# Patient Record
Sex: Female | Born: 1960 | Race: White | Hispanic: No | Marital: Married
Health system: Southern US, Community
[De-identification: ages and names within clinical notes are randomized; demographics above are authoritative.]

## PROBLEM LIST (undated history)

## (undated) DIAGNOSIS — Z5189 Encounter for other specified aftercare: Secondary | ICD-10-CM

## (undated) DIAGNOSIS — I10 Essential (primary) hypertension: Secondary | ICD-10-CM

## (undated) DIAGNOSIS — I350 Nonrheumatic aortic (valve) stenosis: Secondary | ICD-10-CM

## (undated) DIAGNOSIS — F419 Anxiety disorder, unspecified: Secondary | ICD-10-CM

## (undated) DIAGNOSIS — C50919 Malignant neoplasm of unspecified site of unspecified female breast: Secondary | ICD-10-CM

## (undated) DIAGNOSIS — Z923 Personal history of irradiation: Secondary | ICD-10-CM

## (undated) DIAGNOSIS — M1990 Unspecified osteoarthritis, unspecified site: Secondary | ICD-10-CM

## (undated) DIAGNOSIS — G709 Myoneural disorder, unspecified: Secondary | ICD-10-CM

## (undated) DIAGNOSIS — IMO0001 Reserved for inherently not codable concepts without codable children: Secondary | ICD-10-CM

## (undated) DIAGNOSIS — I219 Acute myocardial infarction, unspecified: Secondary | ICD-10-CM

## (undated) DIAGNOSIS — D649 Anemia, unspecified: Secondary | ICD-10-CM

## (undated) DIAGNOSIS — D259 Leiomyoma of uterus, unspecified: Secondary | ICD-10-CM

## (undated) DIAGNOSIS — F329 Major depressive disorder, single episode, unspecified: Secondary | ICD-10-CM

## (undated) DIAGNOSIS — E785 Hyperlipidemia, unspecified: Secondary | ICD-10-CM

## (undated) DIAGNOSIS — I5032 Chronic diastolic (congestive) heart failure: Secondary | ICD-10-CM

## (undated) DIAGNOSIS — G2581 Restless legs syndrome: Secondary | ICD-10-CM

## (undated) DIAGNOSIS — R011 Cardiac murmur, unspecified: Secondary | ICD-10-CM

## (undated) DIAGNOSIS — F32A Depression, unspecified: Secondary | ICD-10-CM

## (undated) DIAGNOSIS — Z87442 Personal history of urinary calculi: Secondary | ICD-10-CM

## (undated) DIAGNOSIS — K219 Gastro-esophageal reflux disease without esophagitis: Secondary | ICD-10-CM

## (undated) DIAGNOSIS — Z8719 Personal history of other diseases of the digestive system: Secondary | ICD-10-CM

## (undated) DIAGNOSIS — R519 Headache, unspecified: Secondary | ICD-10-CM

## (undated) DIAGNOSIS — Z6841 Body Mass Index (BMI) 40.0 and over, adult: Secondary | ICD-10-CM

## (undated) DIAGNOSIS — I2510 Atherosclerotic heart disease of native coronary artery without angina pectoris: Secondary | ICD-10-CM

## (undated) DIAGNOSIS — I2699 Other pulmonary embolism without acute cor pulmonale: Secondary | ICD-10-CM

## (undated) DIAGNOSIS — R0600 Dyspnea, unspecified: Secondary | ICD-10-CM

## (undated) DIAGNOSIS — Z9981 Dependence on supplemental oxygen: Secondary | ICD-10-CM

## (undated) DIAGNOSIS — L309 Dermatitis, unspecified: Secondary | ICD-10-CM

## (undated) DIAGNOSIS — R531 Weakness: Secondary | ICD-10-CM

## (undated) DIAGNOSIS — J449 Chronic obstructive pulmonary disease, unspecified: Secondary | ICD-10-CM

## (undated) DIAGNOSIS — G4730 Sleep apnea, unspecified: Secondary | ICD-10-CM

## (undated) DIAGNOSIS — N200 Calculus of kidney: Secondary | ICD-10-CM

## (undated) HISTORY — PX: JOINT REPLACEMENT: SHX530

## (undated) HISTORY — DX: Depression, unspecified: F32.A

## (undated) HISTORY — DX: Chronic diastolic (congestive) heart failure: I50.32

## (undated) HISTORY — DX: Major depressive disorder, single episode, unspecified: F32.9

## (undated) HISTORY — DX: Body Mass Index (BMI) 40.0 and over, adult: Z684

## (undated) HISTORY — PX: CARDIAC VALVE REPLACEMENT: SHX585

## (undated) HISTORY — DX: Gastro-esophageal reflux disease without esophagitis: K21.9

## (undated) HISTORY — PX: CHOLECYSTECTOMY: SHX55

## (undated) HISTORY — PX: CARPAL TUNNEL RELEASE: SHX101

## (undated) HISTORY — PX: CARDIAC CATHETERIZATION: SHX172

## (undated) HISTORY — PX: TONSILLECTOMY: SUR1361

## (undated) HISTORY — DX: Atherosclerotic heart disease of native coronary artery without angina pectoris: I25.10

## (undated) HISTORY — DX: Other pulmonary embolism without acute cor pulmonale: I26.99

## (undated) HISTORY — DX: Nonrheumatic aortic (valve) stenosis: I35.0

## (undated) HISTORY — PX: CORONARY ANGIOPLASTY: SHX604

## (undated) HISTORY — DX: Hyperlipidemia, unspecified: E78.5

## (undated) HISTORY — DX: Morbid (severe) obesity due to excess calories: E66.01

## (undated) HISTORY — PX: TUBAL LIGATION: SHX77

---

## 1993-12-29 HISTORY — PX: HERNIA REPAIR: SHX51

## 2000-01-11 ENCOUNTER — Emergency Department (HOSPITAL_COMMUNITY): Admission: EM | Admit: 2000-01-11 | Discharge: 2000-01-11 | Payer: Self-pay | Admitting: Emergency Medicine

## 2000-01-11 ENCOUNTER — Encounter: Payer: Self-pay | Admitting: Emergency Medicine

## 2000-01-14 ENCOUNTER — Other Ambulatory Visit: Admission: RE | Admit: 2000-01-14 | Discharge: 2000-01-14 | Payer: Self-pay | Admitting: Obstetrics & Gynecology

## 2001-02-07 ENCOUNTER — Emergency Department (HOSPITAL_COMMUNITY): Admission: EM | Admit: 2001-02-07 | Discharge: 2001-02-07 | Payer: Self-pay

## 2001-02-07 ENCOUNTER — Encounter: Payer: Self-pay | Admitting: Emergency Medicine

## 2001-12-28 ENCOUNTER — Observation Stay (HOSPITAL_COMMUNITY): Admission: RE | Admit: 2001-12-28 | Discharge: 2001-12-29 | Payer: Self-pay | Admitting: General Surgery

## 2002-05-05 ENCOUNTER — Other Ambulatory Visit: Admission: RE | Admit: 2002-05-05 | Discharge: 2002-05-05 | Payer: Self-pay | Admitting: Obstetrics & Gynecology

## 2002-06-07 ENCOUNTER — Emergency Department (HOSPITAL_COMMUNITY): Admission: EM | Admit: 2002-06-07 | Discharge: 2002-06-07 | Payer: Self-pay | Admitting: Podiatry

## 2003-01-02 ENCOUNTER — Other Ambulatory Visit: Admission: RE | Admit: 2003-01-02 | Discharge: 2003-01-02 | Payer: Self-pay | Admitting: Obstetrics & Gynecology

## 2003-07-04 ENCOUNTER — Emergency Department (HOSPITAL_COMMUNITY): Admission: EM | Admit: 2003-07-04 | Discharge: 2003-07-04 | Payer: Self-pay | Admitting: Emergency Medicine

## 2003-08-12 ENCOUNTER — Emergency Department (HOSPITAL_COMMUNITY): Admission: EM | Admit: 2003-08-12 | Discharge: 2003-08-12 | Payer: Self-pay | Admitting: Emergency Medicine

## 2003-08-12 ENCOUNTER — Encounter: Payer: Self-pay | Admitting: *Deleted

## 2004-01-09 ENCOUNTER — Encounter: Admission: RE | Admit: 2004-01-09 | Discharge: 2004-04-08 | Payer: Self-pay | Admitting: Family Medicine

## 2004-08-23 ENCOUNTER — Emergency Department (HOSPITAL_COMMUNITY): Admission: EM | Admit: 2004-08-23 | Discharge: 2004-08-23 | Payer: Self-pay | Admitting: Emergency Medicine

## 2004-08-23 IMAGING — CT CT ANGIO CHEST
3 of 7 series · 14 of 30 positions shown · IV contrast ([ID] omni 300)
Comparison: none

CLINICAL DATA: Chest tightness and shortness of breath.  History of hypertension and asthma.
 CHEST CT ANGIO WITH CONTRAST
TECHNIQUE: Multidetector CT imaging of the chest was performed according to the protocol for detection of pulmonary embolism during IV bolus injection of 140 ml Omnipaque 300.  Coronal and sagittal plane reformatted images were also generated.

[Series 3: pe w/ lower ext · axial · 0.70mm/px · z∈[-294,-66]mm · 10 of 228 slices shown]
[im 23/228  lung]
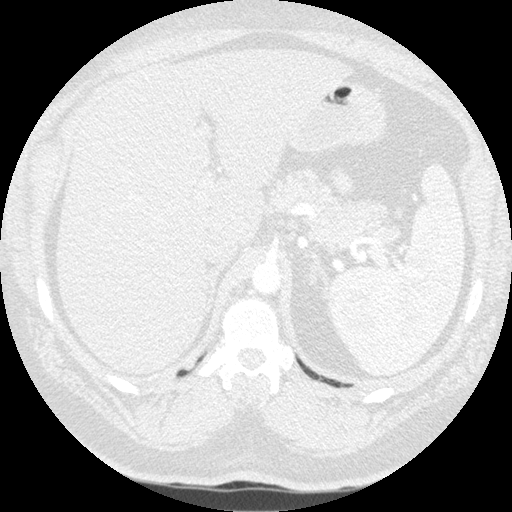
[im 46/228  mediastinal]
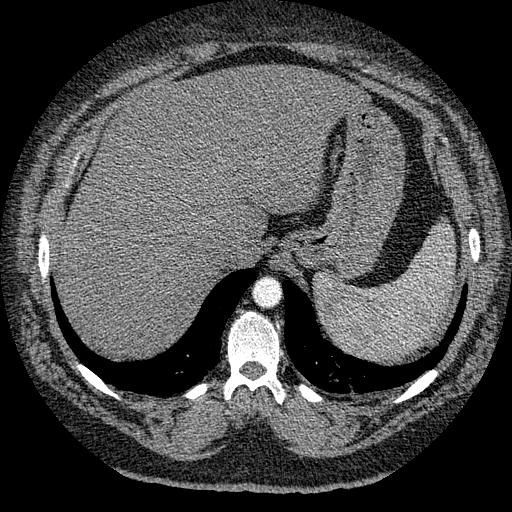
[im 69/228  lung]
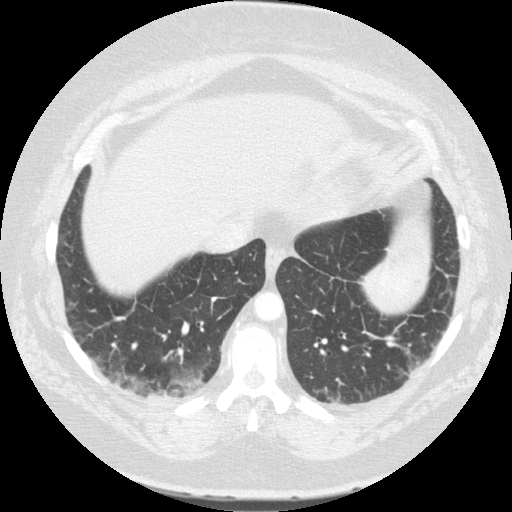
[im 91/228  mediastinal]
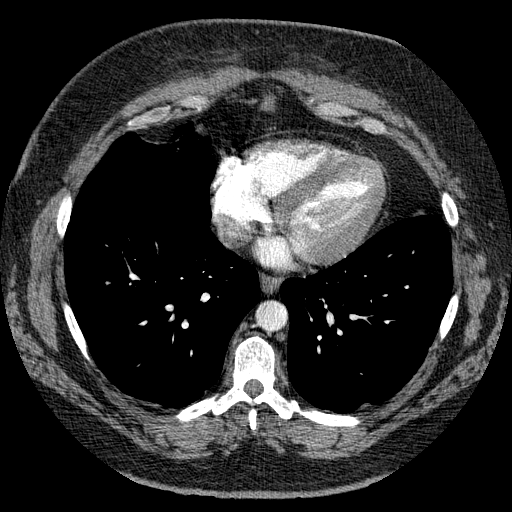
[im 114/228  lung]
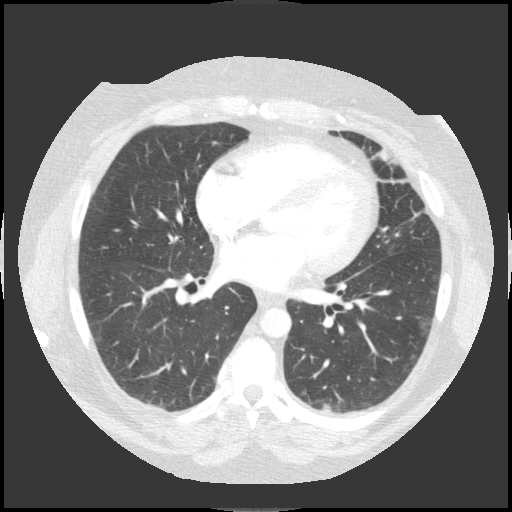
[im 122/228  mediastinal]
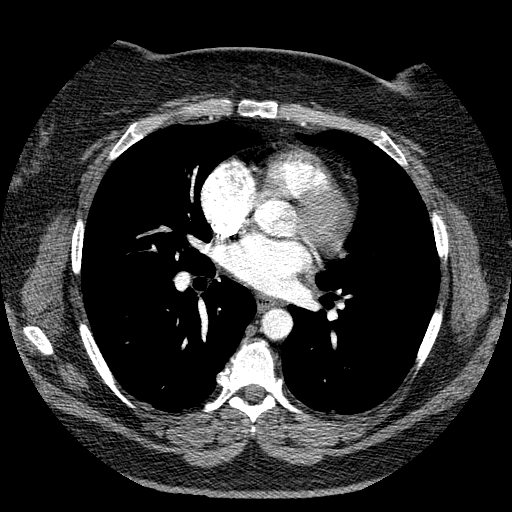
[im 137/228  lung]
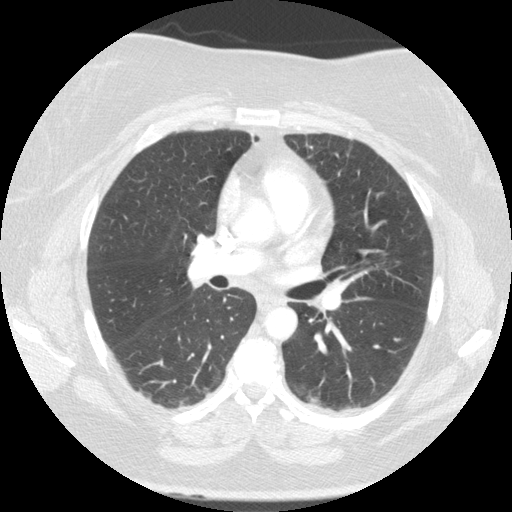
[im 159/228  mediastinal]
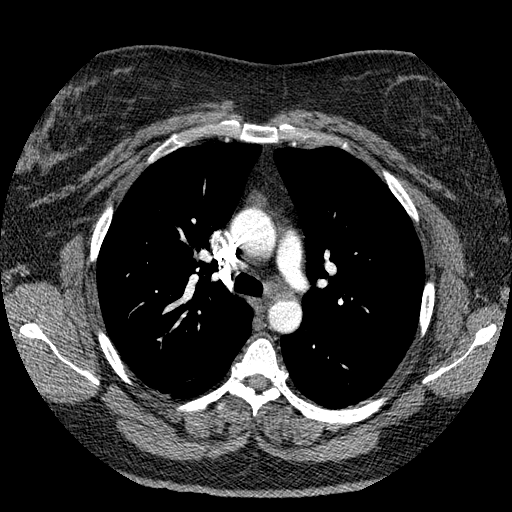
[im 182/228  lung]
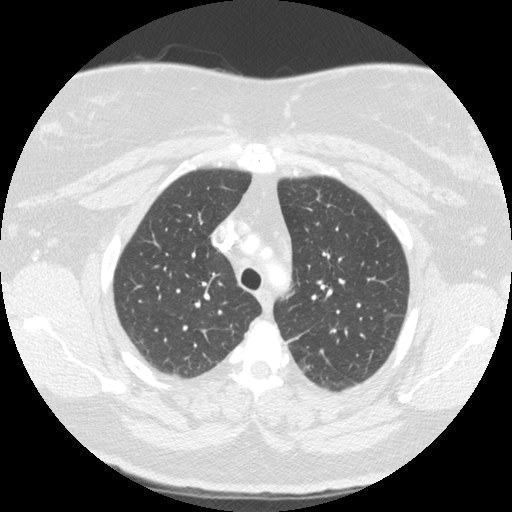
[im 205/228  mediastinal]
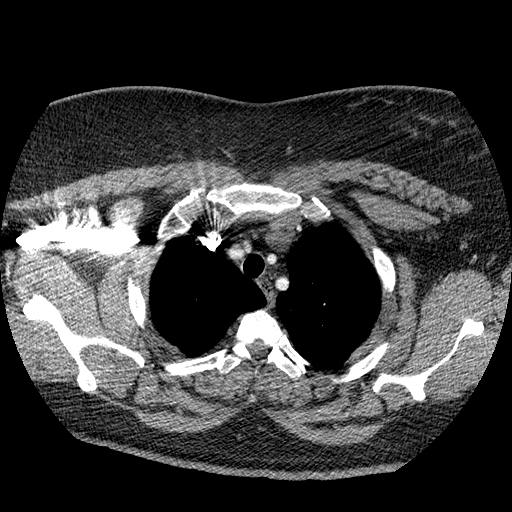

[Series 301: reformatted · sagittal · 0.70mm/px · 2 of 75 slices shown (1 of 2)]
[im 25/75  lung]
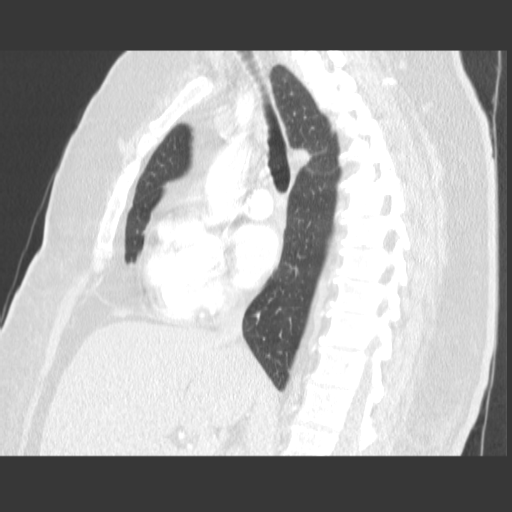
[im 50/75  lung]
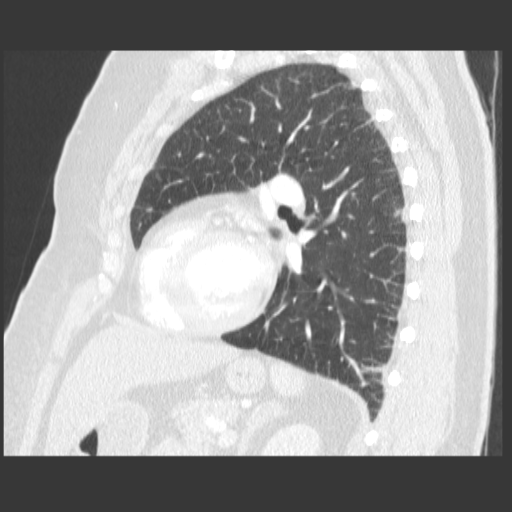

[Series 302: reformatted · coronal · 0.70mm/px · 2 of 75 slices shown (2 of 2)]
[im 25/75  lung]
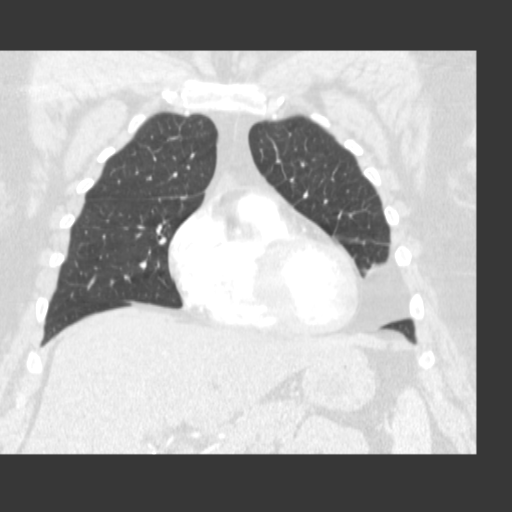
[im 50/75  lung]
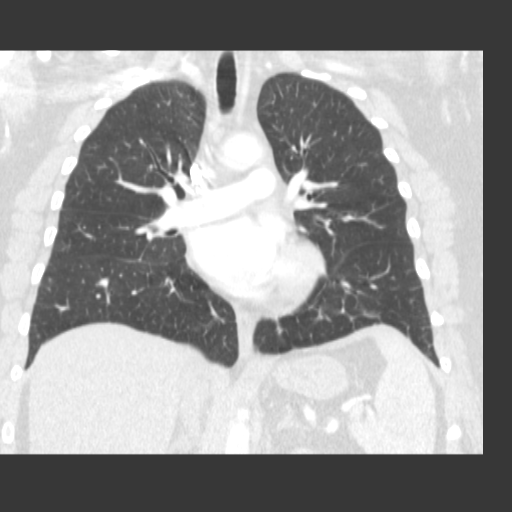

[14 of 30 positions shown; findings below may reference images not displayed]

FINDINGS: There is good contrast opacification of the pulmonary artery and its branches.  No filling defects to suggest acute PE.  There is no pleural or pericardial effusion.  Normal size prevascular and subcarinal lymph nodes.  There is minimal patchy ground-glass opacity posteriorly in both lower lobes which may represent some dependent atelectasis.  There is a focal area of atelectasis in the lingula.  The lungs are otherwise clear.  Coronal and sagittal reconstructions confirm the above findings.
 IMPRESSION
 1.  Negative for acute PE.
 2.  Dependent and lingular atelectasis.

## 2004-08-23 IMAGING — CR DG CHEST 1V PORT
1 series · 1 of 1 positions shown · non-contrast
Comparison: none

CLINICAL DATA: Shortness of breath.  
 PORTABLE CHEST [DATE] AT [G9] HOURS
 There is no previous for comparison.  Heart size upper limits normal.  Patchy subsegmental atelectasis or infiltrates in both lung bases right greater than left.  No overt interstitial edema.  No effusion is apparent. 
 IMPRESSION
 Patchy bibasilar infiltrates or atelectasis.  
 Borderline cardiomegaly.

[view not recorded]
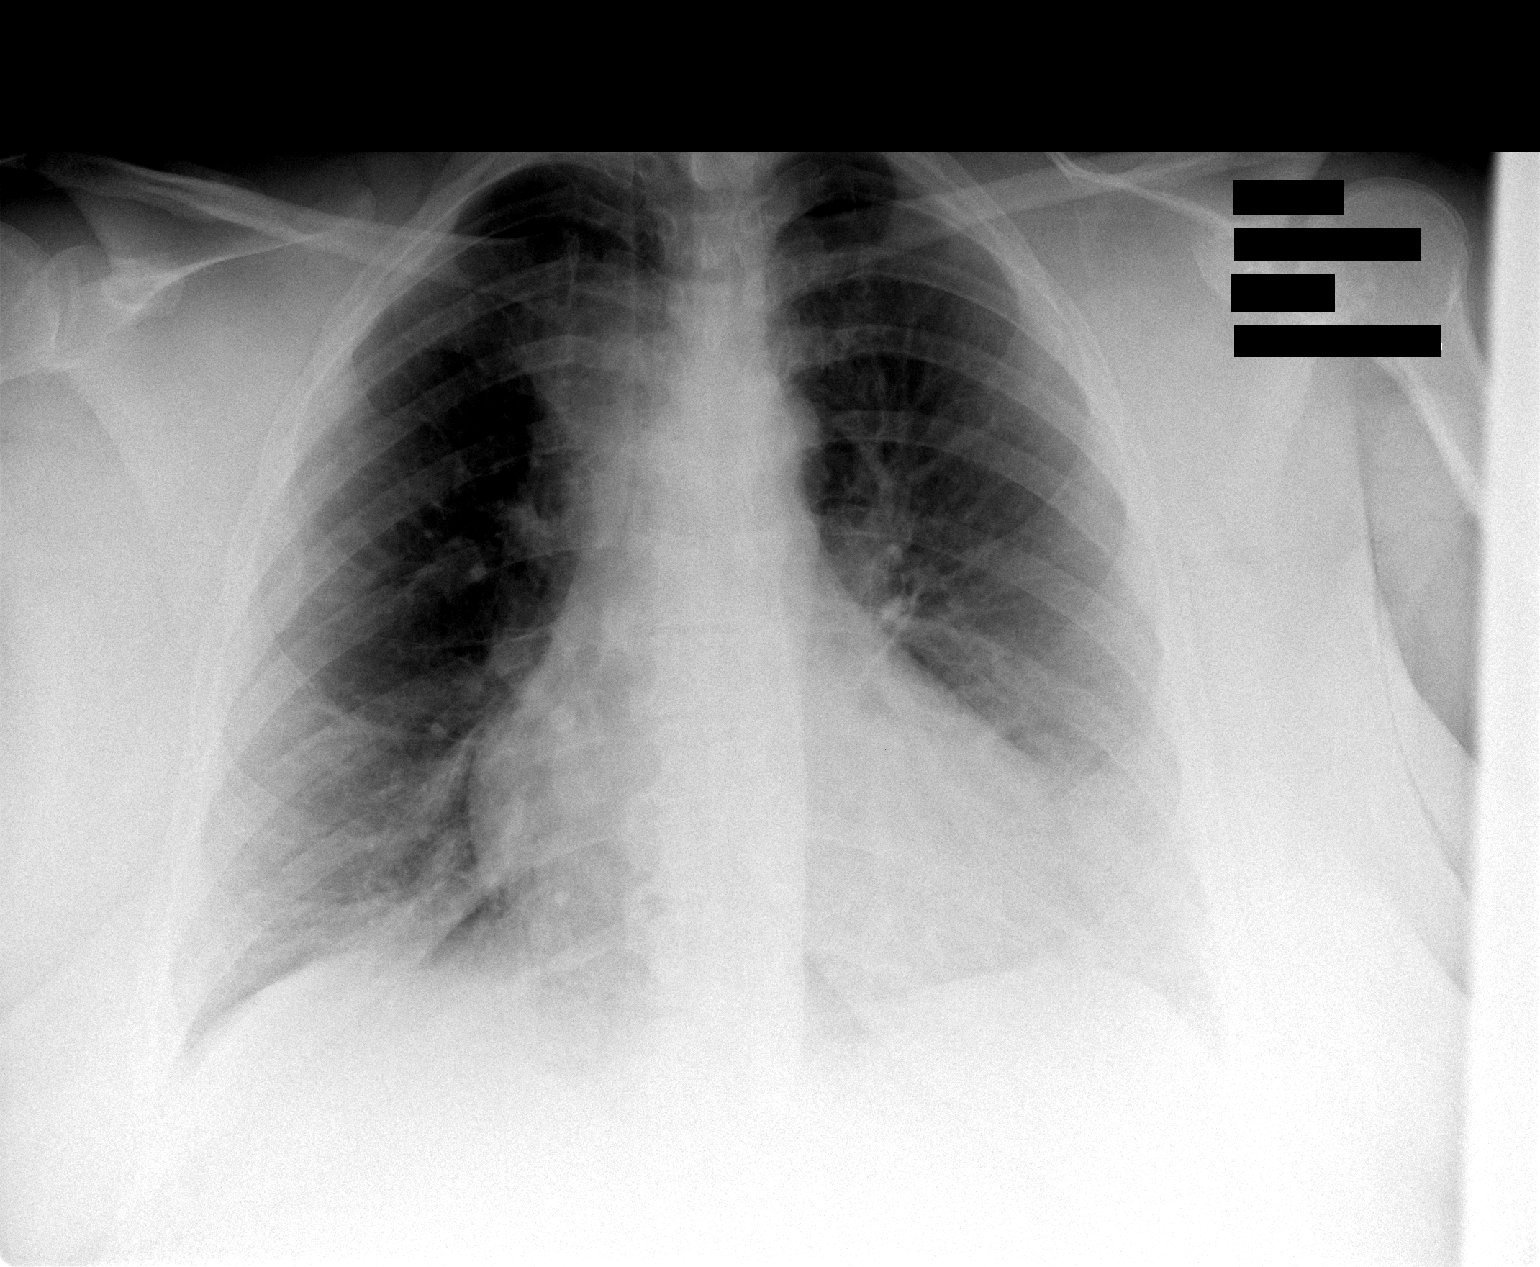

[1 of 1 positions shown; findings below may reference images not displayed]

## 2005-02-05 ENCOUNTER — Other Ambulatory Visit: Admission: RE | Admit: 2005-02-05 | Discharge: 2005-02-05 | Payer: Self-pay | Admitting: Obstetrics & Gynecology

## 2006-05-09 ENCOUNTER — Emergency Department (HOSPITAL_COMMUNITY): Admission: EM | Admit: 2006-05-09 | Discharge: 2006-05-09 | Payer: Self-pay | Admitting: Family Medicine

## 2006-05-09 IMAGING — CR DG ELBOW COMPLETE 3+V*L*
4 series · 4 of 4 positions shown · non-contrast
Comparison: none

CLINICAL DATA: Left elbow injury.  Heard a pop in arm.  Pain mid biceps down to the fingers.  Difficulty extending elbow.
LEFT ELBOW ? 4 VIEW:

[view not recorded (1 of 4)]
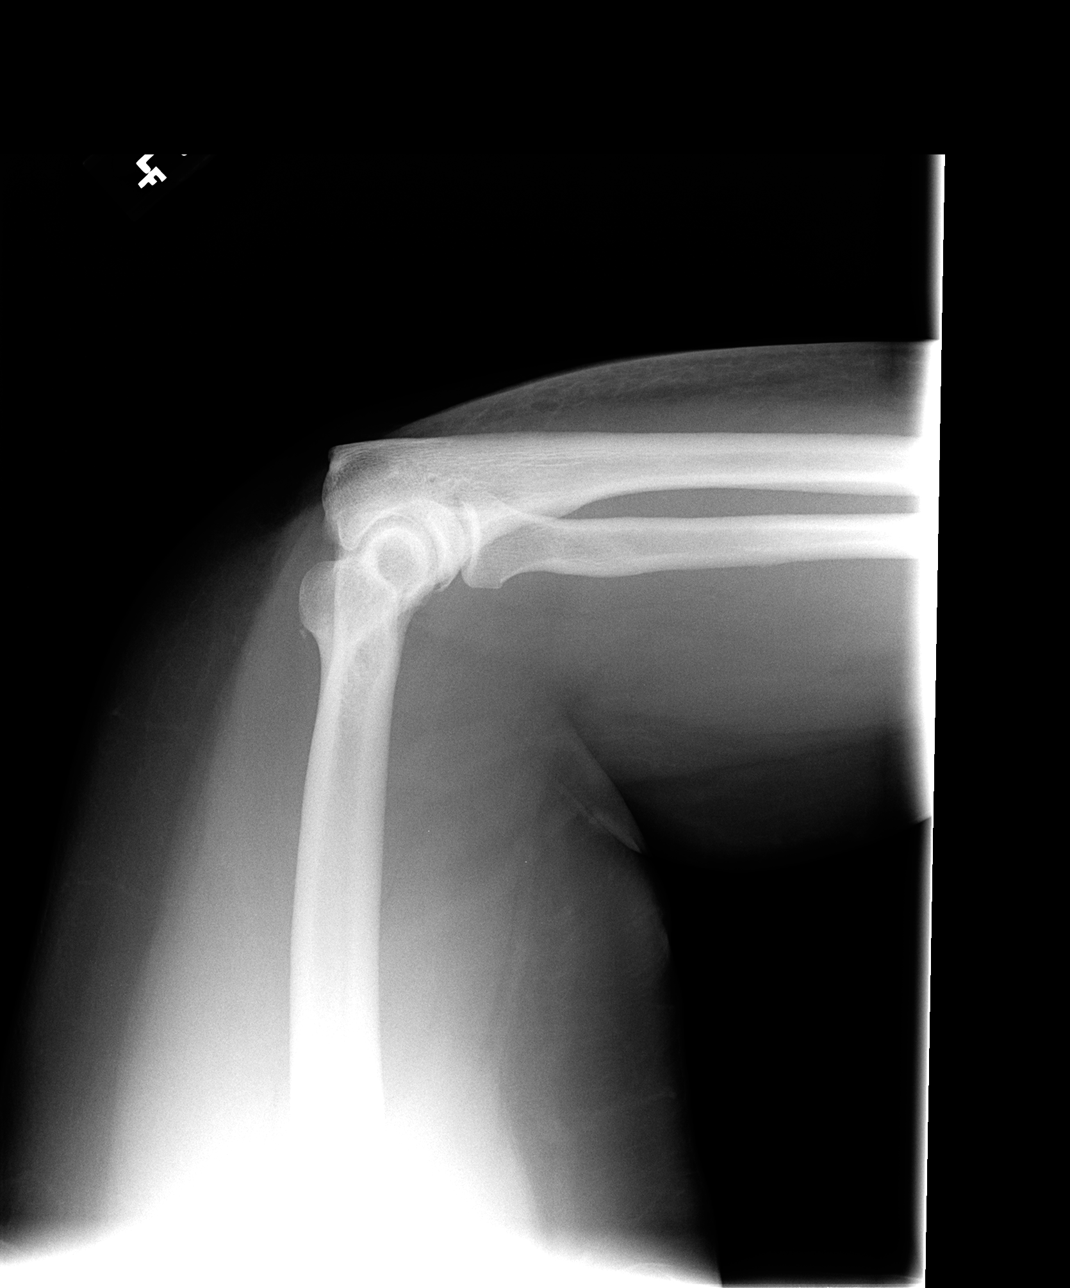

[view not recorded (2 of 4)]
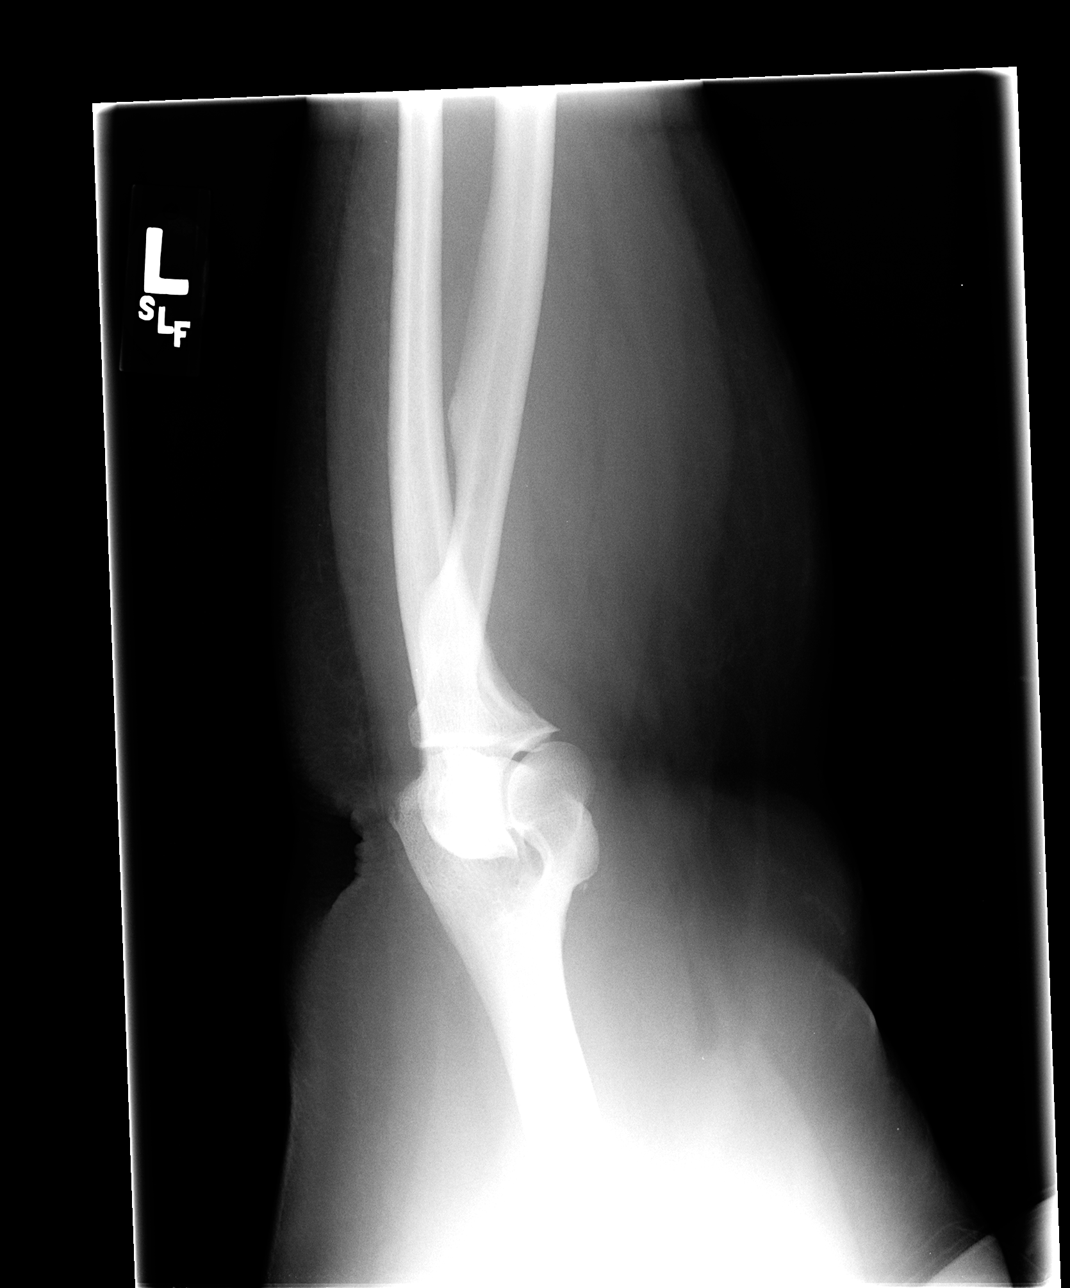

[view not recorded (3 of 4)]
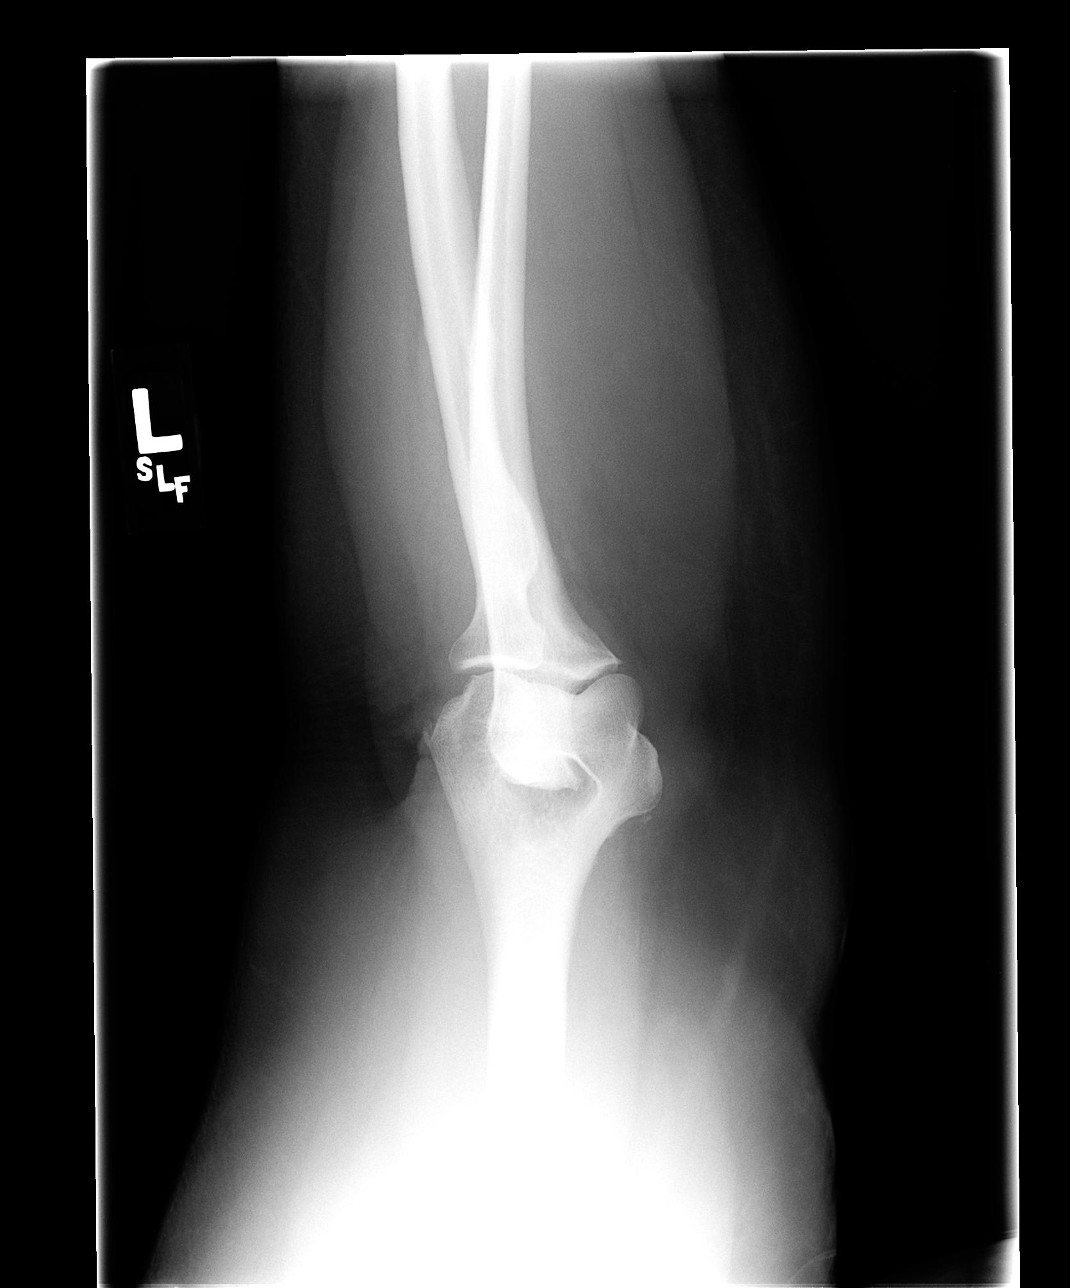

[view not recorded (4 of 4)]
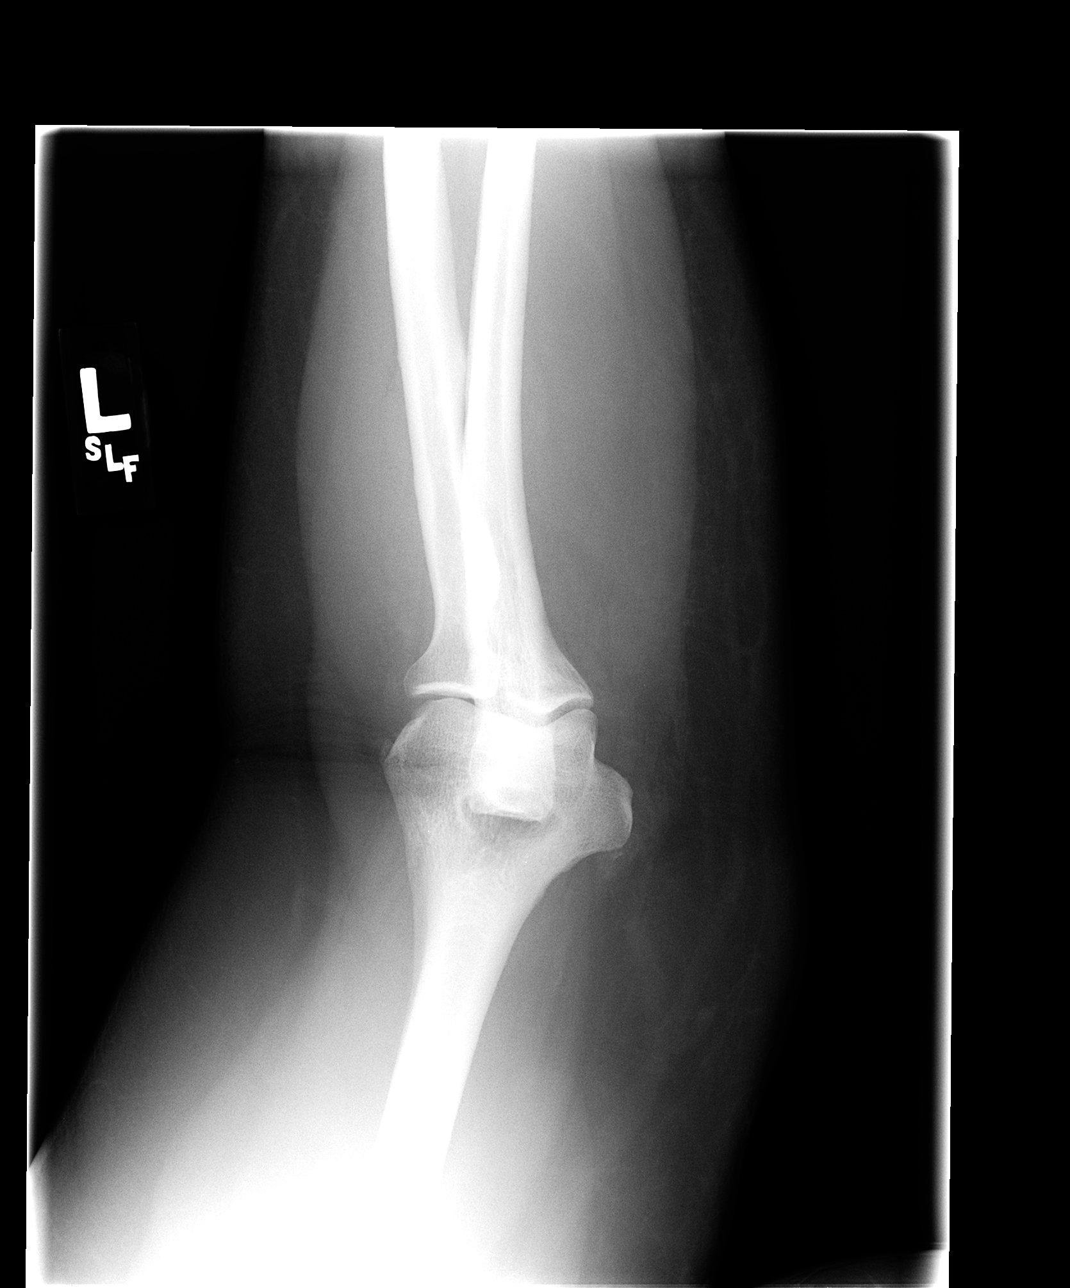

[4 of 4 positions shown; findings below may reference images not displayed]

FINDINGS: Minute bony density adjacent to the proximal aspect of the medial humeral epicondyle may potentially represent a small cortical avulsion.  No definite joint effusion.  No fracture line.
IMPRESSION: Query subtle cortical avulsion at the medial humeral epicondylar site and also possibly posterior aspect of the distal humerus adjacent to the tuberosity.

## 2006-05-22 ENCOUNTER — Emergency Department (HOSPITAL_COMMUNITY): Admission: EM | Admit: 2006-05-22 | Discharge: 2006-05-23 | Payer: Self-pay | Admitting: Emergency Medicine

## 2006-05-22 IMAGING — CR DG CHEST 2V
2 series · 2 of 2 positions shown · non-contrast
Comparison: [DATE].

CLINICAL DATA: 44 year old with pneumonia, shortness of breath, hypertension, and asthma.
 CHEST - 2 VIEW ? [DATE]:

[w chest pa]
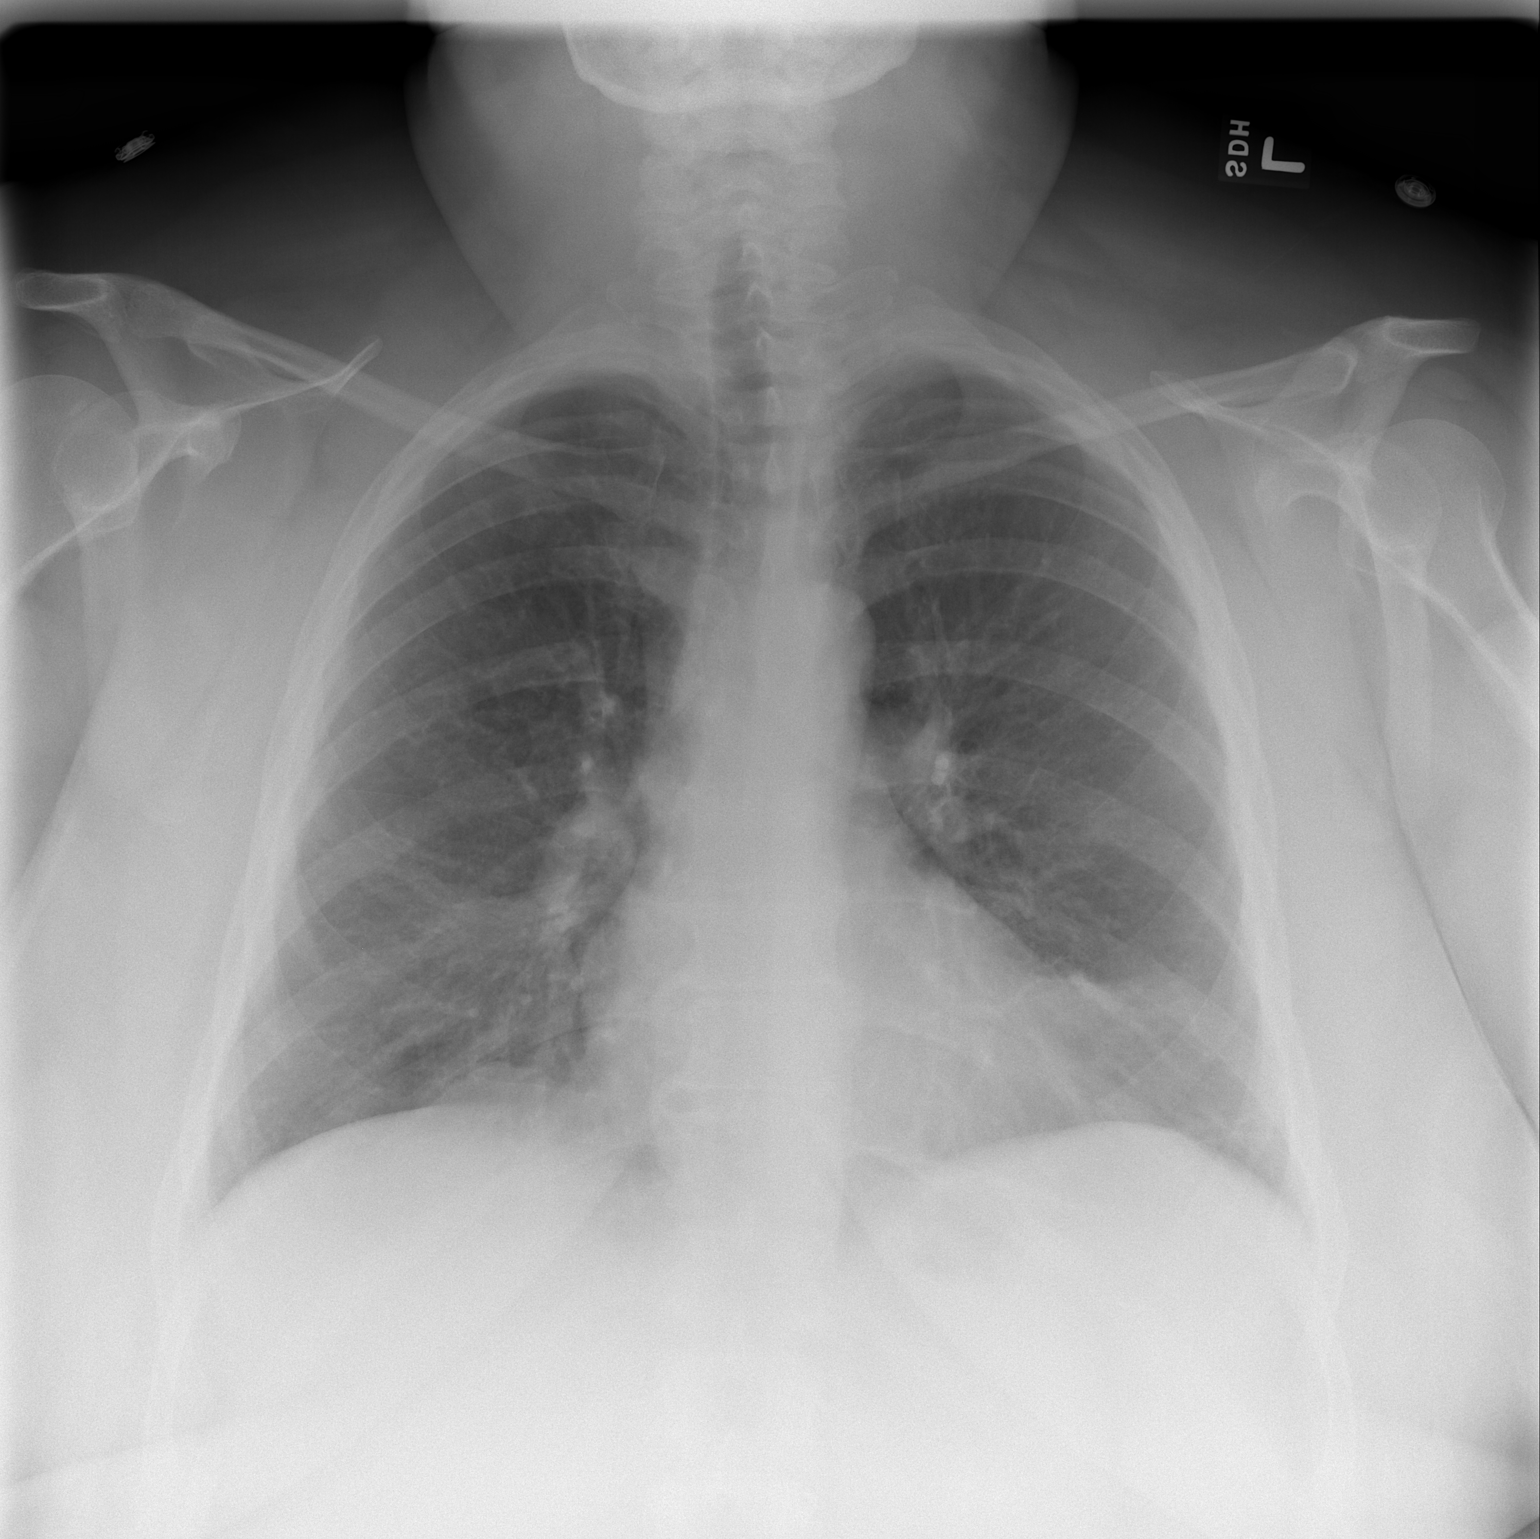

[w chest lat]
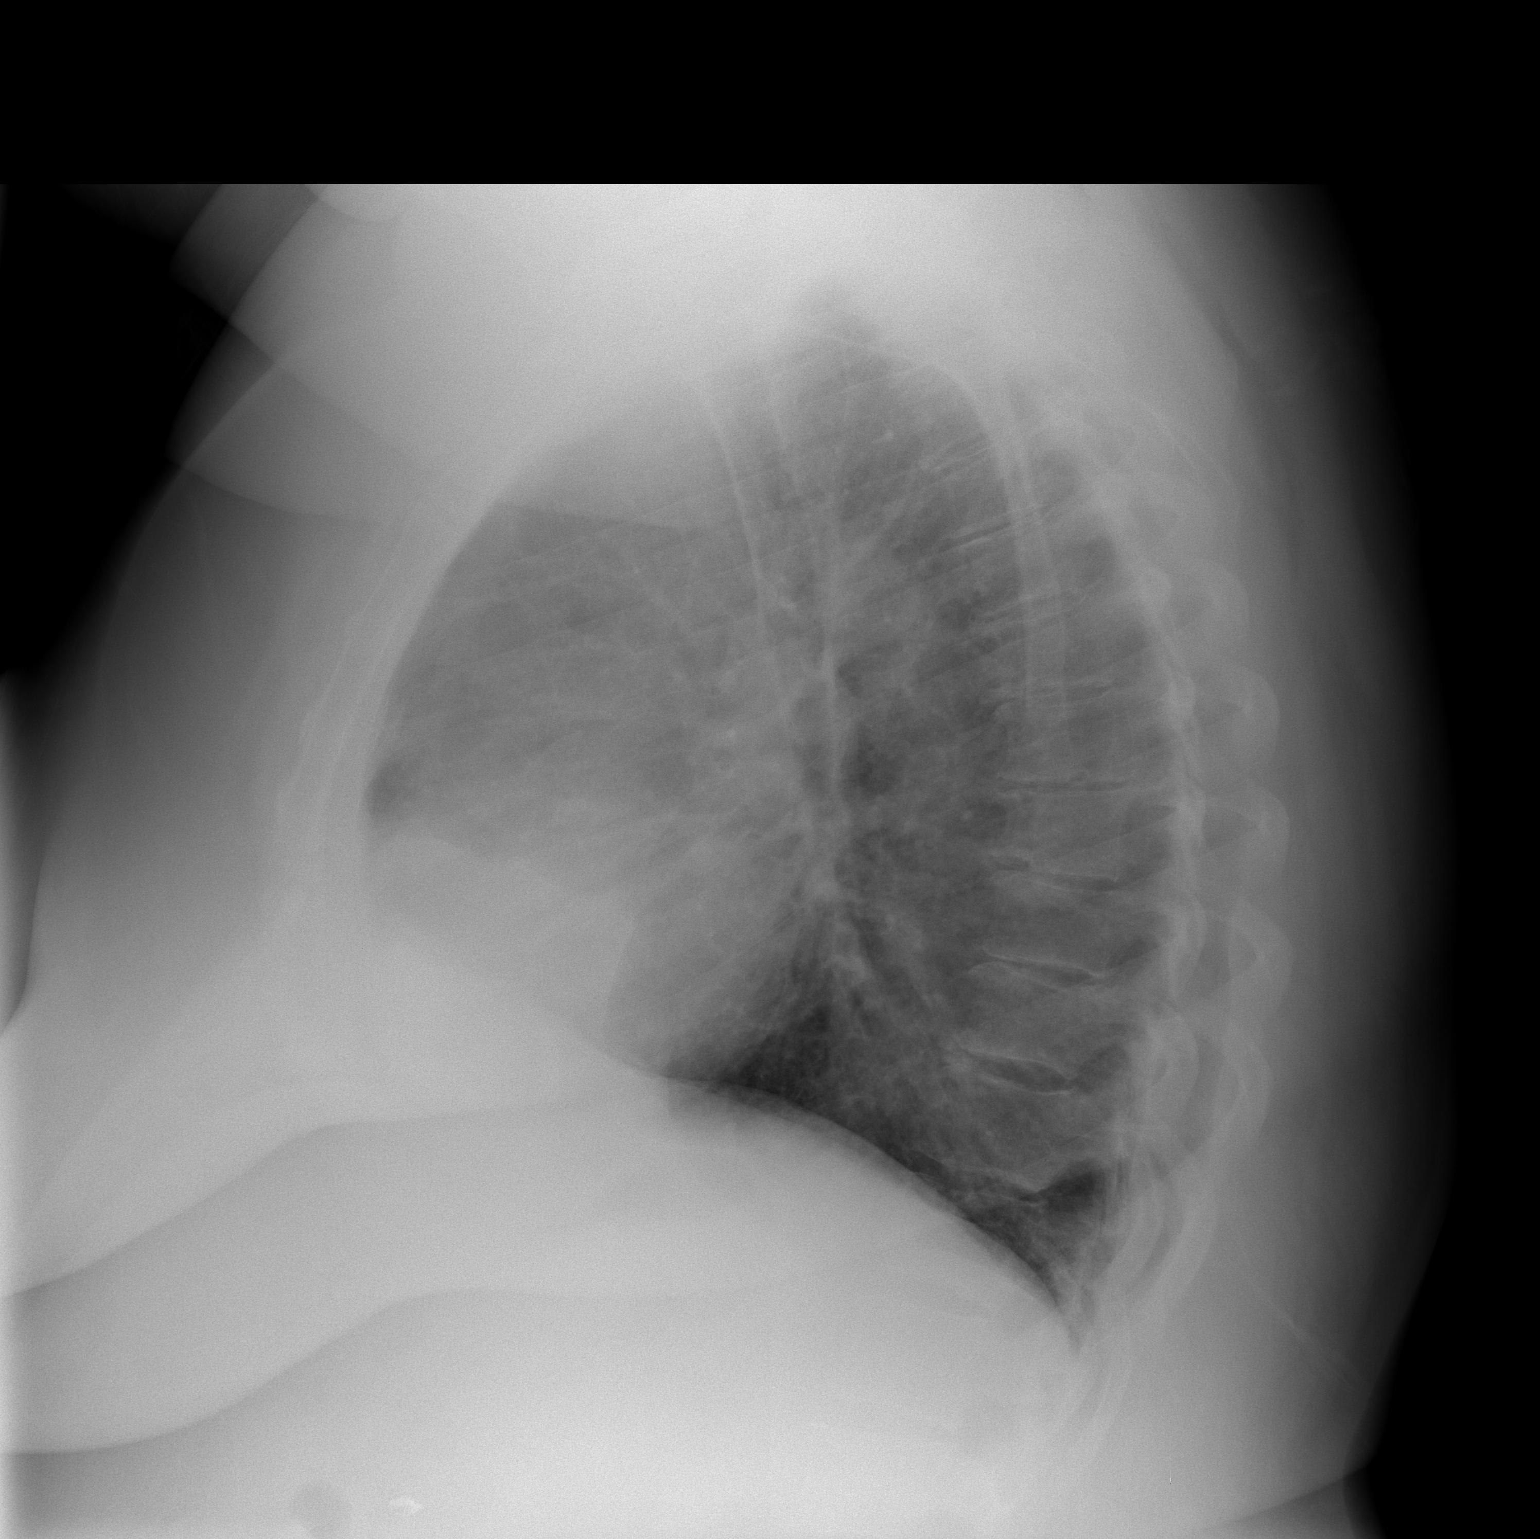

[2 of 2 positions shown; findings below may reference images not displayed]

FINDINGS: The lungs are well inflated but not hyperinflated.  The heart is normal in size.  There is minimal left lower lobe atelectasis.  No focal consolidations or pleural effusions are identified.  Mild perihilar bronchitic changes are noted.
IMPRESSION: 1.  Bronchitic changes.
 2.  Left lower lobe atelectasis.

## 2006-12-22 ENCOUNTER — Emergency Department (HOSPITAL_COMMUNITY): Admission: EM | Admit: 2006-12-22 | Discharge: 2006-12-22 | Payer: Self-pay | Admitting: Emergency Medicine

## 2006-12-22 IMAGING — CT CT PELVIS W/O CM
3 series · 12 of 36 positions shown, 19 images · IV contrast (agent unspecified)
Comparison: none

CLINICAL DATA: Left-sided flank pain and renal colic.  History of renal calculi.  
 ABDOMEN CT WITHOUT CONTRAST:
TECHNIQUE: Multidetector CT imaging of the abdomen was performed following the standard protocol without IV contrast.
TECHNIQUE: Multidetector CT imaging of the pelvis was performed following the standard protocol without IV contrast.

[Series 2: renal stone · axial · 0.96mm/px · z∈[-449,-89]mm · 9 of 92 slices shown, 15 images]
[im 10/92  soft-tissue]
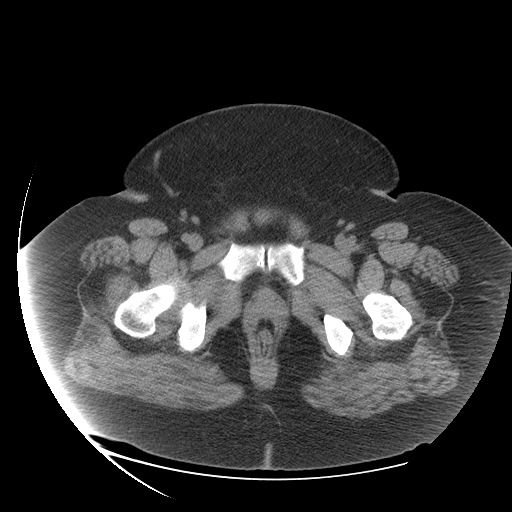
[im 10/92  bone]
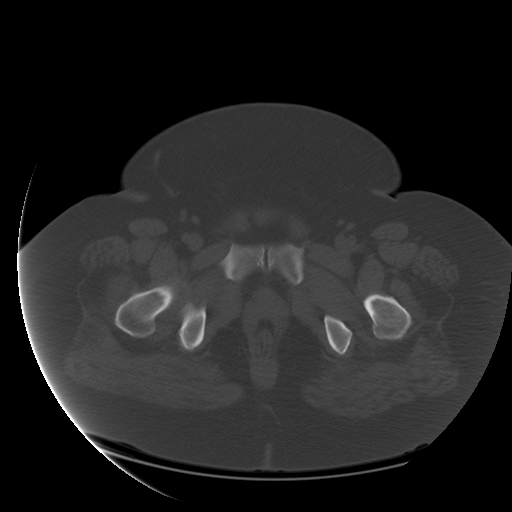
[im 19/92  soft-tissue]
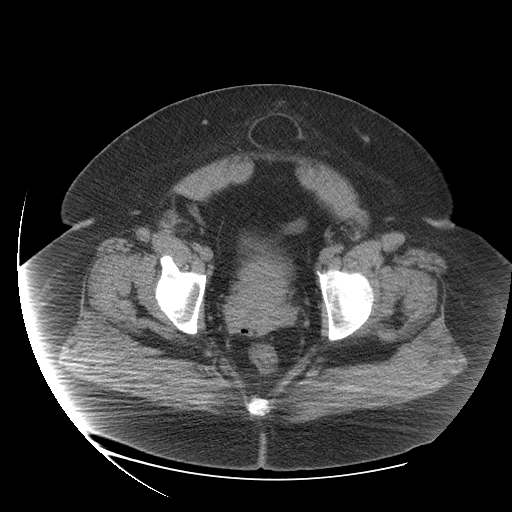
[im 28/92  soft-tissue]
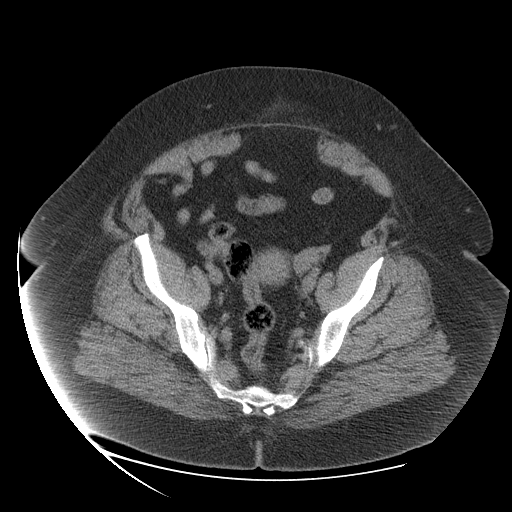
[im 37/92  soft-tissue]
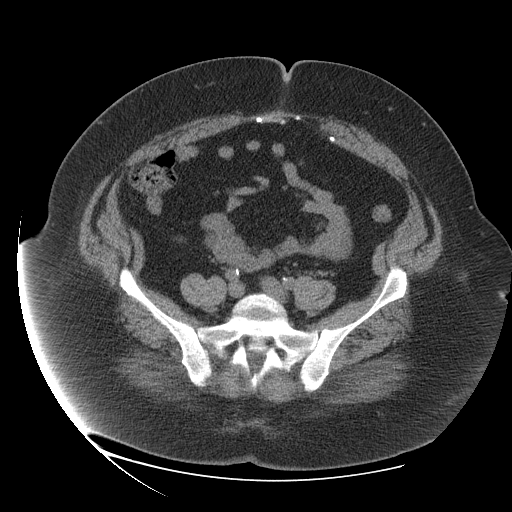
[im 46/92  soft-tissue]
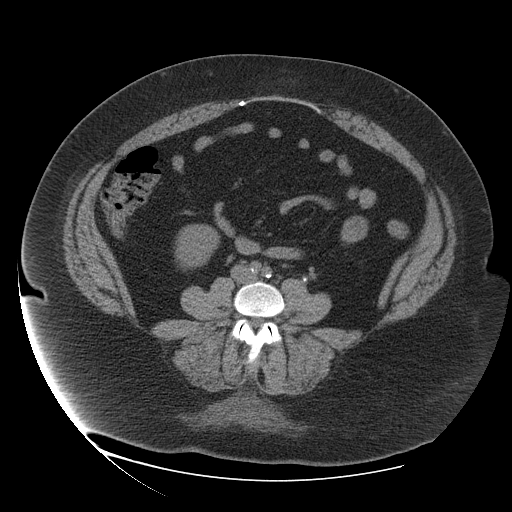
[im 55/92  soft-tissue]
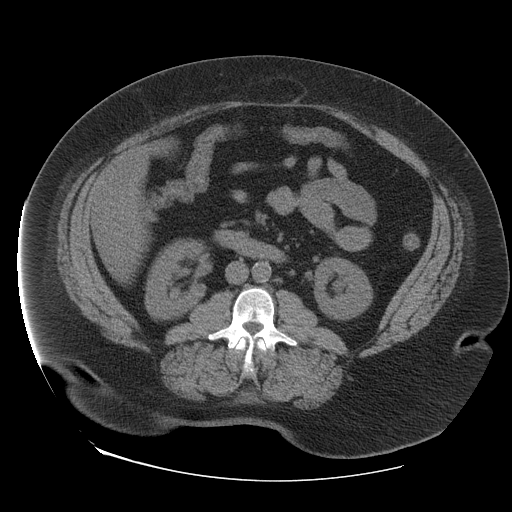
[im 55/92  lung]
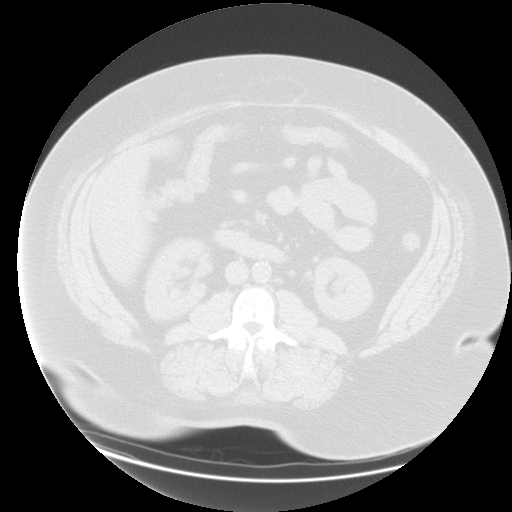
[im 64/92  soft-tissue]
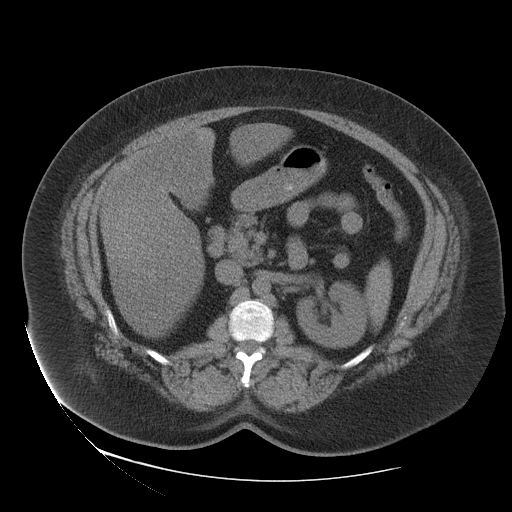
[im 64/92  lung]
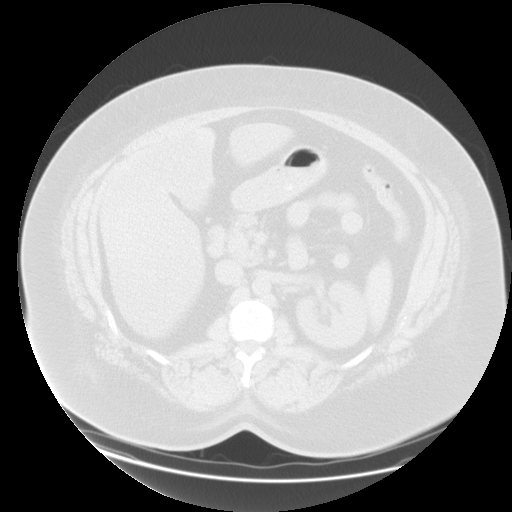
[im 73/92  soft-tissue]
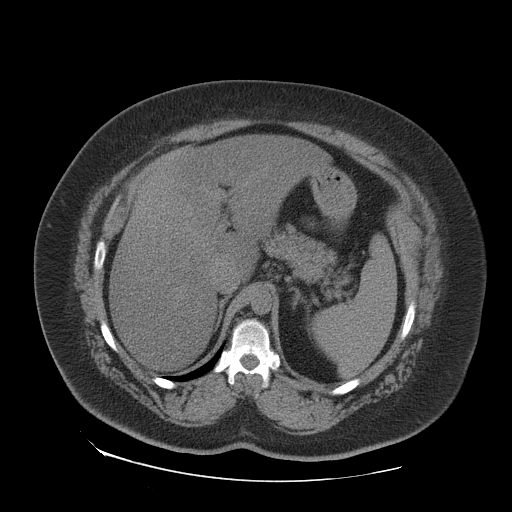
[im 73/92  lung]
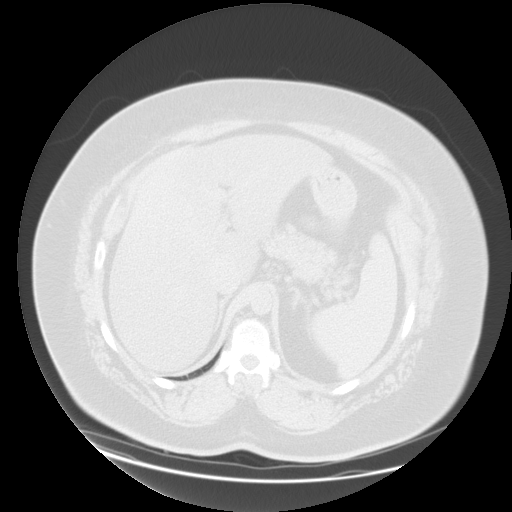
[im 82/92  soft-tissue]
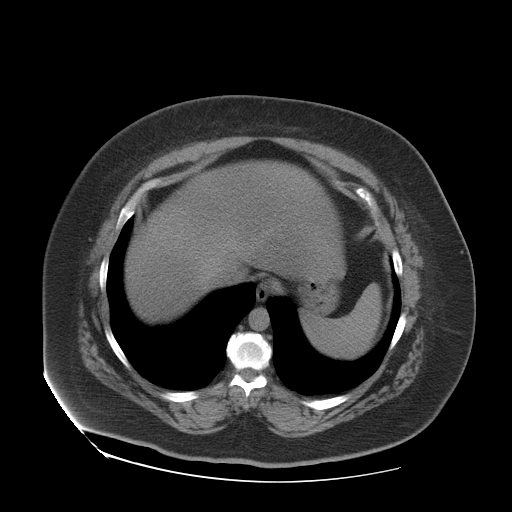
[im 82/92  lung]
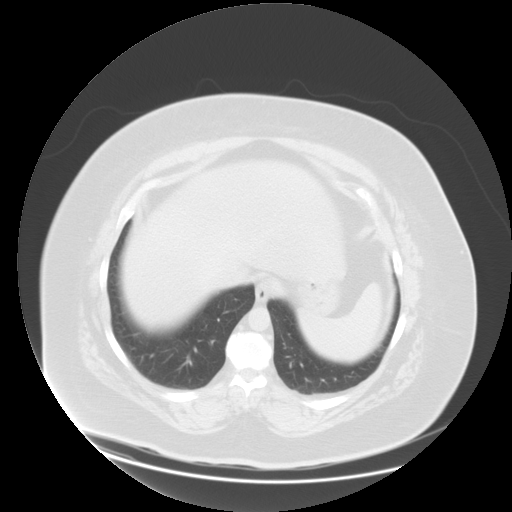
[im 82/92  bone]
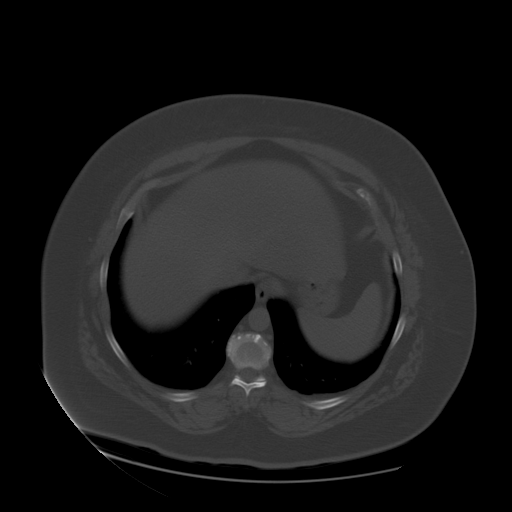

[Series 300: sag · sagittal · 0.96mm/px · 1 of 222 slices shown, 2 images]
[im 74/222  soft-tissue]
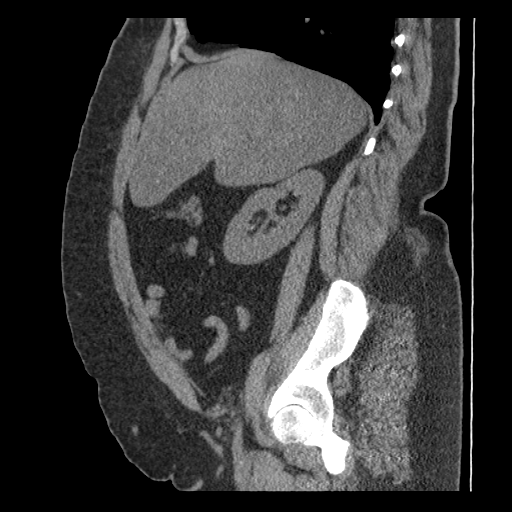
[im 74/222  bone]
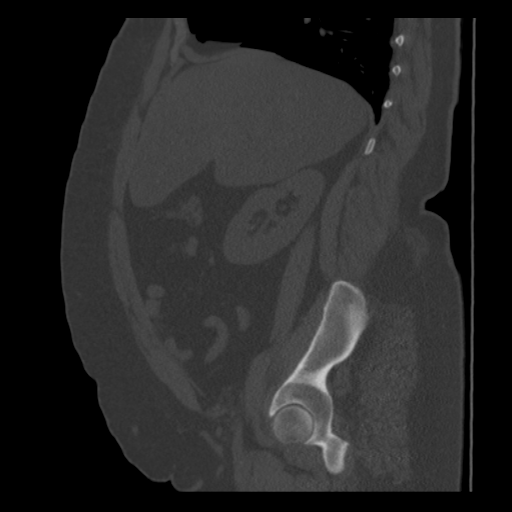

[Series 301: cor · coronal · 0.96mm/px · 2 of 185 slices shown]
[im 19/185  soft-tissue]
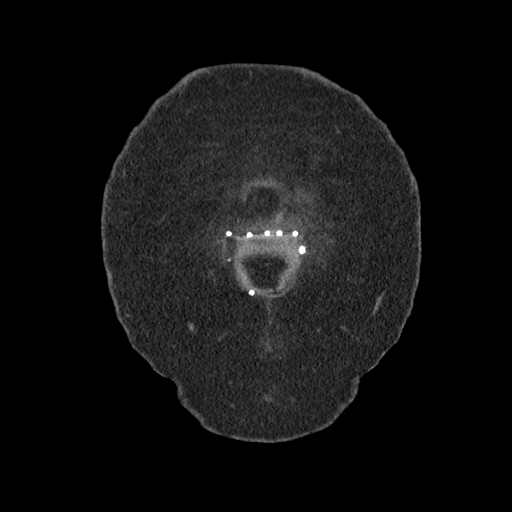
[im 37/185  soft-tissue]
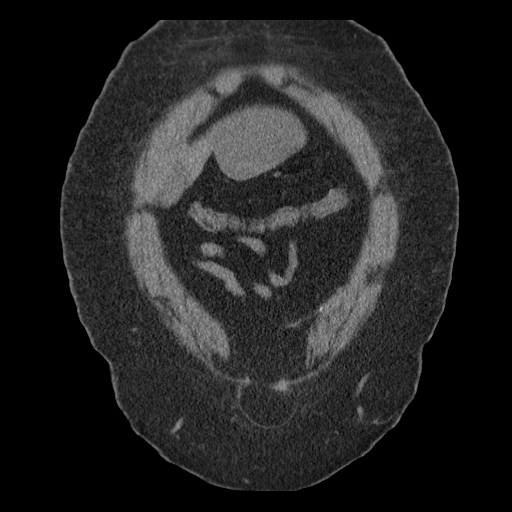

[12 of 36 positions shown; findings below may reference images not displayed]

FINDINGS: Mild left hydronephrosis is seen.  A calculus is seen in the proximal left ureter just above the level of the iliac wing which measures 3 mm.  A tiny calculus is noted in the lower pole of the right kidney.  However, there is no evidence of right-sided hydronephrosis.  Both kidneys are normal in contour.
 Diffuse fatty infiltration of the liver is noted.  Surgical clips are seen from prior cholecystectomy.  The abdominal parenchymal organs are otherwise unremarkable in appearance on this noncontrast study.  Surgical mesh is seen in the anterior abdominal wall.  There is a small supraumbilical anterior abdominal wall hernia containing fat just above the level of the surgical mesh.  There is no evidence of herniated bowel.
IMPRESSION: 1.  3 mm calculus in the proximal left ureter just above the level of the iliac crest.  This results in mild left hydronephrosis.  
 2.  Small midline anterior abdominal hernia containing only fat, located just above level of surgical mesh.
 3.  Diffuse fatty infiltration of the liver.  
 PELVIS CT WITHOUT CONTRAST:
FINDINGS: There is no evidence of distal ureteral calculi or dilatation.  No calculi are seen in the bladder.  There is no evidence of pelvic mass.  There is no evidence of inflammatory process or abnormal fluid collections.  A midline infraumbilical anterior abdominal wall hernia is seen containing fat, but no evidence of herniated bowel loops.
IMPRESSION: Small infraumbilical anterior abdominal wall hernia containing only fat.  No acute findings.

## 2007-03-20 ENCOUNTER — Emergency Department (HOSPITAL_COMMUNITY): Admission: EM | Admit: 2007-03-20 | Discharge: 2007-03-21 | Payer: Self-pay | Admitting: Emergency Medicine

## 2007-03-20 IMAGING — CR DG CHEST 2V
2 series · 2 of 2 positions shown · non-contrast
Comparison: [DATE]

CLINICAL DATA: Short of breath. 
 CHEST - 2 VIEW:

[w chest pa *]
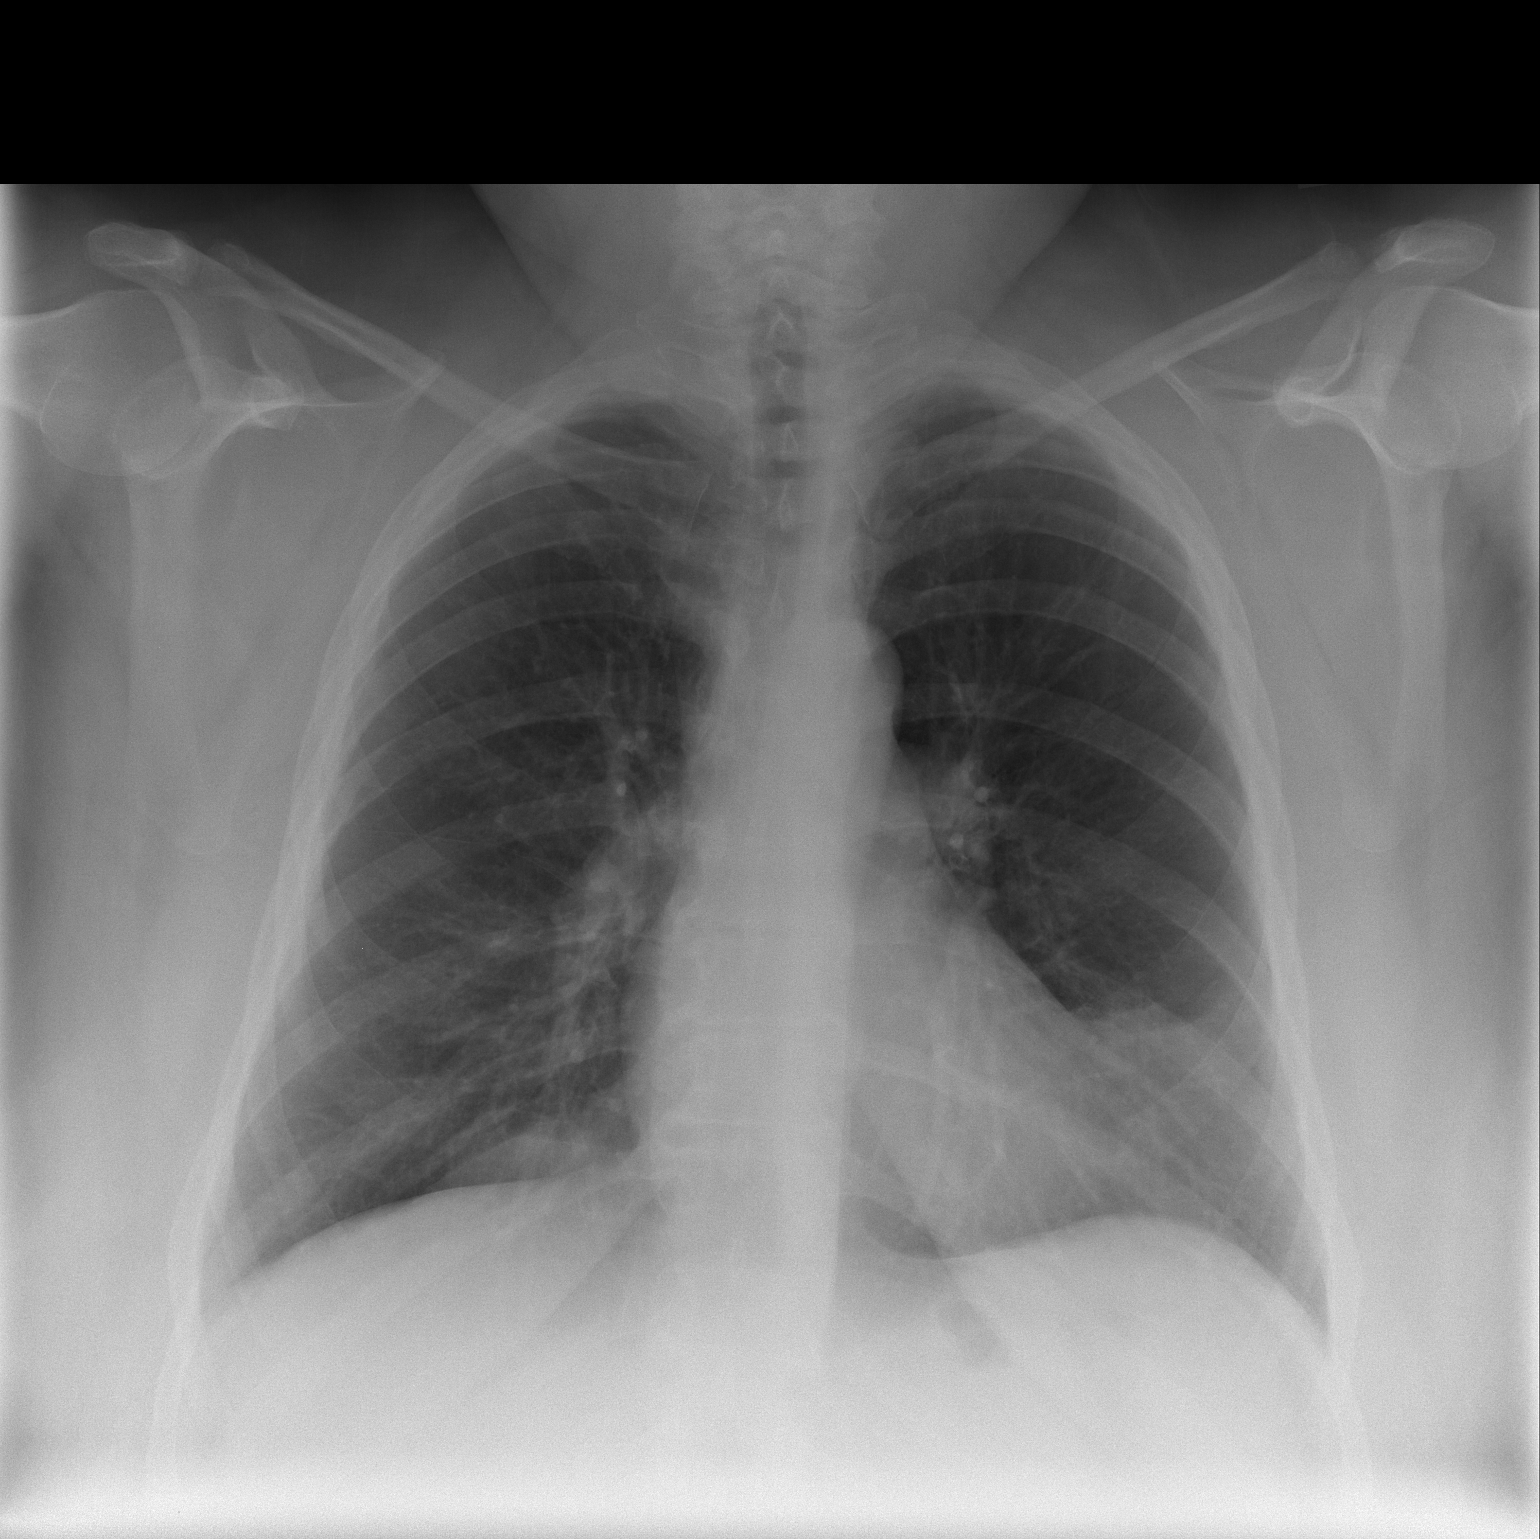

[w chest lat *]
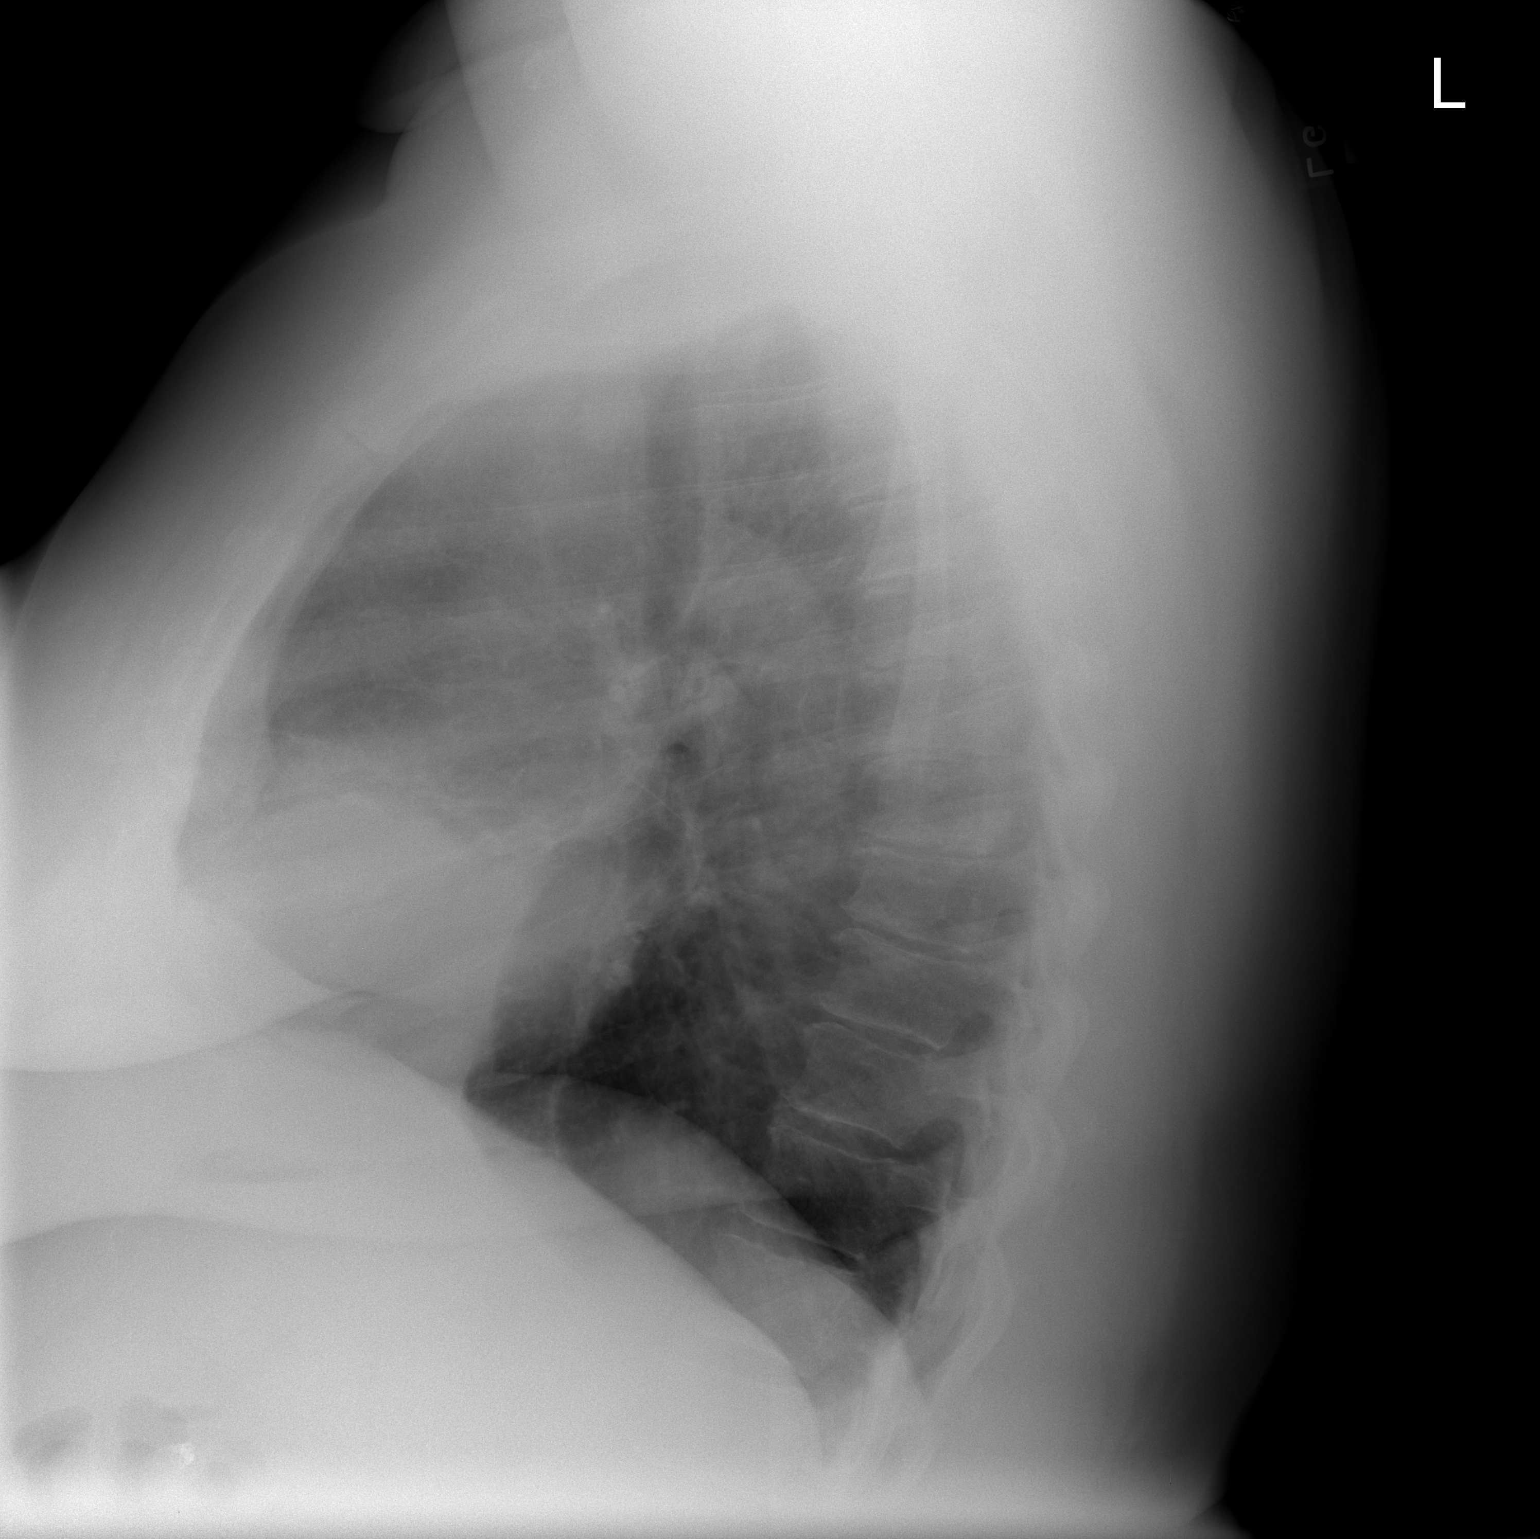

[2 of 2 positions shown; findings below may reference images not displayed]

FINDINGS: Two views of the chest show no definite pneumonia. A vague opacity at the left lung base on the frontal view appears unchanged compared to prior chest x-ray and may represent scarring in the region of the lingula. No effusion is seen. The heart is within normal limits in size.
IMPRESSION: No pneumonia. Mild peribronchial thickening.

## 2008-01-08 ENCOUNTER — Emergency Department (HOSPITAL_COMMUNITY): Admission: EM | Admit: 2008-01-08 | Discharge: 2008-01-08 | Payer: Self-pay | Admitting: Emergency Medicine

## 2008-01-08 IMAGING — CR DG KNEE COMPLETE 4+V*R*
4 series · 4 of 4 positions shown · non-contrast
Comparison: none

CLINICAL DATA: Fall.  
 RIGHT KNEE - 4 VIEW:
 Normal alignment.  No fracture.  There is moderate to advanced degenerative change most prominent in the medial compartment with cartilage loss and spurring.

[t knee ap right]
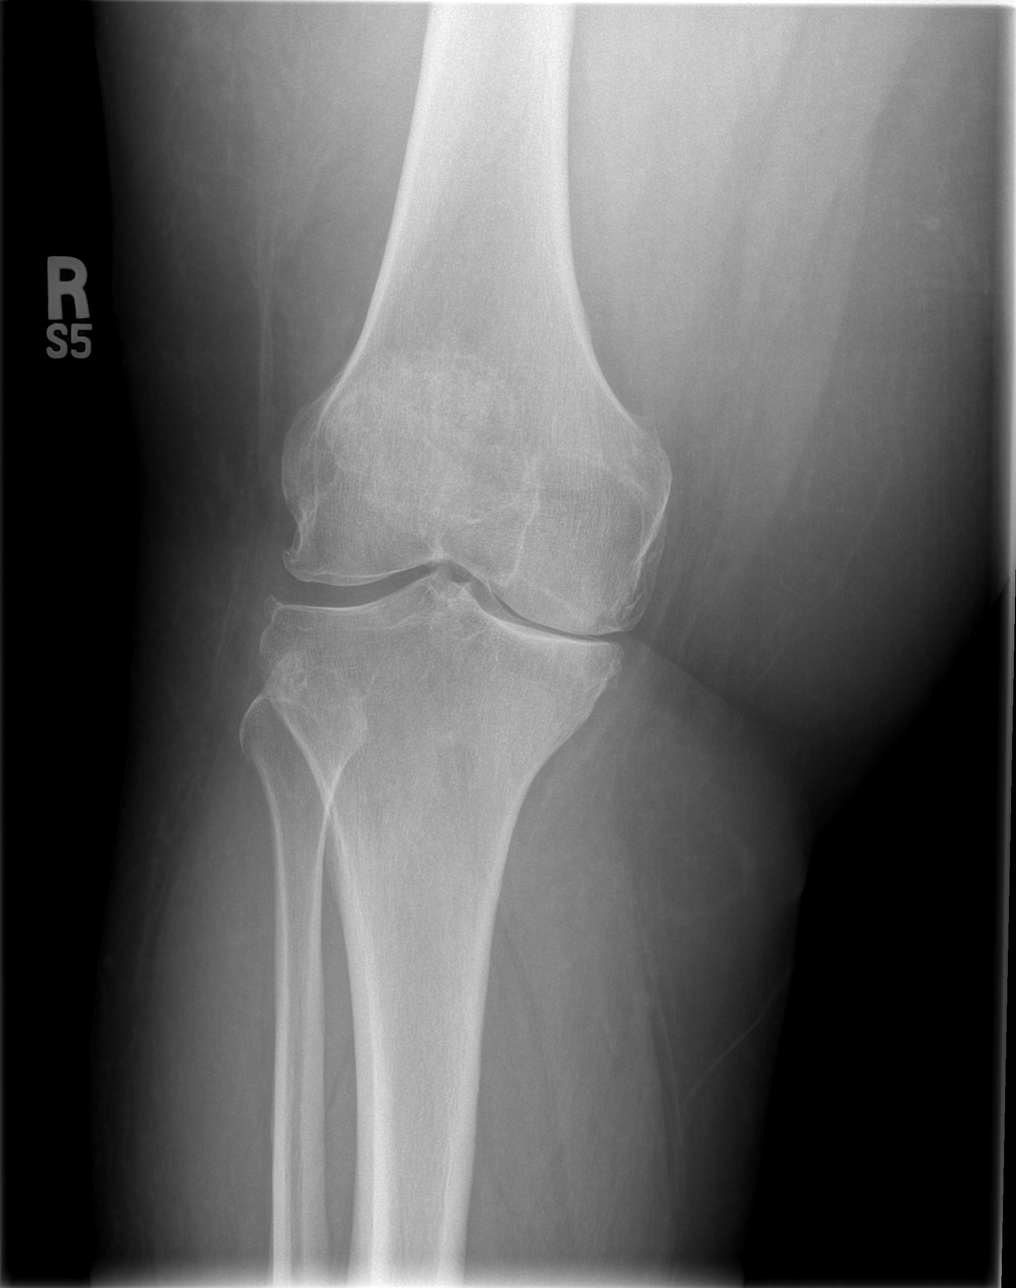

[t knee oblique right (1 of 2)]
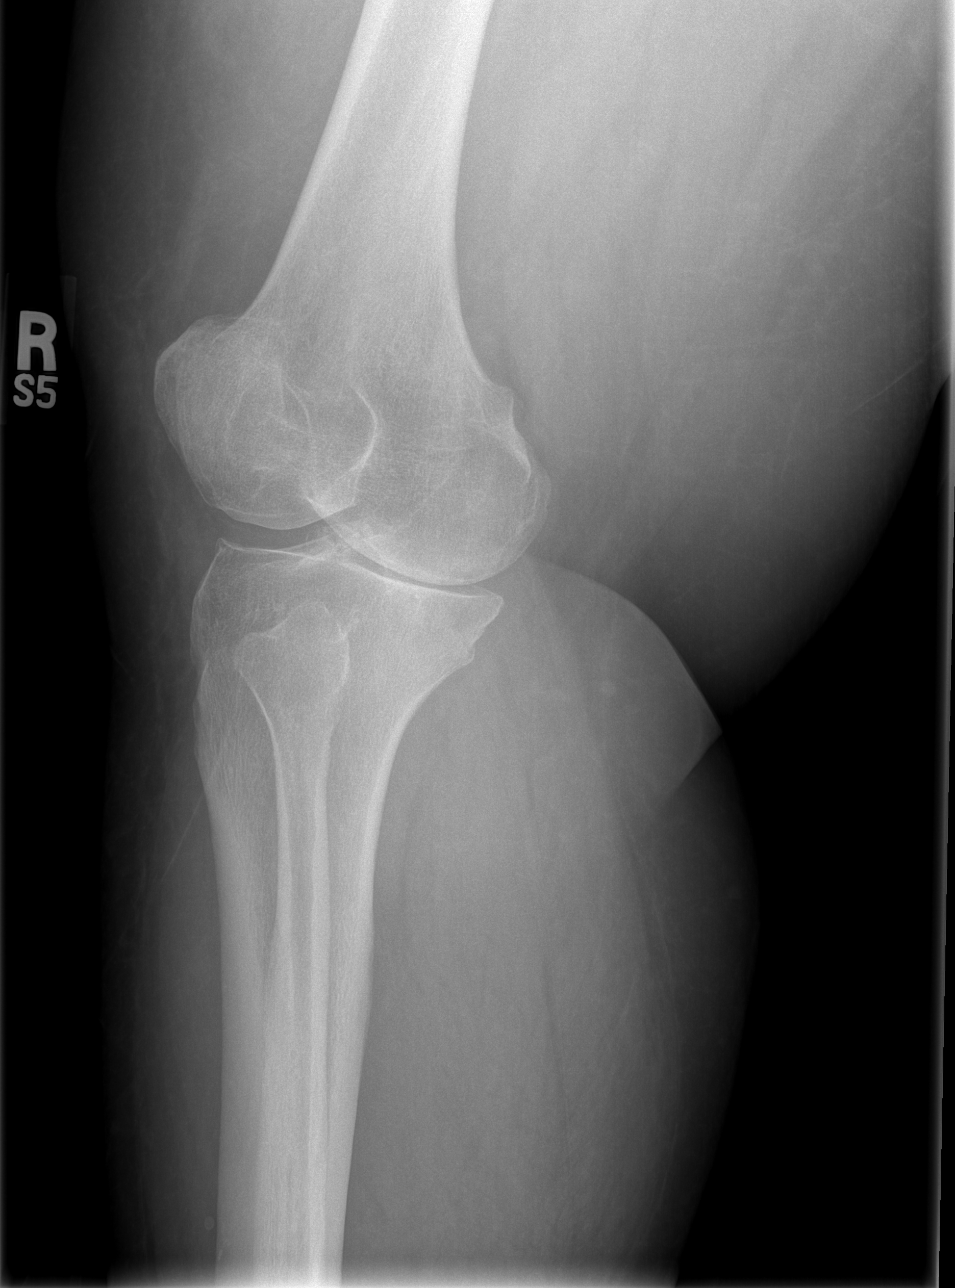

[t knee oblique right (2 of 2)]
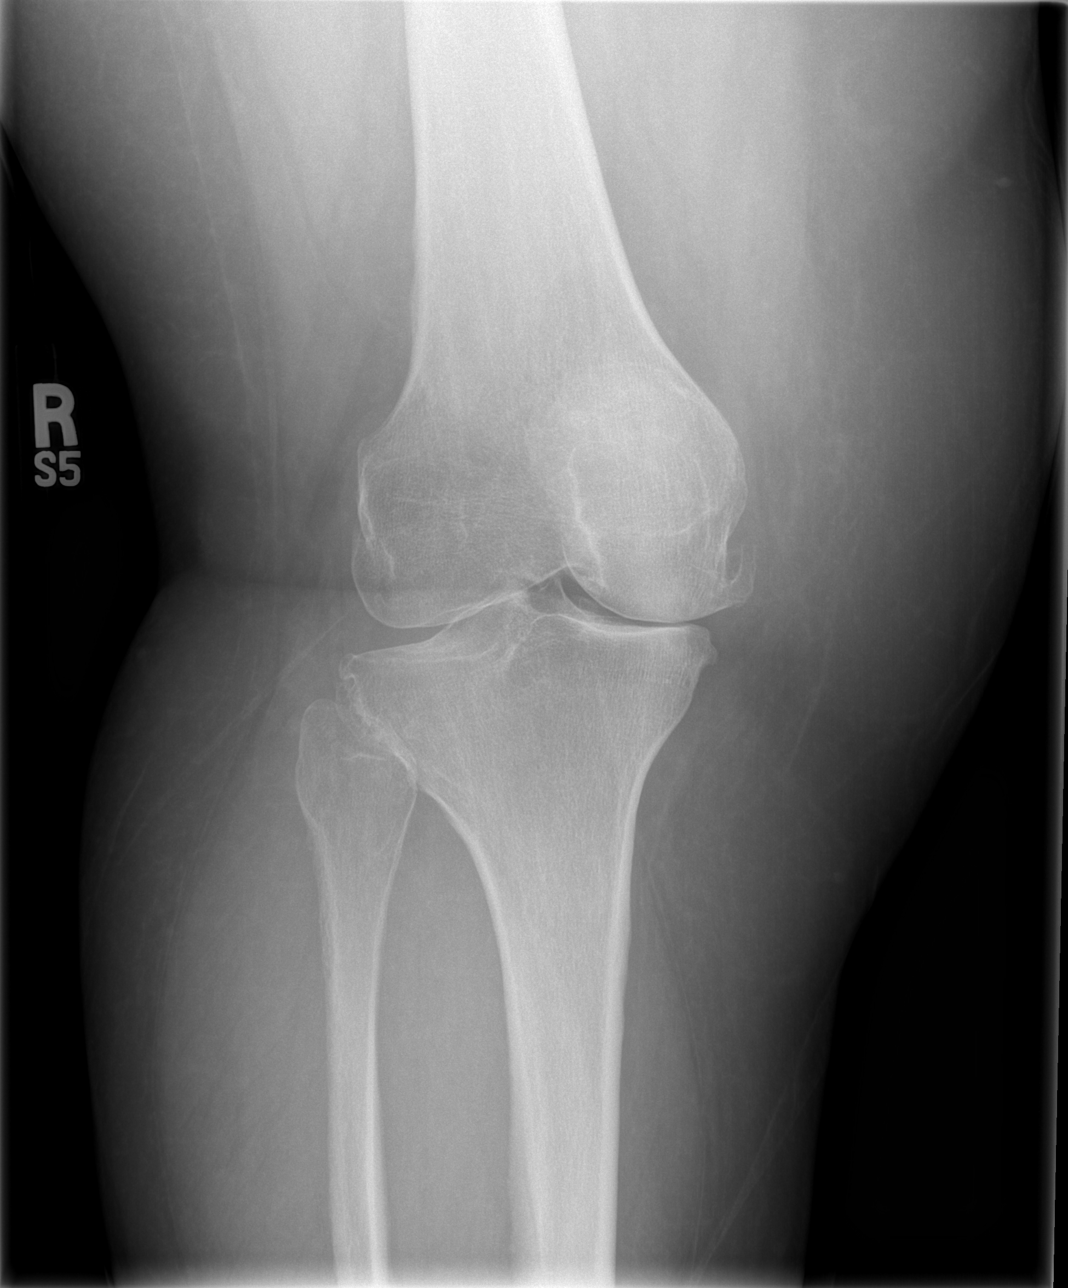

[t knee lat right]
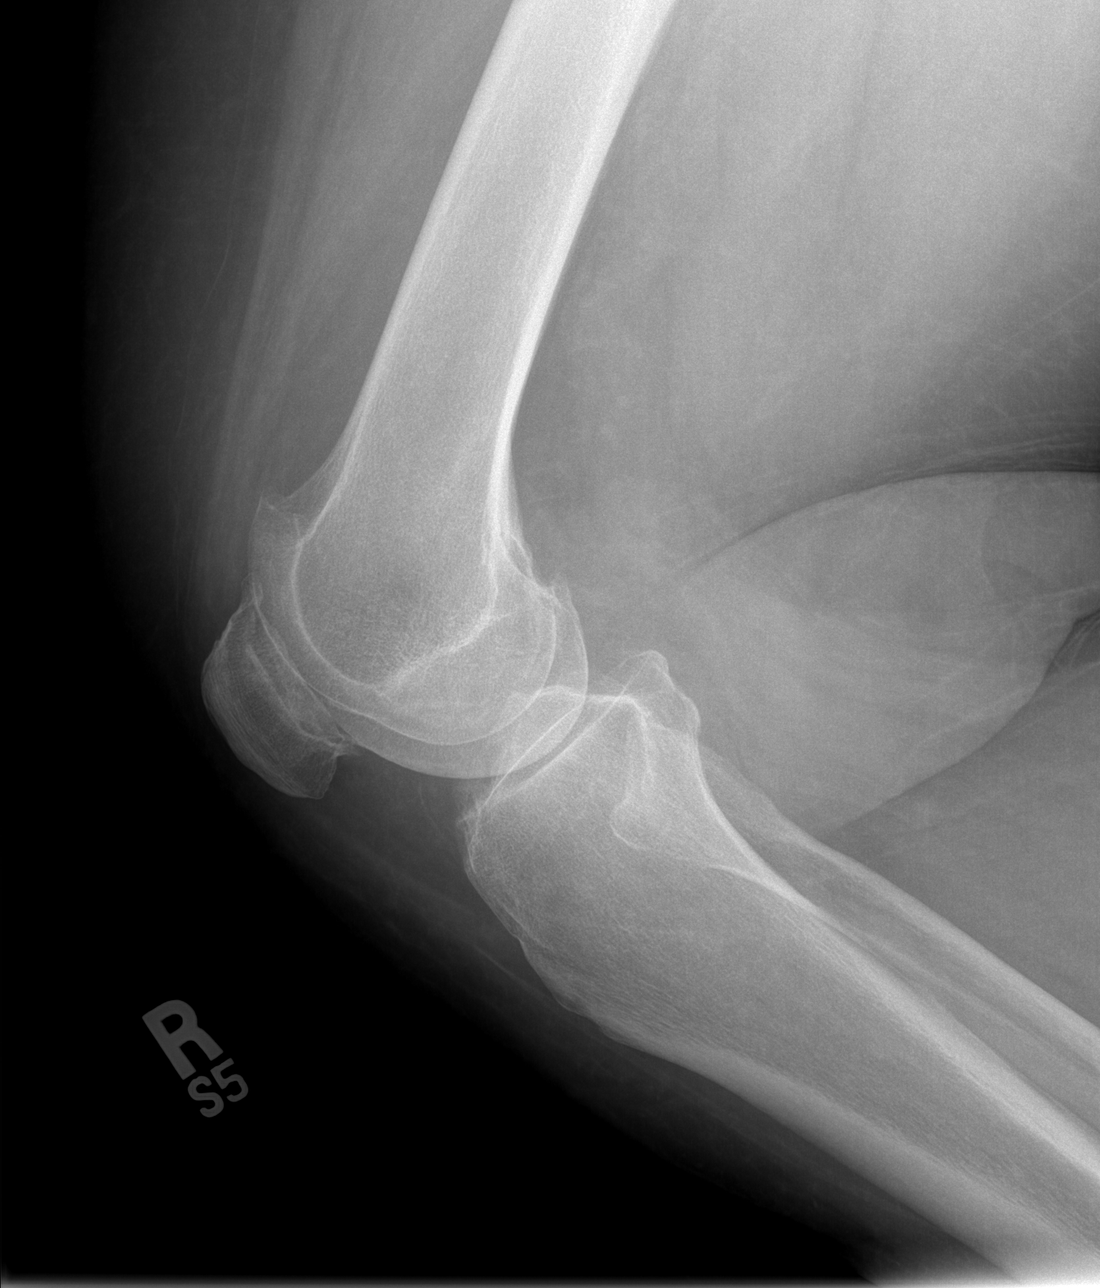

[4 of 4 positions shown; findings below may reference images not displayed]

IMPRESSION: Moderate degenerative changes.  Negative for fracture.

## 2008-12-29 HISTORY — PX: COLONOSCOPY: SHX174

## 2009-02-08 ENCOUNTER — Inpatient Hospital Stay (HOSPITAL_COMMUNITY): Admission: EM | Admit: 2009-02-08 | Discharge: 2009-02-10 | Payer: Self-pay | Admitting: Emergency Medicine

## 2009-02-08 IMAGING — CR DG CHEST 2V
2 series · 2 of 2 positions shown · non-contrast
Comparison: [DATE]

CLINICAL DATA: Chest pain and hypertension.

CHEST - 2 VIEW

[w chest pa *]
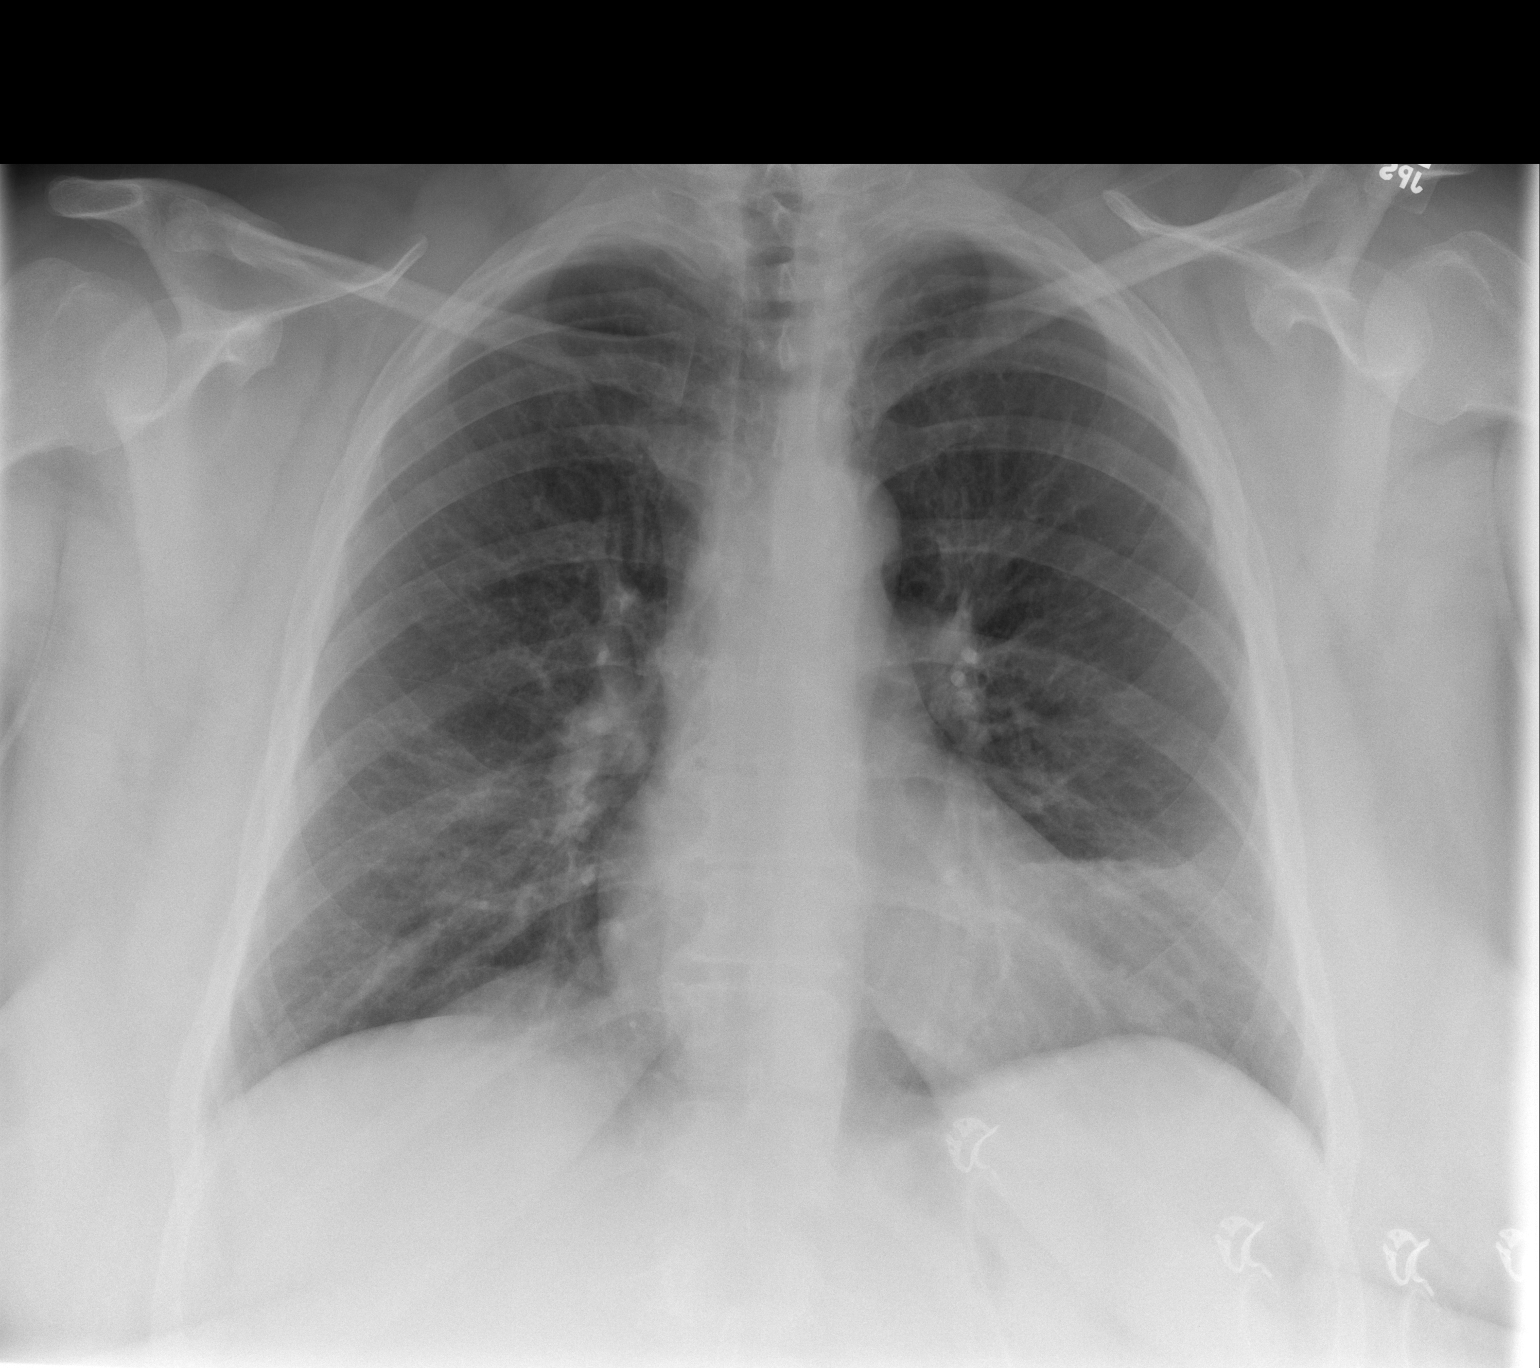

[w chest lat *]
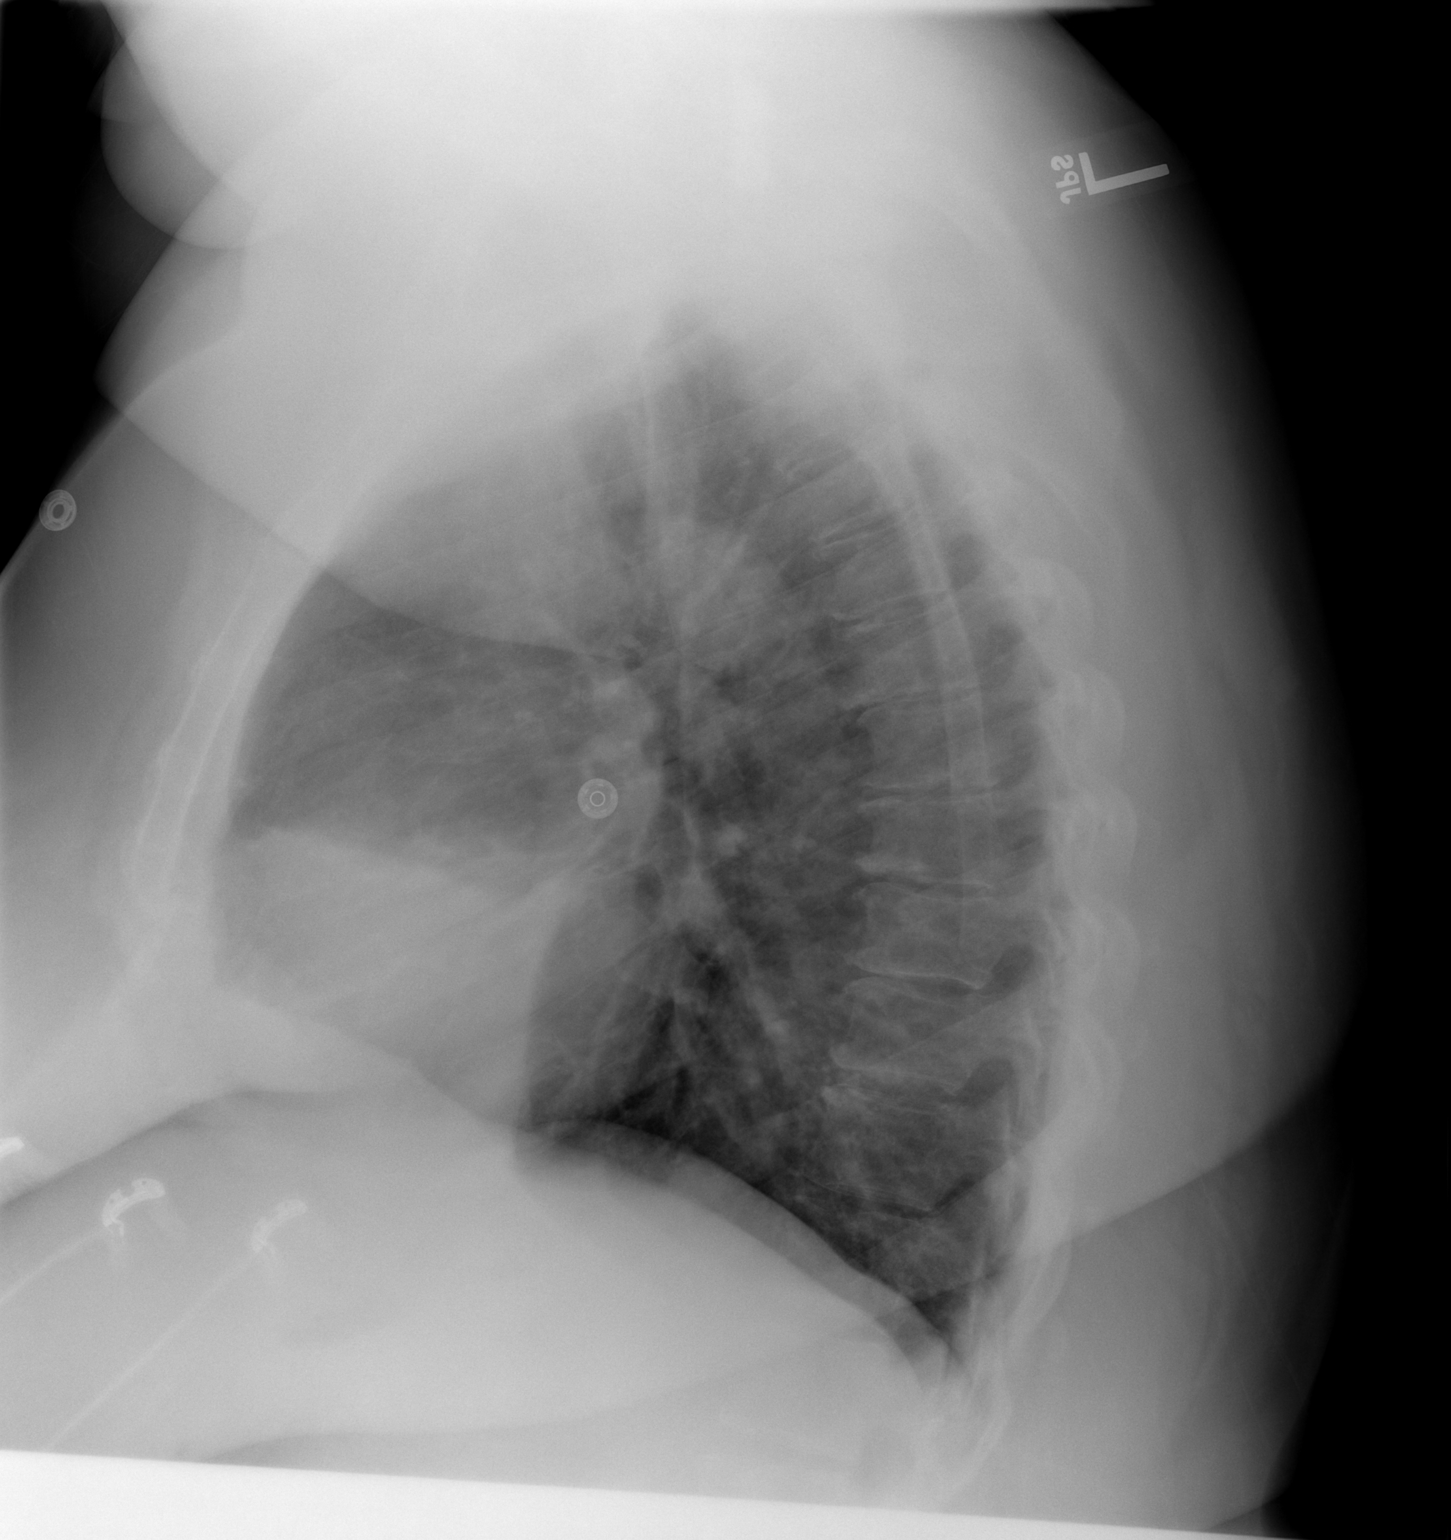

[2 of 2 positions shown; findings below may reference images not displayed]

FINDINGS: The cardiac silhouette, mediastinal and hilar contours
are within normal limits.  There are mild chronic bronchitic type
changes and prominent epicardial fat on the left side.  This is
stable.  No acute pulmonary findings.  The bony thorax is intact.
Stable degenerative changes in the thoracic spine.
IMPRESSION: Mild chronic bronchitic type changes but no acute pulmonary
findings.

## 2009-02-09 IMAGING — CT CT ANGIO CHEST
1 of 10 series · 9 of 37 positions shown · IV contrast (APPLIED)
Comparison: [DATE] CT and [DATE] chest x-ray

CLINICAL DATA: Chest pain.

CT ANGIOGRAPHY CHEST
TECHNIQUE: Multidetector CT imaging of the chest using the
standard protocol during bolus administration of intravenous
contrast. Multiplanar reconstructed images including MIPs were
obtained and reviewed to evaluate the vascular anatomy.
Contrast: 140 ml intravenous Omnipaque 350

[Series 14: pulm embolism 1.0 b25f thins · axial · 0.67mm/px · z∈[-284,-76]mm · 9 of 261 slices shown]
[im 27/261  lung]
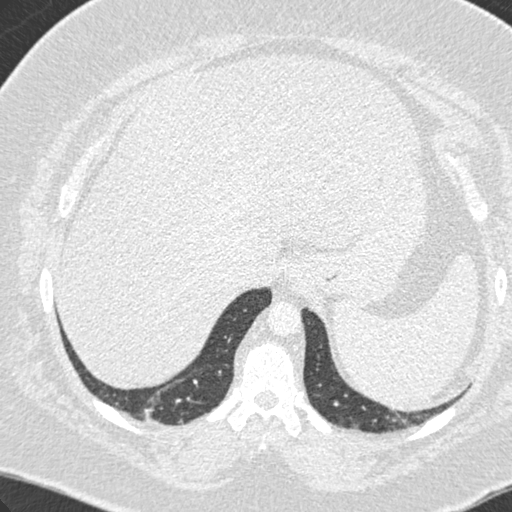
[im 53/261  mediastinal]
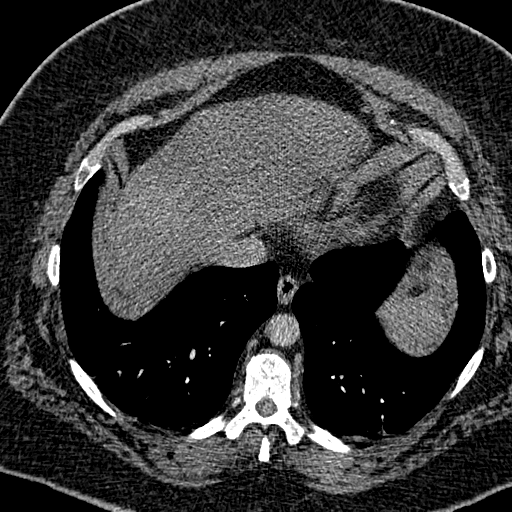
[im 79/261  lung]
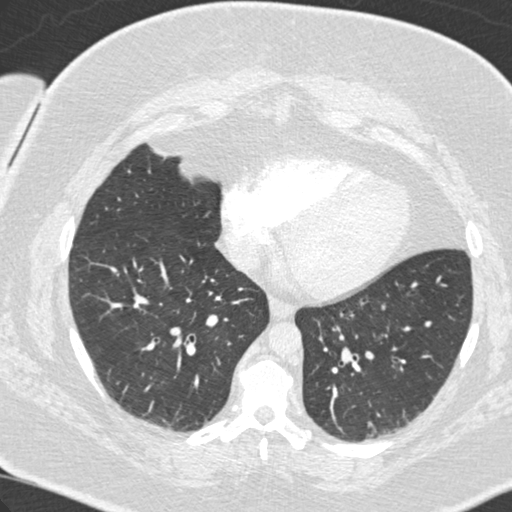
[im 105/261  mediastinal]
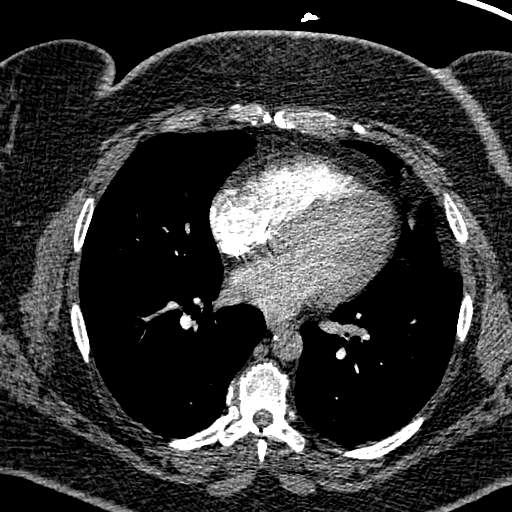
[im 131/261  lung]
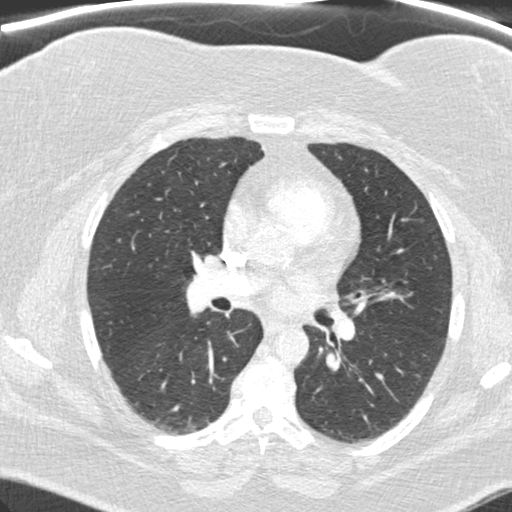
[im 157/261  mediastinal]
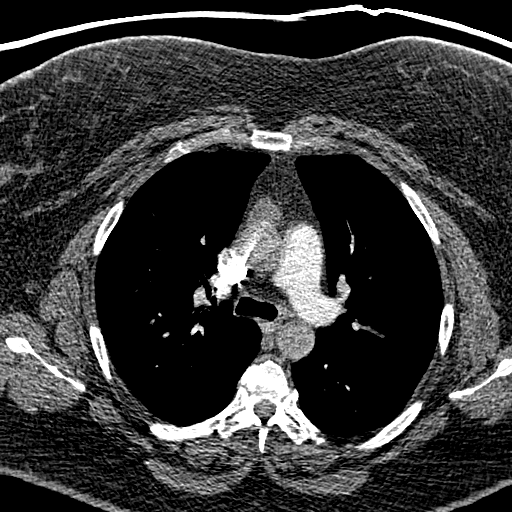
[im 183/261  lung]
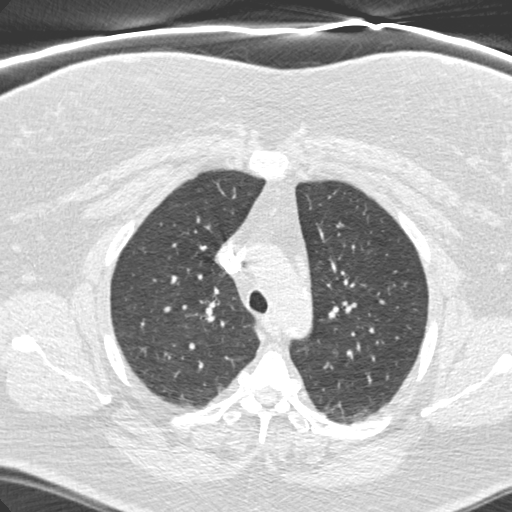
[im 209/261  mediastinal]
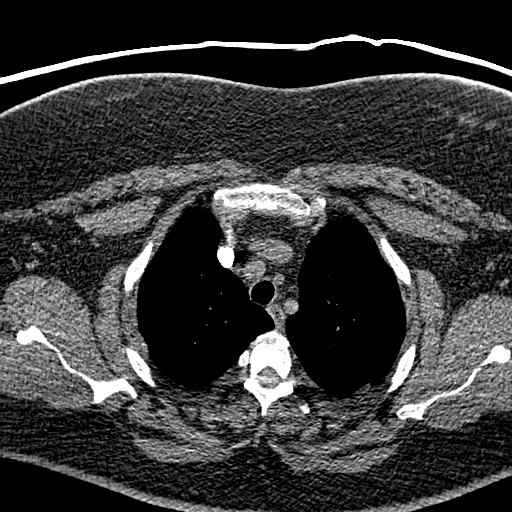
[im 235/261  lung]
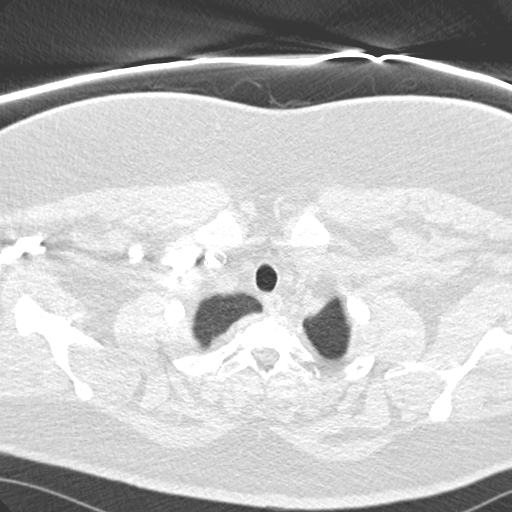

[9 of 37 positions shown; findings below may reference images not displayed]

FINDINGS: This is a technically adequate study.

There are no filling defects in the pulmonary arterial system to
suggest pulmonary emboli.
No evidence of thoracic aortic aneurysm noted.
The heart and great vessels are stable with evidence of aortic
valvular calcifications.

No pleural or pericardial effusions are identified.
No enlarged lymph nodes are present in the axillary, mediastinal,
or hilar regions.

Mild bibasilar scarring is identified.
The lungs are otherwise clear.
No new pulmonary opacities are noted.

No acute or suspicious bony abnormalities are present.
IMPRESSION: No evidence of acute abnormality.

No evidence of pulmonary emboli.

Stable mild bibasilar scarring.

## 2009-03-30 ENCOUNTER — Emergency Department (HOSPITAL_COMMUNITY): Admission: EM | Admit: 2009-03-30 | Discharge: 2009-03-30 | Payer: Self-pay | Admitting: Emergency Medicine

## 2009-07-04 ENCOUNTER — Encounter (HOSPITAL_COMMUNITY): Admission: RE | Admit: 2009-07-04 | Discharge: 2009-09-27 | Payer: Self-pay | Admitting: Cardiology

## 2011-01-03 ENCOUNTER — Emergency Department (HOSPITAL_COMMUNITY)
Admission: EM | Admit: 2011-01-03 | Discharge: 2011-01-03 | Payer: Self-pay | Source: Home / Self Care | Admitting: Emergency Medicine

## 2011-01-03 LAB — CBC
HCT: 33.4 % — ABNORMAL LOW (ref 36.0–46.0)
Hemoglobin: 11.3 g/dL — ABNORMAL LOW (ref 12.0–15.0)
MCH: 29.6 pg (ref 26.0–34.0)
MCHC: 33.8 g/dL (ref 30.0–36.0)
MCV: 87.4 fL (ref 78.0–100.0)
Platelets: 236 10*3/uL (ref 150–400)
RBC: 3.82 MIL/uL — ABNORMAL LOW (ref 3.87–5.11)
RDW: 13.4 % (ref 11.5–15.5)
WBC: 11 10*3/uL — ABNORMAL HIGH (ref 4.0–10.5)

## 2011-01-03 LAB — POCT CARDIAC MARKERS
CKMB, poc: <1 ng/mL — ABNORMAL LOW (ref 1.0–8.0)
Myoglobin, poc: 103 ng/mL (ref 12–200)
Troponin i, poc: <0.05 ng/mL (ref 0.00–0.09)

## 2011-01-03 LAB — DIFFERENTIAL
Basophils Absolute: 0 10*3/uL (ref 0.0–0.1)
Basophils Relative: 0 % (ref 0–1)
Eosinophils Absolute: 0.3 10*3/uL (ref 0.0–0.7)
Eosinophils Relative: 3 % (ref 0–5)
Lymphocytes Relative: 22 % (ref 12–46)
Lymphs Abs: 2.4 10*3/uL (ref 0.7–4.0)
Monocytes Absolute: 0.5 10*3/uL (ref 0.1–1.0)
Monocytes Relative: 5 % (ref 3–12)
Neutro Abs: 7.7 10*3/uL (ref 1.7–7.7)
Neutrophils Relative %: 70 % (ref 43–77)

## 2011-01-03 IMAGING — CR DG CHEST 2V
2 series · 2 of 2 positions shown · non-contrast
Comparison: [DATE]

CLINICAL DATA: Shortness of breath.  Chest pain.  History of
myocardial infarction.  COPD.

CHEST - 2 VIEW

[w chest pa]
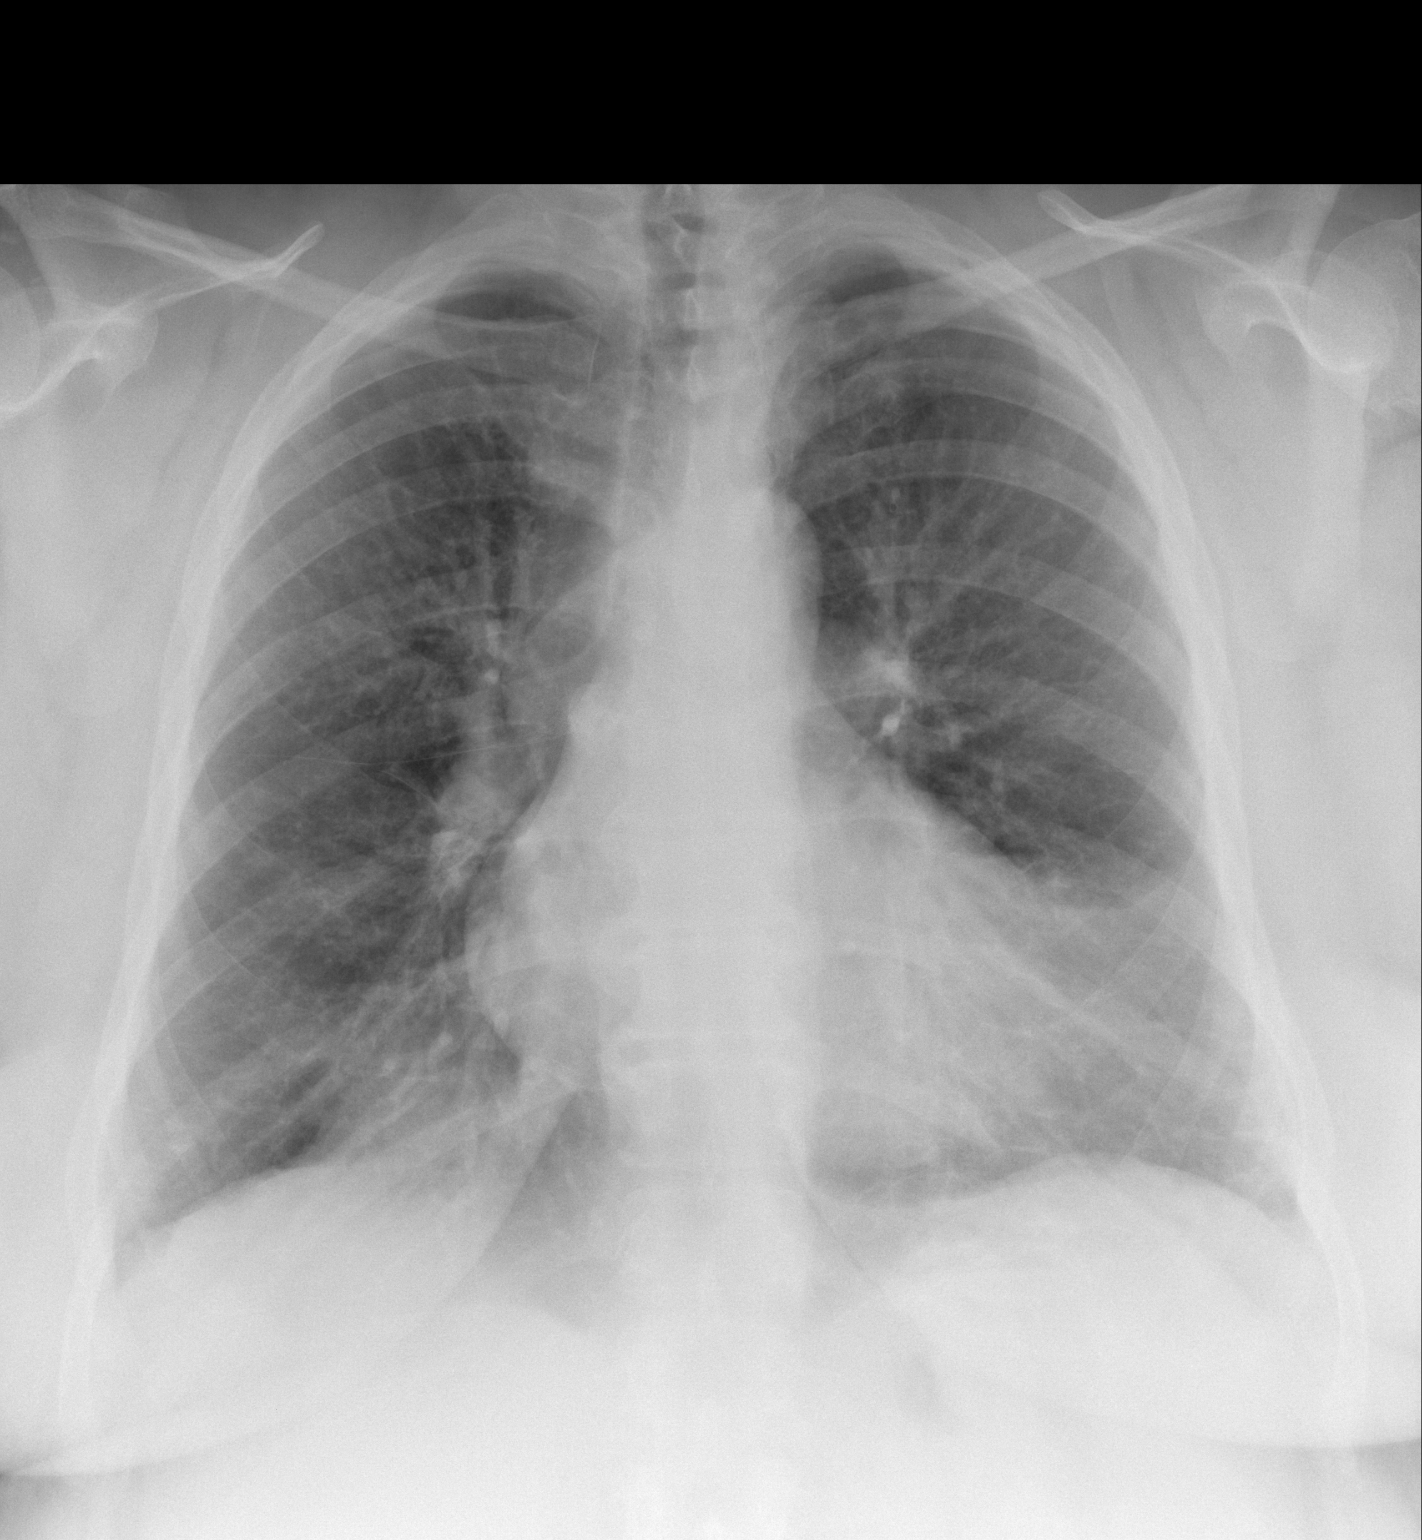

[w chest lat *]
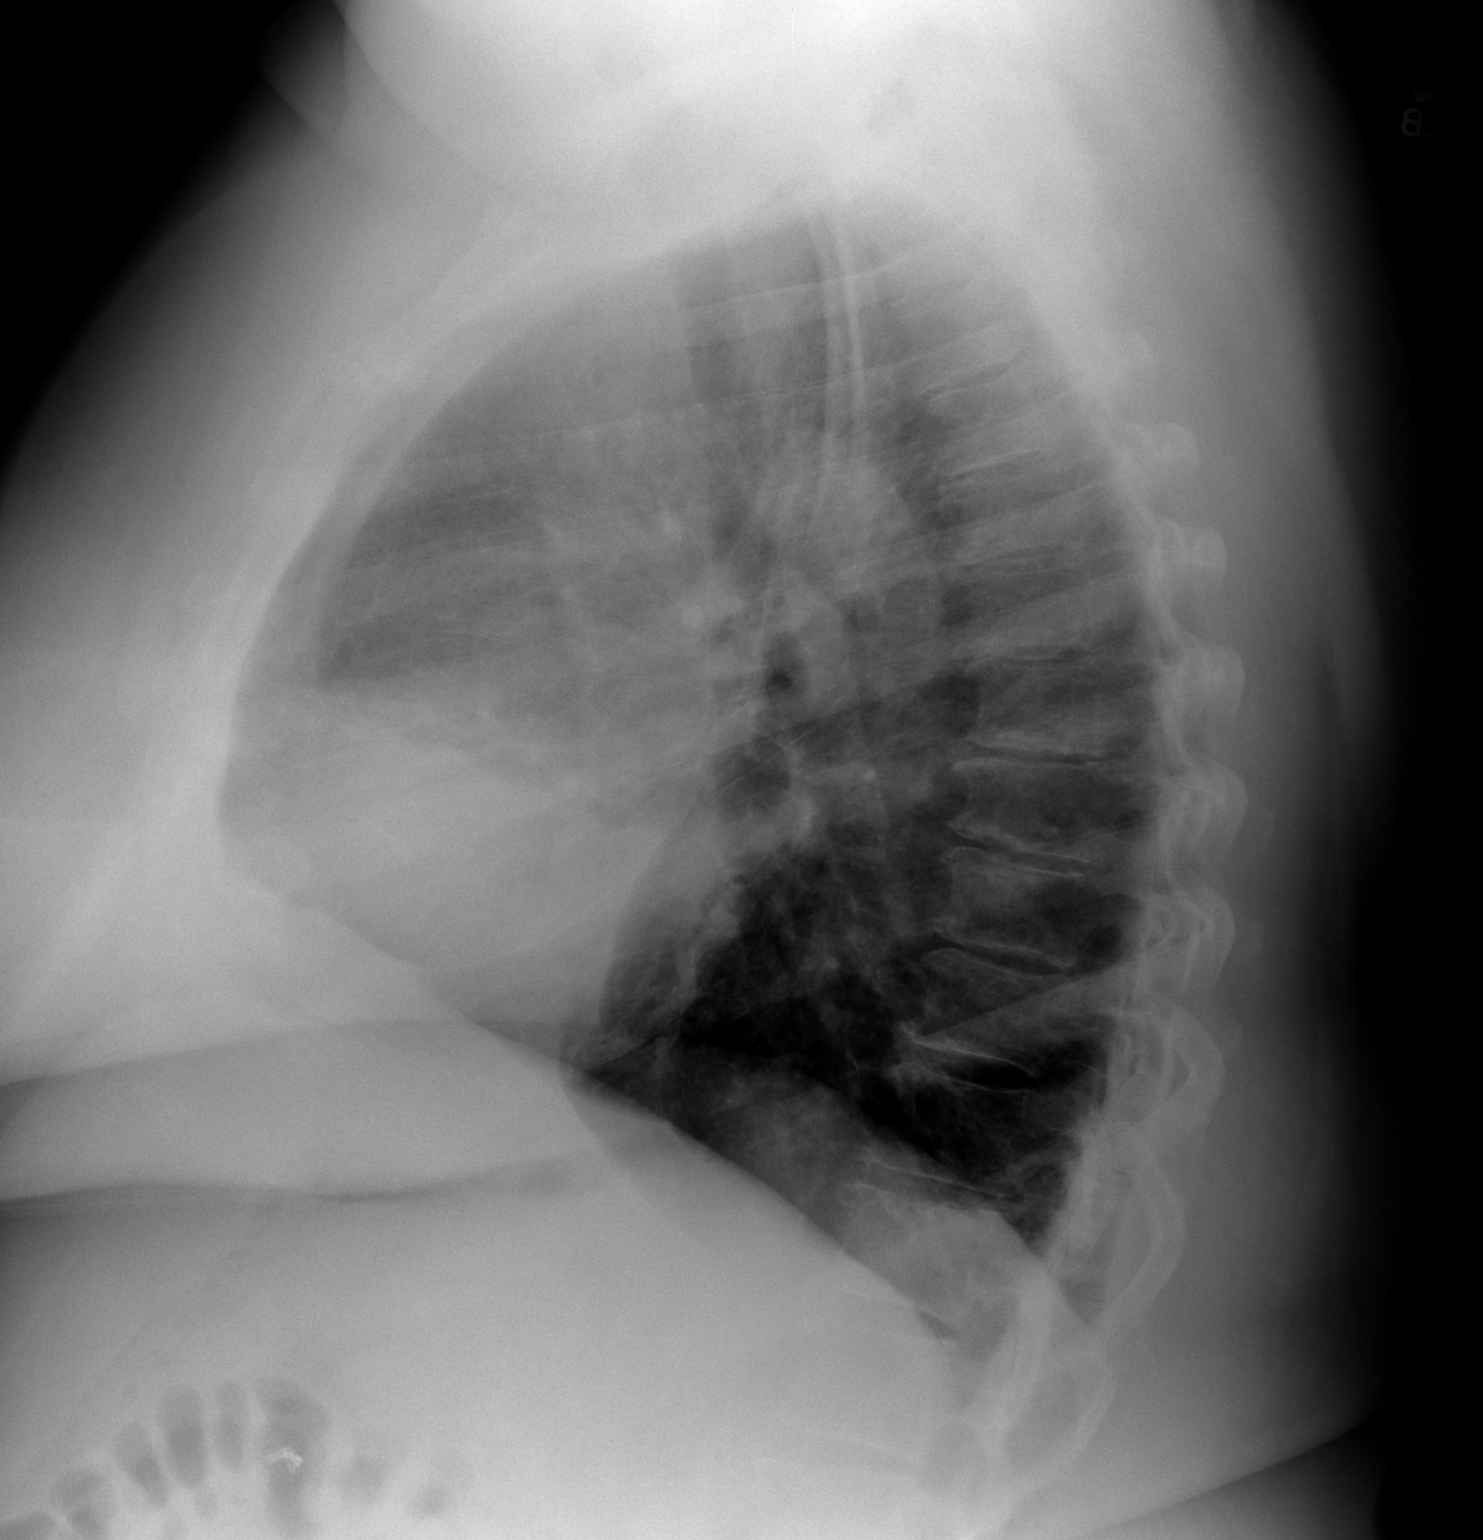

[2 of 2 positions shown; findings below may reference images not displayed]

FINDINGS: There is new pulmonary vascular congestion.  Overall
heart size is normal.  There are no effusions or infiltrates.  No
osseous abnormalities.  There is suggestion of slight Kerley B
lines at the bases.
IMPRESSION: Pulmonary vascular congestion with mild interstitial edema at the
bases.

## 2011-01-04 ENCOUNTER — Inpatient Hospital Stay (HOSPITAL_COMMUNITY)
Admission: EM | Admit: 2011-01-04 | Discharge: 2011-01-06 | Payer: No Typology Code available for payment source | Source: Home / Self Care

## 2011-01-04 IMAGING — CR DG CHEST 2V
2 series · 2 of 2 positions shown · non-contrast
Comparison: [DATE] and [DATE] and a chest CT scan dated
[DATE]

CLINICAL DATA: Shortness of breath.

CHEST - 2 VIEW

[w chest pa]
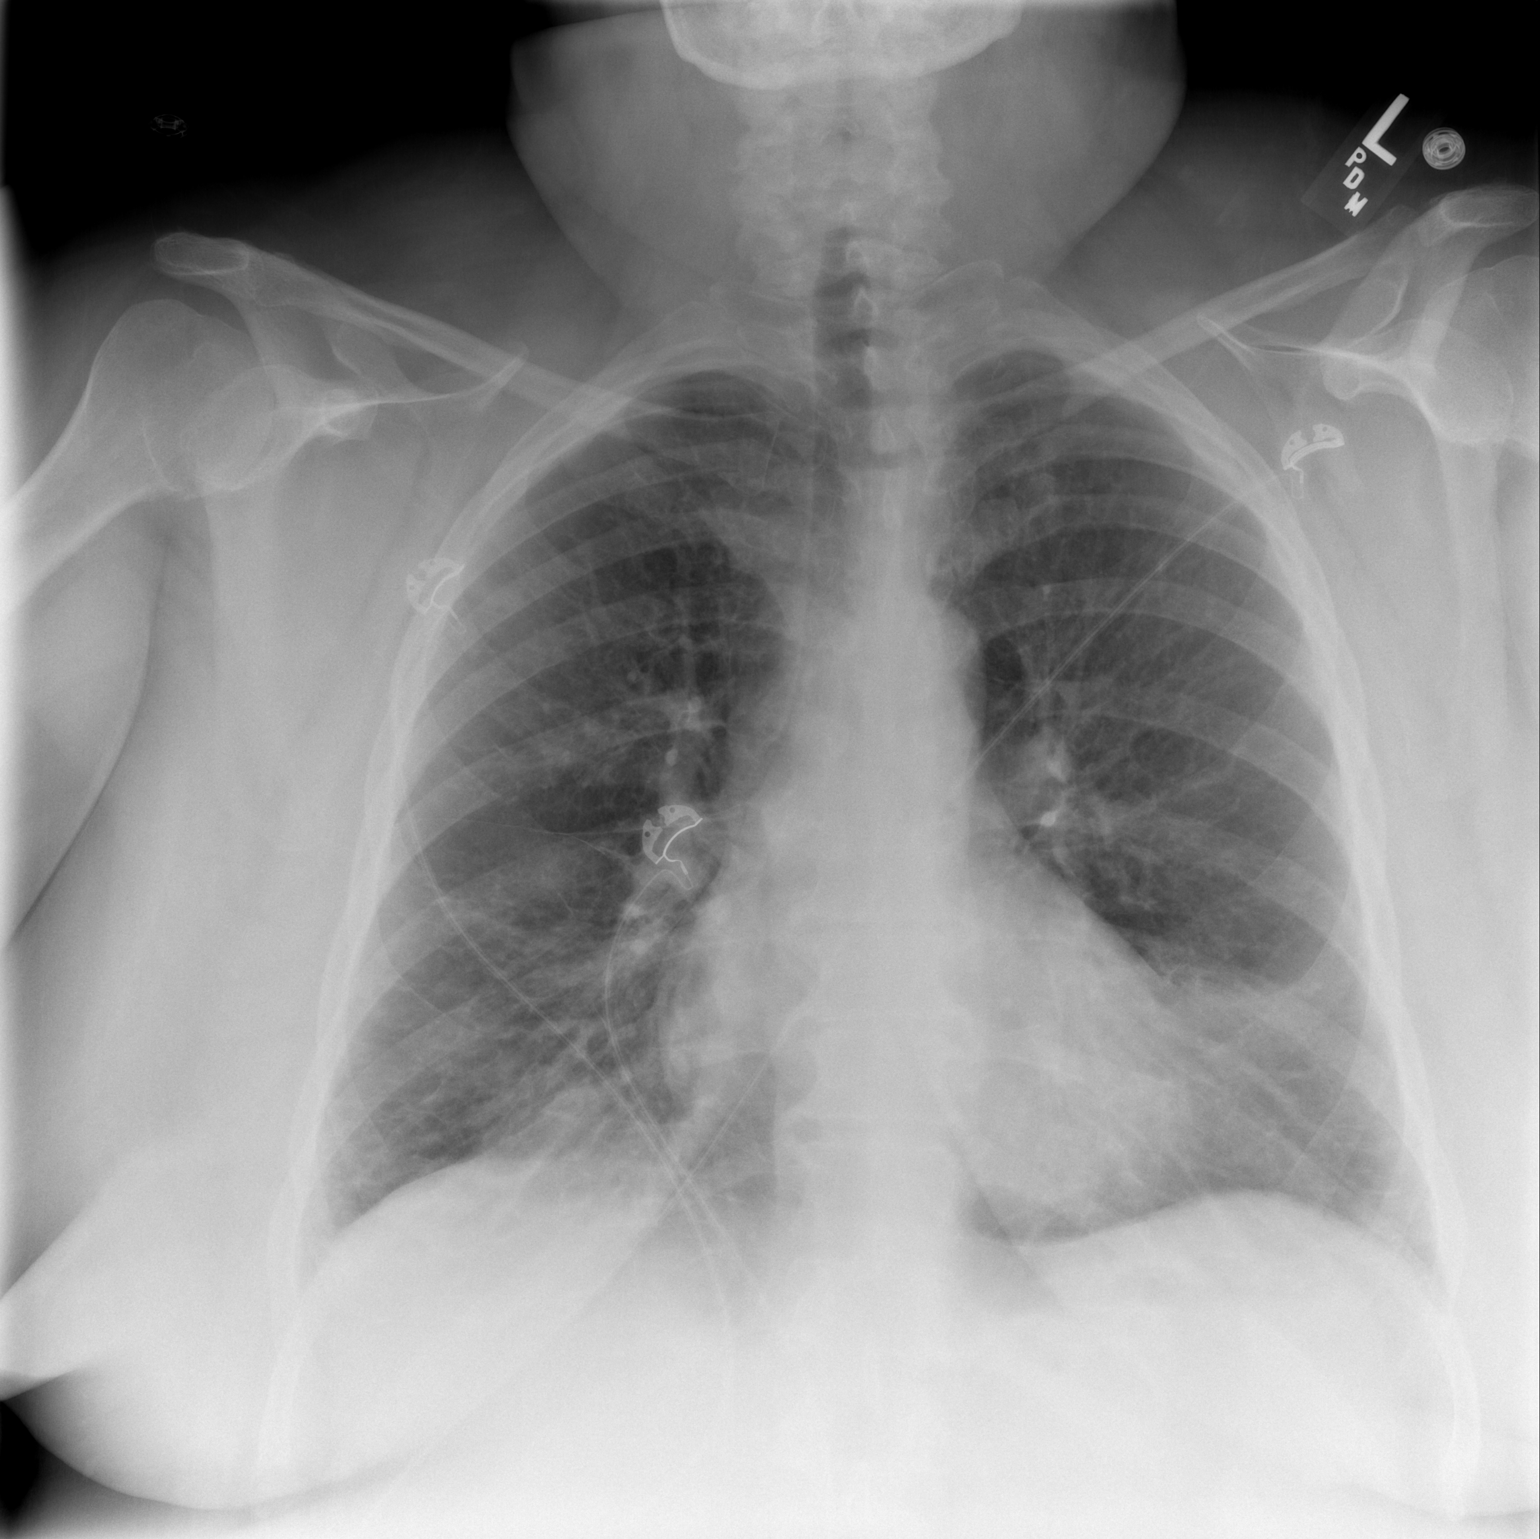

[w chest lat *]
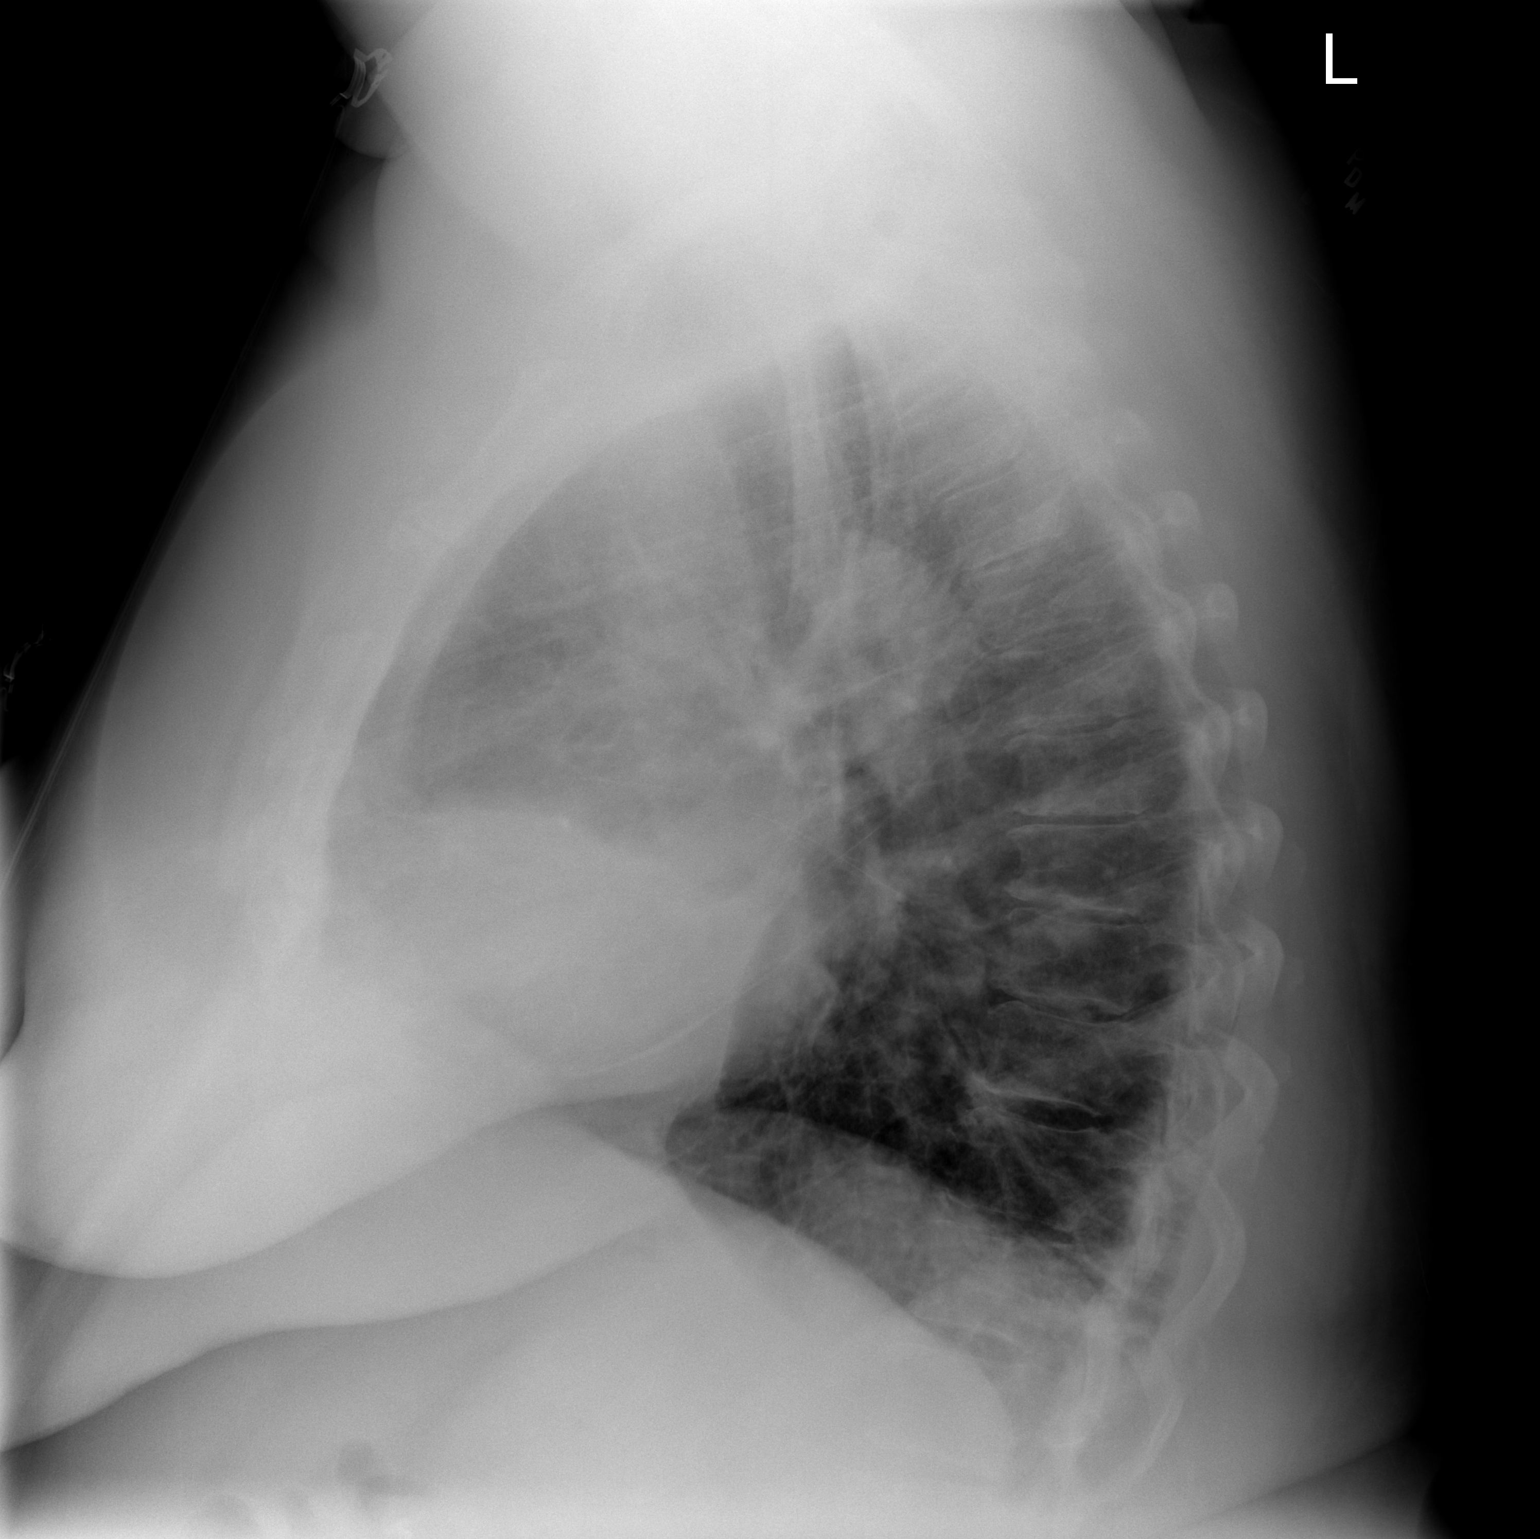

[2 of 2 positions shown; findings below may reference images not displayed]

FINDINGS: The heart size and vascularity are normal.  The patient
has prominent fat pads on the right and left as well as slight
chronic accentuation of the markings at the bases.

There is no acute infiltrate or effusion.  No acute osseous
abnormality.
IMPRESSION: No acute abnormalities.

## 2011-01-13 LAB — CBC
HCT: 30 % — ABNORMAL LOW (ref 36.0–46.0)
HCT: 31.8 % — ABNORMAL LOW (ref 36.0–46.0)
Hemoglobin: 10 g/dL — ABNORMAL LOW (ref 12.0–15.0)
Hemoglobin: 10.7 g/dL — ABNORMAL LOW (ref 12.0–15.0)
MCH: 29.2 pg (ref 26.0–34.0)
MCH: 29.4 pg (ref 26.0–34.0)
MCHC: 33.3 g/dL (ref 30.0–36.0)
MCHC: 33.6 g/dL (ref 30.0–36.0)
MCV: 87.5 fL (ref 78.0–100.0)
MCV: 87.4 fL (ref 78.0–100.0)
Platelets: 214 10*3/uL (ref 150–400)
Platelets: 247 10*3/uL (ref 150–400)
RBC: 3.43 MIL/uL — ABNORMAL LOW (ref 3.87–5.11)
RBC: 3.64 MIL/uL — ABNORMAL LOW (ref 3.87–5.11)
RDW: 13.6 % (ref 11.5–15.5)
RDW: 13.4 % (ref 11.5–15.5)
WBC: 9 10*3/uL (ref 4.0–10.5)
WBC: 8.8 10*3/uL (ref 4.0–10.5)

## 2011-01-13 LAB — DIFFERENTIAL
Basophils Absolute: 0 10*3/uL (ref 0.0–0.1)
Basophils Relative: 0 % (ref 0–1)
Eosinophils Absolute: 0.2 10*3/uL (ref 0.0–0.7)
Eosinophils Relative: 2 % (ref 0–5)
Lymphocytes Relative: 13 % (ref 12–46)
Lymphs Abs: 1.2 10*3/uL (ref 0.7–4.0)
Monocytes Absolute: 0.6 10*3/uL (ref 0.1–1.0)
Monocytes Relative: 6 % (ref 3–12)
Neutro Abs: 7 10*3/uL (ref 1.7–7.7)
Neutrophils Relative %: 78 % — ABNORMAL HIGH (ref 43–77)

## 2011-01-13 LAB — COMPREHENSIVE METABOLIC PANEL
ALT: 25 U/L (ref 0–35)
AST: 16 U/L (ref 0–37)
Albumin: 3.4 g/dL — ABNORMAL LOW (ref 3.5–5.2)
Alkaline Phosphatase: 61 U/L (ref 39–117)
BUN: 12 mg/dL (ref 6–23)
CO2: 27 mEq/L (ref 19–32)
Calcium: 9 mg/dL (ref 8.4–10.5)
Chloride: 104 mEq/L (ref 96–112)
Creatinine, Ser: 0.88 mg/dL (ref 0.4–1.2)
GFR calc Af Amer: >60 mL/min (ref >60)
GFR calc non Af Amer: >60 mL/min (ref >60)
Glucose, Bld: 213 mg/dL — ABNORMAL HIGH (ref 70–99)
Potassium: 3.7 mEq/L (ref 3.5–5.1)
Sodium: 139 mEq/L (ref 135–145)
Total Bilirubin: 0.7 mg/dL (ref 0.3–1.2)
Total Protein: 6.8 g/dL (ref 6.0–8.3)

## 2011-01-13 LAB — GLUCOSE, CAPILLARY
Glucose-Capillary: 110 mg/dL — ABNORMAL HIGH (ref 70–99)
Glucose-Capillary: 122 mg/dL — ABNORMAL HIGH (ref 70–99)
Glucose-Capillary: 169 mg/dL — ABNORMAL HIGH (ref 70–99)
Glucose-Capillary: 186 mg/dL — ABNORMAL HIGH (ref 70–99)
Glucose-Capillary: 187 mg/dL — ABNORMAL HIGH (ref 70–99)
Glucose-Capillary: 145 mg/dL — ABNORMAL HIGH (ref 70–99)
Glucose-Capillary: 134 mg/dL — ABNORMAL HIGH (ref 70–99)
Glucose-Capillary: 116 mg/dL — ABNORMAL HIGH (ref 70–99)
Glucose-Capillary: 96 mg/dL (ref 70–99)

## 2011-01-13 LAB — BASIC METABOLIC PANEL
BUN: 14 mg/dL (ref 6–23)
BUN: 12 mg/dL (ref 6–23)
CO2: 25 mEq/L (ref 19–32)
CO2: 25 mEq/L (ref 19–32)
Calcium: 9.4 mg/dL (ref 8.4–10.5)
Calcium: 8.8 mg/dL (ref 8.4–10.5)
Chloride: 109 mEq/L (ref 96–112)
Chloride: 103 mEq/L (ref 96–112)
Creatinine, Ser: 1.08 mg/dL (ref 0.4–1.2)
Creatinine, Ser: 1.14 mg/dL (ref 0.4–1.2)
GFR calc Af Amer: >60 mL/min (ref >60)
GFR calc Af Amer: >60 mL/min (ref >60)
GFR calc non Af Amer: 53 mL/min — ABNORMAL LOW (ref >60)
GFR calc non Af Amer: 50 mL/min — ABNORMAL LOW (ref >60)
Glucose, Bld: 103 mg/dL — ABNORMAL HIGH (ref 70–99)
Glucose, Bld: 155 mg/dL — ABNORMAL HIGH (ref 70–99)
Potassium: 3.9 mEq/L (ref 3.5–5.1)
Potassium: 3.7 mEq/L (ref 3.5–5.1)
Sodium: 143 mEq/L (ref 135–145)
Sodium: 140 mEq/L (ref 135–145)

## 2011-01-13 LAB — URINALYSIS, ROUTINE W REFLEX MICROSCOPIC
Bilirubin Urine: NEGATIVE
Hgb urine dipstick: NEGATIVE
Ketones, ur: NEGATIVE mg/dL
Nitrite: NEGATIVE
Protein, ur: NEGATIVE mg/dL
Specific Gravity, Urine: 1.01 (ref 1.005–1.030)
Urine Glucose, Fasting: NEGATIVE mg/dL
Urobilinogen, UA: 0.2 mg/dL (ref 0.0–1.0)
pH: 5.5 (ref 5.0–8.0)

## 2011-01-13 LAB — POCT CARDIAC MARKERS
CKMB, poc: <1 ng/mL — ABNORMAL LOW (ref 1.0–8.0)
Myoglobin, poc: 66.7 ng/mL (ref 12–200)
Troponin i, poc: <0.05 ng/mL (ref 0.00–0.09)

## 2011-01-13 LAB — CARDIAC PANEL(CRET KIN+CKTOT+MB+TROPI)
CK, MB: 1.6 ng/mL (ref 0.3–4.0)
CK, MB: 1.7 ng/mL (ref 0.3–4.0)
Relative Index: 1.6 (ref 0.0–2.5)
Relative Index: INVALID (ref 0.0–2.5)
Total CK: 101 U/L (ref 7–177)
Total CK: 93 U/L (ref 7–177)
Troponin I: 0.01 ng/mL (ref 0.00–0.06)
Troponin I: 0.02 ng/mL (ref 0.00–0.06)

## 2011-01-13 LAB — CK TOTAL AND CKMB (NOT AT ARMC)
CK, MB: 1.6 ng/mL (ref 0.3–4.0)
Relative Index: INVALID (ref 0.0–2.5)
Total CK: 96 U/L (ref 7–177)

## 2011-01-13 LAB — URINE MICROSCOPIC-ADD ON

## 2011-01-13 LAB — TSH: TSH: 5.931 u[IU]/mL — ABNORMAL HIGH (ref 0.350–4.500)

## 2011-01-13 LAB — MAGNESIUM: Magnesium: 1.9 mg/dL (ref 1.5–2.5)

## 2011-01-13 LAB — D-DIMER, QUANTITATIVE: D-Dimer, Quant: 0.26 ug/mL-FEU (ref 0.00–0.48)

## 2011-01-13 LAB — BRAIN NATRIURETIC PEPTIDE
Pro B Natriuretic peptide (BNP): <30 pg/mL (ref 0.0–100.0)
Pro B Natriuretic peptide (BNP): 37 pg/mL (ref 0.0–100.0)

## 2011-01-13 LAB — HEMOGLOBIN A1C
Hgb A1c MFr Bld: 6.2 % — ABNORMAL HIGH (ref <5.7)
Mean Plasma Glucose: 131 mg/dL — ABNORMAL HIGH (ref <117)

## 2011-04-06 LAB — GLUCOSE, CAPILLARY: Glucose-Capillary: 144 mg/dL — ABNORMAL HIGH (ref 70–99)

## 2011-04-15 LAB — COMPREHENSIVE METABOLIC PANEL
ALT: 26 U/L (ref 0–35)
AST: 20 U/L (ref 0–37)
Albumin: 3.4 g/dL — ABNORMAL LOW (ref 3.5–5.2)
Alkaline Phosphatase: 71 U/L (ref 39–117)
BUN: 11 mg/dL (ref 6–23)
CO2: 28 mEq/L (ref 19–32)
Calcium: 8.7 mg/dL (ref 8.4–10.5)
Chloride: 98 mEq/L (ref 96–112)
Creatinine, Ser: 0.85 mg/dL (ref 0.4–1.2)
GFR calc Af Amer: >60 mL/min (ref >60)
GFR calc non Af Amer: >60 mL/min (ref >60)
Glucose, Bld: 167 mg/dL — ABNORMAL HIGH (ref 70–99)
Potassium: 3.8 mEq/L (ref 3.5–5.1)
Sodium: 136 mEq/L (ref 135–145)
Total Bilirubin: 0.7 mg/dL (ref 0.3–1.2)
Total Protein: 7.1 g/dL (ref 6.0–8.3)

## 2011-04-15 LAB — HEMOGLOBIN A1C: Hgb A1c MFr Bld: 7 % — ABNORMAL HIGH (ref 4.6–6.1)

## 2011-04-15 LAB — CBC
HCT: 34.5 % — ABNORMAL LOW (ref 36.0–46.0)
HCT: 38.9 % (ref 36.0–46.0)
Hemoglobin: 13.5 g/dL (ref 12.0–15.0)
MCHC: 34.7 g/dL (ref 30.0–36.0)
MCV: 85.7 fL (ref 78.0–100.0)
MCV: 86.6 fL (ref 78.0–100.0)
Platelets: 213 10*3/uL (ref 150–400)
Platelets: 248 10*3/uL (ref 150–400)
RBC: 4.02 MIL/uL (ref 3.87–5.11)
RBC: 4.49 MIL/uL (ref 3.87–5.11)
RDW: 13.8 % (ref 11.5–15.5)
WBC: 11.3 10*3/uL — ABNORMAL HIGH (ref 4.0–10.5)
WBC: 8.1 10*3/uL (ref 4.0–10.5)

## 2011-04-15 LAB — DIFFERENTIAL
Basophils Absolute: 0 10*3/uL (ref 0.0–0.1)
Basophils Relative: 0 % (ref 0–1)
Eosinophils Absolute: 0.3 10*3/uL (ref 0.0–0.7)
Eosinophils Relative: 3 % (ref 0–5)
Lymphocytes Relative: 28 % (ref 12–46)
Lymphs Abs: 3.2 10*3/uL (ref 0.7–4.0)
Monocytes Absolute: 0.7 10*3/uL (ref 0.1–1.0)
Monocytes Relative: 6 % (ref 3–12)
Neutro Abs: 7.1 10*3/uL (ref 1.7–7.7)
Neutrophils Relative %: 63 % (ref 43–77)

## 2011-04-15 LAB — BASIC METABOLIC PANEL
BUN: 11 mg/dL (ref 6–23)
CO2: 29 mEq/L (ref 19–32)
Calcium: 8.7 mg/dL (ref 8.4–10.5)
Chloride: 102 mEq/L (ref 96–112)
Creatinine, Ser: 0.85 mg/dL (ref 0.4–1.2)
GFR calc Af Amer: >60 mL/min (ref >60)
GFR calc non Af Amer: >60 mL/min (ref >60)
Glucose, Bld: 137 mg/dL — ABNORMAL HIGH (ref 70–99)
Potassium: 3.9 mEq/L (ref 3.5–5.1)
Potassium: 4.2 mEq/L (ref 3.5–5.1)
Sodium: 136 mEq/L (ref 135–145)

## 2011-04-15 LAB — POCT CARDIAC MARKERS
CKMB, poc: 1.2 ng/mL (ref 1.0–8.0)
Myoglobin, poc: 85.2 ng/mL (ref 12–200)
Troponin i, poc: <0.05 ng/mL (ref 0.00–0.09)

## 2011-04-15 LAB — CARDIAC PANEL(CRET KIN+CKTOT+MB+TROPI)
Relative Index: 6.4 — ABNORMAL HIGH (ref 0.0–2.5)
Total CK: 133 U/L (ref 7–177)
Troponin I: 1.04 ng/mL (ref 0.00–0.06)

## 2011-04-15 LAB — GLUCOSE, CAPILLARY

## 2011-05-13 NOTE — Discharge Summary (Signed)
[name], [name] NO.:  [id number]   MEDICAL RECORD NO.:  [id number]          PATIENT TYPE:  INP   LOCATION:  2508                         FACILITY:  [facility]   PHYSICIAN:  [name], [name] PrattDATE OF BIRTH:  09-12-1961   DATE OF ADMISSION:  02/08/2009  DATE OF DISCHARGE:  02/10/2009                               DISCHARGE SUMMARY   PRIMARY CARE PHYSICIAN:  Dr. Della Goo.   CARDIOLOGIST:  Dr. Lavonne Chick.   DISCHARGE DIAGNOSES:  1. Single-vessel coronary artery disease.      a.     95% stenosis right distal coronary artery per cardiac       catheterization.      b.     Status post percutaneous transluminal coronary angioplasty       with stent placement - 23 mm Voyager.      c.     Discharged on aspirin and Plavix therapy.  2. Hyperlipidemia - on medical treatment.  3. Newly diagnosed diabetes mellitus - education and treatment      initiated.  4. Morbid obesity.  5. Probable sleep apnea - outpatient sleep study and CPAP initiation      recommended.  6. Hypertension - ACE inhibitor and diuretic to be continued.  7. Gastroesophageal reflux disease - recommending over-the-counter      Prilosec.  8. Prior history of tobacco abuse - discontinued approximately 10      years ago.   DISCHARGE MEDICATIONS:  1. Plavix 75 mg p.o. daily.  2. Glucotrol 5 mg p.o. daily.  3. Benicar/hydrochlorothiazide 40/25 p.o. daily.  4. Celexa 40 mg daily.  5. Lipitor 40 mg p.o. every night.  6. Advair 250/50 one puff b.i.d.  7. Combivent inhaler 2 puffs q.i.d. p.r.n.  8. Lasix 20 mg p.o. daily.  9. Potassium chloride 20 mEq p.o. daily.  10.Aspirin 325 mg p.o. daily.  11.Prilosec OTC 20 mg p.o. b.i.d.   FOLLOW UP:  1. The patient is to follow up with Dr. Lavonne Chick on February 26, 2009,      at 2:00 p.m.  2. The patient is to follow Dr. Della Goo in 5-7 days for      reevaluation.   PROCEDURES:  1. Cardiac catheterization February 08, 2009 - Dr. Lavonne Chick -      status post PTCA with stent placement - Plavix therapy initiated.  2. CT angio of chest February 09, 2009 - no evidence of pulmonary      embolism.   CONSULTATIONS:  [facility].   HOSPITAL COURSE:  [name] is a 50 year old obese female  with multiple cardiac risk factors who was admitted to hospital on  February 08, 2009, with complaints of substernal chest pressure.  Due to  her high risk factors, cardiac catheterization was felt to be the most  appropriate risk stratification.  The patient did rule out for acute MI  with serial enzymes and EKG.  The patient was taken to the  catheterization lab on February 08, 2009, by Dr. Lavonne Chick.  She was  found to have a 95% eccentric distal RCA stenosis.  PTCA was carried out  with a stent being deployed and the stenosis being reduced to 0%.  In  the postoperative period, the patient was treated with Plavix and  aspirin therapy.  Further evaluation during the hospital stay revealed a  hemoglobin A1c of 7.0 with a fasting serum CBG of 144.  As a result, a  formal diagnosis of diabetes mellitus was made.  Nutrition consultation  was carried out prior to the patient's discharge to educate the patient  on diabetic diet.  Glucotrol was initiated.  The patient was already on  ACE inhibitor, and aspirin therapy is being initiated at discharge as  well.  The patient is advised that she will need very strict follow-up  for her diabetes.  She is also advised that significant weight loss  could likely contribute to better control of her diabetes and may even  lead to resolution.  The patient is referred for outpatient diabetic  education, and it is recommended that she be set up with a CBG meter in  the outpatient setting as per Dr. Della Goo.  Due to an elevated  D-dimer and the patient's significant obesity, there was also concern  for the possibility of pulmonary embolism.  As a result, a CT scan  of  the chest was carried out.  This revealed no evidence of pulmonary  embolism.   On February 10, 2009, the patient was deemed to be stable for discharge  home.  Vital signs were stable and the patient was afebrile.  Her [name]  pressure was well-controlled.  The patient was therefore cleared for  discharge on the above-listed medical regimen with absolute compliance  with the medications encouraged.      [name], M.D.  Electronically Signed     JTM/MEDQ  D:  02/10/2009  T:  02/10/2009  Job:  [id number]   cc:   Della Goo, M.D.  Madaline Savage, M.D.

## 2011-05-13 NOTE — Cardiovascular Report (Signed)
[name], [name] NO.:  [id number]   MEDICAL RECORD NO.:  [id number]          PATIENT TYPE:  INP   LOCATION:  2508                         FACILITY:  [facility]   PHYSICIAN:  Madaline Savage, M.D.DATE OF BIRTH:  March 07, 1961   DATE OF PROCEDURE:  02/08/2009  DATE OF DISCHARGE:  02/10/2009                            CARDIAC CATHETERIZATION   PROCEDURES:  Cardiac catheterization with following procedures performed  1. Selective coronary angiography by Judkins technique.  2. Retrograde left heart catheterization.  3. Left ventricular angiography.  4. Intracoronary artery stenting with an Xience 2.75 x 23 mm drug-      eluting stent.  5. Right percutaneous femoral artery Angio-Seal closure.   PATIENT PROFILE:  [name] is a very pleasant 50 year old female who  presented to [facility] Emergency Room this morning with complaints of  chest pain.  Her EKG in the emergency room at 0458 hours showed a normal  EKG with no evidence of ischemia.  Her chest pain was described as an  upper sternal discomfort that sounded very anginal to me and we made  arrangements for her to come to the cardiac cath lab.  Upon arrival, the  patient was placed on the cath lab table and cardiac catheterization was  performed.   PAST MEDICAL HISTORY:  The patient has:  1. Obstructive sleep apnea.  2. Hypertension.  3. Hyperlipidemia.  4. Esophageal reflux disorder.  5. Chronic obstructive lung disease.  6. Asthma.  7. Depression.  She is roughly 5 feet 4 inches tall and weighs 245 pounds.  Her troponin  in the emergency room was less than 0.05.  Her chest x-ray showed mild  chronic bronchitis.   SOCIAL HISTORY:  Is that of a married woman who is a former smoker.  She  smoked 1 pack a day for 7 years, but quit 8-9 years ago.   RESULTS:  The left ventricular pressure recorded showed no aortic valve  gradient by pullback technique.   ANGIOGRAPHIC RESULTS:  The patient's left main  coronary artery is short  and normal.  The left anterior descending coronary artery coursed to the  cardiac apex and gave rise to 2 diagonal branches.  There is a lot of  tortuosity in the left anterior descending coronary artery, but it is a  normal vessel.  There are approximately 5-6 septal perforator branches  that are also normal.  No disease is seen in the left anterior  descending coronary artery.  The left circumflex coronary artery is  nondominant.  It gives rise to a posterolateral branch distally and 2  small obtuse marginal branches in the midportion of the vessel.  No  lesions were seen.   The right coronary artery is a dominant vessel of the circulation which  gives rise to a huge pulmonary conus branch, a normal size posterior  descending branch, and a posterolateral branch and there is a 95%  eccentric stenosis in the distal RCA about 15 mm from the bifurcation of  the PLA to the PDA.  It is eccentric.  It is hypodense.   Left ventricular angiogram showed vigorous contractility of  all wall  segments.  I estimate her ejection fraction at 65-70%.  No mitral  regurgitation is seen.  No LV thrombus is noted.   Percutaneous coronary intervention was accomplished by the right  percutaneous femoral artery approach using a 6-French left Judkins guide  catheter and a Prowater wire and the vessel was directly stented at the  site of a 95% stenosis in the distal RCA with a 2.75 x 23-mm Xience  stent with inflation pressures of up to 14 atmospheres of pressure.  We  then used a noncompliant Voyager balloon to post dilate the stented  vessel.  The inflation pressure of the Voyager noncompliant balloon was  16 atmospheres.   The patient tolerated the procedure well.  The stenotic vessel with 95%  was reduced to 0% residual, and there was preservation of TIMI III flow  in the distal right coronary artery.   FINAL IMPRESSIONS:  1. Chest pain onset this morning causing the patient  to present to our      [facility] Emergency Room.  2. Multiple cardiac risk factors.  3. Single-vessel coronary arterial disease of the right coronary      artery of 95%, successful percutaneous direct coronary artery      stenting of the distal right coronary artery with reduction of the      95% stenosis to 0% residual.  4. Successful Angio-Seal closure was applied to the right femoral      artery successfully.   PLAN:  The patient will be transferred to unit 2500 and it should be  noted that the patient received Angiomax as well as Plavix and aspirin  and the patient had received nitroglycerin in the emergency room, but it  was stopped and we restarted it in the cath lab.           ______________________________  Madaline Savage, M.D.     WHG/MEDQ  D:  02/12/2009  T:  02/12/2009  Job:  [id number]   cc:   Madaline Savage, M.D.  Della Goo, M.D.

## 2011-05-13 NOTE — Cardiovascular Report (Signed)
[name], [name] NO.:  [id number]   MEDICAL RECORD NO.:  [id number]          PATIENT TYPE:  INP   LOCATION:  2508                         FACILITY:  [facility]   PHYSICIAN:  Madaline Savage, M.D.DATE OF BIRTH:  08/05/1961   DATE OF PROCEDURE:  02/08/2009  DATE OF DISCHARGE:                            CARDIAC CATHETERIZATION   PROCEDURES PERFORMED:  1. Selective coronary angiography by Judkins technique.  2. Retrograde left heart catheterization.  3. Left ventricular angiography.  4. Intracoronary artery stenting directly with a 2.75 x 23 mm [place]      Voyager stent.  5. Right percutaneous femoral artery Angio-Seal closure.   PATIENT PROFILE:  [name] is a very pleasant 50 year old white  female, who presented to the [facility] Emergency Room this morning with  complaints of chest pain.  Her EKG in the emergency room at 0458 hours  showed a normal EKG and no evidence of ischemia.  Her chest pain was  described as an upper sternal discomfort that sounded very anginal to  me, and we made arrangements for her to come to the cardiac cath lab.  Upon arrival, the patient was placed on the cath lab table and cardiac  catheterization was ultimately performed.   PAST HISTORY:  She has obstructive sleep apnea, hypertension,  hyperlipidemia, esophageal reflux disorder, COPD, asthma, and  depression.  She is roughly 5 feet 4 inches tall and weighs 245 pounds.  Her troponin in the emergency room was less than 0.05.  Her chest x-ray  showed mild chronic bronchitis.   She is a married woman, who is a former smoker.  She smoked 1 pack a day  for 7 years, but quit 8-9 years ago.   RESULTS PRESSURES:  The left ventricular pressure showed no aortic valve  gradient by pullback technique.  The reader is referred to the chart for  the actual values.   ANGIOGRAPHIC RESULTS:  The patient's left main coronary artery is short  and normal.  LAD coursed to the cardiac apex and  gives rise to 2  diagonal branches.  There is a lot of tortuosity in the LAD, but it is a  normal vessel.  There are approximately 5-6 septal perforator branches  that are also normal.  No disease is seen in the LAD.   The circumflex coronary artery is nondominant.  It gives rise to a  posterolateral branch distally and 2 small obtuse marginal branches in  the midportion of the vessel.  No lesions seen.   The right coronary artery is a dominant vessel with circulation which  gives rise to a huge pulmonary conus branch, a posterior descending, and  posterolateral branch, and there is a 95% eccentric stenosis in the  distal RCA about 15-mm from the bifurcation of the PLA and PDA.  It is  eccentric.  It is hypodense.   Left ventricular angiogram showed vigorous contractility of all wall  segments.  I estimated ejection fraction at 65-70%.  No mitral  regurgitation seen.  No LV thrombus.   Percutaneous intervention was accomplished by the right percutaneous  femoral arterial approach using a 6-French  left Judkins guide catheter,  a Prowater wire and we direct stented her stenotic 95% stenosis in the  distal LAD with a 2.75 x 23 mm Voyager [place] stent with inflation pressures  up to about 14 atmospheres of pressure.  We then used a Voyager  noncompliant balloon to postdilate her stent and that noncompliant  balloon inflation pressure was approximately 16 atmospheres.   The patient tolerated this procedure well.  The stenotic vessel of 95%  was reduced to 0% residual and there was preservation of TIMI 3 flow in  the distal RCA.   FINAL IMPRESSION:  1. Chest pain, onset this morning.  2. Multiple cardiac risk factors.  3. Single-vessel coronary arterial disease of the distal right      coronary artery 95%.  4. Successful percutaneous direct coronary artery stenting of the      distal right coronary artery with reduction of the 95% stenosis to      0% residual.  5. Successful Angio-Seal  closure device to the right femoral artery.   PLAN:  The patient will be transferred to unit 2500, and it should be  noted that the patient received Angiomax during the procedure, Plavix,  aspirin, and the patient had received nitroglycerin in the emergency  room, but it was stopped, and we restarted it in the cath lab.            ______________________________  Madaline Savage, M.D.     WHG/MEDQ  D:  02/08/2009  T:  02/09/2009  Job:  [id number]   cc:   Della Goo, M.D.

## 2011-05-13 NOTE — H&P (Signed)
NAMEKAYLN, GARCEAU NO.:  [id number]   MEDICAL RECORD NO.:  [id number]          PATIENT TYPE:  INP   LOCATION:  1824                         FACILITY:  [facility]   PHYSICIAN:  Massie Maroon, MD        DATE OF BIRTH:  September 22, 1961   DATE OF ADMISSION:  02/08/2009  DATE OF DISCHARGE:                              HISTORY & PHYSICAL   CHIEF COMPLAINT:  Chest pain.   HISTORY OF PRESENT ILLNESS:  A 50 year old female with a history of  hypertension, hyperlipidemia, COPD complains of waking up with her head  pounding and then having chest pain like gas in my chest.  She took  Tylenol but chest pain continued and it radiated to her neck as well as  to her back.  She described it as a sharp pain.  She had an episode of  nausea and emesis, no blood.  She had a slight bowel movement in the  bathroom and then called EMS.  She was given sublingual nitro en route  and after 3-4 minutes, felt slightly better but it did not completely  relieve the chest pain.  She had some associated shortness of breath  with this chest pain as well as slight diaphoresis.  EKG showed normal  sinus rhythm at 85, borderline first-degree AV block, prolonged QTc at  454, normal axis, slight PR depression in the inferior leads.  The  patient's chest x-ray showed mild chronic bronchitis, the patient will  be admitted for workup of chest pain.   PAST MEDICAL HISTORY:  1. Hypertension.  2. Hyperlipidemia.  3. GERD.  4. COPD/asthma.  5. Depression.  6. Probable sleep apnea.   PAST SURGICAL HISTORY:  1. Tubal ligation.  2. Carpal tunnel  3. Cholecystectomy.  4. History of hernia, incarcerated ventral hernia repair.   SOCIAL HISTORY:  The patient is married.  She is a former smoker.  She  smoked one pack per day x27 years.  She quit about 8-9 years ago.  She  does not drink.  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   FAMILY HISTORY:  Positive for hypertension, cancer, diabetes.   ALLERGIES:  HYDROCODONE causes GI upset.   MEDICATIONS:  1. Lasix 20 mg p.o. daily.  2. Potassium chloride 20 mEq p.o. daily.  3. Benicar/hydrochlorothiazide 40/25 mg p.o. daily.  4. Citalopram 40 mg p.o. daily.  5. Lipitor 40 mg p.o. nightly.  6. Advair 250/50 one puff b.i.d.  7. Combivent.   REVIEW OF SYSTEMS:  Negative for all 10-organ systems except for  pertinent positives as stated above.   PHYSICAL EXAMINATION:  GENERAL:  Temperature 97.0, pulse 83, blood  pressure 159/80, respiratory rate 20, pulse oximetry 95% on room air.  HEENT:  Anicteric, EOMI, no nystagmus.  Pupils 1.5 mm, symmetric,  direct, consensual, near reflexes intact.  Mucous membranes moist.  Oropharynx, tongue midline.  NECK:  No JVD.  No bruit.  No thyromegaly.  HEART:  Regular rate and rhythm.  S1 and S2.  No murmurs, gallops, or  rubs.  LUNGS:  Clear to auscultation bilaterally.  ABDOMEN:  Soft, obese, nontender, nondistended.  Positive bowel sounds.  EXTREMITIES:  No cyanosis, clubbing, or edema.  NEUROLOGIC:  Cranial nerves II through XII intact, reflexes 2+,  symmetric, diffuse with downgoing toes bilaterally.  Motor strength 5/5  in all 4 extremities.  Pinprick intact.  SKIN:  No rashes.  LYMPH NODES:  No adenopathy.   LABORATORY DATA:  WBC 11.3, hemoglobin 13.5, platelet count 248.  Troponin I less than 0.05.  Chest x-ray, mild chronic bronchitis.  EKGs,  see above.   ASSESSMENT:  1. Chest pain.  2. Hypertension.  3. Hyperlipidemia.  4. Gastroesophageal reflux disease.  5. Chronic obstructive pulmonary disease/asthma.  6. Probable sleep apnea.   PLAN:  Chest pain.  Because the patient complains of chest pain  radiating to back, we will obtain a CTA chest to rule out PE as well as  any aortic dissection.  For now, the patient will be placed on aspirin,  Lipitor, and we are not going to use any beta-blocker at this time  because of hypotension that she had in the ED, while on Tridil.  The   patient has had Tridil turned off and is being given a normal saline  bolus of 250 normal saline IV x1.  Blood pressure was 82/49.  The  patient will be placed on telemetry.  We will check troponin I q.8 h. x3  sets.  If troponins are negative, then consideration will be given to a  stress test.  We will consult Cardiology in regards to chest pain.      Massie Maroon, MD  Electronically Signed     JYK/MEDQ  D:  02/08/2009  T:  02/08/2009  Job:  [id number]   cc:   Della Goo, M.D.

## 2011-05-16 NOTE — Op Note (Signed)
[facility]  Patient:    [name], [name] Visit Number: [id number] MRN: [id number]          Service Type: SUR Location: 3W 0345 01 Attending Physician:  Arlis Porta Dictated by:   Adolph Pollack, M.D. Proc. Date: 12/28/01 Admit Date:  12/28/2001   CC:         Dellis Anes. Idell Pickles, M.D.   Operative Report  PREOPERATIVE DIAGNOSIS:  Incarcerated ventral hernia.  POSTOPERATIVE DIAGNOSIS:  Incarcerated ventral hernia.  OPERATION PERFORMED:  Laparoscopic repair of incarcerated ventral hernia with mesh.  SURGEON:  Adolph Pollack, M.D.  ASSISTANT:  Earna Coder, M.D.  ANESTHESIA:  General.  INDICATIONS FOR PROCEDURE:  Ms. Woodbeck is a 50 year old female who has noticed a progressively enlarging bulge in the superumbilical area.  I saw her January 2000 in the emergency department.  She had a CT scan done which showed a small hernia.  I recommended that she have repair at that time, but she has held off until recently when now she has been having more pain.  She presents for elective hernia repair.  DESCRIPTION OF PROCEDURE:  She was placed supine on the operating table and a general anesthetic was administered.  Her abdomen was sterilely prepped and draped.  A small lower midline incision was made incising the skin and subcutaneous tissue sharply.  The midline fascia was then identified and a small incision made in it.  A pursestring suture of 0 Vicryl was placed around the fascial edges.  The peritoneal cavity was entered bluntly and a Hasson trocar was introduced into the peritoneal cavity.  A pneumoperitoneum was created by insufflation of CO2 gas.  Next a 30 degree laparoscope was introduced.  I noticed incarcerated omentum up in an epigastric ventral hernia.  Under direct vision, a 5 mm trocar was placed in the right lower quadrant and one in the left lower quadrant.  The omentum was grasped and reduced from the  hernia.  There was some residual omentum up in the hernia that I also reduced.  There were a number of fatty attachments to the posterior abdominal fascia.  This dissected free bluntly. I then identified the rims of the edges of the hernia and used spinal needles to mark the edges of the hernia in four quadrants.  I measured at 3 to 4 cm radially from the spinal needles and drew an oval.  The hernia measured approximately 4 cm x 4 cm.  Next, I drew an oval and brought in an appropriate sized piece of Gore-Tex Dual mesh into the field.  It was cut to match the oval.  Numbers were placed in four quadrants on it and then four stay sutures of 0 Novofil were placed in four quadrants.  Stab incisions were placed in four quadrants around the oval drawn on the abdominal wall.  The mesh was then put into the abdominal cavity.  The rough side of the mesh was then placed so it was facing the anterior abdominal wall fascia.  I then grasped each of the stay sutures across the fascial bridge in each of the four quadrants and brought the mesh up to the anterior abdominal wall.  I anchored it by tying down the sutures.  I further anchored the mesh using the spiral tacker placing both an outer layer of tacks around the edge of the mesh and an inner layer. This more than adequately covered the defect.  In order to do this adequately, I added  an extra 5 mm trocar in the left upper quadrant.  I inspected the area and no bleeding was noted and again the mesh covered the defect appropriately.  I then removed the trocars and released the pneumoperitoneum.  I closed the lower midline fascial defect by tightening up and tying down the pursestring suture.  I then closed all skin incisions with 4-0 Monocryl subcuticular stitches.  Steri-Strips and sterile dressings were applied.  The patient tolerated the procedure well without any apparent complications and was taken to recovery in satisfactory  condition. Dictated by:   Adolph Pollack, M.D. Attending Physician:  Arlis Porta DD:  12/28/01 TD:  12/28/01 Job: [id number]

## 2011-10-03 ENCOUNTER — Emergency Department (HOSPITAL_COMMUNITY): Payer: Medicaid Other

## 2011-10-03 ENCOUNTER — Emergency Department (HOSPITAL_COMMUNITY)
Admission: EM | Admit: 2011-10-03 | Discharge: 2011-10-03 | Disposition: A | Discharge status: Home / Self Care | Payer: Medicaid Other | Attending: Emergency Medicine | Admitting: Emergency Medicine

## 2011-10-03 DIAGNOSIS — R112 Nausea with vomiting, unspecified: Secondary | ICD-10-CM | POA: Insufficient documentation

## 2011-10-03 DIAGNOSIS — I10 Essential (primary) hypertension: Secondary | ICD-10-CM | POA: Insufficient documentation

## 2011-10-03 DIAGNOSIS — E119 Type 2 diabetes mellitus without complications: Secondary | ICD-10-CM | POA: Insufficient documentation

## 2011-10-03 DIAGNOSIS — E785 Hyperlipidemia, unspecified: Secondary | ICD-10-CM | POA: Insufficient documentation

## 2011-10-03 DIAGNOSIS — F411 Generalized anxiety disorder: Secondary | ICD-10-CM | POA: Insufficient documentation

## 2011-10-03 DIAGNOSIS — R197 Diarrhea, unspecified: Secondary | ICD-10-CM | POA: Insufficient documentation

## 2011-10-03 DIAGNOSIS — Z79899 Other long term (current) drug therapy: Secondary | ICD-10-CM | POA: Insufficient documentation

## 2011-10-03 DIAGNOSIS — R109 Unspecified abdominal pain: Secondary | ICD-10-CM | POA: Insufficient documentation

## 2011-10-03 DIAGNOSIS — J45909 Unspecified asthma, uncomplicated: Secondary | ICD-10-CM | POA: Insufficient documentation

## 2011-10-03 LAB — CBC
Platelets: 211 10*3/uL (ref 150–400)
RBC: 4.19 MIL/uL (ref 3.87–5.11)
RDW: 13.2 % (ref 11.5–15.5)
WBC: 9.7 10*3/uL (ref 4.0–10.5)

## 2011-10-03 LAB — POCT I-STAT, CHEM 8
Calcium, Ion: 1.11 mmol/L — ABNORMAL LOW (ref 1.12–1.32)
HCT: 38 % (ref 36.0–46.0)
Hemoglobin: 12.9 g/dL (ref 12.0–15.0)
Sodium: 140 mEq/L (ref 135–145)
TCO2: 26 mmol/L (ref 0–100)

## 2011-10-03 IMAGING — CR DG ABDOMEN ACUTE W/ 1V CHEST
3 series · 3 of 3 positions shown · non-contrast
Comparison: Chest [DATE]

CLINICAL DATA: Shortness of breath and abdominal pain.

ACUTE ABDOMEN SERIES (ABDOMEN 2 VIEW & CHEST 1 VIEW)

[w chest pa]
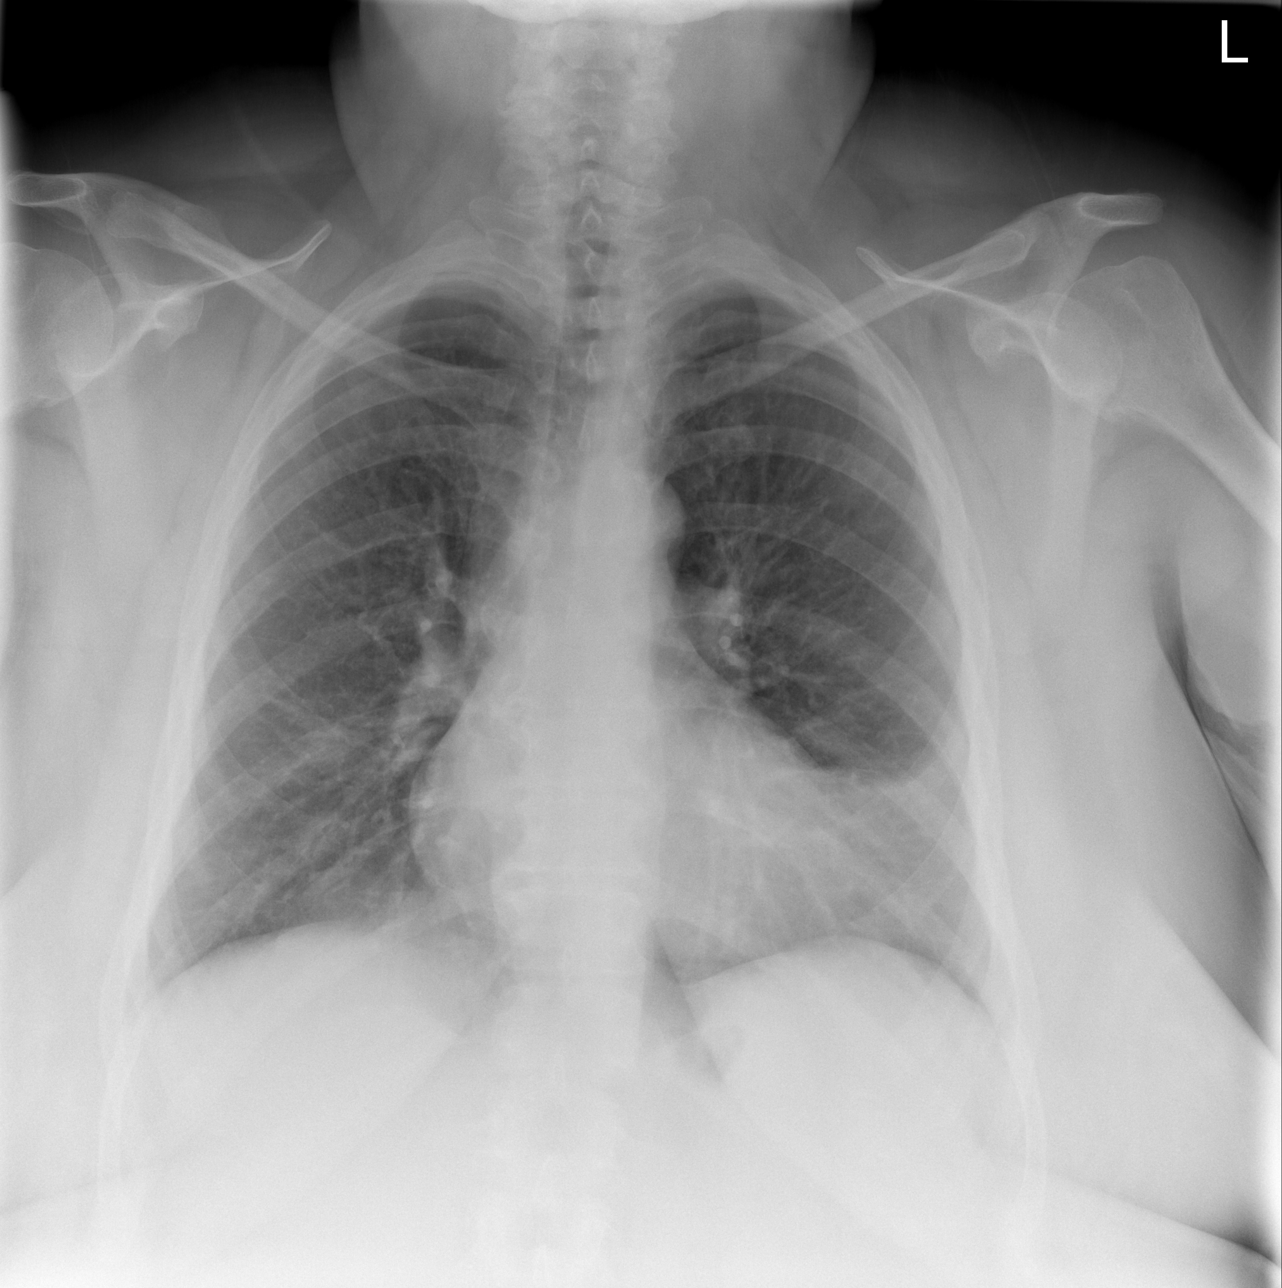

[w abdomen upright]
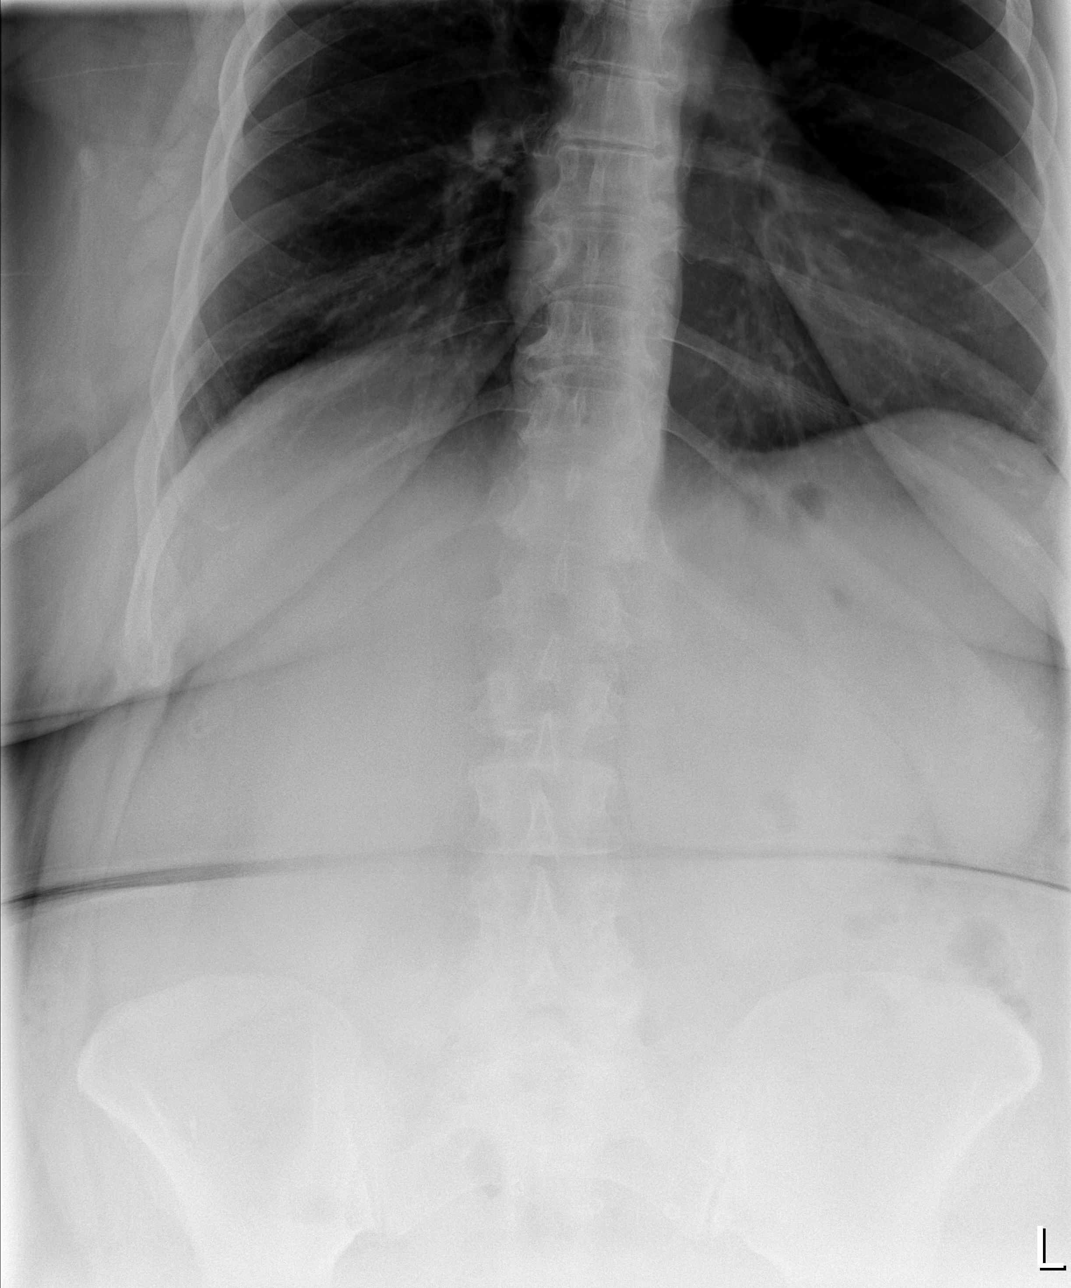

[t abdomen supine *]
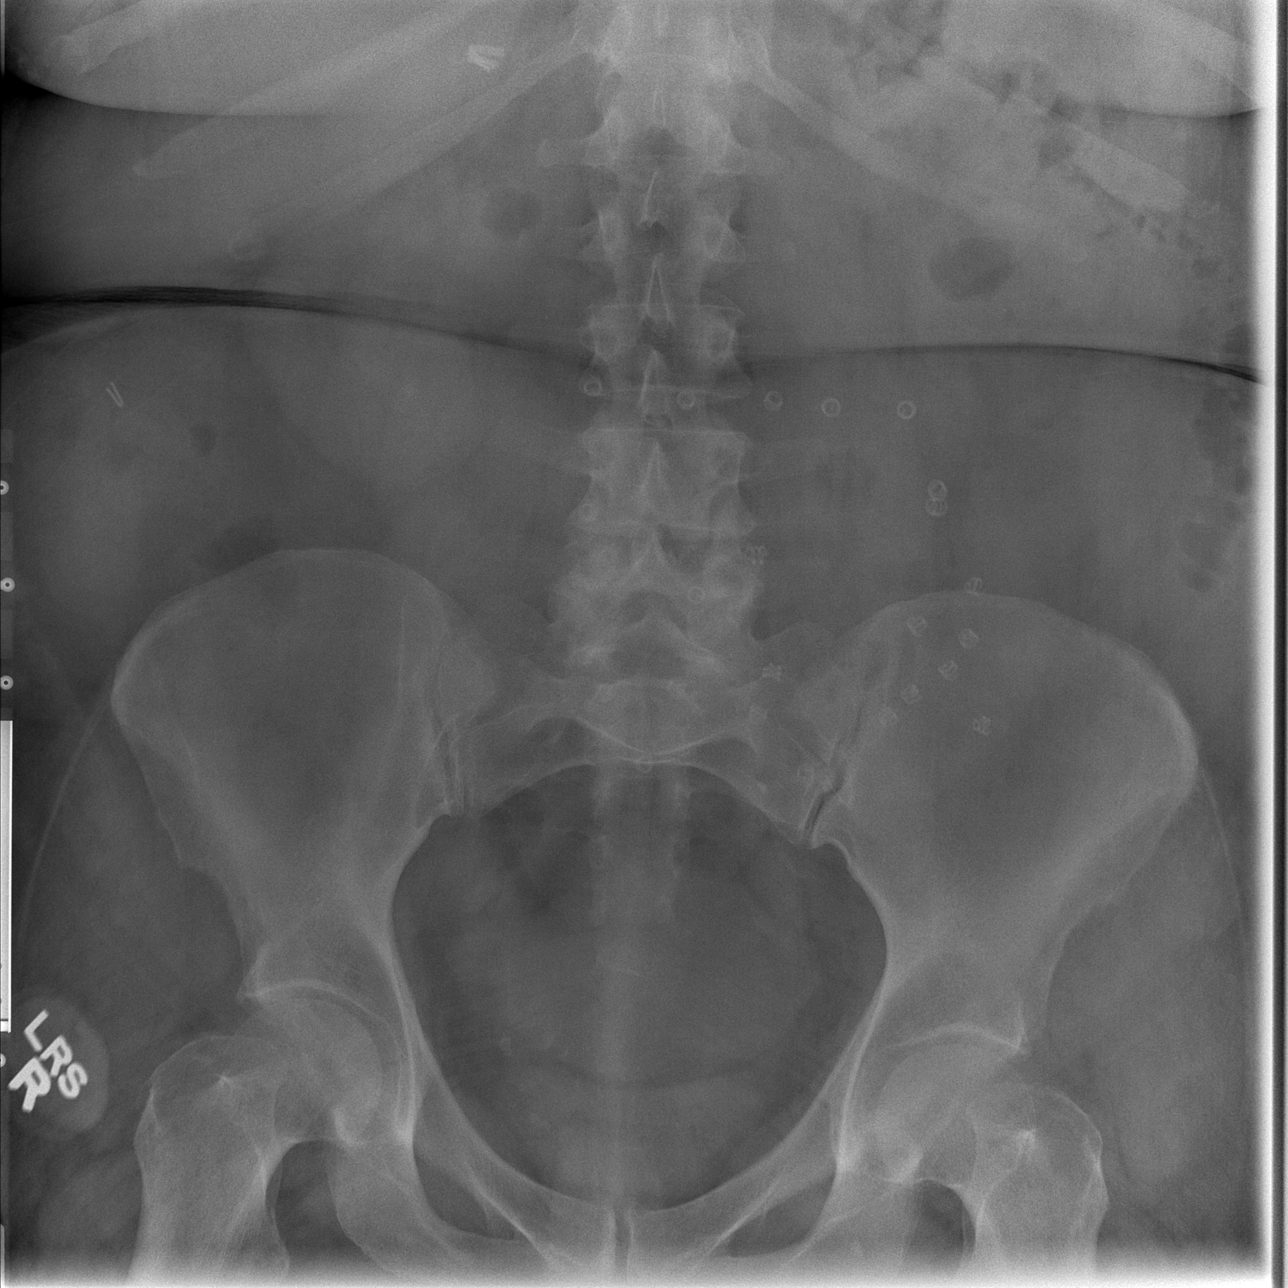

[3 of 3 positions shown; findings below may reference images not displayed]

FINDINGS: Stable appearance of increased density in the lung bases
which has been attributed to prominent fat pads.  Heart size and
pulmonary vascularity are normal.  No focal airspace consolidation
in the lungs.  No blunting of costophrenic angles.  No
pneumothorax.  No significant change since previous film.

Scattered gas within nondistended colon.  No small bowel
dilatation.  No free intra-abdominal air.  No abnormal air fluid
levels.  Postsurgical changes in the mid abdomen consistent with
hernia repair.  Suggestion of a vague calcification over the lower
pole right kidney which might represent a renal stone.
IMPRESSION: No evidence of active pulmonary disease.  No evidence of active
pulmonary disease.  Possible calcification over the lower pole
right kidney.

## 2011-11-19 ENCOUNTER — Emergency Department (HOSPITAL_COMMUNITY)
Admission: EM | Admit: 2011-11-19 | Discharge: 2011-11-20 | Disposition: A | Discharge status: Home / Self Care | Payer: Medicaid Other | Attending: Emergency Medicine | Admitting: Emergency Medicine

## 2011-11-19 ENCOUNTER — Emergency Department (HOSPITAL_COMMUNITY): Payer: Medicaid Other

## 2011-11-19 ENCOUNTER — Encounter (HOSPITAL_COMMUNITY): Payer: Self-pay | Admitting: Emergency Medicine

## 2011-11-19 DIAGNOSIS — I10 Essential (primary) hypertension: Secondary | ICD-10-CM | POA: Insufficient documentation

## 2011-11-19 DIAGNOSIS — J45909 Unspecified asthma, uncomplicated: Secondary | ICD-10-CM | POA: Insufficient documentation

## 2011-11-19 DIAGNOSIS — R0602 Shortness of breath: Secondary | ICD-10-CM | POA: Insufficient documentation

## 2011-11-19 DIAGNOSIS — Z79899 Other long term (current) drug therapy: Secondary | ICD-10-CM | POA: Insufficient documentation

## 2011-11-19 DIAGNOSIS — R059 Cough, unspecified: Secondary | ICD-10-CM | POA: Insufficient documentation

## 2011-11-19 DIAGNOSIS — E119 Type 2 diabetes mellitus without complications: Secondary | ICD-10-CM | POA: Insufficient documentation

## 2011-11-19 DIAGNOSIS — R05 Cough: Secondary | ICD-10-CM

## 2011-11-19 DIAGNOSIS — M7989 Other specified soft tissue disorders: Secondary | ICD-10-CM | POA: Insufficient documentation

## 2011-11-19 HISTORY — DX: Essential (primary) hypertension: I10

## 2011-11-19 LAB — BASIC METABOLIC PANEL
BUN: 16 mg/dL (ref 6–23)
CO2: 26 mEq/L (ref 19–32)
Chloride: 101 mEq/L (ref 96–112)
GFR calc Af Amer: 76 mL/min — ABNORMAL LOW (ref >90)
Potassium: 3.3 mEq/L — ABNORMAL LOW (ref 3.5–5.1)

## 2011-11-19 LAB — DIFFERENTIAL
Basophils Relative: 0 % (ref 0–1)
Lymphocytes Relative: 30 % (ref 12–46)
Lymphs Abs: 2.8 10*3/uL (ref 0.7–4.0)
Monocytes Relative: 7 % (ref 3–12)
Neutro Abs: 5.5 10*3/uL (ref 1.7–7.7)
Neutrophils Relative %: 59 % (ref 43–77)

## 2011-11-19 LAB — CBC
HCT: 32.2 % — ABNORMAL LOW (ref 36.0–46.0)
Hemoglobin: 11.1 g/dL — ABNORMAL LOW (ref 12.0–15.0)
MCHC: 34.5 g/dL (ref 30.0–36.0)
RBC: 3.74 MIL/uL — ABNORMAL LOW (ref 3.87–5.11)
WBC: 9.2 10*3/uL (ref 4.0–10.5)

## 2011-11-19 IMAGING — CR DG CHEST 2V
2 series · 2 of 2 positions shown · non-contrast
Comparison: CT chest [DATE].  Plain films of the chest
[DATE] and [DATE].

CLINICAL DATA: Shortness of breath and cough.

CHEST - 2 VIEW

[w chest pa]
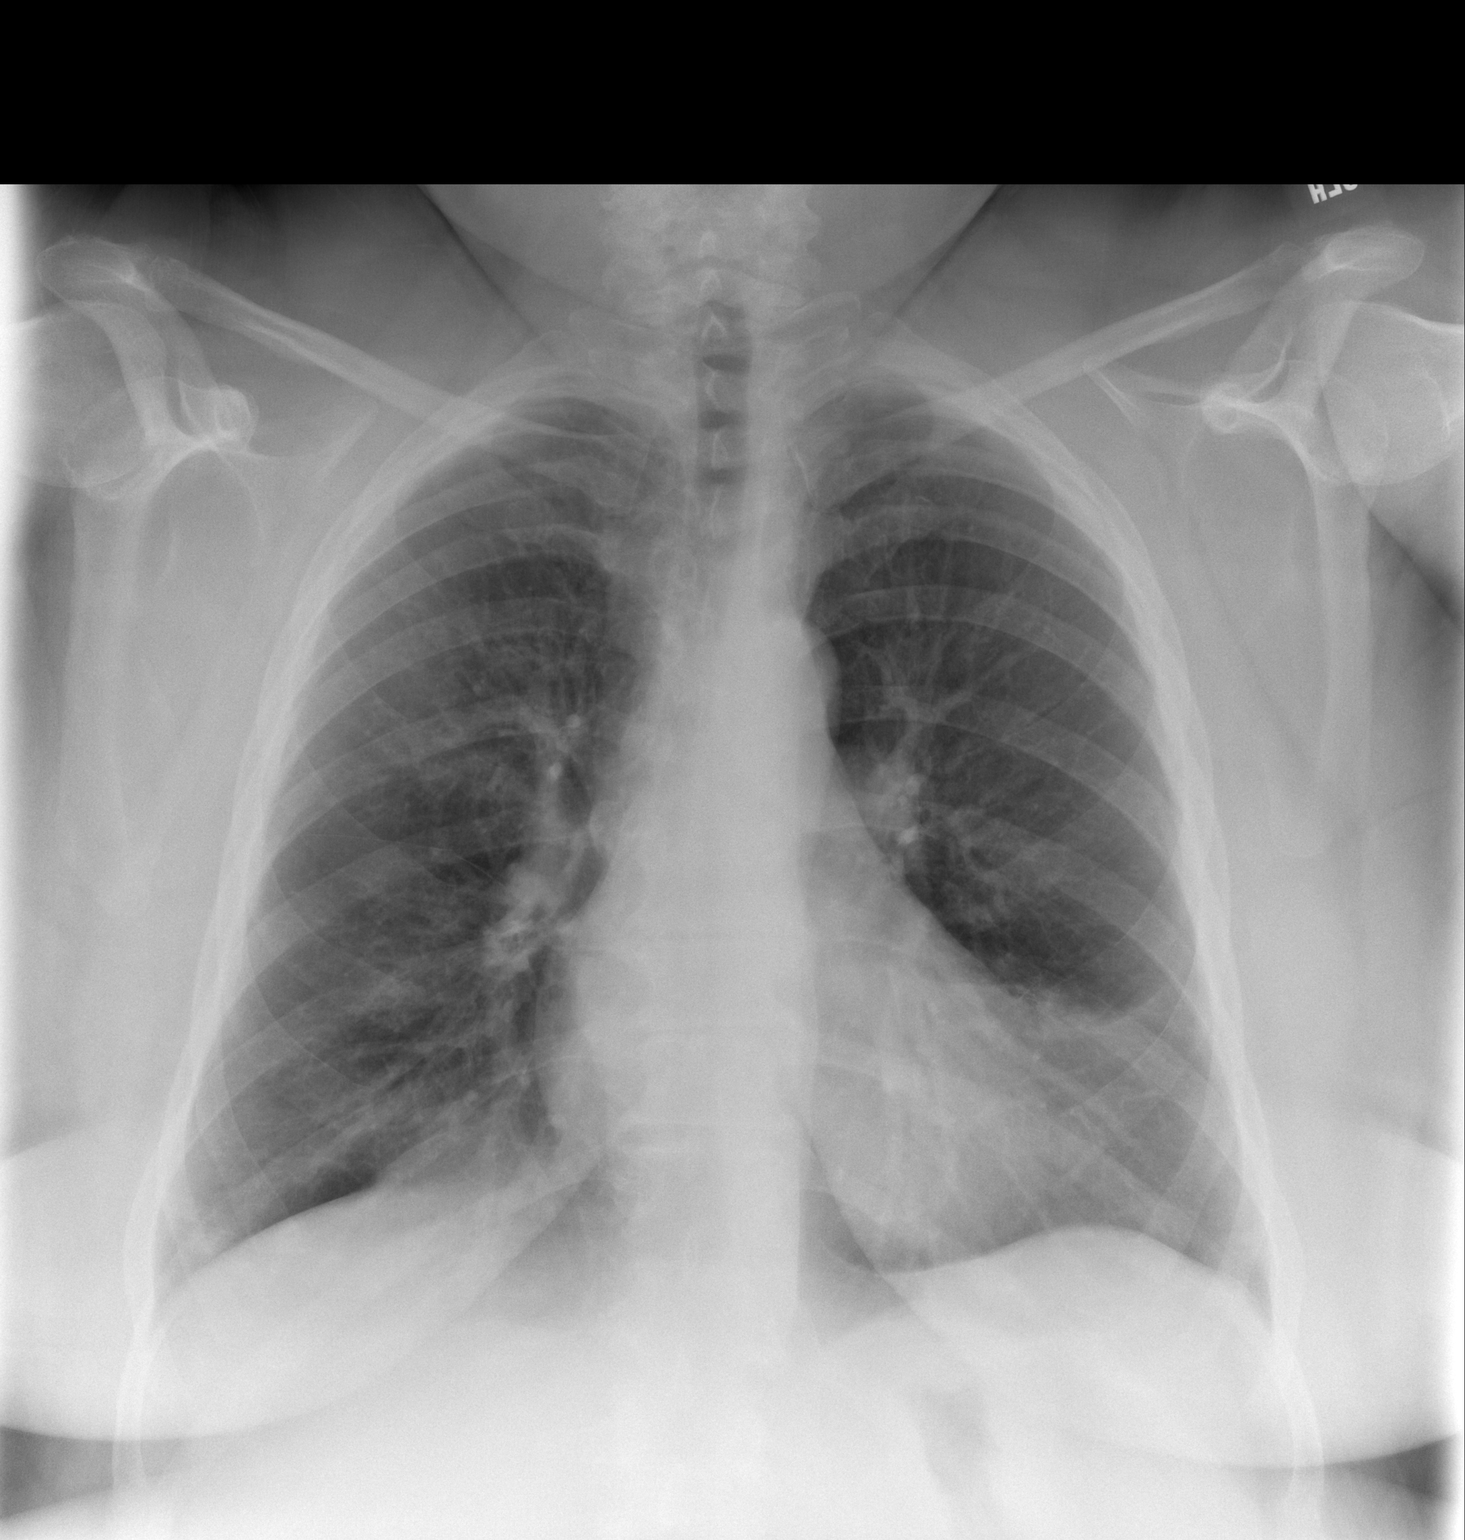

[w chest lat *]
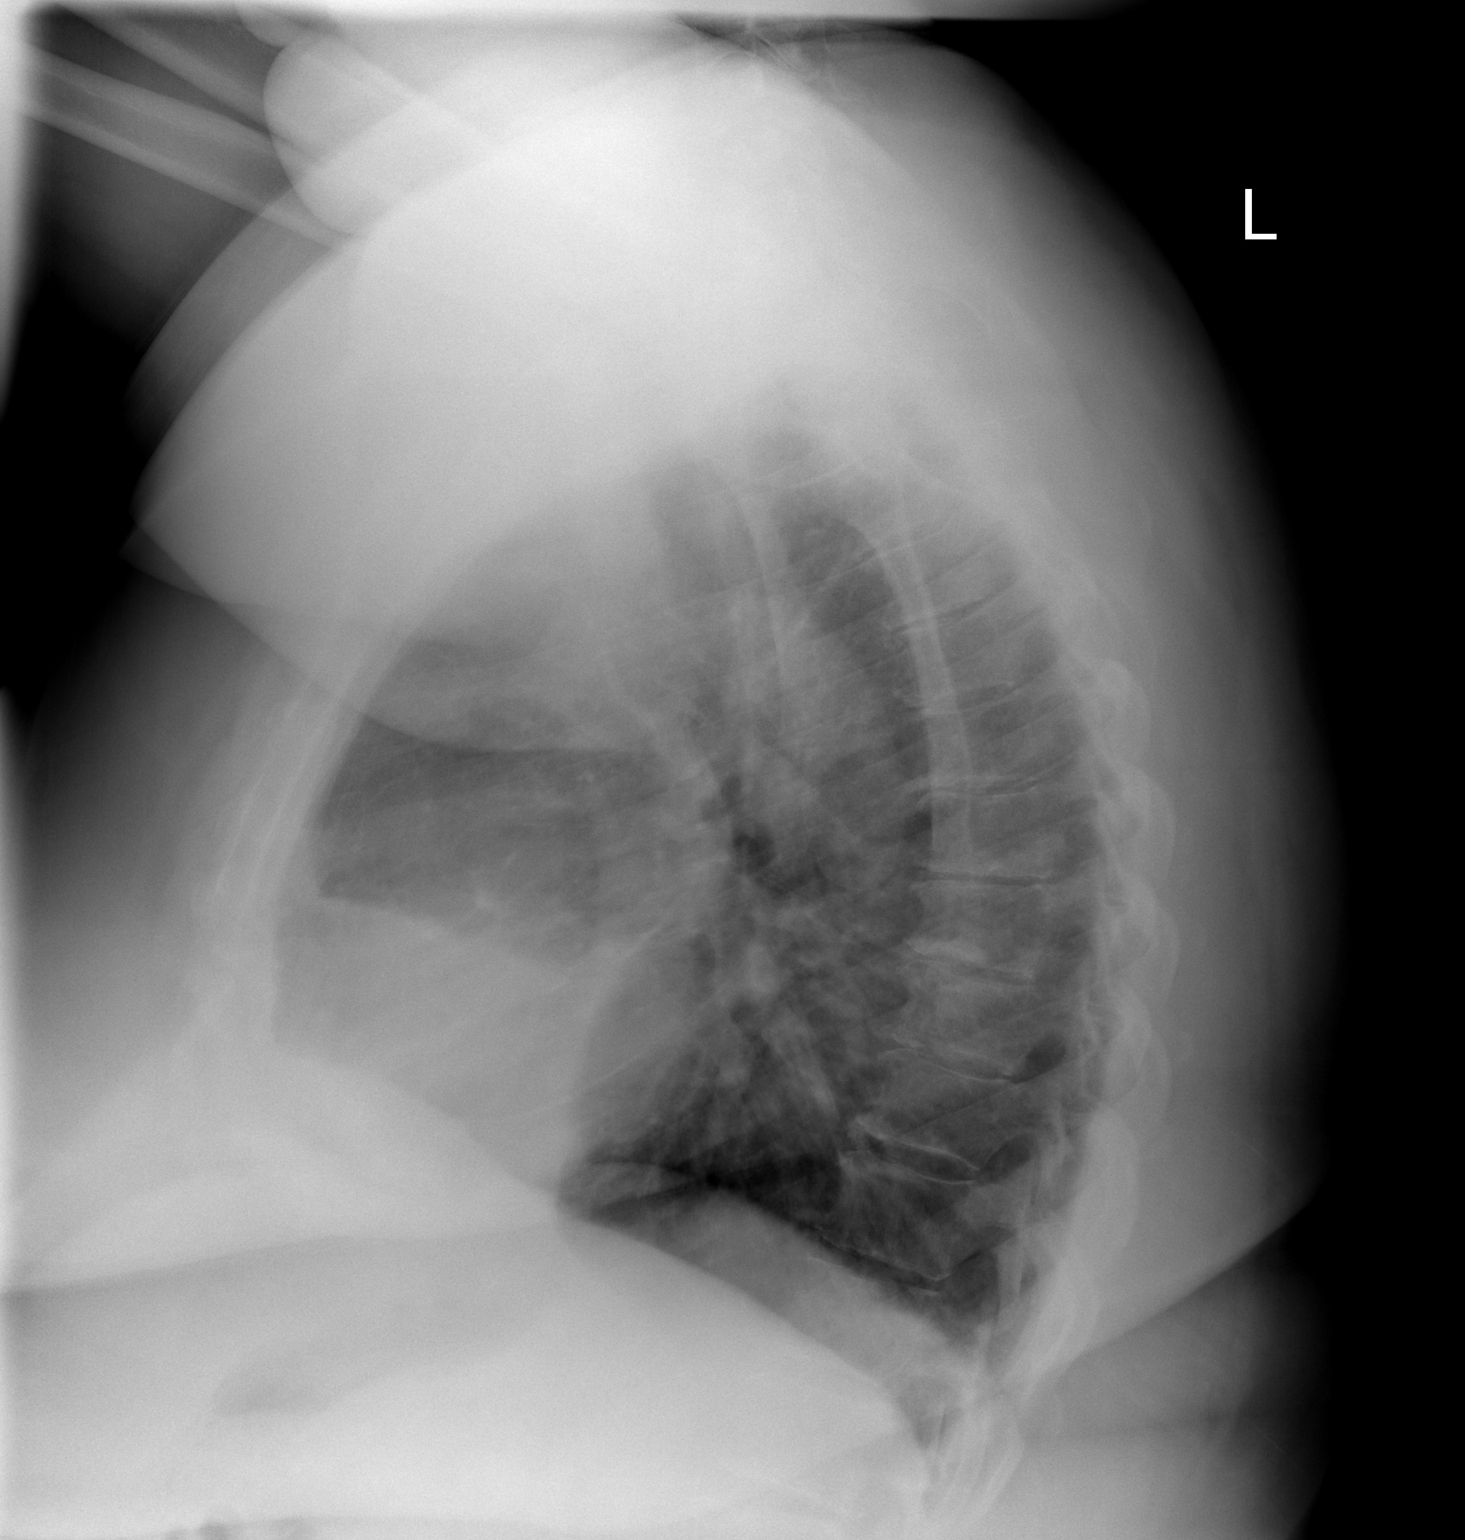

[2 of 2 positions shown; findings below may reference images not displayed]

FINDINGS: Lungs are clear.  Prominent epicardial fat pad on the
left noted.  No pneumothorax or effusion.
IMPRESSION: No acute disease.

## 2011-11-19 MED ORDER — POTASSIUM CHLORIDE CRYS ER 20 MEQ PO TBCR
40.0000 meq | EXTENDED_RELEASE_TABLET | Freq: Once | ORAL | Status: AC
  Administered 2011-11-19: 40 meq via ORAL
  Filled 2011-11-19 (×2): qty 1

## 2011-11-19 MED ORDER — ALBUTEROL SULFATE (5 MG/ML) 0.5% IN NEBU
5.0000 mg | INHALATION_SOLUTION | Freq: Once | RESPIRATORY_TRACT | Status: AC
  Administered 2011-11-19: 5 mg via RESPIRATORY_TRACT
  Filled 2011-11-19: qty 1

## 2011-11-19 MED ORDER — IPRATROPIUM BROMIDE 0.02 % IN SOLN
0.5000 mg | Freq: Once | RESPIRATORY_TRACT | Status: AC
  Administered 2011-11-19: 0.5 mg via RESPIRATORY_TRACT
  Filled 2011-11-19: qty 2.5

## 2011-11-19 NOTE — ED Provider Notes (Signed)
History     CSN: [id number] Arrival date & time: 11/19/2011  6:43 PM   First MD Initiated Contact with Patient 11/19/11 2227      Chief Complaint  Patient presents with  . Shortness of Breath   HPI  History provided by the patient and spouse. Patient with history of asthma presents with nonproductive cough for the past several days and increase shortness of breath. Patient reports increased use of albuterol inhaler at home without improvement. Patient denies chest pain, palpitations, hemoptysis. She denies fever chills sweats. No nausea or vomiting. No diaphoresis. Patient reports history of flulike symptoms 2 weeks ago but reports recovering well. Patient denies any recent travel or surgery. No history of DVT or PE in the past.   Past Medical History  Diagnosis Date  . Asthma   . Hypertension   . Diabetes mellitus     No past surgical history on file.  No family history on file.  History  Substance Use Topics  . Smoking status: Never Smoker   . Smokeless tobacco: Not on file  . Alcohol Use: No    OB History    Grav Para Term Preterm Abortions TAB SAB Ect Mult Living                  Review of Systems  Constitutional: Negative for fever, chills and fatigue.  Respiratory: Positive for cough and shortness of breath. Negative for chest tightness.   Cardiovascular: Negative for chest pain and palpitations.  Gastrointestinal: Negative for nausea, vomiting and abdominal pain.  Genitourinary: Negative for dysuria, frequency and hematuria.  Neurological: Negative for weakness.  All other systems reviewed and are negative.    Allergies  Hydrocodone  Home Medications   Current Outpatient Rx  Name Route Sig Dispense Refill  . ALBUTEROL SULFATE HFA 108 (90 BASE) MCG/ACT IN AERS Inhalation Inhale 1 puff into the lungs every 6 (six) hours as needed. Shortness of breath.     . ATORVASTATIN CALCIUM 40 MG PO TABS Oral Take 40 mg by mouth daily.      Marland Kitchen CLOPIDOGREL BISULFATE  75 MG PO TABS Oral Take 75 mg by mouth daily.      Marland Kitchen DICLOFENAC SODIUM 25 MG PO TBEC Oral Take 25 mg by mouth 2 (two) times daily.      Marland Kitchen FLUTICASONE-SALMETEROL 250-50 MCG/DOSE IN AEPB Inhalation Inhale 1 puff into the lungs 2 (two) times daily.      Marland Kitchen GABAPENTIN 300 MG PO CAPS Oral Take 300 mg by mouth daily. Patient states she takes it once daily. Per patient.     Marland Kitchen GLIPIZIDE 5 MG PO TABS Oral Take 5 mg by mouth 2 (two) times daily before a meal.      . OLMESARTAN MEDOXOMIL 20 MG PO TABS Oral Take 20 mg by mouth daily.        BP 168/87  Pulse 82  Temp(Src) 97.9 F (36.6 C) (Oral)  Resp 20  SpO2 95%  Physical Exam  Nursing note and vitals reviewed. Constitutional: She is oriented to person, place, and time. She appears well-developed and well-nourished. No distress.  HENT:  Head: Normocephalic and atraumatic.  Neck: No JVD present.  Cardiovascular: Normal rate and regular rhythm.   No murmur heard. Pulmonary/Chest: Effort normal. She has no rales. She exhibits no tenderness.       Slight wheezes on inspiration  Abdominal: Soft. There is no tenderness.  Musculoskeletal:       Mild swelling of lower legs  without pitting.  Neurological: She is alert and oriented to person, place, and time. No cranial nerve deficit.  Skin: Skin is warm.  Psychiatric: She has a normal mood and affect.    ED Course  Procedures (including critical care time)  Labs Reviewed  CBC - Abnormal; Notable for the following:    RBC 3.74 (*)    Hemoglobin 11.1 (*)    HCT 32.2 (*)    All other components within normal limits  BASIC METABOLIC PANEL - Abnormal; Notable for the following:    Potassium 3.3 (*)    GFR calc non Af Amer 66 (*)    GFR calc Af Amer 76 (*)    All other components within normal limits  DIFFERENTIAL   Results for orders placed during the hospital encounter of 11/19/11  CBC      Component Value Range   WBC 9.2  4.0 - 10.5 (K/uL)   RBC 3.74 (*) 3.87 - 5.11 (MIL/uL)    Hemoglobin 11.1 (*) 12.0 - 15.0 (g/dL)   HCT 16.1 (*) 09.6 - 46.0 (%)   MCV 86.1  78.0 - 100.0 (fL)   MCH 29.7  26.0 - 34.0 (pg)   MCHC 34.5  30.0 - 36.0 (g/dL)   RDW 04.5  40.9 - 81.1 (%)   Platelets 221  150 - 400 (K/uL)  DIFFERENTIAL      Component Value Range   Neutrophils Relative 59  43 - 77 (%)   Neutro Abs 5.5  1.7 - 7.7 (K/uL)   Lymphocytes Relative 30  12 - 46 (%)   Lymphs Abs 2.8  0.7 - 4.0 (K/uL)   Monocytes Relative 7  3 - 12 (%)   Monocytes Absolute 0.6  0.1 - 1.0 (K/uL)   Eosinophils Relative 3  0 - 5 (%)   Eosinophils Absolute 0.3  0.0 - 0.7 (K/uL)   Basophils Relative 0  0 - 1 (%)   Basophils Absolute 0.0  0.0 - 0.1 (K/uL)  BASIC METABOLIC PANEL      Component Value Range   Sodium 138  135 - 145 (mEq/L)   Potassium 3.3 (*) 3.5 - 5.1 (mEq/L)   Chloride 101  96 - 112 (mEq/L)   CO2 26  19 - 32 (mEq/L)   Glucose, Bld 93  70 - 99 (mg/dL)   BUN 16  6 - 23 (mg/dL)   Creatinine, Ser 9.14  0.50 - 1.10 (mg/dL)   Calcium 9.1  8.4 - 78.2 (mg/dL)   GFR calc non Af Amer 66 (*) >90 (mL/min)   GFR calc Af Amer 76 (*) >90 (mL/min)     Dg Chest 2 View  11/19/2011  *RADIOLOGY REPORT*  Clinical Data: Shortness of breath and cough.  CHEST - 2 VIEW  Comparison: CT chest 10/09/2009.  Plain films of the chest 01/04/2011 and 02/08/2009.  Findings: Lungs are clear.  Prominent epicardial fat pad on the left noted.  No pneumothorax or effusion.  IMPRESSION: No acute disease.  Original Report Authenticated By: Bernadene Bell. D'ALESSIO, M.D.     1. Cough   2. Asthma      MDM  Patient seen and evaluated. Patient in no acute distress. Patient with normal respirations and oxygen saturations.  Patient reports feeling much better after breathing treatment. Patient continues to have normal respirations and good O2 sats on the monitor. At this time patient ready to return home. Plan to have patient followup with Dr. Lovell Sheehan next week.  Angus Seller, [place] 11/19/11 [phone]

## 2011-11-19 NOTE — ED Notes (Signed)
PT. REPORTS SOB WITH OCCASIONAL DRY COUGH ONSET LAST NIGHT , NO FEVER OR CHILLS,  GENERALIZED WEAKNESS , NO NAUSEA OR VOMITTING.

## 2011-11-20 NOTE — ED Provider Notes (Signed)
Medical screening examination/treatment/procedure(s) were performed by non-physician practitioner and as supervising physician I was immediately available for consultation/collaboration.  Libia Fazzini K Soren Pigman-Rasch, MD 11/20/11 0042 

## 2011-12-08 ENCOUNTER — Encounter: Payer: Self-pay | Admitting: Cardiovascular Disease

## 2011-12-08 ENCOUNTER — Ambulatory Visit (INDEPENDENT_AMBULATORY_CARE_PROVIDER_SITE_OTHER): Payer: Medicaid Other | Admitting: Cardiovascular Disease

## 2011-12-08 VITALS — BP 143/82 | HR 92 | Resp 12 | Ht 63.0 in | Wt 342.0 lb

## 2011-12-08 DIAGNOSIS — R0609 Other forms of dyspnea: Secondary | ICD-10-CM

## 2011-12-08 DIAGNOSIS — I2510 Atherosclerotic heart disease of native coronary artery without angina pectoris: Secondary | ICD-10-CM

## 2011-12-08 DIAGNOSIS — R0600 Dyspnea, unspecified: Secondary | ICD-10-CM | POA: Insufficient documentation

## 2011-12-08 DIAGNOSIS — R0989 Other specified symptoms and signs involving the circulatory and respiratory systems: Secondary | ICD-10-CM

## 2011-12-08 NOTE — Assessment & Plan Note (Signed)
Multi-factorial given her obesity, OSA, asthma. Will get echo to evaluate LVEF and exclude valvular heart disease.

## 2011-12-08 NOTE — Progress Notes (Signed)
History of Present Illness: 50 yo WF with history of CAD, OSA, HTN, HLD, obesity, GERD, asthma, COPD, former tobacco abuse, depression here today to establish cardiology care. She has been seen in the past in [facility] and Vascular. She had a cardiac cath in February 2010 and had a drug eluting stent (2.60mm Xience) placed in the distal RCA per Dr. Elsie Lincoln. Her LAD and Circumflex were free of disease.   She is here to establish with me today. She has had no chest pains. She has baseline SOB and she thinks this has gotten worse. She is 5'3", 342 lbs. No dizziness, near syncope or syncope. She has been out of her BP meds for 3 weeks.   Her primary care is Dr. Della Goo.   Past Medical History  Diagnosis Date  . Asthma   . Hypertension   . Diabetes mellitus   . CAD (coronary artery disease)     Cath 2010 with DES x 1 RCA  . Hyperlipidemia   . Obstructive sleep apnea   . GERD (gastroesophageal reflux disease)   . Depression     Past Surgical History  Procedure Date  . Tubal ligation   . Carpal tunnel release     Bilateral  . Cholecystectomy   . Hernia repair     Current Outpatient Prescriptions  Medication Sig Dispense Refill  . albuterol (PROVENTIL HFA;VENTOLIN HFA) 108 (90 BASE) MCG/ACT inhaler Inhale 1 puff into the lungs every 6 (six) hours as needed. Shortness of breath.       Marland Kitchen atorvastatin (LIPITOR) 40 MG tablet Take 40 mg by mouth daily.        . clopidogrel (PLAVIX) 75 MG tablet Take 75 mg by mouth daily.        . diclofenac (VOLTAREN) 25 MG EC tablet Take 25 mg by mouth 2 (two) times daily.        Marland Kitchen escitalopram (LEXAPRO) 20 MG tablet Take 20 mg by mouth daily.        . Fluticasone-Salmeterol (ADVAIR) 250-50 MCG/DOSE AEPB Inhale 1 puff into the lungs 2 (two) times daily.        . furosemide (LASIX) 40 MG tablet Take 40 mg by mouth daily.        Marland Kitchen gabapentin (NEURONTIN) 300 MG capsule Take 300 mg by mouth daily. Patient states she takes it once daily. Per  patient.       Marland Kitchen glipiZIDE (GLUCOTROL) 5 MG tablet Take 5 mg by mouth 2 (two) times daily before a meal.        . olmesartan (BENICAR) 20 MG tablet PT HAVING HARD TIME GETTING BENICAR        Allergies  Allergen Reactions  . Hydrocodone     itching    History   Social History  . Marital Status: Married    Spouse Name: N/A    Number of Children: 2  . Years of Education: N/A   Occupational History  . Disabled    Social History Main Topics  . Smoking status: Former Smoker -- 1.5 packs/day for 30 years    Types: Cigarettes  . Smokeless tobacco: Not on file   Comment:  quit 12 -13 years ago  . Alcohol Use: No  . Drug Use: No  . Sexually Active: Not on file   Other Topics Concern  . Not on file   Social History Narrative  . No narrative on file    Family History  Problem Relation Age of Onset  .  Cancer Mother     Breast  . COPD Father   . Cancer Brother     Sinus    Review of Systems:  As stated in the HPI and otherwise negative.   BP 143/82  Pulse 92  Resp 12  Ht 5\' 3"  (1.6 m)  Wt 342 lb (155.13 kg)  BMI 60.58 kg/m2  Physical Examination: General: Obese, NAD HEENT: OP clear, mucus membranes moist SKIN: warm, dry. No rashes. Neuro: No focal deficits Musculoskeletal: Muscle strength 5/5 all ext Psychiatric: Mood and affect normal Neck: No JVD, no carotid bruits, no thyromegaly, no lymphadenopathy. Lungs:Clear bilaterally, no wheezes, rhonci, crackles Cardiovascular: Regular rate and rhythm. Soft systolic murmur, gallops or rubs. Abdomen:Soft. Bowel sounds present. Non-tender.  Extremities: No lower extremity edema. Pulses are 2 + in the bilateral DP/PT.  Cardiac Cath 02/08/09:  The patient's left main coronary artery is short   and normal.  The left anterior descending coronary artery coursed to the   cardiac apex and gave rise to 2 diagonal branches.  There is a lot of   tortuosity in the left anterior descending coronary artery, but it is a   normal  vessel.  There are approximately 5-6 septal perforator branches   that are also normal.  No disease is seen in the left anterior   descending coronary artery.  The left circumflex coronary artery is   nondominant.  It gives rise to a posterolateral branch distally and 2   small obtuse marginal branches in the midportion of the vessel.  No   lesions were seen.      The right coronary artery is a dominant vessel of the circulation which   gives rise to a huge pulmonary conus branch, a normal size posterior   descending branch, and a posterolateral branch and there is a 95%   eccentric stenosis in the distal RCA about 15 mm from the bifurcation of   the PLA to the PDA.  It is eccentric.  It is hypodense.   EKG: NSR, rate 92 bpm. Low voltage.

## 2011-12-08 NOTE — Patient Instructions (Signed)
Your physician wants you to follow-up in:  6 months. You will receive a reminder letter in the mail two months in advance. If you don't receive a letter, please call our office to schedule the follow-up appointment.  Your physician has requested that you have an echocardiogram. Echocardiography is a painless test that uses sound waves to create images of your heart. It provides your doctor with information about the size and shape of your heart and how well your heart's chambers and valves are working. This procedure takes approximately one hour. There are no restrictions for this procedure.    

## 2011-12-08 NOTE — Assessment & Plan Note (Addendum)
Stable. She has been on Plavix and a statin. She has not been taking an ASA. No chest pain. No changes.

## 2011-12-11 ENCOUNTER — Ambulatory Visit (HOSPITAL_COMMUNITY): Payer: Medicaid Other | Attending: Cardiovascular Disease

## 2011-12-11 DIAGNOSIS — I10 Essential (primary) hypertension: Secondary | ICD-10-CM | POA: Insufficient documentation

## 2011-12-11 DIAGNOSIS — I379 Nonrheumatic pulmonary valve disorder, unspecified: Secondary | ICD-10-CM | POA: Insufficient documentation

## 2011-12-11 DIAGNOSIS — R0600 Dyspnea, unspecified: Secondary | ICD-10-CM

## 2011-12-11 DIAGNOSIS — R0989 Other specified symptoms and signs involving the circulatory and respiratory systems: Secondary | ICD-10-CM

## 2011-12-11 DIAGNOSIS — I079 Rheumatic tricuspid valve disease, unspecified: Secondary | ICD-10-CM | POA: Insufficient documentation

## 2011-12-11 DIAGNOSIS — E785 Hyperlipidemia, unspecified: Secondary | ICD-10-CM | POA: Insufficient documentation

## 2011-12-11 DIAGNOSIS — I2510 Atherosclerotic heart disease of native coronary artery without angina pectoris: Secondary | ICD-10-CM

## 2011-12-11 DIAGNOSIS — R0609 Other forms of dyspnea: Secondary | ICD-10-CM | POA: Insufficient documentation

## 2011-12-12 ENCOUNTER — Telehealth: Payer: Self-pay | Admitting: Cardiovascular Disease

## 2011-12-12 NOTE — Telephone Encounter (Signed)
FU Call: Pt returning call from our office regarding results of pt ECHO. Please return pt call to discuss further.  

## 2011-12-12 NOTE — Telephone Encounter (Signed)
Spoke with pt and reviewed echo results.  

## 2012-01-22 ENCOUNTER — Other Ambulatory Visit (HOSPITAL_COMMUNITY): Payer: Self-pay | Admitting: Orthopaedic Surgery

## 2012-01-23 ENCOUNTER — Encounter (HOSPITAL_COMMUNITY): Payer: Self-pay | Admitting: Pharmacy Technician

## 2012-01-29 ENCOUNTER — Ambulatory Visit (HOSPITAL_COMMUNITY)
Admission: RE | Admit: 2012-01-29 | Discharge: 2012-01-29 | Disposition: A | Discharge status: Home / Self Care | Payer: Medicaid Other | Source: Ambulatory Visit | Attending: Anesthesiology | Admitting: Anesthesiology

## 2012-01-29 ENCOUNTER — Encounter (HOSPITAL_COMMUNITY): Payer: Self-pay

## 2012-01-29 ENCOUNTER — Encounter (HOSPITAL_COMMUNITY)
Admission: RE | Admit: 2012-01-29 | Discharge: 2012-01-29 | Disposition: A | Discharge status: Home / Self Care | Payer: Medicaid Other | Source: Ambulatory Visit | Attending: Orthopaedic Surgery | Admitting: Orthopaedic Surgery

## 2012-01-29 DIAGNOSIS — I10 Essential (primary) hypertension: Secondary | ICD-10-CM | POA: Insufficient documentation

## 2012-01-29 DIAGNOSIS — I2510 Atherosclerotic heart disease of native coronary artery without angina pectoris: Secondary | ICD-10-CM | POA: Insufficient documentation

## 2012-01-29 DIAGNOSIS — I700 Atherosclerosis of aorta: Secondary | ICD-10-CM | POA: Insufficient documentation

## 2012-01-29 DIAGNOSIS — Z01812 Encounter for preprocedural laboratory examination: Secondary | ICD-10-CM | POA: Insufficient documentation

## 2012-01-29 DIAGNOSIS — Z01818 Encounter for other preprocedural examination: Secondary | ICD-10-CM | POA: Insufficient documentation

## 2012-01-29 HISTORY — DX: Unspecified osteoarthritis, unspecified site: M19.90

## 2012-01-29 HISTORY — DX: Sleep apnea, unspecified: G47.30

## 2012-01-29 LAB — URINALYSIS, ROUTINE W REFLEX MICROSCOPIC
Bilirubin Urine: NEGATIVE
Hgb urine dipstick: NEGATIVE
Ketones, ur: NEGATIVE mg/dL
Nitrite: NEGATIVE
Urobilinogen, UA: 1 mg/dL (ref 0.0–1.0)

## 2012-01-29 LAB — URINE MICROSCOPIC-ADD ON

## 2012-01-29 LAB — TYPE AND SCREEN
ABO/RH(D): A POS
Antibody Screen: NEGATIVE

## 2012-01-29 LAB — CBC
HCT: 37 % (ref 36.0–46.0)
MCH: 27.5 pg (ref 26.0–34.0)
MCV: 83.3 fL (ref 78.0–100.0)
Platelets: 232 10*3/uL (ref 150–400)
RDW: 13.6 % (ref 11.5–15.5)

## 2012-01-29 LAB — ABO/RH: ABO/RH(D): A POS

## 2012-01-29 LAB — BASIC METABOLIC PANEL
BUN: 19 mg/dL (ref 6–23)
CO2: 27 mEq/L (ref 19–32)
Calcium: 9.5 mg/dL (ref 8.4–10.5)
Creatinine, Ser: 1.07 mg/dL (ref 0.50–1.10)
Glucose, Bld: 107 mg/dL — ABNORMAL HIGH (ref 70–99)

## 2012-01-29 LAB — HCG, SERUM, QUALITATIVE: Preg, Serum: NEGATIVE

## 2012-01-29 IMAGING — CR DG CHEST 2V
2 series · 2 of 2 positions shown · non-contrast
Comparison: Chest x-ray [DATE].

CLINICAL DATA: Preop for right total knee replacement.  History of
hypertension and coronary artery disease.

CHEST - 2 VIEW

[view not recorded (1 of 2)]
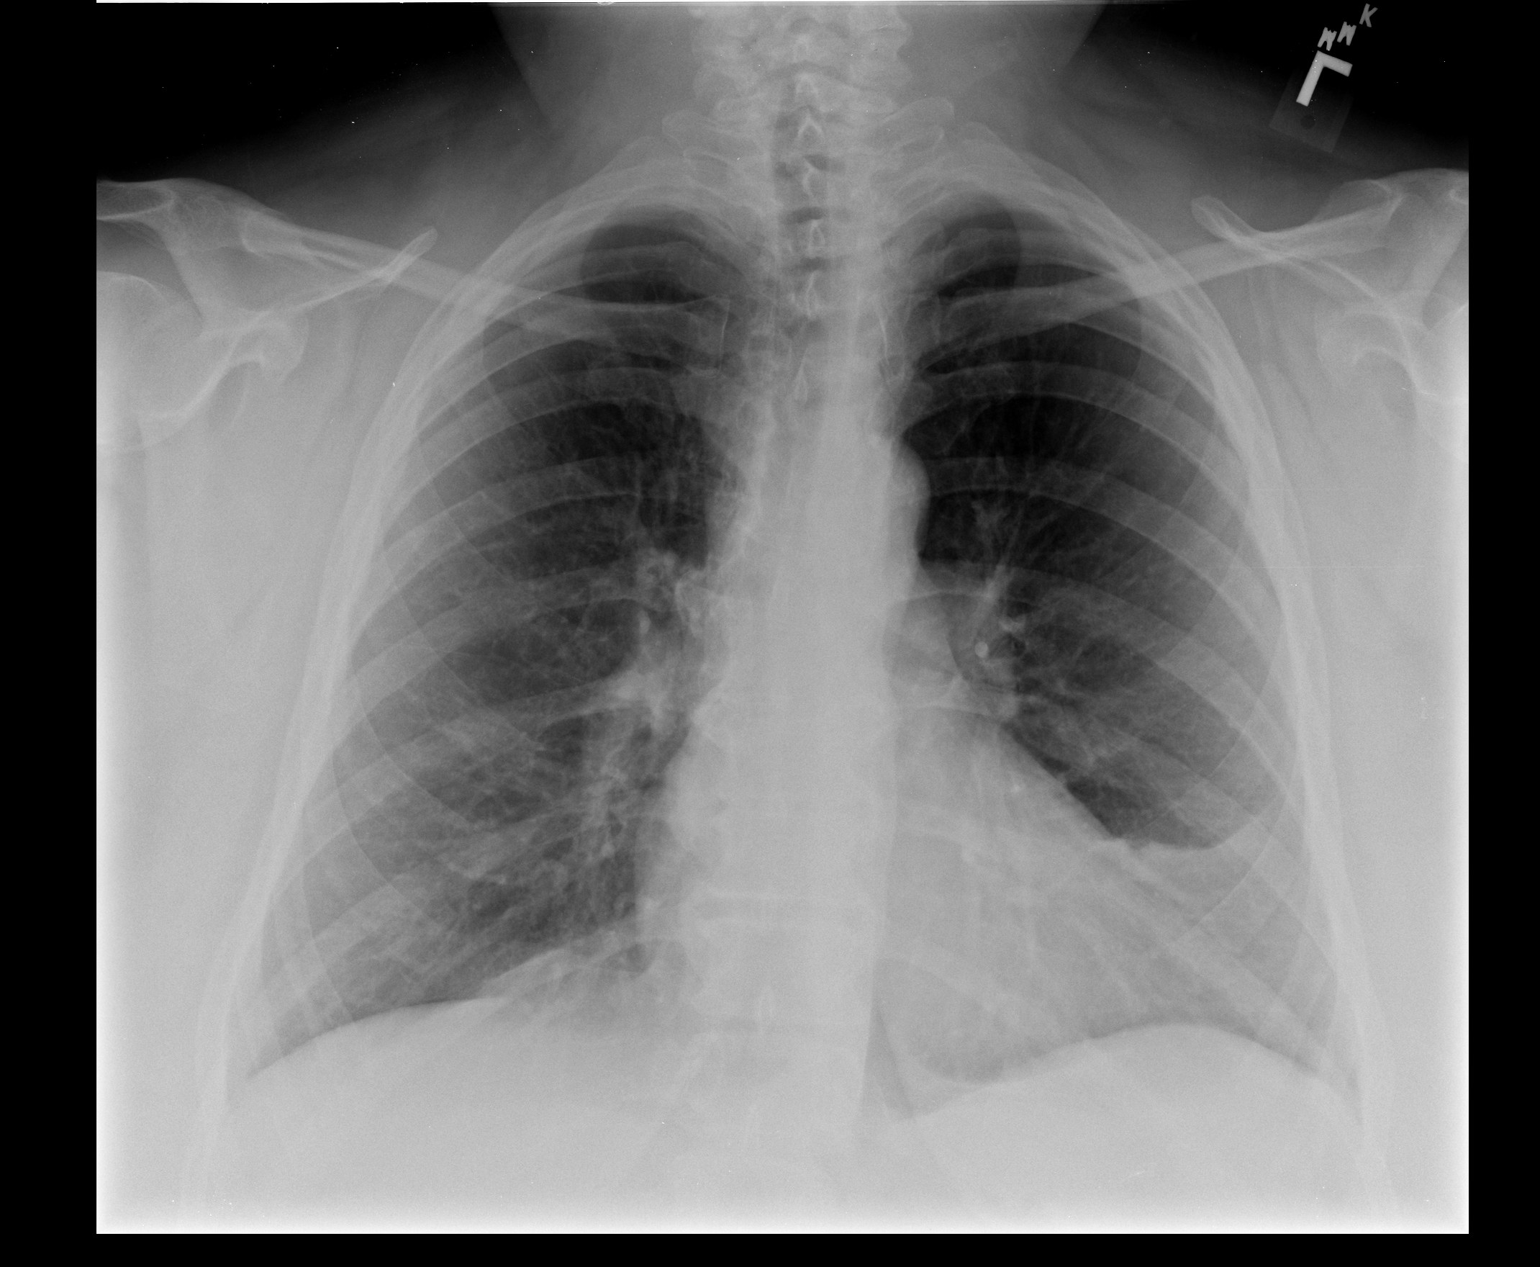

[view not recorded (2 of 2)]
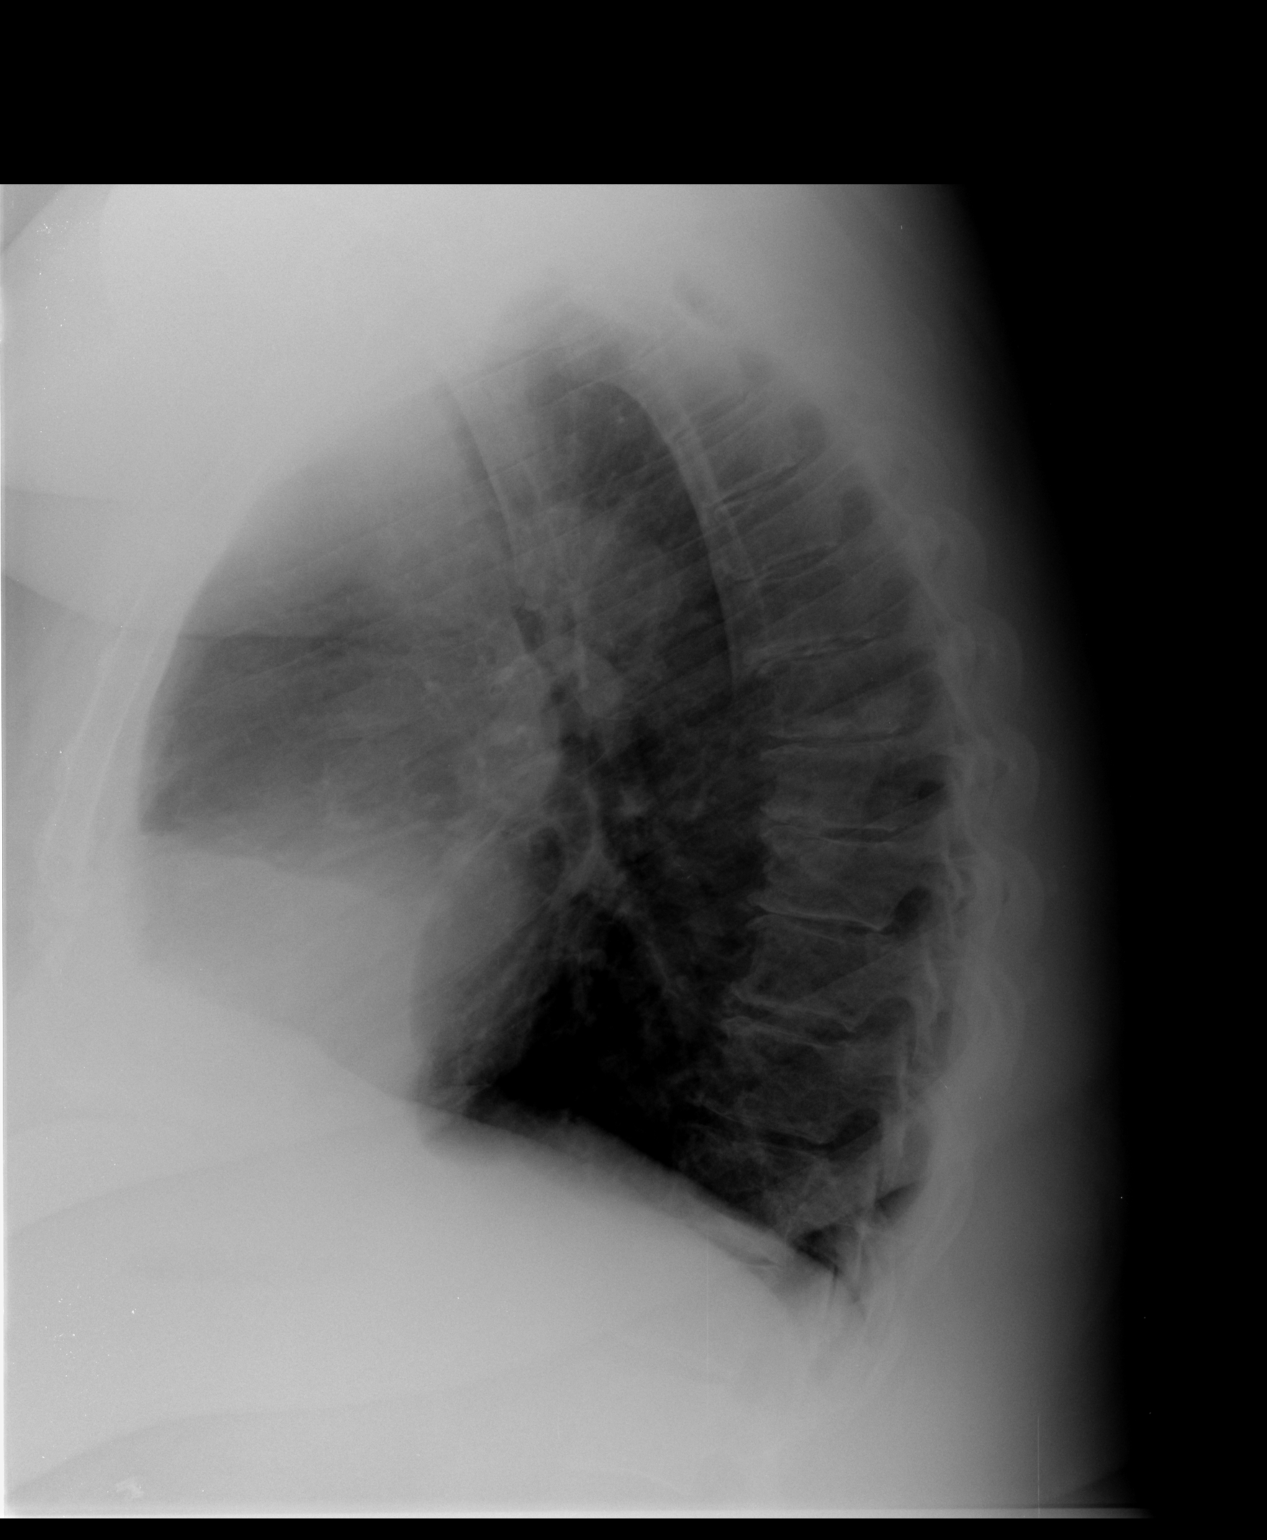

[2 of 2 positions shown; findings below may reference images not displayed]

FINDINGS: Lung volumes are normal.  No consolidative airspace
disease.  No pleural effusions.  No pneumothorax.  No pulmonary
nodule or mass noted.  Pulmonary vasculature and the
cardiomediastinal silhouette are within normal limits.
Atherosclerotic calcifications in the arch of the aorta.  Prominent
pericardial fat pad again noted.
IMPRESSION: 1. No radiographic evidence of acute cardiopulmonary disease.
2.  Atherosclerosis.

## 2012-01-29 NOTE — Pre-Procedure Instructions (Signed)
18 [name]  01/29/2012   Your procedure is scheduled on:  FEB 5 Report to [facility] at 1030 AM.  Call this number if you have problems the morning of surgery: [phone]   Remember:   Do not eat food:After Midnight.  May have clear liquids: up to 4 Hours before arrival.  Clear liquids include soda, tea, black coffee, apple or grape juice, broth.  Take these medicines the morning of surgery with A SIP OF WATER:LEXAPRO,NEURONTIN,ADVAIR    Do not wear jewelry, make-up or nail polish.  Do not wear lotions, powders, or perfumes. You may wear deodorant.  Do not shave 48 hours prior to surgery.  Do not bring valuables to the hospital.  Contacts, dentures or bridgework may not be worn into surgery.  Leave suitcase in the car. After surgery it may be brought to your room.  For patients admitted to the hospital, checkout time is 11:00 AM the day of discharge.   Patients discharged the day of surgery will not be allowed to drive home.  Name and phone number of your driver: FAMILY  Special Instructions: CHG Shower Use Special Wash: 1/2 bottle night before surgery and 1/2 bottle morning of surgery.   Please read over the following fact sheets that you were given: Coughing and Deep Breathing, Blood Transfusion Information, Total Joint Packet, MRSA Information and Surgical Site Infection Prevention

## 2012-01-30 ENCOUNTER — Encounter (HOSPITAL_COMMUNITY): Payer: Self-pay | Admitting: Vascular Surgery

## 2012-01-30 NOTE — Consult Note (Signed)
Anesthesia:  Patient is a 51 year old female scheduled for right TKA on 02/03/12.  History includes asthma, former smoker, CAD, HLD, GERD, depression, DM2, OSA, abnormal uterine bleeding.  She had a DES placed to the distal RCA by Dr. Elsie Lincoln in 01/2009 for single vessel CAD.  In December 2012, she established care with [facility] Cardiologist, Dr. Clifton James.  PCP is Dr. Della Goo.  Dr. Clifton James just saw her on 12/08/11.  EKG then showed NSR, low voltage.  She denied CP at that time, but felt she was slightly more SOB. He ordered an echo which was done on 12/11/11 and showed: - Left ventricle: The cavity size was normal. Wall thickness was increased in a pattern of mild LVH. Systolic function was normal. The estimated ejection fraction was in the range of 55% to 60%. - Aortic valve: Moderately calcified annulus. There was moderate stenosis. (VTI ratio of LVOT to aortic valve: 0.35. Indexed valve area: 0.5cm^2/m^2 (VTI).Peak velocity ratio of LVOT to aortic valve: 0.33. Indexed valve area: 0.47cm^2/m^2 (Vmax). Mean gradient: 33mm Hg (S). Peak gradient: 51mm Hg (S).) - Left atrium: The atrium was mildly dilated. - Atrial septum: No defect or patent foramen ovale was identified. Repeat echo in one year was recommended.  CXR from 01/29/12 showed 1. No radiographic evidence of acute cardiopulmonary disease. 2. Atherosclerosis.  Labs noted.  PT/PTT were not done at PAT, so will need to get on the day of surgery.  She does have moderate AS and history of a RCA stent, but with recent Cardiology evaluation.  I reviewed this history with Anesthesiologist Dr. Chaney Malling, who agrees that unless the patient has new or worsening cardiopulmonary symptoms then she should be okay to proceed.

## 2012-01-30 NOTE — Anesthesia Preprocedure Evaluation (Addendum)
Anesthesia Evaluation  Patient identified by MRN, date of birth, ID band Patient awake    Reviewed: Allergy & Precautions, H&P , NPO status , Patient's Chart, lab work & pertinent test results  History of Anesthesia Complications Negative for: history of anesthetic complications  Airway Mallampati: II  Neck ROM: Full    Dental  (+) Teeth Intact   Pulmonary shortness of breath, asthma , sleep apnea ,  clear to auscultation        Cardiovascular hypertension, + CAD Regular Normal+ Systolic murmurs Moderate AS (echo 11/2011)   Neuro/Psych    GI/Hepatic GERD-  ,  Endo/Other  Diabetes mellitus-Morbid obesity  Renal/GU      Musculoskeletal   Abdominal (+) obese, Morbidly obese c large pannicluls  Peds  Hematology   Anesthesia Other Findings   Reproductive/Obstetrics                         Anesthesia Physical Anesthesia Plan  ASA: IV  Anesthesia Plan: General   Post-op Pain Management:    Induction: Intravenous  Airway Management Planned: Oral ETT  Additional Equipment:   Intra-op Plan:   Post-operative Plan: Extubation in OR  Informed Consent: I have reviewed the patients History and Physical, chart, labs and discussed the procedure including the risks, benefits and alternatives for the proposed anesthesia with the patient or authorized representative who has indicated his/her understanding and acceptance.     Plan Discussed with: CRNA and Surgeon  Anesthesia Plan Comments:         Anesthesia Quick Evaluation

## 2012-02-02 MED ORDER — CEFAZOLIN SODIUM-DEXTROSE 2-3 GM-% IV SOLR
2.0000 g | INTRAVENOUS | Status: AC
  Administered 2012-02-03: 2 g via INTRAVENOUS
  Filled 2012-02-02: qty 50

## 2012-02-03 ENCOUNTER — Encounter (HOSPITAL_COMMUNITY): Payer: Self-pay | Admitting: *Deleted

## 2012-02-03 ENCOUNTER — Ambulatory Visit (HOSPITAL_COMMUNITY): Payer: Medicaid Other | Admitting: Vascular Surgery

## 2012-02-03 ENCOUNTER — Encounter (HOSPITAL_COMMUNITY): Payer: Self-pay | Admitting: Vascular Surgery

## 2012-02-03 ENCOUNTER — Inpatient Hospital Stay (HOSPITAL_COMMUNITY)
Admission: RE | Admit: 2012-02-03 | Discharge: 2012-02-06 | DRG: 470 | Disposition: A | Discharge status: Home / Self Care | Payer: Medicaid Other | Source: Ambulatory Visit | Attending: Orthopaedic Surgery | Admitting: Orthopaedic Surgery

## 2012-02-03 ENCOUNTER — Ambulatory Visit (HOSPITAL_COMMUNITY): Payer: Medicaid Other

## 2012-02-03 ENCOUNTER — Encounter (HOSPITAL_COMMUNITY)
Admission: RE | Disposition: A | Discharge status: Home / Self Care | Payer: Self-pay | Source: Ambulatory Visit | Attending: Orthopaedic Surgery

## 2012-02-03 DIAGNOSIS — Z886 Allergy status to analgesic agent status: Secondary | ICD-10-CM

## 2012-02-03 DIAGNOSIS — E119 Type 2 diabetes mellitus without complications: Secondary | ICD-10-CM | POA: Diagnosis present

## 2012-02-03 DIAGNOSIS — Z23 Encounter for immunization: Secondary | ICD-10-CM

## 2012-02-03 DIAGNOSIS — J45909 Unspecified asthma, uncomplicated: Secondary | ICD-10-CM | POA: Diagnosis present

## 2012-02-03 DIAGNOSIS — Z7902 Long term (current) use of antithrombotics/antiplatelets: Secondary | ICD-10-CM

## 2012-02-03 DIAGNOSIS — M1710 Unilateral primary osteoarthritis, unspecified knee: Principal | ICD-10-CM | POA: Diagnosis present

## 2012-02-03 DIAGNOSIS — E785 Hyperlipidemia, unspecified: Secondary | ICD-10-CM | POA: Diagnosis present

## 2012-02-03 DIAGNOSIS — K219 Gastro-esophageal reflux disease without esophagitis: Secondary | ICD-10-CM | POA: Diagnosis present

## 2012-02-03 DIAGNOSIS — I2510 Atherosclerotic heart disease of native coronary artery without angina pectoris: Secondary | ICD-10-CM | POA: Diagnosis present

## 2012-02-03 DIAGNOSIS — Z79899 Other long term (current) drug therapy: Secondary | ICD-10-CM

## 2012-02-03 DIAGNOSIS — I359 Nonrheumatic aortic valve disorder, unspecified: Secondary | ICD-10-CM | POA: Diagnosis present

## 2012-02-03 DIAGNOSIS — M1711 Unilateral primary osteoarthritis, right knee: Secondary | ICD-10-CM

## 2012-02-03 DIAGNOSIS — Z9861 Coronary angioplasty status: Secondary | ICD-10-CM

## 2012-02-03 DIAGNOSIS — F329 Major depressive disorder, single episode, unspecified: Secondary | ICD-10-CM | POA: Diagnosis present

## 2012-02-03 DIAGNOSIS — F3289 Other specified depressive episodes: Secondary | ICD-10-CM | POA: Diagnosis present

## 2012-02-03 DIAGNOSIS — I10 Essential (primary) hypertension: Secondary | ICD-10-CM | POA: Diagnosis present

## 2012-02-03 HISTORY — PX: KNEE ARTHROPLASTY: SHX992

## 2012-02-03 LAB — GLUCOSE, CAPILLARY: Glucose-Capillary: 134 mg/dL — ABNORMAL HIGH (ref 70–99)

## 2012-02-03 IMAGING — CR DG KNEE 1-2V PORT*R*
2 series · 2 of 2 positions shown · non-contrast
Comparison: Portable exam [R7] hours compared to [DATE]

CLINICAL DATA: Post right knee arthroplasty

PORTABLE RIGHT KNEE - 1-2 VIEW

[ap/obl knee]
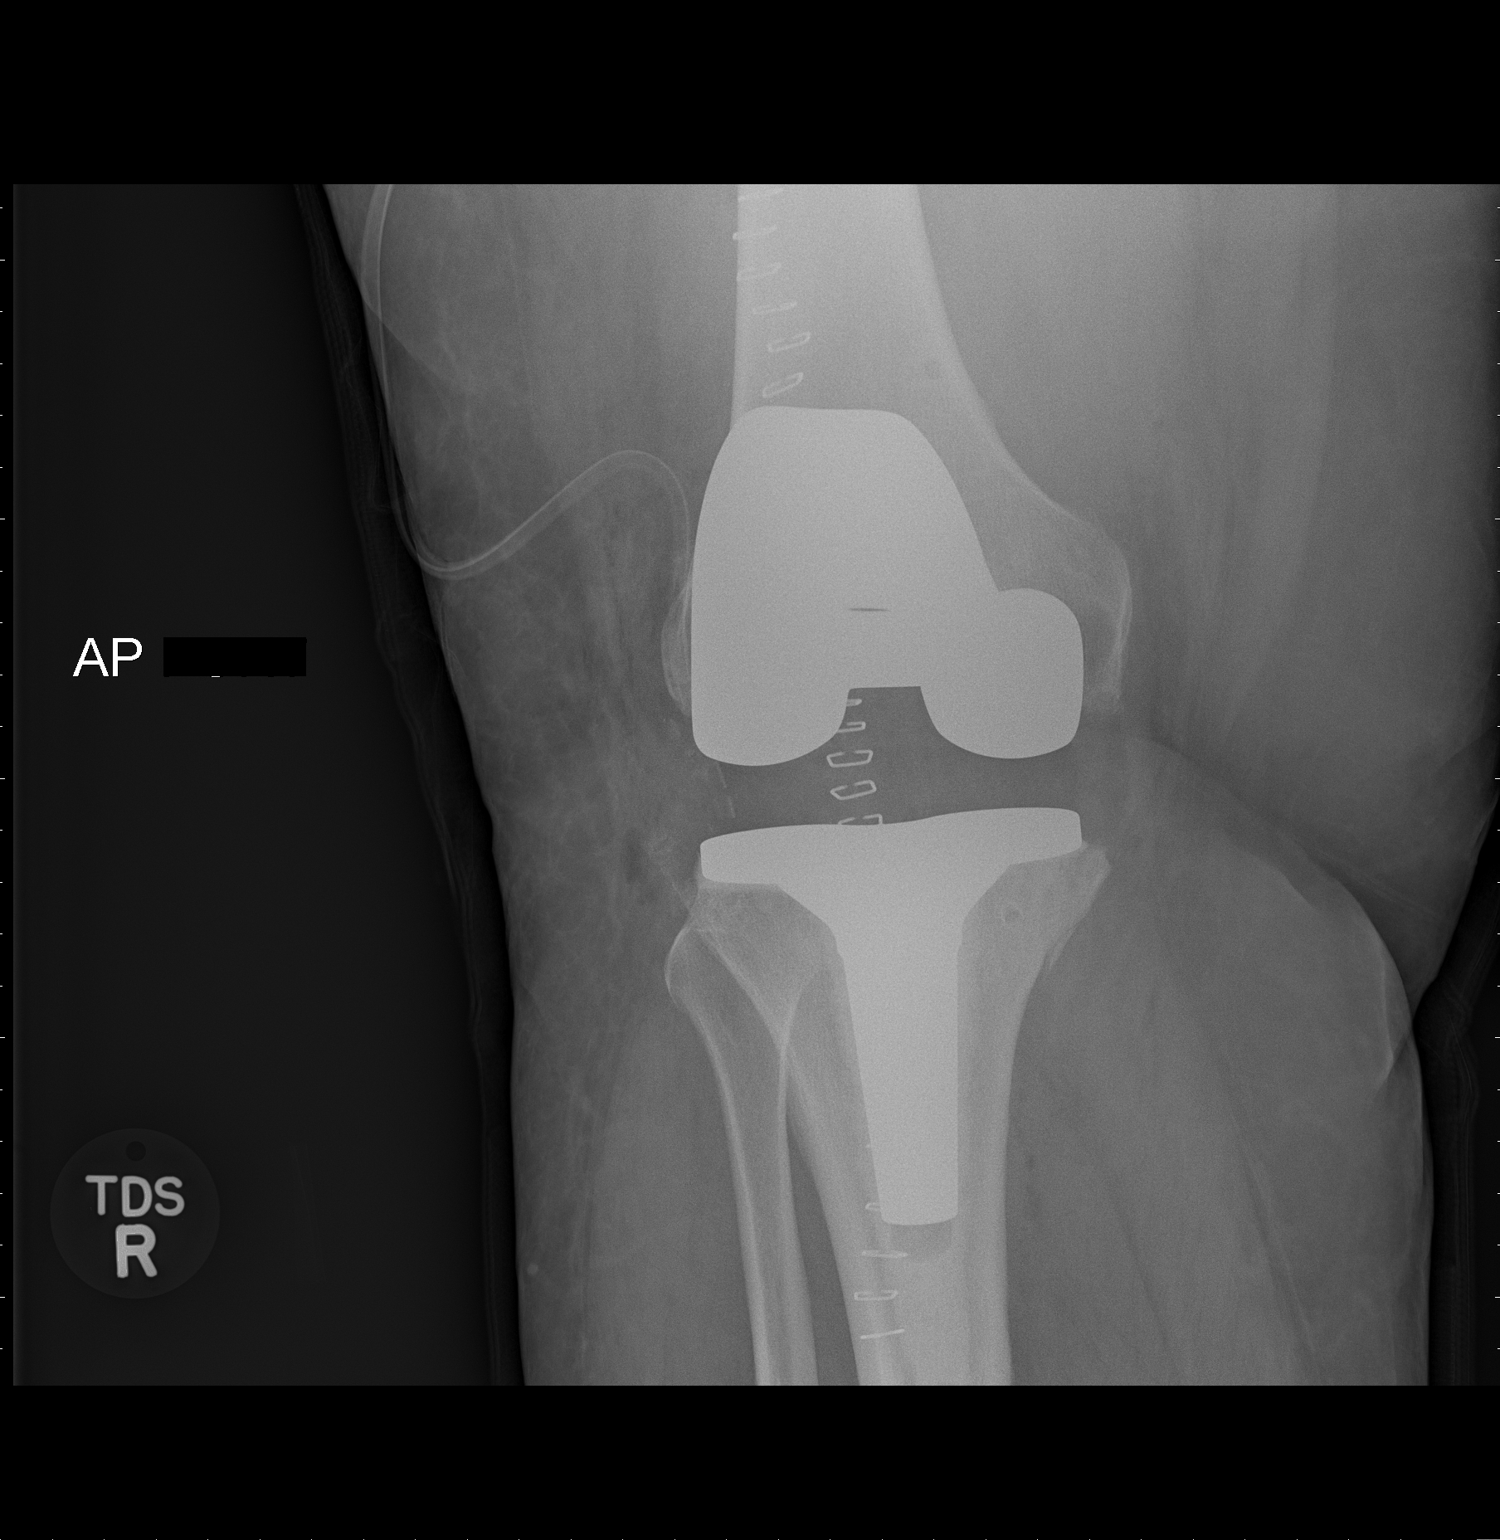

[knee lat]
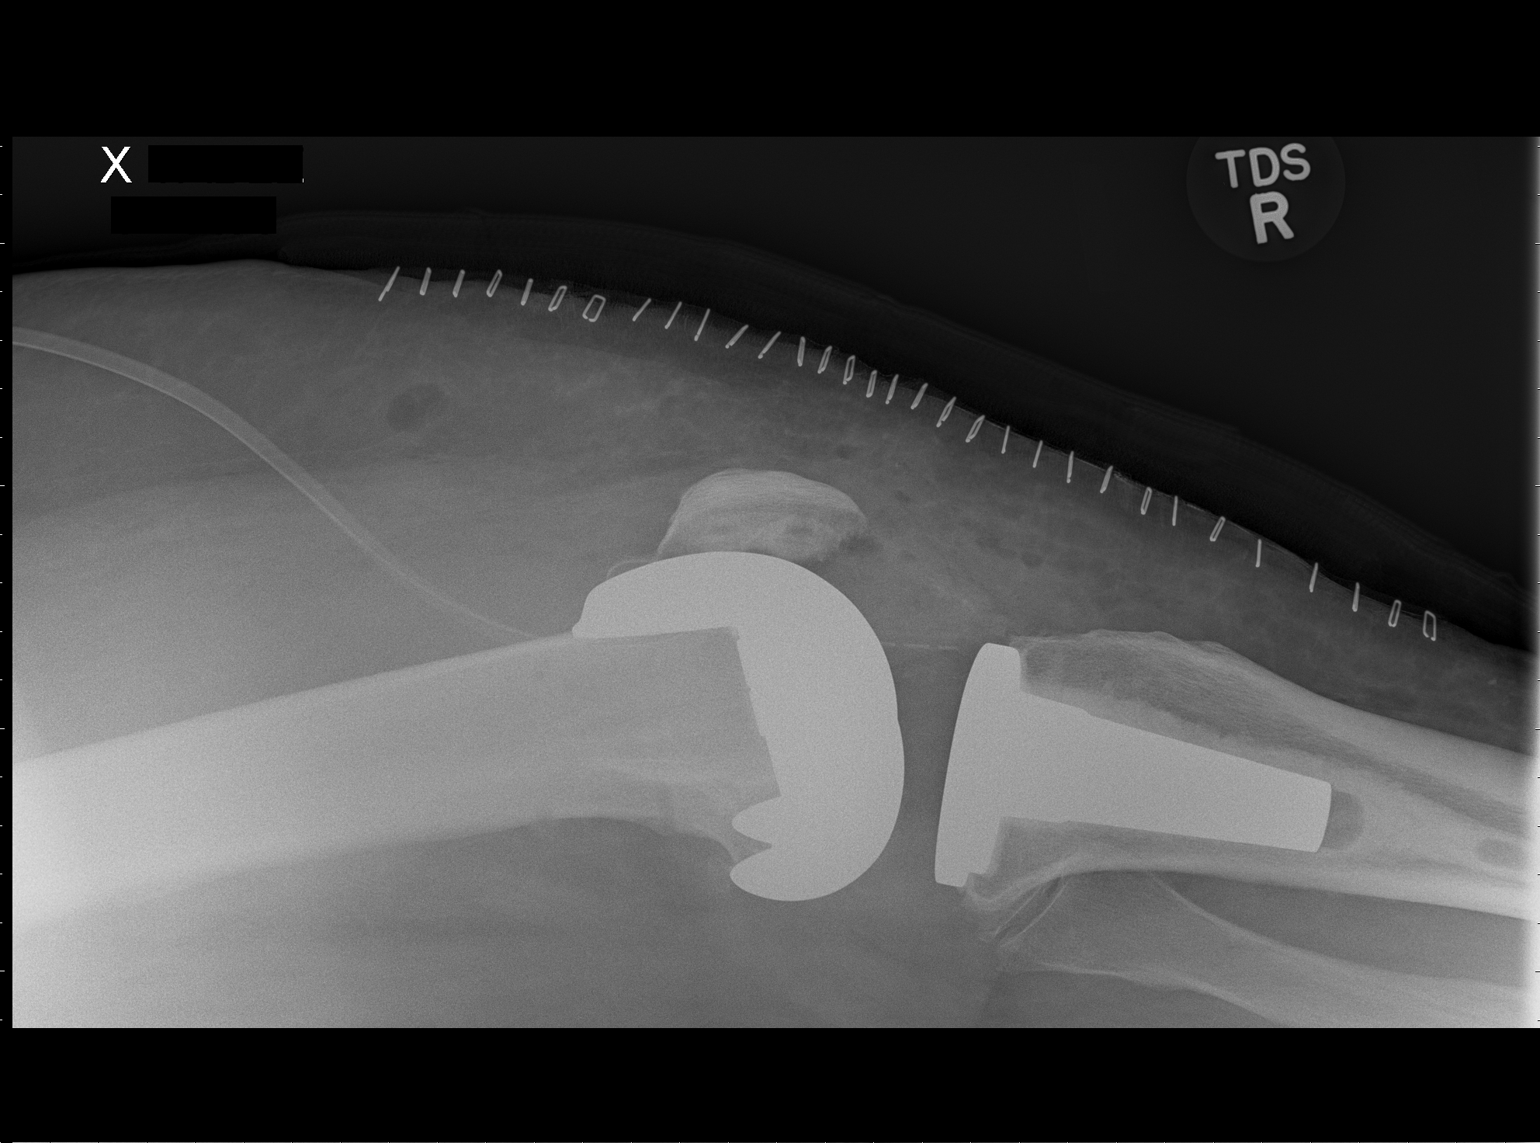

[2 of 2 positions shown; findings below may reference images not displayed]

FINDINGS: Components of right knee prosthesis in expected positions.
Scattered soft tissue swelling.
Surgical drain and skin clips.
No dislocation seen.
Tiny bone fragment identified at the medial margin of the proximal
tibia compatible with tiny fracture fragment.
No definite fracture plane is seen traversing the tibial
metaphysis.
Uncertain whether this traverses to the medial margin of the tibia,
medial to the tibial plateau component.
IMPRESSION: Components of right knee prosthesis in expected position.
Small bone fragment identified at medial margin of proximal medial
metaphysis question tiny avulsion fracture though cannot exclude
extension to the medial margin of the proximal tibia.

## 2012-02-03 SURGERY — ARTHROPLASTY, KNEE, TOTAL, USING IMAGELESS COMPUTER-ASSISTED NAVIGATION
Anesthesia: General | Site: Knee | Laterality: Right | Wound class: Clean

## 2012-02-03 MED ORDER — PHENOL 1.4 % MT LIQD
1.0000 | OROMUCOSAL | Status: DC | PRN
  Filled 2012-02-03: qty 177

## 2012-02-03 MED ORDER — ONDANSETRON HCL 4 MG/2ML IJ SOLN
INTRAMUSCULAR | Status: AC
  Filled 2012-02-03: qty 2

## 2012-02-03 MED ORDER — MENTHOL 3 MG MT LOZG: 1.0000 | LOZENGE | OROMUCOSAL | Status: DC | PRN

## 2012-02-03 MED ORDER — PROMETHAZINE HCL 25 MG/ML IJ SOLN: 6.2500 mg | INTRAMUSCULAR | Status: DC | PRN

## 2012-02-03 MED ORDER — SODIUM CHLORIDE 0.9 % IR SOLN
Status: DC | PRN
  Administered 2012-02-03: 3000 mL
  Administered 2012-02-03: 1000 mL

## 2012-02-03 MED ORDER — LOSARTAN POTASSIUM 50 MG PO TABS
100.0000 mg | ORAL_TABLET | Freq: Every day | ORAL | Status: DC
  Administered 2012-02-03 – 2012-02-06 (×4): 100 mg via ORAL
  Filled 2012-02-03 (×4): qty 2

## 2012-02-03 MED ORDER — ACETAMINOPHEN 650 MG RE SUPP: 650.0000 mg | Freq: Four times a day (QID) | RECTAL | Status: DC | PRN

## 2012-02-03 MED ORDER — LACTATED RINGERS IV SOLN
INTRAVENOUS | Status: DC
  Administered 2012-02-03: 12:00:00 via INTRAVENOUS

## 2012-02-03 MED ORDER — ROCURONIUM BROMIDE 100 MG/10ML IV SOLN
INTRAVENOUS | Status: DC | PRN
  Administered 2012-02-03: 40 mg via INTRAVENOUS
  Administered 2012-02-03: 10 mg via INTRAVENOUS

## 2012-02-03 MED ORDER — GLYCOPYRROLATE 0.2 MG/ML IJ SOLN
INTRAMUSCULAR | Status: DC | PRN
  Administered 2012-02-03: .6 mg via INTRAVENOUS
  Administered 2012-02-03: 0.2 mg via INTRAVENOUS

## 2012-02-03 MED ORDER — DOCUSATE SODIUM 100 MG PO CAPS
100.0000 mg | ORAL_CAPSULE | Freq: Two times a day (BID) | ORAL | Status: DC
  Administered 2012-02-03 – 2012-02-06 (×6): 100 mg via ORAL
  Filled 2012-02-03 (×8): qty 1

## 2012-02-03 MED ORDER — SUCCINYLCHOLINE CHLORIDE 20 MG/ML IJ SOLN
INTRAMUSCULAR | Status: DC | PRN
  Administered 2012-02-03: 120 mg via INTRAVENOUS

## 2012-02-03 MED ORDER — HYDROCHLOROTHIAZIDE 25 MG PO TABS
25.0000 mg | ORAL_TABLET | Freq: Every day | ORAL | Status: DC
  Administered 2012-02-03 – 2012-02-06 (×4): 25 mg via ORAL
  Filled 2012-02-03 (×4): qty 1

## 2012-02-03 MED ORDER — CLOPIDOGREL BISULFATE 75 MG PO TABS
75.0000 mg | ORAL_TABLET | Freq: Every day | ORAL | Status: DC
  Administered 2012-02-03 – 2012-02-06 (×4): 75 mg via ORAL
  Filled 2012-02-03 (×4): qty 1

## 2012-02-03 MED ORDER — MEPERIDINE HCL 25 MG/ML IJ SOLN: 6.2500 mg | INTRAMUSCULAR | Status: DC | PRN

## 2012-02-03 MED ORDER — MORPHINE SULFATE 2 MG/ML IJ SOLN
1.0000 mg | INTRAMUSCULAR | Status: DC | PRN
  Administered 2012-02-03 (×2): 2 mg via INTRAVENOUS

## 2012-02-03 MED ORDER — SODIUM CHLORIDE 0.9 % IJ SOLN: 9.0000 mL | INTRAMUSCULAR | Status: DC | PRN

## 2012-02-03 MED ORDER — ONDANSETRON HCL 4 MG/2ML IJ SOLN
INTRAMUSCULAR | Status: DC | PRN
  Administered 2012-02-03: 4 mg via INTRAVENOUS

## 2012-02-03 MED ORDER — LACTATED RINGERS IV SOLN
INTRAVENOUS | Status: DC | PRN
  Administered 2012-02-03 (×3): via INTRAVENOUS

## 2012-02-03 MED ORDER — ONDANSETRON HCL 4 MG/2ML IJ SOLN: 4.0000 mg | Freq: Four times a day (QID) | INTRAMUSCULAR | Status: DC | PRN

## 2012-02-03 MED ORDER — DIPHENHYDRAMINE HCL 50 MG/ML IJ SOLN: 12.5000 mg | Freq: Four times a day (QID) | INTRAMUSCULAR | Status: DC | PRN

## 2012-02-03 MED ORDER — DEXTROSE 5 % IV SOLN: 500.0000 mg | INTRAVENOUS | Status: DC

## 2012-02-03 MED ORDER — SIMVASTATIN 20 MG PO TABS
20.0000 mg | ORAL_TABLET | Freq: Every day | ORAL | Status: DC
  Administered 2012-02-03 – 2012-02-06 (×4): 20 mg via ORAL
  Filled 2012-02-03 (×4): qty 1

## 2012-02-03 MED ORDER — MIDAZOLAM HCL 5 MG/5ML IJ SOLN
INTRAMUSCULAR | Status: DC | PRN
  Administered 2012-02-03: 2 mg via INTRAVENOUS

## 2012-02-03 MED ORDER — ZOLPIDEM TARTRATE 5 MG PO TABS: 5.0000 mg | ORAL_TABLET | Freq: Every evening | ORAL | Status: DC | PRN

## 2012-02-03 MED ORDER — PROPOFOL 10 MG/ML IV EMUL
INTRAVENOUS | Status: DC | PRN
  Administered 2012-02-03: 200 mg via INTRAVENOUS
  Administered 2012-02-03: 30 mg via INTRAVENOUS

## 2012-02-03 MED ORDER — MORPHINE SULFATE (PF) 1 MG/ML IV SOLN
INTRAVENOUS | Status: DC
  Administered 2012-02-03 (×2): via INTRAVENOUS
  Administered 2012-02-03: 15 mg via INTRAVENOUS
  Administered 2012-02-04: 8 mg via INTRAVENOUS
  Administered 2012-02-04: 0.3 mg via INTRAVENOUS
  Administered 2012-02-04: 9 mg via INTRAVENOUS
  Administered 2012-02-04: 16:00:00 via INTRAVENOUS
  Administered 2012-02-04: 12 mg via INTRAVENOUS
  Filled 2012-02-03 (×3): qty 25

## 2012-02-03 MED ORDER — NEOSTIGMINE METHYLSULFATE 1 MG/ML IJ SOLN
INTRAMUSCULAR | Status: DC | PRN
  Administered 2012-02-03: 4 mg via INTRAVENOUS

## 2012-02-03 MED ORDER — ACETAMINOPHEN 325 MG PO TABS
650.0000 mg | ORAL_TABLET | Freq: Four times a day (QID) | ORAL | Status: DC | PRN
  Administered 2012-02-04 – 2012-02-06 (×2): 650 mg via ORAL
  Filled 2012-02-03 (×2): qty 2

## 2012-02-03 MED ORDER — ESCITALOPRAM OXALATE 20 MG PO TABS
20.0000 mg | ORAL_TABLET | Freq: Every day | ORAL | Status: DC
  Administered 2012-02-03 – 2012-02-06 (×4): 20 mg via ORAL
  Filled 2012-02-03 (×4): qty 1

## 2012-02-03 MED ORDER — FLUTICASONE-SALMETEROL 250-50 MCG/DOSE IN AEPB
1.0000 | INHALATION_SPRAY | Freq: Two times a day (BID) | RESPIRATORY_TRACT | Status: DC
  Administered 2012-02-03 – 2012-02-06 (×6): 1 via RESPIRATORY_TRACT
  Filled 2012-02-03 (×2): qty 14

## 2012-02-03 MED ORDER — METHOCARBAMOL 100 MG/ML IJ SOLN
500.0000 mg | Freq: Four times a day (QID) | INTRAMUSCULAR | Status: DC | PRN
  Administered 2012-02-03: 500 mg via INTRAVENOUS
  Filled 2012-02-03 (×3): qty 5

## 2012-02-03 MED ORDER — HYDROCODONE-ACETAMINOPHEN 5-325 MG PO TABS
1.0000 | ORAL_TABLET | ORAL | Status: DC | PRN
  Filled 2012-02-03 (×2): qty 2

## 2012-02-03 MED ORDER — GLIPIZIDE 5 MG PO TABS
5.0000 mg | ORAL_TABLET | Freq: Two times a day (BID) | ORAL | Status: DC
  Administered 2012-02-04 – 2012-02-06 (×5): 5 mg via ORAL
  Filled 2012-02-03 (×7): qty 1

## 2012-02-03 MED ORDER — METHOCARBAMOL 500 MG PO TABS
500.0000 mg | ORAL_TABLET | Freq: Four times a day (QID) | ORAL | Status: DC | PRN
  Administered 2012-02-03 – 2012-02-04 (×3): 500 mg via ORAL
  Filled 2012-02-03 (×4): qty 1

## 2012-02-03 MED ORDER — ALUM & MAG HYDROXIDE-SIMETH 200-200-20 MG/5ML PO SUSP: 30.0000 mL | ORAL | Status: DC | PRN

## 2012-02-03 MED ORDER — METOCLOPRAMIDE HCL 10 MG PO TABS
5.0000 mg | ORAL_TABLET | Freq: Three times a day (TID) | ORAL | Status: DC | PRN
Start: 2012-02-03 — End: 2012-02-06

## 2012-02-03 MED ORDER — DIPHENHYDRAMINE HCL 12.5 MG/5ML PO ELIX
12.5000 mg | ORAL_SOLUTION | Freq: Four times a day (QID) | ORAL | Status: DC | PRN
  Filled 2012-02-03: qty 5

## 2012-02-03 MED ORDER — NALOXONE HCL 0.4 MG/ML IJ SOLN: 0.4000 mg | INTRAMUSCULAR | Status: DC | PRN

## 2012-02-03 MED ORDER — ONDANSETRON HCL 4 MG/2ML IJ SOLN
4.0000 mg | Freq: Four times a day (QID) | INTRAMUSCULAR | Status: DC | PRN
  Administered 2012-02-03: 4 mg via INTRAVENOUS

## 2012-02-03 MED ORDER — MORPHINE SULFATE 2 MG/ML IJ SOLN
INTRAMUSCULAR | Status: AC
  Filled 2012-02-03: qty 1

## 2012-02-03 MED ORDER — SODIUM CHLORIDE 0.9 % IV SOLN
INTRAVENOUS | Status: DC
  Administered 2012-02-03 – 2012-02-05 (×3): via INTRAVENOUS

## 2012-02-03 MED ORDER — ALBUTEROL SULFATE HFA 108 (90 BASE) MCG/ACT IN AERS
1.0000 | INHALATION_SPRAY | Freq: Four times a day (QID) | RESPIRATORY_TRACT | Status: DC | PRN
  Administered 2012-02-04: 1 via RESPIRATORY_TRACT
  Filled 2012-02-03: qty 6.7

## 2012-02-03 MED ORDER — CEFAZOLIN SODIUM 1-5 GM-% IV SOLN
1.0000 g | Freq: Four times a day (QID) | INTRAVENOUS | Status: AC
  Administered 2012-02-03 – 2012-02-04 (×3): 1 g via INTRAVENOUS
  Filled 2012-02-03 (×3): qty 50

## 2012-02-03 MED ORDER — METOCLOPRAMIDE HCL 5 MG/ML IJ SOLN
5.0000 mg | Freq: Three times a day (TID) | INTRAMUSCULAR | Status: DC | PRN
Start: 2012-02-03 — End: 2012-02-06
  Filled 2012-02-03: qty 2

## 2012-02-03 MED ORDER — ONDANSETRON HCL 4 MG PO TABS: 4.0000 mg | ORAL_TABLET | Freq: Four times a day (QID) | ORAL | Status: DC | PRN

## 2012-02-03 MED ORDER — MORPHINE SULFATE 2 MG/ML IJ SOLN
1.0000 mg | INTRAMUSCULAR | Status: DC | PRN
  Administered 2012-02-04: 1 mg via INTRAVENOUS
  Filled 2012-02-03 (×3): qty 1

## 2012-02-03 MED ORDER — OXYCODONE HCL 5 MG PO TABS
5.0000 mg | ORAL_TABLET | ORAL | Status: DC | PRN
  Administered 2012-02-04 (×2): 10 mg via ORAL
  Administered 2012-02-05 – 2012-02-06 (×7): 5 mg via ORAL
  Filled 2012-02-03 (×4): qty 1
  Filled 2012-02-03: qty 2
  Filled 2012-02-03 (×4): qty 1
  Filled 2012-02-03: qty 2

## 2012-02-03 MED ORDER — DIPHENHYDRAMINE HCL 12.5 MG/5ML PO ELIX
12.5000 mg | ORAL_SOLUTION | ORAL | Status: DC | PRN
  Filled 2012-02-03: qty 10

## 2012-02-03 MED ORDER — GABAPENTIN 300 MG PO CAPS
300.0000 mg | ORAL_CAPSULE | Freq: Every day | ORAL | Status: DC
  Administered 2012-02-03 – 2012-02-06 (×4): 300 mg via ORAL
  Filled 2012-02-03 (×4): qty 1

## 2012-02-03 MED ORDER — LABETALOL HCL 5 MG/ML IV SOLN
INTRAVENOUS | Status: DC | PRN
  Administered 2012-02-03: 10 mg via INTRAVENOUS
  Administered 2012-02-03 (×2): 5 mg via INTRAVENOUS

## 2012-02-03 MED ORDER — FENTANYL CITRATE 0.05 MG/ML IJ SOLN
INTRAMUSCULAR | Status: DC | PRN
  Administered 2012-02-03: 100 ug via INTRAVENOUS
  Administered 2012-02-03 (×2): 50 ug via INTRAVENOUS
  Administered 2012-02-03: 150 ug via INTRAVENOUS
  Administered 2012-02-03: 100 ug via INTRAVENOUS

## 2012-02-03 SURGICAL SUPPLY — 70 items
BANDAGE ELASTIC 6 VELCRO ST LF (GAUZE/BANDAGES/DRESSINGS) ×4 IMPLANT
BANDAGE ESMARK 6X9 LF (GAUZE/BANDAGES/DRESSINGS) ×1 IMPLANT
BLADE SAG 18X100X1.27 (BLADE) ×2 IMPLANT
BLADE SAGITTAL 25.0X1.19X90 (BLADE) ×2 IMPLANT
BLADE SAW SGTL 13.0X1.19X90.0M (BLADE) ×2 IMPLANT
BNDG ESMARK 6X9 LF (GAUZE/BANDAGES/DRESSINGS) ×2
BOWL SMART MIX CTS (DISPOSABLE) ×2 IMPLANT
CEMENT HV SMART SET (Cement) ×4 IMPLANT
CLOTH BEACON ORANGE TIMEOUT ST (SAFETY) ×2 IMPLANT
COVER BACK TABLE 24X17X13 BIG (DRAPES) IMPLANT
COVER SURGICAL LIGHT HANDLE (MISCELLANEOUS) ×2 IMPLANT
CUFF TOURNIQUET SINGLE 34IN LL (TOURNIQUET CUFF) IMPLANT
CUFF TOURNIQUET SINGLE 44IN (TOURNIQUET CUFF) ×2 IMPLANT
DRAPE INCISE IOBAN 66X45 STRL (DRAPES) ×2 IMPLANT
DRAPE ORTHO SPLIT 77X108 STRL (DRAPES) ×2
DRAPE PROXIMA HALF (DRAPES) ×2 IMPLANT
DRAPE SURG ORHT 6 SPLT 77X108 (DRAPES) ×2 IMPLANT
DRAPE U-SHAPE 47X51 STRL (DRAPES) ×2 IMPLANT
DRSG PAD ABDOMINAL 8X10 ST (GAUZE/BANDAGES/DRESSINGS) ×4 IMPLANT
DURAPREP 26ML APPLICATOR (WOUND CARE) ×2 IMPLANT
ELECT REM PT RETURN 9FT ADLT (ELECTROSURGICAL) ×2
ELECTRODE REM PT RTRN 9FT ADLT (ELECTROSURGICAL) ×1 IMPLANT
EVACUATOR 1/8 PVC DRAIN (DRAIN) ×2 IMPLANT
FACESHIELD LNG OPTICON STERILE (SAFETY) ×4 IMPLANT
GAUZE XEROFORM 5X9 LF (GAUZE/BANDAGES/DRESSINGS) ×2 IMPLANT
GLOVE BIO SURGEON STRL SZ7.5 (GLOVE) ×2 IMPLANT
GLOVE BIOGEL PI IND STRL 7.0 (GLOVE) ×1 IMPLANT
GLOVE BIOGEL PI IND STRL 8 (GLOVE) ×1 IMPLANT
GLOVE BIOGEL PI INDICATOR 7.0 (GLOVE) ×1
GLOVE BIOGEL PI INDICATOR 8 (GLOVE) ×1
GLOVE BIOGEL PI ORTHO PRO SZ7 (GLOVE) ×1
GLOVE ECLIPSE 7.0 STRL STRAW (GLOVE) ×2 IMPLANT
GLOVE ORTHO TXT STRL SZ7.5 (GLOVE) ×4 IMPLANT
GLOVE PI ORTHO PRO STRL SZ7 (GLOVE) ×1 IMPLANT
GLOVE SURG SS PI 6.5 STRL IVOR (GLOVE) ×4 IMPLANT
GLOVE SURG SS PI 7.0 STRL IVOR (GLOVE) ×2 IMPLANT
GOWN PREVENTION PLUS LG XLONG (DISPOSABLE) IMPLANT
GOWN PREVENTION PLUS XLARGE (GOWN DISPOSABLE) ×2 IMPLANT
GOWN STRL NON-REIN LRG LVL3 (GOWN DISPOSABLE) IMPLANT
GOWN STRL REIN XL XLG (GOWN DISPOSABLE) ×6 IMPLANT
HANDPIECE INTERPULSE COAX TIP (DISPOSABLE) ×1
IMMOBILIZER KNEE 22 UNIV (SOFTGOODS) ×2 IMPLANT
KIT BASIN OR (CUSTOM PROCEDURE TRAY) ×2 IMPLANT
KIT ROOM TURNOVER OR (KITS) ×2 IMPLANT
MANIFOLD NEPTUNE II (INSTRUMENTS) ×2 IMPLANT
MARKER SPHERE PSV REFLC THRD 5 (MARKER) ×6 IMPLANT
NEEDLE SPNL 18GX3.5 QUINCKE PK (NEEDLE) IMPLANT
NS IRRIG 1000ML POUR BTL (IV SOLUTION) ×2 IMPLANT
PACK TOTAL JOINT (CUSTOM PROCEDURE TRAY) ×2 IMPLANT
PAD ARMBOARD 7.5X6 YLW CONV (MISCELLANEOUS) ×2 IMPLANT
PADDING CAST COTTON 6X4 STRL (CAST SUPPLIES) IMPLANT
PIN SCHANZ 4MM 130MM (PIN) IMPLANT
SET HNDPC FAN SPRY TIP SCT (DISPOSABLE) ×1 IMPLANT
SPONGE GAUZE 4X4 12PLY (GAUZE/BANDAGES/DRESSINGS) ×2 IMPLANT
STAPLER VISISTAT 35W (STAPLE) ×2 IMPLANT
STRIP CLOSURE SKIN 1/2X4 (GAUZE/BANDAGES/DRESSINGS) IMPLANT
SUCTION FRAZIER TIP 10 FR DISP (SUCTIONS) ×2 IMPLANT
SUT ETHILON 3 0 PS 1 (SUTURE) ×2 IMPLANT
SUT VIC AB 0 CT1 27 (SUTURE) ×2
SUT VIC AB 0 CT1 27XBRD ANBCTR (SUTURE) ×2 IMPLANT
SUT VIC AB 1 CT1 27 (SUTURE) ×3
SUT VIC AB 1 CT1 27XBRD ANBCTR (SUTURE) ×3 IMPLANT
SUT VIC AB 2-0 CT1 27 (SUTURE) ×2
SUT VIC AB 2-0 CT1 TAPERPNT 27 (SUTURE) ×2 IMPLANT
SUT VIC AB 4-0 PS2 27 (SUTURE) IMPLANT
SWABSTICK BENZOIN STERILE (MISCELLANEOUS) IMPLANT
TOWEL OR 17X24 6PK STRL BLUE (TOWEL DISPOSABLE) ×2 IMPLANT
TOWEL OR 17X26 10 PK STRL BLUE (TOWEL DISPOSABLE) ×2 IMPLANT
TRAY FOLEY CATH 14FR (SET/KITS/TRAYS/PACK) ×2 IMPLANT
WATER STERILE IRR 1000ML POUR (IV SOLUTION) ×4 IMPLANT

## 2012-02-03 NOTE — H&P (Signed)
[name] is an 51 y.o. female.   Chief Complaint:   Severe right knee pain with known end-stage arthritis HPI:   51 yo female with severe right knee pain and x-rays showing bone-on-bone wear.  Her pain is debilitating and greatly effects her quality of life.  She wishes to proceed with a total knee replacement.  She understands fully the risks of infections, blood loss, nerve injury, PE and DVT.  She has multiple medical problems and realizes that these could lead to a poor outcome.  Our goal is decreased pain and improved function/mobility.  Past Medical History  Diagnosis Date  . Asthma   . Hypertension   . CAD (coronary artery disease)     Cath 2010 with DES x 1 RCA  . Hyperlipidemia   . GERD (gastroesophageal reflux disease)   . Depression   . Diabetes mellitus 2010  . Arthritis     knees  . Abnormal uterine bleeding     last menstrual period 3 yrs ago.to be evalauated for uterine polyp  . Sleep apnea     never had sleep study  . Aortic stenosis, moderate     per 11/2011 echo    Past Surgical History  Procedure Date  . Tubal ligation   . Carpal tunnel release     Bilateral  . Cholecystectomy   . Hernia repair   . Cardiac catheterization   . Coronary angioplasty     2010    Family History  Problem Relation Age of Onset  . Cancer Mother     Breast  . COPD Father   . Cancer Brother     Sinus   Social History:  reports that she has quit smoking. Her smoking use included Cigarettes. She has a 45 pack-year smoking history. She does not have any smokeless tobacco history on file. She reports that she does not drink alcohol or use illicit drugs.  Allergies:  Allergies  Allergen Reactions  . Hydrocodone     itching    Medications Prior to Admission  Medication Dose Route Frequency Provider Last Rate Last Dose  . ceFAZolin (ANCEF) IVPB 2 g/50 mL premix  2 g Intravenous 60 min Pre-Op [name], MD       Medications Prior to Admission    Medication Sig Dispense Refill  . albuterol (PROVENTIL HFA;VENTOLIN HFA) 108 (90 BASE) MCG/ACT inhaler Inhale 1 puff into the lungs every 6 (six) hours as needed. Shortness of breath.       Marland Kitchen atorvastatin (LIPITOR) 40 MG tablet Take 40 mg by mouth at bedtime.       . diclofenac (VOLTAREN) 25 MG EC tablet Take 25 mg by mouth 2 (two) times daily.        Marland Kitchen escitalopram (LEXAPRO) 20 MG tablet Take 20 mg by mouth daily.        . Fluticasone-Salmeterol (ADVAIR) 250-50 MCG/DOSE AEPB Inhale 1 puff into the lungs 2 (two) times daily.       Marland Kitchen gabapentin (NEURONTIN) 300 MG capsule Take 300 mg by mouth daily. Patient states she takes it once daily. Per patient.       Marland Kitchen glipiZIDE (GLUCOTROL) 5 MG tablet Take 5 mg by mouth 2 (two) times daily before a meal.        . clopidogrel (PLAVIX) 75 MG tablet Take 75 mg by mouth daily.          Results for orders placed during the hospital encounter of 02/03/12 (from the past  48 hour(s))  APTT     Status: Normal   Collection Time   02/03/12 10:36 AM      Component Value Range Comment   aPTT 28  24 - 37 (seconds)   PROTIME-INR     Status: Normal   Collection Time   02/03/12 10:36 AM      Component Value Range Comment   Prothrombin Time 12.8  11.6 - 15.2 (seconds)    INR 0.94  0.00 - 1.49     No results found.  Review of Systems  All other systems reviewed and are negative.    Blood pressure 145/83, pulse 93, temperature 98.4 F (36.9 C), temperature source Oral, resp. rate 20, last menstrual period 02/03/2012, SpO2 95.00%. Physical Exam  Constitutional: She is oriented to person, place, and time. She appears well-developed and well-nourished.  HENT:  Head: Normocephalic and atraumatic.  Eyes: Pupils are equal, round, and reactive to light.  Neck: Normal range of motion. Neck supple.  Cardiovascular: Normal rate and regular rhythm.   Respiratory: Effort normal and breath sounds normal.  GI: Soft. Bowel sounds are normal.  Musculoskeletal:       Right  knee: She exhibits decreased range of motion, effusion and bony tenderness. tenderness found. Medial joint line and lateral joint line tenderness noted.  Neurological: She is alert and oriented to person, place, and time.  Skin: Skin is warm and dry.  Psychiatric: She has a normal mood and affect.     Assessment/Plan To the OR today for a right total knee replacement then admission as an inpatient.  Tonianne Fine Y 02/03/2012, 11:57 AM

## 2012-02-03 NOTE — Preoperative (Signed)
Beta Blockers   Reason not to administer Beta Blockers:Not Applicable 

## 2012-02-03 NOTE — Progress Notes (Signed)
Orthopedic Tech Progress Note Patient Details:  [name] 23-Nov-1961 [id number]  CPM Right Knee CPM Right Knee: On Right Knee Flexion (Degrees): 0  Right Knee Extension (Degrees): 60  Additional Comments: (R) LE Applied trapeze bar patient helper  Jennye Moccasin 02/03/2012, 7:38 PM

## 2012-02-03 NOTE — Brief Op Note (Signed)
02/03/2012  4:23 PM  PATIENT:  [name]  51 y.o. female  PRE-OPERATIVE DIAGNOSIS:  severe right knee osteoarthritis  POST-OPERATIVE DIAGNOSIS:  severe right knee osteoarthritis  PROCEDURE:  Procedure(s): COMPUTER ASSISTED TOTAL KNEE ARTHROPLASTY  SURGEON:  Surgeon(s): [name], MD  PHYSICIAN ASSISTANT:   ASSISTANTS: April Greene, RNFA   ANESTHESIA:   general  EBL:  Total I/O In: 2000 [I.V.:2000] Out: 125 [Urine:125]  BLOOD ADMINISTERED:none  DRAINS: (medium) Hemovact drain(s) in the knee with  Suction Open   LOCAL MEDICATIONS USED:  NONE  SPECIMEN:  No Specimen  DISPOSITION OF SPECIMEN:  N/A  COUNTS:  YES  TOURNIQUET:  * Missing tourniquet times found for documented tourniquets in log:  20748 *  DICTATION: .Other Dictation: Dictation Number [id number]  PLAN OF CARE: Admit to inpatient   PATIENT DISPOSITION:  PACU - hemodynamically stable.   Delay start of Pharmacological VTE agent (>24hrs) due to surgical blood loss or risk of bleeding:  {YES/NO/NOT APPLICABLE:20182

## 2012-02-03 NOTE — Transfer of Care (Signed)
Immediate Anesthesia Transfer of Care Note  Patient: [name]  Procedure(s) Performed:  COMPUTER ASSISTED TOTAL KNEE ARTHROPLASTY - Right total knee arthroplasty  Patient Location: PACU  Anesthesia Type: General  Level of Consciousness: awake, sedated, patient cooperative and responds to stimulation  Airway & Oxygen Therapy: Patient Spontanous Breathing and Patient connected to nasal cannula oxygen  Post-op Assessment: Report given to PACU RN, Post -op Vital signs reviewed and stable and Patient moving all extremities  Post vital signs: Reviewed and stable  Complications: No apparent anesthesia complications

## 2012-02-03 NOTE — Anesthesia Procedure Notes (Signed)
Procedure Name: Intubation Date/Time: 02/03/2012 1:23 PM Performed by: Rosita Fire Pre-anesthesia Checklist: Patient identified, Timeout performed, Emergency Drugs available, Suction available and Patient being monitored Patient Re-evaluated:Patient Re-evaluated prior to inductionOxygen Delivery Method: Circle System Utilized Preoxygenation: Pre-oxygenation with 100% oxygen Intubation Type: IV induction Ventilation: Oral airway inserted - appropriate to patient size and Two handed mask ventilation required Tube type: Oral Tube size: 7.5 mm Number of attempts: 1 Airway Equipment and Method: video-laryngoscopy Placement Confirmation: positive ETCO2 and breath sounds checked- equal and bilateral Tube secured with: Tape Dental Injury: Teeth and Oropharynx as per pre-operative assessment  Difficulty Due To: Difficulty was anticipated Future Recommendations: Recommend- induction with short-acting agent, and alternative techniques readily available

## 2012-02-03 NOTE — Anesthesia Postprocedure Evaluation (Signed)
Anesthesia Post Note  Patient: [name]  Procedure(s) Performed:  COMPUTER ASSISTED TOTAL KNEE ARTHROPLASTY - Right total knee arthroplasty  Anesthesia type: General  Patient location: PACU  Post pain: Pain level controlled and Adequate analgesia  Post assessment: Post-op Vital signs reviewed, Patient's Cardiovascular Status Stable, Respiratory Function Stable, Patent Airway and Pain level controlled  Last Vitals:  Filed Vitals:   02/03/12 1630  BP:   Pulse: 82  Temp:   Resp: 11    Post vital signs: Reviewed and stable  Level of consciousness: awake, alert  and oriented  Complications: No apparent anesthesia complications

## 2012-02-03 NOTE — Progress Notes (Signed)
Dr. Jean Rosenthal notified that pt. Has 1 cup black coffee @ 0900 this AM.

## 2012-02-04 ENCOUNTER — Encounter (HOSPITAL_COMMUNITY): Payer: Self-pay | Admitting: Orthopaedic Surgery

## 2012-02-04 LAB — CBC
MCV: 83.6 fL (ref 78.0–100.0)
Platelets: 240 10*3/uL (ref 150–400)
RBC: 3.85 MIL/uL — ABNORMAL LOW (ref 3.87–5.11)
RDW: 13.6 % (ref 11.5–15.5)
WBC: 9.9 10*3/uL (ref 4.0–10.5)

## 2012-02-04 LAB — BASIC METABOLIC PANEL
CO2: 30 mEq/L (ref 19–32)
Chloride: 99 mEq/L (ref 96–112)
Creatinine, Ser: 0.85 mg/dL (ref 0.50–1.10)
GFR calc Af Amer: 90 mL/min — ABNORMAL LOW (ref >90)
Potassium: 4 mEq/L (ref 3.5–5.1)
Sodium: 136 mEq/L (ref 135–145)

## 2012-02-04 LAB — GLUCOSE, CAPILLARY
Glucose-Capillary: 128 mg/dL — ABNORMAL HIGH (ref 70–99)
Glucose-Capillary: 132 mg/dL — ABNORMAL HIGH (ref 70–99)
Glucose-Capillary: 117 mg/dL — ABNORMAL HIGH (ref 70–99)

## 2012-02-04 NOTE — Progress Notes (Signed)
Physical Therapy Evaluation Patient Details Name: [name] MRN: [id number] DOB: 1961-12-08 Today's Date: 02/04/2012  Problem List:  Patient Active Problem List  Diagnoses  . CAD (coronary artery disease)  . Dyspnea  . Degenerative arthritis of right knee    Past Medical History:  Past Medical History  Diagnosis Date  . Asthma   . Hypertension   . CAD (coronary artery disease)     Cath 2010 with DES x 1 RCA  . Hyperlipidemia   . GERD (gastroesophageal reflux disease)   . Depression   . Diabetes mellitus 2010  . Arthritis     knees  . Abnormal uterine bleeding     last menstrual period 3 yrs ago.to be evalauated for uterine polyp  . Sleep apnea     never had sleep study  . Aortic stenosis, moderate     per 11/2011 echo   Past Surgical History:  Past Surgical History  Procedure Date  . Tubal ligation   . Carpal tunnel release     Bilateral  . Cholecystectomy   . Hernia repair   . Cardiac catheterization   . Coronary angioplasty     2010    PT Assessment/Plan/Recommendation PT Assessment Clinical Impression Statement: 51 yo female s/p RTKA presents with decr functional mobility and impairments listed below; will benefit from PT to maximize independence and safety with mobility/amb/steps/therex and enable safe dc home PT Recommendation/Assessment: Patient will need skilled PT in the acute care venue PT Problem List: Decreased strength;Decreased range of motion;Decreased activity tolerance;Decreased balance;Decreased mobility;Decreased coordination;Decreased knowledge of use of DME;Decreased knowledge of precautions;Obesity;Pain Barriers to Discharge Comments: Pin limiting mobility PT Therapy Diagnosis : Difficulty walking;Abnormality of gait;Acute pain PT Plan PT Frequency: 7X/week PT Treatment/Interventions: DME instruction;Gait training;Stair training;Functional mobility training;Therapeutic activities;Therapeutic exercise;Patient/family education PT  Recommendation Recommendations for Other Services: OT consult Follow Up Recommendations: Home health PT;Supervision/Assistance - 24 hour Equipment Recommended: Rolling walker with 5" wheels (pt may have 3in1; will need to confirm) PT Goals  Acute Rehab PT Goals PT Goal Formulation: With patient Time For Goal Achievement: 7 days Pt will go Supine/Side to Sit: with modified independence PT Goal: Supine/Side to Sit - Progress: Goal set today Pt will go Sit to Supine/Side: with modified independence PT Goal: Sit to Supine/Side - Progress: Goal set today Pt will go Sit to Stand: with supervision PT Goal: Sit to Stand - Progress: Goal set today Pt will go Stand to Sit: with supervision PT Goal: Stand to Sit - Progress: Goal set today Pt will Ambulate: 51 - 150 feet;with supervision;with rolling walker PT Goal: Ambulate - Progress: Goal set today Pt will Go Up / Down Stairs: 3-5 stairs;with min assist;with rail(s) PT Goal: Up/Down Stairs - Progress: Goal set today Pt will Perform Home Exercise Program: Independently PT Goal: Perform Home Exercise Program - Progress: Goal set today  PT Evaluation Precautions/Restrictions  Precautions Precautions: Knee Knee Immobilizer: On when out of bed or walking Restrictions RLE Weight Bearing: Weight bearing as tolerated Prior Functioning  Home Living Lives With: Spouse Receives Help From: Family Type of Home: House Home Layout: One level Home Access: Stairs to enter Entrance Stairs-Rails: Right Entrance Stairs-Number of Steps: 3 Home Adaptive Equipment: Bedside commode/3-in-1;Walker - rolling (Need to confirm home equipment) Prior Function Level of Independence: Independent with homemaking with ambulation Cognition Cognition Arousal/Alertness: Awake/alert Overall Cognitive Status: Appears within functional limits for tasks assessed Orientation Level: Oriented X4 Sensation/Coordination Sensation Light Touch: Appears  Intact Coordination Gross Motor Movements are Fluid  and Coordinated: No Fine Motor Movements are Fluid and Coordinated: Not tested Coordination and Movement Description: limited by body habitus Extremity Assessment RUE Assessment RUE Assessment: Within Functional Limits LUE Assessment LUE Assessment: Within Functional Limits (reports needs cortisone shot in shoulder) RLE Assessment RLE Assessment: Exceptions to [facility] RLE Strength RLE Overall Strength Comments: Decreased AROM and strength, limited by pain postop LLE Assessment LLE Assessment: Exceptions to [facility] LLE Strength LLE Overall Strength Comments: While ROM is grossly WFL, it is not pain-free; audible crepitus hip and knee Mobility (including Balance) Bed Mobility Bed Mobility: Yes Supine to Sit: 4: Min assist;With rails Supine to Sit Details (indicate cue type and reason): Pt was able to move to EOB with KI on LLE and use of rails, while the movement was inefficient, it was with only min assist Transfers Transfers: Yes Sit to Stand: 4: Min assist;From elevated surface;From bed;With upper extremity assist Sit to Stand Details (indicate cue type and reason): physical assist to steady RW during transfer; audible crepitus Right hip and knee Stand to Sit: 4: Min assist;With upper extremity assist;With armrests;To chair/3-in-1 Stand to Sit Details: cues for safety and control, and hand placement; decreased control with stand to sit Ambulation/Gait Ambulation/Gait: Yes Ambulation/Gait Assistance: 1: +2 Total assist;Patient percentage (comment) (pt=85%) Ambulation/Gait Assistance Details (indicate cue type and reason): cues for sequence, and to unweigh painful LLE by pushing down into RW Ambulation Distance (Feet): 4 Feet Assistive device: Rolling walker Gait Pattern: Step-to pattern    Exercise  Total Joint Exercises Ankle Circles/Pumps: AROM;Both;10 reps;Supine Quad Sets: AROM;5 reps;Right;Supine (limited by pain) Heel Slides:  AAROM;Right;5 reps;Supine (limited by pain) End of Session PT - End of Session Equipment Utilized During Treatment: Gait belt;Right knee immobilizer Activity Tolerance: Patient limited by pain Patient left: in chair;with call bell in reach;with family/visitor present (lunch delivered) General Behavior During Session: [facility] for tasks performed Cognition: [facility] for tasks performed  Van Clines [facility] [place], [place] [phone]  02/04/2012, 1:21 PM

## 2012-02-04 NOTE — Progress Notes (Signed)
CARE MANAGEMENT NOTE 02/04/2012 Discharge planning. Spoke with patient. Offered choice. She has her own walker. Will need 3in1 and CPM.

## 2012-02-04 NOTE — Op Note (Signed)
[name], [name] NO.:  [id number]  MEDICAL RECORD NO.:  [id number]  LOCATION:  5022                         FACILITY:  [facility]  PHYSICIAN:  Vanita Panda. Magnus Ivan, M.D.DATE OF BIRTH:  1961-11-24  DATE OF PROCEDURE:  02/03/2012 DATE OF DISCHARGE:                              OPERATIVE REPORT   PREOPERATIVE DIAGNOSIS:  Severe osteoarthritis and degenerative joint disease with varus deformity of right knee.  POSTOPERATIVE DIAGNOSIS:  Severe osteoarthritis and degenerative joint disease with varus deformity of right knee.  PROCEDURE:  Right primary total knee arthroplasty.  IMPLANTS:  De[employer]uy rotating platform knee with size 2.5 femur, size 2 tibia, MBT revision tray, 15-mm polyethylene insert, 32-mm patella button.  SURGEON:  Vanita Panda. Magnus Ivan, MD  ANESTHESIA:  General.  TOURNIQUET TIME:  Less than 2 hours.  ASSISTANT:  April Greene, RNFA.  BLOOD LOSS:  Less than 200 mL.  COMPLICATIONS:  None.  INDICATIONS:  [name] is a 51 year old morbidly obese female with bilateral knee severe osteoarthritis and varus deformities with end- stage arthritis and bone-on-bone wear, well documented on plain films. She understands the total knee arthroplasty with given her size and diabetes, it can be certainly be dangerous from a standpoint of blood clots and infection and she does wish to proceed with surgery given her poor quality of life and the severe pain she is having.  The risks and benefits of this were well understood and explained to her in detail and she did wish to proceed with surgery.  PROCEDURE DESCRIPTION:  After informed consent was obtained, appropriate right knee was marked.  She was brought to the operating room and placed supine on the operating table.  General anesthesia was then obtained.  A nonsterile tourniquet was placed on her upper right thigh and the right leg was prepped and draped from the thigh down the ankle with  DuraPrep and sterile drapes including sterile stockinette.  A time-out was called and she was identified as correct patient and correct right knee.  I then used an Esmarch to wrap out the leg and the tourniquet was inflated to 300 mm of pressure.  A midline incision was then made directly over the patella and carried proximally and distally.  I dissected down to the joint and then performed a medial parapatellar arthrotomy.  There was a large effusion encountered and once I got the knee open, you could see there was complete loss of the cartilage on the medial femoral condyle and the medial tibial plateau.  There was severe cartilage loss underneath the patella and trochlear groove and mild cartilage loss on the lateral compartment.  She has significant varus deformity as well. I cleaned the knee of debris and osteophytes including remnants of ACL, PCL, and meniscus.  I released tissue medially given the fact that she was in such a varus deformity.  Next, I proceeded with computer navigation portion of the case.  Two small stab incisions were made in the distal tibia and carried from anteromedial to posterolateral direction in the main incision in the metaphyseal section of the femur. Two Steinmann pins were also inserted in anteromedial to posterolateral. Of these Steinmann pins, small globes were placed so that we can  generate computer model of the knee, taking the hip's rotation followed by points throughout the knee and down the ankle.  Using computer plan and direct visualization, we decided to take 10 mm off the high side of our tibial cut when we made our tibia cut.  Once the tibia cut was made, I used soft tissue tensioners for balancing the knee in flexion and extension.  Next, we sized our femur for a size 2.5 femur and adjusted our computer plan accordingly.  We made our distal femoral cut and then used a flexion block to balance our knee, at first with a 12.5-mm insert on the  flexion block.  Next, we set our rotation guide for the femur and made our anterior and posterior cuts and our chamfer cuts.  We turned our attention back to the tibia and I decided to use an MBT revision tray from [employer]uy because of her size of being greater than 350 pounds.  I drilled the post and punched in a keel for a size 2 tibial revision tray.  We then placed the revision tray followed by the trial 2.5 femur. We trialed a 12.5 and then a 15 insert.  The 15 insert was very stable and using our navigation plan, we took her out of varus and we balanced in flexion and extension as well as medially and laterally.  We then drilled and cut for a 32 patellar button.  We then removed all trial components as well as all Steinmann pins.  I copiously irrigated the knee with normal saline solution using pulsatile lavage.  We then cemented the real MBT revision tray size 2, followed by cementing the real 2.5 femur.  We placed the 15-mm polyethylene insert and cemented the patellar button.  Once the cement had dried, we let the tourniquet down and hemostasis was obtained with electrocautery.  We copiously irrigated the joint with 3 L of normal saline solution using pulsatile lavage solution.  I then placed a medium Hemovac deep into the arthrotomy and closed the arthrotomy with interrupted #1 Vicryl suture, followed by 0 Vicryl in the deep tissue, 2-0 Vicryl in the subcutaneous tissue, and staples on the skin.  Xeroform followed by well-padded sterile dressing was applied.  Her knee was placed in a knee immobilizer.  She was awakened, extubated, and taken to the recovery room in stable condition.  All final counts were correct.  There were no complications noted.     Vanita Panda. Magnus Ivan, M.D.     CYB/MEDQ  D:  02/03/2012  T:  02/04/2012  Job:  [id number]

## 2012-02-04 NOTE — Evaluation (Signed)
Occupational Therapy Evaluation Patient Details Name: [name] MRN: [id number] DOB: 24-Aug-1961 Today's Date: 02/04/2012  Problem List:  Patient Active Problem List  Diagnoses  . CAD (coronary artery disease)  . Dyspnea  . Degenerative arthritis of right knee    Past Medical History:  Past Medical History  Diagnosis Date  . Asthma   . Hypertension   . CAD (coronary artery disease)     Cath 2010 with DES x 1 RCA  . Hyperlipidemia   . GERD (gastroesophageal reflux disease)   . Depression   . Diabetes mellitus 2010  . Arthritis     knees  . Abnormal uterine bleeding     last menstrual period 3 yrs ago.to be evalauated for uterine polyp  . Sleep apnea     never had sleep study  . Aortic stenosis, moderate     per 11/2011 echo   Past Surgical History:  Past Surgical History  Procedure Date  . Tubal ligation   . Carpal tunnel release     Bilateral  . Cholecystectomy   . Hernia repair   . Cardiac catheterization   . Coronary angioplasty     2010  . Knee arthroplasty 02/03/2012    Procedure: COMPUTER ASSISTED TOTAL KNEE ARTHROPLASTY;  Surgeon: [name], MD;  Location[facility] OR;  Service: Orthopedics;  Laterality: Right;  Right total knee arthroplasty    OT Assessment/Plan/Recommendation OT Assessment Clinical Impression Statement: This 51 y.o. female presents to OT s/p TKA.  Pt demonstrates the below listed problems and will benefit from OT to maximize safety and independence with BADLs to allow her to return home with family and min A OT Recommendation/Assessment: Patient will need skilled OT in the acute care venue OT Problem List: Decreased strength;Decreased activity tolerance;Impaired balance (sitting and/or standing);Decreased knowledge of use of DME or AE;Obesity;Pain Barriers to Discharge: None OT Therapy Diagnosis : Generalized weakness;Acute pain OT Plan OT Frequency: Min 2X/week OT Treatment/Interventions: Self-care/ADL training;DME and/or  AE instruction;Therapeutic activities;Patient/family education OT Recommendation Follow Up Recommendations: Home health OT Equipment Recommended: Tub/shower bench Individuals Consulted Consulted and Agree with Results and Recommendations: Patient OT Goals Acute Rehab OT Goals OT Goal Formulation: With patient  OT Evaluation Precautions/Restrictions  Precautions Precautions: Knee Required Braces or Orthoses: Yes Knee Immobilizer: On when out of bed or walking Restrictions Weight Bearing Restrictions: Yes RLE Weight Bearing: Weight bearing as tolerated Prior Functioning Home Living Lives With: Spouse Receives Help From: Family Type of Home: House Home Layout: One level Home Access: Stairs to enter Entrance Stairs-Rails: Right Entrance Stairs-Number of Steps: 3 Bathroom Shower/Tub: Forensic scientist: Standard Bathroom Accessibility: Yes How Accessible: Accessible via walker Home Adaptive Equipment: Walker - rolling Additional Comments: Pt will look into borrowing tub bench Prior Function Level of Independence: Independent with homemaking with ambulation Able to Take Stairs?: Yes Comments: Pt. reports husband very supportive and able to assist as necessary ADL ADL Eating/Feeding: Simulated;Independent Where Assessed - Eating/Feeding: Bed level Grooming: Simulated;Wash/dry hands;Wash/dry face;Minimal assistance Where Assessed - Grooming: Standing at sink Upper Body Bathing: Simulated;Set up Where Assessed - Upper Body Bathing: Unsupported;Sitting, chair;Sitting, bed Lower Body Bathing: Simulated;Maximal assistance Where Assessed - Lower Body Bathing: Sit to stand from chair;Sit to stand from bed Upper Body Dressing: Simulated;Set up Where Assessed - Upper Body Dressing: Unsupported;Sitting, bed;Sitting, chair Lower Body Dressing: Simulated;+1 Total assistance Where Assessed - Lower Body Dressing: Sit to stand from chair Toilet Transfer:  Simulated;Moderate assistance Toilet Transfer Method: Proofreader: Raised toilet  seat with arms (or 3-in-1 over toilet) Toileting - Clothing Manipulation: Simulated;Moderate assistance Where Assessed - Toileting Clothing Manipulation: Standing Toileting - Hygiene: Simulated;Moderate assistance Where Assessed - Toileting Hygiene: Standing Tub/Shower Transfer: Not assessed Equipment Used: Rolling walker Ambulation Related to ADLs: Pt. ambulates with min A ADL Comments: Pt. abducts hip and props knee/LE on bed/chair for LB ADLs PTA.  Will need AE at D/C Vision/Perception    Cognition Cognition Arousal/Alertness: Awake/alert Overall Cognitive Status: Appears within functional limits for tasks assessed Orientation Level: Oriented X4 Sensation/Coordination Coordination Gross Motor Movements are Fluid and Coordinated: Yes Fine Motor Movements are Fluid and Coordinated: Yes Extremity Assessment RUE Assessment RUE Assessment: Within Functional Limits LUE Assessment LUE Assessment: Within Functional Limits Mobility  Bed Mobility Bed Mobility: Yes Supine to Sit: 4: Min assist;With rails Transfers Transfers: Yes Sit to Stand: 4: Min assist;From bed;3: Mod assist;From chair/3-in-1 Stand to Sit: 4: Min assist;With upper extremity assist;With armrests;To chair/3-in-1 Exercises   End of Session OT - End of Session Equipment Utilized During Treatment: Right knee immobilizer Activity Tolerance: Patient limited by fatigue Patient left: in bed;with call bell in reach General Behavior During Session: [facility] for tasks performed Cognition: [facility] for tasks performed   Tyge Somers M 02/04/2012, 7:44 PM

## 2012-02-04 NOTE — Progress Notes (Addendum)
Physical Therapy Treatment Patient Details Name: ENEZ MONAHAN MRN: [id number] DOB: [birth year] Today's Date: 02/04/2012  PT Assessment/Plan  PT - Assessment/Plan Comments on Treatment Session: Fatigues quickly with activity; Pt pushing through pain to participate PT Plan: Discharge plan remains appropriate PT Frequency: 7X/week Recommendations for Other Services: OT consult Follow Up Recommendations: Home health PT;Supervision/Assistance - 24 hour Equipment Recommended:  (wide RW); wide 3in1  PT Goals  Acute Rehab PT Goals PT Goal Formulation: With patient Time For Goal Achievement: 7 days Pt will go Supine/Side to Sit: with modified independence PT Goal: Supine/Side to Sit - Progress: Progressing toward goal Pt will go Sit to Supine/Side: with modified independence PT Goal: Sit to Supine/Side - Progress: Progressing toward goal Pt will go Sit to Stand: with supervision PT Goal: Sit to Stand - Progress: Progressing toward goal Pt will go Stand to Sit: with supervision PT Goal: Stand to Sit - Progress: Progressing toward goal Pt will Ambulate: 51 - 150 feet;with supervision;with rolling walker PT Goal: Ambulate - Progress: Progressing toward goal Pt will Go Up / Down Stairs: 3-5 stairs;with min assist;with rail(s) PT Goal: Up/Down Stairs - Progress: Progressing toward goal Pt will Perform Home Exercise Program: Independently PT Goal: Perform Home Exercise Program - Progress: Other (comment)  PT Treatment Precautions/Restrictions  Precautions Precautions: Knee Required Braces or Orthoses: Yes Knee Immobilizer: On when out of bed or walking Restrictions Weight Bearing Restrictions: Yes RLE Weight Bearing: Weight bearing as tolerated Mobility (including Balance) Bed Mobility Bed Mobility: Yes Supine to Sit: 4: Min assist;With rails Supine to Sit Details (indicate cue type and reason): inefficient movement, but  Transfers Transfers: Yes Sit to Stand: 4: Min assist;From  bed;3: Mod assist;From chair/3-in-1 Sit to Stand Details (indicate cue type and reason): Dependent on momentum; First rep from bed requiring less assist; second rep from chair requiring mod assist secondary to fatigue (had walked to door prior to sitting Stand to Sit: 4: Min assist;With upper extremity assist;With armrests;To chair/3-in-1 Stand to Sit Details: cues for safety and control Ambulation/Gait Ambulation/Gait: Yes Ambulation/Gait Assistance: 1: +2 Total assist;Patient percentage (comment) (pt=85%) Ambulation/Gait Assistance Details (indicate cue type and reason): cues for sequence, and to activate Right quad in stance (inside KI); pt fatigues quickly, and with audible wheezing during last few feet of walk Ambulation Distance (Feet): 20 Feet (20 ft total; 15 then seated rest break, then 52ft) Assistive device: Rolling walker Gait Pattern: Step-to pattern   End of Session PT - End of Session Equipment Utilized During Treatment: Gait belt;Right knee immobilizer Activity Tolerance: Patient limited by pain Patient left: in bed;in CPM;with call bell in reach Nurse Communication: Mobility status for transfers;Mobility status for ambulation General Behavior During Session: [facility] for tasks performed Cognition: [facility] for tasks performed  Van Clines [facility] [place], [place] [phone]  02/04/2012, 3:26 PM

## 2012-02-04 NOTE — Progress Notes (Signed)
Subjective: 1 Day Post-Op Procedure(s) (LRB): COMPUTER ASSISTED TOTAL KNEE ARTHROPLASTY (Right) Patient reports pain as moderate.    Objective: Vital signs in last 24 hours: Temp:  [97.2 F (36.2 C)-99.6 F (37.6 C)] 97.8 F (36.6 C) (02/06 0648) Pulse Rate:  [80-105] 105  (02/06 0648) Resp:  [11-20] 20  (02/06 0648) BP: (139-159)/(67-90) 139/83 mmHg (02/06 0648) SpO2:  [94 %-99 %] 97 % (02/06 0648) Weight:  [152.862 kg (337 lb)] 152.862 kg (337 lb) (02/05 1820)  Intake/Output from previous day: 02/05 0701 - 02/06 0700 In: 4195 [P.O.:640; I.V.:3500; IV Piggyback:55] Out: 1325 [Urine:1225; Drains:100] Intake/Output this shift:     Basename 02/04/12 0500  HGB 10.5*    Basename 02/04/12 0500  WBC 9.9  RBC 3.85*  HCT 32.2*  PLT 240    Basename 02/04/12 0500  NA 136  K 4.0  CL 99  CO2 30  BUN 13  CREATININE 0.85  GLUCOSE 140*  CALCIUM 8.4    Basename 02/03/12 1036  LABPT --  INR 0.94    Sensation intact distally Intact pulses distally Dorsiflexion/Plantar flexion intact Incision: scant drainage Compartment soft  Assessment/Plan: 1 Day Post-Op Procedure(s) (LRB): COMPUTER ASSISTED TOTAL KNEE ARTHROPLASTY (Right) Up with therapy  [name] 02/04/2012, 7:01 AM

## 2012-02-05 LAB — CBC
MCH: 27.9 pg (ref 26.0–34.0)
MCHC: 33.2 g/dL (ref 30.0–36.0)
Platelets: 188 10*3/uL (ref 150–400)
RDW: 13.9 % (ref 11.5–15.5)

## 2012-02-05 LAB — GLUCOSE, CAPILLARY: Glucose-Capillary: 101 mg/dL — ABNORMAL HIGH (ref 70–99)

## 2012-02-05 MED ORDER — PNEUMOCOCCAL VAC POLYVALENT 25 MCG/0.5ML IJ INJ
0.5000 mL | INJECTION | INTRAMUSCULAR | Status: AC
  Administered 2012-02-06: 0.5 mL via INTRAMUSCULAR
  Filled 2012-02-05: qty 0.5

## 2012-02-05 NOTE — Progress Notes (Signed)
Utilization review completed. Shandra Szymborski, RN, BSN.  02/05/12 

## 2012-02-05 NOTE — Progress Notes (Signed)
Subjective: 2 Days Post-Op Procedure(s) (LRB): COMPUTER ASSISTED TOTAL KNEE ARTHROPLASTY (Right) Patient reports pain as moderate.    Objective: Vital signs in last 24 hours: Temp:  [98.2 F (36.8 C)-99.8 F (37.7 C)] 98.9 F (37.2 C) (02/07 0550) Pulse Rate:  [74-116] 100  (02/07 0550) Resp:  [12-20] 20  (02/07 0550) BP: (104-133)/(44-63) 116/44 mmHg (02/07 0550) SpO2:  [90 %-99 %] 97 % (02/07 0550)  Intake/Output from previous day: 02/06 0701 - 02/07 0700 In: 2281.3 [P.O.:960; I.V.:1321.3] Out: 200 [Urine:200] Intake/Output this shift:     Basename 02/04/12 0500  HGB 10.5*    Basename 02/04/12 0500  WBC 9.9  RBC 3.85*  HCT 32.2*  PLT 240    Basename 02/04/12 0500  NA 136  K 4.0  CL 99  CO2 30  BUN 13  CREATININE 0.85  GLUCOSE 140*  CALCIUM 8.4    Basename 02/03/12 1036  LABPT --  INR 0.94    Neurovascular intact Sensation intact distally Intact pulses distally Incision: scant drainage Compartment soft  Assessment/Plan: 2 Days Post-Op Procedure(s) (LRB): COMPUTER ASSISTED TOTAL KNEE ARTHROPLASTY (Right) Up with therapy Plan for discharge tomorrow  [name] 02/05/2012, 7:04 AM

## 2012-02-05 NOTE — Progress Notes (Signed)
Physical Therapy Treatment Patient Details Name: [name] MRN: [id number] DOB: Nov 27, 1961 Today's Date: 02/05/2012  PT Assessment/Plan  PT - Assessment/Plan Comments on Treatment Session: Continues to fatique quickly. PT Plan: Discharge plan remains appropriate Recommendations for Other Services: OT consult Follow Up Recommendations: Home health PT;Supervision/Assistance - 24 hour Equipment Recommended: Tub/shower bench PT Goals  Acute Rehab PT Goals PT Goal: Supine/Side to Sit - Progress: Progressing toward goal PT Goal: Sit to Stand - Progress: Progressing toward goal PT Goal: Stand to Sit - Progress: Progressing toward goal PT Goal: Ambulate - Progress: Progressing toward goal PT Goal: Perform Home Exercise Program - Progress: Progressing toward goal  PT Treatment Precautions/Restrictions  Precautions Precautions: Knee Required Braces or Orthoses: Yes Knee Immobilizer: On when out of bed or walking Restrictions Weight Bearing Restrictions: Yes RLE Weight Bearing: Weight bearing as tolerated Mobility (including Balance) Bed Mobility Bed Mobility: Yes Supine to Sit: 4: Min assist;With rails;HOB elevated (Comment degrees) (40 degrees) Supine to Sit Details (indicate cue type and reason): Assist needed for LLE.  Struggles with mobility. Transfers Transfers: Yes Sit to Stand: 4: Min assist;With upper extremity assist;From bed Sit to Stand Details (indicate cue type and reason): Physical assist to steady RW.  Pt required mod assist from recliner.  Cues for technique and hand placement. Stand to Sit: 4: Min assist;With upper extremity assist;With armrests;To chair/3-in-1 Stand to Sit Details: Cues for hand placement and technique Ambulation/Gait Ambulation/Gait: Yes Ambulation/Gait Assistance: 1: +2 Total assist;Patient percentage (comment) (pt=85%) Ambulation/Gait Assistance Details (indicate cue type and reason): Cues to sequence.  Fatiques quickly.  Amb on RA with  sats decreasing to 88%.  +1 to follow with chair secondary to fatiques. Ambulation Distance (Feet): 30 Feet (times 2) Assistive device: Rolling walker Gait Pattern: Step-to pattern    Exercise  Total Joint Exercises Ankle Circles/Pumps: AROM;Both;10 reps;Supine Quad Sets: AROM;Right;Supine;5 reps End of Session PT - End of Session Equipment Utilized During Treatment: Right knee immobilizer Activity Tolerance: Patient limited by fatigue Patient left: in chair;with call bell in reach General Behavior During Session: [facility] for tasks performed Cognition: [facility] for tasks performed  Newell Coral 02/05/2012, 12:31 PM Newell Coral, PTA [phone]

## 2012-02-06 LAB — GLUCOSE, CAPILLARY
Glucose-Capillary: 111 mg/dL — ABNORMAL HIGH (ref 70–99)
Glucose-Capillary: 80 mg/dL (ref 70–99)

## 2012-02-06 LAB — CBC
HCT: 24.6 % — ABNORMAL LOW (ref 36.0–46.0)
MCHC: 32.5 g/dL (ref 30.0–36.0)
MCV: 84 fL (ref 78.0–100.0)
Platelets: 182 10*3/uL (ref 150–400)
RDW: 13.9 % (ref 11.5–15.5)
WBC: 9.3 10*3/uL (ref 4.0–10.5)

## 2012-02-06 MED ORDER — METHOCARBAMOL 500 MG PO TABS: 500.0000 mg | ORAL_TABLET | Freq: Four times a day (QID) | ORAL | Status: AC | PRN

## 2012-02-06 MED ORDER — OXYCODONE-ACETAMINOPHEN 5-325 MG PO TABS: 1.0000 | ORAL_TABLET | ORAL | Status: AC | PRN

## 2012-02-06 NOTE — Discharge Summary (Signed)
Patient ID: [name] MRN: [id number] DOB/AGE: March 04, 1961 51 y.o.  Admit date: 02/03/2012 Discharge date: 02/06/2012  Admission Diagnoses:  Principal Problem:  *Degenerative arthritis of right knee   Discharge Diagnoses:  Same  Past Medical History  Diagnosis Date  . Asthma   . Hypertension   . CAD (coronary artery disease)     Cath 2010 with DES x 1 RCA  . Hyperlipidemia   . GERD (gastroesophageal reflux disease)   . Depression   . Diabetes mellitus 2010  . Arthritis     knees  . Abnormal uterine bleeding     last menstrual period 3 yrs ago.to be evalauated for uterine polyp  . Sleep apnea     never had sleep study  . Aortic stenosis, moderate     per 11/2011 echo    Surgeries: Procedure(s): COMPUTER ASSISTED TOTAL KNEE ARTHROPLASTY on 02/03/2012   Consultants:    Discharged Condition: Improved  Hospital Course: [name] is an 51 y.o. female who was admitted 02/03/2012 for operative treatment ofDegenerative arthritis of right knee. Patient has severe unremitting pain that affects sleep, daily activities, and work/hobbies. After pre-op clearance the patient was taken to the operating room on 02/03/2012 and underwent  Procedure(s): COMPUTER ASSISTED TOTAL KNEE ARTHROPLASTY.    Patient was given perioperative antibiotics: Anti-infectives     Start     Dose/Rate Route Frequency Ordered Stop   02/03/12 1845   ceFAZolin (ANCEF) IVPB 1 g/50 mL premix        1 g 100 mL/hr over 30 Minutes Intravenous Every 6 hours 02/03/12 1833 02/04/12 1029   02/02/12 1500   ceFAZolin (ANCEF) IVPB 2 g/50 mL premix        2 g 100 mL/hr over 30 Minutes Intravenous 60 min pre-op 02/02/12 1448 02/03/12 1335           Patient was given sequential compression devices, early ambulation, and chemoprophylaxis to prevent DVT.  Patient benefited maximally from hospital stay and there were no complications.    Recent vital signs: Patient Vitals for the past 24 hrs:  BP Temp  Temp src Pulse Resp SpO2  02/06/12 0600 116/49 mmHg 98.6 F (37 C) Oral 97  20  95 %  02/06/12 0229 129/50 mmHg 100.1 F (37.8 C) Oral 111  20  90 %  03/02/2012 2300 101/39 mmHg 100.5 F (38.1 C) - 111  - 88 %  02-Mar-2012 2126 - - - 87  18  93 %  02-Mar-2012 1530 131/50 mmHg 98.9 F (37.2 C) - 92  20  96 %  Mar 02, 2012 0758 - - - - - 95 %     Recent laboratory studies:  Basename 02/06/12 0530 03/02/2012 0655 02/04/12 0500 02/03/12 1036  WBC 9.3 10.8* -- --  HGB 8.0* 8.6* -- --  HCT 24.6* 25.9* -- --  PLT 182 188 -- --  NA -- -- 136 --  K -- -- 4.0 --  CL -- -- 99 --  CO2 -- -- 30 --  BUN -- -- 13 --  CREATININE -- -- 0.85 --  GLUCOSE -- -- 140* --  INR -- -- -- 0.94  CALCIUM -- -- 8.4 --     Discharge Medications:   Medication List  As of 02/06/2012  6:58 AM   TAKE these medications         albuterol 108 (90 BASE) MCG/ACT inhaler   Commonly known as: PROVENTIL HFA;VENTOLIN HFA   Inhale 1 puff into the lungs  every 6 (six) hours as needed. Shortness of breath.      atorvastatin 40 MG tablet   Commonly known as: LIPITOR   Take 40 mg by mouth at bedtime.      clopidogrel 75 MG tablet   Commonly known as: PLAVIX   Take 75 mg by mouth daily.      diclofenac 25 MG EC tablet   Commonly known as: VOLTAREN   Take 25 mg by mouth 2 (two) times daily.      escitalopram 20 MG tablet   Commonly known as: LEXAPRO   Take 20 mg by mouth daily.      Fluticasone-Salmeterol 250-50 MCG/DOSE Aepb   Commonly known as: ADVAIR   Inhale 1 puff into the lungs 2 (two) times daily.      gabapentin 300 MG capsule   Commonly known as: NEURONTIN   Take 300 mg by mouth daily. Patient states she takes it once daily. Per patient.      glipiZIDE 5 MG tablet   Commonly known as: GLUCOTROL   Take 5 mg by mouth 2 (two) times daily before a meal.      hydrochlorothiazide 25 MG tablet   Commonly known as: HYDRODIURIL   Take 25 mg by mouth daily.      ibuprofen 200 MG tablet   Commonly known as:  ADVIL,MOTRIN   Take 200 mg by mouth every 6 (six) hours as needed. For pain      losartan 100 MG tablet   Commonly known as: COZAAR   Take 100 mg by mouth daily.      methocarbamol 500 MG tablet   Commonly known as: ROBAXIN   Take 1 tablet (500 mg total) by mouth every 6 (six) hours as needed.      oxyCODONE-acetaminophen 5-325 MG per tablet   Commonly known as: PERCOCET   Take 1-2 tablets by mouth every 4 (four) hours as needed for pain.            Diagnostic Studies: Dg Chest 2 View  01/29/2012  *RADIOLOGY REPORT*  Clinical Data: Preop for right total knee replacement.  History of hypertension and coronary artery disease.  CHEST - 2 VIEW  Comparison: Chest x-ray 11/19/2011.  Findings: Lung volumes are normal.  No consolidative airspace disease.  No pleural effusions.  No pneumothorax.  No pulmonary nodule or mass noted.  Pulmonary vasculature and the cardiomediastinal silhouette are within normal limits. Atherosclerotic calcifications in the arch of the aorta.  Prominent pericardial fat pad again noted.  IMPRESSION: 1. No radiographic evidence of acute cardiopulmonary disease. 2.  Atherosclerosis.  Original Report Authenticated By: Florencia Reasons, M.D.   X-ray Knee Right Port  02/03/2012  *RADIOLOGY REPORT*  Clinical Data: Post right knee arthroplasty  PORTABLE RIGHT KNEE - 1-2 VIEW  Comparison: Portable exam 1647 hours compared to 01/08/2008  Findings: Components of right knee prosthesis in expected positions. Scattered soft tissue swelling. Surgical drain and skin clips. No dislocation seen. Tiny bone fragment identified at the medial margin of the proximal tibia compatible with tiny fracture fragment. No definite fracture plane is seen traversing the tibial metaphysis. Uncertain whether this traverses to the medial margin of the tibia, medial to the tibial plateau component.  IMPRESSION: Components of right knee prosthesis in expected position. Small bone fragment identified at medial  margin of proximal medial metaphysis question tiny avulsion fracture though cannot exclude extension to the medial margin of the proximal tibia.  Original Report Authenticated By: Redge Gainer.  Tyron Russell, M.D.    Disposition: Home or Self Care       Signed: [name] 02/06/2012, 6:58 AM

## 2012-02-06 NOTE — Progress Notes (Signed)
Physical Therapy Treatment Patient Details Name: [name] MRN: [id number] DOB: May 19, 1961 Today's Date: 02/06/2012  PT Assessment/Plan  PT - Assessment/Plan Comments on Treatment Session: Pt's spouse educated on assisting pt. on steps.  She is ready for DC from Pt perspective. PT Plan: Discharge plan remains appropriate PT Frequency: 7X/week Follow Up Recommendations: Home health PT PT Goals  Acute Rehab PT Goals PT Goal: Supine/Side to Sit - Progress: Progressing toward goal PT Goal: Sit to Stand - Progress: Progressing toward goal PT Goal: Stand to Sit - Progress: Progressing toward goal PT Goal: Ambulate - Progress: Progressing toward goal PT Goal: Up/Down Stairs - Progress: Progressing toward goal PT Goal: Perform Home Exercise Program - Progress: Progressing toward goal  PT Treatment Precautions/Restrictions  Precautions Precautions: Knee Required Braces or Orthoses: No (KI already taken to car per pt.) Knee Immobilizer: On when out of bed or walking Restrictions Weight Bearing Restrictions: Yes RLE Weight Bearing: Weight bearing as tolerated Mobility (including Balance) Bed Mobility Bed Mobility: Yes Supine to Sit: 5: Supervision;With rails;HOB flat Supine to Sit Details (indicate cue type and reason): min assist for right LE management Transfers Transfers: Yes Sit to Stand: 4: Min assist;With upper extremity assist;From bed Sit to Stand Details (indicate cue type and reason): assist to steady RW and reminder of safe  hand placement Stand to Sit: 4: Min assist;With armrests;To chair/3-in-1 Stand to Sit Details: cues for hand placement and technique Ambulation/Gait Ambulation/Gait: No Ambulation/Gait Assistance: 4: Min assist (second person to push recliner behind pt.) Ambulation/Gait Assistance Details (indicate cue type and reason): cues for sequence.  Pt. fatigues quickly, needs frequent standing rest breaks with forearms on RW.  O2 sats down to 89% at the  lowest during mobility. Ambulation Distance (Feet): 30 Feet Assistive device: Rolling walker Gait Pattern: Step-to pattern Stairs: Yes Stairs Assistance: 1: +2 Total assist;Patient percentage (comment) (pt. 85% , second person for safety) Stairs Assistance Details (indicate cue type and reason): cues for sequence, technique and safety Stair Management Technique: One rail Right;Step to pattern;Sideways Number of Stairs: 2  Height of Stairs: 6     Exercise  Total Joint Exercises Ankle Circles/Pumps: AROM;Both;10 reps;Supine Quad Sets: AROM;Right;10 reps;Supine Short Arc Quad: AAROM;Right;Supine;5 reps Knee Flexion: AAROM;Right;5 reps;Seated;Other (comment) (AA ROM 0-60 degrees) End of Session PT - End of Session Equipment Utilized During Treatment: Gait belt (KI already in car) Activity Tolerance: Patient limited by fatigue;Patient limited by pain Patient left: in chair;with call bell in reach;with family/visitor present Nurse Communication: Mobility status for transfers;Mobility status for ambulation;Weight bearing status General Behavior During Session: [facility] for tasks performed Cognition: [facility] for tasks performed  Ferman Hamming 02/06/2012, 4:15 PM Acute Rehabilitation Services [phone] (pager)

## 2012-02-06 NOTE — Progress Notes (Signed)
Physical Therapy Treatment Patient Details Name: [name] MRN: [id number] DOB: 07/05/1961 Today's Date: 02/06/2012  PT Assessment/Plan  PT - Assessment/Plan Comments on Treatment Session: Pt. limited by dyspnea and fatigue, but able to negotiate 2 steps with assist.  She feels she and husband will be able to manage getting her into home.  Will see this pm to review steps again before DC. PT Plan: Discharge plan remains appropriate PT Frequency: 7X/week Follow Up Recommendations: Home health PT PT Goals  Acute Rehab PT Goals PT Goal: Supine/Side to Sit - Progress: Progressing toward goal PT Goal: Sit to Stand - Progress: Progressing toward goal PT Goal: Stand to Sit - Progress: Progressing toward goal PT Goal: Ambulate - Progress: Progressing toward goal PT Goal: Up/Down Stairs - Progress: Progressing toward goal PT Goal: Perform Home Exercise Program - Progress: Progressing toward goal  PT Treatment Precautions/Restrictions  Precautions Precautions: Knee Required Braces or Orthoses: Yes Knee Immobilizer: On when out of bed or walking Restrictions Weight Bearing Restrictions: Yes RLE Weight Bearing: Weight bearing as tolerated Mobility (including Balance) Bed Mobility Bed Mobility: Yes Supine to Sit: 4: Min assist;HOB flat Supine to Sit Details (indicate cue type and reason): min assist for right LEmanagement.  Taught pt. use of sheet to move own right LE. Transfers Transfers: Yes Sit to Stand: 4: Min assist;With upper extremity assist;From bed Sit to Stand Details (indicate cue type and reason): assist to steady RW and reminder of safe  hand placement Stand to Sit: 4: Min assist;With armrests;To chair/3-in-1 Stand to Sit Details: cues for hand placement and technique Ambulation/Gait Ambulation/Gait: Yes Ambulation/Gait Assistance: 4: Min assist (second person to push recliner behind pt.) Ambulation/Gait Assistance Details (indicate cue type and reason): cues for  sequence.  Pt. fatigues quickly, needs frequent standing rest breaks with forearms on RW.  O2 sats down to 89% at the lowest during mobility. Ambulation Distance (Feet): 30 Feet Assistive device: Rolling walker Gait Pattern: Step-to pattern Stairs: Yes Stairs Assistance: 1: +2 Total assist;Patient percentage (comment) (pt. 85%, second person mostly for safety) Stairs Assistance Details (indicate cue type and reason): cues for sequence, technique and safety Stair Management Technique: One rail Right;Step to pattern;Sideways Number of Stairs: 2  Height of Stairs: 6     Exercise  Total Joint Exercises Ankle Circles/Pumps: AROM;Both;10 reps;Supine Quad Sets: AROM;Right;10 reps;Supine Short Arc Quad: AAROM;Right;Supine;5 reps Knee Flexion: AAROM;Right;5 reps;Seated;Other (comment) (AA ROM 0-60 degrees) End of Session PT - End of Session Equipment Utilized During Treatment: Gait belt;Right knee immobilizer Activity Tolerance: Patient limited by fatigue Patient left: in chair;with call bell in reach Nurse Communication: Mobility status for transfers;Mobility status for ambulation;Weight bearing status General Behavior During Session: [facility] for tasks performed Cognition: [facility] for tasks performed  Ferman Hamming 02/06/2012, 1:03 PM Acute Rehabilitation Services [phone] (pager)

## 2012-02-06 NOTE — Progress Notes (Signed)
Pt is S/P R TKR. Pt has a tegaderm dressing to R knee that has only a small amount of shadow serosanquinous drainage noted. Dressing was changed per MD this AM per pt's admission. Pt is WBAT with RW. Knee precautions. CPM per order. Pt ambulates with one assist and RW. Pt is alert and oriented x 3. Pt neuro check is negative. Pt lungs CTA but noted to be diminished in all lobes. Pt sats 94% on RA. Pt repts a prod cough with a small amount of thin clear phlegm. IS per order. Pt repts that she has a baseline of being "SOB" with min exertion and pt denies any change in this SOB. Pt, per rept, oxygen levels dropped during the night and had to have oxygen applied. Heart rate regular rate and rhythm. No s/sx resp or cardiac distress and no c/o such. Vital signs are stable now. Pt noted to have an oxygen level of 92% RA even with exertion. Pt has generalized edema but edema is worse of RLE. RLE 1+ pitting edema. Abdomen is soft nontender and nondistended. BS faint and hypoactive x 4. Pt repts passing gas and tolerates diet fair. Pt denies nausea or vomiting. Pt has no pressure skin issues. Pt can turn self and floats heels. Pt noted to have episodic bruising of BUE related to VP's here in the hospital. Rept to Dr. Magnus Ivan regarding pt's oxygen levels dropping during the night and DVT prophylaxis for home being Plavix only. Plan is to d/c to home today. No new orders. Plan is to proceed with discharge to home.

## 2012-02-06 NOTE — Progress Notes (Signed)
Occupational Therapy Treatment Patient Details Name: [name] MRN: [id number] DOB: 06-30-1961 Today's Date: 02/06/2012  OT Assessment/Plan OT Assessment/Plan Comments on Treatment Session: Pt. demonstrates significantly improved ability to perform BADLs.  Pt. provided with AE (she has reacher at home).  Pt. fatigues quickly requiring 3 rest breaks.  Encouraged use of incentive spirometer due to dyspnea 3/4 OT Plan: Discharge plan remains appropriate OT Frequency: Min 2X/week Follow Up Recommendations: Home health OT Equipment Recommended:  OT Goals ADL Goals ADL Goal: Lower Body Bathing - Progress: Met ADL Goal: Lower Body Dressing - Progress: Met ADL Goal: Toilet Transfer - Progress: Met ADL Goal: Toileting - Clothing Manipulation - Progress: Met ADL Goal: Toileting - Hygiene - Progress: Met ADL Goal: Tub/Shower Transfer - Progress: Discontinued (comment)  OT Treatment Precautions/Restrictions  Precautions Precautions: Knee Required Braces or Orthoses: No Knee Immobilizer: On when out of bed or walking Restrictions Weight Bearing Restrictions: Yes RLE Weight Bearing: Weight bearing as tolerated   ADL ADL Lower Body Bathing: Supervision/safety;Simulated Where Assessed - Lower Body Bathing: Sit to stand from chair (using AE) Upper Body Dressing: Simulated;Set up Where Assessed - Upper Body Dressing: Unsupported;Sitting, chair Lower Body Dressing: Performed;Minimal assistance Where Assessed - Lower Body Dressing: Sit to stand from chair (using AE) Toilet Transfer: Performed;Supervision/safety Toilet Transfer Method: Proofreader: Raised toilet seat with arms (or 3-in-1 over toilet) Toileting - Clothing Manipulation: Performed;Supervision/safety Where Assessed - Toileting Clothing Manipulation: Standing Toileting - Hygiene: Simulated;Modified independent Where Assessed - Toileting Hygiene: Sit to stand from 3-in-1 or toilet Tub/Shower  Transfer:  (pt will sponge bathe until able to step over tub) Equipment Used: Rolling walker Ambulation Related to ADLs: Pt ambulated to BR and in room with Supervision ADL Comments: Pt. was instructed in use of AE with LB ADLs, and was provided with LH sponge, sock aid, and LH shoe horn from indigent supply Mobility  Bed Mobility Bed Mobility: Yes Supine to Sit: 5: Supervision;With rails;HOB flat Supine to Sit Details (indicate cue type and reason): min assist for right LE management Transfers Transfers: Yes Sit to Stand: 5: Supervision Stand to Sit: 5: Supervision Exercises    End of Session OT - End of Session Activity Tolerance: Patient limited by fatigue Patient left: in bed;with call bell in reach;with family/visitor present General Behavior During Session: [facility] for tasks performed Cognition: [facility] for tasks performed  Mccoy Testa M  02/06/2012, 6:51 PM

## 2012-02-08 ENCOUNTER — Emergency Department (HOSPITAL_COMMUNITY): Payer: Medicaid Other

## 2012-02-08 ENCOUNTER — Inpatient Hospital Stay (HOSPITAL_COMMUNITY)
Admission: EM | Admit: 2012-02-08 | Discharge: 2012-02-19 | DRG: 176 | Disposition: A | Discharge status: Home / Self Care | Payer: Medicaid Other | Attending: Internal Medicine | Admitting: Internal Medicine

## 2012-02-08 ENCOUNTER — Encounter (HOSPITAL_COMMUNITY): Payer: Self-pay | Admitting: *Deleted

## 2012-02-08 ENCOUNTER — Other Ambulatory Visit: Payer: Self-pay

## 2012-02-08 DIAGNOSIS — E785 Hyperlipidemia, unspecified: Secondary | ICD-10-CM | POA: Diagnosis present

## 2012-02-08 DIAGNOSIS — D62 Acute posthemorrhagic anemia: Secondary | ICD-10-CM | POA: Diagnosis present

## 2012-02-08 DIAGNOSIS — J189 Pneumonia, unspecified organism: Secondary | ICD-10-CM

## 2012-02-08 DIAGNOSIS — J4489 Other specified chronic obstructive pulmonary disease: Secondary | ICD-10-CM | POA: Diagnosis present

## 2012-02-08 DIAGNOSIS — E876 Hypokalemia: Secondary | ICD-10-CM | POA: Diagnosis present

## 2012-02-08 DIAGNOSIS — Z96659 Presence of unspecified artificial knee joint: Secondary | ICD-10-CM

## 2012-02-08 DIAGNOSIS — E119 Type 2 diabetes mellitus without complications: Secondary | ICD-10-CM

## 2012-02-08 DIAGNOSIS — R310 Gross hematuria: Secondary | ICD-10-CM | POA: Diagnosis not present

## 2012-02-08 DIAGNOSIS — D649 Anemia, unspecified: Secondary | ICD-10-CM

## 2012-02-08 DIAGNOSIS — Z87891 Personal history of nicotine dependence: Secondary | ICD-10-CM

## 2012-02-08 DIAGNOSIS — Y95 Nosocomial condition: Secondary | ICD-10-CM | POA: Diagnosis present

## 2012-02-08 DIAGNOSIS — I5030 Unspecified diastolic (congestive) heart failure: Secondary | ICD-10-CM | POA: Diagnosis present

## 2012-02-08 DIAGNOSIS — J449 Chronic obstructive pulmonary disease, unspecified: Secondary | ICD-10-CM | POA: Diagnosis present

## 2012-02-08 DIAGNOSIS — J45909 Unspecified asthma, uncomplicated: Secondary | ICD-10-CM | POA: Diagnosis present

## 2012-02-08 DIAGNOSIS — R0602 Shortness of breath: Secondary | ICD-10-CM | POA: Diagnosis present

## 2012-02-08 DIAGNOSIS — K219 Gastro-esophageal reflux disease without esophagitis: Secondary | ICD-10-CM | POA: Diagnosis present

## 2012-02-08 DIAGNOSIS — I2699 Other pulmonary embolism without acute cor pulmonale: Secondary | ICD-10-CM

## 2012-02-08 DIAGNOSIS — I509 Heart failure, unspecified: Secondary | ICD-10-CM | POA: Diagnosis present

## 2012-02-08 DIAGNOSIS — I10 Essential (primary) hypertension: Secondary | ICD-10-CM | POA: Diagnosis present

## 2012-02-08 DIAGNOSIS — N200 Calculus of kidney: Secondary | ICD-10-CM | POA: Diagnosis present

## 2012-02-08 DIAGNOSIS — R040 Epistaxis: Secondary | ICD-10-CM | POA: Diagnosis not present

## 2012-02-08 DIAGNOSIS — I2510 Atherosclerotic heart disease of native coronary artery without angina pectoris: Secondary | ICD-10-CM | POA: Diagnosis present

## 2012-02-08 DIAGNOSIS — R319 Hematuria, unspecified: Secondary | ICD-10-CM | POA: Diagnosis not present

## 2012-02-08 HISTORY — DX: Other pulmonary embolism without acute cor pulmonale: I26.99

## 2012-02-08 HISTORY — DX: Type 2 diabetes mellitus without complications: E11.9

## 2012-02-08 HISTORY — DX: Anemia, unspecified: D64.9

## 2012-02-08 HISTORY — DX: Pneumonia, unspecified organism: J18.9

## 2012-02-08 LAB — CARDIAC PANEL(CRET KIN+CKTOT+MB+TROPI)
CK, MB: 4 ng/mL (ref 0.3–4.0)
Troponin I: <0.3 ng/mL (ref <0.30)
Troponin I: 0.35 ng/mL (ref <0.30)

## 2012-02-08 LAB — CBC
MCH: 27.1 pg (ref 26.0–34.0)
MCV: 82.5 fL (ref 78.0–100.0)
Platelets: 272 10*3/uL (ref 150–400)
RDW: 14 % (ref 11.5–15.5)

## 2012-02-08 LAB — POCT I-STAT, CHEM 8
Creatinine, Ser: 0.9 mg/dL (ref 0.50–1.10)
Glucose, Bld: 155 mg/dL — ABNORMAL HIGH (ref 70–99)
Hemoglobin: 8.5 g/dL — ABNORMAL LOW (ref 12.0–15.0)
Potassium: 3.2 mEq/L — ABNORMAL LOW (ref 3.5–5.1)

## 2012-02-08 LAB — GLUCOSE, CAPILLARY: Glucose-Capillary: 172 mg/dL — ABNORMAL HIGH (ref 70–99)

## 2012-02-08 IMAGING — CT CT ANGIO CHEST
1 of 2 series · 17 of 32 positions shown · IV contrast (APPLIED)
Comparison: Chest radiograph dated [DATE], CT chest dated
[DATE]
COMPARISON: Chest radiograph dated [DATE], CT chest dated
[DATE]

<!--  IDXRADR:ADDEND:BEGIN -->Addendum Begins
<!--  IDXRADR:ADDEND:INNER_BEGIN -->***ADDENDUM*** CREATED: [DATE] [DATE]

As noted in the body of the report, the primary differential
consideration for the bilateral pulmonary opacities is interstitial
edema.  Infection remains favored due to lack of pleural effusions
or cardiomegaly.
***END ADDENDUM*** SIGNED BY: TRANG, M.D.
CLINICAL DATA: Shortness of breath, status post total knee
replacement, evaluate for PE
CT ANGIOGRAPHY CHEST
TECHNIQUE: Multidetector CT imaging of the chest using the
standard protocol during bolus administration of intravenous
contrast. Multiplanar reconstructed images including MIPs were
obtained and reviewed to evaluate the vascular anatomy.
Contrast: 100mL OMNIPAQUE IOHEXOL 300 MG/ML IV SOLN

[Series 5: thins for pacs · axial · 0.80mm/px · z∈[-319,-73]mm · 17 of 270 slices shown]
[im 12/270  lung]
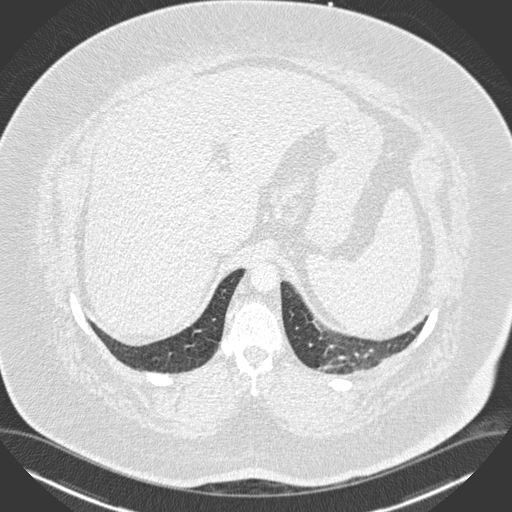
[im 24/270  soft-tissue]
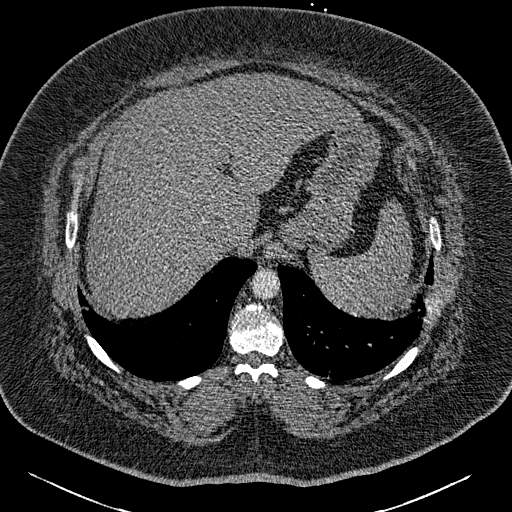
[im 47/270  lung]
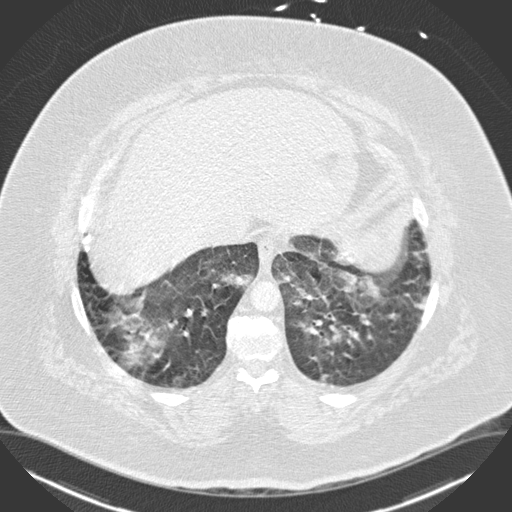
[im 59/270  soft-tissue]
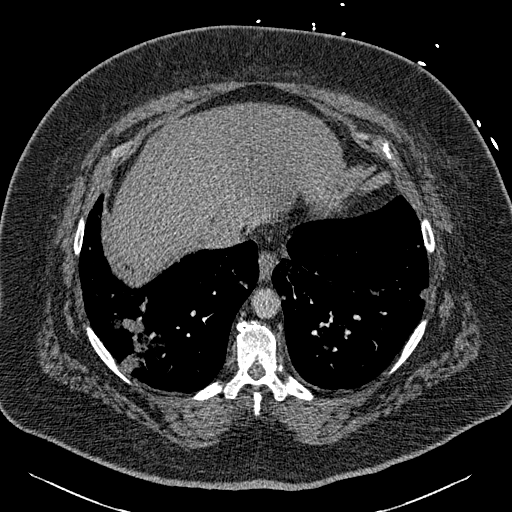
[im 71/270  lung]
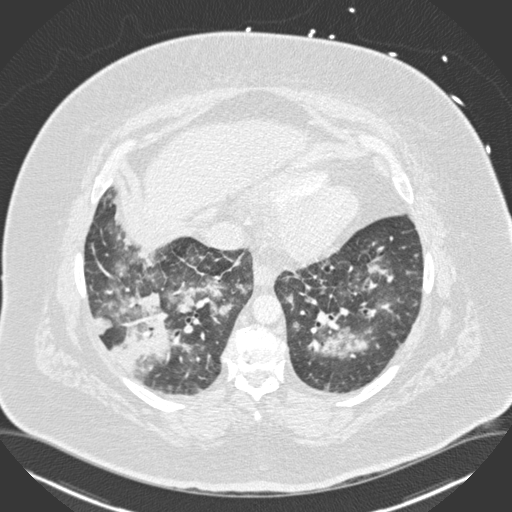
[im 94/270  soft-tissue]
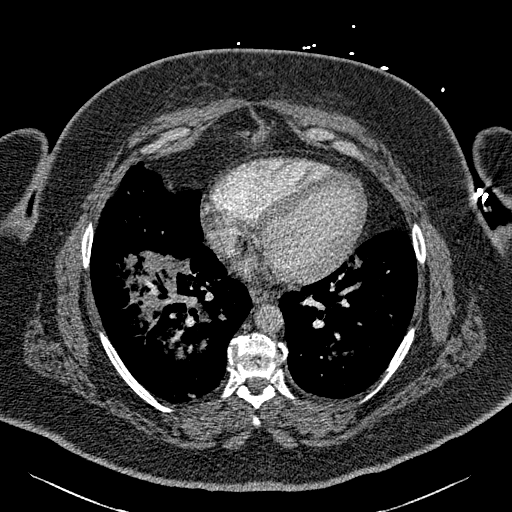
[im 106/270  lung]
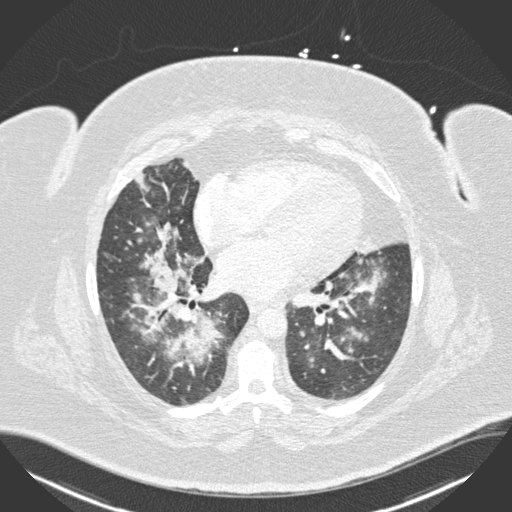
[im 117/270  soft-tissue]
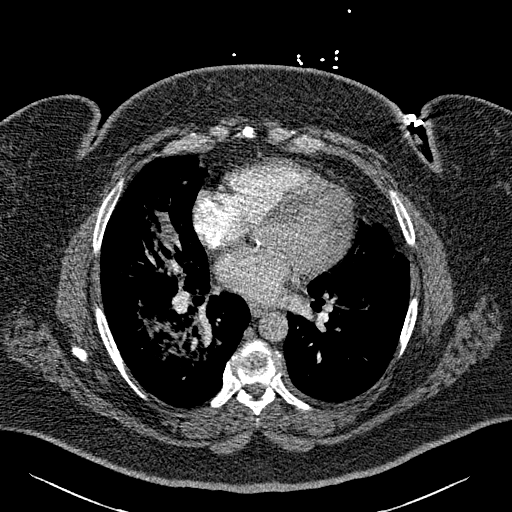
[im 141/270  lung]
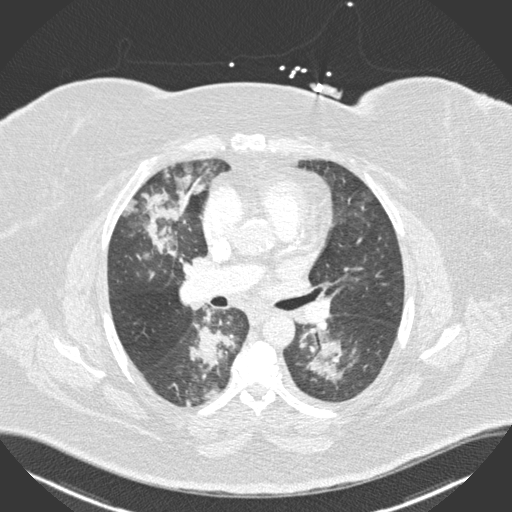
[im 153/270  soft-tissue]
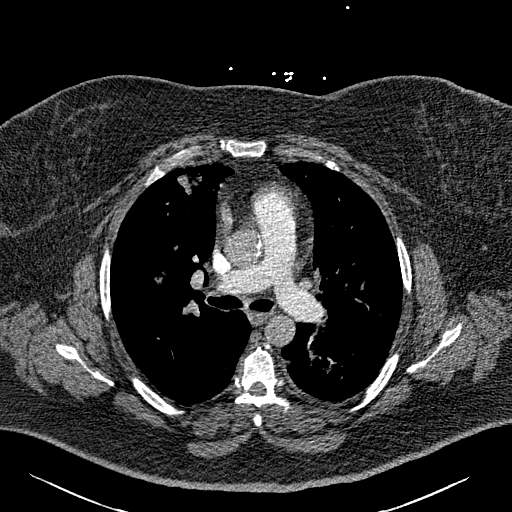
[im 164/270  lung]
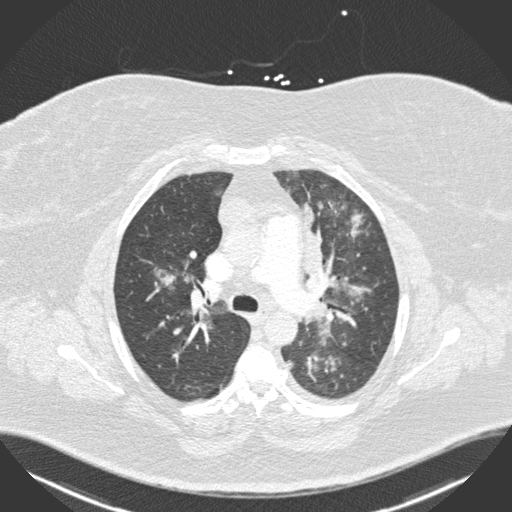
[im 176/270  soft-tissue]
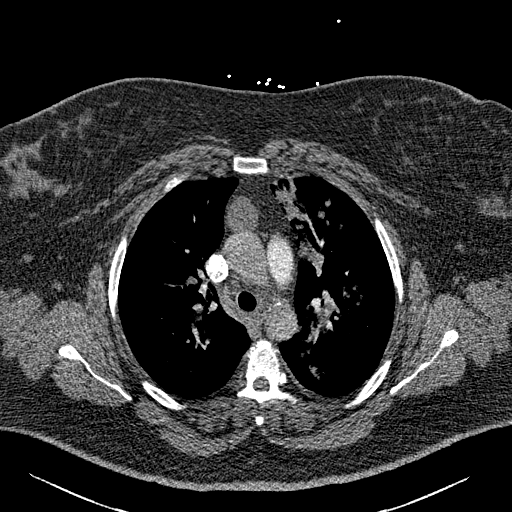
[im 199/270  lung]
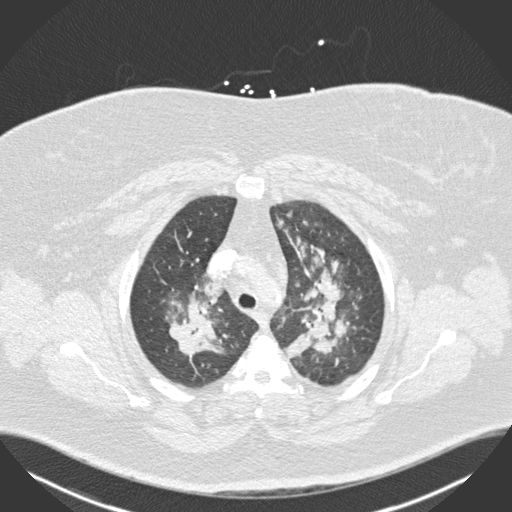
[im 211/270  soft-tissue]
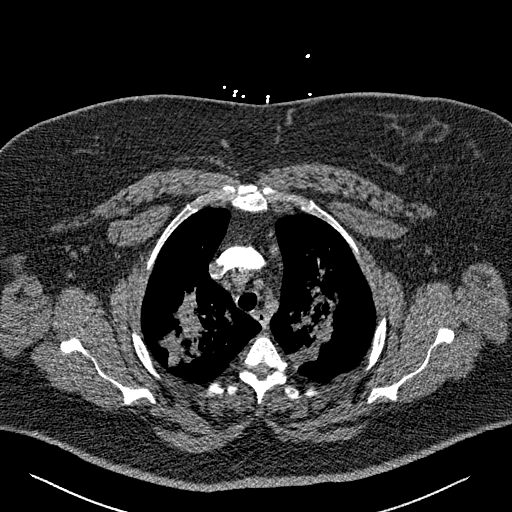
[im 223/270  lung]
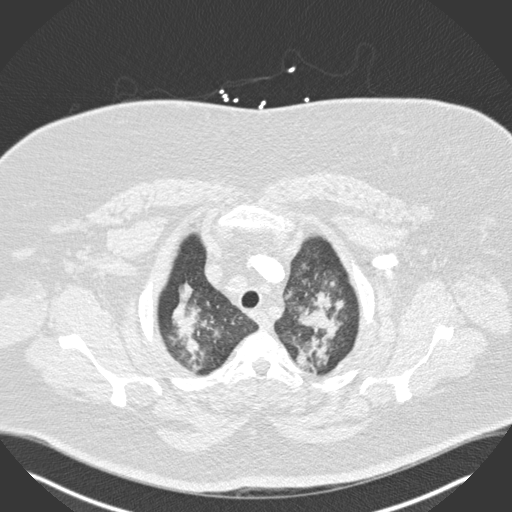
[im 246/270  soft-tissue]
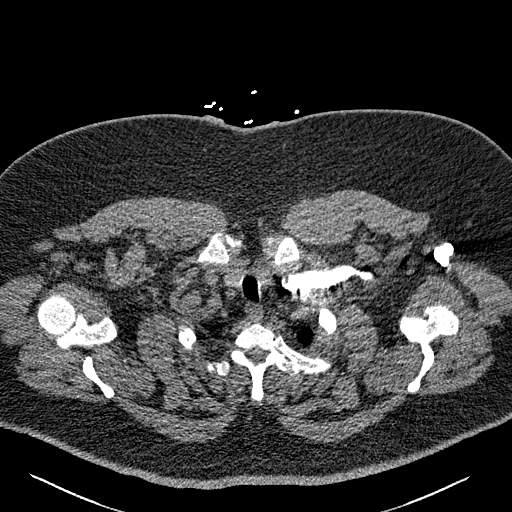
[im 258/270  lung]
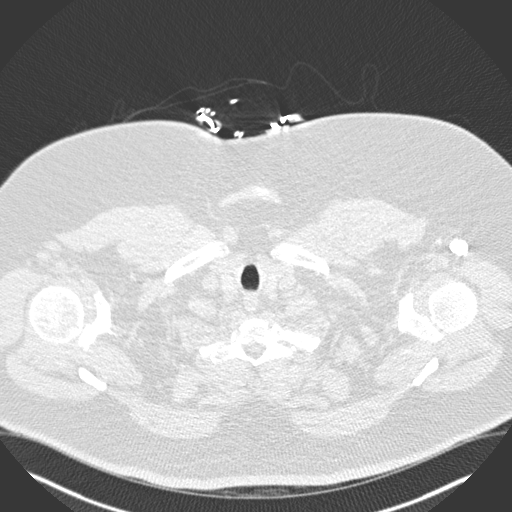

[17 of 32 positions shown; findings below may reference images not displayed]

FINDINGS: Suspected small subsegmental filling defects within
branches of the right upper lobe pulmonary artery (series 5/images
97 and 105).  If this is a true finding, the overall clot burden is
very small.

Patchy/nodular multifocal opacities throughout all lobes,
suspicious for multifocal pneumonia.  Interstitial edema is
considered unlikely given the nodular appearance.  No pleural
effusion or pneumothorax.

Visualized thyroid is unremarkable.

2.5 x 2.6 cm fluid density lesion in the anterior mediastinum,
likely reflecting a pericardial cyst, similar to [SM].

Small mediastinal lymph nodes measuring up to 11 mm short axis
(series 4/image 33), likely reactive.  No suspicious hilar or
axillary lymphadenopathy.

Visualized upper abdomen is unremarkable.

Nonobstructive bowel gas pattern.
IMPRESSION: Suspected small subsegmental right upper lobe pulmonary emboli.
Overall clot burden is very small.

Patchy multifocal opacities throughout all lobes, suspicious for
multifocal pneumonia.

Critical Value/emergent results were called by telephone at the
time of interpretation on [DATE]  at [SM] hours  to  Dr. TRANG
TRANG, who verbally acknowledged these results.
<!--  IDXRADR:ADDEND:INNER_END -->Addendum Ends
<!--  IDXRADR:ADDEND:END -->*RADIOLOGY REPORT*
FINDINGS: Suspected small subsegmental filling defects within
branches of the right upper lobe pulmonary artery (series 5/images
97 and 105).  If this is a true finding, the overall clot burden is
very small.

Patchy/nodular multifocal opacities throughout all lobes,
suspicious for multifocal pneumonia.  Interstitial edema is
considered unlikely given the nodular appearance.  No pleural
effusion or pneumothorax.

Visualized thyroid is unremarkable.

2.5 x 2.6 cm fluid density lesion in the anterior mediastinum,
likely reflecting a pericardial cyst, similar to [SM].

Small mediastinal lymph nodes measuring up to 11 mm short axis
(series 4/image 33), likely reactive.  No suspicious hilar or
axillary lymphadenopathy.

Visualized upper abdomen is unremarkable.

Nonobstructive bowel gas pattern.
IMPRESSION: Suspected small subsegmental right upper lobe pulmonary emboli.
Overall clot burden is very small.

Patchy multifocal opacities throughout all lobes, suspicious for
multifocal pneumonia.

Critical Value/emergent results were called by telephone at the
time of interpretation on [DATE]  at [SM] hours  to  Dr. TRANG
TRANG, who verbally acknowledged these results.

## 2012-02-08 IMAGING — CR DG CHEST 1V PORT
1 series · 1 of 1 positions shown · non-contrast
Comparison: [DATE]

CLINICAL DATA: Chest pain, shortness of breath

PORTABLE CHEST - 1 VIEW

[AP]
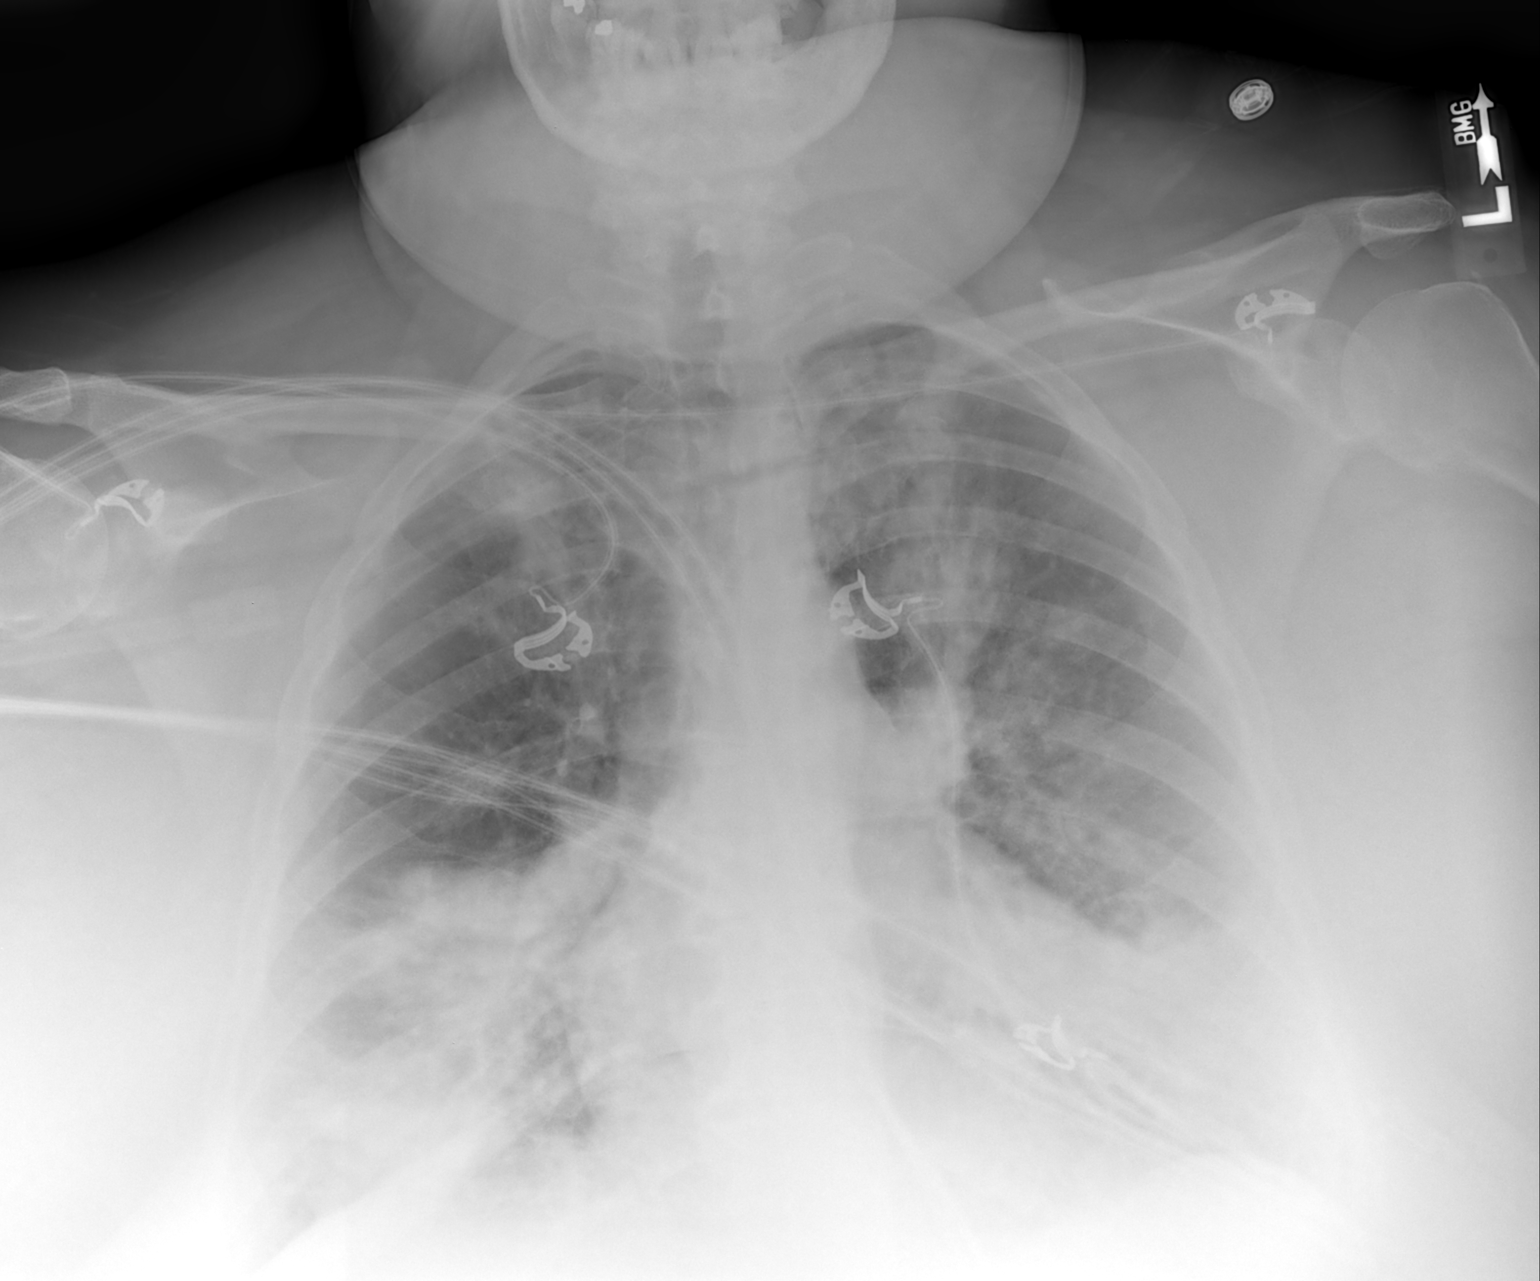

[1 of 1 positions shown; findings below may reference images not displayed]

FINDINGS: Study is limited by patient's large body habitus.
Cardiomegaly is noted.  There is interstitial prominence and patchy
airspace disease bilateral upper lobe medially.  There is airspace
disease in lower lobe right greater than left.  The findings may be
due to asymmetric pneumonia or asymmetric pulmonary edema.
Clinical correlation is necessary.
IMPRESSION: There is interstitial prominence and patchy airspace disease
bilateral upper lobe medially.  There is airspace disease in lower
lobe right greater than left.  The findings may be due to
asymmetric pneumonia or asymmetric pulmonary edema.
Clinical correlation is necessary.

## 2012-02-08 MED ORDER — GLIPIZIDE 5 MG PO TABS
5.0000 mg | ORAL_TABLET | Freq: Two times a day (BID) | ORAL | Status: DC
  Administered 2012-02-10 – 2012-02-19 (×19): 5 mg via ORAL
  Filled 2012-02-08 (×23): qty 1

## 2012-02-08 MED ORDER — ENOXAPARIN SODIUM 150 MG/ML ~~LOC~~ SOLN
1.0000 mg/kg | Freq: Two times a day (BID) | SUBCUTANEOUS | Status: DC
  Administered 2012-02-08: 160 mg via SUBCUTANEOUS
  Filled 2012-02-08 (×4): qty 2

## 2012-02-08 MED ORDER — WARFARIN SODIUM 7.5 MG PO TABS
7.5000 mg | ORAL_TABLET | Freq: Once | ORAL | Status: AC
  Administered 2012-02-08: 7.5 mg via ORAL
  Filled 2012-02-08: qty 1

## 2012-02-08 MED ORDER — ACETAMINOPHEN 325 MG PO TABS
650.0000 mg | ORAL_TABLET | Freq: Four times a day (QID) | ORAL | Status: DC | PRN
  Administered 2012-02-11 – 2012-02-13 (×4): 650 mg via ORAL
  Filled 2012-02-08 (×4): qty 2

## 2012-02-08 MED ORDER — PANTOPRAZOLE SODIUM 40 MG PO TBEC
40.0000 mg | DELAYED_RELEASE_TABLET | Freq: Every morning | ORAL | Status: DC
  Administered 2012-02-08 – 2012-02-19 (×12): 40 mg via ORAL
  Filled 2012-02-08 (×12): qty 1

## 2012-02-08 MED ORDER — IOHEXOL 300 MG/ML  SOLN
100.0000 mL | Freq: Once | INTRAMUSCULAR | Status: AC | PRN
  Administered 2012-02-08: 100 mL via INTRAVENOUS

## 2012-02-08 MED ORDER — WARFARIN VIDEO
Freq: Once | Status: AC
  Administered 2012-02-09: 12:00:00

## 2012-02-08 MED ORDER — GABAPENTIN 300 MG PO CAPS
300.0000 mg | ORAL_CAPSULE | Freq: Every day | ORAL | Status: DC
  Administered 2012-02-08 – 2012-02-19 (×12): 300 mg via ORAL
  Filled 2012-02-08 (×12): qty 1

## 2012-02-08 MED ORDER — FLUTICASONE-SALMETEROL 250-50 MCG/DOSE IN AEPB
1.0000 | INHALATION_SPRAY | Freq: Two times a day (BID) | RESPIRATORY_TRACT | Status: DC
  Administered 2012-02-08 – 2012-02-19 (×23): 1 via RESPIRATORY_TRACT
  Filled 2012-02-08 (×3): qty 14

## 2012-02-08 MED ORDER — DIPHENHYDRAMINE HCL 25 MG PO CAPS
25.0000 mg | ORAL_CAPSULE | Freq: Every evening | ORAL | Status: DC | PRN
  Administered 2012-02-09: 25 mg via ORAL
  Filled 2012-02-08: qty 1

## 2012-02-08 MED ORDER — CLOPIDOGREL BISULFATE 75 MG PO TABS
75.0000 mg | ORAL_TABLET | Freq: Every day | ORAL | Status: DC
  Administered 2012-02-08 – 2012-02-19 (×12): 75 mg via ORAL
  Filled 2012-02-08 (×12): qty 1

## 2012-02-08 MED ORDER — VANCOMYCIN HCL IN DEXTROSE 1-5 GM/200ML-% IV SOLN
1000.0000 mg | INTRAVENOUS | Status: AC
  Filled 2012-02-08: qty 200

## 2012-02-08 MED ORDER — VANCOMYCIN HCL IN DEXTROSE 1-5 GM/200ML-% IV SOLN
1000.0000 mg | INTRAVENOUS | Status: AC
  Administered 2012-02-08: 1000 mg via INTRAVENOUS
  Filled 2012-02-08: qty 200

## 2012-02-08 MED ORDER — PATIENT'S GUIDE TO USING COUMADIN BOOK
Freq: Once | Status: AC
  Administered 2012-02-09: 10:00:00
  Filled 2012-02-08: qty 1

## 2012-02-08 MED ORDER — ONDANSETRON HCL 4 MG PO TABS: 4.0000 mg | ORAL_TABLET | Freq: Four times a day (QID) | ORAL | Status: DC | PRN

## 2012-02-08 MED ORDER — LOSARTAN POTASSIUM 50 MG PO TABS
100.0000 mg | ORAL_TABLET | Freq: Every day | ORAL | Status: DC
  Administered 2012-02-08 – 2012-02-19 (×12): 100 mg via ORAL
  Filled 2012-02-08 (×12): qty 2

## 2012-02-08 MED ORDER — IBUPROFEN 200 MG PO TABS
200.0000 mg | ORAL_TABLET | Freq: Four times a day (QID) | ORAL | Status: DC | PRN
  Filled 2012-02-08: qty 1

## 2012-02-08 MED ORDER — SENNA 8.6 MG PO TABS
1.0000 | ORAL_TABLET | Freq: Two times a day (BID) | ORAL | Status: DC
  Administered 2012-02-08 – 2012-02-19 (×18): 8.6 mg via ORAL
  Filled 2012-02-08 (×20): qty 1

## 2012-02-08 MED ORDER — ACETAMINOPHEN 650 MG RE SUPP: 650.0000 mg | Freq: Four times a day (QID) | RECTAL | Status: DC | PRN

## 2012-02-08 MED ORDER — LEVALBUTEROL HCL 0.63 MG/3ML IN NEBU
0.6300 mg | INHALATION_SOLUTION | RESPIRATORY_TRACT | Status: DC | PRN
  Administered 2012-02-08: 0.63 mg via RESPIRATORY_TRACT
  Filled 2012-02-08: qty 3

## 2012-02-08 MED ORDER — PIPERACILLIN-TAZOBACTAM 3.375 G IVPB
3.3750 g | INTRAVENOUS | Status: AC
  Administered 2012-02-08: 3.375 g via INTRAVENOUS
  Filled 2012-02-08 (×2): qty 50

## 2012-02-08 MED ORDER — ALBUTEROL SULFATE HFA 108 (90 BASE) MCG/ACT IN AERS
1.0000 | INHALATION_SPRAY | Freq: Four times a day (QID) | RESPIRATORY_TRACT | Status: DC | PRN
  Filled 2012-02-08: qty 6.7

## 2012-02-08 MED ORDER — ESCITALOPRAM OXALATE 20 MG PO TABS
20.0000 mg | ORAL_TABLET | Freq: Every day | ORAL | Status: DC
  Administered 2012-02-08 – 2012-02-19 (×12): 20 mg via ORAL
  Filled 2012-02-08: qty 2
  Filled 2012-02-08 (×11): qty 1

## 2012-02-08 MED ORDER — FUROSEMIDE 10 MG/ML IJ SOLN
40.0000 mg | Freq: Once | INTRAMUSCULAR | Status: AC
  Administered 2012-02-08: 40 mg via INTRAVENOUS
  Filled 2012-02-08: qty 4

## 2012-02-08 MED ORDER — DEXTROSE 5 % IV SOLN
1.0000 g | Freq: Three times a day (TID) | INTRAVENOUS | Status: AC
  Administered 2012-02-08 – 2012-02-16 (×26): 1 g via INTRAVENOUS
  Filled 2012-02-08 (×27): qty 1

## 2012-02-08 MED ORDER — INSULIN ASPART 100 UNIT/ML ~~LOC~~ SOLN
0.0000 [IU] | Freq: Three times a day (TID) | SUBCUTANEOUS | Status: DC
  Administered 2012-02-09 – 2012-02-16 (×9): 1 [IU] via SUBCUTANEOUS
  Administered 2012-02-16: 2 [IU] via SUBCUTANEOUS
  Administered 2012-02-17: 3 [IU] via SUBCUTANEOUS
  Administered 2012-02-17 – 2012-02-18 (×3): 1 [IU] via SUBCUTANEOUS

## 2012-02-08 MED ORDER — DM-GUAIFENESIN ER 30-600 MG PO TB12
1.0000 | ORAL_TABLET | Freq: Two times a day (BID) | ORAL | Status: DC
  Administered 2012-02-08 – 2012-02-19 (×23): 1 via ORAL
  Filled 2012-02-08 (×25): qty 1

## 2012-02-08 MED ORDER — INSULIN ASPART 100 UNIT/ML ~~LOC~~ SOLN
0.0000 [IU] | Freq: Every day | SUBCUTANEOUS | Status: DC
  Administered 2012-02-15: 2 [IU] via SUBCUTANEOUS
  Filled 2012-02-08: qty 3

## 2012-02-08 MED ORDER — POTASSIUM CHLORIDE CRYS ER 20 MEQ PO TBCR
40.0000 meq | EXTENDED_RELEASE_TABLET | Freq: Once | ORAL | Status: AC
  Administered 2012-02-08: 40 meq via ORAL
  Filled 2012-02-08: qty 2

## 2012-02-08 MED ORDER — ONDANSETRON HCL 4 MG/2ML IJ SOLN: 4.0000 mg | Freq: Four times a day (QID) | INTRAMUSCULAR | Status: DC | PRN

## 2012-02-08 MED ORDER — OXYCODONE-ACETAMINOPHEN 5-325 MG PO TABS
1.0000 | ORAL_TABLET | ORAL | Status: DC | PRN
  Administered 2012-02-09 – 2012-02-13 (×12): 2 via ORAL
  Administered 2012-02-14: 1 via ORAL
  Administered 2012-02-14 – 2012-02-19 (×11): 2 via ORAL
  Filled 2012-02-08 (×24): qty 2

## 2012-02-08 MED ORDER — ROSUVASTATIN CALCIUM 20 MG PO TABS
20.0000 mg | ORAL_TABLET | Freq: Every day | ORAL | Status: DC
  Administered 2012-02-08 – 2012-02-19 (×12): 20 mg via ORAL
  Filled 2012-02-08 (×12): qty 1

## 2012-02-08 MED ORDER — LOSARTAN POTASSIUM 50 MG PO TABS: 100.0000 mg | ORAL_TABLET | Freq: Every day | ORAL | Status: DC

## 2012-02-08 MED ORDER — SODIUM CHLORIDE 0.9 % IV SOLN
INTRAVENOUS | Status: AC
  Administered 2012-02-08: 12:00:00 via INTRAVENOUS

## 2012-02-08 MED ORDER — LEVOFLOXACIN IN D5W 750 MG/150ML IV SOLN
750.0000 mg | INTRAVENOUS | Status: DC
  Administered 2012-02-08 – 2012-02-10 (×3): 750 mg via INTRAVENOUS
  Filled 2012-02-08 (×4): qty 150

## 2012-02-08 MED ORDER — METHOCARBAMOL 500 MG PO TABS
500.0000 mg | ORAL_TABLET | Freq: Four times a day (QID) | ORAL | Status: DC | PRN
  Administered 2012-02-09 – 2012-02-16 (×9): 500 mg via ORAL
  Filled 2012-02-08 (×10): qty 1

## 2012-02-08 MED ORDER — HYDROCHLOROTHIAZIDE 25 MG PO TABS
25.0000 mg | ORAL_TABLET | Freq: Every day | ORAL | Status: DC
  Administered 2012-02-08 – 2012-02-11 (×4): 25 mg via ORAL
  Filled 2012-02-08 (×6): qty 1

## 2012-02-08 MED ORDER — VANCOMYCIN HCL 1000 MG IV SOLR
1500.0000 mg | Freq: Two times a day (BID) | INTRAVENOUS | Status: DC
  Administered 2012-02-09 – 2012-02-11 (×5): 1500 mg via INTRAVENOUS
  Filled 2012-02-08 (×7): qty 1500

## 2012-02-08 MED ORDER — POTASSIUM CHLORIDE CRYS ER 20 MEQ PO TBCR
40.0000 meq | EXTENDED_RELEASE_TABLET | ORAL | Status: AC
  Administered 2012-02-08 (×2): 40 meq via ORAL
  Filled 2012-02-08 (×2): qty 2

## 2012-02-08 NOTE — Progress Notes (Addendum)
ANTICOAGULATION CONSULT NOTE - Initial Consult  Pharmacy Consult for: Lovenox/Vancomycin (Cefepime/levaquin per MD) Indication: New PE/HCAP  Allergies  Allergen Reactions  . Hydrocodone     itching    Patient Measurements: Weight: 358 lb (162.388 kg)   Vital Signs: Temp: 98.7 F (37.1 C) (02/10 0940) Temp src: Oral (02/10 0940) BP: 160/71 mmHg (02/10 0941) Pulse Rate: 107  (02/10 0941)  Labs:  Basename 02/08/12 1017 02/08/12 1000 02/06/12 0530  HGB 8.5* 8.5* --  HCT 25.0* 25.9* 24.6*  PLT -- 272 182  APTT -- -- --  LABPROT -- -- --  INR -- -- --  HEPARINUNFRC -- -- --  CREATININE 0.90 -- --  CKTOTAL -- -- --  CKMB -- -- --  TROPONINI -- <0.30 --   The CrCl is unknown because both a height and weight (above a minimum accepted value) are required for this calculation.  Medical History: Past Medical History  Diagnosis Date  . Asthma   . Hypertension   . CAD (coronary artery disease)     Cath 2010 with DES x 1 RCA  . Hyperlipidemia   . GERD (gastroesophageal reflux disease)   . Depression   . Diabetes mellitus 2010  . Arthritis     knees  . Abnormal uterine bleeding     last menstrual period 3 yrs ago.to be evalauated for uterine polyp  . Sleep apnea     never had sleep study  . Aortic stenosis, moderate     per 11/2011 echo    Medications:  Scheduled:    . enoxaparin (LOVENOX) injection  1 mg/kg Subcutaneous Q12H  . piperacillin-tazobactam (ZOSYN)  IV  3.375 g Intravenous To ER  . vancomycin  1,000 mg Intravenous To ER   Infusions:   PRN: iohexol  Assessment:  PE  51 yo obese F with HCAP and + PE to start lovenox 1mg /kg q12h and warfarin.  Pt was recently discharged(2/8) after undergoing a TKA on 2/5 and now presents with HCAP and a new PE on CT.   Weight = 162.4 kg  CBC okay, hgb 8.5 noted (stable compared to post op hgb)  Renal function WNL  INR on 2/5 was 0.94  DI: Levaquin/coumadin (Can cause INR to increase)  HCAP  CXR/CT  consistant with PNA, treat as HCAP given recent discharge from hospital.  WBC slightly elevated at 11.0, Afebrile.  Scr 0.9 and stable with CrCl (Norm) 93 ml/min.    No current cultures  Goal of Therapy:  Lovenox 1mg /kg based on renal function Vancomycin trough 15-20   Plan:  1.) Lovenox 160 mg SQ q12h, first dose stat 2.) CBC q72h, Daily INR 3.) Monitor low Hgb and renal function 4.) Coumadin 7.5 mg po x 1 now 5.) Coumadin book/video 6.) Vancomycin 1 gm now to = 2 gm load today (first dose given in ER) 7.) Vancomycin 1500 mg IV q12h 8.) Monitor scr and check vancomycin trough at Css  Mckyla Deckman, Loma Messing PharmD 3:23 PM 02/08/2012

## 2012-02-08 NOTE — ED Notes (Signed)
U/S at bedside, report called to 4W.

## 2012-02-08 NOTE — ED Notes (Signed)
NWG:[id number]<ZH> Expected date:02/08/12<BR> Expected time: 9:10 AM<BR> Means of arrival:Ambulance<BR> Comments:<BR> EMS Weakness

## 2012-02-08 NOTE — ED Notes (Signed)
Pt becomes extremely short of breath with movement. Audible wheezing noted RT called for PRN neb

## 2012-02-08 NOTE — ED Notes (Signed)
Per EMS: patient with recent visit to hospital (tuesday- Friday of last week for knee replacement) here with c/o shob. 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 VSS. Dyspnea at rest, hx of diabetes, htn, MI.

## 2012-02-08 NOTE — Progress Notes (Signed)
VASCULAR LAB PRELIMINARY  PRELIMINARY  PRELIMINARY  PRELIMINARY  Bilateral lower extremity venous Dopplers completed.    Preliminary report:  There is no obvious evidence of DVT or SVT noted in the bilateral lower extremities.  Sherren Kerns [place], 02/08/2012, 5:48 PM

## 2012-02-08 NOTE — Progress Notes (Signed)
[facility] hospitalist progress note. Chief complaint. Mildly elevated troponin. History of present illness. This 51 year old female admitted with a pulmonary emboli her CT angiogram. This in light of recent right total knee arthroplasty. Also hospital-acquired pneumonia. Initiated on broad-spectrum antibiotic therapy. She is also noted that with an elevated BNP greater than 1000. This suggestive of possible degree of congestive heart failure. Nonetheless she has been chest pain-free since her admission. She had an initial set of cardiac enzymes today at 14:49 showing CK 196, MB 4.0, troponin less than 0.3. A repeat set of cardiac enzymes obtained today at 20:30 to show CK 202, MB 3.4, and troponin mildly elevated at 0.35. Of note patient specifically denies any sort of chest pain. Impression/plan. Problem #1 mildly elevated troponin. This in light of patient's denial of any chest pain. Again she is positive for pulmonary emboli and I suspect this likely the reason for her mild elevation in troponin. I do not see any further followup cardiac enzymes and I've ordered 2 more sets every 6 hours. If her troponin is showing a climbing pattern then I would certainly consider contacting cardiology. She is currently on full dose Lovenox. I will repeat a 12-lead EKG in the a.m. today. Also we'll follow for her further cardiac enzymes. I will request an echocardiogram to evaluate for any right heart strain.

## 2012-02-08 NOTE — ED Notes (Signed)
Phlebotomy and pharmacy at bedside. 

## 2012-02-08 NOTE — Progress Notes (Signed)
CRITICAL VALUE ALERT  Critical value received:  Troponin 0.35  Date of notification:  02/08/2012  Time of notification:  2145  Critical value read back:yes  Nurse who received alert:  L. Maryjo Rochester  MD notified (1st page):  Lenny Pastel NP  Time of first page:  2200  MD notified (2nd page):  Time of second page:  Responding MD:  Lenny Pastel NP  Time MD responded:  [phone]

## 2012-02-08 NOTE — ED Notes (Signed)
Admitting MD at bedside.

## 2012-02-08 NOTE — H&P (Signed)
PCP:   Ron Parker, MD, MD   Chief Complaint:  Worsening shortness of breath and cough/hemoptysis  HPI: The patient is a 51 year old morbidly obese white female recently discharged from the hospital on 02/05/2011 status post of right total knee arthroplasty per Dr. Magnus Ivan for degenerative arthritis of the knee, a local presents with above complaints. She states that the day after she was discharged she developed shortness of breath that was worse with exertion as well as a cough. She reports that the shortness of breath was worse with exertion and worsened over the next day and initially her cough was mostly nonproductive  but subsequently when she was able to cough up anything it had blood streaks. She admits to subjective fevers. Because of her worsening shortness of breath and hemoptysis she came to the ED today. She denies chest pain, orthopnea, melena, hematochezia and no dysuria. She was seen in the ED and a CT angiogram of her chest are was positive for small subsegmental right upper lobe pulmonary emboli with overall is small clot burden, and the patchy multifocal opacities throughout all lobes suspicious for multifocal pneumonia. A brain natruretic peptide was done and was elevated at 1112. Her hemoglobin was noted to be 8.5 but this was noted to be stable compared to her postoperative hemoglobins. She was started on empiric antibiotics for hospital-acquired pneumonia and full dose Lovenox for the PE and is admitted for further evaluation and management.  Review of Systems:  The patient denies anorexia, fever, weight loss,, vision loss, decreased hearing, hoarseness, chest pain, syncope, exertion, , balance deficits, abdominal pain, melena, hematochezia, severe indigestion/heartburn, hematuria, incontinence, genital sores, muscle weakness, transient blindness, difficulty walking, depression, unusual weight change.  Past Medical History: Past Medical History  Diagnosis Date  . Asthma    . Hypertension   . CAD (coronary artery disease)     Cath 2010 with DES x 1 RCA  . Hyperlipidemia   . GERD (gastroesophageal reflux disease)   . Depression   . Diabetes mellitus 2010  . Arthritis     knees  . Abnormal uterine bleeding     last menstrual period 3 yrs ago.to be evalauated for uterine polyp  . Sleep apnea     never had sleep study  . Aortic stenosis, moderate     per 11/2011 echo   Past Surgical History  Procedure Date  . Tubal ligation   . Carpal tunnel release     Bilateral  . Cholecystectomy   . Hernia repair   . Cardiac catheterization   . Coronary angioplasty     2010  . Knee arthroplasty 02/03/2012    Procedure: COMPUTER ASSISTED TOTAL KNEE ARTHROPLASTY;  Surgeon: [name], MD;  Location: [facility] OR;  Service: Orthopedics;  Laterality: Right;  Right total knee arthroplasty    Medications: Prior to Admission medications   Medication Sig Start Date End Date Taking? Authorizing Provider  albuterol (PROVENTIL HFA;VENTOLIN HFA) 108 (90 BASE) MCG/ACT inhaler Inhale 1 puff into the lungs every 6 (six) hours as needed. Shortness of breath.    Yes Historical Provider, MD  atorvastatin (LIPITOR) 40 MG tablet Take 40 mg by mouth at bedtime.    Yes Historical Provider, MD  clopidogrel (PLAVIX) 75 MG tablet Take 75 mg by mouth daily.     Yes Historical Provider, MD  diclofenac (VOLTAREN) 25 MG EC tablet Take 25 mg by mouth 2 (two) times daily.     Yes Historical Provider, MD  escitalopram (LEXAPRO)  20 MG tablet Take 20 mg by mouth daily.     Yes Historical Provider, MD  Fluticasone-Salmeterol (ADVAIR) 250-50 MCG/DOSE AEPB Inhale 1 puff into the lungs 2 (two) times daily.    Yes Historical Provider, MD  gabapentin (NEURONTIN) 300 MG capsule Take 300 mg by mouth daily. Patient states she takes it once daily. Per patient.    Yes Historical Provider, MD  glipiZIDE (GLUCOTROL) 5 MG tablet Take 5 mg by mouth 2 (two) times daily before a meal.     Yes Historical  Provider, MD  hydrochlorothiazide (HYDRODIURIL) 25 MG tablet Take 25 mg by mouth daily.   Yes Historical Provider, MD  ibuprofen (ADVIL,MOTRIN) 200 MG tablet Take 200 mg by mouth every 6 (six) hours as needed. For pain   Yes Historical Provider, MD  losartan (COZAAR) 100 MG tablet Take 100 mg by mouth daily.   Yes Historical Provider, MD  methocarbamol (ROBAXIN) 500 MG tablet Take 1 tablet (500 mg total) by mouth every 6 (six) hours as needed. 02/06/12 02/16/12 Yes [name], MD  oxyCODONE-acetaminophen (ROXICET) 5-325 MG per tablet Take 1-2 tablets by mouth every 4 (four) hours as needed for pain. 02/06/12 02/16/12 Yes [name], MD    Allergies:   Allergies  Allergen Reactions  . Hydrocodone     itching    Social History:  reports that she has quit smoking. Her smoking use included Cigarettes. She has a 45 pack-year smoking history. She does not have any smokeless tobacco history on file. She reports that she does not drink alcohol or use illicit drugs.  Family History: Family History  Problem Relation Age of Onset  . Cancer Mother     Breast  . COPD Father   . Cancer Brother     Sinus    Physical Exam: Filed Vitals:   02/08/12 0941 02/08/12 0948 02/08/12 0949 02/08/12 1356  BP: 160/71   134/92  Pulse: 107   102  Temp:      TempSrc:      Resp:    32  Weight:  162.388 kg (358 lb)    SpO2: 100%  92% 98%   Constitutional: Vital signs reviewed. Morbidly obese , tachypneic, in no respiratory distress with nasal cannula oxygen on.  and cooperative with exam. Alert and oriented x3.  Head: Normocephalic and atraumatic Mouth: no erythema or exudates, MMM Eyes: PERRL, EOMI, conjunctivae normal, No scleral icterus.  Neck: Supple, Trachea midline normal ROM, No JVD, mass, thyromegaly, or carotid bruit present.  Cardiovascular: RRR, S1 normal, S2 normal, no MRG, pulses symmetric and intact bilaterally Pulmonary/Chest: CTAB, no wheezes, rales, or  rhonchi Abdominal: Soft. Obese, Non-tender, non-distended, bowel sounds are normal, no masses, organomegaly, or guarding present.   Extremity: +1 right lower extremity edema, no cyanosis Neurological: A&O x3, Strenght is normal and symmetric bilaterally, cranial nerve II-XII are grossly intact, no focal motor deficit, sensory intact to light touch bilaterally.  Skin: Warm, dry and intact. No rash, cyanosis, or clubbing.  Psychiatric: Normal mood and affect. speech and behavior is normal. Judgment and thought content normal. Cognition and memory are normal.      Labs on Admission:   Basename 02/08/12 1017  NA 137  K 3.2*  CL 96  CO2 --  GLUCOSE 155*  BUN 13  CREATININE 0.90  CALCIUM --  MG --  PHOS --   No results found for this basename: AST:2,ALT:2,ALKPHOS:2,BILITOT:2,PROT:2,ALBUMIN:2 in the last 72 hours No results found for this basename: LIPASE:2,AMYLASE:2  in the last 72 hours  Basename 02/08/12 1017 02/08/12 1000 02/06/12 0530  WBC -- 11.0* 9.3  NEUTROABS -- -- --  HGB 8.5* 8.5* --  HCT 25.0* 25.9* --  MCV -- 82.5 84.0  PLT -- 272 182    Basename 02/08/12 1000  CKTOTAL --  CKMB --  CKMBINDEX --  TROPONINI <0.30   No results found for this basename: TSH,T4TOTAL,FREET3,T3FREE,THYROIDAB in the last 72 hours No results found for this basename: VITAMINB12:2,FOLATE:2,FERRITIN:2,TIBC:2,IRON:2,RETICCTPCT:2 in the last 72 hours  Radiological Exams on Admission: Dg Chest 2 View  01/29/2012  *RADIOLOGY REPORT*  Clinical Data: Preop for right total knee replacement.  History of hypertension and coronary artery disease.  CHEST - 2 VIEW  Comparison: Chest x-ray 11/19/2011.  Findings: Lung volumes are normal.  No consolidative airspace disease.  No pleural effusions.  No pneumothorax.  No pulmonary nodule or mass noted.  Pulmonary vasculature and the cardiomediastinal silhouette are within normal limits. Atherosclerotic calcifications in the arch of the aorta.  Prominent  pericardial fat pad again noted.  IMPRESSION: 1. No radiographic evidence of acute cardiopulmonary disease. 2.  Atherosclerosis.  Original Report Authenticated By: Florencia Reasons, M.D.   Ct Angio Chest W/cm &/or Wo Cm  02/08/2012  **ADDENDUM** CREATED: 02/08/2012 12:52:14  As noted in the body of the report, the primary differential consideration for the bilateral pulmonary opacities is interstitial edema.  Infection remains favored due to lack of pleural effusions or cardiomegaly.  **END ADDENDUM** SIGNED BY: Charline Bills, M.D.    02/08/2012  *RADIOLOGY REPORT*  Clinical Data: Shortness of breath, status post total knee replacement, evaluate for PE  CT ANGIOGRAPHY CHEST  Technique:  Multidetector CT imaging of the chest using the standard protocol during bolus administration of intravenous contrast. Multiplanar reconstructed images including MIPs were obtained and reviewed to evaluate the vascular anatomy.  Contrast: OMNIPAQUE IOHEXOL 300 MG/ML IV SOLN  Comparison: Chest radiograph dated 02/08/2012, CT chest dated 02/09/2009  Findings: Suspected small subsegmental filling defects within branches of the right upper lobe pulmonary artery (series 5/images 97 and 105).  If this is a true finding, the overall clot burden is very small.  Patchy/nodular multifocal opacities throughout all lobes, suspicious for multifocal pneumonia.  Interstitial edema is considered unlikely given the nodular appearance.  No pleural effusion or pneumothorax.  Visualized thyroid is unremarkable.  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  Small mediastinal lymph nodes measuring up to 11 mm short axis (series 4/image 33), likely reactive.  No suspicious hilar or axillary lymphadenopathy.  Visualized upper abdomen is unremarkable.  Nonobstructive bowel gas pattern.  IMPRESSION: Suspected small subsegmental right upper lobe pulmonary emboli. Overall  clot burden is very small.  Patchy multifocal opacities throughout all lobes, suspicious for multifocal pneumonia.  Critical Value/emergent results were called by telephone at the time of interpretation on 02/08/2012  at 1100 hours  to  Dr. Doug Sou, who verbally acknowledged these results. Original Report Authenticated By: Charline Bills, M.D.   Dg Chest Port 1 View  02/08/2012  *RADIOLOGY REPORT*  Clinical Data: Chest pain, shortness of breath  PORTABLE CHEST - 1 VIEW  Comparison: 01/28/1929  Findings: Study is limited by patient's large body habitus. Cardiomegaly is noted.  There is interstitial prominence and patchy airspace disease bilateral upper lobe medially.  There is airspace disease in lower lobe right greater than left.  The findings may be due to asymmetric pneumonia  or asymmetric pulmonary edema. Clinical correlation is necessary.  IMPRESSION:  There is interstitial prominence and patchy airspace disease bilateral upper lobe medially.  There is airspace disease in lower lobe right greater than left.  The findings may be due to asymmetric pneumonia or asymmetric pulmonary edema. Clinical correlation is necessary.  Original Report Authenticated By: Natasha Mead, M.D.   X-ray Knee Right Port  02/03/2012  *RADIOLOGY REPORT*  Clinical Data: Post right knee arthroplasty  PORTABLE RIGHT KNEE - 1-2 VIEW  Comparison: Portable exam 1647 hours compared to 01/08/2008  Findings: Components of right knee prosthesis in expected positions. Scattered soft tissue swelling. Surgical drain and skin clips. No dislocation seen. Tiny bone fragment identified at the medial margin of the proximal tibia compatible with tiny fracture fragment. No definite fracture plane is seen traversing the tibial metaphysis. Uncertain whether this traverses to the medial margin of the tibia, medial to the tibial plateau component.  IMPRESSION: Components of right knee prosthesis in expected position. Small bone fragment identified  at medial margin of proximal medial metaphysis question tiny avulsion fracture though cannot exclude extension to the medial margin of the proximal tibia.  Original Report Authenticated By: Lollie Marrow, M.D.    Assessment/Plan Present on Admission:  .PE (pulmonary embolism) As discussed above, morbidly obese patient status post recent R. total knee arthroplasty presenting with the worsening shortness of breath and CT angiogram with a small PE. Will obtain Dopplers of lower extremities to evaluate for DVT. Arm patient started on full dose Lovenox, follow and start Coumadin.  Marland KitchenHAP (hospital-acquired pneumonia) -Empiric antibiotics with cefepime Levaquin and vancomycin.she is afebrile and hemodynamically stable, with mild leukocytosis follow and obtain blood cultures if febrile/hemodynamic instability.  .Anemia -Hemoglobin stable compared to her postop number prior to discharge. Follow on anticoagulation above.  .Hypertension -Continue outpatient medications  .GERD (gastroesophageal reflux disease) -Will place on PPI  .DM (diabetes mellitus) -Continue oral hypoglycemics, monitor Accu-Cheks and cover with sliding scale insulin.  .Asthma -Stable, continue bronchodilators  .Hypokalemia  replace potassium. Marland KitchenHistory of coronary artery disease -She was chest pain-free, continue outpatient medications. . Status post recent right total knee arthroplasty -Notified Dr. Eliberto Ivory service of patient's admission (Dr. Lajoyce Corners on call). Maddilynn Esperanza C 02/08/2012, 2:00 PM  [phone]

## 2012-02-08 NOTE — ED Provider Notes (Addendum)
History     CSN: [id number]  Arrival date & time 02/08/12  1191   First MD Initiated Contact with Patient 02/08/12 [phone]      Chief Complaint  Patient presents with  . Shortness of Breath    (Consider location/radiation/quality/duration/timing/severity/associated sxs/prior treatment) HPI Planes of shortness of breath gradual in onset yesterday no pain no fever admits to minimal cough treated with supplemental oxygen by EMS with relief denies other complaint. Patient is status post right-sided total knee replacement 2 days ago. Nothing makes symptoms better or worse Past Medical History  Diagnosis Date  . Asthma   . Hypertension   . CAD (coronary artery disease)     Cath 2010 with DES x 1 RCA  . Hyperlipidemia   . GERD (gastroesophageal reflux disease)   . Depression   . Diabetes mellitus 2010  . Arthritis     knees  . Abnormal uterine bleeding     last menstrual period 3 yrs ago.to be evalauated for uterine polyp  . Sleep apnea     never had sleep study  . Aortic stenosis, moderate     per 11/2011 echo    Past Surgical History  Procedure Date  . Tubal ligation   . Carpal tunnel release     Bilateral  . Cholecystectomy   . Hernia repair   . Cardiac catheterization   . Coronary angioplasty     2010  . Knee arthroplasty 02/03/2012    Procedure: COMPUTER ASSISTED TOTAL KNEE ARTHROPLASTY;  Surgeon: [name], MD;  Location: [facility] OR;  Service: Orthopedics;  Laterality: Right;  Right total knee arthroplasty    Family History  Problem Relation Age of Onset  . Cancer Mother     Breast  . COPD Father   . Cancer Brother     Sinus    History  Substance Use Topics  . Smoking status: Former Smoker -- 1.5 packs/day for 30 years    Types: Cigarettes  . Smokeless tobacco: Not on file   Comment:  quit 12 -13 years ago  . Alcohol Use: No    OB History    Grav Para Term Preterm Abortions TAB SAB Ect Mult Living                  Review of Systems    Constitutional: Negative.   HENT: Negative.   Respiratory: Positive for shortness of breath.   Cardiovascular: Negative.   Gastrointestinal: Negative.   Musculoskeletal: Negative.   Skin: Negative.   Neurological: Negative.   Hematological: Negative.   Psychiatric/Behavioral: Negative.   All other systems reviewed and are negative.    Allergies  Hydrocodone  Home Medications   Current Outpatient Rx  Name Route Sig Dispense Refill  . ALBUTEROL SULFATE HFA 108 (90 BASE) MCG/ACT IN AERS Inhalation Inhale 1 puff into the lungs every 6 (six) hours as needed. Shortness of breath.     . ATORVASTATIN CALCIUM 40 MG PO TABS Oral Take 40 mg by mouth at bedtime.     . CLOPIDOGREL BISULFATE 75 MG PO TABS Oral Take 75 mg by mouth daily.      Marland Kitchen DICLOFENAC SODIUM 25 MG PO TBEC Oral Take 25 mg by mouth 2 (two) times daily.      Marland Kitchen ESCITALOPRAM OXALATE 20 MG PO TABS Oral Take 20 mg by mouth daily.      Marland Kitchen FLUTICASONE-SALMETEROL 250-50 MCG/DOSE IN AEPB Inhalation Inhale 1 puff into the lungs 2 (two) times daily.     Marland Kitchen  GABAPENTIN 300 MG PO CAPS Oral Take 300 mg by mouth daily. Patient states she takes it once daily. Per patient.     Marland Kitchen GLIPIZIDE 5 MG PO TABS Oral Take 5 mg by mouth 2 (two) times daily before a meal.      . HYDROCHLOROTHIAZIDE 25 MG PO TABS Oral Take 25 mg by mouth daily.    . IBUPROFEN 200 MG PO TABS Oral Take 200 mg by mouth every 6 (six) hours as needed. For pain    . LOSARTAN POTASSIUM 100 MG PO TABS Oral Take 100 mg by mouth daily.    Marland Kitchen METHOCARBAMOL 500 MG PO TABS Oral Take 1 tablet (500 mg total) by mouth every 6 (six) hours as needed. 60 tablet 0  . OXYCODONE-ACETAMINOPHEN 5-325 MG PO TABS Oral Take 1-2 tablets by mouth every 4 (four) hours as needed for pain. 90 tablet 0    SpO2 100%  LMP 07/26/2009  Physical Exam  Nursing note and vitals reviewed. Constitutional: She appears well-developed and well-nourished.  HENT:  Head: Normocephalic and atraumatic.  Eyes:  Conjunctivae are normal. Pupils are equal, round, and reactive to light.  Neck: Neck supple. No tracheal deviation present. No thyromegaly present.  Cardiovascular: Normal rate and regular rhythm.   No murmur heard. Pulmonary/Chest: Breath sounds normal.       Appears in mild respiratory distress  Abdominal: Soft. Bowel sounds are normal. She exhibits no distension. There is no tenderness.       Morbidly obese  Musculoskeletal: Normal range of motion. She exhibits no edema and no tenderness.       Right lower extremity with bandaged wound over knee and shin with slight redness nontender neurovascular intact  Neurological: She is alert. Coordination normal.  Skin: Skin is warm and dry. No rash noted.  Psychiatric: She has a normal mood and affect.    Date: 02/08/2012  Rate: 99  Rhythm: normal sinus rhythm  QRS Axis: normal  Intervals: normal  ST/T Wave abnormalities: nonspecific ST/T changes  Conduction Disutrbances:none  Narrative Interpretation:   Old EKG Reviewed: unchanged No change from 01/03/2011  ED Course  Procedures (including critical care time) 1125 a.m. exam essentially unchanged patient appears mildly dyspneic speaks in paragraphs. Spoke with Dr.Viyouh, hospital admission arranged Labs Reviewed - No data to display No results found.   No diagnosis found.  Results for orders placed during the hospital encounter of 02/08/12  GLUCOSE, CAPILLARY      Component Value Range   Glucose-Capillary 172 (*) 70 - 99 (mg/dL)   Comment 1 Notify RN     Comment 2 Documented in Chart    CBC      Component Value Range   WBC 11.0 (*) 4.0 - 10.5 (K/uL)   RBC 3.14 (*) 3.87 - 5.11 (MIL/uL)   Hemoglobin 8.5 (*) 12.0 - 15.0 (g/dL)   HCT 40.9 (*) 81.1 - 46.0 (%)   MCV 82.5  78.0 - 100.0 (fL)   MCH 27.1  26.0 - 34.0 (pg)   MCHC 32.8  30.0 - 36.0 (g/dL)   RDW 91.4  78.2 - 95.6 (%)   Platelets 272  150 - 400 (K/uL)  TROPONIN I      Component Value Range   Troponin I <0.30  <0.30  (ng/mL)  PRO B NATRIURETIC PEPTIDE      Component Value Range   Pro B Natriuretic peptide (BNP) 1112.0 (*) 0 - 125 (pg/mL)  POCT I-STAT, CHEM 8      Component  Value Range   Sodium 137  135 - 145 (mEq/L)   Potassium 3.2 (*) 3.5 - 5.1 (mEq/L)   Chloride 96  96 - 112 (mEq/L)   BUN 13  6 - 23 (mg/dL)   Creatinine, Ser 1.61  0.50 - 1.10 (mg/dL)   Glucose, Bld 096 (*) 70 - 99 (mg/dL)   Calcium, Ion 0.45 (*) 1.12 - 1.32 (mmol/L)   TCO2 32  0 - 100 (mmol/L)   Hemoglobin 8.5 (*) 12.0 - 15.0 (g/dL)   HCT 40.9 (*) 81.1 - 46.0 (%)   Dg Chest 2 View  01/29/2012  *RADIOLOGY REPORT*  Clinical Data: Preop for right total knee replacement.  History of hypertension and coronary artery disease.  CHEST - 2 VIEW  Comparison: Chest x-ray 11/19/2011.  Findings: Lung volumes are normal.  No consolidative airspace disease.  No pleural effusions.  No pneumothorax.  No pulmonary nodule or mass noted.  Pulmonary vasculature and the cardiomediastinal silhouette are within normal limits. Atherosclerotic calcifications in the arch of the aorta.  Prominent pericardial fat pad again noted.  IMPRESSION: 1. No radiographic evidence of acute cardiopulmonary disease. 2.  Atherosclerosis.  Original Report Authenticated By: Florencia Reasons, M.D.   Ct Angio Chest W/cm &/or Wo Cm  02/08/2012  *RADIOLOGY REPORT*  Clinical Data: Shortness of breath, status post total knee replacement, evaluate for PE  CT ANGIOGRAPHY CHEST  Technique:  Multidetector CT imaging of the chest using the standard protocol during bolus administration of intravenous contrast. Multiplanar reconstructed images including MIPs were obtained and reviewed to evaluate the vascular anatomy.  Contrast: OMNIPAQUE IOHEXOL 300 MG/ML IV SOLN  Comparison: Chest radiograph dated 02/08/2012, CT chest dated 02/09/2009  Findings: Suspected small subsegmental filling defects within branches of the right upper lobe pulmonary artery (series 5/images 97 and 105).  If this is  a true finding, the overall clot burden is very small.  Patchy/nodular multifocal opacities throughout all lobes, suspicious for multifocal pneumonia.  Interstitial edema is considered unlikely given the nodular appearance.  No pleural effusion or pneumothorax.  Visualized thyroid is unremarkable.  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  Small mediastinal lymph nodes measuring up to 11 mm short axis (series 4/image 33), likely reactive.  No suspicious hilar or axillary lymphadenopathy.  Visualized upper abdomen is unremarkable.  Nonobstructive bowel gas pattern.  IMPRESSION: Suspected small subsegmental right upper lobe pulmonary emboli. Overall clot burden is very small.  Patchy multifocal opacities throughout all lobes, suspicious for multifocal pneumonia.  Critical Value/emergent results were called by telephone at the time of interpretation on 02/08/2012  at 1100 hours  to  Dr. Doug Sou, who verbally acknowledged these results.  Original Report Authenticated By: Charline Bills, M.D.   Dg Chest Port 1 View  02/08/2012  *RADIOLOGY REPORT*  Clinical Data: Chest pain, shortness of breath  PORTABLE CHEST - 1 VIEW  Comparison: 01/28/1929  Findings: Study is limited by patient's large body habitus. Cardiomegaly is noted.  There is interstitial prominence and patchy airspace disease bilateral upper lobe medially.  There is airspace disease in lower lobe right greater than left.  The findings may be due to asymmetric pneumonia or asymmetric pulmonary edema. Clinical correlation is necessary.  IMPRESSION:  There is interstitial prominence and patchy airspace disease bilateral upper lobe medially.  There is airspace disease in lower lobe right greater than left.  The findings may be due to asymmetric pneumonia or asymmetric  pulmonary edema. Clinical correlation is necessary.  Original Report Authenticated By: Natasha Mead, M.D.   X-ray  Knee Right Port  02/03/2012  *RADIOLOGY REPORT*  Clinical Data: Post right knee arthroplasty  PORTABLE RIGHT KNEE - 1-2 VIEW  Comparison: Portable exam 1647 hours compared to 01/08/2008  Findings: Components of right knee prosthesis in expected positions. Scattered soft tissue swelling. Surgical drain and skin clips. No dislocation seen. Tiny bone fragment identified at the medial margin of the proximal tibia compatible with tiny fracture fragment. No definite fracture plane is seen traversing the tibial metaphysis. Uncertain whether this traverses to the medial margin of the tibia, medial to the tibial plateau component.  IMPRESSION: Components of right knee prosthesis in expected position. Small bone fragment identified at medial margin of proximal medial metaphysis question tiny avulsion fracture though cannot exclude extension to the medial margin of the proximal tibia.  Original Report Authenticated By: Lollie Marrow, M.D.     MDM  Plan admit intravenous antibiotics Lovenox Diagnosis #1 healthcare associated pneumonia #2 pulmonary embolism #3 hypokalemia #4 hyperglycemia #5anemia    CRITICAL CARE Performed by: Doug Sou   Total critical care time: 30 minute  Critical care time was exclusive of separately billable procedures and treating other patients.  Critical care was necessary to treat or prevent imminent or life-threatening deterioration.  Critical care was time spent personally by me on the following activities: development of treatment plan with patient and/or surrogate as well as nursing, discussions with consultants, evaluation of patient's response to treatment, examination of patient, obtaining history from patient or surrogate, ordering and performing treatments and interventions, ordering and review of laboratory studies, ordering and review of radiographic studies, pulse oximetry and re-evaluation of patient's condition.    Doug Sou, MD 02/08/12 1149  Doug Sou, MD 02/08/12 1152

## 2012-02-09 DIAGNOSIS — I359 Nonrheumatic aortic valve disorder, unspecified: Secondary | ICD-10-CM

## 2012-02-09 LAB — CBC
HCT: 26.4 % — ABNORMAL LOW (ref 36.0–46.0)
MCH: 27.1 pg (ref 26.0–34.0)
MCH: 27.4 pg (ref 26.0–34.0)
MCHC: 32.6 g/dL (ref 30.0–36.0)
MCV: 83.5 fL (ref 78.0–100.0)
MCV: 84.1 fL (ref 78.0–100.0)
Platelets: 284 10*3/uL (ref 150–400)
Platelets: 308 10*3/uL (ref 150–400)
RBC: 2.91 MIL/uL — ABNORMAL LOW (ref 3.87–5.11)
RDW: 13.8 % (ref 11.5–15.5)
RDW: 14 % (ref 11.5–15.5)
WBC: 10.6 10*3/uL — ABNORMAL HIGH (ref 4.0–10.5)

## 2012-02-09 LAB — COMPREHENSIVE METABOLIC PANEL
AST: 25 U/L (ref 0–37)
Albumin: 2.4 g/dL — ABNORMAL LOW (ref 3.5–5.2)
Alkaline Phosphatase: 83 U/L (ref 39–117)
BUN: 14 mg/dL (ref 6–23)
CO2: 33 mEq/L — ABNORMAL HIGH (ref 19–32)
Chloride: 94 mEq/L — ABNORMAL LOW (ref 96–112)
Creatinine, Ser: 1.02 mg/dL (ref 0.50–1.10)
GFR calc non Af Amer: 62 mL/min — ABNORMAL LOW (ref >90)
Potassium: 3.2 mEq/L — ABNORMAL LOW (ref 3.5–5.1)
Total Bilirubin: 1.1 mg/dL (ref 0.3–1.2)

## 2012-02-09 LAB — PROTIME-INR: Prothrombin Time: 14.5 seconds (ref 11.6–15.2)

## 2012-02-09 LAB — CARDIAC PANEL(CRET KIN+CKTOT+MB+TROPI)
CK, MB: 2.9 ng/mL (ref 0.3–4.0)
Relative Index: 1.6 (ref 0.0–2.5)
Relative Index: 1.8 (ref 0.0–2.5)
Troponin I: <0.3 ng/mL (ref <0.30)
Troponin I: <0.3 ng/mL (ref <0.30)

## 2012-02-09 LAB — GLUCOSE, CAPILLARY
Glucose-Capillary: 133 mg/dL — ABNORMAL HIGH (ref 70–99)
Glucose-Capillary: 140 mg/dL — ABNORMAL HIGH (ref 70–99)

## 2012-02-09 MED ORDER — COUMADIN BOOK
Freq: Once | Status: AC
  Administered 2012-02-09: 18:00:00
  Filled 2012-02-09: qty 1

## 2012-02-09 MED ORDER — ENOXAPARIN SODIUM 150 MG/ML ~~LOC~~ SOLN
1.0000 mg/kg | Freq: Two times a day (BID) | SUBCUTANEOUS | Status: DC
  Administered 2012-02-09 – 2012-02-12 (×7): 160 mg via SUBCUTANEOUS
  Filled 2012-02-09 (×11): qty 2

## 2012-02-09 MED ORDER — WARFARIN SODIUM 7.5 MG PO TABS
7.5000 mg | ORAL_TABLET | Freq: Once | ORAL | Status: AC
  Administered 2012-02-09: 7.5 mg via ORAL
  Filled 2012-02-09: qty 1

## 2012-02-09 NOTE — Progress Notes (Addendum)
ANTICOAGULATION CONSULT NOTE - Follow Up Consult  Pharmacy Consult for Lovenox/Warfarin  Indication: New PE  Allergies  Allergen Reactions  . Hydrocodone     itching    Patient Measurements: Height: 5\' 3"  (160 cm) Weight: 358 lb 0.4 oz (162.4 kg) IBW/kg (Calculated) : 52.4   Vital Signs:    Labs:  Basename 02/09/12 1045 02/09/12 0815 02/09/12 0150 02/08/12 2032 02/08/12 1017 02/08/12 1000  HGB 8.6* -- 7.9* -- -- --  HCT 26.4* -- 24.3* -- 25.0* --  PLT 308 -- 284 -- -- 272  APTT -- -- -- -- -- --  LABPROT -- -- 14.5 -- -- --  INR -- -- 1.11 -- -- --  HEPARINUNFRC -- -- -- -- -- --  CREATININE -- -- 1.02 -- 0.90 --  CKTOTAL -- 162 191* 202* -- --  CKMB -- 2.9 3.0 3.4 -- --  TROPONINI -- <0.30 <0.30 0.35* -- --   Estimated Creatinine Clearance: 98.2 ml/min (by C-G formula based on Cr of 1.02).   Medications:  Scheduled:    . sodium chloride   Intravenous STAT  . ceFEPime (MAXIPIME) IV  1 g Intravenous Q8H  . clopidogrel  75 mg Oral Daily  . dextromethorphan-guaiFENesin  1 tablet Oral BID  . escitalopram  20 mg Oral Daily  . Fluticasone-Salmeterol  1 puff Inhalation BID  . furosemide  40 mg Intravenous Once  . gabapentin  300 mg Oral Daily  . glipiZIDE  5 mg Oral BID AC  . hydrochlorothiazide  25 mg Oral Daily  . insulin aspart  0-5 Units Subcutaneous QHS  . insulin aspart  0-9 Units Subcutaneous TID WC  . levofloxacin (LEVAQUIN) IV  750 mg Intravenous Q24H  . losartan  100 mg Oral Daily  . pantoprazole  40 mg Oral q morning - 10a  . patient's guide to using coumadin book   Does not apply Once  . potassium chloride  40 mEq Oral Once  . potassium chloride  40 mEq Oral Q4H  . rosuvastatin  20 mg Oral Daily  . senna  1 tablet Oral BID  . vancomycin  1,500 mg Intravenous Q12H  . vancomycin  1,000 mg Intravenous To ER  . vancomycin  1,000 mg Intravenous STAT  . warfarin  7.5 mg Oral Once  . warfarin   Does not apply Once  . DISCONTD: enoxaparin (LOVENOX)  injection  1 mg/kg Subcutaneous Q12H  . DISCONTD: losartan  100 mg Oral Daily   Infusions:   PRN: acetaminophen, acetaminophen, albuterol, diphenhydrAMINE, ibuprofen, levalbuterol, methocarbamol, ondansetron (ZOFRAN) IV, ondansetron, oxyCODONE-acetaminophen  Assessment: 51 yo obese F on overlap with Warfarin/Lovenox for new PE. Pt only received 1 dose of Lovenox yesterday, so today will still count as finishing Day #1 of 5 day minimum overlap. No bleeding reported in chart notes. Drug interaction with Levaquin and warfarin noted.  Goal of Therapy:  INR 2-3   Plan:  1) Continue Lovenox 160mg  SQ q12h x 4 more days AND until INR > 2 x 2 days 2) Repeat warfarin 7.5mg  PO x1 at 18:00 3) Warfarin book 4) F/U daily INR trend   [name] [phone] 02/09/2012,1:09 PM

## 2012-02-09 NOTE — Evaluation (Signed)
Physical Therapy Evaluation Patient Details Name: [name] MRN: [id number] DOB: 07-01-1961 Today's Date: 02/09/2012  Problem List:  Patient Active Problem List  Diagnoses  . CAD (coronary artery disease)  . Dyspnea  . Degenerative arthritis of right knee  . PE (pulmonary embolism)  . HAP (hospital-acquired pneumonia)  . Anemia  . Hypertension  . GERD (gastroesophageal reflux disease)  . DM (diabetes mellitus)  . Asthma  . Hypokalemia    Past Medical History:  Past Medical History  Diagnosis Date  . Asthma   . Hypertension   . CAD (coronary artery disease)     Cath 2010 with DES x 1 RCA  . Hyperlipidemia   . GERD (gastroesophageal reflux disease)   . Depression   . Diabetes mellitus 2010  . Arthritis     knees  . Abnormal uterine bleeding     last menstrual period 3 yrs ago.to be evalauated for uterine polyp  . Sleep apnea     never had sleep study  . Aortic stenosis, moderate     per 11/2011 echo   Past Surgical History:  Past Surgical History  Procedure Date  . Tubal ligation   . Carpal tunnel release     Bilateral  . Cholecystectomy   . Hernia repair   . Cardiac catheterization   . Coronary angioplasty     2010  . Knee arthroplasty 02/03/2012    Procedure: COMPUTER ASSISTED TOTAL KNEE ARTHROPLASTY;  Surgeon: [name], MD;  Location: [facility] OR;  Service: Orthopedics;  Laterality: Right;  Right total knee arthroplasty    PT Assessment/Plan/Recommendation PT Assessment Clinical Impression Statement: Pt presents with diagnosis of acute PE. Pt also recently underwent R TKA on 02/06/12. Pt will benefit from skilled PT in the acute care setting to improve strength, activity tolerance, gait in preperation for D/C home.  PT Recommendation/Assessment: Patient will need skilled PT in the acute care venue PT Problem List: Decreased strength;Decreased activity tolerance;Decreased mobility;Pain;Cardiopulmonary status limiting activity;Decreased range of  motion PT Therapy Diagnosis : Difficulty walking;Acute pain;Generalized weakness PT Plan PT Frequency: Min 5X/week PT Treatment/Interventions: Gait training;Therapeutic exercise;Therapeutic activities;Patient/family education;Functional mobility training;Stair training PT Recommendation Recommendations for Other Services: OT consult Follow Up Recommendations: Home health PT PT Goals  Acute Rehab PT Goals PT Goal Formulation: With patient Time For Goal Achievement: 7 days Pt will go Supine/Side to Sit: with modified independence PT Goal: Supine/Side to Sit - Progress: Goal set today Pt will go Sit to Supine/Side: with modified independence PT Goal: Sit to Supine/Side - Progress: Goal set today Pt will go Sit to Stand: with modified independence PT Goal: Sit to Stand - Progress: Goal set today Pt will go Stand to Sit: with modified independence PT Goal: Stand to Sit - Progress: Goal set today Pt will Ambulate: 51 - 150 feet;with modified independence;with least restrictive assistive device PT Goal: Ambulate - Progress: Goal set today Pt will Go Up / Down Stairs: 3-5 stairs;with min assist;with rail(s) PT Goal: Up/Down Stairs - Progress: Goal set today Pt will Perform Home Exercise Program: with supervision, verbal cues required/provided PT Goal: Perform Home Exercise Program - Progress: Goal set today  PT Evaluation Precautions/Restrictions  Precautions Precautions: Knee Required Braces or Orthoses: No Restrictions RLE Weight Bearing: Weight bearing as tolerated Prior Functioning  Home Living Lives With: Spouse Receives Help From: Family Type of Home: House Home Layout: One level Home Access: Stairs to enter Entrance Stairs-Rails: Right Entrance Stairs-Number of Steps: 3 Bathroom Shower/Tub: Librarian, academic  Toilet: Standard Bathroom Accessibility: Yes How Accessible: Accessible via walker Home Adaptive Equipment: Walker - standard Prior Function Level  of Independence: Requires assistive device for independence Cognition Cognition Arousal/Alertness: Awake/alert Overall Cognitive Status: Appears within functional limits for tasks assessed Orientation Level: Oriented X4 Sensation/Coordination Sensation Light Touch: Appears Intact Coordination Gross Motor Movements are Fluid and Coordinated: Yes Fine Motor Movements are Fluid and Coordinated: Yes Extremity Assessment RLE Strength RLE Overall Strength Comments: ROM: 0-73 degrees (sitting). Knee ext >/= 3/5 LLE Assessment LLE Assessment: Not tested Mobility (including Balance) Bed Mobility Bed Mobility: Yes Supine to Sit: 5: Supervision;HOB elevated (Comment degrees);With rails Transfers Transfers: Yes Sit to Stand: 5: Supervision;With upper extremity assist;From bed Stand to Sit: 5: Supervision;With upper extremity assist;To chair/3-in-1 Ambulation/Gait Ambulation/Gait: Yes Ambulation/Gait Assistance Details (indicate cue type and reason): Min-guard assist. VCs safety, pacing. Fatigues easily. Standing rest break with forearms resting on RW. O2 sats 93% on 4L O2.  Ambulation Distance (Feet): 30 Feet (x 2. standing rest break needed) Assistive device: Rolling walker Gait Pattern: Step-to pattern;Decreased step length - left;Decreased step length - right;Decreased stride length;Antalgic  Posture/Postural Control Posture/Postural Control: No significant limitations Exercise  Total Joint Exercises Long Arc Quad: AROM;10 reps;Right;Seated End of Session PT - End of Session Equipment Utilized During Treatment: Gait belt Activity Tolerance: Patient limited by fatigue;Patient limited by pain Patient left: in chair;with call bell in reach General Behavior During Session: [facility] for tasks performed Cognition: [facility] for tasks performed  Rebeca Alert [facility] 02/09/2012, 1:40 PM [phone]

## 2012-02-09 NOTE — Progress Notes (Signed)
Patient ID: [name], female   DOB: 01/30/1961, 51 y.o.   MRN: [id number]  Mrs. Cupples is well-known to me. Re-admitted due to DVT/PE and pneumonia. Started on coumadin and antibiotics.  Feels better today overall. Hgb < 8.  Will likely need a transfusion, bu will have the Hospitalist Service weigh in on this as well. Can be up with PT from my standpoint.

## 2012-02-09 NOTE — Progress Notes (Signed)
UR CHART REVIEWED; B Kue Fox RN, BSN, MHA 

## 2012-02-09 NOTE — Progress Notes (Signed)
Subjective: sittin up in chair, states still with some SOB, but breathing much improved.denies CP, no gross bleeding Objective: Vital signs in last 24 hours: Temp:  [98.6 F (37 C)-99.6 F (37.6 C)] 99.6 F (37.6 C) (02/10 2211) Pulse Rate:  [91-102] 91  (02/10 2211) Resp:  [24-34] 24  (02/10 2211) BP: (122-162)/(56-92) 122/65 mmHg (02/10 2211) SpO2:  [91 %-99 %] 92 % (02/11 0937) Weight:  [162.4 kg (358 lb 0.4 oz)] 162.4 kg (358 lb 0.4 oz) (02/10 1824) Last BM Date: 02/07/12 Intake/Output from previous day: 02/10 0701 - 02/11 0700 In: 960 [P.O.:360; IV Piggyback:600] Out: 3900 [Urine:3900] Intake/Output this shift:      General Appearance:    Alert, cooperative, obese, in no distress  Lungs:     Clear to auscultation bilaterally, respirations unlabored   Heart:    Regular rate and rhythm, S1 and S2 normal, no murmur, rub   or gallop  Abdomen:     Soft, non-tender, bowel sounds active all four quadrants,    no masses, no organomegaly  Extremities:   RLE +1-2 edema, LLE. No edema and no cyanosis  Neurologic:   CNII-XII intact, nonfocal       Weight change:   Intake/Output Summary (Last 24 hours) at 02/09/12 0956 Last data filed at 02/09/12 0546  Gross per 24 hour  Intake    960 ml  Output   3900 ml  Net  -2940 ml    Lab Results:   Basename 02/09/12 0150 02/08/12 1017  NA 134* 137  K 3.2* 3.2*  CL 94* 96  CO2 33* --  GLUCOSE 119* 155*  BUN 14 13  CREATININE 1.02 0.90  CALCIUM 8.5 --    Basename 02/09/12 0150 02/08/12 1017 02/08/12 1000  WBC 10.6* -- 11.0*  HGB 7.9* 8.5* --  HCT 24.3* 25.0* --  PLT 284 -- 272  MCV 83.5 -- 82.5   PT/INR  Basename 02/09/12 0150  LABPROT 14.5  INR 1.11   ABG No results found for this basename: PHART:2,PCO2:2,PO2:2,HCO3:2 in the last 72 hours  Micro Results: No results found for this or any previous visit (from the past 240 hour(s)). Studies/Results: Ct Angio Chest W/cm &/or Wo Cm  02/08/2012  **ADDENDUM**  CREATED: 02/08/2012 12:52:14  As noted in the body of the report, the primary differential consideration for the bilateral pulmonary opacities is interstitial edema.  Infection remains favored due to lack of pleural effusions or cardiomegaly.  **END ADDENDUM** SIGNED BY: Charline Bills, M.D.    02/08/2012  *RADIOLOGY REPORT*  Clinical Data: Shortness of breath, status post total knee replacement, evaluate for PE  CT ANGIOGRAPHY CHEST  Technique:  Multidetector CT imaging of the chest using the standard protocol during bolus administration of intravenous contrast. Multiplanar reconstructed images including MIPs were obtained and reviewed to evaluate the vascular anatomy.  Contrast: OMNIPAQUE IOHEXOL 300 MG/ML IV SOLN  Comparison: Chest radiograph dated 02/08/2012, CT chest dated 02/09/2009  Findings: Suspected small subsegmental filling defects within branches of the right upper lobe pulmonary artery (series 5/images 97 and 105).  If this is a true finding, the overall clot burden is very small.  Patchy/nodular multifocal opacities throughout all lobes, suspicious for multifocal pneumonia.  Interstitial edema is considered unlikely given the nodular appearance.  No pleural effusion or pneumothorax.  Visualized thyroid is unremarkable.  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  Small mediastinal lymph nodes measuring up  to 11 mm short axis (series 4/image 33), likely reactive.  No suspicious hilar or axillary lymphadenopathy.  Visualized upper abdomen is unremarkable.  Nonobstructive bowel gas pattern.  IMPRESSION: Suspected small subsegmental right upper lobe pulmonary emboli. Overall clot burden is very small.  Patchy multifocal opacities throughout all lobes, suspicious for multifocal pneumonia.  Critical Value/emergent results were called by telephone at the time of interpretation on 02/08/2012  at 1100 hours  to  Dr. Doug Sou, who verbally acknowledged these results. Original Report Authenticated By: Charline Bills, M.D.   Dg Chest Port 1 View  02/08/2012  *RADIOLOGY REPORT*  Clinical Data: Chest pain, shortness of breath  PORTABLE CHEST - 1 VIEW  Comparison: 01/28/1929  Findings: Study is limited by patient's large body habitus. Cardiomegaly is noted.  There is interstitial prominence and patchy airspace disease bilateral upper lobe medially.  There is airspace disease in lower lobe right greater than left.  The findings may be due to asymmetric pneumonia or asymmetric pulmonary edema. Clinical correlation is necessary.  IMPRESSION:  There is interstitial prominence and patchy airspace disease bilateral upper lobe medially.  There is airspace disease in lower lobe right greater than left.  The findings may be due to asymmetric pneumonia or asymmetric pulmonary edema. Clinical correlation is necessary.  Original Report Authenticated By: Natasha Mead, M.D.   Medications Scheduled Meds:   . sodium chloride   Intravenous STAT  . ceFEPime (MAXIPIME) IV  1 g Intravenous Q8H  . clopidogrel  75 mg Oral Daily  . dextromethorphan-guaiFENesin  1 tablet Oral BID  . escitalopram  20 mg Oral Daily  . Fluticasone-Salmeterol  1 puff Inhalation BID  . furosemide  40 mg Intravenous Once  . gabapentin  300 mg Oral Daily  . glipiZIDE  5 mg Oral BID AC  . hydrochlorothiazide  25 mg Oral Daily  . insulin aspart  0-5 Units Subcutaneous QHS  . insulin aspart  0-9 Units Subcutaneous TID WC  . levofloxacin (LEVAQUIN) IV  750 mg Intravenous Q24H  . losartan  100 mg Oral Daily  . pantoprazole  40 mg Oral q morning - 10a  . patient's guide to using coumadin book   Does not apply Once  . piperacillin-tazobactam (ZOSYN)  IV  3.375 g Intravenous To ER  . potassium chloride  40 mEq Oral Once  . potassium chloride  40 mEq Oral Q4H  . rosuvastatin  20 mg Oral Daily  . senna  1 tablet Oral BID  . vancomycin  1,500 mg Intravenous Q12H   . vancomycin  1,000 mg Intravenous To ER  . vancomycin  1,000 mg Intravenous STAT  . warfarin  7.5 mg Oral Once  . warfarin   Does not apply Once  . DISCONTD: enoxaparin (LOVENOX) injection  1 mg/kg Subcutaneous Q12H  . DISCONTD: losartan  100 mg Oral Daily   Continuous Infusions:  PRN Meds:.acetaminophen, acetaminophen, albuterol, diphenhydrAMINE, ibuprofen, iohexol, levalbuterol, methocarbamol, ondansetron (ZOFRAN) IV, ondansetron, oxyCODONE-acetaminophen Assessment/Plan:   LOS: 1 day  PE (pulmonary embolism)  As discussed above, morbidly obese patient status post recent R. total knee arthroplasty presenting with the worsening shortness of breath and CT angiogram with a small PE. Will obtain Dopplers of lower extremities to evaluate for DVT. continue Lovenox- (noted lovenox erroneously d/ced as above earlier, but discussed with pharmacy and pt is on it and did not miss any dose),  and Coumadin. Follow stool guaics, recheck hgb given decrease and further manage as appropriate  .HAP (hospital-acquired pneumonia)  -  continue antibiotics with cefepime Levaquin and vancomycin. -follow and descalate abx if continues to do well.  -diuresed well with dose of lasix, follow echo. .Anemia  -slight drop in Hemoglobin stable noted, recheck as above, stool guaics and if + GI consult given that she needs anticoagulation as above  .Hypertension  -Continue outpatient medications  .GERD (gastroesophageal reflux disease)  -Will place on PPI  .DM (diabetes mellitus)  -Continue oral hypoglycemics, monitor Accu-Cheks and cover with sliding scale insulin.  .Asthma  -Stable, continue bronchodilators  .Hypokalemia  replace potassium.  Marland KitchenHistory of coronary artery disease  -She was chest pain-free, continue outpatient medications.  . Status post recent right total knee arthroplasty  .S/P TKA - per ortho, appreciate f/u   Taiven Greenley C 02/09/2012, 9:56 AM

## 2012-02-09 NOTE — Progress Notes (Signed)
*  PRELIMINARY RESULTS* Echocardiogram 2D Echocardiogram has been performed.  [name] 02/09/2012, 2:51 PM

## 2012-02-10 LAB — BASIC METABOLIC PANEL
BUN: 16 mg/dL (ref 6–23)
CO2: 29 mEq/L (ref 19–32)
CO2: 32 mEq/L (ref 19–32)
Calcium: 8.7 mg/dL (ref 8.4–10.5)
Calcium: 8.7 mg/dL (ref 8.4–10.5)
Creatinine, Ser: 0.94 mg/dL (ref 0.50–1.10)
Glucose, Bld: 111 mg/dL — ABNORMAL HIGH (ref 70–99)
Glucose, Bld: 127 mg/dL — ABNORMAL HIGH (ref 70–99)
Sodium: 138 mEq/L (ref 135–145)

## 2012-02-10 LAB — CBC
Hemoglobin: 7.7 g/dL — ABNORMAL LOW (ref 12.0–15.0)
MCH: 26.7 pg (ref 26.0–34.0)
MCV: 84.4 fL (ref 78.0–100.0)
RBC: 2.88 MIL/uL — ABNORMAL LOW (ref 3.87–5.11)

## 2012-02-10 LAB — GLUCOSE, CAPILLARY
Glucose-Capillary: 105 mg/dL — ABNORMAL HIGH (ref 70–99)
Glucose-Capillary: 79 mg/dL (ref 70–99)

## 2012-02-10 LAB — RETICULOCYTES
RBC.: 2.8 MIL/uL — ABNORMAL LOW (ref 3.87–5.11)
Retic Count, Absolute: 78.4 10*3/uL (ref 19.0–186.0)

## 2012-02-10 MED ORDER — WARFARIN SODIUM 7.5 MG PO TABS
7.5000 mg | ORAL_TABLET | Freq: Once | ORAL | Status: AC
  Administered 2012-02-10: 7.5 mg via ORAL
  Filled 2012-02-10: qty 1

## 2012-02-10 MED ORDER — POTASSIUM CHLORIDE CRYS ER 20 MEQ PO TBCR
40.0000 meq | EXTENDED_RELEASE_TABLET | ORAL | Status: AC
  Administered 2012-02-10 (×2): 40 meq via ORAL
  Filled 2012-02-10 (×2): qty 2

## 2012-02-10 MED ORDER — POTASSIUM CHLORIDE CRYS ER 20 MEQ PO TBCR
40.0000 meq | EXTENDED_RELEASE_TABLET | Freq: Once | ORAL | Status: AC
  Administered 2012-02-10: 40 meq via ORAL
  Filled 2012-02-10: qty 2

## 2012-02-10 MED ORDER — POTASSIUM CHLORIDE CRYS ER 20 MEQ PO TBCR
40.0000 meq | EXTENDED_RELEASE_TABLET | Freq: Every day | ORAL | Status: DC
  Administered 2012-02-11 – 2012-02-19 (×9): 40 meq via ORAL
  Filled 2012-02-10 (×9): qty 2

## 2012-02-10 MED ORDER — FUROSEMIDE 40 MG PO TABS
40.0000 mg | ORAL_TABLET | Freq: Two times a day (BID) | ORAL | Status: DC
  Administered 2012-02-11 – 2012-02-19 (×17): 40 mg via ORAL
  Filled 2012-02-10 (×18): qty 1

## 2012-02-10 NOTE — Progress Notes (Signed)
Physical Therapy Treatment Patient Details Name: [name] MRN: [id number] DOB: [birth year] Today's Date: 02/10/2012  PT Assessment/Plan  PT - Assessment/Plan Comments on Treatment Session: Patient tolerating a little further distance today with ambulation.  Unable to do SLR independently and fatigues easily trying to do on her own.  Educated to perform quad sets. PT Plan: Discharge plan remains appropriate PT Frequency: Min 5X/week Follow Up Recommendations: Home health PT Equipment Recommended: None recommended by PT PT Goals  Acute Rehab PT Goals Pt will go Supine/Side to Sit: with modified independence PT Goal: Supine/Side to Sit - Progress: Met Pt will go Sit to Supine/Side: with modified independence PT Goal: Sit to Supine/Side - Progress: Met Pt will go Sit to Stand: with modified independence PT Goal: Sit to Stand - Progress: Met Pt will go Stand to Sit: with modified independence PT Goal: Stand to Sit - Progress: Met Pt will Ambulate: 51 - 150 feet;with least restrictive assistive device;with modified independence PT Goal: Ambulate - Progress: Progressing toward goal Pt will Perform Home Exercise Program: with supervision, verbal cues required/provided PT Goal: Perform Home Exercise Program - Progress: Progressing toward goal  PT Treatment Precautions/Restrictions  Precautions Precautions: Knee Required Braces or Orthoses: No Restrictions RLE Weight Bearing: Weight bearing as tolerated Mobility (including Balance) Bed Mobility Supine to Sit: 6: Modified independent (Device/Increase time) Transfers Sit to Stand: 6: Modified independent (Device/Increase time) Stand to Sit: 6: Modified independent (Device/Increase time) Ambulation/Gait Ambulation/Gait Assistance: 5: Supervision Ambulation/Gait Assistance Details (indicate cue type and reason): standing rest 1/2 way due to dyspnea Ambulation Distance (Feet): 80 Feet Assistive device: Rolling walker Gait  Pattern: Step-to pattern;Decreased stride length;Antalgic    Exercise  Total Joint Exercises Ankle Circles/Pumps: AROM;Both;10 reps;Supine Quad Sets: AROM;Right;5 reps;Supine Straight Leg Raises: AAROM;Right;10 reps;Supine (educated to use sheet to assist herself) Long Arc Quad: AROM;Right;10 reps;Seated Knee Flexion: AROM;Right;10 reps;Seated End of Session PT - End of Session Activity Tolerance: Patient limited by fatigue Patient left: in bed;with call bell in reach;with family/visitor present General Behavior During Session: [facility] for tasks performed Cognition: [facility] for tasks performed  [facility] 02/10/2012, 4:15 PM

## 2012-02-10 NOTE — Progress Notes (Signed)
Patient ID: [name], female   DOB: 09-02-1961, 51 y.o.   MRN: [id number] It is interesting that her venous duplex study did not show a DVT in her right leg.

## 2012-02-10 NOTE — Progress Notes (Signed)
CBG: 61  Treatment: 15 GM carbohydrate snack  Symptoms: Vision changes  Follow-up CBG: Time:1400 CBG Result:105  Possible Reasons for Event: Medication regimen:   Comments/MD notified: protocol    Reilyn Nelson, Yancey Flemings

## 2012-02-10 NOTE — Progress Notes (Addendum)
Subjective:   States she feels better, but still SOB with min activity, denies melena, no hematochezia Objective: Vital signs in last 24 hours: Temp:  [97.9 F (36.6 C)-99.5 F (37.5 C)] 99.5 F (37.5 C) (02/12 1435) Pulse Rate:  [77-109] 109  (02/12 1604) Resp:  [20-24] 20  (02/12 1435) BP: (112-123)/(62-72) 123/62 mmHg (02/12 1435) SpO2:  [93 %-97 %] 93 % (02/12 1604) Weight:  [162.8 kg (358 lb 14.5 oz)] 162.8 kg (358 lb 14.5 oz) (02/12 0532) Last BM Date: 02/09/12 Intake/Output from previous day: 02/11 0701 - 02/12 0700 In: 2730 [P.O.:1180; IV Piggyback:1450] Out: 1250 [Urine:1250] Intake/Output this shift: Total I/O In: 1060 [P.O.:360; IV Piggyback:700] Out: 450 [Urine:450]    General Appearance:    Alert, cooperative, obese, in no distress  Lungs:     Decreased BS at bases, respirations unlabored   Heart:    Regular rate and rhythm, S1 and S2 normal, no murmur, rub   or gallop  Abdomen:     Soft, non-tender, bowel sounds active all four quadrants,    no masses, no organomegaly  Extremities:   RLE +1-2 edema, LLE. No edema and no cyanosis  Neurologic:   CNII-XII intact, nonfocal       Weight change: 0.412 kg (14.5 oz)  Intake/Output Summary (Last 24 hours) at 02/10/12 1819 Last data filed at 02/10/12 1744  Gross per 24 hour  Intake   2160 ml  Output   1050 ml  Net   1110 ml    Lab Results:   Basename 02/10/12 0900 02/10/12 0348  NA 138 137  K 3.7 3.4*  CL 99 97  CO2 32 29  GLUCOSE 127* 111*  BUN 16 17  CREATININE 0.90 0.94  CALCIUM 8.7 8.7    Basename 02/10/12 0348 02/09/12 1045  WBC 7.9 10.4  HGB 7.7* 8.6*  HCT 24.3* 26.4*  PLT 256 308  MCV 84.4 84.1   PT/INR  Basename 02/10/12 0348 02/09/12 0150  LABPROT 14.9 14.5  INR 1.15 1.11   ABG No results found for this basename: PHART:2,PCO2:2,PO2:2,HCO3:2 in the last 72 hours  Micro Results: No results found for this or any previous visit (from the past 240  hour(s)). Studies/Results:  ECHO Study Conclusions  - Left ventricle: The cavity size was normal. Wall thickness was normal. Systolic function was normal. The estimated ejection fraction was in the range of 60% to 65%. Although no diagnostic regional wall motion abnormality was identified, this possibility cannot be completely excluded on the basis of this study. Features are consistent with a pseudonormal left ventricular filling pattern, with concomitant abnormal relaxation and increased filling pressure (grade 2 diastolic dysfunction).  LE DOPPLERS Summary:  - No obvious evidence of deep vein or superficial thrombosis involving the right lower extremity and left lower extremity. - No evidence of Baker's cyst on the right or left. Other specific details can be found in the table(s) above  No results found. Medications Scheduled Meds:    . ceFEPime (MAXIPIME) IV  1 g Intravenous Q8H  . clopidogrel  75 mg Oral Daily  . dextromethorphan-guaiFENesin  1 tablet Oral BID  . enoxaparin (LOVENOX) injection  1 mg/kg Subcutaneous Q12H  . escitalopram  20 mg Oral Daily  . Fluticasone-Salmeterol  1 puff Inhalation BID  . gabapentin  300 mg Oral Daily  . glipiZIDE  5 mg Oral BID AC  . hydrochlorothiazide  25 mg Oral Daily  . insulin aspart  0-5 Units Subcutaneous QHS  . insulin aspart  0-9 Units Subcutaneous TID WC  . levofloxacin (LEVAQUIN) IV  750 mg Intravenous Q24H  . losartan  100 mg Oral Daily  . pantoprazole  40 mg Oral q morning - 10a  . potassium chloride  40 mEq Oral Q4H  . potassium chloride  40 mEq Oral Once  . rosuvastatin  20 mg Oral Daily  . senna  1 tablet Oral BID  . vancomycin  1,500 mg Intravenous Q12H  . warfarin  7.5 mg Oral ONCE-1800   Continuous Infusions:  PRN Meds:.acetaminophen, acetaminophen, albuterol, diphenhydrAMINE, levalbuterol, methocarbamol, ondansetron (ZOFRAN) IV, ondansetron, oxyCODONE-acetaminophen, DISCONTD:  ibuprofen Assessment/Plan:   LOS: 2 days  PE (pulmonary embolism)  As discussed above, morbidly obese patient status post recent R. total knee arthroplasty presenting with the worsening shortness of breath and CT angiogram with a small PE.  lower extremities to -continue Lovenox and Coumadin till INR therapeutic with close monitoring for bleeding inhouse given her anemia.  -stool guaiac neg, recheck hgb  .HAP (hospital-acquired pneumonia)  -Pt remaining afebrile, no leukocytosis and hemodynamically stable -will d/c levaquin, continue vanc, and cefepime follow  .Diastolic CHF Pt still dyspneic, and echo as above- will place on lasix BID, D/C HCTZ, follow  .Anemia  guaiac neg x1, possibly post op anemia -obtain anemia panel, recheck hgb and transfuse as appropriate -follow consult GI pending cinical course and f/u guaiac esp.given that she needs anticoagulation as above   .Hypertension  -Continue outpatient medications   .GERD (gastroesophageal reflux disease)  -Will place on PPI  .DM (diabetes mellitus)  -Continue oral hypoglycemics, monitor Accu-Cheks and cover with sliding scale insulin.  .Asthma  -Stable, continue bronchodilators  .Hypokalemia  . resolved .History of coronary artery disease  -She was chest pain-free, continue outpatient medications.  . Status post recent right total knee arthroplasty  .S/P TKA - per ortho, appreciate f/u   Zyquan Crotty C 02/10/2012, 6:19 PM

## 2012-02-10 NOTE — Progress Notes (Signed)
Patient ID: [name], female   DOB: 08-13-1961, 51 y.o.   MRN: [id number] [name] reports that her right knee is doing well post knee replacement.  She is now 1 week out from surgery.  PT has been working with her here.  Exam: Her right knee incision looks great.  Her calf is soft and her foot is well perfused  Rec: Continue PT for mobility. Awaiting discharge I assume once therapeutic on coumadin

## 2012-02-10 NOTE — Progress Notes (Signed)
ANTICOAGULATION CONSULT NOTE - Follow Up Consult  Pharmacy Consult for Lovenox/Warfarin  Indication: New PE  Allergies  Allergen Reactions  . Hydrocodone     itching    Patient Measurements: Height: 5\' 3"  (160 cm) Weight: 358 lb 14.5 oz (162.8 kg) IBW/kg (Calculated) : 52.4   Vital Signs: Temp: 98.2 F (36.8 C) (02/12 0532) Temp src: Oral (02/12 0532) BP: 112/69 mmHg (02/12 0532) Pulse Rate: 78  (02/12 0532)  Labs:  Basename 02/10/12 0348 02/09/12 1045 02/09/12 0815 02/09/12 0150 02/08/12 2032 02/08/12 1017  HGB 7.7* 8.6* -- -- -- --  HCT 24.3* 26.4* -- 24.3* -- --  PLT 256 308 -- 284 -- --  APTT -- -- -- -- -- --  LABPROT 14.9 -- -- 14.5 -- --  INR 1.15 -- -- 1.11 -- --  HEPARINUNFRC -- -- -- -- -- --  CREATININE 0.94 -- -- 1.02 -- 0.90  CKTOTAL -- -- 162 191* 202* --  CKMB -- -- 2.9 3.0 3.4 --  TROPONINI -- -- <0.30 <0.30 0.35* --   Estimated Creatinine Clearance: 106.8 ml/min (by C-G formula based on Cr of 0.94).   Medications:  Scheduled:     . ceFEPime (MAXIPIME) IV  1 g Intravenous Q8H  . clopidogrel  75 mg Oral Daily  . coumadin book   Does not apply Once  . dextromethorphan-guaiFENesin  1 tablet Oral BID  . enoxaparin (LOVENOX) injection  1 mg/kg Subcutaneous Q12H  . escitalopram  20 mg Oral Daily  . Fluticasone-Salmeterol  1 puff Inhalation BID  . gabapentin  300 mg Oral Daily  . glipiZIDE  5 mg Oral BID AC  . hydrochlorothiazide  25 mg Oral Daily  . insulin aspart  0-5 Units Subcutaneous QHS  . insulin aspart  0-9 Units Subcutaneous TID WC  . levofloxacin (LEVAQUIN) IV  750 mg Intravenous Q24H  . losartan  100 mg Oral Daily  . pantoprazole  40 mg Oral q morning - 10a  . patient's guide to using coumadin book   Does not apply Once  . potassium chloride  40 mEq Oral Q4H  . rosuvastatin  20 mg Oral Daily  . senna  1 tablet Oral BID  . vancomycin  1,500 mg Intravenous Q12H  . vancomycin  1,000 mg Intravenous STAT  . warfarin  7.5 mg Oral  ONCE-1800  . warfarin   Does not apply Once   Infusions:   PRN: acetaminophen, acetaminophen, albuterol, diphenhydrAMINE, levalbuterol, methocarbamol, ondansetron (ZOFRAN) IV, ondansetron, oxyCODONE-acetaminophen, DISCONTD: ibuprofen  Assessment: 51 yo obese F on overlap with Warfarin/Lovenox for new PE. Pt only received 1 dose of Lovenox on 2/10, so today finishes Day #2 of 5 day minimum overlap. No bleeding reported in chart notes. Drug interaction with Levaquin and warfarin noted.  Goal of Therapy:  INR 2-3   Plan:  1) Continue Lovenox 160mg  SQ q12h x 3 more days AND until INR > 2 x 2 days 2) Repeat warfarin 7.5mg  PO x1 at 18:00 3) F/U daily INR trend   Annia Belt [phone] 02/10/2012,7:37 AM

## 2012-02-11 LAB — CBC
HCT: 28.8 % — ABNORMAL LOW (ref 36.0–46.0)
MCH: 26.7 pg (ref 26.0–34.0)
MCV: 85.1 fL (ref 78.0–100.0)
Platelets: 271 10*3/uL (ref 150–400)
Platelets: 290 10*3/uL (ref 150–400)
RBC: 2.88 MIL/uL — ABNORMAL LOW (ref 3.87–5.11)
RDW: 14 % (ref 11.5–15.5)
RDW: 14 % (ref 11.5–15.5)
WBC: 7.4 10*3/uL (ref 4.0–10.5)

## 2012-02-11 LAB — GLUCOSE, CAPILLARY: Glucose-Capillary: 112 mg/dL — ABNORMAL HIGH (ref 70–99)

## 2012-02-11 LAB — IRON AND TIBC: UIBC: 272 ug/dL (ref 125–400)

## 2012-02-11 LAB — BASIC METABOLIC PANEL
CO2: 33 mEq/L — ABNORMAL HIGH (ref 19–32)
Calcium: 8.8 mg/dL (ref 8.4–10.5)
Creatinine, Ser: 0.96 mg/dL (ref 0.50–1.10)

## 2012-02-11 LAB — PROTIME-INR
INR: 1.14 (ref 0.00–1.49)
Prothrombin Time: 14.8 seconds (ref 11.6–15.2)

## 2012-02-11 LAB — FERRITIN: Ferritin: 127 ng/mL (ref 10–291)

## 2012-02-11 MED ORDER — DIPHENHYDRAMINE HCL 25 MG PO CAPS
25.0000 mg | ORAL_CAPSULE | Freq: Once | ORAL | Status: AC
  Administered 2012-02-11: 25 mg via ORAL
  Filled 2012-02-11: qty 1

## 2012-02-11 MED ORDER — FUROSEMIDE 10 MG/ML IJ SOLN
20.0000 mg | Freq: Once | INTRAMUSCULAR | Status: AC
  Administered 2012-02-11: 20 mg via INTRAVENOUS
  Filled 2012-02-11: qty 2

## 2012-02-11 MED ORDER — ACETAMINOPHEN 325 MG PO TABS
650.0000 mg | ORAL_TABLET | Freq: Once | ORAL | Status: DC
  Filled 2012-02-11: qty 2

## 2012-02-11 MED ORDER — VANCOMYCIN HCL 1000 MG IV SOLR
1500.0000 mg | Freq: Two times a day (BID) | INTRAVENOUS | Status: DC
  Administered 2012-02-11 – 2012-02-13 (×4): 1500 mg via INTRAVENOUS
  Filled 2012-02-11 (×8): qty 1500

## 2012-02-11 MED ORDER — WARFARIN SODIUM 10 MG PO TABS
10.0000 mg | ORAL_TABLET | Freq: Once | ORAL | Status: AC
  Administered 2012-02-11: 10 mg via ORAL
  Filled 2012-02-11: qty 1

## 2012-02-11 NOTE — Progress Notes (Signed)
ANTIBIOTIC CONSULT NOTE - FOLLOW UP  Pharmacy Consult for Vanco  Indication: HAP  Allergies  Allergen Reactions  . Hydrocodone     itching    Patient Measurements: Height: 5\' 3"  (160 cm) Weight: 342 lb 2.5 oz (155.2 kg) (standing scale) IBW/kg (Calculated) : 52.4   Vital Signs: Temp: 98.1 F (36.7 C) (02/13 0648) Temp src: Oral (02/13 0648) BP: 143/77 mmHg (02/13 0648) Pulse Rate: 90  (02/13 0648) Intake/Output from previous day: 02/12 0701 - 02/13 0700 In: 1660 [P.O.:360; IV Piggyback:1300] Out: 650 [Urine:650] Intake/Output from this shift:    Labs:  Basename 02/11/12 0500 02/10/12 1900 02/10/12 0900 02/10/12 0348 02/09/12 1045  WBC 7.2 -- -- 7.9 10.4  HGB 7.7* 7.5* -- 7.7* --  PLT 271 -- -- 256 308  LABCREA -- -- -- -- --  CREATININE 0.96 -- 0.90 0.94 --   Estimated Creatinine Clearance: 101.2 ml/min (by C-G formula based on Cr of 0.96). No results found for this basename: VANCOTROUGH:2,VANCOPEAK:2,VANCORANDOM:2,GENTTROUGH:2,GENTPEAK:2,GENTRANDOM:2,TOBRATROUGH:2,TOBRAPEAK:2,TOBRARND:2,AMIKACINPEAK:2,AMIKACINTROU:2,AMIKACIN:2, in the last 72 hours   Microbiology: Recent Results (from the past 720 hour(s))  SURGICAL PCR SCREEN     Status: Normal   Collection Time   01/29/12  1:44 PM      Component Value Range Status Comment   MRSA, PCR NEGATIVE  NEGATIVE  Final    Staphylococcus aureus NEGATIVE  NEGATIVE  Final     Anti-infectives     Start     Dose/Rate Route Frequency Ordered Stop   02/09/12 0400   vancomycin (VANCOCIN) 1,500 mg in sodium chloride 0.9 % 500 mL IVPB        1,500 mg 250 mL/hr over 120 Minutes Intravenous Every 12 hours 02/08/12 1526     02/08/12 1600   ceFEPIme (MAXIPIME) 1 g in dextrose 5 % 50 mL IVPB        1 g 100 mL/hr over 30 Minutes Intravenous 3 times per day 02/08/12 1448     02/08/12 1530   vancomycin (VANCOCIN) IVPB 1000 mg/200 mL premix        1,000 mg 200 mL/hr over 60 Minutes Intravenous STAT 02/08/12 1524 02/09/12 1530   02/08/12 1500   Levofloxacin (LEVAQUIN) IVPB 750 mg  Status:  Discontinued        750 mg 100 mL/hr over 90 Minutes Intravenous Every 24 hours 02/08/12 1448 02/10/12 2308   02/08/12 1200   vancomycin (VANCOCIN) IVPB 1000 mg/200 mL premix        1,000 mg 200 mL/hr over 60 Minutes Intravenous To Emergency Dept 02/08/12 1112 02/08/12 1314   02/08/12 1200  piperacillin-tazobactam (ZOSYN) IVPB 3.375 g       3.375 g 100 mL/hr over 30 Minutes Intravenous To Emergency Dept 02/08/12 1112 02/08/12 1159          Assessment: Day # 4 Vanco (plus Cefepime) for HAP in obese female - both started 2/10. SCr stable, WBC trending down and wnl. No micro data. Will continue current Vanco regimen for now and check a vanco trough tonight.  Goal of Therapy:  Vancomycin trough level 15-20 mcg/ml  Plan:  1) Vanco trough at 15:30 today  Annia Belt 02/11/2012,7:25 AM

## 2012-02-11 NOTE — Progress Notes (Signed)
Physical Therapy Treatment Patient Details Name: [name] MRN: [id number] DOB: [birth year] Today's Date: 02/11/2012  PT Assessment/Plan  PT - Assessment/Plan Comments on Treatment Session: Pt declined OOB since she is receiving blood. Fatigues easily with exercises. Encouraged pt to perform exercises as able and to use sheet to assist. Husband mentioned pt has CPM at home. Recommended pt discuss with Ortho MD if he wants her to use CPM during acute stay.  PT Plan: Discharge plan remains appropriate Follow Up Recommendations: Home health PT Equipment Recommended: None recommended by PT PT Goals  Acute Rehab PT Goals PT Goal: Perform Home Exercise Program - Progress: Progressing toward goal  PT Treatment Precautions/Restrictions  Precautions Precautions: Knee Required Braces or Orthoses: No Restrictions RLE Weight Bearing: Weight bearing as tolerated Mobility (including Balance) Bed Mobility Bed Mobility: No Transfers Transfers: No Ambulation/Gait Ambulation/Gait: No    Exercise  Total Joint Exercises Ankle Circles/Pumps: AROM;Right;15 reps;Supine Quad Sets: AROM;Right;15 reps;Supine Short Arc Quad: AROM;Right;Other reps (comment);Supine (15 reps) Heel Slides: AAROM;Right;15 reps;Supine Hip ABduction/ADduction: AAROM;Right;15 reps;Supine Straight Leg Raises: AAROM;Right;10 reps;Supine End of Session PT - End of Session Activity Tolerance: Patient limited by pain;Patient limited by fatigue Patient left: in bed;with call bell in reach;with family/visitor present General Behavior During Session: [facility] for tasks performed Cognition: [facility] for tasks performed  [name] [facility] 02/11/2012, 4:15 PM [phone]

## 2012-02-11 NOTE — Progress Notes (Signed)
ANTICOAGULATION CONSULT NOTE - Follow Up Consult  Pharmacy Consult for Lovenox/Warfarin  Indication: New PE  Allergies  Allergen Reactions  . Hydrocodone     itching    Patient Measurements: Height: 5\' 3"  (160 cm) Weight: 342 lb 2.5 oz (155.2 kg) (standing scale) IBW/kg (Calculated) : 52.4   Vital Signs: Temp: 98.1 F (36.7 C) (02/13 0648) Temp src: Oral (02/13 0648) BP: 143/77 mmHg (02/13 0648) Pulse Rate: 90  (02/13 0648)  Labs:  Basename 02/11/12 0500 02/10/12 1900 02/10/12 0900 02/10/12 0348 02/09/12 1045 02/09/12 0815 02/09/12 0150 02/08/12 2032  HGB 7.7* 7.5* -- -- -- -- -- --  HCT 24.5* 23.7* -- 24.3* -- -- -- --  PLT 271 -- -- 256 308 -- -- --  APTT -- -- -- -- -- -- -- --  LABPROT 14.8 -- -- 14.9 -- -- 14.5 --  INR 1.14 -- -- 1.15 -- -- 1.11 --  HEPARINUNFRC -- -- -- -- -- -- -- --  CREATININE 0.96 -- 0.90 0.94 -- -- -- --  CKTOTAL -- -- -- -- -- 162 191* 202*  CKMB -- -- -- -- -- 2.9 3.0 3.4  TROPONINI -- -- -- -- -- <0.30 <0.30 0.35*   Estimated Creatinine Clearance: 101.2 ml/min (by C-G formula based on Cr of 0.96).   Medications:  Scheduled:     . ceFEPime (MAXIPIME) IV  1 g Intravenous Q8H  . clopidogrel  75 mg Oral Daily  . dextromethorphan-guaiFENesin  1 tablet Oral BID  . enoxaparin (LOVENOX) injection  1 mg/kg Subcutaneous Q12H  . escitalopram  20 mg Oral Daily  . Fluticasone-Salmeterol  1 puff Inhalation BID  . furosemide  40 mg Oral BID  . gabapentin  300 mg Oral Daily  . glipiZIDE  5 mg Oral BID AC  . hydrochlorothiazide  25 mg Oral Daily  . insulin aspart  0-5 Units Subcutaneous QHS  . insulin aspart  0-9 Units Subcutaneous TID WC  . losartan  100 mg Oral Daily  . pantoprazole  40 mg Oral q morning - 10a  . potassium chloride  40 mEq Oral Once  . potassium chloride  40 mEq Oral QPC breakfast  . rosuvastatin  20 mg Oral Daily  . senna  1 tablet Oral BID  . vancomycin  1,500 mg Intravenous Q12H  . warfarin  7.5 mg Oral ONCE-1800  .  DISCONTD: levofloxacin (LEVAQUIN) IV  750 mg Intravenous Q24H   Infusions:   PRN: acetaminophen, acetaminophen, albuterol, diphenhydrAMINE, levalbuterol, methocarbamol, ondansetron (ZOFRAN) IV, ondansetron, oxyCODONE-acetaminophen  Assessment: 51 yo obese F on overlap with Warfarin/Lovenox for new PE. Finishing Day #3 of 5 day minimum overlap. No bleeding reported in chart notes. Drug interaction with Levaquin and warfarin noted. INR remains relatively unchanged after 3 doses of warfarin. Will give slightly higher warfarin dose tonight. Watch H/H - slow trend down (on 3 anticoagulants - warfarin, Lovenox, Plavix).  Goal of Therapy:  INR 2-3   Plan:  1) Continue Lovenox 160mg  SQ q12h x days AND until INR > 2 x 2 days 2) Warfarin 10mg  PO x1 at 18:00 3) F/U daily INR trend   Annia Belt [phone] 02/11/2012,7:17 AM

## 2012-02-11 NOTE — Progress Notes (Signed)
Subjective: Patient states SOB improving, however SOB with ambulation to bathroom. Objective: Vital signs in last 24 hours: Temp:  [98 F (36.7 C)-99.1 F (37.3 C)] 98.2 F (36.8 C) (02/13 1520) Pulse Rate:  [77-90] 77  (02/13 1520) Resp:  [16-22] 16  (02/13 1520) BP: (108-143)/(65-79) 108/65 mmHg (02/13 1520) SpO2:  [94 %-100 %] 97 % (02/13 1308) Weight:  [155.2 kg (342 lb 2.5 oz)] 155.2 kg (342 lb 2.5 oz) (02/13 0648) Last BM Date: 02/10/12 Intake/Output from previous day: 02/12 0701 - 02/13 0700 In: 1660 [P.O.:360; IV Piggyback:1300] Out: 650 [Urine:650] Intake/Output this shift: Total I/O In: 637.5 [I.V.:500; Blood:137.5] Out: -     General Appearance:    Alert, cooperative, obese, in no distress  Lungs:     Decreased BS at bases, respirations unlabored   Heart:    Regular rate and rhythm, S1 and S2 normal, no murmur, rub   or gallop  Abdomen:     Soft, non-tender, bowel sounds active all four quadrants,    no masses, no organomegaly  Extremities:   RLE +1-2 edema, LLE. No edema and no cyanosis  Neurologic:   CNII-XII intact, nonfocal       Weight change: -7.6 kg (-16 lb 12.1 oz)  Intake/Output Summary (Last 24 hours) at 02/11/12 1640 Last data filed at 02/11/12 1400  Gross per 24 hour  Intake 1857.5 ml  Output    200 ml  Net 1657.5 ml    Lab Results:   Basename 02/11/12 0500 02/10/12 0900  NA 139 138  K 4.0 3.7  CL 101 99  CO2 33* 32  GLUCOSE 96 127*  BUN 14 16  CREATININE 0.96 0.90  CALCIUM 8.8 8.7    Basename 02/11/12 0500 02/10/12 1900 02/10/12 0348  WBC 7.2 -- 7.9  HGB 7.7* 7.5* --  HCT 24.5* 23.7* --  PLT 271 -- 256  MCV 85.1 -- 84.4   PT/INR  Basename 02/11/12 0500 02/10/12 0348  LABPROT 14.8 14.9  INR 1.14 1.15   ABG No results found for this basename: PHART:2,PCO2:2,PO2:2,HCO3:2 in the last 72 hours  Micro Results: No results found for this or any previous visit (from the past 240 hour(s)). Studies/Results:  ECHO Study  Conclusions  - Left ventricle: The cavity size was normal. Wall thickness was normal. Systolic function was normal. The estimated ejection fraction was in the range of 60% to 65%. Although no diagnostic regional wall motion abnormality was identified, this possibility cannot be completely excluded on the basis of this study. Features are consistent with a pseudonormal left ventricular filling pattern, with concomitant abnormal relaxation and increased filling pressure (grade 2 diastolic dysfunction).  LE DOPPLERS Summary:  - No obvious evidence of deep vein or superficial thrombosis involving the right lower extremity and left lower extremity. - No evidence of Baker's cyst on the right or left. Other specific details can be found in the table(s) above  No results found. Medications Scheduled Meds:    . acetaminophen  650 mg Oral Once  . ceFEPime (MAXIPIME) IV  1 g Intravenous Q8H  . clopidogrel  75 mg Oral Daily  . dextromethorphan-guaiFENesin  1 tablet Oral BID  . diphenhydrAMINE  25 mg Oral Once  . enoxaparin (LOVENOX) injection  1 mg/kg Subcutaneous Q12H  . escitalopram  20 mg Oral Daily  . Fluticasone-Salmeterol  1 puff Inhalation BID  . furosemide  20 mg Intravenous Once  . furosemide  40 mg Oral BID  . gabapentin  300 mg Oral  Daily  . glipiZIDE  5 mg Oral BID AC  . hydrochlorothiazide  25 mg Oral Daily  . insulin aspart  0-5 Units Subcutaneous QHS  . insulin aspart  0-9 Units Subcutaneous TID WC  . losartan  100 mg Oral Daily  . pantoprazole  40 mg Oral q morning - 10a  . potassium chloride  40 mEq Oral QPC breakfast  . rosuvastatin  20 mg Oral Daily  . senna  1 tablet Oral BID  . vancomycin  1,500 mg Intravenous Q12H  . warfarin  10 mg Oral ONCE-1800  . warfarin  7.5 mg Oral ONCE-1800  . DISCONTD: levofloxacin (LEVAQUIN) IV  750 mg Intravenous Q24H   Continuous Infusions:  PRN Meds:.acetaminophen, acetaminophen, albuterol, diphenhydrAMINE, levalbuterol,  methocarbamol, ondansetron (ZOFRAN) IV, ondansetron, oxyCODONE-acetaminophen Assessment/Plan:   LOS: 3 days  PE (pulmonary embolism)  As discussed above, morbidly obese patient status post recent R. total knee arthroplasty presenting with the worsening shortness of breath and CT angiogram with a small PE.  lower extremities to -continue Lovenox and Coumadin till INR therapeutic with close monitoring for bleeding inhouse given her anemia.  -stool guaiac neg, recheck hgb  .HAP (hospital-acquired pneumonia)  - Clinically improving. Pt remaining afebrile, no leukocytosis and hemodynamically stable -continue vanc, and cefepime follow  .Diastolic CHF Pt still dyspneic, and echo as above- Continue lasix at current dose.  .Anemia  guaiac neg x1, possibly post op anemia -Hgb 7.7. Will transfuse 2 units of PRBC. Follow h/h.  Marland KitchenHypertension  -Continue outpatient medications   .GERD (gastroesophageal reflux disease)  - PPI  .DM (diabetes mellitus)  -Continue oral hypoglycemics, monitor Accu-Cheks and cover with sliding scale insulin.  .Asthma  -Stable, continue bronchodilators  .Hypokalemia  . resolved .History of coronary artery disease  -She was chest pain-free, continue outpatient medications.  . Status post recent right total knee arthroplasty  .S/P TKA - per ortho, appreciate f/u   [facility] 02/11/2012, 4:40 PM

## 2012-02-12 LAB — TYPE AND SCREEN
ABO/RH(D): A POS
Unit division: 0

## 2012-02-12 LAB — GLUCOSE, CAPILLARY
Glucose-Capillary: 111 mg/dL — ABNORMAL HIGH (ref 70–99)
Glucose-Capillary: 88 mg/dL (ref 70–99)
Glucose-Capillary: 75 mg/dL (ref 70–99)

## 2012-02-12 LAB — BASIC METABOLIC PANEL
Calcium: 8.9 mg/dL (ref 8.4–10.5)
Creatinine, Ser: 1.01 mg/dL (ref 0.50–1.10)
GFR calc non Af Amer: 63 mL/min — ABNORMAL LOW (ref >90)
Glucose, Bld: 120 mg/dL — ABNORMAL HIGH (ref 70–99)
Sodium: 138 mEq/L (ref 135–145)

## 2012-02-12 LAB — CBC
MCH: 27.2 pg (ref 26.0–34.0)
Platelets: 279 10*3/uL (ref 150–400)
RBC: 3.56 MIL/uL — ABNORMAL LOW (ref 3.87–5.11)
RDW: 14.1 % (ref 11.5–15.5)

## 2012-02-12 LAB — PROTIME-INR: INR: 1.11 (ref 0.00–1.49)

## 2012-02-12 LAB — OCCULT BLOOD X 1 CARD TO LAB, STOOL: Fecal Occult Bld: NEGATIVE

## 2012-02-12 MED ORDER — ENOXAPARIN SODIUM 150 MG/ML ~~LOC~~ SOLN
1.0000 mg/kg | Freq: Two times a day (BID) | SUBCUTANEOUS | Status: DC
  Administered 2012-02-13 – 2012-02-19 (×12): 160 mg via SUBCUTANEOUS
  Filled 2012-02-12 (×20): qty 2

## 2012-02-12 MED ORDER — ENOXAPARIN SODIUM 150 MG/ML ~~LOC~~ SOLN
160.0000 mg | Freq: Once | SUBCUTANEOUS | Status: AC
  Administered 2012-02-13: 160 mg via SUBCUTANEOUS
  Filled 2012-02-12: qty 2

## 2012-02-12 MED ORDER — ENOXAPARIN (LOVENOX) PATIENT EDUCATION KIT
PACK | Freq: Once | Status: AC
  Administered 2012-02-12: 16:00:00
  Filled 2012-02-12: qty 1

## 2012-02-12 MED ORDER — WARFARIN SODIUM 10 MG PO TABS
10.0000 mg | ORAL_TABLET | Freq: Once | ORAL | Status: AC
  Administered 2012-02-12: 10 mg via ORAL
  Filled 2012-02-12: qty 1

## 2012-02-12 NOTE — Progress Notes (Signed)
ANTIBIOTIC CONSULT NOTE - FOLLOW UP  Pharmacy Consult for Vancomycin  Indication: HAP  Allergies  Allergen Reactions  . Hydrocodone     itching   Patient Measurements: Height: 5\' 3"  (160 cm) Weight: 342 lb 2.5 oz (155.2 kg) (standing scale) IBW/kg (Calculated) : 52.4   Vital Signs: Temp: 97.6 F (36.4 C) (02/14 1420) Temp src: Oral (02/14 1420) BP: 161/63 mmHg (02/14 1420) Pulse Rate: 94  (02/14 1420) Intake/Output from previous day: 02/13 0701 - 02/14 0700 In: 2607.5 [P.O.:1320; I.V.:500; Blood:137.5; IV Piggyback:650] Out: 650 [Urine:650] Intake/Output from this shift:    Labs:  Basename 02/12/12 0442 02/11/12 1930 02/11/12 0500 02/10/12 0900  WBC 7.4 7.4 7.2 --  HGB 9.7* 9.2* 7.7* --  PLT 279 290 271 --  LABCREA -- -- -- --  CREATININE 1.01 -- 0.96 0.90   Estimated Creatinine Clearance: 96.2 ml/min (by C-G formula based on Cr of 1.01).  Basename 02/12/12 1950  VANCOTROUGH 15.4  VANCOPEAK --  Drue Dun --  GENTTROUGH --  GENTPEAK --  GENTRANDOM --  TOBRATROUGH --  TOBRAPEAK --  TOBRARND --  AMIKACINPEAK --  AMIKACINTROU --  AMIKACIN --     Microbiology: Recent Results (from the past 720 hour(s))  SURGICAL PCR SCREEN     Status: Normal   Collection Time   01/29/12  1:44 PM      Component Value Range Status Comment   MRSA, PCR NEGATIVE  NEGATIVE  Final    Staphylococcus aureus NEGATIVE  NEGATIVE  Final     Anti-infectives     Start     Dose/Rate Route Frequency Ordered Stop   02/11/12 2000   vancomycin (VANCOCIN) 1,500 mg in sodium chloride 0.9 % 500 mL IVPB        1,500 mg 250 mL/hr over 120 Minutes Intravenous Every 12 hours 02/11/12 1729     02/09/12 0400   vancomycin (VANCOCIN) 1,500 mg in sodium chloride 0.9 % 500 mL IVPB  Status:  Discontinued        1,500 mg 250 mL/hr over 120 Minutes Intravenous Every 12 hours 02/08/12 1526 02/11/12 1729   02/08/12 1600   ceFEPIme (MAXIPIME) 1 g in dextrose 5 % 50 mL IVPB        1 g 100 mL/hr over  30 Minutes Intravenous 3 times per day 02/08/12 1448     02/08/12 1530   vancomycin (VANCOCIN) IVPB 1000 mg/200 mL premix        1,000 mg 200 mL/hr over 60 Minutes Intravenous STAT 02/08/12 1524 02/09/12 1530   02/08/12 1500   Levofloxacin (LEVAQUIN) IVPB 750 mg  Status:  Discontinued        750 mg 100 mL/hr over 90 Minutes Intravenous Every 24 hours 02/08/12 1448 02/10/12 2308   02/08/12 1200   vancomycin (VANCOCIN) IVPB 1000 mg/200 mL premix        1,000 mg 200 mL/hr over 60 Minutes Intravenous To Emergency Dept 02/08/12 1112 02/08/12 1314   02/08/12 1200   piperacillin-tazobactam (ZOSYN) IVPB 3.375 g        3.375 g 100 mL/hr over 30 Minutes Intravenous To Emergency Dept 02/08/12 1112 02/08/12 1159         Assessment:  Day #5 Vanc(plus Cefepime) for HAP in obese female. SCr stable, WBC trending down and wnl. No micro data.  Vanc trough within target range.  Goal of Therapy:  Vancomycin trough level 15-20 mcg/ml  Plan:   Continue Vanc 1g IV q12h.  Cont to f/u daily.  Reece Packer 02/12/2012,8:59 PM

## 2012-02-12 NOTE — Progress Notes (Signed)
  Pharmacy Note: Warfarin Education Indication: PE  Warfarin education completed. Pt had already reviewed the book and video. Questions were answered. Pt expressed understanding.   [name], PharmD Candidate [address], PharmD [phone] 02/12/2012 3:17 PM

## 2012-02-12 NOTE — Progress Notes (Signed)
ANTICOAGULATION CONSULT NOTE - Follow Up Consult  Pharmacy Consult for Lovenox/Warfarin  Indication: New PE  Allergies  Allergen Reactions  . Hydrocodone     itching    Patient Measurements: Height: 5\' 3"  (160 cm) Weight: 342 lb 2.5 oz (155.2 kg) (standing scale) IBW/kg (Calculated) : 52.4   Vital Signs: Temp: 98.4 F (36.9 C) (02/14 0923) Temp src: Oral (02/14 0923) BP: 136/68 mmHg (02/14 0923) Pulse Rate: 81  (02/14 0923)  Labs:  Basename 02/12/12 0442 02/11/12 1930 02/11/12 0500 02/10/12 0900 02/10/12 0348  HGB 9.7* 9.2* -- -- --  HCT 30.4* 28.8* 24.5* -- --  PLT 279 290 271 -- --  APTT -- -- -- -- --  LABPROT 14.5 -- 14.8 -- 14.9  INR 1.11 -- 1.14 -- 1.15  HEPARINUNFRC -- -- -- -- --  CREATININE 1.01 -- 0.96 0.90 --  CKTOTAL -- -- -- -- --  CKMB -- -- -- -- --  TROPONINI -- -- -- -- --   Estimated Creatinine Clearance: 96.2 ml/min (by C-G formula based on Cr of 1.01).   Medications:  Scheduled:     . acetaminophen  650 mg Oral Once  . ceFEPime (MAXIPIME) IV  1 g Intravenous Q8H  . clopidogrel  75 mg Oral Daily  . dextromethorphan-guaiFENesin  1 tablet Oral BID  . enoxaparin (LOVENOX) injection  1 mg/kg Subcutaneous Q12H  . escitalopram  20 mg Oral Daily  . Fluticasone-Salmeterol  1 puff Inhalation BID  . furosemide  20 mg Intravenous Once  . furosemide  40 mg Oral BID  . gabapentin  300 mg Oral Daily  . glipiZIDE  5 mg Oral BID AC  . insulin aspart  0-5 Units Subcutaneous QHS  . insulin aspart  0-9 Units Subcutaneous TID WC  . losartan  100 mg Oral Daily  . pantoprazole  40 mg Oral q morning - 10a  . potassium chloride  40 mEq Oral QPC breakfast  . rosuvastatin  20 mg Oral Daily  . senna  1 tablet Oral BID  . vancomycin  1,500 mg Intravenous Q12H  . warfarin  10 mg Oral ONCE-1800  . DISCONTD: hydrochlorothiazide  25 mg Oral Daily  . DISCONTD: vancomycin  1,500 mg Intravenous Q12H   Infusions:   PRN: acetaminophen, acetaminophen, albuterol,  diphenhydrAMINE, levalbuterol, methocarbamol, ondansetron (ZOFRAN) IV, ondansetron, oxyCODONE-acetaminophen  Assessment: 51 yo obese F on overlap with Warfarin/Lovenox for new PE. Finishing Day #4 of 5 day minimum overlap. No bleeding reported in chart notes. Drug interaction with Levaquin and warfarin noted. INR remains relatively unchanged after 3 doses of warfarin. Will give slightly higher warfarin dose tonight. Watch H/H - trended down (on 3 anticoagulants - warfarin, Lovenox, Plavix) but has started trend back up.  Goal of Therapy:  INR 2-3   Plan:  1) Continue Lovenox 160mg  SQ q12h until INR > 2 x 2 days 2) Continue Warfarin 10mg  PO x1 at 18:00 3) F/U daily INR trend   [name], Pharmacy Student  02/12/2012,12:01 PM   Darrol Angel, PharmD [phone] 02/12/2012 2:02 PM

## 2012-02-12 NOTE — Progress Notes (Signed)
Physical Therapy Treatment Patient Details Name: KATHYRN WARMUTH MRN: [id number] DOB: [birth year] Today's Date: 02/12/2012  PT Assessment/Plan  PT - Assessment/Plan Comments on Treatment Session: Pt stilll significantly limited by SOB with activity. Progressing slowly with LE exercises/strengthening. Continue to enourage pt to perform exercises as able.  PT Plan: Discharge plan remains appropriate Follow Up Recommendations: Home health PT Equipment Recommended: None recommended by PT PT Goals  Acute Rehab PT Goals PT Goal: Supine/Side to Sit - Progress: Met PT Goal: Sit to Supine/Side - Progress: Met PT Goal: Sit to Stand - Progress: Met PT Goal: Stand to Sit - Progress: Met PT Goal: Ambulate - Progress: Progressing toward goal PT Goal: Perform Home Exercise Program - Progress: Progressing toward goal  PT Treatment Precautions/Restrictions  Precautions Precautions: Knee Required Braces or Orthoses: No Restrictions RLE Weight Bearing: Weight bearing as tolerated Mobility (including Balance) Bed Mobility Bed Mobility: Yes Supine to Sit: 6: Modified independent (Device/Increase time) Sit to Supine: 6: Modified independent (Device/Increase time) Transfers Transfers: Yes Sit to Stand: 6: Modified independent (Device/Increase time) Stand to Sit: 6: Modified independent (Device/Increase time) Ambulation/Gait Ambulation/Gait: Yes Ambulation/Gait Assistance: 5: Supervision Ambulation/Gait Assistance Details (indicate cue type and reason): VCs for pursed lip breathing and rest breaks. Pt takes standing rest brakes with bil UEs resting on RW. 3 standing rest breaks. Ambulation Distance (Feet): 75 Feet Assistive device: Rolling walker Gait Pattern: Step-to pattern  Posture/Postural Control Posture/Postural Control: No significant limitations Exercise  Total Joint Exercises Ankle Circles/Pumps: AROM;Seated;15 reps;Both Heel Slides: AAROM;20 reps;Seated (2 sets of 10 with  pillowcase under foot) Straight Leg Raises: AAROM;10 reps;Supine (with pt using sheet) Long Arc Quad: AROM;15 reps;Seated End of Session PT - End of Session Equipment Utilized During Treatment: Gait belt (O 2 tank) Activity Tolerance: Patient limited by fatigue (Limited by SOB) Patient left: in bed;with call bell in reach General Behavior During Session: [facility] for tasks performed Cognition: [facility] for tasks performed  Rebeca Alert [facility] 02/12/2012, 11:50 AM [phone]

## 2012-02-12 NOTE — Progress Notes (Signed)
Subjective: Patient states SOB improving. No complaints. Objective: Vital signs in last 24 hours: Temp:  [97.5 F (36.4 C)-98.8 F (37.1 C)] 97.6 F (36.4 C) (02/14 1420) Pulse Rate:  [77-94] 94  (02/14 1420) Resp:  [16-20] 20  (02/14 1420) BP: (108-161)/(58-84) 161/63 mmHg (02/14 1420) SpO2:  [93 %-98 %] 94 % (02/14 1420) Last BM Date: 02/11/12 Intake/Output from previous day: 02/13 0701 - 02/14 0700 In: 2607.5 [P.O.:1320; I.V.:500; Blood:137.5; IV Piggyback:650] Out: 650 [Urine:650] Intake/Output this shift: Total I/O In: 600 [P.O.:600] Out: -     General Appearance:    Alert, cooperative, obese, in no distress  Lungs:     Decreased BS at bases, respirations unlabored   Heart:    Regular rate and rhythm, S1 and S2 normal, no murmur, rub   or gallop  Abdomen:     Soft, non-tender, bowel sounds active all four quadrants,    no masses, no organomegaly  Extremities:   RLE +1-2 edema, LLE. No edema and no cyanosis  Neurologic:   CNII-XII intact, nonfocal       Weight change:   Intake/Output Summary (Last 24 hours) at 02/12/12 1459 Last data filed at 02/12/12 1430  Gross per 24 hour  Intake   2090 ml  Output    650 ml  Net   1440 ml    Lab Results:   Basename 02/12/12 0442 02/11/12 0500  NA 138 139  K 3.8 4.0  CL 96 101  CO2 33* 33*  GLUCOSE 120* 96  BUN 15 14  CREATININE 1.01 0.96  CALCIUM 8.9 8.8    Basename 02/12/12 0442 02/11/12 1930  WBC 7.4 7.4  HGB 9.7* 9.2*  HCT 30.4* 28.8*  PLT 279 290  MCV 85.4 86.0   PT/INR  Basename 02/12/12 0442 02/11/12 0500  LABPROT 14.5 14.8  INR 1.11 1.14   ABG No results found for this basename: PHART:2,PCO2:2,PO2:2,HCO3:2 in the last 72 hours  Micro Results: No results found for this or any previous visit (from the past 240 hour(s)). Studies/Results:  ECHO Study Conclusions  - Left ventricle: The cavity size was normal. Wall thickness was normal. Systolic function was normal. The estimated ejection  fraction was in the range of 60% to 65%. Although no diagnostic regional wall motion abnormality was identified, this possibility cannot be completely excluded on the basis of this study. Features are consistent with a pseudonormal left ventricular filling pattern, with concomitant abnormal relaxation and increased filling pressure (grade 2 diastolic dysfunction).  LE DOPPLERS Summary:  - No obvious evidence of deep vein or superficial thrombosis involving the right lower extremity and left lower extremity. - No evidence of Baker's cyst on the right or left. Other specific details can be found in the table(s) above  No results found. Medications Scheduled Meds:    . acetaminophen  650 mg Oral Once  . ceFEPime (MAXIPIME) IV  1 g Intravenous Q8H  . clopidogrel  75 mg Oral Daily  . dextromethorphan-guaiFENesin  1 tablet Oral BID  . enoxaparin (LOVENOX) injection  1 mg/kg Subcutaneous Q12H  . enoxaparin   Does not apply Once  . escitalopram  20 mg Oral Daily  . Fluticasone-Salmeterol  1 puff Inhalation BID  . furosemide  40 mg Oral BID  . gabapentin  300 mg Oral Daily  . glipiZIDE  5 mg Oral BID AC  . insulin aspart  0-5 Units Subcutaneous QHS  . insulin aspart  0-9 Units Subcutaneous TID WC  . losartan  100  mg Oral Daily  . pantoprazole  40 mg Oral q morning - 10a  . potassium chloride  40 mEq Oral QPC breakfast  . rosuvastatin  20 mg Oral Daily  . senna  1 tablet Oral BID  . vancomycin  1,500 mg Intravenous Q12H  . warfarin  10 mg Oral ONCE-1800  . warfarin  10 mg Oral ONCE-1800  . DISCONTD: hydrochlorothiazide  25 mg Oral Daily  . DISCONTD: vancomycin  1,500 mg Intravenous Q12H   Continuous Infusions:  PRN Meds:.acetaminophen, acetaminophen, albuterol, diphenhydrAMINE, levalbuterol, methocarbamol, ondansetron (ZOFRAN) IV, ondansetron, oxyCODONE-acetaminophen Assessment/Plan:   LOS: 4 days  PE (pulmonary embolism)  As discussed above, morbidly obese patient status  post recent R. total knee arthroplasty presenting with the worsening shortness of breath and CT angiogram with a small PE.  -continue Lovenox and Coumadin till INR therapeutic with close monitoring for bleeding inhouse given her anemia.  -stool guaiac neg, recheck hgb  .HAP (hospital-acquired pneumonia)  - Clinically improving. Pt remaining afebrile, no leukocytosis and hemodynamically stable -continue vanc, and cefepime follow.  .Diastolic CHF Pt still dyspneic, and echo as above- Continue lasix at current dose.  .Anemia  guaiac neg x1, possibly post op anemia -Hgb 9.7. S/p  2 units of PRBC. Follow h/h.  Marland KitchenHypertension  -Continue outpatient medications   .GERD (gastroesophageal reflux disease)  - PPI  .DM (diabetes mellitus)  -Continue oral hypoglycemics, monitor Accu-Cheks and cover with sliding scale insulin.  .Asthma  -Stable, continue bronchodilators  .Hypokalemia  . resolved .History of coronary artery disease  -She was chest pain-free, continue outpatient medications.  . Status post recent right total knee arthroplasty  .S/P TKA - per ortho, appreciate f/u   [facility] 02/12/2012, 2:59 PM

## 2012-02-13 LAB — CBC
HCT: 31.3 % — ABNORMAL LOW (ref 36.0–46.0)
Hemoglobin: 9.9 g/dL — ABNORMAL LOW (ref 12.0–15.0)
RDW: 14.1 % (ref 11.5–15.5)
WBC: 7.6 10*3/uL (ref 4.0–10.5)

## 2012-02-13 LAB — PROTIME-INR
INR: 1.11 (ref 0.00–1.49)
Prothrombin Time: 14.5 seconds (ref 11.6–15.2)

## 2012-02-13 LAB — GLUCOSE, CAPILLARY: Glucose-Capillary: 111 mg/dL — ABNORMAL HIGH (ref 70–99)

## 2012-02-13 LAB — BASIC METABOLIC PANEL
Chloride: 98 mEq/L (ref 96–112)
Creatinine, Ser: 0.9 mg/dL (ref 0.50–1.10)
GFR calc Af Amer: 84 mL/min — ABNORMAL LOW (ref >90)
GFR calc non Af Amer: 72 mL/min — ABNORMAL LOW (ref >90)
Potassium: 4 mEq/L (ref 3.5–5.1)

## 2012-02-13 MED ORDER — WARFARIN SODIUM 7.5 MG PO TABS
15.0000 mg | ORAL_TABLET | Freq: Once | ORAL | Status: AC
  Administered 2012-02-13: 15 mg via ORAL
  Filled 2012-02-13 (×2): qty 2

## 2012-02-13 NOTE — Progress Notes (Signed)
Physical Therapy Treatment Patient Details Name: [name] MRN: [id number] DOB: [birth year] Today's Date: 02/13/2012  PT Assessment/Plan  PT - Assessment/Plan Comments on Treatment Session:  (SOB limits activity tolerance, but pt is making steady gains) PT Plan: Discharge plan remains appropriate PT Frequency: Min 5X/week Recommendations for Other Services: OT consult Follow Up Recommendations: Home health PT Equipment Recommended: None recommended by PT PT Goals  Acute Rehab PT Goals PT Goal Formulation: With patient Time For Goal Achievement: 7 days Pt will go Supine/Side to Sit: with modified independence Pt will go Sit to Stand: with modified independence PT Goal: Sit to Stand - Progress: Met Pt will go Stand to Sit: with modified independence PT Goal: Stand to Sit - Progress: Progressing toward goal Pt will Ambulate: 51 - 150 feet;with least restrictive assistive device;with modified independence PT Goal: Ambulate - Progress: Progressing toward goal Pt will Go Up / Down Stairs: 3-5 stairs;with min assist;with rail(s) PT Goal: Up/Down Stairs - Progress: Not met Pt will Perform Home Exercise Program: with supervision, verbal cues required/provided PT Goal: Perform Home Exercise Program - Progress: Progressing toward goal  PT Treatment Precautions/Restrictions  Precautions Precautions: Knee Required Braces or Orthoses: No Restrictions Weight Bearing Restrictions: Yes RLE Weight Bearing: Weight bearing as tolerated Mobility (including Balance) Bed Mobility Bed Mobility: No Transfers Transfers: Yes Sit to Stand: 6: Modified independent (Device/Increase time);From bed;With upper extremity assist Stand to Sit: 5: Supervision;To chair/3-in-1 Stand to Sit Details: VCs to reach for armrests Ambulation/Gait Ambulation/Gait: Yes Ambulation/Gait Assistance: 5: Supervision Ambulation/Gait Assistance Details (indicate cue type and reason): VCs for breathing  technique Ambulation Distance (Feet): 90 Feet (60' plus 30' with one seated rest for 3 minutes) Assistive device: Rolling walker Gait Pattern: Step-to pattern    Exercise  Total Joint Exercises Ankle Circles/Pumps: AROM;Seated;15 reps;Both Quad Sets: AROM;Right;15 reps;Supine Short Arc Quad: AROM;Right;Other reps (comment);Supine (17) Heel Slides: AAROM;20 reps;Seated;Right Straight Leg Raises: AAROM;10 reps;Supine Long Arc Quad: AROM;15 reps;Seated Knee Flexion: AROM;Right;10 reps;Seated End of Session PT - End of Session Equipment Utilized During Treatment: Gait belt Activity Tolerance: Patient limited by fatigue;Other (comment) (Limited by SOB, but no drop in SaO2 with walking) Patient left: in chair;with call bell in reach;with family/visitor present Nurse Communication: Mobility status for transfers;Mobility status for ambulation;Weight bearing status General Behavior During Session: [facility] for tasks performed Cognition: [facility] for tasks performed  Tamala Ser 02/13/2012, 11:16 AM [phone]

## 2012-02-13 NOTE — Progress Notes (Signed)
Subjective: Patient states SOB improving. No complaints.Patient states had a bout of nasal bleed this morning when she blew her nose, however no further episodes after oxygen has been humidified. Objective: Vital signs in last 24 hours: Temp:  [97.9 F (36.6 C)-98 F (36.7 C)] 97.9 F (36.6 C) (02/15 1426) Pulse Rate:  [77-90] 90  (02/15 1426) Resp:  [18-19] 18  (02/15 1426) BP: (110-144)/(63-66) 141/63 mmHg (02/15 1426) SpO2:  [93 %-97 %] 93 % (02/15 1426) Weight:  [154.4 kg (340 lb 6.2 oz)] 154.4 kg (340 lb 6.2 oz) (02/15 0500) Last BM Date: 02/12/12 Intake/Output from previous day: 02/14 0701 - 02/15 0700 In: 1950 [P.O.:800; IV Piggyback:1150] Out: 651 [Urine:650; Stool:1] Intake/Output this shift: Total I/O In: 600 [P.O.:600] Out: -     General Appearance:    Alert, cooperative, obese, in no distress  Lungs:     Decreased BS at bases, respirations unlabored   Heart:    Regular rate and rhythm, S1 and S2 normal, no murmur, rub   or gallop  Abdomen:     Soft, non-tender, bowel sounds active all four quadrants,    no masses, no organomegaly  Extremities:   RLE +1-2 edema, LLE. No edema and no cyanosis  Neurologic:   CNII-XII intact, nonfocal       Weight change:   Intake/Output Summary (Last 24 hours) at 02/13/12 1745 Last data filed at 02/13/12 1300  Gross per 24 hour  Intake   1400 ml  Output    651 ml  Net    749 ml    Lab Results:   [facility] 02/13/12 0455 02/12/12 0442  NA 139 138  K 4.0 3.8  CL 98 96  CO2 35* 33*  GLUCOSE 108* 120*  BUN 14 15  CREATININE 0.90 1.01  CALCIUM 9.3 8.9    Basename 02/13/12 0455 02/12/12 0442  WBC 7.6 7.4  HGB 9.9* 9.7*  HCT 31.3* 30.4*  PLT 290 279  MCV 86.0 85.4   PT/INR  Basename 02/13/12 0455 02/12/12 0442  LABPROT 14.5 14.5  INR 1.11 1.11   ABG No results found for this basename: PHART:2,PCO2:2,PO2:2,HCO3:2 in the last 72 hours  Micro Results: No results found for this or any previous visit (from the  past 240 hour(s)). Studies/Results:  ECHO Study Conclusions  - Left ventricle: The cavity size was normal. Wall thickness was normal. Systolic function was normal. The estimated ejection fraction was in the range of 60% to 65%. Although no diagnostic regional wall motion abnormality was identified, this possibility cannot be completely excluded on the basis of this study. Features are consistent with a pseudonormal left ventricular filling pattern, with concomitant abnormal relaxation and increased filling pressure (grade 2 diastolic dysfunction).  LE DOPPLERS Summary:  - No obvious evidence of deep vein or superficial thrombosis involving the right lower extremity and left lower extremity. - No evidence of Baker's cyst on the right or left. Other specific details can be found in the table(s) above  No results found. Medications Scheduled Meds:    . acetaminophen  650 mg Oral Once  . ceFEPime (MAXIPIME) IV  1 g Intravenous Q8H  . clopidogrel  75 mg Oral Daily  . dextromethorphan-guaiFENesin  1 tablet Oral BID  . enoxaparin (LOVENOX) injection  1 mg/kg Subcutaneous Q12H  . enoxaparin (LOVENOX) injection  160 mg Subcutaneous Once  . escitalopram  20 mg Oral Daily  . Fluticasone-Salmeterol  1 puff Inhalation BID  . furosemide  40 mg Oral BID  .  gabapentin  300 mg Oral Daily  . glipiZIDE  5 mg Oral BID AC  . insulin aspart  0-5 Units Subcutaneous QHS  . insulin aspart  0-9 Units Subcutaneous TID WC  . losartan  100 mg Oral Daily  . pantoprazole  40 mg Oral q morning - 10a  . potassium chloride  40 mEq Oral QPC breakfast  . rosuvastatin  20 mg Oral Daily  . senna  1 tablet Oral BID  . vancomycin  1,500 mg Intravenous Q12H  . warfarin  10 mg Oral ONCE-1800  . warfarin  15 mg Oral ONCE-1800   Continuous Infusions:  PRN Meds:.acetaminophen, acetaminophen, albuterol, diphenhydrAMINE, levalbuterol, methocarbamol, ondansetron (ZOFRAN) IV, ondansetron,  oxyCODONE-acetaminophen Assessment/Plan:   LOS: 5 days  PE (pulmonary embolism)  As discussed above, morbidly obese patient status post recent R. total knee arthroplasty presenting with the worsening shortness of breath and CT angiogram with a small PE.  -continue Lovenox and Coumadin till INR therapeutic with close monitoring for bleeding inhouse given her anemia. INR not rising. Coumadin dose increased. Follow.  Marland KitchenHAP (hospital-acquired pneumonia)  - Clinically improving. Pt remaining afebrile, no leukocytosis and hemodynamically stable - d/c vanc, and continue cefepime follow.  .Diastolic CHF Pt with improved SOB.  Continue lasix at current dose.  .Anemia  guaiac neg x1, possibly post op anemia -Hgb 9.9. S/p  2 units of PRBC. Follow h/h.  Marland KitchenHypertension  -Continue outpatient medications   .GERD (gastroesophageal reflux disease)  - PPI  .DM (diabetes mellitus)  -Continue oral hypoglycemics, monitor Accu-Cheks and cover with sliding scale insulin.  .Asthma  -Stable, continue bronchodilators  .Hypokalemia  . resolved .History of coronary artery disease  -She was chest pain-free, continue outpatient medications.  . Status post recent right total knee arthroplasty  .S/P TKA - per ortho, appreciate f/u   [facility] 02/13/2012, 5:45 PM

## 2012-02-13 NOTE — Progress Notes (Signed)
Talked to patient about follow up medical care. Recently discharged from the hospital and is active with [facility] for HHPT; patient will also need HHRN - new PE. Patient's Cardiologist is [facility] and is to follow up at their coumadin clinic at discharge. All DME has been delivered to her home from previous admission. Norberta Keens RN with [facility] called and updated about DCP; Abelino Derrick RN, BSN, MHA

## 2012-02-13 NOTE — Progress Notes (Signed)
ANTICOAGULATION CONSULT NOTE - Follow Up Consult  Pharmacy Consult for Lovenox/Warfarin  Indication: New PE  Allergies  Allergen Reactions  . Hydrocodone     itching    Patient Measurements: Height: 5\' 3"  (160 cm) Weight: 340 lb 6.2 oz (154.4 kg) (bed scale) IBW/kg (Calculated) : 52.4   Vital Signs: Temp: 98 F (36.7 C) (02/15 0540) Temp src: Oral (02/15 0540) BP: 144/66 mmHg (02/15 0540) Pulse Rate: 80  (02/15 0540)  Labs:  Basename 02/13/12 0455 02/12/12 0442 02/11/12 1930 02/11/12 0500  HGB 9.9* 9.7* -- --  HCT 31.3* 30.4* 28.8* --  PLT 290 279 290 --  APTT -- -- -- --  LABPROT 14.5 14.5 -- 14.8  INR 1.11 1.11 -- 1.14  HEPARINUNFRC -- -- -- --  CREATININE 0.90 1.01 -- 0.96  CKTOTAL -- -- -- --  CKMB -- -- -- --  TROPONINI -- -- -- --   Estimated Creatinine Clearance: 107.6 ml/min (by C-G formula based on Cr of 0.9).   Medications:  Scheduled:     . acetaminophen  650 mg Oral Once  . ceFEPime (MAXIPIME) IV  1 g Intravenous Q8H  . clopidogrel  75 mg Oral Daily  . dextromethorphan-guaiFENesin  1 tablet Oral BID  . enoxaparin (LOVENOX) injection  1 mg/kg Subcutaneous Q12H  . enoxaparin (LOVENOX) injection  160 mg Subcutaneous Once  . enoxaparin   Does not apply Once  . escitalopram  20 mg Oral Daily  . Fluticasone-Salmeterol  1 puff Inhalation BID  . furosemide  40 mg Oral BID  . gabapentin  300 mg Oral Daily  . glipiZIDE  5 mg Oral BID AC  . insulin aspart  0-5 Units Subcutaneous QHS  . insulin aspart  0-9 Units Subcutaneous TID WC  . losartan  100 mg Oral Daily  . pantoprazole  40 mg Oral q morning - 10a  . potassium chloride  40 mEq Oral QPC breakfast  . rosuvastatin  20 mg Oral Daily  . senna  1 tablet Oral BID  . vancomycin  1,500 mg Intravenous Q12H  . warfarin  10 mg Oral ONCE-1800  . warfarin  15 mg Oral ONCE-1800  . DISCONTD: enoxaparin (LOVENOX) injection  1 mg/kg Subcutaneous Q12H   Infusions:   PRN: acetaminophen, acetaminophen,  albuterol, diphenhydrAMINE, levalbuterol, methocarbamol, ondansetron (ZOFRAN) IV, ondansetron, oxyCODONE-acetaminophen  Inpatient warfarin doses this admission: 7.5, 7.5, 7.5, 10, 10mg      Assessment:  51 yo F on Lovenox / Warfarin for acute PE, on day #5 overlap  Minimal INR response to date.  Concomitant Plavix, Levaquin noted. .  Patient was counseled on Coumadin on 2/14 and today discussed with patient limiting dark, leafy green vegetables while in hospital in hopes of seeing movement in INR.    Goal of Therapy:  INR 2-3   Plan:  1) Continue Lovenox 160mg  SQ q12h until INR > 2 x 2 consecutive days 2) Warfarin 15mg  PO x1 at 18:00 3) F/U daily INR trend   [name], Pharmacy Student  02/13/2012,1:39 PM  Elie Goody, PharmD, BCPS Pager: [phone] 02/13/2012  1:57 PM

## 2012-02-13 NOTE — Progress Notes (Addendum)
Pt voided in bedside commode.  Urine is clear yellow, but there are a few tiny red clots/fibers in urine.  Pt states prior to her knee surgery, she was having bloody vaginal discharge and she needs to have surgery for this.  Will monitor.  At 0445, pt up to [facility] to urinate.  Urine is clear yellow, no clots presents.  Will continue to monitor.

## 2012-02-14 LAB — GLUCOSE, CAPILLARY
Glucose-Capillary: 119 mg/dL — ABNORMAL HIGH (ref 70–99)
Glucose-Capillary: 82 mg/dL (ref 70–99)
Glucose-Capillary: 142 mg/dL — ABNORMAL HIGH (ref 70–99)

## 2012-02-14 LAB — PROTIME-INR: INR: 1.2 (ref 0.00–1.49)

## 2012-02-14 MED ORDER — WARFARIN SODIUM 7.5 MG PO TABS
15.0000 mg | ORAL_TABLET | Freq: Once | ORAL | Status: AC
  Administered 2012-02-14: 15 mg via ORAL
  Filled 2012-02-14: qty 2

## 2012-02-14 NOTE — Progress Notes (Signed)
Patient ID: [name], female   DOB: January 01, 1961, 51 y.o.   MRN: [id number] Mrs. Kachmar is now about 11 days post a right total knee replacement.  In spite of her PE, no DVT was found.  Her breathing is much better and she has been working with PT (mobility and knee motion).  Her right knee incision looks great.  She is still sub-therapeutic on coumadin.  I'll probably remove her staples early next week if she is still here.

## 2012-02-14 NOTE — Progress Notes (Signed)
Subjective: Patient states SOB improving. No complaints. Objective: Vital signs in last 24 hours: Temp:  [97.9 F (36.6 C)-98.3 F (36.8 C)] 98.3 F (36.8 C) (02/16 1218) Pulse Rate:  [75-90] 85  (02/16 1218) Resp:  [18] 18  (02/16 1218) BP: (133-143)/(63-83) 133/71 mmHg (02/16 1218) SpO2:  [92 %-95 %] 94 % (02/16 1218) Weight:  [152.4 kg (335 lb 15.7 oz)] 152.4 kg (335 lb 15.7 oz) (02/16 0554) Last BM Date: 02/13/12 (per patient ) Intake/Output from previous day: 02/15 0701 - 02/16 0700 In: 1110 [P.O.:960; IV Piggyback:150] Out: 3 [Urine:3] Intake/Output this shift: Total I/O In: 360 [P.O.:360] Out: -     General Appearance:    Alert, cooperative, obese, in no distress  Lungs:     Decreased BS at bases, respirations unlabored   Heart:    Regular rate and rhythm, S1 and S2 normal, no murmur, rub   or gallop  Abdomen:     Soft, non-tender, bowel sounds active all four quadrants,    no masses, no organomegaly  Extremities:   RLE +1-2 edema, LLE. No edema and no cyanosis. Wound site c/d/i staples in place.  Neurologic:   CNII-XII intact, nonfocal       Weight change: -2 kg (-4 lb 6.5 oz)  Intake/Output Summary (Last 24 hours) at 02/14/12 1341 Last data filed at 02/14/12 0932  Gross per 24 hour  Intake    820 ml  Output      2 ml  Net    818 ml    Lab Results:   [facility] 02/13/12 0455 02/12/12 0442  NA 139 138  K 4.0 3.8  CL 98 96  CO2 35* 33*  GLUCOSE 108* 120*  BUN 14 15  CREATININE 0.90 1.01  CALCIUM 9.3 8.9    Basename 02/13/12 0455 02/12/12 0442  WBC 7.6 7.4  HGB 9.9* 9.7*  HCT 31.3* 30.4*  PLT 290 279  MCV 86.0 85.4   PT/INR  Basename 02/14/12 0529 02/13/12 0455  LABPROT 15.5* 14.5  INR 1.20 1.11   ABG No results found for this basename: PHART:2,PCO2:2,PO2:2,HCO3:2 in the last 72 hours  Micro Results: No results found for this or any previous visit (from the past 240 hour(s)). Studies/Results:  ECHO Study Conclusions  - Left  ventricle: The cavity size was normal. Wall thickness was normal. Systolic function was normal. The estimated ejection fraction was in the range of 60% to 65%. Although no diagnostic regional wall motion abnormality was identified, this possibility cannot be completely excluded on the basis of this study. Features are consistent with a pseudonormal left ventricular filling pattern, with concomitant abnormal relaxation and increased filling pressure (grade 2 diastolic dysfunction).  LE DOPPLERS Summary:  - No obvious evidence of deep vein or superficial thrombosis involving the right lower extremity and left lower extremity. - No evidence of Baker's cyst on the right or left. Other specific details can be found in the table(s) above  No results found. Medications Scheduled Meds:    . acetaminophen  650 mg Oral Once  . ceFEPime (MAXIPIME) IV  1 g Intravenous Q8H  . clopidogrel  75 mg Oral Daily  . dextromethorphan-guaiFENesin  1 tablet Oral BID  . enoxaparin (LOVENOX) injection  1 mg/kg Subcutaneous Q12H  . escitalopram  20 mg Oral Daily  . Fluticasone-Salmeterol  1 puff Inhalation BID  . furosemide  40 mg Oral BID  . gabapentin  300 mg Oral Daily  . glipiZIDE  5 mg Oral BID AC  .  insulin aspart  0-5 Units Subcutaneous QHS  . insulin aspart  0-9 Units Subcutaneous TID WC  . losartan  100 mg Oral Daily  . pantoprazole  40 mg Oral q morning - 10a  . potassium chloride  40 mEq Oral QPC breakfast  . rosuvastatin  20 mg Oral Daily  . senna  1 tablet Oral BID  . warfarin  15 mg Oral ONCE-1800  . warfarin  15 mg Oral ONCE-1800  . DISCONTD: vancomycin  1,500 mg Intravenous Q12H   Continuous Infusions:  PRN Meds:.acetaminophen, acetaminophen, albuterol, diphenhydrAMINE, levalbuterol, methocarbamol, ondansetron (ZOFRAN) IV, ondansetron, oxyCODONE-acetaminophen Assessment/Plan:   LOS: 6 days  PE (pulmonary embolism)  As discussed above, morbidly obese patient status post recent  R. total knee arthroplasty presenting with the worsening shortness of breath and CT angiogram with a small PE.  -continue Lovenox and Coumadin till INR therapeutic with close monitoring for bleeding inhouse given her anemia. INR now slowly  rising. Coumadin dose increased. Follow.  Marland KitchenHAP (hospital-acquired pneumonia)  - Clinically improving. Pt remaining afebrile, no leukocytosis and hemodynamically stable - Continue cefepime follow.  .Diastolic CHF Pt with improved SOB.  Continue lasix at current dose.  .Anemia  guaiac neg x1, possibly post op anemia -Hgb stable. S/p  2 units of PRBC. Follow h/h.  Marland KitchenHypertension  -Continue outpatient medications   .GERD (gastroesophageal reflux disease)  - PPI  .DM (diabetes mellitus)  -Continue oral hypoglycemics, monitor Accu-Cheks and cover with sliding scale insulin.  .Asthma  -Stable, continue bronchodilators  .Hypokalemia  . resolved .History of coronary artery disease  -She was chest pain-free, continue outpatient medications.  . Status post recent right total knee arthroplasty  .S/P TKA - per ortho, appreciate f/u   [facility] 02/14/2012, 1:41 PM

## 2012-02-14 NOTE — Progress Notes (Signed)
Physical Therapy Treatment Patient Details Name: CLOIE WOODEN MRN: [id number] DOB: November 17, 1961 Today's Date: 02/14/2012  PE recent R TKR Magnus Ivan) 13:45 - 14;05 1 te  PT Assessment/Plan  PT - Assessment/Plan Comments on Treatment Session: Pt stated she walk the hallway earlier with no O2 with her RW and did well, just needed a few standing rest breaks.  Pt plans to D/C to home in the next couple of days.  Pt given handout on TKR HEP and performed 10 reps of all.  Pt states she feels a whole lot better. PT Plan: Discharge plan remains appropriate Follow Up Recommendations: Home health PT Equipment Recommended: None recommended by PT PT Goals  Acute Rehab PT Goals PT Goal Formulation: With patient Pt will go Supine/Side to Sit: with modified independence PT Goal: Supine/Side to Sit - Progress: Met Pt will go Sit to Supine/Side: with modified independence PT Goal: Sit to Supine/Side - Progress: Met Pt will go Sit to Stand: with modified independence PT Goal: Sit to Stand - Progress: Progressing toward goal Pt will go Stand to Sit: with modified independence PT Goal: Stand to Sit - Progress: Progressing toward goal Pt will Ambulate: 51 - 150 feet;with modified independence;with least restrictive assistive device PT Goal: Ambulate - Progress: Progressing toward goal Pt will Go Up / Down Stairs: 3-5 stairs;with min assist;with rail(s) PT Goal: Up/Down Stairs - Progress: Progressing toward goal Pt will Perform Home Exercise Program: with supervision, verbal cues required/provided PT Goal: Perform Home Exercise Program - Progress: Met  PT Treatment Precautions/Restrictions  Precautions Precautions: Knee Required Braces or Orthoses: No Restrictions Weight Bearing Restrictions: No RLE Weight Bearing: Weight bearing as tolerated Mobility (including Balance)   Pt stated she walked the hall earlier with her RW on RA and staff reports she did well   Exercise  Pt given handout on  TKR HEP and performed all with instruction Total Joint Exercises Ankle Circles/Pumps: AROM;Both;10 reps;Supine Quad Sets: AROM;Both;10 reps;Supine Gluteal Sets: AROM;Both;10 reps;Supine Towel Squeeze: AROM;Both;10 reps;Supine Short Arc Quad: AROM;Right;10 reps;Supine Heel Slides: AROM;Right;10 reps;Supine Hip ABduction/ADduction: AROM;Right;10 reps;Supine Straight Leg Raises: AROM;Right;10 reps;Supine Long Arc Quad: AROM;Right;10 reps;Seated Knee Flexion: AROM;Right;10 reps;Seated End of Session Applied ICE to R Knee/reports 3/10 pain after TE's PT - End of Session Activity Tolerance: Patient tolerated treatment well Patient left: in bed;with call bell in reach General Behavior During Session: [facility] for tasks performed Cognition: [facility] for tasks performed  Felecia Shelling  PTA [facility]  Acute  Rehab Pager     [phone]

## 2012-02-14 NOTE — Progress Notes (Signed)
ANTICOAGULATION CONSULT NOTE - Follow Up Consult  Pharmacy Consult for Lovenox/Warfarin  Indication: New PE  Allergies  Allergen Reactions  . Hydrocodone     itching    Patient Measurements: Height: 5\' 3"  (160 cm) Weight: 335 lb 15.7 oz (152.4 kg) (Standing scale) IBW/kg (Calculated) : 52.4   Vital Signs: Temp: 98.3 F (36.8 C) (02/16 0554) Temp src: Oral (02/16 0554) BP: 134/83 mmHg (02/16 0554) Pulse Rate: 80  (02/16 0554)  Labs:  Basename 02/14/12 0529 02/13/12 0455 02/12/12 0442 02/11/12 1930  HGB -- 9.9* 9.7* --  HCT -- 31.3* 30.4* 28.8*  PLT -- 290 279 290  APTT -- -- -- --  LABPROT 15.5* 14.5 14.5 --  INR 1.20 1.11 1.11 --  HEPARINUNFRC -- -- -- --  CREATININE -- 0.90 1.01 --  CKTOTAL -- -- -- --  CKMB -- -- -- --  TROPONINI -- -- -- --   Estimated Creatinine Clearance: 106.7 ml/min (by C-G formula based on Cr of 0.9).   Medications:  Scheduled:     . acetaminophen  650 mg Oral Once  . ceFEPime (MAXIPIME) IV  1 g Intravenous Q8H  . clopidogrel  75 mg Oral Daily  . dextromethorphan-guaiFENesin  1 tablet Oral BID  . enoxaparin (LOVENOX) injection  1 mg/kg Subcutaneous Q12H  . escitalopram  20 mg Oral Daily  . Fluticasone-Salmeterol  1 puff Inhalation BID  . furosemide  40 mg Oral BID  . gabapentin  300 mg Oral Daily  . glipiZIDE  5 mg Oral BID AC  . insulin aspart  0-5 Units Subcutaneous QHS  . insulin aspart  0-9 Units Subcutaneous TID WC  . losartan  100 mg Oral Daily  . pantoprazole  40 mg Oral q morning - 10a  . potassium chloride  40 mEq Oral QPC breakfast  . rosuvastatin  20 mg Oral Daily  . senna  1 tablet Oral BID  . warfarin  15 mg Oral ONCE-1800  . DISCONTD: vancomycin  1,500 mg Intravenous Q12H   Infusions:   PRN: acetaminophen, acetaminophen, albuterol, diphenhydrAMINE, levalbuterol, methocarbamol, ondansetron (ZOFRAN) IV, ondansetron, oxyCODONE-acetaminophen  Assessment:  51 yo F on Lovenox / Warfarin for acute PE on day #6  overlap.  Minimal INR response to date despite large coumadin doses.  Concomitant Plavix noted.    CBC ok. No bleeding/complications reported.  Goal of Therapy:  INR 2-3   Plan:  1) Continue Lovenox 160mg  SQ q12h until INR > 2 x 2 consecutive days 2) Repeat Warfarin 15mg  PO at 1800. 3) F/U daily INR trend  Clance Boll, PharmD Pager: [phone] 02/14/2012 11:07 AM

## 2012-02-15 ENCOUNTER — Inpatient Hospital Stay (HOSPITAL_COMMUNITY): Payer: Medicaid Other

## 2012-02-15 DIAGNOSIS — R319 Hematuria, unspecified: Secondary | ICD-10-CM | POA: Diagnosis not present

## 2012-02-15 LAB — BASIC METABOLIC PANEL
BUN: 16 mg/dL (ref 6–23)
Calcium: 9 mg/dL (ref 8.4–10.5)
Chloride: 95 mEq/L — ABNORMAL LOW (ref 96–112)
Creatinine, Ser: 0.89 mg/dL (ref 0.50–1.10)
GFR calc Af Amer: 85 mL/min — ABNORMAL LOW (ref >90)
GFR calc non Af Amer: 73 mL/min — ABNORMAL LOW (ref >90)

## 2012-02-15 LAB — CBC
HCT: 32.1 % — ABNORMAL LOW (ref 36.0–46.0)
MCH: 27.2 pg (ref 26.0–34.0)
MCHC: 32.1 g/dL (ref 30.0–36.0)
MCV: 84.7 fL (ref 78.0–100.0)
Platelets: 259 10*3/uL (ref 150–400)
RDW: 14.3 % (ref 11.5–15.5)
WBC: 8.1 10*3/uL (ref 4.0–10.5)

## 2012-02-15 LAB — GLUCOSE, CAPILLARY
Glucose-Capillary: 118 mg/dL — ABNORMAL HIGH (ref 70–99)
Glucose-Capillary: 129 mg/dL — ABNORMAL HIGH (ref 70–99)
Glucose-Capillary: 213 mg/dL — ABNORMAL HIGH (ref 70–99)

## 2012-02-15 LAB — URINALYSIS, ROUTINE W REFLEX MICROSCOPIC
Bilirubin Urine: NEGATIVE
Ketones, ur: NEGATIVE mg/dL
Nitrite: NEGATIVE
Specific Gravity, Urine: 1.027 (ref 1.005–1.030)
Urobilinogen, UA: 0.2 mg/dL (ref 0.0–1.0)

## 2012-02-15 LAB — URINE MICROSCOPIC-ADD ON

## 2012-02-15 IMAGING — CT CT ABD-PELV W/O CM
1 series · 15 of 28 positions shown, 19 images · non-contrast
Comparison: Plain films of [DATE].  Most recent CT of
[DATE].

CLINICAL DATA: History of urinary tract stones and painless
hematuria.  On blood thinners.  History of abnormal uterine
bleeding.  Pulmonary embolism.  Hypertension.  Diabetes.

CT ABDOMEN AND PELVIS WITHOUT CONTRAST (CT UROGRAM)
TECHNIQUE: Contiguous axial images of the abdomen and pelvis
without oral or intravenous contrast were obtained.

[Series 4: lung windows · axial · 0.86mm/px · z∈[-215,-95]mm · 15 of 28 slices shown, 19 images]
[im 3/28  soft-tissue]
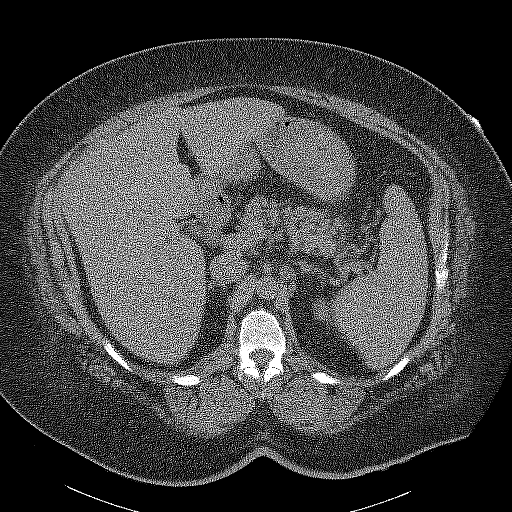
[im 3/28  bone]
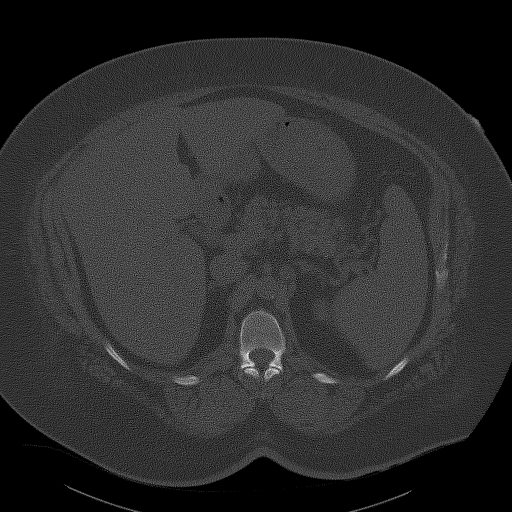
[im 5/28  soft-tissue]
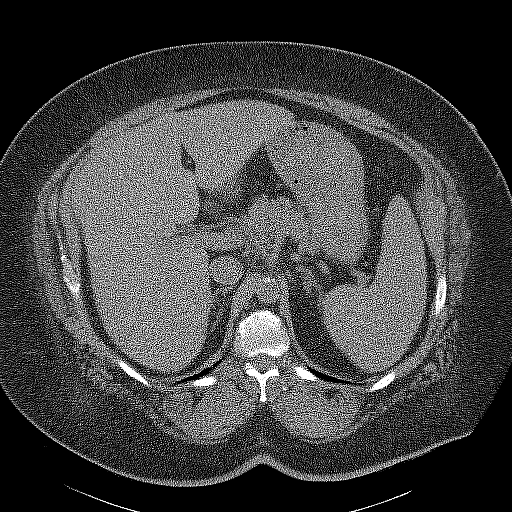
[im 7/28  soft-tissue]
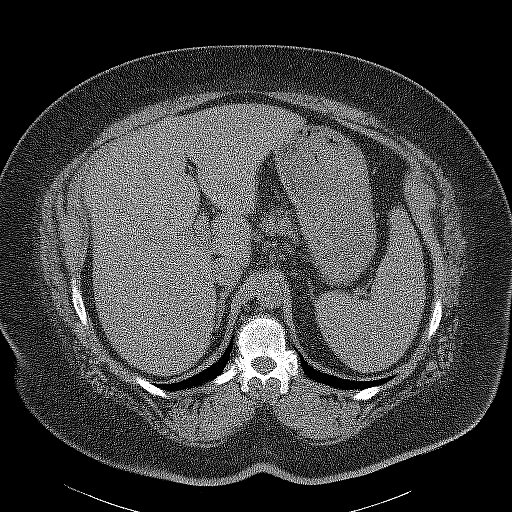
[im 9/28  soft-tissue]
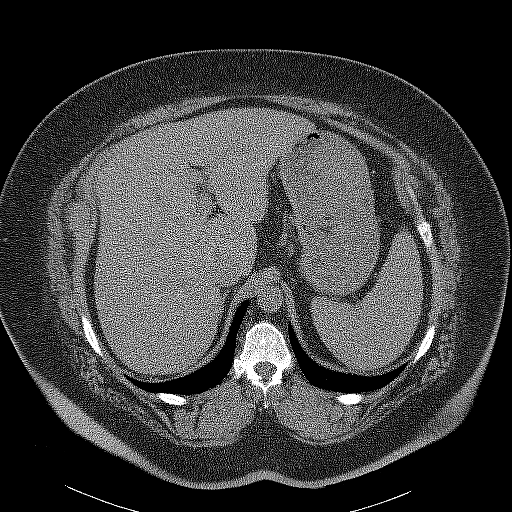
[im 11/28  soft-tissue]
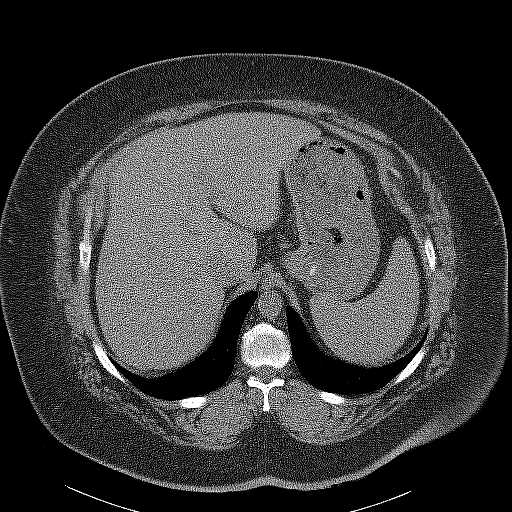
[im 13/28  soft-tissue]
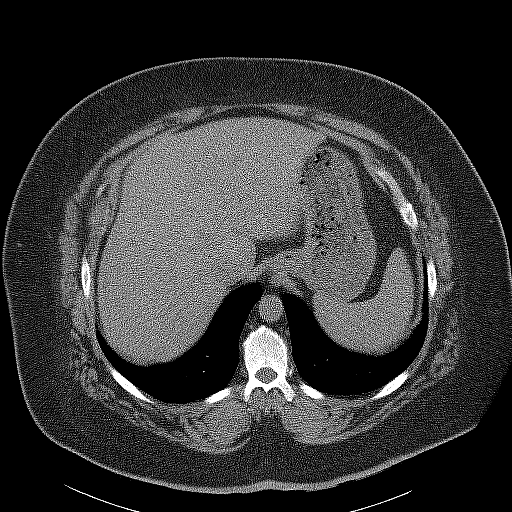
[im 15/28  soft-tissue]
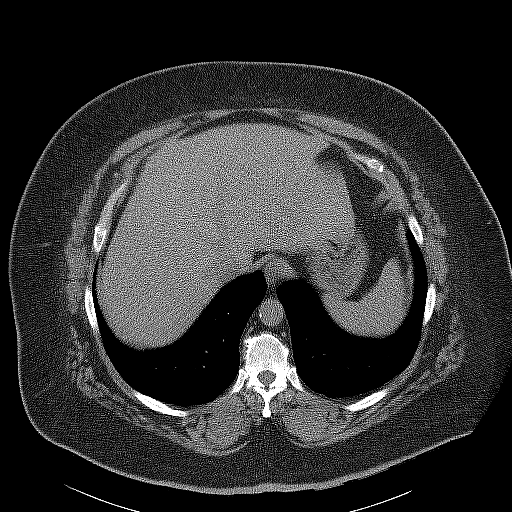
[im 17/28  soft-tissue]
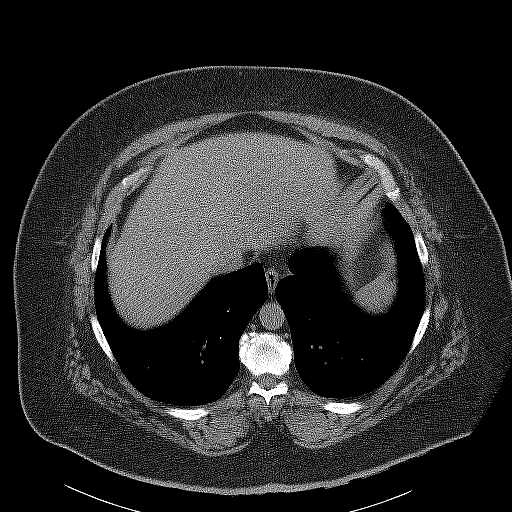
[im 19/28  soft-tissue]
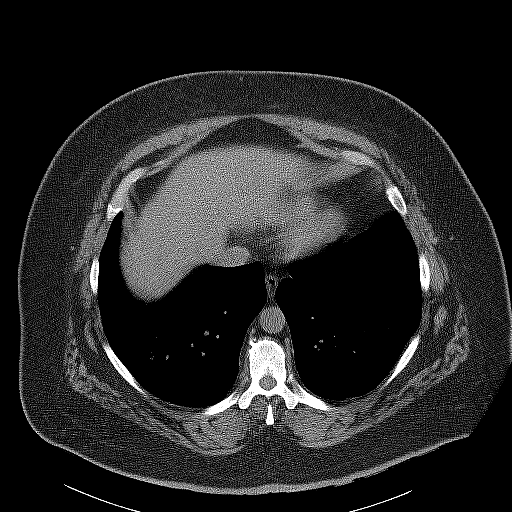
[im 19/28  bone]
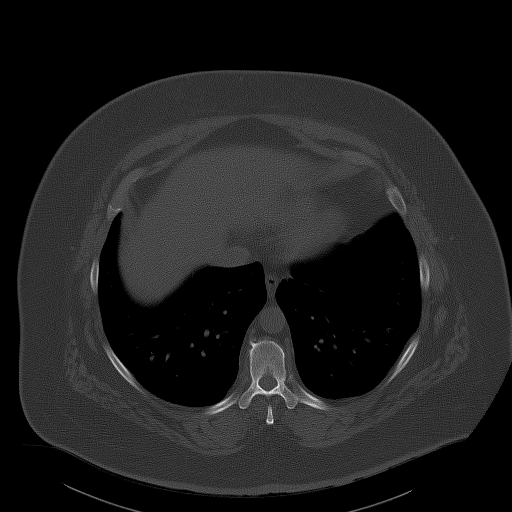
[im 21/28  soft-tissue]
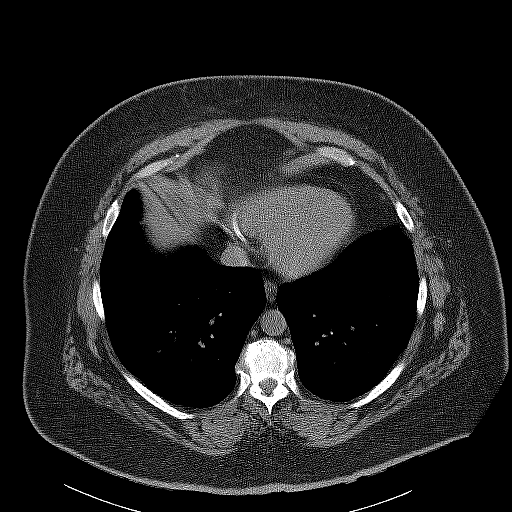
[im 23/28  soft-tissue]
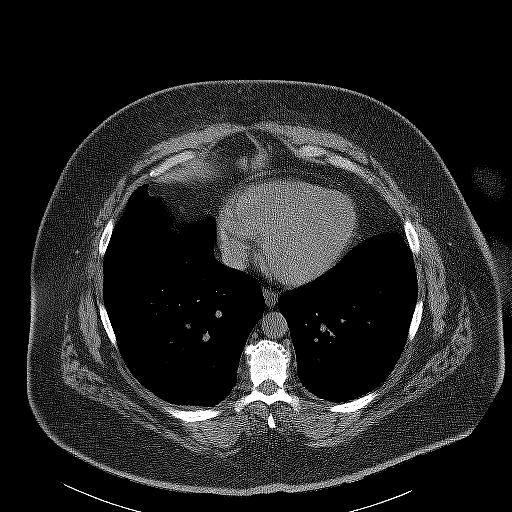
[im 24/28  lung]
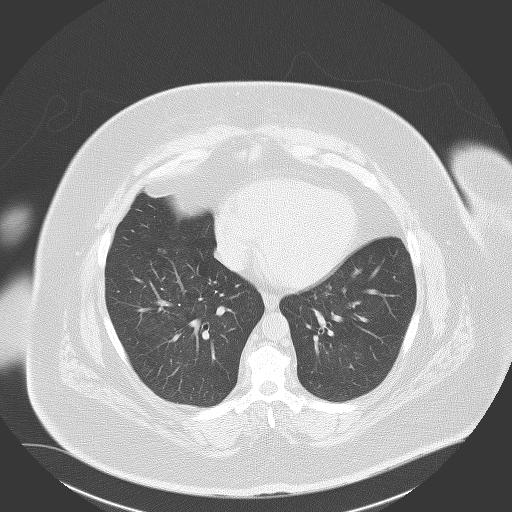
[im 25/28  soft-tissue]
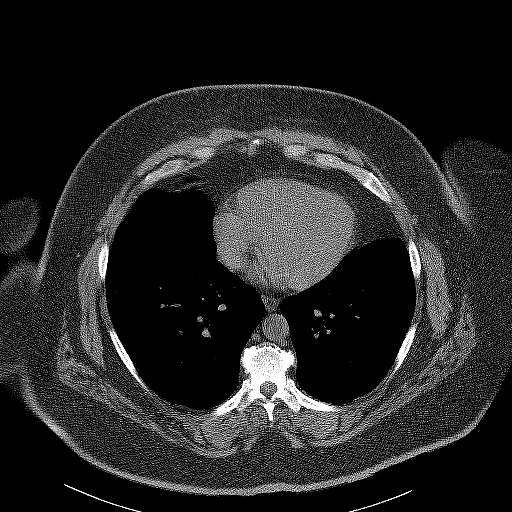
[im 25/28  lung]
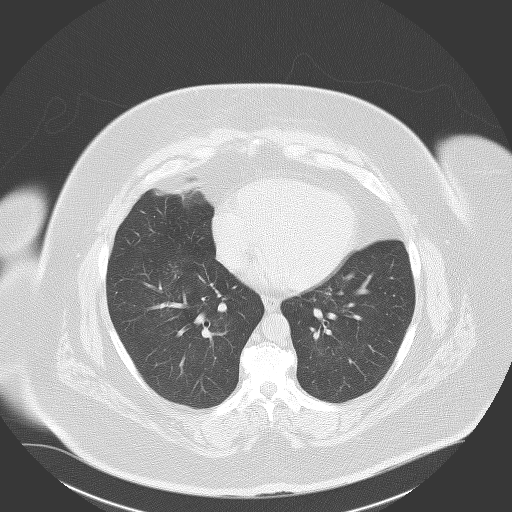
[im 26/28  lung]
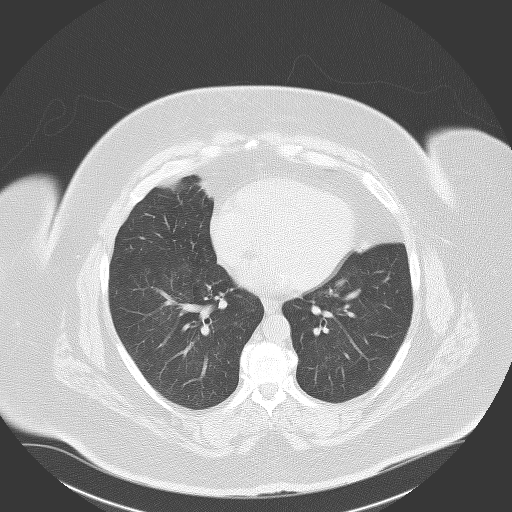
[im 27/28  soft-tissue]
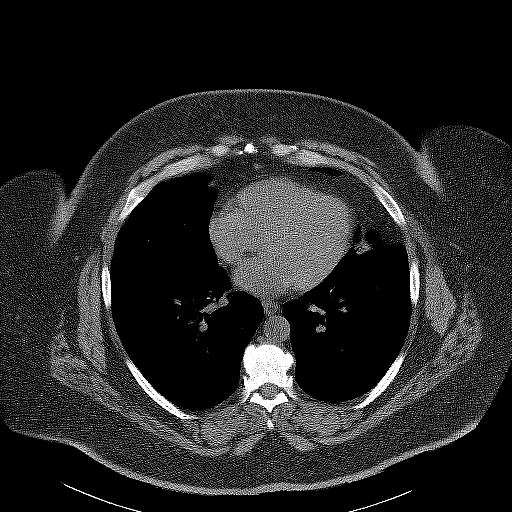
[im 27/28  lung]
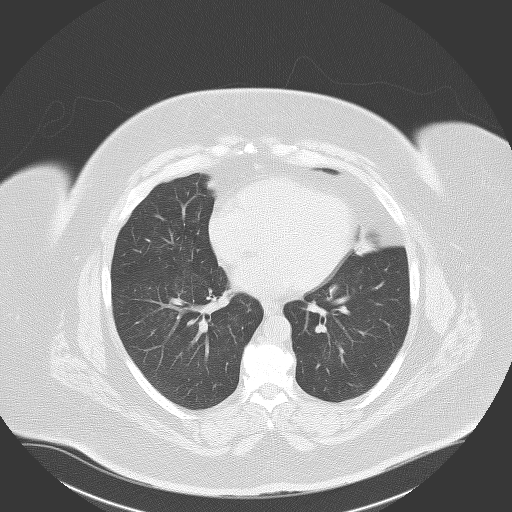

[15 of 28 positions shown; findings below may reference images not displayed]

FINDINGS: Exam is limited for evaluation of entities other than
urinary tract calculi due to lack of oral or intravenous contrast.

 Minimal airspace disease at the anterior left lung base and right
lower lobe.  Improved since [DATE].

Normal heart size without pericardial or pleural effusion.  Distal
right coronary artery atherosclerosis which is age advanced.

Mild hepatomegaly, greater than 18 cm cranial caudal.  Normal
spleen, stomach, pancreas. Cholecystectomy without biliary ductal
dilatation.  Normal adrenal glands.  Lower pole right renal
calculus.  Interpolar left renal lesion measures fluid density and
1.2 cm on image 28, likely a cyst.  Phleboliths in the pelvis. No
hydroureter or ureteric calculi.  Age advanced aortic
atherosclerosis.  Node adjacent the left adrenal gland is prominent
but similar back to [0K].

Colonic stool burden suggests constipation.

Normal terminal ileum.  Appendix not visualized. No pelvic
adenopathy.  Normal urinary bladder and uterus, without adnexal
mass or significant free pelvic fluid.

Fat containing pelvic wall hernia anteriorly.  There has been prior
anterior abdominal wall hernia repair.  There is a fat-containing
hernia just cephalad to this.  Injection granulomas within the
anterior abdominal wall. No acute osseous abnormality.

A punctate left renal collecting systems stone is only apparent on
coronal reformatted images.
IMPRESSION: 1.  Bilateral renal collecting system calculi, without ureteric
stone or hydroureter.
2.  No other explanation for hematuria.
3.  Improved bibasilar airspace disease since [DATE].
[DATE].  Ventral abdominal wall hernia repair with both superior and
inferior fat containing hernias.
5. Possible constipation.

6. Age advanced coronary artery atherosclerosis.  Recommend
assessment of coronary risk factors and consideration of medical
therapy.   .

## 2012-02-15 MED ORDER — WARFARIN SODIUM 10 MG PO TABS
20.0000 mg | ORAL_TABLET | Freq: Once | ORAL | Status: AC
  Administered 2012-02-15: 20 mg via ORAL
  Filled 2012-02-15: qty 2

## 2012-02-15 NOTE — Progress Notes (Signed)
Bloody urine clearing up lighter pink than the morning urine, more amber color. Post void residual 0. F/U with plan of care.

## 2012-02-15 NOTE — Progress Notes (Addendum)
Patient having pinkish/bloody urine with small amount of clot, patient denies any pain/or distress. Dr Janee Morn and pharmacist notified, and stated to continue coumadin and lovenox. UA and culture ordered and sent, I/O cath done for urine sample. Will continue to assess patient.

## 2012-02-15 NOTE — Consult Note (Signed)
Consult: gross hematuria Referring MD: Dr. Ramiro Harvest  History of Present Illness:  51 yo admitted for hospital acquired pneumonia and PE. She is on Lovenox and coumadin. This AM she voided and noted red urine and small clots. She feel like her urine is getting clearer throughout the day. She is voiding with a good stream. No bothersome freq or urgency. No dysuria. No flank pain.  She is a past smoker with a 45 yr pack history. She has not been exposed to chemo, XRT or dye/solvents.  She has a remote h/o passing a kidney stone.  Past Medical History  Diagnosis Date  . Asthma   . Hypertension   . CAD (coronary artery disease)     Cath 2010 with DES x 1 RCA  . Hyperlipidemia   . GERD (gastroesophageal reflux disease)   . Depression   . Diabetes mellitus 2010  . Arthritis     knees  . Abnormal uterine bleeding     last menstrual period 3 yrs ago.to be evalauated for uterine polyp  . Sleep apnea     never had sleep study  . Aortic stenosis, moderate     per 11/2011 echo   Past Surgical History  Procedure Date  . Tubal ligation   . Carpal tunnel release     Bilateral  . Cholecystectomy   . Hernia repair   . Cardiac catheterization   . Coronary angioplasty     2010  . Knee arthroplasty 02/03/2012    Procedure: COMPUTER ASSISTED TOTAL KNEE ARTHROPLASTY;  Surgeon: [name], MD;  Location: [facility] OR;  Service: Orthopedics;  Laterality: Right;  Right total knee arthroplasty    Home Medications:  Prescriptions prior to admission  Medication Sig Dispense Refill  . albuterol (PROVENTIL HFA;VENTOLIN HFA) 108 (90 BASE) MCG/ACT inhaler Inhale 1 puff into the lungs every 6 (six) hours as needed. Shortness of breath.       Marland Kitchen atorvastatin (LIPITOR) 40 MG tablet Take 40 mg by mouth at bedtime.       . clopidogrel (PLAVIX) 75 MG tablet Take 75 mg by mouth daily.        . diclofenac (VOLTAREN) 25 MG EC tablet Take 25 mg by mouth 2 (two) times daily.        Marland Kitchen escitalopram  (LEXAPRO) 20 MG tablet Take 20 mg by mouth daily.        . Fluticasone-Salmeterol (ADVAIR) 250-50 MCG/DOSE AEPB Inhale 1 puff into the lungs 2 (two) times daily.       Marland Kitchen gabapentin (NEURONTIN) 300 MG capsule Take 300 mg by mouth daily. Patient states she takes it once daily. Per patient.       Marland Kitchen glipiZIDE (GLUCOTROL) 5 MG tablet Take 5 mg by mouth 2 (two) times daily before a meal.        . hydrochlorothiazide (HYDRODIURIL) 25 MG tablet Take 25 mg by mouth daily.      Marland Kitchen ibuprofen (ADVIL,MOTRIN) 200 MG tablet Take 200 mg by mouth every 6 (six) hours as needed. For pain      . losartan (COZAAR) 100 MG tablet Take 100 mg by mouth daily.      . methocarbamol (ROBAXIN) 500 MG tablet Take 1 tablet (500 mg total) by mouth every 6 (six) hours as needed.  60 tablet  0  . oxyCODONE-acetaminophen (ROXICET) 5-325 MG per tablet Take 1-2 tablets by mouth every 4 (four) hours as needed for pain.  90 tablet  0   Allergies:  Allergies  Allergen Reactions  . Hydrocodone     itching    Family History  Problem Relation Age of Onset  . Cancer Mother     Breast  . COPD Father   . Cancer Brother     Sinus   Social History:  reports that she has quit smoking. Her smoking use included Cigarettes. She has a 45 pack-year smoking history. She does not have any smokeless tobacco history on file. She reports that she does not drink alcohol or use illicit drugs.  ROS: A complete review of systems was performed.  All systems are negative except for pertinent findings as noted. @ROS @  Physical Exam:  Vital signs in last 24 hours: Temp:  [97.7 F (36.5 C)-99 F (37.2 C)] 97.7 F (36.5 C) (02/17 1249) Pulse Rate:  [80-88] 80  (02/17 1249) Resp:  [18] 18  (02/17 1249) BP: (115-138)/(68-78) 138/78 mmHg (02/17 1249) SpO2:  [90 %-95 %] 92 % (02/17 1249) Weight:  [152.091 kg (335 lb 4.8 oz)] 152.091 kg (335 lb 4.8 oz) (02/17 0703) General:  Alert and oriented, No acute distress HEENT: Normocephalic,  atraumatic Neck: No JVD or lymphadenopathy Cardiovascular: Regular rate and rhythm Lungs: Normal rate, effort Abdomen: Soft, nontender, nondistended, no abdominal masses Back: No CVA tenderness Extremities: No edema, incision over right knee C/D/I Neurologic: Grossly intact  Laboratory Data:  Results for orders placed during the hospital encounter of 02/08/12 (from the past 24 hour(s))  GLUCOSE, CAPILLARY     Status: Abnormal   Collection Time   02/14/12  5:12 PM      Component Value Range   Glucose-Capillary 126 (*) 70 - 99 (mg/dL)   Comment 1 Notify RN     Comment 2 Documented in Chart    GLUCOSE, CAPILLARY     Status: Abnormal   Collection Time   02/14/12  9:11 PM      Component Value Range   Glucose-Capillary 142 (*) 70 - 99 (mg/dL)  PROTIME-INR     Status: Abnormal   Collection Time   02/15/12  5:42 AM      Component Value Range   Prothrombin Time 15.6 (*) 11.6 - 15.2 (seconds)   INR 1.21  0.00 - 1.49   CBC     Status: Abnormal   Collection Time   02/15/12  5:42 AM      Component Value Range   WBC 8.1  4.0 - 10.5 (K/uL)   RBC 3.79 (*) 3.87 - 5.11 (MIL/uL)   Hemoglobin 10.3 (*) 12.0 - 15.0 (g/dL)   HCT 40.9 (*) 81.1 - 46.0 (%)   MCV 84.7  78.0 - 100.0 (fL)   MCH 27.2  26.0 - 34.0 (pg)   MCHC 32.1  30.0 - 36.0 (g/dL)   RDW 91.4  78.2 - 95.6 (%)   Platelets 259  150 - 400 (K/uL)  BASIC METABOLIC PANEL     Status: Abnormal   Collection Time   02/15/12  5:42 AM      Component Value Range   Sodium 133 (*) 135 - 145 (mEq/L)   Potassium 4.1  3.5 - 5.1 (mEq/L)   Chloride 95 (*) 96 - 112 (mEq/L)   CO2 31  19 - 32 (mEq/L)   Glucose, Bld 123 (*) 70 - 99 (mg/dL)   BUN 16  6 - 23 (mg/dL)   Creatinine, Ser 2.13  0.50 - 1.10 (mg/dL)   Calcium 9.0  8.4 - 08.6 (mg/dL)   GFR calc non Af Denyse Dago  73 (*) >90 (mL/min)   GFR calc Af Amer 85 (*) >90 (mL/min)  URINALYSIS, ROUTINE W REFLEX MICROSCOPIC     Status: Abnormal   Collection Time   02/15/12  9:10 AM      Component Value Range    Color, Urine RED (*) YELLOW    APPearance CLOUDY (*) CLEAR    Specific Gravity, Urine 1.027  1.005 - 1.030    pH 7.0  5.0 - 8.0    Glucose, UA NEGATIVE  NEGATIVE (mg/dL)   Hgb urine dipstick LARGE (*) NEGATIVE    Bilirubin Urine NEGATIVE  NEGATIVE    Ketones, ur NEGATIVE  NEGATIVE (mg/dL)   Protein, ur 30 (*) NEGATIVE (mg/dL)   Urobilinogen, UA 0.2  0.0 - 1.0 (mg/dL)   Nitrite NEGATIVE  NEGATIVE    Leukocytes, UA SMALL (*) NEGATIVE   URINE MICROSCOPIC-ADD ON     Status: Abnormal   Collection Time   02/15/12  9:10 AM      Component Value Range   Squamous Epithelial / LPF RARE  RARE    WBC, UA 0-2  <3 (WBC/hpf)   RBC / HPF TOO NUMEROUS TO COUNT  <3 (RBC/hpf)   Bacteria, UA FEW (*) RARE    No results found for this or any previous visit (from the past 240 hour(s)). Creatinine:  Basename 02/15/12 0542 02/13/12 0455 02/12/12 0442 02/11/12 0500 02/10/12 0900 02/10/12 0348 02/09/12 0150  CREATININE 0.89 0.90 1.01 0.96 0.90 0.94 1.02   CT A/P - I reviewed all images. No clots in bladder. Kidneys normal apart from RLP stone. No hydro or obvious mass.   Impression/Assessment:  Gross hematuria - UA doesn't look infected. Pt without dysuria, fever or WBC. She is on Maxipime. Cr normal Nephrolithiasis - no intervention needed  Plan:  No urologic intervention needed at the present. Continue to monitor. Hopefully urine will clear. Hematuria could be from stone but may be from cancer given smoking history. She will need additional outpatient workup (cystoscopy and cytology). I gave her my card and asked her to f/u with me. If she continues to void normally and remains stable she can go home from a urologic pt of view.  Antony Haste 02/15/2012, 4:15 PM

## 2012-02-15 NOTE — Progress Notes (Signed)
Patient last void still bloody unalike the 4pm urine that is clearer; no C/O any distress. Will continue to assess patient/urine.

## 2012-02-15 NOTE — Progress Notes (Signed)
Subjective: Patient states SOB improving. Patient states had blood clots last night and gross hematuria this AM. Objective: Vital signs in last 24 hours: Temp:  [97.7 F (36.5 C)-99 F (37.2 C)] 97.7 F (36.5 C) (02/17 1249) Pulse Rate:  [80-88] 80  (02/17 1249) Resp:  [18] 18  (02/17 1249) BP: (115-138)/(68-78) 138/78 mmHg (02/17 1249) SpO2:  [90 %-95 %] 92 % (02/17 1249) Weight:  [152.091 kg (335 lb 4.8 oz)] 152.091 kg (335 lb 4.8 oz) (02/17 0703) Last BM Date: 02/15/12 Intake/Output from previous day: 02/16 0701 - 02/17 0700 In: 1850 [P.O.:1800; IV Piggyback:50] Out: 1000 [Urine:1000] Intake/Output this shift: Total I/O In: 840 [P.O.:840] Out: 300 [Urine:300]    General Appearance:    Alert, cooperative, obese, in no distress  Lungs:     Decreased BS at bases, respirations unlabored   Heart:    Regular rate and rhythm, S1 and S2 normal, no murmur, rub   or gallop  Abdomen:     Soft, non-tender, bowel sounds active all four quadrants,    no masses, no organomegaly. No CVA TTP.  Extremities:   RLE +1-2 edema, LLE. No edema and no cyanosis. Wound site c/d/i staples in place.  Neurologic:   CNII-XII intact, nonfocal       Weight change:   Intake/Output Summary (Last 24 hours) at 02/15/12 1344 Last data filed at 02/15/12 1250  Gross per 24 hour  Intake   2090 ml  Output   1300 ml  Net    790 ml    Lab Results:   [facility] 02/15/12 0542 02/13/12 0455  NA 133* 139  K 4.1 4.0  CL 95* 98  CO2 31 35*  GLUCOSE 123* 108*  BUN 16 14  CREATININE 0.89 0.90  CALCIUM 9.0 9.3    Basename 02/15/12 0542 02/13/12 0455  WBC 8.1 7.6  HGB 10.3* 9.9*  HCT 32.1* 31.3*  PLT 259 290  MCV 84.7 86.0   PT/INR  Basename 02/15/12 0542 02/14/12 0529  LABPROT 15.6* 15.5*  INR 1.21 1.20   ABG No results found for this basename: PHART:2,PCO2:2,PO2:2,HCO3:2 in the last 72 hours  Micro Results: No results found for this or any previous visit (from the past 240  hour(s)). Studies/Results:  ECHO Study Conclusions  - Left ventricle: The cavity size was normal. Wall thickness was normal. Systolic function was normal. The estimated ejection fraction was in the range of 60% to 65%. Although no diagnostic regional wall motion abnormality was identified, this possibility cannot be completely excluded on the basis of this study. Features are consistent with a pseudonormal left ventricular filling pattern, with concomitant abnormal relaxation and increased filling pressure (grade 2 diastolic dysfunction).  LE DOPPLERS Summary:  - No obvious evidence of deep vein or superficial thrombosis involving the right lower extremity and left lower extremity. - No evidence of Baker's cyst on the right or left. Other specific details can be found in the table(s) above  No results found. Medications Scheduled Meds:    . acetaminophen  650 mg Oral Once  . ceFEPime (MAXIPIME) IV  1 g Intravenous Q8H  . clopidogrel  75 mg Oral Daily  . dextromethorphan-guaiFENesin  1 tablet Oral BID  . enoxaparin (LOVENOX) injection  1 mg/kg Subcutaneous Q12H  . escitalopram  20 mg Oral Daily  . Fluticasone-Salmeterol  1 puff Inhalation BID  . furosemide  40 mg Oral BID  . gabapentin  300 mg Oral Daily  . glipiZIDE  5 mg Oral BID AC  .  insulin aspart  0-5 Units Subcutaneous QHS  . insulin aspart  0-9 Units Subcutaneous TID WC  . losartan  100 mg Oral Daily  . pantoprazole  40 mg Oral q morning - 10a  . potassium chloride  40 mEq Oral QPC breakfast  . rosuvastatin  20 mg Oral Daily  . senna  1 tablet Oral BID  . warfarin  15 mg Oral ONCE-1800   Continuous Infusions:  PRN Meds:.acetaminophen, acetaminophen, albuterol, diphenhydrAMINE, levalbuterol, methocarbamol, ondansetron (ZOFRAN) IV, ondansetron, oxyCODONE-acetaminophen Assessment/Plan:   LOS: 7 days  PE (pulmonary embolism)  As discussed above, morbidly obese patient status post recent R. total knee  arthroplasty presenting with the worsening shortness of breath and CT angiogram with a small PE.  -continue Lovenox and Coumadin till INR therapeutic with close monitoring for bleeding inhouse given her anemia. INR now slowly  rising. Coumadin dose increased. Follow. Monitor closely with new onset hematuria.  Marland KitchenHAP (hospital-acquired pneumonia)  - Clinically improving. Pt remaining afebrile, no leukocytosis and hemodynamically stable - Continue cefepime D7 follow.  Hematuria UA with RBC tntc. Urine cultures pending. CT abd and pelvis pending stone protocol. Check a postvoid bladder scan. Monitor closely with anticoagulation. Urology consult for further eval and rxcs. H/H stable  .Diastolic CHF Pt with improved SOB.  Continue lasix at current dose.  .Anemia  guaiac neg x1, possibly post op anemia -Hgb stable. S/p  2 units of PRBC. Follow h/h.  Marland KitchenHypertension  -Continue outpatient medications   .GERD (gastroesophageal reflux disease)  - PPI  .DM (diabetes mellitus)  -Continue oral hypoglycemics, monitor Accu-Cheks and cover with sliding scale insulin.  .Asthma  -Stable, continue bronchodilators  .Hypokalemia  . resolved .History of coronary artery disease  -She was chest pain-free, continue outpatient medications.  . Status post recent right total knee arthroplasty  .S/P TKA - per ortho, appreciate f/u   [facility] 02/15/2012, 1:44 PM

## 2012-02-15 NOTE — Progress Notes (Signed)
ANTICOAGULATION CONSULT NOTE - Follow Up Consult  Pharmacy Consult for Lovenox/Warfarin  Indication: New PE  Allergies  Allergen Reactions  . Hydrocodone     itching    Patient Measurements: Height: 5\' 3"  (160 cm) Weight: 335 lb 4.8 oz (152.091 kg) IBW/kg (Calculated) : 52.4   Vital Signs: Temp: 97.7 F (36.5 C) (02/17 1249) Temp src: Oral (02/17 1249) BP: 138/78 mmHg (02/17 1249) Pulse Rate: 80  (02/17 1249)  Labs:  Basename 02/15/12 0542 02/14/12 0529 02/13/12 0455  HGB 10.3* -- 9.9*  HCT 32.1* -- 31.3*  PLT 259 -- 290  APTT -- -- --  LABPROT 15.6* 15.5* 14.5  INR 1.21 1.20 1.11  HEPARINUNFRC -- -- --  CREATININE 0.89 -- 0.90  CKTOTAL -- -- --  CKMB -- -- --  TROPONINI -- -- --   Estimated Creatinine Clearance: 107.7 ml/min (by C-G formula based on Cr of 0.89).   Medications:  Scheduled:     . acetaminophen  650 mg Oral Once  . ceFEPime (MAXIPIME) IV  1 g Intravenous Q8H  . clopidogrel  75 mg Oral Daily  . dextromethorphan-guaiFENesin  1 tablet Oral BID  . enoxaparin (LOVENOX) injection  1 mg/kg Subcutaneous Q12H  . escitalopram  20 mg Oral Daily  . Fluticasone-Salmeterol  1 puff Inhalation BID  . furosemide  40 mg Oral BID  . gabapentin  300 mg Oral Daily  . glipiZIDE  5 mg Oral BID AC  . insulin aspart  0-5 Units Subcutaneous QHS  . insulin aspart  0-9 Units Subcutaneous TID WC  . losartan  100 mg Oral Daily  . pantoprazole  40 mg Oral q morning - 10a  . potassium chloride  40 mEq Oral QPC breakfast  . rosuvastatin  20 mg Oral Daily  . senna  1 tablet Oral BID  . warfarin  15 mg Oral ONCE-1800   Infusions:   PRN: acetaminophen, acetaminophen, albuterol, diphenhydrAMINE, levalbuterol, methocarbamol, ondansetron (ZOFRAN) IV, ondansetron, oxyCODONE-acetaminophen  Assessment:  51 yo F on Lovenox / Warfarin for acute PE on day #7 overlap.  RN reports gross hematuria.  MD notified and plan to continue treatment of PE for another day while waiting  for studies and urology consult.  Patient's INR is very low but has been receiving large doses of Lovenox (due to weight) and also on Plavix which may be contributing to bleeding.  CBC ok.  Minimal INR response to date despite large coumadin doses.  Goal of Therapy:  INR 2-3   Plan:  1) Continue Lovenox 160mg  SQ q12h. F/u plan for continued anticoagulation tomorrow. 2) Warfarin 20mg  po once today since plan today is still to treat.  Clance Boll, PharmD Pager: [phone] 02/15/2012 1:53 PM

## 2012-02-16 LAB — BASIC METABOLIC PANEL
Calcium: 9.3 mg/dL (ref 8.4–10.5)
Creatinine, Ser: 0.96 mg/dL (ref 0.50–1.10)
GFR calc Af Amer: 77 mL/min — ABNORMAL LOW (ref >90)
GFR calc non Af Amer: 67 mL/min — ABNORMAL LOW (ref >90)

## 2012-02-16 LAB — URINE CULTURE

## 2012-02-16 LAB — CBC
MCH: 27.2 pg (ref 26.0–34.0)
MCV: 86 fL (ref 78.0–100.0)
Platelets: 242 10*3/uL (ref 150–400)
RDW: 14.6 % (ref 11.5–15.5)

## 2012-02-16 LAB — GLUCOSE, CAPILLARY: Glucose-Capillary: 123 mg/dL — ABNORMAL HIGH (ref 70–99)

## 2012-02-16 MED ORDER — WARFARIN SODIUM 10 MG PO TABS
20.0000 mg | ORAL_TABLET | Freq: Once | ORAL | Status: AC
  Administered 2012-02-16: 20 mg via ORAL
  Filled 2012-02-16: qty 2

## 2012-02-16 NOTE — Progress Notes (Signed)
ANTICOAGULATION CONSULT NOTE - Follow Up Consult  Pharmacy Consult for Lovenox/Warfarin  Indication: New PE  Allergies  Allergen Reactions  . Hydrocodone     itching    Patient Measurements: Height: 5\' 3"  (160 cm) Weight: 335 lb 1.6 oz (152 kg) IBW/kg (Calculated) : 52.4   Vital Signs: Temp: 98.7 F (37.1 C) (02/18 1427) Temp src: Oral (02/18 1427) BP: 122/74 mmHg (02/18 1427) Pulse Rate: 91  (02/18 1427)  Labs:  Basename 02/16/12 0405 02/15/12 0542 02/14/12 0529  HGB 10.5* 10.3* --  HCT 33.2* 32.1* --  PLT 242 259 --  APTT -- -- --  LABPROT 17.8* 15.6* 15.5*  INR 1.44 1.21 1.20  HEPARINUNFRC -- -- --  CREATININE 0.96 0.89 --  CKTOTAL -- -- --  CKMB -- -- --  TROPONINI -- -- --   Estimated Creatinine Clearance: 99.8 ml/min (by C-G formula based on Cr of 0.96).   Medications:  Scheduled:     . acetaminophen  650 mg Oral Once  . ceFEPime (MAXIPIME) IV  1 g Intravenous Q8H  . clopidogrel  75 mg Oral Daily  . dextromethorphan-guaiFENesin  1 tablet Oral BID  . enoxaparin (LOVENOX) injection  1 mg/kg Subcutaneous Q12H  . escitalopram  20 mg Oral Daily  . Fluticasone-Salmeterol  1 puff Inhalation BID  . furosemide  40 mg Oral BID  . gabapentin  300 mg Oral Daily  . glipiZIDE  5 mg Oral BID AC  . insulin aspart  0-5 Units Subcutaneous QHS  . insulin aspart  0-9 Units Subcutaneous TID WC  . losartan  100 mg Oral Daily  . pantoprazole  40 mg Oral q morning - 10a  . potassium chloride  40 mEq Oral QPC breakfast  . rosuvastatin  20 mg Oral Daily  . senna  1 tablet Oral BID  . warfarin  20 mg Oral ONCE-1800   Infusions:   PRN: acetaminophen, acetaminophen, albuterol, diphenhydrAMINE, levalbuterol, methocarbamol, ondansetron (ZOFRAN) IV, ondansetron, oxyCODONE-acetaminophen  Assessment:  51 yo F on Lovenox / Warfarin for acute PE on day #7 overlap.  Patient with slightly pink urine throughout the past 12 hrs per RN  Notes this AM.  MD notified and plan to  continue treatment of PE for another day while waiting for studies and urology consult.  Patient's INR is very low but has been receiving large doses of Lovenox (due to weight) and also on Plavix which may be contributing to bleeding.  CBC ok.  INR still subtherapeutic but did finally increase some  Doses so far of warfarin given: 7.5, 7.5, 7.5, 10, 10, 15, 15, 20mg   Goal of Therapy:  INR 2-3   Plan:  1) Continue Lovenox 160mg  SQ q12h.   2) repeat Warfarin 20mg  today   Hessie Knows, PharmD, BCPS pager [phone] 02/16/2012 2:57 PM

## 2012-02-16 NOTE — Progress Notes (Signed)
Subjective: Patient states SOB improving. Patient states still with some clots in urine. Objective: Vital signs in last 24 hours: Temp:  [97.7 F (36.5 C)-98.7 F (37.1 C)] 98.7 F (37.1 C) (02/18 1427) Pulse Rate:  [86-91] 91  (02/18 1427) Resp:  [18] 18  (02/18 1427) BP: (121-136)/(62-74) 122/74 mmHg (02/18 1427) SpO2:  [93 %-95 %] 93 % (02/18 1427) Weight:  [152 kg (335 lb 1.6 oz)] 152 kg (335 lb 1.6 oz) (02/18 0426) Last BM Date: 02/15/12 Intake/Output from previous day: 02/17 0701 - 02/18 0700 In: 1970 [P.O.:1920; IV Piggyback:50] Out: 1400 [Urine:1400] Intake/Output this shift: Total I/O In: 480 [P.O.:480] Out: 1200 [Urine:1200]    General Appearance:    Alert, cooperative, obese, in no distress  Lungs:     Decreased BS at bases, respirations unlabored   Heart:    Regular rate and rhythm, S1 and S2 normal, no murmur, rub   or gallop  Abdomen:     Soft, non-tender, bowel sounds active all four quadrants,    no masses, no organomegaly. No CVA TTP.  Extremities:   RLE +1-2 edema, LLE. No edema and no cyanosis. Wound site c/d/i staples in place.  Neurologic:   CNII-XII intact, nonfocal       Weight change:   Intake/Output Summary (Last 24 hours) at 02/16/12 1737 Last data filed at 02/16/12 1300  Gross per 24 hour  Intake   1610 ml  Output   1850 ml  Net   -240 ml    Lab Results:   [facility] 02/16/12 0405 02/15/12 0542  NA 136 133*  K 4.3 4.1  CL 97 95*  CO2 31 31  GLUCOSE 110* 123*  BUN 18 16  CREATININE 0.96 0.89  CALCIUM 9.3 9.0    Basename 02/16/12 0405 02/15/12 0542  WBC 8.9 8.1  HGB 10.5* 10.3*  HCT 33.2* 32.1*  PLT 242 259  MCV 86.0 84.7   PT/INR  Basename 02/16/12 0405 02/15/12 0542  LABPROT 17.8* 15.6*  INR 1.44 1.21   ABG No results found for this basename: PHART:2,PCO2:2,PO2:2,HCO3:2 in the last 72 hours  Micro Results: Recent Results (from the past 240 hour(s))  URINE CULTURE     Status: Normal   Collection Time   02/15/12   9:10 AM      Component Value Range Status Comment   Specimen Description URINE, CATHETERIZED   Final    Special Requests NONE   Final    Culture  Setup Time [id number]   Final    Colony Count NO GROWTH   Final    Culture NO GROWTH   Final    Report Status 02/16/2012 FINAL   Final    Studies/Results:  ECHO Study Conclusions  - Left ventricle: The cavity size was normal. Wall thickness was normal. Systolic function was normal. The estimated ejection fraction was in the range of 60% to 65%. Although no diagnostic regional wall motion abnormality was identified, this possibility cannot be completely excluded on the basis of this study. Features are consistent with a pseudonormal left ventricular filling pattern, with concomitant abnormal relaxation and increased filling pressure (grade 2 diastolic dysfunction).  LE DOPPLERS Summary:  - No obvious evidence of deep vein or superficial thrombosis involving the right lower extremity and left lower extremity. - No evidence of Baker's cyst on the right or left. Other specific details can be found in the table(s) above  Ct Abdomen Pelvis Wo Contrast  02/15/2012  *RADIOLOGY REPORT*  Clinical Data:  History  of urinary tract stones and painless hematuria.  On blood thinners.  History of abnormal uterine bleeding.  Pulmonary embolism.  Hypertension.  Diabetes.  CT ABDOMEN AND PELVIS WITHOUT CONTRAST (CT UROGRAM)  Technique: Contiguous axial images of the abdomen and pelvis without oral or intravenous contrast were obtained.  Comparison: Plain films of 10/03/2011.  Most recent CT of 12/22/2006.  Findings:  Exam is limited for evaluation of entities other than urinary tract calculi due to lack of oral or intravenous contrast.   Minimal airspace disease at the anterior left lung base and right lower lobe.  Improved since 02/08/2012.  Normal heart size without pericardial or pleural effusion.  Distal right coronary artery atherosclerosis which is  age advanced.  Mild hepatomegaly, greater than 18 cm cranial caudal.  Normal spleen, stomach, pancreas. Cholecystectomy without biliary ductal dilatation.  Normal adrenal glands.  Lower pole right renal calculus.  Interpolar left renal lesion measures fluid density and 1.2 cm on image 28, likely a cyst.  Phleboliths in the pelvis. No hydroureter or ureteric calculi.  Age advanced aortic atherosclerosis.  Node adjacent the left adrenal gland is prominent but similar back to 2007.  Colonic stool burden suggests constipation.  Normal terminal ileum.  Appendix not visualized. No pelvic adenopathy.  Normal urinary bladder and uterus, without adnexal mass or significant free pelvic fluid.  Fat containing pelvic wall hernia anteriorly.  There has been prior anterior abdominal wall hernia repair.  There is a fat-containing hernia just cephalad to this.  Injection granulomas within the anterior abdominal wall. No acute osseous abnormality.  A punctate left renal collecting systems stone is only apparent on coronal reformatted images.  IMPRESSION:  1.  Bilateral renal collecting system calculi, without ureteric stone or hydroureter. 2.  No other explanation for hematuria. 3.  Improved bibasilar airspace disease since 02/08/2012. 4.  Ventral abdominal wall hernia repair with both superior and inferior fat containing hernias. 5. Possible constipation.  6. Age advanced coronary artery atherosclerosis.  Recommend assessment of coronary risk factors and consideration of medical therapy.   .  Original Report Authenticated By: Consuello Bossier, M.D.   Medications Scheduled Meds:    . acetaminophen  650 mg Oral Once  . ceFEPime (MAXIPIME) IV  1 g Intravenous Q8H  . clopidogrel  75 mg Oral Daily  . dextromethorphan-guaiFENesin  1 tablet Oral BID  . enoxaparin (LOVENOX) injection  1 mg/kg Subcutaneous Q12H  . escitalopram  20 mg Oral Daily  . Fluticasone-Salmeterol  1 puff Inhalation BID  . furosemide  40 mg Oral BID  .  gabapentin  300 mg Oral Daily  . glipiZIDE  5 mg Oral BID AC  . insulin aspart  0-5 Units Subcutaneous QHS  . insulin aspart  0-9 Units Subcutaneous TID WC  . losartan  100 mg Oral Daily  . pantoprazole  40 mg Oral q morning - 10a  . potassium chloride  40 mEq Oral QPC breakfast  . rosuvastatin  20 mg Oral Daily  . senna  1 tablet Oral BID  . warfarin  20 mg Oral ONCE-1800   Continuous Infusions:  PRN Meds:.acetaminophen, acetaminophen, albuterol, diphenhydrAMINE, levalbuterol, methocarbamol, ondansetron (ZOFRAN) IV, ondansetron, oxyCODONE-acetaminophen Assessment/Plan:   LOS: 8 days  PE (pulmonary embolism)  As discussed above, morbidly obese patient status post recent R. total knee arthroplasty presenting with the worsening shortness of breath and CT angiogram with a small PE.  -continue Lovenox and Coumadin till INR therapeutic with close monitoring for bleeding inhouse given her anemia.  INR now slowly  rising. Coumadin dose increased. Follow. Monitor closely with new onset hematuria. Follow  .HAP (hospital-acquired pneumonia)  - Clinically improving. Pt remaining afebrile, no leukocytosis and hemodynamically stable - Continue cefepime D8 follow. D/C cefipime after today's dose.  Hematuria UA with RBC tntc. Urine cultures pending. CT abd and pelvis with renal calculi but no obstructing stones. Patient seen by urology recommendation is to monitor for now with outpatient follow up.  .Diastolic CHF Pt with improved SOB.  Continue lasix at current dose.  .Anemia  guaiac neg x1, possibly post op anemia -Hgb stable. S/p  2 units of PRBC. Follow h/h.  Marland KitchenHypertension  -Continue outpatient medications   .GERD (gastroesophageal reflux disease)  - PPI  .DM (diabetes mellitus)  -Continue oral hypoglycemics, monitor Accu-Cheks and cover with sliding scale insulin.  .Asthma  -Stable, continue bronchodilators  .Hypokalemia  . resolved .History of coronary artery disease  -She was  chest pain-free, continue outpatient medications.  . Status post recent right total knee arthroplasty  .S/P TKA - per ortho, appreciate f/u. Per ortho staples to be removed soon.  Aditi Rovira 02/16/2012, 5:37 PM

## 2012-02-16 NOTE — Progress Notes (Signed)
MD aware that patient still has dark pink urine with occasional clots,ordered to still give the Coumadin 20mg  tab.

## 2012-02-16 NOTE — Progress Notes (Signed)
02-16-12 0700 urine has been slightly pink throughout the past 12 hours c an occassional tiny mucous clot.  No flank pain, vss, u/o qs

## 2012-02-16 NOTE — Progress Notes (Signed)
Physical Therapy Treatment Patient Details Name: [name] MRN: [id number] DOB: 1961/08/17 Today's Date: 02/16/2012  PT Assessment/Plan  PT - Assessment/Plan Comments on Treatment Session: Pt doing well with mobility and ambulated in hallway and performed stairs.  Pt hopes to d/c home very soon.   PT Plan: Discharge plan remains appropriate Follow Up Recommendations: Home health PT Equipment Recommended: None recommended by PT PT Goals  Acute Rehab PT Goals PT Goal: Sit to Stand - Progress: Met PT Goal: Stand to Sit - Progress: Met PT Goal: Ambulate - Progress: Progressing toward goal Pt will Go Up / Down Stairs:  (pt reports only 2 stairs at home) PT Goal: Up/Down Stairs - Progress: Met  PT Treatment Precautions/Restrictions  Precautions Precautions: Knee Required Braces or Orthoses: No Restrictions Weight Bearing Restrictions: No RLE Weight Bearing: Weight bearing as tolerated Mobility (including Balance) Bed Mobility Bed Mobility: Yes Supine to Sit: 6: Modified independent (Device/Increase time) Transfers Transfers: Yes Sit to Stand: 6: Modified independent (Device/Increase time);With upper extremity assist;From bed Stand to Sit: 6: Modified independent (Device/Increase time);With upper extremity assist;To chair/3-in-1 Ambulation/Gait Ambulation/Gait: Yes Ambulation/Gait Assistance: 5: Supervision Ambulation/Gait Assistance Details (indicate cue type and reason): pt required standing rest break for breathing upon returning to room from performing stairs, 60 feet to stairs and then 60 feet back to room, pt left RW beside recliner than took a couple steps with one armrest for support in front of chair and did well so may try cane with next visit depending on endurance level as pt fatigues quickly Ambulation Distance (Feet): 60 Feet (x2) Assistive device: Rolling walker Gait Pattern: Step-to pattern Stairs Assistance: 4: Min assist Stairs Assistance Details  (indicate cue type and reason): min/guard for safety, pt performed sequence correctly Stair Management Technique: One rail Right;Step to pattern;Sideways Number of Stairs: 2     Exercise    End of Session PT - End of Session Activity Tolerance: Patient tolerated treatment well Patient left: in chair;with call bell in reach General Behavior During Session: [facility] for tasks performed Cognition: [facility] for tasks performed  Azir Muzyka,KATHrine E 02/16/2012, 12:04 PM Pager: [phone]

## 2012-02-17 LAB — PROTIME-INR
INR: 1.65 — ABNORMAL HIGH (ref 0.00–1.49)
Prothrombin Time: 19.8 seconds — ABNORMAL HIGH (ref 11.6–15.2)

## 2012-02-17 LAB — BASIC METABOLIC PANEL
Calcium: 9 mg/dL (ref 8.4–10.5)
GFR calc non Af Amer: 67 mL/min — ABNORMAL LOW (ref >90)
Glucose, Bld: 92 mg/dL (ref 70–99)
Sodium: 138 mEq/L (ref 135–145)

## 2012-02-17 LAB — CBC
Hemoglobin: 10.3 g/dL — ABNORMAL LOW (ref 12.0–15.0)
MCH: 27.5 pg (ref 26.0–34.0)
MCHC: 31.9 g/dL (ref 30.0–36.0)

## 2012-02-17 LAB — GLUCOSE, CAPILLARY
Glucose-Capillary: 202 mg/dL — ABNORMAL HIGH (ref 70–99)
Glucose-Capillary: 90 mg/dL (ref 70–99)

## 2012-02-17 MED ORDER — WARFARIN SODIUM 10 MG PO TABS
20.0000 mg | ORAL_TABLET | Freq: Once | ORAL | Status: AC
  Administered 2012-02-17: 20 mg via ORAL
  Filled 2012-02-17: qty 2

## 2012-02-17 NOTE — Progress Notes (Signed)
ANTICOAGULATION CONSULT NOTE - Follow Up Consult  Pharmacy Consult for Lovenox/Warfarin  Indication: New PE  Allergies  Allergen Reactions  . Hydrocodone     itching    Patient Measurements: Height: 5\' 3"  (160 cm) Weight: 337 lb 11.9 oz (153.2 kg) IBW/kg (Calculated) : 52.4   Vital Signs: Temp: 97.3 F (36.3 C) (02/19 0612) Temp src: Oral (02/19 0612) BP: 127/79 mmHg (02/19 0612) Pulse Rate: 86  (02/19 0612)  Labs:  Basename 02/17/12 0400 02/16/12 0405 02/15/12 0542  HGB 10.3* 10.5* --  HCT 32.3* 33.2* 32.1*  PLT 223 242 259  APTT -- -- --  LABPROT 19.8* 17.8* 15.6*  INR 1.65* 1.44 1.21  HEPARINUNFRC -- -- --  CREATININE 0.96 0.96 0.89  CKTOTAL -- -- --  CKMB -- -- --  TROPONINI -- -- --   Estimated Creatinine Clearance: 100.3 ml/min (by C-G formula based on Cr of 0.96).   Medications:  Scheduled:     . acetaminophen  650 mg Oral Once  . ceFEPime (MAXIPIME) IV  1 g Intravenous Q8H  . clopidogrel  75 mg Oral Daily  . dextromethorphan-guaiFENesin  1 tablet Oral BID  . enoxaparin (LOVENOX) injection  1 mg/kg Subcutaneous Q12H  . escitalopram  20 mg Oral Daily  . Fluticasone-Salmeterol  1 puff Inhalation BID  . furosemide  40 mg Oral BID  . gabapentin  300 mg Oral Daily  . glipiZIDE  5 mg Oral BID AC  . insulin aspart  0-5 Units Subcutaneous QHS  . insulin aspart  0-9 Units Subcutaneous TID WC  . losartan  100 mg Oral Daily  . pantoprazole  40 mg Oral q morning - 10a  . potassium chloride  40 mEq Oral QPC breakfast  . rosuvastatin  20 mg Oral Daily  . senna  1 tablet Oral BID  . warfarin  20 mg Oral ONCE-1800   Infusions:   PRN: acetaminophen, acetaminophen, albuterol, diphenhydrAMINE, levalbuterol, methocarbamol, ondansetron (ZOFRAN) IV, ondansetron, oxyCODONE-acetaminophen  Assessment:  51 yo F on Lovenox / Warfarin for acute PE on day #7 overlap.  Patient still with pink urine as of last PM but recommendations per urology to monitor for now and  follow up as outpatient with them  INR still subtherapeutic now increasing  Doses so far of warfarin given: 7.5, 7.5, 7.5, 10, 10, 15, 15, 20, 20mg   Goal of Therapy:  INR 2-3   Plan:  1) Continue Lovenox 160mg  SQ q12h.   2) Repeat 20mg  again today  Hessie Knows, PharmD, BCPS pager [phone] 02/17/2012 1:28 PM

## 2012-02-17 NOTE — Progress Notes (Signed)
Patient ID: [name], female   DOB: 07/28/1961, 51 y.o.   MRN: [id number] Two week post-op today.  Her right knee incision looks good. I removed the staples and placed steristrips.  She has good mobility and her calf is soft.  She will need to follow-up in my office in 2 weeks.

## 2012-02-17 NOTE — Progress Notes (Signed)
Physical Therapy Treatment Patient Details Name: [name] MRN: [id number] DOB: [birth year] Today's Date: 02/17/2012  PT Assessment/Plan  PT - Assessment/Plan Comments on Treatment Session: Pt performed gait training with straight cane however due to energy conservation and R knee pain switched back to RW.  Pt also performed exercises seated and standing. PT Plan: Discharge plan remains appropriate Follow Up Recommendations: Home health PT Equipment Recommended: None recommended by PT PT Goals  Acute Rehab PT Goals PT Goal: Ambulate - Progress: Progressing toward goal Pt will Perform Home Exercise Program: Independently PT Goal: Perform Home Exercise Program - Progress: Updated due to goal met  PT Treatment Precautions/Restrictions  Precautions Precautions: Knee Required Braces or Orthoses: No Restrictions Weight Bearing Restrictions: No RLE Weight Bearing: Weight bearing as tolerated Mobility (including Balance) Bed Mobility Bed Mobility: No Transfers Transfers: Yes Sit to Stand: 6: Modified independent (Device/Increase time);With upper extremity assist;From bed Stand to Sit: 6: Modified independent (Device/Increase time);With upper extremity assist;To chair/3-in-1 Ambulation/Gait Ambulation/Gait: Yes Ambulation/Gait Assistance: 4: Min assist;5: Supervision Ambulation/Gait Assistance Details (indicate cue type and reason): pt ambulated 20 feet after PT demonstrated proper gait with straight cane min/guard but switched back to using RW for energy conservation and decreasing R knee pain for another 200 feet with 3 standing rest breaks at supervision level Ambulation Distance (Feet): 220 Feet (total) Assistive device: Rolling walker;Straight cane Gait Pattern: Step-through pattern    Exercise  Total Joint Exercises Ankle Circles/Pumps: AROM;Both;10 reps;Seated Heel Slides: AROM;Right;20 reps;Seated Straight Leg Raises: AROM;Strengthening;Right;10 reps;Seated Long  Arc Quad: AROM;Strengthening;Right;15 reps;Seated Marching in Standing: AROM;Strengthening;Right;10 reps;Standing;Other (comment) (with RW) End of Session PT - End of Session Equipment Utilized During Treatment: Gait belt Activity Tolerance: Patient tolerated treatment well Patient left: in chair;with call bell in reach General Behavior During Session: [facility] for tasks performed Cognition: [facility] for tasks performed  Afreen Siebels,KATHrine E 02/17/2012, 1:50 PM Pager: [phone]

## 2012-02-17 NOTE — Progress Notes (Signed)
Subjective: Patient states SOB improving. Patient states still with some clots in urine, however improving. Objective: Vital signs in last 24 hours: Temp:  [97.3 F (36.3 C)-98.4 F (36.9 C)] 97.9 F (36.6 C) (02/19 1523) Pulse Rate:  [84-89] 84  (02/19 1523) Resp:  [18] 18  (02/19 1523) BP: (127-144)/(70-79) 144/74 mmHg (02/19 1523) SpO2:  [92 %-94 %] 93 % (02/19 1523) Weight:  [153.2 kg (337 lb 11.9 oz)] 153.2 kg (337 lb 11.9 oz) (02/19 0500) Last BM Date: 02/15/12 Intake/Output from previous day: 02/18 0701 - 02/19 0700 In: 720 [P.O.:720] Out: 1402 [Urine:1400; Stool:2] Intake/Output this shift:      General Appearance:    Alert, cooperative, obese, in no distress  Lungs:     Decreased BS at bases, respirations unlabored   Heart:    Regular rate and rhythm, S1 and S2 normal, no murmur, rub   or gallop  Abdomen:     Soft, non-tender, bowel sounds active all four quadrants,    no masses, no organomegaly. No CVA TTP.  Extremities:   RLE trace to 1 + edema, LLE. No edema and no cyanosis. Wound site c/d/i with steri strips.  Neurologic:   CNII-XII intact, nonfocal       Weight change: 1.109 kg (2 lb 7.1 oz)  Intake/Output Summary (Last 24 hours) at 02/17/12 1711 Last data filed at 02/17/12 4098  Gross per 24 hour  Intake      0 ml  Output      2 ml  Net     -2 ml    Lab Results:   Basename 02/17/12 0400 02/16/12 0405  NA 138 136  K 4.4 4.3  CL 100 97  CO2 30 31  GLUCOSE 92 110*  BUN 16 18  CREATININE 0.96 0.96  CALCIUM 9.0 9.3    Basename 02/17/12 0400 02/16/12 0405  WBC 8.3 8.9  HGB 10.3* 10.5*  HCT 32.3* 33.2*  PLT 223 242  MCV 86.1 86.0   PT/INR  Basename 02/17/12 0400 02/16/12 0405  LABPROT 19.8* 17.8*  INR 1.65* 1.44   ABG No results found for this basename: PHART:2,PCO2:2,PO2:2,HCO3:2 in the last 72 hours  Micro Results: Recent Results (from the past 240 hour(s))  URINE CULTURE     Status: Normal   Collection Time   02/15/12  9:10 AM      Component Value Range Status Comment   Specimen Description URINE, CATHETERIZED   Final    Special Requests NONE   Final    Culture  Setup Time [id number]   Final    Colony Count NO GROWTH   Final    Culture NO GROWTH   Final    Report Status 02/16/2012 FINAL   Final    Studies/Results:  ECHO Study Conclusions  - Left ventricle: The cavity size was normal. Wall thickness was normal. Systolic function was normal. The estimated ejection fraction was in the range of 60% to 65%. Although no diagnostic regional wall motion abnormality was identified, this possibility cannot be completely excluded on the basis of this study. Features are consistent with a pseudonormal left ventricular filling pattern, with concomitant abnormal relaxation and increased filling pressure (grade 2 diastolic dysfunction).  LE DOPPLERS Summary:  - No obvious evidence of deep vein or superficial thrombosis involving the right lower extremity and left lower extremity. - No evidence of Baker's cyst on the right or left. Other specific details can be found in the table(s) above  No results found. Medications Scheduled  Meds:    . acetaminophen  650 mg Oral Once  . ceFEPime (MAXIPIME) IV  1 g Intravenous Q8H  . clopidogrel  75 mg Oral Daily  . dextromethorphan-guaiFENesin  1 tablet Oral BID  . enoxaparin (LOVENOX) injection  1 mg/kg Subcutaneous Q12H  . escitalopram  20 mg Oral Daily  . Fluticasone-Salmeterol  1 puff Inhalation BID  . furosemide  40 mg Oral BID  . gabapentin  300 mg Oral Daily  . glipiZIDE  5 mg Oral BID AC  . insulin aspart  0-5 Units Subcutaneous QHS  . insulin aspart  0-9 Units Subcutaneous TID WC  . losartan  100 mg Oral Daily  . pantoprazole  40 mg Oral q morning - 10a  . potassium chloride  40 mEq Oral QPC breakfast  . rosuvastatin  20 mg Oral Daily  . senna  1 tablet Oral BID  . warfarin  20 mg Oral ONCE-1800  . warfarin  20 mg Oral ONCE-1800   Continuous Infusions:   PRN Meds:.acetaminophen, acetaminophen, albuterol, diphenhydrAMINE, levalbuterol, methocarbamol, ondansetron (ZOFRAN) IV, ondansetron, oxyCODONE-acetaminophen Assessment/Plan:   LOS: 9 days  PE (pulmonary embolism)  As discussed above, morbidly obese patient status post recent R. total knee arthroplasty presenting with the worsening shortness of breath and CT angiogram with a small PE.  -continue Lovenox and Coumadin till INR therapeutic with close monitoring for bleeding inhouse given her anemia and hematuria. INR now slowly  rising. Coumadin dose increased. Follow. Monitor closely with new onset hematuria. Follow  .HAP (hospital-acquired pneumonia)  - Clinically improving. Pt remaining afebrile, no leukocytosis and hemodynamically stable - S/P 8 days of cefepime.  Hematuria UA with RBC tntc. Urine cultures pending. CT abd and pelvis with renal calculi but no obstructing stones. Slowly improving. Patient seen by urology recommendation is to monitor for now with outpatient follow up.  .Diastolic CHF Pt with improved SOB.  Continue lasix at current dose.  .Anemia  guaiac neg x1, possibly post op anemia -Hgb stable. S/p  2 units of PRBC. Follow h/h.  Marland KitchenHypertension  -Continue outpatient medications   .GERD (gastroesophageal reflux disease)  - PPI  .DM (diabetes mellitus)  -Continue oral hypoglycemics, monitor Accu-Cheks and cover with sliding scale insulin.  .Asthma  -Stable, continue bronchodilators  .Hypokalemia  . resolved .History of coronary artery disease  -She was chest pain-free, continue outpatient medications.  . Status post recent right total knee arthroplasty  .S/P TKA - per ortho, appreciate f/u. S/p removal of staples.  Belkis Norbeck 02/17/2012, 5:11 PM

## 2012-02-18 LAB — CBC
Hemoglobin: 10.2 g/dL — ABNORMAL LOW (ref 12.0–15.0)
MCHC: 32.5 g/dL (ref 30.0–36.0)
Platelets: 266 10*3/uL (ref 150–400)
RBC: 3.71 MIL/uL — ABNORMAL LOW (ref 3.87–5.11)

## 2012-02-18 LAB — PROTIME-INR
INR: 1.85 — ABNORMAL HIGH (ref 0.00–1.49)
Prothrombin Time: 21.7 seconds — ABNORMAL HIGH (ref 11.6–15.2)

## 2012-02-18 LAB — GLUCOSE, CAPILLARY
Glucose-Capillary: 121 mg/dL — ABNORMAL HIGH (ref 70–99)
Glucose-Capillary: 116 mg/dL — ABNORMAL HIGH (ref 70–99)

## 2012-02-18 MED ORDER — WARFARIN SODIUM 10 MG PO TABS
20.0000 mg | ORAL_TABLET | Freq: Once | ORAL | Status: AC
  Administered 2012-02-18: 20 mg via ORAL
  Filled 2012-02-18: qty 2

## 2012-02-18 NOTE — Progress Notes (Signed)
Physical Therapy Discharge Summary  Patient Name: [name] MVHQI'O Date: 02/18/2012 Pt agreeable to D/C from acute PT.  Pt reports she is ambulating in hallway with rest breaks as needed with spouse.  Discussed continuing use of RW upon return home for energy conservation.  Pt reports she has been performing her exercises and has handout if needed.  Pt reports she feels she is able to manage her exercises and mobility on her own for the rest of this hospitalization and will f/u with HHPT.  Please re-order if new needs arise.  Thanks!  Zenovia Jarred, PT, DPT [phone]

## 2012-02-18 NOTE — Progress Notes (Signed)
Subjective:    Denies any new complaints.  Objective: Weight change: 0.4 kg (14.1 oz)  Intake/Output Summary (Last 24 hours) at 02/18/12 1622 Last data filed at 02/18/12 1444  Gross per 24 hour  Intake   1200 ml  Output    650 ml  Net    550 ml    Filed Vitals:   02/18/12 1354  BP: 115/71  Pulse: 87  Temp: 98.4 F (36.9 C)  Resp: 18   On exam She is alert afebrile and comfortable. Lungs clear Heart regular  Rate and rhythm.  Abdomen: soft non tender nondistended bowel sounds heard.  Extremities: trace edema.   Lab Results: Results for orders placed during the hospital encounter of 02/08/12 (from the past 24 hour(s))  GLUCOSE, CAPILLARY     Status: Abnormal   Collection Time   02/17/12  5:35 PM      Component Value Range   Glucose-Capillary 202 (*) 70 - 99 (mg/dL)  GLUCOSE, CAPILLARY     Status: Normal   Collection Time   02/17/12  8:37 PM      Component Value Range   Glucose-Capillary 90  70 - 99 (mg/dL)  PROTIME-INR     Status: Abnormal   Collection Time   02/18/12  4:30 AM      Component Value Range   Prothrombin Time 21.7 (*) 11.6 - 15.2 (seconds)   INR 1.85 (*) 0.00 - 1.49   CBC     Status: Abnormal   Collection Time   02/18/12  4:30 AM      Component Value Range   WBC 7.4  4.0 - 10.5 (K/uL)   RBC 3.71 (*) 3.87 - 5.11 (MIL/uL)   Hemoglobin 10.2 (*) 12.0 - 15.0 (g/dL)   HCT 16.1 (*) 09.6 - 46.0 (%)   MCV 84.6  78.0 - 100.0 (fL)   MCH 27.5  26.0 - 34.0 (pg)   MCHC 32.5  30.0 - 36.0 (g/dL)   RDW 04.5  40.9 - 81.1 (%)   Platelets 266  150 - 400 (K/uL)  GLUCOSE, CAPILLARY     Status: Abnormal   Collection Time   02/18/12  7:58 AM      Component Value Range   Glucose-Capillary 107 (*) 70 - 99 (mg/dL)  GLUCOSE, CAPILLARY     Status: Abnormal   Collection Time   02/18/12 12:01 PM      Component Value Range   Glucose-Capillary 121 (*) 70 - 99 (mg/dL)     Micro Results: Recent Results (from the past 240 hour(s))  URINE CULTURE     Status: Normal   Collection Time   02/15/12  9:10 AM      Component Value Range Status Comment   Specimen Description URINE, CATHETERIZED   Final    Special Requests NONE   Final    Culture  Setup Time [id number]   Final    Colony Count NO GROWTH   Final    Culture NO GROWTH   Final    Report Status 02/16/2012 FINAL   Final     Studies/Results: Ct Abdomen Pelvis Wo Contrast  02/15/2012  *RADIOLOGY REPORT*  Clinical Data:  History of urinary tract stones and painless hematuria.  On blood thinners.  History of abnormal uterine bleeding.  Pulmonary embolism.  Hypertension.  Diabetes.  CT ABDOMEN AND PELVIS WITHOUT CONTRAST (CT UROGRAM)  Technique: Contiguous axial images of the abdomen and pelvis without oral or intravenous contrast were obtained.  Comparison: Plain films of  10/03/2011.  Most recent CT of 12/22/2006.  Findings:  Exam is limited for evaluation of entities other than urinary tract calculi due to lack of oral or intravenous contrast.   Minimal airspace disease at the anterior left lung base and right lower lobe.  Improved since 02/08/2012.  Normal heart size without pericardial or pleural effusion.  Distal right coronary artery atherosclerosis which is age advanced.  Mild hepatomegaly, greater than 18 cm cranial caudal.  Normal spleen, stomach, pancreas. Cholecystectomy without biliary ductal dilatation.  Normal adrenal glands.  Lower pole right renal calculus.  Interpolar left renal lesion measures fluid density and 1.2 cm on image 28, likely a cyst.  Phleboliths in the pelvis. No hydroureter or ureteric calculi.  Age advanced aortic atherosclerosis.  Node adjacent the left adrenal gland is prominent but similar back to 2007.  Colonic stool burden suggests constipation.  Normal terminal ileum.  Appendix not visualized. No pelvic adenopathy.  Normal urinary bladder and uterus, without adnexal mass or significant free pelvic fluid.  Fat containing pelvic wall hernia anteriorly.  There has been prior anterior  abdominal wall hernia repair.  There is a fat-containing hernia just cephalad to this.  Injection granulomas within the anterior abdominal wall. No acute osseous abnormality.  A punctate left renal collecting systems stone is only apparent on coronal reformatted images.  IMPRESSION:  1.  Bilateral renal collecting system calculi, without ureteric stone or hydroureter. 2.  No other explanation for hematuria. 3.  Improved bibasilar airspace disease since 02/08/2012. 4.  Ventral abdominal wall hernia repair with both superior and inferior fat containing hernias. 5. Possible constipation.  6. Age advanced coronary artery atherosclerosis.  Recommend assessment of coronary risk factors and consideration of medical therapy.   .  Original Report Authenticated By: Consuello Bossier, M.D.   Dg Chest 2 View  01/29/2012  *RADIOLOGY REPORT*  Clinical Data: Preop for right total knee replacement.  History of hypertension and coronary artery disease.  CHEST - 2 VIEW  Comparison: Chest x-ray 11/19/2011.  Findings: Lung volumes are normal.  No consolidative airspace disease.  No pleural effusions.  No pneumothorax.  No pulmonary nodule or mass noted.  Pulmonary vasculature and the cardiomediastinal silhouette are within normal limits. Atherosclerotic calcifications in the arch of the aorta.  Prominent pericardial fat pad again noted.  IMPRESSION: 1. No radiographic evidence of acute cardiopulmonary disease. 2.  Atherosclerosis.  Original Report Authenticated By: Florencia Reasons, M.D.   Ct Angio Chest W/cm &/or Wo Cm  02/08/2012  **ADDENDUM** CREATED: 02/08/2012 12:52:14  As noted in the body of the report, the primary differential consideration for the bilateral pulmonary opacities is interstitial edema.  Infection remains favored due to lack of pleural effusions or cardiomegaly.  **END ADDENDUM** SIGNED BY: Charline Bills, M.D.    02/08/2012  *RADIOLOGY REPORT*  Clinical Data: Shortness of breath, status post total knee  replacement, evaluate for PE  CT ANGIOGRAPHY CHEST  Technique:  Multidetector CT imaging of the chest using the standard protocol during bolus administration of intravenous contrast. Multiplanar reconstructed images including MIPs were obtained and reviewed to evaluate the vascular anatomy.  Contrast: OMNIPAQUE IOHEXOL 300 MG/ML IV SOLN  Comparison: Chest radiograph dated 02/08/2012, CT chest dated 02/09/2009  Findings: Suspected small subsegmental filling defects within branches of the right upper lobe pulmonary artery (series 5/images 97 and 105).  If this is a true finding, the overall clot burden is very small.  Patchy/nodular multifocal opacities throughout all lobes, suspicious for multifocal  pneumonia.  Interstitial edema is considered unlikely given the nodular appearance.  No pleural effusion or pneumothorax.  Visualized thyroid is unremarkable.  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  Small mediastinal lymph nodes measuring up to 11 mm short axis (series 4/image 33), likely reactive.  No suspicious hilar or axillary lymphadenopathy.  Visualized upper abdomen is unremarkable.  Nonobstructive bowel gas pattern.  IMPRESSION: Suspected small subsegmental right upper lobe pulmonary emboli. Overall clot burden is very small.  Patchy multifocal opacities throughout all lobes, suspicious for multifocal pneumonia.  Critical Value/emergent results were called by telephone at the time of interpretation on 02/08/2012  at 1100 hours  to  Dr. Doug Sou, who verbally acknowledged these results. Original Report Authenticated By: Charline Bills, M.D.   Dg Chest Port 1 View  02/08/2012  *RADIOLOGY REPORT*  Clinical Data: Chest pain, shortness of breath  PORTABLE CHEST - 1 VIEW  Comparison: 01/28/1929  Findings: Study is limited by patient's large body habitus. Cardiomegaly is noted.  There is interstitial prominence and patchy  airspace disease bilateral upper lobe medially.  There is airspace disease in lower lobe right greater than left.  The findings may be due to asymmetric pneumonia or asymmetric pulmonary edema. Clinical correlation is necessary.  IMPRESSION:  There is interstitial prominence and patchy airspace disease bilateral upper lobe medially.  There is airspace disease in lower lobe right greater than left.  The findings may be due to asymmetric pneumonia or asymmetric pulmonary edema. Clinical correlation is necessary.  Original Report Authenticated By: Natasha Mead, M.D.   X-ray Knee Right Port  02/03/2012  *RADIOLOGY REPORT*  Clinical Data: Post right knee arthroplasty  PORTABLE RIGHT KNEE - 1-2 VIEW  Comparison: Portable exam 1647 hours compared to 01/08/2008  Findings: Components of right knee prosthesis in expected positions. Scattered soft tissue swelling. Surgical drain and skin clips. No dislocation seen. Tiny bone fragment identified at the medial margin of the proximal tibia compatible with tiny fracture fragment. No definite fracture plane is seen traversing the tibial metaphysis. Uncertain whether this traverses to the medial margin of the tibia, medial to the tibial plateau component.  IMPRESSION: Components of right knee prosthesis in expected position. Small bone fragment identified at medial margin of proximal medial metaphysis question tiny avulsion fracture though cannot exclude extension to the medial margin of the proximal tibia.  Original Report Authenticated By: Lollie Marrow, M.D.   Medications: Scheduled Meds:   . acetaminophen  650 mg Oral Once  . clopidogrel  75 mg Oral Daily  . dextromethorphan-guaiFENesin  1 tablet Oral BID  . enoxaparin (LOVENOX) injection  1 mg/kg Subcutaneous Q12H  . escitalopram  20 mg Oral Daily  . Fluticasone-Salmeterol  1 puff Inhalation BID  . furosemide  40 mg Oral BID  . gabapentin  300 mg Oral Daily  . glipiZIDE  5 mg Oral BID AC  . insulin aspart  0-5  Units Subcutaneous QHS  . insulin aspart  0-9 Units Subcutaneous TID WC  . losartan  100 mg Oral Daily  . pantoprazole  40 mg Oral q morning - 10a  . potassium chloride  40 mEq Oral QPC breakfast  . rosuvastatin  20 mg Oral Daily  . senna  1 tablet Oral BID  . warfarin  20 mg Oral ONCE-1800  . warfarin  20 mg Oral ONCE-1800   Continuous Infusions:  PRN Meds:.acetaminophen, acetaminophen, albuterol, diphenhydrAMINE, levalbuterol, methocarbamol, ondansetron (ZOFRAN)  IV, ondansetron, oxyCODONE-acetaminophen  Assessment/Plan: Patient Active Hospital Problem List:  PE (pulmonary embolism)  As discussed above, morbidly obese patient status post recent R. total knee arthroplasty presenting with the worsening shortness of breath and CT angiogram with a small PE. -continue Lovenox and Coumadin till INR therapeutic with close monitoring for bleeding inhouse given her anemia and hematuria. INR now slowly rising. Coumadin dose increased. Follow.  Monitor closely with new onset hematuria. Follow  .HAP (hospital-acquired pneumonia)  - Clinically improving. Pt remaining afebrile, no leukocytosis and hemodynamically stable  - S/P 8 days of cefepime.  Hematuria  UA with RBC tntc. Urine cultures . CT abd and pelvis with renal calculi but no obstructing stones. Slowly improving. Patient seen by urology recommendation is to monitor for now with outpatient follow up.  .Diastolic CHF  Pt with improved SOB. Continue lasix at current dose.  .Anemia  guaiac neg x1, possibly post op anemia  -Hgb stable. S/p 2 units of PRBC. Follow h/h.  Marland KitchenHypertension  -Continue outpatient medications   .GERD (gastroesophageal reflux disease)  - PPI  .DM (diabetes mellitus)  -Continue oral hypoglycemics, monitor Accu-Cheks and cover with sliding scale insulin.  .Asthma  -Stable, continue bronchodilators  .Hypokalemia  . resolved  .History of coronary artery disease  -She was chest pain-free, continue outpatient  medications.  . Status post recent right total knee arthroplasty  .S/P TKA - per ortho, appreciate f/u. S/p removal of staples.   LOS: 10 days   [name] 02/18/2012, 4:22 PM

## 2012-02-18 NOTE — Progress Notes (Signed)
ANTICOAGULATION CONSULT NOTE - Follow Up Consult  Pharmacy Consult for Lovenox/Warfarin  Indication: New PE  Allergies  Allergen Reactions  . Hydrocodone     itching    Patient Measurements: Height: 5\' 3"  (160 cm) Weight: 338 lb 10 oz (153.6 kg) IBW/kg (Calculated) : 52.4   Vital Signs: Temp: 98.1 F (36.7 C) (02/20 0535) Temp src: Oral (02/20 0535) BP: 121/78 mmHg (02/20 0535) Pulse Rate: 89  (02/20 0535)  Labs:  Basename 02/18/12 0430 02/17/12 0400 02/16/12 0405  HGB 10.2* 10.3* --  HCT 31.4* 32.3* 33.2*  PLT 266 223 242  APTT -- -- --  LABPROT 21.7* 19.8* 17.8*  INR 1.85* 1.65* 1.44  HEPARINUNFRC -- -- --  CREATININE -- 0.96 0.96  CKTOTAL -- -- --  CKMB -- -- --  TROPONINI -- -- --   Estimated Creatinine Clearance: 100.5 ml/min (by C-G formula based on Cr of 0.96).   Medications:  Scheduled:     . acetaminophen  650 mg Oral Once  . clopidogrel  75 mg Oral Daily  . dextromethorphan-guaiFENesin  1 tablet Oral BID  . enoxaparin (LOVENOX) injection  1 mg/kg Subcutaneous Q12H  . escitalopram  20 mg Oral Daily  . Fluticasone-Salmeterol  1 puff Inhalation BID  . furosemide  40 mg Oral BID  . gabapentin  300 mg Oral Daily  . glipiZIDE  5 mg Oral BID AC  . insulin aspart  0-5 Units Subcutaneous QHS  . insulin aspart  0-9 Units Subcutaneous TID WC  . losartan  100 mg Oral Daily  . pantoprazole  40 mg Oral q morning - 10a  . potassium chloride  40 mEq Oral QPC breakfast  . rosuvastatin  20 mg Oral Daily  . senna  1 tablet Oral BID  . warfarin  20 mg Oral ONCE-1800   Infusions:   PRN: acetaminophen, acetaminophen, albuterol, diphenhydrAMINE, levalbuterol, methocarbamol, ondansetron (ZOFRAN) IV, ondansetron, oxyCODONE-acetaminophen  Assessment:  51 yo F on Lovenox / Warfarin for acute PE on day #7 overlap.  Clots in urine improving per Md note  INR still subtherapeutic but continues to rise steadily  Doses so far of warfarin given: 7.5, 7.5, 7.5, 10,  10, 15, 15, 20, 20, 20mg   Goal of Therapy:  INR 2-3   Plan:  1) Continue Lovenox 160mg  SQ q12h until INR > 2 x 24hr 2) Repeat 20mg  again today  Hessie Knows, PharmD, BCPS pager [phone] 02/18/2012 12:05 PM

## 2012-02-19 LAB — CBC
HCT: 32.7 % — ABNORMAL LOW (ref 36.0–46.0)
Hemoglobin: 10.5 g/dL — ABNORMAL LOW (ref 12.0–15.0)
MCV: 84.9 fL (ref 78.0–100.0)
RDW: 14.4 % (ref 11.5–15.5)
WBC: 5.9 10*3/uL (ref 4.0–10.5)

## 2012-02-19 LAB — GLUCOSE, CAPILLARY: Glucose-Capillary: 94 mg/dL (ref 70–99)

## 2012-02-19 LAB — BASIC METABOLIC PANEL
BUN: 16 mg/dL (ref 6–23)
Chloride: 99 mEq/L (ref 96–112)
Creatinine, Ser: 0.89 mg/dL (ref 0.50–1.10)
GFR calc Af Amer: 85 mL/min — ABNORMAL LOW (ref >90)
Glucose, Bld: 107 mg/dL — ABNORMAL HIGH (ref 70–99)
Potassium: 3.7 mEq/L (ref 3.5–5.1)

## 2012-02-19 LAB — PROTIME-INR: INR: 2.12 — ABNORMAL HIGH (ref 0.00–1.49)

## 2012-02-19 MED ORDER — WARFARIN SODIUM 10 MG PO TABS: 20.0000 mg | ORAL_TABLET | Freq: Once | ORAL | Status: DC

## 2012-02-19 MED ORDER — WARFARIN SODIUM 10 MG PO TABS
20.0000 mg | ORAL_TABLET | Freq: Once | ORAL | Status: DC
  Filled 2012-02-19: qty 2

## 2012-02-19 MED ORDER — FUROSEMIDE 40 MG PO TABS: 40.0000 mg | ORAL_TABLET | Freq: Two times a day (BID) | ORAL | Status: DC

## 2012-02-19 MED ORDER — POTASSIUM CHLORIDE CRYS ER 20 MEQ PO TBCR: 40.0000 meq | EXTENDED_RELEASE_TABLET | Freq: Every day | ORAL | Status: DC

## 2012-02-19 NOTE — Progress Notes (Signed)
ANTICOAGULATION CONSULT NOTE - Follow Up Consult  Pharmacy Consult for Lovenox/Warfarin  Indication: New PE  Allergies  Allergen Reactions  . Hydrocodone     itching    Patient Measurements: Height: 5\' 3"  (160 cm) Weight: 339 lb 8.1 oz (154 kg) IBW/kg (Calculated) : 52.4   Vital Signs: Temp: 98.3 F (36.8 C) (02/21 0520) Temp src: Oral (02/21 0520) BP: 136/67 mmHg (02/21 0520) Pulse Rate: 82  (02/21 0520)  Labs:  Basename 02/19/12 0405 02/18/12 0430 02/17/12 0400  HGB 10.5* 10.2* --  HCT 32.7* 31.4* 32.3*  PLT 242 266 223  APTT -- -- --  LABPROT 24.1* 21.7* 19.8*  INR 2.12* 1.85* 1.65*  HEPARINUNFRC -- -- --  CREATININE 0.89 -- 0.96  CKTOTAL -- -- --  CKMB -- -- --  TROPONINI -- -- --   Estimated Creatinine Clearance: 108.6 ml/min (by C-G formula based on Cr of 0.89).   Medications:  Scheduled:     . acetaminophen  650 mg Oral Once  . clopidogrel  75 mg Oral Daily  . dextromethorphan-guaiFENesin  1 tablet Oral BID  . enoxaparin (LOVENOX) injection  1 mg/kg Subcutaneous Q12H  . escitalopram  20 mg Oral Daily  . Fluticasone-Salmeterol  1 puff Inhalation BID  . furosemide  40 mg Oral BID  . gabapentin  300 mg Oral Daily  . glipiZIDE  5 mg Oral BID AC  . insulin aspart  0-5 Units Subcutaneous QHS  . insulin aspart  0-9 Units Subcutaneous TID WC  . losartan  100 mg Oral Daily  . pantoprazole  40 mg Oral q morning - 10a  . potassium chloride  40 mEq Oral QPC breakfast  . rosuvastatin  20 mg Oral Daily  . senna  1 tablet Oral BID  . warfarin  20 mg Oral ONCE-1800   Infusions:   PRN: acetaminophen, acetaminophen, albuterol, diphenhydrAMINE, levalbuterol, methocarbamol, ondansetron (ZOFRAN) IV, ondansetron, oxyCODONE-acetaminophen  Assessment:  51 yo F on Lovenox / Warfarin for acute PE on day #7 overlap.  Clots in urine improving per Md note yesterday  INR finally therapeutic  Doses so far of warfarin given: 7.5, 7.5, 7.5, 10, 10, 15, 15, 20, 20, 20,  20mg   Goal of Therapy:  INR 2-3   Plan:  1) Continue Lovenox 160mg  SQ q12h until INR > 2 x 24hr 2) Repeat 20mg  again today 3) Recommend 20mg  warfarin daily at this time based on INR results with close INR followup  Hessie Knows, PharmD, BCPS pager [phone] 02/19/2012 1:16 PM

## 2012-02-19 NOTE — Progress Notes (Signed)
Discharge to home, alert and oriented , husband at bedside, no distress. R knee surgical site pink intact with steri strips no s/s of infection, PIV removed no redness no swelling noted. D/c home instructions follow up  Appointments and follow up  labs to be taken given to patient.

## 2012-02-19 NOTE — Discharge Summary (Signed)
DISCHARGE SUMMARY  [name]  MR#: [id number]  DOB:August 06, 1961  Date of Admission: 02/08/2012 Date of Discharge: 02/19/2012  Attending Physician:Dan Scearce  Patient's WUJ:WJXBJYN,WGNFAOZH C, MD, MD  Consults:Treatment Team:  Antony Haste, MD  Discharge Diagnoses: Present on Admission:  .PE (pulmonary embolism) .HAP (hospital-acquired pneumonia) .Anemia .Hypertension .GERD (gastroesophageal reflux disease) .DM (diabetes mellitus) .Asthma .Hypokalemia    Medication List  As of 02/19/2012  1:23 PM   STOP taking these medications         methocarbamol 500 MG tablet      oxyCODONE-acetaminophen 5-325 MG per tablet         TAKE these medications         albuterol 108 (90 BASE) MCG/ACT inhaler   Commonly known as: PROVENTIL HFA;VENTOLIN HFA   Inhale 1 puff into the lungs every 6 (six) hours as needed. Shortness of breath.      atorvastatin 40 MG tablet   Commonly known as: LIPITOR   Take 40 mg by mouth at bedtime.      clopidogrel 75 MG tablet   Commonly known as: PLAVIX   Take 75 mg by mouth daily.      diclofenac 25 MG EC tablet   Commonly known as: VOLTAREN   Take 25 mg by mouth 2 (two) times daily.      escitalopram 20 MG tablet   Commonly known as: LEXAPRO   Take 20 mg by mouth daily.      Fluticasone-Salmeterol 250-50 MCG/DOSE Aepb   Commonly known as: ADVAIR   Inhale 1 puff into the lungs 2 (two) times daily.      furosemide 40 MG tablet   Commonly known as: LASIX   Take 1 tablet (40 mg total) by mouth 2 (two) times daily at 10 am and 4 pm.      gabapentin 300 MG capsule   Commonly known as: NEURONTIN   Take 300 mg by mouth daily. Patient states she takes it once daily. Per patient.      glipiZIDE 5 MG tablet   Commonly known as: GLUCOTROL   Take 5 mg by mouth 2 (two) times daily before a meal.      hydrochlorothiazide 25 MG tablet   Commonly known as: HYDRODIURIL   Take 25 mg by mouth daily.      ibuprofen 200 MG  tablet   Commonly known as: ADVIL,MOTRIN   Take 200 mg by mouth every 6 (six) hours as needed. For pain      losartan 100 MG tablet   Commonly known as: COZAAR   Take 100 mg by mouth daily.      potassium chloride SA 20 MEQ tablet   Commonly known as: K-DUR,KLOR-CON   Take 2 tablets (40 mEq total) by mouth daily after breakfast.      warfarin 10 MG tablet   Commonly known as: COUMADIN   Take 2 tablets (20 mg total) by mouth one time only at 6 PM.           Brief Admit Note: The patient is a 51 year old morbidly obese white female recently discharged from the hospital on 02/05/2011 status post of right total knee arthroplasty per Dr. Magnus Ivan for degenerative arthritis of the knee, a local presents with  Worsening shortness of breath and cough/hemoptysis, she was found to have pneumonia ad small PE.    Hospital Course: PE (pulmonary embolism)  Morbidly obese patient status post recent R. total knee arthroplasty presenting with the worsening shortness  of breath and CT angiogram with a small PE. -continue Lovenox  Till the INR was therapeutic  And she being discharged on Coumadin with close monitoring for bleeding in house given her anemia and hematuria.    Marland KitchenHAP (hospital-acquired pneumonia)  Improved. Pt remaining afebrile, no leukocytosis and hemodynamically stable  - S/P 8 days of cefepime.  Hematuria  UA with RBC tntc. Urine cultures . CT abd and pelvis with renal calculi but no obstructing stones. Resolved. Patient seen by urology recommendation is to monitor for now with outpatient follow up.  .Diastolic CHF  Pt with improved SOB. Continue lasix at current dose.  .Anemia  guaiac neg x1, possibly post op anemia  -Hgb stable. S/p 2 units of PRBC. Follow h/h as outpatient.  .Hypertension  -Continue home medications   .GERD (gastroesophageal reflux disease)  - PPI  .DM (diabetes mellitus)  -Continue oral hypoglycemics, monitor Accu-Cheks and cover with sliding scale  insulin.  .Asthma  -Stable, continue bronchodilators  .Hypokalemia  . resolved  .History of coronary artery disease  -She was chest pain-free, continue outpatient medications.      Day of Discharge BP 136/67  Pulse 82  Temp(Src) 98.3 F (36.8 C) (Oral)  Resp 18  Ht 5\' 3"  (1.6 m)  Wt 154 kg (339 lb 8.1 oz)  BMI 60.14 kg/m2  SpO2 92%  LMP 07/26/2009  Physical Exam: On exam  She is alert afebrile and comfortable.  Lungs clear  Heart regular Rate and rhythm.  Abdomen: soft non tender nondistended bowel sounds heard.  Extremities: trace edema.    Results for orders placed during the hospital encounter of 02/08/12 (from the past 24 hour(s))  GLUCOSE, CAPILLARY     Status: Abnormal   Collection Time   02/18/12  4:39 PM      Component Value Range   Glucose-Capillary 116 (*) 70 - 99 (mg/dL)   Comment 1 Notify RN    GLUCOSE, CAPILLARY     Status: Normal   Collection Time   02/18/12  9:26 PM      Component Value Range   Glucose-Capillary 87  70 - 99 (mg/dL)  PROTIME-INR     Status: Abnormal   Collection Time   02/19/12  4:05 AM      Component Value Range   Prothrombin Time 24.1 (*) 11.6 - 15.2 (seconds)   INR 2.12 (*) 0.00 - 1.49   CBC     Status: Abnormal   Collection Time   02/19/12  4:05 AM      Component Value Range   WBC 5.9  4.0 - 10.5 (K/uL)   RBC 3.85 (*) 3.87 - 5.11 (MIL/uL)   Hemoglobin 10.5 (*) 12.0 - 15.0 (g/dL)   HCT 16.1 (*) 09.6 - 46.0 (%)   MCV 84.9  78.0 - 100.0 (fL)   MCH 27.3  26.0 - 34.0 (pg)   MCHC 32.1  30.0 - 36.0 (g/dL)   RDW 04.5  40.9 - 81.1 (%)   Platelets 242  150 - 400 (K/uL)  BASIC METABOLIC PANEL     Status: Abnormal   Collection Time   02/19/12  4:05 AM      Component Value Range   Sodium 137  135 - 145 (mEq/L)   Potassium 3.7  3.5 - 5.1 (mEq/L)   Chloride 99  96 - 112 (mEq/L)   CO2 29  19 - 32 (mEq/L)   Glucose, Bld 107 (*) 70 - 99 (mg/dL)   BUN 16  6 - 23 (  mg/dL)   Creatinine, Ser 4.09  0.50 - 1.10 (mg/dL)   Calcium 9.1  8.4  - 10.5 (mg/dL)   GFR calc non Af Amer 73 (*) >90 (mL/min)   GFR calc Af Amer 85 (*) >90 (mL/min)  GLUCOSE, CAPILLARY     Status: Abnormal   Collection Time   02/19/12  7:33 AM      Component Value Range   Glucose-Capillary 103 (*) 70 - 99 (mg/dL)  GLUCOSE, CAPILLARY     Status: Normal   Collection Time   02/19/12 12:04 PM      Component Value Range   Glucose-Capillary 94  70 - 99 (mg/dL)    Disposition: Home with Home PT.    Follow-up Appts: Discharge Orders    Future Orders Please Complete By Expires   Diet - low sodium heart healthy      Discharge instructions      Comments:   INR check tomorrow and on Monday as you are on high dose of coumadin. Please go to PCP if you have blood in urine.   Activity as tolerated - No restrictions         Follow-up Information    Follow up with Ron Parker, MD. Schedule an appointment as soon as possible for a visit in 1 week.      Follow up with [name], MD. Schedule an appointment as soon as possible for a visit in 2 weeks.   Contact information:   [facility] [address] [place] [address] [phone]       Follow up with Antony Haste, MD. Schedule an appointment as soon as possible for a visit in 1 week.   Contact information:   [address] [facility] [place] [address] [phone]          Tests Needing Follow-up: INR tomorrow and Monday. H&H in one week.   Time spent in discharge (includes decision making & examination of pt): 45 minutes  Signed: Esra Frankowski 02/19/2012, 1:23 PM

## 2012-02-19 NOTE — Progress Notes (Signed)
Patient to be seen at the [facility] Coumadin clinic [phone] tomorrow and Monday for INR checks; Cardiologist is Dr Clifton James; patient is active with [facility] as prior to admission. Norberta Keens RN with [facility] called for resumption of services. Abelino Derrick RN, BSN, MHA

## 2012-02-20 ENCOUNTER — Ambulatory Visit: Payer: Self-pay | Admitting: Cardiovascular Disease

## 2012-02-20 DIAGNOSIS — I2699 Other pulmonary embolism without acute cor pulmonale: Secondary | ICD-10-CM

## 2012-02-20 DIAGNOSIS — Z7901 Long term (current) use of anticoagulants: Secondary | ICD-10-CM | POA: Insufficient documentation

## 2012-02-20 MED ORDER — WARFARIN SODIUM 10 MG PO TABS: ORAL_TABLET | ORAL | Status: DC

## 2012-02-23 ENCOUNTER — Ambulatory Visit: Payer: Self-pay | Admitting: Cardiology

## 2012-02-23 DIAGNOSIS — Z7901 Long term (current) use of anticoagulants: Secondary | ICD-10-CM

## 2012-02-23 DIAGNOSIS — I2699 Other pulmonary embolism without acute cor pulmonale: Secondary | ICD-10-CM

## 2012-02-27 ENCOUNTER — Ambulatory Visit: Payer: Self-pay | Admitting: Internal Medicine

## 2012-02-27 DIAGNOSIS — I2699 Other pulmonary embolism without acute cor pulmonale: Secondary | ICD-10-CM

## 2012-02-27 DIAGNOSIS — Z7901 Long term (current) use of anticoagulants: Secondary | ICD-10-CM

## 2012-03-01 ENCOUNTER — Other Ambulatory Visit: Payer: Self-pay | Admitting: Obstetrics and Gynecology

## 2012-03-05 ENCOUNTER — Ambulatory Visit: Payer: Self-pay | Admitting: *Deleted

## 2012-03-05 DIAGNOSIS — I2699 Other pulmonary embolism without acute cor pulmonale: Secondary | ICD-10-CM

## 2012-03-05 DIAGNOSIS — Z7901 Long term (current) use of anticoagulants: Secondary | ICD-10-CM

## 2012-03-05 LAB — POCT INR: INR: 2.7

## 2012-03-10 ENCOUNTER — Ambulatory Visit (INDEPENDENT_AMBULATORY_CARE_PROVIDER_SITE_OTHER): Payer: Medicaid Other | Admitting: *Deleted

## 2012-03-10 DIAGNOSIS — I2699 Other pulmonary embolism without acute cor pulmonale: Secondary | ICD-10-CM

## 2012-03-10 DIAGNOSIS — Z7901 Long term (current) use of anticoagulants: Secondary | ICD-10-CM

## 2012-03-24 ENCOUNTER — Ambulatory Visit (INDEPENDENT_AMBULATORY_CARE_PROVIDER_SITE_OTHER): Payer: Medicaid Other | Admitting: *Deleted

## 2012-03-24 DIAGNOSIS — Z7901 Long term (current) use of anticoagulants: Secondary | ICD-10-CM

## 2012-03-24 DIAGNOSIS — I2699 Other pulmonary embolism without acute cor pulmonale: Secondary | ICD-10-CM

## 2012-03-24 LAB — POCT INR: INR: 1.5

## 2012-03-24 MED ORDER — WARFARIN SODIUM 10 MG PO TABS: ORAL_TABLET | ORAL | Status: DC

## 2012-03-31 ENCOUNTER — Ambulatory Visit (INDEPENDENT_AMBULATORY_CARE_PROVIDER_SITE_OTHER): Payer: Medicaid Other | Admitting: *Deleted

## 2012-03-31 DIAGNOSIS — I2699 Other pulmonary embolism without acute cor pulmonale: Secondary | ICD-10-CM

## 2012-03-31 DIAGNOSIS — Z7901 Long term (current) use of anticoagulants: Secondary | ICD-10-CM

## 2012-03-31 LAB — POCT INR: INR: 2.3

## 2012-04-01 ENCOUNTER — Telehealth: Payer: Self-pay | Admitting: Cardiovascular Disease

## 2012-04-01 NOTE — Telephone Encounter (Signed)
(  note continued) medications that need to be held prior to procedure. Pt is on Plavix and Coumadin. I told pt that we have not received any information in our office from Dr. Normand Sloop [facility]) but that I would contact their office to get more information regarding planned procedure.  I called Dr. Redmond Baseman office and left message on Dr. Redmond Baseman assistants voice mail.

## 2012-04-01 NOTE — Telephone Encounter (Signed)
Walk in pt Form " Pt Needs to Know about Meds Before Procedure" Sent to Pat/Mcalhnay  04/01/12/KM

## 2012-04-01 NOTE — Telephone Encounter (Signed)
I called and spoke with pt. She is scheduled for D &C Hysteroscopy on April 15, 2012. She is asking about

## 2012-04-05 ENCOUNTER — Telehealth: Payer: Self-pay | Admitting: Cardiovascular Disease

## 2012-04-05 NOTE — Telephone Encounter (Signed)
Left message

## 2012-04-05 NOTE — Telephone Encounter (Signed)
New problem:  [name], returning call from [place] on Friday. [name] is aware that [name] is off today.

## 2012-04-05 NOTE — Telephone Encounter (Signed)
Spoke with Joni Reining and she will fax over information late today or tomorrow

## 2012-04-06 ENCOUNTER — Encounter (HOSPITAL_COMMUNITY): Payer: Self-pay | Admitting: Pharmacist

## 2012-04-08 ENCOUNTER — Ambulatory Visit (INDEPENDENT_AMBULATORY_CARE_PROVIDER_SITE_OTHER): Payer: Medicaid Other | Admitting: Obstetrics and Gynecology

## 2012-04-08 ENCOUNTER — Ambulatory Visit (INDEPENDENT_AMBULATORY_CARE_PROVIDER_SITE_OTHER): Payer: Medicaid Other

## 2012-04-08 ENCOUNTER — Encounter (HOSPITAL_COMMUNITY): Payer: Self-pay

## 2012-04-08 ENCOUNTER — Encounter (HOSPITAL_COMMUNITY)
Admission: RE | Admit: 2012-04-08 | Discharge: 2012-04-08 | Disposition: A | Discharge status: Home / Self Care | Payer: Medicaid Other | Source: Ambulatory Visit | Attending: Obstetrics and Gynecology | Admitting: Obstetrics and Gynecology

## 2012-04-08 ENCOUNTER — Encounter: Payer: Self-pay | Admitting: Obstetrics and Gynecology

## 2012-04-08 ENCOUNTER — Encounter (HOSPITAL_COMMUNITY): Payer: Self-pay | Admitting: Anesthesiology

## 2012-04-08 ENCOUNTER — Telehealth: Payer: Self-pay | Admitting: Cardiovascular Disease

## 2012-04-08 VITALS — BP 122/80 | HR 70 | Temp 99.0°F | Resp 18 | Ht 64.0 in | Wt 328.0 lb

## 2012-04-08 VITALS — BP 154/80 | HR 81 | Ht 63.0 in | Wt 333.0 lb

## 2012-04-08 DIAGNOSIS — I2699 Other pulmonary embolism without acute cor pulmonale: Secondary | ICD-10-CM

## 2012-04-08 DIAGNOSIS — Z7901 Long term (current) use of anticoagulants: Secondary | ICD-10-CM

## 2012-04-08 DIAGNOSIS — I2510 Atherosclerotic heart disease of native coronary artery without angina pectoris: Secondary | ICD-10-CM

## 2012-04-08 DIAGNOSIS — N840 Polyp of corpus uteri: Secondary | ICD-10-CM

## 2012-04-08 DIAGNOSIS — Z01812 Encounter for preprocedural laboratory examination: Secondary | ICD-10-CM | POA: Insufficient documentation

## 2012-04-08 DIAGNOSIS — Z01818 Encounter for other preprocedural examination: Secondary | ICD-10-CM | POA: Insufficient documentation

## 2012-04-08 HISTORY — DX: Reserved for inherently not codable concepts without codable children: IMO0001

## 2012-04-08 HISTORY — DX: Encounter for other specified aftercare: Z51.89

## 2012-04-08 LAB — BASIC METABOLIC PANEL
CO2: 29 mEq/L (ref 19–32)
Calcium: 9.4 mg/dL (ref 8.4–10.5)
Creatinine, Ser: 0.86 mg/dL (ref 0.50–1.10)
GFR calc Af Amer: 88 mL/min — ABNORMAL LOW (ref >90)
Sodium: 140 mEq/L (ref 135–145)

## 2012-04-08 LAB — CBC
MCV: 83.6 fL (ref 78.0–100.0)
Platelets: 220 10*3/uL (ref 150–400)
RBC: 4.27 MIL/uL (ref 3.87–5.11)
RDW: 15.1 % (ref 11.5–15.5)
WBC: 6 10*3/uL (ref 4.0–10.5)

## 2012-04-08 LAB — POCT INR: INR: 2.8

## 2012-04-08 NOTE — H&P (Signed)
[name] 51 y.o. female. Who presents with heavy and irreg vaginal bleeding for one month.  .  She has uses 1-2 pads/ tampons every day.  She denies any CP or SOB.  Nothing makes it better.  Nothing makes it worse.  No dysmenorrhea.  Pt has tried provera without success. Pertinent Gynecological History: Contraception: Education given regarding options for contraception, including none. Blood transfusions: none Sexually transmitted diseases: none Previous GYN Procedures: BTL,  Last mammogram: 1/12 WNL Last pap: normal Date: 12/2011 WNL OB History: G3P2   Menstrual History: Menarche age: 51 No LMP recorded. Patient is not currently having periods (Reason: Perimenopausal).    Past Medical History  Diagnosis Date  . Asthma   . Hypertension   . CAD (coronary artery disease)     Cath 2010 with DES x 1 RCA  . Hyperlipidemia   . GERD (gastroesophageal reflux disease)   . Depression   . Diabetes mellitus 2010  . Arthritis     knees  . Abnormal uterine bleeding     last menstrual period 3 yrs ago.to be evalauated for uterine polyp  . Aortic stenosis, moderate     per 11/2011 echo  . Sleep apnea     never had sleep study  . Blood transfusion     2013[facility]  . Hepatitis   . Shortness of breath   . Chronic kidney disease     stones  . Headache     h/o migraines   Past Surgical History  Procedure Date  . Tubal ligation   . Carpal tunnel release     Bilateral  . Cholecystectomy   . Hernia repair   . Cardiac catheterization   . Knee arthroplasty 02/03/2012    Procedure: COMPUTER ASSISTED TOTAL KNEE ARTHROPLASTY;  Surgeon: [name], MD;  Location: [facility] OR;  Service: Orthopedics;  Laterality: Right;  Right total knee arthroplasty  . Coronary angioplasty     2010 has stent in place   Current outpatient prescriptions:albuterol (PROVENTIL HFA;VENTOLIN HFA) 108 (90 BASE) MCG/ACT inhaler, Inhale 1 puff into the lungs every 6 (six) hours as needed. Shortness of breath.  , Disp: , Rfl: ;  atorvastatin (LIPITOR) 40 MG tablet, Take 40 mg by mouth at bedtime. , Disp: , Rfl: ;  clopidogrel (PLAVIX) 75 MG tablet, Take 75 mg by mouth daily.  , Disp: , Rfl:  diclofenac (VOLTAREN) 25 MG EC tablet, Take 25 mg by mouth 2 (two) times daily. , Disp: , Rfl: ;  escitalopram (LEXAPRO) 20 MG tablet, Take 20 mg by mouth daily.  , Disp: , Rfl: ;  Fluticasone-Salmeterol (ADVAIR) 250-50 MCG/DOSE AEPB, Inhale 1 puff into the lungs 2 (two) times daily. , Disp: , Rfl: ;  furosemide (LASIX) 40 MG tablet, Take 1 tablet (40 mg total) by mouth 2 (two) times daily at 10 am and 4 pm., Disp: 60 tablet, Rfl: 0 gabapentin (NEURONTIN) 300 MG capsule, Take 300 mg by mouth 2 (two) times daily. Patient states she takes it once daily. Per patient., Disp: , Rfl: ;  glipiZIDE (GLUCOTROL) 5 MG tablet, Take 5 mg by mouth 2 (two) times daily before a meal.  , Disp: , Rfl: ;  hydrochlorothiazide (HYDRODIURIL) 25 MG tablet, Take 25 mg by mouth daily., Disp: , Rfl:  HYDROcodone-acetaminophen (NORCO) 7.5-325 MG per tablet, Take 1 tablet by mouth every 8 (eight) hours as needed. For pain, Disp: , Rfl: ;  losartan (COZAAR) 100 MG tablet, Take 100 mg by mouth daily., Disp: ,  Rfl: ;  methocarbamol (ROBAXIN) 500 MG tablet, Take 500 mg by mouth every 6 (six) hours as needed. For muscle spasms, Disp: , Rfl:  potassium chloride SA (K-DUR,KLOR-CON) 20 MEQ tablet, Take 2 tablets (40 mEq total) by mouth daily after breakfast., Disp: 30 tablet, Rfl: 0;  warfarin (COUMADIN) 10 MG tablet, Take 10 mg by mouth daily. Pt is currently taking 10mg -15mg  daily, rotates 10mg  & 15mg  but is unsure of the exact schedule., Disp: , Rfl: ;  DISCONTD: warfarin (COUMADIN) 10 MG tablet, Take as directed by anticoagulation clinic, Disp: 50 tablet, Rfl: 1 No Known Allergies Review of Systems - History obtained from the patient Endocrine ROS: negative Respiratory ROS: positive for - history of PE after knee surgery.  Pt now stable on plavix and  coumadin Cardiovascular ROS: no chest pain or dyspnea on exertion Gastrointestinal ROS: no abdominal pain, change in bowel habits, or black or bloody stools Genito-Urinary ROS: positive for - irregular/heavy menses   Physical Exam  BP 122/80  Pulse 70  Temp(Src) 99 F (37.2 C) (Oral)  Resp 18  Ht 5\' 4"  (1.626 m)  Wt 328 lb (148.78 kg)  BMI 56.30 kg/m2 Constitutional: She appears well-developed and well-nourished.  HENT:  Head: Normocephalic.  Eyes: Pupils are equal, round, and reactive to light.  Neck: Normal range of motion. Neck supple.  Cardiovascular: Regular rhythm.   Respiratory: Effort normal and breath sounds normal.  GI: Soft. obese, no organomegaly Genitourinary:normal vulva and vagina normal size uterus, no adnexal masses Musculoskeletal: Normal range of motion.  Neurological: She is alert.  Skin: Skin is warm.  Psychiatric: She has a normal mood and affect.  Results for orders placed during the hospital encounter of 04/08/12 (from the past 72 hour(s))  BASIC METABOLIC PANEL     Status: Abnormal   Collection Time   04/08/12  9:30 AM      Component Value Range Comment   Sodium 140  135 - 145 (mEq/L)    Potassium 4.2  3.5 - 5.1 (mEq/L)    Chloride 102  96 - 112 (mEq/L)    CO2 29  19 - 32 (mEq/L)    Glucose, Bld 101 (*) 70 - 99 (mg/dL)    BUN 16  6 - 23 (mg/dL)    Creatinine, Ser 3.08  0.50 - 1.10 (mg/dL)    Calcium 9.4  8.4 - 10.5 (mg/dL)    GFR calc non Af Amer 76 (*) >90 (mL/min)    GFR calc Af Amer 88 (*) >90 (mL/min)   CBC     Status: Abnormal   Collection Time   04/08/12  9:30 AM      Component Value Range Comment   WBC 6.0  4.0 - 10.5 (K/uL)    RBC 4.27  3.87 - 5.11 (MIL/uL)    Hemoglobin 11.2 (*) 12.0 - 15.0 (g/dL)    HCT 65.7 (*) 84.6 - 46.0 (%)    MCV 83.6  78.0 - 100.0 (fL)    MCH 26.2  26.0 - 34.0 (pg)    MCHC 31.4  30.0 - 36.0 (g/dL)    RDW 96.2  95.2 - 84.1 (%)    Platelets 220  150 - 400 (K/uL)    [place] width6.74  Length9.65cm multiple  endometrial masses seen on SHG consistent with polyps.  Ranging from 1.26 to 1.44 cm OvariesWNL  Assessment/Plan: Menopausal bleeding Endometrial polyps History of PE on coumadin and plavix Pt offered  obs vs surgery.  Pt chose surgery.  Plan D&C  hysteroscopy polypectomy.  Risks are but not limited to bleeding, infection, scarring of the uterus and perforation.  Pt will have medical clearance done.  She will stop plavix today and coumasin on Monday.  Her hematologist will detrmine if she needs SQ heparin or lovenox    Marlet Korte A 11/17/2011, 11:41 AM

## 2012-04-08 NOTE — Progress Notes (Signed)
Patient came to office for EKG,was shown to Dr.Mcalhany.Advised Dr.McAlhany's nurse will be contacting surgeons office to find out plan.

## 2012-04-08 NOTE — Patient Instructions (Signed)
Patient Education Materials to be provided at check out (*indicates is located in accordion folder):  Dilation and Curettage, Hysteroscopy

## 2012-04-08 NOTE — Telephone Encounter (Signed)
Per Marylu Lund pt is having surgery on the 18th with Dr. Dana Allan and patients last EKG was done in Feb and it shows a pro-longed OT and they want a more recent one to compare it to with her pcp because they are trying to get her cleared with them. Pt is coming today for a coumadin appt and they want [place] to do another EKG today and fax it to them when she comes in for the coumadin appt

## 2012-04-08 NOTE — Telephone Encounter (Signed)
Left message on [name]'s voicemail that we had not received any paperwork in office yet and asked her to refax. I left our phone number and fax number.

## 2012-04-08 NOTE — Telephone Encounter (Signed)
Patient came to office for EKG.EKG was shown to Dr.McAlhany.Dr.McAlhany's nurse will be contacting surgeons office to find out plan,

## 2012-04-08 NOTE — Progress Notes (Signed)
[name] y.o. female. Who presents with heavy and irreg vaginal bleeding for one month.  .  She has uses 1-2 pads/ tampons every day.  She denies any CP or SOB.  Nothing makes it better.  Nothing makes it worse.  No dysmenorrhea.  Pt has tried provera without success. Pertinent Gynecological History: Contraception: Education given regarding options for contraception, including none. Blood transfusions: none Sexually transmitted diseases: none Previous GYN Procedures: BTL,  Last mammogram: 1/12 WNL Last pap: normal Date: 12/2011 WNL OB History: G3P2   Menstrual History: Menarche age: 63 No LMP recorded. Patient is not currently having periods (Reason: Perimenopausal).    Past Medical History  Diagnosis Date  . Asthma   . Hypertension   . CAD (coronary artery disease)     Cath 2010 with DES x 1 RCA  . Hyperlipidemia   . GERD (gastroesophageal reflux disease)   . Depression   . Diabetes mellitus 2010  . Arthritis     knees  . Abnormal uterine bleeding     last menstrual period 3 yrs ago.to be evalauated for uterine polyp  . Aortic stenosis, moderate     per 11/2011 echo  . Sleep apnea     never had sleep study  . Blood transfusion     2013[facility]  . Hepatitis   . Shortness of breath   . Chronic kidney disease     stones  . Headache     h/o migraines   Past Surgical History  Procedure Date  . Tubal ligation   . Carpal tunnel release     Bilateral  . Cholecystectomy   . Hernia repair   . Cardiac catheterization   . Knee arthroplasty 02/03/2012    Procedure: COMPUTER ASSISTED TOTAL KNEE ARTHROPLASTY;  Surgeon: [name], MD;  Location: [facility] OR;  Service: Orthopedics;  Laterality: Right;  Right total knee arthroplasty  . Coronary angioplasty     2010 has stent in place   Current outpatient prescriptions:albuterol (PROVENTIL HFA;VENTOLIN HFA) 108 (90 BASE) MCG/ACT inhaler, Inhale 1 puff into the lungs every 6 (six) hours as needed. Shortness of  breath. , Disp: , Rfl: ;  atorvastatin (LIPITOR) 40 MG tablet, Take 40 mg by mouth at bedtime. , Disp: , Rfl: ;  clopidogrel (PLAVIX) 75 MG tablet, Take 75 mg by mouth daily.  , Disp: , Rfl:  diclofenac (VOLTAREN) 25 MG EC tablet, Take 25 mg by mouth 2 (two) times daily. , Disp: , Rfl: ;  escitalopram (LEXAPRO) 20 MG tablet, Take 20 mg by mouth daily.  , Disp: , Rfl: ;  Fluticasone-Salmeterol (ADVAIR) 250-50 MCG/DOSE AEPB, Inhale 1 puff into the lungs 2 (two) times daily. , Disp: , Rfl: ;  furosemide (LASIX) 40 MG tablet, Take 1 tablet (40 mg total) by mouth 2 (two) times daily at 10 am and 4 pm., Disp: 60 tablet, Rfl: 0 gabapentin (NEURONTIN) 300 MG capsule, Take 300 mg by mouth 2 (two) times daily. Patient states she takes it once daily. Per patient., Disp: , Rfl: ;  glipiZIDE (GLUCOTROL) 5 MG tablet, Take 5 mg by mouth 2 (two) times daily before a meal.  , Disp: , Rfl: ;  hydrochlorothiazide (HYDRODIURIL) 25 MG tablet, Take 25 mg by mouth daily., Disp: , Rfl:  HYDROcodone-acetaminophen (NORCO) 7.5-325 MG per tablet, Take 1 tablet by mouth every 8 (eight) hours as needed. For pain, Disp: , Rfl: ;  losartan (COZAAR) 100 MG tablet, Take 100 mg by mouth daily., Disp: ,  Rfl: ;  methocarbamol (ROBAXIN) 500 MG tablet, Take 500 mg by mouth every 6 (six) hours as needed. For muscle spasms, Disp: , Rfl:  potassium chloride SA (K-DUR,KLOR-CON) 20 MEQ tablet, Take 2 tablets (40 mEq total) by mouth daily after breakfast., Disp: 30 tablet, Rfl: 0;  warfarin (COUMADIN) 10 MG tablet, Take 10 mg by mouth daily. Pt is currently taking 10mg -15mg  daily, rotates 10mg  & 15mg  but is unsure of the exact schedule., Disp: , Rfl: ;  DISCONTD: warfarin (COUMADIN) 10 MG tablet, Take as directed by anticoagulation clinic, Disp: 50 tablet, Rfl: 1 No Known Allergies Review of Systems - History obtained from the patient Endocrine ROS: negative Respiratory ROS: positive for - history of PE after knee surgery.  Pt now stable on plavix and  coumadin Cardiovascular ROS: no chest pain or dyspnea on exertion Gastrointestinal ROS: no abdominal pain, change in bowel habits, or black or bloody stools Genito-Urinary ROS: positive for - irregular/heavy menses   Physical Exam  BP 122/80  Pulse 70  Temp(Src) 99 F (37.2 C) (Oral)  Resp 18  Ht 5\' 4"  (1.626 m)  Wt 328 lb (148.78 kg)  BMI 56.30 kg/m2 Constitutional: She appears well-developed and well-nourished.  HENT:  Head: Normocephalic.  Eyes: Pupils are equal, round, and reactive to light.  Neck: Normal range of motion. Neck supple.  Cardiovascular: Regular rhythm.   Respiratory: Effort normal and breath sounds normal.  GI: Soft. obese, no organomegaly Genitourinary:normal vulva and vagina normal size uterus, no adnexal masses Musculoskeletal: Normal range of motion.  Neurological: She is alert.  Skin: Skin is warm.  Psychiatric: She has a normal mood and affect.  Results for orders placed during the hospital encounter of 04/08/12 (from the past 72 hour(s))  BASIC METABOLIC PANEL     Status: Abnormal   Collection Time   04/08/12  9:30 AM      Component Value Range Comment   Sodium 140  135 - 145 (mEq/L)    Potassium 4.2  3.5 - 5.1 (mEq/L)    Chloride 102  96 - 112 (mEq/L)    CO2 29  19 - 32 (mEq/L)    Glucose, Bld 101 (*) 70 - 99 (mg/dL)    BUN 16  6 - 23 (mg/dL)    Creatinine, Ser 4.09  0.50 - 1.10 (mg/dL)    Calcium 9.4  8.4 - 10.5 (mg/dL)    GFR calc non Af Amer 76 (*) >90 (mL/min)    GFR calc Af Amer 88 (*) >90 (mL/min)   CBC     Status: Abnormal   Collection Time   04/08/12  9:30 AM      Component Value Range Comment   WBC 6.0  4.0 - 10.5 (K/uL)    RBC 4.27  3.87 - 5.11 (MIL/uL)    Hemoglobin 11.2 (*) 12.0 - 15.0 (g/dL)    HCT 81.1 (*) 91.4 - 46.0 (%)    MCV 83.6  78.0 - 100.0 (fL)    MCH 26.2  26.0 - 34.0 (pg)    MCHC 31.4  30.0 - 36.0 (g/dL)    RDW 78.2  95.6 - 21.3 (%)    Platelets 220  150 - 400 (K/uL)    [place] width6.74  Length9.65cm multiple  endometrial masses seen on SHG consistent with polyps.  Ranging from 1.26 to 1.44 cm OvariesWNL  Assessment/Plan: Menopausal bleeding Endometrial polyps History of PE on coumadin and plavix Pt offered  obs vs surgery.  Pt chose surgery.  Plan D&C  hysteroscopy polypectomy.  Risks are but not limited to bleeding, infection, scarring of the uterus and perforation.  Pt will have medical clearance done.  She will stop plavix today and coumasin on Monday.  Her hematologist will detrmine if she needs SQ heparin or lovenox    Miki Blank A 11/17/2011, 11:41 AM

## 2012-04-08 NOTE — Anesthesia Preprocedure Evaluation (Deleted)
Anesthesia Evaluation  Patient identified by MRN, date of birth, ID band Patient awake    Reviewed: Allergy & Precautions, H&P , NPO status , Patient's Chart, lab work & pertinent test results, reviewed documented beta blocker date and time   History of Anesthesia Complications Negative for: history of anesthetic complications  Airway Mallampati: I TM Distance: >3 FB Neck ROM: full    Dental  (+) Loose and Missing,    Pulmonary shortness of breath (unchanged over many years, relates to weight) and with exertion, asthma , sleep apnea (has never had a sleep study) , pneumonia  (01/2012), former smoker Pulmonary emboli after knee surgery in 01/2012.  Has been on coumadin.  Will stop 5 days prior to surgery. breath sounds clear to auscultation  Pulmonary exam normal       Cardiovascular hypertension, + CAD, + Past MI (2010), + Cardiac Stents (DES to RCA 2010.  On plavix - will hold starting today), +CHF (h/o, now managed on lasix, EF 60-65%) and + DOE + Valvular Problems/Murmurs (moderate AS (valve area 1.1 cm^2)) AS Rhythm:regular Rate:Normal + Systolic murmurs Echo 02/09/12 - EF 60-65%, grade 2 diastolic dysfunction, moderate aortic stenosis. EKG 02/08/12 - NSR   Neuro/Psych  Headaches (migraines), PSYCHIATRIC DISORDERS (depression)    GI/Hepatic GERD-  ,Possible episode of hepatitis related to bile duct obstruction by stone (now s/o lap chole)   Endo/Other  Diabetes mellitus-, Type 2, Oral Hypoglycemic AgentsMorbid obesity  Renal/GU H/o kidney stones  Female GU complaint     Musculoskeletal   Abdominal   Peds  Hematology Blood transfusion 01/2012   Anesthesia Other Findings REQUESTING THAT PATIENT SEE PCP PRIOR TO SURGERY FOR CLEARANCE.  Reproductive/Obstetrics negative OB ROS                           Anesthesia Physical Anesthesia Plan  ASA: III  Anesthesia Plan: General LMA   Post-op Pain  Management:    Induction:   Airway Management Planned:   Additional Equipment:   Intra-op Plan:   Post-operative Plan:   Informed Consent: I have reviewed the patients History and Physical, chart, labs and discussed the procedure including the risks, benefits and alternatives for the proposed anesthesia with the patient or authorized representative who has indicated his/her understanding and acceptance.   Dental Advisory Given  Plan Discussed with: CRNA and Surgeon  Anesthesia Plan Comments:         Anesthesia Quick Evaluation

## 2012-04-08 NOTE — Progress Notes (Deleted)
Patient ID: [name], female   DOB: Sep 25, 1961, 51 y.o.   MRN: [id number]  No chief complaint on file.   HPI         [name] is a 51 y.o. female.         HPI  Past Medical History  Diagnosis Date  . Asthma    . Hypertension   . CAD (coronary artery disease)     Cath 2010 with DES x 1 RCA  . Hyperlipidemia   . GERD (gastroesophageal reflux disease)   . Depression   . Diabetes mellitus 2010  . Arthritis     knees  . Abnormal uterine bleeding     last menstrual period 3 yrs ago.to be evalauated for uterine polyp  . Aortic stenosis, moderate     per 11/2011 echo  . Sleep apnea     never had sleep study  . Blood transfusion     2013[facility]  . Hepatitis   . Shortness of breath   . Chronic kidney disease     stones  . Headache     h/o migraines    Past Surgical History  Procedure Date  . Tubal ligation   . Carpal tunnel release     Bilateral  . Cholecystectomy   . Hernia repair   . Cardiac catheterization   . Knee arthroplasty 02/03/2012    Procedure: COMPUTER ASSISTED TOTAL KNEE ARTHROPLASTY;  Surgeon: [name], MD;  [facility]: [facility];  Service: Orthopedics;  Laterality: Right;  Right total knee arthroplasty  . Coronary angioplasty     2010 has stent in place    Family History  Problem Relation Age of Onset  . Cancer Mother     Breast  . COPD Father   . Cancer Brother     Sinus    Social History History  Substance Use Topics  . Smoking status: Former Smoker -- 1.5 packs/day for 30 years    Types: Cigarettes  . Smokeless tobacco: Not on file   Comment:  quit 12 -13 years ago  . Alcohol Use: No    No Known Allergies  Current Outpatient Prescriptions  Medication Sig Dispense Refill  . albuterol (PROVENTIL HFA;VENTOLIN HFA) 108 (90 BASE) MCG/ACT inhaler Inhale 1 puff into the lungs every 6 (six) hours as needed. Shortness of breath.       Marland Kitchen atorvastatin (LIPITOR) 40 MG tablet Take 40 mg by mouth at bedtime.       .  clopidogrel (PLAVIX) 75 MG tablet Take 75 mg by mouth daily.        . diclofenac (VOLTAREN) 25 MG EC tablet Take 25 mg by mouth 2 (two) times daily.        Marland Kitchen escitalopram (LEXAPRO) 20 MG tablet Take 20 mg by mouth daily.        . Fluticasone-Salmeterol (ADVAIR) 250-50 MCG/DOSE AEPB Inhale 1 puff into the lungs 2 (two) times daily.       . furosemide (LASIX) 40 MG tablet Take 1 tablet (40 mg total) by mouth 2 (two) times daily at 10 am and 4 pm.  60 tablet  0  . gabapentin (NEURONTIN) 300 MG capsule Take 300 mg by mouth 2 (two) times daily. Patient states she takes it once daily. Per patient.      Marland Kitchen glipiZIDE (GLUCOTROL) 5 MG tablet Take 5 mg by mouth 2 (two) times daily before a meal.        . hydrochlorothiazide (HYDRODIURIL) 25  MG tablet Take 25 mg by mouth daily.      Marland Kitchen HYDROcodone-acetaminophen (NORCO) 7.5-325 MG per tablet Take 1 tablet by mouth every 8 (eight) hours as needed. For pain      . losartan (COZAAR) 100 MG tablet Take 100 mg by mouth daily.      . methocarbamol (ROBAXIN) 500 MG tablet Take 500 mg by mouth every 6 (six) hours as needed. For muscle spasms      . potassium chloride SA (K-DUR,KLOR-CON) 20 MEQ tablet Take 2 tablets (40 mEq total) by mouth daily after breakfast.  30 tablet  0  . warfarin (COUMADIN) 10 MG tablet Take 10 mg by mouth daily. Pt is currently taking 10mg -15mg  daily, rotates 10mg  & 15mg  but is unsure of the exact schedule.      Marland Kitchen DISCONTD: warfarin (COUMADIN) 10 MG tablet Take as directed by anticoagulation clinic  50 tablet  1    Review of Systems Review of Systems  There were no vitals taken for this visit.  Physical Exam Physical Exam  Data Reviewed ***  Assessment    ***    Plan    ***       [name] L. 04/08/2012, 10:22 AM

## 2012-04-08 NOTE — Patient Instructions (Signed)
YOUR PROCEDURE IS SCHEDULED ON:04/15/12  ENTER THROUGH THE MAIN ENTRANCE OF [facility] AT:8am  USE DESK PHONE AND DIAL [id number] TO INFORM us OF YOUR ARRIVAL  CALL [phone] IF YOU HAVE ANY QUESTIONS OR PROBLEMS PRIOR TO YOUR ARRIVAL.  REMEMBER: DO NOT EAT OR DRINK AFTER MIDNIGHT :Wed  SPECIAL INSTRUCTIONS:bring inhaler to hospital   YOU MAY BRUSH YOUR TEETH THE MORNING OF SURGERY   TAKE THESE MEDICINES THE DAY OF SURGERY WITH SIP OF WATER:Lorsartan, Hydrochlorothiazide   DO NOT WEAR JEWELRY, EYE MAKEUP, LIPSTICK OR DARK FINGERNAIL POLISH DO NOT WEAR LOTIONS  DO NOT SHAVE FOR 48 HOURS PRIOR TO SURGERY  YOU WILL NOT BE ALLOWED TO DRIVE YOURSELF HOME.  NAME OF DRIVER:Philip

## 2012-04-09 ENCOUNTER — Telehealth: Payer: Self-pay

## 2012-04-09 NOTE — Telephone Encounter (Signed)
Tc to pt rgd cancelling surgery lm on vm tcb

## 2012-04-09 NOTE — Patient Instructions (Signed)
Called spoke with pt advised per Dr Clifton James it is not safe to hold her Coumadin for upcoming hystoscopy and D&C with Dr Normand Sloop on 04/15/12 given she has only had 2 mos Coumadin therapy post PE in 02/13.  Advised pt I have also called Dr Redmond Baseman office and spoke with her nurse Joni Reining and advised pt should not stop her Coumadin for any procedure that can be postponed as pt is at a very high risk to clot given that she is only 2 months out from last PE.  Advised pt to contact Dr Redmond Baseman office to find out if surgery can be done on Coumadin or if they need to cancel procedure scheduled for 04/15/12. Pt verbalized understanding.  Repeated to pt she is not to hold her Coumadin.

## 2012-04-09 NOTE — Telephone Encounter (Signed)
Spoke with Joni Reining, Dr Dillard's nurse advised per Dr Clifton James pt cannot stop her Coumadin. She is 2 months post PE, she needs a minimum of 6 mos Coumadin therapy after PE before it is safe to hold Coumadin unless surgery is absolutely medically necessary and benefits exceed risk of clotting.  Pt is at a very high risk of recurrent blood clot 2 mos post PE, and should not hold her coumadin.  Surgery should be postponed until September unless medically necessary, and if medically necessary pt needs OV with McAlhany to discuss risks of holding Coumadin. Joni Reining will notify Dr Normand Sloop pt cannot hold Coumadin. Will call back to discuss further if needed.

## 2012-04-15 ENCOUNTER — Encounter (HOSPITAL_COMMUNITY): Admission: RE | Payer: Self-pay | Source: Ambulatory Visit

## 2012-04-15 ENCOUNTER — Ambulatory Visit (HOSPITAL_COMMUNITY)
Admission: RE | Admit: 2012-04-15 | Payer: Medicaid Other | Source: Ambulatory Visit | Admitting: Obstetrics and Gynecology

## 2012-04-15 SURGERY — DILATATION & CURETTAGE/HYSTEROSCOPY WITH RESECTOCOPE
Anesthesia: Choice

## 2012-04-20 ENCOUNTER — Ambulatory Visit (INDEPENDENT_AMBULATORY_CARE_PROVIDER_SITE_OTHER): Payer: Medicaid Other | Admitting: *Deleted

## 2012-04-20 DIAGNOSIS — Z7901 Long term (current) use of anticoagulants: Secondary | ICD-10-CM

## 2012-04-20 DIAGNOSIS — I2699 Other pulmonary embolism without acute cor pulmonale: Secondary | ICD-10-CM

## 2012-04-20 LAB — POCT INR: INR: 1.6

## 2012-04-30 ENCOUNTER — Encounter: Payer: Self-pay | Admitting: Obstetrics and Gynecology

## 2012-05-06 ENCOUNTER — Ambulatory Visit (INDEPENDENT_AMBULATORY_CARE_PROVIDER_SITE_OTHER): Payer: Medicaid Other

## 2012-05-06 DIAGNOSIS — Z7901 Long term (current) use of anticoagulants: Secondary | ICD-10-CM

## 2012-05-06 DIAGNOSIS — I2699 Other pulmonary embolism without acute cor pulmonale: Secondary | ICD-10-CM

## 2012-05-06 LAB — POCT INR: INR: 3.5

## 2012-05-31 ENCOUNTER — Ambulatory Visit (INDEPENDENT_AMBULATORY_CARE_PROVIDER_SITE_OTHER): Payer: Medicaid Other | Admitting: Pharmacist

## 2012-05-31 DIAGNOSIS — Z7901 Long term (current) use of anticoagulants: Secondary | ICD-10-CM

## 2012-05-31 DIAGNOSIS — I2699 Other pulmonary embolism without acute cor pulmonale: Secondary | ICD-10-CM

## 2012-05-31 LAB — POCT INR: INR: 1.5

## 2012-06-15 ENCOUNTER — Telehealth: Payer: Self-pay | Admitting: Cardiovascular Disease

## 2012-06-15 NOTE — Telephone Encounter (Signed)
New msg Pt wants to get surgical clearance for another knee replacement for left knee. Please call

## 2012-06-15 NOTE — Telephone Encounter (Signed)
Spoke with pt who is due for 6 month follow up appt with Dr. Clifton James. She is also having a lot of knee pain and would like to discuss surgical clearance for knee replacement surgery. Appt made for July 09, 2012 at 3:00

## 2012-06-25 ENCOUNTER — Ambulatory Visit (INDEPENDENT_AMBULATORY_CARE_PROVIDER_SITE_OTHER): Payer: Medicaid Other

## 2012-06-25 DIAGNOSIS — I2699 Other pulmonary embolism without acute cor pulmonale: Secondary | ICD-10-CM

## 2012-06-25 DIAGNOSIS — Z7901 Long term (current) use of anticoagulants: Secondary | ICD-10-CM

## 2012-07-09 ENCOUNTER — Telehealth: Payer: Self-pay | Admitting: Physician Assistant

## 2012-07-09 ENCOUNTER — Encounter: Payer: Self-pay | Admitting: Cardiovascular Disease

## 2012-07-09 ENCOUNTER — Ambulatory Visit (INDEPENDENT_AMBULATORY_CARE_PROVIDER_SITE_OTHER): Payer: Medicaid Other | Admitting: Pharmacist

## 2012-07-09 ENCOUNTER — Ambulatory Visit (INDEPENDENT_AMBULATORY_CARE_PROVIDER_SITE_OTHER): Payer: Medicaid Other | Admitting: Cardiovascular Disease

## 2012-07-09 VITALS — BP 139/76 | HR 80 | Ht 63.0 in | Wt 334.0 lb

## 2012-07-09 DIAGNOSIS — Z86711 Personal history of pulmonary embolism: Secondary | ICD-10-CM | POA: Insufficient documentation

## 2012-07-09 DIAGNOSIS — I2699 Other pulmonary embolism without acute cor pulmonale: Secondary | ICD-10-CM

## 2012-07-09 DIAGNOSIS — I2510 Atherosclerotic heart disease of native coronary artery without angina pectoris: Secondary | ICD-10-CM

## 2012-07-09 DIAGNOSIS — Z7901 Long term (current) use of anticoagulants: Secondary | ICD-10-CM

## 2012-07-09 LAB — BASIC METABOLIC PANEL
BUN: 19 mg/dL (ref 6–23)
CO2: 33 mEq/L — ABNORMAL HIGH (ref 19–32)
Calcium: 9.2 mg/dL (ref 8.4–10.5)
Creat: 0.87 mg/dL (ref 0.50–1.10)
Glucose, Bld: 66 mg/dL — ABNORMAL LOW (ref 70–99)
Sodium: 141 mEq/L (ref 135–145)

## 2012-07-09 NOTE — Progress Notes (Signed)
History of Present Illness: 51 yo WF with history of CAD, OSA, HTN, HLD, obesity, GERD, asthma, COPD, former tobacco abuse, depression here today for cardiac follow up. I saw her for the first time in December 2012.  She has been seen in the past in [facility] and Vascular. She had a cardiac cath in February 2010 and had a drug eluting stent (2.61mm Xience) placed in the distal RCA per Dr. Elsie Lincoln. Her LAD and Circumflex were free of disease. She had her right knee replaced in February 8,2013. She developed SOB on 02/08/12 and was found to have a PE and pneumonia. She was started on coumadin. She has been followed in our coumadin clinic.   She is here today for cardiac follow up.  She has had no chest pains. She has baseline SOB. This seems to be affected by the weather. Her left knee aches. She has plans for left knee surgery and wants to know if she can stop coumadin (PE in February 2013 following Right knee replacement).   Primary Care Physician: Della Goo  Last Lipid Profile: Needs updating  Past Medical History  Diagnosis Date  . Asthma   . Hypertension   . CAD (coronary artery disease)     Cath 2010 with DES x 1 RCA  . Hyperlipidemia   . GERD (gastroesophageal reflux disease)   . Depression   . Diabetes mellitus 2010  . Arthritis     knees  . Abnormal uterine bleeding     last menstrual period 3 yrs ago.to be evalauated for uterine polyp  . Aortic stenosis, moderate     per 11/2011 echo  . Sleep apnea     never had sleep study  . Blood transfusion     2013[facility]  . Hepatitis   . Shortness of breath   . Chronic kidney disease     stones  . Headache     h/o migraines    Past Surgical History  Procedure Date  . Tubal ligation   . Carpal tunnel release     Bilateral  . Cholecystectomy   . Hernia repair   . Cardiac catheterization   . Knee arthroplasty 02/03/2012    Procedure: COMPUTER ASSISTED TOTAL KNEE ARTHROPLASTY;  Surgeon: [name], MD;   Location: [facility] OR;  Service: Orthopedics;  Laterality: Right;  Right total knee arthroplasty  . Coronary angioplasty     2010 has stent in place    Current Outpatient Prescriptions  Medication Sig Dispense Refill  . albuterol (PROVENTIL HFA;VENTOLIN HFA) 108 (90 BASE) MCG/ACT inhaler Inhale 1 puff into the lungs every 6 (six) hours as needed. Shortness of breath.       Marland Kitchen atorvastatin (LIPITOR) 40 MG tablet Take 40 mg by mouth at bedtime.       . clopidogrel (PLAVIX) 75 MG tablet Take 75 mg by mouth daily.        . diclofenac (VOLTAREN) 25 MG EC tablet Take 25 mg by mouth 2 (two) times daily.       Marland Kitchen escitalopram (LEXAPRO) 20 MG tablet Take 20 mg by mouth daily.        . Fluticasone-Salmeterol (ADVAIR) 250-50 MCG/DOSE AEPB Inhale 1 puff into the lungs 2 (two) times daily.       . furosemide (LASIX) 40 MG tablet Take 1 tablet (40 mg total) by mouth 2 (two) times daily at 10 am and 4 pm.  60 tablet  0  . gabapentin (NEURONTIN) 300 MG capsule  Take 300 mg by mouth 2 (two) times daily. Patient states she takes it once daily. Per patient.      Marland Kitchen glipiZIDE (GLUCOTROL) 5 MG tablet Take 5 mg by mouth 2 (two) times daily before a meal.        . hydrochlorothiazide (HYDRODIURIL) 25 MG tablet Take 25 mg by mouth daily.      Marland Kitchen HYDROcodone-acetaminophen (NORCO) 7.5-325 MG per tablet Take 1 tablet by mouth every 8 (eight) hours as needed. For pain      . losartan (COZAAR) 100 MG tablet Take 100 mg by mouth daily.      . methocarbamol (ROBAXIN) 500 MG tablet Take 500 mg by mouth every 6 (six) hours as needed. For muscle spasms      . potassium chloride SA (K-DUR,KLOR-CON) 20 MEQ tablet Take 2 tablets (40 mEq total) by mouth daily after breakfast.  30 tablet  0  . warfarin (COUMADIN) 10 MG tablet Take 10 mg by mouth daily. Pt is currently taking 10mg -15mg  daily, rotates 10mg  & 15mg  but is unsure of the exact schedule.        No Known Allergies  History   Social History  . Marital Status: Married    Spouse  Name: N/A    Number of Children: 2  . Years of Education: N/A   Occupational History  . Disabled    Social History Main Topics  . Smoking status: Former Smoker -- 1.5 packs/day for 30 years    Types: Cigarettes  . Smokeless tobacco: Not on file   Comment:  quit 12 -13 years ago  . Alcohol Use: No  . Drug Use: No  . Sexually Active: Yes    Birth Control/ Protection: Surgical   Other Topics Concern  . Not on file   Social History Narrative  . No narrative on file    Family History  Problem Relation Age of Onset  . Cancer Mother     Breast  . COPD Father   . Cancer Brother     Sinus    Review of Systems:  As stated in the HPI and otherwise negative.   BP 139/76  Pulse 80  Ht 5\' 3"  (1.6 m)  Wt 334 lb (151.501 kg)  BMI 59.17 kg/m2  LMP 07/26/2009  Physical Examination: General: Well developed, well nourished, NAD HEENT: OP clear, mucus membranes moist SKIN: warm, dry. No rashes. Neuro: No focal deficits Musculoskeletal: Muscle strength 5/5 all ext Psychiatric: Mood and affect normal Neck: No JVD, no carotid bruits, no thyromegaly, no lymphadenopathy. Lungs:Clear bilaterally, no wheezes, rhonci, crackles Cardiovascular: Regular rate and rhythm. No murmurs, gallops or rubs. Abdomen:Soft. Bowel sounds present. Non-tender.  Extremities: Trace bilateral  lower extremity edema. Pulses are hard to palpate secondary to her size.

## 2012-07-09 NOTE — Assessment & Plan Note (Signed)
Stable. BP well controlled. Will check lipids and LFTS on return in 5-6 weeks. No change in meds. Will discuss functional status at f/u and make recs for risk stratification before surgery.

## 2012-07-09 NOTE — Assessment & Plan Note (Signed)
She will need a full 6 months of coumadin for provoked PE, likely from DVT post knee replacement. Will continue until 08/07/12, then repeat CTA chest after 08/07/12 to look for residual PE. If no PE present, will clear to stop coumadin for left knee replacement.

## 2012-07-09 NOTE — Patient Instructions (Addendum)
Your physician recommends that you schedule a follow-up appointment at the end of August.  Fasting Lipid and Liver profiles will be drawn at this appt.  Non-Cardiac CT Angiography (CTA), is a special type of CT scan that uses a computer to produce multi-dimensional views of major blood vessels throughout the body. In CT angiography, a contrast material is injected through an IV to help visualize the blood vessels. Do not eat or drink for 2 hours prior to CT.  CTA to be done after August 07, 2012

## 2012-07-09 NOTE — Telephone Encounter (Signed)
Received call from [facility] that patient's glucose was 66. I called pt, she feels well, no complaints. She notes this happens occasionally and she has to eat to bring it up. I advised her to make sure to eat a snack now. If her CBG remains low this evening she should skip her glipizide dose tonight and touch base with primary doctor to determine if her oral meds should be adjusted. She verbalized understand and gratitude.  Dayna Dunn PA-C

## 2012-08-09 ENCOUNTER — Ambulatory Visit (INDEPENDENT_AMBULATORY_CARE_PROVIDER_SITE_OTHER): Payer: Medicaid Other | Admitting: *Deleted

## 2012-08-09 ENCOUNTER — Ambulatory Visit (INDEPENDENT_AMBULATORY_CARE_PROVIDER_SITE_OTHER)
Admission: RE | Admit: 2012-08-09 | Discharge: 2012-08-09 | Disposition: A | Discharge status: Home / Self Care | Payer: Medicaid Other | Source: Ambulatory Visit | Attending: Cardiovascular Disease | Admitting: Cardiovascular Disease

## 2012-08-09 DIAGNOSIS — I2699 Other pulmonary embolism without acute cor pulmonale: Secondary | ICD-10-CM

## 2012-08-09 DIAGNOSIS — Z7901 Long term (current) use of anticoagulants: Secondary | ICD-10-CM

## 2012-08-09 LAB — POCT INR: INR: 3.8

## 2012-08-09 IMAGING — CT CT ANGIO CHEST
2 of 6 series · 18 of 36 positions shown · IV contrast (Omnipaque 300)
Comparison: Chest CT - [DATE];

CLINICAL DATA: Evaluate for resolution of known pulmonary embolism,
chronic shortness of breath

CT ANGIOGRAPHY CHEST
TECHNIQUE: Multidetector CT imaging of the chest using the
standard protocol during bolus administration of intravenous
contrast. Multiplanar reconstructed images including MIPs were
obtained and reviewed to evaluate the vascular anatomy.
Contrast: 80mL OMNIPAQUE IOHEXOL 300 MG/ML  SOLN

[Series 6: thins (id) / (id) · axial · 0.77mm/px · z∈[+893,+1141]mm · 17 of 276 slices shown]
[im 14/276  lung]
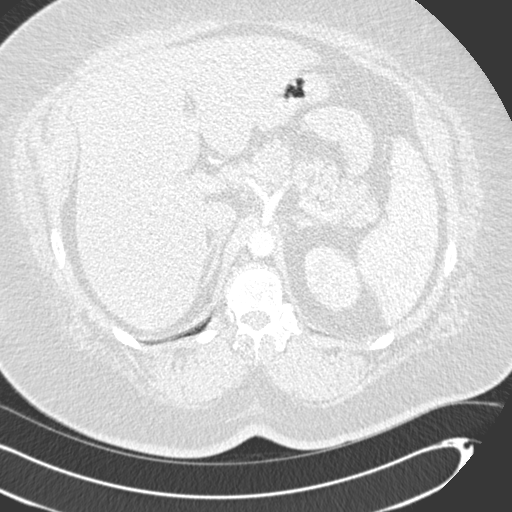
[im 28/276  mediastinal]
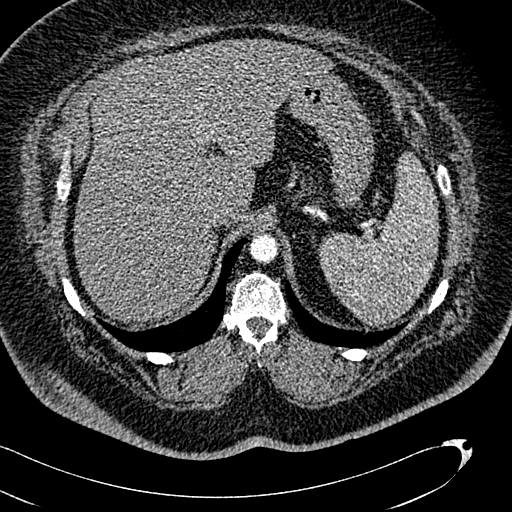
[im 42/276  lung]
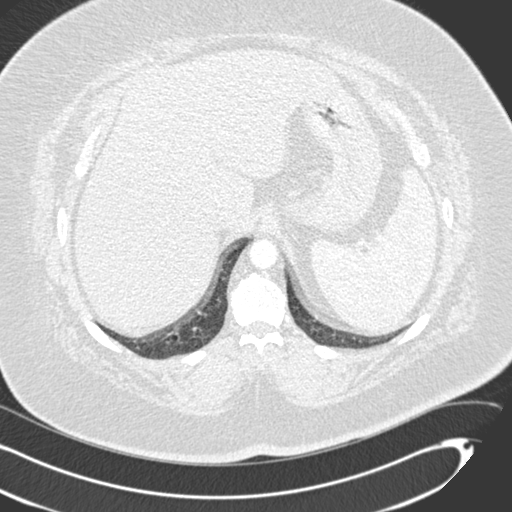
[im 56/276  mediastinal]
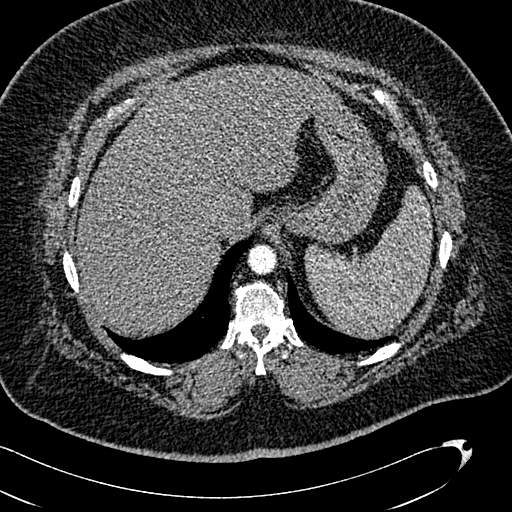
[im 83/276  lung]
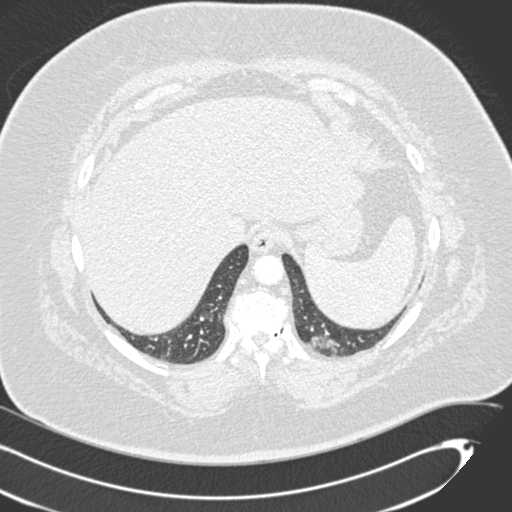
[im 97/276  mediastinal]
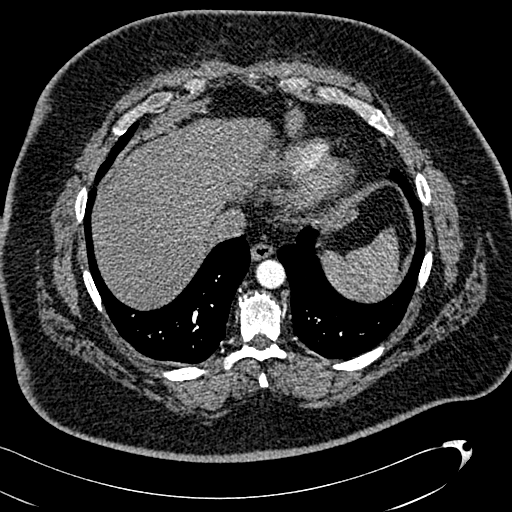
[im 111/276  lung]
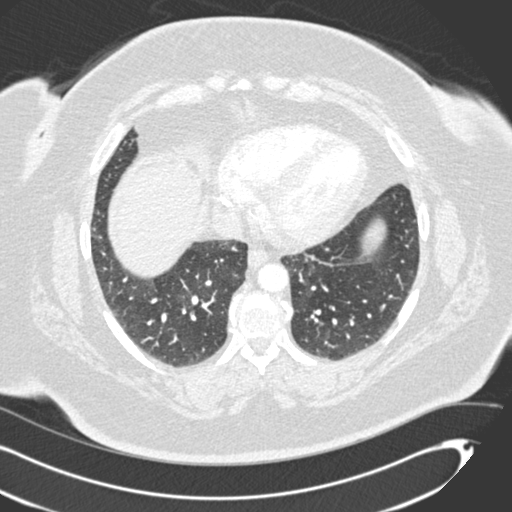
[im 124/276  mediastinal]
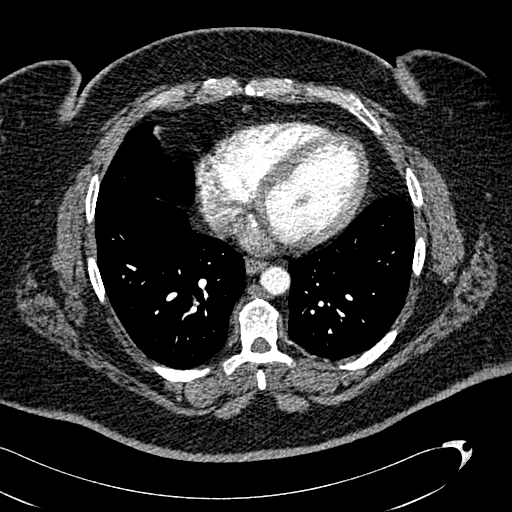
[im 138/276  lung]
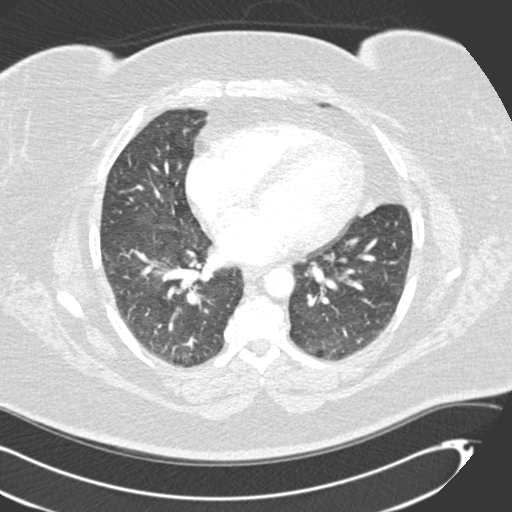
[im 152/276  mediastinal]
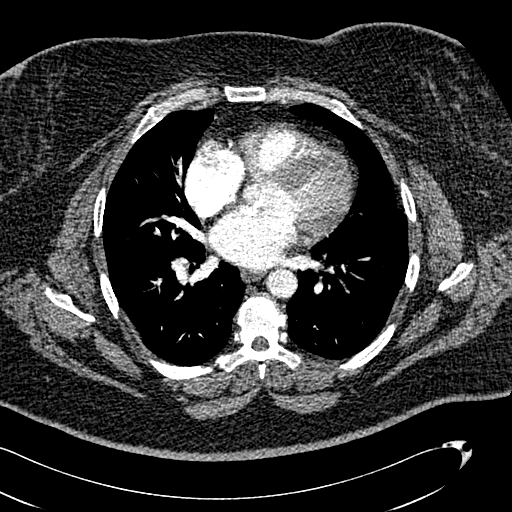
[im 166/276  lung]
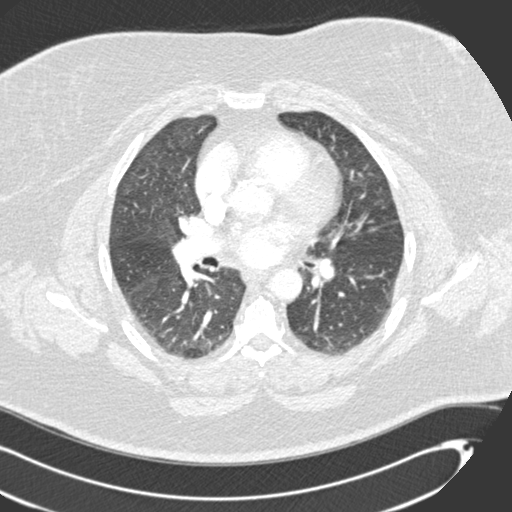
[im 179/276  mediastinal]
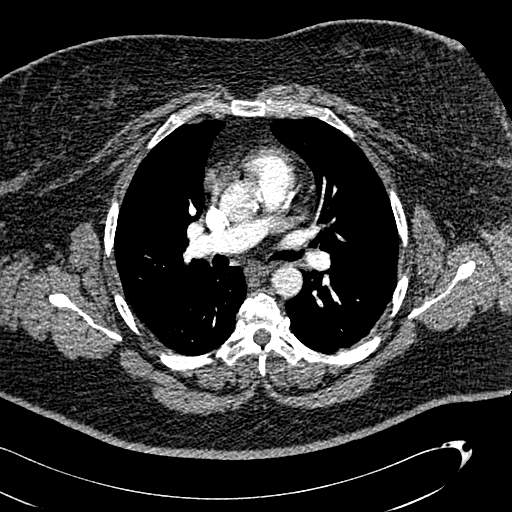
[im 193/276  lung]
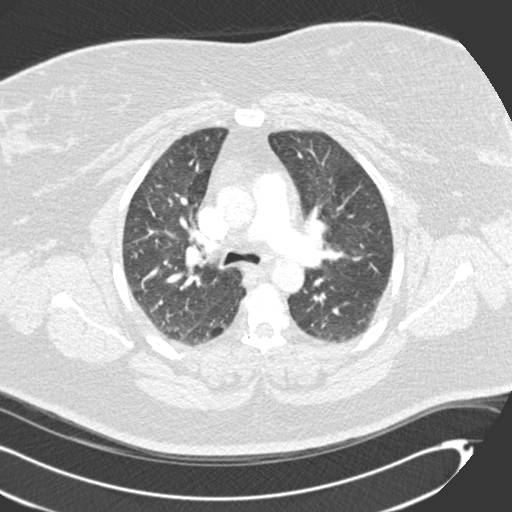
[im 221/276  mediastinal]
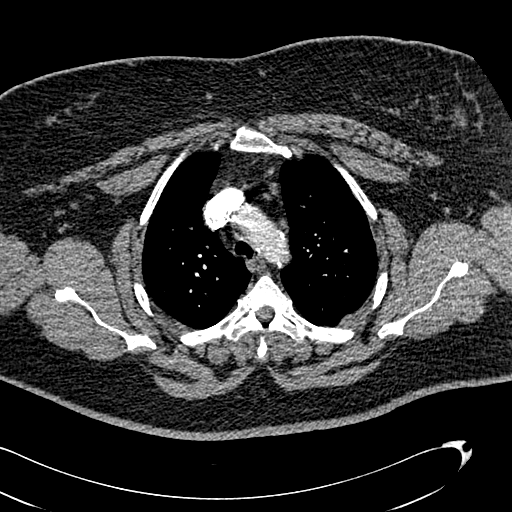
[im 234/276  lung]
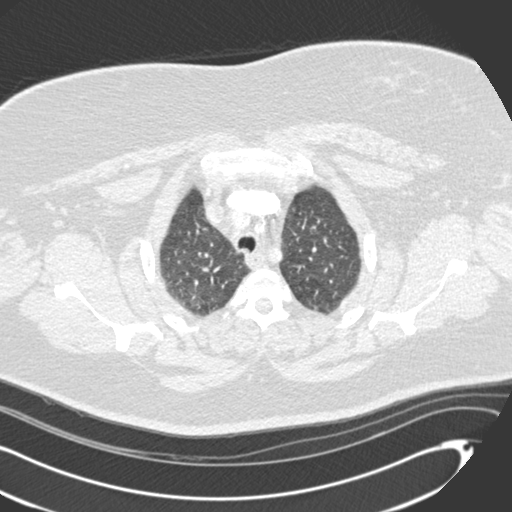
[im 248/276  mediastinal]
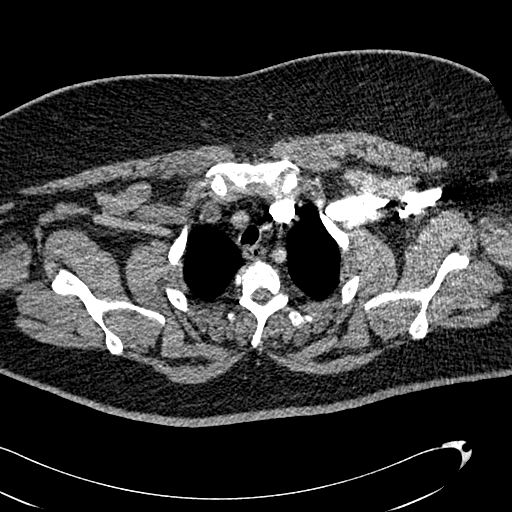
[im 262/276  lung]
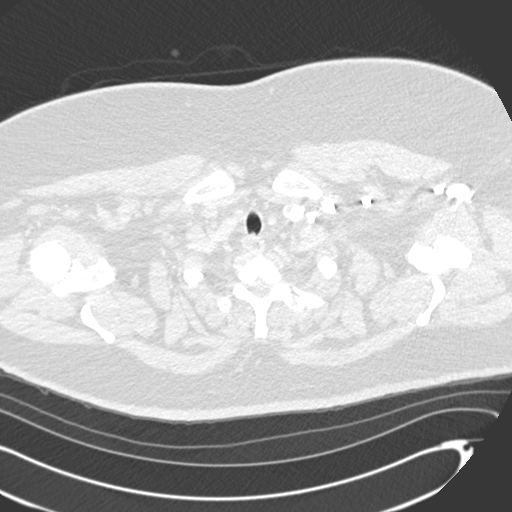

[Series 602: cor mpr · coronal · 0.77mm/px · 1 of 112 slices shown]
[im 56/112  mediastinal]
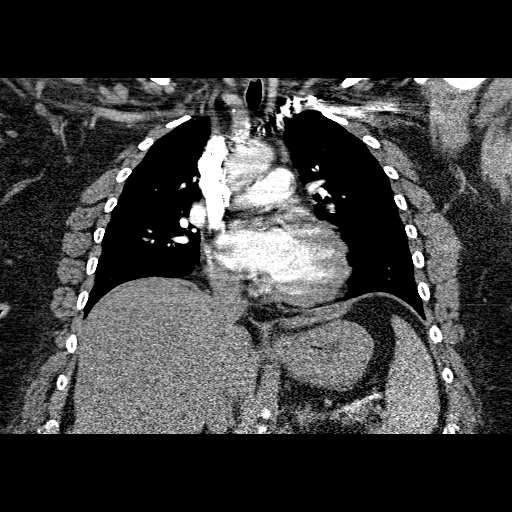

[18 of 36 positions shown; findings below may reference images not displayed]

[DATE]

Vascular findings:

There is suboptimal opacification of the pulmonary vasculature with
the main pulmonary artery measuring only 226 HU.  Given this
limitation, there is no discrete filling defect within the central
pulmonary arterial tree to suggest either acute or chronic
pulmonary embolism.  Evaluation of the subsegmental pulmonary
arteries is degraded secondary to suboptimal vessel opacification,
patient body habitus and motion artifact.  Normal caliber of the
main pulmonary artery.

Borderline cardiomegaly.  Coronary artery calcifications.  No
pericardial effusion.  Aortic valvular calcifications.

Normal caliber of the thoracic aorta.  There is scattered
atherosclerotic calcifications within the aortic arch and
descending thoracic aorta.  Conventional configuration of the
aortic arch.  Visualized portions of the cervical vasculature are
patent.  No periaortic stranding.

Nonvascular findings:

Interval resolution of previously noted extensive bilateral
perihilar predominant heterogeneous consolidative opacities.  There
are minimal linear heterogeneous opacities in the inferior segment
of the lingula, favored to represent subsegmental atelectasis.
There is minimal dependent ground-glass opacities favored represent
atelectasis.  No focal airspace opacities.  No pleural effusion or
pneumothorax.  The central airways are patent.

Grossly unchanged appearance of approximately 1.5 x 2.6 cm fluid
density structure within the anterior mediastinum again favored to
represent a thymic or pericardial cyst (image 28, series five).
Shoddy prevascular lymph nodes are not enlarged by CT criteria.  No
mediastinal, hilar or axillary lymphadenopathy.

Early arterial phase evaluation of the upper abdomen is normal.

No acute or aggressive osseous abnormalities.  Mild multilevel
thoracic spine degenerative change.
IMPRESSION: 1.  No evidence of acute or chronic pulmonary embolism to the level
of the bilateral subsegmental pulmonary arteries.

2.  Interval resolution of extensive bilateral perihilar,
heterogeneous and solid opacities suggest resolved infection.
3.  Borderline coronary cardiomegaly with coronary artery and
aortic valvular calcifications.  Further evaluation cardiac echo
may be normal as clinically indicated.

4.  Unchanged fluid density lesion within the anterior mediastinum,
stable since [W9] examination and again favored to represent either
a pericardial or thymic cyst.

## 2012-08-09 MED ORDER — IOHEXOL 300 MG/ML  SOLN
80.0000 mL | Freq: Once | INTRAMUSCULAR | Status: AC | PRN
  Administered 2012-08-09: 80 mL via INTRAVENOUS

## 2012-08-10 ENCOUNTER — Other Ambulatory Visit: Payer: Self-pay | Admitting: Cardiovascular Disease

## 2012-08-11 ENCOUNTER — Other Ambulatory Visit: Payer: Self-pay | Admitting: *Deleted

## 2012-08-11 ENCOUNTER — Ambulatory Visit: Payer: Self-pay | Admitting: Pharmacist

## 2012-08-11 ENCOUNTER — Telehealth: Payer: Self-pay | Admitting: Cardiovascular Disease

## 2012-08-11 DIAGNOSIS — I2699 Other pulmonary embolism without acute cor pulmonale: Secondary | ICD-10-CM

## 2012-08-11 DIAGNOSIS — Z7901 Long term (current) use of anticoagulants: Secondary | ICD-10-CM

## 2012-08-11 NOTE — Telephone Encounter (Signed)
Spoke with pt and reviewed CT results and gave her instructions from Dr. Clifton James that she could stop Coumadin.  Her surgeon is Dr. Rayburn Ma at [facility] ortho.  I will send CT report and note from last office visit with Dr. Clifton James to Dr. Rayburn Ma at pt's request.  Pt will keep follow up appt with Dr. Clifton James on August 25, 2012 to review results.

## 2012-08-11 NOTE — Telephone Encounter (Signed)
Pt calling re results of CT done 08-09-12, pls call [phone]

## 2012-08-19 ENCOUNTER — Other Ambulatory Visit (HOSPITAL_COMMUNITY): Payer: Self-pay | Admitting: Orthopaedic Surgery

## 2012-08-23 ENCOUNTER — Other Ambulatory Visit: Payer: Medicaid Other

## 2012-08-25 ENCOUNTER — Other Ambulatory Visit: Payer: Self-pay | Admitting: *Deleted

## 2012-08-25 ENCOUNTER — Ambulatory Visit (INDEPENDENT_AMBULATORY_CARE_PROVIDER_SITE_OTHER): Payer: Medicaid Other | Admitting: Cardiovascular Disease

## 2012-08-25 ENCOUNTER — Encounter: Payer: Self-pay | Admitting: Cardiovascular Disease

## 2012-08-25 VITALS — BP 134/76 | HR 82 | Ht 63.0 in | Wt 334.0 lb

## 2012-08-25 DIAGNOSIS — I359 Nonrheumatic aortic valve disorder, unspecified: Secondary | ICD-10-CM

## 2012-08-25 DIAGNOSIS — I2510 Atherosclerotic heart disease of native coronary artery without angina pectoris: Secondary | ICD-10-CM

## 2012-08-25 DIAGNOSIS — E785 Hyperlipidemia, unspecified: Secondary | ICD-10-CM

## 2012-08-25 DIAGNOSIS — Z01810 Encounter for preprocedural cardiovascular examination: Secondary | ICD-10-CM

## 2012-08-25 DIAGNOSIS — I350 Nonrheumatic aortic (valve) stenosis: Secondary | ICD-10-CM

## 2012-08-25 DIAGNOSIS — R9431 Abnormal electrocardiogram [ECG] [EKG]: Secondary | ICD-10-CM

## 2012-08-25 LAB — HEPATIC FUNCTION PANEL
ALT: 15 U/L (ref 0–35)
AST: 15 U/L (ref 0–37)
Albumin: 3.6 g/dL (ref 3.5–5.2)
Alkaline Phosphatase: 68 U/L (ref 39–117)
Bilirubin, Direct: 0.1 mg/dL (ref 0.0–0.3)
Total Protein: 7.2 g/dL (ref 6.0–8.3)

## 2012-08-25 MED ORDER — ATORVASTATIN CALCIUM 80 MG PO TABS: 80.0000 mg | ORAL_TABLET | Freq: Every day | ORAL | Status: DC

## 2012-08-25 MED ORDER — ASPIRIN EC 81 MG PO TBEC: 81.0000 mg | DELAYED_RELEASE_TABLET | Freq: Every day | ORAL | Status: DC

## 2012-08-25 NOTE — Progress Notes (Signed)
History of Present Illness: 51 yo WF with history of CAD, OSA, HTN, HLD, obesity, GERD, asthma, COPD, former tobacco abuse, depression here today for cardiac follow up. I saw her for the first time in December 2012. She has been seen in the past in [facility] and Vascular. She had a cardiac cath in February 2010 and had a drug eluting stent (2.70mm Xience) placed in the distal RCA per Dr. Elsie Lincoln. Her LAD and Circumflex were free of disease. She had her right knee replaced in February 8,2013. She developed SOB on 02/08/12 and was found to have a PE and pneumonia. She was started on coumadin. She has been followed in our coumadin clinic. I saw her last in July 2013 and she was doing well with no chest pains and just baseline SOB. Plans for repeat CTA August to assess for residual PE. Her CTA on 08/09/12 showed no evidence of PE.   She is here today for cardiac follow up. She has had no chest pains. She has baseline SOB.  Her left knee aches. She has plans for left knee surgery. She stopped her coumadin last week per our recommendations .  Primary Care Physician: Della Goo   Last Lipid Profile: Will update today.   Past Medical History  Diagnosis Date  . Asthma   . Hypertension   . CAD (coronary artery disease)     Cath 2010 with DES x 1 RCA  . Hyperlipidemia   . GERD (gastroesophageal reflux disease)   . Depression   . Diabetes mellitus 2010  . Arthritis     knees  . Abnormal uterine bleeding     last menstrual period 3 yrs ago.to be evalauated for uterine polyp  . Aortic stenosis, moderate     per 11/2011 echo  . Sleep apnea     never had sleep study  . Blood transfusion     2013[facility]  . Hepatitis   . Shortness of breath   . Chronic kidney disease     stones  . Headache     h/o migraines  . Pulmonary embolism     Past Surgical History  Procedure Date  . Tubal ligation   . Carpal tunnel release     Bilateral  . Cholecystectomy   . Hernia repair   . Cardiac  catheterization   . Knee arthroplasty 02/03/2012    Procedure: COMPUTER ASSISTED TOTAL KNEE ARTHROPLASTY;  Surgeon: [name], MD;  Location: [facility] OR;  Service: Orthopedics;  Laterality: Right;  Right total knee arthroplasty  . Coronary angioplasty     2010 has stent in place    Current Outpatient Prescriptions  Medication Sig Dispense Refill  . albuterol (PROVENTIL HFA;VENTOLIN HFA) 108 (90 BASE) MCG/ACT inhaler Inhale 1 puff into the lungs every 6 (six) hours as needed. Shortness of breath.       Marland Kitchen atorvastatin (LIPITOR) 40 MG tablet Take 40 mg by mouth at bedtime.       . clopidogrel (PLAVIX) 75 MG tablet Take 75 mg by mouth daily.        . diclofenac (VOLTAREN) 25 MG EC tablet Take 25 mg by mouth 2 (two) times daily.       Marland Kitchen escitalopram (LEXAPRO) 20 MG tablet Take 20 mg by mouth daily.        . Fluticasone-Salmeterol (ADVAIR) 250-50 MCG/DOSE AEPB Inhale 1 puff into the lungs 2 (two) times daily.       . furosemide (LASIX) 40 MG tablet  Take 1 tablet (40 mg total) by mouth 2 (two) times daily at 10 am and 4 pm.  60 tablet  0  . gabapentin (NEURONTIN) 300 MG capsule Take 300 mg by mouth 2 (two) times daily. Patient states she takes it once daily. Per patient.      Marland Kitchen glipiZIDE (GLUCOTROL) 5 MG tablet Take 5 mg by mouth 2 (two) times daily before a meal.        . hydrochlorothiazide (HYDRODIURIL) 25 MG tablet Take 25 mg by mouth daily.      Marland Kitchen HYDROcodone-acetaminophen (NORCO) 7.5-325 MG per tablet Take 1 tablet by mouth every 8 (eight) hours as needed. For pain      . losartan (COZAAR) 100 MG tablet Take 100 mg by mouth daily.      . methocarbamol (ROBAXIN) 500 MG tablet Take 500 mg by mouth every 6 (six) hours as needed. For muscle spasms      . potassium chloride SA (K-DUR,KLOR-CON) 20 MEQ tablet Take 2 tablets (40 mEq total) by mouth daily after breakfast.  30 tablet  0    No Known Allergies  History   Social History  . Marital Status: Married    Spouse Name: N/A    Number  of Children: 2  . Years of Education: N/A   Occupational History  . Disabled    Social History Main Topics  . Smoking status: Former Smoker -- 1.5 packs/day for 30 years    Types: Cigarettes  . Smokeless tobacco: Not on file   Comment:  quit 12 -13 years ago  . Alcohol Use: No  . Drug Use: No  . Sexually Active: Yes    Birth Control/ Protection: Surgical   Other Topics Concern  . Not on file   Social History Narrative  . No narrative on file    Family History  Problem Relation Age of Onset  . Cancer Mother     Breast  . COPD Father   . Cancer Brother     Sinus    Review of Systems:  As stated in the HPI and otherwise negative.   BP 134/76  Pulse 82  Ht 5\' 3"  (1.6 m)  Wt 334 lb (151.501 kg)  BMI 59.17 kg/m2  LMP 07/26/2009  Physical Examination: General: Well developed, well nourished, NAD HEENT: OP clear, mucus membranes moist SKIN: warm, dry. No rashes. Neuro: No focal deficits Musculoskeletal: Muscle strength 5/5 all ext Psychiatric: Mood and affect normal Neck: No JVD, no carotid bruits, no thyromegaly, no lymphadenopathy. Lungs:Clear bilaterally, no wheezes, rhonci, crackles Cardiovascular: Regular rate and rhythm. No murmurs, gallops or rubs. Abdomen:Soft. Bowel sounds present. Non-tender.  Extremities: No lower extremity edema. Pulses are 2 + in the bilateral DP/PT.  CT chest 08/09/12: 1. No evidence of acute or chronic pulmonary embolism to the level of the bilateral subsegmental pulmonary arteries.  2. Interval resolution of extensive bilateral perihilar, heterogeneous and solid opacities suggest resolved infection. 3. Borderline coronary cardiomegaly with coronary artery and aortic valvular calcifications. Further evaluation cardiac echo may be normal as clinically indicated.  4. Unchanged fluid density lesion within the anterior mediastinum, stable since 2010 examination and again favored to represent either a pericardial or thymic  cyst.   Assessment and Plan:   1. CAD: Stable. No chest pains. Will add ASA 81 mg po DQdaily. She will hold Plavix for 7 days before her knee surgery. Continue statin and ARB. No beta blocker secondary to asthma/COPD. Will check lipids and LFTs today. BP  well controlled.   2. Aortic valve stenosis: Moderate by echo in February 2013. Will need repeat echo in February 2014. ? If this is due to a bicuspid valve.   3. Pulmonary embolism: No residual PE on CTA 08/09/12. Coumadin stopped. I do not think she needs further coumadin therapy. If she has recurrent DVT, will need to have consideration for lifelong coumadin therapy.   4. Pre-operative cardiovascular examination: She has stable CAD. Would proceed with surgery as planned. Would continue ASA 81 mg po Qdaily during per-operative period. OK to hold Plavix one week before surgery. No ischemic testing warranted at this time.

## 2012-08-25 NOTE — Patient Instructions (Addendum)
Your physician wants you to follow-up in:  6 months.  You will receive a reminder letter in the mail two months in advance. If you don't receive a letter, please call our office to schedule the follow-up appointment.  Your physician has requested that you have an echocardiogram. Echocardiography is a painless test that uses sound waves to create images of your heart. It provides your doctor with information about the size and shape of your heart and how well your heart's chambers and valves are working. This procedure takes approximately one hour. There are no restrictions for this procedure. To be done in 6 months--week or so prior to appt with Dr. Clifton James   Your physician has recommended you make the following change in your medication: Start aspirin 81 mg by mouth daily.

## 2012-09-03 ENCOUNTER — Encounter (HOSPITAL_COMMUNITY): Payer: Self-pay | Admitting: Pharmacy Technician

## 2012-09-03 ENCOUNTER — Encounter (HOSPITAL_COMMUNITY)
Admission: RE | Admit: 2012-09-03 | Discharge: 2012-09-03 | Disposition: A | Discharge status: Home / Self Care | Payer: Medicaid Other | Source: Ambulatory Visit | Attending: Orthopaedic Surgery | Admitting: Orthopaedic Surgery

## 2012-09-03 ENCOUNTER — Encounter (HOSPITAL_COMMUNITY): Payer: Self-pay

## 2012-09-03 HISTORY — DX: Cardiac murmur, unspecified: R01.1

## 2012-09-03 HISTORY — DX: Weakness: R53.1

## 2012-09-03 HISTORY — DX: Restless legs syndrome: G25.81

## 2012-09-03 HISTORY — DX: Leiomyoma of uterus, unspecified: D25.9

## 2012-09-03 HISTORY — DX: Acute myocardial infarction, unspecified: I21.9

## 2012-09-03 LAB — BASIC METABOLIC PANEL
BUN: 16 mg/dL (ref 6–23)
Chloride: 98 mEq/L (ref 96–112)
GFR calc Af Amer: 88 mL/min — ABNORMAL LOW (ref >90)
GFR calc non Af Amer: 76 mL/min — ABNORMAL LOW (ref >90)
Glucose, Bld: 169 mg/dL — ABNORMAL HIGH (ref 70–99)
Potassium: 3.8 mEq/L (ref 3.5–5.1)
Sodium: 140 mEq/L (ref 135–145)

## 2012-09-03 LAB — URINALYSIS, ROUTINE W REFLEX MICROSCOPIC
Glucose, UA: NEGATIVE mg/dL
Hgb urine dipstick: NEGATIVE
Leukocytes, UA: NEGATIVE
pH: 7 (ref 5.0–8.0)

## 2012-09-03 LAB — PROTIME-INR
INR: 0.95 (ref 0.00–1.49)
Prothrombin Time: 12.9 seconds (ref 11.6–15.2)

## 2012-09-03 LAB — CBC
HCT: 35.6 % — ABNORMAL LOW (ref 36.0–46.0)
Hemoglobin: 11.8 g/dL — ABNORMAL LOW (ref 12.0–15.0)
RBC: 4.27 MIL/uL (ref 3.87–5.11)

## 2012-09-03 LAB — APTT: aPTT: 28 seconds (ref 24–37)

## 2012-09-03 LAB — SURGICAL PCR SCREEN: MRSA, PCR: NEGATIVE

## 2012-09-03 NOTE — Progress Notes (Signed)
09/03/12 1130  OBSTRUCTIVE SLEEP APNEA  Have you ever been diagnosed with sleep apnea through a sleep study? No  Do you snore loudly (loud enough to be heard through closed doors)?  1  Do you often feel tired, fatigued, or sleepy during the daytime? 1  Has anyone observed you stop breathing during your sleep? 1  Do you have, or are you being treated for high blood pressure? 1  BMI more than 35 kg/m2? 1  Age over 53 years old? 1  Neck circumference greater than 40 cm/18 inches? 1  Gender: 0  Obstructive Sleep Apnea Score 7   Score 4 or greater  Updated health history;Results sent to PCP

## 2012-09-03 NOTE — Patient Instructions (Addendum)
YOUR SURGERY IS SCHEDULED ON:  Friday  9/13  AT 10:05 AM  REPORT TO [place] [facility] AT:  8:00 AM      PHONE # FOR SHORT STAY IS [phone]  DO NOT EAT OR DRINK ANYTHING AFTER MIDNIGHT THE NIGHT BEFORE YOUR SURGERY.  YOU MAY BRUSH YOUR TEETH, RINSE OUT YOUR MOUTH--BUT NO WATER, NO FOOD, NO CHEWING GUM, NO MINTS, NO CANDIES, NO CHEWING TOBACCO.  PLEASE TAKE THE FOLLOWING MEDICATIONS THE AM OF YOUR SURGERY WITH A FEW SIPS OF WATER:   LEXAPRO, GABAPENTIN.  USE YOUR ALBUTEROL AND ADVAIR INHALERS AND BRING YOUR ALBUTEROL INHALER TO THE HOSPITAL TO TAKE TO SURGERY.    IF YOU USE INHALERS--USE YOUR INHALERS THE AM OF YOUR SURGERY AND BRING INHALERS TO THE HOSPITAL -TAKE TO SURGERY.    IF YOU ARE DIABETIC:  DO NOT TAKE ANY DIABETIC MEDICATIONS THE AM OF YOUR SURGERY.  IF YOU TAKE INSULIN IN THE EVENINGS--PLEASE ONLY TAKE 1/2 NORMAL EVENING DOSE THE NIGHT BEFORE YOUR SURGERY.  NO INSULIN THE AM OF YOUR SURGERY.  IF YOU HAVE SLEEP APNEA AND USE CPAP OR BIPAP--PLEASE BRING THE MASK --NOT THE MACHINE-NOT THE TUBING   -JUST THE MASK. DO NOT BRING VALUABLES, MONEY, CREDIT CARDS.  CONTACT LENS, DENTURES / PARTIALS, GLASSES SHOULD NOT BE WORN TO SURGERY AND IN MOST CASES-HEARING AIDS WILL NEED TO BE REMOVED.  BRING YOUR GLASSES CASE, ANY EQUIPMENT NEEDED FOR YOUR CONTACT LENS. FOR PATIENTS ADMITTED TO THE HOSPITAL--CHECK OUT TIME THE DAY OF DISCHARGE IS 11:00 AM.  ALL INPATIENT ROOMS ARE PRIVATE - WITH BATHROOM, TELEPHONE, TELEVISION AND WIFI INTERNET. IF YOU ARE BEING DISCHARGED THE SAME DAY OF YOUR SURGERY--YOU CAN NOT DRIVE YOURSELF HOME--AND SHOULD NOT GO HOME ALONE BY TAXI OR BUS.  NO DRIVING OR OPERATING MACHINERY FOR 24 HOURS FOLLOWING ANESTHESIA / PAIN MEDICATIONS.                            SPECIAL INSTRUCTIONS:  CHLORHEXIDINE SOAP SHOWER (other brand names are Betasept and Hibiclens ) PLEASE SHOWER WITH CHLORHEXIDINE THE NIGHT BEFORE YOUR SURGERY AND THE AM OF YOUR SURGERY. DO NOT USE  CHLORHEXIDINE ON YOUR FACE OR PRIVATE AREAS--YOU MAY USE YOUR NORMAL SOAP THOSE AREAS AND YOUR NORMAL SHAMPOO.  WOMEN SHOULD AVOID SHAVING UNDER ARMS AND SHAVING LEGS 48 HOURS BEFORE USING CHLORHEXIDINE TO AVOID SKIN IRRITATION.  DO NOT USE IF ALLERGIC TO CHLORHEXIDINE.  PLEASE READ OVER ANY  FACT SHEETS THAT YOU WERE GIVEN: MRSA INFORMATION, BLOOD TRANSFUSION INFORMATION.

## 2012-09-03 NOTE — Pre-Procedure Instructions (Signed)
PREOP CBC, BMET, PT, PTT, UA, T/S WERE DONE TODAY AT [facility].  PT HAS A CHEST CT REPORT 08/10/11 AND HAS CARDIOLOGY OFFICE NOTE 08/25/12 WITH CLEARANCE FROM DR. MCALHANY FOR HER LEFT KNEE SURGERY.  THERE IS AN EKG REPORT FROM  04-08-12, A 2-D ECHO REPORT 02/09/12.  ALL REPORTS ARE IN EPIC. PREOP INSTRUCTIONS DISCUSSED WITH PT USING TEACH BACK METHOD.

## 2012-09-10 ENCOUNTER — Encounter (HOSPITAL_COMMUNITY): Payer: Self-pay | Admitting: Anesthesiology

## 2012-09-10 ENCOUNTER — Encounter (HOSPITAL_COMMUNITY): Payer: Self-pay | Admitting: *Deleted

## 2012-09-10 ENCOUNTER — Ambulatory Visit (HOSPITAL_COMMUNITY): Payer: Medicaid Other

## 2012-09-10 ENCOUNTER — Encounter (HOSPITAL_COMMUNITY)
Admission: RE | Disposition: A | Discharge status: Home Health | Payer: Self-pay | Source: Ambulatory Visit | Attending: Orthopaedic Surgery

## 2012-09-10 ENCOUNTER — Ambulatory Visit (HOSPITAL_COMMUNITY): Payer: Medicaid Other | Admitting: Anesthesiology

## 2012-09-10 ENCOUNTER — Inpatient Hospital Stay (HOSPITAL_COMMUNITY)
Admission: RE | Admit: 2012-09-10 | Discharge: 2012-09-14 | DRG: 470 | Disposition: A | Discharge status: Home Health | Payer: Medicaid Other | Source: Ambulatory Visit | Attending: Orthopaedic Surgery | Admitting: Orthopaedic Surgery

## 2012-09-10 DIAGNOSIS — E785 Hyperlipidemia, unspecified: Secondary | ICD-10-CM

## 2012-09-10 DIAGNOSIS — M1710 Unilateral primary osteoarthritis, unspecified knee: Principal | ICD-10-CM | POA: Diagnosis present

## 2012-09-10 DIAGNOSIS — E119 Type 2 diabetes mellitus without complications: Secondary | ICD-10-CM | POA: Diagnosis present

## 2012-09-10 DIAGNOSIS — F329 Major depressive disorder, single episode, unspecified: Secondary | ICD-10-CM | POA: Diagnosis present

## 2012-09-10 DIAGNOSIS — Z96659 Presence of unspecified artificial knee joint: Secondary | ICD-10-CM

## 2012-09-10 DIAGNOSIS — M6530 Trigger finger, unspecified finger: Secondary | ICD-10-CM | POA: Diagnosis present

## 2012-09-10 DIAGNOSIS — F3289 Other specified depressive episodes: Secondary | ICD-10-CM | POA: Diagnosis present

## 2012-09-10 DIAGNOSIS — Z6841 Body Mass Index (BMI) 40.0 and over, adult: Secondary | ICD-10-CM

## 2012-09-10 DIAGNOSIS — M1712 Unilateral primary osteoarthritis, left knee: Secondary | ICD-10-CM

## 2012-09-10 DIAGNOSIS — Z01812 Encounter for preprocedural laboratory examination: Secondary | ICD-10-CM

## 2012-09-10 DIAGNOSIS — D62 Acute posthemorrhagic anemia: Secondary | ICD-10-CM | POA: Diagnosis not present

## 2012-09-10 DIAGNOSIS — I2510 Atherosclerotic heart disease of native coronary artery without angina pectoris: Secondary | ICD-10-CM | POA: Diagnosis present

## 2012-09-10 DIAGNOSIS — K219 Gastro-esophageal reflux disease without esophagitis: Secondary | ICD-10-CM | POA: Diagnosis present

## 2012-09-10 DIAGNOSIS — I252 Old myocardial infarction: Secondary | ICD-10-CM

## 2012-09-10 HISTORY — PX: TRIGGER FINGER RELEASE: SHX641

## 2012-09-10 HISTORY — PX: TOTAL KNEE ARTHROPLASTY: SHX125

## 2012-09-10 LAB — GLUCOSE, CAPILLARY: Glucose-Capillary: 210 mg/dL — ABNORMAL HIGH (ref 70–99)

## 2012-09-10 LAB — TYPE AND SCREEN
ABO/RH(D): A POS
Antibody Screen: NEGATIVE

## 2012-09-10 IMAGING — CR DG KNEE 1-2V PORT*L*
1 series · 2 of 2 positions shown · non-contrast
Comparison: None.

CLINICAL DATA: Left knee arthroplasty.

PORTABLE LEFT KNEE - 1-2 VIEW

[Series 1: AP · left · 2 of 2 slices shown]
[im 1/2]
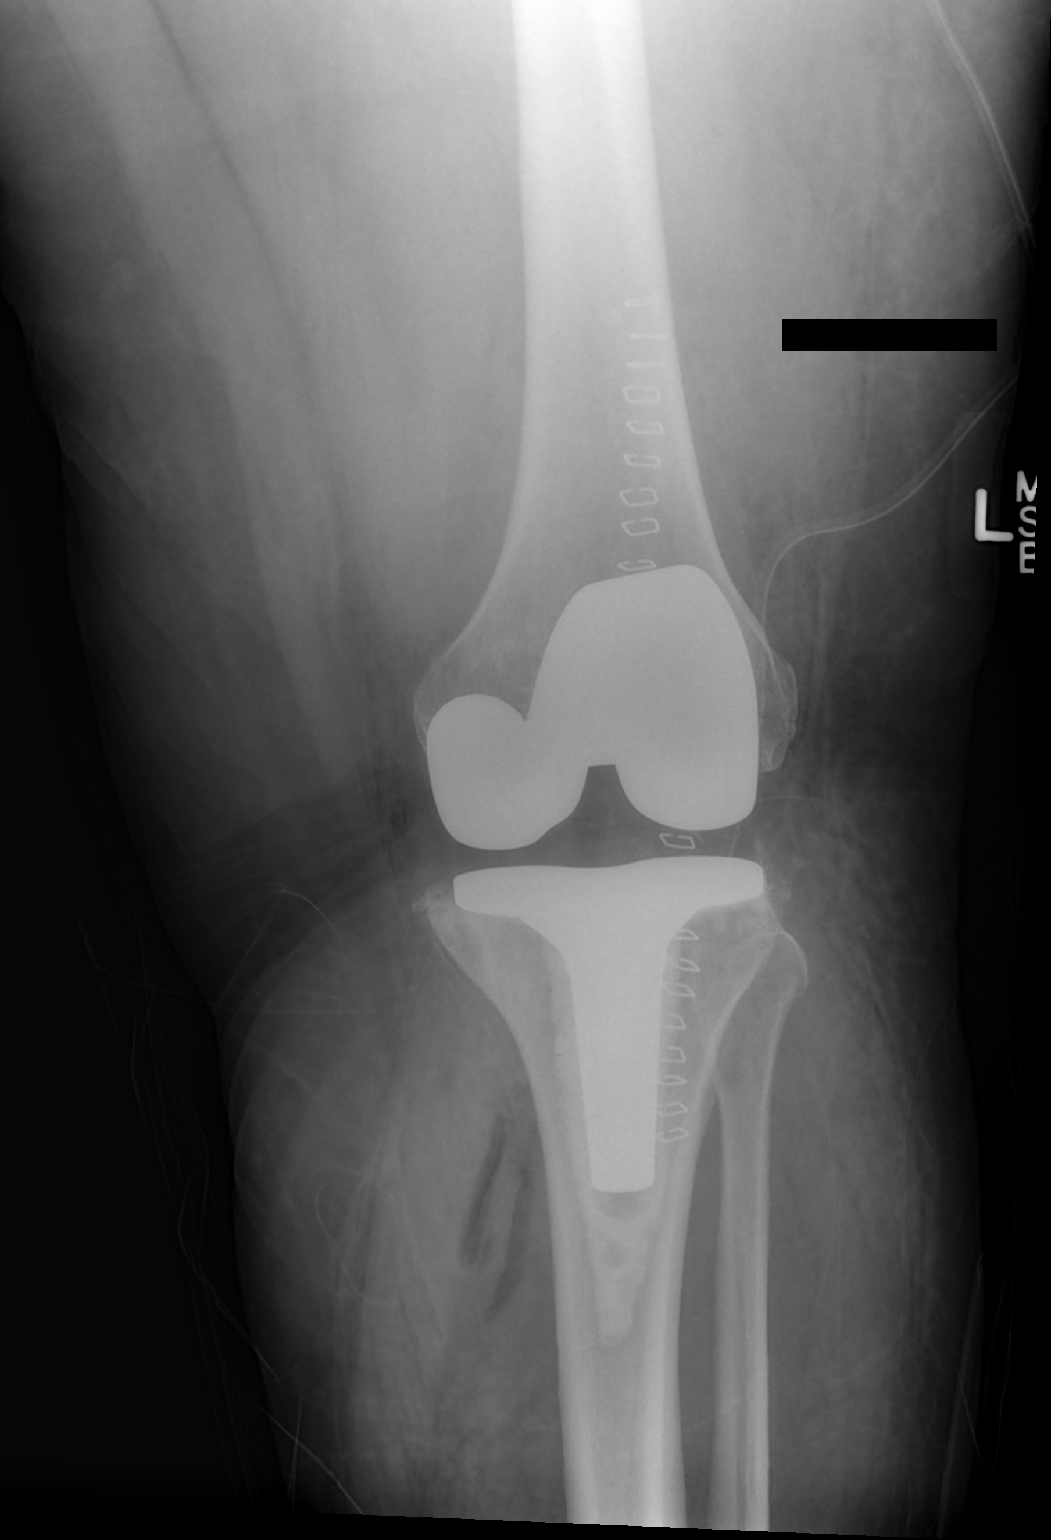
[im 2/2]
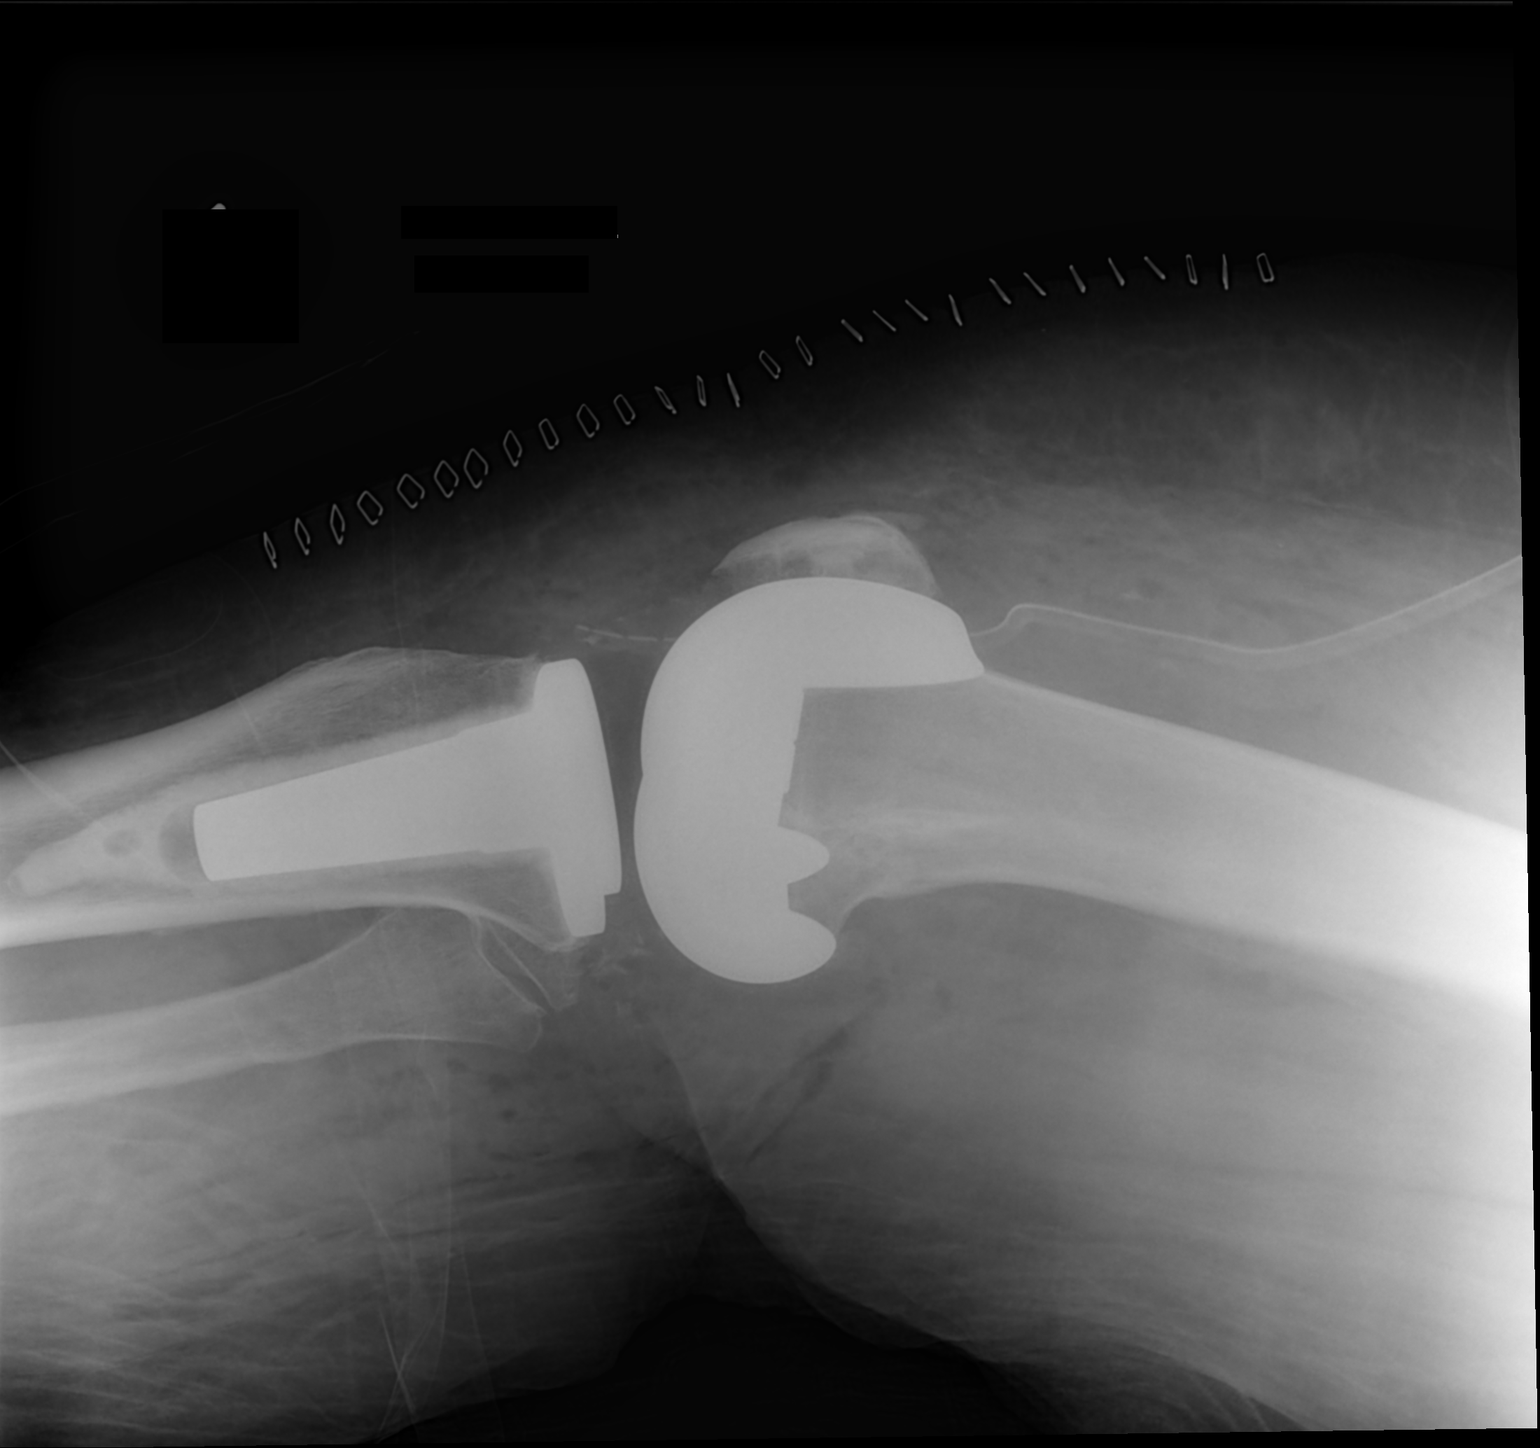

[2 of 2 positions shown; findings below may reference images not displayed]

FINDINGS: Two views of the knee demonstrate a total knee
arthroplasty.  Surgical drain is in place.  Surgical skin staples
present. Expected postoperative soft tissue changes.  No evidence
for a periprosthetic fracture. The knee is located.
IMPRESSION: Left knee arthroplasty without complicating features.

## 2012-09-10 SURGERY — ARTHROPLASTY, KNEE, TOTAL
Anesthesia: General | Site: Knee | Laterality: Right | Wound class: Clean

## 2012-09-10 MED ORDER — DIPHENHYDRAMINE HCL 12.5 MG/5ML PO ELIX: 12.5000 mg | ORAL_SOLUTION | ORAL | Status: DC | PRN

## 2012-09-10 MED ORDER — ALBUTEROL SULFATE HFA 108 (90 BASE) MCG/ACT IN AERS: 1.0000 | INHALATION_SPRAY | Freq: Four times a day (QID) | RESPIRATORY_TRACT | Status: DC | PRN

## 2012-09-10 MED ORDER — LACTATED RINGERS IV SOLN
INTRAVENOUS | Status: DC | PRN
  Administered 2012-09-10 (×3): via INTRAVENOUS

## 2012-09-10 MED ORDER — GLYCOPYRROLATE 0.2 MG/ML IJ SOLN
INTRAMUSCULAR | Status: DC | PRN
  Administered 2012-09-10: 0.4 mg via INTRAVENOUS

## 2012-09-10 MED ORDER — METHOCARBAMOL 100 MG/ML IJ SOLN
500.0000 mg | Freq: Four times a day (QID) | INTRAVENOUS | Status: DC | PRN
  Administered 2012-09-10: 500 mg via INTRAVENOUS
  Filled 2012-09-10: qty 5

## 2012-09-10 MED ORDER — BUPIVACAINE HCL (PF) 0.25 % IJ SOLN
INTRAMUSCULAR | Status: DC | PRN
  Administered 2012-09-10: 6 mL

## 2012-09-10 MED ORDER — LABETALOL HCL 5 MG/ML IV SOLN
INTRAVENOUS | Status: DC | PRN
  Administered 2012-09-10: 5 mg via INTRAVENOUS

## 2012-09-10 MED ORDER — ALUM & MAG HYDROXIDE-SIMETH 200-200-20 MG/5ML PO SUSP: 30.0000 mL | ORAL | Status: DC | PRN

## 2012-09-10 MED ORDER — HYDROMORPHONE HCL PF 1 MG/ML IJ SOLN
0.2500 mg | INTRAMUSCULAR | Status: DC | PRN
  Administered 2012-09-10 (×2): 0.5 mg via INTRAVENOUS

## 2012-09-10 MED ORDER — METHOCARBAMOL 500 MG PO TABS
500.0000 mg | ORAL_TABLET | Freq: Four times a day (QID) | ORAL | Status: DC | PRN
  Administered 2012-09-10 – 2012-09-13 (×7): 500 mg via ORAL
  Filled 2012-09-10 (×8): qty 1

## 2012-09-10 MED ORDER — 0.9 % SODIUM CHLORIDE (POUR BTL) OPTIME
TOPICAL | Status: DC | PRN
  Administered 2012-09-10: 1000 mL

## 2012-09-10 MED ORDER — ATORVASTATIN CALCIUM 80 MG PO TABS
80.0000 mg | ORAL_TABLET | Freq: Every day | ORAL | Status: DC
  Administered 2012-09-10 – 2012-09-13 (×4): 80 mg via ORAL
  Filled 2012-09-10 (×5): qty 1

## 2012-09-10 MED ORDER — DEXTROSE 5 % IV SOLN
3.0000 g | INTRAVENOUS | Status: AC
  Administered 2012-09-10: 3 g via INTRAVENOUS
  Filled 2012-09-10: qty 3000

## 2012-09-10 MED ORDER — ACETAMINOPHEN 10 MG/ML IV SOLN: 1000.0000 mg | Freq: Once | INTRAVENOUS | Status: DC | PRN

## 2012-09-10 MED ORDER — ZOLPIDEM TARTRATE 5 MG PO TABS: 5.0000 mg | ORAL_TABLET | Freq: Every evening | ORAL | Status: DC | PRN

## 2012-09-10 MED ORDER — PROMETHAZINE HCL 25 MG/ML IJ SOLN: 6.2500 mg | INTRAMUSCULAR | Status: DC | PRN

## 2012-09-10 MED ORDER — FUROSEMIDE 40 MG PO TABS
40.0000 mg | ORAL_TABLET | Freq: Two times a day (BID) | ORAL | Status: DC
  Administered 2012-09-10 – 2012-09-14 (×8): 40 mg via ORAL
  Filled 2012-09-10 (×9): qty 1

## 2012-09-10 MED ORDER — CLOPIDOGREL BISULFATE 75 MG PO TABS
75.0000 mg | ORAL_TABLET | Freq: Every day | ORAL | Status: DC
  Administered 2012-09-11 – 2012-09-14 (×4): 75 mg via ORAL
  Filled 2012-09-10 (×4): qty 1

## 2012-09-10 MED ORDER — DIPHENHYDRAMINE HCL 50 MG/ML IJ SOLN: 12.5000 mg | Freq: Four times a day (QID) | INTRAMUSCULAR | Status: DC | PRN

## 2012-09-10 MED ORDER — ROPIVACAINE HCL 5 MG/ML IJ SOLN
INTRAMUSCULAR | Status: AC
  Filled 2012-09-10: qty 30

## 2012-09-10 MED ORDER — SODIUM CHLORIDE 0.9 % IR SOLN
Status: DC | PRN
  Administered 2012-09-10: 3000 mL

## 2012-09-10 MED ORDER — SUCCINYLCHOLINE CHLORIDE 20 MG/ML IJ SOLN
INTRAMUSCULAR | Status: DC | PRN
  Administered 2012-09-10: 160 mg via INTRAVENOUS

## 2012-09-10 MED ORDER — MORPHINE SULFATE (PF) 1 MG/ML IV SOLN
INTRAVENOUS | Status: AC
  Administered 2012-09-10: 3 mg via INTRAVENOUS
  Administered 2012-09-10: 14:00:00 via INTRAVENOUS
  Administered 2012-09-11 (×2): 3 mg via INTRAVENOUS
  Administered 2012-09-11: 7.5 mg via INTRAVENOUS
  Administered 2012-09-11: 16:00:00 via INTRAVENOUS
  Administered 2012-09-11: 3 mg via INTRAVENOUS
  Administered 2012-09-11: 1.5 mg via INTRAVENOUS
  Administered 2012-09-11: 3.78 mg via INTRAVENOUS
  Administered 2012-09-12: 4.5 mg via INTRAVENOUS
  Filled 2012-09-10: qty 25

## 2012-09-10 MED ORDER — ESMOLOL HCL 10 MG/ML IV SOLN
INTRAVENOUS | Status: DC | PRN
  Administered 2012-09-10: 20 mg via INTRAVENOUS

## 2012-09-10 MED ORDER — INSULIN ASPART 100 UNIT/ML ~~LOC~~ SOLN: 0.0000 [IU] | Freq: Every day | SUBCUTANEOUS | Status: DC

## 2012-09-10 MED ORDER — CEFAZOLIN SODIUM-DEXTROSE 2-3 GM-% IV SOLR
2.0000 g | Freq: Four times a day (QID) | INTRAVENOUS | Status: AC
  Administered 2012-09-10 (×2): 2 g via INTRAVENOUS
  Filled 2012-09-10 (×2): qty 50

## 2012-09-10 MED ORDER — PHENOL 1.4 % MT LIQD: 1.0000 | OROMUCOSAL | Status: DC | PRN

## 2012-09-10 MED ORDER — MEPERIDINE HCL 50 MG/ML IJ SOLN: 6.2500 mg | INTRAMUSCULAR | Status: DC | PRN

## 2012-09-10 MED ORDER — SODIUM CHLORIDE 0.9 % IV SOLN
INTRAVENOUS | Status: DC
  Administered 2012-09-10: 1000 mL via INTRAVENOUS
  Administered 2012-09-11: 04:00:00 via INTRAVENOUS

## 2012-09-10 MED ORDER — GABAPENTIN 300 MG PO CAPS
300.0000 mg | ORAL_CAPSULE | Freq: Two times a day (BID) | ORAL | Status: DC
  Administered 2012-09-10 – 2012-09-14 (×8): 300 mg via ORAL
  Filled 2012-09-10 (×9): qty 1

## 2012-09-10 MED ORDER — CEFAZOLIN SODIUM-DEXTROSE 2-3 GM-% IV SOLR
INTRAVENOUS | Status: AC
  Filled 2012-09-10: qty 50

## 2012-09-10 MED ORDER — NEOSTIGMINE METHYLSULFATE 1 MG/ML IJ SOLN
INTRAMUSCULAR | Status: DC | PRN
  Administered 2012-09-10: 3 mg via INTRAVENOUS

## 2012-09-10 MED ORDER — KETOROLAC TROMETHAMINE 15 MG/ML IJ SOLN
7.5000 mg | Freq: Four times a day (QID) | INTRAMUSCULAR | Status: AC
  Administered 2012-09-10 – 2012-09-11 (×3): 7.5 mg via INTRAVENOUS
  Filled 2012-09-10 (×2): qty 1

## 2012-09-10 MED ORDER — DIPHENHYDRAMINE HCL 12.5 MG/5ML PO ELIX: 12.5000 mg | ORAL_SOLUTION | Freq: Four times a day (QID) | ORAL | Status: DC | PRN

## 2012-09-10 MED ORDER — MIDAZOLAM HCL 2 MG/2ML IJ SOLN
1.0000 mg | INTRAMUSCULAR | Status: DC | PRN
  Administered 2012-09-10: 1 mg via INTRAVENOUS

## 2012-09-10 MED ORDER — MENTHOL 3 MG MT LOZG: 1.0000 | LOZENGE | OROMUCOSAL | Status: DC | PRN

## 2012-09-10 MED ORDER — ONDANSETRON HCL 4 MG/2ML IJ SOLN: 4.0000 mg | Freq: Four times a day (QID) | INTRAMUSCULAR | Status: DC | PRN

## 2012-09-10 MED ORDER — MIDAZOLAM HCL 5 MG/5ML IJ SOLN
INTRAMUSCULAR | Status: DC | PRN
  Administered 2012-09-10: 2 mg via INTRAVENOUS

## 2012-09-10 MED ORDER — ACETAMINOPHEN 325 MG PO TABS
650.0000 mg | ORAL_TABLET | Freq: Four times a day (QID) | ORAL | Status: DC | PRN
  Administered 2012-09-12 – 2012-09-13 (×3): 650 mg via ORAL
  Filled 2012-09-10 (×3): qty 2

## 2012-09-10 MED ORDER — POTASSIUM CHLORIDE CRYS ER 20 MEQ PO TBCR
40.0000 meq | EXTENDED_RELEASE_TABLET | Freq: Every day | ORAL | Status: DC
  Administered 2012-09-11 – 2012-09-14 (×4): 40 meq via ORAL
  Filled 2012-09-10 (×5): qty 2

## 2012-09-10 MED ORDER — SODIUM CHLORIDE 0.9 % IJ SOLN: 9.0000 mL | INTRAMUSCULAR | Status: DC | PRN

## 2012-09-10 MED ORDER — KETOROLAC TROMETHAMINE 15 MG/ML IJ SOLN
INTRAMUSCULAR | Status: AC
  Filled 2012-09-10: qty 1

## 2012-09-10 MED ORDER — HYDROMORPHONE HCL PF 1 MG/ML IJ SOLN
INTRAMUSCULAR | Status: DC | PRN
  Administered 2012-09-10: 1 mg via INTRAVENOUS
  Administered 2012-09-10 (×2): 0.5 mg via INTRAVENOUS

## 2012-09-10 MED ORDER — HYDROMORPHONE HCL PF 1 MG/ML IJ SOLN
INTRAMUSCULAR | Status: AC
  Filled 2012-09-10: qty 1

## 2012-09-10 MED ORDER — ACETAMINOPHEN 650 MG RE SUPP: 650.0000 mg | Freq: Four times a day (QID) | RECTAL | Status: DC | PRN

## 2012-09-10 MED ORDER — DOCUSATE SODIUM 100 MG PO CAPS
100.0000 mg | ORAL_CAPSULE | Freq: Two times a day (BID) | ORAL | Status: DC
  Administered 2012-09-10 – 2012-09-14 (×8): 100 mg via ORAL
  Filled 2012-09-10 (×7): qty 1

## 2012-09-10 MED ORDER — MORPHINE SULFATE 2 MG/ML IJ SOLN: 2.0000 mg | INTRAMUSCULAR | Status: DC | PRN

## 2012-09-10 MED ORDER — OXYCODONE HCL 5 MG/5ML PO SOLN
5.0000 mg | Freq: Once | ORAL | Status: DC | PRN
  Filled 2012-09-10: qty 5

## 2012-09-10 MED ORDER — CEFAZOLIN SODIUM 1-5 GM-% IV SOLN
INTRAVENOUS | Status: AC
  Filled 2012-09-10: qty 50

## 2012-09-10 MED ORDER — OXYCODONE HCL 5 MG PO TABS: 5.0000 mg | ORAL_TABLET | Freq: Once | ORAL | Status: DC | PRN

## 2012-09-10 MED ORDER — PROPOFOL 10 MG/ML IV BOLUS
INTRAVENOUS | Status: DC | PRN
  Administered 2012-09-10: 260 mg via INTRAVENOUS

## 2012-09-10 MED ORDER — ESCITALOPRAM OXALATE 20 MG PO TABS
20.0000 mg | ORAL_TABLET | Freq: Every day | ORAL | Status: DC
  Administered 2012-09-11 – 2012-09-14 (×4): 20 mg via ORAL
  Filled 2012-09-10 (×4): qty 1

## 2012-09-10 MED ORDER — METOCLOPRAMIDE HCL 5 MG/ML IJ SOLN: 5.0000 mg | Freq: Three times a day (TID) | INTRAMUSCULAR | Status: DC | PRN

## 2012-09-10 MED ORDER — ROPIVACAINE HCL 5 MG/ML IJ SOLN
INTRAMUSCULAR | Status: DC | PRN
  Administered 2012-09-10: 30 mL

## 2012-09-10 MED ORDER — ACETAMINOPHEN 10 MG/ML IV SOLN
INTRAVENOUS | Status: AC
  Filled 2012-09-10: qty 100

## 2012-09-10 MED ORDER — GLIPIZIDE 5 MG PO TABS
5.0000 mg | ORAL_TABLET | Freq: Two times a day (BID) | ORAL | Status: DC
  Administered 2012-09-10 – 2012-09-14 (×8): 5 mg via ORAL
  Filled 2012-09-10 (×10): qty 1

## 2012-09-10 MED ORDER — LIDOCAINE HCL (CARDIAC) 20 MG/ML IV SOLN
INTRAVENOUS | Status: DC | PRN
  Administered 2012-09-10: 50 mg via INTRAVENOUS

## 2012-09-10 MED ORDER — BUPIVACAINE HCL (PF) 0.25 % IJ SOLN
INTRAMUSCULAR | Status: AC
  Filled 2012-09-10: qty 30

## 2012-09-10 MED ORDER — ACETAMINOPHEN 10 MG/ML IV SOLN
INTRAVENOUS | Status: DC | PRN
  Administered 2012-09-10: 1000 mg via INTRAVENOUS

## 2012-09-10 MED ORDER — LOSARTAN POTASSIUM 50 MG PO TABS
100.0000 mg | ORAL_TABLET | Freq: Every day | ORAL | Status: DC
  Administered 2012-09-10 – 2012-09-14 (×5): 100 mg via ORAL
  Filled 2012-09-10 (×5): qty 2

## 2012-09-10 MED ORDER — FENTANYL CITRATE 0.05 MG/ML IJ SOLN
INTRAMUSCULAR | Status: DC | PRN
  Administered 2012-09-10 (×2): 50 ug via INTRAVENOUS
  Administered 2012-09-10: 100 ug via INTRAVENOUS
  Administered 2012-09-10 (×2): 50 ug via INTRAVENOUS
  Administered 2012-09-10: 100 ug via INTRAVENOUS
  Administered 2012-09-10: 50 ug via INTRAVENOUS

## 2012-09-10 MED ORDER — INSULIN ASPART 100 UNIT/ML ~~LOC~~ SOLN
0.0000 [IU] | Freq: Three times a day (TID) | SUBCUTANEOUS | Status: DC
  Administered 2012-09-10 – 2012-09-11 (×2): 4 [IU] via SUBCUTANEOUS
  Administered 2012-09-11 – 2012-09-12 (×2): 3 [IU] via SUBCUTANEOUS
  Administered 2012-09-12: 4 [IU] via SUBCUTANEOUS
  Administered 2012-09-12 – 2012-09-13 (×2): 3 [IU] via SUBCUTANEOUS
  Administered 2012-09-13: 100 [IU] via SUBCUTANEOUS
  Administered 2012-09-14: 3 [IU] via SUBCUTANEOUS

## 2012-09-10 MED ORDER — ONDANSETRON HCL 4 MG PO TABS: 4.0000 mg | ORAL_TABLET | Freq: Four times a day (QID) | ORAL | Status: DC | PRN

## 2012-09-10 MED ORDER — MORPHINE SULFATE (PF) 1 MG/ML IV SOLN
INTRAVENOUS | Status: AC
  Filled 2012-09-10: qty 25

## 2012-09-10 MED ORDER — ROCURONIUM BROMIDE 100 MG/10ML IV SOLN
INTRAVENOUS | Status: DC | PRN
  Administered 2012-09-10: 5 mg via INTRAVENOUS
  Administered 2012-09-10: 45 mg via INTRAVENOUS

## 2012-09-10 MED ORDER — FLUTICASONE-SALMETEROL 250-50 MCG/DOSE IN AEPB
1.0000 | INHALATION_SPRAY | Freq: Two times a day (BID) | RESPIRATORY_TRACT | Status: DC
  Administered 2012-09-10 – 2012-09-14 (×7): 1 via RESPIRATORY_TRACT
  Filled 2012-09-10: qty 14

## 2012-09-10 MED ORDER — ASPIRIN EC 325 MG PO TBEC
325.0000 mg | DELAYED_RELEASE_TABLET | Freq: Two times a day (BID) | ORAL | Status: DC
  Administered 2012-09-11 – 2012-09-14 (×6): 325 mg via ORAL
  Filled 2012-09-10 (×9): qty 1

## 2012-09-10 MED ORDER — MIDAZOLAM HCL 5 MG/ML IJ SOLN
INTRAMUSCULAR | Status: AC
  Filled 2012-09-10: qty 1

## 2012-09-10 MED ORDER — METOCLOPRAMIDE HCL 10 MG PO TABS: 5.0000 mg | ORAL_TABLET | Freq: Three times a day (TID) | ORAL | Status: DC | PRN

## 2012-09-10 MED ORDER — HYDROCHLOROTHIAZIDE 25 MG PO TABS
25.0000 mg | ORAL_TABLET | Freq: Every day | ORAL | Status: DC
  Administered 2012-09-10 – 2012-09-14 (×5): 25 mg via ORAL
  Filled 2012-09-10 (×5): qty 1

## 2012-09-10 MED ORDER — NALOXONE HCL 0.4 MG/ML IJ SOLN: 0.4000 mg | INTRAMUSCULAR | Status: DC | PRN

## 2012-09-10 MED ORDER — OXYCODONE HCL 5 MG PO TABS
5.0000 mg | ORAL_TABLET | ORAL | Status: DC | PRN
  Administered 2012-09-11 – 2012-09-14 (×11): 10 mg via ORAL
  Filled 2012-09-10 (×11): qty 2

## 2012-09-10 MED ORDER — ONDANSETRON HCL 4 MG/2ML IJ SOLN
INTRAMUSCULAR | Status: DC | PRN
  Administered 2012-09-10: 4 mg via INTRAVENOUS

## 2012-09-10 SURGICAL SUPPLY — 72 items
BAG ZIPLOCK 12X15 (MISCELLANEOUS) ×3 IMPLANT
BANDAGE CONFORM 2  STR LF (GAUZE/BANDAGES/DRESSINGS) ×3 IMPLANT
BANDAGE ELASTIC 6 VELCRO ST LF (GAUZE/BANDAGES/DRESSINGS) ×3 IMPLANT
BANDAGE ESMARK 6X9 LF (GAUZE/BANDAGES/DRESSINGS) ×2 IMPLANT
BLADE SAG 18X100X1.27 (BLADE) ×3 IMPLANT
BLADE SAW SGTL 13.0X1.19X90.0M (BLADE) ×3 IMPLANT
BLADE SURG SZ11 CARB STEEL (BLADE) ×3 IMPLANT
BNDG COHESIVE 1X5 TAN STRL LF (GAUZE/BANDAGES/DRESSINGS) ×3 IMPLANT
BNDG ESMARK 6X9 LF (GAUZE/BANDAGES/DRESSINGS) ×3
BOWL SMART MIX CTS (DISPOSABLE) ×3 IMPLANT
CEMENT HV SMART SET (Cement) ×6 IMPLANT
CLOTH BEACON ORANGE TIMEOUT ST (SAFETY) ×3 IMPLANT
CORDS BIPOLAR (ELECTRODE) ×3 IMPLANT
CUFF TOURN SGL QUICK 24 (TOURNIQUET CUFF) ×1
CUFF TOURN SGL QUICK 34 (TOURNIQUET CUFF) ×1
CUFF TRNQT CYL 24X4X40X1 (TOURNIQUET CUFF) ×2 IMPLANT
CUFF TRNQT CYL 34X4X40X1 (TOURNIQUET CUFF) ×2 IMPLANT
DRAPE EXTREMITY T 121X128X90 (DRAPE) ×3 IMPLANT
DRAPE LG THREE QUARTER DISP (DRAPES) ×3 IMPLANT
DRAPE POUCH INSTRU U-SHP 10X18 (DRAPES) ×3 IMPLANT
DRAPE SURG 17X11 SM STRL (DRAPES) ×3 IMPLANT
DRAPE U-SHAPE 47X51 STRL (DRAPES) ×3 IMPLANT
DRSG PAD ABDOMINAL 8X10 ST (GAUZE/BANDAGES/DRESSINGS) ×3 IMPLANT
DURAPREP 26ML APPLICATOR (WOUND CARE) ×6 IMPLANT
ELECT REM PT RETURN 9FT ADLT (ELECTROSURGICAL) ×3
ELECTRODE REM PT RTRN 9FT ADLT (ELECTROSURGICAL) ×2 IMPLANT
EVACUATOR 1/8 PVC DRAIN (DRAIN) ×3 IMPLANT
FACESHIELD LNG OPTICON STERILE (SAFETY) ×15 IMPLANT
GAUZE SPONGE 2X2 8PLY STRL LF (GAUZE/BANDAGES/DRESSINGS) ×2 IMPLANT
GAUZE XEROFORM 1X8 LF (GAUZE/BANDAGES/DRESSINGS) ×3 IMPLANT
GAUZE XEROFORM 5X9 LF (GAUZE/BANDAGES/DRESSINGS) ×3 IMPLANT
GLOVE BIO SURGEON STRL SZ7.5 (GLOVE) ×3 IMPLANT
GLOVE BIOGEL PI IND STRL 7.5 (GLOVE) ×2 IMPLANT
GLOVE BIOGEL PI IND STRL 8 (GLOVE) ×2 IMPLANT
GLOVE BIOGEL PI INDICATOR 7.5 (GLOVE) ×1
GLOVE BIOGEL PI INDICATOR 8 (GLOVE) ×1
GLOVE ECLIPSE 7.0 STRL STRAW (GLOVE) ×3 IMPLANT
GOWN STRL REIN XL XLG (GOWN DISPOSABLE) ×9 IMPLANT
HANDPIECE INTERPULSE COAX TIP (DISPOSABLE) ×1
IMMOBILIZER KNEE 20 (SOFTGOODS) ×3
IMMOBILIZER KNEE 20 THIGH 36 (SOFTGOODS) ×2 IMPLANT
KIT BASIN OR (CUSTOM PROCEDURE TRAY) ×3 IMPLANT
NEEDLE HYPO 22GX1.5 SAFETY (NEEDLE) ×3 IMPLANT
NS IRRIG 1000ML POUR BTL (IV SOLUTION) ×3 IMPLANT
PACK LOWER EXTREMITY WL (CUSTOM PROCEDURE TRAY) ×3 IMPLANT
PACK TOTAL JOINT (CUSTOM PROCEDURE TRAY) ×3 IMPLANT
PADDING CAST COTTON 6X4 STRL (CAST SUPPLIES) ×3 IMPLANT
POSITIONER SURGICAL ARM (MISCELLANEOUS) ×3 IMPLANT
SET HNDPC FAN SPRY TIP SCT (DISPOSABLE) ×2 IMPLANT
SET PAD KNEE POSITIONER (MISCELLANEOUS) ×3 IMPLANT
SLEEVE SURGEON STRL (DRAPES) ×3 IMPLANT
SPONGE GAUZE 2X2 STER 10/PKG (GAUZE/BANDAGES/DRESSINGS) ×1
SPONGE GAUZE 4X4 12PLY (GAUZE/BANDAGES/DRESSINGS) ×3 IMPLANT
SPONGE LAP 18X18 X RAY DECT (DISPOSABLE) ×3 IMPLANT
STAPLER VISISTAT 35W (STAPLE) ×3 IMPLANT
STRIP CLOSURE SKIN 1/2X4 (GAUZE/BANDAGES/DRESSINGS) IMPLANT
SUCTION FRAZIER 12FR DISP (SUCTIONS) ×3 IMPLANT
SUT ETHILON 3 0 PS 1 (SUTURE) ×3 IMPLANT
SUT ETHILON 4 0 PS 2 18 (SUTURE) ×3 IMPLANT
SUT VIC AB 0 CT1 27 (SUTURE) ×2
SUT VIC AB 0 CT1 27XBRD ANTBC (SUTURE) ×4 IMPLANT
SUT VIC AB 1 CT1 27 (SUTURE) ×3
SUT VIC AB 1 CT1 27XBRD ANTBC (SUTURE) ×6 IMPLANT
SUT VIC AB 2-0 CT1 27 (SUTURE) ×2
SUT VIC AB 2-0 CT1 TAPERPNT 27 (SUTURE) ×4 IMPLANT
SUT VLOC 180 0 24IN GS25 (SUTURE) ×3 IMPLANT
SYR CONTROL 10ML LL (SYRINGE) ×3 IMPLANT
TOWEL NATURAL 10PK STERILE (DISPOSABLE) ×6 IMPLANT
TOWEL OR 17X26 10 PK STRL BLUE (TOWEL DISPOSABLE) ×6 IMPLANT
TOWEL OR NON WOVEN STRL DISP B (DISPOSABLE) ×3 IMPLANT
TRAY FOLEY CATH 14FRSI W/METER (CATHETERS) ×3 IMPLANT
WATER STERILE IRR 1500ML POUR (IV SOLUTION) ×3 IMPLANT

## 2012-09-10 NOTE — Anesthesia Postprocedure Evaluation (Signed)
Anesthesia Post Note  Patient: [name]  Procedure(s) Performed: Procedure(s) (LRB): TOTAL KNEE ARTHROPLASTY (Left) RELEASE TRIGGER FINGER/A-1 PULLEY (Right)  Anesthesia type: General  Patient location: PACU  Post pain: Pain level controlled. Prior to femoral nerve block pain 10/10, post 2/10 primarily located in posterior knee.  Post assessment: Post-op Vital signs reviewed  Last Vitals: BP 113/79  Pulse 91  Temp 36.6 C (Oral)  Resp 14  SpO2 92%  LMP 07/26/2009  Post vital signs: Reviewed  Level of consciousness: sedated  Complications: No apparent anesthesia complications

## 2012-09-10 NOTE — Progress Notes (Signed)
Dr. Renold Don, anesthes. In to talk with pt to discuss doing femoral nerve block to left knee for pain control; pt agreeable

## 2012-09-10 NOTE — Anesthesia Preprocedure Evaluation (Addendum)
Anesthesia Evaluation  Patient identified by MRN, date of birth, ID band Patient awake    Reviewed: Allergy & Precautions, H&P , NPO status , Patient's Chart, lab work & pertinent test results  Airway Mallampati: II TM Distance: >3 FB Neck ROM: Full    Dental  (+) Teeth Intact and Dental Advisory Given   Pulmonary shortness of breath and with exertion, asthma , sleep apnea , pneumonia -, resolved, PE Hx of PE breath sounds clear to auscultation  Pulmonary exam normal       Cardiovascular Exercise Tolerance: Poor hypertension, Pt. on medications + CAD, + Past MI and + Cardiac Stents - CHF and - DVT + Valvular Problems/Murmurs AS Rhythm:Regular Rate:Normal  Moderate AS, preserved LVSF   Neuro/Psych  Headaches, PSYCHIATRIC DISORDERS Depression    GI/Hepatic Neg liver ROS, GERD-  Medicated,  Endo/Other  diabetes, Poorly Controlled, Type 2, Oral Hypoglycemic AgentsMorbid obesitySuper morbid obesity  Renal/GU Renal Insufficiency and CRFRenal disease     Musculoskeletal  (+) Arthritis -, Osteoarthritis,    Abdominal (+) + obese,   Peds  Hematology  (+) Blood dyscrasia, anemia ,   Anesthesia Other Findings   Reproductive/Obstetrics                        Anesthesia Physical Anesthesia Plan  ASA: IV  Anesthesia Plan: General   Post-op Pain Management:    Induction: Intravenous  Airway Management Planned: Oral ETT  Additional Equipment:   Intra-op Plan:   Post-operative Plan: Extubation in OR  Informed Consent: I have reviewed the patients History and Physical, chart, labs and discussed the procedure including the risks, benefits and alternatives for the proposed anesthesia with the patient or authorized representative who has indicated his/her understanding and acceptance.   Dental advisory given  Plan Discussed with: CRNA and Surgeon  Anesthesia Plan Comments:         Anesthesia  Quick Evaluation

## 2012-09-10 NOTE — Progress Notes (Signed)
Pre med given and femoral block to left knee done by Dr. Renold Don; pt tol well

## 2012-09-10 NOTE — Anesthesia Procedure Notes (Signed)
Anesthesia Regional Block:  Femoral nerve block  Pre-Anesthetic Checklist: ,, timeout performed, Correct Patient, Correct Site, Correct Laterality, Correct Procedure, Correct Position, site marked, Risks and benefits discussed,  Surgical consent,  Pre-op evaluation,  At surgeon's request and post-op pain management  Laterality: Left  Prep: chloraprep       Needles:   Needle Type: Stimiplex     Needle Length:cm 9 cm Needle Gauge: 20 and 20 G    Additional Needles:  Procedures: ultrasound guided and nerve stimulator  Motor weakness within 10 minutes. Femoral nerve block  Nerve Stimulator or Paresthesia:  Response: Motor, quadracept, 0.5 mA,   Additional Responses:   Narrative:  Start time: 09/10/2012 2:15 PM End time: 09/10/2012 2:30 PM Injection made incrementally with aspirations every 5 mL.  Performed by: Personally  Anesthesiologist: Demeka Sutter  Additional Notes: Difficult visualization of femoral nerve on ultrasound  Femoral nerve block

## 2012-09-10 NOTE — Brief Op Note (Signed)
09/10/2012  12:34 PM  PATIENT:  [name]  50 y.o. female  PRE-OPERATIVE DIAGNOSIS:  Severe left knee osteoarthritis, right ring finger trigger finger  POST-OPERATIVE DIAGNOSIS:  Severe left knee osteoarthritis, right ring finger trigger finger  PROCEDURE:  Procedure(s) (LRB) with comments: TOTAL KNEE ARTHROPLASTY (Left) RELEASE TRIGGER FINGER/A-1 PULLEY (Right) - Right Ring Finger  SURGEON:  Surgeon(s) and Role:    * [name], MD - Primary  PHYSICIAN ASSISTANT:   ASSISTANTS: Maud Deed, PA-C   ANESTHESIA:   general  EBL:  Total I/O In: 2000 [I.V.:2000] Out: 225 [Urine:125; Blood:100]  BLOOD ADMINISTERED:none  DRAINS: none   LOCAL MEDICATIONS USED:  NONE  SPECIMEN:  No Specimen  DISPOSITION OF SPECIMEN:  N/A  COUNTS:  YES  TOURNIQUET:   Total Tourniquet Time Documented: Forearm (Right) - 9 minutes Thigh (Left) - 70 minutes  DICTATION: .Other Dictation: Dictation Number [id number]  PLAN OF CARE: Admit to inpatient   PATIENT DISPOSITION:  PACU - hemodynamically stable.   Delay start of Pharmacological VTE agent (>24hrs) due to surgical blood loss or risk of bleeding: no

## 2012-09-10 NOTE — H&P (Signed)
[name] is an 51 y.o. female.   Chief Complaint:   1) severe left knee pain with known OA; 2) right ring finger trigger finger HPI:   51 yo female well-known to me.  She has well-documented bone-on-bone wear of her left knee and triggering of her right ring finger.  She wishes to proceed with a left total knee replacement and a right ring finger A1 pulley release.  She has failed conservative treatment.  The risks are nerve and vessel injury, blood loss, DVT, infection, etc.  The goals are improved function and mobility and decreased pain.  Past Medical History  Diagnosis Date  . Asthma   . Hypertension   . Hyperlipidemia   . GERD (gastroesophageal reflux disease)   . Depression   . Aortic stenosis, moderate     per 11/2011 echo  . Blood transfusion     2013[facility]  . Shortness of breath   . Headache     h/o migraines  . Pulmonary embolism 02/08/12    S/P RT TOTAL KNEE ON 02/03/12--ON 02/08/12--DEVELOPED ACUTE SOB AND CHEST PAIN--AND DIAGNOSED WITH  PULMONARY EMBOLUS AND PNEUMONIA  . Restless leg syndrome   . Myocardial infarction     PT THINKS SHE WAS DX WITH MI AT THE TIME OF HEART STENTING  . CAD (coronary artery disease)     Cath 2010 with DES x 1 RCA-- PT'S CARDIOLOGIST IS DR. MCALHANY  . Uterine fibroid     NO PROBLEMS AT PRESENT FROM THE FIBROIDS-STATES SHE IS POST MENOPAUSAL-LAST MENSES 2010 EXCEPT FOR EPISODE THIS YR OF BLEEDING RELATED TO FIBROIDS.  Marland Kitchen Heart murmur   . Pneumonia 02/08/12    HOSPITALIZED AT [facility] WITH PNEUMONIA AND WITH PULMONARY EMBOLUS  . Diabetes mellitus DIAGNOSED IN2010    ORAL MEDS  . Chronic kidney disease     PT HAS KIDNEY STONES PER XRAY--NO PROBLEMS AT PRESENT TIME FROM THE STONES; IN THE PAST SHE PASSED A STONE  . Arthritis     PAIN AND SEVERE OA LEFT KNEE ; S/P RIGHT TKA ON 02/03/12; HAS LOWER BACK PAIN-UNABLE TO STAND MORE THAN 10 MIN; ARTHRITIS "ALL OVER"  . Sleep apnea 09/03/12    STOP BANG SCORE OF 7  . Tingling     RIGHT ARM AND HAND    . Neck pain   . Weakness     BOTH HANDS - S/P BILATERAL CARPAL TUNNEL RELEASE--BUT STILL HAS WEAKNESS--OFTEN DROPS THINGS    Past Surgical History  Procedure Date  . Tubal ligation   . Carpal tunnel release     Bilateral  . Cholecystectomy   . Hernia repair   . Cardiac catheterization   . Knee arthroplasty 02/03/2012    Procedure: COMPUTER ASSISTED TOTAL KNEE ARTHROPLASTY;  Surgeon: [name], MD;  Location: [facility] OR;  Service: Orthopedics;  Laterality: Right;  Right total knee arthroplasty  . Coronary angioplasty     2010 has stent in place    Family History  Problem Relation Age of Onset  . Cancer Mother     Breast  . COPD Father   . Cancer Brother     Sinus   Social History:  reports that she has quit smoking. Her smoking use included Cigarettes. She has a 45 pack-year smoking history. She does not have any smokeless tobacco history on file. She reports that she does not drink alcohol or use illicit drugs.  Allergies: No Known Allergies  No prescriptions prior to admission  No results found for this or any previous visit (from the past 48 hour(s)). No results found.  Review of Systems  All other systems reviewed and are negative.    Last menstrual period 07/26/2009. Physical Exam  Constitutional: She is oriented to person, place, and time. She appears well-developed and well-nourished.  HENT:  Head: Normocephalic and atraumatic.  Eyes: EOM are normal. Pupils are equal, round, and reactive to light.  Neck: Normal range of motion. Neck supple.  Cardiovascular: Normal rate and regular rhythm.   Respiratory: Effort normal and breath sounds normal.  GI: Soft. Bowel sounds are normal.  Musculoskeletal:       Left knee: She exhibits decreased range of motion, effusion and abnormal patellar mobility. tenderness found. Medial joint line and lateral joint line tenderness noted.       Right hand: She exhibits tenderness.       Hands: Neurological: She is  alert and oriented to person, place, and time.  Skin: Skin is warm and dry.  Psychiatric: She has a normal mood and affect.     Assessment/Plan Left knee osteoarthritis and right ring finger trigger finger 1)  To the OR today for a left total knee replacement and a right ring finger A1 pulley release.  Derian Pfost Y 09/10/2012, 7:28 AM

## 2012-09-10 NOTE — Transfer of Care (Signed)
Immediate Anesthesia Transfer of Care Note  Patient: [name]  Procedure(s) Performed: Procedure(s) (LRB) with comments: TOTAL KNEE ARTHROPLASTY (Left) RELEASE TRIGGER FINGER/A-1 PULLEY (Right) - Right Ring Finger  Patient Location: PACU  Anesthesia Type: General  Level of Consciousness: oriented, sedated and patient cooperative  Airway & Oxygen Therapy: Patient Spontanous Breathing and Patient connected to face mask oxygen  Post-op Assessment: Report given to PACU RN, Post -op Vital signs reviewed and stable and Patient moving all extremities  Post vital signs: Reviewed and stable  Complications: No apparent anesthesia complications

## 2012-09-10 NOTE — Progress Notes (Signed)
Anesthesia note  Pt seen postop. Complaining of 10/10 pain. Discussed postoperative femoral nerve block. The pt was AA)x4, expressed understanding of the procedure and elected to have the nerve block done. She received 1 mg midazolam and was on standard monitors. Infiltration of 30ml 0.5% ropivacaine at 5ml intervals injection. VSS.

## 2012-09-11 LAB — CBC
Hemoglobin: 10.3 g/dL — ABNORMAL LOW (ref 12.0–15.0)
MCH: 27.5 pg (ref 26.0–34.0)
MCV: 83.2 fL (ref 78.0–100.0)
RBC: 3.74 MIL/uL — ABNORMAL LOW (ref 3.87–5.11)

## 2012-09-11 LAB — BASIC METABOLIC PANEL
CO2: 28 mEq/L (ref 19–32)
Calcium: 8.5 mg/dL (ref 8.4–10.5)
Creatinine, Ser: 0.92 mg/dL (ref 0.50–1.10)
Glucose, Bld: 78 mg/dL (ref 70–99)

## 2012-09-11 LAB — GLUCOSE, CAPILLARY
Glucose-Capillary: 144 mg/dL — ABNORMAL HIGH (ref 70–99)
Glucose-Capillary: 136 mg/dL — ABNORMAL HIGH (ref 70–99)

## 2012-09-11 NOTE — Plan of Care (Signed)
Problem: Consults Goal: Diagnosis- Total Joint Replacement Outcome: Completed/Met Date Met:  09/11/12 Primary Total Knee LEFT

## 2012-09-11 NOTE — Op Note (Signed)
[name], [name] NO.:  [id number]  MEDICAL RECORD NO.:  [id number]  LOCATION:  1604                         FACILITY:  [facility]  PHYSICIAN:  Vanita Panda. Magnus Ivan, M.D.DATE OF BIRTH:  07-17-1961  DATE OF PROCEDURE:  09/10/2012 DATE OF DISCHARGE:                              OPERATIVE REPORT   PREOPERATIVE DIAGNOSES: 1. Left knee severe osteoarthritis and degenerative joint disease. 2. Right ring finger, trigger finger.  POSTOPERATIVE DIAGNOSES: 1. Left knee severe osteoarthritis and degenerative joint disease. 2. Right ring finger, trigger finger.  PROCEDURE: 1. Right ring finger A1 pulley release. 2. Right total knee arthroplasty.  IMPLANTS:  DePuy rotating platform, PFC Sigma Knee with size 2.5 femur, MTP tibial revision tray due to morbid obesity, size 2; 10 mm polyethylene insert; 32 mm patellar button.  SURGEON:  Vanita Panda. Magnus Ivan, MD.  ASSISTANT:  Maud Deed, PA-C, who was needed and present during the entire case and the assistance was crucial until the end of the case essentially due to the morbid obesity.  ANESTHESIA:  General.  TOURNIQUET TIME:  Less than 10 minutes on the right hand and  70 minutes on the left knee.  BLOOD LOSS:  Less than 100 mL.  COMPLICATIONS:  None.  INDICATIONS:  [name] is a 51 year old obese female with diabetes and debilitating arthritis involving both her knees.  She underwent a successful right total knee arthroplasty last year.  Now, is presenting for left total knee arthroplasty, as she also has been having active triggering over A1 pulley on her right hand fourth finger.  We have tried injections on this and she failed these, so she would like to proceed with an A1 pulley release since it will all be under one anesthesia.  Risks and benefits of surgery were explained to her in detail.  She did wish to proceed with surgery.  PROCEDURE DESCRIPTION:  After informed consent was obtained,  appropriate right ring finger and left knee were marked.  She was brought to the operating room, placed supine on the operating table with the right arm on the arm table.  General anesthesia was then obtained.  A Foley catheter was placed.  We decided to address the right hand first.  The right hand was prepped and draped with DuraPrep and sterile drapes. Time-out was called to identify the correct patient, correct right hand. With a forearm tourniquet already in place, the tourniquet was inflated to 250 mmHg of pressure.  I made incision directly over the A1 pulley of the right ring finger.  I dissected down to the soft tissues and did fine triggering with thickened A1 pulley.  I did release this in entirety and watched the flexor tendons glide smoothly.  I then irrigated the soft tissues and closed the skin with interrupted 3-0 nylon suture.  I infiltrated with 0.25% plain Sensorcaine and placed well-padded sterile dressing.  Tourniquet was let down and fingers pinkened nicely.  After placement of a tourniquet around the thigh, we prepped the left knee from the thigh down to the ankle with DuraPrep and sterile dressings, and a sterile stockinette was placed.  Time-out was called to identify correct patient and correct left knee.  We then used  an Esmarch to wrap out the leg and tourniquet was inflated to 350 mm of pressure. A midline incision was then made and we dissected down to the knee joint.  I performed a medial parapatellar arthrotomy and a large effusion was encountered.  We then subluxed the patella and was able to flex the knee up.  We removed remnants of the PCL, ACL, and meniscus. The knee was full of osteophytes.  We used a rongeur to clean those around the patella.  We then referenced off of the tibia extramedullary and I was able to make our tibial cut, it was just a slight slope. Knowing the fact that we wanted to place a revision tibial tray due to her morbid obesity,  we took 10 mm off the high side.  Next, we turned our attention to the femur.  After choosing a 2.5 mm trial femur, we made our distal femoral cut at 10 mm with reference to 5 degrees of external rotation.  Once that was done, we placed our 4-in-1 cutting block based on the size 2.5 femur, and we made our anterior and posterior cuts as well as our chamfer cuts.  This was after setting rotation.  Of note, we used the intramedullary guide for the femur.  The box cut was then made finally on the femur.  We then turned attention back to the tibia, and we sized for a size 2 femur.  We did our keel and punch for preparing the tibia.  We then placed the trial tibia to 2 size followed by the trial 2.5 femur and 10 mm polyethylene insert.  I was pleased with her motion and having her balanced.  We cut the patella and drilled lugs for a 32 patellar button.  We then removed all trial components and copiously irrigated the knee with normal saline solution. We then cemented the real size 2 tibial revision tray followed by the real size 2.5 femur.  We placed a real 10 mm polyethylene insert followed by the 32 mm patellar button.  Once the cement had dried, we let the tourniquet down and hemostasis was obtained with electrocautery. We placed medium Hemovac drain in the arthrotomy, and copiously irrigated the tissues again with normal saline solution.  We then closed the arthrotomy with interrupted #1 Vicryl followed by running 0 V-Loc suture.  We closed the subcutaneous tissue with 2-0 Vicryl and staples on the skin.  Well-padded sterile dressing was applied.  She was awakened, extubated, and taken to recovery room in stable condition. All final counts were correct.  There were no complications noted.     Vanita Panda. Magnus Ivan, M.D.     CYB/MEDQ  D:  09/10/2012  T:  09/11/2012  Job:  [id number]

## 2012-09-11 NOTE — Evaluation (Signed)
Physical Therapy Evaluation Patient Details Name: [name] MRN: [id number] DOB: 1961/10/04 Today's Date: 09/11/2012 Time: 1324-4010 PT Time Calculation (min): 26 min  PT Assessment / Plan / Recommendation Clinical Impression  51 y.o. female POD #1 for L TKA. Min to mod assist for OOB to chair with RW.  Good progress expected. Pt plans to DC home with HHPT and assist from family. Pt would benefit from acute PT to maximize safety and independence with mobility.     PT Assessment  Patient needs continued PT services    Follow Up Recommendations  Home health PT;Supervision for mobility/OOB    Barriers to Discharge None      Equipment Recommendations  None recommended by PT    Recommendations for Other Services OT consult   Frequency 7X/week    Precautions / Restrictions Precautions Precautions: Knee Required Braces or Orthoses: Knee Immobilizer - Left Knee Immobilizer - Left:  (not specified in orders) Restrictions Weight Bearing Restrictions: No Other Position/Activity Restrictions: WBAT   Pertinent Vitals/Pain 7/10 L knee pain after walking Ice applied, PCA used prior to walking, RN notified      Mobility  Bed Mobility Bed Mobility: Supine to Sit Supine to Sit: 3: Mod assist Details for Bed Mobility Assistance: mod assist to elevate trunk and support LLE, pt 60% Transfers Transfers: Sit to Stand;Stand to Sit Sit to Stand: 4: Min assist;From bed;With upper extremity assist Stand to Sit: 4: Min guard;To chair/3-in-1;With upper extremity assist;With armrests Details for Transfer Assistance: Min assist to elevate trunk Ambulation/Gait Ambulation/Gait Assistance: 4: Min guard Ambulation Distance (Feet): 5 Feet Assistive device: Rolling walker Gait Pattern: Step-to pattern;Decreased step length - left General Gait Details: VCs for sequencing    Exercises Total Joint Exercises Ankle Circles/Pumps: AROM;Both;15 reps;Supine Quad Sets: AROM;Both;10  reps;Supine Heel Slides: AAROM;Left;10 reps;Supine   PT Diagnosis: Difficulty walking;Acute pain  PT Problem List: Decreased strength;Decreased range of motion;Decreased activity tolerance;Decreased mobility;Pain;Obesity PT Treatment Interventions: Gait training;Stair training;Therapeutic activities;Therapeutic exercise;Patient/family education   PT Goals Acute Rehab PT Goals PT Goal Formulation: With patient Time For Goal Achievement: 09/15/12 Potential to Achieve Goals: Good Pt will go Supine/Side to Sit: with modified independence;with HOB 0 degrees PT Goal: Supine/Side to Sit - Progress: Goal set today Pt will go Sit to Stand: with modified independence;with upper extremity assist PT Goal: Sit to Stand - Progress: Goal set today Pt will Ambulate: 51 - 150 feet;with modified independence;with rolling walker PT Goal: Ambulate - Progress: Goal set today Pt will Go Up / Down Stairs: 1-2 stairs;with min assist PT Goal: Up/Down Stairs - Progress: Goal set today Pt will Perform Home Exercise Program: Independently PT Goal: Perform Home Exercise Program - Progress: Goal set today  Visit Information  Last PT Received On: 09/11/12 Assistance Needed: +1    Subjective Data  Subjective: My knee hurts.    Prior Functioning  Home Living Lives With: Spouse;Son Available Help at Discharge: Family Home Access: Stairs to enter Entrance Stairs-Number of Steps: 2 Home Layout: One level Home Adaptive Equipment: Bedside commode/3-in-1;Walker - rolling;Shower chair without back Prior Function Level of Independence: Independent Able to Take Stairs?: Yes Communication Communication: No difficulties    Cognition  Overall Cognitive Status: Appears within functional limits for tasks assessed/performed Arousal/Alertness: Awake/alert Orientation Level: Appears intact for tasks assessed Behavior During Session: [facility] for tasks performed    Extremity/Trunk Assessment Right Upper Extremity  Assessment RUE ROM/Strength/Tone: Deficits (R hand trigger finger release POD #1) RUE ROM/Strength/Tone Deficits: shoulder/elbow WFL Left Upper Extremity Assessment  LUE ROM/Strength/Tone: WFL for tasks assessed Right Lower Extremity Assessment RLE ROM/Strength/Tone: Within functional levels RLE Sensation: WFL - Light Touch RLE Coordination: WFL - gross/fine motor Left Lower Extremity Assessment LLE ROM/Strength/Tone: Deficits;Due to pain LLE ROM/Strength/Tone Deficits: knee flexion approx. 30* AAROM; weak but palpable quad contraction; SLR 2/5 LLE Sensation: WFL - Light Touch LLE Coordination: WFL - gross/fine motor Trunk Assessment Trunk Assessment: Normal   Balance    End of Session PT - End of Session Equipment Utilized During Treatment: Left knee immobilizer Activity Tolerance: Patient limited by pain;Patient limited by fatigue Patient left: in chair;with call bell/phone within reach Nurse Communication: Mobility status CPM Left Knee CPM Left Knee: Off  GP     Ralene Bathe Kistler 09/11/2012, 11:12 AM  [phone]

## 2012-09-11 NOTE — Progress Notes (Signed)
Physical Therapy Treatment Patient Details Name: [name] MRN: [id number] DOB: 16-Apr-1961 Today's Date: 09/11/2012 Time: 4098-1191 PT Time Calculation (min): 21 min  PT Assessment / Plan / Recommendation Comments on Treatment Session  Pt requested assistance with return to bed from recliner. She ambulated 5' with RW.  Ther ex performed. Pt reported 9/10 L knee pain with use of PCA, she declined to attempt ambulating further. Min A for transfers and sit to supine. HHPT recommended.     Follow Up Recommendations  Home health PT;Supervision for mobility/OOB    Barriers to Discharge None      Equipment Recommendations  None recommended by PT    Recommendations for Other Services OT consult  Frequency 7X/week   Plan Discharge plan remains appropriate;Frequency remains appropriate    Precautions / Restrictions Precautions Precautions: Knee Required Braces or Orthoses: Knee Immobilizer - Left Knee Immobilizer - Left:  (not specified in orders) Restrictions Weight Bearing Restrictions: No Other Position/Activity Restrictions: WBAT   Pertinent Vitals/Pain *9/10 L knee with activity PCA used, ice applied, RN notified**    Mobility  Bed Mobility Bed Mobility: Sit to Supine Supine to Sit: 3: Mod assist Sit to Supine: 4: Min assist Details for Bed Mobility Assistance: min A for BLEs; pt 75% Transfers Transfers: Sit to Stand;Stand to Sit Sit to Stand: 4: Min assist;With upper extremity assist;From chair/3-in-1 Stand to Sit: 4: Min guard;With upper extremity assist;To bed Details for Transfer Assistance: Min assist to elevate trunk Ambulation/Gait Ambulation/Gait Assistance: 4: Min guard Ambulation Distance (Feet): 5 Feet Assistive device: Rolling walker Gait Pattern: Step-to pattern;Decreased step length - left General Gait Details: VCs for sequencing, pt ambulated chair to bed, distance limited by pain    Exercises Total Joint Exercises Ankle Circles/Pumps:  AROM;Both;Supine;10 reps Quad Sets: AROM;Both;10 reps;Supine Short Arc Quad: AAROM;Left;10 reps;Supine Heel Slides: AAROM;Left;10 reps;Supine Hip ABduction/ADduction: AAROM;Left;10 reps;Supine Straight Leg Raises: AAROM;Left;10 reps;Supine   PT Diagnosis: Difficulty walking;Acute pain  PT Problem List: Decreased strength;Decreased range of motion;Decreased activity tolerance;Decreased mobility;Pain;Obesity PT Treatment Interventions: Gait training;Stair training;Therapeutic activities;Therapeutic exercise;Patient/family education   PT Goals Acute Rehab PT Goals PT Goal Formulation: With patient Time For Goal Achievement: 09/15/12 Potential to Achieve Goals: Good Pt will go Supine/Side to Sit: with modified independence;with HOB 0 degrees PT Goal: Supine/Side to Sit - Progress: Goal set today Pt will go Sit to Stand: with modified independence;with upper extremity assist PT Goal: Sit to Stand - Progress: Goal set today Pt will Ambulate: 51 - 150 feet;with modified independence;with rolling walker PT Goal: Ambulate - Progress: Goal set today Pt will Go Up / Down Stairs: 1-2 stairs;with min assist PT Goal: Up/Down Stairs - Progress: Goal set today Pt will Perform Home Exercise Program: Independently PT Goal: Perform Home Exercise Program - Progress: Goal set today  Visit Information  Last PT Received On: 09/11/12 Assistance Needed: +1    Subjective Data  Subjective: I'm hurting. The morphine just makes me sleep.   Cognition  Overall Cognitive Status: Appears within functional limits for tasks assessed/performed Arousal/Alertness: Awake/alert Orientation Level: Appears intact for tasks assessed Behavior During Session: [facility] for tasks performed    Balance     End of Session PT - End of Session Equipment Utilized During Treatment: Left knee immobilizer Activity Tolerance: Patient limited by pain;Patient limited by fatigue Patient left: with call bell/phone within reach;in  bed Nurse Communication: Mobility status CPM Left Knee CPM Left Knee: Off   GP     Ralene Bathe Kistler 09/11/2012, 1:18 PM  [phone]  

## 2012-09-11 NOTE — Evaluation (Signed)
Occupational Therapy Evaluation Patient Details Name: [name] MRN: [id number] DOB: 1961-03-04 Today's Date: 09/11/2012 Time: 2841-3244 OT Time Calculation (min): 36 min  OT Assessment / Plan / Recommendation Clinical Impression  Pt. would benefit from OT on acute to reinforce proper use of AE and DME for self care independence and safety.     OT Assessment  Patient needs continued OT Services    Follow Up Recommendations       Barriers to Discharge      Equipment Recommendations  None recommended by PT    Recommendations for Other Services    Frequency  Min 2X/week    Precautions / Restrictions Precautions Precautions: Knee Required Braces or Orthoses: Knee Immobilizer - Left Knee Immobilizer - Left:  (not specified in orders) Restrictions Weight Bearing Restrictions: No Other Position/Activity Restrictions: WBAT       ADL  Eating/Feeding: Independent Where Assessed - Eating/Feeding: Chair Grooming: Simulated;Set up;Wash/dry hands;Wash/dry face;Teeth care;Brushing hair Where Assessed - Grooming: Supported sitting Upper Body Bathing: Set up Where Assessed - Upper Body Bathing: Supported sitting Lower Body Bathing: Simulated;Minimal assistance Where Assessed - Lower Body Bathing: Supported sitting Upper Body Dressing: Set up Where Assessed - Upper Body Dressing:  (chair) Lower Body Dressing: Moderate assistance Where Assessed - Lower Body Dressing:  ( chair) ADL Comments: Pt. states that she would like OT in hospital to practice using AE and DME.    OT Diagnosis: Generalized weakness  OT Problem List: Decreased activity tolerance;Decreased knowledge of use of DME or AE;Obesity;Pain OT Treatment Interventions: Self-care/ADL training;Therapeutic exercise;DME and/or AE instruction;Therapeutic activities   OT Goals Acute Rehab OT Goals OT Goal Formulation: With patient Time For Goal Achievement: 09/25/12 Potential to Achieve Goals: Good ADL Goals Pt  Will Perform Upper Body Dressing: Independently Pt Will Perform Lower Body Dressing: with min assist;Sitting, chair;Sitting, bed;with adaptive equipment ADL Goal: Lower Body Dressing - Progress: Goal set today Pt Will Transfer to Toilet: with supervision;with DME;Comfort height toilet;Extra wide 3-in-1  Visit Information  Assistance Needed: +1    Subjective Data  Patient Stated Goal: go home   Prior Functioning  Vision/Perception  Home Living Lives With: Spouse;Son Available Help at Discharge: Family Home Access: Stairs to enter Secretary/administrator of Steps: 2 Home Layout: One level Bathroom Shower/Tub: Tub/shower unit;Curtain (tub bench) Bathroom Toilet: Handicapped height Home Adaptive Equipment: Bedside commode/3-in-1;Walker - rolling;Shower chair without back Prior Function Level of Independence: Independent Able to Take Stairs?: Yes Communication Communication: No difficulties      Cognition  Overall Cognitive Status: Appears within functional limits for tasks assessed/performed Arousal/Alertness: Awake/alert Orientation Level: Appears intact for tasks assessed Behavior During Session: [facility] for tasks performed    Extremity/Trunk Assessment Right Upper Extremity Assessment RUE ROM/Strength/Tone: Deficits (R hand trigger finger release POD #1) RUE ROM/Strength/Tone Deficits: shoulder/elbow WFL RUE Coordination: WFL - gross/fine motor;WFL - gross motor Left Upper Extremity Assessment LUE ROM/Strength/Tone: WFL for tasks assessed LUE Coordination: WFL - gross/fine motor;WFL - gross motor Right Lower Extremity Assessment RLE ROM/Strength/Tone: Within functional levels RLE Sensation: WFL - Light Touch RLE Coordination: WFL - gross/fine motor Left Lower Extremity Assessment LLE ROM/Strength/Tone: Deficits;Due to pain LLE ROM/Strength/Tone Deficits: knee flexion approx. 30* AAROM; weak but palpable quad contraction; SLR 2/5 LLE Sensation: WFL - Light Touch LLE  Coordination: WFL - gross/fine motor Trunk Assessment Trunk Assessment: Normal   Mobility  Shoulder Instructions  Bed Mobility Bed Mobility: Supine to Sit Supine to Sit: 3: Mod assist Details for Bed Mobility Assistance: mod assist to  elevate trunk and support LLE, pt 60% Transfers Sit to Stand: 4: Min assist;From bed;With upper extremity assist Stand to Sit: 4: Min guard;To chair/3-in-1;With upper extremity assist;With armrests Details for Transfer Assistance: Min assist to elevate trunk       Exercise Total Joint Exercises Ankle Circles/Pumps: AROM;Both;15 reps;Supine Quad Sets: AROM;Both;10 reps;Supine Heel Slides: AAROM;Left;10 reps;Supine   Balance     End of Session OT - End of Session Activity Tolerance: Patient tolerated treatment well Patient left: in chair;with call bell/phone within reach Nurse Communication:  (nurse OK'ed therapy) CPM Left Knee CPM Left Knee: Off  GO     Lawyer Washabaugh 09/11/2012, 12:23 PM

## 2012-09-11 NOTE — Progress Notes (Signed)
Subjective: Pt stable - pain controlled   Objective: Vital signs in last 24 hours: Temp:  [97.4 F (36.3 C)-99.1 F (37.3 C)] 98.9 F (37.2 C) (09/14 0619) Pulse Rate:  [83-105] 83  (09/14 0619) Resp:  [8-16] 16  (09/14 0731) BP: (100-191)/(63-89) 143/75 mmHg (09/14 0619) SpO2:  [89 %-100 %] 100 % (09/14 0731) Weight:  [161.223 kg (340 lb)] 154.223 kg (340 lb) (09/13 1545)  Intake/Output from previous day: 09/13 0701 - 09/14 0700 In: 4921.3 [P.O.:840; I.V.:4081.3] Out: 1885 [Urine:1735; Blood:150] Intake/Output this shift:    Exam:  Neurovascular intact Sensation intact distally Intact pulses distally Dorsiflexion/Plantar flexion intact  Labs:  Basename 09/11/12 0437  HGB 10.3*    Basename 09/11/12 0437  WBC 10.7*  RBC 3.74*  HCT 31.1*  PLT 225    Basename 09/11/12 0437  NA 135  K 4.4  CL 97  CO2 28  BUN 17  CREATININE 0.92  GLUCOSE 78  CALCIUM 8.5   No results found for this basename: LABPT:2,INR:2 in the last 72 hours  Assessment/Plan: Pt stable - dc pca am - mobilize with PT - labs ok   Elycia Woodside SCOTT 09/11/2012, 8:05 AM

## 2012-09-12 LAB — GLUCOSE, CAPILLARY: Glucose-Capillary: 142 mg/dL — ABNORMAL HIGH (ref 70–99)

## 2012-09-12 LAB — CBC
HCT: 27.3 % — ABNORMAL LOW (ref 36.0–46.0)
Hemoglobin: 9.2 g/dL — ABNORMAL LOW (ref 12.0–15.0)
RBC: 3.26 MIL/uL — ABNORMAL LOW (ref 3.87–5.11)
WBC: 11.1 10*3/uL — ABNORMAL HIGH (ref 4.0–10.5)

## 2012-09-12 NOTE — Progress Notes (Signed)
Physical Therapy Treatment Patient Details Name: [name] MRN: [id number] DOB: 03/20/1961 Today's Date: 09/12/2012 Time: 4098-1191 PT Time Calculation (min): 24 min  PT Assessment / Plan / Recommendation Comments on Treatment Session  Pt progressing with mobility, she ambulated 60' (10'+ 10'+ 40") withmin/guard assist. Pain/fatigue limit gait distance. Expect will be ready to DC home tomorrow.    Follow Up Recommendations  Home health PT    Barriers to Discharge        Equipment Recommendations  None recommended by PT    Recommendations for Other Services    Frequency 7X/week   Plan Discharge plan remains appropriate;Frequency remains appropriate    Precautions / Restrictions Precautions Precautions: Knee Required Braces or Orthoses: Knee Immobilizer - Left Restrictions Other Position/Activity Restrictions: WBAT   Pertinent Vitals/Pain *5/10 L knee premedicated**    Mobility  Bed Mobility Bed Mobility: Supine to Sit Supine to Sit: 4: Min assist;With rails Sit to Supine: 4: Min assist;With rail;HOB flat Details for Bed Mobility Assistance: min A for LLE Transfers Transfers: Sit to Stand;Stand to Sit Sit to Stand: 4: Min guard;From bed Stand to Sit: 4: Min guard;To chair/3-in-1 Details for Transfer Assistance: VCs hand placement Ambulation/Gait Ambulation/Gait Assistance: 4: Min guard Ambulation Distance (Feet): 60 Feet (10' + 10' + 40') Assistive device: Rolling walker Gait Pattern: Step-to pattern    Exercises Total Joint Exercises Ankle Circles/Pumps: AROM;Both;Supine;10 reps Quad Sets: AROM;Both;10 reps;Supine Towel Squeeze: AROM;Both;10 reps;Supine Short Arc Quad: AAROM;Left;10 reps;Supine Heel Slides: AAROM;Left;10 reps;Supine   PT Diagnosis:    PT Problem List:   PT Treatment Interventions:     PT Goals Acute Rehab PT Goals PT Goal Formulation: With patient Time For Goal Achievement: 09/15/12 Potential to Achieve Goals: Good Pt will go  Supine/Side to Sit: with modified independence;with HOB 0 degrees PT Goal: Supine/Side to Sit - Progress: Progressing toward goal Pt will go Sit to Stand: with modified independence;with upper extremity assist PT Goal: Sit to Stand - Progress: Progressing toward goal Pt will Ambulate: 51 - 150 feet;with modified independence;with rolling walker PT Goal: Ambulate - Progress: Progressing toward goal Pt will Go Up / Down Stairs: 1-2 stairs;with min assist Pt will Perform Home Exercise Program: Independently PT Goal: Perform Home Exercise Program - Progress: Progressing toward goal  Visit Information  Last PT Received On: 09/12/12 Assistance Needed: +1    Subjective Data  Subjective: I need to use the bathroom.  Patient Stated Goal: go home   Cognition  Overall Cognitive Status: Appears within functional limits for tasks assessed/performed Arousal/Alertness: Awake/alert Orientation Level: Appears intact for tasks assessed Behavior During Session: [facility] for tasks performed    Balance     End of Session PT - End of Session Equipment Utilized During Treatment: Gait belt;Left knee immobilizer Activity Tolerance: Patient limited by fatigue Patient left: in bed;with call bell/phone within reach;with family/visitor present Nurse Communication: Mobility status   GP     Ralene Bathe Kistler 09/12/2012, 2:02 PM [phone]

## 2012-09-12 NOTE — Progress Notes (Signed)
Subjective: Pt stable - pain controlled   Objective: Vital signs in last 24 hours: Temp:  [97.4 F (36.3 C)-99.4 F (37.4 C)] 97.4 F (36.3 C) (09/15 0628) Pulse Rate:  [82-102] 82  (09/15 0628) Resp:  [14-18] 18  (09/15 0628) BP: (104-127)/(53-72) 104/67 mmHg (09/15 0628) SpO2:  [93 %-98 %] 97 % (09/15 0628)  Intake/Output from previous day: 09/14 0701 - 09/15 0700 In: 994 [P.O.:720; I.V.:274] Out: 5100 [Urine:5100] Intake/Output this shift:    Exam:  Dorsiflexion/Plantar flexion intact  Labs:  [facility] 09/12/12 0409 09/11/12 0437  HGB 9.2* 10.3*    Basename 09/12/12 0409 09/11/12 0437  WBC 11.1* 10.7*  RBC 3.26* 3.74*  HCT 27.3* 31.1*  PLT 197 225    Basename 09/11/12 0437  NA 135  K 4.4  CL 97  CO2 28  BUN 17  CREATININE 0.92  GLUCOSE 78  CALCIUM 8.5   No results found for this basename: LABPT:2,INR:2 in the last 72 hours  Assessment/Plan: Mobilize with PT - hl iv - dc pca   DEAN,GREGORY SCOTT 09/12/2012, 8:38 AM

## 2012-09-12 NOTE — Progress Notes (Signed)
Physical Therapy Treatment Patient Details Name: [name] MRN: [id number] DOB: 07-Oct-1961 Today's Date: 09/12/2012 Time: 0102-7253 PT Time Calculation (min): 25 min  PT Assessment / Plan / Recommendation Comments on Treatment Session  Pt progressing with mobility, she ambulated 31' with RW and min/guard assist. Pain/fatigue limit gait distance. Expect will be ready to DC home tomorrow.    Follow Up Recommendations  Home health PT    Barriers to Discharge        Equipment Recommendations  None recommended by PT    Recommendations for Other Services    Frequency 7X/week   Plan Discharge plan remains appropriate;Frequency remains appropriate    Precautions / Restrictions Precautions Precautions: Knee Required Braces or Orthoses: Knee Immobilizer - Left Restrictions Other Position/Activity Restrictions: WBAT   Pertinent Vitals/Pain *4-5/10 L knee with walking** Pt premedicated, ice applied to L knee    Mobility  Bed Mobility Bed Mobility: Sit to Supine Supine to Sit: 4: Min assist Sit to Supine: 4: Min assist;With rail;HOB flat Details for Bed Mobility Assistance: min A for LLE Transfers Transfers: Sit to Stand;Stand to Sit Sit to Stand: 4: Min guard;From chair/3-in-1 Stand to Sit: 4: Min guard;To bed Details for Transfer Assistance: VCs hand placement Ambulation/Gait Ambulation/Gait Assistance: 4: Min guard Ambulation Distance (Feet): 44 Feet Assistive device: Rolling walker Gait Pattern: Step-to pattern    Exercises Total Joint Exercises Ankle Circles/Pumps: AROM;Both;Supine;10 reps Quad Sets: AROM;Both;10 reps;Supine Towel Squeeze: AROM;Both;10 reps;Supine Short Arc Quad: AAROM;Left;10 reps;Supine Heel Slides: AAROM;Left;10 reps;Supine   PT Diagnosis:    PT Problem List:   PT Treatment Interventions:     PT Goals Acute Rehab PT Goals PT Goal Formulation: With patient Time For Goal Achievement: 09/15/12 Potential to Achieve Goals: Good Pt will  go Supine/Side to Sit: with modified independence;with HOB 0 degrees Pt will go Sit to Stand: with modified independence;with upper extremity assist PT Goal: Sit to Stand - Progress: Progressing toward goal Pt will Ambulate: 51 - 150 feet;with modified independence;with rolling walker PT Goal: Ambulate - Progress: Progressing toward goal Pt will Go Up / Down Stairs: 1-2 stairs;with min assist Pt will Perform Home Exercise Program: Independently PT Goal: Perform Home Exercise Program - Progress: Progressing toward goal  Visit Information  Last PT Received On: 09/12/12 Assistance Needed: +1    Subjective Data  Subjective: How far do I have to walk?   Cognition  Overall Cognitive Status: Appears within functional limits for tasks assessed/performed Arousal/Alertness: Awake/alert Orientation Level: Appears intact for tasks assessed Behavior During Session: [facility] for tasks performed    Balance     End of Session PT - End of Session Equipment Utilized During Treatment: Gait belt;Left knee immobilizer Activity Tolerance: Patient limited by fatigue;Patient limited by pain Patient left: in bed;with call bell/phone within reach Nurse Communication: Mobility status   GP     Ralene Bathe Kistler 09/12/2012, 10:52 AM [phone]

## 2012-09-12 NOTE — Progress Notes (Signed)
Cm spoke with patient concerning dc planning. Per pt choice [facility] to provide [facility] services upon discharge. Pt states having DME for home use due to previous surgery. Pt's spouse to assist in home care. [facility] notified of referral. Md orders faxed to [facility] at [phone]. Confirmation received. No other needs specified.   Leonie Green [phone]

## 2012-09-12 NOTE — Progress Notes (Signed)
Occupational Therapy Treatment Patient Details Name: ARDETH SIELING MRN: [id number] DOB: 06-25-1961 Today's Date: 09/12/2012 Time: 4098-1191 OT Time Calculation (min): 29 min  OT Assessment / Plan / Recommendation Comments on Treatment Session Issued pt reacher and toilet aid:  pt verbalizes understanding on how to use both    Follow Up Recommendations       Barriers to Discharge       Equipment Recommendations       Recommendations for Other Services    Frequency     Plan      Precautions / Restrictions Precautions Precautions: Knee Required Braces or Orthoses: Knee Immobilizer - Left Restrictions Other Position/Activity Restrictions: WBAT   Pertinent Vitals/Pain Pain when standing, 5, L knee, repositioned    ADL  Toilet Transfer: Performed;Supervision/safety (min cues for hand placement) Toilet Transfer Method: Stand pivot Toilet Transfer Equipment: Extra wide bedside commode Toileting - Clothing Manipulation and Hygiene: Performed;Moderate assistance (pt performed sitting; therapist helped in standing) Where Assessed - Toileting Clothing Manipulation and Hygiene: Sit to stand from 3-in-1 or toilet Transfers/Ambulation Related to ADLs: supervision for stand pivot transfers.  Min A for bed mobility (pt 90%) ADL Comments: Pt will have help for adls.  Had other knee done about 8 months ago.  She has had a reacher in the past, but it broke.  Educated on this and toilet aid.        OT Diagnosis:    OT Problem List:   OT Treatment Interventions:     OT Goals ADL Goals Pt Will Transfer to Toilet: with supervision;with DME;Comfort height toilet;Extra wide 3-in-1 ADL Goal: Toilet Transfer - Progress: Progressing toward goals (min cues needed) Pt Will Perform Toileting - Hygiene: with supervision ADL Goal: Toileting - Hygiene - Progress: Goal set today  Visit Information  Last OT Received On: 09/12/12 Assistance Needed: +1    Subjective Data  Subjective: I have to  sit to wipe.  I've never been able to stand Patient Stated Goal: home   Prior Functioning       Cognition  Overall Cognitive Status: Appears within functional limits for tasks assessed/performed Arousal/Alertness: Awake/alert Orientation Level: Appears intact for tasks assessed Behavior During Session: [facility] for tasks performed    Mobility  Shoulder Instructions Bed Mobility Supine to Sit: 4: Min assist Transfers Sit to Stand: 5: Supervision (min cues for hand placement)       Exercises      Balance     End of Session OT - End of Session Activity Tolerance: Patient tolerated treatment well Patient left: in chair;with call bell/phone within reach  GO     [facility] 09/12/2012, 9:43 AM Marica Otter, OTR/L [phone] 09/12/2012

## 2012-09-13 ENCOUNTER — Encounter (HOSPITAL_COMMUNITY): Payer: Self-pay | Admitting: Orthopaedic Surgery

## 2012-09-13 LAB — GLUCOSE, CAPILLARY: Glucose-Capillary: 156 mg/dL — ABNORMAL HIGH (ref 70–99)

## 2012-09-13 LAB — CBC
MCHC: 32.8 g/dL (ref 30.0–36.0)
MCV: 84.6 fL (ref 78.0–100.0)
Platelets: 216 10*3/uL (ref 150–400)
RDW: 14.4 % (ref 11.5–15.5)
WBC: 11.3 10*3/uL — ABNORMAL HIGH (ref 4.0–10.5)

## 2012-09-13 MED ORDER — INFLUENZA VIRUS VACC SPLIT PF IM SUSP
0.5000 mL | INTRAMUSCULAR | Status: AC
  Administered 2012-09-14: 0.5 mL via INTRAMUSCULAR
  Filled 2012-09-13 (×2): qty 0.5

## 2012-09-13 NOTE — Progress Notes (Signed)
Subjective: 3 Days Post-Op Procedure(s) (LRB): TOTAL KNEE ARTHROPLASTY (Left) RELEASE TRIGGER FINGER/A-1 PULLEY (Right) Patient reports pain as moderate.  Asymptomatic acute blood loss anemia.  Objective: Vital signs in last 24 hours: Temp:  [97.4 F (36.3 C)-98.8 F (37.1 C)] 98.2 F (36.8 C) (09/16 0513) Pulse Rate:  [61-95] 94  (09/16 0513) Resp:  [16-20] 16  (09/16 0513) BP: (104-132)/(57-67) 107/64 mmHg (09/16 0513) SpO2:  [92 %-98 %] 96 % (09/16 0513)  Intake/Output from previous day: 09/15 0701 - 09/16 0700 In: 480 [P.O.:480] Out: 1200 [Urine:1200] Intake/Output this shift: Total I/O In: -  Out: 1200 [Urine:1200]   Basename 09/13/12 0430 09/12/12 0409 09/11/12 0437  HGB 9.9* 9.2* 10.3*    Basename 09/13/12 0430 09/12/12 0409  WBC 11.3* 11.1*  RBC 3.57* 3.26*  HCT 30.2* 27.3*  PLT 216 197    Basename 09/11/12 0437  NA 135  K 4.4  CL 97  CO2 28  BUN 17  CREATININE 0.92  GLUCOSE 78  CALCIUM 8.5   No results found for this basename: LABPT:2,INR:2 in the last 72 hours  Sensation intact distally Intact pulses distally Dorsiflexion/Plantar flexion intact Incision: scant drainage No cellulitis present Compartment soft  Assessment/Plan: 3 Days Post-Op Procedure(s) (LRB): TOTAL KNEE ARTHROPLASTY (Left) RELEASE TRIGGER FINGER/A-1 PULLEY (Right) Plan for discharge tomorrow  [name] 09/13/2012, 6:26 AM

## 2012-09-13 NOTE — Progress Notes (Signed)
Physical Therapy Treatment Patient Details Name: [name] MRN: [id number] DOB: 06-07-1961 Today's Date: 09/13/2012 Time: 1191-4782 PT Time Calculation (min): 31 min  PT Assessment / Plan / Recommendation Comments on Treatment Session       Follow Up Recommendations  Home health PT    Barriers to Discharge        Equipment Recommendations  None recommended by PT    Recommendations for Other Services OT consult  Frequency 7X/week   Plan Discharge plan remains appropriate;Frequency remains appropriate    Precautions / Restrictions Precautions Precautions: Knee Required Braces or Orthoses: Knee Immobilizer - Left Restrictions Weight Bearing Restrictions: No Other Position/Activity Restrictions: WBAT   Pertinent Vitals/Pain 4/10; premedicated, ice packs provided    Mobility  Bed Mobility Bed Mobility: Supine to Sit Supine to Sit: 4: Min assist;With rails Details for Bed Mobility Assistance: min A for LLE Transfers Transfers: Sit to Stand;Stand to Sit Sit to Stand: 4: Min guard;From bed Stand to Sit: 4: Min guard;To chair/3-in-1 Details for Transfer Assistance: VCs hand placement Ambulation/Gait Ambulation/Gait Assistance: 4: Min guard Ambulation Distance (Feet): 123 Feet Assistive device: Rolling walker Ambulation/Gait Assistance Details: cues for position from RW and sequence.  Multiple rests required to complete task Gait Pattern: Step-to pattern    Exercises Total Joint Exercises Ankle Circles/Pumps: AROM;Both;Supine;10 reps Quad Sets: AROM;Both;Supine;15 reps Heel Slides: AAROM;Left;Supine;15 reps Straight Leg Raises: AAROM;Left;Supine;15 reps   PT Diagnosis:    PT Problem List:   PT Treatment Interventions:     PT Goals Acute Rehab PT Goals PT Goal Formulation: With patient Time For Goal Achievement: 09/15/12 Potential to Achieve Goals: Good Pt will go Supine/Side to Sit: with modified independence;with HOB 0 degrees PT Goal: Supine/Side to  Sit - Progress: Progressing toward goal Pt will go Sit to Stand: with modified independence;with upper extremity assist PT Goal: Sit to Stand - Progress: Progressing toward goal Pt will Ambulate: 51 - 150 feet;with modified independence;with rolling walker PT Goal: Ambulate - Progress: Progressing toward goal Pt will Go Up / Down Stairs: 1-2 stairs;with min assist PT Goal: Up/Down Stairs - Progress: Progressing toward goal Pt will Perform Home Exercise Program: Independently PT Goal: Perform Home Exercise Program - Progress: Progressing toward goal  Visit Information  Last PT Received On: 09/13/12 Assistance Needed: +1    Subjective Data  Subjective: I know I need to do this Patient Stated Goal: go home   Cognition  Overall Cognitive Status: Appears within functional limits for tasks assessed/performed Arousal/Alertness: Awake/alert Orientation Level: Appears intact for tasks assessed Behavior During Session: [facility] for tasks performed    Balance     End of Session PT - End of Session Equipment Utilized During Treatment: Gait belt;Left knee immobilizer Activity Tolerance: Patient limited by fatigue Patient left: in chair;with call bell/phone within reach Nurse Communication: Mobility status CPM Left Knee CPM Left Knee: Off   GP     [name] 09/13/2012, 12:41 PM

## 2012-09-13 NOTE — Care Management Note (Signed)
    Page 1 of 2   09/13/2012     10:47:30 AM   CARE MANAGEMENT NOTE 09/13/2012  Patient:  [name], [name]   Account Number:  [id number]  Date Initiated:  09/12/2012  Documentation initiated by:  DAVIS,TYMEEKA  Subjective/Objective Assessment:   51 yo female admitted s/p left total knee replacement.     Action/Plan:   home when stable   Anticipated DC Date:  09/14/2012   Anticipated DC Plan:  HOME W HOME HEALTH SERVICES  In-house referral  NA      DC Planning Services  CM consult      [facility] Choice  HOME HEALTH   Choice offered to / List presented to:  C-1 Patient   DME arranged  NA      DME agency  NA     HH arranged  HH-2 PT      HH agency  [facility]   Status of service:  Completed, signed off Medicare Important Message given?   (If response is "NO", the following Medicare IM given date fields will be blank) Date Medicare IM given:   Date Additional Medicare IM given:    Discharge Disposition:  HOME W HOME HEALTH SERVICES  Per UR Regulation:  Reviewed for med. necessity/level of care/duration of stay  If discussed at Long Length of Stay Meetings, dates discussed:    Comments:  09/12/12 1750 Leonie Green [phone] Cm spoke with patient concerning dc planning. Per pt choice [facility] to provide [facility] services upon discharge. Pt states having DME for home use due to previous surgery. Pt's spouse to assist in home care. [facility] notified of referral. Md orders faxed to [facility] at [phone]. Confirmation received. No other needs specified.

## 2012-09-14 LAB — GLUCOSE, CAPILLARY: Glucose-Capillary: 129 mg/dL — ABNORMAL HIGH (ref 70–99)

## 2012-09-14 MED ORDER — METHOCARBAMOL 500 MG PO TABS: 500.0000 mg | ORAL_TABLET | Freq: Four times a day (QID) | ORAL | Status: DC | PRN

## 2012-09-14 MED ORDER — HYDROCODONE-ACETAMINOPHEN 7.5-325 MG PO TABS: 1.0000 | ORAL_TABLET | ORAL | Status: DC | PRN

## 2012-09-14 MED ORDER — ASPIRIN EC 325 MG PO TBEC: 325.0000 mg | DELAYED_RELEASE_TABLET | Freq: Two times a day (BID) | ORAL | Status: DC

## 2012-09-14 NOTE — Discharge Summary (Signed)
Patient ID: [name] MRN: [id number] DOB/AGE: [birth year] 51 y.o.  Admit date: 09/10/2012 Discharge date: 09/14/2012  Admission Diagnoses:  Principal Problem:  *Degenerative arthritis of left knee   Discharge Diagnoses:  Same  Past Medical History  Diagnosis Date  . Asthma   . Hypertension   . Hyperlipidemia   . GERD (gastroesophageal reflux disease)   . Depression   . Aortic stenosis, moderate     per 11/2011 echo  . Blood transfusion     2013[facility]  . Shortness of breath   . Headache     h/o migraines  . Pulmonary embolism 02/08/12    S/P RT TOTAL KNEE ON 02/03/12--ON 02/08/12--DEVELOPED ACUTE SOB AND CHEST PAIN--AND DIAGNOSED WITH  PULMONARY EMBOLUS AND PNEUMONIA  . Restless leg syndrome   . Myocardial infarction     PT THINKS SHE WAS DX WITH MI AT THE TIME OF HEART STENTING  . CAD (coronary artery disease)     Cath 2010 with DES x 1 RCA-- PT'S CARDIOLOGIST IS DR. MCALHANY  . Uterine fibroid     NO PROBLEMS AT PRESENT FROM THE FIBROIDS-STATES SHE IS POST MENOPAUSAL-LAST MENSES 2010 EXCEPT FOR EPISODE THIS YR OF BLEEDING RELATED TO FIBROIDS.  Marland Kitchen Heart murmur   . Pneumonia 02/08/12    HOSPITALIZED AT [facility] WITH PNEUMONIA AND WITH PULMONARY EMBOLUS  . Diabetes mellitus DIAGNOSED IN2010    ORAL MEDS  . Chronic kidney disease     PT HAS KIDNEY STONES PER XRAY--NO PROBLEMS AT PRESENT TIME FROM THE STONES; IN THE PAST SHE PASSED A STONE  . Arthritis     PAIN AND SEVERE OA LEFT KNEE ; S/P RIGHT TKA ON 02/03/12; HAS LOWER BACK PAIN-UNABLE TO STAND MORE THAN 10 MIN; ARTHRITIS "ALL OVER"  . Sleep apnea 09/03/12    STOP BANG SCORE OF 7  . Tingling     RIGHT ARM AND HAND  . Neck pain   . Weakness     BOTH HANDS - S/P BILATERAL CARPAL TUNNEL RELEASE--BUT STILL HAS WEAKNESS--OFTEN DROPS THINGS    Surgeries: Procedure(s): TOTAL KNEE ARTHROPLASTY RELEASE TRIGGER FINGER/A-1 PULLEY on 09/10/2012   Consultants:    Discharged Condition: Improved  Hospital Course: [name] is an 51 y.o. female who was admitted 09/10/2012 for operative treatment ofDegenerative arthritis of left knee. Patient has severe unremitting pain that affects sleep, daily activities, and work/hobbies. After pre-op clearance the patient was taken to the operating room on 09/10/2012 and underwent  Procedure(s): TOTAL KNEE ARTHROPLASTY RELEASE TRIGGER FINGER/A-1 PULLEY.    Patient was given perioperative antibiotics: Anti-infectives     Start     Dose/Rate Route Frequency Ordered Stop   09/10/12 1800   ceFAZolin (ANCEF) IVPB 2 g/50 mL premix        2 g 100 mL/hr over 30 Minutes Intravenous Every 6 hours 09/10/12 1622 09/11/12 0005   09/10/12 0800   ceFAZolin (ANCEF) 3 g in dextrose 5 % 50 mL IVPB        3 g 160 mL/hr over 30 Minutes Intravenous 60 min pre-op 09/10/12 0800 09/10/12 1000           Patient was given sequential compression devices, early ambulation, and chemoprophylaxis to prevent DVT.  Patient benefited maximally from hospital stay and there were no complications.    Recent vital signs: Patient Vitals for the past 24 hrs:  BP Temp Temp src Pulse Resp SpO2  09/14/12 0615 132/63 mmHg 98.7 F (37.1 C) Oral 80  18  91 %  09/13/12 2126 90/54 mmHg 98.7 F (37.1 C) Oral 106  20  90 %  09/13/12 2119 - - - - - 96 %  09/13/12 1936 - - - - 18  92 %  09/13/12 1400 106/32 mmHg 98.5 F (36.9 C) Oral 95  19  94 %  09/13/12 1313 114/69 mmHg 98.8 F (37.1 C) Oral 98  20  -  09/13/12 1200 - - - - 18  92 %  09/13/12 1010 - - - - - 96 %  09/13/12 0800 - - - - 16  96 %     Recent laboratory studies:  Basename 09/13/12 0430 10-12-2012 0409  WBC 11.3* 11.1*  HGB 9.9* 9.2*  HCT 30.2* 27.3*  PLT 216 197  NA -- --  K -- --  CL -- --  CO2 -- --  BUN -- --  CREATININE -- --  GLUCOSE -- --  INR -- --  CALCIUM -- --     Discharge Medications:     Medication List     As of 09/14/2012  6:25 AM    STOP taking these medications         aspirin EC 81 MG tablet       diclofenac 25 MG EC tablet   Commonly known as: VOLTAREN      TAKE these medications         albuterol 108 (90 BASE) MCG/ACT inhaler   Commonly known as: PROVENTIL HFA;VENTOLIN HFA   Inhale 1 puff into the lungs every 6 (six) hours as needed. Shortness of breath.      atorvastatin 80 MG tablet   Commonly known as: LIPITOR   Take 1 tablet (80 mg total) by mouth at bedtime.      clopidogrel 75 MG tablet   Commonly known as: PLAVIX   Take 75 mg by mouth daily.      escitalopram 20 MG tablet   Commonly known as: LEXAPRO   Take 20 mg by mouth daily with breakfast.      Fluticasone-Salmeterol 250-50 MCG/DOSE Aepb   Commonly known as: ADVAIR   Inhale 1 puff into the lungs 2 (two) times daily.      furosemide 40 MG tablet   Commonly known as: LASIX   Take 1 tablet (40 mg total) by mouth 2 (two) times daily at 10 am and 4 pm.      gabapentin 300 MG capsule   Commonly known as: NEURONTIN   Take 300 mg by mouth 2 (two) times daily.      glipiZIDE 5 MG tablet   Commonly known as: GLUCOTROL   Take 5 mg by mouth 2 (two) times daily before a meal.      hydrochlorothiazide 25 MG tablet   Commonly known as: HYDRODIURIL   Take 25 mg by mouth daily with breakfast.      HYDROcodone-acetaminophen 7.5-325 MG per tablet   Commonly known as: NORCO   Take 1-2 tablets by mouth every 4 (four) hours as needed for pain.      losartan 100 MG tablet   Commonly known as: COZAAR   Take 100 mg by mouth daily with breakfast.      methocarbamol 500 MG tablet   Commonly known as: ROBAXIN   Take 1 tablet (500 mg total) by mouth every 6 (six) hours as needed.      potassium chloride SA 20 MEQ tablet   Commonly known as: K-DUR,KLOR-CON   Take 2  tablets (40 mEq total) by mouth daily after breakfast.        Diagnostic Studies: X-ray Knee Left Port  09/10/2012  *RADIOLOGY REPORT*  Clinical Data: Left knee arthroplasty.  PORTABLE LEFT KNEE - 1-2 VIEW  Comparison: None.  Findings: Two views of the  knee demonstrate a total knee arthroplasty.  Surgical drain is in place.  Surgical skin staples present. Expected postoperative soft tissue changes.  No evidence for a periprosthetic fracture. The knee is located.  IMPRESSION: Left knee arthroplasty without complicating features.   Original Report Authenticated By: Richarda Overlie, M.D.     Disposition: 01-Home or Self Care      Discharge Orders    Future Appointments: Provider: Department: Dept Phone: Center:   11/15/2012 9:15 AM [facility] [phone] [facility]     Future Orders Please Complete By Expires   Diet - low sodium heart healthy      Call MD / Call 911      Comments:   If you experience chest pain or shortness of breath, CALL 911 and be transported to the hospital emergency room.  If you develope a fever above 101 F, pus (white drainage) or increased drainage or redness at the wound, or calf pain, call your surgeon's office.   Constipation Prevention      Comments:   Drink plenty of fluids.  Prune juice may be helpful.  You may use a stool softener, such as Colace (over the counter) 100 mg twice a day.  Use MiraLax (over the counter) for constipation as needed.   Increase activity slowly as tolerated      Discharge instructions      Comments:   Increase your activities as comfort allows. Ice and elevation for swelling. You can get your actual incision wet in the shower starting 9/19.   Discharge patient         Follow-up Information    Follow up with [name], MD. In 2 weeks.   Contact information:   [facility] [address] [place] [address] [phone]           Signed: [name] 09/14/2012, 6:25 AM

## 2012-09-14 NOTE — Progress Notes (Signed)
Physical Therapy Treatment Patient Details Name: [name] MRN: [id number] DOB: 1961/12/05 Today's Date: 09/14/2012 Time: 5956-3875 PT Time Calculation (min): 20 min  PT Assessment / Plan / Recommendation Comments on Treatment Session   Pt amb self around room, packing.  Pt eager to D/c to home.    Follow Up Recommendations       Barriers to Discharge        Equipment Recommendations       Recommendations for Other Services    Frequency     Plan      Precautions / Restrictions Precautions Precautions: Knee Precaution Comments: Instructed pt to wear L KI for stairs Required Braces or Orthoses: Knee Immobilizer - Left Restrictions Weight Bearing Restrictions: No Other Position/Activity Restrictions: WBAT    Pertinent Vitals/Pain C/o "soreness"    Mobility  Bed Mobility Bed Mobility: Not assessed Details for Bed Mobility Assistance: Pt OOB in recliner  Transfers Transfers: Sit to Stand;Stand to Sit Sit to Stand: 6: Modified independent (Device/Increase time);From chair/3-in-1;From toilet Stand to Sit: 6: Modified independent (Device/Increase time);To chair/3-in-1 Details for Transfer Assistance: increased time  Ambulation/Gait Ambulation/Gait Assistance: 5: Supervision Ambulation Distance (Feet): 35 Feet Assistive device: Rolling walker Ambulation/Gait Assistance Details: 25% VC's on safety with turns and proper walker to self distance Gait Pattern: Step-to pattern;Decreased stance time - left  Stairs: Yes Stairs Assistance: 5: Supervision Stairs Assistance Details (indicate cue type and reason): <25% VC's on proper sequencing and increased time.  At first attempted w/o KI however pt unable to trust L LE so applied KI for 2nd attempt.  Instructed pt to wear L KI today to get into the house. Stair Management Technique: Two rails Number of Stairs: 2     PT Goals     progressing    Visit Information  Last PT Received On: 09/14/12 Assistance Needed:  +1                        Felecia Shelling  PTA [facility]  Acute  Rehab Pager     [phone]

## 2012-11-15 ENCOUNTER — Other Ambulatory Visit: Payer: Medicaid Other

## 2012-11-16 ENCOUNTER — Other Ambulatory Visit (INDEPENDENT_AMBULATORY_CARE_PROVIDER_SITE_OTHER): Payer: Medicaid Other

## 2012-11-16 DIAGNOSIS — E785 Hyperlipidemia, unspecified: Secondary | ICD-10-CM

## 2012-11-16 LAB — LIPID PANEL
Cholesterol: 214 mg/dL — ABNORMAL HIGH (ref 0–200)
HDL: 51.6 mg/dL (ref >39.00)
Triglycerides: 74 mg/dL (ref 0.0–149.0)

## 2012-11-16 LAB — HEPATIC FUNCTION PANEL
ALT: 8 U/L (ref 0–35)
AST: 21 U/L (ref 0–37)
Albumin: 3.4 g/dL — ABNORMAL LOW (ref 3.5–5.2)
Total Bilirubin: 1.4 mg/dL — ABNORMAL HIGH (ref 0.3–1.2)

## 2012-11-16 LAB — LDL CHOLESTEROL, DIRECT: Direct LDL: 155.4 mg/dL

## 2012-11-26 ENCOUNTER — Ambulatory Visit: Payer: Medicaid Other | Admitting: Pharmacist

## 2012-11-29 ENCOUNTER — Ambulatory Visit: Payer: Medicaid Other | Admitting: Pharmacist

## 2012-11-30 ENCOUNTER — Ambulatory Visit (INDEPENDENT_AMBULATORY_CARE_PROVIDER_SITE_OTHER): Payer: Medicaid Other | Admitting: Pharmacist

## 2012-11-30 ENCOUNTER — Encounter: Payer: Self-pay | Admitting: Pharmacist

## 2012-11-30 VITALS — Wt 331.5 lb

## 2012-11-30 DIAGNOSIS — E785 Hyperlipidemia, unspecified: Secondary | ICD-10-CM

## 2012-11-30 MED ORDER — EZETIMIBE 10 MG PO TABS: 10.0000 mg | ORAL_TABLET | Freq: Every day | ORAL | Status: DC

## 2012-11-30 NOTE — Patient Instructions (Addendum)
Continue Lipitor 80mg  once daily  Start Zetia 10mg  once daily  Try to cut down on your portion sizes with each meal  Set a goal to walk part of your driveway at least 3-4 days per week  Recheck labs in 6 weeks.

## 2012-11-30 NOTE — Assessment & Plan Note (Signed)
Reviewed pt's labs.  TC- 214 (goal<200), TG- 74 (goal<150), HDL- 52.6 (goal>45), LDL- 161 (goal<70).  LFTs are WNL.  Pt tolerating Lipitor 80mg .  Unsure if changing to Crestor 40mg  will give enough of an LDL reduction to get to goal.  Discussed adding Zetia to try for larger LDL reduction.  Pt agreeable.  She is on medicaid.  Unfortunately Lipitor is the highest potency statin on their preferred list.  If pt gets to goal with Zetia, may need to complete PA through medicaid in order for it to be covered. Encouraged pt to cut down on portion sizes and only eat 1 plate of food at each meal.  Asked her to starting walking down part of her driveway a few days of the week to help with conditioning.  Will follow up in 6 weeks.

## 2012-11-30 NOTE — Progress Notes (Signed)
HPI  [name] is a 51 yo F who was referred to the lipid clinic by Dr. Clifton James.  She has a history of CAD, OSA, HTN, HLD, obesity, GERD, asthma, COPD, former tobacco abuse, and depression.  She has been treating her cholesterol with Lipitor for years and has had no problems tolerating it.  She does not remember taking any other medications for cholesterol.  After her last visit with Dr. Clifton James, her lipitor was increased from 40mg  to 80mg  but when LDL was rechecked last week, there was minimal change.  Pt agreed to come to lipid clinic at that time.   Reviewed pt's diet.  She likes to have bacon or sausage, eggs, toast, and biscuits and gravy about 2 times per week.  The other days she will have cereal.  Lunch may be leftovers from the night before, sandwich, or her just snacking on "junk" such as cinnamon rolls or Cheetos.  She does have dinner at home most nights; her husband does not like to go out to eat.  She does bake her meat majority of the time and likes potatoes, rice, green beans, pinto beans, etc as her side dishes.  She will have salads sometimes.  She admits to eating more than one plate majority of the time.  If she does go out, it is [employer], [employer]'s, [employer]'s, or [place].  She will eat desserts if they are in the house.  She snacks on fruit or trail mix if it is available.  She drinks coffee, juice, and seltzer water.    Pt admits that she is a "lazy person" and does not exercise at all.  She has a history of knee replacements but states her walking is limited to SOB related to her weight and COPD more than anything else.  She stated walking into the clinic today was a challenge for her.  Whenever she goes to the store, she makes sure there is a motorized cart to ride in or she will not stay at the store.    Current Outpatient Prescriptions on File Prior to Visit  Medication Sig Dispense Refill  . albuterol (PROVENTIL HFA;VENTOLIN HFA) 108 (90 BASE) MCG/ACT inhaler Inhale 1 puff  into the lungs every 6 (six) hours as needed. Shortness of breath.      Marland Kitchen aspirin EC 325 MG tablet Take 1 tablet (325 mg total) by mouth 2 (two) times daily.  30 tablet  0  . atorvastatin (LIPITOR) 80 MG tablet Take 1 tablet (80 mg total) by mouth at bedtime.  30 tablet  6  . clopidogrel (PLAVIX) 75 MG tablet Take 75 mg by mouth daily.       Marland Kitchen escitalopram (LEXAPRO) 20 MG tablet Take 20 mg by mouth daily with breakfast.       . ezetimibe (ZETIA) 10 MG tablet Take 1 tablet (10 mg total) by mouth daily.  48 tablet  0  . Fluticasone-Salmeterol (ADVAIR) 250-50 MCG/DOSE AEPB Inhale 1 puff into the lungs 2 (two) times daily.       . furosemide (LASIX) 40 MG tablet Take 1 tablet (40 mg total) by mouth 2 (two) times daily at 10 am and 4 pm.  60 tablet  0  . gabapentin (NEURONTIN) 300 MG capsule Take 300 mg by mouth 2 (two) times daily.       Marland Kitchen glipiZIDE (GLUCOTROL) 5 MG tablet Take 5 mg by mouth 2 (two) times daily before a meal.       . hydrochlorothiazide (HYDRODIURIL) 25  MG tablet Take 25 mg by mouth daily with breakfast.       . HYDROcodone-acetaminophen (NORCO) 7.5-325 MG per tablet Take 1-2 tablets by mouth every 4 (four) hours as needed for pain.  60 tablet  0  . losartan (COZAAR) 100 MG tablet Take 100 mg by mouth daily with breakfast.       . methocarbamol (ROBAXIN) 500 MG tablet Take 1 tablet (500 mg total) by mouth every 6 (six) hours as needed.  60 tablet  0  . potassium chloride SA (K-DUR,KLOR-CON) 20 MEQ tablet Take 2 tablets (40 mEq total) by mouth daily after breakfast.  30 tablet  0    No Known Allergies

## 2013-01-01 ENCOUNTER — Encounter (HOSPITAL_COMMUNITY): Payer: Self-pay | Admitting: Emergency Medicine

## 2013-01-01 ENCOUNTER — Emergency Department (HOSPITAL_COMMUNITY)
Admission: EM | Admit: 2013-01-01 | Discharge: 2013-01-01 | Disposition: A | Discharge status: Home / Self Care | Payer: Medicaid Other | Attending: Emergency Medicine | Admitting: Emergency Medicine

## 2013-01-01 DIAGNOSIS — N189 Chronic kidney disease, unspecified: Secondary | ICD-10-CM | POA: Insufficient documentation

## 2013-01-01 DIAGNOSIS — M542 Cervicalgia: Secondary | ICD-10-CM | POA: Insufficient documentation

## 2013-01-01 DIAGNOSIS — Z87891 Personal history of nicotine dependence: Secondary | ICD-10-CM | POA: Insufficient documentation

## 2013-01-01 DIAGNOSIS — K219 Gastro-esophageal reflux disease without esophagitis: Secondary | ICD-10-CM | POA: Insufficient documentation

## 2013-01-01 DIAGNOSIS — Z7901 Long term (current) use of anticoagulants: Secondary | ICD-10-CM | POA: Insufficient documentation

## 2013-01-01 DIAGNOSIS — Z86711 Personal history of pulmonary embolism: Secondary | ICD-10-CM | POA: Insufficient documentation

## 2013-01-01 DIAGNOSIS — F329 Major depressive disorder, single episode, unspecified: Secondary | ICD-10-CM | POA: Insufficient documentation

## 2013-01-01 DIAGNOSIS — I129 Hypertensive chronic kidney disease with stage 1 through stage 4 chronic kidney disease, or unspecified chronic kidney disease: Secondary | ICD-10-CM | POA: Insufficient documentation

## 2013-01-01 DIAGNOSIS — Z8679 Personal history of other diseases of the circulatory system: Secondary | ICD-10-CM | POA: Insufficient documentation

## 2013-01-01 DIAGNOSIS — Y929 Unspecified place or not applicable: Secondary | ICD-10-CM | POA: Insufficient documentation

## 2013-01-01 DIAGNOSIS — Z79899 Other long term (current) drug therapy: Secondary | ICD-10-CM | POA: Insufficient documentation

## 2013-01-01 DIAGNOSIS — I2510 Atherosclerotic heart disease of native coronary artery without angina pectoris: Secondary | ICD-10-CM | POA: Insufficient documentation

## 2013-01-01 DIAGNOSIS — I252 Old myocardial infarction: Secondary | ICD-10-CM | POA: Insufficient documentation

## 2013-01-01 DIAGNOSIS — W57XXXA Bitten or stung by nonvenomous insect and other nonvenomous arthropods, initial encounter: Secondary | ICD-10-CM | POA: Insufficient documentation

## 2013-01-01 DIAGNOSIS — Y939 Activity, unspecified: Secondary | ICD-10-CM | POA: Insufficient documentation

## 2013-01-01 DIAGNOSIS — Z8669 Personal history of other diseases of the nervous system and sense organs: Secondary | ICD-10-CM | POA: Insufficient documentation

## 2013-01-01 DIAGNOSIS — R21 Rash and other nonspecific skin eruption: Secondary | ICD-10-CM | POA: Insufficient documentation

## 2013-01-01 DIAGNOSIS — R011 Cardiac murmur, unspecified: Secondary | ICD-10-CM | POA: Insufficient documentation

## 2013-01-01 DIAGNOSIS — J45909 Unspecified asthma, uncomplicated: Secondary | ICD-10-CM | POA: Insufficient documentation

## 2013-01-01 DIAGNOSIS — G2581 Restless legs syndrome: Secondary | ICD-10-CM | POA: Insufficient documentation

## 2013-01-01 DIAGNOSIS — Z9861 Coronary angioplasty status: Secondary | ICD-10-CM | POA: Insufficient documentation

## 2013-01-01 DIAGNOSIS — S1096XA Insect bite of unspecified part of neck, initial encounter: Secondary | ICD-10-CM | POA: Insufficient documentation

## 2013-01-01 DIAGNOSIS — Z8739 Personal history of other diseases of the musculoskeletal system and connective tissue: Secondary | ICD-10-CM | POA: Insufficient documentation

## 2013-01-01 DIAGNOSIS — Z8701 Personal history of pneumonia (recurrent): Secondary | ICD-10-CM | POA: Insufficient documentation

## 2013-01-01 DIAGNOSIS — F3289 Other specified depressive episodes: Secondary | ICD-10-CM | POA: Insufficient documentation

## 2013-01-01 DIAGNOSIS — E119 Type 2 diabetes mellitus without complications: Secondary | ICD-10-CM | POA: Insufficient documentation

## 2013-01-01 DIAGNOSIS — E785 Hyperlipidemia, unspecified: Secondary | ICD-10-CM | POA: Insufficient documentation

## 2013-01-01 DIAGNOSIS — Z8742 Personal history of other diseases of the female genital tract: Secondary | ICD-10-CM | POA: Insufficient documentation

## 2013-01-01 DIAGNOSIS — Z7982 Long term (current) use of aspirin: Secondary | ICD-10-CM | POA: Insufficient documentation

## 2013-01-01 MED ORDER — IBUPROFEN 800 MG PO TABS: 800.0000 mg | ORAL_TABLET | Freq: Three times a day (TID) | ORAL | Status: DC

## 2013-01-01 NOTE — ED Notes (Signed)
Patient reports bilateral leg and bilateral arm pain x 2 weeks.  Patient reports starting new birth control 2 months ago.  Patient also c/o headache since Tuesday.

## 2013-01-01 NOTE — ED Provider Notes (Signed)
Medical screening examination/treatment/procedure(s) were performed by non-physician practitioner and as supervising physician I was immediately available for consultation/collaboration.  Roni Scow, MD 01/01/13 1441 

## 2013-01-01 NOTE — ED Provider Notes (Signed)
History     CSN: [id number]  Arrival date & time 01/01/13  1202   First MD Initiated Contact with Patient 01/01/13 1239      Chief Complaint  Patient presents with  . Insect Bite    (Consider location/radiation/quality/duration/timing/severity/associated sxs/prior treatment) [name] presenting with rash. The history is provided by the patient.  Rash  This is a new problem. The current episode started more than 2 days ago. There has been no fever. The rash is present on the scalp. Associated symptoms comments: The patient reports painful swelling in left scalp x 3 days thought to be an insect bite. She has pain in the general area extending into the left lateral neck. No fever. No drainage at the site. .    Past Medical History  Diagnosis Date  . Asthma   . Hypertension   . Hyperlipidemia   . GERD (gastroesophageal reflux disease)   . Depression   . Aortic stenosis, moderate     per 11/2011 echo  . Blood transfusion     2013[facility]  . Shortness of breath   . Headache     h/o migraines  . Pulmonary embolism 02/08/12    S/P RT TOTAL KNEE ON 02/03/12--ON 02/08/12--DEVELOPED ACUTE SOB AND CHEST PAIN--AND DIAGNOSED WITH  PULMONARY EMBOLUS AND PNEUMONIA  . Restless leg syndrome   . Myocardial infarction     PT THINKS SHE WAS DX WITH MI AT THE TIME OF HEART STENTING  . CAD (coronary artery disease)     Cath 2010 with DES x 1 RCA-- PT'S CARDIOLOGIST IS DR. MCALHANY  . Uterine fibroid     NO PROBLEMS AT PRESENT FROM THE FIBROIDS-STATES SHE IS POST MENOPAUSAL-LAST MENSES 2010 EXCEPT FOR EPISODE THIS YR OF BLEEDING RELATED TO FIBROIDS.  Marland Kitchen Heart murmur   . Pneumonia 02/08/12    HOSPITALIZED AT [facility] WITH PNEUMONIA AND WITH PULMONARY EMBOLUS  . Diabetes mellitus DIAGNOSED IN2010    ORAL MEDS  . Chronic kidney disease     PT HAS KIDNEY STONES PER XRAY--NO PROBLEMS AT PRESENT TIME FROM THE STONES; IN THE PAST SHE PASSED A STONE  . Arthritis     PAIN AND SEVERE OA LEFT  KNEE ; S/P RIGHT TKA ON 02/03/12; HAS LOWER BACK PAIN-UNABLE TO STAND MORE THAN 10 MIN; ARTHRITIS "ALL OVER"  . Sleep apnea 09/03/12    STOP BANG SCORE OF 7  . Tingling     RIGHT ARM AND HAND  . Neck pain   . Weakness     BOTH HANDS - S/P BILATERAL CARPAL TUNNEL RELEASE--BUT STILL HAS WEAKNESS--OFTEN DROPS THINGS    Past Surgical History  Procedure Date  . Tubal ligation   . Carpal tunnel release     Bilateral  . Cholecystectomy   . Hernia repair   . Cardiac catheterization   . Knee arthroplasty 02/03/2012    Procedure: COMPUTER ASSISTED TOTAL KNEE ARTHROPLASTY;  Surgeon: [name], MD;  Location: [facility] OR;  Service: Orthopedics;  Laterality: Right;  Right total knee arthroplasty  . Coronary angioplasty     2010 has stent in place  . Total knee arthroplasty 09/10/2012    Procedure: TOTAL KNEE ARTHROPLASTY;  Surgeon: [name], MD;  Location: [facility] ORS;  Service: Orthopedics;  Laterality: Left;  . Trigger finger release 09/10/2012    Procedure: RELEASE TRIGGER FINGER/A-1 PULLEY;  Surgeon: [name], MD;  Location: [facility] ORS;  Service: Orthopedics;  Laterality: Right;  Right Ring  Finger    Family History  Problem Relation Age of Onset  . Cancer Mother     Breast  . COPD Father   . Cancer Brother     Sinus    History  Substance Use Topics  . Smoking status: Former Smoker -- 1.5 packs/day for 30 years    Types: Cigarettes  . Smokeless tobacco: Not on file     Comment:  quit 12 -13 years ago  . Alcohol Use: No    OB History    Grav Para Term Preterm Abortions TAB SAB Ect Mult Living                  Review of Systems  Constitutional: Negative for fever.  HENT: Positive for neck pain. Negative for facial swelling.   Eyes: Negative for pain.  Respiratory: Negative for shortness of breath.   Gastrointestinal: Negative for nausea.  Musculoskeletal: Negative for myalgias.  Skin: Positive for rash.  Psychiatric/Behavioral: Negative for confusion.     Allergies  Review of patient's allergies indicates no known allergies.  Home Medications   Current Outpatient Rx  Name  Route  Sig  Dispense  Refill  . ALBUTEROL SULFATE HFA 108 (90 BASE) MCG/ACT IN AERS   Inhalation   Inhale 1 puff into the lungs every 6 (six) hours as needed. Shortness of breath.         . ASPIRIN EC 325 MG PO TBEC   Oral   Take 325 mg by mouth 2 (two) times daily.         . ATORVASTATIN CALCIUM 80 MG PO TABS   Oral   Take 1 tablet (80 mg total) by mouth at bedtime.   30 tablet   6   . CLOPIDOGREL BISULFATE 75 MG PO TABS   Oral   Take 75 mg by mouth daily.          Marland Kitchen ESCITALOPRAM OXALATE 20 MG PO TABS   Oral   Take 20 mg by mouth daily with breakfast.          . EZETIMIBE 10 MG PO TABS   Oral   Take 10 mg by mouth daily.         Marland Kitchen FLUTICASONE-SALMETEROL 250-50 MCG/DOSE IN AEPB   Inhalation   Inhale 1 puff into the lungs 2 (two) times daily.          . FUROSEMIDE 40 MG PO TABS   Oral   Take 1 tablet (40 mg total) by mouth 2 (two) times daily at 10 am and 4 pm.   60 tablet   0   . GABAPENTIN 300 MG PO CAPS   Oral   Take 300 mg by mouth 2 (two) times daily.          Marland Kitchen GLIPIZIDE 5 MG PO TABS   Oral   Take 5 mg by mouth 2 (two) times daily before a meal.          . HYDROCHLOROTHIAZIDE 25 MG PO TABS   Oral   Take 25 mg by mouth daily with breakfast.          . HYDROCODONE-ACETAMINOPHEN 7.5-325 MG PO TABS   Oral   Take 1-2 tablets by mouth every 4 (four) hours as needed.         Marland Kitchen LOSARTAN POTASSIUM 100 MG PO TABS   Oral   Take 100 mg by mouth daily with breakfast.          . POTASSIUM  CHLORIDE CRYS ER 20 MEQ PO TBCR   Oral   Take 2 tablets (40 mEq total) by mouth daily after breakfast.   30 tablet   0     BP 156/69  Pulse 85  Temp 97.8 F (36.6 C) (Oral)  Resp 18  SpO2 96%  Physical Exam  Constitutional: She appears well-developed and well-nourished. No distress.  HENT:  Head: Normocephalic.    Right Ear: External ear normal.  Left Ear: External ear normal.  Eyes: Conjunctivae normal are normal. Pupils are equal, round, and reactive to light.  Neck: Normal range of motion. Neck supple.  Lymphadenopathy:    She has no cervical adenopathy.  Skin: Skin is warm and dry.       Singular lesion left scalp above ear without induration or fluctuance. No pustule or blister. Minimally red. No surrounding tenderness.     ED Course  Procedures (including critical care time)  Labs Reviewed - No data to display No results found.   No diagnosis found. 1. Insect bite.   MDM  Singular lesion without blister - doubt shingles but could be early presentation. Discussed with patient. Suspect insect vs spider bite without abscess. Treat symptomatically and recommend anti-inflammatories.        Arnoldo Hooker, PA-C 01/01/13 1339

## 2013-01-01 NOTE — ED Notes (Signed)
Pt reports possible insect bite to left side of hair just above hairline.  Pt states she noticed it 3 days ago when she woke up.  Pt noted to have small raised reddened area left left scalp, approx 1cm in diameter.  Pt denies N/V, fever, chills.

## 2013-01-05 ENCOUNTER — Other Ambulatory Visit (INDEPENDENT_AMBULATORY_CARE_PROVIDER_SITE_OTHER): Payer: Medicaid Other

## 2013-01-05 DIAGNOSIS — E785 Hyperlipidemia, unspecified: Secondary | ICD-10-CM

## 2013-01-05 LAB — LIPID PANEL
Cholesterol: 162 mg/dL (ref 0–200)
HDL: 40.3 mg/dL (ref >39.00)
LDL Cholesterol: 100 mg/dL — ABNORMAL HIGH (ref 0–99)
Triglycerides: 109 mg/dL (ref 0.0–149.0)
VLDL: 21.8 mg/dL (ref 0.0–40.0)

## 2013-01-05 LAB — HEPATIC FUNCTION PANEL
Albumin: 3.4 g/dL — ABNORMAL LOW (ref 3.5–5.2)
Total Bilirubin: 1.6 mg/dL — ABNORMAL HIGH (ref 0.3–1.2)

## 2013-01-06 ENCOUNTER — Emergency Department (HOSPITAL_COMMUNITY)
Admission: EM | Admit: 2013-01-06 | Discharge: 2013-01-06 | Disposition: A | Discharge status: Home / Self Care | Payer: Medicaid Other | Attending: Emergency Medicine | Admitting: Emergency Medicine

## 2013-01-06 ENCOUNTER — Encounter (HOSPITAL_COMMUNITY): Payer: Self-pay | Admitting: Emergency Medicine

## 2013-01-06 DIAGNOSIS — N189 Chronic kidney disease, unspecified: Secondary | ICD-10-CM | POA: Insufficient documentation

## 2013-01-06 DIAGNOSIS — K219 Gastro-esophageal reflux disease without esophagitis: Secondary | ICD-10-CM | POA: Insufficient documentation

## 2013-01-06 DIAGNOSIS — S139XXA Sprain of joints and ligaments of unspecified parts of neck, initial encounter: Secondary | ICD-10-CM | POA: Insufficient documentation

## 2013-01-06 DIAGNOSIS — Z8701 Personal history of pneumonia (recurrent): Secondary | ICD-10-CM | POA: Insufficient documentation

## 2013-01-06 DIAGNOSIS — E119 Type 2 diabetes mellitus without complications: Secondary | ICD-10-CM | POA: Insufficient documentation

## 2013-01-06 DIAGNOSIS — Z8669 Personal history of other diseases of the nervous system and sense organs: Secondary | ICD-10-CM | POA: Insufficient documentation

## 2013-01-06 DIAGNOSIS — Y939 Activity, unspecified: Secondary | ICD-10-CM | POA: Insufficient documentation

## 2013-01-06 DIAGNOSIS — X58XXXA Exposure to other specified factors, initial encounter: Secondary | ICD-10-CM | POA: Insufficient documentation

## 2013-01-06 DIAGNOSIS — R21 Rash and other nonspecific skin eruption: Secondary | ICD-10-CM | POA: Insufficient documentation

## 2013-01-06 DIAGNOSIS — F329 Major depressive disorder, single episode, unspecified: Secondary | ICD-10-CM | POA: Insufficient documentation

## 2013-01-06 DIAGNOSIS — Z8709 Personal history of other diseases of the respiratory system: Secondary | ICD-10-CM | POA: Insufficient documentation

## 2013-01-06 DIAGNOSIS — Z8679 Personal history of other diseases of the circulatory system: Secondary | ICD-10-CM | POA: Insufficient documentation

## 2013-01-06 DIAGNOSIS — S46819A Strain of other muscles, fascia and tendons at shoulder and upper arm level, unspecified arm, initial encounter: Secondary | ICD-10-CM

## 2013-01-06 DIAGNOSIS — Z86711 Personal history of pulmonary embolism: Secondary | ICD-10-CM | POA: Insufficient documentation

## 2013-01-06 DIAGNOSIS — R110 Nausea: Secondary | ICD-10-CM | POA: Insufficient documentation

## 2013-01-06 DIAGNOSIS — I2510 Atherosclerotic heart disease of native coronary artery without angina pectoris: Secondary | ICD-10-CM | POA: Insufficient documentation

## 2013-01-06 DIAGNOSIS — E756 Lipid storage disorder, unspecified: Secondary | ICD-10-CM | POA: Insufficient documentation

## 2013-01-06 DIAGNOSIS — Z8742 Personal history of other diseases of the female genital tract: Secondary | ICD-10-CM | POA: Insufficient documentation

## 2013-01-06 DIAGNOSIS — F3289 Other specified depressive episodes: Secondary | ICD-10-CM | POA: Insufficient documentation

## 2013-01-06 DIAGNOSIS — Z8739 Personal history of other diseases of the musculoskeletal system and connective tissue: Secondary | ICD-10-CM | POA: Insufficient documentation

## 2013-01-06 DIAGNOSIS — Z862 Personal history of diseases of the blood and blood-forming organs and certain disorders involving the immune mechanism: Secondary | ICD-10-CM | POA: Insufficient documentation

## 2013-01-06 DIAGNOSIS — I252 Old myocardial infarction: Secondary | ICD-10-CM | POA: Insufficient documentation

## 2013-01-06 DIAGNOSIS — Z87891 Personal history of nicotine dependence: Secondary | ICD-10-CM | POA: Insufficient documentation

## 2013-01-06 DIAGNOSIS — E785 Hyperlipidemia, unspecified: Secondary | ICD-10-CM | POA: Insufficient documentation

## 2013-01-06 DIAGNOSIS — J45909 Unspecified asthma, uncomplicated: Secondary | ICD-10-CM | POA: Insufficient documentation

## 2013-01-06 DIAGNOSIS — I129 Hypertensive chronic kidney disease with stage 1 through stage 4 chronic kidney disease, or unspecified chronic kidney disease: Secondary | ICD-10-CM | POA: Insufficient documentation

## 2013-01-06 DIAGNOSIS — Y929 Unspecified place or not applicable: Secondary | ICD-10-CM | POA: Insufficient documentation

## 2013-01-06 DIAGNOSIS — R011 Cardiac murmur, unspecified: Secondary | ICD-10-CM | POA: Insufficient documentation

## 2013-01-06 MED ORDER — OXYCODONE-ACETAMINOPHEN 5-325 MG PO TABS: 1.0000 | ORAL_TABLET | ORAL | Status: DC | PRN

## 2013-01-06 MED ORDER — CYCLOBENZAPRINE HCL 10 MG PO TABS: 10.0000 mg | ORAL_TABLET | Freq: Two times a day (BID) | ORAL | Status: DC | PRN

## 2013-01-06 NOTE — ED Notes (Signed)
Patient reports that she continues to have pain to her neck after the recommended treatment that she was requested to try at her last visit. Patient reports that he pain is so bad that she can not lift her head up

## 2013-01-06 NOTE — ED Provider Notes (Signed)
History  Scribed for [name] Crumble, PA-C/[name], MD, the patient was seen in room WTR5/WTR5. This chart was scribed by Candelaria Stagers. The patient's care started at 7:25 PM   CSN: [id number]  Arrival date & time 01/06/13  4098   First MD Initiated Contact with Patient 01/06/13 1850      Chief Complaint  Patient presents with  . Neck Pain     The history is provided by the patient. No language interpreter was used.   [name] is a 52 y.o. female who presents to the Emergency Department complaining of continued pain and itching to the left side of neck that started after an insect bite about nine days ago.  Pt was seen in the ED and followed treatment plan.  She reports that the pain has not improved and was advised to return if pain persists.  Pt reports neck pain is better when lying flat.  Worsened with movement of the head and left shoulder. She denies fever, chills, vomiting, or diarrhea.  She has taken ibuprofen and vicodin with no relief.   Past Medical History  Diagnosis Date  . Asthma   . Hypertension   . Hyperlipidemia   . GERD (gastroesophageal reflux disease)   . Depression   . Aortic stenosis, moderate     per 11/2011 echo  . Blood transfusion     2013[facility]  . Shortness of breath   . Headache     h/o migraines  . Pulmonary embolism 02/08/12    S/P RT TOTAL KNEE ON 02/03/12--ON 02/08/12--DEVELOPED ACUTE SOB AND CHEST PAIN--AND DIAGNOSED WITH  PULMONARY EMBOLUS AND PNEUMONIA  . Restless leg syndrome   . Myocardial infarction     PT THINKS SHE WAS DX WITH MI AT THE TIME OF HEART STENTING  . CAD (coronary artery disease)     Cath 2010 with DES x 1 RCA-- PT'S CARDIOLOGIST IS DR. MCALHANY  . Uterine fibroid     NO PROBLEMS AT PRESENT FROM THE FIBROIDS-STATES SHE IS POST MENOPAUSAL-LAST MENSES 2010 EXCEPT FOR EPISODE THIS YR OF BLEEDING RELATED TO FIBROIDS.  Marland Kitchen Heart murmur   . Pneumonia 02/08/12    HOSPITALIZED AT [facility] WITH PNEUMONIA AND WITH  PULMONARY EMBOLUS  . Diabetes mellitus DIAGNOSED IN2010    ORAL MEDS  . Chronic kidney disease     PT HAS KIDNEY STONES PER XRAY--NO PROBLEMS AT PRESENT TIME FROM THE STONES; IN THE PAST SHE PASSED A STONE  . Arthritis     PAIN AND SEVERE OA LEFT KNEE ; S/P RIGHT TKA ON 02/03/12; HAS LOWER BACK PAIN-UNABLE TO STAND MORE THAN 10 MIN; ARTHRITIS "ALL OVER"  . Sleep apnea 09/03/12    STOP BANG SCORE OF 7  . Tingling     RIGHT ARM AND HAND  . Neck pain   . Weakness     BOTH HANDS - S/P BILATERAL CARPAL TUNNEL RELEASE--BUT STILL HAS WEAKNESS--OFTEN DROPS THINGS    Past Surgical History  Procedure Date  . Tubal ligation   . Carpal tunnel release     Bilateral  . Cholecystectomy   . Hernia repair   . Cardiac catheterization   . Knee arthroplasty 02/03/2012    Procedure: COMPUTER ASSISTED TOTAL KNEE ARTHROPLASTY;  Surgeon: [name], MD;  Location: [facility] OR;  Service: Orthopedics;  Laterality: Right;  Right total knee arthroplasty  . Coronary angioplasty     2010 has stent in place  . Total knee arthroplasty 09/10/2012  Procedure: TOTAL KNEE ARTHROPLASTY;  Surgeon: [name], MD;  Location: [facility] ORS;  Service: Orthopedics;  Laterality: Left;  . Trigger finger release 09/10/2012    Procedure: RELEASE TRIGGER FINGER/A-1 PULLEY;  Surgeon: [name], MD;  Location: [facility] ORS;  Service: Orthopedics;  Laterality: Right;  Right Ring Finger    Family History  Problem Relation Age of Onset  . Cancer Mother     Breast  . COPD Father   . Cancer Brother     Sinus    History  Substance Use Topics  . Smoking status: Former Smoker -- 1.5 packs/day for 30 years    Types: Cigarettes  . Smokeless tobacco: Not on file     Comment:  quit 12 -13 years ago  . Alcohol Use: No    OB History    Grav Para Term Preterm Abortions TAB SAB Ect Mult Living                  Review of Systems  Constitutional: Negative for fever and chills.  HENT: Positive for neck pain  (left sided neck pain). Negative for neck stiffness.   Respiratory: Negative for shortness of breath.   Cardiovascular: Negative for chest pain.  Gastrointestinal: Positive for nausea. Negative for vomiting and abdominal pain.  Skin: Positive for rash.  Neurological: Negative for dizziness, weakness and headaches.  All other systems reviewed and are negative.    Allergies  Review of patient's allergies indicates no known allergies.  Home Medications   Current Outpatient Rx  Name  Route  Sig  Dispense  Refill  . ALBUTEROL SULFATE HFA 108 (90 BASE) MCG/ACT IN AERS   Inhalation   Inhale 1 puff into the lungs every 6 (six) hours as needed. Shortness of breath.         . ASPIRIN EC 325 MG PO TBEC   Oral   Take 325 mg by mouth 2 (two) times daily.         . ATORVASTATIN CALCIUM 80 MG PO TABS   Oral   Take 1 tablet (80 mg total) by mouth at bedtime.   30 tablet   6   . CLOPIDOGREL BISULFATE 75 MG PO TABS   Oral   Take 75 mg by mouth every morning.          Marland Kitchen ESCITALOPRAM OXALATE 20 MG PO TABS   Oral   Take 20 mg by mouth daily with breakfast.          . EZETIMIBE 10 MG PO TABS   Oral   Take 10 mg by mouth every evening.          Marland Kitchen FLUTICASONE-SALMETEROL 250-50 MCG/DOSE IN AEPB   Inhalation   Inhale 1 puff into the lungs 2 (two) times daily.          . FUROSEMIDE 40 MG PO TABS   Oral   Take 1 tablet (40 mg total) by mouth 2 (two) times daily at 10 am and 4 pm.   60 tablet   0   . GABAPENTIN 300 MG PO CAPS   Oral   Take 300 mg by mouth 2 (two) times daily.          Marland Kitchen GLIPIZIDE 5 MG PO TABS   Oral   Take 5 mg by mouth 2 (two) times daily before a meal.          . HYDROCHLOROTHIAZIDE 25 MG PO TABS   Oral   Take 25 mg  by mouth daily with breakfast.          . HYDROCODONE-ACETAMINOPHEN 7.5-325 MG PO TABS   Oral   Take 1-2 tablets by mouth every 4 (four) hours as needed. pain         . IBUPROFEN 800 MG PO TABS   Oral   Take 1 tablet (800 mg  total) by mouth 3 (three) times daily.   21 tablet   0   . LOSARTAN POTASSIUM 100 MG PO TABS   Oral   Take 100 mg by mouth daily with breakfast.          . POTASSIUM CHLORIDE CRYS ER 20 MEQ PO TBCR   Oral   Take 2 tablets (40 mEq total) by mouth daily after breakfast.   30 tablet   0     BP 164/68  Pulse 90  Temp 98.7 F (37.1 C) (Oral)  Resp 18  SpO2 100%  Physical Exam  Nursing note and vitals reviewed. Constitutional: She is oriented to person, place, and time. She appears well-developed and well-nourished. No distress.       Morbidly obese  HENT:  Head: Normocephalic and atraumatic.  Eyes: EOM are normal.  Neck: Normal range of motion. Neck supple. No tracheal deviation present.       1x1 cm area red papule with no tenderness on left side of skull above ear.  No fluctuants.  No induration.   No rash.    Cardiovascular: Normal rate, regular rhythm and normal heart sounds.        Systolic murmur.    Pulmonary/Chest: Effort normal. No respiratory distress. She has no wheezes. She has no rales.       Decreased breath sounds.    Musculoskeletal: Normal range of motion.       Left trapezius tenderness from base of skull down her neck into left shoulder.  No midline tenderness.  Pain with head tilt to the left against resistance.  Pain with external rotation of left arm.  Full ROM of left arm at shoulder joint.  Good strength.     Lymphadenopathy:    She has no cervical adenopathy.  Neurological: She is alert and oriented to person, place, and time.  Skin: Skin is warm and dry. No rash noted.  Psychiatric: She has a normal mood and affect. Her behavior is normal.    ED Course  Procedures  DIAGNOSTIC STUDIES: Oxygen Saturation is 100% on room air, normal by my interpretation.    COORDINATION OF CARE: 7:33 PM Discussed course of care for muscle strain including stretching and heating pads.  Will prescribe pain medication to be used as needed and muscle relaxer.   Advised follow up with PCP.  Pt understands and agrees.      Labs Reviewed - No data to display No results found.   1. Trapezius muscle strain       MDM  Pt with left sided neck pain. States concerned of systemic poisoning from a spider bite. On my exam, unlikely. I think pt's neck pain is due to a musculoskeletal pain/spasms to the left trapezius. She is morbidly obese, and stats sleeping is uncomfortable, unable to find a pillow that is comfortable. Will try muscle relaxant, pain medication. Follow up.    I personally performed the services described in this documentation, which was scribed in my presence. The recorded information has been reviewed and is accurate.      Lottie Mussel, PA 01/07/13 [phone]

## 2013-01-07 NOTE — ED Provider Notes (Signed)
Medical screening examination/treatment/procedure(s) were performed by non-physician practitioner and as supervising physician I was immediately available for consultation/collaboration.   Edvin Albus L Etheleen Valtierra, MD 01/07/13 0154 

## 2013-01-11 ENCOUNTER — Ambulatory Visit (INDEPENDENT_AMBULATORY_CARE_PROVIDER_SITE_OTHER): Payer: Medicaid Other | Admitting: Pharmacist

## 2013-01-11 DIAGNOSIS — E785 Hyperlipidemia, unspecified: Secondary | ICD-10-CM

## 2013-01-11 NOTE — Assessment & Plan Note (Signed)
Reviewed pt's labs. TC- 162 (goal<200) improved from 214, TG- 109 (goal<150) slightly higher 74 previuosly, HDL- 40.3(goal>45) slightly lower 50 previous, LDL- 100 (goal<70) improved from 155. LFTs are WNL. Pt tolerating Lipitor 80mg  and the addition of Zetia 10mg  daily without porblems. She has improved her diet.  She does not exercise and uses a rollling chair to move around her house.  I have asked her to walk to mailbox ( approx 159ft) 2 x day.  She said she gets SOB with this activity and needs to stop 1/2 way.  I have encouraged her to implement exercise daily.  She is on Medicaid.   Lipitor is the highest potency statin on their preferred list. Pt was provided with more Zetia samples and we will need to complete PA through medicaid in order for it to be covered. Encouraged pt to continue smaller portion sizes and only eat 1 plate of food at each meal. She will follow up in Lipid Clinic in 3 months.

## 2013-01-11 NOTE — Progress Notes (Signed)
HPI  [name] is a 52 yo F who was referred to the lipid clinic by Dr. Clifton James.  She has a history of CAD, OSA, HTN, HLD, obesity, GERD, asthma, COPD, former tobacco abuse, and depression.  She has been treating her cholesterol with Lipitor for years and has had no problems tolerating it.  At her first Lipid Clinic appointment, Zetia 10mg  was added d/t LDL 155 on Lipitor 80mg  daily.  She states compliance to all medications.     Previously,  she would eat bacon or sausage, eggs, toast, and biscuits and gravy about 2 times per week.  The other days she will have cereal.  Lunch may be leftovers from the night before, sandwich, or her just snacking on "junk" such as cinnamon rolls or Cheetos and she makes dinner at home most nights; her husband does not like to go out to eat.  She does bake her meat majority of the time and likes potatoes, rice, green beans, pinto beans, etc as her side dishes.  She will have salads sometimes.  She admits to eating more than one plate majority of the time.  If she does go out, it is [employer], McDonald's, Wendy's, or [place].  She will eat desserts if they are in the house.  She snacks on fruit or trail mix if it is available.  She drinks coffee, juice, and seltzer water.   Since her first Lipid Clinic appt, she has made great strides in improving her diet. She has been eating 2 eggs with 1 piece of toast for breakfast without bacon or Honey Nut Cheerios.  She eats 2+ handfulls of almonds and a fruit and 1/2 PB sandwich for lunch and her evening meal is same as above but on a smaller plate and she only take one helping.    Pt admits that she is a "lazy person" and does not exercise at all.  She has a history of knee replacements but states her walking is limited to SOB  related to her weight and COPD more than anything else.  She stated walking into the clinic today was a challenge for her.  Whenever she goes to the store, she makes sure there is a motorized cart to ride  in or she will not stay at the store.    Current Outpatient Prescriptions on File Prior to Visit  Medication Sig Dispense Refill  . albuterol (PROVENTIL HFA;VENTOLIN HFA) 108 (90 BASE) MCG/ACT inhaler Inhale 1 puff into the lungs every 6 (six) hours as needed. Shortness of breath.      Marland Kitchen aspirin EC 325 MG tablet Take 325 mg by mouth 2 (two) times daily.      Marland Kitchen atorvastatin (LIPITOR) 80 MG tablet Take 1 tablet (80 mg total) by mouth at bedtime.  30 tablet  6  . clopidogrel (PLAVIX) 75 MG tablet Take 75 mg by mouth every morning.       . cyclobenzaprine (FLEXERIL) 10 MG tablet Take 1 tablet (10 mg total) by mouth 2 (two) times daily as needed for muscle spasms.  20 tablet  0  . escitalopram (LEXAPRO) 20 MG tablet Take 20 mg by mouth daily with breakfast.       . ezetimibe (ZETIA) 10 MG tablet Take 10 mg by mouth every evening.       . Fluticasone-Salmeterol (ADVAIR) 250-50 MCG/DOSE AEPB Inhale 1 puff into the lungs 2 (two) times daily.       . furosemide (LASIX) 40 MG tablet Take 1 tablet (  40 mg total) by mouth 2 (two) times daily at 10 am and 4 pm.  60 tablet  0  . gabapentin (NEURONTIN) 300 MG capsule Take 300 mg by mouth 2 (two) times daily.       Marland Kitchen glipiZIDE (GLUCOTROL) 5 MG tablet Take 5 mg by mouth 2 (two) times daily before a meal.       . hydrochlorothiazide (HYDRODIURIL) 25 MG tablet Take 25 mg by mouth daily with breakfast.       . HYDROcodone-acetaminophen (NORCO) 7.5-325 MG per tablet Take 1-2 tablets by mouth every 4 (four) hours as needed. pain      . ibuprofen (ADVIL,MOTRIN) 800 MG tablet Take 1 tablet (800 mg total) by mouth 3 (three) times daily.  21 tablet  0  . losartan (COZAAR) 100 MG tablet Take 100 mg by mouth daily with breakfast.       . oxyCODONE-acetaminophen (PERCOCET) 5-325 MG per tablet Take 1 tablet by mouth every 4 (four) hours as needed for pain.  10 tablet  0  . potassium chloride SA (K-DUR,KLOR-CON) 20 MEQ tablet Take 2 tablets (40 mEq total) by mouth daily after  breakfast.  30 tablet  0    No Known Allergies

## 2013-01-11 NOTE — Patient Instructions (Addendum)
Lipid panel looks much better 1.  Continue Lipitor 80mg  daily and Zetia 10mg  daily 2.  Continue small portioned meals 3.  Try to add small healthy snack in middle of the day 4.  Add exercise - roll chair around house 3 times a day and walk 1/2 way to mailbox and back 2 times a day 5.  Fasting blood work 1 week before appt 6.  Next Lipid Clinic appt Mon 05/09/13 at 3pm

## 2013-02-01 ENCOUNTER — Ambulatory Visit (HOSPITAL_COMMUNITY): Payer: Medicaid Other | Attending: Cardiovascular Disease | Admitting: Radiology

## 2013-02-01 DIAGNOSIS — E785 Hyperlipidemia, unspecified: Secondary | ICD-10-CM | POA: Insufficient documentation

## 2013-02-01 DIAGNOSIS — N189 Chronic kidney disease, unspecified: Secondary | ICD-10-CM | POA: Insufficient documentation

## 2013-02-01 DIAGNOSIS — K759 Inflammatory liver disease, unspecified: Secondary | ICD-10-CM | POA: Insufficient documentation

## 2013-02-01 DIAGNOSIS — I350 Nonrheumatic aortic (valve) stenosis: Secondary | ICD-10-CM

## 2013-02-01 DIAGNOSIS — I129 Hypertensive chronic kidney disease with stage 1 through stage 4 chronic kidney disease, or unspecified chronic kidney disease: Secondary | ICD-10-CM | POA: Insufficient documentation

## 2013-02-01 DIAGNOSIS — Z86711 Personal history of pulmonary embolism: Secondary | ICD-10-CM | POA: Insufficient documentation

## 2013-02-01 DIAGNOSIS — J449 Chronic obstructive pulmonary disease, unspecified: Secondary | ICD-10-CM | POA: Insufficient documentation

## 2013-02-01 DIAGNOSIS — J4489 Other specified chronic obstructive pulmonary disease: Secondary | ICD-10-CM | POA: Insufficient documentation

## 2013-02-01 DIAGNOSIS — I080 Rheumatic disorders of both mitral and aortic valves: Secondary | ICD-10-CM | POA: Insufficient documentation

## 2013-02-01 DIAGNOSIS — I359 Nonrheumatic aortic valve disorder, unspecified: Secondary | ICD-10-CM

## 2013-02-01 DIAGNOSIS — E119 Type 2 diabetes mellitus without complications: Secondary | ICD-10-CM | POA: Insufficient documentation

## 2013-02-01 DIAGNOSIS — G4733 Obstructive sleep apnea (adult) (pediatric): Secondary | ICD-10-CM | POA: Insufficient documentation

## 2013-02-01 NOTE — Progress Notes (Signed)
Echocardiogram performed.  

## 2013-02-11 ENCOUNTER — Ambulatory Visit: Payer: Medicaid Other | Admitting: Cardiovascular Disease

## 2013-02-15 ENCOUNTER — Telehealth: Payer: Self-pay | Admitting: Cardiovascular Disease

## 2013-02-15 NOTE — Telephone Encounter (Signed)
I will leave 4 weeks Zetia at front desk for her.  I have contacted Kennon Rounds regarding paperwork for [employer] [employer]

## 2013-02-15 NOTE — Telephone Encounter (Signed)
New Problem:    Patient called in wanting to receive more ezetimibe (ZETIA) 10 MG tablet because she has been out for two days.  Patient is unsure if she would be receiving more samples or if she would need a prescription.  Please call back.

## 2013-02-16 ENCOUNTER — Telehealth: Payer: Self-pay | Admitting: Cardiovascular Disease

## 2013-02-16 NOTE — Telephone Encounter (Signed)
Spoke with pt. Appt made to see Dr. Clifton James 02/17/13 at 11:45

## 2013-02-16 NOTE — Telephone Encounter (Signed)
Follow Up    Returning phone call from earlier.

## 2013-02-16 NOTE — Telephone Encounter (Signed)
New problem    Due to office close for two days  Her appt on  2/14 had to be reschedule  An appt is made on  3/24 @ 12:15 . Patient is concern this appt is not sooner enough. Offer an appt with PA . Patient refuse. Aware that message will be sent to nurse.

## 2013-02-16 NOTE — Telephone Encounter (Signed)
Left message to call back  

## 2013-02-17 ENCOUNTER — Ambulatory Visit (INDEPENDENT_AMBULATORY_CARE_PROVIDER_SITE_OTHER): Payer: Medicaid Other | Admitting: Cardiovascular Disease

## 2013-02-17 ENCOUNTER — Encounter: Payer: Self-pay | Admitting: Cardiovascular Disease

## 2013-02-17 VITALS — BP 140/60 | HR 78 | Ht 63.0 in | Wt 327.0 lb

## 2013-02-17 DIAGNOSIS — I359 Nonrheumatic aortic valve disorder, unspecified: Secondary | ICD-10-CM

## 2013-02-17 DIAGNOSIS — I2510 Atherosclerotic heart disease of native coronary artery without angina pectoris: Secondary | ICD-10-CM

## 2013-02-17 DIAGNOSIS — R0989 Other specified symptoms and signs involving the circulatory and respiratory systems: Secondary | ICD-10-CM

## 2013-02-17 DIAGNOSIS — I350 Nonrheumatic aortic (valve) stenosis: Secondary | ICD-10-CM

## 2013-02-17 DIAGNOSIS — R0609 Other forms of dyspnea: Secondary | ICD-10-CM

## 2013-02-17 DIAGNOSIS — I2782 Chronic pulmonary embolism: Secondary | ICD-10-CM

## 2013-02-17 LAB — BASIC METABOLIC PANEL
BUN: 19 mg/dL (ref 6–23)
Calcium: 9.2 mg/dL (ref 8.4–10.5)
Creatinine, Ser: 0.8 mg/dL (ref 0.4–1.2)
GFR: 78.46 mL/min (ref >60.00)
Glucose, Bld: 115 mg/dL — ABNORMAL HIGH (ref 70–99)

## 2013-02-17 NOTE — Progress Notes (Signed)
History of Present Illness: 52 yo WF with history of CAD, OSA, HTN, HLD, obesity, GERD, asthma, COPD, former tobacco abuse, depression here today for cardiac follow up. I saw her for the first time in December 2012. She has been seen in the past in [facility] and Vascular. She had a cardiac cath in February 2010 and had a drug eluting stent (2.33mm Xience) placed in the distal RCA per Dr. Elsie Lincoln. Her LAD and Circumflex were free of disease. She had her right knee replaced in February 8,2013. She developed SOB on 02/08/12 and was found to have a PE and pneumonia. She was started on coumadin and treated for 6 months.  CTA chest on 08/09/12 showed no evidence of PE so her coumadin was stopped. I last saw her August 2013. Her lipids are followed in lipid clinic.   She is here today for cardiac follow up. She has had some chest pains, mostly at rest. Her breathing has gotten worse. She reports dyspnea with minimal exertion. Her weight has been stable. She does not smoke. She denies syncope.  Overall feeling poorly.  .  Primary Care Physician: Della Goo   Last Lipid Profile:Lipid Panel     Component Value Date/Time   CHOL 162 01/05/2013 0918   TRIG 109.0 01/05/2013 0918   HDL 40.30 01/05/2013 0918   CHOLHDL 4 01/05/2013 0918   VLDL 21.8 01/05/2013 0918   LDLCALC 100* 01/05/2013 0918     Past Medical History  Diagnosis Date  . Asthma   . Hypertension   . Hyperlipidemia   . GERD (gastroesophageal reflux disease)   . Depression   . Aortic stenosis, moderate     per 11/2011 echo  . Blood transfusion     2013[facility]  . Shortness of breath   . Headache     h/o migraines  . Pulmonary embolism 02/08/12    S/P RT TOTAL KNEE ON 02/03/12--ON 02/08/12--DEVELOPED ACUTE SOB AND CHEST PAIN--AND DIAGNOSED WITH  PULMONARY EMBOLUS AND PNEUMONIA  . Restless leg syndrome   . Myocardial infarction     PT THINKS SHE WAS DX WITH MI AT THE TIME OF HEART STENTING  . CAD (coronary artery disease)     Cath 2010  with DES x 1 RCA-- PT'S CARDIOLOGIST IS DR. Reginald Mangels  . Uterine fibroid     NO PROBLEMS AT PRESENT FROM THE FIBROIDS-STATES SHE IS POST MENOPAUSAL-LAST MENSES 2010 EXCEPT FOR EPISODE THIS YR OF BLEEDING RELATED TO FIBROIDS.  Marland Kitchen Heart murmur   . Pneumonia 02/08/12    HOSPITALIZED AT [facility] WITH PNEUMONIA AND WITH PULMONARY EMBOLUS  . Diabetes mellitus DIAGNOSED IN2010    ORAL MEDS  . Chronic kidney disease     PT HAS KIDNEY STONES PER XRAY--NO PROBLEMS AT PRESENT TIME FROM THE STONES; IN THE PAST SHE PASSED A STONE  . Arthritis     PAIN AND SEVERE OA LEFT KNEE ; S/P RIGHT TKA ON 02/03/12; HAS LOWER BACK PAIN-UNABLE TO STAND MORE THAN 10 MIN; ARTHRITIS "ALL OVER"  . Sleep apnea 09/03/12    STOP BANG SCORE OF 7  . Tingling     RIGHT ARM AND HAND  . Neck pain   . Weakness     BOTH HANDS - S/P BILATERAL CARPAL TUNNEL RELEASE--BUT STILL HAS WEAKNESS--OFTEN DROPS THINGS    Past Surgical History  Procedure Laterality Date  . Tubal ligation    . Carpal tunnel release      Bilateral  . Cholecystectomy    .  Hernia repair    . Cardiac catheterization    . Knee arthroplasty  02/03/2012    Procedure: COMPUTER ASSISTED TOTAL KNEE ARTHROPLASTY;  Surgeon: [name], MD;  Location: [facility];  Service: Orthopedics;  Laterality: Right;  Right total knee arthroplasty  . Coronary angioplasty      2010 has stent in place  . Total knee arthroplasty  09/10/2012    Procedure: TOTAL KNEE ARTHROPLASTY;  Surgeon: [name], MD;  Location: [facility] ORS;  Service: Orthopedics;  Laterality: Left;  . Trigger finger release  09/10/2012    Procedure: RELEASE TRIGGER FINGER/A-1 PULLEY;  Surgeon: [name], MD;  Location: [facility] ORS;  Service: Orthopedics;  Laterality: Right;  Right Ring Finger    Current Outpatient Prescriptions  Medication Sig Dispense Refill  . albuterol (PROVENTIL HFA;VENTOLIN HFA) 108 (90 BASE) MCG/ACT inhaler Inhale 1 puff into the lungs every 6 (six) hours as needed.  Shortness of breath.      Marland Kitchen aspirin EC 325 MG tablet Take 325 mg by mouth 2 (two) times daily.      Marland Kitchen atorvastatin (LIPITOR) 80 MG tablet Take 1 tablet (80 mg total) by mouth at bedtime.  30 tablet  6  . clopidogrel (PLAVIX) 75 MG tablet Take 75 mg by mouth every morning.       . cyclobenzaprine (FLEXERIL) 10 MG tablet Take 1 tablet (10 mg total) by mouth 2 (two) times daily as needed for muscle spasms.  20 tablet  0  . escitalopram (LEXAPRO) 20 MG tablet Take 20 mg by mouth daily with breakfast.       . ezetimibe (ZETIA) 10 MG tablet Take 10 mg by mouth every evening.       . Fluticasone-Salmeterol (ADVAIR) 250-50 MCG/DOSE AEPB Inhale 1 puff into the lungs 2 (two) times daily.       . furosemide (LASIX) 40 MG tablet Take 1 tablet (40 mg total) by mouth 2 (two) times daily at 10 am and 4 pm.  60 tablet  0  . gabapentin (NEURONTIN) 300 MG capsule Take 300 mg by mouth 2 (two) times daily.       Marland Kitchen glipiZIDE (GLUCOTROL) 5 MG tablet Take 5 mg by mouth 2 (two) times daily before a meal.       . hydrochlorothiazide (HYDRODIURIL) 25 MG tablet Take 25 mg by mouth daily with breakfast.       . HYDROcodone-acetaminophen (NORCO) 7.5-325 MG per tablet Take 1-2 tablets by mouth every 4 (four) hours as needed. pain      . ibuprofen (ADVIL,MOTRIN) 800 MG tablet Take 1 tablet (800 mg total) by mouth 3 (three) times daily.  21 tablet  0  . losartan (COZAAR) 100 MG tablet Take 100 mg by mouth daily with breakfast.       . oxyCODONE-acetaminophen (PERCOCET) 5-325 MG per tablet Take 1 tablet by mouth every 4 (four) hours as needed for pain.  10 tablet  0  . potassium chloride SA (K-DUR,KLOR-CON) 20 MEQ tablet Take 2 tablets (40 mEq total) by mouth daily after breakfast.  30 tablet  0   No current facility-administered medications for this visit.    No Known Allergies  History   Social History  . Marital Status: Married    Spouse Name: N/A    Number of Children: 2  . Years of Education: N/A   Occupational  History  . Disabled    Social History Main Topics  . Smoking status: Former Smoker -- 1.50  packs/day for 30 years    Types: Cigarettes  . Smokeless tobacco: Not on file     Comment:  quit 12 -13 years ago  . Alcohol Use: No  . Drug Use: No  . Sexually Active: Yes    Birth Control/ Protection: Surgical   Other Topics Concern  . Not on file   Social History Narrative  . No narrative on file    Family History  Problem Relation Age of Onset  . Cancer Mother     Breast  . COPD Father   . Cancer Brother     Sinus    Review of Systems:  As stated in the HPI and otherwise negative.   BP 140/60  Pulse 78  Ht 5\' 3"  (1.6 m)  Wt 327 lb (148.326 kg)  BMI 57.94 kg/m2  Physical Examination: General: Well developed, well nourished, NAD HEENT: OP clear, mucus membranes moist SKIN: warm, dry. No rashes. Neuro: No focal deficits Musculoskeletal: Muscle strength 5/5 all ext Psychiatric: Mood and affect normal Neck: No JVD, no carotid bruits, no thyromegaly, no lymphadenopathy. Lungs:Clear bilaterally, no wheezes, rhonci, crackles Cardiovascular: Regular rate and rhythm. No murmurs, gallops or rubs. Abdomen:Soft. Bowel sounds present. Non-tender.  Extremities: No lower extremity edema. Pulses are 2 + in the bilateral DP/PT.  CT chest 08/09/12: 1. No evidence of acute or chronic pulmonary embolism to the level of the bilateral subsegmental pulmonary arteries. 2. Interval resolution of extensive bilateral perihilar, heterogeneous and solid opacities suggest resolved infection. 3. Borderline coronary cardiomegaly with coronary artery and aortic valvular calcifications. Further evaluation cardiac echo may be normal as clinically indicated. 4. Unchanged fluid density lesion within the anterior mediastinum, stable since 2010 examination and again favored to represent either a pericardial or thymic cyst.   Echo 02/01/13:  Left ventricle: The cavity size was normal. Wall  thickness was increased in a pattern of mild LVH. Systolic function was normal. The estimated ejection fraction was in the range of 60% to 65%. Wall motion was normal; there were no regional wall motion abnormalities. Doppler parameters are consistent with abnormal left ventricular relaxation (grade 1 diastolic dysfunction). - Aortic valve: There was severe stenosis. Trivial regurgitation. - Mitral valve: Calcified annulus.  Assessment and Plan:   1. Dyspnea: Likely multi-factorial. This could be related to her obesity, deconditioning, aortic stenosis, CAD, asthma, PE.   2. CAD: Some chest discomfort but not exertional.  Will continue ASA, Plavix, statin and ARB. No beta blocker secondary to asthma/COPD. BP well controlled. Will arrange Lexiscan myoview to exclude ischemia. She does need to have knee surgery in March. Will review findings from stress test and then make further recs on right/left heart cath.   3. Aortic valve stenosis: Severe by echo 02/01/13. Normal LV function. This could be contributing to her dyspnea. Will need to follow closely. May need TEE to better define the aortic stenosis.   4. H/O Pulmonary embolism: No residual PE on CTA 08/09/12. She has been off of coumadin. Now with worsened dyspnea, will need to exclude PE. Will arrange repeat chest CTA today.

## 2013-02-17 NOTE — Patient Instructions (Addendum)
Your physician recommends that you schedule a follow-up appointment  The first week of March 2014  Non-Cardiac CT Angiography (CTA), is a special type of CT scan that uses a computer to produce multi-dimensional views of major blood vessels throughout the body. In CT angiography, a contrast material is injected through an IV to help visualize the blood vessels  Your physician has requested that you have a lexiscan myoview. For further information please visit [email or web address] Please follow instruction sheet, as given.

## 2013-02-21 ENCOUNTER — Ambulatory Visit (INDEPENDENT_AMBULATORY_CARE_PROVIDER_SITE_OTHER)
Admission: RE | Admit: 2013-02-21 | Discharge: 2013-02-21 | Disposition: A | Discharge status: Home / Self Care | Payer: Medicaid Other | Source: Ambulatory Visit | Attending: Cardiovascular Disease | Admitting: Cardiovascular Disease

## 2013-02-21 DIAGNOSIS — R0609 Other forms of dyspnea: Secondary | ICD-10-CM

## 2013-02-21 DIAGNOSIS — R0989 Other specified symptoms and signs involving the circulatory and respiratory systems: Secondary | ICD-10-CM

## 2013-02-21 IMAGING — CT CT ANGIO CHEST
2 of 6 series · 18 of 36 positions shown · IV contrast (Omnipaque 300)
Comparison: [DATE]

CLINICAL DATA: Increased shortness of breath x4-5 weeks, history of
PE

CT ANGIOGRAPHY CHEST
TECHNIQUE: Multidetector CT imaging of the chest using the
standard protocol during bolus administration of intravenous
contrast. Multiplanar reconstructed images including MIPs were
obtained and reviewed to evaluate the vascular anatomy.
Contrast: 80mL OMNIPAQUE IOHEXOL 350 MG/ML SOLN

[Series 5: thins (id) / (id) · axial · 0.75mm/px · z∈[-298,-70]mm · 17 of 254 slices shown]
[im 13/254  lung]
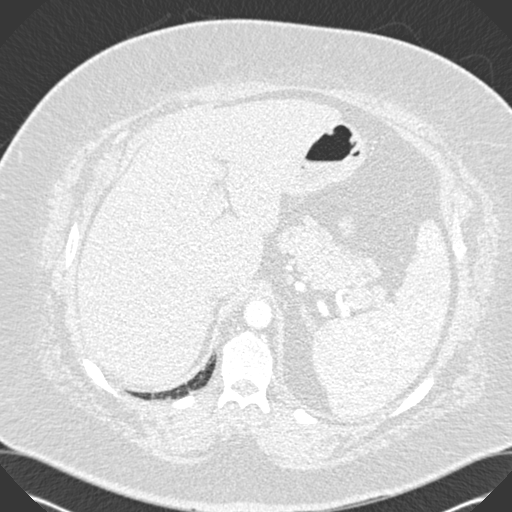
[im 26/254  mediastinal]
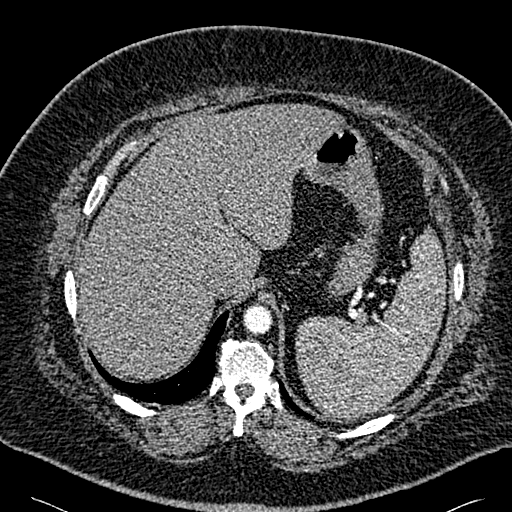
[im 38/254  lung]
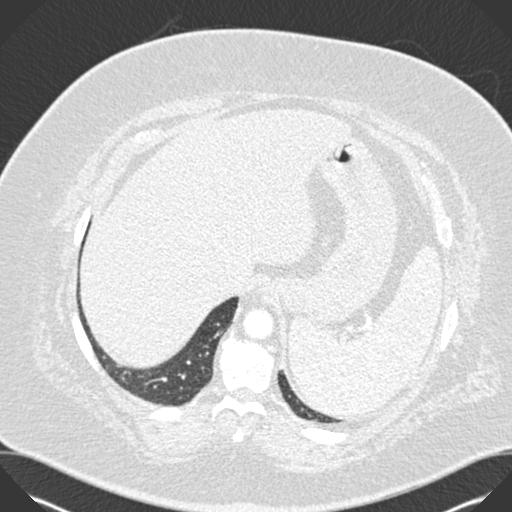
[im 51/254  mediastinal]
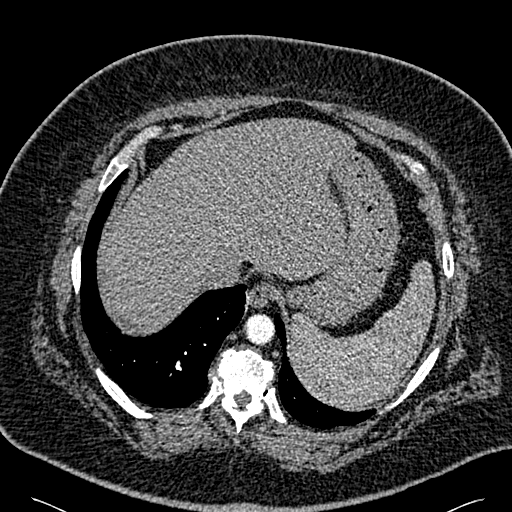
[im 76/254  lung]
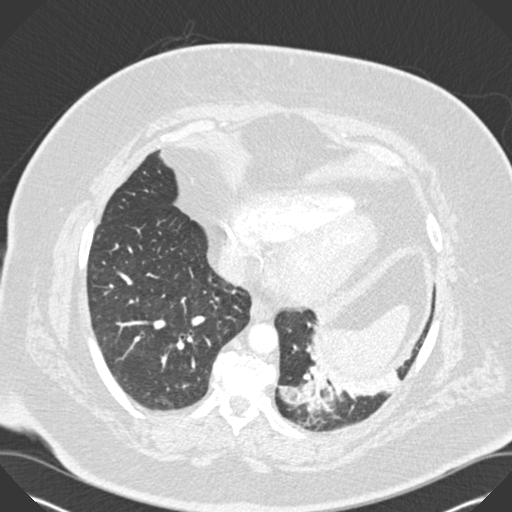
[im 89/254  mediastinal]
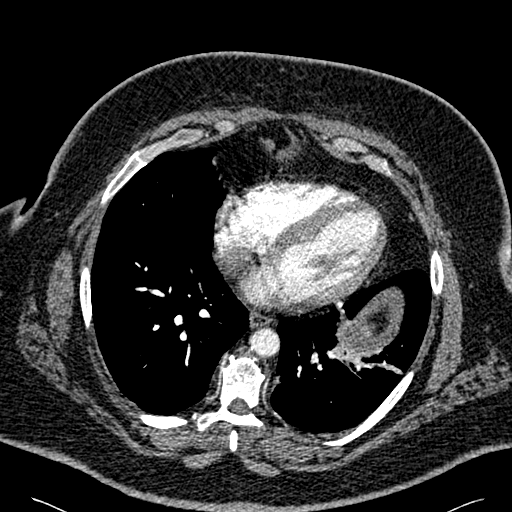
[im 102/254  lung]
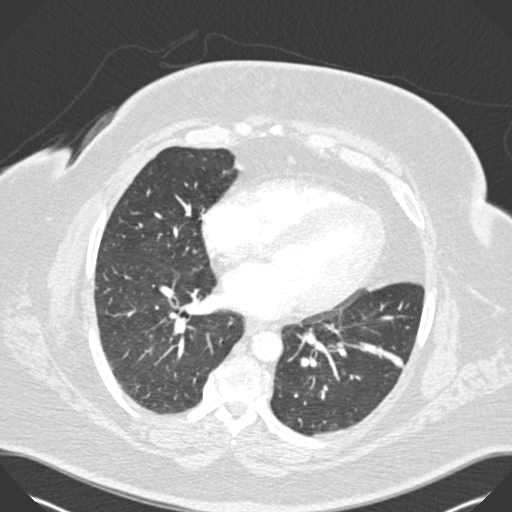
[im 114/254  mediastinal]
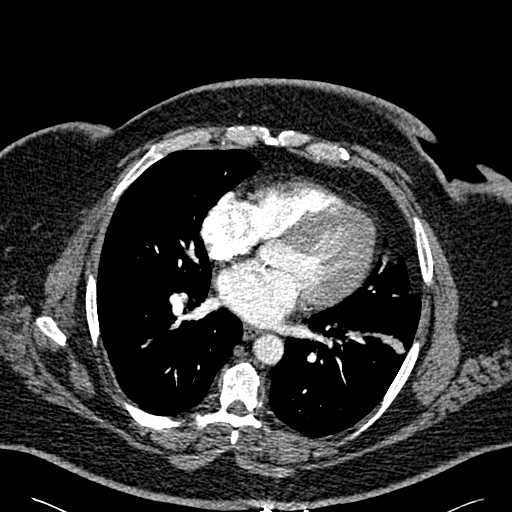
[im 127/254  lung]
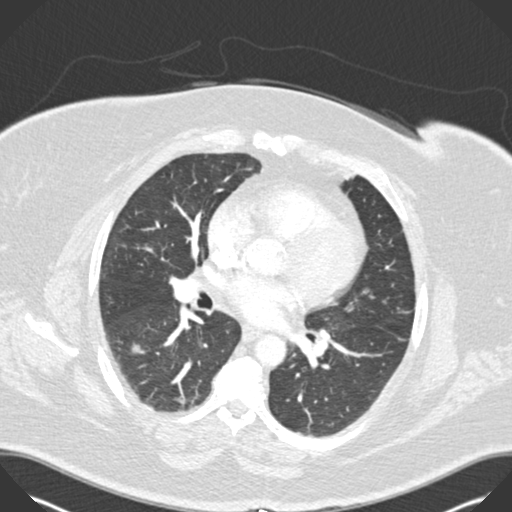
[im 140/254  mediastinal]
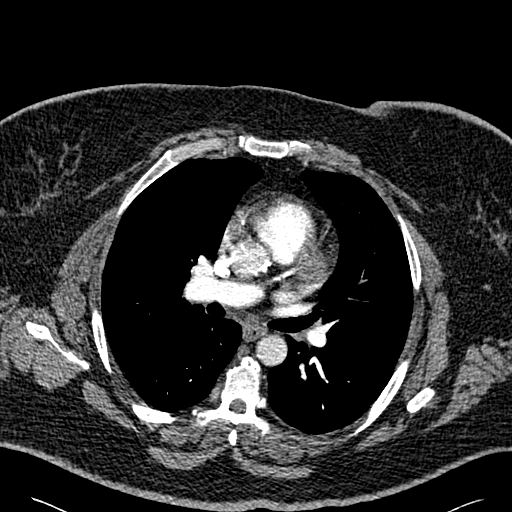
[im 152/254  lung]
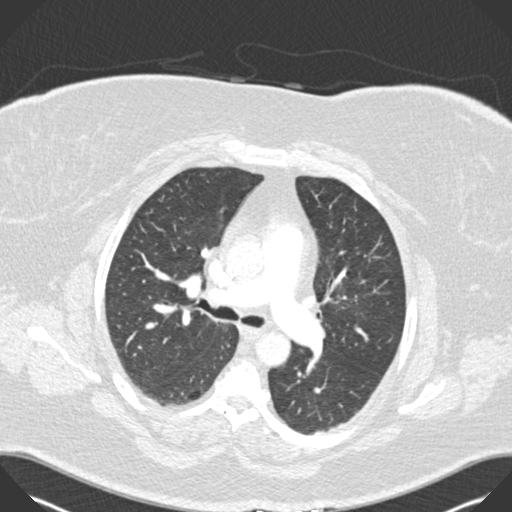
[im 165/254  mediastinal]
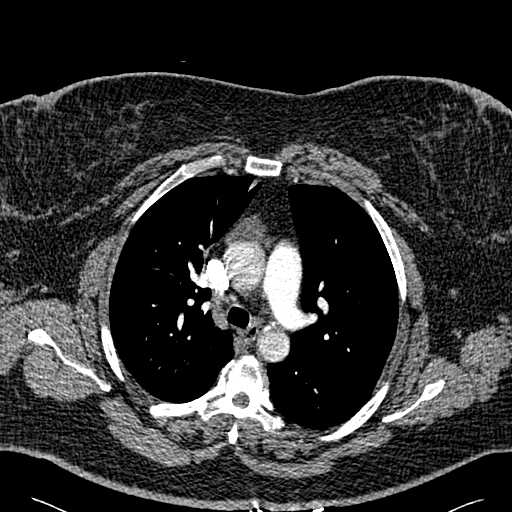
[im 178/254  lung]
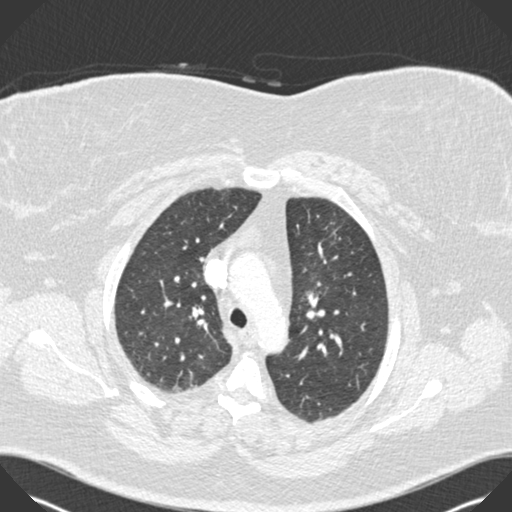
[im 203/254  mediastinal]
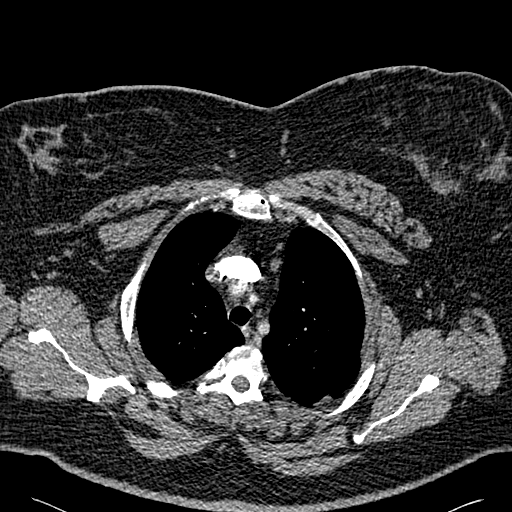
[im 216/254  lung]
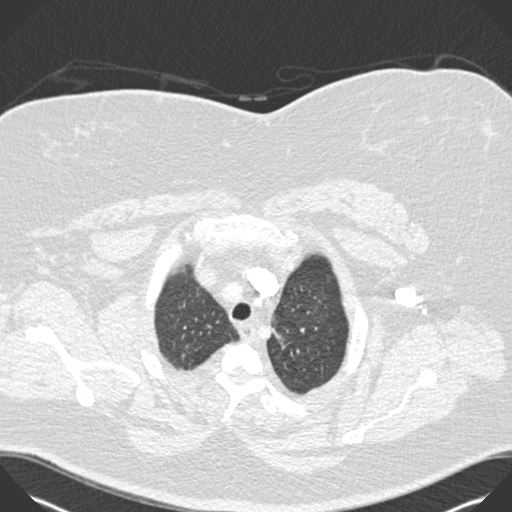
[im 228/254  mediastinal]
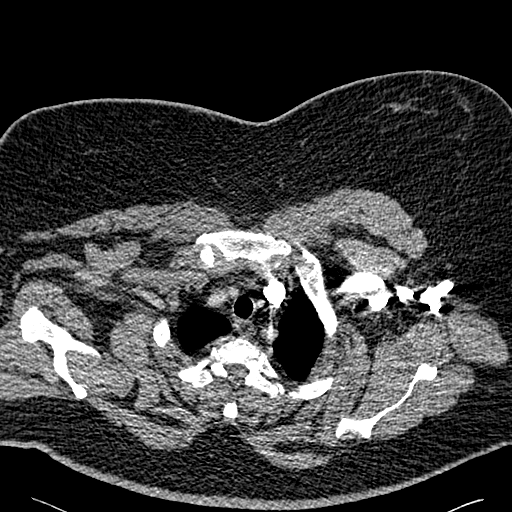
[im 241/254  lung]
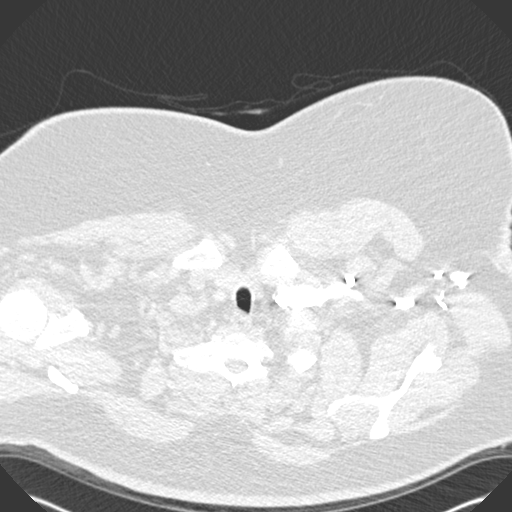

[Series 602: cor mpr · coronal · 0.75mm/px · 1 of 117 slices shown]
[im 59/117  mediastinal]
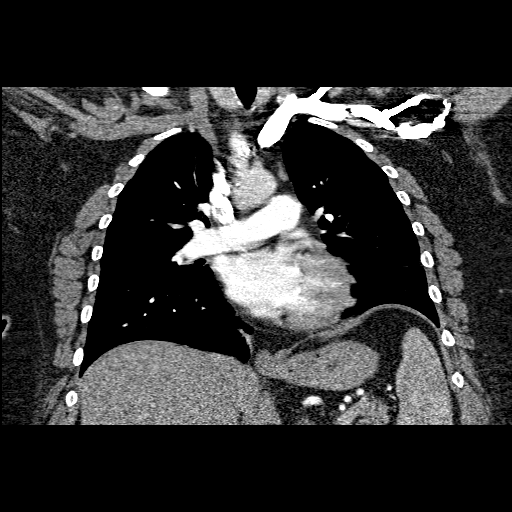

[18 of 36 positions shown; findings below may reference images not displayed]

FINDINGS: No evidence of pulmonary embolism.

8 mm patchy/nodular opacity in the superior segment right lower
lobe (series 6/image 41), new.

Additional mild subpleural opacity in the posterior right upper
lobe (series 6/image 24), lateral right middle lobe (series 6/image
41), and right lower lobe (series 6/image 45).  The overall
appearance is favored to be infectious/inflammatory.

Mild linear scarring/atelectasis in the lingula and left lower
lobe.

Mild emphysematous changes. No pleural effusion or pneumothorax.

Visualized thyroid is unremarkable.

The heart is normal in size.  No pericardial effusion.
Atherosclerotic calcifications of the aortic arch.

Stable fluid density lesion in the anterior mediastinum (series
4/image 28), possibly reflecting a superior pericardial recess or
pericardial cyst.

Small mediastinal lymph nodes which do not meet pathologic CT size
criteria.  No suspicious hilar or axillary lymphadenopathy.

Visualized upper abdomen is grossly unremarkable.

Degenerative changes of the visualized thoracolumbar spine.
IMPRESSION: No evidence of pulmonary embolism.

8 mm patchy/nodular opacity in the superior segment right lower
lobe, favored to be infectious/inflammatory.  Follow-up CT chest is
suggested in 3 months to assess for persistence.

Very mild additional infectious/inflammatory opacities in the right
lung, as described above.

This recommendation follows the consensus statement: Guidelines for
Management of Small Pulmonary Nodules Detected on CT Scans:  A
Statement from the [HOSPITAL] as published in Radiology

## 2013-02-21 MED ORDER — IOHEXOL 350 MG/ML SOLN
80.0000 mL | Freq: Once | INTRAVENOUS | Status: AC | PRN
  Administered 2013-02-21: 80 mL via INTRAVENOUS

## 2013-02-28 ENCOUNTER — Ambulatory Visit (HOSPITAL_COMMUNITY): Payer: Medicaid Other | Attending: Cardiology | Admitting: Radiology

## 2013-02-28 ENCOUNTER — Telehealth: Payer: Self-pay | Admitting: Cardiovascular Disease

## 2013-02-28 VITALS — BP 105/63 | Ht 63.0 in | Wt 327.0 lb

## 2013-02-28 DIAGNOSIS — R0609 Other forms of dyspnea: Secondary | ICD-10-CM | POA: Insufficient documentation

## 2013-02-28 DIAGNOSIS — I2510 Atherosclerotic heart disease of native coronary artery without angina pectoris: Secondary | ICD-10-CM

## 2013-02-28 DIAGNOSIS — R079 Chest pain, unspecified: Secondary | ICD-10-CM | POA: Insufficient documentation

## 2013-02-28 DIAGNOSIS — I10 Essential (primary) hypertension: Secondary | ICD-10-CM | POA: Insufficient documentation

## 2013-02-28 DIAGNOSIS — R0602 Shortness of breath: Secondary | ICD-10-CM

## 2013-02-28 DIAGNOSIS — J45909 Unspecified asthma, uncomplicated: Secondary | ICD-10-CM | POA: Insufficient documentation

## 2013-02-28 DIAGNOSIS — E119 Type 2 diabetes mellitus without complications: Secondary | ICD-10-CM | POA: Insufficient documentation

## 2013-02-28 DIAGNOSIS — R0989 Other specified symptoms and signs involving the circulatory and respiratory systems: Secondary | ICD-10-CM | POA: Insufficient documentation

## 2013-02-28 MED ORDER — TECHNETIUM TC 99M SESTAMIBI GENERIC - CARDIOLITE
30.0000 | Freq: Once | INTRAVENOUS | Status: AC | PRN
  Administered 2013-02-28: 30 via INTRAVENOUS

## 2013-02-28 MED ORDER — REGADENOSON 0.4 MG/5ML IV SOLN
0.4000 mg | Freq: Once | INTRAVENOUS | Status: AC
  Administered 2013-02-28: 0.4 mg via INTRAVENOUS

## 2013-02-28 NOTE — Telephone Encounter (Signed)
New Prob    Pending upcoming appt. Would like to know if pt will need antibiotics prior to getting her tooth pulled and if she can be taken off blood thinners 3 days prior/alternative treatment. Would like to speak to nurse.

## 2013-02-28 NOTE — Telephone Encounter (Signed)
Spoke with [place] and gave her information from Dr. Clifton James.  Christy then asked about antibiotics due to knee replacement.  I told her to contact pt's surgeon if questions regarding knee replacement but that from a cardiac standpoint pt did not need antibiotics.

## 2013-02-28 NOTE — Progress Notes (Signed)
[facility] SITE 3 NUCLEAR MED [address] [place], [place] [address] [phone]    Cardiology Nuclear Med Study  [name] is a 52 y.o. female     MRN : [id number]     DOB: Oct 13, 1961  Procedure Date: 02/28/2013  Nuclear Med Background Indication for Stress Test:  Evaluation for Ischemia and Stent Patency History:  Asthma, COPD and 01/2009 MI-Heart Cath-stent to distal RCA LAD/CFX free of DZ, Cardiac Risk Factors: History of Smoking, Hypertension, Lipids and NIDDM  Symptoms:  Chest Pain, DOE and SOB   Nuclear Pre-Procedure Caffeine/Decaff Intake:  None NPO After: 8:00pm   Lungs:  clear O2 Sat: 94% on room air. IV 0.9% NS with Angio Cath:  24g  IV Site: R Antecubital  IV Started by:  Milana Na, EMT-P  Chest Size (in):  38 Cup Size: DD  Height: 5\' 3"  (1.6 m)  Weight:  327 lb (148.326 kg)  BMI:  Body mass index is 57.94 kg/(m^2). Tech Comments:  CBG 122 mg/dl No Rx this am    Nuclear Med Study 1 or 2 day study: 2 day  Stress Test Type:  Eugenie Birks  Reading MD: Willa Rough, MD  Order Authorizing [name]:  C.McAlhany MD  Resting Radionuclide: Technetium 31m Sestamibi  Resting Radionuclide Dose: 33.0 mCi on 03/02/13   Stress Radionuclide:  Technetium 34m Sestamibi  Stress Radionuclide Dose: 33.0 mCi on 02/28/13           Stress Protocol Rest HR: 86 Stress HR: 106  Rest BP: 105/63 Stress BP: 121/66  Exercise Time (min): n/a METS: n/a   Predicted Max HR: 167 bpm % Max HR: 63.47 bpm Rate Pressure Product: 21308   Dose of Adenosine (mg):  n/a Dose of Lexiscan: 0.4 mg  Dose of Atropine (mg): n/a Dose of Dobutamine: n/a mcg/kg/min (at max HR)  Stress Test Technologist: Milana Na, EMT-P  Nuclear Technologist:  Domenic Polite, CNMT     Rest Procedure:  Myocardial perfusion imaging was performed at rest 45 minutes following the intravenous administration of Technetium 17m Sestamibi. Rest ECG: NSR - Normal EKG  Stress Procedure:  The patient  received IV Lexiscan 0.4 mg over 15-seconds.  Technetium 55m Sestamibi injected at 30-seconds.This patent had sob, lt. Headedness, and chest pain with the Lexiscan infusion.  Quantitative spect images were obtained after a 45 minute delay. Stress ECG: No significant change from baseline ECG  QPS Raw Data Images:  There is interference from subdiaphragmatic activity.  Stress Images:  There is a small, mild mid to apical inferolateral perfusion defect.  Rest Images:  Normal homogeneous uptake in all areas of the myocardium. Subtraction (SDS):  Reversible small, mild mid to apical inferolateral perfusion defect.  Transient Ischemic Dilatation (Normal <1.22):  0.90 Lung/Heart Ratio (Normal <0.45):  0.24  Quantitative Gated Spect Images QGS EDV:  90 ml QGS ESV:  32 ml  Impression Exercise Capacity:  Lexiscan with no exercise. BP Response:  Normal blood pressure response. Clinical Symptoms:  Chest pain ECG Impression:  No significant ST segment change suggestive of ischemia. Comparison with Prior Nuclear Study: No images to compare  Overall Impression:  Low risk stress nuclear study. There is a reversible small, mild mid to apical inferolateral perfusion defect.  There is interference from below the diaphragm so this may be artifact.  However, cannot rule out a small area of ischemia.   LV Ejection Fraction: 64%.  LV Wall Motion:  NL LV Function; NL Wall Motion  [employer]  03/02/2013           

## 2013-02-28 NOTE — Telephone Encounter (Signed)
Spoke with Neysa Bonito at Dr. Dyanne Iha office. Pt was seen in their office for first time last week.  She needs to have tooth pulled so she can have partials made for back teeth.  Office is asking if pt needs antibiotics prior to procedure and if Plavix can be stopped 3 days prior to tooth being pulled.  Per Neysa Bonito ASA does not need to be stopped.

## 2013-02-28 NOTE — Telephone Encounter (Signed)
Ok to hold Plavix for procedure. She does not need antibiotics. cdm

## 2013-03-02 ENCOUNTER — Ambulatory Visit (HOSPITAL_COMMUNITY): Payer: Medicaid Other | Attending: Cardiology | Admitting: Radiology

## 2013-03-02 DIAGNOSIS — R0989 Other specified symptoms and signs involving the circulatory and respiratory systems: Secondary | ICD-10-CM

## 2013-03-02 MED ORDER — TECHNETIUM TC 99M SESTAMIBI GENERIC - CARDIOLITE
33.0000 | Freq: Once | INTRAVENOUS | Status: AC | PRN
  Administered 2013-03-02: 33 via INTRAVENOUS

## 2013-03-04 ENCOUNTER — Ambulatory Visit: Payer: Medicaid Other | Admitting: Cardiovascular Disease

## 2013-03-07 ENCOUNTER — Telehealth: Payer: Self-pay | Admitting: Cardiovascular Disease

## 2013-03-07 NOTE — Telephone Encounter (Signed)
Reviewed with Dr. Clifton James and pt needs office visit this week to discuss stress test results and possible cath.  I have scheduled pt for office visit with Dr. Clifton James on March 09, 2013 at 3:00. I tried again to reach pt to give her this information but was unable to reach her. Left message to call back. Previous phone number in prior telephone notes has been disconnected.

## 2013-03-07 NOTE — Telephone Encounter (Signed)
Left message to call back on cell number. Number listed above as call back number has been disconnected.

## 2013-03-07 NOTE — Telephone Encounter (Signed)
New Problem:    Patient called in wanting to speak with you about how to proceed with her care since her appointment was canceled on 03/04/13 due to the weather.  Please call back.

## 2013-03-09 ENCOUNTER — Encounter: Payer: Self-pay | Admitting: Cardiovascular Disease

## 2013-03-09 ENCOUNTER — Encounter: Payer: Self-pay | Admitting: *Deleted

## 2013-03-09 ENCOUNTER — Ambulatory Visit (INDEPENDENT_AMBULATORY_CARE_PROVIDER_SITE_OTHER): Payer: Medicaid Other | Admitting: Cardiovascular Disease

## 2013-03-09 VITALS — BP 129/70 | HR 80 | Ht 63.0 in | Wt 330.0 lb

## 2013-03-09 DIAGNOSIS — R0989 Other specified symptoms and signs involving the circulatory and respiratory systems: Secondary | ICD-10-CM

## 2013-03-09 DIAGNOSIS — I350 Nonrheumatic aortic (valve) stenosis: Secondary | ICD-10-CM

## 2013-03-09 DIAGNOSIS — I2510 Atherosclerotic heart disease of native coronary artery without angina pectoris: Secondary | ICD-10-CM

## 2013-03-09 DIAGNOSIS — I359 Nonrheumatic aortic valve disorder, unspecified: Secondary | ICD-10-CM

## 2013-03-09 DIAGNOSIS — R0600 Dyspnea, unspecified: Secondary | ICD-10-CM

## 2013-03-09 DIAGNOSIS — R0609 Other forms of dyspnea: Secondary | ICD-10-CM

## 2013-03-09 NOTE — Progress Notes (Signed)
History of Present Illness: 52 yo WF with history of CAD, OSA, HTN, HLD, obesity, GERD, asthma, COPD, former tobacco abuse, depression here today for cardiac follow up. She has been followed in the past in [facility]. I saw her for the first time in December 2012.She had a cardiac cath in February 2010 and had a drug eluting stent (2.23mm Xience) placed in the distal RCA per Dr. Elsie Lincoln. Her LAD and Circumflex were free of disease. She had her right knee replaced in February 8,2013. She developed SOB on 02/08/12 and was found to have a PE and pneumonia. She was started on coumadin and treated for 6 months. CTA chest on 08/09/12 showed no evidence of PE so her coumadin was stopped. I last saw her February 20,2014 and she c/o severe dyspnea with minimal exertion. I felt that this was multi-factorial. Chest CTA with no PE.  I arranged a stress myoview which showed possible ischemia. Echo with severe AS, normal LV function.   She is here today for cardiac follow up. She has had some chest pressure, mostly at rest. Her breathing is poor. Dyspnea with minimal exertion. Her weight has been stable. She does not smoke. She denies syncope. Overall feeling poorly.  .  Primary Care Physician: Della Goo   Last Lipid Profile:Lipid Panel     Component Value Date/Time   CHOL 162 01/05/2013 0918   TRIG 109.0 01/05/2013 0918   HDL 40.30 01/05/2013 0918   CHOLHDL 4 01/05/2013 0918   VLDL 21.8 01/05/2013 0918   LDLCALC 100* 01/05/2013 0918     Past Medical History  Diagnosis Date  . Asthma   . Hypertension   . Hyperlipidemia   . GERD (gastroesophageal reflux disease)   . Depression   . Aortic stenosis, moderate     per 11/2011 echo  . Blood transfusion     2013[facility]  . Shortness of breath   . Headache     h/o migraines  . Pulmonary embolism 02/08/12    S/P RT TOTAL KNEE ON 02/03/12--ON 02/08/12--DEVELOPED ACUTE SOB AND CHEST PAIN--AND DIAGNOSED WITH  PULMONARY EMBOLUS AND PNEUMONIA  .  Restless leg syndrome   . Myocardial infarction     PT THINKS SHE WAS DX WITH MI AT THE TIME OF HEART STENTING  . CAD (coronary artery disease)     Cath 2010 with DES x 1 RCA-- PT'S CARDIOLOGIST IS DR. MCALHANY  . Uterine fibroid     NO PROBLEMS AT PRESENT FROM THE FIBROIDS-STATES SHE IS POST MENOPAUSAL-LAST MENSES 2010 EXCEPT FOR EPISODE THIS YR OF BLEEDING RELATED TO FIBROIDS.  Marland Kitchen Heart murmur   . Pneumonia 02/08/12    HOSPITALIZED AT [facility] WITH PNEUMONIA AND WITH PULMONARY EMBOLUS  . Diabetes mellitus DIAGNOSED IN2010    ORAL MEDS  . Chronic kidney disease     PT HAS KIDNEY STONES PER XRAY--NO PROBLEMS AT PRESENT TIME FROM THE STONES; IN THE PAST SHE PASSED A STONE  . Arthritis     PAIN AND SEVERE OA LEFT KNEE ; S/P RIGHT TKA ON 02/03/12; HAS LOWER BACK PAIN-UNABLE TO STAND MORE THAN 10 MIN; ARTHRITIS "ALL OVER"  . Sleep apnea 09/03/12    STOP BANG SCORE OF 7  . Tingling     RIGHT ARM AND HAND  . Neck pain   . Weakness     BOTH HANDS - S/P BILATERAL CARPAL TUNNEL RELEASE--BUT STILL HAS WEAKNESS--OFTEN DROPS THINGS    Past Surgical History  Procedure Laterality Date  .  Tubal ligation    . Carpal tunnel release      Bilateral  . Cholecystectomy    . Hernia repair    . Cardiac catheterization    . Knee arthroplasty  02/03/2012    Procedure: COMPUTER ASSISTED TOTAL KNEE ARTHROPLASTY;  Surgeon: [name], MD;  Location: [facility] OR;  Service: Orthopedics;  Laterality: Right;  Right total knee arthroplasty  . Coronary angioplasty      2010 has stent in place  . Total knee arthroplasty  09/10/2012    Procedure: TOTAL KNEE ARTHROPLASTY;  Surgeon: [name], MD;  Location: [facility] ORS;  Service: Orthopedics;  Laterality: Left;  . Trigger finger release  09/10/2012    Procedure: RELEASE TRIGGER FINGER/A-1 PULLEY;  Surgeon: [name], MD;  Location: [facility] ORS;  Service: Orthopedics;  Laterality: Right;  Right Ring Finger    Current Outpatient Prescriptions    Medication Sig Dispense Refill  . albuterol (PROVENTIL HFA;VENTOLIN HFA) 108 (90 BASE) MCG/ACT inhaler Inhale 1 puff into the lungs every 6 (six) hours as needed. Shortness of breath.      Marland Kitchen aspirin EC 325 MG tablet Take 325 mg by mouth 2 (two) times daily.      Marland Kitchen atorvastatin (LIPITOR) 80 MG tablet Take 1 tablet (80 mg total) by mouth at bedtime.  30 tablet  6  . clopidogrel (PLAVIX) 75 MG tablet Take 75 mg by mouth every morning.       . cyclobenzaprine (FLEXERIL) 10 MG tablet Take 1 tablet (10 mg total) by mouth 2 (two) times daily as needed for muscle spasms.  20 tablet  0  . escitalopram (LEXAPRO) 20 MG tablet Take 20 mg by mouth daily with breakfast.       . ezetimibe (ZETIA) 10 MG tablet Take 10 mg by mouth every evening.       . Fluticasone-Salmeterol (ADVAIR) 250-50 MCG/DOSE AEPB Inhale 1 puff into the lungs 2 (two) times daily.       . furosemide (LASIX) 40 MG tablet Take 40 mg by mouth 2 (two) times daily.      Marland Kitchen gabapentin (NEURONTIN) 300 MG capsule Take 300 mg by mouth 2 (two) times daily.       Marland Kitchen glipiZIDE (GLUCOTROL) 5 MG tablet Take 5 mg by mouth 2 (two) times daily before a meal.       . hydrochlorothiazide (HYDRODIURIL) 25 MG tablet Take 25 mg by mouth daily with breakfast.       . HYDROcodone-acetaminophen (NORCO) 7.5-325 MG per tablet Take 1-2 tablets by mouth every 4 (four) hours as needed. pain      . losartan (COZAAR) 100 MG tablet Take 100 mg by mouth daily with breakfast.       . potassium chloride SA (K-DUR,KLOR-CON) 20 MEQ tablet Take 20 mEq by mouth 2 (two) times daily.      . furosemide (LASIX) 40 MG tablet Take 1 tablet (40 mg total) by mouth 2 (two) times daily at 10 am and 4 pm.  60 tablet  0  . potassium chloride SA (K-DUR,KLOR-CON) 20 MEQ tablet Take 2 tablets (40 mEq total) by mouth daily after breakfast.  30 tablet  0   No current facility-administered medications for this visit.    No Known Allergies  History   Social History  . Marital Status:  Married    Spouse Name: N/A    Number of Children: 2  . Years of Education: N/A   Occupational History  . Disabled  Social History Main Topics  . Smoking status: Former Smoker -- 1.50 packs/day for 30 years    Types: Cigarettes  . Smokeless tobacco: Not on file     Comment:  quit 12 -13 years ago  . Alcohol Use: No  . Drug Use: No  . Sexually Active: Yes    Birth Control/ Protection: Surgical   Other Topics Concern  . Not on file   Social History Narrative  . No narrative on file    Family History  Problem Relation Age of Onset  . Cancer Mother     Breast  . COPD Father   . Cancer Brother     Sinus    Review of Systems:  As stated in the HPI and otherwise negative.   BP 129/70  Pulse 80  Ht 5\' 3"  (1.6 m)  Wt 330 lb (149.687 kg)  BMI 58.47 kg/m2  Physical Examination: General: Morbid obesity. NAD HEENT: OP clear, mucus membranes moist SKIN: warm, dry. No rashes. Neuro: No focal deficits Musculoskeletal: Muscle strength 5/5 all ext Psychiatric: Mood and affect normal Neck: No JVD, no carotid bruits, no thyromegaly, no lymphadenopathy. Lungs:Clear bilaterally, no wheezes, rhonci, crackles Cardiovascular: Regular rate and rhythm. Loud systolic murmur. No gallops or rubs. Abdomen:Soft. Bowel sounds present. Non-tender.  Extremities: No lower extremity edema. Pulses are 2 + in the bilateral DP/PT.  Echo 02/01/13:  Left ventricle: The cavity size was normal. Wall thickness was increased in a pattern of mild LVH. Systolic function was normal. The estimated ejection fraction was in the range of 60% to 65%. Wall motion was normal; there were no regional wall motion abnormalities. Doppler parameters are consistent with abnormal left ventricular relaxation (grade 1 diastolic dysfunction). - Aortic valve: There was severe stenosis. Trivial regurgitation. - Mitral valve: Calcified annulus.  Stress myoview 02/28/13: Stress Procedure: The patient received IV  Lexiscan 0.4 mg over 15-seconds. Technetium 106m Sestamibi injected at 30-seconds.This patent had sob, lt. Headedness, and chest pain with the Lexiscan infusion. Quantitative spect images were obtained after a 45 minute delay.  Stress ECG: No significant change from baseline ECG  QPS  Raw Data Images: There is interference from subdiaphragmatic activity.  Stress Images: There is a small, mild mid to apical inferolateral perfusion defect.  Rest Images: Normal homogeneous uptake in all areas of the myocardium.  Subtraction (SDS): Reversible small, mild mid to apical inferolateral perfusion defect.  Transient Ischemic Dilatation (Normal <1.22): 0.90  Lung/Heart Ratio (Normal <0.45): 0.24  Quantitative Gated Spect Images  QGS EDV: 90 ml  QGS ESV: 32 ml  Impression  Exercise Capacity: Lexiscan with no exercise.  BP Response: Normal blood pressure response.  Clinical Symptoms: Chest pain  ECG Impression: No significant ST segment change suggestive of ischemia.  Comparison with Prior Nuclear Study: No images to compare  Overall Impression: Low risk stress nuclear study. There is a reversible small, mild mid to apical inferolateral perfusion defect. There is interference from below the diaphragm so this may be artifact. However, cannot rule out a small area of ischemia.  LV Ejection Fraction: 64%. LV Wall Motion: NL LV Function; NL Wall Motion  CTA chest 02/21/13: IMPRESSION: No evidence of pulmonary embolism.  8 mm patchy/nodular opacity in the superior segment right lower lobe, favored to be infectious/inflammatory. Follow-up CT chest is suggested in 3 months to assess for persistence.  Very mild additional infectious/inflammatory opacities in the right lung, as described above.  Assessment and Plan:   1. Dyspnea: Likely multi-factorial. This could  be related to her obesity, deconditioning, aortic stenosis, CAD, asthma or chronic thromboembolic disease.    2. CAD: Some chest discomfort  but not exertional. Will continue ASA, Plavix, statin and ARB. No beta blocker secondary to asthma/COPD. BP well controlled. Lexiscan myoview with possible ischemia. Will arrange right and left heart cath next week on 03/17/13. Pre-cath labs today.   3. Aortic valve stenosis: Severe by echo 02/01/13. Normal LV function. This could be contributing to her dyspnea. Will arrange TEE on 03/14/13 to better assess her aortic valve and gradients as she will likely need aortic valve replacement in the near future.     4. H/O Pulmonary embolism: No residual PE on CTA 02/21/13. She has been off of coumadin.   I have spent 30 minutes with the patient and her husband reviewing all tests and findings as well as the plan.

## 2013-03-09 NOTE — Telephone Encounter (Signed)
Left message to call back  

## 2013-03-09 NOTE — Patient Instructions (Addendum)
Your physician recommends that you schedule a follow-up appointment in:  3-4 weeks.   Your physician has requested that you have a cardiac catheterization. Cardiac catheterization is used to diagnose and/or treat various heart conditions. Doctors may recommend this procedure for a number of different reasons. The most common reason is to evaluate chest pain. Chest pain can be a symptom of coronary artery disease (CAD), and cardiac catheterization can show whether plaque is narrowing or blocking your heart's arteries. This procedure is also used to evaluate the valves, as well as measure the blood flow and oxygen levels in different parts of your heart. For further information please visit [email or web address] Please follow instruction sheet, as given. Scheduled for March 17, 2013  Your physician has requested that you have a TEE. During a TEE, sound waves are used to create images of your heart. It provides your doctor with information about the size and shape of your heart and how well your heart's chambers and valves are working. In this test, a transducer is attached to the end of a flexible tube that's guided down your throat and into your esophagus (the tube leading from you mouth to your stomach) to get a more detailed image of your heart. You are not awake for the procedure. Please see the instruction sheet given to you today. For further information please visit [email or web address] Scheduled for March 14, 2013

## 2013-03-09 NOTE — Telephone Encounter (Signed)
Spoke with pt and she will be here for appt with Dr. Clifton James today at 3:00

## 2013-03-10 LAB — CBC WITH DIFFERENTIAL/PLATELET
Basophils Relative: 0.5 % (ref 0.0–3.0)
Eosinophils Absolute: 0.3 10*3/uL (ref 0.0–0.7)
Lymphocytes Relative: 23.9 % (ref 12.0–46.0)
MCHC: 33 g/dL (ref 30.0–36.0)
Monocytes Relative: 6.4 % (ref 3.0–12.0)
Neutrophils Relative %: 65.7 % (ref 43.0–77.0)
RBC: 4.71 Mil/uL (ref 3.87–5.11)
WBC: 8.4 10*3/uL (ref 4.5–10.5)

## 2013-03-10 LAB — BASIC METABOLIC PANEL
CO2: 33 mEq/L — ABNORMAL HIGH (ref 19–32)
Calcium: 8.9 mg/dL (ref 8.4–10.5)
Creatinine, Ser: 0.8 mg/dL (ref 0.4–1.2)
GFR: 77.34 mL/min (ref >60.00)
Sodium: 139 mEq/L (ref 135–145)

## 2013-03-10 LAB — PROTIME-INR: INR: 1.1 ratio — ABNORMAL HIGH (ref 0.8–1.0)

## 2013-03-11 ENCOUNTER — Encounter (HOSPITAL_COMMUNITY): Payer: Self-pay | Admitting: Pharmacy Technician

## 2013-03-14 ENCOUNTER — Ambulatory Visit (HOSPITAL_COMMUNITY)
Admission: RE | Admit: 2013-03-14 | Discharge: 2013-03-14 | Disposition: A | Discharge status: Home / Self Care | Payer: Medicaid Other | Source: Ambulatory Visit | Attending: Cardiology | Admitting: Cardiology

## 2013-03-14 ENCOUNTER — Encounter (HOSPITAL_COMMUNITY)
Admission: RE | Disposition: A | Discharge status: Home / Self Care | Payer: Self-pay | Source: Ambulatory Visit | Attending: Cardiology

## 2013-03-14 ENCOUNTER — Encounter (HOSPITAL_COMMUNITY): Payer: Self-pay

## 2013-03-14 DIAGNOSIS — I359 Nonrheumatic aortic valve disorder, unspecified: Secondary | ICD-10-CM | POA: Insufficient documentation

## 2013-03-14 DIAGNOSIS — I350 Nonrheumatic aortic (valve) stenosis: Secondary | ICD-10-CM

## 2013-03-14 HISTORY — PX: TEE WITHOUT CARDIOVERSION: SHX5443

## 2013-03-14 LAB — GLUCOSE, CAPILLARY: Glucose-Capillary: 103 mg/dL — ABNORMAL HIGH (ref 70–99)

## 2013-03-14 SURGERY — ECHOCARDIOGRAM, TRANSESOPHAGEAL
Anesthesia: Moderate Sedation

## 2013-03-14 MED ORDER — SODIUM CHLORIDE 0.9 % IV SOLN: INTRAVENOUS | Status: DC

## 2013-03-14 MED ORDER — MIDAZOLAM HCL 5 MG/ML IJ SOLN
INTRAMUSCULAR | Status: AC
  Filled 2013-03-14: qty 2

## 2013-03-14 MED ORDER — MIDAZOLAM HCL 5 MG/5ML IJ SOLN
INTRAMUSCULAR | Status: DC | PRN
  Administered 2013-03-14 (×2): 2 mg via INTRAVENOUS

## 2013-03-14 MED ORDER — FENTANYL CITRATE 0.05 MG/ML IJ SOLN
INTRAMUSCULAR | Status: DC | PRN
  Administered 2013-03-14 (×2): 25 ug via INTRAVENOUS

## 2013-03-14 MED ORDER — BUTAMBEN-TETRACAINE-BENZOCAINE 2-2-14 % EX AERO
INHALATION_SPRAY | CUTANEOUS | Status: DC | PRN
  Administered 2013-03-14: 2 via TOPICAL

## 2013-03-14 MED ORDER — FENTANYL CITRATE 0.05 MG/ML IJ SOLN
INTRAMUSCULAR | Status: AC
  Filled 2013-03-14: qty 4

## 2013-03-14 NOTE — Progress Notes (Signed)
  Echocardiogram Echocardiogram Transesophageal has been performed.  [name] 03/14/2013, 1:39 PM

## 2013-03-14 NOTE — H&P (View-Only) (Signed)
 History of Present Illness: 52 yo WF with history of CAD, OSA, HTN, HLD, obesity, GERD, asthma, COPD, former tobacco abuse, depression here today for cardiac follow up. She has been followed in the past in [facility]. I saw her for the first time in December 2012.She had a cardiac cath in February 2010 and had a drug eluting stent (2.75mm Xience) placed in the distal RCA per Dr. Gamble. Her LAD and Circumflex were free of disease. She had her right knee replaced in February 8,2013. She developed SOB on 02/08/12 and was found to have a PE and pneumonia. She was started on coumadin and treated for 6 months. CTA chest on 08/09/12 showed no evidence of PE so her coumadin was stopped. I last saw her February 20,2014 and she c/o severe dyspnea with minimal exertion. I felt that this was multi-factorial. Chest CTA with no PE.  I arranged a stress myoview which showed possible ischemia. Echo with severe AS, normal LV function.   She is here today for cardiac follow up. She has had some chest pressure, mostly at rest. Her breathing is poor. Dyspnea with minimal exertion. Her weight has been stable. She does not smoke. She denies syncope. Overall feeling poorly.  .  Primary Care Physician: Jenkins, Harvette   Last Lipid Profile:Lipid Panel     Component Value Date/Time   CHOL 162 01/05/2013 0918   TRIG 109.0 01/05/2013 0918   HDL 40.30 01/05/2013 0918   CHOLHDL 4 01/05/2013 0918   VLDL 21.8 01/05/2013 0918   LDLCALC 100* 01/05/2013 0918     Past Medical History  Diagnosis Date  . Asthma   . Hypertension   . Hyperlipidemia   . GERD (gastroesophageal reflux disease)   . Depression   . Aortic stenosis, moderate     per 11/2011 echo  . Blood transfusion     2013- [facility]  . Shortness of breath   . Headache     h/o migraines  . Pulmonary embolism 02/08/12    S/P RT TOTAL KNEE ON 02/03/12--ON 02/08/12--DEVELOPED ACUTE SOB AND CHEST PAIN--AND DIAGNOSED WITH  PULMONARY EMBOLUS AND PNEUMONIA  .  Restless leg syndrome   . Myocardial infarction     PT THINKS SHE WAS DX WITH MI AT THE TIME OF HEART STENTING  . CAD (coronary artery disease)     Cath 2010 with DES x 1 RCA-- PT'S CARDIOLOGIST IS DR. MCALHANY  . Uterine fibroid     NO PROBLEMS AT PRESENT FROM THE FIBROIDS-STATES SHE IS POST MENOPAUSAL-LAST MENSES 2010 EXCEPT FOR EPISODE THIS YR OF BLEEDING RELATED TO FIBROIDS.  . Heart murmur   . Pneumonia 02/08/12    HOSPITALIZED AT [facility] WITH PNEUMONIA AND WITH PULMONARY EMBOLUS  . Diabetes mellitus DIAGNOSED IN2010    ORAL MEDS  . Chronic kidney disease     PT HAS KIDNEY STONES PER XRAY--NO PROBLEMS AT PRESENT TIME FROM THE STONES; IN THE PAST SHE PASSED A STONE  . Arthritis     PAIN AND SEVERE OA LEFT KNEE ; S/P RIGHT TKA ON 02/03/12; HAS LOWER BACK PAIN-UNABLE TO STAND MORE THAN 10 MIN; ARTHRITIS "ALL OVER"  . Sleep apnea 09/03/12    STOP BANG SCORE OF 7  . Tingling     RIGHT ARM AND HAND  . Neck pain   . Weakness     BOTH HANDS - S/P BILATERAL CARPAL TUNNEL RELEASE--BUT STILL HAS WEAKNESS--OFTEN DROPS THINGS    Past Surgical History  Procedure Laterality Date  .   Tubal ligation    . Carpal tunnel release      Bilateral  . Cholecystectomy    . Hernia repair    . Cardiac catheterization    . Knee arthroplasty  02/03/2012    Procedure: COMPUTER ASSISTED TOTAL KNEE ARTHROPLASTY;  Surgeon: Christopher Y Blackman, MD;  Location: [facility] OR;  Service: Orthopedics;  Laterality: Right;  Right total knee arthroplasty  . Coronary angioplasty      2010 has stent in place  . Total knee arthroplasty  09/10/2012    Procedure: TOTAL KNEE ARTHROPLASTY;  Surgeon: Christopher Y Blackman, MD;  Location: [facility] ORS;  Service: Orthopedics;  Laterality: Left;  . Trigger finger release  09/10/2012    Procedure: RELEASE TRIGGER FINGER/A-1 PULLEY;  Surgeon: Christopher Y Blackman, MD;  Location: [facility] ORS;  Service: Orthopedics;  Laterality: Right;  Right Ring Finger    Current Outpatient Prescriptions    Medication Sig Dispense Refill  . albuterol (PROVENTIL HFA;VENTOLIN HFA) 108 (90 BASE) MCG/ACT inhaler Inhale 1 puff into the lungs every 6 (six) hours as needed. Shortness of breath.      . aspirin EC 325 MG tablet Take 325 mg by mouth 2 (two) times daily.      . atorvastatin (LIPITOR) 80 MG tablet Take 1 tablet (80 mg total) by mouth at bedtime.  30 tablet  6  . clopidogrel (PLAVIX) 75 MG tablet Take 75 mg by mouth every morning.       . cyclobenzaprine (FLEXERIL) 10 MG tablet Take 1 tablet (10 mg total) by mouth 2 (two) times daily as needed for muscle spasms.  20 tablet  0  . escitalopram (LEXAPRO) 20 MG tablet Take 20 mg by mouth daily with breakfast.       . ezetimibe (ZETIA) 10 MG tablet Take 10 mg by mouth every evening.       . Fluticasone-Salmeterol (ADVAIR) 250-50 MCG/DOSE AEPB Inhale 1 puff into the lungs 2 (two) times daily.       . furosemide (LASIX) 40 MG tablet Take 40 mg by mouth 2 (two) times daily.      . gabapentin (NEURONTIN) 300 MG capsule Take 300 mg by mouth 2 (two) times daily.       . glipiZIDE (GLUCOTROL) 5 MG tablet Take 5 mg by mouth 2 (two) times daily before a meal.       . hydrochlorothiazide (HYDRODIURIL) 25 MG tablet Take 25 mg by mouth daily with breakfast.       . HYDROcodone-acetaminophen (NORCO) 7.5-325 MG per tablet Take 1-2 tablets by mouth every 4 (four) hours as needed. pain      . losartan (COZAAR) 100 MG tablet Take 100 mg by mouth daily with breakfast.       . potassium chloride SA (K-DUR,KLOR-CON) 20 MEQ tablet Take 20 mEq by mouth 2 (two) times daily.      . furosemide (LASIX) 40 MG tablet Take 1 tablet (40 mg total) by mouth 2 (two) times daily at 10 am and 4 pm.  60 tablet  0  . potassium chloride SA (K-DUR,KLOR-CON) 20 MEQ tablet Take 2 tablets (40 mEq total) by mouth daily after breakfast.  30 tablet  0   No current facility-administered medications for this visit.    No Known Allergies  History   Social History  . Marital Status:  Married    Spouse Name: N/A    Number of Children: 2  . Years of Education: N/A   Occupational History  . Disabled      Social History Main Topics  . Smoking status: Former Smoker -- 1.50 packs/day for 30 years    Types: Cigarettes  . Smokeless tobacco: Not on file     Comment:  quit 12 -13 years ago  . Alcohol Use: No  . Drug Use: No  . Sexually Active: Yes    Birth Control/ Protection: Surgical   Other Topics Concern  . Not on file   Social History Narrative  . No narrative on file    Family History  Problem Relation Age of Onset  . Cancer Mother     Breast  . COPD Father   . Cancer Brother     Sinus    Review of Systems:  As stated in the HPI and otherwise negative.   BP 129/70  Pulse 80  Ht 5' 3" (1.6 m)  Wt 330 lb (149.687 kg)  BMI 58.47 kg/m2  Physical Examination: General: Morbid obesity. NAD HEENT: OP clear, mucus membranes moist SKIN: warm, dry. No rashes. Neuro: No focal deficits Musculoskeletal: Muscle strength 5/5 all ext Psychiatric: Mood and affect normal Neck: No JVD, no carotid bruits, no thyromegaly, no lymphadenopathy. Lungs:Clear bilaterally, no wheezes, rhonci, crackles Cardiovascular: Regular rate and rhythm. Loud systolic murmur. No gallops or rubs. Abdomen:Soft. Bowel sounds present. Non-tender.  Extremities: No lower extremity edema. Pulses are 2 + in the bilateral DP/PT.  Echo 02/01/13:  Left ventricle: The cavity size was normal. Wall thickness was increased in a pattern of mild LVH. Systolic function was normal. The estimated ejection fraction was in the range of 60% to 65%. Wall motion was normal; there were no regional wall motion abnormalities. Doppler parameters are consistent with abnormal left ventricular relaxation (grade 1 diastolic dysfunction). - Aortic valve: There was severe stenosis. Trivial regurgitation. - Mitral valve: Calcified annulus.  Stress myoview 02/28/13: Stress Procedure: The patient received IV  Lexiscan 0.4 mg over 15-seconds. Technetium 99m Sestamibi injected at 30-seconds.This patent had sob, lt. Headedness, and chest pain with the Lexiscan infusion. Quantitative spect images were obtained after a 45 minute delay.  Stress ECG: No significant change from baseline ECG  QPS  Raw Data Images: There is interference from subdiaphragmatic activity.  Stress Images: There is a small, mild mid to apical inferolateral perfusion defect.  Rest Images: Normal homogeneous uptake in all areas of the myocardium.  Subtraction (SDS): Reversible small, mild mid to apical inferolateral perfusion defect.  Transient Ischemic Dilatation (Normal <1.22): 0.90  Lung/Heart Ratio (Normal <0.45): 0.24  Quantitative Gated Spect Images  QGS EDV: 90 ml  QGS ESV: 32 ml  Impression  Exercise Capacity: Lexiscan with no exercise.  BP Response: Normal blood pressure response.  Clinical Symptoms: Chest pain  ECG Impression: No significant ST segment change suggestive of ischemia.  Comparison with Prior Nuclear Study: No images to compare  Overall Impression: Low risk stress nuclear study. There is a reversible small, mild mid to apical inferolateral perfusion defect. There is interference from below the diaphragm so this may be artifact. However, cannot rule out a small area of ischemia.  LV Ejection Fraction: 64%. LV Wall Motion: NL LV Function; NL Wall Motion  CTA chest 02/21/13: IMPRESSION: No evidence of pulmonary embolism.  8 mm patchy/nodular opacity in the superior segment right lower lobe, favored to be infectious/inflammatory. Follow-up CT chest is suggested in 3 months to assess for persistence.  Very mild additional infectious/inflammatory opacities in the right lung, as described above.  Assessment and Plan:   1. Dyspnea: Likely multi-factorial. This could   be related to her obesity, deconditioning, aortic stenosis, CAD, asthma or chronic thromboembolic disease.    2. CAD: Some chest discomfort  but not exertional. Will continue ASA, Plavix, statin and ARB. No beta blocker secondary to asthma/COPD. BP well controlled. Lexiscan myoview with possible ischemia. Will arrange right and left heart cath next week on 03/17/13. Pre-cath labs today.   3. Aortic valve stenosis: Severe by echo 02/01/13. Normal LV function. This could be contributing to her dyspnea. Will arrange TEE on 03/14/13 to better assess her aortic valve and gradients as she will likely need aortic valve replacement in the near future.     4. H/O Pulmonary embolism: No residual PE on CTA 02/21/13. She has been off of coumadin.   I have spent 30 minutes with the patient and her husband reviewing all tests and findings as well as the plan.  

## 2013-03-14 NOTE — CV Procedure (Signed)
See full TEE report in [employer].  [name]  

## 2013-03-14 NOTE — Interval H&P Note (Signed)
History and Physical Interval Note:  03/14/2013 1:03 PM  [name]  has presented today for surgery, with the diagnosis of aortic stenosis  The various methods of treatment have been discussed with the patient and family. After consideration of risks, benefits and other options for treatment, the patient has consented to  Procedure(s): TRANSESOPHAGEAL ECHOCARDIOGRAM (TEE) (N/A) as a surgical intervention .  The patient's history has been reviewed, patient examined, no change in status, stable for surgery.  I have reviewed the patient's chart and labs.  Questions were answered to the patient's satisfaction.     Olga Millers

## 2013-03-15 ENCOUNTER — Encounter (HOSPITAL_COMMUNITY): Payer: Self-pay | Admitting: Cardiology

## 2013-03-15 ENCOUNTER — Encounter (HOSPITAL_COMMUNITY): Payer: Self-pay | Admitting: Pharmacy Technician

## 2013-03-17 ENCOUNTER — Encounter (HOSPITAL_COMMUNITY)
Admission: RE | Disposition: A | Discharge status: Home / Self Care | Payer: Self-pay | Source: Ambulatory Visit | Attending: Cardiovascular Disease

## 2013-03-17 ENCOUNTER — Ambulatory Visit (HOSPITAL_COMMUNITY)
Admission: RE | Admit: 2013-03-17 | Discharge: 2013-03-17 | Disposition: A | Discharge status: Home / Self Care | Payer: Medicaid Other | Source: Ambulatory Visit | Attending: Cardiovascular Disease | Admitting: Cardiovascular Disease

## 2013-03-17 DIAGNOSIS — J4489 Other specified chronic obstructive pulmonary disease: Secondary | ICD-10-CM | POA: Insufficient documentation

## 2013-03-17 DIAGNOSIS — K219 Gastro-esophageal reflux disease without esophagitis: Secondary | ICD-10-CM | POA: Insufficient documentation

## 2013-03-17 DIAGNOSIS — I359 Nonrheumatic aortic valve disorder, unspecified: Secondary | ICD-10-CM | POA: Insufficient documentation

## 2013-03-17 DIAGNOSIS — E669 Obesity, unspecified: Secondary | ICD-10-CM | POA: Insufficient documentation

## 2013-03-17 DIAGNOSIS — I10 Essential (primary) hypertension: Secondary | ICD-10-CM | POA: Insufficient documentation

## 2013-03-17 DIAGNOSIS — G4733 Obstructive sleep apnea (adult) (pediatric): Secondary | ICD-10-CM | POA: Insufficient documentation

## 2013-03-17 DIAGNOSIS — J449 Chronic obstructive pulmonary disease, unspecified: Secondary | ICD-10-CM | POA: Insufficient documentation

## 2013-03-17 DIAGNOSIS — I2510 Atherosclerotic heart disease of native coronary artery without angina pectoris: Secondary | ICD-10-CM | POA: Insufficient documentation

## 2013-03-17 DIAGNOSIS — Z9861 Coronary angioplasty status: Secondary | ICD-10-CM | POA: Insufficient documentation

## 2013-03-17 DIAGNOSIS — E785 Hyperlipidemia, unspecified: Secondary | ICD-10-CM | POA: Insufficient documentation

## 2013-03-17 DIAGNOSIS — R9439 Abnormal result of other cardiovascular function study: Secondary | ICD-10-CM | POA: Insufficient documentation

## 2013-03-17 HISTORY — PX: LEFT AND RIGHT HEART CATHETERIZATION WITH CORONARY ANGIOGRAM: SHX5449

## 2013-03-17 LAB — POCT I-STAT 3, ART BLOOD GAS (G3+)
Acid-Base Excess: 1 mmol/L (ref 0.0–2.0)
pH, Arterial: 7.348 — ABNORMAL LOW (ref 7.350–7.450)
pO2, Arterial: 67 mmHg — ABNORMAL LOW (ref 80.0–100.0)

## 2013-03-17 LAB — POCT I-STAT 3, VENOUS BLOOD GAS (G3P V)
Bicarbonate: 26.2 mEq/L — ABNORMAL HIGH (ref 20.0–24.0)
O2 Saturation: 57 %
pCO2, Ven: 54.5 mmHg — ABNORMAL HIGH (ref 45.0–50.0)
pO2, Ven: 34 mmHg (ref 30.0–45.0)

## 2013-03-17 LAB — GLUCOSE, CAPILLARY: Glucose-Capillary: 137 mg/dL — ABNORMAL HIGH (ref 70–99)

## 2013-03-17 SURGERY — LEFT AND RIGHT HEART CATHETERIZATION WITH CORONARY ANGIOGRAM
Anesthesia: LOCAL

## 2013-03-17 MED ORDER — LIDOCAINE HCL (PF) 1 % IJ SOLN
INTRAMUSCULAR | Status: AC
  Filled 2013-03-17: qty 30

## 2013-03-17 MED ORDER — SODIUM CHLORIDE 0.9 % IV SOLN
INTRAVENOUS | Status: DC
  Administered 2013-03-17: 06:00:00 via INTRAVENOUS

## 2013-03-17 MED ORDER — ONDANSETRON HCL 4 MG/2ML IJ SOLN: 4.0000 mg | Freq: Four times a day (QID) | INTRAMUSCULAR | Status: DC | PRN

## 2013-03-17 MED ORDER — DIAZEPAM 5 MG PO TABS
5.0000 mg | ORAL_TABLET | ORAL | Status: AC
  Administered 2013-03-17: 5 mg via ORAL

## 2013-03-17 MED ORDER — ASPIRIN 81 MG PO CHEW
CHEWABLE_TABLET | ORAL | Status: AC
  Filled 2013-03-17: qty 4

## 2013-03-17 MED ORDER — HEPARIN (PORCINE) IN NACL 2-0.9 UNIT/ML-% IJ SOLN
INTRAMUSCULAR | Status: AC
  Filled 2013-03-17: qty 1000

## 2013-03-17 MED ORDER — ASPIRIN 81 MG PO CHEW
324.0000 mg | CHEWABLE_TABLET | ORAL | Status: AC
  Administered 2013-03-17: 324 mg via ORAL

## 2013-03-17 MED ORDER — FENTANYL CITRATE 0.05 MG/ML IJ SOLN
INTRAMUSCULAR | Status: AC
  Filled 2013-03-17: qty 2

## 2013-03-17 MED ORDER — SODIUM CHLORIDE 0.9 % IJ SOLN: 3.0000 mL | Freq: Two times a day (BID) | INTRAMUSCULAR | Status: DC

## 2013-03-17 MED ORDER — SODIUM CHLORIDE 0.9 % IV SOLN: INTRAVENOUS | Status: AC

## 2013-03-17 MED ORDER — SODIUM CHLORIDE 0.9 % IJ SOLN: 3.0000 mL | INTRAMUSCULAR | Status: DC | PRN

## 2013-03-17 MED ORDER — ACETAMINOPHEN 325 MG PO TABS: 650.0000 mg | ORAL_TABLET | ORAL | Status: DC | PRN

## 2013-03-17 MED ORDER — SODIUM CHLORIDE 0.9 % IV SOLN: 250.0000 mL | INTRAVENOUS | Status: DC | PRN

## 2013-03-17 MED ORDER — DIAZEPAM 5 MG PO TABS
ORAL_TABLET | ORAL | Status: AC
  Filled 2013-03-17: qty 1

## 2013-03-17 MED ORDER — MIDAZOLAM HCL 2 MG/2ML IJ SOLN
INTRAMUSCULAR | Status: AC
  Filled 2013-03-17: qty 2

## 2013-03-17 NOTE — H&P (View-Only) (Signed)
History of Present Illness: 52 yo WF with history of CAD, OSA, HTN, HLD, obesity, GERD, asthma, COPD, former tobacco abuse, depression here today for cardiac follow up. She has been followed in the past in [facility]. I saw her for the first time in December 2012.She had a cardiac cath in February 2010 and had a drug eluting stent (2.70mm Xience) placed in the distal RCA per Dr. Elsie Lincoln. Her LAD and Circumflex were free of disease. She had her right knee replaced in February 8,2013. She developed SOB on 02/08/12 and was found to have a PE and pneumonia. She was started on coumadin and treated for 6 months. CTA chest on 08/09/12 showed no evidence of PE so her coumadin was stopped. I last saw her February 20,2014 and she c/o severe dyspnea with minimal exertion. I felt that this was multi-factorial. Chest CTA with no PE.  I arranged a stress myoview which showed possible ischemia. Echo with severe AS, normal LV function.   She is here today for cardiac follow up. She has had some chest pressure, mostly at rest. Her breathing is poor. Dyspnea with minimal exertion. Her weight has been stable. She does not smoke. She denies syncope. Overall feeling poorly.  .  Primary Care Physician: Della Goo   Last Lipid Profile:Lipid Panel     Component Value Date/Time   CHOL 162 01/05/2013 0918   TRIG 109.0 01/05/2013 0918   HDL 40.30 01/05/2013 0918   CHOLHDL 4 01/05/2013 0918   VLDL 21.8 01/05/2013 0918   LDLCALC 100* 01/05/2013 0918     Past Medical History  Diagnosis Date  . Asthma   . Hypertension   . Hyperlipidemia   . GERD (gastroesophageal reflux disease)   . Depression   . Aortic stenosis, moderate     per 11/2011 echo  . Blood transfusion     2013[facility]  . Shortness of breath   . Headache     h/o migraines  . Pulmonary embolism 02/08/12    S/P RT TOTAL KNEE ON 02/03/12--ON 02/08/12--DEVELOPED ACUTE SOB AND CHEST PAIN--AND DIAGNOSED WITH  PULMONARY EMBOLUS AND PNEUMONIA  .  Restless leg syndrome   . Myocardial infarction     PT THINKS SHE WAS DX WITH MI AT THE TIME OF HEART STENTING  . CAD (coronary artery disease)     Cath 2010 with DES x 1 RCA-- PT'S CARDIOLOGIST IS DR. MCALHANY  . Uterine fibroid     NO PROBLEMS AT PRESENT FROM THE FIBROIDS-STATES SHE IS POST MENOPAUSAL-LAST MENSES 2010 EXCEPT FOR EPISODE THIS YR OF BLEEDING RELATED TO FIBROIDS.  Marland Kitchen Heart murmur   . Pneumonia 02/08/12    HOSPITALIZED AT [facility] WITH PNEUMONIA AND WITH PULMONARY EMBOLUS  . Diabetes mellitus DIAGNOSED IN2010    ORAL MEDS  . Chronic kidney disease     PT HAS KIDNEY STONES PER XRAY--NO PROBLEMS AT PRESENT TIME FROM THE STONES; IN THE PAST SHE PASSED A STONE  . Arthritis     PAIN AND SEVERE OA LEFT KNEE ; S/P RIGHT TKA ON 02/03/12; HAS LOWER BACK PAIN-UNABLE TO STAND MORE THAN 10 MIN; ARTHRITIS "ALL OVER"  . Sleep apnea 09/03/12    STOP BANG SCORE OF 7  . Tingling     RIGHT ARM AND HAND  . Neck pain   . Weakness     BOTH HANDS - S/P BILATERAL CARPAL TUNNEL RELEASE--BUT STILL HAS WEAKNESS--OFTEN DROPS THINGS    Past Surgical History  Procedure Laterality Date  .  Tubal ligation    . Carpal tunnel release      Bilateral  . Cholecystectomy    . Hernia repair    . Cardiac catheterization    . Knee arthroplasty  02/03/2012    Procedure: COMPUTER ASSISTED TOTAL KNEE ARTHROPLASTY;  Surgeon: [name], MD;  Location: [facility] OR;  Service: Orthopedics;  Laterality: Right;  Right total knee arthroplasty  . Coronary angioplasty      2010 has stent in place  . Total knee arthroplasty  09/10/2012    Procedure: TOTAL KNEE ARTHROPLASTY;  Surgeon: [name], MD;  Location: [facility] OR[facility];  Service: Orthopedics;  Laterality: Left;  . Trigger finger release  09/10/2012    Procedure: RELEASE TRIGGER FINGER/A-1 PULLEY;  Surgeon: [name], MD;  Location: [facility];  Service: Orthopedics;  Laterality: Right;  Right Ring Finger    Current Outpatient Prescriptions    Medication Sig Dispense Refill  . albuterol (PROVENTIL HFA;VENTOLIN HFA) 108 (90 BASE) MCG/ACT inhaler Inhale 1 puff into the lungs every 6 (six) hours as needed. Shortness of breath.      Marland Kitchen aspirin EC 325 MG tablet Take 325 mg by mouth 2 (two) times daily.      Marland Kitchen atorvastatin (LIPITOR) 80 MG tablet Take 1 tablet (80 mg total) by mouth at bedtime.  30 tablet  6  . clopidogrel (PLAVIX) 75 MG tablet Take 75 mg by mouth every morning.       . cyclobenzaprine (FLEXERIL) 10 MG tablet Take 1 tablet (10 mg total) by mouth 2 (two) times daily as needed for muscle spasms.  20 tablet  0  . escitalopram (LEXAPRO) 20 MG tablet Take 20 mg by mouth daily with breakfast.       . ezetimibe (ZETIA) 10 MG tablet Take 10 mg by mouth every evening.       . Fluticasone-Salmeterol (ADVAIR) 250-50 MCG/DOSE AEPB Inhale 1 puff into the lungs 2 (two) times daily.       . furosemide (LASIX) 40 MG tablet Take 40 mg by mouth 2 (two) times daily.      Marland Kitchen gabapentin (NEURONTIN) 300 MG capsule Take 300 mg by mouth 2 (two) times daily.       Marland Kitchen glipiZIDE (GLUCOTROL) 5 MG tablet Take 5 mg by mouth 2 (two) times daily before a meal.       . hydrochlorothiazide (HYDRODIURIL) 25 MG tablet Take 25 mg by mouth daily with breakfast.       . HYDROcodone-acetaminophen (NORCO) 7.5-325 MG per tablet Take 1-2 tablets by mouth every 4 (four) hours as needed. pain      . losartan (COZAAR) 100 MG tablet Take 100 mg by mouth daily with breakfast.       . potassium chloride SA (K-DUR,KLOR-CON) 20 MEQ tablet Take 20 mEq by mouth 2 (two) times daily.      . furosemide (LASIX) 40 MG tablet Take 1 tablet (40 mg total) by mouth 2 (two) times daily at 10 am and 4 pm.  60 tablet  0  . potassium chloride SA (K-DUR,KLOR-CON) 20 MEQ tablet Take 2 tablets (40 mEq total) by mouth daily after breakfast.  30 tablet  0   No current facility-administered medications for this visit.    No Known Allergies  History   Social History  . Marital Status:  Married    Spouse Name: N/A    Number of Children: 2  . Years of Education: N/A   Occupational History  . Disabled  Social History Main Topics  . Smoking status: Former Smoker -- 1.50 packs/day for 30 years    Types: Cigarettes  . Smokeless tobacco: Not on file     Comment:  quit 12 -13 years ago  . Alcohol Use: No  . Drug Use: No  . Sexually Active: Yes    Birth Control/ Protection: Surgical   Other Topics Concern  . Not on file   Social History Narrative  . No narrative on file    Family History  Problem Relation Age of Onset  . Cancer Mother     Breast  . COPD Father   . Cancer Brother     Sinus    Review of Systems:  As stated in the HPI and otherwise negative.   BP 129/70  Pulse 80  Ht 5\' 3"  (1.6 m)  Wt 330 lb (149.687 kg)  BMI 58.47 kg/m2  Physical Examination: General: Morbid obesity. NAD HEENT: OP clear, mucus membranes moist SKIN: warm, dry. No rashes. Neuro: No focal deficits Musculoskeletal: Muscle strength 5/5 all ext Psychiatric: Mood and affect normal Neck: No JVD, no carotid bruits, no thyromegaly, no lymphadenopathy. Lungs:Clear bilaterally, no wheezes, rhonci, crackles Cardiovascular: Regular rate and rhythm. Loud systolic murmur. No gallops or rubs. Abdomen:Soft. Bowel sounds present. Non-tender.  Extremities: No lower extremity edema. Pulses are 2 + in the bilateral DP/PT.  Echo 02/01/13:  Left ventricle: The cavity size was normal. Wall thickness was increased in a pattern of mild LVH. Systolic function was normal. The estimated ejection fraction was in the range of 60% to 65%. Wall motion was normal; there were no regional wall motion abnormalities. Doppler parameters are consistent with abnormal left ventricular relaxation (grade 1 diastolic dysfunction). - Aortic valve: There was severe stenosis. Trivial regurgitation. - Mitral valve: Calcified annulus.  Stress myoview 02/28/13: Stress Procedure: The patient received IV  Lexiscan 0.4 mg over 15-seconds. Technetium 44m Sestamibi injected at 30-seconds.This patent had sob, lt. Headedness, and chest pain with the Lexiscan infusion. Quantitative spect images were obtained after a 45 minute delay.  Stress ECG: No significant change from baseline ECG  QPS  Raw Data Images: There is interference from subdiaphragmatic activity.  Stress Images: There is a small, mild mid to apical inferolateral perfusion defect.  Rest Images: Normal homogeneous uptake in all areas of the myocardium.  Subtraction (SDS): Reversible small, mild mid to apical inferolateral perfusion defect.  Transient Ischemic Dilatation (Normal <1.22): 0.90  Lung/Heart Ratio (Normal <0.45): 0.24  Quantitative Gated Spect Images  QGS EDV: 90 ml  QGS ESV: 32 ml  Impression  Exercise Capacity: Lexiscan with no exercise.  BP Response: Normal blood pressure response.  Clinical Symptoms: Chest pain  ECG Impression: No significant ST segment change suggestive of ischemia.  Comparison with Prior Nuclear Study: No images to compare  Overall Impression: Low risk stress nuclear study. There is a reversible small, mild mid to apical inferolateral perfusion defect. There is interference from below the diaphragm so this may be artifact. However, cannot rule out a small area of ischemia.  LV Ejection Fraction: 64%. LV Wall Motion: NL LV Function; NL Wall Motion  CTA chest 02/21/13: IMPRESSION: No evidence of pulmonary embolism.  8 mm patchy/nodular opacity in the superior segment right lower lobe, favored to be infectious/inflammatory. Follow-up CT chest is suggested in 3 months to assess for persistence.  Very mild additional infectious/inflammatory opacities in the right lung, as described above.  Assessment and Plan:   1. Dyspnea: Likely multi-factorial. This could  be related to her obesity, deconditioning, aortic stenosis, CAD, asthma or chronic thromboembolic disease.    2. CAD: Some chest discomfort  but not exertional. Will continue ASA, Plavix, statin and ARB. No beta blocker secondary to asthma/COPD. BP well controlled. Lexiscan myoview with possible ischemia. Will arrange right and left heart cath next week on 03/17/13. Pre-cath labs today.   3. Aortic valve stenosis: Severe by echo 02/01/13. Normal LV function. This could be contributing to her dyspnea. Will arrange TEE on 03/14/13 to better assess her aortic valve and gradients as she will likely need aortic valve replacement in the near future.     4. H/O Pulmonary embolism: No residual PE on CTA 02/21/13. She has been off of coumadin.   I have spent 30 minutes with the patient and her husband reviewing all tests and findings as well as the plan.

## 2013-03-17 NOTE — Interval H&P Note (Signed)
History and Physical Interval Note:  03/17/2013 7:19 AM  [name]  has presented today for cardiac cath  with the diagnosis of Chest pain, dyspnea, abnormal stress myoview, aortic stenosis.   The various methods of treatment have been discussed with the patient and family. After consideration of risks, benefits and other options for treatment, the patient has consented to  Procedure(s): LEFT AND RIGHT HEART CATHETERIZATION WITH CORONARY ANGIOGRAM (N/A) as a surgical intervention .  The patient's history has been reviewed, patient examined, no change in status, stable for surgery.  I have reviewed the patient's chart and labs.  Questions were answered to the patient's satisfaction.     MCALHANY,CHRISTOPHER

## 2013-03-17 NOTE — CV Procedure (Signed)
Cardiac Catheterization Operative Report  [name] [id number] 3/20/20148:20 AM Ron Parker, MD  Procedure Performed:  1. Left Heart Catheterization 2. Selective Coronary Angiography 3. Right Heart Catheterization 4. Left ventricular pressures  Operator: Verne Carrow, MD  Indication: 52 yo WF with history of CAD, OSA, HTN, HLD, obesity, GERD, asthma, COPD, former tobacco abuse, depression here today for cardiac cath.  She has been followed in the past in [facility]. I saw her for the first time in December 2012.She had a cardiac cath in February 2010 and had a drug eluting stent (2.81mm Xience) placed in the distal RCA per Dr. Elsie Lincoln. Her LAD and Circumflex were free of disease. She had her right knee replaced in February 8,2013. She developed SOB on 02/08/12 and was found to have a PE and pneumonia. She was started on coumadin and treated for 6 months. CTA chest on 08/09/12 showed no evidence of PE so her coumadin was stopped. I last saw her February 20,2014 and she c/o severe dyspnea with minimal exertion. I felt that this was multi-factorial. Chest CTA with no PE. I arranged a stress myoview which showed possible ischemia. Surface echo with severe AS, normal LV function. TEE on 03/14/13 with moderate AS (mean gradient , peak gradient 41 mmHg). Cardiac cath today to assess PA pressures, exclude progression of CAD.                                   Procedure Details: The risks, benefits, complications, treatment options, and expected outcomes were discussed with the patient. The patient and/or family concurred with the proposed plan, giving informed consent. The patient was brought to the cath lab after IV hydration was begun and oral premedication was given. The patient was further sedated with Versed and Fentanyl. The right groin was prepped and draped in the usual manner. Using the modified Seldinger access technique, a 5 French sheath was  placed in the femoral artery. A 7 French sheath was inserted into the right femoral vein. A balloon tipped catheter was used to perform a right heart catheterization. Standard diagnostic catheters were used to perform selective coronary angiography. A pigtail catheter was used to measure LV pressures. There were no immediate complications. The patient was taken to the recovery area in stable condition.   Hemodynamic Findings: Ao: 125/77               LV: 156/14/21 RA:  18              RV: 48/13/16 PA: 48/17 (mean 36)        PCWP:  20 Fick Cardiac Output: 5.26 L/min Fick Cardiac Index: 2.2 L/min/m2 Central Aortic Saturation: 92% Pulmonary Artery Saturation: 57%  Aortic Valve Data: Peak to peak gradient 31 mmHg                                 Mean gradient 22 mmHg                                 AVA 1.15 cm2   Angiographic Findings:  Left main: No obstructive disease.   Left Anterior Descending Artery: Large caliber vessel that courses to the apex. There is a moderate caliber diagonal branch. No obstructive disease noted.   Circumflex Artery:  Large caliber vessel with two small obtuse marginal branches and a moderate caliber posterolateral branch. No obstructive disease noted.   Right Coronary Artery: Large, dominant vessel with 40% proximal stenosis, 40% mid stenosis, patent stent distal vessel with no restenosis noted. The PDA and posterolateral branches are patent with no obstructive disease.   Left Ventricular Angiogram: Deferred.   Impression: 1. Single vessel CAD with patent stent distal RCA 2. Moderate aortic valve stenosis 3. Elevated pulmonary pressures  Recommendations: Will continue medical management. Based on cath data and TEE findings, her aortic valve stenosis is moderate. No progression of CAD. No evidence of PE on recent CTA. Her dyspnea is likely multifactorial. Weight loss is encouraged.        Complications:  None; patient tolerated the procedure well.

## 2013-03-21 ENCOUNTER — Other Ambulatory Visit: Payer: Self-pay | Admitting: Cardiovascular Disease

## 2013-03-21 ENCOUNTER — Ambulatory Visit: Payer: Medicaid Other | Admitting: Cardiovascular Disease

## 2013-03-21 NOTE — Telephone Encounter (Signed)
  ','<  More Detail >>          Medication Renewal Request   NORMALEE SISTARE    Sent: Mon March 21, 2013 1:54 PM   To: [facility]   Phone [phone]       TAURIEL SCRONCE   MRN: [id number] DOB: 11/24/1961    Pt Home: [id number]     Entered: [phone]           Message    Original authorizing provider: Verne Carrow, MD      [name] would like a refill of the following medications:   ezetimibe (ZETIA) 10 MG tablet Verne Carrow, MD]      Preferred pharmacy: [facility] - [place], [place] - [address]      Comment:   I have getting the samples in the office at the Pharm. office is there any way I could get more. I am completely out.              Outstanding Procedures     Open Future Orders      Priority Expected Expires Ordered   CT Angio Chest W/Cm &/Or Wo Cm STAT  05/17/2014 02/17/2013   Lipid Profile Routine  05/02/2013 01/11/2013   Hepatic function panel Routine  05/02/2013 01/11/2013   CT Angio Chest W/Cm &/Or Wo Cm STAT 08/09/2012 10/09/2013 07/09/2012      Normal Orders      Priority Ordered   Discharge patient Routine 09/14/2012   Call MD / Call 911 Routine 09/14/2012   Diet - low sodium heart healthy Routine 09/14/2012   Constipation Prevention Routine 09/14/2012   Increase activity slowly as tolerated Routine 09/14/2012   Discharge instructions Routine 09/14/2012   Diet - low sodium heart healthy Routine 02/19/2012   Discharge instructions Routine 02/19/2012   Activity as tolerated - No restrictions Routine 02/19/2012   EKG 12-Lead Routine 02/09/2012   2D Echocardiogram without contrast Routine 02/08/2012   I-Stat, Chem 8 STAT 02/08/2012   EKG 12 lead Routine 01/29/2012      PT AWARE ZETIA SAMPLES LEFT AT FRONT DESK FOR PICK UP .Zack Seal

## 2013-03-21 NOTE — Telephone Encounter (Signed)
ZETIA SAMPLES LEFT AT FRONT DESK PT AWARE FOR PICK  UP .Zack Seal

## 2013-03-24 MED ORDER — EZETIMIBE 10 MG PO TABS: 10.0000 mg | ORAL_TABLET | Freq: Every evening | ORAL | Status: DC

## 2013-03-24 NOTE — Addendum Note (Signed)
Addended by: Scherrie Bateman E on: 03/24/2013 08:24 AM   Modules accepted: Orders

## 2013-03-24 NOTE — Telephone Encounter (Signed)
error 

## 2013-04-07 ENCOUNTER — Ambulatory Visit (INDEPENDENT_AMBULATORY_CARE_PROVIDER_SITE_OTHER): Payer: Medicaid Other | Admitting: Cardiovascular Disease

## 2013-04-07 ENCOUNTER — Encounter: Payer: Self-pay | Admitting: Cardiovascular Disease

## 2013-04-07 VITALS — BP 120/82 | HR 79 | Ht 63.0 in | Wt 327.0 lb

## 2013-04-07 DIAGNOSIS — I359 Nonrheumatic aortic valve disorder, unspecified: Secondary | ICD-10-CM

## 2013-04-07 DIAGNOSIS — I350 Nonrheumatic aortic (valve) stenosis: Secondary | ICD-10-CM

## 2013-04-07 DIAGNOSIS — I2782 Chronic pulmonary embolism: Secondary | ICD-10-CM

## 2013-04-07 DIAGNOSIS — I2510 Atherosclerotic heart disease of native coronary artery without angina pectoris: Secondary | ICD-10-CM

## 2013-04-07 NOTE — Patient Instructions (Addendum)
Your physician wants you to follow-up in:  6 months. You will receive a reminder letter in the mail two months in advance. If you don't receive a letter, please call our office to schedule the follow-up appointment.   

## 2013-04-07 NOTE — Progress Notes (Signed)
History of Present Illness: 52 yo WF with history of CAD, OSA, HTN, HLD, obesity, GERD, asthma, COPD, former tobacco abuse, depression here today for cardiac follow up. She has been followed in the past in [facility]. I saw her for the first time in December 2012.She had a cardiac cath in February 2010 and had a drug eluting stent (2.42mm Xience) placed in the distal RCA per Dr. Elsie Lincoln. Her LAD and Circumflex were free of disease. She had her right knee replaced in February 8,2013. She developed SOB on 02/08/12 and was found to have a PE and pneumonia. She was started on coumadin and treated for 6 months. CTA chest on 08/09/12 showed no evidence of PE so her coumadin was stopped. I last saw her February 20,2014 and she c/o severe dyspnea with minimal exertion. I felt that this was multi-factorial. Chest CTA with no PE. I arranged a stress myoview which showed possible ischemia. Echo with severe AS, normal LV function. Cardiac cath on 03/17/13 with mild non-obstructive disease and patent stent RCA. TEE with moderate AS.   She is here today for cardiac follow up. She is feeling better. No chest pain. Breathing seems to be better. Her weight has been stable. She does not smoke. She denies syncope. .  Primary Care Physician: Della Goo   Last Lipid Profile:Lipid Panel     Component Value Date/Time   CHOL 162 01/05/2013 0918   TRIG 109.0 01/05/2013 0918   HDL 40.30 01/05/2013 0918   CHOLHDL 4 01/05/2013 0918   VLDL 21.8 01/05/2013 0918   LDLCALC 100* 01/05/2013 0918     Past Medical History  Diagnosis Date  . Asthma   . Hypertension   . Hyperlipidemia   . GERD (gastroesophageal reflux disease)   . Depression   . Aortic stenosis, moderate     per 11/2011 echo  . Blood transfusion     2013[facility]  . Shortness of breath   . Headache     h/o migraines  . Pulmonary embolism 02/08/12    S/P RT TOTAL KNEE ON 02/03/12--ON 02/08/12--DEVELOPED ACUTE SOB AND CHEST PAIN--AND DIAGNOSED WITH   PULMONARY EMBOLUS AND PNEUMONIA  . Restless leg syndrome   . Myocardial infarction     PT THINKS SHE WAS DX WITH MI AT THE TIME OF HEART STENTING  . CAD (coronary artery disease)     Cath 2010 with DES x 1 RCA-- PT'S CARDIOLOGIST IS DR. MCALHANY  . Uterine fibroid     NO PROBLEMS AT PRESENT FROM THE FIBROIDS-STATES SHE IS POST MENOPAUSAL-LAST MENSES 2010 EXCEPT FOR EPISODE THIS YR OF BLEEDING RELATED TO FIBROIDS.  Marland Kitchen Heart murmur   . Pneumonia 02/08/12    HOSPITALIZED AT [facility] WITH PNEUMONIA AND WITH PULMONARY EMBOLUS  . Diabetes mellitus DIAGNOSED IN2010    ORAL MEDS  . Chronic kidney disease     PT HAS KIDNEY STONES PER XRAY--NO PROBLEMS AT PRESENT TIME FROM THE STONES; IN THE PAST SHE PASSED A STONE  . Arthritis     PAIN AND SEVERE OA LEFT KNEE ; S/P RIGHT TKA ON 02/03/12; HAS LOWER BACK PAIN-UNABLE TO STAND MORE THAN 10 MIN; ARTHRITIS "ALL OVER"  . Sleep apnea 09/03/12    STOP BANG SCORE OF 7  . Tingling     RIGHT ARM AND HAND  . Neck pain   . Weakness     BOTH HANDS - S/P BILATERAL CARPAL TUNNEL RELEASE--BUT STILL HAS WEAKNESS--OFTEN DROPS THINGS    Past  Surgical History  Procedure Laterality Date  . Tubal ligation    . Carpal tunnel release      Bilateral  . Cholecystectomy    . Hernia repair    . Cardiac catheterization    . Knee arthroplasty  02/03/2012    Procedure: COMPUTER ASSISTED TOTAL KNEE ARTHROPLASTY;  Surgeon: [name], MD;  Location: [facility] OR;  Service: Orthopedics;  Laterality: Right;  Right total knee arthroplasty  . Coronary angioplasty      2010 has stent in place  . Total knee arthroplasty  09/10/2012    Procedure: TOTAL KNEE ARTHROPLASTY;  Surgeon: [name], MD;  Location: [facility] ORS;  Service: Orthopedics;  Laterality: Left;  . Trigger finger release  09/10/2012    Procedure: RELEASE TRIGGER FINGER/A-1 PULLEY;  Surgeon: [name], MD;  Location: [facility] ORS;  Service: Orthopedics;  Laterality: Right;  Right Ring Finger  . Tee  without cardioversion N/A 03/14/2013    Procedure: TRANSESOPHAGEAL ECHOCARDIOGRAM (TEE);  Surgeon: Lewayne Bunting, MD;  Location: [facility] ENDOSCOPY;  Service: Cardiovascular;  Laterality: N/A;    Current Outpatient Prescriptions  Medication Sig Dispense Refill  . albuterol (PROVENTIL HFA;VENTOLIN HFA) 108 (90 BASE) MCG/ACT inhaler Inhale 2 puffs into the lungs every 6 (six) hours as needed for shortness of breath.       Marland Kitchen aspirin EC 325 MG tablet Take 325 mg by mouth 2 (two) times daily.      Marland Kitchen atorvastatin (LIPITOR) 80 MG tablet Take 1 tablet (80 mg total) by mouth at bedtime.  30 tablet  6  . clopidogrel (PLAVIX) 75 MG tablet Take 75 mg by mouth every morning.       . cyclobenzaprine (FLEXERIL) 10 MG tablet Take 10 mg by mouth 2 (two) times daily as needed for muscle spasms.      Marland Kitchen escitalopram (LEXAPRO) 20 MG tablet Take 20 mg by mouth daily with breakfast.       . ezetimibe (ZETIA) 10 MG tablet Take 1 tablet (10 mg total) by mouth every evening.  24 tablet  0  . Fluticasone-Salmeterol (ADVAIR) 250-50 MCG/DOSE AEPB Inhale 1 puff into the lungs 2 (two) times daily.       . furosemide (LASIX) 40 MG tablet Take 40 mg by mouth 2 (two) times daily.      Marland Kitchen gabapentin (NEURONTIN) 300 MG capsule Take 300 mg by mouth 2 (two) times daily.       Marland Kitchen glipiZIDE (GLUCOTROL) 5 MG tablet Take 5 mg by mouth 2 (two) times daily before a meal.       . hydrochlorothiazide (HYDRODIURIL) 25 MG tablet Take 25 mg by mouth daily with breakfast.       . HYDROcodone-acetaminophen (NORCO) 7.5-325 MG per tablet Take 1-2 tablets by mouth every 4 (four) hours as needed. pain      . losartan (COZAAR) 100 MG tablet Take 100 mg by mouth daily with breakfast.       . potassium chloride SA (K-DUR,KLOR-CON) 20 MEQ tablet Take 40 mEq by mouth daily.        No current facility-administered medications for this visit.    No Known Allergies  History   Social History  . Marital Status: Married    Spouse Name: N/A    Number of  Children: 2  . Years of Education: N/A   Occupational History  . Disabled    Social History Main Topics  . Smoking status: Former Smoker -- 1.50 packs/day for 30 years  Types: Cigarettes  . Smokeless tobacco: Not on file     Comment:  quit 12 -13 years ago  . Alcohol Use: No  . Drug Use: No  . Sexually Active: Yes    Birth Control/ Protection: Surgical   Other Topics Concern  . Not on file   Social History Narrative  . No narrative on file    Family History  Problem Relation Age of Onset  . Cancer Mother     Breast  . COPD Father   . Cancer Brother     Sinus    Review of Systems:  As stated in the HPI and otherwise negative.   BP 120/82  Pulse 79  Ht 5\' 3"  (1.6 m)  Wt 327 lb (148.326 kg)  BMI 57.94 kg/m2  LMP 07/26/2009  Physical Examination: General: Well developed, well nourished, NAD HEENT: OP clear, mucus membranes moist SKIN: warm, dry. No rashes. Neuro: No focal deficits Musculoskeletal: Muscle strength 5/5 all ext Psychiatric: Mood and affect normal Neck: No JVD, no carotid bruits, no thyromegaly, no lymphadenopathy. Lungs:Clear bilaterally, no wheezes, rhonci, crackles Cardiovascular: Regular rate and rhythm. No murmurs, gallops or rubs. Abdomen:Soft. Bowel sounds present. Non-tender.  Extremities: No lower extremity edema. Pulses are 2 + in the bilateral DP/PT.  EKG: NSR, rate 79 bpm. Low voltage.   Cardiac cath 03/17/13:  Hemodynamic Findings:  Ao: 125/77  LV: 156/14/21  RA: 18  RV: 48/13/16  PA: 48/17 (mean 36)  PCWP: 20  Fick Cardiac Output: 5.26 L/min  Fick Cardiac Index: 2.2 L/min/m2  Central Aortic Saturation: 92%  Pulmonary Artery Saturation: 57%  Aortic Valve Data: Peak to peak gradient 31 mmHg  Mean gradient 22 mmHg  AVA 1.15 cm2  Angiographic Findings:  Left main: No obstructive disease.  Left Anterior Descending Artery: Large caliber vessel that courses to the apex. There is a moderate caliber diagonal branch. No  obstructive disease noted.  Circumflex Artery: Large caliber vessel with two small obtuse marginal branches and a moderate caliber posterolateral branch. No obstructive disease noted.  Right Coronary Artery: Large, dominant vessel with 40% proximal stenosis, 40% mid stenosis, patent stent distal vessel with no restenosis noted. The PDA and posterolateral branches are patent with no obstructive disease.  TEE 03/14/13:   Left ventricle: Systolic function was normal. The estimated ejection fraction was in the range of 60% to 65%. Wall motion was normal; there were no regional wall motion abnormalities. - Aortic valve: There was moderate stenosis. Mild regurgitation. - Mitral valve: No evidence of vegetation. - Left atrium: The atrium was mildly dilated. No evidence of thrombus in the atrial cavity or appendage. - Atrial septum: Doppler showed no right-to-left atrial level shunt. - Tricuspid valve: No evidence of vegetation.   CTA chest 02/21/13:  No evidence of pulmonary embolism.  8 mm patchy/nodular opacity in the superior segment right lower lobe, favored to be infectious/inflammatory. Follow-up CT chest is suggested in 3 months to assess for persistence.  Very mild additional infectious/inflammatory opacities in the right lung, as described above.  Assessment and Plan:   1. Dyspnea: Likely multi-factorial. This could be related to her obesity, deconditioning, aortic stenosis, CAD, asthma or chronic thromboembolic disease. No evidence of acute PE. Cardiac cath with non-obstructive CAD. Aortic stenosis appears to be moderate by TEE.   2. CAD:  Stable. Recent cath with mild non-obstructive CAD.  Will continue ASA, Plavix, statin and ARB. No beta blocker secondary to asthma/COPD. BP well controlled.   3. Aortic valve stenosis:  Moderate by TEE. Normal LV function. Will follow with repeat echo in 6 months.   4. H/O Pulmonary embolism: No residual PE on CTA 02/21/13. She has been off of  coumadin.

## 2013-05-09 ENCOUNTER — Other Ambulatory Visit: Payer: Self-pay | Admitting: Cardiovascular Disease

## 2013-05-09 ENCOUNTER — Ambulatory Visit: Payer: Medicaid Other | Admitting: Pharmacist

## 2013-05-11 ENCOUNTER — Ambulatory Visit (INDEPENDENT_AMBULATORY_CARE_PROVIDER_SITE_OTHER): Payer: Medicaid Other | Admitting: Internal Medicine

## 2013-05-11 ENCOUNTER — Encounter: Payer: Self-pay | Admitting: Internal Medicine

## 2013-05-11 ENCOUNTER — Ambulatory Visit (INDEPENDENT_AMBULATORY_CARE_PROVIDER_SITE_OTHER): Payer: Medicaid Other | Admitting: Cardiovascular Disease

## 2013-05-11 VITALS — BP 126/80 | HR 80 | Temp 98.4°F | Ht 63.0 in | Wt 333.6 lb

## 2013-05-11 DIAGNOSIS — E785 Hyperlipidemia, unspecified: Secondary | ICD-10-CM

## 2013-05-11 DIAGNOSIS — R9389 Abnormal findings on diagnostic imaging of other specified body structures: Secondary | ICD-10-CM

## 2013-05-11 DIAGNOSIS — J45909 Unspecified asthma, uncomplicated: Secondary | ICD-10-CM

## 2013-05-11 DIAGNOSIS — I2699 Other pulmonary embolism without acute cor pulmonale: Secondary | ICD-10-CM

## 2013-05-11 LAB — HEPATIC FUNCTION PANEL
ALT: 19 U/L (ref 0–35)
AST: 16 U/L (ref 0–37)
Bilirubin, Direct: 0 mg/dL (ref 0.0–0.3)
Total Bilirubin: 0.9 mg/dL (ref 0.3–1.2)

## 2013-05-11 LAB — LIPID PANEL
Cholesterol: 186 mg/dL (ref 0–200)
LDL Cholesterol: 115 mg/dL — ABNORMAL HIGH (ref 0–99)
Total CHOL/HDL Ratio: 4

## 2013-05-11 MED ORDER — ACLIDINIUM BROMIDE 400 MCG/ACT IN AEPB: 1.0000 | INHALATION_SPRAY | Freq: Two times a day (BID) | RESPIRATORY_TRACT | Status: DC

## 2013-05-11 NOTE — Patient Instructions (Addendum)
Pepcid 20 mg one at bedtime if you continue to have the night time spells.  Plan A = automatic = symbicort 160 Take 2 puffs first thing in am and then another 2 puffs about 12 hours later follow by tudorza one twice daily   Only use your albuterol (proventil) as a rescue medication to be used if you can't catch your breath by resting or doing a relaxed purse lip breathing pattern. The less you use it, the better it will work when you need it.  Ideally less than twice weekly,  Ok to use every 4 hours if you have to.   GERD (REFLUX)  is an extremely common cause of respiratory symptoms just like yours, many times with no significant heartburn at all.    It can be treated with medication, but also with lifestyle changes including avoidance of late meals, excessive alcohol, smoking cessation, and avoid fatty foods, chocolate, peppermint, colas, red wine, and acidic juices such as orange juice.  NO MINT OR MENTHOL PRODUCTS SO NO COUGH DROPS  USE SUGARLESS CANDY INSTEAD (jolley ranchers or Stover's)  NO OIL BASED VITAMINS - use powdered substitutes.    Please schedule a follow up office visit in 4 weeks, sooner if needed

## 2013-05-11 NOTE — Progress Notes (Signed)
  Subjective:    Patient ID: [name], female    DOB: Nov 18, 1961 MRN: [id number]  HPI  3 yowf quit smoking in 2002 at wt 280 with severe sob/wheeze/ cough to point of passing out never completely improved and maintained on advair longterm but worse x 01/2013 so changed symbicort but still not back to baseline so referred 05/11/2013 to pulmonary clinic by Dr Della Goo.  05/11/2013 1st pulmonary eval cc doe x one decade but much worse x 3 months assoc dry cough on symbicort 160 but doesn't use it first thing in am and using lots of saba more day than night.    No obvious daytime variabilty or assoc   cp or chest tightness, subjective wheeze overt sinus or hb symptoms. No unusual exp hx or h/o childhood pna/ asthma or premature birth to her knowledge.   Sleeping ok without nocturnal  or early am exacerbation  of respiratory  c/o's or need for noct saba. Also denies any obvious fluctuation of symptoms with weather or environmental changes or other aggravating or alleviating factors except as outlined above   Review of Systems  Constitutional: Negative for fever, chills and unexpected weight change.  HENT: Negative for ear pain, nosebleeds, congestion, sore throat, rhinorrhea, sneezing, trouble swallowing, dental problem, voice change, postnasal drip and sinus pressure.   Eyes: Negative for visual disturbance.  Respiratory: Positive for cough and shortness of breath. Negative for choking.   Cardiovascular: Negative for chest pain and leg swelling.  Gastrointestinal: Negative for vomiting, abdominal pain and diarrhea.  Genitourinary: Negative for difficulty urinating.  Musculoskeletal: Negative for arthralgias.  Skin: Negative for rash.  Neurological: Negative for tremors, syncope and headaches.  Hematological: Does not bruise/bleed easily.       Objective:   Physical Exam Wt Readings from Last 3 Encounters:  05/11/13 333 lb 9.6 oz (151.32 kg)  04/07/13 327 lb (148.326 kg)   03/17/13 330 lb (149.687 kg)   HEENT: nl dentition, turbinates, and orophanx. Nl external ear canals without cough reflex   NECK :  without JVD/Nodes/TM/ nl carotid upstrokes bilaterally   LUNGS: no acc muscle use, clear to A and P bilaterally without cough on insp or exp maneuvers   CV:  RRR  no s3  But II to III/ VI sem or increase in P2, no edema   ABD:  soft and nontender with nl excursion in the supine position. No bruits or organomegaly, bowel sounds nl  MS:  warm without deformities, calf tenderness, cyanosis or clubbing  SKIN: warm and dry without lesions    NEURO:  alert, approp, no deficits  cta chest 02/21/13 No evidence of pulmonary embolism.  8 mm patchy/nodular opacity in the superior segment right lower  lobe, favored to be infectious/inflammatory        Assessment & Plan:

## 2013-05-12 ENCOUNTER — Ambulatory Visit (INDEPENDENT_AMBULATORY_CARE_PROVIDER_SITE_OTHER): Payer: Medicaid Other | Admitting: Pharmacist

## 2013-05-12 VITALS — Wt 330.5 lb

## 2013-05-12 DIAGNOSIS — R9389 Abnormal findings on diagnostic imaging of other specified body structures: Secondary | ICD-10-CM | POA: Insufficient documentation

## 2013-05-12 DIAGNOSIS — E785 Hyperlipidemia, unspecified: Secondary | ICD-10-CM

## 2013-05-12 NOTE — Assessment & Plan Note (Addendum)
TC 186 (goal <200), TG 92 (goal <150), HDL 52 (goal>45), LDL 115 (goal<70). LDL has increased from 100 at last visit, likely due to pt being out of her Zetia for a few weeks.  LFTs are wnl.  Lifestyle changes continue to improve.    Plan: Encouraged her not to skip meals, even if it was just eating a small snack for a meal.  Encouraged to keep up lifestyle changes as well.  Provided 4 weeks Zetia samples and will work on getting Zetia PA completed.    Follow up in 6 months, next appointment Oct 23 at 10:00 am with labs the week before.   Reviewed by Weston Brass, PharmD, CPP

## 2013-05-12 NOTE — Progress Notes (Signed)
[name] presents to clinic for follow up of her hyperlipidemia.  She has been using Lipitor 80 mg daily for years and has had no issues tolerating it.  She has been using Zetia until a few weeks ago when she ran out.    Diet: She reports eating smaller portions since last visit. She has started using a smaller plate and only getting 1 helping rather than multiples. She reports she has started noticing a difference in her appetite and food cravings, noting that she gets satisfied with the smaller portions now and she would prefer to eat fruit for snacks rather than potato chips. She doesn't like to cook when it's just her, so her meals are often small or skipped. Typical meals for her include: Breakfast - sometimes skips, sometimes cereal or English muffin with honey or egg/sausage/cheese. Coffee with sugar or sometimes sugar free creamer Lunch - sometimes skips, sometimes leftovers or [place] sandwich or bowl of cereal Dinner - chicken and broccoli casserole last time she cooked, using all low fat ingredients. Typically, a bowl of soup plus a salad, sandwich, or baked potato Snacks - trail mix, fruit (banana or apple), rarely a few potato chips Drinks - water or seltzer water mostly, seldom a soda when blood sugar gets low, occasionally tea  Exercise: She reports she has been walked with a nurse friend of hers ~2x/week for 30 min. She has to stop sometimes to catch her breath d/t her breathing issues.   Current Outpatient Prescriptions on File Prior to Visit  Medication Sig Dispense Refill  . Aclidinium Bromide (TUDORZA PRESSAIR) 400 MCG/ACT AEPB Inhale 1 puff into the lungs 2 (two) times daily. One twice daily  1 each    . albuterol (PROVENTIL HFA;VENTOLIN HFA) 108 (90 BASE) MCG/ACT inhaler Inhale 2 puffs into the lungs every 6 (six) hours as needed for shortness of breath.       Marland Kitchen aspirin EC 325 MG tablet Take 325 mg by mouth 2 (two) times daily.      Marland Kitchen atorvastatin (LIPITOR) 80 MG tablet TAKE  ONE TABLET BY MOUTH AT BEDTIME  30 tablet  0  . budesonide-formoterol (SYMBICORT) 160-4.5 MCG/ACT inhaler Inhale 2 puffs into the lungs 2 (two) times daily.      . clopidogrel (PLAVIX) 75 MG tablet Take 75 mg by mouth every morning.       . cyclobenzaprine (FLEXERIL) 10 MG tablet Take 10 mg by mouth 2 (two) times daily as needed for muscle spasms.      Marland Kitchen escitalopram (LEXAPRO) 20 MG tablet Take 20 mg by mouth daily with breakfast.       . furosemide (LASIX) 40 MG tablet Take 40 mg by mouth 2 (two) times daily.      Marland Kitchen gabapentin (NEURONTIN) 300 MG capsule Take 300 mg by mouth 2 (two) times daily.       Marland Kitchen glipiZIDE (GLUCOTROL) 5 MG tablet Take 2.5-5 mg by mouth 2 (two) times daily before a meal. 1/2 tablet in the day, 1 tablet at night      . hydrochlorothiazide (HYDRODIURIL) 25 MG tablet Take 25 mg by mouth daily with breakfast.       . HYDROcodone-acetaminophen (NORCO/VICODIN) 5-325 MG per tablet Take 1 tablet by mouth every 12 (twelve) hours as needed for pain.      Marland Kitchen losartan (COZAAR) 100 MG tablet Take 100 mg by mouth daily with breakfast.       . potassium chloride SA (K-DUR,KLOR-CON) 20 MEQ tablet Take 40  mEq by mouth daily.       Marland Kitchen ezetimibe (ZETIA) 10 MG tablet Take 1 tablet (10 mg total) by mouth every evening.  24 tablet  0   No current facility-administered medications on file prior to visit.   No Known Allergies  Lipid Panel     Component Value Date/Time   CHOL 186 05/11/2013 1449   TRIG 92.0 05/11/2013 1449   HDL 52.40 05/11/2013 1449   CHOLHDL 4 05/11/2013 1449   VLDL 18.4 05/11/2013 1449   LDLCALC 115* 05/11/2013 1449    Hepatic Function Panel     Component Value Date/Time   PROT 6.9 05/11/2013 1449   ALBUMIN 3.6 05/11/2013 1449   AST 16 05/11/2013 1449   ALT 19 05/11/2013 1449   ALKPHOS 70 05/11/2013 1449   BILITOT 0.9 05/11/2013 1449   BILIDIR 0.0 05/11/2013 1449

## 2013-05-12 NOTE — Assessment & Plan Note (Addendum)
Although there are clearly abnormalities on CT scan, they should probably be considered "microscopic" since not obvious on plain cxr .     In the setting of obvious "macroscopic" health issues,  I am very reluctatnt to embark on an invasive w/u at this point but will arrange consevative  follow up and in the meantime see what we can do to address the patient's subjective concerns.    F/u >  tickle file  For 08/21/13

## 2013-05-12 NOTE — Patient Instructions (Addendum)
You've done a great job improving your diet!  Continue exercising with your friend at least 2x/week.  Continue your Lipitor 80 mg daily and Zetia 10 mg daily.  We will work on sending a prescription to your pharmacy.  Follow up in 6 months.  Next appointment: October 23rd at 10:00 am with labs drawn the week before.

## 2013-05-12 NOTE — Assessment & Plan Note (Signed)
DDX of  difficult airways managment all start with A and  include Adherence, Ace Inhibitors, Acid Reflux, Active Sinus Disease, Alpha 1 Antitripsin deficiency, Anxiety masquerading as Airways dz,  ABPA,  allergy(esp in young), Aspiration (esp in elderly), Adverse effects of DPI,  Active smokers, plus two Bs  = Bronchiectasis and Beta blocker use..and one C= CHF  In this case Adherence is the biggest issue and starts with  inability to use HFA effectively and also  understand that SABA treats the symptoms but doesn't get to the underlying problem (inflammation).  I used  the analogy of putting steroid cream on a rash to help explain the meaning of topical therapy and the need to get the drug to the target tissue.  The proper method of use, as well as anticipated side effects, of a metered-dose inhaler are discussed and demonstrated to the patient. Improved effectiveness after extensive coaching during this visit to a level of approximately  75% so continue symbicort  ? Acid relux > high risk given wt > rec max acid suppression and diet then regroup in 4 weeks with pfts

## 2013-05-16 ENCOUNTER — Telehealth: Payer: Self-pay | Admitting: Internal Medicine

## 2013-05-16 MED ORDER — ALBUTEROL SULFATE (2.5 MG/3ML) 0.083% IN NEBU: 2.5000 mg | INHALATION_SOLUTION | RESPIRATORY_TRACT | Status: DC | PRN

## 2013-05-16 MED ORDER — COMPRESSOR/NEBULIZER MISC: Status: DC

## 2013-05-16 NOTE — Telephone Encounter (Signed)
Spoke with pt and notified of recs per MW Rx was sent to pharm  Pt to call back to move her appt up sooner with all meds

## 2013-05-16 NOTE — Telephone Encounter (Signed)
Spoke with the pt and notified of recs per MW She verbalized understanding Needs rx for neb sol b/c she has only had this given to her while in the hospital and does not recall name of med that worked well for her Please advise, thanks! No Known Allergies

## 2013-05-16 NOTE — Telephone Encounter (Signed)
albtuerol is the only med that should be used prn in combination with symbicort and tudorza, which she should continue as maintenance ) until Tammy NP or I have a chance to regroup with her - preferably 5/27 with all meds in hand Ok to call it in for a month's supply dosed 2.5 mg every 4 h prn

## 2013-05-16 NOTE — Telephone Encounter (Signed)
I spoke with pt. She stated the tudorza is not helping with her breathing,. She stated it just helps for a couple hrs and that's it. She stated the only thing that really helps her and keeps her "opened up" is nebulizer medications. Please advise MW thanks  No Known Allergies

## 2013-05-16 NOTE — Addendum Note (Signed)
Addended by: Christen Butter on: 05/16/2013 04:32 PM   Modules accepted: Orders

## 2013-05-16 NOTE — Telephone Encounter (Signed)
Ok to continue neb for now as needed but I would finish the turdorza samples and definitely maintain the symbicort 160 2bid.  If gets any worse between now and f/u pft's planned ok to rx with Prednisone 10 mg take  4 each am x 2 days,   2 each am x 2 days,  1 each am x2days and stop

## 2013-05-16 NOTE — Telephone Encounter (Signed)
Closed in error and will forward back to MW, soorry

## 2013-05-17 ENCOUNTER — Telehealth: Payer: Self-pay | Admitting: Internal Medicine

## 2013-05-17 DIAGNOSIS — J45909 Unspecified asthma, uncomplicated: Secondary | ICD-10-CM

## 2013-05-17 NOTE — Telephone Encounter (Signed)
I spoke with pt and she stated she needs her neb machine sent to [facility]. [facility] has the meds but advised her she needed to get her machine through DME. i have done so. Nothing further was needed

## 2013-05-17 NOTE — Telephone Encounter (Signed)
I spoke with pt. She stated [facility] is telling her they never received rx for her neb machine. I emailed Melissa earlier and confirmed she did receive the order. I called Melissa and LMTCB x1

## 2013-05-18 ENCOUNTER — Telehealth: Payer: Self-pay | Admitting: Internal Medicine

## 2013-05-18 NOTE — Telephone Encounter (Signed)
I spoke with pt.  Pt reports she spoke with [facility] this am and was advised they are to deliver this to her today. Advised if this does not happen, pls call us back. She verbalized understanding and voiced no further questions or concerns at this time.

## 2013-05-18 NOTE — Telephone Encounter (Signed)
I spoke with [name] and advised her this has been taken care of. Nothing further was needed

## 2013-05-25 ENCOUNTER — Other Ambulatory Visit: Payer: Self-pay | Admitting: Pharmacist

## 2013-05-25 DIAGNOSIS — E785 Hyperlipidemia, unspecified: Secondary | ICD-10-CM

## 2013-06-04 ENCOUNTER — Other Ambulatory Visit: Payer: Self-pay | Admitting: Internal Medicine

## 2013-06-08 ENCOUNTER — Ambulatory Visit (INDEPENDENT_AMBULATORY_CARE_PROVIDER_SITE_OTHER): Payer: Medicaid Other | Admitting: Internal Medicine

## 2013-06-08 ENCOUNTER — Encounter: Payer: Self-pay | Admitting: Internal Medicine

## 2013-06-08 VITALS — BP 144/90 | HR 81 | Temp 98.1°F | Ht 63.0 in | Wt 330.0 lb

## 2013-06-08 DIAGNOSIS — R0600 Dyspnea, unspecified: Secondary | ICD-10-CM

## 2013-06-08 DIAGNOSIS — J45909 Unspecified asthma, uncomplicated: Secondary | ICD-10-CM

## 2013-06-08 DIAGNOSIS — R0609 Other forms of dyspnea: Secondary | ICD-10-CM

## 2013-06-08 MED ORDER — TRAMADOL HCL 50 MG PO TABS: ORAL_TABLET | ORAL | Status: DC

## 2013-06-08 MED ORDER — PANTOPRAZOLE SODIUM 40 MG PO TBEC: 40.0000 mg | DELAYED_RELEASE_TABLET | Freq: Every day | ORAL | Status: DC

## 2013-06-08 MED ORDER — PREDNISONE (PAK) 10 MG PO TABS: ORAL_TABLET | ORAL | Status: DC

## 2013-06-08 MED ORDER — FAMOTIDINE 20 MG PO TABS: ORAL_TABLET | ORAL | Status: DC

## 2013-06-08 NOTE — Assessment & Plan Note (Signed)
No obst on today's pfts albeit p neb which may have "helped some"  rec  The proper method of use, as well as anticipated side effects, of a metered-dose inhaler are discussed and demonstrated to the patient. Improved effectiveness after extensive coaching during this visit to a level of approximately  75% so continue symbicort 160 2bid for now    Each maintenance medication was reviewed in detail including most importantly the difference between maintenance and as needed and under what circumstances the prns are to be used.  Please see instructions for details which were reviewed in writing and the patient given a copy.

## 2013-06-08 NOTE — Assessment & Plan Note (Addendum)
-   Spirometry 06/08/2013 FEV1  1.17 (46%) ratio 71 suggesting more obesity than asthma, though this was done w/in 4 h of saba which "helped some"  Symptoms are markedly disproportionate to objective findings and not clear this is a lung problem but pt does appear to have difficult airway management issues. DDX of  difficult airways managment all start with A and  include Adherence, Ace Inhibitors, Acid Reflux, Active Sinus Disease, Alpha 1 Antitripsin deficiency, Anxiety masquerading as Airways dz,  ABPA,  allergy(esp in young), Aspiration (esp in elderly), Adverse effects of DPI,  Active smokers, plus two Bs  = Bronchiectasis and Beta blocker use..and one C= CHF  Adherence is always the initial "prime suspect" and is a multilayered concern that requires a "trust but verify" approach in every patient - starting with knowing how to use medications, especially inhalers, correctly, keeping up with refills and understanding the fundamental difference between maintenance and prns vs those medications only taken for a very short course and then stopped and not refilled.   To keep things simple, I have asked the patient to first separate medicines that are perceived as maintenance, that is to be taken daily "no matter what", from those medicines that are taken on only on an as-needed basis and I have given the patient examples of both, and then return to see our NP to generate a  detailed  medication calendar which should be followed until the next physician sees the patient and updates it.    ? Acid reflux > max rx   ? Adverse effect of dpi >  D/c tudorza for now

## 2013-06-08 NOTE — Progress Notes (Signed)
Subjective:    Patient ID: [name], female    DOB: 02-06-1961 MRN: [id number]  HPI  66 yowf quit smoking in 2002 at wt 280 with severe sob/wheeze/ cough to point of passing out never completely improved and maintained on advair longterm but worse x 01/2013 so changed symbicort but still not back to baseline so referred 05/11/2013 to pulmonary clinic by Dr Della Goo.  05/11/2013 1st pulmonary eval cc doe x one decade but much worse x 3 months assoc dry cough on symbicort 160 but doesn't use it first thing in am and using lots of saba more day than night.   rec Plan A = automatic = symbicort 160 Take 2 puffs first thing in am and then another 2 puffs about 12 hours later follow by tudorza one twice daily  Only use your albuterol (proventil) as a rescue medication  GERD diet   06/08/2013 f/u ov/Tatijana Bierly did not bring meds as rec Chief Complaint  Patient presents with  . Follow-up    Breathing and cough no better since the last visit. No new co's today.   using neb 3-4 day with cough worse than sob.     No obvious daytime variabilty or assoc excess mucus   cp or chest tightness, subjective wheeze overt sinus or hb symptoms. No unusual exp hx or h/o childhood pna/ asthma or premature birth to her knowledge.   Sleeping ok without nocturnal  or early am exacerbation  of respiratory  c/o's or need for noct saba. Also denies any obvious fluctuation of symptoms with weather or environmental changes or other aggravating or alleviating factors except as outlined above .  Current Medications, Allergies, Past Medical History, Past Surgical History, Family History, and Social History were reviewed in [employer] record.  ROS  The following are not active complaints unless bolded sore throat, dysphagia, dental problems, itching, sneezing,  nasal congestion or excess/ purulent secretions, ear ache,   fever, chills, sweats, unintended wt loss, pleuritic or exertional cp,  hemoptysis,  orthopnea pnd or leg swelling, presyncope, palpitations, heartburn, abdominal pain, anorexia, nausea, vomiting, diarrhea  or change in bowel or urinary habits, change in stools or urine, dysuria,hematuria,  rash, arthralgias, visual complaints, headache, numbness weakness or ataxia or problems with walking or coordination,  change in mood/affect or memory.           Objective:   Physical Exam  06/08/2013      330  Wt Readings from Last 3 Encounters:  05/11/13 333 lb 9.6 oz (151.32 kg)  04/07/13 327 lb (148.326 kg)  03/17/13 330 lb (149.687 kg)   HEENT: nl dentition, turbinates, and orophanx. Nl external ear canals without cough reflex   NECK :  without JVD/Nodes/TM/ nl carotid upstrokes bilaterally   LUNGS: no acc muscle use, clear to A and P bilaterally without cough on insp or exp maneuvers   CV:  RRR  no s3  But II to III/ VI sem or increase in P2, no edema   ABD:  soft and nontender with nl excursion in the supine position. No bruits or organomegaly, bowel sounds nl  MS:  warm without deformities, calf tenderness, cyanosis or clubbing  SKIN: warm and dry without lesions    NEURO:  alert, approp, no deficits  cta chest 02/21/13 No evidence of pulmonary embolism.  8 mm patchy/nodular opacity in the superior segment right lower  lobe, favored to be infectious/inflammatory        Assessment & Plan:

## 2013-06-08 NOTE — Patient Instructions (Addendum)
Pantoprazole (protonix) 40 mg   Take 30-60 min before first meal of the day and Pepcid 20 mg one bedtime until return to office - this is the best way to tell whether stomach acid is contributing to your problem.    Prednisone 10 mg take  4 each am x 2 days,   2 each am x 2 days,  1 each am x 2 days and stop   Stop tudorza   GERD (REFLUX)  is an extremely common cause of respiratory symptoms, many times with no significant heartburn at all.    It can be treated with medication, but also with lifestyle changes including avoidance of late meals, excessive alcohol, smoking cessation, and avoid fatty foods, chocolate, peppermint, colas, red wine, and acidic juices such as orange juice.  NO MINT OR MENTHOL PRODUCTS SO NO COUGH DROPS  USE SUGARLESS CANDY INSTEAD (jolley ranchers or Stover's)  NO OIL BASED VITAMINS - use powdered substitutes.    Take delsym two tsp every 12 hours and supplement if needed with  Tramadol up to 2 every 4 h to suppress the urge to cough. Swallowing water or using ice chips/non mint and menthol containing candies (such as lifesavers or sugarless jolly ranchers) are also effective.  You should rest your voice and avoid activities that you know make you cough.  Once you have eliminated the cough for 3 straight days try reducing the tramadol first,  then the delsym as tolerated.    See Tammy NP w/in 2 weeks with all your medications, even over the counter meds, separated in two separate bags, the ones you take no matter what vs the ones you stop once you feel better and take only as needed when you feel you need them.   Tammy  will generate for you a new user friendly medication calendar that will put [place] all on the same page re: your medication use.     Without this process, it simply isn't possible to assure that we are providing  your outpatient care  with  the attention to detail we feel you deserve.   If we cannot assure that you're getting that kind of care,  then we  cannot manage your problem effectively from this clinic.  Once you have seen Tammy and we are sure that we're all on the same page with your medication use she will arrange follow up with me.

## 2013-06-15 ENCOUNTER — Other Ambulatory Visit (HOSPITAL_COMMUNITY): Payer: Self-pay | Admitting: Orthopaedic Surgery

## 2013-06-16 ENCOUNTER — Encounter: Payer: Self-pay | Admitting: Internal Medicine

## 2013-06-21 ENCOUNTER — Encounter: Payer: Medicaid Other | Admitting: Adult Health

## 2013-06-24 ENCOUNTER — Other Ambulatory Visit: Payer: Self-pay | Admitting: Cardiovascular Disease

## 2013-07-06 ENCOUNTER — Encounter (HOSPITAL_COMMUNITY): Payer: Self-pay | Admitting: Pharmacy Technician

## 2013-07-07 ENCOUNTER — Encounter: Payer: Self-pay | Admitting: Adult Health

## 2013-07-07 ENCOUNTER — Ambulatory Visit (INDEPENDENT_AMBULATORY_CARE_PROVIDER_SITE_OTHER): Payer: Medicaid Other | Admitting: Adult Health

## 2013-07-07 VITALS — BP 104/64 | HR 88 | Temp 98.8°F | Ht 63.0 in | Wt 331.4 lb

## 2013-07-07 DIAGNOSIS — J45909 Unspecified asthma, uncomplicated: Secondary | ICD-10-CM

## 2013-07-07 NOTE — Patient Instructions (Addendum)
Follow med calendar closely and bring to each visit  Begin Prilosec and Pepcid when able.  Set up for ONO -overnight oxygen check .  follow up Dr. Sherene Sires  In 2 months and As needed

## 2013-07-07 NOTE — Progress Notes (Signed)
  Subjective:    Patient ID: [name], female    DOB: [birth year] MRN: [id number]  HPI 87 yowf quit smoking in 2002 at wt 280 with severe sob/wheeze/ cough to point of passing out never completely improved and maintained on advair longterm but worse x 01/2013 so changed symbicort but still not back to baseline so referred 05/11/2013 to pulmonary clinic by Dr Della Goo.  05/11/2013 1st pulmonary eval cc doe x one decade but much worse x 3 months assoc dry cough on symbicort 160 but doesn't use it first thing in am and using lots of saba more day than night.   rec Plan A = automatic = symbicort 160 Take 2 puffs first thing in am and then another 2 puffs about 12 hours later follow by tudorza one twice daily  Only use your albuterol (proventil) as a rescue medication  GERD diet   06/08/2013 f/u ov/Wert did not bring meds as rec Chief Complaint  Patient presents with  . Follow-up    Breathing and cough no better since the last visit. No new co's today.   using neb 3-4 day with cough worse than sob.   >pred taper , added PPI/pepcid  07/07/2013 Follow and Med review  Returns for follow up and med review .  Has medicaid but sometimes can not afford $3 to get rx.  Was not able to get medications , she does not have the money to pick up prescriptions.  Used her husband's rx of Tramadol that helps with cough.  Feels humid weather makes her breathing worse.  With mild flare cough last ov, given steroid pack , did not pick up steroids.  +snoring and daytime sleepiness Plans for right knee surgery in couple of weeks.    Current Medications, Allergies, Past Medical History, Past Surgical History, Family History, and Social History were reviewed in [employer] record.  ROS  The following are not active complaints unless bolded sore throat, dysphagia, dental problems, itching, sneezing,  nasal congestion or excess/ purulent secretions, ear ache,   fever, chills,  sweats, unintended wt loss, pleuritic or exertional cp, hemoptysis,  orthopnea pnd or leg swelling, presyncope, palpitations, heartburn, abdominal pain, anorexia, nausea, vomiting, diarrhea  or change in bowel or urinary habits, change in stools or urine, dysuria,hematuria,  rash, arthralgias, visual complaints, headache, numbness weakness or ataxia or problems with walking or coordination,  change in mood/affect or memory.           Objective:   Physical Exam  06/08/2013      330>331 07/07/2013   HEENT: nl dentition, turbinates, and orophanx. Nl external ear canals without cough reflex   NECK :  without JVD/Nodes/TM/ nl carotid upstrokes bilaterally   LUNGS: no acc muscle use, clear to A and P bilaterally without cough on insp or exp maneuvers   CV:  RRR  no s3  But II to III/ VI sem or increase in P2, no edema   ABD:  soft and nontender with nl excursion in the supine position. No bruits or organomegaly, bowel sounds nl  MS:  warm without deformities, calf tenderness, cyanosis or clubbing  SKIN: warm and dry without lesions    NEURO:  alert, approp, no deficits  cta chest 02/21/13 No evidence of pulmonary embolism.  8 mm patchy/nodular opacity in the superior segment right lower  lobe, favored to be infectious/inflammatory        Assessment & Plan:

## 2013-07-07 NOTE — Assessment & Plan Note (Addendum)
Asthma vs RAD w/ cough  Patient's medications were reviewed today and patient education was given. Computerized medication calendar was adjusted/completed  ?nocturnal desat ?OsA (snoring , daytime sleepiness)   Plan  Discussed GERD prevention , she will try to pick up PPI and Pepcid.  Follow med calendar closely and bring to each visit  Begin Prilosec and Pepcid when able.  Set up for ONO -overnight oxygen check .  follow up Dr. Sherene Sires  In 2 months and As needed

## 2013-07-08 NOTE — Addendum Note (Signed)
Addended by: Boone Master E on: 07/08/2013 11:31 AM   Modules accepted: Orders

## 2013-07-11 ENCOUNTER — Encounter (HOSPITAL_COMMUNITY): Payer: Self-pay

## 2013-07-11 ENCOUNTER — Ambulatory Visit (HOSPITAL_COMMUNITY)
Admission: RE | Admit: 2013-07-11 | Discharge: 2013-07-11 | Disposition: A | Discharge status: Home / Self Care | Payer: Medicaid Other | Source: Ambulatory Visit | Attending: Orthopaedic Surgery | Admitting: Orthopaedic Surgery

## 2013-07-11 ENCOUNTER — Encounter (HOSPITAL_COMMUNITY)
Admission: RE | Admit: 2013-07-11 | Discharge: 2013-07-11 | Disposition: A | Discharge status: Home / Self Care | Payer: Medicaid Other | Source: Ambulatory Visit | Attending: Orthopaedic Surgery | Admitting: Orthopaedic Surgery

## 2013-07-11 DIAGNOSIS — Z01818 Encounter for other preprocedural examination: Secondary | ICD-10-CM | POA: Insufficient documentation

## 2013-07-11 DIAGNOSIS — R918 Other nonspecific abnormal finding of lung field: Secondary | ICD-10-CM | POA: Insufficient documentation

## 2013-07-11 DIAGNOSIS — Z01812 Encounter for preprocedural laboratory examination: Secondary | ICD-10-CM | POA: Insufficient documentation

## 2013-07-11 HISTORY — DX: Dermatitis, unspecified: L30.9

## 2013-07-11 HISTORY — DX: Anemia, unspecified: D64.9

## 2013-07-11 HISTORY — DX: Calculus of kidney: N20.0

## 2013-07-11 LAB — CBC
HCT: 40.2 % (ref 36.0–46.0)
MCH: 27.2 pg (ref 26.0–34.0)
MCHC: 32.6 g/dL (ref 30.0–36.0)
RDW: 14.3 % (ref 11.5–15.5)

## 2013-07-11 LAB — BASIC METABOLIC PANEL
BUN: 14 mg/dL (ref 6–23)
Chloride: 98 mEq/L (ref 96–112)
Creatinine, Ser: 0.84 mg/dL (ref 0.50–1.10)
GFR calc Af Amer: >90 mL/min (ref >90)

## 2013-07-11 LAB — URINALYSIS, ROUTINE W REFLEX MICROSCOPIC
Bilirubin Urine: NEGATIVE
Ketones, ur: NEGATIVE mg/dL
Protein, ur: NEGATIVE mg/dL
Urobilinogen, UA: 1 mg/dL (ref 0.0–1.0)

## 2013-07-11 LAB — PROTIME-INR
INR: 0.91 (ref 0.00–1.49)
Prothrombin Time: 12.1 seconds (ref 11.6–15.2)

## 2013-07-11 IMAGING — CR DG CHEST 2V
2 series · 2 of 2 positions shown · non-contrast
Comparison: Chest radiograph [DATE]; chest CT [DATE].

CLINICAL DATA: Preoperative evaluation

CHEST - 2 VIEW

[w chest pa]
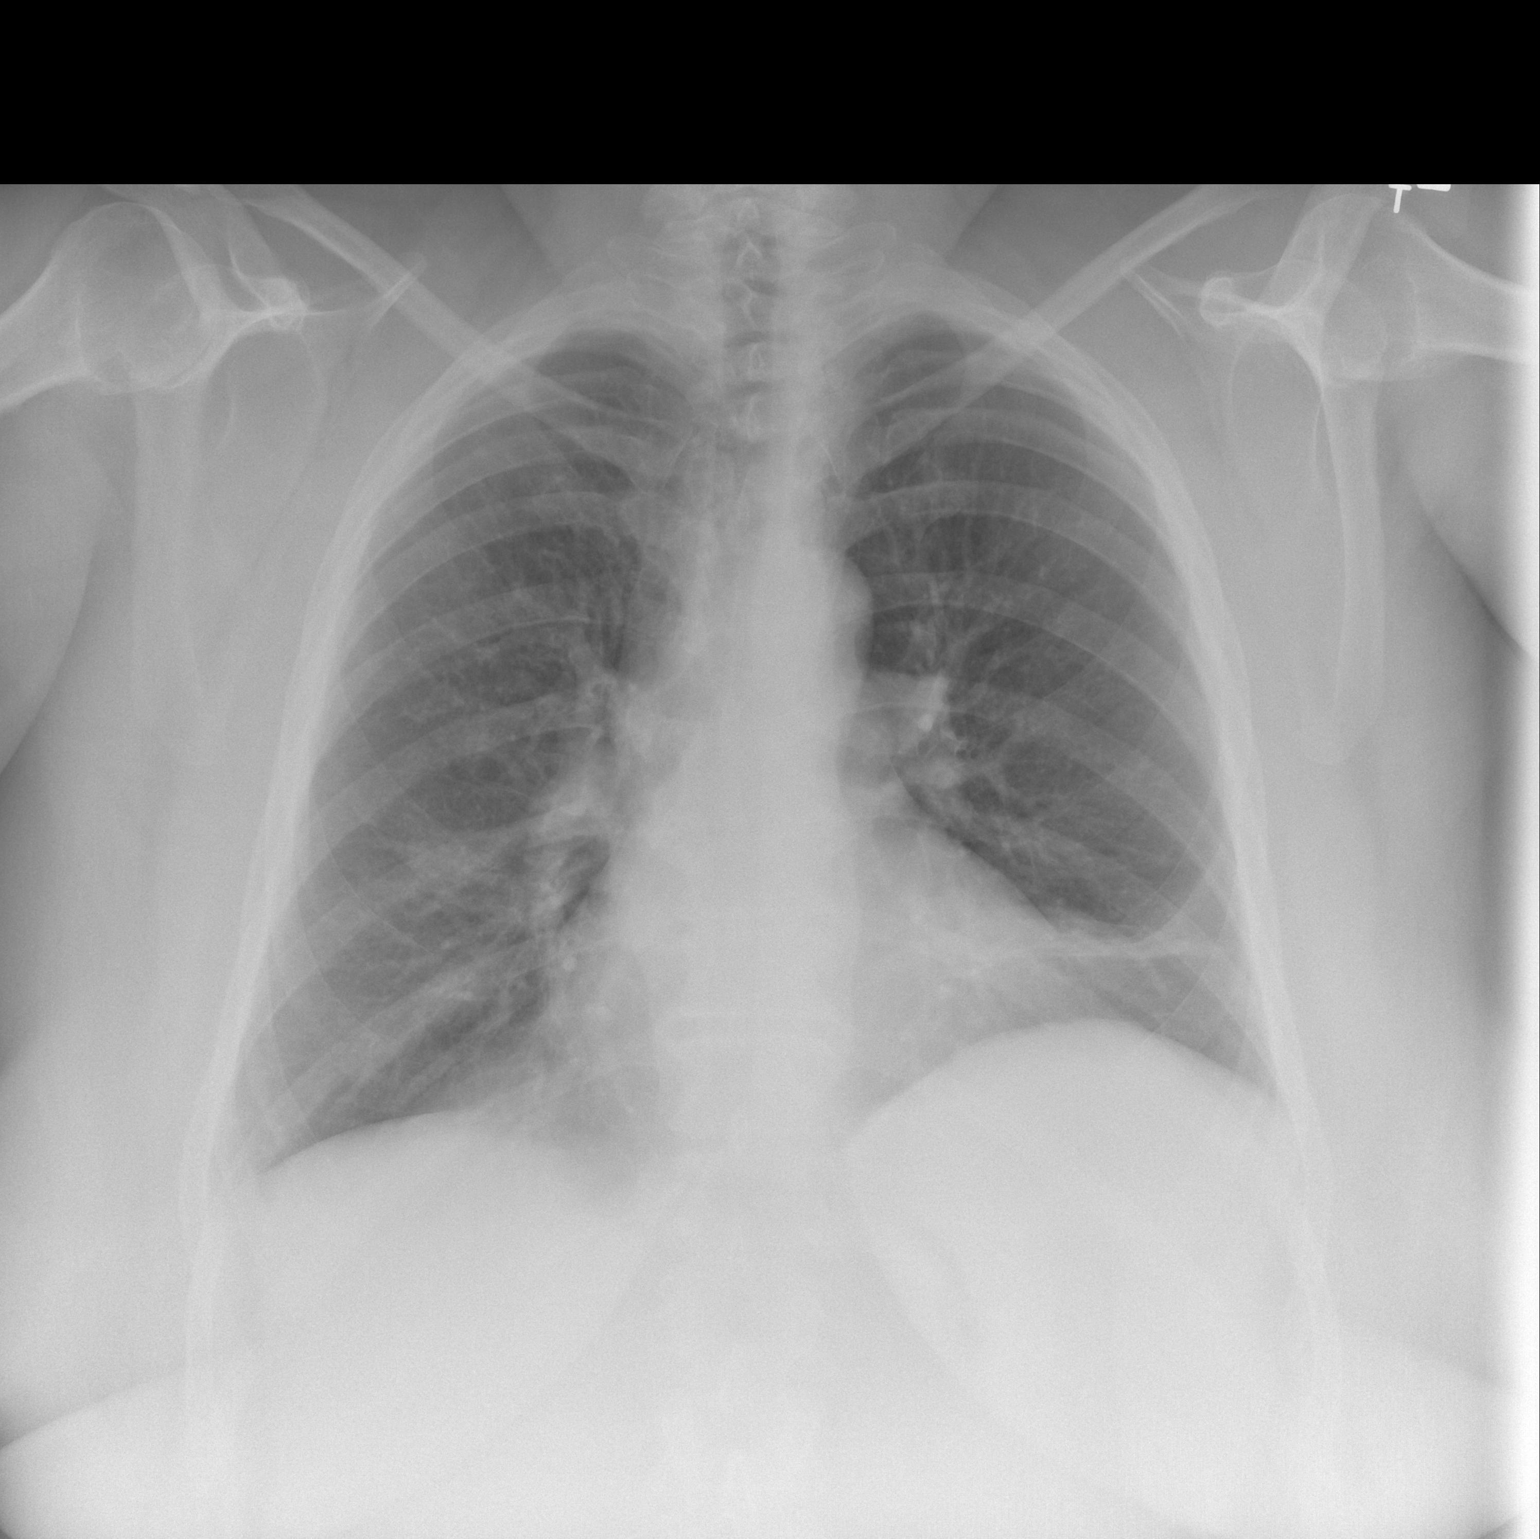

[w chest lat]
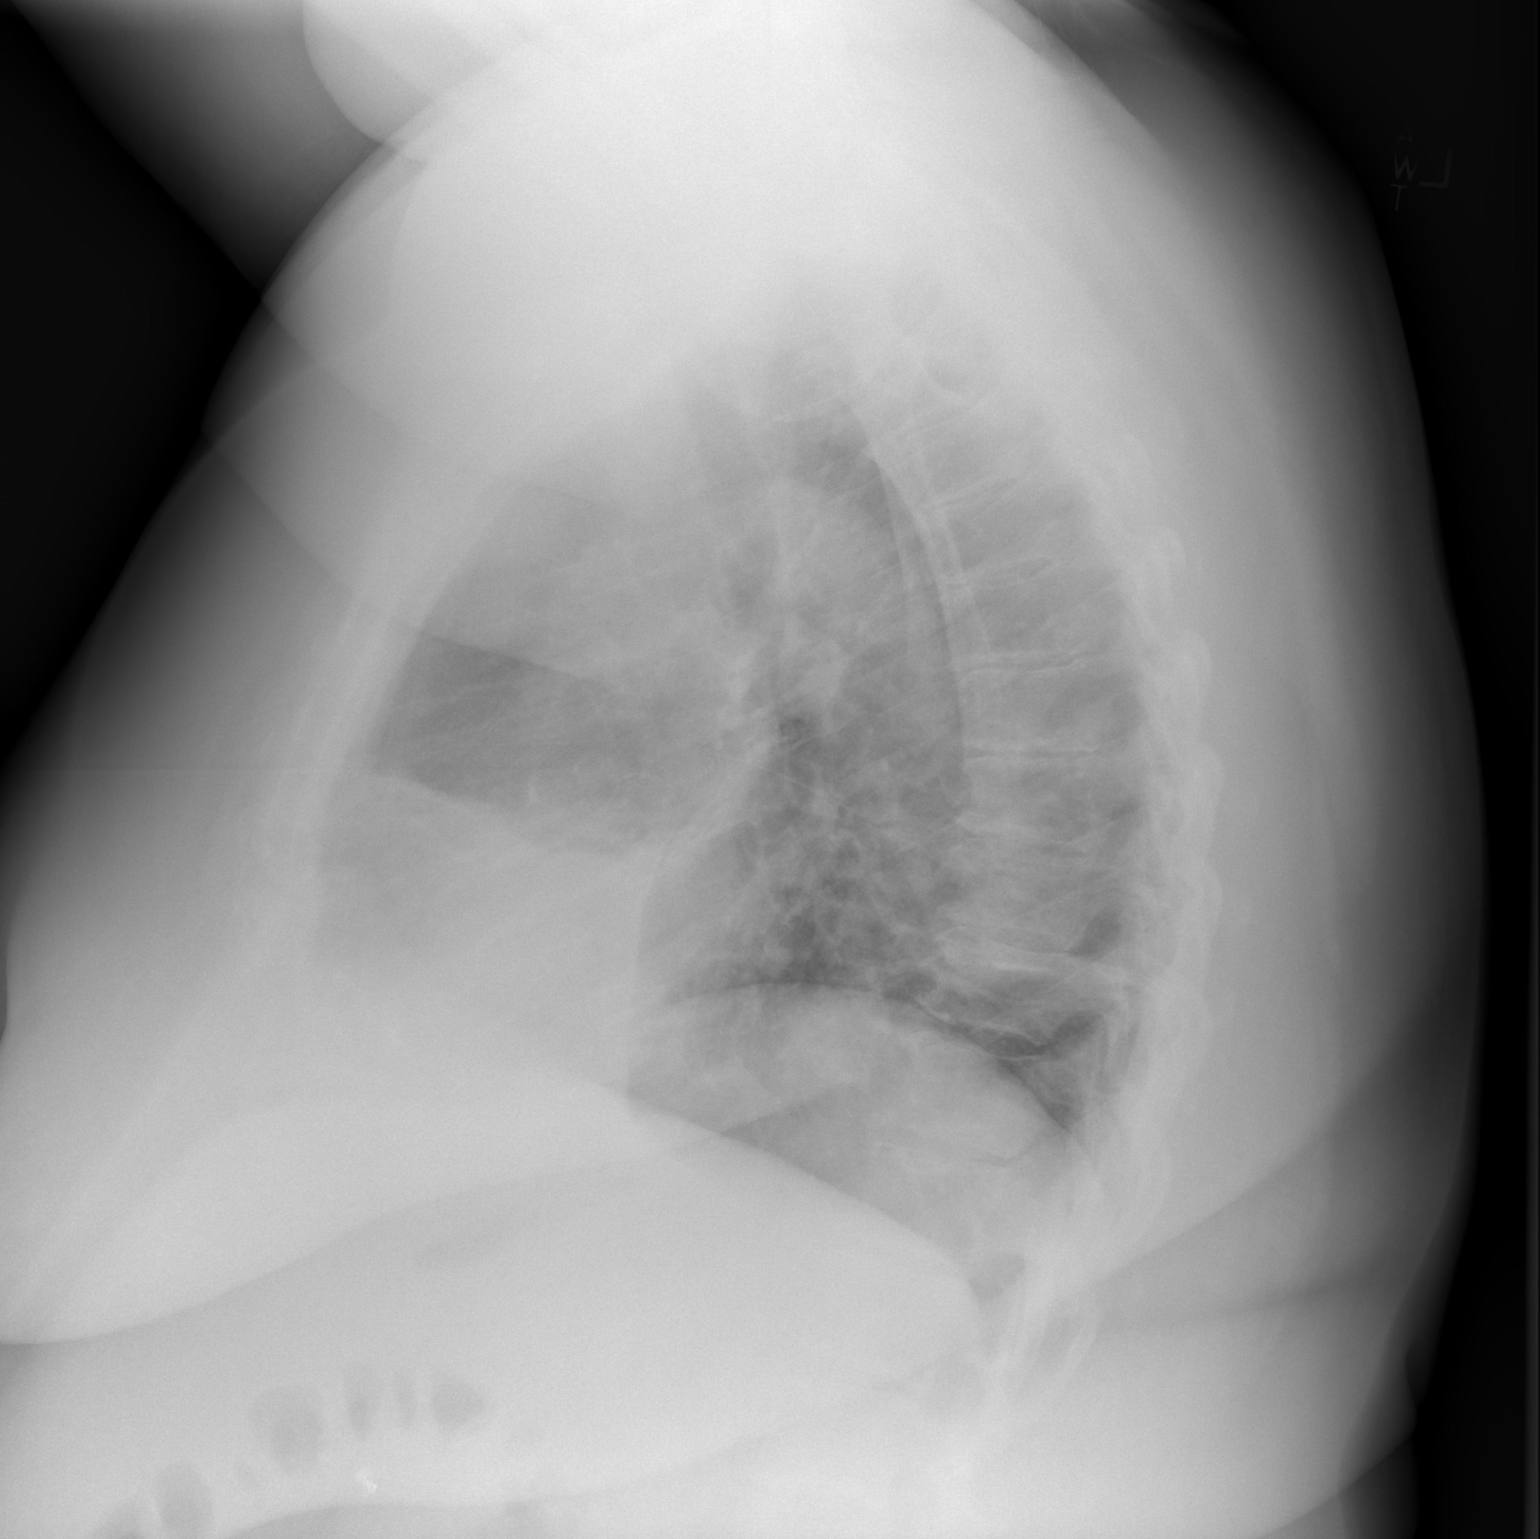

[2 of 2 positions shown; findings below may reference images not displayed]

FINDINGS: Stable cardiac and mediastinal contours.  Band-like
opacity within the left lower lobe may represent atelectasis and/or
scarring.  No large consolidative pulmonary opacity.  No pleural
effusion or pneumothorax.  Regional skeleton is unremarkable.
IMPRESSION: 1.  Band-like opacity within the left lower lobe may represent
atelectasis and/or scarring.
2.  No evidence for acute cardiopulmonary process.

## 2013-07-11 NOTE — Patient Instructions (Signed)
ADELLE ZACHAR  07/11/2013                           YOUR PROCEDURE IS SCHEDULED ON:  07/15/13               PLEASE REPORT TO SHORT STAY CENTER AT : 9:15 am               CALL THIS NUMBER IF ANY PROBLEMS THE DAY OF SURGERY :               [phone]                      REMEMBER:   Do not eat food or drink liquids AFTER MIDNIGHT   Take these medicines the morning of surgery with A SIP OF WATER: LEXAPRO / GABAPENTIN / PRILOSEC / [place] INHALERS, NEBULIZER / MAY TAKE TRAMADOL IF NEEDED             MAY HAVE WATER UNTIL 6:00 AM  Do not wear jewelry, make-up   Do not wear lotions, powders, or perfumes.   Do not shave legs or underarms 12 hrs. before surgery (men may shave face)  Do not bring valuables to the hospital.  Contacts, dentures or bridgework may not be worn into surgery.  Leave suitcase in the car. After surgery it may be brought to your room.  For patients admitted to the hospital more than one night, checkout time is 11:00                          The day of discharge.   Patients discharged the day of surgery will not be allowed to drive home                             If going home same day of surgery, must have someone stay with you first                           24 hrs at home and arrange for some one to drive you home from hospital.    Special Instructions:   Please read over the following fact sheets that you were given:               1. MRSA  INFORMATION                      2. [place] PREPARING FOR SURGERY SHEET                                                X_____________________________________________________________________        Failure to follow these instructions may result in cancellation of your surgery

## 2013-07-11 NOTE — Progress Notes (Signed)
07/11/13 1318  OBSTRUCTIVE SLEEP APNEA  Have you ever been diagnosed with sleep apnea through a sleep study? No  Do you snore loudly (loud enough to be heard through closed doors)?  1  Do you often feel tired, fatigued, or sleepy during the daytime? 1  Has anyone observed you stop breathing during your sleep? 1  Do you have, or are you being treated for high blood pressure? 1  BMI more than 35 kg/m2? 1  Age over 54 years old? 1  Neck circumference greater than 40 cm/18 inches? 1  Gender: 0  Obstructive Sleep Apnea Score 7  Score 4 or greater  Results sent to PCP

## 2013-07-12 NOTE — Progress Notes (Signed)
Abnormal UA faxed to Dr. Magnus Ivan - office notified by phone also

## 2013-07-14 ENCOUNTER — Telehealth: Payer: Self-pay | Admitting: Adult Health

## 2013-07-14 ENCOUNTER — Encounter: Payer: Self-pay | Admitting: Adult Health

## 2013-07-14 DIAGNOSIS — R0600 Dyspnea, unspecified: Secondary | ICD-10-CM

## 2013-07-14 NOTE — Telephone Encounter (Signed)
07/14/13 ONO received Per TP: +desats > begin oxygen at bedtime 2lpm.  Set up for sleep study split night.  Thanks.  ATC pt at home number - line is disconnected Called mobile number > belongs to pt's spouse > he is at work and requests to be called tomorrow morning. FYI: pt is scheduled for a surgical procedure tomorrow  Pt needs to be notified of results / recs before sending orders. Will route back to my inbasket.

## 2013-07-15 ENCOUNTER — Inpatient Hospital Stay (HOSPITAL_COMMUNITY)
Admission: RE | Admit: 2013-07-15 | Discharge: 2013-07-19 | DRG: 467 | Disposition: A | Discharge status: Home Health | Payer: Medicaid Other | Source: Ambulatory Visit | Attending: Orthopaedic Surgery | Admitting: Orthopaedic Surgery

## 2013-07-15 ENCOUNTER — Ambulatory Visit (HOSPITAL_COMMUNITY): Payer: Medicaid Other

## 2013-07-15 ENCOUNTER — Encounter (HOSPITAL_COMMUNITY): Payer: Self-pay | Admitting: *Deleted

## 2013-07-15 ENCOUNTER — Ambulatory Visit (HOSPITAL_COMMUNITY): Payer: Medicaid Other | Admitting: Anesthesiology

## 2013-07-15 ENCOUNTER — Encounter (HOSPITAL_COMMUNITY)
Admission: RE | Disposition: A | Discharge status: Home Health | Payer: Self-pay | Source: Ambulatory Visit | Attending: Orthopaedic Surgery

## 2013-07-15 ENCOUNTER — Encounter (HOSPITAL_COMMUNITY): Payer: Self-pay | Admitting: Anesthesiology

## 2013-07-15 DIAGNOSIS — Y831 Surgical operation with implant of artificial internal device as the cause of abnormal reaction of the patient, or of later complication, without mention of misadventure at the time of the procedure: Secondary | ICD-10-CM | POA: Diagnosis present

## 2013-07-15 DIAGNOSIS — G4730 Sleep apnea, unspecified: Secondary | ICD-10-CM | POA: Diagnosis present

## 2013-07-15 DIAGNOSIS — T84012A Broken internal right knee prosthesis, initial encounter: Secondary | ICD-10-CM

## 2013-07-15 DIAGNOSIS — F329 Major depressive disorder, single episode, unspecified: Secondary | ICD-10-CM | POA: Diagnosis present

## 2013-07-15 DIAGNOSIS — I252 Old myocardial infarction: Secondary | ICD-10-CM

## 2013-07-15 DIAGNOSIS — Z96659 Presence of unspecified artificial knee joint: Secondary | ICD-10-CM

## 2013-07-15 DIAGNOSIS — Z6841 Body Mass Index (BMI) 40.0 and over, adult: Secondary | ICD-10-CM

## 2013-07-15 DIAGNOSIS — Z86711 Personal history of pulmonary embolism: Secondary | ICD-10-CM

## 2013-07-15 DIAGNOSIS — Z9861 Coronary angioplasty status: Secondary | ICD-10-CM

## 2013-07-15 DIAGNOSIS — F3289 Other specified depressive episodes: Secondary | ICD-10-CM | POA: Diagnosis present

## 2013-07-15 DIAGNOSIS — T84012D Broken internal right knee prosthesis, subsequent encounter: Secondary | ICD-10-CM

## 2013-07-15 DIAGNOSIS — E119 Type 2 diabetes mellitus without complications: Secondary | ICD-10-CM | POA: Diagnosis present

## 2013-07-15 DIAGNOSIS — M1710 Unilateral primary osteoarthritis, unspecified knee: Secondary | ICD-10-CM | POA: Diagnosis present

## 2013-07-15 DIAGNOSIS — I2510 Atherosclerotic heart disease of native coronary artery without angina pectoris: Secondary | ICD-10-CM | POA: Diagnosis present

## 2013-07-15 DIAGNOSIS — T84099A Other mechanical complication of unspecified internal joint prosthesis, initial encounter: Principal | ICD-10-CM | POA: Diagnosis present

## 2013-07-15 DIAGNOSIS — I10 Essential (primary) hypertension: Secondary | ICD-10-CM | POA: Diagnosis present

## 2013-07-15 DIAGNOSIS — E785 Hyperlipidemia, unspecified: Secondary | ICD-10-CM | POA: Diagnosis present

## 2013-07-15 HISTORY — PX: TOTAL KNEE REVISION: SHX996

## 2013-07-15 LAB — GLUCOSE, CAPILLARY
Glucose-Capillary: 178 mg/dL — ABNORMAL HIGH (ref 70–99)
Glucose-Capillary: 186 mg/dL — ABNORMAL HIGH (ref 70–99)

## 2013-07-15 LAB — TYPE AND SCREEN
ABO/RH(D): A POS
Antibody Screen: NEGATIVE

## 2013-07-15 IMAGING — CR DG KNEE 1-2V PORT*R*
1 series · 2 of 2 positions shown · non-contrast
Comparison: [DATE]

CLINICAL DATA: Right total knee revision

PORTABLE RIGHT KNEE - 1-2 VIEW

[Series 1: AP · right · 2 of 2 slices shown]
[im 1/2]
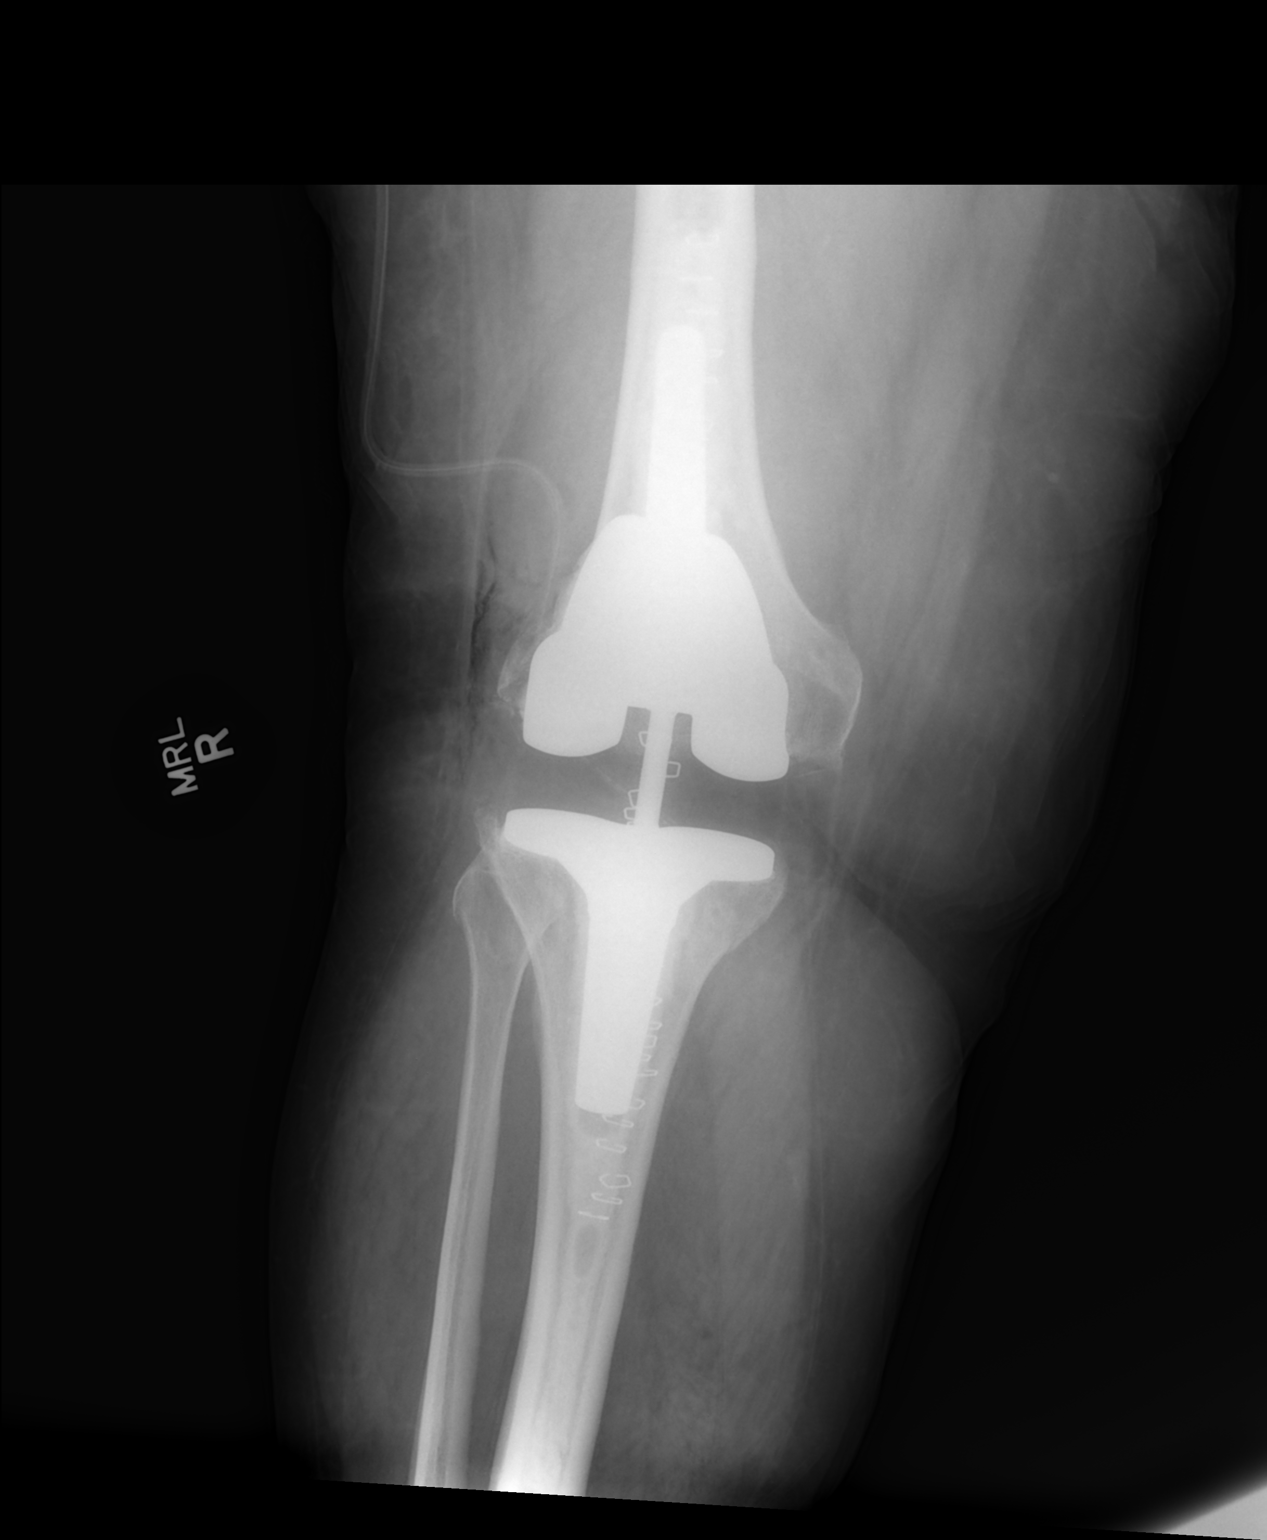
[im 2/2]
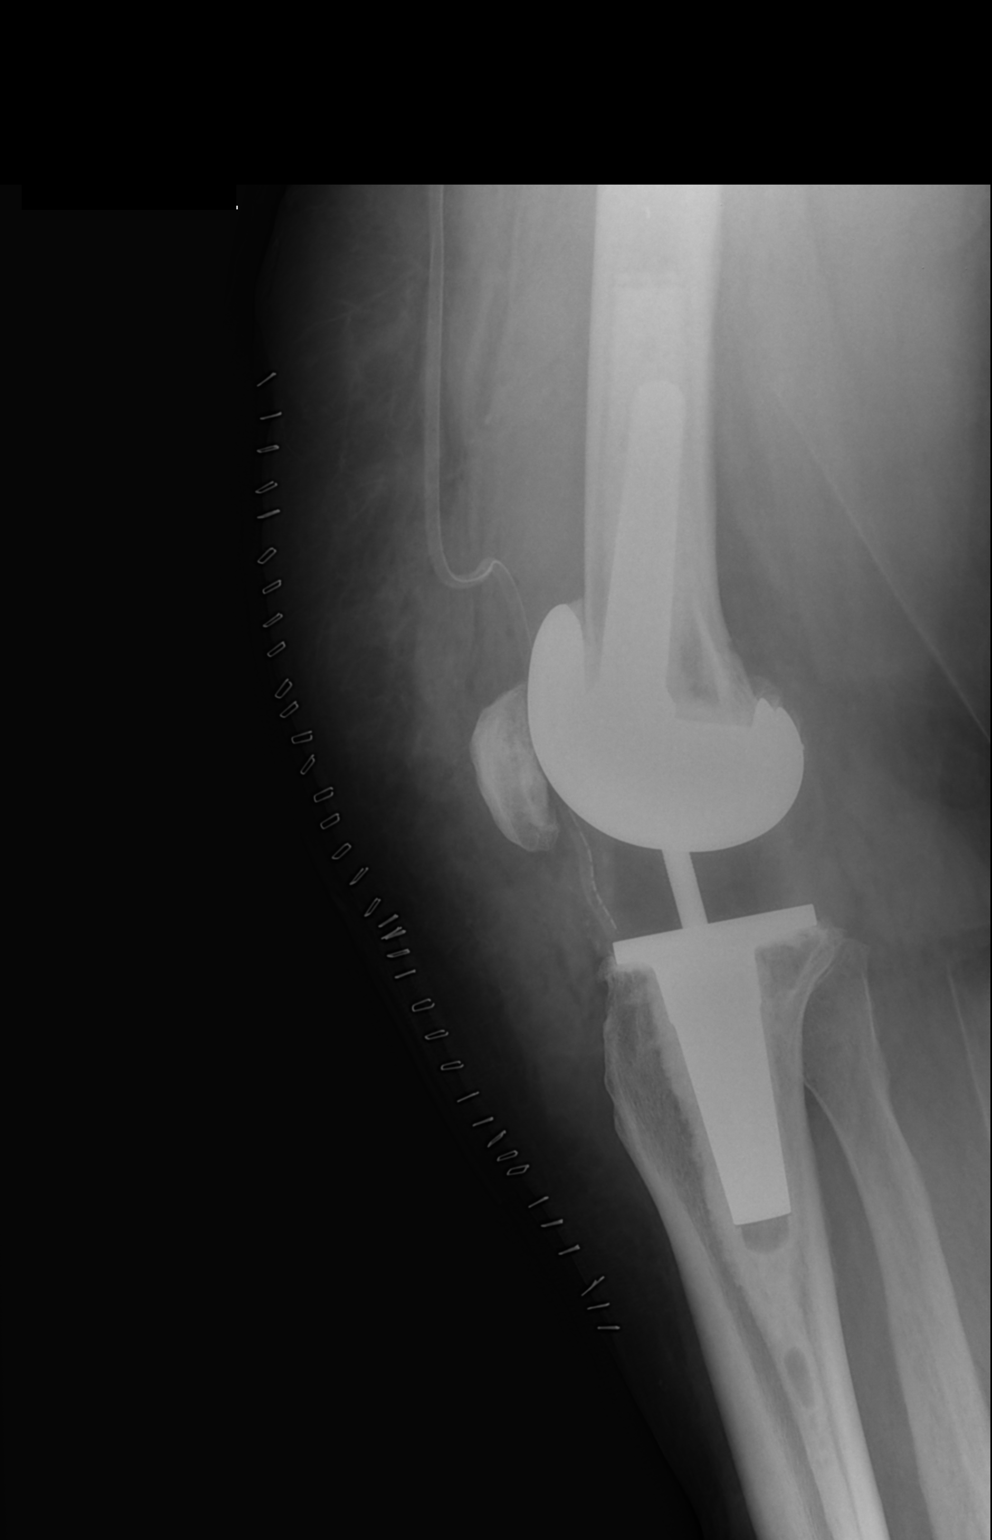

[2 of 2 positions shown; findings below may reference images not displayed]

FINDINGS: Right total knee arthroplasty with spacer.  Revision of
the distal humeral component.

Associated soft tissue swelling, subcutaneous gas, and lateral
surgical drain. Overlying skin staples.

Old post-traumatic deformity of the medial tibial plateau.

Large suprapatellar effusion.
IMPRESSION: Status post revision of right total knee arthroplasty, as described
above.

## 2013-07-15 SURGERY — TOTAL KNEE REVISION
Anesthesia: General | Site: Knee | Laterality: Right | Wound class: Clean

## 2013-07-15 MED ORDER — SODIUM CHLORIDE 0.9 % IR SOLN
Status: DC | PRN
  Administered 2013-07-15: 3000 mL

## 2013-07-15 MED ORDER — MIDAZOLAM HCL 5 MG/5ML IJ SOLN
INTRAMUSCULAR | Status: DC | PRN
  Administered 2013-07-15: 2 mg via INTRAVENOUS

## 2013-07-15 MED ORDER — ALUM & MAG HYDROXIDE-SIMETH 200-200-20 MG/5ML PO SUSP: 30.0000 mL | ORAL | Status: DC | PRN

## 2013-07-15 MED ORDER — LOSARTAN POTASSIUM 50 MG PO TABS
100.0000 mg | ORAL_TABLET | Freq: Every day | ORAL | Status: DC
  Administered 2013-07-16 – 2013-07-19 (×4): 100 mg via ORAL
  Filled 2013-07-15 (×5): qty 2

## 2013-07-15 MED ORDER — POTASSIUM CHLORIDE CRYS ER 20 MEQ PO TBCR
40.0000 meq | EXTENDED_RELEASE_TABLET | Freq: Every day | ORAL | Status: DC
  Administered 2013-07-15 – 2013-07-19 (×5): 40 meq via ORAL
  Filled 2013-07-15 (×5): qty 2

## 2013-07-15 MED ORDER — KETOROLAC TROMETHAMINE 30 MG/ML IJ SOLN
30.0000 mg | Freq: Once | INTRAMUSCULAR | Status: AC
  Administered 2013-07-15: 30 mg via INTRAVENOUS

## 2013-07-15 MED ORDER — ACETAMINOPHEN 325 MG PO TABS
650.0000 mg | ORAL_TABLET | Freq: Four times a day (QID) | ORAL | Status: DC | PRN
  Administered 2013-07-19: 650 mg via ORAL
  Filled 2013-07-15 (×2): qty 2

## 2013-07-15 MED ORDER — HYDROMORPHONE HCL PF 1 MG/ML IJ SOLN
0.2500 mg | INTRAMUSCULAR | Status: DC | PRN
  Administered 2013-07-15 (×2): 0.5 mg via INTRAVENOUS
  Administered 2013-07-15 (×2): 0.25 mg via INTRAVENOUS

## 2013-07-15 MED ORDER — GABAPENTIN 300 MG PO CAPS
300.0000 mg | ORAL_CAPSULE | Freq: Two times a day (BID) | ORAL | Status: DC
  Administered 2013-07-15 – 2013-07-19 (×8): 300 mg via ORAL
  Filled 2013-07-15 (×9): qty 1

## 2013-07-15 MED ORDER — 0.9 % SODIUM CHLORIDE (POUR BTL) OPTIME
TOPICAL | Status: DC | PRN
  Administered 2013-07-15: 1000 mL

## 2013-07-15 MED ORDER — SUCCINYLCHOLINE CHLORIDE 20 MG/ML IJ SOLN
INTRAMUSCULAR | Status: DC | PRN
  Administered 2013-07-15: 200 mg via INTRAVENOUS

## 2013-07-15 MED ORDER — BUDESONIDE-FORMOTEROL FUMARATE 160-4.5 MCG/ACT IN AERO
2.0000 | INHALATION_SPRAY | Freq: Two times a day (BID) | RESPIRATORY_TRACT | Status: DC
  Administered 2013-07-15 – 2013-07-19 (×8): 2 via RESPIRATORY_TRACT
  Filled 2013-07-15: qty 6

## 2013-07-15 MED ORDER — DOCUSATE SODIUM 100 MG PO CAPS
100.0000 mg | ORAL_CAPSULE | Freq: Two times a day (BID) | ORAL | Status: DC
  Administered 2013-07-15 – 2013-07-19 (×8): 100 mg via ORAL
  Filled 2013-07-15 (×6): qty 1

## 2013-07-15 MED ORDER — METHOCARBAMOL 500 MG PO TABS
500.0000 mg | ORAL_TABLET | Freq: Four times a day (QID) | ORAL | Status: DC | PRN
  Administered 2013-07-15 – 2013-07-19 (×2): 500 mg via ORAL
  Filled 2013-07-15 (×3): qty 1

## 2013-07-15 MED ORDER — EPHEDRINE SULFATE 50 MG/ML IJ SOLN
INTRAMUSCULAR | Status: DC | PRN
  Administered 2013-07-15: 10 mg via INTRAVENOUS

## 2013-07-15 MED ORDER — ESCITALOPRAM OXALATE 20 MG PO TABS
20.0000 mg | ORAL_TABLET | Freq: Every morning | ORAL | Status: DC
  Administered 2013-07-16 – 2013-07-19 (×4): 20 mg via ORAL
  Filled 2013-07-15 (×4): qty 1

## 2013-07-15 MED ORDER — ONDANSETRON HCL 4 MG PO TABS
4.0000 mg | ORAL_TABLET | Freq: Four times a day (QID) | ORAL | Status: DC | PRN
  Administered 2013-07-18: 4 mg via ORAL
  Filled 2013-07-15: qty 1

## 2013-07-15 MED ORDER — LACTATED RINGERS IV SOLN
INTRAVENOUS | Status: DC
  Administered 2013-07-15 (×2): via INTRAVENOUS
  Administered 2013-07-15: 1000 mL via INTRAVENOUS

## 2013-07-15 MED ORDER — CEFAZOLIN SODIUM-DEXTROSE 2-3 GM-% IV SOLR
2.0000 g | Freq: Four times a day (QID) | INTRAVENOUS | Status: AC
  Administered 2013-07-15 (×2): 2 g via INTRAVENOUS
  Filled 2013-07-15 (×2): qty 50

## 2013-07-15 MED ORDER — ADULT MULTIVITAMIN W/MINERALS CH
1.0000 | ORAL_TABLET | Freq: Every day | ORAL | Status: DC
  Administered 2013-07-16 – 2013-07-19 (×4): 1 via ORAL
  Filled 2013-07-15 (×4): qty 1

## 2013-07-15 MED ORDER — HYDROMORPHONE HCL PF 1 MG/ML IJ SOLN
INTRAMUSCULAR | Status: AC
  Filled 2013-07-15: qty 1

## 2013-07-15 MED ORDER — ASPIRIN EC 325 MG PO TBEC
325.0000 mg | DELAYED_RELEASE_TABLET | Freq: Two times a day (BID) | ORAL | Status: DC
  Administered 2013-07-15 – 2013-07-19 (×8): 325 mg via ORAL
  Filled 2013-07-15 (×9): qty 1

## 2013-07-15 MED ORDER — HYDROMORPHONE HCL PF 1 MG/ML IJ SOLN
1.0000 mg | INTRAMUSCULAR | Status: DC | PRN
  Administered 2013-07-15 (×2): 1 mg via INTRAVENOUS
  Filled 2013-07-15 (×2): qty 1

## 2013-07-15 MED ORDER — OXYCODONE HCL ER 20 MG PO T12A
20.0000 mg | EXTENDED_RELEASE_TABLET | Freq: Two times a day (BID) | ORAL | Status: DC
  Administered 2013-07-15 – 2013-07-18 (×6): 20 mg via ORAL
  Filled 2013-07-15 (×6): qty 1

## 2013-07-15 MED ORDER — PROPOFOL 10 MG/ML IV BOLUS
INTRAVENOUS | Status: DC | PRN
  Administered 2013-07-15: 200 mg via INTRAVENOUS

## 2013-07-15 MED ORDER — ALBUTEROL SULFATE (5 MG/ML) 0.5% IN NEBU
2.5000 mg | INHALATION_SOLUTION | Freq: Four times a day (QID) | RESPIRATORY_TRACT | Status: DC | PRN
  Administered 2013-07-15 – 2013-07-17 (×2): 2.5 mg via RESPIRATORY_TRACT
  Filled 2013-07-15 (×2): qty 0.5

## 2013-07-15 MED ORDER — PANTOPRAZOLE SODIUM 40 MG PO TBEC
40.0000 mg | DELAYED_RELEASE_TABLET | Freq: Every day | ORAL | Status: DC
  Administered 2013-07-15 – 2013-07-19 (×5): 40 mg via ORAL
  Filled 2013-07-15 (×4): qty 1

## 2013-07-15 MED ORDER — ACETAMINOPHEN 650 MG RE SUPP: 650.0000 mg | Freq: Four times a day (QID) | RECTAL | Status: DC | PRN

## 2013-07-15 MED ORDER — FERROUS SULFATE 325 (65 FE) MG PO TABS
325.0000 mg | ORAL_TABLET | Freq: Three times a day (TID) | ORAL | Status: DC
  Administered 2013-07-16 – 2013-07-19 (×11): 325 mg via ORAL
  Filled 2013-07-15 (×13): qty 1

## 2013-07-15 MED ORDER — MENTHOL 3 MG MT LOZG
1.0000 | LOZENGE | OROMUCOSAL | Status: DC | PRN
  Filled 2013-07-15: qty 9

## 2013-07-15 MED ORDER — POLYETHYLENE GLYCOL 3350 17 G PO PACK
17.0000 g | PACK | Freq: Every day | ORAL | Status: DC | PRN
  Administered 2013-07-16 – 2013-07-17 (×2): 17 g via ORAL
  Filled 2013-07-15 (×2): qty 1

## 2013-07-15 MED ORDER — ZOLPIDEM TARTRATE 5 MG PO TABS: 5.0000 mg | ORAL_TABLET | Freq: Every evening | ORAL | Status: DC | PRN

## 2013-07-15 MED ORDER — PROMETHAZINE HCL 25 MG/ML IJ SOLN: 6.2500 mg | INTRAMUSCULAR | Status: DC | PRN

## 2013-07-15 MED ORDER — OXYCODONE HCL 5 MG/5ML PO SOLN
5.0000 mg | Freq: Once | ORAL | Status: DC | PRN
  Filled 2013-07-15: qty 5

## 2013-07-15 MED ORDER — BUPIVACAINE HCL (PF) 0.25 % IJ SOLN
INTRAMUSCULAR | Status: AC
  Filled 2013-07-15: qty 30

## 2013-07-15 MED ORDER — OXYCODONE HCL 5 MG PO TABS: 5.0000 mg | ORAL_TABLET | Freq: Once | ORAL | Status: DC | PRN

## 2013-07-15 MED ORDER — ALBUTEROL SULFATE HFA 108 (90 BASE) MCG/ACT IN AERS
2.0000 | INHALATION_SPRAY | RESPIRATORY_TRACT | Status: DC | PRN
  Administered 2013-07-16 – 2013-07-17 (×2): 2 via RESPIRATORY_TRACT
  Filled 2013-07-15: qty 6.7

## 2013-07-15 MED ORDER — METHOCARBAMOL 100 MG/ML IJ SOLN
500.0000 mg | Freq: Four times a day (QID) | INTRAVENOUS | Status: DC | PRN
  Administered 2013-07-15: 500 mg via INTRAVENOUS
  Filled 2013-07-15 (×2): qty 5

## 2013-07-15 MED ORDER — PHENOL 1.4 % MT LIQD
1.0000 | OROMUCOSAL | Status: DC | PRN
  Filled 2013-07-15: qty 177

## 2013-07-15 MED ORDER — CLOPIDOGREL BISULFATE 75 MG PO TABS
75.0000 mg | ORAL_TABLET | Freq: Every day | ORAL | Status: DC
  Administered 2013-07-16 – 2013-07-19 (×4): 75 mg via ORAL
  Filled 2013-07-15 (×5): qty 1

## 2013-07-15 MED ORDER — FENTANYL CITRATE 0.05 MG/ML IJ SOLN
INTRAMUSCULAR | Status: DC | PRN
  Administered 2013-07-15 (×2): 50 ug via INTRAVENOUS
  Administered 2013-07-15: 100 ug via INTRAVENOUS
  Administered 2013-07-15: 50 ug via INTRAVENOUS

## 2013-07-15 MED ORDER — ONDANSETRON HCL 4 MG/2ML IJ SOLN
4.0000 mg | Freq: Four times a day (QID) | INTRAMUSCULAR | Status: DC | PRN
  Administered 2013-07-16: 4 mg via INTRAVENOUS
  Filled 2013-07-15 (×2): qty 2

## 2013-07-15 MED ORDER — EZETIMIBE 10 MG PO TABS
10.0000 mg | ORAL_TABLET | Freq: Every evening | ORAL | Status: DC
  Administered 2013-07-15 – 2013-07-18 (×4): 10 mg via ORAL
  Filled 2013-07-15 (×5): qty 1

## 2013-07-15 MED ORDER — FAMOTIDINE 20 MG PO TABS
20.0000 mg | ORAL_TABLET | Freq: Every day | ORAL | Status: DC
  Administered 2013-07-15 – 2013-07-18 (×4): 20 mg via ORAL
  Filled 2013-07-15 (×5): qty 1

## 2013-07-15 MED ORDER — DIPHENHYDRAMINE HCL 12.5 MG/5ML PO ELIX: 12.5000 mg | ORAL_SOLUTION | ORAL | Status: DC | PRN

## 2013-07-15 MED ORDER — ROPIVACAINE HCL 5 MG/ML IJ SOLN
INTRAMUSCULAR | Status: AC
  Filled 2013-07-15: qty 30

## 2013-07-15 MED ORDER — BISACODYL 5 MG PO TBEC: 5.0000 mg | DELAYED_RELEASE_TABLET | Freq: Every day | ORAL | Status: DC | PRN

## 2013-07-15 MED ORDER — LIDOCAINE HCL (CARDIAC) 20 MG/ML IV SOLN
INTRAVENOUS | Status: DC | PRN
  Administered 2013-07-15: 100 mg via INTRAVENOUS

## 2013-07-15 MED ORDER — ATORVASTATIN CALCIUM 80 MG PO TABS
80.0000 mg | ORAL_TABLET | Freq: Every day | ORAL | Status: DC
  Administered 2013-07-15 – 2013-07-18 (×4): 80 mg via ORAL
  Filled 2013-07-15 (×5): qty 1

## 2013-07-15 MED ORDER — DEXTROSE 5 % IV SOLN
3.0000 g | INTRAVENOUS | Status: AC
  Administered 2013-07-15: 3 g via INTRAVENOUS
  Filled 2013-07-15: qty 3000

## 2013-07-15 MED ORDER — OXYCODONE HCL 5 MG PO TABS
5.0000 mg | ORAL_TABLET | ORAL | Status: DC | PRN
  Administered 2013-07-15 – 2013-07-17 (×5): 10 mg via ORAL
  Administered 2013-07-18: 5 mg via ORAL
  Filled 2013-07-15: qty 1
  Filled 2013-07-15 (×5): qty 2

## 2013-07-15 MED ORDER — METOCLOPRAMIDE HCL 5 MG/ML IJ SOLN: 5.0000 mg | Freq: Three times a day (TID) | INTRAMUSCULAR | Status: DC | PRN

## 2013-07-15 MED ORDER — ROPIVACAINE HCL 2 MG/ML IJ SOLN
INTRAMUSCULAR | Status: DC | PRN
  Administered 2013-07-15: 30 mL via EPIDURAL

## 2013-07-15 MED ORDER — INSULIN ASPART 100 UNIT/ML ~~LOC~~ SOLN
0.0000 [IU] | Freq: Three times a day (TID) | SUBCUTANEOUS | Status: DC
  Administered 2013-07-15: 4 [IU] via SUBCUTANEOUS
  Administered 2013-07-16: 3 [IU] via SUBCUTANEOUS
  Administered 2013-07-16: 4 [IU] via SUBCUTANEOUS
  Administered 2013-07-16: 3 [IU] via SUBCUTANEOUS
  Administered 2013-07-17 (×2): 4 [IU] via SUBCUTANEOUS
  Administered 2013-07-17 – 2013-07-19 (×3): 3 [IU] via SUBCUTANEOUS

## 2013-07-15 MED ORDER — FUROSEMIDE 40 MG PO TABS
40.0000 mg | ORAL_TABLET | Freq: Two times a day (BID) | ORAL | Status: DC
  Administered 2013-07-15 – 2013-07-19 (×8): 40 mg via ORAL
  Filled 2013-07-15 (×10): qty 1

## 2013-07-15 MED ORDER — GLIPIZIDE 5 MG PO TABS
5.0000 mg | ORAL_TABLET | Freq: Every day | ORAL | Status: DC
  Administered 2013-07-16 – 2013-07-19 (×4): 5 mg via ORAL
  Filled 2013-07-15 (×5): qty 1

## 2013-07-15 MED ORDER — ACETAMINOPHEN 10 MG/ML IV SOLN
1000.0000 mg | Freq: Once | INTRAVENOUS | Status: AC | PRN
  Administered 2013-07-15: 1000 mg via INTRAVENOUS
  Filled 2013-07-15 (×3): qty 100

## 2013-07-15 MED ORDER — HYDROCHLOROTHIAZIDE 25 MG PO TABS
25.0000 mg | ORAL_TABLET | Freq: Every morning | ORAL | Status: DC
  Administered 2013-07-16 – 2013-07-19 (×4): 25 mg via ORAL
  Filled 2013-07-15 (×4): qty 1

## 2013-07-15 MED ORDER — MEPERIDINE HCL 50 MG/ML IJ SOLN: 6.2500 mg | INTRAMUSCULAR | Status: DC | PRN

## 2013-07-15 MED ORDER — INSULIN ASPART 100 UNIT/ML ~~LOC~~ SOLN: 0.0000 [IU] | Freq: Every day | SUBCUTANEOUS | Status: DC

## 2013-07-15 MED ORDER — CEFAZOLIN SODIUM 1-5 GM-% IV SOLN
INTRAVENOUS | Status: AC
  Filled 2013-07-15: qty 50

## 2013-07-15 MED ORDER — ONDANSETRON HCL 4 MG/2ML IJ SOLN
INTRAMUSCULAR | Status: DC | PRN
  Administered 2013-07-15: 4 mg via INTRAVENOUS

## 2013-07-15 MED ORDER — CEFAZOLIN SODIUM-DEXTROSE 2-3 GM-% IV SOLR
INTRAVENOUS | Status: AC
  Filled 2013-07-15: qty 50

## 2013-07-15 MED ORDER — METOCLOPRAMIDE HCL 10 MG PO TABS
5.0000 mg | ORAL_TABLET | Freq: Three times a day (TID) | ORAL | Status: DC | PRN
  Administered 2013-07-17 – 2013-07-18 (×2): 10 mg via ORAL
  Filled 2013-07-15 (×2): qty 1

## 2013-07-15 SURGICAL SUPPLY — 67 items
ADAPTER BOLT FEMORAL +2/-2 (Knees) ×2 IMPLANT
BAG ZIPLOCK 12X15 (MISCELLANEOUS) ×2 IMPLANT
BANDAGE ELASTIC 4 VELCRO ST LF (GAUZE/BANDAGES/DRESSINGS) IMPLANT
BANDAGE ELASTIC 6 VELCRO ST LF (GAUZE/BANDAGES/DRESSINGS) IMPLANT
BANDAGE ESMARK 6X9 LF (GAUZE/BANDAGES/DRESSINGS) ×1 IMPLANT
BLADE SAG 18X100X1.27 (BLADE) ×2 IMPLANT
BLADE SAW SGTL 13.0X1.19X90.0M (BLADE) ×2 IMPLANT
BLADE SURG SZ10 CARB STEEL (BLADE) ×2 IMPLANT
BNDG ELASTIC 6X10 VLCR STRL LF (GAUZE/BANDAGES/DRESSINGS) ×4 IMPLANT
BNDG ESMARK 6X9 LF (GAUZE/BANDAGES/DRESSINGS) ×2
CEMENT HV SMART SET (Cement) ×4 IMPLANT
CEMENT RESTRICTOR DEPUY SZ 4 (Cement) ×2 IMPLANT
CLOTH BEACON ORANGE TIMEOUT ST (SAFETY) ×2 IMPLANT
CONT SPECI 4OZ STER CLIK (MISCELLANEOUS) ×2 IMPLANT
CUFF TOURN SGL QUICK 34 (TOURNIQUET CUFF) ×1
CUFF TRNQT CYL 34X4X40X1 (TOURNIQUET CUFF) ×1 IMPLANT
DERMABOND ADVANCED (GAUZE/BANDAGES/DRESSINGS)
DERMABOND ADVANCED .7 DNX12 (GAUZE/BANDAGES/DRESSINGS) IMPLANT
DRAPE EXTREMITY T 121X128X90 (DRAPE) ×2 IMPLANT
DRAPE POUCH INSTRU U-SHP 10X18 (DRAPES) ×2 IMPLANT
DRAPE U-SHAPE 47X51 STRL (DRAPES) ×2 IMPLANT
DRSG AQUACEL AG ADV 3.5X10 (GAUZE/BANDAGES/DRESSINGS) IMPLANT
DRSG PAD ABDOMINAL 8X10 ST (GAUZE/BANDAGES/DRESSINGS) ×4 IMPLANT
DRSG TEGADERM 4X4.75 (GAUZE/BANDAGES/DRESSINGS) IMPLANT
DURAPREP 26ML APPLICATOR (WOUND CARE) ×2 IMPLANT
ELECT REM PT RETURN 9FT ADLT (ELECTROSURGICAL) ×2
ELECTRODE REM PT RTRN 9FT ADLT (ELECTROSURGICAL) ×1 IMPLANT
EVACUATOR 1/8 PVC DRAIN (DRAIN) ×2 IMPLANT
FACESHIELD LNG OPTICON STERILE (SAFETY) ×10 IMPLANT
FEM TC3 RT PFC SIGMA SZ2.5 (Orthopedic Implant) ×2 IMPLANT
FEMORAL ADAPTER (Orthopedic Implant) ×2 IMPLANT
FEMORAL TC3 RT PFC SIGMA SZ2.5 (Orthopedic Implant) ×1 IMPLANT
GAUZE SPONGE 2X2 8PLY STRL LF (GAUZE/BANDAGES/DRESSINGS) IMPLANT
GAUZE XEROFORM 5X9 LF (GAUZE/BANDAGES/DRESSINGS) ×2 IMPLANT
GLOVE BIO SURGEON STRL SZ7.5 (GLOVE) ×4 IMPLANT
GLOVE BIOGEL PI IND STRL 8 (GLOVE) ×2 IMPLANT
GLOVE BIOGEL PI INDICATOR 8 (GLOVE) ×2
GLOVE ECLIPSE 8.0 STRL XLNG CF (GLOVE) ×2 IMPLANT
GOWN STRL REIN XL XLG (GOWN DISPOSABLE) ×2 IMPLANT
HANDPIECE INTERPULSE COAX TIP (DISPOSABLE) ×1
IMMOBILIZER KNEE 20 (SOFTGOODS) ×2
IMMOBILIZER KNEE 20 THIGH 36 (SOFTGOODS) ×1 IMPLANT
INSERT TC3 RP TIBIAL 2.5 20MM (Knees) ×2 IMPLANT
KIT BASIN OR (CUSTOM PROCEDURE TRAY) ×2 IMPLANT
NS IRRIG 1000ML POUR BTL (IV SOLUTION) ×2 IMPLANT
PACK TOTAL JOINT (CUSTOM PROCEDURE TRAY) ×2 IMPLANT
PADDING CAST COTTON 6X4 STRL (CAST SUPPLIES) ×4 IMPLANT
POSITIONER SURGICAL ARM (MISCELLANEOUS) ×2 IMPLANT
SET HNDPC FAN SPRY TIP SCT (DISPOSABLE) ×1 IMPLANT
SET PAD KNEE POSITIONER (MISCELLANEOUS) ×2 IMPLANT
SPONGE GAUZE 2X2 STER 10/PKG (GAUZE/BANDAGES/DRESSINGS)
SPONGE GAUZE 4X4 12PLY (GAUZE/BANDAGES/DRESSINGS) ×2 IMPLANT
SPONGE LAP 18X18 X RAY DECT (DISPOSABLE) ×2 IMPLANT
STAPLER VISISTAT 35W (STAPLE) ×2 IMPLANT
STEM TIBIA PFC 13X30MM (Stem) ×2 IMPLANT
SUCTION FRAZIER 12FR DISP (SUCTIONS) ×2 IMPLANT
SUT VIC AB 0 CT1 36 (SUTURE) ×4 IMPLANT
SUT VIC AB 1 CT1 36 (SUTURE) ×4 IMPLANT
SUT VIC AB 2-0 CT1 27 (SUTURE) ×2
SUT VIC AB 2-0 CT1 TAPERPNT 27 (SUTURE) ×2 IMPLANT
SUT VICRYL 4-0 (SUTURE) ×2 IMPLANT
SWAB COLLECTION DEVICE MRSA (MISCELLANEOUS) ×2 IMPLANT
TOWEL OR 17X26 10 PK STRL BLUE (TOWEL DISPOSABLE) ×6 IMPLANT
TOWER CARTRIDGE SMART MIX (DISPOSABLE) ×2 IMPLANT
TRAY FOLEY CATH 14FRSI W/METER (CATHETERS) ×2 IMPLANT
TUBE ANAEROBIC SPECIMEN COL (MISCELLANEOUS) ×2 IMPLANT
WATER STERILE IRR 1500ML POUR (IV SOLUTION) ×2 IMPLANT

## 2013-07-15 NOTE — Anesthesia Preprocedure Evaluation (Addendum)
Anesthesia Evaluation  Patient identified by MRN, date of birth, ID band Patient awake    Reviewed: Allergy & Precautions, H&P , NPO status , Patient's Chart, lab work & pertinent test results  Airway Mallampati: II TM Distance: >3 FB Neck ROM: Full    Dental  (+) Teeth Intact and Dental Advisory Given   Pulmonary shortness of breath and with exertion, asthma , sleep apnea , pneumonia -, resolved, PE Hx of PE breath sounds clear to auscultation  Pulmonary exam normal       Cardiovascular Exercise Tolerance: Poor hypertension, Pt. on medications + CAD, + Past MI and + Cardiac Stents - CHF and - DVT + Valvular Problems/Murmurs AS Rhythm:Regular Rate:Normal  Echo 02/2013 - Left ventricle: Systolic function was normal. The   estimated ejection fraction was in the range of 60% to   65%. Wall motion was normal; there were no regional wall motion abnormalities. - Aortic valve: There was moderate stenosis. Mild   regurgitation. - Mitral valve: No evidence of vegetation. - Left atrium: The atrium was mildly dilated. No evidence of  thrombus in the atrial cavity or appendage. - Atrial septum: Doppler showed no right-to-left atrial   level shunt. - Tricuspid valve: No evidence of vegetation.    Neuro/Psych  Headaches, PSYCHIATRIC DISORDERS Depression    GI/Hepatic Neg liver ROS, GERD-  Medicated,  Endo/Other  diabetes, Poorly Controlled, Type 2, Oral Hypoglycemic AgentsMorbid obesitySuper morbid obesity  Renal/GU Renal Insufficiency and CRFRenal disease     Musculoskeletal  (+) Arthritis -, Osteoarthritis,    Abdominal (+) + obese,   Peds  Hematology  (+) Blood dyscrasia, anemia ,   Anesthesia Other Findings   Reproductive/Obstetrics                          Anesthesia Physical  Anesthesia Plan  ASA: IV  Anesthesia Plan: General and Regional   Post-op Pain Management:    Induction:  Intravenous  Airway Management Planned: Oral ETT  Additional Equipment:   Intra-op Plan:   Post-operative Plan: Extubation in OR  Informed Consent: I have reviewed the patients History and Physical, chart, labs and discussed the procedure including the risks, benefits and alternatives for the proposed anesthesia with the patient or authorized representative who has indicated his/her understanding and acceptance.   Dental advisory given  Plan Discussed with: CRNA and Surgeon  Anesthesia Plan Comments:        Anesthesia Quick Evaluation

## 2013-07-15 NOTE — Anesthesia Postprocedure Evaluation (Signed)
Anesthesia Post Note  Patient: [name]  Procedure(s) Performed: Procedure(s) (LRB): REVISION ARTHROPLASTY RIGHT KNEE (Right)  Anesthesia type: General  Patient location: PACU  Post pain: Pain level controlled  Post assessment: Post-op Vital signs reviewed  Last Vitals: BP 137/72  Pulse 105  Temp(Src) 36.6 C (Oral)  Resp 16  SpO2 92%  LMP 07/26/2009  Post vital signs: Reviewed  Level of consciousness: sedated  Complications: No apparent anesthesia complications

## 2013-07-15 NOTE — Transfer of Care (Signed)
Immediate Anesthesia Transfer of Care Note  Patient: [name]  Procedure(s) Performed: Procedure(s): REVISION ARTHROPLASTY RIGHT KNEE (Right)  Patient Location: PACU  Anesthesia Type:General  Level of Consciousness: sedated  Airway & Oxygen Therapy: Patient Spontanous Breathing and Patient connected to face mask oxygen  Post-op Assessment: Report given to PACU RN and Post -op Vital signs reviewed and stable  Post vital signs: Reviewed and stable  Complications: No apparent anesthesia complications

## 2013-07-15 NOTE — H&P (Signed)
TOTAL KNEE REVISION ADMISSION H&P  Patient is being admitted for right revision total knee arthroplasty.  Subjective:  Chief Complaint:right knee pain.  HPI: [name], 52 y.o. female, has a history of pain and functional disability in the right knee(s) due to failed previous arthroplasty and patient has failed non-surgical conservative treatments for greater than 12 weeks to include flexibility and strengthening excercises, use of assistive devices, weight reduction as appropriate, activity modification and a knee brace. 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 Onset of symptoms was abrupt starting 1 years ago with gradually worsening course since that time.  Prior procedures on the right knee(s) include arthroplasty.  Patient currently rates pain in the right knee(s) at 4 out of 10 with activity. There is laxity of her right knee on exam. This condition presents safety issues increasing the risk of falls. There is no current active infection.  Patient Active Problem List   Diagnosis Date Noted  . 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 Abnormal CT scan, chest 05/12/2013  . Hyperlipidemia 11/30/2012  . Degenerative arthritis of left knee 09/10/2012  . Pulmonary embolism 07/09/2012  .   04/08/2012  . Encounter for long-term (current) use of anticoagulants 02/20/2012  . Hematuria 02/15/2012  . PE (pulmonary embolism) 02/08/2012  . HAP (hospital-acquired pneumonia) 02/08/2012  . Anemia 02/08/2012  . DM (diabetes mellitus) 02/08/2012  . Asthma 02/08/2012  . Hypokalemia 02/08/2012  . Hypertension   . GERD (gastroesophageal reflux disease)   . Degenerative arthritis of right knee 02/03/2012  . CAD (coronary artery disease) 12/08/2011  . Dyspnea 12/08/2011   Past Medical History  Diagnosis Date  . Asthma   . Hypertension   . Hyperlipidemia   . GERD (gastroesophageal reflux  disease)   . Depression   . Aortic stenosis, moderate     per 11/2011 echo  . Blood transfusion     2013[facility]  . Headache(784.0)     h/o migraines  . Pulmonary embolism 02/08/12    S/P RT TOTAL KNEE ON 02/03/12--ON 02/08/12--DEVELOPED ACUTE SOB AND CHEST PAIN--AND DIAGNOSED WITH  PULMONARY EMBOLUS AND PNEUMONIA  . Restless leg syndrome   . Myocardial infarction     PT THINKS SHE WAS DX WITH MI AT THE TIME OF HEART STENTING  . CAD (coronary artery disease)     Cath 2010 with DES x 1 RCA-- PT'S CARDIOLOGIST IS DR. MCALHANY  . Uterine fibroid     NO PROBLEMS AT PRESENT FROM THE FIBROIDS-STATES SHE IS POST MENOPAUSAL-LAST MENSES 2010 EXCEPT FOR EPISODE THIS YR OF BLEEDING RELATED TO FIBROIDS.  Marland Kitchen Heart murmur   . Pneumonia 02/08/12    HOSPITALIZED AT [facility] WITH PNEUMONIA AND WITH PULMONARY EMBOLUS  . Diabetes mellitus DIAGNOSED IN2010    ORAL MEDS  . Arthritis     PAIN AND SEVERE OA LEFT KNEE ; S/P RIGHT TKA ON 02/03/12; HAS LOWER BACK PAIN-UNABLE TO STAND MORE THAN 10 MIN; ARTHRITIS "ALL OVER"  . Neck pain   . Weakness     BOTH HANDS - S/P BILATERAL CARPAL TUNNEL RELEASE--BUT STILL HAS WEAKNESS--OFTEN DROPS THINGS  . Shortness of breath     with exertion  . Eczema     on back  . Kidney stones   . Anemia     Past Surgical History  Procedure Laterality Date  . Tubal ligation    . Carpal tunnel  release      Bilateral  . Cholecystectomy    . Hernia repair    . Cardiac catheterization    . Knee arthroplasty  02/03/2012    Procedure: COMPUTER ASSISTED TOTAL KNEE ARTHROPLASTY;  Surgeon: [name], MD;  Location: [facility] OR;  Service: Orthopedics;  Laterality: Right;  Right total knee arthroplasty  . Coronary angioplasty      2010 has stent in place  . Total knee arthroplasty  09/10/2012    Procedure: TOTAL KNEE ARTHROPLASTY;  Surgeon: [name], MD;  Location: [facility] ORS;  Service: Orthopedics;  Laterality: Left;  . Trigger finger release  09/10/2012    Procedure: RELEASE  TRIGGER FINGER/A-1 PULLEY;  Surgeon: [name], MD;  Location: [facility] ORS;  Service: Orthopedics;  Laterality: Right;  Right Ring Finger  . Tee without cardioversion N/A 03/14/2013    Procedure: TRANSESOPHAGEAL ECHOCARDIOGRAM (TEE);  Surgeon: Lewayne Bunting, MD;  Location: [facility] ENDOSCOPY;  Service: Cardiovascular;  Laterality: N/A;  . Joint replacement      bil total knees    No prescriptions prior to admission   No Known Allergies  History  Substance Use Topics  . Smoking status: Former Smoker -- 1.50 packs/day for 30 years    Types: Cigarettes    Quit date: 12/29/2000  . Smokeless tobacco: Not on file  . Alcohol Use: No    Family History  Problem Relation Age of Onset  . Breast cancer Mother   . COPD Father     smoked  . Cancer Brother     Sinus  . Emphysema Mother     smoked  . Asthma Father   . Heart disease Mother   . Heart disease Father       Review of Systems  All other systems reviewed and are negative.     Objective:  Physical Exam  Constitutional: She is oriented to person, place, and time. She appears well-developed and well-nourished.  HENT:  Head: Normocephalic and atraumatic.  Eyes: Conjunctivae are normal. Pupils are equal, round, and reactive to light.  Neck: Normal range of motion.  Cardiovascular: Normal rate and regular rhythm.   Respiratory: Effort normal and breath sounds normal.  GI: Soft. Bowel sounds are normal.  Musculoskeletal:       Right knee: She exhibits MCL laxity.  Neurological: She is alert and oriented to person, place, and time.  Skin: Skin is warm and dry.  Psychiatric: She has a normal mood and affect.    Vital signs in last 24 hours:    Labs:  Estimated body mass index is 58.56 kg/(m^2) as calculated from the following:   Height as of 05/11/13: 5\' 3"  (1.6 m).   Weight as of 05/12/13: 149.914 kg (330 lb 8 oz).  Imaging Review Plain radiographs demonstrate good alignment of the prosthesis with no evidence of  loosening.  Assessment/Plan:  Unstable right knee with a primary total knee replacement due to a MCL injury.  The patient history, physical examination, clinical judgment of the provider and imaging studies are consistent with end stage degenerative joint disease of the right knee(s), previous total knee arthroplasty. Revision total knee arthroplasty is deemed medically necessary. The treatment options including medical management, injection therapy, arthroscopy and revision arthroplasty were discussed at length. The risks and benefits of revision total knee arthroplasty were presented and reviewed. The risks due to aseptic loosening, infection, stiffness, patella tracking problems, thromboembolic complications and other imponderables were discussed. The patient acknowledged the explanation, agreed  to proceed with the plan and consent was signed. Patient is being admitted for inpatient treatment for surgery, pain control, PT, OT, prophylactic antibiotics, VTE prophylaxis, progressive ambulation and ADL's and discharge planning.The patient is planning to be discharged home with home health services

## 2013-07-15 NOTE — Brief Op Note (Signed)
07/15/2013  2:13 PM  PATIENT:  [name]  52 y.o. female  PRE-OPERATIVE DIAGNOSIS:  Failed right total knee arthroplasty  POST-OPERATIVE DIAGNOSIS:  Failed right total knee arthroplasty  PROCEDURE:  Procedure(s): REVISION ARTHROPLASTY RIGHT KNEE (Right)  SURGEON:  Surgeon(s) and Role:    * [name], MD - Primary  PHYSICIAN ASSISTANT: Rexene Edison, PA-C  ANESTHESIA:   regional and general  EBL:  Total I/O In: 2000 [I.V.:2000] Out: 300 [Urine:100; Blood:200]  BLOOD ADMINISTERED:none  DRAINS: (medium) Hemovact drain(s) in the knee joint with  Suction Open   LOCAL MEDICATIONS USED:  NONE  SPECIMEN:  No Specimen  DISPOSITION OF SPECIMEN:  N/A  COUNTS:  YES  TOURNIQUET:   Total Tourniquet Time Documented: Thigh (Right) - -[id number] minutes Total: Thigh (Right) - -045409 minutes   DICTATION: .Other Dictation: Dictation Number [id number]  PLAN OF CARE: Admit to inpatient   PATIENT DISPOSITION:  PACU - hemodynamically stable.   Delay start of Pharmacological VTE agent (>24hrs) due to surgical blood loss or risk of bleeding: no

## 2013-07-15 NOTE — Anesthesia Procedure Notes (Signed)
Anesthesia Regional Block:  Femoral nerve block  Pre-Anesthetic Checklist: ,, timeout performed, Correct Patient, Correct Site, Correct Laterality, Correct Procedure, Correct Position, site marked, Risks and benefits discussed,  Surgical consent,  Pre-op evaluation,  At surgeon's request and post-op pain management  Laterality: Right  Prep: chloraprep       Needles:  Injection technique: Single-shot  Needle Type: Stimulator Needle - 80     Needle Length: 10cm 10 cm Needle Gauge: 22 and 22 G  Needle insertion depth: 6 cm   Additional Needles:  Procedures: ultrasound guided (picture in chart) and nerve stimulator Femoral nerve block  Nerve Stimulator or Paresthesia:  Response: Twitch elicited,, 0.8 mA,   Additional Responses:   Narrative:  Start time: 07/15/2013 11:15 AM End time: 07/15/2013 11:40 AM Injection made incrementally with aspirations every 5 mL.  Performed by: Personally  Anesthesiologist: Renold Don, MD  Additional Notes: BP cuff, EKG monitors applied. Sedation begun. Direct U/S guidance for nerve location. Anesthetic injected incrementally, slowly , and after neg aspirations. Tolerated well. Very difficult block. Tissues poorly defined and poor U/S images due to depth of tissue. Consistently demonstrated sartorius twitch which allowed possible nerve location. Discussed with patient that block may not work.  Femoral nerve block

## 2013-07-16 LAB — BASIC METABOLIC PANEL
BUN: 18 mg/dL (ref 6–23)
Calcium: 8.7 mg/dL (ref 8.4–10.5)
Creatinine, Ser: 0.82 mg/dL (ref 0.50–1.10)
GFR calc Af Amer: >90 mL/min (ref >90)

## 2013-07-16 LAB — CBC WITH DIFFERENTIAL/PLATELET
Basophils Absolute: 0 10*3/uL (ref 0.0–0.1)
Eosinophils Absolute: 0 10*3/uL (ref 0.0–0.7)
Lymphs Abs: 1.4 10*3/uL (ref 0.7–4.0)
MCH: 27.1 pg (ref 26.0–34.0)
MCHC: 32.4 g/dL (ref 30.0–36.0)
MCV: 83.8 fL (ref 78.0–100.0)
Monocytes Absolute: 0.9 10*3/uL (ref 0.1–1.0)
Neutro Abs: 10.4 10*3/uL — ABNORMAL HIGH (ref 1.7–7.7)
Platelets: 273 10*3/uL (ref 150–400)
RDW: 14.4 % (ref 11.5–15.5)
WBC Morphology: INCREASED

## 2013-07-16 LAB — GLUCOSE, CAPILLARY: Glucose-Capillary: 144 mg/dL — ABNORMAL HIGH (ref 70–99)

## 2013-07-16 NOTE — Evaluation (Signed)
Physical Therapy Evaluation Patient Details Name: [name] MRN: [id number] DOB: 11-Jul-1961 Today's Date: 07/16/2013 Time: 0102-7253 PT Time Calculation (min): 32 min  PT Assessment / Plan / Recommendation History of Present Illness     Clinical Impression  Pt s/p R TKR revision presents with functional mobility limitations 2* decreased R LE strength/ROM, post op pain and morbid obesity.    PT Assessment  Patient needs continued PT services    Follow Up Recommendations  Home health PT;SNF (dependent on acute stay progress and level of assist at home)    Does the patient have the potential to tolerate intense rehabilitation      Barriers to Discharge Decreased caregiver support Pt states husband works days (was not working during previous surgeries)    Equipment Recommendations  None recommended by PT    Recommendations for [employer] OT consult   Frequency 7X/week    Precautions / Restrictions Precautions Precautions: Knee;Fall Restrictions Weight Bearing Restrictions: No Other Position/Activity Restrictions: WBAT   Pertinent Vitals/Pain 5/10; premed, cold pack provided      Mobility  Bed Mobility Bed Mobility: Supine to Sit Supine to Sit: 1: +2 Total assist;With rails;HOB elevated Supine to Sit: Patient Percentage: 60% Details for Bed Mobility Assistance: cues for sequence and use of L LE to self assist. Pt utilizing rail to half roll and swing legs over edge to bring body upright Transfers Transfers: Sit to Stand;Stand to Sit Sit to Stand: 1: +2 Total assist Sit to Stand: Patient Percentage: 60% Stand to Sit: 1: +2 Total assist Stand to Sit: Patient Percentage: 70% Details for Transfer Assistance: cues for LE management and use of UEs to self assist Ambulation/Gait Ambulation/Gait Assistance: 1: +2 Total assist Ambulation/Gait: Patient Percentage: 70% Ambulation Distance (Feet): 33 Feet Assistive device: Rolling walker Ambulation/Gait  Assistance Details: cues for sequence, posture and position from RW Gait Pattern: Step-to pattern;Decreased step length - right;Decreased step length - left;Antalgic;Trunk flexed Stairs: No    Exercises Total Joint Exercises Ankle Circles/Pumps: AROM;10 reps;Supine;Both Quad Sets: AROM;10 reps;Supine;Both Heel Slides: AAROM;10 reps;Supine;Right Straight Leg Raises: AAROM;10 reps;Supine;Right   PT Diagnosis: Difficulty walking  PT Problem List: Decreased strength;Decreased range of motion;Decreased activity tolerance;Decreased mobility;Decreased knowledge of use of DME;Obesity;Pain PT Treatment Interventions: DME instruction;Gait training;Stair training;Functional mobility training;Therapeutic activities;Therapeutic exercise;Patient/family education     PT Goals(Current goals can be found in the care plan section) Acute Rehab PT Goals Patient Stated Goal: Resume previous lifestyle with increased knee stability PT Goal Formulation: With patient Time For Goal Achievement: 07/23/13 Potential to Achieve Goals: Good  Visit Information  Last PT Received On: 07/16/13 Assistance Needed: +2       Prior Functioning  Home Living Family/patient expects to be discharged to:: Private residence Living Arrangements: Spouse/significant other Available Help at Discharge: Family Type of Home: House Home Access: Stairs to enter Secretary/administrator of Steps: 2 Entrance Stairs-Rails: Right Home Layout: One level Home Equipment: Environmental consultant - 2 wheels;Crutches Prior Function Level of Independence: Independent Comments: admits to falls at home "my knee would give out" Communication Communication: No difficulties    Cognition  Cognition Arousal/Alertness: Awake/alert Behavior During Therapy: WFL for tasks assessed/performed Overall Cognitive Status: Within Functional Limits for tasks assessed    Extremity/Trunk Assessment Upper Extremity Assessment Upper Extremity Assessment: Overall WFL for  tasks assessed Lower Extremity Assessment Lower Extremity Assessment: RLE deficits/detail RLE Deficits / Details: 2+/5 quads with AAROM at knee -10 - 35   Balance    End of Session PT - End  of Session Equipment Utilized During Treatment: Gait belt;Right knee immobilizer Activity Tolerance: Patient tolerated treatment well Patient left: in chair;with call bell/phone within reach Nurse Communication: Mobility status  GP     Malaisha Silliman 07/16/2013, 12:56 PM

## 2013-07-16 NOTE — Progress Notes (Signed)
Physical Therapy Treatment Patient Details Name: KAMYLLE AXELSON MRN: [id number] DOB: November 28, 1961 Today's Date: 07/16/2013 Time: 7846-9629 PT Time Calculation (min): 32 min  PT Assessment / Plan / Recommendation  PT Comments     Follow Up Recommendations  Home health PT;SNF     Does the patient have the potential to tolerate intense rehabilitation     Barriers to Discharge        Equipment Recommendations  None recommended by PT    Recommendations for Other Services OT consult  Frequency 7X/week   Progress towards PT Goals Progress towards PT goals: Progressing toward goals  Plan      Precautions / Restrictions Precautions Precautions: Knee;Fall Restrictions Weight Bearing Restrictions: No Other Position/Activity Restrictions: WBAT   Pertinent Vitals/Pain 4/10; ice packs provided; RN aware    Mobility  Bed Mobility Bed Mobility: Sit to Supine Sit to Supine: 1: +2 Total assist Sit to Supine: Patient Percentage: 60% Details for Bed Mobility Assistance: cues for sequence and use of L LE to self assist. Pt utilizing rail to half roll and swing legs over edge to bring body upright Transfers Transfers: Sit to Stand;Stand to Sit Sit to Stand: 1: +2 Total assist Sit to Stand: Patient Percentage: 60% Stand to Sit: 1: +2 Total assist Stand to Sit: Patient Percentage: 70% Details for Transfer Assistance: cues for LE management and use of UEs to self assist Ambulation/Gait Ambulation/Gait Assistance: 1: +2 Total assist Ambulation/Gait: Patient Percentage: 70% Ambulation Distance (Feet): 45 Feet (twice) Assistive device: Rolling walker Ambulation/Gait Assistance Details: cues for posture and position from RW Gait Pattern: Step-to pattern;Decreased step length - right;Decreased step length - left;Antalgic;Trunk flexed General Gait Details: multiple standing rests required for task completion Stairs: No    Exercises     PT Diagnosis:    PT Problem List:   PT Treatment  Interventions:     PT Goals (current goals can now be found in the care plan section) Acute Rehab PT Goals Patient Stated Goal: Resume previous lifestyle with increased knee stability PT Goal Formulation: With patient Time For Goal Achievement: 07/23/13 Potential to Achieve Goals: Good  Visit Information  Last PT Received On: 07/16/13 Assistance Needed: +2    Subjective Data  Patient Stated Goal: Resume previous lifestyle with increased knee stability   Cognition  Cognition Arousal/Alertness: Awake/alert Behavior During Therapy: WFL for tasks assessed/performed Overall Cognitive Status: Within Functional Limits for tasks assessed    Balance     End of Session PT - End of Session Equipment Utilized During Treatment: Gait belt;Right knee immobilizer;Oxygen Activity Tolerance: Patient tolerated treatment well Patient left: with call bell/phone within reach;in bed Nurse Communication: Mobility status   GP     Janyiah Silveri 07/16/2013, 4:25 PM

## 2013-07-16 NOTE — Progress Notes (Signed)
Patient ID: [name], female   DOB: May 07, 1961, 52 y.o.   MRN: [id number] Postoperative day one revision right total knee arthroplasty. Patient complains of pain this morning. Physical therapy progressive ambulation weightbearing as tolerated.

## 2013-07-16 NOTE — Op Note (Signed)
NAMESHERAY, GRIST NO.:  [id number]  MEDICAL RECORD NO.:  [id number]  LOCATION:  1617                         FACILITY:  [facility]  PHYSICIAN:  Vanita Panda. Magnus Ivan, M.D.DATE OF BIRTH:  1961-10-04  DATE OF PROCEDURE:  07/15/2013 DATE OF DISCHARGE:                              OPERATIVE REPORT   PREOPERATIVE DIAGNOSIS:  Right knee instability several years after a total knee arthroplasty with incompetent knee ligaments.  POSTOPERATIVE DIAGNOSIS:  Right knee instability several years after a total knee arthroplasty with incompetent knee ligaments.  PROCEDURE:  Revision of right knee femoral component and placement of a constrained polyethylene liner.  IMPLANTS:  DePuy TC3 femur size 2.5 with 20 mm polyethylene insert.  SURGEON:  Vanita Panda. Magnus Ivan, M.D.  ASSISTANT:  Richardean Canal, P.A.  ANESTHESIA: 1. Regional right femoral block. 2. General.  BLOOD LOSS:  300 mL.  ANTIBIOTICS:  2 g IV Ancef.  COMPLICATIONS:  None.  INDICATIONS:  Ms. Redmon is a very pleasant 52 year old morbidly obese female, who has had bilateral total knee arthroplasties at different times.  She had done quite well with both of her knees, and then late last year, she sustained a mechanical fall injuring her medial collateral ligament.  We treated her in a hinged knee brace and tried to get this to heal, but she was experiencing some subluxation of the implants from her total knee replacement.  We had already performed her primary knee replacement with a revision tibial tray and a standard femur, and she had a 15 mm polyethylene insert.  Given the instability of her knee, we recommended a revision to a revision of femur with thicker polyethylene to give her stability.  I think her morbid obesity plays a role in this as well, and she understands with the incompetence of her ligaments without this surgery she will continue to remain unstable, and she has had some recurrent  falls as well.  She understands fully the risks of surgery including acute blood loss anemia, nerve, and vessel injury, DVT, PE, and infection.  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  PROCEDURE DESCRIPTION:  After informed consent was obtained, the appropriate right leg was marked.  Anesthesia obtained with a femoral nerve block.  She was then brought to the operating room, placed supine on the operating table.  General anesthesia was then obtained.  A Foley catheter was placed, and a nonsterile tourniquet placed around her upper right thigh.  Her right leg was then prepped and draped with DuraPrep and sterile drapes including a sterile stockinette.  Time-out was called, and she was identified as correct patient, correct right knee. We then used an Esmarch wrap on the leg and tourniquet was inflated to 300 mmHg of pressure.  We made a midline incision over the knee and carried this proximally and distally through her old incision.  We dissected down to the knee joint and performed a medial parapatellar arthrotomy.  We did not find any effusion in the knee at all.  Then, I assessed the knee and did find the medial collateral ligament to be incompetent as well as the knee easily subluxating.  I removed the polyethylene insert and assessed the components, and  neither component was loose.  However given the instability, further we need to proceed with femoral revision to allow [place] for more constrained poly.  We then meticulously removed the femur and any remnants of cement.  We opened up the femoral canal with initiating reamer and then hand reamed up to a size 14 for placing a 13 stem.  We then placed our 2.5 mm size cutting block for making our distal femoral refreshening cuts.  We then used a 4 in 1 cutting block for our anterior and posterior cuts and our chamfer cuts, again all of which was refreshening bone.  We then trialed a 13 x 30 mm stem on the 2.5 insert and placed this on the  femur, and then we trialed several polys, and she was most stable at the 20 mm constrained poly with this knee in place with good motion as well.  We then removed the trial components and copiously irrigated the knee with normal saline solution using pulsatile lavage.  We placed a cement restrictor and cemented the real DePuy TC3 femoral revision component with a stem sized at 2.5 and placed a 20 mm polyethylene insert.  We then let the tourniquet down.  After the cement had dried and hemostasis was obtained with electrocautery, she still oozed quite a bit, so we placed a medium Hemovac in the arthrotomy and closed the arthrotomy with interrupted #1 Vicryl suture followed by 0 Vicryl in the deep tissue, 2-0 Vicryl in the subcutaneous tissue, staples on the skin, and a well-padded dressing. She was then awakened, extubated, and taken to recovery room in stable condition.  All final counts were correct.  There were no complications noted.     Vanita Panda. Magnus Ivan, M.D.     CYB/MEDQ  D:  07/15/2013  T:  07/15/2013  Job:  [id number]

## 2013-07-17 LAB — GLUCOSE, CAPILLARY: Glucose-Capillary: 147 mg/dL — ABNORMAL HIGH (ref 70–99)

## 2013-07-17 NOTE — Progress Notes (Signed)
Occupational Therapy Evaluation Patient Details Name: [name] MRN: [id number] DOB: Oct 26, 1961 Today's Date: 07/17/2013 Time: 0950-1004 OT Time Calculation (min): 14 min  OT Assessment / Plan / Recommendation History of present illness 52 y.o. female s/p R TKA revision   Clinical Impression   Patient presents to OT with decreased ADL independence and safety due to generalized weakness/decreased activity tolerance, pain, decreased mobility, decreased respiratory status.    OT Assessment  Patient needs continued OT Services    Follow Up Recommendations  SNF    Barriers to Discharge Decreased caregiver support husband works during the day  Equipment Recommendations  None recommended by OT    Recommendations for Other Services    Frequency  Min 2X/week    Precautions / Restrictions Precautions Precautions: Knee;Fall Restrictions Weight Bearing Restrictions: No Other Position/Activity Restrictions: WBAT   Pertinent Vitals/Pain     ADL  Eating/Feeding: Performed;Independent Where Assessed - Eating/Feeding: Chair Grooming: Performed;Wash/dry face;Set up Where Assessed - Grooming: Supported sitting Lower Body Bathing: Simulated;+1 Total assistance Where Assessed - Lower Body Bathing: Supported sitting Lower Body Dressing: Simulated;+1 Total assistance Where Assessed - Lower Body Dressing: Supported sitting Transfers/Ambulation Related to ADLs: Patient declined and transfers/ambulation on OT eval due to sleepiness, fatigue, "didn't sleep well" ADL Comments: Patient has difficulty accessing LB for self-care tasks.    OT Diagnosis: Generalized weakness;Acute pain  OT Problem List: Decreased strength;Decreased activity tolerance;Decreased safety awareness;Decreased knowledge of use of DME or AE;Cardiopulmonary status limiting activity;Pain;Obesity OT Treatment Interventions: Self-care/ADL training;DME and/or AE instruction;Therapeutic activities;Patient/family  education;Energy conservation;Therapeutic exercise   OT Goals(Current goals can be found in the care plan section) Acute Rehab OT Goals Patient Stated Goal: Resume previous lifestyle with increased knee stability OT Goal Formulation: With patient Time For Goal Achievement: 07/31/13 Potential to Achieve Goals: Good  Visit Information  Last OT Received On: 07/17/13 Assistance Needed: +2 History of Present Illness: 52 y.o. female s/p R TKA revision       Prior Functioning     Home Living Family/patient expects to be discharged to:: Private residence Living Arrangements: Spouse/significant other Available Help at Discharge: Family Type of Home: House Home Access: Stairs to enter Secretary/administrator of Steps: 2 Entrance Stairs-Rails: Right Home Layout: One level Home Equipment: Walker - 2 wheels;Crutches;Bedside commode;Tub bench;Adaptive equipment Adaptive Equipment: Reacher Additional Comments: Patient reports she needs a long sponge for LB bathing Prior Function Level of Independence: Independent Comments: admits to falls at home "my knee would give out" Communication Communication: No difficulties Dominant Hand: Right         Vision/Perception Vision - History Baseline Vision: Wears glasses all the time Patient Visual Report: No change from baseline   Cognition  Cognition Arousal/Alertness: Awake/alert Behavior During Therapy: WFL for tasks assessed/performed Overall Cognitive Status: Within Functional Limits for tasks assessed    Extremity/Trunk Assessment Upper Extremity Assessment Upper Extremity Assessment: Overall WFL for tasks assessed     End of Session OT - End of Session Activity Tolerance: Patient limited by fatigue Patient left: in chair;with call bell/phone within reach  GO     [name] A 07/17/2013, 10:12 AM

## 2013-07-17 NOTE — Progress Notes (Signed)
Patient ID: [name], female   DOB: 1961/10/05, 52 y.o.   MRN: [id number] Postoperative day 2 total knee arthroplasty revision. Patient most likely will need short-term skilled nursing for discharge. She does not have family to care for her at home during the day.

## 2013-07-17 NOTE — Plan of Care (Signed)
Problem: Phase III Progression Outcomes Goal: Ambulates Outcome: Progressing w/PT Goal: Anticoagulant follow-up in place Outcome: Not Applicable Date Met:  07/17/13 asa

## 2013-07-17 NOTE — Progress Notes (Signed)
Clinical Social Work Department BRIEF PSYCHOSOCIAL ASSESSMENT 07/17/2013  Patient:  [name], [name]     Account Number:  [id number]     Admit date:  07/15/2013  Clinical Social Worker:  Doroteo Glassman  Date/Time:  07/17/2013 02:55 PM  Referred by:  Physician  Date Referred:  07/17/2013 Referred for  Psychosocial assessment   Other Referral:   Interview type:  Patient Other interview type:    PSYCHOSOCIAL DATA Living Status:  HUSBAND Admitted from facility:   Level of care:   Primary support name:  [name] Primary support relationship to patient:  SPOUSE Degree of support available:   adequate    CURRENT CONCERNS Current Concerns  Post-Acute Placement   Other Concerns:    SOCIAL WORK ASSESSMENT / PLAN Met with Pt to discuss d/c plans.    Pt stated that her husband works during the day, thus she'd be alone if she were to d/c home.  Pt understands the rationale for SNF and is agreeable to a SNF search.    CSW thanked Pt for her time.   Assessment/plan status:  Psychosocial Support/Ongoing Assessment of Needs Other assessment/ plan:   Information/referral to community resources:   SNF list    PATIENT'S/FAMILY'S RESPONSE TO PLAN OF CARE: Pt was drowsy but able to participate in the conversation.    Pt was ambivalent about SNF but knows that she can't be by herself.    Pt thanked CSW for time and assistance.   Providence Crosby, LCSWA Clinical Social Work [phone]

## 2013-07-17 NOTE — Care Management Note (Addendum)
    Page 1 of 2   07/19/2013     12:07:20 PM   CARE MANAGEMENT NOTE 07/19/2013  Patient:  [name], [name]   Account Number:  [id number]  Date Initiated:  07/16/2013  Documentation initiated by:  [facility]  Subjective/Objective Assessment:   52 year old female admitted s/p RKA.     Action/Plan:   From home with family.   Anticipated DC Date:  07/19/2013   Anticipated DC Plan:  HOME W HOME HEALTH SERVICES      DC Planning Services  CM consult      Choice offered to / List presented to:  C-1 Patient        HH arranged  HH-2 PT      [facility] agency  [facility]   Status of service:  Completed, signed off Medicare Important Message given?  NA - LOS <3 / Initial given by admissions (If response is "NO", the following Medicare IM given date fields will be blank) Date Medicare IM given:   Date Additional Medicare IM given:    Discharge Disposition:  HOME W HOME HEALTH SERVICES  Per UR Regulation:  Reviewed for med. necessity/level of care/duration of stay  If discussed at Long Length of Stay Meetings, dates discussed:    Comments:  07-19-13 Lorenda Ishihara RN CM 1200 Patient for d/c home today, [facility] notified of pending d/c. Orders for Home O2 written but need to verify qualifying sats prior to set up. Discussed with [facility]. Patient to d/c home with family.  07/17/13 KATHY MAHABIR RN,BSN NCM [phone] RECEIVED REFERRAL FOR D/C PLANS.SPOKE TO PATIENT.LIVES @HOME  W/SPOUSE/SON WHO WORKS DURING DAY.HAS 3N1,RW.PT-HH/SNF,NOTED 2+TOTAL ASST.HAS USED [facility] IN PAST AGREE TO USE THEM AGAIN IF NEEDED.PROVIDED W/PRIVATE DUTY CARE LIST AS RESOURCE.INFORMED PATIENT THAT SINCE INSURANCE IS MEDICAID & DIFFICULTY W/SNF PLACEMENT, HOME W/HH MAY BE THE MORE REASONABLE CHOICE,BUT ULTIMATELY THE DOCTOR & PATIENT WILL DETERMINE BEST D/C PLAN,CM PROVIDES RECOMMENDATIONS.PATIENT VOICED UNDERSTANDING.TC [facility] SPOKE TO JAMIE #[phone], AWARE OF REFERRAL.

## 2013-07-17 NOTE — Progress Notes (Signed)
Physical Therapy Treatment Patient Details Name: [name] MRN: [id number] DOB: 1961/12/06 Today's Date: 07/17/2013 Time: 9562-1308 PT Time Calculation (min): 24 min  PT Assessment / Plan / Recommendation  PT Comments   **Pt ambulated 45' with 3L O2. SaO2 94%, HR 103. At present pt requires supplemental O2 to maintain sats greater than 90%. *  Follow Up Recommendations  Home health PT;SNF; Pt not sure if she will have assist at home     Does the patient have the potential to tolerate intense rehabilitation     Barriers to Discharge        Equipment Recommendations  None recommended by PT    Recommendations for Other Services OT consult  Frequency 7X/week   Progress towards PT Goals Progress towards PT goals: Progressing toward goals  Plan Current plan remains appropriate    Precautions / Restrictions Precautions Precautions: Knee;Fall Restrictions Weight Bearing Restrictions: No Other Position/Activity Restrictions: WBAT   Pertinent Vitals/Pain **4/10 R knee with walking Ice applied*    Mobility  Bed Mobility Bed Mobility: Sit to Supine Supine to Sit: 4: Min assist;HOB elevated;With rails Supine to Sit: Patient Percentage: 80% Sit to Supine: 4: Min assist Details for Bed Mobility Assistance: Min A for RLE.  Transfers Transfers: Sit to Stand;Stand to Sit Sit to Stand: 4: Min assist;From chair/3-in-1 Sit to Stand: Patient Percentage: 90% Stand to Sit: 4: Min assist;To bed;To chair/3-in-1 Stand to Sit: Patient Percentage: 90% Details for Transfer Assistance: cues for LE management and use of UEs to self assist Ambulation/Gait Ambulation/Gait Assistance: 1: +2 Total assist Ambulation/Gait: Patient Percentage: 90% Ambulation Distance (Feet): 45 Feet Assistive device: Rolling walker Ambulation/Gait Assistance Details: 3L O2 while walking, SaO2 94%, HR 103, distance limited by fatigue Gait Pattern: Step-to pattern;Decreased step length - right;Decreased step  length - left;Antalgic;Trunk flexed General Gait Details: multiple standing rests required for task completion Stairs: No    Exercises Total Joint Exercises Ankle Circles/Pumps: AROM;10 reps;Supine;Both Quad Sets: AROM;10 reps;Supine;Both Towel Squeeze: AROM;Both;10 reps;Supine Short Arc Quad: AAROM;Right;10 reps;Supine Heel Slides: AAROM;10 reps;Supine;Right Hip ABduction/ADduction: AAROM;Right;10 reps;Supine Straight Leg Raises: AAROM;Right;10 reps Goniometric ROM: R knee flexion AAROM 50*, ext -5* supine   PT Diagnosis:    PT Problem List:   PT Treatment Interventions:     PT Goals (current goals can now be found in the care plan section) Acute Rehab PT Goals Patient Stated Goal: Resume previous lifestyle with increased knee stability PT Goal Formulation: With patient Time For Goal Achievement: 07/23/13 Potential to Achieve Goals: Good  Visit Information  Last PT Received On: 07/17/13 Assistance Needed: +2 History of Present Illness: 52 y.o. female s/p R TKA revision    Subjective Data  Patient Stated Goal: Resume previous lifestyle with increased knee stability   Cognition  Cognition Arousal/Alertness: Awake/alert Behavior During Therapy: WFL for tasks assessed/performed Overall Cognitive Status: Within Functional Limits for tasks assessed    Balance     End of Session PT - End of Session Equipment Utilized During Treatment: Gait belt;Right knee immobilizer;Oxygen Activity Tolerance: Patient limited by fatigue Patient left: with call bell/phone within reach;in bed Nurse Communication: Mobility status   GP     [name] 07/17/2013, 12:09 PM [phone]

## 2013-07-17 NOTE — Progress Notes (Addendum)
Clinical Social Work Department CLINICAL SOCIAL WORK PLACEMENT NOTE 07/17/2013  Patient:  [name], [name]  Account Number:  [id number] Admit date:  07/15/2013  Clinical Social Worker:  Doroteo Glassman  Date/time:  07/17/2013 02:58 PM  Clinical Social Work is seeking post-discharge placement for this patient at the following level of care:   SKILLED NURSING   (*CSW will update this form in Epic as items are completed)   07/17/2013  Patient/family provided with [facility] Department of Clinical Social Work's list of facilities offering this level of care within the geographic area requested by the patient (or if unable, by the patient's family).  07/17/2013  Patient/family informed of their freedom to choose among providers that offer the needed level of care, that participate in Medicare, Medicaid or managed care program needed by the patient, have an available bed and are willing to accept the patient.  07/17/2013  Patient/family informed of [facility]' ownership interest in [facility], as well as of the fact that they are under no obligation to receive care at this facility.  PASARR submitted to EDS on 07/17/2013 PASARR number received from E[employer] on 07/17/2013  FL2 transmitted to all facilities in geographic area requested by pt/family on  07/17/2013 FL2 transmitted to all facilities within larger geographic area on   Patient informed that his/her managed care company has contracts with or will negotiate with  certain facilities, including the following:     Patient/family informed of bed offers received:   Patient chooses bed at  Physician recommends and patient chooses bed at    Patient to be transferred to  on   Patient to be transferred to facility by   The following physician request were entered in Epic:   Additional Comments:  Providence Crosby, Theresia Majors Clinical Social Work [phone]   07/18/13-No [facility] offers. Patient decided to return  home.

## 2013-07-17 NOTE — Progress Notes (Signed)
Transferred to 1530 per hospital protocol (lowered census). Marcelles Clinard, [employer]

## 2013-07-17 NOTE — Progress Notes (Signed)
Physical Therapy Treatment Patient Details Name: [name] MRN: [id number] DOB: 26-Oct-1961 Today's Date: 07/17/2013 Time: 0821-0850 PT Time Calculation (min): 29 min  PT Assessment / Plan / Recommendation  PT Comments   **POD #2 for R TKA revision. Pt ambulated 52' with RW and 4L O2. SaO2 88%, HR 106 while walking. Pt fatigues quickly. *  Follow Up Recommendations  Home health PT;SNF     Does the patient have the potential to tolerate intense rehabilitation     Barriers to Discharge        Equipment Recommendations  None recommended by PT    Recommendations for Other Services OT consult  Frequency 7X/week   Progress towards PT Goals Progress towards PT goals: Progressing toward goals  Plan Current plan remains appropriate    Precautions / Restrictions Precautions Precautions: Knee;Fall Restrictions Weight Bearing Restrictions: No Other Position/Activity Restrictions: WBAT   Pertinent Vitals/Pain **4/10 R knee with walking Ice applied*    Mobility  Bed Mobility Bed Mobility: Supine to Sit Supine to Sit: 4: Min assist;HOB elevated;With rails Supine to Sit: Patient Percentage: 80% Details for Bed Mobility Assistance: Min A for RLE. Pt utilizing rail to half roll and swing legs over edge to bring body upright Transfers Transfers: Sit to Stand;Stand to Sit Sit to Stand: 4: Min assist;From bed Sit to Stand: Patient Percentage: 80% Stand to Sit: 4: Min assist;To chair/3-in-1 Stand to Sit: Patient Percentage: 80% Details for Transfer Assistance: cues for LE management and use of UEs to self assist Ambulation/Gait Ambulation/Gait Assistance: 1: +2 Total assist Ambulation/Gait: Patient Percentage: 90% Ambulation Distance (Feet): 65 Feet Assistive device: Rolling walker Ambulation/Gait Assistance Details: Pt walked with 4L O2, SaO2 88%, HR 106, 2/4 dyspnea. Distance limited by fatigue. Gait Pattern: Step-to pattern;Decreased step length - right;Decreased step  length - left;Antalgic;Trunk flexed General Gait Details: multiple standing rests required for task completion Stairs: No    Exercises Total Joint Exercises Ankle Circles/Pumps: AROM;10 reps;Supine;Both Quad Sets: AROM;10 reps;Supine;Both Towel Squeeze: AROM;Both;10 reps;Supine Short Arc Quad: AAROM;Right;10 reps;Supine Heel Slides: AAROM;10 reps;Supine;Right Hip ABduction/ADduction: AAROM;Right;10 reps;Supine   PT Diagnosis:    PT Problem List:   PT Treatment Interventions:     PT Goals (current goals can now be found in the care plan section) Acute Rehab PT Goals Patient Stated Goal: Resume previous lifestyle with increased knee stability PT Goal Formulation: With patient Time For Goal Achievement: 07/23/13 Potential to Achieve Goals: Good  Visit Information  Last PT Received On: 07/17/13 Assistance Needed: +2 History of Present Illness: 52 y.o. female s/p R TKA revision    Subjective Data  Patient Stated Goal: Resume previous lifestyle with increased knee stability   Cognition  Cognition Arousal/Alertness: Awake/alert Behavior During Therapy: WFL for tasks assessed/performed Overall Cognitive Status: Within Functional Limits for tasks assessed    Balance     End of Session PT - End of Session Equipment Utilized During Treatment: Gait belt;Right knee immobilizer;Oxygen Activity Tolerance: Patient limited by fatigue Patient left: with call bell/phone within reach;in chair Nurse Communication: Mobility status   GP     Ralene Bathe Kistler 07/17/2013, 8:58 AM [phone]

## 2013-07-18 ENCOUNTER — Encounter (HOSPITAL_COMMUNITY): Payer: Self-pay | Admitting: Orthopaedic Surgery

## 2013-07-18 LAB — GLUCOSE, CAPILLARY
Glucose-Capillary: 126 mg/dL — ABNORMAL HIGH (ref 70–99)
Glucose-Capillary: 111 mg/dL — ABNORMAL HIGH (ref 70–99)

## 2013-07-18 MED ORDER — OXYCODONE-ACETAMINOPHEN 5-325 MG PO TABS: 1.0000 | ORAL_TABLET | ORAL | Status: DC | PRN

## 2013-07-18 MED ORDER — FERROUS SULFATE 325 (65 FE) MG PO TABS: 325.0000 mg | ORAL_TABLET | Freq: Three times a day (TID) | ORAL | Status: DC

## 2013-07-18 MED ORDER — ASPIRIN EC 325 MG PO TBEC: 325.0000 mg | DELAYED_RELEASE_TABLET | Freq: Two times a day (BID) | ORAL | Status: DC

## 2013-07-18 MED ORDER — OXYCODONE-ACETAMINOPHEN 5-325 MG PO TABS
1.0000 | ORAL_TABLET | ORAL | Status: DC | PRN
  Administered 2013-07-19: 1 via ORAL
  Filled 2013-07-18: qty 1

## 2013-07-18 NOTE — Progress Notes (Signed)
Subjective: 3 Days Post-Op Procedure(s) (LRB): REVISION ARTHROPLASTY RIGHT KNEE (Right) Patient reports pain as moderate.  Denies chest pain. No change in SOB from baseline. Some nausea earlier today and having "jerks". Questions if pain meds causing these side effects.  Objective: Vital signs in last 24 hours: Temp:  [98.1 F (36.7 C)-99.1 F (37.3 C)] 98.3 F (36.8 C) (07/21 1400) Pulse Rate:  [77-89] 89 (07/21 1732) Resp:  [20] 20 (07/21 1400) BP: (95-138)/(51-95) 138/95 mmHg (07/21 1732) SpO2:  [91 %-92 %] 92 % (07/21 1400)  Intake/Output from previous day: 07/20 0701 - 07/21 0700 In: 650 [P.O.:650] Out: 300 [Urine:300] Intake/Output this shift: Total I/O In: 720 [P.O.:720] Out: 350 [Urine:350]   Recent Labs  07/16/13 0719  HGB 11.2*    Recent Labs  07/16/13 0719  WBC 12.7*  RBC 4.13  HCT 34.6*  PLT 273    Recent Labs  07/16/13 0719  NA 133*  K 4.4  CL 95*  CO2 30  BUN 18  CREATININE 0.82  GLUCOSE 151*  CALCIUM 8.7   No results found for this basename: LABPT, INR,  in the last 72 hours  Neurologically intact Sensation intact distally Intact pulses distally Incision: scant drainage Compartment soft Incision benign otherwise Assessment/Plan: 3 Days Post-Op Procedure(s) (LRB): REVISION ARTHROPLASTY RIGHT KNEE (Right) Plan to discharge tomorrow to home  Will change pain meds tonight  Richardean Canal 07/18/2013, 6:54 PM

## 2013-07-18 NOTE — Progress Notes (Signed)
Physical Therapy Treatment Patient Details Name: [name] MRN: [id number] DOB: 21-Nov-1961 Today's Date: 07/18/2013 Time: 7829-5621 PT Time Calculation (min): 28 min  PT Assessment / Plan / Recommendation  History of Present Illness 52 y.o. female s/p R TKA revision   Clinical Impression    PT Comments   POD # 3 R TKRevisison pm session.  Pt now plans to D/C to home due to no Ins coverage for Therapy at SNF.  Amb pt in hallway, practiced steps then assisted back to bed to perform TE's.   Follow Up Recommendations  Home health PT     Does the patient have the potential to tolerate intense rehabilitation     Barriers to Discharge        Equipment Recommendations  None recommended by PT (has all from prior)    Recommendations for Other Services    Frequency 7X/week   Progress towards PT Goals Progress towards PT goals: Progressing toward goals  Plan      Precautions / Restrictions Precautions Precautions: Knee Restrictions Weight Bearing Restrictions: No RLE Weight Bearing: Weight bearing as tolerated    Pertinent Vitals/Pain C/o "soreness"    Mobility  Bed Mobility Bed Mobility: Sit to Supine Supine to Sit: 4: Min guard Sit to Supine: 4: Min guard Details for Bed Mobility Assistance: Assisted back to bed Transfers Transfers: Sit to Stand;Stand to Sit Sit to Stand: 5: Supervision;From toilet;From chair/3-in-1 Stand to Sit: 5: Supervision;To chair/3-in-1;To bed Details for Transfer Assistance: increased time Ambulation/Gait Ambulation/Gait Assistance: 5: Supervision;4: Min guard Ambulation Distance (Feet): 35 Feet Assistive device: Rolling walker Ambulation/Gait Assistance Details: Amb from recliner to stairs.  Pt required increased time and rest breaks due to mild dyspnea. Gait Pattern: Step-to pattern;Decreased step length - right;Decreased step length - left;Antalgic;Trunk flexed Gait velocity: decreased Stairs: Yes Stairs Assistance: 4: Min  guard Stair Management Technique: Two rails;Forwards Number of Stairs: 2    Exercises   Total Knee Replacement TE's 10 reps B LE ankle pumps 10 reps knee presses 10 reps heel slides  10 reps SAQ's 10 reps SLR's 10 reps ABD Followed by ICE    PT Goals (current goals can now be found in the care plan section) Acute Rehab PT Goals Patient Stated Goal: Resume previous lifestyle with increased knee stability  Visit Information  Last PT Received On: 07/18/13 Assistance Needed: +1 History of Present Illness: 52 y.o. female s/p R TKA revision    Subjective Data  Patient Stated Goal: Resume previous lifestyle with increased knee stability   Cognition  Cognition Arousal/Alertness: Awake/alert (a little groggy initially) Behavior During Therapy: WFL for tasks assessed/performed Overall Cognitive Status: Within Functional Limits for tasks assessed    Balance     End of Session PT - End of Session Equipment Utilized During Treatment: Gait belt Activity Tolerance: Patient limited by fatigue Patient left: in bed;with call bell/phone within reach   Felecia Shelling  PTA [facility]  Acute  Rehab Pager      [phone]

## 2013-07-18 NOTE — Progress Notes (Signed)
Physical Therapy Treatment Patient Details Name: [name] MRN: [id number] DOB: [birth year] Today's Date: 07/18/2013 Time: 9147-8295 PT Time Calculation (min): 42 min  PT Assessment / Plan / Recommendation  History of Present Illness 52 y.o. female s/p R TKA revision   Clinical Impression    PT Comments   Pt feeling better.  Assisted OOB with MinGuard assist as pt was able to move own R LE off bed.  Assisted to [facility].  Amb in hallway twice.  Pt required increased time as she fatigues quickly and requires freq rest breaks.    Follow Up Recommendations  Home health PT (Pt is leaning more to gonig home since spouse is not working)     Does the patient have the potential to tolerate intense rehabilitation     Barriers to Discharge        Equipment Recommendations       Recommendations for Other Services    Frequency 7X/week   Progress towards PT Goals Progress towards PT goals: Progressing toward goals  Plan      Precautions / Restrictions Precautions Precautions: Knee;Fall Restrictions Weight Bearing Restrictions: No RLE Weight Bearing: Weight bearing as tolerated    Pertinent Vitals/Pain No c/o pain    Mobility  Bed Mobility Bed Mobility: Sit to Supine Supine to Sit: 4: Min guard Details for Bed Mobility Assistance: increased time  Transfers Transfers: Sit to Stand;Stand to Sit Sit to Stand: 4: Min guard;From bed;From toilet Stand to Sit: 4: Min guard;To toilet;To chair/3-in-1 Details for Transfer Assistance: increased time Ambulation/Gait Ambulation/Gait Assistance: 4: Min assist Ambulation Distance (Feet): 40 Feet (20' x 2 one sitting rest break) Assistive device: Rolling walker Ambulation/Gait Assistance Details: RA sats avg 90%.  Pt did fatigue easily and required rest breaks to "catch her breath".  Amb with out KI and pt did well. Gait Pattern: Step-to pattern;Decreased step length - right;Decreased step length - left;Antalgic;Trunk flexed Gait  velocity: decreased    PT Goals (current goals can now be found in the care plan section) Acute Rehab PT Goals Patient Stated Goal: Resume previous lifestyle with increased knee stability  Visit Information  Last PT Received On: 07/18/13 Assistance Needed: +2 History of Present Illness: 52 y.o. female s/p R TKA revision    Subjective Data  Patient Stated Goal: Resume previous lifestyle with increased knee stability   Cognition  Cognition Arousal/Alertness: Awake/alert (a little groggy initially) Behavior During Therapy: WFL for tasks assessed/performed Overall Cognitive Status: Within Functional Limits for tasks assessed    Balance     End of Session PT - End of Session Equipment Utilized During Treatment: Gait belt Activity Tolerance: Patient limited by fatigue Patient left: with call bell/phone within reach;in chair   Felecia Shelling  PTA [facility]  Acute  Rehab Pager      [phone]

## 2013-07-18 NOTE — Progress Notes (Signed)
Spoke to [employer], [place] informed him per night shift RN patient O2 sats dropped at times during sleeping, but reported to me was that patient has been evaluated outpatient for sleep apnea but does not have results at this time. Patient 91% on Ra while ambulating but per NT dropped to 77% on Ra while asleep up to 92% on 3L O2. Also per Child psychotherapist note patient has no SNF orders due to medcaid only and does not want out of county SNF is for home with home health. Sullivan Lone states ok to leave out patient IV, he will be around to see later, and patient will most likely stay until tomorrow.

## 2013-07-18 NOTE — Progress Notes (Signed)
Clinical Social Work  CSW reviewed bed offers and no facilities in [place] could make an offer on Medicaid only at this time. CSW met with patient at bedside in order to discuss searching in other counties for SNF. Patient reports that husband and son will be around to assist occasionally and she will not go out of the county for SNF. Patient denies any safety concerns with returning home. CSW made CM aware of possible HH needs. CSW is signing off but available if further needs arise.  Unk Lightning, LCSW  (Coverage for [employer])

## 2013-07-18 NOTE — Progress Notes (Addendum)
Occupational Therapy Treatment Patient Details Name: [name] MRN: [id number] DOB: [birth year] Today's Date: 07/18/2013 Time: 4098-1191 OT Time Calculation (min): 12 min  OT Assessment / Plan / Recommendation  OT comments  Educated and practiced with AE for LB ADL. Pt is familiar with AE from previous surgeries and did well with it today.   Follow Up Recommendations  Would benefit from SNF (Per SW note, SNF cant take pt. Recommend HHOT and aide)    Barriers to Discharge       Equipment Recommendations  None recommended by OT    Recommendations for Other Services    Frequency Min 2X/week   Progress towards OT Goals Progress towards OT goals: Progressing toward goals  Plan Discharge plan remains appropriate    Precautions / Restrictions Precautions Precautions: Knee;Fall Restrictions RLE Weight Bearing: Weight bearing as tolerated   Pertinent Vitals/Pain 2/10 R knee; declines ice currently    ADL  Lower Body Dressing: Performed;Set up;Supervision/safety (to don sock and doff sock with reacher and sock aid) Where Assessed - Lower Body Dressing: Unsupported sitting ADL Comments: Pt familiar with AE from prevous surgeries. Pt had Medicaid and will issue AE kit. PRacticed with reacher to doff L sock with setup/supervision. She donned sock with sock aid wtih supervision and min verbal cues. Demonstrated LHS and shoe horn for pt. Also discussed reacher for donning pants/underwear and demonstrated for pt. Pt declined need to toilet and states she was up to [facility] earlier.     OT Diagnosis:    OT Problem List:   OT Treatment Interventions:     OT Goals(current goals can now be found in the care plan section) Acute Rehab OT Goals Patient Stated Goal: Resume previous lifestyle with increased knee stability Potential to Achieve Goals: Good  Visit Information  Last OT Received On: 07/18/13 History of Present Illness: 52 y.o. female s/p R TKA revision    Subjective Data      Prior Functioning       Cognition  Cognition Arousal/Alertness: Awake/alert (a little groggy initially) Behavior During Therapy: WFL for tasks assessed/performed Overall Cognitive Status: Within Functional Limits for tasks assessed    Mobility       Exercises      Balance     End of Session OT - End of Session Equipment Utilized During Treatment: Other (comment) (AE) Activity Tolerance: Patient tolerated treatment well Patient left: in chair;with call bell/phone within reach  GO     Lennox Laity [phone] 07/18/2013, 12:09 PM

## 2013-07-18 NOTE — Progress Notes (Signed)
Paged Dr. Magnus Ivan about O2 sats and patient ? Discharge, awaiting call back.

## 2013-07-19 ENCOUNTER — Telehealth: Payer: Self-pay | Admitting: Adult Health

## 2013-07-19 LAB — GLUCOSE, CAPILLARY

## 2013-07-19 NOTE — Telephone Encounter (Signed)
[facility] - [phone].  States that pt was released from hospital today.  Rec'd O2 order.  Pt does not have qualifying sats.  Needs this ASAP, please.  Antionette Fairy

## 2013-07-19 NOTE — Discharge Summary (Signed)
Patient ID: [name] MRN: [id number] DOB/AGE: [birth year] 52 y.o.  Admit date: 07/15/2013 Discharge date: 07/19/2013  Admission Diagnoses:  Principal Problem:   Failed total knee, right   Discharge Diagnoses:  S/p revision right total knee   Past Medical History  Diagnosis Date  . Asthma   . Hypertension   . Hyperlipidemia   . GERD (gastroesophageal reflux disease)   . Depression   . Aortic stenosis, moderate     per 11/2011 echo  . Blood transfusion     2013[facility]  . Headache(784.0)     h/o migraines  . Pulmonary embolism 02/08/12    S/P RT TOTAL KNEE ON 02/03/12--ON 02/08/12--DEVELOPED ACUTE SOB AND CHEST PAIN--AND DIAGNOSED WITH  PULMONARY EMBOLUS AND PNEUMONIA  . Restless leg syndrome   . Myocardial infarction     PT THINKS SHE WAS DX WITH MI AT THE TIME OF HEART STENTING  . CAD (coronary artery disease)     Cath 2010 with DES x 1 RCA-- PT'S CARDIOLOGIST IS DR. MCALHANY  . Uterine fibroid     NO PROBLEMS AT PRESENT FROM THE FIBROIDS-STATES SHE IS POST MENOPAUSAL-LAST MENSES 2010 EXCEPT FOR EPISODE THIS YR OF BLEEDING RELATED TO FIBROIDS.  Marland Kitchen Heart murmur   . Pneumonia 02/08/12    HOSPITALIZED AT [facility] WITH PNEUMONIA AND WITH PULMONARY EMBOLUS  . Diabetes mellitus DIAGNOSED IN2010    ORAL MEDS  . Arthritis     PAIN AND SEVERE OA LEFT KNEE ; S/P RIGHT TKA ON 02/03/12; HAS LOWER BACK PAIN-UNABLE TO STAND MORE THAN 10 MIN; ARTHRITIS "ALL OVER"  . Neck pain   . Weakness     BOTH HANDS - S/P BILATERAL CARPAL TUNNEL RELEASE--BUT STILL HAS WEAKNESS--OFTEN DROPS THINGS  . Shortness of breath     with exertion  . Eczema     on back  . Kidney stones   . Anemia     Surgeries: Procedure(s): REVISION ARTHROPLASTY RIGHT KNEE on 07/15/2013   Consultants:  PT/OT  Discharged Condition: Improved  Hospital Course: [name] is an 52 y.o. female who was admitted 07/15/2013 for operative treatment ofFailed total knee, right. Patient has severe unremitting pain that  affects sleep, daily activities, and work/hobbies. After pre-op clearance the patient was taken to the operating room on 07/15/2013 and underwent  Procedure(s): REVISION ARTHROPLASTY RIGHT KNEE.   Patient slow progress with PT/OT required additional stay. Patient not edgibile for SNF placement.  Patient was given perioperative antibiotics: Anti-infectives   Start     Dose/Rate Route Frequency Ordered Stop   07/15/13 1800  ceFAZolin (ANCEF) IVPB 2 g/50 mL premix     2 g 100 mL/hr over 30 Minutes Intravenous Every 6 hours 07/15/13 1618 07/16/13 0012   07/15/13 0859  ceFAZolin (ANCEF) 3 g in dextrose 5 % 50 mL IVPB     3 g 160 mL/hr over 30 Minutes Intravenous On call to O.R. 07/15/13 0859 07/15/13 1205       Patient was given sequential compression devices, early ambulation, and chemoprophylaxis to prevent DVT.  Patient benefited maximally from hospital stay and there were no complications.    Recent vital signs: Patient Vitals for the past 24 hrs:  BP Temp Temp src Pulse Resp SpO2  07/19/13 0900 - - - - - 89 %  07/19/13 0846 - - - - - 91 %  07/19/13 0609 123/62 mmHg 98.4 F (36.9 C) Oral 105 20 92 %  07/18/13 2101 148/55 mmHg 98.3 F (36.8 C)  Oral 102 18 96 %  07/18/13 1732 138/95 mmHg - - 89 - -  07/18/13 1400 95/51 mmHg 98.3 F (36.8 C) Oral 77 20 92 %     Recent laboratory studies: No results found for this basename: WBC, HGB, HCT, PLT, NA, K, CL, CO2, BUN, CREATININE, GLUCOSE, PT, INR, CALCIUM, 2,  in the last 72 hours   Discharge Medications:     Medication List    STOP taking these medications       traMADol 50 MG tablet  Commonly known as:  ULTRAM      TAKE these medications       albuterol 108 (90 BASE) MCG/ACT inhaler  Commonly known as:  PROVENTIL HFA;VENTOLIN HFA  Inhale 2 puffs into the lungs every 4 (four) hours as needed for wheezing or shortness of breath (((PLAN B))).     albuterol (2.5 MG/3ML) 0.083% nebulizer solution  Commonly known as:  PROVENTIL   Take 2.5 mg by nebulization every 4 (four) hours as needed for wheezing or shortness of breath (((PLAN C))).     aspirin EC 325 MG tablet  Take 1 tablet (325 mg total) by mouth 2 (two) times daily.     atorvastatin 80 MG tablet  Commonly known as:  LIPITOR  Take 80 mg by mouth at bedtime.     budesonide-formoterol 160-4.5 MCG/ACT inhaler  Commonly known as:  SYMBICORT  Inhale 2 puffs into the lungs 2 (two) times daily.     clopidogrel 75 MG tablet  Commonly known as:  PLAVIX  Take 75 mg by mouth every morning.     Compressor/Nebulizer Misc  As directed     cyclobenzaprine 10 MG tablet  Commonly known as:  FLEXERIL  Take 10 mg by mouth every 8 (eight) hours as needed for muscle spasms.     escitalopram 20 MG tablet  Commonly known as:  LEXAPRO  Take 20 mg by mouth every morning.     ezetimibe 10 MG tablet  Commonly known as:  ZETIA  Take 1 tablet (10 mg total) by mouth every evening.     famotidine 20 MG tablet  Commonly known as:  PEPCID  Take 20 mg by mouth at bedtime.     ferrous sulfate 325 (65 FE) MG tablet  Take 1 tablet (325 mg total) by mouth 3 (three) times daily after meals.     furosemide 40 MG tablet  Commonly known as:  LASIX  Take 40 mg by mouth 2 (two) times daily.     gabapentin 300 MG capsule  Commonly known as:  NEURONTIN  Take 300 mg by mouth 2 (two) times daily.     glipiZIDE 5 MG tablet  Commonly known as:  GLUCOTROL  1/2 tab with morning meal, 1 whole tab with evening meal     hydrochlorothiazide 25 MG tablet  Commonly known as:  HYDRODIURIL  Take 25 mg by mouth every morning.     losartan 100 MG tablet  Commonly known as:  COZAAR  Take 100 mg by mouth daily with breakfast.     multivitamin tablet  Take 1 tablet by mouth daily.     omeprazole 20 MG capsule  Commonly known as:  PRILOSEC  Take 20 mg by mouth daily before breakfast.     oxyCODONE-acetaminophen 5-325 MG per tablet  Commonly known as:  ROXICET  Take 1-2 tablets by  mouth every 4 (four) hours as needed for pain.     potassium chloride SA 20 MEQ  tablet  Commonly known as:  K-DUR,KLOR-CON  Take 40 mEq by mouth daily after breakfast.        Diagnostic Studies: Dg Chest 2 View  07/11/2013   *RADIOLOGY REPORT*  Clinical Data: Preoperative evaluation  CHEST - 2 VIEW  Comparison: Chest radiograph 01/29/2012; chest CT 02/21/2013.  Findings: Stable cardiac and mediastinal contours.  Band-like opacity within the left lower lobe may represent atelectasis and/or scarring.  No large consolidative pulmonary opacity.  No pleural effusion or pneumothorax.  Regional skeleton is unremarkable.  IMPRESSION: 1.  Band-like opacity within the left lower lobe may represent atelectasis and/or scarring. 2.  No evidence for acute cardiopulmonary process.   Original Report Authenticated By: Annia Belt, M.D   Dg Knee Right Port  07/15/2013   *RADIOLOGY REPORT*  Clinical Data: Right total knee revision  PORTABLE RIGHT KNEE - 1-2 VIEW  Comparison: 02/03/2012  Findings: Right total knee arthroplasty with spacer.  Revision of the distal humeral component.  Associated soft tissue swelling, subcutaneous gas, and lateral surgical drain. Overlying skin staples.  Old post-traumatic deformity of the medial tibial plateau.  Large suprapatellar effusion.  IMPRESSION: Status post revision of right total knee arthroplasty, as described above.   Original Report Authenticated By: Charline Bills, M.D.    Disposition: 01-Home or Self Care      Discharge Orders   Future Appointments Provider Department Dept Phone   09/07/2013 3:00 PM Nyoka Cowden, MD [facility] [phone]   10/19/2013 9:00 AM [facility] Main Office [place]) [phone]   10/20/2013 10:00 AM Mariane Masters, [facility] [phone]   Future Orders Complete By Expires     Discharge wound care:  As directed     Comments:      Keep dressing clean dry and intact until  Friday then remove dressing and shower. Apply clean dressing after showering    Elevate operative extremity  As directed     Comments:      Encourage patient to wiggle toes often    Weight bearing as tolerated  As directed     Comments:      Knee immobilizer       Follow-up Information   Follow up with [name], MD. Schedule an appointment as soon as possible for a visit in 2 weeks.   Contact information:   [address] [place] [address] [phone]        Signed: Richardean Canal 07/19/2013, 10:52 AM

## 2013-07-19 NOTE — Telephone Encounter (Signed)
I spoke with the pt and advised of results and plan for sleep study. I also spoke with Dawn, Diplomatic Services operational officer on pt unit and advised. Doctor wanted to make sure that orders were placed and that O2 would be set-up today. I advised I will place the order and let them know pt will be discharged today and that O2 needs to be set-up asap. Orders placed. Carron Curie, CMA

## 2013-07-19 NOTE — Progress Notes (Signed)
Subjective: 4 Days Post-Op Procedure(s) (LRB): REVISION ARTHROPLASTY RIGHT KNEE (Right) Patient reports pain as moderate.  Reports jerking sensation decreasing in severity. No other complaints.  Objective: Vital signs in last 24 hours: Temp:  [98.3 F (36.8 C)-98.4 F (36.9 C)] 98.4 F (36.9 C) (07/22 0609) Pulse Rate:  [77-105] 105 (07/22 0609) Resp:  [18-20] 20 (07/22 0609) BP: (95-148)/(51-95) 123/62 mmHg (07/22 0609) SpO2:  [89 %-96 %] 89 % (07/22 0900)  Intake/Output from previous day: 07/21 0701 - 07/22 0700 In: 960 [P.O.:960] Out: 4500 [Urine:4500] Intake/Output this shift: Total I/O In: 240 [P.O.:240] Out: 400 [Urine:400]  No results found for this basename: HGB,  in the last 72 hours No results found for this basename: WBC, RBC, HCT, PLT,  in the last 72 hours No results found for this basename: NA, K, CL, CO2, BUN, CREATININE, GLUCOSE, CALCIUM,  in the last 72 hours No results found for this basename: LABPT, INR,  in the last 72 hours  Neurologically intact Sensation intact distally Intact pulses distally Dorsiflexion/Plantar flexion intact Incision: scant drainage Compartment soft  Assessment/Plan: 4 Days Post-Op Procedure(s) (LRB): REVISION ARTHROPLASTY RIGHT KNEE (Right) Up with therapy If patient does well with PT may d/c home later today with home health Follow-up with Dr. Magnus Ivan at 2 weeks post-op  Richardean Canal 07/19/2013, 10:47 AM

## 2013-07-19 NOTE — Telephone Encounter (Addendum)
Pt returned call. She asked to be called back at [phone]. She says she will "hopefully" be d/c'd today from hospital but wants results asap. If calling after she has been d/c'd call her cell # [phone]. Hazel Sams

## 2013-07-19 NOTE — Progress Notes (Signed)
Physical Therapy Treatment Patient Details Name: [name] MRN: [id number] DOB: 1961/08/25 Today's Date: 07/19/2013 Time: 9562-1308 PT Time Calculation (min): 26 min  PT Assessment / Plan / Recommendation  History of Present Illness 52 y.o. female s/p R TKA revision   Clinical Impression    PT Comments   POD # 4 R TKR planning to D/C to home today.  Amb pt in hallway twice while monitoring RA.  Sats avg 92%. Pt demon limited activity tolerance and requires freq rest breaks.   Follow Up Recommendations  Home health PT     Does the patient have the potential to tolerate intense rehabilitation     Barriers to Discharge        Equipment Recommendations  None recommended by PT    Recommendations for Other Services    Frequency 7X/week   Progress towards PT Goals    Plan      Precautions / Restrictions Precautions Precautions: Knee Required Braces or Orthoses: Knee Immobilizer - Right Knee Immobilizer - Right: Discontinue once straight leg raise with < 10 degree lag Restrictions Weight Bearing Restrictions: No RLE Weight Bearing: Weight bearing as tolerated    Pertinent Vitals/Pain No c/o pain    Mobility  Bed Mobility Bed Mobility: Not assessed Supine to Sit: 5: Supervision;HOB elevated Details for Bed Mobility Assistance: Pt OOB in recliner Transfers Transfers: Sit to Stand;Stand to Sit Sit to Stand: 5: Supervision;From chair/3-in-1 Stand to Sit: 5: Supervision;To chair/3-in-1 Details for Transfer Assistance: increased time Ambulation/Gait Ambulation/Gait Assistance: 5: Supervision Ambulation Distance (Feet): 55 Feet (20', 15', 20' ) Assistive device: Rolling walker Ambulation/Gait Assistance Details: pt requires freq rest breaks and demon 3/4 DOE.  RA sats avg 92% and HR 134.  Gait Pattern: Step-to pattern;Decreased step length - right;Decreased step length - left;Antalgic;Trunk flexed Gait velocity: decreased     PT Goals (current goals can now be  found in the care plan section) Acute Rehab PT Goals Patient Stated Goal: Resume previous lifestyle with increased knee stability  Visit Information  Last PT Received On: 07/19/13 Assistance Needed: +1 History of Present Illness: 52 y.o. female s/p R TKA revision    Subjective Data  Patient Stated Goal: Resume previous lifestyle with increased knee stability   Cognition  Cognition Arousal/Alertness: Awake/alert Behavior During Therapy: WFL for tasks assessed/performed Overall Cognitive Status: Within Functional Limits for tasks assessed    Balance     End of Session PT - End of Session Equipment Utilized During Treatment: Gait belt Activity Tolerance: Patient limited by fatigue Nurse Communication: Mobility status   Felecia Shelling  PTA [facility]  Acute  Rehab Pager      [phone]

## 2013-07-19 NOTE — Telephone Encounter (Signed)
I spoke with [name] and she needs a copy of ONO. I have faxed a copy to [phone] and placed a copy in [facility] folder. Carron Curie, CMA

## 2013-07-19 NOTE — Telephone Encounter (Signed)
Nurse @ [facility] called & states pt is set for d/c today; Doc there would like this scheduled prior to pt being d/c.  I offered to schedule a HFU, but was refused.  Will cb.  Antionette Fairy

## 2013-07-19 NOTE — Progress Notes (Signed)
Occupational Therapy Treatment Patient Details Name: [name] MRN: [id number] DOB: Nov 21, 1961 Today's Date: 07/19/2013 Time: 4098-1191 OT Time Calculation (min): 28 min  OT Assessment / Plan / Recommendation  History of present illness 52 y.o. female s/p R TKA revision       OT comments  Pt is doing well. Educated on toilet aid option and further practiced toilet transfer.   Follow Up Recommendations  Home health OT;Supervision/Assistance - 24 hour;Other (comment) (HH aide)    Barriers to Discharge       Equipment Recommendations  None recommended by OT    Recommendations for Other Services    Frequency Min 2X/week   Progress towards OT Goals Progress towards OT goals: Progressing toward goals  Plan Discharge plan remains appropriate    Precautions / Restrictions Precautions Precautions: Knee Required Braces or Orthoses: Knee Immobilizer - Right Knee Immobilizer - Right: Discontinue once straight leg raise with < 10 degree lag Restrictions RLE Weight Bearing: Weight bearing as tolerated   Pertinent Vitals/Pain 89% on RA after toilet transfer. Had sat several minutes before O2 checked. Up to 91% with rest. Reappied O2.    ADL  Toilet Transfer: Performed;Minimal assistance Toilet Transfer Method: Surveyor, minerals: Bedside commode Toileting - Clothing Manipulation and Hygiene: Performed;+1 Total assistance (see ADL notes below) Where Assessed - Toileting Clothing Manipulation and Hygiene: Sit to stand from 3-in-1 or toilet Equipment Used: Rolling walker;Other (comment) (toilet aid) ADL Comments: Pt states she can not wipe herself after toileting. Total assist for hygiene. Pt states husband helped with hygiene at home and she "did the best she could." She states she has a toilet aid (tongs) but she doesnt like to use them. Demonstrated curved handle toilet aid and did not have any in supply to issue to pt. Explained coverage and where to obtain.  Pt needed min cues for hand placement wtih sit to stand from bed and to transfer onto toilet. Reviewed safety considerations with functional transfers at end of session again.     OT Diagnosis:    OT Problem List:   OT Treatment Interventions:     OT Goals(current goals can now be found in the care plan section) Acute Rehab OT Goals Patient Stated Goal: Resume previous lifestyle with increased knee stability Potential to Achieve Goals: Good  Visit Information  Last OT Received On: 07/19/13 Assistance Needed: +1 History of Present Illness: 52 y.o. female s/p R TKA revision    Subjective Data      Prior Functioning       Cognition  Cognition Arousal/Alertness: Awake/alert Behavior During Therapy: WFL for tasks assessed/performed Overall Cognitive Status: Within Functional Limits for tasks assessed    Mobility  Bed Mobility Supine to Sit: 5: Supervision;HOB elevated Transfers Transfers: Sit to Stand;Stand to Sit Sit to Stand: 4: Min guard;With upper extremity assist;From bed;From chair/3-in-1 Stand to Sit: 4: Min assist;With upper extremity assist;To chair/3-in-1 Details for Transfer Assistance: verbal cues for hand placement. min assist for sittting on BSC as pt appeared to be somewhat hurried to get on commode and hadnt fully reached for [facility].    Exercises      Balance     End of Session OT - End of Session Equipment Utilized During Treatment: Rolling walker Activity Tolerance: Patient tolerated treatment well Patient left: in chair;with call bell/phone within reach  GO     [name] [phone] 07/19/2013, 9:44 AM

## 2013-07-27 ENCOUNTER — Telehealth: Payer: Self-pay | Admitting: Internal Medicine

## 2013-07-27 NOTE — Telephone Encounter (Signed)
I spoke with [name]. She is wantign to know if pt is being scheduled for a sleep study. I advised her this was correct. She needed nothing further

## 2013-08-04 ENCOUNTER — Encounter (HOSPITAL_BASED_OUTPATIENT_CLINIC_OR_DEPARTMENT_OTHER): Payer: Medicaid Other

## 2013-08-05 ENCOUNTER — Ambulatory Visit (HOSPITAL_BASED_OUTPATIENT_CLINIC_OR_DEPARTMENT_OTHER): Payer: Medicaid Other | Attending: Adult Health

## 2013-08-05 VITALS — Ht 63.0 in | Wt 327.0 lb

## 2013-08-05 DIAGNOSIS — G4733 Obstructive sleep apnea (adult) (pediatric): Secondary | ICD-10-CM | POA: Insufficient documentation

## 2013-08-05 DIAGNOSIS — R0600 Dyspnea, unspecified: Secondary | ICD-10-CM

## 2013-08-09 ENCOUNTER — Telehealth: Payer: Self-pay | Admitting: *Deleted

## 2013-08-09 DIAGNOSIS — R9389 Abnormal findings on diagnostic imaging of other specified body structures: Secondary | ICD-10-CM

## 2013-08-09 NOTE — Telephone Encounter (Signed)
LMTCB for the pt 

## 2013-08-09 NOTE — Telephone Encounter (Signed)
Message copied by Christen Butter on Tue Aug 09, 2013  9:54 AM ------      Message from: Sandrea Hughs B      Created: Mon Aug 08, 2013  8:22 PM       Yes, due for ct no contrast needed       ----- Message -----         From: Christen Butter, CMA         Sent: 08/08/2013  12:17 PM           To: Nyoka Cowden, MD            Does she still need scan in August?      Please advise thanks      ----- Message -----         From: Nyoka Cowden, MD         Sent: 05/12/2013   1:24 PM           To: Christen Butter, CMA            Needs limited chest ct s contrast f/u nodule             ------

## 2013-08-12 ENCOUNTER — Encounter: Payer: Self-pay | Admitting: Adult Health

## 2013-08-14 DIAGNOSIS — G4733 Obstructive sleep apnea (adult) (pediatric): Secondary | ICD-10-CM

## 2013-08-14 DIAGNOSIS — R443 Hallucinations, unspecified: Secondary | ICD-10-CM

## 2013-08-14 NOTE — Procedures (Signed)
[name], [name]           ACCOUNT NO.:  [id number]  MEDICAL RECORD NO.:  [id number]          PATIENT TYPE:  OUT  LOCATION:  SLEEP CENTER                 FACILITY:  [facility]  PHYSICIAN:  Barbaraann Share, MD,FCCPDATE OF BIRTH:  1961/09/22  DATE OF STUDY:  08/05/2013                           NOCTURNAL POLYSOMNOGRAM  REFERRING PHYSICIAN:  Rubye Oaks, NP  INDICATION FOR STUDY:  Hypersomnia with sleep apnea.  EPWORTH SLEEPINESS SCORE:  10.  MEDICATIONS:  SLEEP ARCHITECTURE:  The patient had a total sleep time of 292 minutes with adequate slow-wave sleep for age and decreased quantity of REM. Sleep onset latency was normal at 13 minutes and REM onset was mildly prolonged at 145 minutes.  Sleep efficiency was mildly reduced at 84%.  RESPIRATORY DATA:  The patient was found to have 14 apneas and 9 obstructive hypopneas, giving her an apnea-hypopnea index of only 5 events per hour.  The events occurred in all body positions, but were definitely increased during REM.  There was loud snoring noted throughout.  The patient did not meet split night protocol secondary to her small numbers of events.  OXYGEN DATA:  There was O2 desaturation as low as 85% with her obstructive events and also independent of her events.  Oxygen was added at 1 o'clock in the morning secondary to persistently low oxygen saturations not associated with events.  This was continued at 1 L for the rest of the night.  CARDIAC DATA:  No clinically significant arrhythmias were noted.  MOVEMENTS/PARASOMNIA:  The patient was found to have 260 leg jerks, however, none of them were periodic in nature and resulted in very little sleep disruption.  There were no abnormal behavior seen.  IMPRESSION/RECOMMENDATIONS: 1. Minimal obstructive sleep apnea/hypopnea syndrome, with an AHI of 5     events per hour and oxygen desaturation as low as 85%.  It is     unclear how much this may be impacting her sleep and quality  of     life, but does not represent a significant increase in     cardiovascular risk.  Treatment for this can include a trial of     weight loss alone, upper airway surgery, dental appliance, also     CPAP.  Clinical correlation is suggested. 2. Mild oxygen desaturation as low as 85% associated with her events     and also independent of her events.  Oxygen at 1 L/minute was added     at 1:00 a.m. per Sleep Center protocol. 3. The patient was noted to have large numbers of leg jerks, primarily     at the beginning of the study, however,     they were not periodic in nature and resulted in very little sleep     disruption.  I suspect this is not related to a primary movement     disorder of sleep.     Barbaraann Share, MD,FCCP Diplomate, American Board of Sleep Medicine    [facility]/MED[facility]  D:  08/14/2013 14:04:32  T:  08/14/2013 16:26:17  Job:  [id number]

## 2013-08-16 ENCOUNTER — Encounter: Payer: Self-pay | Admitting: Adult Health

## 2013-08-17 ENCOUNTER — Telehealth: Payer: Self-pay | Admitting: Adult Health

## 2013-08-17 NOTE — Telephone Encounter (Signed)
8.8.14 sleep study Per TP: (+) mild/min OSA, no CPAP indicated.  Continue on home O2 and bedtime and follow up as planned with MW.  Jeanene Erb spoke with patient, advised on sleep study results/recs as stated by TP above.  Pt verbalized her understanding and denied any questions at this time.  She will keep her 9.15.14 appt with MW.  Will sign off.

## 2013-08-22 NOTE — Telephone Encounter (Signed)
Pt aware ct needed and order was sent to [facility]

## 2013-08-25 ENCOUNTER — Other Ambulatory Visit: Payer: Medicaid Other

## 2013-09-01 ENCOUNTER — Ambulatory Visit (INDEPENDENT_AMBULATORY_CARE_PROVIDER_SITE_OTHER)
Admission: RE | Admit: 2013-09-01 | Discharge: 2013-09-01 | Disposition: A | Discharge status: Home / Self Care | Payer: Medicaid Other | Source: Ambulatory Visit | Attending: Internal Medicine | Admitting: Internal Medicine

## 2013-09-01 DIAGNOSIS — R9389 Abnormal findings on diagnostic imaging of other specified body structures: Secondary | ICD-10-CM

## 2013-09-01 IMAGING — CT CT CHEST W/O CM
2 of 4 series · 15 of 36 positions shown, 18 images · IV contrast (Omnipaque 300)
Comparison: [DATE]

CLINICAL DATA: Follow up pulmonary nodule

CT CHEST WITHOUT CONTRAST
TECHNIQUE: Multidetector CT imaging of the chest was performed
following the standard protocol without IV contrast.

[Series 2: chest routine with · axial · 0.73mm/px · z∈[-314,-68]mm · 12 of 59 slices shown, 15 images]
[im 5/59  mediastinal]
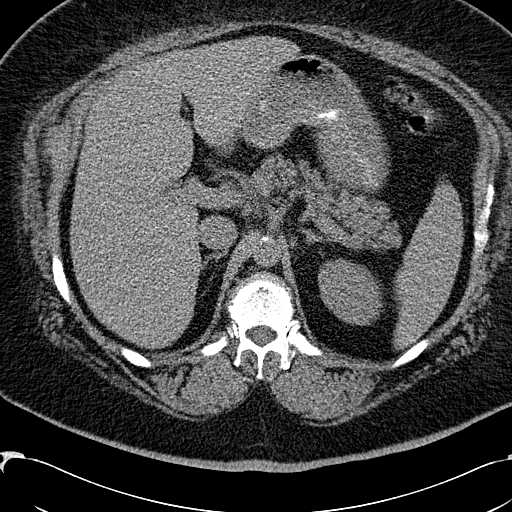
[im 5/59  lung]
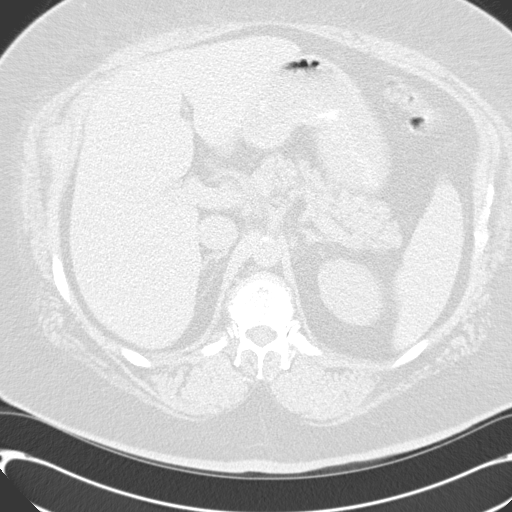
[im 9/59  lung]
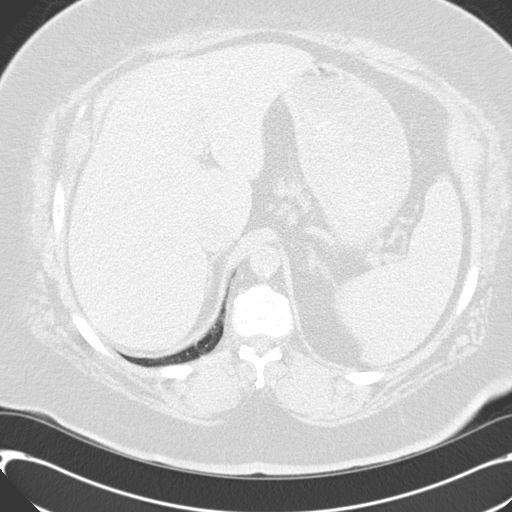
[im 14/59  lung]
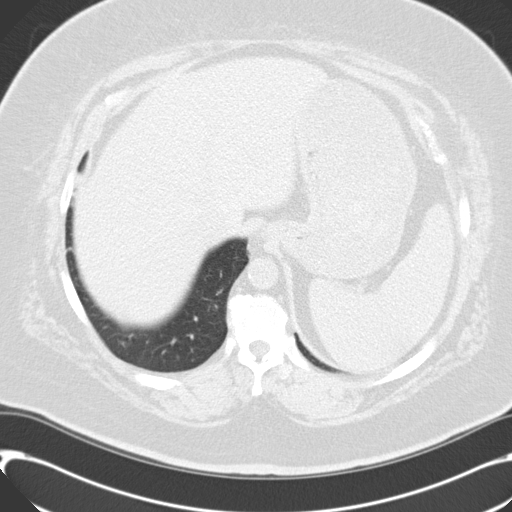
[im 18/59  lung]
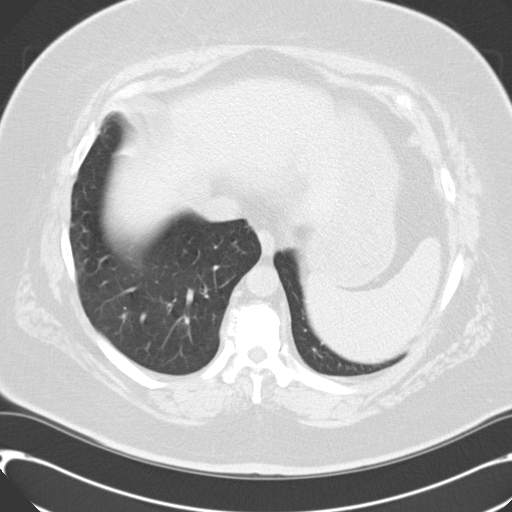
[im 23/59  mediastinal]
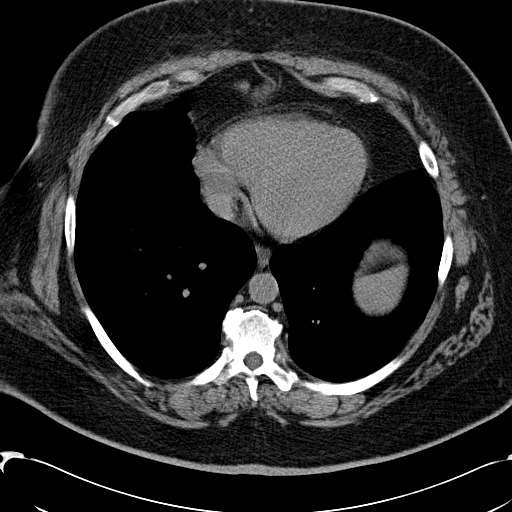
[im 23/59  lung]
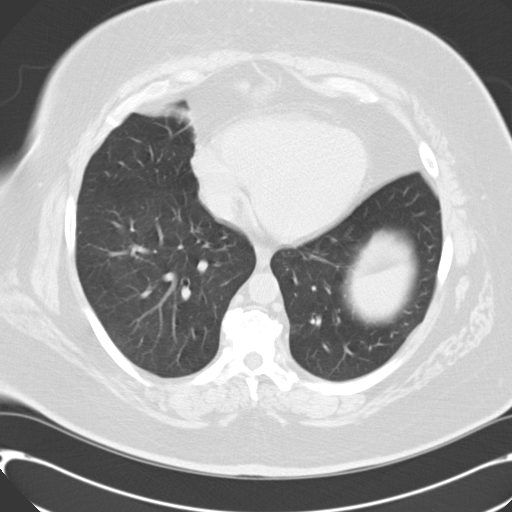
[im 27/59  lung]
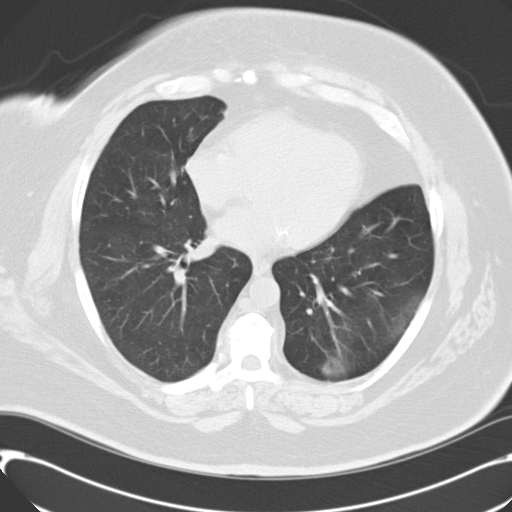
[im 32/59  lung]
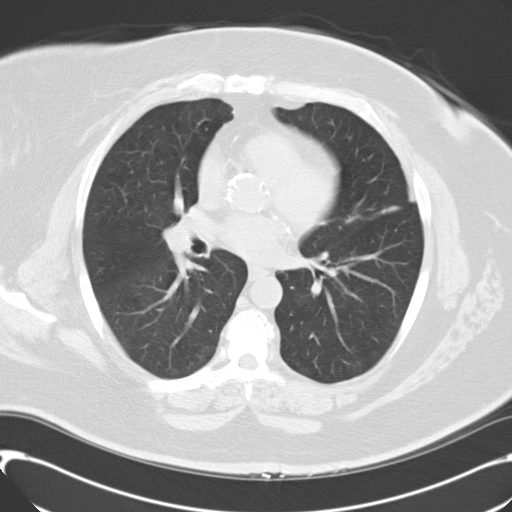
[im 36/59  lung]
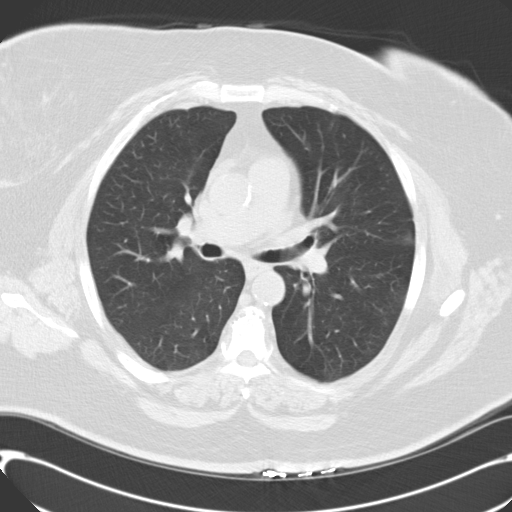
[im 41/59  mediastinal]
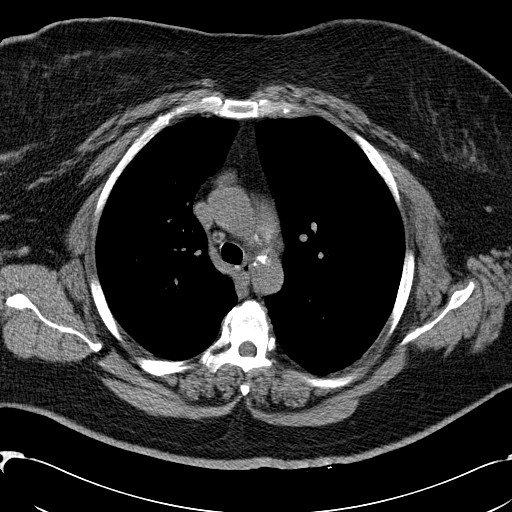
[im 41/59  lung]
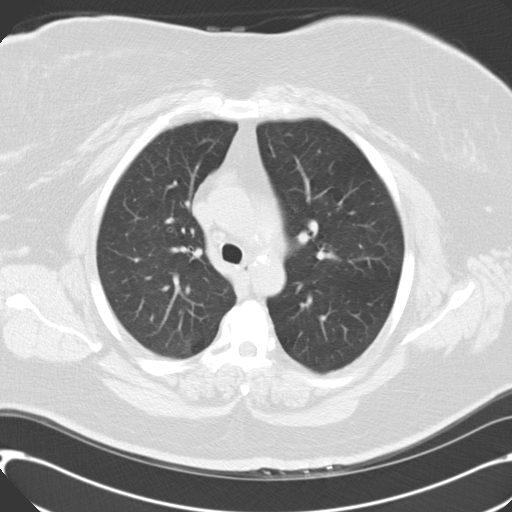
[im 45/59  lung]
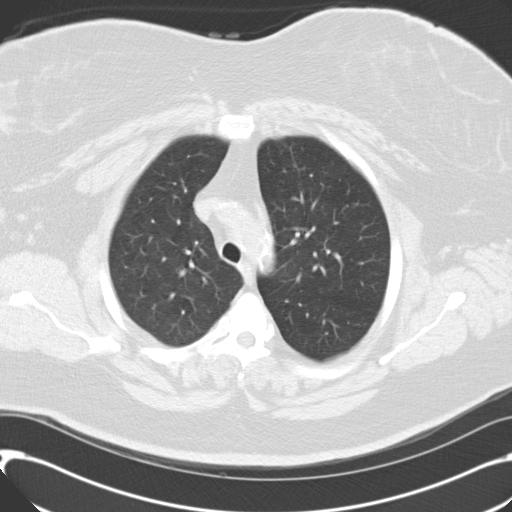
[im 50/59  lung]
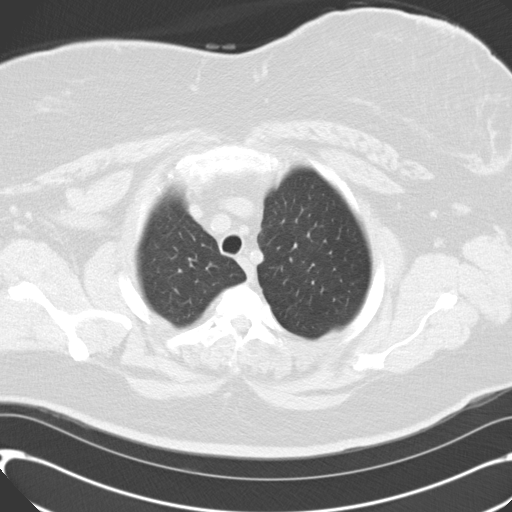
[im 54/59  lung]
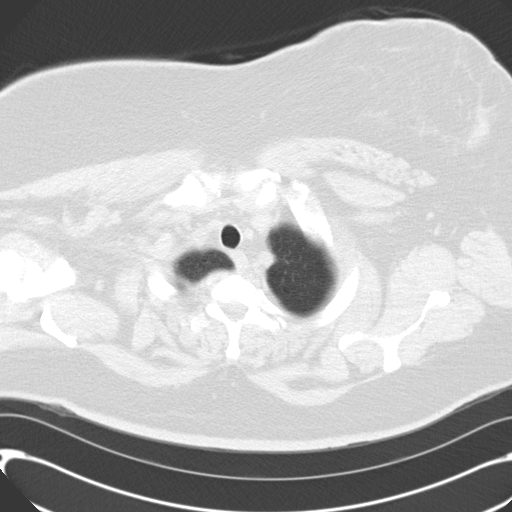

[Series 602: cor · coronal · 0.73mm/px · 3 of 132 slices shown]
[im 27/132  lung]
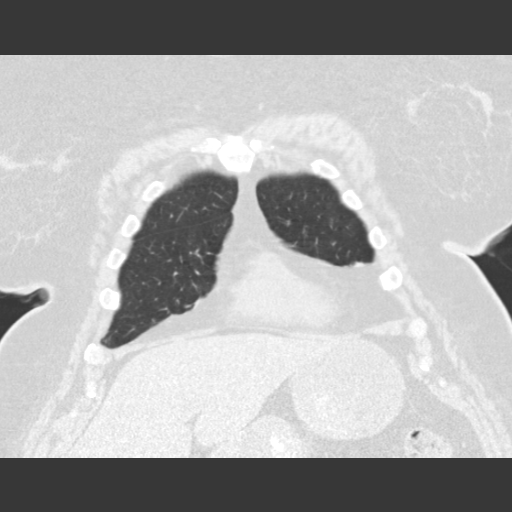
[im 53/132  lung]
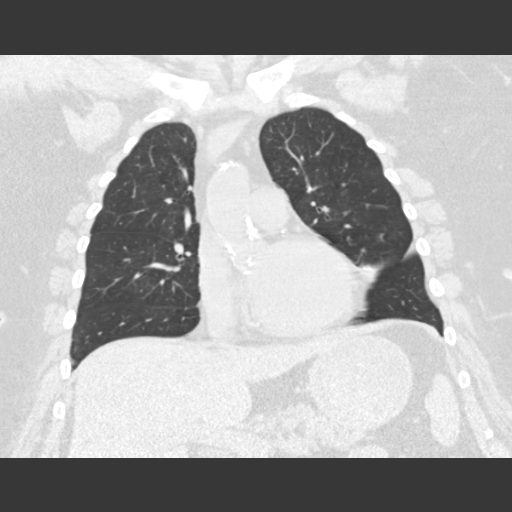
[im 79/132  lung]
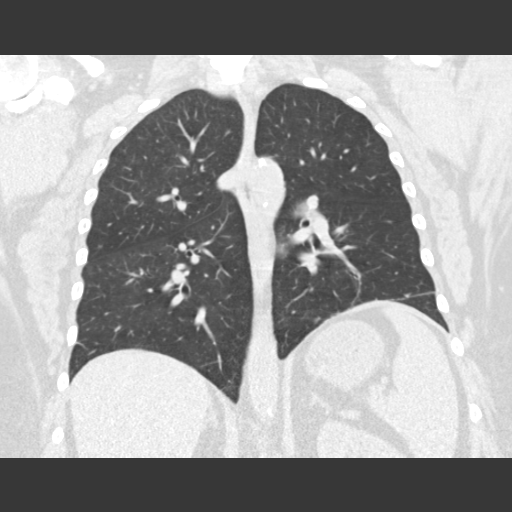

[15 of 36 positions shown; findings below may reference images not displayed]

FINDINGS: Prior 8 mm patchy/nodular opacity in the superior segment
right lower lobe has resolved, likely infectious/inflammatory.

No new/suspicious pulmonary nodules.  Mild scarring in the lingula
and left lower lobe. No pleural effusion or pneumothorax.

Visualized thyroid is unremarkable.

Heart is top normal in size.  No pericardial effusion.  Coronary
atherosclerosis.  Atherosclerotic calcifications of the aortic
arch.

Stable fluid density lesion along the anterior mediastinum (series
2/image 21), benign.

No suspicious mediastinal, hilar, or axillary lymphadenopathy.

Visualized upper abdomen is notable for cholecystectomy clips.

Degenerative changes of the visualized thoracolumbar spine.
IMPRESSION: Prior 8 mm patchy/nodular opacity in the superior segment right
lower lobe has resolved, likely infectious/inflammatory.

No evidence of acute cardiopulmonary disease.

## 2013-09-02 ENCOUNTER — Encounter: Payer: Self-pay | Admitting: Internal Medicine

## 2013-09-02 ENCOUNTER — Telehealth: Payer: Self-pay | Admitting: Internal Medicine

## 2013-09-02 NOTE — Telephone Encounter (Signed)
Notes Recorded by Nyoka Cowden, MD on 09/02/2013 at 5:54 AM Call patient : Study shows resolution of the area of concern, no more studies needed (likely just a an inflammatory process) ------  Spoke with pt and notified of results per Dr. Sherene Sires. Pt verbalized understanding and denied any questions.

## 2013-09-07 ENCOUNTER — Ambulatory Visit: Payer: Medicaid Other | Admitting: Internal Medicine

## 2013-09-12 ENCOUNTER — Encounter: Payer: Self-pay | Admitting: Internal Medicine

## 2013-09-12 ENCOUNTER — Telehealth: Payer: Self-pay | Admitting: Internal Medicine

## 2013-09-12 ENCOUNTER — Ambulatory Visit (INDEPENDENT_AMBULATORY_CARE_PROVIDER_SITE_OTHER): Payer: Medicaid Other | Admitting: Internal Medicine

## 2013-09-12 VITALS — BP 128/86 | Temp 98.5°F | Ht 63.0 in | Wt 321.6 lb

## 2013-09-12 DIAGNOSIS — G4734 Idiopathic sleep related nonobstructive alveolar hypoventilation: Secondary | ICD-10-CM

## 2013-09-12 DIAGNOSIS — J45909 Unspecified asthma, uncomplicated: Secondary | ICD-10-CM

## 2013-09-12 DIAGNOSIS — R0902 Hypoxemia: Secondary | ICD-10-CM

## 2013-09-12 DIAGNOSIS — R9389 Abnormal findings on diagnostic imaging of other specified body structures: Secondary | ICD-10-CM

## 2013-09-12 NOTE — Telephone Encounter (Signed)
Spoke to pt she is aware ono on 2lpm 02 will be done by [facility] Tobe Sos

## 2013-09-12 NOTE — Progress Notes (Signed)
Subjective:    Patient ID: AVILYN VIRTUE, female    DOB: 1961-07-12 MRN: [id number]  Brief patient profile:  52 yowf quit smoking in 2002 at wt 280 with severe sob/wheeze/ cough to point of passing out never completely improved and maintained on advair longterm but worse x 01/2013 so changed symbicort but still not back to baseline so referred 05/11/2013 to pulmonary clinic by Dr Della Goo.   History of Present Illness  05/11/2013 1st pulmonary eval cc doe x one decade but much worse x 3 months assoc dry cough on symbicort 160 but doesn't use it first thing in am and using lots of saba more day than night.   rec Plan A = automatic = symbicort 160 Take 2 puffs first thing in am and then another 2 puffs about 12 hours later follow by tudorza one twice daily  Only use your albuterol (proventil) as a rescue medication  GERD diet   06/08/2013 f/u ov/Leira Regino did not bring meds as rec Chief Complaint  Patient presents with  . Follow-up    Breathing and cough no better since the last visit. No new co's today.   using neb 3-4 day with cough worse than sob.   >pred taper, added PPI/pepcid  07/07/2013 Follow and Med review  Returns for follow up and med review .  Has medicaid but sometimes can not afford $3 to get rx.  Was not able to get medications , she does not have the money to pick up prescriptions.  Used her husband's rx of Tramadol that helps with cough.  Feels humid weather makes her breathing worse.  With mild flare cough last ov, given steroid pack , did not pick up steroids.  +snoring and daytime sleepiness Plans for right knee surgery in couple of weeks > tol well  rec Begin Prilosec and Pepcid when able.      09/12/2013 f/u ov/Vane Yapp  Chief Complaint  Patient presents with  . Follow-up    Breathing has improved some, only used neb txs a couple times since her last visit. Cough is the same- no better or worse.    not using hfa saba first as rec. No obvious daytime  variabilty or assoc chronic cough or cp or chest tightness, subjective wheeze overt sinus or hb symptoms. No unusual exp hx or h/o childhood pna/ asthma or knowledge of premature birth.   Sleeping ok without nocturnal  or early am exacerbation  of respiratory  c/o's or need for noct saba. Also denies any obvious fluctuation of symptoms with weather or environmental changes or other aggravating or alleviating factors except as outlined above    Current Medications, Allergies, Past Medical History, Past Surgical History, Family History, and Social History were reviewed in [employer] record.  ROS  The following are not active complaints unless bolded sore throat, dysphagia, dental problems, itching, sneezing,  nasal congestion or excess/ purulent secretions, ear ache,   fever, chills, sweats, unintended wt loss, pleuritic or exertional cp, hemoptysis,  orthopnea pnd or leg swelling, presyncope, palpitations, heartburn, abdominal pain, anorexia, nausea, vomiting, diarrhea  or change in bowel or urinary habits, change in stools or urine, dysuria,hematuria,  rash, arthralgias, visual complaints, headache, numbness weakness or ataxia or problems with walking or coordination,  change in mood/affect or memory.           Objective:   Physical Exam  06/08/2013      330>331 07/07/2013 >  321 09/12/2013   HEENT: nl dentition,  turbinates, and orophanx. Nl external ear canals without cough reflex   NECK :  without JVD/Nodes/TM/ nl carotid upstrokes bilaterally   LUNGS: no acc muscle use, clear to A and P bilaterally without cough on insp or exp maneuvers   CV:  RRR  no s3  But II to III/ VI sem or increase in P2, no edema   ABD:  soft and nontender with nl excursion in the supine position. No bruits or organomegaly, bowel sounds nl  MS:  warm without deformities, calf tenderness, cyanosis or clubbing  SKIN: warm and dry without lesions    NEURO:  alert, approp, no  deficits     cta chest 02/21/13 No evidence of pulmonary embolism.  8 mm patchy/nodular opacity in the superior segment right lower  lobe, favored to be infectious/inflammatory See   09/01/13 CT: resolved        Assessment & Plan:

## 2013-09-12 NOTE — Patient Instructions (Addendum)
Continue symbicort 160 Take 2 puffs first thing in am and then another 2 puffs about 12 hours later.     Only use your albuterol as a rescue medication to be used if you can't catch your breath by resting or doing a relaxed purse lip breathing pattern. The less you use it, the better it will work when you need it.   Continue 02 at bedtime at 2lpm indefinitely (weight loss may eventually allow you to stop it but we'll need to do another overnight study to verify first)  Please see patient coordinator before you leave today  to schedule overnight study now on 2lpm    See calendar for specific medication instructions and bring it back for each and every office visit for every healthcare provider you see.  Without it,  you may not receive the best quality medical care that we feel you deserve.  You will note that the calendar groups together  your maintenance  medications that are timed at particular times of the day.  Think of this as your checklist for what your doctor has instructed you to do until your next evaluation to see what benefit  there is  to staying on a consistent group of medications intended to keep you well.  The other group at the bottom is entirely up to you to use as you see fit  for specific symptoms that may arise between visits that require you to treat them on an as needed basis.  Think of this as your action plan or "what if" list.   Separating the top medications from the bottom group is fundamental to providing you adequate care going forward.    Follow up is as needed

## 2013-09-13 NOTE — Assessment & Plan Note (Signed)
-   Spirometry 06/08/2013 >>> no airflow obstruction -med calendar 07/07/2013   The proper method of use, as well as anticipated side effects, of a metered-dose inhaler are discussed and demonstrated to the patient. Improved effectiveness after extensive coaching during this visit to a level of approximately  90%  Not really clear how much asthma is really present but given her limited ventilatory reserve related to obesity it makes sense to treat this component consistently with symbicort 160 2bid    Each maintenance medication was reviewed in detail including most importantly the difference between maintenance and as needed and under what circumstances the prns are to be used. This was done in the context of a medication calendar review which provided the patient with a user-friendly unambiguous mechanism for medication administration and reconciliation and provides an action plan for all active problems. It is critical that this be shown to every doctor  for modification during the office visit if necessary so the patient can use it as a working document.

## 2013-09-13 NOTE — Assessment & Plan Note (Addendum)
See CT chest 02/21/13 >  09/01/13 CT: resolved, no further f/u planned

## 2013-09-13 NOTE — Assessment & Plan Note (Addendum)
-  ONO 07/07/2013 >Positive (>42 min <88%) >begin O2 At bedtime  2 l/m (07/14/2013 ) > recheck on 2lpm ordered 09/12/13  -set up for sleep study. 07/14/2013  >>min OSA (AHI 5)   Her main problem is obesity and as long as not able to reverse  this likely to need noct 02

## 2013-09-21 ENCOUNTER — Inpatient Hospital Stay (HOSPITAL_COMMUNITY): Payer: Medicaid Other

## 2013-09-21 ENCOUNTER — Other Ambulatory Visit: Payer: Self-pay | Admitting: Urology

## 2013-09-21 ENCOUNTER — Inpatient Hospital Stay (HOSPITAL_COMMUNITY): Payer: Medicaid Other | Admitting: Certified Registered"

## 2013-09-21 ENCOUNTER — Encounter (HOSPITAL_COMMUNITY)
Admission: EM | Disposition: A | Discharge status: Home / Self Care | Payer: Self-pay | Source: Home / Self Care | Attending: Internal Medicine

## 2013-09-21 ENCOUNTER — Inpatient Hospital Stay (HOSPITAL_COMMUNITY)
Admission: EM | Admit: 2013-09-21 | Discharge: 2013-09-24 | DRG: 694 | Disposition: A | Discharge status: Home / Self Care | Payer: Medicaid Other | Attending: Internal Medicine | Admitting: Internal Medicine

## 2013-09-21 ENCOUNTER — Encounter (HOSPITAL_COMMUNITY): Payer: Self-pay | Admitting: *Deleted

## 2013-09-21 ENCOUNTER — Encounter (HOSPITAL_COMMUNITY): Payer: Self-pay | Admitting: Certified Registered"

## 2013-09-21 ENCOUNTER — Emergency Department (HOSPITAL_COMMUNITY): Payer: Medicaid Other

## 2013-09-21 DIAGNOSIS — E039 Hypothyroidism, unspecified: Secondary | ICD-10-CM | POA: Diagnosis present

## 2013-09-21 DIAGNOSIS — J449 Chronic obstructive pulmonary disease, unspecified: Secondary | ICD-10-CM | POA: Diagnosis present

## 2013-09-21 DIAGNOSIS — J4489 Other specified chronic obstructive pulmonary disease: Secondary | ICD-10-CM | POA: Diagnosis present

## 2013-09-21 DIAGNOSIS — N139 Obstructive and reflux uropathy, unspecified: Secondary | ICD-10-CM | POA: Diagnosis present

## 2013-09-21 DIAGNOSIS — N1330 Unspecified hydronephrosis: Secondary | ICD-10-CM | POA: Diagnosis present

## 2013-09-21 DIAGNOSIS — G2581 Restless legs syndrome: Secondary | ICD-10-CM | POA: Diagnosis present

## 2013-09-21 DIAGNOSIS — F329 Major depressive disorder, single episode, unspecified: Secondary | ICD-10-CM | POA: Diagnosis present

## 2013-09-21 DIAGNOSIS — E119 Type 2 diabetes mellitus without complications: Secondary | ICD-10-CM | POA: Diagnosis present

## 2013-09-21 DIAGNOSIS — E669 Obesity, unspecified: Secondary | ICD-10-CM | POA: Diagnosis present

## 2013-09-21 DIAGNOSIS — Z86711 Personal history of pulmonary embolism: Secondary | ICD-10-CM

## 2013-09-21 DIAGNOSIS — N138 Other obstructive and reflux uropathy: Secondary | ICD-10-CM | POA: Diagnosis present

## 2013-09-21 DIAGNOSIS — K219 Gastro-esophageal reflux disease without esophagitis: Secondary | ICD-10-CM | POA: Diagnosis present

## 2013-09-21 DIAGNOSIS — G4733 Obstructive sleep apnea (adult) (pediatric): Secondary | ICD-10-CM | POA: Diagnosis present

## 2013-09-21 DIAGNOSIS — I252 Old myocardial infarction: Secondary | ICD-10-CM

## 2013-09-21 DIAGNOSIS — I10 Essential (primary) hypertension: Secondary | ICD-10-CM | POA: Diagnosis present

## 2013-09-21 DIAGNOSIS — N23 Unspecified renal colic: Secondary | ICD-10-CM

## 2013-09-21 DIAGNOSIS — N200 Calculus of kidney: Secondary | ICD-10-CM | POA: Diagnosis present

## 2013-09-21 DIAGNOSIS — D72829 Elevated white blood cell count, unspecified: Secondary | ICD-10-CM | POA: Diagnosis present

## 2013-09-21 DIAGNOSIS — Z6841 Body Mass Index (BMI) 40.0 and over, adult: Secondary | ICD-10-CM

## 2013-09-21 DIAGNOSIS — B9620 Unspecified Escherichia coli [E. coli] as the cause of diseases classified elsewhere: Secondary | ICD-10-CM | POA: Diagnosis present

## 2013-09-21 DIAGNOSIS — F3289 Other specified depressive episodes: Secondary | ICD-10-CM | POA: Diagnosis present

## 2013-09-21 DIAGNOSIS — A498 Other bacterial infections of unspecified site: Secondary | ICD-10-CM | POA: Diagnosis present

## 2013-09-21 DIAGNOSIS — I2510 Atherosclerotic heart disease of native coronary artery without angina pectoris: Secondary | ICD-10-CM | POA: Diagnosis present

## 2013-09-21 DIAGNOSIS — Z79899 Other long term (current) drug therapy: Secondary | ICD-10-CM

## 2013-09-21 DIAGNOSIS — E785 Hyperlipidemia, unspecified: Secondary | ICD-10-CM | POA: Diagnosis present

## 2013-09-21 DIAGNOSIS — N201 Calculus of ureter: Principal | ICD-10-CM | POA: Diagnosis present

## 2013-09-21 DIAGNOSIS — Z96659 Presence of unspecified artificial knee joint: Secondary | ICD-10-CM

## 2013-09-21 DIAGNOSIS — N390 Urinary tract infection, site not specified: Secondary | ICD-10-CM | POA: Diagnosis present

## 2013-09-21 DIAGNOSIS — N10 Acute tubulo-interstitial nephritis: Secondary | ICD-10-CM | POA: Diagnosis present

## 2013-09-21 DIAGNOSIS — Z87891 Personal history of nicotine dependence: Secondary | ICD-10-CM

## 2013-09-21 HISTORY — DX: Chronic obstructive pulmonary disease, unspecified: J44.9

## 2013-09-21 HISTORY — PX: CYSTOSCOPY W/ RETROGRADES: SHX1426

## 2013-09-21 LAB — GLUCOSE, CAPILLARY
Glucose-Capillary: 162 mg/dL — ABNORMAL HIGH (ref 70–99)
Glucose-Capillary: 176 mg/dL — ABNORMAL HIGH (ref 70–99)
Glucose-Capillary: 134 mg/dL — ABNORMAL HIGH (ref 70–99)

## 2013-09-21 LAB — COMPREHENSIVE METABOLIC PANEL WITH GFR
ALT: 13 U/L (ref 0–35)
AST: 19 U/L (ref 0–37)
Albumin: 3.5 g/dL (ref 3.5–5.2)
Alkaline Phosphatase: 80 U/L (ref 39–117)
BUN: 16 mg/dL (ref 6–23)
CO2: 28 meq/L (ref 19–32)
Calcium: 8.8 mg/dL (ref 8.4–10.5)
Chloride: 102 meq/L (ref 96–112)
Creatinine, Ser: 0.81 mg/dL (ref 0.50–1.10)
GFR calc Af Amer: >90 mL/min
GFR calc non Af Amer: 81 mL/min — ABNORMAL LOW
Glucose, Bld: 194 mg/dL — ABNORMAL HIGH (ref 70–99)
Potassium: 4.2 meq/L (ref 3.5–5.1)
Sodium: 142 meq/L (ref 135–145)
Total Bilirubin: 0.6 mg/dL (ref 0.3–1.2)
Total Protein: 6.8 g/dL (ref 6.0–8.3)

## 2013-09-21 LAB — LIPASE, BLOOD: Lipase: 29 U/L (ref 11–59)

## 2013-09-21 LAB — URINALYSIS, ROUTINE W REFLEX MICROSCOPIC
Ketones, ur: NEGATIVE mg/dL
Nitrite: POSITIVE — AB
Specific Gravity, Urine: 1.028 (ref 1.005–1.030)
Urobilinogen, UA: 0.2 mg/dL (ref 0.0–1.0)
pH: 5 (ref 5.0–8.0)

## 2013-09-21 LAB — CBC WITH DIFFERENTIAL/PLATELET
Basophils Absolute: 0 10*3/uL (ref 0.0–0.1)
Basophils Relative: 0 % (ref 0–1)
Eosinophils Absolute: 0.3 10*3/uL (ref 0.0–0.7)
Eosinophils Relative: 3 % (ref 0–5)
HCT: 37.2 % (ref 36.0–46.0)
Hemoglobin: 12.4 g/dL (ref 12.0–15.0)
Lymphocytes Relative: 22 % (ref 12–46)
Lymphs Abs: 2.2 10*3/uL (ref 0.7–4.0)
MCH: 27.7 pg (ref 26.0–34.0)
MCHC: 33.3 g/dL (ref 30.0–36.0)
MCV: 83 fL (ref 78.0–100.0)
Monocytes Absolute: 0.6 10*3/uL (ref 0.1–1.0)
Monocytes Relative: 6 % (ref 3–12)
Neutro Abs: 6.9 10*3/uL (ref 1.7–7.7)
Neutrophils Relative %: 69 % (ref 43–77)
Platelets: 213 10*3/uL (ref 150–400)
RBC: 4.48 MIL/uL (ref 3.87–5.11)
RDW: 14.1 % (ref 11.5–15.5)
WBC: 10.1 10*3/uL (ref 4.0–10.5)

## 2013-09-21 LAB — CBC
HCT: 37.9 % (ref 36.0–46.0)
Hemoglobin: 12.1 g/dL (ref 12.0–15.0)
MCH: 26.7 pg (ref 26.0–34.0)
MCV: 83.7 fL (ref 78.0–100.0)
Platelets: 196 10*3/uL (ref 150–400)
RDW: 14 % (ref 11.5–15.5)
WBC: 21 10*3/uL — ABNORMAL HIGH (ref 4.0–10.5)

## 2013-09-21 LAB — URINE MICROSCOPIC-ADD ON

## 2013-09-21 LAB — CREATININE, SERUM: GFR calc Af Amer: 62 mL/min — ABNORMAL LOW (ref >90)

## 2013-09-21 LAB — HEMOGLOBIN A1C: Hgb A1c MFr Bld: 6.5 % — ABNORMAL HIGH (ref <5.7)

## 2013-09-21 LAB — TSH: TSH: 2.918 u[IU]/mL (ref 0.350–4.500)

## 2013-09-21 LAB — SURGICAL PCR SCREEN: MRSA, PCR: NEGATIVE

## 2013-09-21 IMAGING — CT CT ABD-PELV W/O CM
2 of 4 series · 17 of 46 positions shown, 19 images · non-contrast
Comparison: [DATE]

CLINICAL DATA: Right flank pain.

EXAM:
CT ABDOMEN AND PELVIS WITHOUT CONTRAST
TECHNIQUE: Multidetector CT imaging of the abdomen and pelvis was performed
following the standard protocol without intravenous contrast.

[Series 2: stone study 5.0 i30f 1 · axial · 0.98mm/px · z∈[-556,-116]mm · 14 of 96 slices shown, 16 images]
[im 4/96  soft-tissue]
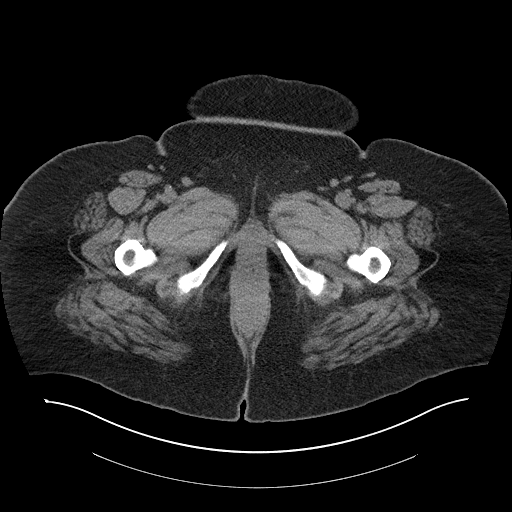
[im 4/96  bone]
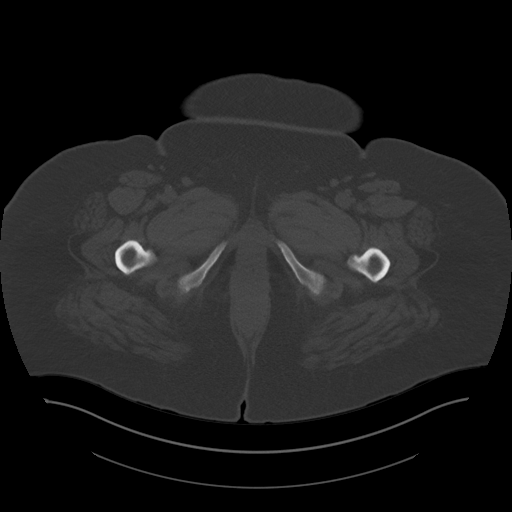
[im 12/96  soft-tissue]
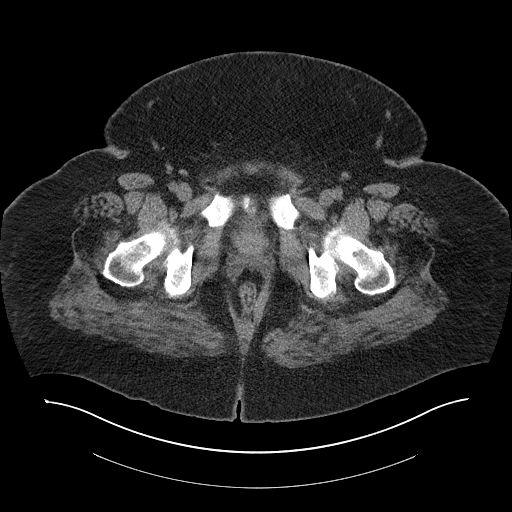
[im 20/96  soft-tissue]
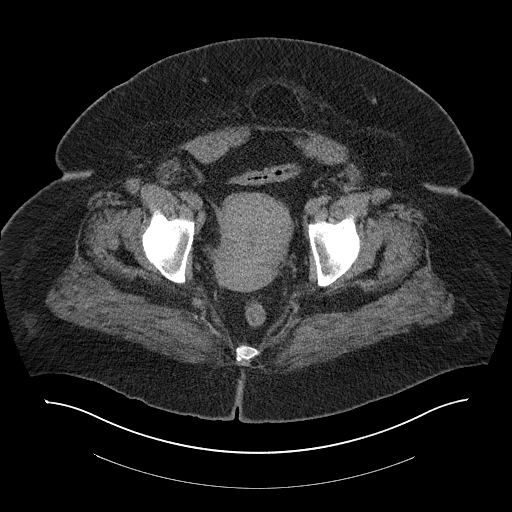
[im 24/96  soft-tissue]
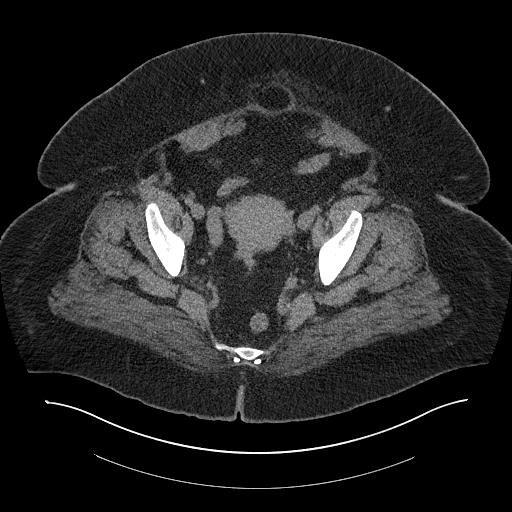
[im 32/96  soft-tissue]
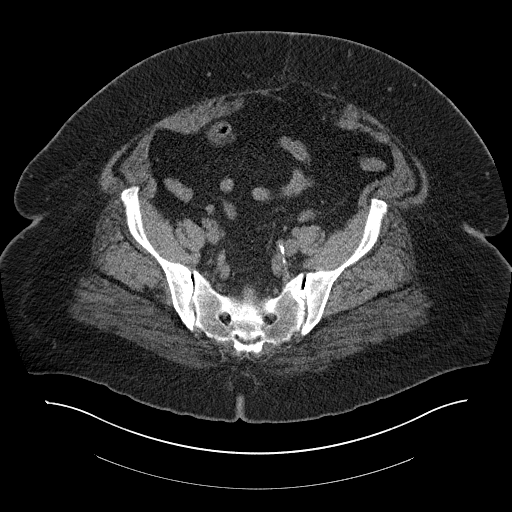
[im 40/96  soft-tissue]
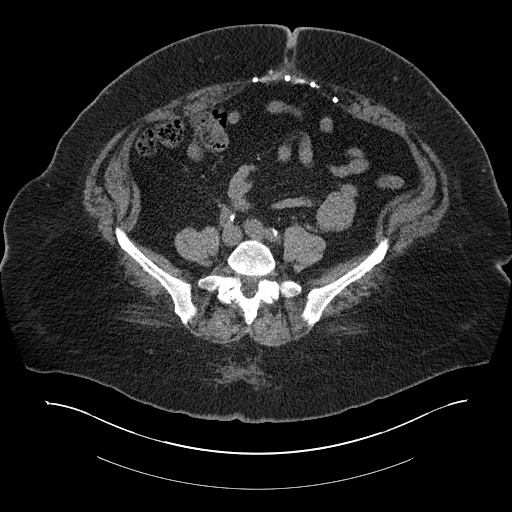
[im 44/96  soft-tissue]
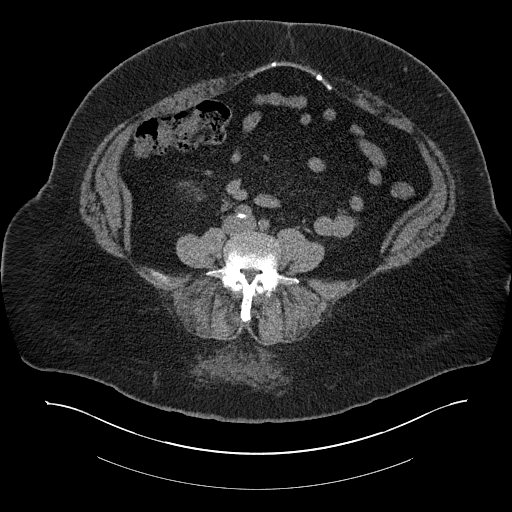
[im 52/96  soft-tissue]
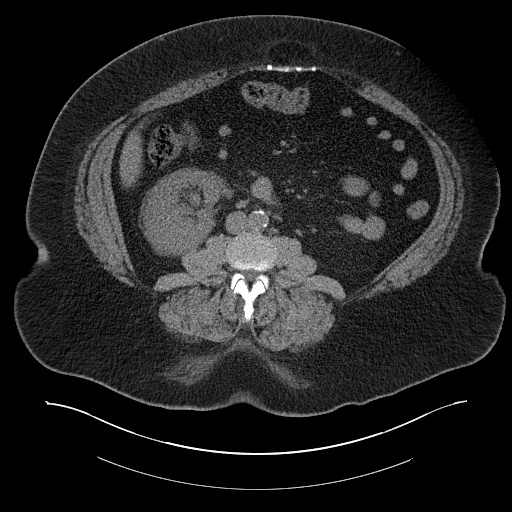
[im 56/96  soft-tissue]
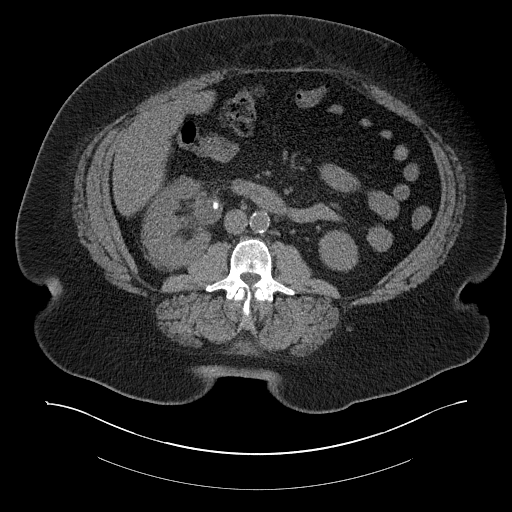
[im 56/96  bone]
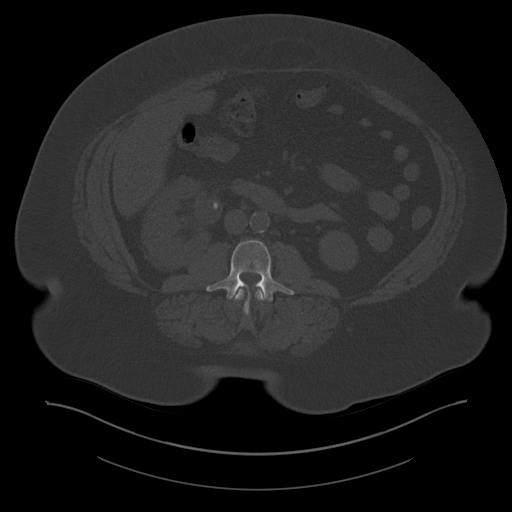
[im 64/96  soft-tissue]
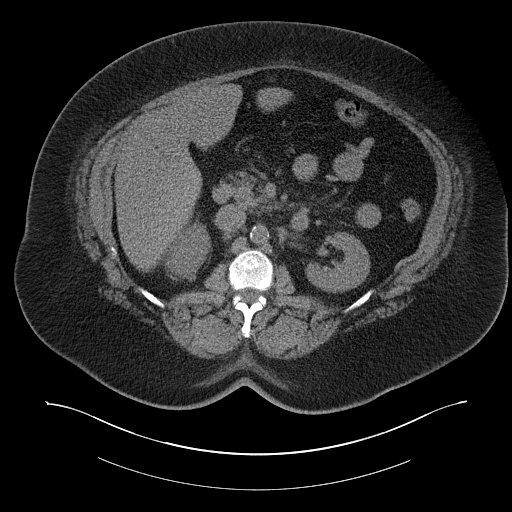
[im 72/96  soft-tissue]
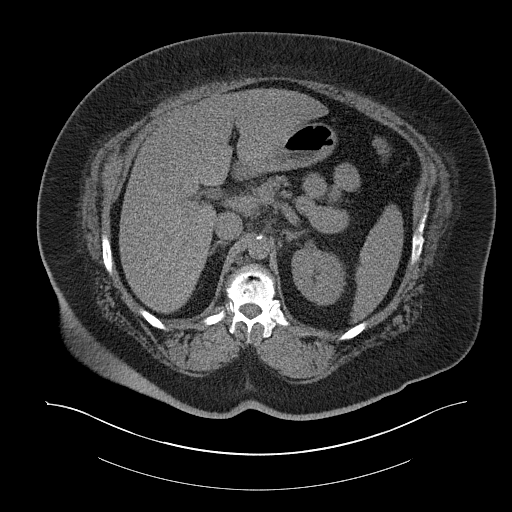
[im 76/96  soft-tissue]
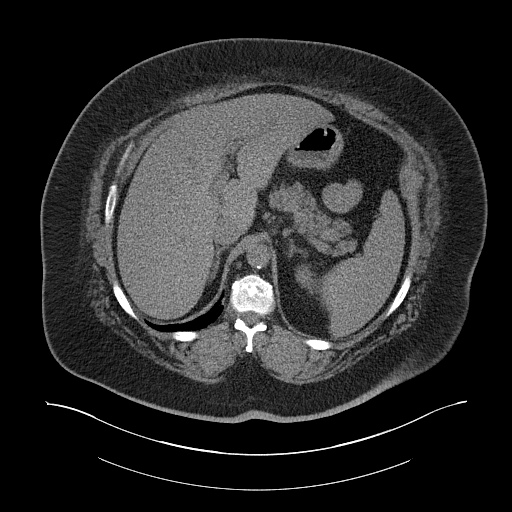
[im 84/96  soft-tissue]
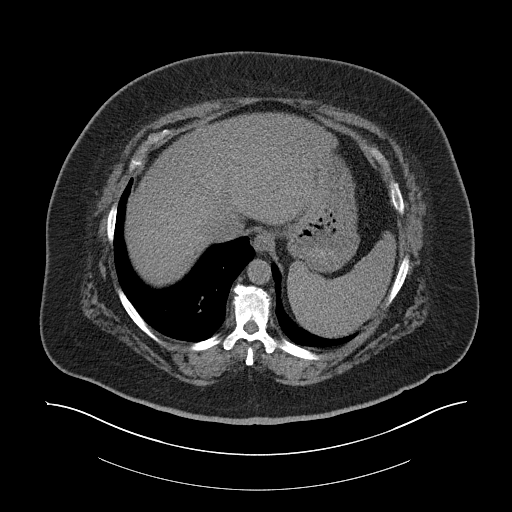
[im 92/96  soft-tissue]
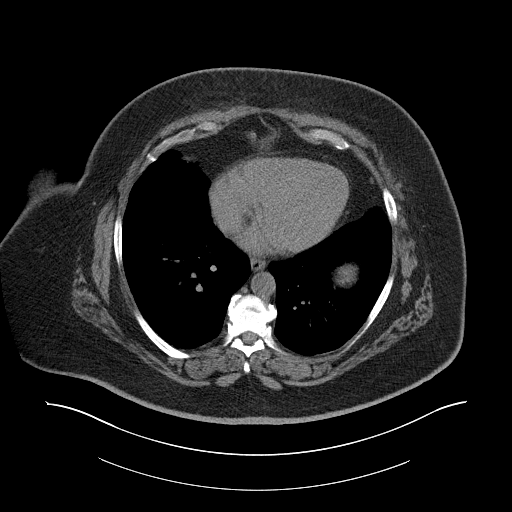

[Series 5: coronal soft tissue · coronal · 1.07mm/px · 3 of 135 slices shown]
[im 45/135  soft-tissue]
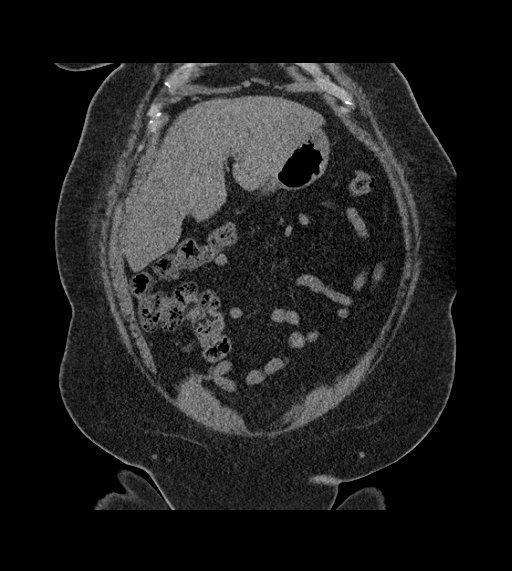
[im 60/135  soft-tissue]
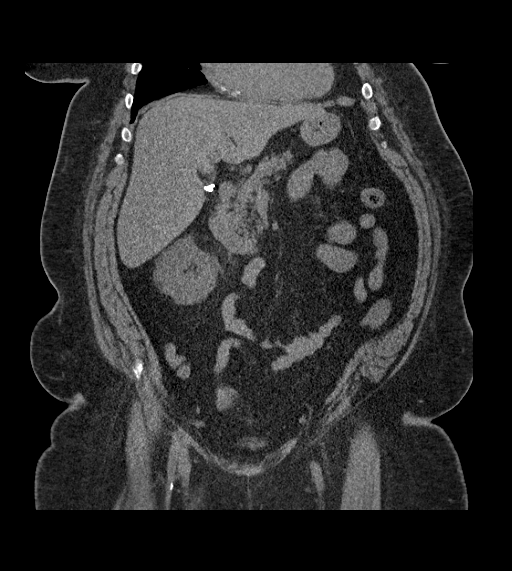
[im 75/135  soft-tissue]
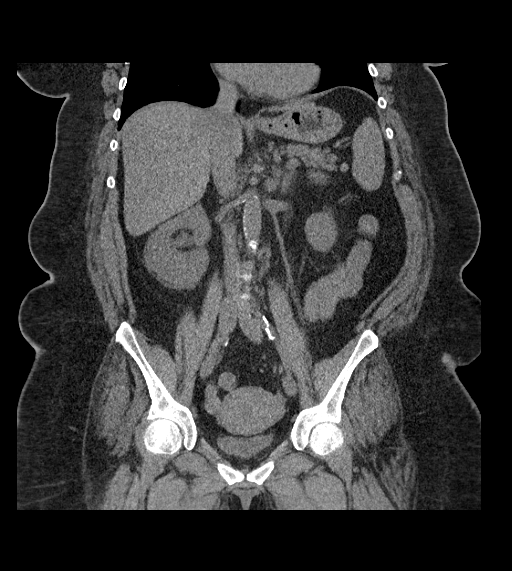

[17 of 46 positions shown; findings below may reference images not displayed]

FINDINGS: BODY WALL: Status post ventral abdominal wall hernia repair using
mesh. As noted previously, there is fatty herniation at the upper
and lower margins of the mesh.

LOWER CHEST:

Mediastinum: Coronary artery atherosclerosis and probable mild
cardiomegaly.

ABDOMEN/PELVIS:

Liver: Diffuse fatty infiltration.

Biliary: Cholecystectomy.

Pancreas: Unremarkable.

Spleen: Unremarkable.

Adrenals: Unremarkable.

Kidneys and ureters: 9 mm stone at the right ureteropelvic junction
with proximal hydronephrosis, ipsilateral renomegaly, and
ipsilateral perinephric edema. There is a punctate stone associated
with the lower pole left kidney, nonobstructive.

Bladder: Unremarkable.

Bowel: No obstruction.

Retroperitoneum: 2 cm long nodule or lymph node, left periaortic at
the level of the upper pole left kidney, unchanged from prior.

Peritoneum: No free fluid or gas.

Reproductive: Unremarkable.

Vascular: No acute abnormality.

OSSEOUS: No acute abnormalities. No suspicious lytic or blastic
lesions.
IMPRESSION: 1. 9 mm right ureteropelvic junction calculus with obstructive
uropathy.
2. Ventral abdominal wall hernia repair using mesh. There is chronic
fat herniation superior and inferior to the repair.

## 2013-09-21 IMAGING — DX DG CHEST 1V PORT
1 series · 1 of 1 positions shown · non-contrast
Comparison: Chest x-ray of [DATE] and CT chest of [DATE]

CLINICAL DATA: Short of breath, history of asthma

EXAM:
PORTABLE CHEST - 1 VIEW

[portable]
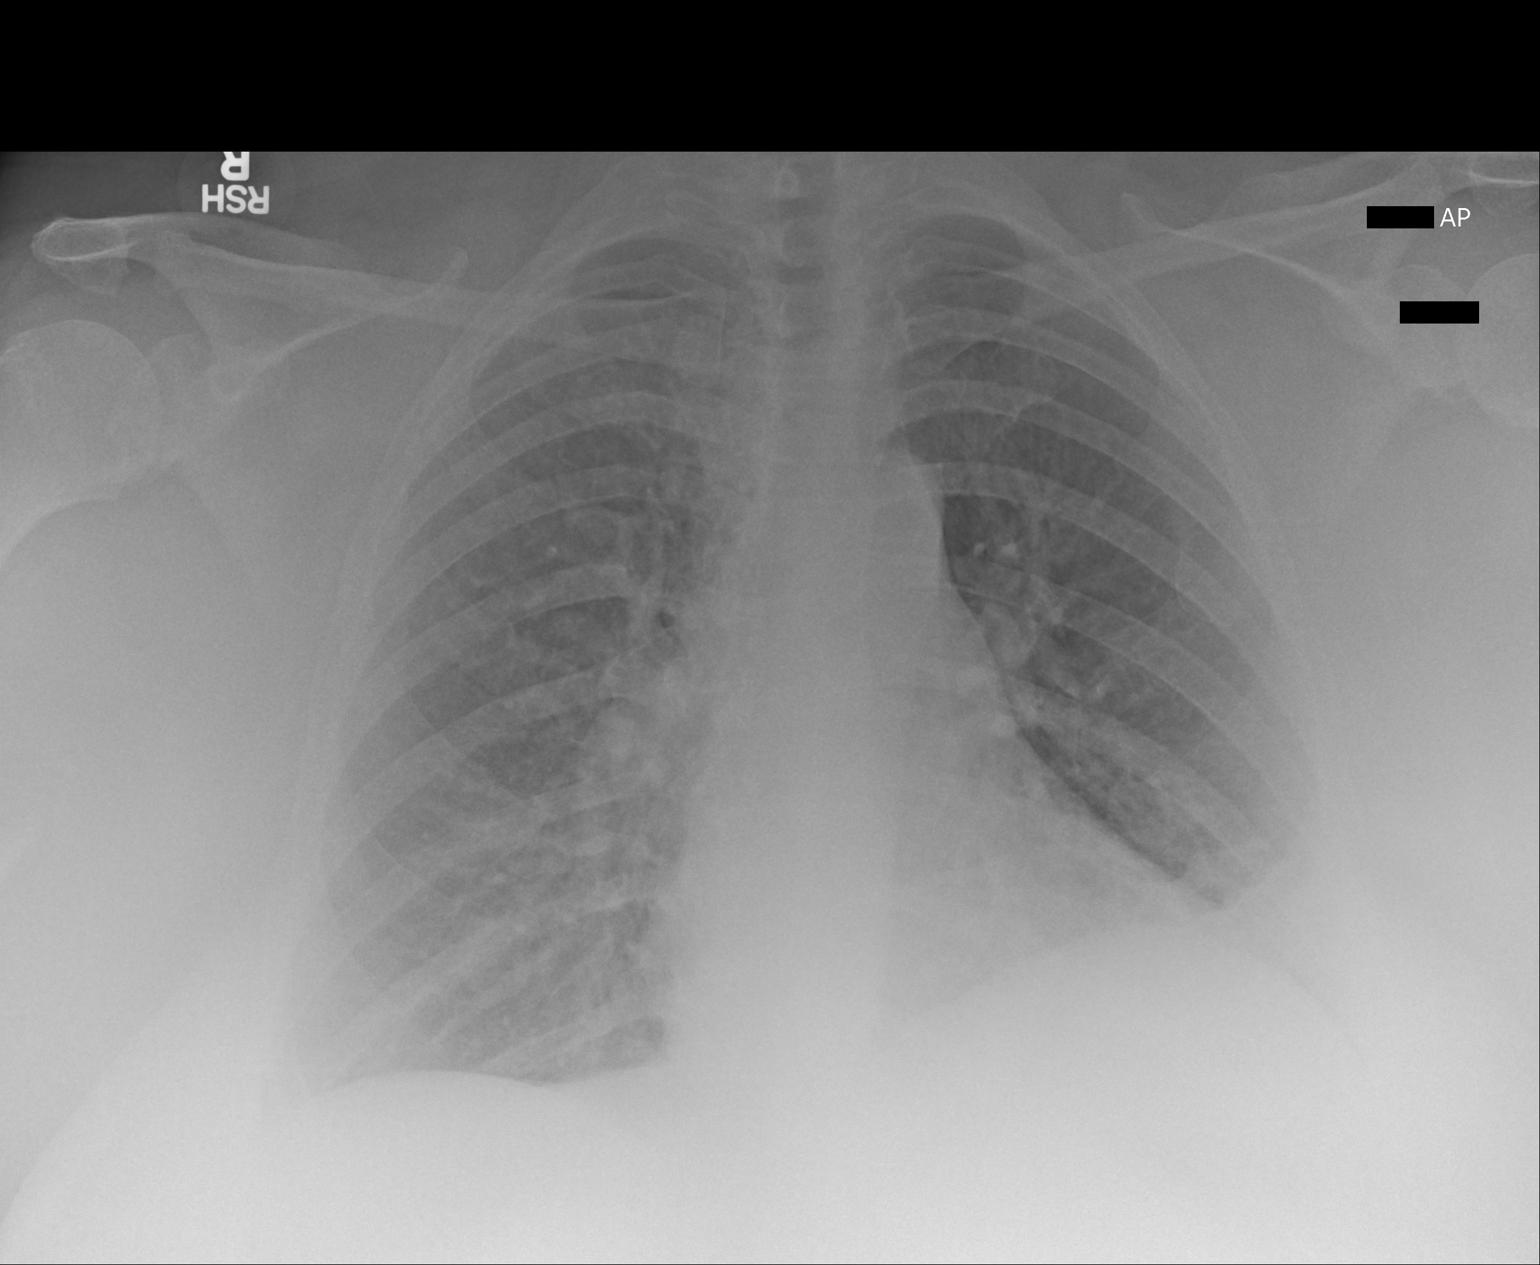

[1 of 1 positions shown; findings below may reference images not displayed]

FINDINGS: Minimal opacity at the left lung base probably represent scarring.
No definite effusion is seen. The heart is mildly enlarged. No bony
abnormality is noted.
IMPRESSION: Probable scarring at the left lung base. No definite active process.

## 2013-09-21 SURGERY — CYSTOSCOPY, WITH RETROGRADE PYELOGRAM
Anesthesia: General | Site: Ureter | Laterality: Right | Wound class: Clean Contaminated

## 2013-09-21 MED ORDER — FERROUS SULFATE 325 (65 FE) MG PO TABS
325.0000 mg | ORAL_TABLET | Freq: Three times a day (TID) | ORAL | Status: DC
  Filled 2013-09-21 (×12): qty 1

## 2013-09-21 MED ORDER — INSULIN ASPART 100 UNIT/ML ~~LOC~~ SOLN
0.0000 [IU] | Freq: Three times a day (TID) | SUBCUTANEOUS | Status: DC
  Administered 2013-09-22: 1 [IU] via SUBCUTANEOUS

## 2013-09-21 MED ORDER — MIDAZOLAM HCL 5 MG/5ML IJ SOLN
INTRAMUSCULAR | Status: DC | PRN
  Administered 2013-09-21 (×2): 1 mg via INTRAVENOUS

## 2013-09-21 MED ORDER — ESCITALOPRAM OXALATE 20 MG PO TABS
20.0000 mg | ORAL_TABLET | Freq: Every morning | ORAL | Status: DC
  Administered 2013-09-22 – 2013-09-24 (×3): 20 mg via ORAL
  Filled 2013-09-21 (×4): qty 1

## 2013-09-21 MED ORDER — 0.9 % SODIUM CHLORIDE (POUR BTL) OPTIME
TOPICAL | Status: DC | PRN
  Administered 2013-09-21: 1000 mL

## 2013-09-21 MED ORDER — MORPHINE SULFATE 2 MG/ML IJ SOLN: 2.0000 mg | INTRAMUSCULAR | Status: DC | PRN

## 2013-09-21 MED ORDER — EZETIMIBE 10 MG PO TABS
10.0000 mg | ORAL_TABLET | Freq: Every evening | ORAL | Status: DC
  Administered 2013-09-22 – 2013-09-24 (×3): 10 mg via ORAL
  Filled 2013-09-21 (×4): qty 1

## 2013-09-21 MED ORDER — ENOXAPARIN SODIUM 40 MG/0.4ML ~~LOC~~ SOLN
40.0000 mg | SUBCUTANEOUS | Status: DC
  Filled 2013-09-21: qty 0.4

## 2013-09-21 MED ORDER — PANTOPRAZOLE SODIUM 40 MG PO TBEC
40.0000 mg | DELAYED_RELEASE_TABLET | Freq: Every day | ORAL | Status: DC
  Administered 2013-09-22 – 2013-09-24 (×3): 40 mg via ORAL
  Filled 2013-09-21 (×3): qty 1

## 2013-09-21 MED ORDER — ASPIRIN EC 325 MG PO TBEC
325.0000 mg | DELAYED_RELEASE_TABLET | Freq: Two times a day (BID) | ORAL | Status: DC
  Administered 2013-09-21 – 2013-09-24 (×6): 325 mg via ORAL
  Filled 2013-09-21 (×8): qty 1

## 2013-09-21 MED ORDER — KETOROLAC TROMETHAMINE 30 MG/ML IJ SOLN
30.0000 mg | Freq: Once | INTRAMUSCULAR | Status: AC
  Administered 2013-09-21: 30 mg via INTRAVENOUS
  Filled 2013-09-21: qty 1

## 2013-09-21 MED ORDER — STERILE WATER FOR IRRIGATION IR SOLN
Status: DC | PRN
  Administered 2013-09-21: 3000 mL

## 2013-09-21 MED ORDER — KCL IN DEXTROSE-NACL 20-5-0.9 MEQ/L-%-% IV SOLN
INTRAVENOUS | Status: DC
  Administered 2013-09-21 (×2): via INTRAVENOUS
  Filled 2013-09-21 (×4): qty 1000

## 2013-09-21 MED ORDER — BELLADONNA ALKALOIDS-OPIUM 16.2-60 MG RE SUPP
RECTAL | Status: AC
  Filled 2013-09-21: qty 1

## 2013-09-21 MED ORDER — ONDANSETRON HCL 4 MG/2ML IJ SOLN
4.0000 mg | Freq: Once | INTRAMUSCULAR | Status: AC
  Administered 2013-09-21: 4 mg via INTRAVENOUS
  Filled 2013-09-21: qty 2

## 2013-09-21 MED ORDER — FENTANYL CITRATE 0.05 MG/ML IJ SOLN
INTRAMUSCULAR | Status: DC | PRN
  Administered 2013-09-21: 25 ug via INTRAVENOUS

## 2013-09-21 MED ORDER — SODIUM CHLORIDE 0.9 % IJ SOLN
3.0000 mL | Freq: Two times a day (BID) | INTRAMUSCULAR | Status: DC
  Administered 2013-09-22 – 2013-09-24 (×5): 3 mL via INTRAVENOUS

## 2013-09-21 MED ORDER — CIPROFLOXACIN IN D5W 400 MG/200ML IV SOLN
400.0000 mg | Freq: Once | INTRAVENOUS | Status: AC
  Administered 2013-09-21: 400 mg via INTRAVENOUS
  Filled 2013-09-21: qty 200

## 2013-09-21 MED ORDER — CYCLOBENZAPRINE HCL 10 MG PO TABS
10.0000 mg | ORAL_TABLET | Freq: Three times a day (TID) | ORAL | Status: DC | PRN
  Filled 2013-09-21: qty 1

## 2013-09-21 MED ORDER — ONDANSETRON HCL 4 MG/2ML IJ SOLN
INTRAMUSCULAR | Status: DC | PRN
  Administered 2013-09-21: 4 mg via INTRAVENOUS

## 2013-09-21 MED ORDER — ALBUTEROL SULFATE (5 MG/ML) 0.5% IN NEBU
2.5000 mg | INHALATION_SOLUTION | RESPIRATORY_TRACT | Status: DC | PRN
  Administered 2013-09-21 – 2013-09-23 (×5): 2.5 mg via RESPIRATORY_TRACT
  Filled 2013-09-21 (×5): qty 0.5

## 2013-09-21 MED ORDER — CEFTRIAXONE SODIUM 1 G IJ SOLR
1.0000 g | Freq: Once | INTRAMUSCULAR | Status: AC
  Administered 2013-09-21: 1 g via INTRAVENOUS
  Filled 2013-09-21: qty 10

## 2013-09-21 MED ORDER — LIDOCAINE HCL (CARDIAC) 20 MG/ML IV SOLN
INTRAVENOUS | Status: DC | PRN
  Administered 2013-09-21: 100 mg via INTRAVENOUS

## 2013-09-21 MED ORDER — ATORVASTATIN CALCIUM 80 MG PO TABS
80.0000 mg | ORAL_TABLET | Freq: Every day | ORAL | Status: DC
  Administered 2013-09-22 – 2013-09-23 (×2): 80 mg via ORAL
  Filled 2013-09-21 (×4): qty 1

## 2013-09-21 MED ORDER — CIPROFLOXACIN IN D5W 400 MG/200ML IV SOLN: 400.0000 mg | Freq: Once | INTRAVENOUS | Status: DC

## 2013-09-21 MED ORDER — PROPOFOL 10 MG/ML IV BOLUS
INTRAVENOUS | Status: DC | PRN
  Administered 2013-09-21: 200 mg via INTRAVENOUS
  Administered 2013-09-21: 100 mg via INTRAVENOUS

## 2013-09-21 MED ORDER — IOHEXOL 300 MG/ML  SOLN
INTRAMUSCULAR | Status: DC | PRN
  Administered 2013-09-21: 24 mL via URETHRAL

## 2013-09-21 MED ORDER — HYDROMORPHONE HCL PF 1 MG/ML IJ SOLN
1.0000 mg | Freq: Once | INTRAMUSCULAR | Status: AC
  Administered 2013-09-21: 1 mg via INTRAVENOUS
  Filled 2013-09-21: qty 1

## 2013-09-21 MED ORDER — LACTATED RINGERS IV SOLN: INTRAVENOUS | Status: DC

## 2013-09-21 MED ORDER — CLOPIDOGREL BISULFATE 75 MG PO TABS
75.0000 mg | ORAL_TABLET | Freq: Every morning | ORAL | Status: DC
  Filled 2013-09-21: qty 1

## 2013-09-21 MED ORDER — PROMETHAZINE HCL 25 MG/ML IJ SOLN: 6.2500 mg | INTRAMUSCULAR | Status: DC | PRN

## 2013-09-21 MED ORDER — FAMOTIDINE 20 MG PO TABS
20.0000 mg | ORAL_TABLET | Freq: Every day | ORAL | Status: DC
  Administered 2013-09-21: 20 mg via ORAL
  Filled 2013-09-21 (×2): qty 1

## 2013-09-21 MED ORDER — GABAPENTIN 300 MG PO CAPS
300.0000 mg | ORAL_CAPSULE | Freq: Two times a day (BID) | ORAL | Status: DC
  Administered 2013-09-22 – 2013-09-24 (×5): 300 mg via ORAL
  Filled 2013-09-21 (×8): qty 1

## 2013-09-21 MED ORDER — LIDOCAINE HCL 2 % EX GEL
CUTANEOUS | Status: AC
  Filled 2013-09-21: qty 10

## 2013-09-21 MED ORDER — HYDROMORPHONE HCL PF 1 MG/ML IJ SOLN: 0.2500 mg | INTRAMUSCULAR | Status: DC | PRN

## 2013-09-21 MED ORDER — LACTATED RINGERS IV SOLN
INTRAVENOUS | Status: DC
  Administered 2013-09-21: 1000 mL via INTRAVENOUS

## 2013-09-21 MED ORDER — DEXTROSE 5 % IV SOLN
1.0000 g | INTRAVENOUS | Status: DC
  Administered 2013-09-22 – 2013-09-23 (×2): 1 g via INTRAVENOUS
  Filled 2013-09-21 (×2): qty 10

## 2013-09-21 MED ORDER — SUCCINYLCHOLINE CHLORIDE 20 MG/ML IJ SOLN
INTRAMUSCULAR | Status: DC | PRN
  Administered 2013-09-21: 70 mg via INTRAVENOUS

## 2013-09-21 MED ORDER — SODIUM CHLORIDE 0.9 % IV BOLUS (SEPSIS)
500.0000 mL | Freq: Once | INTRAVENOUS | Status: AC
  Administered 2013-09-21: 500 mL via INTRAVENOUS

## 2013-09-21 MED ORDER — BUDESONIDE-FORMOTEROL FUMARATE 160-4.5 MCG/ACT IN AERO
2.0000 | INHALATION_SPRAY | Freq: Two times a day (BID) | RESPIRATORY_TRACT | Status: DC
Start: 2013-09-21 — End: 2013-09-25
  Administered 2013-09-21 – 2013-09-24 (×6): 2 via RESPIRATORY_TRACT
  Filled 2013-09-21 (×3): qty 6

## 2013-09-21 MED ORDER — LOSARTAN POTASSIUM 50 MG PO TABS
100.0000 mg | ORAL_TABLET | Freq: Every day | ORAL | Status: DC
  Administered 2013-09-22 – 2013-09-24 (×3): 100 mg via ORAL
  Filled 2013-09-21 (×4): qty 2

## 2013-09-21 MED ORDER — MORPHINE SULFATE 4 MG/ML IJ SOLN
4.0000 mg | Freq: Once | INTRAMUSCULAR | Status: AC
  Administered 2013-09-21: 4 mg via INTRAVENOUS
  Filled 2013-09-21: qty 1

## 2013-09-21 MED ORDER — ALBUTEROL SULFATE (5 MG/ML) 0.5% IN NEBU
2.5000 mg | INHALATION_SOLUTION | Freq: Once | RESPIRATORY_TRACT | Status: AC
  Administered 2013-09-21: 2.5 mg via RESPIRATORY_TRACT

## 2013-09-21 SURGICAL SUPPLY — 24 items
BAG URO CATCHER STRL LF (DRAPE) ×2 IMPLANT
BASKET ZERO TIP NITINOL 2.4FR (BASKET) IMPLANT
CATH INTERMIT  6FR 70CM (CATHETERS) ×4 IMPLANT
CLOTH BEACON ORANGE TIMEOUT ST (SAFETY) ×2 IMPLANT
DRAPE CAMERA CLOSED 9X96 (DRAPES) ×2 IMPLANT
GLOVE BIOGEL M STRL SZ7.5 (GLOVE) ×2 IMPLANT
GLOVE SURG SS PI 8.5 STRL IVOR (GLOVE) ×2
GLOVE SURG SS PI 8.5 STRL STRW (GLOVE) ×2 IMPLANT
GOWN PREVENTION PLUS LG XLONG (DISPOSABLE) ×2 IMPLANT
GOWN PREVENTION PLUS XLARGE (GOWN DISPOSABLE) IMPLANT
GOWN STRL REIN 2XL XLG LVL4 (GOWN DISPOSABLE) ×2 IMPLANT
GUIDEWIRE ANG ZIPWIRE 038X150 (WIRE) IMPLANT
GUIDEWIRE STR DUAL SENSOR (WIRE) ×2 IMPLANT
HOLDER FOLEY CATH W/STRAP (MISCELLANEOUS) ×2 IMPLANT
HOVERMATT HALF SINGLE USE (PATIENT TRANSFER) ×2 IMPLANT
IV NS 1000ML (IV SOLUTION)
IV NS 1000ML BAXH (IV SOLUTION) IMPLANT
MANIFOLD NEPTUNE II (INSTRUMENTS) ×2 IMPLANT
NS IRRIG 1000ML POUR BTL (IV SOLUTION) ×2 IMPLANT
PACK CYSTO (CUSTOM PROCEDURE TRAY) ×2 IMPLANT
STENT CONTOUR 6FRX24X.038 (STENTS) ×2 IMPLANT
TRAY FOLEY CATH 16FRSI W/METER (SET/KITS/TRAYS/PACK) ×2 IMPLANT
TUBING CONNECTING 10 (TUBING) ×2 IMPLANT
WATER STERILE IRR 3000ML UROMA (IV SOLUTION) ×2 IMPLANT

## 2013-09-21 NOTE — ED Notes (Signed)
[facility] arrived to transport patient. All belongings sent with [facility] (shirt and pink sandals). Pt's son took her purse home when he left prior.

## 2013-09-21 NOTE — Progress Notes (Signed)
Utilization review completed.  

## 2013-09-21 NOTE — ED Notes (Signed)
Paged admitting MD notified of patient tachycardia of 115-120.

## 2013-09-21 NOTE — ED Notes (Signed)
Waiting for [facility] to arrive.

## 2013-09-21 NOTE — ED Notes (Signed)
Patient was awakened by right sided flank pain about 0130 this AM.  +emesis and difficulty with urination

## 2013-09-21 NOTE — Anesthesia Preprocedure Evaluation (Addendum)
Anesthesia Evaluation   Patient awake    History of Anesthesia Complications Negative for: history of anesthetic complications  Airway Mallampati: III TM Distance: >3 FB Neck ROM: Limited    Dental  (+) Teeth Intact, Chipped and Dental Advisory Given   Pulmonary shortness of breath and with exertion, asthma , pneumonia -, COPD oxygen dependent,  breath sounds clear to auscultation  Pulmonary exam normal       Cardiovascular hypertension, + CAD, + Past MI and + Cardiac Stents + Valvular Problems/Murmurs Rhythm:Regular Rate:Normal  Low risk Lexiscan with EF 65%   Neuro/Psych  Headaches, Depression    GI/Hepatic GERD-  ,  Endo/Other  diabetes, Poorly Controlled, Type 2, Oral Hypoglycemic AgentsMorbid obesity  Renal/GU Renal disease     Musculoskeletal negative musculoskeletal ROS (+)   Abdominal (+) + obese,   Peds  Hematology negative hematology ROS (+)   Anesthesia Other Findings   Reproductive/Obstetrics                          Anesthesia Physical Anesthesia Plan  ASA: III and emergent  Anesthesia Plan: General   Post-op Pain Management:    Induction: Intravenous  Airway Management Planned: Oral ETT  Additional Equipment:   Intra-op Plan:   Post-operative Plan: Extubation in OR  Informed Consent: I have reviewed the patients History and Physical, chart, labs and discussed the procedure including the risks, benefits and alternatives for the proposed anesthesia with the patient or authorized representative who has indicated his/her understanding and acceptance.   Dental advisory given  Plan Discussed with: CRNA  Anesthesia Plan Comments:         Anesthesia Quick Evaluation

## 2013-09-21 NOTE — Consult Note (Signed)
Urology Consult  Referring physician:   Dr. Elwyn Reach Reason for referral:  Continued kidney stone pain  Chief Complaint:  Abdominal pain  History of Present Illness:   52 yo obese diabetic female, awoke at 1:15 AM with severe Right flank pain radiating to the RLQ, with associated nausea and vomiting, and hx of kidney stone several years ago. ( unknown Urologist) She was seen in [facility] ED, with CT showing obstructing Right renal stone, and remains uncomfortable despite IV narcotic therapy.No gross hematuria. No fever or chills.   Note her pmh:  Asthma    .  Hypertension    .  Hyperlipidemia    .  GERD (gastroesophageal reflux disease)    .  Depression    .  Aortic stenosis, moderate      per 11/2011 echo   .  Blood transfusion      2013[facility]   .  Headache(784.0)      h/o migraines   .  Pulmonary embolism  02/08/12     S/P RT TOTAL KNEE ON 02/03/12--ON 02/08/12--DEVELOPED ACUTE SOB AND CHEST PAIN--AND DIAGNOSED WITH PULMONARY EMBOLUS AND PNEUMONIA   .  Restless leg syndrome    .  Myocardial infarction      PT THINKS SHE WAS DX WITH MI AT THE TIME OF HEART STENTING   .  CAD (coronary artery disease)      Cath 2010 with DES x 1 RCA-- PT'S CARDIOLOGIST IS DR. MCALHANY   .  Uterine fibroid      NO PROBLEMS AT PRESENT FROM THE FIBROIDS-STATES SHE IS POST MENOPAUSAL-LAST MENSES 2010 EXCEPT FOR EPISODE THIS YR OF BLEEDING RELATED TO FIBROIDS.   Marland Kitchen  Heart murmur    .  Pneumonia  02/08/12     H[facility]PITALIZED AT [facility] WITH PNEUMONIA AND WITH PULMONARY EMBOLUS   .  Diabetes mellitus  DIAGNOSED IN2010     ORAL MEDS   .  Arthritis      PAIN AND SEVERE OA LEFT KNEE ; S/P RIGHT TKA ON 02/03/12; HAS LOWER BACK PAIN-UNABLE TO STAND MORE THAN 10 MIN; ARTHRITIS "ALL OVER"   .  Neck pain    .  Weakness      BOTH HANDS - S/P BILATERAL CARPAL TUNNEL RELEASE--BUT STILL HAS WEAKNESS--OFTEN DROPS THINGS   .  Shortness of breath      with exertion   .  Eczema      on back   .  Kidney stones    .  Anemia     Past  Surgical History   Procedure  Laterality  Date   .  Tubal ligation     .  Carpal tunnel release       Bilateral   .  Cholecystectomy     .  Hernia repair     .  Cardiac catheterization     .  Knee arthroplasty   02/03/2012     Procedure: COMPUTER ASSISTED TOTAL KNEE ARTHROPLASTY; Surgeon: [name], MD; Location: [facility] OR; Service: Orthopedics; Laterality: Right; Right total knee arthroplasty   .  Coronary angioplasty       2010 has stent in place   .  Total knee arthroplasty   09/10/2012     Procedure: TOTAL KNEE ARTHROPLASTY; Surgeon: [name], MD; Location: [facility] ORS; Service: Orthopedics; Laterality: Left;   .  Trigger finger release   09/10/2012     Procedure: RELEASE TRIGGER FINGER/A-1 PULLEY; Surgeon: [name], MD; Location: [facility] ORS;  Service: Orthopedics; Laterality: Right; Right Ring Finger   .  Tee without cardioversion  N/A  03/14/2013     Procedure: TRANSESOPHAGEAL ECHOCARDIOGRAM (TEE); Surgeon: Lewayne Bunting, MD; Location: [facility] ENDOSCOPY; Service: Cardiovascular; Laterality: N/A;   .  Joint replacement       bil total knees   .  Total knee revision        Past Medical History  Diagnosis Date  . Asthma   . Hypertension   . Hyperlipidemia   . GERD (gastroesophageal reflux disease)   . Depression   . Aortic stenosis, moderate     per 11/2011 echo  . Blood transfusion     2013[facility]  . Headache(784.0)     h/o migraines  . Pulmonary embolism 02/08/12    S/P RT TOTAL KNEE ON 02/03/12--ON 02/08/12--DEVELOPED ACUTE SOB AND CHEST PAIN--AND DIAGNOSED WITH  PULMONARY EMBOLUS AND PNEUMONIA  . Restless leg syndrome   . Myocardial infarction     PT THINKS SHE WAS DX WITH MI AT THE TIME OF HEART STENTING  . CAD (coronary artery disease)     Cath 2010 with DES x 1 RCA-- PT'S CARDIOLOGIST IS DR. MCALHANY  . Uterine fibroid     NO PROBLEMS AT PRESENT FROM THE FIBROIDS-STATES SHE IS POST MENOPAUSAL-LAST MENSES 2010 EXCEPT FOR EPISODE THIS YR OF BLEEDING  RELATED TO FIBROIDS.  Marland Kitchen Heart murmur   . Pneumonia 02/08/12    HOSPITALIZED AT [facility] WITH PNEUMONIA AND WITH PULMONARY EMBOLUS  . Diabetes mellitus DIAGNOSED IN2010    ORAL MEDS  . Arthritis     PAIN AND SEVERE OA LEFT KNEE ; S/P RIGHT TKA ON 02/03/12; HAS LOWER BACK PAIN-UNABLE TO STAND MORE THAN 10 MIN; ARTHRITIS "ALL OVER"  . Neck pain   . Weakness     BOTH HANDS - S/P BILATERAL CARPAL TUNNEL RELEASE--BUT STILL HAS WEAKNESS--OFTEN DROPS THINGS  . Shortness of breath     with exertion  . Eczema     on back  . Kidney stones   . Anemia   . COPD (chronic obstructive pulmonary disease)    Past Surgical History  Procedure Laterality Date  . Tubal ligation    . Carpal tunnel release      Bilateral  . Cholecystectomy    . Hernia repair    . Cardiac catheterization    . Knee arthroplasty  02/03/2012    Procedure: COMPUTER ASSISTED TOTAL KNEE ARTHROPLASTY;  Surgeon: [name], MD;  Location: [facility] OR;  Service: Orthopedics;  Laterality: Right;  Right total knee arthroplasty  . Coronary angioplasty      2010 has stent in place  . Total knee arthroplasty  09/10/2012    Procedure: TOTAL KNEE ARTHROPLASTY;  Surgeon: [name], MD;  Location: [facility] ORS;  Service: Orthopedics;  Laterality: Left;  . Trigger finger release  09/10/2012    Procedure: RELEASE TRIGGER FINGER/A-1 PULLEY;  Surgeon: [name], MD;  Location: [facility] ORS;  Service: Orthopedics;  Laterality: Right;  Right Ring Finger  . Tee without cardioversion N/A 03/14/2013    Procedure: TRANSESOPHAGEAL ECHOCARDIOGRAM (TEE);  Surgeon: Lewayne Bunting, MD;  Location: [facility] ENDOSCOPY;  Service: Cardiovascular;  Laterality: N/A;  . Joint replacement      bil total knees  . Total knee revision Right 07/15/2013    Procedure: REVISION ARTHROPLASTY RIGHT KNEE;  Surgeon: [name], MD;  Location: [facility] ORS;  Service: Orthopedics;  Laterality: Right;    Medications: I have reviewed the  patient's current  medications. Allergies: No Known Allergies  Family History  Problem Relation Age of Onset  . Breast cancer Mother   . COPD Father     smoked  . Cancer Brother     Sinus  . Emphysema Mother     smoked  . Asthma Father   . Heart disease Mother   . Heart disease Father    Social History:  reports that she quit smoking about 12 years ago. Her smoking use included Cigarettes. She has a 45 pack-year smoking history. She does not have any smokeless tobacco history on file. She reports that she does not drink alcohol or use illicit drugs.  ROS: All systems are reviewed and negative except as noted. Nausea and vomiting. No gross hematuria. No fever or chills.   Physical Exam:  Vital signs in last 24 hours: Temp:  [97.6 F (36.4 C)] 97.6 F (36.4 C) (09/24 0400) Pulse Rate:  [76-88] 88 (09/24 0700) Resp:  [20] 20 (09/24 0400) BP: (145-157)/(70-74) 157/74 mmHg (09/24 0700) SpO2:  [95 %-98 %] 98 % (09/24 0700) Weight:  [145.605 kg (321 lb)] 145.605 kg (321 lb) (09/24 0400)  Cardiovascular: Skin warm; not flushed Respiratory: Breaths quiet; no shortness of breath Abdomen: No masses. Morbid obesity. ? Myxoid faisces.  Neurological: Normal sensation to touch Musculoskeletal: Normal motor function arms and legs Lymphatics: No inguinal adenopathy Skin: No rashes Genitourinary: Normal BUS  Laboratory Data:  Results for orders placed during the hospital encounter of 09/21/13 (from the past 72 hour(s))  CBC WITH DIFFERENTIAL     Status: None   Collection Time    09/21/13  3:30 AM      Result Value Range   WBC 10.1  4.0 - 10.5 K/uL   RBC 4.48  3.87 - 5.11 MIL/uL   Hemoglobin 12.4  12.0 - 15.0 g/dL   HCT 57.8  46.9 - 62.9 %   MCV 83.0  78.0 - 100.0 fL   MCH 27.7  26.0 - 34.0 pg   MCHC 33.3  30.0 - 36.0 g/dL   RDW 52.8  41.3 - 24.4 %   Platelets 213  150 - 400 K/uL   Neutrophils Relative % 69  43 - 77 %   Neutro Abs 6.9  1.7 - 7.7 K/uL   Lymphocytes Relative 22  12 - 46 %   Lymphs  Abs 2.2  0.7 - 4.0 K/uL   Monocytes Relative 6  3 - 12 %   Monocytes Absolute 0.6  0.1 - 1.0 K/uL   Eosinophils Relative 3  0 - 5 %   Eosinophils Absolute 0.3  0.0 - 0.7 K/uL   Basophils Relative 0  0 - 1 %   Basophils Absolute 0.0  0.0 - 0.1 K/uL  COMPREHENSIVE METABOLIC PANEL     Status: Abnormal   Collection Time    09/21/13  3:30 AM      Result Value Range   Sodium 142  135 - 145 mEq/L   Potassium 4.2  3.5 - 5.1 mEq/L   Chloride 102  96 - 112 mEq/L   CO2 28  19 - 32 mEq/L   Glucose, Bld 194 (*) 70 - 99 mg/dL   BUN 16  6 - 23 mg/dL   Creatinine, Ser 0.10  0.50 - 1.10 mg/dL   Calcium 8.8  8.4 - 27.2 mg/dL   Total Protein 6.8  6.0 - 8.3 g/dL   Albumin 3.5  3.5 - 5.2 g/dL   AST 19  0 - 37 U/L   ALT 13  0 - 35 U/L   Alkaline Phosphatase 80  39 - 117 U/L   Total Bilirubin 0.6  0.3 - 1.2 mg/dL   GFR calc non Af Amer 81 (*) >90 mL/min   GFR calc Af Amer >90  >90 mL/min   Comment: (NOTE)     The eGFR has been calculated using the CKD EPI equation.     This calculation has not been validated in all clinical situations.     eGFR's persistently <90 mL/min signify possible Chronic Kidney     Disease.  LIPASE, BLOOD     Status: None   Collection Time    09/21/13  3:30 AM      Result Value Range   Lipase 29  11 - 59 U/L  GLUCOSE, CAPILLARY     Status: Abnormal   Collection Time    09/21/13  3:55 AM      Result Value Range   Glucose-Capillary 190 (*) 70 - 99 mg/dL   Comment 1 Documented in Chart     Comment 2 Notify RN    URINALYSIS, ROUTINE W REFLEX MICROSCOPIC     Status: Abnormal   Collection Time    09/21/13  6:09 AM      Result Value Range   Color, Urine AMBER (*) YELLOW   Comment: BIOCHEMICALS MAY BE AFFECTED BY COLOR   APPearance CLOUDY (*) CLEAR   Specific Gravity, Urine 1.028  1.005 - 1.030   pH 5.0  5.0 - 8.0   Glucose, UA 100 (*) NEGATIVE mg/dL   Hgb urine dipstick LARGE (*) NEGATIVE   Bilirubin Urine NEGATIVE  NEGATIVE   Ketones, ur NEGATIVE  NEGATIVE mg/dL    Protein, ur 478 (*) NEGATIVE mg/dL   Urobilinogen, UA 0.2  0.0 - 1.0 mg/dL   Nitrite POSITIVE (*) NEGATIVE   Leukocytes, UA SMALL (*) NEGATIVE  URINE MICROSCOPIC-ADD ON     Status: Abnormal   Collection Time    09/21/13  6:09 AM      Result Value Range   Squamous Epithelial / LPF FEW (*) RARE   WBC, UA 21-50  <3 WBC/hpf   RBC / HPF 21-50  <3 RBC/hpf   Bacteria, UA MANY (*) RARE   Urine-Other MUCOUS PRESENT     No results found for this or any previous visit (from the past 240 hour(s)). Creatinine:  Recent Labs  09/21/13 0330  CREATININE 0.81    Xrays:  CLINICAL DATA: Right flank pain.  EXAM:  CT ABDOMEN AND PELVIS WITHOUT CONTRAST  TECHNIQUE:  Multidetector CT imaging of the abdomen and pelvis was performed  following the standard protocol without intravenous contrast.  COMPARISON: 02/15/2012  FINDINGS:  BODY WALL: Status post ventral abdominal wall hernia repair using  mesh. As noted previously, there is fatty herniation at the upper  and lower margins of the mesh.  LOWER CHEST:  Mediastinum: Coronary artery atherosclerosis and probable mild  cardiomegaly.  ABDOMEN/PELVIS:  Liver: Diffuse fatty infiltration.  Biliary: Cholecystectomy.  Pancreas: Unremarkable.  Spleen: Unremarkable.  Adrenals: Unremarkable.  Kidneys and ureters: 9 mm stone at the right ureteropelvic junction  with proximal hydronephrosis, ipsilateral renomegaly, and  ipsilateral perinephric edema. There is a punctate stone associated  with the lower pole left kidney, nonobstructive.  Bladder: Unremarkable.  Bowel: No obstruction.  Retroperitoneum: 2 cm long nodule or lymph node, left periaortic at  the level of the upper pole left kidney, unchanged from prior.  Peritoneum:  No free fluid or gas.  Reproductive: Unremarkable.  Vascular: No acute abnormality.  OSSEOUS: No acute abnormalities. No suspicious lytic or blastic  lesions.  IMPRESSION:  1. 9 mm right ureteropelvic junction calculus with  obstructive  uropathy.  2. Ventral abdominal wall hernia repair using mesh. There is chronic  fat herniation superior and inferior to the repair.  Electronically Signed  By: Tiburcio Pea  On: 09/21/2013 05:01   Impression/Assessment:    9mm R UPJ stone. Will need xfer to [facility], and admission to [facility], and stabilization, then to OR for Right JJ stent.   Plan:  1. NPO x ice chips 2. xfer to [facility], [facility] ( pvt pt Dr. Larey Dresser) 3. Labs: include PT, PTT, INR, thyroid, A1c, EKG, CXR, etc. 4. Permit for cystoscopy and right retrograde pyelogram and right JJ stent.   Brandilynn Taormina I 09/21/2013, 8:08 AM

## 2013-09-21 NOTE — ED Notes (Signed)
Urology in ED seeing patient. Consulting with hospitalist to get patient transferred to [facility]

## 2013-09-21 NOTE — Progress Notes (Signed)
Spoke with patient in regards to nighttime CPAP ordered PRN-- pt states she wears 02 nocturnally at home/ doesn't wear CPAP. Vitals currently stable on 50% Venturi Mask, pt comfortable. RT will assist as needed. RN made aware

## 2013-09-21 NOTE — Progress Notes (Signed)
Patient ID: [name], female   DOB: Nov 17, 1961, 52 y.o.   MRN: [id number]  Pt continues to have right flank pain, fever. She has an elevated WBC count and dirty UA. I reviewed chart, labs, CT images and pt seen and examined by me. She actually appears to have a large right UPJ stone and a smaller stone distal in the proximal ureter. I discussed with the patient the nature, potential benefits, risks and alternatives to cystiscioy, right RGP, right ureteral stent, including side effects of the proposed treatment, the likelihood of the patient achieving the goals of the procedure, and any potential problems that might occur during the procedure or recuperation. All questions answered. Patient elects to proceed. Patient gave me a # to update family -  I discussed with her son [name] who will discuss with her husband [name].

## 2013-09-21 NOTE — ED Provider Notes (Signed)
8:00 AM  Assumed care from Dr. Blinda Leatherwood.  Patient is a 52 year-old morbidly obese female with a history of hypertension, hyperlipidemia, aortic stenosis, pulmonary embolus, CAD s/p stent and myocardial infarction on Plavix anda ASA, diabetes, kidney stones who presents emergency department for evaluation for right-sided flank pain, nausea and vomiting. On CT scan, patient is a 9 mm right UPJ stone with mild hydronephrosis. Her urine also shows infection. Her urine culture is pending. She is hemodynamically stable. She's been given ceftriaxone. Dr. Jethro Bolus with urology has seen the patient and feel she will need a stent placed today. Given her multiple comorbidities, he would like for her to be admitted to the hospitalist service at [facility].   8:36 AM  Spoke with Dr. York Spaniel, hospitalist for admission.  [name] Maw Aydee Mcnew, DO 09/21/13 [phone]

## 2013-09-21 NOTE — ED Provider Notes (Signed)
CSN: [id number]     Arrival date & time 09/21/13  0320 History   First MD Initiated Contact with Patient 09/21/13 [phone]     Chief Complaint  Patient presents with  . Emesis   (Consider location/radiation/quality/duration/timing/severity/associated sxs/prior Treatment) HPI Comments: Patient reports that she was awakened from sleep 45 minutes ago with onset of right flank pain with nausea and vomiting. Patient has not had any fever and she denies diarrhea. Pain is moderate to severe. She has vomited multiple times.  [name] presenting with vomiting.  Emesis Associated symptoms: abdominal pain   Associated symptoms: no diarrhea     Past Medical History  Diagnosis Date  . Asthma   . Hypertension   . Hyperlipidemia   . GERD (gastroesophageal reflux disease)   . Depression   . Aortic stenosis, moderate     per 11/2011 echo  . Blood transfusion     2013[facility]  . Headache(784.0)     h/o migraines  . Pulmonary embolism 02/08/12    S/P RT TOTAL KNEE ON 02/03/12--ON 02/08/12--DEVELOPED ACUTE SOB AND CHEST PAIN--AND DIAGNOSED WITH  PULMONARY EMBOLUS AND PNEUMONIA  . Restless leg syndrome   . Myocardial infarction     PT THINKS SHE WAS DX WITH MI AT THE TIME OF HEART STENTING  . CAD (coronary artery disease)     Cath 2010 with DES x 1 RCA-- PT'S CARDIOLOGIST IS DR. MCALHANY  . Uterine fibroid     NO PROBLEMS AT PRESENT FROM THE FIBROIDS-STATES SHE IS POST MENOPAUSAL-LAST MENSES 2010 EXCEPT FOR EPISODE THIS YR OF BLEEDING RELATED TO FIBROIDS.  Marland Kitchen Heart murmur   . Pneumonia 02/08/12    HOSPITALIZED AT [facility] WITH PNEUMONIA AND WITH PULMONARY EMBOLUS  . Diabetes mellitus DIAGNOSED IN2010    ORAL MEDS  . Arthritis     PAIN AND SEVERE OA LEFT KNEE ; S/P RIGHT TKA ON 02/03/12; HAS LOWER BACK PAIN-UNABLE TO STAND MORE THAN 10 MIN; ARTHRITIS "ALL OVER"  . Neck pain   . Weakness     BOTH HANDS - S/P BILATERAL CARPAL TUNNEL RELEASE--BUT STILL HAS WEAKNESS--OFTEN DROPS THINGS  .  Shortness of breath     with exertion  . Eczema     on back  . Kidney stones   . Anemia    Past Surgical History  Procedure Laterality Date  . Tubal ligation    . Carpal tunnel release      Bilateral  . Cholecystectomy    . Hernia repair    . Cardiac catheterization    . Knee arthroplasty  02/03/2012    Procedure: COMPUTER ASSISTED TOTAL KNEE ARTHROPLASTY;  Surgeon: [name], MD;  Location: [facility] OR;  Service: Orthopedics;  Laterality: Right;  Right total knee arthroplasty  . Coronary angioplasty      2010 has stent in place  . Total knee arthroplasty  09/10/2012    Procedure: TOTAL KNEE ARTHROPLASTY;  Surgeon: [name], MD;  Location: [facility] ORS;  Service: Orthopedics;  Laterality: Left;  . Trigger finger release  09/10/2012    Procedure: RELEASE TRIGGER FINGER/A-1 PULLEY;  Surgeon: [name], MD;  Location: [facility] ORS;  Service: Orthopedics;  Laterality: Right;  Right Ring Finger  . Tee without cardioversion N/A 03/14/2013    Procedure: TRANSESOPHAGEAL ECHOCARDIOGRAM (TEE);  Surgeon: Lewayne Bunting, MD;  Location[facility] ENDOSCOPY;  Service: Cardiovascular;  Laterality: N/A;  . Joint replacement      bil total knees  .  Total knee revision Right 07/15/2013    Procedure: REVISION ARTHROPLASTY RIGHT KNEE;  Surgeon: [name], MD;  Location: [facility] ORS;  Service: Orthopedics;  Laterality: Right;   Family History  Problem Relation Age of Onset  . Breast cancer Mother   . COPD Father     smoked  . Cancer Brother     Sinus  . Emphysema Mother     smoked  . Asthma Father   . Heart disease Mother   . Heart disease Father    History  Substance Use Topics  . Smoking status: Former Smoker -- 1.50 packs/day for 30 years    Types: Cigarettes    Quit date: 12/29/2000  . Smokeless tobacco: Not on file  . Alcohol Use: No   OB History   Grav Para Term Preterm Abortions TAB SAB Ect Mult Living                 Review of Systems  Respiratory:  Negative.   Cardiovascular: Negative.   Gastrointestinal: Positive for nausea, vomiting and abdominal pain. Negative for diarrhea.  Genitourinary: Positive for flank pain.  All other systems reviewed and are negative.    Allergies  Review of patient's allergies indicates no known allergies.  Home Medications   Current Outpatient Rx  Name  Route  Sig  Dispense  Refill  . albuterol (PROVENTIL HFA;VENTOLIN HFA) 108 (90 BASE) MCG/ACT inhaler   Inhalation   Inhale 2 puffs into the lungs every 4 (four) hours as needed for wheezing or shortness of breath (((PLAN B))).          Marland Kitchen albuterol (PROVENTIL) (2.5 MG/3ML) 0.083% nebulizer solution   Nebulization   Take 2.5 mg by nebulization every 4 (four) hours as needed for wheezing or shortness of breath (((PLAN C))).          Marland Kitchen aspirin EC 325 MG tablet   Oral   Take 1 tablet (325 mg total) by mouth 2 (two) times daily.   50 tablet   0   . atorvastatin (LIPITOR) 80 MG tablet   Oral   Take 80 mg by mouth at bedtime.         . budesonide-formoterol (SYMBICORT) 160-4.5 MCG/ACT inhaler   Inhalation   Inhale 2 puffs into the lungs 2 (two) times daily.         . clopidogrel (PLAVIX) 75 MG tablet   Oral   Take 75 mg by mouth every morning.          . cyclobenzaprine (FLEXERIL) 10 MG tablet   Oral   Take 10 mg by mouth every 8 (eight) hours as needed for muscle spasms.          Marland Kitchen escitalopram (LEXAPRO) 20 MG tablet   Oral   Take 20 mg by mouth every morning.          . ezetimibe (ZETIA) 10 MG tablet   Oral   Take 1 tablet (10 mg total) by mouth every evening.   24 tablet   0   . famotidine (PEPCID) 20 MG tablet   Oral   Take 20 mg by mouth at bedtime.         . ferrous sulfate 325 (65 FE) MG tablet   Oral   Take 1 tablet (325 mg total) by mouth 3 (three) times daily after meals.   90 tablet   0   . furosemide (LASIX) 40 MG tablet   Oral   Take 40 mg by  mouth 2 (two) times daily.         Marland Kitchen gabapentin  (NEURONTIN) 300 MG capsule   Oral   Take 300 mg by mouth 2 (two) times daily.          Marland Kitchen glipiZIDE (GLUCOTROL) 5 MG tablet      1/2 tab with morning meal, 1 whole tab with evening meal         . hydrochlorothiazide (HYDRODIURIL) 25 MG tablet   Oral   Take 25 mg by mouth every morning.          Marland Kitchen losartan (COZAAR) 100 MG tablet   Oral   Take 100 mg by mouth daily with breakfast.          . Multiple Vitamin (MULTIVITAMIN) tablet   Oral   Take 1 tablet by mouth daily.         . Nebulizers (COMPRESSOR/NEBULIZER) MISC      As directed   1 each   0     Dx 493.90   . omeprazole (PRILOSEC) 20 MG capsule   Oral   Take 20 mg by mouth daily before breakfast.         . oxyCODONE-acetaminophen (ROXICET) 5-325 MG per tablet   Oral   Take 1-2 tablets by mouth every 4 (four) hours as needed for pain.   60 tablet   0   . potassium chloride SA (K-DUR,KLOR-CON) 20 MEQ tablet   Oral   Take 40 mEq by mouth daily after breakfast.           LMP 07/26/2009 Physical Exam  Constitutional: She is oriented to person, place, and time. She appears well-developed and well-nourished. No distress.  HENT:  Head: Normocephalic and atraumatic.  Right Ear: Hearing normal.  Left Ear: Hearing normal.  Nose: Nose normal.  Mouth/Throat: Oropharynx is clear and moist and mucous membranes are normal.  Eyes: Conjunctivae and EOM are normal. Pupils are equal, round, and reactive to light.  Neck: Normal range of motion. Neck supple.  Cardiovascular: Regular rhythm, S1 normal and S2 normal.  Exam reveals no gallop and no friction rub.   No murmur heard. Pulmonary/Chest: Effort normal and breath sounds normal. No respiratory distress. She exhibits no tenderness.  Abdominal: Soft. Normal appearance and bowel sounds are normal. There is no hepatosplenomegaly. There is tenderness in the right upper quadrant, right lower quadrant and suprapubic area. There is no rebound, no guarding, no tenderness  at McBurney's point and negative Murphy's sign. No hernia.  Musculoskeletal: Normal range of motion.  Neurological: She is alert and oriented to person, place, and time. She has normal strength. No cranial nerve deficit or sensory deficit. Coordination normal. GCS eye subscore is 4. GCS verbal subscore is 5. GCS motor subscore is 6.  Skin: Skin is warm, dry and intact. No rash noted. No cyanosis.  Psychiatric: She has a normal mood and affect. Her speech is normal and behavior is normal. Thought content normal.    ED Course  Procedures (including critical care time) Labs Review Labs Reviewed  COMPREHENSIVE METABOLIC PANEL - Abnormal; Notable for the following:    Glucose, Bld 194 (*)    GFR calc non Af Amer 81 (*)    All other components within normal limits  URINALYSIS, ROUTINE W REFLEX MICROSCOPIC - Abnormal; Notable for the following:    Color, Urine AMBER (*)    APPearance CLOUDY (*)    Glucose, UA 100 (*)    Hgb urine dipstick LARGE (*)  Protein, ur 100 (*)    Nitrite POSITIVE (*)    Leukocytes, UA SMALL (*)    All other components within normal limits  GLUCOSE, CAPILLARY - Abnormal; Notable for the following:    Glucose-Capillary 190 (*)    All other components within normal limits  URINE MICROSCOPIC-ADD ON - Abnormal; Notable for the following:    Squamous Epithelial / LPF FEW (*)    Bacteria, UA MANY (*)    All other components within normal limits  CBC WITH DIFFERENTIAL  LIPASE, BLOOD   Imaging Review Ct Abdomen Pelvis Wo Contrast  09/21/2013   CLINICAL DATA:  Right flank pain.  EXAM: CT ABDOMEN AND PELVIS WITHOUT CONTRAST  TECHNIQUE: Multidetector CT imaging of the abdomen and pelvis was performed following the standard protocol without intravenous contrast.  COMPARISON:  02/15/2012  FINDINGS: BODY WALL: Status post ventral abdominal wall hernia repair using mesh. As noted previously, there is fatty herniation at the upper and lower margins of the mesh.  LOWER CHEST:   Mediastinum: Coronary artery atherosclerosis and probable mild cardiomegaly.  ABDOMEN/PELVIS:  Liver: Diffuse fatty infiltration.  Biliary: Cholecystectomy.  Pancreas: Unremarkable.  Spleen: Unremarkable.  Adrenals: Unremarkable.  Kidneys and ureters: 9 mm stone at the right ureteropelvic junction with proximal hydronephrosis, ipsilateral renomegaly, and ipsilateral perinephric edema. There is a punctate stone associated with the lower pole left kidney, nonobstructive.  Bladder: Unremarkable.  Bowel: No obstruction.  Retroperitoneum: 2 cm long nodule or lymph node, left periaortic at the level of the upper pole left kidney, unchanged from prior.  Peritoneum: No free fluid or gas.  Reproductive: Unremarkable.  Vascular: No acute abnormality.  OSSEOUS: No acute abnormalities. No suspicious lytic or blastic lesions.  IMPRESSION: 1. 9 mm right ureteropelvic junction calculus with obstructive uropathy. 2. Ventral abdominal wall hernia repair using mesh. There is chronic fat herniation superior and inferior to the repair.   Electronically Signed   By: Tiburcio Pea   On: 09/21/2013 05:01    MDM  Diagnosis: 1. Renal colic 2. UTI  Patient presents to the ER for evaluation of severe right-sided flank and abdominal pain. Patient does report a previous history of kidney stones. CT scan was performed it does confirm a large stone at the ureteropelvic junction. Multiple doses of narcotic analgesia has not improved the patient's pain. Case discussed with Doctor Patsi Sears, will see the patient in the ER.  Addendum: Since talking with Doctor Patsi Sears, urinalysis has returned. She does have positive nitrite, positive esterase, large amount of white cells concerning for infection with this stone. Patient administered Rocephin.  Gilda Crease, MD 09/21/13 [phone]

## 2013-09-21 NOTE — Progress Notes (Signed)
Preparing patient for surgery and found that she had two rings on her left hand.  Rings were placed in security folder and signed and sealed by patient since there wasn't any family at the bedside to take them home.  Taken to security until after surgery.

## 2013-09-21 NOTE — ED Notes (Signed)
Pt son, Jorja Loa. Phone Number [phone].

## 2013-09-21 NOTE — ED Notes (Signed)
Spoke with EDP, reporting [facility] hospitalist will come see patient and will assist with transfer to [facility].

## 2013-09-21 NOTE — Op Note (Signed)
Preoperative diagnosis: Right ureteral stone, hydronephrosis, fever, elevated white count, UTI Postop diagnosis: Same  Procedure: Cystoscopy Right retrograde pyelogram Right ureteral stent placement  Surgeon: Mena Goes  Anesthesia: Fortune  Type of anesthesia: Gen.  Findings: On cystoscopy the bladder appeared normal without stone or foreign body. The trigone and ureteral orifice these were orthotopic. The bladder mucosa appeared normal without any obvious tumor.  Right retrograde pyelogram this outlined a normal ureter with filling defect in the proximal ureter in the region of the ureteropelvic junction with a dilated renal pelvis and collecting system.  Description of procedure: After consent was obtained patient brought to the operating room. After adequate anesthesia she was placed in lithotomy position and prepped and draped in the usual sterile fashion. A timeout was performed to confirm the patient and procedure. A cystoscope was passed per urethra and the right ureteral orifice cannulated with a 6 [place] open-ended catheter and a right retrograde pyelogram obtained by injecting contrast retrograde. A sensor wire was then advanced and coiled in the collecting system. Initially just 1 pole of the kidney filled out. I did not see a another ureteral orifice to indicate a duplicate system. After advancing the sensor wire copious purulent urine came pouring out of the ureteral orifice. I advanced a 6 French catheter into the region of the renal pelvis and removed the wire and repeated the retrograde to confirm that there was not a duplicated system and this outlined now a dilated upper middle and lower calyces consistent with a single ureter single collecting system unit. A sensor wire was readvanced and coiled in the upper pole. The 6 [place] open-ended catheter removed and a 6 x 24 cm stent was advanced over the wire. The wire was removed and a good coil seen in the kidney and a good coil in  the bladder. The scope was removed and a 16 [place] Foley was placed to maximally drain the system overnight. Patient was awakened taken to recovery room in stable condition.  Complications: None Blood loss: Minimal Specimens: None Drains: 6 x 24 cm right ureteral stent without string  Disposition: Patient stable to PACU.

## 2013-09-21 NOTE — Transfer of Care (Signed)
Immediate Anesthesia Transfer of Care Note  Patient: [name]  Procedure(s) Performed: Procedure(s) (LRB): CYSTOSCOPY WITH RIGHT RETROGRADE PYELOGRAM RIGHT DOUBLE J STENT  (Right)  Patient Location: PACU  Anesthesia Type: General  Level of Consciousness: sedated, patient cooperative and responds to stimulation  Airway & Oxygen Therapy: Patient Spontanous Breathing and Patient connected to face mask oxgen  Post-op Assessment: Report given to PACU RN and Post -op Vital signs reviewed and stable  Post vital signs: Reviewed and stable  Complications: No apparent anesthesia complications

## 2013-09-21 NOTE — Progress Notes (Signed)
MD paged regarding patient's preop VS and temp of 101.9. No new orders received at this time.

## 2013-09-21 NOTE — H&P (Addendum)
[facility] History and Physical  [name] ZOX:[id number] DOB: 1961/12/27 DOA: 09/21/2013  Referring physician:  PCP: Ron Parker, MD  Specialists: urology   Chief Complaint: R sided back pain   HPI: [name] is a 52 y.o. female CAD (s/p PTCA stent RCA -2010 Cardiac cath on 03/17/13 with mildnon-obstructive disease and patent stent RCA) TEE with moderate AS, OSA not on CPAP, h/o PE (was on coumadin for 6 month, no PE on CTA 02/21/13), HTN, HLD, obesity, GERD, asthma, COPD, former tobacco abuse, depression admitted with right flank pain with nausea and vomiting found to have 9 mm right ureteropelvic junction calculus with obstructive uropathy. Per Dr. Jethro Bolus with urology patient may need a stent placed recommended to TF to [facility], [facility] ( pvt pt Dr. Larey Dresser) -patient denies chest pain, no acute SOB, has chronic DOE, no fever, no focal weakness; no hematemesis, or hematochezia;       Review of Systems: The patient denies anorexia, fever, weight loss,, vision loss, decreased hearing, hoarseness, chest pain, syncope, dyspnea on exertion, peripheral edema, balance deficits, hemoptysis, abdominal pain, melena, hematochezia, severe indigestion/heartburn, hematuria, incontinence, genital sores, muscle weakness, suspicious skin lesions, transient blindness, difficulty walking, depression, unusual weight change, abnormal bleeding, enlarged lymph nodes, angioedema, and breast masses.    Past Medical History  Diagnosis Date  . Asthma   . Hypertension   . Hyperlipidemia   . GERD (gastroesophageal reflux disease)   . Depression   . Aortic stenosis, moderate     per 11/2011 echo  . Blood transfusion     2013[facility]  . Headache(784.0)     h/o migraines  . Pulmonary embolism 02/08/12    S/P RT TOTAL KNEE ON 02/03/12--ON 02/08/12--DEVELOPED ACUTE SOB AND CHEST PAIN--AND DIAGNOSED WITH  PULMONARY EMBOLUS AND PNEUMONIA  . Restless leg syndrome   .  Myocardial infarction     PT THINKS SHE WAS DX WITH MI AT THE TIME OF HEART STENTING  . CAD (coronary artery disease)     Cath 2010 with DES x 1 RCA-- PT'S CARDIOLOGIST IS DR. MCALHANY  . Uterine fibroid     NO PROBLEMS AT PRESENT FROM THE FIBROIDS-STATES SHE IS POST MENOPAUSAL-LAST MENSES 2010 EXCEPT FOR EPISODE THIS YR OF BLEEDING RELATED TO FIBROIDS.  Marland Kitchen Heart murmur   . Pneumonia 02/08/12    HOSPITALIZED AT [facility] WITH PNEUMONIA AND WITH PULMONARY EMBOLUS  . Diabetes mellitus DIAGNOSED IN2010    ORAL MEDS  . Arthritis     PAIN AND SEVERE OA LEFT KNEE ; S/P RIGHT TKA ON 02/03/12; HAS LOWER BACK PAIN-UNABLE TO STAND MORE THAN 10 MIN; ARTHRITIS "ALL OVER"  . Neck pain   . Weakness     BOTH HANDS - S/P BILATERAL CARPAL TUNNEL RELEASE--BUT STILL HAS WEAKNESS--OFTEN DROPS THINGS  . Shortness of breath     with exertion  . Eczema     on back  . Kidney stones   . Anemia   . COPD (chronic obstructive pulmonary disease)    Past Surgical History  Procedure Laterality Date  . Tubal ligation    . Carpal tunnel release      Bilateral  . Cholecystectomy    . Hernia repair    . Cardiac catheterization    . Knee arthroplasty  02/03/2012    Procedure: COMPUTER ASSISTED TOTAL KNEE ARTHROPLASTY;  Surgeon: [name], MD;  Location[facility] OR;  Service: Orthopedics;  Laterality: Right;  Right total knee arthroplasty  . Coronary  angioplasty      2010 has stent in place  . Total knee arthroplasty  09/10/2012    Procedure: TOTAL KNEE ARTHROPLASTY;  Surgeon: [name], MD;  Location: [facility] ORS;  Service: Orthopedics;  Laterality: Left;  . Trigger finger release  09/10/2012    Procedure: RELEASE TRIGGER FINGER/A-1 PULLEY;  Surgeon: [name], MD;  Location: [facility] ORS;  Service: Orthopedics;  Laterality: Right;  Right Ring Finger  . Tee without cardioversion N/A 03/14/2013    Procedure: TRANSESOPHAGEAL ECHOCARDIOGRAM (TEE);  Surgeon: Lewayne Bunting, MD;  Location: [facility] ENDOSCOPY;   Service: Cardiovascular;  Laterality: N/A;  . Joint replacement      bil total knees  . Total knee revision Right 07/15/2013    Procedure: REVISION ARTHROPLASTY RIGHT KNEE;  Surgeon: [name], MD;  Location: [facility] ORS;  Service: Orthopedics;  Laterality: Right;   Social History:  reports that she quit smoking about 12 years ago. Her smoking use included Cigarettes. She has a 45 pack-year smoking history. She does not have any smokeless tobacco history on file. She reports that she does not drink alcohol or use illicit drugs. Lives at home:  where does patient live--home, ALF, SNF? and with whom if at home? Yes:  Can patient participate in ADLs?  No Known Allergies  Family History  Problem Relation Age of Onset  . Breast cancer Mother   . COPD Father     smoked  . Cancer Brother     Sinus  . Emphysema Mother     smoked  . Asthma Father   . Heart disease Mother   . Heart disease Father     (be sure to complete)  Prior to Admission medications   Medication Sig Start Date End Date Taking? Authorizing Provider  albuterol (PROVENTIL HFA;VENTOLIN HFA) 108 (90 BASE) MCG/ACT inhaler Inhale 2 puffs into the lungs every 4 (four) hours as needed for wheezing or shortness of breath (((PLAN B))).    Yes Historical Provider, MD  albuterol (PROVENTIL) (2.5 MG/3ML) 0.083% nebulizer solution Take 2.5 mg by nebulization every 4 (four) hours as needed for wheezing or shortness of breath (((PLAN C))).    Yes Historical Provider, MD  aspirin EC 325 MG tablet Take 1 tablet (325 mg total) by mouth 2 (two) times daily. 07/18/13  Yes Richardean Canal, PA-C  atorvastatin (LIPITOR) 80 MG tablet Take 80 mg by mouth at bedtime.   Yes Historical Provider, MD  budesonide-formoterol (SYMBICORT) 160-4.5 MCG/ACT inhaler Inhale 2 puffs into the lungs 2 (two) times daily.   Yes Historical Provider, MD  clopidogrel (PLAVIX) 75 MG tablet Take 75 mg by mouth every morning.    Yes Historical Provider, MD   cyclobenzaprine (FLEXERIL) 10 MG tablet Take 10 mg by mouth every 8 (eight) hours as needed for muscle spasms.  01/06/13  Yes Tatyana A Kirichenko, PA-C  dextromethorphan (DELSYM) 30 MG/5ML liquid Take 60 mg by mouth every 12 (twelve) hours.   Yes Historical Provider, MD  escitalopram (LEXAPRO) 20 MG tablet Take 20 mg by mouth every morning.    Yes Historical Provider, MD  ezetimibe (ZETIA) 10 MG tablet Take 1 tablet (10 mg total) by mouth every evening. 03/21/13  Yes Kathleene Hazel, MD  famotidine (PEPCID) 20 MG tablet Take 20 mg by mouth at bedtime.   Yes Historical Provider, MD  ferrous sulfate 325 (65 FE) MG tablet Take 1 tablet (325 mg total) by mouth 3 (three) times daily after meals. 07/18/13  Yes  Richardean Canal, PA-C  furosemide (LASIX) 40 MG tablet Take 40 mg by mouth 2 (two) times daily.   Yes Historical Provider, MD  gabapentin (NEURONTIN) 300 MG capsule Take 300 mg by mouth 2 (two) times daily.    Yes Historical Provider, MD  glipiZIDE (GLUCOTROL) 5 MG tablet 1/2 tab with morning meal, 1 whole tab with evening meal   Yes Historical Provider, MD  hydrochlorothiazide (HYDRODIURIL) 25 MG tablet Take 25 mg by mouth every morning.    Yes Historical Provider, MD  losartan (COZAAR) 100 MG tablet Take 100 mg by mouth daily with breakfast.    Yes Historical Provider, MD  MAGNESIUM PO Take 1-2 tablets by mouth as needed (constipation).   Yes Historical Provider, MD  Multiple Vitamin (MULTIVITAMIN) tablet Take 1 tablet by mouth daily.   Yes Historical Provider, MD  omeprazole (PRILOSEC) 20 MG capsule Take 20 mg by mouth daily before breakfast.   Yes Historical Provider, MD  oxyCODONE-acetaminophen (ROXICET) 5-325 MG per tablet Take 1-2 tablets by mouth every 4 (four) hours as needed for pain. 07/18/13  Yes Richardean Canal, PA-C  potassium chloride SA (K-DUR,KLOR-CON) 20 MEQ tablet Take 40 mEq by mouth daily after breakfast.    Yes Historical Provider, MD   Physical Exam: Filed Vitals:    09/21/13 0930  BP: 121/58  Pulse: 116  Temp:   Resp:      General:  Alert, oriented   Eyes: EOMI; perrla   ENT: no oral ulcers   Neck: supple, no JVD   Cardiovascular: S1, S2 systolic murmur   Respiratory: CTA BL   Abdomen: soft, obese, NT   Skin: no rash   Musculoskeletal: NO LE edema   Psychiatric: no hallucinations   Neurologic: CN 2-12 intact motor 5/5 symmetric BL    Labs on Admission:  Basic Metabolic Panel:  Recent Labs Lab 09/21/13 0330  NA 142  K 4.2  CL 102  CO2 28  GLUCOSE 194*  BUN 16  CREATININE 0.81  CALCIUM 8.8   Liver Function Tests:  Recent Labs Lab 09/21/13 0330  AST 19  ALT 13  ALKPHOS 80  BILITOT 0.6  PROT 6.8  ALBUMIN 3.5    Recent Labs Lab 09/21/13 0330  LIPASE 29   No results found for this basename: AMMONIA,  in the last 168 hours CBC:  Recent Labs Lab 09/21/13 0330  WBC 10.1  NEUTROABS 6.9  HGB 12.4  HCT 37.2  MCV 83.0  PLT 213   Cardiac Enzymes: No results found for this basename: CKTOTAL, CKMB, CKMBINDEX, TROPONINI,  in the last 168 hours  BNP (last 3 results) No results found for this basename: PROBNP,  in the last 8760 hours CBG:  Recent Labs Lab 09/21/13 0355  GLUCAP 190*    Radiological Exams on Admission: Ct Abdomen Pelvis Wo Contrast  09/21/2013   CLINICAL DATA:  Right flank pain.  EXAM: CT ABDOMEN AND PELVIS WITHOUT CONTRAST  TECHNIQUE: Multidetector CT imaging of the abdomen and pelvis was performed following the standard protocol without intravenous contrast.  COMPARISON:  02/15/2012  FINDINGS: BODY WALL: Status post ventral abdominal wall hernia repair using mesh. As noted previously, there is fatty herniation at the upper and lower margins of the mesh.  LOWER CHEST:  Mediastinum: Coronary artery atherosclerosis and probable mild cardiomegaly.  ABDOMEN/PELVIS:  Liver: Diffuse fatty infiltration.  Biliary: Cholecystectomy.  Pancreas: Unremarkable.  Spleen: Unremarkable.  Adrenals:  Unremarkable.  Kidneys and ureters: 9 mm stone at the right ureteropelvic junction with  proximal hydronephrosis, ipsilateral renomegaly, and ipsilateral perinephric edema. There is a punctate stone associated with the lower pole left kidney, nonobstructive.  Bladder: Unremarkable.  Bowel: No obstruction.  Retroperitoneum: 2 cm [name] nodule or lymph node, left periaortic at the level of the upper pole left kidney, unchanged from prior.  Peritoneum: No free fluid or gas.  Reproductive: Unremarkable.  Vascular: No acute abnormality.  OSSEOUS: No acute abnormalities. No suspicious lytic or blastic lesions.  IMPRESSION: 1. 9 mm right ureteropelvic junction calculus with obstructive uropathy. 2. Ventral abdominal wall hernia repair using mesh. There is chronic fat herniation superior and inferior to the repair.   Electronically Signed   By: Tiburcio Pea   On: 09/21/2013 05:01    EKG: Independently reviewed. Pend   Assessment/Plan Active Problems:   UTI (lower urinary tract infection)   Nephrolithiasis   CAD (coronary artery disease)   Hypertension   DM (diabetes mellitus)   COPD (chronic obstructive pulmonary disease)   OSA (obstructive sleep apnea)   52 y.o. female CAD (s/p PTCA stent RCA -2010 Cardiac cath on 03/17/13 with mildnon-obstructive disease and patent stent RCA) TEE with moderate AS, OSA not on CPAP, h/o PE (was on coumadin for 6 month, no PE on CTA 02/21/13), HTN, HLD, obesity, GERD, asthma, COPD, former tobacco abuse, depression admitted with right flank pain with nausea and vomiting found to have 9 mm right ureteropelvic junction calculus with obstructive uropathy. Per Dr. Jethro Bolus with urology patient may need a stent placed recommended to TF to [facility], [facility] ( pvt pt Dr. Larey Dresser)  1. Nephrolithiasis; 9 mm right ureteropelvic junction calculus with obstructive uropathy. Per Dr. Jethro Bolus with urology patient may need a stent placed recommended to TF to  [facility] ( pvt pt Dr. Larey Dresser) -cont pain control; NPO, cystoscopy and right retrograde pyelogram and right JJ stent per urology   2. UTI; hemodynamically stable; started on IV atx/ceftriaxone; pend urine c/s  3. CAD (s/p PTCA stent RCA -2010 Cardiac cath on 03/17/13 with mildnon-obstructive disease and patent stent RCA) TEE with moderate AS; clinically stable; no active chest pain  -cont BB, ASA, plavix, statin; f/u ECG; hold diuretic(takes lasix, hctz+KCL) while NPO on IVF   4. OSA not on CPAP, h/o PE (was on coumadin for 6 month, no PE on CTA 02/21/13) -cont oxygen, need close monitoring periop; prn with CPAP; DVT prophylaxis   5. HTN stable; cont home meds; hold diuretics while NPO    6. COPD, h/o asthma; no clinical evidence of acute exacerbation;  -obtain preop CXR; cont prn bronchodilators; cont oxygen; CPAP prn   7. DM on PO meds/glipizide; last HA1C-6.2 (2012) -ISS while inpatient; recheck HA1C;   8. ? H/o hypothyroidism; check TSH    preop eval: patient has RCRI score of 2; but moderate AS with AVA of 1.2 LVEF 60%; and h/o PE with OSA -wil obtain ECG, CXR; need aggressive DVT prophylaxis, and preoperative close monitoring with CPAP  DVT prophylaxis: SCD; lovenox; but close monitor due to ASA plavix   Urology:  if consultant consulted, please document name and whether formally or informally consulted  Code Status: full  (must indicate code status--if unknown or must be presumed, indicate so) Family Communication: not present at the bedside  (indicate person spoken with, if applicable, with phone number if by telephone) Disposition Plan: being TF to [facility] hospital  (indicate anticipated LOS)  Time spent: > 35 minutes   Antero Derosia N [facility] Pager  [phone]  If 7PM-7AM, please contact night-coverage [email or web address] Password [facility] 09/21/2013, 9:35 AM

## 2013-09-21 NOTE — Preoperative (Signed)
Beta Blockers   Reason not to administer Beta Blockers:Not Applicable 

## 2013-09-22 ENCOUNTER — Encounter (HOSPITAL_COMMUNITY): Payer: Self-pay | Admitting: Urology

## 2013-09-22 DIAGNOSIS — N1330 Unspecified hydronephrosis: Secondary | ICD-10-CM

## 2013-09-22 DIAGNOSIS — D72829 Elevated white blood cell count, unspecified: Secondary | ICD-10-CM | POA: Diagnosis present

## 2013-09-22 DIAGNOSIS — N200 Calculus of kidney: Secondary | ICD-10-CM

## 2013-09-22 DIAGNOSIS — N390 Urinary tract infection, site not specified: Secondary | ICD-10-CM

## 2013-09-22 DIAGNOSIS — E119 Type 2 diabetes mellitus without complications: Secondary | ICD-10-CM

## 2013-09-22 LAB — HEMOGLOBIN A1C
Hgb A1c MFr Bld: 6.4 % — ABNORMAL HIGH (ref <5.7)
Mean Plasma Glucose: 137 mg/dL — ABNORMAL HIGH (ref <117)

## 2013-09-22 LAB — CBC
HCT: 33.9 % — ABNORMAL LOW (ref 36.0–46.0)
MCHC: 30.7 g/dL (ref 30.0–36.0)
Platelets: 167 10*3/uL (ref 150–400)
RDW: 14.3 % (ref 11.5–15.5)
WBC: 17.7 10*3/uL — ABNORMAL HIGH (ref 4.0–10.5)

## 2013-09-22 LAB — GLUCOSE, CAPILLARY
Glucose-Capillary: 154 mg/dL — ABNORMAL HIGH (ref 70–99)
Glucose-Capillary: 97 mg/dL (ref 70–99)
Glucose-Capillary: 103 mg/dL — ABNORMAL HIGH (ref 70–99)

## 2013-09-22 LAB — BASIC METABOLIC PANEL
BUN: 16 mg/dL (ref 6–23)
Chloride: 104 mEq/L (ref 96–112)
GFR calc Af Amer: 71 mL/min — ABNORMAL LOW (ref >90)
GFR calc non Af Amer: 61 mL/min — ABNORMAL LOW (ref >90)
Potassium: 3.9 mEq/L (ref 3.5–5.1)
Sodium: 141 mEq/L (ref 135–145)

## 2013-09-22 MED ORDER — CLOPIDOGREL BISULFATE 75 MG PO TABS
75.0000 mg | ORAL_TABLET | Freq: Every morning | ORAL | Status: DC
  Administered 2013-09-22 – 2013-09-24 (×3): 75 mg via ORAL
  Filled 2013-09-22 (×3): qty 1

## 2013-09-22 MED ORDER — INSULIN ASPART 100 UNIT/ML ~~LOC~~ SOLN
0.0000 [IU] | Freq: Three times a day (TID) | SUBCUTANEOUS | Status: DC
  Administered 2013-09-22: 3 [IU] via SUBCUTANEOUS
  Administered 2013-09-23: 2 [IU] via SUBCUTANEOUS
  Administered 2013-09-23 – 2013-09-24 (×2): 3 [IU] via SUBCUTANEOUS
  Administered 2013-09-24: 2 [IU] via SUBCUTANEOUS

## 2013-09-22 MED ORDER — SODIUM CHLORIDE 0.9 % IV SOLN
INTRAVENOUS | Status: DC
  Administered 2013-09-22: 1000 mL via INTRAVENOUS

## 2013-09-22 MED ORDER — OXYCODONE HCL 5 MG PO TABS
5.0000 mg | ORAL_TABLET | ORAL | Status: DC | PRN
  Administered 2013-09-24: 10 mg via ORAL
  Filled 2013-09-22: qty 2

## 2013-09-22 MED ORDER — ACETAMINOPHEN 325 MG PO TABS
650.0000 mg | ORAL_TABLET | ORAL | Status: DC | PRN
  Administered 2013-09-22 – 2013-09-23 (×3): 650 mg via ORAL
  Filled 2013-09-22 (×3): qty 2

## 2013-09-22 NOTE — Anesthesia Postprocedure Evaluation (Signed)
Anesthesia Post Note  Patient: [name]  Procedure(s) Performed: Procedure(s) (LRB): CYSTOSCOPY WITH RIGHT RETROGRADE PYELOGRAM RIGHT DOUBLE J STENT  (Right)  Anesthesia type: General  Patient location: PACU  Post pain: Pain level controlled  Post assessment: Post-op Vital signs reviewed  Last Vitals:  Filed Vitals:   09/22/13 0300  BP: 147/52  Pulse: 94  Temp:   Resp: 17    Post vital signs: Reviewed  Level of consciousness: sedated  Complications: No apparent anesthesia complications

## 2013-09-22 NOTE — Progress Notes (Signed)
[facility] PROGRESS NOTE  [name] ZOX:[id number] DOB: March 28, 1961 DOA: 09/21/2013 PCP: Ron Parker, MD  Assessment/Plan: #1 nephrolithiasis/ Hydronephrosis Patient status post cystoscopy with retrograde oh gram and a right ureteral stent placement 09/21/2013. Clinical improvement. Patient with a leukocytosis. Patient still spiking fevers. Urine cultures are pending. Continue IV Rocephin. Pain management. Neurology following and appreciate input and recommendations.  #2 urinary tract infection Urine cultures are pending. Continue IV Rocephin.  #3 coronary artery disease Stable. Continue beta blocker. Urology to advise when to resume Plavix and aspirin. Continue statin. Decrease IV fluids. Follow.  #4 obstructive sleep apnea CPAP each bedtime.  #5 hypertension Continue Cozaar. Diuretics on hold. Follow.  #6 COPD Stable. Continue Symbicort. Nebs as needed.  #7 diabetes mellitus CBGs have ranged between 134-176. Check HgbA1C. Continue sliding scale insulin.  #8 prophylaxis PPI for GI prophylaxis. SCDs for DVT prophylaxis.  Code Status: Full Family Communication: Updated patient no family at bedside Disposition Plan:  Home when medically stable.   Consultants:  Urology: Dr. Patsi Sears 09/21/2013  Procedures:  CT abdomen and pelvis 09/21/2013  Chest x-ray 09/21/2013  Cystoscopy/right retrograde pyelogram/right ureteral stent placement per Dr. Mena Goes 09/21/2013  Antibiotics:  IV Rocephin 09/21/2013  HPI/Subjective: Patient states right flank pain and lower abdominal pain improved from admission. Patient asking to eat. Patient asking to Foley catheter to be taken. No other complaints.  Objective: Filed Vitals:   09/22/13 0800  BP: 158/71  Pulse: 92  Temp:   Resp:     Intake/Output Summary (Last 24 hours) at 09/22/13 1027 Last data filed at 09/22/13 1000  Gross per 24 hour  Intake   1475 ml  Output   1121 ml  Net    354 ml   Filed  Weights   09/21/13 1209 09/21/13 2000 09/22/13 0400  Weight: 145.6 kg (320 lb 15.8 oz) 153.6 kg (338 lb 10 oz) 153.2 kg (337 lb 11.9 oz)    Exam:   General:  NAD  Cardiovascular: RRR  Respiratory: CTAB  Abdomen: Obese, soft, suprapubic TTP, R CVA tenderness  Musculoskeletal: No c/c/e  Data Reviewed: Basic Metabolic Panel:  Recent Labs Lab 09/21/13 0330 09/21/13 1240 09/22/13 0325  NA 142  --  141  K 4.2  --  3.9  CL 102  --  104  CO2 28  --  31  GLUCOSE 194*  --  140*  BUN 16  --  16  CREATININE 0.81 1.14* 1.02  CALCIUM 8.8  --  8.7   Liver Function Tests:  Recent Labs Lab 09/21/13 0330  AST 19  ALT 13  ALKPHOS 80  BILITOT 0.6  PROT 6.8  ALBUMIN 3.5    Recent Labs Lab 09/21/13 0330  LIPASE 29   No results found for this basename: AMMONIA,  in the last 168 hours CBC:  Recent Labs Lab 09/21/13 0330 09/21/13 1240 09/22/13 0325  WBC 10.1 21.0* 17.7*  NEUTROABS 6.9  --   --   HGB 12.4 12.1 10.4*  HCT 37.2 37.9 33.9*  MCV 83.0 83.7 85.0  PLT 213 196 167   Cardiac Enzymes: No results found for this basename: CKTOTAL, CKMB, CKMBINDEX, TROPONINI,  in the last 168 hours BNP (last 3 results) No results found for this basename: PROBNP,  in the last 8760 hours CBG:  Recent Labs Lab 09/21/13 0355 09/21/13 1601 09/21/13 1826 09/21/13 2038  GLUCAP 190* 162* 176* 134*    Recent Results (from the past 240 hour(s))  SURGICAL PCR SCREEN  Status: None   Collection Time    09/21/13  4:09 PM      Result Value Range Status   MRSA, PCR NEGATIVE  NEGATIVE Final   Staphylococcus aureus NEGATIVE  NEGATIVE Final   Comment:            The Xpert SA Assay (FDA     approved for NASAL specimens     in patients over 85 years of age),     is one component of     a comprehensive surveillance     program.  Test performance has     been validated by [employer] for patients greater     than or equal to 30 year old.     It is not intended     to  diagnose infection nor to     guide or monitor treatment.     Studies: Ct Abdomen Pelvis Wo Contrast  09/21/2013   CLINICAL DATA:  Right flank pain.  EXAM: CT ABDOMEN AND PELVIS WITHOUT CONTRAST  TECHNIQUE: Multidetector CT imaging of the abdomen and pelvis was performed following the standard protocol without intravenous contrast.  COMPARISON:  02/15/2012  FINDINGS: BODY WALL: Status post ventral abdominal wall hernia repair using mesh. As noted previously, there is fatty herniation at the upper and lower margins of the mesh.  LOWER CHEST:  Mediastinum: Coronary artery atherosclerosis and probable mild cardiomegaly.  ABDOMEN/PELVIS:  Liver: Diffuse fatty infiltration.  Biliary: Cholecystectomy.  Pancreas: Unremarkable.  Spleen: Unremarkable.  Adrenals: Unremarkable.  Kidneys and ureters: 9 mm stone at the right ureteropelvic junction with proximal hydronephrosis, ipsilateral renomegaly, and ipsilateral perinephric edema. There is a punctate stone associated with the lower pole left kidney, nonobstructive.  Bladder: Unremarkable.  Bowel: No obstruction.  Retroperitoneum: 2 cm long nodule or lymph node, left periaortic at the level of the upper pole left kidney, unchanged from prior.  Peritoneum: No free fluid or gas.  Reproductive: Unremarkable.  Vascular: No acute abnormality.  OSSEOUS: No acute abnormalities. No suspicious lytic or blastic lesions.  IMPRESSION: 1. 9 mm right ureteropelvic junction calculus with obstructive uropathy. 2. Ventral abdominal wall hernia repair using mesh. There is chronic fat herniation superior and inferior to the repair.   Electronically Signed   By: Tiburcio Pea   On: 09/21/2013 05:01   Portable Chest 1 View  09/21/2013   CLINICAL DATA:  Short of breath, history of asthma  EXAM: PORTABLE CHEST - 1 VIEW  COMPARISON:  Chest x-ray of 07/11/2013 and CT chest of 09/01/2013  FINDINGS: Minimal opacity at the left lung base probably represent scarring. No definite effusion is  seen. The heart is mildly enlarged. No bony abnormality is noted.  IMPRESSION: Probable scarring at the left lung base. No definite active process.   Electronically Signed   By: Dwyane Dee M.D.   On: 09/21/2013 10:36    Scheduled Meds: . aspirin EC  325 mg Oral BID  . atorvastatin  80 mg Oral QHS  . budesonide-formoterol  2 puff Inhalation BID  . cefTRIAXone (ROCEPHIN)  IV  1 g Intravenous Q24H  . escitalopram  20 mg Oral q morning - 10a  . ezetimibe  10 mg Oral QPM  . famotidine  20 mg Oral QHS  . ferrous sulfate  325 mg Oral TID PC  . gabapentin  300 mg Oral BID  . insulin aspart  0-9 Units Subcutaneous TID WC  . losartan  100 mg Oral Daily  .  pantoprazole  40 mg Oral Daily  . sodium chloride  3 mL Intravenous Q12H   Continuous Infusions: . sodium chloride 1,000 mL (09/22/13 0943)    Principal Problem:   Nephrolithiasis Active Problems:   CAD (coronary artery disease)   Hypertension   GERD (gastroesophageal reflux disease)   DM (diabetes mellitus)   COPD (chronic obstructive pulmonary disease)   UTI (lower urinary tract infection)   OSA (obstructive sleep apnea)   Leukocytosis, unspecified   Hydronephrosis, right    Time spent: > 35 mins    [facility]  [facility] Pager [phone]. If 7PM-7AM, please contact night-coverage at [email or web address], password [facility] 09/22/2013, 10:27 AM  LOS: 1 day

## 2013-09-22 NOTE — Progress Notes (Signed)
Pt refuses CPAP  RT to monitor and assess as needed.  

## 2013-09-22 NOTE — Progress Notes (Signed)
Patient ID: [name], female   DOB: 01-14-1961, 52 y.o.   MRN: [id number]  Pt looks and feels much better today.   Urine clear.   BMET    Component Value Date/Time   NA 141 09/22/2013 0325   K 3.9 09/22/2013 0325   CL 104 09/22/2013 0325   CO2 31 09/22/2013 0325   GLUCOSE 140* 09/22/2013 0325   BUN 16 09/22/2013 0325   CREATININE 1.02 09/22/2013 0325   CREATININE 0.87 07/09/2012 1603   CALCIUM 8.7 09/22/2013 0325   GFRNONAA 61* 09/22/2013 0325   GFRAA 71* 09/22/2013 0325    CBC    Component Value Date/Time   WBC 17.7* 09/22/2013 0325   RBC 3.99 09/22/2013 0325   RBC 2.80* 02/10/2012 1900   HGB 10.4* 09/22/2013 0325   HCT 33.9* 09/22/2013 0325   PLT 167 09/22/2013 0325   MCV 85.0 09/22/2013 0325   MCH 26.1 09/22/2013 0325   MCHC 30.7 09/22/2013 0325   RDW 14.3 09/22/2013 0325   LYMPHSABS 2.2 09/21/2013 0330   MONOABS 0.6 09/21/2013 0330   EOSABS 0.3 09/21/2013 0330   BASOSABS 0.0 09/21/2013 0330    Imp - pyelonephritis, right ureteral stone s/p right stent - vitals and WBC improved today.  Plan - -d/c foley -transition to po abx based on Cx -F/u with me in 2-3 weeks to plan stone treatment. I discussed no treatment, endoscopy, ESWL with pt and she is leaning toward endoscopy.

## 2013-09-23 DIAGNOSIS — A498 Other bacterial infections of unspecified site: Secondary | ICD-10-CM

## 2013-09-23 DIAGNOSIS — N10 Acute tubulo-interstitial nephritis: Secondary | ICD-10-CM

## 2013-09-23 LAB — CBC WITH DIFFERENTIAL/PLATELET
Basophils Absolute: 0 10*3/uL (ref 0.0–0.1)
Basophils Relative: 0 % (ref 0–1)
Eosinophils Absolute: 0.3 10*3/uL (ref 0.0–0.7)
Lymphs Abs: 1.1 10*3/uL (ref 0.7–4.0)
MCH: 26.5 pg (ref 26.0–34.0)
MCHC: 31 g/dL (ref 30.0–36.0)
Neutrophils Relative %: 81 % — ABNORMAL HIGH (ref 43–77)
Platelets: 157 10*3/uL (ref 150–400)
RBC: 4.04 MIL/uL (ref 3.87–5.11)
RDW: 14.1 % (ref 11.5–15.5)

## 2013-09-23 LAB — GLUCOSE, CAPILLARY
Glucose-Capillary: 139 mg/dL — ABNORMAL HIGH (ref 70–99)
Glucose-Capillary: 185 mg/dL — ABNORMAL HIGH (ref 70–99)
Glucose-Capillary: 94 mg/dL (ref 70–99)

## 2013-09-23 LAB — URINE CULTURE: Colony Count: >=100000

## 2013-09-23 LAB — BASIC METABOLIC PANEL
BUN: 12 mg/dL (ref 6–23)
Calcium: 9.2 mg/dL (ref 8.4–10.5)
GFR calc non Af Amer: 81 mL/min — ABNORMAL LOW (ref >90)
Glucose, Bld: 152 mg/dL — ABNORMAL HIGH (ref 70–99)
Sodium: 140 mEq/L (ref 135–145)

## 2013-09-23 MED ORDER — CIPROFLOXACIN IN D5W 400 MG/200ML IV SOLN
400.0000 mg | Freq: Two times a day (BID) | INTRAVENOUS | Status: DC
  Administered 2013-09-23 – 2013-09-24 (×3): 400 mg via INTRAVENOUS
  Filled 2013-09-23 (×5): qty 200

## 2013-09-23 MED ORDER — ALBUTEROL SULFATE (5 MG/ML) 0.5% IN NEBU
2.5000 mg | INHALATION_SOLUTION | Freq: Four times a day (QID) | RESPIRATORY_TRACT | Status: DC
  Administered 2013-09-23 – 2013-09-24 (×5): 2.5 mg via RESPIRATORY_TRACT
  Filled 2013-09-23 (×6): qty 0.5

## 2013-09-23 MED ORDER — FUROSEMIDE 40 MG PO TABS
40.0000 mg | ORAL_TABLET | Freq: Two times a day (BID) | ORAL | Status: DC
  Administered 2013-09-23 (×2): 40 mg via ORAL
  Filled 2013-09-23 (×5): qty 1

## 2013-09-23 NOTE — Progress Notes (Signed)
Spoke to patient regarding prn order for cpap.  Pt refused cpap at this time, stating she wears 2.5-3l [place] at home at night.  Pt was advised that RT is available all night should she change her mind.  Pt remains on 2l [place], sats 93%.

## 2013-09-23 NOTE — Progress Notes (Signed)
Patient ID: [name], female   DOB: 01-18-1961, 52 y.o.   MRN: [id number]  Pt without complaints. Voiding well.   Urine Cx e coli  BMET    Component Value Date/Time   NA 140 09/23/2013 0539   K 3.8 09/23/2013 0539   CL 102 09/23/2013 0539   CO2 32 09/23/2013 0539   GLUCOSE 152* 09/23/2013 0539   BUN 12 09/23/2013 0539   CREATININE 0.81 09/23/2013 0539   CREATININE 0.87 07/09/2012 1603   CALCIUM 9.2 09/23/2013 0539   GFRNONAA 81* 09/23/2013 0539   GFRAA >90 09/23/2013 0539    CBC    Component Value Date/Time   WBC 10.6* 09/23/2013 0539   RBC 4.04 09/23/2013 0539   RBC 2.80* 02/10/2012 1900   HGB 10.7* 09/23/2013 0539   HCT 34.5* 09/23/2013 0539   PLT 157 09/23/2013 0539   MCV 85.4 09/23/2013 0539   MCH 26.5 09/23/2013 0539   MCHC 31.0 09/23/2013 0539   RDW 14.1 09/23/2013 0539   LYMPHSABS 1.1 09/23/2013 0539   MONOABS 0.7 09/23/2013 0539   EOSABS 0.3 09/23/2013 0539   BASOSABS 0.0 09/23/2013 0539    Imp - POD#2 right ureteral stent - Plan-she will see me in 2-3 weeks to plan for stone treatment. Cipro a good choice for abx.  Greatly appreciate primary team's excellent care.

## 2013-09-23 NOTE — Progress Notes (Signed)
[facility] PROGRESS NOTE  [name]:[id number] DOB: [birth year] DOA: 09/21/2013 PCP: Ron Parker, MD  Assessment/Plan: #1 nephrolithiasis/ Hydronephrosis Patient status post cystoscopy with retrograde oh gram and a right ureteral stent placement 09/21/2013. Clinical improvement. Patient with a leukocytosis. Urine cultures c/w E coli with intermediate sensitivity to rocephin. Change to IV Cipro. Pain management. Stone treatment to be planned in 2-3 weeks per urology. Urology following and appreciate input and recommendations.  #2 EColi urinary tract infection/Pyelonephritis Urine cultures c/w EColi with intermediate sensitivity to rocephin. Will change to IV Cipro, and treat for 14 days.  #3 coronary artery disease Stable. Continue beta blocker. Resume Plavix and aspirin. Continue statin. Resume lasix. Follow.  #4 obstructive sleep apnea CPAP each bedtime.  #5 hypertension Continue Cozaar. Resume lasix. Follow.  #6 COPD Stable. Continue Symbicort. Nebs as needed.  #7 diabetes mellitus CBGs have ranged between 97-139. HgbA1C = 6.4. Continue sliding scale insulin.  #8 prophylaxis PPI for GI prophylaxis. SCDs for DVT prophylaxis.  Code Status: Full Family Communication: Updated patient no family at bedside Disposition Plan:  Transfer to medsurg    Consultants:  Urology: Dr. Patsi Sears 09/21/2013  Procedures:  CT abdomen and pelvis 09/21/2013  Chest x-ray 09/21/2013  Cystoscopy/right retrograde pyelogram/right ureteral stent placement per Dr. Mena Goes 09/21/2013  Antibiotics:  IV Rocephin 09/21/2013--->09/23/13  IV Cipro 09/23/13  HPI/Subjective: Patient states right flank pain and lower abdominal pain improved from admission. No other complaints.  Objective: Filed Vitals:   09/23/13 0812  BP: 121/60  Pulse: 95  Temp:   Resp: 22    Intake/Output Summary (Last 24 hours) at 09/23/13 0909 Last data filed at 09/23/13 0500  Gross per 24  hour  Intake 776.25 ml  Output   2065 ml  Net -1288.75 ml   Filed Weights   09/21/13 2000 09/22/13 0400 09/23/13 0400  Weight: 153.6 kg (338 lb 10 oz) 153.2 kg (337 lb 11.9 oz) 154.6 kg (340 lb 13.3 oz)    Exam:   General:  NAD  Cardiovascular: RRR  Respiratory: CTAB. Decreased BS in bases.  Abdomen: Obese, soft, suprapubic TTP, decreased R CVA tenderness  Musculoskeletal: No c/c/e  Data Reviewed: Basic Metabolic Panel:  Recent Labs Lab 09/21/13 0330 09/21/13 1240 09/22/13 0325 09/23/13 0539  NA 142  --  141 140  K 4.2  --  3.9 3.8  CL 102  --  104 102  CO2 28  --  31 32  GLUCOSE 194*  --  140* 152*  BUN 16  --  16 12  CREATININE 0.81 1.14* 1.02 0.81  CALCIUM 8.8  --  8.7 9.2   Liver Function Tests:  Recent Labs Lab 09/21/13 0330  AST 19  ALT 13  ALKPHOS 80  BILITOT 0.6  PROT 6.8  ALBUMIN 3.5    Recent Labs Lab 09/21/13 0330  LIPASE 29   No results found for this basename: AMMONIA,  in the last 168 hours CBC:  Recent Labs Lab 09/21/13 0330 09/21/13 1240 09/22/13 0325 09/23/13 0539  WBC 10.1 21.0* 17.7* 10.6*  NEUTROABS 6.9  --   --  8.5*  HGB 12.4 12.1 10.4* 10.7*  HCT 37.2 37.9 33.9* 34.5*  MCV 83.0 83.7 85.0 85.4  PLT 213 196 167 157   Cardiac Enzymes: No results found for this basename: CKTOTAL, CKMB, CKMBINDEX, TROPONINI,  in the last 168 hours BNP (last 3 results) No results found for this basename: PROBNP,  in the last 8760 hours CBG:  Recent Labs Lab  09/22/13 0751 09/22/13 1203 09/22/13 1704 09/22/13 2225 09/23/13 0753  GLUCAP 121* 154* 97 103* 139*    Recent Results (from the past 240 hour(s))  URINE CULTURE     Status: None   Collection Time    09/21/13  6:09 AM      Result Value Range Status   Specimen Description URINE, CATHETERIZED   Final   Special Requests NONE   Final   Culture  Setup Time     Final   Value: 09/21/2013 14:50     Performed at [facility] Count     Final   Value:  >=100,000 COLONIES/ML     Performed at [facility]   Culture     Final   Value: ESCHERICHIA COLI     Performed at [facility]   Report Status 09/23/2013 FINAL   Final   Organism ID, Bacteria ESCHERICHIA COLI   Final  SURGICAL PCR SCREEN     Status: None   Collection Time    09/21/13  4:09 PM      Result Value Range Status   MRSA, PCR NEGATIVE  NEGATIVE Final   Staphylococcus aureus NEGATIVE  NEGATIVE Final   Comment:            The Xpert SA Assay (FDA     approved for NASAL specimens     in patients over 60 years of age),     is one component of     a comprehensive surveillance     program.  Test performance has     been validated by [employer] for patients greater     than or equal to 76 year old.     It is not intended     to diagnose infection nor to     guide or monitor treatment.     Studies: Portable Chest 1 View  09/21/2013   CLINICAL DATA:  Short of breath, history of asthma  EXAM: PORTABLE CHEST - 1 VIEW  COMPARISON:  Chest x-ray of 07/11/2013 and CT chest of 09/01/2013  FINDINGS: Minimal opacity at the left lung base probably represent scarring. No definite effusion is seen. The heart is mildly enlarged. No bony abnormality is noted.  IMPRESSION: Probable scarring at the left lung base. No definite active process.   Electronically Signed   By: Dwyane Dee M.D.   On: 09/21/2013 10:36    Scheduled Meds: . albuterol  2.5 mg Nebulization Q6H  . aspirin EC  325 mg Oral BID  . atorvastatin  80 mg Oral QHS  . budesonide-formoterol  2 puff Inhalation BID  . cefTRIAXone (ROCEPHIN)  IV  1 g Intravenous Q24H  . clopidogrel  75 mg Oral q morning - 10a  . escitalopram  20 mg Oral q morning - 10a  . ezetimibe  10 mg Oral QPM  . ferrous sulfate  325 mg Oral TID PC  . gabapentin  300 mg Oral BID  . insulin aspart  0-15 Units Subcutaneous TID WC  . losartan  100 mg Oral Daily  . pantoprazole  40 mg Oral Daily  . sodium chloride  3 mL Intravenous Q12H    Continuous Infusions:    Principal Problem:   Nephrolithiasis Active Problems:   CAD (coronary artery disease)   Hypertension   GERD (gastroesophageal reflux disease)   DM (diabetes mellitus)   COPD (chronic obstructive pulmonary disease)   E. coli UTI  OSA (obstructive sleep apnea)   Leukocytosis, unspecified   Hydronephrosis, right   Acute pyelonephritis    Time spent: > 35 mins    [facility]  [facility] Pager [phone]. If 7PM-7AM, please contact night-coverage at [email or web address], password [facility] 09/23/2013, 9:09 AM  LOS: 2 days

## 2013-09-23 NOTE — Progress Notes (Signed)
Pt transferred to 1514. Left unit in wheelchair pushed by this RN. Left and delivered in good condition. Vwilliams,rn.

## 2013-09-23 NOTE — Progress Notes (Signed)
Report called and given to [facility], [email or web address],rn.

## 2013-09-24 LAB — CBC
Hemoglobin: 9.8 g/dL — ABNORMAL LOW (ref 12.0–15.0)
MCH: 26.1 pg (ref 26.0–34.0)
RBC: 3.75 MIL/uL — ABNORMAL LOW (ref 3.87–5.11)

## 2013-09-24 LAB — GLUCOSE, CAPILLARY
Glucose-Capillary: 133 mg/dL — ABNORMAL HIGH (ref 70–99)
Glucose-Capillary: 165 mg/dL — ABNORMAL HIGH (ref 70–99)
Glucose-Capillary: 119 mg/dL — ABNORMAL HIGH (ref 70–99)

## 2013-09-24 LAB — BASIC METABOLIC PANEL
BUN: 11 mg/dL (ref 6–23)
CO2: 36 mEq/L — ABNORMAL HIGH (ref 19–32)
Calcium: 8.8 mg/dL (ref 8.4–10.5)
Chloride: 99 mEq/L (ref 96–112)
Creatinine, Ser: 0.8 mg/dL (ref 0.50–1.10)
Glucose, Bld: 125 mg/dL — ABNORMAL HIGH (ref 70–99)

## 2013-09-24 MED ORDER — POTASSIUM CHLORIDE CRYS ER 20 MEQ PO TBCR
40.0000 meq | EXTENDED_RELEASE_TABLET | Freq: Once | ORAL | Status: AC
  Administered 2013-09-24: 40 meq via ORAL
  Filled 2013-09-24: qty 2

## 2013-09-24 MED ORDER — OXYCODONE-ACETAMINOPHEN 5-325 MG PO TABS: 1.0000 | ORAL_TABLET | ORAL | Status: DC | PRN

## 2013-09-24 MED ORDER — CIPROFLOXACIN HCL 500 MG PO TABS: 500.0000 mg | ORAL_TABLET | Freq: Two times a day (BID) | ORAL | Status: AC

## 2013-09-24 NOTE — Progress Notes (Signed)
Patient ID: [name], female   DOB: 01/11/1961, 52 y.o.   MRN: [id number] 3 Days Post-Op Subjective: Patient doing well status post her double-J stent. She continues to do well.  Objective: Vital signs in last 24 hours: Temp:  [98 F (36.7 C)-98.5 F (36.9 C)] 98 F (36.7 C) (09/27 0454) Pulse Rate:  [86-95] 86 (09/27 0454) Resp:  [15-18] 15 (09/27 0454) BP: (119-152)/(50-75) 136/66 mmHg (09/27 0454) SpO2:  [93 %-98 %] 94 % (09/27 0754)  Intake/Output from previous day: 09/26 0701 - 09/27 0700 In: 880 [P.O.:480; IV Piggyback:400] Out: 3575 [Urine:3575] Intake/Output this shift:    Physical Exam:  Constitutional: Vital signs reviewed. WD WN in NAD   Eyes: PERRL, No scleral icterus.   Cardiovascular: RRR Pulmonary/Chest: Normal effort Abdominal: Soft. Non-tender, non-distended, bowel sounds are normal, no masses, organomegaly, or guarding present.  Genitourinary: Extremities: No cyanosis or edema   Lab Results:  Recent Labs  09/22/13 0325 09/23/13 0539 09/24/13 0528  HGB 10.4* 10.7* 9.8*  HCT 33.9* 34.5* 31.4*   BMET  Recent Labs  09/23/13 0539 09/24/13 0528  NA 140 142  K 3.8 3.4*  CL 102 99  CO2 32 36*  GLUCOSE 152* 125*  BUN 12 11  CREATININE 0.81 0.80  CALCIUM 9.2 8.8   No results found for this basename: LABPT, INR,  in the last 72 hours No results found for this basename: LABURIN,  in the last 72 hours Results for orders placed during the hospital encounter of 09/21/13  URINE CULTURE     Status: None   Collection Time    09/21/13  6:09 AM      Result Value Range Status   Specimen Description URINE, CATHETERIZED   Final   Special Requests NONE   Final   Culture  Setup Time     Final   Value: 09/21/2013 14:50     Performed at [facility] Count     Final   Value: >=100,000 COLONIES/ML     Performed at [facility]   Culture     Final   Value: ESCHERICHIA COLI     Performed at [facility]   Report  Status 09/23/2013 FINAL   Final   Organism ID, Bacteria ESCHERICHIA COLI   Final  SURGICAL PCR SCREEN     Status: None   Collection Time    09/21/13  4:09 PM      Result Value Range Status   MRSA, PCR NEGATIVE  NEGATIVE Final   Staphylococcus aureus NEGATIVE  NEGATIVE Final   Comment:            The Xpert SA Assay (FDA     approved for NASAL specimens     in patients over 75 years of age),     is one component of     a comprehensive surveillance     program.  Test performance has     been validated by [employer] for patients greater     than or equal to 76 year old.     It is not intended     to diagnose infection nor to     guide or monitor treatment.    Studies/Results: No results found.  Assessment/Plan:   Urosepsis with obstructing stone status post double-J stent. She'll need definitive stone management with Dr. Mena [name] in the next 2-3 weeks.  Could D/C from [facility] with appropriate oral ABs. Cipro or  Septra DS   LOS: 3 days   [name] S 09/24/2013, 9:00 AM

## 2013-09-24 NOTE — Progress Notes (Deleted)
Patient continues to refuse nocturnal CPAP.  

## 2013-09-24 NOTE — Discharge Summary (Signed)
Physician Discharge Summary  [name] YQM:[id number] DOB: 12/07/1961 DOA: 09/21/2013  PCP: Ron Parker, MD  Admit date: 09/21/2013 Discharge date: 09/24/2013  Time spent: 65 minutes  Recommendations for Outpatient Follow-up:  1. Patient is to followup with Dr. Mena Goes 2 weeks post discharge for further evaluation and management for stone treatment and followup on pyelonephritis.  Discharge Diagnoses:  Principal Problem:   Nephrolithiasis Active Problems:   CAD (coronary artery disease)   Hypertension   GERD (gastroesophageal reflux disease)   DM (diabetes mellitus)   COPD (chronic obstructive pulmonary disease)   E. coli UTI   OSA (obstructive sleep apnea)   Leukocytosis, unspecified   Hydronephrosis, right   Acute pyelonephritis   Discharge Condition: Stable and improved.  Diet recommendation: Low-salt diet.  Filed Weights   09/21/13 2000 09/22/13 0400 09/23/13 0400  Weight: 153.6 kg (338 lb 10 oz) 153.2 kg (337 lb 11.9 oz) 154.6 kg (340 lb 13.3 oz)    History of present illness:  [name] is a 52 y.o. female CAD (s/p PTCA stent RCA -2010 Cardiac cath on 03/17/13 with mildnon-obstructive disease and patent stent RCA) TEE with moderate AS, OSA not on CPAP, h/o PE (was on coumadin for 6 month, no PE on CTA 02/21/13), HTN, HLD, obesity, GERD, asthma, COPD, former tobacco abuse, depression admitted with right flank pain with nausea and vomiting found to have 9 mm right ureteropelvic junction calculus with obstructive uropathy. Per Dr. Jethro Bolus with urology patient may need a stent placed recommended to TF to [facility], [facility] ( pvt pt Dr. Larey Dresser)  -patient denies chest pain, no acute SOB, has chronic DOE, no fever, no focal weakness; no hematemesis, or hematochezia;    Hospital Course:  #1 nephrolithiasis/ Hydronephrosis  Patient had presented with right flank pain nausea and vomiting and found to have a 9 mm right ureteropelvic  junction calculus with obstructive uropathy per CT scan. Urology consultation was obtained and patient was transferred from [facility] to [facility] long for further evaluation and possible stent placement per urology. Patient was seen in consultation by urology and patient subsequently underwent cystoscopy with retrograde pyelogram and a right ureteral stent placement 09/21/2013. Urinalysis which was done was consistent with a UTI urine cultures were obtained. Patient was placed empirically on IV Rocephin and followed. Urine cultures grew out Escherichia coli with intermediate sensitivity to Rocephin. Patient's antibiotics were subsequently changed to IV Cipro. Patient improved clinically. Patient's pain was managed. Stone treatment to be planned in 2-3 weeks per urology. Patient be discharged in stable and improved condition on 13 days of oral ciprofloxacin to complete a two-week course on a bout of therapy and will followup with urology as outpatient.  #2 EColi urinary tract infection/Pyelonephritis  On admission urinalysis which was done was consistent with a UTI. Patient also did have some right flank pain. Patient was placed empirically on IV Rocephin. Urine cultures were done and grew out Escherichia coli. Sensitivities showed intermediate sensitivity to Rocephin and a such patient's antibiotic was switched to IV Cipro. Patient improved clinically. Patient's flank pain improved. Patient will be discharged home on 13 more days of oral ciprofloxacin to complete a two-week course of antibiotic therapy. Patient will followup with urology as outpatient.  #3 coronary artery disease  Stable. Patient was maintained on a beta blocker. Postoperatively patient's Plavix and aspirin was resumed. Patient was also maintained on a statin and her diuretics resumed. #4 obstructive sleep apnea  CPAP each bedtime.  #5  hypertension  Continued on Cozaar and  lasix.  #6 COPD  Stable. Continued on Symbicort. Nebs as  needed.  #7 diabetes mellitus  CBGs have ranged between 97-139. HgbA1C = 6.4. Maintained on sliding scale insulin.      Procedures: CT abdomen and pelvis 09/21/2013  Chest x-ray 09/21/2013  Cystoscopy/right retrograde pyelogram/right ureteral stent placement per Dr. Mena Goes 09/21/2013   Consultations: Urology: Dr. Patsi Sears 09/21/2013   Discharge Exam: Filed Vitals:   09/24/13 1459  BP: 158/76  Pulse: 82  Temp: 99.6 F (37.6 C)  Resp: 20    General: NAD Cardiovascular: RRR Respiratory: CTAB  Discharge Instructions  Discharge Orders   Future Appointments Provider Department Dept Phone   10/19/2013 9:00 AM [facility] [phone]   10/20/2013 10:00 AM Mariane Masters, [facility] [phone]   Future Orders Complete By Expires   Diet - low sodium heart healthy  As directed    Discharge instructions  As directed    Comments:     Follow up with Dr Mena Goes in 2 weeks.   Increase activity slowly  As directed        Medication List         albuterol 108 (90 BASE) MCG/ACT inhaler  Commonly known as:  PROVENTIL HFA;VENTOLIN HFA  Inhale 2 puffs into the lungs every 4 (four) hours as needed for wheezing or shortness of breath (((PLAN B))).     albuterol (2.5 MG/3ML) 0.083% nebulizer solution  Commonly known as:  PROVENTIL  Take 2.5 mg by nebulization every 4 (four) hours as needed for wheezing or shortness of breath (((PLAN C))).     aspirin EC 325 MG tablet  Take 1 tablet (325 mg total) by mouth 2 (two) times daily.     atorvastatin 80 MG tablet  Commonly known as:  LIPITOR  Take 80 mg by mouth at bedtime.     budesonide-formoterol 160-4.5 MCG/ACT inhaler  Commonly known as:  SYMBICORT  Inhale 2 puffs into the lungs 2 (two) times daily.     ciprofloxacin 500 MG tablet  Commonly known as:  CIPRO  Take 1 tablet (500 mg total) by mouth 2 (two) times daily. Take for 13 days then stop.      clopidogrel 75 MG tablet  Commonly known as:  PLAVIX  Take 75 mg by mouth every morning.     cyclobenzaprine 10 MG tablet  Commonly known as:  FLEXERIL  Take 10 mg by mouth every 8 (eight) hours as needed for muscle spasms.     dextromethorphan 30 MG/5ML liquid  Commonly known as:  DELSYM  Take 60 mg by mouth every 12 (twelve) hours.     escitalopram 20 MG tablet  Commonly known as:  LEXAPRO  Take 20 mg by mouth every morning.     ezetimibe 10 MG tablet  Commonly known as:  ZETIA  Take 1 tablet (10 mg total) by mouth every evening.     famotidine 20 MG tablet  Commonly known as:  PEPCID  Take 20 mg by mouth at bedtime.     ferrous sulfate 325 (65 FE) MG tablet  Take 1 tablet (325 mg total) by mouth 3 (three) times daily after meals.     furosemide 40 MG tablet  Commonly known as:  LASIX  Take 40 mg by mouth 2 (two) times daily.     gabapentin 300 MG capsule  Commonly known as:  NEURONTIN  Take 300 mg  by mouth 2 (two) times daily.     glipiZIDE 5 MG tablet  Commonly known as:  GLUCOTROL  1/2 tab with morning meal, 1 whole tab with evening meal     hydrochlorothiazide 25 MG tablet  Commonly known as:  HYDRODIURIL  Take 25 mg by mouth every morning.     losartan 100 MG tablet  Commonly known as:  COZAAR  Take 100 mg by mouth daily with breakfast.     MAGNESIUM PO  Take 1-2 tablets by mouth as needed (constipation).     multivitamin tablet  Take 1 tablet by mouth daily.     omeprazole 20 MG capsule  Commonly known as:  PRILOSEC  Take 20 mg by mouth daily before breakfast.     oxyCODONE-acetaminophen 5-325 MG per tablet  Commonly known as:  ROXICET  Take 1-2 tablets by mouth every 4 (four) hours as needed for pain.     potassium chloride SA 20 MEQ tablet  Commonly known as:  K-DUR,KLOR-CON  Take 40 mEq by mouth daily after breakfast.       No Known Allergies     Follow-up Information   Follow up with Antony Haste, MD In 2 weeks.    Specialty:  Urology   Contact information:   [address] [place] [address] [phone]        The results of significant diagnostics from this hospitalization (including imaging, microbiology, ancillary and laboratory) are listed below for reference.    Significant Diagnostic Studies: Ct Abdomen Pelvis Wo Contrast  09/21/2013   CLINICAL DATA:  Right flank pain.  EXAM: CT ABDOMEN AND PELVIS WITHOUT CONTRAST  TECHNIQUE: Multidetector CT imaging of the abdomen and pelvis was performed following the standard protocol without intravenous contrast.  COMPARISON:  02/15/2012  FINDINGS: BODY WALL: Status post ventral abdominal wall hernia repair using mesh. As noted previously, there is fatty herniation at the upper and lower margins of the mesh.  LOWER CHEST:  Mediastinum: Coronary artery atherosclerosis and probable mild cardiomegaly.  ABDOMEN/PELVIS:  Liver: Diffuse fatty infiltration.  Biliary: Cholecystectomy.  Pancreas: Unremarkable.  Spleen: Unremarkable.  Adrenals: Unremarkable.  Kidneys and ureters: 9 mm stone at the right ureteropelvic junction with proximal hydronephrosis, ipsilateral renomegaly, and ipsilateral perinephric edema. There is a punctate stone associated with the lower pole left kidney, nonobstructive.  Bladder: Unremarkable.  Bowel: No obstruction.  Retroperitoneum: 2 cm long nodule or lymph node, left periaortic at the level of the upper pole left kidney, unchanged from prior.  Peritoneum: No free fluid or gas.  Reproductive: Unremarkable.  Vascular: No acute abnormality.  OSSEOUS: No acute abnormalities. No suspicious lytic or blastic lesions.  IMPRESSION: 1. 9 mm right ureteropelvic junction calculus with obstructive uropathy. 2. Ventral abdominal wall hernia repair using mesh. There is chronic fat herniation superior and inferior to the repair.   Electronically Signed   By: Tiburcio Pea   On: 09/21/2013 05:01   Ct Chest Wo Contrast  09/01/2013   *RADIOLOGY  REPORT*  Clinical Data: Follow up pulmonary nodule  CT CHEST WITHOUT CONTRAST  Technique:  Multidetector CT imaging of the chest was performed following the standard protocol without IV contrast.  Comparison: 02/21/2013  Findings: Prior 8 mm patchy/nodular opacity in the superior segment right lower lobe has resolved, likely infectious/inflammatory.  No new/suspicious pulmonary nodules.  Mild scarring in the lingula and left lower lobe. No pleural effusion or pneumothorax.  Visualized thyroid is unremarkable.  Heart is top normal in  size.  No pericardial effusion.  Coronary atherosclerosis.  Atherosclerotic calcifications of the aortic arch.  Stable fluid density lesion along the anterior mediastinum (series 2/image 21), benign.  No suspicious mediastinal, hilar, or axillary lymphadenopathy.  Visualized upper abdomen is notable for cholecystectomy clips.  Degenerative changes of the visualized thoracolumbar spine.  IMPRESSION: Prior 8 mm patchy/nodular opacity in the superior segment right lower lobe has resolved, likely infectious/inflammatory.  No evidence of acute cardiopulmonary disease.   Original Report Authenticated By: Charline Bills, M.D.   Portable Chest 1 View  09/21/2013   CLINICAL DATA:  Short of breath, history of asthma  EXAM: PORTABLE CHEST - 1 VIEW  COMPARISON:  Chest x-ray of 07/11/2013 and CT chest of 09/01/2013  FINDINGS: Minimal opacity at the left lung base probably represent scarring. No definite effusion is seen. The heart is mildly enlarged. No bony abnormality is noted.  IMPRESSION: Probable scarring at the left lung base. No definite active process.   Electronically Signed   By: Dwyane Dee M.D.   On: 09/21/2013 10:36    Microbiology: Recent Results (from the past 240 hour(s))  URINE CULTURE     Status: None   Collection Time    09/21/13  6:09 AM      Result Value Range Status   Specimen Description URINE, CATHETERIZED   Final   Special Requests NONE   Final   Culture   Setup Time     Final   Value: 09/21/2013 14:50     Performed at [facility] Count     Final   Value: >=100,000 COLONIES/ML     Performed at [facility]   Culture     Final   Value: ESCHERICHIA COLI     Performed at [facility]   Report Status 09/23/2013 FINAL   Final   Organism ID, Bacteria ESCHERICHIA COLI   Final  SURGICAL PCR SCREEN     Status: None   Collection Time    09/21/13  4:09 PM      Result Value Range Status   MRSA, PCR NEGATIVE  NEGATIVE Final   Staphylococcus aureus NEGATIVE  NEGATIVE Final   Comment:            The Xpert SA Assay (FDA     approved for NASAL specimens     in patients over 59 years of age),     is one component of     a comprehensive surveillance     program.  Test performance has     been validated by [employer] for patients greater     than or equal to 60 year old.     It is not intended     to diagnose infection nor to     guide or monitor treatment.     Labs: Basic Metabolic Panel:  Recent Labs Lab 09/21/13 0330 09/21/13 1240 09/22/13 0325 09/23/13 0539 09/24/13 0528  NA 142  --  141 140 142  K 4.2  --  3.9 3.8 3.4*  CL 102  --  104 102 99  CO2 28  --  31 32 36*  GLUCOSE 194*  --  140* 152* 125*  BUN 16  --  16 12 11   CREATININE 0.81 1.14* 1.02 0.81 0.80  CALCIUM 8.8  --  8.7 9.2 8.8   Liver Function Tests:  Recent Labs Lab 09/21/13 0330  AST 19  ALT 13  ALKPHOS 80  BILITOT 0.6  PROT 6.8  ALBUMIN 3.5    Recent Labs Lab 09/21/13 0330  LIPASE 29   No results found for this basename: AMMONIA,  in the last 168 hours CBC:  Recent Labs Lab 09/21/13 0330 09/21/13 1240 09/22/13 0325 09/23/13 0539 09/24/13 0528  WBC 10.1 21.0* 17.7* 10.6* 8.3  NEUTROABS 6.9  --   --  8.5*  --   HGB 12.4 12.1 10.4* 10.7* 9.8*  HCT 37.2 37.9 33.9* 34.5* 31.4*  MCV 83.0 83.7 85.0 85.4 83.7  PLT 213 196 167 157 159   Cardiac Enzymes: No results found for this basename: CKTOTAL, CKMB,  CKMBINDEX, TROPONINI,  in the last 168 hours BNP: BNP (last 3 results) No results found for this basename: PROBNP,  in the last 8760 hours CBG:  Recent Labs Lab 09/23/13 1207 09/23/13 1645 09/24/13 0742 09/24/13 1144 09/24/13 1614  GLUCAP 185* 94 133* 165* 119*       Signed:  THOMPSON,DANIEL  [facility] 09/24/2013, 5:18 PM

## 2013-09-26 ENCOUNTER — Other Ambulatory Visit: Payer: Self-pay | Admitting: Internal Medicine

## 2013-09-26 ENCOUNTER — Encounter: Payer: Self-pay | Admitting: Internal Medicine

## 2013-09-26 DIAGNOSIS — G4734 Idiopathic sleep related nonobstructive alveolar hypoventilation: Secondary | ICD-10-CM

## 2013-10-03 ENCOUNTER — Encounter: Payer: Self-pay | Admitting: Internal Medicine

## 2013-10-04 ENCOUNTER — Telehealth: Payer: Self-pay | Admitting: Internal Medicine

## 2013-10-04 NOTE — Telephone Encounter (Signed)
Per Dr. Sherene Sires- ONO on 3lpm was normal, and pt should cont o2 at 3lpm with sleep LMTCB

## 2013-10-04 NOTE — Telephone Encounter (Signed)
Returning call.

## 2013-10-04 NOTE — Telephone Encounter (Signed)
lmomtcb x1 for pt 

## 2013-10-05 NOTE — Telephone Encounter (Signed)
Pt advised. [name], CMA  

## 2013-10-12 ENCOUNTER — Other Ambulatory Visit: Payer: Self-pay | Admitting: Urology

## 2013-10-17 ENCOUNTER — Other Ambulatory Visit (INDEPENDENT_AMBULATORY_CARE_PROVIDER_SITE_OTHER): Payer: Medicaid Other

## 2013-10-17 DIAGNOSIS — E785 Hyperlipidemia, unspecified: Secondary | ICD-10-CM

## 2013-10-17 LAB — HEPATIC FUNCTION PANEL
ALT: 12 U/L (ref 0–35)
Alkaline Phosphatase: 69 U/L (ref 39–117)
Bilirubin, Direct: 0.1 mg/dL (ref 0.0–0.3)
Total Bilirubin: 1.5 mg/dL — ABNORMAL HIGH (ref 0.3–1.2)
Total Protein: 7.6 g/dL (ref 6.0–8.3)

## 2013-10-17 LAB — LIPID PANEL
HDL: 52.8 mg/dL (ref >39.00)
VLDL: 24.2 mg/dL (ref 0.0–40.0)

## 2013-10-17 LAB — LDL CHOLESTEROL, DIRECT: Direct LDL: 162 mg/dL

## 2013-10-18 ENCOUNTER — Encounter (HOSPITAL_COMMUNITY): Payer: Self-pay | Admitting: Pharmacy Technician

## 2013-10-19 ENCOUNTER — Other Ambulatory Visit: Payer: Medicaid Other

## 2013-10-19 NOTE — Patient Instructions (Addendum)
18 [name]  10/19/2013   Your procedure is scheduled on: 10/25/13  Report to [facility] at 9:30 AM.  Call this number if you have problems the morning of surgery 336-: [phone]   Remember: please bring inhaler on the day of surgery    Do not eat food or drink liquids After Midnight.     Take these medicines the morning of surgery with A SIP OF WATER: Prilosec,Neurontin,Lexapro, use Albuterol inhaler and Symbicort inhaler and bring them with you to hospital   Do not wear jewelry, make-up or nail polish.  Do not wear lotions, powders, or perfumes. You may wear deodorant.  Do not shave 48 hours prior to surgery. Men may shave face and neck.  Do not bring valuables to the hospital.  Contacts, dentures or bridgework may not be worn into surgery.  Leave suitcase in the car. After surgery it may be brought to your room.  For patients admitted to the hospital, checkout time is 11:00 AM the day of discharge.   Patients discharged the day of surgery will not be allowed to drive home.  Name and phone number of your driver: SON (not sure of which one)  Special Instructions: n/a   Please read over the following fact sheets that you were given:  Merleen Nicely, RN  pre op nurse call if needed [phone]    FAILURE TO FOLLOW THESE INSTRUCTIONS MAY RESULT IN CANCELLATION OF YOUR SURGERY   Patient Signature: ___________________________________________

## 2013-10-19 NOTE — Progress Notes (Signed)
Chest x-ray 09/21/13 on [facility], EKG 09/26/13 on EPIC 

## 2013-10-20 ENCOUNTER — Encounter (HOSPITAL_COMMUNITY)
Admission: RE | Admit: 2013-10-20 | Discharge: 2013-10-20 | Disposition: A | Discharge status: Home / Self Care | Payer: Medicaid Other | Source: Ambulatory Visit | Attending: Urology | Admitting: Urology

## 2013-10-20 ENCOUNTER — Encounter (HOSPITAL_COMMUNITY): Payer: Self-pay

## 2013-10-20 ENCOUNTER — Ambulatory Visit (INDEPENDENT_AMBULATORY_CARE_PROVIDER_SITE_OTHER): Payer: Medicaid Other | Admitting: Pharmacist

## 2013-10-20 DIAGNOSIS — Z01812 Encounter for preprocedural laboratory examination: Secondary | ICD-10-CM | POA: Insufficient documentation

## 2013-10-20 DIAGNOSIS — E785 Hyperlipidemia, unspecified: Secondary | ICD-10-CM

## 2013-10-20 LAB — BASIC METABOLIC PANEL
BUN: 17 mg/dL (ref 6–23)
CO2: 32 mEq/L (ref 19–32)
Creatinine, Ser: 0.91 mg/dL (ref 0.50–1.10)
GFR calc non Af Amer: 70 mL/min — ABNORMAL LOW (ref >90)
Glucose, Bld: 156 mg/dL — ABNORMAL HIGH (ref 70–99)
Potassium: 4.1 mEq/L (ref 3.5–5.1)

## 2013-10-20 LAB — CBC
HCT: 37.9 % (ref 36.0–46.0)
Hemoglobin: 12 g/dL (ref 12.0–15.0)
MCH: 26.2 pg (ref 26.0–34.0)
MCHC: 31.7 g/dL (ref 30.0–36.0)
MCV: 82.8 fL (ref 78.0–100.0)
Platelets: 229 10*3/uL (ref 150–400)
RBC: 4.58 MIL/uL (ref 3.87–5.11)
RDW: 14.5 % (ref 11.5–15.5)
WBC: 6.8 10*3/uL (ref 4.0–10.5)

## 2013-10-20 NOTE — Assessment & Plan Note (Signed)
Pt's cholesterol increased since last visit.  LDL increased from 115 to 782.  LFTs are WNL.  This increase likely related to medication non-compliance.  Her LDL has been as low as 100 when compliant with Lipitor and Zetia.  Discussed options to improve compliance with patient.  Will have her work on this rather than add additional therapies at this time.  Will work on prior authorization for [employer] as well.  Recheck labs in 4 months.

## 2013-10-20 NOTE — Patient Instructions (Signed)
Great job on your weight loss and diet changes!   Try to set an alarm on your phone to remember to take your medications.   Continue to walk as much as possible.  Recheck labs in 4 months.

## 2013-10-20 NOTE — Progress Notes (Signed)
Ms Tison presents to clinic for follow up of her hyperlipidemia.  Her current cholesterol lowering medications include Lipitor 80mg  and Zetia 10mg  daily.  Since last visit, pt has had multiple hospitalizations.  She had a knee replacement in July and most recently had an episode of nephrolithiasis related to kidney stone.  She is scheduled for lithotripsy on 10/28.  She does report issues with remember to take her medications.  She will forget her medications 2-3 days of each week.  She states she forgets both morning and night time medications equally.  Only complaint today is some increase in muscle cramps in her legs since her knee replacements.  She notices this more when she moves her legs or feet a certain direction and at night.  Does not correlate any difference when she remembers to take her Lipitor and when she does not.    Diet: She reports eating smaller portions since last visit. She has started using a smaller plate and only getting 1 helping rather than multiples. She reports she has started noticing a difference in her appetite and food cravings, noting that she gets satisfied with the smaller portions now.  She has cut out all soft drinks and is drinking Kool-Aid and water  Decreased pre-packaged foods and is eating more frozen vegetables than canned.  Her weight is down 13 lbs since last visit in May.   Exercise: Pt states she tries to walk around the house but is not walking on regular basis at this time.   Current Outpatient Prescriptions on File Prior to Visit  Medication Sig Dispense Refill  . albuterol (PROVENTIL HFA;VENTOLIN HFA) 108 (90 BASE) MCG/ACT inhaler Inhale 2 puffs into the lungs every 4 (four) hours as needed for wheezing or shortness of breath (((PLAN B))).       Marland Kitchen albuterol (PROVENTIL) (2.5 MG/3ML) 0.083% nebulizer solution Take 2.5 mg by nebulization every 4 (four) hours as needed for wheezing or shortness of breath (((PLAN C))).       Marland Kitchen aspirin EC 325 MG tablet  Take 325 mg by mouth 2 (two) times daily.      Marland Kitchen atorvastatin (LIPITOR) 80 MG tablet Take 80 mg by mouth at bedtime.      . budesonide-formoterol (SYMBICORT) 160-4.5 MCG/ACT inhaler Inhale 2 puffs into the lungs 2 (two) times daily.      . clopidogrel (PLAVIX) 75 MG tablet Take 75 mg by mouth every morning.       . cyclobenzaprine (FLEXERIL) 10 MG tablet Take 10 mg by mouth every 8 (eight) hours as needed for muscle spasms.       Marland Kitchen dextromethorphan (DELSYM) 30 MG/5ML liquid Take 60 mg by mouth 2 (two) times daily as needed for cough.       . escitalopram (LEXAPRO) 20 MG tablet Take 20 mg by mouth every morning.       . ezetimibe (ZETIA) 10 MG tablet Take 10 mg by mouth every evening.      . famotidine (PEPCID) 20 MG tablet Take 20 mg by mouth at bedtime.      . furosemide (LASIX) 40 MG tablet Take 40 mg by mouth 2 (two) times daily.      Marland Kitchen gabapentin (NEURONTIN) 300 MG capsule Take 300 mg by mouth 2 (two) times daily.       Marland Kitchen glipiZIDE (GLUCOTROL) 5 MG tablet Take 2.5-5 mg by mouth 2 (two) times daily before a meal. 1/2 tab with morning meal, 1 whole tab with evening meal      .  hydrochlorothiazide (HYDRODIURIL) 25 MG tablet Take 25 mg by mouth every morning.       Marland Kitchen losartan (COZAAR) 100 MG tablet Take 100 mg by mouth daily with breakfast.       . MAGNESIUM PO Take 1-2 tablets by mouth as needed (constipation).      . Multiple Vitamin (MULTIVITAMIN) tablet Take 1 tablet by mouth daily.      Marland Kitchen omeprazole (PRILOSEC) 20 MG capsule Take 20 mg by mouth daily before breakfast.      . oxyCODONE-acetaminophen (PERCOCET/ROXICET) 5-325 MG per tablet Take 1-2 tablets by mouth every 4 (four) hours as needed for pain.      . potassium chloride SA (K-DUR,KLOR-CON) 20 MEQ tablet Take 40 mEq by mouth daily after breakfast.        No current facility-administered medications on file prior to visit.   No Known Allergies

## 2013-10-25 ENCOUNTER — Encounter (HOSPITAL_COMMUNITY): Payer: Self-pay | Admitting: *Deleted

## 2013-10-25 ENCOUNTER — Encounter: Payer: Self-pay | Admitting: Internal Medicine

## 2013-10-25 ENCOUNTER — Encounter (HOSPITAL_COMMUNITY)
Admission: RE | Disposition: A | Discharge status: Home / Self Care | Payer: Self-pay | Source: Ambulatory Visit | Attending: Urology

## 2013-10-25 ENCOUNTER — Ambulatory Visit (HOSPITAL_COMMUNITY): Payer: Medicaid Other | Admitting: Certified Registered Nurse Anesthetist

## 2013-10-25 ENCOUNTER — Encounter (HOSPITAL_COMMUNITY): Payer: Medicaid Other | Admitting: Certified Registered Nurse Anesthetist

## 2013-10-25 ENCOUNTER — Ambulatory Visit (HOSPITAL_COMMUNITY)
Admission: RE | Admit: 2013-10-25 | Discharge: 2013-10-25 | Disposition: A | Discharge status: Home / Self Care | Payer: Medicaid Other | Source: Ambulatory Visit | Attending: Urology | Admitting: Urology

## 2013-10-25 DIAGNOSIS — I252 Old myocardial infarction: Secondary | ICD-10-CM | POA: Insufficient documentation

## 2013-10-25 DIAGNOSIS — N200 Calculus of kidney: Secondary | ICD-10-CM | POA: Insufficient documentation

## 2013-10-25 DIAGNOSIS — I10 Essential (primary) hypertension: Secondary | ICD-10-CM | POA: Insufficient documentation

## 2013-10-25 DIAGNOSIS — E119 Type 2 diabetes mellitus without complications: Secondary | ICD-10-CM | POA: Insufficient documentation

## 2013-10-25 DIAGNOSIS — R310 Gross hematuria: Secondary | ICD-10-CM | POA: Insufficient documentation

## 2013-10-25 DIAGNOSIS — J449 Chronic obstructive pulmonary disease, unspecified: Secondary | ICD-10-CM | POA: Insufficient documentation

## 2013-10-25 DIAGNOSIS — Z7982 Long term (current) use of aspirin: Secondary | ICD-10-CM | POA: Insufficient documentation

## 2013-10-25 DIAGNOSIS — Z96659 Presence of unspecified artificial knee joint: Secondary | ICD-10-CM | POA: Insufficient documentation

## 2013-10-25 DIAGNOSIS — Z86711 Personal history of pulmonary embolism: Secondary | ICD-10-CM | POA: Insufficient documentation

## 2013-10-25 DIAGNOSIS — Z79899 Other long term (current) drug therapy: Secondary | ICD-10-CM | POA: Insufficient documentation

## 2013-10-25 DIAGNOSIS — I2510 Atherosclerotic heart disease of native coronary artery without angina pectoris: Secondary | ICD-10-CM | POA: Insufficient documentation

## 2013-10-25 DIAGNOSIS — Z7902 Long term (current) use of antithrombotics/antiplatelets: Secondary | ICD-10-CM | POA: Insufficient documentation

## 2013-10-25 DIAGNOSIS — H409 Unspecified glaucoma: Secondary | ICD-10-CM | POA: Insufficient documentation

## 2013-10-25 DIAGNOSIS — K219 Gastro-esophageal reflux disease without esophagitis: Secondary | ICD-10-CM | POA: Insufficient documentation

## 2013-10-25 DIAGNOSIS — E7800 Pure hypercholesterolemia, unspecified: Secondary | ICD-10-CM | POA: Insufficient documentation

## 2013-10-25 DIAGNOSIS — Z87891 Personal history of nicotine dependence: Secondary | ICD-10-CM | POA: Insufficient documentation

## 2013-10-25 DIAGNOSIS — J4489 Other specified chronic obstructive pulmonary disease: Secondary | ICD-10-CM | POA: Insufficient documentation

## 2013-10-25 HISTORY — PX: CYSTOSCOPY WITH URETEROSCOPY AND STENT PLACEMENT: SHX6377

## 2013-10-25 LAB — GLUCOSE, CAPILLARY: Glucose-Capillary: 130 mg/dL — ABNORMAL HIGH (ref 70–99)

## 2013-10-25 SURGERY — CYSTOURETEROSCOPY, WITH STENT INSERTION
Anesthesia: General | Laterality: Right | Wound class: Clean Contaminated

## 2013-10-25 MED ORDER — NEOSTIGMINE METHYLSULFATE 1 MG/ML IJ SOLN
INTRAMUSCULAR | Status: DC | PRN
  Administered 2013-10-25: 3 mg via INTRAVENOUS

## 2013-10-25 MED ORDER — FENTANYL CITRATE 0.05 MG/ML IJ SOLN
INTRAMUSCULAR | Status: DC | PRN
  Administered 2013-10-25: 100 ug via INTRAVENOUS
  Administered 2013-10-25 (×3): 50 ug via INTRAVENOUS

## 2013-10-25 MED ORDER — FENTANYL CITRATE 0.05 MG/ML IJ SOLN: 25.0000 ug | INTRAMUSCULAR | Status: DC | PRN

## 2013-10-25 MED ORDER — LIDOCAINE HCL (CARDIAC) 20 MG/ML IV SOLN
INTRAVENOUS | Status: DC | PRN
  Administered 2013-10-25: 50 mg via INTRAVENOUS

## 2013-10-25 MED ORDER — LIDOCAINE HCL 2 % EX GEL
CUTANEOUS | Status: DC | PRN
  Administered 2013-10-25: 1 via URETHRAL

## 2013-10-25 MED ORDER — SODIUM CHLORIDE 0.9 % IR SOLN
Status: DC | PRN
  Administered 2013-10-25: 2000 mL

## 2013-10-25 MED ORDER — CISATRACURIUM BESYLATE (PF) 10 MG/5ML IV SOLN
INTRAVENOUS | Status: DC | PRN
  Administered 2013-10-25: 6 mg via INTRAVENOUS
  Administered 2013-10-25: 2 mg via INTRAVENOUS

## 2013-10-25 MED ORDER — LACTATED RINGERS IV SOLN: INTRAVENOUS | Status: DC

## 2013-10-25 MED ORDER — CIPROFLOXACIN IN D5W 400 MG/200ML IV SOLN
400.0000 mg | INTRAVENOUS | Status: AC
  Administered 2013-10-25: 400 mg via INTRAVENOUS

## 2013-10-25 MED ORDER — MEPERIDINE HCL 50 MG/ML IJ SOLN: 6.2500 mg | INTRAMUSCULAR | Status: DC | PRN

## 2013-10-25 MED ORDER — OXYCODONE-ACETAMINOPHEN 5-325 MG PO TABS
1.0000 | ORAL_TABLET | ORAL | Status: DC | PRN
  Administered 2013-10-25: 1 via ORAL
  Filled 2013-10-25: qty 1

## 2013-10-25 MED ORDER — MIDAZOLAM HCL 5 MG/5ML IJ SOLN
INTRAMUSCULAR | Status: DC | PRN
  Administered 2013-10-25: 2 mg via INTRAVENOUS

## 2013-10-25 MED ORDER — BELLADONNA ALKALOIDS-OPIUM 16.2-60 MG RE SUPP
RECTAL | Status: AC
  Filled 2013-10-25: qty 1

## 2013-10-25 MED ORDER — ONDANSETRON HCL 4 MG/2ML IJ SOLN
INTRAMUSCULAR | Status: DC | PRN
  Administered 2013-10-25: 4 mg via INTRAVENOUS

## 2013-10-25 MED ORDER — SODIUM CHLORIDE 0.9 % IR SOLN
Status: DC | PRN
  Administered 2013-10-25: 3000 mL

## 2013-10-25 MED ORDER — GLYCOPYRROLATE 0.2 MG/ML IJ SOLN
INTRAMUSCULAR | Status: DC | PRN
  Administered 2013-10-25: 0.6 mg via INTRAVENOUS

## 2013-10-25 MED ORDER — LIDOCAINE HCL 2 % EX GEL
CUTANEOUS | Status: AC
  Filled 2013-10-25: qty 10

## 2013-10-25 MED ORDER — SUCCINYLCHOLINE CHLORIDE 20 MG/ML IJ SOLN
INTRAMUSCULAR | Status: DC | PRN
  Administered 2013-10-25: 100 mg via INTRAVENOUS

## 2013-10-25 MED ORDER — CIPROFLOXACIN IN D5W 400 MG/200ML IV SOLN
INTRAVENOUS | Status: AC
  Filled 2013-10-25: qty 200

## 2013-10-25 MED ORDER — LACTATED RINGERS IV SOLN
INTRAVENOUS | Status: DC | PRN
  Administered 2013-10-25 (×2): via INTRAVENOUS

## 2013-10-25 MED ORDER — PROMETHAZINE HCL 25 MG/ML IJ SOLN: 6.2500 mg | INTRAMUSCULAR | Status: DC | PRN

## 2013-10-25 MED ORDER — LACTATED RINGERS IV SOLN
INTRAVENOUS | Status: DC
  Administered 2013-10-25: 1000 mL via INTRAVENOUS

## 2013-10-25 MED ORDER — PROPOFOL 10 MG/ML IV BOLUS
INTRAVENOUS | Status: DC | PRN
  Administered 2013-10-25: 150 mg via INTRAVENOUS
  Administered 2013-10-25: 50 mg via INTRAVENOUS

## 2013-10-25 SURGICAL SUPPLY — 19 items
BAG URO CATCHER STRL LF (DRAPE) ×2 IMPLANT
BASKET LASER NITINOL 1.9FR (BASKET) ×2 IMPLANT
CATH INTERMIT  6FR 70CM (CATHETERS) IMPLANT
CATH URET 5FR 28IN CONE TIP (BALLOONS)
CATH URET 5FR 28IN OPEN ENDED (CATHETERS) ×2 IMPLANT
CATH URET 5FR 70CM CONE TIP (BALLOONS) IMPLANT
CLOTH BEACON ORANGE TIMEOUT ST (SAFETY) ×2 IMPLANT
CONT SPEC 4OZ CLIKSEAL STRL BL (MISCELLANEOUS) IMPLANT
DRAPE CAMERA CLOSED 9X96 (DRAPES) ×4 IMPLANT
FIBER LASER FLEXIVA 200 (UROLOGICAL SUPPLIES) ×2 IMPLANT
GLOVE BIO SURGEON STRL SZ7.5 (GLOVE) ×2 IMPLANT
GLOVE BIOGEL M STRL SZ7.5 (GLOVE) ×2 IMPLANT
GOWN STRL REIN XL XLG (GOWN DISPOSABLE) ×4 IMPLANT
GUIDEWIRE STR DUAL SENSOR (WIRE) ×4 IMPLANT
MANIFOLD NEPTUNE II (INSTRUMENTS) ×2 IMPLANT
PACK CYSTO (CUSTOM PROCEDURE TRAY) ×2 IMPLANT
STENT CONTOUR 6FRX24X.038 (STENTS) ×2 IMPLANT
TUBING CONNECTING 10 (TUBING) ×2 IMPLANT
WIRE COONS/BENSON .038X145CM (WIRE) IMPLANT

## 2013-10-25 NOTE — H&P (Signed)
F/u nephrolithiasis, gross hematuria. Referred by Dr. Kathlen Mody, PCP is Dr. Della Goo  1- nephrolithiasis - She has a remote h/o passing a kidney stone.  -Feb 2013 - punctate left renal calculus and a 4 x 8 mm right lower pole stone  -Sept 2014 - seen in hospital right UPJ stone, underwent cystoscopy stent. Escherichia coli and urine sensitive to Cipro, levofloxacin, gent, nitrofurantoin, Bactrim.  2- gross hematuria - She is a past smoker with a 45 yr pack history. She has not been exposed to chemo, XRT or dye/solvents. -February 2013 labs: BUN 13, creatinine 0.9 ; CT non-contrast February 2013 which revealed a punctate left renal calculus and a 4 x 8 mm right lower pole stone. There were no other stones or hydronephrosis. - Mar 2013 developed small subsegmental right upper lobe pulmonary emboli and multifocal pneumonia following a right knee arthroplasty. She was started on Lovenox and coumadin and developed gross hematuria with small clots and red urine. No LUTS. Cystoscopy in office was negative.     Oct 2014 Interval Hx -  Patient was seen as a hospital consult by Dr. Patsi Sears after she developed right flank pain and subsequently underwent cystoscopy right ureteral stent by me September 2014 for a 9 mm right UPJ stone.  She was discharged on Cipro. She has had no fever or chills. She stopped abx after 13 days but still has a few. No dysuria or fever. Has some frequency, urgency and mild hematuria probably stent related.   She is on Plavix and aspirin.    Past Medical History Problems  1. History of  Acute Myocardial Infarction V12.59 2. History of  Adult Sleep Apnea 780.57 3. History of  Aortic Stenosis 424.1 4. History of  Arthritis V13.4 5. History of  Asthma 493.90 6. History of  Carpal Tunnel Syndrome 354.0 7. History of  Coronary Artery Disease V12.59 8. History of  Depression 311 9. History of  Diabetes Mellitus 250.00 10. History of  Esophageal Reflux  530.81 11. History of  Glaucoma 365.9 12. History of  Hepatitis 573.3 13. History of  Hypercholesterolemia 272.0 14. History of  Hypertension 401.9 15. History of  Morbid Obesity 278.01 16. History of  Pulmonary Embolism V12.55  Surgical History Problems  1. History of  Cath Stent Placement 2. History of  Cholecystectomy 3. History of  Cystoscopy With Insertion Of Ureteral Stent Right 4. History of  Knee Arthroscopy 5. History of  Knee Replacement Right 6. History of  Knee Replacement Right 7. History of  PTCA 8. History of  Tubal Ligation V25.2  Current Meds 1. Aspirin 325 MG Oral Tablet; Therapy: (Recorded:13Oct2014) to 2. Atorvastatin Calcium 80 MG Oral Tablet; Therapy: (Recorded:13Oct2014) to 3. Ferrous Sulfate TABS; Therapy: (Recorded:13Oct2014) to 4. Furosemide 40 MG Oral Tablet; Therapy: (Recorded:13Oct2014) to 5. GlipiZIDE ER 5 MG Oral Tablet Extended Release 24 Hour; Therapy:  (Recorded:13Oct2014) to 6. Hydrochlorothiazide 25 MG Oral Tablet; Therapy: (Recorded:13Oct2014) to 7. Klor-Con M20 20 MEQ Oral Tablet Extended Release; Therapy: (Recorded:13Oct2014) to 8. Lexapro 20 MG Oral Tablet; Therapy: (Recorded:13Oct2014) to 9. Losartan Potassium 100 MG Oral Tablet; Therapy: (Recorded:13Oct2014) to 10. Multi-Vitamin TABS; Therapy: (Recorded:13Oct2014) to 11. Neurontin 300 MG Oral Capsule; Therapy: (Recorded:13Oct2014) to 12. Oxycodone-Acetaminophen 5-325 MG Oral Tablet; Therapy: (Recorded:13Oct2014) to 13. Oxygen Use; Therapy: (Recorded:13Oct2014) to 14. Plavix 75 MG Oral Tablet; Therapy: (Recorded:13Oct2014) to 15. PriLOSEC 20 MG Oral Capsule Delayed Release; Therapy: (Recorded:13Oct2014) to 16. Symbicort 80-4.5 MCG/ACT Inhalation Aerosol; Therapy: (Recorded:13Oct2014) to 17. Zetia 10 MG  Oral Tablet; Therapy: (Recorded:13Oct2014) to  Allergies Medication  1. Hydrocodone-Acetaminophen CAPS  Family History Problems  1. Maternal history of  Breast Cancer V16.3 2.  Family history of  Death In The Family Father natural causes 3. Family history of  Death In The Family Mother breast cancer 4. Family history of  Family Health Status Number Of Children 2 sons  Social History Problems  1. Caffeine Use 2. Occupation: Disabled 3. Tobacco Use 305.1 smoked x 34 years Denied  4. History of  Alcohol Use  Review of Systems Constitutional, cardiovascular, gastrointestinal and neurological system(s) were reviewed and pertinent findings if present are noted.  Respiratory: shortness of breath.  stable - no worse, sleeps with O2    Vitals Vital Signs [Data Includes: Last 1 Day]  13Oct2014 10:17AM  BMI Calculated: 56.88 BSA Calculated: 2.36 Height: 5 ft 3 in Weight: 321 lb  Blood Pressure: 150 / 88 Temperature: 97.5 F Heart Rate: 89  Physical Exam Constitutional: Well nourished and well developed . No acute distress.  Pulmonary: No respiratory distress and normal respiratory rhythm and effort.  Cardiovascular: Heart rate and rhythm are normal . No peripheral edema.  Neuro/Psych:. Mood and affect are appropriate.    Results/Data Urine [Data Includes: Last 1 Day]   13Oct2014  COLOR YELLOW   APPEARANCE CLOUDY   SPECIFIC GRAVITY 1.025   pH 6.0   GLUCOSE NEG mg/dL  BILIRUBIN NEG   KETONE NEG mg/dL  BLOOD LARGE   PROTEIN 30 mg/dL  UROBILINOGEN 0.2 mg/dL  NITRITE NEG   LEUKOCYTE ESTERASE MOD   SQUAMOUS EPITHELIAL/HPF MODERATE   WBC 7-10 WBC/hpf  RBC TNTC RBC/hpf  BACTERIA MODERATE   CRYSTALS NONE SEEN   CASTS NONE SEEN    Assessment Assessed  1. Ureteral Stone 592.1  Plan Health Maintenance (V70.0)  1. UA With REFLEX  Done: 13Oct2014 10:04AM Ureteral Stone (592.1)  2. URINE CULTURE  Requested for: 13Oct2014 3. Follow-up Schedule Surgery Office  Follow-up  Requested for: 13Oct2014  Discussion/Summary     Discussed management of renal / ureteral stones on right and nature, R/B of removing stent today/stone passage/surveillance,  ureteroscopy, ESWL. All questions answered. Given her large size, plavix use and stent , ureteroscopy is a good option and she elects to proceed. Urine sent for Cx as a precaution.      Signatures Electronically signed by : Jerilee Field, M.D.; Oct 10 2013 10:52AM  Add: urine cx grew enterococcus - instructions to start NF three days pre-op given to patient.

## 2013-10-25 NOTE — Interval H&P Note (Signed)
History and Physical Interval Note:  10/25/2013 10:58 AM  [name]  has presented today for surgery, with the diagnosis of RIGHT RENAL AND URETERAL STONE   The various methods of treatment have been discussed with the patient and family. After consideration of risks, benefits and other options for treatment, the patient has consented to  Procedure(s): CYSTOSCOPY RIGHT URETEROSCOPY HOLMIUM LASER LITHO AND STENT PLACEMENT (Right) HOLMIUM LASER APPLICATION (Right) as a surgical intervention .  The patient's history has been reviewed, patient examined, no change in status, stable for surgery. Pt started abx three days ago as instructed. No fever or dysuria.  I have reviewed the patient's chart and labs.  Questions were answered to the patient's satisfaction.     Antony Haste

## 2013-10-25 NOTE — Progress Notes (Signed)
Phase 2 Short Stay post op. Report on this patient given to Montel Clock RN by me. Pt lying quietly on left side after being medicated for pain

## 2013-10-25 NOTE — Anesthesia Procedure Notes (Signed)
Procedure Name: Intubation Date/Time: 10/25/2013 11:50 AM Performed by: Hulan Fess Pre-anesthesia Checklist: Patient identified, Emergency Drugs available, Suction available, Patient being monitored and Timeout performed Patient Re-evaluated:Patient Re-evaluated prior to inductionOxygen Delivery Method: Circle system utilized Preoxygenation: Pre-oxygenation with 100% oxygen Intubation Type: IV induction Ventilation: Mask ventilation with difficulty Laryngoscope Size: Mac and 3 Grade View: Grade I Tube type: Oral Tube size: 8.0 mm Number of attempts: 1 Placement Confirmation: ETT inserted through vocal cords under direct vision and positive ETCO2 Secured at: 22 cm Tube secured with: Tape Dental Injury: Teeth and Oropharynx as per pre-operative assessment

## 2013-10-25 NOTE — Progress Notes (Signed)
Phase 2 Short Stay post op. Pain has increased from 4 to 6 /10 right flank area.Placed call to Dr Mena Goes and orders received. Pt had ambulated to BR with assist and unable to void at this time. Returned to stretcher and lying flat

## 2013-10-25 NOTE — Anesthesia Preprocedure Evaluation (Signed)
Anesthesia Evaluation   Patient awake    History of Anesthesia Complications Negative for: history of anesthetic complications  Airway Mallampati: III TM Distance: >3 FB Neck ROM: Limited    Dental  (+) Teeth Intact, Chipped and Dental Advisory Given   Pulmonary shortness of breath and with exertion, asthma , sleep apnea , pneumonia -, COPD oxygen dependent, former smoker,  breath sounds clear to auscultation  Pulmonary exam normal       Cardiovascular hypertension, Pt. on medications + CAD, + Past MI and + Cardiac Stents + Valvular Problems/Murmurs AS Rhythm:Regular Rate:Normal  Low risk Lexiscan with EF 65%   Neuro/Psych  Headaches, Depression    GI/Hepatic GERD-  ,  Endo/Other  diabetes, Poorly Controlled, Type 2, Oral Hypoglycemic AgentsMorbid obesity  Renal/GU Renal disease     Musculoskeletal negative musculoskeletal ROS (+)   Abdominal (+) + obese,   Peds  Hematology negative hematology ROS (+)   Anesthesia Other Findings   Reproductive/Obstetrics                           Anesthesia Physical  Anesthesia Plan  ASA: III and emergent  Anesthesia Plan: General   Post-op Pain Management:    Induction: Intravenous  Airway Management Planned: Oral ETT  Additional Equipment:   Intra-op Plan:   Post-operative Plan: Extubation in OR  Informed Consent: I have reviewed the patients History and Physical, chart, labs and discussed the procedure including the risks, benefits and alternatives for the proposed anesthesia with the patient or authorized representative who has indicated his/her understanding and acceptance.   Dental advisory given  Plan Discussed with: CRNA  Anesthesia Plan Comments:         Anesthesia Quick Evaluation

## 2013-10-25 NOTE — Anesthesia Postprocedure Evaluation (Signed)
  Anesthesia Post-op Note  Patient: [name]  Procedure(s) Performed: Procedure(s) (LRB): CYSTOSCOPY RIGHT URETEROSCOPY HOLMIUM LASER LITHO AND STENT PLACEMENT (Right)  Patient Location: PACU  Anesthesia Type: General  Level of Consciousness: awake and alert   Airway and Oxygen Therapy: Patient Spontanous Breathing  Post-op Pain: mild  Post-op Assessment: Post-op Vital signs reviewed, Patient's Cardiovascular Status Stable, Respiratory Function Stable, Patent Airway and No signs of Nausea or vomiting  Last Vitals:  Filed Vitals:   10/25/13 1349  BP: 160/90  Pulse: 84  Temp: 36.7 C  Resp:     Post-op Vital Signs: stable   Complications: No apparent anesthesia complications

## 2013-10-25 NOTE — Op Note (Signed)
Preoperative diagnosis: Right ureteral stone Postop diagnosis: Right renal stone  Procedure: Cystoscopy right ureteroscopy holmium laser lithotripsy stone basket extraction right ureteral stent placement  Surgeon: Mena Goes Anesthesia: [place]  Type Of anesthesia: Gen.   findings: The bladder mucosa appeared normal without tumor. There was no stone or foreign body in the bladder.  On right ureteroscopy there was a large stone in the right lower pole which was relocated to the right upper pole and fragmented. The ureter was inspected in its entirety after the sheath was removed and noted to be normal without stone or injury.  Description of procedure: After consent was obtained patient brought to the operating room. After adequate anesthesia she was placed in lithotomy position prepped and draped in the usual sterile fashion. A timeout was performed to confirm the patient and procedure.  On scout imaging a right ureteral stent was noted in good position and a density in the right lower pole consistent with the prior stone in the UPJ.  A cystoscope was advanced per urethra and the right ureteral stent was grasped and removed through the urethral meatus without difficulty. A sensor wire was advanced and coiled in the collecting system. The stent was removed and discarded. The cystoscope passed per urethra again and a second wire passed up the ureter. Over the second wire a ureteral access sheath 12/14 was advanced without difficulty.  The ureteroscope was passed into the kidney in the collecting system carefully inspected in the stone was noted in the in the lower pole. There was quite a torque and flex on the scope therefore I planned to move the stone into the upper pole. As the scope was straightened it push the stone up toward the upper pole. The stone was then driven in to the upper pole calyx without difficulty. Reinspection of the midpole and lower pole collecting system noted there to be no  other stones. A 200  holmium laser fiber was advanced and at a setting of 0.8 end 8 the stone was broken into smaller pieces. An escape basket was used to remove the large pieces. There was many clinically insignificant pieces as the outside of the stone was quite soft and simply broken apart with stone manipulation. The inner core of the stone was quite hard and all these fragments were removed. There were no other clinically significant fragments that these basket to grasp. I went ahead and inspected the middle pole and lower pole collecting system a third time and again no other fragments found. Therefore the sheath was backed out on the ureteroscope and the UPJ inspected and noted to be normal. The entire ureter was inspected and noted to be normal and stone free.  The wire was backloaded on the cystoscope and a 6 x 24 cm stent was placed. The wire was removed and a good coil seen in the kidney and a good coil in the bladder. The cystoscope was removed leaving the string and stent in place. The patient was awakened and taken to the recovery room in stable condition.  Complications: None Blood loss: Minimal  Specimens: Stone fragments to office lab   Drains: Right ureteral stent with string.

## 2013-10-25 NOTE — Transfer of Care (Signed)
Immediate Anesthesia Transfer of Care Note  Patient: [name]  Procedure(s) Performed: Procedure(s): CYSTOSCOPY RIGHT URETEROSCOPY HOLMIUM LASER LITHO AND STENT PLACEMENT (Right)  Patient Location: PACU  Anesthesia Type:General  Level of Consciousness: awake and alert   Airway & Oxygen Therapy: Patient Spontanous Breathing and Patient connected to face mask oxygen  Post-op Assessment: Report given to PACU RN  Post vital signs: Reviewed and stable  Complications: No apparent anesthesia complications

## 2013-10-25 NOTE — Progress Notes (Signed)
Phase 2 Short Stay post op. Arrived from PACU and immediately had to go to BR. Able to void only small amount then back to stretcher. C/o of right flan pressure acing

## 2013-10-26 ENCOUNTER — Encounter (HOSPITAL_COMMUNITY): Payer: Self-pay | Admitting: Urology

## 2013-11-03 ENCOUNTER — Telehealth: Payer: Self-pay | Admitting: *Deleted

## 2013-11-03 ENCOUNTER — Other Ambulatory Visit: Payer: Self-pay

## 2013-11-08 NOTE — Telephone Encounter (Signed)
Spoke with [place] [employer] yesterday.  Sent requested information for PA approval.  Medicaid office stated that it would take 24-48 hours for approval.  Attempted to reach pt but line busy.

## 2013-11-11 MED ORDER — EZETIMIBE 10 MG PO TABS: 10.0000 mg | ORAL_TABLET | Freq: Every evening | ORAL | Status: DC

## 2013-11-11 NOTE — Telephone Encounter (Signed)
Called pt's pharmacy.  Zetia went through for $3 copay.  Pt aware she can pick up prescription at her convenience.

## 2014-02-14 ENCOUNTER — Other Ambulatory Visit: Payer: Self-pay | Admitting: Cardiovascular Disease

## 2014-02-17 ENCOUNTER — Other Ambulatory Visit: Payer: Medicaid Other

## 2014-02-24 ENCOUNTER — Ambulatory Visit: Payer: Medicaid Other | Admitting: Pharmacist

## 2014-02-24 ENCOUNTER — Other Ambulatory Visit: Payer: Self-pay | Admitting: Cardiovascular Disease

## 2014-03-01 ENCOUNTER — Ambulatory Visit (INDEPENDENT_AMBULATORY_CARE_PROVIDER_SITE_OTHER): Payer: Medicaid Other | Admitting: *Deleted

## 2014-03-01 DIAGNOSIS — E785 Hyperlipidemia, unspecified: Secondary | ICD-10-CM

## 2014-03-01 LAB — LIPID PANEL
CHOLESTEROL: 191 mg/dL (ref 0–200)
HDL: 53.5 mg/dL (ref >39.00)
LDL Cholesterol: 123 mg/dL — ABNORMAL HIGH (ref 0–99)
TRIGLYCERIDES: 71 mg/dL (ref 0.0–149.0)
Total CHOL/HDL Ratio: 4
VLDL: 14.2 mg/dL (ref 0.0–40.0)

## 2014-03-01 LAB — HEPATIC FUNCTION PANEL
ALBUMIN: 3.5 g/dL (ref 3.5–5.2)
ALT: 16 U/L (ref 0–35)
AST: 15 U/L (ref 0–37)
Alkaline Phosphatase: 71 U/L (ref 39–117)
BILIRUBIN DIRECT: 0.2 mg/dL (ref 0.0–0.3)
TOTAL PROTEIN: 7.3 g/dL (ref 6.0–8.3)
Total Bilirubin: 1.5 mg/dL — ABNORMAL HIGH (ref 0.3–1.2)

## 2014-03-06 ENCOUNTER — Ambulatory Visit (INDEPENDENT_AMBULATORY_CARE_PROVIDER_SITE_OTHER): Payer: Medicaid Other | Admitting: Pharmacist

## 2014-03-06 VITALS — Ht 63.0 in | Wt 343.4 lb

## 2014-03-06 DIAGNOSIS — E785 Hyperlipidemia, unspecified: Secondary | ICD-10-CM

## 2014-03-06 DIAGNOSIS — E876 Hypokalemia: Secondary | ICD-10-CM

## 2014-03-06 LAB — BASIC METABOLIC PANEL
BUN: 21 mg/dL (ref 6–23)
CALCIUM: 9.1 mg/dL (ref 8.4–10.5)
CO2: 35 mEq/L — ABNORMAL HIGH (ref 19–32)
CREATININE: 1.1 mg/dL (ref 0.4–1.2)
Chloride: 94 mEq/L — ABNORMAL LOW (ref 96–112)
GFR: 57.3 mL/min — ABNORMAL LOW (ref >60.00)
GLUCOSE: 180 mg/dL — AB (ref 70–99)
Potassium: 3.3 mEq/L — ABNORMAL LOW (ref 3.5–5.1)
Sodium: 138 mEq/L (ref 135–145)

## 2014-03-06 NOTE — Progress Notes (Signed)
[name] presents to clinic today for a follow up of hyperlipidemia. Current cholesterol lowering meds include atorvastatin 80mg  daily and Zetia 10mg  daily. Her adherence has improved, she now reports missing 2-3 doses in the last month. However, due to poor diet and exercise, pt has gained 23lbs since last visit. Current weight is 343lb,; her goal weight is <300lbs. Today, pt c/o of leg cramps. She reports not taking potassium for the past month because she has no refills remaining.  Diet: Due to holidays, cold weather, and recent deaths in the family, she has not been doing as well with her diet. Previously, she had been doing well decreasing portion sizes but has started eating more. Typical breakfast: usually cereal or nothing, occasionally bacon (3 strips), eggs (2-3) and toast (2 slices). Typical lunch: sandwich (cheese, chicken salad, doesn't like sandwich meat), bowl of soup, cup of noodles; usually has a serving of fruit (banana, apple, canned fruit); occasionally fast food (burger, subway). Typical Dinner: chicken and dumplings, baked chicken, steak, plus vegetables (medley, green beans), rarely rice/bread. She reports half of her portions being meat, half vegetables.   Exercise:  Pt states she has not been exercising. She is limited by shortness of breath; if she does walk, it is laps around her house or when she is out running errands.

## 2014-03-06 NOTE — Patient Instructions (Signed)
Thanks for coming in today, it was good to see you.  Goals: - Continue controlling portion sizes, slowly work your way to 1/4 of your plate meat/protein, 1/4 carbohydrates, 1/2 vegetables, fruit - Set aside 11am-noon as your exercise time (walking 20-30 minutes each day) - Reduce missed doses  Keep up the good work. We will follow up in 4 months.

## 2014-03-06 NOTE — Assessment & Plan Note (Signed)
Patient c/o today of muscle cramps and reports not having taken potassium supplements in last month d/t lack of refills. I have ordered a BMET today to confirm levels before refilling her potassium.

## 2014-03-06 NOTE — Assessment & Plan Note (Addendum)
Pt's lipid panel is improved since last visit. LDL has decreased to 123 from 162. LFTs WNL. Decrease is likely due to improved adherence to Lipitor and Zetia. Her LDL has been as low as 100 when compliant with medications and diet. Today we focused on improving diet and exercise and reinforced adherence strategies for further LDL reduction. Encouraged smaller portion sizes, increasing vegetables as she decreases meat portions. Also encouraged exercise; goal is to walk around her house for at least 30 min, 5 times per week, increasing total time as endurance improves. Set 11AM-12PM as a designated exercise time. Recheck labs in 4 months.   Patient seen with Sherie Don, PharmD student

## 2014-03-08 ENCOUNTER — Telehealth: Payer: Self-pay | Admitting: Pharmacist

## 2014-03-08 MED ORDER — POTASSIUM CHLORIDE CRYS ER 20 MEQ PO TBCR: 40.0000 meq | EXTENDED_RELEASE_TABLET | Freq: Every day | ORAL | Status: DC

## 2014-03-08 NOTE — Telephone Encounter (Signed)
Pt called to follow up on lab results from Monday.  Checked BMET to look at potassium before refilling since pt had not taken in ~ 1 month.  K was low at 3.3.  Instructed pt will need to restart taking potassium.  Rx sent to pharmacy.

## 2014-03-10 NOTE — Progress Notes (Signed)
Assessment and plan reviewed with Elberta Leatherwood, PharmD, CPP

## 2014-03-24 ENCOUNTER — Other Ambulatory Visit: Payer: Self-pay | Admitting: Cardiovascular Disease

## 2014-04-21 ENCOUNTER — Telehealth: Payer: Self-pay | Admitting: Cardiovascular Disease

## 2014-04-21 NOTE — Telephone Encounter (Signed)
Pt states that Dr. William Dalton at the [facility] wants to order Belviqib for weigh loss. MD would like for DR. Mcalhany to okay this medication before he can order it. Pt is aware that I will send this message to Dr. Angelena Form for recommendations.

## 2014-04-21 NOTE — Telephone Encounter (Signed)
New message     Want to take belviqib for weight loss----[facility] will not prescibe it until cardiologist says it is ok.  Please call Dr William Dalton [facility] to [facility] medication.  Pt did not know phone number

## 2014-04-24 ENCOUNTER — Telehealth: Payer: Self-pay | Admitting: Cardiovascular Disease

## 2014-04-24 NOTE — Telephone Encounter (Signed)
Patient is wanting to know if it is okay for her to take Belvic ( diet medication) to help her lose weight. Please call [phone] and advise.

## 2014-04-24 NOTE — Telephone Encounter (Signed)
Belviq has some association with possible heart valve problems. It should not affect her coronary artery disease or cause arrythmias but I am not familiar with this drug nor do I prescribe it. From a heart standpoint, I can only tell her what I have read about it. Can we check with Alferd Apa and see what he thinks? Thanks, Gerald Stabs

## 2014-04-24 NOTE — Telephone Encounter (Signed)
Routed to [employer]

## 2014-04-24 NOTE — Telephone Encounter (Signed)
See my response in other phone note. cdm

## 2014-04-24 NOTE — Telephone Encounter (Signed)
Returned call to patient she wanted to know if ok to take new diet medication Belvic.Message sent to [place] for advice.

## 2014-04-25 NOTE — Telephone Encounter (Signed)
Thanks Sebastian Ache, Can we let her know this? Thanks, chris

## 2014-04-25 NOTE — Telephone Encounter (Signed)
Spoke with pt and gave her information.

## 2014-04-25 NOTE — Telephone Encounter (Signed)
You are correct in that there is no absolute contraindication for Belviq and CAD, but hasn't been studied extensively in patients with heart disease.  Should be avoided if valve disease.  I don't think cardiology should be the one prescribing this, and given its lack of data in patients with heart disease it should be used cautiously.  If patient is going to use it, and hasn't lost at least 5% of her body weight by 12 weeks, she should stop the medication.

## 2014-04-25 NOTE — Telephone Encounter (Signed)
Pt with aortic stenosis and therefore should not take this medication. I placed call to pt to give her this information. Left message to call back

## 2014-04-26 ENCOUNTER — Other Ambulatory Visit: Payer: Self-pay | Admitting: Cardiovascular Disease

## 2014-06-03 ENCOUNTER — Other Ambulatory Visit: Payer: Self-pay | Admitting: Cardiovascular Disease

## 2014-06-20 ENCOUNTER — Telehealth: Payer: Self-pay | Admitting: Cardiovascular Disease

## 2014-06-20 NOTE — Telephone Encounter (Signed)
Received request from Nurse fax box, documents faxed for surgical clearance. To: [facility] Fax number: [phone] Attention: [phone]/km

## 2014-06-27 ENCOUNTER — Telehealth: Payer: Self-pay | Admitting: Cardiovascular Disease

## 2014-06-27 NOTE — Telephone Encounter (Signed)
This was faxed on June 20, 2014 by our medical records department. I placed two calls to [facility] to confirm fax number and was disconnected both times.  I confirmed our phones were working properly.

## 2014-06-27 NOTE — Telephone Encounter (Signed)
°  New problem:                                      [name] at Dr. Amedeo Plenty office and pt needs clearance to have colonoscopy 7/13  She will need to hold Plaxis 7 days prior to procedure.  Please Fax to [phone]

## 2014-06-27 NOTE — Telephone Encounter (Signed)
I spoke with [name] at [facility] and confirmed [phone] is correct number to fax clearance. Will refax.

## 2014-07-06 ENCOUNTER — Other Ambulatory Visit (INDEPENDENT_AMBULATORY_CARE_PROVIDER_SITE_OTHER): Payer: Medicaid Other

## 2014-07-06 ENCOUNTER — Ambulatory Visit: Payer: Medicaid Other | Admitting: Pharmacist

## 2014-07-06 DIAGNOSIS — E785 Hyperlipidemia, unspecified: Secondary | ICD-10-CM

## 2014-07-06 LAB — LIPID PANEL
Cholesterol: 191 mg/dL (ref 0–200)
HDL: 53.5 mg/dL (ref >39.00)
LDL Cholesterol: 116 mg/dL — ABNORMAL HIGH (ref 0–99)
NonHDL: 137.5
Total CHOL/HDL Ratio: 4
Triglycerides: 106 mg/dL (ref 0.0–149.0)
VLDL: 21.2 mg/dL (ref 0.0–40.0)

## 2014-07-06 LAB — HEPATIC FUNCTION PANEL
ALT: 14 U/L (ref 0–35)
AST: 15 U/L (ref 0–37)
Albumin: 3.6 g/dL (ref 3.5–5.2)
Alkaline Phosphatase: 73 U/L (ref 39–117)
BILIRUBIN TOTAL: 0.9 mg/dL (ref 0.2–1.2)
Bilirubin, Direct: 0 mg/dL (ref 0.0–0.3)
Total Protein: 7.3 g/dL (ref 6.0–8.3)

## 2014-07-13 ENCOUNTER — Ambulatory Visit (INDEPENDENT_AMBULATORY_CARE_PROVIDER_SITE_OTHER): Payer: Medicaid Other | Admitting: Pharmacist

## 2014-07-13 ENCOUNTER — Ambulatory Visit: Payer: Medicaid Other | Admitting: Pharmacist

## 2014-07-13 VITALS — Wt 355.0 lb

## 2014-07-13 DIAGNOSIS — E785 Hyperlipidemia, unspecified: Secondary | ICD-10-CM

## 2014-07-13 NOTE — Patient Instructions (Signed)
Hold your Lipitor for a week to see if your leg cramps get better.  Call Gay Filler at [phone] to let her know how you feel after the week.

## 2014-07-14 NOTE — Progress Notes (Signed)
[name] presents to clinic today for a follow up of hyperlipidemia. Current cholesterol lowering meds include atorvastatin 80mg  daily and Zetia 10mg  daily. Her adherence has improved.  However, due to poor diet and exercise, pt has gained another 12lbs since last visit. Current weight is 355lb.  She has started to use oxygen at home and long trips out of the house.Today, pt c/o of leg cramps. States this has not improved since restarting her potassium.  She states they are bad enough that they wake her up every night.   Her diet and exercise are unchanged since last visit.   Current Outpatient Prescriptions  Medication Sig Dispense Refill  . albuterol (PROVENTIL HFA;VENTOLIN HFA) 108 (90 BASE) MCG/ACT inhaler Inhale 2 puffs into the lungs every 4 (four) hours as needed for wheezing or shortness of breath (((PLAN B))).       Marland Kitchen albuterol (PROVENTIL) (2.5 MG/3ML) 0.083% nebulizer solution Take 2.5 mg by nebulization every 4 (four) hours as needed for wheezing or shortness of breath (((PLAN C))).       Marland Kitchen aspirin EC 325 MG tablet Take 325 mg by mouth 2 (two) times daily.      Marland Kitchen atorvastatin (LIPITOR) 80 MG tablet TAKE ONE TABLET BY MOUTH ONCE DAILY AT BEDTIME  30 tablet  0  . budesonide-formoterol (SYMBICORT) 160-4.5 MCG/ACT inhaler Inhale 2 puffs into the lungs 2 (two) times daily.      . clopidogrel (PLAVIX) 75 MG tablet Take 75 mg by mouth every morning.       . escitalopram (LEXAPRO) 20 MG tablet Take 20 mg by mouth every morning.       . ezetimibe (ZETIA) 10 MG tablet Take 1 tablet (10 mg total) by mouth every evening.  30 tablet  6  . furosemide (LASIX) 40 MG tablet Take 40 mg by mouth 2 (two) times daily.      Marland Kitchen gabapentin (NEURONTIN) 300 MG capsule Take 300 mg by mouth 2 (two) times daily.       Marland Kitchen glipiZIDE (GLUCOTROL) 5 MG tablet Take 2.5-5 mg by mouth 2 (two) times daily before a meal. 1/2 tab with morning meal, 1 whole tab with evening meal      . hydrochlorothiazide (HYDRODIURIL) 25 MG  tablet Take 25 mg by mouth every morning.       Marland Kitchen losartan (COZAAR) 100 MG tablet Take 100 mg by mouth daily with breakfast.       . MAGNESIUM PO Take 1-2 tablets by mouth as needed (constipation).      . Multiple Vitamin (MULTIVITAMIN) tablet Take 1 tablet by mouth daily.      . potassium chloride SA (K-DUR,KLOR-CON) 20 MEQ tablet Take 2 tablets (40 mEq total) by mouth daily after breakfast.  60 tablet  3   No current facility-administered medications for this visit.    No Known Allergies

## 2014-07-14 NOTE — Assessment & Plan Note (Signed)
Pt's cholesterol remains stable with medication compliance.  Pt does complain of leg cramps today.  ? Related to Lipitor.  She has taken this for years with no complaints.  Will have her try a drug holiday for a week to see if there is improvement.  If so, will discuss either another statin or addition of vitamin D or CoQ10 to help with cramps.  Given she is on Lipitor 80mg , Crestor would be the only option to get the same LDL reduction and she has Medicaid, which may limit use of Crestor.

## 2014-08-03 ENCOUNTER — Encounter: Payer: Self-pay | Admitting: Cardiovascular Disease

## 2014-08-03 ENCOUNTER — Ambulatory Visit (INDEPENDENT_AMBULATORY_CARE_PROVIDER_SITE_OTHER): Payer: Medicaid Other | Admitting: Cardiovascular Disease

## 2014-08-03 VITALS — BP 120/70 | HR 88 | Ht 63.0 in | Wt 352.0 lb

## 2014-08-03 DIAGNOSIS — R0609 Other forms of dyspnea: Secondary | ICD-10-CM

## 2014-08-03 DIAGNOSIS — I2699 Other pulmonary embolism without acute cor pulmonale: Secondary | ICD-10-CM

## 2014-08-03 DIAGNOSIS — R0989 Other specified symptoms and signs involving the circulatory and respiratory systems: Secondary | ICD-10-CM

## 2014-08-03 DIAGNOSIS — R0600 Dyspnea, unspecified: Secondary | ICD-10-CM

## 2014-08-03 DIAGNOSIS — E785 Hyperlipidemia, unspecified: Secondary | ICD-10-CM

## 2014-08-03 DIAGNOSIS — I359 Nonrheumatic aortic valve disorder, unspecified: Secondary | ICD-10-CM

## 2014-08-03 DIAGNOSIS — I350 Nonrheumatic aortic (valve) stenosis: Secondary | ICD-10-CM

## 2014-08-03 DIAGNOSIS — I2510 Atherosclerotic heart disease of native coronary artery without angina pectoris: Secondary | ICD-10-CM

## 2014-08-03 NOTE — Progress Notes (Signed)
History of Present Illness: 53 yo WF with history of CAD, OSA, HTN, HLD, obesity, GERD, asthma, COPD, former tobacco abuse, depression here today for cardiac follow up. She has been followed in the past in [facility]. I saw her for the first time in December 2012.She had a cardiac cath in February 2010 and had a drug eluting stent (2.59mm Xience) placed in the distal RCA per Dr. Melvern Banker. Her LAD and Circumflex were free of disease. She had her right knee replaced in February 8,2013. She developed SOB on 02/08/12 and was found to have a PE and pneumonia. She was started on coumadin and treated for 6 months. CTA chest on 08/09/12 showed no evidence of PE so her coumadin was stopped. I last saw her February 20,2014 and she c/o severe dyspnea with minimal exertion. I felt that this was multi-factorial. Chest CTA with no PE. I arranged a stress myoview which showed possible ischemia. Echo with severe AS, normal LV function. Cardiac cath on 03/17/13 with mild non-obstructive disease and patent stent RCA. TEE with moderate AS.   She is here today for cardiac follow up. She is feeling at baseline. No exertional chest pain. Occasional sharp chest pains. Her weight has been stable. She does not smoke. She denies syncope.  .  Primary Care Physician: Jana Hakim   Last Lipid Profile:Lipid Panel     Component Value Date/Time   CHOL 191 07/06/2014 1129   TRIG 106.0 07/06/2014 1129   HDL 53.50 07/06/2014 1129   CHOLHDL 4 07/06/2014 1129   VLDL 21.2 07/06/2014 1129   LDLCALC 116* 07/06/2014 1129     Past Medical History  Diagnosis Date  . Asthma   . Hypertension   . Hyperlipidemia   . GERD (gastroesophageal reflux disease)   . Depression   . Aortic stenosis, moderate     per 11/2011 echo  . Blood transfusion     2013[facility]  . Headache(784.0)     h/o migraines  . Pulmonary embolism 02/08/12    S/P RT TOTAL KNEE ON 02/03/12--ON 02/08/12--DEVELOPED ACUTE SOB AND CHEST PAIN--AND DIAGNOSED  WITH  PULMONARY EMBOLUS AND PNEUMONIA  . Restless leg syndrome   . Myocardial infarction     PT THINKS SHE WAS DX WITH MI AT THE TIME OF HEART STENTING  . CAD (coronary artery disease)     Cath 2010 with DES x 1 RCA-- PT'S CARDIOLOGIST IS DR. Broadview  . Uterine fibroid     NO PROBLEMS AT PRESENT FROM THE FIBROIDS-STATES SHE IS POST MENOPAUSAL-LAST MENSES 2010 EXCEPT FOR EPISODE THIS YR OF BLEEDING RELATED TO FIBROIDS.  Marland Kitchen Heart murmur   . Pneumonia 02/08/12    HOSPITALIZED AT [facility] WITH PNEUMONIA AND WITH PULMONARY EMBOLUS  . Diabetes mellitus DIAGNOSED IN2010    ORAL MEDS  . Arthritis     PAIN AND SEVERE OA LEFT KNEE ; S/P RIGHT TKA ON 02/03/12; HAS LOWER BACK PAIN-UNABLE TO STAND MORE THAN 10 MIN; ARTHRITIS "ALL OVER"  . Neck pain   . Weakness     BOTH HANDS - S/P BILATERAL CARPAL TUNNEL RELEASE--BUT STILL HAS WEAKNESS--OFTEN DROPS THINGS  . Shortness of breath     with exertion  . Eczema     on back  . Kidney stones   . Anemia   . COPD (chronic obstructive pulmonary disease)   . Sleep apnea     uses 3 liters O2 at night     Past Surgical History  Procedure Laterality Date  . Tubal ligation    . Carpal tunnel release      Bilateral  . Cholecystectomy    . Hernia repair    . Cardiac catheterization    . Knee arthroplasty  02/03/2012    Procedure: COMPUTER ASSISTED TOTAL KNEE ARTHROPLASTY;  Surgeon: Mcarthur Rossetti, MD;  Location: [facility];  Service: Orthopedics;  Laterality: Right;  Right total knee arthroplasty  . Coronary angioplasty      2010 has stent in place  . Total knee arthroplasty  09/10/2012    Procedure: TOTAL KNEE ARTHROPLASTY;  Surgeon: Mcarthur Rossetti, MD;  Location: [facility];  Service: Orthopedics;  Laterality: Left;  . Trigger finger release  09/10/2012    Procedure: RELEASE TRIGGER FINGER/A-1 PULLEY;  Surgeon: Mcarthur Rossetti, MD;  Location: [facility];  Service: Orthopedics;  Laterality: Right;  Right Ring Finger  . Tee without cardioversion N/A  03/14/2013    Procedure: TRANSESOPHAGEAL ECHOCARDIOGRAM (TEE);  Surgeon: Lelon Perla, MD;  Location: [facility];  Service: Cardiovascular;  Laterality: N/A;  . Joint replacement      bil total knees  . Total knee revision Right 07/15/2013    Procedure: REVISION ARTHROPLASTY RIGHT KNEE;  Surgeon: Mcarthur Rossetti, MD;  Location: [facility];  Service: Orthopedics;  Laterality: Right;  . Cystoscopy w/ retrogrades Right 09/21/2013    Procedure: CYSTOSCOPY WITH RIGHT RETROGRADE PYELOGRAM RIGHT DOUBLE J STENT ;  Surgeon: Fredricka Bonine, MD;  Location: [facility];  Service: Urology;  Laterality: Right;  . Cystoscopy with ureteroscopy and stent placement Right 10/25/2013    Procedure: CYSTOSCOPY RIGHT URETEROSCOPY HOLMIUM LASER LITHO AND STENT PLACEMENT;  Surgeon: Fredricka Bonine, MD;  Location: [facility];  Service: Urology;  Laterality: Right;    Current Outpatient Prescriptions  Medication Sig Dispense Refill  . albuterol (PROVENTIL HFA;VENTOLIN HFA) 108 (90 BASE) MCG/ACT inhaler Inhale 2 puffs into the lungs every 4 (four) hours as needed for wheezing or shortness of breath (((PLAN B))).       Marland Kitchen albuterol (PROVENTIL) (2.5 MG/3ML) 0.083% nebulizer solution Take 2.5 mg by nebulization every 4 (four) hours as needed for wheezing or shortness of breath (((PLAN C))).       Marland Kitchen aspirin EC 325 MG tablet Take 325 mg by mouth 2 (two) times daily.      Marland Kitchen atorvastatin (LIPITOR) 80 MG tablet TAKE ONE TABLET BY MOUTH ONCE DAILY AT BEDTIME  30 tablet  0  . budesonide-formoterol (SYMBICORT) 160-4.5 MCG/ACT inhaler Inhale 2 puffs into the lungs 2 (two) times daily.      . clopidogrel (PLAVIX) 75 MG tablet Take 75 mg by mouth every morning.       . escitalopram (LEXAPRO) 20 MG tablet Take 20 mg by mouth every morning.       . ezetimibe (ZETIA) 10 MG tablet Take 1 tablet (10 mg total) by mouth every evening.  30 tablet  6  . furosemide (LASIX) 40 MG tablet Take 40 mg by mouth 2 (two) times daily.        Marland Kitchen gabapentin (NEURONTIN) 300 MG capsule Take 300 mg by mouth 2 (two) times daily.       Marland Kitchen glipiZIDE (GLUCOTROL) 5 MG tablet Take 2.5-5 mg by mouth 2 (two) times daily before a meal. 1/2 tab with morning meal, 1 whole tab with evening meal      . hydrochlorothiazide (HYDRODIURIL) 25 MG tablet Take 25 mg by mouth every morning.       Marland Kitchen  losartan (COZAAR) 100 MG tablet Take 100 mg by mouth daily with breakfast.       . MAGNESIUM PO Take 1-2 tablets by mouth as needed (constipation).      . Multiple Vitamin (MULTIVITAMIN) tablet Take 1 tablet by mouth daily.      Donell Sievert IN Inhale into the lungs.      . potassium chloride SA (K-DUR,KLOR-CON) 20 MEQ tablet Take 2 tablets (40 mEq total) by mouth daily after breakfast.  60 tablet  3   No current facility-administered medications for this visit.    No Known Allergies  History   Social History  . Marital Status: Married    Spouse Name: N/A    Number of Children: 2  . Years of Education: N/A   Occupational History  . Disabled    Social History Main Topics  . Smoking status: Former Smoker -- 1.50 packs/day for 30 years    Types: Cigarettes    Quit date: 12/29/2000  . Smokeless tobacco: Never Used  . Alcohol Use: No  . Drug Use: No  . Sexual Activity: Yes    Birth Control/ Protection: Surgical   Other Topics Concern  . Not on file   Social History Narrative  . No narrative on file    Family History  Problem Relation Age of Onset  . Breast cancer Mother   . COPD Father     smoked  . Cancer Brother     Sinus  . Emphysema Mother     smoked  . Asthma Father   . Heart disease Mother   . Heart disease Father     Review of Systems:  As stated in the HPI and otherwise negative.   BP 120/70  Pulse 88  Ht 5\' 3"  (1.6 m)  Wt 352 lb (159.666 kg)  BMI 62.37 kg/m2  LMP 07/26/2009  Physical Examination: General: Well developed, well nourished, NAD HEENT: OP clear, mucus membranes moist SKIN: warm, dry. No  rashes. Neuro: No focal deficits Musculoskeletal: Muscle strength 5/5 all ext Psychiatric: Mood and affect normal Neck: No JVD, no carotid bruits, no thyromegaly, no lymphadenopathy. Lungs:Clear bilaterally, no wheezes, rhonci, crackles Cardiovascular: Regular rate and rhythm. No murmurs, gallops or rubs. Abdomen:Soft. Bowel sounds present. Non-tender.  Extremities: No lower extremity edema. Pulses are 2 + in the bilateral DP/PT.  Cardiac cath 03/17/13:  Hemodynamic Findings:  Ao: 125/77  LV: 156/14/21  RA: 18  RV: 48/13/16  PA: 48/17 (mean 36)  PCWP: 20  Fick Cardiac Output: 5.26 L/min  Fick Cardiac Index: 2.2 L/min/m2  Central Aortic Saturation: 92%  Pulmonary Artery Saturation: 57%  Aortic Valve Data: Peak to peak gradient 31 mmHg  Mean gradient 22 mmHg  AVA 1.15 cm2  Angiographic Findings:  Left main: No obstructive disease.  Left Anterior Descending Artery: Large caliber vessel that courses to the apex. There is a moderate caliber diagonal branch. No obstructive disease noted.  Circumflex Artery: Large caliber vessel with two small obtuse marginal branches and a moderate caliber posterolateral branch. No obstructive disease noted.  Right Coronary Artery: Large, dominant vessel with 40% proximal stenosis, 40% mid stenosis, patent stent distal vessel with no restenosis noted. The PDA and posterolateral branches are patent with no obstructive disease.   TEE 03/14/13:  Left ventricle: Systolic function was normal. The estimated ejection fraction was in the range of 60% to 65%. Wall motion was normal; there were no regional wall motion abnormalities. - Aortic valve: There was moderate stenosis. Mild regurgitation. -  Mitral valve: No evidence of vegetation. - Left atrium: The atrium was mildly dilated. No evidence of thrombus in the atrial cavity or appendage. - Atrial septum: Doppler showed no right-to-left atrial level shunt. - Tricuspid valve: No evidence of  vegetation.  CTA chest 02/21/13:  No evidence of pulmonary embolism.  8 mm patchy/nodular opacity in the superior segment right lower lobe, favored to be infectious/inflammatory. Follow-up CT chest is suggested in 3 months to assess for persistence.  Very mild additional infectious/inflammatory opacities in the right lung, as described above.  EKG: NSR, rate 94 bpm. Non-specific   Assessment and Plan:   1. Dyspnea: Stable. Worse lately in the heat. Felt to be multi-factorial. This could be related to her obesity, deconditioning, aortic stenosis, CAD, asthma or chronic thromboembolic disease. Cardiac cath March 2014 with non-obstructive CAD. Aortic stenosis appears to be moderate by TEE. Repeat echo now.   2. CAD: Stable. Cardiac cath March 2014 with mild non-obstructive CAD. Will continue ASA, Plavix, statin and ARB. No beta blocker secondary to asthma/COPD. BP well controlled.   3. Aortic valve stenosis: Moderate by TEE March 2014. Will repeat echo now.   4. H/O Pulmonary embolism: No residual PE on CTA 02/21/13. She has been off of coumadin.   5. Hyperlipidemia: Followed in lipid clinic. LDL 116 July 2015.

## 2014-08-03 NOTE — Patient Instructions (Signed)
Your physician wants you to follow-up in:  6 months. You will receive a reminder letter in the mail two months in advance. If you don't receive a letter, please call our office to schedule the follow-up appointment.  Your physician has requested that you have an echocardiogram. Echocardiography is a painless test that uses sound waves to create images of your heart. It provides your doctor with information about the size and shape of your heart and how well your heart's chambers and valves are working. This procedure takes approximately one hour. There are no restrictions for this procedure.    

## 2014-08-10 ENCOUNTER — Ambulatory Visit (HOSPITAL_COMMUNITY): Payer: Medicaid Other | Attending: Cardiovascular Disease | Admitting: Radiology

## 2014-08-10 DIAGNOSIS — Z87891 Personal history of nicotine dependence: Secondary | ICD-10-CM | POA: Insufficient documentation

## 2014-08-10 DIAGNOSIS — G4733 Obstructive sleep apnea (adult) (pediatric): Secondary | ICD-10-CM | POA: Insufficient documentation

## 2014-08-10 DIAGNOSIS — J189 Pneumonia, unspecified organism: Secondary | ICD-10-CM | POA: Diagnosis not present

## 2014-08-10 DIAGNOSIS — R0602 Shortness of breath: Secondary | ICD-10-CM | POA: Diagnosis not present

## 2014-08-10 DIAGNOSIS — R011 Cardiac murmur, unspecified: Secondary | ICD-10-CM | POA: Insufficient documentation

## 2014-08-10 DIAGNOSIS — E119 Type 2 diabetes mellitus without complications: Secondary | ICD-10-CM | POA: Insufficient documentation

## 2014-08-10 DIAGNOSIS — J4489 Other specified chronic obstructive pulmonary disease: Secondary | ICD-10-CM | POA: Insufficient documentation

## 2014-08-10 DIAGNOSIS — I2510 Atherosclerotic heart disease of native coronary artery without angina pectoris: Secondary | ICD-10-CM | POA: Diagnosis not present

## 2014-08-10 DIAGNOSIS — I059 Rheumatic mitral valve disease, unspecified: Secondary | ICD-10-CM | POA: Diagnosis not present

## 2014-08-10 DIAGNOSIS — E785 Hyperlipidemia, unspecified: Secondary | ICD-10-CM | POA: Diagnosis not present

## 2014-08-10 DIAGNOSIS — I10 Essential (primary) hypertension: Secondary | ICD-10-CM | POA: Insufficient documentation

## 2014-08-10 DIAGNOSIS — R0600 Dyspnea, unspecified: Secondary | ICD-10-CM

## 2014-08-10 DIAGNOSIS — I2699 Other pulmonary embolism without acute cor pulmonale: Secondary | ICD-10-CM | POA: Diagnosis not present

## 2014-08-10 DIAGNOSIS — I359 Nonrheumatic aortic valve disorder, unspecified: Secondary | ICD-10-CM | POA: Insufficient documentation

## 2014-08-10 DIAGNOSIS — I350 Nonrheumatic aortic (valve) stenosis: Secondary | ICD-10-CM

## 2014-08-10 DIAGNOSIS — J449 Chronic obstructive pulmonary disease, unspecified: Secondary | ICD-10-CM | POA: Insufficient documentation

## 2014-08-10 NOTE — Progress Notes (Signed)
Echocardiogram performed.  

## 2014-08-14 ENCOUNTER — Other Ambulatory Visit: Payer: Self-pay | Admitting: Cardiovascular Disease

## 2014-08-17 ENCOUNTER — Telehealth: Payer: Self-pay | Admitting: *Deleted

## 2014-08-17 NOTE — Telephone Encounter (Signed)
Spoke with pt and gave her information from [place]. She will let us know if symptoms change.

## 2014-08-17 NOTE — Telephone Encounter (Signed)
Does not sound cardiac. Her dyspnea is likely due to her morbid obesity (352 lbs at 5'3"), COPD, OSA and deconditioning. No cardiac workup indicated at this time.

## 2014-08-17 NOTE — Telephone Encounter (Signed)
Spoke with pt and reviewed echo results with her. She reports occasional sharp chest pains which she discussed with Dr. Angelena Form.  She reports shortness of breath with activities like walking to the car. Has not passed out but states she often feels light headed when standing. She has to catch her breath and then can go on.  She reports she did not discuss this with Dr. Angelena Form at last office visit. Will review with Dr. Angelena Form

## 2014-08-30 ENCOUNTER — Other Ambulatory Visit: Payer: Self-pay | Admitting: Cardiovascular Disease

## 2014-08-30 ENCOUNTER — Telehealth: Payer: Self-pay | Admitting: Cardiovascular Disease

## 2014-08-30 NOTE — Telephone Encounter (Signed)
Spoke with pt who reports shortness of breath and feeling exhausted. She feels like she wants to lie down all the time and has no energy. She feels shortness of breath has gotten worse since I spoke with her on August 17, 2014. She reports it is worse when she goes outside in the heat but she is short of breath in her house in the air conditioning also.  Will forward to Dr. Angelena Form for review and recommendations.

## 2014-08-30 NOTE — Telephone Encounter (Signed)
Spoke with pt and she will be here for appt on September 07, 2014 at 2:15

## 2014-08-30 NOTE — Telephone Encounter (Signed)
New message     Pt was seen 1wk ago---she has no energy and she is breathing "hard".  She said the nurse wanted her to call back if any of these symptons occur.

## 2014-08-30 NOTE — Telephone Encounter (Signed)
If she is having more trouble then I need to see her back in the office. I can see her on the 10th at 2:15. cdm

## 2014-09-07 ENCOUNTER — Ambulatory Visit (INDEPENDENT_AMBULATORY_CARE_PROVIDER_SITE_OTHER): Payer: Medicaid Other | Admitting: Cardiovascular Disease

## 2014-09-07 ENCOUNTER — Encounter: Payer: Self-pay | Admitting: Cardiovascular Disease

## 2014-09-07 ENCOUNTER — Encounter: Payer: Self-pay | Admitting: *Deleted

## 2014-09-07 VITALS — BP 135/73 | HR 96 | Ht 63.0 in | Wt 354.0 lb

## 2014-09-07 DIAGNOSIS — I2510 Atherosclerotic heart disease of native coronary artery without angina pectoris: Secondary | ICD-10-CM

## 2014-09-07 DIAGNOSIS — R0609 Other forms of dyspnea: Secondary | ICD-10-CM

## 2014-09-07 DIAGNOSIS — I359 Nonrheumatic aortic valve disorder, unspecified: Secondary | ICD-10-CM

## 2014-09-07 DIAGNOSIS — R0989 Other specified symptoms and signs involving the circulatory and respiratory systems: Secondary | ICD-10-CM

## 2014-09-07 DIAGNOSIS — E785 Hyperlipidemia, unspecified: Secondary | ICD-10-CM

## 2014-09-07 DIAGNOSIS — R0600 Dyspnea, unspecified: Secondary | ICD-10-CM

## 2014-09-07 DIAGNOSIS — I2782 Chronic pulmonary embolism: Secondary | ICD-10-CM

## 2014-09-07 LAB — BASIC METABOLIC PANEL
BUN: 12 mg/dL (ref 6–23)
CALCIUM: 9.3 mg/dL (ref 8.4–10.5)
CO2: 36 meq/L — AB (ref 19–32)
Chloride: 95 mEq/L — ABNORMAL LOW (ref 96–112)
Creatinine, Ser: 1 mg/dL (ref 0.4–1.2)
GFR: 61.17 mL/min (ref >60.00)
Glucose, Bld: 137 mg/dL — ABNORMAL HIGH (ref 70–99)
Potassium: 3.7 mEq/L (ref 3.5–5.1)
Sodium: 138 mEq/L (ref 135–145)

## 2014-09-07 LAB — CBC
HEMATOCRIT: 36.3 % (ref 36.0–46.0)
Hemoglobin: 11.8 g/dL — ABNORMAL LOW (ref 12.0–15.0)
MCHC: 32.6 g/dL (ref 30.0–36.0)
MCV: 81.7 fl (ref 78.0–100.0)
Platelets: 269 10*3/uL (ref 150.0–400.0)
RBC: 4.44 Mil/uL (ref 3.87–5.11)
RDW: 15.9 % — ABNORMAL HIGH (ref 11.5–15.5)
WBC: 9.3 10*3/uL (ref 4.0–10.5)

## 2014-09-07 LAB — PROTIME-INR
INR: 1 ratio (ref 0.8–1.0)
PROTHROMBIN TIME: 11.2 s (ref 9.6–13.1)

## 2014-09-07 NOTE — Patient Instructions (Addendum)
Your physician has requested that you have a cardiac catheterization. Cardiac catheterization is used to diagnose and/or treat various heart conditions. Doctors may recommend this procedure for a number of different reasons. The most common reason is to evaluate chest pain. Chest pain can be a symptom of coronary artery disease (CAD), and cardiac catheterization can show whether plaque is narrowing or blocking your heart's arteries. This procedure is also used to evaluate the valves, as well as measure the blood flow and oxygen levels in different parts of your heart. For further information please visit [email or web address]. Please follow instruction sheet, as given. Scheduled for September 14, 2014   Your physician has recommended you make the following change in your medication:  Stop Hydrochlorothiazide

## 2014-09-07 NOTE — Progress Notes (Signed)
History of Present Illness: 53 yo WF with history of CAD, OSA, HTN, HLD, obesity, GERD, asthma, COPD, former tobacco abuse, depression here today for cardiac follow up. She has been followed in the past in [facility]. I saw her for the first time in December 2012. She had a cardiac cath in February 2010 and had a drug eluting stent (2.65mm Xience) placed in the distal RCA per Dr. Melvern Banker. Her LAD and Circumflex were free of disease. She had her right knee replaced in February 8,2013. She developed SOB on 02/08/12 and was found to have a PE and pneumonia. She was started on coumadin and treated for 6 months. CTA chest on 08/09/12 showed no evidence of PE so her coumadin was stopped. I saw her February 20,2014 and she c/o severe dyspnea with minimal exertion. I felt that this was multi-factorial. Chest CTA with no PE. I arranged a stress myoview which showed possible ischemia. Echo with severe AS, normal LV function. Cardiac cath on 03/17/13 with mild non-obstructive disease and patent stent RCA. TEE with moderate AS. Most recent echo  08/10/14 with normal LV function, severe AS with mean gradient of 50 mmHg, peak gradient 77 mmHg.   She is here today for cardiac follow up. She describes recent worsening of dyspnea with exertion. She also describes chest pain and dizziness. This has all progressed over the last 2-3 weeks. She cannot walk across the room without becoming dyspneic. Her weight has been stable. She does not smoke. She denies syncope.  .  Primary Care Physician: Jana Hakim   Last Lipid Profile:Lipid Panel     Component Value Date/Time   CHOL 191 07/06/2014 1129   TRIG 106.0 07/06/2014 1129   HDL 53.50 07/06/2014 1129   CHOLHDL 4 07/06/2014 1129   VLDL 21.2 07/06/2014 1129   LDLCALC 116* 07/06/2014 1129     Past Medical History  Diagnosis Date  . Asthma   . Hypertension   . Hyperlipidemia   . GERD (gastroesophageal reflux disease)   . Depression   . Aortic  stenosis, moderate     per 11/2011 echo  . Blood transfusion     2013[facility]  . Headache(784.0)     h/o migraines  . Pulmonary embolism 02/08/12    S/P RT TOTAL KNEE ON 02/03/12--ON 02/08/12--DEVELOPED ACUTE SOB AND CHEST PAIN--AND DIAGNOSED WITH  PULMONARY EMBOLUS AND PNEUMONIA  . Restless leg syndrome   . Myocardial infarction     PT THINKS SHE WAS DX WITH MI AT THE TIME OF HEART STENTING  . CAD (coronary artery disease)     Cath 2010 with DES x 1 RCA-- PT'S CARDIOLOGIST IS DR. Badger  . Uterine fibroid     NO PROBLEMS AT PRESENT FROM THE FIBROIDS-STATES SHE IS POST MENOPAUSAL-LAST MENSES 2010 EXCEPT FOR EPISODE THIS YR OF BLEEDING RELATED TO FIBROIDS.  Marland Kitchen Heart murmur   . Pneumonia 02/08/12    HOSPITALIZED AT [facility] WITH PNEUMONIA AND WITH PULMONARY EMBOLUS  . Diabetes mellitus DIAGNOSED IN2010    ORAL MEDS  . Arthritis     PAIN AND SEVERE OA LEFT KNEE ; S/P RIGHT TKA ON 02/03/12; HAS LOWER BACK PAIN-UNABLE TO STAND MORE THAN 10 MIN; ARTHRITIS "ALL OVER"  . Neck pain   . Weakness     BOTH HANDS - S/P BILATERAL CARPAL TUNNEL RELEASE--BUT STILL HAS WEAKNESS--OFTEN DROPS THINGS  . Shortness of breath     with exertion  . Eczema  on back  . Kidney stones   . Anemia   . COPD (chronic obstructive pulmonary disease)   . Sleep apnea     uses 3 liters O2 at night     Past Surgical History  Procedure Laterality Date  . Tubal ligation    . Carpal tunnel release      Bilateral  . Cholecystectomy    . Hernia repair    . Cardiac catheterization    . Knee arthroplasty  02/03/2012    Procedure: COMPUTER ASSISTED TOTAL KNEE ARTHROPLASTY;  Surgeon: Mcarthur Rossetti, MD;  Location: [facility];  Service: Orthopedics;  Laterality: Right;  Right total knee arthroplasty  . Coronary angioplasty      2010 has stent in place  . Total knee arthroplasty  09/10/2012    Procedure: TOTAL KNEE ARTHROPLASTY;  Surgeon: Mcarthur Rossetti, MD;  Location: [facility];  Service: Orthopedics;  Laterality: Left;    . Trigger finger release  09/10/2012    Procedure: RELEASE TRIGGER FINGER/A-1 PULLEY;  Surgeon: Mcarthur Rossetti, MD;  Location: [facility];  Service: Orthopedics;  Laterality: Right;  Right Ring Finger  . Tee without cardioversion N/A 03/14/2013    Procedure: TRANSESOPHAGEAL ECHOCARDIOGRAM (TEE);  Surgeon: Lelon Perla, MD;  Location: [facility];  Service: Cardiovascular;  Laterality: N/A;  . Joint replacement      bil total knees  . Total knee revision Right 07/15/2013    Procedure: REVISION ARTHROPLASTY RIGHT KNEE;  Surgeon: Mcarthur Rossetti, MD;  Location: [facility];  Service: Orthopedics;  Laterality: Right;  . Cystoscopy w/ retrogrades Right 09/21/2013    Procedure: CYSTOSCOPY WITH RIGHT RETROGRADE PYELOGRAM RIGHT DOUBLE J STENT ;  Surgeon: Fredricka Bonine, MD;  Location: [facility];  Service: Urology;  Laterality: Right;  . Cystoscopy with ureteroscopy and stent placement Right 10/25/2013    Procedure: CYSTOSCOPY RIGHT URETEROSCOPY HOLMIUM LASER LITHO AND STENT PLACEMENT;  Surgeon: Fredricka Bonine, MD;  Location: [facility];  Service: Urology;  Laterality: Right;    Current Outpatient Prescriptions  Medication Sig Dispense Refill  . albuterol (PROVENTIL HFA;VENTOLIN HFA) 108 (90 BASE) MCG/ACT inhaler Inhale 2 puffs into the lungs every 4 (four) hours as needed for wheezing or shortness of breath (((PLAN B))).       Marland Kitchen albuterol (PROVENTIL) (2.5 MG/3ML) 0.083% nebulizer solution Take 2.5 mg by nebulization every 4 (four) hours as needed for wheezing or shortness of breath (((PLAN C))).       Marland Kitchen aspirin EC 325 MG tablet Take 325 mg by mouth 2 (two) times daily.      Marland Kitchen atorvastatin (LIPITOR) 80 MG tablet TAKE ONE TABLET BY MOUTH ONCE DAILY AT BEDTIME  30 tablet  0  . budesonide-formoterol (SYMBICORT) 160-4.5 MCG/ACT inhaler Inhale 2 puffs into the lungs 2 (two) times daily.      . clopidogrel (PLAVIX) 75 MG tablet Take 75 mg by mouth every morning.       . escitalopram  (LEXAPRO) 20 MG tablet Take 20 mg by mouth every morning.       . furosemide (LASIX) 40 MG tablet Take 40 mg by mouth 2 (two) times daily.      Marland Kitchen gabapentin (NEURONTIN) 300 MG capsule Take 300 mg by mouth 2 (two) times daily.       Marland Kitchen glipiZIDE (GLUCOTROL) 5 MG tablet Take 5 mg by mouth daily before supper.       . hydrochlorothiazide (HYDRODIURIL) 25 MG tablet Take 25 mg by mouth every morning.       Marland Kitchen  KLOR-CON M20 20 MEQ tablet TAKE 2 TABLETS BY MOUTH DAILY AFTER BREAKFAST  60 tablet  3  . losartan (COZAAR) 100 MG tablet Take 100 mg by mouth daily with breakfast.       . MAGNESIUM PO Take 1-2 tablets by mouth as needed (constipation).      . Multiple Vitamin (MULTIVITAMIN) tablet Take 1 tablet by mouth daily.      Donell Sievert IN Inhale into the lungs.      Marland Kitchen ZETIA 10 MG tablet TAKE ONE TABLET BY MOUTH ONCE DAILY IN THE EVENING  30 tablet  3   No current facility-administered medications for this visit.    No Known Allergies  History   Social History  . Marital Status: Married    Spouse Name: N/A    Number of Children: 2  . Years of Education: N/A   Occupational History  . Disabled    Social History Main Topics  . Smoking status: Former Smoker -- 1.50 packs/day for 30 years    Types: Cigarettes    Quit date: 12/29/2000  . Smokeless tobacco: Never Used  . Alcohol Use: No  . Drug Use: No  . Sexual Activity: Yes    Birth Control/ Protection: Surgical   Other Topics Concern  . Not on file   Social History Narrative  . No narrative on file    Family History  Problem Relation Age of Onset  . Breast cancer Mother   . COPD Father     smoked  . Cancer Brother     Sinus  . Emphysema Mother     smoked  . Asthma Father   . Heart disease Mother   . Heart disease Father     Review of Systems:  As stated in the HPI and otherwise negative.   BP 135/73  Pulse 96  Ht 5\' 3"  (1.6 m)  Wt 354 lb (160.573 kg)  BMI 62.72 kg/m2  SpO2 96%  LMP 07/26/2009  Physical  Examination: General: Well developed, well nourished, NAD HEENT: OP clear, mucus membranes moist SKIN: warm, dry. No rashes. Neuro: No focal deficits Musculoskeletal: Muscle strength 5/5 all ext Psychiatric: Mood and affect normal Neck: No JVD, no carotid bruits, no thyromegaly, no lymphadenopathy. Lungs:Clear bilaterally, no wheezes, rhonci, crackles Cardiovascular: Regular rate and rhythm. Loud systolic murmur.  Abdomen:Soft. Bowel sounds present. Non-tender.  Extremities: No lower extremity edema. Pulses are 2 + in the bilateral DP/PT.  Cardiac cath 03/17/13:  Hemodynamic Findings:  Ao: 125/77  LV: 156/14/21  RA: 18  RV: 48/13/16  PA: 48/17 (mean 36)  PCWP: 20  Fick Cardiac Output: 5.26 L/min  Fick Cardiac Index: 2.2 L/min/m2  Central Aortic Saturation: 92%  Pulmonary Artery Saturation: 57%  Aortic Valve Data: Peak to peak gradient 31 mmHg  Mean gradient 22 mmHg  AVA 1.15 cm2  Angiographic Findings:  Left main: No obstructive disease.  Left Anterior Descending Artery: Large caliber vessel that courses to the apex. There is a moderate caliber diagonal branch. No obstructive disease noted.  Circumflex Artery: Large caliber vessel with two small obtuse marginal branches and a moderate caliber posterolateral branch. No obstructive disease noted.  Right Coronary Artery: Large, dominant vessel with 40% proximal stenosis, 40% mid stenosis, patent stent distal vessel with no restenosis noted. The PDA and posterolateral branches are patent with no obstructive disease.   Echo 08/10/14: Left ventricle: The cavity size was normal. Wall thickness was normal. Systolic function was normal. The estimated ejection fraction was in  the range of 60% to 65%. Doppler parameters are consistent with abnormal left ventricular relaxation (grade 1 diastolic dysfunction). - Aortic valve: AV is difficult to see well From right upper parasternal window peak and mean gradients through the valve are 77  and 50 mm Hg respectively consistent with severe/critical AS. This is increased from echo of 2014.  CTA chest 02/21/13:  No evidence of pulmonary embolism. 8 mm patchy/nodular opacity in the superior segment right lower lobe, favored to be infectious/inflammatory. Follow-up CT chest is suggested in 3 months to assess for persistence. Very mild additional infectious/inflammatory opacities in the right lung, as described above.   Assessment and Plan:   1. Dyspnea: Worsened. Felt to be multi-factorial due to morbid obesity, deconditioning, severe aortic stenosis, CAD, asthma or chronic thromboembolic disease. Clearly her AS is severe. She will be a high risk candidate for AVR but will have to pursue this. Will review her case with our CT surgeons and get an idea if she could be considered for traditional surgery. TAVR may be an option.   2. CAD: Cardiac cath March 2014 with mild non-obstructive CAD. Will continue ASA, Plavix, statin and ARB. No beta blocker secondary to asthma/COPD. Will plan right and left heart cath as part of pre-AVR workup. Also recent chest pains. Stop HCTZ.   3. Aortic valve stenosis: Severe by Echo August 2015. She is now having worsened dyspnea, chest pain and dizziness. Will plan right and left heart cath next week on 09/14/14. Risks and benefits reviewed with pt. After cath, will need referral for AVR. Her weight will make her high risk for a traditional AVR   4. H/O Pulmonary embolism: No residual PE on CTA 02/21/13. She has been off of coumadin.   5. Hyperlipidemia: Followed in lipid clinic. LDL 116 July 2015.

## 2014-09-12 ENCOUNTER — Encounter (HOSPITAL_COMMUNITY): Payer: Self-pay | Admitting: Pharmacy Technician

## 2014-09-14 ENCOUNTER — Ambulatory Visit (HOSPITAL_COMMUNITY)
Admission: RE | Admit: 2014-09-14 | Discharge: 2014-09-14 | Disposition: A | Discharge status: Home / Self Care | Payer: Medicaid Other | Source: Ambulatory Visit | Attending: Cardiovascular Disease | Admitting: Cardiovascular Disease

## 2014-09-14 ENCOUNTER — Encounter (HOSPITAL_COMMUNITY)
Admission: RE | Disposition: A | Discharge status: Home / Self Care | Payer: Self-pay | Source: Ambulatory Visit | Attending: Cardiovascular Disease

## 2014-09-14 DIAGNOSIS — F3289 Other specified depressive episodes: Secondary | ICD-10-CM | POA: Insufficient documentation

## 2014-09-14 DIAGNOSIS — Z96659 Presence of unspecified artificial knee joint: Secondary | ICD-10-CM | POA: Insufficient documentation

## 2014-09-14 DIAGNOSIS — G2581 Restless legs syndrome: Secondary | ICD-10-CM | POA: Insufficient documentation

## 2014-09-14 DIAGNOSIS — I359 Nonrheumatic aortic valve disorder, unspecified: Secondary | ICD-10-CM | POA: Insufficient documentation

## 2014-09-14 DIAGNOSIS — F329 Major depressive disorder, single episode, unspecified: Secondary | ICD-10-CM | POA: Diagnosis not present

## 2014-09-14 DIAGNOSIS — I2510 Atherosclerotic heart disease of native coronary artery without angina pectoris: Secondary | ICD-10-CM | POA: Insufficient documentation

## 2014-09-14 DIAGNOSIS — J449 Chronic obstructive pulmonary disease, unspecified: Secondary | ICD-10-CM | POA: Diagnosis not present

## 2014-09-14 DIAGNOSIS — Z87891 Personal history of nicotine dependence: Secondary | ICD-10-CM | POA: Insufficient documentation

## 2014-09-14 DIAGNOSIS — E785 Hyperlipidemia, unspecified: Secondary | ICD-10-CM | POA: Diagnosis not present

## 2014-09-14 DIAGNOSIS — G4733 Obstructive sleep apnea (adult) (pediatric): Secondary | ICD-10-CM | POA: Insufficient documentation

## 2014-09-14 DIAGNOSIS — J4489 Other specified chronic obstructive pulmonary disease: Secondary | ICD-10-CM | POA: Insufficient documentation

## 2014-09-14 DIAGNOSIS — E119 Type 2 diabetes mellitus without complications: Secondary | ICD-10-CM | POA: Insufficient documentation

## 2014-09-14 DIAGNOSIS — Z9861 Coronary angioplasty status: Secondary | ICD-10-CM | POA: Diagnosis not present

## 2014-09-14 DIAGNOSIS — K219 Gastro-esophageal reflux disease without esophagitis: Secondary | ICD-10-CM | POA: Insufficient documentation

## 2014-09-14 DIAGNOSIS — I10 Essential (primary) hypertension: Secondary | ICD-10-CM | POA: Insufficient documentation

## 2014-09-14 DIAGNOSIS — Z86711 Personal history of pulmonary embolism: Secondary | ICD-10-CM | POA: Insufficient documentation

## 2014-09-14 HISTORY — PX: LEFT AND RIGHT HEART CATHETERIZATION WITH CORONARY/GRAFT ANGIOGRAM: SHX5448

## 2014-09-14 LAB — POCT I-STAT 3, VENOUS BLOOD GAS (G3P V)
Acid-Base Excess: 5 mmol/L — ABNORMAL HIGH (ref 0.0–2.0)
Bicarbonate: 32 mEq/L — ABNORMAL HIGH (ref 20.0–24.0)
O2 Saturation: 59 %
PCO2 VEN: 61.5 mmHg — AB (ref 45.0–50.0)
TCO2: 34 mmol/L (ref 0–100)
pH, Ven: 7.323 — ABNORMAL HIGH (ref 7.250–7.300)
pO2, Ven: 34 mmHg (ref 30.0–45.0)

## 2014-09-14 LAB — GLUCOSE, CAPILLARY
Glucose-Capillary: 144 mg/dL — ABNORMAL HIGH (ref 70–99)
Glucose-Capillary: 151 mg/dL — ABNORMAL HIGH (ref 70–99)

## 2014-09-14 LAB — POCT I-STAT 3, ART BLOOD GAS (G3+)
Acid-Base Excess: 4 mmol/L — ABNORMAL HIGH (ref 0.0–2.0)
BICARBONATE: 30.4 meq/L — AB (ref 20.0–24.0)
O2 Saturation: 83 %
PH ART: 7.343 — AB (ref 7.350–7.450)
PO2 ART: 51 mmHg — AB (ref 80.0–100.0)
TCO2: 32 mmol/L (ref 0–100)
pCO2 arterial: 55.9 mmHg — ABNORMAL HIGH (ref 35.0–45.0)

## 2014-09-14 LAB — POCT ACTIVATED CLOTTING TIME: Activated Clotting Time: 163 seconds

## 2014-09-14 SURGERY — LEFT AND RIGHT HEART CATHETERIZATION WITH CORONARY/GRAFT ANGIOGRAM
Anesthesia: LOCAL

## 2014-09-14 MED ORDER — SODIUM CHLORIDE 0.9 % IV SOLN
INTRAVENOUS | Status: DC
  Administered 2014-09-14: 08:00:00 via INTRAVENOUS

## 2014-09-14 MED ORDER — SODIUM CHLORIDE 0.9 % IJ SOLN: 3.0000 mL | Freq: Two times a day (BID) | INTRAMUSCULAR | Status: DC

## 2014-09-14 MED ORDER — NITROGLYCERIN 1 MG/10 ML FOR IR/CATH LAB
INTRA_ARTERIAL | Status: AC
Start: 2014-09-14 — End: 2014-09-14
  Filled 2014-09-14: qty 10

## 2014-09-14 MED ORDER — VERAPAMIL HCL 2.5 MG/ML IV SOLN
INTRAVENOUS | Status: AC
  Filled 2014-09-14: qty 2

## 2014-09-14 MED ORDER — ASPIRIN 81 MG PO CHEW
CHEWABLE_TABLET | ORAL | Status: AC
  Administered 2014-09-14: 81 mg via ORAL
  Filled 2014-09-14: qty 1

## 2014-09-14 MED ORDER — MIDAZOLAM HCL 2 MG/2ML IJ SOLN
INTRAMUSCULAR | Status: AC
  Filled 2014-09-14: qty 2

## 2014-09-14 MED ORDER — FENTANYL CITRATE 0.05 MG/ML IJ SOLN
INTRAMUSCULAR | Status: AC
  Filled 2014-09-14: qty 2

## 2014-09-14 MED ORDER — SODIUM CHLORIDE 0.9 % IJ SOLN: 3.0000 mL | INTRAMUSCULAR | Status: DC | PRN

## 2014-09-14 MED ORDER — HEPARIN SODIUM (PORCINE) 1000 UNIT/ML IJ SOLN
INTRAMUSCULAR | Status: AC
  Filled 2014-09-14: qty 1

## 2014-09-14 MED ORDER — HEPARIN (PORCINE) IN NACL 2-0.9 UNIT/ML-% IJ SOLN
INTRAMUSCULAR | Status: AC
  Filled 2014-09-14: qty 1000

## 2014-09-14 MED ORDER — ASPIRIN 81 MG PO CHEW
81.0000 mg | CHEWABLE_TABLET | ORAL | Status: AC
  Administered 2014-09-14: 81 mg via ORAL

## 2014-09-14 MED ORDER — LIDOCAINE HCL (PF) 1 % IJ SOLN
INTRAMUSCULAR | Status: AC
  Filled 2014-09-14: qty 30

## 2014-09-14 MED ORDER — SODIUM CHLORIDE 0.9 % IV SOLN: 250.0000 mL | INTRAVENOUS | Status: DC | PRN

## 2014-09-14 MED ORDER — SODIUM CHLORIDE 0.9 % IV SOLN: INTRAVENOUS | Status: AC

## 2014-09-14 NOTE — Progress Notes (Signed)
Site area: right groin a 12fr venous sheath removed  Site Prior to Removal:  Level 0  Pressure Applied For 15 MINUTES    Minutes Beginning at  1015am  Manual:   Yes.    Patient Status During Pull:  Stable  Post Pull Groin Site:  Level 0  Post Pull Instructions Given:  Yes.    Post Pull Pulses Present:  Yes.    Dressing Applied:  Yes.    Comments:  VAS remain stable during sheath pull.  Pt denies any pain at the right groin at this time

## 2014-09-14 NOTE — Discharge Instructions (Signed)
Radial Site Care Refer to this sheet in the next few weeks. These instructions provide you with information on caring for yourself after your procedure. Your caregiver may also give you more specific instructions. Your treatment has been planned according to current medical practices, but problems sometimes occur. Call your caregiver if you have any problems or questions after your procedure. HOME CARE INSTRUCTIONS  You may shower the day after the procedure.Remove the bandage (dressing) and gently wash the site with plain soap and water.Gently pat the site dry.  Do not apply powder or lotion to the site.  Do not submerge the affected site in water for 3 to 5 days.  Inspect the site at least twice daily.  Do not flex or bend the affected arm for 24 hours.  No lifting over 5 pounds (2.3 kg) for 5 days after your procedure.  Do not drive home if you are discharged the same day of the procedure. Have someone else drive you.  You may drive 24 hours after the procedure unless otherwise instructed by your caregiver.  Do not operate machinery or power tools for 24 hours.  A responsible adult should be with you for the first 24 hours after you arrive home. What to expect:  Any bruising will usually fade within 1 to 2 weeks.  Blood that collects in the tissue (hematoma) may be painful to the touch. It should usually decrease in size and tenderness within 1 to 2 weeks. SEEK IMMEDIATE MEDICAL CARE IF:  You have unusual pain at the radial site.  You have redness, warmth, swelling, or pain at the radial site.  You have drainage (other than a small amount of blood on the dressing).  You have chills.  You have a fever or persistent symptoms for more than 72 hours.  You have a fever and your symptoms suddenly get worse.  Your arm becomes pale, cool, tingly, or numb.  You have heavy bleeding from the site. Hold pressure on the site. Document Released: 01/17/2011 Document Revised:  03/08/2012 Document Reviewed: 01/17/2011 [facility] Patient Information 2015 [place], [place]. This information is not intended to replace advice given to you by your health care provider. Make sure you discuss any questions you have with your health care provider.   Angiogram, Care After Refer to this sheet in the next few weeks. These instructions provide you with information on caring for yourself after your procedure. Your health care provider may also give you more specific instructions. Your treatment has been planned according to current medical practices, but problems sometimes occur. Call your health care provider if you have any problems or questions after your procedure.  WHAT TO EXPECT AFTER THE PROCEDURE After your procedure, it is typical to have the following sensations:  Minor discomfort or tenderness and a small bump at the catheter insertion site. The bump should usually decrease in size and tenderness within 1 to 2 weeks.  Any bruising will usually fade within 2 to 4 weeks. HOME CARE INSTRUCTIONS   You may need to keep taking blood thinners if they were prescribed for you. Take medicines only as directed by your health care provider.  Do not apply powder or lotion to the site.  Do not take baths, swim, or use a hot tub until your health care provider approves.  You may shower 24 hours after the procedure. Remove the bandage (dressing) and gently wash the site with plain soap and water. Gently pat the site dry.  Inspect the site at  least twice daily.  Limit your activity for the first 48 hours. Do not bend, squat, or lift anything over 20 lb (9 kg) or as directed by your health care provider.  Plan to have someone take you home after the procedure. Follow instructions about when you can drive or return to work. SEEK MEDICAL CARE IF:  You get light-headed when standing up.  You have drainage (other than a small amount of blood on the dressing).  You have chills.  You  have a fever.  You have redness, warmth, swelling, or pain at the insertion site. SEEK IMMEDIATE MEDICAL CARE IF:   You develop chest pain or shortness of breath, feel faint, or pass out.  You have bleeding, swelling larger than a walnut, or drainage from the catheter insertion site.  You develop pain, discoloration, coldness, or severe bruising in the leg or arm that held the catheter.  You have heavy bleeding from the site. If this happens, hold pressure on the site and call 911. MAKE SURE YOU:  Understand these instructions.  Will watch your condition.  Will get help right away if you are not doing well or get worse. Document Released: 07/03/2005 Document Revised: 05/01/2014 Document Reviewed: 05/09/2013 [facility] Patient Information 2015 [place], [place]. This information is not intended to replace advice given to you by your health care provider. Make sure you discuss any questions you have with your health care provider.

## 2014-09-14 NOTE — CV Procedure (Signed)
Cardiac Catheterization Operative Report  [name] [id number] 9/17/20159:55 AM Theressa Millard, MD  Procedure Performed:  1. Left Heart Catheterization 2. Selective Coronary Angiography 3. Right Heart Catheterization  Operator: Lauree Chandler, MD  Indication: 53 yo WF with history of CAD, OSA, HTN, HLD, obesity, GERD, asthma, COPD, former tobacco abuse, depression here today for cardiac catheterization. I saw her for the first time in December 2012. She had a cardiac cath in February 2010 and had a drug eluting stent (2.65mm Xience) placed in the distal RCA per Dr. Melvern Banker. Her LAD and Circumflex were free of disease. She had her right knee replaced in February 8,2013. She developed SOB on 02/08/12 and was found to have a PE and pneumonia. She was started on coumadin and treated for 6 months. CTA chest on 08/09/12 showed no evidence of PE so her coumadin was stopped. I saw her February 20,2014 and she c/o severe dyspnea with minimal exertion. I felt that this was multi-factorial. Chest CTA with no PE. I arranged a stress myoview which showed possible ischemia. Echo with severe AS, normal LV function. Cardiac cath on 03/17/13 with mild non-obstructive disease and patent stent RCA. TEE with moderate AS. Most recent echo 08/10/14 with normal LV function, severe AS with mean gradient of 50 mmHg, peak gradient 77 mmHg. She has had worsening of dyspnea with exertion. She cannot walk across the room without severe dyspnea. Right and left heart cath to assess pressures, exclude progression of CAD.                                 Procedure Details: The risks, benefits, complications, treatment options, and expected outcomes were discussed with the patient. The patient and/or family concurred with the proposed plan, giving informed consent. The patient was brought to the cath lab after IV hydration was begun and oral premedication was given. The patient was further sedated with Versed  and Fentanyl. Due to her morbid obesity, our initial plan was to perform right heart cath from the right antecubital vein and her left heart cath from the right radial artery. There was a IV line present in the right antecubital vein. This area was prepped and draped.  I could not advance a wire through the IV catheter. I then prepped the right groin and placed a 7 French sheath in the right femoral vein. Right heart cath was performed with a balloon tipped catheter. I then prepped the right wrist. 1% lidocaine used for local anesthesia. A 5 French sheath was placed in the right radial artery. 3 mg Verapamil given through the sheath. 6000 Units IV heparin through sheath. Standard diagnostic catheters were used to perform selective coronary angiography. I was never able to fully engage the RCA due to ostial stenosis and instability of the catheter due to the high velocity jet in the aorta from her AS. I was able to cross the aortic valve with a AL-1 catheter and long straight wire. Pressures were measured. There were no immediate complications. The patient was taken to the recovery area in stable condition.   Hemodynamic Findings: Ao: 136/80              LV: 182/15/28 RA: 6             RV: 50/10/16 PA: 42/28 (mean 35)       PCWP: 23  Fick Cardiac Output: 4.99 L/min Fick Cardiac Index: 2.1  L/min/m2 Central Aortic Saturation: 97% Pulmonary Artery Saturation: 59%  Aortic Valve Data: Peak to peak gradient 46 mmHg  Mean gradient 12 mmHg   Angiographic Findings:   Left main: No obstructive disease.   Left Anterior Descending Artery: Large caliber vessel that courses to the apex. There is a moderate caliber diagonal branch. No obstructive disease noted.   Circumflex Artery: Large caliber vessel with two small obtuse marginal branches and a moderate caliber posterolateral branch. No obstructive disease noted.   Right Coronary Artery: Large dominant vessel with 70-80% ostial stenosis. 40% proximal  stenosis. Distal stented segment is patent with no restenosis.   Left Ventricular Angiogram: Deferred.   Impression: 1. Single vessel CAD with ostial RCA stenosis, patent distal RCA stent 2. Severe aortic valve stenosis 3. Morbid obesity  Recommendations: She has progression of her CAD with ostial RCA stenosis and severe AS. She will need AVR and bypass of RCA. I will refer her to see one of our CT surgeons. She will be high risk for traditional AVR due to size. May have to consider TAVR/PCI of RCA.        Complications:  None; patient tolerated the procedure well.

## 2014-09-14 NOTE — Interval H&P Note (Signed)
History and Physical Interval Note:  09/14/2014 8:42 AM  [name]  has presented today for cardiac cath with the diagnosis of severe aortic stenosis  The various methods of treatment have been discussed with the patient and family. After consideration of risks, benefits and other options for treatment, the patient has consented to  Procedure(s): LEFT AND RIGHT HEART CATHETERIZATION WITH CORONARY/GRAFT ANGIOGRAM (N/A) as a surgical intervention .  The patient's history has been reviewed, patient examined, no change in status, stable for surgery.  I have reviewed the patient's chart and labs.  Questions were answered to the patient's satisfaction.    Cath Lab Visit (complete for each Cath Lab visit)  Clinical Evaluation Leading to the Procedure:   ACS: No.  Non-ACS:    Anginal Classification: CCS III  Anti-ischemic medical therapy: No Therapy  Non-Invasive Test Results: No non-invasive testing performed  Prior CABG: No previous CABG         MCALHANY,CHRISTOPHER

## 2014-09-14 NOTE — H&P (View-Only) (Signed)
History of Present Illness: 54 yo WF with history of CAD, OSA, HTN, HLD, obesity, GERD, asthma, COPD, former tobacco abuse, depression here today for cardiac follow up. She has been followed in the past in [facility]. I saw her for the first time in December 2012. She had a cardiac cath in February 2010 and had a drug eluting stent (2.34mm Xience) placed in the distal RCA per Dr. Melvern Banker. Her LAD and Circumflex were free of disease. She had her right knee replaced in February 8,2013. She developed SOB on 02/08/12 and was found to have a PE and pneumonia. She was started on coumadin and treated for 6 months. CTA chest on 08/09/12 showed no evidence of PE so her coumadin was stopped. I saw her February 20,2014 and she c/o severe dyspnea with minimal exertion. I felt that this was multi-factorial. Chest CTA with no PE. I arranged a stress myoview which showed possible ischemia. Echo with severe AS, normal LV function. Cardiac cath on 03/17/13 with mild non-obstructive disease and patent stent RCA. TEE with moderate AS. Most recent echo  08/10/14 with normal LV function, severe AS with mean gradient of 50 mmHg, peak gradient 77 mmHg.   She is here today for cardiac follow up. She describes recent worsening of dyspnea with exertion. She also describes chest pain and dizziness. This has all progressed over the last 2-3 weeks. She cannot walk across the room without becoming dyspneic. Her weight has been stable. She does not smoke. She denies syncope.  .  Primary Care Physician: Jana Hakim   Last Lipid Profile:Lipid Panel     Component Value Date/Time   CHOL 191 07/06/2014 1129   TRIG 106.0 07/06/2014 1129   HDL 53.50 07/06/2014 1129   CHOLHDL 4 07/06/2014 1129   VLDL 21.2 07/06/2014 1129   LDLCALC 116* 07/06/2014 1129     Past Medical History  Diagnosis Date  . Asthma   . Hypertension   . Hyperlipidemia   . GERD (gastroesophageal reflux disease)   . Depression   . Aortic  stenosis, moderate     per 11/2011 echo  . Blood transfusion     2013[facility]  . Headache(784.0)     h/o migraines  . Pulmonary embolism 02/08/12    S/P RT TOTAL KNEE ON 02/03/12--ON 02/08/12--DEVELOPED ACUTE SOB AND CHEST PAIN--AND DIAGNOSED WITH  PULMONARY EMBOLUS AND PNEUMONIA  . Restless leg syndrome   . Myocardial infarction     PT THINKS SHE WAS DX WITH MI AT THE TIME OF HEART STENTING  . CAD (coronary artery disease)     Cath 2010 with DES x 1 RCA-- PT'S CARDIOLOGIST IS DR. [place]  . Uterine fibroid     NO PROBLEMS AT PRESENT FROM THE FIBROIDS-STATES SHE IS POST MENOPAUSAL-LAST MENSES 2010 EXCEPT FOR EPISODE THIS YR OF BLEEDING RELATED TO FIBROIDS.  Marland Kitchen Heart murmur   . Pneumonia 02/08/12    HOSPITALIZED AT [facility] WITH PNEUMONIA AND WITH PULMONARY EMBOLUS  . Diabetes mellitus DIAGNOSED IN2010    ORAL MEDS  . Arthritis     PAIN AND SEVERE OA LEFT KNEE ; S/P RIGHT TKA ON 02/03/12; HAS LOWER BACK PAIN-UNABLE TO STAND MORE THAN 10 MIN; ARTHRITIS "ALL OVER"  . Neck pain   . Weakness     BOTH HANDS - S/P BILATERAL CARPAL TUNNEL RELEASE--BUT STILL HAS WEAKNESS--OFTEN DROPS THINGS  . Shortness of breath     with exertion  . Eczema  on back  . Kidney stones   . Anemia   . COPD (chronic obstructive pulmonary disease)   . Sleep apnea     uses 3 liters O2 at night     Past Surgical History  Procedure Laterality Date  . Tubal ligation    . Carpal tunnel release      Bilateral  . Cholecystectomy    . Hernia repair    . Cardiac catheterization    . Knee arthroplasty  02/03/2012    Procedure: COMPUTER ASSISTED TOTAL KNEE ARTHROPLASTY;  Surgeon: Mcarthur Rossetti, MD;  Location: [place];  Service: Orthopedics;  Laterality: Right;  Right total knee arthroplasty  . Coronary angioplasty      2010 has stent in place  . Total knee arthroplasty  09/10/2012    Procedure: TOTAL KNEE ARTHROPLASTY;  Surgeon: Mcarthur Rossetti, MD;  Location: [facility];  Service: Orthopedics;  Laterality: Left;    . Trigger finger release  09/10/2012    Procedure: RELEASE TRIGGER FINGER/A-1 PULLEY;  Surgeon: Mcarthur Rossetti, MD;  Location: [facility];  Service: Orthopedics;  Laterality: Right;  Right Ring Finger  . Tee without cardioversion N/A 03/14/2013    Procedure: TRANSESOPHAGEAL ECHOCARDIOGRAM (TEE);  Surgeon: Lelon Perla, MD;  Location: [facility];  Service: Cardiovascular;  Laterality: N/A;  . Joint replacement      bil total knees  . Total knee revision Right 07/15/2013    Procedure: REVISION ARTHROPLASTY RIGHT KNEE;  Surgeon: Mcarthur Rossetti, MD;  Location: [facility];  Service: Orthopedics;  Laterality: Right;  . Cystoscopy w/ retrogrades Right 09/21/2013    Procedure: CYSTOSCOPY WITH RIGHT RETROGRADE PYELOGRAM RIGHT DOUBLE J STENT ;  Surgeon: Fredricka Bonine, MD;  Location: [facility];  Service: Urology;  Laterality: Right;  . Cystoscopy with ureteroscopy and stent placement Right 10/25/2013    Procedure: CYSTOSCOPY RIGHT URETEROSCOPY HOLMIUM LASER LITHO AND STENT PLACEMENT;  Surgeon: Fredricka Bonine, MD;  Location: [facility];  Service: Urology;  Laterality: Right;    Current Outpatient Prescriptions  Medication Sig Dispense Refill  . albuterol (PROVENTIL HFA;VENTOLIN HFA) 108 (90 BASE) MCG/ACT inhaler Inhale 2 puffs into the lungs every 4 (four) hours as needed for wheezing or shortness of breath (((PLAN B))).       Marland Kitchen albuterol (PROVENTIL) (2.5 MG/3ML) 0.083% nebulizer solution Take 2.5 mg by nebulization every 4 (four) hours as needed for wheezing or shortness of breath (((PLAN C))).       Marland Kitchen aspirin EC 325 MG tablet Take 325 mg by mouth 2 (two) times daily.      Marland Kitchen atorvastatin (LIPITOR) 80 MG tablet TAKE ONE TABLET BY MOUTH ONCE DAILY AT BEDTIME  30 tablet  0  . budesonide-formoterol (SYMBICORT) 160-4.5 MCG/ACT inhaler Inhale 2 puffs into the lungs 2 (two) times daily.      . clopidogrel (PLAVIX) 75 MG tablet Take 75 mg by mouth every morning.       . escitalopram  (LEXAPRO) 20 MG tablet Take 20 mg by mouth every morning.       . furosemide (LASIX) 40 MG tablet Take 40 mg by mouth 2 (two) times daily.      Marland Kitchen gabapentin (NEURONTIN) 300 MG capsule Take 300 mg by mouth 2 (two) times daily.       Marland Kitchen glipiZIDE (GLUCOTROL) 5 MG tablet Take 5 mg by mouth daily before supper.       . hydrochlorothiazide (HYDRODIURIL) 25 MG tablet Take 25 mg by mouth every morning.       Marland Kitchen  KLOR-CON M20 20 MEQ tablet TAKE 2 TABLETS BY MOUTH DAILY AFTER BREAKFAST  60 tablet  3  . losartan (COZAAR) 100 MG tablet Take 100 mg by mouth daily with breakfast.       . MAGNESIUM PO Take 1-2 tablets by mouth as needed (constipation).      . Multiple Vitamin (MULTIVITAMIN) tablet Take 1 tablet by mouth daily.      Donell Sievert IN Inhale into the lungs.      Marland Kitchen ZETIA 10 MG tablet TAKE ONE TABLET BY MOUTH ONCE DAILY IN THE EVENING  30 tablet  3   No current facility-administered medications for this visit.    No Known Allergies  History   Social History  . Marital Status: Married    Spouse Name: N/A    Number of Children: 2  . Years of Education: N/A   Occupational History  . Disabled    Social History Main Topics  . Smoking status: Former Smoker -- 1.50 packs/day for 30 years    Types: Cigarettes    Quit date: 12/29/2000  . Smokeless tobacco: Never Used  . Alcohol Use: No  . Drug Use: No  . Sexual Activity: Yes    Birth Control/ Protection: Surgical   Other Topics Concern  . Not on file   Social History Narrative  . No narrative on file    Family History  Problem Relation Age of Onset  . Breast cancer Mother   . COPD Father     smoked  . Cancer Brother     Sinus  . Emphysema Mother     smoked  . Asthma Father   . Heart disease Mother   . Heart disease Father     Review of Systems:  As stated in the HPI and otherwise negative.   BP 135/73  Pulse 96  Ht 5\' 3"  (1.6 m)  Wt 354 lb (160.573 kg)  BMI 62.72 kg/m2  SpO2 96%  LMP 07/26/2009  Physical  Examination: General: Well developed, well nourished, NAD HEENT: OP clear, mucus membranes moist SKIN: warm, dry. No rashes. Neuro: No focal deficits Musculoskeletal: Muscle strength 5/5 all ext Psychiatric: Mood and affect normal Neck: No JVD, no carotid bruits, no thyromegaly, no lymphadenopathy. Lungs:Clear bilaterally, no wheezes, rhonci, crackles Cardiovascular: Regular rate and rhythm. Loud systolic murmur.  Abdomen:Soft. Bowel sounds present. Non-tender.  Extremities: No lower extremity edema. Pulses are 2 + in the bilateral DP/PT.  Cardiac cath 03/17/13:  Hemodynamic Findings:  Ao: 125/77  LV: 156/14/21  RA: 18  RV: 48/13/16  PA: 48/17 (mean 36)  PCWP: 20  Fick Cardiac Output: 5.26 L/min  Fick Cardiac Index: 2.2 L/min/m2  Central Aortic Saturation: 92%  Pulmonary Artery Saturation: 57%  Aortic Valve Data: Peak to peak gradient 31 mmHg  Mean gradient 22 mmHg  AVA 1.15 cm2  Angiographic Findings:  Left main: No obstructive disease.  Left Anterior Descending Artery: Large caliber vessel that courses to the apex. There is a moderate caliber diagonal branch. No obstructive disease noted.  Circumflex Artery: Large caliber vessel with two small obtuse marginal branches and a moderate caliber posterolateral branch. No obstructive disease noted.  Right Coronary Artery: Large, dominant vessel with 40% proximal stenosis, 40% mid stenosis, patent stent distal vessel with no restenosis noted. The PDA and posterolateral branches are patent with no obstructive disease.   Echo 08/10/14: Left ventricle: The cavity size was normal. Wall thickness was normal. Systolic function was normal. The estimated ejection fraction was in  the range of 60% to 65%. Doppler parameters are consistent with abnormal left ventricular relaxation (grade 1 diastolic dysfunction). - Aortic valve: AV is difficult to see well From right upper parasternal window peak and mean gradients through the valve are 77  and 50 mm Hg respectively consistent with severe/critical AS. This is increased from echo of 2014.  CTA chest 02/21/13:  No evidence of pulmonary embolism. 8 mm patchy/nodular opacity in the superior segment right lower lobe, favored to be infectious/inflammatory. Follow-up CT chest is suggested in 3 months to assess for persistence. Very mild additional infectious/inflammatory opacities in the right lung, as described above.   Assessment and Plan:   1. Dyspnea: Worsened. Felt to be multi-factorial due to morbid obesity, deconditioning, severe aortic stenosis, CAD, asthma or chronic thromboembolic disease. Clearly her AS is severe. She will be a high risk candidate for AVR but will have to pursue this. Will review her case with our CT surgeons and get an idea if she could be considered for traditional surgery. TAVR may be an option.   2. CAD: Cardiac cath March 2014 with mild non-obstructive CAD. Will continue ASA, Plavix, statin and ARB. No beta blocker secondary to asthma/COPD. Will plan right and left heart cath as part of pre-AVR workup. Also recent chest pains. Stop HCTZ.   3. Aortic valve stenosis: Severe by Echo August 2015. She is now having worsened dyspnea, chest pain and dizziness. Will plan right and left heart cath next week on 09/14/14. Risks and benefits reviewed with pt. After cath, will need referral for AVR. Her weight will make her high risk for a traditional AVR   4. H/O Pulmonary embolism: No residual PE on CTA 02/21/13. She has been off of coumadin.   5. Hyperlipidemia: Followed in lipid clinic. LDL 116 July 2015.

## 2014-09-19 ENCOUNTER — Telehealth: Payer: Self-pay | Admitting: Cardiovascular Disease

## 2014-09-19 NOTE — Telephone Encounter (Signed)
Spoke with pt and gave her appt date and time with Dr. Cyndia Bent. Also given address and phone number for Dr. Vivi Martens office. She will contact his office to update her phone number and confirm appt

## 2014-09-19 NOTE — Telephone Encounter (Signed)
New message    Patient calling checking on the status of surgeon referral.

## 2014-09-27 ENCOUNTER — Institutional Professional Consult (permissible substitution) (INDEPENDENT_AMBULATORY_CARE_PROVIDER_SITE_OTHER): Payer: Medicaid Other | Admitting: Surgery

## 2014-09-27 ENCOUNTER — Encounter: Payer: Self-pay | Admitting: Surgery

## 2014-09-27 VITALS — BP 141/76 | HR 84 | Resp 16 | Ht 60.0 in | Wt 354.0 lb

## 2014-09-27 DIAGNOSIS — I2510 Atherosclerotic heart disease of native coronary artery without angina pectoris: Secondary | ICD-10-CM

## 2014-09-27 DIAGNOSIS — I350 Nonrheumatic aortic (valve) stenosis: Secondary | ICD-10-CM

## 2014-09-27 DIAGNOSIS — I359 Nonrheumatic aortic valve disorder, unspecified: Secondary | ICD-10-CM

## 2014-09-29 ENCOUNTER — Other Ambulatory Visit: Payer: Self-pay | Admitting: *Deleted

## 2014-09-29 ENCOUNTER — Encounter: Payer: Self-pay | Admitting: Surgery

## 2014-09-29 DIAGNOSIS — I350 Nonrheumatic aortic (valve) stenosis: Secondary | ICD-10-CM

## 2014-09-30 ENCOUNTER — Encounter: Payer: Self-pay | Admitting: Surgery

## 2014-09-30 NOTE — Progress Notes (Signed)
Patient ID: [name], female   DOB: 04-Aug-1961, 53 y.o.   MRN: [id number]  [facility] CONSULTATION REPORT  Referring Provider is Burnell Blanks* PCP is Theressa Millard, MD  Chief Complaint  Patient presents with  . Aortic Stenosis    consult for surgery  vs. TAVR  . Coronary Artery Disease    HPI:  The patient is a 53 year old woman with a history of morbid obesity, asthma and COPD, OSA, HTN, and hyperlipidemia who has know coronary disease who had a drug eluting stent placed in the distal RCA in 01/2009. She had no disease in the LAD or LCX at that time. She had a right total knee replacement in 01/2012 and developed shortness of breath postop. She was diagnosed with a PE and pneumonia and was treated with Coumadin for 6 months. She had a CTA of the chest on 08/09/2012 to follow up on the PE because she was having chronic shortness of breath. This showed no PE and the coumadin was stopped. In Feb 2014 she was complaining of severe dyspnea with minimal exertion and a CTA of the chest was repeated and showed no PE. She had a stress myoview that showed possible ischemia. An echo showed severe AS with a mean gradient of 40 mm Hg and a peak of 57 mm Hg. The AVA was 0.74 cm2. LV function was normal. Cardiac cath on 03/17/2013 showed a patent stent in the RCA and otherwise mild non-obstructive disease. A TEE was done that showed moderate AS with a mean gradient of 29 mm Hg. The AVA by planimetry was 1.2-1.4 cm. Her most recent echo on 08/10/2014 shows normal LV function with severe AS with a mean gradient of 50 mm Hg and a peak of 77 mm Hg. She was seen by Dr. Angelena Form complaining of a several week history of of worsening dyspnea with exertion. She says she can only take a few steps before she gets short of breath and has to sit down. She can usually walk with a cane but has been using a wheelchair recently.  She also reports some chest pains and dizziness. She underwent a cath on 09/14/2014 which showed a peak to peak gradient across the aortic valve of 46 mm Hg. The RCA had some ostial stenosis estimated at 70-80% followed by a 40% proximal stenosis. The distal stented segment is patent with no restenosis. There was mild pulmonary HTN with a PAP of 42/28 with a CI of 2.1.   Past Medical History  Diagnosis Date  . Asthma   . Hypertension   . Hyperlipidemia   . GERD (gastroesophageal reflux disease)   . Depression   . Blood transfusion     2013[facility]  . Headache(784.0)     h/o migraines  . Pulmonary embolism 02/08/12    S/P RT TOTAL KNEE ON 02/03/12--ON 02/08/12--DEVELOPED ACUTE SOB AND CHEST PAIN--AND DIAGNOSED WITH  PULMONARY EMBOLUS AND PNEUMONIA  . Restless leg syndrome   . Myocardial infarction     PT THINKS SHE WAS DX WITH MI AT THE TIME OF HEART STENTING  . CAD (coronary artery disease)     Cath 2010 with DES x 1 RCA-- PT'S CARDIOLOGIST IS DR. [place]  . Uterine fibroid     NO PROBLEMS AT PRESENT FROM THE FIBROIDS-STATES SHE IS POST MENOPAUSAL-LAST MENSES 2010 EXCEPT FOR EPISODE THIS YR OF BLEEDING RELATED TO FIBROIDS.  Marland Kitchen Heart murmur   .  Pneumonia 02/08/12    HOSPITALIZED AT [facility] WITH PNEUMONIA AND WITH PULMONARY EMBOLUS  . Diabetes mellitus DIAGNOSED IN2010    ORAL MEDS  . Arthritis     PAIN AND SEVERE OA LEFT KNEE ; S/P RIGHT TKA ON 02/03/12; HAS LOWER BACK PAIN-UNABLE TO STAND MORE THAN 10 MIN; ARTHRITIS "ALL OVER"  . Neck pain   . Weakness     BOTH HANDS - S/P BILATERAL CARPAL TUNNEL RELEASE--BUT STILL HAS WEAKNESS--OFTEN DROPS THINGS  . Shortness of breath     with exertion  . Eczema     on back  . Kidney stones   . Anemia   . COPD (chronic obstructive pulmonary disease)   . Sleep apnea     uses 3 liters O2 at night   . Aortic valve stenosis, severe     Past Surgical History  Procedure Laterality Date  . Tubal ligation    . Carpal tunnel release      Bilateral  .  Cholecystectomy    . Hernia repair    . Cardiac catheterization    . Knee arthroplasty  02/03/2012    Procedure: COMPUTER ASSISTED TOTAL KNEE ARTHROPLASTY;  Surgeon: Mcarthur Rossetti, MD;  Location: [place];  Service: Orthopedics;  Laterality: Right;  Right total knee arthroplasty  . Coronary angioplasty      2010 has stent in place  . Total knee arthroplasty  09/10/2012    Procedure: TOTAL KNEE ARTHROPLASTY;  Surgeon: Mcarthur Rossetti, MD;  Location: [facility];  Service: Orthopedics;  Laterality: Left;  . Trigger finger release  09/10/2012    Procedure: RELEASE TRIGGER FINGER/A-1 PULLEY;  Surgeon: Mcarthur Rossetti, MD;  Location: [facility];  Service: Orthopedics;  Laterality: Right;  Right Ring Finger  . Tee without cardioversion N/A 03/14/2013    Procedure: TRANSESOPHAGEAL ECHOCARDIOGRAM (TEE);  Surgeon: Lelon Perla, MD;  Location: [facility] ENDOSCOPY;  Service: Cardiovascular;  Laterality: N/A;  . Joint replacement      bil total knees  . Total knee revision Right 07/15/2013    Procedure: REVISION ARTHROPLASTY RIGHT KNEE;  Surgeon: Mcarthur Rossetti, MD;  Location: [facility];  Service: Orthopedics;  Laterality: Right;  . Cystoscopy w/ retrogrades Right 09/21/2013    Procedure: CYSTOSCOPY WITH RIGHT RETROGRADE PYELOGRAM RIGHT DOUBLE J STENT ;  Surgeon: Fredricka Bonine, MD;  Location: [facility];  Service: Urology;  Laterality: Right;  . Cystoscopy with ureteroscopy and stent placement Right 10/25/2013    Procedure: CYSTOSCOPY RIGHT URETEROSCOPY HOLMIUM LASER LITHO AND STENT PLACEMENT;  Surgeon: Fredricka Bonine, MD;  Location: [facility];  Service: Urology;  Laterality: Right;    Family History  Problem Relation Age of Onset  . Breast cancer Mother   . COPD Father     smoked  . Cancer Brother     Sinus  . Emphysema Mother     smoked  . Asthma Father   . Heart disease Mother   . Heart disease Father     History   Social History  . Marital Status: Married    Spouse  Name: N/A    Number of Children: 2  . Years of Education: N/A   Occupational History  . Disabled    Social History Main Topics  . Smoking status: Former Smoker -- 1.50 packs/day for 30 years    Types: Cigarettes    Quit date: 12/29/2000  . Smokeless tobacco: Never Used  . Alcohol Use: No  . Drug Use: No  . Sexual  Activity: Yes    Birth Control/ Protection: Surgical   Other Topics Concern  . Not on file   Social History Narrative  . No narrative on file    Current Outpatient Prescriptions  Medication Sig Dispense Refill  . albuterol (PROVENTIL HFA;VENTOLIN HFA) 108 (90 BASE) MCG/ACT inhaler Inhale 2 puffs into the lungs every 4 (four) hours as needed for wheezing or shortness of breath (((PLAN B))).       Marland Kitchen albuterol (PROVENTIL) (2.5 MG/3ML) 0.083% nebulizer solution Take 2.5 mg by nebulization every 4 (four) hours as needed for wheezing or shortness of breath (((PLAN C))).       Marland Kitchen aspirin EC 325 MG tablet Take 325 mg by mouth 2 (two) times daily.      Marland Kitchen atorvastatin (LIPITOR) 80 MG tablet Take 80 mg by mouth daily.      . budesonide-formoterol (SYMBICORT) 160-4.5 MCG/ACT inhaler Inhale 2 puffs into the lungs 2 (two) times daily.      . clopidogrel (PLAVIX) 75 MG tablet Take 75 mg by mouth every morning.       . escitalopram (LEXAPRO) 20 MG tablet Take 20 mg by mouth every morning.       . ezetimibe (ZETIA) 10 MG tablet Take 10 mg by mouth daily.      . furosemide (LASIX) 40 MG tablet Take 40 mg by mouth 2 (two) times daily.      Marland Kitchen gabapentin (NEURONTIN) 300 MG capsule Take 300 mg by mouth 2 (two) times daily.       Marland Kitchen glipiZIDE (GLUCOTROL) 5 MG tablet Take 5 mg by mouth daily before supper.       . losartan (COZAAR) 100 MG tablet Take 100 mg by mouth daily with breakfast.       . MAGNESIUM PO Take 1-2 tablets by mouth daily as needed (constipation).       . Multiple Vitamin (MULTIVITAMIN) tablet Take 1 tablet by mouth daily.      Donell Sievert IN Inhale into the lungs.        . potassium chloride SA (K-DUR,KLOR-CON) 20 MEQ tablet Take 40 mEq by mouth daily.       No current facility-administered medications for this visit.    No Known Allergies    Review of Systems:   General:  normal appetite, marked decreased energy, gradual weight gain, no weight loss, no fever  Cardiac:  intermittent chest pain with exertion, no chest pain at rest, marked SOB with minimal exertion,  resting SOB, no PND, some orthopnea, no palpitations, no arrhythmia, no atrial fibrillation, marked LE edema, some dizzy spells, no syncope  Respiratory:  marked shortness of breath, on home oxygen, no productive cough, no dry cough, no bronchitis, frequent wheezing, no hemoptysis, has asthma, no pain with inspiration or cough, has sleep apnea, uses CPAP at night  GI:   no difficulty swallowing, has reflux, no frequent heartburn, has hiatal hernia, no abdominal pain, no constipation, no diarrhea, no hematochezia, no hematemesis, no melena  GU:   no dysuria,  no frequency, no urinary tract infection, no hematuria, no kidney stones, no kidney disease  Vascular:  no pain suggestive of claudication, no pain in feet, no leg cramps, no varicose veins, no DVT, no non-healing foot ulcer  Neuro:   no stroke, no TIA's, no seizures, no headaches, notemporary blindness one eye,  no slurred speech, no peripheral neuropathy, no chronic pain, has instability of gait, no memory/cognitive dysfunction  Musculoskeletal: has arthritis, has joint swelling, no  myalgias, has difficulty walking, decreased mobility   Skin:   no rash, no itching, no skin infections, no pressure sores or ulcerations  Psych:   has anxiety, has depression, has nervousness, no unusual recent stress  Eyes:   no blurry vision, no floaters, no recent vision changes,  wears glasses or contacts  ENT:   no hearing loss, no loose or painful teeth, partial dentures, last saw dentist 6 months ago  Hematologic:  no easy bruising, no abnormal bleeding,  no clotting disorder, no frequent epistaxis  Endocrine:  has diabetes        Physical Exam:   BP 141/76  Pulse 84  Resp 16  Ht 5' (1.524 m)  Wt 354 lb (160.573 kg)  BMI 69.14 kg/m2  SpO2 96%  LMP 07/26/2009  General:  Morbidly obese chronically ill-appearing  HEENT:  Unremarkable , NCAT, PERLA, EOMI, multiple missing teeth  Neck:   no JVD, no bruits, no adenopathy or thyromegaly but neck is very thick  Chest:   clear to auscultation, symmetrical breath sounds, no wheezes, no rhonchi   CV:   RRR, grade III/VI crescendo/decrescendo murmur heard best at RSB,  no diastolic murmur  Abdomen:  soft, non-tender, no masses , morbidly obese  Extremities:  warm, well-perfused, pulses not palpable, moderate LE edema  Rectal/GU  Deferred  Neuro:   Grossly non-focal and symmetrical throughout  Skin:   Clean and dry, no rashes, no breakdown   Diagnostic Tests:  [facility] [address],  [id number] [phone]  ------------------------------------------------------------------- Transthoracic Echocardiography  Patient: [name], [name] MR #: [id number] Study Date: 08/10/2014 Gender: F Age: 19 Height: 160 cm Weight: 159.7 kg BSA: 2.78 m^2 Pt. Status: Room:  ATTENDING Darlina Guys, MD SONOGRAPHER Cindy Hazy, RDCS ORDERING [place], [place], Outpatient  cc:  ------------------------------------------------------------------- LV EF: 60% - 65%  ------------------------------------------------------------------- Indications: 424.1 Aortic valve disorders.  ------------------------------------------------------------------- History: PMH: Acquired from the patient and from the patient&'s chart. PMH: History of Pulmonary Embolism. CAD. COPD. Obstructive Sleep Apnea. Shortness of breath. Pneumonia. Murmur. Risk factors: Former tobacco use. Hypertension. Diabetes mellitus. Morbidly obese.  Dyslipidemia.  ------------------------------------------------------------------- Study Conclusions  - Left ventricle: The cavity size was normal. Wall thickness was normal. Systolic function was normal. The estimated ejection fraction was in the range of 60% to 65%. Doppler parameters are consistent with abnormal left ventricular relaxation (grade 1 diastolic dysfunction). - Aortic valve: AV is difficult to see well From right upper parasternal window peak and mean gradients through the valve are 77 and 50 mm Hg respectively consistent with severe/critical AS. This is increased from echo of 2014.  Transthoracic echocardiography. M-mode, complete 2D, spectral Doppler, and color Doppler. Birthdate: Patient birthdate: 04/25/1961. Age: Patient is 53 yr old. Sex: Gender: female. BMI: 62.4 kg/m^2. Blood pressure: 120/70 Patient status: Outpatient. Study date: Study date: 08/10/2014. Study time: 01:23 PM. Location:  [facility]  -------------------------------------------------------------------  ------------------------------------------------------------------- Left ventricle: The cavity size was normal. Wall thickness was normal. Systolic function was normal. The estimated ejection fraction was in the range of 60% to 65%. Doppler parameters are consistent with abnormal left ventricular relaxation (grade 1 diastolic dysfunction).  ------------------------------------------------------------------- Aortic valve: AV is difficult to see well From right upper parasternal window peak and mean gradients through the valve are 77 and 50 mm Hg respectively consistent with severe/critical AS. This is increased from echo of 2014. Mildly thickened, mildly calcified leaflets. Doppler: There was no significant regurgitation. VTI ratio of  LVOT to aortic valve: 0.34. Valve area (VTI): 1.07 cm^2. Indexed valve area (VTI): 0.39 cm^2/m^2. Valve area (Vmax): 1.02 cm^2. Indexed valve area (Vmax):  0.37 cm^2/m^2. Mean gradient (S): 52 mm Hg. Peak gradient (S): 77 mm Hg.  ------------------------------------------------------------------- Mitral valve: Calcified annulus. Mildly thickened leaflets . Doppler: There was trivial regurgitation. Peak gradient (D): 5 mm Hg.  ------------------------------------------------------------------- Right ventricle: The cavity size was normal. Wall thickness was normal. Systolic function was normal.  ------------------------------------------------------------------- Right atrium: The atrium was normal in size.  ------------------------------------------------------------------- Pericardium: There was no pericardial effusion.  ------------------------------------------------------------------- Systemic veins: Inferior vena cava: The vessel was mildly dilated. The respirophasic diameter changes were in the normal range (= 50%), consistent with normal central venous pressure.  ------------------------------------------------------------------- Measurements  Left ventricle Value 03/14/2013 Reference LV ID, ED, PLAX 52 mm ---------- 43 - 52 chordal LV ID, ES, PLAX 29 mm ---------- 23 - 38 chordal LV fx shortening, 44 % ---------- >=29 PLAX chordal LV PW thickness, ED 10 mm ---------- --------- IVS/LV PW ratio, ED 1 ---------- <=1.3 Stroke volume, 2D 91 ml ---------- --------- Stroke volume/bsa, 33 ml/m^2 ---------- --------- 2D LV e&', lateral 8.7 cm/s ---------- --------- LV E/e&', lateral 12.3 ---------- --------- LV e&', medial 7.4 cm/s ---------- --------- LV E/e&', medial 14.46 ---------- --------- LV e&', average 8.05 cm/s ---------- --------- LV E/e&', average 13.29 ---------- ---------  Ventricular septum Value 03/14/2013 Reference IVS thickness, ED 10 mm ---------- ---------  LVOT Value 03/14/2013 Reference LVOT ID, S 20 mm ---------- --------- LVOT area 3.14 cm^2 ---------- --------- LVOT VTI, S 29.1 cm ----------  ---------  Aortic valve Value 03/14/2013 Reference Aortic valve peak 438 cm/s 320 --------- velocity, S Aortic valve mean 329 cm/s 235 --------- velocity, S Aortic valve VTI, S 85.2 cm 79.5 --------- Aortic mean 52 mm Hg 29 --------- gradient, S Aortic peak 77 mm Hg 41 --------- gradient, S VTI ratio, LVOT/AV 0.34 ---------- --------- Aortic valve area, 1.07 cm^2 ---------- --------- VTI Aortic valve 0.39 cm^2/m^2 ---------- --------- area/bsa, VTI Aortic valve area, 1.02 cm^2 ---------- --------- peak velocity Aortic valve 0.37 cm^2/m^2 ---------- --------- area/bsa, peak velocity  Aorta Value 03/14/2013 Reference Aortic root ID, ED 25 mm ---------- ---------  Left atrium Value 03/14/2013 Reference LA ID, A-P, ES 40 mm ---------- --------- LA ID/bsa, A-P 1.44 cm/m^2 ---------- <=2.2  Mitral valve Value 03/14/2013 Reference Mitral E-wave peak 107 cm/s ---------- --------- velocity Mitral A-wave peak 130 cm/s ---------- --------- velocity Mitral deceleration 218 ms ---------- 150 - 230 time Mitral peak 5 mm Hg ---------- --------- gradient, D Mitral E/A ratio, 0.8 ---------- --------- peak  Right ventricle Value 03/14/2013 Reference RV s&', lateral, S 22.7 cm/s ---------- ---------  Legend: (L) and (H) mark values outside specified reference range.  ------------------------------------------------------------------- Prepared and Electronically Authenticated by  Dorris Carnes, M.D. 2015-08-13T16:57:06   Cardiac Catheterization Operative Report  [name]  [id number]  9/17/20159:55 AM  Theressa Millard, MD  Procedure Performed:  1. Left Heart Catheterization 2. Selective Coronary Angiography 3. Right Heart Catheterization Operator: Lauree Chandler, MD  Indication: 53 yo WF with history of CAD, OSA, HTN, HLD, obesity, GERD, asthma, COPD, former tobacco abuse, depression here today for cardiac catheterization. I saw her for the first time in  December 2012. She had a cardiac cath in February 2010 and had a drug eluting stent (2.52mm Xience) placed in the distal RCA per Dr. Melvern Banker. Her LAD and Circumflex were free of disease. She had her right knee replaced in February 8,2013. She developed SOB on  02/08/12 and was found to have a PE and pneumonia. She was started on coumadin and treated for 6 months. CTA chest on 08/09/12 showed no evidence of PE so her coumadin was stopped. I saw her February 20,2014 and she c/o severe dyspnea with minimal exertion. I felt that this was multi-factorial. Chest CTA with no PE. I arranged a stress myoview which showed possible ischemia. Echo with severe AS, normal LV function. Cardiac cath on 03/17/13 with mild non-obstructive disease and patent stent RCA. TEE with moderate AS. Most recent echo 08/10/14 with normal LV function, severe AS with mean gradient of 50 mmHg, peak gradient 77 mmHg. She has had worsening of dyspnea with exertion. She cannot walk across the room without severe dyspnea. Right and left heart cath to assess pressures, exclude progression of CAD.  Procedure Details:  The risks, benefits, complications, treatment options, and expected outcomes were discussed with the patient. The patient and/or family concurred with the proposed plan, giving informed consent. The patient was brought to the cath lab after IV hydration was begun and oral premedication was given. The patient was further sedated with Versed and Fentanyl. Due to her morbid obesity, our initial plan was to perform right heart cath from the right antecubital vein and her left heart cath from the right radial artery. There was a IV line present in the right antecubital vein. This area was prepped and draped. I could not advance a wire through the IV catheter. I then prepped the right groin and placed a 7 French sheath in the right femoral vein. Right heart cath was performed with a balloon tipped catheter. I then prepped the right wrist. 1%  lidocaine used for local anesthesia. A 5 French sheath was placed in the right radial artery. 3 mg Verapamil given through the sheath. 6000 Units IV heparin through sheath. Standard diagnostic catheters were used to perform selective coronary angiography. I was never able to fully engage the RCA due to ostial stenosis and instability of the catheter due to the high velocity jet in the aorta from her AS. I was able to cross the aortic valve with a AL-1 catheter and long straight wire. Pressures were measured. There were no immediate complications. The patient was taken to the recovery area in stable condition.  Hemodynamic Findings:  Ao: 136/80  LV: 182/15/28  RA: 6  RV: 50/10/16  PA: 42/28 (mean 35)  PCWP: 23  Fick Cardiac Output: 4.99 L/min  Fick Cardiac Index: 2.1 L/min/m2  Central Aortic Saturation: 97%  Pulmonary Artery Saturation: 59%  Aortic Valve Data: Peak to peak gradient 46 mmHg  Mean gradient 12 mmHg  Angiographic Findings:  Left main: No obstructive disease.  Left Anterior Descending Artery: Large caliber vessel that courses to the apex. There is a moderate caliber diagonal branch. No obstructive disease noted.  Circumflex Artery: Large caliber vessel with two small obtuse marginal branches and a moderate caliber posterolateral branch. No obstructive disease noted.  Right Coronary Artery: Large dominant vessel with 70-80% ostial stenosis. 40% proximal stenosis. Distal stented segment is patent with no restenosis.  Left Ventricular Angiogram: Deferred.  Impression:  1. Single vessel CAD with ostial RCA stenosis, patent distal RCA stent  2. Severe aortic valve stenosis  3. Morbid obesity  Recommendations: She has progression of her CAD with ostial RCA stenosis and severe AS. She will need AVR and bypass of RCA. I will refer her to see one of our CT surgeons. She will be high risk for traditional AVR  due to size. May have to consider TAVR/PCI of RCA.  Complications: None; patient  tolerated the procedure well.                                                               STS Risk Calculator  Procedure: AV Replacement + CAB Risk of Mortality: 5.335% Morbidity or Mortality: 31.043% Long Length of Stay: 20.208% Short Length of Stay: 14.239% Permanent Stroke: 0.979% Prolonged Ventilation: 26.924% DSW Infection: 3.1% Renal Failure: 9.224% Reoperation: 10.067%    Impression:  She has severe symptomatic aortic stenosis and single vessel coronary disease with NYHA class IV congestive heart failure symptoms and severe shortness of breath with minimal activity like taking a few steps. Her CHF is complicated by morbid obesity, COPD and OSA, and likely severe obstructive and restrictive lung disease. She also has a relatively small annulus on echo and some calcification of her ascending aorta on prior CTA of the chest. I think she would be an extremely high risk candidate for a conventional AVR and CABG. She would probably require a root replacement and would probably still have a significant degree of aortic stenosis due to her small annulus size and the inherent properties of the usual prosthetic valves used for open surgery. I think TAVR would be a reasonable alternative if possible. Her operative risk of mortality as estimated by the STS risk calculator is only 5.3% but I think her actual risk is much higher and certainly greater than 15%. I discussed the alternatives of traditional open surgery, TAVR and palliative medical therapy with her and her son and daughter-in-law, including the benefits and risks. She would like to proceed with TAVR work-up.   Plan:  1. Gated cardiac CT  2. CTA of the chest, abdomen, and pelvis  3. PFT's  4. I will see her back after these are completed to discuss the results and if she is a candidate for TAVR I will have her return to see Dr. Roxy Manns for a second surgical opinion.     Gaye Pollack, MD 09/27/2014

## 2014-10-12 ENCOUNTER — Ambulatory Visit (HOSPITAL_COMMUNITY)
Admission: RE | Admit: 2014-10-12 | Discharge: 2014-10-12 | Disposition: A | Discharge status: Home / Self Care | Payer: Medicaid Other | Source: Ambulatory Visit | Attending: Surgery | Admitting: Surgery

## 2014-10-12 ENCOUNTER — Ambulatory Visit (HOSPITAL_COMMUNITY): Admission: RE | Admit: 2014-10-12 | Payer: Medicaid Other | Source: Ambulatory Visit

## 2014-10-12 ENCOUNTER — Other Ambulatory Visit (HOSPITAL_COMMUNITY): Payer: Self-pay | Admitting: Respiratory Therapy

## 2014-10-12 VITALS — BP 145/53 | HR 73

## 2014-10-12 DIAGNOSIS — I350 Nonrheumatic aortic (valve) stenosis: Secondary | ICD-10-CM

## 2014-10-12 LAB — PULMONARY FUNCTION TEST
DL/VA % PRED: 96 %
DL/VA: 4.36 ml/min/mmHg/L
DLCO COR: 11.95 ml/min/mmHg
DLCO UNC: 11.95 ml/min/mmHg
DLCO cor % pred: 55 %
DLCO unc % pred: 55 %
FEF 25-75 POST: 0.61 L/s
FEF 25-75 Pre: 0.46 L/sec
FEF2575-%Change-Post: 33 %
FEF2575-%PRED-POST: 25 %
FEF2575-%Pred-Pre: 18 %
FEV1-%Change-Post: 8 %
FEV1-%Pred-Post: 43 %
FEV1-%Pred-Pre: 40 %
FEV1-PRE: 1.01 L
FEV1-Post: 1.09 L
FEV1FVC-%Change-Post: 1 %
FEV1FVC-%PRED-PRE: 78 %
FEV6-%Change-Post: 6 %
FEV6-%PRED-POST: 55 %
FEV6-%Pred-Pre: 51 %
FEV6-POST: 1.7 L
FEV6-Pre: 1.6 L
FEV6FVC-%CHANGE-POST: 0 %
FEV6FVC-%Pred-Post: 101 %
FEV6FVC-%Pred-Pre: 101 %
FVC-%CHANGE-POST: 6 %
FVC-%PRED-PRE: 50 %
FVC-%Pred-Post: 54 %
FVC-POST: 1.73 L
FVC-Pre: 1.63 L
PRE FEV1/FVC RATIO: 62 %
Post FEV1/FVC ratio: 63 %
Post FEV6/FVC ratio: 99 %
Pre FEV6/FVC Ratio: 98 %
RV % pred: 222 %
RV: 3.99 L
TLC % pred: 118 %
TLC: 5.62 L

## 2014-10-12 IMAGING — CT CT CTA ABD/PEL W/CM AND/OR W/O CM
1 of 9 series · 1 of 46 positions shown, 3 images · IV contrast (Iodine)
Comparison: CT of the abdomen and pelvis [DATE].

CLINICAL DATA: 53-year-old female with history of severe aortic
stenosis. Preprocedural study for potential transcatheter aortic
valve replacement (TAVR).

EXAM:
CTA ABDOMEN AND PELVIS WITH CONTRAST
TECHNIQUE: Multidetector CT imaging of the abdomen and pelvis was performed
using the standard protocol during bolus administration of
intravenous contrast. Multiplanar reconstructed images and MIPs were
obtained and reviewed to evaluate the vascular anatomy.
CONTRAST:  80mL OMNIPAQUE IOHEXOL 350 MG/ML SOLN

[Series 200: locator · axial · 0.68mm/px · z∈[-112,-112]mm · 1 of 1 slices shown, 3 images]
[im 1/1  soft-tissue]
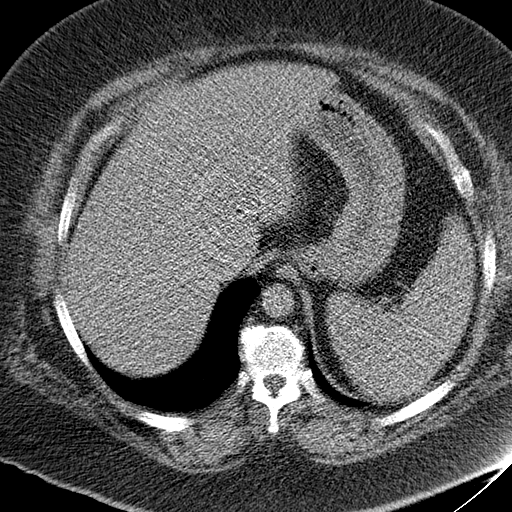
[im 1/1  lung]
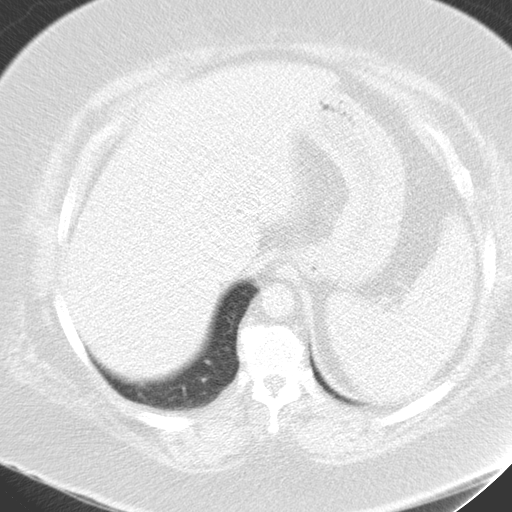
[im 1/1  bone]
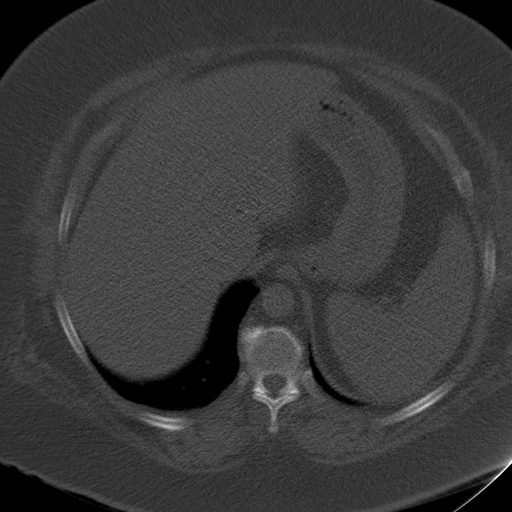

[1 of 46 positions shown; findings below may reference images not displayed]

FINDINGS: CT ABDOMEN AND PELVIS FINDINGS

Lower chest: Minimal scarring or subsegmental atelectasis in lung
bases bilaterally.

Hepatobiliary: No focal hepatic lesion. No intra or extrahepatic
biliary ductal dilatation. Status post cholecystectomy.

Pancreas: Unremarkable.

Spleen: Spleen is enlarged measuring 4.7 x 14.6 x 12.5 cm (estimated
splenic volume of 429 mL).

Adrenals/Urinary Tract: Normal adrenal glands bilaterally. Sub cm
low-attenuation lesions in the kidneys bilaterally are too small to
definitively characterize, but favored to represent tiny cysts. No
hydroureteronephrosis or perinephric stranding to indicate urinary
tract obstruction at this time. Urinary bladder is unremarkable in
appearance.

Stomach/Bowel: The appearance of the stomach is normal. No
pathologic dilatation of the small bowel or colon.

Vascular/Lymphatic: Atherosclerosis throughout the abdominal and
pelvic vasculature, with findings and measurements pertinent to
potential TAVR procedure, as detailed below. No pathologically
enlarged lymph nodes are noted in the abdomen or pelvis.

Reproductive: Uterus and ovaries are unremarkable in appearance.

Other: No significant volume of ascites. No pneumoperitoneum.
Postoperative changes of mesh repair for ventral hernia. Notably,
and in the epigastric region there is a small recurrent ventral
hernia containing only omental fat. Additionally, there are 2 small
ventral hernias below the umbilicus, both of which contain only
omental fat.

Musculoskeletal: There are no aggressive appearing lytic or blastic
lesions noted in the visualized portions of the skeleton.

VASCULAR MEASUREMENTS PERTINENT TO TAVR:

AORTA:

Minimal Aortic Diameter -  10 x 14 mm mm

Severity of Aortic Calcification -  moderate

RIGHT PELVIS:

Right Common Iliac Artery -

Minimal Diameter - 9.5 x 7.6 mm

Tortuosity - mild

Calcification - mild

Right External Iliac Artery -

Minimal Diameter - 7.5 x 5.9 mm

Tortuosity - mild

Calcification - none

Right Common Femoral Artery -

Minimal Diameter - 8.3 x 6.3 mm

Tortuosity - mild

Calcification - mild

LEFT PELVIS:

Left Common Iliac Artery -

Minimal Diameter - 6.4 x 8.6 mm

Tortuosity - mild

Calcification - mild

Left External Iliac Artery -

Minimal Diameter - 7.2 x 7.3 mm

Tortuosity - mild

Calcification - none

Left Common Femoral Artery -

Minimal Diameter - 8.1 x 5.6 mm

Tortuosity - mild

Calcification - mild

Review of the MIP images confirms the above findings.
IMPRESSION: 1. Findings and measurements pertinent to potential TAVR procedure,
as detailed above. This patient does appear to have suitable pelvic
arterial access.
2. Status post mesh repair for ventral hernia in the upper abdomen,
with multiple ventral hernias in the epigastric region and low
anterior abdominal wall, as detailed above, each of which contains
only omental fat.
3. Splenomegaly.
4. Status post cholecystectomy.
5. Additional incidental findings, as above.

## 2014-10-12 IMAGING — CT CT HEART MORP W/ CTA COR W/ SCORE W/ CA W/CM &/OR W/O CM
1 of 17 series · 1 of 20 positions shown, 2 images · non-contrast
Comparison: CT chest [DATE]

CLINICAL DATA: 52-year-old female with severe aortic stenosis.

EXAM:
Cardiac TAVR CT
TECHNIQUE: The patient was scanned on a Philips 256 scanner. A 120 kV
retrospective scan was triggered in the descending thoracic aorta at
111 HU's. Gantry rotation speed was 270 msecs and collimation was .9
mm. 5 mg of iv Metoprolol or nitro were given. The 3D data set was
reconstructed in 5% intervals of the R-R cycle. Systolic and
diastolic phases were analyzed on a dedicated work station using
MPR, MIP and VRT modes. The patient received 80 cc of contrast.

[Series 300: locator · axial · 0.45mm/px · z∈[-199,-199]mm · 1 of 1 slices shown, 2 images]
[im 1/1  vessel]
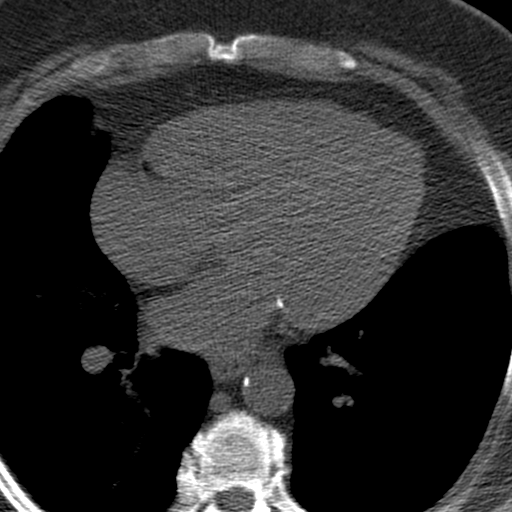
[im 1/1  lung]
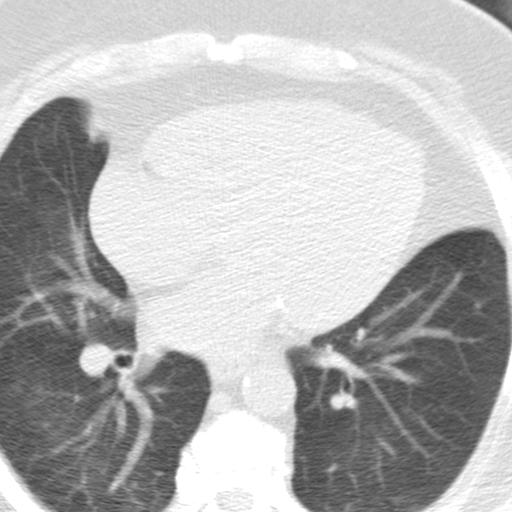

[1 of 20 positions shown; findings below may reference images not displayed]

FINDINGS: The quality of this study is affected by patient's size (BMI = 69).

Aortic Valve: Trileaflet, moderately thickened and calcified aortic
valve with moderately reduced leaflet separation.

Aorta: Normal size of aortic root and thoracic aorta. There is
almost circumferential calcification starting at the upper portion
of the sinuses of Valsalva including ostium of RCA, that continues
into the sinotubular junction and is extending into proximal
portions of the ascending thoracic aorta. The distal portion of the
ascending aorta, aortic arch and descending thoracic aorta have only
mild diffuse calcifications. There is no dissection.

Sinotubular Junction:  24 x 24 mm

Ascending Thoracic Aorta:  32 x 31 mm

Aortic Arch:  25 x 25 mm

Descending Thoracic Aorta:  24 x 23 mm

Sinus of Valsalva Measurements:

Non-coronary:  28 mm

Right -coronary:  28 mm

Left -coronary:  28 mm

Coronary Artery Height above Annulus:

Left Main:  11 mm

Right Coronary:  16 mm

Virtual Basal Annulus Measurements:

Maximum/Minimum Diameter:  25 x 23 mm

Perimeter:  88 mm

Area:  470 mm2

Coronary Arteries: The study was not intended for coronary
evaluation and NTG was not used.

Coronary arteries are originating in normal position. There is right
dominance.

RCA has moderate to severe ostial calcifications associated with >
70% stenosis, there is another calcified lesion in the proximal RCA
associated with 25-50% stenosis. Stent in distal RCA appears patent.

There are mild diffuse calcifications in the ostial left main,
proximal and mid LAD and in the mid LCX associated with 0-25%
stenosis.

Optimum Fluoroscopic Angle for Delivery:  RAO 1 PER A 0

Sheath to tip distance:  57 mm

Other findings: Moderate concentric left ventricular hypertrophy.
Mild biatrial dilatation. Pulmonary artery has normal size: 29 x 27
mm.
IMPRESSION: 1. Trileaflet, moderately thickened and calcified aortic valve with
moderately reduced leaflet separation and annular measurements
suitable for delivery of 26 mm PER A XT valve.

2. Severe circumferential calcifications of the upper portions of
sinuses of Valsalva including ostium of RCA, continuing into the
sinotubular junction and extending into proximal portions of the
ascending thoracic aorta. Normal size of the aortic root and all
portions of the thoracic aorta.

3. Optimum Fluoroscopic Angle for Delivery:  RAO 1 PER A 0.

PER A

EXAM:
OVER-READ INTERPRETATION  CT CHEST

The following report is an over-read performed by radiologist Dr.
PER A [REDACTED] on [DATE]. This
over-read does not include interpretation of cardiac or coronary
anatomy or pathology. The coronary calcium score/coronary CTA
interpretation by the cardiologist is attached.
FINDINGS: There is an enlarging anterior mediastinal lesion which measures
Hounsfield units and is most likely a thymic cyst. N unusual recess
of the pericardium is also possible. MRI may be helpful for further
evaluation an characterization given the fact that it has continued
to enlarged since [ID].

The lungs demonstrate a mosaic pattern of ground-glass attenuation.
This is most typically seen with reactive airways disease such as
asthma. Other possibilities would include respiratory bronchiolitis
and cryptogenic organizing pneumonia. Patchy areas of atelectasis
are noted. No worrisome pulmonary mass lesions. Small nodules are
noted. There is a 5 mm nodule in the right upper lobe on image
number 20, a small nodule in the right lower lobe on image number 46
and adjacent smaller clustered nodules which are more likely part of
a tree-in-bud appearance.

No pleural effusion or pulmonary edema. Lingular and right middle
lobe atelectasis is noted. No bronchiectasis.

The visualized upper abdomen is grossly normal.
IMPRESSION: 1. Enlarging anterior mediastinal lesion, likely a thymic cyst. MR
of the mediastinum without and with contrast may be helpful for
further evaluation given the fact that it continues to enlarge.
2. Small pulmonary nodules are likely inflammatory. Recommend repeat
noncontrast chest CT in 6 months.
3. Mosaic pattern of ground-glass attenuation most likely due to
reactive airways disease.

## 2014-10-12 MED ORDER — ALBUTEROL SULFATE (2.5 MG/3ML) 0.083% IN NEBU
2.5000 mg | INHALATION_SOLUTION | Freq: Once | RESPIRATORY_TRACT | Status: AC
  Administered 2014-10-12: 2.5 mg via RESPIRATORY_TRACT

## 2014-10-12 MED ORDER — IOHEXOL 350 MG/ML SOLN
80.0000 mL | Freq: Once | INTRAVENOUS | Status: AC | PRN
  Administered 2014-10-12: 80 mL via INTRAVENOUS

## 2014-10-12 MED ORDER — METOPROLOL TARTRATE 1 MG/ML IV SOLN
INTRAVENOUS | Status: AC
  Filled 2014-10-12: qty 5

## 2014-10-12 MED ORDER — METOPROLOL TARTRATE 50 MG PO TABS
ORAL_TABLET | ORAL | Status: AC
  Filled 2014-10-12: qty 1

## 2014-10-12 MED ORDER — METOPROLOL TARTRATE 1 MG/ML IV SOLN
5.0000 mg | Freq: Once | INTRAVENOUS | Status: AC
  Administered 2014-10-12: 5 mg via INTRAVENOUS
  Filled 2014-10-12: qty 5

## 2014-10-12 MED ORDER — METOPROLOL TARTRATE 25 MG PO TABS
25.0000 mg | ORAL_TABLET | Freq: Once | ORAL | Status: AC
  Administered 2014-10-12: 25 mg via ORAL
  Filled 2014-10-12: qty 1

## 2014-10-18 ENCOUNTER — Encounter: Payer: Self-pay | Admitting: Surgery

## 2014-10-18 ENCOUNTER — Ambulatory Visit (INDEPENDENT_AMBULATORY_CARE_PROVIDER_SITE_OTHER): Payer: Medicaid Other | Admitting: Surgery

## 2014-10-18 VITALS — BP 114/76 | HR 84 | Resp 18 | Ht 60.0 in | Wt 325.0 lb

## 2014-10-18 DIAGNOSIS — I2510 Atherosclerotic heart disease of native coronary artery without angina pectoris: Secondary | ICD-10-CM

## 2014-10-18 DIAGNOSIS — I350 Nonrheumatic aortic (valve) stenosis: Secondary | ICD-10-CM

## 2014-10-19 ENCOUNTER — Encounter: Payer: Self-pay | Admitting: Surgery

## 2014-10-19 NOTE — Progress Notes (Signed)
HPI:  She returns today to review the results of her recent CT scans and PFT's for TAVR work up. She has been feeling about the same with shortness of breath with minimal activity. Gated cardiac CT shows annular measurements suitable for a 26 mm Sapient XT valve. There is severe circumferential calcification of the upper portions of the sinuses of Valsalva continuing into the sinotubular junction and proximal ascending aorta. The abdominal and pelvic CT show suitable pelvic arterial access. Her PFT's show a severe obstructive defect and evidence of a moderate restrictive defect as well as a moderate reduction in diffusion capacity.  Current Outpatient Prescriptions  Medication Sig Dispense Refill  . albuterol (PROVENTIL HFA;VENTOLIN HFA) 108 (90 BASE) MCG/ACT inhaler Inhale 2 puffs into the lungs every 4 (four) hours as needed for wheezing or shortness of breath (((PLAN B))).       Marland Kitchen albuterol (PROVENTIL) (2.5 MG/3ML) 0.083% nebulizer solution Take 2.5 mg by nebulization every 4 (four) hours as needed for wheezing or shortness of breath (((PLAN C))).       Marland Kitchen aspirin EC 325 MG tablet Take 325 mg by mouth 2 (two) times daily.      Marland Kitchen atorvastatin (LIPITOR) 80 MG tablet Take 80 mg by mouth daily.      . budesonide-formoterol (SYMBICORT) 160-4.5 MCG/ACT inhaler Inhale 2 puffs into the lungs 2 (two) times daily.      . clopidogrel (PLAVIX) 75 MG tablet Take 75 mg by mouth every morning.       . escitalopram (LEXAPRO) 20 MG tablet Take 20 mg by mouth every morning.       . ezetimibe (ZETIA) 10 MG tablet Take 10 mg by mouth daily.      . furosemide (LASIX) 40 MG tablet Take 40 mg by mouth 2 (two) times daily.      Marland Kitchen gabapentin (NEURONTIN) 300 MG capsule Take 300 mg by mouth 2 (two) times daily.       Marland Kitchen glipiZIDE (GLUCOTROL) 5 MG tablet Take 5 mg by mouth daily before supper.       . losartan (COZAAR) 100 MG tablet Take 100 mg by mouth daily with breakfast.       . MAGNESIUM PO Take 1-2 tablets by  mouth daily as needed (constipation).       . Multiple Vitamin (MULTIVITAMIN) tablet Take 1 tablet by mouth daily.      Donell Sievert IN Inhale into the lungs.      . potassium chloride SA (K-DUR,KLOR-CON) 20 MEQ tablet Take 40 mEq by mouth daily.       No current facility-administered medications for this visit.     Physical Exam: BP 114/76  Pulse 84  Resp 18  Ht 5' (1.524 m)  Wt 325 lb (147.419 kg)  BMI 63.47 kg/m2  SpO2 92%  LMP 07/26/2009 She looks chronically ill Lungs: clear to auscultation, symmetrical breath sounds, no wheezes, no rhonchi  Heart: RRR, grade III/VI crescendo/decrescendo murmur heard best at RSB, no diastolic murmur Ext: warm, well-perfused, pulses not palpable, moderate LE edema    Diagnostic Tests:  Provider Default, MD Fri Oct 13, 2014 1:12:08 PM EDT       ADDENDUM REPORT: 10/13/2014 13:09  CLINICAL DATA: 53 year old female with severe aortic stenosis.  EXAM:  Cardiac TAVR CT  TECHNIQUE:  The patient was scanned on a Philips 256 scanner. A 120 kV  retrospective scan was triggered in the descending thoracic aorta at  111 HU's. Gantry rotation  speed was 270 msecs and collimation was .9  mm. 5 mg of iv Metoprolol or nitro were given. The 3D data set was  reconstructed in 5% intervals of the R-R cycle. Systolic and  diastolic phases were analyzed on a dedicated work station using  MPR, MIP and VRT modes. The patient received 80 cc of contrast.  FINDINGS:  The quality of this study is affected by patient's size (BMI = 69).  Aortic Valve: Trileaflet, moderately thickened and calcified aortic  valve with moderately reduced leaflet separation.  Aorta: Normal size of aortic root and thoracic aorta. There is  almost circumferential calcification starting at the upper portion  of the sinuses of Valsalva including ostium of RCA, that continues  into the sinotubular junction and is extending into proximal  portions of the ascending thoracic aorta. The  distal portion of the  ascending aorta, aortic arch and descending thoracic aorta have only  mild diffuse calcifications. There is no dissection.  Sinotubular Junction: 24 x 24 mm  Ascending Thoracic Aorta: 32 x 31 mm  Aortic Arch: 25 x 25 mm  Descending Thoracic Aorta: 24 x 23 mm  Sinus of Valsalva Measurements:  Non-coronary: 28 mm  Right -coronary: 28 mm  Left -coronary: 28 mm  Coronary Artery Height above Annulus:  Left Main: 11 mm  Right Coronary: 16 mm  Virtual Basal Annulus Measurements:  Maximum/Minimum Diameter: 25 x 23 mm  Perimeter: 88 mm  Area: 470 mm2  Coronary Arteries: The study was not intended for coronary  evaluation and NTG was not used.  Coronary arteries are originating in normal position. There is right  dominance.  RCA has moderate to severe ostial calcifications associated with >  70% stenosis, there is another calcified lesion in the proximal RCA  associated with 25-50% stenosis. Stent in distal RCA appears patent.  There are mild diffuse calcifications in the ostial left main,  proximal and mid LAD and in the mid LCX associated with 0-25%  stenosis.  Optimum Fluoroscopic Angle for Delivery: RAO 1 CRA 0  Sheath to tip distance: 57 mm  Other findings: Moderate concentric left ventricular hypertrophy.  Mild biatrial dilatation. Pulmonary artery has normal size: 29 x 27  mm.  IMPRESSION:  1. Trileaflet, moderately thickened and calcified aortic valve with  moderately reduced leaflet separation and annular measurements  suitable for delivery of 26 mm Edward-SAPIEN XT valve.  2. Severe circumferential calcifications of the upper portions of  sinuses of Valsalva including ostium of RCA, continuing into the  sinotubular junction and extending into proximal portions of the  ascending thoracic aorta. Normal size of the aortic root and all  portions of the thoracic aorta.  3. Optimum Fluoroscopic Angle for Delivery: RAO 1 CRA 0.  Ena Dawley    Electronically Signed  By: Ena Dawley  On: 10/13/2014 13:09       Study Result    EXAM:  OVER-READ INTERPRETATION CT CHEST  The following report is an over-read performed by radiologist Dr.  Sharion Dove [facility] on 10/12/2014. This  over-read does not include interpretation of cardiac or coronary  anatomy or pathology. The coronary calcium score/coronary CTA  interpretation by the cardiologist is attached.  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 N unusual recess  of the pericardium is also possible. MRI may be helpful for further  evaluation an characterization given the fact  that it has continued  to enlarged since 2013.  The lungs demonstrate a mosaic pattern of ground-glass attenuation.  This is most typically seen with reactive airways disease such as  asthma. Other possibilities would include respiratory bronchiolitis  and cryptogenic organizing pneumonia. Patchy areas of atelectasis  are noted. No worrisome pulmonary mass lesions. Small nodules are  noted. There is a 5 mm nodule in the right upper lobe on image  number 20, a small nodule in the right lower lobe on image number 46  and adjacent smaller clustered nodules which are more likely part of  a tree-in-bud appearance.  No pleural effusion or pulmonary edema. Lingular and right middle  lobe atelectasis is noted. No bronchiectasis.  The visualized upper abdomen is grossly normal.  IMPRESSION:  1. 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 MR  of the mediastinum without and with contrast may be helpful for  further evaluation given the fact that it continues to enlarge.  2. Small pulmonary nodules are likely inflammatory. Recommend repeat  noncontrast chest CT in 6 months.  3. Mosaic pattern of ground-glass attenuation most likely due to  reactive  airways disease.  Electronically Signed:  By: Kalman Jewels M.D.  On: 10/12/2014 15:09       CLINICAL DATA: 53 year old female with history of severe aortic  stenosis. Preprocedural study for potential transcatheter aortic  valve replacement (TAVR).  EXAM:  CTA ABDOMEN AND PELVIS WITH CONTRAST  TECHNIQUE:  Multidetector CT imaging of the abdomen and pelvis was performed  using the standard protocol during bolus administration of  intravenous contrast. Multiplanar reconstructed images and MIPs were  obtained and reviewed to evaluate the vascular anatomy.  CONTRAST: 11mL OMNIPAQUE IOHEXOL 350 MG/ML SOLN  COMPARISON: CT of the abdomen and pelvis 09/21/2013.  FINDINGS:  CT ABDOMEN AND PELVIS FINDINGS  Lower chest: Minimal scarring or subsegmental atelectasis in lung  bases bilaterally.  Hepatobiliary: No focal hepatic lesion. No intra or extrahepatic  biliary ductal dilatation. Status post cholecystectomy.  Pancreas: Unremarkable.  Spleen: Spleen is enlarged measuring 4.7 x 14.6 x 12.5 cm (estimated  splenic volume of 429 mL).  Adrenals/Urinary Tract: Normal adrenal glands bilaterally. Sub cm  low-attenuation lesions in the kidneys bilaterally are too small to  definitively characterize, but favored to represent tiny cysts. No  hydroureteronephrosis or perinephric stranding to indicate urinary  tract obstruction at this time. Urinary bladder is unremarkable in  appearance.  Stomach/Bowel: The appearance of the stomach is normal. No  pathologic dilatation of the small bowel or colon.  Vascular/Lymphatic: Atherosclerosis throughout the abdominal and  pelvic vasculature, with findings and measurements pertinent to  potential TAVR procedure, as detailed below. No pathologically  enlarged lymph nodes are noted in the abdomen or pelvis.  Reproductive: Uterus and ovaries are unremarkable in appearance.  Other: No significant volume of ascites. No pneumoperitoneum.  Postoperative  changes of mesh repair for ventral hernia. Notably,  and in the epigastric region there is a small recurrent ventral  hernia containing only omental fat. Additionally, there are 2 small  ventral hernias below the umbilicus, both of which contain only  omental fat.  Musculoskeletal: There are no aggressive appearing lytic or blastic  lesions noted in the visualized portions of the skeleton.  VASCULAR MEASUREMENTS PERTINENT TO TAVR:  AORTA:  Minimal Aortic Diameter - 10 x 14 mm mm  Severity of Aortic Calcification - moderate  RIGHT PELVIS:  Right Common Iliac Artery -  Minimal Diameter - 9.5 x 7.6 mm  Tortuosity - mild  Calcification - mild  Right External Iliac Artery -  Minimal Diameter - 7.5 x 5.9 mm  Tortuosity - mild  Calcification - none  Right Common Femoral Artery -  Minimal Diameter - 8.3 x 6.3 mm  Tortuosity - mild  Calcification - mild  LEFT PELVIS:  Left Common Iliac Artery -  Minimal Diameter - 6.4 x 8.6 mm  Tortuosity - mild  Calcification - mild  Left External Iliac Artery -  Minimal Diameter - 7.2 x 7.3 mm  Tortuosity - mild  Calcification - none  Left Common Femoral Artery -  Minimal Diameter - 8.1 x 5.6 mm  Tortuosity - mild  Calcification - mild  Review of the MIP images confirms the above findings.  IMPRESSION:  1. Findings and measurements pertinent to potential TAVR procedure,  as detailed above. This patient does appear to have suitable pelvic  arterial access.  2. Status post mesh repair for ventral hernia in the upper abdomen,  with multiple ventral hernias in the epigastric region and low  anterior abdominal wall, as detailed above, each of which contains  only omental fat.  3. Splenomegaly.  4. Status post cholecystectomy.  5. Additional incidental findings, as above.  Electronically Signed  By: Vinnie Langton M.D.  On: 10/12/2014 15:56   FVC-Pre L  1.63   FVC-%Pred-Pre %  50   FVC-Post L  1.73   FVC-%Pred-Post %  54     FVC-%Change-Post %  6   FEV1-Pre L  1.01   FEV1-%Pred-Pre %  40   FEV1-Post L  1.09   FEV1-%Pred-Post %  43   FEV1-%Change-Post %  8   FEV6-Pre L  1.60   FEV6-%Pred-Pre %  51   FEV6-Post L  1.70   FEV6-%Pred-Post %  55   FEV6-%Change-Post %  6   Pre FEV1/FVC ratio %  62   FEV1FVC-%Pred-Pre %  78   Post FEV1/FVC ratio %  63   FEV1FVC-%Change-Post %  1   Pre FEV6/FVC Ratio %  98   FEV6FVC-%Pred-Pre %  101   Post FEV6/FVC ratio %  99   FEV6FVC-%Pred-Post %  101   FEV6FVC-%Change-Post %  0   FEF 25-75 Pre L/sec  0.46   FEF2575-%Pred-Pre %  18   FEF 25-75 Post L/sec  0.61   FEF2575-%Pred-Post %  25   FEF2575-%Change-Post %  33   RV L  3.99   RV % pred %  222   TLC L  5.62   TLC % pred %  118   DLCO unc ml/min/mmHg  11.95   DLCO unc % pred %  55   DLCO cor ml/min/mmHg  11.95   DLCO cor % pred %  55   DL/VA ml/min/mmHg/L  4.36   DL/VA % pred %  96     Impression:  She has severe symptomatic aortic stenosis and single vessel coronary disease with NYHA class IV congestive heart failure symptoms and severe shortness of breath with minimal activity like taking a few steps. Her CHF is complicated by morbid obesity, COPD and OSA, and severe obstructive and restrictive lung disease. She also has a relatively small annulus on echo and some calcification of her ascending aorta on prior CTA of the chest. I think she would be an extremely high risk candidate for a conventional AVR and CABG. She would probably require a root replacement and would probably still have a significant degree of aortic stenosis due to her small  annulus size and the inherent properties of the usual prosthetic valves used for open surgery. I think TAVR would be the best option for her and her recent CT scans show that she would be a candidate for a 26 mm Sapient XT valve via a transfemoral route. 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 Angelena Form estimated at 70% and we will need  to discuss if this requires intervention and when.   Plan:  She will be seen by Dr. Roxy Manns for a 2 nd surgical opinion and we will get a PT evaluation preop. I will discuss the ostial RCA lesion with Dr. Angelena Form to see if he feels that it requires intervention before TAVR.

## 2014-10-26 ENCOUNTER — Ambulatory Visit: Payer: Medicaid Other | Attending: Surgery | Admitting: Physical Therapy

## 2014-10-26 ENCOUNTER — Institutional Professional Consult (permissible substitution) (INDEPENDENT_AMBULATORY_CARE_PROVIDER_SITE_OTHER): Payer: Medicaid Other | Admitting: Thoracic Surgery (Cardiothoracic Vascular Surgery)

## 2014-10-26 ENCOUNTER — Encounter: Payer: Self-pay | Admitting: Thoracic Surgery (Cardiothoracic Vascular Surgery)

## 2014-10-26 VITALS — BP 138/81 | HR 99 | Ht 60.0 in | Wt 350.0 lb

## 2014-10-26 DIAGNOSIS — I5030 Unspecified diastolic (congestive) heart failure: Secondary | ICD-10-CM | POA: Insufficient documentation

## 2014-10-26 DIAGNOSIS — Z6841 Body Mass Index (BMI) 40.0 and over, adult: Secondary | ICD-10-CM

## 2014-10-26 DIAGNOSIS — I350 Nonrheumatic aortic (valve) stenosis: Secondary | ICD-10-CM

## 2014-10-26 DIAGNOSIS — I5032 Chronic diastolic (congestive) heart failure: Secondary | ICD-10-CM

## 2014-10-26 NOTE — Progress Notes (Signed)
[facility]  MULTIDISCIPLINARY HEART VALVE CLINIC    CARDIOTHORACIC SURGERY CONSULTATION REPORT  Referring Provider is Lauree Chandler D*MD PCP is Maggie Font, MD  Chief Complaint  Patient presents with  . NEW CARDIAC    2nd TAVR CONSULT    HPI:  Patient is a 53 year old morbidly obese white female from [place] with multiple chronic medical problems including aortic stenosis, coronary artery disease, COPD with asthma, obstructive sleep apnea, hypertension, hyperlipidemia, and degenerative arthritis who has been referred for a second surgical opinion to discuss treatment options for the management of severe symptomatically aortic stenosis. The patient states that she has been told that she had a heart murmur beginning in childhood. She has no documented history of rheumatic fever, but she states that she had a severe illness during childhood that lasted for more than a week and left her bedridden for extended period of time. She has struggled with morbid obesity for all of her adult life and over time she has developed progressive dyspnea with exertion. In 2010 she suffered an acute myocardial infarction for which she was treated with PCI and stenting of the distal right coronary artery using a drug eluding stent.  In February 2013 she underwent right total knee replacement, after which she developed acute exacerbation of shortness of breath. She was diagnosed with deep venous thrombosis and pulmonary embolus and she was treated with Coumadin for 6 months. In February 2014 she presented with worsening shortness of breath. CTA of the chest was notable for the absence of recurrent or persistent pulmonary embolus. Transthoracic echocardiogram performed at that time demonstrated the presence of aortic stenosis with peak velocity across the aortic valve reported 3.7 m/sec with mean transvalvular gradient estimated 40 mmHg.  The peak velocity LVOT/aortic valve ratio was 0.29.   The patient underwent left and right heart catheterization 03/17/2013. The previously placed stent in the right coronary artery was patent and there was no other significant flow limiting disease.  Peak to peak and mean transvalvular gradients across the aortic valve were measured 31 and 22 mmHg, respectively. Aortic valve area was calculated 1.15 cm. There was moderate pulmonary hypertension.  The patient subsequently underwent transesophageal echocardiogram. By planimetry the aortic valve area was measured between 1.2 and 1.4 cm. The mean transvalvular gradient was 29 mmHg. Medical therapy was recommended. Recently the patient returned for follow-up to see Dr. Angelena Form complaining of worsening symptoms of shortness of breath. Follow-up transthoracic echocardiogram suggested significant progression of the severity of aortic stenosis with peak velocity across the aortic valve was reported 4.3 m/s and mean transvalvular gradient estimated 50 mmHg. The peak velocity LVOT/aortic valve ratio was 0.34.  Left ventricular ejection fraction remained normal at 60%. The patient subsequently underwent repeat left and right heart catheterization. This revealed the development of 70-80% ostial stenosis of the right coronary artery but continued patency of the stent in the distal right coronary artery.  There was no other significant flow limiting coronary artery disease. Peak to peak and mean transvalvular gradients across aortic valve were reported 46 and 12 mmHg, respectively.  There remained moderate pulmonary hypertension. The patient was referred for surgical consultation and evaluated by Dr. Cyndia Bent. He felt that the patient was a poor candidate for conventional surgery and discussed transcatheter aortic valve replacement as an alternative.  Since then the patient has undergone extensive diagnostic workup to consider transcatheter aortic valve replacement as an alternative. The patient has been referred for a second  surgical opinion to discuss treatment  options.  The patient is married and lives with her husband, son, and daughter in law locally in [place]. She lives a very sedentary lifestyle. She has been chronically limited by morbid obesity and all associated complications including chronic dyspnea with exertion, obstructive sleep apnea, GE reflux disease, and severe degenerative arthritis. She describes a long history of shortness of breath which has gradually progressed over many years. She now gets short of breath with minimal activity and occasionally at rest. She cannot lay flat in bed. She occasionally wakes up in the middle night feeling short of breath. She has occasional tightness across her chest in association with episodes of severe shortness of breath. She otherwise does not have much chest pain or chest tightness. She has occasional dizzy spells without syncope. She has moderate chronic bilateral lower extremity edema.  She has a long history of chronic lung disease with history of heavy tobacco abuse in the past, although she quit smoking in 2005. She has severe COPD with asthma, obstructive sleep apnea, and nocturnal hypoxemia. She has been using home oxygen at night for many years and recently has been using it more frequently during the day.  Her physical mobility is limited primarily by dyspnea. She has chronic arthritis involving both knees and her back but she is able to ambulate without the assistance of a cane or walker.  She has never been formally evaluated to consider weight loss surgery but she is interested in considering it in the future.    Past Medical History  Diagnosis Date  . Asthma   . Hypertension   . Hyperlipidemia   . GERD (gastroesophageal reflux disease)   . Depression   . Blood transfusion     [facility]  . Headache(784.0)     h/o migraines  . Pulmonary embolism 02/08/12    S/P RT TOTAL KNEE ON 02/03/12--ON 02/08/12--DEVELOPED ACUTE SOB AND CHEST PAIN--AND DIAGNOSED  WITH  PULMONARY EMBOLUS AND PNEUMONIA  . Restless leg syndrome   . Myocardial infarction     PT THINKS SHE WAS DX WITH MI AT THE TIME OF HEART STENTING  . CAD (coronary artery disease)     Cath 2010 with DES x 1 RCA-- PT'S CARDIOLOGIST IS DR. Niota  . Uterine fibroid     NO PROBLEMS AT PRESENT FROM THE FIBROIDS-STATES SHE IS POST MENOPAUSAL-LAST MENSES 2010 EXCEPT FOR EPISODE THIS YR OF BLEEDING RELATED TO FIBROIDS.  Marland Kitchen Heart murmur   . Pneumonia 02/08/12    HOSPITALIZED AT [facility] WITH PNEUMONIA AND WITH PULMONARY EMBOLUS  . Diabetes mellitus DIAGNOSED IN2010    ORAL MEDS  . Arthritis     PAIN AND SEVERE OA LEFT KNEE ; S/P RIGHT TKA ON 02/03/12; HAS LOWER BACK PAIN-UNABLE TO STAND MORE THAN 10 MIN; ARTHRITIS "ALL OVER"  . Neck pain   . Weakness     BOTH HANDS - S/P BILATERAL CARPAL TUNNEL RELEASE--BUT STILL HAS WEAKNESS--OFTEN DROPS THINGS  . Shortness of breath     with exertion  . Eczema     on back  . Kidney stones   . Anemia   . COPD (chronic obstructive pulmonary disease)   . Sleep apnea     uses 3 liters O2 at night   . Aortic valve stenosis, severe   . Aortic stenosis   . Morbid obesity with body mass index of 60.0-69.9 in adult   . Chronic diastolic congestive heart failure     Past Surgical History  Procedure Laterality Date  .  Tubal ligation    . Carpal tunnel release      Bilateral  . Cholecystectomy    . Hernia repair    . Cardiac catheterization    . Knee arthroplasty  02/03/2012    Procedure: COMPUTER ASSISTED TOTAL KNEE ARTHROPLASTY;  Surgeon: Mcarthur Rossetti, MD;  Location: [place] [facility];  Service: Orthopedics;  Laterality: Right;  Right total knee arthroplasty  . Coronary angioplasty      2010 has stent in place  . Total knee arthroplasty  09/10/2012    Procedure: TOTAL KNEE ARTHROPLASTY;  Surgeon: Mcarthur Rossetti, MD;  Location: [facility];  Service: Orthopedics;  Laterality: Left;  . Trigger finger release  09/10/2012    Procedure: RELEASE TRIGGER  FINGER/A-1 PULLEY;  Surgeon: Mcarthur Rossetti, MD;  Location: [facility];  Service: Orthopedics;  Laterality: Right;  Right Ring Finger  . Tee without cardioversion N/A 03/14/2013    Procedure: TRANSESOPHAGEAL ECHOCARDIOGRAM (TEE);  Surgeon: Lelon Perla, MD;  Location: [facility];  Service: Cardiovascular;  Laterality: N/A;  . Joint replacement      bil total knees  . Total knee revision Right 07/15/2013    Procedure: REVISION ARTHROPLASTY RIGHT KNEE;  Surgeon: Mcarthur Rossetti, MD;  Location: [facility];  Service: Orthopedics;  Laterality: Right;  . Cystoscopy w/ retrogrades Right 09/21/2013    Procedure: CYSTOSCOPY WITH RIGHT RETROGRADE PYELOGRAM RIGHT DOUBLE J STENT ;  Surgeon: Fredricka Bonine, MD;  Location: [facility];  Service: Urology;  Laterality: Right;  . Cystoscopy with ureteroscopy and stent placement Right 10/25/2013    Procedure: CYSTOSCOPY RIGHT URETEROSCOPY HOLMIUM LASER LITHO AND STENT PLACEMENT;  Surgeon: Fredricka Bonine, MD;  Location: [facility];  Service: Urology;  Laterality: Right;    Family History  Problem Relation Age of Onset  . Breast cancer Mother   . COPD Father     smoked  . Cancer Brother     Sinus  . Emphysema Mother     smoked  . Asthma Father   . Heart disease Mother   . Heart disease Father     History   Social History  . Marital Status: Married    Spouse Name: N/A    Number of Children: 2  . Years of Education: N/A   Occupational History  . Disabled    Social History Main Topics  . Smoking status: Former Smoker -- 1.50 packs/day for 30 years    Types: Cigarettes    Quit date: 12/29/2000  . Smokeless tobacco: Never Used  . Alcohol Use: No  . Drug Use: No  . Sexual Activity: Yes    Birth Control/ Protection: Surgical   Other Topics Concern  . Not on file   Social History Narrative  . No narrative on file    Current Outpatient Prescriptions  Medication Sig Dispense Refill  . albuterol (PROVENTIL HFA;VENTOLIN  HFA) 108 (90 BASE) MCG/ACT inhaler Inhale 2 puffs into the lungs every 4 (four) hours as needed for wheezing or shortness of breath (((PLAN B))).       Marland Kitchen albuterol (PROVENTIL) (2.5 MG/3ML) 0.083% nebulizer solution Take 2.5 mg by nebulization every 4 (four) hours as needed for wheezing or shortness of breath (((PLAN C))).       Marland Kitchen aspirin EC 325 MG tablet Take 325 mg by mouth 2 (two) times daily.      Marland Kitchen atorvastatin (LIPITOR) 80 MG tablet Take 80 mg by mouth daily.      . budesonide-formoterol (SYMBICORT) 160-4.5 MCG/ACT  inhaler Inhale 2 puffs into the lungs 2 (two) times daily.      . clopidogrel (PLAVIX) 75 MG tablet Take 75 mg by mouth every morning.       . escitalopram (LEXAPRO) 20 MG tablet Take 20 mg by mouth every morning.       . ezetimibe (ZETIA) 10 MG tablet Take 10 mg by mouth daily.      . furosemide (LASIX) 40 MG tablet Take 40 mg by mouth 2 (two) times daily.      Marland Kitchen gabapentin (NEURONTIN) 300 MG capsule Take 300 mg by mouth 2 (two) times daily.       Marland Kitchen glipiZIDE (GLUCOTROL) 5 MG tablet Take 5 mg by mouth daily before supper.       . losartan (COZAAR) 100 MG tablet Take 100 mg by mouth daily with breakfast.       . MAGNESIUM PO Take 1-2 tablets by mouth daily as needed (constipation).       . Multiple Vitamin (MULTIVITAMIN) tablet Take 1 tablet by mouth daily.      Donell Sievert IN Inhale into the lungs.      . potassium chloride SA (K-DUR,KLOR-CON) 20 MEQ tablet Take 40 mEq by mouth daily.       No current facility-administered medications for this visit.    No Known Allergies    Review of Systems:   General:  Apparently good appetite, decreased energy, + weight gain, no weight loss, no fever  Cardiac:  + chest pain with exertion, no chest pain at rest, + SOB with minimal exertion, + occasional resting SOB, + PND, + orthopnea, no palpitations, no arrhythmia, no atrial fibrillation, + LE edema, + dizzy spells, no syncope  Respiratory:  + chronic shortness of breath, + home  oxygen, no productive cough, + dry cough, no bronchitis, + wheezing, no hemoptysis, + asthma, no pain with inspiration or cough, + sleep apnea, no CPAP at night  GI:   no difficulty swallowing, + reflux, no frequent heartburn, no hiatal hernia, no abdominal pain, + constipation, no diarrhea, no hematochezia, no hematemesis, no melena  GU:   no dysuria,  no frequency, + occasional urinary tract infection, no hematuria, no kidney stones, no kidney disease  Vascular:  no pain suggestive of claudication, no pain in feet, + leg cramps, no varicose veins, + DVT, no non-healing foot ulcer  Neuro:   no stroke, no TIA's, no seizures, + headaches, no temporary blindness one eye,  no slurred speech, no peripheral neuropathy, + mild chronic pain, mild instability of gait, no memory/cognitive dysfunction  Musculoskeletal: + arthritis, no joint swelling, no myalgias, mild difficulty walking, decreased mobility   Skin:   + rash under pannus during the summer months, no itching, no skin infections, no pressure sores or ulcerations  Psych:   no anxiety, + depression, no nervousness, no unusual recent stress  Eyes:   + blurry vision, + floaters, + recent vision changes, + wears glasses or contacts  ENT:   no hearing loss, no loose or painful teeth, + partial dentures, last saw dentist March 2015  Hematologic:  + easy bruising, no abnormal bleeding, no clotting disorder, no frequent epistaxis  Endocrine:  + diabetes, occasionally checks CBG's at home           Physical Exam:   BP 138/81  Pulse 99  Ht 5' (1.524 m)  Wt 350 lb (158.759 kg)  BMI 68.35 kg/m2  SpO2 97%  LMP 07/26/2009  General:  Morbidly obese and chronically ill-appearing  HEENT:  Unremarkable   Neck:   no JVD, no bruits, no adenopathy   Chest:   clear to auscultation, symmetrical breath sounds, no wheezes, no rhonchi   CV:   RRR, grade II/VI crescendo/decrescendo murmur heard best at RUSB,  no diastolic murmur  Abdomen:  soft, non-tender,  no masses   Extremities:  warm, well-perfused, pulses not palpable, mild LE edema  Rectal/GU  Deferred  Neuro:   Grossly non-focal and symmetrical throughout  Skin:   Clean and dry, no rashes, no breakdown   Diagnostic Tests:  Transthoracic Echocardiography  Patient: Jagger, Beahm MR #: [id number] Study Date: 02/01/2013 Gender: F Age: 5 Height: 160cm Weight: 151.5kg BSA: 2.34m^2 Pt. Status: Room:  ATTENDING [place] Thereasa Solo, Harvette C SONOGRAPHER Cindy Hazy, RDCS PERFORMING Zacarias Pontes, [facility] ORDERING [place], Christopher REFERRING [place], Christopher cc:  ------------------------------------------------------------ LV EF: 60% - 65%  ------------------------------------------------------------ Indications: 424.1 Aortic valve disorders.  ------------------------------------------------------------ History: PMH: Acquired from the patient and from the patient's chart. PMH: Chronic Kidney Disease. Hepatitis. Obstructive sleep apnea. Pulmonary embolism. Pneumonia. COPD. Risk factors: Former tobacco use. Hypertension. Diabetes mellitus. Morbidly obese. Dyslipidemia.  ------------------------------------------------------------ Study Conclusions  - Left ventricle: The cavity size was normal. Wall thickness was increased in a pattern of mild LVH. Systolic function was normal. The estimated ejection fraction was in the range of 60% to 65%. Wall motion was normal; there were no regional wall motion abnormalities. Doppler parameters are consistent with abnormal left ventricular relaxation (grade 1 diastolic dysfunction). - Aortic valve: There was severe stenosis. Trivial regurgitation. - Mitral valve: Calcified annulus.  ------------------------------------------------------------ Labs, prior tests, procedures, and surgery: Cath-2010. Right Knee Replacement-Feb 06, 2012. Transthoracic echocardiography. M-mode, complete 2D, spectral  Doppler, and color Doppler. Height: Height: 160cm. Height: 63in. Weight: Weight: 151.5kg. Weight: 333.3lb. Body mass index: BMI: 59.2kg/m^2. Body surface area: BSA: 2.23m^2. Blood pressure: 134/76. Patient status: Outpatient. Location:  Site [facility]  ------------------------------------------------------------  ------------------------------------------------------------ Left ventricle: The cavity size was normal. Wall thickness was increased in a pattern of mild LVH. Systolic function was normal. The estimated ejection fraction was in the range of 60% to 65%. Wall motion was normal; there were no regional wall motion abnormalities. Doppler parameters are consistent with abnormal left ventricular relaxation (grade 1 diastolic dysfunction).  ------------------------------------------------------------ Aortic valve: Poorly visualized. Mildly calcified leaflets. Doppler: There was severe stenosis. Trivial regurgitation. VTI ratio of LVOT to aortic valve: 0.3. Valve area: 0.75cm^2(VTI). Indexed valve area: 0.31cm^2/m^2 (VTI). Peak velocity ratio of LVOT to aortic valve: 0.29. Valve area: 0.74cm^2 (Vmax). Indexed valve area: 0.31cm^2/m^2 (Vmax). Mean gradient: 42mm Hg (S). Peak gradient: 70mm Hg (S).  ------------------------------------------------------------ Aorta: Aortic root: The aortic root was normal in size.  ------------------------------------------------------------ Mitral valve: Calcified annulus. Mobility was not restricted. Doppler: Transvalvular velocity was within the normal range. There was no evidence for stenosis. No regurgitation. Peak gradient: 87mm Hg (D).  ------------------------------------------------------------ Left atrium: The atrium was normal in size.  ------------------------------------------------------------ Right ventricle: The cavity size was normal. Systolic function was  normal.  ------------------------------------------------------------ Pulmonic valve: Doppler: Transvalvular velocity was within the normal range. There was no evidence for stenosis.  ------------------------------------------------------------ Tricuspid valve: Structurally normal valve. Doppler: Transvalvular velocity was within the normal range. No regurgitation.  ------------------------------------------------------------ Right atrium: The atrium was normal in size.  ------------------------------------------------------------ Pericardium: There was no pericardial effusion.  ------------------------------------------------------------ Systemic veins: Inferior vena cava: The vessel was normal in size.  ------------------------------------------------------------  2D measurements Normal Doppler measurements Normal Left ventricle  Left ventricle LVID ED, 44.6 mm 43-52 Ea, lat 8.22 cm/s ------ chord, ann, tiss PLAX DP LVID ES, 29.4 mm 23-38 E/Ea, lat 11.2 ------ chord, ann, tiss PLAX DP FS, chord, 34 % >29 Ea, med 6.58 cm/s ------ PLAX ann, tiss LVPW, ED 13.6 mm ------ DP IVS/LVPW 1.02 <1.3 E/Ea, med 14 ------ ratio, ED ann, tiss Ventricular septum DP IVS, ED 13.9 mm ------ LVOT LVOT Peak vel, 109 cm/s ------ Diam, S 18 mm ------ S Area 2.54 cm^2 ------ VTI, S 22.2 cm ------ Diam 18 mm ------ Stroke vol 56.5 ml ------ Aorta Stroke 23.5 ml/m^2 ------ Root diam, 25 mm ------ index ED Aortic valve Left atrium Peak vel, 376 cm/s ------ AP dim 36 mm ------ S AP dim 1.5 cm/m^2 <2.2 Mean vel, 305 cm/s ------ index S VTI, S 75.1 cm ------ Mean 40 mm Hg ------ gradient, S Peak 57 mm Hg ------ gradient, S VTI ratio 0.3 ------ LVOT/AV Area, VTI 0.75 cm^2 ------ Area index 0.31 cm^2/m ------ (VTI) ^2 Peak vel 0.29 ------ ratio, LVOT/AV Area, Vmax 0.74 cm^2 ------ Area index 0.31 cm^2/m ------ (Vmax) ^2 Mitral valve Peak E vel 92.1 cm/s ------ Peak A vel 107  cm/s ------ Decelerati 232 ms 150-23 on time 0 Peak 3 mm Hg ------ gradient, D Peak E/A 0.9 ------ ratio Right ventricle Sa vel, 16.4 cm/s ------ lat ann, tiss DP  ------------------------------------------------------------ Prepared and Electronically Authenticated by  Kirk Ruths 2014-02-04T10:[id number]    Transesophageal Echocardiography  Patient: Sereena, Marando MR #: [id number] Study Date: 03/14/2013 Gender: F Age: 23 Height: 160cm Weight: 148.6kg BSA: 2.32m^2 Pt. Status: Room: [facility]  ADMITTING Kirk Ruths ATTENDING [place]renshaw, Aaron Edelman PERFORMING Kirk Ruths SONOGRAPHER Wyatt Mage, RDCS ORDERING [place], Christopher Parks Ranger, Christopher cc:  ------------------------------------------------------------ LV EF: 60% - 65%  ------------------------------------------------------------ Indications: Aortic stenosis 424.1.  ------------------------------------------------------------ Study Conclusions  - Left ventricle: Systolic function was normal. The estimated ejection fraction was in the range of 60% to 65%. Wall motion was normal; there were no regional wall motion abnormalities. - Aortic valve: There was moderate stenosis. Mild regurgitation. - Mitral valve: No evidence of vegetation. - Left atrium: The atrium was mildly dilated. No evidence of thrombus in the atrial cavity or appendage. - Atrial septum: Doppler showed no right-to-left atrial level shunt. - Tricuspid valve: No evidence of vegetation. Transesophageal echocardiography. 2D and color Doppler. Height: Height: 160cm. Height: 63in. Weight: Weight: 148.6kg. Weight: 327lb. Body mass index: BMI: 58kg/m^2. Body surface area: BSA: 2.45m^2. Blood pressure: 148/95. Patient status: Outpatient. Location: Endoscopy.  ------------------------------------------------------------  ------------------------------------------------------------ Left ventricle: Systolic function  was normal. The estimated ejection fraction was in the range of 60% to 65%. Wall motion was normal; there were no regional wall motion abnormalities.  ------------------------------------------------------------ Aortic valve: Mean gradient 29 mm Hg. Aortic valve area by planimetry 1.2 - 1.4 cm. Trileaflet. Doppler: There was moderate stenosis. Mild regurgitation. Mean gradient: 52mm Hg (S). Peak gradient: 79mm Hg (S).  ------------------------------------------------------------ Aorta: There was moderate atheromatous plaque in the descending aorta.  ------------------------------------------------------------ Mitral valve: Structurally normal valve. Leaflet separation was normal. No evidence of vegetation. Doppler: Trivial regurgitation.  ------------------------------------------------------------ Left atrium: The atrium was mildly dilated. No evidence of thrombus in the atrial cavity or appendage.  ------------------------------------------------------------ Atrial septum: Doppler showed no right-to-left atrial level shunt.  ------------------------------------------------------------ Right ventricle: The cavity size was normal. Systolic function was normal.  ------------------------------------------------------------ Pulmonic valve: Structurally normal valve. Cusp separation was normal.  ------------------------------------------------------------ Tricuspid valve: Structurally normal valve. Leaflet separation was normal. No evidence  of vegetation. Doppler: Mild regurgitation.  ------------------------------------------------------------ Right atrium: The atrium was normal in size.  ------------------------------------------------------------ Pericardium: There was no pericardial effusion.  ------------------------------------------------------------  Doppler measurements Normal Aortic valve Peak vel, S 320 cm/s ------ Mean vel, S 235 cm/s ------ VTI, S 79.5 cm  ------ Mean 29 mm ------ gradient, S Hg Peak 41 mm ------ gradient, S Hg  ------------------------------------------------------------ Prepared and Electronically Authenticated by  Kirk Ruths 2014-03-17T14:[id number]    Transthoracic Echocardiography  Patient: Curtina, Grills MR #: [id number] Study Date: 08/10/2014 Gender: F Age: 78 Height: 160 cm Weight: 159.7 kg BSA: 2.78 m^2 Pt. Status: Room:  ATTENDING Darlina Guys, MD SONOGRAPHER Cindy Hazy, RDCS ORDERING [place], [place], Outpatient  cc:  ------------------------------------------------------------------- LV EF: 60% - 65%  ------------------------------------------------------------------- Indications: 424.1 Aortic valve disorders.  ------------------------------------------------------------------- History: PMH: Acquired from the patient and from the patient&'s chart. PMH: History of Pulmonary Embolism. CAD. COPD. Obstructive Sleep Apnea. Shortness of breath. Pneumonia. Murmur. Risk factors: Former tobacco use. Hypertension. Diabetes mellitus. Morbidly obese. Dyslipidemia.  ------------------------------------------------------------------- Study Conclusions  - Left ventricle: The cavity size was normal. Wall thickness was normal. Systolic function was normal. The estimated ejection fraction was in the range of 60% to 65%. Doppler parameters are consistent with abnormal left ventricular relaxation (grade 1 diastolic dysfunction). - Aortic valve: AV is difficult to see well From right upper parasternal window peak and mean gradients through the valve are 77 and 50 mm Hg respectively consistent with severe/critical AS. This is increased from echo of 2014.  Transthoracic echocardiography. M-mode, complete 2D, spectral Doppler, and color Doppler. Birthdate: Patient birthdate: 09/06/1961. Age: Patient is 53 yr old. Sex: Gender:  female. BMI: 62.4 kg/m^2. Blood pressure: 120/70 Patient status: Outpatient. Study date: Study date: 08/10/2014. Study time: 01:23 PM. Location: [facility]  -------------------------------------------------------------------  ------------------------------------------------------------------- Left ventricle: The cavity size was normal. Wall thickness was normal. Systolic function was normal. The estimated ejection fraction was in the range of 60% to 65%. Doppler parameters are consistent with abnormal left ventricular relaxation (grade 1 diastolic dysfunction).  ------------------------------------------------------------------- Aortic valve: AV is difficult to see well From right upper parasternal window peak and mean gradients through the valve are 77 and 50 mm Hg respectively consistent with severe/critical AS. This is increased from echo of 2014. Mildly thickened, mildly calcified leaflets. Doppler: There was no significant regurgitation. VTI ratio of LVOT to aortic valve: 0.34. Valve area (VTI): 1.07 cm^2. Indexed valve area (VTI): 0.39 cm^2/m^2. Valve area (Vmax): 1.02 cm^2. Indexed valve area (Vmax): 0.37 cm^2/m^2. Mean gradient (S): 52 mm Hg. Peak gradient (S): 77 mm Hg.  ------------------------------------------------------------------- Mitral valve: Calcified annulus. Mildly thickened leaflets . Doppler: There was trivial regurgitation. Peak gradient (D): 5 mm Hg.  ------------------------------------------------------------------- Right ventricle: The cavity size was normal. Wall thickness was normal. Systolic function was normal.  ------------------------------------------------------------------- Right atrium: The atrium was normal in size.  ------------------------------------------------------------------- Pericardium: There was no pericardial effusion.  ------------------------------------------------------------------- Systemic veins: Inferior vena  cava: The vessel was mildly dilated. The respirophasic diameter changes were in the normal range (= 50%), consistent with normal central venous pressure.  ------------------------------------------------------------------- Measurements  Left ventricle Value 03/14/2013 Reference LV ID, ED, PLAX 52 mm ---------- 43 - 52 chordal LV ID, ES, PLAX 29 mm ---------- 23 - 38 chordal LV fx shortening, 44 % ---------- >=29 PLAX chordal LV PW thickness, ED 10 mm ---------- --------- IVS/LV PW ratio, ED 1 ---------- <=1.3 Stroke volume, 2D 91 ml ---------- --------- Stroke volume/bsa, 33  ml/m^2 ---------- --------- 2D LV e&', lateral 8.7 cm/s ---------- --------- LV E/e&', lateral 12.3 ---------- --------- LV e&', medial 7.4 cm/s ---------- --------- LV E/e&', medial 14.46 ---------- --------- LV e&', average 8.05 cm/s ---------- --------- LV E/e&', average 13.29 ---------- ---------  Ventricular septum Value 03/14/2013 Reference IVS thickness, ED 10 mm ---------- ---------  LVOT Value 03/14/2013 Reference LVOT ID, S 20 mm ---------- --------- LVOT area 3.14 cm^2 ---------- --------- LVOT VTI, S 29.1 cm ---------- ---------  Aortic valve Value 03/14/2013 Reference Aortic valve peak 438 cm/s 320 --------- velocity, S Aortic valve mean 329 cm/s 235 --------- velocity, S Aortic valve VTI, S 85.2 cm 79.5 --------- Aortic mean 52 mm Hg 29 --------- gradient, S Aortic peak 77 mm Hg 41 --------- gradient, S VTI ratio, LVOT/AV 0.34 ---------- --------- Aortic valve area, 1.07 cm^2 ---------- --------- VTI Aortic valve 0.39 cm^2/m^2 ---------- --------- area/bsa, VTI Aortic valve area, 1.02 cm^2 ---------- --------- peak velocity Aortic valve 0.37 cm^2/m^2 ---------- --------- area/bsa, peak velocity  Aorta Value 03/14/2013 Reference Aortic root ID, ED 25 mm ---------- ---------  Left atrium Value 03/14/2013 Reference LA ID, A-P, ES 40 mm ---------- --------- LA ID/bsa, A-P 1.44  cm/m^2 ---------- <=2.2  Mitral valve Value 03/14/2013 Reference Mitral E-wave peak 107 cm/s ---------- --------- velocity Mitral A-wave peak 130 cm/s ---------- --------- velocity Mitral deceleration 218 ms ---------- 150 - 230 time Mitral peak 5 mm Hg ---------- --------- gradient, D Mitral E/A ratio, 0.8 ---------- --------- peak  Right ventricle Value 03/14/2013 Reference RV s&', lateral, S 22.7 cm/s ---------- ---------  Legend: (L) and (H) mark values outside specified reference range.  ------------------------------------------------------------------- Prepared and Electronically Authenticated by  Dorris Carnes, M.D. 2015-08-13T16:57:06    Cardiac Catheterization Operative Report  Hollie Salk  [id number]  9/17/20159:55 AM  Theressa Millard, MD  Procedure Performed:  1. Left Heart Catheterization 2. Selective Coronary Angiography 3. Right Heart Catheterization Operator: Lauree Chandler, MD  Indication: 53 yo WF with history of CAD, OSA, HTN, HLD, obesity, GERD, asthma, COPD, former tobacco abuse, depression here today for cardiac catheterization. I saw her for the first time in December 2012. She had a cardiac cath in February 2010 and had a drug eluting stent (2.63mm Xience) placed in the distal RCA per Dr. Melvern Banker. Her LAD and Circumflex were free of disease. She had her right knee replaced in February 8,2013. She developed SOB on 02/08/12 and was found to have a PE and pneumonia. She was started on coumadin and treated for 6 months. CTA chest on 08/09/12 showed no evidence of PE so her coumadin was stopped. I saw her February 20,2014 and she c/o severe dyspnea with minimal exertion. I felt that this was multi-factorial. Chest CTA with no PE. I arranged a stress myoview which showed possible ischemia. Echo with severe AS, normal LV function. Cardiac cath on 03/17/13 with mild non-obstructive disease and patent stent RCA. TEE with moderate AS. Most recent echo  08/10/14 with normal LV function, severe AS with mean gradient of 50 mmHg, peak gradient 77 mmHg. She has had worsening of dyspnea with exertion. She cannot walk across the room without severe dyspnea. Right and left heart cath to assess pressures, exclude progression of CAD.  Procedure Details:  The risks, benefits, complications, treatment options, and expected outcomes were discussed with the patient. The patient and/or family concurred with the proposed plan, giving informed consent. The patient was brought to the cath lab after IV hydration was begun and oral premedication was given. The patient  was further sedated with Versed and Fentanyl. Due to her morbid obesity, our initial plan was to perform right heart cath from the right antecubital vein and her left heart cath from the right radial artery. There was a IV line present in the right antecubital vein. This area was prepped and draped. I could not advance a wire through the IV catheter. I then prepped the right groin and placed a 7 French sheath in the right femoral vein. Right heart cath was performed with a balloon tipped catheter. I then prepped the right wrist. 1% lidocaine used for local anesthesia. A 5 French sheath was placed in the right radial artery. 3 mg Verapamil given through the sheath. 6000 Units IV heparin through sheath. Standard diagnostic catheters were used to perform selective coronary angiography. I was never able to fully engage the RCA due to ostial stenosis and instability of the catheter due to the high velocity jet in the aorta from her AS. I was able to cross the aortic valve with a AL-1 catheter and long straight wire. Pressures were measured. There were no immediate complications. The patient was taken to the recovery area in stable condition.  Hemodynamic Findings:  Ao: 136/80  LV: 182/15/28  RA: 6  RV: 50/10/16  PA: 42/28 (mean 35)  PCWP: 23  Fick Cardiac Output: 4.99 L/min  Fick Cardiac Index: 2.1 L/min/m2   Central Aortic Saturation: 97%  Pulmonary Artery Saturation: 59%  Aortic Valve Data: Peak to peak gradient 46 mmHg  Mean gradient 12 mmHg  Angiographic Findings:  Left main: No obstructive disease.  Left Anterior Descending Artery: Large caliber vessel that courses to the apex. There is a moderate caliber diagonal branch. No obstructive disease noted.  Circumflex Artery: Large caliber vessel with two small obtuse marginal branches and a moderate caliber posterolateral branch. No obstructive disease noted.  Right Coronary Artery: Large dominant vessel with 70-80% ostial stenosis. 40% proximal stenosis. Distal stented segment is patent with no restenosis.  Left Ventricular Angiogram: Deferred.  Impression:  1. Single vessel CAD with ostial RCA stenosis, patent distal RCA stent  2. Severe aortic valve stenosis  3. Morbid obesity  Recommendations: She has progression of her CAD with ostial RCA stenosis and severe AS. She will need AVR and bypass of RCA. I will refer her to see one of our CT surgeons. She will be high risk for traditional AVR due to size. May have to consider TAVR/PCI of RCA.  Complications: None; patient tolerated the procedure well.    Cardiac TAVR CT  TECHNIQUE:  The patient was scanned on a Philips 256 scanner. A 120 kV  retrospective scan was triggered in the descending thoracic aorta at  111 HU's. Gantry rotation speed was 270 msecs and collimation was .9  mm. 5 mg of iv Metoprolol or nitro were given. The 3D data set was  reconstructed in 5% intervals of the R-R cycle. Systolic and  diastolic phases were analyzed on a dedicated work station using  MPR, MIP and VRT modes. The patient received 80 cc of contrast.  FINDINGS:  The quality of this study is affected by patient's size (BMI = 69).  Aortic Valve: Trileaflet, moderately thickened and calcified aortic  valve with moderately reduced leaflet separation.  Aorta: Normal size of aortic root and thoracic aorta.  There is  almost circumferential calcification starting at the upper portion  of the sinuses of Valsalva including ostium of RCA, that continues  into the sinotubular junction and is extending into  proximal  portions of the ascending thoracic aorta. The distal portion of the  ascending aorta, aortic arch and descending thoracic aorta have only  mild diffuse calcifications. There is no dissection.  Sinotubular Junction: 24 x 24 mm  Ascending Thoracic Aorta: 32 x 31 mm  Aortic Arch: 25 x 25 mm  Descending Thoracic Aorta: 24 x 23 mm  Sinus of Valsalva Measurements:  Non-coronary: 28 mm  Right -coronary: 28 mm  Left -coronary: 28 mm  Coronary Artery Height above Annulus:  Left Main: 11 mm  Right Coronary: 16 mm  Virtual Basal Annulus Measurements:  Maximum/Minimum Diameter: 25 x 23 mm  Perimeter: 88 mm  Area: 470 mm2  Coronary Arteries: The study was not intended for coronary  evaluation and NTG was not used.  Coronary arteries are originating in normal position. There is right  dominance.  RCA has moderate to severe ostial calcifications associated with >  70% stenosis, there is another calcified lesion in the proximal RCA  associated with 25-50% stenosis. Stent in distal RCA appears patent.  There are mild diffuse calcifications in the ostial left main,  proximal and mid LAD and in the mid LCX associated with 0-25%  stenosis.  Optimum Fluoroscopic Angle for Delivery: RAO 1 CRA 0  Sheath to tip distance: 57 mm  Other findings: Moderate concentric left ventricular hypertrophy.  Mild biatrial dilatation. Pulmonary artery has normal size: 29 x 27  mm.  IMPRESSION:  1. Trileaflet, moderately thickened and calcified aortic valve with  moderately reduced leaflet separation and annular measurements  suitable for delivery of 26 mm Edward-SAPIEN XT valve.  2. Severe circumferential calcifications of the upper portions of  sinuses of Valsalva including ostium of RCA, continuing into the   sinotubular junction and extending into proximal portions of the  ascending thoracic aorta. Normal size of the aortic root and all  portions of the thoracic aorta.  3. Optimum Fluoroscopic Angle for Delivery: RAO 1 CRA 0.  Ena Dawley  Electronically Signed  By: Ena Dawley  On: 10/13/2014 13:09       Study Result    EXAM:  OVER-READ INTERPRETATION CT CHEST  The following report is an over-read performed by radiologist Dr.  Sharion Dove [facility] on 10/12/2014. This  over-read does not include interpretation of cardiac or coronary  anatomy or pathology. The coronary calcium score/coronary CTA  interpretation by the cardiologist is attached.  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 N unusual recess  of the pericardium is also possible. MRI may be helpful for further  evaluation an characterization given the fact that it has continued  to enlarged since 2013.  The lungs demonstrate a mosaic pattern of ground-glass attenuation.  This is most typically seen with reactive airways disease such as  asthma. Other possibilities would include respiratory bronchiolitis  and cryptogenic organizing pneumonia. Patchy areas of atelectasis  are noted. No worrisome pulmonary mass lesions. Small nodules are  noted. There is a 5 mm nodule in the right upper lobe on image  number 20, a small nodule in the right lower lobe on image number 46  and adjacent smaller clustered nodules which are more likely part of  a tree-in-bud appearance.  No pleural effusion or pulmonary edema. Lingular and right middle  lobe atelectasis is noted. No bronchiectasis.  The visualized upper abdomen is grossly normal.  IMPRESSION:  1.  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 MR  of the mediastinum without and with contrast may be helpful for   further evaluation given the fact that it continues to enlarge.  2. Small pulmonary nodules are likely inflammatory. Recommend repeat  noncontrast chest CT in 6 months.  3. Mosaic pattern of ground-glass attenuation most likely due to  reactive airways disease.  Electronically Signed:  By: Kalman Jewels M.D.  On: 10/12/2014 15:09      CTA ABDOMEN AND PELVIS WITH CONTRAST  TECHNIQUE:  Multidetector CT imaging of the abdomen and pelvis was performed  using the standard protocol during bolus administration of  intravenous contrast. Multiplanar reconstructed images and MIPs were  obtained and reviewed to evaluate the vascular anatomy.  CONTRAST: 48mL OMNIPAQUE IOHEXOL 350 MG/ML SOLN  COMPARISON: CT of the abdomen and pelvis 09/21/2013.  FINDINGS:  CT ABDOMEN AND PELVIS FINDINGS  Lower chest: Minimal scarring or subsegmental atelectasis in lung  bases bilaterally.  Hepatobiliary: No focal hepatic lesion. No intra or extrahepatic  biliary ductal dilatation. Status post cholecystectomy.  Pancreas: Unremarkable.  Spleen: Spleen is enlarged measuring 4.7 x 14.6 x 12.5 cm (estimated  splenic volume of 429 mL).  Adrenals/Urinary Tract: Normal adrenal glands bilaterally. Sub cm  low-attenuation lesions in the kidneys bilaterally are too small to  definitively characterize, but favored to represent tiny cysts. No  hydroureteronephrosis or perinephric stranding to indicate urinary  tract obstruction at this time. Urinary bladder is unremarkable in  appearance.  Stomach/Bowel: The appearance of the stomach is normal. No  pathologic dilatation of the small bowel or colon.  Vascular/Lymphatic: Atherosclerosis throughout the abdominal and  pelvic vasculature, with findings and measurements pertinent to  potential TAVR procedure, as detailed below. No pathologically  enlarged lymph nodes are noted in the abdomen or pelvis.  Reproductive: Uterus and ovaries are unremarkable in appearance.   Other: No significant volume of ascites. No pneumoperitoneum.  Postoperative changes of mesh repair for ventral hernia. Notably,  and in the epigastric region there is a small recurrent ventral  hernia containing only omental fat. Additionally, there are 2 small  ventral hernias below the umbilicus, both of which contain only  omental fat.  Musculoskeletal: There are no aggressive appearing lytic or blastic  lesions noted in the visualized portions of the skeleton.  VASCULAR MEASUREMENTS PERTINENT TO TAVR:  AORTA:  Minimal Aortic Diameter - 10 x 14 mm mm  Severity of Aortic Calcification - moderate  RIGHT PELVIS:  Right Common Iliac Artery -  Minimal Diameter - 9.5 x 7.6 mm  Tortuosity - mild  Calcification - mild  Right External Iliac Artery -  Minimal Diameter - 7.5 x 5.9 mm  Tortuosity - mild  Calcification - none  Right Common Femoral Artery -  Minimal Diameter - 8.3 x 6.3 mm  Tortuosity - mild  Calcification - mild  LEFT PELVIS:  Left Common Iliac Artery -  Minimal Diameter - 6.4 x 8.6 mm  Tortuosity - mild  Calcification - mild  Left External Iliac Artery -  Minimal Diameter - 7.2 x 7.3 mm  Tortuosity - mild  Calcification - none  Left Common Femoral Artery -  Minimal Diameter - 8.1 x 5.6 mm  Tortuosity - mild  Calcification - mild  Review of the MIP images confirms the above findings.  IMPRESSION:  1. Findings and measurements pertinent to potential TAVR procedure,  as detailed above. This patient does appear to have suitable pelvic  arterial access.  2. Status  post mesh repair for ventral hernia in the upper abdomen,  with multiple ventral hernias in the epigastric region and low  anterior abdominal wall, as detailed above, each of which contains  only omental fat.  3. Splenomegaly.  4. Status post cholecystectomy.  5. Additional incidental findings, as above.  Electronically Signed  By: Vinnie Langton M.D.  On: 10/12/2014 15:56     Pulmonary  Function Tests  Baseline      Post-bronchodilator  FVC  1.63 L  (50% predicted) FVC  1.73 L  (54% predicted) FEV1  1.01 L  (40% predicted) FEV1  1.09 L  (43% predicted) FEF25-75 0.46 L  (18% predicted) FEF25-75 0.61 L  (25% predicted)  TLC  5.62 L  (118% predicted) RV  3.99 L  (222% predicted) DLCO  55% predicted   STS Risk Calculator  Procedure    AVR  Risk of Mortality   5.8% Morbidity or Mortality  29.6% Prolonged LOS   14.7% Short LOS    18.8% Permanent Stroke   0.6% Prolonged Vent Support  23.3% DSW Infection    0.7% Renal Failure    10.0% Reoperation    8.0%   Impression:  The patient has likely stage D severe symptomatic aortic stenosis with preserved left ventricular systolic function. She presents with long-standing history of chronic shortness of breath that is unquestionably multifactorial and consistent with a combination of both chronic diastolic congestive heart failure and severe chronic lung disease related to morbid obesity, OSA, and severe COPD with asthma.  Symptoms have progressed recently and follow-up transthoracic echocardiogram suggests the possibility of progression of the severity of aortic stenosis.  The patient has not been smoking for more than 10 years, she apparently has not gained any significant amount of weight above her baseline over the past year or so, and there are no other reasons to explain progression of symptoms.  I personally reviewed the patient's last few echocardiograms and diagnostic cardiac catheterization films. Sound transmission on transthoracic echocardiograms is poor because of the patient's body habitus. The patient clearly has aortic stenosis, although the aortic valve leaflets are only mildly thickened and difficult to visualize. Peak philosophy across the aortic valve has gone up significantly, that the patient has relatively small size aortic root and the peak velocity across the left ventricular outflow tract is elevated as  well. Review of the transesophageal echocardiogram performed in 2014 is notable for less impressive aortic stenosis with reasonable leaflet mobility. Moreover, transvalvular gradient measured at the time of recent catheterization is somewhat difficult to interpret with the peak to peak gradient measured 40 mmHg and mean transvalvular gradient only 12 mmHg.  It might be reasonable to consider repeating a transesophageal echocardiogram to further clarify the severity of aortic stenosis, ascertain whether or not aortic valve replacement seems appropriate at this time, and better characterize the potential benefits associated with valve replacement.  I would agree that the patient would be very high risk for conventional surgical aortic valve replacement with anticipated risk of mortality approaching 15% and risk of morbidity well above 50%.  Conventional surgical aortic valve replacement would likely require aortic root replacement because of the relatively small size of the patient's aortic root and the presence of significant calcification which might preclude root enlargement. Under the circumstances transcatheter aortic valve replacement might be an attractive treatment alternative, particular given the fact that TAVR would likely be associated with an optimal hemodynamic result and minimal residual gradient.  However, the patient is relatively young and  the long-term durability of TAVR in patients her age remains unclear.  Nevertheless, unless the patient were to find a way to lose a great deal of weight her life expectancy is probably not too long.   Plan:  The patient was counseled at length regarding treatment alternatives for management of severe symptomatic aortic stenosis. Alternative approaches such as conventional aortic valve or aortic root replacement, transcatheter aortic valve replacement, and palliative medical therapy were compared and contrasted at length.  The risks associated with  conventional surgical aortic valve replacement were been discussed in detail, as were expectations for post-operative convalescence. Long-term prognosis with medical therapy was discussed. This discussion was placed in the context of the patient's own specific clinical presentation and past medical history.  We discussed the possibility of proceeding with repeat transesophageal echocardiogram to further characterize the severity of aortic stenosis and whether or not it seems appropriate to proceed with valve replacement at this time. We will also refer her to the morbid obesity clinic to consider preliminary evaluation for possible weight loss surgery in the future after her cardiac issues have been resolved.  All of her questions been addressed.      I spent in excess of 90 minutes during the conduct of this office consultation and >50% of this time involved direct face-to-face encounter with the patient for counseling and/or coordination of their care.   Valentina Gu. Roxy Manns, MD 10/26/2014 11:14 AM

## 2014-11-01 ENCOUNTER — Telehealth: Payer: Self-pay | Admitting: *Deleted

## 2014-11-01 ENCOUNTER — Encounter: Payer: Self-pay | Admitting: *Deleted

## 2014-11-01 ENCOUNTER — Telehealth: Payer: Self-pay

## 2014-11-01 DIAGNOSIS — I350 Nonrheumatic aortic (valve) stenosis: Secondary | ICD-10-CM

## 2014-11-01 NOTE — Telephone Encounter (Signed)
[name] was given the information for Bariatrics and wellness services through [facility]. There coordinators [phone]. She will need to contact them for appointment. She states that she understands

## 2014-11-01 NOTE — Telephone Encounter (Signed)
Dr. Angelena Form has spoken with pt about TEE. Pt would like to proceed.  TEE scheduled for November 13, 2014 at 9:00 with Dr. Aundra Dubin. I spoke with pt and verbally went over all instructions with her.  She will come in for BMP and CBC on November 02, 2014.  Will leave printed copy of instructions at front desk and pt will pick up when here for lab work.

## 2014-11-02 ENCOUNTER — Other Ambulatory Visit (INDEPENDENT_AMBULATORY_CARE_PROVIDER_SITE_OTHER): Payer: Medicaid Other | Admitting: *Deleted

## 2014-11-02 DIAGNOSIS — I350 Nonrheumatic aortic (valve) stenosis: Secondary | ICD-10-CM

## 2014-11-03 LAB — CBC
HCT: 36.6 % (ref 36.0–46.0)
HEMOGLOBIN: 11.7 g/dL — AB (ref 12.0–15.0)
MCHC: 31.9 g/dL (ref 30.0–36.0)
MCV: 82.1 fl (ref 78.0–100.0)
Platelets: 243 10*3/uL (ref 150.0–400.0)
RBC: 4.46 Mil/uL (ref 3.87–5.11)
RDW: 16 % — AB (ref 11.5–15.5)
WBC: 8.8 10*3/uL (ref 4.0–10.5)

## 2014-11-03 LAB — BASIC METABOLIC PANEL
BUN: 13 mg/dL (ref 6–23)
CALCIUM: 9.2 mg/dL (ref 8.4–10.5)
CHLORIDE: 98 meq/L (ref 96–112)
CO2: 38 mEq/L — ABNORMAL HIGH (ref 19–32)
CREATININE: 1 mg/dL (ref 0.4–1.2)
GFR: 58.43 mL/min — AB (ref >60.00)
Glucose, Bld: 165 mg/dL — ABNORMAL HIGH (ref 70–99)
Potassium: 4.3 mEq/L (ref 3.5–5.1)
Sodium: 140 mEq/L (ref 135–145)

## 2014-11-13 ENCOUNTER — Ambulatory Visit (HOSPITAL_COMMUNITY): Admission: RE | Admit: 2014-11-13 | Payer: Medicaid Other | Source: Ambulatory Visit | Admitting: Cardiology

## 2014-11-13 ENCOUNTER — Telehealth: Payer: Self-pay | Admitting: Cardiovascular Disease

## 2014-11-13 ENCOUNTER — Encounter (HOSPITAL_COMMUNITY): Admission: RE | Payer: Self-pay | Source: Ambulatory Visit

## 2014-11-13 SURGERY — ECHOCARDIOGRAM, TRANSESOPHAGEAL
Anesthesia: Moderate Sedation

## 2014-11-13 NOTE — Progress Notes (Addendum)
Patient scheduled for TEE this am. Patient was called to make sure she was coming for procedure. Patient stated that she thought the procedure was scheduled on Thursday and she would be unable to come in today. Office made aware to reschedule.    Best number to reach patient is [phone].

## 2014-11-13 NOTE — Telephone Encounter (Signed)
Kathlee Nations from [facility] calling stating pt wrote wrong date down for her TEE.  Spoke w/ [name] who would like to do the proecedure tomorrow or Thursday.  Scheduled for tomorrow 11/17 at 7:30.  She is clear on date and time and where she needs to go.

## 2014-11-13 NOTE — Telephone Encounter (Signed)
New message     [facility] calling stating patient wrote the wrong date down . Patient will need to be reschedule.

## 2014-11-14 ENCOUNTER — Encounter (HOSPITAL_COMMUNITY): Payer: Self-pay

## 2014-11-14 ENCOUNTER — Ambulatory Visit (HOSPITAL_COMMUNITY)
Admission: RE | Admit: 2014-11-14 | Discharge: 2014-11-14 | Disposition: A | Discharge status: Home / Self Care | Payer: Medicaid Other | Source: Ambulatory Visit | Attending: Cardiology | Admitting: Cardiology

## 2014-11-14 ENCOUNTER — Encounter (HOSPITAL_COMMUNITY)
Admission: RE | Disposition: A | Discharge status: Home / Self Care | Payer: Self-pay | Source: Ambulatory Visit | Attending: Cardiology

## 2014-11-14 DIAGNOSIS — Z86711 Personal history of pulmonary embolism: Secondary | ICD-10-CM | POA: Insufficient documentation

## 2014-11-14 DIAGNOSIS — Q231 Congenital insufficiency of aortic valve: Secondary | ICD-10-CM | POA: Diagnosis not present

## 2014-11-14 DIAGNOSIS — I350 Nonrheumatic aortic (valve) stenosis: Secondary | ICD-10-CM | POA: Insufficient documentation

## 2014-11-14 DIAGNOSIS — Z6841 Body Mass Index (BMI) 40.0 and over, adult: Secondary | ICD-10-CM | POA: Diagnosis not present

## 2014-11-14 DIAGNOSIS — Z952 Presence of prosthetic heart valve: Secondary | ICD-10-CM | POA: Insufficient documentation

## 2014-11-14 DIAGNOSIS — I10 Essential (primary) hypertension: Secondary | ICD-10-CM | POA: Diagnosis not present

## 2014-11-14 DIAGNOSIS — G2581 Restless legs syndrome: Secondary | ICD-10-CM | POA: Insufficient documentation

## 2014-11-14 DIAGNOSIS — K759 Inflammatory liver disease, unspecified: Secondary | ICD-10-CM | POA: Diagnosis not present

## 2014-11-14 DIAGNOSIS — F329 Major depressive disorder, single episode, unspecified: Secondary | ICD-10-CM | POA: Insufficient documentation

## 2014-11-14 DIAGNOSIS — J45909 Unspecified asthma, uncomplicated: Secondary | ICD-10-CM | POA: Insufficient documentation

## 2014-11-14 DIAGNOSIS — G4733 Obstructive sleep apnea (adult) (pediatric): Secondary | ICD-10-CM | POA: Diagnosis not present

## 2014-11-14 DIAGNOSIS — E119 Type 2 diabetes mellitus without complications: Secondary | ICD-10-CM | POA: Diagnosis not present

## 2014-11-14 DIAGNOSIS — I252 Old myocardial infarction: Secondary | ICD-10-CM | POA: Insufficient documentation

## 2014-11-14 DIAGNOSIS — Z87891 Personal history of nicotine dependence: Secondary | ICD-10-CM | POA: Diagnosis not present

## 2014-11-14 DIAGNOSIS — I5032 Chronic diastolic (congestive) heart failure: Secondary | ICD-10-CM | POA: Diagnosis not present

## 2014-11-14 DIAGNOSIS — R0902 Hypoxemia: Secondary | ICD-10-CM | POA: Insufficient documentation

## 2014-11-14 DIAGNOSIS — I352 Nonrheumatic aortic (valve) stenosis with insufficiency: Secondary | ICD-10-CM

## 2014-11-14 DIAGNOSIS — E785 Hyperlipidemia, unspecified: Secondary | ICD-10-CM | POA: Insufficient documentation

## 2014-11-14 DIAGNOSIS — J449 Chronic obstructive pulmonary disease, unspecified: Secondary | ICD-10-CM | POA: Insufficient documentation

## 2014-11-14 DIAGNOSIS — I2510 Atherosclerotic heart disease of native coronary artery without angina pectoris: Secondary | ICD-10-CM | POA: Diagnosis not present

## 2014-11-14 DIAGNOSIS — I2699 Other pulmonary embolism without acute cor pulmonale: Secondary | ICD-10-CM | POA: Diagnosis not present

## 2014-11-14 DIAGNOSIS — Z96651 Presence of right artificial knee joint: Secondary | ICD-10-CM | POA: Diagnosis not present

## 2014-11-14 HISTORY — PX: TEE WITHOUT CARDIOVERSION: SHX5443

## 2014-11-14 LAB — GLUCOSE, CAPILLARY: Glucose-Capillary: 148 mg/dL — ABNORMAL HIGH (ref 70–99)

## 2014-11-14 SURGERY — ECHOCARDIOGRAM, TRANSESOPHAGEAL
Anesthesia: Moderate Sedation

## 2014-11-14 MED ORDER — FENTANYL CITRATE 0.05 MG/ML IJ SOLN
INTRAMUSCULAR | Status: DC | PRN
  Administered 2014-11-14: 25 ug via INTRAVENOUS

## 2014-11-14 MED ORDER — SODIUM CHLORIDE 0.9 % IV SOLN: INTRAVENOUS | Status: DC

## 2014-11-14 MED ORDER — SODIUM CHLORIDE 0.9 % IV SOLN
INTRAVENOUS | Status: DC
  Administered 2014-11-14: 08:00:00 via INTRAVENOUS

## 2014-11-14 MED ORDER — MIDAZOLAM HCL 10 MG/2ML IJ SOLN
INTRAMUSCULAR | Status: DC | PRN
  Administered 2014-11-14: 2 mg via INTRAVENOUS
  Administered 2014-11-14: 1 mg via INTRAVENOUS

## 2014-11-14 MED ORDER — BUTAMBEN-TETRACAINE-BENZOCAINE 2-2-14 % EX AERO
INHALATION_SPRAY | CUTANEOUS | Status: DC | PRN
  Administered 2014-11-14: 2 via TOPICAL

## 2014-11-14 NOTE — CV Procedure (Signed)
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 Kirk Ruths

## 2014-11-14 NOTE — Interval H&P Note (Signed)
History and Physical Interval Note:  11/14/2014 9:36 AM  [name]  has presented today for surgery, with the diagnosis of AOTIC STENOSIS  The various methods of treatment have been discussed with the patient and family. After consideration of risks, benefits and other options for treatment, the patient has consented to  Procedure(s): TRANSESOPHAGEAL ECHOCARDIOGRAM (TEE) (N/A) as a surgical intervention .  The patient's history has been reviewed, patient examined, no change in status, stable for surgery.  I have reviewed the patient's chart and labs.  Questions were answered to the patient's satisfaction.     Kirk Ruths

## 2014-11-14 NOTE — Progress Notes (Signed)
Echocardiogram Echocardiogram Transesophageal has been performed.  [name] 11/14/2014, 10:17 AM

## 2014-11-14 NOTE — H&P (View-Only) (Signed)
[facility]  MULTIDISCIPLINARY HEART VALVE CLINIC    CARDIOTHORACIC SURGERY CONSULTATION REPORT  Referring Provider is Lauree Chandler D*MD PCP is Maggie Font, MD  Chief Complaint  Patient presents with  . NEW CARDIAC    2nd TAVR CONSULT    HPI:  Patient is a 53 year old morbidly obese white female from [place] with multiple chronic medical problems including aortic stenosis, coronary artery disease, COPD with asthma, obstructive sleep apnea, hypertension, hyperlipidemia, and degenerative arthritis who has been referred for a second surgical opinion to discuss treatment options for the management of severe symptomatically aortic stenosis. The patient states that she has been told that she had a heart murmur beginning in childhood. She has no documented history of rheumatic fever, but she states that she had a severe illness during childhood that lasted for more than a week and left her bedridden for extended period of time. She has struggled with morbid obesity for all of her adult life and over time she has developed progressive dyspnea with exertion. In 2010 she suffered an acute myocardial infarction for which she was treated with PCI and stenting of the distal right coronary artery using a drug eluding stent.  In February 2013 she underwent right total knee replacement, after which she developed acute exacerbation of shortness of breath. She was diagnosed with deep venous thrombosis and pulmonary embolus and she was treated with Coumadin for 6 months. In February 2014 she presented with worsening shortness of breath. CTA of the chest was notable for the absence of recurrent or persistent pulmonary embolus. Transthoracic echocardiogram performed at that time demonstrated the presence of aortic stenosis with peak velocity across the aortic valve reported 3.7 m/sec with mean transvalvular gradient estimated 40 mmHg.  The peak velocity LVOT/aortic valve ratio was 0.29.   The patient underwent left and right heart catheterization 03/17/2013. The previously placed stent in the right coronary artery was patent and there was no other significant flow limiting disease.  Peak to peak and mean transvalvular gradients across the aortic valve were measured 31 and 22 mmHg, respectively. Aortic valve area was calculated 1.15 cm. There was moderate pulmonary hypertension.  The patient subsequently underwent transesophageal echocardiogram. By planimetry the aortic valve area was measured between 1.2 and 1.4 cm. The mean transvalvular gradient was 29 mmHg. Medical therapy was recommended. Recently the patient returned for follow-up to see Dr. Angelena Form complaining of worsening symptoms of shortness of breath. Follow-up transthoracic echocardiogram suggested significant progression of the severity of aortic stenosis with peak velocity across the aortic valve was reported 4.3 m/s and mean transvalvular gradient estimated 50 mmHg. The peak velocity LVOT/aortic valve ratio was 0.34.  Left ventricular ejection fraction remained normal at 60%. The patient subsequently underwent repeat left and right heart catheterization. This revealed the development of 70-80% ostial stenosis of the right coronary artery but continued patency of the stent in the distal right coronary artery.  There was no other significant flow limiting coronary artery disease. Peak to peak and mean transvalvular gradients across aortic valve were reported 46 and 12 mmHg, respectively.  There remained moderate pulmonary hypertension. The patient was referred for surgical consultation and evaluated by Dr. Cyndia Bent. He felt that the patient was a poor candidate for conventional surgery and discussed transcatheter aortic valve replacement as an alternative.  Since then the patient has undergone extensive diagnostic workup to consider transcatheter aortic valve replacement as an alternative. The patient has been referred for a second  surgical opinion to discuss treatment  options.  The patient is married and lives with her husband, son, and daughter in law locally in [place]. She lives a very sedentary lifestyle. She has been chronically limited by morbid obesity and all associated complications including chronic dyspnea with exertion, obstructive sleep apnea, GE reflux disease, and severe degenerative arthritis. She describes a long history of shortness of breath which has gradually progressed over many years. She now gets short of breath with minimal activity and occasionally at rest. She cannot lay flat in bed. She occasionally wakes up in the middle night feeling short of breath. She has occasional tightness across her chest in association with episodes of severe shortness of breath. She otherwise does not have much chest pain or chest tightness. She has occasional dizzy spells without syncope. She has moderate chronic bilateral lower extremity edema.  She has a long history of chronic lung disease with history of heavy tobacco abuse in the past, although she quit smoking in 2005. She has severe COPD with asthma, obstructive sleep apnea, and nocturnal hypoxemia. She has been using home oxygen at night for many years and recently has been using it more frequently during the day.  Her physical mobility is limited primarily by dyspnea. She has chronic arthritis involving both knees and her back but she is able to ambulate without the assistance of a cane or walker.  She has never been formally evaluated to consider weight loss surgery but she is interested in considering it in the future.    Past Medical History  Diagnosis Date  . Asthma   . Hypertension   . Hyperlipidemia   . GERD (gastroesophageal reflux disease)   . Depression   . Blood transfusion     2013[facility]  . Headache(784.0)     h/o migraines  . Pulmonary embolism 02/08/12    S/P RT TOTAL KNEE ON 02/03/12--ON 02/08/12--DEVELOPED ACUTE SOB AND CHEST PAIN--AND DIAGNOSED  WITH  PULMONARY EMBOLUS AND PNEUMONIA  . Restless leg syndrome   . Myocardial infarction     PT THINKS SHE WAS DX WITH MI AT THE TIME OF HEART STENTING  . CAD (coronary artery disease)     Cath 2010 with DES x 1 RCA-- PT'S CARDIOLOGIST IS DR. Arboles  . Uterine fibroid     NO PROBLEMS AT PRESENT FROM THE FIBROIDS-STATES SHE IS POST MENOPAUSAL-LAST MENSES 2010 EXCEPT FOR EPISODE THIS YR OF BLEEDING RELATED TO FIBROIDS.  Marland Kitchen Heart murmur   . Pneumonia 02/08/12    [facility]IZED AT [facility] WITH PNEUMONIA AND WITH PULMONARY EMBOLUS  . Diabetes mellitus DIAGNOSED IN2010    ORAL MEDS  . Arthritis     PAIN AND SEVERE OA LEFT KNEE ; S/P RIGHT TKA ON 02/03/12; HAS LOWER BACK PAIN-UNABLE TO STAND MORE THAN 10 MIN; ARTHRITIS "ALL OVER"  . Neck pain   . Weakness     BOTH HANDS - S/P BILATERAL CARPAL TUNNEL RELEASE--BUT STILL HAS WEAKNESS--OFTEN DROPS THINGS  . Shortness of breath     with exertion  . Eczema     on back  . Kidney stones   . Anemia   . COPD (chronic obstructive pulmonary disease)   . Sleep apnea     uses 3 liters O2 at night   . Aortic valve stenosis, severe   . Aortic stenosis   . Morbid obesity with body mass index of 60.0-69.9 in adult   . Chronic diastolic congestive heart failure     Past Surgical History  Procedure Laterality Date  .  Tubal ligation    . Carpal tunnel release      Bilateral  . Cholecystectomy    . Hernia repair    . Cardiac catheterization    . Knee arthroplasty  02/03/2012    Procedure: COMPUTER ASSISTED TOTAL KNEE ARTHROPLASTY;  Surgeon: Mcarthur Rossetti, MD;  Location: [place];  Service: Orthopedics;  Laterality: Right;  Right total knee arthroplasty  . Coronary angioplasty      2010 has stent in place  . Total knee arthroplasty  09/10/2012    Procedure: TOTAL KNEE ARTHROPLASTY;  Surgeon: Mcarthur Rossetti, MD;  Location: [facility];  Service: Orthopedics;  Laterality: Left;  . Trigger finger release  09/10/2012    Procedure: RELEASE TRIGGER  FINGER/A-1 PULLEY;  Surgeon: Mcarthur Rossetti, MD;  Location: [facility];  Service: Orthopedics;  Laterality: Right;  Right Ring Finger  . Tee without cardioversion N/A 03/14/2013    Procedure: TRANSESOPHAGEAL ECHOCARDIOGRAM (TEE);  Surgeon: Lelon Perla, MD;  Location: [facility]OSCOPY;  Service: Cardiovascular;  Laterality: N/A;  . Joint replacement      bil total knees  . Total knee revision Right 07/15/2013    Procedure: REVISION ARTHROPLASTY RIGHT KNEE;  Surgeon: Mcarthur Rossetti, MD;  Location: [facility];  Service: Orthopedics;  Laterality: Right;  . Cystoscopy w/ retrogrades Right 09/21/2013    Procedure: CYSTOSCOPY WITH RIGHT RETROGRADE PYELOGRAM RIGHT DOUBLE J STENT ;  Surgeon: Fredricka Bonine, MD;  Location: [facility];  Service: Urology;  Laterality: Right;  . Cystoscopy with ureteroscopy and stent placement Right 10/25/2013    Procedure: CYSTOSCOPY RIGHT URETEROSCOPY HOLMIUM LASER LITHO AND STENT PLACEMENT;  Surgeon: Fredricka Bonine, MD;  Location: [facility];  Service: Urology;  Laterality: Right;    Family History  Problem Relation Age of Onset  . Breast cancer Mother   . COPD Father     smoked  . Cancer Brother     Sinus  . Emphysema Mother     smoked  . Asthma Father   . Heart disease Mother   . Heart disease Father     History   Social History  . Marital Status: Married    Spouse Name: N/A    Number of Children: 2  . Years of Education: N/A   Occupational History  . Disabled    Social History Main Topics  . Smoking status: Former Smoker -- 1.50 packs/day for 30 years    Types: Cigarettes    Quit date: 12/29/2000  . Smokeless tobacco: Never Used  . Alcohol Use: No  . Drug Use: No  . Sexual Activity: Yes    Birth Control/ Protection: Surgical   Other Topics Concern  . Not on file   Social History Narrative  . No narrative on file    Current Outpatient Prescriptions  Medication Sig Dispense Refill  . albuterol (PROVENTIL HFA;VENTOLIN  HFA) 108 (90 BASE) MCG/ACT inhaler Inhale 2 puffs into the lungs every 4 (four) hours as needed for wheezing or shortness of breath (((PLAN B))).       Marland Kitchen albuterol (PROVENTIL) (2.5 MG/3ML) 0.083% nebulizer solution Take 2.5 mg by nebulization every 4 (four) hours as needed for wheezing or shortness of breath (((PLAN C))).       Marland Kitchen aspirin EC 325 MG tablet Take 325 mg by mouth 2 (two) times daily.      Marland Kitchen atorvastatin (LIPITOR) 80 MG tablet Take 80 mg by mouth daily.      . budesonide-formoterol (SYMBICORT) 160-4.5 MCG/ACT  inhaler Inhale 2 puffs into the lungs 2 (two) times daily.      . clopidogrel (PLAVIX) 75 MG tablet Take 75 mg by mouth every morning.       . escitalopram (LEXAPRO) 20 MG tablet Take 20 mg by mouth every morning.       . ezetimibe (ZETIA) 10 MG tablet Take 10 mg by mouth daily.      . furosemide (LASIX) 40 MG tablet Take 40 mg by mouth 2 (two) times daily.      Marland Kitchen gabapentin (NEURONTIN) 300 MG capsule Take 300 mg by mouth 2 (two) times daily.       Marland Kitchen glipiZIDE (GLUCOTROL) 5 MG tablet Take 5 mg by mouth daily before supper.       . losartan (COZAAR) 100 MG tablet Take 100 mg by mouth daily with breakfast.       . MAGNESIUM PO Take 1-2 tablets by mouth daily as needed (constipation).       . Multiple Vitamin (MULTIVITAMIN) tablet Take 1 tablet by mouth daily.      Donell Sievert IN Inhale into the lungs.      . potassium chloride SA (K-DUR,KLOR-CON) 20 MEQ tablet Take 40 mEq by mouth daily.       No current facility-administered medications for this visit.    No Known Allergies    Review of Systems:   General:  Apparently good appetite, decreased energy, + weight gain, no weight loss, no fever  Cardiac:  + chest pain with exertion, no chest pain at rest, + SOB with minimal exertion, + occasional resting SOB, + PND, + orthopnea, no palpitations, no arrhythmia, no atrial fibrillation, + LE edema, + dizzy spells, no syncope  Respiratory:  + chronic shortness of breath, + home  oxygen, no productive cough, + dry cough, no bronchitis, + wheezing, no hemoptysis, + asthma, no pain with inspiration or cough, + sleep apnea, no CPAP at night  GI:   no difficulty swallowing, + reflux, no frequent heartburn, no hiatal hernia, no abdominal pain, + constipation, no diarrhea, no hematochezia, no hematemesis, no melena  GU:   no dysuria,  no frequency, + occasional urinary tract infection, no hematuria, no kidney stones, no kidney disease  Vascular:  no pain suggestive of claudication, no pain in feet, + leg cramps, no varicose veins, + DVT, no non-healing foot ulcer  Neuro:   no stroke, no TIA's, no seizures, + headaches, no temporary blindness one eye,  no slurred speech, no peripheral neuropathy, + mild chronic pain, mild instability of gait, no memory/cognitive dysfunction  Musculoskeletal: + arthritis, no joint swelling, no myalgias, mild difficulty walking, decreased mobility   Skin:   + rash under pannus during the summer months, no itching, no skin infections, no pressure sores or ulcerations  Psych:   no anxiety, + depression, no nervousness, no unusual recent stress  Eyes:   + blurry vision, + floaters, + recent vision changes, + wears glasses or contacts  ENT:   no hearing loss, no loose or painful teeth, + partial dentures, last saw dentist March 2015  Hematologic:  + easy bruising, no abnormal bleeding, no clotting disorder, no frequent epistaxis  Endocrine:  + diabetes, occasionally checks CBG's at home           Physical Exam:   BP 138/81  Pulse 99  Ht 5' (1.524 m)  Wt 350 lb (158.759 kg)  BMI 68.35 kg/m2  SpO2 97%  LMP 07/26/2009  General:  Morbidly obese and chronically ill-appearing  HEENT:  Unremarkable   Neck:   no JVD, no bruits, no adenopathy   Chest:   clear to auscultation, symmetrical breath sounds, no wheezes, no rhonchi   CV:   RRR, grade II/VI crescendo/decrescendo murmur heard best at RUSB,  no diastolic murmur  Abdomen:  soft, non-tender,  no masses   Extremities:  warm, well-perfused, pulses not palpable, mild LE edema  Rectal/GU  Deferred  Neuro:   Grossly non-focal and symmetrical throughout  Skin:   Clean and dry, no rashes, no breakdown   Diagnostic Tests:  Transthoracic Echocardiography  Patient: Quinnetta, Roepke MR #: [id number] Study Date: 02/01/2013 Gender: F Age: 56 Height: 160cm Weight: 151.5kg BSA: 2.3m^2 Pt. Status: Room:  ATTENDING [place] Thereasa Solo, Harvette C SONOGRAPHER Cindy Hazy, RDCS PERFORMING Zacarias Pontes, Site 3 ORDERING [place], Christopher REFERRING [place], Christopher cc:  ------------------------------------------------------------ LV EF: 60% - 65%  ------------------------------------------------------------ Indications: 424.1 Aortic valve disorders.  ------------------------------------------------------------ History: PMH: Acquired from the patient and from the patient's chart. PMH: Chronic Kidney Disease. Hepatitis. Obstructive sleep apnea. Pulmonary embolism. Pneumonia. COPD. Risk factors: Former tobacco use. Hypertension. Diabetes mellitus. Morbidly obese. Dyslipidemia.  ------------------------------------------------------------ Study Conclusions  - Left ventricle: The cavity size was normal. Wall thickness was increased in a pattern of mild LVH. Systolic function was normal. The estimated ejection fraction was in the range of 60% to 65%. Wall motion was normal; there were no regional wall motion abnormalities. Doppler parameters are consistent with abnormal left ventricular relaxation (grade 1 diastolic dysfunction). - Aortic valve: There was severe stenosis. Trivial regurgitation. - Mitral valve: Calcified annulus.  ------------------------------------------------------------ Labs, prior tests, procedures, and surgery: Cath-2010. Right Knee Replacement-Feb 06, 2012. Transthoracic echocardiography. M-mode, complete 2D, spectral  Doppler, and color Doppler. Height: Height: 160cm. Height: 63in. Weight: Weight: 151.5kg. Weight: 333.3lb. Body mass index: BMI: 59.2kg/m^2. Body surface area: BSA: 2.11m^2. Blood pressure: 134/76. Patient status: Outpatient. Location:  Site 3  ------------------------------------------------------------  ------------------------------------------------------------ Left ventricle: The cavity size was normal. Wall thickness was increased in a pattern of mild LVH. Systolic function was normal. The estimated ejection fraction was in the range of 60% to 65%. Wall motion was normal; there were no regional wall motion abnormalities. Doppler parameters are consistent with abnormal left ventricular relaxation (grade 1 diastolic dysfunction).  ------------------------------------------------------------ Aortic valve: Poorly visualized. Mildly calcified leaflets. Doppler: There was severe stenosis. Trivial regurgitation. VTI ratio of LVOT to aortic valve: 0.3. Valve area: 0.75cm^2(VTI). Indexed valve area: 0.31cm^2/m^2 (VTI). Peak velocity ratio of LVOT to aortic valve: 0.29. Valve area: 0.74cm^2 (Vmax). Indexed valve area: 0.31cm^2/m^2 (Vmax). Mean gradient: 2mm Hg (S). Peak gradient: 66mm Hg (S).  ------------------------------------------------------------ Aorta: Aortic root: The aortic root was normal in size.  ------------------------------------------------------------ Mitral valve: Calcified annulus. Mobility was not restricted. Doppler: Transvalvular velocity was within the normal range. There was no evidence for stenosis. No regurgitation. Peak gradient: 40mm Hg (D).  ------------------------------------------------------------ Left atrium: The atrium was normal in size.  ------------------------------------------------------------ Right ventricle: The cavity size was normal. Systolic function was  normal.  ------------------------------------------------------------ Pulmonic valve: Doppler: Transvalvular velocity was within the normal range. There was no evidence for stenosis.  ------------------------------------------------------------ Tricuspid valve: Structurally normal valve. Doppler: Transvalvular velocity was within the normal range. No regurgitation.  ------------------------------------------------------------ Right atrium: The atrium was normal in size.  ------------------------------------------------------------ Pericardium: There was no pericardial effusion.  ------------------------------------------------------------ Systemic veins: Inferior vena cava: The vessel was normal in size.  ------------------------------------------------------------  2D measurements Normal Doppler measurements Normal Left ventricle  Left ventricle LVID ED, 44.6 mm 43-52 Ea, lat 8.22 cm/s ------ chord, ann, tiss PLAX DP LVID ES, 29.4 mm 23-38 E/Ea, lat 11.2 ------ chord, ann, tiss PLAX DP FS, chord, 34 % >29 Ea, med 6.58 cm/s ------ PLAX ann, tiss LVPW, ED 13.6 mm ------ DP IVS/LVPW 1.02 <1.3 E/Ea, med 14 ------ ratio, ED ann, tiss Ventricular septum DP IVS, ED 13.9 mm ------ LVOT LVOT Peak vel, 109 cm/s ------ Diam, S 18 mm ------ S Area 2.54 cm^2 ------ VTI, S 22.2 cm ------ Diam 18 mm ------ Stroke vol 56.5 ml ------ Aorta Stroke 23.5 ml/m^2 ------ Root diam, 25 mm ------ index ED Aortic valve Left atrium Peak vel, 376 cm/s ------ AP dim 36 mm ------ S AP dim 1.5 cm/m^2 <2.2 Mean vel, 305 cm/s ------ index S VTI, S 75.1 cm ------ Mean 40 mm Hg ------ gradient, S Peak 57 mm Hg ------ gradient, S VTI ratio 0.3 ------ LVOT/AV Area, VTI 0.75 cm^2 ------ Area index 0.31 cm^2/m ------ (VTI) ^2 Peak vel 0.29 ------ ratio, LVOT/AV Area, Vmax 0.74 cm^2 ------ Area index 0.31 cm^2/m ------ (Vmax) ^2 Mitral valve Peak E vel 92.1 cm/s ------ Peak A vel 107  cm/s ------ Decelerati 232 ms 150-23 on time 0 Peak 3 mm Hg ------ gradient, D Peak E/A 0.9 ------ ratio Right ventricle Sa vel, 16.4 cm/s ------ lat ann, tiss DP  ------------------------------------------------------------ Prepared and Electronically Authenticated by  Kirk Ruths 2014-02-04T10:46:[id number]    Transesophageal Echocardiography  Patient: Syd, Manges MR #: [id number] Study Date: 03/14/2013 Gender: F Age: 20 Height: 160cm Weight: 148.6kg BSA: 2.36m^2 Pt. Status: Room: [facility]  ADMITTING Kirk Ruths ATTENDING [place], Aaron Edelman PERFORMING Kirk Ruths SONOGRAPHER Wyatt Mage, RDCS ORDERING [place], Christopher Parks Ranger, Christopher cc:  ------------------------------------------------------------ LV EF: 60% - 65%  ------------------------------------------------------------ Indications: Aortic stenosis 424.1.  ------------------------------------------------------------ Study Conclusions  - Left ventricle: Systolic function was normal. The estimated ejection fraction was in the range of 60% to 65%. Wall motion was normal; there were no regional wall motion abnormalities. - Aortic valve: There was moderate stenosis. Mild regurgitation. - Mitral valve: No evidence of vegetation. - Left atrium: The atrium was mildly dilated. No evidence of thrombus in the atrial cavity or appendage. - Atrial septum: Doppler showed no right-to-left atrial level shunt. - Tricuspid valve: No evidence of vegetation. Transesophageal echocardiography. 2D and color Doppler. Height: Height: 160cm. Height: 63in. Weight: Weight: 148.6kg. Weight: 327lb. Body mass index: BMI: 58kg/m^2. Body surface area: BSA: 2.58m^2. Blood pressure: 148/95. Patient status: Outpatient. Location: Endoscopy.  ------------------------------------------------------------  ------------------------------------------------------------ Left ventricle: Systolic function  was normal. The estimated ejection fraction was in the range of 60% to 65%. Wall motion was normal; there were no regional wall motion abnormalities.  ------------------------------------------------------------ Aortic valve: Mean gradient 29 mm Hg. Aortic valve area by planimetry 1.2 - 1.4 cm. Trileaflet. Doppler: There was moderate stenosis. Mild regurgitation. Mean gradient: 55mm Hg (S). Peak gradient: 72mm Hg (S).  ------------------------------------------------------------ Aorta: There was moderate atheromatous plaque in the descending aorta.  ------------------------------------------------------------ Mitral valve: Structurally normal valve. Leaflet separation was normal. No evidence of vegetation. Doppler: Trivial regurgitation.  ------------------------------------------------------------ Left atrium: The atrium was mildly dilated. No evidence of thrombus in the atrial cavity or appendage.  ------------------------------------------------------------ Atrial septum: Doppler showed no right-to-left atrial level shunt.  ------------------------------------------------------------ Right ventricle: The cavity size was normal. Systolic function was normal.  ------------------------------------------------------------ Pulmonic valve: Structurally normal valve. Cusp separation was normal.  ------------------------------------------------------------ Tricuspid valve: Structurally normal valve. Leaflet separation was normal. No evidence  of vegetation. Doppler: Mild regurgitation.  ------------------------------------------------------------ Right atrium: The atrium was normal in size.  ------------------------------------------------------------ Pericardium: There was no pericardial effusion.  ------------------------------------------------------------  Doppler measurements Normal Aortic valve Peak vel, S 320 cm/s ------ Mean vel, S 235 cm/s ------ VTI, S 79.5 cm  ------ Mean 29 mm ------ gradient, S Hg Peak 41 mm ------ gradient, S Hg  ------------------------------------------------------------ Prepared and Electronically Authenticated by  Kirk Ruths 2014-03-17T14:09:[id number]    Transthoracic Echocardiography  Patient: James, Lafalce MR #: [id number] Study Date: 08/10/2014 Gender: F Age: 72 Height: 160 cm Weight: 159.7 kg BSA: 2.78 m^2 Pt. Status: Room:  ATTENDING Darlina Guys, MD SONOGRAPHER Cindy Hazy, RDCS ORDERING [place], [place], Outpatient  cc:  ------------------------------------------------------------------- LV EF: 60% - 65%  ------------------------------------------------------------------- Indications: 424.1 Aortic valve disorders.  ------------------------------------------------------------------- History: PMH: Acquired from the patient and from the patient&'s chart. PMH: History of Pulmonary Embolism. CAD. COPD. Obstructive Sleep Apnea. Shortness of breath. Pneumonia. Murmur. Risk factors: Former tobacco use. Hypertension. Diabetes mellitus. Morbidly obese. Dyslipidemia.  ------------------------------------------------------------------- Study Conclusions  - Left ventricle: The cavity size was normal. Wall thickness was normal. Systolic function was normal. The estimated ejection fraction was in the range of 60% to 65%. Doppler parameters are consistent with abnormal left ventricular relaxation (grade 1 diastolic dysfunction). - Aortic valve: AV is difficult to see well From right upper parasternal window peak and mean gradients through the valve are 77 and 50 mm Hg respectively consistent with severe/critical AS. This is increased from echo of 2014.  Transthoracic echocardiography. M-mode, complete 2D, spectral Doppler, and color Doppler. Birthdate: Patient birthdate: 1961/07/21. Age: Patient is 53 yr old. Sex: Gender:  female. BMI: 62.4 kg/m^2. Blood pressure: 120/70 Patient status: Outpatient. Study date: Study date: 08/10/2014. Study time: 01:23 PM. Location: [facility]  -------------------------------------------------------------------  ------------------------------------------------------------------- Left ventricle: The cavity size was normal. Wall thickness was normal. Systolic function was normal. The estimated ejection fraction was in the range of 60% to 65%. Doppler parameters are consistent with abnormal left ventricular relaxation (grade 1 diastolic dysfunction).  ------------------------------------------------------------------- Aortic valve: AV is difficult to see well From right upper parasternal window peak and mean gradients through the valve are 77 and 50 mm Hg respectively consistent with severe/critical AS. This is increased from echo of 2014. Mildly thickened, mildly calcified leaflets. Doppler: There was no significant regurgitation. VTI ratio of LVOT to aortic valve: 0.34. Valve area (VTI): 1.07 cm^2. Indexed valve area (VTI): 0.39 cm^2/m^2. Valve area (Vmax): 1.02 cm^2. Indexed valve area (Vmax): 0.37 cm^2/m^2. Mean gradient (S): 52 mm Hg. Peak gradient (S): 77 mm Hg.  ------------------------------------------------------------------- Mitral valve: Calcified annulus. Mildly thickened leaflets . Doppler: There was trivial regurgitation. Peak gradient (D): 5 mm Hg.  ------------------------------------------------------------------- Right ventricle: The cavity size was normal. Wall thickness was normal. Systolic function was normal.  ------------------------------------------------------------------- Right atrium: The atrium was normal in size.  ------------------------------------------------------------------- Pericardium: There was no pericardial effusion.  ------------------------------------------------------------------- Systemic veins: Inferior vena  cava: The vessel was mildly dilated. The respirophasic diameter changes were in the normal range (= 50%), consistent with normal central venous pressure.  ------------------------------------------------------------------- Measurements  Left ventricle Value 03/14/2013 Reference LV ID, ED, PLAX 52 mm ---------- 43 - 52 chordal LV ID, ES, PLAX 29 mm ---------- 23 - 38 chordal LV fx shortening, 44 % ---------- >=29 PLAX chordal LV PW thickness, ED 10 mm ---------- --------- IVS/LV PW ratio, ED 1 ---------- <=1.3 Stroke volume, 2D 91 ml ---------- --------- Stroke volume/bsa, 33  ml/m^2 ---------- --------- 2D LV e&', lateral 8.7 cm/s ---------- --------- LV E/e&', lateral 12.3 ---------- --------- LV e&', medial 7.4 cm/s ---------- --------- LV E/e&', medial 14.46 ---------- --------- LV e&', average 8.05 cm/s ---------- --------- LV E/e&', average 13.29 ---------- ---------  Ventricular septum Value 03/14/2013 Reference IVS thickness, ED 10 mm ---------- ---------  LVOT Value 03/14/2013 Reference LVOT ID, S 20 mm ---------- --------- LVOT area 3.14 cm^2 ---------- --------- LVOT VTI, S 29.1 cm ---------- ---------  Aortic valve Value 03/14/2013 Reference Aortic valve peak 438 cm/s 320 --------- velocity, S Aortic valve mean 329 cm/s 235 --------- velocity, S Aortic valve VTI, S 85.2 cm 79.5 --------- Aortic mean 52 mm Hg 29 --------- gradient, S Aortic peak 77 mm Hg 41 --------- gradient, S VTI ratio, LVOT/AV 0.34 ---------- --------- Aortic valve area, 1.07 cm^2 ---------- --------- VTI Aortic valve 0.39 cm^2/m^2 ---------- --------- area/bsa, VTI Aortic valve area, 1.02 cm^2 ---------- --------- peak velocity Aortic valve 0.37 cm^2/m^2 ---------- --------- area/bsa, peak velocity  Aorta Value 03/14/2013 Reference Aortic root ID, ED 25 mm ---------- ---------  Left atrium Value 03/14/2013 Reference LA ID, A-P, ES 40 mm ---------- --------- LA ID/bsa, A-P 1.44  cm/m^2 ---------- <=2.2  Mitral valve Value 03/14/2013 Reference Mitral E-wave peak 107 cm/s ---------- --------- velocity Mitral A-wave peak 130 cm/s ---------- --------- velocity Mitral deceleration 218 ms ---------- 150 - 230 time Mitral peak 5 mm Hg ---------- --------- gradient, D Mitral E/A ratio, 0.8 ---------- --------- peak  Right ventricle Value 03/14/2013 Reference RV s&', lateral, S 22.7 cm/s ---------- ---------  Legend: (L) and (H) mark values outside specified reference range.  ------------------------------------------------------------------- Prepared and Electronically Authenticated by  Dorris Carnes, M.D. 2015-08-13T16:57:06    Cardiac Catheterization Operative Report  Hollie Salk  [id number]  9/17/20159:55 AM  Theressa Millard, MD  Procedure Performed:  1. Left Heart Catheterization 2. Selective Coronary Angiography 3. Right Heart Catheterization Operator: Lauree Chandler, MD  Indication: 53 yo WF with history of CAD, OSA, HTN, HLD, obesity, GERD, asthma, COPD, former tobacco abuse, depression here today for cardiac catheterization. I saw her for the first time in December 2012. She had a cardiac cath in February 2010 and had a drug eluting stent (2.59mm Xience) placed in the distal RCA per Dr. Melvern Banker. Her LAD and Circumflex were free of disease. She had her right knee replaced in February 8,2013. She developed SOB on 02/08/12 and was found to have a PE and pneumonia. She was started on coumadin and treated for 6 months. CTA chest on 08/09/12 showed no evidence of PE so her coumadin was stopped. I saw her February 20,2014 and she c/o severe dyspnea with minimal exertion. I felt that this was multi-factorial. Chest CTA with no PE. I arranged a stress myoview which showed possible ischemia. Echo with severe AS, normal LV function. Cardiac cath on 03/17/13 with mild non-obstructive disease and patent stent RCA. TEE with moderate AS. Most recent echo  08/10/14 with normal LV function, severe AS with mean gradient of 50 mmHg, peak gradient 77 mmHg. She has had worsening of dyspnea with exertion. She cannot walk across the room without severe dyspnea. Right and left heart cath to assess pressures, exclude progression of CAD.  Procedure Details:  The risks, benefits, complications, treatment options, and expected outcomes were discussed with the patient. The patient and/or family concurred with the proposed plan, giving informed consent. The patient was brought to the cath lab after IV hydration was begun and oral premedication was given. The patient  was further sedated with Versed and Fentanyl. Due to her morbid obesity, our initial plan was to perform right heart cath from the right antecubital vein and her left heart cath from the right radial artery. There was a IV line present in the right antecubital vein. This area was prepped and draped. I could not advance a wire through the IV catheter. I then prepped the right groin and placed a 7 French sheath in the right femoral vein. Right heart cath was performed with a balloon tipped catheter. I then prepped the right wrist. 1% lidocaine used for local anesthesia. A 5 French sheath was placed in the right radial artery. 3 mg Verapamil given through the sheath. 6000 Units IV heparin through sheath. Standard diagnostic catheters were used to perform selective coronary angiography. I was never able to fully engage the RCA due to ostial stenosis and instability of the catheter due to the high velocity jet in the aorta from her AS. I was able to cross the aortic valve with a AL-1 catheter and long straight wire. Pressures were measured. There were no immediate complications. The patient was taken to the recovery area in stable condition.  Hemodynamic Findings:  Ao: 136/80  LV: 182/15/28  RA: 6  RV: 50/10/16  PA: 42/28 (mean 35)  PCWP: 23  Fick Cardiac Output: 4.99 L/min  Fick Cardiac Index: 2.1 L/min/m2   Central Aortic Saturation: 97%  Pulmonary Artery Saturation: 59%  Aortic Valve Data: Peak to peak gradient 46 mmHg  Mean gradient 12 mmHg  Angiographic Findings:  Left main: No obstructive disease.  Left Anterior Descending Artery: Large caliber vessel that courses to the apex. There is a moderate caliber diagonal branch. No obstructive disease noted.  Circumflex Artery: Large caliber vessel with two small obtuse marginal branches and a moderate caliber posterolateral branch. No obstructive disease noted.  Right Coronary Artery: Large dominant vessel with 70-80% ostial stenosis. 40% proximal stenosis. Distal stented segment is patent with no restenosis.  Left Ventricular Angiogram: Deferred.  Impression:  1. Single vessel CAD with ostial RCA stenosis, patent distal RCA stent  2. Severe aortic valve stenosis  3. Morbid obesity  Recommendations: She has progression of her CAD with ostial RCA stenosis and severe AS. She will need AVR and bypass of RCA. I will refer her to see one of our CT surgeons. She will be high risk for traditional AVR due to size. May have to consider TAVR/PCI of RCA.  Complications: None; patient tolerated the procedure well.    Cardiac TAVR CT  TECHNIQUE:  The patient was scanned on a Philips 256 scanner. A 120 kV  retrospective scan was triggered in the descending thoracic aorta at  111 HU's. Gantry rotation speed was 270 msecs and collimation was .9  mm. 5 mg of iv Metoprolol or nitro were given. The 3D data set was  reconstructed in 5% intervals of the R-R cycle. Systolic and  diastolic phases were analyzed on a dedicated work station using  MPR, MIP and VRT modes. The patient received 80 cc of contrast.  FINDINGS:  The quality of this study is affected by patient's size (BMI = 69).  Aortic Valve: Trileaflet, moderately thickened and calcified aortic  valve with moderately reduced leaflet separation.  Aorta: Normal size of aortic root and thoracic aorta.  There is  almost circumferential calcification starting at the upper portion  of the sinuses of Valsalva including ostium of RCA, that continues  into the sinotubular junction and is extending into  proximal  portions of the ascending thoracic aorta. The distal portion of the  ascending aorta, aortic arch and descending thoracic aorta have only  mild diffuse calcifications. There is no dissection.  Sinotubular Junction: 24 x 24 mm  Ascending Thoracic Aorta: 32 x 31 mm  Aortic Arch: 25 x 25 mm  Descending Thoracic Aorta: 24 x 23 mm  Sinus of Valsalva Measurements:  Non-coronary: 28 mm  Right -coronary: 28 mm  Left -coronary: 28 mm  Coronary Artery Height above Annulus:  Left Main: 11 mm  Right Coronary: 16 mm  Virtual Basal Annulus Measurements:  Maximum/Minimum Diameter: 25 x 23 mm  Perimeter: 88 mm  Area: 470 mm2  Coronary Arteries: The study was not intended for coronary  evaluation and NTG was not used.  Coronary arteries are originating in normal position. There is right  dominance.  RCA has moderate to severe ostial calcifications associated with >  70% stenosis, there is another calcified lesion in the proximal RCA  associated with 25-50% stenosis. Stent in distal RCA appears patent.  There are mild diffuse calcifications in the ostial left main,  proximal and mid LAD and in the mid LCX associated with 0-25%  stenosis.  Optimum Fluoroscopic Angle for Delivery: RAO 1 CRA 0  Sheath to tip distance: 57 mm  Other findings: Moderate concentric left ventricular hypertrophy.  Mild biatrial dilatation. Pulmonary artery has normal size: 29 x 27  mm.  IMPRESSION:  1. Trileaflet, moderately thickened and calcified aortic valve with  moderately reduced leaflet separation and annular measurements  suitable for delivery of 26 mm Edward-SAPIEN XT valve.  2. Severe circumferential calcifications of the upper portions of  sinuses of Valsalva including ostium of RCA, continuing into the   sinotubular junction and extending into proximal portions of the  ascending thoracic aorta. Normal size of the aortic root and all  portions of the thoracic aorta.  3. Optimum Fluoroscopic Angle for Delivery: RAO 1 CRA 0.  Ena Dawley  Electronically Signed  By: Ena Dawley  On: 10/13/2014 13:09       Study Result    EXAM:  OVER-READ INTERPRETATION CT CHEST  The following report is an over-read performed by radiologist Dr.  Sharion Dove [facility] on 10/12/2014. This  over-read does not include interpretation of cardiac or coronary  anatomy or pathology. The coronary calcium score/coronary CTA  interpretation by the cardiologist is attached.  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 N unusual recess  of the pericardium is also possible. MRI may be helpful for further  evaluation an characterization given the fact that it has continued  to enlarged since 2013.  The lungs demonstrate a mosaic pattern of ground-glass attenuation.  This is most typically seen with reactive airways disease such as  asthma. Other possibilities would include respiratory bronchiolitis  and cryptogenic organizing pneumonia. Patchy areas of atelectasis  are noted. No worrisome pulmonary mass lesions. Small nodules are  noted. There is a 5 mm nodule in the right upper lobe on image  number 20, a small nodule in the right lower lobe on image number 46  and adjacent smaller clustered nodules which are more likely part of  a tree-in-bud appearance.  No pleural effusion or pulmonary edema. Lingular and right middle  lobe atelectasis is noted. No bronchiectasis.  The visualized upper abdomen is grossly normal.  IMPRESSION:  1.  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 MR  of the mediastinum without and with contrast may be helpful for   further evaluation given the fact that it continues to enlarge.  2. Small pulmonary nodules are likely inflammatory. Recommend repeat  noncontrast chest CT in 6 months.  3. Mosaic pattern of ground-glass attenuation most likely due to  reactive airways disease.  Electronically Signed:  By: Kalman Jewels M.D.  On: 10/12/2014 15:09      CTA ABDOMEN AND PELVIS WITH CONTRAST  TECHNIQUE:  Multidetector CT imaging of the abdomen and pelvis was performed  using the standard protocol during bolus administration of  intravenous contrast. Multiplanar reconstructed images and MIPs were  obtained and reviewed to evaluate the vascular anatomy.  CONTRAST: 24mL OMNIPAQUE IOHEXOL 350 MG/ML SOLN  COMPARISON: CT of the abdomen and pelvis 09/21/2013.  FINDINGS:  CT ABDOMEN AND PELVIS FINDINGS  Lower chest: Minimal scarring or subsegmental atelectasis in lung  bases bilaterally.  Hepatobiliary: No focal hepatic lesion. No intra or extrahepatic  biliary ductal dilatation. Status post cholecystectomy.  Pancreas: Unremarkable.  Spleen: Spleen is enlarged measuring 4.7 x 14.6 x 12.5 cm (estimated  splenic volume of 429 mL).  Adrenals/Urinary Tract: Normal adrenal glands bilaterally. Sub cm  low-attenuation lesions in the kidneys bilaterally are too small to  definitively characterize, but favored to represent tiny cysts. No  hydroureteronephrosis or perinephric stranding to indicate urinary  tract obstruction at this time. Urinary bladder is unremarkable in  appearance.  Stomach/Bowel: The appearance of the stomach is normal. No  pathologic dilatation of the small bowel or colon.  Vascular/Lymphatic: Atherosclerosis throughout the abdominal and  pelvic vasculature, with findings and measurements pertinent to  potential TAVR procedure, as detailed below. No pathologically  enlarged lymph nodes are noted in the abdomen or pelvis.  Reproductive: Uterus and ovaries are unremarkable in appearance.   Other: No significant volume of ascites. No pneumoperitoneum.  Postoperative changes of mesh repair for ventral hernia. Notably,  and in the epigastric region there is a small recurrent ventral  hernia containing only omental fat. Additionally, there are 2 small  ventral hernias below the umbilicus, both of which contain only  omental fat.  Musculoskeletal: There are no aggressive appearing lytic or blastic  lesions noted in the visualized portions of the skeleton.  VASCULAR MEASUREMENTS PERTINENT TO TAVR:  AORTA:  Minimal Aortic Diameter - 10 x 14 mm mm  Severity of Aortic Calcification - moderate  RIGHT PELVIS:  Right Common Iliac Artery -  Minimal Diameter - 9.5 x 7.6 mm  Tortuosity - mild  Calcification - mild  Right External Iliac Artery -  Minimal Diameter - 7.5 x 5.9 mm  Tortuosity - mild  Calcification - none  Right Common Femoral Artery -  Minimal Diameter - 8.3 x 6.3 mm  Tortuosity - mild  Calcification - mild  LEFT PELVIS:  Left Common Iliac Artery -  Minimal Diameter - 6.4 x 8.6 mm  Tortuosity - mild  Calcification - mild  Left External Iliac Artery -  Minimal Diameter - 7.2 x 7.3 mm  Tortuosity - mild  Calcification - none  Left Common Femoral Artery -  Minimal Diameter - 8.1 x 5.6 mm  Tortuosity - mild  Calcification - mild  Review of the MIP images confirms the above findings.  IMPRESSION:  1. Findings and measurements pertinent to potential TAVR procedure,  as detailed above. This patient does appear to have suitable pelvic  arterial access.  2. Status  post mesh repair for ventral hernia in the upper abdomen,  with multiple ventral hernias in the epigastric region and low  anterior abdominal wall, as detailed above, each of which contains  only omental fat.  3. Splenomegaly.  4. Status post cholecystectomy.  5. Additional incidental findings, as above.  Electronically Signed  By: Vinnie Langton M.D.  On: 10/12/2014 15:56     Pulmonary  Function Tests  Baseline      Post-bronchodilator  FVC  1.63 L  (50% predicted) FVC  1.73 L  (54% predicted) FEV1  1.01 L  (40% predicted) FEV1  1.09 L  (43% predicted) FEF25-75 0.46 L  (18% predicted) FEF25-75 0.61 L  (25% predicted)  TLC  5.62 L  (118% predicted) RV  3.99 L  (222% predicted) DLCO  55% predicted   STS Risk Calculator  Procedure    AVR  Risk of Mortality   5.8% Morbidity or Mortality  29.6% Prolonged LOS   14.7% Short LOS    18.8% Permanent Stroke   0.6% Prolonged Vent Support  23.3% DSW Infection    0.7% Renal Failure    10.0% Reoperation    8.0%   Impression:  The patient has likely stage D severe symptomatic aortic stenosis with preserved left ventricular systolic function. She presents with long-standing history of chronic shortness of breath that is unquestionably multifactorial and consistent with a combination of both chronic diastolic congestive heart failure and severe chronic lung disease related to morbid obesity, OSA, and severe COPD with asthma.  Symptoms have progressed recently and follow-up transthoracic echocardiogram suggests the possibility of progression of the severity of aortic stenosis.  The patient has not been smoking for more than 10 years, she apparently has not gained any significant amount of weight above her baseline over the past year or so, and there are no other reasons to explain progression of symptoms.  I personally reviewed the patient's last few echocardiograms and diagnostic cardiac catheterization films. Sound transmission on transthoracic echocardiograms is poor because of the patient's body habitus. The patient clearly has aortic stenosis, although the aortic valve leaflets are only mildly thickened and difficult to visualize. Peak philosophy across the aortic valve has gone up significantly, that the patient has relatively small size aortic root and the peak velocity across the left ventricular outflow tract is elevated as  well. Review of the transesophageal echocardiogram performed in 2014 is notable for less impressive aortic stenosis with reasonable leaflet mobility. Moreover, transvalvular gradient measured at the time of recent catheterization is somewhat difficult to interpret with the peak to peak gradient measured 40 mmHg and mean transvalvular gradient only 12 mmHg.  It might be reasonable to consider repeating a transesophageal echocardiogram to further clarify the severity of aortic stenosis, ascertain whether or not aortic valve replacement seems appropriate at this time, and better characterize the potential benefits associated with valve replacement.  I would agree that the patient would be very high risk for conventional surgical aortic valve replacement with anticipated risk of mortality approaching 15% and risk of morbidity well above 50%.  Conventional surgical aortic valve replacement would likely require aortic root replacement because of the relatively small size of the patient's aortic root and the presence of significant calcification which might preclude root enlargement. Under the circumstances transcatheter aortic valve replacement might be an attractive treatment alternative, particular given the fact that TAVR would likely be associated with an optimal hemodynamic result and minimal residual gradient.  However, the patient is relatively young and  the long-term durability of TAVR in patients her age remains unclear.  Nevertheless, unless the patient were to find a way to lose a great deal of weight her life expectancy is probably not too long.   Plan:  The patient was counseled at length regarding treatment alternatives for management of severe symptomatic aortic stenosis. Alternative approaches such as conventional aortic valve or aortic root replacement, transcatheter aortic valve replacement, and palliative medical therapy were compared and contrasted at length.  The risks associated with  conventional surgical aortic valve replacement were been discussed in detail, as were expectations for post-operative convalescence. Long-term prognosis with medical therapy was discussed. This discussion was placed in the context of the patient's own specific clinical presentation and past medical history.  We discussed the possibility of proceeding with repeat transesophageal echocardiogram to further characterize the severity of aortic stenosis and whether or not it seems appropriate to proceed with valve replacement at this time. We will also refer her to the morbid obesity clinic to consider preliminary evaluation for possible weight loss surgery in the future after her cardiac issues have been resolved.  All of her questions been addressed.      I spent in excess of 90 minutes during the conduct of this office consultation and >50% of this time involved direct face-to-face encounter with the patient for counseling and/or coordination of their care.   Valentina Gu. Roxy Manns, MD 10/26/2014 11:14 AM

## 2014-11-14 NOTE — Discharge Instructions (Signed)
Transesophageal Echocardiogram °Transesophageal echocardiography (TEE) is a picture test of your heart using sound waves. The pictures taken can give very detailed pictures of your heart. This can help your doctor see if there are problems with your heart. TEE can check: °· If your heart has blood clots in it. °· How well your heart valves are working. °· If you have an infection on the inside of your heart. °· Some of the major arteries of your heart. °· If your heart valve is working after a repair. °· Your heart before a procedure that uses a shock to your heart to get the rhythm back to normal. °BEFORE THE PROCEDURE °· Do not eat or drink for 6 hours before the procedure or as told by your doctor. °· Make plans to have someone drive you home after the procedure. Do not drive yourself home. °· An IV tube will be put in your arm. °PROCEDURE °· You will be given a medicine to help you relax (sedative). It will be given through the IV tube. °· A numbing medicine will be sprayed or gargled in the back of your throat to help numb it. °· The tip of the probe is placed into the back of your mouth. You will be asked to swallow. This helps to pass the probe into your esophagus. °· Once the tip of the probe is in the right place, your doctor can take pictures of your heart. °· You may feel pressure at the back of your throat. °AFTER THE PROCEDURE °· You will be taken to a recovery area so the sedative can wear off. °· Your throat may be sore and scratchy. This will go away slowly over time. °· You will go home when you are fully awake and able to swallow liquids. °· You should have someone stay with you for the next 24 hours. °· Do not drive or operate machinery for the next 24 hours. °Document Released: 10/12/2009 Document Revised: 12/20/2013 Document Reviewed: 06/16/2013 °Exit[employer]® Patient Information ©2015 [employer]. This information is not intended to replace advice given to you by your health care provider. Make  sure you discuss any questions you have with your health care provider. ° ° °Conscious Sedation, Adult, Care After °Refer to this sheet in the next few weeks. These instructions provide you with information on caring for yourself after your procedure. Your health care provider may also give you more specific instructions. Your treatment has been planned according to current medical practices, but problems sometimes occur. Call your health care provider if you have any problems or questions after your procedure. °WHAT TO EXPECT AFTER THE PROCEDURE  °After your procedure: °· You may feel sleepy, clumsy, and have poor balance for several hours. °· Vomiting may occur if you eat too soon after the procedure. °HOME CARE INSTRUCTIONS °· Do not participate in any activities where you could become injured for at least 24 hours. Do not: °¨ Drive. °¨ Swim. °¨ Ride a bicycle. °¨ Operate heavy machinery. °¨ Cook. °¨ Use power tools. °¨ Climb ladders. °¨ Work from a high place. °· Do not make important decisions or sign legal documents until you are improved. °· If you vomit, drink water, juice, or soup when you can drink without vomiting. Make sure you have little or no nausea before eating solid foods. °· Only take over-the-counter or prescription medicines for pain, discomfort, or fever as directed by your health care provider. °· Make sure you and your family fully understand everything about   the medicines given to you, including what side effects may occur. °· You should not drink alcohol, take sleeping pills, or take medicines that cause drowsiness for at least 24 hours. °· If you smoke, do not smoke without supervision. °· If you are feeling better, you may resume normal activities 24 hours after you were sedated. °· Keep all appointments with your health care provider. °SEEK MEDICAL CARE IF: °· Your skin is pale or bluish in color. °· You continue to feel nauseous or vomit. °· Your pain is getting worse and is not helped  by medicine. °· You have bleeding or swelling. °· You are still sleepy or feeling clumsy after 24 hours. °SEEK IMMEDIATE MEDICAL CARE IF: °· You develop a rash. °· You have difficulty breathing. °· You develop any type of allergic problem. °· You have a fever. °MAKE SURE YOU: °· Understand these instructions. °· Will watch your condition. °· Will get help right away if you are not doing well or get worse. °Document Released: 10/05/2013 Document Reviewed: 10/05/2013 °Exit[employer]® Patient Information ©2015 [employer]. This information is not intended to replace advice given to you by your health care provider. Make sure you discuss any questions you have with your health care provider. ° °

## 2014-11-15 ENCOUNTER — Other Ambulatory Visit: Payer: Self-pay | Admitting: *Deleted

## 2014-11-15 ENCOUNTER — Ambulatory Visit (INDEPENDENT_AMBULATORY_CARE_PROVIDER_SITE_OTHER): Payer: Medicaid Other | Admitting: Surgery

## 2014-11-15 ENCOUNTER — Encounter (HOSPITAL_COMMUNITY): Payer: Self-pay | Admitting: Cardiology

## 2014-11-15 VITALS — BP 137/76 | HR 75 | Resp 20 | Ht 60.0 in | Wt 350.0 lb

## 2014-11-15 DIAGNOSIS — I350 Nonrheumatic aortic (valve) stenosis: Secondary | ICD-10-CM

## 2014-11-15 DIAGNOSIS — I2510 Atherosclerotic heart disease of native coronary artery without angina pectoris: Secondary | ICD-10-CM

## 2014-11-15 DIAGNOSIS — I5032 Chronic diastolic (congestive) heart failure: Secondary | ICD-10-CM

## 2014-11-15 DIAGNOSIS — Z6841 Body Mass Index (BMI) 40.0 and over, adult: Secondary | ICD-10-CM

## 2014-11-20 ENCOUNTER — Encounter: Payer: Self-pay | Admitting: Surgery

## 2014-11-20 NOTE — Progress Notes (Signed)
HPI:  The patient returns today for follow up of severe aortic stenosis following TEE to further evaluate the aortic valve and the degree of AS. This study was reviewed personally with Dr. 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 Her symptoms of exertional dyspnea are unchanged.  Current Outpatient Prescriptions  Medication Sig Dispense Refill  . albuterol (PROVENTIL HFA;VENTOLIN HFA) 108 (90 BASE) MCG/ACT inhaler Inhale 2 puffs into the lungs every 4 (four) hours as needed for wheezing or shortness of breath.     Marland Kitchen albuterol (PROVENTIL) (2.5 MG/3ML) 0.083% nebulizer solution Take 2.5 mg by nebulization every 4 (four) hours as needed for wheezing or shortness of breath.     Marland Kitchen aspirin EC 325 MG tablet Take 325 mg by mouth 2 (two) times daily.    Marland Kitchen atorvastatin (LIPITOR) 80 MG tablet Take 80 mg by mouth daily.    . budesonide-formoterol (SYMBICORT) 160-4.5 MCG/ACT inhaler Inhale 2 puffs into the lungs 2 (two) times daily.    . citalopram (CELEXA) 20 MG tablet Take 20 mg by mouth daily.    . clopidogrel (PLAVIX) 75 MG tablet Take 75 mg by mouth every morning.     . ezetimibe (ZETIA) 10 MG tablet Take 10 mg by mouth daily.    . furosemide (LASIX) 40 MG tablet Take 40 mg by mouth 2 (two) times daily.    Marland Kitchen gabapentin (NEURONTIN) 300 MG capsule Take 300 mg by mouth 2 (two) times daily.     Marland Kitchen glipiZIDE (GLUCOTROL) 5 MG tablet Take 5 mg by mouth daily before supper.     . losartan (COZAAR) 100 MG tablet Take 100 mg by mouth daily with breakfast.     . MAGNESIUM PO Take 1-2 tablets by mouth daily as needed (constipation).     . Multiple Vitamin (MULTIVITAMIN) tablet Take 1 tablet by mouth daily.    . OXYGEN-HELIUM IN Inhale 3 L into the lungs daily.     . potassium chloride SA (K-DUR,KLOR-CON) 20 MEQ tablet Take 40 mEq by mouth daily.     No current facility-administered medications for this visit.     Physical Exam: BP 137/76 mmHg   Pulse 75  Resp 20  Ht 5' (1.524 m)  Wt 350 lb (158.759 kg)  BMI 68.35 kg/m2  SpO2 97%  LMP 07/26/2009 She looks chronically ill but in no distress. Lungs: clear to auscultation, symmetrical breath sounds, no wheezes, no rhonchi  Heart: RRR, grade III/VI crescendo/decrescendo murmur heard best at RSB, no diastolic murmur Ext: warm, well-perfused, pulses not palpable, moderate LE edema   Diagnostic Tests:                *[place]*         *[facility]*            [address], [place] [address]              [phone]  ------------------------------------------------------------------- Transesophageal Echocardiography  Patient:  [name], Punches MR #:    [id number] Study Date: 11/14/2014 Gender:   F Age:    53 Height:   152.4 cm Weight:   147.7 kg BSA:    2.61 m^2 Pt. Status: Room:  ORDERING   Fransico Him, MD REFERRING  Fransico Him, MD PERFORMING  Kirk Ruths SONOGRAPHER Jimmy Reel, RDCS ADMITTING  Ena Dawley, M.D. ATTENDING  Ena Dawley,  M.D.  cc:  ------------------------------------------------------------------- LV EF: 55% -  60%  ------------------------------------------------------------------- Indications:   Aortic stenosis 424.1.  ------------------------------------------------------------------- Study Conclusions  - Left ventricle: Systolic function was normal. The estimated ejection fraction was in the range of 55% to 60%. Wall motion was normal; there were no regional wall motion abnormalities. - Aortic valve: Functionally bicuspid. There was severe stenosis. There was mild regurgitation. - Mitral valve: Mildly calcified annulus. No evidence of vegetation. There was mild regurgitation. - Left atrium: The atrium was moderately dilated. No evidence of thrombus in the atrial  cavity or appendage. - Atrial septum: No defect or patent foramen ovale was identified. - Tricuspid valve: No evidence of vegetation. - Pulmonic valve: No evidence of vegetation.  Impressions:  - Normal LV function; functionally bicuspid aortic valve (fusion of noncoronary and left cusps); severe AS (AVA 0.9 cm2 by planimetry; unable to obtain gradients with transgastric views); mild AI; mild MR and TR.  Diagnostic transesophageal echocardiography. 2D and color Doppler. Birthdate: Patient birthdate: [birth year]. Age: Patient is 53 yr old. Sex: Gender: female.  BMI: 63.6 kg/m^2. Blood pressure: 154/76 Patient status: Outpatient. Study date: Study date: 11/14/2014. Study time: 09:35 AM. Location: Endoscopy.  -------------------------------------------------------------------  ------------------------------------------------------------------- Left ventricle: Systolic function was normal. The estimated ejection fraction was in the range of 55% to 60%. Wall motion was normal; there were no regional wall motion abnormalities.  ------------------------------------------------------------------- Aortic valve:  Functionally bicuspid. Doppler:  There was severe stenosis.  There was mild regurgitation.  ------------------------------------------------------------------- Aorta: Descending aorta: The descending aorta had moderate diffuse disease.  ------------------------------------------------------------------- Mitral valve:  Mildly calcified annulus. Leaflet separation was normal. No evidence of vegetation. Doppler: There was mild regurgitation.  ------------------------------------------------------------------- Left atrium: The atrium was moderately dilated. No evidence of thrombus in the atrial cavity or appendage.  ------------------------------------------------------------------- Atrial septum: No defect or patent foramen ovale was  identified.  ------------------------------------------------------------------- Right ventricle: The cavity size was normal. Systolic function was normal.  ------------------------------------------------------------------- Pulmonic valve:  Structurally normal valve.  Cusp separation was normal. No evidence of vegetation.  ------------------------------------------------------------------- Tricuspid valve:  Structurally normal valve.  Leaflet separation was normal. No evidence of vegetation. Doppler: There was mild regurgitation.  ------------------------------------------------------------------- Right atrium: The atrium was normal in size.  ------------------------------------------------------------------- Pericardium: There was no pericardial effusion.  ------------------------------------------------------------------- Measurements  LVOT     Value LVOT ID, S  21  mm LVOT area   3.46 cm^2  Legend: (L) and (H) mark values outside specified reference range.  ------------------------------------------------------------------- Prepared and Electronically Authenticated by  Kirk Ruths 2015-11-17T16:40:55   Impression:  I think she clearly has severe, symptomatic aortic stenosis with a relatively small annulus and has recently progressive shortness of breath with minimal activity. Her CHF is complicated by morbid obesity, COPD and OSA, and severe obstructive and restrictive lung disease. Gated cardiac CT shows annular measurements suitable for a 26 mm Sapient XT valve. There is severe circumferential calcification of the upper portions of the sinuses of Valsalva continuing into the sinotubular junction and proximal ascending aorta. The abdominal and pelvic CT show suitable pelvic arterial access. Her PFT's show a severe obstructive defect and evidence of a moderate restrictive defect as well as a moderate reduction in diffusion capacity. I  think TAVR would be the best option for her and her recent CT scans show that she would be a candidate for a 26 mm Sapient XT valve via a transfemoral route. 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 Angelena Form estimated at 70%. We have discussed  reviewed her cath films and discussed this and feel that this can be observed for now.  During the course of the patient's preoperative work up they have been evaluated comprehensively by a multidisciplinary team of specialists coordinated through the [facility] in the [facility].  They have been demonstrated to suffer from symptomatic severe aortic stenosis as noted above. The patient has been counseled extensively as to the relative risks and benefits of all options for the treatment of severe aortic stenosis including long term medical therapy, conventional surgery for aortic valve replacement, and transcatheter aortic valve replacement.  The patient has been independently evaluated by two cardiac surgeons including myself and Dr. Roxy Manns, and  they are felt to be at high risk for conventional surgical aortic valve replacement based upon a predicted risk of mortality using the Society of Thoracic Surgeons risk calculator of 5.3% and both of [place] feel that the patient would be a poor candidate for conventional surgery (predicted risk of mortality >15% and/or predicted risk of permanent morbidity >50%) because of comorbidities including morbid obesity, oxygen-dependent severe COPD and restrictive lung disease and OSA. She also has a relatively small annulus and would require an aortic root replacement to place an adequate sized valve with conventional open surgery.  Based upon review of all of the patient's preoperative diagnostic tests they are felt to be candidate for transcatheter aortic valve replacement using the transfemoral approach as an alternative to high risk conventional surgery.    Following the decision  to proceed with transcatheter aortic valve replacement, a discussion has been held regarding what types of management strategies would be attempted intraoperatively in the event of life-threatening complications, including whether or not the patient would be considered a candidate for the use of cardiopulmonary bypass and/or conversion to open sternotomy for attempted surgical intervention.  The patient has been advised of a variety of complications that might develop including but not limited to risks of death, stroke, paravalvular leak, aortic dissection or other major vascular complications, aortic annulus rupture, device embolization, cardiac rupture or perforation, mitral regurgitation, acute myocardial infarction, arrhythmia, heart block or bradycardia requiring permanent pacemaker placement, congestive heart failure, respiratory failure, renal failure, pneumonia, infection, other late complications related to structural valve deterioration or migration, or other complications that might ultimately cause a temporary or permanent loss of functional independence or other long term morbidity.  The patient provides full informed consent for the procedure as described and all questions were answered.   Plan:  Transcatheter aortic valve replacement using a 26 mm Sapien XT or Sapien 3 valve via a transfemoral route.

## 2014-11-26 ENCOUNTER — Encounter (HOSPITAL_COMMUNITY): Payer: Self-pay | Admitting: *Deleted

## 2014-11-26 ENCOUNTER — Emergency Department (HOSPITAL_COMMUNITY)
Admission: EM | Admit: 2014-11-26 | Discharge: 2014-11-26 | Disposition: A | Discharge status: Home / Self Care | Payer: Medicaid Other | Attending: Emergency Medicine | Admitting: Emergency Medicine

## 2014-11-26 DIAGNOSIS — E119 Type 2 diabetes mellitus without complications: Secondary | ICD-10-CM | POA: Diagnosis not present

## 2014-11-26 DIAGNOSIS — Z872 Personal history of diseases of the skin and subcutaneous tissue: Secondary | ICD-10-CM | POA: Insufficient documentation

## 2014-11-26 DIAGNOSIS — Y998 Other external cause status: Secondary | ICD-10-CM | POA: Diagnosis not present

## 2014-11-26 DIAGNOSIS — Z8701 Personal history of pneumonia (recurrent): Secondary | ICD-10-CM | POA: Insufficient documentation

## 2014-11-26 DIAGNOSIS — F329 Major depressive disorder, single episode, unspecified: Secondary | ICD-10-CM | POA: Insufficient documentation

## 2014-11-26 DIAGNOSIS — Y9289 Other specified places as the place of occurrence of the external cause: Secondary | ICD-10-CM | POA: Diagnosis not present

## 2014-11-26 DIAGNOSIS — I10 Essential (primary) hypertension: Secondary | ICD-10-CM | POA: Insufficient documentation

## 2014-11-26 DIAGNOSIS — Z87891 Personal history of nicotine dependence: Secondary | ICD-10-CM | POA: Insufficient documentation

## 2014-11-26 DIAGNOSIS — R011 Cardiac murmur, unspecified: Secondary | ICD-10-CM | POA: Insufficient documentation

## 2014-11-26 DIAGNOSIS — J449 Chronic obstructive pulmonary disease, unspecified: Secondary | ICD-10-CM | POA: Insufficient documentation

## 2014-11-26 DIAGNOSIS — I2510 Atherosclerotic heart disease of native coronary artery without angina pectoris: Secondary | ICD-10-CM | POA: Diagnosis not present

## 2014-11-26 DIAGNOSIS — Z86711 Personal history of pulmonary embolism: Secondary | ICD-10-CM | POA: Diagnosis not present

## 2014-11-26 DIAGNOSIS — W57XXXA Bitten or stung by nonvenomous insect and other nonvenomous arthropods, initial encounter: Secondary | ICD-10-CM

## 2014-11-26 DIAGNOSIS — Z7982 Long term (current) use of aspirin: Secondary | ICD-10-CM | POA: Insufficient documentation

## 2014-11-26 DIAGNOSIS — I252 Old myocardial infarction: Secondary | ICD-10-CM | POA: Diagnosis not present

## 2014-11-26 DIAGNOSIS — S30860A Insect bite (nonvenomous) of lower back and pelvis, initial encounter: Secondary | ICD-10-CM | POA: Diagnosis present

## 2014-11-26 DIAGNOSIS — R110 Nausea: Secondary | ICD-10-CM | POA: Insufficient documentation

## 2014-11-26 DIAGNOSIS — E785 Hyperlipidemia, unspecified: Secondary | ICD-10-CM | POA: Diagnosis not present

## 2014-11-26 DIAGNOSIS — Z7951 Long term (current) use of inhaled steroids: Secondary | ICD-10-CM | POA: Diagnosis not present

## 2014-11-26 DIAGNOSIS — Y939 Activity, unspecified: Secondary | ICD-10-CM | POA: Insufficient documentation

## 2014-11-26 DIAGNOSIS — I5032 Chronic diastolic (congestive) heart failure: Secondary | ICD-10-CM | POA: Diagnosis not present

## 2014-11-26 DIAGNOSIS — Z8669 Personal history of other diseases of the nervous system and sense organs: Secondary | ICD-10-CM | POA: Diagnosis not present

## 2014-11-26 DIAGNOSIS — Z87442 Personal history of urinary calculi: Secondary | ICD-10-CM | POA: Insufficient documentation

## 2014-11-26 MED ORDER — DOXYCYCLINE HYCLATE 100 MG PO CAPS: 100.0000 mg | ORAL_CAPSULE | Freq: Two times a day (BID) | ORAL | Status: DC

## 2014-11-26 NOTE — Discharge Instructions (Signed)
Possible Lyme Disease You may have been bitten by a tick and are to watch for the development of Lyme Disease. Lyme Disease is an infection that is caused by a bacteria The bacteria causing this disease is named Borreilia burgdorferi. If a tick is infected with this bacteria and then bites you, then Lyme Disease may occur. These ticks are carried by deer and rodents such as rabbits and mice and infest grassy as well as forested areas. Fortunately most tick bites do not cause Lyme Disease.  Lyme Disease is easier to prevent than to treat. First, covering your legs with clothing when walking in areas where ticks are possibly abundant will prevent their attachment because ticks tend to stay within inches of the ground. Second, using insecticides containing DEET can be applied on skin or clothing. Last, because it takes about 12 to 24 hours for the tick to transmit the disease after attachment to the human host, you should inspect your body for ticks twice a day when you are in areas where Lyme Disease is common. You must look thoroughly when searching for ticks. The Ixodes tick that carries Lyme Disease is very small. It is around the size of a sesame seed (picture of tick is not actual size). Removal is best done by grasping the tick by the head and pulling it out. Do not to squeeze the body of the tick. This could inject the infecting bacteria into the bite site. Wash the area of the bite with an antiseptic solution after removal.  Lyme Disease is a disease that may affect many body systems. Because of the small size of the biting tick, most people do not notice being bitten. The first sign of an infection is usually a round red rash that extends out from the center of the tick bite. The center of the lesion may be blood colored (hemorrhagic) or have tiny blisters (vesicular). Most lesions have bright red outer borders and partial central clearing. This rash may extend out many inches in diameter, and multiple  lesions may be present. Other symptoms such as fatigue, headaches, chills and fever, general achiness and swelling of lymph glands may also occur. If this first stage of the disease is left untreated, these symptoms may gradually resolve by themselves, or progressive symptoms may occur because of spread of infection to other areas of the body.  Follow up with your caregiver to have testing and treatment if you have a tick bite and you develop any of the above complaints. Your caregiver may recommend preventative (prophylactic) medications which kill bacteria (antibiotics). Once a diagnosis of Lyme Disease is made, antibiotic treatment is highly likely to cure the disease. Effective treatment of late stage Lyme Disease may require longer courses of antibiotic therapy.  MAKE SURE YOU:   Understand these instructions.  Will watch your condition.  Will get help right away if you are not doing well or get worse. Document Released: 03/23/2001 Document Revised: 03/08/2012 Document Reviewed: 05/25/2009 [facility] Patient Information 2015 [place], [place]. This information is not intended to replace advice given to you by your health care provider. Make sure you discuss any questions you have with your health care provider.

## 2014-11-26 NOTE — ED Notes (Signed)
Pt reports having tick bite to right side x 3 days, has bulleyes red area noted, reports some nausea but no other complaints and no distress noted.

## 2014-11-26 NOTE — ED Provider Notes (Signed)
CSN: [id number]     Arrival date & time 11/26/14  1026 History   First MD Initiated Contact with Patient 11/26/14 1115     Chief Complaint  Patient presents with  . Insect Bite  . Nausea     (Consider location/radiation/quality/duration/timing/severity/associated sxs/prior Treatment) HPI Comments: Patient presents over concern about a rash at the site of the tick bite. Patient reports that she was bitten 3 days ago. Initially the area had a very small red spot. She reports that the area has expanded and when she looked online the rash looks similar to the rash with Lyme disease. She has had some mild nausea, no vomiting. She has not had any other flu symptoms, denies fever, chills, sweats, arthralgias, cough, shortness of breath, chest pain.   Past Medical History  Diagnosis Date  . Asthma   . Hypertension   . Hyperlipidemia   . GERD (gastroesophageal reflux disease)   . Depression   . Blood transfusion     2013[facility]  . Headache(784.0)     h/o migraines  . Pulmonary embolism 02/08/12    S/P RT TOTAL KNEE ON 02/03/12--ON 02/08/12--DEVELOPED ACUTE SOB AND CHEST PAIN--AND DIAGNOSED WITH  PULMONARY EMBOLUS AND PNEUMONIA  . Restless leg syndrome   . Myocardial infarction     PT THINKS SHE WAS DX WITH MI AT THE TIME OF HEART STENTING  . CAD (coronary artery disease)     Cath 2010 with DES x 1 RCA-- PT'S CARDIOLOGIST IS DR. Wade Hampton  . Uterine fibroid     NO PROBLEMS AT PRESENT FROM THE FIBROIDS-STATES SHE IS POST MENOPAUSAL-LAST MENSES 2010 EXCEPT FOR EPISODE THIS YR OF BLEEDING RELATED TO FIBROIDS.  Marland Kitchen Heart murmur   . Pneumonia 02/08/12    HOSPITALIZED AT [facility] WITH PNEUMONIA AND WITH PULMONARY EMBOLUS  . Diabetes mellitus DIAGNOSED IN2010    ORAL MEDS  . Arthritis     PAIN AND SEVERE OA LEFT KNEE ; S/P RIGHT TKA ON 02/03/12; HAS LOWER BACK PAIN-UNABLE TO STAND MORE THAN 10 MIN; ARTHRITIS "ALL OVER"  . Neck pain   . Weakness     BOTH HANDS - S/P BILATERAL CARPAL TUNNEL RELEASE--BUT  STILL HAS WEAKNESS--OFTEN DROPS THINGS  . Shortness of breath     with exertion  . Eczema     on back  . Kidney stones   . Anemia   . COPD (chronic obstructive pulmonary disease)   . Sleep apnea     uses 3 liters O2 at night   . Aortic valve stenosis, severe   . Aortic stenosis   . Morbid obesity with body mass index of 60.0-69.9 in adult   . Chronic diastolic congestive heart failure    Past Surgical History  Procedure Laterality Date  . Tubal ligation    . Carpal tunnel release      Bilateral  . Cholecystectomy    . Hernia repair    . Cardiac catheterization    . Knee arthroplasty  02/03/2012    Procedure: COMPUTER ASSISTED TOTAL KNEE ARTHROPLASTY;  Surgeon: Mcarthur Rossetti, MD;  Location: [place];  Service: Orthopedics;  Laterality: Right;  Right total knee arthroplasty  . Coronary angioplasty      2010 has stent in place  . Total knee arthroplasty  09/10/2012    Procedure: TOTAL KNEE ARTHROPLASTY;  Surgeon: Mcarthur Rossetti, MD;  Location: [facility];  Service: Orthopedics;  Laterality: Left;  . Trigger finger release  09/10/2012    Procedure: RELEASE TRIGGER  FINGER/A-1 PULLEY;  Surgeon: Mcarthur Rossetti, MD;  Location: [facility];  Service: Orthopedics;  Laterality: Right;  Right Ring Finger  . Tee without cardioversion N/A 03/14/2013    Procedure: TRANSESOPHAGEAL ECHOCARDIOGRAM (TEE);  Surgeon: Lelon Perla, MD;  Location: [facility];  Service: Cardiovascular;  Laterality: N/A;  . Joint replacement      bil total knees  . Total knee revision Right 07/15/2013    Procedure: REVISION ARTHROPLASTY RIGHT KNEE;  Surgeon: Mcarthur Rossetti, MD;  Location: [facility];  Service: Orthopedics;  Laterality: Right;  . Cystoscopy w/ retrogrades Right 09/21/2013    Procedure: CYSTOSCOPY WITH RIGHT RETROGRADE PYELOGRAM RIGHT DOUBLE J STENT ;  Surgeon: Fredricka Bonine, MD;  Location: [facility];  Service: Urology;  Laterality: Right;  . Cystoscopy with ureteroscopy and stent  placement Right 10/25/2013    Procedure: CYSTOSCOPY RIGHT URETEROSCOPY HOLMIUM LASER LITHO AND STENT PLACEMENT;  Surgeon: Fredricka Bonine, MD;  Location: [facility];  Service: Urology;  Laterality: Right;  . Tee without cardioversion N/A 11/14/2014    Procedure: TRANSESOPHAGEAL ECHOCARDIOGRAM (TEE);  Surgeon: Lelon Perla, MD;  Location: [facility] ENDOSCOPY;  Service: Cardiovascular;  Laterality: N/A;   Family History  Problem Relation Age of Onset  . Breast cancer Mother   . COPD Father     smoked  . Cancer Brother     Sinus  . Emphysema Mother     smoked  . Asthma Father   . Heart disease Mother   . Heart disease Father    History  Substance Use Topics  . Smoking status: Former Smoker -- 1.50 packs/day for 30 years    Types: Cigarettes    Quit date: 12/29/2000  . Smokeless tobacco: Never Used  . Alcohol Use: No   OB History    No data available     Review of Systems  Skin: Positive for rash.  All other systems reviewed and are negative.     Allergies  Review of patient's allergies indicates no known allergies.  Home Medications   Prior to Admission medications   Medication Sig Start Date End Date Taking? Authorizing Provider  albuterol (PROVENTIL HFA;VENTOLIN HFA) 108 (90 BASE) MCG/ACT inhaler Inhale 2 puffs into the lungs every 4 (four) hours as needed for wheezing or shortness of breath.    Yes Historical Provider, MD  albuterol (PROVENTIL) (2.5 MG/3ML) 0.083% nebulizer solution Take 2.5 mg by nebulization every 4 (four) hours as needed for wheezing or shortness of breath.    Yes Historical Provider, MD  aspirin EC 325 MG tablet Take 325 mg by mouth 2 (two) times daily. 07/18/13  Yes Erskine Emery, PA-C  atorvastatin (LIPITOR) 80 MG tablet Take 80 mg by mouth daily.   Yes Historical Provider, MD  budesonide-formoterol (SYMBICORT) 160-4.5 MCG/ACT inhaler Inhale 2 puffs into the lungs 2 (two) times daily.   Yes Historical Provider, MD  citalopram (CELEXA) 20 MG  tablet Take 20 mg by mouth daily.   Yes Historical Provider, MD  clopidogrel (PLAVIX) 75 MG tablet Take 75 mg by mouth every morning.    Yes Historical Provider, MD  ezetimibe (ZETIA) 10 MG tablet Take 10 mg by mouth daily.   Yes Historical Provider, MD  furosemide (LASIX) 40 MG tablet Take 40 mg by mouth 2 (two) times daily.   Yes Historical Provider, MD  gabapentin (NEURONTIN) 300 MG capsule Take 300 mg by mouth 2 (two) times daily.    Yes Historical Provider, MD  glipiZIDE (GLUCOTROL) 5 MG  tablet Take 5 mg by mouth daily before supper.    Yes Historical Provider, MD  losartan (COZAAR) 100 MG tablet Take 100 mg by mouth daily with breakfast.    Yes Historical Provider, MD  MAGNESIUM PO Take 1-2 tablets by mouth daily as needed (constipation).    Yes Historical Provider, MD  OXYGEN-HELIUM IN Inhale 3 L into the lungs daily.    Yes Historical Provider, MD  potassium chloride SA (K-DUR,KLOR-CON) 20 MEQ tablet Take 40 mEq by mouth daily.   Yes Historical Provider, MD  doxycycline (VIBRAMYCIN) 100 MG capsule Take 1 capsule (100 mg total) by mouth 2 (two) times daily. 11/26/14   Orpah Greek, MD   BP 159/91 mmHg  Pulse 110  Temp(Src) 98.2 F (36.8 C) (Oral)  Resp 18  SpO2 96%  LMP 07/26/2009 Physical Exam  Constitutional: She is oriented to person, place, and time. She appears well-developed and well-nourished. No distress.  HENT:  Head: Normocephalic and atraumatic.  Right Ear: Hearing normal.  Left Ear: Hearing normal.  Nose: Nose normal.  Mouth/Throat: Oropharynx is clear and moist and mucous membranes are normal.  Eyes: Conjunctivae and EOM are normal. Pupils are equal, round, and reactive to light.  Neck: Normal range of motion. Neck supple.  Cardiovascular: Regular rhythm, S1 normal and S2 normal.  Exam reveals no gallop and no friction rub.   No murmur heard. Pulmonary/Chest: Effort normal and breath sounds normal. No respiratory distress. She exhibits no tenderness.    Abdominal: Soft. Normal appearance and bowel sounds are normal. There is no hepatosplenomegaly. There is no tenderness. There is no rebound, no guarding, no tenderness at McBurney's point and negative Murphy's sign. No hernia.  Musculoskeletal: Normal range of motion.  Neurological: She is alert and oriented to person, place, and time. She has normal strength. No cranial nerve deficit or sensory deficit. Coordination normal. GCS eye subscore is 4. GCS verbal subscore is 5. GCS motor subscore is 6.  Skin: Skin is warm, dry and intact. Rash (Circular erythematous, nonpalpable rash right flank. Central clearing present.) noted. No cyanosis.  Psychiatric: She has a normal mood and affect. Her speech is normal and behavior is normal. Thought content normal.  Nursing note and vitals reviewed.      ED Course  Procedures (including critical care time) Labs Review [place] ANTIBODIES    Imaging Review No results found.   EKG Interpretation None      MDM   Final diagnoses:  Tick bite   possible Lyme's disease  Patient presents to ER for evaluation of rash at the site of the tick bite. Patient has an expanding erythematous rash with central clearing centered around the area where the tick was found. He has not had any febrile illness or flulike illness. No other rash noted. Patient appears well. Patient tells me that she is scheduled to have endovascular aortic valve repair in 2 weeks. Will order Lyme disease antibodies for follow-up by cardiology to make determination whether or not you want to proceed with the procedure. Will empirically cover with doxycycline.    Orpah Greek, MD 11/26/14 [phone]

## 2014-11-27 ENCOUNTER — Telehealth: Payer: Self-pay | Admitting: *Deleted

## 2014-11-27 LAB — B. BURGDORFI ANTIBODIES: B burgdorferi Ab IgG+IgM: 0.14 {ISR}

## 2014-11-27 NOTE — Telephone Encounter (Signed)
Spoke with medicaid prior auth department and gave information needed for Zetia prior auth.  Phone number for Medicaid is [phone].  Decision to be made in next 48 hours--Case number [id number]

## 2014-11-28 ENCOUNTER — Telehealth: Payer: Self-pay | Admitting: Cardiovascular Disease

## 2014-11-28 NOTE — Telephone Encounter (Signed)
lmtcb

## 2014-11-28 NOTE — Telephone Encounter (Signed)
New Msg       Patient calling, wants to know when she should stop taking medications because she is having heart surgery on 12/15. Patient also needs to know which medications to stop. Please contact at [phone].

## 2014-11-29 NOTE — Telephone Encounter (Signed)
Spoke with patient to let her know she needs to stop plavix on December 10th for her TAVR surgery on the 15th.  She understands.  Told her to call back if there were any other questions.

## 2014-11-29 NOTE — Telephone Encounter (Signed)
Spoke with Medicaid and Zetia approved through 11/22/15. Pt's pharmacy ([facility] [address]) notified.

## 2014-12-01 ENCOUNTER — Telehealth: Payer: Self-pay | Admitting: Internal Medicine

## 2014-12-01 ENCOUNTER — Ambulatory Visit: Payer: Medicaid Other | Admitting: Physical Therapy

## 2014-12-01 NOTE — Telephone Encounter (Signed)
attemtped to call the pt but no answer and no VM.  Will try back later.

## 2014-12-04 NOTE — Telephone Encounter (Signed)
Appt set for tomorrow with MW to address oxygen needs. Pt is asking for an portable system. Kelley Bing, CMA

## 2014-12-05 ENCOUNTER — Encounter: Payer: Self-pay | Admitting: Internal Medicine

## 2014-12-05 ENCOUNTER — Ambulatory Visit (INDEPENDENT_AMBULATORY_CARE_PROVIDER_SITE_OTHER): Payer: Medicaid Other | Admitting: Internal Medicine

## 2014-12-05 VITALS — BP 130/88 | HR 89 | Ht 63.0 in | Wt 354.6 lb

## 2014-12-05 DIAGNOSIS — G4734 Idiopathic sleep related nonobstructive alveolar hypoventilation: Secondary | ICD-10-CM

## 2014-12-05 DIAGNOSIS — R0600 Dyspnea, unspecified: Secondary | ICD-10-CM

## 2014-12-05 DIAGNOSIS — J4530 Mild persistent asthma, uncomplicated: Secondary | ICD-10-CM

## 2014-12-05 NOTE — Patient Instructions (Signed)
No change in recommendations   Your breathing appears to be most affected by your weight and heart condition, not your lungs   If you are satisfied with your treatment plan,  let your doctor know and he/she can either refill your medications or you can return here when your prescription runs out.     If in any way you are not 100% satisfied,  please tell us.  If 100% better, tell your friends!  Pulmonary follow up is as needed

## 2014-12-05 NOTE — Progress Notes (Signed)
Subjective:    Patient ID: [name], female    DOB: [birth year] MRN: [id number]  Brief patient profile:  76 yowf quit smoking in 2002 at wt 280 with severe sob/wheeze/ cough to point of passing out never completely improved and maintained on advair longterm but worse x 01/2013 so changed symbicort but still not back to baseline so referred 05/11/2013 to pulmonary clinic by Dr Jana Hakim with no airflow on spirometry 06/08/13   History of Present Illness  05/11/2013 1st pulmonary eval cc doe x one decade but much worse x 3 months assoc dry cough on symbicort 160 but doesn't use it first thing in am and using lots of saba more day than night.   rec Plan A = automatic = symbicort 160 Take 2 puffs first thing in am and then another 2 puffs about 12 hours later follow by tudorza one twice daily  Only use your albuterol (proventil) as a rescue medication  GERD diet   06/08/2013 f/u ov/[name] did not bring meds as rec Chief Complaint  Patient presents with  . Follow-up    Breathing and cough no better since the last visit. No new co's today.   using neb 3-4 day with cough worse than sob.   >pred taper, added PPI/pepcid  07/07/2013 Follow and Med review  Returns for follow up and med review .  Has medicaid but sometimes can not afford $3 to get rx.  Was not able to get medications , she does not have the money to pick up prescriptions.  Used her husband's rx of Tramadol that helps with cough.  Feels humid weather makes her breathing worse.  With mild flare cough last ov, given steroid pack , did not pick up steroids.  +snoring and daytime sleepiness Plans for right knee surgery in couple of weeks > tol well  rec Begin Prilosec and Pepcid when able.      09/12/2013 f/u ov/[name]  Chief Complaint  Patient presents with  . Follow-up    Breathing has improved some, only used neb txs a couple times since her last visit. Cough is the same- no better or worse.    not using hfa saba  first as rec.  rec Continue symbicort 160 Take 2 puffs first thing in am and then another 2 puffs about 12 hours later.  Only use your albuterol as a rescue medication to be used if you can't catch your breath by resting or doing a relaxed purse lip breathing pattern. The less you use it, the better it will work when you need it.  Continue 02 at bedtime at 2lpm indefinitely (weight loss may eventually allow you to stop it but we'll need to do another overnight study to verify first) Please see patient coordinator before you leave today  to schedule overnight study now on 2lpm Use med calendar   12/05/2014 f/u ov/[name] re:  Obesity/ asthma/ noct hypoxemia on 3lpm ? Qualifies for port 02 / no med calendar Chief Complaint  Patient presents with  . Follow-up    Pt c/o increased SOB for the past 6 months. She gets out of breath walking approx 20 ft. She states that she has been using her o2 during the day and this sometimes helps. She is using rescue inhaler daily and albuterol neb about every 2 wks.   symbiocort 160 2bid and uses alb 30 min later but not for the rest of the day    No obvious daytime variabilty or assoc chronic  cough or cp or chest tightness, subjective wheeze overt sinus or hb symptoms. No unusual exp hx or h/o childhood pna/ asthma or knowledge of premature birth.   Sleeping ok without nocturnal  or early am exacerbation  of respiratory  c/o's or need for noct saba. Also denies any obvious fluctuation of symptoms with weather or environmental changes or other aggravating or alleviating factors except as outlined above    Current Medications, Allergies, Past Medical History, Past Surgical History, Family History, and Social History were reviewed in [employer] record.  ROS  The following are not active complaints unless bolded sore throat, dysphagia, dental problems, itching, sneezing,  nasal congestion or excess/ purulent secretions, ear ache,   fever,  chills, sweats, unintended wt loss, pleuritic or exertional cp, hemoptysis,  orthopnea pnd or leg swelling, presyncope, palpitations, heartburn, abdominal pain, anorexia, nausea, vomiting, diarrhea  or change in bowel or urinary habits, change in stools or urine, dysuria,hematuria,  rash, arthralgias, visual complaints, headache, numbness weakness or ataxia or problems with walking or coordination,  change in mood/affect or memory.           Objective:   Physical Exam  06/08/2013      330>331 07/07/2013 >  321 09/12/2013 > 12/05/2014  355   HEENT: nl dentition, turbinates, and orophanx. Nl external ear canals without cough reflex   NECK :  without JVD/Nodes/TM/ nl carotid upstrokes bilaterally   LUNGS: no acc muscle use, clear to A and P bilaterally without cough on insp or exp maneuvers   CV:  RRR  no s3  But II to III/ VI sem or increase in P2, no edema   ABD:  soft and nontender with nl excursion in the supine position. No bruits or organomegaly, bowel sounds nl  MS:  warm without deformities, calf tenderness, cyanosis or clubbing  SKIN: warm and dry without lesions    NEURO:  alert, approp, no deficits     cta chest 02/21/13 No evidence of pulmonary embolism.  8 mm patchy/nodular opacity in the superior segment right lower  lobe, favored to be infectious/inflammatory See   09/01/13 CT: resolved        Assessment & Plan:

## 2014-12-05 NOTE — Assessment & Plan Note (Signed)
Observed today s desat, likely obesity and ? chf component but nothing to offer in pulmonary clinic  Pulmonary f/u is prn

## 2014-12-05 NOTE — Assessment & Plan Note (Signed)
-  ONO 07/07/2013 >Positive (>42 min <88%) >begin O2 At bedtime  2 l/m (07/14/2013 ) > recheck on 2lpm 09/19/13 with 28 min < 88% so rec repeat on 3lpm  -set up for sleep study. 07/14/2013  >>min OSA (AHI 5)  - ono 3lpm Sep 28 2013 desat < 88% x 1.7 min - 12/05/2014   Walked RA x one lap @ 185 stopped due to  Sob, no desat/ slow pace    Adequate control on present rx, reviewed > no change in rx needed  > does not qualify for amb 02

## 2014-12-05 NOTE — Assessment & Plan Note (Signed)
-   Spirometry 06/08/2013 >>> no airflow obstruction -med calendar 07/07/2013 > not using 12/05/2014   Not clear why she feels she needs am saba   The proper method of use, as well as anticipated side effects, of a metered-dose inhaler are discussed and demonstrated to the patient. Improved effectiveness after extensive coaching during this visit to a level of approximately  90% so continue symbicort 160 2bid and reduce saba if possible    Each maintenance medication was reviewed in detail including most importantly the difference between maintenance and as needed and under what circumstances the prns are to be used.  Please see instructions for details which were reviewed in writing and the patient given a copy.

## 2014-12-06 ENCOUNTER — Ambulatory Visit: Payer: Medicaid Other | Attending: Surgery | Admitting: Physical Therapy

## 2014-12-06 ENCOUNTER — Encounter: Payer: Self-pay | Admitting: Physical Therapy

## 2014-12-06 DIAGNOSIS — R262 Difficulty in walking, not elsewhere classified: Secondary | ICD-10-CM | POA: Insufficient documentation

## 2014-12-06 NOTE — Therapy (Signed)
Outpatient Rehabilitation [facility] [address] [place], [place], [address] Phone: [phone]   Fax:  [phone]  Physical Therapy Evaluation  Patient Details  Name: [name] MRN: [id number] Date of Birth: 26-Nov-1961  Encounter Date: 12/06/2014      PT End of Session - 12/06/14 1208    Visit Number 1   PT Start Time 1105   PT Stop Time 1150   PT Time Calculation (min) 45 min      Past Medical History  Diagnosis Date  . Asthma   . Hypertension   . Hyperlipidemia   . GERD (gastroesophageal reflux disease)   . Depression   . Blood transfusion     2013[facility]  . Headache(784.0)     h/o migraines  . Pulmonary embolism 02/08/12    S/P RT TOTAL KNEE ON 02/03/12--ON 02/08/12--DEVELOPED ACUTE SOB AND CHEST PAIN--AND DIAGNOSED WITH  PULMONARY EMBOLUS AND PNEUMONIA  . Restless leg syndrome   . Myocardial infarction     PT THINKS SHE WAS DX WITH MI AT THE TIME OF HEART STENTING  . CAD (coronary artery disease)     Cath 2010 with DES x 1 RCA-- PT'S CARDIOLOGIST IS DR. Cassoday  . Uterine fibroid     NO PROBLEMS AT PRESENT FROM THE FIBROIDS-STATES SHE IS POST MENOPAUSAL-LAST MENSES 2010 EXCEPT FOR EPISODE THIS YR OF BLEEDING RELATED TO FIBROIDS.  Marland Kitchen Heart murmur   . Pneumonia 02/08/12    HOSPITALIZED AT [facility] WITH PNEUMONIA AND WITH PULMONARY EMBOLUS  . Diabetes mellitus DIAGNOSED IN2010    ORAL MEDS  . Arthritis     PAIN AND SEVERE OA LEFT KNEE ; S/P RIGHT TKA ON 02/03/12; HAS LOWER BACK PAIN-UNABLE TO STAND MORE THAN 10 MIN; ARTHRITIS "ALL OVER"  . Neck pain   . Weakness     BOTH HANDS - S/P BILATERAL CARPAL TUNNEL RELEASE--BUT STILL HAS WEAKNESS--OFTEN DROPS THINGS  . Shortness of breath     with exertion  . Eczema     on back  . Kidney stones   . Anemia   . COPD (chronic obstructive pulmonary disease)   . Sleep apnea     uses 3 liters O2 at night   . Aortic valve stenosis, severe   . Aortic stenosis   . Morbid obesity with body mass index of  60.0-69.9 in adult   . Chronic diastolic congestive heart failure     Past Surgical History  Procedure Laterality Date  . Tubal ligation    . Carpal tunnel release      Bilateral  . Cholecystectomy    . Hernia repair    . Cardiac catheterization    . Knee arthroplasty  02/03/2012    Procedure: COMPUTER ASSISTED TOTAL KNEE ARTHROPLASTY;  Surgeon: Mcarthur Rossetti, MD;  Location: [place];  Service: Orthopedics;  Laterality: Right;  Right total knee arthroplasty  . Coronary angioplasty      2010 has stent in place  . Total knee arthroplasty  09/10/2012    Procedure: TOTAL KNEE ARTHROPLASTY;  Surgeon: Mcarthur Rossetti, MD;  Location: [facility];  Service: Orthopedics;  Laterality: Left;  . Trigger finger release  09/10/2012    Procedure: RELEASE TRIGGER FINGER/A-1 PULLEY;  Surgeon: Mcarthur Rossetti, MD;  Location: [facility];  Service: Orthopedics;  Laterality: Right;  Right Ring Finger  . Tee without cardioversion N/A 03/14/2013    Procedure: TRANSESOPHAGEAL ECHOCARDIOGRAM (TEE);  Surgeon: Lelon Perla, MD;  Location: [place];  Service: Cardiovascular;  Laterality: N/A;  . Joint replacement      bil total knees  . Total knee revision Right 07/15/2013    Procedure: REVISION ARTHROPLASTY RIGHT KNEE;  Surgeon: Mcarthur Rossetti, MD;  Location: [facility];  Service: Orthopedics;  Laterality: Right;  . Cystoscopy w/ retrogrades Right 09/21/2013    Procedure: CYSTOSCOPY WITH RIGHT RETROGRADE PYELOGRAM RIGHT DOUBLE J STENT ;  Surgeon: Fredricka Bonine, MD;  Location: [facility];  Service: Urology;  Laterality: Right;  . Cystoscopy with ureteroscopy and stent placement Right 10/25/2013    Procedure: CYSTOSCOPY RIGHT URETEROSCOPY HOLMIUM LASER LITHO AND STENT PLACEMENT;  Surgeon: Fredricka Bonine, MD;  Location: [facility];  Service: Urology;  Laterality: Right;  . Tee without cardioversion N/A 11/14/2014    Procedure: TRANSESOPHAGEAL ECHOCARDIOGRAM (TEE);  Surgeon: Lelon Perla, MD;  Location: [facility];  Service: Cardiovascular;  Laterality: N/A;    LMP 07/26/2009  Visit Diagnosis:  Difficulty walking - Plan: PT PLAN OF CARE CERT/RE-CERT      Subjective Assessment - 12/06/14 1116    Symptoms Pt presents to OP PT with primary complaint of SOB with activity.   Patient Stated Goals improve mobility   Currently in Pain? Yes   Pain Score 6    Pain Location Back   Pain Orientation Lower   Pain Descriptors / Indicators Other (Comment)  catching   Pain Type Chronic pain   Pain Onset More than a month ago   Pain Frequency Intermittent   Aggravating Factors  certain positions   Pain Relieving Factors tylenol and heat   Effect of Pain on Daily Activities PT to monitor pain but no pain goal to follow secondary to chronic nature of pain and outside the scope of this referral.           [facility] PT Assessment - 12/06/14 0001    Assessment   Medical Diagnosis aortic stenosis   Onset Date 06/06/14  pt reports approximately 6-7 months   Precautions   Precautions Fall   Precaution Comments supplemental O2 PRN (3L)   Restrictions   Weight Bearing Restrictions No   Balance Screen   Has the patient fallen in the past 6 months Yes   How many times? 2  fell the other night walking through the house in socks   Has the patient had a decrease in activity level because of a fear of falling?  No   Is the patient reluctant to leave their home because of a fear of falling?  No   Home Environment   Living Enviornment Private residence   Living Arrangements Spouse/significant other   Home Access Stairs to enter   Entrance Stairs-Number of Steps 3   Entrance Stairs-Rails None  pt holds to house   Prior Function   Level of [place]t with gait   Posture/Postural Control   Posture Comments mild to moderate forward head   AROM   Overall AROM  Within functional limits for tasks performed   Strength   Overall Strength Within functional limits for  tasks performed   Grip (lbs) R 60   Grip (lbs) L 60   Ambulation/Gait   Ambulation/Gait Yes   Ambulation/Gait Assistance 7: Independent   Gait Pattern Wide base of support                  Plan - 12/06/14 1208    Clinical Impression Statement Pt appears to be primarily limited with mobility due to significant SOB with activity. Pt  only able to amb. 100-150' at a time before requiring rest break. Pt also at increased risk pf falling per 4 stage balance test and Timed up and go test.    PT Frequency One time visit                    [facility] Pre-Surgical Assessment - 12/06/14 0001    5 Meter Walk Test- trial 1 7 sec   5 Meter Walk Test- trial 2 7 sec.    5 Meter Walk Test- trial 3 7 sec.  >6 sec indicates increased fall risk   5 meter walk test average 7 sec   Timed Up & Go Test trial  19 sec.   Comments > 12 sec indicates increased fall risk; negotiation of portable O2 increased time    4 Stage Balance Test tolerated for:  10 sec.   4 Stage Balance Test Position 2   comment indicates increased fall risk   Comment Able to arise with UE support however not tested due to back pain.   ADL/IADL Independent with: Bathing;Dressing;Meal prep;Finances   ADL/IADL Needs Assistance with: Valla Leaver work   ADL/IADL Fraility Index Vulnerable   6 Minute Walk- Baseline yes   BP (mmHg) 140/78 mmHg   HR (bpm) 80   02 Sat (%RA) 97 %   Modified Borg Scale for Dyspnea 0- Nothing at all   Perceived Rate of Exertion (Borg) 6-   6 Minute Walk Post Test yes   BP (mmHg) 160/88 mmHg   HR (bpm) 100   02 Sat (%RA) 97 %  3 L O2   Modified Borg Scale for Dyspnea 3- Moderate shortness of breath or breathing difficulty   Perceived Rate of Exertion (Borg) 15- Hard   Aerobic Endurance Distance Walked 410  feet   Endurance additional comments Pt required 3 extended seated rest breaks due to SOB during the 6 minute walk test. Pt utilized 3L supplemtal O2 throughout eval today.                    Problem List Patient Active Problem List   Diagnosis Date Noted  . Aortic stenosis   . Morbid obesity with body mass index of 60.0-69.9 in adult   . Chronic diastolic congestive heart failure   . Acute pyelonephritis 09/23/2013  . Leukocytosis, unspecified 09/22/2013  . Hydronephrosis, right 09/22/2013  . COPD (chronic obstructive pulmonary disease) 09/21/2013  . E. coli UTI 09/21/2013  . OSA (obstructive sleep apnea) 09/21/2013  . Nephrolithiasis 09/21/2013  . Nocturnal hypoxemia 09/12/2013  . 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 Abnormal CT scan, chest 05/12/2013  . Hyperlipidemia 11/30/2012  . Degenerative arthritis of left knee 09/10/2012  . Pulmonary embolism 07/09/2012  .   04/08/2012  . Encounter for long-term (current) use of anticoagulants 02/20/2012  . Hematuria 02/15/2012  . PE (pulmonary embolism) 02/08/2012  . HAP (hospital-acquired pneumonia) 02/08/2012  . Anemia 02/08/2012  . DM (diabetes mellitus) 02/08/2012  . Asthma 02/08/2012  . Hypokalemia 02/08/2012  . Hypertension   . GERD (gastroesophageal reflux disease)   . Degenerative arthritis of right knee 02/03/2012  . CAD (coronary artery disease) 12/08/2011  . Dyspnea 12/08/2011    Romualdo Bolk, PT, DPT 12/06/2014 12:15 PM Phone: [phone] Fax: [phone]

## 2014-12-07 ENCOUNTER — Encounter (HOSPITAL_COMMUNITY): Payer: Self-pay | Admitting: Cardiovascular Disease

## 2014-12-08 ENCOUNTER — Encounter (HOSPITAL_COMMUNITY)
Admission: RE | Admit: 2014-12-08 | Discharge: 2014-12-08 | Disposition: A | Discharge status: Home / Self Care | Payer: Medicaid Other | Source: Ambulatory Visit | Attending: Cardiovascular Disease | Admitting: Cardiovascular Disease

## 2014-12-08 ENCOUNTER — Ambulatory Visit (HOSPITAL_COMMUNITY)
Admission: RE | Admit: 2014-12-08 | Discharge: 2014-12-08 | Disposition: A | Discharge status: Home / Self Care | Payer: Medicaid Other | Source: Ambulatory Visit | Attending: Cardiovascular Disease | Admitting: Cardiovascular Disease

## 2014-12-08 ENCOUNTER — Encounter (HOSPITAL_COMMUNITY): Payer: Self-pay

## 2014-12-08 VITALS — BP 129/58 | HR 83 | Temp 98.7°F | Resp 20 | Ht 63.0 in | Wt 348.3 lb

## 2014-12-08 DIAGNOSIS — I350 Nonrheumatic aortic (valve) stenosis: Secondary | ICD-10-CM

## 2014-12-08 DIAGNOSIS — Z01818 Encounter for other preprocedural examination: Secondary | ICD-10-CM | POA: Diagnosis present

## 2014-12-08 LAB — URINALYSIS, ROUTINE W REFLEX MICROSCOPIC
Bilirubin Urine: NEGATIVE
Glucose, UA: NEGATIVE mg/dL
Hgb urine dipstick: NEGATIVE
Ketones, ur: NEGATIVE mg/dL
Nitrite: NEGATIVE
Protein, ur: NEGATIVE mg/dL
SPECIFIC GRAVITY, URINE: 1.027 (ref 1.005–1.030)
UROBILINOGEN UA: 1 mg/dL (ref 0.0–1.0)
pH: 7.5 (ref 5.0–8.0)

## 2014-12-08 LAB — URINE MICROSCOPIC-ADD ON

## 2014-12-08 LAB — CBC
HCT: 37.5 % (ref 36.0–46.0)
HEMOGLOBIN: 12.1 g/dL (ref 12.0–15.0)
MCH: 27 pg (ref 26.0–34.0)
MCHC: 32.3 g/dL (ref 30.0–36.0)
MCV: 83.7 fL (ref 78.0–100.0)
Platelets: 213 10*3/uL (ref 150–400)
RBC: 4.48 MIL/uL (ref 3.87–5.11)
RDW: 14.4 % (ref 11.5–15.5)
WBC: 7.4 10*3/uL (ref 4.0–10.5)

## 2014-12-08 LAB — COMPREHENSIVE METABOLIC PANEL
ALBUMIN: 3.3 g/dL — AB (ref 3.5–5.2)
ALT: 11 U/L (ref 0–35)
AST: 13 U/L (ref 0–37)
Alkaline Phosphatase: 77 U/L (ref 39–117)
Anion gap: 14 (ref 5–15)
BUN: 14 mg/dL (ref 6–23)
CO2: 27 mEq/L (ref 19–32)
Calcium: 9.6 mg/dL (ref 8.4–10.5)
Chloride: 98 mEq/L (ref 96–112)
Creatinine, Ser: 0.73 mg/dL (ref 0.50–1.10)
GFR calc non Af Amer: >90 mL/min (ref >90)
GLUCOSE: 134 mg/dL — AB (ref 70–99)
POTASSIUM: 4.5 meq/L (ref 3.7–5.3)
SODIUM: 139 meq/L (ref 137–147)
Total Bilirubin: 0.8 mg/dL (ref 0.3–1.2)
Total Protein: 7.2 g/dL (ref 6.0–8.3)

## 2014-12-08 LAB — BLOOD GAS, ARTERIAL
Acid-Base Excess: 7.1 mmol/L — ABNORMAL HIGH (ref 0.0–2.0)
BICARBONATE: 32.2 meq/L — AB (ref 20.0–24.0)
Drawn by: 206361
O2 Content: 3 L/min
O2 Saturation: 96.5 %
PCO2 ART: 56.1 mmHg — AB (ref 35.0–45.0)
PH ART: 7.377 (ref 7.350–7.450)
Patient temperature: 98.6
TCO2: 33.9 mmol/L (ref 0–100)
pO2, Arterial: 89.2 mmHg (ref 80.0–100.0)

## 2014-12-08 LAB — SURGICAL PCR SCREEN
MRSA, PCR: NEGATIVE
STAPHYLOCOCCUS AUREUS: NEGATIVE

## 2014-12-08 LAB — APTT: aPTT: 28 seconds (ref 24–37)

## 2014-12-08 LAB — PROTIME-INR
INR: 1.02 (ref 0.00–1.49)
PROTHROMBIN TIME: 13.5 s (ref 11.6–15.2)

## 2014-12-08 LAB — HEMOGLOBIN A1C
Hgb A1c MFr Bld: 6.7 % — ABNORMAL HIGH (ref <5.7)
Mean Plasma Glucose: 146 mg/dL — ABNORMAL HIGH (ref <117)

## 2014-12-08 IMAGING — CR DG CHEST 2V
2 series · 2 of 2 positions shown · non-contrast
Comparison: [DATE]

CLINICAL DATA: Preop for aortic stenosis surgery

EXAM:
CHEST  2 VIEW

[w chest pa]
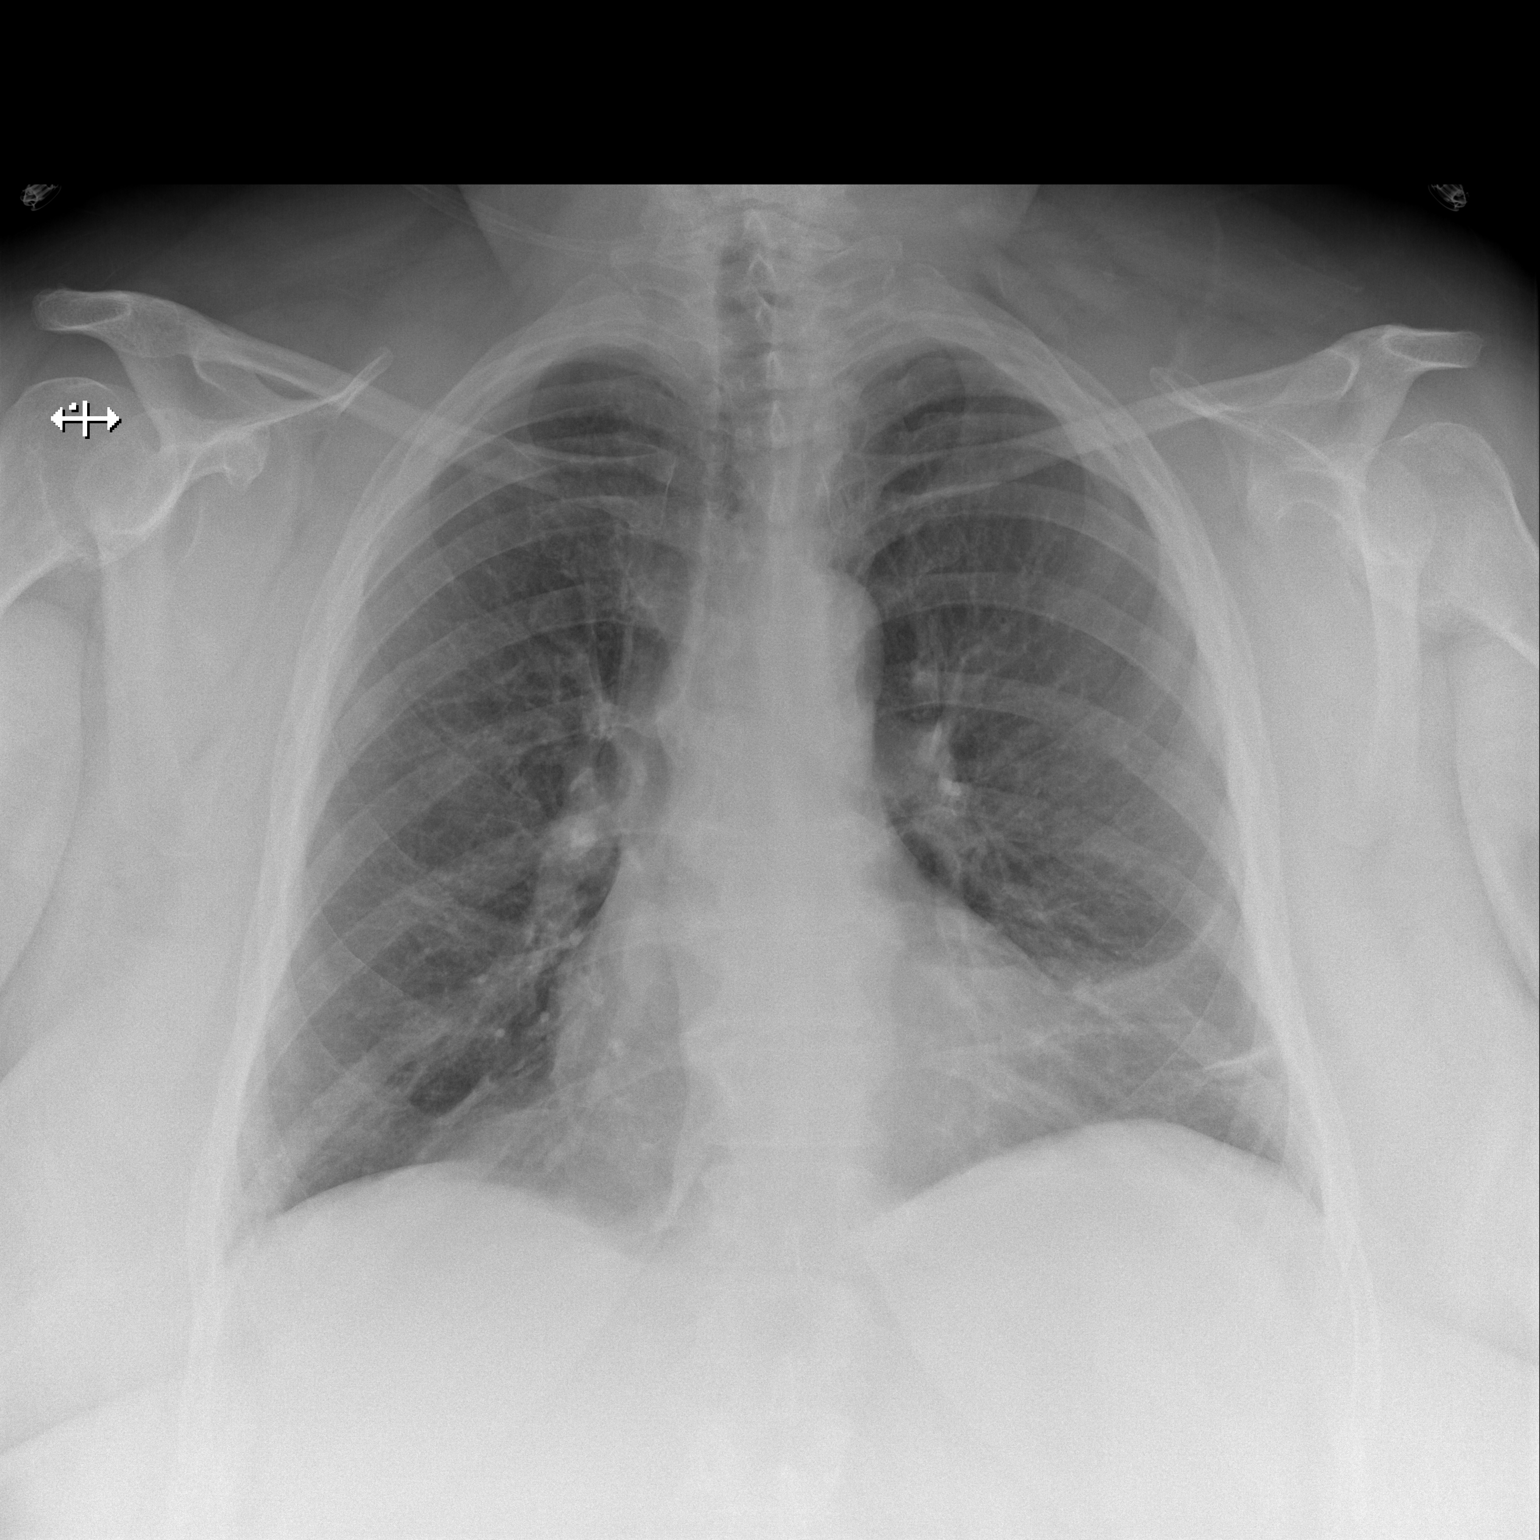

[w chest lat]
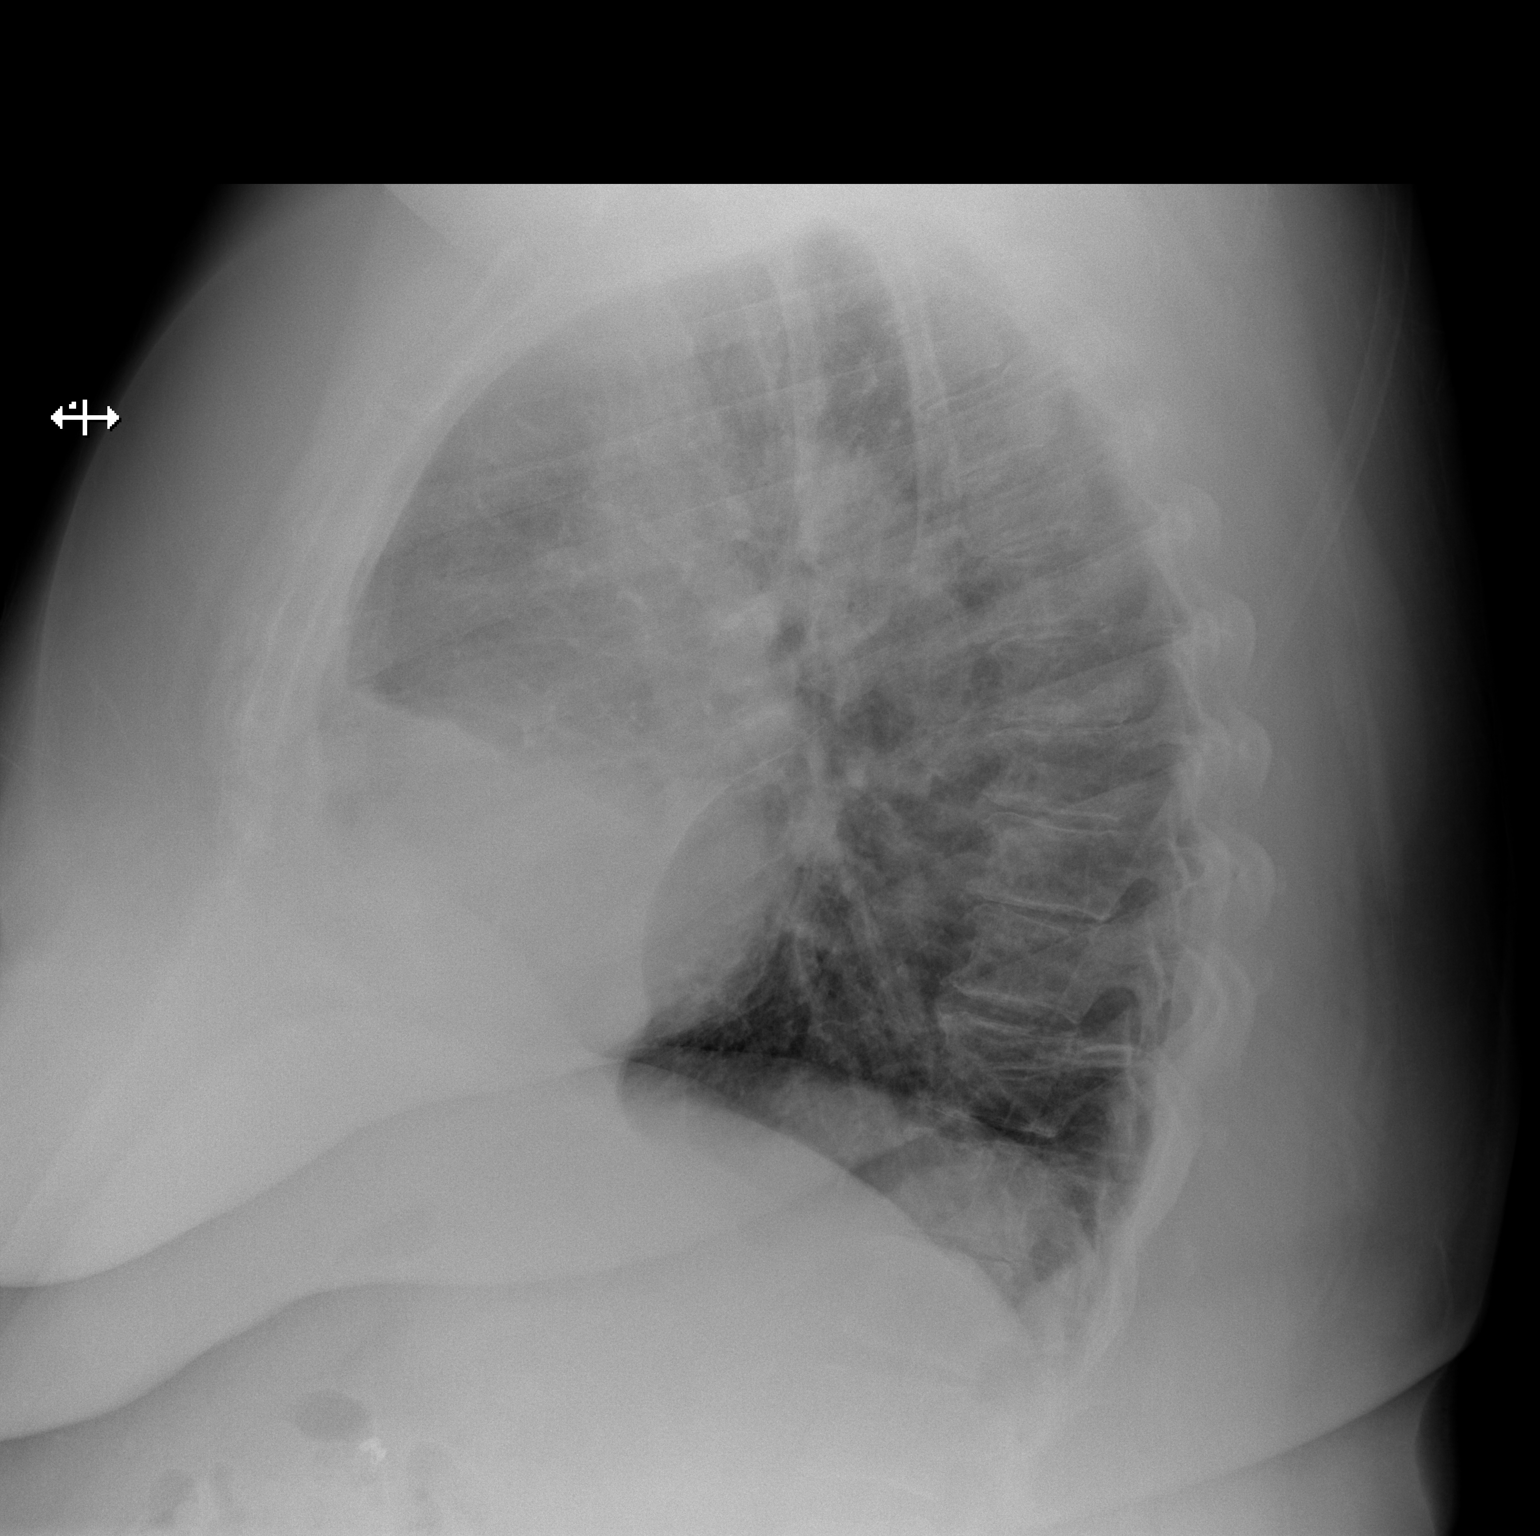

[2 of 2 positions shown; findings below may reference images not displayed]

FINDINGS: Cardiomediastinal silhouette is stable. No acute infiltrate or
pleural effusion. No pulmonary edema. Stable linear atelectasis or
scarring in left lower lobe. Mild degenerative changes mid and lower
thoracic spine.
IMPRESSION: No active cardiopulmonary disease.

## 2014-12-08 NOTE — Progress Notes (Addendum)
[name] at [place] stated pt. Can take aspirin up to day of surgery. Stated dopplers were not need.  Sleep study 07/2013 in epic (under procedures)  Pt. Informed nurse she had a tick bite 1 1/2 weeks ago to the right breast. Martin Majestic to [facility] ED for treatment. Started on doxycycline and will finish on 12/11/14. Redness has disappeared and scab is almost falling off of bite location. Notified [name] at [employer] and also informed [employer].

## 2014-12-08 NOTE — Progress Notes (Signed)
Anesthesia Chart Review:  Pt is 53 year old female scheduled for transfemoral TAVR, intraoperative TEE on 12/12/2014 with Dr. Angelena Form.   PMH: CAD (cath 2010 with DES x1 to RCA), MI, chronic diastolic CHF, PE (7588), severe aortic stenosis, HTN, DM, OSA, COPD, anemia, asthma,  Hyperlipidemia. Morbid obesity, BMI 62. Former smoker. Home oxygen at night.   Medications include; ASA, plavix. Plavix to be stopped 12/10. ASA will not be stopped.   Preoperative labs reviewed.    EKG: NSR. Low voltage QRS. Cannot rule out anterior infarct, age undetermined.   TEE 11/14/2014: - Left ventricle: Systolic function was normal. The estimated ejection fraction was in the range of 55% to 60%. Wall motion was normal; there were no regional wall motion abnormalities. - Aortic valve: Functionally bicuspid. There was severe stenosis. There was mild regurgitation. - Mitral valve: Mildly calcified annulus. No evidence of vegetation. There was mild regurgitation. - Left atrium: The atrium was moderately dilated. No evidence of thrombus in the atrial cavity or appendage. - Atrial septum: No defect or patent foramen ovale was identified. - Tricuspid valve: No evidence of vegetation. - Pulmonic valve: No evidence of vegetation.  Impressions: - Normal LV function; functionally bicuspid aortic valve (fusion of noncoronary and left cusps); severe AS (AVA 0.9 cm2 by planimetry; unable to obtain gradients with transgastric views); mild AI; mild MR and TR.  Cardiac cath 09/14/2014: Impression: 1. Single vessel CAD with ostial RCA stenosis, patent distal RCA stent 2. Severe aortic valve stenosis 3. Morbid obesity  Recommendations: She has progression of her CAD with ostial RCA stenosis and severe AS. She will need AVR and bypass of RCA. I will refer her to see one of our CT surgeons. She will be high risk for traditional AVR due to size. May have to consider TAVR/PCI of RCA.   If no changes, I anticipate pt can  proceed with surgery as scheduled.   Willeen Cass, FNP-BC [facility] Short Stay Surgical Center/Anesthesiology Phone: [phone] 12/08/2014 3:54 PM

## 2014-12-08 NOTE — Pre-Procedure Instructions (Signed)
[name]  12/08/2014   Your procedure is scheduled on:  Tues., Dec.15  Report to [facility] Main Entrance "A" at 5:30  AM.  Call this number if you have problems the morning of surgery: [phone]               If any questions prior to surgery call pre-admission [phone]   Remember:   Do not eat food or drink liquids after midnight.   Take these medicines the morning of surgery with A SIP OF WATER: symbicort inhaler(bring to hospital), albuterol inhaler (bring to hospital), celexa, neurontin (gabapentin), doxycycline(vibramycin)               Stop nonsteroidal inflammatory drugs (NSAIDS),:advil, ibuprofen, aleve as of today.             Stop aspirin on 12/11/14    Do not wear jewelry, make-up or nail polish.  Do not wear lotions, powders, or perfumes. You may wear deodorant.  Do not shave 48 hours prior to surgery. Men may shave face and neck.  Do not bring valuables to the hospital.  [facility] is not responsible                  for any belongings or valuables.               Contacts, dentures or bridgework may not be worn into surgery.  Leave suitcase in the car. After surgery it may be brought to your room.  For patients admitted to the hospital, discharge time is determined by your                treatment team.               Patients discharged the day of surgery will not be allowed to drive  home.  Name and phone number of your driver:   Special Instructions: review preparing for surgery   Please read over the following fact sheets that you were given: Pain Booklet, Coughing and Deep Breathing, Blood Transfusion Information, MRSA Information and Surgical Site Infection Prevention

## 2014-12-11 MED ORDER — METOPROLOL TARTRATE 12.5 MG HALF TABLET
12.5000 mg | ORAL_TABLET | Freq: Once | ORAL | Status: AC
  Administered 2014-12-12: 12.5 mg via ORAL
  Filled 2014-12-11: qty 1

## 2014-12-11 MED ORDER — PHENYLEPHRINE HCL 10 MG/ML IJ SOLN
30.0000 ug/min | INTRAVENOUS | Status: DC
  Filled 2014-12-11: qty 2

## 2014-12-11 MED ORDER — SODIUM CHLORIDE 0.9 % IV SOLN: INTRAVENOUS | Status: DC

## 2014-12-11 MED ORDER — DEXTROSE 5 % IV SOLN
1.5000 g | INTRAVENOUS | Status: AC
  Administered 2014-12-12: 1.5 g via INTRAVENOUS
  Filled 2014-12-11 (×2): qty 1.5

## 2014-12-11 MED ORDER — CHLORHEXIDINE GLUCONATE 4 % EX LIQD
30.0000 mL | CUTANEOUS | Status: DC
  Filled 2014-12-11: qty 30

## 2014-12-11 MED ORDER — NITROGLYCERIN IN D5W 200-5 MCG/ML-% IV SOLN
2.0000 ug/min | INTRAVENOUS | Status: AC
  Administered 2014-12-12: 5 ug/min via INTRAVENOUS
  Filled 2014-12-11: qty 250

## 2014-12-11 MED ORDER — VANCOMYCIN HCL 10 G IV SOLR
1500.0000 mg | INTRAVENOUS | Status: AC
  Administered 2014-12-12: 1500 mg via INTRAVENOUS
  Filled 2014-12-11 (×2): qty 1500

## 2014-12-11 MED ORDER — EPINEPHRINE HCL 1 MG/ML IJ SOLN
0.0000 ug/min | INTRAVENOUS | Status: DC
  Filled 2014-12-11: qty 4

## 2014-12-11 MED ORDER — DEXTROSE 5 % IV SOLN
750.0000 mg | INTRAVENOUS | Status: DC
  Filled 2014-12-11: qty 750

## 2014-12-11 MED ORDER — POTASSIUM CHLORIDE 2 MEQ/ML IV SOLN
80.0000 meq | INTRAVENOUS | Status: DC
Start: 2014-12-12 — End: 2014-12-12
  Filled 2014-12-11: qty 40

## 2014-12-11 MED ORDER — SODIUM CHLORIDE 0.9 % IV SOLN
INTRAVENOUS | Status: DC
  Filled 2014-12-11: qty 30

## 2014-12-11 MED ORDER — SODIUM CHLORIDE 0.9 % IV SOLN
INTRAVENOUS | Status: AC
  Administered 2014-12-12: 4.1 [IU]/h via INTRAVENOUS
  Filled 2014-12-11: qty 2.5

## 2014-12-11 MED ORDER — MAGNESIUM SULFATE 50 % IJ SOLN
40.0000 meq | INTRAMUSCULAR | Status: DC
Start: 2014-12-12 — End: 2014-12-12
  Filled 2014-12-11: qty 10

## 2014-12-11 MED ORDER — SODIUM CHLORIDE 0.9 % IV SOLN
INTRAVENOUS | Status: DC
  Filled 2014-12-11: qty 40

## 2014-12-11 MED ORDER — DOPAMINE-DEXTROSE 3.2-5 MG/ML-% IV SOLN
0.0000 ug/kg/min | INTRAVENOUS | Status: DC
  Filled 2014-12-11: qty 250

## 2014-12-11 MED ORDER — CHLORHEXIDINE GLUCONATE 4 % EX LIQD
60.0000 mL | Freq: Once | CUTANEOUS | Status: DC
  Filled 2014-12-11: qty 60

## 2014-12-11 MED ORDER — PLASMA-LYTE 148 IV SOLN
INTRAVENOUS | Status: DC
  Filled 2014-12-11: qty 2.5

## 2014-12-11 MED ORDER — DEXMEDETOMIDINE HCL IN NACL 400 MCG/100ML IV SOLN
0.1000 ug/kg/h | INTRAVENOUS | Status: DC
  Filled 2014-12-11: qty 100

## 2014-12-12 ENCOUNTER — Inpatient Hospital Stay (HOSPITAL_COMMUNITY): Payer: Medicaid Other

## 2014-12-12 ENCOUNTER — Inpatient Hospital Stay (HOSPITAL_COMMUNITY)
Admission: RE | Admit: 2014-12-12 | Discharge: 2014-12-16 | DRG: 266 | Disposition: A | Discharge status: Home / Self Care | Payer: Medicaid Other | Source: Ambulatory Visit | Attending: Cardiovascular Disease | Admitting: Cardiovascular Disease

## 2014-12-12 ENCOUNTER — Inpatient Hospital Stay (HOSPITAL_COMMUNITY): Payer: Medicaid Other | Admitting: Certified Registered"

## 2014-12-12 ENCOUNTER — Encounter (HOSPITAL_COMMUNITY): Payer: Self-pay | Admitting: *Deleted

## 2014-12-12 ENCOUNTER — Inpatient Hospital Stay (HOSPITAL_COMMUNITY): Payer: Medicaid Other | Admitting: Vascular Surgery

## 2014-12-12 ENCOUNTER — Encounter (HOSPITAL_COMMUNITY)
Admission: RE | Disposition: A | Discharge status: Home / Self Care | Payer: Medicaid Other | Source: Ambulatory Visit | Attending: Cardiovascular Disease

## 2014-12-12 DIAGNOSIS — I252 Old myocardial infarction: Secondary | ICD-10-CM | POA: Diagnosis not present

## 2014-12-12 DIAGNOSIS — I350 Nonrheumatic aortic (valve) stenosis: Secondary | ICD-10-CM | POA: Diagnosis not present

## 2014-12-12 DIAGNOSIS — Z825 Family history of asthma and other chronic lower respiratory diseases: Secondary | ICD-10-CM | POA: Diagnosis not present

## 2014-12-12 DIAGNOSIS — I2510 Atherosclerotic heart disease of native coronary artery without angina pectoris: Secondary | ICD-10-CM | POA: Diagnosis present

## 2014-12-12 DIAGNOSIS — Z8249 Family history of ischemic heart disease and other diseases of the circulatory system: Secondary | ICD-10-CM | POA: Diagnosis not present

## 2014-12-12 DIAGNOSIS — G2581 Restless legs syndrome: Secondary | ICD-10-CM | POA: Diagnosis present

## 2014-12-12 DIAGNOSIS — Z953 Presence of xenogenic heart valve: Secondary | ICD-10-CM

## 2014-12-12 DIAGNOSIS — D696 Thrombocytopenia, unspecified: Secondary | ICD-10-CM | POA: Diagnosis present

## 2014-12-12 DIAGNOSIS — Z803 Family history of malignant neoplasm of breast: Secondary | ICD-10-CM

## 2014-12-12 DIAGNOSIS — Z87891 Personal history of nicotine dependence: Secondary | ICD-10-CM

## 2014-12-12 DIAGNOSIS — Z9981 Dependence on supplemental oxygen: Secondary | ICD-10-CM

## 2014-12-12 DIAGNOSIS — Z9049 Acquired absence of other specified parts of digestive tract: Secondary | ICD-10-CM | POA: Diagnosis present

## 2014-12-12 DIAGNOSIS — I10 Essential (primary) hypertension: Secondary | ICD-10-CM | POA: Diagnosis present

## 2014-12-12 DIAGNOSIS — F329 Major depressive disorder, single episode, unspecified: Secondary | ICD-10-CM | POA: Diagnosis present

## 2014-12-12 DIAGNOSIS — E119 Type 2 diabetes mellitus without complications: Secondary | ICD-10-CM | POA: Diagnosis present

## 2014-12-12 DIAGNOSIS — Z7902 Long term (current) use of antithrombotics/antiplatelets: Secondary | ICD-10-CM | POA: Diagnosis not present

## 2014-12-12 DIAGNOSIS — Z86711 Personal history of pulmonary embolism: Secondary | ICD-10-CM | POA: Diagnosis not present

## 2014-12-12 DIAGNOSIS — E785 Hyperlipidemia, unspecified: Secondary | ICD-10-CM | POA: Diagnosis present

## 2014-12-12 DIAGNOSIS — D62 Acute posthemorrhagic anemia: Secondary | ICD-10-CM | POA: Diagnosis not present

## 2014-12-12 DIAGNOSIS — Z955 Presence of coronary angioplasty implant and graft: Secondary | ICD-10-CM

## 2014-12-12 DIAGNOSIS — Z7982 Long term (current) use of aspirin: Secondary | ICD-10-CM | POA: Diagnosis not present

## 2014-12-12 DIAGNOSIS — Z808 Family history of malignant neoplasm of other organs or systems: Secondary | ICD-10-CM

## 2014-12-12 DIAGNOSIS — J449 Chronic obstructive pulmonary disease, unspecified: Secondary | ICD-10-CM | POA: Diagnosis present

## 2014-12-12 DIAGNOSIS — Z6841 Body Mass Index (BMI) 40.0 and over, adult: Secondary | ICD-10-CM

## 2014-12-12 DIAGNOSIS — J45909 Unspecified asthma, uncomplicated: Secondary | ICD-10-CM | POA: Diagnosis present

## 2014-12-12 DIAGNOSIS — M1990 Unspecified osteoarthritis, unspecified site: Secondary | ICD-10-CM | POA: Diagnosis present

## 2014-12-12 DIAGNOSIS — I5033 Acute on chronic diastolic (congestive) heart failure: Secondary | ICD-10-CM | POA: Diagnosis present

## 2014-12-12 DIAGNOSIS — K219 Gastro-esophageal reflux disease without esophagitis: Secondary | ICD-10-CM | POA: Diagnosis present

## 2014-12-12 DIAGNOSIS — G4733 Obstructive sleep apnea (adult) (pediatric): Secondary | ICD-10-CM | POA: Diagnosis present

## 2014-12-12 DIAGNOSIS — Z96653 Presence of artificial knee joint, bilateral: Secondary | ICD-10-CM | POA: Diagnosis present

## 2014-12-12 DIAGNOSIS — Z006 Encounter for examination for normal comparison and control in clinical research program: Secondary | ICD-10-CM

## 2014-12-12 DIAGNOSIS — E1159 Type 2 diabetes mellitus with other circulatory complications: Secondary | ICD-10-CM | POA: Insufficient documentation

## 2014-12-12 DIAGNOSIS — L309 Dermatitis, unspecified: Secondary | ICD-10-CM | POA: Diagnosis present

## 2014-12-12 DIAGNOSIS — I152 Hypertension secondary to endocrine disorders: Secondary | ICD-10-CM | POA: Insufficient documentation

## 2014-12-12 HISTORY — PX: INTRAOPERATIVE TRANSESOPHAGEAL ECHOCARDIOGRAM: SHX5062

## 2014-12-12 HISTORY — PX: TRANSCATHETER AORTIC VALVE REPLACEMENT, TRANSFEMORAL: SHX6400

## 2014-12-12 LAB — CBC
HCT: 34.2 % — ABNORMAL LOW (ref 36.0–46.0)
Hemoglobin: 10.7 g/dL — ABNORMAL LOW (ref 12.0–15.0)
MCH: 25.7 pg — ABNORMAL LOW (ref 26.0–34.0)
MCHC: 31.3 g/dL (ref 30.0–36.0)
MCV: 82.2 fL (ref 78.0–100.0)
PLATELETS: 162 10*3/uL (ref 150–400)
RBC: 4.16 MIL/uL (ref 3.87–5.11)
RDW: 14.3 % (ref 11.5–15.5)
WBC: 9.9 10*3/uL (ref 4.0–10.5)

## 2014-12-12 LAB — GLUCOSE, CAPILLARY
GLUCOSE-CAPILLARY: 111 mg/dL — AB (ref 70–99)
GLUCOSE-CAPILLARY: 146 mg/dL — AB (ref 70–99)
GLUCOSE-CAPILLARY: 109 mg/dL — AB (ref 70–99)
GLUCOSE-CAPILLARY: 132 mg/dL — AB (ref 70–99)
GLUCOSE-CAPILLARY: 120 mg/dL — AB (ref 70–99)
Glucose-Capillary: 142 mg/dL — ABNORMAL HIGH (ref 70–99)
Glucose-Capillary: 136 mg/dL — ABNORMAL HIGH (ref 70–99)
Glucose-Capillary: 123 mg/dL — ABNORMAL HIGH (ref 70–99)
Glucose-Capillary: 115 mg/dL — ABNORMAL HIGH (ref 70–99)
Glucose-Capillary: 97 mg/dL (ref 70–99)

## 2014-12-12 LAB — POCT I-STAT, CHEM 8
BUN: 14 mg/dL (ref 6–23)
BUN: 14 mg/dL (ref 6–23)
BUN: 14 mg/dL (ref 6–23)
CHLORIDE: 96 meq/L (ref 96–112)
Calcium, Ion: 1.19 mmol/L (ref 1.12–1.23)
Calcium, Ion: 1.17 mmol/L (ref 1.12–1.23)
Calcium, Ion: 1.16 mmol/L (ref 1.12–1.23)
Chloride: 96 mEq/L (ref 96–112)
Chloride: 95 mEq/L — ABNORMAL LOW (ref 96–112)
Creatinine, Ser: 0.9 mg/dL (ref 0.50–1.10)
Creatinine, Ser: 0.7 mg/dL (ref 0.50–1.10)
Creatinine, Ser: 0.7 mg/dL (ref 0.50–1.10)
Glucose, Bld: 170 mg/dL — ABNORMAL HIGH (ref 70–99)
Glucose, Bld: 197 mg/dL — ABNORMAL HIGH (ref 70–99)
Glucose, Bld: 202 mg/dL — ABNORMAL HIGH (ref 70–99)
HEMATOCRIT: 36 % (ref 36.0–46.0)
HEMATOCRIT: 33 % — AB (ref 36.0–46.0)
HEMATOCRIT: 32 % — AB (ref 36.0–46.0)
HEMOGLOBIN: 11.2 g/dL — AB (ref 12.0–15.0)
HEMOGLOBIN: 10.9 g/dL — AB (ref 12.0–15.0)
Hemoglobin: 12.2 g/dL (ref 12.0–15.0)
POTASSIUM: 4.7 meq/L (ref 3.7–5.3)
POTASSIUM: 4.5 meq/L (ref 3.7–5.3)
Potassium: 4.6 mEq/L (ref 3.7–5.3)
SODIUM: 138 meq/L (ref 137–147)
SODIUM: 138 meq/L (ref 137–147)
Sodium: 136 mEq/L — ABNORMAL LOW (ref 137–147)
TCO2: 31 mmol/L (ref 0–100)
TCO2: 30 mmol/L (ref 0–100)
TCO2: 31 mmol/L (ref 0–100)

## 2014-12-12 LAB — POCT I-STAT 3, ART BLOOD GAS (G3+)
Acid-Base Excess: 4 mmol/L — ABNORMAL HIGH (ref 0.0–2.0)
Acid-Base Excess: 5 mmol/L — ABNORMAL HIGH (ref 0.0–2.0)
Bicarbonate: 32.6 mEq/L — ABNORMAL HIGH (ref 20.0–24.0)
Bicarbonate: 33 mEq/L — ABNORMAL HIGH (ref 20.0–24.0)
O2 Saturation: 92 %
O2 Saturation: 93 %
PCO2 ART: 69.5 mmHg — AB (ref 35.0–45.0)
PCO2 ART: 67.8 mmHg — AB (ref 35.0–45.0)
PH ART: 7.277 — AB (ref 7.350–7.450)
PH ART: 7.293 — AB (ref 7.350–7.450)
PO2 ART: 72 mmHg — AB (ref 80.0–100.0)
Patient temperature: 36.4
Patient temperature: 36.5
TCO2: 35 mmol/L (ref 0–100)
TCO2: 35 mmol/L (ref 0–100)
pO2, Arterial: 74 mmHg — ABNORMAL LOW (ref 80.0–100.0)

## 2014-12-12 LAB — PROTIME-INR
INR: 1.18 (ref 0.00–1.49)
Prothrombin Time: 15.1 seconds (ref 11.6–15.2)

## 2014-12-12 LAB — PREPARE RBC (CROSSMATCH)

## 2014-12-12 LAB — APTT: aPTT: 29 seconds (ref 24–37)

## 2014-12-12 LAB — POCT I-STAT 4, (NA,K, GLUC, HGB,HCT)
Glucose, Bld: 199 mg/dL — ABNORMAL HIGH (ref 70–99)
HEMATOCRIT: 34 % — AB (ref 36.0–46.0)
HEMOGLOBIN: 11.6 g/dL — AB (ref 12.0–15.0)
Potassium: 4 mEq/L (ref 3.7–5.3)
SODIUM: 137 meq/L (ref 137–147)

## 2014-12-12 IMAGING — CR DG CHEST 1V PORT
1 series · 1 of 1 positions shown · non-contrast
Comparison: None.

CLINICAL DATA: Postop TAVR

EXAM:
PORTABLE CHEST - 1 VIEW

[AP]
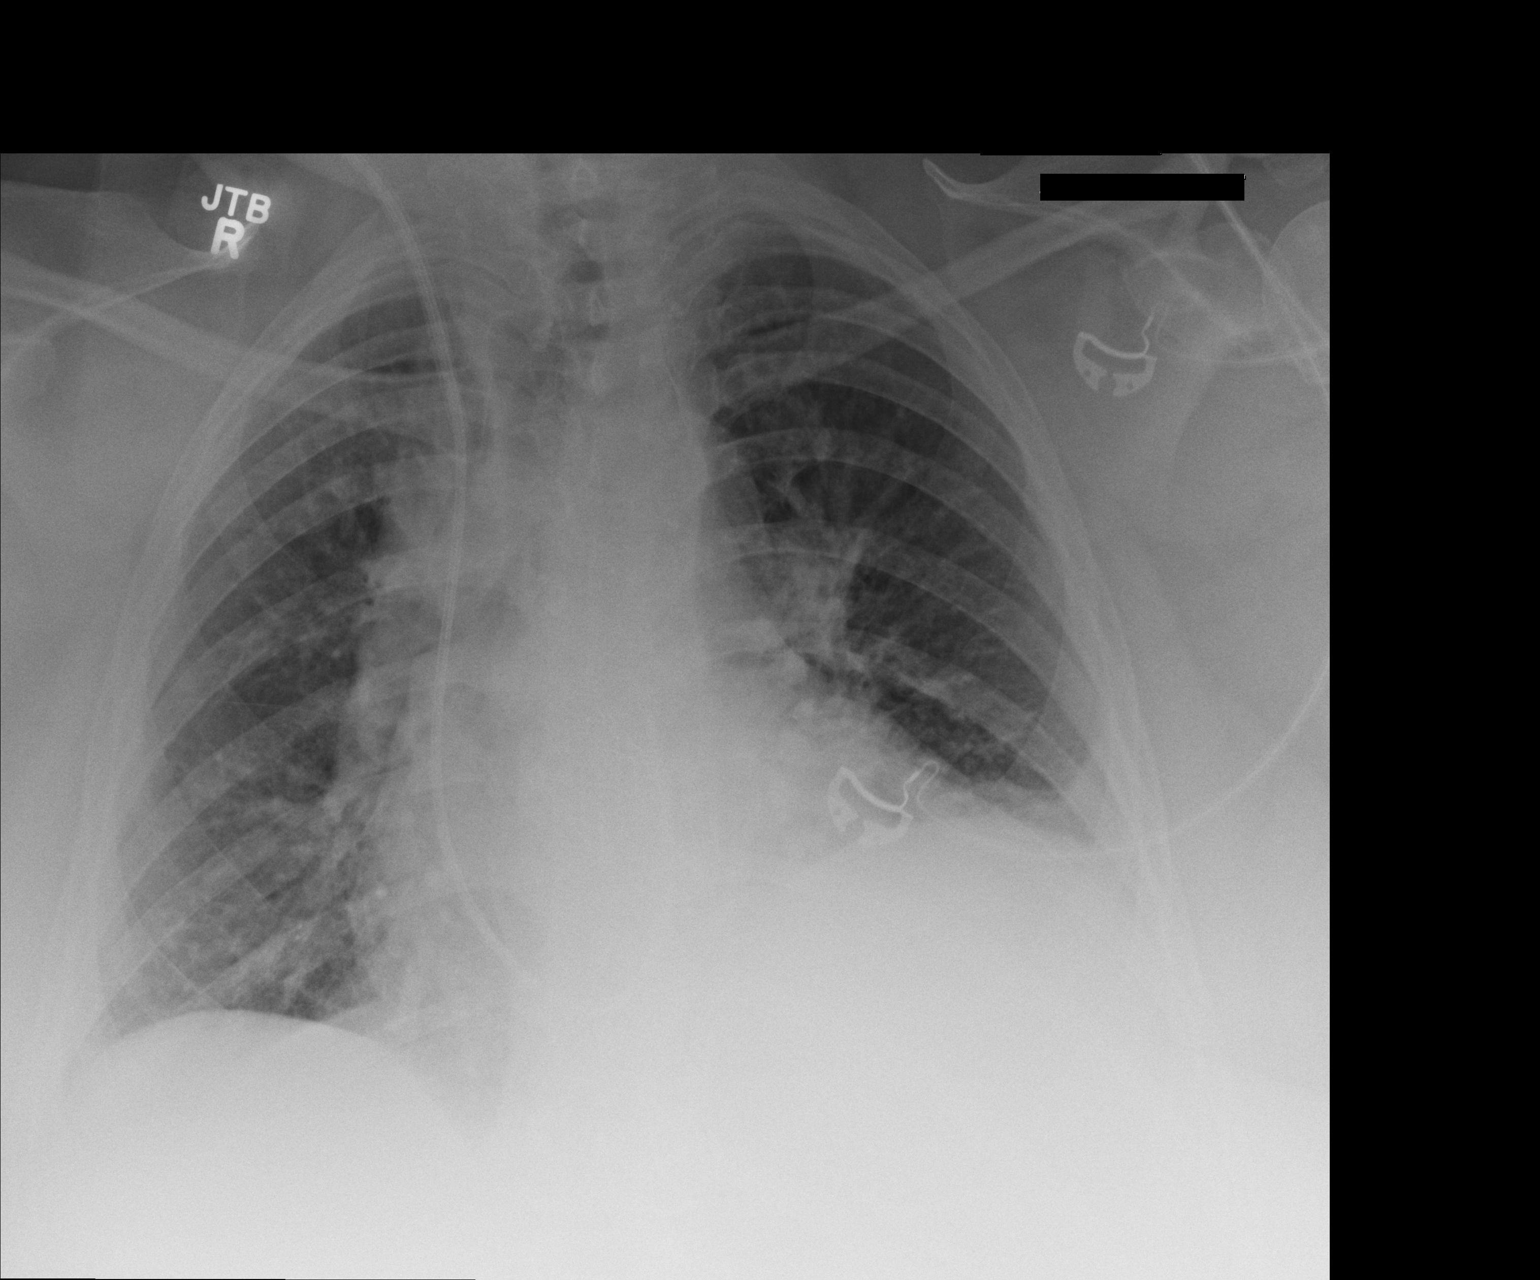

[1 of 1 positions shown; findings below may reference images not displayed]

FINDINGS: Swan-Ganz catheter with the tip at the right ventricular outflow
tract. Mild bilateral pulmonary vascular prominence. No significant
pleural effusion or pneumothorax. Elevation of the left diaphragm.
Stable cardiomediastinal silhouette. No acute osseous abnormality.
IMPRESSION: 1. Swan-Ganz catheter with the tip at the right ventricular outflow
tract.
2. Mild pulmonary vascular congestion.

## 2014-12-12 SURGERY — IMPLANTATION, AORTIC VALVE, TRANSCATHETER, FEMORAL APPROACH
Anesthesia: General

## 2014-12-12 MED ORDER — CLOPIDOGREL BISULFATE 75 MG PO TABS
75.0000 mg | ORAL_TABLET | Freq: Every day | ORAL | Status: DC
  Administered 2014-12-13 – 2014-12-16 (×4): 75 mg via ORAL
  Filled 2014-12-12 (×4): qty 1

## 2014-12-12 MED ORDER — ACETAMINOPHEN 160 MG/5ML PO SOLN
650.0000 mg | Freq: Once | ORAL | Status: AC
  Administered 2014-12-14: 650 mg

## 2014-12-12 MED ORDER — LIDOCAINE HCL (CARDIAC) 20 MG/ML IV SOLN
INTRAVENOUS | Status: AC
  Filled 2014-12-12: qty 5

## 2014-12-12 MED ORDER — PANTOPRAZOLE SODIUM 40 MG PO TBEC
40.0000 mg | DELAYED_RELEASE_TABLET | Freq: Every day | ORAL | Status: DC
  Administered 2014-12-13 – 2014-12-16 (×4): 40 mg via ORAL
  Filled 2014-12-12 (×4): qty 1

## 2014-12-12 MED ORDER — NEOSTIGMINE METHYLSULFATE 10 MG/10ML IV SOLN
INTRAVENOUS | Status: DC | PRN
  Administered 2014-12-12: 5 mg via INTRAVENOUS

## 2014-12-12 MED ORDER — FAMOTIDINE IN NACL 20-0.9 MG/50ML-% IV SOLN
20.0000 mg | Freq: Two times a day (BID) | INTRAVENOUS | Status: AC
  Administered 2014-12-12 (×2): 20 mg via INTRAVENOUS
  Filled 2014-12-12 (×2): qty 50

## 2014-12-12 MED ORDER — ROCURONIUM BROMIDE 50 MG/5ML IV SOLN
INTRAVENOUS | Status: AC
  Filled 2014-12-12: qty 1

## 2014-12-12 MED ORDER — METOPROLOL TARTRATE 25 MG PO TABS
25.0000 mg | ORAL_TABLET | Freq: Two times a day (BID) | ORAL | Status: DC
  Administered 2014-12-12 – 2014-12-16 (×8): 25 mg via ORAL
  Filled 2014-12-12 (×9): qty 1

## 2014-12-12 MED ORDER — OXYCODONE HCL 5 MG PO TABS
5.0000 mg | ORAL_TABLET | ORAL | Status: DC | PRN
Start: 2014-12-12 — End: 2014-12-16

## 2014-12-12 MED ORDER — FUROSEMIDE 40 MG PO TABS
40.0000 mg | ORAL_TABLET | Freq: Two times a day (BID) | ORAL | Status: DC
Start: 2014-12-12 — End: 2014-12-16
  Administered 2014-12-12 – 2014-12-16 (×8): 40 mg via ORAL
  Filled 2014-12-12 (×10): qty 1

## 2014-12-12 MED ORDER — METOPROLOL TARTRATE 25 MG/10 ML ORAL SUSPENSION
12.5000 mg | Freq: Two times a day (BID) | ORAL | Status: DC
  Filled 2014-12-12: qty 5

## 2014-12-12 MED ORDER — HEPARIN SODIUM (PORCINE) 1000 UNIT/ML IJ SOLN
INTRAMUSCULAR | Status: DC | PRN
  Administered 2014-12-12: 20000 [IU] via INTRAVENOUS

## 2014-12-12 MED ORDER — MORPHINE SULFATE 2 MG/ML IJ SOLN
1.0000 mg | INTRAMUSCULAR | Status: AC | PRN
  Administered 2014-12-12 (×2): 2 mg via INTRAVENOUS

## 2014-12-12 MED ORDER — MIDAZOLAM HCL 2 MG/2ML IJ SOLN: 2.0000 mg | INTRAMUSCULAR | Status: DC | PRN

## 2014-12-12 MED ORDER — MIDAZOLAM HCL 2 MG/2ML IJ SOLN
INTRAMUSCULAR | Status: AC
  Filled 2014-12-12: qty 2

## 2014-12-12 MED ORDER — ACETAMINOPHEN 650 MG RE SUPP: 650.0000 mg | Freq: Once | RECTAL | Status: AC

## 2014-12-12 MED ORDER — IODIXANOL 320 MG/ML IV SOLN
INTRAVENOUS | Status: DC | PRN
  Administered 2014-12-12: 115.4 mL via INTRAVENOUS

## 2014-12-12 MED ORDER — FENTANYL CITRATE 0.05 MG/ML IJ SOLN
INTRAMUSCULAR | Status: AC
  Filled 2014-12-12: qty 5

## 2014-12-12 MED ORDER — ONDANSETRON HCL 4 MG/2ML IJ SOLN
INTRAMUSCULAR | Status: AC
Start: 2014-12-12 — End: 2014-12-12
  Filled 2014-12-12: qty 2

## 2014-12-12 MED ORDER — CEFUROXIME SODIUM 1.5 G IJ SOLR
1.5000 g | Freq: Two times a day (BID) | INTRAMUSCULAR | Status: AC
  Administered 2014-12-12 – 2014-12-14 (×4): 1.5 g via INTRAVENOUS
  Filled 2014-12-12 (×4): qty 1.5

## 2014-12-12 MED ORDER — SODIUM CHLORIDE 0.9 % IV SOLN
INTRAVENOUS | Status: DC
  Filled 2014-12-12: qty 2.5

## 2014-12-12 MED ORDER — LACTATED RINGERS IV SOLN: 500.0000 mL | Freq: Once | INTRAVENOUS | Status: AC | PRN

## 2014-12-12 MED ORDER — PROPOFOL 10 MG/ML IV BOLUS
INTRAVENOUS | Status: AC
  Filled 2014-12-12: qty 20

## 2014-12-12 MED ORDER — LOSARTAN POTASSIUM 50 MG PO TABS
100.0000 mg | ORAL_TABLET | Freq: Every day | ORAL | Status: DC
  Administered 2014-12-12 – 2014-12-14 (×3): 100 mg via ORAL
  Filled 2014-12-12 (×4): qty 2

## 2014-12-12 MED ORDER — GLYCOPYRROLATE 0.2 MG/ML IJ SOLN
INTRAMUSCULAR | Status: DC | PRN
  Administered 2014-12-12: .8 mg via INTRAVENOUS

## 2014-12-12 MED ORDER — PROPOFOL 10 MG/ML IV BOLUS
INTRAVENOUS | Status: DC | PRN
  Administered 2014-12-12: 80 mg via INTRAVENOUS

## 2014-12-12 MED ORDER — SODIUM CHLORIDE 0.9 % IV SOLN
INTRAVENOUS | Status: AC
  Administered 2014-12-12: 12:00:00 via INTRAVENOUS

## 2014-12-12 MED ORDER — ARTIFICIAL TEARS OP OINT
TOPICAL_OINTMENT | OPHTHALMIC | Status: DC | PRN
  Administered 2014-12-12: 1 via OPHTHALMIC

## 2014-12-12 MED ORDER — SODIUM CHLORIDE 0.9 % IR SOLN
Status: DC | PRN
  Administered 2014-12-12: 500 mL

## 2014-12-12 MED ORDER — VECURONIUM BROMIDE 10 MG IV SOLR
INTRAVENOUS | Status: DC | PRN
  Administered 2014-12-12 (×2): 2 mg via INTRAVENOUS
  Administered 2014-12-12: 1 mg via INTRAVENOUS

## 2014-12-12 MED ORDER — MORPHINE SULFATE 2 MG/ML IJ SOLN
2.0000 mg | INTRAMUSCULAR | Status: DC | PRN
  Filled 2014-12-12: qty 2
  Filled 2014-12-12: qty 1

## 2014-12-12 MED ORDER — FENTANYL CITRATE 0.05 MG/ML IJ SOLN
INTRAMUSCULAR | Status: DC | PRN
  Administered 2014-12-12: 100 ug via INTRAVENOUS
  Administered 2014-12-12: 50 ug via INTRAVENOUS
  Administered 2014-12-12: 100 ug via INTRAVENOUS

## 2014-12-12 MED ORDER — METOPROLOL TARTRATE 1 MG/ML IV SOLN: 2.5000 mg | INTRAVENOUS | Status: DC | PRN

## 2014-12-12 MED ORDER — ROCURONIUM BROMIDE 100 MG/10ML IV SOLN
INTRAVENOUS | Status: DC | PRN
  Administered 2014-12-12: 50 mg via INTRAVENOUS

## 2014-12-12 MED ORDER — ACETAMINOPHEN 160 MG/5ML PO SOLN
1000.0000 mg | Freq: Four times a day (QID) | ORAL | Status: DC
Start: 2014-12-12 — End: 2014-12-16
  Filled 2014-12-12: qty 40

## 2014-12-12 MED ORDER — GABAPENTIN 300 MG PO CAPS
300.0000 mg | ORAL_CAPSULE | Freq: Two times a day (BID) | ORAL | Status: DC
  Administered 2014-12-13 – 2014-12-16 (×7): 300 mg via ORAL
  Filled 2014-12-12 (×9): qty 1

## 2014-12-12 MED ORDER — CITALOPRAM HYDROBROMIDE 20 MG PO TABS
20.0000 mg | ORAL_TABLET | Freq: Every day | ORAL | Status: DC
  Administered 2014-12-13 – 2014-12-16 (×4): 20 mg via ORAL
  Filled 2014-12-12 (×4): qty 1

## 2014-12-12 MED ORDER — ARTIFICIAL TEARS OP OINT
TOPICAL_OINTMENT | OPHTHALMIC | Status: AC
  Filled 2014-12-12: qty 3.5

## 2014-12-12 MED ORDER — SUCCINYLCHOLINE CHLORIDE 20 MG/ML IJ SOLN
INTRAMUSCULAR | Status: DC | PRN
  Administered 2014-12-12 (×2): 100 mg via INTRAVENOUS

## 2014-12-12 MED ORDER — VANCOMYCIN HCL IN DEXTROSE 1-5 GM/200ML-% IV SOLN
1000.0000 mg | Freq: Once | INTRAVENOUS | Status: AC
Start: 2014-12-12 — End: 2014-12-12
  Administered 2014-12-12: 1000 mg via INTRAVENOUS
  Filled 2014-12-12: qty 200

## 2014-12-12 MED ORDER — PHENYLEPHRINE HCL 10 MG/ML IJ SOLN
INTRAMUSCULAR | Status: DC | PRN
  Administered 2014-12-12: 120 ug via INTRAVENOUS

## 2014-12-12 MED ORDER — ASPIRIN 81 MG PO CHEW: 324.0000 mg | CHEWABLE_TABLET | Freq: Every day | ORAL | Status: DC

## 2014-12-12 MED ORDER — PROTAMINE SULFATE 10 MG/ML IV SOLN
INTRAVENOUS | Status: DC | PRN
  Administered 2014-12-12 (×3): 50 mg via INTRAVENOUS
  Administered 2014-12-12 (×2): 25 mg via INTRAVENOUS

## 2014-12-12 MED ORDER — ALBUTEROL SULFATE (2.5 MG/3ML) 0.083% IN NEBU
2.5000 mg | INHALATION_SOLUTION | RESPIRATORY_TRACT | Status: DC | PRN
  Administered 2014-12-13: 2.5 mg via RESPIRATORY_TRACT
  Filled 2014-12-12: qty 3

## 2014-12-12 MED ORDER — ALBUTEROL SULFATE HFA 108 (90 BASE) MCG/ACT IN AERS: 2.0000 | INHALATION_SPRAY | RESPIRATORY_TRACT | Status: DC | PRN

## 2014-12-12 MED ORDER — EZETIMIBE 10 MG PO TABS
10.0000 mg | ORAL_TABLET | Freq: Every day | ORAL | Status: DC
  Administered 2014-12-12 – 2014-12-16 (×5): 10 mg via ORAL
  Filled 2014-12-12 (×5): qty 1

## 2014-12-12 MED ORDER — LACTATED RINGERS IV SOLN
INTRAVENOUS | Status: DC | PRN
  Administered 2014-12-12 (×2): via INTRAVENOUS

## 2014-12-12 MED ORDER — ATORVASTATIN CALCIUM 80 MG PO TABS
80.0000 mg | ORAL_TABLET | Freq: Every day | ORAL | Status: DC
  Administered 2014-12-12 – 2014-12-15 (×4): 80 mg via ORAL
  Filled 2014-12-12 (×5): qty 1

## 2014-12-12 MED ORDER — TRAMADOL HCL 50 MG PO TABS
50.0000 mg | ORAL_TABLET | ORAL | Status: DC | PRN
  Administered 2014-12-15: 50 mg via ORAL
  Filled 2014-12-12: qty 1

## 2014-12-12 MED ORDER — NITROGLYCERIN IN D5W 200-5 MCG/ML-% IV SOLN
0.0000 ug/min | INTRAVENOUS | Status: DC
  Administered 2014-12-12: 90 ug/min via INTRAVENOUS
  Filled 2014-12-12: qty 250

## 2014-12-12 MED ORDER — PHENYLEPHRINE HCL 10 MG/ML IJ SOLN
0.0000 ug/min | INTRAVENOUS | Status: DC
  Filled 2014-12-12: qty 2

## 2014-12-12 MED ORDER — ASPIRIN EC 81 MG PO TBEC
81.0000 mg | DELAYED_RELEASE_TABLET | Freq: Every day | ORAL | Status: DC
  Administered 2014-12-13 – 2014-12-16 (×4): 81 mg via ORAL
  Filled 2014-12-12 (×4): qty 1

## 2014-12-12 MED ORDER — NOREPINEPHRINE BITARTRATE 1 MG/ML IV SOLN
0.0000 ug/min | INTRAVENOUS | Status: DC
  Filled 2014-12-12: qty 4

## 2014-12-12 MED ORDER — ONDANSETRON HCL 4 MG/2ML IJ SOLN
4.0000 mg | Freq: Four times a day (QID) | INTRAMUSCULAR | Status: DC | PRN
  Administered 2014-12-12: 4 mg via INTRAVENOUS
  Filled 2014-12-12: qty 2

## 2014-12-12 MED ORDER — FENTANYL CITRATE 0.05 MG/ML IJ SOLN: 25.0000 ug | INTRAMUSCULAR | Status: DC | PRN

## 2014-12-12 MED ORDER — ALBUMIN HUMAN 5 % IV SOLN: 250.0000 mL | INTRAVENOUS | Status: AC | PRN

## 2014-12-12 MED ORDER — INSULIN REGULAR BOLUS VIA INFUSION
0.0000 [IU] | Freq: Three times a day (TID) | INTRAVENOUS | Status: DC
  Administered 2014-12-13: 1.3 [IU] via INTRAVENOUS
  Administered 2014-12-13: 4.6 [IU] via INTRAVENOUS
  Administered 2014-12-13: 1.6 [IU] via INTRAVENOUS
  Administered 2014-12-13: 4.3 [IU] via INTRAVENOUS
  Administered 2014-12-13: 2.2 [IU] via INTRAVENOUS
  Filled 2014-12-12: qty 10

## 2014-12-12 MED ORDER — ONDANSETRON HCL 4 MG/2ML IJ SOLN
INTRAMUSCULAR | Status: DC | PRN
  Administered 2014-12-12: 4 mg via INTRAVENOUS

## 2014-12-12 MED ORDER — MIDAZOLAM HCL 5 MG/5ML IJ SOLN
INTRAMUSCULAR | Status: DC | PRN
  Administered 2014-12-12 (×2): 1 mg via INTRAVENOUS

## 2014-12-12 MED ORDER — METOPROLOL TARTRATE 12.5 MG HALF TABLET
12.5000 mg | ORAL_TABLET | Freq: Two times a day (BID) | ORAL | Status: DC
  Filled 2014-12-12: qty 1

## 2014-12-12 MED ORDER — BUDESONIDE-FORMOTEROL FUMARATE 160-4.5 MCG/ACT IN AERO
2.0000 | INHALATION_SPRAY | Freq: Two times a day (BID) | RESPIRATORY_TRACT | Status: DC
  Administered 2014-12-12 – 2014-12-16 (×8): 2 via RESPIRATORY_TRACT
  Filled 2014-12-12 (×2): qty 6

## 2014-12-12 MED ORDER — DEXMEDETOMIDINE HCL IN NACL 200 MCG/50ML IV SOLN: 0.1000 ug/kg/h | INTRAVENOUS | Status: DC

## 2014-12-12 MED ORDER — ACETAMINOPHEN 500 MG PO TABS
1000.0000 mg | ORAL_TABLET | Freq: Four times a day (QID) | ORAL | Status: DC
  Administered 2014-12-12 – 2014-12-16 (×14): 1000 mg via ORAL
  Filled 2014-12-12 (×19): qty 2

## 2014-12-12 SURGICAL SUPPLY — 120 items
ADAPTER CARDIOPLEGIA (MISCELLANEOUS) IMPLANT
ANTEGRADE CPLG (MISCELLANEOUS) IMPLANT
ATTRACTOMAT 16X20 MAGNETIC DRP (DRAPES) IMPLANT
BAG BANDED W/RUBBER/TAPE 36X54 (MISCELLANEOUS) ×2 IMPLANT
BAG DECANTER FOR FLEXI CONT (MISCELLANEOUS) IMPLANT
BAG SNAP BAND KOVER 36X36 (MISCELLANEOUS) ×2 IMPLANT
BLADE 10 SAFETY STRL DISP (BLADE) ×2 IMPLANT
BLADE STERNUM SYSTEM 6 (BLADE) ×2 IMPLANT
BLADE SURG ROTATE 9660 (MISCELLANEOUS) ×2 IMPLANT
CABLE PACING FASLOC BIEGE (MISCELLANEOUS) ×2 IMPLANT
CABLE PACING FASLOC BLUE (MISCELLANEOUS) ×4 IMPLANT
CANISTER SUCTION 2500CC (MISCELLANEOUS) ×2 IMPLANT
CANNULA FEM VENOUS REMOTE 22FR (CANNULA) IMPLANT
CANNULA FEMORAL ART 14 SM (MISCELLANEOUS) IMPLANT
CANNULA GUNDRY RCSP 15FR (MISCELLANEOUS) IMPLANT
CANNULA OPTISITE PERFUSION 16F (CANNULA) IMPLANT
CANNULA OPTISITE PERFUSION 18F (CANNULA) IMPLANT
CANNULA SOFTFLOW AORTIC 7M21FR (CANNULA) IMPLANT
CANNULA VENOUS LOW PROF 34X46 (CANNULA) IMPLANT
CATH DIAG EXPO 6F AL2 (CATHETERS) ×2 IMPLANT
CATH DIAG EXPO 6F VENT PIG 145 (CATHETERS) ×4 IMPLANT
CATH HEART VENT LEFT (CATHETERS) IMPLANT
CATH S G BIP PACING (SET/KITS/TRAYS/PACK) ×4 IMPLANT
CATH SOFT-VU ST 4F 90CM (CATHETERS) ×2 IMPLANT
CLIP TI MEDIUM 24 (CLIP) ×2 IMPLANT
CLIP TI WIDE RED SMALL 24 (CLIP) ×2 IMPLANT
CONN ST 1/4X3/8  BEN (MISCELLANEOUS)
CONN ST 1/4X3/8 BEN (MISCELLANEOUS) IMPLANT
CONNECTOR 1/2X3/8X1/2 3 WAY (MISCELLANEOUS)
CONNECTOR 1/2X3/8X1/2 3WAY (MISCELLANEOUS) IMPLANT
CONT SPEC 4OZ CLIKSEAL STRL BL (MISCELLANEOUS) ×4 IMPLANT
COVER DOME SNAP 22 D (MISCELLANEOUS) ×2 IMPLANT
COVER MAYO STAND STRL (DRAPES) ×2 IMPLANT
COVER PROBE W GEL 5X96 (DRAPES) IMPLANT
COVER SURGICAL LIGHT HANDLE (MISCELLANEOUS) ×2 IMPLANT
COVER TABLE BACK 60X90 (DRAPES) ×2 IMPLANT
CRADLE DONUT ADULT HEAD (MISCELLANEOUS) ×2 IMPLANT
DERMABOND ADHESIVE PROPEN (GAUZE/BANDAGES/DRESSINGS) ×1
DERMABOND ADVANCED (GAUZE/BANDAGES/DRESSINGS) ×1
DERMABOND ADVANCED .7 DNX12 (GAUZE/BANDAGES/DRESSINGS) ×1 IMPLANT
DERMABOND ADVANCED .7 DNX6 (GAUZE/BANDAGES/DRESSINGS) ×1 IMPLANT
DRAIN CHANNEL 28F RND 3/8 FF (WOUND CARE) IMPLANT
DRAIN CHANNEL 32F RND 10.7 FF (WOUND CARE) IMPLANT
DRAPE INCISE IOBAN 66X45 STRL (DRAPES) ×2 IMPLANT
DRAPE SLUSH/WARMER DISC (DRAPES) ×2 IMPLANT
DRAPE TABLE COVER HEAVY DUTY (DRAPES) ×2 IMPLANT
DRSG TEGADERM 4X4.75 (GAUZE/BANDAGES/DRESSINGS) ×2 IMPLANT
ELECT REM PT RETURN 9FT ADLT (ELECTROSURGICAL) ×4
ELECTRODE REM PT RTRN 9FT ADLT (ELECTROSURGICAL) ×2 IMPLANT
FELT TEFLON 6X6 (MISCELLANEOUS) ×2 IMPLANT
FEMORAL VENOUS CANN RAP (CANNULA) IMPLANT
GAUZE SPONGE 4X4 12PLY STRL (GAUZE/BANDAGES/DRESSINGS) ×2 IMPLANT
GLOVE ECLIPSE 7.5 STRL STRAW (GLOVE) ×2 IMPLANT
GLOVE ECLIPSE 8.0 STRL XLNG CF (GLOVE) ×4 IMPLANT
GLOVE EUDERMIC 7 POWDERFREE (GLOVE) ×2 IMPLANT
GLOVE ORTHO TXT STRL SZ7.5 (GLOVE) ×2 IMPLANT
GOWN STRL REUS W/ TWL LRG LVL3 (GOWN DISPOSABLE) ×3 IMPLANT
GOWN STRL REUS W/ TWL XL LVL3 (GOWN DISPOSABLE) ×6 IMPLANT
GOWN STRL REUS W/TWL LRG LVL3 (GOWN DISPOSABLE) ×3
GOWN STRL REUS W/TWL XL LVL3 (GOWN DISPOSABLE) ×6
GUIDEWIRE 3MM J .035 260CM (WIRE) ×2 IMPLANT
GUIDEWIRE SAF TJ AMPL .035X180 (WIRE) ×2 IMPLANT
GUIDEWIRE SAFE TJ AMPLATZ EXST (WIRE) ×2 IMPLANT
GUIDEWIRE STRAIGHT .035 260CM (WIRE) ×2 IMPLANT
HEMOSTAT POWDER SURGIFOAM 1G (HEMOSTASIS) IMPLANT
INSERT FOGARTY 61MM (MISCELLANEOUS) IMPLANT
INSERT FOGARTY SM (MISCELLANEOUS) ×4 IMPLANT
INSERT FOGARTY XLG (MISCELLANEOUS) IMPLANT
KIT BASIN OR (CUSTOM PROCEDURE TRAY) ×2 IMPLANT
KIT DILATOR VASC 18G NDL (KITS) IMPLANT
KIT HEART LEFT (KITS) ×2 IMPLANT
KIT ROOM TURNOVER OR (KITS) ×2 IMPLANT
KIT SUCTION CATH 14FR (SUCTIONS) ×4 IMPLANT
LEAD PACING MYOCARDI (MISCELLANEOUS) IMPLANT
NEEDLE PERC 18GX7CM (NEEDLE) ×2 IMPLANT
NS IRRIG 1000ML POUR BTL (IV SOLUTION) ×6 IMPLANT
PACK AORTA (CUSTOM PROCEDURE TRAY) ×2 IMPLANT
PAD ARMBOARD 7.5X6 YLW CONV (MISCELLANEOUS) ×4 IMPLANT
PAD ELECT DEFIB RADIOL ZOLL (MISCELLANEOUS) ×2 IMPLANT
PATCH TACHOSII LRG 9.5X4.8 (VASCULAR PRODUCTS) ×2 IMPLANT
SET CANNULATION TOURNIQUET (MISCELLANEOUS) IMPLANT
SHEATH PINNACLE 6F 10CM (SHEATH) ×4 IMPLANT
SPONGE LAP 4X18 X RAY DECT (DISPOSABLE) ×2 IMPLANT
STOPCOCK 4 WAY LG BORE MALE ST (IV SETS) ×4 IMPLANT
STOPCOCK MORSE 400PSI 3WAY (MISCELLANEOUS) ×6 IMPLANT
SUT ETHIBOND 2 0 SH (SUTURE) ×1
SUT ETHIBOND 2 0 SH 36X2 (SUTURE) ×1 IMPLANT
SUT ETHIBOND X763 2 0 SH 1 (SUTURE) ×4 IMPLANT
SUT MNCRL AB 3-0 PS2 18 (SUTURE) ×2 IMPLANT
SUT PDS AB 1 CTX 36 (SUTURE) IMPLANT
SUT PROLENE 2 0 MH 48 (SUTURE) IMPLANT
SUT PROLENE 3 0 SH1 36 (SUTURE) IMPLANT
SUT PROLENE 4 0 RB 1 (SUTURE)
SUT PROLENE 4-0 RB1 .5 CRCL 36 (SUTURE) IMPLANT
SUT PROLENE 5 0 C 1 36 (SUTURE) ×6 IMPLANT
SUT PROLENE 6 0 C 1 30 (SUTURE) ×6 IMPLANT
SUT SILK  1 MH (SUTURE) ×1
SUT SILK 1 MH (SUTURE) ×1 IMPLANT
SUT SILK 2 0 SH CR/8 (SUTURE) ×2 IMPLANT
SUT TEM PAC WIRE 2 0 SH (SUTURE) IMPLANT
SUT VIC AB 2-0 CTX 36 (SUTURE) ×2 IMPLANT
SUT VIC AB 3-0 SH 8-18 (SUTURE) ×4 IMPLANT
SUT VIC AB 3-0 X1 27 (SUTURE) ×2 IMPLANT
SYR 130ML INJECTION CT SYSTEM (MISCELLANEOUS) ×2 IMPLANT
SYR 30ML LL (SYRINGE) ×8 IMPLANT
SYR 50ML LL SCALE MARK (SYRINGE) ×2 IMPLANT
SYSTEM SAHARA CHEST DRAIN ATS (WOUND CARE) ×2 IMPLANT
TAPE CLOTH SURG 4X10 WHT LF (GAUZE/BANDAGES/DRESSINGS) ×2 IMPLANT
TOWEL OR 17X24 6PK STRL BLUE (TOWEL DISPOSABLE) ×6 IMPLANT
TOWEL OR 17X26 10 PK STRL BLUE (TOWEL DISPOSABLE) ×4 IMPLANT
TRANSDUCER W/STOPCOCK (MISCELLANEOUS) ×4 IMPLANT
TRAY FOLEY IC TEMP SENS 14FR (CATHETERS) ×2 IMPLANT
TUBE SUCT INTRACARD DLP 20F (MISCELLANEOUS) IMPLANT
TUBING ART PRESS 12 MALE/MALE (MISCELLANEOUS) ×2 IMPLANT
TUBING HIGH PRESSURE 120CM (CONNECTOR) IMPLANT
UNDERPAD 30X30 INCONTINENT (UNDERPADS AND DIAPERS) ×2 IMPLANT
VALVE HEART TRANSCATH SZ3 26MM (Prosthesis & Implant Heart) ×2 IMPLANT
VENT LEFT HEART 12002 (CATHETERS)
WATER STERILE IRR 1000ML POUR (IV SOLUTION) ×4 IMPLANT
WIRE AMPLATZ SS-J .035X180CM (WIRE) ×2 IMPLANT

## 2014-12-12 NOTE — Progress Notes (Signed)
Utilization Review Completed.Donne Anon T12/15/2015

## 2014-12-12 NOTE — Progress Notes (Signed)
  Echocardiogram Echocardiogram Transesophageal has been performed.  Darlina Sicilian M 12/12/2014, 10:17 AM

## 2014-12-12 NOTE — Progress Notes (Signed)
Cardiology PM Note:  Pt is extubated and currently on bipap. Hemodynamically stable. SBP 150-170. Will restart home dose of Cozaar. Will start metoprolol.   [name] 12/12/2014 3:04 PM

## 2014-12-12 NOTE — Anesthesia Postprocedure Evaluation (Signed)
  Anesthesia Post-op Note  Patient: [name]  Procedure(s) Performed: Procedure(s): TRANSCATHETER AORTIC VALVE REPLACEMENT, TRANSFEMORAL (N/A) INTRAOPERATIVE TRANSESOPHAGEAL ECHOCARDIOGRAM (N/A)  Patient Location: ICU  Anesthesia Type:General  Level of Consciousness: awake and alert   Airway and Oxygen Therapy: Patient Spontanous Breathing and on BiPAP  Post-op Pain: none  Post-op Assessment: Post-op Vital signs reviewed, Patient's Cardiovascular Status Stable, Respiratory Function Stable, Patent Airway, No signs of Nausea or vomiting and Pain level controlled  Post-op Vital Signs: Reviewed and stable  Last Vitals:  Filed Vitals:   12/12/14 1312  BP:   Pulse: 70  Temp: 36.5 C  Resp: 16    Complications: No apparent anesthesia complications

## 2014-12-12 NOTE — CV Procedure (Signed)
[facility]  TAVR OPERATIVE NOTE   Date of Procedure:  12/12/2014  Preoperative Diagnosis: Severe Aortic Stenosis   Postoperative Diagnosis: Same   Procedure:    Transcatheter Aortic Valve Replacement - Transfemoral Approach  Edwards Sapien 3 THV (size 26 mm, model # 9600TFX, serial # [id number])   Co-Surgeons: Gaye Pollack, MD and Lauree Chandler, MD   Assistants:  Valentina Gu. Roxy Manns, MD and Sherren Mocha, MD  Anesthesiologist: Dr. Ermalene Postin   Echocardiographer:  Loralie Champagne, MD  Pre-operative Echo Findings:  Severe aortic stenosis  Normal left ventricular systolic function  Post-operative Echo Findings:  Trivial paravalvular leak  Normal left ventricular systolic function  BRIEF CLINICAL NOTE AND INDICATIONS FOR SURGERY  53 yo WF with history of CAD, OSA, HTN, HLD, obesity, GERD, asthma, COPD, former tobacco abuse, depression and severe aortic valve stenosis. I saw her for the first time in December 2012. She had a cardiac cath in February 2010 and had a drug eluting stent (2.35mm Xience) placed in the distal RCA per Dr. Melvern Banker. Her LAD and Circumflex were free of disease. She had her right knee replaced in February 8,2013. She developed SOB on 02/08/12 and was found to have a PE and pneumonia. She was started on coumadin and treated for 6 months. CTA chest on 08/09/12 showed no evidence of PE so her coumadin was stopped. I saw her February 20,2014 and she c/o severe dyspnea with minimal exertion. I felt that this was multi-factorial. Chest CTA with no PE. I arranged a stress myoview which showed possible ischemia. Echo with severe AS, normal LV function. Cardiac cath on 03/17/13 with mild non-obstructive disease and patent stent RCA. TEE with moderate AS. Echo 08/10/14 with normal LV function, severe AS with mean gradient of 50 mmHg, peak gradient 77 mmHg.   During the course of the patient's preoperative work up they have been evaluated comprehensively by a  multidisciplinary team of specialists coordinated through the [facility] in the [facility].  They have been demonstrated to suffer from symptomatic severe aortic stenosis as noted above. The patient has been counseled extensively as to the relative risks and benefits of all options for the treatment of severe aortic stenosis including long term medical therapy, conventional surgery for aortic valve replacement, and transcatheter aortic valve replacement.  The patient has been independently evaluated by two cardiac surgeons including Dr Roxy Manns and Dr. Cyndia Bent, and they are felt to be at high risk for conventional surgical aortic valve replacement based upon a predicted risk of mortality using the Society of Thor[employer] Surgeons risk calculator of 5.8%. Both surgeons indicated the patient would be a poor candidate for conventional surgery (predicted risk of mortality >15% and/or predicted risk of permanent morbidity >50%) because of comorbidities including morbid obesity, severe COPD. Obstructive sleep apnea.   Based upon review of all of the patient's preoperative diagnostic tests they are felt to be candidate for transcatheter aortic valve replacement using the transfemoral approach as an alternative to high risk conventional surgery.    Following the decision to proceed with transcatheter aortic valve replacement, a discussion has been held regarding what types of management strategies would be attempted intraoperatively in the event of life-threatening complications, including whether or not the patient would be considered a candidate for the use of cardiopulmonary bypass and/or conversion to open sternotomy for attempted surgical intervention.  The patient has been advised of a variety of complications that might develop peculiar to this  approach including but not limited to risks of death, stroke, paravalvular leak, aortic dissection or other major vascular  complications, aortic annulus rupture, device embolization, cardiac rupture or perforation, acute myocardial infarction, arrhythmia, heart block or bradycardia requiring permanent pacemaker placement, congestive heart failure, respiratory failure, renal failure, pneumonia, infection, other late complications related to structural valve deterioration or migration, or other complications that might ultimately cause a temporary or permanent loss of functional independence or other long term morbidity.  The patient provides full informed consent for the procedure as described and all questions were answered preoperatively.    DETAILS OF THE OPERATIVE PROCEDURE  PREPARATION:    The patient is brought to the operating room on the above mentioned date and central monitoring was established by the anesthesia team including placement of Swan-Ganz catheter and radial arterial line. The patient is placed in the supine position on the operating table.  Intravenous antibiotics are administered. General endotracheal anesthesia is induced uneventfully. A Foley catheter is placed.  Baseline transesophageal echocardiogram was performed. The patient's chest, abdomen, both groins, and both lower extremities are prepared and draped in a sterile manner. A time out procedure is performed.   PERIPHERAL ACCESS:    Using the modified Seldinger technique, femoral arterial and venous access was obtained with placement of 6 Fr sheaths on the left side.  A pigtail diagnostic catheter was passed through the left arterial sheath under fluoroscopic guidance into the aortic root.  A temporary transvenous pacemaker catheter was passed through the left femoral venous sheath under fluoroscopic guidance into the right ventricle.  The pacemaker was tested to ensure stable lead placement and pacemaker capture. Aortic root angiography was performed in order to determine the optimal angiographic angle for valve deployment.   TRANSFEMORAL  ACCESS:   A right femoral arterial cutdown was performed by Dr Cyndia Bent. Please see his separate operative note for details. The patient was heparinized systemically and ACT verified > 250 seconds.    A 14 Fr transfemoral E-sheath was introduced into the right femoral artery after progressively dilating over an Amplatz superstiff wire. An AL1 catheter was used to direct a straight-tip exchange length wire across the native aortic valve into the left ventricle. This was exchanged out for a pigtail catheter and position was confirmed in the LV apex. Simultaneous LV and Ao pressures were recorded.  The pigtail catheter was then exchanged for an Amplatz Extra-stiff wire in the LV apex. At that point, BAV was performed using a 23 mm valvuloplasty balloon.  Once optimal position was achieved, BAV was done under rapid ventricular pacing at 180 bpm. The patient recovered well hemodynamically.   TRANSCATHETER HEART VALVE DEPLOYMENT:  An Edwards Sapien 3 THV (size 26 mm) was prepared and crimped per manufacturer's guidelines, and the proper orientation of the valve is confirmed on the [employer] delivery system. The valve was advanced through the introducer sheath using normal technique until in an appropriate position in the abdominal aorta beyond the sheath tip. The balloon was then retracted and using the fine-tuning wheel was centered on the valve. The valve was then advanced across the aortic arch using appropriate flexion of the catheter. The valve was carefully positioned across the aortic valve annulus. The Commander catheter was retracted using normal technique. Once final position of the valve has been confirmed by angiographic assessment, the valve is deployed while temporarily holding ventilation and during rapid ventricular pacing to maintain systolic blood pressure < 50 mmHg and pulse pressure < 10 mmHg. The balloon  inflation is held for >3 seconds after reaching full deployment volume. Once the  balloon has fully deflated the balloon is retracted into the ascending aorta and valve function is assessed using TEE. There is felt to be trivial paravalvular leak and no central aortic insufficiency.  The patient's hemodynamic recovery following valve deployment is good.  The deployment balloon and guidewire are both removed. Echo demostrated acceptable post-procedural gradients, stable mitral valve function, and trivial AI. Aortography confirmed no greater than mild AI.   PROCEDURE COMPLETION:  The sheath was then removed and arteriotomy repaired by Dr Cyndia Bent. Please see his separate report for details. Distal abdominal aortography was performed to evaluate for any arterial injury related to the procedure. There was no evidence dissection, perforation, or other vascular injury in the abdominal aorta, iliac artery, or femoral artery.  Protamine was administered once femoral arterial repair was complete. The temporary pacemaker, pigtail catheters and femoral sheaths were removed with manual pressure used for hemostasis.   The patient tolerated the procedure well and is transported to the surgical intensive care in stable condition. There were no immediate intraoperative complications. All sponge instrument and needle counts are verified correct at completion of the operation.   No blood products were administered during the operation.  The patient received a total of 115 mL of intravenous contrast during the procedure.  Kenetra Hildenbrand MD 12/12/2014 8:06 AM

## 2014-12-12 NOTE — Transfer of Care (Signed)
Immediate Anesthesia Transfer of Care Note  Patient: [name]  Procedure(s) Performed: Procedure(s): TRANSCATHETER AORTIC VALVE REPLACEMENT, TRANSFEMORAL (N/A) INTRAOPERATIVE TRANSESOPHAGEAL ECHOCARDIOGRAM (N/A)  Patient Location: ICU  Anesthesia Type:General  Level of Consciousness: sedated  Airway & Oxygen Therapy: Patient Spontanous Breathing and Patient connected to face mask oxygen  Post-op Assessment: Report given to PACU RN and Post -op Vital signs reviewed and stable  Post vital signs: Reviewed and stable  Complications: No apparent anesthesia complications

## 2014-12-12 NOTE — Progress Notes (Signed)
Pt had been on PS but switched to NIV PC due to shallow breathing and current ABG results.

## 2014-12-12 NOTE — H&P (Signed)
[address]       [place] [address]             [phone]      Cardiothoracic Surgery History and Physical   Referring Provider is Burnell Blanks* PCP is Theressa Millard, MD  Chief Complaint  Patient presents with  . Aortic Stenosis    consult for surgery vs. TAVR  . Coronary Artery Disease    HPI:  The patient is a 53 year old woman with a history of morbid obesity, asthma and COPD, OSA, HTN, and hyperlipidemia who has know coronary disease who had a drug eluting stent placed in the distal RCA in 01/2009. She had no disease in the LAD or LCX at that time. She had a right total knee replacement in 01/2012 and developed shortness of breath postop. She was diagnosed with a PE and pneumonia and was treated with Coumadin for 6 months. She had a CTA of the chest on 08/09/2012 to follow up on the PE because she was having chronic shortness of breath. This showed no PE and the coumadin was stopped. In Feb 2014 she was complaining of severe dyspnea with minimal exertion and a CTA of the chest was repeated and showed no PE. She had a stress myoview that showed possible ischemia. An echo showed severe AS with a mean gradient of 40 mm Hg and a peak of 57 mm Hg. The AVA was 0.74 cm2. LV function was normal. Cardiac cath on 03/17/2013 showed a patent stent in the RCA and otherwise mild non-obstructive disease. A TEE was done that showed moderate AS with a mean gradient of 29 mm Hg. The AVA by planimetry was 1.2-1.4 cm. Her most recent echo on 08/10/2014 shows normal LV function with severe AS with a mean gradient of 50 mm Hg and a peak of 77 mm Hg. She was seen by Dr. Angelena Form complaining of a several week history of of worsening dyspnea with exertion. She says she can only take a few steps before she gets short of breath and has to sit down. She can usually walk with a cane but has been using a wheelchair recently. She also reports some chest pains and  dizziness. She underwent a cath on 09/14/2014 which showed a peak to peak gradient across the aortic valve of 46 mm Hg. The RCA had some ostial stenosis estimated at 70-80% followed by a 40% proximal stenosis. The distal stented segment is patent with no restenosis. There was mild pulmonary HTN with a PAP of 42/28 with a CI of 2.1. A TEE was done to further evaluate the aortic valve and it showed a functionally bicuspid valve with severe stenosis and mild regurgitation.  Past Medical History  Diagnosis Date  . Asthma   . Hypertension   . Hyperlipidemia   . GERD (gastroesophageal reflux disease)   . Depression   . Blood transfusion     2013[facility]  . Headache(784.0)     h/o migraines  . Pulmonary embolism 02/08/12    S/P RT TOTAL KNEE ON 02/03/12--ON 02/08/12--DEVELOPED ACUTE SOB AND CHEST PAIN--AND DIAGNOSED WITH PULMONARY EMBOLUS AND PNEUMONIA  . Restless leg syndrome   . Myocardial infarction     PT THINKS SHE WAS DX WITH MI AT THE TIME OF HEART STENTING  . CAD (coronary artery disease)     Cath 2010 with DES x 1 RCA-- PT'S CARDIOLOGIST IS DR. [place]  . Uterine fibroid  NO PROBLEMS AT PRESENT FROM THE FIBROIDS-STATES SHE IS POST MENOPAUSAL-LAST MENSES 2010 EXCEPT FOR EPISODE THIS YR OF BLEEDING RELATED TO FIBROIDS.  Marland Kitchen Heart murmur   . Pneumonia 02/08/12    [facility]IZ[facility] AT [facility] WITH PNEUMONIA AND WITH PULMONARY EMBOLUS  . Diabetes mellitus DIAGNOSED IN2010    ORAL MEDS  . Arthritis     PAIN AND SEVERE OA LEFT KNEE ; S/P RIGHT TKA ON 02/03/12; HAS LOWER BACK PAIN-UNABLE TO STAND MORE THAN 10 MIN; ARTHRITIS "ALL OVER"  . Neck pain   . Weakness     BOTH HANDS - S/P BILATERAL CARPAL TUNNEL RELEASE--BUT STILL HAS WEAKNESS--OFTEN DROPS THINGS  . Shortness of breath     with exertion  . Eczema     on back  . Kidney stones   . Anemia   . COPD (chronic obstructive pulmonary disease)    . Sleep apnea     uses 3 liters O2 at night   . Aortic valve stenosis, severe     Past Surgical History  Procedure Laterality Date  . Tubal ligation    . Carpal tunnel release      Bilateral  . Cholecystectomy    . Hernia repair    . Cardiac catheterization    . Knee arthroplasty  02/03/2012    Procedure: COMPUTER ASSISTED TOTAL KNEE ARTHROPLASTY; Surgeon: Mcarthur Rossetti, MD; Location: [place]; Service: Orthopedics; Laterality: Right; Right total knee arthroplasty  . Coronary angioplasty      2010 has stent in place  . Total knee arthroplasty  09/10/2012    Procedure: TOTAL KNEE ARTHROPLASTY; Surgeon: Mcarthur Rossetti, MD; Location: [facility]; Service: Orthopedics; Laterality: Left;  . Trigger finger release  09/10/2012    Procedure: RELEASE TRIGGER FINGER/A-1 PULLEY; Surgeon: Mcarthur Rossetti, MD; Location: [facility]; Service: Orthopedics; Laterality: Right; Right Ring Finger  . Tee without cardioversion N/A 03/14/2013    Procedure: TRANSESOPHAGEAL ECHOCARDIOGRAM (TEE); Surgeon: Lelon Perla, MD; Location: [facility]; Service: Cardiovascular; Laterality: N/A;  . Joint replacement      bil total knees  . Total knee revision Right 07/15/2013    Procedure: REVISION ARTHROPLASTY RIGHT KNEE; Surgeon: Mcarthur Rossetti, MD; Location: [facility]; Service: Orthopedics; Laterality: Right;  . Cystoscopy w/ retrogrades Right 09/21/2013    Procedure: CYSTOSCOPY WITH RIGHT RETROGRADE PYELOGRAM RIGHT DOUBLE J STENT ; Surgeon: Fredricka Bonine, MD; Location: [facility]; Service: Urology; Laterality: Right;  . Cystoscopy with ureteroscopy and stent placement Right 10/25/2013    Procedure: CYSTOSCOPY RIGHT URETEROSCOPY HOLMIUM LASER LITHO AND STENT PLACEMENT; Surgeon: Fredricka Bonine, MD; Location: [facility]; Service: Urology; Laterality: Right;     Family History  Problem Relation Age of Onset  . Breast cancer Mother   . COPD Father     smoked  . Cancer Brother     Sinus  . Emphysema Mother     smoked  . Asthma Father   . Heart disease Mother   . Heart disease Father     History   Social History  . Marital Status: Married    Spouse Name: N/A    Number of Children: 2  . Years of Education: N/A   Occupational History  . Disabled    Social History Main Topics  . Smoking status: Former Smoker -- 1.50 packs/day for 30 years    Types: Cigarettes    Quit date: 12/29/2000  . Smokeless tobacco: Never Used  . Alcohol Use: No  . Drug Use: No  . Sexual  Activity: Yes    Birth Control/ Protection: Surgical   Other Topics Concern  . Not on file   Social History Narrative  . No narrative on file    Current Outpatient Prescriptions  Medication Sig Dispense Refill  . albuterol (PROVENTIL HFA;VENTOLIN HFA) 108 (90 BASE) MCG/ACT inhaler Inhale 2 puffs into the lungs every 4 (four) hours as needed for wheezing or shortness of breath (((PLAN B))).     Marland Kitchen albuterol (PROVENTIL) (2.5 MG/3ML) 0.083% nebulizer solution Take 2.5 mg by nebulization every 4 (four) hours as needed for wheezing or shortness of breath (((PLAN C))).     Marland Kitchen aspirin EC 325 MG tablet Take 325 mg by mouth 2 (two) times daily.    Marland Kitchen atorvastatin (LIPITOR) 80 MG tablet Take 80 mg by mouth daily.    . budesonide-formoterol (SYMBICORT) 160-4.5 MCG/ACT inhaler Inhale 2 puffs into the lungs 2 (two) times daily.    . clopidogrel (PLAVIX) 75 MG tablet Take 75 mg by mouth every morning.     . escitalopram (LEXAPRO) 20 MG tablet Take 20 mg by mouth every morning.     . ezetimibe (ZETIA) 10 MG tablet Take 10 mg by mouth daily.    . furosemide (LASIX) 40 MG tablet Take 40 mg by mouth 2 (two) times  daily.    Marland Kitchen gabapentin (NEURONTIN) 300 MG capsule Take 300 mg by mouth 2 (two) times daily.     Marland Kitchen glipiZIDE (GLUCOTROL) 5 MG tablet Take 5 mg by mouth daily before supper.     . losartan (COZAAR) 100 MG tablet Take 100 mg by mouth daily with breakfast.     . MAGNESIUM PO Take 1-2 tablets by mouth daily as needed (constipation).     . Multiple Vitamin (MULTIVITAMIN) tablet Take 1 tablet by mouth daily.    Donell Sievert IN Inhale into the lungs.    . potassium chloride SA (K-DUR,KLOR-CON) 20 MEQ tablet Take 40 mEq by mouth daily.     No current facility-administered medications for this visit.    No Known Allergies    Review of Systems:  General:normal appetite, marked decreased energy, gradual weight gain, no weight loss, no fever Cardiac:intermittent chest pain with exertion, no chest pain at rest, marked SOB with minimal exertion, resting SOB, no PND, some orthopnea, no palpitations, no arrhythmia, no atrial fibrillation, marked LE edema, some dizzy spells, no syncope Respiratory:marked shortness of breath, on home oxygen, no productive cough, no dry cough, no bronchitis, frequent wheezing, no hemoptysis, has asthma, no pain with inspiration or cough, has sleep apnea, uses CPAP at night GI:no difficulty swallowing, has reflux, no frequent heartburn, has hiatal hernia, no abdominal pain, no constipation, no diarrhea, no hematochezia, no hematemesis, no melena GU:no dysuria, no frequency, no urinary tract infection, no hematuria, no kidney stones, no kidney disease Vascular:no pain suggestive of claudication, no pain in feet, no leg cramps, no varicose veins, no DVT, no non-healing foot  ulcer Neuro:no stroke, no TIA's, no seizures, no headaches, notemporary blindness one eye, no slurred speech, no peripheral neuropathy, no chronic pain, has instability of gait, no memory/cognitive dysfunction Musculoskeletal:has arthritis, has joint swelling, no myalgias, has difficulty walking, decreased mobility  Skin:no rash, no itching, no skin infections, no pressure sores or ulcerations Psych:has anxiety, has depression, has nervousness, no unusual recent stress Eyes:no blurry vision, no floaters, no recent vision changes, wears glasses or contacts ENT:no hearing loss, no loose or painful teeth, partial dentures, last saw dentist 6 months ago Hematologic:no easy bruising,  no abnormal bleeding, no clotting disorder, no frequent epistaxis Endocrine:has diabetes     Physical Exam:  BP 141/76  Pulse 84  Resp 16  Ht 5' (1.524 m)  Wt 354 lb (160.573 kg)  BMI 69.14 kg/m2  SpO2 96%  LMP 07/26/2009 General:Morbidly obese chronically ill-appearing HEENT:Unremarkable , NCAT, PERLA, EOMI, multiple missing teeth Neck:no JVD, no bruits, no adenopathy or thyromegaly but neck is very thick Chest:clear to auscultation, symmetrical breath sounds, no wheezes, no rhonchi  CV:RRR, grade III/VI crescendo/decrescendo murmur heard best at RSB, no diastolic murmur Abdomen:soft, non-tender, no masses , morbidly obese Extremities:warm,  well-perfused, pulses not palpable, moderate LE edema Rectal/GUDeferred Neuro:Grossly non-focal and symmetrical throughout Skin:Clean and dry, no rashes, no breakdown   Diagnostic Tests:  [facility] [address], [place] [address] [phone]  ------------------------------------------------------------------- Transthoracic Echocardiography  Patient: Dickie, Labarre MR #: [id number] Study Date: 08/10/2014 Gender: F Age: 68 Height: 160 cm Weight: 159.7 kg BSA: 2.78 m^2 Pt. Status: Room:  ATTENDING Darlina Guys, MD SONOGRAPHER Cindy Hazy, RDCS ORDERING [place], [place], Outpatient  cc:  ------------------------------------------------------------------- LV EF: 60% - 65%  ------------------------------------------------------------------- Indications: 424.1 Aortic valve disorders.  ------------------------------------------------------------------- History: PMH: Acquired from the patient and from the patient&'s chart. PMH: History of Pulmonary Embolism. CAD. COPD. Obstructive Sleep Apnea. Shortness of breath. Pneumonia. Murmur. Risk factors: Former tobacco use. Hypertension. Diabetes mellitus. Morbidly obese. Dyslipidemia.  ------------------------------------------------------------------- Study Conclusions  - Left ventricle: The cavity size was normal. Wall thickness was normal. Systolic function was normal. The estimated ejection fraction was in the range of 60% to 65%. Doppler parameters are consistent with abnormal left ventricular relaxation (grade 1 diastolic dysfunction). - Aortic valve: AV is difficult to see well From right upper parasternal window peak and mean gradients through the valve are 77 and 50 mm Hg respectively consistent with severe/critical AS. This  is increased from echo of 2014.  Transthoracic echocardiography. M-mode, complete 2D, spectral Doppler, and color Doppler. Birthdate: Patient birthdate: 12-Jun-1961. Age: Patient is 53 yr old. Sex: Gender: female. BMI: 62.4 kg/m^2. Blood pressure: 120/70 Patient status: Outpatient. Study date: Study date: 08/10/2014. Study time: 01:23 PM. Location: [place] [facility]  -------------------------------------------------------------------  ------------------------------------------------------------------- Left ventricle: The cavity size was normal. Wall thickness was normal. Systolic function was normal. The estimated ejection fraction was in the range of 60% to 65%. Doppler parameters are consistent with abnormal left ventricular relaxation (grade 1 diastolic dysfunction).  ------------------------------------------------------------------- Aortic valve: AV is difficult to see well From right upper parasternal window peak and mean gradients through the valve are 77 and 50 mm Hg respectively consistent with severe/critical AS. This is increased from echo of 2014. Mildly thickened, mildly calcified leaflets. Doppler: There was no significant regurgitation. VTI ratio of LVOT to aortic valve: 0.34. Valve area (VTI): 1.07 cm^2. Indexed valve area (VTI): 0.39 cm^2/m^2. Valve area (Vmax): 1.02 cm^2. Indexed valve area (Vmax): 0.37 cm^2/m^2. Mean gradient (S): 52 mm Hg. Peak gradient (S): 77 mm Hg.  ------------------------------------------------------------------- Mitral valve: Calcified annulus. Mildly thickened leaflets . Doppler: There was trivial regurgitation. Peak gradient (D): 5 mm Hg.  ------------------------------------------------------------------- Right ventricle: The cavity size was normal. Wall thickness was normal. Systolic function was normal.  ------------------------------------------------------------------- Right atrium: The atrium was normal in  size.  ------------------------------------------------------------------- Pericardium: There was no pericardial effusion.  ------------------------------------------------------------------- Systemic veins: Inferior vena cava: The vessel was mildly dilated. The respirophasic diameter changes were in the normal range (= 50%), consistent with normal central venous  pressure.  ------------------------------------------------------------------- Measurements  Left ventricle Value 03/14/2013 Reference LV ID, ED, PLAX 52 mm ---------- 43 - 52 chordal LV ID, ES, PLAX 29 mm ---------- 23 - 38 chordal LV fx shortening, 44 % ---------- >=29 PLAX chordal LV PW thickness, ED 10 mm ---------- --------- IVS/LV PW ratio, ED 1 ---------- <=1.3 Stroke volume, 2D 91 ml ---------- --------- Stroke volume/bsa, 33 ml/m^2 ---------- --------- 2D LV e&', lateral 8.7 cm/s ---------- --------- LV E/e&', lateral 12.3 ---------- --------- LV e&', medial 7.4 cm/s ---------- --------- LV E/e&', medial 14.46 ---------- --------- LV e&', average 8.05 cm/s ---------- --------- LV E/e&', average 13.29 ---------- ---------  Ventricular septum Value 03/14/2013 Reference IVS thickness, ED 10 mm ---------- ---------  LVOT Value 03/14/2013 Reference LVOT ID, S 20 mm ---------- --------- LVOT area 3.14 cm^2 ---------- --------- LVOT VTI, S 29.1 cm ---------- ---------  Aortic valve Value 03/14/2013 Reference Aortic valve peak 438 cm/s 320 --------- velocity, S Aortic valve mean 329 cm/s 235 --------- velocity, S Aortic valve VTI, S 85.2 cm 79.5 --------- Aortic mean 52 mm Hg 29 --------- gradient, S Aortic peak 77 mm Hg 41 --------- gradient, S VTI ratio, LVOT/AV 0.34 ---------- --------- Aortic valve area, 1.07 cm^2 ---------- --------- VTI Aortic valve 0.39 cm^2/m^2 ---------- --------- area/bsa, VTI Aortic valve area, 1.02 cm^2 ---------- --------- peak velocity Aortic valve 0.37 cm^2/m^2  ---------- --------- area/bsa, peak velocity  Aorta Value 03/14/2013 Reference Aortic root ID, ED 25 mm ---------- ---------  Left atrium Value 03/14/2013 Reference LA ID, A-P, ES 40 mm ---------- --------- LA ID/bsa, A-P 1.44 cm/m^2 ---------- <=2.2  Mitral valve Value 03/14/2013 Reference Mitral E-wave peak 107 cm/s ---------- --------- velocity Mitral A-wave peak 130 cm/s ---------- --------- velocity Mitral deceleration 218 ms ---------- 150 - 230 time Mitral peak 5 mm Hg ---------- --------- gradient, D Mitral E/A ratio, 0.8 ---------- --------- peak  Right ventricle Value 03/14/2013 Reference RV s&', lateral, S 22.7 cm/s ---------- ---------  Legend: (L) and (H) mark values outside specified reference range.  ------------------------------------------------------------------- Prepared and Electronically Authenticated by  Dorris Carnes, M.D. 2015-08-13T16:57:06   Cardiac Catheterization Operative Report  Hollie Salk  [id number]  9/17/20159:55 AM  Theressa Millard, MD  Procedure Performed:  1. Left Heart Catheterization 2. Selective Coronary Angiography 3. Right Heart Catheterization Operator: Lauree Chandler, MD  Indication: 53 yo WF with history of CAD, OSA, HTN, HLD, obesity, GERD, asthma, COPD, former tobacco abuse, depression here today for cardiac catheterization. I saw her for the first time in December 2012. She had a cardiac cath in February 2010 and had a drug eluting stent (2.45mm Xience) placed in the distal RCA per Dr. Melvern Banker. Her LAD and Circumflex were free of disease. She had her right knee replaced in February 8,2013. She developed SOB on 02/08/12 and was found to have a PE and pneumonia. She was started on coumadin and treated for 6 months. CTA chest on 08/09/12 showed no evidence of PE so her coumadin was stopped. I saw her February 20,2014 and she c/o severe dyspnea with minimal exertion. I felt that this was multi-factorial. Chest  CTA with no PE. I arranged a stress myoview which showed possible ischemia. Echo with severe AS, normal LV function. Cardiac cath on 03/17/13 with mild non-obstructive disease and patent stent RCA. TEE with moderate AS. Most recent echo 08/10/14 with normal LV function, severe AS with mean gradient of 50 mmHg, peak gradient 77 mmHg. She has had worsening of dyspnea with exertion. She cannot walk across the room  without severe dyspnea. Right and left heart cath to assess pressures, exclude progression of CAD.  Procedure Details:  The risks, benefits, complications, treatment options, and expected outcomes were discussed with the patient. The patient and/or family concurred with the proposed plan, giving informed consent. The patient was brought to the cath lab after IV hydration was begun and oral premedication was given. The patient was further sedated with Versed and Fentanyl. Due to her morbid obesity, our initial plan was to perform right heart cath from the right antecubital vein and her left heart cath from the right radial artery. There was a IV line present in the right antecubital vein. This area was prepped and draped. I could not advance a wire through the IV catheter. I then prepped the right groin and placed a 7 French sheath in the right femoral vein. Right heart cath was performed with a balloon tipped catheter. I then prepped the right wrist. 1% lidocaine used for local anesthesia. A 5 French sheath was placed in the right radial artery. 3 mg Verapamil given through the sheath. 6000 Units IV heparin through sheath. Standard diagnostic catheters were used to perform selective coronary angiography. I was never able to fully engage the RCA due to ostial stenosis and instability of the catheter due to the high velocity jet in the aorta from her AS. I was able to cross the aortic valve with a AL-1 catheter and long straight wire. Pressures were measured. There were no immediate complications. The patient  was taken to the recovery area in stable condition.  Hemodynamic Findings:  Ao: 136/80  LV: 182/15/28  RA: 6  RV: 50/10/16  PA: 42/28 (mean 35)  PCWP: 23  Fick Cardiac Output: 4.99 L/min  Fick Cardiac Index: 2.1 L/min/m2  Central Aortic Saturation: 97%  Pulmonary Artery Saturation: 59%  Aortic Valve Data: Peak to peak gradient 46 mmHg  Mean gradient 12 mmHg  Angiographic Findings:  Left main: No obstructive disease.  Left Anterior Descending Artery: Large caliber vessel that courses to the apex. There is a moderate caliber diagonal branch. No obstructive disease noted.  Circumflex Artery: Large caliber vessel with two small obtuse marginal branches and a moderate caliber posterolateral branch. No obstructive disease noted.  Right Coronary Artery: Large dominant vessel with 70-80% ostial stenosis. 40% proximal stenosis. Distal stented segment is patent with no restenosis.  Left Ventricular Angiogram: Deferred.  Impression:  1. Single vessel CAD with ostial RCA stenosis, patent distal RCA stent  2. Severe aortic valve stenosis  3. Morbid obesity  Recommendations: She has progression of her CAD with ostial RCA stenosis and severe AS. She will need AVR and bypass of RCA. I will refer her to see one of our CT surgeons. She will be high risk for traditional AVR due to size. May have to consider TAVR/PCI of RCA.  Complications: None; patient tolerated the procedure well.                  *[facility]*            [place], [place] [address]              [phone]  ------------------------------------------------------------------- Transesophageal Echocardiography  Patient:  Nori, Winegar MR #:    [id number] Study Date: 11/14/2014 Gender:   F Age:    47 Height:   152.4  cm Weight:   147.7 kg BSA:    2.61  m^2 Pt. Status: Room:  ORDERING   Fransico Him, MD REFERRING  Fransico Him, MD PERFORMING  Kirk Ruths SONOGRAPHER Jimmy Reel, RDCS ADMITTING  Ena Dawley, M.D. ATTENDING  Ena Dawley, M.D.  cc:  ------------------------------------------------------------------- LV EF: 55% -  60%  ------------------------------------------------------------------- Indications:   Aortic stenosis 424.1.  ------------------------------------------------------------------- Study Conclusions  - Left ventricle: Systolic function was normal. The estimated ejection fraction was in the range of 55% to 60%. Wall motion was normal; there were no regional wall motion abnormalities. - Aortic valve: Functionally bicuspid. There was severe stenosis. There was mild regurgitation. - Mitral valve: Mildly calcified annulus. No evidence of vegetation. There was mild regurgitation. - Left atrium: The atrium was moderately dilated. No evidence of thrombus in the atrial cavity or appendage. - Atrial septum: No defect or patent foramen ovale was identified. - Tricuspid valve: No evidence of vegetation. - Pulmonic valve: No evidence of vegetation.  Impressions:  - Normal LV function; functionally bicuspid aortic valve (fusion of noncoronary and left cusps); severe AS (AVA 0.9 cm2 by planimetry; unable to obtain gradients with transgastric views); mild AI; mild MR and TR.  Diagnostic transesophageal echocardiography. 2D and color Doppler. Birthdate: Patient birthdate: 1961-01-01. Age: Patient is 53 yr old. Sex: Gender: female.  BMI: 63.6 kg/m^2. Blood pressure: 154/76 Patient status: Outpatient. Study date: Study date: 11/14/2014. Study time: 09:35 AM. Location: Endoscopy.  -------------------------------------------------------------------  ------------------------------------------------------------------- Left ventricle: Systolic function  was normal. The estimated ejection fraction was in the range of 55% to 60%. Wall motion was normal; there were no regional wall motion abnormalities.  ------------------------------------------------------------------- Aortic valve:  Functionally bicuspid. Doppler:  There was severe stenosis.  There was mild regurgitation.  ------------------------------------------------------------------- Aorta: Descending aorta: The descending aorta had moderate diffuse disease.  ------------------------------------------------------------------- Mitral valve:  Mildly calcified annulus. Leaflet separation was normal. No evidence of vegetation. Doppler: There was mild regurgitation.  ------------------------------------------------------------------- Left atrium: The atrium was moderately dilated. No evidence of thrombus in the atrial cavity or appendage.  ------------------------------------------------------------------- Atrial septum: No defect or patent foramen ovale was identified.  ------------------------------------------------------------------- Right ventricle: The cavity size was normal. Systolic function was normal.  ------------------------------------------------------------------- Pulmonic valve:  Structurally normal valve.  Cusp separation was normal. No evidence of vegetation.  ------------------------------------------------------------------- Tricuspid valve:  Structurally normal valve.  Leaflet separation was normal. No evidence of vegetation. Doppler: There was mild regurgitation.  ------------------------------------------------------------------- Right atrium: The atrium was normal in size.  ------------------------------------------------------------------- Pericardium: There was no pericardial effusion.  ------------------------------------------------------------------- Measurements  LVOT     Value LVOT ID, S  21  mm LVOT  area   3.46 cm^2  Legend: (L) and (H) mark values outside specified reference range.  ------------------------------------------------------------------- Prepared and Electronically Authenticated by  Kirk Ruths 2015-11-17T16:40:55  ADDENDUM REPORT: 10/13/2014 13:09  CLINICAL DATA: 53 year old female with severe aortic stenosis.  EXAM: Cardiac TAVR CT  TECHNIQUE: The patient was scanned on a Philips 256 scanner. A 120 kV retrospective scan was triggered in the descending thoracic aorta at 111 HU's. Gantry rotation speed was 270 msecs and collimation was .9 mm. 5 mg of iv Metoprolol or nitro were given. The 3D data set was reconstructed in 5% intervals of the R-R cycle. Systolic and diastolic phases were analyzed on a dedicated work station using MPR, MIP and VRT modes. The patient received 80 cc of contrast.  FINDINGS: The quality of this study is affected by patient's size (BMI = 69).  Aortic Valve:  Trileaflet, moderately thickened and calcified aortic valve with moderately reduced leaflet separation.  Aorta: Normal size of aortic root and thoracic aorta. There is almost circumferential calcification starting at the upper portion of the sinuses of Valsalva including ostium of RCA, that continues into the sinotubular junction and is extending into proximal portions of the ascending thoracic aorta. The distal portion of the ascending aorta, aortic arch and descending thoracic aorta have only mild diffuse calcifications. There is no dissection.  Sinotubular Junction: 24 x 24 mm  Ascending Thoracic Aorta: 32 x 31 mm  Aortic Arch: 25 x 25 mm  Descending Thoracic Aorta: 24 x 23 mm  Sinus of Valsalva Measurements:  Non-coronary: 28 mm  Right -coronary: 28 mm  Left -coronary: 28 mm  Coronary Artery Height above Annulus:  Left Main: 11 mm  Right Coronary: 16 mm  Virtual Basal Annulus Measurements:  Maximum/Minimum  Diameter: 25 x 23 mm  Perimeter: 88 mm  Area: 470 mm2  Coronary Arteries: The study was not intended for coronary evaluation and NTG was not used.  Coronary arteries are originating in normal position. There is right dominance.  RCA has moderate to severe ostial calcifications associated with > 70% stenosis, there is another calcified lesion in the proximal RCA associated with 25-50% stenosis. Stent in distal RCA appears patent.  There are mild diffuse calcifications in the ostial left main, proximal and mid LAD and in the mid LCX associated with 0-25% stenosis.  Optimum Fluoroscopic Angle for Delivery: RAO 1 CRA 0  Sheath to tip distance: 57 mm  Other findings: Moderate concentric left ventricular hypertrophy. Mild biatrial dilatation. Pulmonary artery has normal size: 29 x 27 mm.  IMPRESSION: 1. Trileaflet, moderately thickened and calcified aortic valve with moderately reduced leaflet separation and annular measurements suitable for delivery of 26 mm Edward-SAPIEN XT valve.  2. Severe circumferential calcifications of the upper portions of sinuses of Valsalva including ostium of RCA, continuing into the sinotubular junction and extending into proximal portions of the ascending thoracic aorta. Normal size of the aortic root and all portions of the thoracic aorta.  3. Optimum Fluoroscopic Angle for Delivery: RAO 1 CRA 0.  Ena Dawley   Electronically Signed  By: Ena Dawley  On: 10/13/2014 13:09   CLINICAL DATA: 53 year old female with history of severe aortic stenosis. Preprocedural study for potential transcatheter aortic valve replacement (TAVR).  EXAM: CTA ABDOMEN AND PELVIS WITH CONTRAST  TECHNIQUE: Multidetector CT imaging of the abdomen and pelvis was performed using the standard protocol during bolus administration of intravenous contrast. Multiplanar reconstructed images and MIPs were obtained and reviewed to  evaluate the vascular anatomy.  CONTRAST: 55mL OMNIPAQUE IOHEXOL 350 MG/ML SOLN  COMPARISON: CT of the abdomen and pelvis 09/21/2013.  FINDINGS: CT ABDOMEN AND PELVIS FINDINGS  Lower chest: Minimal scarring or subsegmental atelectasis in lung bases bilaterally.  Hepatobiliary: No focal hepatic lesion. No intra or extrahepatic biliary ductal dilatation. Status post cholecystectomy.  Pancreas: Unremarkable.  Spleen: Spleen is enlarged measuring 4.7 x 14.6 x 12.5 cm (estimated splenic volume of 429 mL).  Adrenals/Urinary Tract: Normal adrenal glands bilaterally. Sub cm low-attenuation lesions in the kidneys bilaterally are too small to definitively characterize, but favored to represent tiny cysts. No hydroureteronephrosis or perinephric stranding to indicate urinary tract obstruction at this time. Urinary bladder is unremarkable in appearance.  Stomach/Bowel: The appearance of the stomach is normal. No pathologic dilatation of the small bowel or colon.  Vascular/Lymphatic: Atherosclerosis throughout the abdominal and pelvic vasculature, with findings and  measurements pertinent to potential TAVR procedure, as detailed below. No pathologically enlarged lymph nodes are noted in the abdomen or pelvis.  Reproductive: Uterus and ovaries are unremarkable in appearance.  Other: No significant volume of ascites. No pneumoperitoneum. Postoperative changes of mesh repair for ventral hernia. Notably, and in the epigastric region there is a small recurrent ventral hernia containing only omental fat. Additionally, there are 2 small ventral hernias below the umbilicus, both of which contain only omental fat.  Musculoskeletal: There are no aggressive appearing lytic or blastic lesions noted in the visualized portions of the skeleton.  VASCULAR MEASUREMENTS PERTINENT TO TAVR:  AORTA:  Minimal Aortic Diameter - 10 x 14 mm mm  Severity of Aortic Calcification -  moderate  RIGHT PELVIS:  Right Common Iliac Artery -  Minimal Diameter - 9.5 x 7.6 mm  Tortuosity - mild  Calcification - mild  Right External Iliac Artery -  Minimal Diameter - 7.5 x 5.9 mm  Tortuosity - mild  Calcification - none  Right Common Femoral Artery -  Minimal Diameter - 8.3 x 6.3 mm  Tortuosity - mild  Calcification - mild  LEFT PELVIS:  Left Common Iliac Artery -  Minimal Diameter - 6.4 x 8.6 mm  Tortuosity - mild  Calcification - mild  Left External Iliac Artery -  Minimal Diameter - 7.2 x 7.3 mm  Tortuosity - mild  Calcification - none  Left Common Femoral Artery -  Minimal Diameter - 8.1 x 5.6 mm  Tortuosity - mild  Calcification - mild  Review of the MIP images confirms the above findings.  IMPRESSION: 1. Findings and measurements pertinent to potential TAVR procedure, as detailed above. This patient does appear to have suitable pelvic arterial access. 2. Status post mesh repair for ventral hernia in the upper abdomen, with multiple ventral hernias in the epigastric region and low anterior abdominal wall, as detailed above, each of which contains only omental fat. 3. Splenomegaly. 4. Status post cholecystectomy. 5. Additional incidental findings, as above.   Electronically Signed  By: Vinnie Langton M.D.  On: 10/12/2014 15:56    STS Risk Calculator  Procedure: AV Replacement + CAB Risk of Mortality: 5.335% Morbidity or Mortality: 31.043% Long Length of Stay: 20.208% Short Length of Stay: 14.239% Permanent Stroke: 0.979% Prolonged Ventilation: 26.924% DSW Infection: 3.1% Renal Failure: 9.224% Reoperation: 10.067%    Impression:  I think she clearly has severe, symptomatic aortic stenosis with a relatively small annulus and has recently progressive shortness of breath with minimal activity. Her CHF is complicated by  morbid obesity, COPD and OSA, and severe obstructive and restrictive lung disease. Gated cardiac CT shows annular measurements suitable for a 26 mm Sapient XT valve. There is severe circumferential calcification of the upper portions of the sinuses of Valsalva continuing into the sinotubular junction and proximal ascending aorta. The abdominal and pelvic CT show suitable pelvic arterial access. Her PFT's show a severe obstructive defect and evidence of a moderate restrictive defect as well as a moderate reduction in diffusion capacity. I think TAVR would be the best option for her and her recent CT scans show that she would be a candidate for a 26 mm Sapient XT valve via a transfemoral route. 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 Angelena Form estimated at 70%. We have discussed reviewed her cath films and discussed this and feel that this can be observed for now.  During the course of the patient's preoperative work up they have been evaluated comprehensively by  a multidisciplinary team of specialists coordinated through the [facility] in the [facility]. They have been demonstrated to suffer from symptomatic severe aortic stenosis as noted above. The patient has been counseled extensively as to the relative risks and benefits of all options for the treatment of severe aortic stenosis including long term medical therapy, conventional surgery for aortic valve replacement, and transcatheter aortic valve replacement. The patient has been independently evaluated by two cardiac surgeons including myself and Dr. Roxy Manns, and they are felt to be at high risk for conventional surgical aortic valve replacement based upon a predicted risk of mortality using the Society of Thoracic Surgeons risk calculator of 5.3% and both of [place] feel that the patient would be a poor candidate for conventional surgery (predicted risk of mortality >15% and/or predicted risk of permanent  morbidity >50%) because of comorbidities including morbid obesity, oxygen-dependent severe COPD and restrictive lung disease and OSA. She also has a relatively small annulus and would require an aortic root replacement to place an adequate sized valve with conventional open surgery.  Based upon review of all of the patient's preoperative diagnostic tests they are felt to be candidate for transcatheter aortic valve replacement using the transfemoral approach as an alternative to high risk conventional surgery.   Following the decision to proceed with transcatheter aortic valve replacement, a discussion has been held regarding what types of management strategies would be attempted intraoperatively in the event of life-threatening complications, including whether or not the patient would be considered a candidate for the use of cardiopulmonary bypass and/or conversion to open sternotomy for attempted surgical intervention. The patient has been advised of a variety of complications that might develop including but not limited to risks of death, stroke, paravalvular leak, aortic dissection or other major vascular complications, aortic annulus rupture, device embolization, cardiac rupture or perforation, mitral regurgitation, acute myocardial infarction, arrhythmia, heart block or bradycardia requiring permanent pacemaker placement, congestive heart failure, respiratory failure, renal failure, pneumonia, infection, other late complications related to structural valve deterioration or migration, or other complications that might ultimately cause a temporary or permanent loss of functional independence or other long term morbidity. The patient provides full informed consent for the procedure as described and all questions were answered.   Plan:  Transcatheter aortic valve replacement using a 26 mm Sapien 3 valve via a right transfemoral route.

## 2014-12-12 NOTE — Anesthesia Preprocedure Evaluation (Addendum)
Anesthesia Evaluation  Patient identified by MRN, date of birth, ID band  Reviewed: Allergy & Precautions, H&P , NPO status , Patient's Chart, lab work & pertinent test results  History of Anesthesia Complications Negative for: history of anesthetic complications  Airway Mallampati: III  TM Distance: <3 FB Neck ROM: Full    Dental  (+) Partial Upper, Partial Lower, Chipped, Dental Advisory Given,    Pulmonary shortness of breath, with exertion and Long-Term Oxygen Therapy, asthma , sleep apnea and Oxygen sleep apnea , COPD COPD inhaler and oxygen dependent, former smoker,          Cardiovascular hypertension, Pt. on home beta blockers + CAD, + Past MI and +CHF + Valvular Problems/Murmurs AS Rhythm:Regular Rate:Normal + Systolic murmurs    Neuro/Psych  Headaches, PSYCHIATRIC DISORDERS Depression    GI/Hepatic GERD-  Medicated and Controlled,  Endo/Other  diabetes, Type 2, Oral Hypoglycemic Agents  Renal/GU      Musculoskeletal  (+) Arthritis -,   Abdominal   Peds  Hematology  (+) anemia ,   Anesthesia Other Findings   Reproductive/Obstetrics                           Anesthesia Physical Anesthesia Plan  ASA: IV  Anesthesia Plan: General   Post-op Pain Management:    Induction: Intravenous  Airway Management Planned: Oral ETT  Additional Equipment: Arterial line, CVP, PA Cath, TEE and Ultrasound Guidance Line Placement  Intra-op Plan:   Post-operative Plan: Extubation in OR and Possible Post-op intubation/ventilation  Informed Consent: I have reviewed the patients History and Physical, chart, labs and discussed the procedure including the risks, benefits and alternatives for the proposed anesthesia with the patient or authorized representative who has indicated his/her understanding and acceptance.   Dental advisory given  Plan Discussed with: CRNA and Surgeon  Anesthesia Plan  Comments:         Anesthesia Quick Evaluation

## 2014-12-12 NOTE — Interval H&P Note (Signed)
History and Physical Interval Note:  12/12/2014 7:37 AM  [name]  has presented today for surgery, with the diagnosis of SEVERE AS  The various methods of treatment have been discussed with the patient and family. After consideration of risks, benefits and other options for treatment, the patient has consented to  Procedure(s): TRANSCATHETER AORTIC VALVE REPLACEMENT, TRANSFEMORAL (N/A) INTRAOPERATIVE TRANSESOPHAGEAL ECHOCARDIOGRAM (N/A) as a surgical intervention .  The patient's history has been reviewed, patient examined, no change in status, stable for surgery.  I have reviewed the patient's chart and labs.  Questions were answered to the patient's satisfaction.     Gaye Pollack

## 2014-12-12 NOTE — OR Nursing (Signed)
Pressure held for 20 minutes on left groin after sheath removal.

## 2014-12-12 NOTE — Anesthesia Procedure Notes (Signed)
Procedure Name: Intubation Date/Time: 12/12/2014 8:12 AM Performed by: Sampson Si E Pre-anesthesia Checklist: Patient identified, Emergency Drugs available, Suction available, Patient being monitored and Timeout performed Patient Re-evaluated:Patient Re-evaluated prior to inductionOxygen Delivery Method: Circle system utilized Preoxygenation: Pre-oxygenation with 100% oxygen Intubation Type: IV induction, Rapid sequence and Cricoid Pressure applied Ventilation: Oral airway inserted - appropriate to patient size, Mask ventilation with difficulty and Two handed mask ventilation required Number of attempts: 1 Airway Equipment and Method: Stylet and Video-laryngoscopy Placement Confirmation: ETT inserted through vocal cords under direct vision,  positive ETCO2 and breath sounds checked- equal and bilateral Secured at: 22 cm Tube secured with: Tape Dental Injury: Teeth and Oropharynx as per pre-operative assessment  Difficulty Due To: Difficulty was anticipated, Difficult Airway- due to large tongue and Difficult Airway- due to limited oral opening Future Recommendations: Recommend- induction with short-acting agent, and alternative techniques readily available Comments: Patient regurgitated upon induction with succinylcholine, cricoid pressure was held, suctioned oropharynx immediately, lung sounds clear bilaterally after ETT inserted.

## 2014-12-12 NOTE — Op Note (Signed)
CARDIOTHORACIC SURGERY OPERATIVE NOTE  Date of Procedure:  12/12/2014  Preoperative Diagnosis: Severe Aortic Stenosis   Postoperative Diagnosis: Same   Procedure:    Transcatheter Aortic Valve Replacement - Right Transfemoral Approach  Edwards Sapien 3 Transcatheter Heart Valve (size 26 mm, model # 9600TFX, serial # [id number])   Co-Surgeons:  Gaye Pollack, MD and Lauree Chandler, MD  Assistants:             Valentina Gu. Roxy Manns, MD and Sherren Mocha, MD    Anesthesiologist:  Dr. Laurie Panda  Echocardiographer:  Dr. Loralie Champagne  Pre-operative Echo Findings:   severe aortic stenosis   normal left ventricular systolic function   Post-operative Echo Findings:  trivial paravalvular leak  normal left ventricular systolic function     DETAILS OF THE OPERATIVE PROCEDURE  The majority of the procedure is documented separately in a procedure note by Dr. Angelena Form.   TRANSFEMORAL ACCESS:   A small incision is made in the right groin immediately over the common femoral artery. The subcutaneous tissues are divided with electrocautery and the anterior surface of the common femoral artery is identified. Sharp dissection is utilized to free up the artery proximally and distally and the vessel is encircled with a vessel loop.  A pair of CV-4 Gore-tex sutures are place as diamond-shaped purse-strings on the anterior surface of the femoral artery.  The patient is heparinized systemically and ACT verified > 250 seconds.  The common femoral artery is punctured using an  18 gauge needle and a soft J-tipped guidewire is passed into the common iliac artery under fluoroscopic guidance.  A 6 Fr straight diagnostic catheter is placed over the guidewire and the guidewire is removed.  An Amplatz super stiff guidewire is passed through the sheath into the descending thoracic aorta and the introducing diagnostic catheter is removed.  Serial dilators are passed over the guidewire under continuous  fluoroscopic guidance, making certain that each dilator passes easily all of the way into the distal abdominal aorta.  A 14 Fr transfemoral E- sheath is passed over the guidewire into the abdominal aorta.  The introducing dilator is removed, the sheath is flushed with heparinized saline, and the sheath is secured to the skin.    FEMORAL SHEATH REMOVAL AND ARTERIAL CLOSURE:  After the completion of successful valve deployment as documented separately by Dr. Angelena Form, the femoral artery sheath is removed and the arteriotomy is closed using the previously placed Gore-tex purse-string sutures. Once the repair has been completed protamine was administered to reverse the anticoagulation. A digitally-subtracted arteriogram is obtained from just above the aortic bifurcation to below the arteriotomy to confirm the integrity of the vascular repair.  The incision is irrigated with saline solution and subsequently closed in multiple layers using absorbable suture.  The skin incision is closed using a subcuticular skin closure.    Gaye Pollack, MD 12/12/2014 8:38 PM

## 2014-12-13 ENCOUNTER — Encounter (HOSPITAL_COMMUNITY): Payer: Self-pay | Admitting: Cardiovascular Disease

## 2014-12-13 ENCOUNTER — Inpatient Hospital Stay (HOSPITAL_COMMUNITY): Payer: Medicaid Other

## 2014-12-13 DIAGNOSIS — J449 Chronic obstructive pulmonary disease, unspecified: Secondary | ICD-10-CM

## 2014-12-13 DIAGNOSIS — E1159 Type 2 diabetes mellitus with other circulatory complications: Secondary | ICD-10-CM

## 2014-12-13 DIAGNOSIS — I517 Cardiomegaly: Secondary | ICD-10-CM

## 2014-12-13 DIAGNOSIS — I350 Nonrheumatic aortic (valve) stenosis: Principal | ICD-10-CM

## 2014-12-13 DIAGNOSIS — I10 Essential (primary) hypertension: Secondary | ICD-10-CM

## 2014-12-13 LAB — GLUCOSE, CAPILLARY
GLUCOSE-CAPILLARY: 105 mg/dL — AB (ref 70–99)
GLUCOSE-CAPILLARY: 114 mg/dL — AB (ref 70–99)
GLUCOSE-CAPILLARY: 129 mg/dL — AB (ref 70–99)
GLUCOSE-CAPILLARY: 167 mg/dL — AB (ref 70–99)
GLUCOSE-CAPILLARY: 95 mg/dL (ref 70–99)
GLUCOSE-CAPILLARY: 99 mg/dL (ref 70–99)
Glucose-Capillary: 103 mg/dL — ABNORMAL HIGH (ref 70–99)
Glucose-Capillary: 110 mg/dL — ABNORMAL HIGH (ref 70–99)
Glucose-Capillary: 110 mg/dL — ABNORMAL HIGH (ref 70–99)
Glucose-Capillary: 112 mg/dL — ABNORMAL HIGH (ref 70–99)
Glucose-Capillary: 134 mg/dL — ABNORMAL HIGH (ref 70–99)
Glucose-Capillary: 151 mg/dL — ABNORMAL HIGH (ref 70–99)
Glucose-Capillary: 155 mg/dL — ABNORMAL HIGH (ref 70–99)
Glucose-Capillary: 90 mg/dL (ref 70–99)
Glucose-Capillary: 93 mg/dL (ref 70–99)
Glucose-Capillary: 97 mg/dL (ref 70–99)

## 2014-12-13 LAB — CBC
HCT: 29.4 % — ABNORMAL LOW (ref 36.0–46.0)
Hemoglobin: 9.3 g/dL — ABNORMAL LOW (ref 12.0–15.0)
MCH: 26.9 pg (ref 26.0–34.0)
MCHC: 31.6 g/dL (ref 30.0–36.0)
MCV: 85 fL (ref 78.0–100.0)
Platelets: 166 10*3/uL (ref 150–400)
RBC: 3.46 MIL/uL — ABNORMAL LOW (ref 3.87–5.11)
RDW: 14.4 % (ref 11.5–15.5)
WBC: 8.4 10*3/uL (ref 4.0–10.5)

## 2014-12-13 LAB — BASIC METABOLIC PANEL
ANION GAP: 8 (ref 5–15)
BUN: 10 mg/dL (ref 6–23)
CO2: 33 mEq/L — ABNORMAL HIGH (ref 19–32)
Calcium: 8.8 mg/dL (ref 8.4–10.5)
Chloride: 102 mEq/L (ref 96–112)
Creatinine, Ser: 0.79 mg/dL (ref 0.50–1.10)
GFR calc non Af Amer: >90 mL/min (ref >90)
Glucose, Bld: 92 mg/dL (ref 70–99)
POTASSIUM: 4 meq/L (ref 3.7–5.3)
Sodium: 143 mEq/L (ref 137–147)

## 2014-12-13 LAB — MAGNESIUM: MAGNESIUM: 1.9 mg/dL (ref 1.5–2.5)

## 2014-12-13 IMAGING — CR DG CHEST 1V PORT
1 series · 1 of 1 positions shown · non-contrast
Comparison: Portable chest x-ray [DATE]

CLINICAL DATA: Status post transcatheter aortic valve replacement ;
previous since a previous history of asthma-COPD and episode of
pulmonary embolism, diabetes

EXAM:
PORTABLE CHEST - 1 VIEW

[AP]
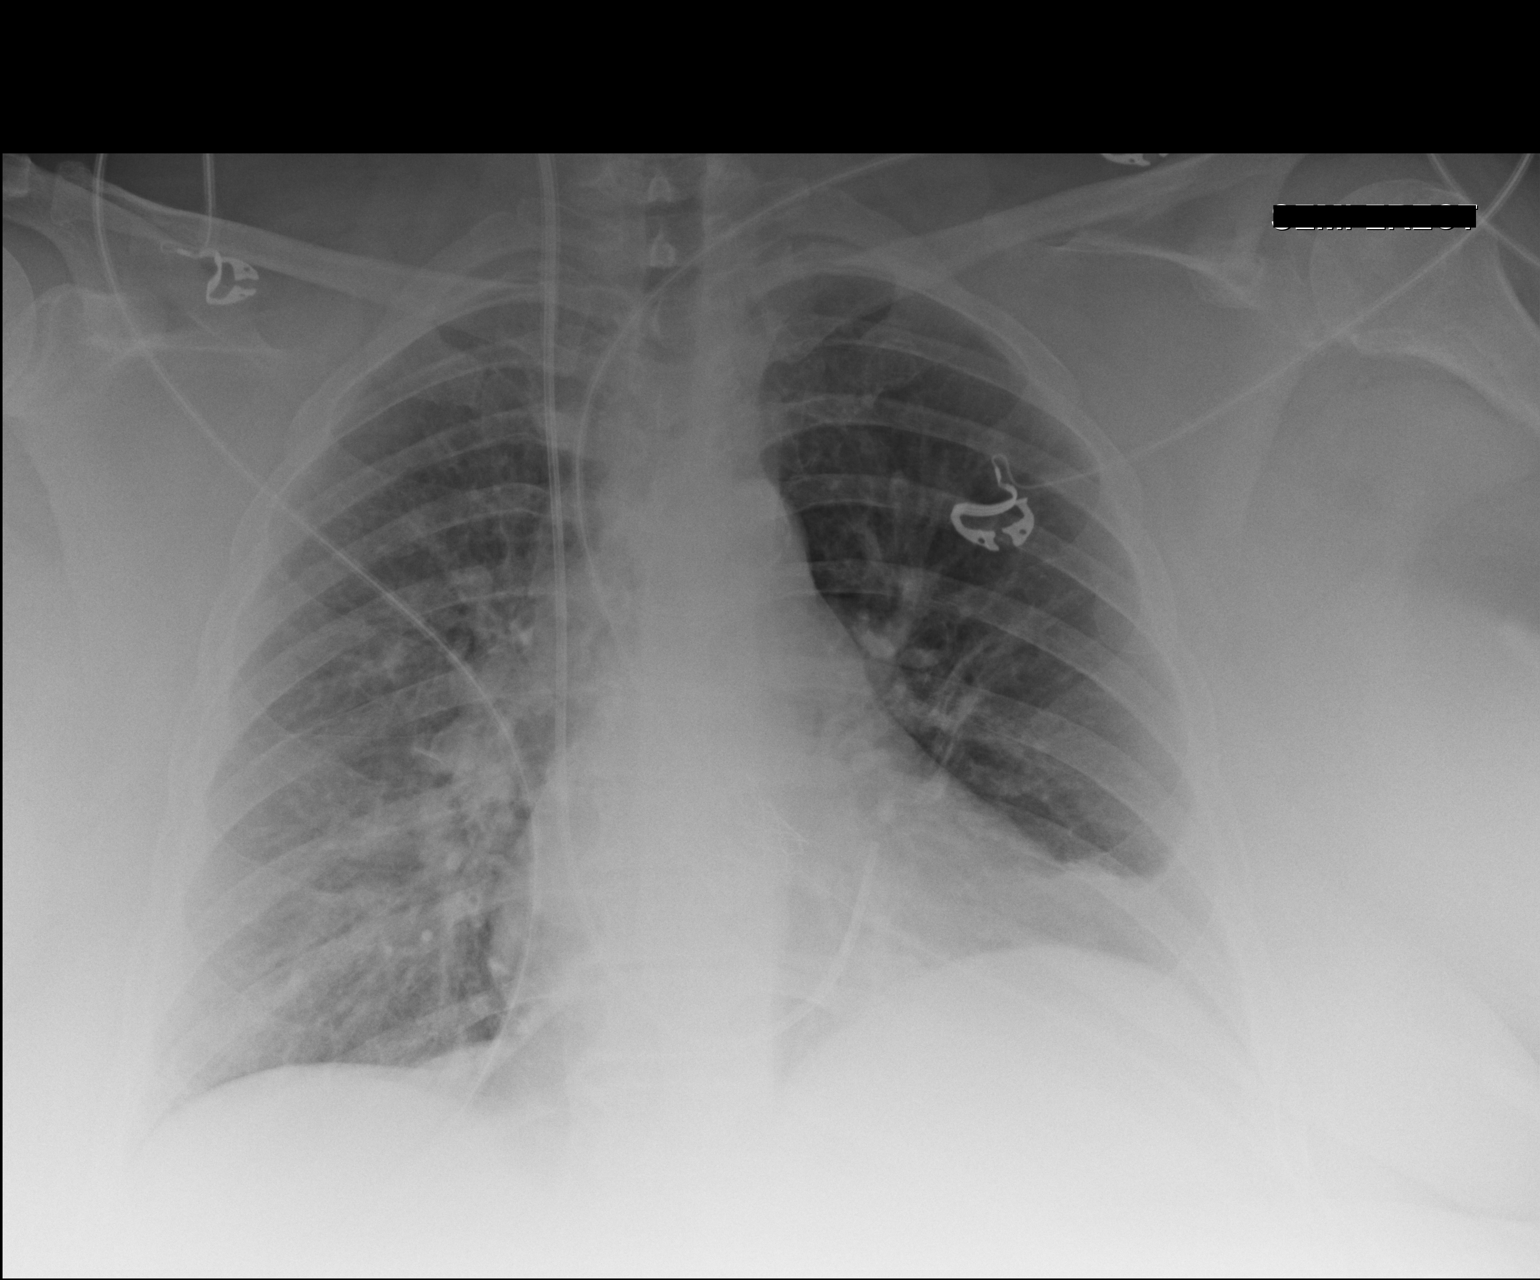

[1 of 1 positions shown; findings below may reference images not displayed]

FINDINGS: The lungs are well-expanded. The left hemidiaphragm remains mildly
elevated. The left lung base is better demonstrated today. The
interstitial markings of both lungs remain mildly increased. The
cardiac silhouette is top-normal in size. The central pulmonary
vascularity remains engorged but cephalization has decreased the
Swan-Ganz catheter tip projects in the region of the proximal
pulmonary artery trunk.
IMPRESSION: 1. Slight interval decrease in pulmonary interstitial edema with
improved aeration of the left lung base.
2. Persistent pulmonary interstitial edema bilaterally.
3. Positioning of the Swan-Ganz catheter is somewhat low.
Correlation with the adequacy of the function of the catheter is
needed. Advancement may be needed.
4. These results were called by telephone at the time of
interpretation on [DATE] at [DATE] to GEOVANII, RN,, who
verbally acknowledged these results.

## 2014-12-13 MED ORDER — CETYLPYRIDINIUM CHLORIDE 0.05 % MT LIQD
7.0000 mL | Freq: Two times a day (BID) | OROMUCOSAL | Status: DC
  Administered 2014-12-13 – 2014-12-15 (×6): 7 mL via OROMUCOSAL

## 2014-12-13 MED ORDER — INSULIN ASPART 100 UNIT/ML ~~LOC~~ SOLN
0.0000 [IU] | SUBCUTANEOUS | Status: DC
  Administered 2014-12-13 – 2014-12-14 (×3): 2 [IU] via SUBCUTANEOUS
  Administered 2014-12-14: 4 [IU] via SUBCUTANEOUS
  Administered 2014-12-14 – 2014-12-15 (×6): 2 [IU] via SUBCUTANEOUS
  Administered 2014-12-16: 4 [IU] via SUBCUTANEOUS
  Administered 2014-12-16: 2 [IU] via SUBCUTANEOUS

## 2014-12-13 MED ORDER — GLIPIZIDE 5 MG PO TABS
5.0000 mg | ORAL_TABLET | Freq: Every day | ORAL | Status: DC
  Administered 2014-12-13 – 2014-12-16 (×4): 5 mg via ORAL
  Filled 2014-12-13 (×5): qty 1

## 2014-12-13 MED FILL — Heparin Sodium (Porcine) Inj 1000 Unit/ML: INTRAMUSCULAR | Qty: 30 | Status: AC

## 2014-12-13 MED FILL — Potassium Chloride Inj 2 mEq/ML: INTRAVENOUS | Qty: 40 | Status: AC

## 2014-12-13 MED FILL — Magnesium Sulfate Inj 50%: INTRAMUSCULAR | Qty: 10 | Status: AC

## 2014-12-13 NOTE — Progress Notes (Signed)
*  PRELIMINARY RESULTS* Echocardiogram 2D Echocardiogram has been performed.  [name] 12/13/2014, 3:54 PM

## 2014-12-13 NOTE — Progress Notes (Addendum)
     SUBJECTIVE: Mild SOB. No chest pain.   Tele: sinus  BP 155/64 mmHg  Pulse 66  Temp(Src) 97.9 F (36.6 C) (Oral)  Resp 12  Wt 351 lb 6.6 oz (159.4 kg)  SpO2 95%  LMP 07/26/2009  Intake/Output Summary (Last 24 hours) at 12/13/14 5102 Last data filed at 12/13/14 0600  Gross per 24 hour  Intake 2948.38 ml  Output   1425 ml  Net 1523.38 ml    PHYSICAL EXAM General: Well developed, well nourished, in no acute distress. Alert and oriented x 3.  Psych:  Good affect, responds appropriately Neck: No JVD. No masses noted.  Lungs: Clear bilaterally with no wheezes or rhonci noted.  Heart: RRR with no murmurs noted. Abdomen: Bowel sounds are present. Soft, non-tender.  Extremities: No lower extremity edema.   LABS: Basic Metabolic Panel:  Recent Labs  12/12/14 1018 12/12/14 1143 12/13/14 0400  NA 136* 137 143  K 4.5 4.0 4.0  CL 96  --  102  CO2  --   --  33*  GLUCOSE 202* 199* 92  BUN 14  --  10  CREATININE 0.70  --  0.79  CALCIUM  --   --  8.8  MG  --   --  1.9   CBC:  Recent Labs  12/12/14 1100 12/12/14 1143 12/13/14 0400  WBC 9.9  --  8.4  HGB 10.7* 11.6* 9.3*  HCT 34.2* 34.0* 29.4*  MCV 82.2  --  85.0  PLT 162  --  166   Current Meds: . acetaminophen  1,000 mg Oral 4 times per day   Or  . acetaminophen (TYLENOL) oral liquid 160 mg/5 mL  1,000 mg Per Tube 4 times per day  . acetaminophen (TYLENOL) oral liquid 160 mg/5 mL  650 mg Per Tube Once   Or  . acetaminophen  650 mg Rectal Once  . aspirin EC  81 mg Oral Daily   Or  . aspirin  324 mg Per Tube Daily  . atorvastatin  80 mg Oral q1800  . budesonide-formoterol  2 puff Inhalation BID  . cefUROXime (ZINACEF)  IV  1.5 g Intravenous Q12H  . citalopram  20 mg Oral Daily  . clopidogrel  75 mg Oral Daily  . ezetimibe  10 mg Oral Daily  . furosemide  40 mg Oral BID  . gabapentin  300 mg Oral BID  . insulin regular  0-10 Units Intravenous TID WC  . losartan  100 mg Oral Daily  . metoprolol  tartrate  25 mg Oral BID  . pantoprazole  40 mg Oral Daily    ASSESSMENT AND PLAN:  1. Severe aortic valve stenosis:  POD #1 s/p TAVR. Will continue ASA and Plavix. Echo later today. D/C SWAN and arterial line. Out of bed today. Possible transfer to telemetry unit later today if she is stable.   2. Post-op anemia: expected anemia post procedure. Repeat CBC in am  3. COPD: Continue inhalers  4. DM: d/c insulin drip. Resume glipizide 5 mg po daily.   5. HTN: BP stable on ARB and beta blocker  MCALHANY,CHRISTOPHER  12/16/20158:29 AM

## 2014-12-13 NOTE — Plan of Care (Signed)
Problem: Phase II - Intermediate Post-Op Goal: CBGs/Blood Glucose per SCIP Criteria Outcome: Progressing Pt on insulin gtt     

## 2014-12-13 NOTE — Progress Notes (Signed)
1 Day Post-Op Procedure(s) (LRB): TRANSCATHETER AORTIC VALVE REPLACEMENT, TRANSFEMORAL (N/A) INTRAOPERATIVE TRANSESOPHAGEAL ECHOCARDIOGRAM (N/A) Subjective: No complaints  Objective: Vital signs in last 24 hours: Temp:  [97.9 F (36.6 C)-98.8 F (37.1 C)] 98.8 F (37.1 C) (12/16 1532) Pulse Rate:  [61-93] 65 (12/16 1611) Cardiac Rhythm:  [-] Normal sinus rhythm (12/16 0800) Resp:  [12-26] 20 (12/16 1100) BP: (91-155)/(31-91) 91/31 mmHg (12/16 1611) SpO2:  [92 %-99 %] 96 % (12/16 1611) Arterial Line BP: (95-173)/(42-89) 130/45 mmHg (12/16 0900) Weight:  [159.4 kg (351 lb 6.6 oz)] 159.4 kg (351 lb 6.6 oz) (12/16 0545)  Hemodynamic parameters for last 24 hours: PAP: (32-72)/(6-40) 42/28 mmHg CO:  [5.9 L/min-7.3 L/min] 6 L/min CI:  [2.4 L/min/m2-3 L/min/m2] 2.5 L/min/m2  Intake/Output from previous day: 12/15 0701 - 12/16 0700 In: 2948.4 [P.O.:100; I.V.:2448.4; IV Piggyback:400] Out: 1425 [Urine:1425] Intake/Output this shift: Total I/O In: 785.3 [P.O.:720; I.V.:15.3; IV Piggyback:50] Out: 1000 [Urine:1000]  General appearance: alert and cooperative Neurologic: intact Heart: regular rate and rhythm, S1, S2 normal, no murmur, click, rub or gallop Lungs: clear to auscultation bilaterally Abdomen: soft, non-tender; bowel sounds normal; no masses,  no organomegaly Extremities: edema mild Wound: right groin incision ok  Lab Results:  Recent Labs  12/12/14 1100 12/12/14 1143 12/13/14 0400  WBC 9.9  --  8.4  HGB 10.7* 11.6* 9.3*  HCT 34.2* 34.0* 29.4*  PLT 162  --  166   BMET:  Recent Labs  12/12/14 1018 12/12/14 1143 12/13/14 0400  NA 136* 137 143  [name] 4.5 4.0 4.0  CL 96  --  102  CO2  --   --  33*  GLUCOSE 202* 199* 92  BUN 14  --  10  CREATININE 0.70  --  0.79  CALCIUM  --   --  8.8    PT/INR:  Recent Labs  12/12/14 1100  LABPROT 15.1  INR 1.18   ABG    Component Value Date/Time   PHART 7.293* 12/12/2014 1233   HCO3 33.0* 12/12/2014 1233   TCO2  35 12/12/2014 1233   ACIDBASEDEF 1.0 03/17/2013 0804   O2SAT 93.0 12/12/2014 1233   CBG (last 3)   Recent Labs  12/13/14 0759 12/13/14 0911 12/13/14 1014  GLUCAP 103* 114* 134*    Assessment/Plan: S/P Procedure(s) (LRB): TRANSCATHETER AORTIC VALVE REPLACEMENT, TRANSFEMORAL (N/A) INTRAOPERATIVE TRANSESOPHAGEAL ECHOCARDIOGRAM (N/A)  She is hemodynamically stable  OOB, IS  2D echo today   LOS: 1 day    [name],[name] 12/13/2014

## 2014-12-13 NOTE — Progress Notes (Signed)
POD # 1 TAVR  Resting comfortably  BP 101/38 mmHg  Pulse 65  Temp(Src) 98.8 F (37.1 C) (Oral)  Resp 20  Ht 5' (1.524 m)  Wt 351 lb 6.6 oz (159.4 kg)  BMI 68.63 kg/m2  SpO2 96%  LMP 07/26/2009   Intake/Output Summary (Last 24 hours) at 12/13/14 1756 Last data filed at 12/13/14 1545  Gross per 24 hour  Intake 1606.5 ml  Output   2035 ml  Net -428.5 ml    CBG Ok

## 2014-12-14 LAB — TYPE AND SCREEN
ABO/RH(D): A POS
Antibody Screen: NEGATIVE
UNIT DIVISION: 0
Unit division: 0

## 2014-12-14 LAB — CBC
HEMATOCRIT: 34.3 % — AB (ref 36.0–46.0)
HEMOGLOBIN: 10.9 g/dL — AB (ref 12.0–15.0)
MCH: 27 pg (ref 26.0–34.0)
MCHC: 31.8 g/dL (ref 30.0–36.0)
MCV: 85.1 fL (ref 78.0–100.0)
Platelets: 125 10*3/uL — ABNORMAL LOW (ref 150–400)
RBC: 4.03 MIL/uL (ref 3.87–5.11)
RDW: 14.4 % (ref 11.5–15.5)
WBC: 8 10*3/uL (ref 4.0–10.5)

## 2014-12-14 LAB — GLUCOSE, CAPILLARY
GLUCOSE-CAPILLARY: 183 mg/dL — AB (ref 70–99)
GLUCOSE-CAPILLARY: 116 mg/dL — AB (ref 70–99)
Glucose-Capillary: 118 mg/dL — ABNORMAL HIGH (ref 70–99)
Glucose-Capillary: 123 mg/dL — ABNORMAL HIGH (ref 70–99)
Glucose-Capillary: 141 mg/dL — ABNORMAL HIGH (ref 70–99)
Glucose-Capillary: 149 mg/dL — ABNORMAL HIGH (ref 70–99)

## 2014-12-14 LAB — BASIC METABOLIC PANEL WITH GFR
Anion gap: 9 (ref 5–15)
BUN: 11 mg/dL (ref 6–23)
CO2: 35 meq/L — ABNORMAL HIGH (ref 19–32)
Calcium: 9.2 mg/dL (ref 8.4–10.5)
Chloride: 97 meq/L (ref 96–112)
Creatinine, Ser: 0.84 mg/dL (ref 0.50–1.10)
GFR calc Af Amer: 89 mL/min — ABNORMAL LOW
GFR calc non Af Amer: 77 mL/min — ABNORMAL LOW
Glucose, Bld: 126 mg/dL — ABNORMAL HIGH (ref 70–99)
Potassium: 4 meq/L (ref 3.7–5.3)
Sodium: 141 meq/L (ref 137–147)

## 2014-12-14 NOTE — Progress Notes (Signed)
Pt transferred to 2W03 via wheelchair on telemetry monitor and on 2L [place] O2. Pt tolerated well, and VSS. Pt helped to bed in new room and received by Lenn Cal, RN.

## 2014-12-14 NOTE — Progress Notes (Signed)
    Subjective:  Short of breath with walking, but comfortable at rest. No chest pain. Right groin sore. No other complaints.   Objective:  Vital Signs in the last 24 hours: Temp:  [98 F (36.7 C)-98.8 F (37.1 C)] 98 F (36.7 C) (12/17 1121) Pulse Rate:  [65-85] 73 (12/17 1200) BP: (91-124)/(31-91) 104/52 mmHg (12/17 1200) SpO2:  [94 %-99 %] 97 % (12/17 1200) Weight:  [350 lb 1.5 oz (158.8 kg)] 350 lb 1.5 oz (158.8 kg) (12/17 0500)  Intake/Output from previous day: 12/16 0701 - 12/17 0700 In: 1195.3 [P.O.:960; I.V.:15.3; IV Piggyback:100] Out: 2050 [Urine:2050]  Physical Exam: Pt is alert and oriented, morbidly obese woman in NAD HEENT: normal Neck: JVP - normal Lungs: CTA bilaterally CV: RRR with 2/6 systolic ejection murmur at the LSB Abd: soft, NT, Positive BS, no hepatomegaly Ext: no C/C/E, distal pulses intact and equal, bilateral groin sites clear Skin: warm/dry no rash   Lab Results:  Recent Labs  12/13/14 0400 12/14/14 0520  WBC 8.4 8.0  HGB 9.3* 10.9*  PLT 166 125*    Recent Labs  12/13/14 0400 12/14/14 0520  NA 143 141  K 4.0 4.0  CL 102 97  CO2 33* 35*  GLUCOSE 92 126*  BUN 10 11  CREATININE 0.79 0.84   No results for input(s): TROPONINI in the last 72 hours.  Invalid input(s): CK, MB  Cardiac Studies: 2D Echo: Study Conclusions  - Left ventricle: The cavity size was normal. There was mild concentric hypertrophy. Systolic function was vigorous. The estimated ejection fraction was in the range of 65% to 70%. Features are consistent with a pseudonormal left ventricular filling pattern, with concomitant abnormal relaxation and increased filling pressure (grade 2 diastolic dysfunction). - Aortic valve: S/P TAVR. Stent valve well seated. Leaflets are not well visualized. A prosthesis was present and functioning normally. There was no significant regurgitation. - Mitral valve: Calcified annulus. - Left atrium: The atrium was  moderately dilated.  Tele: Sinus rhythm 75 bpm  Assessment/Plan:  1. Acute on chronic diastolic heart failure, improving after TAVR, oral furosemide restarted, appears euvolemic on exam 2. S/P TAVR POD #2 - echo reviewed and normal post-op valve function noted 3. COPD - stable 4. Type 2 DM - CBG's reviewed. Continue same regimen (glipizide and SSI) 5. HTN - BP well controlled  Overall stable on current Rx. Tx 2W stepdown bed. Anticipate d/c home next 24-48 hours. On ASA/Plavix/beta-bloker/statin.   Sherren Mocha, M.D. 12/14/2014, 1:26 PM

## 2014-12-15 LAB — GLUCOSE, CAPILLARY
GLUCOSE-CAPILLARY: 132 mg/dL — AB (ref 70–99)
GLUCOSE-CAPILLARY: 85 mg/dL (ref 70–99)
GLUCOSE-CAPILLARY: 148 mg/dL — AB (ref 70–99)
Glucose-Capillary: 148 mg/dL — ABNORMAL HIGH (ref 70–99)
Glucose-Capillary: 124 mg/dL — ABNORMAL HIGH (ref 70–99)

## 2014-12-15 MED ORDER — LOSARTAN POTASSIUM 50 MG PO TABS
50.0000 mg | ORAL_TABLET | Freq: Every day | ORAL | Status: DC
  Administered 2014-12-15 – 2014-12-16 (×2): 50 mg via ORAL
  Filled 2014-12-15 (×2): qty 1

## 2014-12-15 MED ORDER — LOSARTAN POTASSIUM 50 MG PO TABS: 50.0000 mg | ORAL_TABLET | Freq: Every day | ORAL | Status: DC

## 2014-12-15 NOTE — Progress Notes (Signed)
SATURATION QUALIFICATIONS: (This note is used to comply with regulatory documentation for home oxygen)  Patient Saturations on Room Air at Rest = 92%  Patient Saturations on Room Air while Ambulating = 80%  Patient Saturations on 3 Liters of oxygen while Ambulating = 86-92%  Please briefly explain why patient needs home oxygen:Desats without O2 to low 80's.  Thanks. [place] [phone] (pager)

## 2014-12-15 NOTE — Progress Notes (Addendum)
SUBJECTIVE: No chest pain. Breathing as improved but still dyspneic when walking. NO dyspnea at rest.   BP 129/45 mmHg  Pulse 70  Temp(Src) 98.4 F (36.9 C) (Oral)  Resp 18  Ht 5' (1.524 m)  Wt 348 lb 8.8 oz (158.1 kg)  BMI 68.07 kg/m2  SpO2 97%  LMP 07/26/2009  Intake/Output Summary (Last 24 hours) at 12/15/14 1013 Last data filed at 12/14/14 1751  Gross per 24 hour  Intake    540 ml  Output    800 ml  Net   -260 ml    PHYSICAL EXAM General: Well developed, well nourished, in no acute distress. Alert and oriented x 3.  Psych:  Good affect, responds appropriately Neck: No JVD. No masses noted.  Lungs: Clear bilaterally with no wheezes or rhonci noted.  Heart: RRR with no murmurs noted. Abdomen: Bowel sounds are present. Soft, non-tender.  Extremities: No lower extremity edema.   LABS: Basic Metabolic Panel:  Recent Labs  12/13/14 0400 12/14/14 0520  NA 143 141  K 4.0 4.0  CL 102 97  CO2 33* 35*  GLUCOSE 92 126*  BUN 10 11  CREATININE 0.79 0.84  CALCIUM 8.8 9.2  MG 1.9  --    CBC:  Recent Labs  12/13/14 0400 12/14/14 0520  WBC 8.4 8.0  HGB 9.3* 10.9*  HCT 29.4* 34.3*  MCV 85.0 85.1  PLT 166 125*   Current Meds: . acetaminophen  1,000 mg Oral 4 times per day   Or  . acetaminophen (TYLENOL) oral liquid 160 mg/5 mL  1,000 mg Per Tube 4 times per day  . antiseptic oral rinse  7 mL Mouth Rinse BID  . aspirin EC  81 mg Oral Daily  . atorvastatin  80 mg Oral q1800  . budesonide-formoterol  2 puff Inhalation BID  . citalopram  20 mg Oral Daily  . clopidogrel  75 mg Oral Daily  . ezetimibe  10 mg Oral Daily  . furosemide  40 mg Oral BID  . gabapentin  300 mg Oral BID  . glipiZIDE  5 mg Oral QAC breakfast  . insulin aspart  0-24 Units Subcutaneous 6 times per day  . losartan  100 mg Oral Daily  . metoprolol tartrate  25 mg Oral BID  . pantoprazole  40 mg Oral Daily   Echo 12/13/14: Left ventricle: The cavity size was normal. There was  mild concentric hypertrophy. Systolic function was vigorous. The estimated ejection fraction was in the range of 65% to 70%. Features are consistent with a pseudonormal left ventricular filling pattern, with concomitant abnormal relaxation and increased filling pressure (grade 2 diastolic dysfunction). - Aortic valve: S/P TAVR. Stent valve well seated. Leaflets are not well visualized. A prosthesis was present and functioning normally. There was no significant regurgitation. - Mitral valve: Calcified annulus. - Left atrium: The atrium was moderately dilated.   ASSESSMENT AND PLAN:  1. Acute on chronic diastolic heart failure: Volume status stable. Continue Lasix.   2. Severe AS: S/P TAVR POD #3. Echo reviewed and normal post-op valve function noted  3. COPD:  Stable. Continue inhalers.   4. Type 2 DM: Continue current regimen.   5. HTN: BP well controlled   Will ambulate today. PT consult. Likely discharge home in am. She has a 2 week f/u arranged with Dr. Cyndia Bent on 12/27/14. I will see her back in my office at 4pm on January 22nd. This will need to be arranged.   MCALHANY,CHRISTOPHER  12/18/201510:13 AM

## 2014-12-15 NOTE — Evaluation (Signed)
Physical Therapy Evaluation Patient Details Name: [name] MRN: [id number] DOB: 1961-02-12 Today's Date: 12/15/2014   History of Present Illness  Pt admit for TAVR via transfemoral area.    Clinical Impression  Pt admitted with above diagnosis. Pt currently without functional limitations and is independent with ambulation.  Pt does not need further skilled PT as she is at her baseline and has all equipment.  Needs home O2.  D/C PT.       Follow Up Recommendations No PT follow up    Equipment Recommendations  None recommended by PT    Recommendations for Other Services       Precautions / Restrictions Precautions Precautions: Fall Restrictions Weight Bearing Restrictions: No      Mobility  Bed Mobility Overal bed mobility: Independent                Transfers Overall transfer level: Independent                  Ambulation/Gait Ambulation/Gait assistance: Independent Ambulation Distance (Feet): 350 Feet Assistive device: None Gait Pattern/deviations: Step-through pattern;Decreased stride length;Wide base of support   Gait velocity interpretation: <1.8 ft/sec, indicative of risk for recurrent falls General Gait Details: Pt ambulates without assistive device with good balance.  No LOB.      Pt reports she is at baseline.  O2 sats 86%-94% on 3LO2 with ambulation.    Stairs            Wheelchair Mobility    Modified Rankin (Stroke Patients Only)       Balance Overall balance assessment: Needs assistance;History of Falls         Standing balance support: No upper extremity supported;During functional activity Standing balance-Leahy Scale: Good Standing balance comment: No problems with balance statically or dynamically with min challenges.                              Pertinent Vitals/Pain Pain Assessment: No/denies pain  Desat to 86% on 3LO2.    Home Living Family/patient expects to be discharged to:: Private  residence Living Arrangements: Spouse/significant other;Children Available Help at Discharge: Family;Available 24 hours/day Type of Home: House Home Access: Stairs to enter Entrance Stairs-Rails: Right Entrance Stairs-Number of Steps: 3 Home Layout: One level Home Equipment: Walker - standard;Walker - 2 wheels;Crutches;Bedside commode;Tub bench;Adaptive equipment Additional Comments: Pt husband laid off and will be home to help if needed    Prior Function Level of Independence: Independent               Hand Dominance   Dominant Hand: Right    Extremity/Trunk Assessment   Upper Extremity Assessment: Defer to OT evaluation           Lower Extremity Assessment: Generalized weakness      Cervical / Trunk Assessment: Normal  Communication   Communication: No difficulties  Cognition Arousal/Alertness: Awake/alert Behavior During Therapy: WFL for tasks assessed/performed Overall Cognitive Status: Within Functional Limits for tasks assessed                      General Comments      Exercises        Assessment/Plan    PT Assessment Patent does not need any further PT services  PT Diagnosis Generalized weakness   PT Problem List Decreased activity tolerance;Decreased balance;Decreased mobility;Decreased knowledge of use of DME;Decreased safety awareness;Decreased knowledge of precautions  PT Treatment Interventions  Therapeutic activities;Therapeutic exercise;Functional mobility training;Gait training;Patient/family education   PT Goals (Current goals can be found in the Care Plan section) Acute Rehab PT Goals Patient Stated Goal: to gohome PT Goal Formulation: All assessment and education complete, DC therapy    Frequency     Barriers to discharge        Co-evaluation               End of Session Equipment Utilized During Treatment: Gait belt;Oxygen Activity Tolerance: Patient limited by fatigue Patient left: with call bell/phone within  reach;in bed Nurse Communication: Mobility status         Time: 1535-1600 PT Time Calculation (min) (ACUTE ONLY): 25 min   Charges:   PT Evaluation $Initial PT Evaluation Tier I: 1 Procedure PT Treatments $Gait Training: 8-22 mins   PT G CodesDenice Paradise 12-19-2014, 4:28 PM Gurpreet Mikhail,PT Acute Rehabilitation [phone] (pager)

## 2014-12-15 NOTE — Progress Notes (Addendum)
[id number]       [name],[name] [id number]             [phone]      3 Days Post-Op Procedure(s) (LRB): TRANSCATHETER AORTIC VALVE REPLACEMENT, TRANSFEMORAL (N/A) INTRAOPERATIVE TRANSESOPHAGEAL ECHOCARDIOGRAM (N/A) Subjective: Feels ok, no specific c/c  Objective: Vital signs in last 24 hours: Temp:  [98 F (36.7 C)-98.5 F (36.9 C)] 98.4 F (36.9 C) (12/18 0420) Pulse Rate:  [69-85] 69 (12/18 0420) Cardiac Rhythm:  [-] Normal sinus rhythm (12/17 2016) Resp:  [18-20] 18 (12/18 0420) BP: (78-118)/(25-59) 89/25 mmHg (12/18 0420) SpO2:  [95 %-100 %] 98 % (12/18 0420) Weight:  [348 lb 8.8 oz (158.1 kg)] 348 lb 8.8 oz (158.1 kg) (12/18 0420)  Hemodynamic parameters for last 24 hours:    Intake/Output from previous day: 12/17 0701 - 12/18 0700 In: 880 [P.O.:880] Out: 1475 [Urine:1475] Intake/Output this shift:    General appearance: alert, cooperative and no distress Heart: regular rate and rhythm and do not hear a murmur but heart sounds are distant Lungs: clear to auscultation bilaterally Abdomen: soft, nontender Extremities: minor edema Wound: incis ok, no redness  Lab Results:  Recent Labs  12/13/14 0400 12/14/14 0520  WBC 8.4 8.0  HGB 9.3* 10.9*  HCT 29.4* 34.3*  PLT 166 125*   BMET:  Recent Labs  12/13/14 0400 12/14/14 0520  NA 143 141  K 4.0 4.0  CL 102 97  CO2 33* 35*  GLUCOSE 92 126*  BUN 10 11  CREATININE 0.79 0.84  CALCIUM 8.8 9.2    PT/INR:  Recent Labs  12/12/14 1100  LABPROT 15.1  INR 1.18   ABG    Component Value Date/Time   PHART 7.293* 12/12/2014 1233   HCO3 33.0* 12/12/2014 1233   TCO2 35 12/12/2014 1233   ACIDBASEDEF 1.0 03/17/2013 0804   O2SAT 93.0 12/12/2014 1233   CBG (last 3)   Recent Labs  12/14/14 2025 12/14/14 2343 12/15/14 0418  GLUCAP 149* 118* 148*    Meds Scheduled Meds: . acetaminophen  1,000 mg Oral 4 times per day   Or  . acetaminophen (TYLENOL) oral liquid 160 mg/5 mL   1,000 mg Per Tube 4 times per day  . antiseptic oral rinse  7 mL Mouth Rinse BID  . aspirin EC  81 mg Oral Daily  . atorvastatin  80 mg Oral q1800  . budesonide-formoterol  2 puff Inhalation BID  . citalopram  20 mg Oral Daily  . clopidogrel  75 mg Oral Daily  . ezetimibe  10 mg Oral Daily  . furosemide  40 mg Oral BID  . gabapentin  300 mg Oral BID  . glipiZIDE  5 mg Oral QAC breakfast  . insulin aspart  0-24 Units Subcutaneous 6 times per day  . losartan  100 mg Oral Daily  . metoprolol tartrate  25 mg Oral BID  . pantoprazole  40 mg Oral Daily   Continuous Infusions:  PRN Meds:.albuterol, metoprolol, morphine injection, ondansetron (ZOFRAN) IV, oxyCODONE, traMADol  Xrays No results found.  Assessment/Plan: S/P Procedure(s) (LRB): TRANSCATHETER AORTIC VALVE REPLACEMENT, TRANSFEMORAL (N/A) INTRAOPERATIVE TRANSESOPHAGEAL ECHOCARDIOGRAM (N/A)  1 good overall progress 2 hemodyn stable in sinus rhythm 3 sugars adequately controlled 4 no new labs 5 wean O2 off    LOS: 3 days    [name],[name] E 12/15/2014   Chart reviewed, patient examined, agree with above. She has been walking and feels better. She was on oxygen at home  and will need to stay on that for her severe COPD. She should be able to go home. Her groin incision looks ok. She needs to keep dry gauze over it to prevent maceration from her panniculus hanging on it. I need to see her in the office in 2 weeks to check the incision.

## 2014-12-15 NOTE — Progress Notes (Signed)
Spoke with Dr. Julianne Handler regarding patients trend for blood pressure to drop in the afternoons. I told him I had staggered her bp meds this am and he was good with that decision. He made a medication change to her Cozaar. Will continue to monitor. Pt updated

## 2014-12-16 DIAGNOSIS — I5033 Acute on chronic diastolic (congestive) heart failure: Secondary | ICD-10-CM | POA: Insufficient documentation

## 2014-12-16 DIAGNOSIS — I152 Hypertension secondary to endocrine disorders: Secondary | ICD-10-CM | POA: Insufficient documentation

## 2014-12-16 DIAGNOSIS — E1159 Type 2 diabetes mellitus with other circulatory complications: Secondary | ICD-10-CM | POA: Insufficient documentation

## 2014-12-16 DIAGNOSIS — Z953 Presence of xenogenic heart valve: Secondary | ICD-10-CM | POA: Insufficient documentation

## 2014-12-16 DIAGNOSIS — Z954 Presence of other heart-valve replacement: Secondary | ICD-10-CM

## 2014-12-16 DIAGNOSIS — I10 Essential (primary) hypertension: Secondary | ICD-10-CM

## 2014-12-16 LAB — BASIC METABOLIC PANEL
ANION GAP: 11 (ref 5–15)
BUN: 13 mg/dL (ref 6–23)
CALCIUM: 9.1 mg/dL (ref 8.4–10.5)
CO2: 33 mEq/L — ABNORMAL HIGH (ref 19–32)
Chloride: 95 mEq/L — ABNORMAL LOW (ref 96–112)
Creatinine, Ser: 0.92 mg/dL (ref 0.50–1.10)
GFR calc Af Amer: 80 mL/min — ABNORMAL LOW (ref >90)
GFR calc non Af Amer: 69 mL/min — ABNORMAL LOW (ref >90)
Glucose, Bld: 124 mg/dL — ABNORMAL HIGH (ref 70–99)
Potassium: 3.9 mEq/L (ref 3.7–5.3)
SODIUM: 139 meq/L (ref 137–147)

## 2014-12-16 LAB — CBC
HEMATOCRIT: 31.7 % — AB (ref 36.0–46.0)
Hemoglobin: 9.9 g/dL — ABNORMAL LOW (ref 12.0–15.0)
MCH: 26.7 pg (ref 26.0–34.0)
MCHC: 31.2 g/dL (ref 30.0–36.0)
MCV: 85.4 fL (ref 78.0–100.0)
Platelets: 146 10*3/uL — ABNORMAL LOW (ref 150–400)
RBC: 3.71 MIL/uL — AB (ref 3.87–5.11)
RDW: 14.3 % (ref 11.5–15.5)
WBC: 8.4 10*3/uL (ref 4.0–10.5)

## 2014-12-16 LAB — GLUCOSE, CAPILLARY
GLUCOSE-CAPILLARY: 190 mg/dL — AB (ref 70–99)
Glucose-Capillary: 125 mg/dL — ABNORMAL HIGH (ref 70–99)
Glucose-Capillary: 125 mg/dL — ABNORMAL HIGH (ref 70–99)
Glucose-Capillary: 119 mg/dL — ABNORMAL HIGH (ref 70–99)

## 2014-12-16 MED ORDER — OXYCODONE HCL 5 MG PO TABS: 5.0000 mg | ORAL_TABLET | ORAL | Status: DC | PRN

## 2014-12-16 MED ORDER — OXYCODONE HCL 5 MG PO TABS
5.0000 mg | ORAL_TABLET | ORAL | Status: DC | PRN
Start: 2014-12-16 — End: 2014-12-27

## 2014-12-16 MED ORDER — OXYCODONE HCL 5 MG PO TABS
5.0000 mg | ORAL_TABLET | ORAL | Status: DC | PRN
Start: 2014-12-16 — End: 2014-12-16

## 2014-12-16 MED ORDER — MAGNESIUM HYDROXIDE 400 MG/5ML PO SUSP: 15.0000 mL | Freq: Every day | ORAL | Status: DC

## 2014-12-16 MED ORDER — METOPROLOL TARTRATE 25 MG PO TABS
25.0000 mg | ORAL_TABLET | Freq: Two times a day (BID) | ORAL | Status: DC
Start: 2014-12-16 — End: 2015-01-24

## 2014-12-16 MED ORDER — LOSARTAN POTASSIUM 50 MG PO TABS: 50.0000 mg | ORAL_TABLET | Freq: Every day | ORAL | Status: DC

## 2014-12-16 MED ORDER — ASPIRIN 81 MG PO TBEC: 81.0000 mg | DELAYED_RELEASE_TABLET | Freq: Every day | ORAL | Status: DC

## 2014-12-16 MED ORDER — METOPROLOL TARTRATE 25 MG PO TABS: 25.0000 mg | ORAL_TABLET | Freq: Two times a day (BID) | ORAL | Status: DC

## 2014-12-16 NOTE — Progress Notes (Signed)
Pt A&O x4; pt discharge education and instructions completed with pt; pt voices understanding and denies any questions. Pt IV and telemetry removed; pt incision to right groin clean, dry and intact, no s/s of infection or drainage; pt discharge home with spouse to transport her home. Pt handed her prescription for losartan, metoprolol and oxycodone. Pt transported off unit via wheelchair with belongings to the side. Francis Gaines Thu Baggett RN.

## 2014-12-16 NOTE — Discharge Instructions (Signed)
Activity: 1.May walk up steps                2.No lifting more than ten pounds for two weeks.                 3.No driving for two weeks.                4.Stop any activity that causes chest pain, shortness of breath, dizziness, sweating or excessive weakness.                5.Avoid straining.                6.Continue with your breathing exercises daily.  Diet: Diabetic diet and Low fat, Low salt diet  Wound Care: May shower.  Clean wounds with mild soap and water daily. Contact the office at [phone] if any problems arise. Please keep dry gauze on right groin incision to prevent from getting moist

## 2014-12-16 NOTE — Discharge Summary (Signed)
Physician Discharge Summary       [address]       [place] [address]             [phone]    Patient ID: LORRAYNE ISMAEL MRN: [id number] DOB/AGE: 1961-12-24 53 y.o.  Admit date: 12/12/2014 Discharge date: 12/16/2014  Admission Diagnoses: 1. Severe aortic stenosis 2. History of acute on chronic diastolic CHF 3. History of essential hypertension 4. History of hyperlipidemia 5. History of CAD (s/p MI) 6. History of DM 7. History of COPD  8. History of OSA  9. History of GERD 10. History of PE  Discharge Diagnoses:  1. Severe aortic stenosis 2. History of acute on chronic diastolic CHF 3. History of essential hypertension 4. History of hyperlipidemia 5. History of CAD (s/p MI) 6. History of DM 7. History of COPD  8. History of OSA  9. History of GERD 10. History of PE 11. Anemia 12. Mild thrombocytopenia  Procedure (s):   Transcatheter Aortic Valve Replacement - Right Transfemoral Approach Edwards Sapien 3 Transcatheter Heart Valve (size 26 mm, model # 9600TFX, serial # [id number]) by Dr. Cyndia Bent on 12/12/2014.  History of Presenting Illness: The patient is a 53 year old woman with a history of morbid obesity, asthma and COPD, OSA, HTN, and hyperlipidemia who has know coronary disease who had a drug eluting stent placed in the distal RCA in 01/2009. She had no disease in the LAD or LCX at that time. She had a right total knee replacement in 01/2012 and developed shortness of breath postop. She was diagnosed with a PE and pneumonia and was treated with Coumadin for 6 months. She had a CTA of the chest on 08/09/2012 to follow up on the PE because she was having chronic shortness of breath. This showed no PE and the coumadin was stopped. In Feb 2014 she was complaining of severe dyspnea with minimal exertion and a CTA of the chest was repeated and showed no PE. She had a stress myoview that showed possible ischemia. An echo showed severe AS with  a mean gradient of 40 mm Hg and a peak of 57 mm Hg. The AVA was 0.74 cm2. LV function was normal. Cardiac cath on 03/17/2013 showed a patent stent in the RCA and otherwise mild non-obstructive disease. A TEE was done that showed moderate AS with a mean gradient of 29 mm Hg. The AVA by planimetry was 1.2-1.4 cm. Her most recent echo on 08/10/2014 shows normal LV function with severe AS with a mean gradient of 50 mm Hg and a peak of 77 mm Hg. She was seen by Dr. Angelena Form complaining of a several week history of of worsening dyspnea with exertion. She says she can only take a few steps before she gets short of breath and has to sit down. She can usually walk with a cane but has been using a wheelchair recently. She also reports some chest pains and dizziness. She underwent a cath on 09/14/2014 which showed a peak to peak gradient across the aortic valve of 46 mm Hg. The RCA had some ostial stenosis estimated at 70-80% followed by a 40% proximal stenosis. The distal stented segment is patent with no restenosis. There was mild pulmonary HTN with a PAP of 42/28 with a CI of 2.1. A TEE was done to further evaluate the aortic valve and it showed a functionally bicuspid valve with severe stenoI think she clearly has severe, symptomatic aortic stenosis with a relatively small  annulus and has recently progressive shortness of breath with minimal activity. Her CHF is complicated by morbid obesity, COPD and OSA, and severe obstructive and restrictive lung disease. Gated cardiac CT shows annular measurements suitable for a 26 mm Sapient XT valve. There is severe circumferential calcification of the upper portions of the sinuses of Valsalva continuing into the sinotubular junction and proximal ascending aorta. The abdominal and pelvic CT show suitable pelvic arterial access. Her PFT's show a severe obstructive defect and evidence of a moderate restrictive defect as well as a moderate reduction in diffusion capacity. I think TAVR  would be the best option for her and her recent CT scans show that she would be a candidate for a 26 mm Sapient XT valve via a transfemoral route. 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 Angelena Form estimated at 70%. We have discussed reviewed her cath films and discussed this and feel that this can be observed for now.  During the course of the patient's preoperative work up they have been evaluated comprehensively by a multidisciplinary team of specialists coordinated through the [facility] in the [facility]. They have been demonstrated to suffer from symptomatic severe aortic stenosis as noted above. The patient has been counseled extensively as to the relative risks and benefits of all options for the treatment of severe aortic stenosis including long term medical therapy, conventional surgery for aortic valve replacement, and transcatheter aortic valve replacement. The patient has been independently evaluated by two cardiac surgeons including myself and Dr. Roxy Manns, and they are felt to be at high risk for conventional surgical aortic valve replacement based upon a predicted risk of mortality using the Society of Thoracic Surgeons risk calculator of 5.3% and both of [place] feel that the patient would be a poor candidate for conventional surgery (predicted risk of mortality >15% and/or predicted risk of permanent morbidity >50%) because of comorbidities including morbid obesity, oxygen-dependent severe COPD and restrictive lung disease and OSA. She also has a relatively small annulus and would require an aortic root replacement to place an adequate sized valve with conventional open surgery.  Based upon review of all of the patient's preoperative diagnostic tests they are felt to be candidate for transcatheter aortic valve replacement using the transfemoral approach as an alternative to high risk conventional surgery.   Following the decision to proceed  with transcatheter aortic valve replacement, a discussion has been held regarding what types of management strategies would be attempted intraoperatively in the event of life-threatening complications, including whether or not the patient would be considered a candidate for the use of cardiopulmonary bypass and/or conversion to open sternotomy for attempted surgical intervention. The patient has been advised of a variety of complications that might develop including but not limited to risks of death, stroke, paravalvular leak, aortic dissection or other major vascular complications, aortic annulus rupture, device embolization, cardiac rupture or perforation, mitral regurgitation, acute myocardial infarction, arrhythmia, heart block or bradycardia requiring permanent pacemaker placement, congestive heart failure, respiratory failure, renal failure, pneumonia, infection, other late complications related to structural valve deterioration or migration, or other complications that might ultimately cause a temporary or permanent loss of functional independence or other long term morbidity. The patient provides full informed consent for the procedure as described and all questions were answered.sis and mild regurgitation. She underwent TAVR via transfemoral approach on 12/12/2014.  Brief Hospital Course:  She has remained afebrile and hemodynamically stable. She is maintaining sinus rhythm on  Lopressor 25 mg bid, Cozaar 50 daily, and Plavix 75 daily. She is requiring 3 liters of oxygen via [place]. She has a history of COPD and OSA and uses oxygen at home. She is tolerating a diet. Her right femoral wound is clean and dry. There is no drainage and no sign of infection. 2D echo done 12/16 showed LVEF 65-70%, no AI, no MS or MR, and no pericardial effusion. She was seen by myself and cardiology and is felt surgically stable for discharge today.    Latest Vital Signs: Blood pressure 134/53, pulse 72, temperature 97.9 F  (36.6 C), temperature source Oral, resp. rate 18, height 5' (1.524 m), weight 348 lb (157.852 kg), last menstrual period 07/26/2009, SpO2 98 %.  Physical Exam: Cardiovascular: RRR, distant heart sounds but no murmur Pulmonary: Clear to auscultation bilaterally; no rales, wheezes, or rhonchi. Abdomen: Soft, non tender, bowel sounds present. Extremities: Trace ilateral lower extremity edema. Wounds: Right groin wound is clean and dry. No erythema or signs of infection.  Discharge Condition:Stable  Recent laboratory studies:  Lab Results  Component Value Date   WBC 8.4 12/16/2014   HGB 9.9* 12/16/2014   HCT 31.7* 12/16/2014   MCV 85.4 12/16/2014   PLT 146* 12/16/2014   Lab Results  Component Value Date   NA 139 12/16/2014   K 3.9 12/16/2014   CL 95* 12/16/2014   CO2 33* 12/16/2014   CREATININE 0.92 12/16/2014   GLUCOSE 124* 12/16/2014      Diagnostic Studies: Dg Chest Port 1 View  12/13/2014   CLINICAL DATA:  Status post transcatheter aortic valve replacement ; previous since a previous history of asthma-COPD and episode of pulmonary embolism, diabetes  EXAM: PORTABLE CHEST - 1 VIEW  COMPARISON:  Portable chest x-ray of December 12, 2014  FINDINGS: The lungs are well-expanded. The left hemidiaphragm remains mildly elevated. The left lung base is better demonstrated today. The interstitial markings of both lungs remain mildly increased. The cardiac silhouette is top-normal in size. The central pulmonary vascularity remains engorged but cephalization has decreased the Swan-Ganz catheter tip projects in the region of the proximal pulmonary artery trunk.  IMPRESSION: 1. Slight interval decrease in pulmonary interstitial edema with improved aeration of the left lung base. 2. Persistent pulmonary interstitial edema bilaterally. 3. Positioning of the Swan-Ganz catheter is somewhat low. Correlation with the adequacy of the function of the catheter is needed. Advancement may be needed. 4.  These results were called by telephone at the time of interpretation on 12/13/2014 at 7:55 am to Charolette Child, RN,, who verbally acknowledged these results.   Electronically Signed   By: David  Martinique   On: 12/13/2014 07:53    Discharge Medications:   Medication List    TAKE these medications        albuterol 108 (90 BASE) MCG/ACT inhaler  Commonly known as:  PROVENTIL HFA;VENTOLIN HFA  Inhale 2 puffs into the lungs every 4 (four) hours as needed for wheezing or shortness of breath.     albuterol (2.5 MG/3ML) 0.083% nebulizer solution  Commonly known as:  PROVENTIL  Take 2.5 mg by nebulization every 4 (four) hours as needed for wheezing or shortness of breath.     aspirin 81 MG EC tablet  Take 1 tablet (81 mg total) by mouth daily.     atorvastatin 80 MG tablet  Commonly known as:  LIPITOR  Take 80 mg by mouth daily.     budesonide-formoterol 160-4.5 MCG/ACT inhaler  Commonly known as:  SYMBICORT  Inhale 2 puffs into the lungs 2 (two) times daily.     citalopram 20 MG tablet  Commonly known as:  CELEXA  Take 20 mg by mouth daily.     clopidogrel 75 MG tablet  Commonly known as:  PLAVIX  Take 75 mg by mouth every morning.     doxycycline 100 MG capsule  Commonly known as:  VIBRAMYCIN  Take 1 capsule (100 mg total) by mouth 2 (two) times daily.     ezetimibe 10 MG tablet  Commonly known as:  ZETIA  Take 10 mg by mouth daily.     furosemide 40 MG tablet  Commonly known as:  LASIX  Take 40 mg by mouth 2 (two) times daily.     gabapentin 300 MG capsule  Commonly known as:  NEURONTIN  Take 300 mg by mouth 2 (two) times daily.     glipiZIDE 5 MG tablet  Commonly known as:  GLUCOTROL  Take 5 mg by mouth daily before supper.     losartan 50 MG tablet  Commonly known as:  COZAAR  Take 1 tablet (50 mg total) by mouth daily with breakfast.     MAGNESIUM PO  Take 1-2 tablets by mouth daily as needed (constipation).     metoprolol tartrate 25 MG tablet  Commonly  known as:  LOPRESSOR  Take 1 tablet (25 mg total) by mouth 2 (two) times daily.     oxyCODONE 5 MG immediate release tablet  Commonly known as:  Oxy IR/ROXICODONE  Take 1-2 tablets (5-10 mg total) by mouth every 4 (four) hours as needed for severe pain.     OXYGEN  Inhale 3 L into the lungs daily.     potassium chloride SA 20 MEQ tablet  Commonly known as:  K-DUR,KLOR-CON  Take 40 mEq by mouth daily.        Follow Up Appointments:     Follow-up Information    Follow up with Gaye Pollack, MD On 12/27/2014.   Specialty:  Cardiothoracic Surgery   Why:  Appointment time is at 4:00 pm   Contact information:   [address] [place] [address] [phone]       Follow up with Lauree Chandler, MD On 12/19/2014.   Specialty:  Cardiology   Why:  4 pm. Our office will call you to confirm appointment date   Contact information:   [address]  [address] [place] [address] [phone]       Signed: Lars Pinks MPA-C 12/16/2014, 11:51 AM

## 2014-12-16 NOTE — Progress Notes (Signed)
Primary Cardiologist: Val Riles CV Surgeon: Corbin Ade MD  Cardiology Specific Problem List: 1. Severe AS 2. 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 Acute on Chronic Diastolic CHF  Subjective:    Feels great hoping to go home today. No pain. Breathing better.   Objective:   Temp:  [98.1 F (36.7 C)-98.3 F (36.8 C)] 98.3 F (36.8 C) (12/19 0450) Pulse Rate:  [73-82] 81 (12/19 0450) Resp:  [18-20] 19 (12/19 0450) BP: (121-135)/(41-47) 123/47 mmHg (12/19 0450) SpO2:  [98 %] 98 % (12/19 0450) Weight:  [348 lb (157.852 kg)] 348 lb (157.852 kg) (12/19 0450) Last BM Date: 12/14/14  Filed Weights   12/14/14 0500 12/15/14 0420 12/16/14 0450  Weight: 350 lb 1.5 oz (158.8 kg) 348 lb 8.8 oz (158.1 kg) 348 lb (157.852 kg)    Intake/Output Summary (Last 24 hours) at 12/16/14 0853 Last data filed at 12/15/14 0900  Gross per 24 hour  Intake    360 ml  Output      0 ml  Net    360 ml    Telemetry: NSR  Exam:  General: No acute distress. Morbidly obese  HEENT: Conjunctiva and lids normal, oropharynx clear.  Lungs: Clear to auscultation, nonlabored.  Cardiac: No elevated JVP or bruits. RRR, distant heart sounds, no gallop or rub.   Abdomen: Normoactive bowel sounds, nontender, nondistended.  Extremities: No pitting edema, distal pulses full.  Neuropsychiatric: Alert and oriented x3, affect appropriate.   Lab Results:  Basic Metabolic Panel:  Recent Labs Lab 12/13/14 0400 12/14/14 0520 12/16/14 0330  NA 143 141 139  K 4.0 4.0 3.9  CL 102 97 95*  CO2 33* 35* 33*  GLUCOSE 92 126* 124*  BUN 10 11 13   CREATININE 0.79 0.84 0.92  CALCIUM 8.8 9.2 9.1  MG 1.9  --   --     CBC:  Recent Labs Lab 12/13/14 0400 12/14/14 0520 12/16/14 0330  WBC 8.4 8.0 8.4  HGB 9.3* 10.9* 9.9*  HCT 29.4* 34.3* 31.7*  MCV 85.0 85.1 85.4  PLT 166 125* 146*   Coagulation:  Recent Labs Lab 12/12/14 1100  INR 1.18   ECG: NSR    Medications:   Scheduled Medications: .  acetaminophen  1,000 mg Oral 4 times per day   Or  . acetaminophen (TYLENOL) oral liquid 160 mg/5 mL  1,000 mg Per Tube 4 times per day  . antiseptic oral rinse  7 mL Mouth Rinse BID  . aspirin EC  81 mg Oral Daily  . atorvastatin  80 mg Oral q1800  . budesonide-formoterol  2 puff Inhalation BID  . citalopram  20 mg Oral Daily  . clopidogrel  75 mg Oral Daily  . ezetimibe  10 mg Oral Daily  . furosemide  40 mg Oral BID  . gabapentin  300 mg Oral BID  . glipiZIDE  5 mg Oral QAC breakfast  . insulin aspart  0-24 Units Subcutaneous 6 times per day  . losartan  50 mg Oral Daily  . metoprolol tartrate  25 mg Oral BID  . pantoprazole  40 mg Oral Daily   PRN Medications: albuterol, metoprolol, morphine injection, ondansetron (ZOFRAN) IV, oxyCODONE, traMADol   Assessment and Plan:   1. Acute on Chronic Diastolic CHF: Continues on lasix 40 mg BID. She is positive 433 cc this am. No evidence of edema on exam this am. She is morbidly obese. Mild DOE but improved continues inhalers  2. S/P TAVR: POD #4: Doing very well. Has been  up walking in the halls. No pain. Follow up appts already made for surgery. Dr. Lonna Cobb to see in January. Appt will be arranged.   3. Hypertension: Well controlled.  Continues on losartan 50 mg daily. Continues on metoprolol.   4. COPD: Breathing status is stable.   [name]. [place] NP [facility]  12/16/2014, 8:53 AM   The patient was seen, examined and discussed with Jory Sims, NP and I agree with the above. The patient will be discharged today with an early follow up with Dr Angelena Form. BP and HR controlled, she appears almost euvolemic, we will discharge on Lasix 40 mg PO BID.  [name] 12/16/2014

## 2014-12-16 NOTE — Progress Notes (Addendum)
      [address]       [place] [address]             [phone]        4 Days Post-Op Procedure(s) (LRB): TRANSCATHETER AORTIC VALVE REPLACEMENT, TRANSFEMORAL (N/A) INTRAOPERATIVE TRANSESOPHAGEAL ECHOCARDIOGRAM (N/A)  Subjective: Patient just waking up this am.  Objective: Vital signs in last 24 hours: Temp:  [98.1 F (36.7 C)-98.3 F (36.8 C)] 98.3 F (36.8 C) (12/19 0450) Pulse Rate:  [70-82] 81 (12/19 0450) Cardiac Rhythm:  [-] Normal sinus rhythm (12/18 2121) Resp:  [18-20] 19 (12/19 0450) BP: (121-135)/(41-47) 123/47 mmHg (12/19 0450) SpO2:  [97 %-98 %] 98 % (12/19 0450) Weight:  [348 lb (157.852 kg)] 348 lb (157.852 kg) (12/19 0450)  Current Weight  12/16/14 348 lb (157.852 kg)      Intake/Output from previous day: 12/18 0701 - 12/19 0700 In: 360 [P.O.:360] Out: -    Physical Exam:  Cardiovascular: RRR, distant heart sounds but no murmur Pulmonary: Clear to auscultation bilaterally; no rales, wheezes, or rhonchi. Abdomen: Soft, non tender, bowel sounds present. Extremities: Trace ilateral lower extremity edema. Wounds: Right groin wound is clean and dry.  No erythema or signs of infection.  Lab Results: CBC: Recent Labs  12/14/14 0520 12/16/14 0330  WBC 8.0 8.4  HGB 10.9* 9.9*  HCT 34.3* 31.7*  PLT 125* 146*   BMET:  Recent Labs  12/14/14 0520 12/16/14 0330  NA 141 139  K 4.0 3.9  CL 97 95*  CO2 35* 33*  GLUCOSE 126* 124*  BUN 11 13  CREATININE 0.84 0.92  CALCIUM 9.2 9.1    PT/INR:  Lab Results  Component Value Date   INR 1.18 12/12/2014   INR 1.02 12/08/2014   INR 1.0 09/07/2014   ABG:  INR: Will add last result for INR, ABG once components are confirmed Will add last 4 CBG results once components are confirmed  Assessment/Plan:  1. CV - SR in the 60's. On Lopressor 25 bid, Cozaar 50 daily, and Plavix 75 daily. 2.  Pulmonary - History of COPD and on oxygen prior to surgery. On 3 liters of oxygen via  [place]. 3. Thrombocytopenia-platelets up to 146,000 4.  Acute blood loss anemia - H and H 9.9 and 31.7 5. DM-CBGs 148/190/125. On Glipizide as taken pre op. Pre op HGA1C 6.7 6. Management per Dr. Julian Reil al; will discharge home  ZIMMERMAN,DONIELLE MPA-C 12/16/2014,7:41 AM

## 2014-12-27 ENCOUNTER — Telehealth: Payer: Self-pay | Admitting: Cardiovascular Disease

## 2014-12-27 ENCOUNTER — Encounter: Payer: Self-pay | Admitting: Surgery

## 2014-12-27 ENCOUNTER — Ambulatory Visit (INDEPENDENT_AMBULATORY_CARE_PROVIDER_SITE_OTHER): Payer: Medicaid Other | Admitting: Surgery

## 2014-12-27 VITALS — BP 179/80 | HR 100 | Temp 97.5°F | Resp 20 | Ht 60.0 in

## 2014-12-27 DIAGNOSIS — Z954 Presence of other heart-valve replacement: Secondary | ICD-10-CM

## 2014-12-27 DIAGNOSIS — I2510 Atherosclerotic heart disease of native coronary artery without angina pectoris: Secondary | ICD-10-CM

## 2014-12-27 DIAGNOSIS — Z6841 Body Mass Index (BMI) 40.0 and over, adult: Secondary | ICD-10-CM

## 2014-12-27 DIAGNOSIS — Z952 Presence of prosthetic heart valve: Secondary | ICD-10-CM

## 2014-12-27 DIAGNOSIS — I350 Nonrheumatic aortic (valve) stenosis: Secondary | ICD-10-CM

## 2014-12-27 NOTE — Progress Notes (Signed)
HPI:  Patient returns for routine postoperative follow-up having undergone right transfemoral TAVR  on 12/12/2014. The patient's early postoperative recovery while in the hospital was notable for an uncomplicated postop course. Since hospital discharge the patient reports that she has felt ok. She still has some exertional dyspnea. She doesn't feel that great today but thinks that she is getting a cold. She has not been taking her Lopressor that she was started on postop because she can't afford it. She says she has not been taking her losartan because of some difficulty with Medicaid and she can't afford to buy it on her own. Her main complaint is lack of taste and smell since going home.   Current Outpatient Prescriptions  Medication Sig Dispense Refill  . albuterol (PROVENTIL HFA;VENTOLIN HFA) 108 (90 BASE) MCG/ACT inhaler Inhale 2 puffs into the lungs every 4 (four) hours as needed for wheezing or shortness of breath.     Marland Kitchen albuterol (PROVENTIL) (2.5 MG/3ML) 0.083% nebulizer solution Take 2.5 mg by nebulization every 4 (four) hours as needed for wheezing or shortness of breath.     Marland Kitchen aspirin EC 81 MG EC tablet Take 1 tablet (81 mg total) by mouth daily.    Marland Kitchen atorvastatin (LIPITOR) 80 MG tablet Take 80 mg by mouth daily.    . budesonide-formoterol (SYMBICORT) 160-4.5 MCG/ACT inhaler Inhale 2 puffs into the lungs 2 (two) times daily.    . citalopram (CELEXA) 20 MG tablet Take 20 mg by mouth daily.    . clopidogrel (PLAVIX) 75 MG tablet Take 75 mg by mouth every morning.     . ezetimibe (ZETIA) 10 MG tablet Take 10 mg by mouth daily.    . furosemide (LASIX) 40 MG tablet Take 40 mg by mouth 2 (two) times daily.    Marland Kitchen gabapentin (NEURONTIN) 300 MG capsule Take 300 mg by mouth 2 (two) times daily.     Marland Kitchen glipiZIDE (GLUCOTROL) 5 MG tablet Take 5 mg by mouth daily before supper.     . losartan (COZAAR) 50 MG tablet Take 1 tablet (50 mg total) by mouth daily with breakfast. 30 tablet 1  .  MAGNESIUM PO Take 1-2 tablets by mouth daily as needed (constipation).     . OXYGEN-HELIUM IN Inhale 3 L into the lungs daily.     . potassium chloride SA (K-DUR,KLOR-CON) 20 MEQ tablet Take 40 mEq by mouth daily.    . metoprolol tartrate (LOPRESSOR) 25 MG tablet Take 1 tablet (25 mg total) by mouth 2 (two) times daily. (Patient not taking: Reported on 12/27/2014) 60 tablet 1   No current facility-administered medications for this visit.    Physical Exam: BP 179/80 mmHg  Pulse 100  Temp(Src) 97.5 F (36.4 C) (Oral)  Resp 20  Ht 5' (1.524 m)  SpO2 98%  LMP 07/26/2009 She looks chronically ill, unchanged from preop Lungs are clear Cardiac exam shows a regular rate and rhythm with normal heart sounds. No murmur. Mild lower extremity edema The right groin incision is healing well. Left groin access sites ok.   Impression:  She is stable after TAVR but still has exertional dyspnea probably related to her morbid obesity and severe lung disease. I think this will improve if she watches her diet, gets physical activity and loses weight. Her BP is high but she has not been taking her meds due to financial difficulty and I discussed the importance of good BP control for her. I don't know why she would  lose her sense of taste and smell postop. She is not on any new meds. A stroke might cause that but she has no other signs of stroke. I would watch it for now.   Plan:  She is going to see Dr. Angelena Form in 2 weeks with an echo for her 1 month follow up.

## 2014-12-27 NOTE — Telephone Encounter (Signed)
Agree. cdm 

## 2014-12-27 NOTE — Telephone Encounter (Signed)
New message        Pt has not been able to taste or smell anything since having procedure 2 weeks ago   is this normal

## 2014-12-27 NOTE — Telephone Encounter (Signed)
Called patient back. Patient is unable to smell or taste for two weeks since her procedure on 12/12/14. Reviewed medication list with Elberta Leatherwood the pharmacist. Neither her oxycodone or doxycycline, her new medications, have any side effect of these symptoms. Informed patient it maybe due to anesthesiology, but not sure. Patient has appointment with Dr. Cyndia Bent today at 4:00 pm. Advised her to ask the doctor at her appointment today. Patient agreed to this plan and verbalized understanding.

## 2014-12-28 ENCOUNTER — Other Ambulatory Visit: Payer: Self-pay | Admitting: *Deleted

## 2014-12-28 DIAGNOSIS — I350 Nonrheumatic aortic (valve) stenosis: Secondary | ICD-10-CM

## 2014-12-30 ENCOUNTER — Encounter (HOSPITAL_COMMUNITY): Payer: Self-pay | Admitting: *Deleted

## 2014-12-30 ENCOUNTER — Emergency Department (HOSPITAL_COMMUNITY)
Admission: EM | Admit: 2014-12-30 | Discharge: 2014-12-30 | Disposition: A | Discharge status: Home / Self Care | Payer: Medicaid Other | Attending: Emergency Medicine | Admitting: Emergency Medicine

## 2014-12-30 ENCOUNTER — Emergency Department (HOSPITAL_COMMUNITY): Payer: Medicaid Other

## 2014-12-30 DIAGNOSIS — Z7982 Long term (current) use of aspirin: Secondary | ICD-10-CM | POA: Diagnosis not present

## 2014-12-30 DIAGNOSIS — Z872 Personal history of diseases of the skin and subcutaneous tissue: Secondary | ICD-10-CM | POA: Insufficient documentation

## 2014-12-30 DIAGNOSIS — I5032 Chronic diastolic (congestive) heart failure: Secondary | ICD-10-CM | POA: Insufficient documentation

## 2014-12-30 DIAGNOSIS — Z7901 Long term (current) use of anticoagulants: Secondary | ICD-10-CM | POA: Diagnosis not present

## 2014-12-30 DIAGNOSIS — J441 Chronic obstructive pulmonary disease with (acute) exacerbation: Secondary | ICD-10-CM | POA: Diagnosis not present

## 2014-12-30 DIAGNOSIS — J159 Unspecified bacterial pneumonia: Secondary | ICD-10-CM | POA: Diagnosis not present

## 2014-12-30 DIAGNOSIS — I10 Essential (primary) hypertension: Secondary | ICD-10-CM | POA: Diagnosis not present

## 2014-12-30 DIAGNOSIS — R011 Cardiac murmur, unspecified: Secondary | ICD-10-CM | POA: Diagnosis not present

## 2014-12-30 DIAGNOSIS — I252 Old myocardial infarction: Secondary | ICD-10-CM | POA: Insufficient documentation

## 2014-12-30 DIAGNOSIS — Z79899 Other long term (current) drug therapy: Secondary | ICD-10-CM | POA: Insufficient documentation

## 2014-12-30 DIAGNOSIS — I2510 Atherosclerotic heart disease of native coronary artery without angina pectoris: Secondary | ICD-10-CM | POA: Diagnosis not present

## 2014-12-30 DIAGNOSIS — E780 Pure hypercholesterolemia: Secondary | ICD-10-CM | POA: Insufficient documentation

## 2014-12-30 DIAGNOSIS — J189 Pneumonia, unspecified organism: Secondary | ICD-10-CM

## 2014-12-30 DIAGNOSIS — R0602 Shortness of breath: Secondary | ICD-10-CM

## 2014-12-30 DIAGNOSIS — J029 Acute pharyngitis, unspecified: Secondary | ICD-10-CM | POA: Diagnosis present

## 2014-12-30 DIAGNOSIS — Z87891 Personal history of nicotine dependence: Secondary | ICD-10-CM | POA: Diagnosis not present

## 2014-12-30 DIAGNOSIS — Z8701 Personal history of pneumonia (recurrent): Secondary | ICD-10-CM | POA: Diagnosis not present

## 2014-12-30 DIAGNOSIS — Z86711 Personal history of pulmonary embolism: Secondary | ICD-10-CM | POA: Diagnosis not present

## 2014-12-30 DIAGNOSIS — E119 Type 2 diabetes mellitus without complications: Secondary | ICD-10-CM | POA: Diagnosis not present

## 2014-12-30 DIAGNOSIS — Z87442 Personal history of urinary calculi: Secondary | ICD-10-CM | POA: Insufficient documentation

## 2014-12-30 DIAGNOSIS — Z862 Personal history of diseases of the blood and blood-forming organs and certain disorders involving the immune mechanism: Secondary | ICD-10-CM | POA: Insufficient documentation

## 2014-12-30 LAB — RAPID STREP SCREEN (MED CTR MEBANE ONLY): Streptococcus, Group A Screen (Direct): NEGATIVE

## 2014-12-30 IMAGING — DX DG CHEST 2V
2 series · 2 of 2 positions shown · non-contrast
Comparison: Chest radiograph performed [DATE]

CLINICAL DATA: Acute onset of sore throat, chest congestion,
productive cough and worsening shortness of breath. Bilateral
otalgia and wheezing. Initial encounter.

EXAM:
CHEST  2 VIEW

[chest pa]
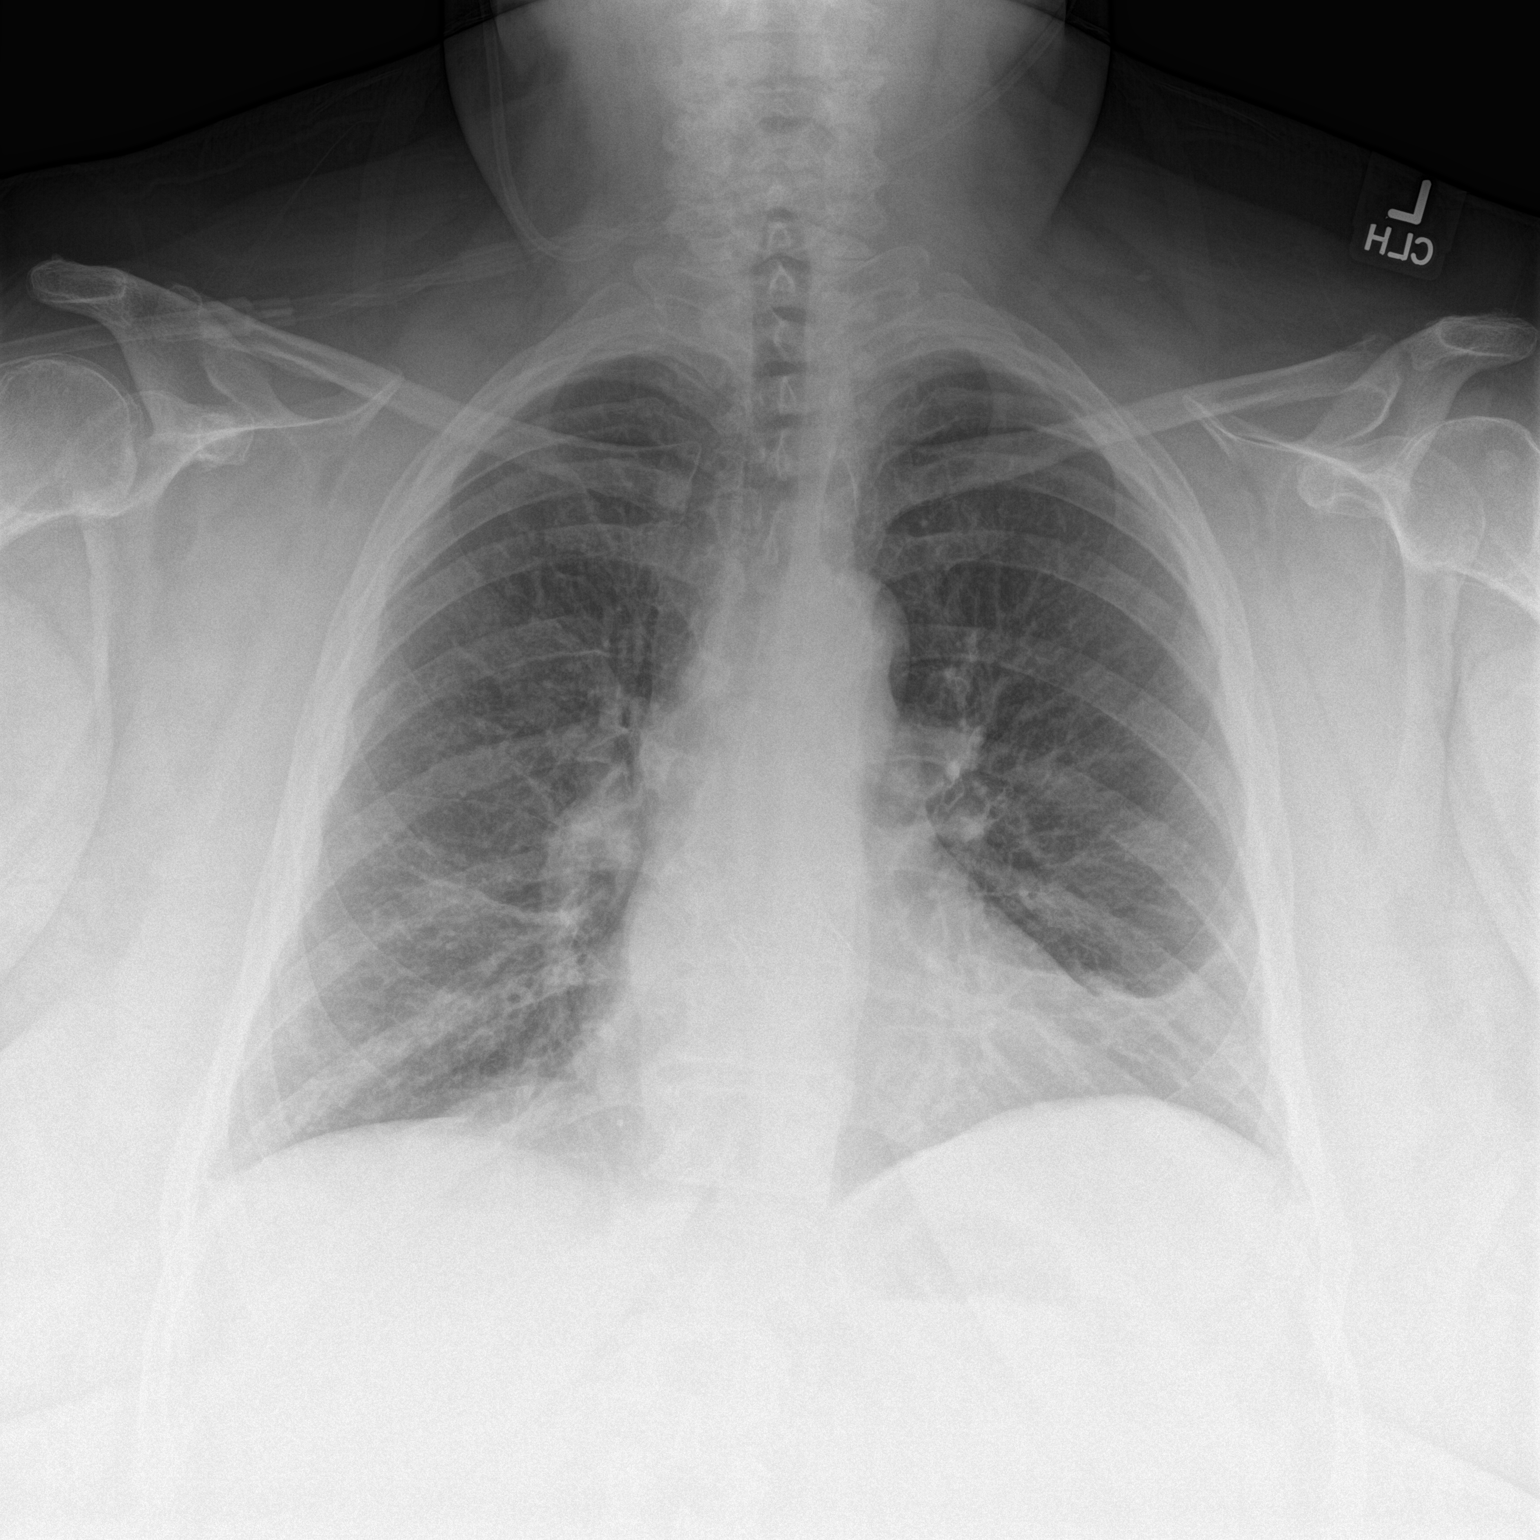

[chest lat]
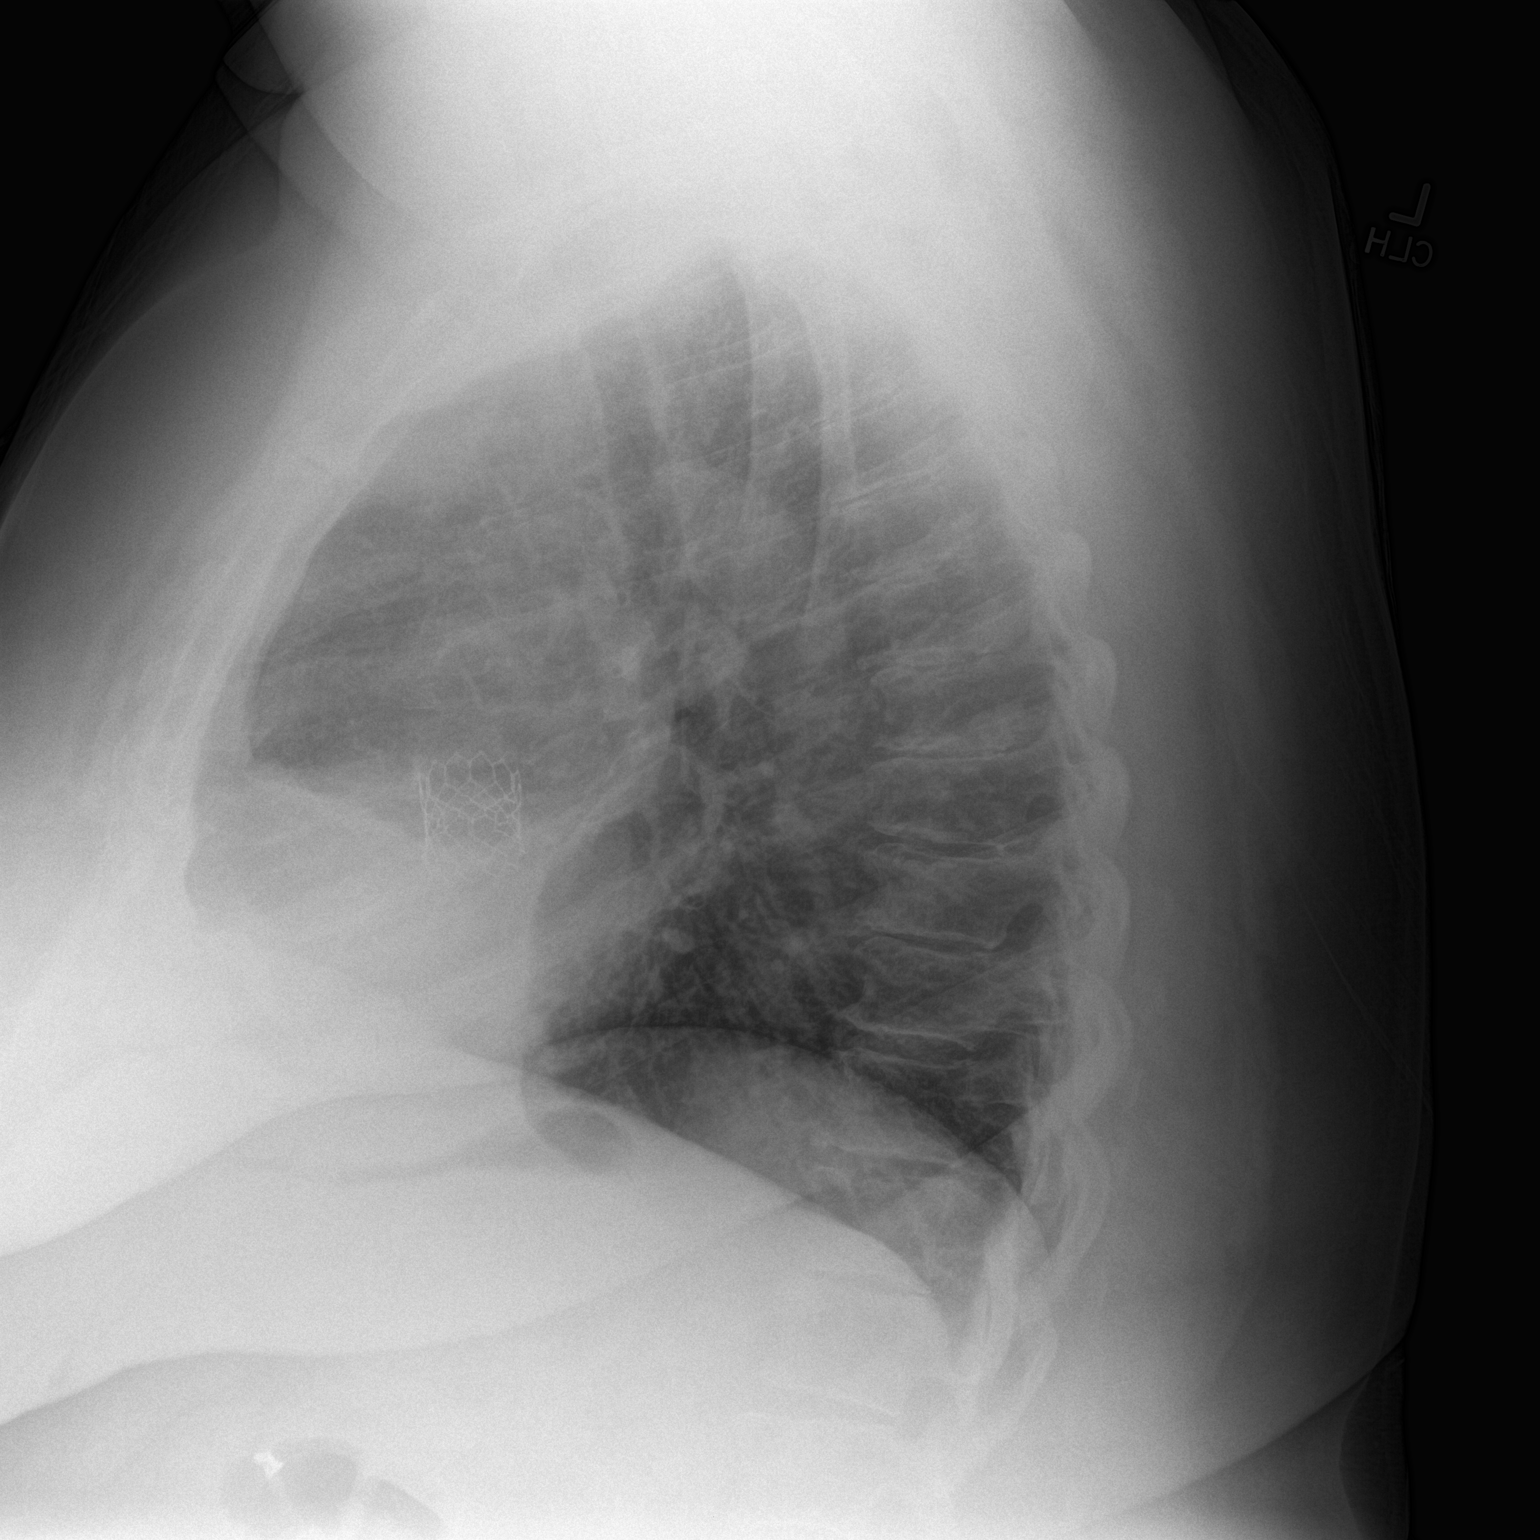

[2 of 2 positions shown; findings below may reference images not displayed]

FINDINGS: The lungs are well-aerated. Bibasilar airspace opacities may reflect
interstitial edema or mild pneumonia. Mild vascular congestion is
noted. There is no evidence of pleural effusion or pneumothorax.

The heart is normal in size; the mediastinal contour is within
normal limits. No acute osseous abnormalities are seen. Clips are
noted within the right upper quadrant, reflecting prior
cholecystectomy.
IMPRESSION: Bibasilar airspace opacities may reflect interstitial edema or mild
pneumonia. Mild vascular congestion noted.

## 2014-12-30 MED ORDER — PREDNISONE 10 MG PO TABS: 40.0000 mg | ORAL_TABLET | Freq: Every day | ORAL | Status: DC

## 2014-12-30 MED ORDER — LEVOFLOXACIN 750 MG PO TABS: 750.0000 mg | ORAL_TABLET | Freq: Every day | ORAL | Status: DC

## 2014-12-30 MED ORDER — IPRATROPIUM-ALBUTEROL 0.5-2.5 (3) MG/3ML IN SOLN
3.0000 mL | Freq: Once | RESPIRATORY_TRACT | Status: AC
  Administered 2014-12-30: 3 mL via RESPIRATORY_TRACT
  Filled 2014-12-30: qty 3

## 2014-12-30 MED ORDER — PREDNISONE 20 MG PO TABS
60.0000 mg | ORAL_TABLET | Freq: Once | ORAL | Status: AC
  Administered 2014-12-30: 60 mg via ORAL
  Filled 2014-12-30: qty 3

## 2014-12-30 MED ORDER — LEVOFLOXACIN 750 MG PO TABS
750.0000 mg | ORAL_TABLET | Freq: Once | ORAL | Status: AC
  Administered 2014-12-30: 750 mg via ORAL
  Filled 2014-12-30: qty 1

## 2014-12-30 NOTE — ED Provider Notes (Signed)
CSN: [id number]     Arrival date & time 12/30/14  0034 History   First MD Initiated Contact with Patient 12/30/14 0130     This chart was scribed for [name] Sorrow, MD by [name], ED Scribe. This patient was seen in room B17C/B17C and the patient's care was started 4:44 AM.   Chief Complaint  Patient presents with  . Sore Throat  . chest congestion    Patient is a 54 y.o. female presenting with pharyngitis. The history is provided by the patient. No language interpreter was used.  Sore Throat This is a new problem. The current episode started 12 to 24 hours ago. The problem occurs constantly. The problem has been gradually worsening. Associated symptoms include headaches and shortness of breath. Pertinent negatives include no chest pain and no abdominal pain. Nothing aggravates the symptoms. Nothing relieves the symptoms. She has tried nothing for the symptoms.    HPI Comments: [name] is a 54 y.o. female with a PMHx of HTN, asthma., COPD, GERD, DM, CAD, and MI, who presents to the Emergency Department complaining of constant sore throat onset today. She also reports chest congestion, productive cough, worsening SOB, bilateral otalgia, and wheezing. Symptoms have worsened over course of this evening. Pt is typically on 2 1/2 liters of oxygen at home but has had to increase to 3 liters. [name] has also increased albuterol use over course of sickness. No recent fever or chills. No known allergies to medications.  Past Medical History  Diagnosis Date  . Asthma   . Hypertension   . Hyperlipidemia   . GERD (gastroesophageal reflux disease)   . Depression   . Blood transfusion     2013[facility]  . Headache(784.0)     h/o migraines  . Pulmonary embolism 02/08/12    S/P RT TOTAL KNEE ON 02/03/12--ON 02/08/12--DEVELOPED ACUTE SOB AND CHEST PAIN--AND DIAGNOSED WITH  PULMONARY EMBOLUS AND PNEUMONIA  . Restless leg syndrome   . Myocardial infarction     PT THINKS SHE WAS DX WITH MI  AT THE TIME OF HEART STENTING  . CAD (coronary artery disease)     Cath 2010 with DES x 1 RCA-- PT'S CARDIOLOGIST IS DR. Von Ormy  . Uterine fibroid     NO PROBLEMS AT PRESENT FROM THE FIBROIDS-STATES SHE IS POST MENOPAUSAL-LAST MENSES 2010 EXCEPT FOR EPISODE THIS YR OF BLEEDING RELATED TO FIBROIDS.  Marland Kitchen Heart murmur   . Pneumonia 02/08/12    HOSPITALIZED AT [facility] WITH PNEUMONIA AND WITH PULMONARY EMBOLUS  . Diabetes mellitus DIAGNOSED IN2010    ORAL MEDS  . Arthritis     PAIN AND SEVERE OA LEFT KNEE ; S/P RIGHT TKA ON 02/03/12; HAS LOWER BACK PAIN-UNABLE TO STAND MORE THAN 10 MIN; ARTHRITIS "ALL OVER"  . Neck pain   . Weakness     BOTH HANDS - S/P BILATERAL CARPAL TUNNEL RELEASE--BUT STILL HAS WEAKNESS--OFTEN DROPS THINGS  . Shortness of breath     with exertion  . Eczema     on back  . Kidney stones   . Anemia   . COPD (chronic obstructive pulmonary disease)   . Sleep apnea     uses 3 liters O2 at night   . Aortic valve stenosis, severe   . Aortic stenosis   . Morbid obesity with body mass index of 60.0-69.9 in adult   . Chronic diastolic congestive heart failure    Past Surgical History  Procedure Laterality Date  . Tubal ligation    .  Carpal tunnel release      Bilateral  . Cholecystectomy    . Hernia repair    . Cardiac catheterization    . Knee arthroplasty  02/03/2012    Procedure: COMPUTER ASSISTED TOTAL KNEE ARTHROPLASTY;  Surgeon: Mcarthur Rossetti, MD;  Location: [facility];  Service: Orthopedics;  Laterality: Right;  Right total knee arthroplasty  . Coronary angioplasty      2010 has stent in place  . Total knee arthroplasty  09/10/2012    Procedure: TOTAL KNEE ARTHROPLASTY;  Surgeon: Mcarthur Rossetti, MD;  Location: [facility];  Service: Orthopedics;  Laterality: Left;  . Trigger finger release  09/10/2012    Procedure: RELEASE TRIGGER FINGER/A-1 PULLEY;  Surgeon: Mcarthur Rossetti, MD;  Location: [facility];  Service: Orthopedics;  Laterality: Right;  Right Ring  Finger  . Tee without cardioversion N/A 03/14/2013    Procedure: TRANSESOPHAGEAL ECHOCARDIOGRAM (TEE);  Surgeon: Lelon Perla, MD;  Location: [facility] ENDOSCOPY;  Service: Cardiovascular;  Laterality: N/A;  . Joint replacement      bil total knees  . Total knee revision Right 07/15/2013    Procedure: REVISION ARTHROPLASTY RIGHT KNEE;  Surgeon: Mcarthur Rossetti, MD;  Location: [facility];  Service: Orthopedics;  Laterality: Right;  . Cystoscopy w/ retrogrades Right 09/21/2013    Procedure: CYSTOSCOPY WITH RIGHT RETROGRADE PYELOGRAM RIGHT DOUBLE J STENT ;  Surgeon: Fredricka Bonine, MD;  Location: [facility];  Service: Urology;  Laterality: Right;  . Cystoscopy with ureteroscopy and stent placement Right 10/25/2013    Procedure: CYSTOSCOPY RIGHT URETEROSCOPY HOLMIUM LASER LITHO AND STENT PLACEMENT;  Surgeon: Fredricka Bonine, MD;  Location: [facility];  Service: Urology;  Laterality: Right;  . Tee without cardioversion N/A 11/14/2014    Procedure: TRANSESOPHAGEAL ECHOCARDIOGRAM (TEE);  Surgeon: Lelon Perla, MD;  Location: [facility] ENDOSCOPY;  Service: Cardiovascular;  Laterality: N/A;  . Left and right heart catheterization with coronary angiogram N/A 03/17/2013    Procedure: LEFT AND RIGHT HEART CATHETERIZATION WITH CORONARY ANGIOGRAM;  Surgeon: Burnell Blanks, MD;  Location: [facility] CATH LAB;  Service: Cardiovascular;  Laterality: N/A;  . Left and right heart catheterization with coronary/graft angiogram N/A 09/14/2014    Procedure: LEFT AND RIGHT HEART CATHETERIZATION WITH Beatrix Fetters;  Surgeon: Burnell Blanks, MD;  Location: [facility] CATH LAB;  Service: Cardiovascular;  Laterality: N/A;  . Transcatheter aortic valve replacement, transfemoral N/A 12/12/2014    Procedure: TRANSCATHETER AORTIC VALVE REPLACEMENT, TRANSFEMORAL;  Surgeon: Burnell Blanks, MD;  Location: [place];  Service: Cardiovascular;  Laterality: N/A;  . Intraoperative transesophageal echocardiogram N/A  12/12/2014    Procedure: INTRAOPERATIVE TRANSESOPHAGEAL ECHOCARDIOGRAM;  Surgeon: Burnell Blanks, MD;  Location: [facility] OR;  Service: Cardiovascular;  Laterality: N/A;   Family History  Problem Relation Age of Onset  . Breast cancer Mother   . COPD Father     smoked  . Cancer Brother     Sinus  . Emphysema Mother     smoked  . Asthma Father   . Heart disease Mother   . Heart disease Father    History  Substance Use Topics  . Smoking status: Former Smoker -- 1.50 packs/day for 30 years    Types: Cigarettes    Quit date: 12/29/2000  . Smokeless tobacco: Never Used  . Alcohol Use: Yes     Comment: rarely   OB History    No data available     Review of Systems  Constitutional: Negative for fever and chills.  HENT: Positive for congestion and ear pain.   Eyes: Negative for visual disturbance.  Respiratory: Positive for cough, shortness of breath and wheezing.   Cardiovascular: Negative for chest pain and leg swelling.  Gastrointestinal: Positive for nausea. Negative for vomiting, abdominal pain and diarrhea.  Genitourinary: Negative for dysuria.  Musculoskeletal: Negative for back pain.  Skin: Negative for rash.  Neurological: Positive for headaches.  Hematological: Does not bruise/bleed easily.  Psychiatric/Behavioral: Negative for confusion.      Allergies  Review of patient's allergies indicates no known allergies.  Home Medications   Prior to Admission medications   Medication Sig Start Date End Date Taking? Authorizing Provider  albuterol (PROVENTIL HFA;VENTOLIN HFA) 108 (90 BASE) MCG/ACT inhaler Inhale 2 puffs into the lungs every 4 (four) hours as needed for wheezing or shortness of breath.    Yes Historical Provider, MD  albuterol (PROVENTIL) (2.5 MG/3ML) 0.083% nebulizer solution Take 2.5 mg by nebulization every 4 (four) hours as needed for wheezing or shortness of breath.    Yes Historical Provider, MD  aspirin EC 81 MG EC tablet Take 1 tablet (81 mg  total) by mouth daily. 12/16/14  Yes Donielle Liston Alba, PA-C  atorvastatin (LIPITOR) 80 MG tablet Take 80 mg by mouth daily.   Yes Historical Provider, MD  budesonide-formoterol (SYMBICORT) 160-4.5 MCG/ACT inhaler Inhale 2 puffs into the lungs 2 (two) times daily.   Yes Historical Provider, MD  citalopram (CELEXA) 20 MG tablet Take 20 mg by mouth daily.   Yes Historical Provider, MD  clopidogrel (PLAVIX) 75 MG tablet Take 75 mg by mouth every morning.    Yes Historical Provider, MD  ezetimibe (ZETIA) 10 MG tablet Take 10 mg by mouth daily.   Yes Historical Provider, MD  furosemide (LASIX) 40 MG tablet Take 40 mg by mouth 2 (two) times daily.   Yes Historical Provider, MD  gabapentin (NEURONTIN) 300 MG capsule Take 300 mg by mouth 2 (two) times daily.    Yes Historical Provider, MD  glipiZIDE (GLUCOTROL) 5 MG tablet Take 5 mg by mouth daily before supper.    Yes Historical Provider, MD  losartan (COZAAR) 50 MG tablet Take 1 tablet (50 mg total) by mouth daily with breakfast. 12/16/14  Yes Donielle Liston Alba, PA-C  MAGNESIUM PO Take 1-2 tablets by mouth daily as needed (constipation).    Yes Historical Provider, MD  OXYGEN-HELIUM IN Inhale 3 L into the lungs daily.    Yes Historical Provider, MD  potassium chloride SA (K-DUR,KLOR-CON) 20 MEQ tablet Take 40 mEq by mouth daily.   Yes Historical Provider, MD  levofloxacin (LEVAQUIN) 750 MG tablet Take 1 tablet (750 mg total) by mouth daily. 12/30/14   [name] Sorrow, MD  metoprolol tartrate (LOPRESSOR) 25 MG tablet Take 1 tablet (25 mg total) by mouth 2 (two) times daily. 12/16/14   Donielle Liston Alba, PA-C  predniSONE (DELTASONE) 10 MG tablet Take 4 tablets (40 mg total) by mouth daily. 12/30/14   [name] Sorrow, MD   Triage Vitals: BP 117/50 mmHg  Pulse 88  Temp(Src) 98 F (36.7 C) (Oral)  Resp 23  Ht 5' (1.524 m)  Wt 348 lb (157.852 kg)  BMI 67.96 kg/m2  SpO2 99%  LMP 07/26/2009   Physical Exam  Constitutional: She is oriented to  person, place, and time. She appears well-developed and well-nourished. No distress.  HENT:  Head: Normocephalic and atraumatic.  Right Ear: Hearing, tympanic membrane, external ear and ear canal normal.  Left Ear: Hearing, tympanic membrane,  external ear and ear canal normal.  Mouth/Throat: Oropharynx is clear and moist.  Eyes: Conjunctivae and EOM are normal. Pupils are equal, round, and reactive to light.  Sclera clear  Neck: Normal range of motion.  Cardiovascular: Normal rate, regular rhythm and normal heart sounds.   No murmur heard. Pulmonary/Chest: Effort normal and breath sounds normal. No respiratory distress. She has no wheezes.  Rhonchi noted bilaterally  Abdominal: Soft. Bowel sounds are normal. She exhibits no distension. There is no tenderness.  Musculoskeletal: Normal range of motion. She exhibits no edema.  Neurological: She is alert and oriented to person, place, and time. No cranial nerve deficit. She exhibits normal muscle tone. Coordination normal.  Skin: Skin is warm and dry.  Psychiatric: She has a normal mood and affect. Judgment normal.  Nursing note and vitals reviewed.   ED Course  Procedures (including critical care time)  DIAGNOSTIC STUDIES: Oxygen Saturation is 96% on RA, Normal by my interpretation.    COORDINATION OF CARE: 4:44 AM-Discussed treatment plan with pt at bedside and pt agreed to plan.    Labs Review Labs Reviewed  RAPID STREP SCREEN  CULTURE, GROUP A STREP    Imaging Review Dg Chest 2 View  12/30/2014   CLINICAL DATA:  Acute onset of sore throat, chest congestion, productive cough and worsening shortness of breath. Bilateral otalgia and wheezing. Initial encounter.  EXAM: CHEST  2 VIEW  COMPARISON:  Chest radiograph performed 12/13/2014  FINDINGS: The lungs are well-aerated. Bibasilar airspace opacities may reflect interstitial edema or mild pneumonia. Mild vascular congestion is noted. There is no evidence of pleural effusion or  pneumothorax.  The heart is normal in size; the mediastinal contour is within normal limits. No acute osseous abnormalities are seen. Clips are noted within the right upper quadrant, reflecting prior cholecystectomy.  IMPRESSION: Bibasilar airspace opacities may reflect interstitial edema or mild pneumonia. Mild vascular congestion noted.   Electronically Signed   By: Garald Balding M.D.   On: 12/30/2014 02:27     EKG Interpretation None      MDM   Final diagnoses:  SOB (shortness of breath)  CAP (community acquired pneumonia)  COPD exacerbation    Patient's rapid strep was negative. Formal throat culture is pending. Chest x-ray raises concerns for possible pneumonia. Patient's symptoms could be consistent with that. Also there is evidence of some mild vascular congestion. The patient not in any extremities. Patient also has history of COPD. Patient did have wheezing subjectively. Patient will be started on Levaquin for the possible pneumonia. Patient will follow-up with her regular doctor. Patient has albuterol inhalers at home. Patient will also be continued on prednisone which was started here. Patient nontoxic no acute distress. Patient's already on oxygen. Oxygen levels here on 3 L of oxygen been in the upper 90s.  I personally performed the services described in this documentation, which was scribed in my presence. The recorded information has been reviewed and is accurate.    [name] Sorrow, MD 12/30/14 [phone]

## 2014-12-30 NOTE — ED Notes (Signed)
Patient presents with c/o sore throat and chest congestion  With productive cough  Patient on oxygen at home usually 2 1/2 liters but has increased it to 3 liters

## 2014-12-30 NOTE — Discharge Instructions (Signed)
Take antibiotic as directed for possible pneumonia. Use albuterol inhaler 2 puffs every 6 hours no matter what can use it more frequently if needed. Take the prednisone as directed. Make an appointment probably record Dr. Return for any new or worse symptoms. You should be improving over the next couple days.

## 2015-01-01 LAB — CULTURE, GROUP A STREP

## 2015-01-18 ENCOUNTER — Encounter: Payer: Self-pay | Admitting: *Deleted

## 2015-01-19 ENCOUNTER — Other Ambulatory Visit (HOSPITAL_COMMUNITY): Payer: Medicaid Other

## 2015-01-19 ENCOUNTER — Encounter: Payer: Medicaid Other | Admitting: Cardiovascular Disease

## 2015-01-24 ENCOUNTER — Other Ambulatory Visit: Payer: Self-pay | Admitting: *Deleted

## 2015-01-24 MED ORDER — METOPROLOL TARTRATE 25 MG PO TABS: 25.0000 mg | ORAL_TABLET | Freq: Two times a day (BID) | ORAL | Status: DC

## 2015-01-26 ENCOUNTER — Ambulatory Visit (INDEPENDENT_AMBULATORY_CARE_PROVIDER_SITE_OTHER): Payer: Medicaid Other | Admitting: Cardiovascular Disease

## 2015-01-26 ENCOUNTER — Ambulatory Visit (HOSPITAL_COMMUNITY): Payer: Medicaid Other | Attending: Cardiovascular Disease | Admitting: Radiology

## 2015-01-26 ENCOUNTER — Encounter: Payer: Self-pay | Admitting: Cardiovascular Disease

## 2015-01-26 VITALS — BP 132/68 | HR 73 | Ht 60.0 in | Wt 348.0 lb

## 2015-01-26 DIAGNOSIS — I10 Essential (primary) hypertension: Secondary | ICD-10-CM | POA: Diagnosis not present

## 2015-01-26 DIAGNOSIS — Z87891 Personal history of nicotine dependence: Secondary | ICD-10-CM | POA: Insufficient documentation

## 2015-01-26 DIAGNOSIS — E119 Type 2 diabetes mellitus without complications: Secondary | ICD-10-CM | POA: Insufficient documentation

## 2015-01-26 DIAGNOSIS — I2510 Atherosclerotic heart disease of native coronary artery without angina pectoris: Secondary | ICD-10-CM

## 2015-01-26 DIAGNOSIS — I350 Nonrheumatic aortic (valve) stenosis: Secondary | ICD-10-CM

## 2015-01-26 DIAGNOSIS — Z953 Presence of xenogenic heart valve: Secondary | ICD-10-CM

## 2015-01-26 DIAGNOSIS — E785 Hyperlipidemia, unspecified: Secondary | ICD-10-CM | POA: Insufficient documentation

## 2015-01-26 DIAGNOSIS — R0600 Dyspnea, unspecified: Secondary | ICD-10-CM

## 2015-01-26 DIAGNOSIS — Z954 Presence of other heart-valve replacement: Secondary | ICD-10-CM

## 2015-01-26 NOTE — Patient Instructions (Signed)
Your physician recommends that you schedule a follow-up appointment in: 4 months. Scheduled for May 24, 2015 at 2:45

## 2015-01-26 NOTE — Progress Notes (Signed)
History of Present Illness: 54 yo WF with history of severe aortic stenosis,  CAD, OSA, HTN, HLD, obesity, GERD, asthma, COPD, former tobacco abuse, depression here today for cardiac follow up. She had a cardiac cath in February 2010 and had a drug eluting stent (2.26mm Xience) placed in the distal RCA per Dr. Melvern Banker. Her LAD and Circumflex were free of disease. She had her right knee replaced in February 8,2013. She developed SOB on 02/08/12 and was found to have a PE and pneumonia. She was started on coumadin and treated for 6 months. CTA chest on 08/09/12 showed no evidence of PE so her coumadin was stopped. I saw her February 20,2014 and she c/o severe dyspnea with minimal exertion. I felt that this was multi-factorial. Chest CTA with no PE. I arranged a stress myoview which showed possible ischemia. Echo with severe AS, normal LV function. Cardiac cath on 03/17/13 with mild non-obstructive disease and patent stent RCA. TEE with moderate AS. She continued to complain of dyspnea with minimal exertion. Echo  08/10/14 with normal LV function, severe AS with mean gradient of 50 mmHg, peak gradient 77 mmHg. Cardiac cath 09/14/14 with 70% ostial RCA stenosis. No obstructive disease in the LAD or Circumflex. She was then evaluated for AVR and eventually had transcatheter AVR on 12/12/14 with a 26 mm Sapien 3 bioprosthetic valve. She did well following the procedure and was discharged home on 12/16/14. She was seen in f/u by Dr. Cyndia Bent 12/27/14 and was doing well other than lack of sense of taste and smell. No other stroke like symptoms. She had also been out of her metoprolol and Cozaar due to cost. She was seen in the ED 12/30/14 with cough, sore throat and treated for pneumonia.   She is here today for cardiac follow up.  She is feeling well. She is still coughing some but improving. No chest pain. She is more active. Some lower extremity edema but controlled with Lasix.   Echo today with no evidence of AI.  Valve seems to be working well with expected gradient. Normal LV function.  .  Primary Care Physician: Jana Hakim   Last Lipid Profile:Lipid Panel     Component Value Date/Time   CHOL 191 07/06/2014 1129   TRIG 106.0 07/06/2014 1129   HDL 53.50 07/06/2014 1129   CHOLHDL 4 07/06/2014 1129   VLDL 21.2 07/06/2014 1129   LDLCALC 116* 07/06/2014 1129     Past Medical History  Diagnosis Date  . Asthma   . Hypertension   . Hyperlipidemia   . GERD (gastroesophageal reflux disease)   . Depression   . Blood transfusion     2013[facility]  . Headache(784.0)     h/o migraines  . Pulmonary embolism 02/08/12    S/P RT TOTAL KNEE ON 02/03/12--ON 02/08/12--DEVELOPED ACUTE SOB AND CHEST PAIN--AND DIAGNOSED WITH  PULMONARY EMBOLUS AND PNEUMONIA  . Restless leg syndrome   . Myocardial infarction     PT THINKS SHE WAS DX WITH MI AT THE TIME OF HEART STENTING  . CAD (coronary artery disease)     Cath 2010 with DES x 1 RCA-- PT'S CARDIOLOGIST IS DR. Henderson  . Uterine fibroid     NO PROBLEMS AT PRESENT FROM THE FIBROIDS-STATES SHE IS POST MENOPAUSAL-LAST MENSES 2010 EXCEPT FOR EPISODE THIS YR OF BLEEDING RELATED TO FIBROIDS.  Marland Kitchen Heart murmur   . Pneumonia 02/08/12    HOSPITALIZED AT [facility] WITH PNEUMONIA AND WITH PULMONARY EMBOLUS  .  Diabetes mellitus DIAGNOSED IN2010    ORAL MEDS  . Arthritis     PAIN AND SEVERE OA LEFT KNEE ; S/P RIGHT TKA ON 02/03/12; HAS LOWER BACK PAIN-UNABLE TO STAND MORE THAN 10 MIN; ARTHRITIS "ALL OVER"  . Neck pain   . Weakness     BOTH HANDS - S/P BILATERAL CARPAL TUNNEL RELEASE--BUT STILL HAS WEAKNESS--OFTEN DROPS THINGS  . Shortness of breath     with exertion  . Eczema     on back  . Kidney stones   . Anemia   . COPD (chronic obstructive pulmonary disease)   . Sleep apnea     uses 3 liters O2 at night   . Aortic valve stenosis, severe   . Aortic stenosis   . Morbid obesity with body mass index of 60.0-69.9 in adult   . Chronic diastolic congestive heart  failure     Past Surgical History  Procedure Laterality Date  . Tubal ligation    . Carpal tunnel release      Bilateral  . Cholecystectomy    . Hernia repair    . Cardiac catheterization    . Knee arthroplasty  02/03/2012    Procedure: COMPUTER ASSISTED TOTAL KNEE ARTHROPLASTY;  Surgeon: Mcarthur Rossetti, MD;  Location: [place];  Service: Orthopedics;  Laterality: Right;  Right total knee arthroplasty  . Coronary angioplasty      2010 has stent in place  . Total knee arthroplasty  09/10/2012    Procedure: TOTAL KNEE ARTHROPLASTY;  Surgeon: Mcarthur Rossetti, MD;  Location: [facility];  Service: Orthopedics;  Laterality: Left;  . Trigger finger release  09/10/2012    Procedure: RELEASE TRIGGER FINGER/A-1 PULLEY;  Surgeon: Mcarthur Rossetti, MD;  Location: [facility];  Service: Orthopedics;  Laterality: Right;  Right Ring Finger  . Tee without cardioversion N/A 03/14/2013    Procedure: TRANSESOPHAGEAL ECHOCARDIOGRAM (TEE);  Surgeon: Lelon Perla, MD;  Location: [facility] ENDOSCOPY;  Service: Cardiovascular;  Laterality: N/A;  . Joint replacement      bil total knees  . Total knee revision Right 07/15/2013    Procedure: REVISION ARTHROPLASTY RIGHT KNEE;  Surgeon: Mcarthur Rossetti, MD;  Location: [facility];  Service: Orthopedics;  Laterality: Right;  . Cystoscopy w/ retrogrades Right 09/21/2013    Procedure: CYSTOSCOPY WITH RIGHT RETROGRADE PYELOGRAM RIGHT DOUBLE J STENT ;  Surgeon: Fredricka Bonine, MD;  Location: [facility];  Service: Urology;  Laterality: Right;  . Cystoscopy with ureteroscopy and stent placement Right 10/25/2013    Procedure: CYSTOSCOPY RIGHT URETEROSCOPY HOLMIUM LASER LITHO AND STENT PLACEMENT;  Surgeon: Fredricka Bonine, MD;  Location: [facility];  Service: Urology;  Laterality: Right;  . Tee without cardioversion N/A 11/14/2014    Procedure: TRANSESOPHAGEAL ECHOCARDIOGRAM (TEE);  Surgeon: Lelon Perla, MD;  Location: [facility] ENDOSCOPY;  Service: Cardiovascular;   Laterality: N/A;  . Left and right heart catheterization with coronary angiogram N/A 03/17/2013    Procedure: LEFT AND RIGHT HEART CATHETERIZATION WITH CORONARY ANGIOGRAM;  Surgeon: Burnell Blanks, MD;  Location: [facility] CATH LAB;  Service: Cardiovascular;  Laterality: N/A;  . Left and right heart catheterization with coronary/graft angiogram N/A 09/14/2014    Procedure: LEFT AND RIGHT HEART CATHETERIZATION WITH Beatrix Fetters;  Surgeon: Burnell Blanks, MD;  Location: [facility] CATH LAB;  Service: Cardiovascular;  Laterality: N/A;  . Transcatheter aortic valve replacement, transfemoral N/A 12/12/2014    Procedure: TRANSCATHETER AORTIC VALVE REPLACEMENT, TRANSFEMORAL;  Surgeon: Burnell Blanks, MD;  Location: [facility]  OR;  Service: Cardiovascular;  Laterality: N/A;  . Intraoperative transesophageal echocardiogram N/A 12/12/2014    Procedure: INTRAOPERATIVE TRANSESOPHAGEAL ECHOCARDIOGRAM;  Surgeon: Burnell Blanks, MD;  Location: [facility] OR;  Service: Cardiovascular;  Laterality: N/A;    Current Outpatient Prescriptions  Medication Sig Dispense Refill  . albuterol (PROVENTIL HFA;VENTOLIN HFA) 108 (90 BASE) MCG/ACT inhaler Inhale 2 puffs into the lungs every 4 (four) hours as needed for wheezing or shortness of breath.     Marland Kitchen albuterol (PROVENTIL) (2.5 MG/3ML) 0.083% nebulizer solution Take 2.5 mg by nebulization every 4 (four) hours as needed for wheezing or shortness of breath.     Marland Kitchen aspirin EC 81 MG EC tablet Take 1 tablet (81 mg total) by mouth daily.    Marland Kitchen atorvastatin (LIPITOR) 80 MG tablet Take 80 mg by mouth daily.    . budesonide-formoterol (SYMBICORT) 160-4.5 MCG/ACT inhaler Inhale 2 puffs into the lungs 2 (two) times daily.    . citalopram (CELEXA) 20 MG tablet Take 20 mg by mouth daily.    . clopidogrel (PLAVIX) 75 MG tablet Take 75 mg by mouth every morning.     . ezetimibe (ZETIA) 10 MG tablet Take 10 mg by mouth daily.    . furosemide (LASIX) 40 MG tablet Take 40 mg by  mouth 2 (two) times daily.    Marland Kitchen gabapentin (NEURONTIN) 300 MG capsule Take 300 mg by mouth 2 (two) times daily.     Marland Kitchen glipiZIDE (GLUCOTROL) 5 MG tablet Take 5 mg by mouth daily before supper.     . losartan (COZAAR) 50 MG tablet Take 1 tablet (50 mg total) by mouth daily with breakfast. 30 tablet 1  . MAGNESIUM PO Take 1-2 tablets by mouth daily as needed (constipation).     . metoprolol tartrate (LOPRESSOR) 25 MG tablet Take 1 tablet (25 mg total) by mouth 2 (two) times daily. 60 tablet 0  . OXYGEN-HELIUM IN Inhale 3 L into the lungs daily.     . potassium chloride SA (K-DUR,KLOR-CON) 20 MEQ tablet Take 40 mEq by mouth daily.     No current facility-administered medications for this visit.    No Known Allergies  History   Social History  . Marital Status: Married    Spouse Name: N/A    Number of Children: 2  . Years of Education: N/A   Occupational History  . Disabled    Social History Main Topics  . Smoking status: Former Smoker -- 1.50 packs/day for 30 years    Types: Cigarettes    Quit date: 12/29/2000  . Smokeless tobacco: Never Used  . Alcohol Use: Yes     Comment: rarely  . Drug Use: No  . Sexual Activity: Yes    Birth Control/ Protection: Surgical   Other Topics Concern  . Not on file   Social History Narrative    Family History  Problem Relation Age of Onset  . Breast cancer Mother   . COPD Father     smoked  . Cancer Brother     Sinus  . Emphysema Mother     smoked  . Asthma Father   . Heart disease Mother   . Heart disease Father     Review of Systems:  As stated in the HPI and otherwise negative.   BP 132/68 mmHg  Pulse 73  Ht 5' (1.524 m)  Wt 348 lb (157.852 kg)  BMI 67.96 kg/m2  SpO2 95%  LMP 07/26/2009  Physical Examination: General: Well developed, well nourished,  NAD HEENT: OP clear, mucus membranes moist SKIN: warm, dry. No rashes. Neuro: No focal deficits Musculoskeletal: Muscle strength 5/5 all ext Psychiatric: Mood and  affect normal Neck: No JVD, no carotid bruits, no thyromegaly, no lymphadenopathy. Lungs:Clear bilaterally, no wheezes, rhonci, crackles Cardiovascular: Regular rate and rhythm. Loud systolic murmur.  Abdomen:Soft. Bowel sounds present. Non-tender.  Extremities: No lower extremity edema. Pulses are 2 + in the bilateral DP/PT.  Cardiac cath 09/14/14: Ao: 136/80  LV: 182/15/28  RA: 6  RV: 50/10/16  PA: 42/28 (mean 35)  PCWP: 23  Fick Cardiac Output: 4.99 L/min  Fick Cardiac Index: 2.1 L/min/m2  Central Aortic Saturation: 97%  Pulmonary Artery Saturation: 59%  Aortic Valve Data: Peak to peak gradient 46 mmHg  Mean gradient 12 mmHg  Angiographic Findings:  Left main: No obstructive disease.  Left Anterior Descending Artery: Large caliber vessel that courses to the apex. There is a moderate caliber diagonal branch. No obstructive disease noted.  Circumflex Artery: Large caliber vessel with two small obtuse marginal branches and a moderate caliber posterolateral branch. No obstructive disease noted.  Right Coronary Artery: Large dominant vessel with 70-80% ostial stenosis. 40% proximal stenosis. Distal stented segment is patent with no restenosis.  Left Ventricular Angiogram: Deferred.    Echo 08/10/14: Left ventricle: The cavity size was normal. Wall thickness was normal. Systolic function was normal. The estimated ejection fraction was in the range of 60% to 65%. Doppler parameters are consistent with abnormal left ventricular relaxation (grade 1 diastolic dysfunction). - Aortic valve: AV is difficult to see well From right upper parasternal window peak and mean gradients through the valve are 77 and 50 mm Hg respectively consistent with severe/critical AS. This is increased from echo of 2014.  Assessment and Plan:   1. Severe Aortic valve stenosis: Now s/p TAVR 12/12/14. Doing well. I have personally reviewed her echo images. Echo with normally functioning aortic valve, no AI.  Will continue ASA and Plavix. Will need antibiotic prophylaxis before dental visits.   2. CAD: Cardiac cath September 2015 with moderate ostial RCA stenosis and no disease in the LAD or Circumflex. I have reviewed the cath films again today and this does not appear to be flow limiting. She is having no chest pain. Will continue medical management for now. Will continue ASA, Plavix, statin, beta blocker and ARB.   3. Dyspnea: Much improved. I would expect her to have some dyspnea with which is likely multifactorial due to recent pneumonia, morbid obesity, deconditioning, CAD, asthma and chronic thromboembolic disease.   4. H/O Pulmonary embolism: No residual PE on CTA 02/21/13. She has been off of coumadin.   5. Hyperlipidemia: Followed in lipid clinic. LDL 116 July 2015.

## 2015-01-26 NOTE — Progress Notes (Signed)
Echocardiogram performed.  

## 2015-02-13 ENCOUNTER — Telehealth: Payer: Self-pay | Admitting: Cardiovascular Disease

## 2015-02-15 ENCOUNTER — Ambulatory Visit: Payer: Medicaid Other | Admitting: Cardiovascular Disease

## 2015-02-15 ENCOUNTER — Other Ambulatory Visit (HOSPITAL_COMMUNITY): Payer: Medicaid Other

## 2015-03-29 NOTE — Telephone Encounter (Signed)
error 

## 2015-04-03 ENCOUNTER — Telehealth: Payer: Self-pay | Admitting: Cardiovascular Disease

## 2015-04-03 MED ORDER — METOPROLOL TARTRATE 25 MG PO TABS: 25.0000 mg | ORAL_TABLET | Freq: Two times a day (BID) | ORAL | Status: DC

## 2015-04-03 NOTE — Telephone Encounter (Signed)
New problem   Pt want to know if she still need to continue taking a heart medication. Pt didn't know the name of it but stated she hasn't taken it in 92month. Please advise pt.

## 2015-04-03 NOTE — Telephone Encounter (Signed)
Spoke with patient who states she is having difficulty getting her Metoprolol Rx filled.  I reviewed patient's chart and at last office visit Dr. Angelena Form advised patient continue beta blocker.  Patient has appointment for follow-up with Dr. Angelena Form on 5/26.  Patient states she prefers 90 day supply.  I have sent patient's Rx to requested pharmacy and reminded patient of appointment date/time.  Patient verbalized understanding and agreement.

## 2015-05-08 ENCOUNTER — Telehealth: Payer: Self-pay | Admitting: Cardiovascular Disease

## 2015-05-08 NOTE — Telephone Encounter (Signed)
Reviewed with Tommy Medal, PharmD and pt should not take this medication due to her cardiac history. I spoke with pt and gave her this information and told her Dr. Angelena Form would not prescribe it for her.

## 2015-05-08 NOTE — Telephone Encounter (Signed)
New Message      Pt calling wanting to find out if she could take Belviq for weight loss, and if he could prescribe it to her. Please call back and advise.

## 2015-05-22 ENCOUNTER — Telehealth: Payer: Self-pay | Admitting: Cardiovascular Disease

## 2015-05-22 NOTE — Telephone Encounter (Signed)
Spoke with pt. She had appt scheduled with Dr. Angelena Form for May 24, 2015 but had to cancel. Now scheduled with Truitt Merle, NP on July 03, 2015.  She is asking if OK to see NP.  I told her this would be OK.  She will keep appt for July 03, 2015

## 2015-05-22 NOTE — Telephone Encounter (Signed)
New Message  Pt called. Requests a call back to talk!. She had to cancel may appt due to conflict with PCP scheduling. Offered next available appt with [facility] in September. Pt declined, agreed to see the office PA/NP. Pt states that she believes Dr. Angelena Form would like to see her personally. Pt is requesting a work in with Dr. Angelena Form. Please assist if possible

## 2015-05-24 ENCOUNTER — Ambulatory Visit: Payer: Medicaid Other | Admitting: Cardiovascular Disease

## 2015-06-14 ENCOUNTER — Emergency Department (HOSPITAL_COMMUNITY): Payer: Medicaid Other

## 2015-06-14 ENCOUNTER — Inpatient Hospital Stay (HOSPITAL_COMMUNITY)
Admission: EM | Admit: 2015-06-14 | Discharge: 2015-06-16 | DRG: 190 | Disposition: A | Discharge status: Home / Self Care | Payer: Medicaid Other | Attending: Internal Medicine | Admitting: Internal Medicine

## 2015-06-14 ENCOUNTER — Encounter (HOSPITAL_COMMUNITY): Payer: Self-pay | Admitting: Emergency Medicine

## 2015-06-14 DIAGNOSIS — I252 Old myocardial infarction: Secondary | ICD-10-CM | POA: Diagnosis not present

## 2015-06-14 DIAGNOSIS — G2581 Restless legs syndrome: Secondary | ICD-10-CM | POA: Diagnosis present

## 2015-06-14 DIAGNOSIS — I2510 Atherosclerotic heart disease of native coronary artery without angina pectoris: Secondary | ICD-10-CM | POA: Diagnosis present

## 2015-06-14 DIAGNOSIS — Z7902 Long term (current) use of antithrombotics/antiplatelets: Secondary | ICD-10-CM | POA: Diagnosis not present

## 2015-06-14 DIAGNOSIS — Z86711 Personal history of pulmonary embolism: Secondary | ICD-10-CM

## 2015-06-14 DIAGNOSIS — Z7982 Long term (current) use of aspirin: Secondary | ICD-10-CM | POA: Diagnosis not present

## 2015-06-14 DIAGNOSIS — K219 Gastro-esophageal reflux disease without esophagitis: Secondary | ICD-10-CM | POA: Insufficient documentation

## 2015-06-14 DIAGNOSIS — J440 Chronic obstructive pulmonary disease with acute lower respiratory infection: Secondary | ICD-10-CM | POA: Diagnosis present

## 2015-06-14 DIAGNOSIS — I10 Essential (primary) hypertension: Secondary | ICD-10-CM | POA: Diagnosis present

## 2015-06-14 DIAGNOSIS — J470 Bronchiectasis with acute lower respiratory infection: Secondary | ICD-10-CM | POA: Diagnosis present

## 2015-06-14 DIAGNOSIS — J209 Acute bronchitis, unspecified: Secondary | ICD-10-CM | POA: Diagnosis present

## 2015-06-14 DIAGNOSIS — J45909 Unspecified asthma, uncomplicated: Secondary | ICD-10-CM | POA: Diagnosis present

## 2015-06-14 DIAGNOSIS — E118 Type 2 diabetes mellitus with unspecified complications: Secondary | ICD-10-CM | POA: Diagnosis not present

## 2015-06-14 DIAGNOSIS — Z6841 Body Mass Index (BMI) 40.0 and over, adult: Secondary | ICD-10-CM | POA: Diagnosis not present

## 2015-06-14 DIAGNOSIS — Z96653 Presence of artificial knee joint, bilateral: Secondary | ICD-10-CM | POA: Diagnosis present

## 2015-06-14 DIAGNOSIS — G4733 Obstructive sleep apnea (adult) (pediatric): Secondary | ICD-10-CM | POA: Diagnosis present

## 2015-06-14 DIAGNOSIS — J9620 Acute and chronic respiratory failure, unspecified whether with hypoxia or hypercapnia: Secondary | ICD-10-CM | POA: Diagnosis present

## 2015-06-14 DIAGNOSIS — E662 Morbid (severe) obesity with alveolar hypoventilation: Secondary | ICD-10-CM | POA: Diagnosis present

## 2015-06-14 DIAGNOSIS — M1990 Unspecified osteoarthritis, unspecified site: Secondary | ICD-10-CM | POA: Diagnosis present

## 2015-06-14 DIAGNOSIS — F329 Major depressive disorder, single episode, unspecified: Secondary | ICD-10-CM | POA: Diagnosis present

## 2015-06-14 DIAGNOSIS — Z79899 Other long term (current) drug therapy: Secondary | ICD-10-CM | POA: Diagnosis not present

## 2015-06-14 DIAGNOSIS — E785 Hyperlipidemia, unspecified: Secondary | ICD-10-CM | POA: Diagnosis present

## 2015-06-14 DIAGNOSIS — R0602 Shortness of breath: Secondary | ICD-10-CM | POA: Diagnosis present

## 2015-06-14 DIAGNOSIS — Z9981 Dependence on supplemental oxygen: Secondary | ICD-10-CM | POA: Diagnosis not present

## 2015-06-14 DIAGNOSIS — Z87891 Personal history of nicotine dependence: Secondary | ICD-10-CM | POA: Diagnosis not present

## 2015-06-14 DIAGNOSIS — E119 Type 2 diabetes mellitus without complications: Secondary | ICD-10-CM

## 2015-06-14 DIAGNOSIS — I152 Hypertension secondary to endocrine disorders: Secondary | ICD-10-CM | POA: Diagnosis present

## 2015-06-14 DIAGNOSIS — J449 Chronic obstructive pulmonary disease, unspecified: Secondary | ICD-10-CM | POA: Diagnosis present

## 2015-06-14 DIAGNOSIS — Z955 Presence of coronary angioplasty implant and graft: Secondary | ICD-10-CM

## 2015-06-14 DIAGNOSIS — J9621 Acute and chronic respiratory failure with hypoxia: Secondary | ICD-10-CM | POA: Insufficient documentation

## 2015-06-14 DIAGNOSIS — I5032 Chronic diastolic (congestive) heart failure: Secondary | ICD-10-CM | POA: Diagnosis present

## 2015-06-14 DIAGNOSIS — J441 Chronic obstructive pulmonary disease with (acute) exacerbation: Principal | ICD-10-CM | POA: Diagnosis present

## 2015-06-14 DIAGNOSIS — Z952 Presence of prosthetic heart valve: Secondary | ICD-10-CM | POA: Diagnosis not present

## 2015-06-14 DIAGNOSIS — E1159 Type 2 diabetes mellitus with other circulatory complications: Secondary | ICD-10-CM | POA: Diagnosis present

## 2015-06-14 LAB — CBG MONITORING, ED: Glucose-Capillary: 145 mg/dL — ABNORMAL HIGH (ref 65–99)

## 2015-06-14 LAB — BASIC METABOLIC PANEL
Anion gap: 9 (ref 5–15)
BUN: 12 mg/dL (ref 6–20)
CALCIUM: 8.9 mg/dL (ref 8.9–10.3)
CO2: 28 mmol/L (ref 22–32)
Chloride: 100 mmol/L — ABNORMAL LOW (ref 101–111)
Creatinine, Ser: 0.91 mg/dL (ref 0.44–1.00)
GFR calc Af Amer: >60 mL/min (ref >60)
Glucose, Bld: 145 mg/dL — ABNORMAL HIGH (ref 65–99)
POTASSIUM: 4 mmol/L (ref 3.5–5.1)
Sodium: 137 mmol/L (ref 135–145)

## 2015-06-14 LAB — CBC WITH DIFFERENTIAL/PLATELET
Basophils Absolute: 0 10*3/uL (ref 0.0–0.1)
Basophils Relative: 1 % (ref 0–1)
Eosinophils Absolute: 0.3 10*3/uL (ref 0.0–0.7)
Eosinophils Relative: 3 % (ref 0–5)
HCT: 36.4 % (ref 36.0–46.0)
Hemoglobin: 11.5 g/dL — ABNORMAL LOW (ref 12.0–15.0)
Lymphocytes Relative: 26 % (ref 12–46)
Lymphs Abs: 2.3 10*3/uL (ref 0.7–4.0)
MCH: 26.4 pg (ref 26.0–34.0)
MCHC: 31.6 g/dL (ref 30.0–36.0)
MCV: 83.5 fL (ref 78.0–100.0)
Monocytes Absolute: 0.6 10*3/uL (ref 0.1–1.0)
Monocytes Relative: 7 % (ref 3–12)
Neutro Abs: 5.6 10*3/uL (ref 1.7–7.7)
Neutrophils Relative %: 63 % (ref 43–77)
Platelets: 219 10*3/uL (ref 150–400)
RBC: 4.36 MIL/uL (ref 3.87–5.11)
RDW: 14.6 % (ref 11.5–15.5)
WBC: 8.9 10*3/uL (ref 4.0–10.5)

## 2015-06-14 LAB — I-STAT TROPONIN, ED: Troponin i, poc: 0 ng/mL (ref 0.00–0.08)

## 2015-06-14 IMAGING — CR DG CHEST 2V
2 series · 2 of 2 positions shown · non-contrast
Comparison: [DATE]

CLINICAL DATA: Congestion, productive cough for 3 days

EXAM:
CHEST  2 VIEW

[chest pa]
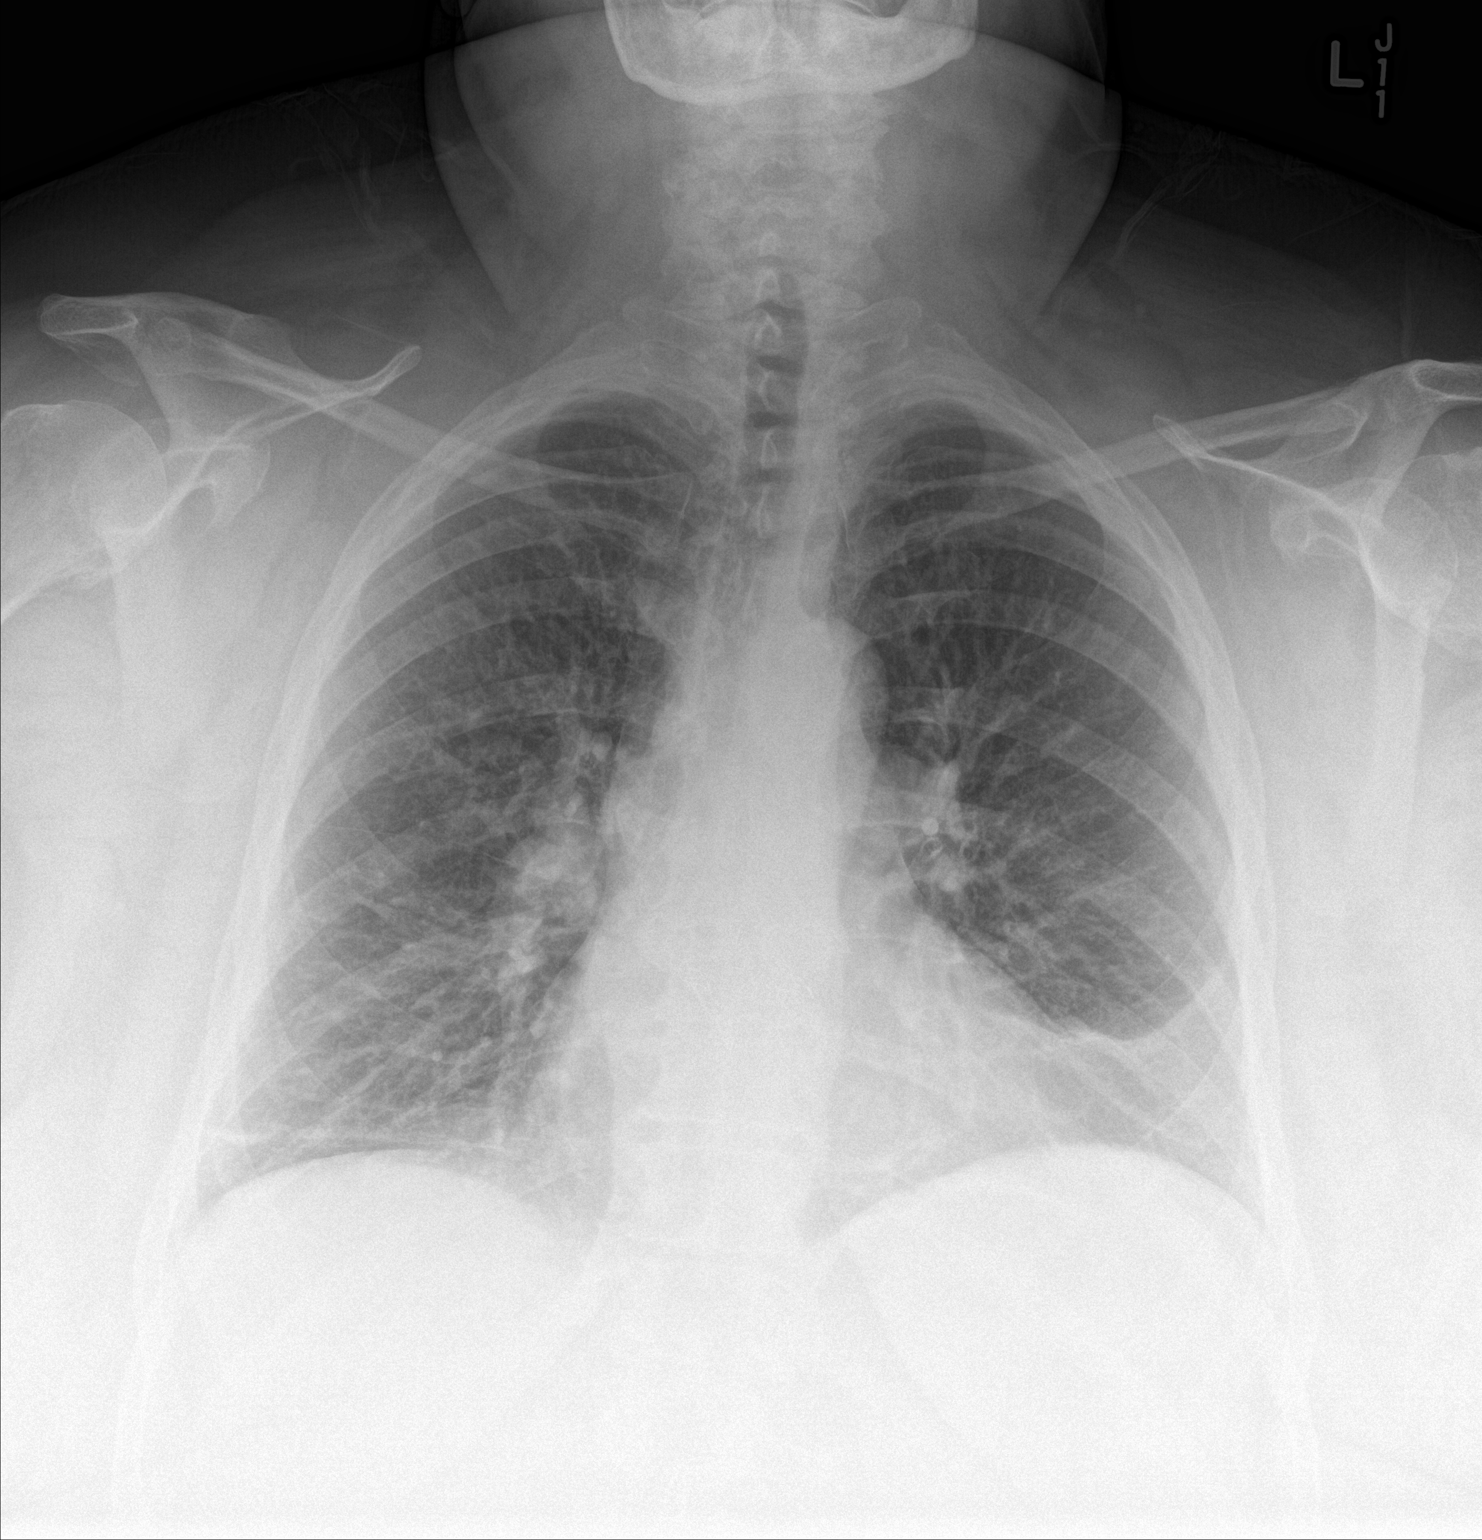

[chest lat]
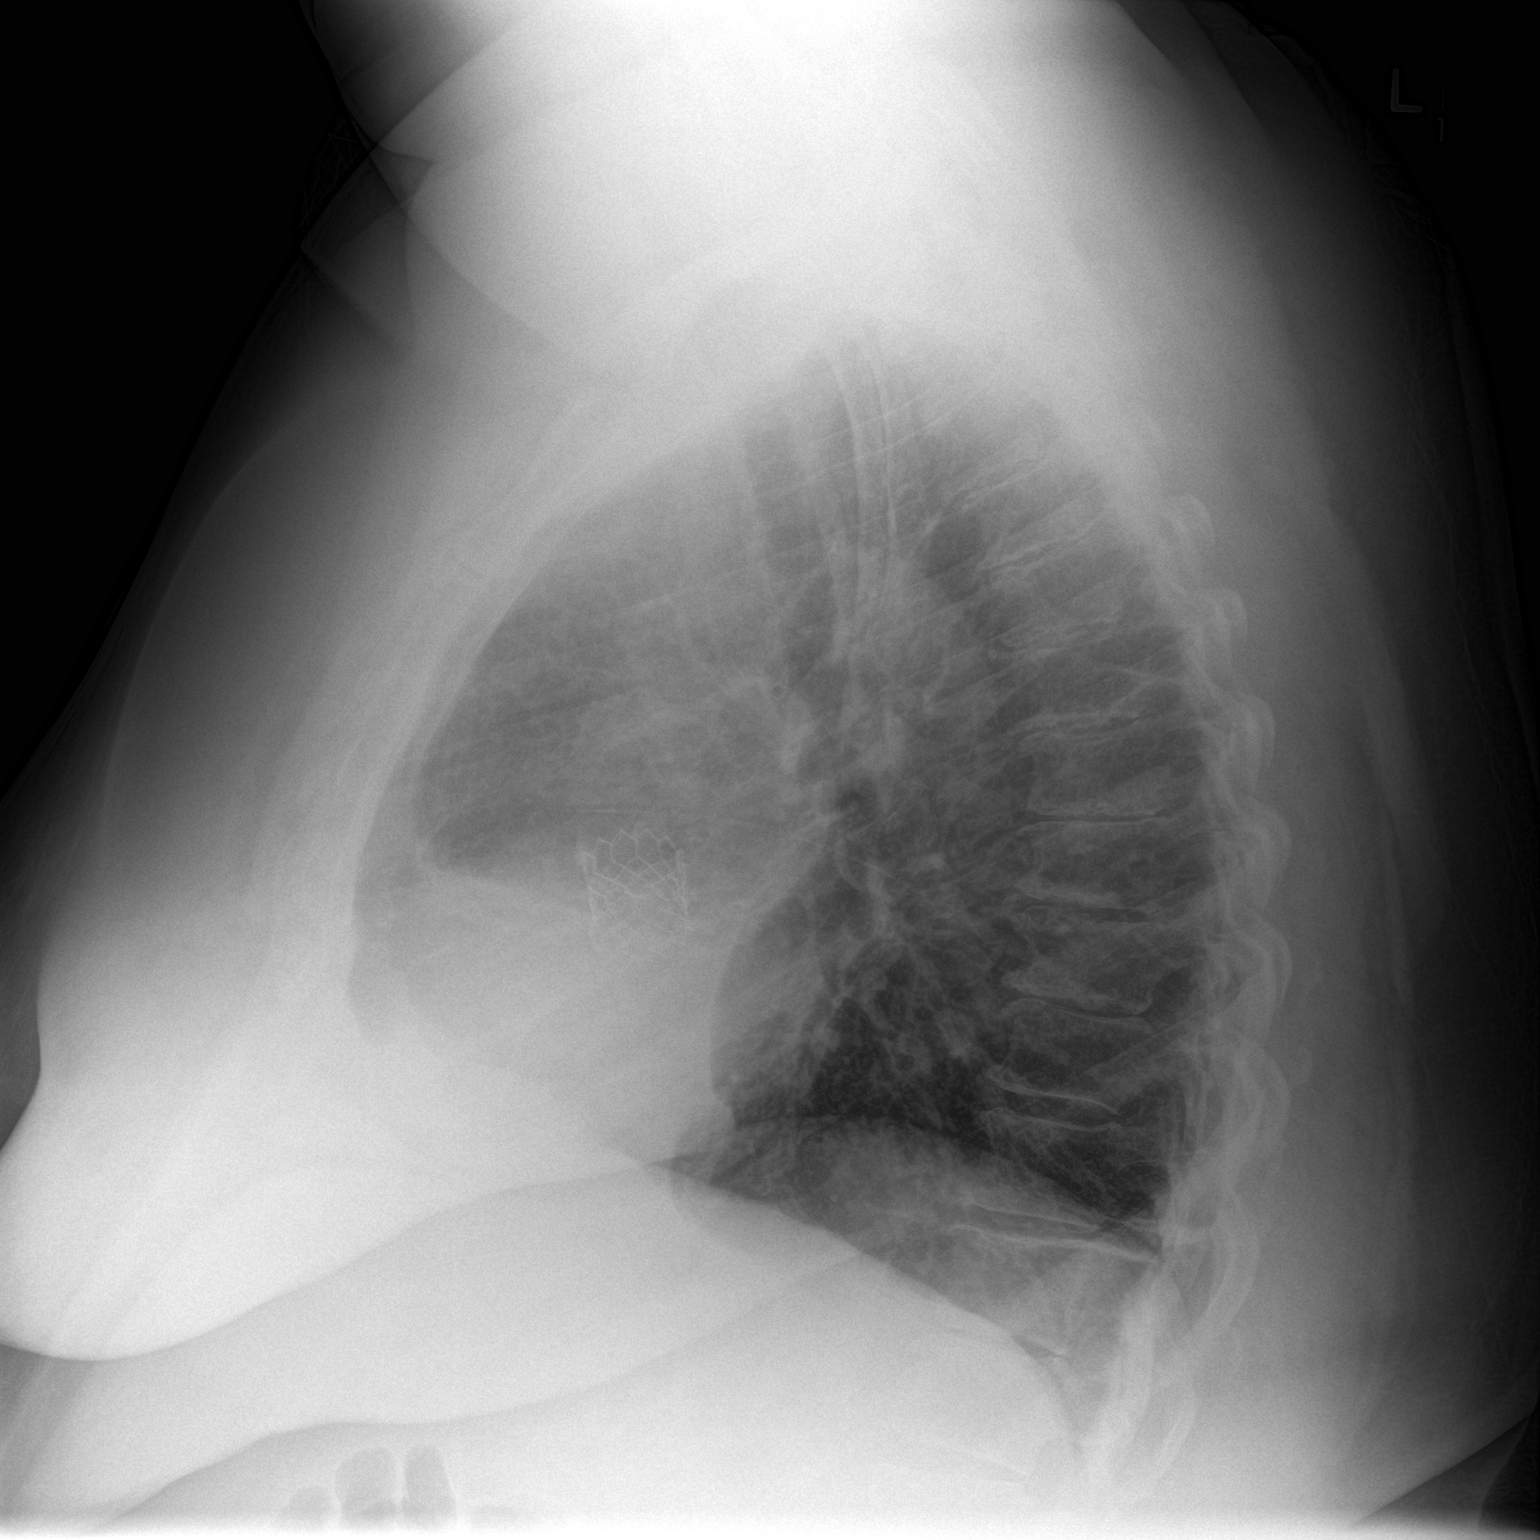

[2 of 2 positions shown; findings below may reference images not displayed]

FINDINGS: Bilateral diffuse interstitial thickening and peribronchial cuffing
most concerning for bronchitis. There is no focal parenchymal
opacity. There is no pleural effusion or pneumothorax. The heart and
mediastinal contours are unremarkable. There is evidence of prior
aortic valve replacement.

There is moderate osteoarthritis of the right glenohumeral joint.
IMPRESSION: 1. Bilateral diffuse interstitial thickening and peribronchial
cuffing most concerning for bronchitis.

## 2015-06-14 MED ORDER — ASPIRIN 325 MG PO TABS
325.0000 mg | ORAL_TABLET | Freq: Every day | ORAL | Status: DC
  Administered 2015-06-15 – 2015-06-16 (×2): 325 mg via ORAL
  Filled 2015-06-14 (×2): qty 1

## 2015-06-14 MED ORDER — CITALOPRAM HYDROBROMIDE 20 MG PO TABS
20.0000 mg | ORAL_TABLET | Freq: Every day | ORAL | Status: DC
  Administered 2015-06-15 – 2015-06-16 (×2): 20 mg via ORAL
  Filled 2015-06-14 (×2): qty 1

## 2015-06-14 MED ORDER — METOPROLOL TARTRATE 25 MG PO TABS
25.0000 mg | ORAL_TABLET | Freq: Two times a day (BID) | ORAL | Status: DC
  Administered 2015-06-15 – 2015-06-16 (×3): 25 mg via ORAL
  Filled 2015-06-14 (×5): qty 1

## 2015-06-14 MED ORDER — AZITHROMYCIN 500 MG PO TABS
500.0000 mg | ORAL_TABLET | Freq: Every day | ORAL | Status: DC
  Filled 2015-06-14: qty 1

## 2015-06-14 MED ORDER — ACETAMINOPHEN 325 MG PO TABS: 325.0000 mg | ORAL_TABLET | Freq: Once | ORAL | Status: DC

## 2015-06-14 MED ORDER — PREDNISONE 50 MG PO TABS
50.0000 mg | ORAL_TABLET | Freq: Every day | ORAL | Status: DC
  Filled 2015-06-14 (×2): qty 1

## 2015-06-14 MED ORDER — AZITHROMYCIN 250 MG PO TABS: 250.0000 mg | ORAL_TABLET | Freq: Every day | ORAL | Status: DC

## 2015-06-14 MED ORDER — EZETIMIBE 10 MG PO TABS
10.0000 mg | ORAL_TABLET | Freq: Every day | ORAL | Status: DC
  Administered 2015-06-15 – 2015-06-16 (×2): 10 mg via ORAL
  Filled 2015-06-14 (×2): qty 1

## 2015-06-14 MED ORDER — FUROSEMIDE 40 MG PO TABS
40.0000 mg | ORAL_TABLET | Freq: Two times a day (BID) | ORAL | Status: DC
  Administered 2015-06-15: 40 mg via ORAL
  Filled 2015-06-14: qty 2

## 2015-06-14 MED ORDER — LOSARTAN POTASSIUM 50 MG PO TABS
50.0000 mg | ORAL_TABLET | Freq: Every day | ORAL | Status: DC
  Administered 2015-06-15 – 2015-06-16 (×2): 50 mg via ORAL
  Filled 2015-06-14 (×3): qty 1

## 2015-06-14 MED ORDER — ALBUTEROL SULFATE (2.5 MG/3ML) 0.083% IN NEBU
2.5000 mg | INHALATION_SOLUTION | RESPIRATORY_TRACT | Status: DC | PRN
  Administered 2015-06-16: 2.5 mg via RESPIRATORY_TRACT
  Filled 2015-06-14: qty 3

## 2015-06-14 MED ORDER — HEPARIN SODIUM (PORCINE) 5000 UNIT/ML IJ SOLN
5000.0000 [IU] | Freq: Three times a day (TID) | INTRAMUSCULAR | Status: DC
  Administered 2015-06-15 – 2015-06-16 (×5): 5000 [IU] via SUBCUTANEOUS
  Filled 2015-06-14 (×8): qty 1

## 2015-06-14 MED ORDER — GABAPENTIN 300 MG PO CAPS
300.0000 mg | ORAL_CAPSULE | Freq: Two times a day (BID) | ORAL | Status: DC
  Administered 2015-06-15 – 2015-06-16 (×4): 300 mg via ORAL
  Filled 2015-06-14 (×5): qty 1

## 2015-06-14 MED ORDER — POTASSIUM CHLORIDE CRYS ER 20 MEQ PO TBCR
40.0000 meq | EXTENDED_RELEASE_TABLET | Freq: Every day | ORAL | Status: DC
  Administered 2015-06-15 – 2015-06-16 (×2): 40 meq via ORAL
  Filled 2015-06-14 (×2): qty 2

## 2015-06-14 MED ORDER — DEXAMETHASONE SODIUM PHOSPHATE 10 MG/ML IJ SOLN
10.0000 mg | Freq: Once | INTRAMUSCULAR | Status: AC
  Administered 2015-06-14: 10 mg via INTRAVENOUS
  Filled 2015-06-14: qty 1

## 2015-06-14 MED ORDER — ACETAMINOPHEN 500 MG PO TABS
1000.0000 mg | ORAL_TABLET | Freq: Once | ORAL | Status: AC
  Administered 2015-06-15: 1000 mg via ORAL
  Filled 2015-06-14: qty 2

## 2015-06-14 MED ORDER — CLOPIDOGREL BISULFATE 75 MG PO TABS
75.0000 mg | ORAL_TABLET | Freq: Every morning | ORAL | Status: DC
  Administered 2015-06-15 – 2015-06-16 (×2): 75 mg via ORAL
  Filled 2015-06-14 (×2): qty 1

## 2015-06-14 MED ORDER — ALBUTEROL (5 MG/ML) CONTINUOUS INHALATION SOLN
10.0000 mg/h | INHALATION_SOLUTION | RESPIRATORY_TRACT | Status: AC
  Administered 2015-06-14: 10 mg/h via RESPIRATORY_TRACT
  Filled 2015-06-14: qty 20

## 2015-06-14 MED ORDER — ALBUTEROL SULFATE HFA 108 (90 BASE) MCG/ACT IN AERS: 2.0000 | INHALATION_SPRAY | RESPIRATORY_TRACT | Status: DC | PRN

## 2015-06-14 MED ORDER — GLIPIZIDE 5 MG PO TABS
5.0000 mg | ORAL_TABLET | Freq: Every day | ORAL | Status: DC
  Administered 2015-06-15: 5 mg via ORAL
  Filled 2015-06-14 (×2): qty 1

## 2015-06-14 MED ORDER — ATORVASTATIN CALCIUM 80 MG PO TABS
80.0000 mg | ORAL_TABLET | Freq: Every day | ORAL | Status: DC
  Administered 2015-06-15: 80 mg via ORAL
  Filled 2015-06-14 (×2): qty 1

## 2015-06-14 MED ORDER — BUDESONIDE-FORMOTEROL FUMARATE 160-4.5 MCG/ACT IN AERO
2.0000 | INHALATION_SPRAY | Freq: Two times a day (BID) | RESPIRATORY_TRACT | Status: DC
  Administered 2015-06-15: 2 via RESPIRATORY_TRACT
  Filled 2015-06-14: qty 6

## 2015-06-14 NOTE — ED Provider Notes (Signed)
CSN: [id number]     Arrival date & time 06/14/15  1817 History   First MD Initiated Contact with Patient 06/14/15 1956     Chief Complaint  Patient presents with  . Cough    The patient said she went Dr. Fransico Setters office and she sent the patient here.  The patient said she has a productiive cough with green sputum.   . Nasal Congestion     (Consider location/radiation/quality/duration/timing/severity/associated sxs/prior Treatment) HPI Patient presents to the emergency department with wheezing and shortness of breath that has gotten worse over the last few days.  The patient states she was seen at her doctor's office today and given nebulized treatments and sent here to the emergency department for further evaluation.  The patient states that she has had to be admitted for her COPD in the past.  Patient's on 3 L of action as needed at home.  The patient denies chest pain, nausea, vomiting, weakness, dizziness, headache, blurred vision, fever, back pain, dysuria, incontinence, lightheadedness or syncope.  The patient states that nothing seems to really make her condition worse.  She states that she has noticed more wheezing, and that is what took her to her doctor today Past Medical History  Diagnosis Date  . Asthma   . Hypertension   . Hyperlipidemia   . GERD (gastroesophageal reflux disease)   . Depression   . Blood transfusion     2013[facility]  . Headache(784.0)     h/o migraines  . Pulmonary embolism 02/08/12    S/P RT TOTAL KNEE ON 02/03/12--ON 02/08/12--DEVELOPED ACUTE SOB AND CHEST PAIN--AND DIAGNOSED WITH  PULMONARY EMBOLUS AND PNEUMONIA  . Restless leg syndrome   . Myocardial infarction     PT THINKS SHE WAS DX WITH MI AT THE TIME OF HEART STENTING  . CAD (coronary artery disease)     Cath 2010 with DES x 1 RCA-- PT'S CARDIOLOGIST IS DR. [place]  . Uterine fibroid     NO PROBLEMS AT PRESENT FROM THE FIBROIDS-STATES SHE IS POST MENOPAUSAL-LAST MENSES 2010 EXCEPT FOR EPISODE THIS YR OF  BLEEDING RELATED TO FIBROIDS.  Marland Kitchen Heart murmur   . Pneumonia 02/08/12    HOSPITALIZED AT [facility] WITH PNEUMONIA AND WITH PULMONARY EMBOLUS  . Diabetes mellitus DIAGNOSED IN2010    ORAL MEDS  . Arthritis     PAIN AND SEVERE OA LEFT KNEE ; S/P RIGHT TKA ON 02/03/12; HAS LOWER BACK PAIN-UNABLE TO STAND MORE THAN 10 MIN; ARTHRITIS "ALL OVER"  . Neck pain   . Weakness     BOTH HANDS - S/P BILATERAL CARPAL TUNNEL RELEASE--BUT STILL HAS WEAKNESS--OFTEN DROPS THINGS  . Shortness of breath     with exertion  . Eczema     on back  . Kidney stones   . Anemia   . COPD (chronic obstructive pulmonary disease)   . Sleep apnea     uses 3 liters O2 at night   . Aortic valve stenosis, severe   . Aortic stenosis   . Morbid obesity with body mass index of 60.0-69.9 in adult   . Chronic diastolic congestive heart failure    Past Surgical History  Procedure Laterality Date  . Tubal ligation    . Carpal tunnel release      Bilateral  . Cholecystectomy    . Hernia repair    . Cardiac catheterization    . Knee arthroplasty  02/03/2012    Procedure: COMPUTER ASSISTED TOTAL KNEE ARTHROPLASTY;  Surgeon: Lind Guest  Ninfa Linden, MD;  Location: [place];  Service: Orthopedics;  Laterality: Right;  Right total knee arthroplasty  . Coronary angioplasty      2010 has stent in place  . Total knee arthroplasty  09/10/2012    Procedure: TOTAL KNEE ARTHROPLASTY;  Surgeon: Mcarthur Rossetti, MD;  [facility];  Service: Orthopedics;  Laterality: Left;  . Trigger finger release  09/10/2012    Procedure: RELEASE TRIGGER FINGER/A-1 PULLEY;  Surgeon: Mcarthur Rossetti, MD;  [facility];  Service: Orthopedics;  Laterality: Right;  Right Ring Finger  . Tee without cardioversion N/A 03/14/2013    Procedure: TRANSESOPHAGEAL ECHOCARDIOGRAM (TEE);  Surgeon: Lelon Perla, MD;  Location: [facility] ENDOSCOPY;  Service: Cardiovascular;  Laterality: N/A;  . Joint replacement      bil total knees  . Total knee revision Right  07/15/2013    Procedure: REVISION ARTHROPLASTY RIGHT KNEE;  Surgeon: Mcarthur Rossetti, MD;  [facility];  Service: Orthopedics;  Laterality: Right;  . Cystoscopy w/ retrogrades Right 09/21/2013    Procedure: CYSTOSCOPY WITH RIGHT RETROGRADE PYELOGRAM RIGHT DOUBLE J STENT ;  Surgeon: Fredricka Bonine, MD;  [facility];  Service: Urology;  Laterality: Right;  . Cystoscopy with ureteroscopy and stent placement Right 10/25/2013    Procedure: CYSTOSCOPY RIGHT URETEROSCOPY HOLMIUM LASER LITHO AND STENT PLACEMENT;  Surgeon: Fredricka Bonine, MD;  [facility];  Service: Urology;  Laterality: Right;  . Tee without cardioversion N/A 11/14/2014    Procedure: TRANSESOPHAGEAL ECHOCARDIOGRAM (TEE);  Surgeon: Lelon Perla, MD;  Location: [facility] ENDOSCOPY;  Service: Cardiovascular;  Laterality: N/A;  . Left and right heart catheterization with coronary angiogram N/A 03/17/2013    Procedure: LEFT AND RIGHT HEART CATHETERIZATION WITH CORONARY ANGIOGRAM;  Surgeon: Burnell Blanks, MD;  Location: [facility] CATH LAB;  Service: Cardiovascular;  Laterality: N/A;  . Left and right heart catheterization with coronary/graft angiogram N/A 09/14/2014    Procedure: LEFT AND RIGHT HEART CATHETERIZATION WITH Beatrix Fetters;  Surgeon: Burnell Blanks, MD;  Location: [facility] CATH LAB;  Service: Cardiovascular;  Laterality: N/A;  . Transcatheter aortic valve replacement, transfemoral N/A 12/12/2014    Procedure: TRANSCATHETER AORTIC VALVE REPLACEMENT, TRANSFEMORAL;  Surgeon: Burnell Blanks, MD;  Location: [place];  Service: Cardiovascular;  Laterality: N/A;  . Intraoperative transesophageal echocardiogram N/A 12/12/2014    Procedure: INTRAOPERATIVE TRANSESOPHAGEAL ECHOCARDIOGRAM;  Surgeon: Burnell Blanks, MD;  Location: [facility] OR;  Service: Cardiovascular;  Laterality: N/A;   Family History  Problem Relation Age of Onset  . Breast cancer Mother   . COPD Father     smoked  .  Cancer Brother     Sinus  . Emphysema Mother     smoked  . Asthma Father   . Heart disease Mother   . Heart disease Father    History  Substance Use Topics  . Smoking status: Former Smoker -- 1.50 packs/day for 30 years    Types: Cigarettes    Quit date: 12/29/2000  . Smokeless tobacco: Never Used  . Alcohol Use: Yes     Comment: rarely   OB History    No data available     Review of Systems  All other systems negative except as documented in the HPI. All pertinent positives and negatives as reviewed in the HPI.  Allergies  Review of patient's allergies indicates no known allergies.  Home Medications   Prior to Admission medications   Medication Sig Start Date End Date Taking? Authorizing Provider  albuterol (PROVENTIL HFA;VENTOLIN HFA) 108 (90 BASE) MCG/ACT inhaler Inhale 2 puffs into the lungs every 4 (four) hours as needed for wheezing or shortness of breath.    Yes Historical Provider, MD  albuterol (PROVENTIL) (2.5 MG/3ML) 0.083% nebulizer solution Take 2.5 mg by nebulization every 4 (four) hours as needed for wheezing or shortness of breath.    Yes Historical Provider, MD  aspirin 325 MG tablet Take 325 mg by mouth daily.   Yes Historical Provider, MD  atorvastatin (LIPITOR) 80 MG tablet Take 80 mg by mouth daily.   Yes Historical Provider, MD  budesonide-formoterol (SYMBICORT) 160-4.5 MCG/ACT inhaler Inhale 2 puffs into the lungs 2 (two) times daily.   Yes Historical Provider, MD  citalopram (CELEXA) 20 MG tablet Take 20 mg by mouth daily.   Yes Historical Provider, MD  clopidogrel (PLAVIX) 75 MG tablet Take 75 mg by mouth every morning.    Yes Historical Provider, MD  ezetimibe (ZETIA) 10 MG tablet Take 10 mg by mouth daily.   Yes Historical Provider, MD  furosemide (LASIX) 40 MG tablet Take 40 mg by mouth 2 (two) times daily.   Yes Historical Provider, MD  gabapentin (NEURONTIN) 300 MG capsule Take 300 mg by mouth 2 (two) times daily.    Yes Historical Provider, MD   glipiZIDE (GLUCOTROL) 5 MG tablet Take 5 mg by mouth daily before supper.    Yes Historical Provider, MD  losartan (COZAAR) 50 MG tablet Take 1 tablet (50 mg total) by mouth daily with breakfast. 12/16/14  Yes Donielle Liston Alba, PA-C  MAGNESIUM PO Take 1-2 tablets by mouth daily as needed (constipation).    Yes Historical Provider, MD  metoprolol tartrate (LOPRESSOR) 25 MG tablet Take 1 tablet (25 mg total) by mouth 2 (two) times daily. 04/03/15  Yes Burnell Blanks, MD  potassium chloride SA (K-DUR,KLOR-CON) 20 MEQ tablet Take 40 mEq by mouth daily.   Yes Historical Provider, MD  aspirin EC 81 MG EC tablet Take 1 tablet (81 mg total) by mouth daily. Patient not taking: Reported on 06/14/2015 12/16/14   Nani Skillern, PA-C  OXYGEN-HELIUM IN Inhale 3 L into the lungs daily.     Historical Provider, MD   BP 130/83 mmHg  Pulse 109  Temp(Src) 98 F (36.7 C) (Oral)  Resp 20  SpO2 88%  LMP 07/26/2009 Physical Exam  Constitutional: She is oriented to person, place, and time. She appears well-developed and well-nourished. No distress.  HENT:  Head: Normocephalic and atraumatic.  Mouth/Throat: Oropharynx is clear and moist.  Eyes: Pupils are equal, round, and reactive to light.  Neck: Normal range of motion. Neck supple.  Cardiovascular: Normal rate, regular rhythm and normal heart sounds.  Exam reveals no gallop and no friction rub.   No murmur heard. Pulmonary/Chest: Effort normal. No respiratory distress. She has wheezes. She has no rales.  Neurological: She is alert and oriented to person, place, and time. No cranial nerve deficit. She exhibits normal muscle tone. Coordination normal.  Skin: Skin is warm and dry. No rash noted. No erythema.  Nursing note and vitals reviewed.   ED Course  Procedures (including critical care time) Labs Review Labs Reviewed  BASIC METABOLIC PANEL - Abnormal; Notable for the following:    Chloride 100 (*)    Glucose, Bld 145 (*)    All  other components within normal limits  CBC WITH DIFFERENTIAL/PLATELET - Abnormal; Notable for the following:    Hemoglobin 11.5 (*)    All other  components within normal limits  CBG MONITORING, ED - Abnormal; Notable for the following:    Glucose-Capillary 145 (*)    All other components within normal limits  I-STAT TROPOININ, ED    Imaging Review Dg Chest 2 View (if Patient Has Fever And/or Copd)  06/14/2015   CLINICAL DATA:  Congestion, productive cough for 3 days  EXAM: CHEST  2 VIEW  COMPARISON:  12/30/2014  FINDINGS: Bilateral diffuse interstitial thickening and peribronchial cuffing most concerning for bronchitis. There is no focal parenchymal opacity. There is no pleural effusion or pneumothorax. The heart and mediastinal contours are unremarkable. There is evidence of prior aortic valve replacement.  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  IMPRESSION: 1. Bilateral diffuse interstitial thickening and peribronchial cuffing most concerning for bronchitis.   Electronically Signed   By: Kathreen Devoid   On: 06/14/2015 19:42     EKG Interpretation None      I spoke with the [facility] will admit the patient to the hospital for COPD exacerbation.  Patient is advised with plan and all questions were answered    Dalia Heading, PA-C 06/15/15 0014  Orlie Dakin, MD 06/15/15 0025

## 2015-06-14 NOTE — ED Notes (Signed)
Patient states has Hx of COPD patient states she wears 3L PRN patient o2 93%

## 2015-06-14 NOTE — ED Notes (Signed)
Pt called out stating that she feels like her sugar is dropping.

## 2015-06-14 NOTE — ED Provider Notes (Signed)
Complains of shortness of breath and cough productive of yellowish sputum for one week, becoming worse over the past 3 days. Admits to subjective fever no nausea or vomiting. No other associated symptoms. Patient's breathing is not baseline after treatment with continuous nebulization and intravenous Decadron. Chest x-ray viewed by me. On exam patient speaks in sentences. Lungs with prolonged expiratory phase with expiratory wheezes  Orlie Dakin, MD 06/15/15 0025

## 2015-06-14 NOTE — H&P (Signed)
[facility] History and Physical  [name] FMB:[id number] DOB: June 22, 1961 DOA: 06/14/2015  Referring physician: EDP PCP: [name] Font, MD   Chief Complaint: SOB   HPI: [name] is a 54 y.o. female with h/o COPD, on 3L home O2 as needed at home.  Patient presents to ED with c/o cough, SOB.  Symptoms have been worsening over the past 3 days.  Cough productive of yellowish sputum.  Associated wheezing.  Neb treatments at home not helping much.  No fever, chills, N/V.  Review of Systems: Systems reviewed.  As above, otherwise negative  Past Medical History  Diagnosis Date  . Asthma   . Hypertension   . Hyperlipidemia   . GERD (gastroesophageal reflux disease)   . Depression   . Blood transfusion     2013[facility]  . Headache(784.0)     h/o migraines  . Pulmonary embolism 02/08/12    S/P RT TOTAL KNEE ON 02/03/12--ON 02/08/12--DEVELOPED ACUTE SOB AND CHEST PAIN--AND DIAGNOSED WITH  PULMONARY EMBOLUS AND PNEUMONIA  . Restless leg syndrome   . Myocardial infarction     PT THINKS SHE WAS DX WITH MI AT THE TIME OF HEART STENTING  . CAD (coronary artery disease)     Cath 2010 with DES x 1 RCA-- PT'S CARDIOLOGIST IS DR. [place]  . Uterine fibroid     NO PROBLEMS AT PRESENT FROM THE FIBROIDS-STATES SHE IS POST MENOPAUSAL-LAST MENSES 2010 EXCEPT FOR EPISODE THIS YR OF BLEEDING RELATED TO FIBROIDS.  Marland Kitchen Heart murmur   . Pneumonia 02/08/12    HOSPITALIZED AT [facility] WITH PNEUMONIA AND WITH PULMONARY EMBOLUS  . Diabetes mellitus DIAGNOSED IN2010    ORAL MEDS  . Arthritis     PAIN AND SEVERE OA LEFT KNEE ; S/P RIGHT TKA ON 02/03/12; HAS LOWER BACK PAIN-UNABLE TO STAND MORE THAN 10 MIN; ARTHRITIS "ALL OVER"  . Neck pain   . Weakness     BOTH HANDS - S/P BILATERAL CARPAL TUNNEL RELEASE--BUT STILL HAS WEAKNESS--OFTEN DROPS THINGS  . Shortness of breath     with exertion  . Eczema     on back  . Kidney stones   . Anemia   . COPD (chronic obstructive pulmonary disease)   .  Sleep apnea     uses 3 liters O2 at night   . Aortic valve stenosis, severe   . Aortic stenosis   . Morbid obesity with body mass index of 60.0-69.9 in adult   . Chronic diastolic congestive heart failure    Past Surgical History  Procedure Laterality Date  . Tubal ligation    . Carpal tunnel release      Bilateral  . Cholecystectomy    . Hernia repair    . Cardiac catheterization    . Knee arthroplasty  02/03/2012    Procedure: COMPUTER ASSISTED TOTAL KNEE ARTHROPLASTY;  Surgeon: Mcarthur Rossetti, MD;  Location: [place];  Service: Orthopedics;  Laterality: Right;  Right total knee arthroplasty  . Coronary angioplasty      2010 has stent in place  . Total knee arthroplasty  09/10/2012    Procedure: TOTAL KNEE ARTHROPLASTY;  Surgeon: Mcarthur Rossetti, MD;  [facility];  Service: Orthopedics;  Laterality: Left;  . Trigger finger release  09/10/2012    Procedure: RELEASE TRIGGER FINGER/A-1 PULLEY;  Surgeon: Mcarthur Rossetti, MD;  [facility];  Service: Orthopedics;  Laterality: Right;  Right Ring Finger  . Tee without cardioversion N/A 03/14/2013  Procedure: TRANSESOPHAGEAL ECHOCARDIOGRAM (TEE);  Surgeon: Lelon Perla, MD;  Location: [facility] ENDOSCOPY;  Service: Cardiovascular;  Laterality: N/A;  . Joint replacement      bil total knees  . Total knee revision Right 07/15/2013    Procedure: REVISION ARTHROPLASTY RIGHT KNEE;  Surgeon: Mcarthur Rossetti, MD;  [facility];  Service: Orthopedics;  Laterality: Right;  . Cystoscopy w/ retrogrades Right 09/21/2013    Procedure: CYSTOSCOPY WITH RIGHT RETROGRADE PYELOGRAM RIGHT DOUBLE J STENT ;  Surgeon: Fredricka Bonine, MD;  [facility];  Service: Urology;  Laterality: Right;  . Cystoscopy with ureteroscopy and stent placement Right 10/25/2013    Procedure: CYSTOSCOPY RIGHT URETEROSCOPY HOLMIUM LASER LITHO AND STENT PLACEMENT;  Surgeon: Fredricka Bonine, MD;  [facility];  Service: Urology;   Laterality: Right;  . Tee without cardioversion N/A 11/14/2014    Procedure: TRANSESOPHAGEAL ECHOCARDIOGRAM (TEE);  Surgeon: Lelon Perla, MD;  Location[facility] ENDOSCOPY;  Service: Cardiovascular;  Laterality: N/A;  . Left and right heart catheterization with coronary angiogram N/A 03/17/2013    Procedure: LEFT AND RIGHT HEART CATHETERIZATION WITH CORONARY ANGIOGRAM;  Surgeon: Burnell Blanks, MD;  Location: [facility] CATH LAB;  Service: Cardiovascular;  Laterality: N/A;  . Left and right heart catheterization with coronary/graft angiogram N/A 09/14/2014    Procedure: LEFT AND RIGHT HEART CATHETERIZATION WITH Beatrix Fetters;  Surgeon: Burnell Blanks, MD;  Location: [facility] CATH LAB;  Service: Cardiovascular;  Laterality: N/A;  . Transcatheter aortic valve replacement, transfemoral N/A 12/12/2014    Procedure: TRANSCATHETER AORTIC VALVE REPLACEMENT, TRANSFEMORAL;  Surgeon: Burnell Blanks, MD;  Location: [place];  Service: Cardiovascular;  Laterality: N/A;  . Intraoperative transesophageal echocardiogram N/A 12/12/2014    Procedure: INTRAOPERATIVE TRANSESOPHAGEAL ECHOCARDIOGRAM;  Surgeon: Burnell Blanks, MD;  Location: [facility] OR;  Service: Cardiovascular;  Laterality: N/A;   Social History:  reports that she quit smoking about 14 years ago. Her smoking use included Cigarettes. She has a 45 pack-year smoking history. She has never used smokeless tobacco. She reports that she drinks alcohol. She reports that she does not use illicit drugs.  No Known Allergies  Family History  Problem Relation Age of Onset  . Breast cancer Mother   . COPD Father     smoked  . Cancer Brother     Sinus  . Emphysema Mother     smoked  . Asthma Father   . Heart disease Mother   . Heart disease Father      Prior to Admission medications   Medication Sig Start Date End Date Taking? Authorizing Provider  albuterol (PROVENTIL HFA;VENTOLIN HFA) 108 (90 BASE) MCG/ACT inhaler Inhale 2 puffs into  the lungs every 4 (four) hours as needed for wheezing or shortness of breath.    Yes Historical Provider, MD  albuterol (PROVENTIL) (2.5 MG/3ML) 0.083% nebulizer solution Take 2.5 mg by nebulization every 4 (four) hours as needed for wheezing or shortness of breath.    Yes Historical Provider, MD  aspirin 325 MG tablet Take 325 mg by mouth daily.   Yes Historical Provider, MD  atorvastatin (LIPITOR) 80 MG tablet Take 80 mg by mouth daily.   Yes Historical Provider, MD  budesonide-formoterol (SYMBICORT) 160-4.5 MCG/ACT inhaler Inhale 2 puffs into the lungs 2 (two) times daily.   Yes Historical Provider, MD  citalopram (CELEXA) 20 MG tablet Take 20 mg by mouth daily.   Yes Historical Provider, MD  clopidogrel (PLAVIX) 75 MG tablet Take 75 mg by mouth every  morning.    Yes Historical Provider, MD  ezetimibe (ZETIA) 10 MG tablet Take 10 mg by mouth daily.   Yes Historical Provider, MD  furosemide (LASIX) 40 MG tablet Take 40 mg by mouth 2 (two) times daily.   Yes Historical Provider, MD  gabapentin (NEURONTIN) 300 MG capsule Take 300 mg by mouth 2 (two) times daily.    Yes Historical Provider, MD  glipiZIDE (GLUCOTROL) 5 MG tablet Take 5 mg by mouth daily before supper.    Yes Historical Provider, MD  losartan (COZAAR) 50 MG tablet Take 1 tablet (50 mg total) by mouth daily with breakfast. 12/16/14  Yes Donielle Liston Alba, PA-C  MAGNESIUM PO Take 1-2 tablets by mouth daily as needed (constipation).    Yes Historical Provider, MD  metoprolol tartrate (LOPRESSOR) 25 MG tablet Take 1 tablet (25 mg total) by mouth 2 (two) times daily. 04/03/15  Yes Burnell Blanks, MD  potassium chloride SA (K-DUR,KLOR-CON) 20 MEQ tablet Take 40 mEq by mouth daily.   Yes Historical Provider, MD  aspirin EC 81 MG EC tablet Take 1 tablet (81 mg total) by mouth daily. Patient not taking: Reported on 06/14/2015 12/16/14   Nani Skillern, PA-C  OXYGEN-HELIUM IN Inhale 3 L into the lungs daily.     Historical  Provider, MD   Physical Exam: Filed Vitals:   06/14/15 2300  BP: 145/73  Pulse: 104  Temp:   Resp: 23    BP 145/73 mmHg  Pulse 104  Temp(Src) 98 F (36.7 C) (Oral)  Resp 23  SpO2 96%  LMP 07/26/2009  General Appearance:    Alert, oriented, no distress, appears stated age  Head:    Normocephalic, atraumatic  Eyes:    PERRL, EOMI, sclera non-icteric        Nose:   Nares without drainage or epistaxis. Mucosa, turbinates normal  Throat:   Moist mucous membranes. Oropharynx without erythema or exudate.  Neck:   Supple. No carotid bruits.  No thyromegaly.  No lymphadenopathy.   Back:     No CVA tenderness, no spinal tenderness  Lungs:     Diffuse wheezing  Chest wall:    No tenderness to palpitation  Heart:    Regular rate and rhythm without murmurs, gallops, rubs  Abdomen:     Soft, non-tender, nondistended, normal bowel sounds, no organomegaly  Genitalia:    deferred  Rectal:    deferred  Extremities:   No clubbing, cyanosis or edema.  Pulses:   2+ and symmetric all extremities  Skin:   Skin color, texture, turgor normal, no rashes or lesions  Lymph nodes:   Cervical, supraclavicular, and axillary nodes normal  Neurologic:   CNII-XII intact. Normal strength, sensation and reflexes      throughout    Labs on Admission:  Basic Metabolic Panel:  Recent Labs Lab 06/14/15 1852  NA 137  K 4.0  CL 100*  CO2 28  GLUCOSE 145*  BUN 12  CREATININE 0.91  CALCIUM 8.9   Liver Function Tests: No results for input(s): AST, ALT, ALKPHOS, BILITOT, PROT, ALBUMIN in the last 168 hours. No results for input(s): LIPASE, AMYLASE in the last 168 hours. No results for input(s): AMMONIA in the last 168 hours. CBC:  Recent Labs Lab 06/14/15 1852  WBC 8.9  NEUTROABS 5.6  HGB 11.5*  HCT 36.4  MCV 83.5  PLT 219   Cardiac Enzymes: No results for input(s): CKTOTAL, CKMB, CKMBINDEX, TROPONINI in the last 168 hours.  BNP (  last 3 results) No results for input(s): PROBNP in the  last 8760 hours. CBG:  Recent Labs Lab 06/14/15 2113  GLUCAP 145*    Radiological Exams on Admission: Dg Chest 2 View (if Patient Has Fever And/or Copd)  06/14/2015   CLINICAL DATA:  Congestion, productive cough for 3 days  EXAM: CHEST  2 VIEW  COMPARISON:  12/30/2014  FINDINGS: Bilateral diffuse interstitial thickening and peribronchial cuffing most concerning for bronchitis. There is no focal parenchymal opacity. There is no pleural effusion or pneumothorax. The heart and mediastinal contours are unremarkable. There is evidence of prior aortic valve replacement.  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  IMPRESSION: 1. Bilateral diffuse interstitial thickening and peribronchial cuffing most concerning for bronchitis.   Electronically Signed   By: Kathreen Devoid   On: 06/14/2015 19:42    EKG: Independently reviewed.  Assessment/Plan Principal Problem:   COPD exacerbation Active Problems:   DM (diabetes mellitus)   COPD (chronic obstructive pulmonary disease)   Essential hypertension   Acute bronchitis   1. COPD exacerbation with acute bronchitis - 1. Adult wheeze protocol 2. Prednisone daily 3. Z-pak ordered given cough productive of purulent sputum 4. O2 via [place] 2. DM2 - continue home meds 3. HTN - continue home meds    Code Status: Full Code  Family Communication: No family in room Disposition Plan: Admit to inpatient   Time spent: 70 min  GARDNER, JARED M. [facility] Pager [phone]  If 7AM-7PM, please contact the day team taking care of the patient [email or web address] Password [facility] 06/14/2015, 11:55 PM

## 2015-06-14 NOTE — ED Notes (Signed)
The patient said she went Dr. Fransico Setters office and she sent the patient here.  The patient said she has a productiive cough with green sputum.   She received three breathing treatments at the office and sent her here.

## 2015-06-15 ENCOUNTER — Encounter (HOSPITAL_COMMUNITY): Payer: Self-pay | Admitting: *Deleted

## 2015-06-15 DIAGNOSIS — E118 Type 2 diabetes mellitus with unspecified complications: Secondary | ICD-10-CM

## 2015-06-15 DIAGNOSIS — I10 Essential (primary) hypertension: Secondary | ICD-10-CM

## 2015-06-15 DIAGNOSIS — J441 Chronic obstructive pulmonary disease with (acute) exacerbation: Principal | ICD-10-CM

## 2015-06-15 DIAGNOSIS — F329 Major depressive disorder, single episode, unspecified: Secondary | ICD-10-CM

## 2015-06-15 DIAGNOSIS — K219 Gastro-esophageal reflux disease without esophagitis: Secondary | ICD-10-CM

## 2015-06-15 LAB — CBC
HEMATOCRIT: 35.6 % — AB (ref 36.0–46.0)
Hemoglobin: 11.3 g/dL — ABNORMAL LOW (ref 12.0–15.0)
MCH: 26.6 pg (ref 26.0–34.0)
MCHC: 31.7 g/dL (ref 30.0–36.0)
MCV: 83.8 fL (ref 78.0–100.0)
PLATELETS: 192 10*3/uL (ref 150–400)
RBC: 4.25 MIL/uL (ref 3.87–5.11)
RDW: 14.9 % (ref 11.5–15.5)
WBC: 8 10*3/uL (ref 4.0–10.5)

## 2015-06-15 LAB — GLUCOSE, CAPILLARY
GLUCOSE-CAPILLARY: 165 mg/dL — AB (ref 65–99)
Glucose-Capillary: 176 mg/dL — ABNORMAL HIGH (ref 65–99)
Glucose-Capillary: 220 mg/dL — ABNORMAL HIGH (ref 65–99)

## 2015-06-15 LAB — BASIC METABOLIC PANEL
ANION GAP: 8 (ref 5–15)
BUN: 12 mg/dL (ref 6–20)
CALCIUM: 8.9 mg/dL (ref 8.9–10.3)
CHLORIDE: 99 mmol/L — AB (ref 101–111)
CO2: 32 mmol/L (ref 22–32)
Creatinine, Ser: 0.98 mg/dL (ref 0.44–1.00)
GFR calc Af Amer: >60 mL/min (ref >60)
GFR calc non Af Amer: >60 mL/min (ref >60)
GLUCOSE: 238 mg/dL — AB (ref 65–99)
Potassium: 4 mmol/L (ref 3.5–5.1)
Sodium: 139 mmol/L (ref 135–145)

## 2015-06-15 MED ORDER — BUDESONIDE 0.25 MG/2ML IN SUSP
0.2500 mg | Freq: Two times a day (BID) | RESPIRATORY_TRACT | Status: DC
  Administered 2015-06-15 – 2015-06-16 (×3): 0.25 mg via RESPIRATORY_TRACT
  Filled 2015-06-15 (×4): qty 2

## 2015-06-15 MED ORDER — ACETAMINOPHEN 325 MG PO TABS
650.0000 mg | ORAL_TABLET | Freq: Four times a day (QID) | ORAL | Status: DC | PRN
  Administered 2015-06-15 (×2): 650 mg via ORAL
  Filled 2015-06-15 (×2): qty 2

## 2015-06-15 MED ORDER — PANTOPRAZOLE SODIUM 40 MG PO TBEC
40.0000 mg | DELAYED_RELEASE_TABLET | Freq: Every day | ORAL | Status: DC
  Administered 2015-06-15 – 2015-06-16 (×2): 40 mg via ORAL
  Filled 2015-06-15 (×2): qty 1

## 2015-06-15 MED ORDER — INSULIN ASPART 100 UNIT/ML ~~LOC~~ SOLN
0.0000 [IU] | Freq: Three times a day (TID) | SUBCUTANEOUS | Status: DC
  Administered 2015-06-15 (×2): 3 [IU] via SUBCUTANEOUS
  Administered 2015-06-16: 8 [IU] via SUBCUTANEOUS
  Administered 2015-06-16: 3 [IU] via SUBCUTANEOUS

## 2015-06-15 MED ORDER — FUROSEMIDE 40 MG PO TABS
40.0000 mg | ORAL_TABLET | Freq: Two times a day (BID) | ORAL | Status: DC
  Administered 2015-06-15 – 2015-06-16 (×3): 40 mg via ORAL
  Filled 2015-06-15 (×5): qty 1

## 2015-06-15 MED ORDER — DOXYCYCLINE HYCLATE 100 MG PO TABS
100.0000 mg | ORAL_TABLET | Freq: Two times a day (BID) | ORAL | Status: DC
  Administered 2015-06-15 – 2015-06-16 (×3): 100 mg via ORAL
  Filled 2015-06-15 (×4): qty 1

## 2015-06-15 MED ORDER — METHYLPREDNISOLONE SODIUM SUCC 125 MG IJ SOLR
60.0000 mg | Freq: Two times a day (BID) | INTRAMUSCULAR | Status: DC
  Administered 2015-06-15 – 2015-06-16 (×3): 60 mg via INTRAVENOUS
  Filled 2015-06-15: qty 2
  Filled 2015-06-15: qty 0.96
  Filled 2015-06-15: qty 2
  Filled 2015-06-15: qty 0.96

## 2015-06-15 NOTE — Progress Notes (Signed)
[facility] PROGRESS NOTE  [name] ZOX:[id number] DOB: Jul 12, 1961 DOA: 06/14/2015 PCP: Maggie Font, MD  Assessment/Plan: 1-acute on chronic resp failure: due to COPD exacerbation -still wheezing and with SOB; but better and moving more air -will continue abx's, continue solumedrol and nebulizer therapy -will add mucinex and flutter valve -continue oxygen supplementation -patient most likely with OSA and OHS contributing to her SOB  2-HTN: stable. Will continue current antihypertensive regimen  3-GERD: continue PPI  4-morbid obesity: Body mass index is 66.11 kg/(m^2). -low calorie diet discussed with patient -encourage to visit bariatric clinic  5-sleep apnea: on oxygen instead of CPAP at night  6-depression: continue celexa  7-HLD: continue zetia  8-chronic diastolic heart failure: stable and compensated -continue home medication regimen and low sodium diet -will follow daily weights and strict intake/output  9-diabetes type 2: will start SSI -continue glipizide   Code Status: Full Family Communication: no family at bedside Disposition Plan: home when breathing better    Consultants:  None   Procedures:  See below for x-ray reports   Antibiotics:  Doxycycline   HPI/Subjective: Afebrile, feeling somewhat better, but still with significant wheezing and SOB.  Objective: Filed Vitals:   06/15/15 1445  BP: 106/42  Pulse: 75  Temp: 98.4 F (36.9 C)  Resp: 20    Intake/Output Summary (Last 24 hours) at 06/15/15 1828 Last data filed at 06/15/15 1700  Gross per 24 hour  Intake    480 ml  Output    800 ml  Net   -320 ml   Filed Weights   06/15/15 0100  Weight: 153.543 kg (338 lb 8 oz)    Exam:   General:  Obese, in no distress, still with complaints of SOB and wheezing, but feeling better. No CP and afebrile  Cardiovascular: S1 and S2, no rubs or gallops  Respiratory: scattered rhonchi, diffuse exp wheezing, no  crackles  Abdomen: obese, soft, NT, positive BS  Musculoskeletal: 2+ edema and signs of chronic venous insufficiency   Data Reviewed: Basic Metabolic Panel:  Recent Labs Lab 06/14/15 1852 06/15/15 0340  NA 137 139  K 4.0 4.0  CL 100* 99*  CO2 28 32  GLUCOSE 145* 238*  BUN 12 12  CREATININE 0.91 0.98  CALCIUM 8.9 8.9   CBC:  Recent Labs Lab 06/14/15 1852 06/15/15 0340  WBC 8.9 8.0  NEUTROABS 5.6  --   HGB 11.5* 11.3*  HCT 36.4 35.6*  MCV 83.5 83.8  PLT 219 192   CBG:  Recent Labs Lab 06/14/15 2113 06/15/15 1149 06/15/15 1621  GLUCAP 145* 165* 176*   Studies: Dg Chest 2 View (if Patient Has Fever And/or Copd)  06/14/2015   CLINICAL DATA:  Congestion, productive cough for 3 days  EXAM: CHEST  2 VIEW  COMPARISON:  12/30/2014  FINDINGS: Bilateral diffuse interstitial thickening and peribronchial cuffing most concerning for bronchitis. There is no focal parenchymal opacity. There is no pleural effusion or pneumothorax. The heart and mediastinal contours are unremarkable. There is evidence of prior aortic valve replacement.  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  IMPRESSION: 1. Bilateral diffuse interstitial thickening and peribronchial cuffing most concerning for bronchitis.   Electronically Signed   By: Kathreen Devoid   On: 06/14/2015 19:42    Scheduled Meds: . aspirin  325 mg Oral Daily  . atorvastatin  80 mg Oral q1800  . budesonide (PULMICORT) nebulizer solution  0.25 mg Nebulization BID  . citalopram  20 mg Oral Daily  .  clopidogrel  75 mg Oral q morning - 10a  . doxycycline  100 mg Oral Q12H  . ezetimibe  10 mg Oral Daily  . furosemide  40 mg Oral BID  . gabapentin  300 mg Oral BID  . glipiZIDE  5 mg Oral QAC supper  . heparin  5,000 Units Subcutaneous 3 times per day  . insulin aspart  0-15 Units Subcutaneous TID WC  . losartan  50 mg Oral Q breakfast  . methylPREDNISolone (SOLU-MEDROL) injection  60 mg Intravenous Q12H  .  metoprolol tartrate  25 mg Oral BID  . pantoprazole  40 mg Oral Q1200  . potassium chloride SA  40 mEq Oral Daily   Continuous Infusions:   Principal Problem:   COPD exacerbation Active Problems:   DM (diabetes mellitus)   COPD (chronic obstructive pulmonary disease)   Essential hypertension   Acute bronchitis    Time spent: 30 minutes    Barton Dubois  [facility] Pager [phone]. If 7PM-7AM, please contact night-coverage at [email or web address], password [facility] 06/15/2015, 6:28 PM  LOS: 1 day

## 2015-06-15 NOTE — Care Management (Signed)
Utilization Review Completed Dilia Alemany J. Ronny Ruddell, RN, BSN, NCM [phone]  

## 2015-06-15 NOTE — ED Notes (Signed)
Attempted to call report

## 2015-06-15 NOTE — Progress Notes (Signed)
Nutrition Brief Note  Patient identified on the Malnutrition Screening Tool (MST) Report  Wt Readings from Last 15 Encounters:  06/15/15 338 lb 8 oz (153.543 kg)  01/26/15 348 lb (157.852 kg)  12/30/14 348 lb (157.852 kg)  12/16/14 348 lb (157.852 kg)  12/08/14 348 lb 5.2 oz (158 kg)  12/05/14 354 lb 9.6 oz (160.846 kg)  11/15/14 350 lb (158.759 kg)  11/14/14 352 lb (159.666 kg)  10/26/14 350 lb (158.759 kg)  10/18/14 325 lb (147.419 kg)  09/27/14 354 lb (160.573 kg)  09/14/14 324 lb (146.965 kg)  09/07/14 354 lb (160.573 kg)  08/03/14 352 lb (159.666 kg)  07/13/14 355 lb (161.027 kg)    Body mass index is 66.11 kg/(m^2). Patient meets criteria for Morbid Obesity based on current BMI. Per chart, pt is unsure if she has lost weight; denies eating poorly due to a decreased appetite.   Current diet order is Carb Modified, patient is consuming approximately 100% of meals at this time. Labs and medications reviewed.   No nutrition interventions warranted at this time. If nutrition issues arise, please consult RD.   Pryor Ochoa RD, LDN Inpatient Clinical Dietitian Pager: [phone] After Hours Pager: [phone]

## 2015-06-15 NOTE — Progress Notes (Signed)
Admitted pt from ED AAOx3. Oriented to bed and call bell.VS takebn and recorded. Tele monitor on.

## 2015-06-16 DIAGNOSIS — J9621 Acute and chronic respiratory failure with hypoxia: Secondary | ICD-10-CM | POA: Insufficient documentation

## 2015-06-16 DIAGNOSIS — J209 Acute bronchitis, unspecified: Secondary | ICD-10-CM

## 2015-06-16 DIAGNOSIS — K219 Gastro-esophageal reflux disease without esophagitis: Secondary | ICD-10-CM | POA: Insufficient documentation

## 2015-06-16 LAB — BASIC METABOLIC PANEL
Anion gap: 9 (ref 5–15)
BUN: 12 mg/dL (ref 6–20)
CHLORIDE: 98 mmol/L — AB (ref 101–111)
CO2: 31 mmol/L (ref 22–32)
CREATININE: 0.79 mg/dL (ref 0.44–1.00)
Calcium: 8.9 mg/dL (ref 8.9–10.3)
GFR calc Af Amer: >60 mL/min (ref >60)
GFR calc non Af Amer: >60 mL/min (ref >60)
Glucose, Bld: 189 mg/dL — ABNORMAL HIGH (ref 65–99)
Potassium: 4.5 mmol/L (ref 3.5–5.1)
Sodium: 138 mmol/L (ref 135–145)

## 2015-06-16 LAB — GLUCOSE, CAPILLARY
Glucose-Capillary: 159 mg/dL — ABNORMAL HIGH (ref 65–99)
Glucose-Capillary: 271 mg/dL — ABNORMAL HIGH (ref 65–99)

## 2015-06-16 LAB — CBC
HCT: 36.4 % (ref 36.0–46.0)
Hemoglobin: 11.6 g/dL — ABNORMAL LOW (ref 12.0–15.0)
MCH: 26.2 pg (ref 26.0–34.0)
MCHC: 31.9 g/dL (ref 30.0–36.0)
MCV: 82.2 fL (ref 78.0–100.0)
PLATELETS: 216 10*3/uL (ref 150–400)
RBC: 4.43 MIL/uL (ref 3.87–5.11)
RDW: 14.5 % (ref 11.5–15.5)
WBC: 12.6 10*3/uL — AB (ref 4.0–10.5)

## 2015-06-16 LAB — HEMOGLOBIN A1C
Hgb A1c MFr Bld: 7 % — ABNORMAL HIGH (ref 4.8–5.6)
MEAN PLASMA GLUCOSE: 154 mg/dL

## 2015-06-16 MED ORDER — GUAIFENESIN ER 600 MG PO TB12: 600.0000 mg | ORAL_TABLET | Freq: Two times a day (BID) | ORAL | Status: DC

## 2015-06-16 MED ORDER — DOXYCYCLINE HYCLATE 100 MG PO TABS: 100.0000 mg | ORAL_TABLET | Freq: Two times a day (BID) | ORAL | Status: DC

## 2015-06-16 MED ORDER — PANTOPRAZOLE SODIUM 40 MG PO TBEC: 40.0000 mg | DELAYED_RELEASE_TABLET | Freq: Every day | ORAL | Status: DC

## 2015-06-16 MED ORDER — PREDNISONE 20 MG PO TABS: ORAL_TABLET | ORAL | Status: DC

## 2015-06-16 NOTE — Discharge Summary (Signed)
Physician Discharge Summary  [name] JFH:[id number] DOB: Jul 05, 1961 DOA: 06/14/2015  PCP: Maggie Font, MD  Admit date: 06/14/2015 Discharge date: 06/16/2015  Time spent: >30 minutes  Recommendations for Outpatient Follow-up:  1. Reassess patient breathing; if needed arrange follow up with pulmonologist for adjustmen tin maintenance treatment. 2. Close follow up to CBG's ad A1C as expecting increase in her CBG's with steroids use 3. BMET to follow electrolytes and renal function.  Discharge Diagnoses:  Acute on chronic resp failure COPD exacerbation DM (diabetes mellitus) Essential hypertension Acute bronchitis/bronchiectasis GERD Morbid obesity HTN Chronic diastolic heart failure Depression    Discharge Condition: stable and improved. Will discharge home with prednisone tapering, antibiotics by mouth and use of mucinex/flutter valve. Patient advise to continue nebulizer/inhaler therapy and oxygen supplementation. Follow up with PCP in 10 days  Diet recommendation: low calorie, heart healthy and modified carbohydrates diet   Filed Weights   06/15/15 0100 06/16/15 0636  Weight: 153.543 kg (338 lb 8 oz) 155.674 kg (343 lb 3.2 oz)    History of present illness:  54 y.o. female with h/o COPD, on 3L home O2 as needed at home. Patient presents to ED with c/o cough, SOB. Symptoms have been worsening over the past 3 days. Cough productive of yellowish sputum. Associated wheezing. Neb treatments at home not helping much. No fever, chills, N/V.  Hospital Course:  1-acute on chronic resp failure: due to COPD exacerbation -still with minimal exp wheezing, but feeling much better and with increase air movement.  -will continue abx's for bronchitis/bronchiectasis, prednisone tapering and continue and nebulizer/inhaler  therapy -will continue mucinex and use of flutter valve -continue oxygen supplementation (3Lthrough [place]) -patient, most likely with OSA and OHS  contributing to her SOB  2-HTN: stable and well cotrolled.  -Will continue current antihypertensive regimen -encourage to follow low sodium diet  3-GERD: continue PPI  4-morbid obesity: Body mass index is 66.11 kg/(m^2). -low calorie diet and increase exercise level discussed with patient -encourage to visit bariatric clinic for further assistance with weight loss  5-sleep apnea: on oxygen instead of CPAP at night  6-depression: continue celexa -no hallucinations or SI  7-HLD: continue statins and zetia  8-chronic diastolic heart failure: stable and compensated -continue home medication regimen and low sodium diet -advise to check her weight on daily basis -no crackles on exam  9-diabetes type 2:  -continue home hypoglycemic regimen and low carbohydrates diet -will expect increase in her CBG's while on steroids   Procedures:  See below for x-ray reports   Consultations:  None   Discharge Exam: Filed Vitals:   06/16/15 0636  BP: 126/55  Pulse: 73  Temp: 97.7 F (36.5 C)  Resp: 18    General: Obese, in no distress, able to speak in full sentences and with good O2 sat on chronic oxygen supplementation level. No CP and afebrile  Cardiovascular: S1 and S2, no rubs or gallops  Respiratory: scattered rhonchi, minimal exp wheezing, no crackles and improved air movement   Abdomen: obese, soft, NT, positive BS  Musculoskeletal: 2+ edema and signs of chronic venous insufficiency (unchanged according to patient)   Discharge Instructions   Discharge Instructions    Diet - low sodium heart healthy    Complete by:  As directed      Discharge instructions    Complete by:  As directed   Take medications as prescribed Arrange follow up with PCP in 10 days Maintain yourself well hydrated Follow low sodium and low calorie  diet          Current Discharge Medication List    START taking these medications   Details  doxycycline (VIBRA-TABS) 100 MG tablet Take 1  tablet (100 mg total) by mouth every 12 (twelve) hours. Qty: 16 tablet, Refills: 0    guaiFENesin (MUCINEX) 600 MG 12 hr tablet Take 1 tablet (600 mg total) by mouth 2 (two) times daily. Qty: 40 tablet, Refills: 0    pantoprazole (PROTONIX) 40 MG tablet Take 1 tablet (40 mg total) by mouth daily at 12 noon. Qty: 30 tablet, Refills: 1    predniSONE (DELTASONE) 20 MG tablet Take 3 tablets by mouth daily X 1 days, then 2 tablets by mouth daily X 3 days; then 1 tablet by mouth daily X 2 days, then 1/2 tablet by mouth daily X 2 days and stop prednisone Qty: 12 tablet, Refills: 0      CONTINUE these medications which have NOT CHANGED   Details  albuterol (PROVENTIL HFA;VENTOLIN HFA) 108 (90 BASE) MCG/ACT inhaler Inhale 2 puffs into the lungs every 4 (four) hours as needed for wheezing or shortness of breath.     albuterol (PROVENTIL) (2.5 MG/3ML) 0.083% nebulizer solution Take 2.5 mg by nebulization every 4 (four) hours as needed for wheezing or shortness of breath.     aspirin 325 MG tablet Take 325 mg by mouth daily.    atorvastatin (LIPITOR) 80 MG tablet Take 80 mg by mouth daily.    budesonide-formoterol (SYMBICORT) 160-4.5 MCG/ACT inhaler Inhale 2 puffs into the lungs 2 (two) times daily.    citalopram (CELEXA) 20 MG tablet Take 20 mg by mouth daily.    clopidogrel (PLAVIX) 75 MG tablet Take 75 mg by mouth every morning.     ezetimibe (ZETIA) 10 MG tablet Take 10 mg by mouth daily.    furosemide (LASIX) 40 MG tablet Take 40 mg by mouth 2 (two) times daily.    gabapentin (NEURONTIN) 300 MG capsule Take 300 mg by mouth 2 (two) times daily.     glipiZIDE (GLUCOTROL) 5 MG tablet Take 5 mg by mouth daily before supper.     losartan (COZAAR) 50 MG tablet Take 1 tablet (50 mg total) by mouth daily with breakfast. Qty: 30 tablet, Refills: 1    MAGNESIUM PO Take 1-2 tablets by mouth daily as needed (constipation).     metoprolol tartrate (LOPRESSOR) 25 MG tablet Take 1 tablet (25 mg  total) by mouth 2 (two) times daily. Qty: 180 tablet, Refills: 3    potassium chloride SA (K-DUR,KLOR-CON) 20 MEQ tablet Take 40 mEq by mouth daily.    OXYGEN-HELIUM IN Inhale 3 L into the lungs daily.       STOP taking these medications     aspirin EC 81 MG EC tablet        No Known Allergies Follow-up Information    Follow up with HILL,GERALD K, MD. Schedule an appointment as soon as possible for a visit in 10 days.   Specialty:  Family Medicine   Contact information:   [address] [place] [address] [phone]        The results of significant diagnostics from this hospitalization (including imaging, microbiology, ancillary and laboratory) are listed below for reference.    Significant Diagnostic Studies: Dg Chest 2 View (if Patient Has Fever And/or Copd)  06/14/2015   CLINICAL DATA:  Congestion, productive cough for 3 days  EXAM: CHEST  2 VIEW  COMPARISON:  12/30/2014  FINDINGS: Bilateral diffuse interstitial thickening and peribronchial cuffing most concerning for bronchitis. There is no focal parenchymal opacity. There is no pleural effusion or pneumothorax. The heart and mediastinal contours are unremarkable. There is evidence of prior aortic valve replacement.  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  IMPRESSION: 1. Bilateral diffuse interstitial thickening and peribronchial cuffing most concerning for bronchitis.   Electronically Signed   By: Kathreen Devoid   On: 06/14/2015 19:42   Labs: Basic Metabolic Panel:  Recent Labs Lab 06/14/15 1852 06/15/15 0340 06/16/15 0547  NA 137 139 138  K 4.0 4.0 4.5  CL 100* 99* 98*  CO2 28 32 31  GLUCOSE 145* 238* 189*  BUN 12 12 12   CREATININE 0.91 0.98 0.79  CALCIUM 8.9 8.9 8.9   CBC:  Recent Labs Lab 06/14/15 1852 06/15/15 0340 06/16/15 0547  WBC 8.9 8.0 12.6*  NEUTROABS 5.6  --   --   HGB 11.5* 11.3* 11.6*  HCT 36.4 35.6* 36.4  MCV 83.5 83.8 82.2  PLT 219 192 216    CBG:  Recent Labs Lab 06/15/15 1149 06/15/15 1621 06/15/15 2118 06/16/15 0720 06/16/15 1113  GLUCAP 165* 176* 220* 159* 271*    Signed:  Barton Dubois  [facility] 06/16/2015, 12:54 PM

## 2015-07-03 ENCOUNTER — Ambulatory Visit: Payer: Medicaid Other | Admitting: Nurse Practitioner

## 2015-08-06 ENCOUNTER — Emergency Department (HOSPITAL_COMMUNITY): Payer: Medicaid Other

## 2015-08-06 ENCOUNTER — Inpatient Hospital Stay (HOSPITAL_COMMUNITY)
Admission: EM | Admit: 2015-08-06 | Discharge: 2015-08-08 | DRG: 190 | Disposition: A | Discharge status: Home / Self Care | Payer: Medicaid Other | Attending: Internal Medicine | Admitting: Internal Medicine

## 2015-08-06 ENCOUNTER — Encounter (HOSPITAL_COMMUNITY): Payer: Self-pay | Admitting: Neurology

## 2015-08-06 DIAGNOSIS — Z87891 Personal history of nicotine dependence: Secondary | ICD-10-CM | POA: Diagnosis not present

## 2015-08-06 DIAGNOSIS — Z9981 Dependence on supplemental oxygen: Secondary | ICD-10-CM

## 2015-08-06 DIAGNOSIS — T380X5A Adverse effect of glucocorticoids and synthetic analogues, initial encounter: Secondary | ICD-10-CM | POA: Diagnosis present

## 2015-08-06 DIAGNOSIS — I5030 Unspecified diastolic (congestive) heart failure: Secondary | ICD-10-CM | POA: Diagnosis present

## 2015-08-06 DIAGNOSIS — Z955 Presence of coronary angioplasty implant and graft: Secondary | ICD-10-CM | POA: Diagnosis not present

## 2015-08-06 DIAGNOSIS — Z96651 Presence of right artificial knee joint: Secondary | ICD-10-CM | POA: Diagnosis present

## 2015-08-06 DIAGNOSIS — E1165 Type 2 diabetes mellitus with hyperglycemia: Secondary | ICD-10-CM | POA: Diagnosis present

## 2015-08-06 DIAGNOSIS — Z7982 Long term (current) use of aspirin: Secondary | ICD-10-CM

## 2015-08-06 DIAGNOSIS — E785 Hyperlipidemia, unspecified: Secondary | ICD-10-CM | POA: Diagnosis present

## 2015-08-06 DIAGNOSIS — Z86711 Personal history of pulmonary embolism: Secondary | ICD-10-CM | POA: Diagnosis not present

## 2015-08-06 DIAGNOSIS — J45909 Unspecified asthma, uncomplicated: Secondary | ICD-10-CM | POA: Diagnosis present

## 2015-08-06 DIAGNOSIS — F32A Depression, unspecified: Secondary | ICD-10-CM | POA: Diagnosis present

## 2015-08-06 DIAGNOSIS — I5032 Chronic diastolic (congestive) heart failure: Secondary | ICD-10-CM | POA: Diagnosis not present

## 2015-08-06 DIAGNOSIS — J441 Chronic obstructive pulmonary disease with (acute) exacerbation: Secondary | ICD-10-CM | POA: Diagnosis present

## 2015-08-06 DIAGNOSIS — I5033 Acute on chronic diastolic (congestive) heart failure: Secondary | ICD-10-CM | POA: Diagnosis present

## 2015-08-06 DIAGNOSIS — F329 Major depressive disorder, single episode, unspecified: Secondary | ICD-10-CM | POA: Diagnosis present

## 2015-08-06 DIAGNOSIS — K219 Gastro-esophageal reflux disease without esophagitis: Secondary | ICD-10-CM

## 2015-08-06 DIAGNOSIS — J9621 Acute and chronic respiratory failure with hypoxia: Secondary | ICD-10-CM | POA: Diagnosis present

## 2015-08-06 DIAGNOSIS — Z953 Presence of xenogenic heart valve: Secondary | ICD-10-CM

## 2015-08-06 DIAGNOSIS — I2510 Atherosclerotic heart disease of native coronary artery without angina pectoris: Secondary | ICD-10-CM | POA: Diagnosis present

## 2015-08-06 DIAGNOSIS — E1159 Type 2 diabetes mellitus with other circulatory complications: Secondary | ICD-10-CM | POA: Diagnosis present

## 2015-08-06 DIAGNOSIS — Z954 Presence of other heart-valve replacement: Secondary | ICD-10-CM

## 2015-08-06 DIAGNOSIS — Z6841 Body Mass Index (BMI) 40.0 and over, adult: Secondary | ICD-10-CM

## 2015-08-06 DIAGNOSIS — I350 Nonrheumatic aortic (valve) stenosis: Secondary | ICD-10-CM

## 2015-08-06 DIAGNOSIS — H109 Unspecified conjunctivitis: Secondary | ICD-10-CM | POA: Diagnosis present

## 2015-08-06 DIAGNOSIS — I152 Hypertension secondary to endocrine disorders: Secondary | ICD-10-CM | POA: Diagnosis present

## 2015-08-06 DIAGNOSIS — I10 Essential (primary) hypertension: Secondary | ICD-10-CM

## 2015-08-06 DIAGNOSIS — Z952 Presence of prosthetic heart valve: Secondary | ICD-10-CM

## 2015-08-06 DIAGNOSIS — E119 Type 2 diabetes mellitus without complications: Secondary | ICD-10-CM

## 2015-08-06 LAB — CBC WITH DIFFERENTIAL/PLATELET
Basophils Absolute: 0 10*3/uL (ref 0.0–0.1)
Basophils Relative: 0 % (ref 0–1)
Eosinophils Absolute: 0.2 10*3/uL (ref 0.0–0.7)
Eosinophils Relative: 3 % (ref 0–5)
HCT: 38.5 % (ref 36.0–46.0)
HEMOGLOBIN: 12.6 g/dL (ref 12.0–15.0)
LYMPHS ABS: 1.6 10*3/uL (ref 0.7–4.0)
Lymphocytes Relative: 19 % (ref 12–46)
MCH: 27.5 pg (ref 26.0–34.0)
MCHC: 32.7 g/dL (ref 30.0–36.0)
MCV: 83.9 fL (ref 78.0–100.0)
Monocytes Absolute: 0.7 10*3/uL (ref 0.1–1.0)
Monocytes Relative: 8 % (ref 3–12)
NEUTROS ABS: 5.9 10*3/uL (ref 1.7–7.7)
Neutrophils Relative %: 70 % (ref 43–77)
PLATELETS: 168 10*3/uL (ref 150–400)
RBC: 4.59 MIL/uL (ref 3.87–5.11)
RDW: 14.7 % (ref 11.5–15.5)
WBC: 8.4 10*3/uL (ref 4.0–10.5)

## 2015-08-06 LAB — BASIC METABOLIC PANEL
Anion gap: 12 (ref 5–15)
BUN: 8 mg/dL (ref 6–20)
CALCIUM: 9 mg/dL (ref 8.9–10.3)
CHLORIDE: 97 mmol/L — AB (ref 101–111)
CO2: 29 mmol/L (ref 22–32)
CREATININE: 0.81 mg/dL (ref 0.44–1.00)
GFR calc Af Amer: >60 mL/min (ref >60)
GFR calc non Af Amer: >60 mL/min (ref >60)
GLUCOSE: 248 mg/dL — AB (ref 65–99)
Potassium: 4 mmol/L (ref 3.5–5.1)
SODIUM: 138 mmol/L (ref 135–145)

## 2015-08-06 LAB — BRAIN NATRIURETIC PEPTIDE: B Natriuretic Peptide: 40.5 pg/mL (ref 0.0–100.0)

## 2015-08-06 LAB — I-STAT TROPONIN, ED: TROPONIN I, POC: 0.04 ng/mL (ref 0.00–0.08)

## 2015-08-06 IMAGING — CR DG CHEST 2V
2 series · 2 of 2 positions shown · non-contrast
Comparison: [DATE]

CLINICAL DATA: Shortness of breath with productive cough

EXAM:
CHEST  2 VIEW

[chest pa]
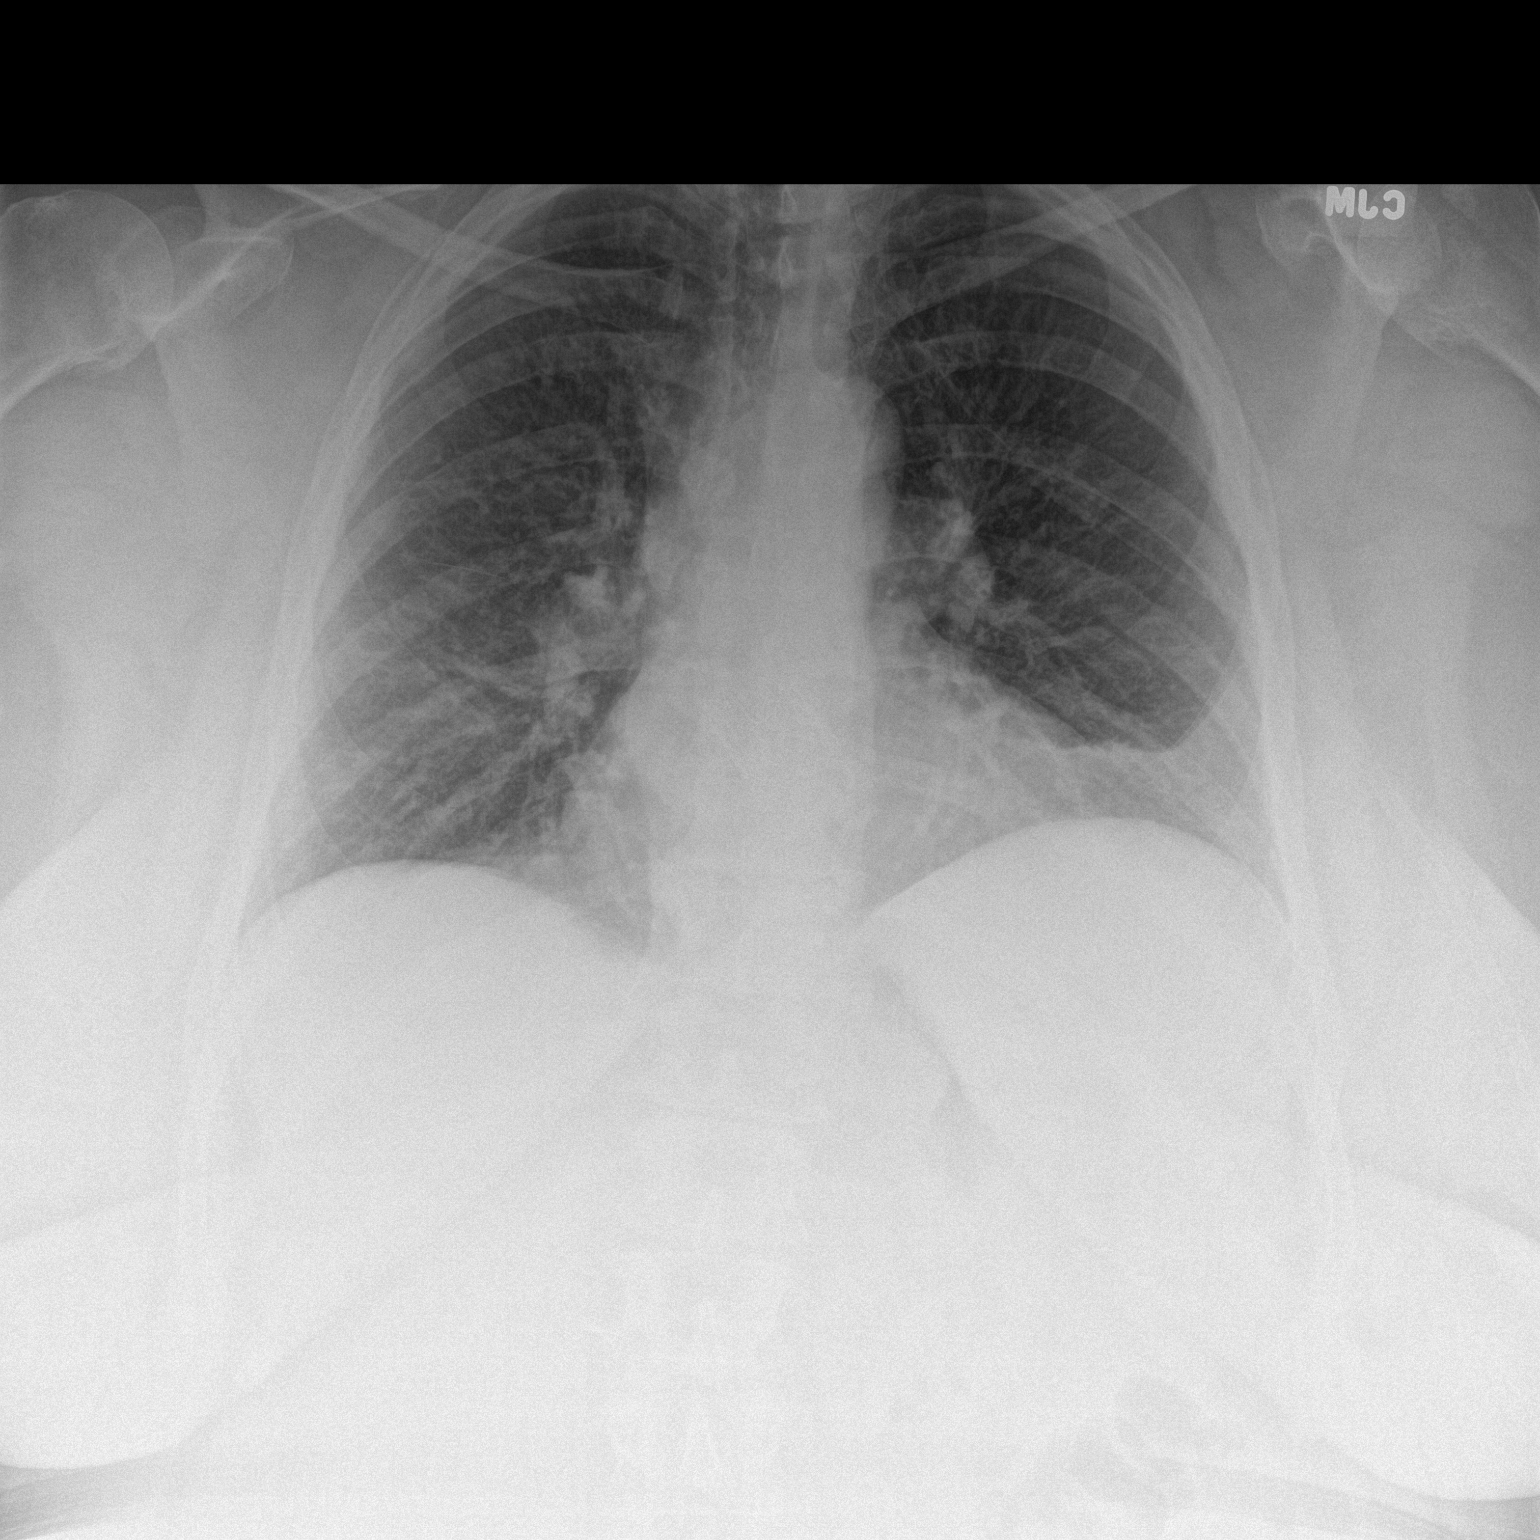

[chest lat]
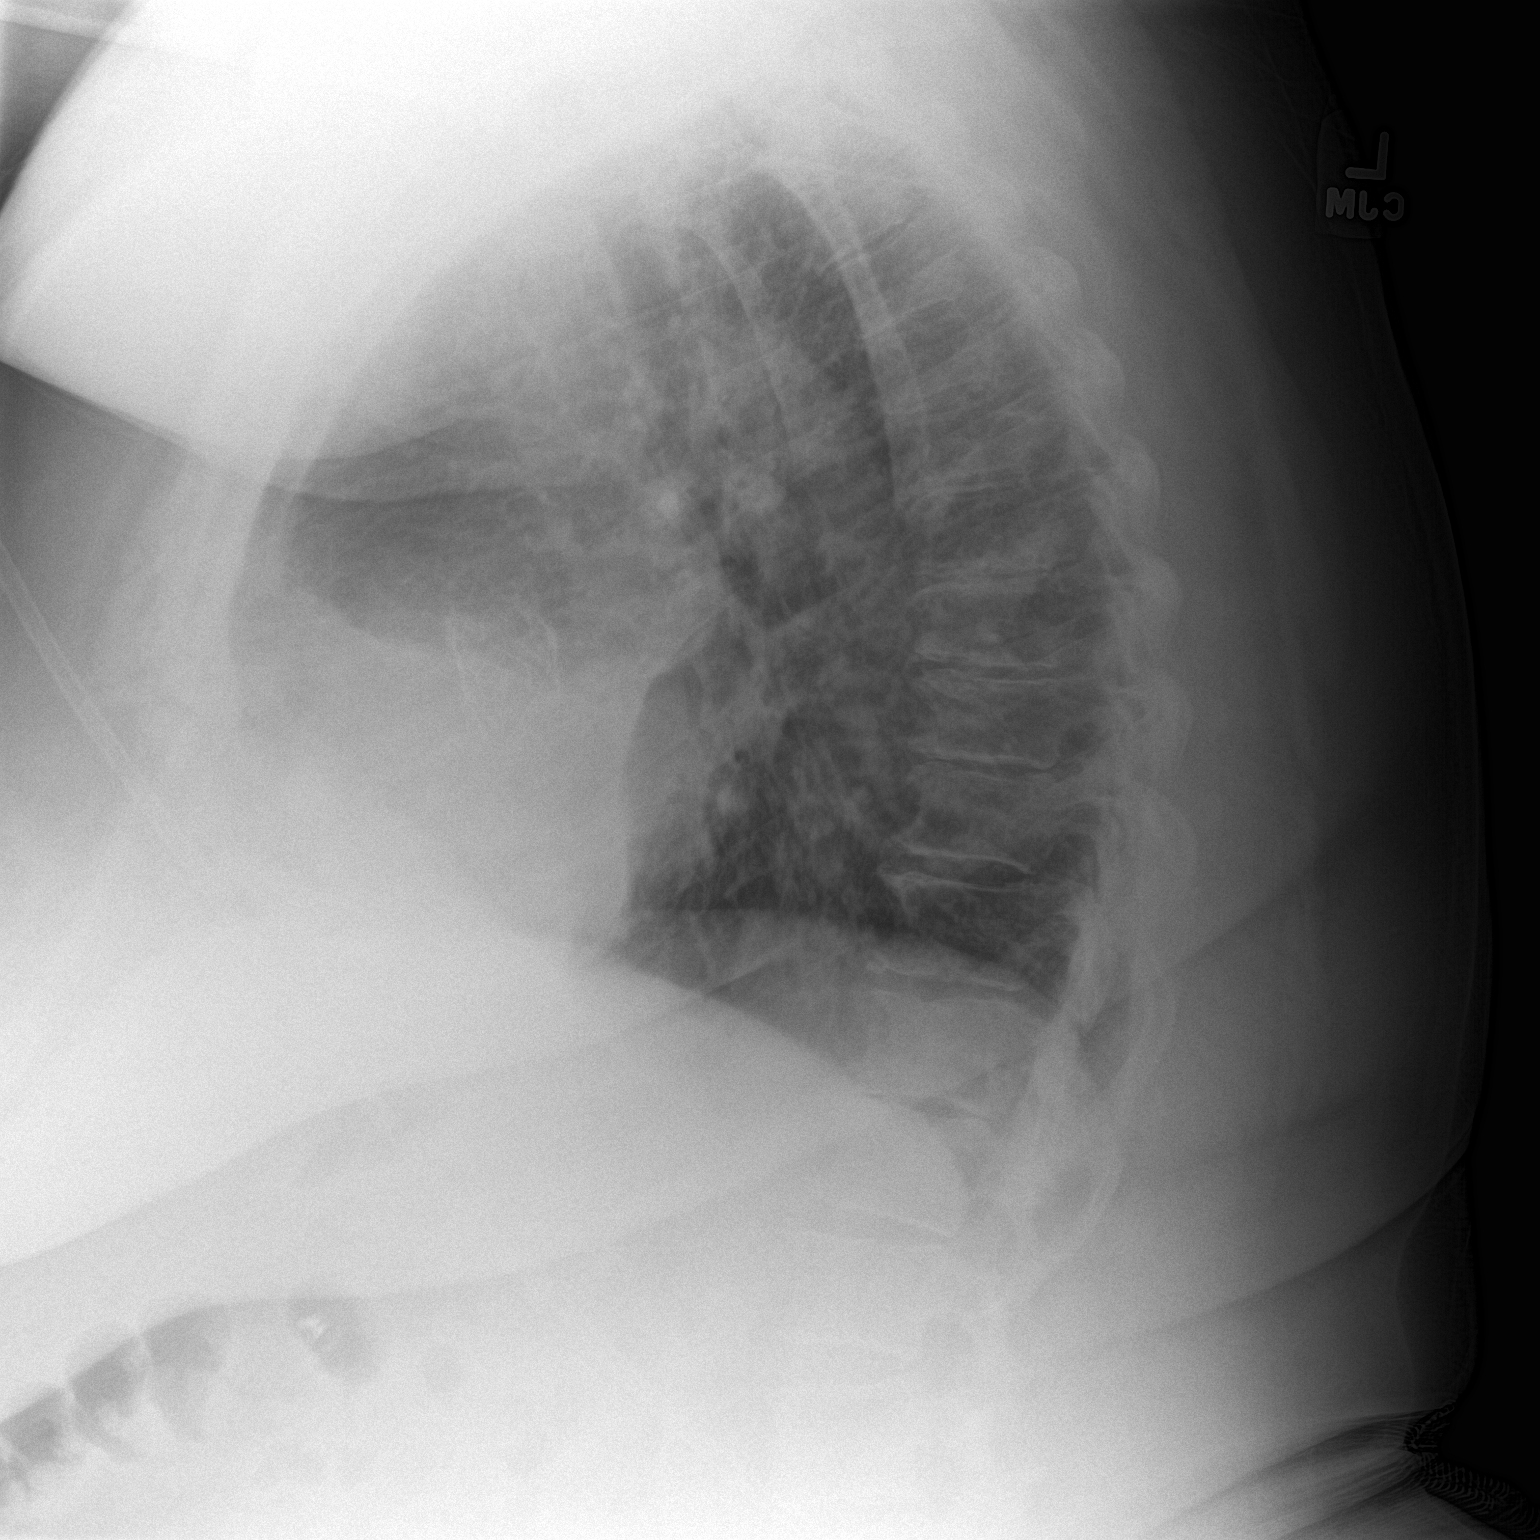

[2 of 2 positions shown; findings below may reference images not displayed]

FINDINGS: There is mild scarring in the left base. There is no edema or
consolidation. Heart size and pulmonary vascularity are normal. No
adenopathy. There is a prosthetic aortic valve. No bone lesions.
There is mild degenerative change in the thoracic spine.
IMPRESSION: Mild scarring left base. No edema or consolidation. Prosthetic
aortic valve present.

## 2015-08-06 MED ORDER — ALBUTEROL (5 MG/ML) CONTINUOUS INHALATION SOLN
10.0000 mg/h | INHALATION_SOLUTION | Freq: Once | RESPIRATORY_TRACT | Status: AC
  Administered 2015-08-06: 10 mg/h via RESPIRATORY_TRACT
  Filled 2015-08-06: qty 20

## 2015-08-06 MED ORDER — ALBUTEROL SULFATE (2.5 MG/3ML) 0.083% IN NEBU
5.0000 mg | INHALATION_SOLUTION | Freq: Once | RESPIRATORY_TRACT | Status: AC
  Administered 2015-08-06: 5 mg via RESPIRATORY_TRACT

## 2015-08-06 MED ORDER — ALBUTEROL SULFATE (2.5 MG/3ML) 0.083% IN NEBU
INHALATION_SOLUTION | RESPIRATORY_TRACT | Status: AC
  Filled 2015-08-06: qty 6

## 2015-08-06 MED ORDER — METHYLPREDNISOLONE SODIUM SUCC 125 MG IJ SOLR
125.0000 mg | Freq: Once | INTRAMUSCULAR | Status: AC
  Administered 2015-08-06: 125 mg via INTRAVENOUS
  Filled 2015-08-06: qty 2

## 2015-08-06 NOTE — H&P (Signed)
[facility] History and Physical  [name] PNT:[id number] DOB: [birth year] DOA: 08/06/2015  Referring physician: ED physician PCP: Maggie Font, MD  Specialists:   Chief Complaint: Productive cough and shortness breast.  HPI: [name] is a 54 y.o. female with PMH of Diabetes mellitus, hypertension, hyperlipidemia, COPD (on 3L oxygen at home), asthma, GERD, depression, history of PE, RLS, CAD and MI (s/p of stetn), hx of severe AS (s/p of prosthetic AVR), dCHF (EF 60-65%), morbid obesity, who presents with the productive cough, shortness stress.  Patient reports that he has been having worsening shortness of breath in the past 3 days. It is associated with productive cough with yellow color sputum production. She also has a runny nose, sore throat and bilateral mild ear pain, but no ear discharge. She reports that she has chills, but no fever. She has sick contact with her son who is sick with cough and nasal congestion at home. Patient does not have chest pain. Patient reports having itchy eyes with clear drainage. Both eyes have some slightly purulent drainage. No vision change. Patient does not have abdominal pain, diarrhea, symptoms of UTI, unilateral weakness, rashes. Patient states that she already quit smoking.  In ED, patient was found to have WBC 8.4, BNP 40.5, negative troponin, temperature normal, tachypnea, no tachycardia, electrolytes okay. Chest x-ray has no infiltration. Patient is admitted to inpatient for further evaluation and treatment.  Where does patient live?   At home  Can patient participate in ADLs?  Yes   Review of Systems:   General: no fevers, has chills, no changes in body weight, has poor appetite, has fatigue HEENT: no blurry vision, hearing change. Has sore throat and eye discharge. Pulm: has dyspnea, coughing, wheezing CV: no chest pain, palpitations Abd: no nausea, vomiting, abdominal pain, diarrhea, constipation GU: no dysuria,  burning on urination, increased urinary frequency, hematuria  Ext: has trace leg edema Neuro: no unilateral weakness, numbness, or tingling, no vision change or hearing loss Skin: no rash MSK: No muscle spasm, no deformity, no limitation of range of movement in spin Heme: No easy bruising.  Travel history: No recent long distant travel.  Allergy: No Known Allergies  Past Medical History  Diagnosis Date  . Asthma   . Hypertension   . Hyperlipidemia   . GERD (gastroesophageal reflux disease)   . Depression   . Blood transfusion     2013[facility]  . Headache(784.0)     h/o migraines  . Pulmonary embolism 02/08/12    S/P RT TOTAL KNEE ON 02/03/12--ON 02/08/12--DEVELOPED ACUTE SOB AND CHEST PAIN--AND DIAGNOSED WITH  PULMONARY EMBOLUS AND PNEUMONIA  . Restless leg syndrome   . Myocardial infarction     PT THINKS SHE WAS DX WITH MI AT THE TIME OF HEART STENTING  . CAD (coronary artery disease)     Cath 2010 with DES x 1 RCA-- PT'S CARDIOLOGIST IS DR. Glasco  . Uterine fibroid     NO PROBLEMS AT PRESENT FROM THE FIBROIDS-STATES SHE IS POST MENOPAUSAL-LAST MENSES 2010 EXCEPT FOR EPISODE THIS YR OF BLEEDING RELATED TO FIBROIDS.  Marland Kitchen Heart murmur   . Pneumonia 02/08/12    HOSPITALIZED AT [facility] WITH PNEUMONIA AND WITH PULMONARY EMBOLUS  . Diabetes mellitus DIAGNOSED IN2010    ORAL MEDS  . Arthritis     PAIN AND SEVERE OA LEFT KNEE ; S/P RIGHT TKA ON 02/03/12; HAS LOWER BACK PAIN-UNABLE TO STAND MORE THAN 10 MIN; ARTHRITIS "ALL OVER"  . Neck pain   .  Weakness     BOTH HANDS - S/P BILATERAL CARPAL TUNNEL RELEASE--BUT STILL HAS WEAKNESS--OFTEN DROPS THINGS  . Shortness of breath     with exertion  . Eczema     on back  . Kidney stones   . Anemia   . COPD (chronic obstructive pulmonary disease)   . Sleep apnea     uses 3 liters O2 at night   . Aortic valve stenosis, severe   . Aortic stenosis   . Morbid obesity with body mass index of 60.0-69.9 in adult   . Chronic diastolic congestive heart  failure     Past Surgical History  Procedure Laterality Date  . Tubal ligation    . Carpal tunnel release      Bilateral  . Cholecystectomy    . Hernia repair    . Cardiac catheterization    . Knee arthroplasty  02/03/2012    Procedure: COMPUTER ASSISTED TOTAL KNEE ARTHROPLASTY;  Surgeon: Mcarthur Rossetti, MD;  Location: [place];  Service: Orthopedics;  Laterality: Right;  Right total knee arthroplasty  . Coronary angioplasty      2010 has stent in place  . Total knee arthroplasty  09/10/2012    Procedure: TOTAL KNEE ARTHROPLASTY;  Surgeon: Mcarthur Rossetti, MD;  Location: [facility];  Service: Orthopedics;  Laterality: Left;  . Trigger finger release  09/10/2012    Procedure: RELEASE TRIGGER FINGER/A-1 PULLEY;  Surgeon: Mcarthur Rossetti, MD;  Location: [facility];  Service: Orthopedics;  Laterality: Right;  Right Ring Finger  . Tee without cardioversion N/A 03/14/2013    Procedure: TRANSESOPHAGEAL ECHOCARDIOGRAM (TEE);  Surgeon: Lelon Perla, MD;  Location: [facility] ENDOSCOPY;  Service: Cardiovascular;  Laterality: N/A;  . Joint replacement      bil total knees  . Total knee revision Right 07/15/2013    Procedure: REVISION ARTHROPLASTY RIGHT KNEE;  Surgeon: Mcarthur Rossetti, MD;  Location: [facility];  Service: Orthopedics;  Laterality: Right;  . Cystoscopy w/ retrogrades Right 09/21/2013    Procedure: CYSTOSCOPY WITH RIGHT RETROGRADE PYELOGRAM RIGHT DOUBLE J STENT ;  Surgeon: Fredricka Bonine, MD;  Location: [facility];  Service: Urology;  Laterality: Right;  . Cystoscopy with ureteroscopy and stent placement Right 10/25/2013    Procedure: CYSTOSCOPY RIGHT URETEROSCOPY HOLMIUM LASER LITHO AND STENT PLACEMENT;  Surgeon: Fredricka Bonine, MD;  Location: [facility];  Service: Urology;  Laterality: Right;  . Tee without cardioversion N/A 11/14/2014    Procedure: TRANSESOPHAGEAL ECHOCARDIOGRAM (TEE);  Surgeon: Lelon Perla, MD;  Location: [facility] ENDOSCOPY;  Service: Cardiovascular;   Laterality: N/A;  . Left and right heart catheterization with coronary angiogram N/A 03/17/2013    Procedure: LEFT AND RIGHT HEART CATHETERIZATION WITH CORONARY ANGIOGRAM;  Surgeon: Burnell Blanks, MD;  Location: [facility] CATH LAB;  Service: Cardiovascular;  Laterality: N/A;  . Left and right heart catheterization with coronary/graft angiogram N/A 09/14/2014    Procedure: LEFT AND RIGHT HEART CATHETERIZATION WITH Beatrix Fetters;  Surgeon: Burnell Blanks, MD;  Location: [facility] CATH LAB;  Service: Cardiovascular;  Laterality: N/A;  . Transcatheter aortic valve replacement, transfemoral N/A 12/12/2014    Procedure: TRANSCATHETER AORTIC VALVE REPLACEMENT, TRANSFEMORAL;  Surgeon: Burnell Blanks, MD;  Location: [place];  Service: Cardiovascular;  Laterality: N/A;  . Intraoperative transesophageal echocardiogram N/A 12/12/2014    Procedure: INTRAOPERATIVE TRANSESOPHAGEAL ECHOCARDIOGRAM;  Surgeon: Burnell Blanks, MD;  Location: [facility];  Service: Cardiovascular;  Laterality: N/A;    Social History:  reports that she quit smoking  about 14 years ago. Her smoking use included Cigarettes. She has a 45 pack-year smoking history. She has never used smokeless tobacco. She reports that she drinks alcohol. She reports that she does not use illicit drugs.  Family History:  Family History  Problem Relation Age of Onset  . Breast cancer Mother   . COPD Father     smoked  . Cancer Brother     Sinus  . Emphysema Mother     smoked  . Asthma Father   . Heart disease Mother   . Heart disease Father      Prior to Admission medications   Medication Sig Start Date End Date Taking? Authorizing Provider  albuterol (PROVENTIL HFA;VENTOLIN HFA) 108 (90 BASE) MCG/ACT inhaler Inhale 2 puffs into the lungs every 4 (four) hours as needed for wheezing or shortness of breath.     Historical Provider, MD  albuterol (PROVENTIL) (2.5 MG/3ML) 0.083% nebulizer solution Take 2.5 mg by nebulization every  4 (four) hours as needed for wheezing or shortness of breath.     Historical Provider, MD  aspirin 325 MG tablet Take 325 mg by mouth daily.    Historical Provider, MD  atorvastatin (LIPITOR) 80 MG tablet Take 80 mg by mouth daily.    Historical Provider, MD  budesonide-formoterol (SYMBICORT) 160-4.5 MCG/ACT inhaler Inhale 2 puffs into the lungs 2 (two) times daily.    Historical Provider, MD  citalopram (CELEXA) 20 MG tablet Take 20 mg by mouth daily.    Historical Provider, MD  clopidogrel (PLAVIX) 75 MG tablet Take 75 mg by mouth every morning.     Historical Provider, MD  doxycycline (VIBRA-TABS) 100 MG tablet Take 1 tablet (100 mg total) by mouth every 12 (twelve) hours. 06/16/15   Barton Dubois, MD  ezetimibe (ZETIA) 10 MG tablet Take 10 mg by mouth daily.    Historical Provider, MD  furosemide (LASIX) 40 MG tablet Take 40 mg by mouth 2 (two) times daily.    Historical Provider, MD  gabapentin (NEURONTIN) 300 MG capsule Take 300 mg by mouth 2 (two) times daily.     Historical Provider, MD  glipiZIDE (GLUCOTROL) 5 MG tablet Take 5 mg by mouth daily before supper.     Historical Provider, MD  guaiFENesin (MUCINEX) 600 MG 12 hr tablet Take 1 tablet (600 mg total) by mouth 2 (two) times daily. 06/16/15   Barton Dubois, MD  losartan (COZAAR) 50 MG tablet Take 1 tablet (50 mg total) by mouth daily with breakfast. 12/16/14   Donielle Liston Alba, PA-C  MAGNESIUM PO Take 1-2 tablets by mouth daily as needed (constipation).     Historical Provider, MD  metoprolol tartrate (LOPRESSOR) 25 MG tablet Take 1 tablet (25 mg total) by mouth 2 (two) times daily. 04/03/15   Burnell Blanks, MD  OXYGEN-HELIUM IN Inhale 3 L into the lungs daily.     Historical Provider, MD  pantoprazole (PROTONIX) 40 MG tablet Take 1 tablet (40 mg total) by mouth daily at 12 noon. 06/16/15   Barton Dubois, MD  potassium chloride SA (K-DUR,KLOR-CON) 20 MEQ tablet Take 40 mEq by mouth daily.    Historical Provider, MD  predniSONE  (DELTASONE) 20 MG tablet Take 3 tablets by mouth daily X 1 days, then 2 tablets by mouth daily X 3 days; then 1 tablet by mouth daily X 2 days, then 1/2 tablet by mouth daily X 2 days and stop prednisone 06/16/15   Barton Dubois, MD    Physical Exam:  Filed Vitals:   08/06/15 1818 08/06/15 2215 08/06/15 2245 08/06/15 2300  BP: 149/74 142/59 128/73 122/71  Pulse: 92 99 96 95  Temp: 98.4 F (36.9 C)     TempSrc: Oral     Resp: 24 27 17 21   SpO2: 95% 99% 96% 99%   General: Not in acute distress HEENT:       Eyes: PERRL, EOMI, no scleral icterus. Both eyes have some slightly purulent drainage.       ENT: no ear discharge, no pharynx injection, no tonsillar enlargement.        Neck: No JVD, no bruit, no mass felt. Heme: No neck lymph node enlargement. Cardiac: S1/S2, remote heart sounds, RRR, No murmurs, No gallops or rubs. Pulm: severely decreased air movement bilaterally. No rales, wheezing, rhonchi or rubs. Abd: Soft, nondistended, nontender, no rebound pain, no organomegaly, BS present. Ext: trace leg edema bilaterally. 2+DP/PT pulse bilaterally. Musculoskeletal: No joint deformities, No joint redness or warmth, no limitation of ROM in spin. Skin: No rashes.  Neuro: Alert, oriented X3, cranial nerves II-XII grossly intact, muscle strength 5/5 in all extremities, sensation to light touch intact.  Psych: Patient is not psychotic, no suicidal or hemocidal ideation.  Labs on Admission:  Basic Metabolic Panel:  Recent Labs Lab 08/06/15 1832  NA 138  K 4.0  CL 97*  CO2 29  GLUCOSE 248*  BUN 8  CREATININE 0.81  CALCIUM 9.0   Liver Function Tests: No results for input(s): AST, ALT, ALKPHOS, BILITOT, PROT, ALBUMIN in the last 168 hours. No results for input(s): LIPASE, AMYLASE in the last 168 hours. No results for input(s): AMMONIA in the last 168 hours. CBC:  Recent Labs Lab 08/06/15 1832  WBC 8.4  NEUTROABS 5.9  HGB 12.6  HCT 38.5  MCV 83.9  PLT 168   Cardiac  Enzymes: No results for input(s): CKTOTAL, CKMB, CKMBINDEX, TROPONINI in the last 168 hours.  BNP (last 3 results)  Recent Labs  08/06/15 1832  BNP 40.5    ProBNP (last 3 results) No results for input(s): PROBNP in the last 8760 hours.  CBG: No results for input(s): GLUCAP in the last 168 hours.  Radiological Exams on Admission: Dg Chest 2 View  08/06/2015   CLINICAL DATA:  Shortness of breath with productive cough  EXAM: CHEST  2 VIEW  COMPARISON:  June 14, 2015  FINDINGS: There is mild scarring in the left base. There is no edema or consolidation. Heart size and pulmonary vascularity are normal. No adenopathy. 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 No bone lesions. There is mild degenerative change in the thoracic spine.  IMPRESSION: Mild scarring left base. No edema or consolidation. Prosthetic aortic valve present.   Electronically Signed   By: Lowella Grip III M.D.   On: 08/06/2015 19:22    EKG: Independently reviewed.  Abnormal findings: Low voltage and Q-wave only in lead III.  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 No chest pain, less likely to have pulmonary embolism. Patient's BNP is 40.5b and has only trace leg  edeam, indicating less likely to have CHF exacerbation. Patient has sick contact with her son, indicating possible upper respiratory viral infection as triggering factors.  Plan:  -will admit patient to telemetry bed  -Nebulizers: scheduled Duoneb and prn  albuterol -Continue home Symbicort inhaler -Solu-Medrol 60 mg IV bid  -Oral azithromycin for 5 days.  -Mucinex for cough  -HIV Ab -Urine legionella and S. pneumococcal antigen -Follow up blood culture x2, sputum culture, Flu pcr  Chronic diastolic CHF: 2-D echo on 01/26/15 showed EF 60-65% with grade 1 diastolic dysfunction. CHF is compensated on admission. BNP 40.5. Has only trace amount of leg edema. -Continue aspirin, metoprolol, -Home dose Lasix 40 mg twice a day  CAD: s/p of stent placement. No CP.  -continue aspirin, Plavix, metoprolol  GERD: -Protonix  DM-II: Last A1c 7.0 on 06/15/15,  well controled. Patient is taking glipizide at home. Blood sugar is 248 on admission. -Start low-dose Lantus: 3 unit daily in hospital -SSI  HLD: Last LDL was 116 on 07/06/14. -Continue home medications: Lipitor and Zetia  Essential hypertension: -On Lasix -Cozaar and metoprolol  Eye itches and purulent discharge: -Ciprofloxacin eyedrops  Depression: Stable, no suicidal or homicidal ideations. -Continue home medications: Celexa   DVT ppx: SQ Heparin       Code Status: Full code Family Communication: None at bed side.    Disposition Plan: Admit to inpatient   Date of Service 08/06/2015    Ivor Costa [facility] Pager [phone]  If 7PM-7AM, please contact night-coverage [email or web address] Password [email or web address] 08/06/2015, 11:22 PM

## 2015-08-06 NOTE — ED Notes (Signed)
Admitting MD at the bedside.  

## 2015-08-06 NOTE — ED Notes (Signed)
ATtempted Report x1.

## 2015-08-06 NOTE — ED Notes (Signed)
Pt reports sob since this weekend, has had trouble catching her breath. Reports sore throat and both ears hurt. Has been coughing, with yellow sputum. Wears 3 L oxygen.

## 2015-08-06 NOTE — ED Provider Notes (Signed)
History   Chief Complaint  Patient presents with  . Shortness of Breath    HPI 54 year old female past history as below notable for CHF, COPD on 3 L chronically, morbid obesity, CAD status post stents in 2010, history of PE who presents to ED complaining of URI symptoms, shortness of breath, wheezing. Patient reports having itchy eyes with clear drainage. Left eye is red and has some slightly purulent drainage. Patient is reporting a mild sore throat as well and nasal congestion. Patient reports her son is also sick with cough, congestion at home. Patient reports she's had 3 or 4 days of gradually worsening short of breath with wheezing. She states she had pneumonia a couple of months ago as well which she improved from her she denies any chest pain, leg pain, leg swelling, fever, chills, urinary symptoms, abdominal pain, diaphoresis, nausea, vomiting, diarrhea. Patient states she is taking all her medications as instructed. She's been using her inhalers which have only helped mildly.  Onset of symptoms:  gradual.  Modifying factors- exertion. Severity: Moderate. Associated symptas above.  Past medical/surgical history, social history, medications, allergies and FH have been reviewed with patient and/or in documentation. Furthermore, if pt family or friend(s) present, additional historical information was obtained from them.  Past Medical History  Diagnosis Date  . Asthma   . Hypertension   . Hyperlipidemia   . GERD (gastroesophageal reflux disease)   . Depression   . Blood transfusion     2013[facility]  . Headache(784.0)     h/o migraines  . Pulmonary embolism 02/08/12    S/P RT TOTAL KNEE ON 02/03/12--ON 02/08/12--DEVELOPED ACUTE SOB AND CHEST PAIN--AND DIAGNOSED WITH  PULMONARY EMBOLUS AND PNEUMONIA  . Restless leg syndrome   . Myocardial infarction     PT THINKS SHE WAS DX WITH MI AT THE TIME OF HEART STENTING  . CAD (coronary artery disease)     Cath 2010 with DES x 1 RCA-- PT'S  CARDIOLOGIST IS DR. Goldenrod  . Uterine fibroid     NO PROBLEMS AT PRESENT FROM THE FIBROIDS-STATES SHE IS POST MENOPAUSAL-LAST MENSES 2010 EXCEPT FOR EPISODE THIS YR OF BLEEDING RELATED TO FIBROIDS.  Marland Kitchen Heart murmur   . Pneumonia 02/08/12    [facility]IZED AT [facility] WITH PNEUMONIA AND WITH PULMONARY EMBOLUS  . Diabetes mellitus DIAGNOSED IN2010    ORAL MEDS  . Arthritis     PAIN AND SEVERE OA LEFT KNEE ; S/P RIGHT TKA ON 02/03/12; HAS LOWER BACK PAIN-UNABLE TO STAND MORE THAN 10 MIN; ARTHRITIS "ALL OVER"  . Neck pain   . Weakness     BOTH HANDS - S/P BILATERAL CARPAL TUNNEL RELEASE--BUT STILL HAS WEAKNESS--OFTEN DROPS THINGS  . Shortness of breath     with exertion  . Eczema     on back  . Kidney stones   . Anemia   . COPD (chronic obstructive pulmonary disease)   . Sleep apnea     uses 3 liters O2 at night   . Aortic valve stenosis, severe   . Aortic stenosis   . Morbid obesity with body mass index of 60.0-69.9 in adult   . Chronic diastolic congestive heart failure    Past Surgical History  Procedure Laterality Date  . Tubal ligation    . Carpal tunnel release      Bilateral  . Cholecystectomy    . Hernia repair    . Cardiac catheterization    . Knee arthroplasty  02/03/2012    Procedure:  COMPUTER ASSISTED TOTAL KNEE ARTHROPLASTY;  Surgeon: Mcarthur Rossetti, MD;  Location: [place];  Service: Orthopedics;  Laterality: Right;  Right total knee arthroplasty  . Coronary angioplasty      2010 has stent in place  . Total knee arthroplasty  09/10/2012    Procedure: TOTAL KNEE ARTHROPLASTY;  Surgeon: Mcarthur Rossetti, MD;  Location: [facility];  Service: Orthopedics;  Laterality: Left;  . Trigger finger release  09/10/2012    Procedure: RELEASE TRIGGER FINGER/A-1 PULLEY;  Surgeon: Mcarthur Rossetti, MD;  Location: [facility];  Service: Orthopedics;  Laterality: Right;  Right Ring Finger  . Tee without cardioversion N/A 03/14/2013    Procedure: TRANSESOPHAGEAL ECHOCARDIOGRAM (TEE);   Surgeon: Lelon Perla, MD;  Location: [facility] ENDOSCOPY;  Service: Cardiovascular;  Laterality: N/A;  . Joint replacement      bil total knees  . Total knee revision Right 07/15/2013    Procedure: REVISION ARTHROPLASTY RIGHT KNEE;  Surgeon: Mcarthur Rossetti, MD;  Location: [facility];  Service: Orthopedics;  Laterality: Right;  . Cystoscopy w/ retrogrades Right 09/21/2013    Procedure: CYSTOSCOPY WITH RIGHT RETROGRADE PYELOGRAM RIGHT DOUBLE J STENT ;  Surgeon: Fredricka Bonine, MD;  Location: [facility];  Service: Urology;  Laterality: Right;  . Cystoscopy with ureteroscopy and stent placement Right 10/25/2013    Procedure: CYSTOSCOPY RIGHT URETEROSCOPY HOLMIUM LASER LITHO AND STENT PLACEMENT;  Surgeon: Fredricka Bonine, MD;  Location: [facility];  Service: Urology;  Laterality: Right;  . Tee without cardioversion N/A 11/14/2014    Procedure: TRANSESOPHAGEAL ECHOCARDIOGRAM (TEE);  Surgeon: Lelon Perla, MD;  Location: [facility] ENDOSCOPY;  Service: Cardiovascular;  Laterality: N/A;  . Left and right heart catheterization with coronary angiogram N/A 03/17/2013    Procedure: LEFT AND RIGHT HEART CATHETERIZATION WITH CORONARY ANGIOGRAM;  Surgeon: Burnell Blanks, MD;  Location: [facility] CATH LAB;  Service: Cardiovascular;  Laterality: N/A;  . Left and right heart catheterization with coronary/graft angiogram N/A 09/14/2014    Procedure: LEFT AND RIGHT HEART CATHETERIZATION WITH Beatrix Fetters;  Surgeon: Burnell Blanks, MD;  Location: [facility] CATH LAB;  Service: Cardiovascular;  Laterality: N/A;  . Transcatheter aortic valve replacement, transfemoral N/A 12/12/2014    Procedure: TRANSCATHETER AORTIC VALVE REPLACEMENT, TRANSFEMORAL;  Surgeon: Burnell Blanks, MD;  Location: [place];  Service: Cardiovascular;  Laterality: N/A;  . Intraoperative transesophageal echocardiogram N/A 12/12/2014    Procedure: INTRAOPERATIVE TRANSESOPHAGEAL ECHOCARDIOGRAM;  Surgeon: Burnell Blanks,  MD;  Location: [facility] OR;  Service: Cardiovascular;  Laterality: N/A;   Family History  Problem Relation Age of Onset  . Breast cancer Mother   . COPD Father     smoked  . Cancer Brother     Sinus  . Emphysema Mother     smoked  . Asthma Father   . Heart disease Mother   . Heart disease Father    History  Substance Use Topics  . Smoking status: Former Smoker -- 1.50 packs/day for 30 years    Types: Cigarettes    Quit date: 12/29/2000  . Smokeless tobacco: Never Used  . Alcohol Use: Yes     Comment: rarely     Review of Systems Constitutional: - F/C, +fatigue.  HENT: - congestion, -rhinorrhea, -sore throat.   Eyes: - eye pain, -visual disturbance.  Respiratory: - cough, -SOB, -hemoptysis.   Cardiovascular: - CP, -palps.  Gastrointestinal: - N/V/D, -abd pain  Genitourinary: - flank pain, -dysuria, -frequency.  Musculoskeletal: - myalgia/arthritis, -joint swelling, -gait abnormality, -back pain, -  neck pain/stiffness, -leg pain/swelling.  Skin: - rash/lesion.  Neurological: - focal weakness, -lightheadedness, -dizziness, -numbness, -HA.  All other systems reviewed and are negative.   Physical Exam  Physical Exam  ED Triage Vitals  Enc Vitals Group     BP 08/06/15 1818 149/74 mmHg     Pulse Rate 08/06/15 1818 92     Resp 08/06/15 1818 24     Temp 08/06/15 1818 98.4 F (36.9 C)     Temp Source 08/06/15 1818 Oral     SpO2 08/06/15 1818 95 %     Weight --      Height --      Head Cir --      Peak Flow --      Pain Score 08/06/15 1821 8     Pain Loc --      Pain Edu? --      Excl. in [place]? --    Constitutional: Chronically ill appearing morbidly obese 54 year old female who appears much older than stated age. Patient is in mild respiratory distress on arrival.  Head: Normocephalic and atraumatic.  Eyes: Extraocular motion intact, no scleral icterus. Purulent drainage from left thigh which is injected. Right eye is mildly injected with clear drainage. Ears: Bilateral  ears have small serous effusions noted and no signs of otitis externa Mouth: MMM, OP clear Neck: Supple without meningismus, mass, or overt JVD Respiratory: mild respiratory distress with diffuse expiratory wheezes and no rales.  CV: RRR, no obvious murmurs.  Pulses +2 and symmetric. Euvolemic Abdomen: Soft, NT, ND, no r/g. No mass.  MSK: Extremities are atraumatic without deformity, ROM intact. No bilateral lower extremity edema or pain with palpation  Skin: Warm, dry, intact without rash Neuro: AAOx4, MAE 5/5 sym, no focal deficit noted   ED Course  Procedures   Labs Reviewed  BASIC METABOLIC PANEL - Abnormal; Notable for the following:    Chloride 97 (*)    Glucose, Bld 248 (*)    All other components within normal limits  CBC WITH DIFFERENTIAL/PLATELET  BRAIN NATRIURETIC PEPTIDE  I-STAT TROPOININ, ED   I personally reviewed and interpreted all labs.  Dg Chest 2 View  08/06/2015   CLINICAL DATA:  Shortness of breath with productive cough  EXAM: CHEST  2 VIEW  COMPARISON:  June 14, 2015  FINDINGS: There is mild scarring in the left base. There is no edema or consolidation. Heart size and pulmonary vascularity are normal. No adenopathy. 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 No bone lesions. There is mild degenerative change in the thoracic spine.  IMPRESSION: Mild scarring left base. No edema or consolidation. Prosthetic aortic valve present.   Electronically Signed   By: Lowella Grip III M.D.   On: 08/06/2015 19:22   I personally viewed above image(s) which were used in my medical decision making. Formal interpretations by Radiology.  MDM: JULIZZA SASSONE is a 54 y.o. female with H&P as above who p/w CC: URI symptoms, wheezing  On arrival, patient is hemodynamically stable and in mild respiratory distress with diffuse wheezing. No signs of volume overload. Patient is nontoxic and well-hydrated. Patient appears to have viral illness causing COPD exac. Pt given breathing  treatment and screening labs and chest x-ray, EKG ordered. Solumedrol given.  EKG shows normal rate, NSR, normal axis, NSTWA, no significant abnormal changes from prior. Screening labs are unremarkable. Patient is without chest pain and initial troponin is negative. BNP is negative Chest x-ray is unremarkable.  Following neb pt  still with inc WOB and faint wheezes, although improved. Continuous albuterol given.  When attempting to ambulate patient patient's work of breathing worsened significantly and she is unable to ambulate as she does at baseline secondary to this. Patient will require admission for COPD exacerbation given this decline in functional status secondary to her COPD exacerbation.  Old records reviewed (if available). Labs and imaging reviewed personally by myself and considered in medical decision making if ordered.  Clinical Impression: 1. COPD exacerbation     Disposition: Admit  Condition: stable  I have discussed the results, Dx and Tx plan with the pt(& family if present). He/she/they expressed understanding and agree(s) with the plan.  Pt seen in conjunction with Dr. Orlie Dakin, MD  Kirstie Peri, [place] [facility] Resident - PGY-3     Kirstie Peri, MD 08/07/15 9767  Orlie Dakin, MD 08/07/15 3419

## 2015-08-06 NOTE — ED Notes (Signed)
RT to complete hour long.

## 2015-08-06 NOTE — ED Provider Notes (Signed)
Complains of shortness of breath progressively worsening since August 50,016. Feels like COPD she's had in the past. She admits to cough productive of yellow sputum denies fever. On exam mild respiratory distress. Lungs with prolonged story phase abdomen morbidly obese, nontender. Extremities without edema Chest x-ray viewed by me.  Patient felt to have exacerbation of COPD.  Results for orders placed or performed during the hospital encounter of 08/06/15  CBC with Differential  Result Value Ref Range   WBC 8.4 4.0 - 10.5 K/uL   RBC 4.59 3.87 - 5.11 MIL/uL   Hemoglobin 12.6 12.0 - 15.0 g/dL   HCT 38.5 36.0 - 46.0 %   MCV 83.9 78.0 - 100.0 fL   MCH 27.5 26.0 - 34.0 pg   MCHC 32.7 30.0 - 36.0 g/dL   RDW 14.7 11.5 - 15.5 %   Platelets 168 150 - 400 K/uL   Neutrophils Relative % 70 43 - 77 %   Neutro Abs 5.9 1.7 - 7.7 K/uL   Lymphocytes Relative 19 12 - 46 %   Lymphs Abs 1.6 0.7 - 4.0 K/uL   Monocytes Relative 8 3 - 12 %   Monocytes Absolute 0.7 0.1 - 1.0 K/uL   Eosinophils Relative 3 0 - 5 %   Eosinophils Absolute 0.2 0.0 - 0.7 K/uL   Basophils Relative 0 0 - 1 %   Basophils Absolute 0.0 0.0 - 0.1 K/uL  Basic metabolic panel  Result Value Ref Range   Sodium 138 135 - 145 mmol/L   Potassium 4.0 3.5 - 5.1 mmol/L   Chloride 97 (L) 101 - 111 mmol/L   CO2 29 22 - 32 mmol/L   Glucose, Bld 248 (H) 65 - 99 mg/dL   BUN 8 6 - 20 mg/dL   Creatinine, Ser 0.81 0.44 - 1.00 mg/dL   Calcium 9.0 8.9 - 10.3 mg/dL   GFR calc non Af Amer >60 >60 mL/min   GFR calc Af Amer >60 >60 mL/min   Anion gap 12 5 - 15  Brain natriuretic peptide  Result Value Ref Range   B Natriuretic Peptide 40.5 0.0 - 100.0 pg/mL  I-Stat Troponin, ED (not at [facility])  Result Value Ref Range   Troponin i, poc 0.04 0.00 - 0.08 ng/mL   Comment 3           Dg Chest 2 View  08/06/2015   CLINICAL DATA:  Shortness of breath with productive cough  EXAM: CHEST  2 VIEW  COMPARISON:  June 14, 2015  FINDINGS: There is mild scarring in  the left base. There is no edema or consolidation. Heart size and pulmonary vascularity are normal. No adenopathy. 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 No bone lesions. There is mild degenerative change in the thoracic spine.  IMPRESSION: Mild scarring left base. No edema or consolidation. Prosthetic aortic valve present.   Electronically Signed   By: Lowella Grip III M.D.   On: 08/06/2015 19:22   ED ECG REPORT   Date: 08/07/2015  Rate: 90  Rhythm: normal sinus rhythm  QRS Axis: normal  Intervals: normal  ST/T Wave abnormalities: nonspecific T wave changes  Conduction Disutrbances:none  Narrative Interpretation:   Old EKG Reviewed: No significant change from 12/13/2014  I have personally reviewed the EKG tracing and agree with the computerized printout as noted.  Orlie Dakin, MD 08/07/15 [phone]

## 2015-08-06 NOTE — ED Notes (Signed)
MD Balleh at the bedside.

## 2015-08-07 ENCOUNTER — Encounter (HOSPITAL_COMMUNITY): Payer: Self-pay

## 2015-08-07 DIAGNOSIS — I2510 Atherosclerotic heart disease of native coronary artery without angina pectoris: Secondary | ICD-10-CM

## 2015-08-07 DIAGNOSIS — E119 Type 2 diabetes mellitus without complications: Secondary | ICD-10-CM

## 2015-08-07 DIAGNOSIS — J441 Chronic obstructive pulmonary disease with (acute) exacerbation: Principal | ICD-10-CM

## 2015-08-07 LAB — EXPECTORATED SPUTUM ASSESSMENT W GRAM STAIN, RFLX TO RESP C

## 2015-08-07 LAB — BASIC METABOLIC PANEL
ANION GAP: 10 (ref 5–15)
BUN: 10 mg/dL (ref 6–20)
CO2: 30 mmol/L (ref 22–32)
Calcium: 9.2 mg/dL (ref 8.9–10.3)
Chloride: 97 mmol/L — ABNORMAL LOW (ref 101–111)
Creatinine, Ser: 1.01 mg/dL — ABNORMAL HIGH (ref 0.44–1.00)
GFR calc Af Amer: >60 mL/min (ref >60)
GFR calc non Af Amer: >60 mL/min (ref >60)
Glucose, Bld: 273 mg/dL — ABNORMAL HIGH (ref 65–99)
Potassium: 3.2 mmol/L — ABNORMAL LOW (ref 3.5–5.1)
Sodium: 137 mmol/L (ref 135–145)

## 2015-08-07 LAB — CBC
HEMATOCRIT: 37.5 % (ref 36.0–46.0)
HEMOGLOBIN: 11.9 g/dL — AB (ref 12.0–15.0)
MCH: 26.8 pg (ref 26.0–34.0)
MCHC: 31.7 g/dL (ref 30.0–36.0)
MCV: 84.5 fL (ref 78.0–100.0)
Platelets: 176 10*3/uL (ref 150–400)
RBC: 4.44 MIL/uL (ref 3.87–5.11)
RDW: 14.7 % (ref 11.5–15.5)
WBC: 10 10*3/uL (ref 4.0–10.5)

## 2015-08-07 LAB — GLUCOSE, CAPILLARY
GLUCOSE-CAPILLARY: 234 mg/dL — AB (ref 65–99)
GLUCOSE-CAPILLARY: 229 mg/dL — AB (ref 65–99)
Glucose-Capillary: 244 mg/dL — ABNORMAL HIGH (ref 65–99)
Glucose-Capillary: 304 mg/dL — ABNORMAL HIGH (ref 65–99)
Glucose-Capillary: 243 mg/dL — ABNORMAL HIGH (ref 65–99)

## 2015-08-07 LAB — INFLUENZA PANEL BY PCR (TYPE A & B)
H1N1 flu by pcr: NOT DETECTED
INFLAPCR: NEGATIVE
Influenza B By PCR: NEGATIVE

## 2015-08-07 LAB — APTT: APTT: 28 s (ref 24–37)

## 2015-08-07 LAB — PROTIME-INR
INR: 1.02 (ref 0.00–1.49)
Prothrombin Time: 13.6 seconds (ref 11.6–15.2)

## 2015-08-07 LAB — STREP PNEUMONIAE URINARY ANTIGEN: Strep Pneumo Urinary Antigen: NEGATIVE

## 2015-08-07 LAB — HIV ANTIBODY (ROUTINE TESTING W REFLEX): HIV SCREEN 4TH GENERATION: NONREACTIVE

## 2015-08-07 MED ORDER — IPRATROPIUM-ALBUTEROL 0.5-2.5 (3) MG/3ML IN SOLN
3.0000 mL | RESPIRATORY_TRACT | Status: DC
  Administered 2015-08-07 – 2015-08-08 (×6): 3 mL via RESPIRATORY_TRACT
  Filled 2015-08-07 (×7): qty 3

## 2015-08-07 MED ORDER — METOPROLOL TARTRATE 25 MG PO TABS
25.0000 mg | ORAL_TABLET | Freq: Two times a day (BID) | ORAL | Status: DC
  Administered 2015-08-07 – 2015-08-08 (×4): 25 mg via ORAL
  Filled 2015-08-07 (×4): qty 1

## 2015-08-07 MED ORDER — SODIUM CHLORIDE 0.9 % IJ SOLN
3.0000 mL | Freq: Two times a day (BID) | INTRAMUSCULAR | Status: DC
  Administered 2015-08-07 – 2015-08-08 (×2): 3 mL via INTRAVENOUS

## 2015-08-07 MED ORDER — POTASSIUM CHLORIDE CRYS ER 20 MEQ PO TBCR
40.0000 meq | EXTENDED_RELEASE_TABLET | Freq: Once | ORAL | Status: AC
  Administered 2015-08-07: 40 meq via ORAL
  Filled 2015-08-07: qty 2

## 2015-08-07 MED ORDER — ONDANSETRON HCL 4 MG PO TABS: 4.0000 mg | ORAL_TABLET | Freq: Four times a day (QID) | ORAL | Status: DC | PRN

## 2015-08-07 MED ORDER — ARFORMOTEROL TARTRATE 15 MCG/2ML IN NEBU
15.0000 ug | INHALATION_SOLUTION | Freq: Two times a day (BID) | RESPIRATORY_TRACT | Status: DC
  Administered 2015-08-07 – 2015-08-08 (×2): 15 ug via RESPIRATORY_TRACT
  Filled 2015-08-07 (×4): qty 2

## 2015-08-07 MED ORDER — ATORVASTATIN CALCIUM 80 MG PO TABS
80.0000 mg | ORAL_TABLET | Freq: Every day | ORAL | Status: DC
  Administered 2015-08-07 – 2015-08-08 (×2): 80 mg via ORAL
  Filled 2015-08-07 (×2): qty 1

## 2015-08-07 MED ORDER — ALUM & MAG HYDROXIDE-SIMETH 200-200-20 MG/5ML PO SUSP
30.0000 mL | Freq: Four times a day (QID) | ORAL | Status: DC | PRN
Start: 2015-08-07 — End: 2015-08-08

## 2015-08-07 MED ORDER — INSULIN GLARGINE 100 UNIT/ML ~~LOC~~ SOLN
10.0000 [IU] | Freq: Every day | SUBCUTANEOUS | Status: DC
  Administered 2015-08-08: 10 [IU] via SUBCUTANEOUS
  Filled 2015-08-07 (×3): qty 0.1

## 2015-08-07 MED ORDER — CLOPIDOGREL BISULFATE 75 MG PO TABS
75.0000 mg | ORAL_TABLET | Freq: Every morning | ORAL | Status: DC
  Administered 2015-08-07 – 2015-08-08 (×2): 75 mg via ORAL
  Filled 2015-08-07 (×3): qty 1

## 2015-08-07 MED ORDER — FUROSEMIDE 40 MG PO TABS
60.0000 mg | ORAL_TABLET | Freq: Two times a day (BID) | ORAL | Status: DC
  Administered 2015-08-07 – 2015-08-08 (×2): 60 mg via ORAL
  Filled 2015-08-07 (×4): qty 1

## 2015-08-07 MED ORDER — POTASSIUM CHLORIDE CRYS ER 20 MEQ PO TBCR: 40.0000 meq | EXTENDED_RELEASE_TABLET | Freq: Every day | ORAL | Status: DC

## 2015-08-07 MED ORDER — MAGNESIUM OXIDE 400 (241.3 MG) MG PO TABS: 200.0000 mg | ORAL_TABLET | Freq: Every day | ORAL | Status: DC | PRN

## 2015-08-07 MED ORDER — GABAPENTIN 300 MG PO CAPS
900.0000 mg | ORAL_CAPSULE | Freq: Every day | ORAL | Status: DC
  Administered 2015-08-07 (×2): 900 mg via ORAL
  Filled 2015-08-07 (×2): qty 3

## 2015-08-07 MED ORDER — BUDESONIDE 0.5 MG/2ML IN SUSP
0.5000 mg | Freq: Two times a day (BID) | RESPIRATORY_TRACT | Status: DC
  Administered 2015-08-07 – 2015-08-08 (×2): 0.5 mg via RESPIRATORY_TRACT
  Filled 2015-08-07 (×2): qty 2

## 2015-08-07 MED ORDER — ARFORMOTEROL TARTRATE 15 MCG/2ML IN NEBU
15.0000 ug | INHALATION_SOLUTION | Freq: Two times a day (BID) | RESPIRATORY_TRACT | Status: DC
  Filled 2015-08-07 (×2): qty 2

## 2015-08-07 MED ORDER — ONDANSETRON HCL 4 MG/2ML IJ SOLN: 4.0000 mg | Freq: Four times a day (QID) | INTRAMUSCULAR | Status: DC | PRN

## 2015-08-07 MED ORDER — INSULIN ASPART 100 UNIT/ML ~~LOC~~ SOLN
0.0000 [IU] | Freq: Three times a day (TID) | SUBCUTANEOUS | Status: DC
  Administered 2015-08-07 (×2): 3 [IU] via SUBCUTANEOUS

## 2015-08-07 MED ORDER — GUAIFENESIN-CODEINE 100-10 MG/5ML PO SOLN: 5.0000 mL | ORAL | Status: DC | PRN

## 2015-08-07 MED ORDER — AZITHROMYCIN 500 MG PO TABS
250.0000 mg | ORAL_TABLET | Freq: Every day | ORAL | Status: DC
  Administered 2015-08-08: 250 mg via ORAL
  Filled 2015-08-07: qty 1

## 2015-08-07 MED ORDER — INSULIN ASPART 100 UNIT/ML ~~LOC~~ SOLN
0.0000 [IU] | Freq: Three times a day (TID) | SUBCUTANEOUS | Status: DC
  Administered 2015-08-07: 11 [IU] via SUBCUTANEOUS
  Administered 2015-08-08 (×2): 5 [IU] via SUBCUTANEOUS

## 2015-08-07 MED ORDER — CIPROFLOXACIN HCL 0.3 % OP SOLN
2.0000 [drp] | OPHTHALMIC | Status: DC
  Administered 2015-08-07 – 2015-08-08 (×6): 2 [drp] via OPHTHALMIC
  Filled 2015-08-07: qty 2.5

## 2015-08-07 MED ORDER — AZITHROMYCIN 500 MG PO TABS
500.0000 mg | ORAL_TABLET | Freq: Every day | ORAL | Status: AC
  Administered 2015-08-07: 500 mg via ORAL
  Filled 2015-08-07: qty 1

## 2015-08-07 MED ORDER — VITAMIN B-12 100 MCG PO TABS
100.0000 ug | ORAL_TABLET | Freq: Every day | ORAL | Status: DC
  Administered 2015-08-07 – 2015-08-08 (×2): 100 ug via ORAL
  Filled 2015-08-07 (×2): qty 1

## 2015-08-07 MED ORDER — ALBUTEROL SULFATE (2.5 MG/3ML) 0.083% IN NEBU: 5.0000 mg | INHALATION_SOLUTION | RESPIRATORY_TRACT | Status: DC | PRN

## 2015-08-07 MED ORDER — DM-GUAIFENESIN ER 30-600 MG PO TB12
1.0000 | ORAL_TABLET | Freq: Two times a day (BID) | ORAL | Status: DC
  Administered 2015-08-07 (×2): 1 via ORAL
  Filled 2015-08-07 (×2): qty 1

## 2015-08-07 MED ORDER — ACETAMINOPHEN 325 MG PO TABS
650.0000 mg | ORAL_TABLET | Freq: Four times a day (QID) | ORAL | Status: DC | PRN
  Administered 2015-08-08: 650 mg via ORAL
  Filled 2015-08-07: qty 2

## 2015-08-07 MED ORDER — METHYLPREDNISOLONE SODIUM SUCC 125 MG IJ SOLR
60.0000 mg | Freq: Two times a day (BID) | INTRAMUSCULAR | Status: DC
  Administered 2015-08-07 – 2015-08-08 (×3): 60 mg via INTRAVENOUS
  Filled 2015-08-07 (×3): qty 2

## 2015-08-07 MED ORDER — BUDESONIDE-FORMOTEROL FUMARATE 160-4.5 MCG/ACT IN AERO
2.0000 | INHALATION_SPRAY | Freq: Two times a day (BID) | RESPIRATORY_TRACT | Status: DC
  Administered 2015-08-07: 2 via RESPIRATORY_TRACT
  Filled 2015-08-07: qty 6

## 2015-08-07 MED ORDER — INSULIN ASPART 100 UNIT/ML ~~LOC~~ SOLN
0.0000 [IU] | Freq: Every day | SUBCUTANEOUS | Status: DC
  Administered 2015-08-08: 2 [IU] via SUBCUTANEOUS

## 2015-08-07 MED ORDER — LATANOPROST 0.005 % OP SOLN
1.0000 [drp] | Freq: Every day | OPHTHALMIC | Status: DC
  Administered 2015-08-07: 1 [drp] via OPHTHALMIC
  Filled 2015-08-07: qty 2.5

## 2015-08-07 MED ORDER — FUROSEMIDE 40 MG PO TABS
40.0000 mg | ORAL_TABLET | Freq: Two times a day (BID) | ORAL | Status: DC
  Administered 2015-08-07: 40 mg via ORAL
  Filled 2015-08-07: qty 1

## 2015-08-07 MED ORDER — INSULIN GLARGINE 100 UNIT/ML ~~LOC~~ SOLN
3.0000 [IU] | Freq: Every day | SUBCUTANEOUS | Status: DC
  Administered 2015-08-07: 3 [IU] via SUBCUTANEOUS
  Filled 2015-08-07 (×2): qty 0.03

## 2015-08-07 MED ORDER — EZETIMIBE 10 MG PO TABS
10.0000 mg | ORAL_TABLET | Freq: Every day | ORAL | Status: DC
  Administered 2015-08-07 – 2015-08-08 (×2): 10 mg via ORAL
  Filled 2015-08-07 (×2): qty 1

## 2015-08-07 MED ORDER — CITALOPRAM HYDROBROMIDE 20 MG PO TABS
20.0000 mg | ORAL_TABLET | Freq: Every day | ORAL | Status: DC
  Administered 2015-08-07 – 2015-08-08 (×2): 20 mg via ORAL
  Filled 2015-08-07 (×2): qty 1

## 2015-08-07 MED ORDER — LOSARTAN POTASSIUM 50 MG PO TABS
50.0000 mg | ORAL_TABLET | Freq: Every day | ORAL | Status: DC
  Administered 2015-08-07 – 2015-08-08 (×2): 50 mg via ORAL
  Filled 2015-08-07 (×2): qty 1

## 2015-08-07 MED ORDER — BUDESONIDE 0.5 MG/2ML IN SUSP: 0.5000 mg | Freq: Two times a day (BID) | RESPIRATORY_TRACT | Status: DC

## 2015-08-07 MED ORDER — HEPARIN SODIUM (PORCINE) 5000 UNIT/ML IJ SOLN
5000.0000 [IU] | Freq: Three times a day (TID) | INTRAMUSCULAR | Status: DC
  Administered 2015-08-07 (×2): 5000 [IU] via SUBCUTANEOUS
  Filled 2015-08-07 (×2): qty 1

## 2015-08-07 MED ORDER — ASPIRIN 325 MG PO TABS
325.0000 mg | ORAL_TABLET | Freq: Every day | ORAL | Status: DC
  Administered 2015-08-07 – 2015-08-08 (×2): 325 mg via ORAL
  Filled 2015-08-07 (×2): qty 1

## 2015-08-07 MED ORDER — ACETAMINOPHEN 650 MG RE SUPP: 650.0000 mg | Freq: Four times a day (QID) | RECTAL | Status: DC | PRN

## 2015-08-07 MED ORDER — ENOXAPARIN SODIUM 80 MG/0.8ML ~~LOC~~ SOLN
80.0000 mg | SUBCUTANEOUS | Status: DC
  Administered 2015-08-07: 80 mg via SUBCUTANEOUS
  Filled 2015-08-07: qty 0.8

## 2015-08-07 NOTE — Progress Notes (Signed)
[facility] PROGRESS NOTE  NICLE CONNOLE TDS:[id number] DOB: 09-11-1961 DOA: 08/06/2015 PCP: Maggie Font, MD  Brief Summary   The patient is a 54 year old female with history of diabetes mellitus type 2, hypertension, hyperlipidemia, COPD on 3 L home oxygen, GERD, depression, history of pulmonary embolism, CAD status post MI and cardiac stent, severe AS status post prosthetic aortic valve replacement, chronic diastolic heart failure with preserved ejection fraction, morbid obesity, who presented with cough and shortness of breath. She reported having worsening shortness of breath over the 3 days prior to admission with cough productive of yellow sputum, runny nose, sore throat, and chills. A family member had a URI. In the emergency department, she was found to have normal white blood cell count, normal temperature, normal BNP and troponin, however she had some respiratory distress and was admitted for further treatment. She was started on IV Solu-Medrol and duo nebs.  Assessment/Plan  COPD exacerbation, improving -  Telemetry demonstrates normal sinus rhythm interspersed with sinus tachycardia, okay to discontinue telemetry  - Continue Duoneb and prn albuterol - Discontinue Symbicort inhaler and start Burow Vann and budesonide -Continue Solu-Medrol 60 mg IV bid  -Continue Oral azithromycin for 5 days.  -Discontinue Mucinex for cough  - Start guaifenesin with codeine when necessary -HIV Ab -Urine legionella negative and S. pneumococcal antigen pending - Flu pcr neg and okay to d/c droplet precautions - Sputum culture if able  Possible acute on chronic diastolic CHF: 2-D echo on 01/26/15 showed EF 60-65% with grade 1 diastolic dysfunction. CHF is compensated on admission. BNP 40.5. Has leg edema -Continue aspirin, metoprolol, -Increase Lasix to 60 mg by mouth twice a day  -  Daily weights and strict ins and outs  -  Repeat BMP in AM  CAD: s/p of stent placement. No CP.   -continue aspirin, Plavix, metoprolol, and high dose statin  GERD, stable, continue protonix  DM-II: Last A1c 7.0 on 06/15/15, well controled. Patient is taking glipizide at home. Blood sugar is 248 on admission. -increase to Lantus 10 units - increase to mod dose SSI and add HS insulin  HLD: Last LDL was 116 on 07/06/14. -Continue home medications: Lipitor and Zetia  Essential hypertension: -On Lasix -Cozaar and metoprolol  Eye itches and purulent discharge: -Ciprofloxacin eyedrops  Depression: Stable, no suicidal or homicidal ideations. -Continue home medications: Celexa   Diet:  Healthy heart, diabetic Access:  PIV IVF:  off Proph:  lovenox  Code Status: full Family Communication: patient alone Disposition Plan: pending improvement in breathing. Appreciate physical therapy assessment. OT recommending a tub/shower seat.   Consultants:  none  Procedures:  CXR  Antibiotics:  azithromycin   HPI/Subjective:  States that her breathing feels somewhat better however she still has severe wheezy cough it takes her breath away. She is Ayham Word of breath at rest and has difficulty walking back and forth to the bathroom. She denies chest pains, fevers, chills, nausea, vomiting, diarrhea. She still has a scratchy throat.    Objective: Filed Vitals:   08/07/15 0500 08/07/15 0643 08/07/15 0909 08/07/15 1103  BP:  128/65    Pulse:  89    Temp:  98.4 F (36.9 C)    TempSrc:      Resp:  18    Height: 5\' 3"  (1.6 m)     Weight: 156 kg (343 lb 14.7 oz)     SpO2:  92% 93% 90%    Intake/Output Summary (Last 24 hours) at 08/07/15 1331 Last data filed  at 08/07/15 0748  Gross per 24 hour  Intake      0 ml  Output    250 ml  Net   -250 ml   Filed Weights   08/07/15 0500  Weight: 156 kg (343 lb 14.7 oz)   Body mass index is 60.94 kg/(m^2).  Exam:   General:  Obese adult female, frequent wheezy cough which causes her face to turn red and she had a difficult time  catching her breath afterwards, audible wheeze without stethoscope  HEENT:  NCAT, MMM  Cardiovascular:  RRR, nl S1, S2 no mrg, 2+ pulses, warm extremities  Respiratory:  Diminished bilateral breath sounds with high-pitched expiratory wheeze and prolonged expiratory phase, rales at the bilateral bases, no rhonchi  Abdomen:   NABS, soft, NT/ND  MSK:   Normal tone and bulk, 1+ pitting bilateral LEE  Neuro:  Grossly intact  Data Reviewed: Basic Metabolic Panel:  Recent Labs Lab 08/06/15 1832 08/07/15 0231  NA 138 137  K 4.0 3.2*  CL 97* 97*  CO2 29 30  GLUCOSE 248* 273*  BUN 8 10  CREATININE 0.81 1.01*  CALCIUM 9.0 9.2   Liver Function Tests: No results for input(s): AST, ALT, ALKPHOS, BILITOT, PROT, ALBUMIN in the last 168 hours. No results for input(s): LIPASE, AMYLASE in the last 168 hours. No results for input(s): AMMONIA in the last 168 hours. CBC:  Recent Labs Lab 08/06/15 1832 08/07/15 0231  WBC 8.4 10.0  NEUTROABS 5.9  --   HGB 12.6 11.9*  HCT 38.5 37.5  MCV 83.9 84.5  PLT 168 176    Recent Results (from the past 240 hour(s))  Culture, sputum-assessment     Status: None   Collection Time: 08/07/15  2:50 AM  Result Value Ref Range Status   Specimen Description SPUTUM  Final   Special Requests NONE  Final   Sputum evaluation   Final    THIS SPECIMEN IS ACCEPTABLE. RESPIRATORY CULTURE REPORT TO FOLLOW.   Report Status 08/07/2015 FINAL  Final     Studies: Dg Chest 2 View  08/06/2015   CLINICAL DATA:  Shortness of breath with productive cough  EXAM: CHEST  2 VIEW  COMPARISON:  June 14, 2015  FINDINGS: There is mild scarring in the left base. There is no edema or consolidation. Heart size and pulmonary vascularity are normal. No adenopathy. 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 No bone lesions. There is mild degenerative change in the thoracic spine.  IMPRESSION: Mild scarring left base. No edema or consolidation. Prosthetic aortic valve present.    Electronically Signed   By: Lowella Grip III M.D.   On: 08/06/2015 19:22    Scheduled Meds: . aspirin  325 mg Oral Daily  . atorvastatin  80 mg Oral Daily  . [START ON 08/08/2015] azithromycin  250 mg Oral Daily  . budesonide-formoterol  2 puff Inhalation BID  . ciprofloxacin  2 drop Both Eyes Q4H while awake  . citalopram  20 mg Oral Daily  . clopidogrel  75 mg Oral q morning - 10a  . ezetimibe  10 mg Oral Daily  . furosemide  60 mg Oral BID  . gabapentin  900 mg Oral QHS  . heparin  5,000 Units Subcutaneous 3 times per day  . insulin aspart  0-9 Units Subcutaneous TID WC  . insulin glargine  3 Units Subcutaneous QHS  . ipratropium-albuterol  3 mL Nebulization Q4H  . latanoprost  1 drop Both Eyes QHS  . losartan  50 mg Oral Q breakfast  . methylPREDNISolone (SOLU-MEDROL) injection  60 mg Intravenous Q12H  . metoprolol tartrate  25 mg Oral BID  . [START ON 08/08/2015] potassium chloride  40 mEq Oral Daily  . sodium chloride  3 mL Intravenous Q12H  . 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 If 7PM-7AM, please contact night-coverage at [email or web address], password [facility] 08/07/2015, 1:31 PM  LOS: 1 day

## 2015-08-07 NOTE — Progress Notes (Signed)
Inpatient Diabetes Program Recommendations  AACE/ADA: New Consensus Statement on Inpatient Glycemic Control (2013)  Target Ranges:  Prepandial:   less than 140 mg/dL      Peak postprandial:   less than 180 mg/dL (1-2 hours)      Critically ill patients:  140 - 180 mg/dL   Pt on solumedrol with hyperlgycemia. Please consider the following: Inpatient Diabetes Program Recommendations Insulin - Basal: Please consider increase in basal lantus to 10 units or more while on solumedrol May need increase correction to moderate and add HS scale. Added  Carbohydrate modified to heart healthy diet orders with co-sign required.  Thank you Rosita Kea, RN, MSN, CDE  Diabetes Inpatient Program Office: [phone] Pager: [phone] 8:00 am to 5:00 pm

## 2015-08-07 NOTE — Evaluation (Signed)
Occupational Therapy Evaluation Patient Details Name: [name] MRN: [id number] DOB: 05/15/1961 Today's Date: 08/07/2015    History of Present Illness 54 y.o. female with h/o R TKA with revision 2014, DM, COPD on 3 L O2 at home, HTN, PE, RLS, CAD, MI, CHF admitted with SOB, cough. Dx of COPD exacerbation.    Clinical Impression   Pt admitted with above. Pt getting assist with toilet hygiene/washing bottom, PTA. Feel pt will benefit from acute OT to increase independence and activity tolerance as well as reinforce energy conservation techniques prior to d/c.     Follow Up Recommendations  No OT follow up;Supervision - Intermittent    Equipment Recommendations  Tub/shower seat    Recommendations for Other Services       Precautions / Restrictions Precautions Precautions: Fall Precaution Comments: monitor O2, h/o falls Restrictions Weight Bearing Restrictions: No      Mobility Bed Mobility Overal bed mobility: Modified Independent-sit to supine              Transfers Overall transfer level: Modified independent                    Balance Overall balance assessment: History of Falls                                          ADL Overall ADL's : Needs assistance/impaired                 Upper Body Dressing : Set up;Supervision/safety;Sitting   Lower Body Dressing: Sit to/from stand;Supervision/safety;Set up   Toilet Transfer:  (Modified independent for sit to stand from bed)             General ADL Comments: Educated on energy conservation techniques. Educated on options for shower chair and tub transfer techniques. Recommended pt not get down in tub.  Educated on AE as pt has difficulty with toilet hygiene and washing bottom at home. Suggested pt use wipes. Saw pt walking with PT and pt was doing well.     Vision     Perception     Praxis      Pertinent Vitals/Pain Pain Assessment: No/denies pain; O2 90-91%  on 3L of O2 at end of session-used O2 during session.     Hand Dominance Right   Extremity/Trunk Assessment Upper Extremity Assessment Upper Extremity Assessment: Overall WFL for tasks assessed   Lower Extremity Assessment Lower Extremity Assessment: Defer to PT evaluation   Cervical / Trunk Assessment Cervical / Trunk Assessment: Normal   Communication Communication Communication: No difficulties   Cognition Arousal/Alertness: Awake/alert Behavior During Therapy: WFL for tasks assessed/performed Overall Cognitive Status: Within Functional Limits for tasks assessed                     General Comments       Exercises       Shoulder Instructions      Home Living Family/patient expects to be discharged to:: Private residence Living Arrangements: Spouse/significant other Available Help at Discharge: Family;Available 24 hours/day Type of Home: House Home Access: Stairs to enter [employer] of Steps: 3 Entrance Stairs-Rails: Right Home Layout: One level     Bathroom Shower/Tub: Teacher, early years/pre: Handicapped height     Home Equipment: Nurse, children's - 2 wheels;Crutches;Bedside commode;Adaptive equipment (reports shower chair is breaking-unsure if it is  tub bench?) Adaptive Equipment: Sock aid;Reacher;Long-handled sponge        Prior Functioning/Environment Level of Independence: Needs assistance    ADL's / Homemaking Assistance Needed: assist at times to wash bottom and perform toilet hygiene        OT Diagnosis: Other (comment) (cardiopulmonary status limiting activity)   OT Problem List: Obesity;Cardiopulmonary status limiting activity;Decreased knowledge of precautions;Decreased knowledge of use of DME or AE;Decreased activity tolerance;Decreased range of motion   OT Treatment/Interventions: Self-care/ADL training;DME and/or AE instruction;Energy conservation;Therapeutic activities;Patient/family education     OT Goals(Current goals can be found in the care plan section) Acute Rehab OT Goals Patient Stated Goal: not stated OT Goal Formulation: With patient Time For Goal Achievement: 08/14/15 Potential to Achieve Goals: Good ADL Goals Pt Will Perform Lower Body Bathing: with modified independence;sit to/from stand Pt Will Perform Lower Body Dressing: with modified independence;sit to/from stand Pt Will Transfer to Toilet: with modified independence;ambulating Pt Will Perform Toileting - Clothing Manipulation and hygiene: with modified independence;with adaptive equipment;sit to/from stand Pt Will Perform Tub/Shower Transfer: Tub transfer;with supervision;ambulating (shower equipment TBD)  OT Frequency: Min 2X/week   Barriers to D/C:            Co-evaluation              End of Session Equipment Utilized During Treatment: Oxygen  Activity Tolerance: (pt had just walked with PT prior to OT session) Patient left: with call bell/phone within reach (sitting EOB)   Time: 1100-1116 OT Time Calculation (min): 16 min Charges:  OT General Charges $OT Visit: 1 Procedure OT Evaluation $Initial OT Evaluation Tier I: 1 Procedure G-CodesBenito Mccreedy OTR/L [id number] 08/07/2015, 11:53 AM

## 2015-08-07 NOTE — ED Notes (Signed)
Patient completed hour long treatment.

## 2015-08-07 NOTE — Evaluation (Signed)
Physical Therapy Evaluation Patient Details Name: [name] MRN: [id number] DOB: Apr 07, 1961 Today's Date: 08/07/2015   History of Present Illness  54 y.o. female with h/o R TKA with revision 2014, DM, COPD on 3 L O2 at home, HTN, PE, RLS, CAD, MI, CHF admitted with SOB, cough. Dx of COPD exacerbation.   Clinical Impression  Pt admitted with above diagnosis. Pt currently with functional limitations due to the deficits listed below (see PT Problem List). Pt ambulated 59' + 40' with seated rest break 2* 3/4 dyspnea, SaO2 90% on 3L with walking. Pt stated she's near baseline, but can usually walk a little farther before needing to rest. Pt will benefit from skilled PT to increase their independence and safety with mobility to allow discharge to the venue listed below.       Follow Up Recommendations No PT follow up    Equipment Recommendations  None recommended by PT    Recommendations for Other Services OT consult     Precautions / Restrictions Precautions Precautions: Other (comment);Fall Precaution Comments: monitor O2, h/o falls Restrictions Weight Bearing Restrictions: No      Mobility  Bed Mobility Overal bed mobility: Modified Independent             General bed mobility comments: HOB up 40*, used rail  Transfers Overall transfer level: Independent                  Ambulation/Gait Ambulation/Gait assistance: Supervision Ambulation Distance (Feet): 80 Feet (80' + 14' with seated rest break) Assistive device: None   Gait velocity: WFL   General Gait Details: SaO2 90% on 3L O2 [place], 3/4 dyspnea, seated rest break due to SOB/fatigue, no LOB  Stairs            Wheelchair Mobility    Modified Rankin (Stroke Patients Only)       Balance Overall balance assessment: History of Falls                                           Pertinent Vitals/Pain Pain Assessment: No/denies pain    Home Living Family/patient expects to  be discharged to:: Private residence Living Arrangements: Spouse/significant other Available Help at Discharge: Family;Available 24 hours/day Type of Home: House Home Access: Stairs to enter Entrance Stairs-Rails: Right Entrance Stairs-Number of Steps: 3 Home Layout: One level Home Equipment: Walker - standard;Walker - 2 wheels;Crutches;Bedside commode;Tub bench;Adaptive equipment (tub bench is breaking per pt, OT notified)      Prior Function Level of Independence: Independent               Hand Dominance   Dominant Hand: Right    Extremity/Trunk Assessment   Upper Extremity Assessment: Overall WFL for tasks assessed           Lower Extremity Assessment: Overall WFL for tasks assessed      Cervical / Trunk Assessment: Normal  Communication   Communication: No difficulties  Cognition Arousal/Alertness: Awake/alert Behavior During Therapy: WFL for tasks assessed/performed Overall Cognitive Status: Within Functional Limits for tasks assessed                      General Comments      Exercises        Assessment/Plan    PT Assessment Patient needs continued PT services  PT Diagnosis Generalized weakness   PT Problem  List Decreased activity tolerance;Decreased mobility;Obesity;Cardiopulmonary status limiting activity  PT Treatment Interventions Gait training   PT Goals (Current goals can be found in the Care Plan section) Acute Rehab PT Goals Patient Stated Goal: to walk a little farther PT Goal Formulation: With patient Time For Goal Achievement: 08/21/15 Potential to Achieve Goals: Fair    Frequency Min 3X/week   Barriers to discharge        Co-evaluation               End of Session Equipment Utilized During Treatment: Oxygen Activity Tolerance: Patient limited by fatigue Patient left: in bed;with call bell/phone within reach Nurse Communication: Mobility status         Time: 3151-7616 PT Time Calculation (min) (ACUTE  ONLY): 21 min   Charges:   PT Evaluation $Initial PT Evaluation Tier I: 1 Procedure     PT G CodesBlondell Reveal Kistler 08/07/2015, 11:09 AM [phone]

## 2015-08-07 NOTE — Progress Notes (Signed)
ANTICOAGULATION CONSULT NOTE - Initial Consult  Pharmacy Consult for Lovenox Indication: VTE prophylaxis  No Known Allergies  Patient Measurements: Height: 5\' 3"  (160 cm) Weight: (!) 343 lb 14.7 oz (156 kg) IBW/kg (Calculated) : 52.4  Vital Signs: Temp: 98.4 F (36.9 C) (08/09 0643) BP: 128/65 mmHg (08/09 0643) Pulse Rate: 89 (08/09 0643)  Labs:  Recent Labs  08/06/15 1832 08/07/15 0231  HGB 12.6 11.9*  HCT 38.5 37.5  PLT 168 176  APTT  --  28  LABPROT  --  13.6  INR  --  1.02  CREATININE 0.81 1.01*    Estimated Creatinine Clearance: 93.2 mL/min (by C-G formula based on Cr of 1.01).   Medical History: Past Medical History  Diagnosis Date  . Asthma   . Hypertension   . Hyperlipidemia   . GERD (gastroesophageal reflux disease)   . Depression   . Blood transfusion     2013[facility]  . Headache(784.0)     h/o migraines  . Pulmonary embolism 02/08/12    S/P RT TOTAL KNEE ON 02/03/12--ON 02/08/12--DEVELOPED ACUTE SOB AND CHEST PAIN--AND DIAGNOSED WITH  PULMONARY EMBOLUS AND PNEUMONIA  . Restless leg syndrome   . Myocardial infarction     PT THINKS SHE WAS DX WITH MI AT THE TIME OF HEART STENTING  . CAD (coronary artery disease)     Cath 2010 with DES x 1 RCA-- PT'S CARDIOLOGIST IS DR. Arion  . Uterine fibroid     NO PROBLEMS AT PRESENT FROM THE FIBROIDS-STATES SHE IS POST MENOPAUSAL-LAST MENSES 2010 EXCEPT FOR EPISODE THIS YR OF BLEEDING RELATED TO FIBROIDS.  Marland Kitchen Heart murmur   . Pneumonia 02/08/12    HOSPITALIZED AT [facility] WITH PNEUMONIA AND WITH PULMONARY EMBOLUS  . Diabetes mellitus DIAGNOSED IN2010    ORAL MEDS  . Arthritis     PAIN AND SEVERE OA LEFT KNEE ; S/P RIGHT TKA ON 02/03/12; HAS LOWER BACK PAIN-UNABLE TO STAND MORE THAN 10 MIN; ARTHRITIS "ALL OVER"  . Neck pain   . Weakness     BOTH HANDS - S/P BILATERAL CARPAL TUNNEL RELEASE--BUT STILL HAS WEAKNESS--OFTEN DROPS THINGS  . Shortness of breath     with exertion  . Eczema     on back  . Kidney stones    . Anemia   . COPD (chronic obstructive pulmonary disease)   . Sleep apnea     uses 3 liters O2 at night   . Aortic valve stenosis, severe   . Aortic stenosis   . Morbid obesity with body mass index of 60.0-69.9 in adult   . Chronic diastolic congestive heart failure     Medications:  Scheduled:  . arformoterol  15 mcg Nebulization BID  . aspirin  325 mg Oral Daily  . atorvastatin  80 mg Oral Daily  . [START ON 08/08/2015] azithromycin  250 mg Oral Daily  . budesonide (PULMICORT) nebulizer solution  0.5 mg Nebulization BID  . ciprofloxacin  2 drop Both Eyes Q4H while awake  . citalopram  20 mg Oral Daily  . clopidogrel  75 mg Oral q morning - 10a  . ezetimibe  10 mg Oral Daily  . furosemide  60 mg Oral BID  . gabapentin  900 mg Oral QHS  . insulin aspart  0-15 Units Subcutaneous TID WC  . insulin aspart  0-5 Units Subcutaneous QHS  . insulin glargine  10 Units Subcutaneous QHS  . ipratropium-albuterol  3 mL Nebulization Q4H  . latanoprost  1 drop  Both Eyes QHS  . losartan  50 mg Oral Q breakfast  . methylPREDNISolone (SOLU-MEDROL) injection  60 mg Intravenous Q12H  . metoprolol tartrate  25 mg Oral BID  . [START ON 08/08/2015] potassium chloride  40 mEq Oral Daily  . sodium chloride  3 mL Intravenous Q12H  . vitamin B-12  100 mcg Oral Daily    Assessment: 54 yo patient presenting with shortness of breath, cough with productive sputum, runny nose chills.  Found with WBC, temp, BNP, troponine all within normal limits.  Pharmacy consulted for lovenox for VTE prophylaxis  Results for DARLY, MASSI (MRN [id number]) as of 08/07/2015 13:43  Ref. Range 08/06/2015 18:32 08/07/2015 02:31  Hemoglobin Latest Ref Range: 12.0-15.0 g/dL 12.6 11.9 (L)  HCT Latest Ref Range: 36.0-46.0 % 38.5 37.5    Goal of Therapy:   Monitor platelets by anticoagulation protocol: Yes   Plan:  - Lovenox 80 mg sub-q every 24 hours (0.5mg /kg) - Monitor CBC, signs and symptoms of bleeding   Reatha Harps, Pharm.D., BCPS Clinical Pharmacist [phone] Pager 08/07/2015 1:51 PM

## 2015-08-07 NOTE — Progress Notes (Addendum)
Attempted ABG x2 and x2 by another RT. Unable to obtain ABG sample. Patient is alert, speaking in full sentences, on 3 nasal cannula. No distress at this time. Paged MD x2. RN aware.

## 2015-08-08 DIAGNOSIS — F329 Major depressive disorder, single episode, unspecified: Secondary | ICD-10-CM

## 2015-08-08 DIAGNOSIS — I5032 Chronic diastolic (congestive) heart failure: Secondary | ICD-10-CM

## 2015-08-08 LAB — BASIC METABOLIC PANEL
ANION GAP: 10 (ref 5–15)
BUN: 16 mg/dL (ref 6–20)
CALCIUM: 9.2 mg/dL (ref 8.9–10.3)
CHLORIDE: 95 mmol/L — AB (ref 101–111)
CO2: 33 mmol/L — ABNORMAL HIGH (ref 22–32)
Creatinine, Ser: 0.93 mg/dL (ref 0.44–1.00)
GFR calc Af Amer: >60 mL/min (ref >60)
GFR calc non Af Amer: >60 mL/min (ref >60)
Glucose, Bld: 217 mg/dL — ABNORMAL HIGH (ref 65–99)
Potassium: 5.6 mmol/L — ABNORMAL HIGH (ref 3.5–5.1)
Sodium: 138 mmol/L (ref 135–145)

## 2015-08-08 LAB — GLUCOSE, CAPILLARY
Glucose-Capillary: 207 mg/dL — ABNORMAL HIGH (ref 65–99)
Glucose-Capillary: 226 mg/dL — ABNORMAL HIGH (ref 65–99)

## 2015-08-08 LAB — HIV ANTIBODY (ROUTINE TESTING W REFLEX): HIV Screen 4th Generation wRfx: NONREACTIVE

## 2015-08-08 LAB — LEGIONELLA ANTIGEN, URINE

## 2015-08-08 MED ORDER — ALBUTEROL SULFATE (2.5 MG/3ML) 0.083% IN NEBU
2.5000 mg | INHALATION_SOLUTION | RESPIRATORY_TRACT | Status: DC | PRN
Start: 2015-08-08 — End: 2018-03-08

## 2015-08-08 MED ORDER — PREDNISONE 20 MG PO TABS: ORAL_TABLET | ORAL | Status: DC

## 2015-08-08 MED ORDER — AZITHROMYCIN 250 MG PO TABS: 250.0000 mg | ORAL_TABLET | Freq: Every day | ORAL | Status: DC

## 2015-08-08 MED ORDER — IPRATROPIUM-ALBUTEROL 0.5-2.5 (3) MG/3ML IN SOLN
3.0000 mL | Freq: Three times a day (TID) | RESPIRATORY_TRACT | Status: DC
  Filled 2015-08-08: qty 3

## 2015-08-08 MED ORDER — GUAIFENESIN-CODEINE 100-10 MG/5ML PO SOLN
5.0000 mL | ORAL | Status: DC | PRN
Start: 2015-08-08 — End: 2016-11-05

## 2015-08-08 MED ORDER — CIPROFLOXACIN HCL 0.3 % OP SOLN: 2.0000 [drp] | OPHTHALMIC | Status: DC

## 2015-08-08 MED ORDER — INSULIN ASPART 100 UNIT/ML ~~LOC~~ SOLN
3.0000 [IU] | Freq: Three times a day (TID) | SUBCUTANEOUS | Status: DC
  Administered 2015-08-08 (×2): 3 [IU] via SUBCUTANEOUS

## 2015-08-08 MED ORDER — GLIPIZIDE 5 MG PO TABS: 10.0000 mg | ORAL_TABLET | Freq: Every day | ORAL | Status: DC

## 2015-08-08 NOTE — Progress Notes (Signed)
[name] to be D/C'd Home per MD order.  Discussed with the patient and all questions fully answered.  VSS, Skin clean, dry and intact without evidence of skin break down, no evidence of skin tears noted. IV catheter discontinued intact. Site without signs and symptoms of complications. Dressing and pressure applied.  An After Visit Summary was printed and given to the patient. Patient received prescription.  D/c education completed with patient/family including follow up instructions, medication list, d/c activities limitations if indicated, with other d/c instructions as indicated by MD - patient able to verbalize understanding, all questions fully answered.   Patient instructed to return to ED, call 911, or call MD for any changes in condition.   Patient escorted via [place], and D/C home via private auto.  L'ESPERANCE, Kerilyn Cortner C 08/08/2015 1:44 PM

## 2015-08-08 NOTE — Care Management Note (Addendum)
Case Management Note  Patient Details  Name: [name] MRN: [id number] Date of Birth: 09/14/1961  Subjective/Objective:    NCM spoke with patient , she states she will need a shower stool, she chose [facility], referral given to [facility] with [facility], he will bring up to patient's room, she is for dc today, patient has oxygen with [facility] but has no one to bring her tank, Brenton Grills will also bring a oxygen tank up to room for patient to go home with.   Per pt/ot eval , no needs.             Action/Plan:   Expected Discharge Date:                  Expected Discharge Plan:  Home/Self Care  In-House Referral:     Discharge planning Services  CM Consult  Post Acute Care Choice:  Durable Medical Equipment Choice offered to:     DME Arranged:  Shower stool DME Agency:  [place]:    [facility] Agency:     Status of Service:  Completed, signed off  Medicare Important Message Given:    Date Medicare IM Given:    Medicare IM give by:    Date Additional Medicare IM Given:    Additional Medicare Important Message give by:     If discussed at [place] of Stay Meetings, dates discussed:    Additional Comments:  Zenon Mayo, RN 08/08/2015, 12:25 PM

## 2015-08-08 NOTE — Discharge Summary (Signed)
Physician Discharge Summary  [name] ZDG:[id number] DOB: 10-Feb-1961 DOA: 08/06/2015  PCP: Maggie Font, MD  Admit date: 08/06/2015 Discharge date: 08/08/2015  Recommendations for Outpatient Follow-up:  1. Continue 3 L home oxygen 2. Started on prednisone and antibiotics to continue at home 3. Follow up with primary care doctor within 1-2 weeks of discharge for reevaluation, repeat BMP and CBC 4. Glipizide doubled for now until her blood sugars trend down.  Advised to call primary care doctor for further instructions if it remains high.    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   Essential hypertension   Morbid obesity   Depression   Discharge Condition: Stable, improved  Diet recommendation: Diabetic  Wt Readings from Last 3 Encounters:  08/08/15 155.2 kg (342 lb 2.5 oz)  06/16/15 155.674 kg (343 lb 3.2 oz)  01/26/15 157.852 kg (348 lb)    History of present illness:  The patient is a 54 year old female with history of diabetes mellitus type 2, hypertension, hyperlipidemia, COPD on 3 L home oxygen, GERD, depression, history of pulmonary embolism, CAD status post MI and cardiac stent, severe AS status post prosthetic aortic valve replacement, chronic diastolic heart failure with preserved ejection fraction, morbid obesity, who presented with cough and shortness of breath. She reported having worsening shortness of breath over the 3 days prior to admission with cough productive of yellow sputum, runny nose, sore throat, and chills. A family member had a URI. In the emergency department, she was found to have normal white blood cell count,  normal temperature, normal BNP and troponin, however she had some respiratory distress and was admitted for further treatment. She was started on IV Solu-Medrol and duo nebs.  Hospital Course:   Acute on chronic hypoxic respiratory failure secondary to acute COPD exacerbation. She was admitted to telemetry which demonstrated normal sinus rhythm interspersed with sinus tachycardia. Her telemetry was discontinued. She was started on IV Solu-Medrol with duo nebs. She was started on nebulized inhaled cortical steroids and long-acting beta agonist in place of her MDI. She was also started on azithromycin. Her influenza PCR, strep pneumo and Legionella antigens were negative. Her HIV test was nonreactive. She clinically improved over the course of several days.  She will be discharged home on a prednisone taper and on azithromycin. At the time of discharge, she was able to ambulate and complete ADLs on her 3 L nasal cannula without difficulty. Her cough was much better and she had minimal wheezing on exam.  Possible acute on chronic diastolic congestive heart failure. Her echocardiogram in January 2016 demonstrated preserved ejection fraction with grade 1 diastolic dysfunction. Her BMP was only 40, but this is in the setting doing of obesity. She had some bilateral lower extremity pitting edema. I increased her Lasix to 60 mg by mouth twice a day for a day which improved her lower extremity edema. She was advised to continue 60 mg by mouth twice a day for an additional day and then resume her home dose of 40 mg twice a day.  Coronary artery disease, chest pain-free. She continued aspirin, Plavix, metoprolol, and high dose statin.  Essential hypertension/hyperlipidemia, continued ARB, beta blocker, high-dose statin, Zetia.  GERD,  stable, continue to Protonix.  Diabetes mellitus type 2, last hemoglobin A1c of 7 on 06/15/2015. She normally takes glipizide at home. She had some moderate hyperglycemia during  hospitalization secondary to steroids. We are tapering her steroids quickly over the course of several days. She was advised to increase her glipizide to twice her normal dose for the next 3 days, then resume her home dose.  Bilateral conjunctivitis, she was started on antibiotics eyedrops.  Depression, stable, no suicidal or homicidal ideation, continued Celexa.  Consultants:  none  Procedures:  CXR  Antibiotics:  Azithromycin 8/9   Discharge Exam: Filed Vitals:   08/08/15 1007  BP: 122/42  Pulse: 94  Temp:   Resp:    Filed Vitals:   08/07/15 2312 08/08/15 0500 08/08/15 0648 08/08/15 1007  BP: 137/45  142/60 122/42  Pulse: 73  68 94  Temp: 99.3 F (37.4 C)  98.2 F (36.8 C)   TempSrc: Oral  Oral   Resp: 20  20   Height:      Weight:  155.2 kg (342 lb 2.5 oz)    SpO2: 95%  93%      General: Obese adult female, no acute distress   HEENT: NCAT, MMM  Cardiovascular: RRR, nl S1, S2 no mrg, 2+ pulses, warm extremities  Respiratory: Diminished bilateral breath sounds, wheeze has resolved, no rales or rhonchi  Abdomen: NABS, soft, NT/ND  MSK: Normal tone and bulk, 1+ but improved pitting bilateral LEE  Neuro: Grossly intact  Discharge Instructions      Discharge Instructions    (HEART FAILURE PATIENTS) Call MD:  Anytime you have any of the following symptoms: 1) 3 pound weight gain in 24 hours or 5 pounds in 1 week 2) shortness of breath, with or without a dry hacking cough 3) swelling in the hands, feet or stomach 4) if you have to sleep on extra pillows at night in order to breathe.    Complete by:  As directed      Call MD for:  difficulty breathing, headache or visual disturbances    Complete by:  As directed      Call MD for:  extreme fatigue    Complete by:  As directed      Call MD for:  hives    Complete by:  As directed      Call MD for:  persistant dizziness or light-headedness    Complete by:  As directed      Call MD for:   persistant nausea and vomiting    Complete by:  As directed      Call MD for:  severe uncontrolled pain    Complete by:  As directed      Call MD for:  temperature >100.4    Complete by:  As directed      Diet Carb Modified    Complete by:  As directed      Discharge instructions    Complete by:  As directed   You were hospitalized with difficulty breathing and were treated for COPD exacerbation and pneumonia.  You should continue azithromycin for the next three days, your next dose is due tomorrow.  Continue prednisone for the next several days in decreasing doses.  You next dose is due tomorrow.  If you have worsening shortness of breath, please seek immediate medical assistance.  For the swelling in your legs, just for ONE day, please take a tab and a half of lasix (60mg ) twice a day, then  resume your previous dose of 40mg  twice a day on Friday.  For your diabetes, your blood sugars are high because of your steroids.  For now, please double your glipizide to 10mg  daily and check your blood sugar in the morning.  It will probably stay high as long as you are on your steroids.  Once your morning blood sugar is less than 140, you may go back to taking 5mg  once a day.  If your blood sugars are consistently greater than 350, please call your primary care doctor before your next appointment to get further instructions.     Increase activity slowly    Complete by:  As directed             Medication List    STOP taking these medications        guaiFENesin 600 MG 12 hr tablet  Commonly known as:  MUCINEX     potassium chloride SA 20 MEQ tablet  Commonly known as:  K-DUR,KLOR-CON      TAKE these medications        albuterol 108 (90 BASE) MCG/ACT inhaler  Commonly known as:  PROVENTIL HFA;VENTOLIN HFA  Inhale 2 puffs into the lungs every 4 (four) hours as needed for wheezing or shortness of breath.     albuterol (2.5 MG/3ML) 0.083% nebulizer solution  Commonly known as:  PROVENTIL  Take  2.5 mg by nebulization every 4 (four) hours as needed for wheezing or shortness of breath.     aspirin 325 MG tablet  Take 325 mg by mouth daily.     atorvastatin 80 MG tablet  Commonly known as:  LIPITOR  Take 80 mg by mouth daily.     azithromycin 250 MG tablet  Commonly known as:  ZITHROMAX  Take 1 tablet (250 mg total) by mouth daily.     budesonide-formoterol 160-4.5 MCG/ACT inhaler  Commonly known as:  SYMBICORT  Inhale 2 puffs into the lungs 2 (two) times daily.     ciprofloxacin 0.3 % ophthalmic solution  Commonly known as:  CILOXAN  Place 2 drops into both eyes every 4 (four) hours while awake. Administer 1 drop, every 2 hours, while awake, for 2 days. Then 1 drop, every 4 hours, while awake, for the next 5 days.     citalopram 20 MG tablet  Commonly known as:  CELEXA  Take 20 mg by mouth daily.     clopidogrel 75 MG tablet  Commonly known as:  PLAVIX  Take 75 mg by mouth every morning.     ezetimibe 10 MG tablet  Commonly known as:  ZETIA  Take 10 mg by mouth daily.     furosemide 40 MG tablet  Commonly known as:  LASIX  Take 40 mg by mouth 2 (two) times daily.     gabapentin 300 MG capsule  Commonly known as:  NEURONTIN  Take 900 mg by mouth at bedtime.     glipiZIDE 5 MG tablet  Commonly known as:  GLUCOTROL  Take 2 tablets (10 mg total) by mouth daily before supper.     guaiFENesin-codeine 100-10 MG/5ML syrup  Take 5 mLs by mouth every 4 (four) hours as needed for cough.     latanoprost 0.005 % ophthalmic solution  Commonly known as:  XALATAN  Place 1 drop into both eyes at bedtime.     losartan 50 MG tablet  Commonly known as:  COZAAR  Take 1 tablet (50 mg total) by mouth daily with breakfast.  MAGNESIUM PO  Take 1-2 tablets by mouth daily as needed (constipation).     metoprolol tartrate 25 MG tablet  Commonly known as:  LOPRESSOR  Take 1 tablet (25 mg total) by mouth 2 (two) times daily.     OXYGEN  Inhale 3 L into the lungs daily.      predniSONE 20 MG tablet  Commonly known as:  DELTASONE  Take 3 tabs daily for 2 days, 2 tabs daily for 2 days, 1 tab daily for 2 days, half tab daily for 2 days, then stop     VITAMIN B-12 PO  Take 1 tablet by mouth every morning.       Follow-up Information    Follow up with [facility].   Why:  shower stool,  also will bring oxygen tank up for patient to go home with   Contact information:   [address] [place] [address] [phone]       Schedule an appointment as soon as possible for a visit with Maggie Font, MD.   Specialty:  Family Medicine   Contact information:   [address] [place] [address] [phone]        The results of significant diagnostics from this hospitalization (including imaging, microbiology, ancillary and laboratory) are listed below for reference.    Significant Diagnostic Studies: Dg Chest 2 View  08/06/2015   CLINICAL DATA:  Shortness of breath with productive cough  EXAM: CHEST  2 VIEW  COMPARISON:  June 14, 2015  FINDINGS: There is mild scarring in the left base. There is no edema or consolidation. Heart size and pulmonary vascularity are normal. No adenopathy. 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 No bone lesions. There is mild degenerative change in the thoracic spine.  IMPRESSION: Mild scarring left base. No edema or consolidation. Prosthetic aortic valve present.   Electronically Signed   By: Lowella Grip III M.D.   On: 08/06/2015 19:22    Microbiology: Recent Results (from the past 240 hour(s))  Culture, sputum-assessment     Status: None   Collection Time: 08/07/15  2:50 AM  Result Value Ref Range Status   Specimen Description SPUTUM  Final   Special Requests NONE  Final   Sputum evaluation   Final    THIS SPECIMEN IS ACCEPTABLE. RESPIRATORY CULTURE REPORT TO FOLLOW.   Report Status 08/07/2015 FINAL  Final  Culture, respiratory (NON-Expectorated)     Status: None (Preliminary result)    Collection Time: 08/07/15  2:50 AM  Result Value Ref Range Status   Specimen Description SPUTUM  Final   Special Requests NONE  Final   Gram Stain   Final    ABUNDANT WBC PRESENT, PREDOMINANTLY PMN FEW SQUAMOUS EPITHELIAL CELLS PRESENT MODERATE GRAM NEGATIVE COCCOBACILLI MODERATE GRAM POSITIVE COCCI IN PAIRS    Culture   Final    Culture reincubated for better growth Performed at [facility]    Report Status PENDING  Incomplete     Labs: Basic Metabolic Panel:  Recent Labs Lab 08/06/15 1832 08/07/15 0231 08/08/15 0644  NA 138 137 138  K 4.0 3.2* 5.6*  CL 97* 97* 95*  CO2 29 30 33*  GLUCOSE 248* 273* 217*  BUN 8 10 16   CREATININE 0.81 1.01* 0.93  CALCIUM 9.0 9.2 9.2   Liver Function Tests: No results for input(s): AST, ALT, ALKPHOS, BILITOT, PROT, ALBUMIN in the last 168 hours. No results for input(s): LIPASE, AMYLASE in  the last 168 hours. No results for input(s): AMMONIA in the last 168 hours. CBC:  Recent Labs Lab 08/06/15 1832 08/07/15 0231  WBC 8.4 10.0  NEUTROABS 5.9  --   HGB 12.6 11.9*  HCT 38.5 37.5  MCV 83.9 84.5  PLT 168 176   Cardiac Enzymes: No results for input(s): CKTOTAL, CKMB, CKMBINDEX, TROPONINI in the last 168 hours. BNP: BNP (last 3 results)  Recent Labs  08/06/15 1832  BNP 40.5    ProBNP (last 3 results) No results for input(s): PROBNP in the last 8760 hours.  CBG:  Recent Labs Lab 08/07/15 1134 08/07/15 1651 08/07/15 2308 08/08/15 0745 08/08/15 1204  GLUCAP 229* 304* 243* 207* 226*    Time coordinating discharge: 35 minutes  Signed:  Krystan Northrop  [facility] 08/08/2015, 1:29 PM

## 2015-08-08 NOTE — Progress Notes (Signed)
  Spoke with pt for about 2-3 minutes about tub bench/tub transfer and dropped off her an energy conservation handout. Pt did not want to get up and work with OT and is leaving today. Pt reports she feels good about information OT has covered. Nurse reports pt bathed herself today.   Roseanne Reno, OTR/L [phone]

## 2015-08-09 LAB — CULTURE, RESPIRATORY W GRAM STAIN

## 2015-08-09 LAB — CULTURE, RESPIRATORY: CULTURE: NORMAL

## 2015-08-12 LAB — CULTURE, BLOOD (ROUTINE X 2)
Culture: NO GROWTH
Culture: NO GROWTH

## 2015-08-13 ENCOUNTER — Ambulatory Visit: Payer: Medicaid Other | Admitting: Physician Assistant

## 2015-08-28 ENCOUNTER — Ambulatory Visit: Payer: Medicaid Other | Admitting: Physician Assistant

## 2015-08-29 ENCOUNTER — Encounter: Payer: Self-pay | Admitting: Physician Assistant

## 2015-09-14 ENCOUNTER — Telehealth: Payer: Self-pay | Admitting: *Deleted

## 2015-09-14 DIAGNOSIS — I359 Nonrheumatic aortic valve disorder, unspecified: Secondary | ICD-10-CM

## 2015-09-14 NOTE — Telephone Encounter (Signed)
I spoke with pt and scheduled 1 year post TAVR echo for 12/28/15 at 9:30 and appt with Dr. Angelena Form the same day at 11:00

## 2015-12-28 ENCOUNTER — Telehealth: Payer: Self-pay

## 2015-12-28 ENCOUNTER — Ambulatory Visit (HOSPITAL_COMMUNITY): Payer: Medicaid Other | Attending: Cardiology

## 2015-12-28 ENCOUNTER — Ambulatory Visit (INDEPENDENT_AMBULATORY_CARE_PROVIDER_SITE_OTHER): Payer: Medicaid Other | Admitting: Cardiovascular Disease

## 2015-12-28 ENCOUNTER — Encounter: Payer: Self-pay | Admitting: Cardiovascular Disease

## 2015-12-28 ENCOUNTER — Other Ambulatory Visit: Payer: Self-pay

## 2015-12-28 VITALS — BP 103/57 | HR 73 | Ht 61.0 in | Wt 349.4 lb

## 2015-12-28 DIAGNOSIS — I351 Nonrheumatic aortic (valve) insufficiency: Secondary | ICD-10-CM | POA: Insufficient documentation

## 2015-12-28 DIAGNOSIS — Z953 Presence of xenogenic heart valve: Secondary | ICD-10-CM

## 2015-12-28 DIAGNOSIS — Z6841 Body Mass Index (BMI) 40.0 and over, adult: Secondary | ICD-10-CM | POA: Diagnosis not present

## 2015-12-28 DIAGNOSIS — Z87891 Personal history of nicotine dependence: Secondary | ICD-10-CM | POA: Insufficient documentation

## 2015-12-28 DIAGNOSIS — E785 Hyperlipidemia, unspecified: Secondary | ICD-10-CM | POA: Insufficient documentation

## 2015-12-28 DIAGNOSIS — Z954 Presence of other heart-valve replacement: Secondary | ICD-10-CM

## 2015-12-28 DIAGNOSIS — I350 Nonrheumatic aortic (valve) stenosis: Secondary | ICD-10-CM | POA: Diagnosis not present

## 2015-12-28 DIAGNOSIS — I359 Nonrheumatic aortic valve disorder, unspecified: Secondary | ICD-10-CM | POA: Diagnosis not present

## 2015-12-28 DIAGNOSIS — E119 Type 2 diabetes mellitus without complications: Secondary | ICD-10-CM | POA: Insufficient documentation

## 2015-12-28 DIAGNOSIS — I2699 Other pulmonary embolism without acute cor pulmonale: Secondary | ICD-10-CM

## 2015-12-28 DIAGNOSIS — I059 Rheumatic mitral valve disease, unspecified: Secondary | ICD-10-CM | POA: Diagnosis not present

## 2015-12-28 DIAGNOSIS — I2510 Atherosclerotic heart disease of native coronary artery without angina pectoris: Secondary | ICD-10-CM | POA: Diagnosis not present

## 2015-12-28 DIAGNOSIS — I517 Cardiomegaly: Secondary | ICD-10-CM | POA: Diagnosis not present

## 2015-12-28 LAB — LIPID PANEL
CHOL/HDL RATIO: 3.7 ratio (ref <=5.0)
Cholesterol: 213 mg/dL — ABNORMAL HIGH (ref 125–200)
HDL: 58 mg/dL (ref >=46)
LDL Cholesterol: 135 mg/dL — ABNORMAL HIGH (ref <130)
Triglycerides: 100 mg/dL (ref <150)
VLDL: 20 mg/dL (ref <30)

## 2015-12-28 LAB — HEPATIC FUNCTION PANEL
ALBUMIN: 3.7 g/dL (ref 3.6–5.1)
ALT: 9 U/L (ref 6–29)
AST: 12 U/L (ref 10–35)
Alkaline Phosphatase: 66 U/L (ref 33–130)
BILIRUBIN TOTAL: 0.9 mg/dL (ref 0.2–1.2)
Bilirubin, Direct: 0.2 mg/dL (ref <=0.2)
Indirect Bilirubin: 0.7 mg/dL (ref 0.2–1.2)
Total Protein: 6.9 g/dL (ref 6.1–8.1)

## 2015-12-28 MED ORDER — ASPIRIN EC 81 MG PO TBEC: 81.0000 mg | DELAYED_RELEASE_TABLET | Freq: Every day | ORAL | Status: DC

## 2015-12-28 MED ORDER — EZETIMIBE 10 MG PO TABS: 10.0000 mg | ORAL_TABLET | Freq: Every day | ORAL | Status: DC

## 2015-12-28 NOTE — Telephone Encounter (Signed)
PA obtained for Zetia.

## 2015-12-28 NOTE — Telephone Encounter (Signed)
Prior auth obtained for Zetia 10mg  from [facility] [employer]. Case # [id number], is good for one year. Pharmacy notified.

## 2015-12-28 NOTE — Progress Notes (Signed)
Chief Complaint  Patient presents with  . Aortic Stenosis    no complaints today  . Hyperlipidemia  . Follow-up    History of Present Illness: 54 yo WF with history of severe aortic stenosis now s/p TAVR,  CAD, OSA, HTN, HLD, obesity, GERD, asthma, COPD, former tobacco abuse, depression here today for cardiac follow up.This is her one year visit following TAVR. She had a cardiac cath in February 2010 and had a drug eluting stent (2.46mm Xience) placed in the distal RCA per Dr. Melvern Banker. Her LAD and Circumflex were free of disease. She had her right knee replaced in February 8,2013. She developed SOB on 02/08/12 and was found to have a PE and pneumonia. She was started on coumadin and treated for 6 months. CTA chest on 08/09/12 showed no evidence of PE so her coumadin was stopped. I saw her February 20,2014 and she c/o severe dyspnea with minimal exertion. I felt that this was multi-factorial. Chest CTA with no PE. I arranged a stress myoview which showed possible ischemia. Echo with severe AS, normal LV function. Cardiac cath on 03/17/13 with mild non-obstructive disease and patent stent RCA. TEE with moderate AS. She continued to complain of dyspnea with minimal exertion. Echo  08/10/14 with normal LV function, severe AS with mean gradient of 50 mmHg, peak gradient 77 mmHg. Cardiac cath 09/14/14 with 70% ostial RCA stenosis. No obstructive disease in the LAD or Circumflex. She was then evaluated for AVR and eventually had transcatheter AVR on 12/12/14 with a 26 mm Sapien 3 bioprosthetic valve. She did well following the procedure and was discharged home on 12/16/14.   She is here today for cardiac follow up.  She is feeling well. No chest pain. Baseline dyspnea.   Echo today with no evidence of AI. Valve seems to be working well with elevated gradient, unchanged. Normal LV function.  .  Primary Care Physician: [facility]  Past Medical History  Diagnosis Date  . Asthma   . Hypertension   .  Hyperlipidemia   . GERD (gastroesophageal reflux disease)   . Depression   . Blood transfusion     2013[facility]  . Headache(784.0)     h/o migraines  . Pulmonary embolism ([place]) 02/08/12    S/P RT TOTAL KNEE ON 02/03/12--ON 02/08/12--DEVELOPED ACUTE SOB AND CHEST PAIN--AND DIAGNOSED WITH  PULMONARY EMBOLUS AND PNEUMONIA  . Restless leg syndrome   . Myocardial infarction ([place])     PT THINKS SHE WAS DX WITH MI AT THE TIME OF HEART STENTING  . CAD (coronary artery disease)     Cath 2010 with DES x 1 RCA-- PT'S CARDIOLOGIST IS DR. Hendricks  . Uterine fibroid     NO PROBLEMS AT PRESENT FROM THE FIBROIDS-STATES SHE IS POST MENOPAUSAL-LAST MENSES 2010 EXCEPT FOR EPISODE THIS YR OF BLEEDING RELATED TO FIBROIDS.  Marland Kitchen Heart murmur   . Pneumonia 02/08/12    HOSPITALIZED AT [facility] WITH PNEUMONIA AND WITH PULMONARY EMBOLUS  . Diabetes mellitus DIAGNOSED IN2010    ORAL MEDS  . Arthritis     PAIN AND SEVERE OA LEFT KNEE ; S/P RIGHT TKA ON 02/03/12; HAS LOWER BACK PAIN-UNABLE TO STAND MORE THAN 10 MIN; ARTHRITIS "ALL OVER"  . Neck pain   . Weakness     BOTH HANDS - S/P BILATERAL CARPAL TUNNEL RELEASE--BUT STILL HAS WEAKNESS--OFTEN DROPS THINGS  . Shortness of breath     with exertion  . Eczema     on back  .  Kidney stones   . Anemia   . COPD (chronic obstructive pulmonary disease) ([place])   . Sleep apnea     uses 3 liters O2 at night   . Aortic valve stenosis, severe   . Aortic stenosis   . Morbid obesity with body mass index of 60.0-69.9 in adult [facility])   . Chronic diastolic congestive heart failure [facility])     Past Surgical History  Procedure Laterality Date  . Tubal ligation    . Carpal tunnel release      Bilateral  . Cholecystectomy    . Hernia repair    . Cardiac catheterization    . Knee arthroplasty  02/03/2012    Procedure: COMPUTER ASSISTED TOTAL KNEE ARTHROPLASTY;  Surgeon: Mcarthur Rossetti, MD;  Location: [place];  Service: Orthopedics;  Laterality: Right;  Right total knee arthroplasty    . Coronary angioplasty      2010 has stent in place  . Total knee arthroplasty  09/10/2012    Procedure: TOTAL KNEE ARTHROPLASTY;  Surgeon: Mcarthur Rossetti, MD;  Location: [facility];  Service: Orthopedics;  Laterality: Left;  . Trigger finger release  09/10/2012    Procedure: RELEASE TRIGGER FINGER/A-1 PULLEY;  Surgeon: Mcarthur Rossetti, MD;  Location: [facility];  Service: Orthopedics;  Laterality: Right;  Right Ring Finger  . Tee without cardioversion N/A 03/14/2013    Procedure: TRANSESOPHAGEAL ECHOCARDIOGRAM (TEE);  Surgeon: Lelon Perla, MD;  Location: [facility] ENDOSCOPY;  Service: Cardiovascular;  Laterality: N/A;  . Joint replacement      bil total knees  . Total knee revision Right 07/15/2013    Procedure: REVISION ARTHROPLASTY RIGHT KNEE;  Surgeon: Mcarthur Rossetti, MD;  Location: [facility];  Service: Orthopedics;  Laterality: Right;  . Cystoscopy w/ retrogrades Right 09/21/2013    Procedure: CYSTOSCOPY WITH RIGHT RETROGRADE PYELOGRAM RIGHT DOUBLE J STENT ;  Surgeon: Fredricka Bonine, MD;  Location: [facility];  Service: Urology;  Laterality: Right;  . Cystoscopy with ureteroscopy and stent placement Right 10/25/2013    Procedure: CYSTOSCOPY RIGHT URETEROSCOPY HOLMIUM LASER LITHO AND STENT PLACEMENT;  Surgeon: Fredricka Bonine, MD;  Location: [facility];  Service: Urology;  Laterality: Right;  . Tee without cardioversion N/A 11/14/2014    Procedure: TRANSESOPHAGEAL ECHOCARDIOGRAM (TEE);  Surgeon: Lelon Perla, MD;  Location[facility] END[facility]COPY;  Service: Cardiovascular;  Laterality: N/A;  . Left and right heart catheterization with coronary angiogram N/A 03/17/2013    Procedure: LEFT AND RIGHT HEART CATHETERIZATION WITH CORONARY ANGIOGRAM;  Surgeon: Burnell Blanks, MD;  Location: [facility] CATH LAB;  Service: Cardiovascular;  Laterality: N/A;  . Left and right heart catheterization with coronary/graft angiogram N/A 09/14/2014    Procedure: LEFT AND RIGHT HEART CATHETERIZATION  WITH Beatrix Fetters;  Surgeon: Burnell Blanks, MD;  Location: [facility] CATH LAB;  Service: Cardiovascular;  Laterality: N/A;  . Transcatheter aortic valve replacement, transfemoral N/A 12/12/2014    Procedure: TRANSCATHETER AORTIC VALVE REPLACEMENT, TRANSFEMORAL;  Surgeon: Burnell Blanks, MD;  Location: [place];  Service: Cardiovascular;  Laterality: N/A;  . Intraoperative transesophageal echocardiogram N/A 12/12/2014    Procedure: INTRAOPERATIVE TRANSESOPHAGEAL ECHOCARDIOGRAM;  Surgeon: Burnell Blanks, MD;  Location: [facility] OR;  Service: Cardiovascular;  Laterality: N/A;    Current Outpatient Prescriptions  Medication Sig Dispense Refill  . albuterol (PROVENTIL HFA;VENTOLIN HFA) 108 (90 BASE) MCG/ACT inhaler Inhale 2 puffs into the lungs every 4 (four) hours as needed for wheezing or shortness of breath.     Marland Kitchen albuterol (PROVENTIL) (  2.5 MG/3ML) 0.083% nebulizer solution Take 3 mLs (2.5 mg total) by nebulization every 4 (four) hours as needed for wheezing or shortness of breath. 150 mL 0  . aspirin 325 MG tablet Take 325 mg by mouth daily.    Marland Kitchen atorvastatin (LIPITOR) 80 MG tablet Take 80 mg by mouth daily.    . budesonide-formoterol (SYMBICORT) 160-4.5 MCG/ACT inhaler Inhale 2 puffs into the lungs 2 (two) times daily.    . ciprofloxacin (CILOXAN) 0.3 % ophthalmic solution Place 2 drops into both eyes every 4 (four) hours while awake. Administer 1 drop, every 2 hours, while awake, for 2 days. Then 1 drop, every 4 hours, while awake, for the next 5 days. 5 mL 0  . citalopram (CELEXA) 20 MG tablet Take 20 mg by mouth daily.    . clopidogrel (PLAVIX) 75 MG tablet Take 75 mg by mouth every morning.     . ezetimibe (ZETIA) 10 MG tablet Take 10 mg by mouth daily.    . furosemide (LASIX) 40 MG tablet Take 40 mg by mouth 2 (two) times daily.    Marland Kitchen gabapentin (NEURONTIN) 300 MG capsule Take 900 mg by mouth at bedtime.     Marland Kitchen glipiZIDE (GLUCOTROL) 5 MG tablet Take 2 tablets (10 mg total)  by mouth daily before supper. 60 tablet 0  . guaiFENesin-codeine 100-10 MG/5ML syrup Take 5 mLs by mouth every 4 (four) hours as needed for cough. 120 mL 0  . latanoprost (XALATAN) 0.005 % ophthalmic solution Place 1 drop into both eyes at bedtime.    Marland Kitchen losartan (COZAAR) 50 MG tablet Take 1 tablet (50 mg total) by mouth daily with breakfast. 30 tablet 1  . MAGNESIUM PO Take 1-2 tablets by mouth daily as needed (constipation).     . metoprolol tartrate (LOPRESSOR) 25 MG tablet Take 1 tablet (25 mg total) by mouth 2 (two) times daily. 180 tablet 3  . OXYGEN-HELIUM IN Inhale 3 L into the lungs daily.      No current facility-administered medications for this visit.    No Known Allergies  Social History   Social History  . Marital Status: Married    Spouse Name: N/A  . Number of Children: 2  . Years of Education: N/A   Occupational History  . Disabled    Social History Main Topics  . Smoking status: Former Smoker -- 1.50 packs/day for 30 years    Types: Cigarettes    Quit date: 12/29/2000  . Smokeless tobacco: Never Used  . Alcohol Use: 0.6 oz/week    1 Cans of beer per week     Comment: rarely  . Drug Use: No  . Sexual Activity: No   Other Topics Concern  . Not on file   Social History Narrative    Family History  Problem Relation Age of Onset  . Breast cancer Mother   . COPD Father     smoked  . Cancer Brother     Sinus  . Emphysema Mother     smoked  . Asthma Father   . Heart disease Mother   . Heart disease Father     Review of Systems:  As stated in the HPI and otherwise negative.   BP 103/57 mmHg  Pulse 73  Ht 5\' 1"  (1.549 m)  Wt 349 lb 6.4 oz (158.487 kg)  BMI 66.05 kg/m2  SpO2 89%  LMP 07/26/2009  Physical Examination: General: Well developed, well nourished, NAD HEENT: OP clear, mucus membranes moist SKIN: warm,  dry. No rashes. Neuro: No focal deficits Musculoskeletal: Muscle strength 5/5 all ext Psychiatric: Mood and affect normal Neck: No  JVD, no carotid bruits, no thyromegaly, no lymphadenopathy. Lungs:Clear bilaterally, no wheezes, rhonci, crackles Cardiovascular: Regular rate and rhythm. Slight systolic murmur.  Abdomen:Soft. Bowel sounds present. Non-tender.  Extremities: No lower extremity edema. Pulses are 2 + in the bilateral DP/PT.  Cardiac cath 09/14/14: Ao: 136/80  LV: 182/15/28  RA: 6  RV: 50/10/16  PA: 42/28 (mean 35)  PCWP: 23  Fick Cardiac Output: 4.99 L/min  Fick Cardiac Index: 2.1 L/min/m2  Central Aortic Saturation: 97%  Pulmonary Artery Saturation: 59%  Aortic Valve Data: Peak to peak gradient 46 mmHg  Mean gradient 12 mmHg  Angiographic Findings:  Left main: No obstructive disease.  Left Anterior Descending Artery: Large caliber vessel that courses to the apex. There is a moderate caliber diagonal branch. No obstructive disease noted.  Circumflex Artery: Large caliber vessel with two small obtuse marginal branches and a moderate caliber posterolateral branch. No obstructive disease noted.  Right Coronary Artery: Large dominant vessel with 70-80% ostial stenosis. 40% proximal stenosis. Distal stented segment is patent with no restenosis.  Left Ventricular Angiogram: Deferred.    Echo 08/10/14: Left ventricle: The cavity size was normal. Wall thickness was normal. Systolic function was normal. The estimated ejection fraction was in the range of 60% to 65%. Doppler parameters are consistent with abnormal left ventricular relaxation (grade 1 diastolic dysfunction). - Aortic valve: AV is difficult to see well From right upper parasternal window peak and mean gradients through the valve are 77 and 50 mm Hg respectively consistent with severe/critical AS. This is increased from echo of 2014.  EKG:  EKG is not ordered today. The ekg ordered today demonstrates   Recent Labs: 08/06/2015: B Natriuretic Peptide 40.5 08/07/2015: Hemoglobin 11.9*; Platelets 176 08/08/2015: BUN 16; Creatinine, Ser 0.93;  Potassium 5.6*; Sodium 138   Lipid Panel    Component Value Date/Time   CHOL 191 07/06/2014 1129   TRIG 106.0 07/06/2014 1129   HDL 53.50 07/06/2014 1129   CHOLHDL 4 07/06/2014 1129   VLDL 21.2 07/06/2014 1129   LDLCALC 116* 07/06/2014 1129   LDLDIRECT 162.0 10/17/2013 0905     Wt Readings from Last 3 Encounters:  12/28/15 349 lb 6.4 oz (158.487 kg)  08/08/15 342 lb 2.5 oz (155.2 kg)  06/16/15 343 lb 3.2 oz (155.674 kg)     Other studies Reviewed: Additional studies/ records that were reviewed today include: . Review of the above records demonstrates:    Assessment and Plan:   1. Severe Aortic valve stenosis: Now s/p TAVR 12/12/14. Doing well. She is NYHA class 2. Much of her dyspnea is likely due to her morbid obesity. I have personally reviewed her echo images. Echo with normally functioning aortic valve, no AI. Will continue ASA and Plavix. Will need antibiotic prophylaxis before dental visits.   2. CAD: Cardiac cath September 2015 with moderate ostial RCA stenosis and no disease in the LAD or Circumflex. She is having no chest pain. Will continue medical management for now. Will continue ASA, Plavix, statin, beta blocker and ARB.   3. Dyspnea: Much improved but still present. I would expect her to have some dyspnea with which is likely multifactorial due to recent pneumonia, morbid obesity, deconditioning, CAD, asthma and chronic thromboembolic disease.   4. H/O Pulmonary embolism: No residual PE on CTA 02/21/13. She has been off of coumadin.   5. Hyperlipidemia: Followed in lipid clinic.  LDL 116 July 2015. Repeat now.   Current medicines are reviewed at length with the patient today.  The patient does not have concerns regarding medicines.  The following changes have been made:  no change  Labs/ tests ordered today include:  No orders of the defined types were placed in this encounter.     Disposition:   FU with me in 12  months   Signed, Lauree Chandler,  MD 12/28/2015 10:35 AM    [facility] [place], [place], [place]   [address] Phone: [phone]; Fax: [phone]

## 2015-12-28 NOTE — Patient Instructions (Signed)
Medication Instructions:  Your physician has recommended you make the following change in your medication: Decrease aspirin to 81 mg by mouth daily.    Labwork: Lab work to be done today--Lipid,liver profilese  Testing/Procedures: none  Follow-Up: Your physician wants you to follow-up in: 12 months.  You will receive a reminder letter in the mail two months in advance. If you don't receive a letter, please call our office to schedule the follow-up appointment.   Any Other Special Instructions Will Be Listed Below (If Applicable).     If you need a refill on your cardiac medications before your next appointment, please call your pharmacy.

## 2016-01-02 ENCOUNTER — Telehealth: Payer: Self-pay | Admitting: Cardiovascular Disease

## 2016-01-02 NOTE — Telephone Encounter (Signed)
New problem ° ° °Pt returning your call. Please call pt. °

## 2016-01-02 NOTE — Telephone Encounter (Signed)
Spoke with pt and reviewed lab results and recommendations from Dr. McAlhany with her.  

## 2016-01-02 NOTE — Telephone Encounter (Signed)
Left message to call back  

## 2016-01-24 ENCOUNTER — Other Ambulatory Visit: Payer: Self-pay | Admitting: Cardiovascular Disease

## 2016-01-24 MED ORDER — METOPROLOL TARTRATE 25 MG PO TABS: 25.0000 mg | ORAL_TABLET | Freq: Two times a day (BID) | ORAL | Status: DC

## 2016-02-16 ENCOUNTER — Encounter (HOSPITAL_COMMUNITY): Payer: Self-pay | Admitting: Nurse Practitioner

## 2016-02-16 ENCOUNTER — Emergency Department (HOSPITAL_COMMUNITY): Payer: Medicaid Other

## 2016-02-16 ENCOUNTER — Emergency Department (HOSPITAL_COMMUNITY)
Admission: EM | Admit: 2016-02-16 | Discharge: 2016-02-16 | Disposition: A | Discharge status: Home / Self Care | Payer: Medicaid Other | Attending: Physician Assistant | Admitting: Physician Assistant

## 2016-02-16 DIAGNOSIS — Z862 Personal history of diseases of the blood and blood-forming organs and certain disorders involving the immune mechanism: Secondary | ICD-10-CM | POA: Insufficient documentation

## 2016-02-16 DIAGNOSIS — Z9981 Dependence on supplemental oxygen: Secondary | ICD-10-CM | POA: Diagnosis not present

## 2016-02-16 DIAGNOSIS — R011 Cardiac murmur, unspecified: Secondary | ICD-10-CM | POA: Insufficient documentation

## 2016-02-16 DIAGNOSIS — Z8669 Personal history of other diseases of the nervous system and sense organs: Secondary | ICD-10-CM | POA: Diagnosis not present

## 2016-02-16 DIAGNOSIS — Z86711 Personal history of pulmonary embolism: Secondary | ICD-10-CM | POA: Insufficient documentation

## 2016-02-16 DIAGNOSIS — Z7902 Long term (current) use of antithrombotics/antiplatelets: Secondary | ICD-10-CM | POA: Insufficient documentation

## 2016-02-16 DIAGNOSIS — Z8701 Personal history of pneumonia (recurrent): Secondary | ICD-10-CM | POA: Insufficient documentation

## 2016-02-16 DIAGNOSIS — Z872 Personal history of diseases of the skin and subcutaneous tissue: Secondary | ICD-10-CM | POA: Diagnosis not present

## 2016-02-16 DIAGNOSIS — R6889 Other general symptoms and signs: Secondary | ICD-10-CM

## 2016-02-16 DIAGNOSIS — Z86018 Personal history of other benign neoplasm: Secondary | ICD-10-CM | POA: Diagnosis not present

## 2016-02-16 DIAGNOSIS — F329 Major depressive disorder, single episode, unspecified: Secondary | ICD-10-CM | POA: Insufficient documentation

## 2016-02-16 DIAGNOSIS — E119 Type 2 diabetes mellitus without complications: Secondary | ICD-10-CM | POA: Diagnosis not present

## 2016-02-16 DIAGNOSIS — E785 Hyperlipidemia, unspecified: Secondary | ICD-10-CM | POA: Diagnosis not present

## 2016-02-16 DIAGNOSIS — R600 Localized edema: Secondary | ICD-10-CM | POA: Diagnosis not present

## 2016-02-16 DIAGNOSIS — Z9889 Other specified postprocedural states: Secondary | ICD-10-CM | POA: Insufficient documentation

## 2016-02-16 DIAGNOSIS — Z87891 Personal history of nicotine dependence: Secondary | ICD-10-CM | POA: Diagnosis not present

## 2016-02-16 DIAGNOSIS — R110 Nausea: Secondary | ICD-10-CM | POA: Insufficient documentation

## 2016-02-16 DIAGNOSIS — I10 Essential (primary) hypertension: Secondary | ICD-10-CM | POA: Insufficient documentation

## 2016-02-16 DIAGNOSIS — Z7982 Long term (current) use of aspirin: Secondary | ICD-10-CM | POA: Diagnosis not present

## 2016-02-16 DIAGNOSIS — J441 Chronic obstructive pulmonary disease with (acute) exacerbation: Secondary | ICD-10-CM | POA: Diagnosis not present

## 2016-02-16 DIAGNOSIS — Z8719 Personal history of other diseases of the digestive system: Secondary | ICD-10-CM | POA: Diagnosis not present

## 2016-02-16 DIAGNOSIS — M1990 Unspecified osteoarthritis, unspecified site: Secondary | ICD-10-CM | POA: Insufficient documentation

## 2016-02-16 DIAGNOSIS — I2510 Atherosclerotic heart disease of native coronary artery without angina pectoris: Secondary | ICD-10-CM | POA: Diagnosis not present

## 2016-02-16 DIAGNOSIS — Z87442 Personal history of urinary calculi: Secondary | ICD-10-CM | POA: Insufficient documentation

## 2016-02-16 DIAGNOSIS — R42 Dizziness and giddiness: Secondary | ICD-10-CM | POA: Insufficient documentation

## 2016-02-16 DIAGNOSIS — I5032 Chronic diastolic (congestive) heart failure: Secondary | ICD-10-CM | POA: Diagnosis not present

## 2016-02-16 DIAGNOSIS — Z79899 Other long term (current) drug therapy: Secondary | ICD-10-CM | POA: Diagnosis not present

## 2016-02-16 DIAGNOSIS — I252 Old myocardial infarction: Secondary | ICD-10-CM | POA: Insufficient documentation

## 2016-02-16 DIAGNOSIS — Z9861 Coronary angioplasty status: Secondary | ICD-10-CM | POA: Insufficient documentation

## 2016-02-16 DIAGNOSIS — R197 Diarrhea, unspecified: Secondary | ICD-10-CM | POA: Diagnosis present

## 2016-02-16 DIAGNOSIS — Z7984 Long term (current) use of oral hypoglycemic drugs: Secondary | ICD-10-CM | POA: Diagnosis not present

## 2016-02-16 LAB — BASIC METABOLIC PANEL
ANION GAP: 14 (ref 5–15)
BUN: 6 mg/dL (ref 6–20)
CO2: 26 mmol/L (ref 22–32)
Calcium: 8.7 mg/dL — ABNORMAL LOW (ref 8.9–10.3)
Chloride: 98 mmol/L — ABNORMAL LOW (ref 101–111)
Creatinine, Ser: 0.96 mg/dL (ref 0.44–1.00)
Glucose, Bld: 186 mg/dL — ABNORMAL HIGH (ref 65–99)
Potassium: 3.7 mmol/L (ref 3.5–5.1)
SODIUM: 138 mmol/L (ref 135–145)

## 2016-02-16 LAB — CBC
HCT: 37 % (ref 36.0–46.0)
Hemoglobin: 11.6 g/dL — ABNORMAL LOW (ref 12.0–15.0)
MCH: 26.5 pg (ref 26.0–34.0)
MCHC: 31.4 g/dL (ref 30.0–36.0)
MCV: 84.5 fL (ref 78.0–100.0)
PLATELETS: 132 10*3/uL — AB (ref 150–400)
RBC: 4.38 MIL/uL (ref 3.87–5.11)
RDW: 14.6 % (ref 11.5–15.5)
WBC: 8.7 10*3/uL (ref 4.0–10.5)

## 2016-02-16 LAB — I-STAT TROPONIN, ED: TROPONIN I, POC: 0.01 ng/mL (ref 0.00–0.08)

## 2016-02-16 LAB — I-STAT CG4 LACTIC ACID, ED: LACTIC ACID, VENOUS: 1.6 mmol/L (ref 0.5–2.0)

## 2016-02-16 IMAGING — CR DG CHEST 2V
2 series · 2 of 2 positions shown · non-contrast
Comparison: PA and lateral chest [DATE] and [DATE]. CT
chest [DATE].

CLINICAL DATA: Fever, cough, body aches and decreased appetite for
1 week. Initial encounter.

EXAM:
CHEST  2 VIEW

[chest pa]
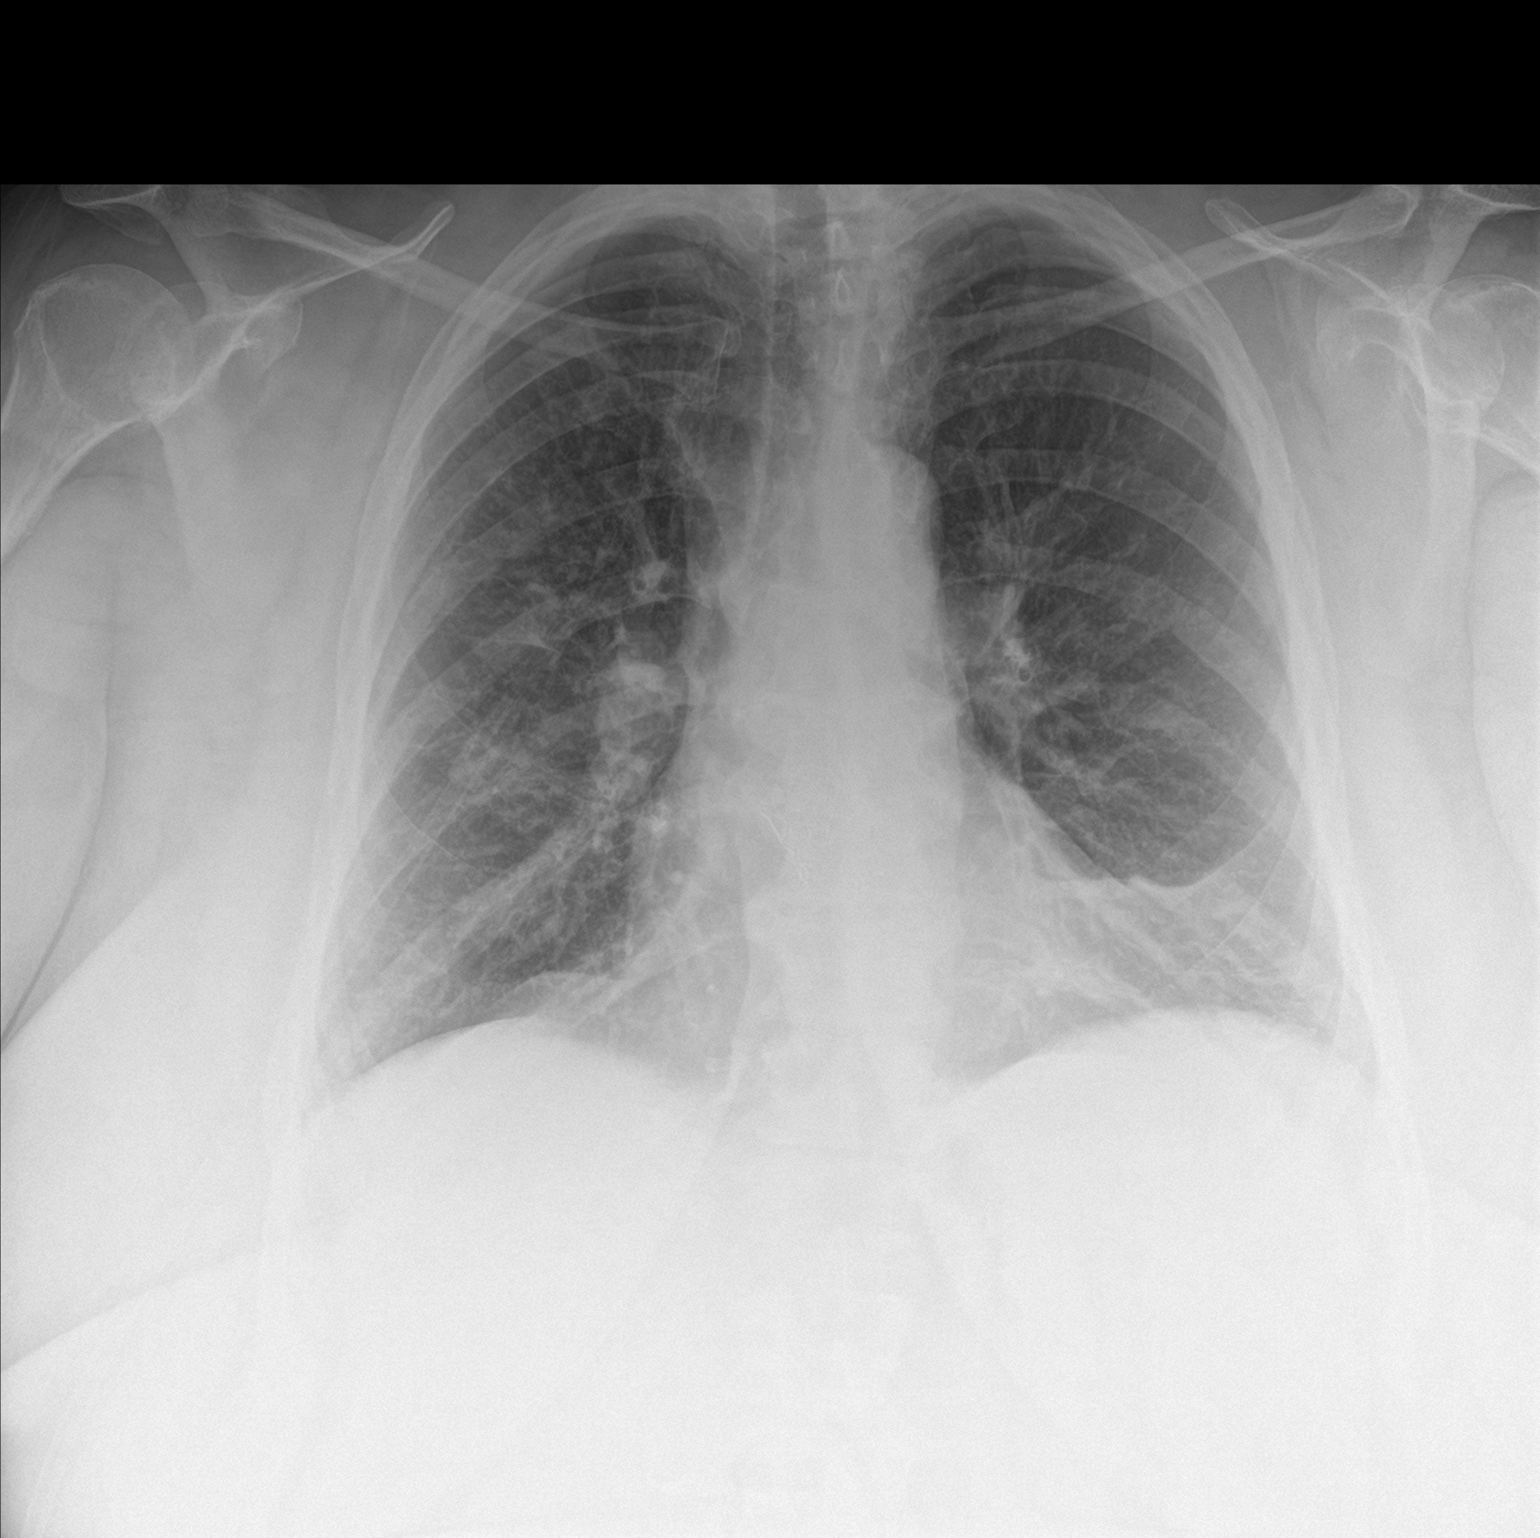

[chest lat]
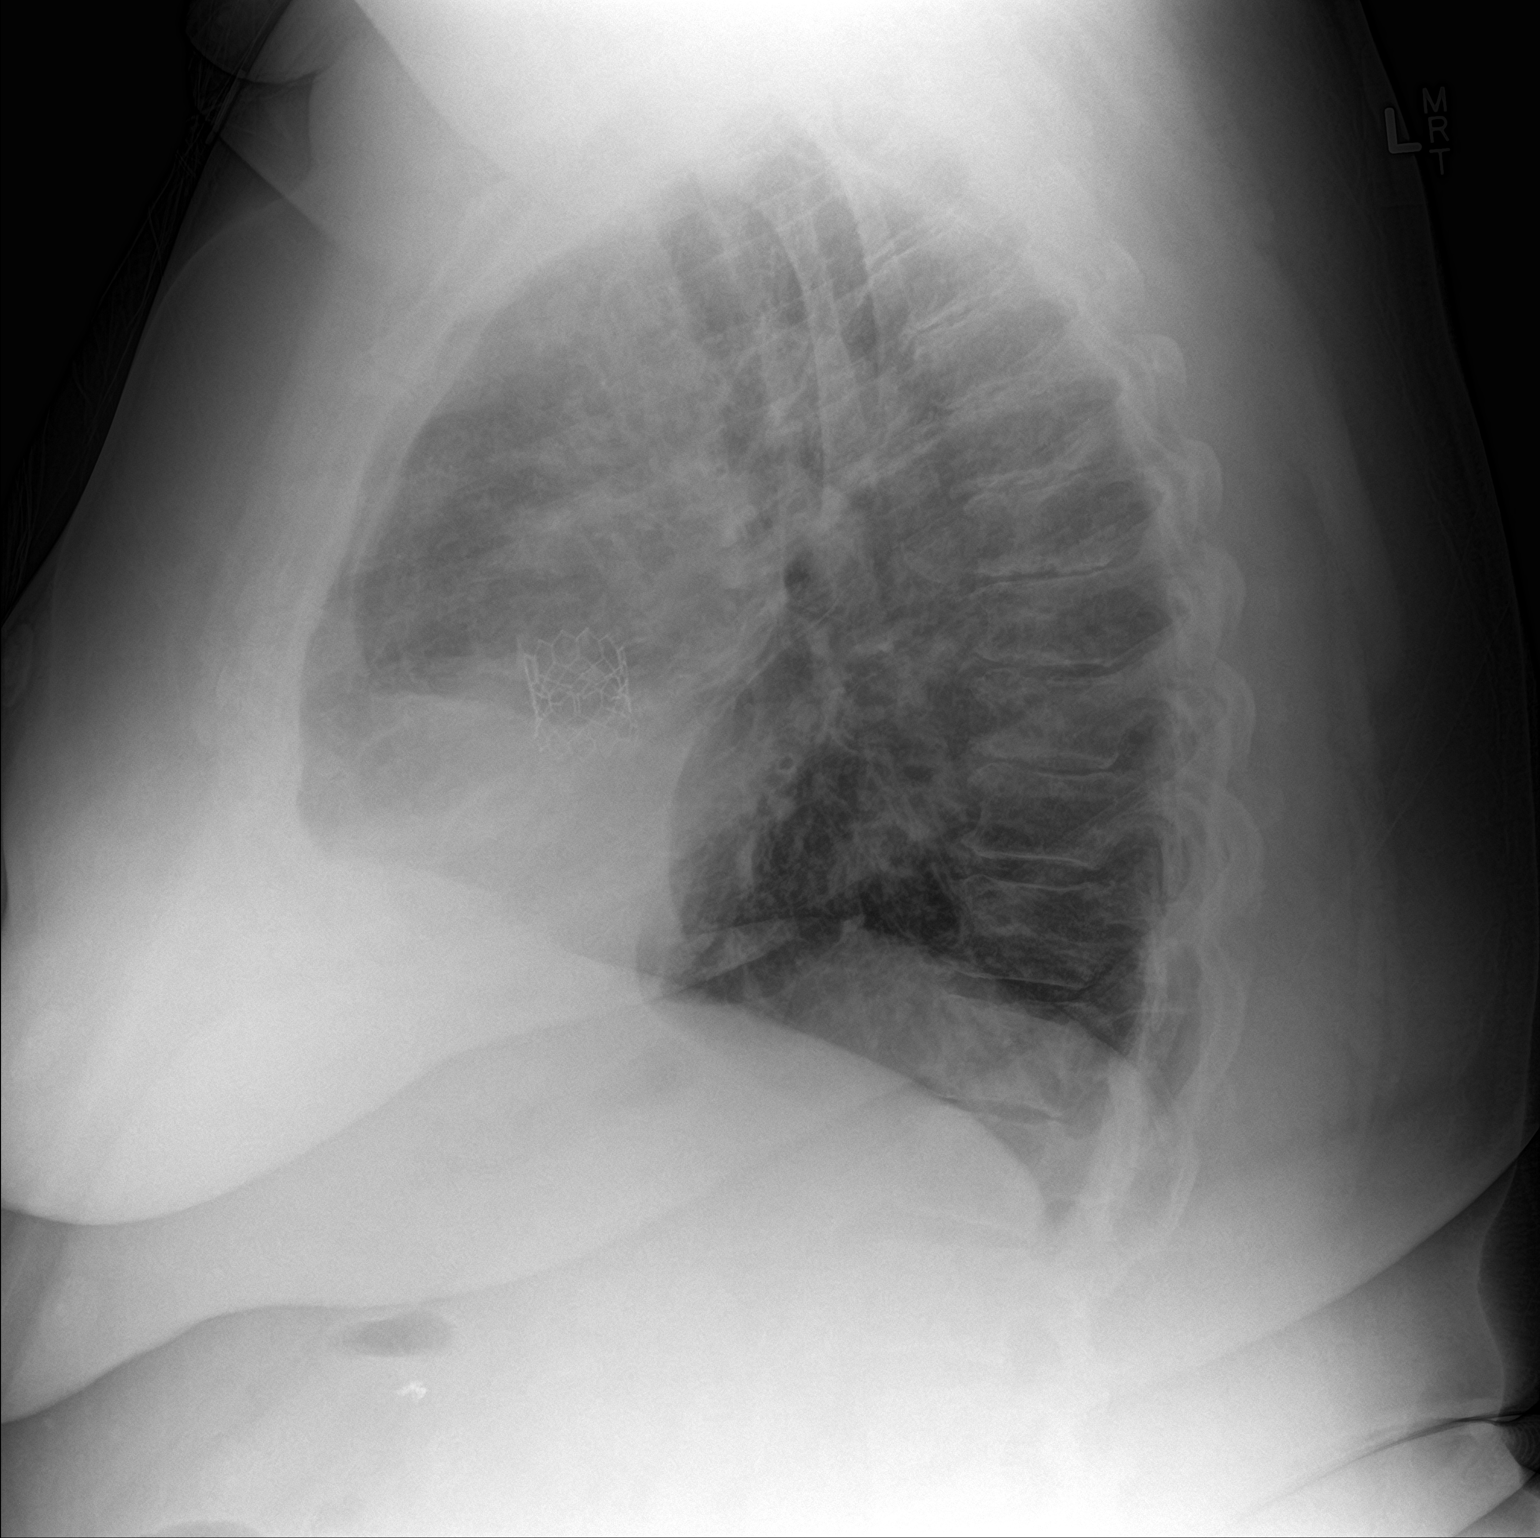

[2 of 2 positions shown; findings below may reference images not displayed]

FINDINGS: Pulmonary interstitium is somewhat prominent. Prominent epicardial
fat on the left is unchanged. No consolidative process, pneumothorax
or effusion. Heart size is normal. The patient is status post aortic
valve repair.
IMPRESSION: No acute disease.

## 2016-02-16 MED ORDER — BENZONATATE 100 MG PO CAPS: 100.0000 mg | ORAL_CAPSULE | Freq: Three times a day (TID) | ORAL | Status: DC | PRN

## 2016-02-16 MED ORDER — AZITHROMYCIN 250 MG PO TABS: 250.0000 mg | ORAL_TABLET | Freq: Once | ORAL | Status: DC

## 2016-02-16 MED ORDER — IPRATROPIUM-ALBUTEROL 0.5-2.5 (3) MG/3ML IN SOLN
3.0000 mL | Freq: Once | RESPIRATORY_TRACT | Status: AC
  Administered 2016-02-16: 3 mL via RESPIRATORY_TRACT
  Filled 2016-02-16: qty 3

## 2016-02-16 MED ORDER — IBUPROFEN 800 MG PO TABS
800.0000 mg | ORAL_TABLET | Freq: Once | ORAL | Status: AC
  Administered 2016-02-16: 800 mg via ORAL
  Filled 2016-02-16: qty 1

## 2016-02-16 MED ORDER — PREDNISONE 20 MG PO TABS
60.0000 mg | ORAL_TABLET | Freq: Once | ORAL | Status: AC
  Administered 2016-02-16: 60 mg via ORAL
  Filled 2016-02-16 (×2): qty 3

## 2016-02-16 MED ORDER — AZITHROMYCIN 250 MG PO TABS
500.0000 mg | ORAL_TABLET | Freq: Once | ORAL | Status: AC
  Administered 2016-02-16: 500 mg via ORAL
  Filled 2016-02-16: qty 2

## 2016-02-16 MED ORDER — ONDANSETRON HCL 4 MG/2ML IJ SOLN
4.0000 mg | Freq: Once | INTRAMUSCULAR | Status: AC
  Administered 2016-02-16: 4 mg via INTRAVENOUS
  Filled 2016-02-16: qty 2

## 2016-02-16 MED ORDER — PREDNISONE 20 MG PO TABS: ORAL_TABLET | ORAL | Status: DC

## 2016-02-16 MED ORDER — SODIUM CHLORIDE 0.9 % IV BOLUS (SEPSIS)
1000.0000 mL | Freq: Once | INTRAVENOUS | Status: AC
  Administered 2016-02-16: 1000 mL via INTRAVENOUS

## 2016-02-16 MED ORDER — ACETAMINOPHEN 325 MG PO TABS: 650.0000 mg | ORAL_TABLET | Freq: Once | ORAL | Status: DC

## 2016-02-16 NOTE — ED Notes (Addendum)
Pt c/o 3 day history chest congestion, cough, fevers, feeling SOB. Tried mucinex with minimal relief. Denies any pain. She is A&Ox4, breathing mildly labored at rest in triage. She is on continuous O2  3L AT HOME

## 2016-02-16 NOTE — ED Notes (Signed)
Patient transported to X-ray 

## 2016-02-16 NOTE — ED Notes (Signed)
Ems administered acetaminophen 1000mg  just pta, will continue to monitor temperature while waiting for exam room

## 2016-02-16 NOTE — Discharge Instructions (Signed)
We think he may have a virus, or the flu. You will need to take azithromycin and prednisone to help with your lung issues. Please use oxygen at home as needed. Please wash hands and follow up with your PCP. Return with any concerns.

## 2016-02-16 NOTE — ED Provider Notes (Signed)
CSN: [id number]     Arrival date & time 02/16/16  1318 History   First MD Initiated Contact with Patient 02/16/16 1517     Chief Complaint  Patient presents with  . Cough     (Consider location/radiation/quality/duration/timing/severity/associated sxs/prior Treatment) HPI   Patient is a 55 year old female with morbid obesity, depression, hypertension, hyperlipidemia, history of MI and PE presenting with fever and flulike symptoms for the last 2-3 days. Patient has history of COPD with multiple treatment in the emergency department. She's had mild fever for the last 2-3 days associated with cough, congestion, sore throat, occasional nausea, occasional diarrhea. She reports taking adequate by mouth.  She reports feeling more fatigued than usual. Mild increase in sputum. Her complaint is cough and fever.  Past Medical History  Diagnosis Date  . Asthma   . Hypertension   . Hyperlipidemia   . GERD (gastroesophageal reflux disease)   . Depression   . Blood transfusion     2013[facility]  . Headache(784.0)     h/o migraines  . Pulmonary embolism ([place]) 02/08/12    S/P RT TOTAL KNEE ON 02/03/12--ON 02/08/12--DEVELOPED ACUTE SOB AND CHEST PAIN--AND DIAGNOSED WITH  PULMONARY EMBOLUS AND PNEUMONIA  . Restless leg syndrome   . Myocardial infarction ([place])     PT THINKS SHE WAS DX WITH MI AT THE TIME OF HEART STENTING  . CAD (coronary artery disease)     Cath 2010 with DES x 1 RCA-- PT'S CARDIOLOGIST IS DR. Sterling  . Uterine fibroid     NO PROBLEMS AT PRESENT FROM THE FIBROIDS-STATES SHE IS POST MENOPAUSAL-LAST MENSES 2010 EXCEPT FOR EPISODE THIS YR OF BLEEDING RELATED TO FIBROIDS.  Marland Kitchen Heart murmur   . Pneumonia 02/08/12    HOSPITALIZED AT [facility] WITH PNEUMONIA AND WITH PULMONARY EMBOLUS  . Diabetes mellitus DIAGNOSED IN2010    ORAL MEDS  . Arthritis     PAIN AND SEVERE OA LEFT KNEE ; S/P RIGHT TKA ON 02/03/12; HAS LOWER BACK PAIN-UNABLE TO STAND MORE THAN 10 MIN; ARTHRITIS "ALL OVER"  . Neck pain    . Weakness     BOTH HANDS - S/P BILATERAL CARPAL TUNNEL RELEASE--BUT STILL HAS WEAKNESS--OFTEN DROPS THINGS  . Shortness of breath     with exertion  . Eczema     on back  . Kidney stones   . Anemia   . COPD (chronic obstructive pulmonary disease) ([place])   . Sleep apnea     uses 3 liters O2 at night   . Aortic valve stenosis, severe   . Aortic stenosis   . Morbid obesity with body mass index of 60.0-69.9 in adult [facility])   . Chronic diastolic congestive heart failure [facility])    Past Surgical History  Procedure Laterality Date  . Tubal ligation    . Carpal tunnel release      Bilateral  . Cholecystectomy    . Hernia repair    . Cardiac catheterization    . Knee arthroplasty  02/03/2012    Procedure: COMPUTER ASSISTED TOTAL KNEE ARTHROPLASTY;  Surgeon: Mcarthur Rossetti, MD;  Location: [place];  Service: Orthopedics;  Laterality: Right;  Right total knee arthroplasty  . Coronary angioplasty      2010 has stent in place  . Total knee arthroplasty  09/10/2012    Procedure: TOTAL KNEE ARTHROPLASTY;  Surgeon: Mcarthur Rossetti, MD;  Location: [facility];  Service: Orthopedics;  Laterality: Left;  . Trigger finger release  09/10/2012    Procedure:  RELEASE TRIGGER FINGER/A-1 PULLEY;  Surgeon: Mcarthur Rossetti, MD;  Location: [facility];  Service: Orthopedics;  Laterality: Right;  Right Ring Finger  . Tee without cardioversion N/A 03/14/2013    Procedure: TRANSESOPHAGEAL ECHOCARDIOGRAM (TEE);  Surgeon: Lelon Perla, MD;  Location: [facility] ENDOSCOPY;  Service: Cardiovascular;  Laterality: N/A;  . Joint replacement      bil total knees  . Total knee revision Right 07/15/2013    Procedure: REVISION ARTHROPLASTY RIGHT KNEE;  Surgeon: Mcarthur Rossetti, MD;  Location: [facility];  Service: Orthopedics;  Laterality: Right;  . Cystoscopy w/ retrogrades Right 09/21/2013    Procedure: CYSTOSCOPY WITH RIGHT RETROGRADE PYELOGRAM RIGHT DOUBLE J STENT ;  Surgeon: Fredricka Bonine, MD;  Location:  [facility];  Service: Urology;  Laterality: Right;  . Cystoscopy with ureteroscopy and stent placement Right 10/25/2013    Procedure: CYSTOSCOPY RIGHT URETEROSCOPY HOLMIUM LASER LITHO AND STENT PLACEMENT;  Surgeon: Fredricka Bonine, MD;  Location: [facility];  Service: Urology;  Laterality: Right;  . Tee without cardioversion N/A 11/14/2014    Procedure: TRANSESOPHAGEAL ECHOCARDIOGRAM (TEE);  Surgeon: Lelon Perla, MD;  Location: [facility] ENDOSCOPY;  Service: Cardiovascular;  Laterality: N/A;  . Left and right heart catheterization with coronary angiogram N/A 03/17/2013    Procedure: LEFT AND RIGHT HEART CATHETERIZATION WITH CORONARY ANGIOGRAM;  Surgeon: Burnell Blanks, MD;  Location: [facility] CATH LAB;  Service: Cardiovascular;  Laterality: N/A;  . Left and right heart catheterization with coronary/graft angiogram N/A 09/14/2014    Procedure: LEFT AND RIGHT HEART CATHETERIZATION WITH Beatrix Fetters;  Surgeon: Burnell Blanks, MD;  Location: [facility] CATH LAB;  Service: Cardiovascular;  Laterality: N/A;  . Transcatheter aortic valve replacement, transfemoral N/A 12/12/2014    Procedure: TRANSCATHETER AORTIC VALVE REPLACEMENT, TRANSFEMORAL;  Surgeon: Burnell Blanks, MD;  Location: [place];  Service: Cardiovascular;  Laterality: N/A;  . Intraoperative transesophageal echocardiogram N/A 12/12/2014    Procedure: INTRAOPERATIVE TRANSESOPHAGEAL ECHOCARDIOGRAM;  Surgeon: Burnell Blanks, MD;  Location: [facility] OR;  Service: Cardiovascular;  Laterality: N/A;   Family History  Problem Relation Age of Onset  . Breast cancer Mother   . COPD Father     smoked  . Cancer Brother     Sinus  . Emphysema Mother     smoked  . Asthma Father   . Heart disease Mother   . Heart disease Father    Social History  Substance Use Topics  . Smoking status: Former Smoker -- 1.50 packs/day for 30 years    Types: Cigarettes    Quit date: 12/29/2000  . Smokeless tobacco: Never Used  . Alcohol Use:  0.6 oz/week    1 Cans of beer per week     Comment: rarely   OB History    No data available     Review of Systems  Constitutional: Positive for fever and fatigue. Negative for activity change.  HENT: Positive for congestion.   Respiratory: Positive for cough and shortness of breath.   Cardiovascular: Negative for chest pain.  Gastrointestinal: Positive for nausea and diarrhea. Negative for abdominal pain.  Genitourinary: Negative for dysuria.  Musculoskeletal: Negative for back pain.  Neurological: Positive for light-headedness. Negative for dizziness.  Psychiatric/Behavioral: Negative for agitation.      Allergies  Review of patient's allergies indicates no known allergies.  Home Medications   Prior to Admission medications   Medication Sig Start Date End Date Taking? Authorizing Provider  acetaminophen (TYLENOL) 160 MG/5ML solution Take 320 mg by mouth  every 6 (six) hours as needed for moderate pain.   Yes Historical Provider, MD  albuterol (PROVENTIL HFA;VENTOLIN HFA) 108 (90 BASE) MCG/ACT inhaler Inhale 2 puffs into the lungs every 4 (four) hours as needed for wheezing or shortness of breath.    Yes Historical Provider, MD  albuterol (PROVENTIL) (2.5 MG/3ML) 0.083% nebulizer solution Take 3 mLs (2.5 mg total) by nebulization every 4 (four) hours as needed for wheezing or shortness of breath. 08/08/15  Yes Janece Canterbury, MD  aspirin EC 81 MG tablet Take 1 tablet (81 mg total) by mouth daily. 12/28/15  Yes Burnell Blanks, MD  atorvastatin (LIPITOR) 80 MG tablet Take 80 mg by mouth daily.   Yes Historical Provider, MD  citalopram (CELEXA) 20 MG tablet Take 20 mg by mouth daily.   Yes Historical Provider, MD  clopidogrel (PLAVIX) 75 MG tablet Take 75 mg by mouth every morning.    Yes Historical Provider, MD  ezetimibe (ZETIA) 10 MG tablet Take 1 tablet (10 mg total) by mouth daily. 12/28/15  Yes Burnell Blanks, MD  furosemide (LASIX) 40 MG tablet Take 40 mg by  mouth 2 (two) times daily.   Yes Historical Provider, MD  gabapentin (NEURONTIN) 300 MG capsule Take 900 mg by mouth at bedtime.    Yes Historical Provider, MD  glipiZIDE (GLUCOTROL) 5 MG tablet Take 2 tablets (10 mg total) by mouth daily before supper. 08/08/15  Yes Janece Canterbury, MD  latanoprost (XALATAN) 0.005 % ophthalmic solution Place 1 drop into both eyes at bedtime.   Yes Historical Provider, MD  losartan (COZAAR) 100 MG tablet Take 100 mg by mouth daily.   Yes Historical Provider, MD  metoprolol tartrate (LOPRESSOR) 25 MG tablet Take 1 tablet (25 mg total) by mouth 2 (two) times daily. 01/24/16  Yes Burnell Blanks, MD  OXYGEN-HELIUM IN Inhale 3 L into the lungs daily.    Yes Historical Provider, MD  potassium chloride SA (K-DUR,KLOR-CON) 20 MEQ tablet Take 40 mEq by mouth at bedtime.   Yes Historical Provider, MD  azithromycin (ZITHROMAX Z-PAK) 250 MG tablet Take 1 tablet (250 mg total) by mouth once. 02/16/16   Tryniti Laatsch Lyn Dontavis Tschantz, MD  benzonatate (TESSALON PERLES) 100 MG capsule Take 1 capsule (100 mg total) by mouth 3 (three) times daily as needed for cough. 02/16/16   Kyrstan Gotwalt Lyn Taneika Choi, MD  ciprofloxacin (CILOXAN) 0.3 % ophthalmic solution Place 2 drops into both eyes every 4 (four) hours while awake. Administer 1 drop, every 2 hours, while awake, for 2 days. Then 1 drop, every 4 hours, while awake, for the next 5 days. 08/08/15   Janece Canterbury, MD  guaiFENesin-codeine 100-10 MG/5ML syrup Take 5 mLs by mouth every 4 (four) hours as needed for cough. 08/08/15   Janece Canterbury, MD  losartan (COZAAR) 50 MG tablet Take 1 tablet (50 mg total) by mouth daily with breakfast. 12/16/14   Donielle Liston Alba, PA-C  predniSONE (DELTASONE) 20 MG tablet Day 1 and 2: Take 3 tabs  Day 3-5: Take 2 tabs.  Day 5-8: take 1 tab 02/16/16   Zhane Bluitt Lyn Dasiah Hooley, MD   BP 114/49 mmHg  Pulse 102  Temp(Src) 98.9 F (37.2 C) (Oral)  Resp 20  SpO2 95%  LMP 07/26/2009 Physical Exam   Constitutional: She is oriented to person, place, and time. She appears well-developed and well-nourished.  Morbid obesity.  HENT:  Head: Normocephalic and atraumatic.  Eyes: Conjunctivae are normal. Right eye exhibits no discharge.  Neck: Neck supple.  Cardiovascular: Normal rate, regular rhythm and normal heart sounds.   No murmur heard. Pulmonary/Chest: Effort normal. She has wheezes. She has no rales.  scattered wheezes. On home 3 L of oxygen.  Abdominal: Soft. She exhibits no distension. There is no tenderness.  Musculoskeletal: Normal range of motion. She exhibits edema.  Neurological: She is oriented to person, place, and time. No cranial nerve deficit.  Skin: Skin is warm and dry. No rash noted. She is not diaphoretic.  Psychiatric: Her behavior is normal.  Nursing note and vitals reviewed.   ED Course  Procedures (including critical care time) Labs Review Labs Reviewed  CBC - Abnormal; Notable for the following:    Hemoglobin 11.6 (*)    Platelets 132 (*)    All other components within normal limits  BASIC METABOLIC PANEL - Abnormal; Notable for the following:    Chloride 98 (*)    Glucose, Bld 186 (*)    Calcium 8.7 (*)    All other components within normal limits  I-STAT TROPOININ, ED  I-STAT CG4 LACTIC ACID, ED    Imaging Review Dg Chest 2 View  02/16/2016  CLINICAL DATA:  Fever, cough, body aches and decreased appetite for 1 week. Initial encounter. EXAM: CHEST  2 VIEW COMPARISON:  PA and lateral chest 08/06/2015 and 12/08/2014. CT chest 09/01/2013. FINDINGS: Pulmonary interstitium is somewhat prominent. Prominent epicardial fat on the left is unchanged. No consolidative process, pneumothorax or effusion. Heart size is normal. The patient is status post aortic valve repair. IMPRESSION: No acute disease. Electronically Signed   By: Inge Rise M.D.   On: 02/16/2016 16:00   I have personally reviewed and evaluated these images and lab results as part of my  medical decision-making.   EKG Interpretation   Date/Time:  Saturday February 16 2016 13:31:45 EST Ventricular Rate:  130 PR Interval:  162 QRS Duration: 78 QT Interval:  282 QTC Calculation: 415 R Axis:   51 Text Interpretation:  Sinus tachycardia Otherwise normal ECG Sinus  tachycardia No significant change since last tracing Confirmed by Gerald Leitz ([id number]) on 02/16/2016 4:00:05 PM      MDM   Final diagnoses:  Flu-like symptoms   patient is a 55 year old female with history of orbit obesity, on home 3 L, history of COPD, PE and MI presenting today with 3-4 days of viral-like illness. Patient's had fevers and cough. We will get chest x-ray to make sure patient does not have pneumonia requiring antibiotics. Otherwise we'll treat for COPD exacerbation and give her medications to help with flulike symptoms at home. Currently she is not requiring any additional oxygen than her regular home oxygen.  5:54 PM Patient has all labs within normal limits. She has vital signs showing only mild tachycardia likely from mild fever, mild dehydration. However she's taking by mouth normally. No nausea no vomiting. She is using her home oxygen. The patient is safe to discharge home given that she is at her baseline in terms of vital signs and labs. We'll treat her for COPD exacerbation. Suspect a viral illness exacerbating her COPD today.  Kema Santaella Julio Alm, MD 02/16/16 1754

## 2016-05-16 ENCOUNTER — Encounter: Payer: Self-pay | Admitting: Cardiovascular Disease

## 2016-05-16 ENCOUNTER — Telehealth: Payer: Self-pay | Admitting: Cardiovascular Disease

## 2016-05-16 NOTE — Telephone Encounter (Signed)
Letter in chart. cdm 

## 2016-05-16 NOTE — Telephone Encounter (Signed)
I spoke with pt and gave her information from Dr Angelena Form.  She is seeing primary care soon to discuss. Pt is asking if Dr. Angelena Form would write a letter indicating how gastric bypass surgery would be beneficial for her from a cardiac standpoint.  Letter is for medicaid.  I told pt letter could be written. She requests this be mailed to her and she will give to insurance.

## 2016-05-16 NOTE — Telephone Encounter (Signed)
Letter mailed to pt.  

## 2016-05-16 NOTE — Telephone Encounter (Signed)
New message      Calling to see if Dr Angelena Form would refer her to have gastric bypass surgery and if he thinks it would benefit her?

## 2016-05-16 NOTE — Telephone Encounter (Signed)
I think she would absolutely benefit from this. I would ask her to discuss with her primary care doctor though. Gerald Stabs

## 2016-06-10 ENCOUNTER — Ambulatory Visit
Admission: RE | Admit: 2016-06-10 | Discharge: 2016-06-10 | Disposition: A | Discharge status: Home / Self Care | Payer: Medicaid Other | Source: Ambulatory Visit | Attending: Family Medicine | Admitting: Family Medicine

## 2016-06-10 ENCOUNTER — Other Ambulatory Visit: Payer: Self-pay | Admitting: Family Medicine

## 2016-06-10 DIAGNOSIS — R52 Pain, unspecified: Secondary | ICD-10-CM

## 2016-06-10 IMAGING — CR DG HIP (WITH OR WITHOUT PELVIS) 2-3V*L*
2 series · 2 of 2 positions shown · non-contrast
Comparison: None.

CLINICAL DATA: Chronic left hip pain, no known injury, initial
encounter

EXAM:
DG HIP (WITH OR WITHOUT PELVIS) 2-3V LEFT

[t hip ap left]
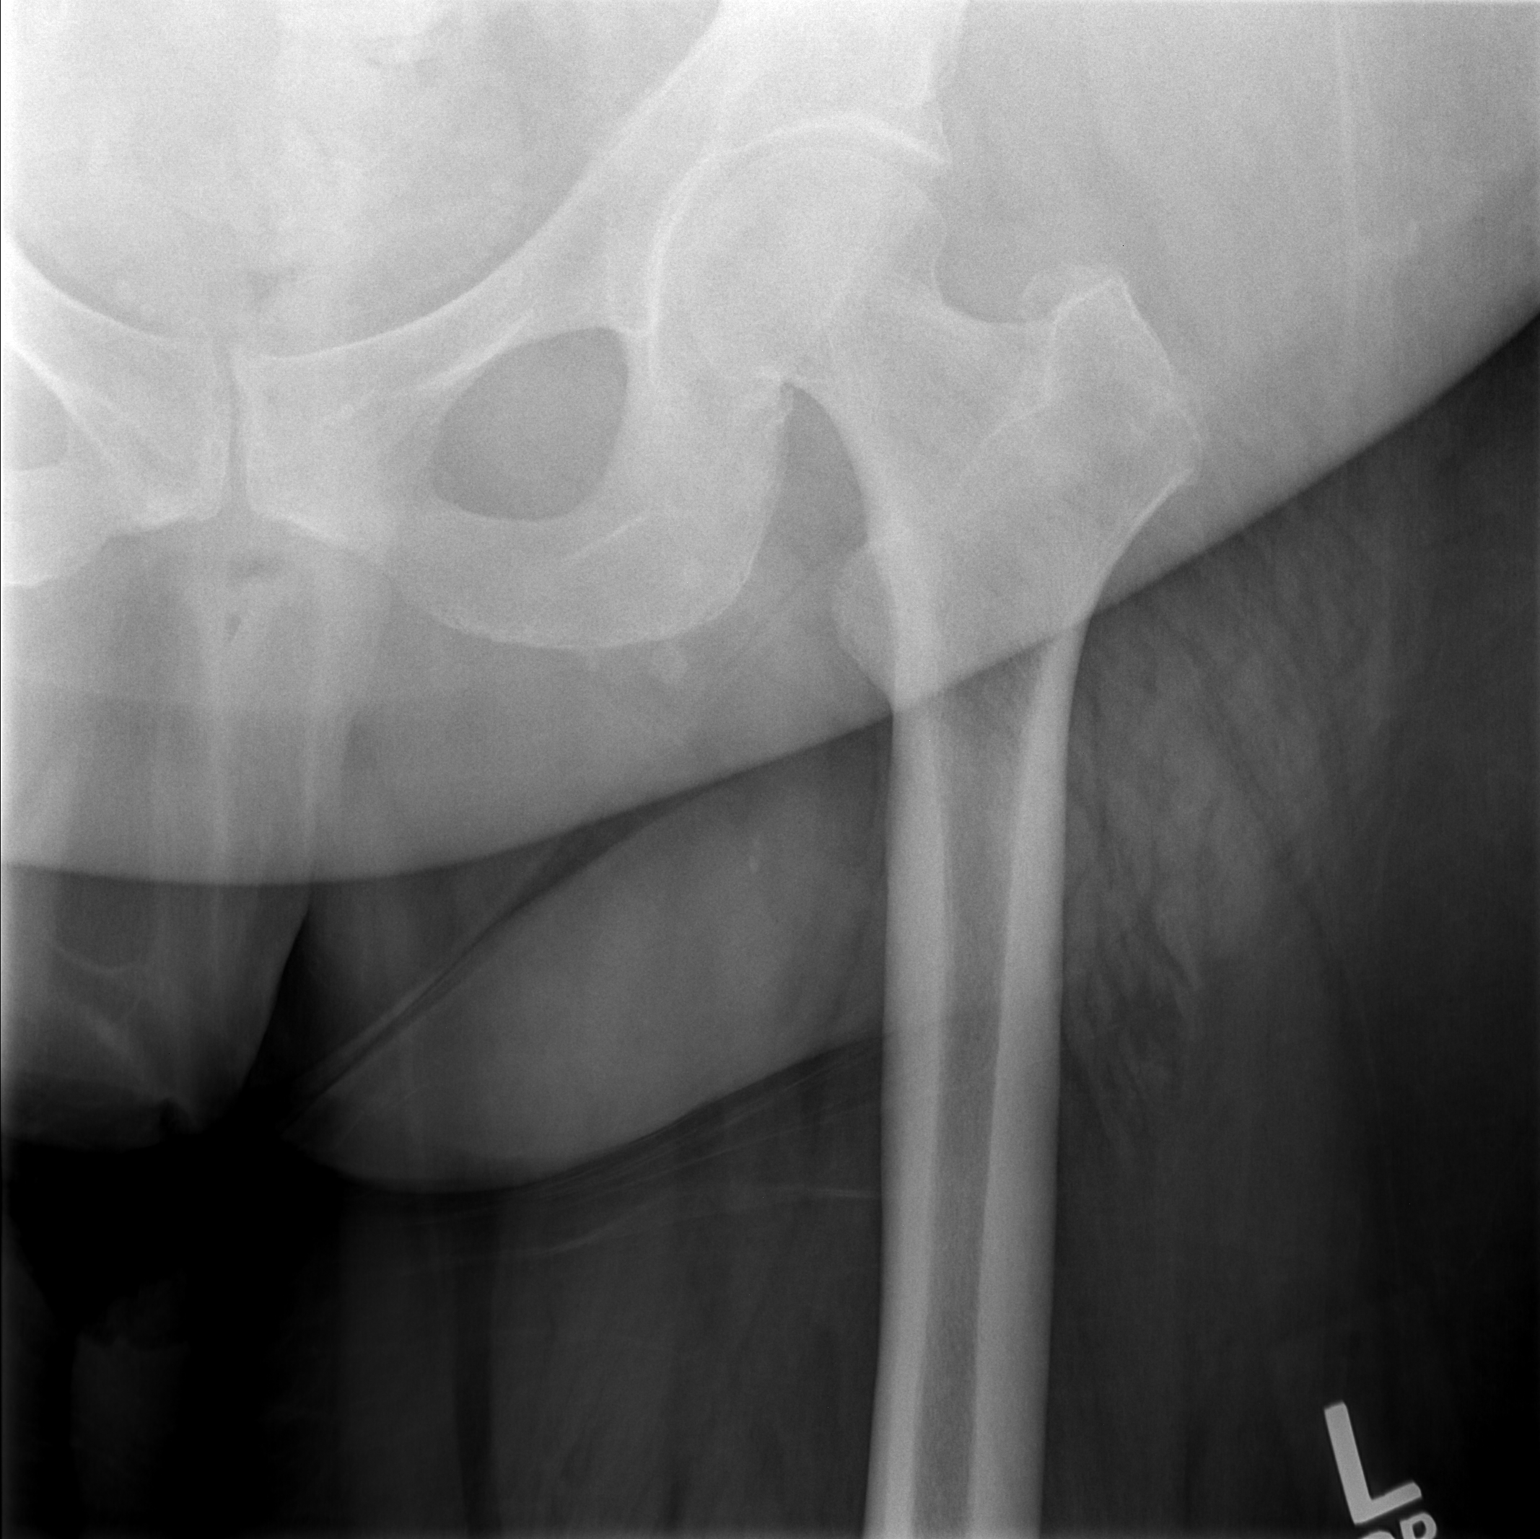

[t hip frog leg left]
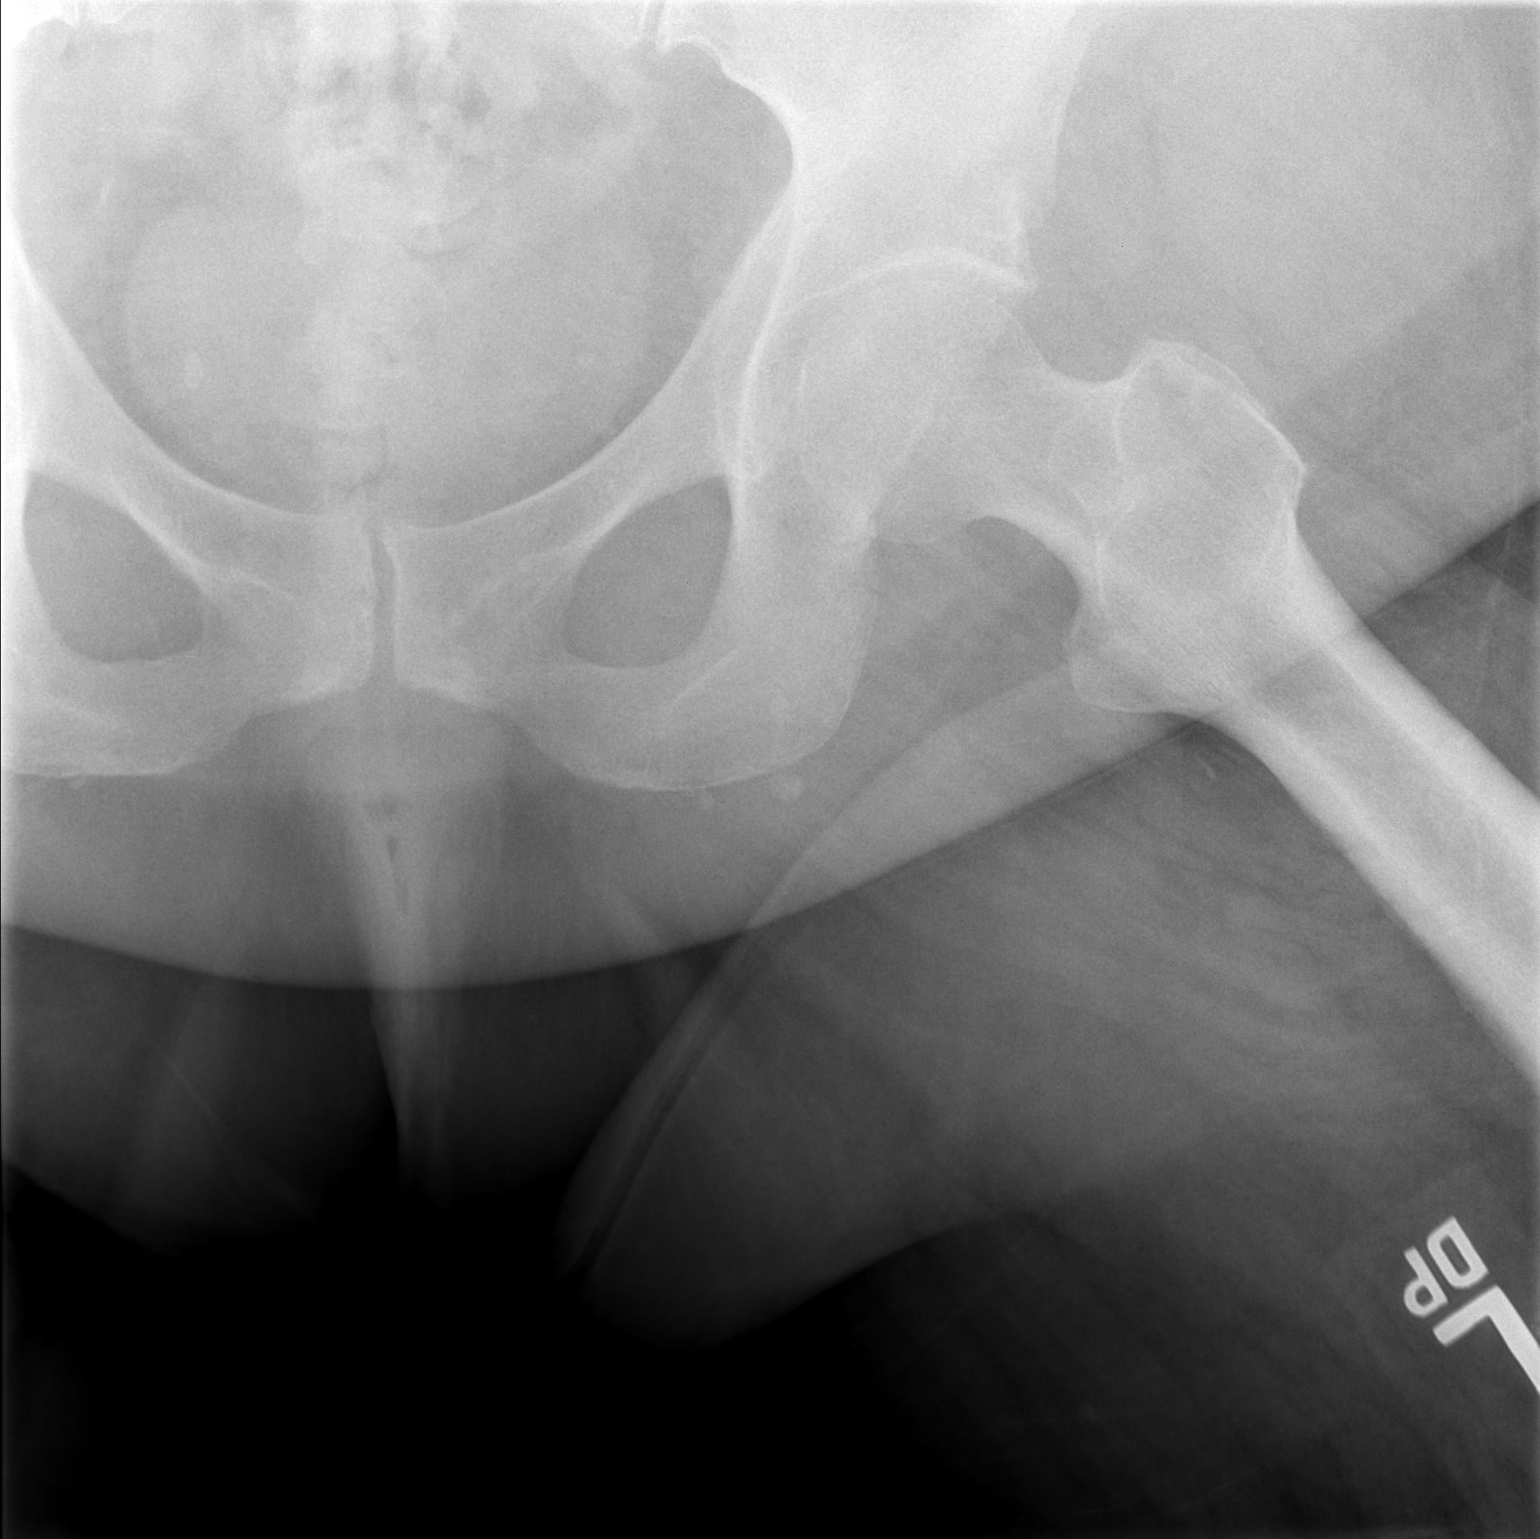

[2 of 2 positions shown; findings below may reference images not displayed]

FINDINGS: No acute fracture dislocation is noted. Very mild degenerative
changes are seen. No soft tissue abnormality is noted.
IMPRESSION: Mild degenerative changes without acute abnormality.

## 2016-08-20 ENCOUNTER — Other Ambulatory Visit: Payer: Self-pay | Admitting: *Deleted

## 2016-08-20 DIAGNOSIS — N950 Postmenopausal bleeding: Secondary | ICD-10-CM

## 2016-08-21 ENCOUNTER — Encounter: Payer: Self-pay | Admitting: *Deleted

## 2016-08-25 ENCOUNTER — Ambulatory Visit (HOSPITAL_COMMUNITY)
Admission: RE | Admit: 2016-08-25 | Discharge: 2016-08-25 | Disposition: A | Discharge status: Home / Self Care | Payer: Medicaid Other | Source: Ambulatory Visit | Attending: Obstetrics & Gynecology | Admitting: Obstetrics & Gynecology

## 2016-08-25 DIAGNOSIS — N950 Postmenopausal bleeding: Secondary | ICD-10-CM

## 2016-08-25 DIAGNOSIS — R938 Abnormal findings on diagnostic imaging of other specified body structures: Secondary | ICD-10-CM | POA: Diagnosis not present

## 2016-08-25 IMAGING — US US PELVIS COMPLETE
1 series · 15 of 25 positions shown · non-contrast
Comparison: Abdominal and pelvic CT scan [DATE]

CLINICAL DATA: Long history of intermittent vaginal spotting,
history of fibroids, tubal ligation

EXAM:
TRANSABDOMINAL AND TRANSVAGINAL ULTRASOUND OF PELVIS
TECHNIQUE: Both transabdominal and transvaginal ultrasound examinations of the
pelvis were performed. Transabdominal technique was performed for
global imaging of the pelvis including uterus, ovaries, adnexal
regions, and pelvic cul-de-sac. It was necessary to proceed with
endovaginal exam following the transabdominal exam to visualize the
uterus, endometrium, ovaries, and adnexal regions..

[Series 1: us pelvis complete · 15 of 46 slices shown]
[im 1/46]
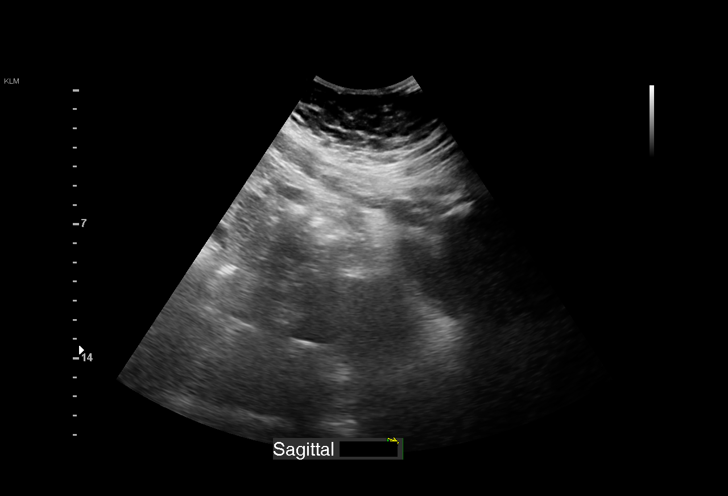
[im 4/46]
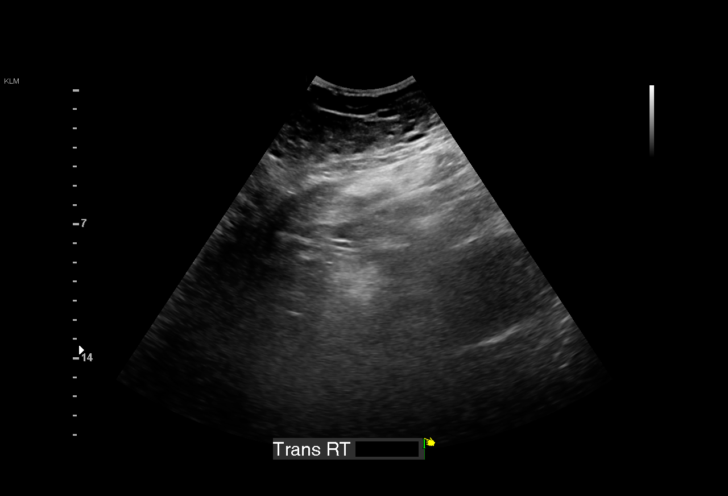
[im 8/46]
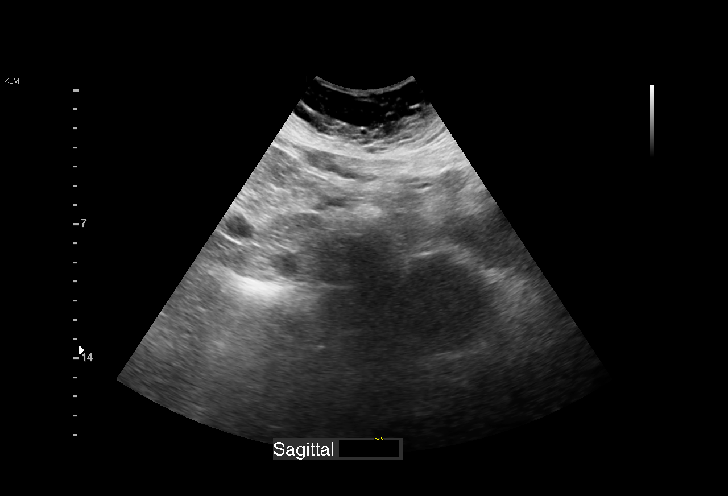
[im 10/46]
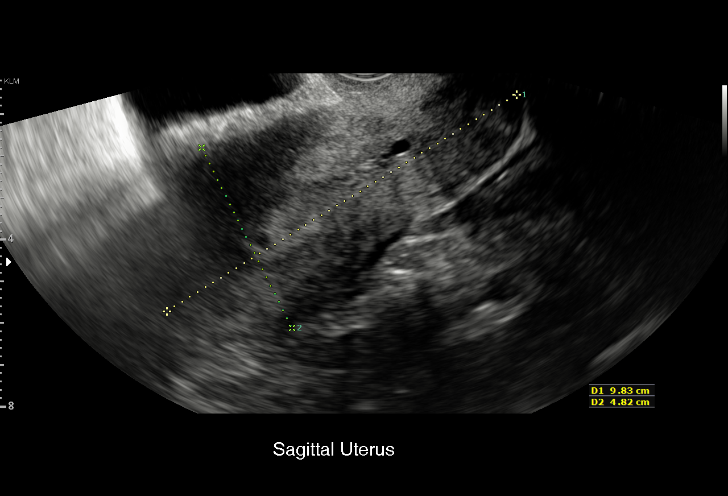
[im 14/46]
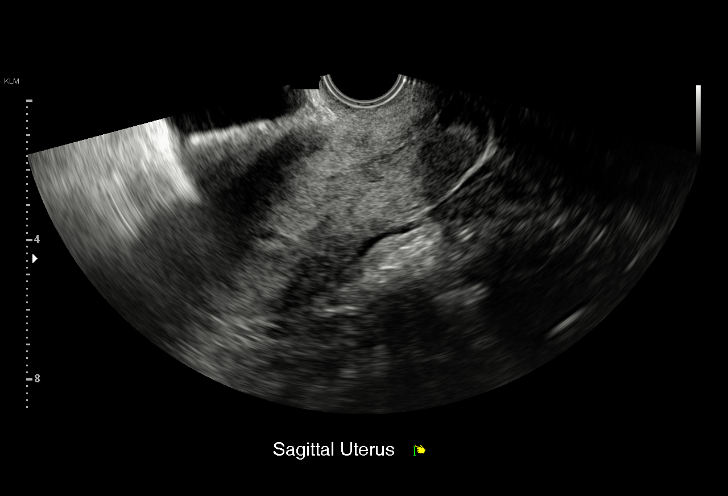
[im 17/46]
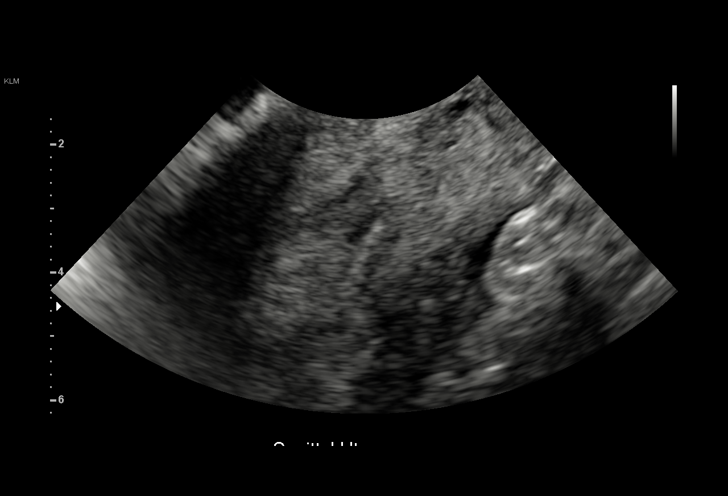
[im 19/46]
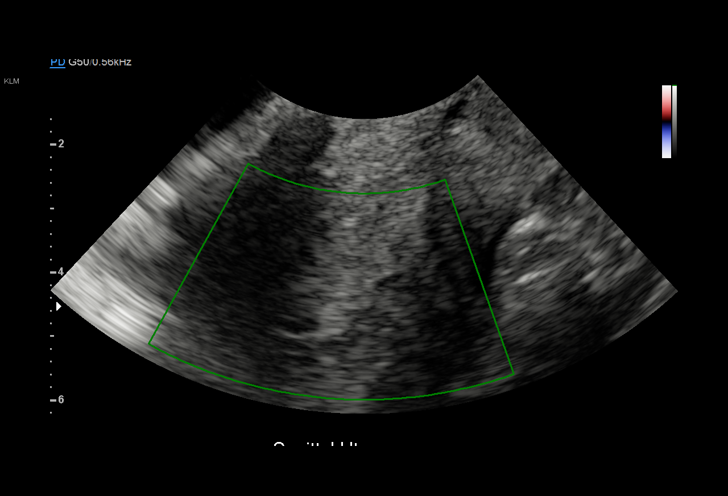
[im 23/46]
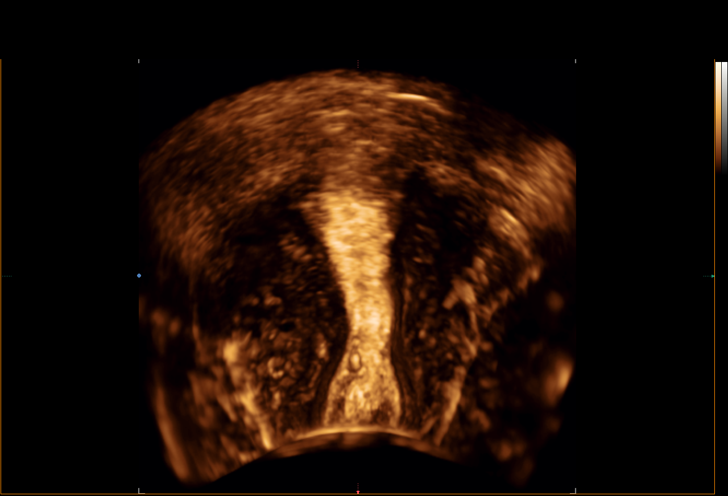
[im 27/46]
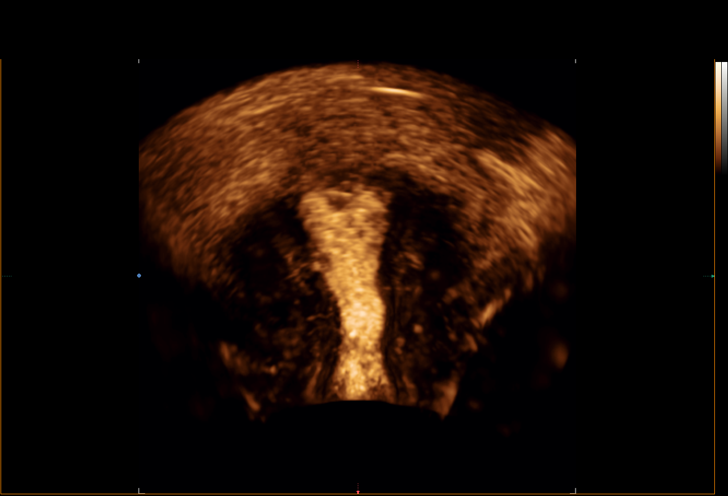
[im 29/46]
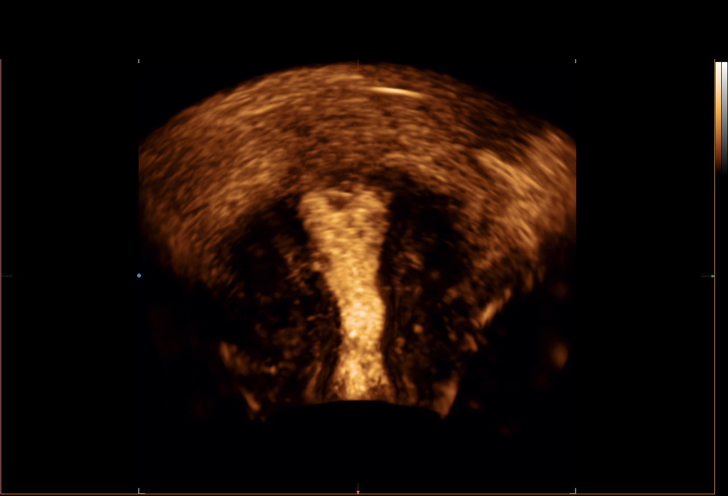
[im 32/46]
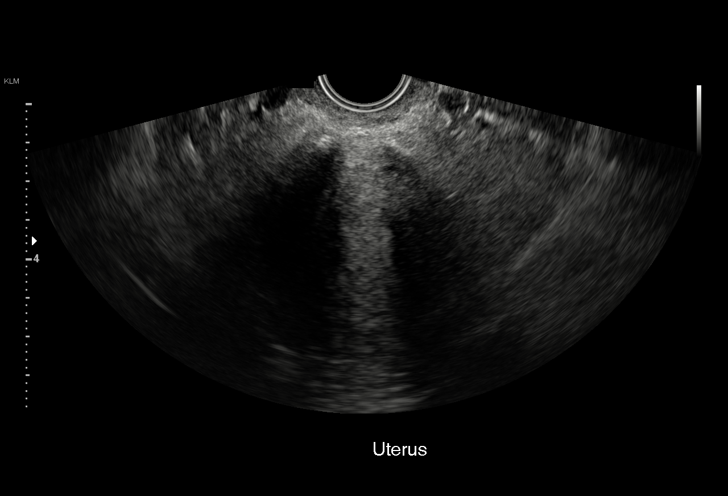
[im 36/46]
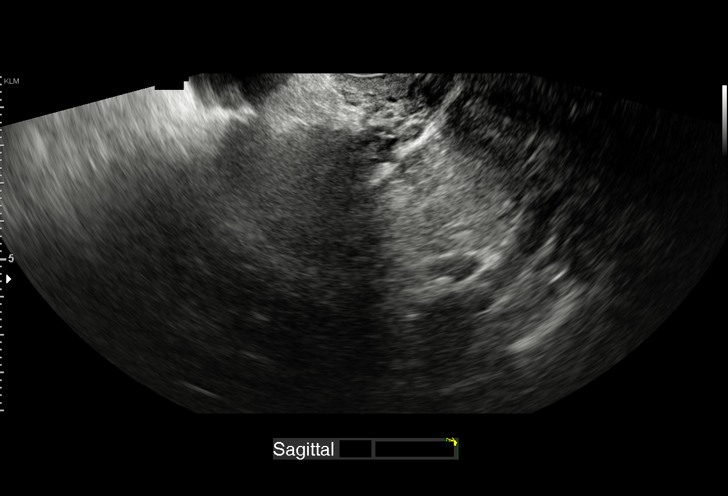
[im 38/46]
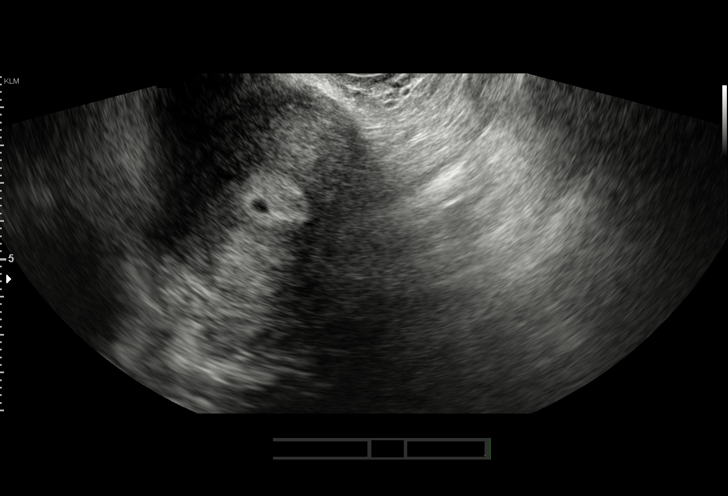
[im 42/46]
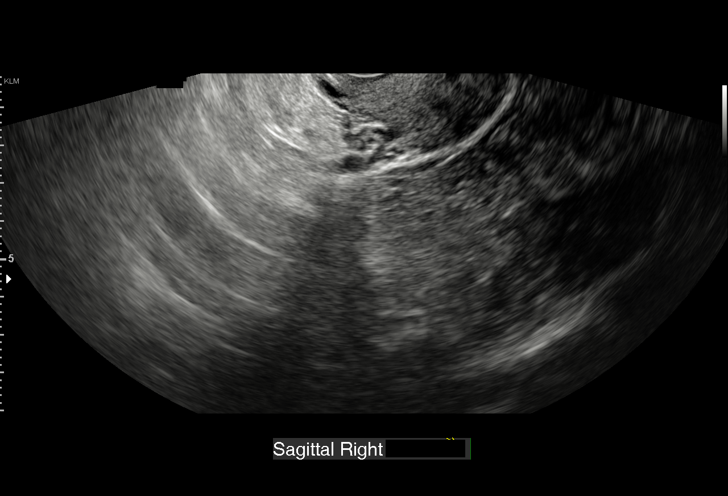
[im 46/46]
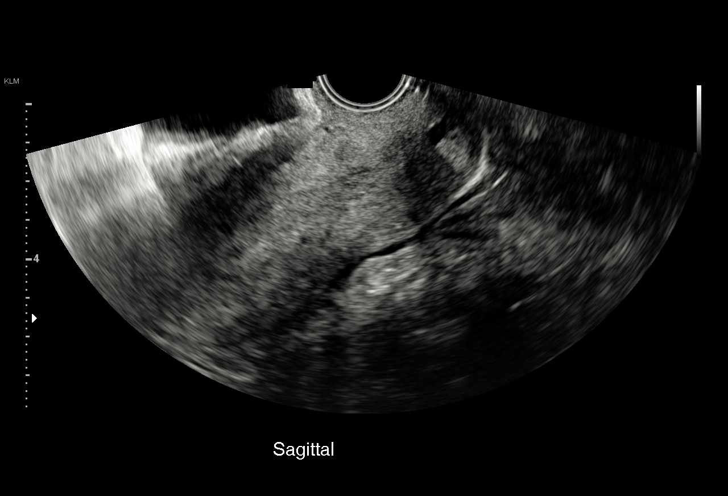

[15 of 25 positions shown; findings below may reference images not displayed]

FINDINGS: The study is limited limited due to the relatively collapsed urinary
bladder and the patient's body habitus. Uterus

Measurements: 9.8 x 4.8 x 5.0 cm. No fibroids or other mass
visualized.

Endometrium

Thickness: 16 mm.. There is a small amount of fluid in the fundal
portion of the endometrial cavity. No discrete endometrial mass.

The ovaries could not be demonstrated. No adnexal masses are
observed.

Other findings

No abnormal free fluid.
IMPRESSION: 1. Endometrial thickening without discrete mass. No uterine fibroids
are observed. If bleeding remains unresponsive to hormonal or
medical therapy, focal lesion work-up with sonohysterogram should be
considered. Endometrial biopsy should also be considered in
pre-menopausal patients at high risk for endometrial carcinoma.
(Ref: Radiological Reasoning: Algorithmic Workup of Abnormal Vaginal
Bleeding with Endovaginal Sonography and Sonohysterography. AJR
[BT]; 191:S68-73)
2. Nonvisualization of the ovaries. No adnexal masses or free pelvic
fluid observed.

## 2016-08-26 ENCOUNTER — Telehealth: Payer: Self-pay

## 2016-08-26 NOTE — Telephone Encounter (Signed)
Per Dr. Harolyn Rutherford, pt needs an endometrial biopsy and to inform pt that US showed thickened endometrial lining.  I called pt and informed her of the results and recommendation for endo bx.  I also explained to the pt what the endo bx procedure is and how it is done.  I also advised pt to take ibuprofen before appt on 09/25/16.  Pt stated understanding with no further questions.

## 2016-09-25 ENCOUNTER — Other Ambulatory Visit: Payer: Medicaid Other | Admitting: Obstetrics and Gynecology

## 2016-10-20 ENCOUNTER — Ambulatory Visit (INDEPENDENT_AMBULATORY_CARE_PROVIDER_SITE_OTHER): Payer: Medicaid Other | Admitting: Obstetrics and Gynecology

## 2016-10-20 ENCOUNTER — Encounter: Payer: Self-pay | Admitting: Obstetrics and Gynecology

## 2016-10-20 ENCOUNTER — Other Ambulatory Visit (HOSPITAL_COMMUNITY)
Admission: RE | Admit: 2016-10-20 | Discharge: 2016-10-20 | Disposition: A | Discharge status: Home / Self Care | Payer: Medicaid Other | Source: Ambulatory Visit | Attending: Obstetrics and Gynecology | Admitting: Obstetrics and Gynecology

## 2016-10-20 VITALS — BP 139/71 | HR 83 | Wt 347.0 lb

## 2016-10-20 DIAGNOSIS — Z1151 Encounter for screening for human papillomavirus (HPV): Secondary | ICD-10-CM | POA: Insufficient documentation

## 2016-10-20 DIAGNOSIS — N950 Postmenopausal bleeding: Secondary | ICD-10-CM

## 2016-10-20 DIAGNOSIS — N840 Polyp of corpus uteri: Secondary | ICD-10-CM | POA: Insufficient documentation

## 2016-10-20 DIAGNOSIS — Z01419 Encounter for gynecological examination (general) (routine) without abnormal findings: Secondary | ICD-10-CM | POA: Diagnosis present

## 2016-10-20 NOTE — Procedures (Signed)
Endometrial Biopsy Procedure Note  Pre-operative Diagnosis: Postmenopausal bleeding. BMI 60s. DM2. HTN  Post-operative Diagnosis: same  Procedure Details  Urine pregnancy test was not done.  The risks (including infection, bleeding, pain, and uterine perforation) and benefits of the procedure were explained to the patient and Written informed consent was obtained.  The patient was placed in the dorsal lithotomy position.  Bimanual exam showed the uterus to be in the neutral position.  A Graves' speculum inserted in the vagina, and the cervix was visualized and a pap smear performed. The cervix was then prepped with povidone iodine, and a sharp tenaculum was applied to the anterior lip of the cervix for stabilization.  A pipelle was inserted into the uterine cavity and sounded the uterus to a depth of 9.5cm.  A small to moderate amount of tissue was collected after 2 passes. The sample was sent for pathologic examination.  Condition: Stable  Complications: None  Plan: The patient was advised to call for any fever or for prolonged or severe pain or bleeding. She was advised to use OTC analgesics as needed for mild to moderate pain. She was advised to avoid vaginal intercourse for 48 hours or until the bleeding has completely stopped.  Durene Romans MD Attending Center for [employer])

## 2016-10-20 NOTE — Progress Notes (Addendum)
Obstetrics and Gynecology Visit New Patient Evaluation  Appointment Date: 10/20/2016  OBGYN Clinic: Center for [facility]  Primary Care Provider: Maggie Font  Referring Provider: Iona Beard, MD  Chief Complaint:  Chief Complaint  Patient presents with  . Gynecologic Exam    History of Present Illness: SUEANNA WHEELDON is a 55 y.o. Caucasian G3P2 (LMP: late 80s), seen for the above chief complaint. She is seen in consultation from Dr. Berdine Addison of [facility], [place].   Patient states she had her LMP in her late 17s and no h/o HRT. Patient has been having episodes over the past few months where she'll have 2-4days of bleeding (non painful, dark, 4-5 pads per day that aren't saturated and changed just to be clean) and then will have 2-4d of no bleeding and then the cycle will repeat. Looking in EPIC, she was seen by Dr. Lauralyn Primes in 03/2012 for AUB and noted to have a normal 2013 pap and SIS showed 9.7cm width x 9.5cm length with polyps ranging from 1.3-1.4cm. It looks likes she was to have surgery and that it was cancelled for some reason. I don't see an EMBx being done at that time.   She had an u/s done on 08/25/16 and 9.8 x 4.8 x 5cm uterus noted with ES 26mm but otherwise normal uterus. She was seen by her PCP on 6/13 for VB and referred to [place] for further evaluation   No breast s/s, fevers, chills, chest pain, SOB, nausea, vomiting, abdominal pain, dysuria, hematuria, vaginal itching or discharge,  blood in BMs  Review of Systems: Her 12 point review of systems is negative or as noted in the History of Present Illness.  Patient Active Problem List   Diagnosis Date Noted  . Depression 08/06/2015  . Esophageal reflux   . Morbid obesity ([place])   . Acute on chronic respiratory failure with hypoxia ([place])   . COPD exacerbation ([place]) 06/14/2015  . Acute bronchitis 06/14/2015  . 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 Acute on  chronic diastolic CHF (congestive heart failure), NYHA class 2 ([place])   . Essential hypertension   . Severe aortic valve stenosis 12/12/2014  . Aortic stenosis   . Morbid obesity with body mass index of 60.0-69.9 in adult [facility])   . Chronic diastolic congestive heart failure ([place])   . Acute pyelonephritis 09/23/2013  . Leukocytosis, unspecified 09/22/2013  . Hydronephrosis, right 09/22/2013  . COPD (chronic obstructive pulmonary disease) ([place]) 09/21/2013  . E. coli UTI 09/21/2013  . OSA (obstructive sleep apnea) 09/21/2013  . Nephrolithiasis 09/21/2013  . Nocturnal hypoxemia 09/12/2013  . 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 Abnormal CT scan, chest 05/12/2013  . Hyperlipidemia 11/30/2012  . Degenerative arthritis of left knee 09/10/2012  . Pulmonary embolism ([place]) 07/09/2012  .   04/08/2012  . Long term (current) use of anticoagulants 02/20/2012  . Hematuria 02/15/2012  . PE (pulmonary embolism) 02/08/2012  . Anemia 02/08/2012  . Diabetes mellitus without complication ([place]) 123456  . Asthma 02/08/2012  . Hypokalemia 02/08/2012  . GERD (gastroesophageal reflux disease)   . Degenerative arthritis of right knee 02/03/2012  . CAD (coronary artery disease) 12/08/2011  . Dyspnea 12/08/2011    Past Medical History:  Past Medical History:  Diagnosis Date  . Anemia   . Aortic stenosis   . Aortic valve stenosis, severe   . Arthritis    PAIN AND SEVERE OA LEFT KNEE ; S/P RIGHT TKA  ON 02/03/12; HAS LOWER BACK PAIN-UNABLE TO STAND MORE THAN 10 MIN; ARTHRITIS "ALL OVER"  . Asthma   . Blood transfusion    2013[facility]  . CAD (coronary artery disease)    Cath 2010 with DES x 1 RCA-- PT'S CARDIOLOGIST IS DR. Hoagland  . Chronic diastolic congestive heart failure ([place])   . COPD (chronic obstructive pulmonary disease) ([place])   . Depression   . Diabetes mellitus DIAGNOSED IN2010   [facility]  . Eczema    on back  . GERD (gastroesophageal reflux disease)   . Headache(784.0)     h/o migraines  . Heart murmur   . Hyperlipidemia   . Hypertension   . Kidney stones   . Morbid obesity with body mass index of 60.0-69.9 in adult [facility])   . Myocardial infarction    PT THINKS SHE WAS DX WITH MI AT THE TIME OF HEART STENTING  . Neck pain   . Pneumonia 02/08/12   HOSPITALIZED AT [facility] WITH PNEUMONIA AND WITH PULMONARY EMBOLUS  . Pulmonary embolism ([place]) 02/08/12   S/P RT TOTAL KNEE ON 02/03/12--ON 02/08/12--DEVELOPED ACUTE SOB AND CHEST PAIN--AND DIAGNOSED WITH  PULMONARY EMBOLUS AND PNEUMONIA  . Restless leg syndrome   . Shortness of breath    with exertion  . Sleep apnea    uses 3 liters O2 at night   . Uterine fibroid    NO PROBLEMS AT PRESENT FROM THE FIBROIDS-STATES SHE IS POST MENOPAUSAL-LAST MENSES 2010 EXCEPT FOR EPISODE THIS YR OF BLEEDING RELATED TO FIBROIDS.  Marland Kitchen Weakness    BOTH HANDS - S/P BILATERAL CARPAL TUNNEL RELEASE--BUT STILL HAS WEAKNESS--OFTEN DROPS THINGS    Past Surgical History:  Past Surgical History:  Procedure Laterality Date  . CARDIAC CATHETERIZATION    . CARPAL TUNNEL RELEASE     Bilateral  . CHOLECYSTECTOMY    . CORONARY ANGIOPLASTY     2010 has stent in place  . CYSTOSCOPY W/ RETROGRADES Right 09/21/2013   Procedure: CYSTOSCOPY WITH RIGHT RETROGRADE PYELOGRAM RIGHT DOUBLE J STENT ;  Surgeon: Fredricka Bonine, MD;  Location: [facility];  Service: Urology;  Laterality: Right;  . CYSTOSCOPY WITH URETEROSCOPY AND STENT PLACEMENT Right 10/25/2013   Procedure: CYSTOSCOPY RIGHT URETEROSCOPY HOLMIUM LASER LITHO AND STENT PLACEMENT;  Surgeon: Fredricka Bonine, MD;  Location: [facility];  Service: Urology;  Laterality: Right;  . HERNIA REPAIR    . INTRAOPERATIVE TRANSESOPHAGEAL ECHOCARDIOGRAM N/A 12/12/2014   Procedure: INTRAOPERATIVE TRANSESOPHAGEAL ECHOCARDIOGRAM;  Surgeon: Burnell Blanks, MD;  Location: [facility] OR;  Service: Cardiovascular;  Laterality: N/A;  . JOINT REPLACEMENT     bil total knees  . KNEE ARTHROPLASTY  02/03/2012    Procedure: COMPUTER ASSISTED TOTAL KNEE ARTHROPLASTY;  Surgeon: Mcarthur Rossetti, MD;  Location: [place];  Service: Orthopedics;  Laterality: Right;  Right total knee arthroplasty  . LEFT AND RIGHT HEART CATHETERIZATION WITH CORONARY ANGIOGRAM N/A 03/17/2013   Procedure: LEFT AND RIGHT HEART CATHETERIZATION WITH CORONARY ANGIOGRAM;  Surgeon: Burnell Blanks, MD;  Location[facility] C[facility] LAB;  Service: Cardiovascular;  Laterality: N/A;  . LEFT AND RIGHT HEART CATHETERIZATION WITH CORONARY/GRAFT ANGIOGRAM N/A 09/14/2014   Procedure: LEFT AND RIGHT HEART CATHETERIZATION WITH Beatrix Fetters;  Surgeon: Burnell Blanks, MD;  Location[facility] C[facility] LAB;  Service: Cardiovascular;  Laterality: N/A;  . TEE WITHOUT CARDIOVERSION N/A 03/14/2013   Procedure: TRANSESOPHAGEAL ECHOCARDIOGRAM (TEE);  Surgeon: Lelon Perla, MD;  Location[facility] ENDOSCOPY;  Service: Cardiovascular;  Laterality: N/A;  . TEE WITHOUT  CARDIOVERSION N/A 11/14/2014   Procedure: TRANSESOPHAGEAL ECHOCARDIOGRAM (TEE);  Surgeon: Lelon Perla, MD;  Location: [facility] ENDOSCOPY;  Service: Cardiovascular;  Laterality: N/A;  . TOTAL KNEE ARTHROPLASTY  09/10/2012   Procedure: TOTAL KNEE ARTHROPLASTY;  Surgeon: Mcarthur Rossetti, MD;  Location: [facility];  Service: Orthopedics;  Laterality: Left;  . TOTAL KNEE REVISION Right 07/15/2013   Procedure: REVISION ARTHROPLASTY RIGHT KNEE;  Surgeon: Mcarthur Rossetti, MD;  Location: [facility];  Service: Orthopedics;  Laterality: Right;  . TRANSCATHETER AORTIC VALVE REPLACEMENT, TRANSFEMORAL N/A 12/12/2014   Procedure: TRANSCATHETER AORTIC VALVE REPLACEMENT, TRANSFEMORAL;  Surgeon: Burnell Blanks, MD;  Location: [place];  Service: Cardiovascular;  Laterality: N/A;  . TRIGGER FINGER RELEASE  09/10/2012   Procedure: RELEASE TRIGGER FINGER/A-1 PULLEY;  Surgeon: Mcarthur Rossetti, MD;  Location: [facility];  Service: Orthopedics;  Laterality: Right;  Right Ring Finger  . TUBAL LIGATION       Past Obstetrical History:  OB History  Gravida Para Term Preterm AB Living  3            SAB TAB Ectopic Multiple Live Births          2    # Outcome Date GA Lbr Len/2nd Weight Sex Delivery Anes PTL Lv  3 Gravida           2 Gravida           1 Gravida             Obstetric Comments  SVD x 2. SAB x 1.     Past Gynecological History: As per HPI. No. history of abnormal pap smears (last pap smear: 2013, which was normal) No. HRT use.   Social History:  Social History   Social History  . Marital status: Married    Spouse name: N/A  . Number of children: 2  . Years of education: N/A   Occupational History  . Disabled    Social History Main Topics  . Smoking status: Former Smoker    Packs/day: 1.50    Years: 30.00    Types: Cigarettes    Quit date: 12/29/2000  . Smokeless tobacco: Never Used  . Alcohol use 0.6 oz/week    1 Cans of beer per week     Comment: rarely  . Drug use: No  . Sexual activity: No   Other Topics Concern  . Not on file   Social History Narrative  . No narrative on file    Family History:  Family History  Problem Relation Age of Onset  . Breast cancer Mother   . Emphysema Mother     smoked  . Heart disease Mother   . COPD Father     smoked  . Asthma Father   . Heart disease Father   . Cancer Brother     Sinus    Health Maintenance:  Mammogram Yes.   and patient states UTD;  Colonoscopy Yes.  2 or 3 years ago and negative except for a polyp per patient  Medications Ms. Yanke had no medications administered during this visit. Current Outpatient Prescriptions  Medication Sig Dispense Refill  . acetaminophen (TYLENOL) 160 MG/5ML solution Take 320 mg by mouth every 6 (six) hours as needed for moderate pain.    Marland Kitchen albuterol (PROVENTIL HFA;VENTOLIN HFA) 108 (90 BASE) MCG/ACT inhaler Inhale 2 puffs into the lungs every 4 (four) hours as needed for wheezing or shortness of breath.     Marland Kitchen albuterol (PROVENTIL) (2.5 MG/3ML)  0.083%  nebulizer solution Take 3 mLs (2.5 mg total) by nebulization every 4 (four) hours as needed for wheezing or shortness of breath. 150 mL 0  . aspirin EC 81 MG tablet Take 1 tablet (81 mg total) by mouth daily. 90 tablet 3  . atorvastatin (LIPITOR) 80 MG tablet Take 80 mg by mouth daily.    . citalopram (CELEXA) 20 MG tablet Take 20 mg by mouth daily.    . clopidogrel (PLAVIX) 75 MG tablet Take 75 mg by mouth every morning.     . ezetimibe (ZETIA) 10 MG tablet Take 1 tablet (10 mg total) by mouth daily. 30 tablet 11  . furosemide (LASIX) 40 MG tablet Take 40 mg by mouth 2 (two) times daily.    Marland Kitchen gabapentin (NEURONTIN) 300 MG capsule Take 900 mg by mouth at bedtime.     Marland Kitchen glipiZIDE (GLUCOTROL) 5 MG tablet Take 2 tablets (10 mg total) by mouth daily before supper. 60 tablet 0  . latanoprost (XALATAN) 0.005 % ophthalmic solution Place 1 drop into both eyes at bedtime.    Marland Kitchen losartan (COZAAR) 100 MG tablet Take 100 mg by mouth daily.    . OXYGEN-HELIUM IN Inhale 3 L into the lungs daily.     . potassium chloride SA (K-DUR,KLOR-CON) 20 MEQ tablet Take 40 mEq by mouth at bedtime.    Marland Kitchen azithromycin (ZITHROMAX Z-PAK) 250 MG tablet Take 1 tablet (250 mg total) by mouth once. (Patient not taking: Reported on 10/20/2016) 4 tablet 0  . benzonatate (TESSALON PERLES) 100 MG capsule Take 1 capsule (100 mg total) by mouth 3 (three) times daily as needed for cough. (Patient not taking: Reported on 10/20/2016) 20 capsule 0  . ciprofloxacin (CILOXAN) 0.3 % ophthalmic solution Place 2 drops into both eyes every 4 (four) hours while awake. Administer 1 drop, every 2 hours, while awake, for 2 days. Then 1 drop, every 4 hours, while awake, for the next 5 days. (Patient not taking: Reported on 10/20/2016) 5 mL 0  . guaiFENesin-codeine 100-10 MG/5ML syrup Take 5 mLs by mouth every 4 (four) hours as needed for cough. (Patient not taking: Reported on 10/20/2016) 120 mL 0  . losartan (COZAAR) 50 MG tablet Take 1 tablet (50 mg  total) by mouth daily with breakfast. 30 tablet 1  . metoprolol tartrate (LOPRESSOR) 25 MG tablet Take 1 tablet (25 mg total) by mouth 2 (two) times daily. 180 tablet 3  . predniSONE (DELTASONE) 20 MG tablet Day 1 and 2: Take 3 tabs  Day 3-5: Take 2 tabs.  Day 5-8: take 1 tab (Patient not taking: Reported on 10/20/2016) 16 tablet 0   No current facility-administered medications for this visit.     Allergies Review of patient's allergies indicates no known allergies.   Physical Exam:  BP 139/71   Pulse 83   Wt (!) 347 lb (157.4 kg)   LMP 02/03/2012   BMI 65.57 kg/m  Body mass index is 65.57 kg/m. General appearance: Well nourished, well developed female in no acute distress.  Neck:  Supple, normal appearance, and no thyromegaly  HEENT: patient wearing O2 Cardiovascular: normal s1 and s2.  No murmurs, rubs or gallops. Respiratory:  Clear to auscultation bilateral. Normal respiratory effort Abdomen: obese, nttp, nd, +BS Neuro/Psych:  Normal mood and affect.  Skin:  Warm and dry.  Lymphatic:  No inguinal lymphadenopathy.   Pelvic exam: is limited by body habitus EGBUS: within normal limits with moderate atrophy, Vagina: within normal limits, moderate atrophy  and with no blood in the vault with scant slight yellow discharge in the vault c/w old blood, Cervix: normal appearing cervix without tenderness, discharge or lesions. Uterus:  nonenlarged, nttp, and Adnexa:  normal adnexa and no mass, fullness, tenderness Rectovaginal: deferred  See procedure note for pap and embx  Laboratory: none  Radiology: as above  Assessment: pt stable with PMB  Plan:  D/w pt that even if results are negative from pap and embx, given endometrial stripe thickening and persistent AUB that I'd recommend hysteroscopy, D&C.  Will follow up results and call pt to go over next steps. Given her hx, she'd need cardiac/PCP clearance.   Pt amenable to plan  RTC PRN  Durene Romans  MD Attending Center for [facility])

## 2016-10-21 LAB — CYTOLOGY - PAP
Diagnosis: NEGATIVE
HPV: NOT DETECTED

## 2016-10-22 ENCOUNTER — Telehealth: Payer: Self-pay | Admitting: Obstetrics and Gynecology

## 2016-10-22 DIAGNOSIS — N8502 Endometrial intraepithelial neoplasia [EIN]: Secondary | ICD-10-CM | POA: Insufficient documentation

## 2016-10-22 MED ORDER — MEGESTROL ACETATE 40 MG PO TABS
80.0000 mg | ORAL_TABLET | Freq: Two times a day (BID) | ORAL | 3 refills | Status: DC
Start: 2016-10-22 — End: 2017-01-09

## 2016-10-22 NOTE — Telephone Encounter (Signed)
GYN Telephone Note  10/22/2016 @ 1144: patient called at [phone] and d/w her re: CAH on embx and negative pap smear. I told her that her situation makes management difficult, given that approx 25% of patient have full blow malignancy and usual plan of care with would be a D&C and if cancer is excluded to do a total hysterectomy, but given her history, exposing her to anesthesia complicates matters. I told her that I'd prefer her to transfer to a GYN Oncologist and proceed with whatever plan of care they deem appropriate and whether it is doing a D&C or deeming exposing her to anesthesia is too risk and just doing megace or an IUD and repeating her biopsy in 3 months, or they may deem her comorbidities not too bad and go ahead with a hyst.    In the interim, I will start her on megace 80 bid.   All questions asked and answered and patient is amenable to plan.  Durene Romans MD Attending Center for [employer])

## 2016-10-23 ENCOUNTER — Telehealth: Payer: Self-pay | Admitting: *Deleted

## 2016-10-24 ENCOUNTER — Encounter: Payer: Self-pay | Admitting: Obstetrics and Gynecology

## 2016-10-30 NOTE — Telephone Encounter (Signed)
Opened in error

## 2016-11-04 NOTE — Progress Notes (Signed)
GYNECOLOGIC ONCOLOGY NEW PATIENT CONSULTATION  Date of Service: 11/05/16 Referring Provider: Atlee Abide, MD Consulting Provider: Marcello Fennel. Carlis Abbott, MD  HISTORY OF PRESENT ILLNESS: [name] is a 55 y.o. woman who is seen in consultation at the request of Dr. Ilda Basset for evaluation of CAH.  [name] is a morbidly obese (BMI 23) [id number] with medical history is complicated by severe aortic stenosis s/p valve replacement in 2015, COPD, CHF, and CAD with a drug eluding stent in the right main coronary artery. She is on 3L O2 daily. She last saw her cardiologist about a year ago. She entered menopause in her 27s and has not been on HRT. She presented with PMB which has been ongoing with irregular spotting for a few months. Her PCP ordered a TVUS which was performed in August 2017. It showed a 9.8x4.8x5cm uterus with 33mm EMS. She was referred by her PCP to Dr. Ilda Basset who performed a pap and EMB. This returned with CAH in a polyp. Her pap was normal. Per her referral records she had a similar episode in 2013, but her evaluation and treatment at that time are unclear.  She reports no further spotting, but does endorse some cramping. She is taking PO progesterone. She reports SOB with exertion but denies active cardiac complaint. She reports attempting weight loss and is considering bariatric surgery per her report.  PAST MEDICAL HISTORY: Past Medical History:  Diagnosis Date  . Anemia   . Aortic stenosis   . Aortic valve stenosis, severe   . 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 Asthma   . Blood transfusion    2013[facility]  . CAD (coronary artery disease)    Cath 2010 with DES x 1 RCA-- PT'S CARDIOLOGIST IS DR. Attala  . Chronic diastolic congestive heart failure ([place])   . COPD (chronic obstructive pulmonary disease) ([place])   . Depression   . Diabetes mellitus DIAGNOSED  IN2010   [facility]  . Eczema    on back  . GERD (gastroesophageal reflux disease)   . Headache(784.0)    h/o migraines  . Heart murmur   . Hyperlipidemia   . Hypertension   . Kidney stones   . Morbid obesity with body mass index of 60.0-69.9 in adult [facility])   . Myocardial infarction    PT THINKS SHE WAS DX WITH MI AT THE TIME OF HEART STENTING  . Neck pain   . Pneumonia 02/08/12   HOSPITALIZED AT [facility] WITH PNEUMONIA AND WITH PULMONARY EMBOLUS  . Pulmonary embolism ([place]) 02/08/12   S/P RT TOTAL KNEE ON 02/03/12--ON 02/08/12--DEVELOPED ACUTE SOB AND CHEST PAIN--AND DIAGNOSED WITH  PULMONARY EMBOLUS AND PNEUMONIA  . Restless leg syndrome   . Shortness of breath    with exertion  . Sleep apnea    uses 3 liters O2 at night   . Uterine fibroid    NO PROBLEMS AT PRESENT FROM THE FIBROIDS-STATES SHE IS POST MENOPAUSAL-LAST MENSES 2010 EXCEPT FOR EPISODE THIS YR OF BLEEDING RELATED TO FIBROIDS.  Marland Kitchen Weakness    BOTH HANDS - S/P BILATERAL CARPAL TUNNEL RELEASE--BUT STILL HAS WEAKNESS--OFTEN DROPS THINGS    PAST SURGICAL HISTORY: Past Surgical History:  Procedure Laterality Date  . CARDIAC CATHETERIZATION    . CARPAL TUNNEL RELEASE     Bilateral  . CHOLECYSTECTOMY    . CORONARY ANGIOPLASTY  2010 has stent in place  . CYSTOSCOPY W/ RETROGRADES Right 09/21/2013   Procedure: CYSTOSCOPY WITH RIGHT RETROGRADE PYELOGRAM RIGHT DOUBLE J STENT ;  Surgeon: Fredricka Bonine, MD;  Location: [facility];  Service: Urology;  Laterality: Right;  . CYSTOSCOPY WITH URETEROSCOPY AND STENT PLACEMENT Right 10/25/2013   Procedure: CYSTOSCOPY RIGHT URETEROSCOPY HOLMIUM LASER LITHO AND STENT PLACEMENT;  Surgeon: Fredricka Bonine, MD;  Location: [facility];  Service: Urology;  Laterality: Right;  . HERNIA REPAIR    . INTRAOPERATIVE TRANSESOPHAGEAL ECHOCARDIOGRAM N/A 12/12/2014   Procedure: INTRAOPERATIVE TRANSESOPHAGEAL ECHOCARDIOGRAM;  Surgeon: Burnell Blanks, MD;  Location[facility] OR;  Service:  Cardiovascular;  Laterality: N/A;  . JOINT REPLACEMENT     bil total knees  . KNEE ARTHROPLASTY  02/03/2012   Procedure: COMPUTER ASSISTED TOTAL KNEE ARTHROPLASTY;  Surgeon: Mcarthur Rossetti, MD;  Location: [place];  Service: Orthopedics;  Laterality: Right;  Right total knee arthroplasty  . LEFT AND RIGHT HEART CATHETERIZATION WITH CORONARY ANGIOGRAM N/A 03/17/2013   Procedure: LEFT AND RIGHT HEART CATHETERIZATION WITH CORONARY ANGIOGRAM;  Surgeon: Burnell Blanks, MD;  Location: [facility] CATH LAB;  Service: Cardiovascular;  Laterality: N/A;  . LEFT AND RIGHT HEART CATHETERIZATION WITH CORONARY/GRAFT ANGIOGRAM N/A 09/14/2014   Procedure: LEFT AND RIGHT HEART CATHETERIZATION WITH Beatrix Fetters;  Surgeon: Burnell Blanks, MD;  Location: [facility] CATH LAB;  Service: Cardiovascular;  Laterality: N/A;  . TEE WITHOUT CARDIOVERSION N/A 03/14/2013   Procedure: TRANSESOPHAGEAL ECHOCARDIOGRAM (TEE);  Surgeon: Lelon Perla, MD;  Location: [facility] ENDOSCOPY;  Service: Cardiovascular;  Laterality: N/A;  . TEE WITHOUT CARDIOVERSION N/A 11/14/2014   Procedure: TRANSESOPHAGEAL ECHOCARDIOGRAM (TEE);  Surgeon: Lelon Perla, MD;  Location: [facility] ENDOSCOPY;  Service: Cardiovascular;  Laterality: N/A;  . TOTAL KNEE ARTHROPLASTY  09/10/2012   Procedure: TOTAL KNEE ARTHROPLASTY;  Surgeon: Mcarthur Rossetti, MD;  Location: [facility];  Service: Orthopedics;  Laterality: Left;  . TOTAL KNEE REVISION Right 07/15/2013   Procedure: REVISION ARTHROPLASTY RIGHT KNEE;  Surgeon: Mcarthur Rossetti, MD;  Location: [facility];  Service: Orthopedics;  Laterality: Right;  . TRANSCATHETER AORTIC VALVE REPLACEMENT, TRANSFEMORAL N/A 12/12/2014   Procedure: TRANSCATHETER AORTIC VALVE REPLACEMENT, TRANSFEMORAL;  Surgeon: Burnell Blanks, MD;  Location: [place];  Service: Cardiovascular;  Laterality: N/A;  . TRIGGER FINGER RELEASE  09/10/2012   Procedure: RELEASE TRIGGER FINGER/A-1 PULLEY;  Surgeon: Mcarthur Rossetti, MD;  Location: [facility];  Service: Orthopedics;  Laterality: Right;  Right Ring Finger  . TUBAL LIGATION      OB/GYN HISTORY: OB History  Gravida Para Term Preterm AB Living  3            SAB TAB Ectopic Multiple Live Births          2    # Outcome Date GA Lbr Len/2nd Weight Sex Delivery Anes PTL Lv  3 Gravida           2 Gravida           1 Gravida             Obstetric Comments  SVD x 2. SAB x 1.      Hx of HRT: denies Hx of STDs: denies Hx of abnormal pap smears: denies   MEDICATIONS:  Current Outpatient Prescriptions:  .  ACCU-CHEK AVIVA PLUS test strip, CHECK GLUCOSE TWICE DAILY Max of THREE TIMES DAILY, Disp: , Rfl: 4 .  acetaminophen (TYLENOL) 160 MG/5ML solution, Take 320 mg by mouth every 6 (six)  hours as needed for moderate pain., Disp: , Rfl:  .  albuterol (PROVENTIL HFA;VENTOLIN HFA) 108 (90 BASE) MCG/ACT inhaler, Inhale 2 puffs into the lungs every 4 (four) hours as needed for wheezing or shortness of breath. , Disp: , Rfl:  .  albuterol (PROVENTIL) (2.5 MG/3ML) 0.083% nebulizer solution, Take 3 mLs (2.5 mg total) by nebulization every 4 (four) hours as needed for wheezing or shortness of breath., Disp: 150 mL, Rfl: 0 .  aspirin EC 81 MG tablet, Take 1 tablet (81 mg total) by mouth daily., Disp: 90 tablet, Rfl: 3 .  atorvastatin (LIPITOR) 80 MG tablet, Take 80 mg by mouth daily., Disp: , Rfl:  .  citalopram (CELEXA) 20 MG tablet, Take 20 mg by mouth daily., Disp: , Rfl:  .  clopidogrel (PLAVIX) 75 MG tablet, Take 75 mg by mouth every morning. , Disp: , Rfl:  .  ezetimibe (ZETIA) 10 MG tablet, Take 1 tablet (10 mg total) by mouth daily., Disp: 30 tablet, Rfl: 11 .  furosemide (LASIX) 40 MG tablet, Take 40 mg by mouth 2 (two) times daily., Disp: , Rfl:  .  gabapentin (NEURONTIN) 300 MG capsule, Take 900 mg by mouth at bedtime. , Disp: , Rfl:  .  glipiZIDE (GLUCOTROL) 5 MG tablet, Take 2 tablets (10 mg total) by mouth daily before supper., Disp: 60 tablet, Rfl:  0 .  latanoprost (XALATAN) 0.005 % ophthalmic solution, Place 1 drop into both eyes at bedtime., Disp: , Rfl:  .  megestrol (MEGACE) 40 MG tablet, Take 2 tablets (80 mg total) by mouth 2 (two) times daily., Disp: 60 tablet, Rfl: 3 .  metoprolol tartrate (LOPRESSOR) 25 MG tablet, Take 1 tablet (25 mg total) by mouth 2 (two) times daily., Disp: 180 tablet, Rfl: 3 .  OXYGEN-HELIUM IN, Inhale 3 L into the lungs daily. , Disp: , Rfl:  .  potassium chloride SA (K-DUR,KLOR-CON) 20 MEQ tablet, Take 40 mEq by mouth at bedtime., Disp: , Rfl:  .  SYMBICORT 160-4.5 MCG/ACT inhaler, inhale 2 puffs EVERY TWELVE HOURS, Disp: , Rfl: 3 .  levonorgestrel (MIRENA) 20 MCG/24HR IUD, 1 Intra Uterine Device (1 each total) by Intrauterine route once., Disp: 1 each, Rfl: 0 .  losartan (COZAAR) 100 MG tablet, Take 100 mg by mouth daily., Disp: , Rfl:  .  losartan (COZAAR) 50 MG tablet, Take 1 tablet (50 mg total) by mouth daily with breakfast., Disp: 30 tablet, Rfl: 1  ALLERGIES: No Known Allergies  FAMILY HISTORY: Family History  Problem Relation Age of Onset  . Breast cancer Mother   . Emphysema Mother     smoked  . Heart disease Mother   . COPD Father     smoked  . Asthma Father   . Heart disease Father   . Cancer Brother     Sinus    SOCIAL HISTORY: Social History   Social History  . Marital status: Married    Spouse name: N/A  . Number of children: 2  . Years of education: N/A   Occupational History  . Disabled    Social History Main Topics  . Smoking status: Former Smoker    Packs/day: 1.50    Years: 30.00    Types: Cigarettes    Quit date: 12/29/2000  . Smokeless tobacco: Never Used  . Alcohol use 0.6 oz/week    1 Cans of beer per week     Comment: rarely  . Drug use: No  . Sexual activity: No  Other Topics Concern  . Not on file   Social History Narrative  . No narrative on file    REVIEW OF SYSTEMS: Complete 10-system review is negative except for SOB with exertion,  neuopathy, weakness, fatigue, vaginal bleeding and discharge.  PHYSICAL EXAM: BP (!) 153/68 (BP Location: Left Wrist, Patient Position: Sitting) Comment: Made the nurse aware of BP  Pulse 82   Temp 98.4 F (36.9 C) (Oral)   Resp 16   Ht 5\' 1"  (1.549 m)   Wt (!) 354 lb 1.6 oz (160.6 kg)   LMP 02/03/2012   SpO2 97%   BMI 66.91 kg/m  General: Alert, oriented, no acute distress. HEENT: Normocephalic, atraumatic.  Sclera anicteric, posterior oropharynx clear.  Normal dentition. Chest: Distant breath sounds, clear to auscultation bilaterally. Cardiovascular: Distant heart sounds with regular rate and rhythm, + systolic murmur. No rubs or gallops. Abdomen: Morbidly obese. Normoactive bowel sounds.  Soft, nondistended, nontender to palpation.  No masses or hepatosplenomegaly appreciated.  No palpable fluid wave.   Extremities: Grossly normal range of motion.  Warm, well perfused.  1+ edema bilaterally. Skin: No rashes or lesions. Lymphatics: No cervical, supraclavicular, or inguinal adenopathy. GU: External genitalia without lesions.  On speculum exam, normal appearing cervix visualized with blue speculum without significant difficulty.  Bimanual exam reveals mobile cervix but extremely limited by habitus. Rectovaginal exam confirms the above findings and reveals no nodularity.  LABORATORY AND RADIOLOGIC DATA: Outside medical records were reviewed to synthesize the above history, along with the history and physical obtained during the visit.  Outside laboratory, pathology, and imaging reports were reviewed, with pertinent results below.    07/2016 TVUS: 9.8x4.8x5cm uterus with 61mm EMS, no adnexal mass.  Pap 10/23: normal, negative HPV  Endometrium, curettage COMPLEX ATYPICAL HYPERPLASIA INVOLVING ENDOMETRIAL POLYP  ASSESSMENT AND PLAN: [name] is a 55 y.o. woman with CAH in a polyp with 3L baseline O2 requirement and CAD/CHF.  We reviewed the diagnosis of complex atypical  hyperplasia (CAH) and the treatment options, including medical management (Mirena IUD or progesterone PO) or hysterectomy.  The patient is not a candidate for hysterectomy due to her medical comorbidities with baseline O2 requirement. We discussed that underlying low grade endometrial cancer can be found now or in the future with this diagnosis in up to 40% of patients with CAH, but that the recommendation for hormonal management would likely still be recommended. She is currently on oral progesterone but will return to clinic on 12/7 for Mirena IUD placement. She will continue oral progesterone until this time. Repeat EMB will be performed at the time of her IUD placement. In the event of an invasive cancer, we will obtain imaging to evaluate for metastatic disease but likely still recommend IUD.   We discussed the role of obesity in the pathogenesis of CAH and endometrial cancer. She is encouraged to continue to work on weight loss. She states she is considering bariatric surgery and this is encouraged. She is also asked to see her cardiologist to medically optimize her health in case additional therapy is needed.  Regarding equipment needed for her IUD, she is able to tolerate exam well and her cervix was visualized with a blue speculum today. Additional assistants will be available if needed.  A copy of this note was sent to the patient's referring provider. I appreciate the opportunity to be involved in the care of this patient.  Marcello Fennel. Carlis Abbott, MD

## 2016-11-05 ENCOUNTER — Encounter: Payer: Self-pay | Admitting: Gynecologic Oncology

## 2016-11-05 ENCOUNTER — Ambulatory Visit: Payer: Medicaid Other | Attending: Gynecologic Oncology | Admitting: Gynecologic Oncology

## 2016-11-05 VITALS — BP 153/68 | HR 82 | Temp 98.4°F | Resp 16 | Ht 61.0 in | Wt 354.1 lb

## 2016-11-05 DIAGNOSIS — Z6841 Body Mass Index (BMI) 40.0 and over, adult: Secondary | ICD-10-CM | POA: Insufficient documentation

## 2016-11-05 DIAGNOSIS — N8501 Benign endometrial hyperplasia: Secondary | ICD-10-CM | POA: Diagnosis not present

## 2016-11-05 DIAGNOSIS — I350 Nonrheumatic aortic (valve) stenosis: Secondary | ICD-10-CM | POA: Diagnosis not present

## 2016-11-05 DIAGNOSIS — Z87891 Personal history of nicotine dependence: Secondary | ICD-10-CM | POA: Diagnosis not present

## 2016-11-05 DIAGNOSIS — Z7902 Long term (current) use of antithrombotics/antiplatelets: Secondary | ICD-10-CM | POA: Insufficient documentation

## 2016-11-05 DIAGNOSIS — I509 Heart failure, unspecified: Secondary | ICD-10-CM | POA: Insufficient documentation

## 2016-11-05 DIAGNOSIS — Z952 Presence of prosthetic heart valve: Secondary | ICD-10-CM | POA: Insufficient documentation

## 2016-11-05 DIAGNOSIS — Z7982 Long term (current) use of aspirin: Secondary | ICD-10-CM | POA: Diagnosis not present

## 2016-11-05 DIAGNOSIS — Z955 Presence of coronary angioplasty implant and graft: Secondary | ICD-10-CM | POA: Insufficient documentation

## 2016-11-05 DIAGNOSIS — Z79899 Other long term (current) drug therapy: Secondary | ICD-10-CM | POA: Insufficient documentation

## 2016-11-05 DIAGNOSIS — J449 Chronic obstructive pulmonary disease, unspecified: Secondary | ICD-10-CM | POA: Diagnosis not present

## 2016-11-05 DIAGNOSIS — I2510 Atherosclerotic heart disease of native coronary artery without angina pectoris: Secondary | ICD-10-CM | POA: Diagnosis not present

## 2016-11-05 MED ORDER — LEVONORGESTREL 20 MCG/24HR IU IUD: 1.0000 | INTRAUTERINE_SYSTEM | Freq: Once | INTRAUTERINE | 0 refills | Status: DC

## 2016-11-05 MED FILL — MIRENA SYSTEM: 20 | 1 days supply | Qty: 1 | Fill #0

## 2016-11-05 NOTE — Patient Instructions (Addendum)
Plan to follow up in the office on December 04, 2016 with Dr. Carlis Abbott for IUD placement.  You will need to go to the [facility] to pay your co-pay [phone]) for your IUD and you can bring it with you to your appointment or drop it off before your appointment at our office.  Intrauterine Device Insertion  Most often, an intrauterine device (IUD) is inserted into the uterus to prevent pregnancy. There are 2 types of IUDs available:  Copper IUD--This type of IUD creates an environment that is not favorable to sperm survival. The mechanism of action of the copper IUD is not known for certain. It can stay in place for 10 years.  Hormone IUD--This type of IUD contains the hormone progestin (synthetic progesterone). The progestin thickens the cervical mucus and prevents sperm from entering the uterus, and it also thins the uterine lining. There is no evidence that the hormone IUD prevents implantation. One hormone IUD can stay in place for up to 5 years, and a different hormone IUD can stay in place for up to 3 years. An IUD is the most cost-effective birth control if left in place for the full duration. It may be removed at any time. LET [facility] CARE PROVIDER KNOW ABOUT:  Any allergies you have.  All medicines you are taking, including vitamins, herbs, eye drops, creams, and over-the-counter medicines.  Previous problems you or members of your family have had with the use of anesthetics.  Any blood disorders you have.  Previous surgeries you have had.  Possibility of pregnancy.  Medical conditions you have. RISKS AND COMPLICATIONS  Generally, intrauterine device insertion is a safe procedure. However, as with any procedure, complications can occur. Possible complications include:  Accidental puncture (perforation) of the uterus.  Accidental placement of the IUD either in the muscle layer of the uterus (myometrium) or outside the uterus. If this happens, the IUD can be found  essentially floating around the bowels and must be taken out surgically.  The IUD may fall out of the uterus (expulsion). This is more common in women who have recently had a child.   Pregnancy in the fallopian tube (ectopic).  Pelvic inflammatory disease (PID), which is infection of the uterus and fallopian tubes. The risk of PID is slightly increased in the first 20 days after the IUD is placed, but the overall risk is still very low. BEFORE THE PROCEDURE  Schedule the IUD insertion for when you will have your menstrual period or right after, to make sure you are not pregnant. Placement of the IUD is better tolerated shortly after a menstrual cycle.  You may need to take tests or be examined to make sure you are not pregnant.  You may be required to take a pregnancy test.  You may be required to get checked for sexually transmitted infections (STIs) prior to placement. Placing an IUD in someone who has an infection can make the infection worse.  You may be given a pain reliever to take 1 or 2 hours before the procedure.  An exam will be performed to determine the size and position of your uterus.  Ask your health care provider about changing or stopping your regular medicines. PROCEDURE   A tool (speculum) is placed in the vagina. This allows your health care provider to see the lower part of the uterus (cervix).  The cervix is prepped with a medicine that lowers the risk of infection.  You may be given a medicine  to numb each side of the cervix (intracervical or paracervical block). This is used to block and control any discomfort with insertion.  A tool (uterine sound) is inserted into the uterus to determine the length of the uterine cavity and the direction the uterus may be tilted.  A slim instrument (IUD inserter) is inserted through the cervical canal and into your uterus.  The IUD is placed in the uterine cavity and the insertion device is removed.  The nylon string  that is attached to the IUD and used for eventual IUD removal is trimmed. It is trimmed so that it lays high in the vagina, just outside the cervix. AFTER THE PROCEDURE  You may have bleeding after the procedure. This is normal. It varies from light spotting for a few days to menstrual-like bleeding.  You may have mild cramping.   This information is not intended to replace advice given to you by your health care provider. Make sure you discuss any questions you have with your health care provider.   Document Released: 08/13/2011 Document Revised: 10/05/2013 Document Reviewed: 06/05/2013 Elsevier Interactive Patient Education [employer].

## 2016-12-03 NOTE — Progress Notes (Signed)
GYNECOLOGIC ONCOLOGY RETURN VISIT  12/04/16  REASON FOR VISIT: IUD insertion for CAH  ASSESSMENT AND PLAN: [name] is a 55 y.o. woman with complex atypical uterine hyperplasia who presents for IUD insertion.  IUD and EMB performed today. Patient tolerated well. Will follow up on results and call if invasive cancer is identified. She will return for string check in 2-4 weeks.  If CAH or less on biopsy, we will plan for visit in 3 months for repeat EMB. All questions answered today about plan of care.   Gillian Scarce, MD  HPI: [name] is a 55 y.o. morbidly obese (BMI 52) [id number] with medical history complicated by severe aortic stenosis s/p valve replacement in 2015, COPD, CHF, and CAD with a drug eluding stent in the right main coronary artery. She is on 3L O2 daily. She presented with PMB which has been ongoing with irregular spotting for a few months. Her PCP ordered a TVUS which was performed in August 2017. It showed a 9.8x4.8x5cm uterus with 66mm EMS. She was referred by her PCP to Dr. Ilda Basset who performed a pap and EMB. This returned with CAH in a polyp. Her pap was normal. Please refer to my initial consult note from 11/05/16 for further details. In brief, she returns today for IUD insertion for management.  INTERVAL HISTORY: She has been doing well since her last visit. No new concerns. No spotting. Intermittent pelvic pain. She has not yet seen her PCP for medical optimization.  PAST MEDICAL/SURGICAL HX: Past Medical History:  Diagnosis Date  . Anemia   . Aortic stenosis   . Aortic valve stenosis, severe   . 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 Asthma   . Blood transfusion    2013[facility]  . CAD (coronary artery disease)    Cath 2010 with DES x 1 RCA-- PT'S CARDIOLOGIST IS DR. Robins  . Chronic diastolic congestive heart failure ([place])   . COPD  (chronic obstructive pulmonary disease) ([place])   . Depression   . Diabetes mellitus DIAGNOSED IN2010   [facility]  . Eczema    on back  . GERD (gastroesophageal reflux disease)   . Headache(784.0)    h/o migraines  . Heart murmur   . Hyperlipidemia   . Hypertension   . Kidney stones   . Morbid obesity with body mass index of 60.0-69.9 in adult [facility])   . Myocardial infarction    PT THINKS SHE WAS DX WITH MI AT THE TIME OF HEART STENTING  . Neck pain   . Pneumonia 02/08/12   HOSPITALIZED AT [facility] WITH PNEUMONIA AND WITH PULMONARY EMBOLUS  . Pulmonary embolism ([place]) 02/08/12   S/P RT TOTAL KNEE ON 02/03/12--ON 02/08/12--DEVELOPED ACUTE SOB AND CHEST PAIN--AND DIAGNOSED WITH  PULMONARY EMBOLUS AND PNEUMONIA  . Restless leg syndrome   . Shortness of breath    with exertion  . Sleep apnea    uses 3 liters O2 at night   . Uterine fibroid    NO PROBLEMS AT PRESENT FROM THE FIBROIDS-STATES SHE IS POST MENOPAUSAL-LAST MENSES 2010 EXCEPT FOR EPISODE THIS YR OF BLEEDING RELATED TO FIBROIDS.  Marland Kitchen Weakness    BOTH HANDS - S/P BILATERAL CARPAL TUNNEL RELEASE--BUT STILL HAS WEAKNESS--OFTEN DROPS THINGS    Past Surgical History:  Procedure Laterality Date  . CARDIAC CATHETERIZATION    .  CARPAL TUNNEL RELEASE     Bilateral  . CHOLECYSTECTOMY    . CORONARY ANGIOPLASTY     2010 has stent in place  . CYSTOSCOPY W/ RETROGRADES Right 09/21/2013   Procedure: CYSTOSCOPY WITH RIGHT RETROGRADE PYELOGRAM RIGHT DOUBLE J STENT ;  Surgeon: Fredricka Bonine, MD;  Location: [facility];  Service: Urology;  Laterality: Right;  . CYSTOSCOPY WITH URETEROSCOPY AND STENT PLACEMENT Right 10/25/2013   Procedure: CYSTOSCOPY RIGHT URETEROSCOPY HOLMIUM LASER LITHO AND STENT PLACEMENT;  Surgeon: Fredricka Bonine, MD;  Location: [facility];  Service: Urology;  Laterality: Right;  . HERNIA REPAIR    . INTRAOPERATIVE TRANSESOPHAGEAL ECHOCARDIOGRAM N/A 12/12/2014   Procedure: INTRAOPERATIVE TRANSESOPHAGEAL ECHOCARDIOGRAM;   Surgeon: Burnell Blanks, MD;  Location: [facility] OR;  Service: Cardiovascular;  Laterality: N/A;  . JOINT REPLACEMENT     bil total knees  . KNEE ARTHROPLASTY  02/03/2012   Procedure: COMPUTER ASSISTED TOTAL KNEE ARTHROPLASTY;  Surgeon: Mcarthur Rossetti, MD;  Location: [place];  Service: Orthopedics;  Laterality: Right;  Right total knee arthroplasty  . LEFT AND RIGHT HEART CATHETERIZATION WITH CORONARY ANGIOGRAM N/A 03/17/2013   Procedure: LEFT AND RIGHT HEART CATHETERIZATION WITH CORONARY ANGIOGRAM;  Surgeon: Burnell Blanks, MD;  Location: [facility] CATH LAB;  Service: Cardiovascular;  Laterality: N/A;  . LEFT AND RIGHT HEART CATHETERIZATION WITH CORONARY/GRAFT ANGIOGRAM N/A 09/14/2014   Procedure: LEFT AND RIGHT HEART CATHETERIZATION WITH Beatrix Fetters;  Surgeon: Burnell Blanks, MD;  Location: [facility] CATH LAB;  Service: Cardiovascular;  Laterality: N/A;  . TEE WITHOUT CARDIOVERSION N/A 03/14/2013   Procedure: TRANSESOPHAGEAL ECHOCARDIOGRAM (TEE);  Surgeon: Lelon Perla, MD;  Location: [facility] ENDOSCOPY;  Service: Cardiovascular;  Laterality: N/A;  . TEE WITHOUT CARDIOVERSION N/A 11/14/2014   Procedure: TRANSESOPHAGEAL ECHOCARDIOGRAM (TEE);  Surgeon: Lelon Perla, MD;  Location: [facility] ENDOSCOPY;  Service: Cardiovascular;  Laterality: N/A;  . TOTAL KNEE ARTHROPLASTY  09/10/2012   Procedure: TOTAL KNEE ARTHROPLASTY;  Surgeon: Mcarthur Rossetti, MD;  Location: [facility];  Service: Orthopedics;  Laterality: Left;  . TOTAL KNEE REVISION Right 07/15/2013   Procedure: REVISION ARTHROPLASTY RIGHT KNEE;  Surgeon: Mcarthur Rossetti, MD;  Location: [facility];  Service: Orthopedics;  Laterality: Right;  . TRANSCATHETER AORTIC VALVE REPLACEMENT, TRANSFEMORAL N/A 12/12/2014   Procedure: TRANSCATHETER AORTIC VALVE REPLACEMENT, TRANSFEMORAL;  Surgeon: Burnell Blanks, MD;  Location: [place];  Service: Cardiovascular;  Laterality: N/A;  . TRIGGER FINGER RELEASE  09/10/2012   Procedure:  RELEASE TRIGGER FINGER/A-1 PULLEY;  Surgeon: Mcarthur Rossetti, MD;  Location: [facility];  Service: Orthopedics;  Laterality: Right;  Right Ring Finger  . TUBAL LIGATION      Family History  Problem Relation Age of Onset  . Breast cancer Mother   . Emphysema Mother     smoked  . Heart disease Mother   . COPD Father     smoked  . Asthma Father   . Heart disease Father   . Cancer Brother     Sinus    Social History   Social History  . Marital status: Married    Spouse name: N/A  . Number of children: 2  . Years of education: N/A   Occupational History  . Disabled    Social History Main Topics  . Smoking status: Former Smoker    Packs/day: 1.50    Years: 30.00    Types: Cigarettes    Quit date: 12/29/2000  . Smokeless tobacco: Never Used  . Alcohol use 0.6 oz/week  1 Cans of beer per week     Comment: rarely  . Drug use: No  . Sexual activity: No   Other Topics Concern  . Not on file   Social History Narrative  . No narrative on file    CURRENT MEDICATIONS:  Current Outpatient Prescriptions:  .  ACCU-CHEK AVIVA PLUS test strip, CHECK GLUCOSE TWICE DAILY Max of THREE TIMES DAILY, Disp: , Rfl: 4 .  acetaminophen (TYLENOL) 160 MG/5ML solution, Take 320 mg by mouth every 6 (six) hours as needed for moderate pain., Disp: , Rfl:  .  albuterol (PROVENTIL HFA;VENTOLIN HFA) 108 (90 BASE) MCG/ACT inhaler, Inhale 2 puffs into the lungs every 4 (four) hours as needed for wheezing or shortness of breath. , Disp: , Rfl:  .  albuterol (PROVENTIL) (2.5 MG/3ML) 0.083% nebulizer solution, Take 3 mLs (2.5 mg total) by nebulization every 4 (four) hours as needed for wheezing or shortness of breath., Disp: 150 mL, Rfl: 0 .  aspirin EC 81 MG tablet, Take 1 tablet (81 mg total) by mouth daily., Disp: 90 tablet, Rfl: 3 .  atorvastatin (LIPITOR) 80 MG tablet, Take 80 mg by mouth daily., Disp: , Rfl:  .  citalopram (CELEXA) 20 MG tablet, Take 20 mg by mouth daily., Disp: , Rfl:  .   clopidogrel (PLAVIX) 75 MG tablet, Take 75 mg by mouth every morning. , Disp: , Rfl:  .  ezetimibe (ZETIA) 10 MG tablet, Take 1 tablet (10 mg total) by mouth daily., Disp: 30 tablet, Rfl: 11 .  furosemide (LASIX) 40 MG tablet, Take 40 mg by mouth 2 (two) times daily., Disp: , Rfl:  .  gabapentin (NEURONTIN) 300 MG capsule, Take 900 mg by mouth at bedtime. , Disp: , Rfl:  .  glipiZIDE (GLUCOTROL) 5 MG tablet, Take 2 tablets (10 mg total) by mouth daily before supper., Disp: 60 tablet, Rfl: 0 .  latanoprost (XALATAN) 0.005 % ophthalmic solution, Place 1 drop into both eyes at bedtime., Disp: , Rfl:  .  levonorgestrel (MIRENA) 20 MCG/24HR IUD, 1 Intra Uterine Device (1 each total) by Intrauterine route once., Disp: 1 each, Rfl: 0 .  losartan (COZAAR) 100 MG tablet, Take 100 mg by mouth daily., Disp: , Rfl:  .  losartan (COZAAR) 50 MG tablet, Take 1 tablet (50 mg total) by mouth daily with breakfast. (Patient not taking: Reported on 12/04/2016), Disp: 30 tablet, Rfl: 1 .  megestrol (MEGACE) 40 MG tablet, Take 2 tablets (80 mg total) by mouth 2 (two) times daily., Disp: 60 tablet, Rfl: 3 .  metoprolol tartrate (LOPRESSOR) 25 MG tablet, Take 1 tablet (25 mg total) by mouth 2 (two) times daily., Disp: 180 tablet, Rfl: 3 .  OXYGEN-HELIUM IN, Inhale 3 L into the lungs daily. , Disp: , Rfl:  .  potassium chloride SA (K-DUR,KLOR-CON) 20 MEQ tablet, Take 40 mEq by mouth at bedtime., Disp: , Rfl:  .  SYMBICORT 160-4.5 MCG/ACT inhaler, inhale 2 puffs EVERY TWELVE HOURS, Disp: , Rfl: 3  REVIEW OF SYSTEMS: Complete 10-system review is negative except as above in Interval History.  PHYSICAL EXAM: Ht 5\' 1"  (1.549 m)   Wt (!) 355 lb (161 kg)   LMP 02/03/2012   BMI 67.08 kg/m  General: Alert, oriented. Chest: Distant breath sounds, clear bilaterally. Cardiovascular: Distant heart sounds with regular rate and rhythm, + systolic murmur. No rubs or gallops. Abdomen: Morbidly obese. Normoactive bowel sounds.   Soft, nondistended, nontender to palpation.  No masses or hepatosplenomegaly appreciated.  No fluid wave.   Extremities: 1+ edema bilaterally. Skin: No rashes or lesions. GU: see below.  IUD INSERTION PROCEDURE Indication for IUD insertion is CAH.  A complete discussion of the risks and benefits of IUD use and the details of the insertion procedure was held with the patient.  All questions were answered.  Consent was obtained.  IUD Lot # [id number] Exp: 4/20  The patient was placed in low lithotomy . A bimanual exam was performed and the uterus noted to be anteverted.  A speculum was placed.  The cervix swabbed with Betadine. A tenaculum was placed on the anterior cervical lip.  The uterus was sounded to 10 cm. A repeat EMB was performed. The IUD was placed to the uterine fundus without difficulty. The strings were cut to 3 cm.  The tenaculum was removed and hemostasis was ensured.  The speculum was removed.  The patient tolerated the procedure well.

## 2016-12-04 ENCOUNTER — Encounter: Payer: Self-pay | Admitting: Gynecologic Oncology

## 2016-12-04 ENCOUNTER — Ambulatory Visit: Payer: Medicaid Other | Attending: Gynecologic Oncology | Admitting: Gynecologic Oncology

## 2016-12-04 VITALS — BP 170/71 | HR 98 | Temp 98.1°F | Ht 61.0 in | Wt 355.0 lb

## 2016-12-04 DIAGNOSIS — J449 Chronic obstructive pulmonary disease, unspecified: Secondary | ICD-10-CM | POA: Diagnosis not present

## 2016-12-04 DIAGNOSIS — I2510 Atherosclerotic heart disease of native coronary artery without angina pectoris: Secondary | ICD-10-CM | POA: Insufficient documentation

## 2016-12-04 DIAGNOSIS — Z87891 Personal history of nicotine dependence: Secondary | ICD-10-CM | POA: Diagnosis not present

## 2016-12-04 DIAGNOSIS — I350 Nonrheumatic aortic (valve) stenosis: Secondary | ICD-10-CM | POA: Insufficient documentation

## 2016-12-04 DIAGNOSIS — N8501 Benign endometrial hyperplasia: Secondary | ICD-10-CM

## 2016-12-04 DIAGNOSIS — Z7982 Long term (current) use of aspirin: Secondary | ICD-10-CM | POA: Diagnosis not present

## 2016-12-04 DIAGNOSIS — Z7902 Long term (current) use of antithrombotics/antiplatelets: Secondary | ICD-10-CM | POA: Insufficient documentation

## 2016-12-04 DIAGNOSIS — Z79899 Other long term (current) drug therapy: Secondary | ICD-10-CM | POA: Diagnosis not present

## 2016-12-04 DIAGNOSIS — Z952 Presence of prosthetic heart valve: Secondary | ICD-10-CM | POA: Diagnosis not present

## 2016-12-04 DIAGNOSIS — E250 Congenital adrenogenital disorders associated with enzyme deficiency: Secondary | ICD-10-CM | POA: Insufficient documentation

## 2016-12-04 DIAGNOSIS — I509 Heart failure, unspecified: Secondary | ICD-10-CM | POA: Insufficient documentation

## 2016-12-04 DIAGNOSIS — Z955 Presence of coronary angioplasty implant and graft: Secondary | ICD-10-CM | POA: Insufficient documentation

## 2016-12-04 DIAGNOSIS — Z6841 Body Mass Index (BMI) 40.0 and over, adult: Secondary | ICD-10-CM | POA: Insufficient documentation

## 2016-12-04 DIAGNOSIS — N852 Hypertrophy of uterus: Secondary | ICD-10-CM | POA: Insufficient documentation

## 2016-12-04 DIAGNOSIS — Z30430 Encounter for insertion of intrauterine contraceptive device: Secondary | ICD-10-CM | POA: Diagnosis not present

## 2016-12-04 NOTE — Patient Instructions (Signed)
IUD inserted today and endometrial biopsy was obtained. Follow up with Dr Denman George 2nd week of January for IUD check. Our office will contact you with biopsy results next week.

## 2016-12-04 NOTE — Addendum Note (Signed)
Addended by: Clayborne Artist A on: 12/04/2016 03:42 PM   Modules accepted: Orders

## 2016-12-11 ENCOUNTER — Telehealth: Payer: Self-pay | Admitting: Nurse Practitioner

## 2016-12-11 NOTE — Telephone Encounter (Signed)
Per Lenna Sciara, NP, patient informed that PAP results show hyperplasia, or pre-cancer, but not cancer. She has a f/u apt with Dr. Denman George on 01/09/17 at 2:30 to discuss management options and recommendations. She verbalizes understanding and thanks for the call.

## 2017-01-05 ENCOUNTER — Other Ambulatory Visit: Payer: Self-pay | Admitting: *Deleted

## 2017-01-05 MED ORDER — EZETIMIBE 10 MG PO TABS: 10.0000 mg | ORAL_TABLET | Freq: Every day | ORAL | 0 refills | Status: DC

## 2017-01-06 ENCOUNTER — Other Ambulatory Visit: Payer: Self-pay

## 2017-01-06 MED ORDER — EZETIMIBE 10 MG PO TABS: 10.0000 mg | ORAL_TABLET | Freq: Every day | ORAL | 1 refills | Status: DC

## 2017-01-07 ENCOUNTER — Other Ambulatory Visit: Payer: Self-pay | Admitting: Gynecologic Oncology

## 2017-01-07 ENCOUNTER — Telehealth: Payer: Self-pay | Admitting: Gynecologic Oncology

## 2017-01-07 DIAGNOSIS — N939 Abnormal uterine and vaginal bleeding, unspecified: Secondary | ICD-10-CM

## 2017-01-07 NOTE — Telephone Encounter (Signed)
Patient called with complaints of new uterine bleeding within the last week.  She says she has started bleeding with blood clots. She did not have any bleeding after having the IUD placed in December.  Advised that we would reach out to Dr. Carlis Abbott for recommendations.  Returned call and left message for patient with Dr. Ainsley Spinner recommendations to proceed with an ultrasound to check for placement.  Advised to please call the office to get her ultrasound scheduled.

## 2017-01-07 NOTE — Telephone Encounter (Signed)
Patient notified of ultrasound appt tomorrow at [facility].  Plan to have the ultrasound then follow up in the office the next day with Dr. Denman George.  Verbalizing understanding.  Advised to call for any needs or concerns.

## 2017-01-08 ENCOUNTER — Ambulatory Visit (HOSPITAL_COMMUNITY)
Admission: RE | Admit: 2017-01-08 | Discharge: 2017-01-08 | Disposition: A | Discharge status: Home / Self Care | Payer: Medicaid Other | Source: Ambulatory Visit | Attending: Gynecologic Oncology | Admitting: Gynecologic Oncology

## 2017-01-08 DIAGNOSIS — N939 Abnormal uterine and vaginal bleeding, unspecified: Secondary | ICD-10-CM | POA: Diagnosis not present

## 2017-01-08 DIAGNOSIS — Z975 Presence of (intrauterine) contraceptive device: Secondary | ICD-10-CM | POA: Insufficient documentation

## 2017-01-08 IMAGING — US US TRANSVAGINAL NON-OB
1 series · 15 of 25 positions shown · non-contrast
Comparison: [DATE]

CLINICAL DATA: IUD placement.  Apple uterine bleeding

EXAM:
ULTRASOUND PELVIS TRANSVAGINAL
TECHNIQUE: Transvaginal ultrasound examination of the pelvis was performed
including evaluation of the uterus, ovaries, adnexal regions, and
pelvic cul-de-sac.

[Series 1: us transvaginal non-ob · 15 of 73 slices shown]
[im 1/73]
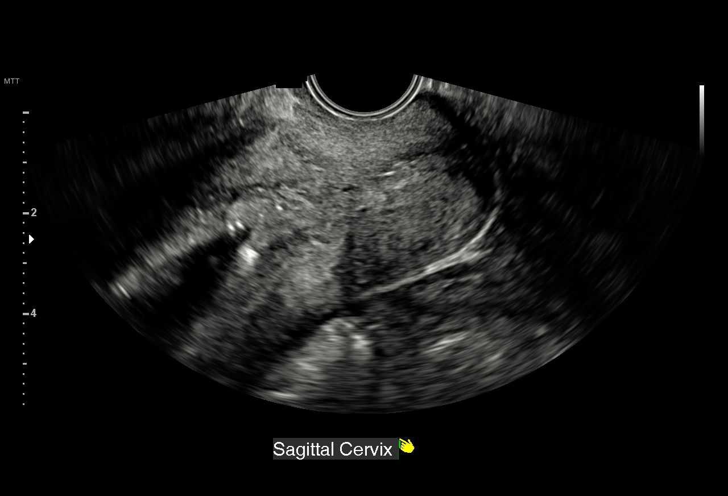
[im 7/73]
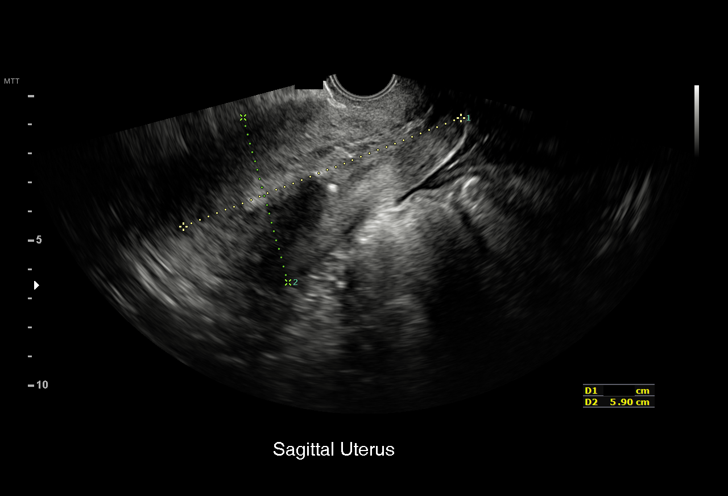
[im 13/73]
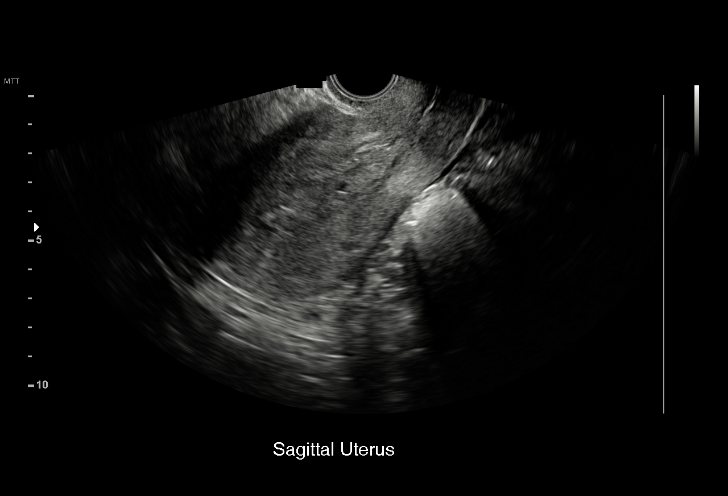
[im 16/73]
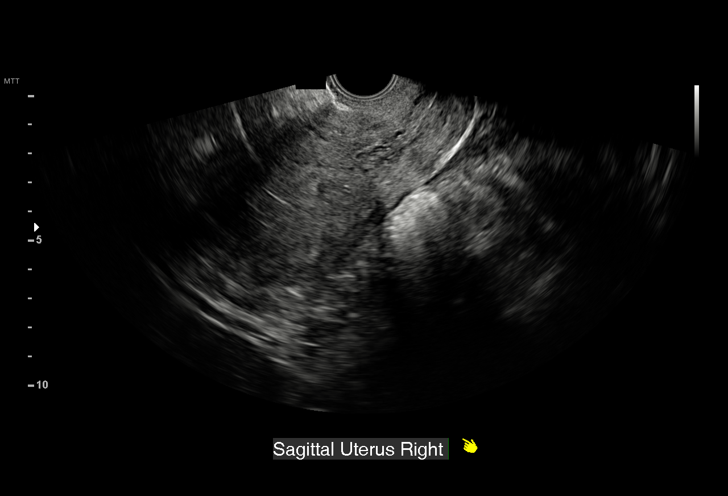
[im 22/73]
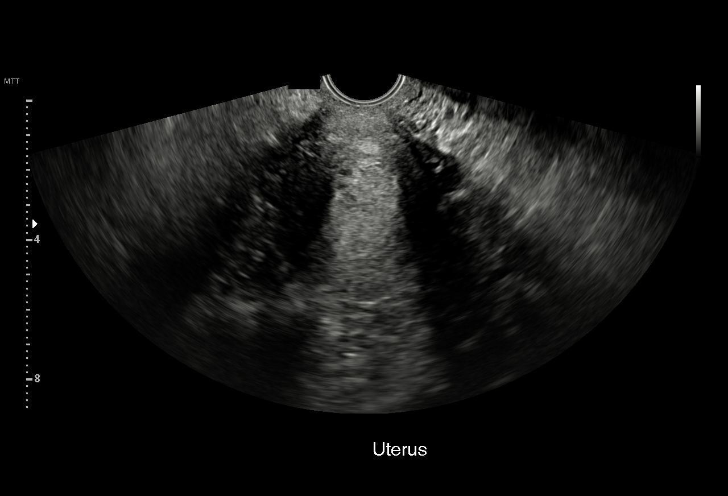
[im 28/73]
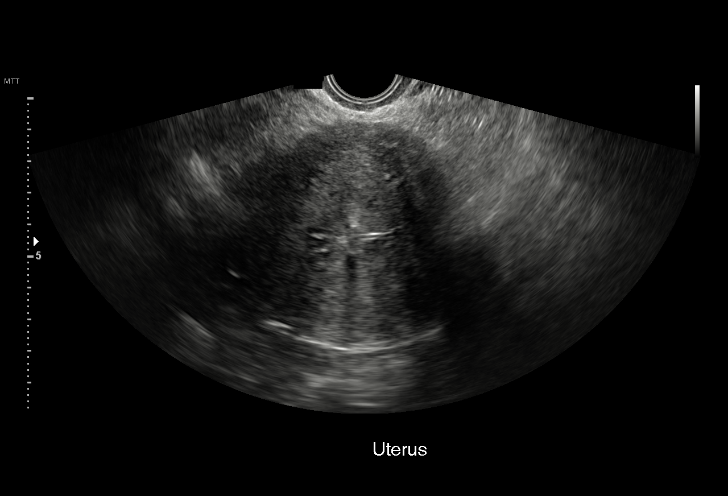
[im 31/73]
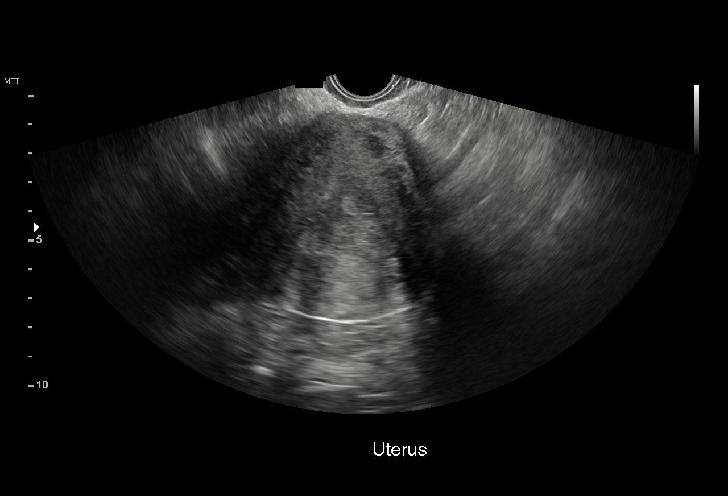
[im 37/73]
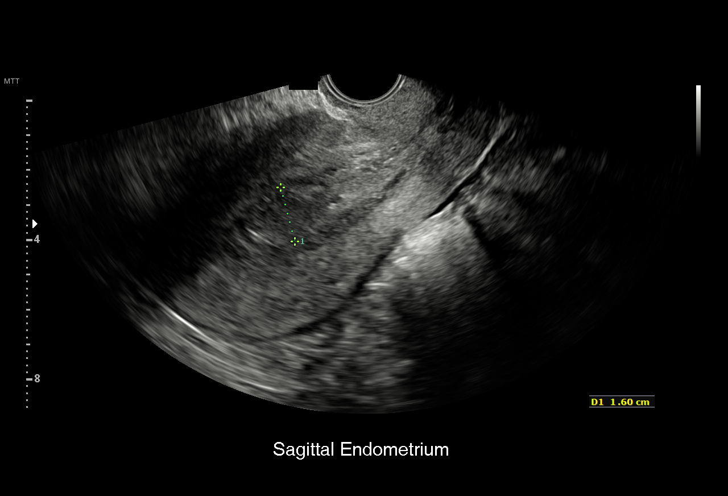
[im 43/73]
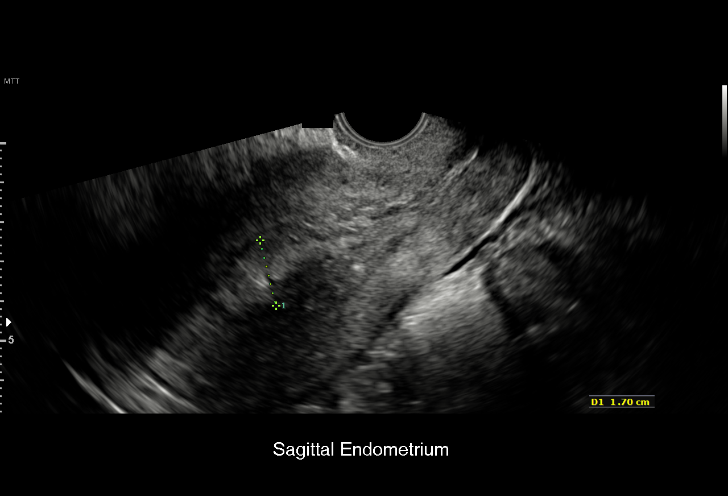
[im 46/73]
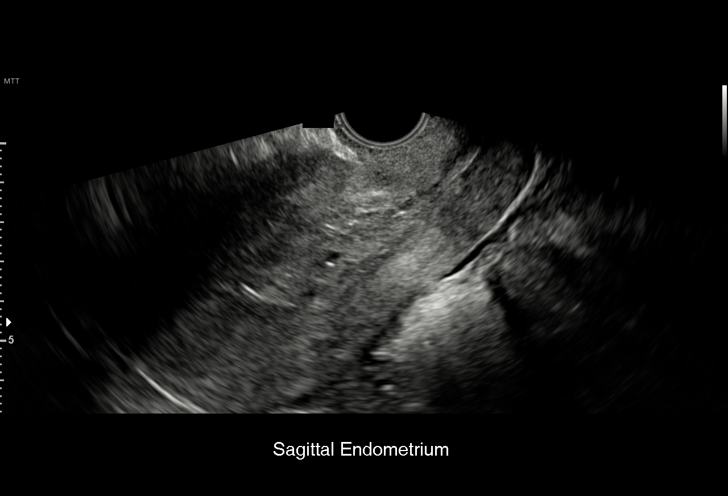
[im 52/73]
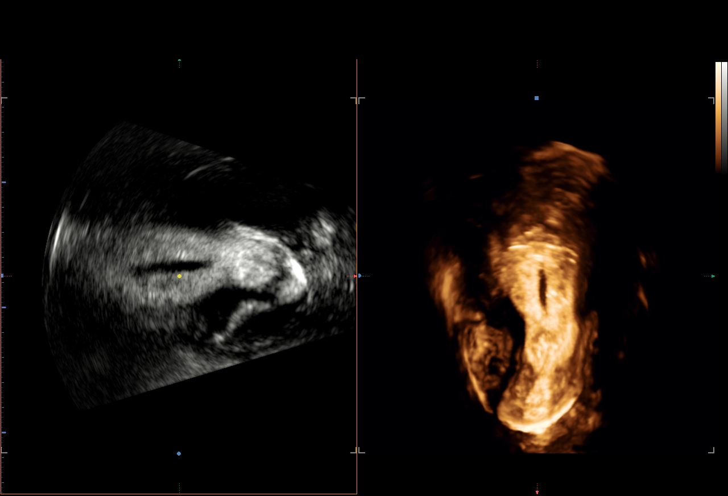
[im 58/73]
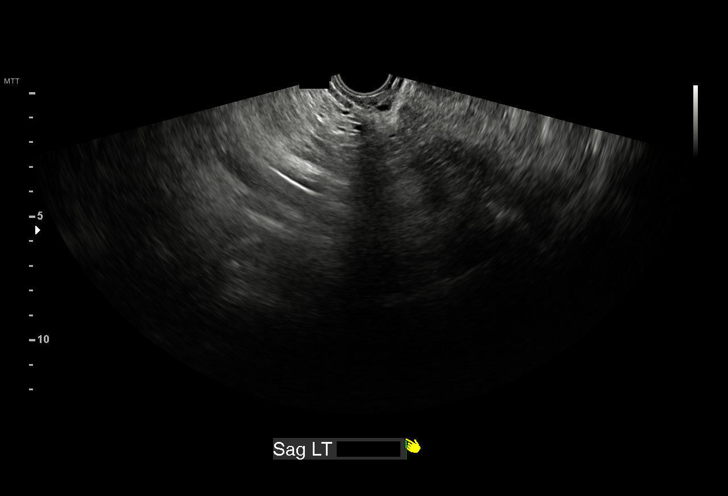
[im 61/73]
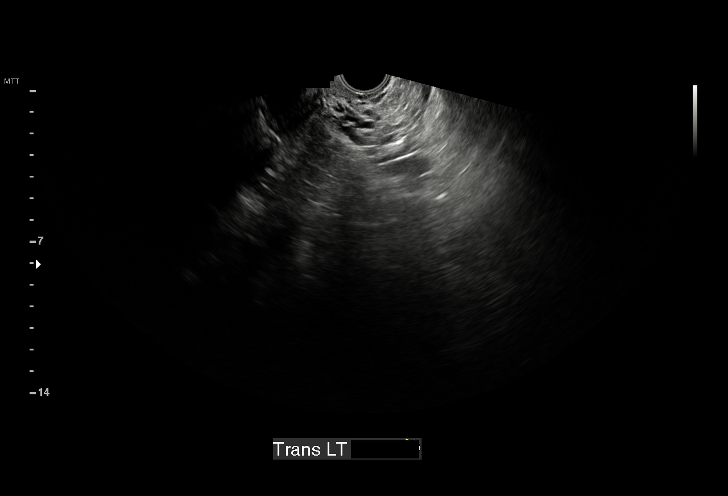
[im 67/73]
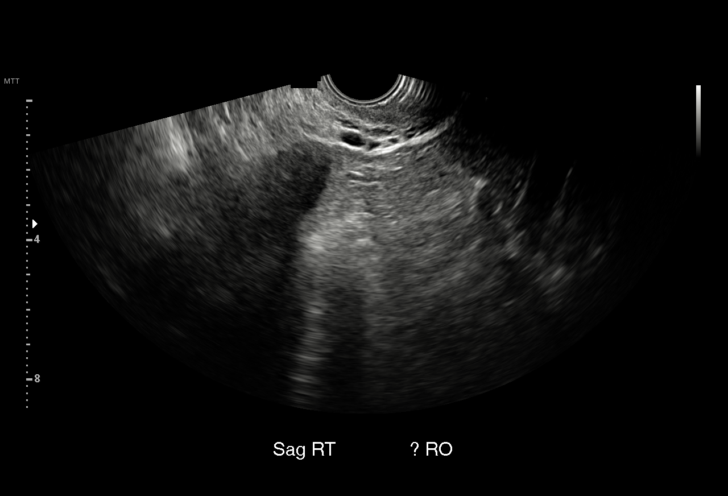
[im 73/73]
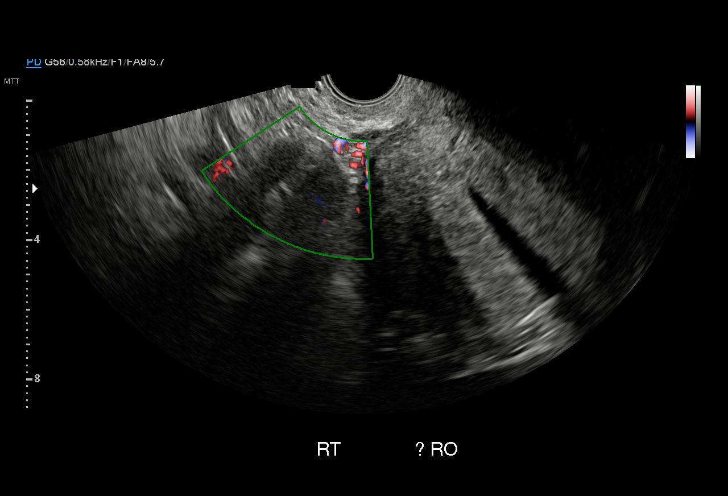

[15 of 25 positions shown; findings below may reference images not displayed]

FINDINGS: Uterus

Measurements: 10.3 x 5.9 x 7.0 cm. No fibroids or other mass
visualized.

Endometrium

Thickness: 7 mm thickness. IUD in place within the endometrium..
Endometrium is heterogeneous with internal vascularity on colour
Imaging.

Right ovary

Measurements: 1.8 x 2.0 x 1.7 cm. Difficult to visualize. No visible
abnormality.

Left ovary

Measurements: Not visualized. No adnexal mass seen.

Other findings:  No abnormal free fluid
IMPRESSION: Thickened, heterogeneous endometrium with internal blood flow. In
the setting of post-menopausal bleeding, endometrial sampling is
indicated to exclude carcinoma. If results are benign,
sonohysterogram should be considered for focal lesion work-up. (Ref:
Radiological Reasoning: Algorithmic Workup of Abnormal Vaginal
Bleeding with Endovaginal Sonography and Sonohysterography. AJR
[U6]; [id number])

IUD in place within the endometrium.

## 2017-01-09 ENCOUNTER — Encounter: Payer: Self-pay | Admitting: Gynecologic Oncology

## 2017-01-09 ENCOUNTER — Ambulatory Visit: Payer: Medicaid Other | Attending: Gynecologic Oncology | Admitting: Gynecologic Oncology

## 2017-01-09 VITALS — BP 158/68 | HR 87 | Temp 99.6°F | Resp 18 | Ht 61.0 in | Wt 348.3 lb

## 2017-01-09 DIAGNOSIS — Z7982 Long term (current) use of aspirin: Secondary | ICD-10-CM | POA: Diagnosis not present

## 2017-01-09 DIAGNOSIS — N938 Other specified abnormal uterine and vaginal bleeding: Secondary | ICD-10-CM | POA: Insufficient documentation

## 2017-01-09 DIAGNOSIS — J449 Chronic obstructive pulmonary disease, unspecified: Secondary | ICD-10-CM | POA: Insufficient documentation

## 2017-01-09 DIAGNOSIS — Z79899 Other long term (current) drug therapy: Secondary | ICD-10-CM | POA: Insufficient documentation

## 2017-01-09 DIAGNOSIS — N8501 Benign endometrial hyperplasia: Secondary | ICD-10-CM | POA: Diagnosis not present

## 2017-01-09 DIAGNOSIS — Z7902 Long term (current) use of antithrombotics/antiplatelets: Secondary | ICD-10-CM | POA: Insufficient documentation

## 2017-01-09 DIAGNOSIS — Z6841 Body Mass Index (BMI) 40.0 and over, adult: Secondary | ICD-10-CM | POA: Insufficient documentation

## 2017-01-09 DIAGNOSIS — Z87891 Personal history of nicotine dependence: Secondary | ICD-10-CM | POA: Diagnosis not present

## 2017-01-09 DIAGNOSIS — I2510 Atherosclerotic heart disease of native coronary artery without angina pectoris: Secondary | ICD-10-CM | POA: Insufficient documentation

## 2017-01-09 DIAGNOSIS — N8502 Endometrial intraepithelial neoplasia [EIN]: Secondary | ICD-10-CM

## 2017-01-09 DIAGNOSIS — Z952 Presence of prosthetic heart valve: Secondary | ICD-10-CM | POA: Diagnosis not present

## 2017-01-09 DIAGNOSIS — I350 Nonrheumatic aortic (valve) stenosis: Secondary | ICD-10-CM | POA: Diagnosis not present

## 2017-01-09 DIAGNOSIS — Z975 Presence of (intrauterine) contraceptive device: Secondary | ICD-10-CM | POA: Diagnosis not present

## 2017-01-09 DIAGNOSIS — Z955 Presence of coronary angioplasty implant and graft: Secondary | ICD-10-CM | POA: Diagnosis not present

## 2017-01-09 DIAGNOSIS — N939 Abnormal uterine and vaginal bleeding, unspecified: Secondary | ICD-10-CM | POA: Diagnosis not present

## 2017-01-09 MED ORDER — MEGESTROL ACETATE 40 MG PO TABS: 80.0000 mg | ORAL_TABLET | Freq: Two times a day (BID) | ORAL | 6 refills | Status: DC

## 2017-01-09 MED FILL — MEGESTROL 40 MG TABLET: 40 | 15 days supply | Qty: 60 | Fill #0

## 2017-01-09 NOTE — Progress Notes (Signed)
GYNECOLOGIC ONCOLOGY RETURN VISIT  REASON FOR VISIT: CAH, bleeding with IUD in place  ASSESSMENT AND PLAN: [name] is a 56 y.o. woman with complex atypical uterine hyperplasia (non-operative candidate) who is s/p IUD insertion on 12/04/16 with increased vaginal bleeding.  Prescribed megace to take in addition to the Mirena IUD to attempt to control bleeding symptoms. The patient reports this was successful in the past. Megace 40BID was prescribed.  Recommend follow-up with Dr Ilda Basset for medical management of CAH.  HPI: [name] is a 56 y.o. morbidly obese (BMI 34) [id number]:[id number] with medical history complicated by severe aortic stenosis s/p valve replacement in 2015, COPD, CHF, and CAD with a drug eluding stent in the right main coronary artery. She is on 3L O2 daily. She presented with PMB which has been ongoing with irregular spotting for a few months. Her PCP ordered a TVUS which was performed in August 2017. It showed a 9.8x4.8x5cm uterus with 25mm EMS. She was referred by her PCP to Dr. Ilda Basset who performed a pap and EMB. This returned with CAH in a polyp. Her pap was normal. Please refer to my initial consult note from 11/05/16 for further details. In brief, she returns today for IUD insertion for management.  INTERVAL HISTORY: Progestin releasing IUD (mirena) was placed on 12/04/16. Since that time, and since being off Megace, she has had increased vaginal bleeding.   PAST MEDICAL/SURGICAL HX: Past Medical History:  Diagnosis Date  . Anemia   . Aortic stenosis   . Aortic valve stenosis, severe   . 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 Asthma   . Blood transfusion    [facility]  . CAD (coronary artery disease)    Cath 2010 with DES x 1 RCA-- PT'S CARDIOLOGIST IS DR. Lumpkin  . Chronic diastolic congestive heart failure ([place])   . COPD (chronic obstructive  pulmonary disease) ([place])   . Depression   . Diabetes mellitus DIAGNOSED IN2010   [facility]  . Eczema    on back  . GERD (gastroesophageal reflux disease)   . Headache(784.0)    h/o migraines  . Heart murmur   . Hyperlipidemia   . Hypertension   . Kidney stones   . Morbid obesity with body mass index of 60.0-69.9 in adult [facility])   . Myocardial infarction    PT THINKS SHE WAS DX WITH MI AT THE TIME OF HEART STENTING  . Neck pain   . Pneumonia 02/08/12   H[facility]PITALIZED AT [facility] WITH PNEUMONIA AND WITH PULMONARY EMBOLUS  . Pulmonary embolism ([place]) 02/08/12   S/P RT TOTAL KNEE ON 02/03/12--ON 02/08/12--DEVELOPED ACUTE SOB AND CHEST PAIN--AND DIAGNOSED WITH  PULMONARY EMBOLUS AND PNEUMONIA  . Restless leg syndrome   . Shortness of breath    with exertion  . Sleep apnea    uses 3 liters O2 at night   . Uterine fibroid    NO PROBLEMS AT PRESENT FROM THE FIBROIDS-STATES SHE IS POST MENOPAUSAL-LAST MENSES 2010 EXCEPT FOR EPISODE THIS YR OF BLEEDING RELATED TO FIBROIDS.  Marland Kitchen Weakness    BOTH HANDS - S/P BILATERAL CARPAL TUNNEL RELEASE--BUT STILL HAS WEAKNESS--OFTEN DROPS THINGS    Past Surgical History:  Procedure Laterality Date  . CARDIAC CATHETERIZATION    . CARPAL TUNNEL RELEASE     Bilateral  . CHOLECYSTECTOMY    . CORONARY  ANGIOPLASTY     2010 has stent in place  . CYSTOSCOPY W/ RETROGRADES Right 09/21/2013   Procedure: CYSTOSCOPY WITH RIGHT RETROGRADE PYELOGRAM RIGHT DOUBLE J STENT ;  Surgeon: Fredricka Bonine, MD;  Location: [facility];  Service: Urology;  Laterality: Right;  . CYSTOSCOPY WITH URETEROSCOPY AND STENT PLACEMENT Right 10/25/2013   Procedure: CYSTOSCOPY RIGHT URETEROSCOPY HOLMIUM LASER LITHO AND STENT PLACEMENT;  Surgeon: Fredricka Bonine, MD;  Location: [facility];  Service: Urology;  Laterality: Right;  . HERNIA REPAIR    . INTRAOPERATIVE TRANSESOPHAGEAL ECHOCARDIOGRAM N/A 12/12/2014   Procedure: INTRAOPERATIVE TRANSESOPHAGEAL ECHOCARDIOGRAM;  Surgeon: Burnell Blanks, MD;  Location: [facility] OR;  Service: Cardiovascular;  Laterality: N/A;  . JOINT REPLACEMENT     bil total knees  . KNEE ARTHROPLASTY  02/03/2012   Procedure: COMPUTER ASSISTED TOTAL KNEE ARTHROPLASTY;  Surgeon: Mcarthur Rossetti, MD;  Location: [place];  Service: Orthopedics;  Laterality: Right;  Right total knee arthroplasty  . LEFT AND RIGHT HEART CATHETERIZATION WITH CORONARY ANGIOGRAM N/A 03/17/2013   Procedure: LEFT AND RIGHT HEART CATHETERIZATION WITH CORONARY ANGIOGRAM;  Surgeon: Burnell Blanks, MD;  Location: [facility] CATH LAB;  Service: Cardiovascular;  Laterality: N/A;  . LEFT AND RIGHT HEART CATHETERIZATION WITH CORONARY/GRAFT ANGIOGRAM N/A 09/14/2014   Procedure: LEFT AND RIGHT HEART CATHETERIZATION WITH Beatrix Fetters;  Surgeon: Burnell Blanks, MD;  Location: [facility] CATH LAB;  Service: Cardiovascular;  Laterality: N/A;  . TEE WITHOUT CARDIOVERSION N/A 03/14/2013   Procedure: TRANSESOPHAGEAL ECHOCARDIOGRAM (TEE);  Surgeon: Lelon Perla, MD;  Location: [facility] ENDOSCOPY;  Service: Cardiovascular;  Laterality: N/A;  . TEE WITHOUT CARDIOVERSION N/A 11/14/2014   Procedure: TRANSESOPHAGEAL ECHOCARDIOGRAM (TEE);  Surgeon: Lelon Perla, MD;  Location: [facility] ENDOSCOPY;  Service: Cardiovascular;  Laterality: N/A;  . TOTAL KNEE ARTHROPLASTY  09/10/2012   Procedure: TOTAL KNEE ARTHROPLASTY;  Surgeon: Mcarthur Rossetti, MD;  Location: [facility];  Service: Orthopedics;  Laterality: Left;  . TOTAL KNEE REVISION Right 07/15/2013   Procedure: REVISION ARTHROPLASTY RIGHT KNEE;  Surgeon: Mcarthur Rossetti, MD;  Location: [facility];  Service: Orthopedics;  Laterality: Right;  . TRANSCATHETER AORTIC VALVE REPLACEMENT, TRANSFEMORAL N/A 12/12/2014   Procedure: TRANSCATHETER AORTIC VALVE REPLACEMENT, TRANSFEMORAL;  Surgeon: Burnell Blanks, MD;  Location: [place];  Service: Cardiovascular;  Laterality: N/A;  . TRIGGER FINGER RELEASE  09/10/2012   Procedure: RELEASE TRIGGER  FINGER/A-1 PULLEY;  Surgeon: Mcarthur Rossetti, MD;  Location: [facility];  Service: Orthopedics;  Laterality: Right;  Right Ring Finger  . TUBAL LIGATION      Family History  Problem Relation Age of Onset  . Breast cancer Mother   . Emphysema Mother     smoked  . Heart disease Mother   . COPD Father     smoked  . Asthma Father   . Heart disease Father   . Cancer Brother     Sinus    Social History   Social History  . Marital status: Married    Spouse name: N/A  . Number of children: 2  . Years of education: N/A   Occupational History  . Disabled    Social History Main Topics  . Smoking status: Former Smoker    Packs/day: 1.50    Years: 30.00    Types: Cigarettes    Quit date: 12/29/2000  . Smokeless tobacco: Never Used  . Alcohol use None     Comment: rarely  . Drug use: Unknown  . Sexual activity: No  Other Topics Concern  . None   Social History Narrative  . None    CURRENT MEDICATIONS:  Current Outpatient Prescriptions:  .  ACCU-CHEK AVIVA PLUS test strip, CHECK GLUCOSE TWICE DAILY Max of THREE TIMES DAILY, Disp: , Rfl: 4 .  acetaminophen (TYLENOL) 160 MG/5ML solution, Take 320 mg by mouth every 6 (six) hours as needed for moderate pain., Disp: , Rfl:  .  albuterol (PROVENTIL HFA;VENTOLIN HFA) 108 (90 BASE) MCG/ACT inhaler, Inhale 2 puffs into the lungs every 4 (four) hours as needed for wheezing or shortness of breath. , Disp: , Rfl:  .  albuterol (PROVENTIL) (2.5 MG/3ML) 0.083% nebulizer solution, Take 3 mLs (2.5 mg total) by nebulization every 4 (four) hours as needed for wheezing or shortness of breath., Disp: 150 mL, Rfl: 0 .  aspirin EC 81 MG tablet, Take 1 tablet (81 mg total) by mouth daily., Disp: 90 tablet, Rfl: 3 .  atorvastatin (LIPITOR) 80 MG tablet, Take 80 mg by mouth daily., Disp: , Rfl:  .  citalopram (CELEXA) 20 MG tablet, Take 20 mg by mouth daily., Disp: , Rfl:  .  clopidogrel (PLAVIX) 75 MG tablet, Take 75 mg by mouth every morning. ,  Disp: , Rfl:  .  ezetimibe (ZETIA) 10 MG tablet, Take 1 tablet (10 mg total) by mouth daily., Disp: 30 tablet, Rfl: 1 .  furosemide (LASIX) 40 MG tablet, Take 40 mg by mouth 2 (two) times daily., Disp: , Rfl:  .  gabapentin (NEURONTIN) 300 MG capsule, Take 900 mg by mouth at bedtime. , Disp: , Rfl:  .  glipiZIDE (GLUCOTROL) 5 MG tablet, Take 2 tablets (10 mg total) by mouth daily before supper., Disp: 60 tablet, Rfl: 0 .  latanoprost (XALATAN) 0.005 % ophthalmic solution, Place 1 drop into both eyes at bedtime., Disp: , Rfl:  .  levonorgestrel (MIRENA) 20 MCG/24HR IUD, 1 Intra Uterine Device (1 each total) by Intrauterine route once., Disp: 1 each, Rfl: 0 .  losartan (COZAAR) 100 MG tablet, Take 100 mg by mouth daily., Disp: , Rfl:  .  losartan (COZAAR) 50 MG tablet, Take 1 tablet (50 mg total) by mouth daily with breakfast. (Patient not taking: Reported on 12/04/2016), Disp: 30 tablet, Rfl: 1 .  megestrol (MEGACE) 40 MG tablet, Take 2 tablets (80 mg total) by mouth 2 (two) times daily., Disp: 60 tablet, Rfl: 6 .  metoprolol tartrate (LOPRESSOR) 25 MG tablet, Take 1 tablet (25 mg total) by mouth 2 (two) times daily., Disp: 180 tablet, Rfl: 3 .  OXYGEN-HELIUM IN, Inhale 3 L into the lungs daily. , Disp: , Rfl:  .  potassium chloride SA (K-DUR,KLOR-CON) 20 MEQ tablet, Take 40 mEq by mouth at bedtime., Disp: , Rfl:  .  SYMBICORT 160-4.5 MCG/ACT inhaler, inhale 2 puffs EVERY TWELVE HOURS, Disp: , Rfl: 3  REVIEW OF SYSTEMS: Complete 10-system review is negative except as above in Interval History.  PHYSICAL EXAM: BP (!) 158/68 (BP Location: Left Wrist, Patient Position: Sitting)   Pulse 87   Temp 99.6 F (37.6 C) (Oral)   Resp 18   Ht 5\' 1"  (1.549 m)   Wt (!) 348 lb 4.8 oz (158 kg)   LMP 02/03/2012   SpO2 97%   BMI 65.81 kg/m  General: Alert, oriented. Chest: Distant breath sounds, clear bilaterally. Cardiovascular: Distant heart sounds with regular rate and rhythm, + systolic murmur. No  rubs or gallops. Abdomen: Morbidly obese. Normoactive bowel sounds.  Soft, nondistended, nontender to palpation.  No  masses or hepatosplenomegaly appreciated.  No fluid wave.   Extremities: 1+ edema bilaterally. Skin: No rashes or lesions. GU: normal EFG, no external lesions. Vagina grossly normal. IUD in place with strings seen. Cervix grossly normal. Uterine size and shape unable to be determined due to body habitus.   Donaciano Eva, MD

## 2017-01-09 NOTE — Patient Instructions (Signed)
No additional follow ups with Dr. Denman George. Please follow up with your GYN office.

## 2017-02-10 ENCOUNTER — Other Ambulatory Visit: Payer: Self-pay | Admitting: Cardiovascular Disease

## 2017-02-10 DIAGNOSIS — E785 Hyperlipidemia, unspecified: Secondary | ICD-10-CM

## 2017-02-13 ENCOUNTER — Encounter: Payer: Self-pay | Admitting: Cardiovascular Disease

## 2017-02-22 NOTE — Progress Notes (Signed)
Chief Complaint  Patient presents with  . Aortic Stenosis    History of Present Illness: 56 yo WF with history of severe aortic stenosis now s/p TAVR,  CAD, OSA, HTN, HLD, obesity, GERD, asthma, COPD, former tobacco abuse, depression here today for cardiac follow up. She had a cardiac cath in February 2010 and had a drug eluting stent (2.35mm Xience) placed in the distal RCA. Her LAD and Circumflex were free of disease. She had her right knee replaced in February 0000000, complicated by a PE. She was started on coumadin and treated for 6 months. CTA chest on 08/09/12 showed no evidence of PE so her coumadin was stopped. I saw her in February 2014 and she c/o severe dyspnea with minimal exertion. I felt that this was multi-factorial. Chest CTA with no PE. I arranged a stress myoview which showed possible ischemia. Echo with severe AS, normal LV function. Cardiac cath on 03/17/13 with mild non-obstructive disease and patent stent RCA. She continued to have severe dyspnea. Most recent cath 09/14/14 with 70% ostial RCA stenosis treated medically and no obstructive disease in the LAD or Circumflex. She underwent TAVR on 12/12/14 and did well. (26 mm Sapien 3 bioprosthetic valve). Echo December 2016 with normally functioning bioprosthetic aortic valve.   She is here today for cardiac follow up.  She is feeling well. Rare sharp chest pains that last for several seconds. She has dyspnea with exertion, unchanged from her baseline.  She is wearing supplemental O2 24/7.  Marland Kitchen  Primary Care Physician: Maggie Font, MD  Past Medical History:  Diagnosis Date  . Anemia   . Aortic stenosis   . Aortic valve stenosis, severe   . 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 Asthma   . Blood transfusion    2013[facility]  . CAD (coronary artery disease)    Cath 2010 with DES x 1 RCA-- PT'S CARDIOLOGIST IS DR. [place]    . Chronic diastolic congestive heart failure ([place])   . COPD (chronic obstructive pulmonary disease) ([place])   . Depression   . Diabetes mellitus DIAGNOSED IN2010   [facility]  . Eczema    on back  . GERD (gastroesophageal reflux disease)   . Headache(784.0)    h/o migraines  . Heart murmur   . Hyperlipidemia   . Hypertension   . Kidney stones   . Morbid obesity with body mass index of 60.0-69.9 in adult [facility])   . Myocardial infarction    PT THINKS SHE WAS DX WITH MI AT THE TIME OF HEART STENTING  . Neck pain   . Pneumonia 02/08/12   HOSPITALIZED AT  [facility] WITH PNEUMONIA AND WITH PULMONARY EMBOLUS  . Pulmonary embolism ([place]) 02/08/12   S/P RT TOTAL KNEE ON 02/03/12--ON 02/08/12--DEVELOPED ACUTE SOB AND CHEST PAIN--AND DIAGNOSED WITH  PULMONARY EMBOLUS AND PNEUMONIA  . Restless leg syndrome   . Shortness of breath    with exertion  . Sleep apnea    uses 3 liters O2 at night   . Uterine fibroid    NO PROBLEMS AT PRESENT FROM THE FIBROIDS-STATES SHE IS POST MENOPAUSAL-LAST MENSES 2010 EXCEPT FOR EPISODE THIS YR OF BLEEDING RELATED TO FIBROIDS.  Marland Kitchen Weakness    BOTH HANDS - S/P BILATERAL CARPAL TUNNEL RELEASE--BUT STILL HAS WEAKNESS--OFTEN DROPS THINGS    Past Surgical History:  Procedure Laterality  Date  . CARDIAC CATHETERIZATION    . CARPAL TUNNEL RELEASE     Bilateral  . CHOLECYSTECTOMY    . CORONARY ANGIOPLASTY     2010 has stent in place  . CYSTOSCOPY W/ RETROGRADES Right 09/21/2013   Procedure: CYSTOSCOPY WITH RIGHT RETROGRADE PYELOGRAM RIGHT DOUBLE J STENT ;  Surgeon: Fredricka Bonine, MD;  Location: [facility];  Service: Urology;  Laterality: Right;  . CYSTOSCOPY WITH URETEROSCOPY AND STENT PLACEMENT Right 10/25/2013   Procedure: CYSTOSCOPY RIGHT URETEROSCOPY HOLMIUM LASER LITHO AND STENT PLACEMENT;  Surgeon: Fredricka Bonine, MD;  Location: [facility];  Service: Urology;  Laterality: Right;  . HERNIA REPAIR    . INTRAOPERATIVE TRANSESOPHAGEAL ECHOCARDIOGRAM N/A 12/12/2014    Procedure: INTRAOPERATIVE TRANSESOPHAGEAL ECHOCARDIOGRAM;  Surgeon: Burnell Blanks, MD;  Location: [facility] OR;  Service: Cardiovascular;  Laterality: N/A;  . JOINT REPLACEMENT     bil total knees  . KNEE ARTHROPLASTY  02/03/2012   Procedure: COMPUTER ASSISTED TOTAL KNEE ARTHROPLASTY;  Surgeon: Mcarthur Rossetti, MD;  Location: [facility];  Service: Orthopedics;  Laterality: Right;  Right total knee arthroplasty  . LEFT AND RIGHT HEART CATHETERIZATION WITH CORONARY ANGIOGRAM N/A 03/17/2013   Procedure: LEFT AND RIGHT HEART CATHETERIZATION WITH CORONARY ANGIOGRAM;  Surgeon: Burnell Blanks, MD;  Location: [facility] CATH LAB;  Service: Cardiovascular;  Laterality: N/A;  . LEFT AND RIGHT HEART CATHETERIZATION WITH CORONARY/GRAFT ANGIOGRAM N/A 09/14/2014   Procedure: LEFT AND RIGHT HEART CATHETERIZATION WITH Beatrix Fetters;  Surgeon: Burnell Blanks, MD;  Location: [facility] CATH LAB;  Service: Cardiovascular;  Laterality: N/A;  . TEE WITHOUT CARDIOVERSION N/A 03/14/2013   Procedure: TRANSESOPHAGEAL ECHOCARDIOGRAM (TEE);  Surgeon: Lelon Perla, MD;  Location: [facility] ENDOSCOPY;  Service: Cardiovascular;  Laterality: N/A;  . TEE WITHOUT CARDIOVERSION N/A 11/14/2014   Procedure: TRANSESOPHAGEAL ECHOCARDIOGRAM (TEE);  Surgeon: Lelon Perla, MD;  Location[facility] ENDOSCOPY;  Service: Cardiovascular;  Laterality: N/A;  . TOTAL KNEE ARTHROPLASTY  09/10/2012   Procedure: TOTAL KNEE ARTHROPLASTY;  Surgeon: Mcarthur Rossetti, MD;  Location: [facility];  Service: Orthopedics;  Laterality: Left;  . TOTAL KNEE REVISION Right 07/15/2013   Procedure: REVISION ARTHROPLASTY RIGHT KNEE;  Surgeon: Mcarthur Rossetti, MD;  Location: [facility];  Service: Orthopedics;  Laterality: Right;  . TRANSCATHETER AORTIC VALVE REPLACEMENT, TRANSFEMORAL N/A 12/12/2014   Procedure: TRANSCATHETER AORTIC VALVE REPLACEMENT, TRANSFEMORAL;  Surgeon: Burnell Blanks, MD;  Location: [place];  Service: Cardiovascular;   Laterality: N/A;  . TRIGGER FINGER RELEASE  09/10/2012   Procedure: RELEASE TRIGGER FINGER/A-1 PULLEY;  Surgeon: Mcarthur Rossetti, MD;  Location: [facility];  Service: Orthopedics;  Laterality: Right;  Right Ring Finger  . TUBAL LIGATION      Current Outpatient Prescriptions  Medication Sig Dispense Refill  . ACCU-CHEK AVIVA PLUS test strip CHECK GLUCOSE TWICE DAILY Max of THREE TIMES DAILY  4  . acetaminophen (TYLENOL) 160 MG/5ML solution Take 320 mg by mouth every 6 (six) hours as needed for moderate pain.    Marland Kitchen albuterol (PROVENTIL HFA;VENTOLIN HFA) 108 (90 BASE) MCG/ACT inhaler Inhale 2 puffs into the lungs every 4 (four) hours as needed for wheezing or shortness of breath.     Marland Kitchen albuterol (PROVENTIL) (2.5 MG/3ML) 0.083% nebulizer solution Take 3 mLs (2.5 mg total) by nebulization every 4 (four) hours as needed for wheezing or shortness of breath. 150 mL 0  . aspirin EC 81 MG tablet Take 1 tablet (81 mg total) by mouth daily. 90 tablet 3  . atorvastatin (  LIPITOR) 80 MG tablet Take 80 mg by mouth daily.    . citalopram (CELEXA) 20 MG tablet Take 20 mg by mouth daily.    . clopidogrel (PLAVIX) 75 MG tablet Take 75 mg by mouth every morning.     . furosemide (LASIX) 40 MG tablet Take 40 mg by mouth 2 (two) times daily.    Marland Kitchen gabapentin (NEURONTIN) 300 MG capsule Take 900 mg by mouth at bedtime.     Marland Kitchen glipiZIDE (GLUCOTROL) 5 MG tablet Take 2 tablets (10 mg total) by mouth daily before supper. 60 tablet 0  . latanoprost (XALATAN) 0.005 % ophthalmic solution Place 1 drop into both eyes at bedtime.    Marland Kitchen losartan (COZAAR) 100 MG tablet Take 100 mg by mouth daily.    . megestrol (MEGACE) 40 MG tablet Take 2 tablets (80 mg total) by mouth 2 (two) times daily. 60 tablet 6  . metoprolol tartrate (LOPRESSOR) 25 MG tablet Take 1 tablet (25 mg total) by mouth 2 (two) times daily. 180 tablet 3  . OXYGEN-HELIUM IN Inhale 3 L into the lungs daily.     . potassium chloride SA (K-DUR,KLOR-CON) 20 MEQ tablet  Take 40 mEq by mouth at bedtime.    . SYMBICORT 160-4.5 MCG/ACT inhaler inhale 2 puffs EVERY TWELVE HOURS  3  . ZETIA 10 MG tablet TAKE 1 TABLET BY MOUTH EVERY DAY 30 tablet 0  . levonorgestrel (MIRENA) 20 MCG/24HR IUD 1 Intra Uterine Device (1 each total) by Intrauterine route once. 1 each 0  . nitroGLYCERIN (NITROSTAT) 0.4 MG SL tablet Place 1 tablet (0.4 mg total) under the tongue every 5 (five) minutes as needed for chest pain. 25 tablet 6   No current facility-administered medications for this visit.     No Known Allergies  Social History   Social History  . Marital status: Married    Spouse name: N/A  . Number of children: 2  . Years of education: N/A   Occupational History  . Disabled    Social History Main Topics  . Smoking status: Former Smoker    Packs/day: 1.50    Years: 30.00    Types: Cigarettes    Quit date: 12/29/2000  . Smokeless tobacco: Never Used  . Alcohol use 0.6 oz/week    1 Cans of beer per week     Comment: rarely  . Drug use: No  . Sexual activity: No   Other Topics Concern  . Not on file   Social History Narrative  . No narrative on file    Family History  Problem Relation Age of Onset  . Breast cancer Mother   . Emphysema Mother     smoked  . Heart disease Mother   . COPD Father     smoked  . Asthma Father   . Heart disease Father   . Cancer Brother     Sinus    Review of Systems:  As stated in the HPI and otherwise negative.   BP 140/80   Pulse 86   Ht 5\' 1"  (1.549 m)   Wt (!) 345 lb (156.5 kg)   LMP 02/03/2012   BMI 65.19 kg/m   Physical Examination: General: Morbidly obese. NAD  HEENT: OP clear, mucus membranes moist  SKIN: warm, dry. No rashes. Neuro: No focal deficits  Musculoskeletal: Muscle strength 5/5 all ext  Psychiatric: Mood and affect normal  Neck: No JVD, no carotid bruits, no thyromegaly, no lymphadenopathy.  Lungs:Clear bilaterally, no wheezes, rhonci, crackles  Cardiovascular: Regular rate and rhythm.  Slight systolic murmur.  Abdomen:Soft. Bowel sounds present. Non-tender.  Extremities: No lower extremity edema. Pulses are 2 + in the bilateral DP/PT.  Cardiac cath 09/14/14: Ao: 136/80  LV: 182/15/28  RA: 6  RV: 50/10/16  PA: 42/28 (mean 35)  PCWP: 23  Fick Cardiac Output: 4.99 L/min  Fick Cardiac Index: 2.1 L/min/m2  Central Aortic Saturation: 97%  Pulmonary Artery Saturation: 59%  Aortic Valve Data: Peak to peak gradient 46 mmHg  Mean gradient 12 mmHg  Angiographic Findings:  Left main: No obstructive disease.  Left Anterior Descending Artery: Large caliber vessel that courses to the apex. There is a moderate caliber diagonal branch. No obstructive disease noted.  Circumflex Artery: Large caliber vessel with two small obtuse marginal branches and a moderate caliber posterolateral branch. No obstructive disease noted.  Right Coronary Artery: Large dominant vessel with 70-80% ostial stenosis. 40% proximal stenosis. Distal stented segment is patent with no restenosis.  Left Ventricular Angiogram: Deferred.    Echo December 2016: Left ventricle: The cavity size was normal. There was mild   concentric hypertrophy. Systolic function was vigorous. The   estimated ejection fraction was in the range of 65% to 70%. Wall   motion was normal; there were no regional wall motion   abnormalities. Doppler parameters are consistent with abnormal   left ventricular relaxation (grade 1 diastolic dysfunction).   Doppler parameters are consistent with elevated ventricular   end-diastolic filling pressure. - Aortic valve: A bioprosthetic Edward-SAPIEN valve is seated well   in the artic position, leaflets are poorly visualized.   There is trivial aortic regurgitation. Transaortic gradients are   elevated with mean 30 mmHg, peak gradient 42 mmHg. Trileaflet;   normal thickness leaflets. - Aortic root: The aortic root was normal in size. - Mitral valve: Calcified annulus. Mildly thickened leaflets  . - Left atrium: The atrium was mildly dilated. - Right atrium: The atrium was normal in size. - Tricuspid valve: There was no regurgitation. - Pulmonary arteries: Systolic pressure was within the normal   range. - Inferior vena cava: The vessel was normal in size. - Pericardium, extracardiac: There was no pericardial effusion.  Impressions:  - A bioprosthetic Edward-SAPIEN valve is seated well in the artic   position, leaflets are poorly visualized.   There is trivial aortic regurgitation. Transaortic gradients are   elevated with mean 30 mmHg, peak gradient 42 mmHg.   The gradients are slightly elevated when compared to the prior   study from 02/07/2015 (mean 25 mmHg, peak 38 mmHg).  EKG:  EKG is ordered today. The ekg ordered today demonstrates NSR, rate 87 bpm.   Recent Labs: No results found for requested labs within last 8760 hours.   Lipid Panel    Component Value Date/Time   CHOL 213 (H) 12/28/2015 1052   TRIG 100 12/28/2015 1052   HDL 58 12/28/2015 1052   CHOLHDL 3.7 12/28/2015 1052   VLDL 20 12/28/2015 1052   LDLCALC 135 (H) 12/28/2015 1052   LDLDIRECT 162.0 10/17/2013 0905     Wt Readings from Last 3 Encounters:  02/23/17 (!) 345 lb (156.5 kg)  01/09/17 (!) 348 lb 4.8 oz (158 kg)  12/04/16 (!) 355 lb (161 kg)     Other studies Reviewed: Additional studies/ records that were reviewed today include: . Review of the above records demonstrates:    Assessment and Plan:   1. Severe Aortic valve stenosis: Now s/p TAVR 12/12/14. Doing well. She is NYHA  class 2. Much of her dyspnea is likely due to her morbid obesity. I have personally reviewed her echo images. Echo with normally functioning aortic valve with elevated gradient, trivial AI. Will continue ASA and Plavix. Will need antibiotic prophylaxis before dental visits.   2. CAD without angina: Cardiac cath September 2015 with moderate ostial RCA stenosis and no disease in the LAD or Circumflex. She is having  no chest pain. Will continue medical management for now. Will continue ASA, Plavix, statin, beta blocker and ARB.   3. Dyspnea: Much improved but still present. I would expect her to have some dyspnea with which is likely multifactorial due to recent pneumonia, morbid obesity, deconditioning, CAD, asthma and chronic thromboembolic disease.   4. H/O Pulmonary embolism: No residual PE on CTA 02/21/13. She has been off of coumadin.   5. Hyperlipidemia: Followed in primary care.    Current medicines are reviewed at length with the patient today.  The patient does not have concerns regarding medicines.  The following changes have been made:  no change  Labs/ tests ordered today include:   Orders Placed This Encounter  Procedures  . EKG 12-Lead    Disposition:   FU with me in 12  months  Signed, Lauree Chandler, MD 02/23/2017 9:42 AM    [facility] [address], [place], [place]  [address] Phone: [phone]; Fax: [phone]

## 2017-02-23 ENCOUNTER — Ambulatory Visit (INDEPENDENT_AMBULATORY_CARE_PROVIDER_SITE_OTHER): Payer: Medicaid Other | Admitting: Cardiovascular Disease

## 2017-02-23 VITALS — BP 140/80 | HR 86 | Ht 61.0 in | Wt 345.0 lb

## 2017-02-23 DIAGNOSIS — I350 Nonrheumatic aortic (valve) stenosis: Secondary | ICD-10-CM

## 2017-02-23 DIAGNOSIS — Z953 Presence of xenogenic heart valve: Secondary | ICD-10-CM | POA: Diagnosis not present

## 2017-02-23 DIAGNOSIS — E7800 Pure hypercholesterolemia, unspecified: Secondary | ICD-10-CM | POA: Diagnosis not present

## 2017-02-23 DIAGNOSIS — I2510 Atherosclerotic heart disease of native coronary artery without angina pectoris: Secondary | ICD-10-CM | POA: Diagnosis not present

## 2017-02-23 MED ORDER — NITROGLYCERIN 0.4 MG SL SUBL: 0.4000 mg | SUBLINGUAL_TABLET | SUBLINGUAL | 6 refills | Status: DC | PRN

## 2017-02-23 NOTE — Patient Instructions (Signed)

## 2017-03-23 ENCOUNTER — Encounter: Payer: Self-pay | Admitting: General Practice

## 2017-04-01 ENCOUNTER — Telehealth: Payer: Self-pay

## 2017-04-01 NOTE — Telephone Encounter (Signed)
Per [place] [employer] the pts medicare insurance ended on 03/28/17.  The pt states that she has a whole unopened NTG bottle with her and rarely uses it. She states that she does not need NTG at this time and she is aware that her insurance has ended.

## 2017-04-09 ENCOUNTER — Ambulatory Visit: Payer: Medicaid Other | Admitting: Obstetrics and Gynecology

## 2017-04-09 ENCOUNTER — Encounter: Payer: Self-pay | Admitting: Obstetrics and Gynecology

## 2017-04-09 NOTE — Progress Notes (Signed)
Patient did not keep GYN appointment for 04/09/2017. Will ask clinic to contact patient for repeat scheduling.   Durene Romans MD Attending Center for [employer] manager)

## 2017-04-13 ENCOUNTER — Encounter: Payer: Self-pay | Admitting: General Practice

## 2017-04-21 ENCOUNTER — Telehealth: Payer: Self-pay

## 2017-04-21 ENCOUNTER — Encounter: Payer: Self-pay | Admitting: *Deleted

## 2017-04-21 NOTE — Telephone Encounter (Signed)
Told [name] that Dr. Denman George said that she needed to call Dr.Pickens.  The IUD can be inserted by him. Pt appreciated the phone call.

## 2018-01-12 ENCOUNTER — Other Ambulatory Visit: Payer: Self-pay

## 2018-01-12 ENCOUNTER — Emergency Department (HOSPITAL_COMMUNITY)
Admission: EM | Admit: 2018-01-12 | Discharge: 2018-01-12 | Disposition: A | Discharge status: Home / Self Care | Payer: Self-pay | Attending: Emergency Medicine | Admitting: Emergency Medicine

## 2018-01-12 ENCOUNTER — Emergency Department (HOSPITAL_COMMUNITY): Payer: Self-pay

## 2018-01-12 ENCOUNTER — Encounter (HOSPITAL_COMMUNITY): Payer: Self-pay | Admitting: Emergency Medicine

## 2018-01-12 DIAGNOSIS — I5033 Acute on chronic diastolic (congestive) heart failure: Secondary | ICD-10-CM | POA: Insufficient documentation

## 2018-01-12 DIAGNOSIS — N390 Urinary tract infection, site not specified: Secondary | ICD-10-CM | POA: Insufficient documentation

## 2018-01-12 DIAGNOSIS — R109 Unspecified abdominal pain: Secondary | ICD-10-CM

## 2018-01-12 DIAGNOSIS — Z7984 Long term (current) use of oral hypoglycemic drugs: Secondary | ICD-10-CM | POA: Insufficient documentation

## 2018-01-12 DIAGNOSIS — I110 Hypertensive heart disease with heart failure: Secondary | ICD-10-CM | POA: Insufficient documentation

## 2018-01-12 DIAGNOSIS — Z87891 Personal history of nicotine dependence: Secondary | ICD-10-CM | POA: Insufficient documentation

## 2018-01-12 DIAGNOSIS — J45909 Unspecified asthma, uncomplicated: Secondary | ICD-10-CM | POA: Insufficient documentation

## 2018-01-12 DIAGNOSIS — J449 Chronic obstructive pulmonary disease, unspecified: Secondary | ICD-10-CM | POA: Insufficient documentation

## 2018-01-12 DIAGNOSIS — Z96653 Presence of artificial knee joint, bilateral: Secondary | ICD-10-CM | POA: Insufficient documentation

## 2018-01-12 DIAGNOSIS — E119 Type 2 diabetes mellitus without complications: Secondary | ICD-10-CM | POA: Insufficient documentation

## 2018-01-12 DIAGNOSIS — Z7902 Long term (current) use of antithrombotics/antiplatelets: Secondary | ICD-10-CM | POA: Insufficient documentation

## 2018-01-12 DIAGNOSIS — Z79899 Other long term (current) drug therapy: Secondary | ICD-10-CM | POA: Insufficient documentation

## 2018-01-12 DIAGNOSIS — Z9049 Acquired absence of other specified parts of digestive tract: Secondary | ICD-10-CM | POA: Insufficient documentation

## 2018-01-12 DIAGNOSIS — I2510 Atherosclerotic heart disease of native coronary artery without angina pectoris: Secondary | ICD-10-CM | POA: Insufficient documentation

## 2018-01-12 DIAGNOSIS — Z7982 Long term (current) use of aspirin: Secondary | ICD-10-CM | POA: Insufficient documentation

## 2018-01-12 DIAGNOSIS — F329 Major depressive disorder, single episode, unspecified: Secondary | ICD-10-CM | POA: Insufficient documentation

## 2018-01-12 LAB — URINALYSIS, ROUTINE W REFLEX MICROSCOPIC
BILIRUBIN URINE: NEGATIVE
GLUCOSE, UA: NEGATIVE mg/dL
HGB URINE DIPSTICK: NEGATIVE
KETONES UR: NEGATIVE mg/dL
NITRITE: NEGATIVE
PROTEIN: NEGATIVE mg/dL
Specific Gravity, Urine: 1.028 (ref 1.005–1.030)
pH: 5 (ref 5.0–8.0)

## 2018-01-12 LAB — BASIC METABOLIC PANEL
Anion gap: 10 (ref 5–15)
BUN: 19 mg/dL (ref 6–20)
CALCIUM: 9 mg/dL (ref 8.9–10.3)
CO2: 30 mmol/L (ref 22–32)
CREATININE: 0.86 mg/dL (ref 0.44–1.00)
Chloride: 97 mmol/L — ABNORMAL LOW (ref 101–111)
GFR calc Af Amer: >60 mL/min (ref >60)
GLUCOSE: 159 mg/dL — AB (ref 65–99)
Potassium: 4.8 mmol/L (ref 3.5–5.1)
SODIUM: 137 mmol/L (ref 135–145)

## 2018-01-12 LAB — CBG MONITORING, ED: GLUCOSE-CAPILLARY: 125 mg/dL — AB (ref 65–99)

## 2018-01-12 LAB — CBC
HCT: 38.6 % (ref 36.0–46.0)
Hemoglobin: 12.1 g/dL (ref 12.0–15.0)
MCH: 25.4 pg — ABNORMAL LOW (ref 26.0–34.0)
MCHC: 31.3 g/dL (ref 30.0–36.0)
MCV: 80.9 fL (ref 78.0–100.0)
PLATELETS: 223 10*3/uL (ref 150–400)
RBC: 4.77 MIL/uL (ref 3.87–5.11)
RDW: 15 % (ref 11.5–15.5)
WBC: 6.9 10*3/uL (ref 4.0–10.5)

## 2018-01-12 LAB — PREGNANCY, URINE: Preg Test, Ur: NEGATIVE

## 2018-01-12 IMAGING — CT CT RENAL STONE PROTOCOL
2 of 4 series · 16 of 46 positions shown, 18 images · non-contrast
Comparison: None.

CLINICAL DATA: Recurrent right flank pain.

EXAM:
CT ABDOMEN AND PELVIS WITHOUT CONTRAST
TECHNIQUE: Multidetector CT imaging of the abdomen and pelvis was performed
following the standard protocol without IV contrast.

[Series 3: renal stone 5.0 · axial · 0.98mm/px · z∈[+881,+1346]mm · 13 of 103 slices shown, 15 images]
[im 5/103  soft-tissue]
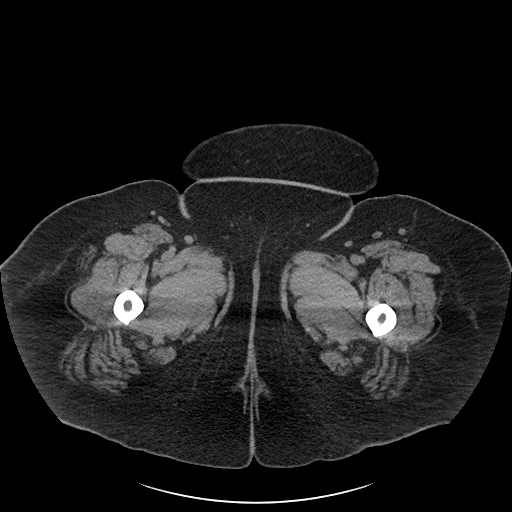
[im 5/103  bone]
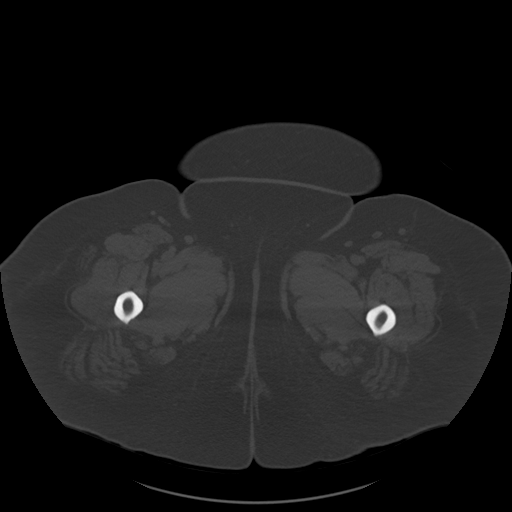
[im 13/103  soft-tissue]
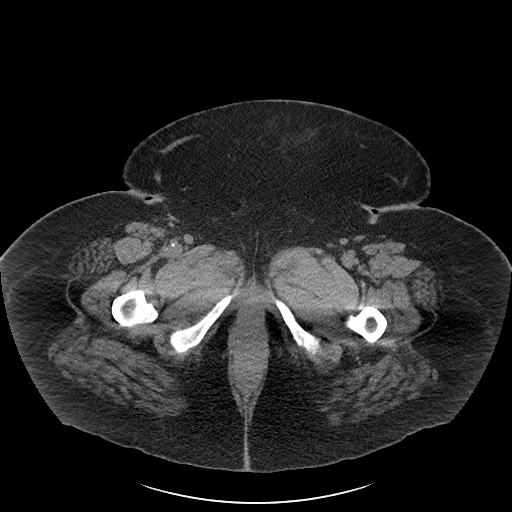
[im 22/103  soft-tissue]
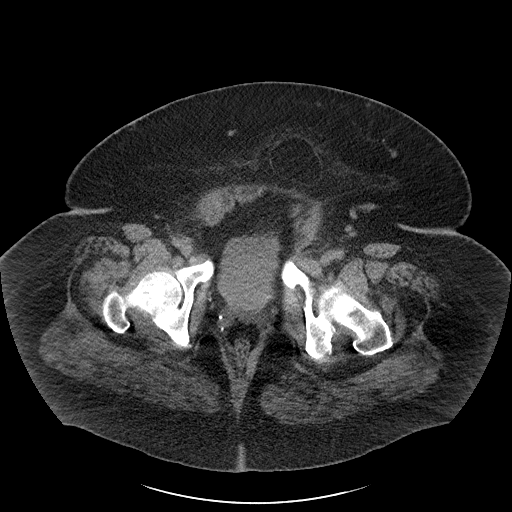
[im 30/103  soft-tissue]
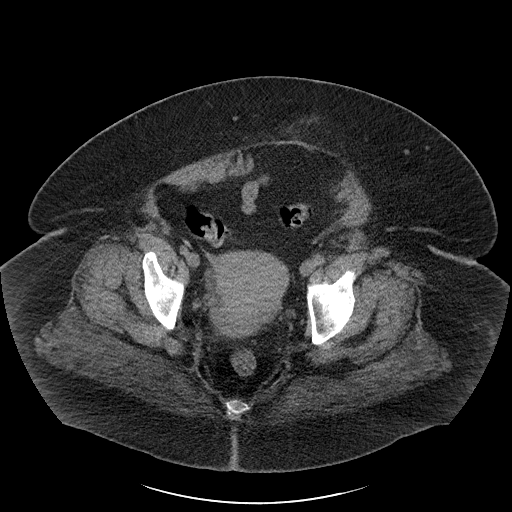
[im 35/103  soft-tissue]
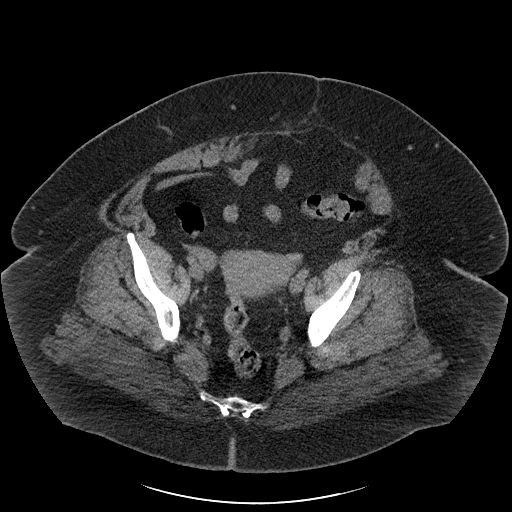
[im 43/103  soft-tissue]
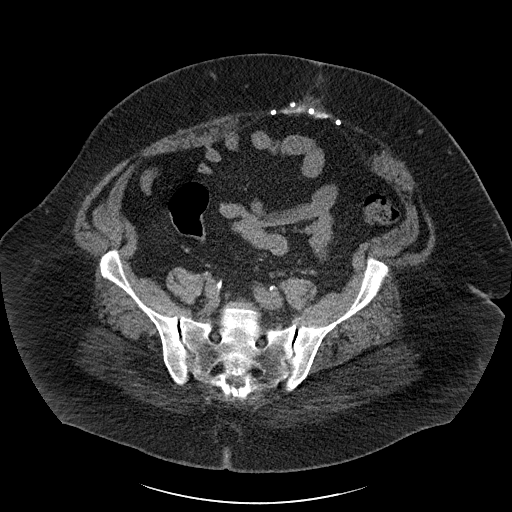
[im 52/103  soft-tissue]
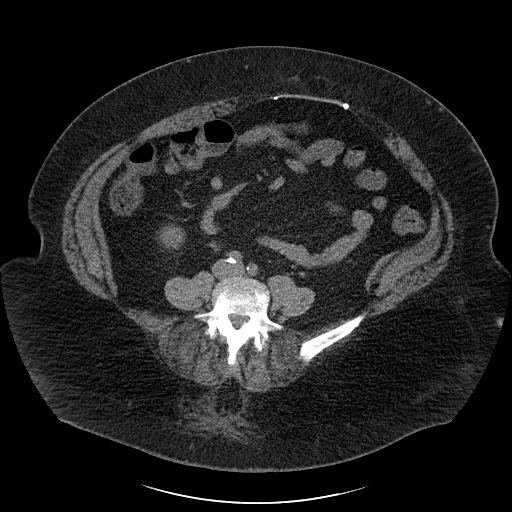
[im 60/103  soft-tissue]
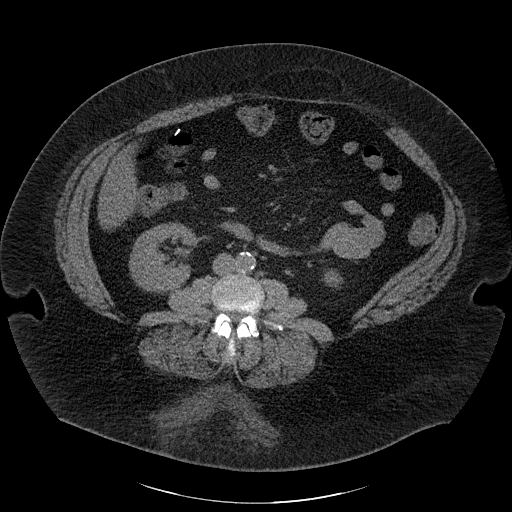
[im 69/103  soft-tissue]
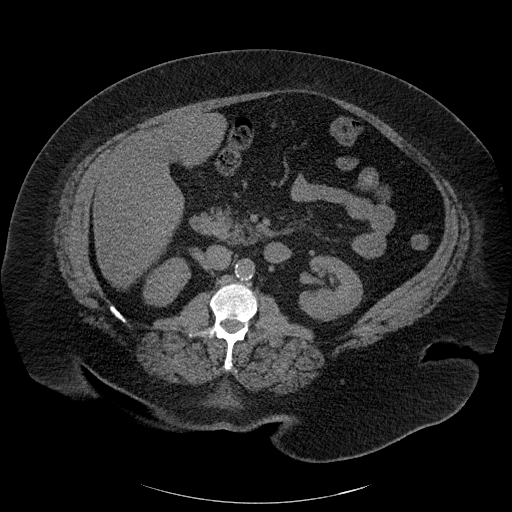
[im 69/103  bone]
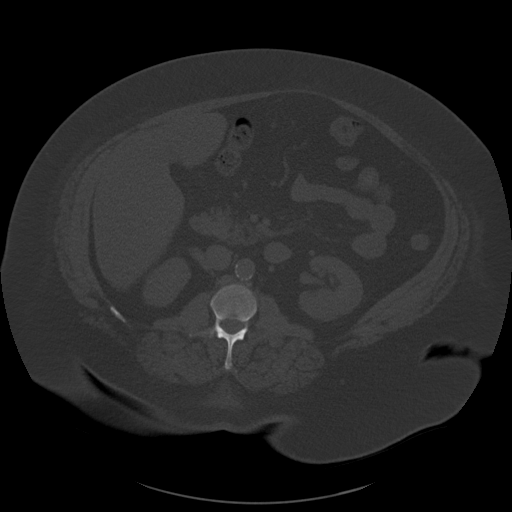
[im 73/103  soft-tissue]
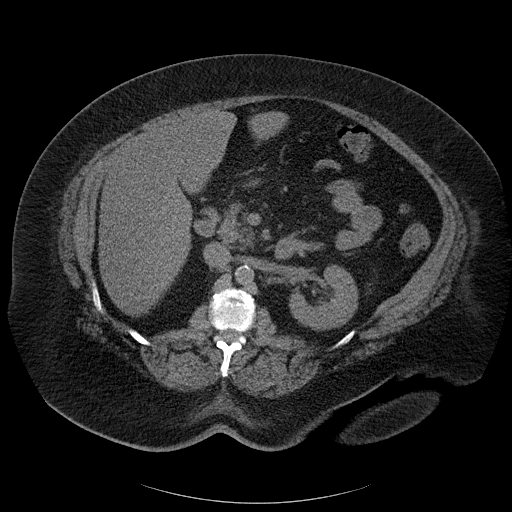
[im 81/103  soft-tissue]
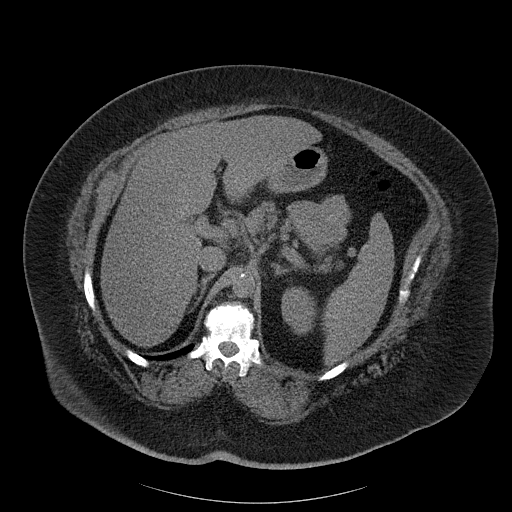
[im 90/103  soft-tissue]
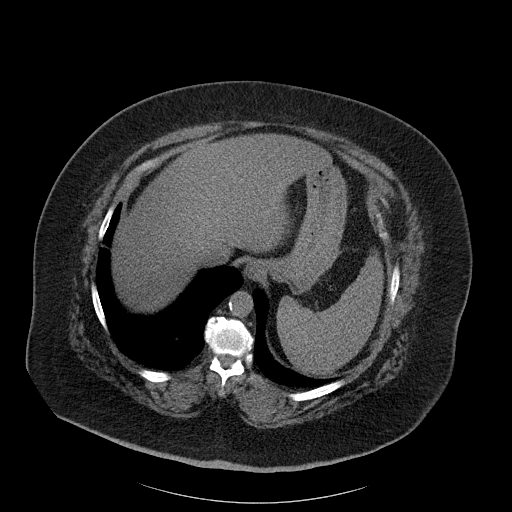
[im 98/103  soft-tissue]
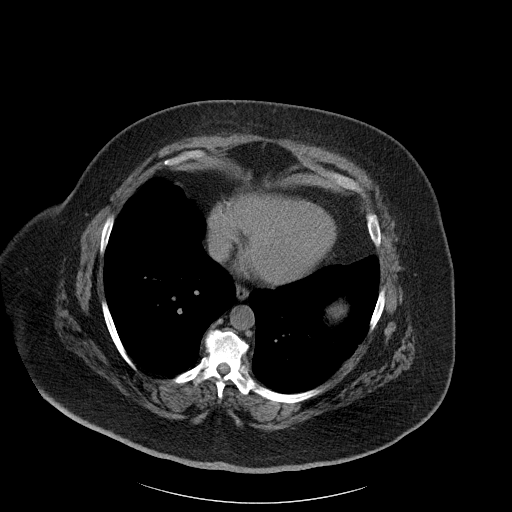

[Series 6: renal stone 3.0 cor · coronal · 0.99mm/px · 3 of 139 slices shown]
[im 47/139  soft-tissue]
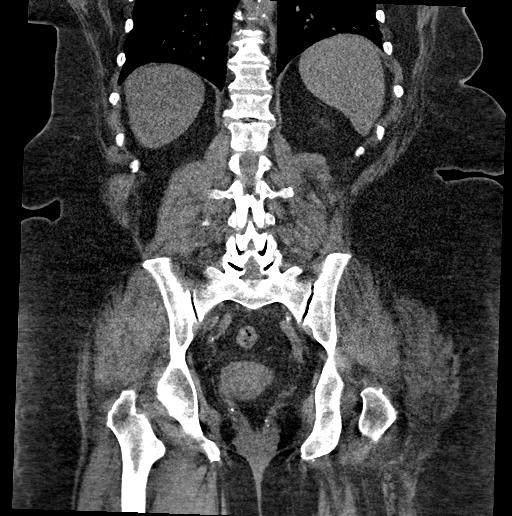
[im 62/139  soft-tissue]
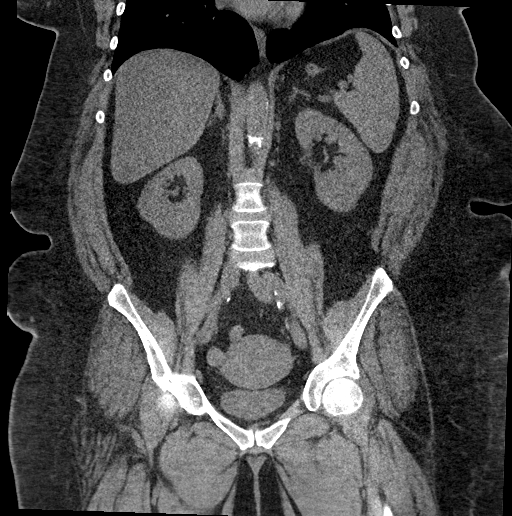
[im 77/139  soft-tissue]
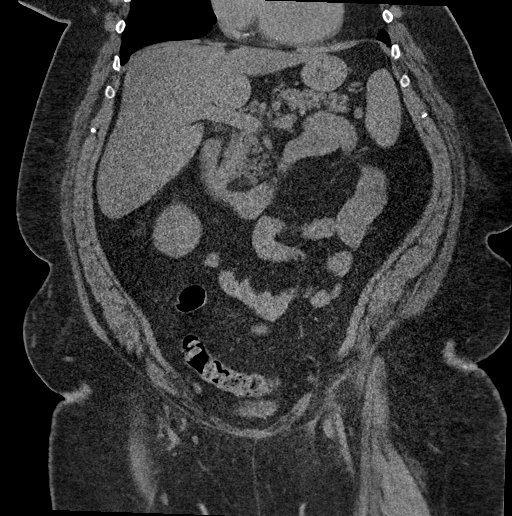

[16 of 46 positions shown; findings below may reference images not displayed]

FINDINGS: Lower chest: No acute abnormality. RCA coronary artery
atherosclerosis. Mild left basilar atelectasis.

Hepatobiliary: No focal liver abnormality is seen. Status post
cholecystectomy. No biliary dilatation.

Pancreas: Unremarkable. No pancreatic ductal dilatation or
surrounding inflammatory changes.

Spleen: Normal in size without focal abnormality.

Adrenals/Urinary Tract: Adrenal glands are unremarkable. Kidneys are
normal, without renal calculi, focal lesion, or hydronephrosis.
Bladder is decompressed.

Stomach/Bowel: Stomach is within normal limits. No evidence of bowel
wall thickening, distention, or inflammatory changes. No
pneumatosis, pneumoperitoneum or portal venous gas.

Vascular/Lymphatic: Abdominal aortic atherosclerosis. Normal caliber
abdominal aorta. No lymphadenopathy.

Reproductive: Status post hysterectomy. No adnexal masses.

Other: Fat containing infraumbilical hernia. No inguinal hernia.
Prior supraumbilical hernia mesh repair. Along the superior margin
of the supraumbilical hernia repair mesh there is a fat containing
hernia.

Musculoskeletal: No acute osseous abnormality. No aggressive osseous
lesion. Degenerative disc disease with disc height loss at T8-T9,
T9-10 and T12-L1. Mild osteoarthritis of bilateral sacroiliac
joints. Severe bilateral facet arthropathy at L4-5 and L5-S1.
IMPRESSION: 1. No urolithiasis or obstructive uropathy.
2. Fat containing infraumbilical hernia. Prior supraumbilical hernia
mesh repair. Along the superior margin of the supraumbilical hernia
repair mesh there is a fat containing hernia.
3.  Aortic Atherosclerosis ([SR]-[SR]).
4. Lumbar spine spondylosis.

## 2018-01-12 MED ORDER — SODIUM CHLORIDE 0.9 % IV BOLUS (SEPSIS): 1000.0000 mL | Freq: Once | INTRAVENOUS | Status: DC

## 2018-01-12 MED ORDER — CEPHALEXIN 250 MG PO CAPS
500.0000 mg | ORAL_CAPSULE | Freq: Once | ORAL | Status: AC
Start: 2018-01-12 — End: 2018-01-12
  Administered 2018-01-12: 500 mg via ORAL
  Filled 2018-01-12: qty 2

## 2018-01-12 MED ORDER — FENTANYL CITRATE (PF) 100 MCG/2ML IJ SOLN
50.0000 ug | Freq: Once | INTRAMUSCULAR | Status: AC
  Administered 2018-01-12: 50 ug via INTRAVENOUS
  Filled 2018-01-12: qty 2

## 2018-01-12 MED ORDER — CEPHALEXIN 500 MG PO CAPS: 500.0000 mg | ORAL_CAPSULE | Freq: Four times a day (QID) | ORAL | 0 refills | Status: AC

## 2018-01-12 MED ORDER — METHOCARBAMOL 500 MG PO TABS: 500.0000 mg | ORAL_TABLET | Freq: Two times a day (BID) | ORAL | 0 refills | Status: DC

## 2018-01-12 MED ORDER — SODIUM CHLORIDE 0.9 % IV BOLUS (SEPSIS)
500.0000 mL | Freq: Once | INTRAVENOUS | Status: AC
  Administered 2018-01-12: 500 mL via INTRAVENOUS

## 2018-01-12 NOTE — ED Notes (Signed)
Patient refused discharge vitals.

## 2018-01-12 NOTE — Discharge Instructions (Signed)
Please see the information and instructions below regarding your visit.  Your diagnoses today include:  1. Flank pain    About diagnosis. Most episodes of acute low back pain are self-limited. Your exam was reassuring today that the source of your pain is not affecting the spinal cord and nerves that originate in the spinal cord.   If you have a history of disc herniation or arthritis in your spine, the nerves exiting the spine on one side get inflamed. This can cause severe pain. We call this radiculopathy. We do not always know what causes the sudden inflammation.  Tests performed today include: See side panel of your discharge paperwork for testing performed today. Vital signs are listed at the bottom of these instructions.   Medications prescribed:    Take any prescribed medications only as prescribed, and any over the counter medications only as directed on the packaging.  Home care instructions:   Low back pain gets worse the longer you stay stationary. Please keep moving and walking as tolerated. There are exercises included in this packet to perform as tolerated for your low back pain.   Apply heat to the areas that are painful. Avoid twisting or bending your trunk to lift something. Do not lift anything above 25 lbs while recovering from this flare of low back pain.  Please follow any educational materials contained in this packet.   Please ensure that you are wiping front to back when he go to the bathroom to make sure that you do not get any further urinary contamination.  Follow-up instructions: Please follow-up with your primary care provider as soon as possible for further evaluation of your symptoms if they are not completely improved.   Return instructions:  Please return to the Emergency Department if you experience worsening symptoms.  Please return for any fever or chills in the setting of your back pain, weakness in the muscles of the legs, numbness in your legs and  feet that is new or changing, numbness in the area where you wipe, retention of your urine, loss of bowel or bladder control, or problems with walking. Please return if you have any fevers or chills, nausea or vomiting the site that you cannot keep anything down, or worsening flank pain. Please return if you have any other emergent concerns.  Additional Information:   Your vital signs today were: BP (!) 143/55    Pulse 72    Temp 97.6 F (36.4 C) (Oral)    Resp (!) 22    Ht 5\' 2"  (1.575 m)    Wt (!) 147 kg (324 lb)    LMP 02/03/2012    SpO2 99%    BMI 59.26 kg/m  If your blood pressure (BP) was elevated on multiple readings during this visit above 130 for the top number or above 80 for the bottom number, please have this repeated by your primary care provider within one month. --------------  Thank you for allowing [place] to participate in your care today.

## 2018-01-12 NOTE — ED Notes (Signed)
Patient reports she does not have home 7 RN ask if patient needed to wait until family could go home and bring o2 patient refused. Patient discharged with son.

## 2018-01-12 NOTE — Discharge Planning (Signed)
[facility] consulted  for medication assistance.EDCM reviewed chart review information and spoke with the pt about [facility] MATCH program ($3 co pay for each Rx through [facility] program, does not include refills, 7 day expiration of [place] letter and choice of pharmacies). Pt is eligible for [facility] MATCH program (unable to find pt listed in PROCARE per cardholder name inquiry) and has agreed to accept [place] under terms discussed. PROCARE information entered. [place] letter completed and provided to pt.  EDCM updated EDP and ED RN.

## 2018-01-12 NOTE — ED Triage Notes (Signed)
Pt reports R flank pain radiating to front for a couple days, states now its impacting her sleep. States home vicodin not effective for pain. Oxygen dependent on 2L [place]. Denies urinary s/s, N/V and diarrhea

## 2018-01-12 NOTE — ED Provider Notes (Signed)
[facility] EMERGENCY DEPARTMENT Provider Note   CSN: [id number] Arrival date & time: 01/12/18  0346     History   Chief Complaint Chief Complaint  Patient presents with  . Flank Pain    HPI [name] is a 56 y.o. female.  HPI  Patient is a 57 year old female with an abdominal surgical history significant for cholecystectomy and tubal ligation and a history of type 2 diabetes mellitus, COPD, asthma, aortic valve placement, hypertension, hyperlipidemia, and CAD presenting for right flank pain for 1 week.  Patient reports that the pain is constant, but alternates between dull and achy sensations.  Sometimes it is sharp and colicky in nature.  The patient reports nausea without vomiting, but has not had any fever or chills.  Patient denies dysuria, increasing urinary urgency or frequency.  Patient reports she is incontinent of urine at times at baseline.  Patient denies any increased vaginal discharge or vaginal bleeding.  Last bowel movement yesterday normal for patient.  No diarrhea or constipation.  No melena or hematochezia.  Patient denies chest pain, shortness of breath, or increasing lower extremity edema.  Sitting up and remaining stationary improves the pain.  Patient reports she has tried home indomethacin and hydrocodone without full relief.  Patient does note that she had a fall approximately 2 weeks ago, however she fell onto her left side and her right side was unaffected, and this pain arose  several days after this incident.  Patient does have a history of kidney stones, however reports this is not similar to her prior episodes.   Past Medical History:  Diagnosis Date  . Anemia   . Aortic stenosis   . Aortic valve stenosis, severe   . 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 Asthma   . Blood transfusion    [facility]  . CAD (coronary artery  disease)    Cath 2010 with DES x 1 RCA-- PT'S CARDIOLOGIST IS DR. [place]  . Chronic diastolic congestive heart failure ([place])   . COPD (chronic obstructive pulmonary disease) ([place])   . Depression   . Diabetes mellitus DIAGNOSED IN2010   [facility]  . Eczema    on back  . GERD (gastroesophageal reflux disease)   . Headache(784.0)    h/o migraines  . Heart murmur   . Hyperlipidemia   . Hypertension   . Kidney stones   . Morbid obesity with body mass index of 60.0-69.9 in adult [facility])   . Myocardial infarction ([place])    PT THINKS SHE WAS DX WITH MI AT THE TIME OF HEART STENTING  . Neck pain   . Pneumonia 02/08/12   HOSPITALIZED AT [facility] WITH PNEUMONIA AND WITH PULMONARY EMBOLUS  . Pulmonary embolism ([place]) 02/08/12   S/P RT TOTAL KNEE ON 02/03/12--ON 02/08/12--DEVELOPED ACUTE SOB AND CHEST PAIN--AND DIAGNOSED WITH  PULMONARY EMBOLUS AND PNEUMONIA  . Restless leg syndrome   . Shortness of breath    with exertion  . Sleep apnea    uses 3 liters O2 at night   . Uterine fibroid    NO PROBLEMS AT PRESENT FROM THE FIBROIDS-STATES SHE IS POST MENOPAUSAL-LAST MENSES 2010 EXCEPT FOR EPISODE THIS YR OF BLEEDING RELATED TO FIBROIDS.  Marland Kitchen Weakness    BOTH HANDS - S/P BILATERAL CARPAL TUNNEL RELEASE--BUT STILL HAS WEAKNESS--OFTEN DROPS THINGS    Patient  Active Problem List   Diagnosis Date Noted  . 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 Depression 08/06/2015  . Esophageal reflux   . Morbid obesity ([place])   . Acute on chronic respiratory failure with hypoxia ([place])   . COPD exacerbation ([place]) 06/14/2015  . Acute bronchitis 06/14/2015  . 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 Acute on chronic diastolic CHF (congestive heart failure), NYHA class 2 ([place])   . Essential hypertension   . Severe aortic valve stenosis 12/12/2014  . Aortic stenosis   . Morbid obesity with body mass index of 60.0-69.9 in adult [facility])   . Chronic diastolic congestive  heart failure ([place])   . Acute pyelonephritis 09/23/2013  . Leukocytosis, unspecified 09/22/2013  . Hydronephrosis, right 09/22/2013  . COPD (chronic obstructive pulmonary disease) ([place]) 09/21/2013  . E. coli UTI 09/21/2013  . OSA (obstructive sleep apnea) 09/21/2013  . Nephrolithiasis 09/21/2013  . Nocturnal hypoxemia 09/12/2013  . Failed total knee, right ([place]) 07/15/2013  . Abnormal CT scan, chest 05/12/2013  . Hyperlipidemia 11/30/2012  . Degenerative arthritis of left knee 09/10/2012  . Pulmonary embolism ([place]) 07/09/2012  .   04/08/2012  . Long term (current) use of anticoagulants 02/20/2012  . Hematuria 02/15/2012  . PE (pulmonary embolism) 02/08/2012  . Anemia 02/08/2012  . Diabetes mellitus without complication ([place]) 92/42/6834  . Asthma 02/08/2012  . Hypokalemia 02/08/2012  . GERD (gastroesophageal reflux disease)   . Degenerative arthritis of right knee 02/03/2012  . CAD (coronary artery disease) 12/08/2011  . Dyspnea 12/08/2011    Past Surgical History:  Procedure Laterality Date  . CARDIAC CATHETERIZATION    . CARPAL TUNNEL RELEASE     Bilateral  . CHOLECYSTECTOMY    . CORONARY ANGIOPLASTY     2010 has stent in place  . CYSTOSCOPY W/ RETROGRADES Right 09/21/2013   Procedure: CYSTOSCOPY WITH RIGHT RETROGRADE PYELOGRAM RIGHT DOUBLE J STENT ;  Surgeon: Fredricka Bonine, MD;  Location: [facility];  Service: Urology;  Laterality: Right;  . CYSTOSCOPY WITH URETEROSCOPY AND STENT PLACEMENT Right 10/25/2013   Procedure: CYSTOSCOPY RIGHT URETEROSCOPY HOLMIUM LASER LITHO AND STENT PLACEMENT;  Surgeon: Fredricka Bonine, MD;  Location: [facility];  Service: Urology;  Laterality: Right;  . HERNIA REPAIR    . INTRAOPERATIVE TRANSESOPHAGEAL ECHOCARDIOGRAM N/A 12/12/2014   Procedure: INTRAOPERATIVE TRANSESOPHAGEAL ECHOCARDIOGRAM;  Surgeon: Burnell Blanks, MD;  Location: [facility] OR;  Service: Cardiovascular;  Laterality: N/A;  . JOINT REPLACEMENT     bil total knees  .  KNEE ARTHROPLASTY  02/03/2012   Procedure: COMPUTER ASSISTED TOTAL KNEE ARTHROPLASTY;  Surgeon: Mcarthur Rossetti, MD;  Location: [facility];  Service: Orthopedics;  Laterality: Right;  Right total knee arthroplasty  . LEFT AND RIGHT HEART CATHETERIZATION WITH CORONARY ANGIOGRAM N/A 03/17/2013   Procedure: LEFT AND RIGHT HEART CATHETERIZATION WITH CORONARY ANGIOGRAM;  Surgeon: Burnell Blanks, MD;  Location: [facility] CATH LAB;  Service: Cardiovascular;  Laterality: N/A;  . LEFT AND RIGHT HEART CATHETERIZATION WITH CORONARY/GRAFT ANGIOGRAM N/A 09/14/2014   Procedure: LEFT AND RIGHT HEART CATHETERIZATION WITH Beatrix Fetters;  Surgeon: Burnell Blanks, MD;  Location: [facility] CATH LAB;  Service: Cardiovascular;  Laterality: N/A;  . TEE WITHOUT CARDIOVERSION N/A 03/14/2013   Procedure: TRANSESOPHAGEAL ECHOCARDIOGRAM (TEE);  Surgeon: Lelon Perla, MD;  Location: [facility] ENDOSCOPY;  Service: Cardiovascular;  Laterality: N/A;  . TEE WITHOUT CARDIOVERSION N/A 11/14/2014   Procedure: TRANSESOPHAGEAL ECHOCARDIOGRAM (TEE);  Surgeon: Lelon Perla, MD;  Location: [facility]  ENDOSCOPY;  Service: Cardiovascular;  Laterality: N/A;  . TOTAL KNEE ARTHROPLASTY  09/10/2012   Procedure: TOTAL KNEE ARTHROPLASTY;  Surgeon: Mcarthur Rossetti, MD;  Location: [facility];  Service: Orthopedics;  Laterality: Left;  . TOTAL KNEE REVISION Right 07/15/2013   Procedure: REVISION ARTHROPLASTY RIGHT KNEE;  Surgeon: Mcarthur Rossetti, MD;  Location: [facility];  Service: Orthopedics;  Laterality: Right;  . TRANSCATHETER AORTIC VALVE REPLACEMENT, TRANSFEMORAL N/A 12/12/2014   Procedure: TRANSCATHETER AORTIC VALVE REPLACEMENT, TRANSFEMORAL;  Surgeon: Burnell Blanks, MD;  Location: [facility];  Service: Cardiovascular;  Laterality: N/A;  . TRIGGER FINGER RELEASE  09/10/2012   Procedure: RELEASE TRIGGER FINGER/A-1 PULLEY;  Surgeon: Mcarthur Rossetti, MD;  Location: [facility];  Service: Orthopedics;  Laterality: Right;  Right Ring  Finger  . TUBAL LIGATION      OB History    Gravida Para Term Preterm AB Living   3             SAB TAB Ectopic Multiple Live Births           2      Obstetric Comments   SVD x 2. SAB x 1.        Home Medications    Prior to Admission medications   Medication Sig Start Date End Date Taking? Authorizing Provider  ACCU-CHEK AVIVA PLUS test strip CHECK GLUCOSE TWICE DAILY Max of THREE TIMES DAILY 10/06/16   [provider]  acetaminophen (TYLENOL) 160 MG/5ML solution Take 320 mg by mouth every 6 (six) hours as needed for moderate pain.    [provider]  albuterol (PROVENTIL HFA;VENTOLIN HFA) 108 (90 BASE) MCG/ACT inhaler Inhale 2 puffs into the lungs every 4 (four) hours as needed for wheezing or shortness of breath.     [provider]  albuterol (PROVENTIL) (2.5 MG/3ML) 0.083% nebulizer solution Take 3 mLs (2.5 mg total) by nebulization every 4 (four) hours as needed for wheezing or shortness of breath. 08/08/15   Janece Canterbury, MD  aspirin EC 81 MG tablet Take 1 tablet (81 mg total) by mouth daily. 12/28/15   Burnell Blanks, MD  atorvastatin (LIPITOR) 80 MG tablet Take 80 mg by mouth daily.    [provider]  citalopram (CELEXA) 20 MG tablet Take 20 mg by mouth daily.    [provider]  clopidogrel (PLAVIX) 75 MG tablet Take 75 mg by mouth every morning.     [provider]  furosemide (LASIX) 40 MG tablet Take 40 mg by mouth 2 (two) times daily.    [provider]  gabapentin (NEURONTIN) 300 MG capsule Take 900 mg by mouth at bedtime.     [provider]  glipiZIDE (GLUCOTROL) 5 MG tablet Take 2 tablets (10 mg total) by mouth daily before supper. 08/08/15   Janece Canterbury, MD  latanoprost (XALATAN) 0.005 % ophthalmic solution Place 1 drop into both eyes at bedtime.    [provider]  levonorgestrel (MIRENA) 20 MCG/24HR IUD 1 Intra Uterine Device (1 each total) by Intrauterine route once.  11/05/16 11/05/16  Joylene John D, NP  losartan (COZAAR) 100 MG tablet Take 100 mg by mouth daily.    [provider]  megestrol (MEGACE) 40 MG tablet Take 2 tablets (80 mg total) by mouth 2 (two) times daily. 01/09/17   Everitt Amber, MD  metoprolol tartrate (LOPRESSOR) 25 MG tablet Take 1 tablet (25 mg total) by mouth 2 (two) times daily. 01/24/16   Burnell Blanks, MD  nitroGLYCERIN (NITROSTAT) 0.4 MG SL tablet Place 1 tablet (0.4 mg total) under the tongue every 5 (five) minutes as needed for chest pain. 02/23/17 05/24/17  Burnell Blanks, MD  OXYGEN-HELIUM IN Inhale 3 L into the lungs daily.     [provider]  potassium chloride SA (K-DUR,KLOR-CON) 20 MEQ tablet Take 40 mEq by mouth at bedtime.    [provider]  SYMBICORT 160-4.5 MCG/ACT inhaler inhale 2 puffs EVERY TWELVE HOURS 10/24/16   [provider]  ZETIA 10 MG tablet TAKE 1 TABLET BY MOUTH EVERY DAY 02/10/17   Burnell Blanks, MD    Family History Family History  Problem Relation Age of Onset  . Breast cancer Mother   . Emphysema Mother        smoked  . Heart disease Mother   . COPD Father        smoked  . Asthma Father   . Heart disease Father   . Cancer Brother        Sinus    Social History Social History   Tobacco Use  . Smoking status: Former Smoker    Packs/day: 1.50    Years: 30.00    Pack years: 45.00    Types: Cigarettes    Last attempt to quit: 12/29/2000    Years since quitting: 17.0  . Smokeless tobacco: Never Used  Substance Use Topics  . Alcohol use: Yes    Alcohol/week: 0.6 oz    Types: 1 Cans of beer per week    Comment: rarely  . Drug use: No     Allergies   Patient has no known allergies.   Review of Systems Review of Systems  Constitutional: Negative for chills and fever.  Respiratory: Negative for cough, chest tightness and shortness of breath.   Cardiovascular: Negative for chest pain, palpitations and leg swelling.    Gastrointestinal: Positive for abdominal pain and nausea. Negative for blood in stool, constipation, diarrhea and vomiting.  Genitourinary: Positive for flank pain. Negative for difficulty urinating, dysuria, frequency, hematuria, menstrual problem, pelvic pain, urgency, vaginal bleeding and vaginal discharge.  Musculoskeletal: Positive for back pain and myalgias.  Skin: Negative for rash.  All other systems reviewed and are negative.    Physical Exam Updated Vital Signs BP (!) 143/55   Pulse 72   Temp 97.6 F (36.4 C) (Oral)   Resp (!) 22   Ht 5\' 2"  (1.575 m)   Wt (!) 147 kg (324 lb)   LMP 02/03/2012   SpO2 99%   BMI 59.26 kg/m   Physical Exam  Constitutional: She appears well-developed and well-nourished. No distress.  HENT:  Head: Normocephalic and atraumatic.  Mouth/Throat: Oropharynx is clear and moist.  Eyes: Conjunctivae and EOM are normal. Pupils are equal, round, and reactive to light.  Neck: Normal range of motion. Neck supple.  Cardiovascular: Normal rate, regular rhythm, S1 normal and S2 normal.  No murmur heard. Pulmonary/Chest: Effort normal and breath sounds normal. She has no wheezes. She has no rales.  Nasal cannula in place at 3 L per baseline.  Abdominal: Soft. She exhibits no distension. There is tenderness. There is no guarding.  There is pain and tenderness to the abdomen particularly in the suprapubic region and the right upper quadrant.  Assessment and point tenderness of the abdomen limited by body habitus. + CVA tenderness on right.  Musculoskeletal: Normal range of motion. She exhibits no edema or deformity.  There is tenderness to palpation of the right  flank in the lower thoracic region.  No midline tenderness to palpation of cervical, thoracic, or lumbar spine.  Lymphadenopathy:    She has no cervical adenopathy.  Neurological: She is alert.  Cranial nerves grossly intact. Patient moves extremities symmetrically and with good coordination.   Skin: Skin is warm and dry. No rash noted. No erythema.  Psychiatric: She has a normal mood and affect. Her behavior is normal. Judgment and thought content normal.  Nursing note and vitals reviewed.    ED Treatments / Results  Labs (all labs ordered are listed, but only abnormal results are displayed) Labs Reviewed  URINALYSIS, ROUTINE W REFLEX MICROSCOPIC - Abnormal; Notable for the following components:      Result Value   APPearance HAZY (*)    Leukocytes, UA TRACE (*)    Bacteria, UA MANY (*)    Squamous Epithelial / LPF 0-5 (*)    All other components within normal limits  BASIC METABOLIC PANEL - Abnormal; Notable for the following components:   Chloride 97 (*)    Glucose, Bld 159 (*)    All other components within normal limits  CBC - Abnormal; Notable for the following components:   MCH 25.4 (*)    All other components within normal limits    EKG  EKG Interpretation None       Radiology No results found.  Procedures Procedures (including critical care time)  Medications Ordered in ED Medications - No data to display   Initial Impression / Assessment and Plan / ED Course  I have reviewed the triage vital signs and the nursing notes.  Pertinent labs & imaging results that were available during my care of the patient were reviewed by me and considered in my medical decision making (see chart for details).      Final Clinical Impressions(s) / ED Diagnoses   Final diagnoses:  Flank pain  Urinary tract infection without hematuria, site unspecified   Patient is nontoxic-appearing, afebrile, non-tachycardic, normotensive and in no acute distress at this time.  Patient is uncomfortable on palpation of the site of her pain in the right flank or right lateral abdomen.  Differential diagnosis includes nephrolithiasis versus pyelonephritis versus muscular strain.  Urinary analysis does demonstrate evidence of infection in the urine, so will need to rule out  infected stone at this time.  CT scan of the abdomen and pelvis without contrast ordered, which will also assess for pyelonephritis.  Doubt pulmonary embolism, as patient is Wells negative.  Patient does get points for age and previous pulmonary embolism on PERC rule, however examination and history is not consistent with pulmonary embolism.  Case discussed with Dr. Blanchie Dessert.  CT scan of the abdomen and pelvis demonstrates no evidence of acute organ pathology causing patient's flank pain.  Thoracic degenerative disc disease noted, which may be contributory to patient's symptoms.  Patient is tender to palpation in the right thoracic paraspinal musculature.  Given evidence of urinary tract infection and history of flank pain, will treat with 7 days of Keflex.  Patient disclosed financial difficulties in obtaining her medications, and she is actively trying to get a primary care provider set up with [facility].  I consulted the case management in emergency department for match program, who assisted patient.  Patient was given return precautions for any persistent fevers or chills, intractable nausea or vomiting, focal abdominal tenderness, or worsening symptoms.  Patient is in understanding and agrees with the plan of care.  ED  Discharge Orders    None       Tamala Julian 01/12/18 2314    Blanchie Dessert, MD 01/13/18 2143

## 2018-01-13 LAB — URINE CULTURE

## 2018-01-25 ENCOUNTER — Other Ambulatory Visit: Payer: Self-pay

## 2018-01-25 ENCOUNTER — Encounter (HOSPITAL_COMMUNITY): Payer: Self-pay | Admitting: Nurse Practitioner

## 2018-01-25 ENCOUNTER — Emergency Department (HOSPITAL_COMMUNITY): Payer: Self-pay

## 2018-01-25 ENCOUNTER — Emergency Department (HOSPITAL_COMMUNITY)
Admission: EM | Admit: 2018-01-25 | Discharge: 2018-01-25 | Discharge status: Against Medical Advice | Payer: Self-pay | Attending: Emergency Medicine | Admitting: Emergency Medicine

## 2018-01-25 DIAGNOSIS — R0602 Shortness of breath: Secondary | ICD-10-CM | POA: Insufficient documentation

## 2018-01-25 DIAGNOSIS — Z5321 Procedure and treatment not carried out due to patient leaving prior to being seen by health care provider: Secondary | ICD-10-CM | POA: Insufficient documentation

## 2018-01-25 LAB — BASIC METABOLIC PANEL
Anion gap: 14 (ref 5–15)
BUN: 8 mg/dL (ref 6–20)
CO2: 24 mmol/L (ref 22–32)
CREATININE: 0.72 mg/dL (ref 0.44–1.00)
Calcium: 9.1 mg/dL (ref 8.9–10.3)
Chloride: 100 mmol/L — ABNORMAL LOW (ref 101–111)
GFR calc non Af Amer: >60 mL/min (ref >60)
Glucose, Bld: 182 mg/dL — ABNORMAL HIGH (ref 65–99)
POTASSIUM: 4.5 mmol/L (ref 3.5–5.1)
Sodium: 138 mmol/L (ref 135–145)

## 2018-01-25 LAB — CBC
HEMATOCRIT: 37.4 % (ref 36.0–46.0)
Hemoglobin: 11.7 g/dL — ABNORMAL LOW (ref 12.0–15.0)
MCH: 25.7 pg — AB (ref 26.0–34.0)
MCHC: 31.3 g/dL (ref 30.0–36.0)
MCV: 82.2 fL (ref 78.0–100.0)
PLATELETS: 233 10*3/uL (ref 150–400)
RBC: 4.55 MIL/uL (ref 3.87–5.11)
RDW: 15.3 % (ref 11.5–15.5)
WBC: 7.6 10*3/uL (ref 4.0–10.5)

## 2018-01-25 LAB — I-STAT TROPONIN, ED: Troponin i, poc: 0 ng/mL (ref 0.00–0.08)

## 2018-01-25 LAB — I-STAT BETA HCG BLOOD, ED (MC, WL, AP ONLY): I-stat hCG, quantitative: <5 m[IU]/mL (ref <5)

## 2018-01-25 IMAGING — DX DG CHEST 2V
2 series · 2 of 2 positions shown · non-contrast
Comparison: Chest x-rays dated [DATE] and [DATE].

CLINICAL DATA: Cough, congestion and shortness of breath worsening
over the night. Ongoing upper respiratory infection symptoms for 3
days.

EXAM:
CHEST  2 VIEW

[w chest pa]
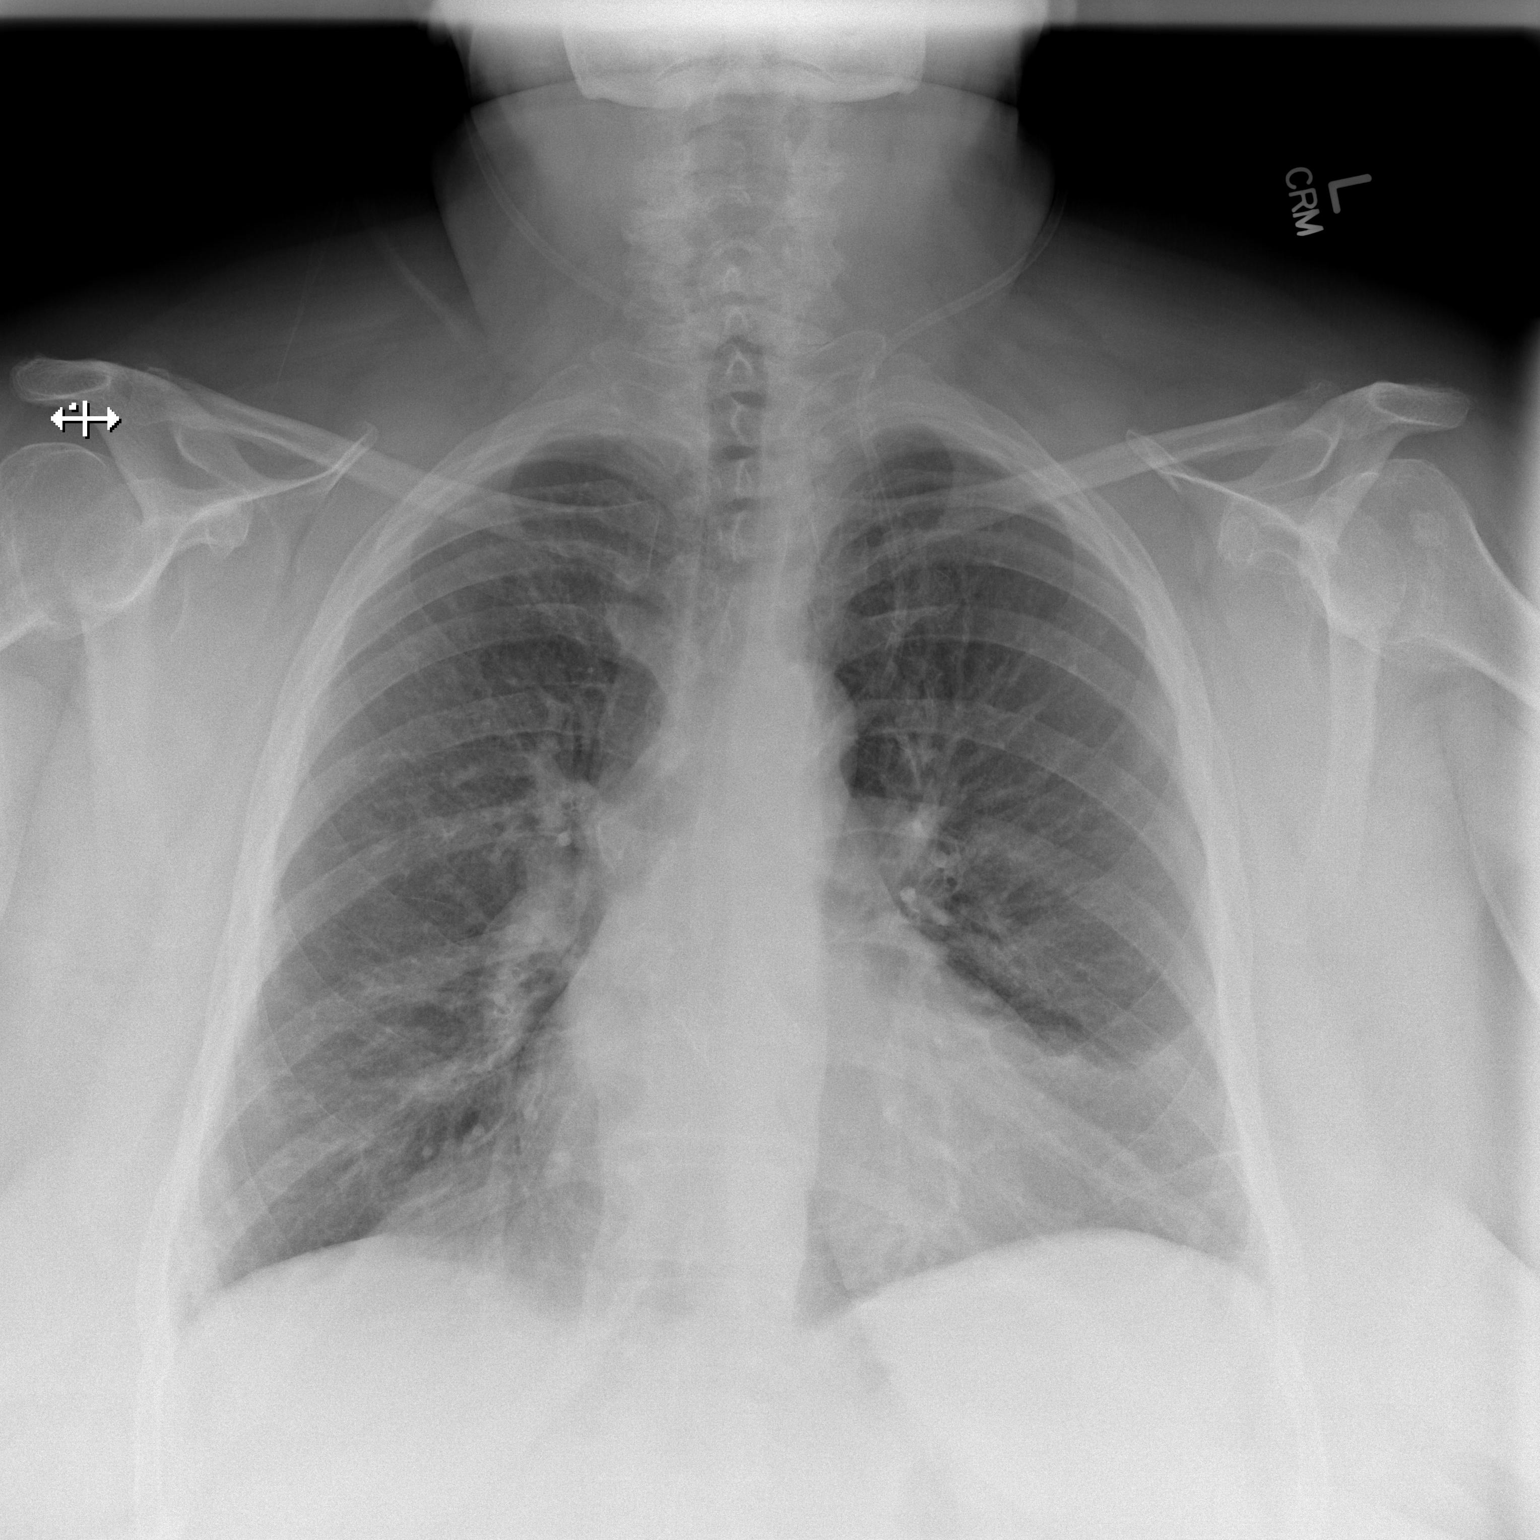

[w chest lat]
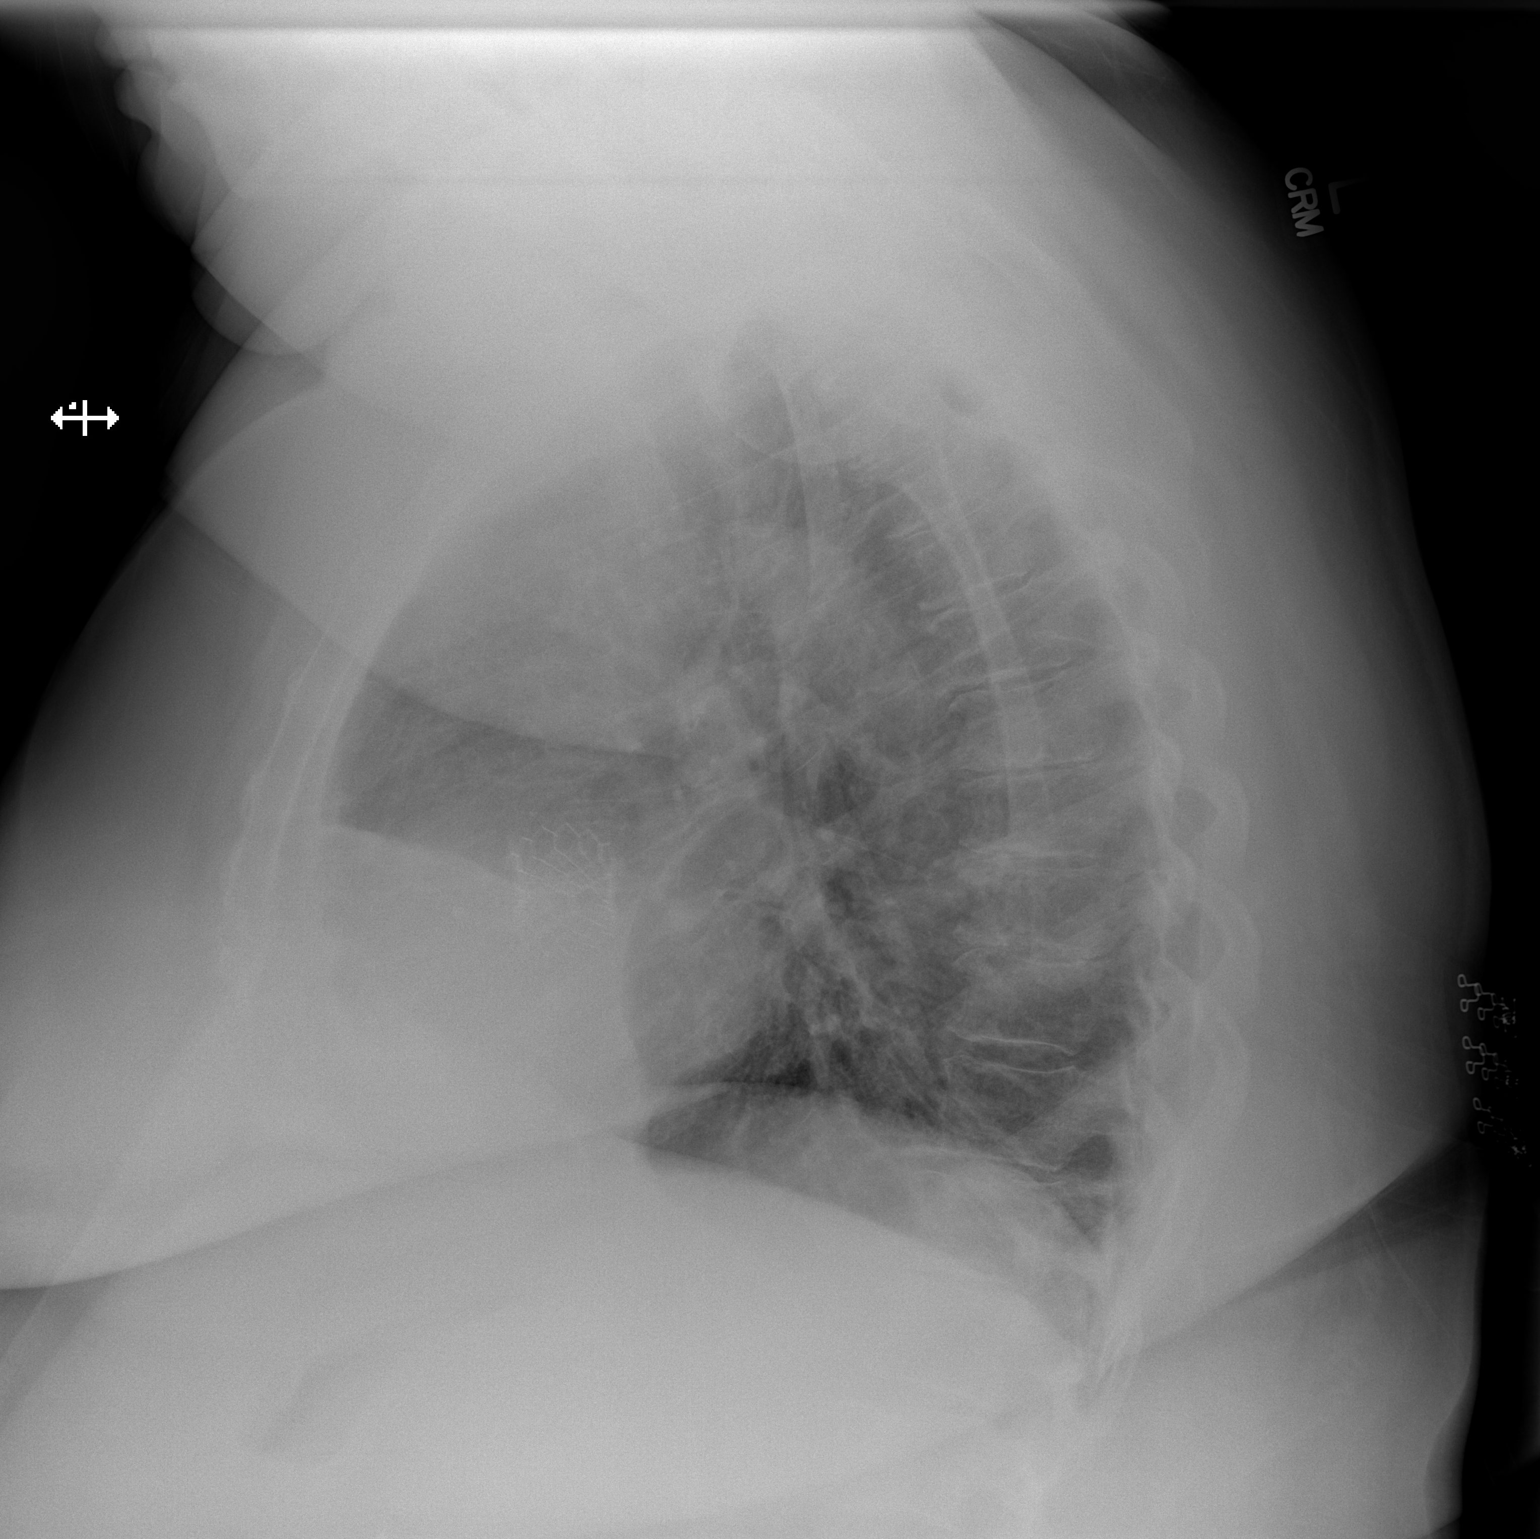

[2 of 2 positions shown; findings below may reference images not displayed]

FINDINGS: Heart size is upper normal, stable. Overall cardiomediastinal
silhouette appears stable. No new airspace opacity to suggest a
developing pneumonia. No pleural effusion or pneumothorax seen.
Osseous structures about the chest are unremarkable.
IMPRESSION: No active cardiopulmonary disease. No evidence of pneumonia or
pulmonary edema.

## 2018-01-25 NOTE — ED Notes (Signed)
Results and VS reviewed.

## 2018-01-25 NOTE — ED Triage Notes (Signed)
Pt endorses cough, congestion and shob worsening over the night. Ongoing URI symptoms x 3 days. Pt on oxygen 3L [place] constantly- pt sts increased to 4. Pt appears dyspenic presently- able to speak full sentences. Pt endorses nausea and subjective fevers/chills.

## 2018-01-25 NOTE — ED Notes (Signed)
Patient up to desk saying she cant wait any longer and she is leaving. Encouraged patient to stay but could not even guess at wait time since several patients still in front of her. Apologized for not providing adequate care, encouraged her to return if needed.

## 2018-02-03 ENCOUNTER — Other Ambulatory Visit: Payer: Self-pay

## 2018-02-03 ENCOUNTER — Encounter: Payer: Self-pay | Admitting: Internal Medicine

## 2018-02-03 ENCOUNTER — Ambulatory Visit: Payer: Self-pay | Admitting: Internal Medicine

## 2018-02-03 VITALS — BP 154/71 | HR 87 | Temp 98.3°F | Ht 62.25 in | Wt 338.2 lb

## 2018-02-03 DIAGNOSIS — D649 Anemia, unspecified: Secondary | ICD-10-CM

## 2018-02-03 DIAGNOSIS — I5032 Chronic diastolic (congestive) heart failure: Secondary | ICD-10-CM

## 2018-02-03 DIAGNOSIS — Z96653 Presence of artificial knee joint, bilateral: Secondary | ICD-10-CM

## 2018-02-03 DIAGNOSIS — I110 Hypertensive heart disease with heart failure: Secondary | ICD-10-CM

## 2018-02-03 DIAGNOSIS — E119 Type 2 diabetes mellitus without complications: Secondary | ICD-10-CM

## 2018-02-03 DIAGNOSIS — I152 Hypertension secondary to endocrine disorders: Secondary | ICD-10-CM

## 2018-02-03 DIAGNOSIS — J441 Chronic obstructive pulmonary disease with (acute) exacerbation: Secondary | ICD-10-CM

## 2018-02-03 DIAGNOSIS — G2581 Restless legs syndrome: Secondary | ICD-10-CM

## 2018-02-03 DIAGNOSIS — F329 Major depressive disorder, single episode, unspecified: Secondary | ICD-10-CM

## 2018-02-03 DIAGNOSIS — G4733 Obstructive sleep apnea (adult) (pediatric): Secondary | ICD-10-CM

## 2018-02-03 DIAGNOSIS — I2510 Atherosclerotic heart disease of native coronary artery without angina pectoris: Secondary | ICD-10-CM

## 2018-02-03 DIAGNOSIS — Z9981 Dependence on supplemental oxygen: Secondary | ICD-10-CM

## 2018-02-03 DIAGNOSIS — E785 Hyperlipidemia, unspecified: Secondary | ICD-10-CM

## 2018-02-03 DIAGNOSIS — Z86711 Personal history of pulmonary embolism: Secondary | ICD-10-CM

## 2018-02-03 DIAGNOSIS — F3342 Major depressive disorder, recurrent, in full remission: Secondary | ICD-10-CM

## 2018-02-03 DIAGNOSIS — J449 Chronic obstructive pulmonary disease, unspecified: Secondary | ICD-10-CM

## 2018-02-03 DIAGNOSIS — Z87891 Personal history of nicotine dependence: Secondary | ICD-10-CM

## 2018-02-03 DIAGNOSIS — Z954 Presence of other heart-valve replacement: Secondary | ICD-10-CM

## 2018-02-03 DIAGNOSIS — I10 Essential (primary) hypertension: Secondary | ICD-10-CM

## 2018-02-03 DIAGNOSIS — E1159 Type 2 diabetes mellitus with other circulatory complications: Secondary | ICD-10-CM

## 2018-02-03 NOTE — Patient Instructions (Addendum)
Please have the results of your colonoscopy sent to our clinic   Start taking metformin 500 mg daily   I will send in refills for all of your medication   FOLLOW-UP INSTRUCTIONS When: 1 month ( or sooner if your orange card is set up ) to meet your primary care provider  For: general check up  What to bring: all of your medication bottles, blood glucose meter

## 2018-02-03 NOTE — Progress Notes (Signed)
CC: establishing care in the internal medicine clinic   HPI:  [name] is a 57 y.o. with PMH coronary artery disease ( s/p DES to the distal RCA in 2010) , HFpEF ( Grade 1 diastolic dysfunction), mild OSA (not on CPAP), Severe Aortic stenosis (s/p TAVR 11/2014), type 2 diabetes mellitus, HTN, HLD, morbid obesity, COPD ( on 3 L of oxygen 24 hours daily), normocytic anemia, hx of provoked PE  Uterine fibroids, s/p cholecystectomy, S/p bilateral knee replacement, hx of former tobacco use with 45 pack year smoking history who presents to establish care in the internal medicine clinic. Please see the assessment and plans for the status of the patient chronic medical problems.   Past Medical History:  Diagnosis Date  . Anemia   . Aortic valve stenosis, severe   . 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 Asthma   . Blood transfusion    2013[facility]  . CAD (coronary artery disease)    Cath 2010 with DES x 1 RCA-- PT'S CARDIOLOGIST IS DR. [place]  . Chronic diastolic congestive heart failure ([place])   . COPD (chronic obstructive pulmonary disease) ([place])   . Depression   . Diabetes mellitus DIAGNOSED IN2010   ORAL MEDS  . Eczema    on back  . Hyperlipidemia   . Hypertension   . Kidney stones   . Morbid obesity with body mass index of 60.0-69.9 in adult [facility])   . Myocardial infarction ([place])    PT THINKS SHE WAS DX WITH MI AT THE TIME OF HEART STENTING  . Pulmonary embolism ([place]) 02/08/12   S/P RT TOTAL KNEE ON 02/03/12--ON 02/08/12--DEVELOPED ACUTE SOB AND CHEST PAIN--AND DIAGNOSED WITH  PULMONARY EMBOLUS AND PNEUMONIA  . Restless leg syndrome   . Sleep apnea    uses 3 liters O2 at night   . Uterine fibroid    NO PROBLEMS AT PRESENT FROM THE FIBROIDS-STATES SHE IS POST MENOPAUSAL-LAST MENSES 2010 EXCEPT FOR EPISODE THIS YR OF BLEEDING RELATED TO FIBROIDS.  Marland Kitchen Weakness    BOTH  HANDS - S/P BILATERAL CARPAL TUNNEL RELEASE--BUT STILL HAS WEAKNESS--OFTEN DROPS THINGS   Past Surgical History:  Procedure Laterality Date  . CARDIAC CATHETERIZATION    . CARPAL TUNNEL RELEASE     Bilateral  . CHOLECYSTECTOMY    . CORONARY ANGIOPLASTY     2010 has stent in place  . CYSTOSCOPY W/ RETROGRADES Right 09/21/2013   Procedure: CYSTOSCOPY WITH RIGHT RETROGRADE PYELOGRAM RIGHT DOUBLE J STENT ;  Surgeon: Fredricka Bonine, MD;  Location: [facility];  Service: Urology;  Laterality: Right;  . CYSTOSCOPY WITH URETEROSCOPY AND STENT PLACEMENT Right 10/25/2013   Procedure: CYSTOSCOPY RIGHT URETEROSCOPY HOLMIUM LASER LITHO AND STENT PLACEMENT;  Surgeon: Fredricka Bonine, MD;  Location: [facility];  Service: Urology;  Laterality: Right;  . HERNIA REPAIR    . INTRAOPERATIVE TRANSESOPHAGEAL ECHOCARDIOGRAM N/A 12/12/2014   Procedure: INTRAOPERATIVE TRANSESOPHAGEAL ECHOCARDIOGRAM;  Surgeon: Burnell Blanks, MD;  Location: [facility] OR;  Service: Cardiovascular;  Laterality: N/A;  . JOINT REPLACEMENT     bil total knees  . KNEE ARTHROPLASTY  02/03/2012   Procedure: COMPUTER ASSISTED TOTAL KNEE ARTHROPLASTY;  Surgeon: Mcarthur Rossetti, MD;  Location: [place];  Service: Orthopedics;  Laterality: Right;  Right total knee arthroplasty  . LEFT AND RIGHT HEART CATHETERIZATION WITH CORONARY ANGIOGRAM N/A 03/17/2013  Procedure: LEFT AND RIGHT HEART CATHETERIZATION WITH CORONARY ANGIOGRAM;  Surgeon: Burnell Blanks, MD;  Location: [facility] CATH LAB;  Service: Cardiovascular;  Laterality: N/A;  . LEFT AND RIGHT HEART CATHETERIZATION WITH CORONARY/GRAFT ANGIOGRAM N/A 09/14/2014   Procedure: LEFT AND RIGHT HEART CATHETERIZATION WITH Beatrix Fetters;  Surgeon: Burnell Blanks, MD;  Location: [facility] CATH LAB;  Service: Cardiovascular;  Laterality: N/A;  . TEE WITHOUT CARDIOVERSION N/A 03/14/2013   Procedure: TRANSESOPHAGEAL ECHOCARDIOGRAM (TEE);  Surgeon: Lelon Perla, MD;  Location: [facility]  ENDOSCOPY;  Service: Cardiovascular;  Laterality: N/A;  . TEE WITHOUT CARDIOVERSION N/A 11/14/2014   Procedure: TRANSESOPHAGEAL ECHOCARDIOGRAM (TEE);  Surgeon: Lelon Perla, MD;  Location: [facility] ENDOSCOPY;  Service: Cardiovascular;  Laterality: N/A;  . TOTAL KNEE ARTHROPLASTY  09/10/2012   Procedure: TOTAL KNEE ARTHROPLASTY;  Surgeon: Mcarthur Rossetti, MD;  Location: [facility];  Service: Orthopedics;  Laterality: Left;  . TOTAL KNEE REVISION Right 07/15/2013   Procedure: REVISION ARTHROPLASTY RIGHT KNEE;  Surgeon: Mcarthur Rossetti, MD;  Location: [facility];  Service: Orthopedics;  Laterality: Right;  . TRANSCATHETER AORTIC VALVE REPLACEMENT, TRANSFEMORAL N/A 12/12/2014   Procedure: TRANSCATHETER AORTIC VALVE REPLACEMENT, TRANSFEMORAL;  Surgeon: Burnell Blanks, MD;  Location: [place];  Service: Cardiovascular;  Laterality: N/A;  . TRIGGER FINGER RELEASE  09/10/2012   Procedure: RELEASE TRIGGER FINGER/A-1 PULLEY;  Surgeon: Mcarthur Rossetti, MD;  Location[facility];  Service: Orthopedics;  Laterality: Right;  Right Ring Finger  . TUBAL LIGATION     Social History   Socioeconomic History  . Marital status: Married    Spouse name: Not on file  . Number of children: 2  . Years of education: Not on file  . Highest education level: Not on file  Social Needs  . Financial resource strain: Not on file  . Food insecurity - worry: Not on file  . Food insecurity - inability: Not on file  . Transportation needs - medical: Not on file  . Transportation needs - non-medical: Not on file  Occupational History  . Occupation: Disabled  Tobacco Use  . Smoking status: Former Smoker    Packs/day: 1.50    Years: 30.00    Pack years: 45.00    Types: Cigarettes    Last attempt to quit: 12/29/2000    Years since quitting: 17.1  . Smokeless tobacco: Never Used  Substance and Sexual Activity  . Alcohol use: Yes    Alcohol/week: 0.6 oz    Types: 1 Cans of beer per week    Comment: rarely  .  Drug use: No  . Sexual activity: No    Birth control/protection: Surgical  Other Topics Concern  . Not on file  Social History Narrative  . Not on file   Review of Systems:  Refer to history of present illness and assessment and plans for pertinent review of systems, all others reviewed and negative  Physical Exam:  Vitals:   02/03/18 1050  BP: (!) 154/71  Pulse: 87  Temp: 98.3 F (36.8 C)  TempSrc: Oral  SpO2: 94%  Weight: (!) 338 lb 3.2 oz (153.4 kg)  Height: 5' 2.25" (1.581 m)  Physical Exam General: Well-appearing, no acute distress HEENT: no scleral icterus, no scleral erythema  Neck: normal ROM, neck supple  Cardiac: regular rate an rhythm, no murmur appreciable, no peripheral edema  Pulm; Lungs clear to ausculation, no rhonchi, no respiratory distress, no wheezing, good air movement, wearing 2 liters of nasal canula Abdomen: Soft, nondistended, nontender Skin:  No obvious rashes over the exposed face, neck, arms Psych: pleasant, conversational   Assessment & Plan:   HFrEF Takes lasix one time daily "sometimes". Not taking weights at home, doesn't have a scale to do so. Denies orthopnea, PND, peripheral edema at this time, she does experience them from time to time. Has shortness of breath with walking at times, walking is limited by arthritis so she barely gets to the point of developing symptoms. She is euvolemic on exam today  - following with cardiology, apt in march    - refill zetia 10 mg qd and atorvastatin 80 mg qd  - HR 87 today, didn't take metoprolol yet, refilled metoprolol 25 units BID - refilled clopidogrel and aspirin 81 mg qd, counseled on the danger of taking goody powder with this medication combination  - continue lasix 40 mg as needed, also on potassium but advised not to take the potassium if she is not going to take lasix   T2DM  Currently on diet control, not taking prescribed glipizide. History of hypoglycemia in the past.  Meter broken for the  past 3 year. She was on glipizide but ran out of this medication.  - start metformin 500 mg qd, may increase this at follow up   - planned for A1c today, she asked that we not do this today due to the cost, will return after orange card has been established   - foot exam today-feet are well groomed, no concern for diabetic ulcers or neuropathy at this time - will need eye exam arranged at follow up, none prior in our system  - will need a new meter when she has some type of medical coverage established, we may be able to provide a sample meter from the supply in our office when she returns  Normocytic anemia  Hgb 11.7 on last check in january, no iron studies in our system since 2013. History of uterine fibroids, last colonoscopy was apparently at age 20 - had polyps but "nothing to be worried about" repeat recommended in 10 years , no records in our epic system. Denies BRBPR, hematochezia, hematemesis or hemoptysis.  - will need follow up iron studies when she has her orange card  -We will need to discuss hysterectomy and colonoscopy when she has insurance coverage  COPD  PFTs in 2015 consistent with severe obstruction that did not respond to bronchodilators.  He is currently on Symbicort high-dose twice daily and albuterol as needed.  She says that she requires albuterol for wheezing and shortness of breath about 4 times daily. She has the symptoms at night about once per month.  I am concerned that Symbicort is not working to completely control her symptoms of COPD she may benefit from transitioning to an inhaled LAMA, LABA, glucocorticoid therapy. - Refill albuterol, continue to use PRN  - Reaching out to clinic pharmacist Dr. Maudie Mercury, she will sign her up for a trelegy inhaler assistance program and we will be able to provide a sample of this med at follow up visit   Restless leg  Gabapentin working to control her symptoms.   - Refill gabapentin 900 mg qHS   Depression  Reports mood is  controlled, PHQ 2 today is 0.  - Refill celexa 20 mg qd   Hypertension  Completely out of blood pressure medications, did take losartan today.  - Refill losartan 100 mg daily   Hx of provoked pulmonary embolism  Hx of small subsegmental PE in 2013 in the setting  of recent knee replacement (seen on CT angio chest 11/2012).  Completed 6 months of treatment with Coumadin. Signs and symptoms at this time do not seem consistent with PE. She was on megace for uterine bleeding from fibroids but has not had bleeding in a while so she self discontinued this.  - monitor closely   Mild OSA  AHI 5 on sleep study in 2014.  No reports of changes in the symptoms.  He does not use CPAP. -Continue to monitor  See Encounters Tab for problem based charting.  Patient discussed with Dr. Lynnae January

## 2018-02-04 DIAGNOSIS — G2581 Restless legs syndrome: Secondary | ICD-10-CM | POA: Insufficient documentation

## 2018-02-04 MED ORDER — METFORMIN HCL 500 MG PO TABS: 500.0000 mg | ORAL_TABLET | Freq: Every day | ORAL | 11 refills | Status: DC

## 2018-02-04 NOTE — Assessment & Plan Note (Signed)
Currently on diet control, not taking prescribed glipizide. History of hypoglycemia in the past.  Meter broken for the past 3 year. She was on glipizide but ran out of this medication.  - start metformin 500 mg qd, may increase this at follow up   - planned for A1c today, she asked that we not do this today due to the cost, will return after orange card has been established   - foot exam today-feet are well groomed, no concern for diabetic ulcers or neuropathy at this time - will need eye exam arranged at follow up, none prior in our system  - will need a new meter when she has some type of medical coverage established, we may be able to provide a sample meter from the supply in our office when she returns

## 2018-02-04 NOTE — Assessment & Plan Note (Signed)
Takes lasix one time daily "sometimes". Not taking weights at home, doesn't have a scale to do so. Denies orthopnea, PND, peripheral edema at this time, she does experience them from time to time. Has shortness of breath with walking at times, walking is limited by arthritis so she barely gets to the point of developing symptoms. She is euvolemic on exam today  - following with cardiology, apt in march    - refill zetia 10 mg qd and atorvastatin 80 mg qd  - HR 87 today, didn't take metoprolol yet, refilled metoprolol 25 units BID - refilled clopidogrel and aspirin 81 mg qd, counseled on the danger of taking goody powder with this medication combination  - continue lasix 40 mg as needed, also on potassium but advised not to take the potassium if she is not going to take lasix

## 2018-02-04 NOTE — Assessment & Plan Note (Signed)
Hgb 11.7 on last check in january, no iron studies in our system since 2013. History of uterine fibroids, last colonoscopy was apparently at age 57 - had polyps but "nothing to be worried about" repeat recommended in 10 years , no records in our epic system. Denies BRBPR, hematochezia, hematemesis or hemoptysis.  - will need follow up iron studies when she has her orange card  -We will need to discuss hysterectomy and colonoscopy when she has insurance coverage

## 2018-02-04 NOTE — Assessment & Plan Note (Signed)
Completely out of blood pressure medications, did take losartan today.  - Refill losartan 100 mg daily

## 2018-02-04 NOTE — Assessment & Plan Note (Signed)
Hx of small subsegmental PE in 2013 in the setting of recent knee replacement (seen on CT angio chest 11/2012).  Completed 6 months of treatment with Coumadin. Signs and symptoms at this time do not seem consistent with PE. She was on megace for uterine bleeding from fibroids but has not had bleeding in a while so she self discontinued this.  - monitor closely

## 2018-02-04 NOTE — Assessment & Plan Note (Signed)
AHI 5 on sleep study in 2014.  No reports of changes in the symptoms.  He does not use CPAP. -Continue to monitor

## 2018-02-04 NOTE — Assessment & Plan Note (Signed)
PFTs in 2015 consistent with severe obstruction that did not respond to bronchodilators.  He is currently on Symbicort high-dose twice daily and albuterol as needed.  She says that she requires albuterol for wheezing and shortness of breath about 4 times daily. She has the symptoms at night about once per month.  I am concerned that Symbicort is not working to completely control her symptoms of COPD she may benefit from transitioning to an inhaled LAMA, LABA, glucocorticoid therapy. - Refill albuterol, continue to use PRN  - Reaching out to clinic pharmacist Dr. Maudie Mercury, will have an affordable plan ( samples vs [employer] home fund ) for triple inhaler at follow up visit in 1 week

## 2018-02-04 NOTE — Assessment & Plan Note (Signed)
Gabapentin working to control her symptoms.   - Refill gabapntin 900 mg qHS

## 2018-02-04 NOTE — Assessment & Plan Note (Signed)
Reports mood is controlled, PHQ 2 today is 0.  - Refill celexa 20 mg qd

## 2018-02-08 MED ORDER — ATORVASTATIN CALCIUM 80 MG PO TABS: 80.0000 mg | ORAL_TABLET | Freq: Every day | ORAL | 1 refills | Status: DC

## 2018-02-08 MED ORDER — CLOPIDOGREL BISULFATE 75 MG PO TABS: 75.0000 mg | ORAL_TABLET | Freq: Every morning | ORAL | 1 refills | Status: DC

## 2018-02-08 MED ORDER — LOSARTAN POTASSIUM 100 MG PO TABS: 100.0000 mg | ORAL_TABLET | Freq: Every day | ORAL | 1 refills | Status: DC

## 2018-02-08 MED ORDER — GABAPENTIN 300 MG PO CAPS: 900.0000 mg | ORAL_CAPSULE | Freq: Every day | ORAL | 1 refills | Status: DC

## 2018-02-08 MED ORDER — ALBUTEROL SULFATE HFA 108 (90 BASE) MCG/ACT IN AERS: 2.0000 | INHALATION_SPRAY | RESPIRATORY_TRACT | 3 refills | Status: DC | PRN

## 2018-02-08 MED ORDER — METOPROLOL TARTRATE 25 MG PO TABS: 25.0000 mg | ORAL_TABLET | Freq: Two times a day (BID) | ORAL | 1 refills | Status: DC

## 2018-02-08 MED ORDER — EZETIMIBE 10 MG PO TABS: 10.0000 mg | ORAL_TABLET | Freq: Every day | ORAL | 1 refills | Status: DC

## 2018-02-08 MED ORDER — CITALOPRAM HYDROBROMIDE 20 MG PO TABS: 20.0000 mg | ORAL_TABLET | Freq: Every day | ORAL | 1 refills | Status: DC

## 2018-02-08 NOTE — Progress Notes (Signed)
Internal Medicine Clinic Attending  Case discussed with Dr. Blum at the time of the visit.  We reviewed the resident's history and exam and pertinent patient test results.  I agree with the assessment, diagnosis, and plan of care documented in the resident's note. 

## 2018-02-09 ENCOUNTER — Encounter: Payer: Self-pay | Admitting: Internal Medicine

## 2018-02-09 ENCOUNTER — Ambulatory Visit (INDEPENDENT_AMBULATORY_CARE_PROVIDER_SITE_OTHER): Payer: Self-pay | Admitting: Internal Medicine

## 2018-02-09 ENCOUNTER — Ambulatory Visit: Payer: Self-pay | Admitting: Pharmacist

## 2018-02-09 ENCOUNTER — Other Ambulatory Visit: Payer: Self-pay

## 2018-02-09 ENCOUNTER — Ambulatory Visit: Payer: Self-pay

## 2018-02-09 VITALS — BP 144/81 | HR 102 | Wt 337.2 lb

## 2018-02-09 DIAGNOSIS — Z87891 Personal history of nicotine dependence: Secondary | ICD-10-CM

## 2018-02-09 DIAGNOSIS — J449 Chronic obstructive pulmonary disease, unspecified: Secondary | ICD-10-CM

## 2018-02-09 DIAGNOSIS — Z7951 Long term (current) use of inhaled steroids: Secondary | ICD-10-CM

## 2018-02-09 DIAGNOSIS — J441 Chronic obstructive pulmonary disease with (acute) exacerbation: Secondary | ICD-10-CM

## 2018-02-09 DIAGNOSIS — D649 Anemia, unspecified: Secondary | ICD-10-CM

## 2018-02-09 DIAGNOSIS — N8502 Endometrial intraepithelial neoplasia [EIN]: Secondary | ICD-10-CM

## 2018-02-09 DIAGNOSIS — Z23 Encounter for immunization: Secondary | ICD-10-CM

## 2018-02-09 DIAGNOSIS — E119 Type 2 diabetes mellitus without complications: Secondary | ICD-10-CM

## 2018-02-09 DIAGNOSIS — Z1159 Encounter for screening for other viral diseases: Secondary | ICD-10-CM

## 2018-02-09 DIAGNOSIS — M25532 Pain in left wrist: Secondary | ICD-10-CM

## 2018-02-09 LAB — POCT GLYCOSYLATED HEMOGLOBIN (HGB A1C): Hemoglobin A1C: 6.8

## 2018-02-09 LAB — GLUCOSE, CAPILLARY: Glucose-Capillary: 171 mg/dL — ABNORMAL HIGH (ref 65–99)

## 2018-02-09 MED ORDER — DICLOFENAC SODIUM 3 % EX CREA: 1.0000 | TOPICAL_CREAM | Freq: Two times a day (BID) | CUTANEOUS | 1 refills | Status: DC

## 2018-02-09 MED ORDER — FLUTICASONE-UMECLIDIN-VILANT 100-62.5-25 MCG/INH IN AEPB: 1.0000 | INHALATION_SPRAY | Freq: Every day | RESPIRATORY_TRACT | 11 refills | Status: DC

## 2018-02-09 NOTE — Patient Instructions (Addendum)
It was a pleasure to see you today [name],   For the pain in your wrist try using this cream   I am referring you to an ophthalmologist for an eye exam, if you dont hear back about scheduling this within a week please call us for an update    I am referring you to an ophthalmologist for an eye exam,  if you dont hear back about scheduling this within a week please call us for an update    Please have the results of your colonoscopy sent to our office   FOLLOW-UP INSTRUCTIONS When:  March 8,15,22 with Dr. Reesa Chew ( schedule this visit at the front desk)  For: general check up  What to bring: all of your medication bottles

## 2018-02-09 NOTE — Progress Notes (Signed)
CC: follow up for HTN, COPD, complex atypical endometrial hyperplasia   HPI:  [name] is a 57 y.o. with PMH as listed below who presents for follow up HTN, COPD, complex atypical endometrial hyperplasia, acute concern of left hand pain . Please see the assessment and plans for the status of the patient chronic medical problems.    Past Medical History:  Diagnosis Date  . Anemia   . Aortic valve stenosis, severe   . 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 Asthma   . Blood transfusion    2013[facility]  . CAD (coronary artery disease)    Cath 2010 with DES x 1 RCA-- PT'S CARDIOLOGIST IS DR. [place]  . Chronic diastolic congestive heart failure ([place])   . COPD (chronic obstructive pulmonary disease) ([place])   . Depression   . Diabetes mellitus DIAGNOSED IN2010   ORAL MEDS  . Eczema    on back  . Hyperlipidemia   . Hypertension   . Kidney stones   . Morbid obesity with body mass index of 60.0-69.9 in adult [facility])   . Myocardial infarction ([place])    PT THINKS SHE WAS DX WITH MI AT THE TIME OF HEART STENTING  . Pulmonary embolism ([place]) 02/08/12   S/P RT TOTAL KNEE ON 02/03/12--ON 02/08/12--DEVELOPED ACUTE SOB AND CHEST PAIN--AND DIAGNOSED WITH  PULMONARY EMBOLUS AND PNEUMONIA  . Restless leg syndrome   . Sleep apnea    uses 3 liters O2 at night   . Uterine fibroid    NO PROBLEMS AT PRESENT FROM THE FIBROIDS-STATES SHE IS POST MENOPAUSAL-LAST MENSES 2010 EXCEPT FOR EPISODE THIS YR OF BLEEDING RELATED TO FIBROIDS.  Marland Kitchen Weakness    BOTH HANDS - S/P BILATERAL CARPAL TUNNEL RELEASE--BUT STILL HAS WEAKNESS--OFTEN DROPS THINGS   Review of Systems: Refer to history of present illness and assessment and plans for pertinent review of systems, all others reviewed and negative  Physical Exam:  Vitals:   02/09/18 1406  BP: (!) 144/81  Pulse: (!) 102  SpO2: 97%  Weight: (!) 337  lb 3.2 oz (153 kg)   General: well appearing, no acute distress  HEENT: conjunctiva appear well perfused  Pulm: no respiratory distress, wearing nasal canula  MSK: left thumb squaring of the carpometacarpal joint, bony enlargement of the MCP and CMC, no inflammation, erythema   Assessment & Plan:   Left wrist pain  Pain over the left wrist at the base of the thumb. Denies trauma or injury. She uses this hand to play computer games during the day. Left thumb with bony enlargement and squaring but no inflammation on exam. Clinical presentation consistent with osteoarthritis.  - trial of voltaren gel   Type 2 DM  - A1c 6.8 today, shes on no glucose lowering mediations at this time, encouraged low carb diet, no need to start a glucose lowering medication at this time  - continue diet management  - referral to ophthalmology for eye exam  - prescription for new meter today   Complex atypical endometrial hyperplasia  Discovered after presentation for postmenopausal bleeding, endometrial biopsy revealed complex atypical endometrial hyperplasia. Previously managed through OBGYN, she has not seen them in the past year. She was on megace and had an IUD. IUD fell out and she self discontinued the megace, has not had vaginal bleeding in the past year. She is  due for follow up with gynecology.  - Referral to OBGYN, Hopeful that she can see Dr. Denman George who knows her well   Normocytic anemia  History of posmenopausal bleeding related to atypical endometrial hyperplasia but this has resolved. She denies any other obvious sources of bleeding such as hemoptysis, hematemesis, melena, BRBPR. NO colonoscopy in our system but she says she has had one within the last 10 years.  - encouraged to have results of last colonoscopy shared with our office   COPD  At last office visit she was describing breakthrough symptoms of COPD despite symbicort therapy. She has increased need for her albuterol inhaler - she used it  three times yesterday evening and just as frequently throughout the day.  - Dr. Maudie Mercury provided trelegy sample today  - continue albuterol PRN   Preventative health  - influenza vaccine today  - Hep C virus antibody undetectable   See Encounters Tab for problem based charting.  Patient discussed with Dr. Lynnae January

## 2018-02-09 NOTE — Progress Notes (Signed)
Co-visit 

## 2018-02-10 LAB — HEPATITIS C ANTIBODY: Hep C Virus Ab: <0.1 s/co ratio (ref 0.0–0.9)

## 2018-02-10 LAB — FERRITIN: FERRITIN: 27 ng/mL (ref 15–150)

## 2018-02-11 ENCOUNTER — Encounter: Payer: Self-pay | Admitting: Internal Medicine

## 2018-02-11 ENCOUNTER — Telehealth: Payer: Self-pay | Admitting: Dietician

## 2018-02-11 DIAGNOSIS — M25532 Pain in left wrist: Secondary | ICD-10-CM | POA: Insufficient documentation

## 2018-02-11 NOTE — Assessment & Plan Note (Signed)
Pain over the left wrist at the base of the thumb. Denies trauma or injury. She uses this hand to play computer games during the day. Left thumb with bony enlargement and squaring but no inflammation on exam. Clinical presentation consistent with osteoarthritis.  - trial of voltaren gel

## 2018-02-11 NOTE — Telephone Encounter (Signed)
Spoke with [name] about her options for obtaining a glucometer. She prefers to go to [employer]. I encouraged her to eat three times a day and not go any longer than 4-6 hours without eating. Also to check blood sugar after meals and if >140 then she knows to adjust her diet/food.

## 2018-02-11 NOTE — Assessment & Plan Note (Signed)
At last office visit she was describing breakthrough symptoms of COPD despite symbicort therapy. She has increased need for her albuterol inhaler - she used it three times yesterday evening and just as frequently throughout the day.  - Dr. Maudie Mercury provided trelegy sample today  - continue albuterol PRN

## 2018-02-11 NOTE — Assessment & Plan Note (Signed)
Discovered after presentation for postmenopausal bleeding, endometrial biopsy revealed complex atypical endometrial hyperplasia. Previously managed through OBGYN, she has not seen them in the past year. She was on megace and had an IUD. IUD fell out and she self discontinued the megace, has not had vaginal bleeding in the past year. She is due for follow up with gynecology.  - Referral to OBGYN, Hopeful that she can see Dr. Denman George who knows her well

## 2018-02-12 ENCOUNTER — Encounter: Payer: Self-pay | Admitting: Family Medicine

## 2018-02-14 MED ORDER — GLUCOSE BLOOD VI STRP: ORAL_STRIP | 12 refills | Status: DC

## 2018-02-14 MED ORDER — WAVESENSE AMP W/DEVICE KIT: 1.0000 | PACK | Freq: Once | 0 refills | Status: AC

## 2018-02-14 NOTE — Addendum Note (Signed)
Addended by: Meryl Dare on: 02/14/2018 02:58 AM   Modules accepted: Orders

## 2018-02-16 NOTE — Progress Notes (Signed)
Internal Medicine Clinic Attending  Case discussed with Dr. Blum at the time of the visit.  We reviewed the resident's history and exam and pertinent patient test results.  I agree with the assessment, diagnosis, and plan of care documented in the resident's note. 

## 2018-02-18 ENCOUNTER — Telehealth: Payer: Self-pay | Admitting: *Deleted

## 2018-02-18 NOTE — Telephone Encounter (Signed)
Pt calls and states a few days ago she got bit by a deer tick and "they got it out" she has a red spot around the bite, states she was nauseous last night, no fevers that she knows of, she wants to know if she needs to be seen?  She is ask to go to urg care or ED for fever, N&V, h/a, rash, swelling of area, hot to touch of area, draining of site, pain and redness of area. She is agreeable

## 2018-02-24 ENCOUNTER — Other Ambulatory Visit: Payer: Self-pay

## 2018-02-24 DIAGNOSIS — E785 Hyperlipidemia, unspecified: Secondary | ICD-10-CM

## 2018-02-24 DIAGNOSIS — J441 Chronic obstructive pulmonary disease with (acute) exacerbation: Secondary | ICD-10-CM

## 2018-02-24 NOTE — Telephone Encounter (Signed)
Requesting all meds to be sent to [place].

## 2018-03-01 ENCOUNTER — Encounter: Payer: Self-pay | Admitting: Family Medicine

## 2018-03-03 NOTE — Telephone Encounter (Signed)
These have been completed.

## 2018-03-08 ENCOUNTER — Other Ambulatory Visit: Payer: Self-pay | Admitting: Pharmacist

## 2018-03-08 DIAGNOSIS — I350 Nonrheumatic aortic (valve) stenosis: Secondary | ICD-10-CM

## 2018-03-08 DIAGNOSIS — I2510 Atherosclerotic heart disease of native coronary artery without angina pectoris: Secondary | ICD-10-CM

## 2018-03-08 DIAGNOSIS — E119 Type 2 diabetes mellitus without complications: Secondary | ICD-10-CM

## 2018-03-08 DIAGNOSIS — J441 Chronic obstructive pulmonary disease with (acute) exacerbation: Secondary | ICD-10-CM

## 2018-03-08 DIAGNOSIS — I5032 Chronic diastolic (congestive) heart failure: Secondary | ICD-10-CM

## 2018-03-08 DIAGNOSIS — E785 Hyperlipidemia, unspecified: Secondary | ICD-10-CM

## 2018-03-08 MED ORDER — METOPROLOL TARTRATE 25 MG PO TABS: 25.0000 mg | ORAL_TABLET | Freq: Two times a day (BID) | ORAL | 1 refills | Status: DC

## 2018-03-08 MED ORDER — ALBUTEROL SULFATE (2.5 MG/3ML) 0.083% IN NEBU: 2.5000 mg | INHALATION_SOLUTION | RESPIRATORY_TRACT | 0 refills | Status: DC | PRN

## 2018-03-08 MED ORDER — GABAPENTIN 300 MG PO CAPS: 900.0000 mg | ORAL_CAPSULE | Freq: Every day | ORAL | 1 refills | Status: DC

## 2018-03-08 MED ORDER — FUROSEMIDE 40 MG PO TABS: 40.0000 mg | ORAL_TABLET | Freq: Two times a day (BID) | ORAL | 0 refills | Status: DC

## 2018-03-08 MED ORDER — EZETIMIBE 10 MG PO TABS: 10.0000 mg | ORAL_TABLET | Freq: Every day | ORAL | 3 refills | Status: DC

## 2018-03-08 MED ORDER — ASPIRIN EC 81 MG PO TBEC: 81.0000 mg | DELAYED_RELEASE_TABLET | Freq: Every day | ORAL | 3 refills | Status: DC

## 2018-03-08 MED ORDER — NITROGLYCERIN 0.4 MG SL SUBL: 0.4000 mg | SUBLINGUAL_TABLET | SUBLINGUAL | 6 refills | Status: DC | PRN

## 2018-03-08 MED ORDER — LOSARTAN POTASSIUM 100 MG PO TABS: 100.0000 mg | ORAL_TABLET | Freq: Every day | ORAL | 1 refills | Status: DC

## 2018-03-08 MED ORDER — ATORVASTATIN CALCIUM 80 MG PO TABS: 80.0000 mg | ORAL_TABLET | Freq: Every day | ORAL | 3 refills | Status: DC

## 2018-03-08 MED ORDER — CLOPIDOGREL BISULFATE 75 MG PO TABS: 75.0000 mg | ORAL_TABLET | Freq: Every morning | ORAL | 1 refills | Status: DC

## 2018-03-08 MED ORDER — POTASSIUM CHLORIDE CRYS ER 20 MEQ PO TBCR: 40.0000 meq | EXTENDED_RELEASE_TABLET | Freq: Every day | ORAL | 0 refills | Status: DC

## 2018-03-08 MED ORDER — ALBUTEROL SULFATE HFA 108 (90 BASE) MCG/ACT IN AERS: 2.0000 | INHALATION_SPRAY | RESPIRATORY_TRACT | 3 refills | Status: DC | PRN

## 2018-03-08 NOTE — Progress Notes (Signed)
Patient enrolled in [facility] medassist program. All prescriptions were transferred with exception of citalopram, diclofenac, and methocarbamol due to not being on formulary. PCP notified. Called patient x 2 but unable to reach so left a message. Note sent to front desk to schedule appointment with PCP.

## 2018-03-12 ENCOUNTER — Other Ambulatory Visit: Payer: Self-pay

## 2018-03-12 ENCOUNTER — Ambulatory Visit (INDEPENDENT_AMBULATORY_CARE_PROVIDER_SITE_OTHER): Payer: Self-pay | Admitting: Internal Medicine

## 2018-03-12 ENCOUNTER — Encounter: Payer: Self-pay | Admitting: Cardiovascular Disease

## 2018-03-12 VITALS — BP 129/54 | HR 96 | Temp 98.1°F | Ht 62.25 in | Wt 337.4 lb

## 2018-03-12 DIAGNOSIS — M79646 Pain in unspecified finger(s): Secondary | ICD-10-CM | POA: Insufficient documentation

## 2018-03-12 DIAGNOSIS — Z1211 Encounter for screening for malignant neoplasm of colon: Secondary | ICD-10-CM

## 2018-03-12 DIAGNOSIS — Z0000 Encounter for general adult medical examination without abnormal findings: Secondary | ICD-10-CM

## 2018-03-12 DIAGNOSIS — I5032 Chronic diastolic (congestive) heart failure: Secondary | ICD-10-CM

## 2018-03-12 DIAGNOSIS — M79645 Pain in left finger(s): Secondary | ICD-10-CM

## 2018-03-12 DIAGNOSIS — Z79899 Other long term (current) drug therapy: Secondary | ICD-10-CM

## 2018-03-12 DIAGNOSIS — N8502 Endometrial intraepithelial neoplasia [EIN]: Secondary | ICD-10-CM

## 2018-03-12 DIAGNOSIS — Z7902 Long term (current) use of antithrombotics/antiplatelets: Secondary | ICD-10-CM

## 2018-03-12 DIAGNOSIS — E119 Type 2 diabetes mellitus without complications: Secondary | ICD-10-CM

## 2018-03-12 DIAGNOSIS — Z7982 Long term (current) use of aspirin: Secondary | ICD-10-CM

## 2018-03-12 DIAGNOSIS — Z23 Encounter for immunization: Secondary | ICD-10-CM

## 2018-03-12 DIAGNOSIS — M170 Bilateral primary osteoarthritis of knee: Secondary | ICD-10-CM

## 2018-03-12 DIAGNOSIS — I152 Hypertension secondary to endocrine disorders: Secondary | ICD-10-CM

## 2018-03-12 DIAGNOSIS — E1159 Type 2 diabetes mellitus with other circulatory complications: Secondary | ICD-10-CM

## 2018-03-12 DIAGNOSIS — Z1239 Encounter for other screening for malignant neoplasm of breast: Secondary | ICD-10-CM

## 2018-03-12 DIAGNOSIS — F339 Major depressive disorder, recurrent, unspecified: Secondary | ICD-10-CM

## 2018-03-12 DIAGNOSIS — M19021 Primary osteoarthritis, right elbow: Secondary | ICD-10-CM

## 2018-03-12 DIAGNOSIS — I10 Essential (primary) hypertension: Secondary | ICD-10-CM

## 2018-03-12 DIAGNOSIS — M79644 Pain in right finger(s): Secondary | ICD-10-CM

## 2018-03-12 DIAGNOSIS — G8929 Other chronic pain: Secondary | ICD-10-CM

## 2018-03-12 DIAGNOSIS — I110 Hypertensive heart disease with heart failure: Secondary | ICD-10-CM

## 2018-03-12 DIAGNOSIS — F3342 Major depressive disorder, recurrent, in full remission: Secondary | ICD-10-CM

## 2018-03-12 DIAGNOSIS — I2510 Atherosclerotic heart disease of native coronary artery without angina pectoris: Secondary | ICD-10-CM

## 2018-03-12 DIAGNOSIS — M19022 Primary osteoarthritis, left elbow: Secondary | ICD-10-CM

## 2018-03-12 LAB — GLUCOSE, CAPILLARY: GLUCOSE-CAPILLARY: 173 mg/dL — AB (ref 65–99)

## 2018-03-12 MED ORDER — FUROSEMIDE 40 MG PO TABS: 40.0000 mg | ORAL_TABLET | Freq: Two times a day (BID) | ORAL | 3 refills | Status: DC

## 2018-03-12 MED ORDER — MELOXICAM 15 MG PO TABS: 15.0000 mg | ORAL_TABLET | Freq: Every day | ORAL | 0 refills | Status: DC

## 2018-03-12 MED ORDER — DULOXETINE HCL 30 MG PO CPEP: 30.0000 mg | ORAL_CAPSULE | Freq: Two times a day (BID) | ORAL | 6 refills | Status: DC

## 2018-03-12 NOTE — Assessment & Plan Note (Addendum)
She is following with GYN, her gynecologist have to reschedule her appointment which was last week.  Next appointment is in April. She is not having any more post menopausal bleeding at this time.  Was advised to follow-up with GYN.

## 2018-03-12 NOTE — Progress Notes (Signed)
   CC: Bilateral thumb pain right worse than left.  HPI:  [name] is a 57 y.o. with past medical history as listed below came to the clinic with complaint of worsening  bilateral thumb pain right worse than left.  Patient is involved a lot of typing and cutting vegetable and her pain interferes with those activities.  Pain is associated with mild numbness along the lateral side of her thumb, denies any focal weakness or muscle wasting.  Pain is worse with repetitive movements, occasionally woke her up at night, ibuprofen cause partial relief.  Patient has an history of osteoarthritis in her knees and elbows.  Please see assessment and plan for her chronic conditions.  Past Medical History:  Diagnosis Date  . Anemia   . Aortic valve stenosis, severe   . 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 Asthma   . Blood transfusion    2013[facility]  . CAD (coronary artery disease)    Cath 2010 with DES x 1 RCA-- PT'S CARDIOLOGIST IS DR. Snover  . Chronic diastolic congestive heart failure ([place])   . COPD (chronic obstructive pulmonary disease) ([place])   . Depression   . Diabetes mellitus DIAGNOSED IN2010   [facility]  . Eczema    on back  . Hyperlipidemia   . Hypertension   . Kidney stones   . Morbid obesity with body mass index of 60.0-69.9 in adult [facility])   . Myocardial infarction ([place])    PT THINKS SHE WAS DX WITH MI AT THE TIME OF HEART STENTING  . Pulmonary embolism ([place]) 02/08/12   S/P RT TOTAL KNEE ON 02/03/12--ON 02/08/12--DEVELOPED ACUTE SOB AND CHEST PAIN--AND DIAGNOSED WITH  PULMONARY EMBOLUS AND PNEUMONIA  . Restless leg syndrome   . Sleep apnea    uses 3 liters O2 at night   . Uterine fibroid    NO PROBLEMS AT PRESENT FROM THE FIBROIDS-STATES SHE IS POST MENOPAUSAL-LAST MENSES 2010 EXCEPT FOR EPISODE THIS YR OF BLEEDING RELATED TO FIBROIDS.  Marland Kitchen Weakness    BOTH HANDS  - S/P BILATERAL CARPAL TUNNEL RELEASE--BUT STILL HAS WEAKNESS--OFTEN DROPS THINGS   Review of Systems: Negative except mentioned in HPI.  Physical Exam:  Vitals:   03/12/18 1543  BP: (!) 129/54  Pulse: 96  Temp: 98.1 F (36.7 C)  SpO2: 97%  Weight: (!) 337 lb 6.4 oz (153 kg)    General: Vital signs reviewed.  Patient is well-developed and morbidly obese, in no acute distress and cooperative with exam.  Head: Normocephalic and atraumatic. Eyes: EOMI, conjunctivae normal, no scleral icterus.  Cardiovascular: RRR, distant heart sounds due to body habitus. Pulmonary/Chest: Clear to auscultation bilaterally, no wheezes, rales, or rhonchi. Abdominal: Soft, non-tender, non-distended, BS +, no masses, organomegaly, or guarding present.  Musculoskeletal: Mild tenderness along lateral side of both thumbs, no muscle wasting, normal range of motion, no focal deficit. Extremities: No lower extremity edema bilaterally,  pulses symmetric and intact bilaterally. No cyanosis or clubbing. Skin: Warm, dry and intact. No rashes or erythema. Psychiatric: Normal mood and affect. speech and behavior is normal. Cognition and memory are normal.  Assessment & Plan:   See Encounters Tab for problem based charting.  Patient discussed with Dr. Dareen Piano.

## 2018-03-12 NOTE — Assessment & Plan Note (Signed)
Likely musculoskeletal with overuse. No red flag.  She was given a prescription of Mobic 15 mg daily for 7 days as scheduled to decrease inflammation, after that she can take it as needed.

## 2018-03-12 NOTE — Assessment & Plan Note (Addendum)
Currently diet controlled with A1c of 6.8.  We will repeat A1c during next follow-up visit. She was provided a referral for eye exam.

## 2018-03-12 NOTE — Assessment & Plan Note (Signed)
She do not want to have a colonoscopy done at this time, according to her she had one 6-7 years ago-no record found.  She was provided with stool card for FOBT.

## 2018-03-12 NOTE — Assessment & Plan Note (Signed)
Order for mammogram was placed. 

## 2018-03-12 NOTE — Patient Instructions (Addendum)
Thank you for visiting clinic today. I am giving you a new pain medicine called Mobic or meloxicam-please take it daily for 7 days and then as needed for your thumb pain. As we discussed I am also switching you to Cymbalta from citalopram, please take half tablet of citalopram for a few days, then give a break of 2 days before starting 30 mg daily of Cymbalta.  You will take 30 mg of Cymbalta for 1 week and then increase it to twice daily for total of 60 mg. Please follow-up in 3 month.   Diabetes Mellitus and Nutrition When you have diabetes (diabetes mellitus), it is very important to have healthy eating habits because your blood sugar (glucose) levels are greatly affected by what you eat and drink. Eating healthy foods in the appropriate amounts, at about the same times every day, can help you:  Control your blood glucose.  Lower your risk of heart disease.  Improve your blood pressure.  Reach or maintain a healthy weight.  Every person with diabetes is different, and each person has different needs for a meal plan. Your health care provider may recommend that you work with a diet and nutrition specialist (dietitian) to make a meal plan that is best for you. Your meal plan may vary depending on factors such as:  The calories you need.  The medicines you take.  Your weight.  Your blood glucose, blood pressure, and cholesterol levels.  Your activity level.  Other health conditions you have, such as heart or kidney disease.  How do carbohydrates affect me? Carbohydrates affect your blood glucose level more than any other type of food. Eating carbohydrates naturally increases the amount of glucose in your blood. Carbohydrate counting is a method for keeping track of how many carbohydrates you eat. Counting carbohydrates is important to keep your blood glucose at a healthy level, especially if you use insulin or take certain oral diabetes medicines. It is important to know how many  carbohydrates you can safely have in each meal. This is different for every person. Your dietitian can help you calculate how many carbohydrates you should have at each meal and for snack. Foods that contain carbohydrates include:  Bread, cereal, rice, pasta, and crackers.  Potatoes and corn.  Peas, beans, and lentils.  Milk and yogurt.  Fruit and juice.  Desserts, such as cakes, cookies, ice cream, and candy.  How does alcohol affect me? Alcohol can cause a sudden decrease in blood glucose (hypoglycemia), especially if you use insulin or take certain oral diabetes medicines. Hypoglycemia can be a life-threatening condition. Symptoms of hypoglycemia (sleepiness, dizziness, and confusion) are similar to symptoms of having too much alcohol. If your health care provider says that alcohol is safe for you, follow these guidelines:  Limit alcohol intake to no more than 1 drink per day for nonpregnant women and 2 drinks per day for men. One drink equals 12 oz of beer, 5 oz of wine, or 1 oz of hard liquor.  Do not drink on an empty stomach.  Keep yourself hydrated with water, diet soda, or unsweetened iced tea.  Keep in mind that regular soda, juice, and other mixers may contain a lot of sugar and must be counted as carbohydrates.  What are tips for following this plan? Reading food labels  Start by checking the serving size on the label. The amount of calories, carbohydrates, fats, and other nutrients listed on the label are based on one serving of the food. Many  foods contain more than one serving per package.  Check the total grams (g) of carbohydrates in one serving. You can calculate the number of servings of carbohydrates in one serving by dividing the total carbohydrates by 15. For example, if a food has 30 g of total carbohydrates, it would be equal to 2 servings of carbohydrates.  Check the number of grams (g) of saturated and trans fats in one serving. Choose foods that have low  or no amount of these fats.  Check the number of milligrams (mg) of sodium in one serving. Most people should limit total sodium intake to less than 2,300 mg per day.  Always check the nutrition information of foods labeled as "low-fat" or "nonfat". These foods may be higher in added sugar or refined carbohydrates and should be avoided.  Talk to your dietitian to identify your daily goals for nutrients listed on the label. Shopping  Avoid buying canned, premade, or processed foods. These foods tend to be high in fat, sodium, and added sugar.  Shop around the outside edge of the grocery store. This includes fresh fruits and vegetables, bulk grains, fresh meats, and fresh dairy. Cooking  Use low-heat cooking methods, such as baking, instead of high-heat cooking methods like deep frying.  Cook using healthy oils, such as olive, canola, or sunflower oil.  Avoid cooking with butter, cream, or high-fat meats. Meal planning  Eat meals and snacks regularly, preferably at the same times every day. Avoid going long periods of time without eating.  Eat foods high in fiber, such as fresh fruits, vegetables, beans, and whole grains. Talk to your dietitian about how many servings of carbohydrates you can eat at each meal.  Eat 4-6 ounces of lean protein each day, such as lean meat, chicken, fish, eggs, or tofu. 1 ounce is equal to 1 ounce of meat, chicken, or fish, 1 egg, or 1/4 cup of tofu.  Eat some foods each day that contain healthy fats, such as avocado, nuts, seeds, and fish. Lifestyle   Check your blood glucose regularly.  Exercise at least 30 minutes 5 or more days each week, or as told by your health care provider.  Take medicines as told by your health care provider.  Do not use any products that contain nicotine or tobacco, such as cigarettes and e-cigarettes. If you need help quitting, ask your health care provider.  Work with a Social worker or diabetes educator to identify  strategies to manage stress and any emotional and social challenges. What are some questions to ask my health care provider?  Do I need to meet with a diabetes educator?  Do I need to meet with a dietitian?  What number can I call if I have questions?  When are the best times to check my blood glucose? Where to find more information:  American Diabetes Association: diabetes.org/food-and-fitness/food  Academy of Nutrition and Dietetics: PokerClues.dk  [employer] of Diabetes and Digestive and Kidney Diseases (NIH): ContactWire.be Summary  A healthy meal plan will help you control your blood glucose and maintain a healthy lifestyle.  Working with a diet and nutrition specialist (dietitian) can help you make a meal plan that is best for you.  Keep in mind that carbohydrates and alcohol have immediate effects on your blood glucose levels. It is important to count carbohydrates and to use alcohol carefully. This information is not intended to replace advice given to you by your health care provider. Make sure you discuss any questions you have with  your health care provider. Document Released: 09/11/2005 Document Revised: 01/19/2017 Document Reviewed: 01/19/2017 Elsevier Interactive Patient Education  [employer].

## 2018-03-12 NOTE — Assessment & Plan Note (Signed)
She denies any chest pain or exertional dyspnea.  Continue aspirin, Plavix, metoprolol and as needed nitroglycerin.

## 2018-03-12 NOTE — Assessment & Plan Note (Signed)
Her symptoms are well controlled on citalopram 20 mg daily for many years, she is unable to get citalopram through her [facility] as it is no more on their formulary.  After long discussion with patient it was decided to switch her with Cymbalta as it will help with her chronic pain. Patient was advised to take half tablet of her citalopram 20 mg for a few days, then stop it for 2 days before starting Cymbalta 30 mg daily.  She will take 30 mg daily for 1 week and then increase it to 60 mg daily.

## 2018-03-12 NOTE — Assessment & Plan Note (Signed)
BP Readings from Last 3 Encounters:  03/12/18 (!) 129/54  02/09/18 (!) 144/81  02/03/18 (!) 154/71    She was normotensive today. Continue current management with losartan.

## 2018-03-12 NOTE — Assessment & Plan Note (Signed)
She denies any worsening dyspnea, orthopnea or PND. No lower extremity edema.  Continue current dose of Lasix at 40 mg twice daily, patient normally takes once daily.

## 2018-03-15 ENCOUNTER — Telehealth: Payer: Self-pay | Admitting: Internal Medicine

## 2018-03-15 NOTE — Telephone Encounter (Signed)
Pharmacist calls from [facility] to inform dr Reesa Chew that there is a severe interaction of plavix and meloxicam. Please advise

## 2018-03-15 NOTE — Telephone Encounter (Signed)
The both can increase the chance of bleeding, she can stop taking Plavix for the week while she was taking meloxicam.

## 2018-03-15 NOTE — Telephone Encounter (Signed)
[facility] would like a nurse to call back, they will be holding the medicine until someone call them

## 2018-03-15 NOTE — Progress Notes (Signed)
Internal Medicine Clinic Attending  Case discussed with Dr. Amin at the time of the visit.  We reviewed the resident's history and exam and pertinent patient test results.  I agree with the assessment, diagnosis, and plan of care documented in the resident's note.    

## 2018-03-18 NOTE — Telephone Encounter (Signed)
I give her 30 pills and told her she can take it as a as needed basis after 7 days of scheduled.

## 2018-03-18 NOTE — Telephone Encounter (Signed)
Dr Reesa Chew, is she to take this for just 7 days? You prescribed #30.

## 2018-03-19 NOTE — Telephone Encounter (Signed)
There can be a slight increased chance of bleeding with the combination. I would defer to Dr. Reesa Chew as to the reason for this combination and if she believes this is necessary.

## 2018-03-19 NOTE — Telephone Encounter (Signed)
All NSAID is going to increase her chance of bleeding with Plavix. We did discuss with her regarding this possible side effect.

## 2018-03-19 NOTE — Telephone Encounter (Signed)
Gave answer to pharmacist 

## 2018-03-19 NOTE — Telephone Encounter (Signed)
I gave last message to [facility] pharmacist, she states that she could see stopping the plavix for 7 days to do the meloxicam but her concern is the other 23 tablets to be taken as needed after restarting the plavix.  Please advise Sending to attending and dr Reesa Chew

## 2018-03-22 ENCOUNTER — Ambulatory Visit (INDEPENDENT_AMBULATORY_CARE_PROVIDER_SITE_OTHER): Payer: No Typology Code available for payment source | Admitting: Cardiovascular Disease

## 2018-03-22 ENCOUNTER — Encounter: Payer: Self-pay | Admitting: Cardiovascular Disease

## 2018-03-22 ENCOUNTER — Ambulatory Visit: Payer: Self-pay | Admitting: Cardiovascular Disease

## 2018-03-22 ENCOUNTER — Telehealth: Payer: Self-pay | Admitting: General Practice

## 2018-03-22 VITALS — BP 120/60 | HR 65 | Ht 62.5 in | Wt 338.4 lb

## 2018-03-22 DIAGNOSIS — I2510 Atherosclerotic heart disease of native coronary artery without angina pectoris: Secondary | ICD-10-CM

## 2018-03-22 DIAGNOSIS — E7800 Pure hypercholesterolemia, unspecified: Secondary | ICD-10-CM

## 2018-03-22 DIAGNOSIS — Z953 Presence of xenogenic heart valve: Secondary | ICD-10-CM

## 2018-03-22 DIAGNOSIS — I350 Nonrheumatic aortic (valve) stenosis: Secondary | ICD-10-CM

## 2018-03-22 NOTE — Progress Notes (Deleted)
No chief complaint on file.   History of Present Illness: 57 yo female with history of severe aortic stenosis now s/p TAVR,  CAD, OSA, HTN, HLD, obesity, GERD, asthma, COPD, former tobacco abuse, depression here today for cardiac follow up. She had a cardiac cath in February 2010 and had a drug eluting stent placed in the distal RCA. Her LAD and Circumflex were free of disease. She had her right knee replaced in February 5956, complicated by a PE. She was started on coumadin and treated for 6 months. CTA chest on 08/09/12 showed no evidence of PE so her coumadin was stopped. I saw her in February 2014 and she c/o severe dyspnea with minimal exertion. I felt that this was multi-factorial. Chest CTA with no PE. I arranged a stress myoview which showed possible ischemia. Echo with severe AS, normal LV function. Cardiac cath on 03/17/13 with mild non-obstructive disease and patent stent RCA. She continued to have severe dyspnea. Most recent cath 09/14/14 with 70% ostial RCA stenosis treated medically and no obstructive disease in the LAD or Circumflex. She underwent TAVR on 12/12/14 and did well. (26 mm Sapien 3 bioprosthetic valve). Echo December 2016 with normally functioning bioprosthetic aortic valve.   She is here today for follow up. The patient denies any chest pain, dyspnea, palpitations, lower extremity edema, orthopnea, PND, dizziness, near syncope or syncope.   Primary Care Physician: Lorella Nimrod, MD  Past Medical History:  Diagnosis Date  . Anemia   . Aortic valve stenosis, severe   . 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 Asthma   . Blood transfusion    2013[facility]  . CAD (coronary artery disease)    Cath 2010 with DES x 1 RCA-- PT'S CARDIOLOGIST IS DR. [place]  . Chronic diastolic congestive heart failure ([place])   . COPD (chronic obstructive pulmonary disease) ([place])   .  Depression   . Diabetes mellitus DIAGNOSED IN2010   [facility]  . Eczema    on back  . Hyperlipidemia   . Hypertension   . Kidney stones   . Morbid obesity with body mass index of 60.0-69.9 in adult [facility])   . Myocardial infarction ([place])    PT THINKS SHE WAS DX WITH MI AT THE TIME OF HEART STENTING  . Pulmonary embolism ([place]) 02/08/12   S/P RT TOTAL KNEE ON 02/03/12--ON 02/08/12--DEVELOPED ACUTE SOB AND CHEST PAIN--AND DIAGNOSED WITH  PULMONARY EMBOLUS AND PNEUMONIA  . Restless leg syndrome   . Sleep apnea    uses 3 liters O2 at night   . Uterine fibroid    NO PROBLEMS AT PRESENT FROM THE FIBROIDS-STATES SHE IS POST MENOPAUSAL-LAST MENSES 2010 EXCEPT FOR EPISODE THIS YR OF BLEEDING RELATED TO FIBROIDS.  Marland Kitchen Weakness    BOTH HANDS - S/P BILATERAL CARPAL TUNNEL RELEASE--BUT STILL HAS WEAKNESS--OFTEN DROPS THINGS    Past Surgical History:  Procedure Laterality Date  . CARDIAC CATHETERIZATION    . CARPAL TUNNEL RELEASE     Bilateral  . CHOLECYSTECTOMY    . CORONARY ANGIOPLASTY     2010 has stent in place  . CYSTOSCOPY W/ RETROGRADES Right 09/21/2013   Procedure: CYSTOSCOPY WITH RIGHT RETROGRADE PYELOGRAM RIGHT DOUBLE J STENT ;  Surgeon: Fredricka Bonine, MD;  Location: [facility];  Service: Urology;  Laterality: Right;  . CYSTOSCOPY WITH URETEROSCOPY AND STENT  PLACEMENT Right 10/25/2013   Procedure: CYSTOSCOPY RIGHT URETEROSCOPY HOLMIUM LASER LITHO AND STENT PLACEMENT;  Surgeon: Fredricka Bonine, MD;  Location: [facility];  Service: Urology;  Laterality: Right;  . HERNIA REPAIR    . INTRAOPERATIVE TRANSESOPHAGEAL ECHOCARDIOGRAM N/A 12/12/2014   Procedure: INTRAOPERATIVE TRANSESOPHAGEAL ECHOCARDIOGRAM;  Surgeon: Burnell Blanks, MD;  Location: [facility] OR;  Service: Cardiovascular;  Laterality: N/A;  . JOINT REPLACEMENT     bil total knees  . KNEE ARTHROPLASTY  02/03/2012   Procedure: COMPUTER ASSISTED TOTAL KNEE ARTHROPLASTY;  Surgeon: Mcarthur Rossetti, MD;  Location: [place];   Service: Orthopedics;  Laterality: Right;  Right total knee arthroplasty  . LEFT AND RIGHT HEART CATHETERIZATION WITH CORONARY ANGIOGRAM N/A 03/17/2013   Procedure: LEFT AND RIGHT HEART CATHETERIZATION WITH CORONARY ANGIOGRAM;  Surgeon: Burnell Blanks, MD;  Location: [facility] CATH LAB;  Service: Cardiovascular;  Laterality: N/A;  . LEFT AND RIGHT HEART CATHETERIZATION WITH CORONARY/GRAFT ANGIOGRAM N/A 09/14/2014   Procedure: LEFT AND RIGHT HEART CATHETERIZATION WITH Beatrix Fetters;  Surgeon: Burnell Blanks, MD;  Location: [facility] CATH LAB;  Service: Cardiovascular;  Laterality: N/A;  . TEE WITHOUT CARDIOVERSION N/A 03/14/2013   Procedure: TRANSESOPHAGEAL ECHOCARDIOGRAM (TEE);  Surgeon: Lelon Perla, MD;  Location: [facility] ENDOSCOPY;  Service: Cardiovascular;  Laterality: N/A;  . TEE WITHOUT CARDIOVERSION N/A 11/14/2014   Procedure: TRANSESOPHAGEAL ECHOCARDIOGRAM (TEE);  Surgeon: Lelon Perla, MD;  Location: [facility] ENDOSCOPY;  Service: Cardiovascular;  Laterality: N/A;  . TOTAL KNEE ARTHROPLASTY  09/10/2012   Procedure: TOTAL KNEE ARTHROPLASTY;  Surgeon: Mcarthur Rossetti, MD;  Location: [facility];  Service: Orthopedics;  Laterality: Left;  . TOTAL KNEE REVISION Right 07/15/2013   Procedure: REVISION ARTHROPLASTY RIGHT KNEE;  Surgeon: Mcarthur Rossetti, MD;  Location: [facility];  Service: Orthopedics;  Laterality: Right;  . TRANSCATHETER AORTIC VALVE REPLACEMENT, TRANSFEMORAL N/A 12/12/2014   Procedure: TRANSCATHETER AORTIC VALVE REPLACEMENT, TRANSFEMORAL;  Surgeon: Burnell Blanks, MD;  Location: [place];  Service: Cardiovascular;  Laterality: N/A;  . TRIGGER FINGER RELEASE  09/10/2012   Procedure: RELEASE TRIGGER FINGER/A-1 PULLEY;  Surgeon: Mcarthur Rossetti, MD;  Location: [facility];  Service: Orthopedics;  Laterality: Right;  Right Ring Finger  . TUBAL LIGATION      Current Outpatient Medications  Medication Sig Dispense Refill  . albuterol (PROVENTIL HFA;VENTOLIN HFA)  108 (90 Base) MCG/ACT inhaler Inhale 2 puffs into the lungs every 4 (four) hours as needed for wheezing or shortness of breath. 2 Inhaler 3  . albuterol (PROVENTIL) (2.5 MG/3ML) 0.083% nebulizer solution Take 3 mLs (2.5 mg total) by nebulization every 4 (four) hours as needed for wheezing or shortness of breath. 150 mL 0  . aspirin EC 81 MG tablet Take 1 tablet (81 mg total) by mouth daily. 90 tablet 3  . Aspirin-Acetaminophen-Caffeine (GOODY HEADACHE PO) Take 1 Package by mouth daily as needed.    Marland Kitchen atorvastatin (LIPITOR) 80 MG tablet Take 1 tablet (80 mg total) by mouth daily. 90 tablet 3  . clopidogrel (PLAVIX) 75 MG tablet Take 1 tablet (75 mg total) by mouth every morning. 90 tablet 1  . Diclofenac Sodium 3 % CREA Apply 1 Dose topically 2 (two) times daily. 1 g 1  . DULoxetine (CYMBALTA) 30 MG capsule Take 1 capsule (30 mg total) by mouth 2 (two) times daily. 60 capsule 6  . ezetimibe (ZETIA) 10 MG tablet Take 1 tablet (10 mg total) by mouth daily. 90 tablet 3  . Fluticasone-Umeclidin-Vilant (TRELEGY ELLIPTA) 100-62.5-25 MCG/INH AEPB  Inhale 1 puff into the lungs daily. 1 each 11  . furosemide (LASIX) 40 MG tablet Take 1 tablet (40 mg total) by mouth 2 (two) times daily. As needed for swelling 60 tablet 3  . gabapentin (NEURONTIN) 300 MG capsule Take 3 capsules (900 mg total) by mouth at bedtime. 90 capsule 1  . glucose blood (WAVESENSE PRESTO) test strip Use as instructed 100 each 12  . ibuprofen (ADVIL,MOTRIN) 200 MG tablet Take 400 mg by mouth every 6 (six) hours as needed for moderate pain.    Marland Kitchen losartan (COZAAR) 100 MG tablet Take 1 tablet (100 mg total) by mouth daily. 90 tablet 1  . meloxicam (MOBIC) 15 MG tablet Take 1 tablet (15 mg total) by mouth daily. 30 tablet 0  . metoprolol tartrate (LOPRESSOR) 25 MG tablet Take 1 tablet (25 mg total) by mouth 2 (two) times daily. 180 tablet 1  . nitroGLYCERIN (NITROSTAT) 0.4 MG SL tablet Place 1 tablet (0.4 mg total) under the tongue every 5  (five) minutes as needed for chest pain. 25 tablet 6  . OXYGEN-HELIUM IN Inhale 3 L into the lungs daily.     . potassium chloride SA (K-DUR,KLOR-CON) 20 MEQ tablet Take 2 tablets (40 mEq total) by mouth at bedtime. DO NOT TAKE IF YOU DO NOT TAKE FUROSEMIDE (LASIX) 60 tablet 0   No current facility-administered medications for this visit.     No Known Allergies  Social History   Socioeconomic History  . Marital status: Married    Spouse name: Not on file  . Number of children: 2  . Years of education: Not on file  . Highest education level: Not on file  Occupational History  . Occupation: Disabled  Social Needs  . Financial resource strain: Not on file  . Food insecurity:    Worry: Not on file    Inability: Not on file  . Transportation needs:    Medical: Not on file    Non-medical: Not on file  Tobacco Use  . Smoking status: Former Smoker    Packs/day: 1.50    Years: 30.00    Pack years: 45.00    Types: Cigarettes    Last attempt to quit: 12/29/2000    Years since quitting: 17.2  . Smokeless tobacco: Never Used  Substance and Sexual Activity  . Alcohol use: Yes    Alcohol/week: 0.6 oz    Types: 1 Cans of beer per week    Comment: rarely  . Drug use: No  . Sexual activity: Never    Birth control/protection: Surgical  Lifestyle  . Physical activity:    Days per week: Not on file    Minutes per session: Not on file  . Stress: Not on file  Relationships  . Social connections:    Talks on phone: Not on file    Gets together: Not on file    Attends religious service: Not on file    Active member of club or organization: Not on file    Attends meetings of clubs or organizations: Not on file    Relationship status: Not on file  . Intimate partner violence:    Fear of current or ex partner: Not on file    Emotionally abused: Not on file    Physically abused: Not on file    Forced sexual activity: Not on file  Other Topics Concern  . Not on file  Social History  Narrative  . Not on file    Family History  Problem Relation Age of Onset  . Breast cancer Mother   . Emphysema Mother        smoked  . Heart disease Mother   . COPD Father        smoked  . Asthma Father   . Heart disease Father   . Cancer Brother        Sinus    Review of Systems:  As stated in the HPI and otherwise negative.   LMP 02/03/2012   Physical Examination:  General: Well developed, well nourished, NAD  HEENT: OP clear, mucus membranes moist  SKIN: warm, dry. No rashes. Neuro: No focal deficits  Musculoskeletal: Muscle strength 5/5 all ext  Psychiatric: Mood and affect normal  Neck: No JVD, no carotid bruits, no thyromegaly, no lymphadenopathy.  Lungs:Clear bilaterally, no wheezes, rhonci, crackles Cardiovascular: Regular rate and rhythm. No murmurs, gallops or rubs. Abdomen:Soft. Bowel sounds present. Non-tender.  Extremities: No lower extremity edema. Pulses are 2 + in the bilateral DP/PT.  Cardiac cath 09/14/14: Left main: No obstructive disease.  Left Anterior Descending Artery: Large caliber vessel that courses to the apex. There is a moderate caliber diagonal branch. No obstructive disease noted.  Circumflex Artery: Large caliber vessel with two small obtuse marginal branches and a moderate caliber posterolateral branch. No obstructive disease noted.  Right Coronary Artery: Large dominant vessel with 70-80% ostial stenosis. 40% proximal stenosis. Distal stented segment is patent with no restenosis.  Left Ventricular Angiogram: Deferred.    Echo December 2016: Left ventricle: The cavity size was normal. There was mild   concentric hypertrophy. Systolic function was vigorous. The   estimated ejection fraction was in the range of 65% to 70%. Wall   motion was normal; there were no regional wall motion   abnormalities. Doppler parameters are consistent with abnormal   left ventricular relaxation (grade 1 diastolic dysfunction).   Doppler parameters are  consistent with elevated ventricular   end-diastolic filling pressure. - Aortic valve: A bioprosthetic Edward-SAPIEN valve is seated well   in the artic position, leaflets are poorly visualized.   There is trivial aortic regurgitation. Transaortic gradients are   elevated with mean 30 mmHg, peak gradient 42 mmHg. Trileaflet;   normal thickness leaflets. - Aortic root: The aortic root was normal in size. - Mitral valve: Calcified annulus. Mildly thickened leaflets . - Left atrium: The atrium was mildly dilated. - Right atrium: The atrium was normal in size. - Tricuspid valve: There was no regurgitation. - Pulmonary arteries: Systolic pressure was within the normal   range. - Inferior vena cava: The vessel was normal in size. - Pericardium, extracardiac: There was no pericardial effusion.  Impressions:  - A bioprosthetic Edward-SAPIEN valve is seated well in the artic   position, leaflets are poorly visualized.   There is trivial aortic regurgitation. Transaortic gradients are   elevated with mean 30 mmHg, peak gradient 42 mmHg.   The gradients are slightly elevated when compared to the prior   study from 02/07/2015 (mean 25 mmHg, peak 38 mmHg).  EKG:  EKG is *** ordered today. The ekg ordered today demonstrates    Recent Labs: 01/25/2018: BUN 8; Creatinine, Ser 0.72; Hemoglobin 11.7; Platelets 233; Potassium 4.5; Sodium 138   Lipid Panel    Component Value Date/Time   CHOL 213 (H) 12/28/2015 1052   TRIG 100 12/28/2015 1052   HDL 58 12/28/2015 1052   CHOLHDL 3.7 12/28/2015 1052   VLDL 20 12/28/2015 1052   LDLCALC 135 (H) 12/28/2015  1052   LDLDIRECT 162.0 10/17/2013 0905     Wt Readings from Last 3 Encounters:  03/12/18 (!) 337 lb 6.4 oz (153 kg)  02/09/18 (!) 337 lb 3.2 oz (153 kg)  02/03/18 (!) 338 lb 3.2 oz (153.4 kg)     Other studies Reviewed: Additional studies/ records that were reviewed today include: . Review of the above records demonstrates:     Assessment and Plan:   1. Severe Aortic valve stenosis: She is s/p TAVR on 12/12/14. Her valve was working well by last echo in 2016. She is on ASA and Plavix. She is aware that she should use antibiotic prophylaxis before certain procedures including dental visits.   2. CAD without angina: Cardiac cath September 2015 with moderate ostial RCA stenosis and no disease in the LAD or Circumflex. No chest pain. Will continue ASA, Plavix, statin, beta blocker and ARB.    3. Dyspnea: Her dyspnea has been felt to be multifactorial. She has morbid obesity, deconditioning, CAD, asthma and chronic thromboembolic disease. She is now on supplemental oxygen 24/7.  4. H/O Pulmonary embolism: No residual PE on CTA 02/21/13. She has been off of coumadin.   5. Hyperlipidemia: Followed in primary care.  She is on a statin.   6. Morbid obesity: Weight loss is encouraged.   Current medicines are reviewed at length with the patient today.  The patient does not have concerns regarding medicines.  The following changes have been made:  no change  Labs/ tests ordered today include:   No orders of the defined types were placed in this encounter.   Disposition:   FU with me in 12  months  Signed, Lauree Chandler, MD 03/22/2018 5:25 AM    [facility] [address], [place], [place]  [address] Phone: [phone]; Fax: [phone]

## 2018-03-22 NOTE — Patient Instructions (Signed)
Medication Instructions:  Your physician recommends that you continue on your current medications as directed. Please refer to the Current Medication list given to you today.   Labwork: Your physician recommends that you return for lab work on March 27,2019.  (lipid and liver profiles). This will be fasting. The lab opens at 7:30 AM   Testing/Procedures: none  Follow-Up: Your physician recommends that you schedule a follow-up appointment in: 12 months. Please call our office in about 9 months to schedule this appointment    Any Other Special Instructions Will Be Listed Below (If Applicable).     If you need a refill on your cardiac medications before your next appointment, please call your pharmacy.

## 2018-03-22 NOTE — Telephone Encounter (Signed)
Patient called and left message on voicemail line stating she has an appt coming up for a pap smear and doesn't know when to come for her appt. Called patient, no answer- left message on voicemail with appt date/time & to call back with any questions she may have.

## 2018-03-22 NOTE — Progress Notes (Signed)
Chief Complaint  Patient presents with  . Follow-up    CAD    History of Present Illness: 57 yo female with history of severe aortic stenosis now s/p TAVR,  CAD, OSA, HTN, HLD, obesity, GERD, asthma, COPD, former tobacco abuse, depression here today for cardiac follow up. She had a cardiac cath in February 2010 and had a drug eluting stent (2.6mm Xience) placed in the distal RCA. Her LAD and Circumflex were free of disease. She had her right knee replaced in February 4970, complicated by a PE. She was started on coumadin and treated for 6 months. CTA chest on 08/09/12 showed no evidence of PE so her coumadin was stopped. I saw her in February 2014 and she c/o severe dyspnea with minimal exertion. I felt that this was multi-factorial. Chest CTA with no PE. I arranged a stress myoview which showed possible ischemia. Echo with severe AS, normal LV function. Cardiac cath on 03/17/13 with mild non-obstructive disease and patent stent RCA. She continued to have severe dyspnea. Most recent cath 09/14/14 with 70% ostial RCA stenosis treated medically and no obstructive disease in the LAD or Circumflex. She underwent TAVR on 12/12/14 and did well. (26 mm Sapien 3 bioprosthetic valve). Echo December 2016 with normally functioning bioprosthetic aortic valve.   She is here today for follow up. The patient denies any chest pain, palpitations, lower extremity edema, orthopnea, PND, dizziness, near syncope or syncope. She has chronic dyspnea but no recent change. Weight down 20 lbs over last 3 years.  .  Primary Care Physician: Lorella Nimrod, MD  Past Medical History:  Diagnosis Date  . Anemia   . Aortic valve stenosis, severe   . 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 Asthma   . Blood transfusion    2013[facility]  . CAD (coronary artery disease)    Cath 2010 with DES x 1 RCA-- PT'S CARDIOLOGIST IS DR.  [place]  . Chronic diastolic congestive heart failure ([place])   . COPD (chronic obstructive pulmonary disease) ([place])   . Depression   . Diabetes mellitus DIAGNOSED IN2010   ORAL MEDS  . Eczema    on back  . Hyperlipidemia   . Hypertension   . Kidney stones   . Morbid obesity with body mass index of 60.0-69.9 in adult [facility])   . Myocardial infarction ([place])    PT THINKS SHE WAS DX WITH MI AT THE TIME OF HEART STENTING  . Pulmonary embolism ([place]) 02/08/12   S/P RT TOTAL KNEE ON 02/03/12--ON 02/08/12--DEVELOPED ACUTE SOB AND CHEST PAIN--AND DIAGNOSED WITH  PULMONARY EMBOLUS AND PNEUMONIA  . Restless leg syndrome   . Sleep apnea    uses 3 liters O2 at night   . Uterine fibroid    NO PROBLEMS AT PRESENT FROM THE FIBROIDS-STATES SHE IS POST MENOPAUSAL-LAST MENSES 2010 EXCEPT FOR EPISODE THIS YR OF BLEEDING RELATED TO FIBROIDS.  Marland Kitchen Weakness    BOTH HANDS - S/P BILATERAL CARPAL TUNNEL RELEASE--BUT STILL HAS WEAKNESS--OFTEN DROPS THINGS    Past Surgical History:  Procedure Laterality Date  . CARDIAC CATHETERIZATION    . CARPAL TUNNEL RELEASE     Bilateral  . CHOLECYSTECTOMY    . CORONARY ANGIOPLASTY     2010 has stent in place  . CYSTOSCOPY W/ RETROGRADES Right 09/21/2013   Procedure: CYSTOSCOPY WITH RIGHT RETROGRADE PYELOGRAM RIGHT  DOUBLE J STENT ;  Surgeon: Fredricka Bonine, MD;  Location: [facility];  Service: Urology;  Laterality: Right;  . CYSTOSCOPY WITH URETEROSCOPY AND STENT PLACEMENT Right 10/25/2013   Procedure: CYSTOSCOPY RIGHT URETEROSCOPY HOLMIUM LASER LITHO AND STENT PLACEMENT;  Surgeon: Fredricka Bonine, MD;  Location: [facility];  Service: Urology;  Laterality: Right;  . HERNIA REPAIR    . INTRAOPERATIVE TRANSESOPHAGEAL ECHOCARDIOGRAM N/A 12/12/2014   Procedure: INTRAOPERATIVE TRANSESOPHAGEAL ECHOCARDIOGRAM;  Surgeon: Burnell Blanks, MD;  Location: [facility] OR;  Service: Cardiovascular;  Laterality: N/A;  . JOINT REPLACEMENT     bil total knees  . KNEE ARTHROPLASTY   02/03/2012   Procedure: COMPUTER ASSISTED TOTAL KNEE ARTHROPLASTY;  Surgeon: Mcarthur Rossetti, MD;  Location: [place];  Service: Orthopedics;  Laterality: Right;  Right total knee arthroplasty  . LEFT AND RIGHT HEART CATHETERIZATION WITH CORONARY ANGIOGRAM N/A 03/17/2013   Procedure: LEFT AND RIGHT HEART CATHETERIZATION WITH CORONARY ANGIOGRAM;  Surgeon: Burnell Blanks, MD;  Location: [facility] CATH LAB;  Service: Cardiovascular;  Laterality: N/A;  . LEFT AND RIGHT HEART CATHETERIZATION WITH CORONARY/GRAFT ANGIOGRAM N/A 09/14/2014   Procedure: LEFT AND RIGHT HEART CATHETERIZATION WITH Beatrix Fetters;  Surgeon: Burnell Blanks, MD;  Location: [facility] CATH LAB;  Service: Cardiovascular;  Laterality: N/A;  . TEE WITHOUT CARDIOVERSION N/A 03/14/2013   Procedure: TRANSESOPHAGEAL ECHOCARDIOGRAM (TEE);  Surgeon: Lelon Perla, MD;  Location: [facility] ENDOSCOPY;  Service: Cardiovascular;  Laterality: N/A;  . TEE WITHOUT CARDIOVERSION N/A 11/14/2014   Procedure: TRANSESOPHAGEAL ECHOCARDIOGRAM (TEE);  Surgeon: Lelon Perla, MD;  Location: [facility] ENDOSCOPY;  Service: Cardiovascular;  Laterality: N/A;  . TOTAL KNEE ARTHROPLASTY  09/10/2012   Procedure: TOTAL KNEE ARTHROPLASTY;  Surgeon: Mcarthur Rossetti, MD;  [facility];  Service: Orthopedics;  Laterality: Left;  . TOTAL KNEE REVISION Right 07/15/2013   Procedure: REVISION ARTHROPLASTY RIGHT KNEE;  Surgeon: Mcarthur Rossetti, MD;  Location: [facility];  Service: Orthopedics;  Laterality: Right;  . TRANSCATHETER AORTIC VALVE REPLACEMENT, TRANSFEMORAL N/A 12/12/2014   Procedure: TRANSCATHETER AORTIC VALVE REPLACEMENT, TRANSFEMORAL;  Surgeon: Burnell Blanks, MD;  Location: [place];  Service: Cardiovascular;  Laterality: N/A;  . TRIGGER FINGER RELEASE  09/10/2012   Procedure: RELEASE TRIGGER FINGER/A-1 PULLEY;  Surgeon: Mcarthur Rossetti, MD;  Location: [facility];  Service: Orthopedics;  Laterality: Right;  Right Ring Finger  . TUBAL  LIGATION      Current Outpatient Medications  Medication Sig Dispense Refill  . albuterol (PROVENTIL HFA;VENTOLIN HFA) 108 (90 Base) MCG/ACT inhaler Inhale 2 puffs into the lungs every 4 (four) hours as needed for wheezing or shortness of breath. 2 Inhaler 3  . albuterol (PROVENTIL) (2.5 MG/3ML) 0.083% nebulizer solution Take 3 mLs (2.5 mg total) by nebulization every 4 (four) hours as needed for wheezing or shortness of breath. 150 mL 0  . aspirin EC 81 MG tablet Take 1 tablet (81 mg total) by mouth daily. 90 tablet 3  . Aspirin-Acetaminophen-Caffeine (GOODY HEADACHE PO) Take 1 Package by mouth daily as needed.    Marland Kitchen atorvastatin (LIPITOR) 80 MG tablet Take 1 tablet (80 mg total) by mouth daily. 90 tablet 3  . clopidogrel (PLAVIX) 75 MG tablet Take 1 tablet (75 mg total) by mouth every morning. 90 tablet 1  . Diclofenac Sodium 3 % CREA Apply 1 Dose topically 2 (two) times daily. 1 g 1  . DULoxetine (CYMBALTA) 30 MG capsule Take 1 capsule (30 mg total) by mouth 2 (two) times daily. 60 capsule 6  .  ezetimibe (ZETIA) 10 MG tablet Take 1 tablet (10 mg total) by mouth daily. 90 tablet 3  . Fluticasone-Umeclidin-Vilant (TRELEGY ELLIPTA) 100-62.5-25 MCG/INH AEPB Inhale 1 puff into the lungs daily. 1 each 11  . furosemide (LASIX) 40 MG tablet Take 1 tablet (40 mg total) by mouth 2 (two) times daily. As needed for swelling 60 tablet 3  . gabapentin (NEURONTIN) 300 MG capsule Take 3 capsules (900 mg total) by mouth at bedtime. 90 capsule 1  . glucose blood (WAVESENSE PRESTO) test strip Use as instructed 100 each 12  . ibuprofen (ADVIL,MOTRIN) 200 MG tablet Take 400 mg by mouth every 6 (six) hours as needed for moderate pain.    Marland Kitchen losartan (COZAAR) 100 MG tablet Take 1 tablet (100 mg total) by mouth daily. 90 tablet 1  . meloxicam (MOBIC) 15 MG tablet Take 1 tablet (15 mg total) by mouth daily. 30 tablet 0  . metoprolol tartrate (LOPRESSOR) 25 MG tablet Take 1 tablet (25 mg total) by mouth 2 (two) times  daily. 180 tablet 1  . nitroGLYCERIN (NITROSTAT) 0.4 MG SL tablet Place 1 tablet (0.4 mg total) under the tongue every 5 (five) minutes as needed for chest pain. 25 tablet 6  . OXYGEN-HELIUM IN Inhale 3 L into the lungs daily.     . potassium chloride SA (K-DUR,KLOR-CON) 20 MEQ tablet Take 2 tablets (40 mEq total) by mouth at bedtime. DO NOT TAKE IF YOU DO NOT TAKE FUROSEMIDE (LASIX) 60 tablet 0   No current facility-administered medications for this visit.     No Known Allergies  Social History   Socioeconomic History  . Marital status: Married    Spouse name: Not on file  . Number of children: 2  . Years of education: Not on file  . Highest education level: Not on file  Occupational History  . Occupation: Disabled  Social Needs  . Financial resource strain: Not on file  . Food insecurity:    Worry: Not on file    Inability: Not on file  . Transportation needs:    Medical: Not on file    Non-medical: Not on file  Tobacco Use  . Smoking status: Former Smoker    Packs/day: 1.50    Years: 30.00    Pack years: 45.00    Types: Cigarettes    Last attempt to quit: 12/29/2000    Years since quitting: 17.2  . Smokeless tobacco: Never Used  Substance and Sexual Activity  . Alcohol use: Yes    Alcohol/week: 0.6 oz    Types: 1 Cans of beer per week    Comment: rarely  . Drug use: No  . Sexual activity: Never    Birth control/protection: Surgical  Lifestyle  . Physical activity:    Days per week: Not on file    Minutes per session: Not on file  . Stress: Not on file  Relationships  . Social connections:    Talks on phone: Not on file    Gets together: Not on file    Attends religious service: Not on file    Active member of club or organization: Not on file    Attends meetings of clubs or organizations: Not on file    Relationship status: Not on file  . Intimate partner violence:    Fear of current or ex partner: Not on file    Emotionally abused: Not on file     Physically abused: Not on file    Forced sexual activity: Not  on file  Other Topics Concern  . Not on file  Social History Narrative  . Not on file    Family History  Problem Relation Age of Onset  . Breast cancer Mother   . Emphysema Mother        smoked  . Heart disease Mother   . COPD Father        smoked  . Asthma Father   . Heart disease Father   . Cancer Brother        Sinus    Review of Systems:  As stated in the HPI and otherwise negative.   BP 120/60   Pulse 65   Ht 5' 2.5" (1.588 m)   Wt (!) 338 lb 6.4 oz (153.5 kg)   LMP 02/03/2012   SpO2 95%   BMI 60.91 kg/m   Physical Examination:  General: Well developed, well nourished, NAD  HEENT: OP clear, mucus membranes moist  SKIN: warm, dry. No rashes. Neuro: No focal deficits  Musculoskeletal: Muscle strength 5/5 all ext  Psychiatric: Mood and affect normal  Neck: No JVD, no carotid bruits, no thyromegaly, no lymphadenopathy.  Lungs:Clear bilaterally, no wheezes, rhonci, crackles Cardiovascular: Regular rate and rhythm. No murmurs, gallops or rubs. Abdomen:Soft. Bowel sounds present. Non-tender.  Extremities: No lower extremity edema. Pulses are 2 + in the bilateral DP/PT.  Cardiac cath 09/14/14: Left main: No obstructive disease.  Left Anterior Descending Artery: Large caliber vessel that courses to the apex. There is a moderate caliber diagonal branch. No obstructive disease noted.  Circumflex Artery: Large caliber vessel with two small obtuse marginal branches and a moderate caliber posterolateral branch. No obstructive disease noted.  Right Coronary Artery: Large dominant vessel with 70-80% ostial stenosis. 40% proximal stenosis. Distal stented segment is patent with no restenosis.  Left Ventricular Angiogram: Deferred.    Echo December 2016: Left ventricle: The cavity size was normal. There was mild   concentric hypertrophy. Systolic function was vigorous. The   estimated ejection fraction was in the  range of 65% to 70%. Wall   motion was normal; there were no regional wall motion   abnormalities. Doppler parameters are consistent with abnormal   left ventricular relaxation (grade 1 diastolic dysfunction).   Doppler parameters are consistent with elevated ventricular   end-diastolic filling pressure. - Aortic valve: A bioprosthetic Edward-SAPIEN valve is seated well   in the artic position, leaflets are poorly visualized.   There is trivial aortic regurgitation. Transaortic gradients are   elevated with mean 30 mmHg, peak gradient 42 mmHg. Trileaflet;   normal thickness leaflets. - Aortic root: The aortic root was normal in size. - Mitral valve: Calcified annulus. Mildly thickened leaflets . - Left atrium: The atrium was mildly dilated. - Right atrium: The atrium was normal in size. - Tricuspid valve: There was no regurgitation. - Pulmonary arteries: Systolic pressure was within the normal   range. - Inferior vena cava: The vessel was normal in size. - Pericardium, extracardiac: There was no pericardial effusion.  Impressions:  - A bioprosthetic Edward-SAPIEN valve is seated well in the artic   position, leaflets are poorly visualized.   There is trivial aortic regurgitation. Transaortic gradients are   elevated with mean 30 mmHg, peak gradient 42 mmHg.   The gradients are slightly elevated when compared to the prior   study from 02/07/2015 (mean 25 mmHg, peak 38 mmHg).  EKG:  EKG is not ordered today. The ekg ordered today demonstrates    Recent Labs: 01/25/2018:  BUN 8; Creatinine, Ser 0.72; Hemoglobin 11.7; Platelets 233; Potassium 4.5; Sodium 138   Lipid Panel    Component Value Date/Time   CHOL 213 (H) 12/28/2015 1052   TRIG 100 12/28/2015 1052   HDL 58 12/28/2015 1052   CHOLHDL 3.7 12/28/2015 1052   VLDL 20 12/28/2015 1052   LDLCALC 135 (H) 12/28/2015 1052   LDLDIRECT 162.0 10/17/2013 0905     Wt Readings from Last 3 Encounters:  03/22/18 (!) 338 lb 6.4 oz  (153.5 kg)  03/12/18 (!) 337 lb 6.4 oz (153 kg)  02/09/18 (!) 337 lb 3.2 oz (153 kg)     Other studies Reviewed: Additional studies/ records that were reviewed today include: . Review of the above records demonstrates:    Assessment and Plan:   1. Severe Aortic valve stenosis: She is s/p TAVR on 12/12/14. Her valve was working well by echo in December 2016. Will continue ASA and Plavix given her CAD and valve replacement. She is aware that she should use SBE prophylaxis prior to select procedures including dental visits.    2. CAD without angina: Cardiac cath September 2015 with moderate ostial RCA stenosis and no disease in the LAD or Circumflex. No chest pain. Will continue ASA, Plavix, statin, beta blocker and ARB.    3. Dyspnea: Multi-factorial due to obesity, deconditioning, CAD, asthma and chronic thromboembolic disease.   4. Hyperlipidemia: No recent lipids. I will arrange fasting lipids and LFTS this week. Continue statin.   5. Morbid obesity: Weight loss is recommended.   Current medicines are reviewed at length with the patient today.  The patient does not have concerns regarding medicines.  The following changes have been made:  no change  Labs/ tests ordered today include:   Orders Placed This Encounter  Procedures  . Lipid Profile  . Hepatic function panel    Disposition:   FU with me in 12  months  Signed, Lauree Chandler, MD 03/22/2018 11:10 AM    [facility] [place], [place], [place]  [address] Phone: [phone]; Fax: [phone]

## 2018-03-24 ENCOUNTER — Other Ambulatory Visit: Payer: No Typology Code available for payment source

## 2018-03-25 ENCOUNTER — Other Ambulatory Visit: Payer: No Typology Code available for payment source

## 2018-03-25 DIAGNOSIS — E7800 Pure hypercholesterolemia, unspecified: Secondary | ICD-10-CM

## 2018-03-25 LAB — HEPATIC FUNCTION PANEL
ALK PHOS: 75 IU/L (ref 39–117)
ALT: 17 IU/L (ref 0–32)
AST: 14 IU/L (ref 0–40)
Albumin: 3.8 g/dL (ref 3.5–5.5)
BILIRUBIN TOTAL: 0.6 mg/dL (ref 0.0–1.2)
Bilirubin, Direct: 0.17 mg/dL (ref 0.00–0.40)
TOTAL PROTEIN: 6.4 g/dL (ref 6.0–8.5)

## 2018-03-25 LAB — LIPID PANEL
CHOLESTEROL TOTAL: 175 mg/dL (ref 100–199)
Chol/HDL Ratio: 3.6 ratio (ref 0.0–4.4)
HDL: 49 mg/dL (ref >39)
LDL CALC: 110 mg/dL — AB (ref 0–99)
Triglycerides: 80 mg/dL (ref 0–149)
VLDL Cholesterol Cal: 16 mg/dL (ref 5–40)

## 2018-03-29 ENCOUNTER — Other Ambulatory Visit: Payer: No Typology Code available for payment source

## 2018-03-29 DIAGNOSIS — Z1211 Encounter for screening for malignant neoplasm of colon: Secondary | ICD-10-CM

## 2018-03-29 NOTE — Progress Notes (Signed)
IFOBT received in the Clinic Lab 03-29-2018, No collection date on specimen. I called Ms. Ervine at 0930 on 03-29-2018 to verify collection date. Per Ms. Cavanah specimen collected 03-15-18. Specimen sent to [facility] with note attached "TIME SENSITIVE".  Process immediately.  Maryan Rued, PBT 03-29-2018 10:01

## 2018-03-30 LAB — FECAL OCCULT BLOOD, IMMUNOCHEMICAL: FECAL OCCULT BLD: NEGATIVE

## 2018-04-08 ENCOUNTER — Encounter: Payer: Self-pay | Admitting: Obstetrics and Gynecology

## 2018-04-08 ENCOUNTER — Ambulatory Visit (INDEPENDENT_AMBULATORY_CARE_PROVIDER_SITE_OTHER): Payer: No Typology Code available for payment source | Admitting: Obstetrics and Gynecology

## 2018-04-08 ENCOUNTER — Other Ambulatory Visit (HOSPITAL_COMMUNITY)
Admission: RE | Admit: 2018-04-08 | Discharge: 2018-04-08 | Disposition: A | Discharge status: Home / Self Care | Payer: No Typology Code available for payment source | Source: Ambulatory Visit | Attending: Obstetrics and Gynecology | Admitting: Obstetrics and Gynecology

## 2018-04-08 VITALS — BP 133/73 | HR 90 | Ht 60.0 in | Wt 335.5 lb

## 2018-04-08 DIAGNOSIS — N8500 Endometrial hyperplasia, unspecified: Secondary | ICD-10-CM | POA: Insufficient documentation

## 2018-04-08 NOTE — Procedures (Signed)
Endometrial Biopsy Procedure Note  Pre-operative Diagnosis: H/o of CAH  Post-operative Diagnosis: same   Procedure Details The risks (including infection, bleeding, pain, and uterine perforation) and benefits of the procedure were explained to the patient and Written informed consent was obtained.  The patient was placed in the dorsal lithotomy position.  Bimanual exam showed the uterus to be in the neutral position.  A Graves' speculum inserted in the vagina, and the cervix was visualized with moderate difficulty. Large graves speculum used and single toothed tenaculum used to bring it into view better. The cervix was then prepped with povidone iodine, and a sharp tenaculum was applied to the anterior lip of the cervix for stabilization.  A pipelle was inserted into the uterine cavity and sounded the uterus to a depth of 9cm.  A Small amount of tissue was collected after 2 passes. The sample was sent for pathologic examination.  Condition: Stable  Complications: None  Plan: The patient was advised to call for any fever or for prolonged or severe pain or bleeding. She was advised to avoid vaginal intercourse for 48 hours or until the bleeding has completely stopped.  Durene Romans MD Attending Center for [employer] [facility] farm manager)

## 2018-04-09 NOTE — Progress Notes (Signed)
GYN Note Surveillance EMBx done today. Patient states her Mirena IUD fell out about 60m after placement, which was placed in December 2017; she states she didn't take any pills after this, hasn't had any bleeding and that she called the office where she had the IUD placed to let them know it fell out but didn't hear anything back.  Durene Romans MD Attending Center for [employer] (Faculty Practice) 04/08/2018

## 2018-04-13 ENCOUNTER — Encounter: Payer: Self-pay | Admitting: *Deleted

## 2018-04-19 ENCOUNTER — Other Ambulatory Visit: Payer: Self-pay | Admitting: Internal Medicine

## 2018-04-19 DIAGNOSIS — J441 Chronic obstructive pulmonary disease with (acute) exacerbation: Secondary | ICD-10-CM

## 2018-04-22 ENCOUNTER — Other Ambulatory Visit: Payer: Self-pay | Admitting: Internal Medicine

## 2018-04-22 DIAGNOSIS — I5032 Chronic diastolic (congestive) heart failure: Secondary | ICD-10-CM

## 2018-04-23 ENCOUNTER — Encounter (INDEPENDENT_AMBULATORY_CARE_PROVIDER_SITE_OTHER): Payer: Self-pay

## 2018-04-23 ENCOUNTER — Ambulatory Visit (INDEPENDENT_AMBULATORY_CARE_PROVIDER_SITE_OTHER): Payer: No Typology Code available for payment source | Admitting: Pharmacist

## 2018-04-23 ENCOUNTER — Encounter: Payer: Self-pay | Admitting: Pharmacist

## 2018-04-23 DIAGNOSIS — E785 Hyperlipidemia, unspecified: Secondary | ICD-10-CM

## 2018-04-23 NOTE — Progress Notes (Signed)
Patient ID: [name]                 DOB: Apr 29, 1961                    MRN: [id number]     HPI: [name] is a 57 y.o. female patient of Dr. Angelena Form that presents today for lipid evaluation.  PMH includes severe aortic stenosis now s/p TAVR,  CAD, OSA, HTN, HLD, obesity, GERD, asthma, COPD, former tobacco abuse, depression. She had a cardiac cath in February 2010 and had a drug eluting stent (2.14mm Xience) placed in the distal RCA. She is currently taking atorvastatin 80mg  daily and ezetimibe 10mg  daily.   She presents today for discussion of cholesterol. She reports that she is tolerating her current medications with no issues.   Risk Factors: CAD s/p stent LDL Goal: <70  Current Medications: atorvastatin 80mg  daily, zetia 10mg  daily  Intolerances: none  Diet: Most meals prepared from home. She eats hamburger, steak, chicken, some pork, some bacon, and some sausage. She eats shrimp and fish occasionally. She dose love vegetables steamed. She does cook with butter and never oils due to her tastes. She is now drinking more water. She occasionally will have a regular soda.   Exercise: She is limited due to back. She has been trying to do seated exercises.   Family History: mother - breast cancer, emphysema, heart disease; father - COPD, asthma, heart disease; brother - cancer  Social History: former smoker, 1 beer per week  Labs: 03/25/18:  TC 175, TG 80, HDL 49, LDL 110 (atorvastatin 80mg , zetia 10mg  daily)  Past Medical History:  Diagnosis Date  . Anemia   . Aortic valve stenosis, severe   . 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 Asthma   . Blood transfusion    [facility]  . CAD (coronary artery disease)    Cath 2010 with DES x 1 RCA-- PT'S CARDIOLOGIST IS DR. Beck[place]  . Chronic diastolic congestive heart failure ([place])   . COPD (chronic obstructive  pulmonary disease) ([place])   . Depression   . Diabetes mellitus DIAGNOSED IN2010   ORAL MEDS  . Eczema    on back  . Hyperlipidemia   . Hypertension   . Kidney stones   . Morbid obesity with body mass index of 60.0-69.9 in adult [facility])   . Myocardial infarction ([place])    PT THINKS SHE WAS DX WITH MI AT THE TIME OF HEART STENTING  . Pulmonary embolism ([place]) 02/08/12   S/P RT TOTAL KNEE ON 02/03/12--ON 02/08/12--DEVELOPED ACUTE SOB AND CHEST PAIN--AND DIAGNOSED WITH  PULMONARY EMBOLUS AND PNEUMONIA  . Restless leg syndrome   . Sleep apnea    uses 3 liters O2 at night   . Uterine fibroid    NO PROBLEMS AT PRESENT FROM THE FIBROIDS-STATES SHE IS POST MENOPAUSAL-LAST MENSES 2010 EXCEPT FOR EPISODE THIS YR OF BLEEDING RELATED TO FIBROIDS.  Marland Kitchen Weakness    BOTH HANDS - S/P BILATERAL CARPAL TUNNEL RELEASE--BUT STILL HAS WEAKNESS--OFTEN DROPS THINGS    Current Outpatient Medications on File Prior to Visit  Medication Sig Dispense Refill  . albuterol (PROVENTIL HFA;VENTOLIN HFA) 108 (90 Base) MCG/ACT inhaler Inhale 2 puffs into the lungs every 4 (four) hours as needed for wheezing or shortness of breath. 2 Inhaler 3  .  albuterol (PROVENTIL) (2.5 MG/3ML) 0.083% nebulizer solution Take 3 mLs (2.5 mg total) by nebulization every 4 (four) hours as needed for wheezing or shortness of breath. 150 mL 0  . aspirin EC 81 MG tablet Take 1 tablet (81 mg total) by mouth daily. 90 tablet 3  . Aspirin-Acetaminophen-Caffeine (GOODY HEADACHE PO) Take 1 Package by mouth daily as needed.    Marland Kitchen atorvastatin (LIPITOR) 80 MG tablet Take 1 tablet (80 mg total) by mouth daily. 90 tablet 3  . clopidogrel (PLAVIX) 75 MG tablet Take 1 tablet (75 mg total) by mouth every morning. 90 tablet 1  . Diclofenac Sodium 3 % CREA Apply 1 Dose topically 2 (two) times daily. 1 g 1  . DULoxetine (CYMBALTA) 30 MG capsule Take 1 capsule (30 mg total) by mouth 2 (two) times daily. 60 capsule 6  . ezetimibe (ZETIA) 10 MG tablet Take 1 tablet (10  mg total) by mouth daily. 90 tablet 3  . Fluticasone-Umeclidin-Vilant (TRELEGY ELLIPTA) 100-62.5-25 MCG/INH AEPB Inhale 1 puff into the lungs daily. 1 each 11  . furosemide (LASIX) 40 MG tablet Take 1 tablet (40 mg total) by mouth 2 (two) times daily. As needed for swelling 60 tablet 3  . gabapentin (NEURONTIN) 300 MG capsule Take 3 capsules (900 mg total) by mouth at bedtime. 90 capsule 1  . glucose blood (WAVESENSE PRESTO) test strip Use as instructed 100 each 12  . ibuprofen (ADVIL,MOTRIN) 200 MG tablet Take 400 mg by mouth every 6 (six) hours as needed for moderate pain.    Marland Kitchen losartan (COZAAR) 100 MG tablet Take 1 tablet (100 mg total) by mouth daily. 90 tablet 1  . meloxicam (MOBIC) 15 MG tablet Take 1 tablet (15 mg total) by mouth daily. 30 tablet 0  . metoprolol tartrate (LOPRESSOR) 25 MG tablet Take 1 tablet (25 mg total) by mouth 2 (two) times daily. 180 tablet 1  . nitroGLYCERIN (NITROSTAT) 0.4 MG SL tablet Place 1 tablet (0.4 mg total) under the tongue every 5 (five) minutes as needed for chest pain. 25 tablet 6  . OXYGEN-HELIUM IN Inhale 3 L into the lungs daily.     . potassium chloride SA (K-DUR,KLOR-CON) 20 MEQ tablet Take 2 tablets (40 mEq total) by mouth at bedtime. DO NOT TAKE IF YOU DO NOT TAKE FUROSEMIDE (LASIX) 60 tablet 0   No current facility-administered medications on file prior to visit.     No Known Allergies  Assessment/Plan: Hyperlipidemia: LDL not at goal on max dose atorvastatin and ezetimibe therapy. Discussed lifestyle modifications to assist with cholesterol control. Have left message to see if [facility] card will cover [id number] therapy. I do not believe that they carry or cover this medication and thus have had her fill out paperwork for PASS program. We will plan to start Praluent 75mg  every 14 days. Risk/Benefit and injection technique discussed today. Will schedule lipids and hepatic panel once medication started.    Thank you,  Lelan Pons. Patterson Hammersmith, [facility]  04/23/2018 7:37 AM

## 2018-04-23 NOTE — Patient Instructions (Addendum)
We will send for coverage of Praluent through the patient assistance foundation if not covered by current insurance. Please call our office [phone]) if you are contacted by the [employer] (patient assistance company) about the medication.   Cholesterol Cholesterol is a fat. Your body needs a small amount of cholesterol. Cholesterol (plaque) may build up in your blood vessels (arteries). That makes you more likely to have a heart attack or stroke. You cannot feel your cholesterol level. Having a blood test is the only way to find out if your level is high. Keep your test results. Work with your doctor to keep your cholesterol at a good level. What do the results mean?  Total cholesterol is how much cholesterol is in your blood.  LDL is bad cholesterol. This is the type that can build up. Try to have low LDL.  HDL is good cholesterol. It cleans your blood vessels and carries LDL away. Try to have high HDL.  Triglycerides are fat that the body can store or burn for energy. What are good levels of cholesterol?  Total cholesterol below 200.  LDL below 100 is good for people who have health risks. LDL below 70 is good for people who have very high risks.  HDL above 40 is good. It is best to have HDL of 60 or higher.  Triglycerides below 150. How can I lower my cholesterol? Diet Follow your diet program as told by your doctor.  Choose fish, white meat chicken, or [place] that is roasted or baked. Try not to eat red meat, fried foods, sausage, or lunch meats.  Eat lots of fresh fruits and vegetables.  Choose whole grains, beans, pasta, potatoes, and cereals.  Choose olive oil, corn oil, or canola oil. Only use small amounts.  Try not to eat butter, mayonnaise, shortening, or palm kernel oils.  Try not to eat foods with trans fats.  Choose low-fat or nonfat dairy foods. ? Drink skim or nonfat milk. ? Eat low-fat or nonfat yogurt and cheeses. ? Try not to drink whole milk or  cream. ? Try not to eat ice cream, egg yolks, or full-fat cheeses.  Healthy desserts include angel food cake, ginger snaps, animal crackers, hard candy, popsicles, and low-fat or nonfat frozen yogurt. Try not to eat pastries, cakes, pies, and cookies.  Exercise Follow your exercise program as told by your doctor.  Be more active. Try gardening, walking, and taking the stairs.  Ask your doctor about ways that you can be more active.  Medicine  Take over-the-counter and prescription medicines only as told by your doctor. This information is not intended to replace advice given to you by your health care provider. Make sure you discuss any questions you have with your health care provider. Document Released: 03/13/2009 Document Revised: 07/16/2016 Document Reviewed: 06/26/2016 Elsevier Interactive Patient Education  [employer].

## 2018-04-26 ENCOUNTER — Other Ambulatory Visit: Payer: Self-pay | Admitting: Internal Medicine

## 2018-04-26 ENCOUNTER — Other Ambulatory Visit: Payer: Self-pay | Admitting: Obstetrics and Gynecology

## 2018-04-26 DIAGNOSIS — Z1231 Encounter for screening mammogram for malignant neoplasm of breast: Secondary | ICD-10-CM

## 2018-05-03 ENCOUNTER — Telehealth: Payer: Self-pay | Admitting: Obstetrics and Gynecology

## 2018-05-03 MED ORDER — MEGESTROL ACETATE 40 MG PO TABS: 80.0000 mg | ORAL_TABLET | Freq: Two times a day (BID) | ORAL | 11 refills | Status: DC

## 2018-05-03 NOTE — Telephone Encounter (Signed)
GYN Telephone Note Patient called at [phone] and d/w pt re: results and Dr. Serita Grit recommendation. She tolerated the megace fine in the past and is willing to take it again now. Will do megace 80 bid and I told her no need for further biopsies or w/u as long as she remains asymptomatic. Pt understanding and will call us with any issues.  Durene Romans MD Attending Center for [employer] [facility] (Faculty Practice) 05/03/2018 Time: 1018am

## 2018-05-03 NOTE — Telephone Encounter (Signed)
-----   Message from Everitt Amber, MD sent at 04/13/2018  5:41 PM EDT ----- Regarding: RE: CAH follow up Dear Eduard Clos, No need to repeat biopsies unless she begins bleeding abnormally. No need for screening ultrasounds. It is a good idea for her to be on progesterone given the proliferative findings on path. But only if she tolerates it.  Thanks, Terrence Dupont ----- Message ----- From: Aletha Halim, MD Sent: 04/12/2018   2:00 PM To: Everitt Amber, MD Subject: CAH follow up                                  Hey, Dr. Denman George. Do you think this patient needs any additional work up such as an ultrasound or maybe even a D&C and would you continue q22m endometrial biopsies? Also, do you think she should be on progestin therapy moving forward? She says that the IUD that was placed in late 2017 fell out likely sometime in early 2018 and she hasn't been on any progestin therapy since then; her follow up hasn't been the greatest.   Thanks so much for your help!   ----- Message ----- From: Interface, [facility] Sent: 04/12/2018  12:26 PM To: Aletha Halim, MD

## 2018-05-04 ENCOUNTER — Telehealth: Payer: Self-pay | Admitting: Pharmacist

## 2018-05-04 NOTE — Telephone Encounter (Signed)
Patient assistance for Praluent has been approved through [employer] program. Pt is aware and will call clinic when she receives her first shipment so we can schedule follow up lab work.

## 2018-05-27 ENCOUNTER — Ambulatory Visit
Admission: RE | Admit: 2018-05-27 | Discharge: 2018-05-27 | Disposition: A | Discharge status: Home / Self Care | Payer: No Typology Code available for payment source | Source: Ambulatory Visit | Attending: Obstetrics and Gynecology | Admitting: Obstetrics and Gynecology

## 2018-05-27 ENCOUNTER — Ambulatory Visit (HOSPITAL_COMMUNITY)
Admission: RE | Admit: 2018-05-27 | Discharge: 2018-05-27 | Disposition: A | Discharge status: Home / Self Care | Payer: No Typology Code available for payment source | Source: Ambulatory Visit | Attending: Obstetrics and Gynecology | Admitting: Obstetrics and Gynecology

## 2018-05-27 ENCOUNTER — Encounter (HOSPITAL_COMMUNITY): Payer: Self-pay

## 2018-05-27 VITALS — BP 126/82 | Ht 60.0 in | Wt 343.6 lb

## 2018-05-27 DIAGNOSIS — Z1231 Encounter for screening mammogram for malignant neoplasm of breast: Secondary | ICD-10-CM

## 2018-05-27 DIAGNOSIS — Z1239 Encounter for other screening for malignant neoplasm of breast: Secondary | ICD-10-CM

## 2018-05-27 IMAGING — MG DIGITAL SCREENING BILATERAL MAMMOGRAM WITH TOMO AND CAD
6 of 12 series · 6 of 36 positions shown · non-contrast
Comparison: None.

CLINICAL DATA: Screening.

EXAM:
DIGITAL SCREENING BILATERAL MAMMOGRAM WITH TOMO AND CAD

[L CC synth-2D (1 of 2)]
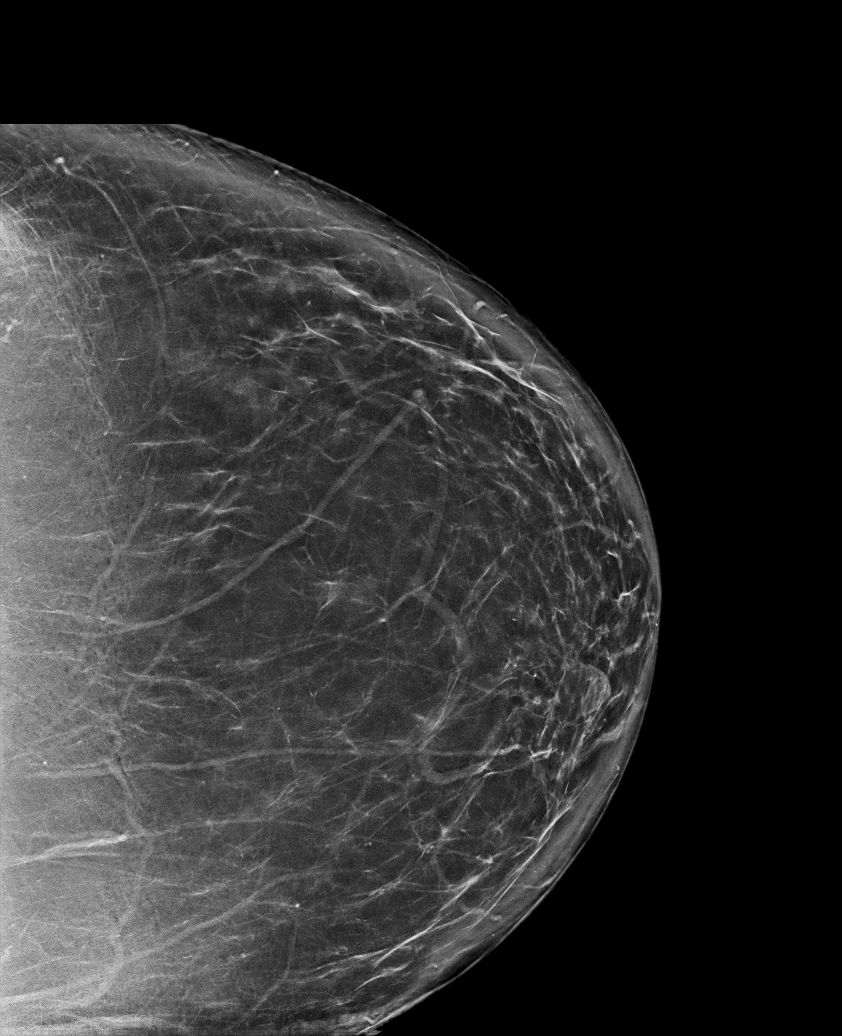

[R CC synth-2D (1 of 2)]
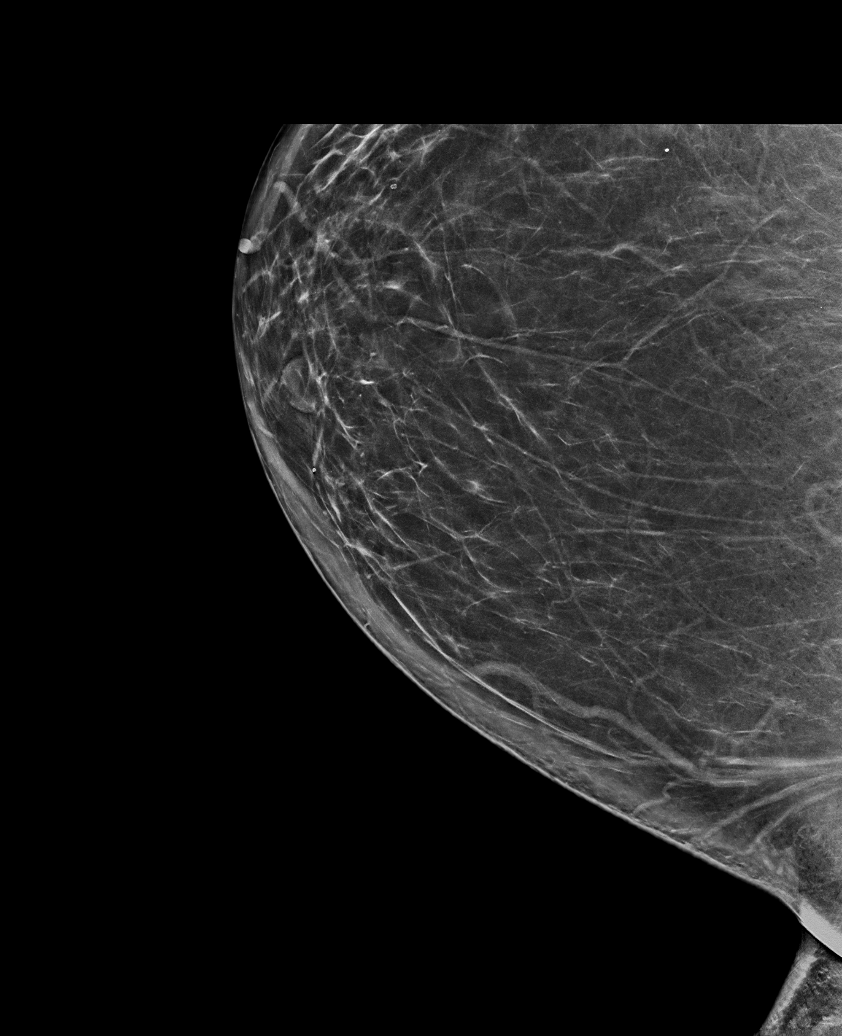

[L MLO synth-2D]
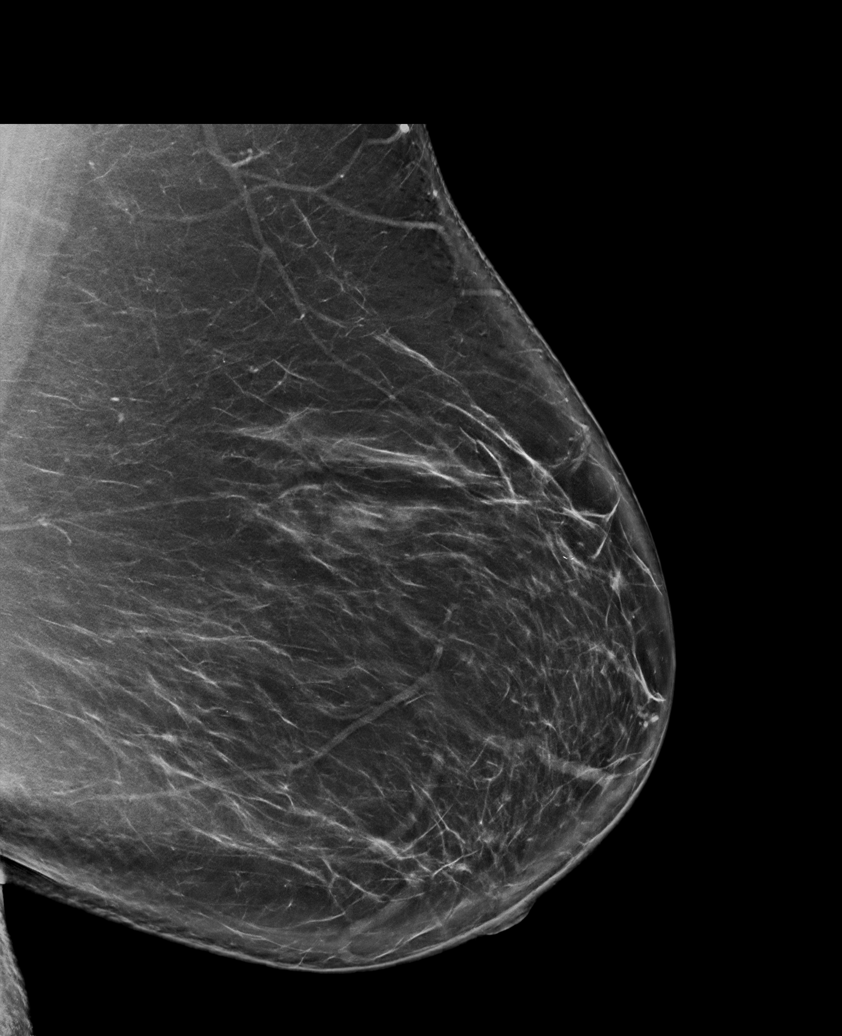

[L CC synth-2D (2 of 2)]
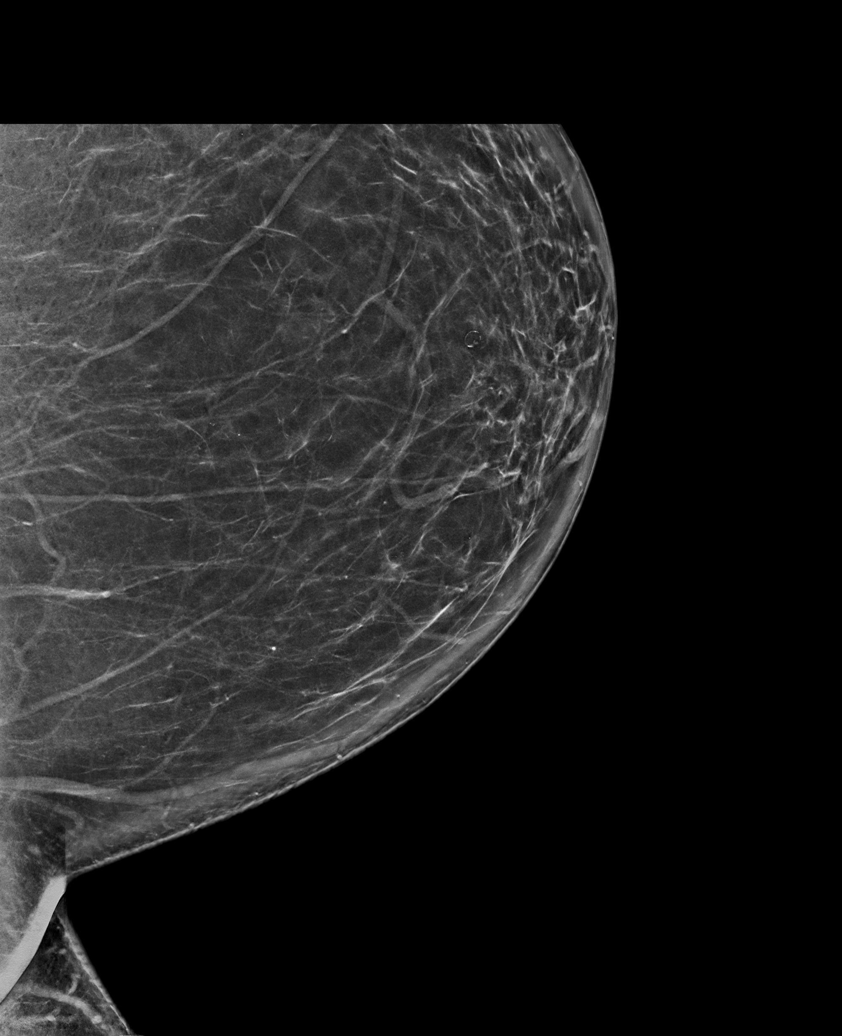

[R CC synth-2D (2 of 2)]
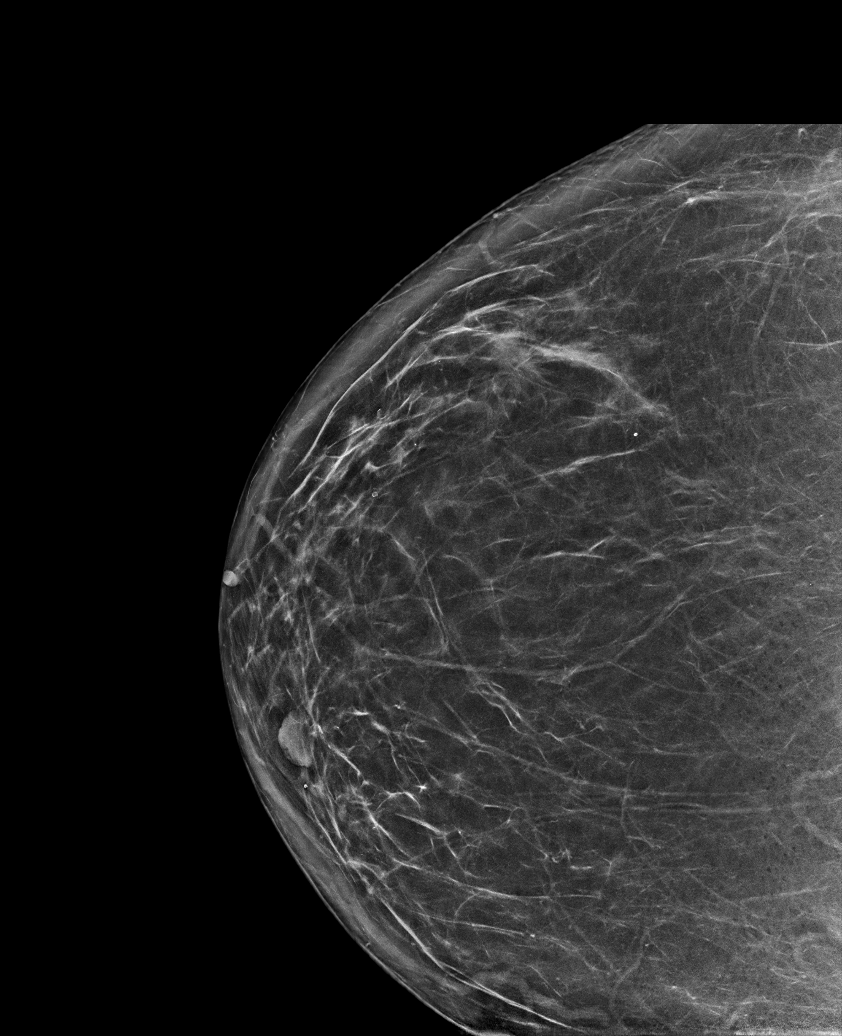

[R MLO synth-2D]
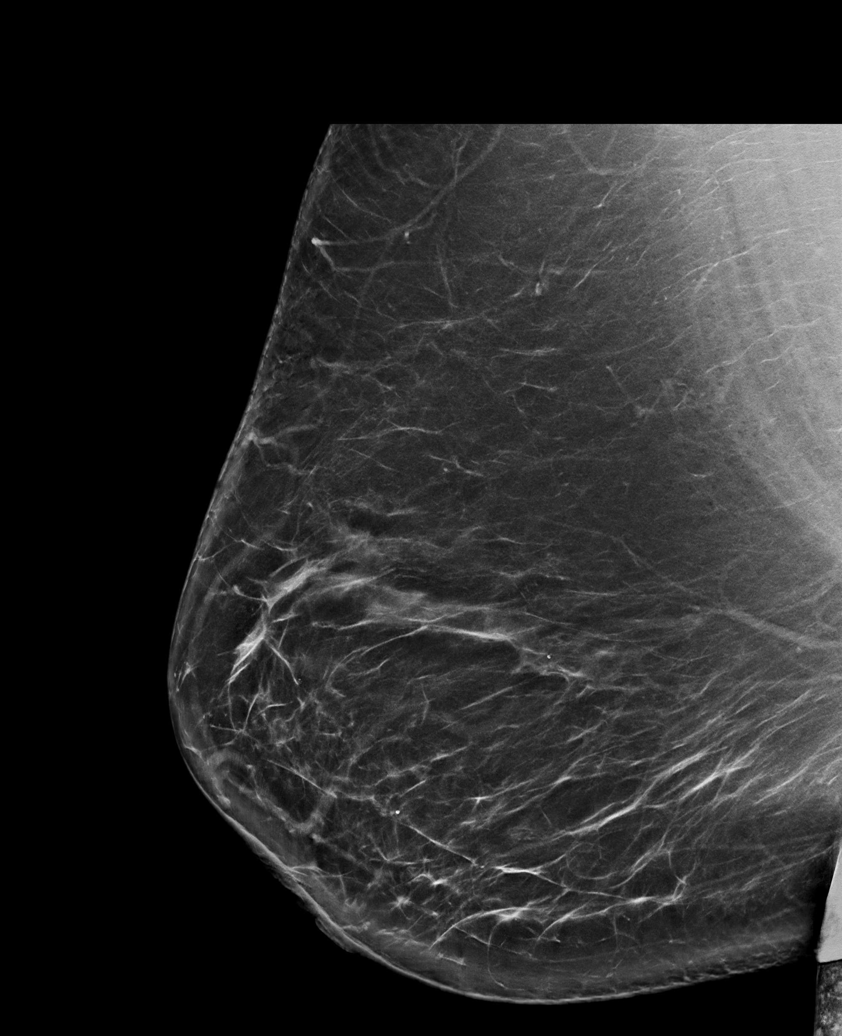

[6 of 36 positions shown; findings below may reference images not displayed]

ACR Breast Density Category b: There are scattered areas of
fibroglandular density.
FINDINGS: In the right breast, possible distortion warrants further
evaluation. In the left breast, no findings suspicious for
malignancy. Images were processed with CAD.
IMPRESSION: Further evaluation is suggested for possible distortion in the right
breast.

RECOMMENDATION:
Diagnostic mammogram and possibly ultrasound of the right breast.
(Code:[3D])

The patient will be contacted regarding the findings, and additional
imaging will be scheduled.

BI-RADS CATEGORY  0: Incomplete. Need additional imaging evaluation
and/or prior mammograms for comparison.

## 2018-05-27 NOTE — Progress Notes (Signed)
No complaints today.   Pap Smear: Pap smear not completed today. Last Pap smear was 10/20/2016 at the [facility] and normal with negative HPV. Per patient has no history of an abnormal Pap smear. Last Pap smear result is in Epic.  Physical exam: Breasts Breasts symmetrical. No skin abnormalities bilateral breasts. No nipple retraction bilateral breasts. No nipple discharge bilateral breasts. No lymphadenopathy. No lumps palpated bilateral breasts. No complaints of pain or tenderness on exam. Referred patient to the [facility] for a screening mammogram. Appointment scheduled for Thursday, May 27, 2018 at 1240.        Pelvic/Bimanual No Pap smear completed today since last Pap smear and HPV typing was 10/20/2016. Pap smear not indicated per BCCCP guidelines.   Smoking History: Patient is a former smoker that quit in 2002.  Patient Navigation: Patient education provided. Access to services provided for patient through [place] program.   Colorectal Cancer Screening: Per patient had a colonoscopy completed 5 or 6 years ago. Patient completed the FOBT stool test on 03/15/2018 that was normal. No complaints today.   Breast and Cervical Cancer Risk Assessment: Patient has a family history of her mother having breast cancer. Patient has no known genetic mutations or history of radiation treatment to the chest before age 21. Patient has no history of cervical dysplasia, immunocompromised, or DES exposure in-utero. Patient has a 5-year risk for breast cancer at 2.6% and a lifetime risk at 14.6%.

## 2018-05-27 NOTE — Patient Instructions (Signed)
Explained breast self awareness with [name]. Patient did not need a Pap smear today due to last Pap smear and HPV typing was 10/20/2016. Let her know BCCCP will cover Pap smears and HPV typing every 5 years unless has a history of abnormal Pap smears. Referred patient to the [facility] for a screening mammogram. Appointment scheduled for Thursday, May 27, 2018 at 1240. Let patient know the [facility] will follow up with her within the next couple weeks with results of mammogram by letter or phone. [name] verbalized understanding.  Devyon Keator, Arvil Chaco, RN 11:48 AM

## 2018-05-28 ENCOUNTER — Other Ambulatory Visit: Payer: Self-pay | Admitting: Obstetrics and Gynecology

## 2018-05-28 ENCOUNTER — Other Ambulatory Visit: Payer: Self-pay | Admitting: Internal Medicine

## 2018-05-28 DIAGNOSIS — R928 Other abnormal and inconclusive findings on diagnostic imaging of breast: Secondary | ICD-10-CM

## 2018-05-28 DIAGNOSIS — I5032 Chronic diastolic (congestive) heart failure: Secondary | ICD-10-CM

## 2018-05-28 DIAGNOSIS — E119 Type 2 diabetes mellitus without complications: Secondary | ICD-10-CM

## 2018-06-01 ENCOUNTER — Telehealth: Payer: Self-pay | Admitting: Pharmacist

## 2018-06-01 DIAGNOSIS — E7800 Pure hypercholesterolemia, unspecified: Secondary | ICD-10-CM

## 2018-06-01 DIAGNOSIS — E785 Hyperlipidemia, unspecified: Secondary | ICD-10-CM

## 2018-06-01 NOTE — Telephone Encounter (Signed)
Pt called to report that she just administered first injection of Praluent without incident. Scheduled her for labs after 4th dose.

## 2018-06-02 ENCOUNTER — Ambulatory Visit
Admission: RE | Admit: 2018-06-02 | Discharge: 2018-06-02 | Disposition: A | Discharge status: Home / Self Care | Payer: No Typology Code available for payment source | Source: Ambulatory Visit | Attending: Obstetrics and Gynecology | Admitting: Obstetrics and Gynecology

## 2018-06-02 ENCOUNTER — Other Ambulatory Visit: Payer: Self-pay | Admitting: Obstetrics and Gynecology

## 2018-06-02 DIAGNOSIS — R928 Other abnormal and inconclusive findings on diagnostic imaging of breast: Secondary | ICD-10-CM

## 2018-06-02 DIAGNOSIS — N6310 Unspecified lump in the right breast, unspecified quadrant: Secondary | ICD-10-CM

## 2018-06-02 IMAGING — MG DIGITAL DIAGNOSTIC UNILATERAL RIGHT MAMMOGRAM WITH TOMO AND CAD
6 series · 6 of 18 positions shown · non-contrast
Comparison: [DATE]

CLINICAL DATA: Patient returns after baseline screening study for
evaluation of a possible RIGHT breast distortion. Patient's mother
was diagnosed with breast cancer in her late 60's.

EXAM:
DIGITAL DIAGNOSTIC RIGHT MAMMOGRAM WITH CAD AND TOMO
ULTRASOUND RIGHT BREAST

[R MLO synth-2D (1 of 2)]
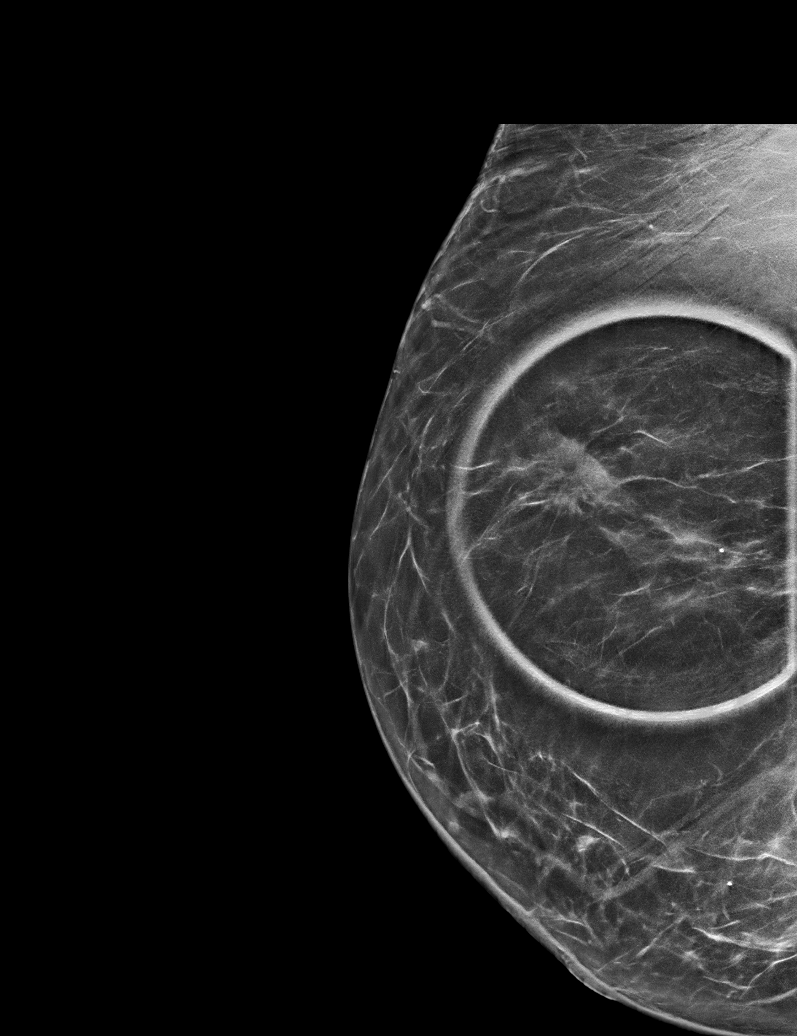

[R CC synth-2D]
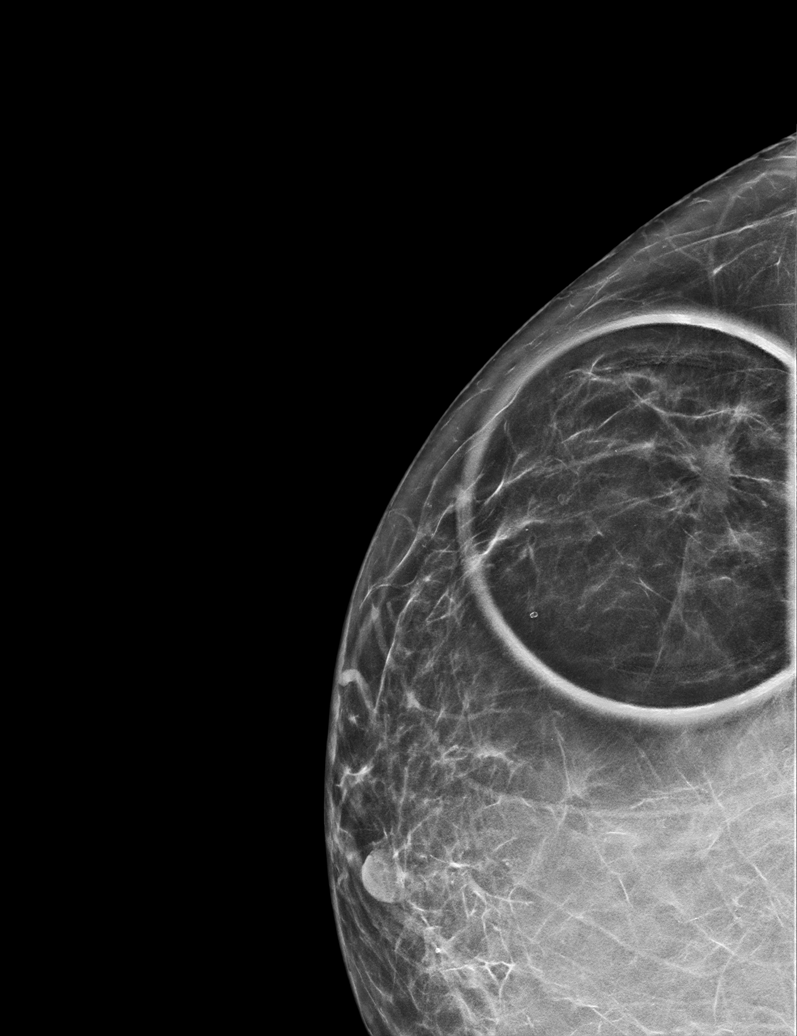

[R MLO synth-2D (2 of 2)]
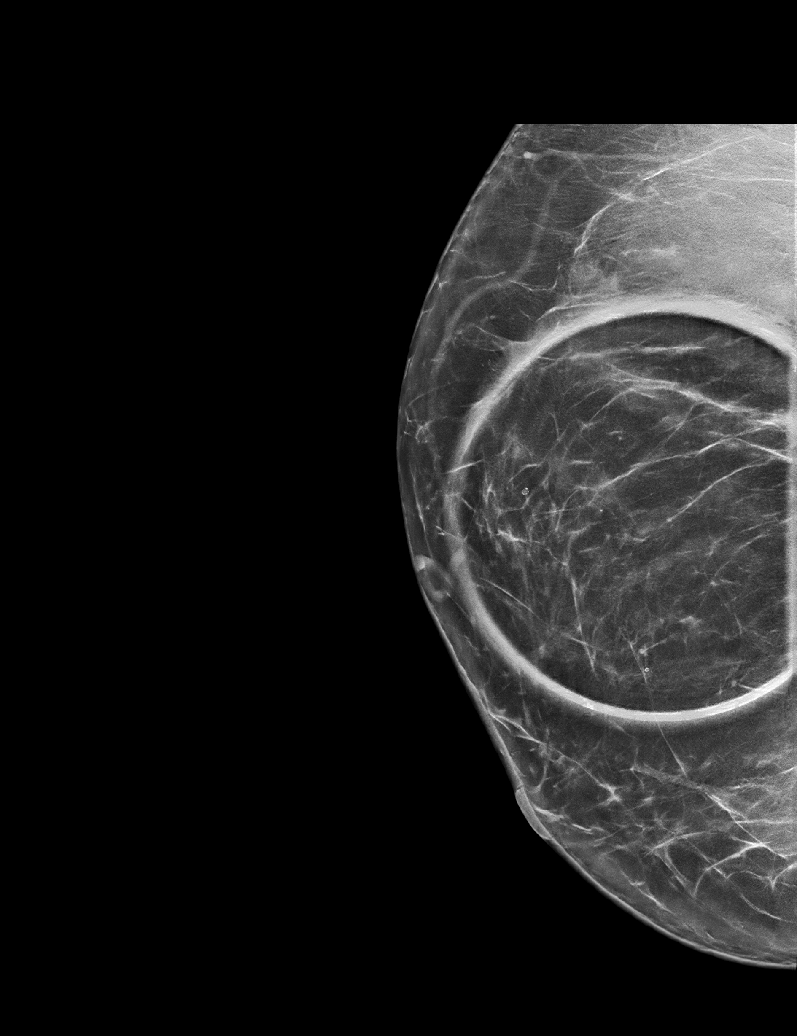

[R MLO tomo (1 of 2) · tomo slice 43/84.0]
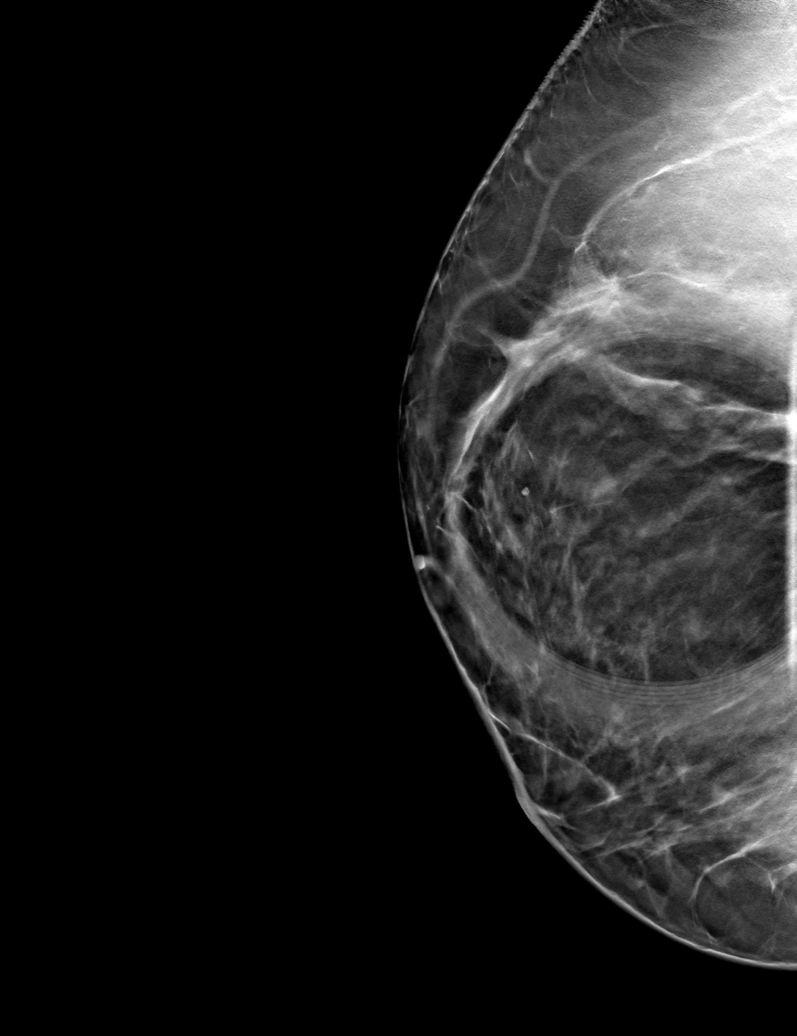

[R CC tomo · tomo slice 32/63.0]
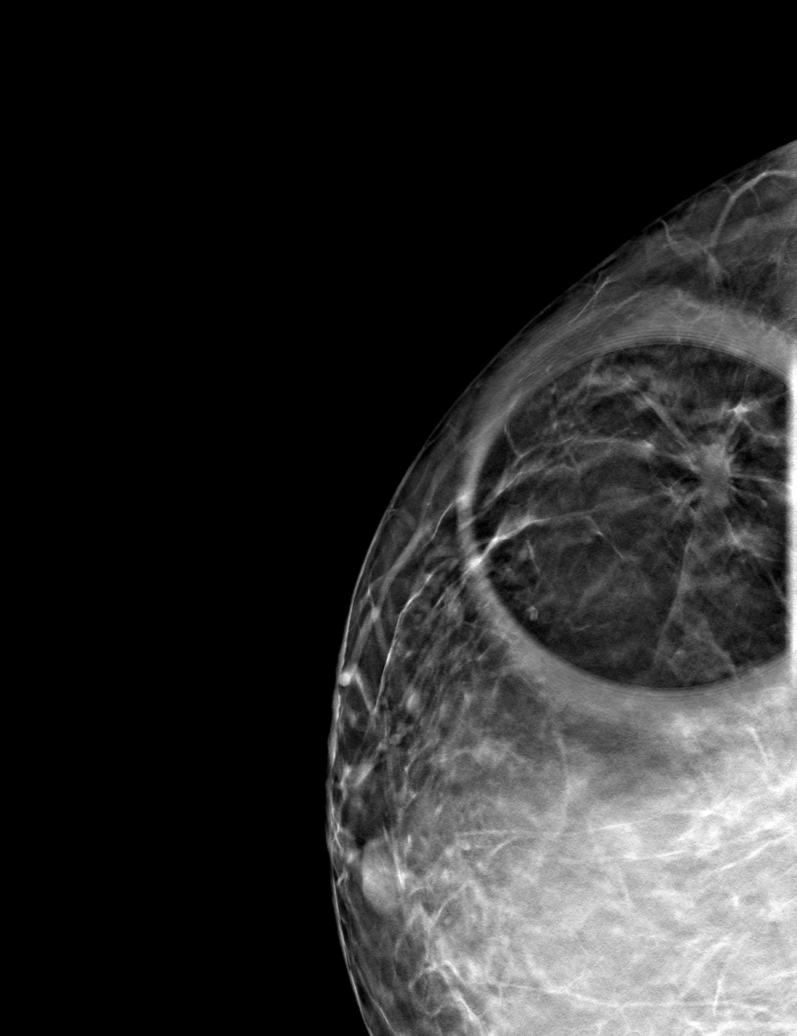

[R MLO tomo (2 of 2) · tomo slice 40/79.0]
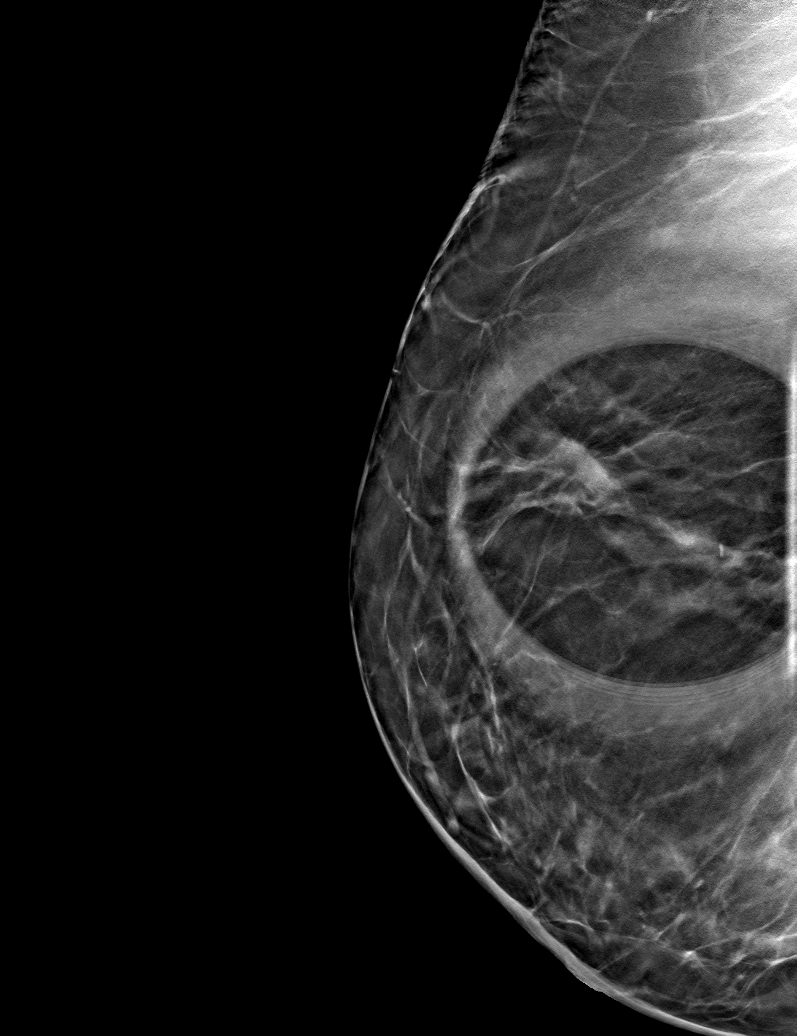

[6 of 18 positions shown; findings below may reference images not displayed]

ACR Breast Density Category b: There are scattered areas of
fibroglandular density.
FINDINGS: Additional 2-D and 3-D images are performed. Persistent area of
distortion is identified in the UPPER-OUTER QUADRANT of the RIGHT
breast and further evaluated sonographically.

Mammographic images were processed with CAD.

Targeted ultrasound is performed, showing an irregular hypoechoic
mass with irregular margins, posterior acoustic shadowing, and
internal vascularity. Masses in the 11 o'clock location 6
centimeters from the nipple, and measures 1.8 x 1.8 x
centimeters. Evaluation of the RIGHT axilla is negative for
adenopathy.
IMPRESSION: Irregular suspicious mass in the 11 o'clock location of the RIGHT
breast.

No ultrasound evidence for RIGHT axillary adenopathy.

RECOMMENDATION:
Ultrasound-guided core biopsy of RIGHT breast mass.

I have discussed the findings and recommendations with the patient
and her husband. Results were also provided in writing at the
conclusion of the visit. If applicable, a reminder letter will be
sent to the patient regarding the next appointment.

BI-RADS CATEGORY  4: Suspicious.

## 2018-06-04 ENCOUNTER — Ambulatory Visit
Admission: RE | Admit: 2018-06-04 | Discharge: 2018-06-04 | Disposition: A | Discharge status: Home / Self Care | Payer: No Typology Code available for payment source | Source: Ambulatory Visit | Attending: Obstetrics and Gynecology | Admitting: Obstetrics and Gynecology

## 2018-06-04 ENCOUNTER — Other Ambulatory Visit: Payer: Self-pay | Admitting: Obstetrics and Gynecology

## 2018-06-04 DIAGNOSIS — N6310 Unspecified lump in the right breast, unspecified quadrant: Secondary | ICD-10-CM

## 2018-06-04 HISTORY — PX: BREAST BIOPSY: SHX20

## 2018-06-08 ENCOUNTER — Encounter: Payer: Self-pay | Admitting: Internal Medicine

## 2018-06-08 ENCOUNTER — Other Ambulatory Visit: Payer: Self-pay

## 2018-06-08 ENCOUNTER — Ambulatory Visit (INDEPENDENT_AMBULATORY_CARE_PROVIDER_SITE_OTHER): Payer: Self-pay | Admitting: Internal Medicine

## 2018-06-08 VITALS — BP 140/76 | HR 81 | Temp 97.8°F | Wt 342.5 lb

## 2018-06-08 DIAGNOSIS — B370 Candidal stomatitis: Secondary | ICD-10-CM | POA: Insufficient documentation

## 2018-06-08 MED ORDER — NYSTATIN 100000 UNIT/ML MT SUSP: 5.0000 mL | Freq: Four times a day (QID) | OROMUCOSAL | 0 refills | Status: DC

## 2018-06-08 MED FILL — NYSTATIN 100,000 UNITS/ML S: 100000 | 3 days supply | Qty: 60 | Fill #0

## 2018-06-08 NOTE — Patient Instructions (Signed)
Ms. Kalbfleisch,   It was a pleasure meeting you today.   Take 5 mL of Nystatin four times daily for 7 days. Coat your mouth with the medicine. If your symptoms do not improve with this medication please call the clinic.   Leave your Trelegy inhaler by your tooth brush. After you use the inhaler, brush your teeth, mouth, roof of your mouth, and tongue thoroughly to make sure you wash your mouth of the medicine.

## 2018-06-08 NOTE — Assessment & Plan Note (Signed)
Patient presenting with a several day history of mouth tenderness and white plaques in her mouth.  The pain is not preventing her from eating or drinking.  Patient uses Trelegy daily, and does not consistently rinse her mouth after use.  She denies sore throat or pain with swallowing.  Denies fevers or chills.   On exam, small white plaques are present on the roof of the patient's mouth, there is some surrounding erythema. No bleeding noted.  Tongue small amount of white coating.  No posterior pharyngeal plaques or exudate noted.  Patient presenting with mild, early oral thrush secondary to Trelegy use and inconsistent mouth rinsing.  Plan: -Nystatin mouth rinse 5 mL 4 times daily for 7 days -If no improvement in symptoms or plaques worsen can do oral Diflucan -Return to clinic if symptoms worsen or fail to improve -Patient instructed to leave her Trelegy inhaler by her toothbrush, and after a trilogy use to brush her teeth refer her mouth and tongue thoroughly to rinse out the medication

## 2018-06-08 NOTE — Progress Notes (Signed)
   CC: oral thrush  HPI:  [name] is a 57 y.o. female with past medical history as documented below presenting with concern for oral thrush.  Please see encounter based charting for detailed description of the patient's acute and chronic medical problems.  Past Medical History:  Diagnosis Date  . Anemia   . Aortic valve stenosis, severe   . 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 Asthma   . Blood transfusion    [facility]  . CAD (coronary artery disease)    Cath 2010 with DES x 1 RCA-- PT'S CARDIOLOGIST IS DR. [place]  . Chronic diastolic congestive heart failure ([place])   . COPD (chronic obstructive pulmonary disease) ([place])   . Depression   . Diabetes mellitus DIAGNOSED IN2010   ORAL MEDS  . Eczema    on back  . Hyperlipidemia   . Hypertension   . Kidney stones   . Morbid obesity with body mass index of 60.0-69.9 in adult [facility])   . Myocardial infarction ([place])    PT THINKS SHE WAS DX WITH MI AT THE TIME OF HEART STENTING  . Pulmonary embolism ([place]) 02/08/12   S/P RT TOTAL KNEE ON 02/03/12--ON 02/08/12--DEVELOPED ACUTE SOB AND CHEST PAIN--AND DIAGNOSED WITH  PULMONARY EMBOLUS AND PNEUMONIA  . Restless leg syndrome   . Sleep apnea    uses 3 liters O2 at night   . Uterine fibroid    NO PROBLEMS AT PRESENT FROM THE FIBROIDS-STATES SHE IS POST MENOPAUSAL-LAST MENSES 2010 EXCEPT FOR EPISODE THIS YR OF BLEEDING RELATED TO FIBROIDS.  Marland Kitchen Weakness    BOTH HANDS - S/P BILATERAL CARPAL TUNNEL RELEASE--BUT STILL HAS WEAKNESS--OFTEN DROPS THINGS   Review of Systems:   Review of Systems  Constitutional: Negative for chills and fever.  Gastrointestinal: Negative for nausea and vomiting.    Physical Exam:  Vitals:   06/08/18 0937  BP: 140/76  Pulse: 81  Temp: 97.8 F (36.6 C)  TempSrc: Oral  SpO2: 98%  Weight: (!) 342 lb 8 oz (155.4 kg)   Physical Exam    Constitutional: She is oriented to person, place, and time. She appears well-developed and well-nourished. No distress.  Chronically ill, obese female  HENT:  Mouth/Throat: Mucous membranes are normal. Oral lesions present. Abnormal dentition. No oropharyngeal exudate, posterior oropharyngeal edema or posterior oropharyngeal erythema.    Neck: Normal range of motion. Neck supple.  Cardiovascular: Normal rate, regular rhythm and normal heart sounds.  Pulmonary/Chest: Breath sounds normal. She has no wheezes. She has no rales.  Musculoskeletal: She exhibits no edema (trace b/l LE edema).  Lymphadenopathy:    She has no cervical adenopathy.  Neurological: She is alert and oriented to person, place, and time. No cranial nerve deficit.  Skin: Skin is warm and dry.  Psychiatric: She has a normal mood and affect.    Assessment & Plan:   See Encounters Tab for problem based charting.  Patient discussed with Dr. Rebeca Alert

## 2018-06-09 ENCOUNTER — Telehealth: Payer: Self-pay | Admitting: Oncology

## 2018-06-09 NOTE — Telephone Encounter (Signed)
Spoke to patient to confirm morning [facility] appointment on 6/19, packet will be emailed to patient

## 2018-06-10 ENCOUNTER — Encounter: Payer: Self-pay | Admitting: *Deleted

## 2018-06-10 DIAGNOSIS — Z170 Estrogen receptor positive status [ER+]: Principal | ICD-10-CM | POA: Insufficient documentation

## 2018-06-10 DIAGNOSIS — C50411 Malignant neoplasm of upper-outer quadrant of right female breast: Secondary | ICD-10-CM | POA: Insufficient documentation

## 2018-06-11 NOTE — Progress Notes (Signed)
Internal Medicine Clinic Attending  Case discussed with Dr. LaCroce  at the time of the visit.  We reviewed the resident's history and exam and pertinent patient test results.  I agree with the assessment, diagnosis, and plan of care documented in the resident's note.  Alexander N Raines, MD   

## 2018-06-15 NOTE — Progress Notes (Addendum)
[name]  [phone] [phone]     ID: [name] DOB: [birth year]  MR#: [id number]  [id number]#:[id number]  Patient Care Team: Lorella Nimrod, MD as PCP - General (Internal Medicine) Burnell Blanks, MD as PCP - Cardiology (Cardiology) Alphonsa Overall, MD as Consulting Physician (General Surgery) Magrinat, Virgie Dad, MD as Consulting Physician (Oncology) Kyung Rudd, MD as Consulting Physician (Radiation Oncology) Burnell Blanks, MD as Consulting Physician (Cardiology) OTHER MD:   CHIEF COMPLAINT: Estrogen receptor positive breast cancer  CURRENT TREATMENT: Awaiting definitive surgery   HISTORY OF CURRENT ILLNESS: [name] had routine screening mammography on 05/27/2018 showing a possible abnormality in the right breast. She underwent unilateral right  diagnostic mammography with tomography and right breast ultrasonography at [facility] on 06/02/2018 showing: breast density category B. An area of distortion is identified in the upper outer quadrant of the right breast mammographically. Ultrasonography of this area found an irregular hypoechoic mass at the 11 o'clock upper outer quadrant measuring 1.8 x 1.8 x 1.4 cm and located 6 cm from the nipple. The right axilla is negative sonographically for abnormal lymph nodes.   Accordingly on 06/04/2018 she proceeded to biopsy of the right breast area in question. 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 Ductal carcinoma in situ high grade. Prognostic indicators significant for: estrogen receptor, 100% positive and progesterone receptor, 100% positive, both with strong staining intensity. Proliferation marker Ki67 at 8%. HER2  not amplified with ratios HER2/CEP17 signals 1.32 and average HER2 copies per cell 1.85  The patient's subsequent history is as detailed  below.  INTERVAL HISTORY: [name] was evaluated in the multidisciplinary breast cancer clinic on 06/16/2018 accompanied by her husband, [name]. Her case was also presented at the multidisciplinary breast cancer conference on the same day. At that time a preliminary plan was proposed: Breast conserving surgery, adjuvant radiation, antiestrogens (no Oncotypedue to co morbidities).   REVIEW OF SYSTEMS: There were no specific symptoms leading to the original mammogram, which was routinely scheduled.  The patient has significant functional limitation secondary to morbid obesity, asthma, and heart disease.  She is pretty much homebound.  She is on oxygen 24/7.  The patient denies unusual headaches, visual changes, nausea, vomiting, stiff neck, dizziness, or gait imbalance. There has been no cough, phlegm production, or pleurisy, no chest pain or pressure, and no change in bowel or bladder habits. The patient denies fever, rash, bleeding, unexplained fatigue or unexplained weight loss. A detailed review of systems was otherwise entirely negative.  PAST MEDICAL HISTORY: Past Medical History:  Diagnosis Date  . Anemia   . Aortic valve stenosis, severe   . 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 Asthma   . Blood transfusion    2013[facility]  . CAD (coronary artery disease)    Cath 2010 with DES x 1 RCA-- PT'S CARDIOLOGIST IS DR. Edmore  . Chronic diastolic congestive heart failure ([place])   . COPD (chronic obstructive pulmonary disease) ([place])   . Depression   . Diabetes mellitus DIAGNOSED IN2010   [facility]  . Eczema    on back  . Hyperlipidemia   . Hypertension   . Kidney stones   . Morbid obesity with body mass index of 60.0-69.9 in adult [facility])   .  Myocardial infarction ([place])    PT THINKS SHE WAS DX WITH MI AT THE TIME OF HEART STENTING  . Pulmonary embolism ([place]) 02/08/12   S/P RT TOTAL  KNEE ON 02/03/12--ON 02/08/12--DEVELOPED ACUTE SOB AND CHEST PAIN--AND DIAGNOSED WITH  PULMONARY EMBOLUS AND PNEUMONIA  . Restless leg syndrome   . Sleep apnea    uses 3 liters O2 at night   . Uterine fibroid    NO PROBLEMS AT PRESENT FROM THE FIBROIDS-STATES SHE IS POST MENOPAUSAL-LAST MENSES 2010 EXCEPT FOR EPISODE THIS YR OF BLEEDING RELATED TO FIBROIDS.  Marland Kitchen Weakness    BOTH HANDS - S/P BILATERAL CARPAL TUNNEL RELEASE--BUT STILL HAS WEAKNESS--OFTEN DROPS THINGS  Migraines at younger age. MI in 2009  PAST SURGICAL HISTORY: Past Surgical History:  Procedure Laterality Date  . CARDIAC CATHETERIZATION    . CARPAL TUNNEL RELEASE     Bilateral  . CHOLECYSTECTOMY    . CORONARY ANGIOPLASTY     2010 has stent in place  . CYSTOSCOPY W/ RETROGRADES Right 09/21/2013   Procedure: CYSTOSCOPY WITH RIGHT RETROGRADE PYELOGRAM RIGHT DOUBLE J STENT ;  Surgeon: Fredricka Bonine, MD;  Location: [facility];  Service: Urology;  Laterality: Right;  . CYSTOSCOPY WITH URETEROSCOPY AND STENT PLACEMENT Right 10/25/2013   Procedure: CYSTOSCOPY RIGHT URETEROSCOPY HOLMIUM LASER LITHO AND STENT PLACEMENT;  Surgeon: Fredricka Bonine, MD;  Location: [facility];  Service: Urology;  Laterality: Right;  . HERNIA REPAIR    . INTRAOPERATIVE TRANSESOPHAGEAL ECHOCARDIOGRAM N/A 12/12/2014   Procedure: INTRAOPERATIVE TRANSESOPHAGEAL ECHOCARDIOGRAM;  Surgeon: Burnell Blanks, MD;  Location: [facility] OR;  Service: Cardiovascular;  Laterality: N/A;  . JOINT REPLACEMENT     bil total knees  . KNEE ARTHROPLASTY  02/03/2012   Procedure: COMPUTER ASSISTED TOTAL KNEE ARTHROPLASTY;  Surgeon: Mcarthur Rossetti, MD;  Location: [place];  Service: Orthopedics;  Laterality: Right;  Right total knee arthroplasty  . LEFT AND RIGHT HEART CATHETERIZATION WITH CORONARY ANGIOGRAM N/A 03/17/2013   Procedure: LEFT AND RIGHT HEART CATHETERIZATION WITH CORONARY ANGIOGRAM;  Surgeon: Burnell Blanks, MD;  Location: [facility] CATH LAB;  Service:  Cardiovascular;  Laterality: N/A;  . LEFT AND RIGHT HEART CATHETERIZATION WITH CORONARY/GRAFT ANGIOGRAM N/A 09/14/2014   Procedure: LEFT AND RIGHT HEART CATHETERIZATION WITH Beatrix Fetters;  Surgeon: Burnell Blanks, MD;  Location: [facility] CATH LAB;  Service: Cardiovascular;  Laterality: N/A;  . TEE WITHOUT CARDIOVERSION N/A 03/14/2013   Procedure: TRANSESOPHAGEAL ECHOCARDIOGRAM (TEE);  Surgeon: Lelon Perla, MD;  Location: [facility] ENDOSCOPY;  Service: Cardiovascular;  Laterality: N/A;  . TEE WITHOUT CARDIOVERSION N/A 11/14/2014   Procedure: TRANSESOPHAGEAL ECHOCARDIOGRAM (TEE);  Surgeon: Lelon Perla, MD;  Location: [facility] ENDOSCOPY;  Service: Cardiovascular;  Laterality: N/A;  . TOTAL KNEE ARTHROPLASTY  09/10/2012   Procedure: TOTAL KNEE ARTHROPLASTY;  Surgeon: Mcarthur Rossetti, MD;  [facility];  Service: Orthopedics;  Laterality: Left;  . TOTAL KNEE REVISION Right 07/15/2013   Procedure: REVISION ARTHROPLASTY RIGHT KNEE;  Surgeon: Mcarthur Rossetti, MD;  [facility];  Service: Orthopedics;  Laterality: Right;  . TRANSCATHETER AORTIC VALVE REPLACEMENT, TRANSFEMORAL N/A 12/12/2014   Procedure: TRANSCATHETER AORTIC VALVE REPLACEMENT, TRANSFEMORAL;  Surgeon: Burnell Blanks, MD;  Location: [place];  Service: Cardiovascular;  Laterality: N/A;  . TRIGGER FINGER RELEASE  09/10/2012   Procedure: RELEASE TRIGGER FINGER/A-1 PULLEY;  Surgeon: Mcarthur Rossetti, MD;  [facility];  Service: Orthopedics;  Laterality: Right;  Right Ring Finger  . TUBAL LIGATION    Bovine Mitral Aortic valve  surgery 2015   FAMILY HISTORY Family History  Problem Relation Age of Onset  . Breast cancer Mother        stage IV at diagnosis  . Emphysema Mother        smoked  . Heart disease Mother   . COPD Father        smoked  . Asthma Father   . Heart disease Father   . Cancer Brother        Sinus  The patient's father died at age 31 due to emphysema and possibly cancer .  The patient's mother died at age 3 due to breast cancer with stage IV disease at presentation. The patient has 2 full and 1 half brothers and 1 sister.  One of the patient's brothers had nasopharyngeal cancer stage IV diagnosed at age 88. There was also a paternal great grandfather with prostate cancer. The patient denies a family history of ovarian cancer.    GYNECOLOGIC HISTORY:  Patient's last menstrual period was 02/03/2012. Menarche: 57 years old Age at first live birth: 57 years old She is [place] P2. Her LMP was around age 1. She used oral contraception in her 51'Z with no complications.    SOCIAL HISTORY:  Kimball is disabled. Her husband, [name] is retired from working in Health and safety inspector from [employer], Estate agent, and smoke damage. The patient's son, [name] 77 lives in [place] and works as a Biomedical scientist. The patient's son [name] 30 lives in [place] and is a Environmental education officer of a [employer]. The patient has no grandchildren. She attends [employer].     ADVANCED DIRECTIVES: not in place   HEALTH MAINTENANCE: Social History   Tobacco Use  . Smoking status: Former Smoker    Packs/day: 1.50    Years: 30.00    Pack years: 45.00    Types: Cigarettes    Last attempt to quit: 12/29/2000    Years since quitting: 17.4  . Smokeless tobacco: Never Used  Substance Use Topics  . Alcohol use: Not Currently    Alcohol/week: 0.6 oz    Types: 1 Cans of beer per week    Frequency: Never    Comment: rarely  . Drug use: No     Colonoscopy: about 8 year ago  PAP: 10/20/2016 normal   Bone density: no   No Known Allergies  Current Outpatient Medications  Medication Sig Dispense Refill  . albuterol (PROVENTIL HFA;VENTOLIN HFA) 108 (90 Base) MCG/ACT inhaler Inhale 2 puffs into the lungs every 4 (four) hours as needed for wheezing or shortness of breath. 2 Inhaler 3  . albuterol (PROVENTIL) (2.5 MG/3ML) 0.083% nebulizer solution INHALE 1 VIAL VIA NEBULIZER EVERY FOUR HOURS  AS NEEDED FOR WHEEZING OR SHORTNESS OF BREATH 180 mL 2  . Alirocumab (PRALUENT) 75 MG/ML SOPN Inject into the skin.    Marland Kitchen aspirin EC 81 MG tablet Take 1 tablet (81 mg total) by mouth daily. 90 tablet 3  . atorvastatin (LIPITOR) 80 MG tablet Take 1 tablet (80 mg total) by mouth daily. 90 tablet 3  . clopidogrel (PLAVIX) 75 MG tablet Take 1 tablet (75 mg total) by mouth every morning. 90 tablet 1  . Diclofenac Sodium 3 % CREA Apply 1 Dose topically 2 (two) times daily. 1 g 1  . DULoxetine (CYMBALTA) 30 MG capsule Take 1 capsule (30 mg total) by mouth 2 (two) times daily. 60 capsule 6  . ezetimibe (ZETIA) 10 MG tablet Take 1 tablet (10 mg total) by mouth daily.  90 tablet 3  . Fluticasone-Umeclidin-Vilant (TRELEGY ELLIPTA) 100-62.5-25 MCG/INH AEPB Inhale 1 puff into the lungs daily. 1 each 11  . furosemide (LASIX) 40 MG tablet Take 1 tablet (40 mg total) by mouth 2 (two) times daily. As needed for swelling 60 tablet 3  . gabapentin (NEURONTIN) 300 MG capsule TAKE 3 Capsules BY MOUTH EVERY NIGHT AT BEDTIME 90 capsule 1  . glucose blood (WAVESENSE PRESTO) test strip Use as instructed 100 each 12  . ibuprofen (ADVIL,MOTRIN) 200 MG tablet Take 400 mg by mouth every 6 (six) hours as needed for moderate pain.    Marland Kitchen losartan (COZAAR) 100 MG tablet Take 1 tablet (100 mg total) by mouth daily. 90 tablet 1  . megestrol (MEGACE) 40 MG tablet Take 2 tablets (80 mg total) by mouth 2 (two) times daily. 120 tablet 11  . meloxicam (MOBIC) 15 MG tablet TAKE 1 TABLET BY MOUTH EVERY DAY FOR 7 DAYS - STOP CLOPIDOGREL FOR THOSE 7 DAYS - THEN TAKE 1 TABLET DAILY ONLY IF NEEDED AS DIRECTED 30 tablet 0  . metoprolol tartrate (LOPRESSOR) 25 MG tablet Take 1 tablet (25 mg total) by mouth 2 (two) times daily. 180 tablet 1  . nystatin (MYCOSTATIN) 100000 UNIT/ML suspension Use as directed 5 mLs (500,000 Units total) in the mouth or throat 4 (four) times daily. 60 mL 0  . OXYGEN-HELIUM IN Inhale 3 L into the lungs daily.     .  potassium chloride SA (K-DUR,KLOR-CON) 20 MEQ tablet TAKE 2 Tablets BY MOUTH EVERY NIGHT AT BEDTIME DO NOT TAKE IF YOU DO NOT TAKE FUROSEMIDE 60 tablet 0  . Aspirin-Acetaminophen-Caffeine (GOODY HEADACHE PO) Take 1 Package by mouth daily as needed.    . nitroGLYCERIN (NITROSTAT) 0.4 MG SL tablet Place 1 tablet (0.4 mg total) under the tongue every 5 (five) minutes as needed for chest pain. 25 tablet 6   No current facility-administered medications for this visit.     OBJECTIVE: Morbidly obese white woman receiving oxygen through nasal cannula  Vitals:   06/16/18 0840  BP: (!) 168/92  Pulse: (!) 109  Resp: 18  Temp: 98.4 F (36.9 C)  SpO2: 96%     Body mass index is 67.26 kg/m.   Wt Readings from Last 3 Encounters:  06/16/18 (!) 344 lb 6.4 oz (156.2 kg)  06/08/18 (!) 342 lb 8 oz (155.4 kg)  05/27/18 (!) 343 lb 9.6 oz (155.9 kg)      ECOG FS:3 - Symptomatic, >50% confined to bed  Ocular: Sclerae unicteric, pupils round and equal Ear-nose-throat: Oropharynx clear and moist Lymphatic: No cervical or supraclavicular adenopathy Lungs no rales or rhonchi Heart regular rate and rhythm Abd soft, obese, nontender, positive bowel sounds MSK no focal spinal tenderness, no joint edema Neuro: non-focal, well-oriented, positive affect Breasts: The right breast is status post recent biopsy.  There is no palpable mass.  The left breast is benign.  Both axillae are benign.   LAB RESULTS:  CMP     Component Value Date/Time   NA 137 06/16/2018 0818   K 4.5 06/16/2018 0818   CL 99 06/16/2018 0818   CO2 29 06/16/2018 0818   GLUCOSE 281 (H) 06/16/2018 0818   BUN 11 06/16/2018 0818   CREATININE 0.87 06/16/2018 0818   CREATININE 0.87 07/09/2012 1603   CALCIUM 9.4 06/16/2018 0818   PROT 7.2 06/16/2018 0818   PROT 6.4 03/25/2018 1028   ALBUMIN 3.6 06/16/2018 0818   ALBUMIN 3.8 03/25/2018 1028   AST 16  06/16/2018 0818   ALT 22 06/16/2018 0818   ALKPHOS 88 06/16/2018 0818   BILITOT 0.7  06/16/2018 0818   GFRNONAA >60 06/16/2018 0818   GFRAA >60 06/16/2018 0818    No results found for: TOTALPROTELP, ALBUMINELP, A1GS, A2GS, BETS, BETA2SER, GAMS, MSPIKE, SPEI  No results found for: KPAFRELGTCHN, LAMBDASER, KAPLAMBRATIO  Lab Results  Component Value Date   WBC 6.6 06/16/2018   NEUTROABS 4.1 06/16/2018   HGB 12.3 06/16/2018   HCT 37.4 06/16/2018   MCV 80.7 06/16/2018   PLT 203 06/16/2018    '@LASTCHEMISTRY' @  No results found for: LABCA2  No components found for: [id number]  No results for input(s): INR in the last 168 hours.  No results found for: LABCA2  No results found for: ZYY482  No results found for: NOI370  No results found for: WUG891  No results found for: CA2729  No components found for: HGQUANT  No results found for: CEA1 / No results found for: CEA1   No results found for: AFPTUMOR  No results found for: CHROMOGRNA  No results found for: PSA1  Appointment on 06/16/2018  Component Date Value Ref Range Status  . Sodium 06/16/2018 137  136 - 145 mmol/L Final  . Potassium 06/16/2018 4.5  3.5 - 5.1 mmol/L Final  . Chloride 06/16/2018 99  98 - 109 mmol/L Final  . CO2 06/16/2018 29  22 - 29 mmol/L Final  . Glucose, Bld 06/16/2018 281* 70 - 140 mg/dL Final  . BUN 06/16/2018 11  7 - 26 mg/dL Final  . Creatinine 06/16/2018 0.87  0.60 - 1.10 mg/dL Final  . Calcium 06/16/2018 9.4  8.4 - 10.4 mg/dL Final  . Total Protein 06/16/2018 7.2  6.4 - 8.3 g/dL Final  . Albumin 06/16/2018 3.6  3.5 - 5.0 g/dL Final  . AST 06/16/2018 16  5 - 34 U/L Final  . ALT 06/16/2018 22  0 - 55 U/L Final  . Alkaline Phosphatase 06/16/2018 88  40 - 150 U/L Final  . Total Bilirubin 06/16/2018 0.7  0.2 - 1.2 mg/dL Final  . GFR, Est Non Af Am 06/16/2018 >60  >60 mL/min Final  . GFR, Est AFR Am 06/16/2018 >60  >60 mL/min Final   Comment: (NOTE) The eGFR has been calculated using the CKD EPI equation. This calculation has not been validated in all clinical  situations. eGFR's persistently <60 mL/min signify possible Chronic Kidney Disease.   Georgiann Hahn gap 06/16/2018 9  3 - 11 Final   Performed at [facility], [place] [address] [place], [place] [address]  . WBC Count 06/16/2018 6.6  3.9 - 10.3 K/uL Final  . RBC 06/16/2018 4.63  3.70 - 5.45 MIL/uL Final  . Hemoglobin 06/16/2018 12.3  11.6 - 15.9 g/dL Final  . HCT 06/16/2018 37.4  34.8 - 46.6 % Final  . MCV 06/16/2018 80.7  79.5 - 101.0 fL Final  . MCH 06/16/2018 26.6  25.1 - 34.0 pg Final  . MCHC 06/16/2018 33.0  31.5 - 36.0 g/dL Final  . RDW 06/16/2018 15.5* 11.2 - 14.5 % Final  . Platelet Count 06/16/2018 203  145 - 400 K/uL Final  . Neutrophils Relative % 06/16/2018 60  % Final  . Neutro Abs 06/16/2018 4.1  1.5 - 6.5 K/uL Final  . Lymphocytes Relative 06/16/2018 29  % Final  . Lymphs Abs 06/16/2018 1.9  0.9 - 3.3 K/uL Final  . Monocytes Relative 06/16/2018 6  % Final  . Monocytes Absolute 06/16/2018  0.4  0.1 - 0.9 K/uL Final  . Eosinophils Relative 06/16/2018 4  % Final  . Eosinophils Absolute 06/16/2018 0.2  0.0 - 0.5 K/uL Final  . Basophils Relative 06/16/2018 1  % Final  . Basophils Absolute 06/16/2018 0.0  0.0 - 0.1 K/uL Final   Performed at [facility], [place] [address] [place], [place] [address]    (this displays the last labs from the last 3 days)  No results found for: TOTALPROTELP, ALBUMINELP, A1GS, A2GS, BETS, BETA2SER, GAMS, MSPIKE, SPEI (this displays SPEP labs)  No results found for: KPAFRELGTCHN, LAMBDASER, KAPLAMBRATIO (kappa/lambda light chains)  No results found for: HGBA, HGBA2QUANT, HGBFQUANT, HGBSQUAN (Hemoglobinopathy evaluation)   No results found for: LDH  Lab Results  Component Value Date   IRON 38 (L) 02/10/2012   TIBC 310 02/10/2012   IRONPCTSAT 12 (L) 02/10/2012   (Iron and TIBC)  Lab Results  Component Value Date   FERRITIN 27 02/09/2018    Urinalysis    Component Value Date/Time    COLORURINE YELLOW 01/12/2018 0840   APPEARANCEUR HAZY (A) 01/12/2018 0840   LABSPEC 1.028 01/12/2018 0840   PHURINE 5.0 01/12/2018 0840   GLUCOSEU NEGATIVE 01/12/2018 0840   HGBUR NEGATIVE 01/12/2018 0840   BILIRUBINUR NEGATIVE 01/12/2018 0840   KETONESUR NEGATIVE 01/12/2018 0840   PROTEINUR NEGATIVE 01/12/2018 0840   UROBILINOGEN 1.0 12/08/2014 1303   NITRITE NEGATIVE 01/12/2018 0840   LEUKOCYTESUR TRACE (A) 01/12/2018 0840     STUDIES: US Breast Ltd Uni Right Inc Axilla  Result Date: 06/02/2018 CLINICAL DATA:  Patient returns after baseline screening study for evaluation of a possible RIGHT breast distortion. Patient's mother was diagnosed with breast cancer in her late 55's. EXAM: DIGITAL DIAGNOSTIC RIGHT MAMMOGRAM WITH CAD AND TOMO ULTRASOUND RIGHT BREAST COMPARISON:  05/27/2018 ACR Breast Density Category b: There are scattered areas of fibroglandular density. FINDINGS: Additional 2-D and 3-D images are performed. Persistent area of distortion is identified in the UPPER-OUTER QUADRANT of the RIGHT breast and further evaluated sonographically. Mammographic images were processed with CAD. Targeted ultrasound is performed, showing an irregular hypoechoic mass with irregular margins, posterior acoustic shadowing, and internal vascularity. Masses in the 11 o'clock location 6 centimeters from the nipple, and measures 1.8 x 1.8 x 1.4 centimeters. Evaluation of the RIGHT axilla is negative for adenopathy. IMPRESSION: Irregular suspicious mass in the 11 o'clock location of the RIGHT breast. No ultrasound evidence for RIGHT axillary adenopathy. RECOMMENDATION: Ultrasound-guided core biopsy of RIGHT breast mass. I have discussed the findings and recommendations with the patient and her husband. Results were also provided in writing at the conclusion of the visit. If applicable, a reminder letter will be sent to the patient regarding the next appointment. BI-RADS CATEGORY  4: Suspicious. Electronically  Signed   By: Nolon Nations M.D.   On: 06/02/2018 12:17   Mm Diag Breast Tomo Uni Right  Result Date: 06/02/2018 CLINICAL DATA:  Patient returns after baseline screening study for evaluation of a possible RIGHT breast distortion. Patient's mother was diagnosed with breast cancer in her late 27's. EXAM: DIGITAL DIAGNOSTIC RIGHT MAMMOGRAM WITH CAD AND TOMO ULTRASOUND RIGHT BREAST COMPARISON:  05/27/2018 ACR Breast Density Category b: There are scattered areas of fibroglandular density. FINDINGS: Additional 2-D and 3-D images are performed. Persistent area of distortion is identified in the UPPER-OUTER QUADRANT of the RIGHT breast and further evaluated sonographically. Mammographic images were processed with CAD. Targeted ultrasound is performed, showing an irregular hypoechoic mass with irregular margins, posterior  acoustic shadowing, and internal vascularity. Masses in the 11 o'clock location 6 centimeters from the nipple, and measures 1.8 x 1.8 x 1.4 centimeters. Evaluation of the RIGHT axilla is negative for adenopathy. IMPRESSION: Irregular suspicious mass in the 11 o'clock location of the RIGHT breast. No ultrasound evidence for RIGHT axillary adenopathy. RECOMMENDATION: Ultrasound-guided core biopsy of RIGHT breast mass. I have discussed the findings and recommendations with the patient and her husband. Results were also provided in writing at the conclusion of the visit. If applicable, a reminder letter will be sent to the patient regarding the next appointment. BI-RADS CATEGORY  4: Suspicious. Electronically Signed   By: Nolon Nations M.D.   On: 06/02/2018 12:17   Ms Digital Screening Tomo Bilateral  Result Date: 05/27/2018 CLINICAL DATA:  Screening. EXAM: DIGITAL SCREENING BILATERAL MAMMOGRAM WITH TOMO AND CAD COMPARISON:  None. ACR Breast Density Category b: There are scattered areas of fibroglandular density. FINDINGS: In the right breast, possible distortion warrants further evaluation. In the  left breast, no findings suspicious for malignancy. Images were processed with CAD. IMPRESSION: Further evaluation is suggested for possible distortion in the right breast. RECOMMENDATION: Diagnostic mammogram and possibly ultrasound of the right breast. (Code:[id number]) The patient will be contacted regarding the findings, and additional imaging will be scheduled. BI-RADS CATEGORY  0: Incomplete. Need additional imaging evaluation and/or prior mammograms for comparison. Electronically Signed   By: Evangeline Dakin M.D.   On: 05/27/2018 16:52   Mm Clip Placement Right  Result Date: 06/04/2018 CLINICAL DATA:  Ultrasound-guided core needle biopsy was performed of a mass in the 11 o'clock position of the right breast. EXAM: DIAGNOSTIC RIGHT MAMMOGRAM POST ULTRASOUND BIOPSY COMPARISON:  Previous exam(s). FINDINGS: Mammographic images were obtained following ultrasound guided biopsy of a right breast mass at 11 o'clock position. A ribbon shaped biopsy clip is satisfactorily positioned in the mass. IMPRESSION: Satisfactory position of ribbon shaped biopsy clip. Final Assessment: Post Procedure Mammograms for Marker Placement Electronically Signed   By: Curlene Dolphin M.D.   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 This was found to be concordant by Dr. Curlene Dolphin. Pathology results were discussed with the patient by telephone. The patient reported doing well after the biopsy with tenderness at the site. Post biopsy instructions and care were reviewed and questions were answered. The patient was encouraged to call [facility] for any additional concerns. The patient was referred to [facility] on June 16, 2018. Pathology results reported by Terie Purser, RN on 06/07/2018. Electronically Signed   By: Curlene Dolphin M.D.   On: 06/07/2018 14:11   Result Date: 06/08/2018 CLINICAL DATA:  Mass identified in the right breast on recent baseline screening mammogram, with suspicious features on diagnostic evaluation. Biopsy was recommended. EXAM: ULTRASOUND GUIDED RIGHT BREAST CORE NEEDLE BIOPSY COMPARISON:  Previous exam(s). FINDINGS: I met with the patient and we discussed the procedure of ultrasound-guided biopsy, including benefits and alternatives. We discussed the high likelihood of a successful procedure. We discussed the risks of the procedure, including infection, bleeding, tissue injury, clip migration, and inadequate sampling. Informed written consent was given. The usual time-out protocol was  performed immediately prior to the procedure. Lesion quadrant: Upper outer quadrant Using sterile technique and 1% Lidocaine as local anesthetic, under direct ultrasound visualization, a 12 gauge spring-loaded device was used to perform biopsy of a suspicious mass in the 11 o'clock position of the right breast using a lateral approach. At the conclusion of the procedure a tissue marker clip was deployed into the biopsy cavity. Follow up 2 view mammogram was performed and dictated separately. IMPRESSION: Ultrasound guided biopsy of the right breast. No apparent complications. Electronically Signed: By: Curlene Dolphin M.D. On: 06/04/2018 13:58    ELIGIBLE FOR AVAILABLE RESEARCH PROTOCOL: no  ASSESSMENT: 57 y.o. [place], [place] woman status post right breast upper outer quadrant biopsy 06/04/2018 for a clinical T1c N0, stage IA invasive ductal carcinoma, grade 1, estrogen and progesterone receptor strongly positive, HER-2 not amplified, with an MIB-108%  (1) definitive surgery pending  (2) adjuvant radiation to follow  (3) aromatase inhibitors to  start at the completion of local treatment  PLAN: We spent the better part of today's hour-long appointment discussing the biology of her diagnosis and the specifics of her situation.  We first reviewed the fact that cancer is not one disease but more than 100 different diseases and that it is important to keep them separate-- otherwise when friends and relatives discuss their own cancer experiences with Adilynn confusion can result. Similarly we explained that if breast cancer spreads to the bone or liver, the patient would not have bone cancer or liver cancer, but breast cancer in the bone and breast cancer in the liver: one cancer in three places-- not 3 different cancers which otherwise would have to be treated in 3 different ways.  We discussed the difference between local and systemic therapy. In terms of loco-regional treatment, lumpectomy plus radiation is equivalent to mastectomy as far as survival is concerned. For this reason, and because the cosmetic results are generally superior, we recommend breast conserving surgery.   We then discussed the rationale for systemic therapy. There is some risk that this cancer may have already spread to other parts of her body. Patients frequently ask at this point about bone scans, CAT scans and PET scans to find out if they have occult breast cancer somewhere else. The problem is that in early stage disease we are much more likely to find false positives then true cancers and this would expose the patient to unnecessary procedures as well as unnecessary radiation. Scans cannot answer the question the patient really would like to know, which is whether she has microscopic disease elsewhere in her body. For those reasons we do not recommend them.  Of course we would proceed to aggressive evaluation of any symptoms that might suggest metastatic disease, but that is not the case here.  Next we went over the options for systemic therapy which are  anti-estrogens, anti-HER-2 immunotherapy, and chemotherapy. Brittay does not meet criteria for anti-HER-2 immunotherapy. She is a good candidate for anti-estrogens.  The question of chemotherapy is more complicated. Chemotherapy is most effective in rapidly growing, aggressive tumors. It is much less effective in low-grade, slow growing cancers, like Nallely 's.  Given that concern, and her multiple comorbidities, which in my view are much more significant in terms of prognosis that her breast cancer, we are going to bypass Oncotype testing and avoid chemotherapy in this case.  The plan then is for surgery to be followed by radiation, then aromatase inhibitors.  Amanie has a good understanding of the overall  plan. She agrees with it. She knows the goal of treatment in her case is cure. She will call with any problems that may develop before her next visit here.   Magrinat, Virgie Dad, MD  06/18/18 12:37 PM Medical Oncology and Hematology [facility] [address] [place], [place] [address] Tel. [phone]    Fax. [phone]  Alice Rieger, am acting as scribe for Chauncey Cruel MD.  I, Lurline Del MD, have reviewed the above documentation for accuracy and completeness, and I agree with the above.    Removed sensitive 07/03/2018

## 2018-06-16 ENCOUNTER — Encounter (HOSPITAL_COMMUNITY): Payer: Self-pay | Admitting: *Deleted

## 2018-06-16 ENCOUNTER — Inpatient Hospital Stay: Payer: Medicaid Other

## 2018-06-16 ENCOUNTER — Encounter: Payer: Self-pay | Admitting: Radiation Oncology

## 2018-06-16 ENCOUNTER — Encounter: Payer: Self-pay | Admitting: *Deleted

## 2018-06-16 ENCOUNTER — Ambulatory Visit
Admission: RE | Admit: 2018-06-16 | Discharge: 2018-06-16 | Disposition: A | Discharge status: Home / Self Care | Payer: Self-pay | Source: Ambulatory Visit | Attending: Radiation Oncology | Admitting: Radiation Oncology

## 2018-06-16 ENCOUNTER — Inpatient Hospital Stay: Payer: Medicaid Other | Attending: Oncology | Admitting: Oncology

## 2018-06-16 ENCOUNTER — Telehealth: Payer: Self-pay

## 2018-06-16 ENCOUNTER — Encounter: Payer: Self-pay | Admitting: Oncology

## 2018-06-16 VITALS — BP 168/92 | HR 109 | Temp 98.4°F | Resp 18 | Ht 60.0 in | Wt 344.4 lb

## 2018-06-16 DIAGNOSIS — E119 Type 2 diabetes mellitus without complications: Secondary | ICD-10-CM

## 2018-06-16 DIAGNOSIS — Z6841 Body Mass Index (BMI) 40.0 and over, adult: Secondary | ICD-10-CM | POA: Insufficient documentation

## 2018-06-16 DIAGNOSIS — C50411 Malignant neoplasm of upper-outer quadrant of right female breast: Secondary | ICD-10-CM

## 2018-06-16 DIAGNOSIS — Z953 Presence of xenogenic heart valve: Secondary | ICD-10-CM

## 2018-06-16 DIAGNOSIS — Z170 Estrogen receptor positive status [ER+]: Principal | ICD-10-CM

## 2018-06-16 LAB — CBC WITH DIFFERENTIAL (CANCER CENTER ONLY)
BASOS PCT: 1 %
Basophils Absolute: 0 10*3/uL (ref 0.0–0.1)
EOS ABS: 0.2 10*3/uL (ref 0.0–0.5)
Eosinophils Relative: 4 %
HCT: 37.4 % (ref 34.8–46.6)
HEMOGLOBIN: 12.3 g/dL (ref 11.6–15.9)
LYMPHS ABS: 1.9 10*3/uL (ref 0.9–3.3)
Lymphocytes Relative: 29 %
MCH: 26.6 pg (ref 25.1–34.0)
MCHC: 33 g/dL (ref 31.5–36.0)
MCV: 80.7 fL (ref 79.5–101.0)
MONO ABS: 0.4 10*3/uL (ref 0.1–0.9)
MONOS PCT: 6 %
NEUTROS PCT: 60 %
Neutro Abs: 4.1 10*3/uL (ref 1.5–6.5)
Platelet Count: 203 10*3/uL (ref 145–400)
RBC: 4.63 MIL/uL (ref 3.70–5.45)
RDW: 15.5 % — AB (ref 11.2–14.5)
WBC Count: 6.6 10*3/uL (ref 3.9–10.3)

## 2018-06-16 LAB — CMP (CANCER CENTER ONLY)
ALBUMIN: 3.6 g/dL (ref 3.5–5.0)
ALK PHOS: 88 U/L (ref 40–150)
ALT: 22 U/L (ref 0–55)
AST: 16 U/L (ref 5–34)
Anion gap: 9 (ref 3–11)
BUN: 11 mg/dL (ref 7–26)
CALCIUM: 9.4 mg/dL (ref 8.4–10.4)
CHLORIDE: 99 mmol/L (ref 98–109)
CO2: 29 mmol/L (ref 22–29)
CREATININE: 0.87 mg/dL (ref 0.60–1.10)
GFR, Est AFR Am: >60 mL/min (ref >60)
GFR, Estimated: >60 mL/min (ref >60)
GLUCOSE: 281 mg/dL — AB (ref 70–140)
Potassium: 4.5 mmol/L (ref 3.5–5.1)
Sodium: 137 mmol/L (ref 136–145)
Total Bilirubin: 0.7 mg/dL (ref 0.2–1.2)
Total Protein: 7.2 g/dL (ref 6.4–8.3)

## 2018-06-16 NOTE — Progress Notes (Signed)
Clinical Social Work [place] Psychosocial Distress Screening [place]  Patient completed distress screening protocol and scored a 7 on the Psychosocial Distress Thermometer which indicates moderate distress. Clinical Social Worker met with patient and patients husband in [facility] to assess for distress and other psychosocial needs. Patient stated she was feeling overwhelmed but felt "better" after meeting with the treatment team and getting more information on her treatment plan. CSW and patient discussed common feeling and emotions when being diagnosed with cancer, and the importance of support during treatment. CSW informed patient of the support team and support services at [facility], and patient was agreeable to an [employer] referral. CSW provided contact information and encouraged patient to call with any questions or concerns.  ONCBCN DISTRESS SCREENING 06/16/2018  Screening Type Initial Screening  Distress experienced in past week (1-10) 7  Family Problem type Other (comment)  Emotional problem type Depression;Nervousness/Anxiety  Physical Problem type Skin dry/itchy  Physician notified of physical symptoms Yes  Referral to support programs Yes    Johnnye Lana, MSW, LCSW, OSW-C Clinical Social Worker [place] [phone]

## 2018-06-16 NOTE — Progress Notes (Signed)
Nutrition Assessment  Reason for Assessment:  Pt seen in Breast Clinic  ASSESSMENT:   57 year old female with new breast cancer diagnosis.  Past medical history reviewed.  Medications:  reviewed  Labs: glucose 281  Anthropometrics:   Height: 60 inches Weight: 344 lb BMI: 67    NUTRITION DIAGNOSIS: Food and nutrition related knowledge deficit related to new diagnosis of breast cancer as evidenced by no prior need for nutrition related information.  INTERVENTION:   Discussed and provided packet of information regarding nutritional tips for breast cancer patients.  Questions answered.  Teachback method used.  Contact information provided and patient knows to contact me with questions/concerns.    MONITORING, EVALUATION, and GOAL: Pt will consume a healthy plant based diet to maintain lean body mass throughout treatment.   Meliana Canner B. Zenia Resides, [place], [place] Registered Dietitian [phone] (pager)

## 2018-06-16 NOTE — Progress Notes (Signed)
Radiation Oncology         [phone] ________________________________  Name: [name]        MRN: [id number]  Date of Service: 06/16/2018 DOB: 03-Feb-1961  WH:QPRF, Soundra Pilon, MD  Alphonsa Overall, MD     REFERRING PHYSICIAN: Alphonsa Overall, MD   DIAGNOSIS: The encounter diagnosis was Malignant neoplasm of upper-outer quadrant of right breast in female, estrogen receptor positive ([place]).   HISTORY OF PRESENT ILLNESS: [name] is a 57 y.o. female seen in the multidisciplinary breast clinic for a new diagnosis of right breast cancer. The patient was noted to have a screening distortion on recent mammogram. She underwent diagnostic imagign and at 11:00 there was a mass measuring 1.8 x 1.8 x 1.4 cm and her axilla was also negative for adenopathy by that ultrasound on 06/02/18. 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 Her tumor was ER/PR positive, HER2 amplified, and her Ki67 was 8%. She comes today to discuss treatment options for her cancer.  PREVIOUS RADIATION THERAPY: No   PAST MEDICAL HISTORY:  Past Medical History:  Diagnosis Date  . Anemia   . Aortic valve stenosis, severe   . 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 Asthma   . Blood transfusion    [facility]  . CAD (coronary artery disease)    Cath 2010 with DES x 1 RCA-- PT'S CARDIOLOGIST IS DR. Reserve  . Chronic diastolic congestive heart failure ([place])   . COPD (chronic obstructive pulmonary disease) ([place])   . Depression   . Diabetes mellitus DIAGNOSED IN2010   [facility]  . Eczema    on back  . Hyperlipidemia   . Hypertension   . Kidney stones   . Morbid obesity with body mass index of 60.0-69.9 in adult [facility])   . Myocardial infarction ([place])    PT THINKS SHE WAS DX WITH MI AT THE TIME OF HEART STENTING  . Pulmonary embolism ([place]) 02/08/12   S/P RT TOTAL KNEE ON 02/03/12--ON 02/08/12--DEVELOPED ACUTE SOB AND CHEST PAIN--AND DIAGNOSED WITH  PULMONARY EMBOLUS AND PNEUMONIA  . Restless leg syndrome   . Sleep apnea    uses 3 liters O2 at night   . Uterine fibroid    NO PROBLEMS AT PRESENT FROM THE FIBROIDS-STATES SHE IS POST MENOPAUSAL-LAST MENSES 2010 EXCEPT FOR EPISODE THIS YR OF BLEEDING RELATED TO FIBROIDS.  Marland Kitchen Weakness    BOTH HANDS - S/P BILATERAL CARPAL TUNNEL RELEASE--BUT STILL HAS WEAKNESS--OFTEN DROPS THINGS       PAST SURGICAL HISTORY: Past Surgical History:  Procedure Laterality Date  . CARDIAC CATHETERIZATION    . CARPAL TUNNEL RELEASE     Bilateral  . CHOLECYSTECTOMY    . CORONARY ANGIOPLASTY     2010 has stent in place  . CYSTOSCOPY W/ RETROGRADES Right 09/21/2013   Procedure: CYSTOSCOPY WITH RIGHT RETROGRADE PYELOGRAM RIGHT DOUBLE J STENT ;  Surgeon: Fredricka Bonine, MD;  Location: [facility];  Service: Urology;  Laterality: Right;  . CYSTOSCOPY WITH URETEROSCOPY AND STENT PLACEMENT Right 10/25/2013   Procedure: CYSTOSCOPY RIGHT URETEROSCOPY HOLMIUM LASER LITHO AND STENT PLACEMENT;  Surgeon: Fredricka Bonine, MD;  Location: [facility];  Service: Urology;  Laterality: Right;  . HERNIA REPAIR    . INTRAOPERATIVE TRANSESOPHAGEAL ECHOCARDIOGRAM N/A 12/12/2014   Procedure:  INTRAOPERATIVE TRANSESOPHAGEAL ECHOCARDIOGRAM;  Surgeon: Burnell Blanks, MD;  Location: [facility] OR;  Service: Cardiovascular;  Laterality: N/A;  . JOINT REPLACEMENT     bil total knees  . KNEE ARTHROPLASTY  02/03/2012   Procedure: COMPUTER ASSISTED TOTAL KNEE ARTHROPLASTY;  Surgeon: Mcarthur Rossetti, MD;  Location: [place];  Service: Orthopedics;  Laterality: Right;  Right total knee arthroplasty  . LEFT AND RIGHT HEART CATHETERIZATION WITH CORONARY ANGIOGRAM N/A 03/17/2013   Procedure: LEFT AND RIGHT HEART CATHETERIZATION WITH CORONARY ANGIOGRAM;  Surgeon: Burnell Blanks, MD;  Location: [facility] C[facility] LAB;  Service: Cardiovascular;   Laterality: N/A;  . LEFT AND RIGHT HEART CATHETERIZATION WITH CORONARY/GRAFT ANGIOGRAM N/A 09/14/2014   Procedure: LEFT AND RIGHT HEART CATHETERIZATION WITH Beatrix Fetters;  Surgeon: Burnell Blanks, MD;  Location: [facility] CATH LAB;  Service: Cardiovascular;  Laterality: N/A;  . TEE WITHOUT CARDIOVERSION N/A 03/14/2013   Procedure: TRANSESOPHAGEAL ECHOCARDIOGRAM (TEE);  Surgeon: Lelon Perla, MD;  Location: [facility] ENDOSCOPY;  Service: Cardiovascular;  Laterality: N/A;  . TEE WITHOUT CARDIOVERSION N/A 11/14/2014   Procedure: TRANSESOPHAGEAL ECHOCARDIOGRAM (TEE);  Surgeon: Lelon Perla, MD;  Location: [facility] ENDOSCOPY;  Service: Cardiovascular;  Laterality: N/A;  . TOTAL KNEE ARTHROPLASTY  09/10/2012   Procedure: TOTAL KNEE ARTHROPLASTY;  Surgeon: Mcarthur Rossetti, MD;  [facility];  Service: Orthopedics;  Laterality: Left;  . TOTAL KNEE REVISION Right 07/15/2013   Procedure: REVISION ARTHROPLASTY RIGHT KNEE;  Surgeon: Mcarthur Rossetti, MD;  Location[facility];  Service: Orthopedics;  Laterality: Right;  . TRANSCATHETER AORTIC VALVE REPLACEMENT, TRANSFEMORAL N/A 12/12/2014   Procedure: TRANSCATHETER AORTIC VALVE REPLACEMENT, TRANSFEMORAL;  Surgeon: Burnell Blanks, MD;  Location: [place];  Service: Cardiovascular;  Laterality: N/A;  . TRIGGER FINGER RELEASE  09/10/2012   Procedure: RELEASE TRIGGER FINGER/A-1 PULLEY;  Surgeon: Mcarthur Rossetti, MD;  Location[facility];  Service: Orthopedics;  Laterality: Right;  Right Ring Finger  . TUBAL LIGATION       FAMILY HISTORY:  Family History  Problem Relation Age of Onset  . Breast cancer Mother        stage IV at diagnosis  . Emphysema Mother        smoked  . Heart disease Mother   . COPD Father        smoked  . Asthma Father   . Heart disease Father   . Cancer Brother        Sinus     SOCIAL HISTORY:  reports that she quit smoking about 17 years ago. Her smoking use included cigarettes. She has a 45.00  pack-year smoking history. She has never used smokeless tobacco. She reports that she drank about 0.6 oz of alcohol per week. She reports that she does not use drugs. The patient is married and lives in [place]. She is accompanied by her husband.   ALLERGIES: Patient has no known allergies.   MEDICATIONS:  Current Outpatient Medications  Medication Sig Dispense Refill  . albuterol (PROVENTIL HFA;VENTOLIN HFA) 108 (90 Base) MCG/ACT inhaler Inhale 2 puffs into the lungs every 4 (four) hours as needed for wheezing or shortness of breath. 2 Inhaler 3  . albuterol (PROVENTIL) (2.5 MG/3ML) 0.083% nebulizer solution INHALE 1 VIAL VIA NEBULIZER EVERY FOUR HOURS AS NEEDED FOR WHEEZING OR SHORTNESS OF BREATH 180 mL 2  . Alirocumab (PRALUENT) 75 MG/ML SOPN Inject into the skin.    Marland Kitchen aspirin EC 81 MG tablet Take 1 tablet (81 mg total) by mouth daily. 90 tablet  3  . Aspirin-Acetaminophen-Caffeine (GOODY HEADACHE PO) Take 1 Package by mouth daily as needed.    Marland Kitchen atorvastatin (LIPITOR) 80 MG tablet Take 1 tablet (80 mg total) by mouth daily. 90 tablet 3  . clopidogrel (PLAVIX) 75 MG tablet Take 1 tablet (75 mg total) by mouth every morning. 90 tablet 1  . Diclofenac Sodium 3 % CREA Apply 1 Dose topically 2 (two) times daily. 1 g 1  . DULoxetine (CYMBALTA) 30 MG capsule Take 1 capsule (30 mg total) by mouth 2 (two) times daily. 60 capsule 6  . ezetimibe (ZETIA) 10 MG tablet Take 1 tablet (10 mg total) by mouth daily. 90 tablet 3  . Fluticasone-Umeclidin-Vilant (TRELEGY ELLIPTA) 100-62.5-25 MCG/INH AEPB Inhale 1 puff into the lungs daily. 1 each 11  . furosemide (LASIX) 40 MG tablet Take 1 tablet (40 mg total) by mouth 2 (two) times daily. As needed for swelling 60 tablet 3  . gabapentin (NEURONTIN) 300 MG capsule TAKE 3 Capsules BY MOUTH EVERY NIGHT AT BEDTIME 90 capsule 1  . glucose blood (WAVESENSE PRESTO) test strip Use as instructed 100 each 12  . ibuprofen (ADVIL,MOTRIN) 200 MG tablet Take 400 mg by  mouth every 6 (six) hours as needed for moderate pain.    Marland Kitchen losartan (COZAAR) 100 MG tablet Take 1 tablet (100 mg total) by mouth daily. 90 tablet 1  . megestrol (MEGACE) 40 MG tablet Take 2 tablets (80 mg total) by mouth 2 (two) times daily. 120 tablet 11  . meloxicam (MOBIC) 15 MG tablet TAKE 1 TABLET BY MOUTH EVERY DAY FOR 7 DAYS - STOP CLOPIDOGREL FOR THOSE 7 DAYS - THEN TAKE 1 TABLET DAILY ONLY IF NEEDED AS DIRECTED 30 tablet 0  . metoprolol tartrate (LOPRESSOR) 25 MG tablet Take 1 tablet (25 mg total) by mouth 2 (two) times daily. 180 tablet 1  . nitroGLYCERIN (NITROSTAT) 0.4 MG SL tablet Place 1 tablet (0.4 mg total) under the tongue every 5 (five) minutes as needed for chest pain. 25 tablet 6  . nystatin (MYCOSTATIN) 100000 UNIT/ML suspension Use as directed 5 mLs (500,000 Units total) in the mouth or throat 4 (four) times daily. 60 mL 0  . OXYGEN-HELIUM IN Inhale 3 L into the lungs daily.     . potassium chloride SA (K-DUR,KLOR-CON) 20 MEQ tablet TAKE 2 Tablets BY MOUTH EVERY NIGHT AT BEDTIME DO NOT TAKE IF YOU DO NOT TAKE FUROSEMIDE 60 tablet 0   No current facility-administered medications for this encounter.      REVIEW OF SYSTEMS: On review of systems, the patient reports that she is doing well overall. She denies any chest pain, shortness of breath, cough, fevers, chills, night sweats, unintended weight changes. She denies any bowel or bladder disturbances, and denies abdominal pain, nausea or vomiting. She denies any new musculoskeletal or joint aches or pains. A complete review of systems is obtained and is otherwise negative.     PHYSICAL EXAM:  Wt Readings from Last 3 Encounters:  06/16/18 (!) 344 lb 6.4 oz (156.2 kg)  06/08/18 (!) 342 lb 8 oz (155.4 kg)  05/27/18 (!) 343 lb 9.6 oz (155.9 kg)   Temp Readings from Last 3 Encounters:  06/16/18 98.4 F (36.9 C) (Oral)  06/08/18 97.8 F (36.6 C) (Oral)  03/12/18 98.1 F (36.7 C) (Oral)   BP Readings from Last 3  Encounters:  06/16/18 (!) 168/92  06/08/18 140/76  05/27/18 126/82   Pulse Readings from Last 3 Encounters:  06/16/18 (!) 109  06/08/18 81  04/08/18 90     In general this is a chronically ill appearing obese caucasian female in no acute distress. She is alert and oriented x4 and appropriate throughout the examination. HEENT reveals that the patient is normocephalic, atraumatic. EOMs are intact. PERRLA. Skin is intact without any evidence of gross lesions. Cardiovascular exam reveals a regular rate and rhythm, no clicks rubs or murmurs are auscultated. Chest is clear to auscultation bilaterally. Lymphatic assessment is performed and does not reveal any adenopathy in the cervical, supraclavicular, axillary, or inguinal chains. Bilateral breast exam is performed and reveals fullness and dense breasts bilaterally, no palpable mass per se is noted on the right but post biopsy change is seen. No nipple bleeding or discharge is noted of either breast. Abdomen has active bowel sounds in all quadrants and is intact. The abdomen is soft, non tender, non distended. Lower extremities are negative for pretibial pitting edema, deep calf tenderness, cyanosis or clubbing.   ECOG = 0  0 - Asymptomatic (Fully active, able to carry on all predisease activities without restriction)  1 - Symptomatic but completely ambulatory (Restricted in physically strenuous activity but ambulatory and able to carry out work of a light or sedentary nature. For example, light housework, office work)  2 - Symptomatic, <50% in bed during the day (Ambulatory and capable of all self care but unable to carry out any work activities. Up and about more than 50% of waking hours)  3 - Symptomatic, >50% in bed, but not bedbound (Capable of only limited self-care, confined to bed or chair 50% or more of waking hours)  4 - Bedbound (Completely disabled. Cannot carry on any self-care. Totally confined to bed or chair)  5 - Death   Eustace Pen  MM, Creech RH, Tormey DC, et al. [phone]). "Toxicity and response criteria of the [facility]". [place] Oncol. 5 (6): 649-55    LABORATORY DATA:  Lab Results  Component Value Date   WBC 6.6 06/16/2018   HGB 12.3 06/16/2018   HCT 37.4 06/16/2018   MCV 80.7 06/16/2018   PLT 203 06/16/2018   Lab Results  Component Value Date   NA 138 01/25/2018   K 4.5 01/25/2018   CL 100 (L) 01/25/2018   CO2 24 01/25/2018   Lab Results  Component Value Date   ALT 17 03/25/2018   AST 14 03/25/2018   ALKPHOS 75 03/25/2018   BILITOT 0.6 03/25/2018      RADIOGRAPHY: US Breast Ltd Uni Right Inc Axilla  Result Date: 06/02/2018 CLINICAL DATA:  Patient returns after baseline screening study for evaluation of a possible RIGHT breast distortion. Patient's mother was diagnosed with breast cancer in her late 69's. EXAM: DIGITAL DIAGNOSTIC RIGHT MAMMOGRAM WITH CAD AND TOMO ULTRASOUND RIGHT BREAST COMPARISON:  05/27/2018 ACR Breast Density Category b: There are scattered areas of fibroglandular density. FINDINGS: Additional 2-D and 3-D images are performed. Persistent area of distortion is identified in the UPPER-OUTER QUADRANT of the RIGHT breast and further evaluated sonographically. Mammographic images were processed with CAD. Targeted ultrasound is performed, showing an irregular hypoechoic mass with irregular margins, posterior acoustic shadowing, and internal vascularity. Masses in the 11 o'clock location 6 centimeters from the nipple, and measures 1.8 x 1.8 x 1.4 centimeters. Evaluation of the RIGHT axilla is negative for adenopathy. IMPRESSION: Irregular suspicious mass in the 11 o'clock location of the RIGHT breast. No ultrasound evidence for RIGHT axillary adenopathy. RECOMMENDATION: Ultrasound-guided core biopsy of RIGHT breast mass.  I have discussed the findings and recommendations with the patient and her husband. Results were also provided in writing at the conclusion of the  visit. If applicable, a reminder letter will be sent to the patient regarding the next appointment. BI-RADS CATEGORY  4: Suspicious. Electronically Signed   By: Nolon Nations M.D.   On: 06/02/2018 12:17   Mm Diag Breast Tomo Uni Right  Result Date: 06/02/2018 CLINICAL DATA:  Patient returns after baseline screening study for evaluation of a possible RIGHT breast distortion. Patient's mother was diagnosed with breast cancer in her late 52's. EXAM: DIGITAL DIAGNOSTIC RIGHT MAMMOGRAM WITH CAD AND TOMO ULTRASOUND RIGHT BREAST COMPARISON:  05/27/2018 ACR Breast Density Category b: There are scattered areas of fibroglandular density. FINDINGS: Additional 2-D and 3-D images are performed. Persistent area of distortion is identified in the UPPER-OUTER QUADRANT of the RIGHT breast and further evaluated sonographically. Mammographic images were processed with CAD. Targeted ultrasound is performed, showing an irregular hypoechoic mass with irregular margins, posterior acoustic shadowing, and internal vascularity. Masses in the 11 o'clock location 6 centimeters from the nipple, and measures 1.8 x 1.8 x 1.4 centimeters. Evaluation of the RIGHT axilla is negative for adenopathy. IMPRESSION: Irregular suspicious mass in the 11 o'clock location of the RIGHT breast. No ultrasound evidence for RIGHT axillary adenopathy. RECOMMENDATION: Ultrasound-guided core biopsy of RIGHT breast mass. I have discussed the findings and recommendations with the patient and her husband. Results were also provided in writing at the conclusion of the visit. If applicable, a reminder letter will be sent to the patient regarding the next appointment. BI-RADS CATEGORY  4: Suspicious. Electronically Signed   By: Nolon Nations M.D.   On: 06/02/2018 12:17   Ms Digital Screening Tomo Bilateral  Result Date: 05/27/2018 CLINICAL DATA:  Screening. EXAM: DIGITAL SCREENING BILATERAL MAMMOGRAM WITH TOMO AND CAD COMPARISON:  None. ACR Breast Density  Category b: There are scattered areas of fibroglandular density. FINDINGS: In the right breast, possible distortion warrants further evaluation. In the left breast, no findings suspicious for malignancy. Images were processed with CAD. IMPRESSION: Further evaluation is suggested for possible distortion in the right breast. RECOMMENDATION: Diagnostic mammogram and possibly ultrasound of the right breast. (Code:[id number]-33M) The patient will be contacted regarding the findings, and additional imaging will be scheduled. BI-RADS CATEGORY  0: Incomplete. Need additional imaging evaluation and/or prior mammograms for comparison. Electronically Signed   By: Evangeline Dakin M.D.   On: 05/27/2018 16:52   Mm Clip Placement Right  Result Date: 06/04/2018 CLINICAL DATA:  Ultrasound-guided core needle biopsy was performed of a mass in the 11 o'clock position of the right breast. EXAM: DIAGNOSTIC RIGHT MAMMOGRAM POST ULTRASOUND BIOPSY COMPARISON:  Previous exam(s). FINDINGS: Mammographic images were obtained following ultrasound guided biopsy of a right breast mass at 11 o'clock position. A ribbon shaped biopsy clip is satisfactorily positioned in the mass. IMPRESSION: Satisfactory position of ribbon shaped biopsy clip. Final Assessment: Post Procedure Mammograms for Marker Placement Electronically Signed   By: Curlene Dolphin M.D.   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 This was found to be concordant by Dr. Curlene Dolphin. Pathology results were discussed with the patient by telephone. The patient reported doing well after the  biopsy with tenderness at the site. Post biopsy instructions and care were reviewed and questions were answered. The patient was  encouraged to call [facility] for any additional concerns. The patient was referred to [facility] on June 16, 2018. Pathology results reported by Terie Purser, RN on 06/07/2018. Electronically Signed   By: Curlene Dolphin M.D.   On: 06/07/2018 14:11   Result Date: 06/08/2018 CLINICAL DATA:  Mass identified in the right breast on recent baseline screening mammogram, with suspicious features on diagnostic evaluation. Biopsy was recommended. EXAM: ULTRASOUND GUIDED RIGHT BREAST CORE NEEDLE BIOPSY COMPARISON:  Previous exam(s). FINDINGS: I met with the patient and we discussed the procedure of ultrasound-guided biopsy, including benefits and alternatives. We discussed the high likelihood of a successful procedure. We discussed the risks of the procedure, including infection, bleeding, tissue injury, clip migration, and inadequate sampling. Informed written consent was given. The usual time-out protocol was performed immediately prior to the procedure. Lesion quadrant: Upper outer quadrant Using sterile technique and 1% Lidocaine as local anesthetic, under direct ultrasound visualization, a 12 gauge spring-loaded device was used to perform biopsy of a suspicious mass in the 11 o'clock position of the right breast using a lateral approach. At the conclusion of the procedure a tissue marker clip was deployed into the biopsy cavity. Follow up 2 view mammogram was performed and dictated separately. IMPRESSION: Ultrasound guided biopsy of the right breast. No apparent complications. Electronically Signed: By: Curlene Dolphin M.D. On: 06/04/2018 13:58       IMPRESSION/PLAN: 1. Stage IA, [id number], grade 1 ER/PR Positive invasive ductal carcinoma with calcifications and high grade DCIS of the right breast.Dr. Lisbeth Renshaw discusses the pathology findings and reviews the nature of invasive right breast disease. The consensus from  the breast conference includes breast conservation with lumpectomy with sentinel node biopsy. Her tumor will be tested for Oncotype Dx score to determine a role for systemic therapy. Provided that chemotherapy is not indicated, the patient's course would then be followed by external radiotherapy to the breast followed by antiestrogen therapy. We discussed the risks, benefits, short, and long term effects of radiotherapy, and the patient is interested in proceeding. Dr. Lisbeth Renshaw discusses the delivery and logistics of radiotherapy and anticipates a course of 4 or 6 1/2 weeks of radiotherapy. The patient is counseled on the options to consider prone set up as well. With discussion, she is also considering mastectomy, in which case we would not anticipate a role for radiotherapy. She will consider her options, and make decisions with Dr. Lucia Gaskins.  We will see her back about 2 weeks after surgery if she undergoes lumpectomy to discuss the simulation process and anticipate we starting radiotherapy about 4-6 weeks after surgery.  2. COPD, O2 dependant. The patient remains on 3L [place] continuously. She will discuss her oxygen needs with Dr. Lucia Gaskins as she considers surgery.  The above documentation reflects my direct findings during this shared patient visit. Please see the separate note by Dr. Lisbeth Renshaw on this date for the remainder of the patient's plan of care.    Carola Rhine, PAC

## 2018-06-16 NOTE — Telephone Encounter (Signed)
   [facility] Pre-operative Risk Assessment    Request for surgical clearance:  1. What type of surgery is being performed? BREST LUMPECTOMY  2. When is this surgery scheduled? TBD   3. What type of clearance is required (medical clearance vs. Pharmacy clearance to hold med vs. Both)? BOTH  4. Are there any medications that need to be held prior to surgery and how long? PLAVIX - INSTRUCTIONS ON HOW TO HOLD   5. Practice name and name of physician performing surgery? [facility] - DR. DAVID NEWMAN   6. What is your office phone number [phone]    7.   What is your office fax number [phone]  8.   Anesthesia type (None, local, MAC, general) ? GENERAL   [name] 06/16/2018, 5:00 PM  _________________________________________________________________   (provider comments below)

## 2018-06-16 NOTE — Addendum Note (Signed)
Encounter addended by: Hayden Pedro, PA-C on: 06/16/2018 9:39 AM  Actions taken: LOS modified

## 2018-06-17 NOTE — Telephone Encounter (Signed)
OK to hold Plavix prior to procedure.   Lauree Chandler

## 2018-06-17 NOTE — Telephone Encounter (Signed)
Dr. Angelena Form, this pt is planned for breast lumpectomy and they are requesting to hold Plavix and how long. Can you please address this. 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   Please route response back to CV DIV PREOP  Thanks

## 2018-06-17 NOTE — Telephone Encounter (Signed)
   Primary Cardiologist: Lauree Chandler, MD  Chart reviewed as part of pre-operative protocol coverage. Patient was contacted 06/17/2018 in reference to pre-operative risk assessment for pending surgery as outlined below.  [name] was last seen on 03/22/2018 by Dr. Angelena [name].  Since that day, [name] has done well at her baseline. She has a history of TAVR 11/2014, CAD s/p stents, COPD on oxygen, PE with her knee surgery, DM. She is quite sedentary and not able to achieve over 4 METs of activity due to her breathing.  Per the RCRI risk calculator [name] is a class III risk with 6.6% risk of major cardiac event perioperatively. Her being very sedentary also increases her risk. She will need close hemodynamic monitoring, avoiding volume overload. I discussed her risk with [name] and she understands and wishes to proceed.   Therefore, based on ACC/AHA guidelines, the patient would be at acceptable risk for the planned procedure without further cardiovascular testing.   Per Dr. Angelena [name] it is OK to hold Plavix prior to the procedure as needed.   I will route this recommendation to the requesting party via Epic fax function and remove from pre-op pool.  Please call with questions.  Daune Perch, NP 06/17/2018, 4:15 PM

## 2018-06-18 ENCOUNTER — Telehealth: Payer: Self-pay | Admitting: Oncology

## 2018-06-18 ENCOUNTER — Ambulatory Visit: Payer: Self-pay | Admitting: Surgery

## 2018-06-18 DIAGNOSIS — C50911 Malignant neoplasm of unspecified site of right female breast: Secondary | ICD-10-CM

## 2018-06-18 DIAGNOSIS — Z170 Estrogen receptor positive status [ER+]: Principal | ICD-10-CM

## 2018-06-18 NOTE — Telephone Encounter (Signed)
Called regarding 10/14 °

## 2018-06-21 ENCOUNTER — Other Ambulatory Visit: Payer: Self-pay | Admitting: Surgery

## 2018-06-21 DIAGNOSIS — Z170 Estrogen receptor positive status [ER+]: Principal | ICD-10-CM

## 2018-06-21 DIAGNOSIS — C50911 Malignant neoplasm of unspecified site of right female breast: Secondary | ICD-10-CM

## 2018-06-22 ENCOUNTER — Telehealth: Payer: Self-pay | Admitting: *Deleted

## 2018-06-22 DIAGNOSIS — C50411 Malignant neoplasm of upper-outer quadrant of right female breast: Secondary | ICD-10-CM

## 2018-06-22 DIAGNOSIS — Z170 Estrogen receptor positive status [ER+]: Principal | ICD-10-CM

## 2018-06-22 NOTE — Telephone Encounter (Signed)
Left vm regarding [facility] from 6.19.19. Contact information provided for questions or needs.

## 2018-06-23 ENCOUNTER — Encounter: Payer: Self-pay | Admitting: Radiation Oncology

## 2018-06-28 HISTORY — PX: BREAST LUMPECTOMY: SHX2

## 2018-06-30 NOTE — Pre-Procedure Instructions (Addendum)
[name]  06/30/2018      [facility] - [place] [place] [address] Phone: [phone] Fax: [phone]  [place], [place] - [address] [place] [address] Phone: [phone] Fax: [phone]  [place], [place] - [address] [place] [address] Phone: [phone] Fax: [phone]    Your procedure is scheduled on Fri., July 09, 2018 from 7:30AM-9:30AM  Report to [facility] Admitting  Entrance "A" at 5:30AM  Call this number if you have problems the morning of surgery:  [phone]   Remember:  Do not eat or drink after midnight on July 11th  Please complete your PRE-SURGERY ENSURE that was given to by 4:30AM (3 hours prior to surgery) the morning of surgery.  Please, if able, drink it in one setting. DO NOT SIP.   Take these medicines the morning of surgery with A SIP OF WATER By 4:30AM: DULoxetine (CYMBALTA), Metoprolol tartrate (LOPRESSOR), and Fluticasone-Umeclidin-Vilant (TRELEGY ELLIPTA). If needed Tetrahydrozoline HCl (VISINE OP), NitroGLYCERIN (NITROSTAT) (Notify the nurse if you had to take this medicine), and Albuterol Nebulizer/Inhlaer (Bring inhaler with you the day of surgery).  Follow your doctors instructions regarding your Aspirin and Plavix.  If no instructions were given by your doctor, then you will need to call the prescribing office office to get instructions.    As of today, stop taking all Other Aspirin Products, Vitamins, Fish oils, and Herbal medications. Also stop all NSAIDS i.e. Advil, Ibuprofen, Motrin, Aleve, Anaprox, Naproxen, BC, Goody Powders, and all Supplements. Including: Meloxicam (MOBIC)  How to Manage Your Diabetes Before and After Surgery  Why is it important to control my blood sugar before and after  surgery? . Improving blood sugar levels before and after surgery helps healing and can limit problems. . A way of improving blood sugar control is eating a healthy diet by: o  Eating less sugar and carbohydrates o  Increasing activity/exercise o  Talking with your doctor about reaching your blood sugar goals . High blood sugars (greater than 180 mg/dL) can raise your risk of infections and slow your recovery, so you will need to focus on controlling your diabetes during the weeks before surgery. . Make sure that the doctor who takes care of your diabetes knows about your planned surgery including the date and location.  How do I manage my blood sugar before surgery? . Check your blood sugar at least 4 times a day, starting 2 days before surgery, to make sure that the level is not too high or low. o Check your blood sugar the morning of your surgery when you wake up and every 2 hours until you get to the Short Stay unit. . If your blood sugar is less than 70 mg/dL, you will need to treat for low blood sugar: o Do not take insulin. o Treat a low blood sugar (less than 70 mg/dL) with  cup of clear juice (cranberry or apple), 4 glucose tablets, OR glucose gel. Recheck blood sugar in 15 minutes after treatment (to make sure it is greater than 70 mg/dL). If your blood sugar is not greater than 70 mg/dL on recheck, call [phone] o  for further instructions. . Report your blood sugar to the short stay nurse when you get to Short Stay.  . If you are admitted to the  hospital after surgery: o Your blood sugar will be checked by the staff and you will probably be given insulin after surgery (instead of oral diabetes medicines) to make sure you have good blood sugar levels. o The goal for blood sugar control after surgery is 80-180 mg/dL.  . If your CBG is greater than 220 mg/dL, call us at [phone]  Reviewed and Endorsed by [facility] Patient Education Committee, August 2015    Do not wear  jewelry, make-up or nail polish.  Do not wear lotions, powders, or perfumes, or deodorant.  Do not shave 48 hours prior to surgery.    Do not bring valuables to the hospital.  [facility] is not responsible for any belongings or valuables.  Contacts, dentures or bridgework may not be worn into surgery.  Leave your suitcase in the car.  After surgery it may be brought to your room.  For patients admitted to the hospital, discharge time will be determined by your treatment team.  Patients discharged the day of surgery will not be allowed to drive home.   Special instructions:   [place]- Preparing For Surgery  Before surgery, you can play an important role. Because skin is not sterile, your skin needs to be as free of germs as possible. You can reduce the number of germs on your skin by washing with CHG (chlorahexidine gluconate) Soap before surgery.  CHG is an antiseptic cleaner which kills germs and bonds with the skin to continue killing germs even after washing.    Oral Hygiene is also important to reduce your risk of infection.  Remember - BRUSH YOUR TEETH THE MORNING OF SURGERY WITH YOUR REGULAR TOOTHPASTE  Please do not use if you have an allergy to CHG or antibacterial soaps. If your skin becomes reddened/irritated stop using the CHG.  Do not shave (including legs and underarms) for at least 48 hours prior to first CHG shower. It is OK to shave your face.  Please follow these instructions carefully.   1. Shower the NIGHT BEFORE SURGERY and the MORNING OF SURGERY with CHG.   2. If you chose to wash your hair, wash your hair first as usual with your normal shampoo.  3. After you shampoo, rinse your hair and body thoroughly to remove the shampoo.  4. Use CHG as you would any other liquid soap. You can apply CHG directly to the skin and wash gently with a scrungie or a clean washcloth.   5. Apply the CHG Soap to your body ONLY FROM THE NECK DOWN.  Do not use on open wounds or  open sores. Avoid contact with your eyes, ears, mouth and genitals (private parts). Wash Face and genitals (private parts)  with your normal soap.  6. Wash thoroughly, paying special attention to the area where your surgery will be performed.  7. Thoroughly rinse your body with warm water from the neck down.  8. DO NOT shower/wash with your normal soap after using and rinsing off the CHG Soap.  9. Pat yourself dry with a CLEAN TOWEL.  10. Wear CLEAN PAJAMAS to bed the night before surgery, wear comfortable clothes the morning of surgery  11. Place CLEAN SHEETS on your bed the night of your first shower and DO NOT SLEEP WITH PETS.  Day of Surgery:  Do not apply any deodorants/lotions.  Please wear clean clothes to the hospital/surgery center.   Remember to brush your teeth WITH YOUR REGULAR TOOTHPASTE.  Please read over the following fact  sheets that you were given. Pain Booklet, Coughing and Deep Breathing and Surgical Site Infection Prevention

## 2018-07-02 ENCOUNTER — Encounter (HOSPITAL_COMMUNITY)
Admission: RE | Admit: 2018-07-02 | Discharge: 2018-07-02 | Disposition: A | Discharge status: Home / Self Care | Payer: Medicaid Other | Source: Ambulatory Visit | Attending: Surgery | Admitting: Surgery

## 2018-07-02 ENCOUNTER — Encounter (HOSPITAL_COMMUNITY): Payer: Self-pay | Admitting: Emergency Medicine

## 2018-07-02 ENCOUNTER — Telehealth: Payer: Self-pay | Admitting: Internal Medicine

## 2018-07-02 ENCOUNTER — Other Ambulatory Visit: Payer: Self-pay | Admitting: Internal Medicine

## 2018-07-02 ENCOUNTER — Encounter (HOSPITAL_COMMUNITY): Payer: Self-pay | Admitting: Anesthesiology

## 2018-07-02 ENCOUNTER — Other Ambulatory Visit: Payer: Self-pay

## 2018-07-02 ENCOUNTER — Encounter (HOSPITAL_COMMUNITY): Payer: Self-pay

## 2018-07-02 DIAGNOSIS — Z9981 Dependence on supplemental oxygen: Secondary | ICD-10-CM | POA: Insufficient documentation

## 2018-07-02 DIAGNOSIS — Z87442 Personal history of urinary calculi: Secondary | ICD-10-CM | POA: Diagnosis not present

## 2018-07-02 DIAGNOSIS — E669 Obesity, unspecified: Secondary | ICD-10-CM | POA: Insufficient documentation

## 2018-07-02 DIAGNOSIS — I252 Old myocardial infarction: Secondary | ICD-10-CM | POA: Diagnosis not present

## 2018-07-02 DIAGNOSIS — G4733 Obstructive sleep apnea (adult) (pediatric): Secondary | ICD-10-CM | POA: Diagnosis not present

## 2018-07-02 DIAGNOSIS — F329 Major depressive disorder, single episode, unspecified: Secondary | ICD-10-CM | POA: Insufficient documentation

## 2018-07-02 DIAGNOSIS — Z9049 Acquired absence of other specified parts of digestive tract: Secondary | ICD-10-CM | POA: Diagnosis not present

## 2018-07-02 DIAGNOSIS — I110 Hypertensive heart disease with heart failure: Secondary | ICD-10-CM | POA: Diagnosis not present

## 2018-07-02 DIAGNOSIS — G2581 Restless legs syndrome: Secondary | ICD-10-CM | POA: Diagnosis not present

## 2018-07-02 DIAGNOSIS — Z96653 Presence of artificial knee joint, bilateral: Secondary | ICD-10-CM | POA: Diagnosis not present

## 2018-07-02 DIAGNOSIS — Z01812 Encounter for preprocedural laboratory examination: Secondary | ICD-10-CM | POA: Insufficient documentation

## 2018-07-02 DIAGNOSIS — Z96652 Presence of left artificial knee joint: Secondary | ICD-10-CM | POA: Insufficient documentation

## 2018-07-02 DIAGNOSIS — I2510 Atherosclerotic heart disease of native coronary artery without angina pectoris: Secondary | ICD-10-CM | POA: Diagnosis not present

## 2018-07-02 DIAGNOSIS — E119 Type 2 diabetes mellitus without complications: Secondary | ICD-10-CM | POA: Diagnosis not present

## 2018-07-02 DIAGNOSIS — Z952 Presence of prosthetic heart valve: Secondary | ICD-10-CM | POA: Diagnosis not present

## 2018-07-02 DIAGNOSIS — E785 Hyperlipidemia, unspecified: Secondary | ICD-10-CM | POA: Insufficient documentation

## 2018-07-02 DIAGNOSIS — Z9889 Other specified postprocedural states: Secondary | ICD-10-CM | POA: Insufficient documentation

## 2018-07-02 DIAGNOSIS — Z955 Presence of coronary angioplasty implant and graft: Secondary | ICD-10-CM | POA: Insufficient documentation

## 2018-07-02 DIAGNOSIS — Z7982 Long term (current) use of aspirin: Secondary | ICD-10-CM | POA: Insufficient documentation

## 2018-07-02 DIAGNOSIS — Z79899 Other long term (current) drug therapy: Secondary | ICD-10-CM | POA: Insufficient documentation

## 2018-07-02 DIAGNOSIS — Z791 Long term (current) use of non-steroidal anti-inflammatories (NSAID): Secondary | ICD-10-CM | POA: Insufficient documentation

## 2018-07-02 DIAGNOSIS — C50911 Malignant neoplasm of unspecified site of right female breast: Secondary | ICD-10-CM | POA: Diagnosis not present

## 2018-07-02 DIAGNOSIS — Z86711 Personal history of pulmonary embolism: Secondary | ICD-10-CM | POA: Diagnosis not present

## 2018-07-02 DIAGNOSIS — I5032 Chronic diastolic (congestive) heart failure: Secondary | ICD-10-CM | POA: Diagnosis not present

## 2018-07-02 DIAGNOSIS — K219 Gastro-esophageal reflux disease without esophagitis: Secondary | ICD-10-CM | POA: Diagnosis not present

## 2018-07-02 DIAGNOSIS — J449 Chronic obstructive pulmonary disease, unspecified: Secondary | ICD-10-CM | POA: Insufficient documentation

## 2018-07-02 DIAGNOSIS — Z6841 Body Mass Index (BMI) 40.0 and over, adult: Secondary | ICD-10-CM | POA: Insufficient documentation

## 2018-07-02 HISTORY — DX: Malignant neoplasm of unspecified site of unspecified female breast: C50.919

## 2018-07-02 LAB — CBC
HEMATOCRIT: 40.5 % (ref 36.0–46.0)
Hemoglobin: 12.7 g/dL (ref 12.0–15.0)
MCH: 26.2 pg (ref 26.0–34.0)
MCHC: 31.4 g/dL (ref 30.0–36.0)
MCV: 83.7 fL (ref 78.0–100.0)
Platelets: 220 10*3/uL (ref 150–400)
RBC: 4.84 MIL/uL (ref 3.87–5.11)
RDW: 14 % (ref 11.5–15.5)
WBC: 7 10*3/uL (ref 4.0–10.5)

## 2018-07-02 LAB — BASIC METABOLIC PANEL
ANION GAP: 10 (ref 5–15)
BUN: 8 mg/dL (ref 6–20)
CALCIUM: 8.9 mg/dL (ref 8.9–10.3)
CO2: 25 mmol/L (ref 22–32)
Chloride: 99 mmol/L (ref 98–111)
Creatinine, Ser: 0.82 mg/dL (ref 0.44–1.00)
GFR calc non Af Amer: >60 mL/min (ref >60)
Glucose, Bld: 346 mg/dL — ABNORMAL HIGH (ref 70–99)
POTASSIUM: 4.2 mmol/L (ref 3.5–5.1)
SODIUM: 134 mmol/L — AB (ref 135–145)

## 2018-07-02 LAB — HEMOGLOBIN A1C
Hgb A1c MFr Bld: 9.1 % — ABNORMAL HIGH (ref 4.8–5.6)
Mean Plasma Glucose: 214.47 mg/dL

## 2018-07-02 LAB — GLUCOSE, CAPILLARY: GLUCOSE-CAPILLARY: 335 mg/dL — AB (ref 70–99)

## 2018-07-02 MED ORDER — GLUCOSE BLOOD VI STRP: ORAL_STRIP | 12 refills | Status: DC

## 2018-07-02 MED ORDER — RELION CONFIRM GLUCOSE MONITOR W/DEVICE KIT: 1.0000 [IU] | PACK | Freq: Three times a day (TID) | 0 refills | Status: DC

## 2018-07-02 NOTE — Telephone Encounter (Signed)
Sent a prescription to [facility].

## 2018-07-02 NOTE — Telephone Encounter (Signed)
Spoke with [name] at [facility]. States least expensive option for patient is the Relion meter and strips at [employer] [facility] [employer]. Spoke with patient and she is amenable to this. Please send Rx for Relion meter and strips to [facility] on [address]. Hubbard Hartshorn, RN, BSN

## 2018-07-02 NOTE — Telephone Encounter (Signed)
Needs prescription for diabetic meter and test strips, pt lost previous meter when moved, send to [place], pt contact [phone]

## 2018-07-02 NOTE — Progress Notes (Signed)
PCP - Dr. Genene Churn- [facility]  Cardiologist - Dr. Angelena Form  Chest x-ray - 01/25/18 (E)  EKG - 01/25/18 (E)  Stress Test - Denies  ECHO - 12/28/15 (E)  Cardiac Cath - 09/14/14 (E)  Sleep Study - Yes- Positive CPAP - No- Uses O2 at 3L continuous  LABS- 07/02/18: CBC, BMP  ASA-LD- 7/6 Plavix- LD 7/6  HA1C- 07/02/18 Fasting Blood Sugar - Today 335 Checks Blood Sugar __0___ times a day- Pt sts she lost her meter during her move. Pt advised to call her pcp to obtain, as her surgery can be cancelled if the bs is abnormal. Pt also made aware of the same for her bp. Pt sts she did not take any of her medicines today.  Anesthesia- Yes- Abnormal bp and bs. Levada Dy, NP for anesthesia made aware.  Pt denies having chest pain, sob, or fever at this time. All instructions explained to the pt, with a verbal understanding of the material. Pt agrees to go over the instructions while at home for a better understanding. The opportunity to ask questions was provided.

## 2018-07-03 ENCOUNTER — Other Ambulatory Visit: Payer: Self-pay | Admitting: Oncology

## 2018-07-05 ENCOUNTER — Encounter (HOSPITAL_COMMUNITY): Payer: Self-pay | Admitting: *Deleted

## 2018-07-05 NOTE — Progress Notes (Addendum)
Anesthesia Chart Review:  Case:  [id number] Date/Time:  07/09/18 0715   Procedure:  BREAST LUMPECTOMY WITH RADIOACTIVE SEED AND SENTINEL LYMPH NODE BIOPSY (Right Breast)   Anesthesia type:  General   Pre-op diagnosis:  RIGHT BREAST CANCER   Location:  [place] OR ROOM 01 / [facility]   Surgeon:  [name] Overall, MD      DISCUSSION: 57 yo female scheduled for above procedure. History of severe aortic stenosis now s/p TAVR, CAD, OSA (continuous O2, no CPAP), HTN, HLD, obesity, GERD, asthma, COPD, former tobacco abuse, depression.  Cardiac clearance from [name] Perch, NP in telephone encounter 06/17/2018: "She is quite sedentary and not able to achieve over 4 METs of activity due to her breathing. Per the RCRI risk calculator [name] is a class III risk with 6.6% risk of major cardiac event perioperatively. Her being very sedentary also increases her risk. She will need close hemodynamic monitoring, avoiding volume overload. I discussed her risk with [name] and she understands and wishes to proceed. Therefore, based on ACC/AHA guidelines, the patient would be at acceptable risk for the planned procedure without further cardiovascular testing. Per [name] it is OK to hold Plavix prior to the procedure as needed."  Pt with DMII, previously on medication but now diet controlled and pt states she does not check her BS. PAT testing revealed BS of 335 and A1c of 9.1. Results called to surgeon's office and staff message sent to [name].  If pt's blood sugar acceptable on day of surgery anticipate she can proceed with procedure as planned.  ADDENDUM (07/08/2018): Pt was seen 07/07/2018 by [name] for uncontrolled DMII. She recommended intraoperative insulin drip per anesthesia for BS control. I also discussed case with diabetes coordinator who agreed with plan per [name].  VS: BP (!) 176/74 Comment: notified [name] Merritts, RN of BP and CBG  Pulse 98   Temp 36.8 C (Oral)   Ht 5' (1.524 m)   Wt (!) 343 lb  5 oz (155.7 kg)   LMP 02/03/2012   SpO2 96%   BMI 67.05 kg/m   PROVIDERS: [name] Nimrod, MD is PCP last seen 03/12/2018  [name] Blanks, MD is Cardiologist last seen 03/22/2018  [name] Rockers, MD is pulmonologist last seen 12/05/2014   LABS: Elevated blood glucose and A1c, informed surgeon and PCP of these values. (all labs ordered are listed, but only abnormal results are displayed)  Labs Reviewed  GLUCOSE, CAPILLARY - Abnormal; Notable for the following components:      Result Value   Glucose-Capillary 335 (*)    All other components within normal limits  BASIC METABOLIC PANEL - Abnormal; Notable for the following components:   Sodium 134 (*)    Glucose, Bld 346 (*)    All other components within normal limits  HEMOGLOBIN A1C - Abnormal; Notable for the following components:   Hgb A1c MFr Bld 9.1 (*)    All other components within normal limits  CBC     IMAGES: 01/25/2018 CHEST  2 VIEW COMPARISON:  Chest x-rays dated 02/16/2016 and 08/06/2015. FINDINGS: Heart size is upper normal, stable. [name] cardiomediastinal silhouette appears stable. No new airspace opacity to suggest a developing pneumonia. No pleural effusion or pneumothorax seen. Osseous structures about the chest are unremarkable. IMPRESSION: No active cardiopulmonary disease. No evidence of pneumonia or pulmonary edema.  EKG: 01/26/2018: shows NSR  CV: ECHO 12/28/2015  - Left ventricle: The cavity size was normal. There was mild   concentric  hypertrophy. Systolic function was vigorous. The   estimated ejection fraction was in the range of 65% to 70%. Wall   motion was normal; there were no regional wall motion   abnormalities. Doppler parameters are consistent with abnormal   left ventricular relaxation (grade 1 diastolic dysfunction).   Doppler parameters are consistent with elevated ventricular   end-diastolic filling pressure. - Aortic valve: A bioprosthetic Edward-SAPIEN valve is  seated well   in the artic position, leaflets are poorly visualized.   There is trivial aortic regurgitation. Transaortic gradients are   elevated with mean 30 mmHg, peak gradient 42 mmHg. Trileaflet;   normal thickness leaflets. - Aortic root: The aortic root was normal in size. - Mitral valve: Calcified annulus. Mildly thickened leaflets . - Left atrium: The atrium was mildly dilated. - Right atrium: The atrium was normal in size. - Tricuspid valve: There was no regurgitation. - Pulmonary arteries: Systolic pressure was within the normal   range. - Inferior vena cava: The vessel was normal in size. - Pericardium, extracardiac: There was no pericardial effusion.  Impressions:  - A bioprosthetic Edward-SAPIEN valve is seated well in the artic   position, leaflets are poorly visualized.   There is trivial aortic regurgitation. Transaortic gradients are   elevated with mean 30 mmHg, peak gradient 42 mmHg.   The gradients are slightly elevated when compared to the prior   study from 02/07/2015 (mean 25 mmHg, peak 38 mmHg).  Past Medical History:  Diagnosis Date  . Anemia   . Aortic valve stenosis, severe   . 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 Asthma   . Blood transfusion    2013[facility]  . Breast cancer in female [facility])    Right  . CAD (coronary artery disease)    Cath 2010 with DES x 1 RCA-- PT'S CARDIOLOGIST IS [name]  . Chronic diastolic congestive heart failure ([place])   . COPD (chronic obstructive pulmonary disease) ([place])   . Depression   . Diabetes mellitus DIAGNOSED IN2010   Controll s with diet  . Eczema    on back  . Hyperlipidemia   . Hypertension   . Kidney stones   . Morbid obesity with body mass index of 60.0-69.9 in adult [facility])   . Myocardial infarction ([place])    PT THINKS SHE WAS DX WITH MI AT THE TIME OF HEART STENTING  . Pulmonary embolism  ([place]) 02/08/12   S/P RT TOTAL KNEE ON 02/03/12--ON 02/08/12--DEVELOPED ACUTE SOB AND CHEST PAIN--AND DIAGNOSED WITH  PULMONARY EMBOLUS AND PNEUMONIA  . Restless leg syndrome   . Sleep apnea    uses 3 liters O2 at night   . Uterine fibroid    NO PROBLEMS AT PRESENT FROM THE FIBROIDS-STATES SHE IS POST MENOPAUSAL-LAST MENSES 2010 EXCEPT FOR EPISODE THIS YR OF BLEEDING RELATED TO FIBROIDS.  Marland Kitchen Weakness    BOTH HANDS - S/P BILATERAL CARPAL TUNNEL RELEASE--BUT STILL HAS WEAKNESS--OFTEN DROPS THINGS    Past Surgical History:  Procedure Laterality Date  . CARDIAC CATHETERIZATION    . CARPAL TUNNEL RELEASE     Bilateral  . CHOLECYSTECTOMY    . CORONARY ANGIOPLASTY     2010 has stent in place  . CYSTOSCOPY W/ RETROGRADES Right 09/21/2013   Procedure: CYSTOSCOPY WITH RIGHT RETROGRADE PYELOGRAM RIGHT DOUBLE J STENT ;  Surgeon: Fredricka Bonine, MD;  Location: [facility];  Service: Urology;  Laterality: Right;  . CYSTOSCOPY WITH URETEROSCOPY AND STENT PLACEMENT Right 10/25/2013   Procedure: CYSTOSCOPY RIGHT URETEROSCOPY HOLMIUM LASER LITHO AND STENT PLACEMENT;  Surgeon: Fredricka Bonine, MD;  Location: [facility];  Service: Urology;  Laterality: Right;  . HERNIA REPAIR    . INTRAOPERATIVE TRANSESOPHAGEAL ECHOCARDIOGRAM N/A 12/12/2014   Procedure: INTRAOPERATIVE TRANSESOPHAGEAL ECHOCARDIOGRAM;  Surgeon: [name] Blanks, MD;  Location: [facility] OR;  Service: Cardiovascular;  Laterality: N/A;  . JOINT REPLACEMENT     bil total knees  . KNEE ARTHROPLASTY  02/03/2012   Procedure: COMPUTER ASSISTED TOTAL KNEE ARTHROPLASTY;  Surgeon: Mcarthur Rossetti, MD;  Location: [place];  Service: Orthopedics;  Laterality: Right;  Right total knee arthroplasty  . LEFT AND RIGHT HEART CATHETERIZATION WITH CORONARY ANGIOGRAM N/A 03/17/2013   Procedure: LEFT AND RIGHT HEART CATHETERIZATION WITH CORONARY ANGIOGRAM;  Surgeon: [name] Blanks, MD;  Location: [facility] C[facility] LAB;  Service: Cardiovascular;  Laterality: N/A;   . LEFT AND RIGHT HEART CATHETERIZATION WITH CORONARY/GRAFT ANGIOGRAM N/A 09/14/2014   Procedure: LEFT AND RIGHT HEART CATHETERIZATION WITH Beatrix Fetters;  Surgeon: [name] Blanks, MD;  Location: [facility] CATH LAB;  Service: Cardiovascular;  Laterality: N/A;  . TEE WITHOUT CARDIOVERSION N/A 03/14/2013   Procedure: TRANSESOPHAGEAL ECHOCARDIOGRAM (TEE);  Surgeon: Lelon Perla, MD;  Location: [facility] ENDOSCOPY;  Service: Cardiovascular;  Laterality: N/A;  . TEE WITHOUT CARDIOVERSION N/A 11/14/2014   Procedure: TRANSESOPHAGEAL ECHOCARDIOGRAM (TEE);  Surgeon: Lelon Perla, MD;  Location: [facility] ENDOSCOPY;  Service: Cardiovascular;  Laterality: N/A;  . TOTAL KNEE ARTHROPLASTY  09/10/2012   Procedure: TOTAL KNEE ARTHROPLASTY;  Surgeon: Mcarthur Rossetti, MD;  [facility];  Service: Orthopedics;  Laterality: Left;  . TOTAL KNEE REVISION Right 07/15/2013   Procedure: REVISION ARTHROPLASTY RIGHT KNEE;  Surgeon: Mcarthur Rossetti, MD;  Location[facility];  Service: Orthopedics;  Laterality: Right;  . TRANSCATHETER AORTIC VALVE REPLACEMENT, TRANSFEMORAL N/A 12/12/2014   Procedure: TRANSCATHETER AORTIC VALVE REPLACEMENT, TRANSFEMORAL;  Surgeon: [name] Blanks, MD;  Location: [place];  Service: Cardiovascular;  Laterality: N/A;  . TRIGGER FINGER RELEASE  09/10/2012   Procedure: RELEASE TRIGGER FINGER/A-1 PULLEY;  Surgeon: Mcarthur Rossetti, MD;  Location: [facility];  Service: Orthopedics;  Laterality: Right;  Right Ring Finger  . TUBAL LIGATION      MEDICATIONS: . albuterol (PROVENTIL HFA;VENTOLIN HFA) 108 (90 Base) MCG/ACT inhaler  . albuterol (PROVENTIL) (2.5 MG/3ML) 0.083% nebulizer solution  . Alirocumab (PRALUENT) 75 MG/ML SOPN  . aspirin EC 81 MG tablet  . atorvastatin (LIPITOR) 80 MG tablet  . Blood Glucose Monitoring Suppl (RELION CONFIRM GLUCOSE MONITOR) w/Device KIT  . clopidogrel (PLAVIX) 75 MG tablet  . Diclofenac Sodium 3 % CREA  . DULoxetine (CYMBALTA) 30 MG  capsule  . ezetimibe (ZETIA) 10 MG tablet  . Fluticasone-Umeclidin-Vilant (TRELEGY ELLIPTA) 100-62.5-25 MCG/INH AEPB  . furosemide (LASIX) 40 MG tablet  . gabapentin (NEURONTIN) 300 MG capsule  . glucose blood (RELION GLUCOSE TEST STRIPS) test strip  . ibuprofen (ADVIL,MOTRIN) 200 MG tablet  . losartan (COZAAR) 100 MG tablet  . megestrol (MEGACE) 40 MG tablet  . meloxicam (MOBIC) 15 MG tablet  . metoprolol tartrate (LOPRESSOR) 25 MG tablet  . nitroGLYCERIN (NITROSTAT) 0.4 MG SL tablet  . nystatin (MYCOSTATIN) 100000 UNIT/ML suspension  . OXYGEN  . potassium chloride SA (K-DUR,KLOR-CON) 20 MEQ tablet  . Tetrahydrozoline HCl (VISINE OP)   No current facility-administered medications for this encounter.    [name] Caldwell, PA-C  [facility] Short Stay Center/Anesthesiology Phone [phone] 07/05/2018 11:06 AM

## 2018-07-06 ENCOUNTER — Telehealth: Payer: Self-pay | Admitting: Internal Medicine

## 2018-07-06 NOTE — Telephone Encounter (Addendum)
Relion meter and strips sent to [facility] on 07/02/2018. Hubbard Hartshorn, RN, BSN

## 2018-07-06 NOTE — Telephone Encounter (Signed)
Needs a prescription for a meter and test strips.  Pt said that was discuss with physician on last visit- asked pharmacy no prescription- @ [place]

## 2018-07-07 ENCOUNTER — Other Ambulatory Visit: Payer: Self-pay

## 2018-07-07 ENCOUNTER — Ambulatory Visit (INDEPENDENT_AMBULATORY_CARE_PROVIDER_SITE_OTHER): Payer: Medicaid Other | Admitting: Internal Medicine

## 2018-07-07 ENCOUNTER — Other Ambulatory Visit: Payer: Self-pay | Admitting: Internal Medicine

## 2018-07-07 ENCOUNTER — Telehealth: Payer: Self-pay | Admitting: *Deleted

## 2018-07-07 VITALS — BP 135/60 | HR 74 | Temp 98.2°F | Ht 60.0 in | Wt 346.7 lb

## 2018-07-07 DIAGNOSIS — I350 Nonrheumatic aortic (valve) stenosis: Secondary | ICD-10-CM | POA: Diagnosis not present

## 2018-07-07 DIAGNOSIS — I10 Essential (primary) hypertension: Secondary | ICD-10-CM

## 2018-07-07 DIAGNOSIS — E118 Type 2 diabetes mellitus with unspecified complications: Secondary | ICD-10-CM | POA: Diagnosis not present

## 2018-07-07 DIAGNOSIS — Z171 Estrogen receptor negative status [ER-]: Secondary | ICD-10-CM

## 2018-07-07 DIAGNOSIS — Z7901 Long term (current) use of anticoagulants: Secondary | ICD-10-CM

## 2018-07-07 DIAGNOSIS — G4733 Obstructive sleep apnea (adult) (pediatric): Secondary | ICD-10-CM

## 2018-07-07 DIAGNOSIS — C50919 Malignant neoplasm of unspecified site of unspecified female breast: Secondary | ICD-10-CM

## 2018-07-07 DIAGNOSIS — E785 Hyperlipidemia, unspecified: Secondary | ICD-10-CM

## 2018-07-07 DIAGNOSIS — Z6841 Body Mass Index (BMI) 40.0 and over, adult: Secondary | ICD-10-CM | POA: Diagnosis not present

## 2018-07-07 DIAGNOSIS — J449 Chronic obstructive pulmonary disease, unspecified: Secondary | ICD-10-CM

## 2018-07-07 DIAGNOSIS — I2510 Atherosclerotic heart disease of native coronary artery without angina pectoris: Secondary | ICD-10-CM | POA: Diagnosis not present

## 2018-07-07 DIAGNOSIS — K219 Gastro-esophageal reflux disease without esophagitis: Secondary | ICD-10-CM

## 2018-07-07 DIAGNOSIS — Z952 Presence of prosthetic heart valve: Secondary | ICD-10-CM | POA: Diagnosis not present

## 2018-07-07 DIAGNOSIS — Z955 Presence of coronary angioplasty implant and graft: Secondary | ICD-10-CM

## 2018-07-07 DIAGNOSIS — E119 Type 2 diabetes mellitus without complications: Secondary | ICD-10-CM

## 2018-07-07 LAB — GLUCOSE, CAPILLARY: Glucose-Capillary: 274 mg/dL — ABNORMAL HIGH (ref 70–99)

## 2018-07-07 MED ORDER — ACCU-CHEK AVIVA PLUS W/DEVICE KIT: 1.0000 [IU] | PACK | Freq: Four times a day (QID) | 0 refills | Status: DC

## 2018-07-07 MED ORDER — ACCU-CHEK SOFT TOUCH LANCETS MISC: 12 refills | Status: DC

## 2018-07-07 MED ORDER — GLUCOSE BLOOD VI STRP: ORAL_STRIP | 12 refills | Status: DC

## 2018-07-07 MED ORDER — ACCU-CHEK AVIVA CONNECT W/DEVICE KIT: 1.0000 | PACK | Freq: Two times a day (BID) | 0 refills | Status: DC

## 2018-07-07 MED ORDER — METFORMIN HCL ER 500 MG PO TB24: 500.0000 mg | ORAL_TABLET | Freq: Every day | ORAL | Status: DC

## 2018-07-07 MED ORDER — RELION CONFIRM GLUCOSE MONITOR W/DEVICE KIT: 1.0000 [IU] | PACK | Freq: Three times a day (TID) | 0 refills | Status: DC

## 2018-07-07 NOTE — Patient Instructions (Signed)
It was nice seeing you today. Thank you for choosing [facility] for your Primary Care.   Today we talked about:  1) Diabetes: After your breast surgery, please start taking Metformin XR 500mg  per the titration sheet. If you start having diarrhea or other GI side effects, do not increase the dose. Pick up your glucose meter and test strips and start checking your sugar every morning before breakfast and then again sometime in the afternoon. Please bring your glucose meter to your next appointment so we can view your sugars.  Hope you breast surgery goes well!   FOLLOW-UP INSTRUCTIONS When: 3 weeks For: Diabetes  What to bring: glucose meter   Please contact the clinic if you have any problems, or need to be seen sooner.   Thank you,   Dr. Donne Hazel

## 2018-07-07 NOTE — Progress Notes (Signed)
   CC: elevated blood sugar  HPI:  [name] is a 57 y.o. female with PMH of severe aortic stenosis s/p TAVR, CAD, OSA, HTN, HLD, obesity, GERD, asthma, copd, diabetes, breast cancer who presents for evaluation of increased blood sugar. She is scheduled for a breast lumpectomy and pre-op labs found random glucose 346 and a1c 9.1. Diagnosed with diabetes in the past, but patient reports discontinuing glipizide "awhile ago" and denies every taking metformin. Does not have a home glucometer. Endorses occasional h/a, nausea, blurry vision which improves with food intake. Also endorses polydipsia and polyuria. Denies diarrhea, constipation, vomiting.   Past Medical History:  Diagnosis Date  . Anemia   . Aortic valve stenosis, severe   . 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 Asthma   . Blood transfusion    2013[facility]  . Breast cancer in female [facility])    Right  . CAD (coronary artery disease)    Cath 2010 with DES x 1 RCA-- PT'S CARDIOLOGIST IS DR. [place]  . Chronic diastolic congestive heart failure ([place])   . COPD (chronic obstructive pulmonary disease) ([place])   . Depression   . Diabetes mellitus DIAGNOSED IN2010   Controll s with diet  . Eczema    on back  . Hyperlipidemia   . Hypertension   . Kidney stones   . Morbid obesity with body mass index of 60.0-69.9 in adult [facility])   . Myocardial infarction ([place])    PT THINKS SHE WAS DX WITH MI AT THE TIME OF HEART STENTING  . Pulmonary embolism ([place]) 02/08/12   S/P RT TOTAL KNEE ON 02/03/12--ON 02/08/12--DEVELOPED ACUTE SOB AND CHEST PAIN--AND DIAGNOSED WITH  PULMONARY EMBOLUS AND PNEUMONIA  . Restless leg syndrome   . Sleep apnea    uses 3 liters O2 at night   . Uterine fibroid    NO PROBLEMS AT PRESENT FROM THE FIBROIDS-STATES SHE IS POST MENOPAUSAL-LAST MENSES 2010 EXCEPT FOR EPISODE THIS YR OF BLEEDING RELATED TO  FIBROIDS.  Marland Kitchen Weakness    BOTH HANDS - S/P BILATERAL CARPAL TUNNEL RELEASE--BUT STILL HAS WEAKNESS--OFTEN DROPS THINGS    Physical Exam:  There were no vitals filed for this visit. Gen: Well appearing, NAD CV: RRR, no murmurs Pulm: Normal effort, CTA throughout, no wheezing Ext: Warm, no edema, normal joints, sensation intact bilaterally  Skin:  No rashes, wounds, or ulcers   Assessment & Plan:   See Encounters Tab for problem based charting.  Patient seen with Dr. Beryle Beams

## 2018-07-07 NOTE — Telephone Encounter (Signed)
Confirmed with pharmacy that they received Rx for meter and strips. Patient would have to pay out of pocket as patient has no insurance. Returned call to patient. States she received medicaid letter yesterday. Sched for surgery on 07/09/2018, however, pre-op testing showed glucose in 300s and A1C 9. All diabetes meds were stopped awhile ago. Appt given in [facility] today to evaluate need to restart meds. Hubbard Hartshorn, RN, BSN

## 2018-07-07 NOTE — Progress Notes (Signed)
Medicine attending: I personally interviewed and briefly examined this patient on the day of the patient visit and reviewed pertinent clinical laboratory, and radiographic data  with resident physician Dr. Francesco Runner and we discussed a management plan. 57 y/o woman, morbid obesity, CAD S/P stent, aortic valvular dis S/P TAVR, COPD, OSA, just dxd w stage I, strongly ER/PR positive, HER-2 negative breast cancer, scheduled for lumpectomy 2 days from now. Poorly controlled DMII not taking any meds. Not enough lead time to get control of glucose in time for planned surgery. Dr. Donne Hazel discussed w anesthesiology PA. They will give insulin if needed to cover peri-op sugars. We will then start her on glucophage and titrate dose. Short interim clinic follow up.

## 2018-07-07 NOTE — Assessment & Plan Note (Addendum)
Uncontrolled, a1c 9.1. Not on medication. Denies neuropathy. Concerns for symptomatic hypoglycemia. Issues getting meter but medicaid has now been approved. Breast lumpectomy scheduled for 7/12.   Plan: - Recommend intraoperative insulin drip per anesthesia  - after breast surgery, START metformin XR 500mg  daily, given titration instructions - check sugars daily before breakfast and again in the afternoon - counseled on weight loss, diet, and blood pressure control -  F/u in 3 weeks, consider starting Victoza given cardiac disease and need for weight loss - Referral sent for diabetic eye exam  - MNT referral placed

## 2018-07-07 NOTE — Telephone Encounter (Signed)
Fax from [employer] - pt has Medicaid; which covers Accuchek Aviva Plus meter, test strips and soft clix lancets. Please send new rx . Thanks

## 2018-07-08 ENCOUNTER — Inpatient Hospital Stay: Admission: RE | Admit: 2018-07-08 | Payer: Self-pay | Source: Ambulatory Visit

## 2018-07-08 MED ORDER — DEXTROSE 5 % IV SOLN
3.0000 g | Freq: Once | INTRAVENOUS | Status: DC
  Filled 2018-07-08: qty 3000

## 2018-07-09 ENCOUNTER — Ambulatory Visit
Admission: RE | Admit: 2018-07-09 | Discharge: 2018-07-09 | Disposition: A | Discharge status: Home / Self Care | Payer: Medicaid Other | Source: Ambulatory Visit | Attending: Surgery | Admitting: Surgery

## 2018-07-09 ENCOUNTER — Encounter (HOSPITAL_COMMUNITY): Payer: Medicaid Other

## 2018-07-09 ENCOUNTER — Ambulatory Visit (HOSPITAL_COMMUNITY): Admission: RE | Admit: 2018-07-09 | Payer: Medicaid Other | Source: Ambulatory Visit | Admitting: Surgery

## 2018-07-09 ENCOUNTER — Encounter (HOSPITAL_COMMUNITY): Admission: RE | Payer: Self-pay | Source: Ambulatory Visit

## 2018-07-09 DIAGNOSIS — Z170 Estrogen receptor positive status [ER+]: Principal | ICD-10-CM

## 2018-07-09 DIAGNOSIS — C50911 Malignant neoplasm of unspecified site of right female breast: Secondary | ICD-10-CM

## 2018-07-09 SURGERY — BREAST LUMPECTOMY WITH RADIOACTIVE SEED AND SENTINEL LYMPH NODE BIOPSY
Anesthesia: General | Site: Breast | Laterality: Right

## 2018-07-12 ENCOUNTER — Other Ambulatory Visit: Payer: Self-pay | Admitting: Internal Medicine

## 2018-07-12 MED ORDER — GLUCOSE BLOOD VI STRP: ORAL_STRIP | 1 refills | Status: DC

## 2018-07-12 NOTE — Telephone Encounter (Signed)
Pt having issues getting test strips, the pharmacy said the prescription isn't clear on [place], pt contact [phone]

## 2018-07-12 NOTE — Telephone Encounter (Signed)
Rxs sent 7/10 per Dr Amie Portland.

## 2018-07-13 ENCOUNTER — Telehealth: Payer: Self-pay | Admitting: Internal Medicine

## 2018-07-13 DIAGNOSIS — E119 Type 2 diabetes mellitus without complications: Secondary | ICD-10-CM

## 2018-07-13 MED ORDER — GLUCOSE BLOOD VI STRP: ORAL_STRIP | 1 refills | Status: DC

## 2018-07-13 NOTE — Telephone Encounter (Signed)
I am sending it to [facility].

## 2018-07-13 NOTE — Telephone Encounter (Signed)
Needs a prescription on glucose meter/test strips- asked pharmacy no order was sent @ [facility]; pt contact # [phone]- Pt is requesting a call ASAP

## 2018-07-13 NOTE — Telephone Encounter (Signed)
Pt would like the office to contact [facility], and let them know how often the pt is checking blood sugar. Pt would like a call back.

## 2018-07-13 NOTE — Telephone Encounter (Signed)
Rx was sent to [facility] instead of [facility]. Please re-send. Thanks

## 2018-07-14 ENCOUNTER — Other Ambulatory Visit: Payer: Self-pay

## 2018-07-14 ENCOUNTER — Encounter (HOSPITAL_COMMUNITY): Payer: Self-pay | Admitting: *Deleted

## 2018-07-14 NOTE — Telephone Encounter (Signed)
Patient notified that strips were sent last evening. Very appreciative. Hubbard Hartshorn, RN, BSN

## 2018-07-14 NOTE — Progress Notes (Signed)
Spoke with pt for pre-op call. Pt had a PAT appt on 7/5 and surgery had to be rescheduled due to OR issues. Pt states her fasting blood sugars are running in the 200's, today it was 277. Pt's A1C was 9.1 on 07/02/18. Pt is now taking Metformin. She states her machine and her strips are old, strips went out of date in 2017. She cannot afford new strips and right now Medicaid is refusing to pay for them. She verified that nothing else has changed with her allergies, medical and surgical history. I instructed pt to follow all the instructions that she was given on 07/02/18 and instructed her not to take her Metformin the morning of surgery. Pt will drink Ensure pre-surgery by 4:30 AM Monday.

## 2018-07-16 ENCOUNTER — Ambulatory Visit
Admission: RE | Admit: 2018-07-16 | Discharge: 2018-07-16 | Disposition: A | Discharge status: Home / Self Care | Payer: Medicaid Other | Source: Ambulatory Visit | Attending: Surgery | Admitting: Surgery

## 2018-07-16 ENCOUNTER — Ambulatory Visit (INDEPENDENT_AMBULATORY_CARE_PROVIDER_SITE_OTHER): Payer: Medicaid Other | Admitting: Dietician

## 2018-07-16 ENCOUNTER — Ambulatory Visit (INDEPENDENT_AMBULATORY_CARE_PROVIDER_SITE_OTHER): Payer: Medicaid Other | Admitting: Internal Medicine

## 2018-07-16 VITALS — BP 140/76 | HR 66 | Temp 97.9°F | Wt 346.3 lb

## 2018-07-16 DIAGNOSIS — Z7984 Long term (current) use of oral hypoglycemic drugs: Secondary | ICD-10-CM

## 2018-07-16 DIAGNOSIS — Z713 Dietary counseling and surveillance: Secondary | ICD-10-CM | POA: Diagnosis not present

## 2018-07-16 DIAGNOSIS — J449 Chronic obstructive pulmonary disease, unspecified: Secondary | ICD-10-CM

## 2018-07-16 DIAGNOSIS — Z952 Presence of prosthetic heart valve: Secondary | ICD-10-CM | POA: Diagnosis not present

## 2018-07-16 DIAGNOSIS — Z6841 Body Mass Index (BMI) 40.0 and over, adult: Secondary | ICD-10-CM

## 2018-07-16 DIAGNOSIS — I2510 Atherosclerotic heart disease of native coronary artery without angina pectoris: Secondary | ICD-10-CM

## 2018-07-16 DIAGNOSIS — G4733 Obstructive sleep apnea (adult) (pediatric): Secondary | ICD-10-CM

## 2018-07-16 DIAGNOSIS — Z170 Estrogen receptor positive status [ER+]: Secondary | ICD-10-CM

## 2018-07-16 DIAGNOSIS — C50919 Malignant neoplasm of unspecified site of unspecified female breast: Secondary | ICD-10-CM | POA: Diagnosis not present

## 2018-07-16 DIAGNOSIS — E118 Type 2 diabetes mellitus with unspecified complications: Secondary | ICD-10-CM | POA: Diagnosis not present

## 2018-07-16 DIAGNOSIS — E119 Type 2 diabetes mellitus without complications: Secondary | ICD-10-CM

## 2018-07-16 DIAGNOSIS — Z955 Presence of coronary angioplasty implant and graft: Secondary | ICD-10-CM

## 2018-07-16 DIAGNOSIS — C50911 Malignant neoplasm of unspecified site of right female breast: Secondary | ICD-10-CM

## 2018-07-16 MED ORDER — PEN NEEDLES 32G X 4 MM MISC: 1.0000 | Freq: Every day | 1 refills | Status: DC

## 2018-07-16 MED ORDER — LIRAGLUTIDE 18 MG/3ML ~~LOC~~ SOPN: PEN_INJECTOR | SUBCUTANEOUS | 5 refills | Status: DC

## 2018-07-16 NOTE — Patient Instructions (Signed)
  Ms. Stankiewicz,   It was very good meeting with you!  I recommend we follow up in 2-4 weeks.   I have givine you a handout about diabetes medicines.   Eating about the same amount of carbs at meals and snacks each day should help control your blood sugars. Eating about 4-5  carb choices at each meal and 1-2 carbs for snacks may work for you   Examples:  Breakfast: 2 slices bread + 1 fruit + 1 dairy = 4 carb choices     snack: 1 starch      Lunch: 2 starch + 2 milk = 4 carb choices     Snack: 1 milk + 1 fruit     Dinner: 2 starch + 2 fruit = 4 carb choices     Snack: 2 starches   Veggies, healthy fats and proteins (meats) do not count- add them as needed.    1 carb choice is: 8 oz milk, 8 oz plain or lite yogurt, 1 small piece of fruit, 1/2 cup fruit, 1 slice bread, 1/2 cup corn, 1/2 cup pasta, 1/2 cup potatoes, 1/2 cup hot cereal like grits or oatmeal, 1-2 cups non starchy vegetables like cabbage, broccoli, green beans   Here are some videos that I recommend that might help reinforce what we talked about today:   1- DigitalBedroom.se  2-https://www.webmd.com/diabetes/blood-sugar-coach-17/diabetes-diet/video-diabetes-nutrition-label  3-https://www.webmd.com/diabetes/type2-heart-disease-18/video-diabetes-heart-foods

## 2018-07-16 NOTE — Progress Notes (Signed)
   CC: diabetes  HPI:  [name] is a 57 y.o. woman, morbid obesity, CAD S/P stent, aortic valvular dis S/P TAVR, COPD, OSA, just dxd w stage I, strongly ER/PR positive, HER-2 negative breast cancer, scheduled for lumpectomy 7/22. Presents today for diabetes follow up after starting metformin. She reports decent tolerance of metformin. She has noted some soft stools, but no overt diarrhea. Reports taking metformin with food. Had an issue getting lancets, this has since been resolved. Checks sugars in the morning and one other time in the afternoon. Denies symptoms of low blood sugar such as headache, diaphoresis, nausea, blurry vision. States she is very motivated to make diet changes. Her biggest problem is overeating.    Past Medical History:  Diagnosis Date  . Anemia   . Aortic valve stenosis, severe   . 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 Asthma   . Blood transfusion    [facility]  . Breast cancer in female [facility])    Right  . CAD (coronary artery disease)    Cath 2010 with DES x 1 RCA-- PT'S CARDIOLOGIST IS DR. Holton  . Chronic diastolic congestive heart failure ([place])   . COPD (chronic obstructive pulmonary disease) ([place])   . Depression   . Diabetes mellitus DIAGNOSED IN2010   Controll s with diet  . Eczema    on back  . Hyperlipidemia   . Hypertension   . Kidney stones   . Morbid obesity with body mass index of 60.0-69.9 in adult [facility])   . Myocardial infarction ([place])    PT THINKS SHE WAS DX WITH MI AT THE TIME OF HEART STENTING  . Pulmonary embolism ([place]) 02/08/12   S/P RT TOTAL KNEE ON 02/03/12--ON 02/08/12--DEVELOPED ACUTE SOB AND CHEST PAIN--AND DIAGNOSED WITH  PULMONARY EMBOLUS AND PNEUMONIA  . Restless leg syndrome   . Sleep apnea    uses 3 liters O2 at night   . Uterine fibroid    NO PROBLEMS AT PRESENT FROM THE FIBROIDS-STATES SHE IS POST  MENOPAUSAL-LAST MENSES 2010 EXCEPT FOR EPISODE THIS YR OF BLEEDING RELATED TO FIBROIDS.  Marland Kitchen Weakness    BOTH HANDS - S/P BILATERAL CARPAL TUNNEL RELEASE--BUT STILL HAS WEAKNESS--OFTEN DROPS THINGS   Review of Systems:   General: denies fevers, chills GI: denies diarrhea, abdominal pain   Physical Exam:  Vitals:   07/16/18 1017  BP: 140/76  Pulse: 66  Temp: 97.9 F (36.6 C)  TempSrc: Oral  SpO2: 98%  Weight: (!) 346 lb 4.8 oz (157.1 kg)   Gen: Obese, sitting in chair on oxygen CV: distant heart sounds due to body habitus but RRR, no murmurs Pulm: Normal effort, CTA throughout, no wheezing Ext: Warm, no edema, normal joints   Assessment & Plan:   See Encounters Tab for problem based charting.  Patient seen with Dr. Daryll Drown

## 2018-07-16 NOTE — Assessment & Plan Note (Addendum)
Chronic. Uncontrolled. Started metformin XR. Currently taking 500mg  BID. Stools are softer but no diarrhea. Checking sugars occasionally. High 348. Low 159. Average in the 200s. Denies symptomatic hypoglycemia.  Plan: - Continue metformin, titrate up per instructions. - START Victoza 0.6 mg for 7 days, then increase to 1.2mg .  -  Breast surgery postponed to 7/22. If needed, recommend intraoperative insulin drop per anesthesia - Continue diet modifications - f/u 2-4 weeks with physician and Butch Penny

## 2018-07-16 NOTE — Patient Instructions (Addendum)
It was nice seeing you today. Thank you for choosing [facility] for your Primary Care.   Today we talked about:  1) Diabetes: Make sure you take Metformin with food to help prevent diarrhea. After your surgery, start taking Victoza once a day. For the first 7 days you will inject 0.1 mLs (0.6mg  total). After the first 7 days, start injecting 0.19mLs (1.2mg  total)  Continue checking sugars in the morning before breakfast and at least one other time in the afternoon.   FOLLOW-UP INSTRUCTIONS When: 2-4 weeks For: diabetes, appt with Butch Penny and the physcian  Please contact the clinic if you have any problems, or need to be seen sooner.

## 2018-07-18 NOTE — Anesthesia Preprocedure Evaluation (Addendum)
Anesthesia Evaluation  Patient identified by MRN, date of birth, ID band  Reviewed: Allergy & Precautions, H&P , NPO status , Patient's Chart, lab work & pertinent test results  History of Anesthesia Complications Negative for: history of anesthetic complications  Airway Mallampati: III  TM Distance: <3 FB Neck ROM: Full    Dental  (+) Partial Upper, Partial Lower, Chipped, Dental Advisory Given,    Pulmonary shortness of breath, with exertion and Long-Term Oxygen Therapy, asthma , sleep apnea and Oxygen sleep apnea , COPD,  COPD inhaler and oxygen dependent, former smoker,    Pulmonary exam normal breath sounds clear to auscultation       Cardiovascular hypertension, Pt. on home beta blockers + CAD, + Past MI and +CHF  + Valvular Problems/Murmurs AS  Rhythm:Regular Rate:Normal + Systolic murmurs Echo   Study Conclusions  - Left ventricle: The cavity size was normal. There was mild   concentric hypertrophy. Systolic function was vigorous. The   estimated ejection fraction was in the range of 65% to 70%. Wall   motion was normal; there were no regional wall motion   abnormalities. Doppler parameters are consistent with abnormal   left ventricular relaxation (grade 1 diastolic dysfunction).   Doppler parameters are consistent with elevated ventricular   end-diastolic filling pressure. - Aortic valve: A bioprosthetic Edward-SAPIEN valve is seated well   in the artic position, leaflets are poorly visualized.   There is trivial aortic regurgitation. Transaortic gradients are   elevated with mean 30 mmHg, peak gradient 42 mmHg.    Neuro/Psych  Headaches, PSYCHIATRIC DISORDERS Depression    GI/Hepatic GERD  Medicated and Controlled,  Endo/Other  diabetes, Type 2  Renal/GU      Musculoskeletal  (+) Arthritis ,   Abdominal   Peds  Hematology  (+) anemia ,   Anesthesia Other Findings   Reproductive/Obstetrics                             Anesthesia Physical  Anesthesia Plan  ASA: IV  Anesthesia Plan: General   Post-op Pain Management:    Induction: Intravenous  PONV Risk Score and Plan: 3 and Ondansetron, Treatment may vary due to age or medical condition, Midazolam and Scopolamine patch - Pre-op  Airway Management Planned: Oral ETT and LMA  Additional Equipment:   Intra-op Plan:   Post-operative Plan: Extubation in OR and Possible Post-op intubation/ventilation  Informed Consent: I have reviewed the patients History and Physical, chart, labs and discussed the procedure including the risks, benefits and alternatives for the proposed anesthesia with the patient or authorized representative who has indicated his/her understanding and acceptance.   Dental advisory given  Plan Discussed with: CRNA, Surgeon and Anesthesiologist  Anesthesia Plan Comments: (  )        Anesthesia Quick Evaluation

## 2018-07-18 NOTE — H&P (Signed)
[name]  Location: [facility] Patient #: [id number] DOB: [birth year] Undefined / Language: Cleophus Molt / Race: White Female  History of Present Illness   The patient is a 57 year old female who presents with a complaint of breast cancer.  The PCP is Dr. Lorella [name]  The patient was referred by Dr. Curlene [name]  Cardiology - Dr. Estevan [name]  The pateint is at the [facility] - Oncology is Drs. [name] and [name] She is accompanied by her husband (who is skinny).   We tried to do the surgery a couple of weeks ago, but she came to the hospital with a blood surgery 350+.   She sees the Medical Service at [facility] for her PCP and I have spoken to [name] about her.  [name] PCP is a resident, Dr. Reesa [name].  Her last mammogram was about 5 years ago. She did not feel a mass in her breast. She has had no prior history of breast biopsy. She is not on hormone therapy. Her mother had breast cancerat about 23 years of age. But her mother left her she was little and she does not have exact details of her mother's medical history.  [facility] showed right breast distortion. Density "b". 1.8 cm mass in the 11 o'clock position. Biopsy: right breast biopsy, 11 o'clock, 06/04/2018 ([name]) - IDC, grade 1, ER - 100%, PR - 100%, Ki67 - 8%, Her2 - negative Family history of breast or ovarian cancer: Mother had breast cancer On hormone therapy: No  I discussed the options for breast cancer treatment with the patient. The patient is at the [name], which includes medical oncology and radiation oncology. I discussed the surgical options of lumpectomy vs. mastectomy. If mastectomy, there is the possibility of reconstruction. I discussed the options of lymph node biopsy. The treatment plan depends on the pathologic staging of the tumor and the patient's personal wishes. The risks of  surgery include, but are not limited to, bleeding, infection, the need for further surgery, and nerve injury. The patient has been given literature on the treatment of breast cancer. She was vascillating between lumpectomy and right mastectomy. We will review the options after her cardiac clearance.  Plan: 1) Needs cardiac clearance, 2) Right lumpectomy (seed) (though she is considering a right mastectomy - we will talk after her cardicac clearance) with right axillary SLNBx, 3) Rad tx, 4) Antiestrogen  Past Medical History: 1. Morbid obesity Weight - 342, BMI - 66.8 2. History of depression 3. DM, type 2 Controlling with diet 4. HTN 5. Plavix 7. COPD/asthma/bronchitis On O2 - followed by her PCP She quit smoking about 20 years ago 8. History of bilateral knee replacement 9 C2-C3 disk surgery - Pool - 10/02/2003 10. History of aortic heart valve replacement [name] - 12/12/2014 11. MI in 2008 12. She said that she had an abdominal wall hernia repaired (2001), but it has failed. 13. History of kidney stones - has seen [name] - 10/25/2013 14. Restless leg syndrome 15. PE in 2016 - she says this was due to her knee surgery 16. She was on disability from 2005 - 2017. But she came off when her husband went on it. 17. History of lap chole 2000 18. On Megestrol from Dr. Carolyne [name] at [name] History: Married.  2 sons: [name] - 62 yo, and [name] - 59 yo. On chronic disability  Past Surgical History [name] Spillers [place],  RN; 06/16/2018 7:38 AM) Breast Biopsy  Right. Gallbladder Surgery - Laparoscopic  Knee Surgery  Bilateral. Ventral / Umbilical Hernia Surgery  Left.  Diagnostic Studies History [name] Pummel, RN; 06/16/2018 7:38 AM) Colonoscopy  1-5 years ago Mammogram  within last year Pap Smear  >5 years ago  Medication History [name] Pummel, RN; 06/16/2018  7:39 AM) Medications Reconciled  Social History [name] Pummel, RN; 06/16/2018 7:38 AM) Alcohol use  Occasional alcohol use. Caffeine use  Carbonated beverages, Coffee, Tea. Illicit drug use  Remotely quit drug use. Tobacco use  Former smoker.  Family History [name] Pummel, RN; 06/16/2018 7:38 AM) Alcohol Abuse  Father, Mother. Heart Disease  Father, Mother. Heart disease in female family member before age 43  Heart disease in female family member before age 72  Kidney Disease  Father. Prostate Cancer  Family Members In General.  Pregnancy / Birth History [name] Pummel, RN; 06/16/2018 7:38 AM) Age at menarche  89 years. Age of menopause  51-55 Contraceptive History  Oral contraceptives. Gravida  3 Maternal age  68-30 Para  2 Regular periods   Other Problems [name] Pummel, RN; 06/16/2018 7:38 AM) Asthma  Back Pain  Cholelithiasis  Chronic Obstructive Lung Disease  Gastroesophageal Reflux Disease  Heart murmur  High blood pressure  Home Oxygen Use  Hypercholesterolemia  Kidney Stone  Pulmonary Embolism / Blood Clot in Legs  Transfusion history  Umbilical Hernia Repair    Review of Systems [name] Spillers [place] RN; 06/16/2018 7:38 AM) General Not Present- Appetite Loss, Chills, Fatigue, Fever, Night Sweats, Weight Gain and Weight Loss. Skin Present- Dryness. Not Present- Change in Wart/Mole, Hives, Jaundice, New Lesions, Non-Healing Wounds, Rash and Ulcer. HEENT Not Present- Earache, Hearing Loss, Hoarseness, Nose Bleed, Oral Ulcers, Ringing in the Ears, Seasonal Allergies, Sinus Pain, Sore Throat, Visual Disturbances, Wears glasses/contact lenses and Yellow Eyes. Respiratory Present- Chronic Cough and Wheezing. Not Present- Bloody sputum, Difficulty Breathing and Snoring. Breast Present- Breast Mass and Breast Pain. Not Present- Nipple Discharge and Skin Changes. Cardiovascular Present- Difficulty Breathing Lying Down, Leg Cramps and Swelling of  Extremities. Not Present- Chest Pain, Palpitations, Rapid Heart Rate and Shortness of Breath. Gastrointestinal Not Present- Abdominal Pain, Bloating, Bloody Stool, Change in Bowel Habits, Chronic diarrhea, Constipation, Difficulty Swallowing, Excessive gas, Gets full quickly at meals, Hemorrhoids, Indigestion, Nausea, Rectal Pain and Vomiting. Female Genitourinary Present- Nocturia. Not Present- Frequency, Painful Urination, Pelvic Pain and Urgency. Musculoskeletal Present- Back Pain. Not Present- Joint Pain, Joint Stiffness, Muscle Pain, Muscle Weakness and Swelling of Extremities. Neurological Present- Trouble walking. Not Present- Decreased Memory, Fainting, Headaches, Numbness, Seizures, Tingling, Tremor and Weakness. Psychiatric Present- Depression. Not Present- Anxiety, Bipolar, Change in Sleep Pattern, Fearful and Frequent crying. Hematology Present- Blood Thinners. Not Present- Easy Bruising, Excessive bleeding, Gland problems, HIV and Persistent Infections.   Physical Exam  General: Morbidly obese WFalert. She needs to loose weight and we discussed this. She is on nasal O2. Skin: Inspection and palpation of the skin unremarkable.  Eyes: Conjunctivae white, pupils equal. Face, ears, nose, mouth, and throat: Face - normal. Normal ears and nose. Lips and teeth normal.  Neck: Supple. No mass. Trachea midline. No thyroid mass.  Lymph Nodes: No supraclavicular or cervical adenopathy. No axillary adenopathy.  Lungs: Normal respiratory effort. Clear to auscultation and symmetric breath sounds. Cardiovascular: Regular rate and rythm. Normal auscultation of the heart. No murmur or rub.  Breasts: Right - Tender at 10:30 o'clock, but I don't feel a mass. She has a slight bruise at the biopsy  site (10:30). Left - No mass or nodule.  Abdomen: Liver and spleen not palpable. No tenderness. Normal bowel sounds.   She has a massiv abdomen - which limits the  examination. Rectal: Not done.  Musculoskeletal/extremities: Normal gait. Good strength and [name] in upper and lower extremities.   Neurologic: Grossly intact to motor and sensory function.   Psychiatric: Has normal mood and affect. Judgement and insight appear normal.  Assessment & Plan  1.  MALIGNANT NEOPLASM OF RIGHT BREAST, STAGE 1, ESTROGEN RECEPTOR POSITIVE (C50.911)  Story: Biopsy: right breast biopsy, 11 o'clock, 06/04/2018 ([id number]) - IDC, grade 1, ER - 100%, PR - 100%, Ki67 - 8%, Her2 - negative  Oncology - [name] and [place]  Plan:  1) Needs cardiac clearance, Addendum Note(Sarafina Puthoff H. Lucia Gaskins MD; 06/18/2018 3:28 PM) cardiac clearance by Daune Perch, NP.   2) Right lumpectomy (seed) with right axillary SLNBx,  3) Rad tx,  4) Antiestrogen  2.  ANTICOAGULATED (Z79.01)  Impression: On plavix  She is to hold for 5 days pre op  3.  OBESITY, MORBID, BMI 50 OR HIGHER (E66.01)  Weight - 342, BMI - 66.8  4.  DIABETES MELLITUS TYPE 2 IN OBESE (E11.69)  Has been poorly controlled.  Medicine service is working with her to control her blood sugars. 5.  History of depression  6. HTN 7. COPD/asthma/bronchitis On O2 - followed by her PCP She quit smoking about 20 years ago 8 C2-C3 disk surgery - Pool - 10/02/2003 9. History of aortic heart valve replacement  [name] - 12/12/2014 10. MI in 2008 11. She said that she had an abdominal wall hernia repaired (2001), but it has failed. 12. History of kidney stones - has seen [name] - 10/25/2013 13. Restless leg syndrome 14. PE in 2016 - she says this was due to her knee surgery 15. She was on disability from 2005 - 2017. But she came off when her husband went on it. 16. On Megestrol from Dr. Carolyne [name] at [place], MD, [facility] Pager: [phone] Office phone:  [phone]

## 2018-07-19 ENCOUNTER — Encounter (HOSPITAL_COMMUNITY): Payer: Self-pay | Admitting: Critical Care Medicine

## 2018-07-19 ENCOUNTER — Other Ambulatory Visit: Payer: Self-pay

## 2018-07-19 ENCOUNTER — Other Ambulatory Visit: Payer: Self-pay | Admitting: *Deleted

## 2018-07-19 ENCOUNTER — Ambulatory Visit
Admission: RE | Admit: 2018-07-19 | Discharge: 2018-07-19 | Disposition: A | Discharge status: Home / Self Care | Payer: Medicaid Other | Source: Ambulatory Visit | Attending: Surgery | Admitting: Surgery

## 2018-07-19 ENCOUNTER — Encounter (HOSPITAL_COMMUNITY)
Admission: RE | Disposition: A | Discharge status: Home / Self Care | Payer: Self-pay | Source: Ambulatory Visit | Attending: Surgery

## 2018-07-19 ENCOUNTER — Ambulatory Visit (HOSPITAL_COMMUNITY)
Admission: RE | Admit: 2018-07-19 | Discharge: 2018-07-19 | Disposition: A | Discharge status: Home / Self Care | Payer: Medicaid Other | Source: Ambulatory Visit | Attending: Surgery | Admitting: Surgery

## 2018-07-19 ENCOUNTER — Encounter: Payer: Self-pay | Admitting: Dietician

## 2018-07-19 ENCOUNTER — Ambulatory Visit (HOSPITAL_COMMUNITY): Payer: Medicaid Other | Admitting: Anesthesiology

## 2018-07-19 DIAGNOSIS — Z170 Estrogen receptor positive status [ER+]: Secondary | ICD-10-CM | POA: Insufficient documentation

## 2018-07-19 DIAGNOSIS — Z79899 Other long term (current) drug therapy: Secondary | ICD-10-CM | POA: Diagnosis not present

## 2018-07-19 DIAGNOSIS — Z6841 Body Mass Index (BMI) 40.0 and over, adult: Secondary | ICD-10-CM | POA: Diagnosis not present

## 2018-07-19 DIAGNOSIS — E1169 Type 2 diabetes mellitus with other specified complication: Secondary | ICD-10-CM | POA: Insufficient documentation

## 2018-07-19 DIAGNOSIS — Z9981 Dependence on supplemental oxygen: Secondary | ICD-10-CM | POA: Insufficient documentation

## 2018-07-19 DIAGNOSIS — C773 Secondary and unspecified malignant neoplasm of axilla and upper limb lymph nodes: Secondary | ICD-10-CM | POA: Diagnosis not present

## 2018-07-19 DIAGNOSIS — C50411 Malignant neoplasm of upper-outer quadrant of right female breast: Secondary | ICD-10-CM | POA: Diagnosis present

## 2018-07-19 DIAGNOSIS — I2510 Atherosclerotic heart disease of native coronary artery without angina pectoris: Secondary | ICD-10-CM | POA: Insufficient documentation

## 2018-07-19 DIAGNOSIS — E7800 Pure hypercholesterolemia, unspecified: Secondary | ICD-10-CM | POA: Diagnosis not present

## 2018-07-19 DIAGNOSIS — Z7902 Long term (current) use of antithrombotics/antiplatelets: Secondary | ICD-10-CM | POA: Diagnosis not present

## 2018-07-19 DIAGNOSIS — I509 Heart failure, unspecified: Secondary | ICD-10-CM | POA: Diagnosis not present

## 2018-07-19 DIAGNOSIS — Z87891 Personal history of nicotine dependence: Secondary | ICD-10-CM | POA: Diagnosis not present

## 2018-07-19 DIAGNOSIS — Z7984 Long term (current) use of oral hypoglycemic drugs: Secondary | ICD-10-CM | POA: Diagnosis not present

## 2018-07-19 DIAGNOSIS — Z803 Family history of malignant neoplasm of breast: Secondary | ICD-10-CM | POA: Insufficient documentation

## 2018-07-19 DIAGNOSIS — Z86711 Personal history of pulmonary embolism: Secondary | ICD-10-CM | POA: Diagnosis not present

## 2018-07-19 DIAGNOSIS — Z7951 Long term (current) use of inhaled steroids: Secondary | ICD-10-CM | POA: Insufficient documentation

## 2018-07-19 DIAGNOSIS — G4730 Sleep apnea, unspecified: Secondary | ICD-10-CM | POA: Insufficient documentation

## 2018-07-19 DIAGNOSIS — Z952 Presence of prosthetic heart valve: Secondary | ICD-10-CM | POA: Diagnosis not present

## 2018-07-19 DIAGNOSIS — K219 Gastro-esophageal reflux disease without esophagitis: Secondary | ICD-10-CM | POA: Diagnosis not present

## 2018-07-19 DIAGNOSIS — J449 Chronic obstructive pulmonary disease, unspecified: Secondary | ICD-10-CM | POA: Insufficient documentation

## 2018-07-19 DIAGNOSIS — I252 Old myocardial infarction: Secondary | ICD-10-CM | POA: Insufficient documentation

## 2018-07-19 DIAGNOSIS — I110 Hypertensive heart disease with heart failure: Secondary | ICD-10-CM | POA: Insufficient documentation

## 2018-07-19 DIAGNOSIS — C50911 Malignant neoplasm of unspecified site of right female breast: Secondary | ICD-10-CM

## 2018-07-19 HISTORY — PX: BREAST LUMPECTOMY WITH RADIOACTIVE SEED AND SENTINEL LYMPH NODE BIOPSY: SHX6550

## 2018-07-19 LAB — GLUCOSE, CAPILLARY
GLUCOSE-CAPILLARY: 160 mg/dL — AB (ref 70–99)
Glucose-Capillary: 242 mg/dL — ABNORMAL HIGH (ref 70–99)

## 2018-07-19 SURGERY — BREAST LUMPECTOMY WITH RADIOACTIVE SEED AND SENTINEL LYMPH NODE BIOPSY
Anesthesia: General | Site: Breast | Laterality: Right

## 2018-07-19 MED ORDER — PHENYLEPHRINE 40 MCG/ML (10ML) SYRINGE FOR IV PUSH (FOR BLOOD PRESSURE SUPPORT)
PREFILLED_SYRINGE | INTRAVENOUS | Status: DC | PRN
  Administered 2018-07-19: 120 ug via INTRAVENOUS
  Administered 2018-07-19: 80 ug via INTRAVENOUS
  Administered 2018-07-19: 120 ug via INTRAVENOUS

## 2018-07-19 MED ORDER — SCOPOLAMINE 1 MG/3DAYS TD PT72
MEDICATED_PATCH | TRANSDERMAL | Status: AC
  Filled 2018-07-19: qty 5

## 2018-07-19 MED ORDER — CEFAZOLIN SODIUM-DEXTROSE 2-4 GM/100ML-% IV SOLN
INTRAVENOUS | Status: AC
  Filled 2018-07-19: qty 100

## 2018-07-19 MED ORDER — ONDANSETRON HCL 4 MG/2ML IJ SOLN
INTRAMUSCULAR | Status: AC
  Filled 2018-07-19: qty 2

## 2018-07-19 MED ORDER — ALBUTEROL SULFATE HFA 108 (90 BASE) MCG/ACT IN AERS
INHALATION_SPRAY | RESPIRATORY_TRACT | Status: DC | PRN
  Administered 2018-07-19: 4 via RESPIRATORY_TRACT

## 2018-07-19 MED ORDER — HYDROCODONE-ACETAMINOPHEN 5-325 MG PO TABS: 1.0000 | ORAL_TABLET | Freq: Four times a day (QID) | ORAL | 0 refills | Status: DC | PRN

## 2018-07-19 MED ORDER — ACETAMINOPHEN 160 MG/5ML PO SOLN: 325.0000 mg | ORAL | Status: DC | PRN

## 2018-07-19 MED ORDER — ACETAMINOPHEN 500 MG PO TABS
1000.0000 mg | ORAL_TABLET | ORAL | Status: AC
  Administered 2018-07-19: 1000 mg via ORAL
  Filled 2018-07-19: qty 2

## 2018-07-19 MED ORDER — ACETAMINOPHEN 325 MG PO TABS: 325.0000 mg | ORAL_TABLET | ORAL | Status: DC | PRN

## 2018-07-19 MED ORDER — GABAPENTIN 300 MG PO CAPS
300.0000 mg | ORAL_CAPSULE | ORAL | Status: AC
  Administered 2018-07-19: 300 mg via ORAL
  Filled 2018-07-19: qty 1

## 2018-07-19 MED ORDER — MIDAZOLAM HCL 2 MG/2ML IJ SOLN
INTRAMUSCULAR | Status: AC
  Filled 2018-07-19: qty 2

## 2018-07-19 MED ORDER — OXYCODONE HCL 5 MG PO TABS
5.0000 mg | ORAL_TABLET | Freq: Once | ORAL | Status: AC | PRN
  Administered 2018-07-19: 5 mg via ORAL

## 2018-07-19 MED ORDER — MIDAZOLAM HCL 5 MG/5ML IJ SOLN
INTRAMUSCULAR | Status: DC | PRN
  Administered 2018-07-19: 1 mg via INTRAVENOUS

## 2018-07-19 MED ORDER — METHYLENE BLUE 0.5 % INJ SOLN
INTRAVENOUS | Status: AC
  Filled 2018-07-19: qty 10

## 2018-07-19 MED ORDER — OXYCODONE HCL 5 MG PO TABS
ORAL_TABLET | ORAL | Status: AC
  Filled 2018-07-19: qty 1

## 2018-07-19 MED ORDER — LACTATED RINGERS IV SOLN
INTRAVENOUS | Status: DC | PRN
  Administered 2018-07-19: 07:00:00 via INTRAVENOUS

## 2018-07-19 MED ORDER — PROPOFOL 10 MG/ML IV BOLUS
INTRAVENOUS | Status: DC | PRN
  Administered 2018-07-19: 150 mg via INTRAVENOUS

## 2018-07-19 MED ORDER — CHLORHEXIDINE GLUCONATE CLOTH 2 % EX PADS: 6.0000 | MEDICATED_PAD | Freq: Once | CUTANEOUS | Status: DC

## 2018-07-19 MED ORDER — SUCCINYLCHOLINE CHLORIDE 200 MG/10ML IV SOSY
PREFILLED_SYRINGE | INTRAVENOUS | Status: DC | PRN
  Administered 2018-07-19: 200 mg via INTRAVENOUS

## 2018-07-19 MED ORDER — BUPIVACAINE HCL (PF) 0.5 % IJ SOLN
INTRAMUSCULAR | Status: DC | PRN
  Administered 2018-07-19: 20 mL

## 2018-07-19 MED ORDER — ONDANSETRON HCL 4 MG/2ML IJ SOLN: 4.0000 mg | Freq: Once | INTRAMUSCULAR | Status: DC | PRN

## 2018-07-19 MED ORDER — LIDOCAINE 2% (20 MG/ML) 5 ML SYRINGE
INTRAMUSCULAR | Status: DC | PRN
  Administered 2018-07-19: 100 mg via INTRAVENOUS

## 2018-07-19 MED ORDER — FENTANYL CITRATE (PF) 250 MCG/5ML IJ SOLN
INTRAMUSCULAR | Status: AC
  Filled 2018-07-19: qty 5

## 2018-07-19 MED ORDER — MEPERIDINE HCL 50 MG/ML IJ SOLN: 6.2500 mg | INTRAMUSCULAR | Status: DC | PRN

## 2018-07-19 MED ORDER — FENTANYL CITRATE (PF) 100 MCG/2ML IJ SOLN
INTRAMUSCULAR | Status: AC
  Filled 2018-07-19: qty 2

## 2018-07-19 MED ORDER — BUPIVACAINE-EPINEPHRINE 0.25% -1:200000 IJ SOLN
INTRAMUSCULAR | Status: DC | PRN
  Administered 2018-07-19: 20 mL

## 2018-07-19 MED ORDER — BUPIVACAINE LIPOSOME 1.3 % IJ SUSP
INTRAMUSCULAR | Status: DC | PRN
  Administered 2018-07-19: 10 mL

## 2018-07-19 MED ORDER — OXYCODONE HCL 5 MG/5ML PO SOLN: 5.0000 mg | Freq: Once | ORAL | Status: AC | PRN

## 2018-07-19 MED ORDER — SODIUM CHLORIDE 0.9 % IV SOLN
INTRAVENOUS | Status: DC | PRN
  Administered 2018-07-19: 60 ug/min via INTRAVENOUS

## 2018-07-19 MED ORDER — 0.9 % SODIUM CHLORIDE (POUR BTL) OPTIME
TOPICAL | Status: DC | PRN
Start: 2018-07-19 — End: 2018-07-19
  Administered 2018-07-19: 1000 mL

## 2018-07-19 MED ORDER — TECHNETIUM TC 99M SULFUR COLLOID FILTERED
1.0000 | Freq: Once | INTRAVENOUS | Status: AC | PRN
  Administered 2018-07-19: 1 via INTRADERMAL

## 2018-07-19 MED ORDER — FENTANYL CITRATE (PF) 100 MCG/2ML IJ SOLN
25.0000 ug | INTRAMUSCULAR | Status: DC | PRN
  Administered 2018-07-19: 50 ug via INTRAVENOUS

## 2018-07-19 MED ORDER — SUGAMMADEX SODIUM 500 MG/5ML IV SOLN
INTRAVENOUS | Status: AC
  Filled 2018-07-19: qty 5

## 2018-07-19 MED ORDER — DEXAMETHASONE SODIUM PHOSPHATE 10 MG/ML IJ SOLN: INTRAMUSCULAR | Status: DC | PRN

## 2018-07-19 MED ORDER — BUPIVACAINE-EPINEPHRINE (PF) 0.25% -1:200000 IJ SOLN
INTRAMUSCULAR | Status: AC
  Filled 2018-07-19: qty 30

## 2018-07-19 MED ORDER — ONDANSETRON HCL 4 MG/2ML IJ SOLN
INTRAMUSCULAR | Status: DC | PRN
  Administered 2018-07-19: 4 mg via INTRAVENOUS

## 2018-07-19 MED ORDER — FENTANYL CITRATE (PF) 250 MCG/5ML IJ SOLN
INTRAMUSCULAR | Status: DC | PRN
  Administered 2018-07-19: 50 ug via INTRAVENOUS

## 2018-07-19 MED ORDER — SODIUM CHLORIDE 0.9 % IJ SOLN
INTRAVENOUS | Status: DC | PRN
  Administered 2018-07-19: 1 mL via INTRAMUSCULAR

## 2018-07-19 MED ORDER — PROPOFOL 10 MG/ML IV BOLUS
INTRAVENOUS | Status: AC
Start: 2018-07-19 — End: ?
  Filled 2018-07-19: qty 20

## 2018-07-19 MED ORDER — SODIUM CHLORIDE 0.9 % IJ SOLN
INTRAMUSCULAR | Status: AC
  Filled 2018-07-19: qty 10

## 2018-07-19 MED ORDER — SCOPOLAMINE 1 MG/3DAYS TD PT72
MEDICATED_PATCH | TRANSDERMAL | Status: DC | PRN
  Administered 2018-07-19: 1 via TRANSDERMAL

## 2018-07-19 SURGICAL SUPPLY — 51 items
BINDER BREAST 3XL (GAUZE/BANDAGES/DRESSINGS) ×2 IMPLANT
BINDER BREAST LRG (GAUZE/BANDAGES/DRESSINGS) IMPLANT
BINDER BREAST XLRG (GAUZE/BANDAGES/DRESSINGS) IMPLANT
BIOPATCH RED 1 DISK 7.0 (GAUZE/BANDAGES/DRESSINGS) ×2 IMPLANT
BLADE SURG 15 STRL LF DISP TIS (BLADE) ×2 IMPLANT
BLADE SURG 15 STRL SS (BLADE) ×2
CANISTER SUCT 3000ML PPV (MISCELLANEOUS) IMPLANT
CHLORAPREP W/TINT 26ML (MISCELLANEOUS) ×2 IMPLANT
CLIP VESOCCLUDE SM WIDE 6/CT (CLIP) ×4 IMPLANT
COVER PROBE W GEL 5X96 (DRAPES) ×2 IMPLANT
COVER SURGICAL LIGHT HANDLE (MISCELLANEOUS) ×2 IMPLANT
DECANTER SPIKE VIAL GLASS SM (MISCELLANEOUS) IMPLANT
DERMABOND ADVANCED (GAUZE/BANDAGES/DRESSINGS) ×1
DERMABOND ADVANCED .7 DNX12 (GAUZE/BANDAGES/DRESSINGS) ×1 IMPLANT
DEVICE DUBIN SPECIMEN MAMMOGRA (MISCELLANEOUS) ×2 IMPLANT
DRAIN CHANNEL 19F RND (DRAIN) ×2 IMPLANT
DRAPE CHEST BREAST 15X10 FENES (DRAPES) ×2 IMPLANT
DRAPE UTILITY XL STRL (DRAPES) ×2 IMPLANT
ELECT COATED BLADE 2.86 ST (ELECTRODE) ×2 IMPLANT
ELECT REM PT RETURN 9FT ADLT (ELECTROSURGICAL) ×2
ELECTRODE REM PT RTRN 9FT ADLT (ELECTROSURGICAL) ×1 IMPLANT
EVACUATOR SILICONE 100CC (DRAIN) ×2 IMPLANT
GAUZE SPONGE 4X4 12PLY STRL (GAUZE/BANDAGES/DRESSINGS) ×2 IMPLANT
GLOVE SURG SIGNA 7.5 PF LTX (GLOVE) ×4 IMPLANT
GOWN STRL REUS W/ TWL LRG LVL3 (GOWN DISPOSABLE) IMPLANT
GOWN STRL REUS W/ TWL XL LVL3 (GOWN DISPOSABLE) ×2 IMPLANT
GOWN STRL REUS W/TWL LRG LVL3 (GOWN DISPOSABLE)
GOWN STRL REUS W/TWL XL LVL3 (GOWN DISPOSABLE) ×2
ILLUMINATOR WAVEGUIDE N/F (MISCELLANEOUS) IMPLANT
KIT BASIN OR (CUSTOM PROCEDURE TRAY) ×2 IMPLANT
KIT MARKER MARGIN INK (KITS) ×2 IMPLANT
LIGHT WAVEGUIDE WIDE FLAT (MISCELLANEOUS) IMPLANT
NDL SAFETY ECLIPSE 18X1.5 (NEEDLE) ×1 IMPLANT
NEEDLE FILTER BLUNT 18X 1/2SAF (NEEDLE) ×1
NEEDLE FILTER BLUNT 18X1 1/2 (NEEDLE) ×1 IMPLANT
NEEDLE HYPO 18GX1.5 SHARP (NEEDLE) ×1
NEEDLE HYPO 25GX1X1/2 BEV (NEEDLE) ×2 IMPLANT
NS IRRIG 1000ML POUR BTL (IV SOLUTION) ×2 IMPLANT
PACK SURGICAL SETUP 50X90 (CUSTOM PROCEDURE TRAY) ×2 IMPLANT
PAD ABD 8X10 STRL (GAUZE/BANDAGES/DRESSINGS) ×2 IMPLANT
PENCIL BUTTON HOLSTER BLD 10FT (ELECTRODE) ×2 IMPLANT
SPONGE LAP 18X18 X RAY DECT (DISPOSABLE) ×2 IMPLANT
SUT ETHILON 2 0 FS 18 (SUTURE) ×2 IMPLANT
SUT MNCRL AB 4-0 PS2 18 (SUTURE) ×2 IMPLANT
SUT VIC AB 3-0 SH 8-18 (SUTURE) ×4 IMPLANT
SYR BULB 3OZ (MISCELLANEOUS) ×2 IMPLANT
SYR CONTROL 10ML LL (SYRINGE) ×4 IMPLANT
TOWEL OR 17X24 6PK STRL BLUE (TOWEL DISPOSABLE) ×2 IMPLANT
TOWEL OR 17X26 10 PK STRL BLUE (TOWEL DISPOSABLE) ×2 IMPLANT
TUBE CONNECTING 12X1/4 (SUCTIONS) ×2 IMPLANT
YANKAUER SUCT BULB TIP NO VENT (SUCTIONS) ×2 IMPLANT

## 2018-07-19 NOTE — Interval H&P Note (Signed)
History and Physical Interval Note:  07/19/2018 7:21 AM  [name]  has presented today for surgery, with the diagnosis of RIGHT BREAST CANCER  The various methods of treatment have been discussed with the patient and family.   Her husband had to do chores, but will be back.  After consideration of risks, benefits and other options for treatment, the patient has consented to  Procedure(s): BREAST LUMPECTOMY WITH RADIOACTIVE SEED AND SENTINEL LYMPH NODE BIOPSY (Right) as a surgical intervention .  The patient's history has been reviewed, patient examined, no change in status, stable for surgery.  I have reviewed the patient's chart and labs.  Questions were answered to the patient's satisfaction.     Shann Medal

## 2018-07-19 NOTE — Progress Notes (Signed)
Diabetes Self-Management Education  Visit Type: First/Initial  Appt. Start Time: 1015 Appt. End Time: 7893  07/19/2018  [name], identified by name and date of birth, is a 57 y.o. female with a diagnosis of Diabetes: Type 2.   ASSESSMENT  Last menstrual period 02/03/2012. There is no height or weight on file to calculate BMI.  Lab Results  Component Value Date   HGBA1C 9.1 (H) 07/02/2018     Diabetes Self-Management Education - 07/19/18 1200      Visit Information   Visit Type  First/Initial      Initial Visit   Diabetes Type  Type 2    Are you currently following a meal plan?  No    Are you taking your medications as prescribed?  Yes      Psychosocial Assessment   Patient Belief/Attitude about Diabetes  Motivated to manage diabetes    Self-care barriers  Lack of material resources    Self-management support  Doctor's office;Family;CDE visits    Patient Concerns  Nutrition/Meal planning;Medication    Special Needs  None    Preferred Learning Style  Auditory;Visual does not like to read, likes videos    Learning Readiness  Ready      Pre-Education Assessment   Patient understands incorporating nutritional management into lifestyle.  Needs Instruction    Patient understands using medications safely.  Needs Instruction    Patient understands monitoring blood glucose, interpreting and using results  Needs Instruction    Patient understands prevention, detection, and treatment of acute complications.  Needs Instruction      Exercise   Exercise Type  ADL's      Patient Education   Nutrition management   Food label reading, portion sizes and measuring food.;Carbohydrate counting    Medications  Reviewed patients medication for diabetes, action, purpose, timing of dose and side effects.      Individualized Goals (developed by patient)   Nutrition  Follow meal plan discussed      Outcomes   Expected Outcomes  Demonstrated interest in learning. Expect  positive outcomes    Future DMSE  4-6 wks    Program Status  Not Completed       Individualized Plan for Diabetes Self-Management Training:   Learning Objective:  Patient will have a greater understanding of diabetes self-management. Patient education plan is to attend individual and/or group sessions per assessed needs and concerns.   Plan:   Patient Instructions   [name],   It was very good meeting with you!  I recommend we follow up in 2-4 weeks.   I have givine you a handout about diabetes medicines.   Eating about the same amount of carbs at meals and snacks each day should help control your blood sugars. Eating about 4-5  carb choices at each meal and 1-2 carbs for snacks may work for you   Examples:  Breakfast: 2 slices bread + 1 fruit + 1 dairy = 4 carb choices     snack: 1 starch      Lunch: 2 starch + 2 milk = 4 carb choices     Snack: 1 milk + 1 fruit     Dinner: 2 starch + 2 fruit = 4 carb choices     Snack: 2 starches   Veggies, healthy fats and proteins (meats) do not count- add them as needed.    1 carb choice is: 8 oz milk, 8 oz plain or lite yogurt, 1 small piece of fruit,  1/2 cup fruit, 1 slice bread, 1/2 cup corn, 1/2 cup pasta, 1/2 cup potatoes, 1/2 cup hot cereal like grits or oatmeal, 1-2 cups non starchy vegetables like cabbage, broccoli, green beans   Here are some videos that I recommend that might help reinforce what we talked about today:   1- Digital[employer]room[employer]se  2-https://www.webmd.com/diabetes/blood-sugar-coach-17/diabetes-diet/video-diabetes-nutrition-label  3-https://www.webmd.com/diabetes/type2-heart-disease-18/video-diabetes-heart-foods   Expected Outcomes:  Demonstrated interest in learning. Expect positive outcomes  Education material provided: Meal plan card  If problems or questions, patient to contact team via:  Phone  Future DSME appointment: 4-6 wks  Debera Lat, RD 07/19/2018 1:42 PM.

## 2018-07-19 NOTE — Anesthesia Postprocedure Evaluation (Signed)
Anesthesia Post Note  Patient: [name]  Procedure(s) Performed: BREAST LUMPECTOMY WITH RADIOACTIVE SEED AND SENTINEL LYMPH NODE BIOPSY (Right Breast)     Patient location during evaluation: PACU Anesthesia Type: General Level of consciousness: awake and alert Pain management: pain level controlled Vital Signs Assessment: post-procedure vital signs reviewed and stable Respiratory status: spontaneous breathing, nonlabored ventilation, respiratory function stable and patient connected to nasal cannula oxygen Cardiovascular status: blood pressure returned to baseline and stable Postop Assessment: no apparent nausea or vomiting Anesthetic complications: no    Last Vitals:  Vitals:   07/19/18 1020 07/19/18 1030  BP: (!) 142/64   Pulse: 80 75  Resp: 20 13  Temp:    SpO2: 94% 92%    Last Pain:  Vitals:   07/19/18 1030  TempSrc:   PainSc: 8                  Crystale Giannattasio

## 2018-07-19 NOTE — Discharge Instructions (Signed)
° ° ° °  JP Drain [employer] this sheet to all of your post-operative appointments while you have your drains.  Please measure your drains by CC's or ML's.  Make sure you drain and measure your JP Drains 2 or 3 times per day.  At the end of each day, add up totals for the left side and add up totals for the right side.    ( 9 am )     ( 3 pm )        ( 9 pm )                Date L  R  L  R  L  R  Total L/R                                                                                                                                                                                       [facility] - DISCHARGE INSTRUCTIONS TO PATIENT  Activity:  Driving - May drive in 2 or 3 days, if doing well.   Lifting - No lifting more than 15 pounds for one week, then no limit  Wound Care:   Leave the incision dry for 2 days.  Then you may remove the breast binder and shower.        Empty the drain twice daily and record the amount.  Bring the record of the drainage to the office with you  Diet:  As tolerated  Follow up appointment:  Call Dr. Pollie Friar office [facility]) at [phone] for an appointment in 7 to 10 days for drain removal.  Medications and dosages:  Resume your home medications.  You have a prescription for:  Vicodin  Call Dr. Lucia Gaskins or his office  [phone]) if you have:  Temperature greater than 100.4,  Persistent nausea and vomiting,  Severe uncontrolled pain,  Redness, tenderness, or signs of infection (pain, swelling, redness, odor or green/yellow discharge around the site),  Difficulty breathing, headache or visual disturbances,  Any other questions or concerns you may have after discharge.  In an emergency, call 911 or go to an Emergency Department at a nearby hospital.

## 2018-07-19 NOTE — Transfer of Care (Signed)
Immediate Anesthesia Transfer of Care Note  Patient: [name]  Procedure(s) Performed: BREAST LUMPECTOMY WITH RADIOACTIVE SEED AND SENTINEL LYMPH NODE BIOPSY (Right Breast)  Patient Location: PACU  Anesthesia Type:GA combined with regional for post-op pain  Level of Consciousness: drowsy  Airway & Oxygen Therapy: Patient Spontanous Breathing and Patient connected to nasal cannula oxygen  Post-op Assessment: Report given to RN and Post -op Vital signs reviewed and stable  Post vital signs: Reviewed and stable  Last Vitals:  Vitals Value Taken Time  BP    Temp    Pulse 86 07/19/2018  9:49 AM  Resp 23 07/19/2018  9:49 AM  SpO2 97 % 07/19/2018  9:49 AM  Vitals shown include unvalidated device data.  Last Pain:  Vitals:   07/19/18 0645  TempSrc:   PainSc: 0-No pain      Patients Stated Pain Goal: 4 (23/95/32 0233)  Complications: No apparent anesthesia complications

## 2018-07-19 NOTE — Anesthesia Procedure Notes (Signed)
Anesthesia Regional Block: Pectoralis block   Pre-Anesthetic Checklist: ,, timeout performed, Correct Patient, Correct Site, Correct Laterality, Correct Procedure, Correct Position, site marked, Risks and benefits discussed,  Surgical consent,  Pre-op evaluation,  At surgeon's request and post-op pain management  Laterality: Right  Prep: chloraprep       Needles:  Injection technique: Single-shot  Needle Type: Echogenic Stimulator Needle     Needle Length: 5cm  Needle Gauge: 22     Additional Needles:   Procedures:, nerve stimulator,,, ultrasound used (permanent image in chart),,,,  Narrative:  Start time: 07/19/2018 6:50 AM End time: 07/19/2018 7:00 AM Injection made incrementally with aspirations every 5 mL.  Performed by: Personally  Anesthesiologist: Duane Boston, MD  Additional Notes: Functioning IV was confirmed and monitors were applied.  A 65mm 22ga Arrow echogenic stimulator needle was used. Sterile prep and drape,hand hygiene and sterile gloves were used. Ultrasound guidance: relevant anatomy identified, needle position confirmed, local anesthetic spread visualized around nerve(s)., vascular puncture avoided.  Image printed for medical record. Negative aspiration and negative test dose prior to incremental administration of local anesthetic. The patient tolerated the procedure well.

## 2018-07-19 NOTE — Progress Notes (Signed)
Internal Medicine Clinic Attending  I saw and evaluated the patient.  I personally confirmed the key portions of the history and exam documented by Dr. Vogel and I reviewed pertinent patient test results.  The assessment, diagnosis, and plan were formulated together and I agree with the documentation in the resident's note.  

## 2018-07-19 NOTE — Op Note (Addendum)
07/19/2018  9:43 AM  PATIENT:  [name] DOB: [birth year] MRN: [id number]  PREOP DIAGNOSIS:   RIGHT BREAST CANCER  POSTOP DIAGNOSIS:    Right breast cancer, 11 o'clock position (T1, N0)  PROCEDURE:   Procedure(s): Right BREAST LUMPECTOMY WITH RADIOACTIVE SEED AND right axillary SENTINEL LYMPH NODE BIOPSY, Injection of peri areolar area of breast with methylene blue (1.0 cc), deep sentinel lymph node biopsy (I made a single incision)  SURGEON:   Alphonsa Overall, M.D.  ANESTHESIA:   general  Anesthesiologist: Janeece Riggers, MD CRNA: Imagene Riches, CRNA; Theodoro Grist M, CRNA  General  EBL:  75  ml  DRAINS:  19 F Blake drain  LOCAL MEDICATIONS USED:   20 cc 1/4%marcaine + right pectoral block by anesthesia  SPECIMEN:   Right breast lumpectomy (6 color paint kit), medial margin, right axillary sentinel node - counts 2000 (not blue) and 500 (blue)  COUNTS CORRECT:  YES  INDICATIONS FOR PROCEDURE:  [name] is a 57 y.o. (DOB: [birth year]) white female whose primary care physician is Lorella Nimrod, MD and comes for right breast lumpectomy and right axillary sentinel lymph node biopsy.   She [facility] with Drs. Magrinat and [place].  She has a newly diagnosed right breast cancer.  The options for breast cancer treatment have been discussed with the patient. She elected to proceed with lumpectomy and axillary sentinel lymph node.    Her surgery was delayed because of difficulty in managing her diabetes.    The indications and potential complications of surgery were explained to the patient. Potential complications include, but are not limited to, bleeding, infection, the need for further surgery, and nerve injury.     She had a I131 seed placed on 07/16/2018 in her right breast at [facility].  The seed is in the 11 o'clock position of the right breast.   In the holding area, her right areola was injected with 1 millicurie of  Technitium Sulfur Colloid.  OPERATIVE NOTE:   The patient was taken to operating room # 9 at [facility] where she underwent a general anesthesia  supervised by Anesthesiologist: Janeece Riggers, MD CRNA: Imagene Riches, CRNA; Lavell Luster, CRNA. Her right breast and axilla were prepped with  ChloraPrep and sterilely draped.    A time-out and the surgical check list was reviewed.    I injected about 1.0 mL of 40% methylene blue around her right areola.   I turned attention to the cancer which was about at the 11 o'clock position of the right breast.   I made an incision in the upper outer quadrant, about 5 cm off the areola.   I used the Neoprobe to identify the I131 seed.  I tried to excise an area around the tumor of at least 1 cm.    I excised this block of breast tissue approximately 5 cm by 6 cm  in diameter.  I took the dissection down to the chest wall.   I painted the lumpectomy specimen with the 6 color paint kit and did a specimen mammogram which confirmed the mass, clip, and the seed were all in the right position in the specimen.  The specimen was sent to pathology who called back to confirm that they have the seed and the specimen.   I took an additional medial margin.  The suture marked the anterior margin and the specimen was painted.   I then started the right deep axillary  sentinel lymph node biopsy. I went through the breast incision to get to the axilla.  I found a hot area at the junction of the breast and the pectoralis major muscle, deep in the axilla. I cut down and identified two hot nodes that had counts of 2000 and 500 and the background has 30 counts. The lymph node with counts of 2000 was not blue and lymph node with counts of 500 was blue. I checked her internal mammary nodes and supraclavicular nodes with the neoprobe and found no other hot area. The axillary node was then sent to pathology.    I then irrigated the wound with saline. I infiltrated approximately 20 mL of  1/4% Marcaine between the incisions. I placed 4 clips to mark biopsy cavity, at 12, 3, 6, and 9 o'clock.  Because I went through the breat lumpectomy site to get to the axilla, I placed a 78 F Blake drain in the inferior mammary fold.  It was sewn in place with a 2-0 nylon suture.  I then closed all the wounds in layers using 3-0 Vicryl sutures for the deep layer. At the skin, I closed the incisions with a 4-0 Monocryl suture. The incisions were then painted with Dermabond.  She had gauze place over the wounds and placed in a breast binder.   The patient tolerated the procedure well, was transported to the recovery room in good condition. Sponge and needle count were correct at the end of the case.   Final pathology is pending.   Alphonsa Overall, MD, [facility] Surgery Pager: [phone] Office phone:  [phone]

## 2018-07-19 NOTE — Anesthesia Procedure Notes (Signed)
Procedure Name: Intubation Date/Time: 07/19/2018 7:41 AM Performed by: Imagene Riches, CRNA Pre-anesthesia Checklist: Patient identified, Emergency Drugs available, Suction available and Patient being monitored Patient Re-evaluated:Patient Re-evaluated prior to induction Oxygen Delivery Method: Circle System Utilized Preoxygenation: Pre-oxygenation with 100% oxygen Induction Type: IV induction Ventilation: Oral airway inserted - appropriate to patient size and Two handed mask ventilation required Laryngoscope Size: Glidescope and 4 Grade View: Grade I Tube type: Oral Tube size: 7.5 mm Number of attempts: 1 Airway Equipment and Method: Stylet and Oral airway Placement Confirmation: ETT inserted through vocal cords under direct vision,  positive ETCO2 and breath sounds checked- equal and bilateral Secured at: 23 cm Tube secured with: Tape Dental Injury: Teeth and Oropharynx as per pre-operative assessment

## 2018-07-20 ENCOUNTER — Other Ambulatory Visit: Payer: Self-pay | Admitting: Internal Medicine

## 2018-07-20 ENCOUNTER — Other Ambulatory Visit: Payer: No Typology Code available for payment source

## 2018-07-20 ENCOUNTER — Encounter (HOSPITAL_COMMUNITY): Payer: Self-pay | Admitting: Surgery

## 2018-07-20 DIAGNOSIS — I5032 Chronic diastolic (congestive) heart failure: Secondary | ICD-10-CM

## 2018-07-20 DIAGNOSIS — E119 Type 2 diabetes mellitus without complications: Secondary | ICD-10-CM

## 2018-07-20 NOTE — Telephone Encounter (Signed)
Needs to speak with a nurse about getting a refill on liraglutide (VICTOZA) 18 MG/3ML SOPN. Please call pt back.

## 2018-07-21 ENCOUNTER — Telehealth: Payer: Self-pay | Admitting: *Deleted

## 2018-07-21 MED ORDER — PEN NEEDLES 32G X 4 MM MISC: 1.0000 | Freq: Every day | 1 refills | Status: DC

## 2018-07-21 MED ORDER — LIRAGLUTIDE 18 MG/3ML ~~LOC~~ SOPN: PEN_INJECTOR | SUBCUTANEOUS | 5 refills | Status: DC

## 2018-07-21 NOTE — Telephone Encounter (Signed)
Prescriptions for Victoza and pen needles have been sent to [facility] and I called the patient to let her know

## 2018-07-21 NOTE — Telephone Encounter (Signed)
Ordered mammaprint per Dr. Magrinat. Faxed requisition to pathology and [facility] °

## 2018-07-21 NOTE — Addendum Note (Signed)
Addended by: Isabelle Course on: 07/21/2018 04:46 PM   Modules accepted: Orders

## 2018-07-21 NOTE — Telephone Encounter (Signed)
Victoza sent to [facility] on 07/16/2018. Patient states [facility] does not fill Victoza. Requesting refill be sent to [facility] on [address] Hartshorn, RN, BSN

## 2018-07-22 ENCOUNTER — Ambulatory Visit: Payer: Medicaid Other | Admitting: Radiation Oncology

## 2018-07-22 ENCOUNTER — Other Ambulatory Visit: Payer: Self-pay | Admitting: *Deleted

## 2018-07-22 ENCOUNTER — Ambulatory Visit: Payer: Medicaid Other

## 2018-07-22 DIAGNOSIS — E119 Type 2 diabetes mellitus without complications: Secondary | ICD-10-CM

## 2018-07-22 MED ORDER — LIRAGLUTIDE 18 MG/3ML ~~LOC~~ SOPN: PEN_INJECTOR | SUBCUTANEOUS | 5 refills | Status: DC

## 2018-07-22 NOTE — Telephone Encounter (Signed)
Fax from [facility] - Dr Donne Hazel is not registered w/ medicaid; confirmed w/Doris. Please re-send rx. Thanks

## 2018-07-26 ENCOUNTER — Other Ambulatory Visit: Payer: Self-pay | Admitting: *Deleted

## 2018-07-26 ENCOUNTER — Encounter: Payer: Self-pay | Admitting: Radiation Oncology

## 2018-07-26 ENCOUNTER — Telehealth: Payer: Self-pay | Admitting: *Deleted

## 2018-07-26 NOTE — Telephone Encounter (Signed)
Left detailed msg for pt stating Mammaprint testing has not returned and that as soon as results returned we will call with them. Contact information provided for further questions or needs.

## 2018-07-27 ENCOUNTER — Telehealth: Payer: Self-pay | Admitting: *Deleted

## 2018-07-27 NOTE — Telephone Encounter (Signed)
Spoke to pt and discussed results are not back from mammaprint. Informed that as soon as the results are back we will call with them. Received verbal understanding.

## 2018-07-27 NOTE — Telephone Encounter (Addendum)
Information was sent to [facility] for PA for Victoza.  Awaiting determination.  Sander Nephew, RN 07/27/2018 2:38 PM. PA for Victoza was approved 07/27/2018 thru 07/27/2019.  Sander Nephew, RN 07/27/2018 4:12 PM.

## 2018-07-29 ENCOUNTER — Telehealth: Payer: Self-pay | Admitting: *Deleted

## 2018-07-29 NOTE — Telephone Encounter (Signed)
Received Mammaprint results of low risk. Physician team notified. Called pt with results. Discussed she does not need chemotherapy and next step is radiation. Confirmed xrt appt with Dr. Lisbeth Renshaw. Denies further questions or needs at this time.

## 2018-07-29 NOTE — Progress Notes (Signed)
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 - SEE NOTE. Diagnosis Note Invasive carcinoma measures 1.0 cm in greatest dimension. Dr. Lyndon Code has reviewed this case and concurs with the above diagnosis. [facility] was notified on 06/07/18. Breast prognostic profile is pending and will be reported in an addendum. (NDK:gt, 06/07/18) Corliss Blacker   Receptor Status: ER(100 % +), PR (100 %  +), Her2-neu (-), Ki-(8%)   Did patient present with symptoms (if so, please note symptoms) or was this found on screening mammography?: Screening distortion on recent mammogram  Past/Anticipated interventions by surgeon, if any:  FINAL DIAGNOSIS Diagnosis 07-19-18  Dr. Alphonsa Overall 1. Breast, lumpectomy, Right w/seed - INVASIVE DUCTAL CARCINOMA WITH CALCIFICATIONS, 1.8 CM, GRADE 1. - DUCTAL CARCINOMA IN SITU, INTERMEDIATE NUCLEAR GRADE. - ALL RESECTIONS MARGINS ARE NEGATIVE FOR IN SITU OR INVASIVE CARCINOMA. - NEGATIVE FOR LYMPHOVASCULAR OR PERINEURAL INVASION. - BIOPSY SITE CHANGES. - SEE ONCOLOGY TABLE. 2. Breast, excision, Right Medial Margin - BENIGN BREAST TISSUE, NEGATIVE FOR IN SITU OR INVASIVE CARCINOMA. 3. Lymph node, sentinel, biopsy, Right Axillary - LYMPH NODE, NEGATIVE FOR CARCINOMA (0/1). 4. Lymph node, sentinel, biopsy - METASTATIC BREAST CARCINOMA TO LYMPH NODE (1/1). - METASTATIC CARCINOMA IS 0.1 CM IN GREATEST DIMENSION. 5. Lymph node, sentinel, biopsy - METASTATIC BREAST CARCINOMA TO LYMPH NODE (1/1). - METASTATIC CARCINOMA MEASURES 0.4 CM IN GREATEST DIMENSION. Microscopic Comment 1. INVASIVE CARCINOMA OF THE BREAST: Resection Procedure: Lumpectomy  Receptor Status: ER(100 % +), PR (100 %  +), Her2-neu (-), Ki-(8%)   Past/Anticipated interventions by medical oncology, if any:Saw Dr. Jana Hakim in th Breast clinic 06-16-18    Radiation Referral Anti-estrogen  Chemotherapy No  Lymphedema issues, if any: No   ROM to right arm good. Skin to right breast healing without signs of infection  Follow up appointment with Dr. Alphonsa Overall last week thinks it was 07-30-18 had JP drain removed and saw one of the PA-C's.  Pain issues, if any: No  SAFETY ISSUES:  Prior radiation?:No  Pacemaker/ICD? : No  Possible current pregnancy? :No  Is the patient on methotrexate? 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

## 2018-08-05 ENCOUNTER — Other Ambulatory Visit: Payer: Self-pay

## 2018-08-05 ENCOUNTER — Encounter: Payer: Self-pay | Admitting: Radiation Oncology

## 2018-08-05 ENCOUNTER — Ambulatory Visit
Admission: RE | Admit: 2018-08-05 | Discharge: 2018-08-05 | Disposition: A | Discharge status: Home / Self Care | Payer: Medicaid Other | Source: Ambulatory Visit | Attending: Radiation Oncology | Admitting: Radiation Oncology

## 2018-08-05 VITALS — BP 144/65 | HR 85 | Temp 98.8°F | Resp 20 | Ht 60.0 in | Wt 339.0 lb

## 2018-08-05 DIAGNOSIS — I252 Old myocardial infarction: Secondary | ICD-10-CM | POA: Insufficient documentation

## 2018-08-05 DIAGNOSIS — Z170 Estrogen receptor positive status [ER+]: Secondary | ICD-10-CM

## 2018-08-05 DIAGNOSIS — I5032 Chronic diastolic (congestive) heart failure: Secondary | ICD-10-CM | POA: Insufficient documentation

## 2018-08-05 DIAGNOSIS — Z955 Presence of coronary angioplasty implant and graft: Secondary | ICD-10-CM | POA: Diagnosis not present

## 2018-08-05 DIAGNOSIS — I110 Hypertensive heart disease with heart failure: Secondary | ICD-10-CM | POA: Diagnosis not present

## 2018-08-05 DIAGNOSIS — Z96652 Presence of left artificial knee joint: Secondary | ICD-10-CM | POA: Diagnosis not present

## 2018-08-05 DIAGNOSIS — Z952 Presence of prosthetic heart valve: Secondary | ICD-10-CM | POA: Insufficient documentation

## 2018-08-05 DIAGNOSIS — Z853 Personal history of malignant neoplasm of breast: Secondary | ICD-10-CM | POA: Insufficient documentation

## 2018-08-05 DIAGNOSIS — J449 Chronic obstructive pulmonary disease, unspecified: Secondary | ICD-10-CM

## 2018-08-05 DIAGNOSIS — C50411 Malignant neoplasm of upper-outer quadrant of right female breast: Secondary | ICD-10-CM

## 2018-08-05 DIAGNOSIS — Z87891 Personal history of nicotine dependence: Secondary | ICD-10-CM | POA: Insufficient documentation

## 2018-08-05 DIAGNOSIS — Z86711 Personal history of pulmonary embolism: Secondary | ICD-10-CM | POA: Insufficient documentation

## 2018-08-05 DIAGNOSIS — I2510 Atherosclerotic heart disease of native coronary artery without angina pectoris: Secondary | ICD-10-CM | POA: Insufficient documentation

## 2018-08-05 DIAGNOSIS — Z7982 Long term (current) use of aspirin: Secondary | ICD-10-CM | POA: Diagnosis not present

## 2018-08-05 DIAGNOSIS — Z08 Encounter for follow-up examination after completed treatment for malignant neoplasm: Secondary | ICD-10-CM | POA: Insufficient documentation

## 2018-08-05 DIAGNOSIS — Z79899 Other long term (current) drug therapy: Secondary | ICD-10-CM | POA: Diagnosis not present

## 2018-08-05 DIAGNOSIS — Z794 Long term (current) use of insulin: Secondary | ICD-10-CM | POA: Insufficient documentation

## 2018-08-05 DIAGNOSIS — E119 Type 2 diabetes mellitus without complications: Secondary | ICD-10-CM | POA: Insufficient documentation

## 2018-08-05 NOTE — Progress Notes (Signed)
Radiation Oncology         ([phone]) [phone] ________________________________  Name: [name]        MRN: [id number]  Date of Service: 08/05/2018 DOB: 1961/06/19  PY:PPJK, Soundra Pilon, MD  Magrinat, Virgie Dad, MD     REFERRING PHYSICIAN: Magrinat, Virgie Dad, MD   DIAGNOSIS: The encounter diagnosis was Malignant neoplasm of upper-outer quadrant of right breast in female, estrogen receptor positive ([place]).   HISTORY OF PRESENT ILLNESS: [name] is a 57 y.o. female originally seen in the multidisciplinary breast clinic for a new diagnosis of right breast cancer. The patient was noted to have a screening distortion on recent mammogram. She underwent diagnostic imagign and at 11:00 there was a mass measuring 1.8 x 1.8 x 1.4 cm and her axilla was also negative for adenopathy by that ultrasound on 06/02/18. 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 Her tumor was ER/PR positive, HER2 amplified, and her Ki67 was 8%. She underwent lumpectomy on 07/19/2018 which revealed a 1.8 cm invasive ductal carcinoma grade 1, with associated intermediate grade DCIS.  Margins were negative, however 2 of her 3 sampled lymph nodes were involved with carcinoma.  Her tumor was ER/PR positive, HER-2/neu negative, and a Ki-67 of 8%.  Her tissue was tested for MammaPrint which was low risk.  She comes today to discuss adjuvant radiation.  PREVIOUS RADIATION THERAPY: No   PAST MEDICAL HISTORY:  Past Medical History:  Diagnosis Date  . Anemia   . Aortic valve stenosis, severe   . 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 Asthma   . Blood transfusion    2013[facility]  . Breast cancer in female [facility])    Right  . CAD (coronary artery disease)    Cath 2010 with DES x 1 RCA-- PT'S CARDIOLOGIST IS DR. [place]  . Chronic diastolic congestive heart failure  ([place])   . COPD (chronic obstructive pulmonary disease) ([place])   . Depression   . Diabetes mellitus DIAGNOSED IN2010   Controll s with diet  . Eczema    on back  . Hyperlipidemia   . Hypertension   . Kidney stones   . Morbid obesity with body mass index of 60.0-69.9 in adult [facility])   . Myocardial infarction ([place])    PT THINKS SHE WAS DX WITH MI AT THE TIME OF HEART STENTING  . Pulmonary embolism ([place]) 02/08/12   S/P RT TOTAL KNEE ON 02/03/12--ON 02/08/12--DEVELOPED ACUTE SOB AND CHEST PAIN--AND DIAGNOSED WITH  PULMONARY EMBOLUS AND PNEUMONIA  . Restless leg syndrome   . Sleep apnea    uses 3 liters O2 at night   . Uterine fibroid    NO PROBLEMS AT PRESENT FROM THE FIBROIDS-STATES SHE IS POST MENOPAUSAL-LAST MENSES 2010 EXCEPT FOR EPISODE THIS YR OF BLEEDING RELATED TO FIBROIDS.  Marland Kitchen Weakness    BOTH HANDS - S/P BILATERAL CARPAL TUNNEL RELEASE--BUT STILL HAS WEAKNESS--OFTEN DROPS THINGS       PAST SURGICAL HISTORY: Past Surgical History:  Procedure Laterality Date  . BREAST LUMPECTOMY WITH RADIOACTIVE SEED AND SENTINEL LYMPH NODE BIOPSY Right 07/19/2018   Procedure: BREAST LUMPECTOMY WITH RADIOACTIVE SEED AND SENTINEL LYMPH NODE BIOPSY;  Surgeon: Alphonsa Overall, MD;  Location: [place];  Service: General;  Laterality: Right;  . CARDIAC CATHETERIZATION    .  CARPAL TUNNEL RELEASE     Bilateral  . CHOLECYSTECTOMY    . CORONARY ANGIOPLASTY     2010 has stent in place  . CYSTOSCOPY W/ RETROGRADES Right 09/21/2013   Procedure: CYSTOSCOPY WITH RIGHT RETROGRADE PYELOGRAM RIGHT DOUBLE J STENT ;  Surgeon: Fredricka Bonine, MD;  [facility];  Service: Urology;  Laterality: Right;  . CYSTOSCOPY WITH URETEROSCOPY AND STENT PLACEMENT Right 10/25/2013   Procedure: CYSTOSCOPY RIGHT URETEROSCOPY HOLMIUM LASER LITHO AND STENT PLACEMENT;  Surgeon: Fredricka Bonine, MD;  Location: [facility];  Service: Urology;  Laterality: Right;  . HERNIA REPAIR    . INTRAOPERATIVE TRANSESOPHAGEAL  ECHOCARDIOGRAM N/A 12/12/2014   Procedure: INTRAOPERATIVE TRANSESOPHAGEAL ECHOCARDIOGRAM;  Surgeon: Burnell Blanks, MD;  Location: [facility] OR;  Service: Cardiovascular;  Laterality: N/A;  . JOINT REPLACEMENT     bil total knees  . KNEE ARTHROPLASTY  02/03/2012   Procedure: COMPUTER ASSISTED TOTAL KNEE ARTHROPLASTY;  Surgeon: Mcarthur Rossetti, MD;  Location: [place];  Service: Orthopedics;  Laterality: Right;  Right total knee arthroplasty  . LEFT AND RIGHT HEART CATHETERIZATION WITH CORONARY ANGIOGRAM N/A 03/17/2013   Procedure: LEFT AND RIGHT HEART CATHETERIZATION WITH CORONARY ANGIOGRAM;  Surgeon: Burnell Blanks, MD;  Location: [facility] CATH LAB;  Service: Cardiovascular;  Laterality: N/A;  . LEFT AND RIGHT HEART CATHETERIZATION WITH CORONARY/GRAFT ANGIOGRAM N/A 09/14/2014   Procedure: LEFT AND RIGHT HEART CATHETERIZATION WITH Beatrix Fetters;  Surgeon: Burnell Blanks, MD;  Location: [facility] CATH LAB;  Service: Cardiovascular;  Laterality: N/A;  . TEE WITHOUT CARDIOVERSION N/A 03/14/2013   Procedure: TRANSESOPHAGEAL ECHOCARDIOGRAM (TEE);  Surgeon: Lelon Perla, MD;  Location: [facility] ENDOSCOPY;  Service: Cardiovascular;  Laterality: N/A;  . TEE WITHOUT CARDIOVERSION N/A 11/14/2014   Procedure: TRANSESOPHAGEAL ECHOCARDIOGRAM (TEE);  Surgeon: Lelon Perla, MD;  Location: [facility] ENDOSCOPY;  Service: Cardiovascular;  Laterality: N/A;  . TOTAL KNEE ARTHROPLASTY  09/10/2012   Procedure: TOTAL KNEE ARTHROPLASTY;  Surgeon: Mcarthur Rossetti, MD;  [facility];  Service: Orthopedics;  Laterality: Left;  . TOTAL KNEE REVISION Right 07/15/2013   Procedure: REVISION ARTHROPLASTY RIGHT KNEE;  Surgeon: Mcarthur Rossetti, MD;  [facility];  Service: Orthopedics;  Laterality: Right;  . TRANSCATHETER AORTIC VALVE REPLACEMENT, TRANSFEMORAL N/A 12/12/2014   Procedure: TRANSCATHETER AORTIC VALVE REPLACEMENT, TRANSFEMORAL;  Surgeon: Burnell Blanks, MD;  Location: [place];   Service: Cardiovascular;  Laterality: N/A;  . TRIGGER FINGER RELEASE  09/10/2012   Procedure: RELEASE TRIGGER FINGER/A-1 PULLEY;  Surgeon: Mcarthur Rossetti, MD;  [facility];  Service: Orthopedics;  Laterality: Right;  Right Ring Finger  . TUBAL LIGATION       FAMILY HISTORY:  Family History  Problem Relation Age of Onset  . Breast cancer Mother        stage IV at diagnosis  . Emphysema Mother        smoked  . Heart disease Mother   . COPD Father        smoked  . Asthma Father   . Heart disease Father   . Cancer Brother        Sinus     SOCIAL HISTORY:  reports that she quit smoking about 17 years ago. Her smoking use included cigarettes. She has a 45.00 pack-year smoking history. She has never used smokeless tobacco. She reports that she drinks about 1.0 standard drinks of alcohol per week. She reports that she does not use drugs. The patient is married and lives in [place].  ALLERGIES: Patient has no known allergies.   MEDICATIONS:  Current Outpatient Medications  Medication Sig Dispense Refill  . albuterol (PROVENTIL HFA;VENTOLIN HFA) 108 (90 Base) MCG/ACT inhaler Inhale 2 puffs into the lungs every 4 (four) hours as needed for wheezing or shortness of breath. 2 Inhaler 3  . Alirocumab (PRALUENT) 75 MG/ML SOPN Inject 75 mg into the skin every 14 (fourteen) days.     Marland Kitchen aspirin EC 81 MG tablet Take 1 tablet (81 mg total) by mouth daily. 90 tablet 3  . Blood Glucose Monitoring Suppl (ACCU-CHEK AVIVA CONNECT) w/Device KIT 1 kit by Does not apply route 2 (two) times daily at 8 am and 10 pm. 1 kit 0  . DULoxetine (CYMBALTA) 30 MG capsule Take 1 capsule (30 mg total) by mouth 2 (two) times daily. 60 capsule 6  . ezetimibe (ZETIA) 10 MG tablet Take 1 tablet (10 mg total) by mouth daily. 90 tablet 3  . Fluticasone-Umeclidin-Vilant (TRELEGY ELLIPTA) 100-62.5-25 MCG/INH AEPB Inhale 1 puff into the lungs daily. 1 each 11  . furosemide (LASIX) 40 MG tablet Take 1 tablet  (40 mg total) by mouth 2 (two) times daily. As needed for swelling (Patient taking differently: Take 40 mg by mouth 2 (two) times daily as needed (for swelling). ) 60 tablet 3  . gabapentin (NEURONTIN) 300 MG capsule TAKE 3 Capsules BY MOUTH EVERY NIGHT AT BEDTIME 90 capsule 1  . glucose blood (ACCU-CHEK AVIVA PLUS) test strip Use to test blood sugar 2 times daily. diag code E11.9. Non insulin dependent, A1C greater than 9. 300 each 1  . ibuprofen (ADVIL,MOTRIN) 200 MG tablet Take 400 mg by mouth every 6 (six) hours as needed for moderate pain.    . Insulin Pen Needle (PEN NEEDLES) 32G X 4 MM MISC 1 applicator by Does not apply route daily. 100 each 1  . Lancets (ACCU-CHEK SOFT TOUCH) lancets Use as instructed 100 each 12  . liraglutide (VICTOZA) 18 MG/3ML SOPN Inject 0.1 mLs (0.6 mg total) into the skin daily for 7 days, THEN 0.2 mLs (1.2 mg total) daily. 6.7 mL 5  . losartan (COZAAR) 100 MG tablet Take 1 tablet (100 mg total) by mouth daily. 90 tablet 1  . meloxicam (MOBIC) 15 MG tablet TAKE 1 TABLET BY MOUTH EVERY DAY FOR 7 DAYS - STOP CLOPIDOGREL FOR THOSE 7 DAYS - THEN TAKE 1 TABLET DAILY ONLY IF NEEDED AS DIRECTED (Patient taking differently: Take 15 mg by mouth daily as needed for pain) 30 tablet 0  . metFORMIN (GLUCOPHAGE-XR) 500 MG 24 hr tablet Take 1 tablet (500 mg total) by mouth daily with breakfast.    . metoprolol tartrate (LOPRESSOR) 25 MG tablet Take 1 tablet (25 mg total) by mouth 2 (two) times daily. 180 tablet 1  . OXYGEN Inhale 3 L into the lungs daily.    . potassium chloride SA (K-DUR,KLOR-CON) 20 MEQ tablet TAKE 2 Tablets BY MOUTH EVERY NIGHT AT BEDTIME DO NOT TAKE IF YOU DO NOT TAKE FUROSEMIDE 60 tablet 0  . Tetrahydrozoline HCl (VISINE OP) Place 2 drops into both eyes daily as needed (for dry eyes).    Marland Kitchen albuterol (PROVENTIL) (2.5 MG/3ML) 0.083% nebulizer solution INHALE 1 VIAL VIA NEBULIZER EVERY FOUR HOURS AS NEEDED FOR WHEEZING OR SHORTNESS OF BREATH 180 mL 2  .  atorvastatin (LIPITOR) 80 MG tablet Take 1 tablet (80 mg total) by mouth daily. 90 tablet 3  . clopidogrel (PLAVIX) 75 MG tablet Take 1 tablet (75 mg total) by  mouth every morning. 90 tablet 1  . nitroGLYCERIN (NITROSTAT) 0.4 MG SL tablet Place 1 tablet (0.4 mg total) under the tongue every 5 (five) minutes as needed for chest pain. 25 tablet 6   No current facility-administered medications for this encounter.      REVIEW OF SYSTEMS: On review of systems, the patient reports that she is doing well overall. She denies any chest pain, shortness of breath, cough, fevers, chills, night sweats, unintended weight changes. Her O2 continues at 3L [place]. She denies any bowel or bladder disturbances, and denies abdominal pain, nausea or vomiting. She denies any new musculoskeletal or joint aches or pains. A complete review of systems is obtained and is otherwise negative.     PHYSICAL EXAM:  Wt Readings from Last 3 Encounters:  08/05/18 (!) 339 lb (153.8 kg)  07/19/18 (!) 346 lb (156.9 kg)  07/16/18 (!) 346 lb 4.8 oz (157.1 kg)   Temp Readings from Last 3 Encounters:  08/05/18 98.8 F (37.1 C) (Oral)  07/19/18 (!) 97.3 F (36.3 C)  07/16/18 97.9 F (36.6 C) (Oral)   BP Readings from Last 3 Encounters:  08/05/18 (!) 144/65  07/19/18 (!) 150/80  07/16/18 140/76   Pulse Readings from Last 3 Encounters:  08/05/18 85  07/19/18 70  07/16/18 66     In general this is a chronically ill appearing obese caucasian female in no acute distress. She is alert and oriented x4 and appropriate throughout the examination. HEENT reveals that the patient is normocephalic, atraumatic. EOMs are intact. Cardiopulmonary assessment is negative for acute distress and she exhibits normal effort. The right breast has a well healed lumpectomy site. No fluctuance or erythema is noted.   ECOG = 0  0 - Asymptomatic (Fully active, able to carry on all predisease activities without restriction)  1 - Symptomatic but  completely ambulatory (Restricted in physically strenuous activity but ambulatory and able to carry out work of a light or sedentary nature. For example, light housework, office work)  2 - Symptomatic, <50% in bed during the day (Ambulatory and capable of all self care but unable to carry out any work activities. Up and about more than 50% of waking hours)  3 - Symptomatic, >50% in bed, but not bedbound (Capable of only limited self-care, confined to bed or chair 50% or more of waking hours)  4 - Bedbound (Completely disabled. Cannot carry on any self-care. Totally confined to bed or chair)  5 - Death   Eustace Pen MM, Creech RH, Tormey DC, et al. [phone]). "Toxicity and response criteria of the [facility]". [place] Oncol. 5 (6): 649-55    LABORATORY DATA:  Lab Results  Component Value Date   WBC 7.0 07/02/2018   HGB 12.7 07/02/2018   HCT 40.5 07/02/2018   MCV 83.7 07/02/2018   PLT 220 07/02/2018   Lab Results  Component Value Date   NA 134 (L) 07/02/2018   K 4.2 07/02/2018   CL 99 07/02/2018   CO2 25 07/02/2018   Lab Results  Component Value Date   ALT 22 06/16/2018   AST 16 06/16/2018   ALKPHOS 88 06/16/2018   BILITOT 0.7 06/16/2018      RADIOGRAPHY: Nm Sentinel Node Inj-no Rpt (breast)  Result Date: 07/19/2018 Sulfur colloid was injected by the nuclear medicine technologist for melanoma sentinel node.   Mm Breast Surgical Specimen  Result Date: 07/19/2018 CLINICAL DATA:  Right lumpectomy for recently diagnosed invasive ductal carcinoma and high-grade ductal  carcinoma in situ in the 11 o'clock position of the right breast. EXAM: SPECIMEN RADIOGRAPH OF THE RIGHT BREAST COMPARISON:  Previous exam(s). FINDINGS: Status post excision of the right breast. The radioactive seed, ribbon shaped biopsy marker clip and irregular mass are present, completely intact, and were marked for pathology. IMPRESSION: Specimen radiograph of the right breast. Electronically  Signed   By: Claudie Revering M.D.   On: 07/19/2018 08:28   Mm Rt Radioactive Seed Loc Mammo Guide  Result Date: 07/16/2018 CLINICAL DATA:  57 year old female presenting for radioactive seed and of the right breast prior to lumpectomy. EXAM: MAMMOGRAPHIC GUIDED RADIOACTIVE SEED LOCALIZATION OF THE RIGHT BREAST COMPARISON:  Previous exam(s). FINDINGS: Patient presents for radioactive seed localization prior to . I met with the patient and we discussed the procedure of seed localization including benefits and alternatives. We discussed the high likelihood of a successful procedure. We discussed the risks of the procedure including infection, bleeding, tissue injury and further surgery. We discussed the low dose of radioactivity involved in the procedure. Informed, written consent was given. The usual time-out protocol was performed immediately prior to the procedure. Using mammographic guidance, sterile technique, 1% lidocaine and an I-125 radioactive seed, the ribbon shaped clip in the upper-outer quadrant of the right breast was localized using a superior approach. The follow-up mammogram images confirm the seed in the expected location and were marked for Dr. Lucia Gaskins. Follow-up survey of the patient confirms presence of the radioactive seed. Order number of I-125 seed:  [id number]. Total activity:  0.865 millicuries reference Date: 07/02/2018 The patient tolerated the procedure well and was released from the [place]. She was given instructions regarding seed removal. IMPRESSION: Radioactive seed localization right breast. No apparent complications. Electronically Signed   By: Ammie Ferrier M.D.   On: 07/16/2018 15:37       IMPRESSION/PLAN: 1. Stage IA, [id number], grade 1 ER/PR positive invasive ductal carcinoma with calcifications and intermediate grade DCIS of the right breast. Dr. Lisbeth Renshaw reviews the final pathology findings and reviews the rationale for radiotherapy. Her situation is complicated by  the fact that she has nodal involvement as well as needs to avoid exposure to the lungs as much as possible due to her COPD. She would be a good candidate for being treated with high tangent fields to cover her axillary nodes, but needs to be prone to avoid exposure to her lungs. This would eliminate supraclavicular coverage, but the risks of exposing her lungs would outweigh the benefit to cover the supraclavicular region.   We discussed the risks, benefits, short, and long term effects of radiotherapy, and the patient is interested in proceeding. Dr. Lisbeth Renshaw discusses the delivery and logistics of radiotherapy and anticipates a course of  6 1/2 weeks of radiotherapy. Written consent is obtained and placed in the chart, a copy was provided to the patient. She will be contacted to proceed with simulation. 2. COPD, O2 dependant. The patient remains on 3L [place] continuously and will use our O2 during treatment.  In a visit lasting 25 minutes, greater than 50% of the time was spent face to face discussing her case, and coordinating the patient's care.   The above documentation reflects my direct findings during this shared patient visit. Please see the separate note by Dr. Lisbeth Renshaw on this date for the remainder of the patient's plan of care.    Carola Rhine, PAC

## 2018-08-06 ENCOUNTER — Other Ambulatory Visit: Payer: Medicaid Other | Admitting: *Deleted

## 2018-08-06 ENCOUNTER — Encounter (INDEPENDENT_AMBULATORY_CARE_PROVIDER_SITE_OTHER): Payer: Self-pay

## 2018-08-06 DIAGNOSIS — E7800 Pure hypercholesterolemia, unspecified: Secondary | ICD-10-CM

## 2018-08-07 LAB — LIPID PANEL
CHOL/HDL RATIO: 3.1 ratio (ref 0.0–4.4)
Cholesterol, Total: 153 mg/dL (ref 100–199)
HDL: 49 mg/dL (ref >39)
LDL CALC: 84 mg/dL (ref 0–99)
TRIGLYCERIDES: 99 mg/dL (ref 0–149)
VLDL Cholesterol Cal: 20 mg/dL (ref 5–40)

## 2018-08-07 LAB — HEPATIC FUNCTION PANEL
ALBUMIN: 4.2 g/dL (ref 3.5–5.5)
ALT: 22 IU/L (ref 0–32)
AST: 24 IU/L (ref 0–40)
Alkaline Phosphatase: 81 IU/L (ref 39–117)
BILIRUBIN, DIRECT: 0.23 mg/dL (ref 0.00–0.40)
Bilirubin Total: 1 mg/dL (ref 0.0–1.2)
Total Protein: 7 g/dL (ref 6.0–8.5)

## 2018-08-09 ENCOUNTER — Encounter (INDEPENDENT_AMBULATORY_CARE_PROVIDER_SITE_OTHER): Payer: Self-pay

## 2018-08-09 ENCOUNTER — Other Ambulatory Visit: Payer: Self-pay | Admitting: Internal Medicine

## 2018-08-09 ENCOUNTER — Other Ambulatory Visit: Payer: Self-pay

## 2018-08-09 ENCOUNTER — Encounter: Payer: Self-pay | Admitting: Internal Medicine

## 2018-08-09 ENCOUNTER — Ambulatory Visit: Payer: Medicaid Other | Admitting: Internal Medicine

## 2018-08-09 VITALS — BP 142/79 | HR 94 | Temp 98.1°F | Ht 60.0 in | Wt 338.9 lb

## 2018-08-09 DIAGNOSIS — I152 Hypertension secondary to endocrine disorders: Secondary | ICD-10-CM

## 2018-08-09 DIAGNOSIS — I10 Essential (primary) hypertension: Secondary | ICD-10-CM

## 2018-08-09 DIAGNOSIS — E1159 Type 2 diabetes mellitus with other circulatory complications: Secondary | ICD-10-CM

## 2018-08-09 DIAGNOSIS — Z7984 Long term (current) use of oral hypoglycemic drugs: Secondary | ICD-10-CM | POA: Diagnosis not present

## 2018-08-09 DIAGNOSIS — Z170 Estrogen receptor positive status [ER+]: Secondary | ICD-10-CM

## 2018-08-09 DIAGNOSIS — M25552 Pain in left hip: Secondary | ICD-10-CM

## 2018-08-09 DIAGNOSIS — Z79899 Other long term (current) drug therapy: Secondary | ICD-10-CM | POA: Diagnosis not present

## 2018-08-09 DIAGNOSIS — C50411 Malignant neoplasm of upper-outer quadrant of right female breast: Secondary | ICD-10-CM

## 2018-08-09 DIAGNOSIS — J441 Chronic obstructive pulmonary disease with (acute) exacerbation: Secondary | ICD-10-CM

## 2018-08-09 DIAGNOSIS — E119 Type 2 diabetes mellitus without complications: Secondary | ICD-10-CM

## 2018-08-09 DIAGNOSIS — I5032 Chronic diastolic (congestive) heart failure: Secondary | ICD-10-CM

## 2018-08-09 MED ORDER — METFORMIN HCL ER (MOD) 1000 MG PO TB24: 1000.0000 mg | ORAL_TABLET | Freq: Two times a day (BID) | ORAL | 3 refills | Status: DC

## 2018-08-09 NOTE — Assessment & Plan Note (Signed)
BP Readings from Last 3 Encounters:  08/09/18 (!) 142/79  08/05/18 (!) 144/65  07/19/18 (!) 150/80   Blood pressure was mildly elevated today. Patient is compliant with her losartan and metoprolol.  We will reassess during next follow-up visit as patient was experiencing pain during current visit.

## 2018-08-09 NOTE — Progress Notes (Addendum)
CC: Left hip pain and for follow-up of her hypertension and diabetes.  HPI:  [name] is a 57 y.o. with past medical history as listed below came to the clinic for follow-up of her hypertension and diabetes.  She was complaining of left hip pain, worsened since Saturday.  According to patient she has an history of childhood injury to left hip and since then she used to get intermittent left hip pain.  Since Saturday she is getting daily pain, non radiating, pain is on the lateral side of left hip, denies any tenderness, tingling or numbness.  No focal weakness.  No groin pain.  Pain worse with walking.  Patient to experience 10 to 15 minutes of stiffness once start walking after prolonged sitting.  Pain relieved while lying down.  She was taking meloxicam as needed which does help with pain.  Please see assessment and plan for her chronic conditions.  Past Medical History:  Diagnosis Date  . Anemia   . Aortic valve stenosis, severe   . 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 Asthma   . Blood transfusion    [facility]  . Breast cancer in female [facility])    Right  . CAD (coronary artery disease)    Cath 2010 with DES x 1 RCA-- PT'S CARDIOLOGIST IS DR. [place]  . Chronic diastolic congestive heart failure ([place])   . COPD (chronic obstructive pulmonary disease) ([place])   . Depression   . Diabetes mellitus DIAGNOSED IN2010   Controll s with diet  . Eczema    on back  . Hyperlipidemia   . Hypertension   . Kidney stones   . Morbid obesity with body mass index of 60.0-69.9 in adult [facility])   . Myocardial infarction ([place])    PT THINKS SHE WAS DX WITH MI AT THE TIME OF HEART STENTING  . Pulmonary embolism ([place]) 02/08/12   S/P RT TOTAL KNEE ON 02/03/12--ON 02/08/12--DEVELOPED ACUTE SOB AND CHEST PAIN--AND DIAGNOSED WITH  PULMONARY EMBOLUS AND PNEUMONIA  . Restless leg syndrome     . Sleep apnea    uses 3 liters O2 at night   . Uterine fibroid    NO PROBLEMS AT PRESENT FROM THE FIBROIDS-STATES SHE IS POST MENOPAUSAL-LAST MENSES 2010 EXCEPT FOR EPISODE THIS YR OF BLEEDING RELATED TO FIBROIDS.  Marland Kitchen Weakness    BOTH HANDS - S/P BILATERAL CARPAL TUNNEL RELEASE--BUT STILL HAS WEAKNESS--OFTEN DROPS THINGS   Review of Systems: Negative except mentioned in HPI.  Physical Exam:  Vitals:   08/09/18 1342  BP: (!) 142/79  Pulse: 94  Temp: 98.1 F (36.7 C)  TempSrc: Oral  SpO2: 94%  Weight: (!) 338 lb 14.4 oz (153.7 kg)  Height: 5' (1.524 m)   General: Vital signs reviewed.  Patient is well-developed and well-nourished, in no acute distress and cooperative with exam.  Head: Normocephalic and atraumatic. Eyes: EOMI, conjunctivae normal, no scleral icterus.  Cardiovascular: RRR, S1 normal, S2 normal, no murmurs, gallops, or rubs. Pulmonary/Chest: Clear to auscultation bilaterally, no wheezes, rales, or rhonchi. Abdominal: Soft, non-tender, non-distended, BS +, no masses, organomegaly, or guarding present.  Musculoskeletal: No focal tenderness along lateral left hip.  Restricted range of motion due to pain especially with hip flexion and external rotation.  No sensory or motor deficit. Extremities: No lower extremity edema bilaterally,  pulses symmetric and intact  bilaterally. No cyanosis or clubbing. Psychiatric: Normal mood and affect. speech and behavior is normal. Cognition and memory are normal.  Assessment & Plan:   See Encounters Tab for problem based charting.  Patient discussed with Dr. Evette Doffing.

## 2018-08-09 NOTE — Assessment & Plan Note (Signed)
Patient was recently diagnosed with ER, PR positive invasive ductal carcinoma of right breast, had her lumpectomy on July 19, 2018. Patient also found to have 2 out of 3 positive lymph node. She does not need any chemotherapy. We will start her radiation for 6-1/2-week in about 2 weeks.  She was advised to call in to follow-up with her oncologist and radiation oncologist.

## 2018-08-09 NOTE — Assessment & Plan Note (Signed)
Patient did bring her glucometer today which shows average glucose of 235 with high up to 350 and low up to 118. Patient recently increased her dose of Victoza to 1.2, does experience mild nausea, otherwise tolerating well. Patient wants to continue Victoza as she is losing some weight and not eating the way she used to eat before.  Increase Metformin to 1000 mg twice daily. Continue Victoza at 1.2 mg daily. Follow-up in 40-month.

## 2018-08-09 NOTE — Assessment & Plan Note (Signed)
Her pain symptoms were more consistent with greater trochanteric syndrome or osteoarthritis.  We will do left hip x-ray. Advised patient to continue taking meloxicam daily for few days then as needed. Was provided with some exercises for left hip.

## 2018-08-09 NOTE — Patient Instructions (Addendum)
Thank you for visiting clinic today. As we discussed I am increasing the dose of metformin to 1000 mg twice daily, you can finish up your existing prescription by taking 2 tablets in the morning and 2 in the evening, then start taking 1 tablet twice daily of your new prescription. I am also putting in order to do an x-ray of your left hip.  You can continue taking meloxicam if needed, you can take daily meloxicam for few days to see if that will control your pain. I am putting some exercises to be done, they will help with your hip pain. Please follow-up in 63-month.   Hip Exercises Ask your health care provider which exercises are safe for you. Do exercises exactly as told by your health care provider and adjust them as directed. It is normal to feel mild stretching, pulling, tightness, or discomfort as you do these exercises, but you should stop right away if you feel sudden pain or your pain gets worse.Do not begin these exercises until told by your health care provider. STRETCHING AND RANGE OF MOTION EXERCISES These exercises warm up your muscles and joints and improve the movement and flexibility of your hip. These exercises also help to relieve pain, numbness, and tingling. Exercise A: Hamstrings, Supine  1. Lie on your back. 2. Loop a belt or towel over the ball of your left / rightfoot. The ball of your foot is on the walking surface, right under your toes. 3. Straighten your left / rightknee and slowly pull on the belt to raise your leg. ? Do not let your left / right knee bend while you do this. ? Keep your other leg flat on the floor. ? Raise the left / right leg until you feel a gentle stretch behind your left / right knee or thigh. 4. Hold this position for __________ seconds. 5. Slowly return your leg to the starting position. Repeat __________ times. Complete this stretch __________ times a day. Exercise B: Hip Rotators  1. Lie on your back on a firm surface. 2. Hold your  left / right knee with your left / right hand. Hold your ankle with your other hand. 3. Gently pull your left / right knee and rotate your lower leg toward your other shoulder. ? Pull until you feel a stretch in your buttocks. ? Keep your hips and shoulders firmly planted while you do this stretch. 4. Hold this position for __________ seconds. Repeat __________ times. Complete this stretch __________ times a day. Exercise C: V-Sit (Hamstrings and Adductors)  1. Sit on the floor with your legs extended in a large "V" shape. Keep your knees straight during this exercise. 2. Start with your head and chest upright, then bend at your waist to reach for your left foot (position A). You should feel a stretch in your right inner thigh. 3. Hold this position for __________ seconds. Then slowly return to the upright position. 4. Bend at your waist to reach forward (position B). You should feel a stretch behind both of your thighs and knees. 5. Hold this position for __________ seconds. Then slowly return to the upright position. 6. Bend at your waist to reach for your right foot (position C). You should feel a stretch in your left inner thigh. 7. Hold this position for __________ seconds. Then slowly return to the upright position. Repeat __________ times. Complete this stretch __________ times a day. Exercise D: Lunge (Hip Flexors)  1. Place your left / right knee on  the floor and bend your other knee so that is directly over your ankle. You should be half-kneeling. 2. Keep good posture with your head over your shoulders. 3. Tighten your buttocks to point your tailbone downward. This helps your back to keep from arching too much. 4. You should feel a gentle stretch in the front of your left / right thigh and hip. If you do not feel any resistance, slightly slide your other foot forward and then slowly lunge forward so your knee once again lines up over your ankle. 5. Make sure your tailbone continues to  point downward. 6. Hold this position for __________ seconds. Repeat __________ times. Complete this stretch __________ times a day. STRENGTHENING EXERCISES These exercises build strength and endurance in your hip. Endurance is the ability to use your muscles for a long time, even after they get tired. Exercise E: Bridge (Hip Extensors)  1. Lie on your back on a firm surface with your knees bent and your feet flat on the floor. 2. Tighten your buttocks muscles and lift your bottom off the floor until the trunk of your body is level with your thighs. ? Do not arch your back. ? You should feel the muscles working in your buttocks and the back of your thighs. If you do not feel these muscles, slide your feet 1-2 inches (2.5-5 cm) farther away from your buttocks. 3. Hold this position for __________ seconds. 4. Slowly lower your hips to the starting position. 5. Let your muscles relax completely between repetitions. 6. If this exercise is too easy, try doing it with your arms crossed over your chest. Repeat __________ times. Complete this exercise __________ times a day. Exercise F: Straight Leg Raises - Hip Abductors  1. Lie on your side with your left / right leg in the top position. Lie so your head, shoulder, knee, and hip line up with each other. You may bend your bottom knee to help you balance. 2. Roll your hips slightly forward, so your hips are stacked directly over each other and your left / right knee is facing forward. 3. Leading with your heel, lift your top leg 4-6 inches (10-15 cm). You should feel the muscles in your outer hip lifting. ? Do not let your foot drift forward. ? Do not let your knee roll toward the ceiling. 4. Hold this position for __________ seconds. 5. Slowly return to the starting position. 6. Let your muscles relax completely between repetitions. Repeat __________ times. Complete this exercise __________ times a day. Exercise G: Straight Leg Raises - Hip  Adductors  1. Lie on your side with your left / right leg in the bottom position. Lie so your head, shoulder, knee, and hip line up. You may place your upper foot in front to help you balance. 2. Roll your hips slightly forward, so your hips are stacked directly over each other and your left / right knee is facing forward. 3. Tense the muscles in your inner thigh and lift your bottom leg 4-6 inches (10-15 cm). 4. Hold this position for __________ seconds. 5. Slowly return to the starting position. 6. Let your muscles relax completely between repetitions. Repeat __________ times. Complete this exercise __________ times a day. Exercise H: Straight Leg Raises - Quadriceps  1. Lie on your back with your left / right leg extended and your other knee bent. 2. Tense the muscles in the front of your left / right thigh. When you do this, you should see your kneecap  slide up or see increased dimpling just above your knee. 3. Tighten these muscles even more and raise your leg 4-6 inches (10-15 cm) off the floor. 4. Hold this position for __________ seconds. 5. Keep these muscles tense as you lower your leg. 6. Relax the muscles slowly and completely between repetitions. Repeat __________ times. Complete this exercise __________ times a day. Exercise I: Hip Abductors, Standing 1. Tie one end of a rubber exercise band or tubing to a secure surface, such as a table or pole. 2. Loop the other end of the band or tubing around your left / right ankle. 3. Keeping your ankle with the band or tubing directly opposite of the secured end, step away until there is tension in the tubing or band. Hold onto a chair as needed for balance. 4. Lift your left / right leg out to your side. While you do this: ? Keep your back upright. ? Keep your shoulders over your hips. ? Keep your toes pointing forward. ? Make sure to use your hip muscles to lift your leg. Do not "throw" your leg or tip your body to lift your  leg. 5. Hold this position for __________ seconds. 6. Slowly return to the starting position. Repeat __________ times. Complete this exercise __________ times a day. Exercise J: Squats (Quadriceps) 1. Stand in a door frame so your feet and knees are in line with the frame. You may place your hands on the frame for balance. 2. Slowly bend your knees and lower your hips like you are going to sit in a chair. ? Keep your lower legs in a straight-up-and-down position. ? Do not let your hips go lower than your knees. ? Do not bend your knees lower than told by your health care provider. ? If your hip pain increases, do not bend as low. 3. Hold this position for ___________ seconds. 4. Slowly push with your legs to return to standing. Do not use your hands to pull yourself to standing. Repeat __________ times. Complete this exercise __________ times a day. This information is not intended to replace advice given to you by your health care provider. Make sure you discuss any questions you have with your health care provider. Document Released: 01/02/2006 Document Revised: 09/08/2016 Document Reviewed: 12/10/2015 [employer] Interactive Patient Education  [employer].

## 2018-08-10 ENCOUNTER — Other Ambulatory Visit: Payer: Self-pay | Admitting: Internal Medicine

## 2018-08-10 DIAGNOSIS — I5032 Chronic diastolic (congestive) heart failure: Secondary | ICD-10-CM

## 2018-08-10 NOTE — Progress Notes (Signed)
Internal Medicine Clinic Attending  Case discussed with Dr. Amin at the time of the visit.  We reviewed the resident's history and exam and pertinent patient test results.  I agree with the assessment, diagnosis, and plan of care documented in the resident's note.    

## 2018-08-12 ENCOUNTER — Encounter: Payer: Self-pay | Admitting: Oncology

## 2018-08-14 ENCOUNTER — Emergency Department (HOSPITAL_COMMUNITY)
Admission: EM | Admit: 2018-08-14 | Discharge: 2018-08-14 | Disposition: A | Discharge status: Home / Self Care | Payer: Medicaid Other | Attending: Emergency Medicine | Admitting: Emergency Medicine

## 2018-08-14 ENCOUNTER — Encounter (HOSPITAL_COMMUNITY): Payer: Self-pay | Admitting: Emergency Medicine

## 2018-08-14 ENCOUNTER — Emergency Department (HOSPITAL_COMMUNITY): Payer: Medicaid Other

## 2018-08-14 DIAGNOSIS — Z96653 Presence of artificial knee joint, bilateral: Secondary | ICD-10-CM | POA: Insufficient documentation

## 2018-08-14 DIAGNOSIS — I2510 Atherosclerotic heart disease of native coronary artery without angina pectoris: Secondary | ICD-10-CM | POA: Insufficient documentation

## 2018-08-14 DIAGNOSIS — Z955 Presence of coronary angioplasty implant and graft: Secondary | ICD-10-CM | POA: Insufficient documentation

## 2018-08-14 DIAGNOSIS — M25552 Pain in left hip: Secondary | ICD-10-CM | POA: Insufficient documentation

## 2018-08-14 DIAGNOSIS — E119 Type 2 diabetes mellitus without complications: Secondary | ICD-10-CM | POA: Diagnosis not present

## 2018-08-14 DIAGNOSIS — I110 Hypertensive heart disease with heart failure: Secondary | ICD-10-CM | POA: Diagnosis not present

## 2018-08-14 DIAGNOSIS — I5032 Chronic diastolic (congestive) heart failure: Secondary | ICD-10-CM | POA: Insufficient documentation

## 2018-08-14 DIAGNOSIS — Z79899 Other long term (current) drug therapy: Secondary | ICD-10-CM | POA: Insufficient documentation

## 2018-08-14 DIAGNOSIS — J449 Chronic obstructive pulmonary disease, unspecified: Secondary | ICD-10-CM | POA: Diagnosis not present

## 2018-08-14 IMAGING — CR DG HIP (WITH OR WITHOUT PELVIS) 2-3V*L*
3 series · 3 of 3 positions shown · non-contrast
Comparison: [DATE] left hip radiographs

CLINICAL DATA: 56 y/o  F; 1 week of left hip pain.  Denies injury.

EXAM:
DG HIP (WITH OR WITHOUT PELVIS) 2-3V LEFT

[pelvis ap]
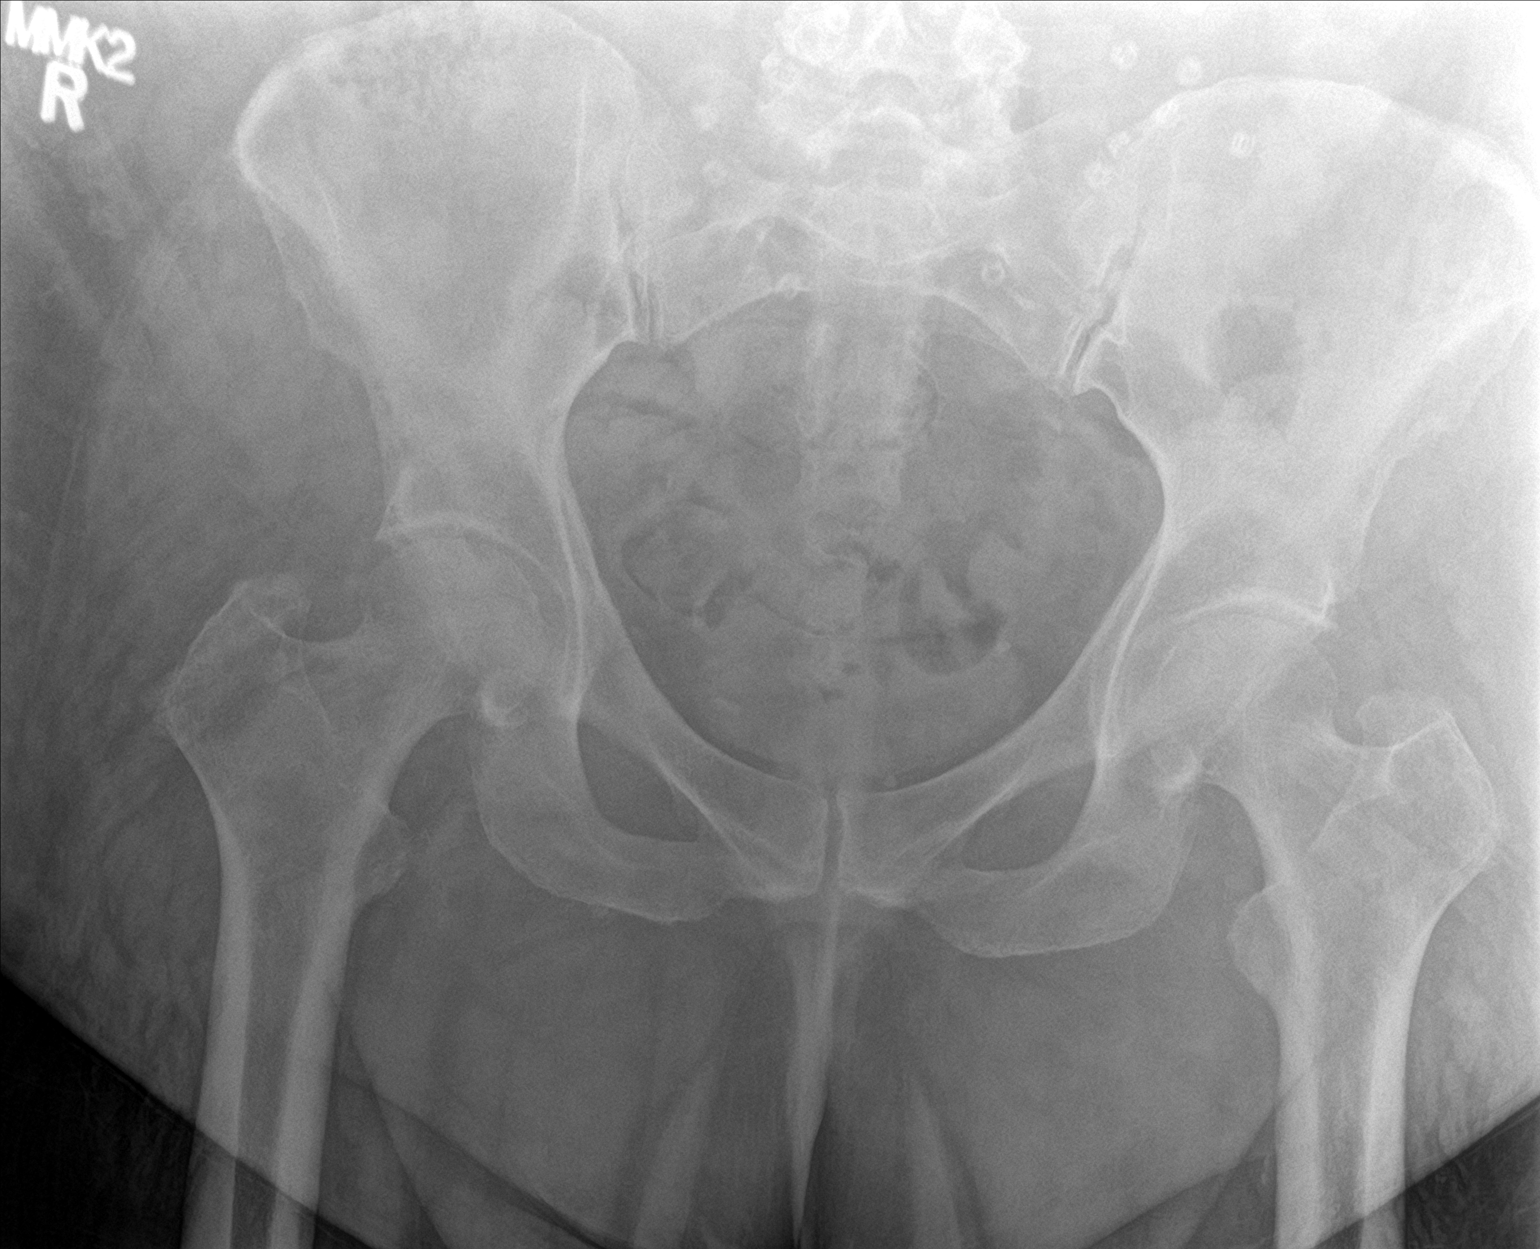

[hip ap]
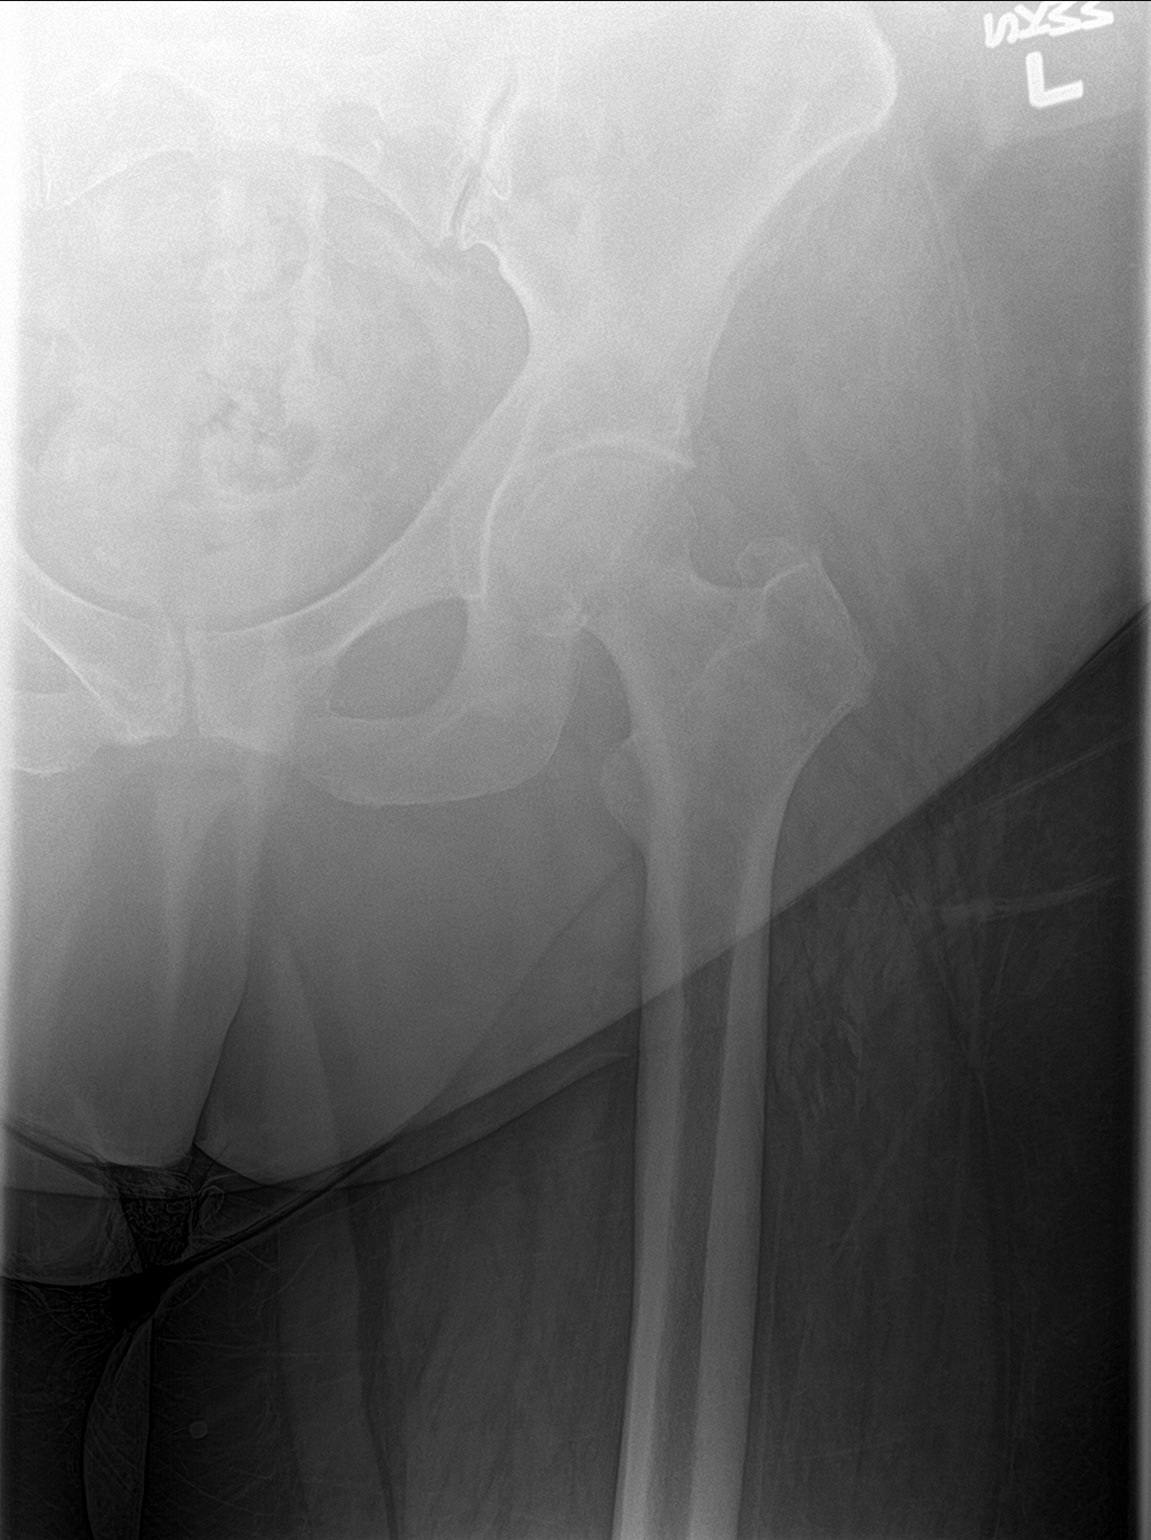

[hip lat]
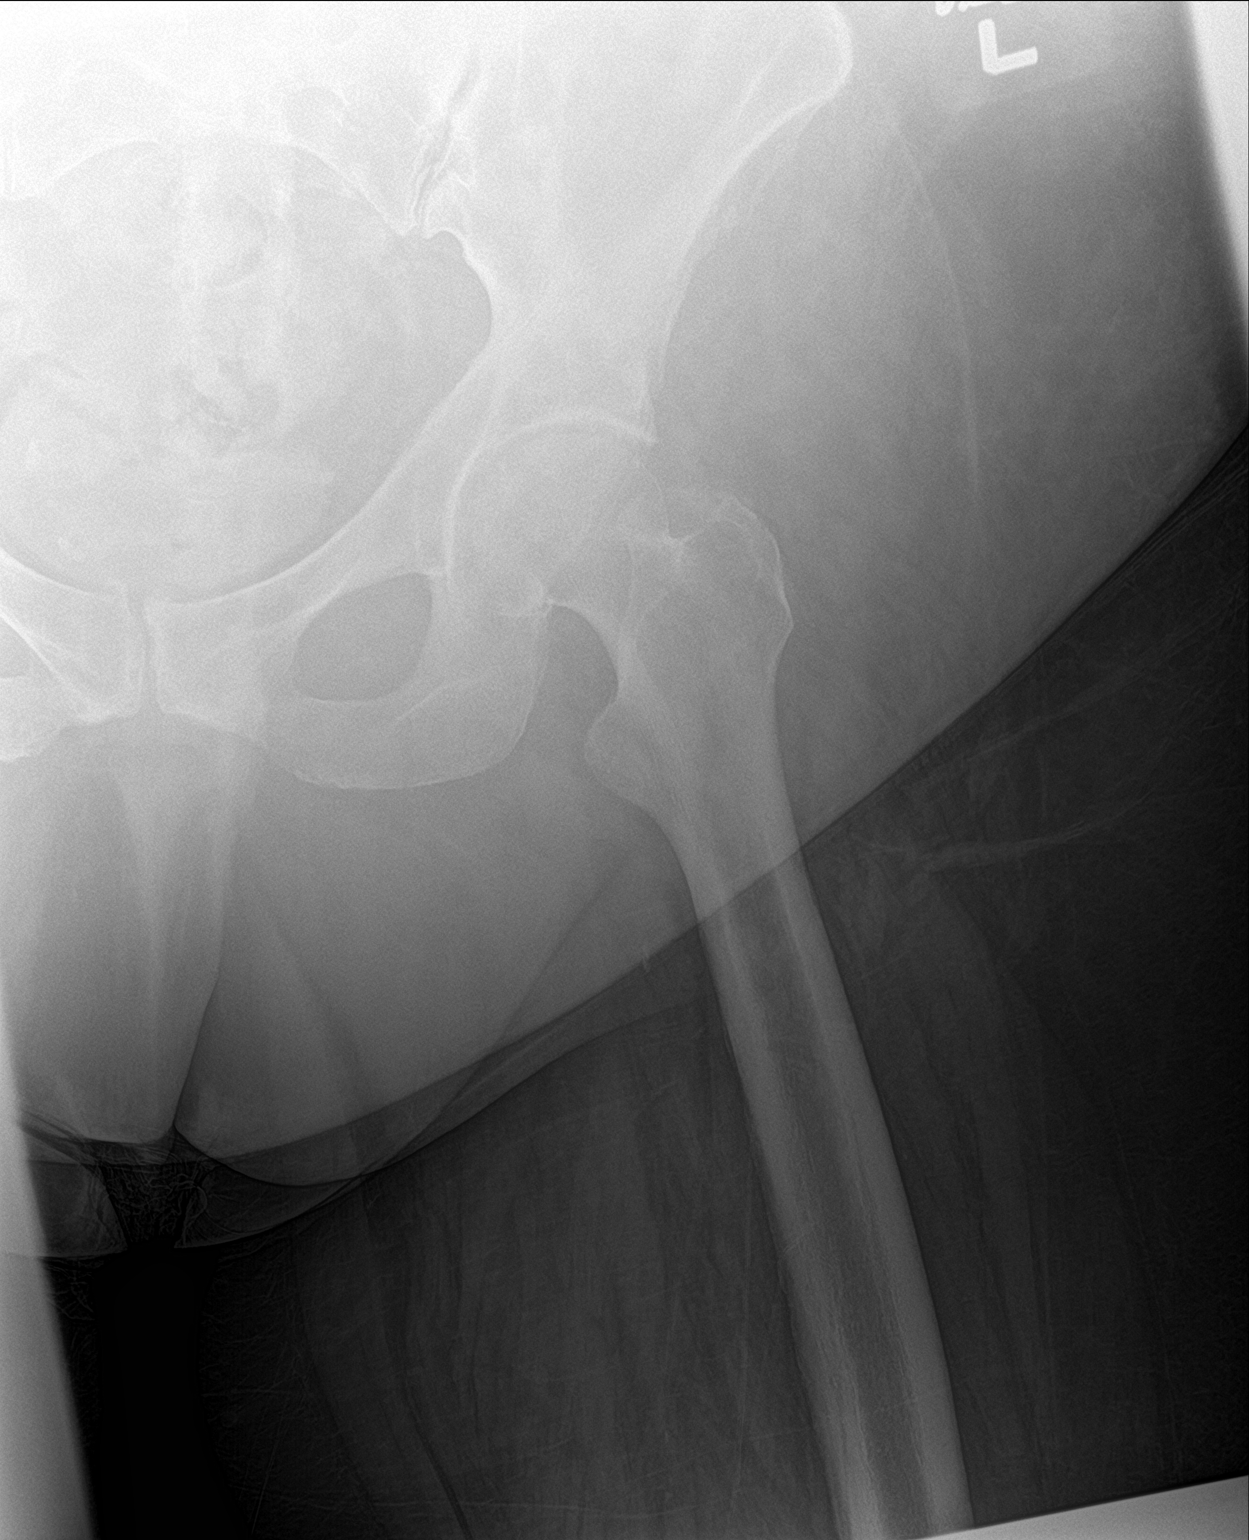

[3 of 3 positions shown; findings below may reference images not displayed]

FINDINGS: There is no evidence of hip fracture or dislocation. Mild right hip
osteoarthrosis with productive changes of the superolateral
acetabulum in superior loss of joint space. Stable mild
osteoarthrosis of the left hip joint productive changes of the
superolateral acetabulum and superior loss of joint space.
IMPRESSION: Mild osteoarthrosis of the hip joints, stable on the left. No acute
osseous abnormality.

By: NORBERTO M.D.

## 2018-08-14 NOTE — ED Triage Notes (Signed)
Patient to ED c/o L hip pain x 1 week, denies new injury. Patient reports her MD put in order for x-ray, but she has not had it yet. Pain worse with movement and position changes. Denies numbness/tingling.

## 2018-08-14 NOTE — ED Provider Notes (Signed)
[facility] EMERGENCY DEPARTMENT Provider Note   CSN: [id number] Arrival date & time: 08/14/18  [place]     History   Chief Complaint Chief Complaint  Patient presents with  . Hip Pain    HPI [name] is a 57 y.o. female.  57 y/o female with an extensive PMH such as CAD, COPD, DM presents to the ED with a chief complaint of left hip pain x few weeks.  Patient describes the pain in a constant stabbing manner, worse with movement and better with rest.  Patient was seen by her PCP last week who ordered a left hip x-ray, however patient was unable to attend the appointment or missed her x-ray.  Patient has tried taking meloxicam, ibuprofen x4 but states no relief in symptoms.  Patient states she ambulates at home with walker but worsening and states she is having difficulty ambulating at all nightly.  She denies any trauma, bowel or bladder incontinence, back pain, chest pain or shortness of breath.     Past Medical History:  Diagnosis Date  . Anemia   . Aortic valve stenosis, severe   . 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 Asthma   . Blood transfusion    2013[facility]  . Breast cancer in female [facility])    Right  . CAD (coronary artery disease)    Cath 2010 with DES x 1 RCA-- PT'S CARDIOLOGIST IS DR. Firebaugh  . Chronic diastolic congestive heart failure ([place])   . COPD (chronic obstructive pulmonary disease) ([place])   . Depression   . Diabetes mellitus DIAGNOSED IN2010   Controll s with diet  . Eczema    on back  . Hyperlipidemia   . Hypertension   . Kidney stones   . Morbid obesity with body mass index of 60.0-69.9 in adult [facility])   . Myocardial infarction ([place])    PT THINKS SHE WAS DX WITH MI AT THE TIME OF HEART STENTING  . Pulmonary embolism ([place]) 02/08/12   S/P RT TOTAL KNEE ON 02/03/12--ON 02/08/12--DEVELOPED ACUTE SOB AND CHEST PAIN--AND  DIAGNOSED WITH  PULMONARY EMBOLUS AND PNEUMONIA  . Restless leg syndrome   . Sleep apnea    uses 3 liters O2 at night   . Uterine fibroid    NO PROBLEMS AT PRESENT FROM THE FIBROIDS-STATES SHE IS POST MENOPAUSAL-LAST MENSES 2010 EXCEPT FOR EPISODE THIS YR OF BLEEDING RELATED TO FIBROIDS.  Marland Kitchen Weakness    BOTH HANDS - S/P BILATERAL CARPAL TUNNEL RELEASE--BUT STILL HAS WEAKNESS--OFTEN DROPS THINGS    Patient Active Problem List   Diagnosis Date Noted  . Pain of left hip joint 08/09/2018  . Malignant neoplasm of upper-outer quadrant of right breast in female, estrogen receptor positive ([place]) 06/10/2018  . Screening for colon cancer 03/12/2018  . Screening for breast cancer 03/12/2018  . Thumb pain 03/12/2018  . Restless leg syndrome 02/04/2018  . 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 Depression 08/06/2015  . 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 Hypertension associated with diabetes ([place])   . Aortic stenosis   . Morbid obesity with body mass index of 60.0-69.9 in adult [facility])   . Chronic diastolic congestive heart failure ([place])   . COPD (chronic obstructive pulmonary disease) ([place]) 09/21/2013  . Obstructive sleep apnea 09/21/2013  . Nocturnal hypoxemia 09/12/2013  .  Abnormal CT scan, chest 05/12/2013  . Hyperlipidemia 11/30/2012  . History of pulmonary embolus (PE) 07/09/2012  . Long term (current) use of anticoagulants 02/20/2012  . Normocytic anemia 02/08/2012  . Type 2 diabetes mellitus without complication, without long-term current use of insulin ([place]) 02/08/2012  . Asthma 02/08/2012  . GERD (gastroesophageal reflux disease)   . CAD (coronary artery disease) 12/08/2011    Past Surgical History:  Procedure Laterality Date  . BREAST LUMPECTOMY WITH RADIOACTIVE SEED AND SENTINEL LYMPH NODE BIOPSY Right 07/19/2018   Procedure: BREAST LUMPECTOMY WITH RADIOACTIVE SEED AND SENTINEL LYMPH NODE BIOPSY;  Surgeon: Alphonsa Overall, MD;  Location: [facility];  Service: General;  Laterality: Right;  . CARDIAC CATHETERIZATION    . CARPAL TUNNEL RELEASE     Bilateral  . CHOLECYSTECTOMY    . CORONARY ANGIOPLASTY     2010 has stent in place  . CYSTOSCOPY W/ RETROGRADES Right 09/21/2013   Procedure: CYSTOSCOPY WITH RIGHT RETROGRADE PYELOGRAM RIGHT DOUBLE J STENT ;  Surgeon: Fredricka Bonine, MD;  Location: [facility];  Service: Urology;  Laterality: Right;  . CYSTOSCOPY WITH URETEROSCOPY AND STENT PLACEMENT Right 10/25/2013   Procedure: CYSTOSCOPY RIGHT URETEROSCOPY HOLMIUM LASER LITHO AND STENT PLACEMENT;  Surgeon: Fredricka Bonine, MD;  Location[facility];  Service: Urology;  Laterality: Right;  . HERNIA REPAIR    . INTRAOPERATIVE TRANSESOPHAGEAL ECHOCARDIOGRAM N/A 12/12/2014   Procedure: INTRAOPERATIVE TRANSESOPHAGEAL ECHOCARDIOGRAM;  Surgeon: Burnell Blanks, MD;  Location: [facility] OR;  Service: Cardiovascular;  Laterality: N/A;  . JOINT REPLACEMENT     bil total knees  . KNEE ARTHROPLASTY  02/03/2012   Procedure: COMPUTER ASSISTED TOTAL KNEE ARTHROPLASTY;  Surgeon: Mcarthur Rossetti, MD;  Location: [place];  Service: Orthopedics;  Laterality: Right;  Right total knee arthroplasty  . LEFT AND RIGHT HEART CATHETERIZATION WITH CORONARY ANGIOGRAM N/A 03/17/2013   Procedure: LEFT AND RIGHT HEART CATHETERIZATION WITH CORONARY ANGIOGRAM;  Surgeon: Burnell Blanks, MD;  Location: [facility] CATH LAB;  Service: Cardiovascular;  Laterality: N/A;  . LEFT AND RIGHT HEART CATHETERIZATION WITH CORONARY/GRAFT ANGIOGRAM N/A 09/14/2014   Procedure: LEFT AND RIGHT HEART CATHETERIZATION WITH Beatrix Fetters;  Surgeon: Burnell Blanks, MD;  Location: [facility] CATH LAB;  Service: Cardiovascular;  Laterality: N/A;  . TEE WITHOUT CARDIOVERSION N/A 03/14/2013   Procedure: TRANSESOPHAGEAL ECHOCARDIOGRAM (TEE);  Surgeon: Lelon Perla, MD;  Location: [facility] ENDOSCOPY;  Service: Cardiovascular;  Laterality: N/A;  . TEE WITHOUT  CARDIOVERSION N/A 11/14/2014   Procedure: TRANSESOPHAGEAL ECHOCARDIOGRAM (TEE);  Surgeon: Lelon Perla, MD;  Location: [facility] ENDOSCOPY;  Service: Cardiovascular;  Laterality: N/A;  . TOTAL KNEE ARTHROPLASTY  09/10/2012   Procedure: TOTAL KNEE ARTHROPLASTY;  Surgeon: Mcarthur Rossetti, MD;  [facility];  Service: Orthopedics;  Laterality: Left;  . TOTAL KNEE REVISION Right 07/15/2013   Procedure: REVISION ARTHROPLASTY RIGHT KNEE;  Surgeon: Mcarthur Rossetti, MD;  [facility];  Service: Orthopedics;  Laterality: Right;  . TRANSCATHETER AORTIC VALVE REPLACEMENT, TRANSFEMORAL N/A 12/12/2014   Procedure: TRANSCATHETER AORTIC VALVE REPLACEMENT, TRANSFEMORAL;  Surgeon: Burnell Blanks, MD;  Location: [place];  Service: Cardiovascular;  Laterality: N/A;  . TRIGGER FINGER RELEASE  09/10/2012   Procedure: RELEASE TRIGGER FINGER/A-1 PULLEY;  Surgeon: Mcarthur Rossetti, MD;  [facility];  Service: Orthopedics;  Laterality: Right;  Right Ring Finger  . TUBAL LIGATION       OB History    Gravida  3   Para      Term  Preterm      AB  1   Living        SAB  1   TAB      Ectopic      Multiple      Live Births  2        Obstetric Comments  SVD x 2. SAB x 1.          Home Medications    Prior to Admission medications   Medication Sig Start Date End Date Taking? Authorizing Provider  albuterol (PROVENTIL HFA;VENTOLIN HFA) 108 (90 Base) MCG/ACT inhaler Inhale 2 puffs into the lungs every 4 (four) hours as needed for wheezing or shortness of breath. 03/08/18   Lorella Nimrod, MD  albuterol (PROVENTIL) (2.5 MG/3ML) 0.083% nebulizer solution INHALE 1 VIAL VIA NEBULIZER EVERY FOUR HOURS AS NEEDED FOR WHEEZING OR SHORTNESS OF BREATH 08/09/18   Lorella Nimrod, MD  Alirocumab (PRALUENT) 75 MG/ML SOPN Inject 75 mg into the skin every 14 (fourteen) days.     [provider]  aspirin EC 81 MG tablet Take 1 tablet (81 mg total) by mouth daily. 03/08/18    Lorella Nimrod, MD  atorvastatin (LIPITOR) 80 MG tablet Take 1 tablet (80 mg total) by mouth daily. 03/08/18   Lorella Nimrod, MD  Blood Glucose Monitoring Suppl (ACCU-CHEK AVIVA CONNECT) w/Device KIT 1 kit by Does not apply route 2 (two) times daily at 8 am and 10 pm. 07/07/18   Isabelle Course, MD  clopidogrel (PLAVIX) 75 MG tablet Take 1 tablet (75 mg total) by mouth every morning. 03/08/18   Lorella Nimrod, MD  DULoxetine (CYMBALTA) 30 MG capsule Take 1 capsule (30 mg total) by mouth 2 (two) times daily. 03/12/18 03/12/19  Lorella Nimrod, MD  ezetimibe (ZETIA) 10 MG tablet Take 1 tablet (10 mg total) by mouth daily. 03/08/18   Lorella Nimrod, MD  Fluticasone-Umeclidin-Vilant (TRELEGY ELLIPTA) 100-62.5-25 MCG/INH AEPB Inhale 1 puff into the lungs daily. 02/09/18   Forde Dandy, PharmD  furosemide (LASIX) 40 MG tablet TAKE 1 Tablet  BY MOUTH TWICE DAILY AS NEEDED FOR SWELLING 08/12/18   Lorella Nimrod, MD  gabapentin (NEURONTIN) 300 MG capsule TAKE 3 Capsules BY MOUTH EVERY NIGHT AT BEDTIME 08/09/18   Lorella Nimrod, MD  glucose blood (ACCU-CHEK AVIVA PLUS) test strip Use to test blood sugar 2 times daily. diag code E11.9. Non insulin dependent, A1C greater than 9. 07/13/18   Lorella Nimrod, MD  ibuprofen (ADVIL,MOTRIN) 200 MG tablet Take 400 mg by mouth every 6 (six) hours as needed for moderate pain.    [provider]  Insulin Pen Needle (PEN NEEDLES) 32G X 4 MM MISC 1 applicator by Does not apply route daily. 07/21/18 10/29/18  Isabelle Course, MD  Lancets (ACCU-CHEK SOFT [facility]) lancets Use as instructed 07/07/18   Lorella Nimrod, MD  liraglutide (VICTOZA) 18 MG/3ML SOPN Inject 0.1 mLs (0.6 mg total) into the skin daily for 7 days, THEN 0.2 mLs (1.2 mg total) daily. 07/22/18 08/28/18  Oda Kilts, MD  losartan (COZAAR) 100 MG tablet Take 1 tablet (100 mg total) by mouth daily. 03/08/18   Lorella Nimrod, MD  meloxicam (MOBIC) 15 MG tablet TAKE 1 TABLET BY MOUTH EVERY DAY FOR 7 DAYS - STOP CLOPIDOGREL FOR  THOSE 7 DAYS - THEN TAKE 1 TABLET DAILY ONLY IF NEEDED AS DIRECTED Patient taking differently: Take 15 mg by mouth daily as needed for pain 06/01/18   Lorella Nimrod, MD  metFORMIN (GLUMETZA) 1000 MG (  MOD) 24 hr tablet Take 1 tablet (1,000 mg total) by mouth 2 (two) times daily. 08/09/18   Lorella Nimrod, MD  metoprolol tartrate (LOPRESSOR) 25 MG tablet Take 1 tablet (25 mg total) by mouth 2 (two) times daily. 03/08/18   Lorella Nimrod, MD  NITROSTAT 0.4 MG SL tablet PLACE 1 TABLET UNDER TONGUE AS NEEDED FOR CHEST PAIN EVERY 5 MINUTES X 3 MAX DOSES.  CALL 911 IF PAIN PERSISTS 08/09/18   Lorella Nimrod, MD  OXYGEN Inhale 3 L into the lungs daily.    [provider]  potassium chloride SA (K-DUR,KLOR-CON) 20 MEQ tablet TAKE 2 Tablets BY MOUTH EVERY NIGHT AT BEDTIME DO NOT TAKE IF YOU DO NOT TAKE FUROSEMIDE 08/09/18   Lorella Nimrod, MD  Tetrahydrozoline HCl (VISINE OP) Place 2 drops into both eyes daily as needed (for dry eyes).    [provider]    Family History Family History  Problem Relation Age of Onset  . Breast cancer Mother        stage IV at diagnosis  . Emphysema Mother        smoked  . Heart disease Mother   . COPD Father        smoked  . Asthma Father   . Heart disease Father   . Cancer Brother        Sinus    Social History Social History   Tobacco Use  . Smoking status: Former Smoker    Packs/day: 1.50    Years: 30.00    Pack years: 45.00    Types: Cigarettes    Last attempt to quit: 12/29/2000    Years since quitting: 17.6  . Smokeless tobacco: Never Used  Substance Use Topics  . Alcohol use: Yes    Alcohol/week: 1.0 standard drinks    Types: 1 Cans of beer per week    Frequency: Never    Comment: rarely  . Drug use: No     Allergies   Patient has no known allergies.   Review of Systems Review of Systems  Constitutional: Negative for chills and fever.  HENT: Negative for ear pain and sore throat.   Eyes: Negative for pain and visual  disturbance.  Respiratory: Negative for cough and shortness of breath.   Cardiovascular: Negative for chest pain and palpitations.  Gastrointestinal: Negative for abdominal pain and vomiting.  Genitourinary: Negative for dysuria and hematuria.  Musculoskeletal: Positive for arthralgias. Negative for back pain, joint swelling and myalgias.  Skin: Negative for color change and rash.  Neurological: Negative for seizures and syncope.  All other systems reviewed and are negative.    Physical Exam Updated Vital Signs BP 137/74 (BP Location: Left Arm)   Pulse 98   Temp 98.1 F (36.7 C) (Oral)   Resp 18   LMP 02/03/2012   SpO2 100%   Physical Exam  Constitutional: She is oriented to person, place, and time. She appears well-developed and well-nourished. No distress.  HENT:  Head: Normocephalic and atraumatic.  Mouth/Throat: Oropharynx is clear and moist. No oropharyngeal exudate.  Eyes: Pupils are equal, round, and reactive to light.  Neck: Normal range of motion.  Cardiovascular: Regular rhythm and normal heart sounds.  Pulmonary/Chest: Effort normal and breath sounds normal. No respiratory distress.  Abdominal: Soft. Bowel sounds are normal. She exhibits no distension. There is no tenderness.  Musculoskeletal: She exhibits tenderness. She exhibits no deformity.       Left hip: She exhibits tenderness. She exhibits normal strength, no  swelling, no deformity and no laceration.       Right lower leg: She exhibits no edema.       Left lower leg: She exhibits no edema.       Legs: Pain is not reproducible with palpation, pulses are present.Antlagic gait.   Neurological: She is alert and oriented to person, place, and time.  Skin: Skin is warm and dry.  Psychiatric: She has a normal mood and affect.  Nursing note and vitals reviewed.    ED Treatments / Results  Labs (all labs ordered are listed, but only abnormal results are displayed) Labs Reviewed - No data to  display  EKG None  Radiology Dg Hip Unilat With Pelvis 2-3 Views Left  Result Date: 08/14/2018 CLINICAL DATA:  57 y/o  F; 1 week of left hip pain.  Denies injury. EXAM: DG HIP (WITH OR WITHOUT PELVIS) 2-3V LEFT COMPARISON:  06/10/2016 left hip radiographs FINDINGS: There is no evidence of hip fracture or dislocation. Mild right hip osteoarthrosis with productive changes of the superolateral acetabulum in superior loss of joint space. Stable mild osteoarthrosis of the left hip joint productive changes of the superolateral acetabulum and superior loss of joint space. 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 No acute osseous abnormality. Electronically Signed   By: Kristine Garbe M.D.   On: 08/14/2018 19:52    Procedures Procedures (including critical care time)  Medications Ordered in ED Medications - No data to display   Initial Impression / Assessment and Plan / ED Course  I have reviewed the triage vital signs and the nursing notes.  Pertinent labs & imaging results that were available during my care of the patient were reviewed by me and considered in my medical decision making (see chart for details).     Presents with left leg pain which began couple weeks ago.  She was scheduled for an x-ray but missed the appointment.  DG left hip showed mild osteoarthrosis of the hips, no dislocation, fracture.  Patient states she has a history of arthritis and believes this might be getting worse.  Have advised patient to follow-up with her PCP on Monday and continue to take meloxicam for the pain which has seemed to relieve some of her symptoms.  Patient agrees and understands the plan.  His vitals have been stable throughout ED visit.  Return precautions provided.  Final Clinical Impressions(s) / ED Diagnoses   Final diagnoses:  Left hip pain    ED Discharge Orders    None       Janeece Fitting, Hershal Coria 08/14/18 2034    Pattricia Boss, MD 08/20/18  1149

## 2018-08-14 NOTE — ED Notes (Signed)
Patient states she normally ambulates without assistance but could hardly transfer from wheelchair to recliner in room without assistance d/t severe pain in hip with movement.

## 2018-08-14 NOTE — ED Notes (Signed)
Patient able to ambulate independently  

## 2018-08-14 NOTE — Discharge Instructions (Addendum)
You may take meloxicam for pain.  You may rest elevate the left leg for comfort.  Please follow up with PCP in 1 week.If your Tums worsen or you experience any bowel or bladder incontinence, chest pain, shortness of breath please return to the ED.

## 2018-08-14 NOTE — ED Notes (Signed)
Patient normally on 3L O2 [place], placed on 3L in room.

## 2018-08-18 ENCOUNTER — Encounter: Payer: Self-pay | Admitting: Internal Medicine

## 2018-08-18 ENCOUNTER — Other Ambulatory Visit: Payer: Self-pay

## 2018-08-18 ENCOUNTER — Ambulatory Visit (INDEPENDENT_AMBULATORY_CARE_PROVIDER_SITE_OTHER): Payer: Medicaid Other | Admitting: Internal Medicine

## 2018-08-18 VITALS — BP 150/65 | HR 84 | Temp 97.8°F | Ht 60.0 in

## 2018-08-18 DIAGNOSIS — I110 Hypertensive heart disease with heart failure: Secondary | ICD-10-CM | POA: Diagnosis not present

## 2018-08-18 DIAGNOSIS — I350 Nonrheumatic aortic (valve) stenosis: Secondary | ICD-10-CM | POA: Diagnosis not present

## 2018-08-18 DIAGNOSIS — I5032 Chronic diastolic (congestive) heart failure: Secondary | ICD-10-CM

## 2018-08-18 DIAGNOSIS — M160 Bilateral primary osteoarthritis of hip: Secondary | ICD-10-CM | POA: Diagnosis not present

## 2018-08-18 DIAGNOSIS — I2510 Atherosclerotic heart disease of native coronary artery without angina pectoris: Secondary | ICD-10-CM

## 2018-08-18 DIAGNOSIS — E1159 Type 2 diabetes mellitus with other circulatory complications: Secondary | ICD-10-CM

## 2018-08-18 DIAGNOSIS — R110 Nausea: Secondary | ICD-10-CM

## 2018-08-18 DIAGNOSIS — C50411 Malignant neoplasm of upper-outer quadrant of right female breast: Secondary | ICD-10-CM

## 2018-08-18 DIAGNOSIS — Z170 Estrogen receptor positive status [ER+]: Secondary | ICD-10-CM

## 2018-08-18 DIAGNOSIS — J449 Chronic obstructive pulmonary disease, unspecified: Secondary | ICD-10-CM

## 2018-08-18 DIAGNOSIS — G4733 Obstructive sleep apnea (adult) (pediatric): Secondary | ICD-10-CM | POA: Diagnosis not present

## 2018-08-18 DIAGNOSIS — I10 Essential (primary) hypertension: Secondary | ICD-10-CM

## 2018-08-18 DIAGNOSIS — I152 Hypertension secondary to endocrine disorders: Secondary | ICD-10-CM

## 2018-08-18 DIAGNOSIS — M25552 Pain in left hip: Secondary | ICD-10-CM

## 2018-08-18 MED ORDER — HYDROCHLOROTHIAZIDE 25 MG PO TABS: 25.0000 mg | ORAL_TABLET | Freq: Every day | ORAL | 0 refills | Status: DC

## 2018-08-18 MED ORDER — HYDROCODONE-ACETAMINOPHEN 5-325 MG PO TABS: 1.0000 | ORAL_TABLET | Freq: Four times a day (QID) | ORAL | 0 refills | Status: DC | PRN

## 2018-08-18 MED ORDER — HYDROCODONE-ACETAMINOPHEN 5-325 MG PO TABS: 1.0000 | ORAL_TABLET | Freq: Four times a day (QID) | ORAL | 0 refills | Status: AC | PRN

## 2018-08-18 NOTE — Progress Notes (Deleted)
   CC: ***  HPI:  Ms.Telena Darnell [place] Ste[place] is a 57 y.o.   Past Medical History:  Diagnosis Date  . Anemia   . Aortic valve stenosis, severe   . 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 Asthma   . Blood transfusion    2013[facility]  . Breast cancer in female [facility])    Right  . CAD (coronary artery disease)    Cath 2010 with DES x 1 RCA-- PT'S CARDIOLOGIST IS DR. Avery  . Chronic diastolic congestive heart failure ([place])   . COPD (chronic obstructive pulmonary disease) ([place])   . Depression   . Diabetes mellitus DIAGNOSED IN2010   Controll s with diet  . Eczema    on back  . Hyperlipidemia   . Hypertension   . Kidney stones   . Morbid obesity with body mass index of 60.0-69.9 in adult [facility])   . Myocardial infarction ([place])    PT THINKS SHE WAS DX WITH MI AT THE TIME OF HEART STENTING  . Pulmonary embolism ([place]) 02/08/12   S/P RT TOTAL KNEE ON 02/03/12--ON 02/08/12--DEVELOPED ACUTE SOB AND CHEST PAIN--AND DIAGNOSED WITH  PULMONARY EMBOLUS AND PNEUMONIA  . Restless leg syndrome   . Sleep apnea    uses 3 liters O2 at night   . Uterine fibroid    NO PROBLEMS AT PRESENT FROM THE FIBROIDS-STATES SHE IS POST MENOPAUSAL-LAST MENSES 2010 EXCEPT FOR EPISODE THIS YR OF BLEEDING RELATED TO FIBROIDS.  Marland Kitchen Weakness    BOTH HANDS - S/P BILATERAL CARPAL TUNNEL RELEASE--BUT STILL HAS WEAKNESS--OFTEN DROPS THINGS   Review of Systems:  ***  Physical Exam:  There were no vitals filed for this visit. ***  Assessment & Plan:   See Encounters Tab for problem based charting.  Patient {GC/GE:[id number]::"discussed with","seen with"} Dr. {NAMES:[id number]::"Butcher","Granfortuna","E. Hoffman","Klima","Mullen","Narendra","Raines","Vincent"}

## 2018-08-18 NOTE — Patient Instructions (Addendum)
It was a pleasure to see you today [name]. I am sorry to hear about your hip pain. Please take norco every 6hrs as needed. Please also get your bone scan. Follow up in 2-4 weeks  Please also start taking hydrochlorothiazide along with your other medication for your blood pressure  If you have any questions or concerns, please call our clinic at [phone] between 9am-5pm and after hours call [phone] and ask for the internal medicine resident on call. If you feel you are having a medical emergency please call 911.   Thank you, we look forward to help you remain healthy!  Lars Mage, MD Internal Medicine PGY2

## 2018-08-18 NOTE — Progress Notes (Signed)
CC: Left hip pain  HPI:  [name] is a 57 y.o. female with coronary artery disease, aortic stenosis, malignant neoplasm of upper outer quadrant right breast that is ER +, PR+, HER-, chronic diastolic heart failure, hypertension associated with diabetes, asthma, COPD, OSA, type 2 diabetes mellitus who presents for left hip pain. Please see problem based charting for evaluation, assessment, and plan.  Past Medical History:  Diagnosis Date  . Anemia   . Aortic valve stenosis, severe   . 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 Asthma   . Blood transfusion    2013[facility]  . Breast cancer in female [facility])    Right  . CAD (coronary artery disease)    Cath 2010 with DES x 1 RCA-- PT'S CARDIOLOGIST IS DR. Hartley  . Chronic diastolic congestive heart failure ([place])   . COPD (chronic obstructive pulmonary disease) ([place])   . Depression   . Diabetes mellitus DIAGNOSED IN2010   Controll s with diet  . Eczema    on back  . Hyperlipidemia   . Hypertension   . Kidney stones   . Morbid obesity with body mass index of 60.0-69.9 in adult [facility])   . Myocardial infarction ([place])    PT THINKS SHE WAS DX WITH MI AT THE TIME OF HEART STENTING  . Pulmonary embolism ([place]) 02/08/12   S/P RT TOTAL KNEE ON 02/03/12--ON 02/08/12--DEVELOPED ACUTE SOB AND CHEST PAIN--AND DIAGNOSED WITH  PULMONARY EMBOLUS AND PNEUMONIA  . Restless leg syndrome   . Sleep apnea    uses 3 liters O2 at night   . Uterine fibroid    NO PROBLEMS AT PRESENT FROM THE FIBROIDS-STATES SHE IS POST MENOPAUSAL-LAST MENSES 2010 EXCEPT FOR EPISODE THIS YR OF BLEEDING RELATED TO FIBROIDS.  Marland Kitchen Weakness    BOTH HANDS - S/P BILATERAL CARPAL TUNNEL RELEASE--BUT STILL HAS WEAKNESS--OFTEN DROPS THINGS   Review of Systems:    Has nausea, pain on left hip Denies sob, chest pain, headaches, or blurry vision, headaches   Physical  Exam:  Vitals:   08/18/18 1405  BP: (!) 153/48  Pulse: 90  Temp: 97.8 F (36.6 C)  TempSrc: Oral  SpO2: 97%  Height: 5' (1.524 m)   Physical Exam  Constitutional: She appears well-developed and well-nourished. No distress.  HENT:  Head: Normocephalic and atraumatic.  Eyes: Conjunctivae are normal.  Cardiovascular: Normal rate, regular rhythm and normal heart sounds.  Respiratory: Effort normal and breath sounds normal. No respiratory distress. She has no wheezes.  GI: Soft. Bowel sounds are normal. She exhibits no distension.  Musculoskeletal: She exhibits tenderness (In the left lower back and anterior groin). She exhibits no edema.  Patient unable to lift the left leg due to pain. 5/5 strength in right lower extremity.  Sensation intact bilaterally  Neurological: She is alert.  Skin: She is not diaphoretic. No erythema.  Psychiatric: She has a normal mood and affect. Her behavior is normal. Judgment and thought content normal.    Assessment & Plan:   See Encounters Tab for problem based charting.  Left Hip Pain The patient presents for left hip pain that she has been having for the past two weeks. The pain is present over the anterior hip and radiates to her back. She rates the pain as 10/10 intensity, tearing/sharp in nature, radiates down left upper thigh down  to left knee, worsened with laying on her left hip or placing pressure on that side.  States that she also has accompanied numbness in her left anterior thigh to left knee.  She was seen for the pain 1 week ago and prescribed meloxicam daily as needed which she states has not been helping. Left hip x-ray was ordered (August 14, 2018) showed mild osteoarthritis of hip joints.  She recalls falling from a 10 feet distance on her left side. Since then she has had some discomfort, but has not been extremely bothersome.   Assessment and plan  Patient's left hip x-ray is consistent with osteoarthritic changes and there is no  presence of any acute fracture. The acute nature of the patient's severe left hip pain is concerning for possible avascular necrosis versus metastatic bone injury.  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 Genia Harold. She has not had bone scan imaging after she was diagnosed with breast cancer.   -Ordered bone scan of the whole body -Prescribed hydrocodone 5-325 mg every 6 hours as needed #20 -Follow-up in 2 to 4 weeks   Patient discussed with Dr. Lynnae January

## 2018-08-18 NOTE — Assessment & Plan Note (Addendum)
The patient presents for left hip pain that she has been having for the past two weeks. The pain is present over the anterior hip and radiates to her back. She rates the pain as 10/10 intensity, tearing/sharp in nature, radiates down left upper thigh down to left knee, worsened with laying on her left hip or placing pressure on that side.  States that she also has accompanied numbness in her left anterior thigh to left knee.  She was seen for the pain 1 week ago and prescribed meloxicam daily as needed which she states has not been helping. Left hip x-ray was ordered (August 14, 2018) showed mild osteoarthritis of hip joints.  She recalls falling from a 10 feet distance on her left side. Since then she has had some discomfort, but has not been extremely bothersome.   Assessment and plan  Patient's left hip x-ray is consistent with osteoarthritic changes and there is no presence of any acute fracture. The acute nature of the patient's severe left hip pain is concerning for possible avascular necrosis versus metastatic bone injury.  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 Genia Harold. She has not had bone scan imaging after she was diagnosed with breast cancer.   -Ordered bone scan of the whole body -Prescribed hydrocodone 5-325 mg every 6 hours as needed #20 -Follow-up in 2 to 4 weeks

## 2018-08-19 ENCOUNTER — Ambulatory Visit
Admission: RE | Admit: 2018-08-19 | Discharge: 2018-08-19 | Disposition: A | Discharge status: Home / Self Care | Payer: Medicaid Other | Source: Ambulatory Visit | Attending: Radiation Oncology | Admitting: Radiation Oncology

## 2018-08-19 DIAGNOSIS — Z170 Estrogen receptor positive status [ER+]: Secondary | ICD-10-CM | POA: Insufficient documentation

## 2018-08-19 DIAGNOSIS — C50411 Malignant neoplasm of upper-outer quadrant of right female breast: Secondary | ICD-10-CM | POA: Insufficient documentation

## 2018-08-19 DIAGNOSIS — Z510 Encounter for antineoplastic radiation therapy: Secondary | ICD-10-CM | POA: Diagnosis not present

## 2018-08-20 NOTE — Progress Notes (Signed)
Internal Medicine Clinic Attending  Case discussed with Dr. Maricela Bo at the time of the visit.  We reviewed the resident's history and exam and pertinent patient test results.  I agree with the assessment, diagnosis, and plan of care documented in the resident's note. Pt had sudden onset pain and known active malignancy. She needs imaging to R/O met to her hip.

## 2018-08-24 ENCOUNTER — Telehealth: Payer: Self-pay | Admitting: Student-PharmD

## 2018-08-24 NOTE — Telephone Encounter (Signed)
Patient PA for Praluent 150 mg/ml denied through Medicaid. PA denied based on her LDL being 84 mg/dL which is <130 mg/dL. Her LDL is 84 mg/dl because patient has already been on Praluent 75 mg/ml. Before obtaining Medicaid coverage, patient had been receiving Praluent 75 mg/ml from the manufacturer through patient assistance. Therefore her LDL is 84 mg/dL because she had been on Praluent 75 mg/ml for 2 months before labs were drawn. The PA was submitted to Medicaid for Praluent 150 mg/ml due to patient's LDL not at goal of <70 mg/dL. Will resubmit PA to Medicaid including this information.

## 2018-08-24 NOTE — Telephone Encounter (Signed)
Agree with plan as documented.

## 2018-08-26 ENCOUNTER — Encounter (HOSPITAL_COMMUNITY)
Admission: RE | Admit: 2018-08-26 | Discharge: 2018-08-26 | Disposition: A | Discharge status: Home / Self Care | Payer: Medicaid Other | Source: Ambulatory Visit | Attending: Internal Medicine | Admitting: Internal Medicine

## 2018-08-26 DIAGNOSIS — Z510 Encounter for antineoplastic radiation therapy: Secondary | ICD-10-CM | POA: Diagnosis not present

## 2018-08-26 DIAGNOSIS — M25552 Pain in left hip: Secondary | ICD-10-CM | POA: Diagnosis present

## 2018-08-26 IMAGING — NM NM BONE WHOLE BODY
2 series · 2 of 2 positions shown · non-contrast
Comparison: None

CLINICAL DATA: LEFT hip pain radiating down to LEFT knee
chronically, history of chronic bronchitis, COPD, diabetes mellitus,
hypertension, new diagnosis of breast cancer 1 month ago

EXAM:
NUCLEAR MEDICINE WHOLE BODY BONE SCAN
TECHNIQUE: Whole body anterior and posterior images were obtained approximately
3 hours after intravenous injection of radiopharmaceutical.
RADIOPHARMACEUTICALS:  20.0 mCi [06] MDP IV

[Series 1: whole body · 2.66mm/px · 1 of 1 slices shown (1 of 2)]
[im 1/1]
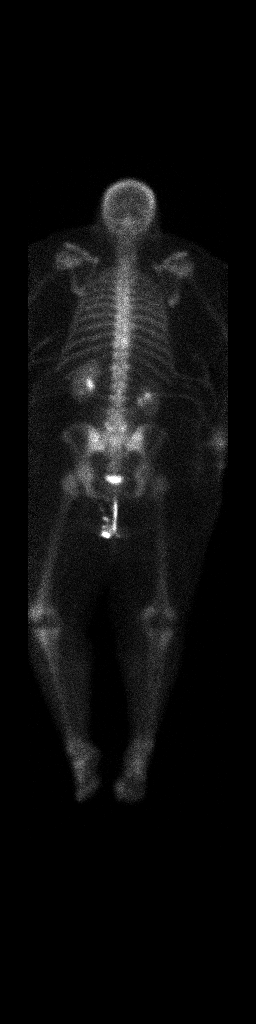

[Series 1: whole body · 2.66mm/px · 1 of 1 slices shown (2 of 2)]
[im 1/1]
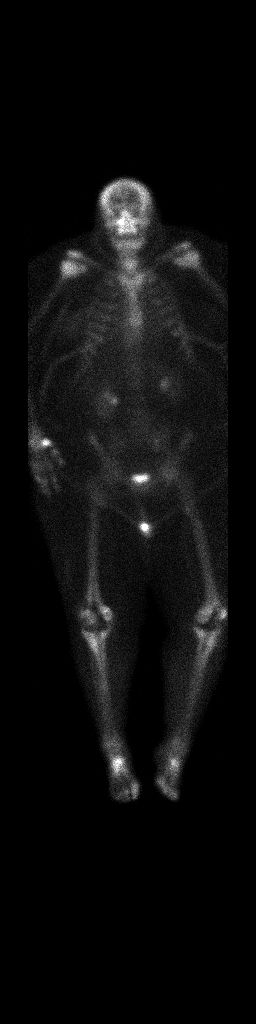

[2 of 2 positions shown; findings below may reference images not displayed]

Radiographic correlation: Pelvic/LEFT hip radiographs [DATE], CT
abdomen/pelvis [DATE]
FINDINGS: Photopenic defects at both knees from knee prostheses.

Photopenic defect identified at the lateral distal RIGHT femoral
metaphysis, of uncertain etiology; this could be related to atypical
hardware placement, lytic bone lesion, or other etiology; recommend
radiographic correlation.

Mild uptake at the shoulders, feet, and RIGHT wrist, typically
degenerative.

Questionable focus of abnormal increased tracer accumulation in the
lower thoracic spine on RIGHT at T9-T10; patient has facet
degenerative changes at this site on CT.

Mild uptake bilaterally at the lower lumbar spine at L5-S1,
typically degenerative.

No additional sites of worrisome tracer accumulation are identified
to suggest loosening or infection.

Expected urinary tract and soft tissue distribution of tracer.
IMPRESSION: Scattered degenerative type uptake as above.

Prior BILATERAL knee replacement surgeries with a photopenic defect
at the distal RIGHT femoral metaphysis laterally, of uncertain
etiology; this could be related to atypical hardware, lytic lesion,
or other etiology and radiographic correlation is recommended.

## 2018-08-26 MED ORDER — TECHNETIUM TC 99M MEDRONATE IV KIT
20.0000 | PACK | Freq: Once | INTRAVENOUS | Status: AC | PRN
  Administered 2018-08-26: 20 via INTRAVENOUS

## 2018-08-26 NOTE — Telephone Encounter (Signed)
Again denied through Medicaid. LMOM to notify pt will need to initiate appeals process.

## 2018-08-27 ENCOUNTER — Telehealth: Payer: Self-pay

## 2018-08-27 ENCOUNTER — Ambulatory Visit
Admission: RE | Admit: 2018-08-27 | Discharge: 2018-08-27 | Disposition: A | Discharge status: Home / Self Care | Payer: Medicaid Other | Source: Ambulatory Visit | Attending: Radiation Oncology | Admitting: Radiation Oncology

## 2018-08-27 DIAGNOSIS — Z510 Encounter for antineoplastic radiation therapy: Secondary | ICD-10-CM | POA: Diagnosis not present

## 2018-08-27 NOTE — Telephone Encounter (Signed)
Requesting ultrasound results. Please call pt back.

## 2018-08-31 ENCOUNTER — Telehealth: Payer: Self-pay | Admitting: Internal Medicine

## 2018-08-31 ENCOUNTER — Ambulatory Visit: Payer: Medicaid Other

## 2018-08-31 NOTE — Telephone Encounter (Signed)
Called the patient and discussed hip x-ray results which just showed mild osteoarthritis. Recent bone scan was also negative for any lesions in her left hip. She has a recent diagnosis of node positive breast cancer, starting her radiation this week. Also patient to come to [facility] if her pain continued to get worse for further evaluation.

## 2018-08-31 NOTE — Telephone Encounter (Signed)
Pt aware and will bring paperwork to start appeals to office once received.

## 2018-08-31 NOTE — Telephone Encounter (Signed)
Pt is calling results from her hip xray; pt contact# [phone]

## 2018-09-01 ENCOUNTER — Ambulatory Visit
Admission: RE | Admit: 2018-09-01 | Discharge: 2018-09-01 | Disposition: A | Discharge status: Home / Self Care | Payer: Medicaid Other | Source: Ambulatory Visit | Attending: Radiation Oncology | Admitting: Radiation Oncology

## 2018-09-01 DIAGNOSIS — Z170 Estrogen receptor positive status [ER+]: Secondary | ICD-10-CM | POA: Insufficient documentation

## 2018-09-01 DIAGNOSIS — C50411 Malignant neoplasm of upper-outer quadrant of right female breast: Secondary | ICD-10-CM | POA: Diagnosis not present

## 2018-09-01 DIAGNOSIS — Z510 Encounter for antineoplastic radiation therapy: Secondary | ICD-10-CM | POA: Insufficient documentation

## 2018-09-02 ENCOUNTER — Ambulatory Visit
Admission: RE | Admit: 2018-09-02 | Discharge: 2018-09-02 | Disposition: A | Discharge status: Home / Self Care | Payer: Medicaid Other | Source: Ambulatory Visit | Attending: Radiation Oncology | Admitting: Radiation Oncology

## 2018-09-02 DIAGNOSIS — Z510 Encounter for antineoplastic radiation therapy: Secondary | ICD-10-CM | POA: Diagnosis not present

## 2018-09-03 ENCOUNTER — Ambulatory Visit
Admission: RE | Admit: 2018-09-03 | Discharge: 2018-09-03 | Disposition: A | Discharge status: Home / Self Care | Payer: Medicaid Other | Source: Ambulatory Visit | Attending: Radiation Oncology | Admitting: Radiation Oncology

## 2018-09-03 DIAGNOSIS — Z510 Encounter for antineoplastic radiation therapy: Secondary | ICD-10-CM | POA: Diagnosis not present

## 2018-09-06 ENCOUNTER — Ambulatory Visit
Admission: RE | Admit: 2018-09-06 | Discharge: 2018-09-06 | Disposition: A | Discharge status: Home / Self Care | Payer: Medicaid Other | Source: Ambulatory Visit | Attending: Radiation Oncology | Admitting: Radiation Oncology

## 2018-09-06 DIAGNOSIS — Z510 Encounter for antineoplastic radiation therapy: Secondary | ICD-10-CM | POA: Diagnosis not present

## 2018-09-06 NOTE — Progress Notes (Signed)
  Radiation Oncology         [phone] ________________________________  Name: [name] MRN: [id number]  Date: 08/19/2018  DOB: 03/06/1961  Optical Surface Tracking Plan:  Since intensity modulated radiotherapy (IMRT) and 3D conformal radiation treatment methods are predicated on accurate and precise positioning for treatment, intrafraction motion monitoring is medically necessary to ensure accurate and safe treatment delivery.  The ability to quantify intrafraction motion without excessive ionizing radiation dose can only be performed with optical surface tracking. Accordingly, surface imaging offers the opportunity to obtain 3D measurements of patient position throughout IMRT and 3D treatments without excessive radiation exposure.  I am ordering optical surface tracking for this patient's upcoming course of radiotherapy. ________________________________  Kyung Rudd, MD 09/06/2018 11:35 AM    Reference:   Ursula Alert, J, et al. Surface imaging-based analysis of intrafraction motion for breast radiotherapy patients.Journal of [place], n. 6, nov. 2014. ISSN [id number].   Available at: <http://www.jacmp.org/index.php/jacmp/article/view/4957>.

## 2018-09-06 NOTE — Progress Notes (Signed)
  Radiation Oncology         [phone] ________________________________  Name: [name] MRN: [id number]  Date: 08/19/2018  DOB: [birth year]  DIAGNOSIS:     ICD-10-CM   1. Malignant neoplasm of upper-outer quadrant of right breast in female, estrogen receptor positive ([place]) C50.411    Z17.0      SIMULATION AND TREATMENT PLANNING NOTE  The patient presented for simulation prior to beginning her course of radiation treatment for her diagnosis of right-sided breast cancer. The patient was placed in a supine position on a breast board. A prone breast board was designed and this treatment device will be used on a daily basis during her treatment. In this fashion, a CT scan was obtained through the chest area and an isocenter was placed near the chest wall within the breast.  The patient will be planned to receive a course of radiation initially to a dose of 50.4 Gy. This will consist of a whole breast radiotherapy technique. To accomplish this, 2 customized blocks have been designed which will correspond to medial and lateral whole breast tangent fields. This treatment will be accomplished at 1.8 Gy per fraction. A forward planning technique will also be evaluated to determine if this approach improves the plan. It is anticipated that the patient will then receive a 10 Gy boost to the seroma cavity which has been contoured. This will be accomplished at 2 Gy per fraction.   This initial treatment will consist of a 3-D conformal technique. The seroma has been contoured as the primary target structure. Additionally, dose volume histograms of both this target as well as the lungs and heart will also be evaluated. Such an approach is necessary to ensure that the target area is adequately covered while the nearby critical normal structures are adequately spared.  Plan:  The final anticipated total dose therefore will correspond to 60.4 Gy.    _______________________________   Jodelle Gross, MD, PhD

## 2018-09-07 ENCOUNTER — Ambulatory Visit
Admission: RE | Admit: 2018-09-07 | Discharge: 2018-09-07 | Disposition: A | Discharge status: Home / Self Care | Payer: Medicaid Other | Source: Ambulatory Visit | Attending: Radiation Oncology | Admitting: Radiation Oncology

## 2018-09-07 DIAGNOSIS — Z510 Encounter for antineoplastic radiation therapy: Secondary | ICD-10-CM | POA: Diagnosis not present

## 2018-09-08 ENCOUNTER — Ambulatory Visit
Admission: RE | Admit: 2018-09-08 | Discharge: 2018-09-08 | Disposition: A | Discharge status: Home / Self Care | Payer: Medicaid Other | Source: Ambulatory Visit | Attending: Radiation Oncology | Admitting: Radiation Oncology

## 2018-09-08 DIAGNOSIS — Z510 Encounter for antineoplastic radiation therapy: Secondary | ICD-10-CM | POA: Diagnosis not present

## 2018-09-09 ENCOUNTER — Ambulatory Visit
Admission: RE | Admit: 2018-09-09 | Discharge: 2018-09-09 | Disposition: A | Discharge status: Home / Self Care | Payer: Medicaid Other | Source: Ambulatory Visit | Attending: Radiation Oncology | Admitting: Radiation Oncology

## 2018-09-09 DIAGNOSIS — Z510 Encounter for antineoplastic radiation therapy: Secondary | ICD-10-CM | POA: Diagnosis not present

## 2018-09-10 ENCOUNTER — Ambulatory Visit
Admission: RE | Admit: 2018-09-10 | Discharge: 2018-09-10 | Disposition: A | Discharge status: Home / Self Care | Payer: Medicaid Other | Source: Ambulatory Visit | Attending: Radiation Oncology | Admitting: Radiation Oncology

## 2018-09-10 DIAGNOSIS — Z510 Encounter for antineoplastic radiation therapy: Secondary | ICD-10-CM | POA: Diagnosis not present

## 2018-09-10 NOTE — Telephone Encounter (Signed)
Paperwork received, request for appeals faxed to [facility]

## 2018-09-13 ENCOUNTER — Ambulatory Visit
Admission: RE | Admit: 2018-09-13 | Discharge: 2018-09-13 | Disposition: A | Discharge status: Home / Self Care | Payer: Medicaid Other | Source: Ambulatory Visit | Attending: Radiation Oncology | Admitting: Radiation Oncology

## 2018-09-13 ENCOUNTER — Encounter: Payer: Medicaid Other | Admitting: Internal Medicine

## 2018-09-13 DIAGNOSIS — Z510 Encounter for antineoplastic radiation therapy: Secondary | ICD-10-CM | POA: Diagnosis not present

## 2018-09-14 ENCOUNTER — Ambulatory Visit
Admission: RE | Admit: 2018-09-14 | Discharge: 2018-09-14 | Disposition: A | Discharge status: Home / Self Care | Payer: Medicaid Other | Source: Ambulatory Visit | Attending: Radiation Oncology | Admitting: Radiation Oncology

## 2018-09-14 DIAGNOSIS — Z510 Encounter for antineoplastic radiation therapy: Secondary | ICD-10-CM | POA: Diagnosis not present

## 2018-09-15 ENCOUNTER — Other Ambulatory Visit: Payer: Self-pay | Admitting: Internal Medicine

## 2018-09-15 ENCOUNTER — Ambulatory Visit
Admission: RE | Admit: 2018-09-15 | Discharge: 2018-09-15 | Disposition: A | Discharge status: Home / Self Care | Payer: Medicaid Other | Source: Ambulatory Visit | Attending: Radiation Oncology | Admitting: Radiation Oncology

## 2018-09-15 DIAGNOSIS — Z510 Encounter for antineoplastic radiation therapy: Secondary | ICD-10-CM | POA: Diagnosis not present

## 2018-09-15 DIAGNOSIS — I350 Nonrheumatic aortic (valve) stenosis: Secondary | ICD-10-CM

## 2018-09-15 DIAGNOSIS — I5032 Chronic diastolic (congestive) heart failure: Secondary | ICD-10-CM

## 2018-09-15 DIAGNOSIS — I2510 Atherosclerotic heart disease of native coronary artery without angina pectoris: Secondary | ICD-10-CM

## 2018-09-15 NOTE — Telephone Encounter (Signed)
Last appt with Dr Reesa Chew was 08/09/18.

## 2018-09-15 NOTE — Telephone Encounter (Signed)
Medicaid called back with appeals time and date. The appeals is scheduled for Monday 9/23 at 10:30am. Will need to call [phone] ext [phone] to speak with Medicaid appeals department.

## 2018-09-15 NOTE — Telephone Encounter (Signed)
100 tabs nitrostat sent 08/10/2018. Confirmed with patient she is not out of this med. States she also does not need potassium but does request refill on meloxicam.

## 2018-09-16 ENCOUNTER — Ambulatory Visit
Admission: RE | Admit: 2018-09-16 | Discharge: 2018-09-16 | Disposition: A | Discharge status: Home / Self Care | Payer: Medicaid Other | Source: Ambulatory Visit | Attending: Radiation Oncology | Admitting: Radiation Oncology

## 2018-09-16 DIAGNOSIS — Z510 Encounter for antineoplastic radiation therapy: Secondary | ICD-10-CM | POA: Diagnosis not present

## 2018-09-17 ENCOUNTER — Ambulatory Visit
Admission: RE | Admit: 2018-09-17 | Discharge: 2018-09-17 | Disposition: A | Discharge status: Home / Self Care | Payer: Medicaid Other | Source: Ambulatory Visit | Attending: Radiation Oncology | Admitting: Radiation Oncology

## 2018-09-17 DIAGNOSIS — Z510 Encounter for antineoplastic radiation therapy: Secondary | ICD-10-CM | POA: Diagnosis not present

## 2018-09-20 ENCOUNTER — Ambulatory Visit
Admission: RE | Admit: 2018-09-20 | Discharge: 2018-09-20 | Disposition: A | Discharge status: Home / Self Care | Payer: Medicaid Other | Source: Ambulatory Visit | Attending: Radiation Oncology | Admitting: Radiation Oncology

## 2018-09-20 DIAGNOSIS — Z510 Encounter for antineoplastic radiation therapy: Secondary | ICD-10-CM | POA: Diagnosis not present

## 2018-09-21 ENCOUNTER — Ambulatory Visit
Admission: RE | Admit: 2018-09-21 | Discharge: 2018-09-21 | Disposition: A | Discharge status: Home / Self Care | Payer: Medicaid Other | Source: Ambulatory Visit | Attending: Radiation Oncology | Admitting: Radiation Oncology

## 2018-09-21 DIAGNOSIS — Z510 Encounter for antineoplastic radiation therapy: Secondary | ICD-10-CM | POA: Diagnosis not present

## 2018-09-21 MED ORDER — ALIROCUMAB 150 MG/ML ~~LOC~~ SOPN: 150.0000 mg | PEN_INJECTOR | SUBCUTANEOUS | 11 refills | Status: DC

## 2018-09-21 NOTE — Telephone Encounter (Signed)
Appeals from yesterday was rescheduled to today. Appeals approved for Praluent 150mg  Q14 days. Appeals should be approved for 1 year.   [facility]

## 2018-09-21 NOTE — Telephone Encounter (Addendum)
Spoke with patient about approval. She will complete her supply of Praluent 75mg  as she just got another month from the drug company since we were awaiting approval through Medicaid. She will call once she has started the higher strength of 150mg  so that labs can be ordered to ensure this brings LDL to goal. She is also aware that the [facility] will be calling to set up shipment.

## 2018-09-21 NOTE — Addendum Note (Signed)
Addended by: Erskine Emery on: 09/21/2018 11:28 AM   Modules accepted: Orders

## 2018-09-22 ENCOUNTER — Ambulatory Visit
Admission: RE | Admit: 2018-09-22 | Discharge: 2018-09-22 | Disposition: A | Discharge status: Home / Self Care | Payer: Medicaid Other | Source: Ambulatory Visit | Attending: Radiation Oncology | Admitting: Radiation Oncology

## 2018-09-22 DIAGNOSIS — Z510 Encounter for antineoplastic radiation therapy: Secondary | ICD-10-CM | POA: Diagnosis not present

## 2018-09-23 ENCOUNTER — Ambulatory Visit
Admission: RE | Admit: 2018-09-23 | Discharge: 2018-09-23 | Disposition: A | Discharge status: Home / Self Care | Payer: Medicaid Other | Source: Ambulatory Visit | Attending: Radiation Oncology | Admitting: Radiation Oncology

## 2018-09-23 DIAGNOSIS — Z510 Encounter for antineoplastic radiation therapy: Secondary | ICD-10-CM | POA: Diagnosis not present

## 2018-09-24 ENCOUNTER — Ambulatory Visit: Payer: Medicaid Other

## 2018-09-24 ENCOUNTER — Ambulatory Visit: Admission: RE | Admit: 2018-09-24 | Payer: Medicaid Other | Source: Ambulatory Visit

## 2018-09-27 ENCOUNTER — Ambulatory Visit (INDEPENDENT_AMBULATORY_CARE_PROVIDER_SITE_OTHER): Payer: Medicaid Other | Admitting: Internal Medicine

## 2018-09-27 ENCOUNTER — Ambulatory Visit
Admission: RE | Admit: 2018-09-27 | Discharge: 2018-09-27 | Disposition: A | Discharge status: Home / Self Care | Payer: Medicaid Other | Source: Ambulatory Visit | Attending: Radiation Oncology | Admitting: Radiation Oncology

## 2018-09-27 ENCOUNTER — Encounter (INDEPENDENT_AMBULATORY_CARE_PROVIDER_SITE_OTHER): Payer: Self-pay

## 2018-09-27 ENCOUNTER — Encounter: Payer: Self-pay | Admitting: Internal Medicine

## 2018-09-27 ENCOUNTER — Other Ambulatory Visit: Payer: Self-pay

## 2018-09-27 VITALS — BP 145/74 | HR 93 | Ht 60.0 in | Wt 333.7 lb

## 2018-09-27 DIAGNOSIS — Z79899 Other long term (current) drug therapy: Secondary | ICD-10-CM

## 2018-09-27 DIAGNOSIS — Z7984 Long term (current) use of oral hypoglycemic drugs: Secondary | ICD-10-CM | POA: Diagnosis not present

## 2018-09-27 DIAGNOSIS — J441 Chronic obstructive pulmonary disease with (acute) exacerbation: Secondary | ICD-10-CM

## 2018-09-27 DIAGNOSIS — Z170 Estrogen receptor positive status [ER+]: Secondary | ICD-10-CM

## 2018-09-27 DIAGNOSIS — J449 Chronic obstructive pulmonary disease, unspecified: Secondary | ICD-10-CM | POA: Diagnosis not present

## 2018-09-27 DIAGNOSIS — I10 Essential (primary) hypertension: Secondary | ICD-10-CM

## 2018-09-27 DIAGNOSIS — Z96653 Presence of artificial knee joint, bilateral: Secondary | ICD-10-CM

## 2018-09-27 DIAGNOSIS — Z9981 Dependence on supplemental oxygen: Secondary | ICD-10-CM

## 2018-09-27 DIAGNOSIS — E1159 Type 2 diabetes mellitus with other circulatory complications: Secondary | ICD-10-CM

## 2018-09-27 DIAGNOSIS — Z23 Encounter for immunization: Secondary | ICD-10-CM | POA: Diagnosis present

## 2018-09-27 DIAGNOSIS — C50911 Malignant neoplasm of unspecified site of right female breast: Secondary | ICD-10-CM | POA: Diagnosis not present

## 2018-09-27 DIAGNOSIS — I152 Hypertension secondary to endocrine disorders: Secondary | ICD-10-CM

## 2018-09-27 DIAGNOSIS — Z510 Encounter for antineoplastic radiation therapy: Secondary | ICD-10-CM | POA: Diagnosis not present

## 2018-09-27 DIAGNOSIS — E119 Type 2 diabetes mellitus without complications: Secondary | ICD-10-CM

## 2018-09-27 DIAGNOSIS — M25552 Pain in left hip: Secondary | ICD-10-CM

## 2018-09-27 MED ORDER — GABAPENTIN 300 MG PO CAPS: 900.0000 mg | ORAL_CAPSULE | Freq: Every day | ORAL | 1 refills | Status: DC

## 2018-09-27 NOTE — Assessment & Plan Note (Signed)
She did bring her glucometer which shows an average glucose of 169, highest at 215 and lowest at 118 with a pretty good control and not much variations.  She ran out of her metformin for 2 weeks, restarted 1 week ago, during that timeframe she was having some few readings in low 200s. Blood sugar readings improved after restarting metformin.  We will continue metformin and Victoza.  Follow-up in 49-month.

## 2018-09-27 NOTE — Assessment & Plan Note (Signed)
She is compliant with her oxygen use and denies any acute exacerbation.

## 2018-09-27 NOTE — Assessment & Plan Note (Signed)
BP Readings from Last 3 Encounters:  09/27/18 (!) 145/74  08/18/18 (!) 150/65  08/14/18 137/74   Her blood pressure was little elevated. She just came from her radiation appointment. Having pain in her breast following radiation and left hip.  We will continue with current management of losartan, HCTZ and Lasix.

## 2018-09-27 NOTE — Progress Notes (Signed)
   CC: For follow-up of her diabetes, hypertension and left hip pain.  HPI:  [name] is a 57 y.o. with past medical history as listed below came to the clinic for follow-up of diabetes, hypertension and left hip pain.  Patient is undergoing radiation treatment for her ER, PR positive breast cancer, feeling tired due to radiation.  she continued to have left hip pain with no relief, stating that none of the pain meds is working.  The see assessment and plan for her chronic conditions.  Past Medical History:  Diagnosis Date  . Anemia   . Aortic valve stenosis, severe   . 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 Asthma   . Blood transfusion    [facility]  . Breast cancer in female [facility])    Right  . CAD (coronary artery disease)    Cath 2010 with DES x 1 RCA-- PT'S CARDIOLOGIST IS DR. Stoy  . Chronic diastolic congestive heart failure ([place])   . COPD (chronic obstructive pulmonary disease) ([place])   . Depression   . Diabetes mellitus DIAGNOSED IN2010   Controll s with diet  . Eczema    on back  . Hyperlipidemia   . Hypertension   . Kidney stones   . Morbid obesity with body mass index of 60.0-69.9 in adult [facility])   . Myocardial infarction ([place])    PT THINKS SHE WAS DX WITH MI AT THE TIME OF HEART STENTING  . Pulmonary embolism ([place]) 02/08/12   S/P RT TOTAL KNEE ON 02/03/12--ON 02/08/12--DEVELOPED ACUTE SOB AND CHEST PAIN--AND DIAGNOSED WITH  PULMONARY EMBOLUS AND PNEUMONIA  . Restless leg syndrome   . Sleep apnea    uses 3 liters O2 at night   . Uterine fibroid    NO PROBLEMS AT PRESENT FROM THE FIBROIDS-STATES SHE IS POST MENOPAUSAL-LAST MENSES 2010 EXCEPT FOR EPISODE THIS YR OF BLEEDING RELATED TO FIBROIDS.  Marland Kitchen Weakness    BOTH HANDS - S/P BILATERAL CARPAL TUNNEL RELEASE--BUT STILL HAS WEAKNESS--OFTEN DROPS THINGS   Review of Systems: Negative except  mentioned in HPI.  Physical Exam:  Vitals:   09/27/18 1554  BP: (!) 145/74  Pulse: 93  SpO2: 98%  Weight: (!) 333 lb 11.2 oz (151.4 kg)  Height: 5' (1.524 m)   General: Vital signs reviewed.  Patient is well-developed, orbitally obese, using oxygen through nasal cannula, in no acute distress and cooperative with exam.  Head: Normocephalic and atraumatic. Cardiovascular: RRR, S1 normal, S2 normal, no murmurs, gallops, or rubs. Pulmonary/Chest: Clear to auscultation bilaterally, no wheezes, rales, or rhonchi. Abdominal: Soft, non-tender, non-distended, BS +,  Extremities: No lower extremity edema bilaterally,  pulses symmetric and intact bilaterally. No cyanosis or clubbing. Skin: Warm, dry and intact. No rashes or erythema. Psychiatric: Normal mood and affect. speech and behavior is normal. Cognition and memory are normal.  Assessment & Plan:   See Encounters Tab for problem based charting.  Patient discussed with Dr. Dareen Piano.

## 2018-09-27 NOTE — Assessment & Plan Note (Signed)
Patient continued to experience left hip pain and unable to walk despite using conservative measures and pain medicine. She was having pain on lateral hip and buttock area. Might be due to piriformis syndrome, but no resolution or improvement after more than 3 months of conservative measures.  Bone scan done last month does not show any increased uptake in left hip area, there was some in the right proximal femoral area.  Patient had bilateral knee replacement.  We will try to obtain CT scan with contrast of left hip. Also advised the patient to discuss this pain with her oncologist and might need a PET scan. She does not want a new prescription of pain medicine, stating that she does not want to take any pain medicine as it was not helping.

## 2018-09-27 NOTE — Patient Instructions (Signed)
Thank you for visiting clinic today. I ordered a CT scan of left hip to find out the cause of your pain. please discussed this pain with your oncologist. I am not making any changes to your current medications for high blood pressure and diabetes, continue taking them as you were taking it before. Follow-up in 64-month or earlier if needed.

## 2018-09-28 ENCOUNTER — Ambulatory Visit
Admission: RE | Admit: 2018-09-28 | Discharge: 2018-09-28 | Disposition: A | Discharge status: Home / Self Care | Payer: Medicaid Other | Source: Ambulatory Visit | Attending: Radiation Oncology | Admitting: Radiation Oncology

## 2018-09-28 DIAGNOSIS — C50411 Malignant neoplasm of upper-outer quadrant of right female breast: Secondary | ICD-10-CM | POA: Insufficient documentation

## 2018-09-28 DIAGNOSIS — Z510 Encounter for antineoplastic radiation therapy: Secondary | ICD-10-CM | POA: Diagnosis not present

## 2018-09-28 DIAGNOSIS — Z170 Estrogen receptor positive status [ER+]: Secondary | ICD-10-CM | POA: Diagnosis not present

## 2018-09-29 ENCOUNTER — Ambulatory Visit
Admission: RE | Admit: 2018-09-29 | Discharge: 2018-09-29 | Disposition: A | Discharge status: Home / Self Care | Payer: Medicaid Other | Source: Ambulatory Visit | Attending: Radiation Oncology | Admitting: Radiation Oncology

## 2018-09-29 DIAGNOSIS — Z510 Encounter for antineoplastic radiation therapy: Secondary | ICD-10-CM | POA: Diagnosis not present

## 2018-09-30 ENCOUNTER — Ambulatory Visit
Admission: RE | Admit: 2018-09-30 | Discharge: 2018-09-30 | Disposition: A | Discharge status: Home / Self Care | Payer: Medicaid Other | Source: Ambulatory Visit | Attending: Radiation Oncology | Admitting: Radiation Oncology

## 2018-09-30 DIAGNOSIS — Z510 Encounter for antineoplastic radiation therapy: Secondary | ICD-10-CM | POA: Diagnosis not present

## 2018-09-30 NOTE — Progress Notes (Signed)
Internal Medicine Clinic Attending  Case discussed with Dr. Amin at the time of the visit.  We reviewed the resident's history and exam and pertinent patient test results.  I agree with the assessment, diagnosis, and plan of care documented in the resident's note.    

## 2018-10-01 ENCOUNTER — Other Ambulatory Visit: Payer: Self-pay | Admitting: Cardiology

## 2018-10-01 ENCOUNTER — Ambulatory Visit
Admission: RE | Admit: 2018-10-01 | Discharge: 2018-10-01 | Disposition: A | Discharge status: Home / Self Care | Payer: Medicaid Other | Source: Ambulatory Visit | Attending: Radiation Oncology | Admitting: Radiation Oncology

## 2018-10-01 ENCOUNTER — Telehealth: Payer: Self-pay

## 2018-10-01 DIAGNOSIS — Z510 Encounter for antineoplastic radiation therapy: Secondary | ICD-10-CM | POA: Diagnosis not present

## 2018-10-01 MED ORDER — AMOXICILLIN 500 MG PO CAPS: 2000.0000 mg | ORAL_CAPSULE | Freq: Once | ORAL | 0 refills | Status: AC

## 2018-10-01 NOTE — Telephone Encounter (Signed)
   [facility] Pre-operative Risk Assessment    Request for surgical clearance:  1. What type of surgery is being performed?  Extraction of remaining teeth and alveoloplasty under IV sedation ( versed and nitrous only)  2. When is this surgery scheduled?  TBD   3. What type of clearance is required (medical clearance vs. Pharmacy clearance to hold med vs. Both)?  medical  4. Are there any medications that need to be held prior to surgery and how long?    5. Practice name and name of physician performing surgery?  [facility]   6. What is your office phone number [phone]    7.   What is your office fax number [phone]  8.   Anesthesia type (None, local, MAC, general) ?  local   Frederik Schmidt 10/01/2018, 9:45 AM  _________________________________________________________________   (provider comments below)

## 2018-10-01 NOTE — Telephone Encounter (Signed)
   Primary Cardiologist: Lauree Chandler, MD  Chart reviewed as part of pre-operative protocol coverage. Given past medical history, time since last visit and phone call from today, based on ACC/AHA guidelines, BLAINE GUIFFRE would be at acceptable risk for the planned procedure without further cardiovascular testing.  Patient is on Plavix which will not need to be held prior to procedure according to patient. She will need SBE prophylaxis. We spoke about this on the phone and I have personally sent a prescription for Amoxicillin 2g PO x1 to be taken one hour prior to dental procedure. This was sent to her preferred pharmacy.   I will route this recommendation to the requesting party via Epic fax function and remove from pre-op pool.  Please call with questions.  Kathyrn Drown, NP 10/01/2018, 1:06 PM

## 2018-10-04 ENCOUNTER — Ambulatory Visit
Admission: RE | Admit: 2018-10-04 | Discharge: 2018-10-04 | Disposition: A | Discharge status: Home / Self Care | Payer: Medicaid Other | Source: Ambulatory Visit | Attending: Radiation Oncology | Admitting: Radiation Oncology

## 2018-10-04 DIAGNOSIS — Z510 Encounter for antineoplastic radiation therapy: Secondary | ICD-10-CM | POA: Diagnosis not present

## 2018-10-05 ENCOUNTER — Telehealth: Payer: Self-pay | Admitting: *Deleted

## 2018-10-05 ENCOUNTER — Ambulatory Visit
Admission: RE | Admit: 2018-10-05 | Discharge: 2018-10-05 | Disposition: A | Discharge status: Home / Self Care | Payer: Medicaid Other | Source: Ambulatory Visit | Attending: Radiation Oncology | Admitting: Radiation Oncology

## 2018-10-05 DIAGNOSIS — Z510 Encounter for antineoplastic radiation therapy: Secondary | ICD-10-CM | POA: Diagnosis not present

## 2018-10-05 NOTE — Telephone Encounter (Signed)
On 10-05-18 fax medical records to [facility]

## 2018-10-06 ENCOUNTER — Ambulatory Visit
Admission: RE | Admit: 2018-10-06 | Discharge: 2018-10-06 | Disposition: A | Discharge status: Home / Self Care | Payer: Medicaid Other | Source: Ambulatory Visit | Attending: Radiation Oncology | Admitting: Radiation Oncology

## 2018-10-06 DIAGNOSIS — Z170 Estrogen receptor positive status [ER+]: Principal | ICD-10-CM

## 2018-10-06 DIAGNOSIS — Z510 Encounter for antineoplastic radiation therapy: Secondary | ICD-10-CM | POA: Diagnosis not present

## 2018-10-06 DIAGNOSIS — C50411 Malignant neoplasm of upper-outer quadrant of right female breast: Secondary | ICD-10-CM

## 2018-10-06 MED ORDER — SILVER SULFADIAZINE 1 % EX CREA
TOPICAL_CREAM | Freq: Two times a day (BID) | CUTANEOUS | Status: DC
  Administered 2018-10-06: 15:00:00 via TOPICAL

## 2018-10-07 ENCOUNTER — Ambulatory Visit
Admission: RE | Admit: 2018-10-07 | Discharge: 2018-10-07 | Disposition: A | Discharge status: Home / Self Care | Payer: Medicaid Other | Source: Ambulatory Visit | Attending: Radiation Oncology | Admitting: Radiation Oncology

## 2018-10-07 DIAGNOSIS — Z510 Encounter for antineoplastic radiation therapy: Secondary | ICD-10-CM | POA: Diagnosis not present

## 2018-10-08 ENCOUNTER — Ambulatory Visit: Payer: Medicaid Other

## 2018-10-08 ENCOUNTER — Ambulatory Visit
Admission: RE | Admit: 2018-10-08 | Discharge: 2018-10-08 | Disposition: A | Discharge status: Home / Self Care | Payer: Medicaid Other | Source: Ambulatory Visit | Attending: Radiation Oncology | Admitting: Radiation Oncology

## 2018-10-08 ENCOUNTER — Ambulatory Visit: Payer: Medicaid Other | Admitting: Radiation Oncology

## 2018-10-08 DIAGNOSIS — Z510 Encounter for antineoplastic radiation therapy: Secondary | ICD-10-CM | POA: Diagnosis not present

## 2018-10-11 ENCOUNTER — Inpatient Hospital Stay: Payer: Medicaid Other | Attending: Oncology | Admitting: Oncology

## 2018-10-11 ENCOUNTER — Other Ambulatory Visit: Payer: Self-pay | Admitting: Internal Medicine

## 2018-10-11 ENCOUNTER — Telehealth: Payer: Self-pay | Admitting: Oncology

## 2018-10-11 ENCOUNTER — Ambulatory Visit
Admission: RE | Admit: 2018-10-11 | Discharge: 2018-10-11 | Disposition: A | Discharge status: Home / Self Care | Payer: Medicaid Other | Source: Ambulatory Visit | Attending: Radiation Oncology | Admitting: Radiation Oncology

## 2018-10-11 ENCOUNTER — Ambulatory Visit: Payer: Medicaid Other

## 2018-10-11 VITALS — BP 143/83 | HR 91 | Temp 97.5°F | Resp 18 | Ht 60.0 in | Wt 330.0 lb

## 2018-10-11 DIAGNOSIS — Z7982 Long term (current) use of aspirin: Secondary | ICD-10-CM | POA: Diagnosis not present

## 2018-10-11 DIAGNOSIS — E119 Type 2 diabetes mellitus without complications: Secondary | ICD-10-CM

## 2018-10-11 DIAGNOSIS — E785 Hyperlipidemia, unspecified: Secondary | ICD-10-CM | POA: Diagnosis not present

## 2018-10-11 DIAGNOSIS — Z170 Estrogen receptor positive status [ER+]: Secondary | ICD-10-CM

## 2018-10-11 DIAGNOSIS — I252 Old myocardial infarction: Secondary | ICD-10-CM | POA: Diagnosis not present

## 2018-10-11 DIAGNOSIS — I2510 Atherosclerotic heart disease of native coronary artery without angina pectoris: Secondary | ICD-10-CM

## 2018-10-11 DIAGNOSIS — I5032 Chronic diastolic (congestive) heart failure: Secondary | ICD-10-CM | POA: Diagnosis not present

## 2018-10-11 DIAGNOSIS — G4730 Sleep apnea, unspecified: Secondary | ICD-10-CM | POA: Diagnosis not present

## 2018-10-11 DIAGNOSIS — J441 Chronic obstructive pulmonary disease with (acute) exacerbation: Secondary | ICD-10-CM

## 2018-10-11 DIAGNOSIS — Z794 Long term (current) use of insulin: Secondary | ICD-10-CM | POA: Diagnosis not present

## 2018-10-11 DIAGNOSIS — Z79899 Other long term (current) drug therapy: Secondary | ICD-10-CM | POA: Diagnosis not present

## 2018-10-11 DIAGNOSIS — I110 Hypertensive heart disease with heart failure: Secondary | ICD-10-CM | POA: Diagnosis not present

## 2018-10-11 DIAGNOSIS — Z803 Family history of malignant neoplasm of breast: Secondary | ICD-10-CM | POA: Diagnosis not present

## 2018-10-11 DIAGNOSIS — F329 Major depressive disorder, single episode, unspecified: Secondary | ICD-10-CM | POA: Diagnosis not present

## 2018-10-11 DIAGNOSIS — C50411 Malignant neoplasm of upper-outer quadrant of right female breast: Secondary | ICD-10-CM

## 2018-10-11 DIAGNOSIS — Z86711 Personal history of pulmonary embolism: Secondary | ICD-10-CM | POA: Diagnosis not present

## 2018-10-11 DIAGNOSIS — E669 Obesity, unspecified: Secondary | ICD-10-CM

## 2018-10-11 DIAGNOSIS — Z87442 Personal history of urinary calculi: Secondary | ICD-10-CM

## 2018-10-11 DIAGNOSIS — Z87891 Personal history of nicotine dependence: Secondary | ICD-10-CM | POA: Diagnosis not present

## 2018-10-11 DIAGNOSIS — G2581 Restless legs syndrome: Secondary | ICD-10-CM | POA: Diagnosis not present

## 2018-10-11 DIAGNOSIS — Z510 Encounter for antineoplastic radiation therapy: Secondary | ICD-10-CM | POA: Diagnosis not present

## 2018-10-11 DIAGNOSIS — J449 Chronic obstructive pulmonary disease, unspecified: Secondary | ICD-10-CM

## 2018-10-11 MED ORDER — ANASTROZOLE 1 MG PO TABS: 1.0000 mg | ORAL_TABLET | Freq: Every day | ORAL | 12 refills | Status: DC

## 2018-10-11 NOTE — Progress Notes (Signed)
[name]  [phone] [phone]     ID: AYSEL GILCHREST DOB: 01/07/1961  MR#: [id number]  TGY#:[id number]  Patient Care Team: Lorella Nimrod, MD as PCP - General (Internal Medicine) Burnell Blanks, MD as PCP - Cardiology (Cardiology) Alphonsa Overall, MD as Consulting Physician (General Surgery) Magrinat, Virgie Dad, MD as Consulting Physician (Oncology) Kyung Rudd, MD as Consulting Physician (Radiation Oncology) Burnell Blanks, MD as Consulting Physician (Cardiology) OTHER MD:   CHIEF COMPLAINT: Estrogen receptor positive breast cancer  CURRENT TREATMENT: Receiving adjuvant radiation   HISTORY OF CURRENT ILLNESS: On the original intake note:  [name] had routine screening mammography on 05/27/2018 showing a possible abnormality in the right breast. She underwent unilateral right  diagnostic mammography with tomography and right breast ultrasonography at [facility] on 06/02/2018 showing: breast density category B. An area of distortion is identified in the upper outer quadrant of the right breast mammographically. Ultrasonography of this area found an irregular hypoechoic mass at the 11 o'clock upper outer quadrant measuring 1.8 x 1.8 x 1.4 cm and located 6 cm from the nipple. The right axilla is negative sonographically for abnormal lymph nodes.   Accordingly on 06/04/2018 she proceeded to biopsy of the right breast area in question. The pathology from this procedure showed ([id number]): Invasive ductal carcinoma grade 1 measuring 1.0 cm in greatest extent, with microcalcifications. Ductal carcinoma in situ high grade. Prognostic indicators significant for: estrogen receptor, 100% positive and progesterone receptor, 100% positive, both with strong staining intensity. Proliferation marker Ki67 at 8%. HER2  not amplified with ratios HER2/CEP17 signals 1.32 and average HER2 copies per cell 1.85  The patient's  subsequent history is as detailed below.  INTERVAL HISTORY: [name] presents to the office today for follow up of her estrogen receptor positive breast cancer.   Since her last visit to the office, she underwent a right lumpectomy ([id number]) on 07/19/2018 that showed: Breast, lumpectomy, Right w/seed with invasive ductal carcinoma with calcifications, 1.8 cm, grade 1. Ductal carcinoma in situ, intermediate nuclear grade. All resections margins are negative for in situ or invasive carcinoma. Negative for lymphovascular or perineural invasion. Biopsy site changes. Breast, excision, Right Medial Margin with benign breast tissue, negative for in situ or invasive carcinoma. Lymph node, sentinel, biopsy, Right Axillary with lymph node, negative for carcinoma (0/1). Lymph node, sentinel, biopsy with metastatic breast carcinoma to lymph node (1/1). Metastatic carcinoma is 0.1 cm in greatest dimension. Lymph node, sentinel, biopsy with metastatic breast carcinoma to lymph node (1/1). Metastatic carcinoma measures 0.4 cm in greatest dimension.   She also had a mammaprint completed on 07/19/2018 that showed: Average 10-year risk of recurrence untreated at 10%. It also noted predicted prognosis for MammaPrint being Low Risk, 97.8% of LOW RISK MammaPrint patients who were treated with hormone therapy alone (Tamoxifen/AI) are living without distant recurrence of breast at 5 years (Distant Metastasis Free Interval)   She is currently receiving radiation therapy with Radiation Oncologist, Dr. Lisbeth Renshaw. She notes that she is currently having radiation related skin changes to her breast. She has been using Tefla pads to aid with the desquamation. She also has fatigue that she treats with sleep.    REVIEW OF SYSTEMS: [name] reports that she is not currently exercising. She will be having dental procedure in the near future. She denies unusual headaches, visual changes, nausea, vomiting, or dizziness. There has been no  unusual cough, phlegm production, or pleurisy. This been no change in bowel or  bladder habits. She denies unexplained fatigue or unexplained weight loss, bleeding, rash, or fever. A detailed review of systems was otherwise stable.   PAST MEDICAL HISTORY: Past Medical History:  Diagnosis Date  . Anemia   . Aortic valve stenosis, severe   . 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 Asthma   . Blood transfusion    [facility]  . Breast cancer in female [facility])    Right  . CAD (coronary artery disease)    Cath 2010 with DES x 1 RCA-- PT'S CARDIOLOGIST IS DR. [place]ac  . Chronic diastolic congestive heart failure ([place])   . COPD (chronic obstructive pulmonary disease) ([place])   . Depression   . Diabetes mellitus DIAGNOSED IN2010   Controll s with diet  . Eczema    on back  . Hyperlipidemia   . Hypertension   . Kidney stones   . Morbid obesity with body mass index of 60.0-69.9 in adult [facility])   . Myocardial infarction ([place])    PT THINKS SHE WAS DX WITH MI AT THE TIME OF HEART STENTING  . Pulmonary embolism ([place]) 02/08/12   S/P RT TOTAL KNEE ON 02/03/12--ON 02/08/12--DEVELOPED ACUTE SOB AND CHEST PAIN--AND DIAGNOSED WITH  PULMONARY EMBOLUS AND PNEUMONIA  . Restless leg syndrome   . Sleep apnea    uses 3 liters O2 at night   . Uterine fibroid    NO PROBLEMS AT PRESENT FROM THE FIBROIDS-STATES SHE IS POST MENOPAUSAL-LAST MENSES 2010 EXCEPT FOR EPISODE THIS YR OF BLEEDING RELATED TO FIBROIDS.  Marland Kitchen Weakness    BOTH HANDS - S/P BILATERAL CARPAL TUNNEL RELEASE--BUT STILL HAS WEAKNESS--OFTEN DROPS THINGS  Migraines at younger age. MI in 2009  PAST SURGICAL HISTORY: Past Surgical History:  Procedure Laterality Date  . BREAST LUMPECTOMY WITH RADIOACTIVE SEED AND SENTINEL LYMPH NODE BIOPSY Right 07/19/2018   Procedure: BREAST LUMPECTOMY WITH RADIOACTIVE SEED AND SENTINEL LYMPH NODE BIOPSY;  Surgeon:  Alphonsa Overall, MD;  Location: [facility];  Service: General;  Laterality: Right;  . CARDIAC CATHETERIZATION    . CARPAL TUNNEL RELEASE     Bilateral  . CHOLECYSTECTOMY    . CORONARY ANGIOPLASTY     2010 has stent in place  . CYSTOSCOPY W/ RETROGRADES Right 09/21/2013   Procedure: CYSTOSCOPY WITH RIGHT RETROGRADE PYELOGRAM RIGHT DOUBLE J STENT ;  Surgeon: Fredricka Bonine, MD;  Location[facility];  Service: Urology;  Laterality: Right;  . CYSTOSCOPY WITH URETEROSCOPY AND STENT PLACEMENT Right 10/25/2013   Procedure: CYSTOSCOPY RIGHT URETEROSCOPY HOLMIUM LASER LITHO AND STENT PLACEMENT;  Surgeon: Fredricka Bonine, MD;  Location: [facility];  Service: Urology;  Laterality: Right;  . HERNIA REPAIR    . INTRAOPERATIVE TRANSESOPHAGEAL ECHOCARDIOGRAM N/A 12/12/2014   Procedure: INTRAOPERATIVE TRANSESOPHAGEAL ECHOCARDIOGRAM;  Surgeon: Burnell Blanks, MD;  Location: [facility] OR;  Service: Cardiovascular;  Laterality: N/A;  . JOINT REPLACEMENT     bil total knees  . KNEE ARTHROPLASTY  02/03/2012   Procedure: COMPUTER ASSISTED TOTAL KNEE ARTHROPLASTY;  Surgeon: Mcarthur Rossetti, MD;  Location: [place];  Service: Orthopedics;  Laterality: Right;  Right total knee arthroplasty  . LEFT AND RIGHT HEART CATHETERIZATION WITH CORONARY ANGIOGRAM N/A 03/17/2013   Procedure: LEFT AND RIGHT HEART CATHETERIZATION WITH CORONARY ANGIOGRAM;  Surgeon: Burnell Blanks, MD;  Location: [facility] CATH LAB;  Service: Cardiovascular;  Laterality: N/A;  . LEFT  AND RIGHT HEART CATHETERIZATION WITH CORONARY/GRAFT ANGIOGRAM N/A 09/14/2014   Procedure: LEFT AND RIGHT HEART CATHETERIZATION WITH Beatrix Fetters;  Surgeon: Burnell Blanks, MD;  Location: [facility] CATH LAB;  Service: Cardiovascular;  Laterality: N/A;  . TEE WITHOUT CARDIOVERSION N/A 03/14/2013   Procedure: TRANSESOPHAGEAL ECHOCARDIOGRAM (TEE);  Surgeon: Lelon Perla, MD;  Location: [facility] ENDOSCOPY;  Service: Cardiovascular;  Laterality: N/A;  . TEE  WITHOUT CARDIOVERSION N/A 11/14/2014   Procedure: TRANSESOPHAGEAL ECHOCARDIOGRAM (TEE);  Surgeon: Lelon Perla, MD;  Location: [facility] ENDOSCOPY;  Service: Cardiovascular;  Laterality: N/A;  . TOTAL KNEE ARTHROPLASTY  09/10/2012   Procedure: TOTAL KNEE ARTHROPLASTY;  Surgeon: Mcarthur Rossetti, MD;  [facility];  Service: Orthopedics;  Laterality: Left;  . TOTAL KNEE REVISION Right 07/15/2013   Procedure: REVISION ARTHROPLASTY RIGHT KNEE;  Surgeon: Mcarthur Rossetti, MD;  [facility];  Service: Orthopedics;  Laterality: Right;  . TRANSCATHETER AORTIC VALVE REPLACEMENT, TRANSFEMORAL N/A 12/12/2014   Procedure: TRANSCATHETER AORTIC VALVE REPLACEMENT, TRANSFEMORAL;  Surgeon: Burnell Blanks, MD;  Location: [place];  Service: Cardiovascular;  Laterality: N/A;  . TRIGGER FINGER RELEASE  09/10/2012   Procedure: RELEASE TRIGGER FINGER/A-1 PULLEY;  Surgeon: Mcarthur Rossetti, MD;  [facility];  Service: Orthopedics;  Laterality: Right;  Right Ring Finger  . TUBAL LIGATION    Bovine Mitral Aortic valve surgery 2015   FAMILY HISTORY Family History  Problem Relation Age of Onset  . Breast cancer Mother        stage IV at diagnosis  . Emphysema Mother        smoked  . Heart disease Mother   . COPD Father        smoked  . Asthma Father   . Heart disease Father   . Cancer Brother        Sinus  The patient's father died at age 62 due to emphysema and possibly cancer . The patient's mother died at age 35 due to breast cancer with stage IV disease at presentation. The patient has 2 full and 1 half brothers and 1 sister.  One of the patient's brothers had nasopharyngeal cancer stage IV diagnosed at age 83. There was also a paternal great grandfather with prostate cancer. The patient denies a family history of ovarian cancer.    GYNECOLOGIC HISTORY:  Patient's last menstrual period was 02/03/2012. Menarche: 57 years old Age at first live birth: 57 years old She is [place]  P2. Her LMP was around age 60. She used oral contraception in her 16'X with no complications.    SOCIAL HISTORY:  Falisha is disabled. Her husband, Arnette Norris is retired from working in Health and safety inspector from [employer], Estate agent, and smoke damage. The patient's son, Christia Reading 41 lives in [place] and works as a Biomedical scientist. The patient's son Legrand Como 30 lives in [place] and is a Environmental education officer of a [employer]. The patient has no grandchildren. She attends [employer].     ADVANCED DIRECTIVES: not in place   HEALTH MAINTENANCE: Social History   Tobacco Use  . Smoking status: Former Smoker    Packs/day: 1.50    Years: 30.00    Pack years: 45.00    Types: Cigarettes    Last attempt to quit: 12/29/2000    Years since quitting: 17.7  . Smokeless tobacco: Never Used  Substance Use Topics  . Alcohol use: Yes    Alcohol/week: 1.0 standard drinks    Types: 1 Cans of beer per week  Frequency: Never    Comment: rarely  . Drug use: No     Colonoscopy: about 8 year ago  PAP: 10/20/2016 normal   Bone density: no   No Known Allergies  Current Outpatient Medications  Medication Sig Dispense Refill  . albuterol (PROVENTIL HFA;VENTOLIN HFA) 108 (90 Base) MCG/ACT inhaler Inhale 2 puffs into the lungs every 4 (four) hours as needed for wheezing or shortness of breath. 2 Inhaler 3  . albuterol (PROVENTIL) (2.5 MG/3ML) 0.083% nebulizer solution INHALE 1 VIAL VIA NEBULIZER EVERY FOUR HOURS AS NEEDED FOR WHEEZING OR SHORTNESS OF BREATH 180 mL 2  . Alirocumab (PRALUENT) 150 MG/ML SOPN Inject 150 mg into the skin every 14 (fourteen) days. 2 pen 11  . aspirin EC 81 MG tablet Take 1 tablet (81 mg total) by mouth daily. 90 tablet 3  . atorvastatin (LIPITOR) 80 MG tablet Take 1 tablet (80 mg total) by mouth daily. 90 tablet 3  . Blood Glucose Monitoring Suppl (ACCU-CHEK AVIVA CONNECT) w/Device KIT 1 kit by Does not apply route 2 (two) times daily at 8 am and 10 pm. 1 kit 0  . clopidogrel  (PLAVIX) 75 MG tablet TAKE 1 Tablet BY MOUTH EVERY MORNING 90 tablet 1  . DULoxetine (CYMBALTA) 30 MG capsule Take 1 capsule (30 mg total) by mouth 2 (two) times daily. 60 capsule 6  . ezetimibe (ZETIA) 10 MG tablet Take 1 tablet (10 mg total) by mouth daily. 90 tablet 3  . Fluticasone-Umeclidin-Vilant (TRELEGY ELLIPTA) 100-62.5-25 MCG/INH AEPB Inhale 1 puff into the lungs daily. 1 each 11  . furosemide (LASIX) 40 MG tablet TAKE 1 Tablet  BY MOUTH TWICE DAILY AS NEEDED FOR SWELLING 60 tablet 3  . gabapentin (NEURONTIN) 300 MG capsule Take 3 capsules (900 mg total) by mouth at bedtime. 90 capsule 1  . glucose blood (ACCU-CHEK AVIVA PLUS) test strip Use to test blood sugar 2 times daily. diag code E11.9. Non insulin dependent, A1C greater than 9. 300 each 1  . hydrochlorothiazide (HYDRODIURIL) 25 MG tablet Take 1 tablet (25 mg total) by mouth daily. 90 tablet 0  . ibuprofen (ADVIL,MOTRIN) 200 MG tablet Take 400 mg by mouth every 6 (six) hours as needed for moderate pain.    . Insulin Pen Needle (PEN NEEDLES) 32G X 4 MM MISC 1 applicator by Does not apply route daily. 100 each 1  . Lancets (ACCU-CHEK SOFT TOUCH) lancets Use as instructed 100 each 12  . liraglutide (VICTOZA) 18 MG/3ML SOPN Inject 0.1 mLs (0.6 mg total) into the skin daily for 7 days, THEN 0.2 mLs (1.2 mg total) daily. 6.7 mL 5  . losartan (COZAAR) 100 MG tablet TAKE 1 Tablet BY MOUTH ONCE DAILY 90 tablet 1  . meloxicam (MOBIC) 15 MG tablet TAKE 1 TABLET BY MOUTH EVERY DAY FOR 7 DAYS - STOP CLOPIDOGREL FOR THOSE 7 DAYS - THEN TAKE 1 TABLET DAILY ONLY IF NEEDED AS DIRECTED (Patient taking differently: Take 15 mg by mouth daily as needed for pain) 30 tablet 0  . metFORMIN (GLUMETZA) 1000 MG (MOD) 24 hr tablet Take 1 tablet (1,000 mg total) by mouth 2 (two) times daily. 180 tablet 3  . metoprolol tartrate (LOPRESSOR) 25 MG tablet TAKE 1 Tablet  BY MOUTH TWICE DAILY 180 tablet 1  . NITROSTAT 0.4 MG SL tablet PLACE 1 TABLET UNDER TONGUE AS  NEEDED FOR CHEST PAIN EVERY 5 MINUTES X 3 MAX DOSES.  CALL 911 IF PAIN PERSISTS 100 tablet 0  . OXYGEN Inhale  3 L into the lungs daily.    . potassium chloride SA (K-DUR,KLOR-CON) 20 MEQ tablet TAKE 2 Tablets BY MOUTH EVERY NIGHT AT BEDTIME DO NOT TAKE IF YOU DO NOT TAKE FUROSEMIDE 60 tablet 0  . Tetrahydrozoline HCl (VISINE OP) Place 2 drops into both eyes daily as needed (for dry eyes).     No current facility-administered medications for this visit.     OBJECTIVE: Morbidly obese white woman carrying a portable oxygen tank connected by nasal cannula to patient  Vitals:   10/11/18 1117  BP: (!) 143/83  Pulse: 91  Resp: 18  Temp: (!) 97.5 F (36.4 C)  SpO2: 98%     Body mass index is 64.45 kg/m.   Wt Readings from Last 3 Encounters:  10/11/18 (!) 330 lb (149.7 kg)  09/27/18 (!) 333 lb 11.2 oz (151.4 kg)  08/09/18 (!) 338 lb 14.4 oz (153.7 kg)      ECOG FS:3 - Symptomatic, >50% confined to bed  Significant erythema and wet desquamation in the inframammary fold on the right and the right axilla area  LAB RESULTS:  CMP     Component Value Date/Time   NA 134 (L) 07/02/2018 1313   K 4.2 07/02/2018 1313   CL 99 07/02/2018 1313   CO2 25 07/02/2018 1313   GLUCOSE 346 (H) 07/02/2018 1313   BUN 8 07/02/2018 1313   CREATININE 0.82 07/02/2018 1313   CREATININE 0.87 06/16/2018 0818   CREATININE 0.87 07/09/2012 1603   CALCIUM 8.9 07/02/2018 1313   PROT 7.0 08/06/2018 1323   ALBUMIN 4.2 08/06/2018 1323   AST 24 08/06/2018 1323   AST 16 06/16/2018 0818   ALT 22 08/06/2018 1323   ALT 22 06/16/2018 0818   ALKPHOS 81 08/06/2018 1323   BILITOT 1.0 08/06/2018 1323   BILITOT 0.7 06/16/2018 0818   GFRNONAA >60 07/02/2018 1313   GFRNONAA >60 06/16/2018 0818   GFRAA >60 07/02/2018 1313   GFRAA >60 06/16/2018 0818    No results found for: TOTALPROTELP, ALBUMINELP, A1GS, A2GS, BETS, BETA2SER, GAMS, MSPIKE, SPEI  No results found for: KPAFRELGTCHN, LAMBDASER, KAPLAMBRATIO  Lab  Results  Component Value Date   WBC 7.0 07/02/2018   NEUTROABS 4.1 06/16/2018   HGB 12.7 07/02/2018   HCT 40.5 07/02/2018   MCV 83.7 07/02/2018   PLT 220 07/02/2018    _0 @  No results found for: LABCA2  No components found for: ASTMHD622  No results for input(s): INR in the last 168 hours.  No results found for: LABCA2  No results found for: WLN989  No results found for: QJJ941  No results found for: DEY814  No results found for: CA2729  No components found for: HGQUANT  No results found for: CEA1 / No results found for: CEA1   No results found for: AFPTUMOR  No results found for: CHROMOGRNA  No results found for: PSA1  No visits with results within 3 Day(s) from this visit.  Latest known visit with results is:  Appointment on 08/06/2018  Component Date Value Ref Range Status  . Total Protein 08/06/2018 7.0  6.0 - 8.5 g/dL Final  . Albumin 08/06/2018 4.2  3.5 - 5.5 g/dL Final  . Bilirubin Total 08/06/2018 1.0  0.0 - 1.2 mg/dL Final  . Bilirubin, Direct 08/06/2018 0.23  0.00 - 0.40 mg/dL Final  . Alkaline Phosphatase 08/06/2018 81  39 - 117 IU/L Final  . AST 08/06/2018 24  0 - 40 IU/L Final  . ALT 08/06/2018 22  0 - 32 IU/L Final  . Cholesterol, Total 08/06/2018 153  100 - 199 mg/dL Final  . Triglycerides 08/06/2018 99  0 - 149 mg/dL Final  . HDL 08/06/2018 49  >39 mg/dL Final  . VLDL Cholesterol Cal 08/06/2018 20  5 - 40 mg/dL Final  . LDL Calculated 08/06/2018 84  0 - 99 mg/dL Final  . Chol/HDL Ratio 08/06/2018 3.1  0.0 - 4.4 ratio Final   Comment:                                   T. Chol/HDL Ratio                                             Men  Women                               1/2 Avg.Risk  3.4    3.3                                   Avg.Risk  5.0    4.4                                2X Avg.Risk  9.6    7.1                                3X Avg.Risk 23.4   11.0     (this displays the last labs from the last 3 days)  No results  found for: TOTALPROTELP, ALBUMINELP, A1GS, A2GS, BETS, BETA2SER, GAMS, MSPIKE, SPEI (this displays SPEP labs)  No results found for: KPAFRELGTCHN, LAMBDASER, KAPLAMBRATIO (kappa/lambda light chains)  No results found for: HGBA, HGBA2QUANT, HGBFQUANT, HGBSQUAN (Hemoglobinopathy evaluation)   No results found for: LDH  Lab Results  Component Value Date   IRON 38 (L) 02/10/2012   TIBC 310 02/10/2012   IRONPCTSAT 12 (L) 02/10/2012   (Iron and TIBC)  Lab Results  Component Value Date   FERRITIN 27 02/09/2018    Urinalysis    Component Value Date/Time   COLORURINE YELLOW 01/12/2018 0840   APPEARANCEUR HAZY (A) 01/12/2018 0840   LABSPEC 1.028 01/12/2018 0840   PHURINE 5.0 01/12/2018 0840   GLUCOSEU NEGATIVE 01/12/2018 0840   HGBUR NEGATIVE 01/12/2018 0840   BILIRUBINUR NEGATIVE 01/12/2018 0840   KETONESUR NEGATIVE 01/12/2018 0840   PROTEINUR NEGATIVE 01/12/2018 0840   UROBILINOGEN 1.0 12/08/2014 1303   NITRITE NEGATIVE 01/12/2018 0840   LEUKOCYTESUR TRACE (A) 01/12/2018 0840     STUDIES: No results found.  ELIGIBLE FOR AVAILABLE RESEARCH PROTOCOL: no  ASSESSMENT: 57 y.o. [place], [place] woman status post right breast upper outer quadrant biopsy 06/04/2018 for a clinical T1c N0, stage IA invasive ductal carcinoma, grade 1, estrogen and progesterone receptor strongly positive, HER-2 not amplified, with an MIB-108%  (1) status post right lumpectomy and sentinel lymph node sampling 07/19/2018 for a pT1c pN1, stage IB invasive ductal carcinoma, grade 1, with negative margins  (2) Mammaprint obtained on the definitive surgical specimen showed "low risk", predicting a risk of  recurrence outside the breast within 5 years of 2 to 3% if the patient takes antiestrogens for 5 years.  No significant benefit from chemotherapy anticipated  (3) adjuvant radiation to be completed 10/18/2018  (4) to start anastrozole 11/28/2018  PLAN: I spent approximately 30 minutes face to face with  [name] with more than 50% of that time spent in counseling and coordination of care.  We specifically reviewed her pathology report.  She is worried that she did not have a complete axillary dissection.  We discussed the fact that radiation is taking care of the axilla and that the risk of lymphedema is less with radiation then with completion axillary dissection.  There is no survival difference.  We also reviewed her Mammaprint results which luckily was low risk.  She understands if she does take an anastrozole inhibitor for 5 years her risk of distant recurrence within the next 5 years is less than 3%.  Kaena is having a hard time with radiation.  Luckily she is just about through with those treatments and I reassured her that very soon her skin will be healing.  She is still going to have discomfort in that area and she is still going to be feeling tired.  For that reason I would not consider starting anastrozole until November 28, 2018.  Hopefully she will be more or less back to baseline at that point.  We discussed the side effects toxicities and complications of anastrozole.  We are not considering tamoxifen because of her risk of clots.  I am going to see her again in February and by that time hopefully she will be well-established with this drug and able to tolerate it well.  If so the plan is to continue it for a total of 5 years.  Knows to call for any other issues that may develop before the next visit.  Magrinat, Virgie Dad, MD  10/11/18 11:22 AM Medical Oncology and Hematology [facility] [address] [place], [place] [address] Tel. [phone]    Fax. [phone]  Alice Rieger, am acting as scribe for Chauncey Cruel MD.  I, Lurline Del MD, have reviewed the above documentation for accuracy and completeness, and I agree with the above.    Removed sensitive 07/03/2018

## 2018-10-11 NOTE — Telephone Encounter (Signed)
Gave avs and calendar ° °

## 2018-10-11 NOTE — Progress Notes (Deleted)
[place]  [phone] [phone]     ID: [name] DOB: Feb 18, 1961  MR#: [id number]  VOP#:[id number]  Patient Care Team: Lorella Nimrod, MD as PCP - General (Internal Medicine) Burnell Blanks, MD as PCP - Cardiology (Cardiology) Alphonsa Overall, MD as Consulting Physician (General Surgery) Aleeah Greeno, Virgie Dad, MD as Consulting Physician (Oncology) Kyung Rudd, MD as Consulting Physician (Radiation Oncology) Burnell Blanks, MD as Consulting Physician (Cardiology) OTHER MD:   CHIEF COMPLAINT: Estrogen receptor positive breast cancer  CURRENT TREATMENT: Awaiting definitive surgery   HISTORY OF CURRENT ILLNESS: [name] had routine screening mammography on 05/27/2018 showing a possible abnormality in the right breast. She underwent unilateral right  diagnostic mammography with tomography and right breast ultrasonography at [facility] on 06/02/2018 showing: breast density category B. An area of distortion is identified in the upper outer quadrant of the right breast mammographically. Ultrasonography of this area found an irregular hypoechoic mass at the 11 o'clock upper outer quadrant measuring 1.8 x 1.8 x 1.4 cm and located 6 cm from the nipple. The right axilla is negative sonographically for abnormal lymph nodes.   Accordingly on 06/04/2018 she proceeded to biopsy of the right breast area in question. The pathology from this procedure showed ([id number]): Invasive ductal carcinoma grade 1 measuring 1.0 cm in greatest extent, with microcalcifications. Ductal carcinoma in situ high grade. Prognostic indicators significant for: estrogen receptor, 100% positive and progesterone receptor, 100% positive, both with strong staining intensity. Proliferation marker Ki67 at 8%. HER2  not amplified with ratios HER2/CEP17 signals 1.32 and average HER2 copies per cell 1.85  The patientPratt subsequent history is as detailed  below.  INTERVAL HISTORY: [name] was evaluated in the multidisciplinary breast cancer clinic on 06/16/2018 accompanied by her husband, [name]. Her case was also presented at the multidisciplinary breast cancer conference on the same day. At that time a preliminary plan was proposed: Breast conserving surgery, adjuvant radiation, antiestrogens (no Oncotypedue to co morbidities).   REVIEW OF SYSTEMS: There were no specific symptoms leading to the original mammogram, which was routinely scheduled.  The patient has significant functional limitation secondary to morbid obesity, asthma, and heart disease.  She is pretty much homebound.  She is on oxygen 24/7.  The patient denies unusual headaches, visual changes, nausea, vomiting, stiff neck, dizziness, or gait imbalance. There has been no cough, phlegm production, or pleurisy, no chest pain or pressure, and no change in bowel or bladder habits. The patient denies fever, rash, bleeding, unexplained fatigue or unexplained weight loss. A detailed review of systems was otherwise entirely negative.  PAST MEDICAL HISTORY: Past Medical History:  Diagnosis Date  . Anemia   . Aortic valve stenosis, severe   . 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 Asthma   . Blood transfusion    [facility]  . Breast cancer in female [facility])    Right  . CAD (coronary artery disease)    Cath 2010 with DES x 1 RCA-- PTPratt CARDIOLOGIST IS DR. Elsah  . Chronic diastolic congestive heart failure ([place])   . COPD (chronic obstructive pulmonary disease) ([place])   . Depression   . Diabetes mellitus DIAGNOSED IN2010   Controll s with diet  . Eczema    on back  . Hyperlipidemia   . Hypertension   . Kidney stones   .  Morbid obesity with body mass index of 60.0-69.9 in adult [facility])   . Myocardial infarction ([place])    PT THINKS SHE WAS DX WITH MI AT THE TIME OF HEART  STENTING  . Pulmonary embolism ([place]) 02/08/12   S/P RT TOTAL KNEE ON 02/03/12--ON 02/08/12--DEVELOPED ACUTE SOB AND CHEST PAIN--AND DIAGNOSED WITH  PULMONARY EMBOLUS AND PNEUMONIA  . Restless leg syndrome   . Sleep apnea    uses 3 liters O2 at night   . Uterine fibroid    NO PROBLEMS AT PRESENT FROM THE FIBROIDS-STATES SHE IS POST MENOPAUSAL-LAST MENSES 2010 EXCEPT FOR EPISODE THIS YR OF BLEEDING RELATED TO FIBROIDS.  Marland Kitchen Weakness    BOTH HANDS - S/P BILATERAL CARPAL TUNNEL RELEASE--BUT STILL HAS WEAKNESS--OFTEN DROPS THINGS  Migraines at younger age. MI in 2009  PAST SURGICAL HISTORY: Past Surgical History:  Procedure Laterality Date  . BREAST LUMPECTOMY WITH RADIOACTIVE SEED AND SENTINEL LYMPH NODE BIOPSY Right 07/19/2018   Procedure: BREAST LUMPECTOMY WITH RADIOACTIVE SEED AND SENTINEL LYMPH NODE BIOPSY;  Surgeon: Alphonsa Overall, MD;  Location: [facility];  Service: General;  Laterality: Right;  . CARDIAC CATHETERIZATION    . CARPAL TUNNEL RELEASE     Bilateral  . CHOLECYSTECTOMY    . CORONARY ANGIOPLASTY     2010 has stent in place  . CYSTOSCOPY W/ RETROGRADES Right 09/21/2013   Procedure: CYSTOSCOPY WITH RIGHT RETROGRADE PYELOGRAM RIGHT DOUBLE J STENT ;  Surgeon: Fredricka Bonine, MD;  Location: [facility];  Service: Urology;  Laterality: Right;  . CYSTOSCOPY WITH URETEROSCOPY AND STENT PLACEMENT Right 10/25/2013   Procedure: CYSTOSCOPY RIGHT URETEROSCOPY HOLMIUM LASER LITHO AND STENT PLACEMENT;  Surgeon: Fredricka Bonine, MD;  Location: [facility];  Service: Urology;  Laterality: Right;  . HERNIA REPAIR    . INTRAOPERATIVE TRANSESOPHAGEAL ECHOCARDIOGRAM N/A 12/12/2014   Procedure: INTRAOPERATIVE TRANSESOPHAGEAL ECHOCARDIOGRAM;  Surgeon: Burnell Blanks, MD;  Location: [facility] OR;  Service: Cardiovascular;  Laterality: N/A;  . JOINT REPLACEMENT     bil total knees  . KNEE ARTHROPLASTY  02/03/2012   Procedure: COMPUTER ASSISTED TOTAL KNEE ARTHROPLASTY;  Surgeon: Mcarthur Rossetti,  MD;  Location: [place];  Service: Orthopedics;  Laterality: Right;  Right total knee arthroplasty  . LEFT AND RIGHT HEART CATHETERIZATION WITH CORONARY ANGIOGRAM N/A 03/17/2013   Procedure: LEFT AND RIGHT HEART CATHETERIZATION WITH CORONARY ANGIOGRAM;  Surgeon: Burnell Blanks, MD;  Location: [facility] CATH LAB;  Service: Cardiovascular;  Laterality: N/A;  . LEFT AND RIGHT HEART CATHETERIZATION WITH CORONARY/GRAFT ANGIOGRAM N/A 09/14/2014   Procedure: LEFT AND RIGHT HEART CATHETERIZATION WITH Beatrix Fetters;  Surgeon: Burnell Blanks, MD;  Location: [facility] CATH LAB;  Service: Cardiovascular;  Laterality: N/A;  . TEE WITHOUT CARDIOVERSION N/A 03/14/2013   Procedure: TRANSESOPHAGEAL ECHOCARDIOGRAM (TEE);  Surgeon: Lelon Perla, MD;  Location: [facility] ENDOSCOPY;  Service: Cardiovascular;  Laterality: N/A;  . TEE WITHOUT CARDIOVERSION N/A 11/14/2014   Procedure: TRANSESOPHAGEAL ECHOCARDIOGRAM (TEE);  Surgeon: Lelon Perla, MD;  Location: [facility] ENDOSCOPY;  Service: Cardiovascular;  Laterality: N/A;  . TOTAL KNEE ARTHROPLASTY  09/10/2012   Procedure: TOTAL KNEE ARTHROPLASTY;  Surgeon: Mcarthur Rossetti, MD;  [facility];  Service: Orthopedics;  Laterality: Left;  . TOTAL KNEE REVISION Right 07/15/2013   Procedure: REVISION ARTHROPLASTY RIGHT KNEE;  Surgeon: Mcarthur Rossetti, MD;  [facility];  Service: Orthopedics;  Laterality: Right;  . TRANSCATHETER AORTIC VALVE REPLACEMENT, TRANSFEMORAL N/A 12/12/2014   Procedure: TRANSCATHETER AORTIC VALVE REPLACEMENT, TRANSFEMORAL;  Surgeon: Burnell Blanks,  MD;  Location: [place];  Service: Cardiovascular;  Laterality: N/A;  . TRIGGER FINGER RELEASE  09/10/2012   Procedure: RELEASE TRIGGER FINGER/A-1 PULLEY;  Surgeon: Mcarthur Rossetti, MD;  [facility];  Service: Orthopedics;  Laterality: Right;  Right Ring Finger  . TUBAL LIGATION    Bovine Mitral Aortic valve surgery 2015   FAMILY HISTORY Family History  Problem  Relation Age of Onset  . Breast cancer Mother        stage IV at diagnosis  . Emphysema Mother        smoked  . Heart disease Mother   . COPD Father        smoked  . Asthma Father   . Heart disease Father   . Cancer Brother        Sinus  The patientPratt father died at age 7 due to emphysema and possibly cancer . The patientPratt mother died at age 48 due to breast cancer with stage IV disease at presentation. The patient has 2 full and 1 half brothers and 1 sister.  One of the patientPratt brothers had nasopharyngeal cancer stage IV diagnosed at age 70. There was also a paternal great grandfather with prostate cancer. The patient denies a family history of ovarian cancer.    GYNECOLOGIC HISTORY:  PatientPratt last menstrual period was 02/03/2012. Menarche: 57 years old Age at first live birth: 57 years old She is [name]. Her LMP was around age 68. She used oral contraception in her 16'X with no complications.    SOCIAL HISTORY:  Atiyah is disabled. Her husband, [name] is retired from working in Health and safety inspector from [employer], Estate agent, and smoke damage. The patientPratt son, [name] 17 lives in [place] and works as a Biomedical scientist. The patientPratt son [name] 30 lives in [place] and is a Environmental education officer of a [employer]. The patient has no grandchildren. She attends [employer].     ADVANCED DIRECTIVES: not in place   HEALTH MAINTENANCE: Social History   Tobacco Use  . Smoking status: Former Smoker    Packs/day: 1.50    Years: 30.00    Pack years: 45.00    Types: Cigarettes    Last attempt to quit: 12/29/2000    Years since quitting: 17.7  . Smokeless tobacco: Never Used  Substance Use Topics  . Alcohol use: Yes    Alcohol/week: 1.0 standard drinks    Types: 1 Cans of beer per week    Frequency: Never    Comment: rarely  . Drug use: No     Colonoscopy: about 8 year ago  PAP: 10/20/2016 normal   Bone density: no   No Known Allergies  Current Outpatient Medications   Medication Sig Dispense Refill  . albuterol (PROVENTIL HFA;VENTOLIN HFA) 108 (90 Base) MCG/ACT inhaler Inhale 2 puffs into the lungs every 4 (four) hours as needed for wheezing or shortness of breath. 2 Inhaler 3  . albuterol (PROVENTIL) (2.5 MG/3ML) 0.083% nebulizer solution INHALE 1 VIAL VIA NEBULIZER EVERY FOUR HOURS AS NEEDED FOR WHEEZING OR SHORTNESS OF BREATH 180 mL 2  . Alirocumab (PRALUENT) 150 MG/ML SOPN Inject 150 mg into the skin every 14 (fourteen) days. 2 pen 11  . aspirin EC 81 MG tablet Take 1 tablet (81 mg total) by mouth daily. 90 tablet 3  . atorvastatin (LIPITOR) 80 MG tablet Take 1 tablet (80 mg total) by mouth daily. 90 tablet 3  . Blood Glucose Monitoring Suppl (ACCU-CHEK AVIVA CONNECT) w/Device KIT 1 kit  by Does not apply route 2 (two) times daily at 8 am and 10 pm. 1 kit 0  . clopidogrel (PLAVIX) 75 MG tablet TAKE 1 Tablet BY MOUTH EVERY MORNING 90 tablet 1  . DULoxetine (CYMBALTA) 30 MG capsule Take 1 capsule (30 mg total) by mouth 2 (two) times daily. 60 capsule 6  . ezetimibe (ZETIA) 10 MG tablet Take 1 tablet (10 mg total) by mouth daily. 90 tablet 3  . Fluticasone-Umeclidin-Vilant (TRELEGY ELLIPTA) 100-62.5-25 MCG/INH AEPB Inhale 1 puff into the lungs daily. 1 each 11  . furosemide (LASIX) 40 MG tablet TAKE 1 Tablet  BY MOUTH TWICE DAILY AS NEEDED FOR SWELLING 60 tablet 3  . gabapentin (NEURONTIN) 300 MG capsule Take 3 capsules (900 mg total) by mouth at bedtime. 90 capsule 1  . glucose blood (ACCU-CHEK AVIVA PLUS) test strip Use to test blood sugar 2 times daily. diag code E11.9. Non insulin dependent, A1C greater than 9. 300 each 1  . hydrochlorothiazide (HYDRODIURIL) 25 MG tablet Take 1 tablet (25 mg total) by mouth daily. 90 tablet 0  . ibuprofen (ADVIL,MOTRIN) 200 MG tablet Take 400 mg by mouth every 6 (six) hours as needed for moderate pain.    . Insulin Pen Needle (PEN NEEDLES) 32G X 4 MM MISC 1 applicator by Does not apply route daily. 100 each 1  . Lancets  (ACCU-CHEK SOFT TOUCH) lancets Use as instructed 100 each 12  . liraglutide (VICTOZA) 18 MG/3ML SOPN Inject 0.1 mLs (0.6 mg total) into the skin daily for 7 days, THEN 0.2 mLs (1.2 mg total) daily. 6.7 mL 5  . losartan (COZAAR) 100 MG tablet TAKE 1 Tablet BY MOUTH ONCE DAILY 90 tablet 1  . meloxicam (MOBIC) 15 MG tablet TAKE 1 TABLET BY MOUTH EVERY DAY FOR 7 DAYS - STOP CLOPIDOGREL FOR THOSE 7 DAYS - THEN TAKE 1 TABLET DAILY ONLY IF NEEDED AS DIRECTED (Patient taking differently: Take 15 mg by mouth daily as needed for pain) 30 tablet 0  . metFORMIN (GLUMETZA) 1000 MG (MOD) 24 hr tablet Take 1 tablet (1,000 mg total) by mouth 2 (two) times daily. 180 tablet 3  . metoprolol tartrate (LOPRESSOR) 25 MG tablet TAKE 1 Tablet  BY MOUTH TWICE DAILY 180 tablet 1  . NITROSTAT 0.4 MG SL tablet PLACE 1 TABLET UNDER TONGUE AS NEEDED FOR CHEST PAIN EVERY 5 MINUTES X 3 MAX DOSES.  CALL 911 IF PAIN PERSISTS 100 tablet 0  . OXYGEN Inhale 3 L into the lungs daily.    . potassium chloride SA (K-DUR,KLOR-CON) 20 MEQ tablet TAKE 2 Tablets BY MOUTH EVERY NIGHT AT BEDTIME DO NOT TAKE IF YOU DO NOT TAKE FUROSEMIDE 60 tablet 0  . Tetrahydrozoline HCl (VISINE OP) Place 2 drops into both eyes daily as needed (for dry eyes).     No current facility-administered medications for this visit.     OBJECTIVE: Morbidly obese white woman receiving oxygen through nasal cannula  There were no vitals filed for this visit.   There is no height or weight on file to calculate BMI.   Wt Readings from Last 3 Encounters:  09/27/18 (!) 333 lb 11.2 oz (151.4 kg)  08/09/18 (!) 338 lb 14.4 oz (153.7 kg)  08/05/18 (!) 339 lb (153.8 kg)      ECOG FS:3 - Symptomatic, >50% confined to bed  Ocular: Sclerae unicteric, pupils round and equal Ear-nose-throat: Oropharynx clear and moist Lymphatic: No cervical or supraclavicular adenopathy Lungs no rales or rhonchi Heart regular rate  and rhythm Abd soft, obese, nontender, positive bowel  sounds MSK no focal spinal tenderness, no joint edema Neuro: non-focal, well-oriented, positive affect Breasts: The right breast is status post recent biopsy.  There is no palpable mass.  The left breast is benign.  Both axillae are benign.   LAB RESULTS:  CMP     Component Value Date/Time   NA 134 (L) 07/02/2018 1313   K 4.2 07/02/2018 1313   CL 99 07/02/2018 1313   CO2 25 07/02/2018 1313   GLUCOSE 346 (H) 07/02/2018 1313   BUN 8 07/02/2018 1313   CREATININE 0.82 07/02/2018 1313   CREATININE 0.87 06/16/2018 0818   CREATININE 0.87 07/09/2012 1603   CALCIUM 8.9 07/02/2018 1313   PROT 7.0 08/06/2018 1323   ALBUMIN 4.2 08/06/2018 1323   AST 24 08/06/2018 1323   AST 16 06/16/2018 0818   ALT 22 08/06/2018 1323   ALT 22 06/16/2018 0818   ALKPHOS 81 08/06/2018 1323   BILITOT 1.0 08/06/2018 1323   BILITOT 0.7 06/16/2018 0818   GFRNONAA >60 07/02/2018 1313   GFRNONAA >60 06/16/2018 0818   GFRAA >60 07/02/2018 1313   GFRAA >60 06/16/2018 0818    No results found for: TOTALPROTELP, ALBUMINELP, A1GS, A2GS, BETS, BETA2SER, GAMS, MSPIKE, SPEI  No results found for: KPAFRELGTCHN, LAMBDASER, KAPLAMBRATIO  Lab Results  Component Value Date   WBC 7.0 07/02/2018   NEUTROABS 4.1 06/16/2018   HGB 12.7 07/02/2018   HCT 40.5 07/02/2018   MCV 83.7 07/02/2018   PLT 220 07/02/2018    '@LASTCHEMISTRY' @  No results found for: LABCA2  No components found for: [id number]  No results for input(s): INR in the last 168 hours.  No results found for: LABCA2  No results found for: WUJ811  No results found for: BJY782  No results found for: NFA213  No results found for: CA2729  No components found for: HGQUANT  No results found for: CEA1 / No results found for: CEA1   No results found for: AFPTUMOR  No results found for: CHROMOGRNA  No results found for: PSA1  No visits with results within 3 Day(s) from this visit.  Latest known visit with results is:  Appointment on  08/06/2018  Component Date Value Ref Range Status  . Total Protein 08/06/2018 7.0  6.0 - 8.5 g/dL Final  . Albumin 08/06/2018 4.2  3.5 - 5.5 g/dL Final  . Bilirubin Total 08/06/2018 1.0  0.0 - 1.2 mg/dL Final  . Bilirubin, Direct 08/06/2018 0.23  0.00 - 0.40 mg/dL Final  . Alkaline Phosphatase 08/06/2018 81  39 - 117 IU/L Final  . AST 08/06/2018 24  0 - 40 IU/L Final  . ALT 08/06/2018 22  0 - 32 IU/L Final  . Cholesterol, Total 08/06/2018 153  100 - 199 mg/dL Final  . Triglycerides 08/06/2018 99  0 - 149 mg/dL Final  . HDL 08/06/2018 49  >39 mg/dL Final  . VLDL Cholesterol Cal 08/06/2018 20  5 - 40 mg/dL Final  . LDL Calculated 08/06/2018 84  0 - 99 mg/dL Final  . Chol/HDL Ratio 08/06/2018 3.1  0.0 - 4.4 ratio Final   Comment:                                   T. Chol/HDL Ratio  Men  Women                               1/2 Avg.Risk  3.4    3.3                                   Avg.Risk  5.0    4.4                                2X Avg.Risk  9.6    7.1                                3X Avg.Risk 23.4   11.0     (this displays the last labs from the last 3 days)  No results found for: TOTALPROTELP, ALBUMINELP, A1GS, A2GS, BETS, BETA2SER, GAMS, MSPIKE, SPEI (this displays SPEP labs)  No results found for: KPAFRELGTCHN, LAMBDASER, KAPLAMBRATIO (kappa/lambda light chains)  No results found for: HGBA, HGBA2QUANT, HGBFQUANT, HGBSQUAN (Hemoglobinopathy evaluation)   No results found for: LDH  Lab Results  Component Value Date   IRON 38 (L) 02/10/2012   TIBC 310 02/10/2012   IRONPCTSAT 12 (L) 02/10/2012   (Iron and TIBC)  Lab Results  Component Value Date   FERRITIN 27 02/09/2018    Urinalysis    Component Value Date/Time   COLORURINE YELLOW 01/12/2018 0840   APPEARANCEUR HAZY (A) 01/12/2018 0840   LABSPEC 1.028 01/12/2018 0840   PHURINE 5.0 01/12/2018 0840   GLUCOSEU NEGATIVE 01/12/2018 0840   HGBUR NEGATIVE 01/12/2018 0840    BILIRUBINUR NEGATIVE 01/12/2018 0840   KETONESUR NEGATIVE 01/12/2018 0840   PROTEINUR NEGATIVE 01/12/2018 0840   UROBILINOGEN 1.0 12/08/2014 1303   NITRITE NEGATIVE 01/12/2018 0840   LEUKOCYTESUR TRACE (A) 01/12/2018 0840     STUDIES: No results found.  ELIGIBLE FOR AVAILABLE RESEARCH PROTOCOL: no  ASSESSMENT: 57 y.o. [place], [place] woman status post right breast upper outer quadrant biopsy 06/04/2018 for a clinical T1c N0, stage IA invasive ductal carcinoma, grade 1, estrogen and progesterone receptor strongly positive, HER-2 not amplified, with an MIB-108%  (1) status post right lumpectomy and sentinel lymph node sampling 07/19/2018 for a pT1c pN1, stage IB invasive ductal carcinoma, grade 1, with negative margins  (2) Mammaprint obtained on the definitive surgical specimen showed "low risk", predicting a risk of recurrence outside the breast within 5 years of 2 to 3% if the patient takes antiestrogens for 5 years.  No significant benefit from chemotherapy anticipated  (3) adjuvant radiation to be completed 10/18/2018  (4) aromatase inhibitors to start at the completion of local treatment  PLAN: We spent the better part of todayPratt hour-long appointment discussing the biology of her diagnosis and the specifics of her situation.  We first reviewed the fact that cancer is not one disease but more than 100 different diseases and that it is important to keep them separate-- otherwise when friends and relatives discuss their own cancer experiences with [name] confusion can result. Similarly we explained that if breast cancer spreads to the bone or liver, the patient would not have bone cancer or liver cancer, but breast cancer in the bone and breast cancer in the liver: one cancer in three places-- not 3 different cancers which otherwise would  have to be treated in 3 different ways.  We discussed the difference between local and systemic therapy. In terms of loco-regional treatment,  lumpectomy plus radiation is equivalent to mastectomy as far as survival is concerned. For this reason, and because the cosmetic results are generally superior, we recommend breast conserving surgery.   We then discussed the rationale for systemic therapy. There is some risk that this cancer may have already spread to other parts of her body. Patients frequently ask at this point about bone scans, CAT scans and PET scans to find out if they have occult breast cancer somewhere else. The problem is that in early stage disease we are much more likely to find false positives then true cancers and this would expose the patient to unnecessary procedures as well as unnecessary radiation. Scans cannot answer the question the patient really would like to know, which is whether she has microscopic disease elsewhere in her body. For those reasons we do not recommend them.  Of course we would proceed to aggressive evaluation of any symptoms that might suggest metastatic disease, but that is not the case here.  Next we went over the options for systemic therapy which are anti-estrogens, anti-HER-2 immunotherapy, and chemotherapy. [name] does not meet criteria for anti-HER-2 immunotherapy. She is a good candidate for anti-estrogens.  The question of chemotherapy is more complicated. Chemotherapy is most effective in rapidly growing, aggressive tumors. It is much less effective in low-grade, slow growing cancers, like [name].  Given that concern, and her multiple comorbidities, which in my view are much more significant in terms of prognosis that her breast cancer, we are going to bypass Oncotype testing and avoid chemotherapy in this case.  The plan then is for surgery to be followed by radiation, then aromatase inhibitors.  [name] has a good understanding of the overall plan. She agrees with it. She knows the goal of treatment in her case is cure. She will call with any problems that may develop before  her next visit here.   [name], Virgie Dad, MD  10/11/18 9:16 AM Medical Oncology and Hematology [facility] [address] [employer], [place] [address] Tel. [phone]    Fax. [phone]  Alice Rieger, am acting as scribe for Chauncey Cruel MD.  I, Lurline Del MD, have reviewed the above documentation for accuracy and completeness, and I agree with the above.    Removed sensitive 07/03/2018

## 2018-10-12 ENCOUNTER — Ambulatory Visit: Payer: Medicaid Other

## 2018-10-12 ENCOUNTER — Ambulatory Visit
Admission: RE | Admit: 2018-10-12 | Discharge: 2018-10-12 | Disposition: A | Discharge status: Home / Self Care | Payer: Medicaid Other | Source: Ambulatory Visit | Attending: Radiation Oncology | Admitting: Radiation Oncology

## 2018-10-12 DIAGNOSIS — Z510 Encounter for antineoplastic radiation therapy: Secondary | ICD-10-CM | POA: Diagnosis not present

## 2018-10-13 ENCOUNTER — Ambulatory Visit: Payer: Medicaid Other

## 2018-10-13 ENCOUNTER — Ambulatory Visit
Admission: RE | Admit: 2018-10-13 | Discharge: 2018-10-13 | Disposition: A | Discharge status: Home / Self Care | Payer: Medicaid Other | Source: Ambulatory Visit | Attending: Radiation Oncology | Admitting: Radiation Oncology

## 2018-10-13 DIAGNOSIS — Z510 Encounter for antineoplastic radiation therapy: Secondary | ICD-10-CM | POA: Diagnosis not present

## 2018-10-14 ENCOUNTER — Ambulatory Visit: Payer: Medicaid Other

## 2018-10-14 NOTE — Addendum Note (Signed)
Addended by: Kendria Halberg E on: 10/14/2018 09:38 AM   Modules accepted: Orders

## 2018-10-14 NOTE — Telephone Encounter (Signed)
Spoke with pt - she still has some Praluent 75mg  doses left before starting on the 150mg  dose. Scheduled f/u lab work in February once pt has completed a few higher 150mg  injections to assess efficacy.

## 2018-10-15 ENCOUNTER — Ambulatory Visit: Payer: Medicaid Other

## 2018-10-15 ENCOUNTER — Ambulatory Visit
Admission: RE | Admit: 2018-10-15 | Discharge: 2018-10-15 | Disposition: A | Discharge status: Home / Self Care | Payer: Medicaid Other | Source: Ambulatory Visit | Attending: Radiation Oncology | Admitting: Radiation Oncology

## 2018-10-15 DIAGNOSIS — Z510 Encounter for antineoplastic radiation therapy: Secondary | ICD-10-CM | POA: Diagnosis not present

## 2018-10-18 ENCOUNTER — Ambulatory Visit
Admission: RE | Admit: 2018-10-18 | Discharge: 2018-10-18 | Disposition: A | Discharge status: Home / Self Care | Payer: Medicaid Other | Source: Ambulatory Visit | Attending: Radiation Oncology | Admitting: Radiation Oncology

## 2018-10-18 ENCOUNTER — Ambulatory Visit: Payer: Medicaid Other

## 2018-10-18 ENCOUNTER — Telehealth: Payer: Self-pay | Admitting: Oncology

## 2018-10-18 DIAGNOSIS — Z510 Encounter for antineoplastic radiation therapy: Secondary | ICD-10-CM | POA: Diagnosis not present

## 2018-10-18 NOTE — Telephone Encounter (Signed)
LVM for pt regarding upcoming appts per 10/21 sch message

## 2018-10-19 ENCOUNTER — Ambulatory Visit
Admission: RE | Admit: 2018-10-19 | Discharge: 2018-10-19 | Disposition: A | Discharge status: Home / Self Care | Payer: Medicaid Other | Source: Ambulatory Visit | Attending: Radiation Oncology | Admitting: Radiation Oncology

## 2018-10-19 ENCOUNTER — Encounter: Payer: Self-pay | Admitting: Radiation Oncology

## 2018-10-19 DIAGNOSIS — Z510 Encounter for antineoplastic radiation therapy: Secondary | ICD-10-CM | POA: Diagnosis not present

## 2018-10-28 ENCOUNTER — Telehealth: Payer: Self-pay | Admitting: Internal Medicine

## 2018-11-01 ENCOUNTER — Telehealth: Payer: Self-pay | Admitting: Internal Medicine

## 2018-11-01 NOTE — Telephone Encounter (Signed)
Refill Request-Pt states she get this medication from the [facility] [phone]   Fluticasone-Umeclidin-Vilant (TRELEGY ELLIPTA) 100-62.5-25 MCG/INH AEPB

## 2018-11-02 LAB — HM DIABETES EYE EXAM

## 2018-11-03 ENCOUNTER — Telehealth: Payer: Self-pay

## 2018-11-03 NOTE — Telephone Encounter (Signed)
Called [facility] confirmed refills, last rec'd 10/17, should not be out, the pharmacy ask that she call them, attempted to call, lm for rtc

## 2018-11-03 NOTE — Telephone Encounter (Signed)
metFORMIN (GLUMETZA) 1000 MG (MOD) 24 hr tablet, Refill request @  [facility] [place], [place] [phone] (Phone) [phone] (Fax)

## 2018-11-04 ENCOUNTER — Telehealth: Payer: Self-pay | Admitting: Pharmacist

## 2018-11-04 DIAGNOSIS — J441 Chronic obstructive pulmonary disease with (acute) exacerbation: Secondary | ICD-10-CM

## 2018-11-04 MED ORDER — FLUTICASONE-UMECLIDIN-VILANT 100-62.5-25 MCG/INH IN AEPB: 1.0000 | INHALATION_SPRAY | Freq: Every day | RESPIRATORY_TRACT | 11 refills | Status: DC

## 2018-11-04 NOTE — Telephone Encounter (Signed)
Faxed prescription in today  Thank you!

## 2018-11-08 NOTE — Telephone Encounter (Signed)
Thank you :)

## 2018-11-11 NOTE — Telephone Encounter (Signed)
Trelegy refill

## 2018-11-16 NOTE — Progress Notes (Signed)
  Radiation Oncology         [phone]) [phone] ________________________________  Name: [name] MRN: [id number]  Date: 10/19/2018  DOB: 1961/12/25  End of Treatment Note  Diagnosis:   57 y.o. 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 This was delivered using a 3-D conformal technique. The patient then received a boost to the seroma. This delivered an additional 10 Gy in 5 fractions using 15x photons with a Complex Isodose technique. The total dose was 60.4 Gy.  Narrative: The patient tolerated radiation treatment relatively well.   The patient had some expected skin irritation as she progressed during treatment. Moist desquamation was present at the end of treatment, most prominent in the inframammary region. She was advised to continue application of Silvadene to the areas of moist desquamation.  Plan: The patient has completed radiation treatment. The patient will return to radiation oncology clinic for routine followup in one month. I advised the patient to call or return sooner if they have any questions or concerns related to their recovery or treatment. ________________________________  Jodelle Gross, MD, PhD  This document serves as a record of services personally performed by Kyung Rudd, MD. It was created on his behalf by Rae Lips, a trained medical scribe. The creation of this record is based on the scribe's personal observations and the provider's statements to them. This document has been checked and approved by the attending provider.

## 2018-11-24 ENCOUNTER — Telehealth: Payer: Self-pay

## 2018-11-24 NOTE — Telephone Encounter (Signed)
Spoke with patient reminding of SCP visit with NP on 11/29/18 at 2 pm.  Patient said she will come to appt.

## 2018-11-29 ENCOUNTER — Encounter: Payer: Self-pay | Admitting: Adult Health

## 2018-11-29 ENCOUNTER — Inpatient Hospital Stay: Payer: Medicaid Other | Attending: Oncology | Admitting: Adult Health

## 2018-11-29 VITALS — BP 164/72 | HR 92 | Temp 98.9°F | Resp 17 | Ht 60.0 in | Wt 321.3 lb

## 2018-11-29 DIAGNOSIS — I5032 Chronic diastolic (congestive) heart failure: Secondary | ICD-10-CM

## 2018-11-29 DIAGNOSIS — Z87891 Personal history of nicotine dependence: Secondary | ICD-10-CM

## 2018-11-29 DIAGNOSIS — Z170 Estrogen receptor positive status [ER+]: Secondary | ICD-10-CM | POA: Insufficient documentation

## 2018-11-29 DIAGNOSIS — Z86711 Personal history of pulmonary embolism: Secondary | ICD-10-CM

## 2018-11-29 DIAGNOSIS — R232 Flushing: Secondary | ICD-10-CM | POA: Diagnosis not present

## 2018-11-29 DIAGNOSIS — Z808 Family history of malignant neoplasm of other organs or systems: Secondary | ICD-10-CM | POA: Insufficient documentation

## 2018-11-29 DIAGNOSIS — Z6841 Body Mass Index (BMI) 40.0 and over, adult: Secondary | ICD-10-CM

## 2018-11-29 DIAGNOSIS — Z79811 Long term (current) use of aromatase inhibitors: Secondary | ICD-10-CM | POA: Diagnosis not present

## 2018-11-29 DIAGNOSIS — G2581 Restless legs syndrome: Secondary | ICD-10-CM

## 2018-11-29 DIAGNOSIS — Z79899 Other long term (current) drug therapy: Secondary | ICD-10-CM | POA: Insufficient documentation

## 2018-11-29 DIAGNOSIS — E119 Type 2 diabetes mellitus without complications: Secondary | ICD-10-CM | POA: Diagnosis not present

## 2018-11-29 DIAGNOSIS — E785 Hyperlipidemia, unspecified: Secondary | ICD-10-CM | POA: Diagnosis not present

## 2018-11-29 DIAGNOSIS — R531 Weakness: Secondary | ICD-10-CM

## 2018-11-29 DIAGNOSIS — I110 Hypertensive heart disease with heart failure: Secondary | ICD-10-CM

## 2018-11-29 DIAGNOSIS — F329 Major depressive disorder, single episode, unspecified: Secondary | ICD-10-CM | POA: Diagnosis not present

## 2018-11-29 DIAGNOSIS — Z803 Family history of malignant neoplasm of breast: Secondary | ICD-10-CM | POA: Insufficient documentation

## 2018-11-29 DIAGNOSIS — C50411 Malignant neoplasm of upper-outer quadrant of right female breast: Secondary | ICD-10-CM

## 2018-11-29 DIAGNOSIS — Z923 Personal history of irradiation: Secondary | ICD-10-CM | POA: Insufficient documentation

## 2018-11-29 DIAGNOSIS — R197 Diarrhea, unspecified: Secondary | ICD-10-CM | POA: Insufficient documentation

## 2018-11-29 DIAGNOSIS — R112 Nausea with vomiting, unspecified: Secondary | ICD-10-CM | POA: Diagnosis not present

## 2018-11-29 DIAGNOSIS — N644 Mastodynia: Secondary | ICD-10-CM | POA: Diagnosis not present

## 2018-11-29 DIAGNOSIS — G4730 Sleep apnea, unspecified: Secondary | ICD-10-CM | POA: Insufficient documentation

## 2018-11-29 DIAGNOSIS — R5383 Other fatigue: Secondary | ICD-10-CM | POA: Insufficient documentation

## 2018-11-29 DIAGNOSIS — I252 Old myocardial infarction: Secondary | ICD-10-CM | POA: Insufficient documentation

## 2018-11-29 DIAGNOSIS — R200 Anesthesia of skin: Secondary | ICD-10-CM | POA: Diagnosis not present

## 2018-11-29 DIAGNOSIS — J449 Chronic obstructive pulmonary disease, unspecified: Secondary | ICD-10-CM

## 2018-11-29 DIAGNOSIS — E2839 Other primary ovarian failure: Secondary | ICD-10-CM

## 2018-11-29 DIAGNOSIS — Z87442 Personal history of urinary calculi: Secondary | ICD-10-CM | POA: Insufficient documentation

## 2018-11-29 DIAGNOSIS — I2510 Atherosclerotic heart disease of native coronary artery without angina pectoris: Secondary | ICD-10-CM

## 2018-11-29 NOTE — Progress Notes (Signed)
CLINIC:  Survivorship   REASON FOR VISIT:  Routine follow-up post-treatment for a recent history of breast cancer.  BRIEF ONCOLOGIC HISTORY:    Malignant neoplasm of upper-outer quadrant of right breast in female, estrogen receptor positive ([place])   06/04/2018 Initial Biopsy     status post right breast upper outer quadrant biopsy 06/04/2018 for a clinical T1c N0, stage IA invasive ductal carcinoma, grade 1, estrogen and progesterone receptor strongly positive, HER-2 not amplified, with an MIB-8%    06/10/2018 Initial Diagnosis    Malignant neoplasm of upper-outer quadrant of right breast in female, estrogen receptor positive ([place])    07/19/2018 Surgery    status post right lumpectomy and sentinel lymph node sampling 07/19/2018 for a pT1c pN1, stage IB invasive ductal carcinoma, grade 1, with negative margins.  3 lymph nodes biopsied, 1 with macrometastases, 1 with micrometastases.  Mammaprint obtained on the definitive surgical specimen showed "low risk", predicting a risk of recurrence outside the breast within 5 years of 2 to 3% if the patient takes antiestrogens for 5 years.  No significant benefit from chemotherapy anticipated       09/01/2018 - 10/19/2018 Radiation Therapy    The patient initially received a dose of 50.4 Gy in 28 fractions to the right breast using whole-breast tangent fields. This was delivered using a 3-D conformal technique. The patient then received a boost to the seroma. This delivered an additional 10 Gy in 5 fractions using 15x photons with a Complex Isodose technique. The total dose was 60.4 Gy.    11/2018 -  Anti-estrogen oral therapy    Anastrozole      INTERVAL HISTORY:  [name] presents to the [facility] today for our initial meeting to review her survivorship care plan detailing her treatment course for breast cancer, as well as monitoring long-term side effects of that treatment, education regarding health maintenance, screening, and  overall wellness and health promotion.     Overall, [name] reports feeling quite well today.  She started Anastrozole today.  She has noted some hot flashes today.  She has some pain in her right breast and underneath her right breast.  She says it feels like a hot poker.  This pain is intermittent. This is in the area where she had her surgery.  She also has some right underarm numbness.  She notes some numbness that radiates down into her right hand and fingertips.   She remains fatigued.    [name] has hip issues, but she is following with her PCP about this.  Right now, CT imaging is held up by authorization issues.      REVIEW OF SYSTEMS:  Review of Systems  Constitutional: Positive for fatigue. Negative for appetite change, chills, fever and unexpected weight change.  HENT:   Negative for hearing loss, lump/mass, mouth sores and trouble swallowing.   Eyes: Negative for eye problems and icterus.  Respiratory: Negative for chest tightness, cough and shortness of breath.   Cardiovascular: Negative for chest pain, leg swelling and palpitations.  Gastrointestinal: Negative for abdominal distention, abdominal pain, constipation, diarrhea, nausea and vomiting.  Endocrine: Positive for hot flashes.  Musculoskeletal: Negative for arthralgias.  Skin: Negative for itching and rash.  Neurological: Negative for dizziness, extremity weakness, headaches and numbness.  Hematological: Negative for adenopathy. Does not bruise/bleed easily.  Psychiatric/Behavioral: Negative for depression. The patient is not nervous/anxious.    Breast: Denies any new nodularity, masses, tenderness, nipple changes, or nipple discharge.  ONCOLOGY TREATMENT TEAM:  1. Surgeon:  Dr. Lucia Gaskins at [facility] 2. Medical Oncologist: Dr. Jana Hakim  3. Radiation Oncologist: Dr. Lisbeth Renshaw    PAST MEDICAL/SURGICAL HISTORY:  Past Medical History:  Diagnosis Date  . Anemia   . Aortic valve stenosis, severe    . 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 Asthma   . Blood transfusion    [facility]  . Breast cancer in female [facility])    Right  . CAD (coronary artery disease)    Cath 2010 with DES x 1 RCA-- PT'S CARDIOLOGIST IS DR. [place]  . Chronic diastolic congestive heart failure ([place])   . COPD (chronic obstructive pulmonary disease) ([place])   . Depression   . Diabetes mellitus DIAGNOSED IN2010   Controll s with diet  . Eczema    on back  . Hyperlipidemia   . Hypertension   . Kidney stones   . Morbid obesity with body mass index of 60.0-69.9 in adult [facility])   . Myocardial infarction ([place])    PT THINKS SHE WAS DX WITH MI AT THE TIME OF HEART STENTING  . Pulmonary embolism ([place]) 02/08/12   S/P RT TOTAL KNEE ON 02/03/12--ON 02/08/12--DEVELOPED ACUTE SOB AND CHEST PAIN--AND DIAGNOSED WITH  PULMONARY EMBOLUS AND PNEUMONIA  . Restless leg syndrome   . Sleep apnea    uses 3 liters O2 at night   . Uterine fibroid    NO PROBLEMS AT PRESENT FROM THE FIBROIDS-STATES SHE IS POST MENOPAUSAL-LAST MENSES 2010 EXCEPT FOR EPISODE THIS YR OF BLEEDING RELATED TO FIBROIDS.  Marland Kitchen Weakness    BOTH HANDS - S/P BILATERAL CARPAL TUNNEL RELEASE--BUT STILL HAS WEAKNESS--OFTEN DROPS THINGS   Past Surgical History:  Procedure Laterality Date  . BREAST LUMPECTOMY WITH RADIOACTIVE SEED AND SENTINEL LYMPH NODE BIOPSY Right 07/19/2018   Procedure: BREAST LUMPECTOMY WITH RADIOACTIVE SEED AND SENTINEL LYMPH NODE BIOPSY;  Surgeon: Alphonsa Overall, MD;  Location: [facility];  Service: General;  Laterality: Right;  . CARDIAC CATHETERIZATION    . CARPAL TUNNEL RELEASE     Bilateral  . CHOLECYSTECTOMY    . CORONARY ANGIOPLASTY     2010 has stent in place  . CYSTOSCOPY W/ RETROGRADES Right 09/21/2013   Procedure: CYSTOSCOPY WITH RIGHT RETROGRADE PYELOGRAM RIGHT DOUBLE J STENT ;  Surgeon: Fredricka Bonine, MD;  Location[facility];   Service: Urology;  Laterality: Right;  . CYSTOSCOPY WITH URETEROSCOPY AND STENT PLACEMENT Right 10/25/2013   Procedure: CYSTOSCOPY RIGHT URETEROSCOPY HOLMIUM LASER LITHO AND STENT PLACEMENT;  Surgeon: Fredricka Bonine, MD;  Location: [facility];  Service: Urology;  Laterality: Right;  . HERNIA REPAIR    . INTRAOPERATIVE TRANSESOPHAGEAL ECHOCARDIOGRAM N/A 12/12/2014   Procedure: INTRAOPERATIVE TRANSESOPHAGEAL ECHOCARDIOGRAM;  Surgeon: Burnell Blanks, MD;  Location: [facility] OR;  Service: Cardiovascular;  Laterality: N/A;  . JOINT REPLACEMENT     bil total knees  . KNEE ARTHROPLASTY  02/03/2012   Procedure: COMPUTER ASSISTED TOTAL KNEE ARTHROPLASTY;  Surgeon: Mcarthur Rossetti, MD;  Location: [facility];  Service: Orthopedics;  Laterality: Right;  Right total knee arthroplasty  . LEFT AND RIGHT HEART CATHETERIZATION WITH CORONARY ANGIOGRAM N/A 03/17/2013   Procedure: LEFT AND RIGHT HEART CATHETERIZATION WITH CORONARY ANGIOGRAM;  Surgeon: Burnell Blanks, MD;  Location: [facility] CATH LAB;  Service: Cardiovascular;  Laterality: N/A;  . LEFT AND RIGHT HEART CATHETERIZATION WITH  CORONARY/GRAFT ANGIOGRAM N/A 09/14/2014   Procedure: LEFT AND RIGHT HEART CATHETERIZATION WITH Beatrix Fetters;  Surgeon: Burnell Blanks, MD;  Location: [facility] CATH LAB;  Service: Cardiovascular;  Laterality: N/A;  . TEE WITHOUT CARDIOVERSION N/A 03/14/2013   Procedure: TRANSESOPHAGEAL ECHOCARDIOGRAM (TEE);  Surgeon: Lelon Perla, MD;  Location: [facility] ENDOSCOPY;  Service: Cardiovascular;  Laterality: N/A;  . TEE WITHOUT CARDIOVERSION N/A 11/14/2014   Procedure: TRANSESOPHAGEAL ECHOCARDIOGRAM (TEE);  Surgeon: Lelon Perla, MD;  Location: [facility] ENDOSCOPY;  Service: Cardiovascular;  Laterality: N/A;  . TOTAL KNEE ARTHROPLASTY  09/10/2012   Procedure: TOTAL KNEE ARTHROPLASTY;  Surgeon: Mcarthur Rossetti, MD;  [facility];  Service: Orthopedics;  Laterality: Left;  . TOTAL KNEE REVISION Right 07/15/2013    Procedure: REVISION ARTHROPLASTY RIGHT KNEE;  Surgeon: Mcarthur Rossetti, MD;  [facility];  Service: Orthopedics;  Laterality: Right;  . TRANSCATHETER AORTIC VALVE REPLACEMENT, TRANSFEMORAL N/A 12/12/2014   Procedure: TRANSCATHETER AORTIC VALVE REPLACEMENT, TRANSFEMORAL;  Surgeon: Burnell Blanks, MD;  Location: [facility];  Service: Cardiovascular;  Laterality: N/A;  . TRIGGER FINGER RELEASE  09/10/2012   Procedure: RELEASE TRIGGER FINGER/A-1 PULLEY;  Surgeon: Mcarthur Rossetti, MD;  [facility];  Service: Orthopedics;  Laterality: Right;  Right Ring Finger  . TUBAL LIGATION       ALLERGIES:  No Known Allergies   CURRENT MEDICATIONS:  Outpatient Encounter Medications as of 11/29/2018  Medication Sig  . albuterol (PROVENTIL) (2.5 MG/3ML) 0.083% nebulizer solution INHALE 1 VIAL VIA NEBULIZER EVERY FOUR HOURS AS NEEDED FOR WHEEZING OR SHORTNESS OF BREATH  . Alirocumab (PRALUENT) 150 MG/ML SOPN Inject 150 mg into the skin every 14 (fourteen) days.  Marland Kitchen anastrozole (ARIMIDEX) 1 MG tablet Take 1 tablet (1 mg total) by mouth daily.  . ASPIRIN ADULT LOW STRENGTH 81 MG EC tablet TAKE 1 Tablet BY MOUTH ONCE DAILY  . atorvastatin (LIPITOR) 80 MG tablet TAKE 1 Tablet BY MOUTH ONCE DAILY  . Blood Glucose Monitoring Suppl (ACCU-CHEK AVIVA CONNECT) w/Device KIT 1 kit by Does not apply route 2 (two) times daily at 8 am and 10 pm.  . clopidogrel (PLAVIX) 75 MG tablet TAKE 1 Tablet BY MOUTH EVERY MORNING  . DULoxetine (CYMBALTA) 30 MG capsule Take 1 capsule (30 mg total) by mouth 2 (two) times daily.  . Fluticasone-Umeclidin-Vilant (TRELEGY ELLIPTA) 100-62.5-25 MCG/INH AEPB Inhale 1 puff into the lungs daily.  . furosemide (LASIX) 40 MG tablet TAKE 1 Tablet  BY MOUTH TWICE DAILY AS NEEDED FOR SWELLING  . gabapentin (NEURONTIN) 300 MG capsule Take 3 capsules (900 mg total) by mouth at bedtime.  Marland Kitchen glucose blood (ACCU-CHEK AVIVA PLUS) test strip Use to test blood sugar 2 times daily. diag  code E11.9. Non insulin dependent, A1C greater than 9.  . ibuprofen (ADVIL,MOTRIN) 200 MG tablet Take 400 mg by mouth every 6 (six) hours as needed for moderate pain.  . Lancets (ACCU-CHEK SOFT TOUCH) lancets Use as instructed  . losartan (COZAAR) 100 MG tablet TAKE 1 Tablet BY MOUTH ONCE DAILY  . meloxicam (MOBIC) 15 MG tablet TAKE 1 TABLET BY MOUTH EVERY DAY FOR 7 DAYS - STOP CLOPIDOGREL FOR THOSE 7 DAYS - THEN TAKE 1 TABLET DAILY ONLY IF NEEDED AS DIRECTED (Patient taking differently: Take 15 mg by mouth daily as needed for pain)  . metFORMIN (GLUMETZA) 1000 MG (MOD) 24 hr tablet Take 1 tablet (1,000 mg total) by mouth 2 (two) times daily.  . metoprolol tartrate (LOPRESSOR) 25 MG tablet TAKE 1  Tablet  BY MOUTH TWICE DAILY  . NITROSTAT 0.4 MG SL tablet PLACE 1 TABLET UNDER TONGUE AS NEEDED FOR CHEST PAIN EVERY 5 MINUTES X 3 MAX DOSES.  CALL 911 IF PAIN PERSISTS  . OXYGEN Inhale 3 L into the lungs daily.  . potassium chloride SA (K-DUR,KLOR-CON) 20 MEQ tablet TAKE 2 Tablets BY MOUTH EVERY NIGHT AT BEDTIME DO NOT TAKE IF YOU DO NOT TAKE FUROSEMIDE  . PROVENTIL HFA 108 (90 Base) MCG/ACT inhaler INHALE 2 PUFFS BY MOUTH EVERY 4 HOURS AS NEEDED FOR COUGHING, WHEEZING, OR SHORTNESS OF BREATH  . ZETIA 10 MG tablet TAKE 1 Tablet BY MOUTH ONCE DAILY  . hydrochlorothiazide (HYDRODIURIL) 25 MG tablet Take 1 tablet (25 mg total) by mouth daily.  Marland Kitchen liraglutide (VICTOZA) 18 MG/3ML SOPN Inject 0.1 mLs (0.6 mg total) into the skin daily for 7 days, THEN 0.2 mLs (1.2 mg total) daily.  . [DISCONTINUED] gabapentin (NEURONTIN) 300 MG capsule TAKE 3 Capsules BY MOUTH EVERY NIGHT AT BEDTIME (Patient not taking: Reported on 11/29/2018)  . [DISCONTINUED] Tetrahydrozoline HCl (VISINE OP) Place 2 drops into both eyes daily as needed (for dry eyes).   No facility-administered encounter medications on file as of 11/29/2018.      ONCOLOGIC FAMILY HISTORY:  Family History  Problem Relation Age of Onset  . Breast cancer  Mother        stage IV at diagnosis  . Emphysema Mother        smoked  . Heart disease Mother   . COPD Father        smoked  . Asthma Father   . Heart disease Father   . Cancer Brother        Sinus     GENETIC COUNSELING/TESTING: Not at this time  SOCIAL HISTORY:  Social History   Socioeconomic History  . Marital status: Married    Spouse name: Not on file  . Number of children: 2  . Years of education: Not on file  . Highest education level: Not on file  Occupational History  . Occupation: Disabled  Social Needs  . Financial resource strain: Not on file  . Food insecurity:    Worry: Not on file    Inability: Not on file  . Transportation needs:    Medical: Not on file    Non-medical: Not on file  Tobacco Use  . Smoking status: Former Smoker    Packs/day: 1.50    Years: 30.00    Pack years: 45.00    Types: Cigarettes    Last attempt to quit: 12/29/2000    Years since quitting: 17.9  . Smokeless tobacco: Never Used  Substance and Sexual Activity  . Alcohol use: Yes    Alcohol/week: 1.0 standard drinks    Types: 1 Cans of beer per week    Frequency: Never    Comment: rarely  . Drug use: No  . Sexual activity: Not Currently    Birth control/protection: Surgical  Lifestyle  . Physical activity:    Days per week: Not on file    Minutes per session: Not on file  . Stress: Not on file  Relationships  . Social connections:    Talks on phone: Not on file    Gets together: Not on file    Attends religious service: Not on file    Active member of club or organization: Not on file    Attends meetings of clubs or organizations: Not on file    Relationship status:  Not on file  . Intimate partner violence:    Fear of current or ex partner: Not on file    Emotionally abused: Not on file    Physically abused: Not on file    Forced sexual activity: Not on file  Other Topics Concern  . Not on file  Social History Narrative  . Not on file      PHYSICAL  EXAMINATION:  Vital Signs:   Vitals:   11/29/18 1420  BP: (!) 164/72  Pulse: 92  Resp: 17  Temp: 98.9 F (37.2 C)  SpO2: 94%   Filed Weights   11/29/18 1420  Weight: (!) 321 lb 4.8 oz (145.7 kg)   General: Well-nourished, well-appearing female in no acute distress.  She is unaccompanied today.   HEENT: Head is normocephalic.  Pupils equal and reactive to light. Conjunctivae clear without exudate.  Sclerae anicteric. Oral mucosa is pink, moist.  Oropharynx is pink without lesions or erythema.  Lymph: No cervical, supraclavicular, or infraclavicular lymphadenopathy noted on palpation.  Cardiovascular: Regular rate and rhythm.Marland Kitchen Respiratory: Clear to auscultation bilaterally. Chest expansion symmetric; breathing non-labored.  Breasts: right breast s/p lumpectomy and radiation, underneath her right breast she is peeling, but it appears to be healing, no sign of local recurrence, left breast benign GI: Abdomen soft and round; non-tender, non-distended. Bowel sounds normoactive.  GU: Deferred.  Neuro: No focal deficits. Steady gait.  Psych: Mood and affect normal and appropriate for situation.  Extremities: No edema. MSK: No focal spinal tenderness to palpation.  Full range of motion in bilateral upper extremities Skin: Warm and dry.  LABORATORY DATA:  None for this visit.  DIAGNOSTIC IMAGING:  None for this visit.      ASSESSMENT AND PLAN:  Ms.. [name] is a pleasant 57 y.o. female with Stage IA right breast invasive ductal carcinoma, ER+/PR+/HER2-, diagnosed in 05/2018, treated with lumpectomy, adjuvant radiation therapy, and anti-estrogen therapy with Anastrozole beginning in 11/2018.  She presents to the Survivorship Clinic for our initial meeting and routine follow-up post-completion of treatment for breast cancer.    1. Stage IA right breast cancer:  [name] is continuing to recover from definitive treatment for breast cancer. She will follow-up with her medical  oncologist, Dr. Jana Hakim in 01/2019 with history and physical exam per surveillance protocol.  She will continue her anti-estrogen therapy with Anastrozole. Thus far, she is tolerating the Anastrozole well, with minimal side effects.  Today, a comprehensive survivorship care plan and treatment summary was reviewed with the patient today detailing her breast cancer diagnosis, treatment course, potential late/long-term effects of treatment, appropriate follow-up care with recommendations for the future, and patient education resources.  A copy of this summary, along with a letter will be sent to the patient's primary care provider via mail/fax/In Basket message after today's visit.    2. Bone health:  Given [name]'s age/history of breast cancer and her treatment with Anastrozole, she is at risk for bone demineralization. She has not yet undergone bone density testing and I ordered this to be done today.  She will have this done prior to her next appointment with Dr. Jana Hakim in 01/2019.  She was given education on specific activities to promote bone health.  3. Cancer screening:  Due to [name]'s history and her age, she should receive screening for skin cancers, colon cancer, and gynecologic cancers.  The information and recommendations are listed on the patient's comprehensive care plan/treatment summary and were reviewed in detail with the patient.  4. Health maintenance and wellness promotion: [name] was encouraged to consume 5-7 servings of fruits and vegetables per day. We reviewed the "Nutrition Rainbow" handout, as well as the handout "Take Control of Your Health and Reduce Your Cancer Risk" from the [place].  She was also encouraged to engage in moderate to vigorous exercise for 30 minutes per day most days of the week. We discussed the [employer] fitness program, which is designed for cancer survivors to help them become more physically fit after cancer treatments.   She was instructed to limit her alcohol consumption and continue to abstain from tobacco use.     5. Support services/counseling: It is not uncommon for this period of the patient's cancer care trajectory to be one of many emotions and stressors.  We discussed an opportunity for her to participate in the next session of [facility] ("Finding Your New Normal") support group series designed for patients after they have completed treatment.   [name] was encouraged to take advantage of our many other support services programs, support groups, and/or counseling in coping with her new life as a cancer survivor after completing anti-cancer treatment.  She was offered support today through active listening and expressive supportive counseling.  She was given information regarding our available services and encouraged to contact me with any questions or for help enrolling in any of our support group/programs.    Dispo:   -Return to cancer center 01/2019 for f/u with Dr. Jana Hakim -Mammogram due in 04/2019 -Bone density 12/2018 at [facility] -She is welcome to return back to the Survivorship Clinic at any time; no additional follow-up needed at this time.  -Consider referral back to survivorship as a long-term survivor for continued surveillance  A total of (30) minutes of face-to-face time was spent with this patient with greater than 50% of that time in counseling and care-coordination.   Gardenia Phlegm, [place] [phone]   Note: PRIMARY CARE PROVIDER Lorella Nimrod, [place] [phone]

## 2018-11-30 ENCOUNTER — Telehealth: Payer: Self-pay | Admitting: Adult Health

## 2018-11-30 NOTE — Telephone Encounter (Signed)
Per 12/2 los no los

## 2018-12-01 ENCOUNTER — Encounter: Payer: Self-pay | Admitting: *Deleted

## 2018-12-06 ENCOUNTER — Ambulatory Visit
Admission: RE | Admit: 2018-12-06 | Discharge: 2018-12-06 | Disposition: A | Discharge status: Home / Self Care | Payer: Medicaid Other | Source: Ambulatory Visit | Attending: Radiation Oncology | Admitting: Radiation Oncology

## 2018-12-06 ENCOUNTER — Encounter: Payer: Self-pay | Admitting: Radiation Oncology

## 2018-12-06 ENCOUNTER — Other Ambulatory Visit: Payer: Self-pay

## 2018-12-06 VITALS — BP 165/94 | HR 91 | Temp 98.0°F | Resp 20 | Ht 60.0 in | Wt 320.0 lb

## 2018-12-06 DIAGNOSIS — J449 Chronic obstructive pulmonary disease, unspecified: Secondary | ICD-10-CM | POA: Insufficient documentation

## 2018-12-06 DIAGNOSIS — D0511 Intraductal carcinoma in situ of right breast: Secondary | ICD-10-CM | POA: Diagnosis present

## 2018-12-06 DIAGNOSIS — Z79899 Other long term (current) drug therapy: Secondary | ICD-10-CM | POA: Diagnosis not present

## 2018-12-06 DIAGNOSIS — Z170 Estrogen receptor positive status [ER+]: Secondary | ICD-10-CM

## 2018-12-06 DIAGNOSIS — C50411 Malignant neoplasm of upper-outer quadrant of right female breast: Secondary | ICD-10-CM

## 2018-12-06 NOTE — Progress Notes (Signed)
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 Her supraclavicular nodes were not treated due to need for prone positioning to avoid her lungs given her severe COPD. This was delivered using a 3-D conformal technique. The patient then received a boost to the seroma. This delivered an additional 10 Gy in 5 fractions using 15x photons with a Complex Isodose technique. The total dose was 60.4 Gy.  Narrative:  The patient returns today for routine follow-up. During treatment she did very well with radiotherapy and but did have significant desquamation along the axilla.                             On review of systems, the patient states she's feeling well but her skin is still recovering from her therapy. She is using Neosporin with pain relief as well. She has started antiestrogen therapy and has had some hot flashes. No other complaints are noted.  ALLERGIES:  has No Known Allergies.  Meds: Current Outpatient Medications  Medication Sig Dispense Refill  . albuterol (PROVENTIL) (2.5 MG/3ML) 0.083% nebulizer solution INHALE 1 VIAL VIA NEBULIZER EVERY FOUR HOURS AS NEEDED FOR WHEEZING OR SHORTNESS OF BREATH 180 mL 2  . Alirocumab (PRALUENT) 150 MG/ML SOPN Inject 150 mg into the skin every 14 (fourteen) days. 2 pen 11  . anastrozole (ARIMIDEX) 1 MG tablet Take 1 tablet (1 mg total) by mouth daily. 90 tablet 12  . ASPIRIN ADULT LOW STRENGTH 81 MG EC  tablet TAKE 1 Tablet BY MOUTH ONCE DAILY 90 tablet 3  . atorvastatin (LIPITOR) 80 MG tablet TAKE 1 Tablet BY MOUTH ONCE DAILY 90 tablet 3  . Blood Glucose Monitoring Suppl (ACCU-CHEK AVIVA CONNECT) w/Device KIT 1 kit by Does not apply route 2 (two) times daily at 8 am and 10 pm. 1 kit 0  . clopidogrel (PLAVIX) 75 MG tablet TAKE 1 Tablet BY MOUTH EVERY MORNING 90 tablet 1  . DULoxetine (CYMBALTA) 30 MG capsule Take 1 capsule (30 mg total) by mouth 2 (two) times daily. 60 capsule 6  . Fluticasone-Umeclidin-Vilant (TRELEGY ELLIPTA) 100-62.5-25 MCG/INH AEPB Inhale 1 puff into the lungs daily. 1 each 11  . furosemide (LASIX) 40 MG tablet TAKE 1 Tablet  BY MOUTH TWICE DAILY AS NEEDED FOR SWELLING 60 tablet 3  . gabapentin (NEURONTIN) 300 MG capsule Take 3 capsules (900 mg total) by mouth at bedtime. 90 capsule 1  . glucose blood (ACCU-CHEK AVIVA PLUS) test strip Use to test blood sugar 2 times daily. diag code E11.9. Non insulin dependent, A1C greater than 9. 300 each 1  . ibuprofen (ADVIL,MOTRIN) 200 MG tablet Take 400 mg by mouth every 6 (six) hours as needed for moderate pain.    . Lancets (ACCU-CHEK SOFT TOUCH) lancets Use as instructed 100 each 12  . liraglutide (VICTOZA) 18 MG/3ML SOPN Inject 18 mg into the skin.    Marland Kitchen losartan (COZAAR) 100  MG tablet TAKE 1 Tablet BY MOUTH ONCE DAILY 90 tablet 1  . meloxicam (MOBIC) 15 MG tablet TAKE 1 TABLET BY MOUTH EVERY DAY FOR 7 DAYS - STOP CLOPIDOGREL FOR THOSE 7 DAYS - THEN TAKE 1 TABLET DAILY ONLY IF NEEDED AS DIRECTED (Patient taking differently: Take 15 mg by mouth daily as needed for pain) 30 tablet 0  . metFORMIN (GLUMETZA) 1000 MG (MOD) 24 hr tablet Take 1 tablet (1,000 mg total) by mouth 2 (two) times daily. 180 tablet 3  . metoprolol tartrate (LOPRESSOR) 25 MG tablet TAKE 1 Tablet  BY MOUTH TWICE DAILY 180 tablet 1  . OXYGEN Inhale 3 L into the lungs daily.    . potassium chloride SA (K-DUR,KLOR-CON) 20 MEQ tablet TAKE 2 Tablets BY MOUTH EVERY NIGHT AT  BEDTIME DO NOT TAKE IF YOU DO NOT TAKE FUROSEMIDE 60 tablet 0  . PROVENTIL HFA 108 (90 Base) MCG/ACT inhaler INHALE 2 PUFFS BY MOUTH EVERY 4 HOURS AS NEEDED FOR COUGHING, WHEEZING, OR SHORTNESS OF BREATH 13.4 g 2  . ZETIA 10 MG tablet TAKE 1 Tablet BY MOUTH ONCE DAILY 90 tablet 3  . hydrochlorothiazide (HYDRODIURIL) 25 MG tablet Take 1 tablet (25 mg total) by mouth daily. 90 tablet 0  . liraglutide (VICTOZA) 18 MG/3ML SOPN Inject 0.1 mLs (0.6 mg total) into the skin daily for 7 days, THEN 0.2 mLs (1.2 mg total) daily. 6.7 mL 5  . NITROSTAT 0.4 MG SL tablet PLACE 1 TABLET UNDER TONGUE AS NEEDED FOR CHEST PAIN EVERY 5 MINUTES X 3 MAX DOSES.  CALL 911 IF PAIN PERSISTS (Patient not taking: Reported on 12/06/2018) 100 tablet 0   No current facility-administered medications for this encounter.     Physical Findings:  height is 5' (1.524 m) and weight is 320 lb (145.2 kg) (abnormal). Her oral temperature is 98 F (36.7 C). Her blood pressure is 165/94 (abnormal) and her pulse is 91. Her respiration is 20 and oxygen saturation is 92%.  Pain Assessment Pain Score: 1  Pain Loc: Breast/10 In general this is a well appearing caucasian female in no acute distress. She's alert and oriented x4 and appropriate throughout the examination. Cardiopulmonary assessment is negative for acute distress and she exhibits normal effort. The right breast was examined and reveals mild hyperpigmentation. There is mild dry desquamation in the right axilla and no additional desquamation is noted.   Lab Findings: Lab Results  Component Value Date   WBC 7.0 07/02/2018   HGB 12.7 07/02/2018   HCT 40.5 07/02/2018   MCV 83.7 07/02/2018   PLT 220 07/02/2018     Radiographic Findings: No results found.  Impression/Plan: 1. 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 The patient has been doing well since completion of  radiotherapy. We discussed that we would be happy to continue to follow her as needed, but she will also continue to follow up with Dr. Jana Hakim in medical oncology. She was counseled on skin care as well as measures to avoid sun exposure to this area.  2. Survivorship. We discussed the importance of survivorship evaluation and she is not yet but will be scheduled in the near future. She was also given the monthly calendar for access to resources offered within the cancer center.     Carola Rhine, PAC

## 2018-12-08 ENCOUNTER — Other Ambulatory Visit: Payer: Self-pay | Admitting: Internal Medicine

## 2018-12-08 DIAGNOSIS — F3342 Major depressive disorder, recurrent, in full remission: Secondary | ICD-10-CM

## 2018-12-08 DIAGNOSIS — J441 Chronic obstructive pulmonary disease with (acute) exacerbation: Secondary | ICD-10-CM

## 2018-12-23 ENCOUNTER — Telehealth: Payer: Self-pay

## 2018-12-23 NOTE — Telephone Encounter (Signed)
TC from Pt. Stating she is taking anastrazole and feels she is having side effects from this medication Pt. Stated she was feeling nausea, loss of appetite, constipation, diarrhea, and dizziness at times. Told Pt. To stop taking medication per Dr.Gudena's recommendation and left an in basket message for Mendel Ryder to return call to Pt on Friday 12/24/18 scheduling note sent to make an appointment for Monday with Mendel Ryder.

## 2018-12-27 ENCOUNTER — Inpatient Hospital Stay: Payer: Medicaid Other

## 2018-12-27 ENCOUNTER — Ambulatory Visit: Payer: Medicaid Other | Admitting: Adult Health

## 2018-12-27 ENCOUNTER — Telehealth: Payer: Self-pay | Admitting: Oncology

## 2018-12-27 ENCOUNTER — Inpatient Hospital Stay (HOSPITAL_BASED_OUTPATIENT_CLINIC_OR_DEPARTMENT_OTHER): Payer: Medicaid Other | Admitting: Adult Health

## 2018-12-27 ENCOUNTER — Telehealth: Payer: Self-pay

## 2018-12-27 ENCOUNTER — Encounter: Payer: Self-pay | Admitting: Adult Health

## 2018-12-27 VITALS — BP 114/64 | HR 112 | Temp 97.6°F | Resp 18 | Ht 60.0 in | Wt 310.2 lb

## 2018-12-27 DIAGNOSIS — Z87891 Personal history of nicotine dependence: Secondary | ICD-10-CM

## 2018-12-27 DIAGNOSIS — Z79899 Other long term (current) drug therapy: Secondary | ICD-10-CM

## 2018-12-27 DIAGNOSIS — R232 Flushing: Secondary | ICD-10-CM

## 2018-12-27 DIAGNOSIS — E119 Type 2 diabetes mellitus without complications: Secondary | ICD-10-CM

## 2018-12-27 DIAGNOSIS — E785 Hyperlipidemia, unspecified: Secondary | ICD-10-CM

## 2018-12-27 DIAGNOSIS — C50411 Malignant neoplasm of upper-outer quadrant of right female breast: Secondary | ICD-10-CM

## 2018-12-27 DIAGNOSIS — I110 Hypertensive heart disease with heart failure: Secondary | ICD-10-CM

## 2018-12-27 DIAGNOSIS — Z923 Personal history of irradiation: Secondary | ICD-10-CM

## 2018-12-27 DIAGNOSIS — G2581 Restless legs syndrome: Secondary | ICD-10-CM

## 2018-12-27 DIAGNOSIS — Z170 Estrogen receptor positive status [ER+]: Principal | ICD-10-CM

## 2018-12-27 DIAGNOSIS — R112 Nausea with vomiting, unspecified: Secondary | ICD-10-CM

## 2018-12-27 DIAGNOSIS — I252 Old myocardial infarction: Secondary | ICD-10-CM

## 2018-12-27 DIAGNOSIS — R197 Diarrhea, unspecified: Secondary | ICD-10-CM | POA: Diagnosis not present

## 2018-12-27 DIAGNOSIS — Z808 Family history of malignant neoplasm of other organs or systems: Secondary | ICD-10-CM

## 2018-12-27 DIAGNOSIS — Z6841 Body Mass Index (BMI) 40.0 and over, adult: Secondary | ICD-10-CM

## 2018-12-27 DIAGNOSIS — Z87442 Personal history of urinary calculi: Secondary | ICD-10-CM

## 2018-12-27 DIAGNOSIS — J449 Chronic obstructive pulmonary disease, unspecified: Secondary | ICD-10-CM

## 2018-12-27 DIAGNOSIS — Z803 Family history of malignant neoplasm of breast: Secondary | ICD-10-CM

## 2018-12-27 DIAGNOSIS — Z86711 Personal history of pulmonary embolism: Secondary | ICD-10-CM

## 2018-12-27 DIAGNOSIS — I2510 Atherosclerotic heart disease of native coronary artery without angina pectoris: Secondary | ICD-10-CM

## 2018-12-27 DIAGNOSIS — G4730 Sleep apnea, unspecified: Secondary | ICD-10-CM

## 2018-12-27 DIAGNOSIS — R200 Anesthesia of skin: Secondary | ICD-10-CM

## 2018-12-27 DIAGNOSIS — F329 Major depressive disorder, single episode, unspecified: Secondary | ICD-10-CM

## 2018-12-27 DIAGNOSIS — I5032 Chronic diastolic (congestive) heart failure: Secondary | ICD-10-CM

## 2018-12-27 LAB — CBC WITH DIFFERENTIAL (CANCER CENTER ONLY)
Abs Immature Granulocytes: 0.05 10*3/uL (ref 0.00–0.07)
Basophils Absolute: 0.1 10*3/uL (ref 0.0–0.1)
Basophils Relative: 1 %
EOS PCT: 11 %
Eosinophils Absolute: 0.8 10*3/uL — ABNORMAL HIGH (ref 0.0–0.5)
HCT: 38.4 % (ref 36.0–46.0)
Hemoglobin: 12.6 g/dL (ref 12.0–15.0)
Immature Granulocytes: 1 %
Lymphocytes Relative: 18 %
Lymphs Abs: 1.3 10*3/uL (ref 0.7–4.0)
MCH: 26.6 pg (ref 26.0–34.0)
MCHC: 32.8 g/dL (ref 30.0–36.0)
MCV: 81 fL (ref 80.0–100.0)
MONO ABS: 0.8 10*3/uL (ref 0.1–1.0)
Monocytes Relative: 10 %
Neutro Abs: 4.4 10*3/uL (ref 1.7–7.7)
Neutrophils Relative %: 59 %
Platelet Count: 281 10*3/uL (ref 150–400)
RBC: 4.74 MIL/uL (ref 3.87–5.11)
RDW: 13.4 % (ref 11.5–15.5)
WBC Count: 7.3 10*3/uL (ref 4.0–10.5)
nRBC: 0 % (ref 0.0–0.2)

## 2018-12-27 LAB — CMP (CANCER CENTER ONLY)
ALK PHOS: 69 U/L (ref 38–126)
ALT: 13 U/L (ref 0–44)
AST: 16 U/L (ref 15–41)
Albumin: 3.4 g/dL — ABNORMAL LOW (ref 3.5–5.0)
Anion gap: 13 (ref 5–15)
BUN: 10 mg/dL (ref 6–20)
CO2: 30 mmol/L (ref 22–32)
Calcium: 9.7 mg/dL (ref 8.9–10.3)
Chloride: 93 mmol/L — ABNORMAL LOW (ref 98–111)
Creatinine: 0.82 mg/dL (ref 0.44–1.00)
GFR, Est AFR Am: >60 mL/min (ref >60)
GFR, Estimated: >60 mL/min (ref >60)
Glucose, Bld: 146 mg/dL — ABNORMAL HIGH (ref 70–99)
Potassium: 3.2 mmol/L — ABNORMAL LOW (ref 3.5–5.1)
Sodium: 136 mmol/L (ref 135–145)
TOTAL PROTEIN: 7.4 g/dL (ref 6.5–8.1)
Total Bilirubin: 1.4 mg/dL — ABNORMAL HIGH (ref 0.3–1.2)

## 2018-12-27 NOTE — Telephone Encounter (Signed)
Spoke with patient informing of slightly low potassium level.  Patient states that she takes potassium daily 20 meq, 2 tabs at bedtime.  Encouraged patient to eat more potassium rich foods.  Patient voiced understanding.  No questions at this time.

## 2018-12-27 NOTE — Telephone Encounter (Signed)
Gave avs and calendar ° °

## 2018-12-27 NOTE — Progress Notes (Signed)
[place]  [phone] [phone]     ID: [name] DOB: 1961-04-30  MR#: [id number]  POE#:[id number]  Patient Care Team: Lorella Nimrod, MD as PCP - General (Internal Medicine) Burnell Blanks, MD as PCP - Cardiology (Cardiology) Alphonsa Overall, MD as Consulting Physician (General Surgery) Magrinat, Virgie Dad, MD as Consulting Physician (Oncology) Kyung Rudd, MD as Consulting Physician (Radiation Oncology) Burnell Blanks, MD as Consulting Physician (Cardiology) Delice Bison, Charlestine Massed, NP as Nurse Practitioner (Hematology and Oncology) OTHER MD:   CHIEF COMPLAINT: Estrogen receptor positive breast cancer  CURRENT TREATMENT: adjuvant anti estrogen therapy.   HISTORY OF CURRENT ILLNESS: On the original intake note:  [name] had routine screening mammography on 05/27/2018 showing a possible abnormality in the right breast. She underwent unilateral right  diagnostic mammography with tomography and right breast ultrasonography at [facility] on 06/02/2018 showing: breast density category B. An area of distortion is identified in the upper outer quadrant of the right breast mammographically. Ultrasonography of this area found an irregular hypoechoic mass at the 11 o'clock upper outer quadrant measuring 1.8 x 1.8 x 1.4 cm and located 6 cm from the nipple. The right axilla is negative sonographically for abnormal lymph nodes.   Accordingly on 06/04/2018 she proceeded to biopsy of the right breast area in question. The pathology from this procedure showed ([id number]): Invasive ductal carcinoma grade 1 measuring 1.0 cm in greatest extent, with microcalcifications. Ductal carcinoma in situ high grade. Prognostic indicators significant for: estrogen receptor, 100% positive and progesterone receptor, 100% positive, both with strong staining intensity. Proliferation marker Ki67 at 8%. HER2  not amplified with ratios  HER2/CEP17 signals 1.32 and average HER2 copies per cell 1.85  The patient's subsequent history is as detailed below.  INTERVAL HISTORY: [name] presents to the office today for follow up of her estrogen receptor positive breast cancer.  She was taking Anastrozole daily since 12/1.  Initially she was constipated, and so she took something for constipation.  Then, about 2-3 weeks ago she developed diarrhea.  She was going 3-4 times per day, typically prompted by PO intake.  She then developed weakness and a washed out feeling.  She was nauseated and vomited twice.  She has increased her PO intake of water.  She called [place] and stopped the anastrozole about 1 week ago.  She notes feeling improved ever since.      REVIEW OF SYSTEMS: [name] denies fevers or chills.  She denies any abdominal pain, abdominal distention, or cramping.  She says her symptoms resolved after stopping the Anastrozole.  She denies chest pain, shortness of breath, or palpitations.  She denies headaches or vision changes.  A detailed ROS was otherwise non contributory.    PAST MEDICAL HISTORY: Past Medical History:  Diagnosis Date  . Anemia   . Aortic valve stenosis, severe   . 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 Asthma   . Blood transfusion    2013[facility]  . Breast cancer in female [facility])    Right  . CAD (coronary artery disease)    Cath 2010 with DES x 1 RCA-- PT'S CARDIOLOGIST IS DR. [place]  . Chronic diastolic congestive heart failure ([place])   . COPD (chronic obstructive pulmonary disease) ([place])   . Depression   . Diabetes mellitus DIAGNOSED IN2010   Controll  s with diet  . Eczema    on back  . Hyperlipidemia   . Hypertension   . Kidney stones   . Morbid obesity with body mass index of 60.0-69.9 in adult [facility])   . Myocardial infarction ([place])    PT THINKS SHE WAS DX WITH MI AT THE TIME OF HEART  STENTING  . Pulmonary embolism ([place]) 02/08/12   S/P RT TOTAL KNEE ON 02/03/12--ON 02/08/12--DEVELOPED ACUTE SOB AND CHEST PAIN--AND DIAGNOSED WITH  PULMONARY EMBOLUS AND PNEUMONIA  . Restless leg syndrome   . Sleep apnea    uses 3 liters O2 at night   . Uterine fibroid    NO PROBLEMS AT PRESENT FROM THE FIBROIDS-STATES SHE IS POST MENOPAUSAL-LAST MENSES 2010 EXCEPT FOR EPISODE THIS YR OF BLEEDING RELATED TO FIBROIDS.  Marland Kitchen Weakness    BOTH HANDS - S/P BILATERAL CARPAL TUNNEL RELEASE--BUT STILL HAS WEAKNESS--OFTEN DROPS THINGS  Migraines at younger age. MI in 2009  PAST SURGICAL HISTORY: Past Surgical History:  Procedure Laterality Date  . BREAST LUMPECTOMY WITH RADIOACTIVE SEED AND SENTINEL LYMPH NODE BIOPSY Right 07/19/2018   Procedure: BREAST LUMPECTOMY WITH RADIOACTIVE SEED AND SENTINEL LYMPH NODE BIOPSY;  Surgeon: Alphonsa Overall, MD;  Location: [place];  Service: General;  Laterality: Right;  . CARDIAC CATHETERIZATION    . CARPAL TUNNEL RELEASE     Bilateral  . CHOLECYSTECTOMY    . CORONARY ANGIOPLASTY     2010 has stent in place  . CYSTOSCOPY W/ RETROGRADES Right 09/21/2013   Procedure: CYSTOSCOPY WITH RIGHT RETROGRADE PYELOGRAM RIGHT DOUBLE J STENT ;  Surgeon: Fredricka Bonine, MD;  Location[facility];  Service: Urology;  Laterality: Right;  . CYSTOSCOPY WITH URETEROSCOPY AND STENT PLACEMENT Right 10/25/2013   Procedure: CYSTOSCOPY RIGHT URETEROSCOPY HOLMIUM LASER LITHO AND STENT PLACEMENT;  Surgeon: Fredricka Bonine, MD;  Location[facility];  Service: Urology;  Laterality: Right;  . HERNIA REPAIR    . INTRAOPERATIVE TRANSESOPHAGEAL ECHOCARDIOGRAM N/A 12/12/2014   Procedure: INTRAOPERATIVE TRANSESOPHAGEAL ECHOCARDIOGRAM;  Surgeon: Burnell Blanks, MD;  Location[facility] OR;  Service: Cardiovascular;  Laterality: N/A;  . JOINT REPLACEMENT     bil total knees  . KNEE ARTHROPLASTY  02/03/2012   Procedure: COMPUTER ASSISTED TOTAL KNEE ARTHROPLASTY;  Surgeon: Mcarthur Rossetti,  MD;  Location: [place];  Service: Orthopedics;  Laterality: Right;  Right total knee arthroplasty  . LEFT AND RIGHT HEART CATHETERIZATION WITH CORONARY ANGIOGRAM N/A 03/17/2013   Procedure: LEFT AND RIGHT HEART CATHETERIZATION WITH CORONARY ANGIOGRAM;  Surgeon: Burnell Blanks, MD;  Location: [facility] CATH LAB;  Service: Cardiovascular;  Laterality: N/A;  . LEFT AND RIGHT HEART CATHETERIZATION WITH CORONARY/GRAFT ANGIOGRAM N/A 09/14/2014   Procedure: LEFT AND RIGHT HEART CATHETERIZATION WITH Beatrix Fetters;  Surgeon: Burnell Blanks, MD;  Location: [facility] CATH LAB;  Service: Cardiovascular;  Laterality: N/A;  . TEE WITHOUT CARDIOVERSION N/A 03/14/2013   Procedure: TRANSESOPHAGEAL ECHOCARDIOGRAM (TEE);  Surgeon: Lelon Perla, MD;  Location: [facility] ENDOSCOPY;  Service: Cardiovascular;  Laterality: N/A;  . TEE WITHOUT CARDIOVERSION N/A 11/14/2014   Procedure: TRANSESOPHAGEAL ECHOCARDIOGRAM (TEE);  Surgeon: Lelon Perla, MD;  Location: [facility] ENDOSCOPY;  Service: Cardiovascular;  Laterality: N/A;  . TOTAL KNEE ARTHROPLASTY  09/10/2012   Procedure: TOTAL KNEE ARTHROPLASTY;  Surgeon: Mcarthur Rossetti, MD;  [facility];  Service: Orthopedics;  Laterality: Left;  . TOTAL KNEE REVISION Right 07/15/2013   Procedure: REVISION ARTHROPLASTY RIGHT KNEE;  Surgeon: Mcarthur Rossetti, MD;  [facility];  Service:  Orthopedics;  Laterality: Right;  . TRANSCATHETER AORTIC VALVE REPLACEMENT, TRANSFEMORAL N/A 12/12/2014   Procedure: TRANSCATHETER AORTIC VALVE REPLACEMENT, TRANSFEMORAL;  Surgeon: Burnell Blanks, MD;  Location: [place];  Service: Cardiovascular;  Laterality: N/A;  . TRIGGER FINGER RELEASE  09/10/2012   Procedure: RELEASE TRIGGER FINGER/A-1 PULLEY;  Surgeon: Mcarthur Rossetti, MD;  [facility];  Service: Orthopedics;  Laterality: Right;  Right Ring Finger  . TUBAL LIGATION    Bovine Mitral Aortic valve surgery 2015   FAMILY HISTORY Family History  Problem  Relation Age of Onset  . Breast cancer Mother        stage IV at diagnosis  . Emphysema Mother        smoked  . Heart disease Mother   . COPD Father        smoked  . Asthma Father   . Heart disease Father   . Cancer Brother        Sinus  The patient's father died at age 87 due to emphysema and possibly cancer . The patient's mother died at age 44 due to breast cancer with stage IV disease at presentation. The patient has 2 full and 1 half brothers and 1 sister.  One of the patient's brothers had nasopharyngeal cancer stage IV diagnosed at age 65. There was also a paternal great grandfather with prostate cancer. The patient denies a family history of ovarian cancer.    GYNECOLOGIC HISTORY:  Patient's last menstrual period was 02/03/2012. Menarche: 57 years old Age at first live birth: 57 years old She is [place] P2. Her LMP was around age 52. She used oral contraception in her 97'L with no complications.    SOCIAL HISTORY:  Alegra is disabled. Her husband, Arnette Norris is retired from working in Health and safety inspector from [employer], Estate agent, and smoke damage. The patient's son, Christia Reading 57 lives in [place] and works as a Biomedical scientist. The patient's son Legrand Como 30 lives in [place] and is a Environmental education officer of a [employer]. The patient has no grandchildren. She attends [employer].     ADVANCED DIRECTIVES: not in place   HEALTH MAINTENANCE: Social History   Tobacco Use  . Smoking status: Former Smoker    Packs/day: 1.50    Years: 30.00    Pack years: 45.00    Types: Cigarettes    Last attempt to quit: 12/29/2000    Years since quitting: 18.0  . Smokeless tobacco: Never Used  Substance Use Topics  . Alcohol use: Yes    Alcohol/week: 1.0 standard drinks    Types: 1 Cans of beer per week    Frequency: Never    Comment: rarely  . Drug use: No     Colonoscopy: about 8 year ago  PAP: 10/20/2016 normal   Bone density: no   No Known Allergies  Current Outpatient Medications   Medication Sig Dispense Refill  . albuterol (PROVENTIL) (2.5 MG/3ML) 0.083% nebulizer solution INHALE 1 VIAL VIA NEBULIZER EVERY FOUR HOURS AS NEEDED FOR WHEEZING OR SHORTNESS OF BREATH 180 mL 2  . Alirocumab (PRALUENT) 150 MG/ML SOPN Inject 150 mg into the skin every 14 (fourteen) days. 2 pen 11  . ASPIRIN ADULT LOW STRENGTH 81 MG EC tablet TAKE 1 Tablet BY MOUTH ONCE DAILY 90 tablet 3  . atorvastatin (LIPITOR) 80 MG tablet TAKE 1 Tablet BY MOUTH ONCE DAILY 90 tablet 3  . Blood Glucose Monitoring Suppl (ACCU-CHEK AVIVA CONNECT) w/Device KIT 1 kit by Does not apply route 2 (two)  times daily at 8 am and 10 pm. 1 kit 0  . clopidogrel (PLAVIX) 75 MG tablet TAKE 1 Tablet BY MOUTH EVERY MORNING 90 tablet 1  . CYMBALTA 30 MG capsule TAKE 1 Capsule  BY MOUTH TWICE DAILY 60 capsule 6  . Fluticasone-Umeclidin-Vilant (TRELEGY ELLIPTA) 100-62.5-25 MCG/INH AEPB Inhale 1 puff into the lungs daily. 1 each 11  . furosemide (LASIX) 40 MG tablet TAKE 1 Tablet  BY MOUTH TWICE DAILY AS NEEDED FOR SWELLING 60 tablet 3  . gabapentin (NEURONTIN) 300 MG capsule Take 3 capsules (900 mg total) by mouth at bedtime. 90 capsule 1  . glucose blood (ACCU-CHEK AVIVA PLUS) test strip Use to test blood sugar 2 times daily. diag code E11.9. Non insulin dependent, A1C greater than 9. 300 each 1  . hydrochlorothiazide (HYDRODIURIL) 25 MG tablet TAKE 1 Tablet BY MOUTH ONCE DAILY 90 tablet 0  . ibuprofen (ADVIL,MOTRIN) 200 MG tablet Take 400 mg by mouth every 6 (six) hours as needed for moderate pain.    . Lancets (ACCU-CHEK SOFT TOUCH) lancets Use as instructed 100 each 12  . liraglutide (VICTOZA) 18 MG/3ML SOPN Inject 18 mg into the skin.    Marland Kitchen losartan (COZAAR) 100 MG tablet TAKE 1 Tablet BY MOUTH ONCE DAILY 90 tablet 1  . meloxicam (MOBIC) 15 MG tablet TAKE 1 TABLET BY MOUTH EVERY DAY FOR 7 DAYS - STOP CLOPIDOGREL FOR THOSE 7 DAYS - THEN TAKE 1 TABLET DAILY ONLY IF NEEDED AS DIRECTED (Patient taking differently: Take 15 mg by mouth  daily as needed for pain) 30 tablet 0  . metFORMIN (GLUMETZA) 1000 MG (MOD) 24 hr tablet Take 1 tablet (1,000 mg total) by mouth 2 (two) times daily. 180 tablet 3  . metoprolol tartrate (LOPRESSOR) 25 MG tablet TAKE 1 Tablet  BY MOUTH TWICE DAILY 180 tablet 1  . NITROSTAT 0.4 MG SL tablet PLACE 1 TABLET UNDER TONGUE AS NEEDED FOR CHEST PAIN EVERY 5 MINUTES X 3 MAX DOSES.  CALL 911 IF PAIN PERSISTS 100 tablet 0  . OXYGEN Inhale 3 L into the lungs daily.    . potassium chloride SA (K-DUR,KLOR-CON) 20 MEQ tablet TAKE 2 Tablets BY MOUTH EVERY NIGHT AT BEDTIME DO NOT TAKE IF YOU DO NOT TAKE FUROSEMIDE 60 tablet 0  . PROVENTIL HFA 108 (90 Base) MCG/ACT inhaler INHALE 2 PUFFS BY MOUTH EVERY 4 HOURS AS NEEDED FOR COUGHING, WHEEZING, OR SHORTNESS OF BREATH 13.4 g 2  . ZETIA 10 MG tablet TAKE 1 Tablet BY MOUTH ONCE DAILY 90 tablet 3  . anastrozole (ARIMIDEX) 1 MG tablet Take 1 tablet (1 mg total) by mouth daily. (Patient not taking: Reported on 12/27/2018) 90 tablet 12  . liraglutide (VICTOZA) 18 MG/3ML SOPN Inject 0.1 mLs (0.6 mg total) into the skin daily for 7 days, THEN 0.2 mLs (1.2 mg total) daily. 6.7 mL 5   No current facility-administered medications for this visit.     OBJECTIVE:   Vitals:   12/27/18 1156  BP: 114/64  Pulse: (!) 112  Resp: 18  Temp: 97.6 F (36.4 C)  SpO2: 93%     Body mass index is 60.58 kg/m.   Wt Readings from Last 3 Encounters:  12/27/18 (!) 310 lb 3.2 oz (140.7 kg)  12/06/18 (!) 320 lb (145.2 kg)  11/29/18 (!) 321 lb 4.8 oz (145.7 kg)      ECOG FS:3 - Symptomatic, >50% confined to bed  GENERAL: Patient is a well appearing female in no acute distress HEENT:  Sclerae anicteric.  Oropharynx clear and moist. No ulcerations or evidence of oropharyngeal candidiasis. Neck is supple.  NODES:  No cervical, supraclavicular, or axillary lymphadenopathy palpated.  BREAST EXAM:  Deferred. LUNGS:  Clear to auscultation bilaterally.  No wheezes or rhonchi. HEART:  Regular  rate and rhythm. HR 100 on exam.  No murmur appreciated. ABDOMEN:  Soft, nontender.  Positive, normoactive bowel sounds. No organomegaly palpated. MSK:  No focal spinal tenderness to palpation. Full range of motion bilaterally in the upper extremities. EXTREMITIES:  No peripheral edema.   SKIN:  Clear with no obvious rashes or skin changes. No nail dyscrasia. NEURO:  Nonfocal. Well oriented.  Appropriate affect.    LAB RESULTS:  CMP     Component Value Date/Time   NA 136 12/27/2018 1237   K 3.2 (L) 12/27/2018 1237   CL 93 (L) 12/27/2018 1237   CO2 30 12/27/2018 1237   GLUCOSE 146 (H) 12/27/2018 1237   BUN 10 12/27/2018 1237   CREATININE 0.82 12/27/2018 1237   CREATININE 0.87 07/09/2012 1603   CALCIUM 9.7 12/27/2018 1237   PROT 7.4 12/27/2018 1237   PROT 7.0 08/06/2018 1323   ALBUMIN 3.4 (L) 12/27/2018 1237   ALBUMIN 4.2 08/06/2018 1323   AST 16 12/27/2018 1237   ALT 13 12/27/2018 1237   ALKPHOS 69 12/27/2018 1237   BILITOT 1.4 (H) 12/27/2018 1237   GFRNONAA >60 12/27/2018 1237   GFRAA >60 12/27/2018 1237    No results found for: TOTALPROTELP, ALBUMINELP, A1GS, A2GS, BETS, BETA2SER, GAMS, MSPIKE, SPEI  No results found for: KPAFRELGTCHN, LAMBDASER, KAPLAMBRATIO  Lab Results  Component Value Date   WBC 7.3 12/27/2018   NEUTROABS 4.4 12/27/2018   HGB 12.6 12/27/2018   HCT 38.4 12/27/2018   MCV 81.0 12/27/2018   PLT 281 12/27/2018    '@LASTCHEMISTRY' @  No results found for: LABCA2  No components found for: [id number]  No results for input(s): INR in the last 168 hours.  No results found for: LABCA2  No results found for: UXN235  No results found for: TDD220  No results found for: URK270  No results found for: CA2729  No components found for: HGQUANT  No results found for: CEA1 / No results found for: CEA1   No results found for: AFPTUMOR  No results found for: CHROMOGRNA  No results found for: PSA1  Appointment on 12/27/2018  Component Date Value  Ref Range Status  . Sodium 12/27/2018 136  135 - 145 mmol/L Final  . Potassium 12/27/2018 3.2* 3.5 - 5.1 mmol/L Final  . Chloride 12/27/2018 93* 98 - 111 mmol/L Final  . CO2 12/27/2018 30  22 - 32 mmol/L Final  . Glucose, Bld 12/27/2018 146* 70 - 99 mg/dL Final  . BUN 12/27/2018 10  6 - 20 mg/dL Final  . Creatinine 12/27/2018 0.82  0.44 - 1.00 mg/dL Final  . Calcium 12/27/2018 9.7  8.9 - 10.3 mg/dL Final  . Total Protein 12/27/2018 7.4  6.5 - 8.1 g/dL Final  . Albumin 12/27/2018 3.4* 3.5 - 5.0 g/dL Final  . AST 12/27/2018 16  15 - 41 U/L Final  . ALT 12/27/2018 13  0 - 44 U/L Final  . Alkaline Phosphatase 12/27/2018 69  38 - 126 U/L Final  . Total Bilirubin 12/27/2018 1.4* 0.3 - 1.2 mg/dL Final  . GFR, Est Non Af Am 12/27/2018 >60  >60 mL/min Final  . GFR, Est AFR Am 12/27/2018 >60  >60 mL/min Final  . Anion gap 12/27/2018 13  5 - 15 Final   Performed at [facility], [place] [address] [place], [place] [address]  . WBC Count 12/27/2018 7.3  4.0 - 10.5 K/uL Final  . RBC 12/27/2018 4.74  3.87 - 5.11 MIL/uL Final  . Hemoglobin 12/27/2018 12.6  12.0 - 15.0 g/dL Final  . HCT 12/27/2018 38.4  36.0 - 46.0 % Final  . MCV 12/27/2018 81.0  80.0 - 100.0 fL Final  . MCH 12/27/2018 26.6  26.0 - 34.0 pg Final  . MCHC 12/27/2018 32.8  30.0 - 36.0 g/dL Final  . RDW 12/27/2018 13.4  11.5 - 15.5 % Final  . Platelet Count 12/27/2018 281  150 - 400 K/uL Final  . nRBC 12/27/2018 0.0  0.0 - 0.2 % Final  . Neutrophils Relative % 12/27/2018 59  % Final  . Neutro Abs 12/27/2018 4.4  1.7 - 7.7 K/uL Final  . Lymphocytes Relative 12/27/2018 18  % Final  . Lymphs Abs 12/27/2018 1.3  0.7 - 4.0 K/uL Final  . Monocytes Relative 12/27/2018 10  % Final  . Monocytes Absolute 12/27/2018 0.8  0.1 - 1.0 K/uL Final  . Eosinophils Relative 12/27/2018 11  % Final  . Eosinophils Absolute 12/27/2018 0.8* 0.0 - 0.5 K/uL Final  . Basophils Relative 12/27/2018 1  % Final  . Basophils Absolute  12/27/2018 0.1  0.0 - 0.1 K/uL Final  . Immature Granulocytes 12/27/2018 1  % Final  . Abs Immature Granulocytes 12/27/2018 0.05  0.00 - 0.07 K/uL Final   Performed at [facility], [place] [place], [place] [address]    (this displays the last labs from the last 3 days)  No results found for: TOTALPROTELP, ALBUMINELP, A1GS, A2GS, BETS, BETA2SER, GAMS, MSPIKE, SPEI (this displays SPEP labs)  No results found for: KPAFRELGTCHN, LAMBDASER, KAPLAMBRATIO (kappa/lambda light chains)  No results found for: HGBA, HGBA2QUANT, HGBFQUANT, HGBSQUAN (Hemoglobinopathy evaluation)   No results found for: LDH  Lab Results  Component Value Date   IRON 38 (L) 02/10/2012   TIBC 310 02/10/2012   IRONPCTSAT 12 (L) 02/10/2012   (Iron and TIBC)  Lab Results  Component Value Date   FERRITIN 27 02/09/2018    Urinalysis    Component Value Date/Time   COLORURINE YELLOW 01/12/2018 0840   APPEARANCEUR HAZY (A) 01/12/2018 0840   LABSPEC 1.028 01/12/2018 0840   PHURINE 5.0 01/12/2018 0840   GLUCOSEU NEGATIVE 01/12/2018 0840   HGBUR NEGATIVE 01/12/2018 0840   BILIRUBINUR NEGATIVE 01/12/2018 0840   KETONESUR NEGATIVE 01/12/2018 0840   PROTEINUR NEGATIVE 01/12/2018 0840   UROBILINOGEN 1.0 12/08/2014 1303   NITRITE NEGATIVE 01/12/2018 0840   LEUKOCYTESUR TRACE (A) 01/12/2018 0840     STUDIES: No results found.  ELIGIBLE FOR AVAILABLE RESEARCH PROTOCOL: no  ASSESSMENT: 57 y.o. [place], [place] woman status post right breast upper outer quadrant biopsy 06/04/2018 for a clinical T1c N0, stage IA invasive ductal carcinoma, grade 1, estrogen and progesterone receptor strongly positive, HER-2 not amplified, with an MIB-108%  (1) status post right lumpectomy and sentinel lymph node sampling 07/19/2018 for a pT1c pN1, stage IB invasive ductal carcinoma, grade 1, with negative margins  (2) Mammaprint obtained on the definitive surgical specimen showed "low risk",  predicting a risk of recurrence outside the breast within 5 years of 2 to 3% if the patient takes antiestrogens for 5 years.  No significant benefit from chemotherapy anticipated  (3) adjuvant radiation to be completed 10/18/2018  (4) to start anastrozole 11/28/2018  PLAN:  [name] did not tolerate initiating anastrozole well and it sounds like the diarrhea and nausea she experienced initially has since resolved.  I recommended that she continue to recover off of the Anastrozole.  We will get some lab work on her today to evaluate for any electrolyte imbalances, or other potential issues that may be going on.  She will return in 2-3 weeks for f/u with Dr. Jana Hakim to decide on what to do about her anti estrogen therapy.    She knows to call for any other issues that may develop before the next visit.  A total of (20) minutes of face-to-face time was spent with this patient with greater than 50% of that time in counseling and care-coordination.   Wilber Bihari, NP  12/27/18 2:25 PM Medical Oncology and Hematology [facility] [address] [place], [place] [address] Tel. [phone]    Fax. [phone]

## 2018-12-27 NOTE — Telephone Encounter (Signed)
-----   Message from Gardenia Phlegm, NP sent at 12/27/2018  2:28 PM EST ----- Patient potassium is slightly low.  Does she take any potassium supplementation? ----- Message ----- From: Interface, Lab In [facility] Sent: 12/27/2018  12:50 PM EST To: Gardenia Phlegm, NP

## 2019-01-07 ENCOUNTER — Other Ambulatory Visit: Payer: Self-pay | Admitting: Internal Medicine

## 2019-01-07 DIAGNOSIS — J441 Chronic obstructive pulmonary disease with (acute) exacerbation: Secondary | ICD-10-CM

## 2019-01-07 MED ORDER — FLUTICASONE-UMECLIDIN-VILANT 100-62.5-25 MCG/INH IN AEPB: 1.0000 | INHALATION_SPRAY | Freq: Every day | RESPIRATORY_TRACT | 11 refills | Status: DC

## 2019-01-07 NOTE — Telephone Encounter (Signed)
Pt wants refills go to [facility] instead of [facility] - she has Medicaid. Pleas re-send. Thanks

## 2019-01-07 NOTE — Addendum Note (Signed)
Addended by: Ebbie Latus on: 01/07/2019 03:01 PM   Modules accepted: Orders

## 2019-01-07 NOTE — Telephone Encounter (Signed)
Needs refill on Fluticasone-Umeclidin-Vilant (TRELEGY ELLIPTA) 100-62.5-25 MCG/INH AEPB at [place], [place]  ;pt contact [phone]  Pt currently has Medicaid all medicine needs to go to [place], [place]

## 2019-01-11 ENCOUNTER — Other Ambulatory Visit: Payer: Self-pay | Admitting: *Deleted

## 2019-01-11 DIAGNOSIS — E119 Type 2 diabetes mellitus without complications: Secondary | ICD-10-CM

## 2019-01-11 DIAGNOSIS — I5032 Chronic diastolic (congestive) heart failure: Secondary | ICD-10-CM

## 2019-01-11 DIAGNOSIS — J441 Chronic obstructive pulmonary disease with (acute) exacerbation: Secondary | ICD-10-CM

## 2019-01-11 DIAGNOSIS — I2510 Atherosclerotic heart disease of native coronary artery without angina pectoris: Secondary | ICD-10-CM

## 2019-01-11 DIAGNOSIS — F3342 Major depressive disorder, recurrent, in full remission: Secondary | ICD-10-CM

## 2019-01-11 DIAGNOSIS — I350 Nonrheumatic aortic (valve) stenosis: Secondary | ICD-10-CM

## 2019-01-11 DIAGNOSIS — E785 Hyperlipidemia, unspecified: Secondary | ICD-10-CM

## 2019-01-13 MED ORDER — NITROGLYCERIN 0.4 MG SL SUBL: 0.4000 mg | SUBLINGUAL_TABLET | SUBLINGUAL | 0 refills | Status: DC | PRN

## 2019-01-13 MED ORDER — METFORMIN HCL ER (MOD) 1000 MG PO TB24: 1000.0000 mg | ORAL_TABLET | Freq: Two times a day (BID) | ORAL | 3 refills | Status: DC

## 2019-01-13 MED ORDER — LOSARTAN POTASSIUM 100 MG PO TABS: 100.0000 mg | ORAL_TABLET | Freq: Every day | ORAL | 1 refills | Status: DC

## 2019-01-13 MED ORDER — METOPROLOL TARTRATE 25 MG PO TABS: 25.0000 mg | ORAL_TABLET | Freq: Two times a day (BID) | ORAL | 1 refills | Status: DC

## 2019-01-13 MED ORDER — POTASSIUM CHLORIDE CRYS ER 20 MEQ PO TBCR: 40.0000 meq | EXTENDED_RELEASE_TABLET | Freq: Every day | ORAL | 1 refills | Status: DC

## 2019-01-13 MED ORDER — ALBUTEROL SULFATE (2.5 MG/3ML) 0.083% IN NEBU: 2.5000 mg | INHALATION_SOLUTION | RESPIRATORY_TRACT | 1 refills | Status: DC | PRN

## 2019-01-13 MED ORDER — LIRAGLUTIDE 18 MG/3ML ~~LOC~~ SOPN: 1.8000 mg | PEN_INJECTOR | Freq: Every day | SUBCUTANEOUS | 5 refills | Status: DC

## 2019-01-13 MED ORDER — CLOPIDOGREL BISULFATE 75 MG PO TABS: 75.0000 mg | ORAL_TABLET | Freq: Every morning | ORAL | 1 refills | Status: DC

## 2019-01-13 MED ORDER — DULOXETINE HCL 30 MG PO CPEP: 30.0000 mg | ORAL_CAPSULE | Freq: Two times a day (BID) | ORAL | 1 refills | Status: DC

## 2019-01-13 MED ORDER — HYDROCHLOROTHIAZIDE 25 MG PO TABS: 25.0000 mg | ORAL_TABLET | Freq: Every day | ORAL | 1 refills | Status: DC

## 2019-01-13 MED ORDER — ALBUTEROL SULFATE HFA 108 (90 BASE) MCG/ACT IN AERS: INHALATION_SPRAY | RESPIRATORY_TRACT | 2 refills | Status: DC

## 2019-01-13 MED ORDER — EZETIMIBE 10 MG PO TABS: 10.0000 mg | ORAL_TABLET | Freq: Every day | ORAL | 1 refills | Status: DC

## 2019-01-13 MED ORDER — ATORVASTATIN CALCIUM 80 MG PO TABS: 80.0000 mg | ORAL_TABLET | Freq: Every day | ORAL | 1 refills | Status: DC

## 2019-01-13 MED ORDER — FLUTICASONE-UMECLIDIN-VILANT 100-62.5-25 MCG/INH IN AEPB: 1.0000 | INHALATION_SPRAY | Freq: Every day | RESPIRATORY_TRACT | 1 refills | Status: DC

## 2019-01-13 MED ORDER — FUROSEMIDE 40 MG PO TABS: 40.0000 mg | ORAL_TABLET | Freq: Two times a day (BID) | ORAL | 1 refills | Status: DC

## 2019-01-17 ENCOUNTER — Other Ambulatory Visit: Payer: Self-pay | Admitting: *Deleted

## 2019-01-17 DIAGNOSIS — C50411 Malignant neoplasm of upper-outer quadrant of right female breast: Secondary | ICD-10-CM

## 2019-01-17 DIAGNOSIS — Z170 Estrogen receptor positive status [ER+]: Principal | ICD-10-CM

## 2019-01-18 NOTE — Progress Notes (Signed)
[name]  [phone] [phone]   ID: [name] DOB: 1961/07/05  MR#: [id number]  HUT#:[id number]  Patient Care Team: Lorella Nimrod, MD as PCP - General (Internal Medicine) Burnell Blanks, MD as PCP - Cardiology (Cardiology) Alphonsa Overall, MD as Consulting Physician (General Surgery) Magrinat, Virgie Dad, MD as Consulting Physician (Oncology) Kyung Rudd, MD as Consulting Physician (Radiation Oncology) Burnell Blanks, MD as Consulting Physician (Cardiology) Delice Bison, Charlestine Massed, NP as Nurse Practitioner (Hematology and Oncology) OTHER MD:   CHIEF COMPLAINT: Estrogen receptor positive breast cancer  CURRENT TREATMENT: Exemestane   HISTORY OF CURRENT ILLNESS: On the original intake note:  [name] had routine screening mammography on 05/27/2018 showing a possible abnormality in the right breast. She underwent unilateral right  diagnostic mammography with tomography and right breast ultrasonography at [facility] on 06/02/2018 showing: breast density category B. An area of distortion is identified in the upper outer quadrant of the right breast mammographically. Ultrasonography of this area found an irregular hypoechoic mass at the 11 o'clock upper outer quadrant measuring 1.8 x 1.8 x 1.4 cm and located 6 cm from the nipple. The right axilla is negative sonographically for abnormal lymph nodes.   Accordingly on 06/04/2018 she proceeded to biopsy of the right breast area in question. The pathology from this procedure showed ([id number]): Invasive ductal carcinoma grade 1 measuring 1.0 cm in greatest extent, with microcalcifications. Ductal carcinoma in situ high grade. Prognostic indicators significant for: estrogen receptor, 100% positive and progesterone receptor, 100% positive, both with strong staining intensity. Proliferation marker Ki67 at 8%. HER2  not amplified with ratios HER2/CEP17 signals 1.32 and  average HER2 copies per cell 1.85  The patient's subsequent history is as detailed below.   INTERVAL HISTORY: [name] returns today for follow-up and treatment of her estrogen receptor positive breast cancer.   She discontinued anastrozole on 12/27/2018 due to poor tolerance. She developed diarrhea, nausea, vomiting, listlessness and fatigue. She also had extreme hot flashes.  She began to improve after a week and a half. All issues except diarrhea have resolved. She is here today to discuss alternatives.  Her last screening mammography was on 05/27/2018 at [place].  Since her last visit here, she completed adjuvant radiation, which lasted from 09/01/2018 to 10/19/2018 Site/dose: The patient initially received a dose of 50.4 Gy in 28 fractions to the right breast using whole-breast tangent fields. This was delivered using a 3-D conformal technique. The patient then received a boost to the seroma. This delivered an additional 10 Gy in 5 fractions using 15x photons with a Complex Isodose technique. The total dose was 60.4 Gy.    REVIEW OF SYSTEMS: Magdalena states that she has occasional loose diarrhea. She also says that her urine is dark, orange in color, and strong smelling. She is drinking a lot of seltzer water, usually about three or four per day, to try and replace her soda habits. She had her teeth pulled before the holidays and received dentures in their place. The patient denies unusual headaches, visual changes, or dizziness. There has been no unusual cough, phlegm production, or pleurisy. The patient denies unexplained weight loss, bleeding, or fever. A detailed review of systems was otherwise noncontributory.    PAST MEDICAL HISTORY: Past Medical History:  Diagnosis Date  . Anemia   . Aortic valve stenosis, severe   . Arthritis    PAIN AND SEVERE OA LEFT KNEE ; S/P RIGHT TKA ON 02/03/12; HAS LOWER  BACK PAIN-UNABLE TO STAND MORE THAN 10 MIN; ARTHRITIS "ALL OVER"  .  Asthma   . Blood transfusion    [facility]  . Breast cancer in female [facility])    Right  . CAD (coronary artery disease)    Cath 2010 with DES x 1 RCA-- PT'S CARDIOLOGIST IS DR. Kulpmont  . Chronic diastolic congestive heart failure ([place])   . COPD (chronic obstructive pulmonary disease) ([place])   . Depression   . Diabetes mellitus DIAGNOSED IN2010   Controll s with diet  . Eczema    on back  . Hyperlipidemia   . Hypertension   . Kidney stones   . Morbid obesity with body mass index of 60.0-69.9 in adult [facility])   . Myocardial infarction ([place])    PT THINKS SHE WAS DX WITH MI AT THE TIME OF HEART STENTING  . Pulmonary embolism ([place]) 02/08/12   S/P RT TOTAL KNEE ON 02/03/12--ON 02/08/12--DEVELOPED ACUTE SOB AND CHEST PAIN--AND DIAGNOSED WITH  PULMONARY EMBOLUS AND PNEUMONIA  . Restless leg syndrome   . Sleep apnea    uses 3 liters O2 at night   . Uterine fibroid    NO PROBLEMS AT PRESENT FROM THE FIBROIDS-STATES SHE IS POST MENOPAUSAL-LAST MENSES 2010 EXCEPT FOR EPISODE THIS YR OF BLEEDING RELATED TO FIBROIDS.  Marland Kitchen Weakness    BOTH HANDS - S/P BILATERAL CARPAL TUNNEL RELEASE--BUT STILL HAS WEAKNESS--OFTEN DROPS THINGS  Migraines at younger age. MI in 2009  PAST SURGICAL HISTORY: Past Surgical History:  Procedure Laterality Date  . BREAST LUMPECTOMY WITH RADIOACTIVE SEED AND SENTINEL LYMPH NODE BIOPSY Right 07/19/2018   Procedure: BREAST LUMPECTOMY WITH RADIOACTIVE SEED AND SENTINEL LYMPH NODE BIOPSY;  Surgeon: Alphonsa Overall, MD;  Location: [place];  Service: General;  Laterality: Right;  . CARDIAC CATHETERIZATION    . CARPAL TUNNEL RELEASE     Bilateral  . CHOLECYSTECTOMY    . CORONARY ANGIOPLASTY     2010 has stent in place  . CYSTOSCOPY W/ RETROGRADES Right 09/21/2013   Procedure: CYSTOSCOPY WITH RIGHT RETROGRADE PYELOGRAM RIGHT DOUBLE J STENT ;  Surgeon: Fredricka Bonine, MD;  Location[facility];  Service: Urology;  Laterality: Right;  . CYSTOSCOPY WITH URETEROSCOPY AND STENT PLACEMENT  Right 10/25/2013   Procedure: CYSTOSCOPY RIGHT URETEROSCOPY HOLMIUM LASER LITHO AND STENT PLACEMENT;  Surgeon: Fredricka Bonine, MD;  Location: [facility];  Service: Urology;  Laterality: Right;  . HERNIA REPAIR    . INTRAOPERATIVE TRANSESOPHAGEAL ECHOCARDIOGRAM N/A 12/12/2014   Procedure: INTRAOPERATIVE TRANSESOPHAGEAL ECHOCARDIOGRAM;  Surgeon: Burnell Blanks, MD;  Location[facility] OR;  Service: Cardiovascular;  Laterality: N/A;  . JOINT REPLACEMENT     bil total knees  . KNEE ARTHROPLASTY  02/03/2012   Procedure: COMPUTER ASSISTED TOTAL KNEE ARTHROPLASTY;  Surgeon: Mcarthur Rossetti, MD;  Location: [place];  Service: Orthopedics;  Laterality: Right;  Right total knee arthroplasty  . LEFT AND RIGHT HEART CATHETERIZATION WITH CORONARY ANGIOGRAM N/A 03/17/2013   Procedure: LEFT AND RIGHT HEART CATHETERIZATION WITH CORONARY ANGIOGRAM;  Surgeon: Burnell Blanks, MD;  Location[facility];  Service: Cardiovascular;  Laterality: N/A;  . LEFT AND RIGHT HEART CATHETERIZATION WITH CORONARY/GRAFT ANGIOGRAM N/A 09/14/2014   Procedure: LEFT AND RIGHT HEART CATHETERIZATION WITH Beatrix Fetters;  Surgeon: Burnell Blanks, MD;  Location: [facility] CATH LAB;  Service: Cardiovascular;  Laterality: N/A;  . TEE WITHOUT CARDIOVERSION N/A 03/14/2013   Procedure: TRANSESOPHAGEAL ECHOCARDIOGRAM (TEE);  Surgeon: Lelon Perla, MD;  Location: [place];  Service: Cardiovascular;  Laterality: N/A;  . TEE WITHOUT CARDIOVERSION N/A 11/14/2014   Procedure: TRANSESOPHAGEAL ECHOCARDIOGRAM (TEE);  Surgeon: Lelon Perla, MD;  Location: [facility] ENDOSCOPY;  Service: Cardiovascular;  Laterality: N/A;  . TOTAL KNEE ARTHROPLASTY  09/10/2012   Procedure: TOTAL KNEE ARTHROPLASTY;  Surgeon: Mcarthur Rossetti, MD;  [facility];  Service: Orthopedics;  Laterality: Left;  . TOTAL KNEE REVISION Right 07/15/2013   Procedure: REVISION ARTHROPLASTY RIGHT KNEE;  Surgeon: Mcarthur Rossetti, MD;  Location:  [facility];  Service: Orthopedics;  Laterality: Right;  . TRANSCATHETER AORTIC VALVE REPLACEMENT, TRANSFEMORAL N/A 12/12/2014   Procedure: TRANSCATHETER AORTIC VALVE REPLACEMENT, TRANSFEMORAL;  Surgeon: Burnell Blanks, MD;  Location: [place];  Service: Cardiovascular;  Laterality: N/A;  . TRIGGER FINGER RELEASE  09/10/2012   Procedure: RELEASE TRIGGER FINGER/A-1 PULLEY;  Surgeon: Mcarthur Rossetti, MD;  Location[facility];  Service: Orthopedics;  Laterality: Right;  Right Ring Finger  . TUBAL LIGATION    [place]ov[place] Mitral Aortic valve surgery 2015   FAMILY HISTORY Family History  Problem Relation Age of Onset  . Breast cancer Mother        stage IV at diagnosis  . Emphysema Mother        smoked  . Heart disease Mother   . COPD Father        smoked  . Asthma Father   . Heart disease Father   . Cancer Brother        Sinus  The patient's father died at age 37 due to emphysema and possibly cancer . The patient's mother died at age 19 due to breast cancer with stage IV disease at presentation. The patient has 2 full and 1 half brothers and 1 sister.  One of the patient's brothers had nasopharyngeal cancer stage IV diagnosed at age 29. There was also a paternal great grandfather with prostate cancer. The patient denies a family history of ovarian cancer.    GYNECOLOGIC HISTORY:  Patient's last menstrual period was 02/03/2012. Menarche: 58 years old Age at first live birth: 58 years old She is Valentine P2. Her LMP was around age 25. She used oral contraception in her 23'Z with no complications.    SOCIAL HISTORY:  Miriam is disabled. Her husband, Arnette Norris is retired from working in Health and safety inspector from [employer], Estate agent, and smoke damage. The patient's son, Christia Reading 69 lives in [place] and works as a Biomedical scientist. The patient's son Legrand Como 30 lives in [place] and is a Environmental education officer of a [employer]. The patient has no grandchildren. She attends [employer].     ADVANCED  DIRECTIVES: not in place   HEALTH MAINTENANCE: Social History   Tobacco Use  . Smoking status: Former Smoker    Packs/day: 1.50    Years: 30.00    Pack years: 45.00    Types: Cigarettes    Last attempt to quit: 12/29/2000    Years since quitting: 18.0  . Smokeless tobacco: Never Used  Substance Use Topics  . Alcohol use: Yes    Alcohol/week: 1.0 standard drinks    Types: 1 Cans of beer per week    Frequency: Never    Comment: rarely  . Drug use: No     Colonoscopy: about 8 year ago  PAP: 10/20/2016 normal   Bone density: no   No Known Allergies  Current Outpatient Medications  Medication Sig Dispense Refill  . albuterol (PROVENTIL HFA) 108 (90 Base) MCG/ACT inhaler INHALE 2 PUFFS BY MOUTH EVERY 4 HOURS AS  NEEDED FOR COUGHING, WHEEZING, OR SHORTNESS OF BREATH 13.4 g 2  . albuterol (PROVENTIL) (2.5 MG/3ML) 0.083% nebulizer solution Take 3 mLs (2.5 mg total) by nebulization every 4 (four) hours as needed for wheezing or shortness of breath. 180 mL 1  . Alirocumab (PRALUENT) 150 MG/ML SOPN Inject 150 mg into the skin every 14 (fourteen) days. 2 pen 11  . ASPIRIN ADULT LOW STRENGTH 81 MG EC tablet TAKE 1 Tablet BY MOUTH ONCE DAILY 90 tablet 3  . atorvastatin (LIPITOR) 80 MG tablet Take 1 tablet (80 mg total) by mouth daily. 90 tablet 1  . Blood Glucose Monitoring Suppl (ACCU-CHEK AVIVA CONNECT) w/Device KIT 1 kit by Does not apply route 2 (two) times daily at 8 am and 10 pm. 1 kit 0  . clopidogrel (PLAVIX) 75 MG tablet Take 1 tablet (75 mg total) by mouth every morning. 90 tablet 1  . DULoxetine (CYMBALTA) 30 MG capsule Take 1 capsule (30 mg total) by mouth 2 (two) times daily. 180 capsule 1  . exemestane (AROMASIN) 25 MG tablet Take 1 tablet (25 mg total) by mouth daily after breakfast. 90 tablet 12  . ezetimibe (ZETIA) 10 MG tablet Take 1 tablet (10 mg total) by mouth daily. 90 tablet 1  . Fluticasone-Umeclidin-Vilant (TRELEGY ELLIPTA) 100-62.5-25 MCG/INH AEPB Inhale 1 puff into  the lungs daily. 3 each 1  . furosemide (LASIX) 40 MG tablet Take 1 tablet (40 mg total) by mouth 2 (two) times daily. 180 tablet 1  . gabapentin (NEURONTIN) 300 MG capsule Take 3 capsules (900 mg total) by mouth at bedtime. 90 capsule 1  . glucose blood (ACCU-CHEK AVIVA PLUS) test strip Use to test blood sugar 2 times daily. diag code E11.9. Non insulin dependent, A1C greater than 9. 300 each 1  . hydrochlorothiazide (HYDRODIURIL) 25 MG tablet Take 1 tablet (25 mg total) by mouth daily. 90 tablet 1  . ibuprofen (ADVIL,MOTRIN) 200 MG tablet Take 400 mg by mouth every 6 (six) hours as needed for moderate pain.    . Lancets (ACCU-CHEK SOFT TOUCH) lancets Use as instructed 100 each 12  . liraglutide (VICTOZA) 18 MG/3ML SOPN Inject 0.1 mLs (0.6 mg total) into the skin daily for 7 days, THEN 0.2 mLs (1.2 mg total) daily. 6.7 mL 5  . liraglutide (VICTOZA) 18 MG/3ML SOPN Inject 0.3 mLs (1.8 mg total) into the skin daily. 5 pen 5  . losartan (COZAAR) 100 MG tablet Take 1 tablet (100 mg total) by mouth daily. 90 tablet 1  . meloxicam (MOBIC) 15 MG tablet TAKE 1 TABLET BY MOUTH EVERY DAY FOR 7 DAYS - STOP CLOPIDOGREL FOR THOSE 7 DAYS - THEN TAKE 1 TABLET DAILY ONLY IF NEEDED AS DIRECTED (Patient taking differently: Take 15 mg by mouth daily as needed for pain) 30 tablet 0  . metFORMIN (GLUMETZA) 1000 MG (MOD) 24 hr tablet Take 1 tablet (1,000 mg total) by mouth 2 (two) times daily. 180 tablet 3  . metoprolol tartrate (LOPRESSOR) 25 MG tablet Take 1 tablet (25 mg total) by mouth 2 (two) times daily. 180 tablet 1  . nitroGLYCERIN (NITROSTAT) 0.4 MG SL tablet Place 1 tablet (0.4 mg total) under the tongue every 5 (five) minutes as needed for chest pain. 100 tablet 0  . OXYGEN Inhale 3 L into the lungs daily.    . potassium chloride SA (K-DUR,KLOR-CON) 20 MEQ tablet Take 2 tablets (40 mEq total) by mouth daily. 180 tablet 1   No current facility-administered medications for this visit.  OBJECTIVE: Morbidly  obese white woman in no acute distress  Vitals:   01/19/19 1144  BP: (!) 146/91  Pulse: (!) 113  Resp: 18  Temp: 98.1 F (36.7 C)  SpO2: 93%     Body mass index is 60.17 kg/m.   Wt Readings from Last 3 Encounters:  01/19/19 (!) 308 lb 1.6 oz (139.8 kg)  12/27/18 (!) 310 lb 3.2 oz (140.7 kg)  12/06/18 (!) 320 lb (145.2 kg)      ECOG FS:2 - Symptomatic, <50% confined to bed  Sclerae unicteric, EOMs intact Oropharynx edentulous No cervical or supraclavicular adenopathy Lungs no rales or rhonchi Heart regular rate and rhythm Abd soft, nontender, positive bowel sounds MSK no focal spinal tenderness, no upper extremity lymphedema Neuro: nonfocal, well oriented, appropriate affect Breasts: The right breast is status post lumpectomy and radiation.  There is no suspicious mass.  There is no skin or nipple change of concern.  Left breast is unremarkable.  Both axillae are benign Skin: Small erosion in the right axilla likely secondary to bra rub     LAB RESULTS:  CMP     Component Value Date/Time   NA 136 12/27/2018 1237   K 3.2 (L) 12/27/2018 1237   CL 93 (L) 12/27/2018 1237   CO2 30 12/27/2018 1237   GLUCOSE 146 (H) 12/27/2018 1237   BUN 10 12/27/2018 1237   CREATININE 0.82 12/27/2018 1237   CREATININE 0.87 07/09/2012 1603   CALCIUM 9.7 12/27/2018 1237   PROT 7.4 12/27/2018 1237   PROT 7.0 08/06/2018 1323   ALBUMIN 3.4 (L) 12/27/2018 1237   ALBUMIN 4.2 08/06/2018 1323   AST 16 12/27/2018 1237   ALT 13 12/27/2018 1237   ALKPHOS 69 12/27/2018 1237   BILITOT 1.4 (H) 12/27/2018 1237   GFRNONAA >60 12/27/2018 1237   GFRAA >60 12/27/2018 1237    No results found for: TOTALPROTELP, ALBUMINELP, A1GS, A2GS, BETS, BETA2SER, GAMS, MSPIKE, SPEI  No results found for: KPAFRELGTCHN, LAMBDASER, KAPLAMBRATIO  Lab Results  Component Value Date   WBC 7.6 01/19/2019   NEUTROABS 4.7 01/19/2019   HGB 12.4 01/19/2019   HCT 39.1 01/19/2019   MCV 82.0 01/19/2019   PLT 258  01/19/2019    _0 @  No results found for: LABCA2  No components found for: PFXTKW409  No results for input(s): INR in the last 168 hours.  No results found for: LABCA2  No results found for: BDZ329  No results found for: JME268  No results found for: TMH962  No results found for: CA2729  No components found for: HGQUANT  No results found for: CEA1 / No results found for: CEA1   No results found for: AFPTUMOR  No results found for: CHROMOGRNA  No results found for: PSA1  Appointment on 01/19/2019  Component Date Value Ref Range Status  . WBC Count 01/19/2019 7.6  4.0 - 10.5 K/uL Final  . RBC 01/19/2019 4.77  3.87 - 5.11 MIL/uL Final  . Hemoglobin 01/19/2019 12.4  12.0 - 15.0 g/dL Final  . HCT 01/19/2019 39.1  36.0 - 46.0 % Final  . MCV 01/19/2019 82.0  80.0 - 100.0 fL Final  . MCH 01/19/2019 26.0  26.0 - 34.0 pg Final  . MCHC 01/19/2019 31.7  30.0 - 36.0 g/dL Final  . RDW 01/19/2019 13.9  11.5 - 15.5 % Final  . Platelet Count 01/19/2019 258  150 - 400 K/uL Final  . nRBC 01/19/2019 0.0  0.0 - 0.2 % Final  . Neutrophils Relative %  01/19/2019 60  % Final  . Neutro Abs 01/19/2019 4.7  1.7 - 7.7 K/uL Final  . Lymphocytes Relative 01/19/2019 26  % Final  . Lymphs Abs 01/19/2019 2.0  0.7 - 4.0 K/uL Final  . Monocytes Relative 01/19/2019 7  % Final  . Monocytes Absolute 01/19/2019 0.5  0.1 - 1.0 K/uL Final  . Eosinophils Relative 01/19/2019 5  % Final  . Eosinophils Absolute 01/19/2019 0.4  0.0 - 0.5 K/uL Final  . Basophils Relative 01/19/2019 1  % Final  . Basophils Absolute 01/19/2019 0.1  0.0 - 0.1 K/uL Final  . Immature Granulocytes 01/19/2019 1  % Final  . Abs Immature Granulocytes 01/19/2019 0.04  0.00 - 0.07 K/uL Final   Performed at [facility], [place] [address] [place], [place] [address]    (this displays the last labs from the last 3 days)  No results found for: TOTALPROTELP, ALBUMINELP, A1GS, A2GS, BETS, BETA2SER, GAMS,  MSPIKE, SPEI (this displays SPEP labs)  No results found for: KPAFRELGTCHN, LAMBDASER, KAPLAMBRATIO (kappa/lambda light chains)  No results found for: HGBA, HGBA2QUANT, HGBFQUANT, HGBSQUAN (Hemoglobinopathy evaluation)   No results found for: LDH  Lab Results  Component Value Date   IRON 38 (L) 02/10/2012   TIBC 310 02/10/2012   IRONPCTSAT 12 (L) 02/10/2012   (Iron and TIBC)  Lab Results  Component Value Date   FERRITIN 27 02/09/2018    Urinalysis    Component Value Date/Time   COLORURINE YELLOW 01/12/2018 0840   APPEARANCEUR HAZY (A) 01/12/2018 0840   LABSPEC 1.028 01/12/2018 0840   PHURINE 5.0 01/12/2018 0840   GLUCOSEU NEGATIVE 01/12/2018 0840   HGBUR NEGATIVE 01/12/2018 0840   BILIRUBINUR NEGATIVE 01/12/2018 0840   KETONESUR NEGATIVE 01/12/2018 0840   PROTEINUR NEGATIVE 01/12/2018 0840   UROBILINOGEN 1.0 12/08/2014 1303   NITRITE NEGATIVE 01/12/2018 0840   LEUKOCYTESUR TRACE (A) 01/12/2018 0840     STUDIES: Next mammogram due mid June 20  ELIGIBLE FOR AVAILABLE RESEARCH PROTOCOL: no   ASSESSMENT: 58 y.o. [place], [place] woman status post right breast upper outer quadrant biopsy 06/04/2018 for a clinical T1c N0, stage IA invasive ductal carcinoma, grade 1, estrogen and progesterone receptor strongly positive, HER-2 not amplified, with an MIB-108%  (1) status post right lumpectomy and sentinel lymph node sampling 07/19/2018 for a pT1c pN1, stage IB invasive ductal carcinoma, grade 1, with negative margins  (2) Mammaprint obtained on the definitive surgical specimen showed "low risk", predicting a risk of recurrence outside the breast within 5 years of 2 to 3% if the patient takes antiestrogens for 5 years.  No significant benefit from chemotherapy anticipated  (3) adjuvant radiation 09/01/2018 - 10/19/2018  Site/dose: The patient initially received a dose of 50.4 Gy in 28 fractions to the right breast using whole-breast tangent fields. This was delivered using  a 3-D conformal technique. The patient then received a boost to the seroma. This delivered an additional 10 Gy in 5 fractions using 15x photons with a Complex Isodose technique. The total dose was 60.4 Gy.  (4) started anastrozole 11/28/2018, discontinued after 4 weeks with diarrhea and other side effects   PLAN: I discussed the antiestrogen options with [name].  She understands that on the tamoxifen side I am concerned because of her history of pulmonary embolism.  On the other hand we could consider Faslodex, which is not associated with an increased clotting risk.  A simpler alternative however may be to go to the aromatase inhibitors on the steroidal side,  namely exemestane.  This may or may not have similar side effects to anastrozole.  It certainly is equally effective.  After much discussion we decided we would give exemestane a try and I have placed that prescription in for her.  She will call if there is a cost issue or if there are unusual side effects.  If all goes well she will see me again in July after her June mammography  She knows to call for any other issues that may develop before that visit.  Tacie Mccuistion, Virgie Dad, MD  01/19/19 12:02 PM Medical Oncology and Hematology [facility] [address] [place], [place] [address] Tel. [phone]    Fax. [phone]  I, Jacqualyn Posey am acting as a Education administrator for Chauncey Cruel, MD.   I, Lurline Del MD, have reviewed the above documentation for accuracy and completeness, and I agree with the above.

## 2019-01-19 ENCOUNTER — Inpatient Hospital Stay (HOSPITAL_BASED_OUTPATIENT_CLINIC_OR_DEPARTMENT_OTHER): Payer: Medicaid Other | Admitting: Oncology

## 2019-01-19 ENCOUNTER — Telehealth: Payer: Self-pay | Admitting: Oncology

## 2019-01-19 ENCOUNTER — Telehealth: Payer: Self-pay

## 2019-01-19 ENCOUNTER — Inpatient Hospital Stay: Payer: Medicaid Other | Attending: Oncology

## 2019-01-19 VITALS — BP 146/91 | HR 113 | Temp 98.1°F | Resp 18 | Ht 60.0 in | Wt 308.1 lb

## 2019-01-19 DIAGNOSIS — G4730 Sleep apnea, unspecified: Secondary | ICD-10-CM

## 2019-01-19 DIAGNOSIS — I5032 Chronic diastolic (congestive) heart failure: Secondary | ICD-10-CM | POA: Diagnosis not present

## 2019-01-19 DIAGNOSIS — G2581 Restless legs syndrome: Secondary | ICD-10-CM | POA: Insufficient documentation

## 2019-01-19 DIAGNOSIS — E785 Hyperlipidemia, unspecified: Secondary | ICD-10-CM | POA: Diagnosis not present

## 2019-01-19 DIAGNOSIS — C50411 Malignant neoplasm of upper-outer quadrant of right female breast: Secondary | ICD-10-CM | POA: Insufficient documentation

## 2019-01-19 DIAGNOSIS — Z170 Estrogen receptor positive status [ER+]: Secondary | ICD-10-CM | POA: Insufficient documentation

## 2019-01-19 DIAGNOSIS — I252 Old myocardial infarction: Secondary | ICD-10-CM | POA: Insufficient documentation

## 2019-01-19 DIAGNOSIS — Z87891 Personal history of nicotine dependence: Secondary | ICD-10-CM | POA: Insufficient documentation

## 2019-01-19 DIAGNOSIS — Z6841 Body Mass Index (BMI) 40.0 and over, adult: Secondary | ICD-10-CM

## 2019-01-19 DIAGNOSIS — I110 Hypertensive heart disease with heart failure: Secondary | ICD-10-CM | POA: Diagnosis not present

## 2019-01-19 DIAGNOSIS — R112 Nausea with vomiting, unspecified: Secondary | ICD-10-CM

## 2019-01-19 DIAGNOSIS — D649 Anemia, unspecified: Secondary | ICD-10-CM

## 2019-01-19 DIAGNOSIS — Z79899 Other long term (current) drug therapy: Secondary | ICD-10-CM | POA: Insufficient documentation

## 2019-01-19 DIAGNOSIS — E119 Type 2 diabetes mellitus without complications: Secondary | ICD-10-CM | POA: Diagnosis not present

## 2019-01-19 DIAGNOSIS — R197 Diarrhea, unspecified: Secondary | ICD-10-CM | POA: Insufficient documentation

## 2019-01-19 DIAGNOSIS — R5383 Other fatigue: Secondary | ICD-10-CM

## 2019-01-19 DIAGNOSIS — I2510 Atherosclerotic heart disease of native coronary artery without angina pectoris: Secondary | ICD-10-CM | POA: Diagnosis not present

## 2019-01-19 DIAGNOSIS — Z87442 Personal history of urinary calculi: Secondary | ICD-10-CM

## 2019-01-19 DIAGNOSIS — R531 Weakness: Secondary | ICD-10-CM | POA: Insufficient documentation

## 2019-01-19 DIAGNOSIS — F329 Major depressive disorder, single episode, unspecified: Secondary | ICD-10-CM | POA: Insufficient documentation

## 2019-01-19 DIAGNOSIS — I350 Nonrheumatic aortic (valve) stenosis: Secondary | ICD-10-CM | POA: Insufficient documentation

## 2019-01-19 DIAGNOSIS — J449 Chronic obstructive pulmonary disease, unspecified: Secondary | ICD-10-CM

## 2019-01-19 DIAGNOSIS — Z7984 Long term (current) use of oral hypoglycemic drugs: Secondary | ICD-10-CM

## 2019-01-19 DIAGNOSIS — R232 Flushing: Secondary | ICD-10-CM | POA: Insufficient documentation

## 2019-01-19 LAB — CMP (CANCER CENTER ONLY)
ALT: 17 U/L (ref 0–44)
AST: 18 U/L (ref 15–41)
Albumin: 3.5 g/dL (ref 3.5–5.0)
Alkaline Phosphatase: 66 U/L (ref 38–126)
Anion gap: 15 (ref 5–15)
BUN: 14 mg/dL (ref 6–20)
CO2: 24 mmol/L (ref 22–32)
CREATININE: 0.84 mg/dL (ref 0.44–1.00)
Calcium: 9.9 mg/dL (ref 8.9–10.3)
Chloride: 98 mmol/L (ref 98–111)
GFR, Estimated: >60 mL/min (ref >60)
Glucose, Bld: 149 mg/dL — ABNORMAL HIGH (ref 70–99)
Potassium: 3.9 mmol/L (ref 3.5–5.1)
Sodium: 137 mmol/L (ref 135–145)
Total Bilirubin: 1.1 mg/dL (ref 0.3–1.2)
Total Protein: 7.5 g/dL (ref 6.5–8.1)

## 2019-01-19 LAB — CBC WITH DIFFERENTIAL (CANCER CENTER ONLY)
Abs Immature Granulocytes: 0.04 10*3/uL (ref 0.00–0.07)
BASOS ABS: 0.1 10*3/uL (ref 0.0–0.1)
Basophils Relative: 1 %
Eosinophils Absolute: 0.4 10*3/uL (ref 0.0–0.5)
Eosinophils Relative: 5 %
HCT: 39.1 % (ref 36.0–46.0)
Hemoglobin: 12.4 g/dL (ref 12.0–15.0)
Immature Granulocytes: 1 %
LYMPHS ABS: 2 10*3/uL (ref 0.7–4.0)
Lymphocytes Relative: 26 %
MCH: 26 pg (ref 26.0–34.0)
MCHC: 31.7 g/dL (ref 30.0–36.0)
MCV: 82 fL (ref 80.0–100.0)
Monocytes Absolute: 0.5 10*3/uL (ref 0.1–1.0)
Monocytes Relative: 7 %
Neutro Abs: 4.7 10*3/uL (ref 1.7–7.7)
Neutrophils Relative %: 60 %
Platelet Count: 258 10*3/uL (ref 150–400)
RBC: 4.77 MIL/uL (ref 3.87–5.11)
RDW: 13.9 % (ref 11.5–15.5)
WBC Count: 7.6 10*3/uL (ref 4.0–10.5)
nRBC: 0 % (ref 0.0–0.2)

## 2019-01-19 MED ORDER — EXEMESTANE 25 MG PO TABS: 25.0000 mg | ORAL_TABLET | Freq: Every day | ORAL | 12 refills | Status: DC

## 2019-01-19 NOTE — Telephone Encounter (Signed)
Yes we can make it Metformin 500 ER with 2 tablets twice daily.

## 2019-01-19 NOTE — Telephone Encounter (Signed)
Gave avs and calendar ° °

## 2019-01-19 NOTE — Telephone Encounter (Signed)
Received fax from [place] regarding Metformin 1000 24hr RX which was sent on 01/13/19.  Message states:  "Metformin 1000mg  ER not covered.  Do you want Metformin 500 ER 500mg  #2 BID?"  Will forward to pcp. Thank you, SChaplin, RN,BSN

## 2019-01-21 ENCOUNTER — Other Ambulatory Visit: Payer: Self-pay | Admitting: Internal Medicine

## 2019-01-21 MED ORDER — METFORMIN HCL ER 500 MG PO TB24: 1000.0000 mg | ORAL_TABLET | Freq: Two times a day (BID) | ORAL | 6 refills | Status: DC

## 2019-01-21 NOTE — Telephone Encounter (Signed)
Done

## 2019-01-21 NOTE — Telephone Encounter (Signed)
Trelegy Ellipta rx called to [place].

## 2019-01-31 ENCOUNTER — Other Ambulatory Visit: Payer: Medicaid Other | Admitting: *Deleted

## 2019-01-31 DIAGNOSIS — E785 Hyperlipidemia, unspecified: Secondary | ICD-10-CM

## 2019-01-31 LAB — LIPID PANEL
Chol/HDL Ratio: 1.9 ratio (ref 0.0–4.4)
Cholesterol, Total: 97 mg/dL — ABNORMAL LOW (ref 100–199)
HDL: 50 mg/dL (ref >39)
LDL Calculated: 26 mg/dL (ref 0–99)
Triglycerides: 104 mg/dL (ref 0–149)
VLDL Cholesterol Cal: 21 mg/dL (ref 5–40)

## 2019-01-31 LAB — HEPATIC FUNCTION PANEL
ALT: 15 IU/L (ref 0–32)
AST: 16 IU/L (ref 0–40)
Albumin: 3.9 g/dL (ref 3.8–4.9)
Alkaline Phosphatase: 57 IU/L (ref 39–117)
BILIRUBIN, DIRECT: 0.25 mg/dL (ref 0.00–0.40)
Bilirubin Total: 0.7 mg/dL (ref 0.0–1.2)
Total Protein: 6.2 g/dL (ref 6.0–8.5)

## 2019-02-10 ENCOUNTER — Other Ambulatory Visit: Payer: Medicaid Other

## 2019-02-10 ENCOUNTER — Ambulatory Visit: Payer: Medicaid Other | Admitting: Oncology

## 2019-03-07 ENCOUNTER — Other Ambulatory Visit: Payer: Self-pay | Admitting: Internal Medicine

## 2019-03-07 DIAGNOSIS — E119 Type 2 diabetes mellitus without complications: Secondary | ICD-10-CM

## 2019-03-17 ENCOUNTER — Telehealth: Payer: Self-pay | Admitting: Dietician

## 2019-03-17 DIAGNOSIS — E119 Type 2 diabetes mellitus without complications: Secondary | ICD-10-CM

## 2019-03-17 MED ORDER — ACCU-CHEK GUIDE W/DEVICE KIT: 1.0000 | PACK | Freq: Two times a day (BID) | 1 refills | Status: DC

## 2019-03-17 MED ORDER — GLUCOSE BLOOD VI STRP: ORAL_STRIP | 9 refills | Status: DC

## 2019-03-17 MED ORDER — ACCU-CHEK FASTCLIX LANCETS MISC: 9 refills | Status: DC

## 2019-03-17 NOTE — Telephone Encounter (Signed)
[name] calls requesting an updated meter to check her blood sugar two times a day.

## 2019-04-04 ENCOUNTER — Other Ambulatory Visit: Payer: Self-pay

## 2019-04-04 DIAGNOSIS — J441 Chronic obstructive pulmonary disease with (acute) exacerbation: Secondary | ICD-10-CM

## 2019-04-04 MED ORDER — PEN NEEDLES 32G X 4 MM MISC: 1.8000 mg | Freq: Every day | 2 refills | Status: DC

## 2019-04-04 MED ORDER — FLUTICASONE-UMECLIDIN-VILANT 100-62.5-25 MCG/INH IN AEPB: 1.0000 | INHALATION_SPRAY | Freq: Every day | RESPIRATORY_TRACT | 1 refills | Status: DC

## 2019-04-04 NOTE — Telephone Encounter (Signed)
Also needs pen needles for victoza pens. I asked Debera Lat -stated 32 x 4 pen needles. Thanks

## 2019-04-04 NOTE — Telephone Encounter (Signed)
  Requesting a refill on   Fluticasone-Umeclidin-Vilant (TRELEGY ELLIPTA) 100-62.5-25 MCG/INH AEPB   And needles for Victoza @  [place], [place] [phone] (Phone) [phone] (Fax)

## 2019-04-06 ENCOUNTER — Encounter: Payer: Self-pay | Admitting: *Deleted

## 2019-04-12 ENCOUNTER — Other Ambulatory Visit: Payer: Self-pay

## 2019-04-12 NOTE — Telephone Encounter (Signed)
Lm for rtc 

## 2019-04-12 NOTE — Telephone Encounter (Signed)
Needs to speak with a nurse about   Fluticasone-Umeclidin-Vilant (TRELEGY ELLIPTA) 100-62.5-25 MCG/INH AEPB   Please call back.

## 2019-04-15 ENCOUNTER — Telehealth: Payer: Self-pay | Admitting: Cardiovascular Disease

## 2019-04-15 NOTE — Telephone Encounter (Signed)
VIDEO/Doximity Visit on 04/18/2019     Phone call to obtain consent  -  04/15/2019         Virtual Visit Pre-Appointment Phone Call  Steps For Call:  1. Confirm consent - "In the setting of the current Covid19 crisis, you are scheduled for a (phone or video) visit with your provider on (date) at (time).  Just as we do with many in-office visits, in order for you to participate in this visit, we must obtain consent.  If you'd like, I can send this to your mychart (if signed up) or email for you to review.  Otherwise, I can obtain your verbal consent now.  All virtual visits are billed to your insurance company just like a normal visit would be.  By agreeing to a virtual visit, we'd like you to understand that the technology does not allow for your provider to perform an examination, and thus may limit your provider's ability to fully assess your condition. If your provider identifies any concerns that need to be evaluated in person, we will make arrangements to do so.  Finally, though the technology is pretty good, we cannot assure that it will always work on either your or our end, and in the setting of a video visit, we may have to convert it to a phone-only visit.  In either situation, we cannot ensure that we have a secure connection.  Are you willing to proceed?" STAFF: Did the patient verbally acknowledge consent to telehealth visit? Document YES/NO here: YES   2. Confirm the BEST phone number to call the day of the visit by including in appointment notes  3. Give patient instructions for WebEx/MyChart download to smartphone as below or Doximity/Doxy.me if video visit (depending on what platform provider is using)  4. Advise patient to be prepared with their blood pressure, heart rate, weight, any heart rhythm information, their current medicines, and a piece of paper and pen handy for any instructions they may receive the day of their visit  5. Inform patient they will receive a  phone call 15 minutes prior to their appointment time (may be from unknown caller ID) so they should be prepared to answer  6. Confirm that appointment type is correct in Epic appointment notes (VIDEO vs PHONE)     TELEPHONE CALL NOTE  [name] has been deemed a candidate for a follow-up tele-health visit to limit community exposure during the Covid-19 pandemic. I spoke with the patient via phone to ensure availability of phone/video source, confirm preferred email & phone number, and discuss instructions and expectations.  I reminded [name] to be prepared with any vital sign and/or heart rhythm information that could potentially be obtained via home monitoring, at the time of her visit. I reminded [name] to expect a phone call at the time of her visit if her visit.  Thayer Headings 04/15/2019 10:39 AM   INSTRUCTIONS FOR DOWNLOADING THE WEBEX APP TO SMARTPHONE  - If Apple, ask patient to go to App Store and type in WebEx in the search bar. [place] [employer], the blue/green circle. If Android, go to [employer] and type in [employer] in the search bar. The app is free but as with any other app downloads, their phone may require them to verify saved payment information or Apple/Android password.  - The patient does NOT have to create an account. - On the day of the visit, the assist will walk the  patient through joining the meeting with the meeting number/password.  INSTRUCTIONS FOR DOWNLOADING THE MYCHART APP TO SMARTPHONE  - The patient must first make sure to have activated MyChart and know their login information - If Apple, go to [employer] and type in MyChart in the search bar and download the app. If Android, ask patient to go to [employer] and type in [place] in the search bar and download the app. The app is free but as with any other app downloads, their phone may require them to verify saved payment information or Apple/Android  password.  - The patient will need to then log into the app with their MyChart username and password, and select [facility] as their healthcare provider to link the account. When it is time for your visit, go to the MyChart app, find appointments, and click Begin Video Visit. Be sure to Select Allow for your device to access the Microphone and Camera for your visit. You will then be connected, and your provider will be with you shortly.  **If they have any issues connecting, or need assistance please contact MyChart service desk [phone])**  **If using a computer, in order to ensure the best quality for their visit they will need to use either of the following Internet Browsers: [employer], or Google Chrome**  IF USING DOXIMITY or DOXY.ME - The patient will receive a link just prior to their visit, either by text or email (to be determined day of appointment depending on if it's doxy.me or Doximity).     FULL LENGTH CONSENT FOR TELE-HEALTH VISIT   I hereby voluntarily request, consent and authorize [facility] and its employed or contracted physicians, physician assistants, nurse practitioners or other licensed health care professionals (the Practitioner), to provide me with telemedicine health care services (the Services") as deemed necessary by the treating Practitioner. I acknowledge and consent to receive the Services by the Practitioner via telemedicine. I understand that the telemedicine visit will involve communicating with the Practitioner through live audiovisual communication technology and the disclosure of certain medical information by electronic transmission. I acknowledge that I have been given the opportunity to request an in-person assessment or other available alternative prior to the telemedicine visit and am voluntarily participating in the telemedicine visit.  I understand that I have the right to withhold or withdraw my consent to the use of  telemedicine in the course of my care at any time, without affecting my right to future care or treatment, and that the Practitioner or I may terminate the telemedicine visit at any time. I understand that I have the right to inspect all information obtained and/or recorded in the course of the telemedicine visit and may receive copies of available information for a reasonable fee.  I understand that some of the potential risks of receiving the Services via telemedicine include:   Delay or interruption in medical evaluation due to technological equipment failure or disruption;  Information transmitted may not be sufficient (e.g. poor resolution of images) to allow for appropriate medical decision making by the Practitioner; and/or   In rare instances, security protocols could fail, causing a breach of personal health information.  Furthermore, I acknowledge that it is my responsibility to provide information about my medical history, conditions and care that is complete and accurate to the best of my ability. I acknowledge that Practitioner's advice, recommendations, and/or decision may be based on factors not within their control, such as incomplete or inaccurate data provided by  me or distortions of diagnostic images or specimens that may result from electronic transmissions. I understand that the practice of medicine is not an exact science and that Practitioner makes no warranties or guarantees regarding treatment outcomes. I acknowledge that I will receive a copy of this consent concurrently upon execution via email to the email address I last provided but may also request a printed copy by calling the office of [facility].    I understand that my insurance will be billed for this visit.   I have read or had this consent read to me.  I understand the contents of this consent, which adequately explains the benefits and risks of the Services being provided via telemedicine.   I have been  provided ample opportunity to ask questions regarding this consent and the Services and have had my questions answered to my satisfaction.  I give my informed consent for the services to be provided through the use of telemedicine in my medical care  By participating in this telemedicine visit I agree to the above.

## 2019-04-18 ENCOUNTER — Other Ambulatory Visit: Payer: Self-pay

## 2019-04-18 ENCOUNTER — Telehealth (INDEPENDENT_AMBULATORY_CARE_PROVIDER_SITE_OTHER): Payer: Medicaid Other | Admitting: Cardiovascular Disease

## 2019-04-18 ENCOUNTER — Encounter: Payer: Self-pay | Admitting: Cardiovascular Disease

## 2019-04-18 VITALS — Ht 60.0 in | Wt 320.0 lb

## 2019-04-18 DIAGNOSIS — E7800 Pure hypercholesterolemia, unspecified: Secondary | ICD-10-CM

## 2019-04-18 DIAGNOSIS — I2510 Atherosclerotic heart disease of native coronary artery without angina pectoris: Secondary | ICD-10-CM

## 2019-04-18 DIAGNOSIS — I350 Nonrheumatic aortic (valve) stenosis: Secondary | ICD-10-CM | POA: Diagnosis not present

## 2019-04-18 NOTE — Telephone Encounter (Signed)
Called [employer],talked to [employer] - stated PA is needed for Trelegy. Pt was called ann informed of PA. Request given to [facility].

## 2019-04-18 NOTE — Telephone Encounter (Signed)
Call to [facility] for PA for Trelegy.  Information given to representative.  Trelegy was approved 04/18/2019 thru 04/13/2019.  [place] was called and informed of.  [place] [id number]. I [id number].  Sander Nephew, RN 04/18/2019 11:59 AM.

## 2019-04-18 NOTE — Telephone Encounter (Signed)
Fluticasone-Umeclidin-Vilant (TRELEGY ELLIPTA) 100-62.5-25 MCG/INH AEPB   REFILL REQUEST @  [place], [place] [phone] (Phone) [phone] (Fax)

## 2019-04-18 NOTE — Patient Instructions (Signed)

## 2019-04-18 NOTE — Progress Notes (Signed)
Virtual Visit via Video Note   This visit type was conducted due to national recommendations for restrictions regarding the COVID-19 Pandemic (e.g. social distancing) in an effort to limit this patient's exposure and mitigate transmission in our community.  Due to her co-morbid illnesses, this patient is at least at moderate risk for complications without adequate follow up.  This format is felt to be most appropriate for this patient at this time.  All issues noted in this document were discussed and addressed.  A limited physical exam was performed with this format.  Please refer to the patient's chart for her consent to telehealth for [facility].   Evaluation Performed:  Follow-up visit  Date:  04/18/2019   ID:  [name], DOB 08-19-1961, MRN [id number]  Patient Location: Home Provider Location: Office  PCP:  Lorella Nimrod, MD  Cardiologist:  Lauree Chandler, MD  Electrophysiologist:  None   Chief Complaint:  Follow up- CAD  History of Present Illness:    [name] is a 58 y.o. female with history of severe aortic stenosis s/p TAVR,  CAD, OSA, HTN, HLD, obesity, GERD, asthma, COPD, former tobacco abuse and depression who is being seen today by virtual e-visit due to the Covid19 pandemic. She had a cardiac cath in February 2010 and had a drug eluting stent placed in the distal RCA. Her LAD and Circumflex were free of disease. She had her right knee replaced in February 7408, complicated by a PE. She was started on coumadin and treated for 6 months. CTA chest on 08/09/12 showed no evidence of PE so her coumadin was stopped. I saw her in February 2014 and she c/o severe dyspnea with minimal exertion. I felt that this was multi-factorial. Chest CTA with no PE. I arranged a stress myoview which showed possible ischemia. Echo with severe AS, normal LV function. Cardiac cath on 03/17/13 with mild non-obstructive disease and patent stent RCA. She continued to have  severe dyspnea. Most recent cath 09/14/14 with 70% ostial RCA stenosis treated medically and no obstructive disease in the LAD or Circumflex. She underwent TAVR on 12/12/14 and did well. (26 mm Sapien 3 bioprosthetic valve). Echo December 2016 with normally functioning bioprosthetic aortic valve. She is now on Praluent as well as a statin.   She tells me today that she is feeling well. No chest pain. No change in baseline dyspn  The patient does not have symptoms concerning for COVID-19 infection (fever, chills, cough, or new shortness of breath).    Past Medical History:  Diagnosis Date   Anemia    Aortic valve stenosis, severe    Arthritis    PAIN AND SEVERE OA LEFT KNEE ; S/P RIGHT TKA ON 02/03/12; HAS LOWER BACK PAIN-UNABLE TO STAND MORE THAN 10 MIN; ARTHRITIS "ALL OVER"   Asthma    Blood transfusion    [facility]   Breast cancer in female [facility])    Right   CAD (coronary artery disease)    Cath 2010 with DES x 1 RCA-- PT'S CARDIOLOGIST IS DR. Montee Tallman   Chronic diastolic congestive heart failure (HCC)    COPD (chronic obstructive pulmonary disease) ([place])    Depression    Diabetes mellitus DIAGNOSED IN2010   Controll s with diet   Eczema    on back   Hyperlipidemia    Hypertension    Kidney stones    Morbid obesity with body mass index of 60.0-69.9 in adult [facility])  Myocardial infarction ([place])    PT THINKS SHE WAS DX WITH MI AT THE TIME OF HEART STENTING   Pulmonary embolism ([place]) 02/08/12   S/P RT TOTAL KNEE ON 02/03/12--ON 02/08/12--DEVELOPED ACUTE SOB AND CHEST PAIN--AND DIAGNOSED WITH  PULMONARY EMBOLUS AND PNEUMONIA   Restless leg syndrome    Sleep apnea    uses 3 liters O2 at night    Uterine fibroid    NO PROBLEMS AT PRESENT FROM THE FIBROIDS-STATES SHE IS POST MENOPAUSAL-LAST MENSES 2010 EXCEPT FOR EPISODE THIS YR OF BLEEDING RELATED TO FIBROIDS.   Weakness    BOTH HANDS - S/P BILATERAL CARPAL TUNNEL RELEASE--BUT STILL HAS WEAKNESS--OFTEN DROPS THINGS    Past Surgical History:  Procedure Laterality Date   BREAST LUMPECTOMY WITH RADIOACTIVE SEED AND SENTINEL LYMPH NODE BIOPSY Right 07/19/2018   Procedure: BREAST LUMPECTOMY WITH RADIOACTIVE SEED AND SENTINEL LYMPH NODE BIOPSY;  Surgeon: Alphonsa Overall, MD;  Location: [place];  Service: General;  Laterality: Right;   CARDIAC CATHETERIZATION     CARPAL TUNNEL RELEASE     Bilateral   CHOLECYSTECTOMY     CORONARY ANGIOPLASTY     2010 has stent in place   CYSTOSCOPY W/ RETROGRADES Right 09/21/2013   Procedure: CYSTOSCOPY WITH RIGHT RETROGRADE PYELOGRAM RIGHT DOUBLE J STENT ;  Surgeon: Fredricka Bonine, MD;  [facility];  Service: Urology;  Laterality: Right;   CYSTOSCOPY WITH URETEROSCOPY AND STENT PLACEMENT Right 10/25/2013   Procedure: CYSTOSCOPY RIGHT URETEROSCOPY HOLMIUM LASER LITHO AND STENT PLACEMENT;  Surgeon: Fredricka Bonine, MD;  [facility];  Service: Urology;  Laterality: Right;   HERNIA REPAIR     INTRAOPERATIVE TRANSESOPHAGEAL ECHOCARDIOGRAM N/A 12/12/2014   Procedure: INTRAOPERATIVE TRANSESOPHAGEAL ECHOCARDIOGRAM;  Surgeon: Burnell Blanks, MD;  Location: [facility];  Service: Cardiovascular;  Laterality: N/A;   JOINT REPLACEMENT     bil total knees   KNEE ARTHROPLASTY  02/03/2012   Procedure: COMPUTER ASSISTED TOTAL KNEE ARTHROPLASTY;  Surgeon: Mcarthur Rossetti, MD;  Location: [facility];  Service: Orthopedics;  Laterality: Right;  Right total knee arthroplasty   LEFT AND RIGHT HEART CATHETERIZATION WITH CORONARY ANGIOGRAM N/A 03/17/2013   Procedure: LEFT AND RIGHT HEART CATHETERIZATION WITH CORONARY ANGIOGRAM;  Surgeon: Burnell Blanks, MD;  Location: [facility] C[facility] LAB;  Service: Cardiovascular;  Laterality: N/A;   LEFT AND RIGHT HEART CATHETERIZATION WITH CORONARY/GRAFT ANGIOGRAM N/A 09/14/2014   Procedure: LEFT AND RIGHT HEART CATHETERIZATION WITH Beatrix Fetters;  Surgeon: Burnell Blanks, MD;  Location: [facility] CATH LAB;  Service:  Cardiovascular;  Laterality: N/A;   TEE WITHOUT CARDIOVERSION N/A 03/14/2013   Procedure: TRANSESOPHAGEAL ECHOCARDIOGRAM (TEE);  Surgeon: Lelon Perla, MD;  Location: [facility] ENDOSCOPY;  Service: Cardiovascular;  Laterality: N/A;   TEE WITHOUT CARDIOVERSION N/A 11/14/2014   Procedure: TRANSESOPHAGEAL ECHOCARDIOGRAM (TEE);  Surgeon: Lelon Perla, MD;  Location: [facility] ENDOSCOPY;  Service: Cardiovascular;  Laterality: N/A;   TOTAL KNEE ARTHROPLASTY  09/10/2012   Procedure: TOTAL KNEE ARTHROPLASTY;  Surgeon: Mcarthur Rossetti, MD;  [facility];  Service: Orthopedics;  Laterality: Left;   TOTAL KNEE REVISION Right 07/15/2013   Procedure: REVISION ARTHROPLASTY RIGHT KNEE;  Surgeon: Mcarthur Rossetti, MD;  [facility];  Service: Orthopedics;  Laterality: Right;   TRANSCATHETER AORTIC VALVE REPLACEMENT, TRANSFEMORAL N/A 12/12/2014   Procedure: TRANSCATHETER AORTIC VALVE REPLACEMENT, TRANSFEMORAL;  Surgeon: Burnell Blanks, MD;  Location: [facility];  Service: Cardiovascular;  Laterality: N/A;   TRIGGER FINGER RELEASE  09/10/2012   Procedure: RELEASE TRIGGER FINGER/A-1  PULLEY;  Surgeon: Mcarthur Rossetti, MD;  [facility];  Service: Orthopedics;  Laterality: Right;  Right Ring Finger   TUBAL LIGATION       Current Meds  Medication Sig   Accu-Chek FastClix Lancets MISC Check blood sugar two times a day   albuterol (PROVENTIL HFA) 108 (90 Base) MCG/ACT inhaler INHALE 2 PUFFS BY MOUTH EVERY 4 HOURS AS NEEDED FOR COUGHING, WHEEZING, OR SHORTNESS OF BREATH   albuterol (PROVENTIL) (2.5 MG/3ML) 0.083% nebulizer solution Take 3 mLs (2.5 mg total) by nebulization every 4 (four) hours as needed for wheezing or shortness of breath.   Alirocumab (PRALUENT) 150 MG/ML SOPN Inject 150 mg into the skin every 14 (fourteen) days.   ASPIRIN ADULT LOW STRENGTH 81 MG EC tablet TAKE 1 Tablet BY MOUTH ONCE DAILY   atorvastatin (LIPITOR) 80 MG tablet Take 1 tablet (80 mg total) by  mouth daily.   Blood Glucose Monitoring Suppl (ACCU-CHEK GUIDE) w/Device KIT 1 each by Does not apply route 2 (two) times daily.   clopidogrel (PLAVIX) 75 MG tablet Take 1 tablet (75 mg total) by mouth every morning.   DULoxetine (CYMBALTA) 30 MG capsule Take 1 capsule (30 mg total) by mouth 2 (two) times daily.   exemestane (AROMASIN) 25 MG tablet Take 1 tablet (25 mg total) by mouth daily after breakfast.   ezetimibe (ZETIA) 10 MG tablet Take 1 tablet (10 mg total) by mouth daily.   Fluticasone-Umeclidin-Vilant (TRELEGY ELLIPTA) 100-62.5-25 MCG/INH AEPB Inhale 1 puff into the lungs daily.   furosemide (LASIX) 40 MG tablet Take 1 tablet (40 mg total) by mouth 2 (two) times daily.   gabapentin (NEURONTIN) 300 MG capsule TAKE 3 CAPSULES BY MOUTH AT BEDTIME   glucose blood (ACCU-CHEK GUIDE) test strip Check blood sugar 2 times per day   hydrochlorothiazide (HYDRODIURIL) 25 MG tablet Take 1 tablet (25 mg total) by mouth daily.   ibuprofen (ADVIL,MOTRIN) 200 MG tablet Take 400 mg by mouth every 6 (six) hours as needed for moderate pain.   Insulin Pen Needle (PEN NEEDLES) 32G X 4 MM MISC 1.8 mg by Does not apply route daily.   liraglutide (VICTOZA) 18 MG/3ML SOPN Inject 0.3 mLs (1.8 mg total) into the skin daily.   losartan (COZAAR) 100 MG tablet Take 1 tablet (100 mg total) by mouth daily.   metFORMIN (GLUCOPHAGE XR) 500 MG 24 hr tablet Take 2 tablets (1,000 mg total) by mouth 2 (two) times daily.   metoprolol tartrate (LOPRESSOR) 25 MG tablet Take 1 tablet (25 mg total) by mouth 2 (two) times daily.   nitroGLYCERIN (NITROSTAT) 0.4 MG SL tablet Place 1 tablet (0.4 mg total) under the tongue every 5 (five) minutes as needed for chest pain.   OXYGEN Inhale 3 L into the lungs daily.   potassium chloride SA (K-DUR,KLOR-CON) 20 MEQ tablet Take 2 tablets (40 mEq total) by mouth daily.     Allergies:   Patient has no known allergies.   Social History   Tobacco Use   Smoking  status: Former Smoker    Packs/day: 1.50    Years: 30.00    Pack years: 45.00    Types: Cigarettes    Last attempt to quit: 12/29/2000    Years since quitting: 18.3   Smokeless tobacco: Never Used  Substance Use Topics   Alcohol use: Yes    Alcohol/week: 1.0 standard drinks    Types: 1 Cans of beer per week    Frequency: Never    Comment:  rarely   Drug use: No     Family Hx: The patient's family history includes Asthma in her father; Breast cancer in her mother; COPD in her father; Cancer in her brother; Emphysema in her mother; Heart disease in her father and mother.  ROS:   Please see the history of present illness.    All other systems reviewed and are negative.   Prior CV studies:   The following studies were reviewed today:   Labs/Other Tests and Data Reviewed:    EKG:  No ECG reviewed.  Recent Labs: 01/19/2019: BUN 14; Creatinine 0.84; Hemoglobin 12.4; Platelet Count 258; Potassium 3.9; Sodium 137 01/31/2019: ALT 15   Recent Lipid Panel Lab Results  Component Value Date/Time   CHOL 97 (L) 01/31/2019 12:00 PM   TRIG 104 01/31/2019 12:00 PM   HDL 50 01/31/2019 12:00 PM   CHOLHDL 1.9 01/31/2019 12:00 PM   CHOLHDL 3.7 12/28/2015 10:52 AM   LDLCALC 26 01/31/2019 12:00 PM   LDLDIRECT 162.0 10/17/2013 09:05 AM    Wt Readings from Last 3 Encounters:  04/18/19 (!) 320 lb (145.2 kg)  01/19/19 (!) 308 lb 1.6 oz (139.8 kg)  12/27/18 (!) 310 lb 3.2 oz (140.7 kg)     Objective:    Vital Signs:  Ht 5' (1.524 m)    Wt (!) 320 lb (145.2 kg)    LMP 02/03/2012    BMI 62.50 kg/m    VITAL SIGNS:  reviewed GEN:  no acute distress  ASSESSMENT & PLAN:    1. Severe Aortic valve stenosis: She is s/p TAVR on 12/12/14. Her valve was working well by echo in December 2016. Continue ASA and Plavix. She is aware that she should use SBE prophylaxis prior to select procedures including dental visits.    2. CAD without angina: Cardiac cath September 2015 with moderate ostial RCA  stenosis and no disease in the LAD or Circumflex. She has no chest pain suggestive of angina. Continue ASA, Plavix, statin, beta blocker and ARB.    3. Dyspnea: No recent change. Multi-factorial due to obesity, deconditioning, CAD, asthma and chronic thromboembolic disease.   4. Hyperlipidemia: LDL at goal on Praluent, zetia and statin.   5. Morbid obesity: I have advised weight loss.   COVID-19 Education: The signs and symptoms of COVID-19 were discussed with the patient and how to seek care for testing (follow up with PCP or arrange E-visit).  The importance of social distancing was discussed today.  Time:   Today, I have spent 15 minutes with the patient with telehealth technology discussing the above problems.     Medication Adjustments/Labs and Tests Ordered: Current medicines are reviewed at length with the patient today.  Concerns regarding medicines are outlined above.   Tests Ordered: No orders of the defined types were placed in this encounter.   Medication Changes: No orders of the defined types were placed in this encounter.   Disposition:  Follow up in 6 month(s)  Signed, Lauree Chandler, MD  04/18/2019 2:49 PM    [place]

## 2019-04-19 NOTE — Telephone Encounter (Signed)
Called pt - stated her concern w/Trelegy has been resolved. PA was done per Regino Schultze H.

## 2019-04-20 NOTE — Telephone Encounter (Signed)
Thank you :)

## 2019-04-25 ENCOUNTER — Other Ambulatory Visit: Payer: Self-pay | Admitting: Internal Medicine

## 2019-04-25 DIAGNOSIS — E119 Type 2 diabetes mellitus without complications: Secondary | ICD-10-CM

## 2019-04-25 NOTE — Telephone Encounter (Signed)
telehealth visit scheduled for 05/02/19.[name], [name] Cassady4/27/20203:01 PM

## 2019-05-02 ENCOUNTER — Ambulatory Visit (INDEPENDENT_AMBULATORY_CARE_PROVIDER_SITE_OTHER): Payer: Medicaid Other | Admitting: Internal Medicine

## 2019-05-02 DIAGNOSIS — E119 Type 2 diabetes mellitus without complications: Secondary | ICD-10-CM | POA: Diagnosis not present

## 2019-05-02 DIAGNOSIS — M545 Low back pain, unspecified: Secondary | ICD-10-CM

## 2019-05-02 DIAGNOSIS — E1159 Type 2 diabetes mellitus with other circulatory complications: Secondary | ICD-10-CM

## 2019-05-02 DIAGNOSIS — J441 Chronic obstructive pulmonary disease with (acute) exacerbation: Secondary | ICD-10-CM

## 2019-05-02 DIAGNOSIS — J449 Chronic obstructive pulmonary disease, unspecified: Secondary | ICD-10-CM

## 2019-05-02 DIAGNOSIS — I110 Hypertensive heart disease with heart failure: Secondary | ICD-10-CM | POA: Diagnosis not present

## 2019-05-02 DIAGNOSIS — I5032 Chronic diastolic (congestive) heart failure: Secondary | ICD-10-CM

## 2019-05-02 DIAGNOSIS — Z7984 Long term (current) use of oral hypoglycemic drugs: Secondary | ICD-10-CM

## 2019-05-02 DIAGNOSIS — I10 Essential (primary) hypertension: Secondary | ICD-10-CM

## 2019-05-02 DIAGNOSIS — I152 Hypertension secondary to endocrine disorders: Secondary | ICD-10-CM

## 2019-05-02 MED ORDER — DICLOFENAC SODIUM 1 % TD GEL: 4.0000 g | Freq: Four times a day (QID) | TRANSDERMAL | 2 refills | Status: DC

## 2019-05-02 NOTE — Assessment & Plan Note (Signed)
Patient denies any worsening exertional dyspnea or chest pain.

## 2019-05-02 NOTE — Assessment & Plan Note (Signed)
Per patient she is taking her blood sugar daily around 10 AM, most of the time it is after breakfast, occasionally before eating.  Her blood sugar remains between 150-175 most of the time with couple of higher readings. She has increased her Victoza to 18 mg daily and taking Metformin 1000 mg twice daily. Patient she is also trying to watch her diet and trying to lose some weight.  -Continue with current management and we will reassess during next follow-up visit in clinic.

## 2019-05-02 NOTE — Assessment & Plan Note (Signed)
Her symptoms of lower back pain with pain radiating to both legs are more consistent with degenerative changes. No red flag symptoms at this time.  Advised patient to take naproxen 500 mg twice daily for 5 days then she can use it as needed. She can also try using Voltaren gel for her back and hands arthritis.  A prescription was sent to her pharmacy. Advised her to try doing some back exercises and avoid activities which aggravates her pain. She will give [place] a call if her pain get worse or developed any new red flag symptoms.

## 2019-05-02 NOTE — Progress Notes (Signed)
Internal Medicine Clinic Attending  Case discussed with Dr. Amin at the time of the visit.  We reviewed the resident's history and exam and pertinent patient test results.  I agree with the assessment, diagnosis, and plan of care documented in the resident's note.    

## 2019-05-02 NOTE — Assessment & Plan Note (Signed)
Patient does not check her blood pressure at home but is compliant with her medication. She denies any headache or dizziness. He is trying to follow a low-salt diet and keeping herself hydrated.  Continue current management and we will reassess her during next follow-up visit in clinic.

## 2019-05-02 NOTE — Progress Notes (Signed)
   This is a telephone encounter between [name] and [name] on 05/02/2019 for CC. The visit was conducted with the patient located at home and [place] [facility]. The patient's identity was confirmed using their DOB and current address. The patient has consented to being evaluated through a telephone encounter and understands the associated risks (an examination cannot be done and the patient may need to come in for an appointment) / benefits (allows the patient to remain at home, decreasing exposure to coronavirus). I personally spent 15 minutes on medical discussion.   CC: For follow-up of her hypertension and diabetes.  HPI:  [name] is a 58 y.o. with past medical history as listed below was given a call to follow-up on her chronic conditions.  She was complaining of lower back pain radiating to both legs up to ankles.  Denies any tingling or numbness.  No focal weakness.  No saddle anesthesia.  No urinary or fecal incontinence.  Pain get worse with standing or walking.  Resting helps.  She was taking ibuprofen or Tylenol with some relief. She was also complaining of worsening arthritis in her both hands with intermittent swelling.  Please see assessment and plan for her chronic conditions.  Past Medical History:  Diagnosis Date  . Anemia   . Aortic valve stenosis, severe   . 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 Asthma   . Blood transfusion    2013[facility]  . Breast cancer in female [facility])    Right  . CAD (coronary artery disease)    Cath 2010 with DES x 1 RCA-- PT'S CARDIOLOGIST IS DR. [place]  . Chronic diastolic congestive heart failure ([place])   . COPD (chronic obstructive pulmonary disease) ([place])   . Depression   . Diabetes mellitus DIAGNOSED IN2010   Controll s with diet  . Eczema    on back  . Hyperlipidemia   . Hypertension   . Kidney  stones   . Morbid obesity with body mass index of 60.0-69.9 in adult [facility])   . Myocardial infarction ([place])    PT THINKS SHE WAS DX WITH MI AT THE TIME OF HEART STENTING  . Pulmonary embolism ([place]) 02/08/12   S/P RT TOTAL KNEE ON 02/03/12--ON 02/08/12--DEVELOPED ACUTE SOB AND CHEST PAIN--AND DIAGNOSED WITH  PULMONARY EMBOLUS AND PNEUMONIA  . Restless leg syndrome   . Sleep apnea    uses 3 liters O2 at night   . Uterine fibroid    NO PROBLEMS AT PRESENT FROM THE FIBROIDS-STATES SHE IS POST MENOPAUSAL-LAST MENSES 2010 EXCEPT FOR EPISODE THIS YR OF BLEEDING RELATED TO FIBROIDS.  Marland Kitchen Weakness    BOTH HANDS - S/P BILATERAL CARPAL TUNNEL RELEASE--BUT STILL HAS WEAKNESS--OFTEN DROPS THINGS   Review of Systems: Negative except mentioned in HPI.  Physical Exam:  There were no vitals filed for this visit. No physical exam was performed as it was a tele-visit.  Assessment & Plan:   See Encounters Tab for problem based charting.  Patient discussed with Dr. Dareen Piano.

## 2019-05-02 NOTE — Assessment & Plan Note (Signed)
Patient denies any shortness of breath, stating that Trelegy is really working well for her.  She is trying to give her a break from oxygen, stating that during the day she does not wear oxygen all the time now.  She uses oxygen regularly at night. Patient does not have any pulse ox but states that she knows when her oxygen levels are running low also then she can put it on.  Advised patient to get a pulse ox and keep monitoring her oxygen level if she is not using it.  It should not drop below 90. She will continue with her Trelegy and albuterol as needed.

## 2019-05-04 ENCOUNTER — Telehealth: Payer: Self-pay | Admitting: *Deleted

## 2019-05-04 NOTE — Telephone Encounter (Signed)
Call to [employer] for PA for Diclofenac Gel 1%.  Approved 05/04/2019 through 06/03/2019.  [employer] was called at [phone] and notified of the approval.  PA [phone].  I [id number].  Sander Nephew, RN 05/04/2019 1:38 PM.

## 2019-05-24 ENCOUNTER — Encounter (HOSPITAL_COMMUNITY): Payer: Self-pay | Admitting: *Deleted

## 2019-06-02 ENCOUNTER — Other Ambulatory Visit: Payer: Self-pay | Admitting: Internal Medicine

## 2019-06-02 DIAGNOSIS — E119 Type 2 diabetes mellitus without complications: Secondary | ICD-10-CM

## 2019-06-08 ENCOUNTER — Telehealth: Payer: Self-pay | Admitting: Internal Medicine

## 2019-06-08 NOTE — Telephone Encounter (Signed)
Returned call to patient. States bilat hand pain has not improved with voltaren gel. States the knuckles are locking up and can barely hold a cup of coffee. Also, notes tingling in right arm she thinks is carpal tunnel returning. No ACC appts available today. Scheduled in [facility] tomorrow at 1045 for in person visit. Hubbard Hartshorn, RN, BSN

## 2019-06-08 NOTE — Telephone Encounter (Signed)
Pt requesting a nurse callback [phone]

## 2019-06-09 ENCOUNTER — Other Ambulatory Visit: Payer: Self-pay

## 2019-06-09 ENCOUNTER — Ambulatory Visit (HOSPITAL_COMMUNITY)
Admission: RE | Admit: 2019-06-09 | Discharge: 2019-06-09 | Disposition: A | Discharge status: Home / Self Care | Payer: Medicaid Other | Source: Ambulatory Visit | Attending: Internal Medicine | Admitting: Internal Medicine

## 2019-06-09 ENCOUNTER — Ambulatory Visit: Payer: Medicaid Other | Admitting: Internal Medicine

## 2019-06-09 VITALS — BP 131/64 | HR 96 | Temp 98.0°F | Ht 60.0 in | Wt 312.3 lb

## 2019-06-09 DIAGNOSIS — J449 Chronic obstructive pulmonary disease, unspecified: Secondary | ICD-10-CM

## 2019-06-09 DIAGNOSIS — Z8261 Family history of arthritis: Secondary | ICD-10-CM | POA: Diagnosis not present

## 2019-06-09 DIAGNOSIS — M25642 Stiffness of left hand, not elsewhere classified: Secondary | ICD-10-CM | POA: Diagnosis not present

## 2019-06-09 DIAGNOSIS — M25649 Stiffness of unspecified hand, not elsewhere classified: Secondary | ICD-10-CM

## 2019-06-09 DIAGNOSIS — Z6841 Body Mass Index (BMI) 40.0 and over, adult: Secondary | ICD-10-CM | POA: Diagnosis not present

## 2019-06-09 DIAGNOSIS — M25641 Stiffness of right hand, not elsewhere classified: Secondary | ICD-10-CM

## 2019-06-09 DIAGNOSIS — M25552 Pain in left hip: Secondary | ICD-10-CM

## 2019-06-09 DIAGNOSIS — G8929 Other chronic pain: Secondary | ICD-10-CM | POA: Diagnosis not present

## 2019-06-09 DIAGNOSIS — Z853 Personal history of malignant neoplasm of breast: Secondary | ICD-10-CM | POA: Diagnosis not present

## 2019-06-09 DIAGNOSIS — M545 Low back pain: Secondary | ICD-10-CM

## 2019-06-09 DIAGNOSIS — R200 Anesthesia of skin: Secondary | ICD-10-CM

## 2019-06-09 DIAGNOSIS — M25551 Pain in right hip: Secondary | ICD-10-CM | POA: Diagnosis not present

## 2019-06-09 DIAGNOSIS — E669 Obesity, unspecified: Secondary | ICD-10-CM | POA: Diagnosis not present

## 2019-06-09 IMAGING — DX RIGHT HAND - COMPLETE 3+ VIEW
3 series · 3 of 3 positions shown · non-contrast
Comparison: None.

CLINICAL DATA: Bilateral hand pain and stiffness several months. No
injury.

EXAM:
RIGHT HAND - COMPLETE 3+ VIEW

[hand pa]
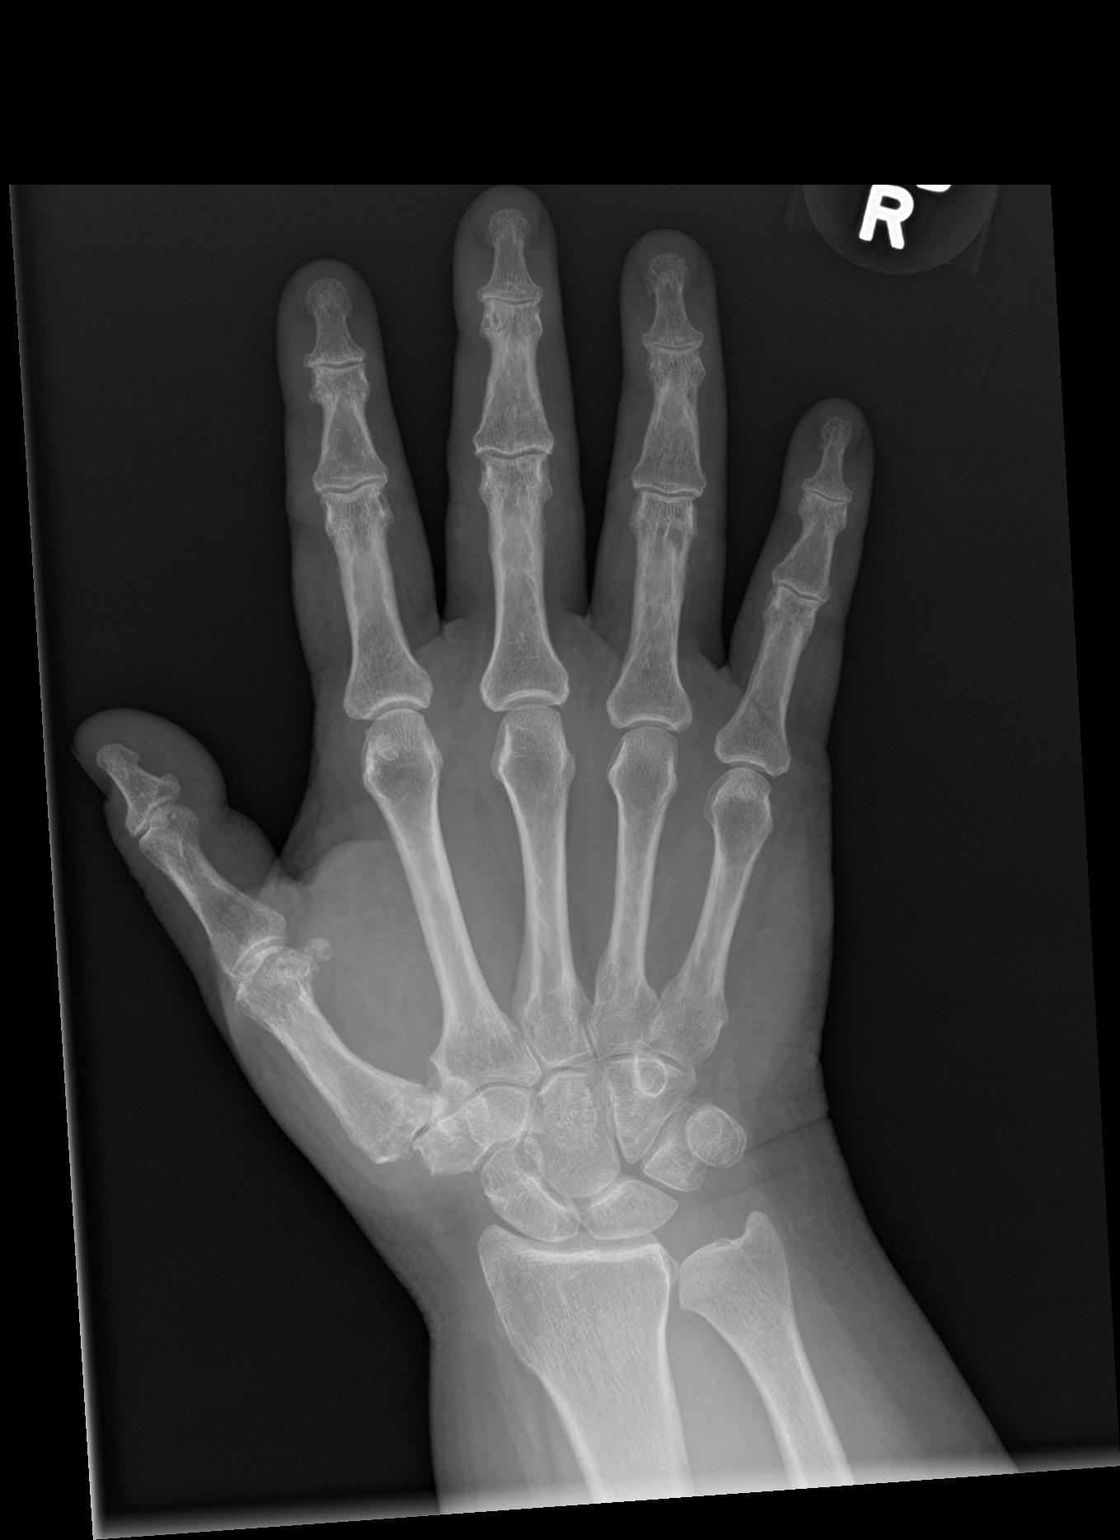

[hand obl]
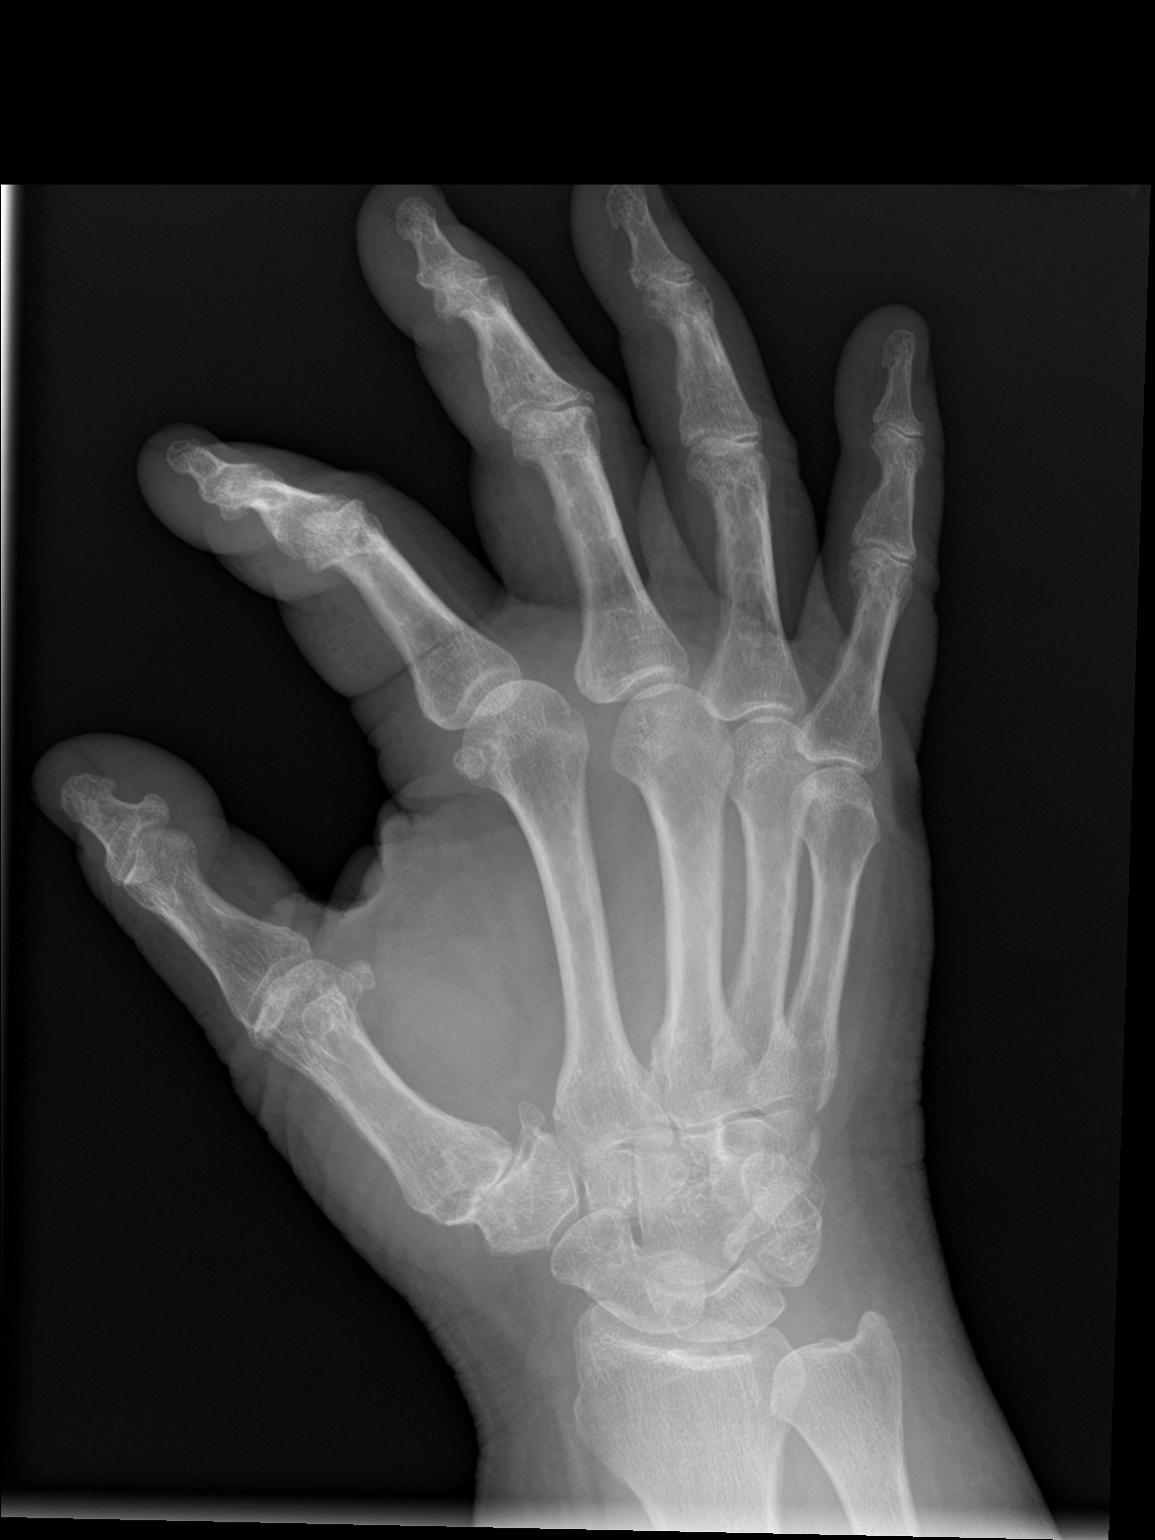

[hand lat]
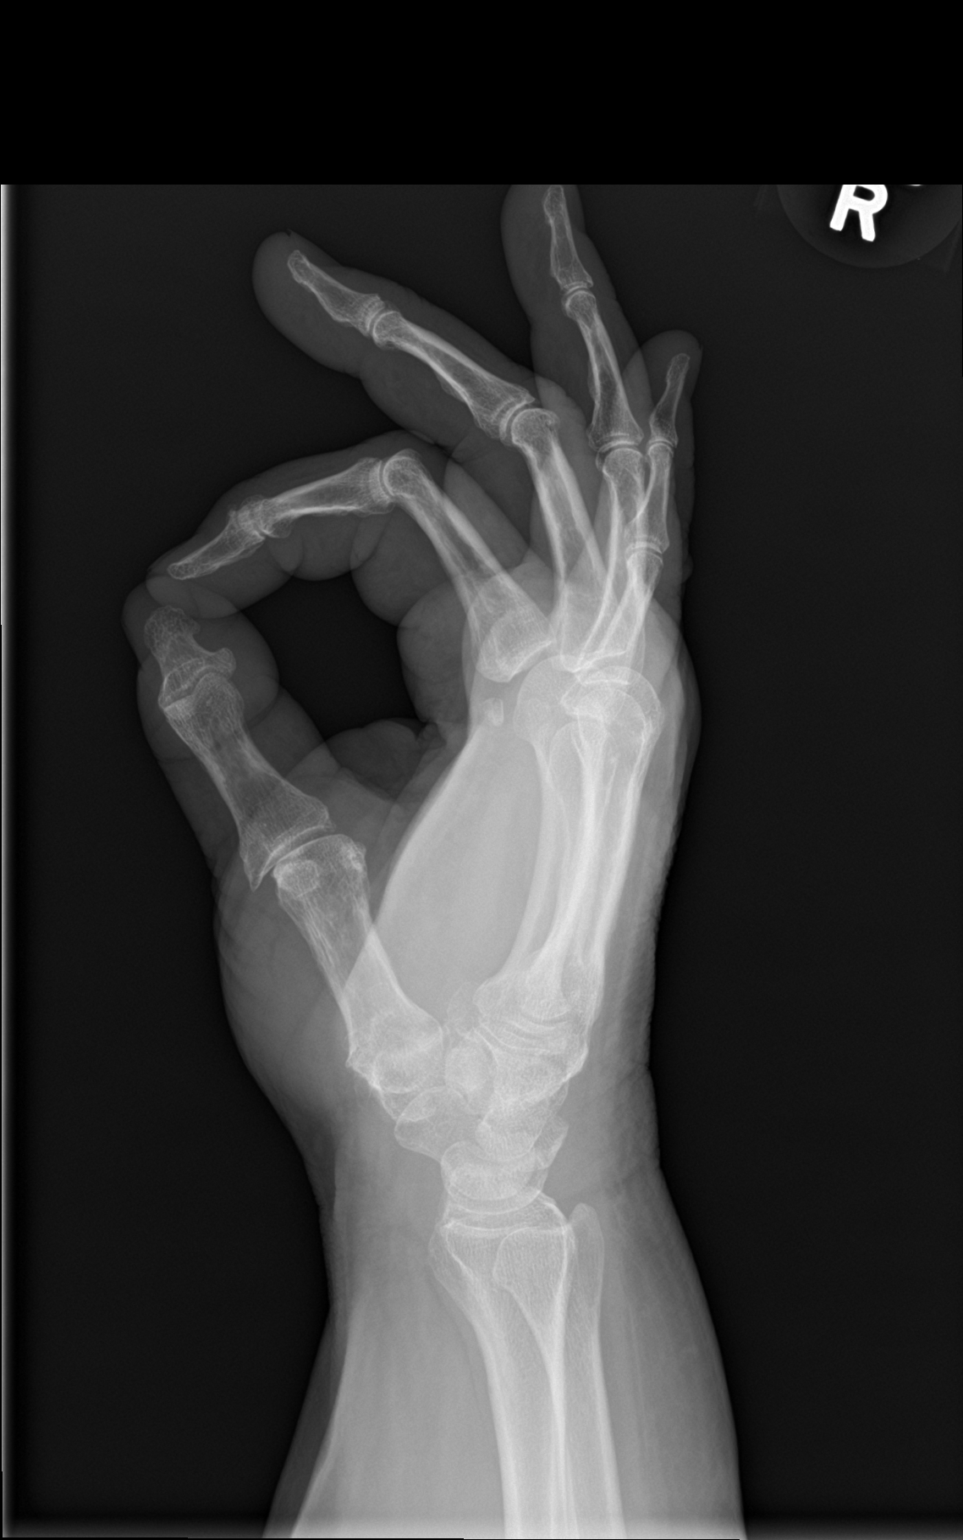

[3 of 3 positions shown; findings below may reference images not displayed]

FINDINGS: Mild ulnar minus variance. Mild degenerative change over the
radiocarpal joint and radial side of the carpal bones/first
carpometacarpal joints. Mild degenerate change at the first MCP
joint as well as the DIP joints. Bony alignment and mineralization
is normal. No bony erosions.
IMPRESSION: No acute findings.

Mild osteoarthritis of the wrist and hand.

## 2019-06-09 IMAGING — DX LEFT HAND - COMPLETE 3+ VIEW
3 series · 3 of 3 positions shown · non-contrast
Comparison: None.

CLINICAL DATA: Bilateral hand pain and stiffness several months. No
injury.

EXAM:
LEFT HAND - COMPLETE 3+ VIEW

[hand pa]
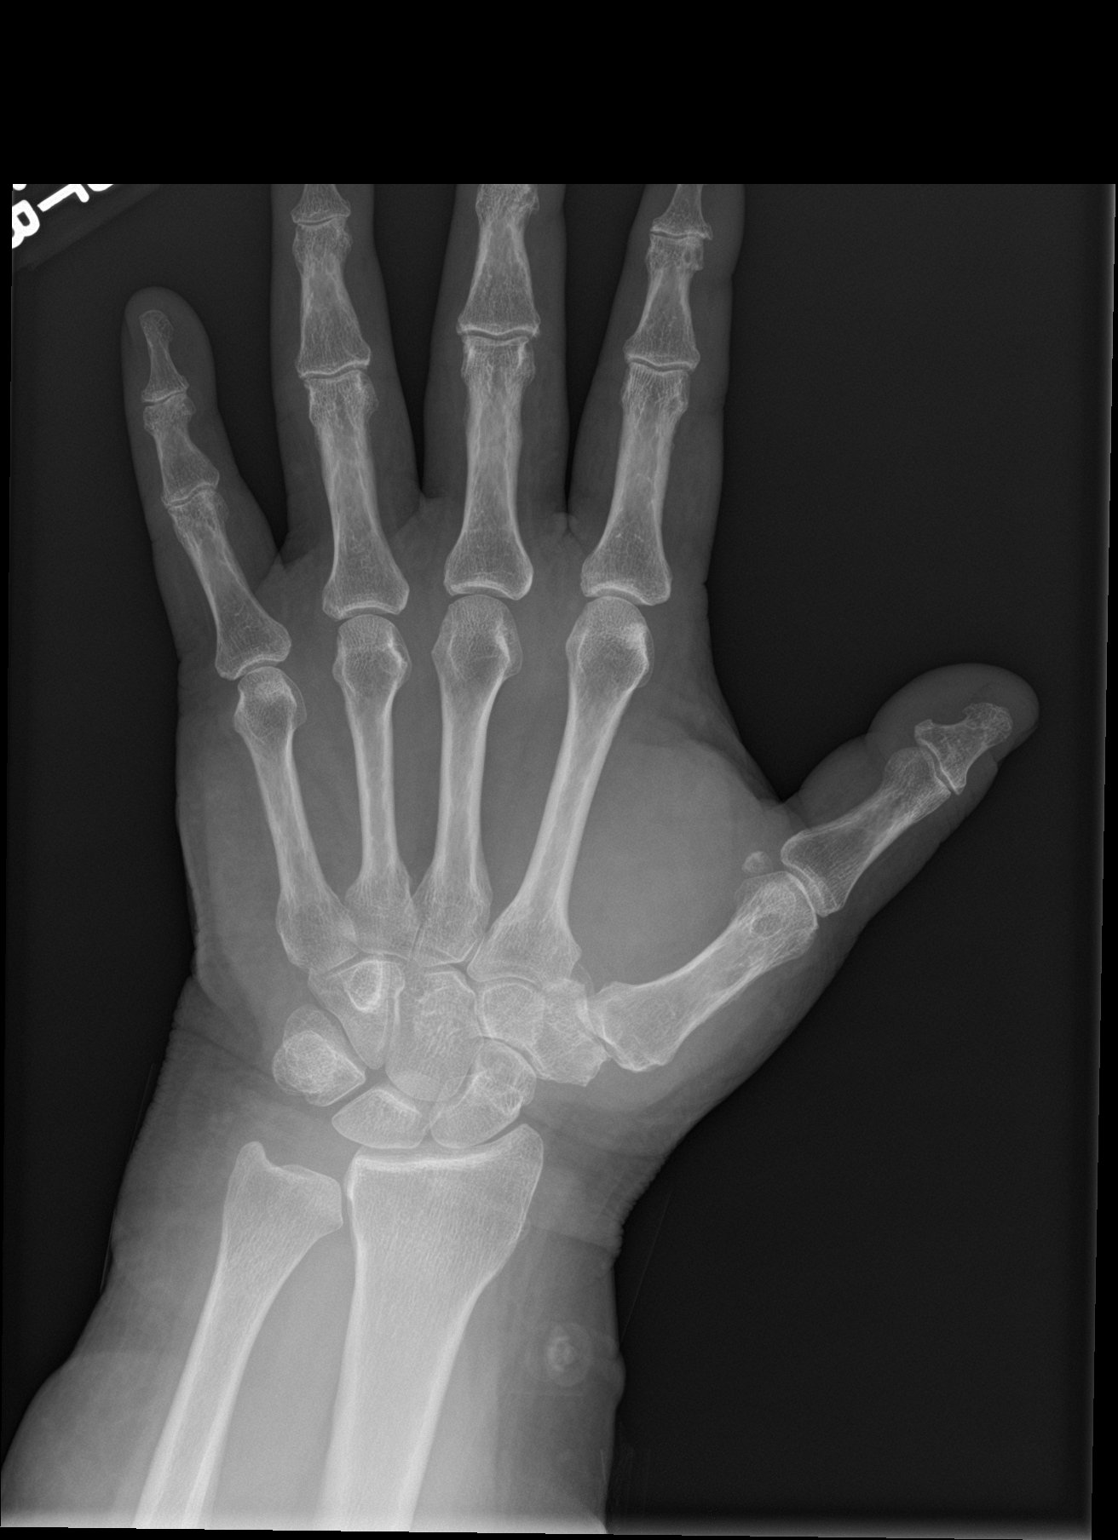

[hand obl]
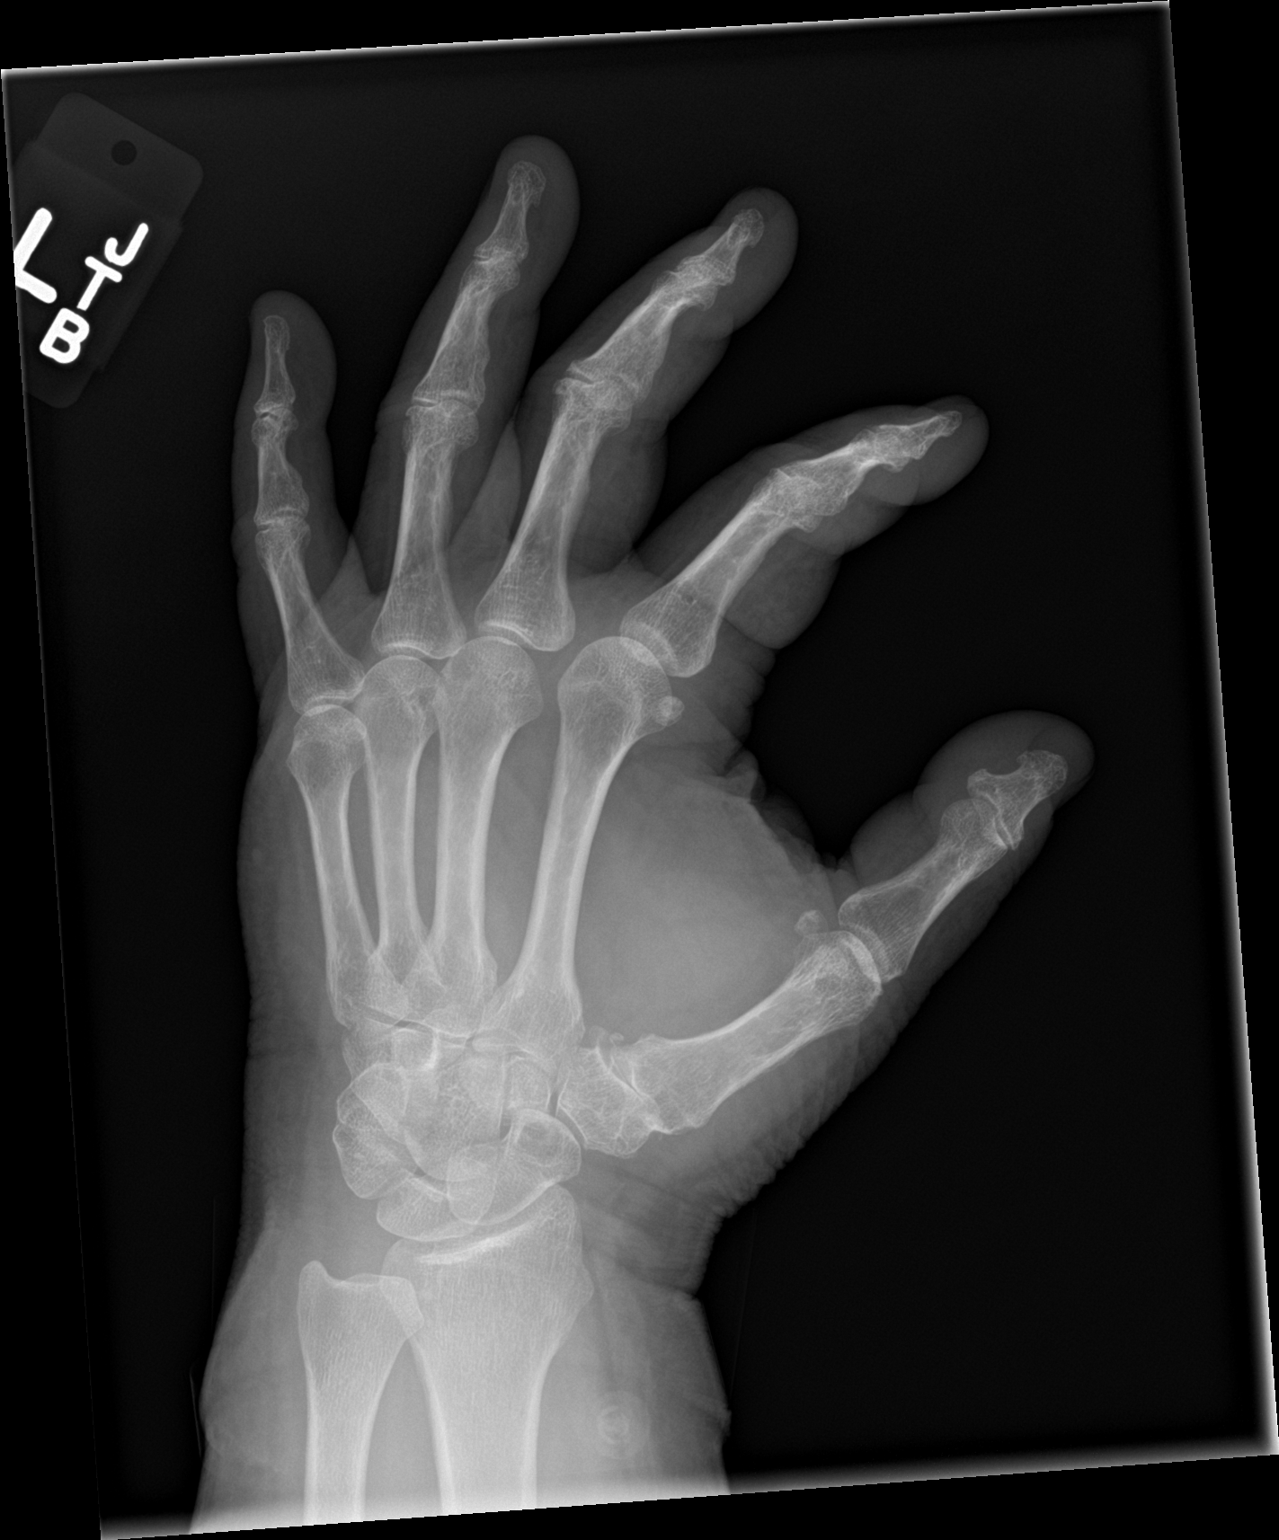

[hand lat]
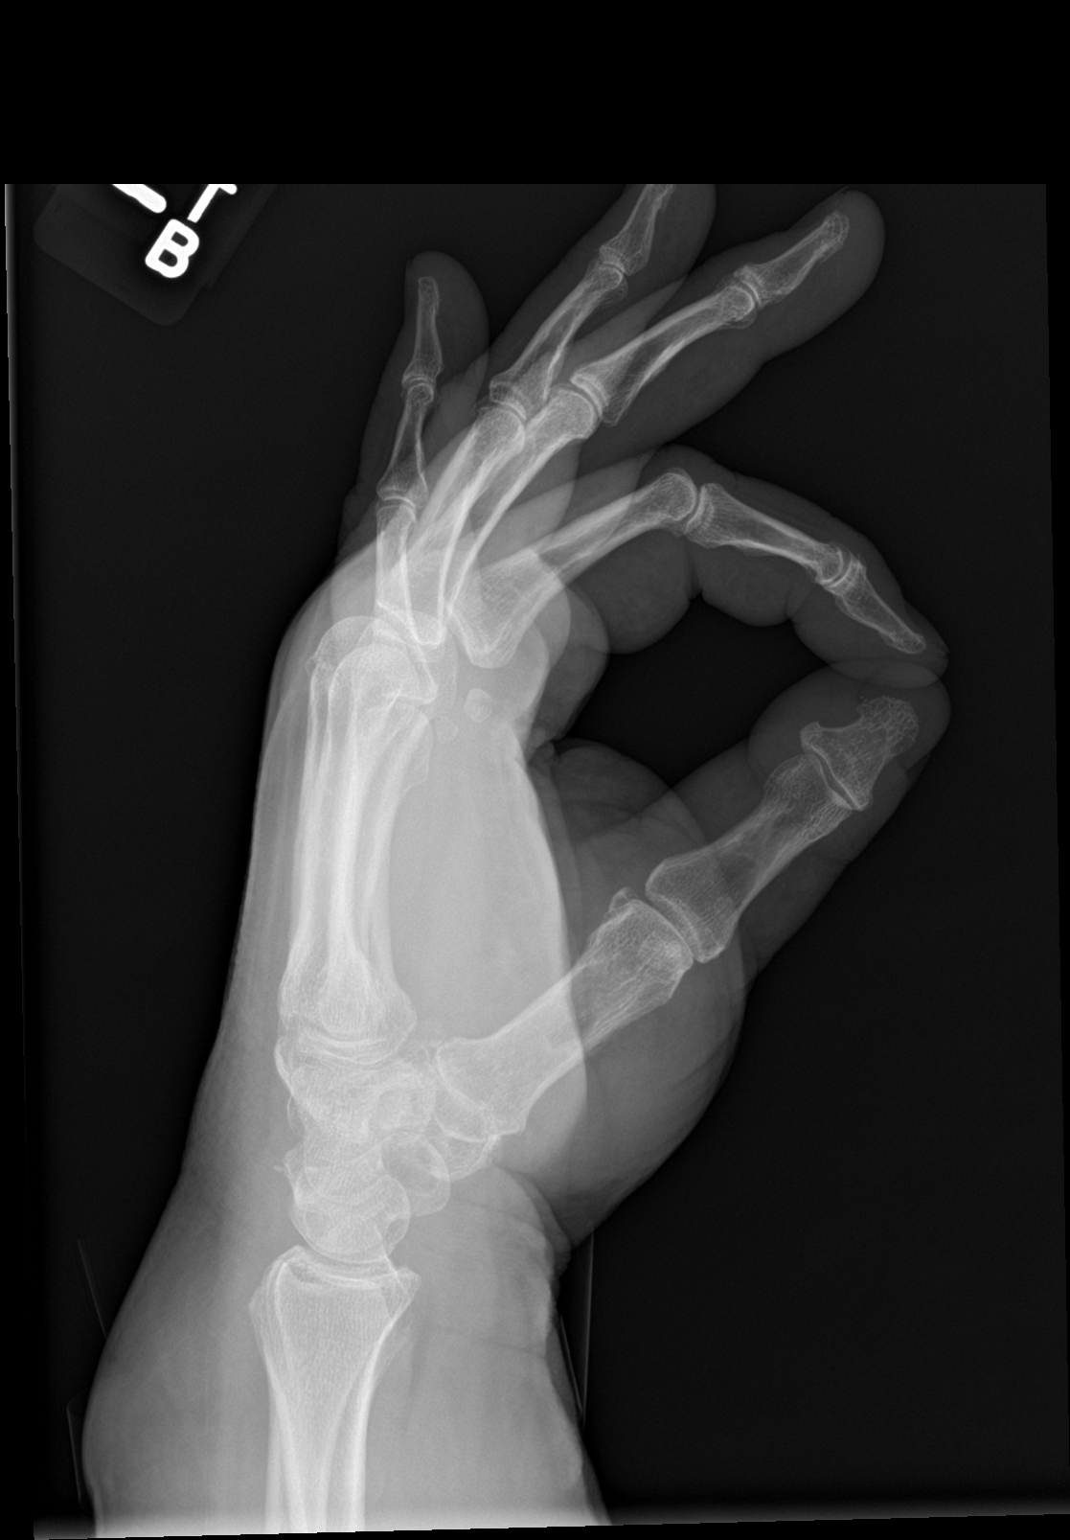

[3 of 3 positions shown; findings below may reference images not displayed]

FINDINGS: Mild-to-moderate ulnar minus variance. Mild degenerative change of
the radiocarpal joint and radial side of the carpal bones/first
carpometacarpal joints. Minimal degenerative change over the DIP
joints. Normal bone mineralization and alignment. No bony erosions.
IMPRESSION: Mild osteoarthritic change over the hand and wrist.

## 2019-06-09 NOTE — Assessment & Plan Note (Addendum)
Chronic bilateral hand stiffness worse in the morning , lasting several hours. +FH for RA. Has tried antiinflammatories without relief. Previously thought to be OA.   Concern for RA. No malar rash so doubt lupus.   - x-ray bilateral hands - RF, CCP, ENA, dsDNA - will call patient with results   ADDENDUM: antibodies negative, x-ray consistent with OA. However, morning stiffness lasting 6 hrs and family history of RA are still concerning. There are cases of seronegative RA. Discussed results with patient and recommendation for Rheumatology evaluation. She is in agreement  - referral placed to rheumatology

## 2019-06-09 NOTE — Progress Notes (Signed)
   CC: hand pain  HPI:  [name] is a 58 y.o. F presenting for chronic, progressive bilateral hand stiffness. She is obese, has a history of breast cancer s/p right lumpectomy in July 2019 now on aromatase inhibitor, and chronic pain in her low back, bilateral hips, and bilateral hands.   She reports bilateral hand stiffness worse in the morning and lasting until 1-2pm. The stiffness interferes with her ability to open containers and pick things up. She has tried Tylenol, ibuprofen/naproxen 1-2 pills qhs and most recently voltaren gel without relief. She reports a family history of RA in her brother. She also endorses shortness of breath but attributes that to her copd.   She denies hair thinning but reports "friable" skin.    Past Medical History:  Diagnosis Date  . Anemia   . Aortic valve stenosis, severe   . 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 Asthma   . Blood transfusion    2013[facility]  . Breast cancer in female [facility])    Right  . CAD (coronary artery disease)    Cath 2010 with DES x 1 RCA-- PT'S CARDIOLOGIST IS DR. [place]  . Chronic diastolic congestive heart failure ([place])   . COPD (chronic obstructive pulmonary disease) ([place])   . Depression   . Diabetes mellitus DIAGNOSED IN2010   Controll s with diet  . Eczema    on back  . Hyperlipidemia   . Hypertension   . Kidney stones   . Morbid obesity with body mass index of 60.0-69.9 in adult [facility])   . Myocardial infarction ([place])    PT THINKS SHE WAS DX WITH MI AT THE TIME OF HEART STENTING  . Pulmonary embolism ([place]) 02/08/12   S/P RT TOTAL KNEE ON 02/03/12--ON 02/08/12--DEVELOPED ACUTE SOB AND CHEST PAIN--AND DIAGNOSED WITH  PULMONARY EMBOLUS AND PNEUMONIA  . Restless leg syndrome   . Sleep apnea    uses 3 liters O2 at night   . Uterine fibroid    NO PROBLEMS AT PRESENT FROM THE FIBROIDS-STATES SHE IS  POST MENOPAUSAL-LAST MENSES 2010 EXCEPT FOR EPISODE THIS YR OF BLEEDING RELATED TO FIBROIDS.  Marland Kitchen Weakness    BOTH HANDS - S/P BILATERAL CARPAL TUNNEL RELEASE--BUT STILL HAS WEAKNESS--OFTEN DROPS THINGS    Physical Exam:  Vitals:   06/09/19 1053 06/09/19 1056  BP: 131/64   Pulse: 96   Temp:  98 F (36.7 C)  TempSrc:  Oral  SpO2: 93%   Weight: (!) 312 lb 4.8 oz (141.7 kg)   Height: 5' (1.524 m)    Gen: well appearing obese female, pleasant  MSK: bilateral hands without synovitis, sausage digits, or nodules. Bilateral MCP joints are TTP, left 3rd and 4th finger with catching of the flexor tendon. Right shoulder with full ROM, numbness from the right shoulder to right hand is provoked by straight arm raise.  Neuro: unable to make fist with left hand, right hand grip strength 5/5, bilateral upper extremity strength 5/5, sensation currently intact   Assessment & Plan:   See Encounters Tab for problem based charting.  Patient seen with Dr. Evette Doffing

## 2019-06-09 NOTE — Patient Instructions (Signed)
It was nice seeing you today. Thank you for choosing [facility] for your Primary Care.   Today we talked about:  1) Hand stiffness: I'm concerned you may have rheumatoid arthritis or some other kind of inflammatory arthritis. Given your family history, we are doing a broad work up by checking your blood for different anti-bodies and getting hand x-rays. I will call you in a few days when I have all the results.    FOLLOW-UP INSTRUCTIONS When: 2 weeks For: f/u hand stiffness What to bring:   Please contact the clinic if you have any problems, or need to be seen sooner.    Rheumatoid Arthritis Rheumatoid arthritis (RA) is a long-term (chronic) disease. RA causes inflammation in your joints. Your joints may feel painful, stiff, swollen, and warm. RA may start slowly. It most often affects the small joints of the hands and feet. It can also affect other parts of the body. Symptoms of RA often come and go. There is no cure for RA, but medicines can help your symptoms. What are the causes?  RA is an autoimmune disease. This means that your body's defense system (immune system) attacks healthy parts of your body by mistake. The exact cause of RA is not known. What increases the risk?  Being a woman.  Having a family history of RA or other diseases like RA.  Smoking.  Being overweight.  Being exposed to pollutants or chemicals. What are the signs or symptoms?  Morning stiffness that lasts longer than 30 minutes. This is often the first symptom.  Symptoms start slowly. They are often worse in the morning.  As RA gets worse, symptoms may include: ? Pain, stiffness, swelling, warmth, and tenderness in joints on both sides of your body. ? Loss of energy. ? Not feeling hungry. ? Weight loss. ? A low fever. ? Dry eyes and a dry mouth. ? Firm lumps that grow under your skin. ? Changes in the way your joints look. ? Changes in the way your joints work.  Symptoms vary and  they: ? Often come and go. ? Sometimes get worse for a period of time. These are called flares. How is this treated?   Treatment may include: ? Taking good care of yourself. Be sure to rest as needed, eat a healthy diet, and exercise. ? Medicines. These may include:  Pain relievers.  Medicines to help with inflammation.  Disease-modifying antirheumatic drugs (DMARDs).  Medicines called biologic response modifiers. ? Physical therapy and occupational therapy. ? Surgery, if joint damage is very bad. Your doctor will work with you to find the best treatments. Follow these instructions at home: Activity  Return to your normal activities as told by your doctor. Ask your doctor what activities are safe for you.  Rest when you have a flare.  Exercise as told by your doctor. General instructions  Take over-the-counter and prescription medicines only as told by your doctor.  Keep all follow-up visits as told by your doctor. This is important. Where to find more information  [employer] of Rheumatology: www.rheumatology.[place]: www.arthritis.org Contact a doctor if:  You have a flare.  You have a fever.  You have problems because of your medicines. Get help right away if:  You have chest pain.  You have trouble breathing.  You get a hot, painful joint all of a sudden, and it is worse than your normal joint aches. Summary  RA is a long-term disease.  Symptoms of RA  start slowly. They are often worse in the morning.  RA causes inflammation in your joints. This information is not intended to replace advice given to you by your health care provider. Make sure you discuss any questions you have with your health care provider. Document Released: 03/08/2012 Document Revised: 08/18/2018 Document Reviewed: 08/18/2018 Elsevier Interactive Patient Education  2019 [employer].

## 2019-06-09 NOTE — Assessment & Plan Note (Signed)
Chronic, intermittent, lasting 2-3 minutes. Most consistent with cervical radiculopathy which would correlate with her obesity. History of breast cancer is concerning for bone mets but arm pain/numnbess seem to predate cancer diagnosis/treatment.   - continue weight loss - could benefit from c-spine imaging if symptoms worsen

## 2019-06-10 LAB — RHEUMATOID FACTOR: Rheumatoid fact SerPl-aCnc: 12 IU/mL (ref 0.0–13.9)

## 2019-06-10 NOTE — Progress Notes (Signed)
Internal Medicine Clinic Attending  I saw and evaluated the patient.  I personally confirmed the key portions of the history and exam documented by Dr. Donne Hazel and I reviewed pertinent patient test results.  The assessment, diagnosis, and plan were formulated together and I agree with the documentation in the resident's note.  Function limiting bilateral hand pain and stiffness, worse in the morning and lasts 6 hours into the afternoon. On exam no active synovitis or dactylitis, no joint effusions. Given the prolonged stiffness, positive family history for RA in brother and mother, we will rule out inflammatory arthritis with serologies and hand xrays. If those are fine, then this is probably osteoarthritis.

## 2019-06-11 LAB — ANA+ENA+DNA/DS+ANTICH+CENTR
ANA Titer 1: NEGATIVE
Anti JO-1: <0.2 AI (ref 0.0–0.9)
Centromere Ab Screen: <0.2 AI (ref 0.0–0.9)
Chromatin Ab SerPl-aCnc: <0.2 AI (ref 0.0–0.9)
ENA RNP Ab: <0.2 AI (ref 0.0–0.9)
ENA SM Ab Ser-aCnc: <0.2 AI (ref 0.0–0.9)
ENA SSA (RO) Ab: <0.2 AI (ref 0.0–0.9)
ENA SSB (LA) Ab: <0.2 AI (ref 0.0–0.9)
Scleroderma (Scl-70) (ENA) Antibody, IgG: <0.2 AI (ref 0.0–0.9)
dsDNA Ab: 1 IU/mL (ref 0–9)

## 2019-06-11 LAB — CYCLIC CITRUL PEPTIDE ANTIBODY, IGG/IGA: Cyclic Citrullin Peptide Ab: 4 units (ref 0–19)

## 2019-06-15 ENCOUNTER — Telehealth: Payer: Self-pay

## 2019-06-15 NOTE — Telephone Encounter (Signed)
Pt seen in [facility] on 06/09/19 by DrVogel (who is currently out of the office). Pt now calling for lab and xray (hand) results.  Will have attending md assist with this matter.Despina Hidden Cassady6/17/202012:02 PM

## 2019-06-15 NOTE — Telephone Encounter (Signed)
Requesting lab and xray results. Please call pt back.  

## 2019-06-15 NOTE — Telephone Encounter (Signed)
Please let her know that the labs and the xrays show NO signs of Rheumatoid arthritis however she does have osteoarthritis.

## 2019-06-16 NOTE — Telephone Encounter (Signed)
Pt informed of results but would like to know what to do about the pain and swelling-has tried motrin, voltaren gel, naproxen, and soaking in epsom salt.  States she wakes up every morning to pain and swelling in hands. Please advise.Despina Hidden Cassady6/18/20202:54 PM

## 2019-06-22 ENCOUNTER — Other Ambulatory Visit: Payer: Self-pay | Admitting: Internal Medicine

## 2019-06-22 NOTE — Telephone Encounter (Signed)
Called patient to further discuss hand pain, lab results and xrays. Even though her antibodies were negative and the x-ray is consistent with OA, we opted to refer her to rheumatology for evaluation of seronegative RA given her family history and several hours of morning hand stiffness. She was also instructed to schedule an appt with her new PCP next month

## 2019-06-22 NOTE — Addendum Note (Signed)
Addended by: Isabelle Course on: 06/22/2019 11:50 AM   Modules accepted: Orders

## 2019-06-27 ENCOUNTER — Telehealth: Payer: Self-pay

## 2019-06-27 ENCOUNTER — Encounter: Payer: Self-pay | Admitting: *Deleted

## 2019-06-27 NOTE — Telephone Encounter (Signed)
Requesting to speak with a nurse about getting oxygen mask. Please call pt back.

## 2019-06-27 NOTE — Telephone Encounter (Signed)
I spoke to patient about oxygen mask instead of nasal cannula because it hurt her nose,i told patient I would get [facility],to see what we would need to help with this problem Butters, Joseph Bias C6/29/20202:04 PM

## 2019-06-29 NOTE — Telephone Encounter (Signed)
RE: nasal cannual problem Received: [employer]  [place], [place] there!  Our respiratory team is sending some pediatric nasal cannulas out to this patient.   Thank you for reaching out!  Melissa   Previous Messages  ----- Message -----  From: Darlina Guys  Sent: 06/27/2019  5:17 PM EDT  To: Darlina Guys, Elon Alas, *  Subject: RE: nasal cannual problem             Let me check with our Respiratory manager. I know that they have suggested things in the past. I'll get back to you Jazmyn Offner.  Thanks  Melissa   ----- Message -----  From: Mariann Barter  Sent: 06/27/2019  2:18 PM EDT  To: Darlina Guys, Elon Alas, *  Subject: RE: nasal cannual problem             I am going to have to refer this one to [facility]. I am not sure what can be done in regards to the nasal probes.   Melissa do you have any ideas?   ----- Message -----  From: Judge Stall, [place]  Sent: 06/27/2019  2:04 PM EDT  To: Darlina Guys, Elon Alas, *  Subject: nasal cannual problem               Patient said the nasal probes for her oxygen is making her nose sore,what can we do for this,or what kind of order do we need to put in [place], [place] C6/29/20202:11 PM

## 2019-07-04 ENCOUNTER — Telehealth: Payer: Self-pay | Admitting: *Deleted

## 2019-07-04 NOTE — Telephone Encounter (Signed)
SPOKE WITH MS Zumwalt REGARDING HER RHEUMATOLOGY REFERRAL. OFFICE TO CONTACT PATIENT WHEN THEY GET  TO HER IN THEIR WORK Q.

## 2019-07-05 ENCOUNTER — Encounter: Payer: Self-pay | Admitting: *Deleted

## 2019-07-11 ENCOUNTER — Telehealth: Payer: Self-pay | Admitting: Internal Medicine

## 2019-07-11 DIAGNOSIS — J441 Chronic obstructive pulmonary disease with (acute) exacerbation: Secondary | ICD-10-CM

## 2019-07-11 NOTE — Telephone Encounter (Signed)
Pt is requesting a nurse call [phone]

## 2019-07-11 NOTE — Telephone Encounter (Signed)
Returned call to patient. States [facility] told her we denied refill on Proventil. There was no rx refill request. Will send request now. Hubbard Hartshorn, RN, BSN

## 2019-07-13 ENCOUNTER — Other Ambulatory Visit: Payer: Self-pay | Admitting: *Deleted

## 2019-07-13 DIAGNOSIS — C50411 Malignant neoplasm of upper-outer quadrant of right female breast: Secondary | ICD-10-CM

## 2019-07-14 ENCOUNTER — Inpatient Hospital Stay (HOSPITAL_BASED_OUTPATIENT_CLINIC_OR_DEPARTMENT_OTHER): Payer: Medicaid Other | Admitting: Oncology

## 2019-07-14 ENCOUNTER — Other Ambulatory Visit: Payer: Self-pay

## 2019-07-14 ENCOUNTER — Inpatient Hospital Stay: Payer: Medicaid Other | Attending: Oncology

## 2019-07-14 VITALS — BP 157/64 | HR 90 | Temp 98.9°F | Resp 17 | Ht 60.0 in | Wt 302.6 lb

## 2019-07-14 DIAGNOSIS — Z170 Estrogen receptor positive status [ER+]: Secondary | ICD-10-CM | POA: Diagnosis not present

## 2019-07-14 DIAGNOSIS — D649 Anemia, unspecified: Secondary | ICD-10-CM | POA: Diagnosis not present

## 2019-07-14 DIAGNOSIS — C50411 Malignant neoplasm of upper-outer quadrant of right female breast: Secondary | ICD-10-CM | POA: Diagnosis not present

## 2019-07-14 DIAGNOSIS — G4733 Obstructive sleep apnea (adult) (pediatric): Secondary | ICD-10-CM

## 2019-07-14 DIAGNOSIS — E119 Type 2 diabetes mellitus without complications: Secondary | ICD-10-CM

## 2019-07-14 DIAGNOSIS — Z1239 Encounter for other screening for malignant neoplasm of breast: Secondary | ICD-10-CM

## 2019-07-14 LAB — CMP (CANCER CENTER ONLY)
ALT: 13 U/L (ref 0–44)
AST: 14 U/L — ABNORMAL LOW (ref 15–41)
Albumin: 3.5 g/dL (ref 3.5–5.0)
Alkaline Phosphatase: 78 U/L (ref 38–126)
Anion gap: 11 (ref 5–15)
BUN: 12 mg/dL (ref 6–20)
CO2: 26 mmol/L (ref 22–32)
Calcium: 9.5 mg/dL (ref 8.9–10.3)
Chloride: 100 mmol/L (ref 98–111)
Creatinine: 0.84 mg/dL (ref 0.44–1.00)
GFR, Est AFR Am: >60 mL/min (ref >60)
GFR, Estimated: >60 mL/min (ref >60)
Glucose, Bld: 137 mg/dL — ABNORMAL HIGH (ref 70–99)
Potassium: 4.1 mmol/L (ref 3.5–5.1)
Sodium: 137 mmol/L (ref 135–145)
Total Bilirubin: 1.3 mg/dL — ABNORMAL HIGH (ref 0.3–1.2)
Total Protein: 7.4 g/dL (ref 6.5–8.1)

## 2019-07-14 LAB — CBC WITH DIFFERENTIAL (CANCER CENTER ONLY)
Abs Immature Granulocytes: 0.04 10*3/uL (ref 0.00–0.07)
Basophils Absolute: 0.1 10*3/uL (ref 0.0–0.1)
Basophils Relative: 1 %
Eosinophils Absolute: 0.5 10*3/uL (ref 0.0–0.5)
Eosinophils Relative: 6 %
HCT: 40.5 % (ref 36.0–46.0)
Hemoglobin: 13.1 g/dL (ref 12.0–15.0)
Immature Granulocytes: 0 %
Lymphocytes Relative: 19 %
Lymphs Abs: 1.8 10*3/uL (ref 0.7–4.0)
MCH: 26.6 pg (ref 26.0–34.0)
MCHC: 32.3 g/dL (ref 30.0–36.0)
MCV: 82.2 fL (ref 80.0–100.0)
Monocytes Absolute: 0.6 10*3/uL (ref 0.1–1.0)
Monocytes Relative: 7 %
Neutro Abs: 6.4 10*3/uL (ref 1.7–7.7)
Neutrophils Relative %: 67 %
Platelet Count: 260 10*3/uL (ref 150–400)
RBC: 4.93 MIL/uL (ref 3.87–5.11)
RDW: 13.9 % (ref 11.5–15.5)
WBC Count: 9.4 10*3/uL (ref 4.0–10.5)
nRBC: 0 % (ref 0.0–0.2)

## 2019-07-14 MED ORDER — ALBUTEROL SULFATE HFA 108 (90 BASE) MCG/ACT IN AERS: INHALATION_SPRAY | RESPIRATORY_TRACT | 2 refills | Status: DC

## 2019-07-14 MED ORDER — ALBUTEROL SULFATE (2.5 MG/3ML) 0.083% IN NEBU: 2.5000 mg | INHALATION_SOLUTION | RESPIRATORY_TRACT | 1 refills | Status: DC | PRN

## 2019-07-14 MED ORDER — EXEMESTANE 25 MG PO TABS: 25.0000 mg | ORAL_TABLET | Freq: Every day | ORAL | 12 refills | Status: DC

## 2019-07-14 NOTE — Progress Notes (Signed)
[name]  [phone] [phone]   ID: [name] DOB: Apr 16, 1961  MR#: [id number]  YPP#:[id number]  Patient Care Team: Earlene Plater, MD as PCP - General Angelena Form Annita Brod, MD as PCP - Cardiology (Cardiology) Alphonsa Overall, MD as Consulting Physician (General Surgery) Elaisha Zahniser, Virgie Dad, MD as Consulting Physician (Oncology) Kyung Rudd, MD as Consulting Physician (Radiation Oncology) Burnell Blanks, MD as Consulting Physician (Cardiology) Delice Bison, Charlestine Massed, NP as Nurse Practitioner (Hematology and Oncology) OTHER MD:   CHIEF COMPLAINT: Estrogen receptor positive breast cancer  CURRENT TREATMENT: Exemestane   INTERVAL HISTORY: [name] returns today for follow-up and treatment of her estrogen receptor positive breast cancer.   She continues on exemestane with much better tolerance.  She does have some hot flashes but they are not a major issue.  She has vaginal dryness.  She is currently not using any emollients.  Since her last visit, she presented with bilateral hand pain and stiffness. She proceeded to undergo x-ray of both hands, each of which showed osteoarthritis.  Her most recent mammogram was a right diagnostic on 06/03/2019, and a screening bilateral on 05/27/2018, both at [place]. An order was placed at her last visit, but this does not appear to have been scheduled.  REVIEW OF SYSTEMS: [name] does some shopping, housework, and cooking, and she drives some of her housemates and husband around.  Otherwise she is very careful regarding the pandemic and she is aware that she is very high risk.  She does not exercise regularly.  She has managed to lose about 50 pounds with her new diabetic medications.  A detailed review of systems is otherwise stable  HISTORY OF CURRENT ILLNESS: On the original intake note:  [name] had routine screening mammography on 05/27/2018 showing a  possible abnormality in the right breast. She underwent unilateral right  diagnostic mammography with tomography and right breast ultrasonography at [facility] on 06/02/2018 showing: breast density category B. An area of distortion is identified in the upper outer quadrant of the right breast mammographically. Ultrasonography of this area found an irregular hypoechoic mass at the 11 o'clock upper outer quadrant measuring 1.8 x 1.8 x 1.4 cm and located 6 cm from the nipple. The right axilla is negative sonographically for abnormal lymph nodes.   Accordingly on 06/04/2018 she proceeded to biopsy of the right breast area in question. The pathology from this procedure showed ([id number]): Invasive ductal carcinoma grade 1 measuring 1.0 cm in greatest extent, with microcalcifications. Ductal carcinoma in situ high grade. Prognostic indicators significant for: estrogen receptor, 100% positive and progesterone receptor, 100% positive, both with strong staining intensity. Proliferation marker Ki67 at 8%. HER2  not amplified with ratios HER2/CEP17 signals 1.32 and average HER2 copies per cell 1.85  The patient's subsequent history is as detailed below.   PAST MEDICAL HISTORY: Past Medical History:  Diagnosis Date  . Anemia   . Aortic valve stenosis, severe   . 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 Asthma   . Blood transfusion    [facility]  . Breast cancer in female [facility])    Right  . CAD (coronary artery disease)    Cath 2010 with DES x 1 RCA-- PT'S CARDIOLOGIST IS DR. [place]  . Chronic diastolic congestive heart failure ([place])   . COPD (chronic obstructive pulmonary  disease) ([place])   . Depression   . Diabetes mellitus DIAGNOSED IN2010   Controll s with diet  . Eczema    on back  . Hyperlipidemia   . Hypertension   . Kidney stones   . Morbid obesity with body mass index of  60.0-69.9 in adult [facility])   . Myocardial infarction ([place])    PT THINKS SHE WAS DX WITH MI AT THE TIME OF HEART STENTING  . Pulmonary embolism ([place]) 02/08/12   S/P RT TOTAL KNEE ON 02/03/12--ON 02/08/12--DEVELOPED ACUTE SOB AND CHEST PAIN--AND DIAGNOSED WITH  PULMONARY EMBOLUS AND PNEUMONIA  . Restless leg syndrome   . Sleep apnea    uses 3 liters O2 at night   . Uterine fibroid    NO PROBLEMS AT PRESENT FROM THE FIBROIDS-STATES SHE IS POST MENOPAUSAL-LAST MENSES 2010 EXCEPT FOR EPISODE THIS YR OF BLEEDING RELATED TO FIBROIDS.  Marland Kitchen Weakness    BOTH HANDS - S/P BILATERAL CARPAL TUNNEL RELEASE--BUT STILL HAS WEAKNESS--OFTEN DROPS THINGS  Migraines at younger age. MI in 2009   PAST SURGICAL HISTORY: Past Surgical History:  Procedure Laterality Date  . BREAST LUMPECTOMY WITH RADIOACTIVE SEED AND SENTINEL LYMPH NODE BIOPSY Right 07/19/2018   Procedure: BREAST LUMPECTOMY WITH RADIOACTIVE SEED AND SENTINEL LYMPH NODE BIOPSY;  Surgeon: Alphonsa Overall, MD;  Location: [place];  Service: General;  Laterality: Right;  . CARDIAC CATHETERIZATION    . CARPAL TUNNEL RELEASE     Bilateral  . CHOLECYSTECTOMY    . CORONARY ANGIOPLASTY     2010 has stent in place  . CYSTOSCOPY W/ RETROGRADES Right 09/21/2013   Procedure: CYSTOSCOPY WITH RIGHT RETROGRADE PYELOGRAM RIGHT DOUBLE J STENT ;  Surgeon: Fredricka Bonine, MD;  [facility];  Service: Urology;  Laterality: Right;  . CYSTOSCOPY WITH URETEROSCOPY AND STENT PLACEMENT Right 10/25/2013   Procedure: CYSTOSCOPY RIGHT URETEROSCOPY HOLMIUM LASER LITHO AND STENT PLACEMENT;  Surgeon: Fredricka Bonine, MD;  Location[facility];  Service: Urology;  Laterality: Right;  . HERNIA REPAIR    . INTRAOPERATIVE TRANSESOPHAGEAL ECHOCARDIOGRAM N/A 12/12/2014   Procedure: INTRAOPERATIVE TRANSESOPHAGEAL ECHOCARDIOGRAM;  Surgeon: Burnell Blanks, MD;  Location[facility];  Service: Cardiovascular;  Laterality: N/A;  . JOINT REPLACEMENT     bil total knees  . KNEE  ARTHROPLASTY  02/03/2012   Procedure: COMPUTER ASSISTED TOTAL KNEE ARTHROPLASTY;  Surgeon: Mcarthur Rossetti, MD;  Location: [place];  Service: Orthopedics;  Laterality: Right;  Right total knee arthroplasty  . LEFT AND RIGHT HEART CATHETERIZATION WITH CORONARY ANGIOGRAM N/A 03/17/2013   Procedure: LEFT AND RIGHT HEART CATHETERIZATION WITH CORONARY ANGIOGRAM;  Surgeon: Burnell Blanks, MD;  Location: [facility] CATH LAB;  Service: Cardiovascular;  Laterality: N/A;  . LEFT AND RIGHT HEART CATHETERIZATION WITH CORONARY/GRAFT ANGIOGRAM N/A 09/14/2014   Procedure: LEFT AND RIGHT HEART CATHETERIZATION WITH Beatrix Fetters;  Surgeon: Burnell Blanks, MD;  Location: [facility] CATH LAB;  Service: Cardiovascular;  Laterality: N/A;  . TEE WITHOUT CARDIOVERSION N/A 03/14/2013   Procedure: TRANSESOPHAGEAL ECHOCARDIOGRAM (TEE);  Surgeon: Lelon Perla, MD;  [facility]COPY;  Service: Cardiovascular;  Laterality: N/A;  . TEE WITHOUT CARDIOVERSION N/A 11/14/2014   Procedure: TRANSESOPHAGEAL ECHOCARDIOGRAM (TEE);  Surgeon: Lelon Perla, MD;  Location: [facility] ENDOSCOPY;  Service: Cardiovascular;  Laterality: N/A;  . TOTAL KNEE ARTHROPLASTY  09/10/2012   Procedure: TOTAL KNEE ARTHROPLASTY;  Surgeon: Mcarthur Rossetti, MD;  [facility];  Service: Orthopedics;  Laterality: Left;  . TOTAL KNEE REVISION Right 07/15/2013  Procedure: REVISION ARTHROPLASTY RIGHT KNEE;  Surgeon: Mcarthur Rossetti, MD;  [facility];  Service: Orthopedics;  Laterality: Right;  . TRANSCATHETER AORTIC VALVE REPLACEMENT, TRANSFEMORAL N/A 12/12/2014   Procedure: TRANSCATHETER AORTIC VALVE REPLACEMENT, TRANSFEMORAL;  Surgeon: Burnell Blanks, MD;  Location: [place];  Service: Cardiovascular;  Laterality: N/A;  . TRIGGER FINGER RELEASE  09/10/2012   Procedure: RELEASE TRIGGER FINGER/A-1 PULLEY;  Surgeon: Mcarthur Rossetti, MD;  [facility];  Service: Orthopedics;  Laterality: Right;  Right Ring Finger   . TUBAL LIGATION    [place] Mitral Aortic valve surgery 2015   FAMILY HISTORY Family History  Problem Relation Age of Onset  . Breast cancer Mother        stage IV at diagnosis  . Emphysema Mother        smoked  . Heart disease Mother   . COPD Father        smoked  . Asthma Father   . Heart disease Father   . Cancer Brother        Sinus  The patient's father died at age 8 due to emphysema and possibly cancer . The patient's mother died at age 100 due to breast cancer with stage IV disease at presentation. The patient has 2 full and 1 half brothers and 1 sister.  One of the patient's brothers had nasopharyngeal cancer stage IV diagnosed at age 72. There was also a paternal great grandfather with prostate cancer. The patient denies a family history of ovarian cancer.    GYNECOLOGIC HISTORY:  Patient's last menstrual period was 02/03/2012. Menarche: 58 years old Age at first live birth: 58 years old She is [place] P2. Her LMP was around age 1. She used oral contraception in her 26'Z with no complications.    SOCIAL HISTORY:  [name] is disabled. Her husband, Arnette Norris is retired from working in Health and safety inspector from [employer], Estate agent, and smoke damage. The patient's son, Christia Reading 70 lives in [place] and works as a Biomedical scientist. The patient's son Legrand Como 30 lives in [name] and is a Environmental education officer of a [employer].  Aside from the patient and her husband, there is another couple and another unrelated person living in their home.  The patient has no grandchildren. She attends [employer].     ADVANCED DIRECTIVES: not in place   HEALTH MAINTENANCE: Social History   Tobacco Use  . Smoking status: Former Smoker    Packs/day: 1.50    Years: 30.00    Pack years: 45.00    Types: Cigarettes    Quit date: 12/29/2000    Years since quitting: 18.5  . Smokeless tobacco: Never Used  Substance Use Topics  . Alcohol use: Yes    Alcohol/week: 1.0 standard drinks    Types: 1  Cans of beer per week    Frequency: Never    Comment: rarely  . Drug use: No     Colonoscopy: about 8 year ago  PAP: 10/20/2016 normal   Bone density: no   No Known Allergies  Current Outpatient Medications  Medication Sig Dispense Refill  . Accu-Chek FastClix Lancets MISC Check blood sugar two times a day 102 each 9  . albuterol (PROVENTIL HFA) 108 (90 Base) MCG/ACT inhaler INHALE 2 PUFFS BY MOUTH EVERY 4 HOURS AS NEEDED FOR COUGHING, WHEEZING, OR SHORTNESS OF BREATH 13.4 g 2  . albuterol (PROVENTIL) (2.5 MG/3ML) 0.083% nebulizer solution Take 3 mLs (2.5 mg total) by nebulization every 4 (four) hours as needed  for wheezing or shortness of breath. 180 mL 1  . Alirocumab (PRALUENT) 150 MG/ML SOPN Inject 150 mg into the skin every 14 (fourteen) days. 2 pen 11  . ASPIRIN ADULT LOW STRENGTH 81 MG EC tablet TAKE 1 Tablet BY MOUTH ONCE DAILY 90 tablet 3  . atorvastatin (LIPITOR) 80 MG tablet Take 1 tablet (80 mg total) by mouth daily. 90 tablet 1  . Blood Glucose Monitoring Suppl (ACCU-CHEK GUIDE) w/Device KIT 1 each by Does not apply route 2 (two) times daily. 1 kit 1  . clopidogrel (PLAVIX) 75 MG tablet Take 1 tablet (75 mg total) by mouth every morning. 90 tablet 1  . diclofenac sodium (VOLTAREN) 1 % GEL Apply 4 g topically 4 (four) times daily. 300 g 2  . DULoxetine (CYMBALTA) 30 MG capsule Take 1 capsule (30 mg total) by mouth 2 (two) times daily. 180 capsule 1  . exemestane (AROMASIN) 25 MG tablet Take 1 tablet (25 mg total) by mouth daily after breakfast. 90 tablet 12  . ezetimibe (ZETIA) 10 MG tablet Take 1 tablet (10 mg total) by mouth daily. 90 tablet 1  . Fluticasone-Umeclidin-Vilant (TRELEGY ELLIPTA) 100-62.5-25 MCG/INH AEPB Inhale 1 puff into the lungs daily. 3 each 1  . furosemide (LASIX) 40 MG tablet Take 1 tablet (40 mg total) by mouth 2 (two) times daily. 180 tablet 1  . gabapentin (NEURONTIN) 300 MG capsule TAKE 3 CAPSULES BY MOUTH AT BEDTIME 90 capsule 0  . glucose blood  (ACCU-CHEK GUIDE) test strip Check blood sugar 2 times per day 100 each 9  . hydrochlorothiazide (HYDRODIURIL) 25 MG tablet Take 1 tablet (25 mg total) by mouth daily. 90 tablet 1  . ibuprofen (ADVIL,MOTRIN) 200 MG tablet Take 400 mg by mouth every 6 (six) hours as needed for moderate pain.    . Insulin Pen Needle (PEN NEEDLES) 32G X 4 MM MISC 1.8 mg by Does not apply route daily. 300 each 2  . liraglutide (VICTOZA) 18 MG/3ML SOPN Inject 0.1 mLs (0.6 mg total) into the skin daily for 7 days, THEN 0.2 mLs (1.2 mg total) daily. 6.7 mL 5  . liraglutide (VICTOZA) 18 MG/3ML SOPN Inject 0.3 mLs (1.8 mg total) into the skin daily. 5 pen 5  . losartan (COZAAR) 100 MG tablet Take 1 tablet (100 mg total) by mouth daily. 90 tablet 1  . metFORMIN (GLUCOPHAGE XR) 500 MG 24 hr tablet Take 2 tablets (1,000 mg total) by mouth 2 (two) times daily. 120 tablet 6  . metoprolol tartrate (LOPRESSOR) 25 MG tablet Take 1 tablet (25 mg total) by mouth 2 (two) times daily. 180 tablet 1  . nitroGLYCERIN (NITROSTAT) 0.4 MG SL tablet Place 1 tablet (0.4 mg total) under the tongue every 5 (five) minutes as needed for chest pain. 100 tablet 0  . OXYGEN Inhale 3 L into the lungs daily.    . potassium chloride SA (K-DUR,KLOR-CON) 20 MEQ tablet Take 2 tablets (40 mEq total) by mouth daily. 180 tablet 1   No current facility-administered medications for this visit.     OBJECTIVE: Morbidly obese white woman in no acute distress  Vitals:   07/14/19 1141  BP: (!) 157/64  Pulse: 90  Resp: 17  Temp: 98.9 F (37.2 C)  SpO2: 95%     Body mass index is 59.1 kg/m.   Wt Readings from Last 3 Encounters:  07/14/19 (!) 302 lb 9.6 oz (137.3 kg)  06/09/19 (!) 312 lb 4.8 oz (141.7 kg)  04/18/19 (!) 320  lb (145.2 kg)      ECOG FS:1 - Symptomatic but completely ambulatory  Sclerae unicteric, pupils round and equal Wearing a mask No cervical or supraclavicular adenopathy Lungs no rales or rhonchi Heart regular rate and rhythm  Abd soft, obese, nontender, positive bowel sounds MSK no focal spinal tenderness, no upper extremity lymphedema Neuro: nonfocal, well oriented, appropriate affect Breasts: The right breast is status post lumpectomy and radiation, with no evidence of local recurrence.  The left breast is benign.  Both axillae are benign.   LAB RESULTS:  CMP     Component Value Date/Time   NA 137 01/19/2019 1133   K 3.9 01/19/2019 1133   CL 98 01/19/2019 1133   CO2 24 01/19/2019 1133   GLUCOSE 149 (H) 01/19/2019 1133   BUN 14 01/19/2019 1133   CREATININE 0.84 01/19/2019 1133   CREATININE 0.87 07/09/2012 1603   CALCIUM 9.9 01/19/2019 1133   PROT 6.2 01/31/2019 1200   ALBUMIN 3.9 01/31/2019 1200   AST 16 01/31/2019 1200   AST 18 01/19/2019 1133   ALT 15 01/31/2019 1200   ALT 17 01/19/2019 1133   ALKPHOS 57 01/31/2019 1200   BILITOT 0.7 01/31/2019 1200   BILITOT 1.1 01/19/2019 1133   GFRNONAA >60 01/19/2019 1133   GFRAA >60 01/19/2019 1133    No results found for: TOTALPROTELP, ALBUMINELP, A1GS, A2GS, BETS, BETA2SER, GAMS, MSPIKE, SPEI  No results found for: KPAFRELGTCHN, LAMBDASER, KAPLAMBRATIO  Lab Results  Component Value Date   WBC 9.4 07/14/2019   NEUTROABS 6.4 07/14/2019   HGB 13.1 07/14/2019   HCT 40.5 07/14/2019   MCV 82.2 07/14/2019   PLT 260 07/14/2019    _0 @  No results found for: LABCA2  No components found for: QMGNOI370  No results for input(s): INR in the last 168 hours.  No results found for: LABCA2  No results found for: WUG891  No results found for: QXI503  No results found for: UUE280  No results found for: CA2729  No components found for: HGQUANT  No results found for: CEA1 / No results found for: CEA1   No results found for: AFPTUMOR  No results found for: CHROMOGRNA  No results found for: PSA1  Appointment on 07/14/2019  Component Date Value Ref Range Status  . WBC Count 07/14/2019 9.4  4.0 - 10.5 K/uL Final  . RBC 07/14/2019  4.93  3.87 - 5.11 MIL/uL Final  . Hemoglobin 07/14/2019 13.1  12.0 - 15.0 g/dL Final  . HCT 07/14/2019 40.5  36.0 - 46.0 % Final  . MCV 07/14/2019 82.2  80.0 - 100.0 fL Final  . MCH 07/14/2019 26.6  26.0 - 34.0 pg Final  . MCHC 07/14/2019 32.3  30.0 - 36.0 g/dL Final  . RDW 07/14/2019 13.9  11.5 - 15.5 % Final  . Platelet Count 07/14/2019 260  150 - 400 K/uL Final  . nRBC 07/14/2019 0.0  0.0 - 0.2 % Final  . Neutrophils Relative % 07/14/2019 67  % Final  . Neutro Abs 07/14/2019 6.4  1.7 - 7.7 K/uL Final  . Lymphocytes Relative 07/14/2019 19  % Final  . Lymphs Abs 07/14/2019 1.8  0.7 - 4.0 K/uL Final  . Monocytes Relative 07/14/2019 7  % Final  . Monocytes Absolute 07/14/2019 0.6  0.1 - 1.0 K/uL Final  . Eosinophils Relative 07/14/2019 6  % Final  . Eosinophils Absolute 07/14/2019 0.5  0.0 - 0.5 K/uL Final  . Basophils Relative 07/14/2019 1  % Final  . Basophils  Absolute 07/14/2019 0.1  0.0 - 0.1 K/uL Final  . Immature Granulocytes 07/14/2019 0  % Final  . Abs Immature Granulocytes 07/14/2019 0.04  0.00 - 0.07 K/uL Final   Performed at [facility], [address] [place], [place] [address]    (this displays the last labs from the last 3 days)  No results found for: TOTALPROTELP, ALBUMINELP, A1GS, A2GS, BETS, BETA2SER, GAMS, MSPIKE, SPEI (this displays SPEP labs)  No results found for: KPAFRELGTCHN, LAMBDASER, KAPLAMBRATIO (kappa/lambda light chains)  No results found for: HGBA, HGBA2QUANT, HGBFQUANT, HGBSQUAN (Hemoglobinopathy evaluation)   No results found for: LDH  Lab Results  Component Value Date   IRON 38 (L) 02/10/2012   TIBC 310 02/10/2012   IRONPCTSAT 12 (L) 02/10/2012   (Iron and TIBC)  Lab Results  Component Value Date   FERRITIN 27 02/09/2018    Urinalysis    Component Value Date/Time   COLORURINE YELLOW 01/12/2018 0840   APPEARANCEUR HAZY (A) 01/12/2018 0840   LABSPEC 1.028 01/12/2018 0840   PHURINE 5.0 01/12/2018 0840    GLUCOSEU NEGATIVE 01/12/2018 0840   HGBUR NEGATIVE 01/12/2018 0840   BILIRUBINUR NEGATIVE 01/12/2018 0840   KETONESUR NEGATIVE 01/12/2018 0840   PROTEINUR NEGATIVE 01/12/2018 0840   UROBILINOGEN 1.0 12/08/2014 1303   NITRITE NEGATIVE 01/12/2018 0840   LEUKOCYTESUR TRACE (A) 01/12/2018 0840     STUDIES: No results found.   ELIGIBLE FOR AVAILABLE RESEARCH PROTOCOL: no   ASSESSMENT: 58 y.o. [place], [place] woman status post right breast upper outer quadrant biopsy 06/04/2018 for a clinical T1c N0, stage IA invasive ductal carcinoma, grade 1, estrogen and progesterone receptor strongly positive, HER-2 not amplified, with an MIB-108%  (1) status post right lumpectomy and sentinel lymph node sampling 07/19/2018 for a pT1c pN1, stage IB invasive ductal carcinoma, grade 1, with negative margins  (2) Mammaprint obtained on the definitive surgical specimen showed "low risk", predicting a risk of recurrence outside the breast within 5 years of 2-3% if the patient takes antiestrogens for 5 years.  No significant benefit from chemotherapy anticipated  (3) adjuvant radiation 09/01/2018 - 10/19/2018  Site/dose: The patient initially received a dose of 50.4 Gy in 28 fractions to the right breast using whole-breast tangent fields. This was delivered using a 3-D conformal technique. The patient then received a boost to the seroma. This delivered an additional 10 Gy in 5 fractions using 15x photons with a Complex Isodose technique. The total dose was 60.4 Gy.  (4) started anastrozole 11/28/2018, discontinued after 4 weeks with diarrhea and other side effects  (5) exemestane started January 2020   PLAN: Jimmye is tolerating exemestane much better than she did anastrozole and the plan will be to continue this drug for a total of 5 years.  We have ordered a bone density, which is not yet scheduled, but she is unlikely to have significant bone loss given her weight.  We discussed pandemic  precautions.  She is being careful.  She understands that she is at very high risk of dying if she does become infected  Normally I would see her again in about 6 months but given the pandemic and the fact that she is doing so well on exemestane I will see her again a year from now  She knows to call for any other issues that may develop before that visit.   Magrinat, Virgie Dad, MD  07/14/19 11:59 AM Medical Oncology and Hematology [facility] [place], [place]  [place] Tel. [phone]    Fax. [phone]   I, Wilburn Mylar, am acting as scribe for Dr. Virgie Dad. Magrinat.  I, Lurline Del MD, have reviewed the above documentation for accuracy and completeness, and I agree with the above.

## 2019-07-14 NOTE — Telephone Encounter (Signed)
Approved.  

## 2019-07-18 ENCOUNTER — Telehealth: Payer: Self-pay | Admitting: Oncology

## 2019-07-18 NOTE — Telephone Encounter (Signed)
I left a message regarding schedule  

## 2019-08-10 ENCOUNTER — Other Ambulatory Visit: Payer: Self-pay | Admitting: *Deleted

## 2019-08-10 DIAGNOSIS — I5032 Chronic diastolic (congestive) heart failure: Secondary | ICD-10-CM

## 2019-08-11 ENCOUNTER — Other Ambulatory Visit: Payer: Self-pay | Admitting: Internal Medicine

## 2019-08-11 DIAGNOSIS — E119 Type 2 diabetes mellitus without complications: Secondary | ICD-10-CM

## 2019-08-11 NOTE — Telephone Encounter (Signed)
PCP do not have any appt at this time, please advised.

## 2019-08-15 MED ORDER — LOSARTAN POTASSIUM 100 MG PO TABS: 100.0000 mg | ORAL_TABLET | Freq: Every day | ORAL | 1 refills | Status: DC

## 2019-08-15 MED ORDER — HYDROCHLOROTHIAZIDE 25 MG PO TABS: 25.0000 mg | ORAL_TABLET | Freq: Every day | ORAL | 1 refills | Status: DC

## 2019-08-30 ENCOUNTER — Telehealth: Payer: Self-pay | Admitting: Pharmacist

## 2019-08-30 NOTE — Telephone Encounter (Addendum)
Prior authorization submitted to Medicaid for continuation of therapy of Praluent 150mg /mL.

## 2019-08-31 NOTE — Telephone Encounter (Addendum)
Prior authorization denied since we don't have a baseline LDL in our system from before patient started on high intensity statin > 3 years ago, even though this is a continuation of therapy request ... they did not accept appeals letter written and stated that pt will need to initiate the appeals process.  Called pt, she has not received denial information yet from Medicaid. She will call clinic when she does so that we can assist her with starting the appeals process.

## 2019-09-02 NOTE — Telephone Encounter (Signed)
Patient called, she received denial letter. Letter states she needs to request fair trial hearing. I advised her to fill out the request form, sign it, and drop it off to [place] and we would fax it for her

## 2019-09-06 NOTE — Telephone Encounter (Signed)
Pt signed and dropped off request for fair trial hearing form - this has been faxed to [facility] office.

## 2019-09-13 ENCOUNTER — Telehealth: Payer: Self-pay | Admitting: Pharmacist

## 2019-09-13 NOTE — Telephone Encounter (Signed)
Received phone call from Sunnie Nielsen, Medicaid appeals mediator to schedule appeals hearing for Ms Zeise on 9/22 at 9:30am. I am off that day, confirmed that Lenna Sciara is able to call in for appeals in my place to act on behalf of patient. Phone # [phone], conference code [phone].

## 2019-09-20 MED ORDER — PRALUENT 150 MG/ML ~~LOC~~ SOAJ: 1.0000 "pen " | SUBCUTANEOUS | 11 refills | Status: DC

## 2019-09-20 NOTE — Telephone Encounter (Signed)
Called into medicaid appeals hearing. Praluent has been approved through 09/09/2020. Patient made aware of approval. Will send new Rx to pharmacy.

## 2019-09-20 NOTE — Addendum Note (Signed)
Addended by: Marcelle Overlie D on: 09/20/2019 10:12 AM   Modules accepted: Orders

## 2019-09-21 ENCOUNTER — Other Ambulatory Visit: Payer: Self-pay | Admitting: Internal Medicine

## 2019-09-21 DIAGNOSIS — E119 Type 2 diabetes mellitus without complications: Secondary | ICD-10-CM

## 2019-10-06 ENCOUNTER — Other Ambulatory Visit: Payer: Self-pay | Admitting: Internal Medicine

## 2019-10-06 DIAGNOSIS — J441 Chronic obstructive pulmonary disease with (acute) exacerbation: Secondary | ICD-10-CM

## 2019-10-06 MED ORDER — TRELEGY ELLIPTA 100-62.5-25 MCG/INH IN AEPB: 1.0000 | INHALATION_SPRAY | Freq: Every day | RESPIRATORY_TRACT | 1 refills | Status: DC

## 2019-10-06 NOTE — Telephone Encounter (Signed)
Approved based on most recent note. Please forward further refills to Dr. Sheppard Coil.

## 2019-10-06 NOTE — Telephone Encounter (Signed)
Needs refill on Fluticasone-Umeclidin-Vilant (TRELEGY ELLIPTA) 100-62.5-25 MCG/INH AEPB  ;pt contact [place], [place]

## 2019-10-06 NOTE — Telephone Encounter (Addendum)
Changed order from print to normal

## 2019-10-06 NOTE — Addendum Note (Signed)
Addended by: Neva Seat B on: 10/06/2019 12:14 PM   Modules accepted: Orders

## 2019-10-10 ENCOUNTER — Other Ambulatory Visit: Payer: Self-pay | Admitting: Internal Medicine

## 2019-10-10 NOTE — Telephone Encounter (Signed)
rtc to pt, lm for rtc 

## 2019-10-10 NOTE — Telephone Encounter (Signed)
Pt requesting a call back about hermetFORMIN (GLUCOPHAGE XR) 500 MG 24 hr tablet that was recalled.  Please call patient back.  [place], [place]

## 2019-10-13 DIAGNOSIS — Z03818 Encounter for observation for suspected exposure to other biological agents ruled out: Secondary | ICD-10-CM | POA: Diagnosis not present

## 2019-10-18 NOTE — Telephone Encounter (Signed)
Rtc, no answer, no vmail 

## 2019-10-20 ENCOUNTER — Ambulatory Visit: Payer: Medicaid Other

## 2019-10-30 ENCOUNTER — Other Ambulatory Visit: Payer: Self-pay | Admitting: Student in an Organized Health Care Education/Training Program

## 2019-10-30 DIAGNOSIS — J441 Chronic obstructive pulmonary disease with (acute) exacerbation: Secondary | ICD-10-CM

## 2019-11-02 ENCOUNTER — Ambulatory Visit: Payer: Medicaid Other | Admitting: Internal Medicine

## 2019-11-02 ENCOUNTER — Encounter: Payer: Self-pay | Admitting: Internal Medicine

## 2019-11-02 ENCOUNTER — Other Ambulatory Visit: Payer: Self-pay

## 2019-11-02 VITALS — BP 133/79 | HR 108 | Temp 98.4°F | Ht 60.0 in | Wt 301.0 lb

## 2019-11-02 DIAGNOSIS — D219 Benign neoplasm of connective and other soft tissue, unspecified: Secondary | ICD-10-CM

## 2019-11-02 DIAGNOSIS — J449 Chronic obstructive pulmonary disease, unspecified: Secondary | ICD-10-CM

## 2019-11-02 DIAGNOSIS — R519 Headache, unspecified: Secondary | ICD-10-CM

## 2019-11-02 DIAGNOSIS — E119 Type 2 diabetes mellitus without complications: Secondary | ICD-10-CM

## 2019-11-02 DIAGNOSIS — Z7984 Long term (current) use of oral hypoglycemic drugs: Secondary | ICD-10-CM | POA: Diagnosis not present

## 2019-11-02 DIAGNOSIS — Z79899 Other long term (current) drug therapy: Secondary | ICD-10-CM

## 2019-11-02 DIAGNOSIS — Z853 Personal history of malignant neoplasm of breast: Secondary | ICD-10-CM

## 2019-11-02 LAB — POCT GLYCOSYLATED HEMOGLOBIN (HGB A1C): Hemoglobin A1C: 6.9 % — AB (ref 4.0–5.6)

## 2019-11-02 LAB — GLUCOSE, CAPILLARY: Glucose-Capillary: 125 mg/dL — ABNORMAL HIGH (ref 70–99)

## 2019-11-02 NOTE — Progress Notes (Signed)
CC: DMII Medication Side Effect  HPI:  KristinKristin [name] is a 58 y.o. with a PMH of breast cancer, DMII, COPD, and fibroids, presents to the clinic to look for alternatives to her metformin. To see management of [name]'s acute and chronic conditions please see the assessment and plan under the encounters tab.   Past Medical History:  Diagnosis Date  . Anemia   . Aortic valve stenosis, severe   . 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 Asthma   . Blood transfusion    2013[facility]  . Breast cancer in female [facility])    Right  . CAD (coronary artery disease)    Cath 2010 with DES x 1 RCA-- PT'S CARDIOLOGIST IS DR. [place]  . Chronic diastolic congestive heart failure ([place])   . COPD (chronic obstructive pulmonary disease) ([place])   . Depression   . Diabetes mellitus DIAGNOSED IN2010   Controll s with diet  . Eczema    on back  . Hyperlipidemia   . Hypertension   . Kidney stones   . Morbid obesity with body mass index of 60.0-69.9 in adult [facility])   . Myocardial infarction ([place])    PT THINKS SHE WAS DX WITH MI AT THE TIME OF HEART STENTING  . Pulmonary embolism ([place]) 02/08/12   S/P RT TOTAL KNEE ON 02/03/12--ON 02/08/12--DEVELOPED ACUTE SOB AND CHEST PAIN--AND DIAGNOSED WITH  PULMONARY EMBOLUS AND PNEUMONIA  . Restless leg syndrome   . Sleep apnea    uses 3 liters O2 at night   . Uterine fibroid    NO PROBLEMS AT PRESENT FROM THE FIBROIDS-STATES SHE IS POST MENOPAUSAL-LAST MENSES 2010 EXCEPT FOR EPISODE THIS YR OF BLEEDING RELATED TO FIBROIDS.  Marland Kitchen Weakness    BOTH HANDS - S/P BILATERAL CARPAL TUNNEL RELEASE--BUT STILL HAS WEAKNESS--OFTEN DROPS THINGS   Review of Systems:   Review of Systems  Constitutional: Negative for chills, fever and weight loss.  Eyes: Negative for blurred vision and double vision.  Cardiovascular: Negative for chest pain and palpitations.   Gastrointestinal: Negative for abdominal pain, constipation, diarrhea, nausea and vomiting.  Neurological: Negative for dizziness.       Patient states that she has headaches, but they have been chronic.    Physical Exam:  Vitals:   11/02/19 1048  BP: 133/79  Pulse: (!) 108  Temp: 98.4 F (36.9 C)  TempSrc: Oral  SpO2: 94%  Weight: (!) 301 lb (136.5 kg)  Height: 5' (1.524 m)   Physical Exam Constitutional:      General: She is not in acute distress.    Appearance: Normal appearance. She is obese. She is not ill-appearing or toxic-appearing.     Comments: Patient sitting comfortably in chair, in no acute distress.   HENT:     Head: Normocephalic and atraumatic.  Cardiovascular:     Rate and Rhythm: Normal rate and regular rhythm.     Pulses: Normal pulses.     Heart sounds: Normal heart sounds. No murmur. No friction rub. No gallop.   Pulmonary:     Effort: Pulmonary effort is normal.     Breath sounds: Normal breath sounds. No wheezing, rhonchi or rales.  Abdominal:     General: Abdomen is flat. Bowel sounds are normal.     Tenderness: There is no abdominal tenderness. There is no guarding.  Musculoskeletal:  Right lower leg: No edema.     Left lower leg: No edema.  Neurological:     Mental Status: She is alert and oriented to person, place, and time.  Psychiatric:        Mood and Affect: Mood normal.        Behavior: Behavior normal.     Assessment & Plan:   See Encounters Tab for problem based charting.  Patient seen with Dr. Dareen Piano

## 2019-11-02 NOTE — Patient Instructions (Addendum)
Mrs. Prosen,  It was a pleasure working with you today. During our visit we discontinued your metformin and you will continue your Victoza. Your A1c was at 6.9, and we will reassess your blood sugar levels in 3 months, and make any necessary changes, if there are any. Have a good day! Sincerely,  Maudie Mercury

## 2019-11-02 NOTE — Assessment & Plan Note (Signed)
Assessment:  Patient presents to the clinic today to discuss her metformin medication. She states that she would like to discontinue her medication after the recent recall for the 500 mg/750mg  extended release tablets. She does not feel comfortable taking metformin in general, and states that she has had breast cancer while on the medication. We discussed alternative medications pending her A1c labs.   Plan: -Ordered Hemoglobin A1c which came back at 6.9. We will monitor her on her current dose of Victoza and reassess in 3 months time. Consider starting insulin if HgbA1C increases.  - Discontinued Metformin.

## 2019-11-03 DIAGNOSIS — H40013 Open angle with borderline findings, low risk, bilateral: Secondary | ICD-10-CM | POA: Diagnosis not present

## 2019-11-03 DIAGNOSIS — H2513 Age-related nuclear cataract, bilateral: Secondary | ICD-10-CM | POA: Diagnosis not present

## 2019-11-03 DIAGNOSIS — E119 Type 2 diabetes mellitus without complications: Secondary | ICD-10-CM | POA: Diagnosis not present

## 2019-11-03 LAB — HM DIABETES EYE EXAM

## 2019-11-04 ENCOUNTER — Encounter: Payer: Self-pay | Admitting: *Deleted

## 2019-11-08 ENCOUNTER — Other Ambulatory Visit: Payer: Self-pay | Admitting: *Deleted

## 2019-11-08 DIAGNOSIS — F3342 Major depressive disorder, recurrent, in full remission: Secondary | ICD-10-CM

## 2019-11-08 MED ORDER — DULOXETINE HCL 30 MG PO CPEP: 30.0000 mg | ORAL_CAPSULE | Freq: Two times a day (BID) | ORAL | 1 refills | Status: DC

## 2019-11-08 NOTE — Telephone Encounter (Signed)
error 

## 2019-11-09 NOTE — Progress Notes (Signed)
Internal Medicine Clinic Attending  I saw and evaluated the patient.  I personally confirmed the key portions of the history and exam documented by Dr. Winters and I reviewed pertinent patient test results.  The assessment, diagnosis, and plan were formulated together and I agree with the documentation in the resident's note.  

## 2019-11-29 ENCOUNTER — Other Ambulatory Visit: Payer: Self-pay | Admitting: Internal Medicine

## 2019-11-29 DIAGNOSIS — J441 Chronic obstructive pulmonary disease with (acute) exacerbation: Secondary | ICD-10-CM

## 2020-01-02 ENCOUNTER — Other Ambulatory Visit: Payer: Self-pay | Admitting: Internal Medicine

## 2020-01-02 DIAGNOSIS — J441 Chronic obstructive pulmonary disease with (acute) exacerbation: Secondary | ICD-10-CM

## 2020-01-05 ENCOUNTER — Ambulatory Visit (INDEPENDENT_AMBULATORY_CARE_PROVIDER_SITE_OTHER): Payer: Medicaid Other | Admitting: Cardiovascular Disease

## 2020-01-05 ENCOUNTER — Encounter: Payer: Self-pay | Admitting: Cardiovascular Disease

## 2020-01-05 ENCOUNTER — Other Ambulatory Visit: Payer: Self-pay

## 2020-01-05 VITALS — BP 128/70 | HR 88 | Ht 60.0 in | Wt 303.8 lb

## 2020-01-05 DIAGNOSIS — I2510 Atherosclerotic heart disease of native coronary artery without angina pectoris: Secondary | ICD-10-CM | POA: Diagnosis not present

## 2020-01-05 DIAGNOSIS — I350 Nonrheumatic aortic (valve) stenosis: Secondary | ICD-10-CM

## 2020-01-05 DIAGNOSIS — E7800 Pure hypercholesterolemia, unspecified: Secondary | ICD-10-CM | POA: Diagnosis not present

## 2020-01-05 NOTE — Progress Notes (Signed)
Chief Complaint  Patient presents with  . Follow-up    CAD    History of Present Illness: 59 yo female with history of severe aortic stenosis now s/p TAVR,  CAD, OSA, HTN, HLD, obesity, GERD, asthma, COPD, former tobacco abuse, depression here today for cardiac follow up. She had a cardiac cath in February 2010 and had a drug eluting stent (2.20m Xience) placed in the distal RCA. Her LAD and Circumflex were free of disease. She had her right knee replaced in February 21740 complicated by a PE. She was started on coumadin and treated for 6 months. CTA chest on 08/09/12 showed no evidence of PE so her coumadin was stopped. I saw her in February 2014 and she c/o severe dyspnea with minimal exertion. I felt that this was multi-factorial. Chest CTA with no PE. I arranged a stress myoview which showed possible ischemia. Echo with severe AS, normal LV function. Cardiac cath on 03/17/13 with mild non-obstructive disease and patent stent RCA. She continued to have severe dyspnea. Most recent cath 09/14/14 with 70% ostial RCA stenosis treated medically and no obstructive disease in the LAD or Circumflex. She underwent TAVR on 12/12/14 and did well. (26 mm Sapien 3 bioprosthetic valve). Echo December 2016 with normally functioning bioprosthetic aortic valve.   She is here today for follow up. The patient denies any dyspnea, palpitations, lower extremity edema, orthopnea, PND, dizziness, near syncope or syncope. She has lost 60 lbs over the last three years. Rare chest pains.    .  Primary Care Physician: AEarlene Plater MD  Past Medical History:  Diagnosis Date  . Anemia   . Aortic valve stenosis, severe   . 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 Asthma   . Blood transfusion    2013-[facility] . Breast cancer in female ([facility]    Right  . CAD (coronary artery disease)    Cath 2010 with DES x 1  RCA-- PT'S CARDIOLOGIST IS DR. [place] . Chronic diastolic congestive heart failure ([place]   . COPD (chronic obstructive pulmonary disease) ([place]   . Depression   . Diabetes mellitus DIAGNOSED IN2010   Controll s with diet  . Eczema    on back  . Hyperlipidemia   . Hypertension   . Kidney stones   . Morbid obesity with body mass index of 60.0-69.9 in adult ([facility]   . [place]    PT THINKS SHE WAS DX WITH MI AT THE TIME OF HEART STENTING  . [place] ([place] 02/08/12   S/P RT TOTAL KNEE ON 02/03/12--ON 02/08/12--DEVELOPED ACUTE SOB AND CHEST PAIN--AND DIAGNOSED WITH  PULMONARY EMBOLUS AND PNEUMONIA  . Restless leg syndrome   . Sleep apnea    uses 3 liters O2 at night   . Uterine fibroid    NO PROBLEMS AT PRESENT FROM THE FIBROIDS-STATES SHE IS POST MENOPAUSAL-LAST MENSES 2010 EXCEPT FOR EPISODE THIS YR OF BLEEDING RELATED TO FIBROIDS.  .Marland KitchenWeakness    BOTH HANDS - S/P BILATERAL CARPAL TUNNEL RELEASE--BUT STILL HAS WEAKNESS--OFTEN DROPS THINGS    Past Surgical History:  Procedure Laterality Date  . BREAST LUMPECTOMY WITH RADIOACTIVE SEED AND SENTINEL LYMPH NODE BIOPSY Right 07/19/2018   Procedure: BREAST LUMPECTOMY WITH RADIOACTIVE SEED AND SENTINEL LYMPH NODE BIOPSY;  Surgeon: NAlphonsa Overall MD;  [facility]  Service: General;  Laterality: Right;  . CARDIAC CATHETERIZATION    . CARPAL TUNNEL RELEASE     Bilateral  . CHOLECYSTECTOMY    . CORONARY ANGIOPLASTY     2010 has stent in place  . CYSTOSCOPY W/ RETROGRADES Right 09/21/2013   Procedure: CYSTOSCOPY WITH RIGHT RETROGRADE PYELOGRAM RIGHT DOUBLE J STENT ;  Surgeon: Fredricka Bonine, MD;  [facility];  Service: Urology;  Laterality: Right;  . CYSTOSCOPY WITH URETEROSCOPY AND STENT PLACEMENT Right 10/25/2013   Procedure: CYSTOSCOPY RIGHT URETEROSCOPY HOLMIUM LASER LITHO AND STENT PLACEMENT;  Surgeon: Fredricka Bonine, MD;  Location: [facility];  Service: Urology;  Laterality: Right;  .  HERNIA REPAIR    . INTRAOPERATIVE TRANSESOPHAGEAL ECHOCARDIOGRAM N/A 12/12/2014   Procedure: INTRAOPERATIVE TRANSESOPHAGEAL ECHOCARDIOGRAM;  Surgeon: Burnell Blanks, MD;  Location: [facility] OR;  Service: Cardiovascular;  Laterality: N/A;  . JOINT REPLACEMENT     bil total knees  . KNEE ARTHROPLASTY  02/03/2012   Procedure: COMPUTER ASSISTED TOTAL KNEE ARTHROPLASTY;  Surgeon: Mcarthur Rossetti, MD;  Location: [place];  Service: Orthopedics;  Laterality: Right;  Right total knee arthroplasty  . LEFT AND RIGHT HEART CATHETERIZATION WITH CORONARY ANGIOGRAM N/A 03/17/2013   Procedure: LEFT AND RIGHT HEART CATHETERIZATION WITH CORONARY ANGIOGRAM;  Surgeon: Burnell Blanks, MD;  Location: [facility] CATH LAB;  Service: Cardiovascular;  Laterality: N/A;  . LEFT AND RIGHT HEART CATHETERIZATION WITH CORONARY/GRAFT ANGIOGRAM N/A 09/14/2014   Procedure: LEFT AND RIGHT HEART CATHETERIZATION WITH Beatrix Fetters;  Surgeon: Burnell Blanks, MD;  Location: [facility] CATH LAB;  Service: Cardiovascular;  Laterality: N/A;  . TEE WITHOUT CARDIOVERSION N/A 03/14/2013   Procedure: TRANSESOPHAGEAL ECHOCARDIOGRAM (TEE);  Surgeon: Lelon Perla, MD;  Location[facility] ENDOSCOPY;  Service: Cardiovascular;  Laterality: N/A;  . TEE WITHOUT CARDIOVERSION N/A 11/14/2014   Procedure: TRANSESOPHAGEAL ECHOCARDIOGRAM (TEE);  Surgeon: Lelon Perla, MD;  Location: [facility] ENDOSCOPY;  Service: Cardiovascular;  Laterality: N/A;  . TOTAL KNEE ARTHROPLASTY  09/10/2012   Procedure: TOTAL KNEE ARTHROPLASTY;  Surgeon: Mcarthur Rossetti, MD;  [facility];  Service: Orthopedics;  Laterality: Left;  . TOTAL KNEE REVISION Right 07/15/2013   Procedure: REVISION ARTHROPLASTY RIGHT KNEE;  Surgeon: Mcarthur Rossetti, MD;  [facility];  Service: Orthopedics;  Laterality: Right;  . TRANSCATHETER AORTIC VALVE REPLACEMENT, TRANSFEMORAL N/A 12/12/2014   Procedure: TRANSCATHETER AORTIC VALVE REPLACEMENT, TRANSFEMORAL;  Surgeon:  Burnell Blanks, MD;  Location: [place];  Service: Cardiovascular;  Laterality: N/A;  . TRIGGER FINGER RELEASE  09/10/2012   Procedure: RELEASE TRIGGER FINGER/A-1 PULLEY;  Surgeon: Mcarthur Rossetti, MD;  [facility];  Service: Orthopedics;  Laterality: Right;  Right Ring Finger  . TUBAL LIGATION      Current Outpatient Medications  Medication Sig Dispense Refill  . Accu-Chek FastClix Lancets MISC Check blood sugar two times a day 102 each 9  . albuterol (PROVENTIL) (2.5 MG/3ML) 0.083% nebulizer solution Take 3 mLs (2.5 mg total) by nebulization every 4 (four) hours as needed for wheezing or shortness of breath. 180 mL 1  . albuterol (VENTOLIN HFA) 108 (90 Base) MCG/ACT inhaler INHALE 2 PUFFS BY MOUTH EVERY 4 HOURS AS NEEDED FOR COUGH, WHEEZING, OR SHORTNESS OF BREATH 18 g 3  . Alirocumab (PRALUENT) 150 MG/ML SOAJ Inject 1 pen into the skin every 14 (fourteen) days. 2 pen 11  . ASPIRIN ADULT LOW STRENGTH 81 MG EC tablet TAKE 1 Tablet BY MOUTH ONCE DAILY 90 tablet 3  . atorvastatin (LIPITOR) 80 MG tablet Take  1 tablet (80 mg total) by mouth daily. 90 tablet 1  . Blood Glucose Monitoring Suppl (ACCU-CHEK GUIDE) w/Device KIT 1 each by Does not apply route 2 (two) times daily. 1 kit 1  . clopidogrel (PLAVIX) 75 MG tablet Take 1 tablet (75 mg total) by mouth every morning. 90 tablet 1  . DULoxetine (CYMBALTA) 30 MG capsule Take 1 capsule (30 mg total) by mouth 2 (two) times daily. 180 capsule 1  . exemestane (AROMASIN) 25 MG tablet Take 1 tablet (25 mg total) by mouth daily after breakfast. 90 tablet 12  . ezetimibe (ZETIA) 10 MG tablet Take 1 tablet (10 mg total) by mouth daily. 90 tablet 1  . furosemide (LASIX) 40 MG tablet Take 1 tablet (40 mg total) by mouth 2 (two) times daily. 180 tablet 1  . gabapentin (NEURONTIN) 300 MG capsule TAKE 3 CAPSULES BY MOUTH AT BEDTIME 90 capsule 5  . glucose blood (ACCU-CHEK GUIDE) test strip Check blood sugar 2 times per day 100 each 9  .  hydrochlorothiazide (HYDRODIURIL) 25 MG tablet Take 1 tablet (25 mg total) by mouth daily. 90 tablet 1  . ibuprofen (ADVIL,MOTRIN) 200 MG tablet Take 400 mg by mouth every 6 (six) hours as needed for moderate pain.    . Insulin Pen Needle (PEN NEEDLES) 32G X 4 MM MISC 1.8 mg by Does not apply route daily. 300 each 2  . liraglutide (VICTOZA) 18 MG/3ML SOPN Inject 0.3 mLs (1.8 mg total) into the skin daily. 5 pen 5  . losartan (COZAAR) 100 MG tablet Take 1 tablet (100 mg total) by mouth daily. 90 tablet 1  . metoprolol tartrate (LOPRESSOR) 25 MG tablet Take 1 tablet (25 mg total) by mouth 2 (two) times daily. 180 tablet 1  . nitroGLYCERIN (NITROSTAT) 0.4 MG SL tablet Place 1 tablet (0.4 mg total) under the tongue every 5 (five) minutes as needed for chest pain. 100 tablet 0  . OXYGEN Inhale 3 L into the lungs as needed.    . potassium chloride SA (K-DUR,KLOR-CON) 20 MEQ tablet Take 2 tablets (40 mEq total) by mouth daily. 180 tablet 1  . TRELEGY ELLIPTA 100-62.5-25 MCG/INH AEPB INHALE 1 PUFF ONCE DAILY 180 each 1  . liraglutide (VICTOZA) 18 MG/3ML SOPN Inject 0.1 mLs (0.6 mg total) into the skin daily for 7 days, THEN 0.2 mLs (1.2 mg total) daily. 6.7 mL 5   No current facility-administered medications for this visit.    No Known Allergies  Social History   Socioeconomic History  . Marital status: Married    Spouse name: Not on file  . Number of children: 2  . Years of education: Not on file  . Highest education level: Not on file  Occupational History  . Occupation: Disabled  Tobacco Use  . Smoking status: Former Smoker    Packs/day: 1.50    Years: 30.00    Pack years: 45.00    Types: Cigarettes    Quit date: 12/29/2000    Years since quitting: 19.0  . Smokeless tobacco: Never Used  Substance and Sexual Activity  . Alcohol use: Yes    Alcohol/week: 1.0 standard drinks    Types: 1 Cans of beer per week    Comment: rarely  . Drug use: No  . Sexual activity: Not Currently     Birth control/protection: Surgical  Other Topics Concern  . Not on file  Social History Narrative  . Not on file   Social Determinants of Health   Financial  Resource Strain:   . Difficulty of Paying Living Expenses: Not on file  Food Insecurity:   . Worried About Charity fundraiser in the Last Year: Not on file  . Ran Out of Food in the Last Year: Not on file  Transportation Needs:   . Lack of Transportation (Medical): Not on file  . Lack of Transportation (Non-Medical): Not on file  Physical Activity:   . Days of Exercise per Week: Not on file  . Minutes of Exercise per Session: Not on file  Stress:   . Feeling of Stress : Not on file  Social Connections:   . Frequency of Communication with Friends and Family: Not on file  . Frequency of Social Gatherings with Friends and Family: Not on file  . Attends Religious Services: Not on file  . Active Member of Clubs or Organizations: Not on file  . Attends Archivist Meetings: Not on file  . Marital Status: Not on file  Intimate Partner Violence:   . Fear of Current or Ex-Partner: Not on file  . Emotionally Abused: Not on file  . Physically Abused: Not on file  . Sexually Abused: Not on file    Family History  Problem Relation Age of Onset  . Breast cancer Mother        stage IV at diagnosis  . Emphysema Mother        smoked  . Heart disease Mother   . COPD Father        smoked  . Asthma Father   . Heart disease Father   . Cancer Brother        Sinus    Review of Systems:  As stated in the HPI and otherwise negative.   BP 128/70   Pulse 88   Ht 5' (1.524 m)   Wt (!) 303 lb 12.8 oz (137.8 kg)   LMP 02/03/2012   SpO2 97%   BMI 59.33 kg/m   Physical Examination:  General: Well developed, well nourished, NAD  HEENT: OP clear, mucus membranes moist  SKIN: warm, dry. No rashes. Neuro: No focal deficits  Musculoskeletal: Muscle strength 5/5 all ext  Psychiatric: Mood and affect normal  Neck: No JVD,  no carotid bruits, no thyromegaly, no lymphadenopathy.  Lungs:Clear bilaterally, no wheezes, rhonci, crackles Cardiovascular: Regular rate and rhythm. No murmurs, gallops or rubs. Abdomen:Soft. Bowel sounds present. Non-tender.  Extremities: No lower extremity edema. Pulses are 2 + in the bilateral DP/PT.   Cardiac cath 09/14/14: Left main: No obstructive disease.  Left Anterior Descending Artery: Large caliber vessel that courses to the apex. There is a moderate caliber diagonal branch. No obstructive disease noted.  Circumflex Artery: Large caliber vessel with two small obtuse marginal branches and a moderate caliber posterolateral branch. No obstructive disease noted.  Right Coronary Artery: Large dominant vessel with 70-80% ostial stenosis. 40% proximal stenosis. Distal stented segment is patent with no restenosis.  Left Ventricular Angiogram: Deferred.    Echo December 2016: Left ventricle: The cavity size was normal. There was mild   concentric hypertrophy. Systolic function was vigorous. The   estimated ejection fraction was in the range of 65% to 70%. Wall   motion was normal; there were no regional wall motion   abnormalities. Doppler parameters are consistent with abnormal   left ventricular relaxation (grade 1 diastolic dysfunction).   Doppler parameters are consistent with elevated ventricular   end-diastolic filling pressure. - Aortic valve: A bioprosthetic Edward-SAPIEN valve is seated well  in the artic position, leaflets are poorly visualized.   There is trivial aortic regurgitation. Transaortic gradients are   elevated with mean 30 mmHg, peak gradient 42 mmHg. Trileaflet;   normal thickness leaflets. - Aortic root: The aortic root was normal in size. - Mitral valve: Calcified annulus. Mildly thickened leaflets . - Left atrium: The atrium was mildly dilated. - Right atrium: The atrium was normal in size. - Tricuspid valve: There was no regurgitation. - Pulmonary  arteries: Systolic pressure was within the normal   range. - Inferior vena cava: The vessel was normal in size. - Pericardium, extracardiac: There was no pericardial effusion.  Impressions:  - A bioprosthetic Edward-SAPIEN valve is seated well in the artic   position, leaflets are poorly visualized.   There is trivial aortic regurgitation. Transaortic gradients are   elevated with mean 30 mmHg, peak gradient 42 mmHg.   The gradients are slightly elevated when compared to the prior   study from 02/07/2015 (mean 25 mmHg, peak 38 mmHg).  EKG:  EKG is ordered today. The ekg ordered today demonstrates  Sinus rate 83 bpm  Recent Labs: 07/14/2019: ALT 13; BUN 12; Creatinine 0.84; Hemoglobin 13.1; Platelet Count 260; Potassium 4.1; Sodium 137   Lipid Panel    Component Value Date/Time   CHOL 97 (L) 01/31/2019 1200   TRIG 104 01/31/2019 1200   HDL 50 01/31/2019 1200   CHOLHDL 1.9 01/31/2019 1200   CHOLHDL 3.7 12/28/2015 1052   VLDL 20 12/28/2015 1052   LDLCALC 26 01/31/2019 1200   LDLDIRECT 162.0 10/17/2013 0905     Wt Readings from Last 3 Encounters:  01/05/20 (!) 303 lb 12.8 oz (137.8 kg)  11/02/19 (!) 301 lb (136.5 kg)  07/14/19 (!) 302 lb 9.6 oz (137.3 kg)     Other studies Reviewed: Additional studies/ records that were reviewed today include: . Review of the above records demonstrates:    Assessment and Plan:   1. Severe Aortic valve stenosis: She is s/p TAVR on 12/12/14. Her valve was working well by echo in December 2016. Continue ASA and Plavix given her CAD and valve replacement. She is aware that she should use SBE prophylaxis prior to select procedures including dental visits.    2. CAD without angina: Cardiac cath September 2015 with moderate ostial RCA stenosis and no disease in the LAD or Circumflex. She has no chest pain that sounds consistent with angina. Continue ASA, Plavix, statin and beta blocker.    3. Hyperlipidemia: LDL at goal in February 2020.  Continue statin.    4. Morbid obesity: Weight loss is advised.   Current medicines are reviewed at length with the patient today.  The patient does not have concerns regarding medicines.  The following changes have been made:  no change  Labs/ tests ordered today include:   Orders Placed This Encounter  Procedures  . EKG 12-Lead  . ECHOCARDIOGRAM COMPLETE    Disposition:   FU with me in 12  months  Signed, Lauree Chandler, MD 01/05/2020 4:55 PM    [facility] [address], [place], [place]  [address] Phone: [phone]; Fax: [phone]

## 2020-01-05 NOTE — Patient Instructions (Signed)
Medication Instructions:  No changes *If you need a refill on your cardiac medications before your next appointment, please call your pharmacy*  Lab Work: none If you have labs (blood work) drawn today and your tests are completely normal, you will receive your results only by: Marland Kitchen MyChart Message (if you have MyChart) OR . A paper copy in the mail If you have any lab test that is abnormal or we need to change your treatment, we will call you to review the results.  Testing/Procedures: Your physician has requested that you have an echocardiogram. Echocardiography is a painless test that uses sound waves to create images of your heart. It provides your doctor with information about the size and shape of your heart and how well your heart's chambers and valves are working. This procedure takes approximately one hour. There are no restrictions for this procedure.   Follow-Up: At [facility], you and your health needs are our priority.  As part of our continuing mission to provide you with exceptional heart care, we have created designated Provider Care Teams.  These Care Teams include your primary Cardiologist (physician) and Advanced Practice Providers (APPs -  Physician Assistants and Nurse Practitioners) who all work together to provide you with the care you need, when you need it.  Your next appointment:   12 month(s)  The format for your next appointment:   Either In Person or Virtual  Provider:   You may see Lauree Chandler, MD or one of the following Advanced Practice Providers on your designated Care Team:    Melina Copa, PA-C  Ermalinda Barrios, PA-C   Other Instructions

## 2020-01-17 ENCOUNTER — Ambulatory Visit (HOSPITAL_COMMUNITY): Payer: Medicaid Other | Attending: Cardiovascular Disease

## 2020-01-17 ENCOUNTER — Other Ambulatory Visit: Payer: Self-pay | Admitting: *Deleted

## 2020-01-17 ENCOUNTER — Other Ambulatory Visit: Payer: Self-pay

## 2020-01-17 DIAGNOSIS — I2510 Atherosclerotic heart disease of native coronary artery without angina pectoris: Secondary | ICD-10-CM

## 2020-01-17 DIAGNOSIS — I5032 Chronic diastolic (congestive) heart failure: Secondary | ICD-10-CM

## 2020-01-17 DIAGNOSIS — E7800 Pure hypercholesterolemia, unspecified: Secondary | ICD-10-CM | POA: Diagnosis not present

## 2020-01-17 DIAGNOSIS — I350 Nonrheumatic aortic (valve) stenosis: Secondary | ICD-10-CM

## 2020-01-17 MED ORDER — PERFLUTREN LIPID MICROSPHERE
1.0000 mL | INTRAVENOUS | Status: AC | PRN
  Administered 2020-01-17: 2 mL via INTRAVENOUS

## 2020-01-17 MED ORDER — CLOPIDOGREL BISULFATE 75 MG PO TABS: 75.0000 mg | ORAL_TABLET | Freq: Every morning | ORAL | 1 refills | Status: DC

## 2020-01-17 MED ORDER — METOPROLOL TARTRATE 25 MG PO TABS: 25.0000 mg | ORAL_TABLET | Freq: Two times a day (BID) | ORAL | 1 refills | Status: DC

## 2020-01-20 NOTE — Progress Notes (Signed)
Virtual Visit via Video Note  I connected with [name] on 01/30/20 at  9:45 AM EST by a video enabled telemedicine application and verified that I am speaking with the correct person using two identifiers.  Location: Patient: Home  Provider: Clinic  This service was conducted via virtual visit.  Both audio and visual tools were used.  The patient was located at home. I was located in my office.  Consent was obtained prior to the virtual visit and is aware of possible charges through their insurance for this visit.  The patient is an established patient.  Dr. Estanislado Pandy, MD conducted the virtual visit and Hazel Sams, PA-C acted as scribe during the service.  Office staff helped with scheduling follow up visits after the service was conducted.   I discussed the limitations of evaluation and management by telemedicine and the availability of in person appointments. The patient expressed understanding and agreed to proceed.  CC: History of Present Illness: Patient is a 59 year old female with a complicated medical history.  She has been seen in consultation per request of her PCP for evaluation of joint pain.  According to patient she has been having discomfort in her joints for many years.  She had bilateral total knee replacement several years ago which has been doing well.  For the last 6 7 months she has been asked experiencing pain in her hands.  She denies any joint swelling.  I reviewed her chart and found that she had x-rays in June 2020 which were consistent with osteoarthritis.  She also had some lab work including rheumatoid factor, anti-CCP, ANA and ENA which was all within normal limits.  She had a whole-body bone scan in August 2019 which was consistent with osteoarthritis.  She states she has been having discomfort in her left hip joint which has been getting worse over the last 1 year.  An x-ray of the left hip joint on chart review in August 2019 showed mild osteoarthritic  changes.  She complains of pain and discomfort in her bilateral shoulders more so on the left than the right side.  She denies any joint swelling.  She also complains of some lower back pain.  She states she takes Tylenol and Advil for pain relief but it does not help her much.  There is no family history of autoimmune disease.  There is no history of oral ulcers, nasal ulcers, malar rash, photosensitivity, Raynaud's phenomenon, GI bleed, lymphadenopathy.  Review of Systems  Constitutional: Positive for malaise/fatigue.  HENT: Positive for congestion.   Eyes: Negative for pain.  Respiratory: Positive for shortness of breath.   Cardiovascular: Negative for leg swelling.  Gastrointestinal: Negative for constipation.  Genitourinary: Positive for frequency and urgency.  Musculoskeletal: Positive for joint pain.  Skin: Negative for rash.  Neurological: Negative for weakness.  Endo/Heme/Allergies: Bruises/bleeds easily.  Psychiatric/Behavioral: Positive for memory loss.      Observations/Objective: Physical Exam  Constitutional: She is oriented to person, place, and time and well-developed, well-nourished, and in no distress.  HENT:  Head: Normocephalic and atraumatic.  Eyes: Conjunctivae are normal.  Pulmonary/Chest: Effort normal.  Neurological: She is alert and oriented to person, place, and time.  Psychiatric: Mood, memory, affect and judgment normal.   Patient reports morning stiffness for 24 hours.   Patient reports nocturnal pain.  Difficulty dressing/grooming: Reports Difficulty climbing stairs: Reports Difficulty getting out of chair: Reports Difficulty using hands for taps, buttons, cutlery, and/or writing: Reports  Assessment and Plan:  Diagnoses and all orders for this visit:  Polyarthralgia-patient complains of pain and discomfort in multiple joints over several years.  The pain has been progressively getting worse.  To complete the work-up I will obtain following labs. -      Uric acid; Future -     Sedimentation rate; Future -     14-3-3 eta Protein; Future -     CK; Future  Chronic left shoulder pain-she complains of chronic pain in her left shoulder.  She has difficulty raising her arm.  She also has nocturnal pain in her shoulder.  She has some discomfort in her right shoulder as well.  I plan to obtain x-rays at the follow-up visit.  Primary osteoarthritis of both hands-she complains of pain in her bilateral hands.  X-rays done of her bilateral hands in June 2020 were consistent with osteoarthritis.  All the autoimmune work-up in the past has been negative.  There is no family history of autoimmune disease. Comments: 06/09/19: ANA-, ENA-, RF-, CCP-  Pain of left hip joint-she complains of pain and discomfort in her left hip.  On chart review I found that her x-rays from August 2019 were consistent with mild osteoarthritis.  Status post total knee replacement, bilateral-she has had bilateral total knee replacements in the past.  She is currently not having much discomfort in her knees.  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  Nonrheumatic aortic valve stenosis  Chronic diastolic congestive heart failure (HCC)  Coronary artery disease involving native coronary artery of native heart without angina pectoris  History of pulmonary embolus (PE)  Long term (current) use of anticoagulants  History of hyperlipidemia  Type 2 diabetes mellitus without complication, without long-term current use of insulin (HCC)  Gastroesophageal reflux disease without esophagitis  Obstructive sleep apnea  Chronic obstructive pulmonary disease with acute exacerbation (HCC)  Nocturnal hypoxemia  History of depression      Follow Up  Instructions: She will follow up in    I discussed the assessment and treatment plan with the patient. The patient was provided an opportunity to ask questions and all were answered. The patient agreed with the plan and demonstrated an understanding of the instructions.   The patient was advised to call back or seek an in-person evaluation if the symptoms worsen or if the condition fails to improve as anticipated.  I provided 45 minutes of non-face-to-face time during this encounter.   Bo Merino, MD

## 2020-01-23 ENCOUNTER — Telehealth: Payer: Self-pay | Admitting: Cardiovascular Disease

## 2020-01-23 DIAGNOSIS — I350 Nonrheumatic aortic (valve) stenosis: Secondary | ICD-10-CM

## 2020-01-23 NOTE — Telephone Encounter (Signed)
Patient informed of echo results and plan to repeat in 1 year.  Verbalizes understanding.  Order placed for repeat echo one year.

## 2020-01-23 NOTE — Telephone Encounter (Signed)
New Message  Pt was calling back to get the results of her ECHO results.  Please call to discuss

## 2020-01-30 ENCOUNTER — Other Ambulatory Visit: Payer: Self-pay

## 2020-01-30 ENCOUNTER — Encounter: Payer: Self-pay | Admitting: Rheumatology

## 2020-01-30 ENCOUNTER — Telehealth (INDEPENDENT_AMBULATORY_CARE_PROVIDER_SITE_OTHER): Payer: Medicaid Other | Admitting: Rheumatology

## 2020-01-30 DIAGNOSIS — Z96653 Presence of artificial knee joint, bilateral: Secondary | ICD-10-CM

## 2020-01-30 DIAGNOSIS — M25512 Pain in left shoulder: Secondary | ICD-10-CM | POA: Diagnosis not present

## 2020-01-30 DIAGNOSIS — M2550 Pain in unspecified joint: Secondary | ICD-10-CM

## 2020-01-30 DIAGNOSIS — Z86711 Personal history of pulmonary embolism: Secondary | ICD-10-CM

## 2020-01-30 DIAGNOSIS — I5032 Chronic diastolic (congestive) heart failure: Secondary | ICD-10-CM

## 2020-01-30 DIAGNOSIS — C50411 Malignant neoplasm of upper-outer quadrant of right female breast: Secondary | ICD-10-CM

## 2020-01-30 DIAGNOSIS — Z7901 Long term (current) use of anticoagulants: Secondary | ICD-10-CM

## 2020-01-30 DIAGNOSIS — G4733 Obstructive sleep apnea (adult) (pediatric): Secondary | ICD-10-CM

## 2020-01-30 DIAGNOSIS — I350 Nonrheumatic aortic (valve) stenosis: Secondary | ICD-10-CM | POA: Diagnosis not present

## 2020-01-30 DIAGNOSIS — Z953 Presence of xenogenic heart valve: Secondary | ICD-10-CM

## 2020-01-30 DIAGNOSIS — I2510 Atherosclerotic heart disease of native coronary artery without angina pectoris: Secondary | ICD-10-CM

## 2020-01-30 DIAGNOSIS — K219 Gastro-esophageal reflux disease without esophagitis: Secondary | ICD-10-CM

## 2020-01-30 DIAGNOSIS — M25552 Pain in left hip: Secondary | ICD-10-CM

## 2020-01-30 DIAGNOSIS — M19042 Primary osteoarthritis, left hand: Secondary | ICD-10-CM

## 2020-01-30 DIAGNOSIS — G8929 Other chronic pain: Secondary | ICD-10-CM

## 2020-01-30 DIAGNOSIS — M19041 Primary osteoarthritis, right hand: Secondary | ICD-10-CM

## 2020-01-30 DIAGNOSIS — Z8659 Personal history of other mental and behavioral disorders: Secondary | ICD-10-CM

## 2020-01-30 DIAGNOSIS — Z8639 Personal history of other endocrine, nutritional and metabolic disease: Secondary | ICD-10-CM

## 2020-01-30 DIAGNOSIS — J441 Chronic obstructive pulmonary disease with (acute) exacerbation: Secondary | ICD-10-CM

## 2020-01-30 DIAGNOSIS — R5383 Other fatigue: Secondary | ICD-10-CM

## 2020-01-30 DIAGNOSIS — Z170 Estrogen receptor positive status [ER+]: Secondary | ICD-10-CM

## 2020-01-30 DIAGNOSIS — E119 Type 2 diabetes mellitus without complications: Secondary | ICD-10-CM

## 2020-01-30 DIAGNOSIS — G4734 Idiopathic sleep related nonobstructive alveolar hypoventilation: Secondary | ICD-10-CM

## 2020-01-31 ENCOUNTER — Other Ambulatory Visit: Payer: Self-pay | Admitting: Physician Assistant

## 2020-01-31 ENCOUNTER — Other Ambulatory Visit: Payer: Self-pay | Admitting: *Deleted

## 2020-01-31 DIAGNOSIS — M2550 Pain in unspecified joint: Secondary | ICD-10-CM

## 2020-01-31 DIAGNOSIS — R5383 Other fatigue: Secondary | ICD-10-CM | POA: Diagnosis not present

## 2020-01-31 DIAGNOSIS — E785 Hyperlipidemia, unspecified: Secondary | ICD-10-CM

## 2020-02-01 MED ORDER — ATORVASTATIN CALCIUM 80 MG PO TABS: 80.0000 mg | ORAL_TABLET | Freq: Every day | ORAL | 1 refills | Status: DC

## 2020-02-04 LAB — 14-3-3 ETA PROTEIN: 14-3-3 eta Protein: <0.2 ng/mL (ref <0.2)

## 2020-02-04 LAB — URIC ACID: Uric Acid, Serum: 5 mg/dL (ref 2.5–7.0)

## 2020-02-04 LAB — SEDIMENTATION RATE: Sed Rate: 29 mm/h (ref 0–30)

## 2020-02-04 LAB — CK: Total CK: 79 U/L (ref 29–143)

## 2020-02-06 NOTE — Progress Notes (Signed)
14-3-3 eta negtaive.  CK WNL.  ESR WNL.  Uric acid WNL

## 2020-02-17 DIAGNOSIS — H5213 Myopia, bilateral: Secondary | ICD-10-CM | POA: Diagnosis not present

## 2020-02-20 ENCOUNTER — Telehealth: Payer: Self-pay

## 2020-02-20 ENCOUNTER — Telehealth: Payer: Self-pay | Admitting: *Deleted

## 2020-02-20 DIAGNOSIS — J449 Chronic obstructive pulmonary disease, unspecified: Secondary | ICD-10-CM

## 2020-02-20 MED ORDER — FLUTICASONE-SALMETEROL 100-50 MCG/DOSE IN AEPB: 1.0000 | INHALATION_SPRAY | Freq: Every day | RESPIRATORY_TRACT | 0 refills | Status: DC

## 2020-02-20 NOTE — Telephone Encounter (Signed)
Message sent to Dr. Sheppard Coil to consider a change in medication.

## 2020-02-20 NOTE — Telephone Encounter (Signed)
Requesting PA on   TRELEGY ELLIPTA 100-62.5-25 MCG/INH AEPB.  Please call pt back.

## 2020-02-20 NOTE — Telephone Encounter (Signed)
Trelegy is not covered by Medicaid,  The patient will need to try and fail Advair, Symbicort or Dulera.  Message to be sent ti Dr. Sheppard Coil to consider a change of medication.  Sander Nephew, RN 02/20/2020 11:14 AM

## 2020-02-23 ENCOUNTER — Telehealth: Payer: Self-pay

## 2020-02-23 NOTE — Telephone Encounter (Signed)
Received TC from pt, c/o not getting her Trelogy.  This RN read notes regarding the PA refusal dated 02/20/20 per Richardo Hanks which states "Trelogy is not covered by medicaid.  The patient will need to try and fail Advair, Symbicort or Dulera".  Pt states she has already tried and failed both Advair and Symbicort and that Trelogy was the only med working for her. Pt would like to speak to PA nurse.  Will forward request to G. Herbin,RN SChaplin, RN,BSN

## 2020-02-26 NOTE — Progress Notes (Signed)
Office Visit Note  Patient: [name]             Date of Birth: 25-Dec-1961           MRN: [id number]             PCP: Earlene Plater, MD Referring: Earlene Plater, MD Visit Date: 03/02/2020 Occupation: '@GUAROCC' @  Subjective:  Pain in multiple joints.   History of Present Illness: [name] is a 59 y.o. female with history of polyarthralgia.  She returns today after her initial televisit.  She states she continues to have pain and discomfort in her left shoulder, bilateral hands, left hip and her lower back.  She notices swelling in her both hands.  She has difficulty lifting her left shoulder.  She has increased pain if she lifts beyond 90 degrees.  Her bilateral replaced knees are doing fine.  She also has left hip pain which has been going on for a long time.  She states her hand pain and swelling started in September which is gradually gotten worse to the point she cannot make a fist.  Activities of Daily Living:  Patient reports morning stiffness for 24 hours.   Patient Reports nocturnal pain.  Difficulty dressing/grooming: Reports Difficulty climbing stairs: Reports Difficulty getting out of chair: Reports Difficulty using hands for taps, buttons, cutlery, and/or writing: Reports  Review of Systems  Constitutional: Negative for fatigue, night sweats, weight gain and weight loss.  HENT: Negative for mouth sores, trouble swallowing, trouble swallowing, mouth dryness and nose dryness.   Eyes: Positive for dryness. Negative for pain, redness, itching and visual disturbance.  Respiratory: Positive for shortness of breath. Negative for cough and difficulty breathing.        COPD  Cardiovascular: Negative for chest pain, palpitations, hypertension, irregular heartbeat and swelling in legs/feet.  Gastrointestinal: Negative for blood in stool, constipation and diarrhea.  Endocrine: Negative for increased urination.  Genitourinary: Negative for difficulty  urinating, painful urination and vaginal dryness.  Musculoskeletal: Positive for arthralgias, gait problem, joint pain, joint swelling and morning stiffness. Negative for myalgias, muscle weakness, muscle tenderness and myalgias.  Skin: Negative for color change, rash, hair loss, skin tightness, ulcers and sensitivity to sunlight.  Allergic/Immunologic: Negative for susceptible to infections.  Neurological: Negative for dizziness, headaches, memory loss, night sweats and weakness.  Hematological: Positive for bruising/bleeding tendency. Negative for swollen glands.  Psychiatric/Behavioral: Positive for sleep disturbance. Negative for depressed mood and confusion. The patient is not nervous/anxious.     PMFS History:  Patient Active Problem List   Diagnosis Date Noted  . Stiffness of hand joint 06/09/2019  . Right arm numbness 06/09/2019  . Low back pain 05/02/2019  . Pain of left hip joint 08/09/2018  . Malignant neoplasm of upper-outer quadrant of right breast in female, estrogen receptor positive ([place]) 06/10/2018  . Screening for colon cancer 03/12/2018  . Screening for breast cancer 03/12/2018  . Restless leg syndrome 02/04/2018  . 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 Depression 08/06/2015  . 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 Hypertension associated with diabetes ([place])   . Aortic stenosis   . Morbid obesity with body mass index of 60.0-69.9 in adult [facility])   . Chronic diastolic congestive heart failure ([place])   . COPD (chronic obstructive pulmonary disease) ([place]) 09/21/2013  . Obstructive sleep apnea 09/21/2013  . Nocturnal hypoxemia 09/12/2013  . Abnormal CT scan, chest 05/12/2013  . Hyperlipidemia 11/30/2012  .  History of pulmonary embolus (PE) 07/09/2012  . Long term (current) use of anticoagulants 02/20/2012  . Normocytic anemia 02/08/2012  . Type 2 diabetes mellitus without complication, without long-term  current use of insulin ([place]) 02/08/2012  . Asthma 02/08/2012  . GERD (gastroesophageal reflux disease)   . CAD (coronary artery disease) 12/08/2011    Past Medical History:  Diagnosis Date  . Anemia   . Aortic valve stenosis, severe   . 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 Asthma   . Blood transfusion    2013[facility]  . Breast cancer in female [facility])    Right  . CAD (coronary artery disease)    Cath 2010 with DES x 1 RCA-- PT'S CARDIOLOGIST IS DR. [place]  . Chronic diastolic congestive heart failure ([place])   . COPD (chronic obstructive pulmonary disease) ([place])   . Depression   . Diabetes mellitus DIAGNOSED IN2010   Controll s with diet  . Eczema    on back  . Hyperlipidemia   . Hypertension   . Kidney stones   . Morbid obesity with body mass index of 60.0-69.9 in adult [facility])   . Myocardial infarction ([place])    PT THINKS SHE WAS DX WITH MI AT THE TIME OF HEART STENTING  . Pulmonary embolism ([place]) 02/08/12   S/P RT TOTAL KNEE ON 02/03/12--ON 02/08/12--DEVELOPED ACUTE SOB AND CHEST PAIN--AND DIAGNOSED WITH  PULMONARY EMBOLUS AND PNEUMONIA  . Restless leg syndrome   . Sleep apnea    uses 3 liters O2 at night   . Uterine fibroid    NO PROBLEMS AT PRESENT FROM THE FIBROIDS-STATES SHE IS POST MENOPAUSAL-LAST MENSES 2010 EXCEPT FOR EPISODE THIS YR OF BLEEDING RELATED TO FIBROIDS.  Marland Kitchen Weakness    BOTH HANDS - S/P BILATERAL CARPAL TUNNEL RELEASE--BUT STILL HAS WEAKNESS--OFTEN DROPS THINGS    Family History  Problem Relation Age of Onset  . Breast cancer Mother        stage IV at diagnosis  . Emphysema Mother        smoked  . Heart disease Mother   . COPD Father        smoked  . Asthma Father   . Heart disease Father   . Cancer Brother        Sinus   Past Surgical History:  Procedure Laterality Date  . BREAST LUMPECTOMY WITH RADIOACTIVE SEED AND SENTINEL LYMPH NODE BIOPSY  Right 07/19/2018   Procedure: BREAST LUMPECTOMY WITH RADIOACTIVE SEED AND SENTINEL LYMPH NODE BIOPSY;  Surgeon: Alphonsa Overall, MD;  Location: [place];  Service: General;  Laterality: Right;  . CARDIAC CATHETERIZATION    . CARPAL TUNNEL RELEASE     Bilateral  . CHOLECYSTECTOMY    . CORONARY ANGIOPLASTY     2010 has stent in place  . CYSTOSCOPY W/ RETROGRADES Right 09/21/2013   Procedure: CYSTOSCOPY WITH RIGHT RETROGRADE PYELOGRAM RIGHT DOUBLE J STENT ;  Surgeon: Fredricka Bonine, MD;  [facility];  Service: Urology;  Laterality: Right;  . CYSTOSCOPY WITH URETEROSCOPY AND STENT PLACEMENT Right 10/25/2013   Procedure: CYSTOSCOPY RIGHT URETEROSCOPY HOLMIUM LASER LITHO AND STENT PLACEMENT;  Surgeon: Fredricka Bonine, MD;  [facility];  Service: Urology;  Laterality: Right;  . HERNIA REPAIR    . INTRAOPERATIVE TRANSESOPHAGEAL ECHOCARDIOGRAM N/A 12/12/2014   Procedure: INTRAOPERATIVE TRANSESOPHAGEAL ECHOCARDIOGRAM;  Surgeon: Annita Brod  Angelena Form, MD;  Location: [place] [facility];  Service: Cardiovascular;  Laterality: N/A;  . JOINT REPLACEMENT     bil total knees  . KNEE ARTHROPLASTY  02/03/2012   Procedure: COMPUTER ASSISTED TOTAL KNEE ARTHROPLASTY;  Surgeon: Mcarthur Rossetti, MD;  Location: [place];  Service: Orthopedics;  Laterality: Right;  Right total knee arthroplasty  . LEFT AND RIGHT HEART CATHETERIZATION WITH CORONARY ANGIOGRAM N/A 03/17/2013   Procedure: LEFT AND RIGHT HEART CATHETERIZATION WITH CORONARY ANGIOGRAM;  Surgeon: Burnell Blanks, MD;  Location: [facility] CATH LAB;  Service: Cardiovascular;  Laterality: N/A;  . LEFT AND RIGHT HEART CATHETERIZATION WITH CORONARY/GRAFT ANGIOGRAM N/A 09/14/2014   Procedure: LEFT AND RIGHT HEART CATHETERIZATION WITH Beatrix Fetters;  Surgeon: Burnell Blanks, MD;  Location: [facility] C[facility] LAB;  Service: Cardiovascular;  Laterality: N/A;  . TEE WITHOUT CARDIOVERSION N/A 03/14/2013   Procedure: TRANSESOPHAGEAL ECHOCARDIOGRAM (TEE);   Surgeon: Lelon Perla, MD;  Location: [facility] ENDOSCOPY;  Service: Cardiovascular;  Laterality: N/A;  . TEE WITHOUT CARDIOVERSION N/A 11/14/2014   Procedure: TRANSESOPHAGEAL ECHOCARDIOGRAM (TEE);  Surgeon: Lelon Perla, MD;  Location: [facility] ENDOSCOPY;  Service: Cardiovascular;  Laterality: N/A;  . TOTAL KNEE ARTHROPLASTY  09/10/2012   Procedure: TOTAL KNEE ARTHROPLASTY;  Surgeon: Mcarthur Rossetti, MD;  [facility];  Service: Orthopedics;  Laterality: Left;  . TOTAL KNEE REVISION Right 07/15/2013   Procedure: REVISION ARTHROPLASTY RIGHT KNEE;  Surgeon: Mcarthur Rossetti, MD;  [facility];  Service: Orthopedics;  Laterality: Right;  . TRANSCATHETER AORTIC VALVE REPLACEMENT, TRANSFEMORAL N/A 12/12/2014   Procedure: TRANSCATHETER AORTIC VALVE REPLACEMENT, TRANSFEMORAL;  Surgeon: Burnell Blanks, MD;  Location: [place];  Service: Cardiovascular;  Laterality: N/A;  . TRIGGER FINGER RELEASE  09/10/2012   Procedure: RELEASE TRIGGER FINGER/A-1 PULLEY;  Surgeon: Mcarthur Rossetti, MD;  [facility];  Service: Orthopedics;  Laterality: Right;  Right Ring Finger  . TUBAL LIGATION     Social History   Social History Narrative  . Not on file   Immunization History  Administered Date(s) Administered  . Influenza Split 09/14/2012, 09/28/2014  . Influenza,inj,Quad PF,6+ Mos 02/09/2018, 09/27/2018  . Pneumococcal Polysaccharide-23 02/06/2012, 03/12/2018  . Tdap 03/12/2018     Objective: Vital Signs: BP (!) 150/93 (BP Location: Left Wrist, Patient Position: Sitting, Cuff Size: Normal)   Pulse (!) 103   Resp 18   Ht 5' 2.5" (1.588 m)   Wt (!) 303 lb (137.4 kg)   LMP 02/03/2012   BMI 54.54 kg/m    Physical Exam Vitals and nursing note reviewed.  Constitutional:      Appearance: She is well-developed.  HENT:     Head: Normocephalic and atraumatic.  Eyes:     Conjunctiva/sclera: Conjunctivae normal.  Cardiovascular:     Rate and Rhythm: Normal rate and regular  rhythm.     Heart sounds: Normal heart sounds.  Pulmonary:     Effort: Pulmonary effort is normal.     Breath sounds: Normal breath sounds.  Abdominal:     General: Bowel sounds are normal.     Palpations: Abdomen is soft.  Musculoskeletal:     Cervical back: Normal range of motion.  Lymphadenopathy:     Cervical: No cervical adenopathy.  Skin:    General: Skin is warm and dry.     Capillary Refill: Capillary refill takes less than 2 seconds.  Neurological:     Mental Status: She is alert and oriented to person, place, and time.  Psychiatric:  Behavior: Behavior normal.      Musculoskeletal Exam: C-spine was in good range of motion.  She has painful abduction of her left shoulder joint which  is limited to current 40 degrees.  Elbow joints with good range of motion.  She has no tenderness over MCPs PIPs or DIPs.  Although she is unable to complete her fist bilaterally.  No obvious synovitis was noted.  Her hands are puffy.  She had discomfort range of motion of her left hip joint.  Bilateral knee joints with good range of motion.  No tenderness over her MTPs was noted.  CDAI Exam: CDAI Score: -- Patient Global: --; Provider Global: -- Swollen: --; Tender: -- Joint Exam 03/02/2020   No joint exam has been documented for this visit   There is currently no information documented on the homunculus. Go to the Rheumatology activity and complete the homunculus joint exam.  Investigation: No additional findings.  Imaging: XR Hand 2 View Left  Result Date: 03/02/2020 PIP and DIP narrowing was noted.  No MCP, intercarpal radiocarpal joint space narrowing was noted.  CMC narrowing was noted.  No erosive changes were noted. Impression: These findings are consistent with osteoarthritis of the hand.  XR Hand 2 View Right  Result Date: 03/02/2020 PIP and DIP narrowing was noted.  No significant MCP, intercarpal radiocarpal joint space narrowing was noted.  Some cystic changes were  noted around the [facility] joints.  Severe CMC narrowing was noted. Impression: These findings are consistent with osteoarthritis of the hand.  XR Shoulder Left  Result Date: 03/02/2020 Spurring was noted in the humeral head.  Acromioclavicular arthritis was noted.  Humerus appears to be inferiorly displaced.  It raises concern about rotator cuff tear. Impression: Osteoarthritic changes were noted.  Humerus appears to be inferiorly displaced.   Recent Labs: Lab Results  Component Value Date   WBC 9.4 07/14/2019   HGB 13.1 07/14/2019   PLT 260 07/14/2019   NA 137 07/14/2019   K 4.1 07/14/2019   CL 100 07/14/2019   CO2 26 07/14/2019   GLUCOSE 137 (H) 07/14/2019   BUN 12 07/14/2019   CREATININE 0.84 07/14/2019   BILITOT 1.3 (H) 07/14/2019   ALKPHOS 78 07/14/2019   AST 14 (L) 07/14/2019   ALT 13 07/14/2019   PROT 7.4 07/14/2019   ALBUMIN 3.5 07/14/2019   CALCIUM 9.5 07/14/2019   GFRAA >60 07/14/2019   01/31/20 ESR 29, '14 3 3 ' eta negative, uric acid 5.0, CK 79  06/09/19: ANA-, ENA-, RF-, CCP-  Speciality Comments: No specialty comments available.  Procedures:  No procedures performed Allergies: Patient has no known allergies.   Assessment / Plan:     Visit Diagnoses: Polyarthralgia - For many years.  All autoimmune work-up is negative so far.  Chronic left shoulder pain -she complains of discomfort in left shoulder and has difficulty raising her left arm. . - Plan: XR Shoulder Left.  Patient has painful range of motion of her left shoulder joint.  Her humerus appears to be inferiorly displaced.  I will obtain MRI of the left shoulder joint to rule out rotator cuff tear.  Pain in both hands -patient states her symptoms started in September 2020.  She is having difficulty making a fist bilaterally.  Plan: XR Hand 2 View Right, XR Hand 2 View Left.  X-ray of bilateral hands were consistent with osteoarthritis.  Although she is having difficulty making a fist and gives history of  intermittent swelling.  I will schedule  ultrasound of her bilateral hands to look for synovitis.  Primary osteoarthritis of both hands-clinical findings are consistent with osteoarthritis.  Primary osteoarthritis of left hip - Mild osteoarthritis noted on the x-ray from August 2019.  Status post total knee replacement, bilateral-doing well.  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  Nonrheumatic aortic valve stenosis  Chronic diastolic congestive heart failure (HCC)  Coronary artery disease involving native coronary artery of native heart without angina pectoris  History of pulmonary embolus (PE)  Long term (current) use of anticoagulants  History of hyperlipidemia  Type 2 diabetes mellitus without complication, without long-term current use of insulin (HCC)  Gastroesophageal reflux disease without esophagitis  Chronic obstructive pulmonary disease with acute exacerbation (HCC)  Obstructive sleep apnea  Nocturnal hypoxemia  History of depression    Orders: Orders Placed This Encounter  Procedures  . XR Hand 2 View Right  . XR Hand 2 View Left  . XR Shoulder Left  . MR SHOULDER LEFT WO CONTRAST   No orders of the defined types were placed in this encounter.   Face-to-face time spent with patient was 40 minutes. Greater than 50% of time was spent in counseling and coordination of care.  Follow-Up Instructions: Return for Osteoarthritis.   Bo Merino, MD  Note - This record has been created using Editor, commissioning.  Chart creation errors have been sought, but may not always  have been located. Such creation errors do not reflect on  the standard of medical care.

## 2020-02-28 ENCOUNTER — Telehealth: Payer: Self-pay | Admitting: *Deleted

## 2020-02-28 NOTE — Telephone Encounter (Addendum)
PA sent to [employer] for PA for Trelegy.  Awaiting determination.  Conf# [id number] Nelva Nay, RN 02/28/2020 11:47 AM.  PA was approved 02/28/2020 thru 02/27/2021.  PA # [id number].  Patient was called and informed of the approval.  Sander Nephew, RN 02/28/2020 12:14 PM.

## 2020-02-28 NOTE — Telephone Encounter (Signed)
Resent request for PA with additional information about previous meds used.

## 2020-02-29 DIAGNOSIS — H0102B Squamous blepharitis left eye, upper and lower eyelids: Secondary | ICD-10-CM | POA: Diagnosis not present

## 2020-02-29 DIAGNOSIS — H01131 Eczematous dermatitis of right upper eyelid: Secondary | ICD-10-CM | POA: Diagnosis not present

## 2020-02-29 DIAGNOSIS — H01135 Eczematous dermatitis of left lower eyelid: Secondary | ICD-10-CM | POA: Diagnosis not present

## 2020-02-29 DIAGNOSIS — H0102A Squamous blepharitis right eye, upper and lower eyelids: Secondary | ICD-10-CM | POA: Diagnosis not present

## 2020-02-29 DIAGNOSIS — H02054 Trichiasis without entropian left upper eyelid: Secondary | ICD-10-CM | POA: Diagnosis not present

## 2020-02-29 DIAGNOSIS — H01134 Eczematous dermatitis of left upper eyelid: Secondary | ICD-10-CM | POA: Diagnosis not present

## 2020-02-29 DIAGNOSIS — H01132 Eczematous dermatitis of right lower eyelid: Secondary | ICD-10-CM | POA: Diagnosis not present

## 2020-03-02 ENCOUNTER — Ambulatory Visit: Payer: Self-pay

## 2020-03-02 ENCOUNTER — Encounter: Payer: Self-pay | Admitting: Rheumatology

## 2020-03-02 ENCOUNTER — Other Ambulatory Visit: Payer: Self-pay

## 2020-03-02 ENCOUNTER — Ambulatory Visit: Payer: Medicaid Other | Admitting: Rheumatology

## 2020-03-02 VITALS — BP 150/93 | HR 103 | Resp 18 | Ht 62.5 in | Wt 303.0 lb

## 2020-03-02 DIAGNOSIS — C50411 Malignant neoplasm of upper-outer quadrant of right female breast: Secondary | ICD-10-CM | POA: Diagnosis not present

## 2020-03-02 DIAGNOSIS — K219 Gastro-esophageal reflux disease without esophagitis: Secondary | ICD-10-CM

## 2020-03-02 DIAGNOSIS — Z96653 Presence of artificial knee joint, bilateral: Secondary | ICD-10-CM | POA: Diagnosis not present

## 2020-03-02 DIAGNOSIS — M1612 Unilateral primary osteoarthritis, left hip: Secondary | ICD-10-CM

## 2020-03-02 DIAGNOSIS — Z8639 Personal history of other endocrine, nutritional and metabolic disease: Secondary | ICD-10-CM

## 2020-03-02 DIAGNOSIS — J441 Chronic obstructive pulmonary disease with (acute) exacerbation: Secondary | ICD-10-CM

## 2020-03-02 DIAGNOSIS — I5032 Chronic diastolic (congestive) heart failure: Secondary | ICD-10-CM | POA: Diagnosis not present

## 2020-03-02 DIAGNOSIS — M2550 Pain in unspecified joint: Secondary | ICD-10-CM

## 2020-03-02 DIAGNOSIS — M19042 Primary osteoarthritis, left hand: Secondary | ICD-10-CM

## 2020-03-02 DIAGNOSIS — E119 Type 2 diabetes mellitus without complications: Secondary | ICD-10-CM

## 2020-03-02 DIAGNOSIS — M79641 Pain in right hand: Secondary | ICD-10-CM

## 2020-03-02 DIAGNOSIS — M79642 Pain in left hand: Secondary | ICD-10-CM | POA: Diagnosis not present

## 2020-03-02 DIAGNOSIS — Z170 Estrogen receptor positive status [ER+]: Secondary | ICD-10-CM

## 2020-03-02 DIAGNOSIS — M25512 Pain in left shoulder: Secondary | ICD-10-CM | POA: Diagnosis not present

## 2020-03-02 DIAGNOSIS — I2510 Atherosclerotic heart disease of native coronary artery without angina pectoris: Secondary | ICD-10-CM | POA: Diagnosis not present

## 2020-03-02 DIAGNOSIS — Z953 Presence of xenogenic heart valve: Secondary | ICD-10-CM | POA: Diagnosis not present

## 2020-03-02 DIAGNOSIS — I350 Nonrheumatic aortic (valve) stenosis: Secondary | ICD-10-CM | POA: Diagnosis not present

## 2020-03-02 DIAGNOSIS — Z8659 Personal history of other mental and behavioral disorders: Secondary | ICD-10-CM

## 2020-03-02 DIAGNOSIS — Z86711 Personal history of pulmonary embolism: Secondary | ICD-10-CM

## 2020-03-02 DIAGNOSIS — G8929 Other chronic pain: Secondary | ICD-10-CM

## 2020-03-02 DIAGNOSIS — Z7901 Long term (current) use of anticoagulants: Secondary | ICD-10-CM

## 2020-03-02 DIAGNOSIS — G4733 Obstructive sleep apnea (adult) (pediatric): Secondary | ICD-10-CM

## 2020-03-02 DIAGNOSIS — G4734 Idiopathic sleep related nonobstructive alveolar hypoventilation: Secondary | ICD-10-CM

## 2020-03-02 DIAGNOSIS — M19041 Primary osteoarthritis, right hand: Secondary | ICD-10-CM

## 2020-03-06 ENCOUNTER — Other Ambulatory Visit: Payer: Self-pay | Admitting: *Deleted

## 2020-03-06 DIAGNOSIS — E785 Hyperlipidemia, unspecified: Secondary | ICD-10-CM

## 2020-03-06 MED ORDER — EZETIMIBE 10 MG PO TABS: 10.0000 mg | ORAL_TABLET | Freq: Every day | ORAL | 1 refills | Status: DC

## 2020-03-15 ENCOUNTER — Telehealth: Payer: Self-pay | Admitting: Rheumatology

## 2020-03-15 DIAGNOSIS — M25512 Pain in left shoulder: Secondary | ICD-10-CM

## 2020-03-15 DIAGNOSIS — E1169 Type 2 diabetes mellitus with other specified complication: Secondary | ICD-10-CM | POA: Diagnosis not present

## 2020-03-15 DIAGNOSIS — E669 Obesity, unspecified: Secondary | ICD-10-CM | POA: Diagnosis not present

## 2020-03-15 DIAGNOSIS — Z170 Estrogen receptor positive status [ER+]: Secondary | ICD-10-CM | POA: Diagnosis not present

## 2020-03-15 DIAGNOSIS — Z7901 Long term (current) use of anticoagulants: Secondary | ICD-10-CM | POA: Diagnosis not present

## 2020-03-15 DIAGNOSIS — C50911 Malignant neoplasm of unspecified site of right female breast: Secondary | ICD-10-CM | POA: Diagnosis not present

## 2020-03-15 DIAGNOSIS — G8929 Other chronic pain: Secondary | ICD-10-CM

## 2020-03-15 NOTE — Telephone Encounter (Signed)
Please advise patient as MRI is not covered we will refer her to orthopedics.  Make a referral to orthopedics.

## 2020-03-15 NOTE — Telephone Encounter (Signed)
Patient advised as MRI is not covered we will refer her to orthopedics. Referral placed.

## 2020-03-15 NOTE — Telephone Encounter (Signed)
FYI: Medicaid denied request for shoulder MRI stating follow up contact must be established confirming a failed recent six-week trial of doctor prescribed treatment. Treatment include (but not limited to) medications for swelling or pain, physical therapy, and/or oral or injected steroids.  What would you like to do at this point?

## 2020-03-19 ENCOUNTER — Other Ambulatory Visit: Payer: Self-pay | Admitting: Surgery

## 2020-03-19 ENCOUNTER — Other Ambulatory Visit: Payer: Self-pay

## 2020-03-19 DIAGNOSIS — C50911 Malignant neoplasm of unspecified site of right female breast: Secondary | ICD-10-CM

## 2020-03-19 DIAGNOSIS — Z170 Estrogen receptor positive status [ER+]: Secondary | ICD-10-CM

## 2020-03-20 DIAGNOSIS — H524 Presbyopia: Secondary | ICD-10-CM | POA: Diagnosis not present

## 2020-03-21 ENCOUNTER — Encounter: Payer: Self-pay | Admitting: Physician Assistant

## 2020-03-21 ENCOUNTER — Ambulatory Visit (INDEPENDENT_AMBULATORY_CARE_PROVIDER_SITE_OTHER): Payer: Medicaid Other | Admitting: Physician Assistant

## 2020-03-21 ENCOUNTER — Other Ambulatory Visit: Payer: Self-pay

## 2020-03-21 DIAGNOSIS — M25552 Pain in left hip: Secondary | ICD-10-CM | POA: Diagnosis not present

## 2020-03-21 DIAGNOSIS — M7542 Impingement syndrome of left shoulder: Secondary | ICD-10-CM | POA: Diagnosis not present

## 2020-03-21 MED ORDER — METHYLPREDNISOLONE ACETATE 40 MG/ML IJ SUSP
40.0000 mg | INTRAMUSCULAR | Status: AC | PRN
  Administered 2020-03-21: 40 mg via INTRA_ARTICULAR

## 2020-03-21 MED ORDER — LIDOCAINE HCL 1 % IJ SOLN
3.0000 mL | INTRAMUSCULAR | Status: AC | PRN
  Administered 2020-03-21: 3 mL

## 2020-03-21 NOTE — Progress Notes (Signed)
Office Visit Note   Patient: [name]           Date of Birth: 12-30-1960           MRN: [id number] Visit Date: 03/21/2020              Requested by: Bo Merino, MD [address] [place],  [place] [id number] PCP: Earlene Plater, MD   Assessment & Plan: Visit Diagnoses:  1. Impingement syndrome of left shoulder   2. Pain in left hip     Plan: We will send her to physical therapy to work on range of motion strengthening shoulder.  Have her follow-up after in approximately 3 weeks to see what type of response she had to the injection therapy.  She may benefit from [name] intra-articular injection of the shoulder.  Questions are encouraged and answered at length.  Follow-Up Instructions: Return in about 3 weeks (around 04/11/2020).   Orders:  Orders Placed This Encounter  Procedures  . Large Joint Inj   No orders of the defined types were placed in this encounter.     Procedures: Large Joint Inj: L subacromial bursa on 03/21/2020 4:15 PM Indications: pain Details: 22 G 1.5 in needle, superior approach  Arthrogram: No  Medications: 3 mL lidocaine 1 %; 40 mg methylPREDNISolone acetate 40 MG/ML Outcome: tolerated well, no immediate complications Procedure, treatment alternatives, risks and benefits explained, specific risks discussed. Consent was given by the patient. Immediately prior to procedure [name] time out was called to verify the correct patient, procedure, equipment, support staff and site/side marked as required. Patient was prepped and draped in the usual sterile fashion.       Clinical Data: No additional findings.   Subjective: Chief Complaint  Patient presents with  . Left Shoulder - Pain    HPI  Mrs. [name] is [name] well-known to Dr. Trevor Mace service.  However it has been several years since we last seen her.  She did see Dr. Estanislado Pandy for her left shoulder which is been bothering her for over [name] year.  I did have  [name] short order an MRI however this was denied by insurance due to the fact the patient had not had any physical therapy.  She has had no new injury to the left shoulder.  Pains been ongoing for at least [name] year.  She states having no numbness tingling down the left arm.  States her range of motion and strength are getting worse in regards to left shoulder.  Pain is worse with overhead activity.  Pain does awaken her.  She is diabetic reports her hemoglobin A1c to be around 6.  Also she is having left hip pain states her pain is in the joint area.  She points to the lateral aspect of the hip and buttocks region.  She denies any numbness tingling down the leg.  Denies any injury to the left hip. Radiographs left shoulder dated 03/02/2020 are reviewed.  Glenohumeral joint narrowing more consistent with arthritic changes.  Humerus appears to be inferiorly displaced.  Type II acromium.  No acute fractures.  Findings were consistent with [name] degenerative shoulder. Review of Systems Please see HPI otherwise negative  Objective: Vital Signs: LMP 02/03/2012   Physical Exam Constitutional:      Appearance: She is not ill-appearing or diaphoretic.  Pulmonary:     Effort: Pulmonary effort is normal.  Neurological:     Mental Status: She is alert and oriented to person, place,  and time.  Psychiatric:        Mood and Affect: Mood normal.     Ortho Exam Left shoulder weakness with external rotation against resistance.  Otherwise, the 5 strength bilateral shoulders with external/internal rotation.  Empty can test negative bilaterally.  Liftoff test positive on the right.  Lacks the last 30 to 40 degrees of forward flexion left shoulder full forward flexion of the right shoulder.  Positive impingement on the right shoulder. Specialty Comments:  No specialty comments available.  Imaging: No results found.   PMFS History: Patient Active Problem List   Diagnosis Date Noted  . Stiffness of hand joint 06/09/2019   . Right arm numbness 06/09/2019  . Low back pain 05/02/2019  . Pain of left hip joint 08/09/2018  . 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 Screening for colon cancer 03/12/2018  . Screening for breast cancer 03/12/2018  . Restless leg syndrome 02/04/2018  . 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 Depression 08/06/2015  . 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 Hypertension associated with diabetes ([place])   . Aortic stenosis   . Morbid obesity with body mass index of 60.0-69.9 in adult [facility])   . Chronic diastolic congestive heart failure ([place])   . COPD (chronic obstructive pulmonary disease) ([place]) 09/21/2013  . Obstructive sleep apnea 09/21/2013  . Nocturnal hypoxemia 09/12/2013  . Abnormal CT scan, chest 05/12/2013  . Hyperlipidemia 11/30/2012  . History of pulmonary embolus (PE) 07/09/2012  . Long term (current) use of anticoagulants 02/20/2012  . Normocytic anemia 02/08/2012  . Type 2 diabetes mellitus without complication, without long-term current use of insulin ([place]) 02/08/2012  . Asthma 02/08/2012  . GERD (gastroesophageal reflux disease)   . CAD (coronary artery disease) 12/08/2011   Past Medical History:  Diagnosis Date  . Anemia   . Aortic valve stenosis, severe   . 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 Asthma   . Blood transfusion    2013[facility]  . Breast cancer in [name] [facility])    Right  . CAD (coronary artery disease)    Cath 2010 with DES x 1 RCA-- PT'S CARDIOLOGIST IS DR. Hernando  . Chronic diastolic congestive heart failure ([place])   . COPD (chronic obstructive pulmonary disease) ([place])   . Depression   . Diabetes mellitus DIAGNOSED IN2010   Controll s with diet  . Eczema    on back  . Hyperlipidemia   . Hypertension     . Kidney stones   . Morbid obesity with body mass index of 60.0-69.9 in adult [facility])   . Myocardial infarction ([place])    PT THINKS SHE WAS DX WITH MI AT THE TIME OF HEART STENTING  . Pulmonary embolism ([place]) 02/08/12   S/P RT TOTAL KNEE ON 02/03/12--ON 02/08/12--DEVELOPED ACUTE SOB AND CHEST PAIN--AND DIAGNOSED WITH  PULMONARY EMBOLUS AND PNEUMONIA  . Restless leg syndrome   . Sleep apnea    uses 3 liters O2 at night   . Uterine fibroid    NO PROBLEMS AT PRESENT FROM THE FIBROIDS-STATES SHE IS POST MENOPAUSAL-LAST MENSES 2010 EXCEPT FOR EPISODE THIS YR OF BLEEDING RELATED TO FIBROIDS.  Marland Kitchen Weakness    BOTH HANDS - S/P BILATERAL CARPAL TUNNEL RELEASE--BUT STILL HAS WEAKNESS--OFTEN DROPS THINGS  Family History  Problem Relation Age of Onset  . Breast cancer Mother        stage IV at diagnosis  . Emphysema Mother        smoked  . Heart disease Mother   . COPD Father        smoked  . Asthma Father   . Heart disease Father   . Cancer Brother        Sinus    Past Surgical History:  Procedure Laterality Date  . BREAST LUMPECTOMY WITH RADIOACTIVE SEED AND SENTINEL LYMPH NODE BIOPSY Right 07/19/2018   Procedure: BREAST LUMPECTOMY WITH RADIOACTIVE SEED AND SENTINEL LYMPH NODE BIOPSY;  Surgeon: Alphonsa Overall, MD;  Location: [place];  Service: General;  Laterality: Right;  . CARDIAC CATHETERIZATION    . CARPAL TUNNEL RELEASE     Bilateral  . CHOLECYSTECTOMY    . CORONARY ANGIOPLASTY     2010 has stent in place  . CYSTOSCOPY W/ RETROGRADES Right 09/21/2013   Procedure: CYSTOSCOPY WITH RIGHT RETROGRADE PYELOGRAM RIGHT DOUBLE J STENT ;  Surgeon: Fredricka Bonine, MD;  Location: [facility];  Service: Urology;  Laterality: Right;  . CYSTOSCOPY WITH URETEROSCOPY AND STENT PLACEMENT Right 10/25/2013   Procedure: CYSTOSCOPY RIGHT URETEROSCOPY HOLMIUM LASER LITHO AND STENT PLACEMENT;  Surgeon: Fredricka Bonine, MD;  Location: [facility];  Service: Urology;  Laterality: Right;  . HERNIA REPAIR     . INTRAOPERATIVE TRANSESOPHAGEAL ECHOCARDIOGRAM N/[name] 12/12/2014   Procedure: INTRAOPERATIVE TRANSESOPHAGEAL ECHOCARDIOGRAM;  Surgeon: Burnell Blanks, MD;  [facility];  Service: Cardiovascular;  Laterality: N/[name];  . JOINT REPLACEMENT     bil total knees  . KNEE ARTHROPLASTY  02/03/2012   Procedure: COMPUTER ASSISTED TOTAL KNEE ARTHROPLASTY;  Surgeon: Mcarthur Rossetti, MD;  Location: [place];  Service: Orthopedics;  Laterality: Right;  Right total knee arthroplasty  . LEFT AND RIGHT HEART CATHETERIZATION WITH CORONARY ANGIOGRAM N/[name] 03/17/2013   Procedure: LEFT AND RIGHT HEART CATHETERIZATION WITH CORONARY ANGIOGRAM;  Surgeon: Burnell Blanks, MD;  Location: [facility] CATH LAB;  Service: Cardiovascular;  Laterality: N/[name];  . LEFT AND RIGHT HEART CATHETERIZATION WITH CORONARY/GRAFT ANGIOGRAM N/[name] 09/14/2014   Procedure: LEFT AND RIGHT HEART CATHETERIZATION WITH Beatrix Fetters;  Surgeon: Burnell Blanks, MD;  Location: [facility] CATH LAB;  Service: Cardiovascular;  Laterality: N/[name];  . TEE WITHOUT CARDIOVERSION N/[name] 03/14/2013   Procedure: TRANSESOPHAGEAL ECHOCARDIOGRAM (TEE);  Surgeon: Lelon Perla, MD;  Location: [facility] ENDOSCOPY;  Service: Cardiovascular;  Laterality: N/[name];  . TEE WITHOUT CARDIOVERSION N/[name] 11/14/2014   Procedure: TRANSESOPHAGEAL ECHOCARDIOGRAM (TEE);  Surgeon: Lelon Perla, MD;  Location: [facility] ENDOSCOPY;  Service: Cardiovascular;  Laterality: N/[name];  . TOTAL KNEE ARTHROPLASTY  09/10/2012   Procedure: TOTAL KNEE ARTHROPLASTY;  Surgeon: Mcarthur Rossetti, MD;  Location: [facility];  Service: Orthopedics;  Laterality: Left;  . TOTAL KNEE REVISION Right 07/15/2013   Procedure: REVISION ARTHROPLASTY RIGHT KNEE;  Surgeon: Mcarthur Rossetti, MD;  Location: [facility];  Service: Orthopedics;  Laterality: Right;  . TRANSCATHETER AORTIC VALVE REPLACEMENT, TRANSFEMORAL N/[name] 12/12/2014   Procedure: TRANSCATHETER AORTIC VALVE REPLACEMENT, TRANSFEMORAL;  Surgeon: Burnell Blanks, MD;  Location: [place];  Service: Cardiovascular;  Laterality: N/[name];  . TRIGGER FINGER RELEASE  09/10/2012   Procedure: RELEASE TRIGGER FINGER/[name]-1 PULLEY;  Surgeon: Mcarthur Rossetti, MD;  Location: [facility];  Service: Orthopedics;  Laterality: Right;  Right Ring Finger  . TUBAL LIGATION     Social History   Occupational History  .  Occupation: Disabled  Tobacco Use  . Smoking status: Former Smoker    Packs/day: 1.50    Years: 30.00    Pack years: 45.00    Types: Cigarettes    Quit date: 12/29/2000    Years since quitting: 19.2  . Smokeless tobacco: Never Used  Substance and Sexual Activity  . Alcohol use: Yes    Comment: rarely  . Drug use: No  . Sexual activity: Not Currently    Birth control/protection: Surgical

## 2020-03-22 ENCOUNTER — Other Ambulatory Visit: Payer: Self-pay | Admitting: Radiology

## 2020-03-22 DIAGNOSIS — M7542 Impingement syndrome of left shoulder: Secondary | ICD-10-CM

## 2020-04-06 ENCOUNTER — Other Ambulatory Visit: Payer: Self-pay

## 2020-04-06 ENCOUNTER — Ambulatory Visit (INDEPENDENT_AMBULATORY_CARE_PROVIDER_SITE_OTHER): Payer: Medicaid Other | Admitting: Internal Medicine

## 2020-04-06 VITALS — BP 144/73 | HR 79 | Temp 98.2°F | Ht 61.0 in | Wt 308.3 lb

## 2020-04-06 DIAGNOSIS — S40861A Insect bite (nonvenomous) of right upper arm, initial encounter: Secondary | ICD-10-CM | POA: Diagnosis not present

## 2020-04-06 DIAGNOSIS — W57XXXA Bitten or stung by nonvenomous insect and other nonvenomous arthropods, initial encounter: Secondary | ICD-10-CM

## 2020-04-06 MED ORDER — DOXYCYCLINE MONOHYDRATE 100 MG PO TABS: 200.0000 mg | ORAL_TABLET | Freq: Once | ORAL | 0 refills | Status: AC

## 2020-04-06 MED ORDER — DOXYCYCLINE MONOHYDRATE 100 MG PO TABS: 200.0000 mg | ORAL_TABLET | Freq: Two times a day (BID) | ORAL | 0 refills | Status: DC

## 2020-04-06 NOTE — Progress Notes (Signed)
Internal Medicine Clinic Attending  Case discussed with Dr. Bloomfield at the time of the visit.  We reviewed the resident's history and exam and pertinent patient test results.  I agree with the assessment, diagnosis, and plan of care documented in the resident's note.  

## 2020-04-06 NOTE — Progress Notes (Signed)
Acute Office Visit  Subjective:    Patient ID: [name], female    DOB: Dec 23, 1961, 59 y.o.   MRN: [id number]  Chief Complaint  Patient presents with  . Insect Bite    RUE Bull's eye    HPI Patient is in today for a tick bite. Please see problem based charting for further details.   Past Medical History:  Diagnosis Date  . Anemia   . Aortic valve stenosis, severe   . 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 Asthma   . Blood transfusion    2013[facility]  . Breast cancer in female [facility])    Right  . CAD (coronary artery disease)    Cath 2010 with DES x 1 RCA-- PT'S CARDIOLOGIST IS DR. Baraboo  . Chronic diastolic congestive heart failure ([place])   . COPD (chronic obstructive pulmonary disease) ([place])   . Depression   . Diabetes mellitus DIAGNOSED IN2010   Controll s with diet  . Eczema    on back  . Hyperlipidemia   . Hypertension   . Kidney stones   . Morbid obesity with body mass index of 60.0-69.9 in adult [facility])   . Myocardial infarction ([place])    PT THINKS SHE WAS DX WITH MI AT THE TIME OF HEART STENTING  . Pulmonary embolism ([place]) 02/08/12   S/P RT TOTAL KNEE ON 02/03/12--ON 02/08/12--DEVELOPED ACUTE SOB AND CHEST PAIN--AND DIAGNOSED WITH  PULMONARY EMBOLUS AND PNEUMONIA  . Restless leg syndrome   . Sleep apnea    uses 3 liters O2 at night   . Uterine fibroid    NO PROBLEMS AT PRESENT FROM THE FIBROIDS-STATES SHE IS POST MENOPAUSAL-LAST MENSES 2010 EXCEPT FOR EPISODE THIS YR OF BLEEDING RELATED TO FIBROIDS.  Marland Kitchen Weakness    BOTH HANDS - S/P BILATERAL CARPAL TUNNEL RELEASE--BUT STILL HAS WEAKNESS--OFTEN DROPS THINGS    Past Surgical History:  Procedure Laterality Date  . BREAST LUMPECTOMY WITH RADIOACTIVE SEED AND SENTINEL LYMPH NODE BIOPSY Right 07/19/2018   Procedure: BREAST LUMPECTOMY WITH RADIOACTIVE SEED AND SENTINEL LYMPH NODE BIOPSY;  Surgeon: Alphonsa Overall, MD;  Location: [place];  Service: General;  Laterality: Right;  . CARDIAC CATHETERIZATION    . CARPAL TUNNEL RELEASE     Bilateral  . CHOLECYSTECTOMY    . CORONARY ANGIOPLASTY     2010 has stent in place  . CYSTOSCOPY W/ RETROGRADES Right 09/21/2013   Procedure: CYSTOSCOPY WITH RIGHT RETROGRADE PYELOGRAM RIGHT DOUBLE J STENT ;  Surgeon: Fredricka Bonine, MD;  Location: [facility];  Service: Urology;  Laterality: Right;  . CYSTOSCOPY WITH URETEROSCOPY AND STENT PLACEMENT Right 10/25/2013   Procedure: CYSTOSCOPY RIGHT URETEROSCOPY HOLMIUM LASER LITHO AND STENT PLACEMENT;  Surgeon: Fredricka Bonine, MD;  Location: [facility];  Service: Urology;  Laterality: Right;  . HERNIA REPAIR    . INTRAOPERATIVE TRANSESOPHAGEAL ECHOCARDIOGRAM N/A 12/12/2014   Procedure: INTRAOPERATIVE TRANSESOPHAGEAL ECHOCARDIOGRAM;  Surgeon: Burnell Blanks, MD;  Location[facility] OR;  Service: Cardiovascular;  Laterality: N/A;  . JOINT REPLACEMENT     bil total knees  . KNEE ARTHROPLASTY  02/03/2012   Procedure: COMPUTER ASSISTED TOTAL KNEE ARTHROPLASTY;  Surgeon: Mcarthur Rossetti, MD;  Location: [place];  Service: Orthopedics;  Laterality: Right;  Right total knee arthroplasty  . LEFT AND RIGHT HEART CATHETERIZATION WITH CORONARY ANGIOGRAM N/A 03/17/2013  Procedure: LEFT AND RIGHT HEART CATHETERIZATION WITH CORONARY ANGIOGRAM;  Surgeon: Burnell Blanks, MD;  Location: [facility] CATH LAB;  Service: Cardiovascular;  Laterality: N/A;  . LEFT AND RIGHT HEART CATHETERIZATION WITH CORONARY/GRAFT ANGIOGRAM N/A 09/14/2014   Procedure: LEFT AND RIGHT HEART CATHETERIZATION WITH Beatrix Fetters;  Surgeon: Burnell Blanks, MD;  Location: [facility] CATH LAB;  Service: Cardiovascular;  Laterality: N/A;  . TEE WITHOUT CARDIOVERSION N/A 03/14/2013   Procedure: TRANSESOPHAGEAL ECHOCARDIOGRAM (TEE);  Surgeon: Lelon Perla, MD;  Location: [facility] ENDOSCOPY;  Service: Cardiovascular;  Laterality: N/A;  . TEE WITHOUT  CARDIOVERSION N/A 11/14/2014   Procedure: TRANSESOPHAGEAL ECHOCARDIOGRAM (TEE);  Surgeon: Lelon Perla, MD;  Location: [facility] ENDOSCOPY;  Service: Cardiovascular;  Laterality: N/A;  . TOTAL KNEE ARTHROPLASTY  09/10/2012   Procedure: TOTAL KNEE ARTHROPLASTY;  Surgeon: Mcarthur Rossetti, MD;  Location: [facility];  Service: Orthopedics;  Laterality: Left;  . TOTAL KNEE REVISION Right 07/15/2013   Procedure: REVISION ARTHROPLASTY RIGHT KNEE;  Surgeon: Mcarthur Rossetti, MD;  Location: [facility];  Service: Orthopedics;  Laterality: Right;  . TRANSCATHETER AORTIC VALVE REPLACEMENT, TRANSFEMORAL N/A 12/12/2014   Procedure: TRANSCATHETER AORTIC VALVE REPLACEMENT, TRANSFEMORAL;  Surgeon: Burnell Blanks, MD;  Location: [place];  Service: Cardiovascular;  Laterality: N/A;  . TRIGGER FINGER RELEASE  09/10/2012   Procedure: RELEASE TRIGGER FINGER/A-1 PULLEY;  Surgeon: Mcarthur Rossetti, MD;  Location: [facility];  Service: Orthopedics;  Laterality: Right;  Right Ring Finger  . TUBAL LIGATION      Family History  Problem Relation Age of Onset  . Breast cancer Mother        stage IV at diagnosis  . Emphysema Mother        smoked  . Heart disease Mother   . COPD Father        smoked  . Asthma Father   . Heart disease Father   . Cancer Brother        Sinus    Social History   Socioeconomic History  . Marital status: Married    Spouse name: Not on file  . Number of children: 2  . Years of education: Not on file  . Highest education level: Not on file  Occupational History  . Occupation: Disabled  Tobacco Use  . Smoking status: Former Smoker    Packs/day: 1.50    Years: 30.00    Pack years: 45.00    Types: Cigarettes    Quit date: 12/29/2000    Years since quitting: 19.2  . Smokeless tobacco: Never Used  Substance and Sexual Activity  . Alcohol use: Yes    Comment: rarely  . Drug use: No  . Sexual activity: Not Currently    Birth control/protection: Surgical  Other Topics  Concern  . Not on file  Social History Narrative  . Not on file   Social Determinants of Health   Financial Resource Strain:   . Difficulty of Paying Living Expenses:   Food Insecurity:   . Worried About Charity fundraiser in the Last Year:   . Arboriculturist in the Last Year:   Transportation Needs:   . Film/video editor (Medical):   Marland Kitchen Lack of Transportation (Non-Medical):   Physical Activity:   . Days of Exercise per Week:   . Minutes of Exercise per Session:   Stress:   . Feeling of Stress :   Social Connections:   . Frequency of Communication with Friends and Family:   .  Frequency of Social Gatherings with Friends and Family:   . Attends Religious Services:   . Active Member of Clubs or Organizations:   . Attends Archivist Meetings:   Marland Kitchen Marital Status:   Intimate Partner Violence:   . Fear of Current or Ex-Partner:   . Emotionally Abused:   Marland Kitchen Physically Abused:   . Sexually Abused:     Outpatient Medications Prior to Visit  Medication Sig Dispense Refill  . Accu-Chek FastClix Lancets MISC Check blood sugar two times a day 102 each 9  . albuterol (PROVENTIL) (2.5 MG/3ML) 0.083% nebulizer solution Take 3 mLs (2.5 mg total) by nebulization every 4 (four) hours as needed for wheezing or shortness of breath. 180 mL 1  . albuterol (VENTOLIN HFA) 108 (90 Base) MCG/ACT inhaler INHALE 2 PUFFS BY MOUTH EVERY 4 HOURS AS NEEDED FOR COUGH, WHEEZING, OR SHORTNESS OF BREATH 18 g 3  . Alirocumab (PRALUENT) 150 MG/ML SOAJ Inject 1 pen into the skin every 14 (fourteen) days. 2 pen 11  . ASPIRIN ADULT LOW STRENGTH 81 MG EC tablet TAKE 1 Tablet BY MOUTH ONCE DAILY 90 tablet 3  . atorvastatin (LIPITOR) 80 MG tablet Take 1 tablet (80 mg total) by mouth daily. 90 tablet 1  . Blood Glucose Monitoring Suppl (ACCU-CHEK GUIDE) w/Device KIT 1 each by Does not apply route 2 (two) times daily. 1 kit 1  . clopidogrel (PLAVIX) 75 MG tablet Take 1 tablet (75 mg total) by mouth every  morning. 90 tablet 1  . DULoxetine (CYMBALTA) 30 MG capsule Take 1 capsule (30 mg total) by mouth 2 (two) times daily. 180 capsule 1  . exemestane (AROMASIN) 25 MG tablet Take 1 tablet (25 mg total) by mouth daily after breakfast. 90 tablet 12  . ezetimibe (ZETIA) 10 MG tablet Take 1 tablet (10 mg total) by mouth daily. 90 tablet 1  . Fluticasone-Salmeterol (ADVAIR DISKUS) 100-50 MCG/DOSE AEPB Inhale 1 puff into the lungs daily. (Patient not taking: Reported on 03/02/2020) 360 each 0  . furosemide (LASIX) 40 MG tablet Take 1 tablet (40 mg total) by mouth 2 (two) times daily. 180 tablet 1  . gabapentin (NEURONTIN) 300 MG capsule TAKE 3 CAPSULES BY MOUTH AT BEDTIME 90 capsule 5  . glucose blood (ACCU-CHEK GUIDE) test strip Check blood sugar 2 times per day 100 each 9  . hydrochlorothiazide (HYDRODIURIL) 25 MG tablet Take 1 tablet (25 mg total) by mouth daily. 90 tablet 1  . ibuprofen (ADVIL,MOTRIN) 200 MG tablet Take 400 mg by mouth every 6 (six) hours as needed for moderate pain.    . Insulin Pen Needle (PEN NEEDLES) 32G X 4 MM MISC 1.8 mg by Does not apply route daily. 300 each 2  . liraglutide (VICTOZA) 18 MG/3ML SOPN Inject 0.1 mLs (0.6 mg total) into the skin daily for 7 days, THEN 0.2 mLs (1.2 mg total) daily. (Patient not taking: Reported on 03/02/2020) 6.7 mL 5  . liraglutide (VICTOZA) 18 MG/3ML SOPN Inject 0.3 mLs (1.8 mg total) into the skin daily. 5 pen 5  . losartan (COZAAR) 100 MG tablet Take 1 tablet (100 mg total) by mouth daily. 90 tablet 1  . metoprolol tartrate (LOPRESSOR) 25 MG tablet Take 1 tablet (25 mg total) by mouth 2 (two) times daily. 180 tablet 1  . nitroGLYCERIN (NITROSTAT) 0.4 MG SL tablet Place 1 tablet (0.4 mg total) under the tongue every 5 (five) minutes as needed for chest pain. 100 tablet 0  . OXYGEN Inhale 3  L into the lungs as needed.    . potassium chloride SA (K-DUR,KLOR-CON) 20 MEQ tablet Take 2 tablets (40 mEq total) by mouth daily. 180 tablet 1  . TRELEGY ELLIPTA  100-62.5-25 MCG/INH AEPB INHALE 1 PUFF ONCE DAILY 180 each 1   No facility-administered medications prior to visit.    No Known Allergies  Review of Systems  Constitutional: negative for fevers, chills GI: negative for nausea, vomiting MSK: negative for joint pain Skin: no other rashes or bite marks      Objective:    Physical Exam Constitutional:      General: She is not in acute distress.    Appearance: Normal appearance.  Skin:      Neurological:     Mental Status: She is alert.  Psychiatric:        Mood and Affect: Mood normal.        Behavior: Behavior normal.     BP (!) 144/73 (BP Location: Left Wrist, Patient Position: Sitting, Cuff Size: Normal)   Pulse 79   Temp 98.2 F (36.8 C) (Oral)   Ht '5\' 1"'  (1.549 m)   Wt (!) 308 lb 4.8 oz (139.8 kg)   LMP 02/03/2012   SpO2 94%   BMI 58.25 kg/m  Wt Readings from Last 3 Encounters:  04/06/20 (!) 308 lb 4.8 oz (139.8 kg)  03/02/20 (!) 303 lb (137.4 kg)  01/05/20 (!) 303 lb 12.8 oz (137.8 kg)    Health Maintenance Due  Topic Date Due  . COLONOSCOPY  Never done  . FOOT EXAM  02/03/2019  . COLON CANCER SCREENING ANNUAL FOBT  03/16/2019  . PAP SMEAR-Modifier  10/21/2019    There are no preventive care reminders to display for this patient.   Lab Results  Component Value Date   TSH 2.918 09/21/2013   Lab Results  Component Value Date   WBC 9.4 07/14/2019   HGB 13.1 07/14/2019   HCT 40.5 07/14/2019   MCV 82.2 07/14/2019   PLT 260 07/14/2019   Lab Results  Component Value Date   NA 137 07/14/2019   K 4.1 07/14/2019   CO2 26 07/14/2019   GLUCOSE 137 (H) 07/14/2019   BUN 12 07/14/2019   CREATININE 0.84 07/14/2019   BILITOT 1.3 (H) 07/14/2019   ALKPHOS 78 07/14/2019   AST 14 (L) 07/14/2019   ALT 13 07/14/2019   PROT 7.4 07/14/2019   ALBUMIN 3.5 07/14/2019   CALCIUM 9.5 07/14/2019   ANIONGAP 11 07/14/2019   GFR 58.43 (L) 11/02/2014   Lab Results  Component Value Date   CHOL 97 (L) 01/31/2019     Lab Results  Component Value Date   HDL 50 01/31/2019   Lab Results  Component Value Date   LDLCALC 26 01/31/2019   Lab Results  Component Value Date   TRIG 104 01/31/2019   Lab Results  Component Value Date   CHOLHDL 1.9 01/31/2019   Lab Results  Component Value Date   HGBA1C 6.9 (A) 11/02/2019       Assessment & Plan:   Problem List Items Addressed This Visit      Musculoskeletal and Integument   Tick bite - Primary    Patient presents after sustaining tick bite to right upper extremity 4 days ago. She saw and removed the tick successfully. She and her husband thoroughly checked the rest of her body for ticks and did not see others. She noticed a round, erythematous rash shortly after sustaining the bite. The area itches,  but has not spread or become painful. No systemic signs of infection. Will treat with recommended 1x dose of doxycycline as prophylaxis. Instructed to treat area with topical therapies as needed. She will call if rash spreads or if she develops systemic symptoms.           Meds ordered this encounter  Medications  . DISCONTD: doxycycline (ADOXA) 100 MG tablet    Sig: Take 2 tablets (200 mg total) by mouth 2 (two) times daily for 1 dose.    Dispense:  2 tablet    Refill:  0  . doxycycline (ADOXA) 100 MG tablet    Sig: Take 2 tablets (200 mg total) by mouth once for 1 dose.    Dispense:  2 tablet    Refill:  0     Lucylle Foulkes D Orenthal Debski, DO

## 2020-04-06 NOTE — Patient Instructions (Signed)
[name], It was nice meeting you!  Today we discussed your tick bite. Guidelines say it is best to take a 1x dose of an antibiotic to prevent you from getting any systemic infection from this.   Otherwise you can apply over the counter anti-itch creams, apply heat/cool packs to the skin for itching.   Please call if you notice a worsening rash, develop fever or other concerning symptoms.   Take care, Dr. Koleen Distance

## 2020-04-06 NOTE — Assessment & Plan Note (Signed)
Patient presents after sustaining tick bite to right upper extremity 4 days ago. She saw and removed the tick successfully. She and her husband thoroughly checked the rest of her body for ticks and did not see others. She noticed a round, erythematous rash shortly after sustaining the bite. The area itches, but has not spread or become painful. No systemic signs of infection. Will treat with recommended 1x dose of doxycycline as prophylaxis. Instructed to treat area with topical therapies as needed. She will call if rash spreads or if she develops systemic symptoms.

## 2020-04-09 ENCOUNTER — Ambulatory Visit: Payer: Medicaid Other | Attending: Internal Medicine

## 2020-04-09 DIAGNOSIS — Z23 Encounter for immunization: Secondary | ICD-10-CM

## 2020-04-09 NOTE — Progress Notes (Signed)
   Covid-19 Vaccination Clinic  Name:  [name]    MRN: [id number] DOB: Nov 14, 1961  04/09/2020  [name] was observed post Covid-19 immunization for 15 minutes without incident. She was provided with Vaccine Information Sheet and instruction to access the V-Safe system.   [name] was instructed to call 911 with any severe reactions post vaccine: Marland Kitchen Difficulty breathing  . Swelling of face and throat  . A fast heartbeat  . A bad rash all over body  . Dizziness and weakness   Immunizations Administered    Name Date Dose VIS Date Route   Pfizer COVID-19 Vaccine 04/09/2020  1:00 PM 0.3 mL 12/09/2019 Intramuscular   Manufacturer: [place]   Lot: [id number]   [name]: [id number]

## 2020-04-11 ENCOUNTER — Ambulatory Visit: Payer: Medicaid Other | Attending: Physician Assistant

## 2020-04-11 ENCOUNTER — Encounter: Payer: Self-pay | Admitting: Physician Assistant

## 2020-04-11 ENCOUNTER — Ambulatory Visit (INDEPENDENT_AMBULATORY_CARE_PROVIDER_SITE_OTHER): Payer: Medicaid Other

## 2020-04-11 ENCOUNTER — Other Ambulatory Visit: Payer: Self-pay

## 2020-04-11 ENCOUNTER — Ambulatory Visit (INDEPENDENT_AMBULATORY_CARE_PROVIDER_SITE_OTHER): Payer: Medicaid Other | Admitting: Physician Assistant

## 2020-04-11 DIAGNOSIS — M7542 Impingement syndrome of left shoulder: Secondary | ICD-10-CM | POA: Diagnosis not present

## 2020-04-11 DIAGNOSIS — G8929 Other chronic pain: Secondary | ICD-10-CM

## 2020-04-11 DIAGNOSIS — M25512 Pain in left shoulder: Secondary | ICD-10-CM | POA: Diagnosis not present

## 2020-04-11 DIAGNOSIS — M25552 Pain in left hip: Secondary | ICD-10-CM | POA: Diagnosis not present

## 2020-04-11 DIAGNOSIS — R293 Abnormal posture: Secondary | ICD-10-CM

## 2020-04-11 DIAGNOSIS — M545 Low back pain, unspecified: Secondary | ICD-10-CM

## 2020-04-11 NOTE — Progress Notes (Signed)
Office Visit Note   Patient: [name]           Date of Birth: [birth year]           MRN: [id number] Visit Date: 04/11/2020              Requested by: Earlene Plater, MD [address] [place],  [place] [id number] PCP: Earlene Plater, MD   Assessment & Plan: Visit Diagnoses:  1. Pain in left hip   2. Chronic low back pain, unspecified back pain laterality, unspecified whether sciatica present     Plan: Due to patient's arthritis the left shoulder recommend she gain a home exercise program from physical therapy to work on range of motion and strengthening.  We'll also send her for an intra-articular injection of the left shoulder under ultrasound with Dr. Junius Roads.  In regards to her lumbar spine due to the fact that she has spondylolisthesis of radicular symptoms down the left leg despite medications and doing stretching and back exercises at home would recommend MRI of the lumbar spine rule out HNP.  Have him follow-up after the MRI to go over results and discuss further treatment.  Questions encouraged and answered at length. Patient is due for her second Covid vaccine injection on May 4 and therefore would recommend the shoulder injection intra-articular with Dr. Junius Roads to be 2 weeks after that.  Follow-Up Instructions: Return After MRI lumbar spine.   Orders:  Orders Placed This Encounter  Procedures  . XR HIP UNILAT W OR W/O PELVIS 2-3 VIEWS LEFT  . XR Lumbar Spine 2-3 Views   No orders of the defined types were placed in this encounter.     Procedures: No procedures performed   Clinical Data: No additional findings.   Subjective: Chief Complaint  Patient presents with  . Left Shoulder - Follow-up  . Left Hip - Pain    HPI [name] returns today follow-up of her left shoulder.  She states the subacromial injection gave her no relief.  She still having pain with range of motion.  She is in a therapy.  She denies any radicular symptoms down  the left arm.  She continues to have left hip pain.  Today she states she has some numbness tingling down her left leg from her buttocks region to her left calf.  States is also a pulling-like sensation in her buttocks region on the left.  She is tried some stretching ibuprofen for this.  She has had no saddle anesthesia like symptoms no change in bowel bladder symptoms.  No waking pain.  Review of Systems See HPI.  Objective: Vital Signs: LMP 02/03/2012   Physical Exam Constitutional:      Appearance: She is not ill-appearing or diaphoretic.  Pulmonary:     Effort: Pulmonary effort is normal.  Neurological:     Mental Status: She is alert and oriented to person, place, and time.  Psychiatric:        Mood and Affect: Mood normal.     Ortho Exam Left shoulder: She has pain with external rotation and limited external rotation.  Overhead activity she'll ask about 30 to 40 passively. Bilateral hips good range of motion without pain.  Negative straight leg raise bilaterally.  Lower extremities 5 out of 5 strength throughout against resistance.  Sensation grossly intact bilateral feet.  Deep tendon reflexes are equal and symmetric bilaterally at the knees and ankles. Specialty Comments:  No specialty comments available.  Imaging:  XR HIP UNILAT W OR W/O PELVIS 2-3 VIEWS LEFT  Result Date: 04/11/2020 AP pelvis lateral view of left hip: No acute fractures.  Slight narrowing of the hip joints bilaterally.  Films are slightly diminished in detail due to patient's size.  XR Lumbar Spine 2-3 Views  Result Date: 04/11/2020 Lumbar spine: No acute fractures.  Grade 1 spondylolisthesis L4 on 5.  Disc space well-maintained.  Degenerative changes of the lower lumbar facets.  Arthrosclerosis of the aorta.    PMFS History: Patient Active Problem List   Diagnosis Date Noted  . Tick bite 04/06/2020  . Stiffness of hand joint 06/09/2019  . Right arm numbness 06/09/2019  . Low back pain 05/02/2019   . Pain of left hip joint 08/09/2018  . Malignant neoplasm of upper-outer quadrant of right breast in female, estrogen receptor positive ([place]) 06/10/2018  . Screening for colon cancer 03/12/2018  . Screening for breast cancer 03/12/2018  . Restless leg syndrome 02/04/2018  . 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 Depression 08/06/2015  . 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 Hypertension associated with diabetes ([place])   . Aortic stenosis   . Morbid obesity with body mass index of 60.0-69.9 in adult [facility])   . Chronic diastolic congestive heart failure ([place])   . COPD (chronic obstructive pulmonary disease) ([place]) 09/21/2013  . Obstructive sleep apnea 09/21/2013  . Nocturnal hypoxemia 09/12/2013  . Abnormal CT scan, chest 05/12/2013  . Hyperlipidemia 11/30/2012  . History of pulmonary embolus (PE) 07/09/2012  . Long term (current) use of anticoagulants 02/20/2012  . Normocytic anemia 02/08/2012  . Type 2 diabetes mellitus without complication, without long-term current use of insulin ([place]) 02/08/2012  . Asthma 02/08/2012  . GERD (gastroesophageal reflux disease)   . CAD (coronary artery disease) 12/08/2011   Past Medical History:  Diagnosis Date  . Anemia   . Aortic valve stenosis, severe   . 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 Asthma   . Blood transfusion    [facility]  . Breast cancer in female [place])    Right  . CAD (coronary artery disease)    Cath 2010 with DES x 1 RCA-- PT'S CARDIOLOGIST IS DR. Humboldt  . Chronic diastolic congestive heart failure ([place])   . COPD (chronic obstructive pulmonary disease) ([place])   . Depression   . Diabetes mellitus DIAGNOSED IN2010   Controll s with diet  . Eczema    on back  . Hyperlipidemia   . Hypertension   . Kidney stones   . Morbid obesity with body mass index of  60.0-69.9 in adult [facility])   . Myocardial infarction ([place])    PT THINKS SHE WAS DX WITH MI AT THE TIME OF HEART STENTING  . Pulmonary embolism ([place]) 02/08/12   S/P RT TOTAL KNEE ON 02/03/12--ON 02/08/12--DEVELOPED ACUTE SOB AND CHEST PAIN--AND DIAGNOSED WITH  PULMONARY EMBOLUS AND PNEUMONIA  . Restless leg syndrome   . Sleep apnea    uses 3 liters O2 at night   . Uterine fibroid    NO PROBLEMS AT PRESENT FROM THE FIBROIDS-STATES SHE IS POST MENOPAUSAL-LAST MENSES 2010 EXCEPT FOR EPISODE THIS YR OF BLEEDING RELATED TO FIBROIDS.  Marland Kitchen Weakness    BOTH HANDS - S/P BILATERAL CARPAL TUNNEL RELEASE--BUT STILL HAS WEAKNESS--OFTEN DROPS THINGS    Family History  Problem Relation Age of Onset  . Breast cancer Mother        stage IV at diagnosis  . Emphysema Mother        smoked  . Heart disease Mother   . COPD Father        smoked  . Asthma Father   . Heart disease Father   . Cancer Brother        Sinus    Past Surgical History:  Procedure Laterality Date  . BREAST LUMPECTOMY WITH RADIOACTIVE SEED AND SENTINEL LYMPH NODE BIOPSY Right 07/19/2018   Procedure: BREAST LUMPECTOMY WITH RADIOACTIVE SEED AND SENTINEL LYMPH NODE BIOPSY;  Surgeon: Alphonsa Overall, MD;  Location: [place];  Service: General;  Laterality: Right;  . CARDIAC CATHETERIZATION    . CARPAL TUNNEL RELEASE     Bilateral  . CHOLECYSTECTOMY    . CORONARY ANGIOPLASTY     2010 has stent in place  . CYSTOSCOPY W/ RETROGRADES Right 09/21/2013   Procedure: CYSTOSCOPY WITH RIGHT RETROGRADE PYELOGRAM RIGHT DOUBLE J STENT ;  Surgeon: Fredricka Bonine, MD;  Location: [facility];  Service: Urology;  Laterality: Right;  . CYSTOSCOPY WITH URETEROSCOPY AND STENT PLACEMENT Right 10/25/2013   Procedure: CYSTOSCOPY RIGHT URETEROSCOPY HOLMIUM LASER LITHO AND STENT PLACEMENT;  Surgeon: Fredricka Bonine, MD;  Location: [facility];  Service: Urology;  Laterality: Right;  . HERNIA REPAIR    . INTRAOPERATIVE TRANSESOPHAGEAL ECHOCARDIOGRAM N/A 12/12/2014    Procedure: INTRAOPERATIVE TRANSESOPHAGEAL ECHOCARDIOGRAM;  Surgeon: Burnell Blanks, MD;  Location[facility] OR;  Service: Cardiovascular;  Laterality: N/A;  . JOINT REPLACEMENT     bil total knees  . KNEE ARTHROPLASTY  02/03/2012   Procedure: COMPUTER ASSISTED TOTAL KNEE ARTHROPLASTY;  Surgeon: Mcarthur Rossetti, MD;  Location: [place];  Service: Orthopedics;  Laterality: Right;  Right total knee arthroplasty  . LEFT AND RIGHT HEART CATHETERIZATION WITH CORONARY ANGIOGRAM N/A 03/17/2013   Procedure: LEFT AND RIGHT HEART CATHETERIZATION WITH CORONARY ANGIOGRAM;  Surgeon: Burnell Blanks, MD;  Location: [facility] CATH LAB;  Service: Cardiovascular;  Laterality: N/A;  . LEFT AND RIGHT HEART CATHETERIZATION WITH CORONARY/GRAFT ANGIOGRAM N/A 09/14/2014   Procedure: LEFT AND RIGHT HEART CATHETERIZATION WITH Beatrix Fetters;  Surgeon: Burnell Blanks, MD;  Location: [facility] CATH LAB;  Service: Cardiovascular;  Laterality: N/A;  . TEE WITHOUT CARDIOVERSION N/A 03/14/2013   Procedure: TRANSESOPHAGEAL ECHOCARDIOGRAM (TEE);  Surgeon: Lelon Perla, MD;  Location[facility] ENDOSCOPY;  Service: Cardiovascular;  Laterality: N/A;  . TEE WITHOUT CARDIOVERSION N/A 11/14/2014   Procedure: TRANSESOPHAGEAL ECHOCARDIOGRAM (TEE);  Surgeon: Lelon Perla, MD;  Location: [facility] ENDOSCOPY;  Service: Cardiovascular;  Laterality: N/A;  . TOTAL KNEE ARTHROPLASTY  09/10/2012   Procedure: TOTAL KNEE ARTHROPLASTY;  Surgeon: Mcarthur Rossetti, MD;  Location[facility];  Service: Orthopedics;  Laterality: Left;  . TOTAL KNEE REVISION Right 07/15/2013   Procedure: REVISION ARTHROPLASTY RIGHT KNEE;  Surgeon: Mcarthur Rossetti, MD;  Location: [facility];  Service: Orthopedics;  Laterality: Right;  . TRANSCATHETER AORTIC VALVE REPLACEMENT, TRANSFEMORAL N/A 12/12/2014   Procedure: TRANSCATHETER AORTIC VALVE REPLACEMENT, TRANSFEMORAL;  Surgeon: Burnell Blanks, MD;  Location: [place];  Service: Cardiovascular;   Laterality: N/A;  . TRIGGER FINGER RELEASE  09/10/2012   Procedure: RELEASE TRIGGER FINGER/A-1 PULLEY;  Surgeon: Mcarthur Rossetti, MD;  Location: [facility];  Service: Orthopedics;  Laterality: Right;  Right Ring Finger  . TUBAL LIGATION     Social History   Occupational History  . Occupation: Disabled  Tobacco Use  . Smoking status: Former Smoker    Packs/day: 1.50    Years: 30.00    Pack years: 45.00    Types: Cigarettes    Quit date: 12/29/2000    Years since quitting: 19.2  . Smokeless tobacco: Never Used  Substance and Sexual Activity  . Alcohol use: Yes    Comment: rarely  . Drug use: No  . Sexual activity: Not Currently    Birth control/protection: Surgical

## 2020-04-12 ENCOUNTER — Other Ambulatory Visit: Payer: Self-pay | Admitting: Radiology

## 2020-04-12 DIAGNOSIS — M545 Low back pain, unspecified: Secondary | ICD-10-CM

## 2020-04-12 DIAGNOSIS — G8929 Other chronic pain: Secondary | ICD-10-CM

## 2020-04-12 NOTE — Therapy (Signed)
[place], [place], [address] Phone: [phone]   Fax:  [phone]  Physical Therapy Evaluation  Patient Details  Name: [name] MRN: [id number] Date of Birth: 1961/03/14 Referring Provider (PT): Pete Pelt, [place]   Encounter Date: 04/11/2020  PT End of Session - 04/12/20 1105    Visit Number  1    Number of Visits  4    Authorization Type  MCD    Authorization - Visit Number  3    Authorization - Number of Visits  0    Progress Note Due on Visit  4    PT Start Time  1450    PT Stop Time  1535    PT Time Calculation (min)  45 min    Activity Tolerance  Patient tolerated treatment well    Behavior During Therapy  [facility] for tasks assessed/performed       Past Medical History:  Diagnosis Date  . Anemia   . Aortic valve stenosis, severe   . 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 Asthma   . Blood transfusion    2013[facility]  . Breast cancer in female [facility])    Right  . CAD (coronary artery disease)    Cath 2010 with DES x 1 RCA-- PT'S CARDIOLOGIST IS DR. [place]  . Chronic diastolic congestive heart failure ([place])   . COPD (chronic obstructive pulmonary disease) ([place])   . Depression   . Diabetes mellitus DIAGNOSED IN2010   Controll s with diet  . Eczema    on back  . Hyperlipidemia   . Hypertension   . Kidney stones   . Morbid obesity with body mass index of 60.0-69.9 in adult [facility])   . Myocardial infarction ([place])    PT THINKS SHE WAS DX WITH MI AT THE TIME OF HEART STENTING  . Pulmonary embolism ([place]) 02/08/12   S/P RT TOTAL KNEE ON 02/03/12--ON 02/08/12--DEVELOPED ACUTE SOB AND CHEST PAIN--AND DIAGNOSED WITH  PULMONARY EMBOLUS AND PNEUMONIA  . Restless leg syndrome   . Sleep apnea    uses 3 liters O2 at night   . Uterine fibroid    NO PROBLEMS AT PRESENT FROM THE  FIBROIDS-STATES SHE IS POST MENOPAUSAL-LAST MENSES 2010 EXCEPT FOR EPISODE THIS YR OF BLEEDING RELATED TO FIBROIDS.  Marland Kitchen Weakness    BOTH HANDS - S/P BILATERAL CARPAL TUNNEL RELEASE--BUT STILL HAS WEAKNESS--OFTEN DROPS THINGS    Past Surgical History:  Procedure Laterality Date  . BREAST LUMPECTOMY WITH RADIOACTIVE SEED AND SENTINEL LYMPH NODE BIOPSY Right 07/19/2018   Procedure: BREAST LUMPECTOMY WITH RADIOACTIVE SEED AND SENTINEL LYMPH NODE BIOPSY;  Surgeon: Alphonsa Overall, MD;  Location: [facility];  Service: General;  Laterality: Right;  . CARDIAC CATHETERIZATION    . CARPAL TUNNEL RELEASE     Bilateral  . CHOLECYSTECTOMY    . CORONARY ANGIOPLASTY     2010 has stent in place  . CYSTOSCOPY W/ RETROGRADES Right 09/21/2013   Procedure: CYSTOSCOPY WITH RIGHT RETROGRADE PYELOGRAM RIGHT DOUBLE J STENT ;  Surgeon: Fredricka Bonine, MD;  [facility];  Service: Urology;  Laterality: Right;  . CYSTOSCOPY WITH URETEROSCOPY AND STENT PLACEMENT Right 10/25/2013   Procedure: CYSTOSCOPY RIGHT URETEROSCOPY HOLMIUM LASER LITHO AND STENT PLACEMENT;  Surgeon: Fredricka Bonine, MD;  [facility];  Service: Urology;  Laterality: Right;  . HERNIA REPAIR    . INTRAOPERATIVE TRANSESOPHAGEAL ECHOCARDIOGRAM N/A 12/12/2014   Procedure: INTRAOPERATIVE TRANSESOPHAGEAL ECHOCARDIOGRAM;  Surgeon: Burnell Blanks, MD;  Location: [facility] OR;  Service: Cardiovascular;  Laterality: N/A;  . JOINT REPLACEMENT     bil total knees  . KNEE ARTHROPLASTY  02/03/2012   Procedure: COMPUTER ASSISTED TOTAL KNEE ARTHROPLASTY;  Surgeon: Mcarthur Rossetti, MD;  Location: [facility];  Service: Orthopedics;  Laterality: Right;  Right total knee arthroplasty  . LEFT AND RIGHT HEART CATHETERIZATION WITH CORONARY ANGIOGRAM N/A 03/17/2013   Procedure: LEFT AND RIGHT HEART CATHETERIZATION WITH CORONARY ANGIOGRAM;  Surgeon: Burnell Blanks, MD;  Location: [facility] CATH LAB;  Service: Cardiovascular;  Laterality: N/A;  . LEFT AND  RIGHT HEART CATHETERIZATION WITH CORONARY/GRAFT ANGIOGRAM N/A 09/14/2014   Procedure: LEFT AND RIGHT HEART CATHETERIZATION WITH Beatrix Fetters;  Surgeon: Burnell Blanks, MD;  Location: [facility] CATH LAB;  Service: Cardiovascular;  Laterality: N/A;  . TEE WITHOUT CARDIOVERSION N/A 03/14/2013   Procedure: TRANSESOPHAGEAL ECHOCARDIOGRAM (TEE);  Surgeon: Lelon Perla, MD;  Location: [facility] ENDOSCOPY;  Service: Cardiovascular;  Laterality: N/A;  . TEE WITHOUT CARDIOVERSION N/A 11/14/2014   Procedure: TRANSESOPHAGEAL ECHOCARDIOGRAM (TEE);  Surgeon: Lelon Perla, MD;  Location: [facility] ENDOSCOPY;  Service: Cardiovascular;  Laterality: N/A;  . TOTAL KNEE ARTHROPLASTY  09/10/2012   Procedure: TOTAL KNEE ARTHROPLASTY;  Surgeon: Mcarthur Rossetti, MD;  [facility];  Service: Orthopedics;  Laterality: Left;  . TOTAL KNEE REVISION Right 07/15/2013   Procedure: REVISION ARTHROPLASTY RIGHT KNEE;  Surgeon: Mcarthur Rossetti, MD;  [facility];  Service: Orthopedics;  Laterality: Right;  . TRANSCATHETER AORTIC VALVE REPLACEMENT, TRANSFEMORAL N/A 12/12/2014   Procedure: TRANSCATHETER AORTIC VALVE REPLACEMENT, TRANSFEMORAL;  Surgeon: Burnell Blanks, MD;  Location: [place];  Service: Cardiovascular;  Laterality: N/A;  . TRIGGER FINGER RELEASE  09/10/2012   Procedure: RELEASE TRIGGER FINGER/A-1 PULLEY;  Surgeon: Mcarthur Rossetti, MD;  [facility];  Service: Orthopedics;  Laterality: Right;  Right Ring Finger  . TUBAL LIGATION      There were no vitals filed for this visit.   Subjective Assessment - 04/11/20 1503    Subjective  Pt reports L shoulder pain, alteral GH area, affecting use of the L UE c reaching forward and overhead activities for 6-7 months. pt reports receiving 1 cortisone shot which did not reduce the pain. Currently, rates pain a 4/10 c a pain range of 4-7/10.Pt reports multiple areas of pain, back and LEs which were not addressed.    Limitations   Lifting;House hold activities;Other (comment)   dressing   How long can you sit comfortably?  10 mins    How long can you stand comfortably?  5 mins, related to back pain    How long can you walk comfortably?  A few mins. related to back pain    Diagnostic tests  Spurring was noted in the humeral head.  Acromioclavicular arthritis was noted.  Humerus appears to be inferiorly displaced.  It raises concern about rotator cuff tear. Impression: Osteoarthritic changes were noted.  Humerus appears to be inferiorly displaced.    Patient Stated Goals  To have less pain with my R shoulder    Currently in Pain?  Yes    Pain Score  4     Pain Location  Shoulder    Pain Orientation  Left;Lateral    Pain Descriptors / Indicators  Aching    Pain Type  Chronic pain  Pain Radiating Towards  NA    Pain Onset  More than a month ago    Aggravating Factors   Reaching forward and overhead    Pain Relieving Factors  meds: tylenol, ibuprofen    Effect of Pain on Daily Activities  moderate impact on ADLs         [facility] PT Assessment - 04/12/20 0001      Assessment   Medical Diagnosis  Impingement syndrome L shoulder    Referring Provider (PT)  Pete Pelt, PA-C    Onset Date/Surgical Date  --   6 to7 months ago   Hand Dominance  Left    Prior Therapy  None      Precautions   Precautions  None      Restrictions   Weight Bearing Restrictions  No      Home Environment   Living Environment  --      Prior Function   Level of Independence  Independent      Cognition   Overall Cognitive Status  Within Functional Limits for tasks assessed      Sensation   Light Touch  Appears Intact      Coordination   Gross Motor Movements are Fluid and Coordinated  Yes      Posture/Postural Control   Posture/Postural Control  Postural limitations    Postural Limitations  Increased thoracic kyphosis      ROM / Strength   AROM / PROM / Strength  AROM;PROM;Strength      AROM   Overall AROM Comments   Shoulder flexion L 80d, R 120   Limited by pain; additional PROM available; empty endfeel     Strength   Overall Strength Comments  L shoulder MMT = 4+,5/5 c min pain reproduction c ER, flex, abd      Special Tests    Special Tests  Rotator Cuff Impingement;Biceps/Labral Tests    Rotator Cuff Impingment tests  Michel Bickers test;Empty Can test;Full Can test    Biceps/Labral tests  Speeds Test      Hawkins-Kennedy test   Findings  Positive    Side  Left      Empty Can test   Findings  Negative    Side  Left    Comment  min pain reproduction      Full Can test   Findings  Negative    Side  Left    Comment  min pain reproduction      Speeds test   findings  Negative    Side  Left      Pronated load test (SLAP)   Findings  Negative    Side  Left                Objective measurements completed on examination: See above findings.              PT Education - 04/12/20 0644    Education Details  Finding of eval, POC, the importance of proper shoulder girdle posture for decreasing [place] joint strain, sleeping and sitting positions and support to help reduce L shoulder strain.    Person(s) Educated  Patient    Methods  Explanation;Demonstration;Tactile cues;Verbal cues;Handout    Comprehension  Verbalized understanding;Returned demonstration;Verbal cues required;Tactile cues required;Need further instruction       PT Short Term Goals - 04/12/20 1120      PT SHORT TERM GOAL #1   Title  Pt will be Ind in an intial HEP.  Baseline  No program    Time  3    Period  Weeks    Status  New    Target Date  05/03/20      PT SHORT TERM GOAL #2   Title  Pt will report understanding of proper posture and it's affect on shoulder health and reduction of pain.    Baseline  Limited understanding    Time  3    Period  Weeks    Status  New    Target Date  05/03/20        PT Long Term Goals - 04/12/20 1123      PT LONG TERM GOAL #1   Title  Pt's L shoulder  AROm will improve to 100d to complete dressing and in home overhead activities    Baseline  80d    Time  8    Period  Weeks    Status  New    Target Date  06/08/20      PT LONG TERM GOAL #2   Title  Pt will report an improved pain range of 0-4 for the L shoulder c ADLs for improve qaulity of life    Baseline  4-7/10    Time  8    Period  Weeks    Status  New    Target Date  06/08/20      PT LONG TERM GOAL #3   Title  Pt will report/demonstrate ways/techniques to help manage her L shoulder pain    Baseline  Limited understanding    Time  8    Period  Weeks    Status  New    Target Date  06/08/20      PT LONG TERM GOAL #4   Title  Pt will be Ind in a final HEP for her L shoulderNo program    Baseline  no program    Time  8    Period  Weeks    Status  New    Target Date  06/08/20             Plan - 04/12/20 1106    Clinical Impression Statement  Pt presents with sign and symptoms consistent with impingement syndrome. Strength of the L rotator cuff muscles was good c min. reproduction of pain c resisted ER, flex, and abd. Pt will benefit from continuation of PT to address L scapular posture and to decrease L pain.    Personal Factors and Comorbidities  Comorbidity 3+    Comorbidities  COPD, DM, CAD, obesity, asthma    Examination-Activity Limitations  Caring for [employer] Overhead    Examination-Participation Restrictions  Driving;Laundry;Shop;Meal Prep;Yard Work;Cleaning    Stability/Clinical Decision Making  Evolving/Moderate complexity    Clinical Decision Making  Moderate    Rehab Potential  Fair    PT Frequency  2x / week    PT Duration  6 weeks    PT Treatment/Interventions  ADLs/Self Care Home Management;Electrical Stimulation;Moist Heat;Iontophoresis 4mg /ml Dexamethasone;Therapeutic activities;Therapeutic exercise;Patient/family education;Manual techniques;Passive range of motion;Dry needling;Taping    PT Next Visit Plan  Review response  to education and HEP.    PT Home Exercise Plan  Scapular retractions, shoulder ext c cane, At waist level, downward hand pressure, walk backward as tolerated as you maintain pressure, when walk forward. 10 reps, 3x daily. [id number]    Consulted and Agree with Plan of Care  Patient       Patient will benefit from skilled therapeutic intervention in order to  improve the following deficits and impairments:  Decreased strength, Postural dysfunction, Improper body mechanics, Impaired flexibility, Pain, Decreased knowledge of precautions, Impaired UE functional use, Decreased range of motion  Visit Diagnosis: Impingement syndrome of left shoulder - Plan: PT plan of care cert/re-cert  Chronic left shoulder pain - Plan: PT plan of care cert/re-cert  Abnormal posture - Plan: PT plan of care cert/re-cert     Problem List Patient Active Problem List   Diagnosis Date Noted  . Tick bite 04/06/2020  . Stiffness of hand joint 06/09/2019  . Right arm numbness 06/09/2019  . Low back pain 05/02/2019  . Pain of left hip joint 08/09/2018  . Malignant neoplasm of upper-outer quadrant of right breast in female, estrogen receptor positive ([place]) 06/10/2018  . Screening for colon cancer 03/12/2018  . Screening for breast cancer 03/12/2018  . Restless leg syndrome 02/04/2018  . 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 Depression 08/06/2015  . 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 Hypertension associated with diabetes ([place])   . Aortic stenosis   . Morbid obesity with body mass index of 60.0-69.9 in adult [facility])   . Chronic diastolic congestive heart failure ([place])   . COPD (chronic obstructive pulmonary disease) ([place]) 09/21/2013  . Obstructive sleep apnea 09/21/2013  . Nocturnal hypoxemia 09/12/2013  . Abnormal CT scan, chest 05/12/2013  . Hyperlipidemia 11/30/2012  . History of pulmonary embolus (PE) 07/09/2012  . Long term (current) use of  anticoagulants 02/20/2012  . Normocytic anemia 02/08/2012  . Type 2 diabetes mellitus without complication, without long-term current use of insulin ([place]) 02/08/2012  . Asthma 02/08/2012  . GERD (gastroesophageal reflux disease)   . CAD (coronary artery disease) 12/08/2011    Gar Ponto MS, PT 04/12/20 11:44 AM  [facility] [address] [place], [place], [address] Phone: [phone]   Fax:  [phone]  Name: [name] MRN: [id number] Date of Birth: March 20, 1961

## 2020-04-12 NOTE — Patient Instructions (Signed)
   At waist level, downward hand pressure, walk backward as tolerated as you maintain pressure, when walk forward. 10 reps, 3x daily

## 2020-04-13 ENCOUNTER — Other Ambulatory Visit: Payer: Self-pay | Admitting: Internal Medicine

## 2020-04-13 DIAGNOSIS — J441 Chronic obstructive pulmonary disease with (acute) exacerbation: Secondary | ICD-10-CM

## 2020-04-16 ENCOUNTER — Other Ambulatory Visit: Payer: Self-pay | Admitting: Internal Medicine

## 2020-04-16 DIAGNOSIS — J441 Chronic obstructive pulmonary disease with (acute) exacerbation: Secondary | ICD-10-CM

## 2020-04-16 MED ORDER — TRELEGY ELLIPTA 100-62.5-25 MCG/INH IN AEPB: 1.0000 | INHALATION_SPRAY | Freq: Every day | RESPIRATORY_TRACT | 0 refills | Status: DC

## 2020-04-16 NOTE — Telephone Encounter (Signed)
Need refills on TRELEGY ELLIPTA 100-62.5-25 MCG/INH AEPB  albuterol (VENTOLIN HFA) 108 (90 Base) MCG/ACT inhaler  ;pt contact [place], [place]

## 2020-04-18 ENCOUNTER — Encounter: Payer: Self-pay | Admitting: Family Medicine

## 2020-04-18 ENCOUNTER — Ambulatory Visit: Payer: Self-pay

## 2020-04-18 ENCOUNTER — Ambulatory Visit (INDEPENDENT_AMBULATORY_CARE_PROVIDER_SITE_OTHER): Payer: Medicaid Other | Admitting: Family Medicine

## 2020-04-18 ENCOUNTER — Other Ambulatory Visit: Payer: Self-pay

## 2020-04-18 DIAGNOSIS — M7542 Impingement syndrome of left shoulder: Secondary | ICD-10-CM

## 2020-04-18 MED ORDER — HYDROCODONE-ACETAMINOPHEN 5-325 MG PO TABS: 1.0000 | ORAL_TABLET | Freq: Four times a day (QID) | ORAL | 0 refills | Status: DC | PRN

## 2020-04-18 NOTE — Progress Notes (Signed)
Office Visit Note   Patient: [name]           Date of Birth: 1961/03/04           MRN: [id number] Visit Date: 04/18/2020 Requested by: Earlene Plater, MD [address] [place],  [place] [address] PCP: Earlene Plater, MD  Subjective: Chief Complaint  Patient presents with  . Left Shoulder - Pain    HPI: She is here for a planned left shoulder glenohumeral injection.              ROS:   All other systems were reviewed and are negative.  Objective: Vital Signs: LMP 02/03/2012   Physical Exam:  General:  Alert and oriented, in no acute distress. Pulm:  Breathing unlabored. Psy:  Normal mood, congruent affect.  Pain and limited range of motion with overhead reach.  Imaging: US Guided Needle Placement - No Linked Charges  Result Date: 04/18/2020 Ultrasound-guided left glenohumeral injection: After sterile prep with Betadine, injected 8 cc 1% lidocaine without epinephrine and 40 mg methylprednisolone using a 22-gauge spinal needle, passing the needle through approach into the glenohumeral joint.  Injectate seen filling joint capsule.     Assessment & Plan: 1.  Left shoulder impingement syndrome -Injection given as above.  She will follow-up as directed.     Procedures: No procedures performed  No notes on file     PMFS History: Patient Active Problem List   Diagnosis Date Noted  . Tick bite 04/06/2020  . Stiffness of hand joint 06/09/2019  . Right arm numbness 06/09/2019  . Low back pain 05/02/2019  . Pain of left hip joint 08/09/2018  . Malignant neoplasm of upper-outer quadrant of right breast in female, estrogen receptor positive ([place]) 06/10/2018  . Screening for colon cancer 03/12/2018  . Screening for breast cancer 03/12/2018  . Restless leg syndrome 02/04/2018  . 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 Depression 08/06/2015  . 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  Hypertension associated with diabetes ([place])   . Aortic stenosis   . Morbid obesity with body mass index of 60.0-69.9 in adult [facility])   . Chronic diastolic congestive heart failure ([place])   . COPD (chronic obstructive pulmonary disease) ([place]) 09/21/2013  . Obstructive sleep apnea 09/21/2013  . Nocturnal hypoxemia 09/12/2013  . Abnormal CT scan, chest 05/12/2013  . Hyperlipidemia 11/30/2012  . History of pulmonary embolus (PE) 07/09/2012  . Long term (current) use of anticoagulants 02/20/2012  . Normocytic anemia 02/08/2012  . Type 2 diabetes mellitus without complication, without long-term current use of insulin ([facility]) 02/08/2012  . Asthma 02/08/2012  . GERD (gastroesophageal reflux disease)   . CAD (coronary artery disease) 12/08/2011   Past Medical History:  Diagnosis Date  . Anemia   . Aortic valve stenosis, severe   . 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 Asthma   . Blood transfusion    2013[facility]  . Breast cancer in female [facility])    Right  . CAD (coronary artery disease)    Cath 2010 with DES x 1 RCA-- PT'S CARDIOLOGIST IS DR. [place]  . Chronic diastolic congestive heart failure ([place])   . COPD (chronic obstructive pulmonary disease) ([place])   . Depression   . Diabetes mellitus DIAGNOSED IN2010   Controll s with diet  .  Eczema    on back  . Hyperlipidemia   . Hypertension   . Kidney stones   . Morbid obesity with body mass index of 60.0-69.9 in adult [facility])   . Myocardial infarction ([place])    PT THINKS SHE WAS DX WITH MI AT THE TIME OF HEART STENTING  . Pulmonary embolism ([place]) 02/08/12   S/P RT TOTAL KNEE ON 02/03/12--ON 02/08/12--DEVELOPED ACUTE SOB AND CHEST PAIN--AND DIAGNOSED WITH  PULMONARY EMBOLUS AND PNEUMONIA  . Restless leg syndrome   . Sleep apnea    uses 3 liters O2 at night   . Uterine fibroid    NO PROBLEMS AT PRESENT FROM THE FIBROIDS-STATES SHE IS POST  MENOPAUSAL-LAST MENSES 2010 EXCEPT FOR EPISODE THIS YR OF BLEEDING RELATED TO FIBROIDS.  Marland Kitchen Weakness    BOTH HANDS - S/P BILATERAL CARPAL TUNNEL RELEASE--BUT STILL HAS WEAKNESS--OFTEN DROPS THINGS    Family History  Problem Relation Age of Onset  . Breast cancer Mother        stage IV at diagnosis  . Emphysema Mother        smoked  . Heart disease Mother   . COPD Father        smoked  . Asthma Father   . Heart disease Father   . Cancer Brother        Sinus    Past Surgical History:  Procedure Laterality Date  . BREAST LUMPECTOMY WITH RADIOACTIVE SEED AND SENTINEL LYMPH NODE BIOPSY Right 07/19/2018   Procedure: BREAST LUMPECTOMY WITH RADIOACTIVE SEED AND SENTINEL LYMPH NODE BIOPSY;  Surgeon: Alphonsa Overall, MD;  Location: [facility];  Service: General;  Laterality: Right;  . CARDIAC CATHETERIZATION    . CARPAL TUNNEL RELEASE     Bilateral  . CHOLECYSTECTOMY    . CORONARY ANGIOPLASTY     2010 has stent in place  . CYSTOSCOPY W/ RETROGRADES Right 09/21/2013   Procedure: CYSTOSCOPY WITH RIGHT RETROGRADE PYELOGRAM RIGHT DOUBLE J STENT ;  Surgeon: Fredricka Bonine, MD;  [facility];  Service: Urology;  Laterality: Right;  . CYSTOSCOPY WITH URETEROSCOPY AND STENT PLACEMENT Right 10/25/2013   Procedure: CYSTOSCOPY RIGHT URETEROSCOPY HOLMIUM LASER LITHO AND STENT PLACEMENT;  Surgeon: Fredricka Bonine, MD;  [facility];  Service: Urology;  Laterality: Right;  . HERNIA REPAIR    . INTRAOPERATIVE TRANSESOPHAGEAL ECHOCARDIOGRAM N/A 12/12/2014   Procedure: INTRAOPERATIVE TRANSESOPHAGEAL ECHOCARDIOGRAM;  Surgeon: Burnell Blanks, MD;  [facility] OR;  Service: Cardiovascular;  Laterality: N/A;  . JOINT REPLACEMENT     bil total knees  . KNEE ARTHROPLASTY  02/03/2012   Procedure: COMPUTER ASSISTED TOTAL KNEE ARTHROPLASTY;  Surgeon: Mcarthur Rossetti, MD;  Location: [place];  Service: Orthopedics;  Laterality: Right;  Right total knee arthroplasty  . LEFT AND RIGHT HEART  CATHETERIZATION WITH CORONARY ANGIOGRAM N/A 03/17/2013   Procedure: LEFT AND RIGHT HEART CATHETERIZATION WITH CORONARY ANGIOGRAM;  Surgeon: Burnell Blanks, MD;  Location: [facility] CATH LAB;  Service: Cardiovascular;  Laterality: N/A;  . LEFT AND RIGHT HEART CATHETERIZATION WITH CORONARY/GRAFT ANGIOGRAM N/A 09/14/2014   Procedure: LEFT AND RIGHT HEART CATHETERIZATION WITH Beatrix Fetters;  Surgeon: Burnell Blanks, MD;  Location: [facility] CATH LAB;  Service: Cardiovascular;  Laterality: N/A;  . TEE WITHOUT CARDIOVERSION N/A 03/14/2013   Procedure: TRANSESOPHAGEAL ECHOCARDIOGRAM (TEE);  Surgeon: Lelon Perla, MD;  Location: [facility] ENDOSCOPY;  Service: Cardiovascular;  Laterality: N/A;  . TEE WITHOUT CARDIOVERSION N/A 11/14/2014   Procedure: TRANSESOPHAGEAL ECHOCARDIOGRAM (TEE);  Surgeon: Lelon Perla,  MD;  Location: [place];  Service: Cardiovascular;  Laterality: N/A;  . TOTAL KNEE ARTHROPLASTY  09/10/2012   Procedure: TOTAL KNEE ARTHROPLASTY;  Surgeon: Mcarthur Rossetti, MD;  [facility];  Service: Orthopedics;  Laterality: Left;  . TOTAL KNEE REVISION Right 07/15/2013   Procedure: REVISION ARTHROPLASTY RIGHT KNEE;  Surgeon: Mcarthur Rossetti, MD;  [facility];  Service: Orthopedics;  Laterality: Right;  . TRANSCATHETER AORTIC VALVE REPLACEMENT, TRANSFEMORAL N/A 12/12/2014   Procedure: TRANSCATHETER AORTIC VALVE REPLACEMENT, TRANSFEMORAL;  Surgeon: Burnell Blanks, MD;  Location: [place];  Service: Cardiovascular;  Laterality: N/A;  . TRIGGER FINGER RELEASE  09/10/2012   Procedure: RELEASE TRIGGER FINGER/A-1 PULLEY;  Surgeon: Mcarthur Rossetti, MD;  [facility];  Service: Orthopedics;  Laterality: Right;  Right Ring Finger  . TUBAL LIGATION     Social History   Occupational History  . Occupation: Disabled  Tobacco Use  . Smoking status: Former Smoker    Packs/day: 1.50    Years: 30.00    Pack years: 45.00    Types: Cigarettes    Quit date:  12/29/2000    Years since quitting: 19.3  . Smokeless tobacco: Never Used  Substance and Sexual Activity  . Alcohol use: Yes    Comment: rarely  . Drug use: No  . Sexual activity: Not Currently    Birth control/protection: Surgical

## 2020-04-19 ENCOUNTER — Ambulatory Visit: Payer: Medicaid Other | Admitting: Physical Therapy

## 2020-04-19 ENCOUNTER — Encounter: Payer: Self-pay | Admitting: Physical Therapy

## 2020-04-19 DIAGNOSIS — G8929 Other chronic pain: Secondary | ICD-10-CM | POA: Diagnosis not present

## 2020-04-19 DIAGNOSIS — M7542 Impingement syndrome of left shoulder: Secondary | ICD-10-CM | POA: Diagnosis not present

## 2020-04-19 DIAGNOSIS — R293 Abnormal posture: Secondary | ICD-10-CM

## 2020-04-19 DIAGNOSIS — M25512 Pain in left shoulder: Secondary | ICD-10-CM

## 2020-04-19 NOTE — Therapy (Signed)
[place], [place], [address] Phone: [phone]   Fax:  [phone]  Physical Therapy Treatment  Patient Details  Name: [name] MRN: [id number] Date of Birth: [birth year] Referring Provider (PT): Pete Pelt, [place]   Encounter Date: 04/19/2020  PT End of Session - 04/19/20 1441    Visit Number  2    Number of Visits  4    Authorization Type  MCD    PT Start Time  1106   Patient 6 minutes late therpaist not notified   PT Stop Time  1145    PT Time Calculation (min)  39 min    Activity Tolerance  Patient tolerated treatment well    Behavior During Therapy  [facility] for tasks assessed/performed       Past Medical History:  Diagnosis Date  . Anemia   . Aortic valve stenosis, severe   . 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 Asthma   . Blood transfusion    [facility]  . Breast cancer in female [facility])    Right  . CAD (coronary artery disease)    Cath 2010 with DES x 1 RCA-- PT'S CARDIOLOGIST IS DR. [place]ak  . Chronic diastolic congestive heart failure ([place])   . COPD (chronic obstructive pulmonary disease) ([place])   . Depression   . Diabetes mellitus DIAGNOSED IN2010   Controll s with diet  . Eczema    on back  . Hyperlipidemia   . Hypertension   . Kidney stones   . Morbid obesity with body mass index of 60.0-69.9 in adult [facility])   . Myocardial infarction ([place])    PT THINKS SHE WAS DX WITH MI AT THE TIME OF HEART STENTING  . Pulmonary embolism ([place]) 02/08/12   S/P RT TOTAL KNEE ON 02/03/12--ON 02/08/12--DEVELOPED ACUTE SOB AND CHEST PAIN--AND DIAGNOSED WITH  PULMONARY EMBOLUS AND PNEUMONIA  . Restless leg syndrome   . Sleep apnea    uses 3 liters O2 at night   . Uterine fibroid    NO PROBLEMS AT PRESENT FROM THE FIBROIDS-STATES SHE IS POST MENOPAUSAL-LAST MENSES 2010 EXCEPT FOR  EPISODE THIS YR OF BLEEDING RELATED TO FIBROIDS.  Marland Kitchen Weakness    BOTH HANDS - S/P BILATERAL CARPAL TUNNEL RELEASE--BUT STILL HAS WEAKNESS--OFTEN DROPS THINGS    Past Surgical History:  Procedure Laterality Date  . BREAST LUMPECTOMY WITH RADIOACTIVE SEED AND SENTINEL LYMPH NODE BIOPSY Right 07/19/2018   Procedure: BREAST LUMPECTOMY WITH RADIOACTIVE SEED AND SENTINEL LYMPH NODE BIOPSY;  Surgeon: Alphonsa Overall, MD;  Location: [place];  Service: General;  Laterality: Right;  . CARDIAC CATHETERIZATION    . CARPAL TUNNEL RELEASE     Bilateral  . CHOLECYSTECTOMY    . CORONARY ANGIOPLASTY     2010 has stent in place  . CYSTOSCOPY W/ RETROGRADES Right 09/21/2013   Procedure: CYSTOSCOPY WITH RIGHT RETROGRADE PYELOGRAM RIGHT DOUBLE J STENT ;  Surgeon: Fredricka Bonine, MD;  [facility];  Service: Urology;  Laterality: Right;  . CYSTOSCOPY WITH URETEROSCOPY AND STENT PLACEMENT Right 10/25/2013   Procedure: CYSTOSCOPY RIGHT URETEROSCOPY HOLMIUM LASER LITHO AND STENT PLACEMENT;  Surgeon: Fredricka Bonine, MD;  [facility];  Service: Urology;  Laterality: Right;  . HERNIA REPAIR    . INTRAOPERATIVE TRANSESOPHAGEAL ECHOCARDIOGRAM N/A 12/12/2014   Procedure: INTRAOPERATIVE TRANSESOPHAGEAL ECHOCARDIOGRAM;  Surgeon: Burnell Blanks, MD;  Location: [place];  Service: Cardiovascular;  Laterality: N/A;  . JOINT REPLACEMENT     bil total knees  . KNEE ARTHROPLASTY  02/03/2012   Procedure: COMPUTER ASSISTED TOTAL KNEE ARTHROPLASTY;  Surgeon: Mcarthur Rossetti, MD;  Location: [place];  Service: Orthopedics;  Laterality: Right;  Right total knee arthroplasty  . LEFT AND RIGHT HEART CATHETERIZATION WITH CORONARY ANGIOGRAM N/A 03/17/2013   Procedure: LEFT AND RIGHT HEART CATHETERIZATION WITH CORONARY ANGIOGRAM;  Surgeon: Burnell Blanks, MD;  Location: [facility] CATH LAB;  Service: Cardiovascular;  Laterality: N/A;  . LEFT AND RIGHT HEART CATHETERIZATION WITH CORONARY/GRAFT ANGIOGRAM N/A  09/14/2014   Procedure: LEFT AND RIGHT HEART CATHETERIZATION WITH Beatrix Fetters;  Surgeon: Burnell Blanks, MD;  Location: [facility] CATH LAB;  Service: Cardiovascular;  Laterality: N/A;  . TEE WITHOUT CARDIOVERSION N/A 03/14/2013   Procedure: TRANSESOPHAGEAL ECHOCARDIOGRAM (TEE);  Surgeon: Lelon Perla, MD;  Location: [facility] ENDOSCOPY;  Service: Cardiovascular;  Laterality: N/A;  . TEE WITHOUT CARDIOVERSION N/A 11/14/2014   Procedure: TRANSESOPHAGEAL ECHOCARDIOGRAM (TEE);  Surgeon: Lelon Perla, MD;  Location: [facility] ENDOSCOPY;  Service: Cardiovascular;  Laterality: N/A;  . TOTAL KNEE ARTHROPLASTY  09/10/2012   Procedure: TOTAL KNEE ARTHROPLASTY;  Surgeon: Mcarthur Rossetti, MD;  [facility];  Service: Orthopedics;  Laterality: Left;  . TOTAL KNEE REVISION Right 07/15/2013   Procedure: REVISION ARTHROPLASTY RIGHT KNEE;  Surgeon: Mcarthur Rossetti, MD;  [facility];  Service: Orthopedics;  Laterality: Right;  . TRANSCATHETER AORTIC VALVE REPLACEMENT, TRANSFEMORAL N/A 12/12/2014   Procedure: TRANSCATHETER AORTIC VALVE REPLACEMENT, TRANSFEMORAL;  Surgeon: Burnell Blanks, MD;  Location: [place];  Service: Cardiovascular;  Laterality: N/A;  . TRIGGER FINGER RELEASE  09/10/2012   Procedure: RELEASE TRIGGER FINGER/A-1 PULLEY;  Surgeon: Mcarthur Rossetti, MD;  [facility];  Service: Orthopedics;  Laterality: Right;  Right Ring Finger  . TUBAL LIGATION      There were no vitals filed for this visit.  Subjective Assessment - 04/19/20 1115    Subjective  Patient had an injection yesterday. It is    Limitations  Lifting;House hold activities;Other (comment)                       [facility] Adult PT Treatment/Exercise - 04/19/20 0001      Shoulder Exercises: Supine   Other Supine Exercises  wand flexion and ER 2x10 each with education how to do er sitting. Advised not to do flexion seated       Shoulder Exercises: Seated   Extension Limitations   2x10 yellow     Row Limitations  x10 yellow       Manual Therapy   Manual Therapy  Soft tissue mobilization;Joint mobilization;Passive ROM    Joint Mobilization  Grade II and II PA and inferior glides     Soft tissue mobilization  to posterior shoulder and upper trap     Passive ROM  into ER and flexion              PT Education - 04/19/20 1437    Education Details  reviewed technique with ther-ex    Person(s) Educated  Patient    Methods  Explanation;Demonstration;Tactile cues;Verbal cues    Comprehension  Verbalized understanding;Returned demonstration;Tactile cues required;Verbal cues required;Need further instruction       PT Short Term Goals - 04/19/20 1513      PT SHORT TERM GOAL #1   Title  Pt  will be Ind in an intial HEP.    Time  3    Period  Weeks    Status  New    Target Date  05/03/20      PT SHORT TERM GOAL #2   Title  Pt will report understanding of proper posture and it's affect on shoulder health and reduction of pain.    Baseline  Limited understanding    Time  3    Period  Weeks    Status  On-going    Target Date  05/03/20        PT Long Term Goals - 04/12/20 1123      PT LONG TERM GOAL #1   Title  Pt's L shoulder AROm will improve to 100d to complete dressing and in home overhead activities    Baseline  80d    Time  8    Period  Weeks    Status  New    Target Date  06/08/20      PT LONG TERM GOAL #2   Title  Pt will report an improved pain range of 0-4 for the L shoulder c ADLs for improve qaulity of life    Baseline  4-7/10    Time  8    Period  Weeks    Status  New    Target Date  06/08/20      PT LONG TERM GOAL #3   Title  Pt will report/demonstrate ways/techniques to help manage her L shoulder pain    Baseline  Limited understanding    Time  8    Period  Weeks    Status  New    Target Date  06/08/20      PT LONG TERM GOAL #4   Title  Pt will be Ind in a final HEP for her L shoulderNo program    Baseline  no program     Time  8    Period  Weeks    Status  New    Target Date  06/08/20            Plan - 04/19/20 1444    Clinical Impression Statement  Patient had limited session today 2nd to recieving cortisone injection yesterday. Therapy focused on light mobilizations and light ROM to improve pain free range. Therapy reviewed exercises for posterior chain strengthening and to maintain ranges gained with PROm. Sdhe was advised to start these exercises on Saturday to give the shot time to sit.    Examination-Activity Limitations  Caring for [employer]    Examination-Participation Restrictions  Driving;Laundry;Shop;Meal Prep;Yard Work;Cleaning    Stability/Clinical Decision Making  Evolving/Moderate complexity    Clinical Decision Making  Moderate    Rehab Potential  Fair    PT Frequency  2x / week    PT Treatment/Interventions  ADLs/Self Care Home Management;Electrical Stimulation;Moist Heat;Iontophoresis 4mg /ml Dexamethasone;Therapeutic activities;Therapeutic exercise;Patient/family education;Manual techniques;Passive range of motion;Dry needling;Taping    PT Next Visit Plan  Review response to education and HEP. progress ther-ex. patient given seated scap retraction and extension. If her balance is good enough without the cane she may be able to do in the door but that is questionable.    PT Home Exercise Plan  Scapular retractions, shoulder ext c cane, At waist level, downward hand pressure, walk backward as tolerated as you maintain pressure, when walk forward. 10 reps, 3x daily. [id number]    Consulted and Agree with Plan of Care  Patient  Patient will benefit from skilled therapeutic intervention in order to improve the following deficits and impairments:  Decreased strength, Postural dysfunction, Improper body mechanics, Impaired flexibility, Pain, Decreased knowledge of precautions, Impaired UE functional use, Decreased range of motion  Visit  Diagnosis: Impingement syndrome of left shoulder  Chronic left shoulder pain  Abnormal posture     Problem List Patient Active Problem List   Diagnosis Date Noted  . Tick bite 04/06/2020  . Stiffness of hand joint 06/09/2019  . Right arm numbness 06/09/2019  . Low back pain 05/02/2019  . Pain of left hip joint 08/09/2018  . Malignant neoplasm of upper-outer quadrant of right breast in female, estrogen receptor positive ([place]) 06/10/2018  . Screening for colon cancer 03/12/2018  . Screening for breast cancer 03/12/2018  . Restless leg syndrome 02/04/2018  . 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 Depression 08/06/2015  . 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 Hypertension associated with diabetes ([place])   . Aortic stenosis   . Morbid obesity with body mass index of 60.0-69.9 in adult [facility])   . Chronic diastolic congestive heart failure ([place])   . COPD (chronic obstructive pulmonary disease) ([place]) 09/21/2013  . Obstructive sleep apnea 09/21/2013  . Nocturnal hypoxemia 09/12/2013  . Abnormal CT scan, chest 05/12/2013  . Hyperlipidemia 11/30/2012  . History of pulmonary embolus (PE) 07/09/2012  . Long term (current) use of anticoagulants 02/20/2012  . Normocytic anemia 02/08/2012  . Type 2 diabetes mellitus without complication, without long-term current use of insulin ([place]) 02/08/2012  . Asthma 02/08/2012  . GERD (gastroesophageal reflux disease)   . CAD (coronary artery disease) 12/08/2011    Carney Living PT DPT  04/19/2020, 5:18 PM  [facility] [address] [place], [place], [address] Phone: [phone]   Fax:  [phone]  Name: BHUMIKA PELISSIER MRN: [id number] Date of Birth: [birth year]

## 2020-04-23 ENCOUNTER — Ambulatory Visit: Payer: Medicaid Other

## 2020-04-23 ENCOUNTER — Telehealth: Payer: Self-pay | Admitting: Physician Assistant

## 2020-04-23 ENCOUNTER — Other Ambulatory Visit: Payer: Self-pay

## 2020-04-23 DIAGNOSIS — R293 Abnormal posture: Secondary | ICD-10-CM

## 2020-04-23 DIAGNOSIS — M7542 Impingement syndrome of left shoulder: Secondary | ICD-10-CM

## 2020-04-23 DIAGNOSIS — G8929 Other chronic pain: Secondary | ICD-10-CM

## 2020-04-23 DIAGNOSIS — M25512 Pain in left shoulder: Secondary | ICD-10-CM

## 2020-04-23 NOTE — Telephone Encounter (Signed)
Called patient. Aware MRI was authorized with this morning. Gave number to [place] [facility] so she can schedule appt. [phone]

## 2020-04-23 NOTE — Telephone Encounter (Signed)
I did a reconsideration her this a.m and has been approved.

## 2020-04-23 NOTE — Telephone Encounter (Signed)
Patient states her mri was denied by her insurance and want to discuss what that the next step would be for her.

## 2020-04-23 NOTE — Therapy (Addendum)
[place], [place], [address] Phone: [phone]   Fax:  [phone]  Physical Therapy Treatment/DC Summary  Patient Details  Name: [name] MRN: [id number] Date of Birth: 03/21/1961 Referring Provider (PT): Pete Pelt, [place]   Encounter Date: 04/23/2020  PT End of Session - 04/23/20 1514    Visit Number  3    Number of Visits  4    Authorization Type  MCD    Authorization - Visit Number  3    Authorization - Number of Visits  2    PT Start Time  3291    PT Stop Time  1130    PT Time Calculation (min)  38 min    Activity Tolerance  Patient tolerated treatment well    Behavior During Therapy  [facility] for tasks assessed/performed       Past Medical History:  Diagnosis Date  . Anemia   . Aortic valve stenosis, severe   . 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 Asthma   . Blood transfusion    [facility]  . Breast cancer in female [facility])    Right  . CAD (coronary artery disease)    Cath 2010 with DES x 1 RCA-- PT'S CARDIOLOGIST IS DR. [place]  . Chronic diastolic congestive heart failure ([place])   . COPD (chronic obstructive pulmonary disease) ([place])   . Depression   . Diabetes mellitus DIAGNOSED IN2010   Controll s with diet  . Eczema    on back  . Hyperlipidemia   . Hypertension   . Kidney stones   . Morbid obesity with body mass index of 60.0-69.9 in adult [facility])   . Myocardial infarction ([place])    PT THINKS SHE WAS DX WITH MI AT THE TIME OF HEART STENTING  . Pulmonary embolism ([place]) 02/08/12   S/P RT TOTAL KNEE ON 02/03/12--ON 02/08/12--DEVELOPED ACUTE SOB AND CHEST PAIN--AND DIAGNOSED WITH  PULMONARY EMBOLUS AND PNEUMONIA  . Restless leg syndrome   . Sleep apnea    uses 3 liters O2 at night   . Uterine fibroid    NO PROBLEMS AT PRESENT FROM THE FIBROIDS-STATES SHE IS POST  MENOPAUSAL-LAST MENSES 2010 EXCEPT FOR EPISODE THIS YR OF BLEEDING RELATED TO FIBROIDS.  Marland Kitchen Weakness    BOTH HANDS - S/P BILATERAL CARPAL TUNNEL RELEASE--BUT STILL HAS WEAKNESS--OFTEN DROPS THINGS    Past Surgical History:  Procedure Laterality Date  . BREAST LUMPECTOMY WITH RADIOACTIVE SEED AND SENTINEL LYMPH NODE BIOPSY Right 07/19/2018   Procedure: BREAST LUMPECTOMY WITH RADIOACTIVE SEED AND SENTINEL LYMPH NODE BIOPSY;  Surgeon: Alphonsa Overall, MD;  Location: [place];  Service: General;  Laterality: Right;  . CARDIAC CATHETERIZATION    . CARPAL TUNNEL RELEASE     Bilateral  . CHOLECYSTECTOMY    . CORONARY ANGIOPLASTY     2010 has stent in place  . CYSTOSCOPY W/ RETROGRADES Right 09/21/2013   Procedure: CYSTOSCOPY WITH RIGHT RETROGRADE PYELOGRAM RIGHT DOUBLE J STENT ;  Surgeon: Fredricka Bonine, MD;  [facility];  Service: Urology;  Laterality: Right;  . CYSTOSCOPY WITH URETEROSCOPY AND STENT PLACEMENT Right 10/25/2013   Procedure: CYSTOSCOPY RIGHT URETEROSCOPY HOLMIUM LASER LITHO AND STENT PLACEMENT;  Surgeon: Fredricka Bonine, MD;  [facility];  Service: Urology;  Laterality: Right;  . HERNIA REPAIR    .  INTRAOPERATIVE TRANSESOPHAGEAL ECHOCARDIOGRAM N/A 12/12/2014   Procedure: INTRAOPERATIVE TRANSESOPHAGEAL ECHOCARDIOGRAM;  Surgeon: Burnell Blanks, MD;  Location: [facility];  Service: Cardiovascular;  Laterality: N/A;  . JOINT REPLACEMENT     bil total knees  . KNEE ARTHROPLASTY  02/03/2012   Procedure: COMPUTER ASSISTED TOTAL KNEE ARTHROPLASTY;  Surgeon: Mcarthur Rossetti, MD;  Location: [place];  Service: Orthopedics;  Laterality: Right;  Right total knee arthroplasty  . LEFT AND RIGHT HEART CATHETERIZATION WITH CORONARY ANGIOGRAM N/A 03/17/2013   Procedure: LEFT AND RIGHT HEART CATHETERIZATION WITH CORONARY ANGIOGRAM;  Surgeon: Burnell Blanks, MD;  Location: [facility] CATH LAB;  Service: Cardiovascular;  Laterality: N/A;  . LEFT AND RIGHT HEART CATHETERIZATION  WITH CORONARY/GRAFT ANGIOGRAM N/A 09/14/2014   Procedure: LEFT AND RIGHT HEART CATHETERIZATION WITH Beatrix Fetters;  Surgeon: Burnell Blanks, MD;  Location: [facility] CATH LAB;  Service: Cardiovascular;  Laterality: N/A;  . TEE WITHOUT CARDIOVERSION N/A 03/14/2013   Procedure: TRANSESOPHAGEAL ECHOCARDIOGRAM (TEE);  Surgeon: Lelon Perla, MD;  Location: [facility] ENDOSCOPY;  Service: Cardiovascular;  Laterality: N/A;  . TEE WITHOUT CARDIOVERSION N/A 11/14/2014   Procedure: TRANSESOPHAGEAL ECHOCARDIOGRAM (TEE);  Surgeon: Lelon Perla, MD;  Location: [facility] ENDOSCOPY;  Service: Cardiovascular;  Laterality: N/A;  . TOTAL KNEE ARTHROPLASTY  09/10/2012   Procedure: TOTAL KNEE ARTHROPLASTY;  Surgeon: Mcarthur Rossetti, MD;  [facility];  Service: Orthopedics;  Laterality: Left;  . TOTAL KNEE REVISION Right 07/15/2013   Procedure: REVISION ARTHROPLASTY RIGHT KNEE;  Surgeon: Mcarthur Rossetti, MD;  [facility];  Service: Orthopedics;  Laterality: Right;  . TRANSCATHETER AORTIC VALVE REPLACEMENT, TRANSFEMORAL N/A 12/12/2014   Procedure: TRANSCATHETER AORTIC VALVE REPLACEMENT, TRANSFEMORAL;  Surgeon: Burnell Blanks, MD;  Location: [place];  Service: Cardiovascular;  Laterality: N/A;  . TRIGGER FINGER RELEASE  09/10/2012   Procedure: RELEASE TRIGGER FINGER/A-1 PULLEY;  Surgeon: Mcarthur Rossetti, MD;  [facility];  Service: Orthopedics;  Laterality: Right;  Right Ring Finger  . TUBAL LIGATION      There were no vitals filed for this visit.  Subjective Assessment - 04/23/20 1056    Subjective  Pt reports less L shoulder pain. 0/10 at side. 2/59 with certain motions. A little better since the started exs. Pt states ecommendations for sleeping positions and suport have been helpful.    Currently in Pain?  Yes    Pain Location  Shoulder    Pain Orientation  Left;Lateral;Posterior    Pain Descriptors / Indicators  Sharp    Pain Onset  More than a month ago    Pain  Frequency  Intermittent    Aggravating Factors   Reaching behaind    Pain Relieving Factors  Meds, rests    Effect of Pain on Daily Activities  Mod impact on ADLs                       [facility] Adult PT Treatment/Exercise - 04/23/20 0001      Exercises   Exercises  Shoulder      Shoulder Exercises: Supine   Other Supine Exercises  wand flexion and ER (seated) 2x10 each with education how to do er sitting..      Shoulder Exercises: Standing   Extension  Strengthening;Left;10 reps;Theraband    Theraband Level (Shoulder Extension)  Level 1 (Yellow)    Extension Limitations  2 sets    Retraction  Strengthening;Left;10 reps;Theraband    Theraband Level (Shoulder Retraction)  Level 1 (Yellow)  Retraction Weight (lbs)  2 sets    Other Standing Exercises  Hand press down at shoulder level walking bacwrads, then forwrad 5x Janyce Llanos)      Shoulder Exercises: Pulleys   Scaption  1 minute    Scaption Limitations  Elevation as tolerated             PT Education - 04/23/20 1512    Education Details  HEP       PT Short Term Goals - 04/19/20 1513      PT SHORT TERM GOAL #1   Title  Pt will be Ind in an intial HEP.    Time  3    Period  Weeks    Status  New    Target Date  05/03/20      PT SHORT TERM GOAL #2   Title  Pt will report understanding of proper posture and it's affect on shoulder health and reduction of pain.    Baseline  Limited understanding    Time  3    Period  Weeks    Status  On-going    Target Date  05/03/20        PT Long Term Goals - 04/12/20 1123      PT LONG TERM GOAL #1   Title  Pt's L shoulder AROm will improve to 100d to complete dressing and in home overhead activities    Baseline  80d    Time  8    Period  Weeks    Status  New    Target Date  06/08/20      PT LONG TERM GOAL #2   Title  Pt will report an improved pain range of 0-4 for the L shoulder c ADLs for improve qaulity of life    Baseline  4-7/10    Time  8     Period  Weeks    Status  New    Target Date  06/08/20      PT LONG TERM GOAL #3   Title  Pt will report/demonstrate ways/techniques to help manage her L shoulder pain    Baseline  Limited understanding    Time  8    Period  Weeks    Status  New    Target Date  06/08/20      PT LONG TERM GOAL #4   Title  Pt will be Ind in a final HEP for her L shoulderNo program    Baseline  no program    Time  8    Period  Weeks    Status  New    Target Date  06/08/20            Plan - 04/23/20 1517    Clinical Impression Statement  Continue strengthening and ROM exs to reduce pain and improve function. Ed for sleeping position and support appear to be helpful per pt's subjective report.    PT Treatment/Interventions  ADLs/Self Care Home Management;Electrical Stimulation;Moist Heat;Iontophoresis 67m/ml Dexamethasone;Therapeutic activities;Therapeutic exercise;Patient/family education;Manual techniques;Passive range of motion;Dry needling;Taping    PT Next Visit Plan  Re assess for MCD auth    PT [place] Standing shoulder etx and scapular retraction were added to her HEP       Patient will benefit from skilled therapeutic intervention in order to improve the following deficits and impairments:  Decreased strength, Postural dysfunction, Improper body mechanics, Impaired flexibility, Pain, Decreased knowledge of precautions, Impaired UE functional use, Decreased range of motion  Visit Diagnosis: Impingement syndrome of left shoulder  Chronic left shoulder pain  Abnormal posture     Problem List Patient Active Problem List   Diagnosis Date Noted  . Tick bite 04/06/2020  . Stiffness of hand joint 06/09/2019  . Right arm numbness 06/09/2019  . Low back pain 05/02/2019  . Pain of left hip joint 08/09/2018  . Malignant neoplasm of upper-outer quadrant of right breast in female, estrogen receptor positive ([place]) 06/10/2018  . Screening for colon cancer 03/12/2018   . Screening for breast cancer 03/12/2018  . Restless leg syndrome 02/04/2018  . 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 Depression 08/06/2015  . 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 Hypertension associated with diabetes ([place])   . Aortic stenosis   . Morbid obesity with body mass index of 60.0-69.9 in adult [facility])   . Chronic diastolic congestive heart failure ([place])   . COPD (chronic obstructive pulmonary disease) ([place]) 09/21/2013  . Obstructive sleep apnea 09/21/2013  . Nocturnal hypoxemia 09/12/2013  . Abnormal CT scan, chest 05/12/2013  . Hyperlipidemia 11/30/2012  . History of pulmonary embolus (PE) 07/09/2012  . Long term (current) use of anticoagulants 02/20/2012  . Normocytic anemia 02/08/2012  . Type 2 diabetes mellitus without complication, without long-term current use of insulin ([place]) 02/08/2012  . Asthma 02/08/2012  . GERD (gastroesophageal reflux disease)   . CAD (coronary artery disease) 12/08/2011    Gar Ponto MS, PT 04/23/20 3:24 PM    PHYSICAL THERAPY DISCHARGE SUMMARY  Visits from Start of Care: 3  Current functional level related to goals / functional outcomes: Self DC- unknown reason   Remaining deficits: Self DC- unknown reason   Education / Equipment: HEP Plan:                                                    Patient goals were not met. Patient is being discharged due to not returning since the last visit.  ?????       [place], [place], [address] Phone: [phone]   Fax:  [phone]  Name: [name] MRN: [id number] Date of Birth: [birth year]

## 2020-04-23 NOTE — Telephone Encounter (Signed)
Is this patient waiting for P2P or reconsideration?

## 2020-04-30 ENCOUNTER — Ambulatory Visit: Payer: Medicaid Other

## 2020-05-01 ENCOUNTER — Ambulatory Visit: Payer: Medicaid Other | Attending: Internal Medicine

## 2020-05-01 DIAGNOSIS — Z23 Encounter for immunization: Secondary | ICD-10-CM

## 2020-05-01 NOTE — Progress Notes (Signed)
   Covid-19 Vaccination Clinic  Name:  [name]    MRN: [id number] DOB: Mar 09, 1961  05/01/2020  [name] was observed post Covid-19 immunization for 15 minutes without incident. She was provided with Vaccine Information Sheet and instruction to access the V-Safe system.   [name] was instructed to call 911 with any severe reactions post vaccine: Marland Kitchen Difficulty breathing  . Swelling of face and throat  . A fast heartbeat  . A bad rash all over body  . Dizziness and weakness   Immunizations Administered    Name Date Dose VIS Date Route   Pfizer COVID-19 Vaccine 05/01/2020 12:46 PM 0.3 mL 02/22/2019 Intramuscular   Manufacturer: [place]   Lot: [id number]   [place]: [id number]

## 2020-05-02 DIAGNOSIS — H2513 Age-related nuclear cataract, bilateral: Secondary | ICD-10-CM | POA: Diagnosis not present

## 2020-05-07 ENCOUNTER — Encounter: Payer: Self-pay | Admitting: *Deleted

## 2020-05-09 ENCOUNTER — Telehealth: Payer: Self-pay | Admitting: Internal Medicine

## 2020-05-09 ENCOUNTER — Telehealth: Payer: Self-pay | Admitting: *Deleted

## 2020-05-09 ENCOUNTER — Ambulatory Visit (HOSPITAL_COMMUNITY)
Admission: EM | Admit: 2020-05-09 | Discharge: 2020-05-09 | Disposition: A | Discharge status: Home / Self Care | Payer: Medicaid Other | Attending: Family Medicine | Admitting: Family Medicine

## 2020-05-09 ENCOUNTER — Encounter (HOSPITAL_COMMUNITY): Payer: Self-pay

## 2020-05-09 ENCOUNTER — Other Ambulatory Visit: Payer: Self-pay

## 2020-05-09 DIAGNOSIS — M533 Sacrococcygeal disorders, not elsewhere classified: Secondary | ICD-10-CM | POA: Diagnosis not present

## 2020-05-09 MED ORDER — HYDROCODONE-ACETAMINOPHEN 7.5-325 MG PO TABS: 1.0000 | ORAL_TABLET | Freq: Four times a day (QID) | ORAL | 0 refills | Status: DC | PRN

## 2020-05-09 NOTE — ED Triage Notes (Signed)
Patient complains of low back pain and tailbone pain that started after a fall that occurred on Saturday. Patient states that she is in constant pain since. Reports that she has been taking Tylenol without relief.

## 2020-05-09 NOTE — Discharge Instructions (Addendum)
Be aware, you have been prescribed pain medications that may cause drowsiness. While taking this medication, do not take any other medications containing acetaminophen (Tylenol). Do not combine with alcohol or other illicit drugs. Please do not drive, operate heavy machinery, or take part in activities that require making important decisions while on this medication as your judgement may be clouded.  

## 2020-05-09 NOTE — Telephone Encounter (Signed)
rtc to pt, states she had a hard fall Saturday and her coccyx area hurts really bad, she was advised to go to urg care and did so

## 2020-05-09 NOTE — Telephone Encounter (Signed)
Pt called / stated she felled Saturday evening and hurt her tailbone. Stated it hurts to sit; she tried heat/Tylenol. Informed we do not have any appts today. Stated she does have an ortho doctor, Dr Ninfa Linden, at [facility]. Informed pt to call his office for their recommendation. And if she needs to schedule an appt here, to give [place] a call back. Pt voiced she understands.

## 2020-05-09 NOTE — Telephone Encounter (Signed)
Pls contact pt [phone] 

## 2020-05-10 NOTE — ED Provider Notes (Signed)
[facility]   [id number] 05/09/20 Arrival Time: [id number]  ASSESSMENT & PLAN:  1. Coccyx pain     Able to ambulate here and hemodynamically stable. Discussed this may take some time to improve.  Meds ordered this encounter  Medications  . HYDROcodone-acetaminophen (NORCO) 7.5-325 MG tablet    Sig: Take 1 tablet by mouth every 6 (six) hours as needed for moderate pain or severe pain.    Dispense:  12 tablet    Refill:  0    Medication sedation precautions given. Encourage ROM/movement as tolerated.  Recommend: Follow-up Information    Earlene Plater, MD.   Specialty: Internal Medicine Why: If worsening or failing to improve as anticipated. Contact information: [address] [place] [address] [phone]          [place] Controlled Substances Registry consulted for this patient. I feel the risk/benefit ratio today is favorable for proceeding with this prescription for a controlled substance. Medication sedation precautions given.  Reviewed expectations re: course of current medical issues. Questions answered. Outlined signs and symptoms indicating need for more acute intervention. Patient verbalized understanding. After Visit Summary given.   SUBJECTIVE: History from: patient.  [name] is a 59 y.o. female who presents with complaint of pain over tailbone after falling on that area several days ago. Ambulatory. Pain worse with sitting. Affecting sleep. No extremity sensation changes or weakness. Normal bowel/bladder habits. Tylenol without much relief. Pain is now persistent.    OBJECTIVE:  Vitals:   05/09/20 1343 05/09/20 1346  BP:  (!) 141/62  Pulse:  89  Resp:  18  Temp:  98.3 F (36.8 C)  TempSrc:  Oral  SpO2:  95%  Weight: 136.1 kg   Height: 5\' 3"  (1.6 m)     General appearance: alert; no distress but appears uncomfortable Lungs: unlabored respirations Abdomen: morbidly obese Back: reports pain over  coccyx Extremities: moves all extremities normally Skin: warm and dry Neurologic: normal gait; normal sensation and strength of bilateral LE Psychological: alert and cooperative; normal mood and affect    No Known Allergies  Past Medical History:  Diagnosis Date  . Anemia   . Aortic valve stenosis, severe   . 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 Asthma   . Blood transfusion    2013[facility]  . Breast cancer in female [facility])    Right  . CAD (coronary artery disease)    Cath 2010 with DES x 1 RCA-- PT'S CARDIOLOGIST IS DR. Conconully  . Chronic diastolic congestive heart failure ([place])   . COPD (chronic obstructive pulmonary disease) ([place])   . Depression   . Diabetes mellitus DIAGNOSED IN2010   Controll s with diet  . Eczema    on back  . Hyperlipidemia   . Hypertension   . Kidney stones   . Morbid obesity with body mass index of 60.0-69.9 in adult [facility])   . Myocardial infarction ([place])    PT THINKS SHE WAS DX WITH MI AT THE TIME OF HEART STENTING  . Pulmonary embolism ([place]) 02/08/12   S/P RT TOTAL KNEE ON 02/03/12--ON 02/08/12--DEVELOPED ACUTE SOB AND CHEST PAIN--AND DIAGNOSED WITH  PULMONARY EMBOLUS AND PNEUMONIA  . Restless leg syndrome   . Sleep apnea    uses 3 liters O2 at night   . Uterine fibroid    NO  PROBLEMS AT PRESENT FROM THE FIBROIDS-STATES SHE IS POST MENOPAUSAL-LAST MENSES 2010 EXCEPT FOR EPISODE THIS YR OF BLEEDING RELATED TO FIBROIDS.  Marland Kitchen Weakness    BOTH HANDS - S/P BILATERAL CARPAL TUNNEL RELEASE--BUT STILL HAS WEAKNESS--OFTEN DROPS THINGS   Social History   Socioeconomic History  . Marital status: Married    Spouse name: Not on file  . Number of children: 2  . Years of education: Not on file  . Highest education level: Not on file  Occupational History  . Occupation: Disabled  Tobacco Use  . Smoking status: Former Smoker    Packs/day: 1.50     Years: 30.00    Pack years: 45.00    Types: Cigarettes    Quit date: 12/29/2000    Years since quitting: 19.3  . Smokeless tobacco: Never Used  Substance and Sexual Activity  . Alcohol use: Yes    Comment: rarely  . Drug use: No  . Sexual activity: Not Currently    Birth control/protection: Surgical  Other Topics Concern  . Not on file  Social History Narrative  . Not on file   Social Determinants of Health   Financial Resource Strain:   . Difficulty of Paying Living Expenses:   Food Insecurity:   . Worried About Charity fundraiser in the Last Year:   . Arboriculturist in the Last Year:   Transportation Needs:   . Film/video editor (Medical):   Marland Kitchen Lack of Transportation (Non-Medical):   Physical Activity:   . Days of Exercise per Week:   . Minutes of Exercise per Session:   Stress:   . Feeling of Stress :   Social Connections:   . Frequency of Communication with Friends and Family:   . Frequency of Social Gatherings with Friends and Family:   . Attends Religious Services:   . Active Member of Clubs or Organizations:   . Attends Archivist Meetings:   Marland Kitchen Marital Status:   Intimate Partner Violence:   . Fear of Current or Ex-Partner:   . Emotionally Abused:   Marland Kitchen Physically Abused:   . Sexually Abused:    Family History  Problem Relation Age of Onset  . Breast cancer Mother        stage IV at diagnosis  . Emphysema Mother        smoked  . Heart disease Mother   . COPD Father        smoked  . Asthma Father   . Heart disease Father   . Cancer Brother        Sinus   Past Surgical History:  Procedure Laterality Date  . BREAST LUMPECTOMY WITH RADIOACTIVE SEED AND SENTINEL LYMPH NODE BIOPSY Right 07/19/2018   Procedure: BREAST LUMPECTOMY WITH RADIOACTIVE SEED AND SENTINEL LYMPH NODE BIOPSY;  Surgeon: Alphonsa Overall, MD;  Location: [facility];  Service: General;  Laterality: Right;  . CARDIAC CATHETERIZATION    . CARPAL TUNNEL RELEASE     Bilateral  .  CHOLECYSTECTOMY    . CORONARY ANGIOPLASTY     2010 has stent in place  . CYSTOSCOPY W/ RETROGRADES Right 09/21/2013   Procedure: CYSTOSCOPY WITH RIGHT RETROGRADE PYELOGRAM RIGHT DOUBLE J STENT ;  Surgeon: Fredricka Bonine, MD;  [facility];  Service: Urology;  Laterality: Right;  . CYSTOSCOPY WITH URETEROSCOPY AND STENT PLACEMENT Right 10/25/2013   Procedure: CYSTOSCOPY RIGHT URETEROSCOPY HOLMIUM LASER LITHO AND STENT PLACEMENT;  Surgeon: Fredricka Bonine, MD;  Location: [facility];  Service: Urology;  Laterality: Right;  . HERNIA REPAIR    . INTRAOPERATIVE TRANSESOPHAGEAL ECHOCARDIOGRAM N/A 12/12/2014   Procedure: INTRAOPERATIVE TRANSESOPHAGEAL ECHOCARDIOGRAM;  Surgeon: Burnell Blanks, MD;  Location[facility] OR;  Service: Cardiovascular;  Laterality: N/A;  . JOINT REPLACEMENT     bil total knees  . KNEE ARTHROPLASTY  02/03/2012   Procedure: COMPUTER ASSISTED TOTAL KNEE ARTHROPLASTY;  Surgeon: Mcarthur Rossetti, MD;  Location: [facility];  Service: Orthopedics;  Laterality: Right;  Right total knee arthroplasty  . LEFT AND RIGHT HEART CATHETERIZATION WITH CORONARY ANGIOGRAM N/A 03/17/2013   Procedure: LEFT AND RIGHT HEART CATHETERIZATION WITH CORONARY ANGIOGRAM;  Surgeon: Burnell Blanks, MD;  Location: [facility] CATH LAB;  Service: Cardiovascular;  Laterality: N/A;  . LEFT AND RIGHT HEART CATHETERIZATION WITH CORONARY/GRAFT ANGIOGRAM N/A 09/14/2014   Procedure: LEFT AND RIGHT HEART CATHETERIZATION WITH Beatrix Fetters;  Surgeon: Burnell Blanks, MD;  Location: [facility] CATH LAB;  Service: Cardiovascular;  Laterality: N/A;  . TEE WITHOUT CARDIOVERSION N/A 03/14/2013   Procedure: TRANSESOPHAGEAL ECHOCARDIOGRAM (TEE);  Surgeon: Lelon Perla, MD;  Location: [facility] ENDOSCOPY;  Service: Cardiovascular;  Laterality: N/A;  . TEE WITHOUT CARDIOVERSION N/A 11/14/2014   Procedure: TRANSESOPHAGEAL ECHOCARDIOGRAM (TEE);  Surgeon: Lelon Perla, MD;  Location: [facility] ENDOSCOPY;  Service:  Cardiovascular;  Laterality: N/A;  . TOTAL KNEE ARTHROPLASTY  09/10/2012   Procedure: TOTAL KNEE ARTHROPLASTY;  Surgeon: Mcarthur Rossetti, MD;  [facility];  Service: Orthopedics;  Laterality: Left;  . TOTAL KNEE REVISION Right 07/15/2013   Procedure: REVISION ARTHROPLASTY RIGHT KNEE;  Surgeon: Mcarthur Rossetti, MD;  [facility];  Service: Orthopedics;  Laterality: Right;  . TRANSCATHETER AORTIC VALVE REPLACEMENT, TRANSFEMORAL N/A 12/12/2014   Procedure: TRANSCATHETER AORTIC VALVE REPLACEMENT, TRANSFEMORAL;  Surgeon: Burnell Blanks, MD;  Location: [facility];  Service: Cardiovascular;  Laterality: N/A;  . TRIGGER FINGER RELEASE  09/10/2012   Procedure: RELEASE TRIGGER FINGER/A-1 PULLEY;  Surgeon: Mcarthur Rossetti, MD;  [facility];  Service: Orthopedics;  Laterality: Right;  Right Ring Finger  . TUBAL LIGATION       Vanessa Kick, MD 05/10/20 1121

## 2020-05-15 NOTE — Telephone Encounter (Signed)
Has f/u in [facility] 5/19 at 1515

## 2020-05-16 ENCOUNTER — Ambulatory Visit (INDEPENDENT_AMBULATORY_CARE_PROVIDER_SITE_OTHER): Payer: Medicaid Other | Admitting: Internal Medicine

## 2020-05-16 ENCOUNTER — Encounter: Payer: Self-pay | Admitting: Internal Medicine

## 2020-05-16 ENCOUNTER — Other Ambulatory Visit: Payer: Self-pay

## 2020-05-16 DIAGNOSIS — M545 Low back pain, unspecified: Secondary | ICD-10-CM

## 2020-05-16 MED ORDER — CYCLOBENZAPRINE HCL 5 MG PO TABS: 5.0000 mg | ORAL_TABLET | Freq: Three times a day (TID) | ORAL | 0 refills | Status: DC | PRN

## 2020-05-16 NOTE — Progress Notes (Signed)
CC: back pain  HPI:  [name] is a 59 y.o. female with PMHx as listed below presenting with back pain following a fall on 5/9. She reports that she was evaluated at urgent care and recommended for MRI lumbar spine which she has scheduled on 5/25. She does endorse continued lower back pain but denies any further concerns. Please see encounters tab for further assessment and plan.  Past Medical History:  Diagnosis Date  . Anemia   . Aortic valve stenosis, severe   . 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 Asthma   . Blood transfusion    2013[facility]  . Breast cancer in female [facility])    Right  . CAD (coronary artery disease)    Cath 2010 with DES x 1 RCA-- PT'S CARDIOLOGIST IS DR. [place]  . Chronic diastolic congestive heart failure ([place])   . COPD (chronic obstructive pulmonary disease) ([place])   . Depression   . Diabetes mellitus DIAGNOSED IN2010   Controll s with diet  . Eczema    on back  . Hyperlipidemia   . Hypertension   . Kidney stones   . Morbid obesity with body mass index of 60.0-69.9 in adult [facility])   . Myocardial infarction ([place])    PT THINKS SHE WAS DX WITH MI AT THE TIME OF HEART STENTING  . Pulmonary embolism ([place]) 02/08/12   S/P RT TOTAL KNEE ON 02/03/12--ON 02/08/12--DEVELOPED ACUTE SOB AND CHEST PAIN--AND DIAGNOSED WITH  PULMONARY EMBOLUS AND PNEUMONIA  . Restless leg syndrome   . Sleep apnea    uses 3 liters O2 at night   . Uterine fibroid    NO PROBLEMS AT PRESENT FROM THE FIBROIDS-STATES SHE IS POST MENOPAUSAL-LAST MENSES 2010 EXCEPT FOR EPISODE THIS YR OF BLEEDING RELATED TO FIBROIDS.  Marland Kitchen Weakness    BOTH HANDS - S/P BILATERAL CARPAL TUNNEL RELEASE--BUT STILL HAS WEAKNESS--OFTEN DROPS THINGS   Review of Systems:  Negative except as stated in HPI.  Physical Exam:  Vitals:   05/16/20 1529  BP: (!) 147/77  Pulse: 92  Temp: 98.1 F (36.7  C)  TempSrc: Oral  SpO2: 95%  Weight: (!) 311 lb 3.2 oz (141.2 kg)  Height: 5\' 3"  (1.6 m)   Physical Exam Constitutional:      General: She is not in acute distress.    Appearance: She is obese. She is not ill-appearing or diaphoretic.  Cardiovascular:     Rate and Rhythm: Normal rate and regular rhythm.     Pulses: Normal pulses.     Heart sounds: Normal heart sounds. No murmur. No gallop.   Pulmonary:     Effort: Pulmonary effort is normal. No respiratory distress.     Breath sounds: Normal breath sounds. No wheezing.  Musculoskeletal:        General: Tenderness present. No swelling or deformity. Normal range of motion.     Comments: Right lower back and lumbar region without bony tenderness noted   Skin:    General: Skin is warm and dry.     Capillary Refill: Capillary refill takes less than 2 seconds.     Findings: No bruising or lesion.  Neurological:     General: No focal deficit present.     Mental Status: She is alert and oriented to person, place, and time. Mental status is at baseline.  Sensory: No sensory deficit.     Motor: No weakness.     Coordination: Coordination normal.     Gait: Gait normal.      Assessment & Plan:   See Encounters Tab for problem based charting.  Patient discussed with Dr. Lynnae January

## 2020-05-16 NOTE — Patient Instructions (Signed)
Ms. Laforgia,  It was a pleasure seeing you in clinic.  Today we discussed:  Back pain: I am sorry to hear that you are having lower back pain after your fall.  I am prescribing muscle relaxer.  Please take this 3 times daily as needed.  Please continue with heat pad.  Follow-up with MRI on May 25.  We will schedule you an appointment following your MRI.  if your pain worsens or if you develop any urinary or fecal incontinence, please contact us.  Please contact us if you have any questions or concerns. Thank you!

## 2020-05-17 NOTE — Assessment & Plan Note (Signed)
Patient with history of chronic low back pain in setting of degenerative changes presenting with acute worsening of back pain since 5/9 following a mechanical fall onto her buttocks.  Patient notes that she was evaluated at urgent care for this, and recommended for MRI of her lumbar spine which she has scheduled on 5/25.  She notes that the pain is localized to the right lower back and into the coccyx area without any radiation to her legs.  She denies any saddle anesthesia, urinary or fecal incontinence, fevers or chills, or gait imbalances.  She has been using heating pad and taking her hydrocodone as needed for pain with some relief.  On examination, tenderness to palpation noted on the right lower back without any bony tenderness noted on the lumbar spine.  No focal deficits noted in lower extremities and sensations grossly intact in bilateral lower extremities.   Plan Follow-up MRI lumbar spine on 5/25 Flexeril 5 mg 3 times daily as needed

## 2020-05-21 NOTE — Progress Notes (Signed)
Internal Medicine Clinic Attending  Case discussed with Dr. Aslam at the time of the visit.  We reviewed the resident's history and exam and pertinent patient test results.  I agree with the assessment, diagnosis, and plan of care documented in the resident's note.  

## 2020-05-22 ENCOUNTER — Ambulatory Visit
Admission: RE | Admit: 2020-05-22 | Discharge: 2020-05-22 | Disposition: A | Discharge status: Home / Self Care | Payer: Medicaid Other | Source: Ambulatory Visit | Attending: Physician Assistant | Admitting: Physician Assistant

## 2020-05-22 DIAGNOSIS — G8929 Other chronic pain: Secondary | ICD-10-CM

## 2020-05-22 DIAGNOSIS — M48061 Spinal stenosis, lumbar region without neurogenic claudication: Secondary | ICD-10-CM | POA: Diagnosis not present

## 2020-05-22 DIAGNOSIS — M545 Low back pain, unspecified: Secondary | ICD-10-CM

## 2020-05-22 IMAGING — MR MR LUMBAR SPINE W/O CM
4 of 5 series · 25 of 48 positions shown · non-contrast
Comparison: Lumbar radiographs [DATE]. CT Abdomen and Pelvis
[DATE].

CLINICAL DATA: 58-year-old female with central mid to low back pain
radiating to the sacrum for months to years. Numbness. No prior
surgery.

EXAM:
MRI LUMBAR SPINE WITHOUT CONTRAST
TECHNIQUE: Multiplanar, multisequence MR imaging of the lumbar spine was
performed. No intravenous contrast was administered.

[Series 3: T2 · sagittal · 4.0mm · 0.55mm/px · 6 of 15 slices shown (1 of 2)]
[im 1/15]
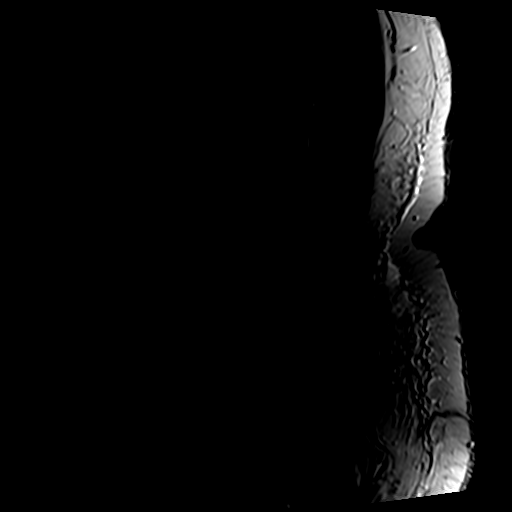
[im 3/15]
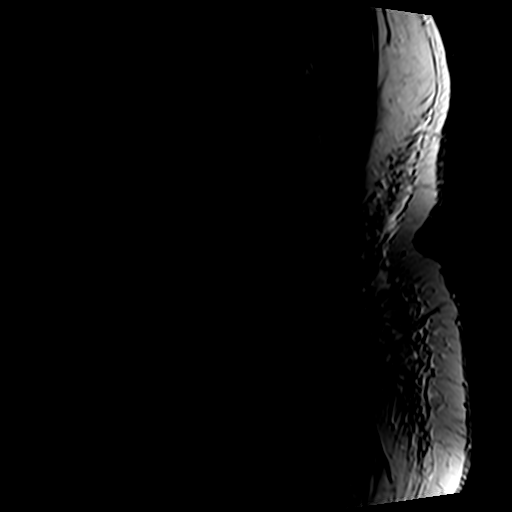
[im 6/15]
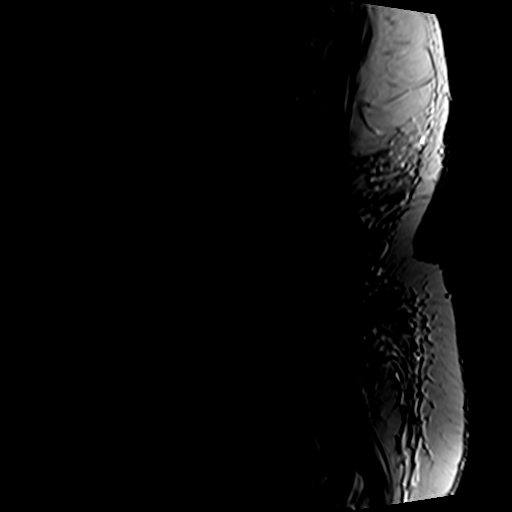
[im 9/15]
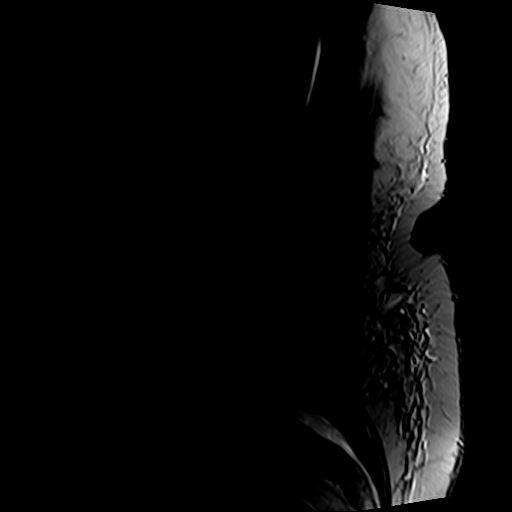
[im 12/15]
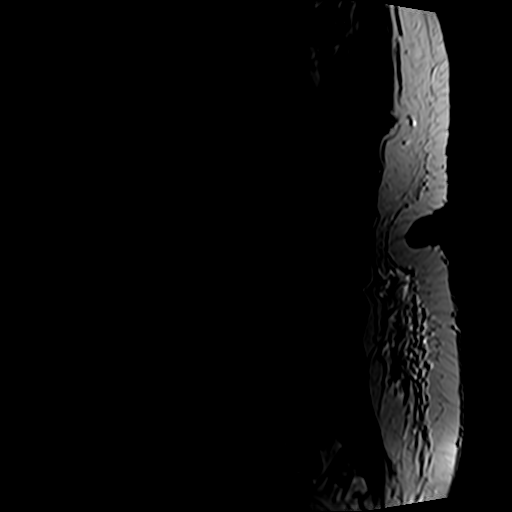
[im 15/15]
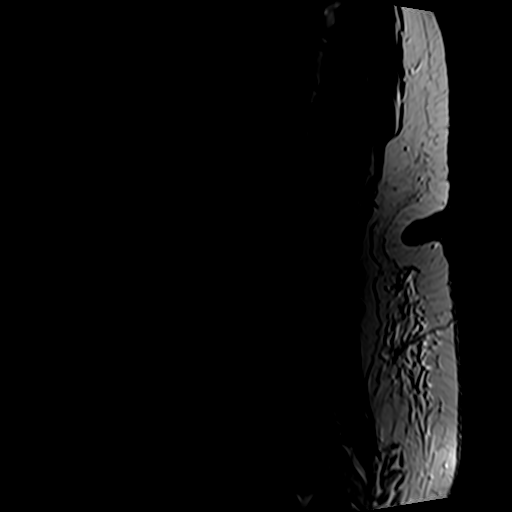

[Series 4: T1 · sagittal · 4.0mm · 0.55mm/px · 6 of 15 slices shown (1 of 2)]
[im 1/15]
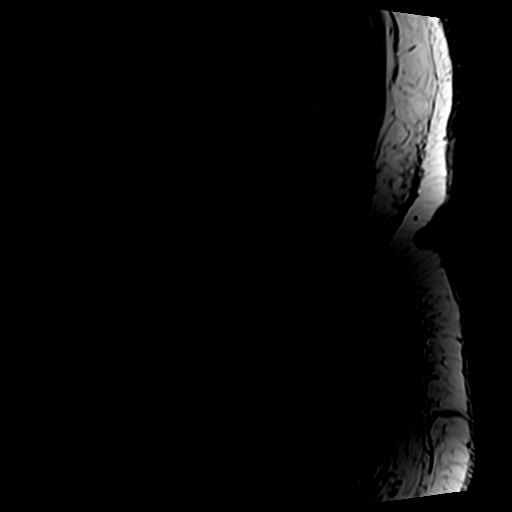
[im 3/15]
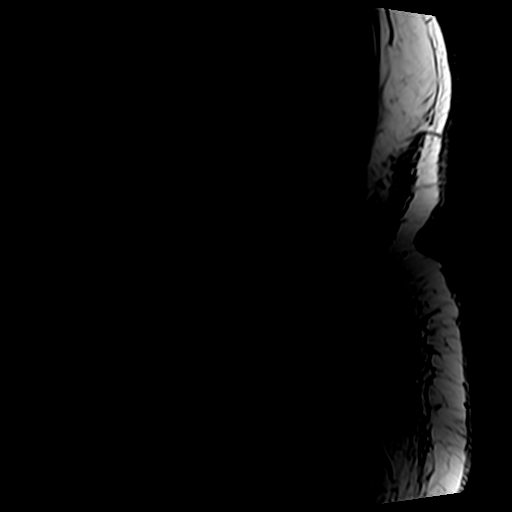
[im 6/15]
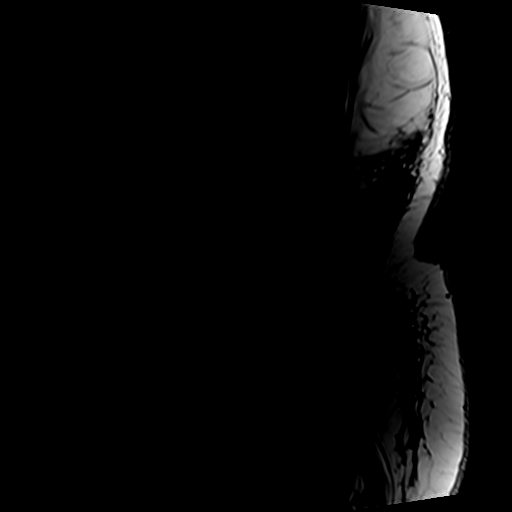
[im 9/15]
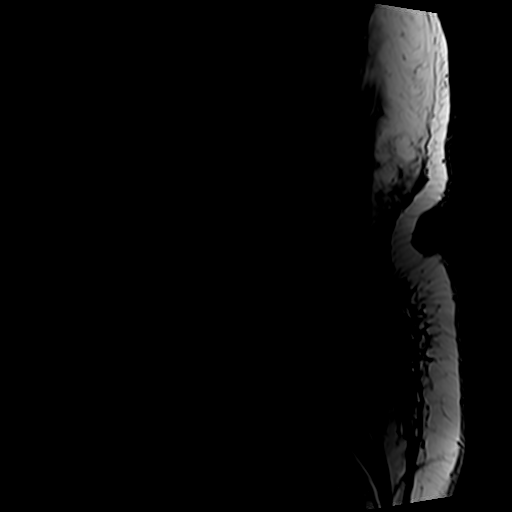
[im 12/15]
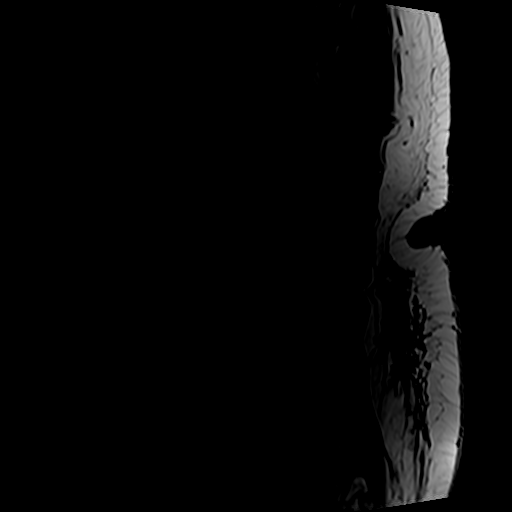
[im 15/15]
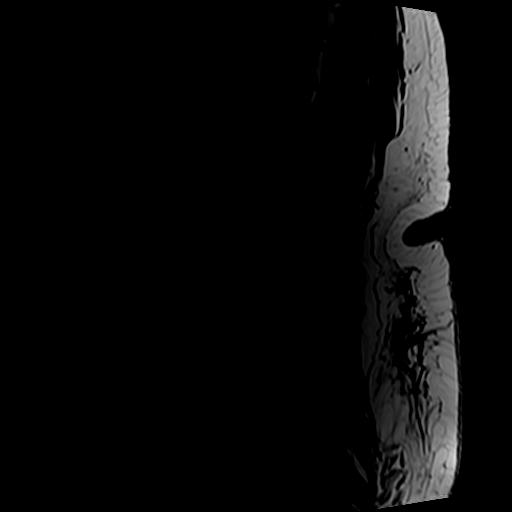

[Series 6: T2 · axial · 4.0mm · 0.70mm/px · z∈[-122,+95]mm · 9 of 39 slices shown (2 of 2)]
[im 1/39]
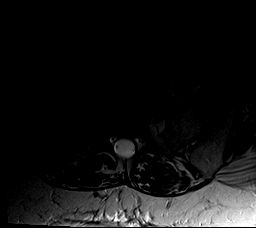
[im 6/39]
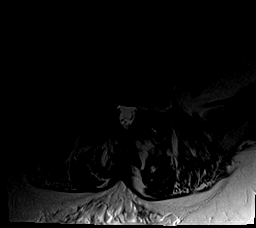
[im 11/39]
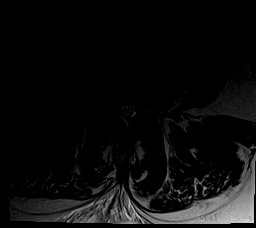
[im 17/39]
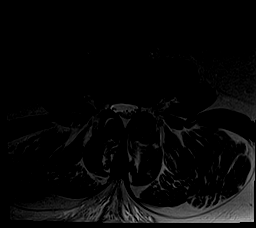
[im 20/39]
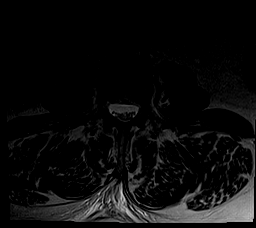
[im 22/39]
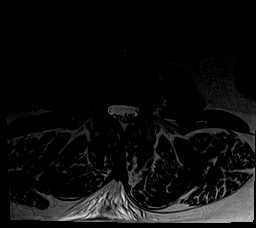
[im 28/39]
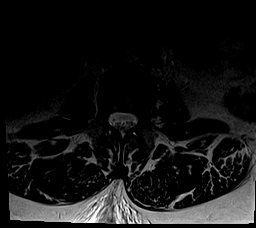
[im 33/39]
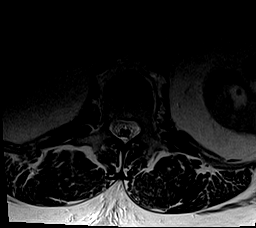
[im 39/39]
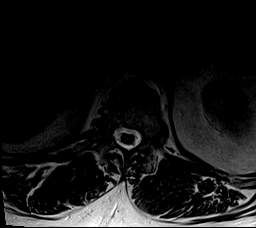

[Series 7: T1 · axial · 4.0mm · 0.35mm/px · z∈[-122,+65]mm · 4 of 39 slices shown (2 of 2)]
[im 1/39]
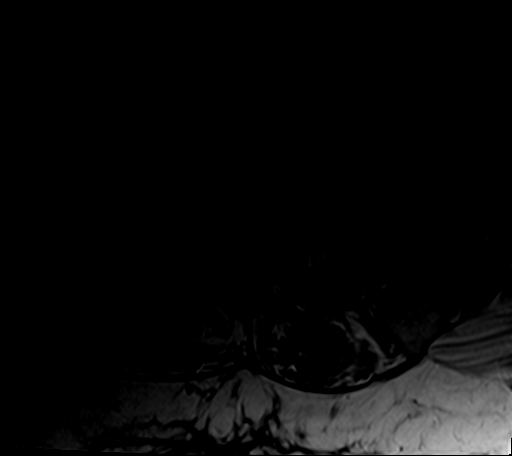
[im 6/39]
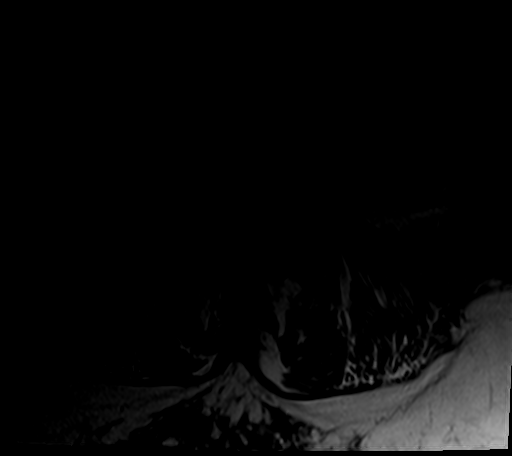
[im 20/39]
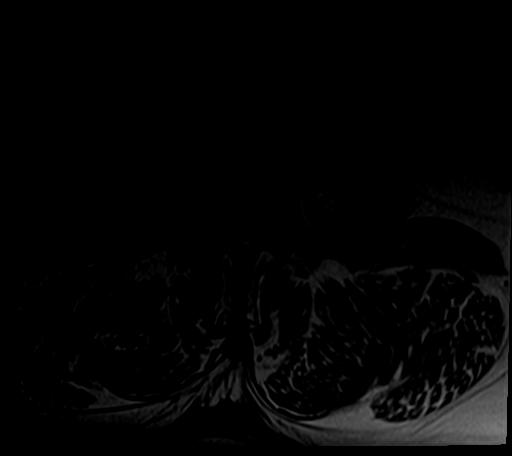
[im 33/39]
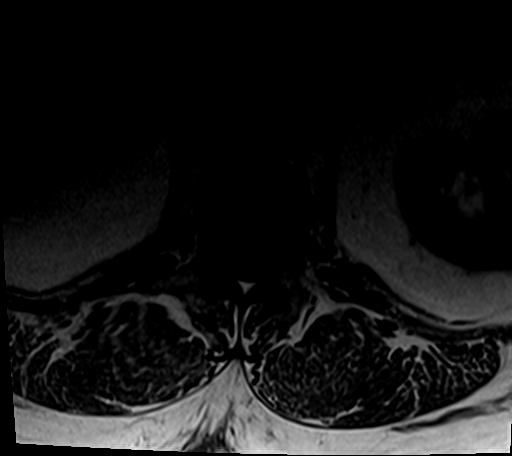

[25 of 48 positions shown; findings below may reference images not displayed]

FINDINGS: Segmentation:  Normal on the comparisons.

Alignment: Grade 1 anterolisthesis at both L4-L5 and L5-S1 was more
apparent on the weight-bearing lumbar radiographs. Lumbar lordosis
otherwise appears stable since [V2].

Vertebrae: No pars fracture identified. No marrow edema or evidence
of acute osseous abnormality. Chronic degenerative endplate marrow
signal changes T12-L1 and L1-L2. Normal background bone marrow
signal. Intact visible sacrum and SI joints.

Conus medullaris and cauda equina: Conus extends to the L1 level. C
T12-L1 level details below. No lower spinal cord or conus signal
abnormality.

Paraspinal and other soft tissues: Negative.

Disc levels:

T11-T12. Mild disc bulging and facet hypertrophy. No stenosis.

T12-L1: Severe chronic disc degeneration with chronic vacuum disc.
Circumferential disc osteophyte complex and mild posterior element
hypertrophy. Mild spinal stenosis at the level of the conus (series
3, image 8 and series 6, image 5). No conus mass effect. Mild to
moderate left T12 foraminal stenosis.

L1-L2:  Minor disc bulge. No stenosis.

L2-L3:  Negative.

L3-L4: Mild disc desiccation. Mild facet and ligament flavum
hypertrophy. No stenosis.

L4-L5: Circumferential but mostly far lateral disc bulging. Moderate
facet hypertrophy. Vacuum facet here in [V2]. No spinal or lateral
recess stenosis. Mild to moderate left and borderline to mild right
L4 foraminal stenosis.

L5-S1: Minor disc bulge. Severe facet hypertrophy. Vacuum facet here
in [V2]. No spinal or lateral recess stenosis. Mild to moderate
bilateral L5 foraminal stenosis.
IMPRESSION: 1. No acute osseous abnormality in the lumbar spine. But grade 1
spondylolisthesis at L4-L5 and L5-S1 as seen on the weight-bearing
radiographs this year is associated with severe chronic facet
arthropathy. No spinal stenosis, but mild to moderate L4 and L5
foraminal stenosis.

2. Severe chronic disc degeneration at T12-L1 with mild spinal
stenosis there at the level of the conus. No conus mass effect. Mild
to moderate left T12 neural foraminal stenosis.

3. Mild for age lumbar spine degeneration elsewhere.

## 2020-05-26 ENCOUNTER — Other Ambulatory Visit: Payer: Self-pay | Admitting: Internal Medicine

## 2020-05-26 DIAGNOSIS — J441 Chronic obstructive pulmonary disease with (acute) exacerbation: Secondary | ICD-10-CM

## 2020-05-27 ENCOUNTER — Other Ambulatory Visit: Payer: Self-pay | Admitting: Internal Medicine

## 2020-05-27 DIAGNOSIS — I5032 Chronic diastolic (congestive) heart failure: Secondary | ICD-10-CM

## 2020-05-29 ENCOUNTER — Ambulatory Visit (INDEPENDENT_AMBULATORY_CARE_PROVIDER_SITE_OTHER): Payer: Medicaid Other | Admitting: Orthopaedic Surgery

## 2020-05-29 ENCOUNTER — Other Ambulatory Visit: Payer: Self-pay

## 2020-05-29 ENCOUNTER — Encounter: Payer: Self-pay | Admitting: Orthopaedic Surgery

## 2020-05-29 DIAGNOSIS — M545 Low back pain, unspecified: Secondary | ICD-10-CM

## 2020-05-29 DIAGNOSIS — G8929 Other chronic pain: Secondary | ICD-10-CM

## 2020-05-29 NOTE — Progress Notes (Signed)
The patient comes in today to go over an MRI of her lumbar spine.  She is a 59 year old female has been dealing with chronic worsening spine pain.  It hurts both in the upper lumbar spine and lower.  MRIs reviewed with her and it does show severe degenerative disc disease at T8 12 L1.  This could be affecting the left T12 nerve root.  There is also grade 1 spondylolisthesis at L4-L5 and L5-S1 due to severe facet disease.  This is causing some moderate foraminal stenosis at the L4-L5 level.  She is someone with a BMI of over 40 and she weighs 311 pounds.  This is also likely contributing to her back pain.  From my standpoint, I would recommend is physical therapy, which she is adverse to trying since it did not help for her shoulder.  I have certainly advocated weight loss as well as core strengthening exercises.  She is interested in at least trying an intervention by Dr. Ernestina Patches such as an ESI.  I am not sure if this should be higher up at the left T12 area versus lower at L4-L5.  We will work on getting that scheduled.

## 2020-05-30 ENCOUNTER — Telehealth: Payer: Self-pay

## 2020-05-30 ENCOUNTER — Telehealth: Payer: Self-pay | Admitting: Orthopaedic Surgery

## 2020-05-30 MED ORDER — ACETAMINOPHEN-CODEINE #3 300-30 MG PO TABS: 1.0000 | ORAL_TABLET | Freq: Three times a day (TID) | ORAL | 0 refills | Status: DC | PRN

## 2020-05-30 NOTE — Telephone Encounter (Signed)
Called pt and spoke to her, she states she has been seeing an ortho for "her cracked tail bone" she is finding she needs something for pain and would like for imc doctor to call something in. She is advised to call the ortho md and speak w/ his office since they are providing care to her for this injury Do you agree?

## 2020-05-30 NOTE — Telephone Encounter (Signed)
Please advise 

## 2020-05-30 NOTE — Telephone Encounter (Signed)
Yes, I agree. Thank you!

## 2020-05-30 NOTE — Telephone Encounter (Signed)
Patient called advised she fell the day before Mother's Day and have a Coccyx Fx. Patient said Dr Ninfa Linden know that she fell and asked if Dr Ninfa Linden would call her in some pain medicine. Patient said she uses the [facility] on [address]. The number to contact patient is [phone]

## 2020-05-30 NOTE — Telephone Encounter (Signed)
Requesting to speak with a nurse about getting pain med. Please call pt back.

## 2020-05-31 ENCOUNTER — Other Ambulatory Visit: Payer: Self-pay | Admitting: *Deleted

## 2020-05-31 MED ORDER — PEN NEEDLES 32G X 4 MM MISC: 1.8000 mg | Freq: Every day | 2 refills | Status: DC

## 2020-06-04 ENCOUNTER — Telehealth: Payer: Self-pay | Admitting: Physical Medicine and Rehabilitation

## 2020-06-04 NOTE — Telephone Encounter (Signed)
Pt called returning a call from 06/01/20 regarding making an appt.   [phone]

## 2020-06-05 ENCOUNTER — Telehealth: Payer: Self-pay | Admitting: *Deleted

## 2020-06-05 NOTE — Telephone Encounter (Signed)
Pt is scheduled 06/26/20 with driver.

## 2020-06-05 NOTE — Telephone Encounter (Signed)
That is fine. The patient should continue her Plavix right after the procedure is finished. Thank you

## 2020-06-14 ENCOUNTER — Telehealth: Payer: Self-pay

## 2020-06-14 ENCOUNTER — Other Ambulatory Visit: Payer: Self-pay

## 2020-06-14 ENCOUNTER — Ambulatory Visit
Admission: RE | Admit: 2020-06-14 | Discharge: 2020-06-14 | Disposition: A | Discharge status: Home / Self Care | Payer: Medicaid Other | Source: Ambulatory Visit | Attending: Surgery | Admitting: Surgery

## 2020-06-14 DIAGNOSIS — R928 Other abnormal and inconclusive findings on diagnostic imaging of breast: Secondary | ICD-10-CM | POA: Diagnosis not present

## 2020-06-14 DIAGNOSIS — C50911 Malignant neoplasm of unspecified site of right female breast: Secondary | ICD-10-CM

## 2020-06-14 IMAGING — MG DIGITAL DIAGNOSTIC BILAT W/ TOMO W/ CAD
8 of 12 series · 8 of 32 positions shown · non-contrast
Comparison: Previous exam(s).

CLINICAL DATA: Right lumpectomy.  Annual mammography.

EXAM:
DIGITAL DIAGNOSTIC BILATERAL MAMMOGRAM WITH TOMO AND CAD

[R MLO (1 of 2)]
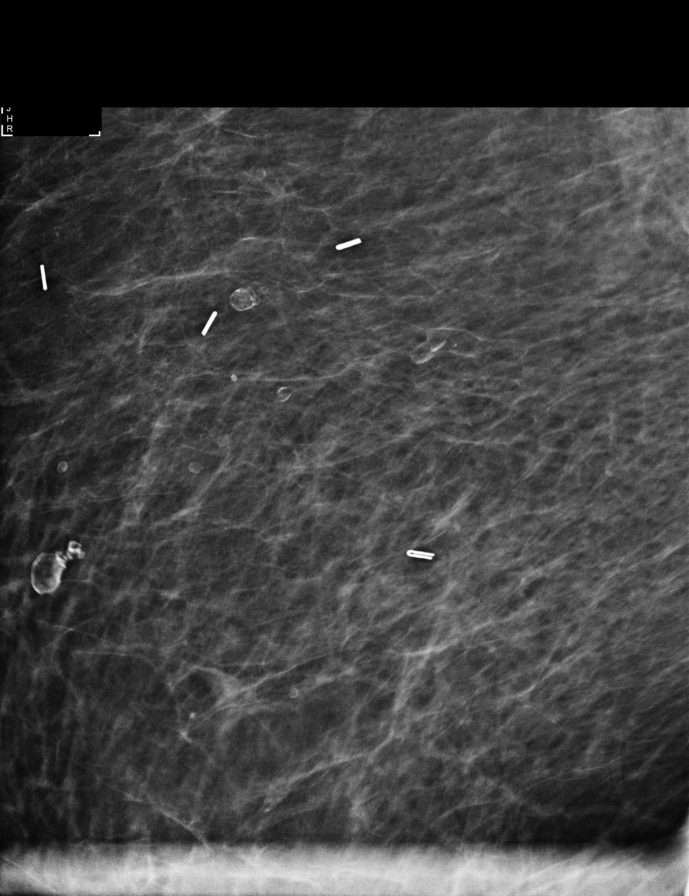

[R MLO (2 of 2)]
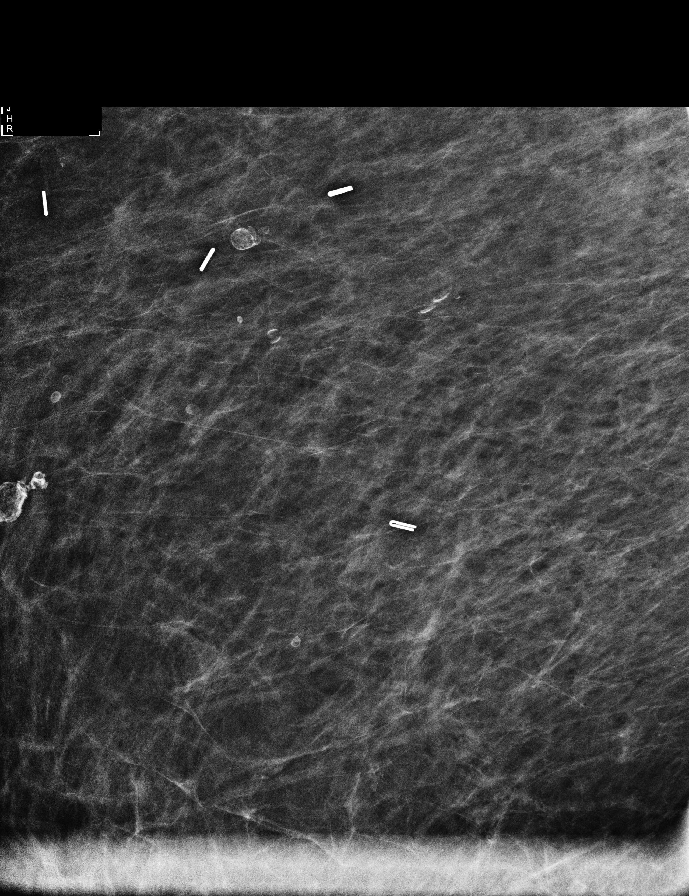

[L CC synth-2D (1 of 2)]
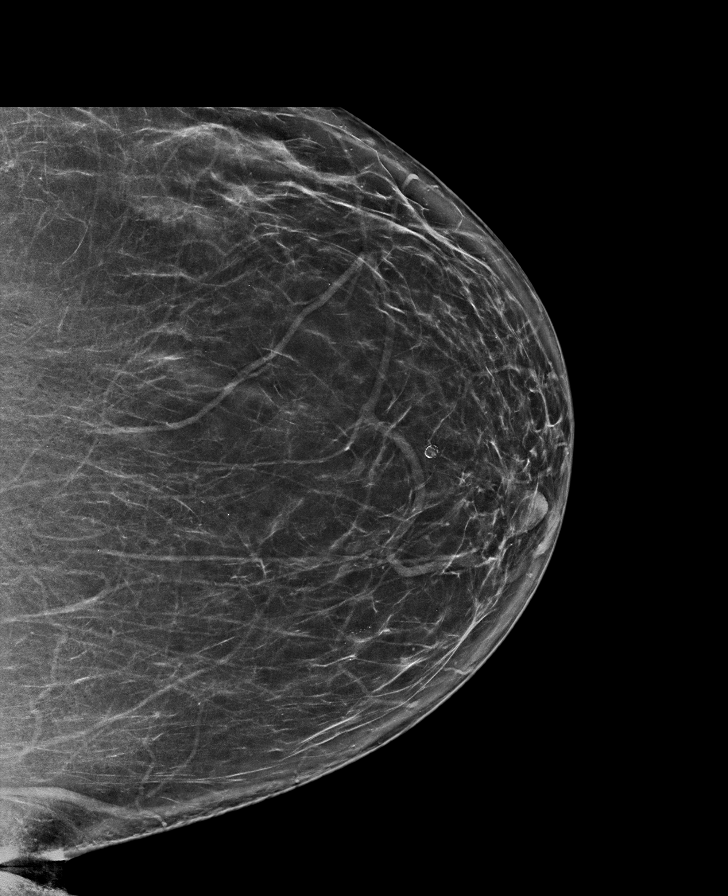

[L MLO synth-2D]
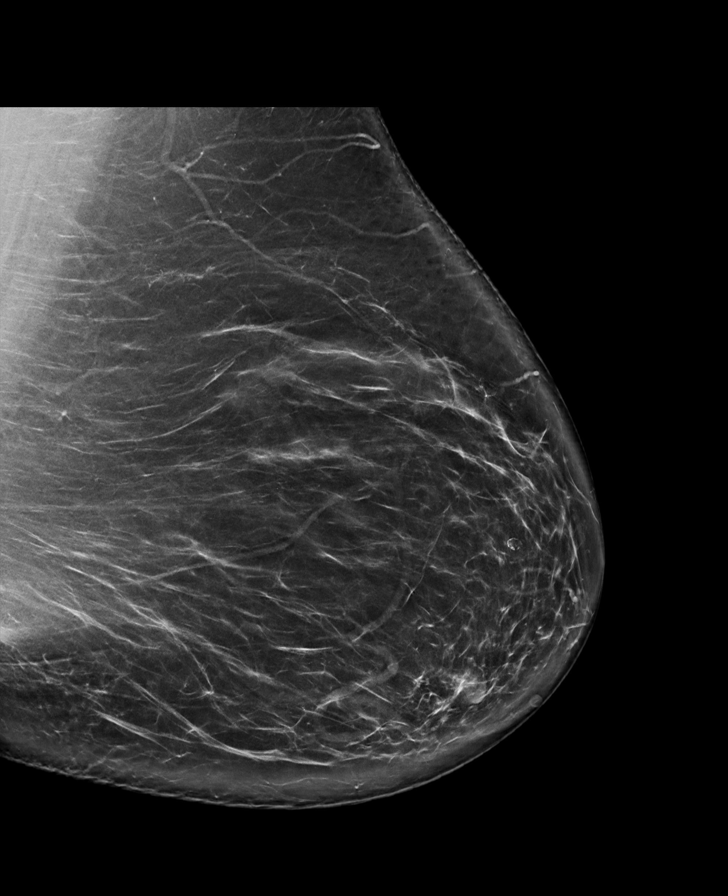

[R CC synth-2D]
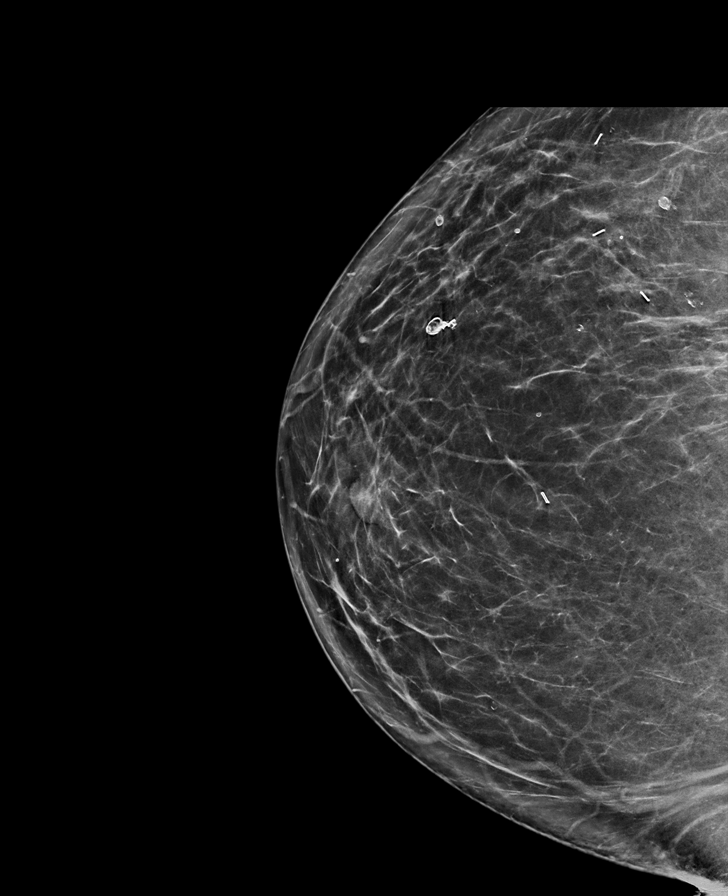

[L CC synth-2D (2 of 2)]
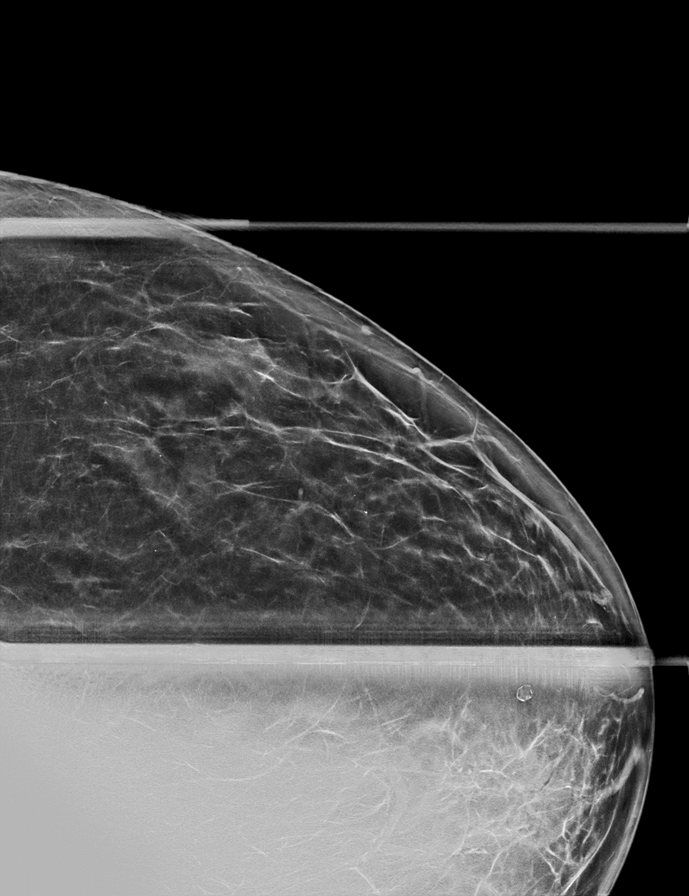

[R MLO synth-2D]
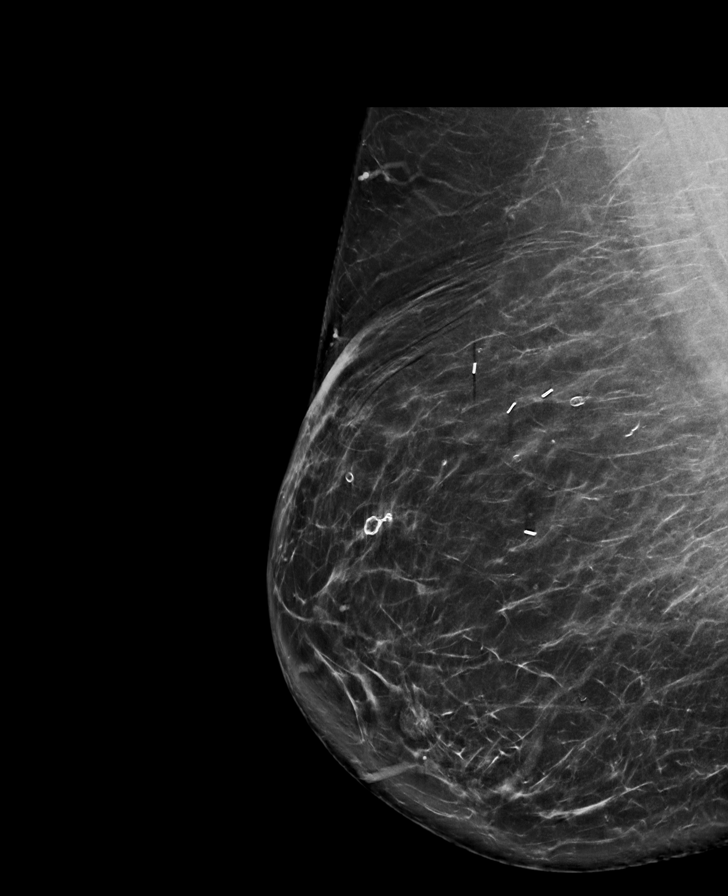

[R CC tomo · tomo slice 41/82.0]
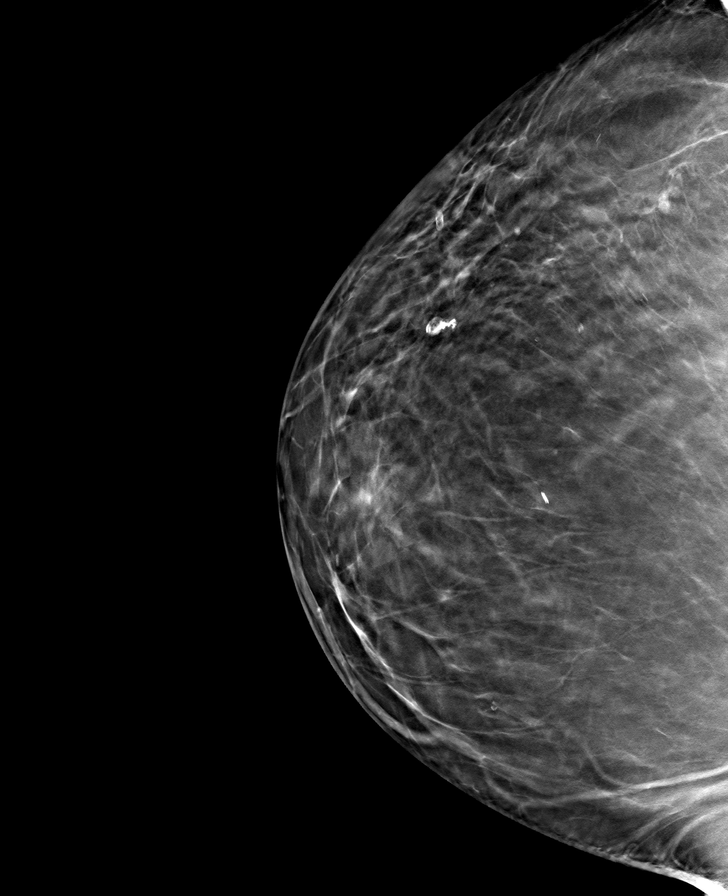

[8 of 32 positions shown; findings below may reference images not displayed]

ACR Breast Density Category b: There are scattered areas of
fibroglandular density.
FINDINGS: No suspicious masses, calcifications, or distortion are seen in
either breast. The right lumpectomy site appears as expected.

Mammographic images were processed with CAD.
IMPRESSION: No mammographic evidence of malignancy. Lumpectomy changes on the
right.

RECOMMENDATION:
Annual diagnostic mammography.

I have discussed the findings and recommendations with the patient.
If applicable, a reminder letter will be sent to the patient
regarding the next appointment.

BI-RADS CATEGORY  2: Benign.

## 2020-06-14 NOTE — Telephone Encounter (Signed)
Patient called in wanting to discuss about motorized chair . Not being able to walk or stand for periods of time.

## 2020-06-14 NOTE — Telephone Encounter (Signed)
Can we make her an appointment? She will need this per her insurance for a "face-to-face" visit  Thank you

## 2020-06-20 ENCOUNTER — Other Ambulatory Visit: Payer: Self-pay | Admitting: Internal Medicine

## 2020-06-20 DIAGNOSIS — J441 Chronic obstructive pulmonary disease with (acute) exacerbation: Secondary | ICD-10-CM

## 2020-06-21 ENCOUNTER — Other Ambulatory Visit: Payer: Self-pay | Admitting: Internal Medicine

## 2020-06-21 ENCOUNTER — Telehealth: Payer: Self-pay | Admitting: *Deleted

## 2020-06-21 DIAGNOSIS — J441 Chronic obstructive pulmonary disease with (acute) exacerbation: Secondary | ICD-10-CM

## 2020-06-21 MED ORDER — VENTOLIN HFA 108 (90 BASE) MCG/ACT IN AERS: 2.0000 | INHALATION_SPRAY | RESPIRATORY_TRACT | 2 refills | Status: DC | PRN

## 2020-06-21 NOTE — Telephone Encounter (Signed)
Sent as Ventolin 2 puffs q4h prn for cough, wheezing, or shortness of breath

## 2020-06-21 NOTE — Telephone Encounter (Signed)
Fax from [facility] - [employer] will not cover Albuterol sulfate; please re-send rx for brand name, Ventolin/Proair. Thanks

## 2020-06-26 ENCOUNTER — Encounter: Payer: Self-pay | Admitting: Physical Medicine and Rehabilitation

## 2020-06-26 ENCOUNTER — Ambulatory Visit: Payer: Self-pay

## 2020-06-26 ENCOUNTER — Ambulatory Visit (INDEPENDENT_AMBULATORY_CARE_PROVIDER_SITE_OTHER): Payer: Medicaid Other | Admitting: Physical Medicine and Rehabilitation

## 2020-06-26 ENCOUNTER — Other Ambulatory Visit: Payer: Self-pay

## 2020-06-26 VITALS — BP 132/79 | HR 89

## 2020-06-26 DIAGNOSIS — M5416 Radiculopathy, lumbar region: Secondary | ICD-10-CM

## 2020-06-26 DIAGNOSIS — M4316 Spondylolisthesis, lumbar region: Secondary | ICD-10-CM

## 2020-06-26 MED ORDER — METHYLPREDNISOLONE ACETATE 80 MG/ML IJ SUSP
40.0000 mg | Freq: Once | INTRAMUSCULAR | Status: AC
  Administered 2020-06-26: 40 mg

## 2020-06-26 NOTE — Progress Notes (Signed)
Pt states pain across the lower back that radiates into the middle of the buttocks that radiates into the back of both thighs. Pt states pain started more than 6 months ago. Standing and walking makes pain worse. Leaning over helps with pain.  .Numeric Pain Rating Scale and Functional Assessment Average Pain 7   In the last MONTH (on 0-10 scale) has pain interfered with the following?  1. General activity like being  able to carry out your everyday physical activities such as walking, climbing stairs, carrying groceries, or moving a chair?  Rating(10)   +Driver, +BT(plavix, stopped 06/19/20) , -Dye Allergies.

## 2020-06-26 NOTE — Progress Notes (Signed)
[name] - 59 y.o. female MRN [id number]  Date of birth: [birth year]  Office Visit Note: Visit Date: 06/26/2020 PCP: Gaylan Gerold, MD Referred by: Earlene Plater, MD  Subjective: Chief Complaint  Patient presents with  . Lower Back - Pain  . Right Thigh - Pain  . Left Thigh - Pain   HPI:  [name] is a 59 y.o. female who comes in today at the request of Dr. Jean Rosenthal for planned midline L5-S1 Lumbar epidural steroid injection with fluoroscopic guidance.  The patient has failed conservative care including home exercise, medications, time and activity modification.  This injection will be diagnostic and hopefully therapeutic.  Please see requesting physician notes for further details and justification.  MRI reviewed with images and spine model.  MRI reviewed in the note below.  Consider facet joint block at L4-5/L5-S1 if no relief or continued back pain.  ROS Otherwise per HPI.  Assessment & Plan: Visit Diagnoses:  1. Lumbar radiculopathy   2. Spondylolisthesis of lumbar region     Plan: No additional findings.   Meds & Orders:  Meds ordered this encounter  Medications  . methylPREDNISolone acetate (DEPO-MEDROL) injection 40 mg    Orders Placed This Encounter  Procedures  . XR C-ARM NO REPORT  . Epidural Steroid injection    Follow-up: Return for Jean Rosenthal, MD as scheduled.   Procedures: No procedures performed  Lumbar Epidural Steroid Injection - Interlaminar Approach with Fluoroscopic Guidance  Patient: [name]      Date of Birth: 05-Sep-1961 MRN: [id number] PCP: Gaylan Gerold, MD      Visit Date: 06/26/2020   Universal Protocol:     Consent Given By: the patient  Position: PRONE  Additional Comments: Vital signs were monitored before and after the procedure. Patient was prepped and draped in the usual sterile fashion. The correct patient, procedure, and site was verified.   Injection Procedure  Details:  Procedure Site One Meds Administered:  Meds ordered this encounter  Medications  . methylPREDNISolone acetate (DEPO-MEDROL) injection 40 mg     Laterality: Right  Location/Site:  L5-S1  Needle size: 20 G  Needle type: Tuohy  Needle Placement: Paramedian epidural  Findings:   -Comments: Excellent flow of contrast into the epidural space.  Procedure Details: Using a paramedian approach from the side mentioned above, the region overlying the inferior lamina was localized under fluoroscopic visualization and the soft tissues overlying this structure were infiltrated with 4 ml. of 1% Lidocaine without Epinephrine. The Tuohy needle was inserted into the epidural space using a paramedian approach.   The epidural space was localized using loss of resistance along with lateral and bi-planar fluoroscopic views.  After negative aspirate for air, blood, and CSF, a 2 ml. volume of Isovue-250 was injected into the epidural space and the flow of contrast was observed. Radiographs were obtained for documentation purposes.    The injectate was administered into the level noted above.   Additional Comments:  The patient tolerated the procedure well Dressing: 2 x 2 sterile gauze and Band-Aid    Post-procedure details: Patient was observed during the procedure. Post-procedure instructions were reviewed.  Patient left the clinic in stable condition.    Clinical History: CLINICAL DATA: 59 year old female with central mid to low back pain  radiating to the sacrum for months to years. Numbness. No prior  surgery.    EXAM:  MRI LUMBAR SPINE WITHOUT CONTRAST    TECHNIQUE:  Multiplanar, multisequence MR imaging of  the lumbar spine was  performed. No intravenous contrast was administered.    COMPARISON: Lumbar radiographs 04/11/2020. CT Abdomen and Pelvis  01/12/2018.    FINDINGS:  Segmentation: Normal on the comparisons.    Alignment: Grade 1 anterolisthesis at both  L4-L5 and L5-S1 was more  apparent on the weight-bearing lumbar radiographs. Lumbar lordosis  otherwise appears stable since 2019.    Vertebrae: No pars fracture identified. No marrow edema or evidence  of acute osseous abnormality. Chronic degenerative endplate marrow  signal changes T12-L1 and L1-L2. Normal background bone marrow  signal. Intact visible sacrum and SI joints.    Conus medullaris and cauda equina: Conus extends to the L1 level. C  T12-L1 level details below. No lower spinal cord or conus signal  abnormality.    Paraspinal and other soft tissues: Negative.    Disc levels:    T11-T12. Mild disc bulging and facet hypertrophy. No stenosis.    T12-L1: Severe chronic disc degeneration with chronic vacuum disc.  Circumferential disc osteophyte complex and mild posterior element  hypertrophy. Mild spinal stenosis at the level of the conus (series  3, image 8 and series 6, image 5). No conus mass effect. Mild to  moderate left T12 foraminal stenosis.    L1-L2: Minor disc bulge. No stenosis.    L2-L3: Negative.    L3-L4: Mild disc desiccation. Mild facet and ligament flavum  hypertrophy. No stenosis.    L4-L5: Circumferential but mostly far lateral disc bulging. Moderate  facet hypertrophy. Vacuum facet here in 2019. No spinal or lateral  recess stenosis. Mild to moderate left and borderline to mild right  L4 foraminal stenosis.    L5-S1: Minor disc bulge. Severe facet hypertrophy. Vacuum facet here  in 2019. No spinal or lateral recess stenosis. Mild to moderate  bilateral L5 foraminal stenosis.    IMPRESSION:  1. No acute osseous abnormality in the lumbar spine. But grade 1  spondylolisthesis at L4-L5 and L5-S1 as seen on the weight-bearing  radiographs this year is associated with severe chronic facet  arthropathy. No spinal stenosis, but mild to moderate L4 and L5  foraminal stenosis.    2. 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 No conus mass effect. Mild  to moderate left T12 neural foraminal stenosis.    3. Mild for age lumbar spine degeneration elsewhere.      Electronically Signed  By: Genevie Ann M.D.  On: 05/23/2020 09:37     Objective:  VS:  HT:    WT:   BMI:     BP:132/79  HR:89bpm  TEMP: ( )  RESP:  Physical Exam Constitutional:      General: She is not in acute distress.    Appearance: Normal appearance. She is obese. She is not ill-appearing.  HENT:     Head: Normocephalic and atraumatic.     Right Ear: External ear normal.     Left Ear: External ear normal.  Eyes:     Extraocular Movements: Extraocular movements intact.  Cardiovascular:     Rate and Rhythm: Normal rate.     Pulses: Normal pulses.  Musculoskeletal:     Right lower leg: No edema.     Left lower leg: No edema.     Comments: Patient has good distal strength with no pain over the greater trochanters.  No clonus or focal weakness.   Skin:    Findings: No erythema, lesion or rash.  Neurological:     General: No focal deficit present.     Mental Status: She is alert and oriented to person, place, and time.     Sensory: No sensory deficit.     Motor: No weakness or abnormal muscle tone.     Coordination: Coordination normal.  Psychiatric:        Mood and Affect: Mood normal.        Behavior: Behavior normal.      Imaging: No results found.

## 2020-06-27 ENCOUNTER — Ambulatory Visit: Payer: Medicaid Other | Admitting: Orthopaedic Surgery

## 2020-06-28 DIAGNOSIS — Z419 Encounter for procedure for purposes other than remedying health state, unspecified: Secondary | ICD-10-CM | POA: Diagnosis not present

## 2020-06-28 NOTE — Procedures (Signed)
Lumbar Epidural Steroid Injection - Interlaminar Approach with Fluoroscopic Guidance  Patient: [name]      Date of Birth: 1961-04-21 MRN: [id number] PCP: Gaylan Gerold, MD      Visit Date: 06/26/2020   Universal Protocol:     Consent Given By: the patient  Position: PRONE  Additional Comments: Vital signs were monitored before and after the procedure. Patient was prepped and draped in the usual sterile fashion. The correct patient, procedure, and site was verified.   Injection Procedure Details:  Procedure Site One Meds Administered:  Meds ordered this encounter  Medications  . methylPREDNISolone acetate (DEPO-MEDROL) injection 40 mg     Laterality: Right  Location/Site:  L5-S1  Needle size: 20 G  Needle type: Tuohy  Needle Placement: Paramedian epidural  Findings:   -Comments: Excellent flow of contrast into the epidural space.  Procedure Details: Using a paramedian approach from the side mentioned above, the region overlying the inferior lamina was localized under fluoroscopic visualization and the soft tissues overlying this structure were infiltrated with 4 ml. of 1% Lidocaine without Epinephrine. The Tuohy needle was inserted into the epidural space using a paramedian approach.   The epidural space was localized using loss of resistance along with lateral and bi-planar fluoroscopic views.  After negative aspirate for air, blood, and CSF, a 2 ml. volume of Isovue-250 was injected into the epidural space and the flow of contrast was observed. Radiographs were obtained for documentation purposes.    The injectate was administered into the level noted above.   Additional Comments:  The patient tolerated the procedure well Dressing: 2 x 2 sterile gauze and Band-Aid    Post-procedure details: Patient was observed during the procedure. Post-procedure instructions were reviewed.  Patient left the clinic in stable condition.

## 2020-07-05 ENCOUNTER — Other Ambulatory Visit: Payer: Self-pay | Admitting: Internal Medicine

## 2020-07-05 DIAGNOSIS — E119 Type 2 diabetes mellitus without complications: Secondary | ICD-10-CM

## 2020-07-10 ENCOUNTER — Encounter (HOSPITAL_COMMUNITY): Payer: Self-pay | Admitting: Emergency Medicine

## 2020-07-10 ENCOUNTER — Emergency Department (HOSPITAL_COMMUNITY)
Admission: EM | Admit: 2020-07-10 | Discharge: 2020-07-10 | Disposition: A | Discharge status: Home / Self Care | Payer: Medicaid Other | Attending: Emergency Medicine | Admitting: Emergency Medicine

## 2020-07-10 ENCOUNTER — Other Ambulatory Visit: Payer: Self-pay

## 2020-07-10 DIAGNOSIS — Z7982 Long term (current) use of aspirin: Secondary | ICD-10-CM | POA: Diagnosis not present

## 2020-07-10 DIAGNOSIS — I5032 Chronic diastolic (congestive) heart failure: Secondary | ICD-10-CM | POA: Insufficient documentation

## 2020-07-10 DIAGNOSIS — Z7902 Long term (current) use of antithrombotics/antiplatelets: Secondary | ICD-10-CM | POA: Insufficient documentation

## 2020-07-10 DIAGNOSIS — I110 Hypertensive heart disease with heart failure: Secondary | ICD-10-CM | POA: Insufficient documentation

## 2020-07-10 DIAGNOSIS — Z79899 Other long term (current) drug therapy: Secondary | ICD-10-CM | POA: Diagnosis not present

## 2020-07-10 DIAGNOSIS — Y9389 Activity, other specified: Secondary | ICD-10-CM | POA: Diagnosis not present

## 2020-07-10 DIAGNOSIS — Y929 Unspecified place or not applicable: Secondary | ICD-10-CM | POA: Insufficient documentation

## 2020-07-10 DIAGNOSIS — T22112A Burn of first degree of left forearm, initial encounter: Secondary | ICD-10-CM | POA: Insufficient documentation

## 2020-07-10 DIAGNOSIS — T300 Burn of unspecified body region, unspecified degree: Secondary | ICD-10-CM

## 2020-07-10 DIAGNOSIS — T2111XA Burn of first degree of chest wall, initial encounter: Secondary | ICD-10-CM | POA: Diagnosis not present

## 2020-07-10 DIAGNOSIS — Z87891 Personal history of nicotine dependence: Secondary | ICD-10-CM | POA: Insufficient documentation

## 2020-07-10 DIAGNOSIS — T310 Burns involving less than 10% of body surface: Secondary | ICD-10-CM | POA: Diagnosis not present

## 2020-07-10 DIAGNOSIS — X131XXA Other contact with steam and other hot vapors, initial encounter: Secondary | ICD-10-CM | POA: Insufficient documentation

## 2020-07-10 DIAGNOSIS — Y999 Unspecified external cause status: Secondary | ICD-10-CM | POA: Insufficient documentation

## 2020-07-10 MED ORDER — HYDROCODONE-ACETAMINOPHEN 5-325 MG PO TABS: 1.0000 | ORAL_TABLET | Freq: Four times a day (QID) | ORAL | 0 refills | Status: DC | PRN

## 2020-07-10 MED ORDER — HYDROCODONE-ACETAMINOPHEN 5-325 MG PO TABS
1.0000 | ORAL_TABLET | Freq: Once | ORAL | Status: AC
  Administered 2020-07-10: 1 via ORAL
  Filled 2020-07-10: qty 1

## 2020-07-10 MED ORDER — SILVER SULFADIAZINE 1 % EX CREA
TOPICAL_CREAM | Freq: Once | CUTANEOUS | Status: AC
  Filled 2020-07-10: qty 85

## 2020-07-10 NOTE — ED Notes (Signed)
Patient verbalizes understanding of discharge instructions. Opportunity for questioning and answers were provided. Armband removed by staff, pt discharged from ED.  

## 2020-07-10 NOTE — ED Triage Notes (Addendum)
Patient reports hot french fries accidentally splattered at her this evening , presents with skin redness at left upper shoulder and left upper arm , no visible blisters noted at triage . Respirations unlabored . Hypertensive at triage.

## 2020-07-10 NOTE — ED Provider Notes (Signed)
[facility] EMERGENCY DEPARTMENT Provider Note   CSN: [id number] Arrival date & time: 07/10/20  0011     History No chief complaint on file.   [name] is a 59 y.o. female with a past medical history of COPD, diabetes, hypertension, eczema presenting to the ED with a chief complaint of burn.  States that yesterday she was opening up a bag of fries when the steam from the fries burned her skin.  Reports pain to the left upper forearm and an area on the left upper chest.  States that she put ice on the area and then presented to the ER.  She has not taken anything for pain.  She denies any trouble breathing, trouble swallowing, facial swelling or fever.  HPI     Past Medical History:  Diagnosis Date  . Anemia   . Aortic valve stenosis, severe   . 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 Asthma   . Blood transfusion    2013[facility]  . Breast cancer in female [facility])    Right  . CAD (coronary artery disease)    Cath 2010 with DES x 1 RCA-- PT'S CARDIOLOGIST IS DR. Sutton  . Chronic diastolic congestive heart failure ([place])   . COPD (chronic obstructive pulmonary disease) ([place])   . Depression   . Diabetes mellitus DIAGNOSED IN2010   Controll s with diet  . Eczema    on back  . Hyperlipidemia   . Hypertension   . Kidney stones   . Morbid obesity with body mass index of 60.0-69.9 in adult [facility])   . Myocardial infarction ([place])    PT THINKS SHE WAS DX WITH MI AT THE TIME OF HEART STENTING  . Pulmonary embolism ([place]) 02/08/12   S/P RT TOTAL KNEE ON 02/03/12--ON 02/08/12--DEVELOPED ACUTE SOB AND CHEST PAIN--AND DIAGNOSED WITH  PULMONARY EMBOLUS AND PNEUMONIA  . Restless leg syndrome   . Sleep apnea    uses 3 liters O2 at night   . Uterine fibroid    NO PROBLEMS AT PRESENT FROM THE FIBROIDS-STATES SHE IS POST MENOPAUSAL-LAST MENSES 2010 EXCEPT FOR  EPISODE THIS YR OF BLEEDING RELATED TO FIBROIDS.  Marland Kitchen Weakness    BOTH HANDS - S/P BILATERAL CARPAL TUNNEL RELEASE--BUT STILL HAS WEAKNESS--OFTEN DROPS THINGS    Patient Active Problem List   Diagnosis Date Noted  . Tick bite 04/06/2020  . Stiffness of hand joint 06/09/2019  . Right arm numbness 06/09/2019  . Low back pain 05/02/2019  . Pain of left hip joint 08/09/2018  . Malignant neoplasm of upper-outer quadrant of right breast in female, estrogen receptor positive ([place]) 06/10/2018  . Screening for colon cancer 03/12/2018  . Screening for breast cancer 03/12/2018  . Restless leg syndrome 02/04/2018  . 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 Depression 08/06/2015  . 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 Hypertension associated with diabetes ([place])   . Aortic stenosis   . Morbid obesity with body mass index of 60.0-69.9 in adult [facility])   . Chronic diastolic congestive heart failure ([place])   . COPD (chronic obstructive pulmonary disease) ([place]) 09/21/2013  . Obstructive sleep apnea 09/21/2013  . Nocturnal hypoxemia 09/12/2013  . Abnormal CT scan, chest 05/12/2013  . Hyperlipidemia 11/30/2012  . History of pulmonary embolus (PE) 07/09/2012  . Long term (  current) use of anticoagulants 02/20/2012  . Normocytic anemia 02/08/2012  . Type 2 diabetes mellitus without complication, without long-term current use of insulin ([place]) 02/08/2012  . Asthma 02/08/2012  . GERD (gastroesophageal reflux disease)   . CAD (coronary artery disease) 12/08/2011    Past Surgical History:  Procedure Laterality Date  . BREAST LUMPECTOMY Right 06/2018  . BREAST LUMPECTOMY WITH RADIOACTIVE SEED AND SENTINEL LYMPH NODE BIOPSY Right 07/19/2018   Procedure: BREAST LUMPECTOMY WITH RADIOACTIVE SEED AND SENTINEL LYMPH NODE BIOPSY;  Surgeon: Alphonsa Overall, MD;  Location: [place];  Service: General;  Laterality: Right;  . CARDIAC CATHETERIZATION    . CARPAL  TUNNEL RELEASE     Bilateral  . CHOLECYSTECTOMY    . CORONARY ANGIOPLASTY     2010 has stent in place  . CYSTOSCOPY W/ RETROGRADES Right 09/21/2013   Procedure: CYSTOSCOPY WITH RIGHT RETROGRADE PYELOGRAM RIGHT DOUBLE J STENT ;  Surgeon: Fredricka Bonine, MD;  Location: [facility];  Service: Urology;  Laterality: Right;  . CYSTOSCOPY WITH URETEROSCOPY AND STENT PLACEMENT Right 10/25/2013   Procedure: CYSTOSCOPY RIGHT URETEROSCOPY HOLMIUM LASER LITHO AND STENT PLACEMENT;  Surgeon: Fredricka Bonine, MD;  Location: [facility];  Service: Urology;  Laterality: Right;  . HERNIA REPAIR    . INTRAOPERATIVE TRANSESOPHAGEAL ECHOCARDIOGRAM N/A 12/12/2014   Procedure: INTRAOPERATIVE TRANSESOPHAGEAL ECHOCARDIOGRAM;  Surgeon: Burnell Blanks, MD;  Location: [facility] OR;  Service: Cardiovascular;  Laterality: N/A;  . JOINT REPLACEMENT     bil total knees  . KNEE ARTHROPLASTY  02/03/2012   Procedure: COMPUTER ASSISTED TOTAL KNEE ARTHROPLASTY;  Surgeon: Mcarthur Rossetti, MD;  Location: [place];  Service: Orthopedics;  Laterality: Right;  Right total knee arthroplasty  . LEFT AND RIGHT HEART CATHETERIZATION WITH CORONARY ANGIOGRAM N/A 03/17/2013   Procedure: LEFT AND RIGHT HEART CATHETERIZATION WITH CORONARY ANGIOGRAM;  Surgeon: Burnell Blanks, MD;  Location: [facility] CATH LAB;  Service: Cardiovascular;  Laterality: N/A;  . LEFT AND RIGHT HEART CATHETERIZATION WITH CORONARY/GRAFT ANGIOGRAM N/A 09/14/2014   Procedure: LEFT AND RIGHT HEART CATHETERIZATION WITH Beatrix Fetters;  Surgeon: Burnell Blanks, MD;  Location: [facility] CATH LAB;  Service: Cardiovascular;  Laterality: N/A;  . TEE WITHOUT CARDIOVERSION N/A 03/14/2013   Procedure: TRANSESOPHAGEAL ECHOCARDIOGRAM (TEE);  Surgeon: Lelon Perla, MD;  Location: [facility] ENDOSCOPY;  Service: Cardiovascular;  Laterality: N/A;  . TEE WITHOUT CARDIOVERSION N/A 11/14/2014   Procedure: TRANSESOPHAGEAL ECHOCARDIOGRAM (TEE);  Surgeon: Lelon Perla, MD;   Location: [facility] ENDOSCOPY;  Service: Cardiovascular;  Laterality: N/A;  . TOTAL KNEE ARTHROPLASTY  09/10/2012   Procedure: TOTAL KNEE ARTHROPLASTY;  Surgeon: Mcarthur Rossetti, MD;  [facility];  Service: Orthopedics;  Laterality: Left;  . TOTAL KNEE REVISION Right 07/15/2013   Procedure: REVISION ARTHROPLASTY RIGHT KNEE;  Surgeon: Mcarthur Rossetti, MD;  [facility];  Service: Orthopedics;  Laterality: Right;  . TRANSCATHETER AORTIC VALVE REPLACEMENT, TRANSFEMORAL N/A 12/12/2014   Procedure: TRANSCATHETER AORTIC VALVE REPLACEMENT, TRANSFEMORAL;  Surgeon: Burnell Blanks, MD;  Location: [place];  Service: Cardiovascular;  Laterality: N/A;  . TRIGGER FINGER RELEASE  09/10/2012   Procedure: RELEASE TRIGGER FINGER/A-1 PULLEY;  Surgeon: Mcarthur Rossetti, MD;  Location[facility];  Service: Orthopedics;  Laterality: Right;  Right Ring Finger  . TUBAL LIGATION       OB History    Gravida  3   Para      Term      Preterm      AB  1  Living        SAB  1   TAB      Ectopic      Multiple      Live Births  2        Obstetric Comments  SVD x 2. SAB x 1.         Family History  Problem Relation Age of Onset  . Breast cancer Mother        stage IV at diagnosis  . Emphysema Mother        smoked  . Heart disease Mother   . COPD Father        smoked  . Asthma Father   . Heart disease Father   . Cancer Brother        Sinus    Social History   Tobacco Use  . Smoking status: Former Smoker    Packs/day: 1.50    Years: 30.00    Pack years: 45.00    Types: Cigarettes    Quit date: 12/29/2000    Years since quitting: 19.5  . Smokeless tobacco: Never Used  Vaping Use  . Vaping Use: Never used  Substance Use Topics  . Alcohol use: Yes    Comment: rarely  . Drug use: No    Home Medications Prior to Admission medications   Medication Sig Start Date End Date Taking? Authorizing Provider  albuterol (PROVENTIL) (2.5 MG/3ML) 0.083% nebulizer  solution Take 3 mLs (2.5 mg total) by nebulization every 4 (four) hours as needed for wheezing or shortness of breath. 07/14/19  Yes Axel Filler, MD  Alirocumab (PRALUENT) 150 MG/ML SOAJ Inject 1 pen into the skin every 14 (fourteen) days. 09/20/19  Yes Burnell Blanks, MD  ASPIRIN ADULT LOW STRENGTH 81 MG EC tablet TAKE 1 Tablet BY MOUTH ONCE DAILY 10/11/18  Yes Lorella Nimrod, MD  atorvastatin (LIPITOR) 80 MG tablet Take 1 tablet (80 mg total) by mouth daily. 02/01/20  Yes Earlene Plater, MD  clopidogrel (PLAVIX) 75 MG tablet Take 1 tablet (75 mg total) by mouth every morning. 01/17/20  Yes Earlene Plater, MD  Cyanocobalamin (VITAMIN B12 SL) Place 1 tablet under the tongue daily.   Yes [provider]  DULoxetine (CYMBALTA) 30 MG capsule Take 1 capsule (30 mg total) by mouth 2 (two) times daily. 11/08/19  Yes Earlene Plater, MD  exemestane (AROMASIN) 25 MG tablet Take 1 tablet (25 mg total) by mouth daily after breakfast. 07/14/19  Yes Magrinat, Virgie Dad, MD  ezetimibe (ZETIA) 10 MG tablet Take 1 tablet (10 mg total) by mouth daily. 03/06/20  Yes Earlene Plater, MD  Fluticasone-Umeclidin-Vilant (TRELEGY ELLIPTA) 100-62.5-25 MCG/INH AEPB Inhale 1 puff into the lungs daily. 04/16/20  Yes Earlene Plater, MD  furosemide (LASIX) 40 MG tablet Take 1 tablet (40 mg total) by mouth 2 (two) times daily. Patient taking differently: Take 20 mg by mouth daily.  01/13/19  Yes Lorella Nimrod, MD  gabapentin (NEURONTIN) 300 MG capsule TAKE 3 CAPSULES BY MOUTH DAILY AT BEDTIME Patient taking differently: Take 300 mg by mouth at bedtime.  07/05/20  Yes Mosetta Anis, MD  hydrochlorothiazide (HYDRODIURIL) 25 MG tablet Take 1 tablet by mouth once daily 05/31/20  Yes Earlene Plater, MD  hydrocortisone cream 1 % Apply 1 application topically daily as needed for itching.   Yes [provider]  ibuprofen (ADVIL,MOTRIN) 200 MG tablet Take 400 mg by mouth every 6 (six) hours as needed  for moderate pain.   Yes [provider]  liraglutide (VICTOZA) 18 MG/3ML SOPN Inject 0.3 mLs (1.8 mg total) into the skin daily. 01/13/19  Yes Lorella Nimrod, MD  losartan (COZAAR) 100 MG tablet Take 1 tablet by mouth once daily 05/31/20  Yes Earlene Plater, MD  Melatonin 5 MG CAPS Take 1 capsule by mouth daily as needed (insomnia).   Yes [provider]  metoprolol tartrate (LOPRESSOR) 25 MG tablet Take 1 tablet (25 mg total) by mouth 2 (two) times daily. 01/17/20  Yes Earlene Plater, MD  nitroGLYCERIN (NITROSTAT) 0.4 MG SL tablet Place 1 tablet (0.4 mg total) under the tongue every 5 (five) minutes as needed for chest pain. 01/13/19  Yes Lorella Nimrod, MD  OXYGEN Inhale 3 L into the lungs as needed.   Yes [provider]  potassium chloride SA (K-DUR,KLOR-CON) 20 MEQ tablet Take 2 tablets (40 mEq total) by mouth daily. Patient taking differently: Take 20 mEq by mouth once.  01/13/19  Yes Lorella Nimrod, MD  TURMERIC PO Take 1 tablet by mouth daily.   Yes [provider]  VITAMIN D PO Take 1 tablet by mouth daily.   Yes [provider]  VITAMIN E PO Take 1 tablet by mouth daily.   Yes [provider]  Accu-Chek FastClix Lancets MISC Check blood sugar two times a day 03/17/19   Lorella Nimrod, MD  acetaminophen-codeine (TYLENOL #3) 300-30 MG tablet Take 1-2 tablets by mouth every 8 (eight) hours as needed for moderate pain. Patient not taking: Reported on 07/10/2020 05/30/20   Mcarthur Rossetti, MD  albuterol (VENTOLIN HFA) 108 (90 Base) MCG/ACT inhaler INHALE 2 PUFFS BY MOUTH EVERY 4 HOURS AS NEEDED FOR COUGH, WHEEZING, OR SHORTNESS OF BREATH Patient taking differently: Inhale 2 puffs into the lungs every 4 (four) hours as needed. INHALE 2 PUFFS BY MOUTH EVERY 4 HOURS AS NEEDED FOR COUGH, WHEEZING, OR SHORTNESS OF BREATH 06/20/20   Oda Kilts, MD  Blood Glucose Monitoring Suppl (ACCU-CHEK GUIDE) w/Device KIT 1 each by Does not apply  route 2 (two) times daily. 03/17/19   Lorella Nimrod, MD  cyclobenzaprine (FLEXERIL) 5 MG tablet Take 1 tablet (5 mg total) by mouth 3 (three) times daily as needed for muscle spasms. Patient not taking: Reported on 07/10/2020 05/16/20   [name], MD  glucose blood (ACCU-CHEK GUIDE) test strip Check blood sugar 2 times per day 03/17/19   Lorella Nimrod, MD  HYDROcodone-acetaminophen (NORCO/VICODIN) 5-325 MG tablet Take 1 tablet by mouth every 6 (six) hours as needed. 07/10/20   Alyjah Lovingood, PA-C  Insulin Pen Needle (PEN NEEDLES) 32G X 4 MM MISC 1.8 mg by Does not apply route daily. 05/31/20   Earlene Plater, MD  VENTOLIN HFA 108 [phone] Base) MCG/ACT inhaler Inhale 2 puffs into the lungs every 4 (four) hours as needed for wheezing or shortness of breath (or cough). Patient not taking: Reported on 07/10/2020 06/21/20   Oda Kilts, MD  Fluticasone-Salmeterol (ADVAIR DISKUS) 100-50 MCG/DOSE AEPB Inhale 1 puff into the lungs daily. 02/20/20 05/09/20  Earlene Plater, MD    Allergies    Patient has no known allergies.  Review of Systems   Review of Systems  Constitutional: Negative for appetite change, chills and fever.  HENT: Negative for ear pain, rhinorrhea, sneezing and sore throat.   Eyes: Negative for photophobia and visual disturbance.  Respiratory: Negative for cough, chest tightness, shortness of breath and wheezing.   Cardiovascular: Negative for chest pain and palpitations.  Gastrointestinal: Negative for abdominal pain,  blood in stool, constipation, diarrhea, nausea and vomiting.  Genitourinary: Negative for dysuria, hematuria and urgency.  Musculoskeletal: Negative for myalgias.  Skin: Positive for wound. Negative for rash.  Neurological: Negative for dizziness, weakness and light-headedness.    Physical Exam Updated Vital Signs BP (!) 164/94 (BP Location: Left Arm)   Pulse 91   Temp 98.3 F (36.8 C) (Oral)   Resp 16   Ht 5' (1.524 m)   Wt (!) 151 kg   LMP 02/03/2012    SpO2 97%   BMI 65.01 kg/m   Physical Exam Vitals and nursing note reviewed.  Constitutional:      General: She is not in acute distress.    Appearance: She is well-developed.  HENT:     Head: Normocephalic and atraumatic.     Nose: Nose normal.  Eyes:     General: No scleral icterus.       Left eye: No discharge.     Conjunctiva/sclera: Conjunctivae normal.  Cardiovascular:     Rate and Rhythm: Normal rate and regular rhythm.     Heart sounds: Normal heart sounds. No murmur heard.  No friction rub. No gallop.   Pulmonary:     Effort: Pulmonary effort is normal. No respiratory distress.     Breath sounds: Normal breath sounds.  Abdominal:     General: Bowel sounds are normal. There is no distension.     Palpations: Abdomen is soft.     Tenderness: There is no abdominal tenderness. There is no guarding.  Musculoskeletal:        General: Normal range of motion.       Arms:     Cervical back: Normal range of motion and neck supple.     Comments: 3cm circular area of erythema on the left upper chest as noted in the image.  There is some erythema noted of the left upper forearm.  Compartments are soft.  2+ radial pulses noted bilaterally.  No blisters noted. Areas are tender to palpation.  Skin:    General: Skin is warm and dry.     Findings: No rash.  Neurological:     Mental Status: She is alert.     Motor: No abnormal muscle tone.     Coordination: Coordination normal.     ED Results / Procedures / Treatments   Labs (all labs ordered are listed, but only abnormal results are displayed) Labs Reviewed - No data to display  EKG None  Radiology No results found.  Procedures Procedures (including critical care time)  Medications Ordered in ED Medications  HYDROcodone-acetaminophen (NORCO/VICODIN) 5-325 MG per tablet 1 tablet (1 tablet Oral Given 07/10/20 0915)  silver sulfADIAZINE (SILVADENE) 1 % cream ( Topical Given 07/10/20 0916)    ED Course  I have reviewed  the triage vital signs and the nursing notes.  Pertinent labs & imaging results that were available during my care of the patient were reviewed by me and considered in my medical decision making (see chart for details).    MDM Rules/Calculators/A&P                          59 year old female presenting to the ED with a chief complaint of burn.  States that she was opening up a hot bag of [place] fries when the steam burned her skin.  Reports superficial but painful burn to her left upper chest and left upper forearm. Erythema noted on exam without blisters or  pustules.  Compartments are soft.  Normal distal pulses noted.  No signs of respiratory distress or airway compromise.  No circumferential burns noted.  Will treat with Silvadene cream and pain medication.  Advised to follow-up with PCP and return for worsening symptoms.   Patient is hemodynamically stable, in NAD, and able to ambulate in the ED. Evaluation does not show pathology that would require ongoing emergent intervention or inpatient treatment. I explained the diagnosis to the patient. Pain has been managed and has no complaints prior to discharge. Patient is comfortable with above plan and is stable for discharge at this time. All questions were answered prior to disposition. Strict return precautions for returning to the ED were discussed. Encouraged follow up with PCP.   Prior to providing a prescription for a controlled substance, I independently reviewed the patient's recent prescription history on the [place]. The patient had no recent or regular prescriptions and was deemed appropriate for a brief, less than 3 day prescription of narcotic for acute analgesia.  An After Visit Summary was printed and given to the patient.   Portions of this note were generated with [employer]. Dictation errors may occur despite best attempts at proofreading.  Final Clinical Impression(s)  / ED Diagnoses Final diagnoses:  Superficial burn    Rx / DC Orders ED Discharge Orders         Ordered    HYDROcodone-acetaminophen (NORCO/VICODIN) 5-325 MG tablet  Every 6 hours PRN     Discontinue  Reprint     07/10/20 0841           Delia Heady, PA-C 07/10/20 2707    Isla Pence, MD 07/10/20 1150

## 2020-07-10 NOTE — Discharge Instructions (Signed)
Use the Silvadene cream as directed. Take the pain medication as needed. Return to the ER for worsening pain, redness, swelling, fever or trouble breathing.

## 2020-07-11 ENCOUNTER — Telehealth: Payer: Self-pay

## 2020-07-11 NOTE — Telephone Encounter (Signed)
Rep called in to confirm upcoming appt

## 2020-07-15 NOTE — Progress Notes (Signed)
[place]  [phone] [phone]   ID: [name] DOB: 1961-07-26  MR#: [id number]  TMA#:[id number]  Patient Care Team: Gaylan Gerold, DO as PCP - General Burnell Blanks, MD as PCP - Cardiology (Cardiology) Alphonsa Overall, MD as Consulting Physician (General Surgery) Yolander Goodie, Virgie Dad, MD as Consulting Physician (Oncology) Kyung Rudd, MD as Consulting Physician (Radiation Oncology) Burnell Blanks, MD as Consulting Physician (Cardiology) Delice Bison, Charlestine Massed, NP as Nurse Practitioner (Hematology and Oncology) OTHER MD:   CHIEF COMPLAINT: Estrogen receptor positive breast cancer  CURRENT TREATMENT: Exemestane   INTERVAL HISTORY: [name] returns today for follow-up and treatment of her estrogen receptor positive breast cancer.   She continues on exemestane.  She tolerates this generally well.  She has occasional hot flashes which she does not think are any significant problem.  She has some vaginal dryness.  That is more of a concern.  Since her last visit, she underwent bilateral diagnostic mammography with tomography at [facility] on 06/14/2020 showing: breast density category B; no evidence of malignancy in either breast.   REVIEW OF SYSTEMS: [name] has a little bit of itching in the lateral aspect of the right breast at times.  She wonders if her bra might be bothering her.  She had the Pfizer vaccines and tolerated them well.  She is not able to exercise because of back pain.  She did have an epidural which helped minimally.  She is thinking of getting a motorized wheelchair.  She tells me family is doing fine.  A detailed review of systems today was otherwise stable   HISTORY OF CURRENT ILLNESS: On the original intake note:  [name] had routine screening mammography on 05/27/2018 showing a possible abnormality in the right breast. She underwent unilateral right  diagnostic mammography  with tomography and right breast ultrasonography at [facility] on 06/02/2018 showing: breast density category B. An area of distortion is identified in the upper outer quadrant of the right breast mammographically. Ultrasonography of this area found an irregular hypoechoic mass at the 11 o'clock upper outer quadrant measuring 1.8 x 1.8 x 1.4 cm and located 6 cm from the nipple. The right axilla is negative sonographically for abnormal lymph nodes.   Accordingly on 06/04/2018 she proceeded to biopsy of the right breast area in question. The pathology from this procedure showed ([id number]): Invasive ductal carcinoma grade 1 measuring 1.0 cm in greatest extent, with microcalcifications. Ductal carcinoma in situ high grade. Prognostic indicators significant for: estrogen receptor, 100% positive and progesterone receptor, 100% positive, both with strong staining intensity. Proliferation marker Ki67 at 8%. HER2  not amplified with ratios HER2/CEP17 signals 1.32 and average HER2 copies per cell 1.85  The patient's subsequent history is as detailed below.   PAST MEDICAL HISTORY: Past Medical History:  Diagnosis Date  . Anemia   . Aortic valve stenosis, severe   . 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 Asthma   . Blood transfusion    2013[facility]  . Breast cancer in female [facility])    Right  . CAD (coronary artery disease)    Cath 2010 with DES x 1 RCA-- PT'S CARDIOLOGIST IS DR. Foreman  . Chronic diastolic congestive heart failure ([place])   . COPD (chronic obstructive pulmonary disease) ([place])   . Depression   . Diabetes mellitus  DIAGNOSED IN2010   Controll s with diet  . Eczema    on back  . Hyperlipidemia   . Hypertension   . Kidney stones   . Morbid obesity with body mass index of 60.0-69.9 in adult [facility])   . Myocardial infarction ([place])    PT THINKS SHE WAS DX WITH MI AT THE TIME OF  HEART STENTING  . Pulmonary embolism ([place]) 02/08/12   S/P RT TOTAL KNEE ON 02/03/12--ON 02/08/12--DEVELOPED ACUTE SOB AND CHEST PAIN--AND DIAGNOSED WITH  PULMONARY EMBOLUS AND PNEUMONIA  . Restless leg syndrome   . Sleep apnea    uses 3 liters O2 at night   . Uterine fibroid    NO PROBLEMS AT PRESENT FROM THE FIBROIDS-STATES SHE IS POST MENOPAUSAL-LAST MENSES 2010 EXCEPT FOR EPISODE THIS YR OF BLEEDING RELATED TO FIBROIDS.  Marland Kitchen Weakness    BOTH HANDS - S/P BILATERAL CARPAL TUNNEL RELEASE--BUT STILL HAS WEAKNESS--OFTEN DROPS THINGS  Migraines at younger age. MI in 2009   PAST SURGICAL HISTORY: Past Surgical History:  Procedure Laterality Date  . BREAST LUMPECTOMY Right 06/2018  . BREAST LUMPECTOMY WITH RADIOACTIVE SEED AND SENTINEL LYMPH NODE BIOPSY Right 07/19/2018   Procedure: BREAST LUMPECTOMY WITH RADIOACTIVE SEED AND SENTINEL LYMPH NODE BIOPSY;  Surgeon: Alphonsa Overall, MD;  Location: [facility];  Service: General;  Laterality: Right;  . CARDIAC CATHETERIZATION    . CARPAL TUNNEL RELEASE     Bilateral  . CHOLECYSTECTOMY    . CORONARY ANGIOPLASTY     2010 has stent in place  . CYSTOSCOPY W/ RETROGRADES Right 09/21/2013   Procedure: CYSTOSCOPY WITH RIGHT RETROGRADE PYELOGRAM RIGHT DOUBLE J STENT ;  Surgeon: Fredricka Bonine, MD;  Location[facility];  Service: Urology;  Laterality: Right;  . CYSTOSCOPY WITH URETEROSCOPY AND STENT PLACEMENT Right 10/25/2013   Procedure: CYSTOSCOPY RIGHT URETEROSCOPY HOLMIUM LASER LITHO AND STENT PLACEMENT;  Surgeon: Fredricka Bonine, MD;  [facility]: [facility];  Service: Urology;  Laterality: Right;  . HERNIA REPAIR    . INTRAOPERATIVE TRANSESOPHAGEAL ECHOCARDIOGRAM N/A 12/12/2014   Procedure: INTRAOPERATIVE TRANSESOPHAGEAL ECHOCARDIOGRAM;  Surgeon: Burnell Blanks, MD;  Location: [facility] OR;  Service: Cardiovascular;  Laterality: N/A;  . JOINT REPLACEMENT     bil total knees  . KNEE ARTHROPLASTY  02/03/2012   Procedure: COMPUTER ASSISTED TOTAL KNEE  ARTHROPLASTY;  Surgeon: Mcarthur Rossetti, MD;  Location: [facility];  Service: Orthopedics;  Laterality: Right;  Right total knee arthroplasty  . LEFT AND RIGHT HEART CATHETERIZATION WITH CORONARY ANGIOGRAM N/A 03/17/2013   Procedure: LEFT AND RIGHT HEART CATHETERIZATION WITH CORONARY ANGIOGRAM;  Surgeon: Burnell Blanks, MD;  Location: [facility] CATH LAB;  Service: Cardiovascular;  Laterality: N/A;  . LEFT AND RIGHT HEART CATHETERIZATION WITH CORONARY/GRAFT ANGIOGRAM N/A 09/14/2014   Procedure: LEFT AND RIGHT HEART CATHETERIZATION WITH Beatrix Fetters;  Surgeon: Burnell Blanks, MD;  Location: [facility] CATH LAB;  Service: Cardiovascular;  Laterality: N/A;  . TEE WITHOUT CARDIOVERSION N/A 03/14/2013   Procedure: TRANSESOPHAGEAL ECHOCARDIOGRAM (TEE);  Surgeon: Lelon Perla, MD;  Location: [facility] ENDOSCOPY;  Service: Cardiovascular;  Laterality: N/A;  . TEE WITHOUT CARDIOVERSION N/A 11/14/2014   Procedure: TRANSESOPHAGEAL ECHOCARDIOGRAM (TEE);  Surgeon: Lelon Perla, MD;  Location: [facility] ENDOSCOPY;  Service: Cardiovascular;  Laterality: N/A;  . TOTAL KNEE ARTHROPLASTY  09/10/2012   Procedure: TOTAL KNEE ARTHROPLASTY;  Surgeon: Mcarthur Rossetti, MD;  [facility];  Service: Orthopedics;  Laterality: Left;  . TOTAL KNEE REVISION Right 07/15/2013   Procedure: REVISION ARTHROPLASTY RIGHT KNEE;  Surgeon: Mcarthur Rossetti, MD;  [facility];  Service: Orthopedics;  Laterality: Right;  . TRANSCATHETER AORTIC VALVE REPLACEMENT, TRANSFEMORAL N/A 12/12/2014   Procedure: TRANSCATHETER AORTIC VALVE REPLACEMENT, TRANSFEMORAL;  Surgeon: Burnell Blanks, MD;  Location: [facility];  Service: Cardiovascular;  Laterality: N/A;  . TRIGGER FINGER RELEASE  09/10/2012   Procedure: RELEASE TRIGGER FINGER/A-1 PULLEY;  Surgeon: Mcarthur Rossetti, MD;  [facility];  Service: Orthopedics;  Laterality: Right;  Right Ring Finger  . TUBAL LIGATION    [place]ov[place] Mitral Aortic valve surgery  2015   FAMILY HISTORY Family History  Problem Relation Age of Onset  . Breast cancer Mother        stage IV at diagnosis  . Emphysema Mother        smoked  . Heart disease Mother   . COPD Father        smoked  . Asthma Father   . Heart disease Father   . Cancer Brother        Sinus  The patient's father died at age 73 due to emphysema and possibly cancer . The patient's mother died at age 42 due to breast cancer with stage IV disease at presentation. The patient has 2 full and 1 half brothers and 1 sister.  One of the patient's brothers had nasopharyngeal cancer stage IV diagnosed at age 20. There was also a paternal great grandfather with prostate cancer. The patient denies a family history of ovarian cancer.    GYNECOLOGIC HISTORY:  Patient's last menstrual period was 02/03/2012. Menarche: 59 years old Age at first live birth: 59 years old She is [place] P2. Her LMP was around age 73. She used oral contraception in her 16'X with no complications.    SOCIAL HISTORY:  [name] is disabled. Her husband, [name] is retired from working in Health and safety inspector from [employer], Estate agent, and smoke damage. The patient's son, [name] 31 lives in [place] and works as a Biomedical scientist. The patient's son [name] 30 lives in [place] and is a Environmental education officer of a [employer]. The patient has no grandchildren. She attends [employer].     ADVANCED DIRECTIVES: not in place   HEALTH MAINTENANCE: Social History   Tobacco Use  . Smoking status: Former Smoker    Packs/day: 1.50    Years: 30.00    Pack years: 45.00    Types: Cigarettes    Quit date: 12/29/2000    Years since quitting: 19.5  . Smokeless tobacco: Never Used  Vaping Use  . Vaping Use: Never used  Substance Use Topics  . Alcohol use: Yes    Comment: rarely  . Drug use: No     Colonoscopy: about 8 year ago  PAP: 10/20/2016 normal   Bone density: no   No Known Allergies  Current Outpatient Medications  Medication  Sig Dispense Refill  . Accu-Chek FastClix Lancets MISC Check blood sugar two times a day 102 each 9  . acetaminophen-codeine (TYLENOL #3) 300-30 MG tablet Take 1-2 tablets by mouth every 8 (eight) hours as needed for moderate pain. (Patient not taking: Reported on 07/10/2020) 30 tablet 0  . albuterol (PROVENTIL) (2.5 MG/3ML) 0.083% nebulizer solution Take 3 mLs (2.5 mg total) by nebulization every 4 (four) hours as needed for wheezing or shortness of breath. 180 mL 1  . albuterol (VENTOLIN HFA) 108 (90 Base) MCG/ACT inhaler INHALE 2 PUFFS BY MOUTH EVERY 4 HOURS AS NEEDED FOR COUGH, WHEEZING, OR SHORTNESS OF BREATH (Patient taking differently:  Inhale 2 puffs into the lungs every 4 (four) hours as needed. INHALE 2 PUFFS BY MOUTH EVERY 4 HOURS AS NEEDED FOR COUGH, WHEEZING, OR SHORTNESS OF BREATH) 18 g 0  . Alirocumab (PRALUENT) 150 MG/ML SOAJ Inject 1 pen into the skin every 14 (fourteen) days. 2 pen 11  . ASPIRIN ADULT LOW STRENGTH 81 MG EC tablet TAKE 1 Tablet BY MOUTH ONCE DAILY 90 tablet 3  . atorvastatin (LIPITOR) 80 MG tablet Take 1 tablet (80 mg total) by mouth daily. 90 tablet 1  . Blood Glucose Monitoring Suppl (ACCU-CHEK GUIDE) w/Device KIT 1 each by Does not apply route 2 (two) times daily. 1 kit 1  . clopidogrel (PLAVIX) 75 MG tablet Take 1 tablet (75 mg total) by mouth every morning. 90 tablet 1  . Cyanocobalamin (VITAMIN B12 SL) Place 1 tablet under the tongue daily.    . cyclobenzaprine (FLEXERIL) 5 MG tablet Take 1 tablet (5 mg total) by mouth 3 (three) times daily as needed for muscle spasms. (Patient not taking: Reported on 07/10/2020) 30 tablet 0  . DULoxetine (CYMBALTA) 30 MG capsule Take 1 capsule (30 mg total) by mouth 2 (two) times daily. 180 capsule 1  . exemestane (AROMASIN) 25 MG tablet Take 1 tablet (25 mg total) by mouth daily after breakfast. 90 tablet 12  . ezetimibe (ZETIA) 10 MG tablet Take 1 tablet (10 mg total) by mouth daily. 90 tablet 1  . Fluticasone-Umeclidin-Vilant  (TRELEGY ELLIPTA) 100-62.5-25 MCG/INH AEPB Inhale 1 puff into the lungs daily. 180 each 0  . furosemide (LASIX) 40 MG tablet Take 1 tablet (40 mg total) by mouth 2 (two) times daily. (Patient taking differently: Take 20 mg by mouth daily. ) 180 tablet 1  . gabapentin (NEURONTIN) 300 MG capsule TAKE 3 CAPSULES BY MOUTH DAILY AT BEDTIME (Patient taking differently: Take 300 mg by mouth at bedtime. ) 90 capsule 3  . glucose blood (ACCU-CHEK GUIDE) test strip Check blood sugar 2 times per day 100 each 9  . hydrochlorothiazide (HYDRODIURIL) 25 MG tablet Take 1 tablet by mouth once daily 90 tablet 0  . HYDROcodone-acetaminophen (NORCO/VICODIN) 5-325 MG tablet Take 1 tablet by mouth every 6 (six) hours as needed. 5 tablet 0  . hydrocortisone cream 1 % Apply 1 application topically daily as needed for itching.    Marland Kitchen ibuprofen (ADVIL,MOTRIN) 200 MG tablet Take 400 mg by mouth every 6 (six) hours as needed for moderate pain.    . Insulin Pen Needle (PEN NEEDLES) 32G X 4 MM MISC 1.8 mg by Does not apply route daily. 300 each 2  . liraglutide (VICTOZA) 18 MG/3ML SOPN Inject 0.3 mLs (1.8 mg total) into the skin daily. 5 pen 5  . losartan (COZAAR) 100 MG tablet Take 1 tablet by mouth once daily 90 tablet 0  . Melatonin 5 MG CAPS Take 1 capsule by mouth daily as needed (insomnia).    . metoprolol tartrate (LOPRESSOR) 25 MG tablet Take 1 tablet (25 mg total) by mouth 2 (two) times daily. 180 tablet 1  . nitroGLYCERIN (NITROSTAT) 0.4 MG SL tablet Place 1 tablet (0.4 mg total) under the tongue every 5 (five) minutes as needed for chest pain. 100 tablet 0  . OXYGEN Inhale 3 L into the lungs as needed.    . potassium chloride SA (K-DUR,KLOR-CON) 20 MEQ tablet Take 2 tablets (40 mEq total) by mouth daily. (Patient taking differently: Take 20 mEq by mouth once. ) 180 tablet 1  . TURMERIC PO Take 1  tablet by mouth daily.    . VENTOLIN HFA 108 (90 Base) MCG/ACT inhaler Inhale 2 puffs into the lungs every 4 (four) hours as  needed for wheezing or shortness of breath (or cough). (Patient not taking: Reported on 07/10/2020) 18 g 2  . VITAMIN D PO Take 1 tablet by mouth daily.    Marland Kitchen VITAMIN E PO Take 1 tablet by mouth daily.     No current facility-administered medications for this visit.    OBJECTIVE: white woman who appears stated age  59:   07/16/20 1349  BP: 140/70  Pulse: 76  Resp: 18  Temp: 97.9 F (36.6 C)  SpO2: 95%     Body mass index is 62.22 kg/m.   Wt Readings from Last 3 Encounters:  07/16/20 (!) 318 lb 9 oz (144.5 kg)  07/10/20 (!) 332 lb 14.3 oz (151 kg)  05/16/20 (!) 311 lb 3.2 oz (141.2 kg)      ECOG FS:2 - Symptomatic, <50% confined to bed  Sclerae unicteric, EOMs intact Wearing a mask No cervical or supraclavicular adenopathy Lungs no rales or rhonchi Heart regular rate and rhythm Abd soft, obese, nontender, positive bowel sounds MSK no focal spinal tenderness, no upper extremity lymphedema Neuro: nonfocal, well oriented, appropriate affect Breasts: The right breast is status post lumpectomy followed by radiation.  I do not palpate a suspicious mass.  The lateral area where she is having some itching there is some skin irritation without evidence of infection or inflammation.  It may well be mechanical secondary to her bra.  Left breast and both axillae are benign   LAB RESULTS:  CMP     Component Value Date/Time   NA 137 07/14/2019 1126   K 4.1 07/14/2019 1126   CL 100 07/14/2019 1126   CO2 26 07/14/2019 1126   GLUCOSE 137 (H) 07/14/2019 1126   BUN 12 07/14/2019 1126   CREATININE 0.84 07/14/2019 1126   CREATININE 0.87 07/09/2012 1603   CALCIUM 9.5 07/14/2019 1126   PROT 7.4 07/14/2019 1126   PROT 6.2 01/31/2019 1200   ALBUMIN 3.5 07/14/2019 1126   ALBUMIN 3.9 01/31/2019 1200   AST 14 (L) 07/14/2019 1126   ALT 13 07/14/2019 1126   ALKPHOS 78 07/14/2019 1126   BILITOT 1.3 (H) 07/14/2019 1126   GFRNONAA >60 07/14/2019 1126   GFRAA >60 07/14/2019 1126    No  results found for: TOTALPROTELP, ALBUMINELP, A1GS, A2GS, BETS, BETA2SER, GAMS, MSPIKE, SPEI  No results found for: KPAFRELGTCHN, LAMBDASER, KAPLAMBRATIO  Lab Results  Component Value Date   WBC 5.8 07/16/2020   NEUTROABS 3.6 07/16/2020   HGB 12.6 07/16/2020   HCT 38.9 07/16/2020   MCV 87.0 07/16/2020   PLT 212 07/16/2020   No results found for: LABCA2  No components found for: KKXFGH829  No results for input(s): INR in the last 168 hours.  No results found for: LABCA2  No results found for: HBZ169  No results found for: CVE938  No results found for: BOF751  No results found for: CA2729  No components found for: HGQUANT  No results found for: CEA1 / No results found for: CEA1   No results found for: AFPTUMOR  No results found for: CHROMOGRNA  No results found for: HGBA, HGBA2QUANT, HGBFQUANT, HGBSQUAN (Hemoglobinopathy evaluation)   No results found for: LDH  Lab Results  Component Value Date   IRON 38 (L) 02/10/2012   TIBC 310 02/10/2012   IRONPCTSAT 12 (L) 02/10/2012   (Iron and TIBC)  Lab Results  Component Value Date   FERRITIN 27 02/09/2018    Urinalysis    Component Value Date/Time   COLORURINE YELLOW 01/12/2018 0840   APPEARANCEUR HAZY (A) 01/12/2018 0840   LABSPEC 1.028 01/12/2018 0840   PHURINE 5.0 01/12/2018 0840   GLUCOSEU NEGATIVE 01/12/2018 0840   HGBUR NEGATIVE 01/12/2018 0840   BILIRUBINUR NEGATIVE 01/12/2018 0840   KETONESUR NEGATIVE 01/12/2018 0840   PROTEINUR NEGATIVE 01/12/2018 0840   UROBILINOGEN 1.0 12/08/2014 1303   NITRITE NEGATIVE 01/12/2018 0840   LEUKOCYTESUR TRACE (A) 01/12/2018 0840    STUDIES: Epidural Steroid injection  Result Date: 06/26/2020 Magnus Sinning, MD     06/28/2020 11:50 AM Lumbar Epidural Steroid Injection - Interlaminar Approach with Fluoroscopic Guidance Patient: MARRIANA HIBBERD     Date of Birth: 1961/01/20 MRN: [id number] PCP: Gaylan Gerold, MD     Visit Date: 06/26/2020  Universal Protocol:    Consent Given By: the patient Position: PRONE Additional Comments: Vital signs were monitored before and after the procedure. Patient was prepped and draped in the usual sterile fashion. The correct patient, procedure, and site was verified. Injection Procedure Details: Procedure Site One Meds Administered: Meds ordered this encounter Medications . methylPREDNISolone acetate (DEPO-MEDROL) injection 40 mg  Laterality: Right Location/Site: L5-S1 Needle size: 20 G Needle type: Tuohy Needle Placement: Paramedian epidural Findings:  -Comments: Excellent flow of contrast into the epidural space. Procedure Details: Using a paramedian approach from the side mentioned above, the region overlying the inferior lamina was localized under fluoroscopic visualization and the soft tissues overlying this structure were infiltrated with 4 ml. of 1% Lidocaine without Epinephrine. The Tuohy needle was inserted into the epidural space using a paramedian approach. The epidural space was localized using loss of resistance along with lateral and bi-planar fluoroscopic views.  After negative aspirate for air, blood, and CSF, a 2 ml. volume of Isovue-250 was injected into the epidural space and the flow of contrast was observed. Radiographs were obtained for documentation purposes.  The injectate was administered into the level noted above. Additional Comments: The patient tolerated the procedure well Dressing: 2 x 2 sterile gauze and Band-Aid  Post-procedure details: Patient was observed during the procedure. Post-procedure instructions were reviewed. Patient left the clinic in stable condition.  XR C-ARM NO REPORT  Result Date: 06/26/2020 Please see Notes tab for imaging impression.    ELIGIBLE FOR AVAILABLE RESEARCH PROTOCOL: no   ASSESSMENT: 59 y.o. [place], [place] woman status post right breast upper outer quadrant biopsy 06/04/2018 for a clinical T1c N0, stage IA invasive ductal carcinoma, grade 1, estrogen and progesterone  receptor strongly positive, HER-2 not amplified, with an MIB-108%  (1) status post right lumpectomy and sentinel lymph node sampling 07/19/2018 for a pT1c pN1, stage IB invasive ductal carcinoma, grade 1, with negative margins  (2) Mammaprint obtained on the definitive surgical specimen showed "low risk", predicting a risk of recurrence outside the breast within 5 years of 2 to 3% if the patient takes antiestrogens for 5 years.  No significant benefit from chemotherapy anticipated  (3) adjuvant radiation 09/01/2018 - 10/19/2018  Site/dose: The patient initially received a dose of 50.4 Gy in 28 fractions to the right breast using whole-breast tangent fields. This was delivered using a 3-D conformal technique. The patient then received a boost to the seroma. This delivered an additional 10 Gy in 5 fractions using 15x photons with a Complex Isodose technique. The total dose was 60.4 Gy.  (4) started anastrozole 11/28/2018, discontinued after 4  weeks with diarrhea and other side effects  (5) exemestane started January 2020.   PLAN: Lillan is now 2 years out from definitive surgery for her breast cancer with no evidence of disease recurrence.  This is very favorable.  She is tolerating exemestane well and the plan will be to continue aromatase inhibitors for a total of 5years, which will take [place] through December 2024.  I commended her on receiving the [place] vaccine.  She knows to call for any other issue that may develop before the next visit, which will be in 1 year.  Total encounter time 25 minutes.*  Juliet Vasbinder, Virgie Dad, MD  07/16/20 2:01 PM Medical Oncology and Hematology [facility] [place], [place] [address] Tel. [phone]    Fax. [phone]   I, Wilburn Mylar, am acting as scribe for Dr. Virgie Dad. Omer Puccinelli.  I, Lurline Del MD, have reviewed the above documentation for accuracy and completeness, and I agree with the above.    *Total  Encounter Time as defined by the Centers for Medicare and Medicaid Services includes, in addition to the face-to-face time of a patient visit (documented in the note above) non-face-to-face time: obtaining and reviewing outside history, ordering and reviewing medications, tests or procedures, care coordination (communications with other health care professionals or caregivers) and documentation in the medical record.

## 2020-07-16 ENCOUNTER — Other Ambulatory Visit: Payer: Self-pay

## 2020-07-16 ENCOUNTER — Inpatient Hospital Stay: Payer: Medicaid Other

## 2020-07-16 ENCOUNTER — Inpatient Hospital Stay: Payer: Medicaid Other | Attending: Oncology | Admitting: Oncology

## 2020-07-16 VITALS — BP 140/70 | HR 76 | Temp 97.9°F | Resp 18 | Ht 60.0 in | Wt 318.6 lb

## 2020-07-16 DIAGNOSIS — Z170 Estrogen receptor positive status [ER+]: Secondary | ICD-10-CM

## 2020-07-16 DIAGNOSIS — I110 Hypertensive heart disease with heart failure: Secondary | ICD-10-CM | POA: Diagnosis not present

## 2020-07-16 DIAGNOSIS — E1159 Type 2 diabetes mellitus with other circulatory complications: Secondary | ICD-10-CM

## 2020-07-16 DIAGNOSIS — N8502 Endometrial intraepithelial neoplasia [EIN]: Secondary | ICD-10-CM

## 2020-07-16 DIAGNOSIS — Z6841 Body Mass Index (BMI) 40.0 and over, adult: Secondary | ICD-10-CM

## 2020-07-16 DIAGNOSIS — E785 Hyperlipidemia, unspecified: Secondary | ICD-10-CM | POA: Insufficient documentation

## 2020-07-16 DIAGNOSIS — Z79811 Long term (current) use of aromatase inhibitors: Secondary | ICD-10-CM | POA: Diagnosis not present

## 2020-07-16 DIAGNOSIS — I10 Essential (primary) hypertension: Secondary | ICD-10-CM | POA: Diagnosis not present

## 2020-07-16 DIAGNOSIS — Z79899 Other long term (current) drug therapy: Secondary | ICD-10-CM | POA: Diagnosis not present

## 2020-07-16 DIAGNOSIS — Z794 Long term (current) use of insulin: Secondary | ICD-10-CM | POA: Insufficient documentation

## 2020-07-16 DIAGNOSIS — G4733 Obstructive sleep apnea (adult) (pediatric): Secondary | ICD-10-CM

## 2020-07-16 DIAGNOSIS — I5032 Chronic diastolic (congestive) heart failure: Secondary | ICD-10-CM | POA: Insufficient documentation

## 2020-07-16 DIAGNOSIS — J449 Chronic obstructive pulmonary disease, unspecified: Secondary | ICD-10-CM | POA: Insufficient documentation

## 2020-07-16 DIAGNOSIS — F329 Major depressive disorder, single episode, unspecified: Secondary | ICD-10-CM | POA: Diagnosis not present

## 2020-07-16 DIAGNOSIS — Z8249 Family history of ischemic heart disease and other diseases of the circulatory system: Secondary | ICD-10-CM | POA: Insufficient documentation

## 2020-07-16 DIAGNOSIS — G4730 Sleep apnea, unspecified: Secondary | ICD-10-CM | POA: Diagnosis not present

## 2020-07-16 DIAGNOSIS — G2581 Restless legs syndrome: Secondary | ICD-10-CM | POA: Insufficient documentation

## 2020-07-16 DIAGNOSIS — I252 Old myocardial infarction: Secondary | ICD-10-CM | POA: Diagnosis not present

## 2020-07-16 DIAGNOSIS — D649 Anemia, unspecified: Secondary | ICD-10-CM

## 2020-07-16 DIAGNOSIS — E119 Type 2 diabetes mellitus without complications: Secondary | ICD-10-CM | POA: Diagnosis not present

## 2020-07-16 DIAGNOSIS — Z87891 Personal history of nicotine dependence: Secondary | ICD-10-CM | POA: Insufficient documentation

## 2020-07-16 DIAGNOSIS — Z1239 Encounter for other screening for malignant neoplasm of breast: Secondary | ICD-10-CM

## 2020-07-16 DIAGNOSIS — Z803 Family history of malignant neoplasm of breast: Secondary | ICD-10-CM | POA: Insufficient documentation

## 2020-07-16 DIAGNOSIS — C50411 Malignant neoplasm of upper-outer quadrant of right female breast: Secondary | ICD-10-CM

## 2020-07-16 DIAGNOSIS — Z86711 Personal history of pulmonary embolism: Secondary | ICD-10-CM | POA: Insufficient documentation

## 2020-07-16 DIAGNOSIS — I152 Hypertension secondary to endocrine disorders: Secondary | ICD-10-CM

## 2020-07-16 LAB — CBC WITH DIFFERENTIAL/PLATELET
Abs Immature Granulocytes: 0.02 10*3/uL (ref 0.00–0.07)
Basophils Absolute: 0.1 10*3/uL (ref 0.0–0.1)
Basophils Relative: 1 %
Eosinophils Absolute: 0.4 10*3/uL (ref 0.0–0.5)
Eosinophils Relative: 6 %
HCT: 38.9 % (ref 36.0–46.0)
Hemoglobin: 12.6 g/dL (ref 12.0–15.0)
Immature Granulocytes: 0 %
Lymphocytes Relative: 23 %
Lymphs Abs: 1.3 10*3/uL (ref 0.7–4.0)
MCH: 28.2 pg (ref 26.0–34.0)
MCHC: 32.4 g/dL (ref 30.0–36.0)
MCV: 87 fL (ref 80.0–100.0)
Monocytes Absolute: 0.4 10*3/uL (ref 0.1–1.0)
Monocytes Relative: 7 %
Neutro Abs: 3.6 10*3/uL (ref 1.7–7.7)
Neutrophils Relative %: 63 %
Platelets: 212 10*3/uL (ref 150–400)
RBC: 4.47 MIL/uL (ref 3.87–5.11)
RDW: 13.1 % (ref 11.5–15.5)
WBC: 5.8 10*3/uL (ref 4.0–10.5)
nRBC: 0 % (ref 0.0–0.2)

## 2020-07-16 LAB — COMPREHENSIVE METABOLIC PANEL
ALT: 15 U/L (ref 0–44)
AST: 15 U/L (ref 15–41)
Albumin: 3.4 g/dL — ABNORMAL LOW (ref 3.5–5.0)
Alkaline Phosphatase: 66 U/L (ref 38–126)
Anion gap: 10 (ref 5–15)
BUN: 14 mg/dL (ref 6–20)
CO2: 27 mmol/L (ref 22–32)
Calcium: 9.6 mg/dL (ref 8.9–10.3)
Chloride: 103 mmol/L (ref 98–111)
Creatinine, Ser: 0.9 mg/dL (ref 0.44–1.00)
GFR calc Af Amer: >60 mL/min (ref >60)
GFR calc non Af Amer: >60 mL/min (ref >60)
Glucose, Bld: 153 mg/dL — ABNORMAL HIGH (ref 70–99)
Potassium: 4.4 mmol/L (ref 3.5–5.1)
Sodium: 140 mmol/L (ref 135–145)
Total Bilirubin: 1 mg/dL (ref 0.3–1.2)
Total Protein: 6.9 g/dL (ref 6.5–8.1)

## 2020-07-17 ENCOUNTER — Other Ambulatory Visit: Payer: Self-pay | Admitting: *Deleted

## 2020-07-17 ENCOUNTER — Telehealth: Payer: Self-pay | Admitting: Oncology

## 2020-07-17 MED ORDER — LIRAGLUTIDE 18 MG/3ML ~~LOC~~ SOPN: 1.8000 mg | PEN_INJECTOR | Freq: Every day | SUBCUTANEOUS | 5 refills | Status: DC

## 2020-07-17 NOTE — Telephone Encounter (Signed)
Scheduled appts per 7/19 los. Pt confirmed appt date and time.

## 2020-07-18 ENCOUNTER — Telehealth: Payer: Self-pay | Admitting: *Deleted

## 2020-07-18 NOTE — Telephone Encounter (Addendum)
Information was sent through CoverMyMeds fro PA for [employer]. Awaiting determination within 24 hours.  Sander Nephew, RN 07/18/2020 11:42 AM.  Donna Bernard PA was approved starting 04/19/2020 until further notice.  Sander Nephew, RN 67/21/2021 9:07 AM.

## 2020-07-26 ENCOUNTER — Telehealth: Payer: Self-pay | Admitting: Orthopaedic Surgery

## 2020-07-26 ENCOUNTER — Ambulatory Visit (INDEPENDENT_AMBULATORY_CARE_PROVIDER_SITE_OTHER): Payer: Medicaid Other | Admitting: Orthopaedic Surgery

## 2020-07-26 ENCOUNTER — Encounter: Payer: Self-pay | Admitting: Orthopaedic Surgery

## 2020-07-26 DIAGNOSIS — M545 Low back pain, unspecified: Secondary | ICD-10-CM

## 2020-07-26 DIAGNOSIS — G8929 Other chronic pain: Secondary | ICD-10-CM | POA: Diagnosis not present

## 2020-07-26 NOTE — Telephone Encounter (Signed)
Pt would like to know if we received any paperwork from [employer] regarding a scooter?  [phone]

## 2020-07-26 NOTE — Progress Notes (Signed)
The patient comes in for continued follow-up as relates to severe low back pain with radicular symptoms.  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  She also has grade 1 spondylolisthesis at L4-L5 and L5-S1.  This is causing severe chronic facet arthropathy.  She has had some injections by Dr. Ernestina Patches and that is only helped for short amount of time.  I spoke to her in length in detail that likely this is not something that a surgical intervention can address but she would still like to see Dr. Louanne Skye to see if he has an opinion about her spine.  From my standpoint I do feel that she would benefit from weight loss and she is a candidate for a power mobility device such as a [employer]over[employer].  She has the dexterity and coordination for of her upper extremities to mobilize in a device such as a power mobility device but not a manual wheelchair or a walker.  She is a fall risk.  Her home will accommodate a power mobility device.  She has limited strength in her bilateral lower extremities.  She has the need for trunk support of her midsection that would be best suited from power mobility device as well.  She would need this to help participate in her activities daily living at home such as getting to the bathroom into the kitchen and dressing.  She will see about getting the paperwork to [place] to fill out for a power mobility device.  We will also set up a one-time appointment with her to see Dr. Louanne Skye to evaluate her lumbar spine.

## 2020-07-27 NOTE — Telephone Encounter (Signed)
No ma'am I have not received anything, maybe they can re-fax and "ATTN: Zohaib Heeney"

## 2020-07-29 DIAGNOSIS — Z419 Encounter for procedure for purposes other than remedying health state, unspecified: Secondary | ICD-10-CM | POA: Diagnosis not present

## 2020-07-30 ENCOUNTER — Telehealth: Payer: Self-pay

## 2020-07-30 DIAGNOSIS — F3342 Major depressive disorder, recurrent, in full remission: Secondary | ICD-10-CM

## 2020-07-30 MED ORDER — DULOXETINE HCL 30 MG PO CPEP: 30.0000 mg | ORAL_CAPSULE | Freq: Two times a day (BID) | ORAL | 1 refills | Status: DC

## 2020-07-30 NOTE — Telephone Encounter (Signed)
TC to patient, she states she changed to [facility] and they are requiring PA's on the RX's she has requested (alberol, duloxetine, and Trelegy).   Will send request to PA nurse. Thank you, SChaplin, RN,BSN

## 2020-07-30 NOTE — Telephone Encounter (Signed)
Need PA on ellipta Need to change albuterol inhaler to [facility] for insurance to pay Needs refills on duloxetine Sending to yellow team and gladys h. RN

## 2020-07-30 NOTE — Telephone Encounter (Signed)
albuterol (VENTOLIN HFA) 108 (90 Base) MCG/ACT inhaler  DULoxetine (CYMBALTA) 30 MG capsule,,  Fluticasone-Umeclidin-Vilant (TRELEGY ELLIPTA) 100-62.5-25 MCG/INH AEPB, per patient the insurance is requesting more information from the office. Please call pt back.

## 2020-07-31 ENCOUNTER — Other Ambulatory Visit: Payer: Self-pay | Admitting: Orthopaedic Surgery

## 2020-08-01 ENCOUNTER — Other Ambulatory Visit: Payer: Self-pay

## 2020-08-01 DIAGNOSIS — J441 Chronic obstructive pulmonary disease with (acute) exacerbation: Secondary | ICD-10-CM

## 2020-08-01 MED ORDER — TRELEGY ELLIPTA 100-62.5-25 MCG/INH IN AEPB: 1.0000 | INHALATION_SPRAY | Freq: Every day | RESPIRATORY_TRACT | 1 refills | Status: DC

## 2020-08-01 NOTE — Telephone Encounter (Signed)
Requesting to speak with a nurse about Fluticasone-Umeclidin-Vilant (TRELEGY ELLIPTA) 100-62.5-25 MCG/INH AEPB. Please call pt back.

## 2020-08-02 ENCOUNTER — Telehealth: Payer: Self-pay | Admitting: *Deleted

## 2020-08-02 NOTE — Telephone Encounter (Signed)
Information was faxed to [facility] for PA for Trelegy.  Awaiting decision.

## 2020-08-02 NOTE — Telephone Encounter (Signed)
Pt calls and is concerned about PA on trelegy ellipta, states she is getting very low. Called gladysh. Rn, she is working on [employer]

## 2020-08-02 NOTE — Telephone Encounter (Addendum)
Call to [place] for PA for Trelegy.  Spoke with representatives.  Information was completed and faxed to [facility].  Awaiting determination.  Sander Nephew, RN 08/02/2020 3:04 PM. Damaris Schooner with representative from [facility] patient's claim denied.  Given more information rep to contact Pharmacy for override.  Sander Nephew, RN 08/03/2020 11:35 AM.   RTC to [facility] PA was approved for 30 day supply at 30 for 30 days using generic.  [place] [id number].  Ref# [id number] and [id number].   Call to Pharmacist from [facility] given # to call if problems doing medication as generic.   [phone].  Sander Nephew, RN 08/03/2020

## 2020-08-03 ENCOUNTER — Telehealth: Payer: Self-pay | Admitting: Specialist

## 2020-08-03 ENCOUNTER — Other Ambulatory Visit: Payer: Self-pay | Admitting: Internal Medicine

## 2020-08-03 DIAGNOSIS — J441 Chronic obstructive pulmonary disease with (acute) exacerbation: Secondary | ICD-10-CM

## 2020-08-03 MED ORDER — TRELEGY ELLIPTA 100-62.5-25 MCG/INH IN AEPB: 1.0000 | INHALATION_SPRAY | Freq: Every day | RESPIRATORY_TRACT | 2 refills | Status: DC

## 2020-08-03 NOTE — Telephone Encounter (Signed)
Pt calling back about her PA for Trelegy Refill.  Pt requesting a call back in reference to turn around time for Refilling this Medication

## 2020-08-03 NOTE — Telephone Encounter (Signed)
Pt called stating she's trying to get a motorized scooter and needs a PT eval first so she would like to have a referral put in; pt states she was originally seeing Dr.Blackman and then he referred her to [place] but she hasn't had her first appt yet so she wasn't sure who should put in the referral  [phone]

## 2020-08-04 ENCOUNTER — Other Ambulatory Visit: Payer: Self-pay | Admitting: Oncology

## 2020-08-06 NOTE — Telephone Encounter (Signed)
Can you see if Dr. Ninfa Linden can advise on this message, as we have not seen her yet?

## 2020-08-06 NOTE — Telephone Encounter (Signed)
Please call back and speak with the pharmacy in reference to the patient Prior Authorization Phone number @ [phone].

## 2020-08-06 NOTE — Telephone Encounter (Signed)
Call to [place].  Spoke to [place].  Rick Duff to call [facility] about PA.

## 2020-08-07 NOTE — Telephone Encounter (Signed)
So, I talked to the patient and she states they said the letter was perfect but we didn't states in there where she had tried and failed physical therapy. We did send her to PT for her shoulder and back in the past. Can we somehow add that in there? If that's ok with you I can add it and you can sign tomorrow?

## 2020-08-07 NOTE — Telephone Encounter (Signed)
I thought I just did a letter for her and filled out all the [facility] information for her as it relates to her need for a power mobility device.

## 2020-08-07 NOTE — Telephone Encounter (Signed)
Pls contact [phone]

## 2020-08-07 NOTE — Telephone Encounter (Signed)
Does this make since to you? I guess just send her to PT for eval for chair??

## 2020-08-08 ENCOUNTER — Other Ambulatory Visit: Payer: Self-pay | Admitting: *Deleted

## 2020-08-08 DIAGNOSIS — J441 Chronic obstructive pulmonary disease with (acute) exacerbation: Secondary | ICD-10-CM

## 2020-08-08 MED ORDER — TRELEGY ELLIPTA 100-62.5-25 MCG/INH IN AEPB: 1.0000 | INHALATION_SPRAY | Freq: Every day | RESPIRATORY_TRACT | 2 refills | Status: DC

## 2020-08-08 NOTE — Telephone Encounter (Signed)
I am fine with you adding any part to the letter stating that she has been through physical therapy.

## 2020-08-08 NOTE — Telephone Encounter (Signed)
Corrected and sent to [facility]

## 2020-08-10 ENCOUNTER — Telehealth: Payer: Self-pay

## 2020-08-10 NOTE — Telephone Encounter (Signed)
RTC, patient states she spoke with pharmacist and he told her PA is still not right.  Please call pharmacy. Forwarding to [employer], RN,BSN

## 2020-08-10 NOTE — Telephone Encounter (Signed)
Requesting to speak with a nurse about Fluticasone-Umeclidin-Vilant (TRELEGY ELLIPTA) 100-62.5-25 MCG/INH AEPB. Please call back.

## 2020-08-22 ENCOUNTER — Telehealth: Payer: Self-pay

## 2020-08-22 ENCOUNTER — Ambulatory Visit (INDEPENDENT_AMBULATORY_CARE_PROVIDER_SITE_OTHER): Payer: Medicaid Other

## 2020-08-22 ENCOUNTER — Ambulatory Visit (INDEPENDENT_AMBULATORY_CARE_PROVIDER_SITE_OTHER): Payer: Medicaid Other | Admitting: Specialist

## 2020-08-22 ENCOUNTER — Other Ambulatory Visit: Payer: Self-pay

## 2020-08-22 ENCOUNTER — Encounter: Payer: Self-pay | Admitting: Specialist

## 2020-08-22 VITALS — BP 162/72 | HR 89 | Ht 62.5 in | Wt 314.0 lb

## 2020-08-22 DIAGNOSIS — M48061 Spinal stenosis, lumbar region without neurogenic claudication: Secondary | ICD-10-CM

## 2020-08-22 DIAGNOSIS — M4807 Spinal stenosis, lumbosacral region: Secondary | ICD-10-CM | POA: Diagnosis not present

## 2020-08-22 DIAGNOSIS — M545 Low back pain, unspecified: Secondary | ICD-10-CM

## 2020-08-22 DIAGNOSIS — M4316 Spondylolisthesis, lumbar region: Secondary | ICD-10-CM | POA: Diagnosis not present

## 2020-08-22 DIAGNOSIS — M5136 Other intervertebral disc degeneration, lumbar region: Secondary | ICD-10-CM

## 2020-08-22 DIAGNOSIS — G8929 Other chronic pain: Secondary | ICD-10-CM

## 2020-08-22 NOTE — Progress Notes (Signed)
Office Visit Note   Patient: [name]           Date of Birth: 1961/01/24           MRN: [id number] Visit Date: 08/22/2020              Requested by: Gaylan Gerold, DO [address] [place],  [place] [address] PCP: Gaylan Gerold, DO   Assessment & Plan: Visit Diagnoses:  1. Chronic low back pain, unspecified back pain laterality, unspecified whether sciatica present   2. Spinal stenosis of lumbar region without neurogenic claudication   3. Spondylolisthesis, lumbar region   4. Degenerative lumbar disc     Plan: Avoid bending, stooping and avoid lifting weights greater than 10 lbs. Avoid prolong standing and walking. Avoid frequent bending and stooping  No lifting greater than 10 lbs. May use ice or moist heat for pain. Weight loss is of benefit. Handicap license is approved. ESI did not help. Cardiac history will require clearance if surgery is consider. Myelogram and post myelogram CT Scan to assess for stenosis that is likely severe L4-5 and L5-S1 due to dynamic  Spondylolisthesis not adequately evaluated with MRI.  Referral for a bariatric procedure is likely a reasonable solution that your primary care physician would need to initiate, I am not Allowed to. Follow-Up Instructions: Return in about 4 weeks (around 09/19/2020).   Orders:  Orders Placed This Encounter  Procedures  . XR Lumb Spine Flex&Ext Only   No orders of the defined types were placed in this encounter.     Procedures: No procedures performed   Clinical Data: Findings:  Narrative & Impression CLINICAL DATA:  59 year old female with central mid to low back pain radiating to the sacrum for months to years. Numbness. No prior surgery.  EXAM: MRI LUMBAR SPINE WITHOUT CONTRAST  TECHNIQUE: Multiplanar, multisequence MR imaging of the lumbar spine was performed. No intravenous contrast was administered.  COMPARISON:  Lumbar radiographs 04/11/2020. CT Abdomen and  Pelvis 01/12/2018.  FINDINGS: Segmentation:  Normal on the comparisons.  Alignment: Grade 1 anterolisthesis at both L4-L5 and L5-S1 was more apparent on the weight-bearing lumbar radiographs. Lumbar lordosis otherwise appears stable since 2019.  Vertebrae: No pars fracture identified. No marrow edema or evidence of acute osseous abnormality. Chronic degenerative endplate marrow signal changes T12-L1 and L1-L2. Normal background bone marrow signal. Intact visible sacrum and SI joints.  Conus medullaris and cauda equina: Conus extends to the L1 level. C T12-L1 level details below. No lower spinal cord or conus signal abnormality.  Paraspinal and other soft tissues: Negative.  Disc levels:  T11-T12. Mild disc bulging and facet hypertrophy. No stenosis.  T12-L1: Severe chronic disc degeneration with chronic vacuum disc. Circumferential disc osteophyte complex and mild posterior element hypertrophy. Mild spinal stenosis at the level of the conus (series 3, image 8 and series 6, image 5). No conus mass effect. Mild to moderate left T12 foraminal stenosis.  L1-L2:  Minor disc bulge. No stenosis.  L2-L3:  Negative.  L3-L4: Mild disc desiccation. Mild facet and ligament flavum hypertrophy. No stenosis.  L4-L5: Circumferential but mostly far lateral disc bulging. Moderate facet hypertrophy. Vacuum facet here in 2019. No spinal or lateral recess stenosis. Mild to moderate left and borderline to mild right L4 foraminal stenosis.  L5-S1: Minor disc bulge. Severe facet hypertrophy. Vacuum facet here in 2019. No spinal or lateral recess stenosis. Mild to moderate bilateral L5 foraminal stenosis.  IMPRESSION: 1. No acute osseous abnormality in the  lumbar spine. But grade 1 spondylolisthesis at L4-L5 and L5-S1 as seen on the weight-bearing radiographs this year is associated with severe chronic facet arthropathy. No spinal stenosis, but mild to moderate L4 and  L5 foraminal stenosis.  2. Severe chronic disc degeneration at T12-L1 with mild spinal stenosis there at the level of the conus. No conus mass effect. Mild to moderate left T12 neural foraminal stenosis.  3. Mild for age lumbar spine degeneration elsewhere.   Electronically Signed   By: Genevie Ann M.D.   On: 05/23/2020 09:37       Subjective: Chief Complaint  Patient presents with  . Lower Back - Follow-up, Pain    59 year old female with history of back pain and radiation into the backs of the thighs and down to the calves. Saw Dr. Nelva Nay due to increasing symptoms of leg pain with standing and walking in neurogenic claudication pattern. She leans to get back to stop hurting and has to sit to cook for about 4 years now. This has been occurring for almost 5 years. She uses a cane for ambulation and the electric cart at the the grocery store. Her pain on a scale of 1-10 with standing it is an 8-9. When sitting it is about a 3. The legs get tired  And she has fallen several times straight forward on to her face. [place] in the kitchen and broke her tailbone in May 2021.There is numbness and tingling in the backs of the legs. No bowel or bladder difficulty. She is married and 2 boys 68 and 55. She has been to PT sent by her primary care MD Gaylan Gerold with internal medicine. She was in PT for about 2-3 weeks about over one year ago. Was referred to Dr. Ninfa Linden and had an MRI. She has some back pain at night and uses a memory foam.   Review of Systems  Constitutional: Positive for activity change and unexpected weight change (lost 60 lbs over 2 years from360 now at 314.). Negative for appetite change, chills, diaphoresis, fatigue and fever.  HENT: Positive for dental problem (edentulous), rhinorrhea, sinus pressure and sinus pain. Negative for congestion (seasonal), drooling, ear discharge, ear pain, facial swelling, hearing loss, mouth sores, nosebleeds, postnasal drip, sneezing, sore  throat, tinnitus, trouble swallowing and voice change.   Eyes: Negative.  Negative for photophobia, pain, discharge, redness, itching and visual disturbance.  Respiratory: Positive for apnea. Negative for cough, choking, chest tightness, wheezing and stridor. Shortness of breath: problems with breathing.   Cardiovascular: Negative for chest pain, palpitations and leg swelling.  Gastrointestinal: Negative.  Negative for abdominal distention, abdominal pain, anal bleeding, blood in stool, constipation, diarrhea, nausea, rectal pain and vomiting.  Endocrine: Negative.  Negative for cold intolerance, heat intolerance, polydipsia, polyphagia and polyuria.  Genitourinary: Negative.  Negative for decreased urine volume, difficulty urinating, dyspareunia, dysuria, enuresis, flank pain, frequency and hematuria.  Musculoskeletal: Positive for back pain and gait problem. Negative for arthralgias (bilateral knee replacements then right knee revision), joint swelling, myalgias, neck pain and neck stiffness.  Skin: Negative.  Negative for color change, pallor, rash and wound.  Allergic/Immunologic: Negative.  Negative for environmental allergies, food allergies and immunocompromised state.  Neurological: Positive for weakness and numbness. Negative for dizziness, tremors, seizures, syncope, facial asymmetry, speech difficulty, light-headedness and headaches.  Hematological: Negative for adenopathy. Does not bruise/bleed easily (takes plavix and aspirin ).  Psychiatric/Behavioral: Negative for agitation, behavioral problems, confusion, decreased concentration, dysphoric mood, hallucinations, self-injury, sleep disturbance and  suicidal ideas. The patient is not nervous/anxious and is not hyperactive.      Objective: Vital Signs: BP (!) 162/72 (BP Location: Left Arm, Patient Position: Sitting)   Pulse 89   Ht 5' 2.5" (1.588 m)   Wt (!) 314 lb (142.4 kg)   LMP 02/03/2012   BMI 56.52 kg/m   Physical  Exam Constitutional:      General: She is not in acute distress.    Appearance: Normal appearance. She is well-developed. She is obese. She is not ill-appearing, toxic-appearing or diaphoretic.  HENT:     Head: Normocephalic and atraumatic.  Eyes:     Pupils: Pupils are equal, round, and reactive to light.  Pulmonary:     Effort: Pulmonary effort is normal.     Breath sounds: Normal breath sounds.  Abdominal:     General: Bowel sounds are normal.     Palpations: Abdomen is soft.  Musculoskeletal:     Cervical back: Normal range of motion and neck supple.     Lumbar back: Negative right straight leg raise test and negative left straight leg raise test.  Skin:    General: Skin is warm and dry.  Neurological:     Mental Status: She is alert and oriented to person, place, and time.  Psychiatric:        Behavior: Behavior normal.        Thought Content: Thought content normal.        Judgment: Judgment normal.     Back Exam   Tenderness  The patient is experiencing tenderness in the lumbar.  Range of Motion  Extension: abnormal  Flexion: abnormal  Lateral bend right: abnormal  Lateral bend left: abnormal  Rotation right: abnormal  Rotation left: abnormal   Muscle Strength  Right Quadriceps:  5/5  Left Quadriceps:  5/5  Right Hamstrings:  5/5  Left Hamstrings:  5/5   Tests  Straight leg raise right: negative Straight leg raise left: negative  Reflexes  Patellar: 0/4 Achilles: 0/4 Babinski's sign: normal   Other  Toe walk: abnormal Heel walk: abnormal Sensation: decreased Gait: antalgic   Comments:  Stooped posture on standing. Unable to fully extend lumbar spine to neutral alignment.       Specialty Comments:  No specialty comments available.  Imaging: XR Lumb Spine Flex&Ext Only  Result Date: 08/22/2020 AP and lateral flexion and extension radiographs of the lumbar spine show DDD L1-2 and T12-L1 greater than L3-4 and L4-5. Grade 1-2 anterolisthesis  on standing at L4-5  With 27mm shift L4 on L5 and less at L5-S1 2-3 mm. Hips without arthrosis and SI joints are well maintained.     PMFS History: Patient Active Problem List   Diagnosis Date Noted  . Tick bite 04/06/2020  . Stiffness of hand joint 06/09/2019  . Right arm numbness 06/09/2019  . Low back pain 05/02/2019  . Pain of left hip joint 08/09/2018  . Malignant neoplasm of upper-outer quadrant of right breast in female, estrogen receptor positive ([place]) 06/10/2018  . Screening for colon cancer 03/12/2018  . Screening for breast cancer 03/12/2018  . Restless leg syndrome 02/04/2018  . 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 Depression 08/06/2015  . 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 Hypertension associated with diabetes ([place])   . Aortic stenosis   . Morbid obesity with body mass index of 60.0-69.9 in adult [facility])   . Chronic diastolic congestive heart failure ([place])   .  COPD (chronic obstructive pulmonary disease) ([place]) 09/21/2013  . Obstructive sleep apnea 09/21/2013  . Nocturnal hypoxemia 09/12/2013  . Abnormal CT scan, chest 05/12/2013  . Hyperlipidemia 11/30/2012  . History of pulmonary embolus (PE) 07/09/2012  . Long term (current) use of anticoagulants 02/20/2012  . Normocytic anemia 02/08/2012  . Type 2 diabetes mellitus without complication, without long-term current use of insulin ([place]) 02/08/2012  . Asthma 02/08/2012  . GERD (gastroesophageal reflux disease)   . CAD (coronary artery disease) 12/08/2011   Past Medical History:  Diagnosis Date  . Anemia   . Aortic valve stenosis, severe   . 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 Asthma   . Blood transfusion    [facility]  . Breast cancer in female [facility])    Right  . CAD (coronary artery disease)    Cath 2010 with DES x 1 RCA-- PT'S  CARDIOLOGIST IS DR. [place]  . Chronic diastolic congestive heart failure ([place])   . COPD (chronic obstructive pulmonary disease) ([place])   . Depression   . Diabetes mellitus DIAGNOSED IN2010   Controll s with diet  . Eczema    on back  . Hyperlipidemia   . Hypertension   . Kidney stones   . Morbid obesity with body mass index of 60.0-69.9 in adult [facility])   . Myocardial infarction ([place])    PT THINKS SHE WAS DX WITH MI AT THE TIME OF HEART STENTING  . Pulmonary embolism ([place]) 02/08/12   S/P RT TOTAL KNEE ON 02/03/12--ON 02/08/12--DEVELOPED ACUTE SOB AND CHEST PAIN--AND DIAGNOSED WITH  PULMONARY EMBOLUS AND PNEUMONIA  . Restless leg syndrome   . Sleep apnea    uses 3 liters O2 at night   . Uterine fibroid    NO PROBLEMS AT PRESENT FROM THE FIBROIDS-STATES SHE IS POST MENOPAUSAL-LAST MENSES 2010 EXCEPT FOR EPISODE THIS YR OF BLEEDING RELATED TO FIBROIDS.  Marland Kitchen Weakness    BOTH HANDS - S/P BILATERAL CARPAL TUNNEL RELEASE--BUT STILL HAS WEAKNESS--OFTEN DROPS THINGS    Family History  Problem Relation Age of Onset  . Breast cancer Mother        stage IV at diagnosis  . Emphysema Mother        smoked  . Heart disease Mother   . COPD Father        smoked  . Asthma Father   . Heart disease Father   . Cancer Brother        Sinus    Past Surgical History:  Procedure Laterality Date  . BREAST LUMPECTOMY Right 06/2018  . BREAST LUMPECTOMY WITH RADIOACTIVE SEED AND SENTINEL LYMPH NODE BIOPSY Right 07/19/2018   Procedure: BREAST LUMPECTOMY WITH RADIOACTIVE SEED AND SENTINEL LYMPH NODE BIOPSY;  Surgeon: Alphonsa Overall, MD;  Location: [place];  Service: General;  Laterality: Right;  . CARDIAC CATHETERIZATION    . CARPAL TUNNEL RELEASE     Bilateral  . CHOLECYSTECTOMY    . CORONARY ANGIOPLASTY     2010 has stent in place  . CYSTOSCOPY W/ RETROGRADES Right 09/21/2013   Procedure: CYSTOSCOPY WITH RIGHT RETROGRADE PYELOGRAM RIGHT DOUBLE J STENT ;  Surgeon: Fredricka Bonine, MD;  Location: [facility];  Service: Urology;  Laterality: Right;  . CYSTOSCOPY WITH URETEROSCOPY AND STENT PLACEMENT Right 10/25/2013   Procedure: CYSTOSCOPY RIGHT URETEROSCOPY HOLMIUM LASER LITHO AND STENT PLACEMENT;  Surgeon:  Fredricka Bonine, MD;  Location: [facility] ORS;  Service: Urology;  Laterality: Right;  . HERNIA REPAIR    . INTRAOPERATIVE TRANSESOPHAGEAL ECHOCARDIOGRAM N/A 12/12/2014   Procedure: INTRAOPERATIVE TRANSESOPHAGEAL ECHOCARDIOGRAM;  Surgeon: Burnell Blanks, MD;  Location[facility] OR;  Service: Cardiovascular;  Laterality: N/A;  . JOINT REPLACEMENT     bil total knees  . KNEE ARTHROPLASTY  02/03/2012   Procedure: COMPUTER ASSISTED TOTAL KNEE ARTHROPLASTY;  Surgeon: Mcarthur Rossetti, MD;  Location: [place];  Service: Orthopedics;  Laterality: Right;  Right total knee arthroplasty  . LEFT AND RIGHT HEART CATHETERIZATION WITH CORONARY ANGIOGRAM N/A 03/17/2013   Procedure: LEFT AND RIGHT HEART CATHETERIZATION WITH CORONARY ANGIOGRAM;  Surgeon: Burnell Blanks, MD;  Location[facility] CATH LAB;  Service: Cardiovascular;  Laterality: N/A;  . LEFT AND RIGHT HEART CATHETERIZATION WITH CORONARY/GRAFT ANGIOGRAM N/A 09/14/2014   Procedure: LEFT AND RIGHT HEART CATHETERIZATION WITH Beatrix Fetters;  Surgeon: Burnell Blanks, MD;  Location: [facility] CATH LAB;  Service: Cardiovascular;  Laterality: N/A;  . TEE WITHOUT CARDIOVERSION N/A 03/14/2013   Procedure: TRANSESOPHAGEAL ECHOCARDIOGRAM (TEE);  Surgeon: Lelon Perla, MD;  Location: [facility] ENDOSCOPY;  Service: Cardiovascular;  Laterality: N/A;  . TEE WITHOUT CARDIOVERSION N/A 11/14/2014   Procedure: TRANSESOPHAGEAL ECHOCARDIOGRAM (TEE);  Surgeon: Lelon Perla, MD;  Location: [facility] ENDOSCOPY;  Service: Cardiovascular;  Laterality: N/A;  . TOTAL KNEE ARTHROPLASTY  09/10/2012   Procedure: TOTAL KNEE ARTHROPLASTY;  Surgeon: Mcarthur Rossetti, MD;  Location[facility];  Service: Orthopedics;  Laterality: Left;  . TOTAL KNEE REVISION Right 07/15/2013    Procedure: REVISION ARTHROPLASTY RIGHT KNEE;  Surgeon: Mcarthur Rossetti, MD;  Location[facility];  Service: Orthopedics;  Laterality: Right;  . TRANSCATHETER AORTIC VALVE REPLACEMENT, TRANSFEMORAL N/A 12/12/2014   Procedure: TRANSCATHETER AORTIC VALVE REPLACEMENT, TRANSFEMORAL;  Surgeon: Burnell Blanks, MD;  Location: [place];  Service: Cardiovascular;  Laterality: N/A;  . TRIGGER FINGER RELEASE  09/10/2012   Procedure: RELEASE TRIGGER FINGER/A-1 PULLEY;  Surgeon: Mcarthur Rossetti, MD;  Location: [facility];  Service: Orthopedics;  Laterality: Right;  Right Ring Finger  . TUBAL LIGATION     Social History   Occupational History  . Occupation: Disabled  Tobacco Use  . Smoking status: Former Smoker    Packs/day: 1.50    Years: 30.00    Pack years: 45.00    Types: Cigarettes    Quit date: 12/29/2000    Years since quitting: 19.6  . Smokeless tobacco: Never Used  Vaping Use  . Vaping Use: Never used  Substance and Sexual Activity  . Alcohol use: Yes    Comment: rarely  . Drug use: No  . Sexual activity: Not Currently    Birth control/protection: Surgical

## 2020-08-22 NOTE — Patient Instructions (Addendum)
Plan: Avoid bending, stooping and avoid lifting weights greater than 10 lbs. Avoid prolong standing and walking. Avoid frequent bending and stooping  No lifting greater than 10 lbs. May use ice or moist heat for pain. Weight loss is of benefit. Handicap license is approved. ESI did not help. Cardiac history will require clearance if surgery is consider. Myelogram and post myelogram CT Scan to assess for stenosis that is likely severe L4-5 and L5-S1 due to dynamic  Spondylolisthesis not adequately evaluated with MRI.  Referral for a bariatric procedure is likely a reasonable solution that your primary care physician would need to initiate, I am not Allowed to. Follow-Up Instructions: Return in about 4 weeks (around 09/19/2020).

## 2020-08-22 NOTE — Telephone Encounter (Signed)
Patient returned my call so that her medications could be reviewed before she is scheduled for a myelogram.  She stated an understanding she will be here for two hours, will need a driver and will need to be on strict bedrest (explained) for 24 hours after the procedure.  She also stated an understanding that she needs to hold Cymbalta for 48 hours before, and 24 hours after, the myelogram.

## 2020-08-23 ENCOUNTER — Ambulatory Visit: Payer: Medicaid Other | Admitting: Specialist

## 2020-08-28 ENCOUNTER — Telehealth: Payer: Self-pay | Admitting: Pharmacist

## 2020-08-28 MED ORDER — REPATHA SURECLICK 140 MG/ML ~~LOC~~ SOAJ: 1.0000 "pen " | SUBCUTANEOUS | 11 refills | Status: DC

## 2020-08-29 ENCOUNTER — Ambulatory Visit
Admission: RE | Admit: 2020-08-29 | Discharge: 2020-08-29 | Disposition: A | Discharge status: Home / Self Care | Payer: Medicaid Other | Source: Ambulatory Visit | Attending: Specialist | Admitting: Specialist

## 2020-08-29 DIAGNOSIS — Z419 Encounter for procedure for purposes other than remedying health state, unspecified: Secondary | ICD-10-CM | POA: Diagnosis not present

## 2020-08-29 DIAGNOSIS — M5126 Other intervertebral disc displacement, lumbar region: Secondary | ICD-10-CM | POA: Diagnosis not present

## 2020-08-29 DIAGNOSIS — M4807 Spinal stenosis, lumbosacral region: Secondary | ICD-10-CM

## 2020-08-29 DIAGNOSIS — M48061 Spinal stenosis, lumbar region without neurogenic claudication: Secondary | ICD-10-CM

## 2020-08-29 DIAGNOSIS — M4316 Spondylolisthesis, lumbar region: Secondary | ICD-10-CM

## 2020-08-29 IMAGING — XA DG MYELOGRAPHY LUMBAR INJ LUMBOSACRAL
13 of 17 series · 13 of 17 positions shown · non-contrast
Comparison: Lumbar spine MRI [DATE]

CLINICAL DATA: Lumbosacral spinal stenosis. Low back pain radiating
to the legs.
TECHNIQUE: Contiguous axial images were obtained through the Lumbar spine after
the intrathecal infusion of infusion. Coronal and sagittal
reconstructions were obtained of the axial image sets.

[Series 1: w lumbar spine lat · 0.15mm/px · 1 of 1 slices shown]
[im 1/1]
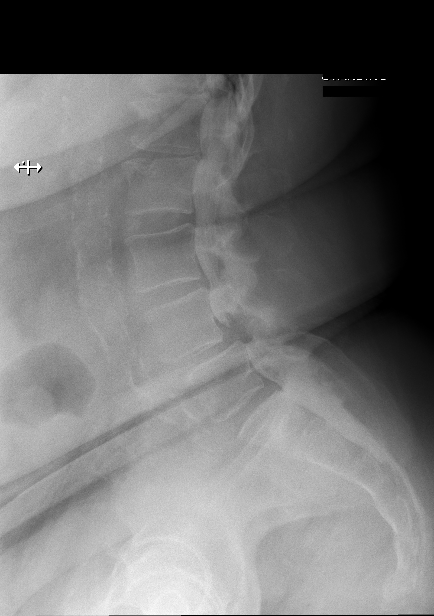

[Series 1: vasc adipose · 1 of 1 slices shown (1 of 11)]
[im 1/1]
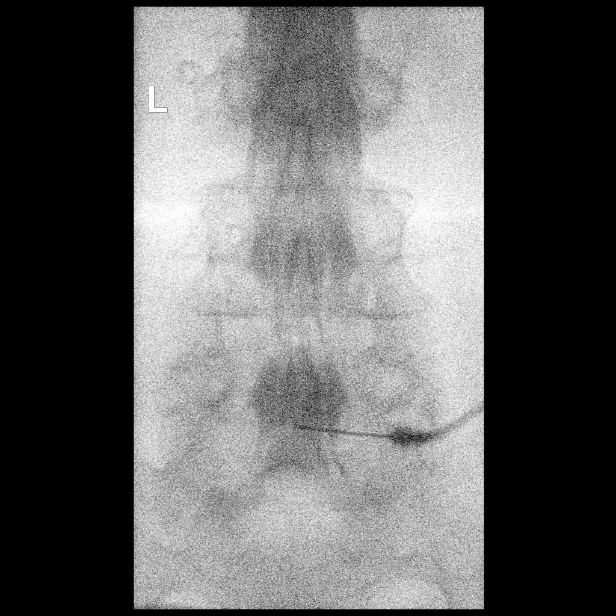

[Series 2: vasc adipose · 1 of 1 slices shown (2 of 11)]
[im 1/1]
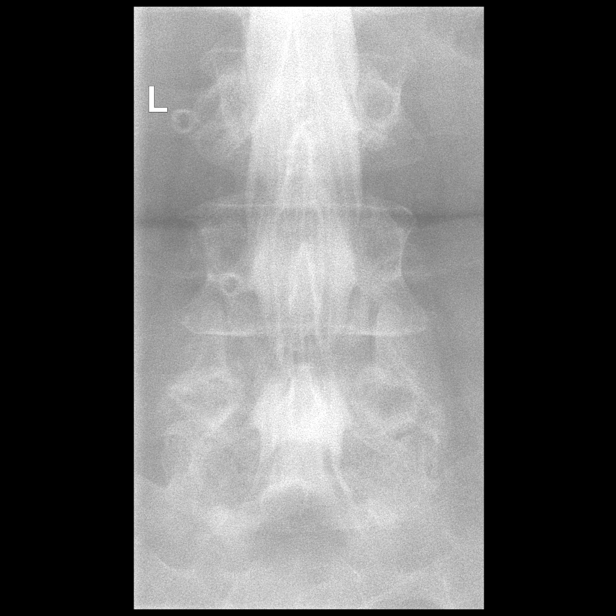

[Series 3: w lumbar spine extension · 0.15mm/px · 1 of 1 slices shown]
[im 1/1]
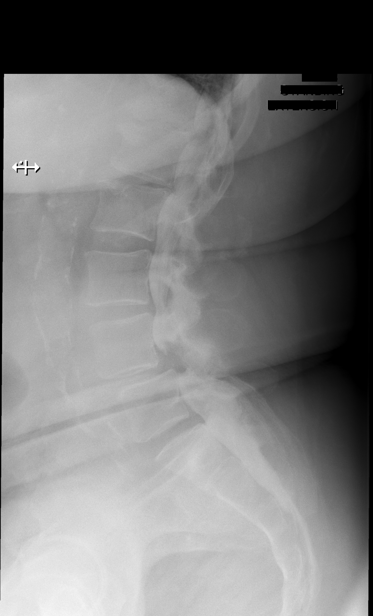

[Series 3: vasc adipose · 1 of 1 slices shown (3 of 11)]
[im 1/1]
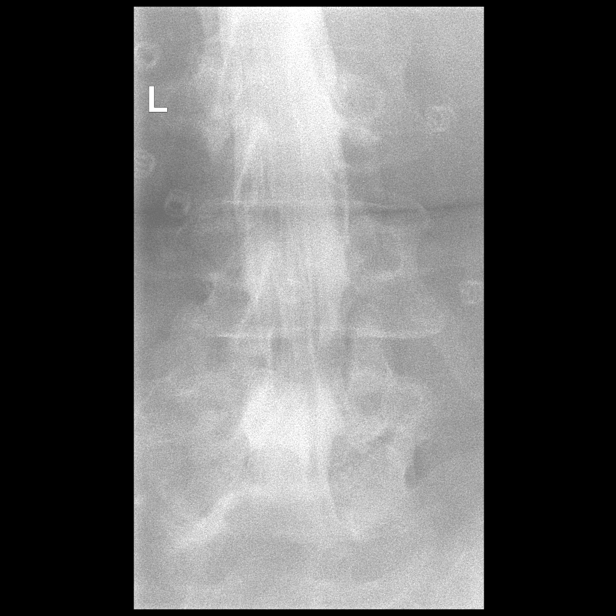

[Series 5: vasc adipose · 1 of 1 slices shown (4 of 11)]
[im 1/1]
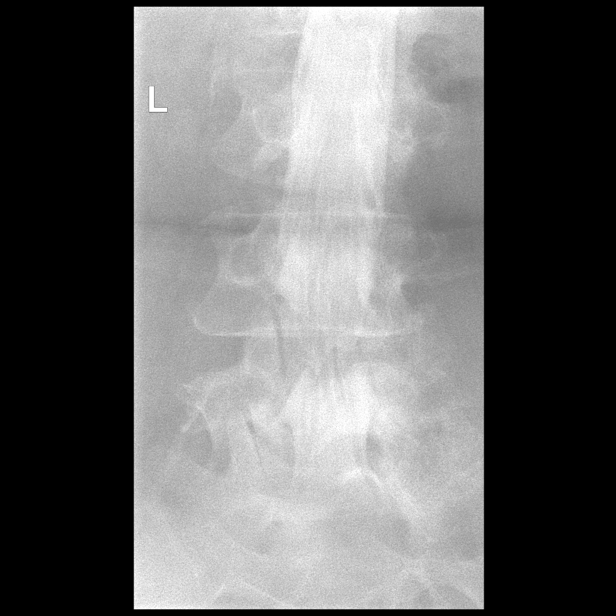

[Series 6: vasc adipose · 1 of 1 slices shown (5 of 11)]
[im 1/1]
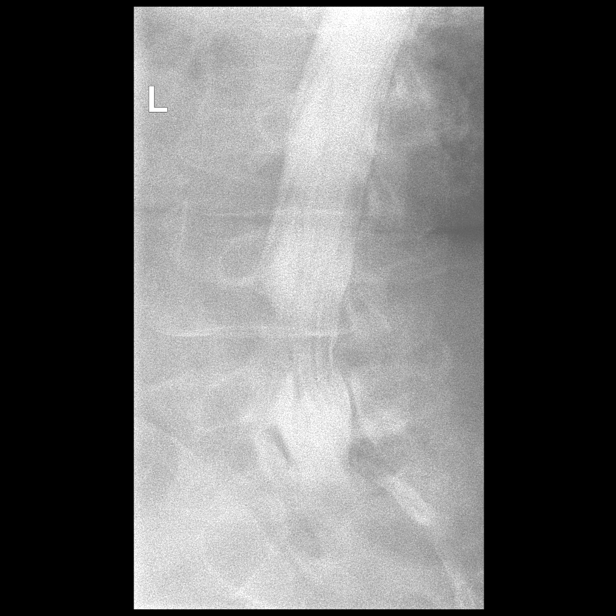

[Series 7: vasc adipose · 1 of 1 slices shown (6 of 11)]
[im 1/1]
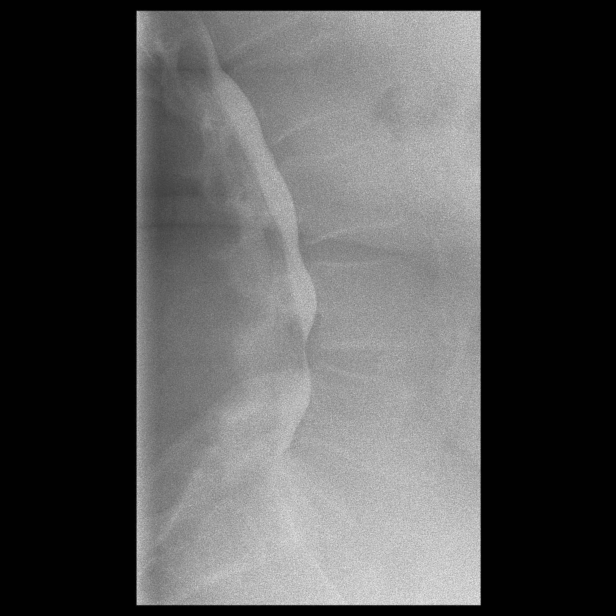

[Series 9: vasc adipose · 1 of 1 slices shown (7 of 11)]
[im 1/1]
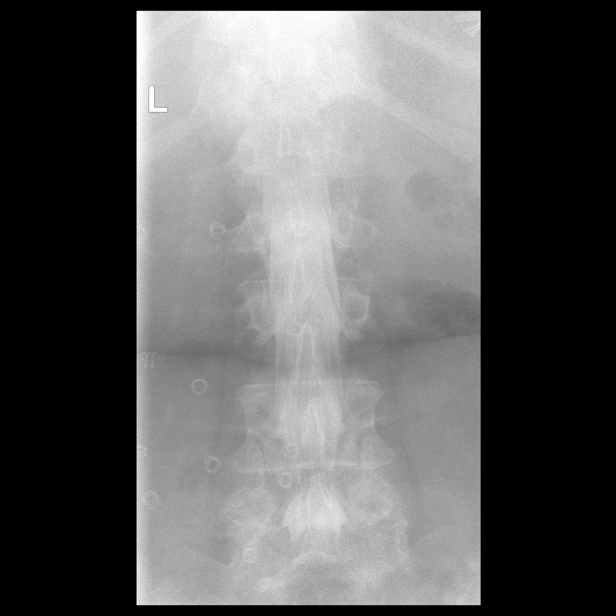

[Series 10: vasc adipose · 1 of 1 slices shown (8 of 11)]
[im 1/1]
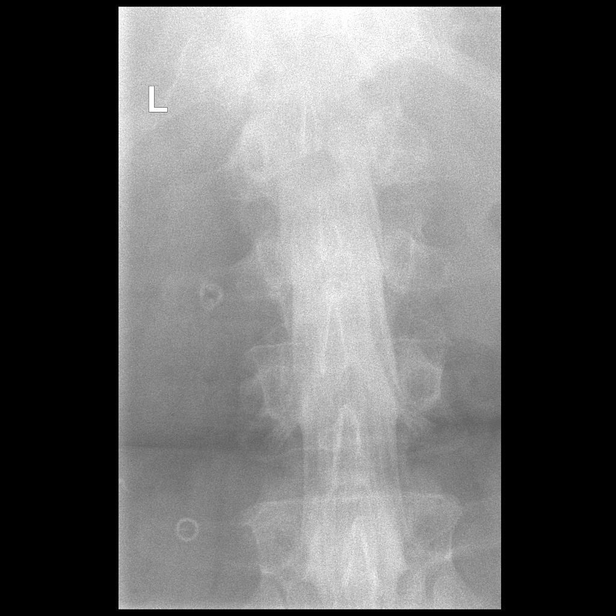

[Series 11: vasc adipose · 1 of 1 slices shown (9 of 11)]
[im 1/1]
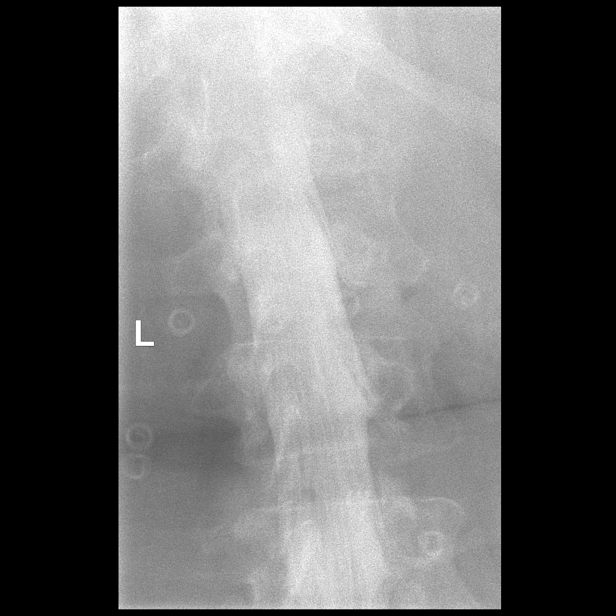

[Series 13: vasc adipose · 1 of 1 slices shown (10 of 11)]
[im 1/1]
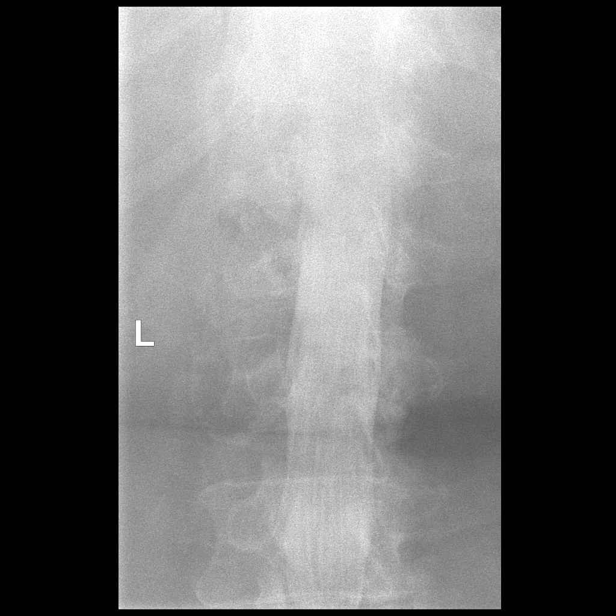

[Series 14: vasc adipose · 1 of 1 slices shown (11 of 11)]
[im 1/1]
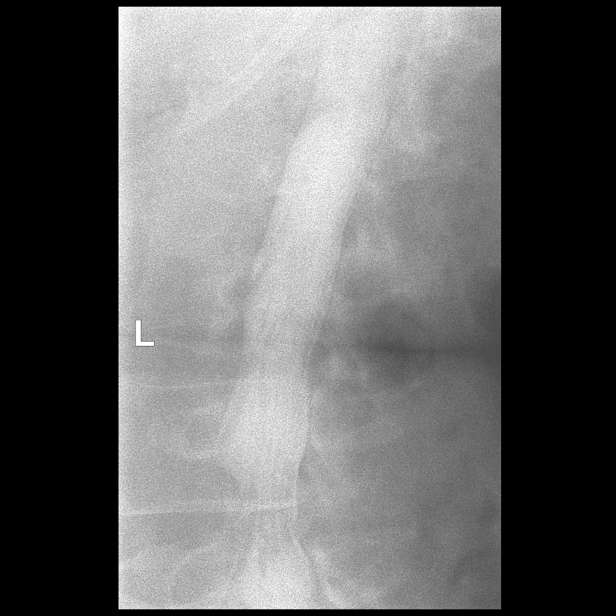

[13 of 17 positions shown; findings below may reference images not displayed]

EXAM:
LUMBAR MYELOGRAM

FLUOROSCOPY TIME:  Fluoroscopy Time: 2 minutes 19 seconds

Radiation Exposure Index: 781.24 microGray*m^2

PROCEDURE:
After thorough discussion of risks and benefits of the procedure
including bleeding, infection, injury to nerves, blood vessels,
adjacent structures as well as headache and CSF leak, written and
oral informed consent was obtained. Consent was obtained by Dr.
BHARATHI. Time out form was completed.

Patient was positioned prone on the fluoroscopy table. Local
anesthesia was provided with 1% lidocaine without epinephrine after
prepped and draped in the usual sterile fashion. Puncture was
performed at L5-S1 using a 5 inch 22-gauge spinal needle via a right
interlaminar approach. Using a single pass through the dura, the
needle was placed within the thecal sac, with return of clear CSF.
15 mL of Isovue [NF] was injected into the thecal sac, with normal
opacification of the nerve roots and cauda equina consistent with
free flow within the subarachnoid space.

I personally performed the lumbar puncture and administered the
intrathecal contrast. I also personally supervised acquisition of
the myelogram images.
FINDINGS: LUMBAR MYELOGRAM FINDINGS:

There are 5 non rib-bearing lumbar type vertebrae. Vertebral
alignment is normal in the prone position, however with standing
there develops grade 1 anterolisthesis at L4-5 with a large ventral
extradural defect resulting in severe spinal stenosis. This does not
significantly change with flexion or extension. Minimal
anterolisthesis of L5 on S1 develops with standing without
associated stenosis. A small ventral extradural defect at L3-4 at
most slightly increases with standing and contributes to mild spinal
stenosis.

CT LUMBAR MYELOGRAM FINDINGS:

Vertebral alignment is normal on this supine CT. No fracture or
suspicious osseous lesion is identified. There is a small Schmorl's
node involving the L2 superior endplate. There are prominent
degenerative endplate changes at T12-L1 including sclerosis and
spurring with vacuum disc. The conus medullaris terminates at L1-2.
There is abdominal aortic atherosclerosis without aneurysm.

T12-L1: Circumferential disc bulging, endplate spurring, and mild
facet arthrosis result in mild spinal stenosis and mild bilateral
neural foraminal stenosis, unchanged.

L1-2: Minimal disc bulging without stenosis, unchanged.

L2-3: Negative.

L3-4: Minimal disc bulging and mild facet hypertrophy result in
borderline spinal stenosis without neural foraminal stenosis (mild
spinal stenosis on standing radiographs).

L4-5: Disc bulging and severe bilateral facet arthrosis result in
mild spinal stenosis and borderline to mild right and
mild-to-moderate left neural foraminal stenosis (severe spinal
stenosis on standing radiographs).

L5-S1: Severe bilateral facet arthrosis result in borderline
bilateral neural foraminal stenosis. No disc herniation or spinal
stenosis.
IMPRESSION: 1. Severe facet arthrosis at L4-5 and L5-S1 with grade 1
anterolisthesis developing at both levels with standing.
2. Severe spinal stenosis at L4-5 with standing.
3. Mild spinal stenosis at T12-L1 and L3-4.
4. Aortic Atherosclerosis ([NF]-[NF]).

## 2020-08-29 IMAGING — CT CT L SPINE W/ CM
2 of 8 series · 4 of 14 positions shown, 5 images · non-contrast
Comparison: Lumbar spine MRI [DATE]

CLINICAL DATA: Lumbosacral spinal stenosis. Low back pain radiating
to the legs.
TECHNIQUE: Contiguous axial images were obtained through the Lumbar spine after
the intrathecal infusion of infusion. Coronal and sagittal
reconstructions were obtained of the axial image sets.

[Series 3: l spine soft · axial · 0.35mm/px · z∈[-228,-150]mm · 2 of 80 slices shown]
[im 27/80  soft-tissue]
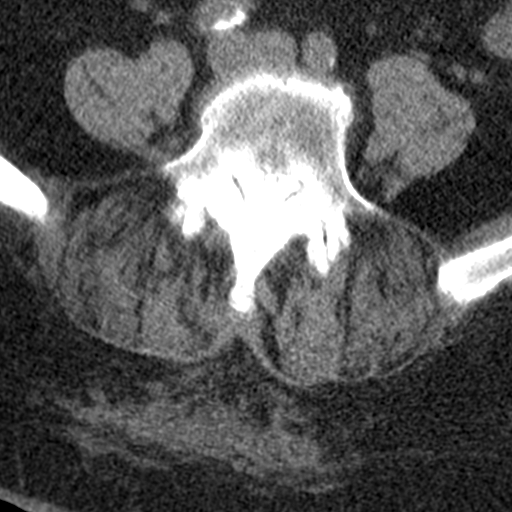
[im 53/80  soft-tissue]
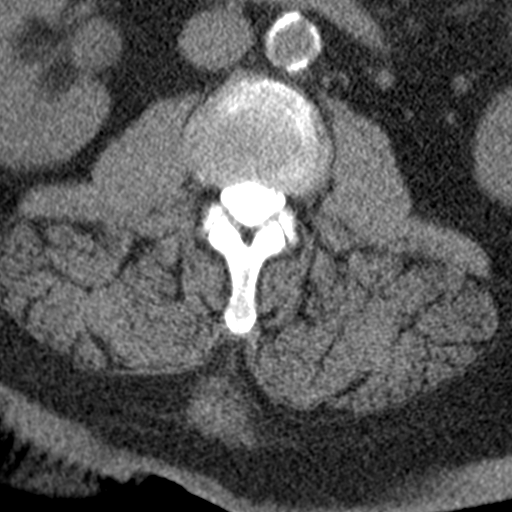

[Series 4: l spine bone · axial · 0.35mm/px · z∈[-225,-147]mm · 2 of 79 slices shown, 3 images]
[im 27/79  soft-tissue]
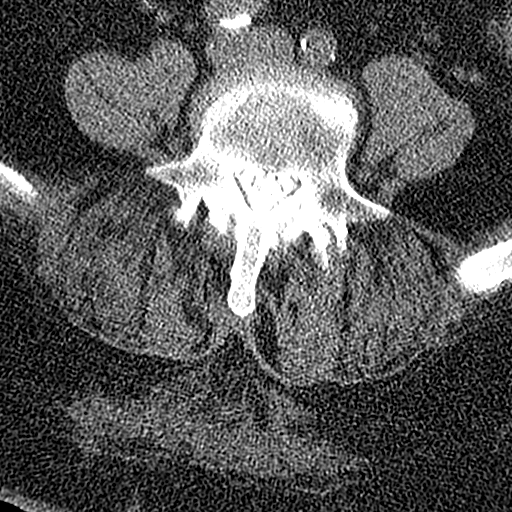
[im 27/79  bone]
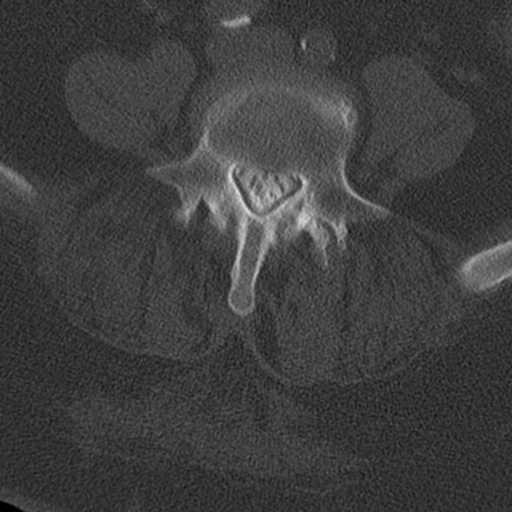
[im 53/79  bone]
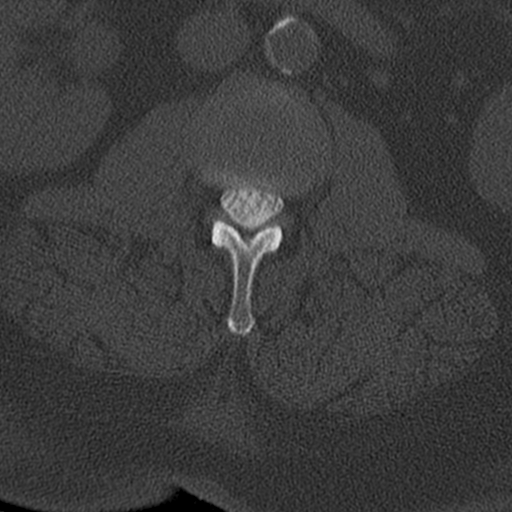

[4 of 14 positions shown; findings below may reference images not displayed]

EXAM:
LUMBAR MYELOGRAM

FLUOROSCOPY TIME:  Fluoroscopy Time: 2 minutes 19 seconds

Radiation Exposure Index: 781.24 microGray*m^2

PROCEDURE:
After thorough discussion of risks and benefits of the procedure
including bleeding, infection, injury to nerves, blood vessels,
adjacent structures as well as headache and CSF leak, written and
oral informed consent was obtained. Consent was obtained by Dr.
BHARATHI. Time out form was completed.

Patient was positioned prone on the fluoroscopy table. Local
anesthesia was provided with 1% lidocaine without epinephrine after
prepped and draped in the usual sterile fashion. Puncture was
performed at L5-S1 using a 5 inch 22-gauge spinal needle via a right
interlaminar approach. Using a single pass through the dura, the
needle was placed within the thecal sac, with return of clear CSF.
15 mL of Isovue [NF] was injected into the thecal sac, with normal
opacification of the nerve roots and cauda equina consistent with
free flow within the subarachnoid space.

I personally performed the lumbar puncture and administered the
intrathecal contrast. I also personally supervised acquisition of
the myelogram images.
FINDINGS: LUMBAR MYELOGRAM FINDINGS:

There are 5 non rib-bearing lumbar type vertebrae. Vertebral
alignment is normal in the prone position, however with standing
there develops grade 1 anterolisthesis at L4-5 with a large ventral
extradural defect resulting in severe spinal stenosis. This does not
significantly change with flexion or extension. Minimal
anterolisthesis of L5 on S1 develops with standing without
associated stenosis. A small ventral extradural defect at L3-4 at
most slightly increases with standing and contributes to mild spinal
stenosis.

CT LUMBAR MYELOGRAM FINDINGS:

Vertebral alignment is normal on this supine CT. No fracture or
suspicious osseous lesion is identified. There is a small Schmorl's
node involving the L2 superior endplate. There are prominent
degenerative endplate changes at T12-L1 including sclerosis and
spurring with vacuum disc. The conus medullaris terminates at L1-2.
There is abdominal aortic atherosclerosis without aneurysm.

T12-L1: Circumferential disc bulging, endplate spurring, and mild
facet arthrosis result in mild spinal stenosis and mild bilateral
neural foraminal stenosis, unchanged.

L1-2: Minimal disc bulging without stenosis, unchanged.

L2-3: Negative.

L3-4: Minimal disc bulging and mild facet hypertrophy result in
borderline spinal stenosis without neural foraminal stenosis (mild
spinal stenosis on standing radiographs).

L4-5: Disc bulging and severe bilateral facet arthrosis result in
mild spinal stenosis and borderline to mild right and
mild-to-moderate left neural foraminal stenosis (severe spinal
stenosis on standing radiographs).

L5-S1: Severe bilateral facet arthrosis result in borderline
bilateral neural foraminal stenosis. No disc herniation or spinal
stenosis.
IMPRESSION: 1. Severe facet arthrosis at L4-5 and L5-S1 with grade 1
anterolisthesis developing at both levels with standing.
2. Severe spinal stenosis at L4-5 with standing.
3. Mild spinal stenosis at T12-L1 and L3-4.
4. Aortic Atherosclerosis ([NF]-[NF]).

## 2020-08-29 MED ORDER — ONDANSETRON HCL 4 MG/2ML IJ SOLN
4.0000 mg | Freq: Once | INTRAMUSCULAR | Status: AC
  Administered 2020-08-29: 4 mg via INTRAMUSCULAR

## 2020-08-29 MED ORDER — MEPERIDINE HCL 50 MG/ML IJ SOLN
50.0000 mg | Freq: Once | INTRAMUSCULAR | Status: AC
  Administered 2020-08-29: 50 mg via INTRAMUSCULAR

## 2020-08-29 MED ORDER — IOPAMIDOL (ISOVUE-M 200) INJECTION 41%
18.0000 mL | Freq: Once | INTRAMUSCULAR | Status: AC
  Administered 2020-08-29: 18 mL via INTRATHECAL

## 2020-08-29 NOTE — Discharge Instructions (Signed)

## 2020-08-30 NOTE — Telephone Encounter (Signed)
Patient's new managed medicaid does not cover Praluent. Repatha was approved until further notice. Patient was made aware of the change and is ok with it. She will fill locally. I have called [facility] and requested they cancel the Rx for praluent.

## 2020-09-01 ENCOUNTER — Other Ambulatory Visit: Payer: Self-pay | Admitting: Oncology

## 2020-09-04 ENCOUNTER — Other Ambulatory Visit: Payer: Self-pay | Admitting: Internal Medicine

## 2020-09-05 ENCOUNTER — Other Ambulatory Visit: Payer: Self-pay | Admitting: *Deleted

## 2020-09-05 ENCOUNTER — Encounter: Payer: Medicaid Other | Admitting: Student

## 2020-09-05 MED ORDER — PROAIR HFA 108 (90 BASE) MCG/ACT IN AERS: INHALATION_SPRAY | RESPIRATORY_TRACT | 0 refills | Status: DC

## 2020-09-05 NOTE — Telephone Encounter (Signed)
Call from [facility] that Dr Alfonse Spruce is not medicaid approved. I will send to [facility] to re-send [facility] rx. Thanks

## 2020-09-10 ENCOUNTER — Other Ambulatory Visit: Payer: Self-pay

## 2020-09-10 ENCOUNTER — Telehealth: Payer: Self-pay | Admitting: Specialist

## 2020-09-10 DIAGNOSIS — M545 Low back pain, unspecified: Secondary | ICD-10-CM

## 2020-09-10 DIAGNOSIS — M4316 Spondylolisthesis, lumbar region: Secondary | ICD-10-CM

## 2020-09-10 DIAGNOSIS — M25552 Pain in left hip: Secondary | ICD-10-CM

## 2020-09-10 DIAGNOSIS — M48061 Spinal stenosis, lumbar region without neurogenic claudication: Secondary | ICD-10-CM

## 2020-09-10 DIAGNOSIS — G8929 Other chronic pain: Secondary | ICD-10-CM

## 2020-09-10 NOTE — Telephone Encounter (Signed)
Patient called. She would like a referral for PT sent to [facility] for a scooter.

## 2020-09-10 NOTE — Telephone Encounter (Signed)
Sent order.

## 2020-09-10 NOTE — Telephone Encounter (Signed)
Can you ask Dr. Ninfa Linden about this? Looks like papers were to be sent to him to be completed for a scooter.

## 2020-09-11 ENCOUNTER — Other Ambulatory Visit: Payer: Self-pay | Admitting: *Deleted

## 2020-09-11 DIAGNOSIS — I5032 Chronic diastolic (congestive) heart failure: Secondary | ICD-10-CM

## 2020-09-11 DIAGNOSIS — I2510 Atherosclerotic heart disease of native coronary artery without angina pectoris: Secondary | ICD-10-CM

## 2020-09-11 DIAGNOSIS — I350 Nonrheumatic aortic (valve) stenosis: Secondary | ICD-10-CM

## 2020-09-11 MED ORDER — HYDROCHLOROTHIAZIDE 25 MG PO TABS: 25.0000 mg | ORAL_TABLET | Freq: Every day | ORAL | 0 refills | Status: DC

## 2020-09-11 MED ORDER — LOSARTAN POTASSIUM 100 MG PO TABS: 100.0000 mg | ORAL_TABLET | Freq: Every day | ORAL | 0 refills | Status: DC

## 2020-09-11 MED ORDER — CLOPIDOGREL BISULFATE 75 MG PO TABS: 75.0000 mg | ORAL_TABLET | Freq: Every morning | ORAL | 1 refills | Status: DC

## 2020-09-11 NOTE — Telephone Encounter (Signed)
Patient to continue taking plavix 75mg , hydrochlorothiazide 25mg , losartan 100mg .  [name], MD PGY-1 IMTS [phone]

## 2020-09-13 NOTE — Telephone Encounter (Signed)
Has this already been addressed.  It still says this is still open.  Please address or advise.

## 2020-09-17 NOTE — Telephone Encounter (Signed)
Call to pharmacy.  Request has been processed.

## 2020-09-25 ENCOUNTER — Other Ambulatory Visit: Payer: Self-pay | Admitting: Student

## 2020-09-25 ENCOUNTER — Other Ambulatory Visit: Payer: Self-pay

## 2020-09-25 ENCOUNTER — Ambulatory Visit (INDEPENDENT_AMBULATORY_CARE_PROVIDER_SITE_OTHER): Payer: Medicaid Other | Admitting: Student

## 2020-09-25 ENCOUNTER — Telehealth: Payer: Self-pay

## 2020-09-25 VITALS — BP 151/72 | HR 104 | Temp 99.1°F | Ht 63.0 in | Wt 316.4 lb

## 2020-09-25 DIAGNOSIS — G629 Polyneuropathy, unspecified: Secondary | ICD-10-CM

## 2020-09-25 DIAGNOSIS — E119 Type 2 diabetes mellitus without complications: Secondary | ICD-10-CM

## 2020-09-25 DIAGNOSIS — M25512 Pain in left shoulder: Secondary | ICD-10-CM | POA: Insufficient documentation

## 2020-09-25 DIAGNOSIS — G8929 Other chronic pain: Secondary | ICD-10-CM

## 2020-09-25 DIAGNOSIS — E1159 Type 2 diabetes mellitus with other circulatory complications: Secondary | ICD-10-CM

## 2020-09-25 DIAGNOSIS — G5691 Unspecified mononeuropathy of right upper limb: Secondary | ICD-10-CM

## 2020-09-25 DIAGNOSIS — Z23 Encounter for immunization: Secondary | ICD-10-CM | POA: Diagnosis not present

## 2020-09-25 DIAGNOSIS — I152 Hypertension secondary to endocrine disorders: Secondary | ICD-10-CM

## 2020-09-25 DIAGNOSIS — I10 Essential (primary) hypertension: Secondary | ICD-10-CM | POA: Diagnosis not present

## 2020-09-25 LAB — POCT GLYCOSYLATED HEMOGLOBIN (HGB A1C): Hemoglobin A1C: 6.8 % — AB (ref 4.0–5.6)

## 2020-09-25 LAB — GLUCOSE, CAPILLARY: Glucose-Capillary: 221 mg/dL — ABNORMAL HIGH (ref 70–99)

## 2020-09-25 MED ORDER — AMLODIPINE BESYLATE 5 MG PO TABS: 5.0000 mg | ORAL_TABLET | Freq: Every day | ORAL | 0 refills | Status: DC

## 2020-09-25 NOTE — Assessment & Plan Note (Signed)
Patient says she previously had carpal tunnel with surgery roughly 20 years ago. Mentions she is worried that it is coming back. Describes tingling in right forearm and thenar eminence daily, not associated with movement or a certain time of day. Denies pain, numbness, involvement of fingers. She says she no longer has the hand brace she previously used.  A/P: -Distribution of symptoms and history not consistent with carpal tunnel. Will check B12, TSH today. Can consider further evaluation at next appointment in 1 month pending labs.

## 2020-09-25 NOTE — Patient Instructions (Addendum)
[name],  It was a pleasure seeing you today!  Today we discussed your blood pressure and diabetes. We are adding a blood pressure medication called amlodipine 5mg , once per day. We will see you back in one month to re-assess your blood pressure. Please make sure to take your medications before you visit.  In addition, we discussed your shoulder pain and tingling in the right hand. We are checking some labs today and will call you with the results. In addition, please make sure to follow-up with your orthopedic doctor for the shoulder pain.  We look forward to seeing you next time. Please call our clinic at [phone] if you have any questions or concerns. The best time to call is Monday-Friday from 9am-4pm, but there is someone available 24/7 at the same number. If you need medication refills, please notify your pharmacy one week in advance and they will send [place] a request.  Thank you for letting us take part in your care. Wishing you the best!  Thank you, Dr. [name], MD

## 2020-09-25 NOTE — Telephone Encounter (Signed)
Holding for [employer]

## 2020-09-25 NOTE — Assessment & Plan Note (Signed)
Patient states she cannot check blood pressure at home as she does not have cuff. Otherwise, denies chest pain, palpitations, dizziness. She says she did not take her blood pressure medications this morning.  BP Readings from Last 3 Encounters:  09/25/20 (!) 151/72  08/29/20 (!) 140/59  08/22/20 (!) 162/72   A/P: -Add amlodipine 5mg  once daily -Continue losartan 100mg  once daily, Lopressor 25mg  twice daily, hydrochlorothiazide 25mg  once daily, Lasix 40mg  once daily -Will re-evaluated and titrate as necessary at follow-up appointment in 1 month. Instructed patient to take medications prior to arrival.

## 2020-09-25 NOTE — Assessment & Plan Note (Signed)
Patient states she is doing well with Victoza and is working on her diet. Mentions she only drinks soda once or twice per month. A1c 6.8 today (6.9 10/2019). Discussion regarding previous metformin use, states she has heard that metformin is associated with cancer. She says she does not want to be put back on metformin as she already has had breast cancer.  A/P: -Continue Victoza 1.8mg  daily -Discussed with patient metformin is safe and effective and not shown to be associated with cancer. Can consider future discussions regarding the medication if A1c increases.

## 2020-09-25 NOTE — Telephone Encounter (Signed)
nicole , [employer] ( [employer]) wanted to leave message that they received form and some questions were left blank , faxed form back overso rest can be completed.

## 2020-09-25 NOTE — Progress Notes (Signed)
   CC: follow-up  HPI:  [name] is a 59 y.o. with medical history as listed below who presents to clinic for follow-up appointment.  Please see problem-based list for further evaluation and details.  Past Medical History:  Diagnosis Date  . Anemia   . Aortic valve stenosis, severe   . 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 Asthma   . Blood transfusion    [facility]  . Breast cancer in female [facility])    Right  . CAD (coronary artery disease)    Cath 2010 with DES x 1 RCA-- PT'S CARDIOLOGIST IS DR. [place]  . Chronic diastolic congestive heart failure ([place])   . COPD (chronic obstructive pulmonary disease) ([place])   . Depression   . Diabetes mellitus DIAGNOSED IN2010   Controll s with diet  . Eczema    on back  . Hyperlipidemia   . Hypertension   . Kidney stones   . Morbid obesity with body mass index of 60.0-69.9 in adult [facility])   . Myocardial infarction ([place])    PT THINKS SHE WAS DX WITH MI AT THE TIME OF HEART STENTING  . Pulmonary embolism ([place]) 02/08/12   S/P RT TOTAL KNEE ON 02/03/12--ON 02/08/12--DEVELOPED ACUTE SOB AND CHEST PAIN--AND DIAGNOSED WITH  PULMONARY EMBOLUS AND PNEUMONIA  . Restless leg syndrome   . Sleep apnea    uses 3 liters O2 at night   . Uterine fibroid    NO PROBLEMS AT PRESENT FROM THE FIBROIDS-STATES SHE IS POST MENOPAUSAL-LAST MENSES 2010 EXCEPT FOR EPISODE THIS YR OF BLEEDING RELATED TO FIBROIDS.  Marland Kitchen Weakness    BOTH HANDS - S/P BILATERAL CARPAL TUNNEL RELEASE--BUT STILL HAS WEAKNESS--OFTEN DROPS THINGS   Review of Systems:  As per HPI.  Physical Exam:  Vitals:   09/25/20 1516  BP: (!) 151/72  Pulse: (!) 104  Temp: 99.1 F (37.3 C)  TempSrc: Oral  SpO2: 95%  Weight: (!) 316 lb 6.4 oz (143.5 kg)  Height: 5\' 3"  (1.6 m)   General: Pleasant, obese, sitting in wheelchair, no acute distress CV: Tachycardic, regular rhythm.  No murmurs, rubs, gallops. Pulm: Clear to auscultation bilaterally. MSK: L shoulder limited flexion, extension. Tinel, Phalen signs negative.  Assessment & Plan:   See Encounters Tab for problem based charting.  Patient seen with Dr. Dareen Piano

## 2020-09-25 NOTE — Assessment & Plan Note (Signed)
Patient says she has had chronic left shoulder pain, which has worsened over the past few months. Says she takes Tylenol regularly, up to 3,000mg  per day without relief. Mentions she had steroid injection previously but did not alleviate the pain after a few days.  A/P: -Patient has appointment with orthopedic physician tomorrow, will follow-up as needed

## 2020-09-26 ENCOUNTER — Ambulatory Visit (INDEPENDENT_AMBULATORY_CARE_PROVIDER_SITE_OTHER): Payer: Medicaid Other | Admitting: Specialist

## 2020-09-26 ENCOUNTER — Encounter: Payer: Self-pay | Admitting: Specialist

## 2020-09-26 VITALS — BP 154/80 | HR 85 | Ht 63.0 in | Wt 316.4 lb

## 2020-09-26 DIAGNOSIS — M4807 Spinal stenosis, lumbosacral region: Secondary | ICD-10-CM | POA: Diagnosis not present

## 2020-09-26 DIAGNOSIS — M4316 Spondylolisthesis, lumbar region: Secondary | ICD-10-CM | POA: Diagnosis not present

## 2020-09-26 DIAGNOSIS — M5136 Other intervertebral disc degeneration, lumbar region: Secondary | ICD-10-CM | POA: Diagnosis not present

## 2020-09-26 DIAGNOSIS — M48062 Spinal stenosis, lumbar region with neurogenic claudication: Secondary | ICD-10-CM

## 2020-09-26 LAB — TSH: TSH: 2.82 u[IU]/mL (ref 0.450–4.500)

## 2020-09-26 LAB — VITAMIN B12: Vitamin B-12: 888 pg/mL (ref 232–1245)

## 2020-09-26 NOTE — Progress Notes (Signed)
Office Visit Note   Patient: [name]           Date of Birth: [birth year]           MRN: [id number] Visit Date: 09/26/2020              Requested by: Gaylan Gerold, DO [address] [place],  [place] [address] PCP: [name], MD   Assessment & Plan: Visit Diagnoses:  1. Spondylolisthesis, lumbar region   2. Spinal stenosis of lumbar region without neurogenic claudication   3. Degenerative lumbar disc   4. Spinal stenosis of lumbosacral region     Plan: Avoid bending, stooping and avoid lifting weights greater than 10 lbs. Avoid prolong standing and walking. Order for a new walker with wheels. Surgery scheduling secretary Kandice Hams, will call you in the next week to schedule for surgery.  Surgery recommended is a one level lumbar fusion left L4-5  this would be done with rods, screws and cages with local bone graft and allograft (donor bone graft). Take hydrocodone for for pain. Risk of surgery includes risk of infection 1 in 100 patients, bleeding 1/2% chance you would need a transfusion.   Risk to the nerves is one in 10,000. You will need to use a brace for 3 months and wean from the brace on the 4th month. Expect improved walking and standing tolerance. Expect relief of leg pain but numbness may persist depending on the length and degree of pressure that has been present.  Follow-Up Instructions: No follow-ups on file.   Orders:  No orders of the defined types were placed in this encounter.  No orders of the defined types were placed in this encounter.     Procedures: No procedures performed   Clinical Data: Findings:  Narrative & Impression CLINICAL DATA:  Lumbosacral spinal stenosis. Low back pain radiating to the legs.  EXAM: LUMBAR MYELOGRAM  FLUOROSCOPY TIME:  Fluoroscopy Time: 2 minutes 19 seconds  Radiation Exposure Index: 781.24 microGray*m^2  PROCEDURE: After thorough discussion of risks and benefits of the  procedure including bleeding, infection, injury to nerves, blood vessels, adjacent structures as well as headache and CSF leak, written and oral informed consent was obtained. Consent was obtained by Dr. Logan Bores. Time out form was completed.  Patient was positioned prone on the fluoroscopy table. Local anesthesia was provided with 1% lidocaine without epinephrine after prepped and draped in the usual sterile fashion. Puncture was performed at L5-S1 using a 5 inch 22-gauge spinal needle via a right interlaminar approach. Using a single pass through the dura, the needle was placed within the thecal sac, with return of clear CSF. 15 mL of Isovue M-200 was injected into the thecal sac, with normal opacification of the nerve roots and cauda equina consistent with free flow within the subarachnoid space.  I personally performed the lumbar puncture and administered the intrathecal contrast. I also personally supervised acquisition of the myelogram images.  TECHNIQUE: Contiguous axial images were obtained through the Lumbar spine after the intrathecal infusion of infusion. Coronal and sagittal reconstructions were obtained of the axial image sets.  COMPARISON:  Lumbar spine MRI 05/22/2020  FINDINGS: LUMBAR MYELOGRAM FINDINGS:  There are 5 non rib-bearing lumbar type vertebrae. Vertebral alignment is normal in the prone position, however with standing there develops grade 1 anterolisthesis at L4-5 with a large ventral extradural defect resulting in severe spinal stenosis. This does not significantly change with flexion or extension. Minimal anterolisthesis of L5 on S1  develops with standing without associated stenosis. A small ventral extradural defect at L3-4 at most slightly increases with standing and contributes to mild spinal stenosis.  CT LUMBAR MYELOGRAM FINDINGS:  Vertebral alignment is normal on this supine CT. No fracture or suspicious osseous lesion is  identified. There is a small Schmorl's node involving the L2 superior endplate. There are prominent degenerative endplate changes at L97-Q7 including sclerosis and spurring with vacuum disc. The conus medullaris terminates at L1-2. There is abdominal aortic atherosclerosis without aneurysm.  T12-L1: Circumferential disc bulging, endplate spurring, and mild facet arthrosis result in mild spinal stenosis and mild bilateral neural foraminal stenosis, unchanged.  L1-2: Minimal disc bulging without stenosis, unchanged.  L2-3: Negative.  L3-4: Minimal disc bulging and mild facet hypertrophy result in borderline spinal stenosis without neural foraminal stenosis (mild spinal stenosis on standing radiographs).  L4-5: Disc bulging and severe bilateral facet arthrosis result in mild spinal stenosis and borderline to mild right and mild-to-moderate left neural foraminal stenosis (severe spinal stenosis on standing radiographs).  L5-S1: Severe bilateral facet arthrosis result in borderline bilateral neural foraminal stenosis. No disc herniation or spinal stenosis.  IMPRESSION: 1. Severe facet arthrosis at L4-5 and L5-S1 with grade 1 anterolisthesis developing at both levels with standing. 2. Severe spinal stenosis at L4-5 with standing. 3. Mild spinal stenosis at T12-L1 and L3-4. 4. Aortic Atherosclerosis (ICD10-I70.0).   Electronically Signed   By: Logan Bores M.D.   On: 08/29/2020 16:13   CLINICAL DATA: Lumbosacral spinal stenosis. Low back pain radiating to the legs.  EXAM: LUMBAR MYELOGRAM  FLUOROSCOPY TIME: Fluoroscopy Time: 2 minutes 19 seconds  Radiation Exposure Index: 781.24 microGray*m^2  PROCEDURE: After thorough discussion of risks and benefits of the procedure including bleeding, infection, injury to nerves, blood vessels, adjacent structures as well as headache and CSF leak, written and oral informed consent was obtained. Consent was obtained by  Dr. Logan Bores. Time out form was completed.  Patient was positioned prone on the fluoroscopy table. Local anesthesia was provided with 1% lidocaine without epinephrine after prepped and draped in the usual sterile fashion. Puncture was performed at L5-S1 using a 5 inch 22-gauge spinal needle via a right interlaminar approach. Using a single pass through the dura, the needle was placed within the thecal sac, with return of clear CSF. 15 mL of Isovue M-200 was injected into the thecal sac, with normal opacification of the nerve roots and cauda equina consistent with free flow within the subarachnoid space.  I personally performed the lumbar puncture and administered the intrathecal contrast. I also personally supervised acquisition of the myelogram images.  TECHNIQUE: Contiguous axial images were obtained through the Lumbar spine after the intrathecal infusion of infusion. Coronal and sagittal reconstructions were obtained of the axial image sets.  COMPARISON: Lumbar spine MRI 05/22/2020  FINDINGS: LUMBAR MYELOGRAM FINDINGS:  There are 5 non rib-bearing lumbar type vertebrae. Vertebral alignment is normal in the prone position, however with standing there develops grade 1 anterolisthesis at L4-5 with a large ventral extradural defect resulting in severe spinal stenosis. This does not significantly change with flexion or extension. Minimal anterolisthesis of L5 on S1 develops with standing without associated stenosis. A small ventral extradural defect at L3-4 at most slightly increases with standing and contributes to mild spinal stenosis.  CT LUMBAR MYELOGRAM FINDINGS:  Vertebral alignment is normal on this supine CT. No fracture or suspicious osseous lesion is identified. There is a small Schmorl's node involving the L2 superior endplate. There are prominent  degenerative endplate changes at P80-D9 including sclerosis and spurring with vacuum disc. The conus medullaris  terminates at L1-2. There is abdominal aortic atherosclerosis without aneurysm.  T12-L1: Circumferential disc bulging, endplate spurring, and mild facet arthrosis result in mild spinal stenosis and mild bilateral neural foraminal stenosis, unchanged.  L1-2: Minimal disc bulging without stenosis, unchanged.  L2-3: Negative.  L3-4: Minimal disc bulging and mild facet hypertrophy result in borderline spinal stenosis without neural foraminal stenosis (mild spinal stenosis on standing radiographs).  L4-5: Disc bulging and severe bilateral facet arthrosis result in mild spinal stenosis and borderline to mild right and mild-to-moderate left neural foraminal stenosis (severe spinal stenosis on standing radiographs).  L5-S1: Severe bilateral facet arthrosis result in borderline bilateral neural foraminal stenosis. No disc herniation or spinal stenosis.  IMPRESSION: 1. Severe facet arthrosis at L4-5 and L5-S1 with grade 1 anterolisthesis developing at both levels with standing. 2. Severe spinal stenosis at L4-5 with standing. 3. Mild spinal stenosis at T12-L1 and L3-4. 4. Aortic Atherosclerosis (ICD10-I70.0).   Electronically Signed By: Logan Bores M.D. On: 08/29/2020 16:13    Subjective: Chief Complaint  Patient presents with  . Lower Back - Follow-up    CT Myelogram review Lumbar spine    59 year old female with back pain with neurogenic claudication symptoms, leaning on furniture and items to be able to get from place to place walking. Pain is better with bending and sitting. No bowel or bladder difficulty, no more than normal. Taking for discomfort. Tried flexeril, hydrocodone and uses tylenol with no improvement with the tylenol. Hydrocodone is only temporary relief. Her pain is severe and on 1-10 the pain with standing and walking is a 7-8 and occasionally a "10". Sitting the pain is 4-5 with pressure sensation. Pain into both back and legs and buttocks is equal intensity.     Review of Systems  Constitutional: Negative.   HENT: Negative.   Eyes: Negative.   Respiratory: Negative.   Cardiovascular: Negative.   Gastrointestinal: Negative.   Endocrine: Negative.   Genitourinary: Negative.   Musculoskeletal: Negative.   Skin: Negative.   Allergic/Immunologic: Negative.   Neurological: Negative.   Hematological: Negative.   Psychiatric/Behavioral: Negative.      Objective: Vital Signs: BP (!) 154/80 (BP Location: Left Arm, Patient Position: Sitting)   Pulse 85   Ht 5\' 3"  (1.6 m)   Wt (!) 316 lb 6.4 oz (143.5 kg)   LMP 02/03/2012   BMI 56.05 kg/m   Physical Exam Constitutional:      Appearance: She is well-developed.  HENT:     Head: Normocephalic and atraumatic.  Eyes:     Pupils: Pupils are equal, round, and reactive to light.  Pulmonary:     Effort: Pulmonary effort is normal.     Breath sounds: Normal breath sounds.  Abdominal:     General: Bowel sounds are normal.     Palpations: Abdomen is soft.  Musculoskeletal:     Cervical back: Normal range of motion and neck supple.     Lumbar back: Negative right straight leg raise test and negative left straight leg raise test.  Skin:    General: Skin is warm and dry.  Neurological:     Mental Status: She is alert and oriented to person, place, and time.  Psychiatric:        Behavior: Behavior normal.        Thought Content: Thought content normal.        Judgment: Judgment normal.  Back Exam   Tenderness  The patient is experiencing tenderness in the lumbar.  Range of Motion  Extension: abnormal  Flexion: abnormal  Lateral bend right: abnormal  Lateral bend left: abnormal  Rotation right: abnormal  Rotation left: abnormal   Muscle Strength  Right Quadriceps:  5/5  Left Quadriceps:  5/5  Right Hamstrings:  5/5  Left Hamstrings:  5/5   Tests  Straight leg raise right: negative Straight leg raise left: negative  Reflexes  Patellar: 2/4 Achilles: 2/4 Babinski's  sign: normal   Other  Toe walk: normal Heel walk: normal Sensation: normal Gait: normal  Erythema: no back redness Scars: absent      Specialty Comments:  No specialty comments available.  Imaging: No results found.   PMFS History: Patient Active Problem List   Diagnosis Date Noted  . Neuropathy of forearm, right 09/25/2020  . Shoulder pain, left 09/25/2020  . Tick bite 04/06/2020  . Stiffness of hand joint 06/09/2019  . Right arm numbness 06/09/2019  . Low back pain 05/02/2019  . Pain of left hip joint 08/09/2018  . Malignant neoplasm of upper-outer quadrant of right breast in female, estrogen receptor positive ([place]) 06/10/2018  . Screening for colon cancer 03/12/2018  . Screening for breast cancer 03/12/2018  . Restless leg syndrome 02/04/2018  . 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 Depression 08/06/2015  . 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 Hypertension associated with diabetes ([place])   . Aortic stenosis   . Morbid obesity with body mass index of 60.0-69.9 in adult [facility])   . Chronic diastolic congestive heart failure ([place])   . COPD (chronic obstructive pulmonary disease) ([place]) 09/21/2013  . Obstructive sleep apnea 09/21/2013  . Nocturnal hypoxemia 09/12/2013  . Abnormal CT scan, chest 05/12/2013  . Hyperlipidemia 11/30/2012  . History of pulmonary embolus (PE) 07/09/2012  . Long term (current) use of anticoagulants 02/20/2012  . Normocytic anemia 02/08/2012  . Type 2 diabetes mellitus without complication, without long-term current use of insulin ([place]) 02/08/2012  . Asthma 02/08/2012  . GERD (gastroesophageal reflux disease)   . CAD (coronary artery disease) 12/08/2011   Past Medical History:  Diagnosis Date  . Anemia   . Aortic valve stenosis, severe   . Arthritis    PAIN AND SEVERE OA LEFT KNEE ; S/P RIGHT TKA ON 02/03/12; HAS LOWER BACK PAIN-UNABLE TO STAND MORE THAN 10 MIN; ARTHRITIS "ALL  OVER"  . Asthma   . Blood transfusion    2013[facility]  . Breast cancer in female [facility])    Right  . CAD (coronary artery disease)    Cath 2010 with DES x 1 RCA-- PT'S CARDIOLOGIST IS DR. [place]  . Chronic diastolic congestive heart failure ([place])   . COPD (chronic obstructive pulmonary disease) ([place])   . Depression   . Diabetes mellitus DIAGNOSED IN2010   Controll s with diet  . Eczema    on back  . Hyperlipidemia   . Hypertension   . Kidney stones   . Morbid obesity with body mass index of 60.0-69.9 in adult [facility])   . Myocardial infarction ([place])    PT THINKS SHE WAS DX WITH MI AT THE TIME OF HEART STENTING  . Pulmonary embolism ([place]) 02/08/12   S/P RT TOTAL KNEE ON 02/03/12--ON 02/08/12--DEVELOPED ACUTE SOB AND CHEST PAIN--AND DIAGNOSED WITH  PULMONARY EMBOLUS AND PNEUMONIA  . Restless leg syndrome   . Sleep apnea    uses 3 liters O2  at night   . Uterine fibroid    NO PROBLEMS AT PRESENT FROM THE FIBROIDS-STATES SHE IS POST MENOPAUSAL-LAST MENSES 2010 EXCEPT FOR EPISODE THIS YR OF BLEEDING RELATED TO FIBROIDS.  Marland Kitchen Weakness    BOTH HANDS - S/P BILATERAL CARPAL TUNNEL RELEASE--BUT STILL HAS WEAKNESS--OFTEN DROPS THINGS    Family History  Problem Relation Age of Onset  . Breast cancer Mother        stage IV at diagnosis  . Emphysema Mother        smoked  . Heart disease Mother   . COPD Father        smoked  . Asthma Father   . Heart disease Father   . Cancer Brother        Sinus    Past Surgical History:  Procedure Laterality Date  . BREAST LUMPECTOMY Right 06/2018  . BREAST LUMPECTOMY WITH RADIOACTIVE SEED AND SENTINEL LYMPH NODE BIOPSY Right 07/19/2018   Procedure: BREAST LUMPECTOMY WITH RADIOACTIVE SEED AND SENTINEL LYMPH NODE BIOPSY;  Surgeon: Alphonsa Overall, MD;  Location: [place];  Service: General;  Laterality: Right;  . CARDIAC CATHETERIZATION    . CARPAL TUNNEL RELEASE     Bilateral  . CHOLECYSTECTOMY    . CORONARY ANGIOPLASTY     2010 has stent in place  .  CYSTOSCOPY W/ RETROGRADES Right 09/21/2013   Procedure: CYSTOSCOPY WITH RIGHT RETROGRADE PYELOGRAM RIGHT DOUBLE J STENT ;  Surgeon: Fredricka Bonine, MD;  Location: [facility];  Service: Urology;  Laterality: Right;  . CYSTOSCOPY WITH URETEROSCOPY AND STENT PLACEMENT Right 10/25/2013   Procedure: CYSTOSCOPY RIGHT URETEROSCOPY HOLMIUM LASER LITHO AND STENT PLACEMENT;  Surgeon: Fredricka Bonine, MD;  Location: [facility];  Service: Urology;  Laterality: Right;  . HERNIA REPAIR    . INTRAOPERATIVE TRANSESOPHAGEAL ECHOCARDIOGRAM N/A 12/12/2014   Procedure: INTRAOPERATIVE TRANSESOPHAGEAL ECHOCARDIOGRAM;  Surgeon: Burnell Blanks, MD;  Location: [facility] OR;  Service: Cardiovascular;  Laterality: N/A;  . JOINT REPLACEMENT     bil total knees  . KNEE ARTHROPLASTY  02/03/2012   Procedure: COMPUTER ASSISTED TOTAL KNEE ARTHROPLASTY;  Surgeon: Mcarthur Rossetti, MD;  Location: [place];  Service: Orthopedics;  Laterality: Right;  Right total knee arthroplasty  . LEFT AND RIGHT HEART CATHETERIZATION WITH CORONARY ANGIOGRAM N/A 03/17/2013   Procedure: LEFT AND RIGHT HEART CATHETERIZATION WITH CORONARY ANGIOGRAM;  Surgeon: Burnell Blanks, MD;  Location: [facility] CATH LAB;  Service: Cardiovascular;  Laterality: N/A;  . LEFT AND RIGHT HEART CATHETERIZATION WITH CORONARY/GRAFT ANGIOGRAM N/A 09/14/2014   Procedure: LEFT AND RIGHT HEART CATHETERIZATION WITH Beatrix Fetters;  Surgeon: Burnell Blanks, MD;  Location: [facility] CATH LAB;  Service: Cardiovascular;  Laterality: N/A;  . TEE WITHOUT CARDIOVERSION N/A 03/14/2013   Procedure: TRANSESOPHAGEAL ECHOCARDIOGRAM (TEE);  Surgeon: Lelon Perla, MD;  Location: [facility] ENDOSCOPY;  Service: Cardiovascular;  Laterality: N/A;  . TEE WITHOUT CARDIOVERSION N/A 11/14/2014   Procedure: TRANSESOPHAGEAL ECHOCARDIOGRAM (TEE);  Surgeon: Lelon Perla, MD;  Location: [facility] ENDOSCOPY;  Service: Cardiovascular;  Laterality: N/A;  . TOTAL KNEE ARTHROPLASTY  09/10/2012    Procedure: TOTAL KNEE ARTHROPLASTY;  Surgeon: Mcarthur Rossetti, MD;  [facility];  Service: Orthopedics;  Laterality: Left;  . TOTAL KNEE REVISION Right 07/15/2013   Procedure: REVISION ARTHROPLASTY RIGHT KNEE;  Surgeon: Mcarthur Rossetti, MD;  Location[facility];  Service: Orthopedics;  Laterality: Right;  . TRANSCATHETER AORTIC VALVE REPLACEMENT, TRANSFEMORAL N/A 12/12/2014   Procedure: TRANSCATHETER AORTIC VALVE REPLACEMENT, TRANSFEMORAL;  Surgeon: Burnell Blanks,  MD;  Location: [place];  Service: Cardiovascular;  Laterality: N/A;  . TRIGGER FINGER RELEASE  09/10/2012   Procedure: RELEASE TRIGGER FINGER/A-1 PULLEY;  Surgeon: Mcarthur Rossetti, MD;  Location[facility];  Service: Orthopedics;  Laterality: Right;  Right Ring Finger  . TUBAL LIGATION     Social History   Occupational History  . Occupation: Disabled  Tobacco Use  . Smoking status: Former Smoker    Packs/day: 1.50    Years: 30.00    Pack years: 45.00    Types: Cigarettes    Quit date: 12/29/2000    Years since quitting: 19.7  . Smokeless tobacco: Never Used  Vaping Use  . Vaping Use: Never used  Substance and Sexual Activity  . Alcohol use: Yes    Comment: rarely  . Drug use: No  . Sexual activity: Not Currently    Birth control/protection: Surgical

## 2020-09-26 NOTE — Patient Instructions (Signed)
Avoid bending, stooping and avoid lifting weights greater than 10 lbs. Avoid prolong standing and walking. Order for a new walker with wheels. Surgery scheduling secretary Kandice Hams, will call you in the next week to schedule for surgery.  Surgery recommended is a one level lumbar fusion left L4-5  this would be done with rods, screws and cages with local bone graft and allograft (donor bone graft). Take hydrocodone for for pain. Risk of surgery includes risk of infection 1 in 200 patients, bleeding 1/2% chance you would need a transfusion.   Risk to the nerves is one in 10,000. You will need to use a brace for 3 months and wean from the brace on the 4th month. Expect improved walking and standing tolerance. Expect relief of leg pain but numbness may persist depending on the length and degree of pressure that has been present.

## 2020-09-27 ENCOUNTER — Encounter: Payer: Medicaid Other | Admitting: Student

## 2020-09-27 ENCOUNTER — Telehealth: Payer: Self-pay

## 2020-09-27 NOTE — Telephone Encounter (Signed)
Requesting to speak with a nurse about having left arm swollen from the flu shot.

## 2020-09-27 NOTE — Progress Notes (Signed)
Internal Medicine Clinic Attending  I saw and evaluated the patient.  I personally confirmed the key portions of the history and exam documented by Dr. Braswell and I reviewed pertinent patient test results.  The assessment, diagnosis, and plan were formulated together and I agree with the documentation in the resident's note.  

## 2020-09-27 NOTE — Telephone Encounter (Signed)
RTC, patient states she received a flu vaccine in clinic on 9/28 and the injection site is now swollen to the size of a golfball, hot to touch, and red.  Advised an appt for evaluation.  Appt made for today w/ PCP at 1315. SChaplin, RN,BSN

## 2020-09-28 DIAGNOSIS — Z419 Encounter for procedure for purposes other than remedying health state, unspecified: Secondary | ICD-10-CM | POA: Diagnosis not present

## 2020-10-01 ENCOUNTER — Other Ambulatory Visit: Payer: Self-pay | Admitting: Student in an Organized Health Care Education/Training Program

## 2020-10-01 DIAGNOSIS — J441 Chronic obstructive pulmonary disease with (acute) exacerbation: Secondary | ICD-10-CM

## 2020-10-08 ENCOUNTER — Other Ambulatory Visit: Payer: Self-pay | Admitting: Internal Medicine

## 2020-10-14 ENCOUNTER — Other Ambulatory Visit: Payer: Self-pay | Admitting: Internal Medicine

## 2020-10-14 DIAGNOSIS — J441 Chronic obstructive pulmonary disease with (acute) exacerbation: Secondary | ICD-10-CM

## 2020-10-15 ENCOUNTER — Telehealth: Payer: Self-pay | Admitting: *Deleted

## 2020-10-15 NOTE — Telephone Encounter (Signed)
   Primary Cardiologist: Lauree Chandler, MD  Chart reviewed as part of pre-operative protocol coverage. Because of [name] past medical history and time since last visit, she will require a follow-up visit in order to better assess preoperative cardiovascular risk.  Pre-op covering staff: - Please schedule appointment and call patient to inform them. If patient already had an upcoming appointment within acceptable timeframe, please add "pre-op clearance" to the appointment notes so provider is aware. - Please contact requesting surgeon's office via preferred method (i.e, phone, fax) to inform them of need for appointment prior to surgery.  If applicable, this message will also be routed to pharmacy pool and/or primary cardiologist for input on holding anticoagulant/antiplatelet agent as requested below so that this information is available to the clearing provider at time of patient's appointment.    Per Dr. Angelena Form, patient may hold ASA and plavix as needed before surgery.    [place], PA  10/15/2020, 2:12 PM

## 2020-10-15 NOTE — Telephone Encounter (Signed)
She can hold both ASA and Plavix for the procedure. Gerald Stabs

## 2020-10-15 NOTE — Telephone Encounter (Signed)
Appt scheduled for 10-20 tp will arrive early wearing a mask

## 2020-10-15 NOTE — Telephone Encounter (Signed)
   [facility] Pre-operative Risk Assessment    HEARTCARE STAFF: - Please ensure there is not already an duplicate clearance open for this procedure. - Under Visit Info/Reason for Call, type in Other and utilize the format Clearance MM/DD/YY or Clearance TBD. Do not use dashes or single digits. - If request is for dental extraction, please clarify the # of teeth to be extracted.  Request for surgical clearance:  1. What type of surgery is being performed? LUMBAR FUSION   2. When is this surgery scheduled? TBD   3. What type of clearance is required (medical clearance vs. Pharmacy clearance to hold med vs. Both)? MEDICAL  4. Are there any medications that need to be held prior to surgery and how long? ASA AND PLAVIX   5. Practice name and name of physician performing surgery? [facility]; DR. Jeneen Rinks NITKA   6. What is the office phone number? [phone]   7.   What is the office fax number? [place].   Anesthesia type (None, local, MAC, general) ? GENERAL   Julaine Hua 10/15/2020, 8:33 AM  _________________________________________________________________   (provider comments below)

## 2020-10-15 NOTE — Telephone Encounter (Signed)
Dr. Angelena Form We are asked to hold ASA and plavix for lumbar fusion. Pt is s/p DES to RCA 2010, cath in 2015 with moderate ostial RCA stenosis without intervention, and s/p TAVR in 2015. OK to hold plavix, request that ASA be continued?

## 2020-10-15 NOTE — Progress Notes (Signed)
CARDIOLOGY OFFICE NOTE  Date:  10/17/2020    [name] Date of Birth: 1961-06-02 Medical Record #[id number]  PCP:  [name], MD  Cardiologist:  [facility]  Chief Complaint  Patient presents with  . Pre-op Exam    History of Present Illness: [name] is a 59 y.o. female who presents today for a pre op clearance visit. She is seen for Dr. Angelena Form.   She has a history of severe aortic stenosis now s/p TAVR (2015), CAD, OSA, HTN, HLD, obesity, GERD, asthma, COPD, former tobacco abuse, and depression.   She had a cardiac cath in February 2010 and had a drug eluting stent (2.75m Xience) placed in the distal RCA. Her LAD and Circumflex were free of disease. She had her right knee replaced in February 25859 complicated by a PE. She was started on coumadin and treated for 6 months. CTA chest on 08/09/12 showed no evidence of PE so her coumadin was stopped. When seen in February 2014 and she c/o severe dyspnea with minimal exertion - felt that this was multi-factorial. Chest CTA with no PE. Stress myoview showed possible ischemia. Echo with severe AS, normal LV function. Cardiac cath on 03/17/13 with mild non-obstructive disease and patent stent RCA. She has continued to have severe dyspnea. Most recent cath 09/14/14 with 70% ostial RCA stenosis treated medically and no obstructive disease in the LAD or Circumflex. She underwent TAVR on 12/12/14 and did well. (26 mm Sapien 3 bioprosthetic valve).   She was last seen in January by Dr. MAngelena Form- felt to be doing well. Noted 60# weight loss over the past 3 years.   Comes in today. Here alone. Needing clearance for lumbar fusion. Not scheduled yet. Dr. MAngelena Formhas given the ok to hold aspirin and Plavix. She feels like she is doing ok since last here. She has limited mobility due to her back pain and obesity. Cannot walk around the block. Does not go up stairs. Moves around her house. Can't sweep or mop. She sits to  cook. Sits to wash dishes. Can't stand for very long. All due to her back pain. No chest pain noted. No syncope. She has had some falls however - no injury.  She does get short of breath with coming in. She feels better at rest. She is only using her Lasix about once a week due to urinary frequency and incontinence. She is still taking her potassium daily. No recent lipids - she is fasting today.  Her back is her biggest issue. Her weight is down about 8 more pounds and she is trying to eat a little less. Looks like Norvasc was added last month for better Bp control.   Past Medical History:  Diagnosis Date  . Anemia   . Aortic valve stenosis, severe   . 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 Asthma   . Blood transfusion    2013-[facility] . Breast cancer in female ([facility]    Right  . CAD (coronary artery disease)    Cath 2010 with DES x 1 RCA-- PT'S CARDIOLOGIST IS DR. MElberta . Chronic diastolic congestive heart failure [place]   . COPD [place]ch[place] obstructive pulmonary disease) ([place]   . Depression   . Diabetes mellitus DIAGNOSED IN2010   Controll s with diet  . Eczema  on back  . Hyperlipidemia   . Hypertension   . Kidney stones   . Morbid obesity with body mass index of 60.0-69.9 in adult [facility])   . Myocardial infarction ([place])    PT THINKS SHE WAS DX WITH MI AT THE TIME OF HEART STENTING  . Pulmonary embolism ([place]) 02/08/12   S/P RT TOTAL KNEE ON 02/03/12--ON 02/08/12--DEVELOPED ACUTE SOB AND CHEST PAIN--AND DIAGNOSED WITH  PULMONARY EMBOLUS AND PNEUMONIA  . Restless leg syndrome   . Sleep apnea    uses 3 liters O2 at night   . Uterine fibroid    NO PROBLEMS AT PRESENT FROM THE FIBROIDS-STATES SHE IS POST MENOPAUSAL-LAST MENSES 2010 EXCEPT FOR EPISODE THIS YR OF BLEEDING RELATED TO FIBROIDS.  Marland Kitchen Weakness    BOTH HANDS - S/P BILATERAL CARPAL TUNNEL RELEASE--BUT STILL HAS  WEAKNESS--OFTEN DROPS THINGS    Past Surgical History:  Procedure Laterality Date  . BREAST LUMPECTOMY Right 06/2018  . BREAST LUMPECTOMY WITH RADIOACTIVE SEED AND SENTINEL LYMPH NODE BIOPSY Right 07/19/2018   Procedure: BREAST LUMPECTOMY WITH RADIOACTIVE SEED AND SENTINEL LYMPH NODE BIOPSY;  Surgeon: Alphonsa Overall, MD;  Location: [place];  Service: General;  Laterality: Right;  . CARDIAC CATHETERIZATION    . CARPAL TUNNEL RELEASE     Bilateral  . CHOLECYSTECTOMY    . CORONARY ANGIOPLASTY     2010 has stent in place  . CYSTOSCOPY W/ RETROGRADES Right 09/21/2013   Procedure: CYSTOSCOPY WITH RIGHT RETROGRADE PYELOGRAM RIGHT DOUBLE J STENT ;  Surgeon: Fredricka Bonine, MD;  [facility];  Service: Urology;  Laterality: Right;  . CYSTOSCOPY WITH URETEROSCOPY AND STENT PLACEMENT Right 10/25/2013   Procedure: CYSTOSCOPY RIGHT URETEROSCOPY HOLMIUM LASER LITHO AND STENT PLACEMENT;  Surgeon: Fredricka Bonine, MD;  [facility];  Service: Urology;  Laterality: Right;  . HERNIA REPAIR    . INTRAOPERATIVE TRANSESOPHAGEAL ECHOCARDIOGRAM N/A 12/12/2014   Procedure: INTRAOPERATIVE TRANSESOPHAGEAL ECHOCARDIOGRAM;  Surgeon: Burnell Blanks, MD;  Location: [facility];  Service: Cardiovascular;  Laterality: N/A;  . JOINT REPLACEMENT     bil total knees  . KNEE ARTHROPLASTY  02/03/2012   Procedure: COMPUTER ASSISTED TOTAL KNEE ARTHROPLASTY;  Surgeon: Mcarthur Rossetti, MD;  Location: [place];  Service: Orthopedics;  Laterality: Right;  Right total knee arthroplasty  . LEFT AND RIGHT HEART CATHETERIZATION WITH CORONARY ANGIOGRAM N/A 03/17/2013   Procedure: LEFT AND RIGHT HEART CATHETERIZATION WITH CORONARY ANGIOGRAM;  Surgeon: Burnell Blanks, MD;  Location: [facility] CATH LAB;  Service: Cardiovascular;  Laterality: N/A;  . LEFT AND RIGHT HEART CATHETERIZATION WITH CORONARY/GRAFT ANGIOGRAM N/A 09/14/2014   Procedure: LEFT AND RIGHT HEART CATHETERIZATION WITH Beatrix Fetters;   Surgeon: Burnell Blanks, MD;  Location: [facility] CATH LAB;  Service: Cardiovascular;  Laterality: N/A;  . TEE WITHOUT CARDIOVERSION N/A 03/14/2013   Procedure: TRANSESOPHAGEAL ECHOCARDIOGRAM (TEE);  Surgeon: Lelon Perla, MD;  Location[facility] ENDOSCOPY;  Service: Cardiovascular;  Laterality: N/A;  . TEE WITHOUT CARDIOVERSION N/A 11/14/2014   Procedure: TRANSESOPHAGEAL ECHOCARDIOGRAM (TEE);  Surgeon: Lelon Perla, MD;  Location[facility] ENDOSCOPY;  Service: Cardiovascular;  Laterality: N/A;  . TOTAL KNEE ARTHROPLASTY  09/10/2012   Procedure: TOTAL KNEE ARTHROPLASTY;  Surgeon: Mcarthur Rossetti, MD;  [facility];  Service: Orthopedics;  Laterality: Left;  . TOTAL KNEE REVISION Right 07/15/2013   Procedure: REVISION ARTHROPLASTY RIGHT KNEE;  Surgeon: Mcarthur Rossetti, MD;  [facility];  Service: Orthopedics;  Laterality: Right;  . TRANSCATHETER AORTIC VALVE REPLACEMENT, TRANSFEMORAL N/A  12/12/2014   Procedure: TRANSCATHETER AORTIC VALVE REPLACEMENT, TRANSFEMORAL;  Surgeon: Burnell Blanks, MD;  Location: [place];  Service: Cardiovascular;  Laterality: N/A;  . TRIGGER FINGER RELEASE  09/10/2012   Procedure: RELEASE TRIGGER FINGER/A-1 PULLEY;  Surgeon: Mcarthur Rossetti, MD;  [facility];  Service: Orthopedics;  Laterality: Right;  Right Ring Finger  . TUBAL LIGATION       Medications: Current Meds  Medication Sig  . Accu-Chek FastClix Lancets MISC Check blood sugar two times a day  . albuterol (PROVENTIL) (2.5 MG/3ML) 0.083% nebulizer solution USE 1 VIAL IN NEBULIZER EVERY 4 HOURS AS NEEDED FOR WHEEZING OR SHORTNESS OF BREATH  . albuterol (VENTOLIN HFA) 108 (90 Base) MCG/ACT inhaler INHALE 2 PUFFS BY MOUTH EVERY 4 HOURS AS NEEDED FOR COUGH, WHEEZING, OR SHORTNESS OF BREATH  . amLODipine (NORVASC) 5 MG tablet Take 1 tablet (5 mg total) by mouth daily.  . ASPIRIN ADULT LOW STRENGTH 81 MG EC tablet TAKE 1 Tablet BY MOUTH ONCE DAILY  . atorvastatin (LIPITOR) 80 MG  tablet Take 1 tablet (80 mg total) by mouth daily.  . Blood Glucose Monitoring Suppl (ACCU-CHEK GUIDE) w/Device KIT 1 each by Does not apply route 2 (two) times daily.  . clopidogrel (PLAVIX) 75 MG tablet Take 1 tablet (75 mg total) by mouth every morning.  . Cyanocobalamin (VITAMIN B12 SL) Place 1 tablet under the tongue daily.  . DULoxetine (CYMBALTA) 30 MG capsule Take 1 capsule (30 mg total) by mouth 2 (two) times daily.  . Evolocumab (REPATHA SURECLICK) 003 MG/ML SOAJ Inject 1 pen into the skin every 14 (fourteen) days.  Marland Kitchen exemestane (AROMASIN) 25 MG tablet TAKE 1 TABLET BY MOUTH ONCE DAILY AFTER BREAKFAST  . ezetimibe (ZETIA) 10 MG tablet Take 1 tablet (10 mg total) by mouth daily.  . Fluticasone-Umeclidin-Vilant (TRELEGY ELLIPTA) 100-62.5-25 MCG/INH AEPB Inhale 1 puff into the lungs daily.  . furosemide (LASIX) 40 MG tablet Take 1 tablet (40 mg total) by mouth 2 (two) times daily.  Marland Kitchen gabapentin (NEURONTIN) 300 MG capsule TAKE 3 CAPSULES BY MOUTH DAILY AT BEDTIME  . glucose blood (ACCU-CHEK GUIDE) test strip Check blood sugar 2 times per day  . hydrochlorothiazide (HYDRODIURIL) 25 MG tablet Take 1 tablet (25 mg total) by mouth daily.  . hydrocortisone cream 1 % Apply 1 application topically daily as needed for itching.   Marland Kitchen ibuprofen (ADVIL,MOTRIN) 200 MG tablet Take 400 mg by mouth every 6 (six) hours as needed for moderate pain.  . Insulin Pen Needle (PEN NEEDLES) 32G X 4 MM MISC 1.8 mg by Does not apply route daily.  Marland Kitchen liraglutide (VICTOZA) 18 MG/3ML SOPN Inject 0.3 mLs (1.8 mg total) into the skin daily.  Marland Kitchen losartan (COZAAR) 100 MG tablet Take 1 tablet (100 mg total) by mouth daily.  . Melatonin 5 MG CAPS Take 1 capsule by mouth daily as needed (insomnia).  . metoprolol tartrate (LOPRESSOR) 25 MG tablet Take 1 tablet (25 mg total) by mouth 2 (two) times daily.  . nitroGLYCERIN (NITROSTAT) 0.4 MG SL tablet Place 1 tablet (0.4 mg total) under the tongue every 5 (five) minutes as needed for  chest pain.  . OXYGEN Inhale 3 L into the lungs as needed.  . potassium chloride SA (K-DUR,KLOR-CON) 20 MEQ tablet Take 2 tablets (40 mEq total) by mouth daily.  Marland Kitchen PROAIR HFA 108 (90 Base) MCG/ACT inhaler INHALE 2 PUFFS BY MOUTH EVERY 4 HOURS AS NEEDED FOR COUGH, WHEEZING, OR SHORTNESS OF BREATH  . TURMERIC  PO Take 1 tablet by mouth daily.  . VENTOLIN HFA 108 (90 Base) MCG/ACT inhaler Inhale 2 puffs into the lungs every 4 (four) hours as needed for wheezing or shortness of breath (or cough).  Marland Kitchen VITAMIN D PO Take 1 tablet by mouth daily.  Marland Kitchen VITAMIN E PO Take 1 tablet by mouth daily.     Allergies: No Known Allergies  Social History: The patient  reports that she quit smoking about 19 years ago. Her smoking use included cigarettes. She has a 45.00 pack-year smoking history. She has never used smokeless tobacco. She reports current alcohol use. She reports that she does not use drugs.   Family History: The patient's family history includes Asthma in her father; Breast cancer in her mother; COPD in her father; Cancer in her brother; Emphysema in her mother; Heart disease in her father and mother.   Review of Systems: Please see the history of present illness.   All other systems are reviewed and negative.   Physical Exam: VS:  Pulse 88   Ht 5' 2" (1.575 m)   Wt (!) 308 lb (139.7 kg)   LMP 02/03/2012   SpO2 95%   BMI 56.33 kg/m  .  BMI Body mass index is 56.33 kg/m.  Wt Readings from Last 3 Encounters:  10/17/20 (!) 308 lb (139.7 kg)  09/26/20 (!) 316 lb 6.4 oz (143.5 kg)  09/25/20 (!) 316 lb 6.4 oz (143.5 kg)    General: Alert and in no acute distress. She does get short of breath with ambulation.    Neck: Thick.   Cardiac: Regular rate and rhythm. No murmurs, rubs, or gallops. No significant edema.  Respiratory:  Lungs are clear to auscultation bilaterally with normal work of breathing.  GI: Soft and nontender.  MS: No deformity or atrophy. Gait and ROM intact. She is using a  cane. Seems painful for her to walk. She is limping.  Skin: Warm and dry. Color is normal.  Neuro:  Strength and sensation are intact and no gross focal deficits noted.  Psych: Alert, appropriate and with normal affect.   LABORATORY DATA:  EKG:  EKG is ordered today.  Personally reviewed by me. This demonstrates NSR - HR is 88. She has 1st degree AV block - this is unchanged.   Lab Results  Component Value Date   WBC 5.8 07/16/2020   HGB 12.6 07/16/2020   HCT 38.9 07/16/2020   PLT 212 07/16/2020   GLUCOSE 153 (H) 07/16/2020   CHOL 97 (L) 01/31/2019   TRIG 104 01/31/2019   HDL 50 01/31/2019   LDLDIRECT 162.0 10/17/2013   LDLCALC 26 01/31/2019   ALT 15 07/16/2020   AST 15 07/16/2020   NA 140 07/16/2020   K 4.4 07/16/2020   CL 103 07/16/2020   CREATININE 0.90 07/16/2020   BUN 14 07/16/2020   CO2 27 07/16/2020   TSH 2.820 09/25/2020   INR 1.02 08/07/2015   HGBA1C 6.8 (A) 09/25/2020     BNP (last 3 results) No results for input(s): BNP in the last 8760 hours.  ProBNP (last 3 results) No results for input(s): PROBNP in the last 8760 hours.   Other Studies Reviewed Today:  ECHO IMPRESSIONS 12/2019  1. Left ventricular ejection fraction, by visual estimation, is 60 to  65%. The left ventricle has normal function. There is no left ventricular  hypertrophy.  2. Left ventricular diastolic parameters are consistent with Grade I  diastolic dysfunction (impaired relaxation).  3. The left ventricle  has no regional wall motion abnormalities.  4. Global right ventricle has normal systolic function.The right  ventricular size is normal. No increase in right ventricular wall  thickness.  5. Left atrial size was normal.  6. Right atrial size was normal.  7. The mitral valve is grossly normal. No evidence of mitral valve  regurgitation. Moderate mitral stenosis.  8. The tricuspid valve is normal in structure.  9. The tricuspid valve is normal in structure. Tricuspid  valve  regurgitation is not demonstrated.  10. Aortic valve regurgitation is not visualized. No evidence of aortic  valve sclerosis or stenosis.  11. The pulmonic valve was normal in structure. Pulmonic valve  regurgitation is trivial.  12. The atrial septum is grossly normal.   In comparison     Cardiac cath 09/14/14: Left main: No obstructive disease.  Left Anterior Descending Artery: Large caliber vessel that courses to the apex. There is a moderate caliber diagonal branch. No obstructive disease noted.  Circumflex Artery: Large caliber vessel with two small obtuse marginal branches and a moderate caliber posterolateral branch. No obstructive disease noted.  Right Coronary Artery: Large dominant vessel with 70-80% ostial stenosis. 40% proximal stenosis. Distal stented segment is patent with no restenosis.  Left Ventricular Angiogram: Deferred.     Assessment and Plan:   1. Pre op clearance for lumbar fusion surgery - she is at increased risk due to not being able to complete over 4 mets of activity (due to back pain) and her other co-morbidities - she has no active chest pain reported. This is coming down to more of a quality of life issue for her.  I do not feel that further testing is warranted - she is not a great candidate for stress testing given her size with a most likely abnormal result to follow) - she has no active chest pain.  Will be available as needed. She has been given the ok to hold DAPT per Dr. Angelena Form.   She also notes that she has had prior PE/DVT following prior knee surgery many years ago - need to keep this in mind in the post op phase.   2. Prior severe AS - last echo was stable - EF is normal. Valve working ok - does have some mitral stenosis which is to follow.   3. CAD - last cath from 2015 - moderate ostial RCA stenosis and no disease in the LAD or LCX - no active chest pain. She is on aspirin/Plavix/beta blocker and statin - would continue.   4. HLD -  on statin - needs labs.   5. Morbid obesity - she is down 8 more pounds.   6. DM - per PCP.   7. HTN - has had Norvasc added - her BP looks good - would stay on current regimen.   Current medicines are reviewed with the patient today.  The patient does not have concerns regarding medicines other than what has been noted above.  The following changes have been made:  See above.  Labs/ tests ordered today include:    Orders Placed This Encounter  Procedures  . Basic metabolic panel  . CBC  . Lipid panel  . Hepatic function panel  . EKG 12-Lead     Disposition:   FU with Dr. Angelena Form in January with echo as planned. Will be available as needed. Labs here today.   Patient is agreeable to this plan and will call if any problems develop in the interim.   Signed:  Truitt Merle, NP  10/17/2020 9:28 AM  [place] [address] [place], [place]  [address] Phone: [phone] Fax: [phone]

## 2020-10-17 ENCOUNTER — Other Ambulatory Visit: Payer: Self-pay

## 2020-10-17 ENCOUNTER — Encounter: Payer: Self-pay | Admitting: Nurse Practitioner

## 2020-10-17 ENCOUNTER — Ambulatory Visit (INDEPENDENT_AMBULATORY_CARE_PROVIDER_SITE_OTHER): Payer: Medicaid Other | Admitting: Nurse Practitioner

## 2020-10-17 VITALS — BP 124/80 | HR 88 | Ht 62.0 in | Wt 308.0 lb

## 2020-10-17 DIAGNOSIS — I350 Nonrheumatic aortic (valve) stenosis: Secondary | ICD-10-CM

## 2020-10-17 DIAGNOSIS — Z01810 Encounter for preprocedural cardiovascular examination: Secondary | ICD-10-CM

## 2020-10-17 DIAGNOSIS — E7800 Pure hypercholesterolemia, unspecified: Secondary | ICD-10-CM

## 2020-10-17 DIAGNOSIS — I2510 Atherosclerotic heart disease of native coronary artery without angina pectoris: Secondary | ICD-10-CM

## 2020-10-17 LAB — BASIC METABOLIC PANEL
BUN/Creatinine Ratio: 15 (ref 12–28)
BUN: 13 mg/dL (ref 8–27)
CO2: 25 mmol/L (ref 20–29)
Calcium: 9.6 mg/dL (ref 8.7–10.3)
Chloride: 98 mmol/L (ref 96–106)
Creatinine, Ser: 0.89 mg/dL (ref 0.57–1.00)
GFR calc Af Amer: 81 mL/min/{1.73_m2} (ref >59)
GFR calc non Af Amer: 70 mL/min/{1.73_m2} (ref >59)
Glucose: 190 mg/dL — ABNORMAL HIGH (ref 65–99)
Potassium: 4 mmol/L (ref 3.5–5.2)
Sodium: 138 mmol/L (ref 134–144)

## 2020-10-17 LAB — LIPID PANEL
Chol/HDL Ratio: 2.6 ratio (ref 0.0–4.4)
Cholesterol, Total: 130 mg/dL (ref 100–199)
HDL: 50 mg/dL (ref >39)
LDL Chol Calc (NIH): 64 mg/dL (ref 0–99)
Triglycerides: 84 mg/dL (ref 0–149)
VLDL Cholesterol Cal: 16 mg/dL (ref 5–40)

## 2020-10-17 LAB — CBC
Hematocrit: 39.6 % (ref 34.0–46.6)
Hemoglobin: 13.5 g/dL (ref 11.1–15.9)
MCH: 29.4 pg (ref 26.6–33.0)
MCHC: 34.1 g/dL (ref 31.5–35.7)
MCV: 86 fL (ref 79–97)
Platelets: 243 10*3/uL (ref 150–450)
RBC: 4.59 x10E6/uL (ref 3.77–5.28)
RDW: 13.3 % (ref 11.7–15.4)
WBC: 7.5 10*3/uL (ref 3.4–10.8)

## 2020-10-17 LAB — HEPATIC FUNCTION PANEL
ALT: 14 IU/L (ref 0–32)
AST: 13 IU/L (ref 0–40)
Albumin: 4.2 g/dL (ref 3.8–4.8)
Alkaline Phosphatase: 78 IU/L (ref 44–121)
Bilirubin Total: 0.9 mg/dL (ref 0.0–1.2)
Bilirubin, Direct: 0.26 mg/dL (ref 0.00–0.40)
Total Protein: 6.5 g/dL (ref 6.0–8.5)

## 2020-10-17 NOTE — Patient Instructions (Addendum)
After Visit Summary:  We will be checking the following labs today - BMET, CBC, HPF and Lipids   Medication Instructions:    Continue with your current medicines.    If you need a refill on your cardiac medications before your next appointment, please call your pharmacy.     Testing/Procedures To Be Arranged:  N/A  Follow-Up:   See Dr. Angelena Form as planned     At [facility], you and your health needs are our priority.  As part of our continuing mission to provide you with exceptional heart care, we have created designated Provider Care Teams.  These Care Teams include your primary Cardiologist (physician) and Advanced Practice Providers (APPs -  Physician Assistants and Nurse Practitioners) who all work together to provide you with the care you need, when you need it.  Special Instructions:  . Stay safe, wash your hands for at least 20 seconds and wear a mask when needed.  . It was good to talk with you today.  . I will send a note over to Dr. Louanne Skye   Call the [place] office at [phone] if you have any questions, problems or concerns.

## 2020-10-23 ENCOUNTER — Encounter: Payer: Self-pay | Admitting: Internal Medicine

## 2020-10-23 ENCOUNTER — Ambulatory Visit (INDEPENDENT_AMBULATORY_CARE_PROVIDER_SITE_OTHER): Payer: Medicaid Other | Admitting: Internal Medicine

## 2020-10-23 ENCOUNTER — Other Ambulatory Visit: Payer: Self-pay

## 2020-10-23 DIAGNOSIS — M545 Low back pain, unspecified: Secondary | ICD-10-CM

## 2020-10-23 DIAGNOSIS — J441 Chronic obstructive pulmonary disease with (acute) exacerbation: Secondary | ICD-10-CM

## 2020-10-23 DIAGNOSIS — I152 Hypertension secondary to endocrine disorders: Secondary | ICD-10-CM

## 2020-10-23 DIAGNOSIS — Z86711 Personal history of pulmonary embolism: Secondary | ICD-10-CM | POA: Diagnosis not present

## 2020-10-23 DIAGNOSIS — E1159 Type 2 diabetes mellitus with other circulatory complications: Secondary | ICD-10-CM

## 2020-10-23 DIAGNOSIS — F3342 Major depressive disorder, recurrent, in full remission: Secondary | ICD-10-CM | POA: Diagnosis not present

## 2020-10-23 DIAGNOSIS — I350 Nonrheumatic aortic (valve) stenosis: Secondary | ICD-10-CM | POA: Diagnosis not present

## 2020-10-23 DIAGNOSIS — I5032 Chronic diastolic (congestive) heart failure: Secondary | ICD-10-CM

## 2020-10-23 MED ORDER — METOPROLOL SUCCINATE ER 50 MG PO TB24: 50.0000 mg | ORAL_TABLET | Freq: Every day | ORAL | 3 refills | Status: DC

## 2020-10-23 MED ORDER — FUROSEMIDE 40 MG PO TABS: 40.0000 mg | ORAL_TABLET | ORAL | 1 refills | Status: DC | PRN

## 2020-10-23 MED ORDER — DULOXETINE HCL 60 MG PO CPEP: 60.0000 mg | ORAL_CAPSULE | Freq: Every day | ORAL | 2 refills | Status: DC

## 2020-10-23 MED ORDER — AMLODIPINE-ATORVASTATIN 5-80 MG PO TABS: 1.0000 | ORAL_TABLET | Freq: Every day | ORAL | 3 refills | Status: AC

## 2020-10-23 MED ORDER — LOSARTAN POTASSIUM-HCTZ 100-25 MG PO TABS: 1.0000 | ORAL_TABLET | Freq: Every day | ORAL | 3 refills | Status: DC

## 2020-10-23 NOTE — Patient Instructions (Addendum)
Ms Korol,  It was a pleasure seeing you in clinic. Today we discussed:   Blood pressure: At this time, I will combine your medications:  Please start taking losartan-HCTZ combination pill once daily and amlodipine-atorvastatin combination pill once daily   Heart failure: Start taking Toprol XL 50mg  once daily and continue taking Furosemide 40mg  as needed if you notice 3lb weight gain in 24 hours or 5lb weight gain in 3 days or for lower extremity swelling  Depression: I have adjusted your dosing for cymbalta to 60mg  once daily tablet.   I hope this will help reduce your pill burden. I wish you the best for your back surgery. I encourage you to be as active as possible before and after your surgery. Please let us know if we can be of assistance  If you have any questions or concerns, please call our clinic at [phone] between 9am-5pm and after hours call [phone] and ask for the internal medicine resident on call. If you feel you are having a medical emergency please call 911.   Thank you, we look forward to helping you remain healthy!

## 2020-10-23 NOTE — Progress Notes (Signed)
   CC: hypertension f/u  HPI:  [name] is a 59 y.o. female with PMHx as listed below presenting for hypertension follow up and evaluation of pre-operative risk assessment for lumbar fusion surgery. Please see problem based charting for complete assessment and plan.  Past Medical History:  Diagnosis Date  . Anemia   . Aortic valve stenosis, severe   . 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 Asthma   . Blood transfusion    [facility]  . Breast cancer in female [facility])    Right  . CAD (coronary artery disease)    Cath 2010 with DES x 1 RCA-- PT'S CARDIOLOGIST IS DR. [place]  . Chronic diastolic congestive heart failure ([place])   . COPD (chronic obstructive pulmonary disease) ([place])   . Depression   . Diabetes mellitus DIAGNOSED IN2010   Controll s with diet  . Eczema    on back  . Hyperlipidemia   . Hypertension   . Kidney stones   . Morbid obesity with body mass index of 60.0-69.9 in adult [facility])   . Myocardial infarction ([place])    PT THINKS SHE WAS DX WITH MI AT THE TIME OF HEART STENTING  . Pulmonary embolism ([place]) 02/08/12   S/P RT TOTAL KNEE ON 02/03/12--ON 02/08/12--DEVELOPED ACUTE SOB AND CHEST PAIN--AND DIAGNOSED WITH  PULMONARY EMBOLUS AND PNEUMONIA  . Restless leg syndrome   . Sleep apnea    uses 3 liters O2 at night   . Uterine fibroid    NO PROBLEMS AT PRESENT FROM THE FIBROIDS-STATES SHE IS POST MENOPAUSAL-LAST MENSES 2010 EXCEPT FOR EPISODE THIS YR OF BLEEDING RELATED TO FIBROIDS.  Marland Kitchen Weakness    BOTH HANDS - S/P BILATERAL CARPAL TUNNEL RELEASE--BUT STILL HAS WEAKNESS--OFTEN DROPS THINGS   Review of Systems:  Negative except as stated in HPI.  Physical Exam:  Vitals:   10/23/20 1414  BP: (!) 123/53  Pulse: 86  Temp: 98.2 F (36.8 C)  TempSrc: Oral  SpO2: 94%  Weight: (!) 309 lb 9.6 oz (140.4 kg)  Height: 5\' 3"  (1.6 m)   Physical Exam    Constitutional: Appears well-developed and well-nourished. No distress.  HENT: Normocephalic and atraumatic, EOMI, conjunctiva normal, moist mucous membranes Cardiovascular: Normal rate, regular rhythm, S1 and S2 present, no murmurs, rubs, gallops.  Distal pulses intact Respiratory: No respiratory distress, no accessory muscle use.  Effort is normal.  Lungs are clear to auscultation bilaterally. GI: Nondistended, soft, nontender to palpation, normal active bowel sounds Musculoskeletal: Normal bulk and tone.  No peripheral edema noted. Neurological: Is alert and oriented x4, no apparent focal deficits noted. Skin: Warm and dry.  No rash, erythema, lesions noted. Psychiatric: Normal mood and affect. Behavior is normal. Judgment and thought content normal.    Assessment & Plan:   See Encounters Tab for problem based charting.  Patient discussed with [name]

## 2020-10-25 ENCOUNTER — Telehealth: Payer: Self-pay | Admitting: *Deleted

## 2020-10-25 ENCOUNTER — Other Ambulatory Visit: Payer: Self-pay | Admitting: Internal Medicine

## 2020-10-25 NOTE — Assessment & Plan Note (Signed)
Patient has a history of chronic lumbar pain in setting of degenerative changes. MRI of lumbar spine in May 2021 with spondylolisthesis at L4-S1 with severe chronic facet arthromathy with L4-5 foraminal stenosis; severe chronic disc degeneration at T12-L1 with mild spinal stenosis at conus without mass effect. She was previously managed with conservative therapy; however, notes that her persistent back pain is impairing with her quality of life.  She is presenting for preoperative assessment for lumbar fusion. Revised cardiac risk index with 15% 30-day perioperative risk of cardiac arrest/MI/death. Discussed with patient and recommendations sent to the neurosurgeon.  Plan: Optimize medical treatment for lumbar fusion

## 2020-10-25 NOTE — Assessment & Plan Note (Signed)
Patient is taking cymbalta 30mg  twice daily. Her symptoms are well controlled with this. At this time, will adjust dosing to 60mg  once daily to decrease pill burden.  Plan: - Duloxetine 60mg  once daily

## 2020-10-25 NOTE — Assessment & Plan Note (Addendum)
Most recent Echo in 12/2019 with LVEF 60-65% without any regional wall motion abnormalities, grade I diastolic dysfunction and moderate mitral valve stenosis noted. She follows with Dr Angelena Form, cardiologist. She denies any dyspnea on exertion or chest pain. She notes that she is taking metoprolol tartrate 25mg  bid and furosemide 40mg  as needed for lower extremity swelling. Patient appears euvolemic on examination.  Patient advised to take furosemide 40mg  daily as needed if noted to have 3lb weight gain over 24 hours or 5lb weight gain over 3 days or in setting of lower extremity swelling. She expresses understanding.   Plan:  Continue furosemide 40mg  as needed Will adjust Lopressor 25mg  bid to Toprol XR 50mg  daily to minimize pill burden

## 2020-10-25 NOTE — Assessment & Plan Note (Addendum)
Patient is presenting for follow up of her hypertension. At her last visit, she was noted to be hypertensive to 151/72 for which amlodipine 5mg  daily was added to her regimen. Patient denies any difficulty with taking this. She denies any headaches, vision changes, chest pain, palpitations, dizziness/lightheadedness, or focal weakness. BP today is 125/53 with HR 86.  She endorses large pill burden. In an attempt to decrease the pill burden, will attempt to consolidate.   Plan:  - Start on losartan-HCTZ 100-25mg  daily - Start amlodipine-atorvastatin 5-80mg  daily  - Switch Lopressor 25mg  bid to Toprol XL 50mg  daily  - F/u in 3 months for BP check

## 2020-10-25 NOTE — Telephone Encounter (Addendum)
Information was sent through CoverMyMeds for PA for Amlodipine-Atorvastatin.  Awaiting determination.  Sander Nephew, RN 10/25/2020 2:42 PM Additional information was sent through CoverMyMeds..  Awaiting determination.  Sander Nephew, RN 10/29/2020 2:43 PM. PA for Amlodipine-Atorvastatin approved 10/26/021 thru until further notice.  Sander Nephew, RN 10/29/2020 5:06 PM.

## 2020-10-25 NOTE — Progress Notes (Signed)
[facility] Medicine Clinic Attending ° °Case discussed with Dr. Aslam  At the time of the visit.  We reviewed the resident’s history and exam and pertinent patient test results.  I agree with the assessment, diagnosis, and plan of care documented in the resident’s note.  °

## 2020-10-25 NOTE — Assessment & Plan Note (Signed)
Patient is on Trelegy 1 puff daily and notes that this is working well for her. She notes that she is only using oxygen as needed with exertion. Otherwise, she does not have any acute concerns. SpO2 at this visit is 94% on room air.   Plan:  Continue oxygen therapy as needed  Continu Trelegy one puff daily

## 2020-10-25 NOTE — Assessment & Plan Note (Signed)
Patient has history of subsegmental PE in 2013 in setting of knee replacement resulting in immobility. She completed 6 months of Coumadin. In setting of upcoming lumbar fusion surgery, would have to monitor closely for this. Patient is not currently on anticoagulation

## 2020-10-29 DIAGNOSIS — Z419 Encounter for procedure for purposes other than remedying health state, unspecified: Secondary | ICD-10-CM | POA: Diagnosis not present

## 2020-11-01 ENCOUNTER — Encounter: Payer: Self-pay | Admitting: Specialist

## 2020-11-01 ENCOUNTER — Other Ambulatory Visit: Payer: Self-pay

## 2020-11-01 ENCOUNTER — Ambulatory Visit (INDEPENDENT_AMBULATORY_CARE_PROVIDER_SITE_OTHER): Payer: Medicaid Other | Admitting: Specialist

## 2020-11-01 VITALS — BP 147/79 | HR 86 | Ht 63.0 in | Wt 309.7 lb

## 2020-11-01 DIAGNOSIS — M4316 Spondylolisthesis, lumbar region: Secondary | ICD-10-CM | POA: Diagnosis not present

## 2020-11-01 DIAGNOSIS — M4807 Spinal stenosis, lumbosacral region: Secondary | ICD-10-CM | POA: Diagnosis not present

## 2020-11-01 DIAGNOSIS — M7542 Impingement syndrome of left shoulder: Secondary | ICD-10-CM

## 2020-11-01 DIAGNOSIS — M5136 Other intervertebral disc degeneration, lumbar region: Secondary | ICD-10-CM | POA: Diagnosis not present

## 2020-11-01 DIAGNOSIS — M51369 Other intervertebral disc degeneration, lumbar region without mention of lumbar back pain or lower extremity pain: Secondary | ICD-10-CM

## 2020-11-01 DIAGNOSIS — M48062 Spinal stenosis, lumbar region with neurogenic claudication: Secondary | ICD-10-CM | POA: Diagnosis not present

## 2020-11-01 NOTE — Progress Notes (Signed)
Office Visit Note   Patient: [name]           Date of Birth: [birth year]           MRN: [id number] Visit Date: 11/01/2020              Requested by: [name], MD [address] [place],  [place] [address] PCP: [name], MD   Assessment & Plan: Visit Diagnoses:  1. Degenerative lumbar disc   2. Spinal stenosis of lumbar region with neurogenic claudication   3. Spondylolisthesis of lumbar region   4. Spinal stenosis of lumbosacral region   5. Impingement syndrome of left shoulder     Plan:Plan: Avoid bending, stooping and avoid lifting weights greater than 10 lbs. Avoid prolong standing and walking. Order for a new walker with wheels. Surgery scheduling secretary Kandice Hams, will call you in the next week to schedule for surgery.  Surgery recommended is a one level lumbar fusion left L4-5  this would be done with rods, screws and cages with local bone graft and allograft (donor bone graft). Take hydrocodone for for pain. Risk of surgery includes risk of infection 1 in 100 patients, bleeding 1/2% chance you would need a transfusion.   Risk to the nerves is one in 10,000. You will need to use a brace for 3 months and wean from the brace on the 4th month. Expect improved walking and standing tolerance. Expect relief of leg pain but numbness may persist depending on the length and degree of pressure that has been present.  Follow-Up Instructions: No follow-ups on file.   Orders:  No orders of the defined types were placed in this encounter.  No orders of the defined types were placed in this encounter.   Follow-Up Instructions: No follow-ups on file.   Orders:  No orders of the defined types were placed in this encounter.  No orders of the defined types were placed in this encounter.     Procedures: No procedures performed   Clinical Data: No additional findings.   Subjective: Chief Complaint  Patient presents with  . Lower Back  - Follow-up    59 year old female with neuroclaudication, walking from waiting area to hear with some SOB. Once she sits she is fine. Using a rubber band with good strengthening of the legs. No bowel or bladder difficulty. Pain improves with sitting.  She has been to cardiology and Dr. Suezanne Cheshire has cleared her for surgery.     Review of Systems  Constitutional: Negative.   HENT: Negative.   Eyes: Negative.   Respiratory: Negative.   Cardiovascular: Negative.   Gastrointestinal: Negative.   Endocrine: Negative.   Genitourinary: Negative.   Musculoskeletal: Negative.   Skin: Negative.   Allergic/Immunologic: Negative.   Neurological: Negative.   Hematological: Negative.   Psychiatric/Behavioral: Negative.      Objective: Vital Signs: BP (!) 147/79 (BP Location: Left Arm, Patient Position: Sitting)   Pulse 86   Ht 5\' 3"  (1.6 m)   Wt (!) 309 lb 11.2 oz (140.5 kg)   LMP 02/03/2012   BMI 54.86 kg/m   Physical Exam Constitutional:      Appearance: She is well-developed.  HENT:     Head: Normocephalic and atraumatic.  Eyes:     Pupils: Pupils are equal, round, and reactive to light.  Pulmonary:     Effort: Pulmonary effort is normal.     Breath sounds: Normal breath sounds.  Abdominal:  General: Bowel sounds are normal.     Palpations: Abdomen is soft.  Musculoskeletal:     Cervical back: Normal range of motion and neck supple.     Lumbar back: Negative right straight leg raise test and negative left straight leg raise test.  Skin:    General: Skin is warm and dry.  Neurological:     Mental Status: She is alert and oriented to person, place, and time.  Psychiatric:        Behavior: Behavior normal.        Thought Content: Thought content normal.        Judgment: Judgment normal.     Back Exam   Tenderness  The patient is experiencing tenderness in the lumbar.  Range of Motion  Extension: abnormal  Flexion: normal  Lateral bend right: normal  Lateral  bend left: normal  Rotation right: normal  Rotation left: normal   Muscle Strength  Right Quadriceps:  5/5  Left Quadriceps:  5/5  Right Hamstrings:  5/5  Left Hamstrings:  5/5   Tests  Straight leg raise right: negative Straight leg raise left: negative  Reflexes  Patellar: 1/4 Achilles: 1/4 Biceps: 1/4  Other  Toe walk: normal Heel walk: normal Sensation: normal      Specialty Comments:  No specialty comments available.  Imaging: No results found.   PMFS History: Patient Active Problem List   Diagnosis Date Noted  . Neuropathy of forearm, right 09/25/2020  . Shoulder pain, left 09/25/2020  . Tick bite 04/06/2020  . Stiffness of hand joint 06/09/2019  . Right arm numbness 06/09/2019  . Low back pain 05/02/2019  . Pain of left hip joint 08/09/2018  . Malignant neoplasm of upper-outer quadrant of right breast in female, estrogen receptor positive ([place]) 06/10/2018  . Screening for colon cancer 03/12/2018  . Screening for breast cancer 03/12/2018  . Restless leg syndrome 02/04/2018  . 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 Depression 08/06/2015  . Hypertension associated with diabetes ([place])   . Aortic stenosis   . Chronic diastolic congestive heart failure ([place])   . COPD (chronic obstructive pulmonary disease) ([place]) 09/21/2013  . Obstructive sleep apnea 09/21/2013  . Nocturnal hypoxemia 09/12/2013  . Abnormal CT scan, chest 05/12/2013  . Hyperlipidemia 11/30/2012  . History of pulmonary embolus (PE) 07/09/2012  . Normocytic anemia 02/08/2012  . Type 2 diabetes mellitus without complication, without long-term current use of insulin ([place]) 02/08/2012  . Asthma 02/08/2012  . GERD (gastroesophageal reflux disease)   . CAD (coronary artery disease) 12/08/2011   Past Medical History:  Diagnosis Date  . Anemia   . Aortic valve stenosis, severe   . Arthritis    PAIN AND SEVERE OA LEFT KNEE ; S/P RIGHT TKA ON 02/03/12; HAS LOWER BACK PAIN-UNABLE  TO STAND MORE THAN 10 MIN; ARTHRITIS "ALL OVER"  . Asthma   . Blood transfusion    [facility]  . Breast cancer in female [facility])    Right  . CAD (coronary artery disease)    Cath 2010 with DES x 1 RCA-- PT'S CARDIOLOGIST IS DR. Matlock  . Chronic diastolic congestive heart failure ([place])   . COPD (chronic obstructive pulmonary disease) ([place])   . Depression   . Diabetes mellitus DIAGNOSED IN2010   Controll s with diet  . Eczema    on back  . Hyperlipidemia   . Hypertension   . Kidney stones   . Morbid obesity with body mass index of 60.0-69.9 in  adult [facility])   . Myocardial infarction ([place])    PT THINKS SHE WAS DX WITH MI AT THE TIME OF HEART STENTING  . Pulmonary embolism ([place]) 02/08/12   S/P RT TOTAL KNEE ON 02/03/12--ON 02/08/12--DEVELOPED ACUTE SOB AND CHEST PAIN--AND DIAGNOSED WITH  PULMONARY EMBOLUS AND PNEUMONIA  . Restless leg syndrome   . Sleep apnea    uses 3 liters O2 at night   . Uterine fibroid    NO PROBLEMS AT PRESENT FROM THE FIBROIDS-STATES SHE IS POST MENOPAUSAL-LAST MENSES 2010 EXCEPT FOR EPISODE THIS YR OF BLEEDING RELATED TO FIBROIDS.  Marland Kitchen Weakness    BOTH HANDS - S/P BILATERAL CARPAL TUNNEL RELEASE--BUT STILL HAS WEAKNESS--OFTEN DROPS THINGS    Family History  Problem Relation Age of Onset  . Breast cancer Mother        stage IV at diagnosis  . Emphysema Mother        smoked  . Heart disease Mother   . COPD Father        smoked  . Asthma Father   . Heart disease Father   . Cancer Brother        Sinus    Past Surgical History:  Procedure Laterality Date  . BREAST LUMPECTOMY Right 06/2018  . BREAST LUMPECTOMY WITH RADIOACTIVE SEED AND SENTINEL LYMPH NODE BIOPSY Right 07/19/2018   Procedure: BREAST LUMPECTOMY WITH RADIOACTIVE SEED AND SENTINEL LYMPH NODE BIOPSY;  Surgeon: Alphonsa Overall, MD;  Location: [facility];  Service: General;  Laterality: Right;  . CARDIAC CATHETERIZATION    . CARPAL TUNNEL RELEASE     Bilateral  . CHOLECYSTECTOMY    . CORONARY ANGIOPLASTY      2010 has stent in place  . CYSTOSCOPY W/ RETROGRADES Right 09/21/2013   Procedure: CYSTOSCOPY WITH RIGHT RETROGRADE PYELOGRAM RIGHT DOUBLE J STENT ;  Surgeon: Fredricka Bonine, MD;  Location: [facility];  Service: Urology;  Laterality: Right;  . CYSTOSCOPY WITH URETEROSCOPY AND STENT PLACEMENT Right 10/25/2013   Procedure: CYSTOSCOPY RIGHT URETEROSCOPY HOLMIUM LASER LITHO AND STENT PLACEMENT;  Surgeon: Fredricka Bonine, MD;  Location: [facility];  Service: Urology;  Laterality: Right;  . HERNIA REPAIR    . INTRAOPERATIVE TRANSESOPHAGEAL ECHOCARDIOGRAM N/A 12/12/2014   Procedure: INTRAOPERATIVE TRANSESOPHAGEAL ECHOCARDIOGRAM;  Surgeon: Burnell Blanks, MD;  Location: [facility] OR;  Service: Cardiovascular;  Laterality: N/A;  . JOINT REPLACEMENT     bil total knees  . KNEE ARTHROPLASTY  02/03/2012   Procedure: COMPUTER ASSISTED TOTAL KNEE ARTHROPLASTY;  Surgeon: Mcarthur Rossetti, MD;  Location: [facility];  Service: Orthopedics;  Laterality: Right;  Right total knee arthroplasty  . LEFT AND RIGHT HEART CATHETERIZATION WITH CORONARY ANGIOGRAM N/A 03/17/2013   Procedure: LEFT AND RIGHT HEART CATHETERIZATION WITH CORONARY ANGIOGRAM;  Surgeon: Burnell Blanks, MD;  [facility] CATH LAB;  Service: Cardiovascular;  Laterality: N/A;  . LEFT AND RIGHT HEART CATHETERIZATION WITH CORONARY/GRAFT ANGIOGRAM N/A 09/14/2014   Procedure: LEFT AND RIGHT HEART CATHETERIZATION WITH Beatrix Fetters;  Surgeon: Burnell Blanks, MD;  [facility];  Service: Cardiovascular;  Laterality: N/A;  . TEE WITHOUT CARDIOVERSION N/A 03/14/2013   Procedure: TRANSESOPHAGEAL ECHOCARDIOGRAM (TEE);  Surgeon: Lelon Perla, MD;  [facility] ENDOSCOPY;  Service: Cardiovascular;  Laterality: N/A;  . TEE WITHOUT CARDIOVERSION N/A 11/14/2014   Procedure: TRANSESOPHAGEAL ECHOCARDIOGRAM (TEE);  Surgeon: Lelon Perla, MD;  Location: [facility];  Service: Cardiovascular;  Laterality: N/A;  .  TOTAL KNEE ARTHROPLASTY  09/10/2012   Procedure: TOTAL KNEE ARTHROPLASTY;  Surgeon: Mcarthur Rossetti, MD;  [facility];  Service: Orthopedics;  Laterality: Left;  . TOTAL KNEE REVISION Right 07/15/2013   Procedure: REVISION ARTHROPLASTY RIGHT KNEE;  Surgeon: Mcarthur Rossetti, MD;  [facility];  Service: Orthopedics;  Laterality: Right;  . TRANSCATHETER AORTIC VALVE REPLACEMENT, TRANSFEMORAL N/A 12/12/2014   Procedure: TRANSCATHETER AORTIC VALVE REPLACEMENT, TRANSFEMORAL;  Surgeon: Burnell Blanks, MD;  Location: [facility];  Service: Cardiovascular;  Laterality: N/A;  . TRIGGER FINGER RELEASE  09/10/2012   Procedure: RELEASE TRIGGER FINGER/A-1 PULLEY;  Surgeon: Mcarthur Rossetti, MD;  Location[facility];  Service: Orthopedics;  Laterality: Right;  Right Ring Finger  . TUBAL LIGATION     Social History   Occupational History  . Occupation: Disabled  Tobacco Use  . Smoking status: Former Smoker    Packs/day: 1.50    Years: 30.00    Pack years: 45.00    Types: Cigarettes    Quit date: 12/29/2000    Years since quitting: 19.8  . Smokeless tobacco: Never Used  Vaping Use  . Vaping Use: Never used  Substance and Sexual Activity  . Alcohol use: Yes    Comment: rarely  . Drug use: No  . Sexual activity: Not Currently    Birth control/protection: Surgical

## 2020-11-06 ENCOUNTER — Other Ambulatory Visit: Payer: Self-pay | Admitting: Student in an Organized Health Care Education/Training Program

## 2020-11-06 DIAGNOSIS — J441 Chronic obstructive pulmonary disease with (acute) exacerbation: Secondary | ICD-10-CM

## 2020-11-19 ENCOUNTER — Other Ambulatory Visit: Payer: Self-pay

## 2020-11-27 NOTE — Progress Notes (Addendum)
Your procedure is scheduled on Monday, December 03, 2020.  Report to [facility] Main Entrance "A" at 5:30 A.M., and check in at the Admitting office.  Call this number if you have problems the morning of surgery:  [phone]  Call [phone] if you have any questions prior to your surgery date Monday-Friday 8am-4pm    Remember:  Do not eat after midnight the night before your surgery  You may drink clear liquids until 4:30 AM the morning of your surgery.   Clear liquids allowed are: Water, Non-Citrus Juices (without pulp), Carbonated Beverages, Clear Tea, Black Coffee Only, and Gatorade   Enhanced Recovery after Surgery for Orthopedics Enhanced Recovery after Surgery is a protocol used to improve the stress on your body and your recovery after surgery.  Patient Instructions  . The day of surgery (if you have diabetes):  o Drink ONE 10oz bottle water by 4:30 AM the morning of surgery o This drink was given to you during your hospital  pre-op appointment visit.  o Color of the Gatorade may vary. Red is not allowed. o Nothing else to drink after completing the  Gatorade 2 (G2). DO NOT SIP.         If you have questions, please contact your surgeon's office.     Take these medicines the morning of surgery with A SIP OF WATER:  amlodipine-atorvastatin (CADUET) DULoxetine (CYMBALTA) exemestane (AROMASIN) Fluticasone-Umeclidin-Vilant (TRELEGY ELLIPTA)  metoprolol succinate (TOPROL XL)   IF NEEDED:     acetaminophen (TYLENOL) albuterol (VENTOLIN HFA)  Follow your surgeon's instructions on when to stop clopidogrel (PLAVIX).  If no instructions were given by your surgeon then you will need to call the office to get those instructions.    As of today, STOP taking any Aspirin (unless otherwise instructed by your surgeon) Aleve, Naproxen, Ibuprofen, Motrin, Advil, Goody's, BC's, all herbal medications, fish oil, and all vitamins.    WHAT DO I DO ABOUT MY DIABETES  MEDICATION?   Marland Kitchen The day of surgery, do not take other diabetes injectables, including Victoza (liraglutide).   HOW TO MANAGE YOUR DIABETES BEFORE AND AFTER SURGERY  Why is it important to control my blood sugar before and after surgery? . Improving blood sugar levels before and after surgery helps healing and can limit problems. . A way of improving blood sugar control is eating a healthy diet by: o  Eating less sugar and carbohydrates o  Increasing activity/exercise o  Talking with your doctor about reaching your blood sugar goals . High blood sugars (greater than 180 mg/dL) can raise your risk of infections and slow your recovery, so you will need to focus on controlling your diabetes during the weeks before surgery. . Make sure that the doctor who takes care of your diabetes knows about your planned surgery including the date and location.  How do I manage my blood sugar before surgery? . Check your blood sugar at least 4 times a day, starting 2 days before surgery, to make sure that the level is not too high or low. . Check your blood sugar the morning of your surgery when you wake up and every 2 hours until you get to the Short Stay unit. o If your blood sugar is less than 70 mg/dL, you will need to treat for low blood sugar: - Do not take insulin. - Treat a low blood sugar (less than 70 mg/dL) with  cup of clear juice (cranberry or apple), 4 glucose tablets, OR glucose gel. - Recheck  blood sugar in 15 minutes after treatment (to make sure it is greater than 70 mg/dL). If your blood sugar is not greater than 70 mg/dL on recheck, call [phone] for further instructions. . Report your blood sugar to the short stay nurse when you get to Short Stay.  . If you are admitted to the hospital after surgery: o Your blood sugar will be checked by the staff and you will probably be given insulin after surgery (instead of oral diabetes medicines) to make sure you have good blood sugar  levels. o The goal for blood sugar control after surgery is 80-180 mg/dL.                      Do not wear jewelry, make up, or nail polish            Do not wear lotions, powders, perfumes, or deodorant.            Do not shave 48 hours prior to surgery.            Do not bring valuables to the hospital.            [facility] is not responsible for any belongings or valuables.  Do NOT Smoke (Tobacco/Vaping) or drink Alcohol 24 hours prior to your procedure If you use a CPAP at night, you may bring all equipment for your overnight stay.   Contacts, glasses, dentures or bridgework may not be worn into surgery.      For patients admitted to the hospital, discharge time will be determined by your treatment team.   Patients discharged the day of surgery will not be allowed to drive home, and someone needs to stay with them for 24 hours.    Special instructions:   [place]- Preparing For Surgery  Before surgery, you can play an important role. Because skin is not sterile, your skin needs to be as free of germs as possible. You can reduce the number of germs on your skin by washing with CHG (chlorahexidine gluconate) Soap before surgery.  CHG is an antiseptic cleaner which kills germs and bonds with the skin to continue killing germs even after washing.    Oral Hygiene is also important to reduce your risk of infection.  Remember - BRUSH YOUR TEETH THE MORNING OF SURGERY WITH YOUR REGULAR TOOTHPASTE  Please do not use if you have an allergy to CHG or antibacterial soaps. If your skin becomes reddened/irritated stop using the CHG.  Do not shave (including legs and underarms) for at least 48 hours prior to first CHG shower. It is OK to shave your face.  Please follow these instructions carefully.   1. Shower the NIGHT BEFORE SURGERY and the MORNING OF SURGERY with CHG Soap.   2. If you chose to wash your hair, wash your hair first as usual with your normal shampoo.  3. After you  shampoo, rinse your hair and body thoroughly to remove the shampoo.  4. Use CHG as you would any other liquid soap. You can apply CHG directly to the skin and wash gently with a scrungie or a clean washcloth.   5. Apply the CHG Soap to your body ONLY FROM THE NECK DOWN.  Do not use on open wounds or open sores. Avoid contact with your eyes, ears, mouth and genitals (private parts). Wash Face and genitals (private parts)  with your normal soap.   6. Wash thoroughly, paying special attention to the area  where your surgery will be performed.  7. Thoroughly rinse your body with warm water from the neck down.  8. DO NOT shower/wash with your normal soap after using and rinsing off the CHG Soap.  9. Pat yourself dry with a CLEAN TOWEL.  10. Wear CLEAN PAJAMAS to bed the night before surgery  11. Place CLEAN SHEETS on your bed the night of your first shower and DO NOT SLEEP WITH PETS.   Day of Surgery: Wear Clean/Comfortable clothing the morning of surgery Do not apply any deodorants/lotions.   Remember to brush your teeth WITH YOUR REGULAR TOOTHPASTE.   Please read over the following fact sheets that you were given.

## 2020-11-28 ENCOUNTER — Ambulatory Visit (HOSPITAL_COMMUNITY)
Admission: RE | Admit: 2020-11-28 | Discharge: 2020-11-28 | Disposition: A | Discharge status: Home / Self Care | Payer: Medicaid Other | Source: Ambulatory Visit | Attending: Surgery | Admitting: Surgery

## 2020-11-28 ENCOUNTER — Telehealth: Payer: Self-pay

## 2020-11-28 ENCOUNTER — Encounter (HOSPITAL_COMMUNITY): Payer: Self-pay

## 2020-11-28 ENCOUNTER — Other Ambulatory Visit: Payer: Self-pay

## 2020-11-28 ENCOUNTER — Ambulatory Visit (INDEPENDENT_AMBULATORY_CARE_PROVIDER_SITE_OTHER): Payer: Medicaid Other | Admitting: Surgery

## 2020-11-28 ENCOUNTER — Encounter (HOSPITAL_COMMUNITY)
Admission: RE | Admit: 2020-11-28 | Discharge: 2020-11-28 | Disposition: A | Discharge status: Home / Self Care | Payer: Medicaid Other | Source: Ambulatory Visit | Attending: Specialist | Admitting: Specialist

## 2020-11-28 ENCOUNTER — Encounter: Payer: Self-pay | Admitting: Surgery

## 2020-11-28 VITALS — BP 155/77 | HR 97 | Ht 63.0 in | Wt 310.6 lb

## 2020-11-28 DIAGNOSIS — E119 Type 2 diabetes mellitus without complications: Secondary | ICD-10-CM | POA: Diagnosis not present

## 2020-11-28 DIAGNOSIS — M4316 Spondylolisthesis, lumbar region: Secondary | ICD-10-CM

## 2020-11-28 DIAGNOSIS — Z01818 Encounter for other preprocedural examination: Secondary | ICD-10-CM | POA: Insufficient documentation

## 2020-11-28 DIAGNOSIS — Z87891 Personal history of nicotine dependence: Secondary | ICD-10-CM | POA: Diagnosis not present

## 2020-11-28 DIAGNOSIS — Z86711 Personal history of pulmonary embolism: Secondary | ICD-10-CM | POA: Diagnosis not present

## 2020-11-28 DIAGNOSIS — E669 Obesity, unspecified: Secondary | ICD-10-CM | POA: Insufficient documentation

## 2020-11-28 DIAGNOSIS — G4733 Obstructive sleep apnea (adult) (pediatric): Secondary | ICD-10-CM | POA: Insufficient documentation

## 2020-11-28 DIAGNOSIS — I2510 Atherosclerotic heart disease of native coronary artery without angina pectoris: Secondary | ICD-10-CM | POA: Insufficient documentation

## 2020-11-28 DIAGNOSIS — R918 Other nonspecific abnormal finding of lung field: Secondary | ICD-10-CM | POA: Diagnosis not present

## 2020-11-28 DIAGNOSIS — I10 Essential (primary) hypertension: Secondary | ICD-10-CM | POA: Diagnosis not present

## 2020-11-28 DIAGNOSIS — J449 Chronic obstructive pulmonary disease, unspecified: Secondary | ICD-10-CM | POA: Insufficient documentation

## 2020-11-28 DIAGNOSIS — Z419 Encounter for procedure for purposes other than remedying health state, unspecified: Secondary | ICD-10-CM | POA: Diagnosis not present

## 2020-11-28 LAB — URINALYSIS, ROUTINE W REFLEX MICROSCOPIC
Bilirubin Urine: NEGATIVE
Glucose, UA: NEGATIVE mg/dL
Hgb urine dipstick: NEGATIVE
Ketones, ur: NEGATIVE mg/dL
Nitrite: NEGATIVE
Protein, ur: NEGATIVE mg/dL
Specific Gravity, Urine: 1.015 (ref 1.005–1.030)
pH: 7 (ref 5.0–8.0)

## 2020-11-28 LAB — CBC
HCT: 42.3 % (ref 36.0–46.0)
Hemoglobin: 14.3 g/dL (ref 12.0–15.0)
MCH: 29.6 pg (ref 26.0–34.0)
MCHC: 33.8 g/dL (ref 30.0–36.0)
MCV: 87.6 fL (ref 80.0–100.0)
Platelets: 269 10*3/uL (ref 150–400)
RBC: 4.83 MIL/uL (ref 3.87–5.11)
RDW: 13.2 % (ref 11.5–15.5)
WBC: 8.4 10*3/uL (ref 4.0–10.5)
nRBC: 0 % (ref 0.0–0.2)

## 2020-11-28 LAB — COMPREHENSIVE METABOLIC PANEL
ALT: 19 U/L (ref 0–44)
AST: 19 U/L (ref 15–41)
Albumin: 3.8 g/dL (ref 3.5–5.0)
Alkaline Phosphatase: 74 U/L (ref 38–126)
Anion gap: 13 (ref 5–15)
BUN: 11 mg/dL (ref 8–23)
CO2: 27 mmol/L (ref 22–32)
Calcium: 9.9 mg/dL (ref 8.9–10.3)
Chloride: 98 mmol/L (ref 98–111)
Creatinine, Ser: 0.81 mg/dL (ref 0.44–1.00)
GFR, Estimated: >60 mL/min (ref >60)
Glucose, Bld: 137 mg/dL — ABNORMAL HIGH (ref 70–99)
Potassium: 4 mmol/L (ref 3.5–5.1)
Sodium: 138 mmol/L (ref 135–145)
Total Bilirubin: 1.2 mg/dL (ref 0.3–1.2)
Total Protein: 7.1 g/dL (ref 6.5–8.1)

## 2020-11-28 LAB — PROTIME-INR
INR: 1 (ref 0.8–1.2)
Prothrombin Time: 12.4 seconds (ref 11.4–15.2)

## 2020-11-28 LAB — TYPE AND SCREEN
ABO/RH(D): A POS
Antibody Screen: NEGATIVE

## 2020-11-28 LAB — HEMOGLOBIN A1C
Hgb A1c MFr Bld: 6.8 % — ABNORMAL HIGH (ref 4.8–5.6)
Mean Plasma Glucose: 148.46 mg/dL

## 2020-11-28 LAB — GLUCOSE, CAPILLARY: Glucose-Capillary: 148 mg/dL — ABNORMAL HIGH (ref 70–99)

## 2020-11-28 LAB — SURGICAL PCR SCREEN
MRSA, PCR: NEGATIVE
Staphylococcus aureus: NEGATIVE

## 2020-11-28 IMAGING — CR DG CHEST 2V
2 series · 2 of 2 positions shown · non-contrast
Comparison: [DATE] chest radiograph and prior.

CLINICAL DATA: Preop testing.

EXAM:
CHEST - 2 VIEW

[w chest pa]
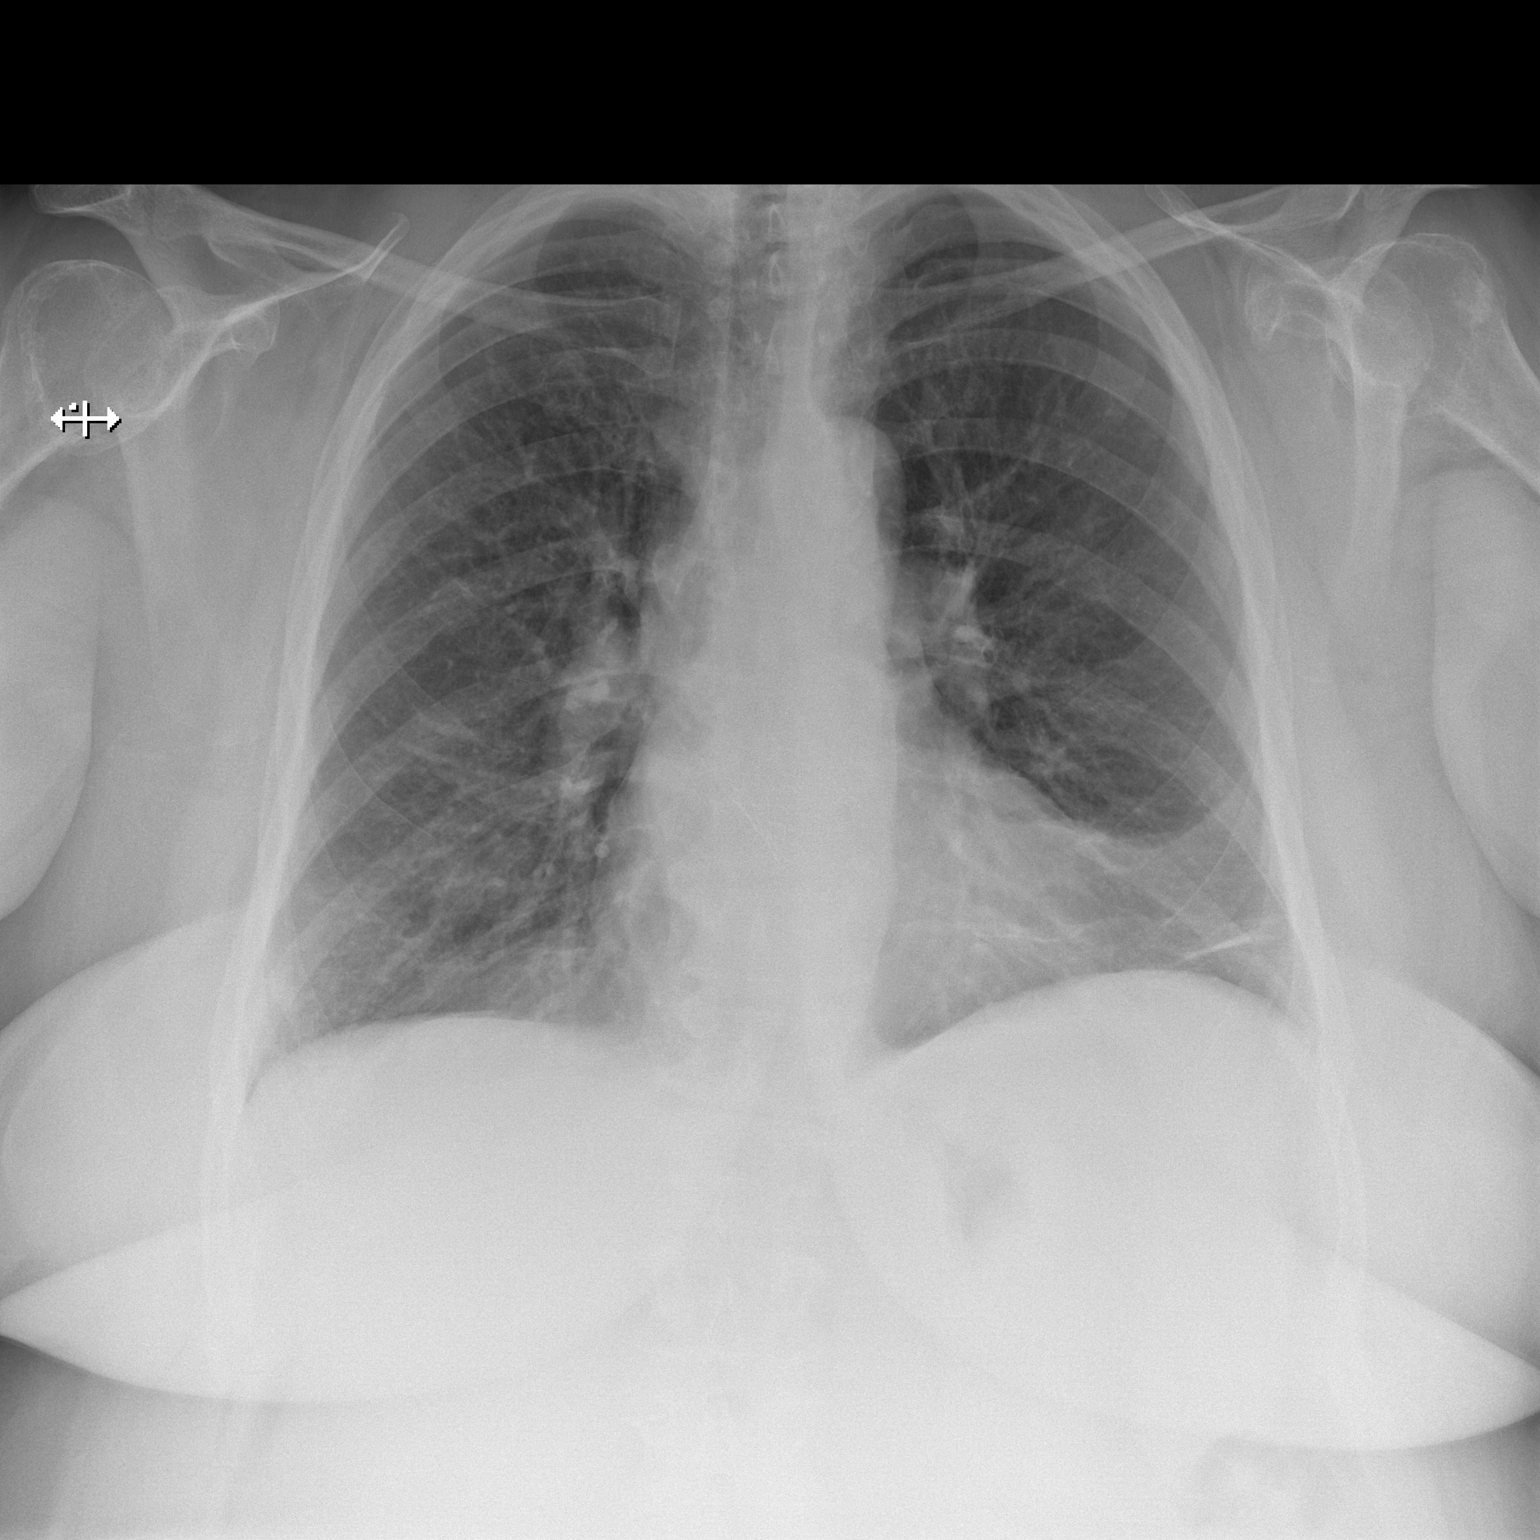

[w chest lat]
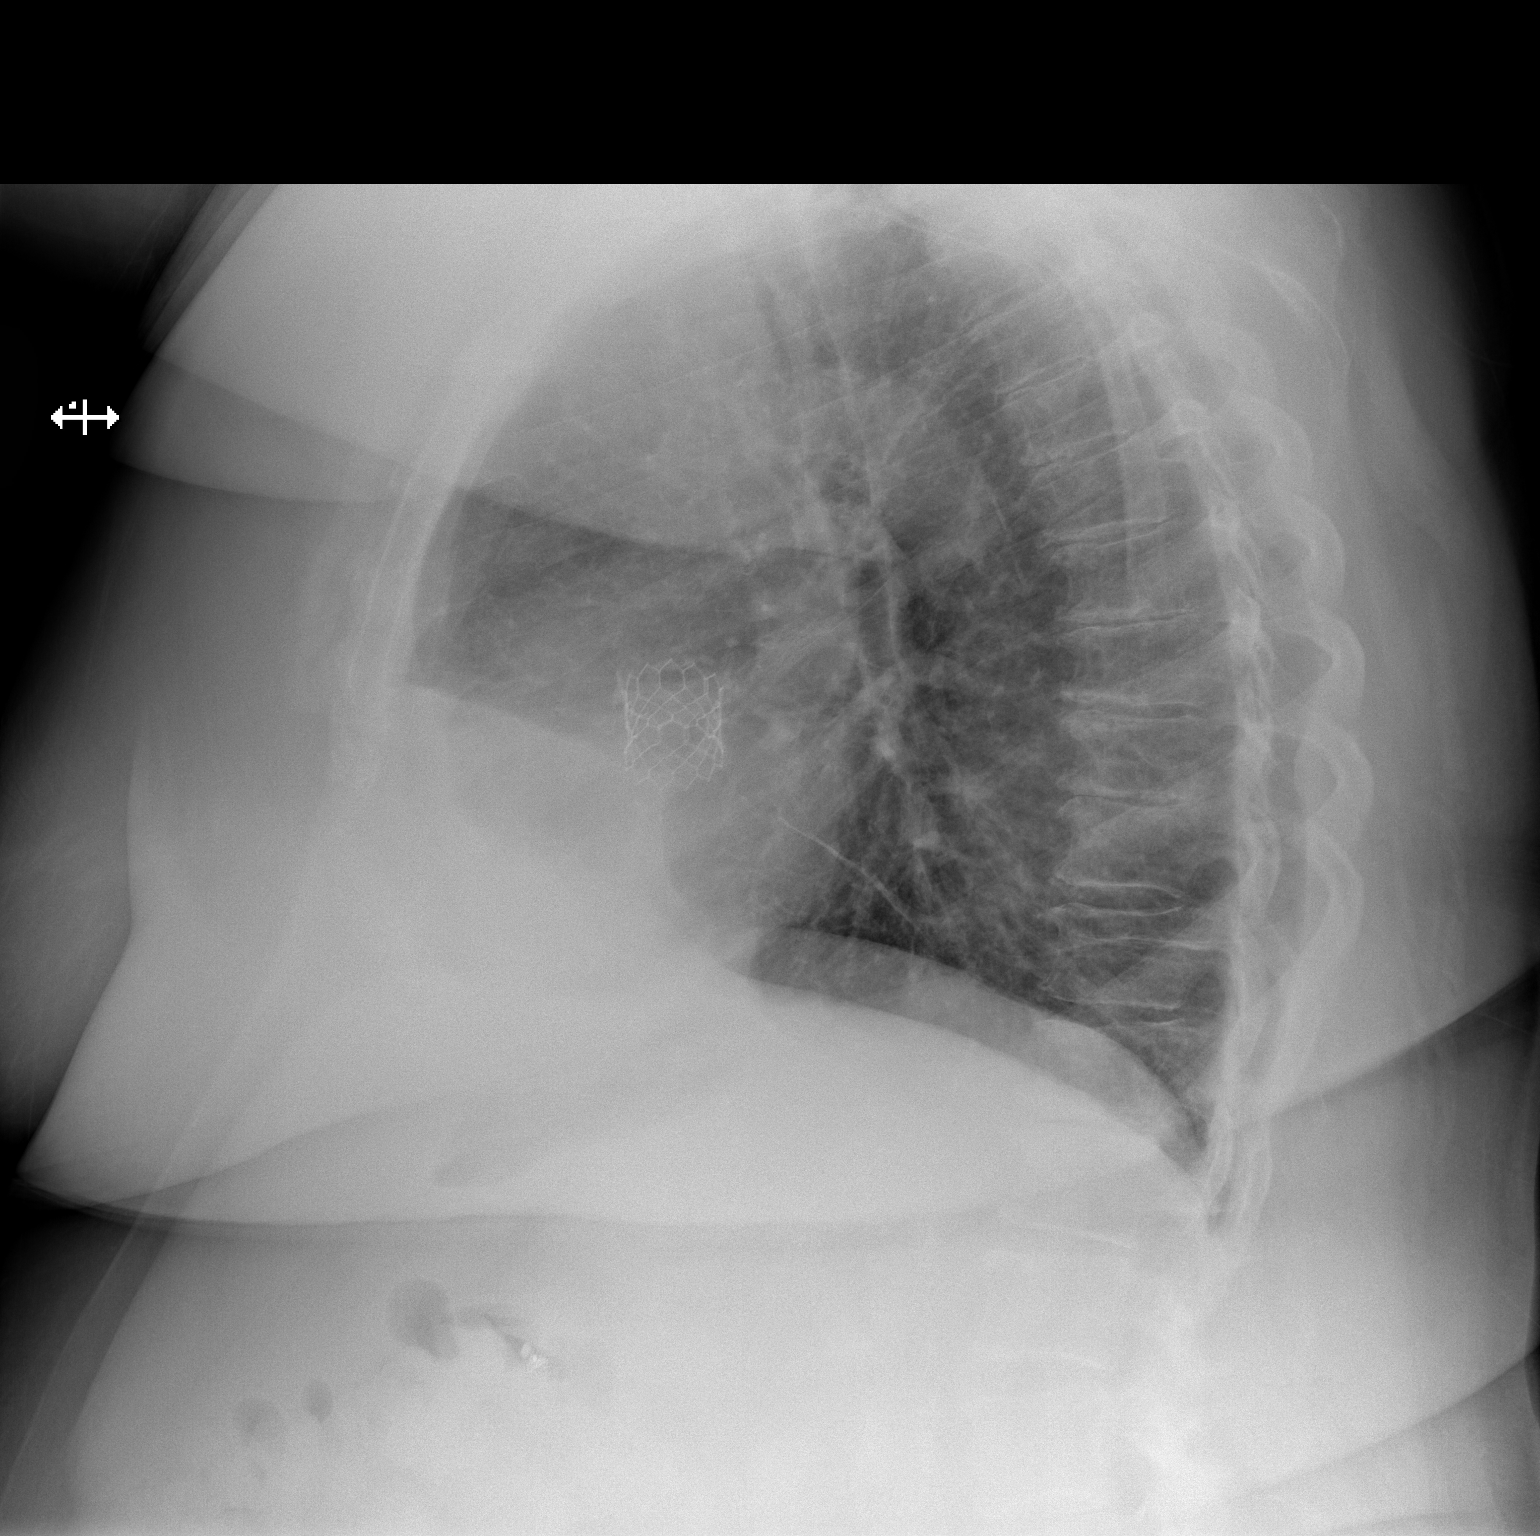

[2 of 2 positions shown; findings below may reference images not displayed]

FINDINGS: Linear bibasilar opacities. No pneumothorax or pleural effusion.
Postprocedural appearance of the cardiomediastinal silhouette,
unchanged. Multilevel spondylosis.
IMPRESSION: Linear bibasilar opacities, atelectasis versus scarring.

No acute airspace disease.

## 2020-11-28 NOTE — Progress Notes (Signed)
PCP - Dr. Collene Gobble Cardiologist - Dr. Angelena Form  PPM/ICD - n/a Device Orders -  Rep Notified -   Chest x-ray - 11/28/2020 EKG - 11/28/20 Stress Test - 02/28/2013 ECHO - 01/17/2020 Cardiac Cath - 2015  Sleep Study - yes, 2014, in Epic CPAP - no, wears O2 PRN  Fasting Blood Sugar -  120s Checks Blood Sugar 2  times a day  Blood Thinner Instructions: plavix last dose 11/28/2020, per Dr. Angelena Form, hold 5-7 days  Aspirin Instructions: per Dr. Angelena Form, hold 5-7 days, patient states she has not taken ASA in over 1 month  ERAS Protcol - clears until 0430 PRE-SURGERY Ensure or G2- Bottle of water given to patient at PAT appointment, to be completed by0430 DOS  COVID TEST- 11/30/2020   Anesthesia review: yes, significant cardiac history, COPD, CHF  Patient denies shortness of breath, fever, cough and chest pain at PAT appointment   All instructions explained to the patient, with a verbal understanding of the material. Patient agrees to go over the instructions while at home for a better understanding. Patient also instructed to self quarantine after being tested for COVID-19. The opportunity to ask questions was provided.

## 2020-11-28 NOTE — Telephone Encounter (Signed)
Please advise how long it is okay to hold Plavix and Aspirin for upcoming lumbar surgery.  Thanks!

## 2020-11-28 NOTE — Progress Notes (Signed)
Notified Dr. Louanne Skye and Jeneen Rinks, [place] of patient's abnormal UA

## 2020-11-29 ENCOUNTER — Encounter (HOSPITAL_COMMUNITY): Payer: Self-pay | Admitting: Physician Assistant

## 2020-11-29 NOTE — Anesthesia Preprocedure Evaluation (Deleted)
Anesthesia Evaluation    Airway        Dental   Pulmonary former smoker,           Cardiovascular hypertension,      Neuro/Psych    GI/Hepatic   Endo/Other  diabetes  Renal/GU      Musculoskeletal   Abdominal   Peds  Hematology   Anesthesia Other Findings   Reproductive/Obstetrics                             Anesthesia Physical Anesthesia Plan  ASA:   Anesthesia Plan:    Post-op Pain Management:    Induction:   PONV Risk Score and Plan:   Airway Management Planned:   Additional Equipment:   Intra-op Plan:   Post-operative Plan:   Informed Consent:   Plan Discussed with:   Anesthesia Plan Comments: (PAT note by Karoline Caldwell, PA-C: Patient has a history of severe aortic stenosis now s/p TAVR (2015), moderate mitral stenosis, CAD, OSA, HTN, HLD, obesity, GERD, asthma, COPD, former tobacco abuse.  She had a cardiac cath in February 2010 and had a drug eluting stent (2.41mm Xience) placed in the distal RCA. Her LAD and Circumflex were free of disease. She had her right knee replaced in February 2585, complicated by a PE. She was started on coumadin and treated for 6 months. CTA chest on 08/09/12 showed no evidence of PE so her coumadin was stopped. When seen in February 2014 and she c/o severe dyspnea with minimal exertion - felt that this was multi-factorial. Chest CTA with no PE. Stress myoview showed possible ischemia. Echo with severe AS, normal LV function. Cardiac cath on 03/17/13 with mild non-obstructive disease and patent stent RCA. She has continued to have severe dyspnea. Most recent cath 09/14/14 with 70% ostial RCA stenosis treated medically and no obstructive disease in the LAD or Circumflex. She underwent TAVR on 12/12/14 and did well. (26 mm Sapien 3 bioprosthetic valve). She has known moderate mitral stenosis that is monitored with yearly echo.   Last seen by cardiology  10/17/2020 for preop clearance per note, "she is at increased risk due to not being able to complete over 4 mets of activity (due to back pain) and her other co-morbidities - she has no active chest pain reported. This is coming down to more of a quality of life issue for her.  I do not feel that further testing is warranted - she is not a great candidate for stress testing given her size with a most likely abnormal result to follow) - she has no active chest pain.  Will be available as needed. She has been given the ok to hold DAPT per Dr. Angelena Form.   She also notes that she has had prior PE/DVT following prior knee surgery many years ago - need to keep this in mind in the post op phase."  Patient was also seen preoperatively by her PCP Dr. Marva Panda 10/25/2020.  Per note, "She is presenting for preoperative assessment for lumbar fusion. Revised cardiac risk index with 15% 30-day perioperative risk of cardiac arrest/MI/death. Discussed with patient and recommendations sent to the neurosurgeon."  COPD maintained on Trelegy, uses supplemental O2 at night, not on CPAP.   Patient reports last dose of Plavix 11/28/2020. DMII well controlled, A1c 6.8.  Preop labs reviewed, unremarkable.   CHEST - 2 VIEW 11/28/20:  COMPARISON:  01/25/2018 chest radiograph and prior.  FINDINGS: Linear bibasilar opacities. No  pneumothorax or pleural effusion. Postprocedural appearance of the cardiomediastinal silhouette, unchanged. Multilevel spondylosis.  IMPRESSION: Linear bibasilar opacities, atelectasis versus scarring.  No acute airspace disease.  TTE 01/17/20: 1. Left ventricular ejection fraction, by visual estimation, is 60 to  65%. The left ventricle has normal function. There is no left ventricular  hypertrophy.  2. Left ventricular diastolic parameters are consistent with Grade I  diastolic dysfunction (impaired relaxation).  3. The left ventricle has no regional wall motion abnormalities.  4.  Global right ventricle has normal systolic function.The right  ventricular size is normal. No increase in right ventricular wall  thickness.  5. Left atrial size was normal.  6. Right atrial size was normal.  7. The mitral valve is grossly normal. No evidence of mitral valve  regurgitation. Moderate mitral stenosis.  8. The tricuspid valve is normal in structure.  9. The tricuspid valve is normal in structure. Tricuspid valve  regurgitation is not demonstrated.  10. Aortic valve regurgitation is not visualized. No evidence of aortic  valve sclerosis or stenosis.  11. The pulmonic valve was normal in structure. Pulmonic valve  regurgitation is trivial.  12. The atrial septum is grossly normal.   )        Anesthesia Quick Evaluation

## 2020-11-29 NOTE — Progress Notes (Signed)
Anesthesia Chart Review:  Patient has a history of severe aortic stenosis now s/p TAVR (2015), moderate mitral stenosis, CAD, OSA, HTN, HLD, obesity, GERD, asthma, COPD, former tobacco abuse.  She had a cardiac cath in February 2010 and had a drug eluting stent (2.44mm Xience) placed in the distal RCA. Her LAD and Circumflex were free of disease. She had her right knee replaced in February 1610, complicated by a PE. She was started on coumadin and treated for 6 months. CTA chest on 08/09/12 showed no evidence of PE so her coumadin was stopped. When seen in February 2014 and she c/o severe dyspnea with minimal exertion - felt that this was multi-factorial. Chest CTA with no PE. Stress myoview showed possible ischemia. Echo with severe AS, normal LV function. Cardiac cath on 03/17/13 with mild non-obstructive disease and patent stent RCA. She has continued to have severe dyspnea. Most recent cath 09/14/14 with 70% ostial RCA stenosis treated medically and no obstructive disease in the LAD or Circumflex. She underwent TAVR on 12/12/14 and did well. (26 mm Sapien 3 bioprosthetic valve). She has known moderate mitral stenosis that is monitored with yearly echo.   Last seen by cardiology 10/17/2020 for preop clearance per note, "she is at increased risk due to not being able to complete over 4 mets of activity (due to back pain) and her other co-morbidities - she has no active chest pain reported. This is coming down to more of a quality of life issue for her.  I do not feel that further testing is warranted - she is not a great candidate for stress testing given her size with a most likely abnormal result to follow) - she has no active chest pain.  Will be available as needed. She has been given the ok to hold DAPT per Dr. Angelena Form.   She also notes that she has had prior PE/DVT following prior knee surgery many years ago - need to keep this in mind in the post op phase."  Patient was also seen preoperatively by  her PCP Dr. Marva Panda 10/25/2020.  Per note, "She is presenting for preoperative assessment for lumbar fusion. Revised cardiac risk index with 15% 30-day perioperative risk of cardiac arrest/MI/death. Discussed with patient and recommendations sent to the neurosurgeon."  COPD maintained on Trelegy, uses supplemental O2 at night, not on CPAP.   Patient reports last dose of Plavix 11/28/2020. DMII well controlled, A1c 6.8.  Preop labs reviewed, unremarkable.   CHEST - 2 VIEW 11/28/20:  COMPARISON:  01/25/2018 chest radiograph and prior.  FINDINGS: Linear bibasilar opacities. No pneumothorax or pleural effusion. Postprocedural appearance of the cardiomediastinal silhouette, unchanged. Multilevel spondylosis.  IMPRESSION: Linear bibasilar opacities, atelectasis versus scarring.  No acute airspace disease.  TTE 01/17/20: 1. Left ventricular ejection fraction, by visual estimation, is 60 to  65%. The left ventricle has normal function. There is no left ventricular  hypertrophy.  2. Left ventricular diastolic parameters are consistent with Grade I  diastolic dysfunction (impaired relaxation).  3. The left ventricle has no regional wall motion abnormalities.  4. Global right ventricle has normal systolic function.The right  ventricular size is normal. No increase in right ventricular wall  thickness.  5. Left atrial size was normal.  6. Right atrial size was normal.  7. The mitral valve is grossly normal. No evidence of mitral valve  regurgitation. Moderate mitral stenosis.  8. The tricuspid valve is normal in structure.  9. The tricuspid valve is normal in structure. Tricuspid valve  regurgitation is not demonstrated.  10. Aortic valve regurgitation is not visualized. No evidence of aortic  valve sclerosis or stenosis.  11. The pulmonic valve was normal in structure. Pulmonic valve  regurgitation is trivial.  12. The atrial septum is grossly normal.    Wynonia Musty [facility] Short Stay Center/Anesthesiology Phone [phone] 11/29/2020 11:20 AM

## 2020-11-30 ENCOUNTER — Other Ambulatory Visit (HOSPITAL_COMMUNITY)
Admission: RE | Admit: 2020-11-30 | Discharge: 2020-11-30 | Disposition: A | Discharge status: Home / Self Care | Payer: Medicaid Other | Source: Ambulatory Visit | Attending: Specialist | Admitting: Specialist

## 2020-11-30 DIAGNOSIS — Z20822 Contact with and (suspected) exposure to covid-19: Secondary | ICD-10-CM | POA: Insufficient documentation

## 2020-11-30 DIAGNOSIS — Z01812 Encounter for preprocedural laboratory examination: Secondary | ICD-10-CM | POA: Diagnosis not present

## 2020-11-30 LAB — SARS CORONAVIRUS 2 (TAT 6-24 HRS): SARS Coronavirus 2: NEGATIVE

## 2020-11-30 MED ORDER — DEXTROSE 5 % IV SOLN
3.0000 g | INTRAVENOUS | Status: DC
  Filled 2020-11-30: qty 3000

## 2020-12-03 ENCOUNTER — Ambulatory Visit (HOSPITAL_COMMUNITY): Admission: RE | Admit: 2020-12-03 | Payer: Medicaid Other | Source: Home / Self Care | Admitting: Specialist

## 2020-12-03 ENCOUNTER — Encounter (HOSPITAL_COMMUNITY): Admission: RE | Payer: Self-pay | Source: Home / Self Care

## 2020-12-03 SURGERY — POSTERIOR LUMBAR FUSION 1 LEVEL
Anesthesia: General

## 2020-12-04 ENCOUNTER — Ambulatory Visit (INDEPENDENT_AMBULATORY_CARE_PROVIDER_SITE_OTHER): Payer: Medicaid Other | Admitting: Student

## 2020-12-04 ENCOUNTER — Encounter: Payer: Self-pay | Admitting: Student

## 2020-12-04 ENCOUNTER — Other Ambulatory Visit: Payer: Self-pay

## 2020-12-04 DIAGNOSIS — J069 Acute upper respiratory infection, unspecified: Secondary | ICD-10-CM | POA: Diagnosis not present

## 2020-12-04 DIAGNOSIS — Z20822 Contact with and (suspected) exposure to covid-19: Secondary | ICD-10-CM | POA: Diagnosis not present

## 2020-12-04 NOTE — Assessment & Plan Note (Addendum)
Patient complains of cough, sore throat, congestion, increased sputum production, and wheezing that started 3 days ago.  She denies fever, body aches, loss of taste or smell, rhinorrhea, conjunctivitis, chest pain, nausea, vomiting.  Patient states that she is feeling fine except for the cough which is bothering her.  Patient has underlying asthma and states that her shortness of breath is at baseline and does not require oxygen.  She is using Trelegy daily and albuterol inhaler 2-3 times a day (normally 1-2 times a day).  She is also taking Mucinex for her congestion.  She had a negative Covid test 4 days ago and has had Covid vaccination.  She also had an appointment for Covid test this morning at 11 AM.  She had to reschedule her back surgery until beginning of January due to her symptoms.  Patient states that her husband has developed symptoms about 5 days ago and has been hospitalized for respiratory distress.  He was diagnosed with RSV in the hospital.  Patient is in a shared home and states that one of the tenants there also experienced similar symptoms and had a Covid test this morning.  Result is not back yet.   Assessment and plan: This is likely RSV given exposure from her husband.  Will treat symptomatically with Mucinex and Robitussin.  Continue her Trelegy and albuterol inhaler.  Pending Covid test.   Patient is advised to call the clinic or go to the ED if she develops worsening shortness of breath.

## 2020-12-04 NOTE — Progress Notes (Signed)
  [facility] Telephone Encounter Continuity Care Appointment  HPI:   This telephone encounter was created for [name] on 12/04/2020 for the following purpose/cc Covid-like symptoms   Past Medical History:  Past Medical History:  Diagnosis Date  . Anemia   . Aortic valve stenosis, severe   . 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 Asthma   . Blood transfusion    [facility]  . Breast cancer in female [facility])    Right  . CAD (coronary artery disease)    Cath 2010 with DES x 1 RCA-- PT'S CARDIOLOGIST IS DR. [place]  . Chronic diastolic congestive heart failure ([place])   . COPD (chronic obstructive pulmonary disease) ([place])   . Depression   . Diabetes mellitus DIAGNOSED IN2010   Controll s with diet  . Eczema    on back  . Hyperlipidemia   . Hypertension   . Kidney stones   . Morbid obesity with body mass index of 60.0-69.9 in adult [facility])   . Myocardial infarction ([place])    PT THINKS SHE WAS DX WITH MI AT THE TIME OF HEART STENTING  . Pulmonary embolism ([place]) 02/08/12   S/P RT TOTAL KNEE ON 02/03/12--ON 02/08/12--DEVELOPED ACUTE SOB AND CHEST PAIN--AND DIAGNOSED WITH  PULMONARY EMBOLUS AND PNEUMONIA  . Restless leg syndrome   . Sleep apnea    uses 3 liters O2 at night   . Uterine fibroid    NO PROBLEMS AT PRESENT FROM THE FIBROIDS-STATES SHE IS POST MENOPAUSAL-LAST MENSES 2010 EXCEPT FOR EPISODE THIS YR OF BLEEDING RELATED TO FIBROIDS.  Marland Kitchen Weakness    BOTH HANDS - S/P BILATERAL CARPAL TUNNEL RELEASE--BUT STILL HAS WEAKNESS--OFTEN DROPS THINGS      ROS:  Review of Systems  Constitutional: Negative for fever.  HENT: Positive for congestion and sore throat.        No one sense of taste or smell  Respiratory: Positive for cough, sputum production and wheezing. Negative for shortness of breath.   Cardiovascular: Negative for chest pain.       Assessment / Plan / Recommendations:   Please see A&P under problem oriented charting for assessment of the patient's acute and chronic medical conditions.   As always, pt is advised that if symptoms worsen or new symptoms arise, they should go to an urgent care facility or to to ER for further evaluation.   Consent and Medical Decision Making:   Patient seen with Dr. Evette Doffing  This is a telephone encounter between [name] and Gaylan Gerold on 12/04/2020 for Covid-like symptoms. The visit was conducted with the patient located at home and Gaylan Gerold at [facility]. The patient's identity was confirmed using their DOB and current address. The patient has consented to being evaluated through a telephone encounter and understands the associated risks (an examination cannot be done and the patient may need to come in for an appointment) / benefits (allows the patient to remain at home, decreasing exposure to coronavirus). I personally spent 11 minutes on medical discussion.

## 2020-12-05 ENCOUNTER — Telehealth: Payer: Self-pay

## 2020-12-05 NOTE — Telephone Encounter (Signed)
Talked to Alfonse Spruce - stated he prefers to see pt in person ; covid test negative. He can see pt tomorrow. Called pt  - stated she will be able to come tomorrow @ 1315 PM.

## 2020-12-05 NOTE — Telephone Encounter (Signed)
Talked to pt - stated she talked to Dr Alfonse Spruce yesterday. She is not feeling better; having trouble with her breathing; using 3.5 - 4L of oxygen. Waiting on COVID test result. Productive cough - light yellow sputum. I asked pt to check temp which is 99.9. She's using her inhaler. Pt is requesting rx for abx. Please advise. Thanks

## 2020-12-05 NOTE — Progress Notes (Signed)
Internal Medicine Clinic Attending  I spoke with the patient by phone.  I personally confirmed the key portions of the history documented by Dr. Alfonse Spruce and I reviewed pertinent patient test results.  The assessment, diagnosis, and plan were formulated together and I agree with the documentation in the resident's note.

## 2020-12-05 NOTE — Telephone Encounter (Signed)
Thank you so much Glenda.

## 2020-12-05 NOTE — Addendum Note (Signed)
Addended by: Lalla Brothers T on: 12/05/2020 01:23 PM   Modules accepted: Level of Service

## 2020-12-05 NOTE — Telephone Encounter (Signed)
Pls contact pt regarding [phone]

## 2020-12-05 NOTE — Telephone Encounter (Signed)
Requesting to speak with a nurse about getting antibiotic. Please call pt back.

## 2020-12-06 ENCOUNTER — Ambulatory Visit (INDEPENDENT_AMBULATORY_CARE_PROVIDER_SITE_OTHER): Payer: Medicaid Other | Admitting: Student

## 2020-12-06 ENCOUNTER — Encounter: Payer: Self-pay | Admitting: Student

## 2020-12-06 ENCOUNTER — Telehealth: Payer: Self-pay | Admitting: *Deleted

## 2020-12-06 ENCOUNTER — Other Ambulatory Visit: Payer: Self-pay | Admitting: Internal Medicine

## 2020-12-06 VITALS — BP 132/75 | HR 104 | Temp 99.2°F | Ht 63.0 in | Wt 310.0 lb

## 2020-12-06 DIAGNOSIS — J441 Chronic obstructive pulmonary disease with (acute) exacerbation: Secondary | ICD-10-CM

## 2020-12-06 MED ORDER — METHYLPREDNISOLONE SODIUM SUCC 125 MG IJ SOLR
60.0000 mg | Freq: Once | INTRAMUSCULAR | Status: AC
  Administered 2020-12-06: 60 mg via INTRAMUSCULAR

## 2020-12-06 MED ORDER — ALBUTEROL SULFATE (2.5 MG/3ML) 0.083% IN NEBU
2.5000 mg | INHALATION_SOLUTION | Freq: Once | RESPIRATORY_TRACT | Status: AC
  Administered 2020-12-06: 2.5 mg via RESPIRATORY_TRACT

## 2020-12-06 MED ORDER — IPRATROPIUM BROMIDE 0.02 % IN SOLN
0.5000 mg | Freq: Once | RESPIRATORY_TRACT | Status: AC
  Administered 2020-12-06: 0.5 mg via RESPIRATORY_TRACT

## 2020-12-06 MED ORDER — AZITHROMYCIN 250 MG PO TABS: ORAL_TABLET | ORAL | 0 refills | Status: AC

## 2020-12-06 MED ORDER — PREDNISONE 20 MG PO TABS: 40.0000 mg | ORAL_TABLET | Freq: Every day | ORAL | 0 refills | Status: AC

## 2020-12-06 MED ORDER — IPRATROPIUM-ALBUTEROL 0.5-2.5 (3) MG/3ML IN SOLN: 3.0000 mL | Freq: Once | RESPIRATORY_TRACT | Status: DC

## 2020-12-06 NOTE — Assessment & Plan Note (Signed)
Patient had a telehealth visit 2 days ago for complaining of coughing and congestion.  Her husband was admitted last Thursday for RSV with underlying COPD.  She has been trying Mucinex for congestion.  However her shortness of breath is worsening with increasing cough and sputum production.  States that her sputum became more yellow.  She denies fever or chills.  States that she uses her albuterol inhaler 4 times a day.  Patient is not on oxygen at baseline but has to start using oxygen for shortness of breath at home.  She had a negative Covid test yesterday and she is vaccinated against Covid.  Assessment and plan Patient has an acute COPD exacerbation, likely due to RSV exposure.  Patient is tachypneic and has wheezing on lung auscultation.  Her O2 sat is 87% on room air and 96% on 3 L.  Unsure of her O2 sat at room air.  Patient was given a DuoNeb nebulizer treatment and 1 dose of 60 mg Solu-Medrol in the clinic.  Her respiratory status improved after the treatment with less wheezing and respiratory distress.  Prescription for steroid and Z-Pak sent to her pharmacy.  Patient is advised to call the clinic or go to the ED if her shortness of breath worsens.  She will come back on Monday for follow-up. -Prednisone 40 mg for 4 days -Z-Pak for 5 days -Continue Trelegy and albuterol at home -Patient has oxygen at home -Follow-up on Monday

## 2020-12-06 NOTE — Telephone Encounter (Signed)
Pharmacy called, cannot process scripts written today due to [facility] certification, gave new scripts VO changing to dr vincent's name and dea, npi Azithromycin 250 #6 Prednisone 20mg  #8

## 2020-12-06 NOTE — Telephone Encounter (Signed)
Thank you :)

## 2020-12-06 NOTE — Progress Notes (Signed)
   CC: Acute COPD exacerbation  HPI:  [name] is a 59 y.o. with past medical history of COPD, diabetes, hypertension, who presents the clinic for an acute COPD exacerbation.  Please see problem based charting for further detail.  Past Medical History:  Diagnosis Date  . Anemia   . Aortic valve stenosis, severe   . 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 Asthma   . Blood transfusion    [facility]  . Breast cancer in female [facility])    Right  . CAD (coronary artery disease)    Cath 2010 with DES x 1 RCA-- PT'S CARDIOLOGIST IS DR. [place]  . Chronic diastolic congestive heart failure ([place])   . COPD (chronic obstructive pulmonary disease) ([place])   . Depression   . Diabetes mellitus DIAGNOSED IN2010   Controll s with diet  . Eczema    on back  . Hyperlipidemia   . Hypertension   . Kidney stones   . Morbid obesity with body mass index of 60.0-69.9 in adult [facility])   . Myocardial infarction ([place])    PT THINKS SHE WAS DX WITH MI AT THE TIME OF HEART STENTING  . Pulmonary embolism ([place]) 02/08/12   S/P RT TOTAL KNEE ON 02/03/12--ON 02/08/12--DEVELOPED ACUTE SOB AND CHEST PAIN--AND DIAGNOSED WITH  PULMONARY EMBOLUS AND PNEUMONIA  . Restless leg syndrome   . Sleep apnea    uses 3 liters O2 at night   . Uterine fibroid    NO PROBLEMS AT PRESENT FROM THE FIBROIDS-STATES SHE IS POST MENOPAUSAL-LAST MENSES 2010 EXCEPT FOR EPISODE THIS YR OF BLEEDING RELATED TO FIBROIDS.  Marland Kitchen Weakness    BOTH HANDS - S/P BILATERAL CARPAL TUNNEL RELEASE--BUT STILL HAS WEAKNESS--OFTEN DROPS THINGS   Review of Systems: As per HPI   Physical Exam:  Vitals:   12/06/20 1307 12/06/20 1411  BP: 132/75   Pulse: (!) 104   Temp: 99.2 F (37.3 C)   SpO2: (!) 87% 97%  Weight: (!) 310 lb (140.6 kg)   Height: 5\' 3"  (1.6 m)    Physical Exam Constitutional:      General: She is in acute distress.      Comments: Tachypneic  Eyes:     General:        Right eye: No discharge.        Left eye: No discharge.  Cardiovascular:     Rate and Rhythm: Normal rate and regular rhythm.  Pulmonary:     Effort: Respiratory distress present.     Breath sounds: Wheezing (Inspiratory and expiratory wheezing) present.  Musculoskeletal:     Right lower leg: No edema.     Left lower leg: No edema.  Neurological:     Mental Status: She is alert.  Psychiatric:        Mood and Affect: Mood normal.     Assessment & Plan:   See Encounters Tab for problem based charting.  Patient seen with Dr. Dareen Piano

## 2020-12-06 NOTE — Patient Instructions (Signed)
Ms. Evans,  I am glad that you felt better after the breathing treatment and steroid injection.  I sent the prescription of steroid and antibiotic to your pharmacy.  Please call the clinic or go to the ED if your shortness of breath worsens.   Take care  Dr. Alfonse Spruce

## 2020-12-10 ENCOUNTER — Encounter: Payer: Self-pay | Admitting: Internal Medicine

## 2020-12-10 ENCOUNTER — Ambulatory Visit (INDEPENDENT_AMBULATORY_CARE_PROVIDER_SITE_OTHER): Payer: Medicaid Other | Admitting: Internal Medicine

## 2020-12-10 ENCOUNTER — Other Ambulatory Visit: Payer: Self-pay

## 2020-12-10 DIAGNOSIS — J441 Chronic obstructive pulmonary disease with (acute) exacerbation: Secondary | ICD-10-CM

## 2020-12-10 MED ORDER — ALBUTEROL SULFATE (2.5 MG/3ML) 0.083% IN NEBU: INHALATION_SOLUTION | RESPIRATORY_TRACT | 1 refills | Status: DC

## 2020-12-10 MED ORDER — GUAIFENESIN-CODEINE 100-10 MG/5ML PO SYRP: 5.0000 mL | ORAL_SOLUTION | Freq: Three times a day (TID) | ORAL | 0 refills | Status: DC | PRN

## 2020-12-10 NOTE — Patient Instructions (Addendum)
To Ms. [place],  It was a pleasure seeing you today! Today we discussed your COPD exacerbation caused by RSV. You do still have some slight wheezing on physical examination, but I am glad you are doing better than your last visit. I will refill your albuterol, and I have also ordered a cough suppressant. Please take the suppressant 3 times daily as needed for cough. Please reach out to [place] if your cough begins to worsen, or your require additional oxygen supplementation to schedule an appointment. Otherwise we will follow up in a month. Have a happy holiday season!  Sincerely,  Maudie Mercury, MD

## 2020-12-10 NOTE — Progress Notes (Signed)
Internal Medicine Clinic Attending  I saw and evaluated the patient.  I personally confirmed the key portions of the history and exam documented by Dr. Nguyen and I reviewed pertinent patient test results.  The assessment, diagnosis, and plan were formulated together and I agree with the documentation in the resident's note.\  

## 2020-12-12 ENCOUNTER — Telehealth: Payer: Self-pay

## 2020-12-12 ENCOUNTER — Encounter: Payer: Self-pay | Admitting: Internal Medicine

## 2020-12-12 NOTE — Assessment & Plan Note (Addendum)
Patient presents for follow up for her COPD exacerbation. She states that she is doing much better after completing her prednisone and azithromycin. She is accompanied by her daughter, who states that her mother much better after Saturday. According to both the patient and her daughter, the patient was generally fatigued on Saturday, and needed additional O2 (baseline 3L->4L on Saturday), but was much improved on Sunday.   Patient states that her sputum has also turned from yellow to white, but continues to endorse a cough that keeps her up at night. Additionally, her home portable O2 device is broken, but she has been using her husband's when leaving the house, and her stationary at home. She is in the process of having her portable device sent off for maintenance.   A/P Patient presents with much improvement. She has completed a course of prednisone and azithromycin. She is able to speak full sentences with regular respiratory effort. She is currently breathing on room air. On physical examination, she has slight wheezing in the RUL, but is clear to ausculation in the other lung fields. She complains of continued cough. We discussed that her exacerbation was likely due to a viral etiology and that her cough may last several weeks to months. She has tried cough lozenges and mucinex with no improvement. Will order Robitussin. She is in need of refills on her inhaler, which will be ordered today. We discussed return precautions such as increased cough, fevers, or increased O2 demand to call the clinic or go to the nearest ED or urgent care. Patient voiced agreement.  - Home Albuterol refilled, Continue to use as needed. - Ordered Robitussin AC 100-10/55ml syrup 5 ml three times daily.

## 2020-12-12 NOTE — Progress Notes (Signed)
   CC: COPD Exacerbation  HPI:  [name] is a 59 y.o. M/F, with a PMH noted below, who presents to the clinic for follow up on her COPD exacerbation. To see the management of their acute and chronic conditions, please see the A&P note under the Encounters tab.   Past Medical History:  Diagnosis Date  . Anemia   . Aortic valve stenosis, severe   . 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 Asthma   . Blood transfusion    2013[facility]  . Breast cancer in female [facility])    Right  . CAD (coronary artery disease)    Cath 2010 with DES x 1 RCA-- PT'S CARDIOLOGIST IS DR. Hamberg  . Chronic diastolic congestive heart failure ([place])   . COPD (chronic obstructive pulmonary disease) ([place])   . Depression   . Diabetes mellitus DIAGNOSED IN2010   Controll s with diet  . Eczema    on back  . Hyperlipidemia   . Hypertension   . Kidney stones   . Morbid obesity with body mass index of 60.0-69.9 in adult [facility])   . Myocardial infarction ([place])    PT THINKS SHE WAS DX WITH MI AT THE TIME OF HEART STENTING  . Pulmonary embolism ([place]) 02/08/12   S/P RT TOTAL KNEE ON 02/03/12--ON 02/08/12--DEVELOPED ACUTE SOB AND CHEST PAIN--AND DIAGNOSED WITH  PULMONARY EMBOLUS AND PNEUMONIA  . Restless leg syndrome   . Sleep apnea    uses 3 liters O2 at night   . Uterine fibroid    NO PROBLEMS AT PRESENT FROM THE FIBROIDS-STATES SHE IS POST MENOPAUSAL-LAST MENSES 2010 EXCEPT FOR EPISODE THIS YR OF BLEEDING RELATED TO FIBROIDS.  Marland Kitchen Weakness    BOTH HANDS - S/P BILATERAL CARPAL TUNNEL RELEASE--BUT STILL HAS WEAKNESS--OFTEN DROPS THINGS   Review of Systems:   Review of Systems  Constitutional: Negative for chills and fever.  Respiratory: Positive for cough, sputum production and wheezing. Negative for hemoptysis and shortness of breath.   Cardiovascular: Negative for chest pain, palpitations and  orthopnea.  Gastrointestinal: Negative for abdominal pain, constipation, diarrhea, nausea and vomiting.  Skin: Negative for rash.     Physical Exam:  Vitals:   12/10/20 1356  BP: (!) 161/88  Pulse: 94  Temp: 98.1 F (36.7 C)  TempSrc: Oral  SpO2: 95%  Weight: (!) 306 lb 9.6 oz (139.1 kg)   Physical Exam Constitutional:      General: She is not in acute distress.    Appearance: She is obese. She is not toxic-appearing or diaphoretic.  Cardiovascular:     Rate and Rhythm: Normal rate and regular rhythm.     Pulses: Normal pulses.     Heart sounds: Normal heart sounds. No murmur heard. No friction rub. No gallop.   Pulmonary:     Effort: Pulmonary effort is normal. No respiratory distress.     Breath sounds: No stridor. Wheezing present. No rhonchi.     Comments: Faint wheeze in the RUL, no rales, rhonchi appreciated. Abdominal:     General: Abdomen is flat. Bowel sounds are normal.     Palpations: Abdomen is soft.  Neurological:     Mental Status: She is alert.      Assessment & Plan:   See Encounters Tab for problem based charting.  Patient discussed with Dr. Dareen Piano

## 2020-12-12 NOTE — Telephone Encounter (Signed)
Pls contact pt [phone]

## 2020-12-13 NOTE — Telephone Encounter (Signed)
Please resend scripts that dr Eileen Stanford sent in he is no longer medicaid certified. done

## 2020-12-13 NOTE — Telephone Encounter (Signed)
   Primary Cardiologist: Lauree Chandler, MD  Chart reviewed as part of pre-operative protocol coverage. Given past medical history and time since last visit, based on ACC/AHA guidelines, [name] would be at acceptable risk for the planned procedure without further cardiovascular testing.    she is at increased risk due to not being able to complete over 4 mets of activity (due to back pain) and her other co-morbidities - she has no active chest pain reported. This is coming down to more of a quality of life issue for her.  I do not feel that further testing is warranted - she is not a great candidate for stress testing given her size with a most likely abnormal result to follow) - she has no active chest pain.  She has been given the ok to hold DAPT per Dr. Angelena Form.   She also notes that she has had prior PE/DVT following prior knee surgery many years ago - need to keep this in mind in the post op phase.   Her aspirin and Plavix may be held for 5-7 days prior to her procedure.  Please resume as soon as hemostasis is achieved.  I will route this recommendation to the requesting party via Epic fax function and remove from pre-op pool.  Please call with questions.  Jossie Ng. Jakai Onofre NP-C    12/13/2020, 9:13 AM [address] Office [phone] Fax [phone]

## 2020-12-17 ENCOUNTER — Other Ambulatory Visit: Payer: Self-pay

## 2020-12-18 NOTE — Progress Notes (Signed)
Internal Medicine Clinic Attending ? ?Case discussed with Dr. Winters  At the time of the visit.  We reviewed the resident?s history and exam and pertinent patient test results.  I agree with the assessment, diagnosis, and plan of care documented in the resident?s note.  ?

## 2020-12-29 DIAGNOSIS — Z419 Encounter for procedure for purposes other than remedying health state, unspecified: Secondary | ICD-10-CM | POA: Diagnosis not present

## 2021-01-02 NOTE — Progress Notes (Signed)
Your procedure is scheduled on January 10             Report to [facility] Main Entrance "A" at 5:30 A.M., and check in at the Admitting office.             Call this number if you have problems the morning of surgery:             [phone]  Call [phone] if you have any questions prior to your surgery date Monday-Friday 8am-4pm               Remember:             Do not eat after midnight the night before your surgery  You may drink clear liquids until 4:30 AM the morning of your surgery.   Clear liquids allowed are: Water, Non-Citrus Juices (without pulp), Carbonated Beverages, Clear Tea, Black Coffee Only, and Gatorade   Enhanced Recovery after Surgery for Orthopedics Enhanced Recovery after Surgery is a protocol used to improve the stress on your body and your recovery after surgery.  Patient Instructions   The day of surgery (if you have diabetes):  ? Drink ONE 10oz bottle water by 4:30 AM the morning of surgery ? This drink was given to you during your hospital  pre-op appointment visit.  ? Color of the Gatorade may vary. Red is not allowed. ? Nothing else to drink after completing the  Small bottle of water. DO NOT SIP.         If you have questions, please contact your surgeon's office.                           Take these medicines the morning of surgery with A SIP OF WATER:  amlodipine-atorvastatin (CADUET) DULoxetine (CYMBALTA) exemestane (AROMASIN) Fluticasone-Umeclidin-Vilant (TRELEGY ELLIPTA)  metoprolol succinate (TOPROL XL)   IF NEEDED:     acetaminophen (TYLENOL) albuterol (VENTOLIN HFA)  Follow your surgeon's instructions on when to stop clopidogrel (PLAVIX).  If no instructions were given by your surgeon then you will need to call the office to get those instructions.    As of today, STOP taking any Aspirin (unless otherwise instructed by your surgeon) Aleve, Naproxen, Ibuprofen, Motrin, Advil, Goody's, BC's, all herbal  medications, fish oil, and all vitamins.    WHAT DO I DO ABOUT MY DIABETES MEDICATION?    The day of surgery, do not take other diabetes injectables, including Victoza (liraglutide).   HOW TO MANAGE YOUR DIABETES BEFORE AND AFTER SURGERY  Why is it important to control my blood sugar before and after surgery?  Improving blood sugar levels before and after surgery helps healing and can limit problems.  A way of improving blood sugar control is eating a healthy diet by: ?  Eating less sugar and carbohydrates ?  Increasing activity/exercise ?  Talking with your doctor about reaching your blood sugar goals  High blood sugars (greater than 180 mg/dL) can raise your risk of infections and slow your recovery, so you will need to focus on controlling your diabetes during the weeks before surgery.  Make sure that the doctor who takes care of your diabetes knows about your planned surgery including the date and location.  How do I manage my blood sugar before surgery?  Check your blood sugar at least 4 times a day, starting 2 days before surgery, to make sure that the level is not too high or low.  Check your blood sugar the morning of your surgery when you wake up and every 2 hours until you get to the Short Stay unit. ? If your blood sugar is less than 70 mg/dL, you will need to treat for low blood sugar:  Do not take insulin.  Treat a low blood sugar (less than 70 mg/dL) with  cup of clear juice (cranberry or apple), 4glucose tablets, OR glucose gel.  Recheck blood sugar in 15 minutes after treatment (to make sure it is greater than 70 mg/dL). If your blood sugar is not greater than 70 mg/dL on recheck, call [phone] for further instructions.  Report your blood sugar to the short stay nurse when you get to Short Stay.   If you are admitted to the hospital after surgery: ? Your blood sugar will be checked by the staff and you will probably be given insulin after  surgery (instead of oral diabetes medicines) to make sure you have good blood sugar levels. ? The goal for blood sugar control after surgery is 80-180 mg/dL.   Do not wear jewelry, make up, or nail polish Do notwear lotions, powders, perfumes, or deodorant. Do notshave 48 hours prior to surgery. Do notbring valuables to the hospital. [facility] is not responsible for any belongings or valuables.  Do NOT Smoke (Tobacco/Vaping) or drink Alcohol 24 hours prior to your procedure If you use a CPAP at night, you may bring all equipment for your overnight stay.  Contacts, glasses, dentures or bridgework may not be worn into surgery.    For patients admitted to the hospital, discharge time will be determined by your treatment team.  Patients discharged the day of surgery will not be allowed to drive home, and someone needs to stay with them for 24 hours.    Special instructions:   [place]- Preparing For Surgery  Before surgery, you can play an important role. Because skin is not sterile, your skin needs to be as free of germs as possible. You can reduce the number of germs on your skin by washing with CHG (chlorahexidine gluconate) Soap before surgery.  CHG is an antiseptic cleaner which kills germs and bonds with the skin to continue killing germs even after washing.    Oral Hygiene is also important to reduce your risk of infection.  Remember - BRUSH YOUR TEETH THE MORNING OF SURGERY WITH YOUR REGULAR TOOTHPASTE  Please do not use if you have an allergy to CHG or antibacterial soaps. If your skin becomes reddened/irritated stop using the CHG.  Do not shave (including legs and underarms) for at least 48 hours prior to first CHG shower. It is OK to shave your face.  Please follow these instructions carefully.                                                                                                                                1. Shower the NIGHT BEFORE SURGERY and  the MORNING OF SURGERY with CHG Soap.   2. If you chose to wash your hair, wash your hair first as usual with your normal shampoo.  3. After you shampoo, rinse your hair and body thoroughly to remove the shampoo.  4. Use CHG as you would any other liquid soap. You can apply CHG directly to the skin and wash gently with a scrungie or a clean washcloth.   5. Apply the CHG Soap to your body ONLY FROM THE NECK DOWN.  Do not use on open wounds or open sores. Avoid contact with your eyes, ears, mouth and genitals (private parts). Wash Face and genitals (private parts)  with your normal soap.   6. Wash thoroughly, paying special attention to the area where your surgery will be performed.  7. Thoroughly rinse your body with warm water from the neck down.  8. DO NOT shower/wash with your normal soap after using and rinsing off the CHG Soap.  9. Pat yourself dry with a CLEAN TOWEL.  10. Wear CLEAN PAJAMAS to bed the night before surgery  11. Place CLEAN SHEETS on your bed the night of your first shower and DO NOT SLEEP WITH PETS.   Day of Surgery: Wear Clean/Comfortable clothing the morning of surgery Do notapply any deodorants/lotions.  Remember to brush your teeth WITH YOUR REGULAR TOOTHPASTE.  Please read over the following fact sheets that you were given.

## 2021-01-03 ENCOUNTER — Encounter: Payer: Self-pay | Admitting: Surgery

## 2021-01-03 ENCOUNTER — Encounter (HOSPITAL_COMMUNITY)
Admission: RE | Admit: 2021-01-03 | Discharge: 2021-01-03 | Disposition: A | Discharge status: Home / Self Care | Payer: Medicaid Other | Source: Ambulatory Visit | Attending: Specialist | Admitting: Specialist

## 2021-01-03 ENCOUNTER — Ambulatory Visit (INDEPENDENT_AMBULATORY_CARE_PROVIDER_SITE_OTHER): Payer: Medicaid Other | Admitting: Surgery

## 2021-01-03 ENCOUNTER — Other Ambulatory Visit: Payer: Self-pay

## 2021-01-03 ENCOUNTER — Encounter (HOSPITAL_COMMUNITY): Payer: Self-pay

## 2021-01-03 ENCOUNTER — Other Ambulatory Visit (HOSPITAL_COMMUNITY)
Admission: RE | Admit: 2021-01-03 | Discharge: 2021-01-03 | Disposition: A | Discharge status: Home / Self Care | Payer: Medicaid Other | Source: Ambulatory Visit | Attending: Specialist | Admitting: Specialist

## 2021-01-03 VITALS — BP 124/77 | HR 107 | Temp 98.0°F

## 2021-01-03 DIAGNOSIS — Z20822 Contact with and (suspected) exposure to covid-19: Secondary | ICD-10-CM | POA: Diagnosis not present

## 2021-01-03 DIAGNOSIS — Z01812 Encounter for preprocedural laboratory examination: Secondary | ICD-10-CM | POA: Insufficient documentation

## 2021-01-03 DIAGNOSIS — M48062 Spinal stenosis, lumbar region with neurogenic claudication: Secondary | ICD-10-CM

## 2021-01-03 DIAGNOSIS — M4316 Spondylolisthesis, lumbar region: Secondary | ICD-10-CM

## 2021-01-03 HISTORY — DX: Myoneural disorder, unspecified: G70.9

## 2021-01-03 HISTORY — DX: Personal history of other diseases of the digestive system: Z87.19

## 2021-01-03 LAB — TYPE AND SCREEN
ABO/RH(D): A POS
Antibody Screen: NEGATIVE

## 2021-01-03 LAB — GLUCOSE, CAPILLARY: Glucose-Capillary: 158 mg/dL — ABNORMAL HIGH (ref 70–99)

## 2021-01-03 LAB — CBC
HCT: 40.1 % (ref 36.0–46.0)
Hemoglobin: 13.6 g/dL (ref 12.0–15.0)
MCH: 29.7 pg (ref 26.0–34.0)
MCHC: 33.9 g/dL (ref 30.0–36.0)
MCV: 87.6 fL (ref 80.0–100.0)
Platelets: 240 10*3/uL (ref 150–400)
RBC: 4.58 MIL/uL (ref 3.87–5.11)
RDW: 13.6 % (ref 11.5–15.5)
WBC: 6.9 10*3/uL (ref 4.0–10.5)
nRBC: 0 % (ref 0.0–0.2)

## 2021-01-03 LAB — BASIC METABOLIC PANEL
Anion gap: 10 (ref 5–15)
BUN: 13 mg/dL (ref 8–23)
CO2: 30 mmol/L (ref 22–32)
Calcium: 9.6 mg/dL (ref 8.9–10.3)
Chloride: 101 mmol/L (ref 98–111)
Creatinine, Ser: 0.82 mg/dL (ref 0.44–1.00)
GFR, Estimated: >60 mL/min (ref >60)
Glucose, Bld: 151 mg/dL — ABNORMAL HIGH (ref 70–99)
Potassium: 3.9 mmol/L (ref 3.5–5.1)
Sodium: 141 mmol/L (ref 135–145)

## 2021-01-03 LAB — SURGICAL PCR SCREEN
MRSA, PCR: NEGATIVE
Staphylococcus aureus: NEGATIVE

## 2021-01-03 LAB — SARS CORONAVIRUS 2 (TAT 6-24 HRS): SARS Coronavirus 2: NEGATIVE

## 2021-01-03 NOTE — Progress Notes (Signed)
PCP - Evlyn Kanner Cardiologist - Dr. Clifton James  PPM/ICD - denies  Chest x-ray - 11/29/2020 EKG - 11/28/2020 Stress Test - 2014 ECHO - 01/17/2020 Cardiac Cath - date unknown  Sleep Study - yes, 7-8 years ago CPAP - no cpap, prescribed oxygen instead  Fasting Blood Sugar - 148-178 Checks Blood Sugar twice a day  Patient instructed to hold all Aspirin, NSAID's, herbal medications, fish oil and vitamins 7 days prior to surgery.   ERAS Protcol -yes PRE-SURGERY Ensure or G2- water and instructions given  COVID TEST- 01/03/2021   Anesthesia review: yes, history of TAVR, see Fayrene Fearing note on 11/28/2020  Patient denies shortness of breath, fever, cough and chest pain at PAT appointment   All instructions explained to the patient, with a verbal understanding of the material. Patient agrees to go over the instructions while at home for a better understanding. Patient also instructed to self quarantine after being tested for COVID-19. The opportunity to ask questions was provided.

## 2021-01-03 NOTE — Progress Notes (Signed)
60 year old white female history of L4-5 stenosis/spondylolisthesis comes in for preop evaluation.  States that she continues have ongoing low back pain and bilateral lower extremity radiculopathy.  She is wanting to proceed with L4-5 LEFT POSTERIOR INTERBODY FUSION WITH DEPUY MPACT PEDICLE SCREWS, RODS AND CAGES, LOCAL AND ALLOGRAFT BONE GRAFT, VIVIGEN.  We received preop medical and cardiac clearances.  Today history and physical performed.  Review of systems negative.  Today all questions answered and she wishes to proceed.

## 2021-01-04 ENCOUNTER — Encounter (HOSPITAL_COMMUNITY): Payer: Self-pay | Admitting: Physician Assistant

## 2021-01-04 ENCOUNTER — Encounter (HOSPITAL_COMMUNITY): Payer: Self-pay | Admitting: Anesthesiology

## 2021-01-04 MED ORDER — BUPIVACAINE LIPOSOME 1.3 % IJ SUSP
20.0000 mL | Freq: Once | INTRAMUSCULAR | Status: DC
  Filled 2021-01-04: qty 20

## 2021-01-04 NOTE — H&P (Signed)
[name] is an 60 y.o. female.   Chief Complaint: Low back pain and bilateral lower extremity radiculopathy HPI: 60 year old white female history of L4-5 stenosis/spondylolisthesis comes in for preop evaluation.  States that she continues have ongoing low back pain and bilateral lower extremity radiculopathy.  She is wanting to proceed with L4-5 LEFT POSTERIOR INTERBODY FUSION WITH DEPUY MPACT PEDICLE SCREWS, RODS AND CAGES, LOCAL AND ALLOGRAFT BONE GRAFT, VIVIGEN.  We received preop medical and cardiac clearances.   Past Medical History:  Diagnosis Date  . Anemia   . Aortic valve stenosis, severe   . 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 Asthma   . Blood transfusion    2013[facility]  . Breast cancer in female [facility])    Right  . CAD (coronary artery disease)    Cath 2010 with DES x 1 RCA-- PT'S CARDIOLOGIST IS DR. Reeder  . Chronic diastolic congestive heart failure ([place])   . COPD (chronic obstructive pulmonary disease) ([place])   . Depression   . Diabetes mellitus DIAGNOSED IN2010   Controll s with diet  . Eczema    on back  . Heart murmur   . History of hiatal hernia   . Hyperlipidemia   . Hypertension   . Kidney stones   . Morbid obesity with body mass index of 60.0-69.9 in adult [facility])   . Myocardial infarction ([place])    PT THINKS SHE WAS DX WITH MI AT THE TIME OF HEART STENTING  . Neuromuscular disorder ([place])    bilateral hands  . Pulmonary embolism ([place]) 02/08/12   S/P RT TOTAL KNEE ON 02/03/12--ON 02/08/12--DEVELOPED ACUTE SOB AND CHEST PAIN--AND DIAGNOSED WITH  PULMONARY EMBOLUS AND PNEUMONIA  . Restless leg syndrome   . Sleep apnea    uses 3 liters O2 at night   . Uterine fibroid    NO PROBLEMS AT PRESENT FROM THE FIBROIDS-STATES SHE IS POST MENOPAUSAL-LAST MENSES 2010 EXCEPT FOR EPISODE THIS YR OF BLEEDING RELATED TO FIBROIDS.  Marland Kitchen Weakness    BOTH HANDS - S/P  BILATERAL CARPAL TUNNEL RELEASE--BUT STILL HAS WEAKNESS--OFTEN DROPS THINGS    Past Surgical History:  Procedure Laterality Date  . BREAST LUMPECTOMY Right 06/2018  . BREAST LUMPECTOMY WITH RADIOACTIVE SEED AND SENTINEL LYMPH NODE BIOPSY Right 07/19/2018   Procedure: BREAST LUMPECTOMY WITH RADIOACTIVE SEED AND SENTINEL LYMPH NODE BIOPSY;  Surgeon: Alphonsa Overall, MD;  Location: [place];  Service: General;  Laterality: Right;  . CARDIAC CATHETERIZATION    . CARDIAC VALVE REPLACEMENT     2017  . CARPAL TUNNEL RELEASE     Bilateral  . CHOLECYSTECTOMY    . CORONARY ANGIOPLASTY     2010 has stent in place  . CYSTOSCOPY W/ RETROGRADES Right 09/21/2013   Procedure: CYSTOSCOPY WITH RIGHT RETROGRADE PYELOGRAM RIGHT DOUBLE J STENT ;  Surgeon: Fredricka Bonine, MD;  Location[facility];  Service: Urology;  Laterality: Right;  . CYSTOSCOPY WITH URETEROSCOPY AND STENT PLACEMENT Right 10/25/2013   Procedure: CYSTOSCOPY RIGHT URETEROSCOPY HOLMIUM LASER LITHO AND STENT PLACEMENT;  Surgeon: Fredricka Bonine, MD;  Location[facility];  Service: Urology;  Laterality: Right;  . HERNIA REPAIR    . INTRAOPERATIVE TRANSESOPHAGEAL ECHOCARDIOGRAM N/A 12/12/2014   Procedure: INTRAOPERATIVE TRANSESOPHAGEAL ECHOCARDIOGRAM;  Surgeon: Burnell Blanks, MD;  Location: [place];  Service: Cardiovascular;  Laterality: N/A;  . JOINT  REPLACEMENT     bil total knees  . KNEE ARTHROPLASTY  02/03/2012   Procedure: COMPUTER ASSISTED TOTAL KNEE ARTHROPLASTY;  Surgeon: Mcarthur Rossetti, MD;  Location: [place];  Service: Orthopedics;  Laterality: Right;  Right total knee arthroplasty  . LEFT AND RIGHT HEART CATHETERIZATION WITH CORONARY ANGIOGRAM N/A 03/17/2013   Procedure: LEFT AND RIGHT HEART CATHETERIZATION WITH CORONARY ANGIOGRAM;  Surgeon: Burnell Blanks, MD;  Location[facility] C[facility] LAB;  Service: Cardiovascular;  Laterality: N/A;  . LEFT AND RIGHT HEART CATHETERIZATION WITH CORONARY/GRAFT ANGIOGRAM N/A 09/14/2014    Procedure: LEFT AND RIGHT HEART CATHETERIZATION WITH Beatrix Fetters;  Surgeon: Burnell Blanks, MD;  Location: [facility] C[facility] LAB;  Service: Cardiovascular;  Laterality: N/A;  . TEE WITHOUT CARDIOVERSION N/A 03/14/2013   Procedure: TRANSESOPHAGEAL ECHOCARDIOGRAM (TEE);  Surgeon: Lelon Perla, MD;  Location: [facility] ENDOSCOPY;  Service: Cardiovascular;  Laterality: N/A;  . TEE WITHOUT CARDIOVERSION N/A 11/14/2014   Procedure: TRANSESOPHAGEAL ECHOCARDIOGRAM (TEE);  Surgeon: Lelon Perla, MD;  Location: [facility] ENDOSCOPY;  Service: Cardiovascular;  Laterality: N/A;  . TONSILLECTOMY     maybe as a child- does not know  . TOTAL KNEE ARTHROPLASTY  09/10/2012   Procedure: TOTAL KNEE ARTHROPLASTY;  Surgeon: Mcarthur Rossetti, MD;  [facility];  Service: Orthopedics;  Laterality: Left;  . TOTAL KNEE REVISION Right 07/15/2013   Procedure: REVISION ARTHROPLASTY RIGHT KNEE;  Surgeon: Mcarthur Rossetti, MD;  Location[facility];  Service: Orthopedics;  Laterality: Right;  . TRANSCATHETER AORTIC VALVE REPLACEMENT, TRANSFEMORAL N/A 12/12/2014   Procedure: TRANSCATHETER AORTIC VALVE REPLACEMENT, TRANSFEMORAL;  Surgeon: Burnell Blanks, MD;  Location: [place];  Service: Cardiovascular;  Laterality: N/A;  . TRIGGER FINGER RELEASE  09/10/2012   Procedure: RELEASE TRIGGER FINGER/A-1 PULLEY;  Surgeon: Mcarthur Rossetti, MD;  [facility];  Service: Orthopedics;  Laterality: Right;  Right Ring Finger  . TUBAL LIGATION      Family History  Problem Relation Age of Onset  . Breast cancer Mother        stage IV at diagnosis  . Emphysema Mother        smoked  . Heart disease Mother   . COPD Father        smoked  . Asthma Father   . Heart disease Father   . Cancer Brother        Sinus   Social History:  reports that she quit smoking about 20 years ago. Her smoking use included cigarettes. She has a 45.00 pack-year smoking history. She has never used smokeless tobacco. She reports  current alcohol use. She reports that she does not use drugs.  Allergies: No Known Allergies  No medications prior to admission.    Results for orders placed or performed during the hospital encounter of 01/03/21 (from the past 48 hour(s))  Glucose, capillary     Status: Abnormal   Collection Time: 01/03/21 11:46 AM  Result Value Ref Range   Glucose-Capillary 158 (H) 70 - 99 mg/dL    Comment: Glucose reference range applies only to samples taken after fasting for at least 8 hours.  Surgical pcr screen     Status: None   Collection Time: 01/03/21 11:48 AM   Specimen: Nasal Mucosa; Nasal Swab  Result Value Ref Range   MRSA, PCR NEGATIVE NEGATIVE   Staphylococcus aureus NEGATIVE NEGATIVE    Comment: (NOTE) The Xpert SA Assay (FDA approved for NASAL specimens in patients 16 years of age and older), is one component  of a comprehensive surveillance program. It is not intended to diagnose infection nor to guide or monitor treatment. Performed at [facility], [place] [address], [place], [place] [address]   Type and screen [place]     Status: None   Collection Time: 01/03/21 11:53 AM  Result Value Ref Range   ABO/RH(D) A POS    Antibody Screen NEG    Sample Expiration 01/17/2021,2359    Extend sample reason      NO TRANSFUSIONS OR PREGNANCY IN THE PAST 3 MONTHS Performed at [facility], [place] [address] [place], [place] [id number]   Basic metabolic panel per protocol     Status: Abnormal   Collection Time: 01/03/21 12:30 PM  Result Value Ref Range   Sodium 141 135 - 145 mmol/L   Potassium 3.9 3.5 - 5.1 mmol/L   Chloride 101 98 - 111 mmol/L   CO2 30 22 - 32 mmol/L   Glucose, Bld 151 (H) 70 - 99 mg/dL    Comment: Glucose reference range applies only to samples taken after fasting for at least 8 hours.   BUN 13 8 - 23 mg/dL   Creatinine, Ser 0.82 0.44 - 1.00 mg/dL   Calcium 9.6 8.9 - 10.3 mg/dL   GFR, Estimated >60 >60 mL/min    Comment:  (NOTE) Calculated using the CKD-EPI Creatinine Equation (2021)    Anion gap 10 5 - 15    Comment: Performed at [place] [address] [place], [place] [address]  CBC per protocol     Status: None   Collection Time: 01/03/21 12:30 PM  Result Value Ref Range   WBC 6.9 4.0 - 10.5 K/uL   RBC 4.58 3.87 - 5.11 MIL/uL   Hemoglobin 13.6 12.0 - 15.0 g/dL   HCT 40.1 36.0 - 46.0 %   MCV 87.6 80.0 - 100.0 fL   MCH 29.7 26.0 - 34.0 pg   MCHC 33.9 30.0 - 36.0 g/dL   RDW 13.6 11.5 - 15.5 %   Platelets 240 150 - 400 K/uL   nRBC 0.0 0.0 - 0.2 %    Comment: Performed at [facility], [place] [address] [place], [place] [address]   No results found.  Review of Systems  Constitutional: Positive for activity change.  HENT: Negative.   Respiratory: Negative.   Cardiovascular: Negative.   Gastrointestinal: Negative.   Genitourinary: Negative.   Musculoskeletal: Positive for back pain.  Neurological: Positive for numbness.  Psychiatric/Behavioral: Negative.     Last menstrual period 02/03/2012. Physical Exam HENT:     Head: Normocephalic and atraumatic.  Eyes:     Extraocular Movements: Extraocular movements intact.     Pupils: Pupils are equal, round, and reactive to light.  Cardiovascular:     Heart sounds: Normal heart sounds.  Pulmonary:     Effort: Pulmonary effort is normal.     Breath sounds: Normal breath sounds.  Musculoskeletal:        General: Tenderness present.  Neurological:     Mental Status: She is alert and oriented to person, place, and time.  Psychiatric:        Mood and Affect: Mood normal.      Assessment/Plan L4-5 HNP/stenosis, low back pain and bilateral lower extremity radiculopathy  We will proceed with L4-5 LEFT POSTERIOR INTERBODY FUSION WITH DEPUY MPACT PEDICLE SCREWS, RODS AND CAGES, LOCAL AND ALLOGRAFT BONE GRAFT, VIVIGEN as scheduled. Surgical procedure discussed along with potential hospital  stay. All questions answered.  Benjiman Core, PA-C 01/04/2021, 1:28 PM

## 2021-01-04 NOTE — Anesthesia Preprocedure Evaluation (Deleted)
Anesthesia Evaluation    Airway        Dental   Pulmonary former smoker,           Cardiovascular hypertension,      Neuro/Psych    GI/Hepatic   Endo/Other  diabetes  Renal/GU      Musculoskeletal   Abdominal   Peds  Hematology   Anesthesia Other Findings   Reproductive/Obstetrics                             Anesthesia Physical Anesthesia Plan  ASA:   Anesthesia Plan:    Post-op Pain Management:    Induction:   PONV Risk Score and Plan:   Airway Management Planned:   Additional Equipment:   Intra-op Plan:   Post-operative Plan:   Informed Consent:   Plan Discussed with:   Anesthesia Plan Comments: (See recent PAT note by Karoline Caldwell, PA-C 11/28/20. Case postponed at that time due to COPD exacerbation. She was seen and treated by PCP with steroid and antibiotics and reported rapid improvement. Denied SOB at PAT appt 01/04/20. Preop labs unremarkable.  )        Anesthesia Quick Evaluation

## 2021-01-07 ENCOUNTER — Ambulatory Visit (HOSPITAL_COMMUNITY): Admission: RE | Admit: 2021-01-07 | Payer: Medicaid Other | Source: Home / Self Care | Admitting: Specialist

## 2021-01-07 ENCOUNTER — Encounter (HOSPITAL_COMMUNITY): Admission: RE | Payer: Self-pay | Source: Home / Self Care

## 2021-01-07 SURGERY — POSTERIOR LUMBAR FUSION 1 LEVEL
Anesthesia: General

## 2021-01-14 ENCOUNTER — Other Ambulatory Visit: Payer: Self-pay | Admitting: Oncology

## 2021-01-21 ENCOUNTER — Other Ambulatory Visit (HOSPITAL_COMMUNITY): Payer: Medicaid Other

## 2021-01-21 ENCOUNTER — Telehealth: Payer: Self-pay

## 2021-01-21 NOTE — Telephone Encounter (Signed)
Pt called and would like some medication for pain

## 2021-01-22 ENCOUNTER — Other Ambulatory Visit: Payer: Self-pay | Admitting: Specialist

## 2021-01-22 ENCOUNTER — Other Ambulatory Visit: Payer: Self-pay | Admitting: Internal Medicine

## 2021-01-22 DIAGNOSIS — E119 Type 2 diabetes mellitus without complications: Secondary | ICD-10-CM

## 2021-01-22 MED ORDER — HYDROCODONE-ACETAMINOPHEN 5-325 MG PO TABS: 1.0000 | ORAL_TABLET | Freq: Four times a day (QID) | ORAL | 0 refills | Status: DC | PRN

## 2021-01-22 NOTE — Telephone Encounter (Signed)
Rx for hydrocodone sent to her pharmacy.

## 2021-01-23 ENCOUNTER — Ambulatory Visit (HOSPITAL_COMMUNITY): Payer: Medicaid Other | Attending: Internal Medicine

## 2021-01-23 ENCOUNTER — Other Ambulatory Visit: Payer: Self-pay

## 2021-01-23 DIAGNOSIS — I350 Nonrheumatic aortic (valve) stenosis: Secondary | ICD-10-CM | POA: Insufficient documentation

## 2021-01-23 LAB — ECHOCARDIOGRAM COMPLETE
AR max vel: 1.43 cm2
AV Area VTI: 1.5 cm2
AV Area mean vel: 1.37 cm2
AV Mean grad: 15 mmHg
AV Peak grad: 25.2 mmHg
Ao pk vel: 2.51 m/s
Area-P 1/2: 2 cm2
S' Lateral: 3.1 cm

## 2021-01-23 MED ORDER — PERFLUTREN LIPID MICROSPHERE
1.0000 mL | INTRAVENOUS | Status: AC | PRN
  Administered 2021-01-23: 1 mL via INTRAVENOUS

## 2021-01-25 ENCOUNTER — Encounter: Payer: Self-pay | Admitting: Specialist

## 2021-01-25 ENCOUNTER — Ambulatory Visit (INDEPENDENT_AMBULATORY_CARE_PROVIDER_SITE_OTHER): Payer: Medicaid Other | Admitting: Specialist

## 2021-01-25 ENCOUNTER — Other Ambulatory Visit: Payer: Self-pay

## 2021-01-25 VITALS — BP 133/74 | HR 105 | Ht 63.0 in | Wt 307.0 lb

## 2021-01-25 DIAGNOSIS — M4316 Spondylolisthesis, lumbar region: Secondary | ICD-10-CM | POA: Diagnosis not present

## 2021-01-25 DIAGNOSIS — M5136 Other intervertebral disc degeneration, lumbar region: Secondary | ICD-10-CM | POA: Diagnosis not present

## 2021-01-25 DIAGNOSIS — M4807 Spinal stenosis, lumbosacral region: Secondary | ICD-10-CM

## 2021-01-25 DIAGNOSIS — M545 Low back pain, unspecified: Secondary | ICD-10-CM

## 2021-01-25 DIAGNOSIS — G8929 Other chronic pain: Secondary | ICD-10-CM

## 2021-01-25 DIAGNOSIS — M48061 Spinal stenosis, lumbar region without neurogenic claudication: Secondary | ICD-10-CM

## 2021-01-25 DIAGNOSIS — M7542 Impingement syndrome of left shoulder: Secondary | ICD-10-CM | POA: Diagnosis not present

## 2021-01-25 DIAGNOSIS — M25552 Pain in left hip: Secondary | ICD-10-CM

## 2021-01-25 DIAGNOSIS — M48062 Spinal stenosis, lumbar region with neurogenic claudication: Secondary | ICD-10-CM | POA: Diagnosis not present

## 2021-01-25 NOTE — Progress Notes (Signed)
Office Visit Note   Patient: [name]           Date of Birth: 02-25-1961           MRN: [id number] Visit Date: 01/25/2021              Requested by: [name], MD [address] [place],  [place] [address] PCP: [name], MD   Assessment & Plan: Visit Diagnoses:  1. Spondylolisthesis, lumbar region   2. Spinal stenosis of lumbar region with neurogenic claudication   3. Degenerative lumbar disc   4. Impingement syndrome of left shoulder   5. Spinal stenosis of lumbosacral region   6. Chronic low back pain, unspecified back pain laterality, unspecified whether sciatica present   7. Spinal stenosis of lumbar region without neurogenic claudication   8. Pain in left hip     Plan:Avoid bending, stooping and avoid lifting weights greater than 10 lbs. Avoid prolong standing and walking. Order for a new walker with wheels. Surgery scheduling secretary Kandice Hams, will call you in the next week to schedule for surgery.  Surgery recommended is a one level lumbar fusion left L4-5 this would be done with rods, screws and cages with local bone graft and allograft (donor bone graft). Take hydrocodone for for pain. Risk of surgery includes risk of infection 1 in100 patients, bleeding 1/2% chance you would need a transfusion. Risk to the nerves is one in 10,000. You will need to use a brace for 3 months and wean from the brace on the 4th month. Expect improved walking and standing tolerance. Expect relief of leg pain but numbness may persist depending on the length and degree of pressure that has been present. We will reschedule surgery, you have had 08/2020 myelogram and post myelogram CT scan so this is adequate and further MRI is not  Necessary.   Follow-Up Instructions: No follow-ups on file.   Orders:  No orders of the defined types were placed in this encounter.  No orders of the defined types were placed in this encounter.     Procedures: No  procedures performed   Clinical Data: No additional findings.   Subjective: Chief Complaint  Patient presents with  . Lower Back - Follow-up    Surgery has been scheduled for 02/11/21    60 year old female with history of spondylolisthesis and diabetes. Has neurgenic claudication , walking from lobby to upstair with SOB and could walk this but has to use electric cart to grocery shop. Bringing groceries in is painful and she has difficulty walking steps. She was to have surgery earlier this month but this was delayed due to decreased resources at the hospital due to a spike in [place] admissions. She is Ready to have surgery. Saw Benjiman Core PA-C earliear this month but I have not seen her in nearly 2.5 months. Pain is the same worse with prolong sitting and standing and walking. No bowel or bladder difficutly. Walking the right leg wants to give away, she has to stand for a Moment add allow the leg to improve then can resume walking.    Review of Systems   Objective: Vital Signs: BP 133/74 (BP Location: Left Arm, Patient Position: Sitting)   Pulse (!) 105   Ht 5\' 3"  (1.6 m)   Wt (!) 307 lb (139.3 kg)   LMP 02/03/2012   BMI 54.38 kg/m   Physical Exam  Ortho Exam  Specialty Comments:  No specialty comments available.  Imaging: No results found.   PMFS History: Patient Active Problem List   Diagnosis Date Noted  . URI with cough and congestion 12/04/2020  . Neuropathy of forearm, right 09/25/2020  . Shoulder pain, left 09/25/2020  . Tick bite 04/06/2020  . Stiffness of hand joint 06/09/2019  . Right arm numbness 06/09/2019  . Low back pain 05/02/2019  . Pain of left hip joint 08/09/2018  . Malignant neoplasm of upper-outer quadrant of right breast in female, estrogen receptor positive ([place]) 06/10/2018  . Screening for colon cancer 03/12/2018  . Screening for breast cancer 03/12/2018  . Restless leg syndrome 02/04/2018  . Complex atypical endometrial hyperplasia  10/22/2016  . Depression 08/06/2015  . Hypertension associated with diabetes ([place])   . Aortic stenosis   . Chronic diastolic congestive heart failure ([place])   . COPD (chronic obstructive pulmonary disease) ([place]) 09/21/2013  . Obstructive sleep apnea 09/21/2013  . Nocturnal hypoxemia 09/12/2013  . Abnormal CT scan, chest 05/12/2013  . Hyperlipidemia 11/30/2012  . History of pulmonary embolus (PE) 07/09/2012  . Normocytic anemia 02/08/2012  . Type 2 diabetes mellitus without complication, without long-term current use of insulin ([place]) 02/08/2012  . Asthma 02/08/2012  . GERD (gastroesophageal reflux disease)   . CAD (coronary artery disease) 12/08/2011   Past Medical History:  Diagnosis Date  . Anemia   . Aortic valve stenosis, severe   . 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 Asthma   . Blood transfusion    [facility]  . Breast cancer in female [facility])    Right  . CAD (coronary artery disease)    Cath 2010 with DES x 1 RCA-- PT'S CARDIOLOGIST IS DR. [place]  . Chronic diastolic congestive heart failure ([place])   . COPD (chronic obstructive pulmonary disease) ([place])   . Depression   . Diabetes mellitus DIAGNOSED IN2010   Controll s with diet  . Eczema    on back  . Heart murmur   . History of hiatal hernia   . Hyperlipidemia   . Hypertension   . Kidney stones   . Morbid obesity with body mass index of 60.0-69.9 in adult [facility])   . Myocardial infarction ([place])    PT THINKS SHE WAS DX WITH MI AT THE TIME OF HEART STENTING  . Neuromuscular disorder ([place])    bilateral hands  . Pulmonary embolism ([place]) 02/08/12   S/P RT TOTAL KNEE ON 02/03/12--ON 02/08/12--DEVELOPED ACUTE SOB AND CHEST PAIN--AND DIAGNOSED WITH  PULMONARY EMBOLUS AND PNEUMONIA  . Restless leg syndrome   . Sleep apnea    uses 3 liters O2 at night   . Uterine fibroid    NO PROBLEMS AT PRESENT FROM THE  FIBROIDS-STATES SHE IS POST MENOPAUSAL-LAST MENSES 2010 EXCEPT FOR EPISODE THIS YR OF BLEEDING RELATED TO FIBROIDS.  Marland Kitchen Weakness    BOTH HANDS - S/P BILATERAL CARPAL TUNNEL RELEASE--BUT STILL HAS WEAKNESS--OFTEN DROPS THINGS    Family History  Problem Relation Age of Onset  . Breast cancer Mother        stage IV at diagnosis  . Emphysema Mother        smoked  . Heart disease Mother   . COPD Father        smoked  . Asthma Father   . Heart disease Father   . Cancer Brother  Sinus    Past Surgical History:  Procedure Laterality Date  . BREAST LUMPECTOMY Right 06/2018  . BREAST LUMPECTOMY WITH RADIOACTIVE SEED AND SENTINEL LYMPH NODE BIOPSY Right 07/19/2018   Procedure: BREAST LUMPECTOMY WITH RADIOACTIVE SEED AND SENTINEL LYMPH NODE BIOPSY;  Surgeon: Alphonsa Overall, MD;  Location: [place];  Service: General;  Laterality: Right;  . CARDIAC CATHETERIZATION    . CARDIAC VALVE REPLACEMENT     2017  . CARPAL TUNNEL RELEASE     Bilateral  . CHOLECYSTECTOMY    . CORONARY ANGIOPLASTY     2010 has stent in place  . CYSTOSCOPY W/ RETROGRADES Right 09/21/2013   Procedure: CYSTOSCOPY WITH RIGHT RETROGRADE PYELOGRAM RIGHT DOUBLE J STENT ;  Surgeon: Fredricka Bonine, MD;  Location[facility];  Service: Urology;  Laterality: Right;  . CYSTOSCOPY WITH URETEROSCOPY AND STENT PLACEMENT Right 10/25/2013   Procedure: CYSTOSCOPY RIGHT URETEROSCOPY HOLMIUM LASER LITHO AND STENT PLACEMENT;  Surgeon: Fredricka Bonine, MD;  Location: [facility];  Service: Urology;  Laterality: Right;  . HERNIA REPAIR    . INTRAOPERATIVE TRANSESOPHAGEAL ECHOCARDIOGRAM N/A 12/12/2014   Procedure: INTRAOPERATIVE TRANSESOPHAGEAL ECHOCARDIOGRAM;  Surgeon: Burnell Blanks, MD;  Location[facility];  Service: Cardiovascular;  Laterality: N/A;  . JOINT REPLACEMENT     bil total knees  . KNEE ARTHROPLASTY  02/03/2012   Procedure: COMPUTER ASSISTED TOTAL KNEE ARTHROPLASTY;  Surgeon: Mcarthur Rossetti, MD;  Location:  [place];  Service: Orthopedics;  Laterality: Right;  Right total knee arthroplasty  . LEFT AND RIGHT HEART CATHETERIZATION WITH CORONARY ANGIOGRAM N/A 03/17/2013   Procedure: LEFT AND RIGHT HEART CATHETERIZATION WITH CORONARY ANGIOGRAM;  Surgeon: Burnell Blanks, MD;  Location[facility] CATH LAB;  Service: Cardiovascular;  Laterality: N/A;  . LEFT AND RIGHT HEART CATHETERIZATION WITH CORONARY/GRAFT ANGIOGRAM N/A 09/14/2014   Procedure: LEFT AND RIGHT HEART CATHETERIZATION WITH Beatrix Fetters;  Surgeon: Burnell Blanks, MD;  Location[facility] CATH LAB;  Service: Cardiovascular;  Laterality: N/A;  . TEE WITHOUT CARDIOVERSION N/A 03/14/2013   Procedure: TRANSESOPHAGEAL ECHOCARDIOGRAM (TEE);  Surgeon: Lelon Perla, MD;  Location: [facility] ENDOSCOPY;  Service: Cardiovascular;  Laterality: N/A;  . TEE WITHOUT CARDIOVERSION N/A 11/14/2014   Procedure: TRANSESOPHAGEAL ECHOCARDIOGRAM (TEE);  Surgeon: Lelon Perla, MD;  Location: [facility] ENDOSCOPY;  Service: Cardiovascular;  Laterality: N/A;  . TONSILLECTOMY     maybe as a child- does not know  . TOTAL KNEE ARTHROPLASTY  09/10/2012   Procedure: TOTAL KNEE ARTHROPLASTY;  Surgeon: Mcarthur Rossetti, MD;  [facility];  Service: Orthopedics;  Laterality: Left;  . TOTAL KNEE REVISION Right 07/15/2013   Procedure: REVISION ARTHROPLASTY RIGHT KNEE;  Surgeon: Mcarthur Rossetti, MD;  [facility];  Service: Orthopedics;  Laterality: Right;  . TRANSCATHETER AORTIC VALVE REPLACEMENT, TRANSFEMORAL N/A 12/12/2014   Procedure: TRANSCATHETER AORTIC VALVE REPLACEMENT, TRANSFEMORAL;  Surgeon: Burnell Blanks, MD;  Location: [place];  Service: Cardiovascular;  Laterality: N/A;  . TRIGGER FINGER RELEASE  09/10/2012   Procedure: RELEASE TRIGGER FINGER/A-1 PULLEY;  Surgeon: Mcarthur Rossetti, MD;  [facility];  Service: Orthopedics;  Laterality: Right;  Right Ring Finger  . TUBAL LIGATION     Social History   Occupational History  .  Occupation: Disabled  Tobacco Use  . Smoking status: Former Smoker    Packs/day: 1.50    Years: 30.00    Pack years: 45.00    Types: Cigarettes    Quit date: 12/29/2000    Years since quitting: 20.0  .  Smokeless tobacco: Never Used  Vaping Use  . Vaping Use: Never used  Substance and Sexual Activity  . Alcohol use: Yes    Comment: rarely  . Drug use: No  . Sexual activity: Not Currently    Birth control/protection: Surgical

## 2021-01-25 NOTE — Patient Instructions (Signed)
Plan:Avoid bending, stooping and avoid lifting weights greater than 10 lbs. Avoid prolong standing and walking. Order for a new walker with wheels. Surgery scheduling secretary Kandice Hams, will call you in the next week to schedule for surgery.  Surgery recommended is a one level lumbar fusion left L4-5 this would be done with rods, screws and cages with local bone graft and allograft (donor bone graft). Take hydrocodone for for pain. Risk of surgery includes risk of infection 1 in100 patients, bleeding 1/2% chance you would need a transfusion. Risk to the nerves is one in 10,000. You will need to use a brace for 3 months and wean from the brace on the 4th month. Expect improved walking and standing tolerance. Expect relief of leg pain but numbness may persist depending on the length and degree of pressure that has been present. We will reschedule surgery, you have had 08/2020 myelogram and post myelogram CT scan so this is adequate and further MRI is not  Necessary.

## 2021-01-29 ENCOUNTER — Other Ambulatory Visit: Payer: Self-pay

## 2021-01-29 DIAGNOSIS — Z419 Encounter for procedure for purposes other than remedying health state, unspecified: Secondary | ICD-10-CM | POA: Diagnosis not present

## 2021-01-29 NOTE — Progress Notes (Signed)
60 year old female history of L4-5 stenosis, back pain and lower extremity radiculopathy comes in for preop evaluation.  States that symptoms unchanged from previous visit.  She is want to proceed with L4-5 fusion as scheduled.  Patient states that she has a history of exertional dyspnea and a dry cough.  We received preop medical and cardiac clearances.  History and physical performed.  Surgical procedure discussed.  All questions answered.

## 2021-02-06 NOTE — Progress Notes (Signed)
Surgical Instructions    Your procedure is scheduled on Monday February 14th.  Report to [facility] Main Entrance "A" at 5:30 A.M., then check in with the Admitting office.  Call this number if you have problems the morning of surgery:  [phone]   If you have any questions prior to your surgery date call [phone]: Open Monday-Friday 8am-4pm    Remember:  Do not eat after midnight the night before your surgery  You may drink clear liquids until 4:30am the morning of your surgery.   Clear liquids allowed are: Water, Non-Citrus Juices (without pulp), Carbonated Beverages, Clear Tea, Black Coffee Only, and Gatorade   Please complete your PRE-SURGERY ENSURE that was provided to you by 4:30 AM the morning of surgery.  Please, if able, drink it in one setting. DO NOT SIP.   Take these medicines the morning of surgery with A SIP OF WATER   clopidogrel (PLAVIX) 75 MG tablet   DULoxetine (CYMBALTA) 60 MG capsule  Fluticasone-Umeclidin-Vilant (TRELEGY ELLIPTA)  (bring inhaler to hospital)  metoprolol succinate (TOPROL XL) 50 MG 24 hr tablet   IF NEEDED  acetaminophen (TYLENOL) 500 MG tablet  albuterol (PROVENTIL) (2.5 MG/3ML) 0.083% nebulizer solution   HYDROcodone-acetaminophen (NORCO/VICODIN) 5-325 MG tablet  nitroGLYCERIN (NITROSTAT) 0.4 MG SL tablet  PROAIR HFA 108 (90 Base) MCG/ACT inhaler (bring inhaler to hospital)  As of today, STOP taking any Aspirin (unless otherwise instructed by your surgeon) Aleve, Naproxen, Ibuprofen, Motrin, Advil, Goody's, BC's, all herbal medications, fish oil, and all vitamins.       WHAT DO I DO ABOUT MY DIABETES MEDICATION?   Marland Kitchen Do not take oral diabetes medicines (pills) the morning of surgery.  . THE DAY BEFORE SURGERY, take usual dose of _Victoza___insulin.       . THE MORNING OF SURGERY, do not  take Victoza____insulin.  . The day of surgery, do not take other diabetes injectables, including Byetta (exenatide), Bydureon (exenatide ER),  Victoza (liraglutide), or Trulicity (dulaglutide).  . If your CBG is greater than 220 mg/dL, you may take  of your sliding scale (correction) dose of insulin.   HOW TO MANAGE YOUR DIABETES BEFORE AND AFTER SURGERY  Why is it important to control my blood sugar before and after surgery? . Improving blood sugar levels before and after surgery helps healing and can limit problems. . A way of improving blood sugar control is eating a healthy diet by: o  Eating less sugar and carbohydrates o  Increasing activity/exercise o  Talking with your doctor about reaching your blood sugar goals . High blood sugars (greater than 180 mg/dL) can raise your risk of infections and slow your recovery, so you will need to focus on controlling your diabetes during the weeks before surgery. . Make sure that the doctor who takes care of your diabetes knows about your planned surgery including the date and location.  How do I manage my blood sugar before surgery? . Check your blood sugar at least 4 times a day, starting 2 days before surgery, to make sure that the level is not too high or low. . Check your blood sugar the morning of your surgery when you wake up and every 2 hours until you get to the Short Stay unit. o If your blood sugar is less than 70 mg/dL, you will need to treat for low blood sugar: - Do not take insulin. - Treat a low blood sugar (less than 70 mg/dL) with  cup of clear juice (cranberry or  apple), 4 glucose tablets, OR glucose gel. - Recheck blood sugar in 15 minutes after treatment (to make sure it is greater than 70 mg/dL). If your blood sugar is not greater than 70 mg/dL on recheck, call [phone] for further instructions. . Report your blood sugar to the short stay nurse when you get to Short Stay.  . If you are admitted to the hospital after surgery: o Your blood sugar will be checked by the staff and you will probably be given insulin after surgery (instead of oral diabetes  medicines) to make sure you have good blood sugar levels. o The goal for blood sugar control after surgery is 80-180 mg/dL.                 Do not wear jewelry, make up, or nail polish            Do not wear lotions, powders, perfumes, or deodorant.            Do not shave 48 hours prior to surgery.              Do not bring valuables to the hospital.            [facility] is not responsible for any belongings or valuables.  Do NOT Smoke (Tobacco/Vaping) or drink Alcohol 24 hours prior to your procedure If you use a CPAP at night, you may bring all equipment for your overnight stay.   Contacts, glasses, dentures or bridgework may not be worn into surgery, please bring cases for these belongings   For patients admitted to the hospital, discharge time will be determined by your treatment team.   Patients discharged the day of surgery will not be allowed to drive home, and someone needs to stay with them for 24 hours.    Special instructions:   [place]- Preparing For Surgery  Before surgery, you can play an important role. Because skin is not sterile, your skin needs to be as free of germs as possible. You can reduce the number of germs on your skin by washing with CHG (chlorahexidine gluconate) Soap before surgery.  CHG is an antiseptic cleaner which kills germs and bonds with the skin to continue killing germs even after washing.    Oral Hygiene is also important to reduce your risk of infection.  Remember - BRUSH YOUR TEETH THE MORNING OF SURGERY WITH YOUR REGULAR TOOTHPASTE  Please do not use if you have an allergy to CHG or antibacterial soaps. If your skin becomes reddened/irritated stop using the CHG.  Do not shave (including legs and underarms) for at least 48 hours prior to first CHG shower. It is OK to shave your face.  Please follow these instructions carefully.   1. Shower the NIGHT BEFORE SURGERY and the MORNING OF SURGERY  2. If you chose to wash your hair, wash your  hair first as usual with your normal shampoo.  3. After you shampoo, rinse your hair and body thoroughly to remove the shampoo.  4. Wash Face and genitals (private parts) with your normal soap.   5.  Shower the NIGHT BEFORE SURGERY and the MORNING OF SURGERY with CHG Soap.   6. Use CHG Soap as you would any other liquid soap. You can apply CHG directly to the skin and wash gently with a scrungie or a clean washcloth.   7. Apply the CHG Soap to your body ONLY FROM THE NECK DOWN.  Do not use on open wounds  or open sores. Avoid contact with your eyes, ears, mouth and genitals (private parts). Wash Face and genitals (private parts)  with your normal soap.   8. Wash thoroughly, paying special attention to the area where your surgery will be performed.  9. Thoroughly rinse your body with warm water from the neck down.  10. DO NOT shower/wash with your normal soap after using and rinsing off the CHG Soap.  11. Pat yourself dry with a CLEAN TOWEL.  12. Wear CLEAN PAJAMAS to bed the night before surgery  13. Place CLEAN SHEETS on your bed the night before your surgery  14. DO NOT SLEEP WITH PETS.   Day of Surgery: Wear Clean/Comfortable clothing the morning of surgery Do not apply any deodorants/lotions.   Remember to brush your teeth WITH YOUR REGULAR TOOTHPASTE.   Please read over the following fact sheets that you were given.

## 2021-02-07 ENCOUNTER — Encounter (HOSPITAL_COMMUNITY): Payer: Self-pay

## 2021-02-07 ENCOUNTER — Other Ambulatory Visit (HOSPITAL_COMMUNITY)
Admission: RE | Admit: 2021-02-07 | Discharge: 2021-02-07 | Disposition: A | Discharge status: Home / Self Care | Payer: Medicaid Other | Source: Ambulatory Visit | Attending: Specialist | Admitting: Specialist

## 2021-02-07 ENCOUNTER — Other Ambulatory Visit: Payer: Self-pay

## 2021-02-07 ENCOUNTER — Encounter (HOSPITAL_COMMUNITY)
Admission: RE | Admit: 2021-02-07 | Discharge: 2021-02-07 | Disposition: A | Discharge status: Home / Self Care | Payer: Medicaid Other | Source: Ambulatory Visit | Attending: Specialist | Admitting: Specialist

## 2021-02-07 DIAGNOSIS — Z20822 Contact with and (suspected) exposure to covid-19: Secondary | ICD-10-CM | POA: Insufficient documentation

## 2021-02-07 DIAGNOSIS — Z01812 Encounter for preprocedural laboratory examination: Secondary | ICD-10-CM | POA: Insufficient documentation

## 2021-02-07 HISTORY — DX: Personal history of urinary calculi: Z87.442

## 2021-02-07 HISTORY — DX: Headache, unspecified: R51.9

## 2021-02-07 HISTORY — DX: Dyspnea, unspecified: R06.00

## 2021-02-07 LAB — TYPE AND SCREEN
ABO/RH(D): A POS
Antibody Screen: NEGATIVE

## 2021-02-07 LAB — SURGICAL PCR SCREEN
MRSA, PCR: NEGATIVE
Staphylococcus aureus: NEGATIVE

## 2021-02-07 LAB — GLUCOSE, CAPILLARY: Glucose-Capillary: 154 mg/dL — ABNORMAL HIGH (ref 70–99)

## 2021-02-07 LAB — SARS CORONAVIRUS 2 (TAT 6-24 HRS): SARS Coronavirus 2: NEGATIVE

## 2021-02-07 NOTE — Progress Notes (Signed)
Surgical Instructions    Your procedure is scheduled on Monday February 14th.  Report to [facility] Main Entrance "A" at 5:30 A.M., then check in with the Admitting office.  Call this number if you have problems the morning of surgery:  [phone]   If you have any questions prior to your surgery date call [phone]: Open Monday-Friday 8am-4pm    Remember:  Do not eat after midnight the night before your surgery  You may drink clear liquids until 4:30am the morning of your surgery.   Clear liquids allowed are: Water, Non-Citrus Juices (without pulp), Carbonated Beverages, Clear Tea, Black Coffee Only, and Gatorade     Please complete your bottle of water that was provided to you by 4:30 AM the morning of surgery.  Please, if able, drink it in one setting. DO NOT SIP.   Take these medicines the morning of surgery with A SIP OF WATER     DULoxetine (CYMBALTA) 60 MG capsule  Fluticasone-Umeclidin-Vilant (TRELEGY ELLIPTA)  (bring inhaler to hospital)  metoprolol succinate (TOPROL XL) 50 MG 24 hr tablet  Exemestane (aromasin)  IF NEEDED  acetaminophen (TYLENOL) 500 MG tablet  albuterol (PROVENTIL) (2.5 MG/3ML) 0.083% nebulizer solution   HYDROcodone-acetaminophen (NORCO/VICODIN) 5-325 MG tablet  nitroGLYCERIN (NITROSTAT) 0.4 MG SL tablet  PROAIR HFA 108 (90 Base) MCG/ACT inhaler (bring inhaler to hospital)  As of today, STOP taking any Aspirin (unless otherwise instructed by your surgeon) Aleve, Naproxen, Ibuprofen, Motrin, Advil, Goody's, BC's, all herbal medications, fish oil, and all vitamins.       WHAT DO I DO ABOUT MY DIABETES MEDICATION?   Marland Kitchen Do not take oral diabetes medicines (pills) the morning of surgery.  . THE DAY BEFORE SURGERY, take usual dose of _Victoza___insulin.       . THE MORNING OF SURGERY, do not  take Victoza____insulin.  . The day of surgery, do not take other diabetes injectables, including Byetta (exenatide), Bydureon (exenatide ER), Victoza  (liraglutide), or Trulicity (dulaglutide).     HOW TO MANAGE YOUR DIABETES BEFORE AND AFTER SURGERY  Why is it important to control my blood sugar before and after surgery? . Improving blood sugar levels before and after surgery helps healing and can limit problems. . A way of improving blood sugar control is eating a healthy diet by: o  Eating less sugar and carbohydrates o  Increasing activity/exercise o  Talking with your doctor about reaching your blood sugar goals . High blood sugars (greater than 180 mg/dL) can raise your risk of infections and slow your recovery, so you will need to focus on controlling your diabetes during the weeks before surgery. . Make sure that the doctor who takes care of your diabetes knows about your planned surgery including the date and location.  How do I manage my blood sugar before surgery? . Check your blood sugar at least 4 times a day, starting 2 days before surgery, to make sure that the level is not too high or low. . Check your blood sugar the morning of your surgery when you wake up and every 2 hours until you get to the Short Stay unit. o If your blood sugar is less than 70 mg/dL, you will need to treat for low blood sugar: - Do not take insulin. - Treat a low blood sugar (less than 70 mg/dL) with  cup of clear juice (cranberry or apple), 4 glucose tablets, OR glucose gel. - Recheck blood sugar in 15 minutes after treatment (to make sure it  is greater than 70 mg/dL). If your blood sugar is not greater than 70 mg/dL on recheck, call [phone] for further instructions. . Report your blood sugar to the short stay nurse when you get to Short Stay.  . If you are admitted to the hospital after surgery: o Your blood sugar will be checked by the staff and you will probably be given insulin after surgery (instead of oral diabetes medicines) to make sure you have good blood sugar levels. o The goal for blood sugar control after surgery is 80-180  mg/dL.                 Do not wear jewelry, make up, or nail polish            Do not wear lotions, powders, perfumes, or deodorant.            Do not shave 48 hours prior to surgery.              Do not bring valuables to the hospital.            [facility] is not responsible for any belongings or valuables.  Do NOT Smoke (Tobacco/Vaping) or drink Alcohol 24 hours prior to your procedure If you use a CPAP at night, you may bring all equipment for your overnight stay.   Contacts, glasses, dentures or bridgework may not be worn into surgery, please bring cases for these belongings   For patients admitted to the hospital, discharge time will be determined by your treatment team.   Patients discharged the day of surgery will not be allowed to drive home, and someone needs to stay with them for 24 hours.    Special instructions:   [facility]- Preparing For Surgery  Before surgery, you can play an important role. Because skin is not sterile, your skin needs to be as free of germs as possible. You can reduce the number of germs on your skin by washing with CHG (chlorahexidine gluconate) Soap before surgery.  CHG is an antiseptic cleaner which kills germs and bonds with the skin to continue killing germs even after washing.    Oral Hygiene is also important to reduce your risk of infection.  Remember - BRUSH YOUR TEETH THE MORNING OF SURGERY WITH YOUR REGULAR TOOTHPASTE  Please do not use if you have an allergy to CHG or antibacterial soaps. If your skin becomes reddened/irritated stop using the CHG.  Do not shave (including legs and underarms) for at least 48 hours prior to first CHG shower. It is OK to shave your face.  Please follow these instructions carefully.   1. Shower the NIGHT BEFORE SURGERY and the MORNING OF SURGERY  2. If you chose to wash your hair, wash your hair first as usual with your normal shampoo.  3. After you shampoo, rinse your hair and body thoroughly to remove  the shampoo.  4. Wash Face and genitals (private parts) with your normal soap.   5.  Shower the NIGHT BEFORE SURGERY and the MORNING OF SURGERY with CHG Soap.   6. Use CHG Soap as you would any other liquid soap. You can apply CHG directly to the skin and wash gently with a scrungie or a clean washcloth.   7. Apply the CHG Soap to your body ONLY FROM THE NECK DOWN.  Do not use on open wounds or open sores. Avoid contact with your eyes, ears, mouth and genitals (private parts). Wash Face and genitals (private parts)  with your normal soap.   8. Wash thoroughly, paying special attention to the area where your surgery will be performed.  9. Thoroughly rinse your body with warm water from the neck down.  10. DO NOT shower/wash with your normal soap after using and rinsing off the CHG Soap.  11. Pat yourself dry with a CLEAN TOWEL.  12. Wear CLEAN PAJAMAS to bed the night before surgery  13. Place CLEAN SHEETS on your bed the night before your surgery  14. DO NOT SLEEP WITH PETS.   Day of Surgery: Wear Clean/Comfortable clothing the morning of surgery Do not apply any deodorants/lotions.   Remember to brush your teeth WITH YOUR REGULAR TOOTHPASTE.   Please read over the following fact sheets that you were given.

## 2021-02-07 NOTE — Progress Notes (Addendum)
PCP - Dr. [name]  Cardiologist - Dr. Angelena Form  Chest x-ray - na  EKG - 11/28/21  Stress Test - 2014  ECHO - 01/23/21  Cardiac Cath -   Sleep Study - 2014 CPAP - no- oxygen  LABS-CBC, BMP, T/S, A1C  ASA- stopped 02/05/21 Plavix - stopped 02/05/21  ERAS-yes  HA1C-02/07/21 Fasting Blood Sugar - 120's Checks Blood Sugar _2___ times a day  Anesthesia-  Pt denies having chest pain, sob, or fever at this time. All instructions explained to the pt, with a verbal understanding of the material. Pt agrees to go over the instructions while at home for a better understanding. Pt also instructed to self quarantine after being tested for COVID-19. The opportunity to ask questions was provided.  The calf measurement for TED Hose for [name] is greater than the sizes that are available in the hospital.  I sent a staff message to Benjiman Core, PA-C

## 2021-02-07 NOTE — Progress Notes (Signed)
Surgical Instructions    Your procedure is scheduled on Monday February 14th.  Report to [facility] Main Entrance "A" at 5:30 A.M., then check in with the Admitting office.  Call this number if you have problems the morning of surgery:  [phone]   If you have any questions prior to your surgery date call [phone]: Open Monday-Friday 8am-4pm    Remember:  Do not eat after midnight the night before your surgery  You may drink clear liquids until 4:30am the morning of your surgery.   Clear liquids allowed are: Water, Non-Citrus Juices (without pulp), Carbonated Beverages, Clear Tea, Black Coffee Only, and Gatorade     Please complete your bottle of water that was provided to you by 4:30 AM the morning of surgery.  Please, if able, drink it in one setting. DO NOT SIP.   Take these medicines the morning of surgery with A SIP OF WATER     DULoxetine (CYMBALTA) 60 MG capsule  Fluticasone-Umeclidin-Vilant (TRELEGY ELLIPTA)  (bring inhaler to hospital)  metoprolol succinate (TOPROL XL) 50 MG 24 hr tablet  Exemestane (aromasin)  IF NEEDED  acetaminophen (TYLENOL) 500 MG tablet  albuterol (PROVENTIL) (2.5 MG/3ML) 0.083% nebulizer solution   HYDROcodone-acetaminophen (NORCO/VICODIN) 5-325 MG tablet  nitroGLYCERIN (NITROSTAT) 0.4 MG SL tablet  PROAIR HFA 108 (90 Base) MCG/ACT inhaler (bring inhaler to hospital)  As of today, STOP taking any Aspirin (unless otherwise instructed by your surgeon) Aleve, Naproxen, Ibuprofen, Motrin, Advil, Goody's, BC's, all herbal medications, fish oil, and all vitamins.      Follow Dr Otho Ket instructions regarding Plaivx.  WHAT DO I DO ABOUT MY DIABETES MEDICATION?  Marland Kitchen Do not take oral diabetes medicines (pills) the morning of surgery.  . THE DAY BEFORE SURGERY, take usual dose of _Victoza___insulin.      . THE MORNING OF SURGERY, do not  take Victoza____insulin.  . The day of surgery, do not take other diabetes injectables, including Byetta  (exenatide), Bydureon (exenatide ER), Victoza (liraglutide), or Trulicity (dulaglutide).    HOW TO MANAGE YOUR DIABETES BEFORE AND AFTER SURGERY  Why is it important to control my blood sugar before and after surgery? . Improving blood sugar levels before and after surgery helps healing and can limit problems. . A way of improving blood sugar control is eating a healthy diet by: o  Eating less sugar and carbohydrates o  Increasing activity/exercise o  Talking with your doctor about reaching your blood sugar goals . High blood sugars (greater than 180 mg/dL) can raise your risk of infections and slow your recovery, so you will need to focus on controlling your diabetes during the weeks before surgery. . Make sure that the doctor who takes care of your diabetes knows about your planned surgery including the date and location.  How do I manage my blood sugar before surgery? . Check your blood sugar at least 4 times a day, starting 2 days before surgery, to make sure that the level is not too high or low. . Check your blood sugar the morning of your surgery when you wake up and every 2 hours until you get to the Short Stay unit. o If your blood sugar is less than 70 mg/dL, you will need to treat for low blood sugar: - Do not take insulin. - Treat a low blood sugar (less than 70 mg/dL) with  cup of clear juice (cranberry or apple), 4 glucose tablets, OR glucose gel. - Recheck blood sugar in 15 minutes after treatment (to  make sure it is greater than 70 mg/dL). If your blood sugar is not greater than 70 mg/dL on recheck, call [phone] for further instructions. . Report your blood sugar to the short stay nurse when you get to Short Stay.  . If you are admitted to the hospital after surgery: o Your blood sugar will be checked by the staff and you will probably be given insulin after surgery (instead of oral diabetes medicines) to make sure you have good blood sugar levels. o The goal for  blood sugar control after surgery is 80-180 mg/dL.                 Do not wear jewelry, make up, or nail polish            Do not wear lotions, powders, perfumes, or deodorant.            Do not shave 48 hours prior to surgery.              Do not bring valuables to the hospital.            [facility] is not responsible for any belongings or valuables.  Do NOT Smoke (Tobacco/Vaping) or drink Alcohol 24 hours prior to your procedure If you use a CPAP at night, you may bring all equipment for your overnight stay.   Contacts, glasses, dentures or bridgework may not be worn into surgery, please bring cases for these belongings   For patients admitted to the hospital, discharge time will be determined by your treatment team.   Patients discharged the day of surgery will not be allowed to drive home, and someone needs to stay with them for 24 hours.    Special instructions:   [facility]- Preparing For Surgery  Before surgery, you can play an important role. Because skin is not sterile, your skin needs to be as free of germs as possible. You can reduce the number of germs on your skin by washing with CHG (chlorahexidine gluconate) Soap before surgery.  CHG is an antiseptic cleaner which kills germs and bonds with the skin to continue killing germs even after washing.    Oral Hygiene is also important to reduce your risk of infection.  Remember - BRUSH YOUR TEETH THE MORNING OF SURGERY WITH YOUR REGULAR TOOTHPASTE  Please do not use if you have an allergy to CHG or antibacterial soaps. If your skin becomes reddened/irritated stop using the CHG.  Do not shave (including legs and underarms) for at least 48 hours prior to first CHG shower. It is OK to shave your face.  Please follow these instructions carefully.   1. Shower the NIGHT BEFORE SURGERY and the MORNING OF SURGERY  2. If you chose to wash your hair, wash your hair first as usual with your normal shampoo.  3. After you shampoo,  rinse your hair and body thoroughly to remove the shampoo.  4. Wash Face and genitals (private parts) with your normal soap.   5.  Shower the NIGHT BEFORE SURGERY and the MORNING OF SURGERY with CHG Soap.   6. Use CHG Soap as you would any other liquid soap. You can apply CHG directly to the skin and wash gently with a scrungie or a clean washcloth.   7. Apply the CHG Soap to your body ONLY FROM THE NECK DOWN.  Do not use on open wounds or open sores. Avoid contact with your eyes, ears, mouth and genitals (private parts). Wash Face and  genitals (private parts)  with your normal soap.   8. Wash thoroughly, paying special attention to the area where your surgery will be performed.  9. Thoroughly rinse your body with warm water from the neck down.  10. DO NOT shower/wash with your normal soap after using and rinsing off the CHG Soap.  11. Pat yourself dry with a CLEAN TOWEL.  12. Wear CLEAN PAJAMAS to bed the night before surgery  13. Place CLEAN SHEETS on your bed the night before your surgery  14. DO NOT SLEEP WITH PETS.   Day of Surgery: Wear Clean/Comfortable clothing the morning of surgery Do not apply any deodorants/lotions.   Remember to brush your teeth WITH YOUR REGULAR TOOTHPASTE.   Please read over the following fact sheets that you were given.

## 2021-02-08 MED ORDER — DEXTROSE 5 % IV SOLN
3.0000 g | INTRAVENOUS | Status: AC
  Administered 2021-02-11: 3 g via INTRAVENOUS
  Filled 2021-02-08 (×3): qty 3000

## 2021-02-10 NOTE — Anesthesia Preprocedure Evaluation (Addendum)
Anesthesia Evaluation  Patient identified by MRN, date of birth, ID band Patient awake    Reviewed: Allergy & Precautions, NPO status , Patient's Chart, lab work & pertinent test results, reviewed documented beta blocker date and time   Airway Mallampati: III  TM Distance: >3 FB Neck ROM: Full    Dental  (+) Edentulous Upper, Edentulous Lower   Pulmonary shortness of breath and with exertion, asthma , sleep apnea and Oxygen sleep apnea , COPD (3L O2 at night),  COPD inhaler and oxygen dependent, former smoker, PE   Pulmonary exam normal breath sounds clear to auscultation       Cardiovascular hypertension, Pt. on medications and Pt. on home beta blockers + CAD, + Past MI, + Cardiac Stents (2010) and +CHF  Normal cardiovascular exam+ Valvular Problems/Murmurs (s/p TAVR 2015) AS  Rhythm:Regular Rate:Normal  TTE 12/2020 1. Left ventricular ejection fraction, by estimation, is 60 to 65%. The  left ventricle has normal function. The left ventricle has no regional  wall motion abnormalities. There is mild concentric left ventricular  hypertrophy. Left ventricular diastolic  parameters are indeterminate.  2. Right ventricular systolic function is normal. The right ventricular  size is normal.  3. The mitral valve is abnormal. No evidence of mitral valve  regurgitation. Mild mitral stenosis. The mean mitral valve gradient is 4.0  mmHg with average heart rate of 77 bpm.  4. The aortic valve has been repaired/replaced. Aortic valve  regurgitation is not visualized. 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 Procedure Date: 12/12/2014. Aortic  valve mean gradient measures 15.0 mmHg.  5. The inferior vena cava is normal in size with greater than 50%  respiratory variability, suggesting right atrial pressure of 3 mmHg   Neuro/Psych  Headaches, PSYCHIATRIC DISORDERS Depression    GI/Hepatic Neg  liver ROS, GERD  Controlled and Medicated,  Endo/Other  diabetes, Type 2Morbid obesity (BMI 55)  Renal/GU negative Renal ROS  negative genitourinary   Musculoskeletal  (+) Arthritis ,   Abdominal   Peds  Hematology  (+) Blood dyscrasia (on plavix, last dose 02/05/21), ,   Anesthesia Other Findings   Reproductive/Obstetrics                            Anesthesia Physical Anesthesia Plan  ASA: III  Anesthesia Plan: General   Post-op Pain Management:    Induction: Intravenous  PONV Risk Score and Plan: 3 and Midazolam, Dexamethasone and Ondansetron  Airway Management Planned: Oral ETT  Additional Equipment:   Intra-op Plan:   Post-operative Plan: Extubation in OR  Informed Consent: I have reviewed the patients History and Physical, chart, labs and discussed the procedure including the risks, benefits and alternatives for the proposed anesthesia with the patient or authorized representative who has indicated his/her understanding and acceptance.     Dental advisory given  Plan Discussed with: CRNA  Anesthesia Plan Comments:         Anesthesia Quick Evaluation

## 2021-02-11 ENCOUNTER — Inpatient Hospital Stay (HOSPITAL_COMMUNITY)
Admission: RE | Admit: 2021-02-11 | Discharge: 2021-02-13 | DRG: 454 | Disposition: A | Discharge status: Home Health | Payer: Medicaid Other | Attending: Specialist | Admitting: Specialist

## 2021-02-11 ENCOUNTER — Ambulatory Visit (HOSPITAL_COMMUNITY): Payer: Medicaid Other | Admitting: Anesthesiology

## 2021-02-11 ENCOUNTER — Ambulatory Visit (HOSPITAL_COMMUNITY)
Admission: RE | Disposition: A | Discharge status: Home Health | Payer: Self-pay | Source: Home / Self Care | Attending: Specialist

## 2021-02-11 ENCOUNTER — Ambulatory Visit (HOSPITAL_COMMUNITY): Payer: Medicaid Other | Admitting: Physician Assistant

## 2021-02-11 ENCOUNTER — Other Ambulatory Visit: Payer: Self-pay

## 2021-02-11 ENCOUNTER — Encounter (HOSPITAL_COMMUNITY): Payer: Self-pay | Admitting: Specialist

## 2021-02-11 ENCOUNTER — Ambulatory Visit (HOSPITAL_COMMUNITY): Payer: Medicaid Other

## 2021-02-11 DIAGNOSIS — Z7982 Long term (current) use of aspirin: Secondary | ICD-10-CM

## 2021-02-11 DIAGNOSIS — G4730 Sleep apnea, unspecified: Secondary | ICD-10-CM | POA: Diagnosis present

## 2021-02-11 DIAGNOSIS — Z825 Family history of asthma and other chronic lower respiratory diseases: Secondary | ICD-10-CM

## 2021-02-11 DIAGNOSIS — Z86711 Personal history of pulmonary embolism: Secondary | ICD-10-CM

## 2021-02-11 DIAGNOSIS — D62 Acute posthemorrhagic anemia: Secondary | ICD-10-CM | POA: Diagnosis not present

## 2021-02-11 DIAGNOSIS — I252 Old myocardial infarction: Secondary | ICD-10-CM

## 2021-02-11 DIAGNOSIS — Z7902 Long term (current) use of antithrombotics/antiplatelets: Secondary | ICD-10-CM

## 2021-02-11 DIAGNOSIS — D259 Leiomyoma of uterus, unspecified: Secondary | ICD-10-CM | POA: Diagnosis present

## 2021-02-11 DIAGNOSIS — G2581 Restless legs syndrome: Secondary | ICD-10-CM | POA: Diagnosis present

## 2021-02-11 DIAGNOSIS — F32A Depression, unspecified: Secondary | ICD-10-CM | POA: Diagnosis present

## 2021-02-11 DIAGNOSIS — E785 Hyperlipidemia, unspecified: Secondary | ICD-10-CM | POA: Diagnosis present

## 2021-02-11 DIAGNOSIS — M4316 Spondylolisthesis, lumbar region: Secondary | ICD-10-CM | POA: Diagnosis not present

## 2021-02-11 DIAGNOSIS — E119 Type 2 diabetes mellitus without complications: Secondary | ICD-10-CM | POA: Diagnosis present

## 2021-02-11 DIAGNOSIS — Z96653 Presence of artificial knee joint, bilateral: Secondary | ICD-10-CM | POA: Diagnosis present

## 2021-02-11 DIAGNOSIS — Z7951 Long term (current) use of inhaled steroids: Secondary | ICD-10-CM

## 2021-02-11 DIAGNOSIS — Z6841 Body Mass Index (BMI) 40.0 and over, adult: Secondary | ICD-10-CM

## 2021-02-11 DIAGNOSIS — Z803 Family history of malignant neoplasm of breast: Secondary | ICD-10-CM

## 2021-02-11 DIAGNOSIS — Z87891 Personal history of nicotine dependence: Secondary | ICD-10-CM

## 2021-02-11 DIAGNOSIS — Z79899 Other long term (current) drug therapy: Secondary | ICD-10-CM

## 2021-02-11 DIAGNOSIS — Z8249 Family history of ischemic heart disease and other diseases of the circulatory system: Secondary | ICD-10-CM

## 2021-02-11 DIAGNOSIS — M48062 Spinal stenosis, lumbar region with neurogenic claudication: Secondary | ICD-10-CM | POA: Diagnosis not present

## 2021-02-11 DIAGNOSIS — K219 Gastro-esophageal reflux disease without esophagitis: Secondary | ICD-10-CM | POA: Diagnosis not present

## 2021-02-11 DIAGNOSIS — Z853 Personal history of malignant neoplasm of breast: Secondary | ICD-10-CM

## 2021-02-11 DIAGNOSIS — I5032 Chronic diastolic (congestive) heart failure: Secondary | ICD-10-CM | POA: Diagnosis present

## 2021-02-11 DIAGNOSIS — L309 Dermatitis, unspecified: Secondary | ICD-10-CM | POA: Diagnosis present

## 2021-02-11 DIAGNOSIS — I2510 Atherosclerotic heart disease of native coronary artery without angina pectoris: Secondary | ICD-10-CM | POA: Diagnosis present

## 2021-02-11 DIAGNOSIS — Z9981 Dependence on supplemental oxygen: Secondary | ICD-10-CM

## 2021-02-11 DIAGNOSIS — M4326 Fusion of spine, lumbar region: Secondary | ICD-10-CM | POA: Diagnosis not present

## 2021-02-11 DIAGNOSIS — Z955 Presence of coronary angioplasty implant and graft: Secondary | ICD-10-CM

## 2021-02-11 DIAGNOSIS — Z981 Arthrodesis status: Secondary | ICD-10-CM

## 2021-02-11 DIAGNOSIS — Z419 Encounter for procedure for purposes other than remedying health state, unspecified: Secondary | ICD-10-CM

## 2021-02-11 DIAGNOSIS — I110 Hypertensive heart disease with heart failure: Secondary | ICD-10-CM | POA: Diagnosis present

## 2021-02-11 DIAGNOSIS — J449 Chronic obstructive pulmonary disease, unspecified: Secondary | ICD-10-CM | POA: Diagnosis present

## 2021-02-11 HISTORY — PX: BACK SURGERY: SHX140

## 2021-02-11 LAB — GLUCOSE, CAPILLARY
Glucose-Capillary: 203 mg/dL — ABNORMAL HIGH (ref 70–99)
Glucose-Capillary: 169 mg/dL — ABNORMAL HIGH (ref 70–99)
Glucose-Capillary: 204 mg/dL — ABNORMAL HIGH (ref 70–99)
Glucose-Capillary: 248 mg/dL — ABNORMAL HIGH (ref 70–99)
Glucose-Capillary: 222 mg/dL — ABNORMAL HIGH (ref 70–99)

## 2021-02-11 LAB — COMPREHENSIVE METABOLIC PANEL
ALT: 16 U/L (ref 0–44)
AST: 17 U/L (ref 15–41)
Albumin: 3.5 g/dL (ref 3.5–5.0)
Alkaline Phosphatase: 66 U/L (ref 38–126)
Anion gap: 11 (ref 5–15)
BUN: 14 mg/dL (ref 8–23)
CO2: 28 mmol/L (ref 22–32)
Calcium: 9.4 mg/dL (ref 8.9–10.3)
Chloride: 100 mmol/L (ref 98–111)
Creatinine, Ser: 0.96 mg/dL (ref 0.44–1.00)
GFR, Estimated: >60 mL/min (ref >60)
Glucose, Bld: 187 mg/dL — ABNORMAL HIGH (ref 70–99)
Potassium: 4 mmol/L (ref 3.5–5.1)
Sodium: 139 mmol/L (ref 135–145)
Total Bilirubin: 1.2 mg/dL (ref 0.3–1.2)
Total Protein: 6.8 g/dL (ref 6.5–8.1)

## 2021-02-11 LAB — CBC
HCT: 40.6 % (ref 36.0–46.0)
Hemoglobin: 13.8 g/dL (ref 12.0–15.0)
MCH: 30 pg (ref 26.0–34.0)
MCHC: 34 g/dL (ref 30.0–36.0)
MCV: 88.3 fL (ref 80.0–100.0)
Platelets: 191 10*3/uL (ref 150–400)
RBC: 4.6 MIL/uL (ref 3.87–5.11)
RDW: 13.1 % (ref 11.5–15.5)
WBC: 7.3 10*3/uL (ref 4.0–10.5)
nRBC: 0 % (ref 0.0–0.2)

## 2021-02-11 IMAGING — RF DG C-ARM 1-60 MIN
1 series · 4 of 4 positions shown · non-contrast
Comparison: [DATE]

CLINICAL DATA: L4-5 fusion

EXAM:
LUMBAR SPINE - COMPLETE 4+ VIEW; DG C-ARM 1-60 MIN

[Series 1: run · 4 of 4 slices shown]
[im 1/4]
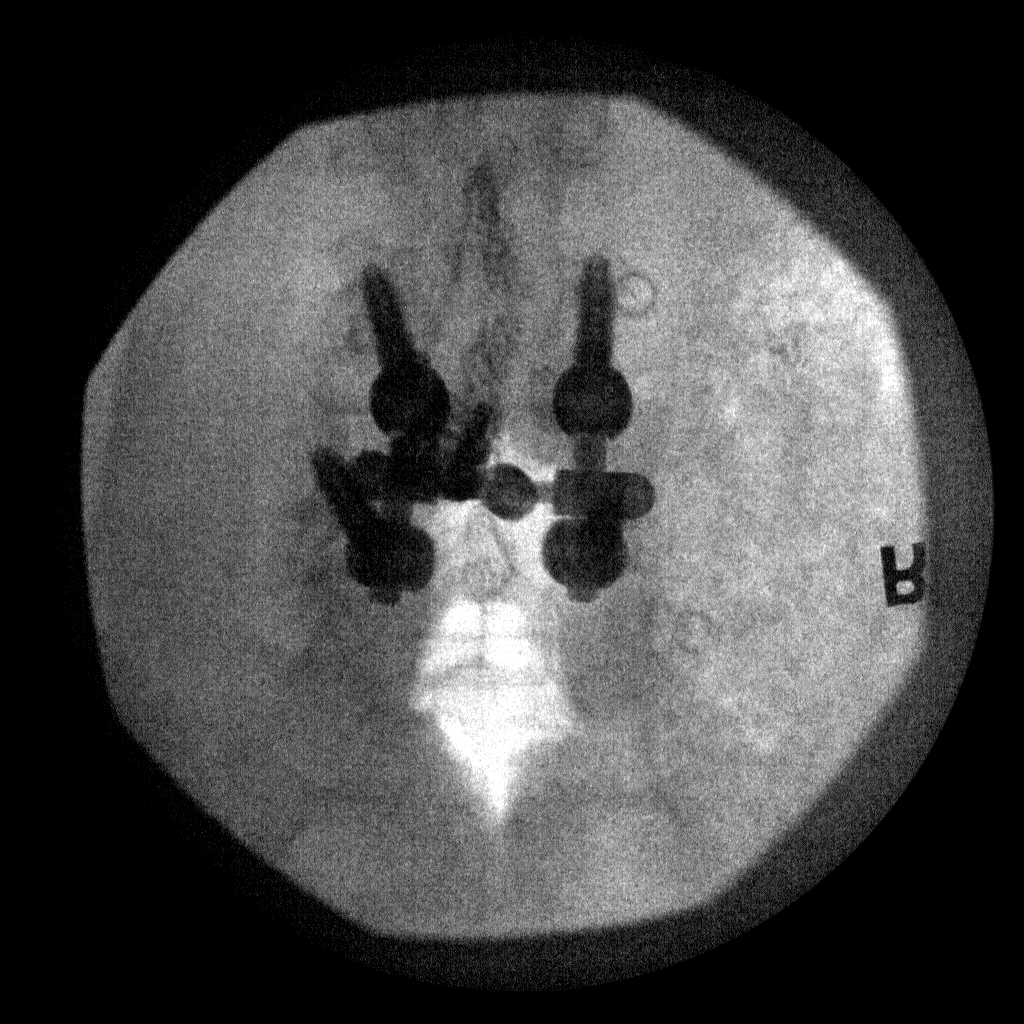
[im 2/4]
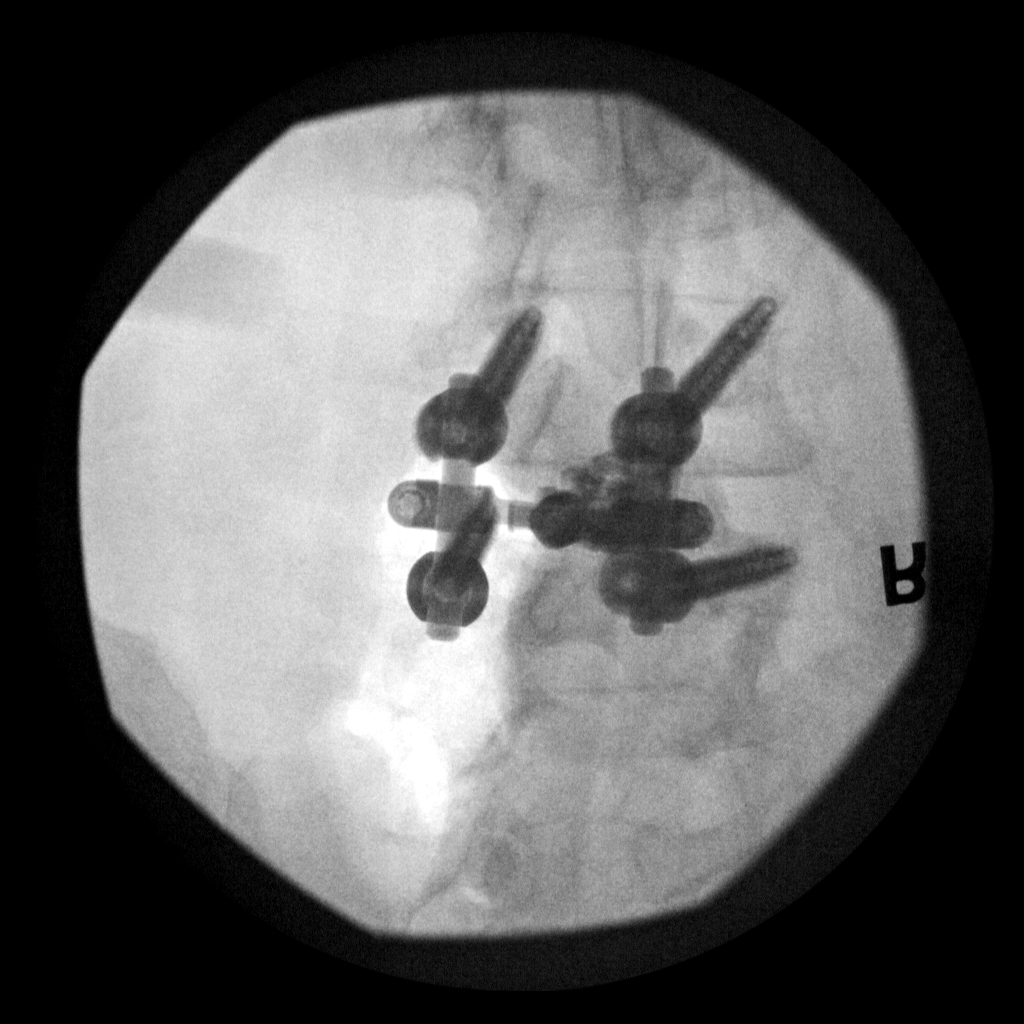
[im 3/4]
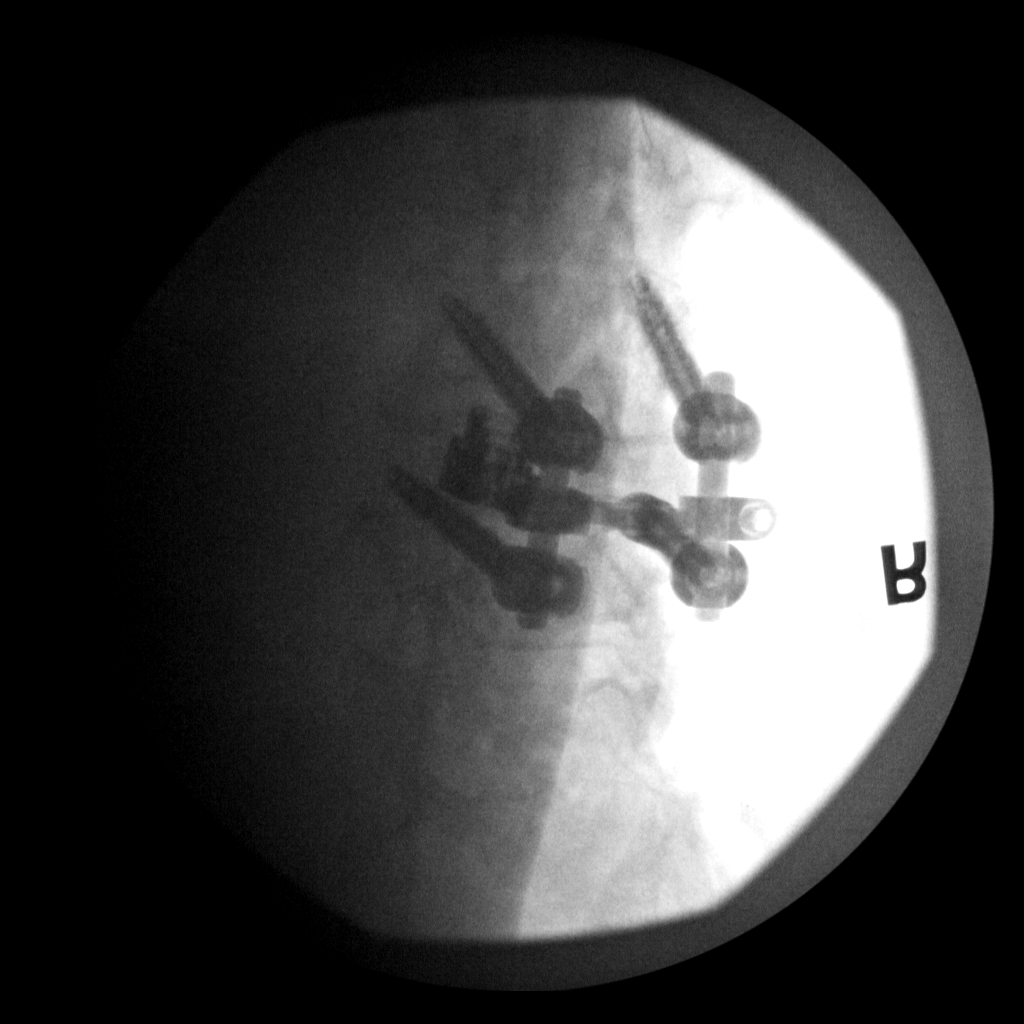
[im 4/4]
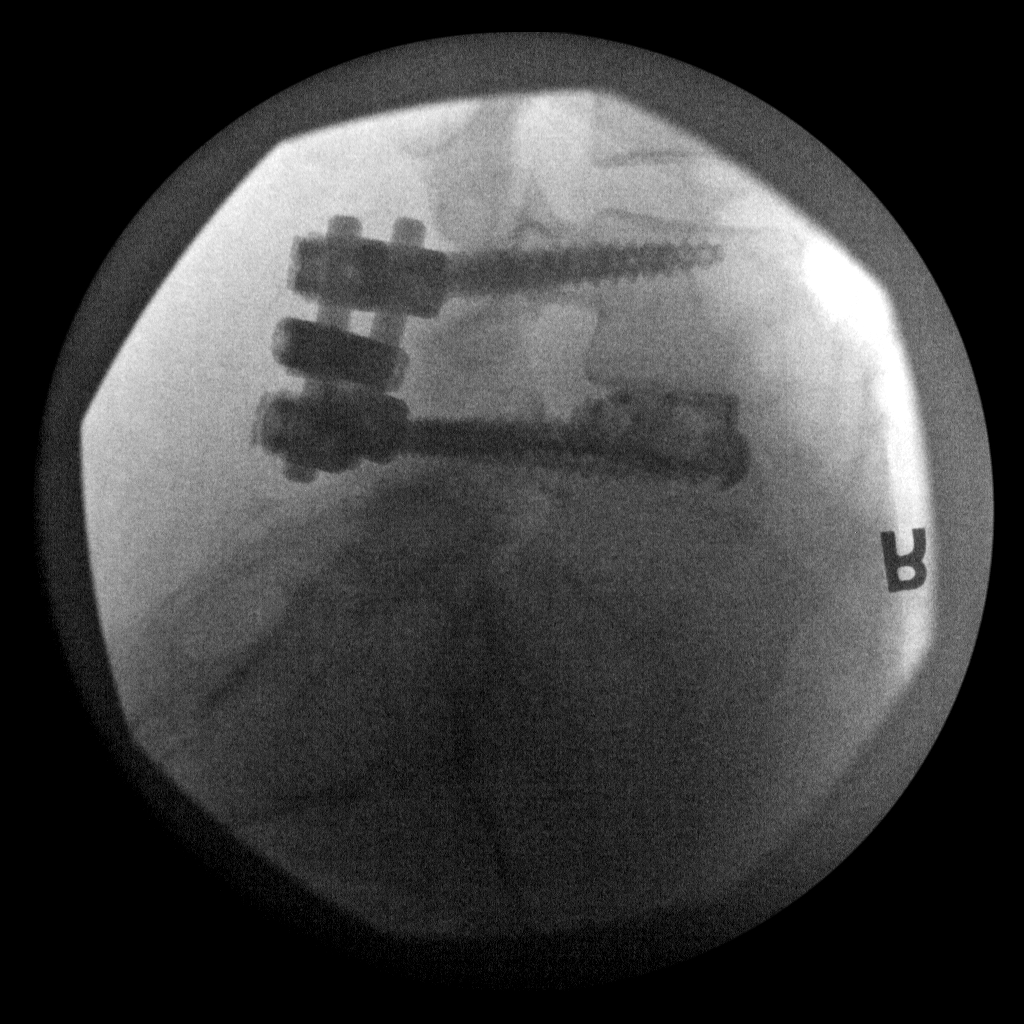

[4 of 4 positions shown; findings below may reference images not displayed]

FINDINGS: Posterior fusion changes noted at L4-5. No visible complicating
feature.
IMPRESSION: Posterior fusion changes at L4-5.  No visible complicating feature.

## 2021-02-11 IMAGING — RF DG LUMBAR SPINE COMPLETE 4+V
1 series · 4 of 4 positions shown · non-contrast
Comparison: [DATE]

CLINICAL DATA: L4-5 fusion

EXAM:
LUMBAR SPINE - COMPLETE 4+ VIEW; DG C-ARM 1-60 MIN

[Series 1: run · 4 of 4 slices shown]
[im 1/4]
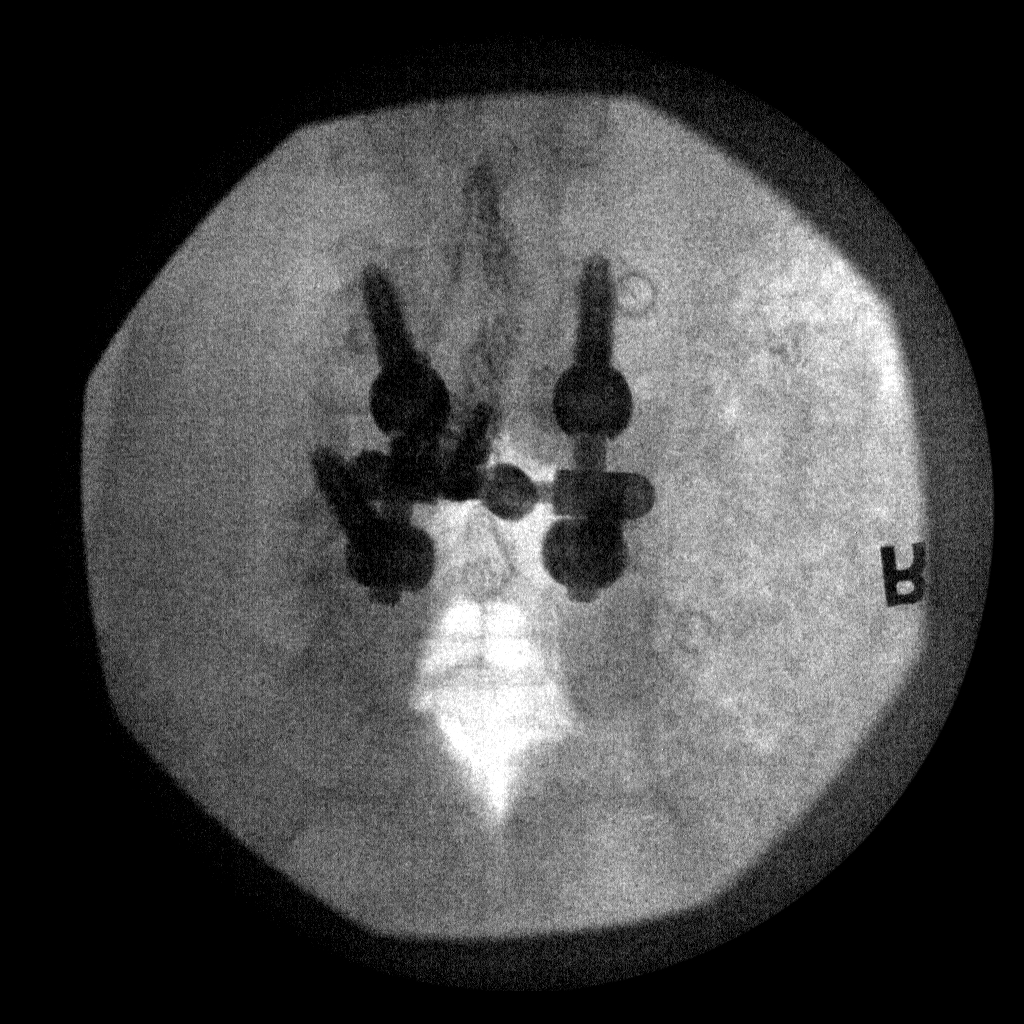
[im 2/4]
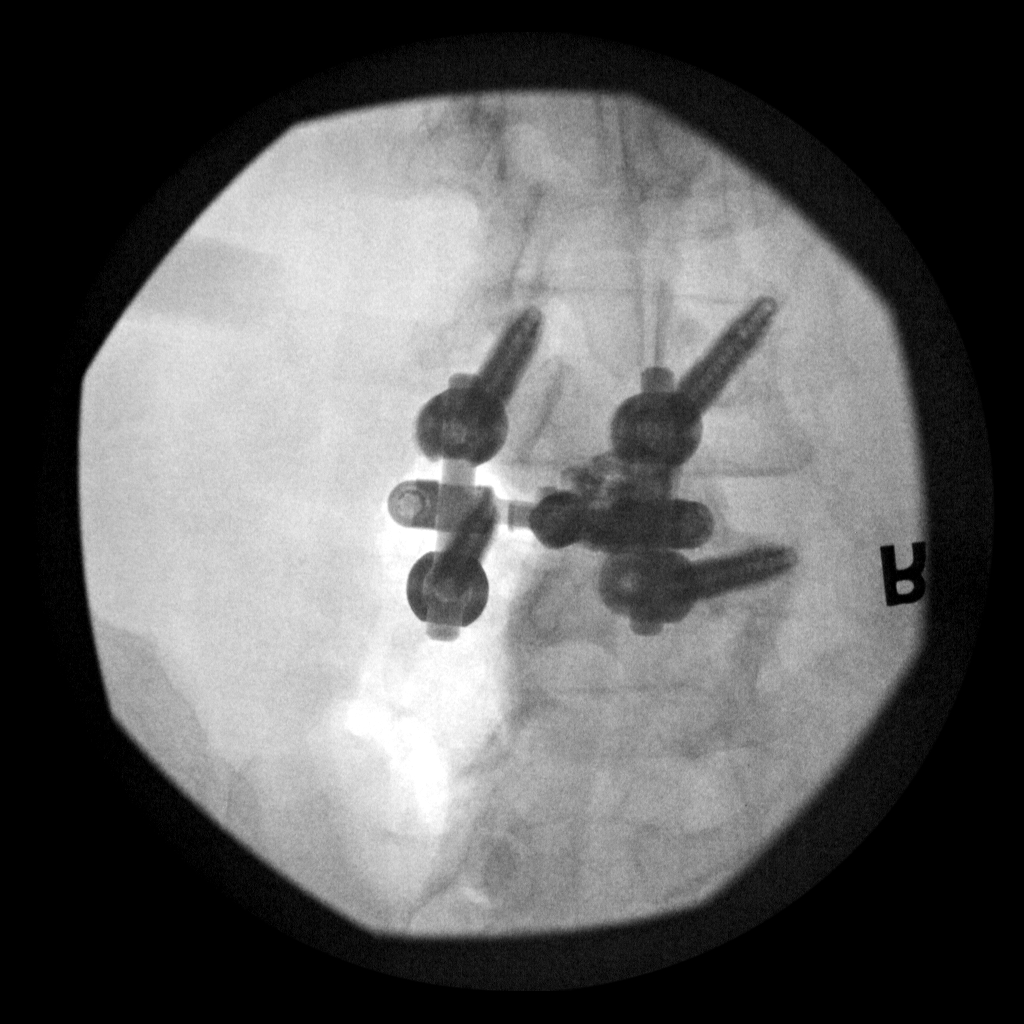
[im 3/4]
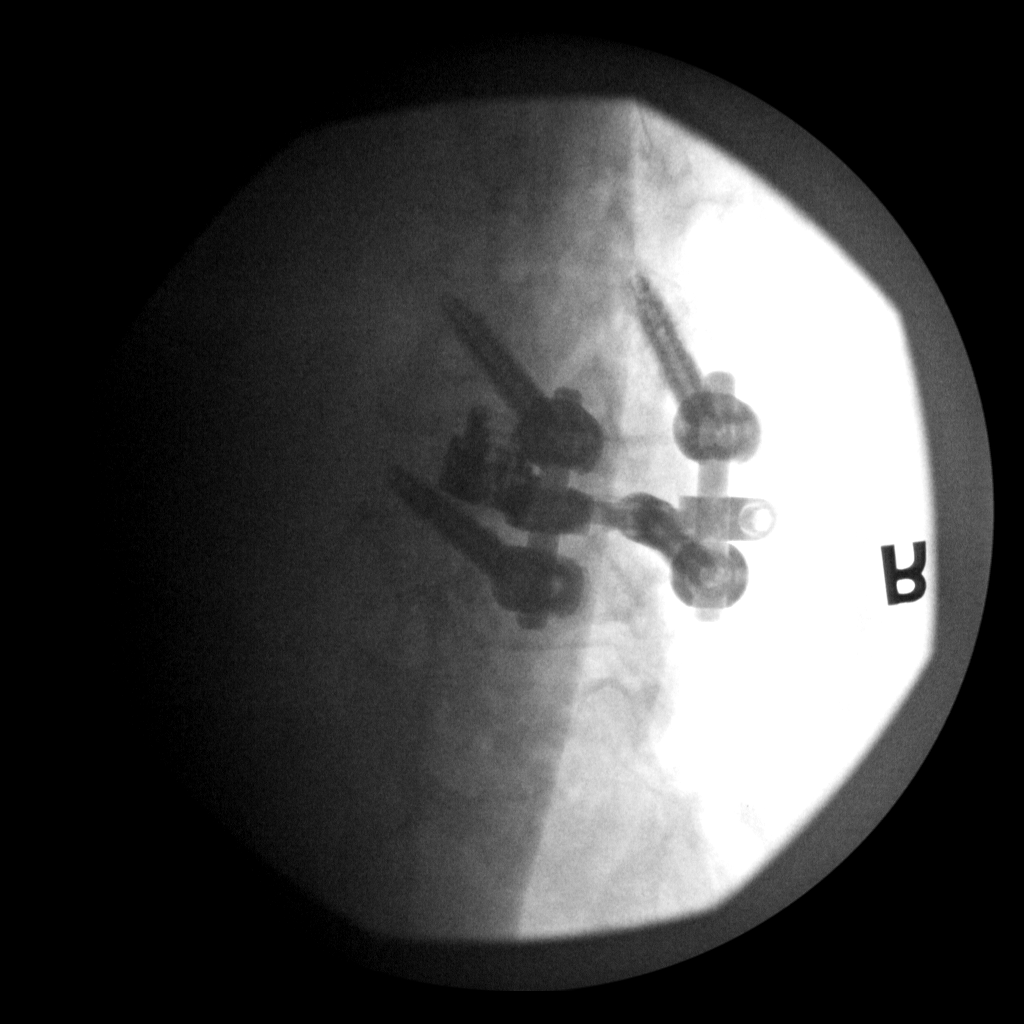
[im 4/4]
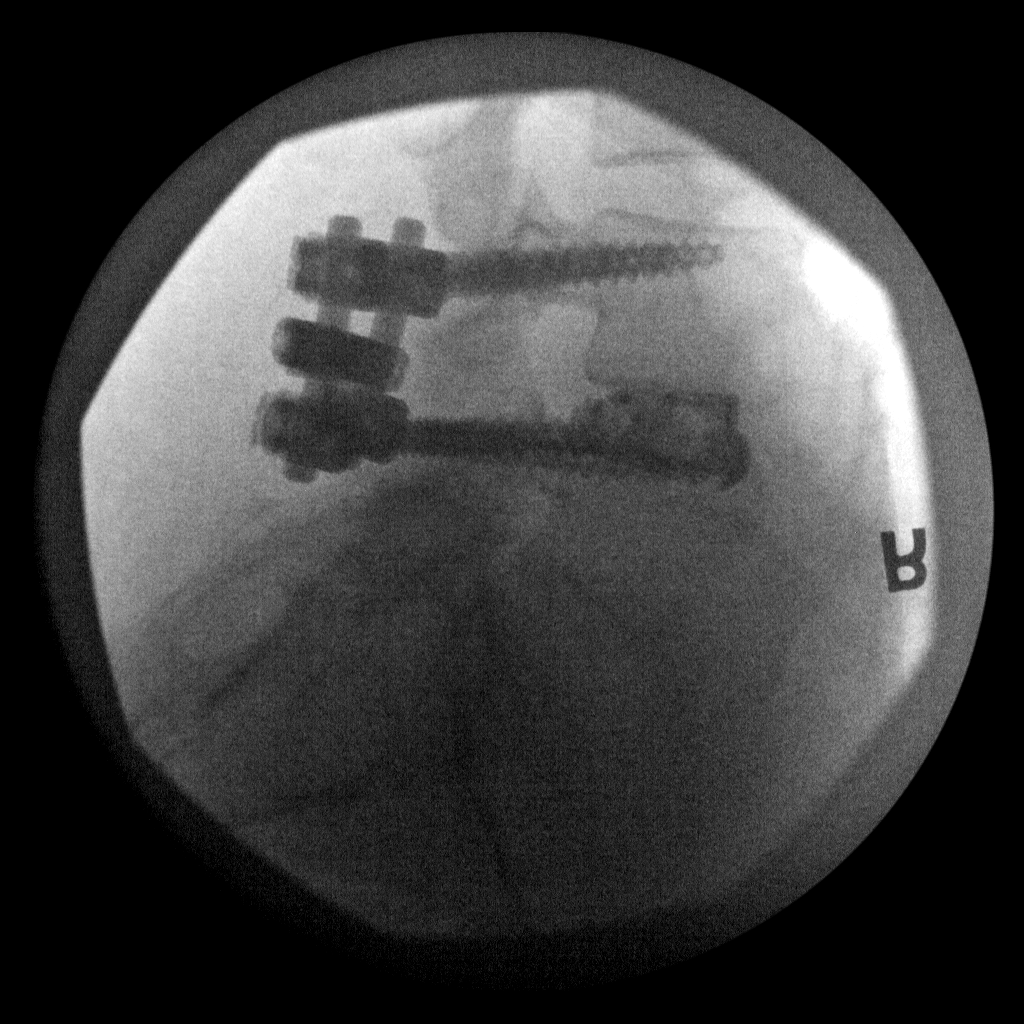

[4 of 4 positions shown; findings below may reference images not displayed]

FINDINGS: Posterior fusion changes noted at L4-5. No visible complicating
feature.
IMPRESSION: Posterior fusion changes at L4-5.  No visible complicating feature.

## 2021-02-11 SURGERY — POSTERIOR LUMBAR FUSION 1 LEVEL
Anesthesia: General

## 2021-02-11 MED ORDER — LOSARTAN POTASSIUM-HCTZ 100-25 MG PO TABS: 1.0000 | ORAL_TABLET | Freq: Every day | ORAL | Status: DC

## 2021-02-11 MED ORDER — LIDOCAINE 2% (20 MG/ML) 5 ML SYRINGE
INTRAMUSCULAR | Status: DC | PRN
  Administered 2021-02-11: 100 mg via INTRAVENOUS

## 2021-02-11 MED ORDER — BUPIVACAINE LIPOSOME 1.3 % IJ SUSP
20.0000 mL | Freq: Once | INTRAMUSCULAR | Status: AC
  Administered 2021-02-11: 9 mL
  Filled 2021-02-11: qty 20

## 2021-02-11 MED ORDER — ALUM & MAG HYDROXIDE-SIMETH 200-200-20 MG/5ML PO SUSP: 30.0000 mL | Freq: Four times a day (QID) | ORAL | Status: DC | PRN

## 2021-02-11 MED ORDER — FENTANYL CITRATE (PF) 250 MCG/5ML IJ SOLN
INTRAMUSCULAR | Status: AC
  Filled 2021-02-11: qty 5

## 2021-02-11 MED ORDER — THROMBIN (RECOMBINANT) 20000 UNITS EX SOLR
CUTANEOUS | Status: AC
  Filled 2021-02-11: qty 20000

## 2021-02-11 MED ORDER — PANTOPRAZOLE SODIUM 40 MG IV SOLR: 40.0000 mg | Freq: Every day | INTRAVENOUS | Status: DC

## 2021-02-11 MED ORDER — ONDANSETRON HCL 4 MG/2ML IJ SOLN
INTRAMUSCULAR | Status: AC
  Filled 2021-02-11: qty 2

## 2021-02-11 MED ORDER — LIDOCAINE 2% (20 MG/ML) 5 ML SYRINGE
INTRAMUSCULAR | Status: AC
  Filled 2021-02-11: qty 5

## 2021-02-11 MED ORDER — FLUTICASONE-UMECLIDIN-VILANT 100-62.5-25 MCG/INH IN AEPB: 1.0000 | INHALATION_SPRAY | Freq: Every day | RESPIRATORY_TRACT | Status: DC

## 2021-02-11 MED ORDER — ROCURONIUM BROMIDE 10 MG/ML (PF) SYRINGE
PREFILLED_SYRINGE | INTRAVENOUS | Status: AC
  Filled 2021-02-11: qty 10

## 2021-02-11 MED ORDER — INSULIN ASPART 100 UNIT/ML ~~LOC~~ SOLN
4.0000 [IU] | Freq: Three times a day (TID) | SUBCUTANEOUS | Status: DC
  Administered 2021-02-11 – 2021-02-12 (×3): 4 [IU] via SUBCUTANEOUS

## 2021-02-11 MED ORDER — HYDROCHLOROTHIAZIDE 25 MG PO TABS
25.0000 mg | ORAL_TABLET | Freq: Every day | ORAL | Status: DC
  Administered 2021-02-12 – 2021-02-13 (×2): 25 mg via ORAL
  Filled 2021-02-11 (×2): qty 1

## 2021-02-11 MED ORDER — METHOCARBAMOL 500 MG PO TABS
500.0000 mg | ORAL_TABLET | Freq: Four times a day (QID) | ORAL | Status: DC | PRN
  Administered 2021-02-11 – 2021-02-13 (×4): 500 mg via ORAL
  Filled 2021-02-11 (×4): qty 1

## 2021-02-11 MED ORDER — HYDROMORPHONE HCL 1 MG/ML IJ SOLN
INTRAMUSCULAR | Status: DC | PRN
  Administered 2021-02-11: .5 mg via INTRAVENOUS

## 2021-02-11 MED ORDER — LABETALOL HCL 5 MG/ML IV SOLN
INTRAVENOUS | Status: AC
  Filled 2021-02-11: qty 4

## 2021-02-11 MED ORDER — LOSARTAN POTASSIUM 50 MG PO TABS
100.0000 mg | ORAL_TABLET | Freq: Every day | ORAL | Status: DC
  Administered 2021-02-12 – 2021-02-13 (×2): 100 mg via ORAL
  Filled 2021-02-11 (×2): qty 2

## 2021-02-11 MED ORDER — ALBUTEROL SULFATE HFA 108 (90 BASE) MCG/ACT IN AERS: 2.0000 | INHALATION_SPRAY | RESPIRATORY_TRACT | Status: DC | PRN

## 2021-02-11 MED ORDER — ACETAMINOPHEN 650 MG RE SUPP: 650.0000 mg | RECTAL | Status: DC | PRN

## 2021-02-11 MED ORDER — PANTOPRAZOLE SODIUM 40 MG PO TBEC
40.0000 mg | DELAYED_RELEASE_TABLET | Freq: Every day | ORAL | Status: DC
  Administered 2021-02-11 – 2021-02-12 (×2): 40 mg via ORAL
  Filled 2021-02-11 (×2): qty 1

## 2021-02-11 MED ORDER — UMECLIDINIUM BROMIDE 62.5 MCG/INH IN AEPB
1.0000 | INHALATION_SPRAY | Freq: Every day | RESPIRATORY_TRACT | Status: DC
  Administered 2021-02-12 – 2021-02-13 (×2): 1 via RESPIRATORY_TRACT
  Filled 2021-02-11: qty 7

## 2021-02-11 MED ORDER — FENTANYL CITRATE (PF) 100 MCG/2ML IJ SOLN
25.0000 ug | INTRAMUSCULAR | Status: DC | PRN
  Administered 2021-02-11 (×2): 50 ug via INTRAVENOUS

## 2021-02-11 MED ORDER — BISACODYL 5 MG PO TBEC
5.0000 mg | DELAYED_RELEASE_TABLET | Freq: Every day | ORAL | Status: DC | PRN
Start: 2021-02-11 — End: 2021-02-13

## 2021-02-11 MED ORDER — ALBUTEROL SULFATE (2.5 MG/3ML) 0.083% IN NEBU: 2.5000 mg | INHALATION_SOLUTION | RESPIRATORY_TRACT | Status: DC | PRN

## 2021-02-11 MED ORDER — THROMBIN 20000 UNITS EX SOLR
CUTANEOUS | Status: DC | PRN
  Administered 2021-02-11: 20 mL via TOPICAL

## 2021-02-11 MED ORDER — DOCUSATE SODIUM 100 MG PO CAPS
100.0000 mg | ORAL_CAPSULE | Freq: Two times a day (BID) | ORAL | Status: DC
  Administered 2021-02-11 – 2021-02-13 (×4): 100 mg via ORAL
  Filled 2021-02-11 (×4): qty 1

## 2021-02-11 MED ORDER — INSULIN ASPART 100 UNIT/ML ~~LOC~~ SOLN
0.0000 [IU] | Freq: Every day | SUBCUTANEOUS | Status: DC
  Administered 2021-02-11 – 2021-02-12 (×2): 2 [IU] via SUBCUTANEOUS

## 2021-02-11 MED ORDER — ACETAMINOPHEN 500 MG PO TABS: 1000.0000 mg | ORAL_TABLET | Freq: Four times a day (QID) | ORAL | Status: DC | PRN

## 2021-02-11 MED ORDER — MORPHINE SULFATE (PF) 2 MG/ML IV SOLN: 1.0000 mg | INTRAVENOUS | Status: DC | PRN

## 2021-02-11 MED ORDER — LIRAGLUTIDE 18 MG/3ML ~~LOC~~ SOPN: 1.8000 mg | PEN_INJECTOR | Freq: Every day | SUBCUTANEOUS | Status: DC

## 2021-02-11 MED ORDER — METHOCARBAMOL 1000 MG/10ML IJ SOLN
500.0000 mg | Freq: Four times a day (QID) | INTRAVENOUS | Status: DC | PRN
  Filled 2021-02-11: qty 5

## 2021-02-11 MED ORDER — ONDANSETRON HCL 4 MG PO TABS: 4.0000 mg | ORAL_TABLET | Freq: Four times a day (QID) | ORAL | Status: DC | PRN

## 2021-02-11 MED ORDER — DEXMEDETOMIDINE (PRECEDEX) IN NS 20 MCG/5ML (4 MCG/ML) IV SYRINGE
PREFILLED_SYRINGE | INTRAVENOUS | Status: DC | PRN
  Administered 2021-02-11 (×2): 10 ug via INTRAVENOUS

## 2021-02-11 MED ORDER — HYDROCODONE-ACETAMINOPHEN 7.5-325 MG PO TABS
2.0000 | ORAL_TABLET | ORAL | Status: DC | PRN
  Administered 2021-02-11 – 2021-02-13 (×8): 2 via ORAL
  Filled 2021-02-11 (×8): qty 2

## 2021-02-11 MED ORDER — MENTHOL 3 MG MT LOZG: 1.0000 | LOZENGE | OROMUCOSAL | Status: DC | PRN

## 2021-02-11 MED ORDER — SODIUM CHLORIDE 0.9 % IV SOLN: INTRAVENOUS | Status: DC

## 2021-02-11 MED ORDER — ASPIRIN EC 81 MG PO TBEC
81.0000 mg | DELAYED_RELEASE_TABLET | Freq: Every day | ORAL | Status: DC
  Administered 2021-02-11 – 2021-02-13 (×3): 81 mg via ORAL
  Filled 2021-02-11 (×3): qty 1

## 2021-02-11 MED ORDER — SODIUM CHLORIDE 0.9% FLUSH: 3.0000 mL | INTRAVENOUS | Status: DC | PRN

## 2021-02-11 MED ORDER — SUCCINYLCHOLINE CHLORIDE 200 MG/10ML IV SOSY
PREFILLED_SYRINGE | INTRAVENOUS | Status: AC
  Filled 2021-02-11: qty 10

## 2021-02-11 MED ORDER — METOPROLOL SUCCINATE ER 50 MG PO TB24
50.0000 mg | ORAL_TABLET | Freq: Every day | ORAL | Status: DC
  Administered 2021-02-12 – 2021-02-13 (×2): 50 mg via ORAL
  Filled 2021-02-11 (×2): qty 1

## 2021-02-11 MED ORDER — LACTATED RINGERS IV SOLN: INTRAVENOUS | Status: DC | PRN

## 2021-02-11 MED ORDER — 0.9 % SODIUM CHLORIDE (POUR BTL) OPTIME
TOPICAL | Status: DC | PRN
  Administered 2021-02-11 (×2): 1000 mL

## 2021-02-11 MED ORDER — PHENOL 1.4 % MT LIQD: 1.0000 | OROMUCOSAL | Status: DC | PRN

## 2021-02-11 MED ORDER — KETAMINE HCL 50 MG/5ML IJ SOSY
PREFILLED_SYRINGE | INTRAMUSCULAR | Status: AC
  Filled 2021-02-11: qty 5

## 2021-02-11 MED ORDER — GABAPENTIN 300 MG PO CAPS
900.0000 mg | ORAL_CAPSULE | Freq: Every day | ORAL | Status: DC
  Administered 2021-02-11 – 2021-02-12 (×2): 900 mg via ORAL
  Filled 2021-02-11 (×2): qty 3

## 2021-02-11 MED ORDER — PROPOFOL 10 MG/ML IV BOLUS
INTRAVENOUS | Status: AC
  Filled 2021-02-11: qty 20

## 2021-02-11 MED ORDER — BUPIVACAINE HCL (PF) 0.5 % IJ SOLN
INTRAMUSCULAR | Status: AC
  Filled 2021-02-11: qty 30

## 2021-02-11 MED ORDER — ONDANSETRON HCL 4 MG/2ML IJ SOLN: 4.0000 mg | Freq: Four times a day (QID) | INTRAMUSCULAR | Status: DC | PRN

## 2021-02-11 MED ORDER — ACCU-CHEK GUIDE W/DEVICE KIT: 1.0000 | PACK | Freq: Two times a day (BID) | Status: DC

## 2021-02-11 MED ORDER — FENTANYL CITRATE (PF) 100 MCG/2ML IJ SOLN
INTRAMUSCULAR | Status: AC
  Filled 2021-02-11: qty 2

## 2021-02-11 MED ORDER — SODIUM CHLORIDE 0.9% FLUSH
3.0000 mL | Freq: Two times a day (BID) | INTRAVENOUS | Status: DC
  Administered 2021-02-11: 3 mL via INTRAVENOUS

## 2021-02-11 MED ORDER — CLOPIDOGREL BISULFATE 75 MG PO TABS
75.0000 mg | ORAL_TABLET | Freq: Every morning | ORAL | Status: DC
  Administered 2021-02-11: 75 mg via ORAL
  Filled 2021-02-11: qty 1

## 2021-02-11 MED ORDER — ASCORBIC ACID 500 MG PO TABS
1000.0000 mg | ORAL_TABLET | Freq: Every day | ORAL | Status: DC
Start: 2021-02-11 — End: 2021-02-13
  Administered 2021-02-11 – 2021-02-13 (×3): 1000 mg via ORAL
  Filled 2021-02-11 (×3): qty 2

## 2021-02-11 MED ORDER — HYDROMORPHONE HCL 1 MG/ML IJ SOLN
INTRAMUSCULAR | Status: AC
  Filled 2021-02-11: qty 0.5

## 2021-02-11 MED ORDER — AMLODIPINE-ATORVASTATIN 5-80 MG PO TABS: 1.0000 | ORAL_TABLET | Freq: Every day | ORAL | Status: DC

## 2021-02-11 MED ORDER — ALBUMIN HUMAN 5 % IV SOLN: INTRAVENOUS | Status: DC | PRN

## 2021-02-11 MED ORDER — FLEET ENEMA 7-19 GM/118ML RE ENEM: 1.0000 | ENEMA | Freq: Once | RECTAL | Status: DC | PRN

## 2021-02-11 MED ORDER — FLUTICASONE FUROATE-VILANTEROL 100-25 MCG/INH IN AEPB
1.0000 | INHALATION_SPRAY | Freq: Every day | RESPIRATORY_TRACT | Status: DC
  Administered 2021-02-12 – 2021-02-13 (×2): 1 via RESPIRATORY_TRACT
  Filled 2021-02-11: qty 28

## 2021-02-11 MED ORDER — HYDROMORPHONE HCL 1 MG/ML IJ SOLN
INTRAMUSCULAR | Status: AC
  Filled 2021-02-11: qty 1

## 2021-02-11 MED ORDER — SURGIFOAM 100 EX MISC: CUTANEOUS | Status: DC | PRN

## 2021-02-11 MED ORDER — FENTANYL CITRATE (PF) 100 MCG/2ML IJ SOLN
INTRAMUSCULAR | Status: DC | PRN
  Administered 2021-02-11: 100 ug via INTRAVENOUS
  Administered 2021-02-11: 50 ug via INTRAVENOUS
  Administered 2021-02-11: 100 ug via INTRAVENOUS

## 2021-02-11 MED ORDER — AMLODIPINE BESYLATE 5 MG PO TABS
5.0000 mg | ORAL_TABLET | Freq: Every day | ORAL | Status: DC
  Administered 2021-02-11 – 2021-02-13 (×3): 5 mg via ORAL
  Filled 2021-02-11 (×3): qty 1

## 2021-02-11 MED ORDER — HYDROCODONE-ACETAMINOPHEN 7.5-325 MG PO TABS: 1.0000 | ORAL_TABLET | ORAL | Status: DC | PRN

## 2021-02-11 MED ORDER — ATORVASTATIN CALCIUM 80 MG PO TABS
80.0000 mg | ORAL_TABLET | Freq: Every day | ORAL | Status: DC
  Administered 2021-02-11 – 2021-02-12 (×2): 80 mg via ORAL
  Filled 2021-02-11 (×2): qty 1

## 2021-02-11 MED ORDER — BUPIVACAINE HCL 0.5 % IJ SOLN
INTRAMUSCULAR | Status: DC | PRN
  Administered 2021-02-11: 9 mL

## 2021-02-11 MED ORDER — MIDAZOLAM HCL 5 MG/5ML IJ SOLN
INTRAMUSCULAR | Status: DC | PRN
  Administered 2021-02-11: 2 mg via INTRAVENOUS

## 2021-02-11 MED ORDER — CEFAZOLIN SODIUM-DEXTROSE 2-4 GM/100ML-% IV SOLN
2.0000 g | Freq: Three times a day (TID) | INTRAVENOUS | Status: AC
Start: 2021-02-11 — End: 2021-02-11
  Administered 2021-02-11 (×2): 2 g via INTRAVENOUS
  Filled 2021-02-11 (×2): qty 100

## 2021-02-11 MED ORDER — INSULIN ASPART 100 UNIT/ML ~~LOC~~ SOLN
0.0000 [IU] | Freq: Three times a day (TID) | SUBCUTANEOUS | Status: DC
  Administered 2021-02-12 (×2): 11 [IU] via SUBCUTANEOUS
  Administered 2021-02-12 – 2021-02-13 (×2): 7 [IU] via SUBCUTANEOUS

## 2021-02-11 MED ORDER — ACETAMINOPHEN 500 MG PO TABS
1000.0000 mg | ORAL_TABLET | Freq: Once | ORAL | Status: AC
  Administered 2021-02-11: 1000 mg via ORAL
  Filled 2021-02-11: qty 2

## 2021-02-11 MED ORDER — CHLORHEXIDINE GLUCONATE 0.12 % MT SOLN: 15.0000 mL | Freq: Once | OROMUCOSAL | Status: AC

## 2021-02-11 MED ORDER — FUROSEMIDE 40 MG PO TABS: 40.0000 mg | ORAL_TABLET | ORAL | Status: DC | PRN

## 2021-02-11 MED ORDER — ONDANSETRON HCL 4 MG/2ML IJ SOLN
INTRAMUSCULAR | Status: DC | PRN
  Administered 2021-02-11: 4 mg via INTRAVENOUS

## 2021-02-11 MED ORDER — INSULIN ASPART 100 UNIT/ML ~~LOC~~ SOLN
0.0000 [IU] | Freq: Three times a day (TID) | SUBCUTANEOUS | Status: DC
  Administered 2021-02-11: 7 [IU] via SUBCUTANEOUS

## 2021-02-11 MED ORDER — PROPOFOL 10 MG/ML IV BOLUS
INTRAVENOUS | Status: AC
  Filled 2021-02-11: qty 40

## 2021-02-11 MED ORDER — KETAMINE HCL 10 MG/ML IJ SOLN
INTRAMUSCULAR | Status: DC | PRN
  Administered 2021-02-11: 12.5 mg via INTRAVENOUS
  Administered 2021-02-11 (×2): 25 mg via INTRAVENOUS
  Administered 2021-02-11: 12.5 mg via INTRAVENOUS

## 2021-02-11 MED ORDER — MELATONIN 5 MG PO TABS: 5.0000 mg | ORAL_TABLET | Freq: Every evening | ORAL | Status: DC | PRN

## 2021-02-11 MED ORDER — CHLORHEXIDINE GLUCONATE 0.12 % MT SOLN
OROMUCOSAL | Status: AC
  Administered 2021-02-11: 15 mL via OROMUCOSAL
  Filled 2021-02-11: qty 15

## 2021-02-11 MED ORDER — CELECOXIB 200 MG PO CAPS
200.0000 mg | ORAL_CAPSULE | Freq: Two times a day (BID) | ORAL | Status: DC
  Administered 2021-02-11 – 2021-02-13 (×4): 200 mg via ORAL
  Filled 2021-02-11 (×4): qty 1

## 2021-02-11 MED ORDER — SUGAMMADEX SODIUM 200 MG/2ML IV SOLN
INTRAVENOUS | Status: DC | PRN
  Administered 2021-02-11: 360 mg via INTRAVENOUS

## 2021-02-11 MED ORDER — ACETAMINOPHEN 325 MG PO TABS: 650.0000 mg | ORAL_TABLET | ORAL | Status: DC | PRN

## 2021-02-11 MED ORDER — HYDROMORPHONE HCL 1 MG/ML IJ SOLN
0.2500 mg | INTRAMUSCULAR | Status: DC | PRN
  Administered 2021-02-11 (×2): 0.5 mg via INTRAVENOUS

## 2021-02-11 MED ORDER — ROCURONIUM BROMIDE 10 MG/ML (PF) SYRINGE
PREFILLED_SYRINGE | INTRAVENOUS | Status: DC | PRN
  Administered 2021-02-11: 20 mg via INTRAVENOUS
  Administered 2021-02-11: 100 mg via INTRAVENOUS
  Administered 2021-02-11 (×2): 20 mg via INTRAVENOUS

## 2021-02-11 MED ORDER — HYDROCORTISONE 1 % EX CREA
1.0000 "application " | TOPICAL_CREAM | Freq: Every day | CUTANEOUS | Status: DC | PRN
  Filled 2021-02-11: qty 28

## 2021-02-11 MED ORDER — POLYETHYLENE GLYCOL 3350 17 G PO PACK: 17.0000 g | PACK | Freq: Every day | ORAL | Status: DC | PRN

## 2021-02-11 MED ORDER — DULOXETINE HCL 30 MG PO CPEP
60.0000 mg | ORAL_CAPSULE | Freq: Every day | ORAL | Status: DC
  Administered 2021-02-12 – 2021-02-13 (×2): 60 mg via ORAL
  Filled 2021-02-11 (×2): qty 2

## 2021-02-11 MED ORDER — NITROGLYCERIN 0.4 MG SL SUBL: 0.4000 mg | SUBLINGUAL_TABLET | SUBLINGUAL | Status: DC | PRN

## 2021-02-11 MED ORDER — PROPOFOL 10 MG/ML IV BOLUS
INTRAVENOUS | Status: DC | PRN
  Administered 2021-02-11: 150 mg via INTRAVENOUS

## 2021-02-11 MED ORDER — MIDAZOLAM HCL 2 MG/2ML IJ SOLN
INTRAMUSCULAR | Status: AC
  Filled 2021-02-11: qty 2

## 2021-02-11 SURGICAL SUPPLY — 64 items
BONE VIVIGEN FORMABLE 5.4CC (Bone Implant) ×2 IMPLANT
BUR MATCHSTICK NEURO 3.0 LAGG (BURR) ×2 IMPLANT
CAGE LORD X-PAC LT 12 X 28 (Cage) ×2 IMPLANT
CONNECTOR CROSS SFX A2 (Connector) ×2 IMPLANT
COVER BACK TABLE 80X110 HD (DRAPES) ×2 IMPLANT
COVER SURGICAL LIGHT HANDLE (MISCELLANEOUS) ×2 IMPLANT
COVER WAND RF STERILE (DRAPES) ×2 IMPLANT
DECANTER SPIKE VIAL GLASS SM (MISCELLANEOUS) ×2 IMPLANT
DERMABOND ADVANCED (GAUZE/BANDAGES/DRESSINGS) ×1
DERMABOND ADVANCED .7 DNX12 (GAUZE/BANDAGES/DRESSINGS) ×1 IMPLANT
DRAPE C-ARM 42X72 X-RAY (DRAPES) ×2 IMPLANT
DRAPE C-ARMOR (DRAPES) ×2 IMPLANT
DRAPE MICROSCOPE LEICA (MISCELLANEOUS) ×2 IMPLANT
DRAPE POUCH INSTRU U-SHP 10X18 (DRAPES) ×2 IMPLANT
DRAPE SURG 17X23 STRL (DRAPES) ×8 IMPLANT
DRSG MEPILEX BORDER 4X8 (GAUZE/BANDAGES/DRESSINGS) ×2 IMPLANT
DURAPREP 26ML APPLICATOR (WOUND CARE) ×2 IMPLANT
ELECT BLADE 4.0 EZ CLEAN MEGAD (MISCELLANEOUS) ×2
ELECT CAUTERY BLADE 6.4 (BLADE) ×2 IMPLANT
ELECT REM PT RETURN 9FT ADLT (ELECTROSURGICAL) ×2
ELECTRODE BLDE 4.0 EZ CLN MEGD (MISCELLANEOUS) ×1 IMPLANT
ELECTRODE REM PT RTRN 9FT ADLT (ELECTROSURGICAL) ×1 IMPLANT
GAUZE SPONGE 4X4 12PLY STRL (GAUZE/BANDAGES/DRESSINGS) ×2 IMPLANT
GLOVE ECLIPSE 9.0 STRL (GLOVE) ×2 IMPLANT
GLOVE ORTHO TXT STRL SZ7.5 (GLOVE) ×2 IMPLANT
GLOVE SRG 8 PF TXTR STRL LF DI (GLOVE) ×1 IMPLANT
GLOVE SURG 8.5 LATEX PF (GLOVE) ×2 IMPLANT
GLOVE SURG UNDER POLY LF SZ8 (GLOVE) ×2
GOWN STRL REUS W/ TWL LRG LVL3 (GOWN DISPOSABLE) ×1 IMPLANT
GOWN STRL REUS W/TWL 2XL LVL3 (GOWN DISPOSABLE) ×4 IMPLANT
GOWN STRL REUS W/TWL LRG LVL3 (GOWN DISPOSABLE) ×2
KIT BASIN OR (CUSTOM PROCEDURE TRAY) ×2 IMPLANT
KIT POSITION SURG JACKSON T1 (MISCELLANEOUS) ×2 IMPLANT
KIT TURNOVER KIT B (KITS) ×2 IMPLANT
NEEDLE 22X1 1/2 (OR ONLY) (NEEDLE) ×2 IMPLANT
NEEDLE SPNL 18GX3.5 QUINCKE PK (NEEDLE) ×2 IMPLANT
NS IRRIG 1000ML POUR BTL (IV SOLUTION) ×2 IMPLANT
PACK LAMINECTOMY ORTHO (CUSTOM PROCEDURE TRAY) ×2 IMPLANT
PAD ARMBOARD 7.5X6 YLW CONV (MISCELLANEOUS) ×4 IMPLANT
PATTIES SURGICAL .75X.75 (GAUZE/BANDAGES/DRESSINGS) ×2 IMPLANT
PATTIES SURGICAL 1X1 (DISPOSABLE) ×2 IMPLANT
ROD PRE BENT EXP 40MM (Rod) ×4 IMPLANT
SCREW SET SINGLE INNER (Screw) ×8 IMPLANT
SCREW VIPER 7X45MM (Screw) ×8 IMPLANT
SPOGE SURGIFLO 8M (HEMOSTASIS)
SPONGE SURGIFLO 8M (HEMOSTASIS) IMPLANT
SPONGE SURGIFOAM ABS GEL 100 (HEMOSTASIS) ×2 IMPLANT
SUT VIC AB 0 CT1 27 (SUTURE) ×4
SUT VIC AB 0 CT1 27XBRD ANBCTR (SUTURE) ×2 IMPLANT
SUT VIC AB 1 CTX 36 (SUTURE) ×4
SUT VIC AB 1 CTX36XBRD ANBCTR (SUTURE) ×2 IMPLANT
SUT VIC AB 2-0 CT2 27 (SUTURE) ×2 IMPLANT
SUT VICRYL 3 0 CT 3 (SUTURE) ×2 IMPLANT
SYR 20ML LL LF (SYRINGE) ×2 IMPLANT
SYR CONTROL 10ML LL (SYRINGE) ×4 IMPLANT
TAP CANN VIPER2 DL 5.0 (TAP) ×2 IMPLANT
TAP CANN VIPER2 DL 6.0 (TAP) ×2 IMPLANT
TAP CANN VIPER2 DL 7.0 (TAP) ×2 IMPLANT
TAP VIPER MIS 4.35MM (TAP) ×2 IMPLANT
TOWEL GREEN STERILE (TOWEL DISPOSABLE) ×2 IMPLANT
TOWEL GREEN STERILE FF (TOWEL DISPOSABLE) ×2 IMPLANT
TRAY FOLEY MTR SLVR 16FR STAT (SET/KITS/TRAYS/PACK) ×2 IMPLANT
WATER STERILE IRR 1000ML POUR (IV SOLUTION) ×2 IMPLANT
YANKAUER SUCT BULB TIP NO VENT (SUCTIONS) ×2 IMPLANT

## 2021-02-11 NOTE — Evaluation (Signed)
Physical Therapy Evaluation Patient Details Name: [name] MRN: [id number] DOB: May 13, 1961 Today's Date: 02/11/2021   History of Present Illness  Pt is a 60 y.o. F with significant PMH of CAD, COPD, depression, diabetes mellitus, PE, bilateral TKA's who presents s/p L L4-5 PLIF.  Clinical Impression  Patient is s/p above surgery resulting in the deficits listed below (see PT Problem List). Prior to admission, pt lives with her spouse and is independent using a cane for mobility. Pt presents with surgical back pain, generalized weakness, decreased activity tolerance, and balance deficits. Pt requiring min assist for transfers; ambulating x 12 feet with a walker at a min guard assist level. Education initiated regarding spinal precautions, brace use and mobility progression. Patient will benefit from skilled PT to increase their independence and safety with mobility (while adhering to their precautions) to allow discharge to the venue listed below.     Follow Up Recommendations Home health PT;Supervision/Assistance - 24 hour    Equipment Recommendations  Rolling walker with 5" wheels (bariatric);3in1 (PT) (bariatric)    Recommendations for Other Services       Precautions / Restrictions Precautions Precautions: Back;Fall Precaution Booklet Issued: Yes (comment) Precaution Comments: Verbally reviewed, provided written handout Restrictions Weight Bearing Restrictions: No      Mobility  Bed Mobility Overal bed mobility: Needs Assistance Bed Mobility: Rolling;Sidelying to Sit;Sit to Sidelying Rolling: Supervision Sidelying to sit: Min assist     Sit to sidelying: Mod assist General bed mobility comments: HOB elevated, cues for log roll technique, minA for trunk to upright and modA for BLE management back into bed. heavy use of rails    Transfers Overall transfer level: Needs assistance Equipment used: Rolling walker (2 wheeled) Transfers: Sit to/from Stand Sit to  Stand: Min assist         General transfer comment: MinA to rise to stand from edge of bed. Cues for hand placement and transitioning to walker  Ambulation/Gait Ambulation/Gait assistance: Min guard Gait Distance (Feet): 12 Feet Assistive device: Rolling walker (2 wheeled) Gait Pattern/deviations: Step-through pattern;Decreased stride length;Trunk flexed Gait velocity: decreased Gait velocity interpretation: <1.31 ft/sec, indicative of household ambulator General Gait Details: Pt with slow pace, having to lean down on forearms halfway through due to fatigue. Min guard for safety, no overt LOB. Cues for sequencing/direction, use of arms on walker  Stairs            Wheelchair Mobility    Modified Rankin (Stroke Patients Only)       Balance Overall balance assessment: Needs assistance Sitting-balance support: Feet supported Sitting balance-Leahy Scale: Fair     Standing balance support: Bilateral upper extremity supported Standing balance-Leahy Scale: Poor Standing balance comment: reliant on external support                             Pertinent Vitals/Pain Pain Assessment: Faces Faces Pain Scale: Hurts even more Pain Location: surgical site Pain Descriptors / Indicators: Operative site guarding;Grimacing Pain Intervention(s): Limited activity within patient's tolerance;Monitored during session    [place] expects to be discharged to:: Private residence Living Arrangements: Spouse/significant other Available Help at Discharge: Family (spouse, daughter) Type of Home: House Home Access: Stairs to enter;Ramped entrance   Entrance Stairs-Number of Steps: 2 ((also has ramp)) Home Layout: One level Home Equipment: Walker - 4 wheels;Cane - single point;Shower seat;Grab bars - tub/shower;Wheelchair - manual      Prior Function Level of Independence: Independent  with assistive device(s)         Comments: Using cane     Hand  Dominance        Extremity/Trunk Assessment   Upper Extremity Assessment Upper Extremity Assessment: Defer to OT evaluation    Lower Extremity Assessment Lower Extremity Assessment: Generalized weakness    Cervical / Trunk Assessment Cervical / Trunk Assessment: Other exceptions Cervical / Trunk Exceptions: increased body habitus, s/p PLIF  Communication   Communication: No difficulties  Cognition Arousal/Alertness: Awake/alert Behavior During Therapy: WFL for tasks assessed/performed Overall Cognitive Status: Within Functional Limits for tasks assessed                                        General Comments      Exercises     Assessment/Plan    PT Assessment Patient needs continued PT services  PT Problem List Decreased strength;Decreased activity tolerance;Decreased balance;Decreased mobility;Decreased knowledge of precautions;Obesity;Pain       PT Treatment Interventions DME instruction;Gait training;Functional mobility training;Therapeutic activities;Therapeutic exercise;Balance training;Patient/family education    PT Goals (Current goals can be found in the Care Plan section)  Acute Rehab PT Goals Patient Stated Goal: less pain PT Goal Formulation: With patient Time For Goal Achievement: 02/25/21 Potential to Achieve Goals: Good    Frequency Min 5X/week   Barriers to discharge        Co-evaluation               AM-PAC PT "6 Clicks" Mobility  Outcome Measure Help needed turning from your back to your side while in a flat bed without using bedrails?: None Help needed moving from lying on your back to sitting on the side of a flat bed without using bedrails?: A Little Help needed moving to and from a bed to a chair (including a wheelchair)?: A Little Help needed standing up from a chair using your arms (e.g., wheelchair or bedside chair)?: A Little Help needed to walk in hospital room?: A Little Help needed climbing 3-5 steps with a  railing? : A Lot 6 Click Score: 18    End of Session Equipment Utilized During Treatment: Gait belt;Back brace Activity Tolerance: Patient limited by pain Patient left: in bed;with call bell/phone within reach Nurse Communication: Mobility status PT Visit Diagnosis: Unsteadiness on feet (R26.81);Muscle weakness (generalized) (M62.81);Difficulty in walking, not elsewhere classified (R26.2);Pain Pain - part of body:  (back)    Time: 2423-5361 PT Time Calculation (min) (ACUTE ONLY): 22 min   Charges:   PT Evaluation $PT Eval Moderate Complexity: 1 Mod          Wyona Almas, PT, DPT Acute Rehabilitation Services Pager [phone] Office [phone]   Deno Etienne 02/11/2021, 5:18 PM

## 2021-02-11 NOTE — Progress Notes (Signed)
Orthopedic Tech Progress Note Patient Details:  [name] December 23, 1961 [id number] Ordered pts brace Patient ID: [name], female   DOB: 1961/02/19, 60 y.o.   MRN: [id number]   Tammy Sours 02/11/2021, 1:51 PM

## 2021-02-11 NOTE — Brief Op Note (Addendum)
02/11/2021  12:19 PM  PATIENT:  [name]  60 y.o. 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 Lanetta Inch  EBL: 250  BLOOD ADMINISTERED:none  DRAINS: none and Urinary Catheter (Foley)   LOCAL MEDICATIONS USED:  MARCAINE 0.25% 1:1  EXPAREL1.3%  Amount: 30 ml  SPECIMEN:  No Specimen  DISPOSITION OF SPECIMEN:  N/A  COUNTS:  YES  TOURNIQUET:  * No tourniquets in log *  DICTATION: .Dragon Dictation  PLAN OF CARE: Admit for overnight observation  PATIENT DISPOSITION:  PACU - hemodynamically stable.   Delay start of Pharmacological VTE agent (>24hrs) due to surgical blood loss or risk of bleeding: yes

## 2021-02-11 NOTE — Anesthesia Procedure Notes (Signed)
Procedure Name: Intubation Date/Time: 02/11/2021 7:59 AM Performed by: Hoy Morn, CRNA Pre-anesthesia Checklist: Patient identified, Emergency Drugs available, Suction available and Patient being monitored Patient Re-evaluated:Patient Re-evaluated prior to induction Oxygen Delivery Method: Circle system utilized Preoxygenation: Pre-oxygenation with 100% oxygen Induction Type: IV induction Ventilation: Mask ventilation without difficulty Grade View: Grade I Tube type: Oral Tube size: 7.0 mm Number of attempts: 1 Airway Equipment and Method: Stylet and Oral airway Placement Confirmation: ETT inserted through vocal cords under direct vision,  positive ETCO2 and breath sounds checked- equal and bilateral Secured at: 22 cm Tube secured with: Tape Dental Injury: Teeth and Oropharynx as per pre-operative assessment

## 2021-02-11 NOTE — H&P (Signed)
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 Symptoms are rated as moderate to severe, and have been worsening.  This is significantly impairing activities of daily living.  She has elected for surgical management.   Past Medical History:  Diagnosis Date  . Anemia   . Aortic valve stenosis, severe   . 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 Asthma   . Blood transfusion    [facility]  . Breast cancer in female [facility])    Right  . CAD (coronary artery disease)    Cath 2010 with DES x 1 RCA-- PT'S CARDIOLOGIST IS DR. Scipio  . Chronic diastolic congestive heart failure ([place])   . COPD (chronic obstructive pulmonary disease) ([place])   . Depression   . Diabetes mellitus DIAGNOSED IN2010   Controll s with diet  . Dyspnea    with much ambulation  . Eczema    on back  . Headache    migraines younger- rare now 02/07/21  . Heart murmur   . History of hiatal hernia   . History of kidney stones    passed or blasted  . Hyperlipidemia   . Hypertension   . Morbid obesity with body mass index of 60.0-69.9 in adult [facility])   . Myocardial infarction ([place])    PT THINKS SHE WAS DX WITH MI AT THE TIME OF HEART STENTING  . Neuromuscular disorder ([place])    bilateral hands  . Pneumonia   . Pulmonary embolism ([place]) 02/08/12   S/P RT TOTAL KNEE ON 02/03/12--ON 02/08/12--DEVELOPED ACUTE SOB AND CHEST PAIN--AND DIAGNOSED WITH  PULMONARY EMBOLUS AND PNEUMONIA  . Restless leg syndrome   . Sleep apnea    uses 3 liters O2 at night   . Uterine fibroid    NO PROBLEMS AT PRESENT FROM THE FIBROIDS-STATES SHE IS POST MENOPAUSAL-LAST MENSES 2010 EXCEPT FOR EPISODE  THIS YR OF BLEEDING RELATED TO FIBROIDS.  Marland Kitchen Weakness    BOTH HANDS - S/P BILATERAL CARPAL TUNNEL RELEASE--BUT STILL HAS WEAKNESS--OFTEN DROPS THINGS   Past Surgical History:  Procedure Laterality Date  . BREAST LUMPECTOMY Right 06/2018  . BREAST LUMPECTOMY WITH RADIOACTIVE SEED AND SENTINEL LYMPH NODE BIOPSY Right 07/19/2018   Procedure: BREAST LUMPECTOMY WITH RADIOACTIVE SEED AND SENTINEL LYMPH NODE BIOPSY;  Surgeon: Alphonsa Overall, MD;  Location: [place];  Service: General;  Laterality: Right;  . CARDIAC CATHETERIZATION    . CARDIAC VALVE REPLACEMENT     2017  . CARPAL TUNNEL RELEASE     Bilateral  . CHOLECYSTECTOMY    . CORONARY ANGIOPLASTY     2010 has stent in place  . CYSTOSCOPY W/ RETROGRADES Right 09/21/2013   Procedure: CYSTOSCOPY WITH RIGHT RETROGRADE PYELOGRAM RIGHT DOUBLE J STENT ;  Surgeon: Fredricka Bonine, MD;  Location: [facility];  Service: Urology;  Laterality: Right;  . CYSTOSCOPY WITH URETEROSCOPY AND STENT PLACEMENT Right 10/25/2013   Procedure: CYSTOSCOPY RIGHT URETEROSCOPY HOLMIUM LASER LITHO AND STENT PLACEMENT;  Surgeon: Fredricka Bonine, MD;  Location: [facility];  Service: Urology;  Laterality: Right;  . HERNIA REPAIR    . INTRAOPERATIVE TRANSESOPHAGEAL ECHOCARDIOGRAM N/A 12/12/2014   Procedure: INTRAOPERATIVE TRANSESOPHAGEAL ECHOCARDIOGRAM;  Surgeon:  Burnell Blanks, MD;  Location: [place];  Service: Cardiovascular;  Laterality: N/A;  . JOINT REPLACEMENT     bil total knees  . KNEE ARTHROPLASTY  02/03/2012   Procedure: COMPUTER ASSISTED TOTAL KNEE ARTHROPLASTY;  Surgeon: Mcarthur Rossetti, MD;  Location: [place];  Service: Orthopedics;  Laterality: Right;  Right total knee arthroplasty  . LEFT AND RIGHT HEART CATHETERIZATION WITH CORONARY ANGIOGRAM N/A 03/17/2013   Procedure: LEFT AND RIGHT HEART CATHETERIZATION WITH CORONARY ANGIOGRAM;  Surgeon: Burnell Blanks, MD;  Location: [facility] CATH LAB;  Service: Cardiovascular;  Laterality: N/A;  . LEFT AND  RIGHT HEART CATHETERIZATION WITH CORONARY/GRAFT ANGIOGRAM N/A 09/14/2014   Procedure: LEFT AND RIGHT HEART CATHETERIZATION WITH Beatrix Fetters;  Surgeon: Burnell Blanks, MD;  Location: [facility] CATH LAB;  Service: Cardiovascular;  Laterality: N/A;  . TEE WITHOUT CARDIOVERSION N/A 03/14/2013   Procedure: TRANSESOPHAGEAL ECHOCARDIOGRAM (TEE);  Surgeon: Lelon Perla, MD;  Location: [facility] ENDOSCOPY;  Service: Cardiovascular;  Laterality: N/A;  . TEE WITHOUT CARDIOVERSION N/A 11/14/2014   Procedure: TRANSESOPHAGEAL ECHOCARDIOGRAM (TEE);  Surgeon: Lelon Perla, MD;  Location[facility] ENDOSCOPY;  Service: Cardiovascular;  Laterality: N/A;  . TONSILLECTOMY     maybe as a child- does not know  . TOTAL KNEE ARTHROPLASTY  09/10/2012   Procedure: TOTAL KNEE ARTHROPLASTY;  Surgeon: Mcarthur Rossetti, MD;  Location: [facility];  Service: Orthopedics;  Laterality: Left;  . TOTAL KNEE REVISION Right 07/15/2013   Procedure: REVISION ARTHROPLASTY RIGHT KNEE;  Surgeon: Mcarthur Rossetti, MD;  Location[facility];  Service: Orthopedics;  Laterality: Right;  . TRANSCATHETER AORTIC VALVE REPLACEMENT, TRANSFEMORAL N/A 12/12/2014   Procedure: TRANSCATHETER AORTIC VALVE REPLACEMENT, TRANSFEMORAL;  Surgeon: Burnell Blanks, MD;  Location: [place];  Service: Cardiovascular;  Laterality: N/A;  . TRIGGER FINGER RELEASE  09/10/2012   Procedure: RELEASE TRIGGER FINGER/A-1 PULLEY;  Surgeon: Mcarthur Rossetti, MD;  Location: [facility];  Service: Orthopedics;  Laterality: Right;  Right Ring Finger  . TUBAL LIGATION     Social History   Socioeconomic History  . Marital status: Married    Spouse name: Not on file  . Number of children: 2  . Years of education: Not on file  . Highest education level: Not on file  Occupational History  . Occupation: Disabled  Tobacco Use  . Smoking status: Former Smoker    Packs/day: 1.50    Years: 30.00    Pack years: 45.00    Types: Cigarettes    Quit date: 12/29/2000     Years since quitting: 20.1  . Smokeless tobacco: Never Used  Vaping Use  . Vaping Use: Never used  Substance and Sexual Activity  . Alcohol use: Not Currently  . Drug use: Not Currently  . Sexual activity: Not Currently    Birth control/protection: Surgical  Other Topics Concern  . Not on file  Social History Narrative  . Not on file   Social Determinants of Health   Financial Resource Strain: Not on file  Food Insecurity: Not on file  Transportation Needs: Not on file  Physical Activity: Not on file  Stress: Not on file  Social Connections: Not on file   Family History  Problem Relation Age of Onset  . Breast cancer Mother        stage IV at diagnosis  . Emphysema Mother        smoked  . Heart disease Mother   . COPD Father        smoked  .  Asthma Father   . Heart disease Father   . Cancer Brother        Sinus   No Known Allergies Prior to Admission medications   Medication Sig Start Date End Date Taking? Authorizing Provider  acetaminophen (TYLENOL) 500 MG tablet Take 1,000 mg by mouth every 6 (six) hours as needed for mild pain.   Yes [provider]  amlodipine-atorvastatin (CADUET) 5-80 MG tablet Take 1 tablet by mouth daily. Patient taking differently: Take 1 tablet by mouth at bedtime. 10/23/20 10/23/21 Yes Aslam, Loralyn Freshwater, MD  Ascorbic Acid (VITAMIN C) 1000 MG tablet Take 1,000 mg by mouth daily.   Yes [provider]  ASPIRIN ADULT LOW STRENGTH 81 MG EC tablet TAKE 1 Tablet BY MOUTH ONCE DAILY Patient taking differently: Take by mouth daily. 10/11/18  Yes Lorella Nimrod, MD  clopidogrel (PLAVIX) 75 MG tablet Take 1 tablet (75 mg total) by mouth every morning. 09/11/20  Yes [name], MD  DULoxetine (CYMBALTA) 60 MG capsule Take 1 capsule (60 mg total) by mouth daily. 10/23/20 07/20/21 Yes Aslam, Loralyn Freshwater, MD  Evolocumab (REPATHA SURECLICK) 947 MG/ML SOAJ Inject 1 pen into the skin every 14 (fourteen) days. Patient taking differently:  Inject 140 mg into the skin every 14 (fourteen) days. 08/28/20  Yes Burnell Blanks, MD  exemestane (AROMASIN) 25 MG tablet TAKE 1 TABLET BY MOUTH ONCE DAILY AFTER BREAKFAST Patient taking differently: Take 25 mg by mouth daily after breakfast. 01/14/21  Yes Magrinat, Virgie Dad, MD  Fluticasone-Umeclidin-Vilant (TRELEGY ELLIPTA) 100-62.5-25 MCG/INH AEPB Inhale 1 puff into the lungs daily. 08/08/20  Yes Lucious Groves, DO  furosemide (LASIX) 40 MG tablet Take 1 tablet (40 mg total) by mouth as needed for fluid or edema. Patient taking differently: Take 40 mg by mouth daily as needed for fluid or edema. 10/23/20  Yes Aslam, Loralyn Freshwater, MD  gabapentin (NEURONTIN) 300 MG capsule TAKE 3 CAPSULES BY MOUTH ONCE DAILY AT BEDTIME Patient taking differently: Take 900 mg by mouth at bedtime. 01/22/21  Yes Aslam, Loralyn Freshwater, MD  HYDROcodone-acetaminophen (NORCO/VICODIN) 5-325 MG tablet Take 1 tablet by mouth every 6 (six) hours as needed for moderate pain. 01/22/21  Yes [name], MD  hydrocortisone cream 1 % Apply 1 application topically daily as needed for itching.    Yes [provider]  liraglutide (VICTOZA) 18 MG/3ML SOPN Inject 0.3 mLs (1.8 mg total) into the skin daily. 07/17/20  Yes Aslam, Loralyn Freshwater, MD  losartan-hydrochlorothiazide (HYZAAR) 100-25 MG tablet Take 1 tablet by mouth daily. 10/23/20 10/23/21 Yes Aslam, Loralyn Freshwater, MD  Melatonin 5 MG CAPS Take 5 mg by mouth at bedtime as needed (insomnia).   Yes [provider]  metoprolol succinate (TOPROL XL) 50 MG 24 hr tablet Take 1 tablet (50 mg total) by mouth daily. Take with or immediately following a meal. 10/23/20 10/23/21 Yes Aslam, Loralyn Freshwater, MD  nitroGLYCERIN (NITROSTAT) 0.4 MG SL tablet Place 1 tablet (0.4 mg total) under the tongue every 5 (five) minutes as needed for chest pain. 01/13/19  Yes Lorella Nimrod, MD  PROAIR HFA 108 (90 Base) MCG/ACT inhaler INHALE 2 PUFFS BY MOUTH EVERY 4 HOURS AS NEEDED FOR COUGH FOR WHEEZING FOR SHORTNESS OF  BREATH Patient taking differently: Inhale 2 puffs into the lungs every 4 (four) hours as needed for shortness of breath or wheezing. 12/10/20  Yes Jean Rosenthal, MD  Accu-Chek FastClix Lancets MISC Check blood sugar two times a day 03/17/19   Lorella Nimrod, MD  albuterol (PROVENTIL) (2.5  MG/3ML) 0.083% nebulizer solution USE 1 VIAL IN NEBULIZER EVERY 4 HOURS AS NEEDED FOR WHEEZING FOR SHORTNESS OF BREATH Patient taking differently: Take 2.5 mg by nebulization every 4 (four) hours as needed for shortness of breath or wheezing. 12/10/20   Maudie Mercury, MD  Blood Glucose Monitoring Suppl (ACCU-CHEK GUIDE) w/Device KIT 1 each by Does not apply route 2 (two) times daily. 03/17/19   Lorella Nimrod, MD  glucose blood (ACCU-CHEK GUIDE) test strip Check blood sugar 2 times per day 03/17/19   Lorella Nimrod, MD  Insulin Pen Needle (PEN NEEDLES) 32G X 4 MM MISC 1.8 mg by Does not apply route daily. 05/31/20   Earlene Plater, MD  OXYGEN Inhale 3 L into the lungs as needed.    [provider]  Fluticasone-Salmeterol (ADVAIR DISKUS) 100-50 MCG/DOSE AEPB Inhale 1 puff into the lungs daily. 02/20/20 05/09/20  Earlene Plater, MD     Positive ROS: All other systems have been reviewed and were otherwise negative with the exception of those mentioned in the HPI and as above.  Physical Exam: General: Alert, no acute distress Cardiovascular: No pedal edema Respiratory: No cyanosis, no use of accessory musculature GI: No organomegaly, abdomen is soft and non-tender Skin: No lesions in the area of chief complaint Neurologic: Sensation intact distally Psychiatric: Patient is competent for consent with normal mood and affect Lymphatic: No axillary or cervical lymphadenopathy  MUSCULOSKELETAL:Left EHL 3/5 right 4/5 bilateral hip abduction weakness.   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   [name], MD Cell [phone] Office [phone] 02/11/2021 7:46 AM

## 2021-02-11 NOTE — Anesthesia Postprocedure Evaluation (Signed)
Anesthesia Post Note  Patient: [name]  Procedure(s) Performed: L4-5 LEFT POSTERIOR INTERBODY FUSION WITH DEPUY MPACT PEDICLE SCREWS, RODS AND CAGES, LOCAL AND ALLOGRAFT BONE GRAFT, VIVIGEN (N/A )     Patient location during evaluation: PACU Anesthesia Type: General Level of consciousness: awake and alert Pain management: pain level controlled Vital Signs Assessment: post-procedure vital signs reviewed and stable Respiratory status: spontaneous breathing, nonlabored ventilation, respiratory function stable and patient connected to nasal cannula oxygen Cardiovascular status: blood pressure returned to baseline and stable Postop Assessment: no apparent nausea or vomiting Anesthetic complications: no   No complications documented.  Last Vitals:  Vitals:   02/11/21 1415 02/11/21 1711  BP:  139/73  Pulse: 88 95  Resp:  17  Temp:    SpO2: 95% 98%    Last Pain:  Vitals:   02/11/21 1844  TempSrc:   PainSc: 6    Pain Goal: Patients Stated Pain Goal: 2 (02/11/21 0624)                 [place]

## 2021-02-11 NOTE — Op Note (Addendum)
02/11/2021  12:23 PM  PATIENT:  [name]  60 y.o. female  MRN: [id number]  OPERATIVE REPORT  PRE-OPERATIVE DIAGNOSIS:  L4-5 spondylolisthesis with spinal stenosis and neurogenic claudication  POST-OPERATIVE DIAGNOSIS:  L4-5 spondylolisthesis with spinal stenosis and neurogenic claudication  PROCEDURE:  Procedure(s): L4-5 LEFT POSTERIOR INTERBODY FUSION WITH DEPUY MPACT PEDICLE SCREWS, RODS AND CAGES, LOCAL AND ALLOGRAFT BONE GRAFT, VIVIGEN    SURGEON:  [name], MD     ASSISTANT: Benjiman Core, PA-C  (Present throughout the entire procedure and necessary for completion of procedure in a timely manner)     ANESTHESIA:  General,supplemented with local marcaine 1:1 exparel total 30CC. Dr. Lanetta Inch.  DRAINS: Foley to SD    COMPLICATIONS:  None.     COMPONENTS:   Implant Name Type Inv. Item Serial No. Manufacturer Lot No. LRB No. Used Action  BONE VIVIGEN FORMABLE 5.4CC - [id number] Bone Implant BONE VIVIGEN FORMABLE 5.4CC [id number] [facility]  N/A 1 Implanted  SCREW VIPER 7X45MM - [id number] Screw SCREW VIPER 7X45MM  [employer]  N/A 4 Implanted  SCREW SET SINGLE INNER - [id number] Screw SCREW SET SINGLE INNER  [employer]  N/A 4 Implanted  ROD PRE BENT EXP 40MM - [id number] Rod ROD PRE BENT EXP 40MM  [employer]  N/A 2 Implanted  [id number] LT x-pac Cage      N/A 1 Implanted  CONNECTOR CROSS SFX A2 - [id number] Connector CONNECTOR CROSS SFX A2  [employer]  N/A 1 Implanted    PROCEDURE:The patient was met in the holding area, and the appropriate lumbar level Left L4-5  identified and marked with an x and my initials.The patient was then transported to OR. The patient was then placed under general anesthesia without difficulty.The patient received appropriate preoperative antibiotic prophylaxis ancef.  Nursing staff inserted a Foley catheter under sterile conditions. She was then turned to a prone position Anatone  spine table was used for this case. All pressure points were well padded PAS stocking applied bilateral lower extremity to prevent DVT. Standard prep DuraPrep solution. Draped in the usual manner. Time-out procedure was called and correct .   The incision was at L4-5 and determined using C-arm to mark the inferior L5 pedicles and  an additionally extended up to the L3 spinous process.   Bovie electric cautery was used to control bleeding and carefully dissection was carried down along the lateral aspects of the spinous process of L3 to L5. Cobb then used to carefully elevate the paralumbar muscle and the incision in the midline was carried to to the level of the base of the residual spinous processes. The posterior exposure area extending from the base of the spinous process of L3 to the superior aspect of L5 was carried expose at its edges debrided the muscle attachments using a Leskell. Time-out procedure was called and correct. Skin in the midline between L2 and L5 was then infiltrated with local anesthesia, marcaine 1/2% 1:1 exparel 1.3% total 20 cc used. Incision was then made  extending from L2-L5  through the skin and subcutaneous layers down to the patient's lumbodorsal fascia and spinous processes. The incision then carried sharply excising the supraspinous ligament and then continuing the lateral aspect of the spinous processes of L3,L4 and L5. Cobb elevator used to carefully elevate the paralumbar muscles off of the posterior elements using electrocautery carefully drilled bleeding and perform dissection of the muscle tissues of the preserving the facet capsule  at the L3-4. Continuing the exposure out laterally to expose the lateral margin of the facet joint line at L4-5 and L3-4. Incision was carried in the midline down to the L5 level area bleeders controlled using electrocautery monopolar electrocautery.    C-arm fluoroscopy was then brought into the field and using C-arm fluoroscopy then a hole  made into the medial aspect of the pedicle of L4 using a high speed burr observed in the pedicle using C arm at the 5 oclock position on the left L4 pedicle nerve probe initial entry was determined on fluoroscopy to be good position alignment so that a 4.65m tap was passed to 4-0 mm within the left L4 pedicle to a depth of nearly 45 mm observed on C-arm fluoroscopy to be beyond the midpoint of the lumbar vertebra and then position alignment within the left L4 pedicle this was then removed and the pedicle channel probed demonstrating patency no sign of rupture the cortex of the pedicle. Tapping with a 4.35 mm screw tap then 5 mm tap then a 6 mm and 735mtap a 7.0 mm x 45 mm screw was reserved for later placement on the left side pedicle at the L4 level. C-arm fluoroscopy was then brought into the field and using C-arm fluoroscopy then a hole made into the posterior medial aspect of the pedicle of right L4 observed in the pedicle using ball tipped nerve hook and hockey stick nerve probe initial entry was determined on fluoroscopy to be good position alignment so that 4.17m22map was then used to tap the right L4 pedicle to a depth of nearly 45 mm observed on C-arm fluoroscopy to be beyond the midpoint of the lumbar vertebra and then position alignment within the right L4 pedicle this was then removed and the pedicle channel probed demonstrating patency no sign of rupture the cortex of the pedicle. Tapping with a 5 mm screw tap then tapping with a 6.0 mm and 7.17mm108mp, a 7.0 mm x 45 mm screw was placeed.C-arm fluoroscopy was then brought into the field and using C-arm fluoroscopy then a hole made into the posterior and medial aspect of the left pedicle of L5 observed in the pedicle using ball tipped nerve hook and hockey stick nerve probe initial entry was determined on fluoroscopy to be good position alignment so that a 4.35 mm tap was then used to tap the left L4 pedicle to a depth of nearly 45 mm observed on C-arm  fluoroscopy to be beyond the posterior one third of the lumbar vertebra and good position alignment within the left L5 pedicle this was then removed and the pedicle channel probed demonstrating patency no sign of rupture the cortex of the pedicle. Tapping with a 4.0 mm screw tap then a 5.0 mm tap then tapping with a 6.0 mm and 7.17mm 43m a 7.17mm x45m mm screw was saved for later placement on the left side at the L5 level. The pedicle channel of L5 on the left probed demonstrating patency no sign of rupture the cortex of the pedicle.C-arm fluoroscopy was then brought into the field and using C-arm fluoroscopy then a hole made into the posterior and medial aspect of the right pedicle of L5 observed in the pedicle using ball tipped nerve hook and hockey stick nerve probe initial entry was determined on fluoroscopy to be good position alignment so that a 4.35mm t55mas then used to tap the right L5 pedicle to a depth of nearly 45 mm observed on  C-arm fluoroscopy to be beyond the posterior one third of the lumbar vertebra and good position alignment within the right L5 pedicle this was then removed and the pedicle channel probed demonstrating patency no sign of rupture the cortex of the pedicle. Tapping with a 4.27m screw tap then up to a 758mtap then 7.46m57m 45 mm screw was placed on the right side at the L5 level. The pedicle channel of L5 on the right probed demonstrating patency no sign of rupture the cortex of the pedicle. Viper screw for fixation of this level was measured as 7.0 mm x 45 mm screw inserted.    Spinous processes of  L4 inferior 50%and superior 20% of L5 were then resected down to the base the lamina at each segment.  Leksell rongeur used to resect inferior aspect of the lamina on the left side at the L4 level and partially on the right side at L4 The left medial 40% of the facets of L4-5 were resected in order to decompress the left and right side of the lumbar thecal sac at L4-5 and decompress the  bilateral  L4 and L5 neuroforamen. Osteotomes and 2mm62md 3mm 79mrisons were used for this portion of the decompression. Similarly the right side decompression was carried out but  Near complete facetectomy was perform on the left at L4-5 to provide for exposure of the left side L4-5 neuroforamen for ease of placement of TLIF (transforaminal lumbar interbody fusion) at the L4 level inferior portions of the lamina and pars were also resected first beginning with the Leksell rongeur and osteotomes and then resecting using 2 and 3 mm Kerrison. Continued laminectomy was carried out resecting the central portions of the lamina of L4 and upper L5 performing foraminotomies on the right side at the L4 and L5 levels. The inferior articular process  L4 was resected on the right side.  A large amount of hypertrophic ligmentum flavum was found impressing on the right lateral recesses at L4-5 and narrowing the respective L4 and L5 neuroforamen.  Loupe magnification and headlight were used during this portion procedure. Then the operating room microscope sterilely draped and brought into the field.  Attention then turned to placement of the transforaminal lumbar interbody fusion cage.  Bleeding controlled using bipolar electrocautery thrombin soaked gel cottonoids. Then turned to the left L4-5 level the exposure the posterior lateral aspect this was carried out using a Penfield 4 bipolar electrocautery to control small bleeders present. Derricho retractor used to retract the thecal sac and L4 nerve root a 15 blade scalpel was used to incise posterior lateral aspect of the left L4-5 disc the disc space at this level showed a rather severe narrowing posteriorly was more open anteriorly so that an osteotome again was used to resect a small portion the posterior superior lip of the vertebral body at L5  in order to gain ease of access into the L4-5 disc space. The space was debrided of degenerative disc material using pituitary along  root the entire disc space was then debrided of degenerative disc material using pituitary rongeurs curettage down to bleeding bone endplates. 7mm,824m,9mm an39m46mm sh29ms were used to debride the disc space and pituitary ronguers used to remove the loosened debris. This space was then carefully assess using spacers  a 10.46mm tria93mage provided the best fit, the Depuy adjustable cage 9mm x 62m14ms ch68m so that the permanent adjustable lordotic 9.0-14mm mm cage by 28 mm concorde cage was introduced into the disc  space then packed with local bone graft and vivigen and cancellous allograft chips were placed into the intervertebral disc space. Bleeding controlled using bipolar electrocautery.  Observed on C-arm fluoroscopy to be in good position alignment. The cage at L4-5 was placed anteriorly as best as possible the correct patient's lordosis. The cage was then raised with the inserted screw driver and the height adjustment mechanism deployed. The cage was then filled with vivigen bone graft. With this then the transforaminal lumbar interbody fusion portion of the case was completed bleeders were controlled using bipolar electrocautery thrombin-soaked Gelfoam were appropriate.Decortication of the right facet joint carried out at L4-5. These were packed with cancellous local bone graft.  The 2 viper corticofixation screws on the left were each placed and then each fastener carefully aligned  to allow for placement of rods. The left rod was a precontoured 40 mm rod. This was then placed into the pedicle screws on the left extending from L4-L5 each of the caps were carefully placed loosely tightened.Caps onto the left L4 fastener was tightened to 80 foot lbs. Across the right side a precontoured 68m titanium rod was placed into the L4 and L5  screw fasteners and the upper cap tightened to 80 foot lbs., compression was obtained on the right side between L5 and L4 compressing between the fasteners and tightening the  screw caps 85 pounds.Copious amounts of saline solution this was done throughout the case. As the left L5 pedicle was weak a crosslink was measures at A2 and a portion of the inferior spinous process of L4 was removed for placement of the crosslink. The crosslink then placed and the torque screw driver used to set the hookscrews and the central posterior crosslink length adjustment screw. Cell Saver was used during the case. No cell saver blood returned to the patient.  Hockey stick  neuroprobe was used to probe the neuroforamen bilateral L4 and L5 these were determined to be well decompressed. Permanent C-arm images were obtained in AP and lateral plane and oblique planes. Remaining local bone graft was then applied along both lateral posterior lateral region extending from right L4 to L5 facet bed.Gelfoam was then removed spinal canal. The lumbodorsal musculature carefully exam debrided of any devitalized tissue following removal of self retaining retractors were the bleeders were controlled using electrocautery and the area dorsal lumbar muscle were then approximated in the midline with interrupted #1 Vicryl sutures loose the dorsal fascia was reattached to the spinous process of L3  superiorly and L5  inferiorly this was done with #1 Vicryl sutures. Subcutaneous layers then approximated using interrupted 0 Vicryl sutures and 2-0 Vicryl sutures. Skin was closed with a running subcutaneous stitch of 4-0 Vicryl Dermabond was applied then MedPlex bandage. All instrument and sponge counts were correct. The patient was then returned to a supine position on her bed reactivated extubated and returned to the recovery room in satisfactory condition.    JBenjiman CorePA-C perform the duties of assistant surgeon during this case. He was present from the beginning of the case to the end of the case assisting in transfer the patient from his stretcher to the OR table and back to the stretcher at the end of the case. Assisted  in careful retraction and suction of the laminectomy site delicate neural structures operating under the operating room microscope. He performed closure of the incision from the fascia to the skin applying the dressing.     @ 02/11/2021,12:23 PM

## 2021-02-11 NOTE — Interval H&P Note (Signed)
History and Physical Interval Note:  02/11/2021 7:47 AM  [name]  has presented today for surgery, with the diagnosis of L4-5 spondylolisthesis with spinal stenosis and neurogenic claudication.  The various methods of treatment have been discussed with the patient and family. After consideration of risks, benefits and other options for treatment, the patient has consented to  Procedure(s): L4-5 LEFT POSTERIOR INTERBODY FUSION WITH DEPUY MPACT PEDICLE SCREWS, RODS AND CAGES, LOCAL AND ALLOGRAFT BONE GRAFT, VIVIGEN (N/A) as a surgical intervention.  The patient's history has been reviewed, patient examined, no change in status, stable for surgery.  I have reviewed the patient's chart and labs.  Questions were answered to the patient's satisfaction.     [name]

## 2021-02-11 NOTE — Discharge Instructions (Addendum)

## 2021-02-12 LAB — CBC
HCT: 31.8 % — ABNORMAL LOW (ref 36.0–46.0)
Hemoglobin: 10.9 g/dL — ABNORMAL LOW (ref 12.0–15.0)
MCH: 30.2 pg (ref 26.0–34.0)
MCHC: 34.3 g/dL (ref 30.0–36.0)
MCV: 88.1 fL (ref 80.0–100.0)
Platelets: 146 10*3/uL — ABNORMAL LOW (ref 150–400)
RBC: 3.61 MIL/uL — ABNORMAL LOW (ref 3.87–5.11)
RDW: 13 % (ref 11.5–15.5)
WBC: 8.9 10*3/uL (ref 4.0–10.5)
nRBC: 0 % (ref 0.0–0.2)

## 2021-02-12 LAB — BASIC METABOLIC PANEL
Anion gap: 8 (ref 5–15)
BUN: 17 mg/dL (ref 8–23)
CO2: 28 mmol/L (ref 22–32)
Calcium: 8.7 mg/dL — ABNORMAL LOW (ref 8.9–10.3)
Chloride: 101 mmol/L (ref 98–111)
Creatinine, Ser: 0.81 mg/dL (ref 0.44–1.00)
GFR, Estimated: >60 mL/min (ref >60)
Glucose, Bld: 257 mg/dL — ABNORMAL HIGH (ref 70–99)
Potassium: 4 mmol/L (ref 3.5–5.1)
Sodium: 137 mmol/L (ref 135–145)

## 2021-02-12 LAB — GLUCOSE, CAPILLARY
Glucose-Capillary: 259 mg/dL — ABNORMAL HIGH (ref 70–99)
Glucose-Capillary: 242 mg/dL — ABNORMAL HIGH (ref 70–99)
Glucose-Capillary: 252 mg/dL — ABNORMAL HIGH (ref 70–99)
Glucose-Capillary: 239 mg/dL — ABNORMAL HIGH (ref 70–99)

## 2021-02-12 LAB — HEMOGLOBIN A1C
Hgb A1c MFr Bld: 7.3 % — ABNORMAL HIGH (ref 4.8–5.6)
Mean Plasma Glucose: 163 mg/dL

## 2021-02-12 MED ORDER — FERROUS GLUCONATE 324 (38 FE) MG PO TABS
324.0000 mg | ORAL_TABLET | Freq: Two times a day (BID) | ORAL | Status: DC
  Administered 2021-02-12 – 2021-02-13 (×2): 324 mg via ORAL
  Filled 2021-02-12 (×3): qty 1

## 2021-02-12 MED ORDER — INSULIN GLARGINE 100 UNIT/ML ~~LOC~~ SOLN
30.0000 [IU] | Freq: Every day | SUBCUTANEOUS | Status: DC
  Administered 2021-02-13: 30 [IU] via SUBCUTANEOUS
  Filled 2021-02-12 (×4): qty 0.3

## 2021-02-12 MED ORDER — INSULIN ASPART 100 UNIT/ML ~~LOC~~ SOLN
6.0000 [IU] | Freq: Three times a day (TID) | SUBCUTANEOUS | Status: DC
  Administered 2021-02-12 – 2021-02-13 (×2): 6 [IU] via SUBCUTANEOUS

## 2021-02-12 MED FILL — Thrombin (Recombinant) For Soln 20000 Unit: CUTANEOUS | Qty: 1 | Status: AC

## 2021-02-12 NOTE — Progress Notes (Signed)
     Subjective: 1 Day Post-Op Procedure(s) (LRB): L4-5 LEFT POSTERIOR INTERBODY FUSION WITH DEPUY MPACT PEDICLE SCREWS, RODS AND CAGES, LOCAL AND ALLOGRAFT BONE GRAFT, VIVIGEN (N/A) Awake, alert and oriented x 4. She is running elevated BSs. Complains of pain that is present with moving. Gone with lying flat and with sitting. No Bm. Walked to her room door only last night. PT yet to see today. Has two stairs into house and in not in hallway as yet.   Patient reports pain as moderate.    Objective:   VITALS:  Temp:  [97.1 F (36.2 C)-98.8 F (37.1 C)] 97.9 F (36.6 C) (02/15 0732) Pulse Rate:  [85-98] 98 (02/15 0732) Resp:  [15-20] 18 (02/15 0732) BP: (96-176)/(47-95) 128/53 (02/15 0732) SpO2:  [92 %-100 %] 95 % (02/15 0735)  Neurologically intact ABD soft Neurovascular intact Sensation intact distally Intact pulses distally Dorsiflexion/Plantar flexion intact Incision: dressing C/D/I, no drainage and new waffle dressing applied   LABS Recent Labs    02/11/21 0609 02/12/21 0415  HGB 13.8 10.9*  WBC 7.3 8.9  PLT 191 146*   Recent Labs    02/11/21 0609 02/12/21 0415  NA 139 137  K 4.0 4.0  CL 100 101  CO2 28 28  BUN 14 17  CREATININE 0.96 0.81  GLUCOSE 187* 257*   No results for input(s): LABPT, INR in the last 72 hours.   Assessment/Plan: 1 Day Post-Op Procedure(s) (LRB): L4-5 LEFT POSTERIOR INTERBODY FUSION WITH DEPUY MPACT PEDICLE SCREWS, RODS AND CAGES, LOCAL AND ALLOGRAFT BONE GRAFT, VIVIGEN (N/A)  Advance diet Up with therapy D/C IV fluids  Continue with PT, I will check on her this afternoon and likely discharge at time.   [name] 02/12/2021, 8:26 AMPatient ID: [name], female   DOB: September 28, 1961, 60 y.o.   MRN: [id number]

## 2021-02-12 NOTE — Progress Notes (Signed)
     Subjective: 1 Day Post-Op Procedure(s) (LRB): L4-5 LEFT POSTERIOR INTERBODY FUSION WITH DEPUY MPACT PEDICLE SCREWS, RODS AND CAGES, LOCAL AND ALLOGRAFT BONE GRAFT, VIVIGEN (N/A) Walking only 30 feet today, no stairs yet, slow in beginning PT and not ready for discharge. No home health Available via insurer, [place]. Will keep one more day, encourage to move feet and cough and deep breath. Complains of left shoulder pain and rotator cuff problem.  Voiding without difficulty.   Patient reports pain as moderate.    Objective:   VITALS:  Temp:  [97.9 F (36.6 C)-98.8 F (37.1 C)] 97.9 F (36.6 C) (02/15 0732) Pulse Rate:  [89-98] 98 (02/15 0732) Resp:  [16-18] 18 (02/15 0732) BP: (96-139)/(47-73) 128/53 (02/15 0732) SpO2:  [95 %-98 %] 95 % (02/15 0735)  Neurologically intact ABD soft Neurovascular intact Sensation intact distally Intact pulses distally Dorsiflexion/Plantar flexion intact Incision: scant drainage No cellulitis present   LABS Recent Labs    02/11/21 0609 02/12/21 0415  HGB 13.8 10.9*  WBC 7.3 8.9  PLT 191 146*   Recent Labs    02/11/21 0609 02/12/21 0415  NA 139 137  K 4.0 4.0  CL 100 101  CO2 28 28  BUN 14 17  CREATININE 0.96 0.81  GLUCOSE 187* 257*   No results for input(s): LABPT, INR in the last 72 hours.   Assessment/Plan: 1 Day Post-Op Procedure(s) (LRB): L4-5 LEFT POSTERIOR INTERBODY FUSION WITH DEPUY MPACT PEDICLE SCREWS, RODS AND CAGES, LOCAL AND ALLOGRAFT BONE GRAFT, VIVIGEN (N/A)  Advance diet Up with therapy Plan for discharge tomorrow  [name] 02/12/2021, 3:40 PMPatient ID: [name], female   DOB: 12-29-1961, 60 y.o.   MRN: [id number]

## 2021-02-12 NOTE — Progress Notes (Signed)
Physical Therapy Treatment Patient Details Name: [name] MRN: [id number] DOB: 1961/09/09 Today's Date: 02/12/2021    History of Present Illness Pt is a 60 y.o. F with significant PMH of CAD, COPD, depression, diabetes mellitus, PE, bilateral TKA's who presents s/p L L4-5 PLIF.    PT Comments    Pt seen for a second session in attempt to progress mobility. Pt did tolerate gait training a further distance (~100' total) but continues to be limited by pain and decreased tolerance for functional activity. Continue to feel this patient would benefit from another night in the hospital to work with therapy again prior to d/c, as she will likely not get home health follow-up with Medicaid. Will continue to follow and progress as able per POC.    Follow Up Recommendations  Home health PT;Supervision/Assistance - 24 hour     Equipment Recommendations  Rolling walker with 5" wheels;3in1 (PT) (bariatric)    Recommendations for Other Services       Precautions / Restrictions Precautions Precautions: Back;Fall Precaution Booklet Issued: Yes (comment) Precaution Comments: Reviewed handout with pt and she was cued for precautions during functional mobility. Required Braces or Orthoses: Spinal Brace Spinal Brace: Lumbar corset Restrictions Weight Bearing Restrictions: No    Mobility  Bed Mobility Overal bed mobility: Needs Assistance Bed Mobility: Rolling;Sidelying to Sit;Sit to Sidelying Rolling: Supervision Sidelying to sit: Min guard     Sit to sidelying: Min assist General bed mobility comments: Pt utilizing railings for all bed mobility. She was able to transition to EOB without assistance (min guard for safety), however assist provided for LE elevation back up into bed at end of session.    Transfers Overall transfer level: Needs assistance Equipment used: Rolling walker (2 wheeled) Transfers: Sit to/from Stand Sit to Stand: Min guard         General transfer  comment: Increased time due to pain. VC's for hand placement on seated surface for safety. Pt was able to power-up to full stand without assistance.  Ambulation/Gait Ambulation/Gait assistance: Min guard Gait Distance (Feet): 100 Feet Assistive device: Rolling walker (2 wheeled) Gait Pattern/deviations: Step-through pattern;Decreased stride length;Trunk flexed Gait velocity: decreased Gait velocity interpretation: <1.31 ft/sec, indicative of household ambulator General Gait Details: x2 standing rest breaks. Pt able to tolerate increased distance this session however continues to be limited by pain.   Stairs             Wheelchair Mobility    Modified Rankin (Stroke Patients Only)       Balance Overall balance assessment: Needs assistance Sitting-balance support: Feet supported Sitting balance-Leahy Scale: Fair     Standing balance support: Bilateral upper extremity supported Standing balance-Leahy Scale: Poor Standing balance comment: Reliant on BUE on RW.                            Cognition Arousal/Alertness: Awake/alert Behavior During Therapy: WFL for tasks assessed/performed Overall Cognitive Status: Within Functional Limits for tasks assessed                                        Exercises      General Comments        Pertinent Vitals/Pain Pain Assessment: Faces Faces Pain Scale: Hurts even more Pain Location: surgical site, back Pain Descriptors / Indicators: Operative site guarding;Grimacing Pain Intervention(s): Limited activity within patient's  tolerance;Monitored during session;Repositioned    Home Living                      Prior Function            PT Goals (current goals can now be found in the care plan section) Acute Rehab PT Goals Patient Stated Goal: less pain PT Goal Formulation: With patient Time For Goal Achievement: 02/25/21 Potential to Achieve Goals: Good Progress towards PT goals:  Progressing toward goals    Frequency    Min 5X/week      PT Plan Current plan remains appropriate    Co-evaluation              AM-PAC PT "6 Clicks" Mobility   Outcome Measure  Help needed turning from your back to your side while in a flat bed without using bedrails?: None Help needed moving from lying on your back to sitting on the side of a flat bed without using bedrails?: A Little Help needed moving to and from a bed to a chair (including a wheelchair)?: A Little Help needed standing up from a chair using your arms (e.g., wheelchair or bedside chair)?: A Little Help needed to walk in hospital room?: A Little Help needed climbing 3-5 steps with a railing? : A Lot 6 Click Score: 18    End of Session Equipment Utilized During Treatment: Gait belt;Back brace Activity Tolerance: Patient limited by pain Patient left: in chair;with call bell/phone within reach Nurse Communication: Mobility status PT Visit Diagnosis: Unsteadiness on feet (R26.81);Muscle weakness (generalized) (M62.81);Difficulty in walking, not elsewhere classified (R26.2);Pain Pain - part of body:  (back)     Time: 1157-2620 PT Time Calculation (min) (ACUTE ONLY): 15 min  Charges:  $Gait Training: 8-22 mins                     [name], PT, DPT Acute Rehabilitation Services Pager: [phone] Office: [phone]    Thelma Comp 02/12/2021, 2:47 PM

## 2021-02-12 NOTE — Progress Notes (Signed)
Inpatient Diabetes Program Recommendations  AACE/ADA: New Consensus Statement on Inpatient Glycemic Control   Target Ranges:  Prepandial:   less than 140 mg/dL      Peak postprandial:   less than 180 mg/dL (1-2 hours)      Critically ill patients:  140 - 180 mg/dL   Results for [name], [name] (MRN [id number]) as of 02/12/2021 10:43  Ref. Range 02/11/2021 06:08 02/11/2021 09:23 02/11/2021 12:27 02/11/2021 17:11 02/11/2021 21:09 02/12/2021 06:28  Glucose-Capillary Latest Ref Range: 70 - 99 mg/dL 203 (H) 169 (H) 204 (H) 248 (H) 222 (H) 259 (H)   Review of Glycemic Control  Diabetes history: DM2 Outpatient Diabetes medications: Victoza 1.8 mg daily Current orders for Inpatient glycemic control: Novolog 0-20 units TID with meals, Novolog 0-5 units QHS, Novolog 4 units TID with meals for meal coverage  Inpatient Diabetes Program Recommendations:    Insulin: While inpatient, please consider ordering Lantus 10 units Q24H.  Thanks, Barnie Alderman, RN, MSN, CDE Diabetes Coordinator Inpatient Diabetes Program [phone] (Team Pager from 8am to 5pm)

## 2021-02-12 NOTE — Evaluation (Signed)
Occupational Therapy Evaluation Patient Details Name: [name] MRN: [id number] DOB: 01/30/1961 Today's Date: 02/12/2021    History of Present Illness Pt is a 60 y.o. F with significant PMH of CAD, COPD, depression, diabetes mellitus, PE, bilateral TKA's who presents s/p L L4-5 PLIF.   Clinical Impression   PTA patient was living with her spouse, daughter, son-in-law and another roommate and was requiring some assistance with ADLs and shower transfers using SPC. Patient states ability to ambulate short-distances at baseline and reports use of motorized grocery cart in stores. Patient currently presents slightly below baseline level of function demonstrating Min A grossly for ADLs and ADL transfers. OT provided education on spinal precautions, home set-up to maximize safety and independence with self-care tasks, and acquisition/use of AE. Patient expressed verbal understanding but would benefit from continued education. Patient able to don aspen lumbar corset with heavy Min A and cues for sequencing. Patient would benefit from continued acute OT services in prep for safe d/c home with family. Recommendation for HHOT.     Follow Up Recommendations  Home health OT;Supervision - Intermittent    Equipment Recommendations  3 in 1 bedside commode    Recommendations for Other Services       Precautions / Restrictions Precautions Precautions: Back;Fall Precaution Booklet Issued: Yes (comment) Precaution Comments: Verbally reviewed. Patient able to recal 1/3 initially. Min cues for adherence during ADLs. Required Braces or Orthoses: Spinal Brace Spinal Brace: Lumbar corset Restrictions Weight Bearing Restrictions: No      Mobility Bed Mobility Overal bed mobility: Needs Assistance Bed Mobility: Rolling;Sidelying to Sit;Sit to Sidelying Rolling: Supervision Sidelying to sit: Min assist       General bed mobility comments: HOB flat with verbal and tactile cues for log rolling.  Patient able to elevated trunk with increased time and Min A. Heavy use of bed rail.    Transfers Overall transfer level: Needs assistance Equipment used: Rolling walker (2 wheeled) Transfers: Sit to/from Stand Sit to Stand: Min assist         General transfer comment: Min A for sit to stand from EOB x4 trials. Cues for hand placement and power negotiation.    Balance Overall balance assessment: Needs assistance Sitting-balance support: Feet supported Sitting balance-Leahy Scale: Fair     Standing balance support: Bilateral upper extremity supported Standing balance-Leahy Scale: Poor Standing balance comment: Reliant on BUE on RW.                           ADL either performed or assessed with clinical judgement   ADL Overall ADL's : Needs assistance/impaired                 Upper Body Dressing : Set up;Sitting   Lower Body Dressing: Minimal assistance;Sit to/from stand Lower Body Dressing Details (indicate cue type and reason): Patient able to thread BLE without AD. Good adherence to back precautions. Min A for sit to stand and steadying to hike pants over hips. Toilet Transfer: Designer, television/film set Details (indicate cue type and reason): Simulated with transfer to recliner using RW. Cues for hand placement.         Functional mobility during ADLs: Min guard;Rolling walker General ADL Comments: Patient limited by decreased activity tolerance, decreased STM, and need for frequent seated rest breaks.     Vision Baseline Vision/History: Wears glasses Patient Visual Report: No change from baseline       Perception  Praxis      Pertinent Vitals/Pain Pain Assessment: Faces Faces Pain Scale: Hurts even more Pain Location: surgical site Pain Descriptors / Indicators: Operative site guarding;Grimacing Pain Intervention(s): Limited activity within patient's tolerance;Monitored during session;Premedicated before session;Repositioned      Hand Dominance Right   Extremity/Trunk Assessment Upper Extremity Assessment Upper Extremity Assessment: Generalized weakness   Lower Extremity Assessment Lower Extremity Assessment: Generalized weakness   Cervical / Trunk Assessment Cervical / Trunk Assessment: Other exceptions Cervical / Trunk Exceptions: increased body habitus, s/p PLIF   Communication Communication Communication: No difficulties   Cognition Arousal/Alertness: Awake/alert Behavior During Therapy: WFL for tasks assessed/performed Overall Cognitive Status: Within Functional Limits for tasks assessed                                     General Comments       Exercises     Shoulder Instructions      Home Living Family/patient expects to be discharged to:: Private residence Living Arrangements: Spouse/significant other Available Help at Discharge: Family (spouse, daughter) Type of Home: House Home Access: Stairs to enter;Ramped entrance Entrance Stairs-Number of Steps: 2 ((also has ramp))   Home Layout: One level     Bathroom Shower/Tub: Teacher, early years/pre: Standard     Home Equipment: Environmental consultant - 4 wheels;Cane - single point;Shower seat;Grab bars - tub/shower;Wheelchair - manual;Tub bench          Prior Functioning/Environment Level of Independence: Independent with assistive device(s)        Comments: Using cane. Walks short distances. Use of motorized carts in grocery store.        OT Problem List: Decreased strength;Decreased activity tolerance;Impaired balance (sitting and/or standing);Decreased safety awareness;Decreased knowledge of use of DME or AE;Obesity;Pain      OT Treatment/Interventions: Self-care/ADL training;Therapeutic exercise;Energy conservation;DME and/or AE instruction;Therapeutic activities;Patient/family education;Balance training    OT Goals(Current goals can be found in the care plan section) Acute Rehab OT Goals Patient Stated  Goal: less pain OT Goal Formulation: With patient Time For Goal Achievement: 02/26/21 Potential to Achieve Goals: Good ADL Goals Pt Will Perform Lower Body Dressing: with supervision;sit to/from stand;with adaptive equipment Pt Will Transfer to Toilet: with supervision;ambulating;bedside commode Pt Will Perform Toileting - Clothing Manipulation and hygiene: with supervision;with adaptive equipment;sit to/from stand Additional ADL Goal #1: Patient will recall 3/3 back precautions without cueing in prep for ADLs.  OT Frequency: Min 2X/week   Barriers to D/C:            Co-evaluation              AM-PAC OT "6 Clicks" Daily Activity     Outcome Measure Help from another person eating meals?: None Help from another person taking care of personal grooming?: A Little Help from another person toileting, which includes using toliet, bedpan, or urinal?: A Little Help from another person bathing (including washing, rinsing, drying)?: A Lot Help from another person to put on and taking off regular upper body clothing?: A Little Help from another person to put on and taking off regular lower body clothing?: A Little 6 Click Score: 18   End of Session Equipment Utilized During Treatment: Rolling walker;Back brace Nurse Communication: Mobility status  Activity Tolerance: Patient tolerated treatment well Patient left: in bed;with call bell/phone within reach  OT Visit Diagnosis: Unsteadiness on feet (R26.81);Other abnormalities of gait and mobility (R26.89);Muscle weakness (generalized) (M62.81)  Time: 6720-9198 OT Time Calculation (min): 24 min Charges:  OT General Charges $OT Visit: 1 Visit OT Evaluation $OT Eval Moderate Complexity: 1 Mod OT Treatments $Self Care/Home Management : 8-22 mins  Erron Wengert H. OTR/L Supplemental OT, Department of rehab services [phone]  Merrell Rettinger R H. 02/12/2021, 8:18 AM

## 2021-02-12 NOTE — Progress Notes (Signed)
Physical Therapy Treatment Patient Details Name: [name] MRN: [id number] DOB: Sep 03, 1961 Today's Date: 02/12/2021    History of Present Illness Pt is a 60 y.o. F with significant PMH of CAD, COPD, depression, diabetes mellitus, PE, bilateral TKA's who presents s/p L L4-5 PLIF.    PT Comments    Pain limiting progress towards physical therapy goals. She was calling out in pain as soon as she initiated the transfer to standing and remained very painful throughout the entirety of her PT session. I do think pt can benefit from an additional PT session tomorrow prior to d/c home. She does, however, have a wheelchair and ramp at home so feel pt would be safe overall if she ended up discharging today. Will continue to follow and progress as able per POC.    Follow Up Recommendations  Home health PT;Supervision/Assistance - 24 hour     Equipment Recommendations  Rolling walker with 5" wheels;3in1 (PT) (bariatric)    Recommendations for Other Services       Precautions / Restrictions Precautions Precautions: Back;Fall Precaution Booklet Issued: Yes (comment) Precaution Comments: Reviewed handout with pt and she was cued for precautions during functional mobility. Required Braces or Orthoses: Spinal Brace Spinal Brace: Lumbar corset Restrictions Weight Bearing Restrictions: No    Mobility  Bed Mobility               General bed mobility comments: Pt received sitting up in the recliner.    Transfers Overall transfer level: Needs assistance Equipment used: Rolling walker (2 wheeled) Transfers: Sit to/from Stand Sit to Stand: Min assist         General transfer comment: Steadying assist provided for power-up to full stand from recliner.  Ambulation/Gait Ambulation/Gait assistance: Min guard Gait Distance (Feet): 50 Feet Assistive device: Rolling walker (2 wheeled) Gait Pattern/deviations: Step-through pattern;Decreased stride length;Trunk flexed Gait  velocity: decreased Gait velocity interpretation: <1.31 ft/sec, indicative of household ambulator General Gait Details: Pt ambulated 25' before requiring standing rest break and to turn around for return to room. Pt with 3 standing rest breaks total   Stairs             Wheelchair Mobility    Modified Rankin (Stroke Patients Only)       Balance Overall balance assessment: Needs assistance Sitting-balance support: Feet supported Sitting balance-Leahy Scale: Fair     Standing balance support: Bilateral upper extremity supported Standing balance-Leahy Scale: Poor Standing balance comment: Reliant on BUE on RW.                            Cognition Arousal/Alertness: Awake/alert Behavior During Therapy: WFL for tasks assessed/performed Overall Cognitive Status: Within Functional Limits for tasks assessed                                        Exercises      General Comments        Pertinent Vitals/Pain Pain Assessment: Faces Faces Pain Scale: Hurts whole lot Pain Location: surgical site, back Pain Descriptors / Indicators: Operative site guarding;Grimacing Pain Intervention(s): Limited activity within patient's tolerance;Monitored during session;Repositioned    Home Living                      Prior Function            PT Goals (current  goals can now be found in the care plan section) Acute Rehab PT Goals Patient Stated Goal: less pain PT Goal Formulation: With patient Time For Goal Achievement: 02/25/21 Potential to Achieve Goals: Good Progress towards PT goals: Progressing toward goals    Frequency    Min 5X/week      PT Plan Current plan remains appropriate    Co-evaluation              AM-PAC PT "6 Clicks" Mobility   Outcome Measure  Help needed turning from your back to your side while in a flat bed without using bedrails?: None Help needed moving from lying on your back to sitting on the side  of a flat bed without using bedrails?: A Little Help needed moving to and from a bed to a chair (including a wheelchair)?: A Little Help needed standing up from a chair using your arms (e.g., wheelchair or bedside chair)?: A Little Help needed to walk in hospital room?: A Little Help needed climbing 3-5 steps with a railing? : A Lot 6 Click Score: 18    End of Session Equipment Utilized During Treatment: Gait belt;Back brace Activity Tolerance: Patient limited by pain Patient left: in chair;with call bell/phone within reach Nurse Communication: Mobility status PT Visit Diagnosis: Unsteadiness on feet (R26.81);Muscle weakness (generalized) (M62.81);Difficulty in walking, not elsewhere classified (R26.2);Pain Pain - part of body:  (back)     Time: 4818-5631 PT Time Calculation (min) (ACUTE ONLY): 16 min  Charges:  $Gait Training: 8-22 mins                     [name], PT, DPT Acute Rehabilitation Services Pager: [phone] Office: [phone]    [name] 02/12/2021, 12:46 PM

## 2021-02-12 NOTE — TOC Initial Note (Signed)
Transition of Care [facility]) - Initial/Assessment Note    Patient Details  Name: [name] MRN: [id number] Date of Birth: 1961-05-24  [facility] CM/SW Contact:    Angelita Ingles, RN Phone Number: [phone]  02/12/2021, 9:41 AM  Clinical Narrative:                 [facility] consulted for [facility] services. CM is unable to find agency to accept patient for [facility] services. Patient states that she is not able to go for outpatient therapy.Patient states that she has her spouse and a friend that are able to help her and that she will be able to manage at home. Message has been sent to primary nurse to make her aware. No other needs noted at this time. TOC will sign off.    Expected Discharge Plan: [place] Barriers to Discharge: No [facility] will accept this patient   Patient Goals and CMS Choice Patient states their goals for this hospitalization and ongoing recovery are:: Just wants to get better and go home CMS Medicare.gov Compare Post Acute Care list provided to::  (n/a) Choice offered to / list presented to :  (n/a)  Expected Discharge Plan and Services Expected Discharge Plan: [place] In-house Referral: NA Discharge Planning Services: NA Post Acute Care Choice: NA Living arrangements for the past 2 months: Single Family Home                 DME Arranged: N/A DME Agency: NA       HH Arranged: NA HH Agency: NA        Prior Living Arrangements/Services Living arrangements for the past 2 months: Single Family Home Lives with:: Spouse Patient language and need for interpreter reviewed:: Yes Do you feel safe going back to the place where you live?: Yes      Need for Family Participation in Patient Care: Yes (Comment) Care giver support system in place?: Yes (comment)   Criminal Activity/Legal Involvement Pertinent to Current Situation/Hospitalization: No - Comment as needed  Activities of Daily Living Home Assistive  Devices/Equipment: Cane (specify quad or straight),CBG Meter,Blood pressure cuff,Oxygen,Grab bars in shower,Grab bars around toilet,Hand-held shower hose,Shower chair with back,Dentures (specify type) (full plate) ADL Screening (condition at time of admission) Patient's cognitive ability adequate to safely complete daily activities?: Yes Is the patient deaf or have difficulty hearing?: No Does the patient have difficulty seeing, even when wearing glasses/contacts?: No Does the patient have difficulty concentrating, remembering, or making decisions?: No Patient able to express need for assistance with ADLs?: Yes Does the patient have difficulty dressing or bathing?: Yes Independently performs ADLs?: No Communication: Independent Dressing (OT): Needs assistance Is this a change from baseline?: Change from baseline, expected to last <3days Grooming: Independent Feeding: Independent Bathing: Needs assistance Is this a change from baseline?: Change from baseline, expected to last <3 days Toileting: Needs assistance Is this a change from baseline?: Change from baseline, expected to last <3 days In/Out Bed: Needs assistance Is this a change from baseline?: Change from baseline, expected to last <3 days Does the patient have difficulty walking or climbing stairs?: Yes (pain and leg weakness) Weakness of Legs: Both Weakness of Arms/Hands: Both  Permission Sought/Granted   Permission granted to share information with : No              Emotional Assessment Appearance:: Other (Comment Required (phone assessment) Attitude/Demeanor/Rapport: Gracious Affect (typically observed): Unable to Assess  Orientation: : Oriented to Self,Oriented to Place,Oriented to  Time,Oriented to Situation Alcohol / Substance Use: Not Applicable Psych Involvement: No (comment)  Admission diagnosis:  Status post lumbar spinal fusion [Z98.1] Patient Active Problem List   Diagnosis Date Noted  . Spondylolisthesis,  lumbar region 02/11/2021    Class: Acute  . Status post lumbar spinal fusion 02/11/2021  . URI with cough and congestion 12/04/2020  . Neuropathy of forearm, right 09/25/2020  . Shoulder pain, left 09/25/2020  . Tick bite 04/06/2020  . Stiffness of hand joint 06/09/2019  . Right arm numbness 06/09/2019  . Low back pain 05/02/2019  . Pain of left hip joint 08/09/2018  . Malignant neoplasm of upper-outer quadrant of right breast in female, estrogen receptor positive ([place]) 06/10/2018  . Screening for colon cancer 03/12/2018  . Screening for breast cancer 03/12/2018  . Restless leg syndrome 02/04/2018  . 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 Depression 08/06/2015  . Hypertension associated with diabetes ([place])   . Aortic stenosis   . Chronic diastolic congestive heart failure ([place])   . COPD (chronic obstructive pulmonary disease) ([place]) 09/21/2013  . Obstructive sleep apnea 09/21/2013  . Nocturnal hypoxemia 09/12/2013  . Abnormal CT scan, chest 05/12/2013  . Hyperlipidemia 11/30/2012  . History of pulmonary embolus (PE) 07/09/2012  . Normocytic anemia 02/08/2012  . Type 2 diabetes mellitus without complication, without long-term current use of insulin ([place]) 02/08/2012  . Asthma 02/08/2012  . GERD (gastroesophageal reflux disease)   . CAD (coronary artery disease) 12/08/2011   PCP:  [name], MD Pharmacy:   [facility], [address] [place] [address] Phone: [phone] Fax: [phone]     Social Determinants of Health (SDOH) Interventions    Readmission Risk Interventions No flowsheet data found.

## 2021-02-13 DIAGNOSIS — Z7982 Long term (current) use of aspirin: Secondary | ICD-10-CM | POA: Diagnosis not present

## 2021-02-13 DIAGNOSIS — E119 Type 2 diabetes mellitus without complications: Secondary | ICD-10-CM | POA: Diagnosis not present

## 2021-02-13 DIAGNOSIS — Z86711 Personal history of pulmonary embolism: Secondary | ICD-10-CM | POA: Diagnosis not present

## 2021-02-13 DIAGNOSIS — Z7902 Long term (current) use of antithrombotics/antiplatelets: Secondary | ICD-10-CM | POA: Diagnosis not present

## 2021-02-13 DIAGNOSIS — M48062 Spinal stenosis, lumbar region with neurogenic claudication: Secondary | ICD-10-CM | POA: Diagnosis not present

## 2021-02-13 DIAGNOSIS — Z9981 Dependence on supplemental oxygen: Secondary | ICD-10-CM | POA: Diagnosis not present

## 2021-02-13 DIAGNOSIS — G2581 Restless legs syndrome: Secondary | ICD-10-CM | POA: Diagnosis not present

## 2021-02-13 DIAGNOSIS — Z6841 Body Mass Index (BMI) 40.0 and over, adult: Secondary | ICD-10-CM | POA: Diagnosis not present

## 2021-02-13 DIAGNOSIS — I5032 Chronic diastolic (congestive) heart failure: Secondary | ICD-10-CM | POA: Diagnosis not present

## 2021-02-13 DIAGNOSIS — I2510 Atherosclerotic heart disease of native coronary artery without angina pectoris: Secondary | ICD-10-CM | POA: Diagnosis present

## 2021-02-13 DIAGNOSIS — E785 Hyperlipidemia, unspecified: Secondary | ICD-10-CM | POA: Diagnosis not present

## 2021-02-13 DIAGNOSIS — F32A Depression, unspecified: Secondary | ICD-10-CM | POA: Diagnosis not present

## 2021-02-13 DIAGNOSIS — Z7951 Long term (current) use of inhaled steroids: Secondary | ICD-10-CM | POA: Diagnosis not present

## 2021-02-13 DIAGNOSIS — I110 Hypertensive heart disease with heart failure: Secondary | ICD-10-CM | POA: Diagnosis not present

## 2021-02-13 DIAGNOSIS — D259 Leiomyoma of uterus, unspecified: Secondary | ICD-10-CM | POA: Diagnosis present

## 2021-02-13 DIAGNOSIS — M4316 Spondylolisthesis, lumbar region: Secondary | ICD-10-CM | POA: Diagnosis not present

## 2021-02-13 DIAGNOSIS — J449 Chronic obstructive pulmonary disease, unspecified: Secondary | ICD-10-CM | POA: Diagnosis not present

## 2021-02-13 DIAGNOSIS — Z79899 Other long term (current) drug therapy: Secondary | ICD-10-CM | POA: Diagnosis not present

## 2021-02-13 DIAGNOSIS — L309 Dermatitis, unspecified: Secondary | ICD-10-CM | POA: Diagnosis present

## 2021-02-13 DIAGNOSIS — I252 Old myocardial infarction: Secondary | ICD-10-CM | POA: Diagnosis not present

## 2021-02-13 DIAGNOSIS — Z96653 Presence of artificial knee joint, bilateral: Secondary | ICD-10-CM | POA: Diagnosis present

## 2021-02-13 DIAGNOSIS — G4730 Sleep apnea, unspecified: Secondary | ICD-10-CM | POA: Diagnosis present

## 2021-02-13 DIAGNOSIS — D62 Acute posthemorrhagic anemia: Secondary | ICD-10-CM | POA: Diagnosis not present

## 2021-02-13 LAB — BASIC METABOLIC PANEL
Anion gap: 7 (ref 5–15)
BUN: 24 mg/dL — ABNORMAL HIGH (ref 8–23)
CO2: 27 mmol/L (ref 22–32)
Calcium: 8.7 mg/dL — ABNORMAL LOW (ref 8.9–10.3)
Chloride: 101 mmol/L (ref 98–111)
Creatinine, Ser: 0.98 mg/dL (ref 0.44–1.00)
GFR, Estimated: >60 mL/min (ref >60)
Glucose, Bld: 255 mg/dL — ABNORMAL HIGH (ref 70–99)
Potassium: 3.8 mmol/L (ref 3.5–5.1)
Sodium: 135 mmol/L (ref 135–145)

## 2021-02-13 LAB — CBC WITH DIFFERENTIAL/PLATELET
Abs Immature Granulocytes: 0.08 10*3/uL — ABNORMAL HIGH (ref 0.00–0.07)
Basophils Absolute: 0 10*3/uL (ref 0.0–0.1)
Basophils Relative: 0 %
Eosinophils Absolute: 0.5 10*3/uL (ref 0.0–0.5)
Eosinophils Relative: 5 %
HCT: 30.5 % — ABNORMAL LOW (ref 36.0–46.0)
Hemoglobin: 9.9 g/dL — ABNORMAL LOW (ref 12.0–15.0)
Immature Granulocytes: 1 %
Lymphocytes Relative: 13 %
Lymphs Abs: 1.5 10*3/uL (ref 0.7–4.0)
MCH: 29.3 pg (ref 26.0–34.0)
MCHC: 32.5 g/dL (ref 30.0–36.0)
MCV: 90.2 fL (ref 80.0–100.0)
Monocytes Absolute: 1.1 10*3/uL — ABNORMAL HIGH (ref 0.1–1.0)
Monocytes Relative: 10 %
Neutro Abs: 8.2 10*3/uL — ABNORMAL HIGH (ref 1.7–7.7)
Neutrophils Relative %: 71 %
Platelets: 147 10*3/uL — ABNORMAL LOW (ref 150–400)
RBC: 3.38 MIL/uL — ABNORMAL LOW (ref 3.87–5.11)
RDW: 13 % (ref 11.5–15.5)
WBC: 11.4 10*3/uL — ABNORMAL HIGH (ref 4.0–10.5)
nRBC: 0 % (ref 0.0–0.2)

## 2021-02-13 LAB — GLUCOSE, CAPILLARY: Glucose-Capillary: 216 mg/dL — ABNORMAL HIGH (ref 70–99)

## 2021-02-13 MED ORDER — HYDROCODONE-ACETAMINOPHEN 7.5-325 MG PO TABS: 2.0000 | ORAL_TABLET | ORAL | 0 refills | Status: DC | PRN

## 2021-02-13 MED ORDER — DOCUSATE SODIUM 100 MG PO CAPS: 100.0000 mg | ORAL_CAPSULE | Freq: Two times a day (BID) | ORAL | 0 refills | Status: DC

## 2021-02-13 MED ORDER — FERROUS GLUCONATE 324 (38 FE) MG PO TABS: 324.0000 mg | ORAL_TABLET | Freq: Two times a day (BID) | ORAL | 1 refills | Status: DC

## 2021-02-13 NOTE — Plan of Care (Signed)
Pt given D/C instructions with verbal understanding. Rx's were sent to the pharmacy by MD. Pt's incision is clean and dry with no sign of infection. Pt's IV was removed prior to D/C. Pt D/C'd home via wheelchair per MD order. Pt is stable @ D/C and has no other needs at this time. Pt received RW and 3-n-1 from [employer] per MD order. Holli Humbles, RN

## 2021-02-13 NOTE — Progress Notes (Signed)
     Subjective: 2 Days Post-Op Procedure(s) (LRB): L4-5 LEFT POSTERIOR INTERBODY FUSION WITH DEPUY MPACT PEDICLE SCREWS, RODS AND CAGES, LOCAL AND ALLOGRAFT BONE GRAFT, VIVIGEN (N/A) Awake, alert and sitting at bedside recliner. Cloths are packed.  Patient reports pain as moderate.    Objective:   VITALS:  Temp:  [98.1 F (36.7 C)-99 F (37.2 C)] 98.4 F (36.9 C) (02/16 0734) Pulse Rate:  [80-105] 89 (02/16 0734) Resp:  [17-20] 17 (02/16 0734) BP: (102-114)/(49-58) 102/49 (02/16 0734) SpO2:  [92 %-94 %] 93 % (02/16 0734)  Neurologically intact ABD soft Neurovascular intact Sensation intact distally Intact pulses distally Dorsiflexion/Plantar flexion intact Incision: scant drainage No cellulitis present Compartment soft   LABS Recent Labs    02/11/21 0609 02/12/21 0415 02/13/21 0517  HGB 13.8 10.9* 9.9*  WBC 7.3 8.9 11.4*  PLT 191 146* 147*   Recent Labs    02/12/21 0415 02/13/21 0517  NA 137 135  K 4.0 3.8  CL 101 101  CO2 28 27  BUN 17 24*  CREATININE 0.81 0.98  GLUCOSE 257* 255*   No results for input(s): LABPT, INR in the last 72 hours.   Assessment/Plan: 2 Days Post-Op Procedure(s) (LRB): L4-5 LEFT POSTERIOR INTERBODY FUSION WITH DEPUY MPACT PEDICLE SCREWS, RODS AND CAGES, LOCAL AND ALLOGRAFT BONE GRAFT, VIVIGEN (N/A) Anemia due to blood loss Diabetes Mellitus Type 2 Advance diet Up with therapy Discharge home with home health  Following PT and OT today  [name] 02/13/2021, 9:10 AMPatient ID: [name], female   DOB: March 25, 1961, 60 y.o.   MRN: [id number]

## 2021-02-13 NOTE — Progress Notes (Signed)
Occupational Therapy Treatment Patient Details Name: [name] MRN: [id number] DOB: 1961-06-22 Today's Date: 02/13/2021    History of present illness Pt is a 60 y.o. F with significant PMH of CAD, COPD, depression, diabetes mellitus, PE, bilateral TKA's who presents s/p L L4-5 PLIF.   OT comments  OT treatment session with focus on self-care re-education, activity tolerance, and safety with DME. Patient making progress toward goals noting increased adherence to back precautions this date requiring Min guard to Min A overall for completion of morning ADLs with use of RW and increased time. Patient unable to stand for 3/3 grooming tasks at sink surface 2/2 fatigue and decreased activity tolerance noting ability to do so PTA. Patient requiring at least unilateral UE support on sink surface this date 2/2 decreased standing balance. Cues for compensatory technique for adherence to back precautions during oral hygiene. Patient would benefit from continued acute OT services in prep for safe d/c home. Recommendation for HHOT continues to be appropriate.    Follow Up Recommendations  Home health OT;Supervision - Intermittent    Equipment Recommendations  3 in 1 bedside commode    Recommendations for Other Services      Precautions / Restrictions Precautions Precautions: Back;Fall Precaution Booklet Issued: No Precaution Comments: Verbally reviewed. Patient able to recal 3/3 back precautions at start of session. Min cues for adherence during ADLs. Required Braces or Orthoses: Spinal Brace Spinal Brace: Lumbar corset Restrictions Weight Bearing Restrictions: No       Mobility Bed Mobility Overal bed mobility: Needs Assistance Bed Mobility: Rolling;Sidelying to Sit;Sit to Sidelying Rolling: Supervision Sidelying to sit: Min assist       General bed mobility comments: HOB flat. Good recall of log rolling technique. Patient able to elevated trunk with increased time and Min A.  Heavy use of bed rail.  Transfers Overall transfer level: Needs assistance Equipment used: Rolling walker (2 wheeled) Transfers: Sit to/from Stand Sit to Stand: Min guard;Min assist         General transfer comment: Min guard to Min A for sit to stand from EOB and low recliner. Cues for hand placement and power negotiation.    Balance Overall balance assessment: Needs assistance Sitting-balance support: Feet supported Sitting balance-Leahy Scale: Fair     Standing balance support: Bilateral upper extremity supported Standing balance-Leahy Scale: Poor Standing balance comment: Reliant on BUE on RW and sink surface during grooming tasks.                           ADL either performed or assessed with clinical judgement   ADL Overall ADL's : Needs assistance/impaired     Grooming: Minimal assistance;Standing Grooming Details (indicate cue type and reason): Cues for upright posture at sink surface and for adherence to back precautions. Patient only able to stand for 1/3 grooming tasks 2/2 decreased activity tolerance and SOB.                 Toilet Transfer: Designer, television/film set Details (indicate cue type and reason): Simulated with transfer to recliner using RW. Cues for hand placement.         Functional mobility during ADLs: Min guard;Rolling walker General ADL Comments: Patient limited by decreased activity tolerance, decreased STM, and need for frequent seated rest breaks.     Vision       Perception     Praxis      Cognition Arousal/Alertness: Awake/alert Behavior During Therapy: [facility]  for tasks assessed/performed Overall Cognitive Status: Within Functional Limits for tasks assessed                                          Exercises     Shoulder Instructions       General Comments SOB with light activity. Patient demonstrates decreaed activity tolerance noting ability to stand for 3/3 grooming tasks PTA. Patient  only able to stand for 1/3 tasks this date with heavy reliance on at least unilateral UE support on RW.    Pertinent Vitals/ Pain       Pain Assessment: 0-10 Pain Score: 3  Pain Location: surgical site Pain Descriptors / Indicators: Operative site guarding;Grimacing Pain Intervention(s): Limited activity within patient's tolerance;Monitored during session;Repositioned  Home Living                                          Prior Functioning/Environment              Frequency  Min 2X/week        Progress Toward Goals  OT Goals(current goals can now be found in the care plan section)  Progress towards OT goals: Progressing toward goals  Acute Rehab OT Goals Patient Stated Goal: To return home. OT Goal Formulation: With patient Time For Goal Achievement: 02/26/21 Potential to Achieve Goals: Good ADL Goals Pt Will Perform Lower Body Dressing: with supervision;sit to/from stand;with adaptive equipment Pt Will Transfer to Toilet: with supervision;ambulating;bedside commode Pt Will Perform Toileting - Clothing Manipulation and hygiene: with supervision;with adaptive equipment;sit to/from stand Additional ADL Goal #1: Patient will recall 3/3 back precautions without cueing in prep for ADLs.  Plan Discharge plan remains appropriate;Frequency remains appropriate    Co-evaluation                 AM-PAC OT "6 Clicks" Daily Activity     Outcome Measure   Help from another person eating meals?: None Help from another person taking care of personal grooming?: A Little Help from another person toileting, which includes using toliet, bedpan, or urinal?: A Little Help from another person bathing (including washing, rinsing, drying)?: A Lot Help from another person to put on and taking off regular upper body clothing?: A Little Help from another person to put on and taking off regular lower body clothing?: A Little 6 Click Score: 18    End of Session  Equipment Utilized During Treatment: Rolling walker;Back brace  OT Visit Diagnosis: Unsteadiness on feet (R26.81);Other abnormalities of gait and mobility (R26.89);Muscle weakness (generalized) (M62.81)   Activity Tolerance Patient tolerated treatment well;Patient limited by fatigue   Patient Left with call bell/phone within reach;in chair   Nurse Communication Mobility status        Time: 5284-1324 OT Time Calculation (min): 21 min  Charges: OT General Charges $OT Visit: 1 Visit OT Treatments $Self Care/Home Management : 8-22 mins  Camelle Henkels H. OTR/L Supplemental OT, Department of rehab services [phone]   Bodie Abernethy R H. 02/13/2021, 9:31 AM

## 2021-02-13 NOTE — Progress Notes (Signed)
Physical Therapy Treatment Patient Details Name: [name] MRN: [id number] DOB: 10-09-1961 Today's Date: 02/13/2021    History of Present Illness Pt is a 60 y.o. F with significant PMH of CAD, COPD, depression, diabetes mellitus, PE, bilateral TKA's who presents s/p L L4-5 PLIF.    PT Comments    Pt progressing well with post-op mobility. She was able to demonstrate transfers and ambulation with gross min guard assist and RW for support. Overall pt was able to tolerate significantly increased activity, and reports feeling more comfortable this session as well. Continue to recommend pt utilize wheelchair and ramp for entry into house upon initial return home. Pt was educated on precautions, brace application/wearing schedule, appropriate activity progression, and car transfer. Will continue to follow.      Follow Up Recommendations  Home health PT;Supervision/Assistance - 24 hour     Equipment Recommendations  Rolling walker with 5" wheels;3in1 (PT) (bariatric)    Recommendations for Other Services       Precautions / Restrictions Precautions Precautions: Back;Fall Precaution Booklet Issued: No Precaution Comments: Verbally reviewed. Patient able to recal 3/3 back precautions at start of session. Min cues for adherence during ADLs. Required Braces or Orthoses: Spinal Brace Spinal Brace: Lumbar corset Restrictions Weight Bearing Restrictions: No    Mobility  Bed Mobility               General bed mobility comments: Pt was received sitting up in the recliner.    Transfers Overall transfer level: Needs assistance Equipment used: Rolling walker (2 wheeled) Transfers: Sit to/from Stand Sit to Stand: Min guard         General transfer comment: Increased time and momentum for power-up to full stand.  Ambulation/Gait Ambulation/Gait assistance: Min guard Gait Distance (Feet): 200 Feet Assistive device: Rolling walker (2 wheeled) Gait Pattern/deviations:  Step-through pattern;Decreased stride length;Trunk flexed Gait velocity: decreased Gait velocity interpretation: <1.31 ft/sec, indicative of household ambulator General Gait Details: Several standing rest break throughout 2 fatigue. Pt mildly dyspneic.   Stairs             Wheelchair Mobility    Modified Rankin (Stroke Patients Only)       Balance Overall balance assessment: Needs assistance Sitting-balance support: Feet supported Sitting balance-Leahy Scale: Fair     Standing balance support: Bilateral upper extremity supported Standing balance-Leahy Scale: Poor Standing balance comment: Reliant on BUE on RW and sink surface during grooming tasks.                            Cognition Arousal/Alertness: Awake/alert Behavior During Therapy: WFL for tasks assessed/performed Overall Cognitive Status: Within Functional Limits for tasks assessed                                        Exercises      General Comments        Pertinent Vitals/Pain Pain Assessment: Faces Faces Pain Scale: Hurts little more Pain Location: surgical site Pain Descriptors / Indicators: Operative site guarding;Grimacing Pain Intervention(s): Limited activity within patient's tolerance;Monitored during session;Repositioned    Home Living                      Prior Function            PT Goals (current goals can now be found in the care  plan section) Acute Rehab PT Goals Patient Stated Goal: To return home. PT Goal Formulation: With patient Time For Goal Achievement: 02/25/21 Potential to Achieve Goals: Good Progress towards PT goals: Progressing toward goals    Frequency    Min 5X/week      PT Plan Current plan remains appropriate    Co-evaluation              AM-PAC PT "6 Clicks" Mobility   Outcome Measure  Help needed turning from your back to your side while in a flat bed without using bedrails?: None Help needed moving  from lying on your back to sitting on the side of a flat bed without using bedrails?: A Little Help needed moving to and from a bed to a chair (including a wheelchair)?: A Little Help needed standing up from a chair using your arms (e.g., wheelchair or bedside chair)?: A Little Help needed to walk in hospital room?: A Little Help needed climbing 3-5 steps with a railing? : A Lot 6 Click Score: 18    End of Session Equipment Utilized During Treatment: Gait belt;Back brace Activity Tolerance: Patient limited by pain Patient left: in chair;with call bell/phone within reach Nurse Communication: Mobility status PT Visit Diagnosis: Unsteadiness on feet (R26.81);Muscle weakness (generalized) (M62.81);Difficulty in walking, not elsewhere classified (R26.2);Pain Pain - part of body:  (back)     Time: 3832-9191 PT Time Calculation (min) (ACUTE ONLY): 20 min  Charges:  $Gait Training: 8-22 mins                     Rolinda Roan, PT, DPT Acute Rehabilitation Services Pager: [phone] Office: [phone]    Thelma Comp 02/13/2021, 1:08 PM

## 2021-02-14 MED FILL — Heparin Sodium (Porcine) Inj 1000 Unit/ML: INTRAMUSCULAR | Qty: 30 | Status: AC

## 2021-02-14 MED FILL — Sodium Chloride IV Soln 0.9%: INTRAVENOUS | Qty: 1000 | Status: AC

## 2021-02-15 ENCOUNTER — Telehealth: Payer: Self-pay | Admitting: Specialist

## 2021-02-15 NOTE — Telephone Encounter (Signed)
Husband came in the office and we gave him Mepilex

## 2021-02-15 NOTE — Telephone Encounter (Signed)
Patient called. She would like to know if she can come and get some bandages? Her call back number is [phone]

## 2021-02-15 NOTE — Telephone Encounter (Signed)
Katlyn from [facility] called. She would like a Rx sent in for patient to get a grabber and also for patient to get in home services. Her call back number is [phone]

## 2021-02-15 NOTE — Telephone Encounter (Signed)
Holding for Christy. 

## 2021-02-15 NOTE — Telephone Encounter (Signed)
Can you call her and see what type of bandages she is wanting? [name] not back in office until Tuesday

## 2021-02-15 NOTE — Telephone Encounter (Signed)
Called patient no answer LMOM. What type of bandages does she need?  Also sent her a my chart msg

## 2021-02-18 ENCOUNTER — Other Ambulatory Visit: Payer: Self-pay | Admitting: Specialist

## 2021-02-18 ENCOUNTER — Encounter (HOSPITAL_COMMUNITY): Payer: Self-pay | Admitting: Specialist

## 2021-02-18 ENCOUNTER — Other Ambulatory Visit: Payer: Self-pay | Admitting: Internal Medicine

## 2021-02-18 ENCOUNTER — Telehealth: Payer: Self-pay | Admitting: Specialist

## 2021-02-18 DIAGNOSIS — E119 Type 2 diabetes mellitus without complications: Secondary | ICD-10-CM

## 2021-02-18 MED ORDER — HYDROCODONE-ACETAMINOPHEN 7.5-325 MG PO TABS: 2.0000 | ORAL_TABLET | ORAL | 0 refills | Status: DC | PRN

## 2021-02-18 NOTE — Transfer of Care (Signed)
Immediate Anesthesia Transfer of Care Note  Patient: [name]  Procedure(s) Performed: L4-5 LEFT POSTERIOR INTERBODY FUSION WITH DEPUY MPACT PEDICLE SCREWS, RODS AND CAGES, LOCAL AND ALLOGRAFT BONE GRAFT, VIVIGEN (N/A )  Patient Location: PACU  Anesthesia Type:General  Level of Consciousness: awake, alert  and oriented  Airway & Oxygen Therapy: Patient Spontanous Breathing and Patient connected to face mask oxygen  Post-op Assessment: Report given to RN and Post -op Vital signs reviewed and stable  Post vital signs: Reviewed and stable  Last Vitals:  Vitals Value Taken Time  BP 102/49 02/13/21 0734  Temp 36.9 C 02/13/21 0734  Pulse 89 02/13/21 0734  Resp 17 02/13/21 0734  SpO2 93 % 02/13/21 0734    Last Pain:  Vitals:   02/13/21 1000  TempSrc:   PainSc: 3       Patients Stated Pain Goal: 3 (16/10/96 0454)  Complications: No complications documented.

## 2021-02-18 NOTE — Telephone Encounter (Signed)
I think that I sent Dr. Louanne Skye a rx refill request on this earlier this A.M.  Did not want to send back to him. Holding for him to answer.

## 2021-02-18 NOTE — Telephone Encounter (Signed)
Pt called asking for a refill of her norco, she would like a CB when it's been sent in.   [phone]

## 2021-02-18 NOTE — Telephone Encounter (Signed)
Patient called requesting a refill of hydrocodone. Please send to pharmacy on file. Patient phone number is [phone].

## 2021-02-18 NOTE — Telephone Encounter (Signed)
Please advise 

## 2021-02-19 NOTE — Telephone Encounter (Signed)
Medication sent to pharmacy. I called patient and advised.  She states that she went to pick up the prescription and that the pharmacy told her that something was wrong with the prescription and that they were sending fax to our office for changes. Please call her once paperwork has been completed and she can pick up her medication.   (I was not sure if you have received fax)

## 2021-02-19 NOTE — Telephone Encounter (Signed)
Printed for Dr. Nitka to review. 

## 2021-02-19 NOTE — Telephone Encounter (Signed)
Nothing has been rec'd yet from the pharmacy.

## 2021-02-21 ENCOUNTER — Other Ambulatory Visit: Payer: Self-pay | Admitting: Internal Medicine

## 2021-02-21 ENCOUNTER — Other Ambulatory Visit: Payer: Self-pay | Admitting: Specialist

## 2021-02-21 DIAGNOSIS — J441 Chronic obstructive pulmonary disease with (acute) exacerbation: Secondary | ICD-10-CM

## 2021-02-21 MED ORDER — TRELEGY ELLIPTA 100-62.5-25 MCG/INH IN AEPB: 1.0000 | INHALATION_SPRAY | Freq: Every day | RESPIRATORY_TRACT | 2 refills | Status: DC

## 2021-02-21 MED ORDER — HYDROCODONE-ACETAMINOPHEN 7.5-325 MG PO TABS: 1.0000 | ORAL_TABLET | ORAL | 0 refills | Status: AC | PRN

## 2021-02-21 NOTE — Telephone Encounter (Signed)
I have given this info to Dr. Louanne Skye.

## 2021-02-21 NOTE — Addendum Note (Signed)
Addended by: Velora Heckler on: 02/21/2021 10:55 AM   Modules accepted: Orders

## 2021-02-21 NOTE — Telephone Encounter (Signed)
Need refill on PROAIR HFA 108 (90 Base) MCG/ACT inhaler  Fluticasone-Umeclidin-Vilant (TRELEGY ELLIPTA) 100-62.5-25 MCG/INH AEPB  ;pt contact [place], [place]

## 2021-02-21 NOTE — Telephone Encounter (Signed)
I called [facility] pharmacy and spoke with Claiborne Billings, she states that this was filled as a 4 day supply

## 2021-02-22 ENCOUNTER — Ambulatory Visit: Payer: Medicaid Other | Admitting: Cardiovascular Disease

## 2021-02-22 NOTE — Telephone Encounter (Signed)
Please advise 

## 2021-02-26 DIAGNOSIS — Z419 Encounter for procedure for purposes other than remedying health state, unspecified: Secondary | ICD-10-CM | POA: Diagnosis not present

## 2021-03-04 ENCOUNTER — Encounter: Payer: Self-pay | Admitting: Specialist

## 2021-03-04 ENCOUNTER — Ambulatory Visit (INDEPENDENT_AMBULATORY_CARE_PROVIDER_SITE_OTHER): Payer: Medicaid Other | Admitting: Specialist

## 2021-03-04 ENCOUNTER — Other Ambulatory Visit: Payer: Self-pay

## 2021-03-04 ENCOUNTER — Ambulatory Visit (INDEPENDENT_AMBULATORY_CARE_PROVIDER_SITE_OTHER): Payer: Medicaid Other

## 2021-03-04 VITALS — BP 172/101 | HR 90 | Ht 63.0 in | Wt 309.0 lb

## 2021-03-04 DIAGNOSIS — Z981 Arthrodesis status: Secondary | ICD-10-CM

## 2021-03-04 DIAGNOSIS — G5711 Meralgia paresthetica, right lower limb: Secondary | ICD-10-CM

## 2021-03-04 MED ORDER — HYDROCODONE-ACETAMINOPHEN 7.5-325 MG PO TABS: 1.0000 | ORAL_TABLET | Freq: Four times a day (QID) | ORAL | 0 refills | Status: DC | PRN

## 2021-03-04 NOTE — Progress Notes (Signed)
Post-Op Visit Note   Patient: [name]           Date of Birth: 1960/12/30           MRN: [id number] Visit Date: 03/04/2021 PCP: [name], MD   Assessment & Plan:3 weeks post op L4-5 lumbar fusion for spondylolisthesis.  Chief Complaint:  Chief Complaint  Patient presents with  . Lower Back - Routine Post Op  Pain in back when up for more than 2 hours.Some numbness and paresthesias right anterior thigh.  Motor both legs is normal. Staples midline lumbar spine are ready to be removed, erythrema about the staples all levels. Radiographs reviewed with patient.   Visit Diagnoses:  1. S/P lumbar spinal fusion     Plan:   Call if there is increasing drainage, fever greater than 101.5, severe head aches, and worsening nausea or light sensitivity. If shortness of breath, bloody cough or chest tightness or pain go to an emergency room. No lifting greater than 10 lbs. Avoid bending, stooping and twisting. Use brace when sitting and out of bed even to go to bathroom. Slowly increasing distances up to one half mile by 4-6 weeks post op. May shower and change dressing following bathing with shower.When bathing remove the brace shower and replace brace before getting out of the shower. If drainage, keep dry dressing and do not bathe the incision, use an moisture impervious dressing. Please call and return for scheduled follow up appointment 2 weeks from the time of surgery.    Follow-Up Instructions: No follow-ups on file.   Orders:  Orders Placed This Encounter  Procedures  . XR Lumbar Spine 2-3 Views   No orders of the defined types were placed in this encounter.   Imaging: No results found.  PMFS History: Patient Active Problem List   Diagnosis Date Noted  . Spondylolisthesis, lumbar region 02/11/2021    Priority: High    Class: Acute  . Status post lumbar spinal fusion 02/11/2021  . URI with cough and congestion 12/04/2020  . Neuropathy of  forearm, right 09/25/2020  . Shoulder pain, left 09/25/2020  . Tick bite 04/06/2020  . Stiffness of hand joint 06/09/2019  . Right arm numbness 06/09/2019  . Low back pain 05/02/2019  . Pain of left hip joint 08/09/2018  . Malignant neoplasm of upper-outer quadrant of right breast in female, estrogen receptor positive ([place]) 06/10/2018  . Screening for colon cancer 03/12/2018  . Screening for breast cancer 03/12/2018  . Restless leg syndrome 02/04/2018  . 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 Depression 08/06/2015  . Hypertension associated with diabetes ([place])   . Aortic stenosis   . Chronic diastolic congestive heart failure ([place])   . COPD (chronic obstructive pulmonary disease) ([place]) 09/21/2013  . Obstructive sleep apnea 09/21/2013  . Nocturnal hypoxemia 09/12/2013  . Abnormal CT scan, chest 05/12/2013  . Hyperlipidemia 11/30/2012  . History of pulmonary embolus (PE) 07/09/2012  . Normocytic anemia 02/08/2012  . Type 2 diabetes mellitus without complication, without long-term current use of insulin ([place]) 02/08/2012  . Asthma 02/08/2012  . GERD (gastroesophageal reflux disease)   . CAD (coronary artery disease) 12/08/2011   Past Medical History:  Diagnosis Date  . Anemia   . Aortic valve stenosis, severe   . 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 Asthma   . Blood  transfusion    [facility]  . Breast cancer in female [facility])    Right  . CAD (coronary artery disease)    Cath 2010 with DES x 1 RCA-- PT'S CARDIOLOGIST IS DR. Bledsoe  . Chronic diastolic congestive heart failure ([place])   . COPD (chronic obstructive pulmonary disease) ([place])   . Depression   . Diabetes mellitus DIAGNOSED IN2010   Controll s with diet  . Dyspnea    with much ambulation  . Eczema    on back  . Headache    migraines younger- rare now 02/07/21  . Heart murmur   . History of  hiatal hernia   . History of kidney stones    passed or blasted  . Hyperlipidemia   . Hypertension   . Morbid obesity with body mass index of 60.0-69.9 in adult [facility])   . Myocardial infarction ([place])    PT THINKS SHE WAS DX WITH MI AT THE TIME OF HEART STENTING  . Neuromuscular disorder ([place])    bilateral hands  . Pneumonia   . Pulmonary embolism ([place]) 02/08/12   S/P RT TOTAL KNEE ON 02/03/12--ON 02/08/12--DEVELOPED ACUTE SOB AND CHEST PAIN--AND DIAGNOSED WITH  PULMONARY EMBOLUS AND PNEUMONIA  . Restless leg syndrome   . Sleep apnea    uses 3 liters O2 at night   . Uterine fibroid    NO PROBLEMS AT PRESENT FROM THE FIBROIDS-STATES SHE IS POST MENOPAUSAL-LAST MENSES 2010 EXCEPT FOR EPISODE THIS YR OF BLEEDING RELATED TO FIBROIDS.  Marland Kitchen Weakness    BOTH HANDS - S/P BILATERAL CARPAL TUNNEL RELEASE--BUT STILL HAS WEAKNESS--OFTEN DROPS THINGS    Family History  Problem Relation Age of Onset  . Breast cancer Mother        stage IV at diagnosis  . Emphysema Mother        smoked  . Heart disease Mother   . COPD Father        smoked  . Asthma Father   . Heart disease Father   . Cancer Brother        Sinus    Past Surgical History:  Procedure Laterality Date  . BREAST LUMPECTOMY Right 06/2018  . BREAST LUMPECTOMY WITH RADIOACTIVE SEED AND SENTINEL LYMPH NODE BIOPSY Right 07/19/2018   Procedure: BREAST LUMPECTOMY WITH RADIOACTIVE SEED AND SENTINEL LYMPH NODE BIOPSY;  Surgeon: Alphonsa Overall, MD;  Location: [place];  Service: General;  Laterality: Right;  . CARDIAC CATHETERIZATION    . CARDIAC VALVE REPLACEMENT     2017  . CARPAL TUNNEL RELEASE     Bilateral  . CHOLECYSTECTOMY    . CORONARY ANGIOPLASTY     2010 has stent in place  . CYSTOSCOPY W/ RETROGRADES Right 09/21/2013   Procedure: CYSTOSCOPY WITH RIGHT RETROGRADE PYELOGRAM RIGHT DOUBLE J STENT ;  Surgeon: Fredricka Bonine, MD;  Location: [facility];  Service: Urology;  Laterality: Right;  . CYSTOSCOPY WITH URETEROSCOPY AND STENT  PLACEMENT Right 10/25/2013   Procedure: CYSTOSCOPY RIGHT URETEROSCOPY HOLMIUM LASER LITHO AND STENT PLACEMENT;  Surgeon: Fredricka Bonine, MD;  Location: [facility];  Service: Urology;  Laterality: Right;  . HERNIA REPAIR    . INTRAOPERATIVE TRANSESOPHAGEAL ECHOCARDIOGRAM N/A 12/12/2014   Procedure: INTRAOPERATIVE TRANSESOPHAGEAL ECHOCARDIOGRAM;  Surgeon: Burnell Blanks, MD;  [facility];  Service: Cardiovascular;  Laterality: N/A;  . JOINT REPLACEMENT     bil total knees  . KNEE ARTHROPLASTY  02/03/2012   Procedure: COMPUTER ASSISTED TOTAL KNEE ARTHROPLASTY;  Surgeon: Mcarthur Rossetti,  MD;  Location: [place];  Service: Orthopedics;  Laterality: Right;  Right total knee arthroplasty  . LEFT AND RIGHT HEART CATHETERIZATION WITH CORONARY ANGIOGRAM N/A 03/17/2013   Procedure: LEFT AND RIGHT HEART CATHETERIZATION WITH CORONARY ANGIOGRAM;  Surgeon: Burnell Blanks, MD;  Location: [facility] CATH LAB;  Service: Cardiovascular;  Laterality: N/A;  . LEFT AND RIGHT HEART CATHETERIZATION WITH CORONARY/GRAFT ANGIOGRAM N/A 09/14/2014   Procedure: LEFT AND RIGHT HEART CATHETERIZATION WITH Beatrix Fetters;  Surgeon: Burnell Blanks, MD;  Location: [facility] CATH LAB;  Service: Cardiovascular;  Laterality: N/A;  . TEE WITHOUT CARDIOVERSION N/A 03/14/2013   Procedure: TRANSESOPHAGEAL ECHOCARDIOGRAM (TEE);  Surgeon: Lelon Perla, MD;  Location: [facility] ENDOSCOPY;  Service: Cardiovascular;  Laterality: N/A;  . TEE WITHOUT CARDIOVERSION N/A 11/14/2014   Procedure: TRANSESOPHAGEAL ECHOCARDIOGRAM (TEE);  Surgeon: Lelon Perla, MD;  Location: [facility] ENDOSCOPY;  Service: Cardiovascular;  Laterality: N/A;  . TONSILLECTOMY     maybe as a child- does not know  . TOTAL KNEE ARTHROPLASTY  09/10/2012   Procedure: TOTAL KNEE ARTHROPLASTY;  Surgeon: Mcarthur Rossetti, MD;  [facility];  Service: Orthopedics;  Laterality: Left;  . TOTAL KNEE REVISION Right 07/15/2013   Procedure: REVISION  ARTHROPLASTY RIGHT KNEE;  Surgeon: Mcarthur Rossetti, MD;  Location[facility];  Service: Orthopedics;  Laterality: Right;  . TRANSCATHETER AORTIC VALVE REPLACEMENT, TRANSFEMORAL N/A 12/12/2014   Procedure: TRANSCATHETER AORTIC VALVE REPLACEMENT, TRANSFEMORAL;  Surgeon: Burnell Blanks, MD;  Location: [place];  Service: Cardiovascular;  Laterality: N/A;  . TRIGGER FINGER RELEASE  09/10/2012   Procedure: RELEASE TRIGGER FINGER/A-1 PULLEY;  Surgeon: Mcarthur Rossetti, MD;  Location[facility];  Service: Orthopedics;  Laterality: Right;  Right Ring Finger  . TUBAL LIGATION     Social History   Occupational History  . Occupation: Disabled  Tobacco Use  . Smoking status: Former Smoker    Packs/day: 1.50    Years: 30.00    Pack years: 45.00    Types: Cigarettes    Quit date: 12/29/2000    Years since quitting: 20.1  . Smokeless tobacco: Never Used  Vaping Use  . Vaping Use: Never used  Substance and Sexual Activity  . Alcohol use: Not Currently  . Drug use: Not Currently  . Sexual activity: Not Currently    Birth control/protection: Surgical

## 2021-03-04 NOTE — Discharge Summary (Signed)
Patient ID: [name] MRN: [id number] DOB/AGE: 03-28-1961 60 y.o.  Admit date: 02/11/2021 Discharge date: 02/13/2021 Admission Diagnoses:  Principal Problem:   Spondylolisthesis, lumbar region Active Problems:   Status post lumbar spinal fusion   Discharge Diagnoses:  Principal Problem:   Spondylolisthesis, lumbar region Active Problems:   Status post lumbar spinal fusion  status post Procedure(s): L4-5 LEFT POSTERIOR INTERBODY FUSION WITH DEPUY MPACT PEDICLE SCREWS, RODS AND CAGES, LOCAL AND ALLOGRAFT BONE GRAFT, VIVIGEN  Past Medical History:  Diagnosis Date   Anemia    Aortic valve stenosis, severe    Arthritis    PAIN AND SEVERE OA LEFT KNEE ; S/P RIGHT TKA ON 02/03/12; HAS LOWER BACK PAIN-UNABLE TO STAND MORE THAN 10 MIN; ARTHRITIS "ALL OVER"   Asthma    Blood transfusion    [facility]   Breast cancer in female [facility])    Right   CAD (coronary artery disease)    Cath 2010 with DES x 1 RCA-- PT'S CARDIOLOGIST IS DR. MCALHANY   Chronic diastolic congestive heart failure (HCC)    COPD (chronic obstructive pulmonary disease) ([place])    Depression    Diabetes mellitus DIAGNOSED IN2010   Controll s with diet   Dyspnea    with much ambulation   Eczema    on back   Headache    migraines younger- rare now 02/07/21   Heart murmur    History of hiatal hernia    History of kidney stones    passed or blasted   Hyperlipidemia    Hypertension    Morbid obesity with body mass index of 60.0-69.9 in adult [facility])    Myocardial infarction ([place])    PT THINKS SHE WAS DX WITH MI AT THE TIME OF HEART STENTING   Neuromuscular disorder ([place])    bilateral hands   Pneumonia    Pulmonary embolism ([place]) 02/08/12   S/P RT TOTAL KNEE ON 02/03/12--ON 02/08/12--DEVELOPED ACUTE SOB AND CHEST PAIN--AND DIAGNOSED WITH  PULMONARY EMBOLUS AND PNEUMONIA   Restless leg syndrome    Sleep apnea    uses 3 liters O2 at night    Uterine fibroid    NO PROBLEMS AT PRESENT  FROM THE FIBROIDS-STATES SHE IS POST MENOPAUSAL-LAST MENSES 2010 EXCEPT FOR EPISODE THIS YR OF BLEEDING RELATED TO FIBROIDS.   Weakness    BOTH HANDS - S/P BILATERAL CARPAL TUNNEL RELEASE--BUT STILL HAS WEAKNESS--OFTEN DROPS THINGS    Surgeries: Procedure(s): L4-5 LEFT POSTERIOR INTERBODY FUSION WITH DEPUY MPACT PEDICLE SCREWS, RODS AND CAGES, LOCAL AND ALLOGRAFT BONE GRAFT, VIVIGEN on 02/11/2021   Consultants:   Discharged Condition: Improved  Hospital Course: [name] is an 60 y.o. female who was admitted 02/11/2021 for operative treatment of Spondylolisthesis, lumbar region. Patient failed conservative treatments (please see the history and physical for the specifics) and had severe unremitting pain that affects sleep, daily activities and work/hobbies. After pre-op clearance, the patient was taken to the operating room on 02/11/2021 and underwent  Procedure(s): L4-5 LEFT POSTERIOR INTERBODY FUSION WITH DEPUY MPACT PEDICLE SCREWS, RODS AND CAGES, LOCAL AND ALLOGRAFT BONE GRAFT, VIVIGEN.    Patient was given perioperative antibiotics:  Anti-infectives (From admission, onward)   Start     Dose/Rate Route Frequency Ordered Stop   02/11/21 1600  ceFAZolin (ANCEF) IVPB 2g/100 mL premix        2 g 200 mL/hr over 30 Minutes Intravenous Every 8 hours 02/11/21 1413 02/11/21 2356   02/11/21 0700  ceFAZolin (ANCEF) 3 g  in dextrose 5 % 50 mL IVPB        3 g 100 mL/hr over 30 Minutes Intravenous On call to O.R. 02/08/21 1151 02/11/21 1450       Patient was given sequential compression devices and early ambulation to prevent DVT.   Patient benefited maximally from hospital stay and there were no complications. At the time of discharge, the patient was urinating/moving their bowels without difficulty, tolerating a regular diet, pain is controlled with oral pain medications and they have been cleared by PT/OT.   Recent vital signs: No data found.   Recent laboratory studies: No results  for input(s): WBC, HGB, HCT, PLT, NA, K, CL, CO2, BUN, CREATININE, GLUCOSE, INR, CALCIUM in the last 72 hours.  Invalid input(s): PT, 2   Discharge Medications:   Allergies as of 02/13/2021   No Known Allergies     Medication List    STOP taking these medications   HYDROcodone-acetaminophen 5-325 MG tablet Commonly known as: NORCO/VICODIN     TAKE these medications   Accu-Chek FastClix Lancets Misc Check blood sugar two times a day   Accu-Chek Guide w/Device Kit 1 each by Does not apply route 2 (two) times daily.   acetaminophen 500 MG tablet Commonly known as: TYLENOL Take 1,000 mg by mouth every 6 (six) hours as needed for mild pain.   amlodipine-atorvastatin 5-80 MG tablet Commonly known as: Caduet Take 1 tablet by mouth daily. What changed: when to take this   Aspirin Adult Low Strength 81 MG EC tablet Generic drug: aspirin TAKE 1 Tablet BY MOUTH ONCE DAILY What changed: how much to take   clopidogrel 75 MG tablet Commonly known as: PLAVIX Take 1 tablet (75 mg total) by mouth every morning.   docusate sodium 100 MG capsule Commonly known as: COLACE Take 1 capsule (100 mg total) by mouth 2 (two) times daily.   DULoxetine 60 MG capsule Commonly known as: Cymbalta Take 1 capsule (60 mg total) by mouth daily.   exemestane 25 MG tablet Commonly known as: AROMASIN TAKE 1 TABLET BY MOUTH ONCE DAILY AFTER BREAKFAST What changed: See the new instructions.   ferrous gluconate 324 MG tablet Commonly known as: FERGON Take 1 tablet (324 mg total) by mouth 2 (two) times daily with a meal.   furosemide 40 MG tablet Commonly known as: LASIX Take 1 tablet (40 mg total) by mouth as needed for fluid or edema. What changed: when to take this   glucose blood test strip Commonly known as: Accu-Chek Guide Check blood sugar 2 times per day   hydrocortisone cream 1 % Apply 1 application topically daily as needed for itching.   liraglutide 18 MG/3ML Sopn Commonly  known as: VICTOZA Inject 0.3 mLs (1.8 mg total) into the skin daily.   losartan-hydrochlorothiazide 100-25 MG tablet Commonly known as: Hyzaar Take 1 tablet by mouth daily.   Melatonin 5 MG Caps Take 5 mg by mouth at bedtime as needed (insomnia).   metoprolol succinate 50 MG 24 hr tablet Commonly known as: Toprol XL Take 1 tablet (50 mg total) by mouth daily. Take with or immediately following a meal.   nitroGLYCERIN 0.4 MG SL tablet Commonly known as: Nitrostat Place 1 tablet (0.4 mg total) under the tongue every 5 (five) minutes as needed for chest pain.   OXYGEN Inhale 3 L into the lungs as needed.   Pen Needles 32G X 4 MM Misc 1.8 mg by Does not apply route daily.   Repatha SureClick  140 MG/ML [place] Generic drug: Evolocumab Inject 1 pen into the skin every 14 (fourteen) days. What changed: how much to take   vitamin C 1000 MG tablet Take 1,000 mg by mouth daily.       Diagnostic Studies: DG Lumbar Spine Complete  Result Date: 02/11/2021 CLINICAL DATA:  L4-5 fusion EXAM: LUMBAR SPINE - COMPLETE 4+ VIEW; DG C-ARM 1-60 MIN COMPARISON:  08/22/2020 FINDINGS: Posterior fusion changes noted at L4-5. No visible complicating feature. IMPRESSION: Posterior fusion changes at L4-5.  No visible complicating feature. Electronically Signed   By: Rolm Baptise M.D.   On: 02/11/2021 12:25   DG C-Arm 1-60 Min  Result Date: 02/11/2021 CLINICAL DATA:  L4-5 fusion EXAM: LUMBAR SPINE - COMPLETE 4+ VIEW; DG C-ARM 1-60 MIN COMPARISON:  08/22/2020 FINDINGS: Posterior fusion changes noted at L4-5. No visible complicating feature. IMPRESSION: Posterior fusion changes at L4-5.  No visible complicating feature. Electronically Signed   By: Rolm Baptise M.D.   On: 02/11/2021 12:25    Discharge Instructions    Call MD / Call 911   Complete by: As directed    If you experience chest pain or shortness of breath, CALL 911 and be transported to the hospital emergency room.  If you develope a fever above  101 F, pus (white drainage) or increased drainage or redness at the wound, or calf pain, call your surgeon's office.   Constipation Prevention   Complete by: As directed    Drink plenty of fluids.  Prune juice may be helpful.  You may use a stool softener, such as Colace (over the counter) 100 mg twice a day.  Use MiraLax (over the counter) for constipation as needed.   Diet - low sodium heart healthy   Complete by: As directed    Discharge instructions   Complete by: As directed    Call if there is increasing drainage, fever greater than 101.5, severe head aches, and worsening nausea or light sensitivity. If shortness of breath, bloody cough or chest tightness or pain go to an emergency room. No lifting greater than 10 lbs. Avoid bending, stooping and twisting. Use brace when sitting and out of bed even to go to bathroom. Walk in house for first 2 weeks then may start to get out slowly increasing distances up to one mile by 4-6 weeks post op. After 5 days may shower and change dressing following bathing with shower.When bathing remove the brace shower and replace brace before getting out of the shower. If drainage, keep dry dressing and do not bathe the incision, use an moisture impervious dressing. Please call and return for scheduled follow up appointment 2 weeks from the time of surgery.   Increase activity slowly as tolerated   Complete by: As directed        Follow-up Information    [name], MD .   Specialty: Orthopedic Surgery Contact information: [place] [address] [phone]               Discharge Plan:  discharge to home  Disposition:     Signed: Benjiman Core 03/04/2021, 2:41 PM

## 2021-03-10 DIAGNOSIS — Z03818 Encounter for observation for suspected exposure to other biological agents ruled out: Secondary | ICD-10-CM | POA: Diagnosis not present

## 2021-03-10 DIAGNOSIS — Z20822 Contact with and (suspected) exposure to covid-19: Secondary | ICD-10-CM | POA: Diagnosis not present

## 2021-03-11 ENCOUNTER — Other Ambulatory Visit: Payer: Self-pay

## 2021-03-11 MED ORDER — HYDROCODONE-ACETAMINOPHEN 7.5-325 MG PO TABS: 1.0000 | ORAL_TABLET | Freq: Four times a day (QID) | ORAL | 0 refills | Status: DC | PRN

## 2021-03-11 NOTE — Telephone Encounter (Signed)
Pt called and would like a refill on her hydrocodone  

## 2021-03-14 DIAGNOSIS — Z20822 Contact with and (suspected) exposure to covid-19: Secondary | ICD-10-CM | POA: Diagnosis not present

## 2021-03-14 DIAGNOSIS — U071 COVID-19: Secondary | ICD-10-CM | POA: Diagnosis not present

## 2021-03-25 ENCOUNTER — Other Ambulatory Visit: Payer: Self-pay

## 2021-03-25 MED ORDER — PEN NEEDLES 32G X 4 MM MISC: 1.8000 mg | Freq: Every day | 2 refills | Status: DC

## 2021-03-25 NOTE — Telephone Encounter (Signed)
  Insulin Pen Needle (PEN NEEDLES) 32G X 4 MM MISC, REFILL REQUEST @ [facility], [address] Phone:  [phone]  Fax:  [phone]

## 2021-03-28 ENCOUNTER — Telehealth: Payer: Self-pay

## 2021-03-28 ENCOUNTER — Other Ambulatory Visit: Payer: Self-pay

## 2021-03-28 ENCOUNTER — Encounter: Payer: Self-pay | Admitting: Specialist

## 2021-03-28 ENCOUNTER — Ambulatory Visit (INDEPENDENT_AMBULATORY_CARE_PROVIDER_SITE_OTHER): Payer: Medicaid Other | Admitting: Specialist

## 2021-03-28 ENCOUNTER — Ambulatory Visit (INDEPENDENT_AMBULATORY_CARE_PROVIDER_SITE_OTHER): Payer: Medicaid Other

## 2021-03-28 VITALS — BP 117/79 | HR 101 | Ht 63.0 in | Wt 309.0 lb

## 2021-03-28 DIAGNOSIS — Z981 Arthrodesis status: Secondary | ICD-10-CM

## 2021-03-28 NOTE — Patient Instructions (Signed)
Call if there is increasing drainage, fever greater than 101.5, severe head aches, and worsening nausea or light sensitivity. If shortness of breath, bloody cough or chest tightness or pain go to an emergency room. No lifting greater than 10 lbs. Avoid bending, stooping and twisting. Use brace when sitting and out of bed even to go to bathroom. Walk  slowly increasing distances up to one mile by 4-6 weeks post op. May shower and wear clothing between skin and brace .When bathing remove the brace shower and replace brace before getting out of the shower.

## 2021-03-28 NOTE — Progress Notes (Signed)
Post-Op Visit Note   Patient: [name]           Date of Birth: 1961/07/04           MRN: [id number] Visit Date: 03/28/2021 PCP: [name], MD   Assessment & Plan:  Chief Complaint:  Chief Complaint  Patient presents with  . Lower Back - Follow-up   Visit Diagnoses:  1. S/P lumbar spinal fusion   Incision is healed Motor is normal bilateral Radiographs with TLIF L4-5 in good position and alignment.  Plan:    Call if there is increasing drainage, fever greater than 101.5, severe head aches, and worsening nausea or light sensitivity. If shortness of breath, bloody cough or chest tightness or pain go to an emergency room. No lifting greater than 10 lbs. Avoid bending, stooping and twisting. Use brace when sitting and out of bed even to go to bathroom. Walk  slowly increasing distances up to one mile by 4-6 weeks post op. May shower and wear clothing between skin and brace .When bathing remove the brace shower and replace brace before getting out of the shower.     Follow-Up Instructions: No follow-ups on file.   Orders:  Orders Placed This Encounter  Procedures  . XR Lumbar Spine 2-3 Views   No orders of the defined types were placed in this encounter.   Imaging: No results found.  PMFS History: Patient Active Problem List   Diagnosis Date Noted  . Spondylolisthesis, lumbar region 02/11/2021    Priority: High    Class: Acute  . Status post lumbar spinal fusion 02/11/2021  . URI with cough and congestion 12/04/2020  . Neuropathy of forearm, right 09/25/2020  . Shoulder pain, left 09/25/2020  . Tick bite 04/06/2020  . Stiffness of hand joint 06/09/2019  . Right arm numbness 06/09/2019  . Low back pain 05/02/2019  . Pain of left hip joint 08/09/2018  . Malignant neoplasm of upper-outer quadrant of right breast in female, estrogen receptor positive ([place]) 06/10/2018  . Screening for colon cancer 03/12/2018  . Screening for breast  cancer 03/12/2018  . Restless leg syndrome 02/04/2018  . 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 Depression 08/06/2015  . Hypertension associated with diabetes ([place])   . Aortic stenosis   . Chronic diastolic congestive heart failure ([place])   . COPD (chronic obstructive pulmonary disease) ([place]) 09/21/2013  . Obstructive sleep apnea 09/21/2013  . Nocturnal hypoxemia 09/12/2013  . Abnormal CT scan, chest 05/12/2013  . Hyperlipidemia 11/30/2012  . History of pulmonary embolus (PE) 07/09/2012  . Normocytic anemia 02/08/2012  . Type 2 diabetes mellitus without complication, without long-term current use of insulin ([place]) 02/08/2012  . Asthma 02/08/2012  . GERD (gastroesophageal reflux disease)   . CAD (coronary artery disease) 12/08/2011   Past Medical History:  Diagnosis Date  . Anemia   . Aortic valve stenosis, severe   . 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 Asthma   . Blood transfusion    [facility]  . Breast cancer in female [facility])    Right  . CAD (coronary artery disease)    Cath 2010 with DES x 1 RCA-- PT'S CARDIOLOGIST IS DR. [place]  . Chronic diastolic congestive heart failure ([place])   . COPD (chronic obstructive pulmonary disease) ([place])   . Depression   . Diabetes mellitus DIAGNOSED  [id number]   Controll s with diet  . Dyspnea    with much ambulation  . Eczema    on back  . Headache    migraines younger- rare now 02/07/21  . Heart murmur   . History of hiatal hernia   . History of kidney stones    passed or blasted  . Hyperlipidemia   . Hypertension   . Morbid obesity with body mass index of 60.0-69.9 in adult [facility])   . Myocardial infarction ([place])    PT THINKS SHE WAS DX WITH MI AT THE TIME OF HEART STENTING  . Neuromuscular disorder ([place])    bilateral hands  . Pneumonia   . Pulmonary embolism ([place]) 02/08/12   S/P RT TOTAL KNEE ON 02/03/12--ON  02/08/12--DEVELOPED ACUTE SOB AND CHEST PAIN--AND DIAGNOSED WITH  PULMONARY EMBOLUS AND PNEUMONIA  . Restless leg syndrome   . Sleep apnea    uses 3 liters O2 at night   . Uterine fibroid    NO PROBLEMS AT PRESENT FROM THE FIBROIDS-STATES SHE IS POST MENOPAUSAL-LAST MENSES 2010 EXCEPT FOR EPISODE THIS YR OF BLEEDING RELATED TO FIBROIDS.  Marland Kitchen Weakness    BOTH HANDS - S/P BILATERAL CARPAL TUNNEL RELEASE--BUT STILL HAS WEAKNESS--OFTEN DROPS THINGS    Family History  Problem Relation Age of Onset  . Breast cancer Mother        stage IV at diagnosis  . Emphysema Mother        smoked  . Heart disease Mother   . COPD Father        smoked  . Asthma Father   . Heart disease Father   . Cancer Brother        Sinus    Past Surgical History:  Procedure Laterality Date  . BREAST LUMPECTOMY Right 06/2018  . BREAST LUMPECTOMY WITH RADIOACTIVE SEED AND SENTINEL LYMPH NODE BIOPSY Right 07/19/2018   Procedure: BREAST LUMPECTOMY WITH RADIOACTIVE SEED AND SENTINEL LYMPH NODE BIOPSY;  Surgeon: Alphonsa Overall, MD;  Location: [facility];  Service: General;  Laterality: Right;  . CARDIAC CATHETERIZATION    . CARDIAC VALVE REPLACEMENT     2017  . CARPAL TUNNEL RELEASE     Bilateral  . CHOLECYSTECTOMY    . CORONARY ANGIOPLASTY     2010 has stent in place  . CYSTOSCOPY W/ RETROGRADES Right 09/21/2013   Procedure: CYSTOSCOPY WITH RIGHT RETROGRADE PYELOGRAM RIGHT DOUBLE J STENT ;  Surgeon: Fredricka Bonine, MD;  Location: [facility];  Service: Urology;  Laterality: Right;  . CYSTOSCOPY WITH URETEROSCOPY AND STENT PLACEMENT Right 10/25/2013   Procedure: CYSTOSCOPY RIGHT URETEROSCOPY HOLMIUM LASER LITHO AND STENT PLACEMENT;  Surgeon: Fredricka Bonine, MD;  Location: [facility];  Service: Urology;  Laterality: Right;  . HERNIA REPAIR    . INTRAOPERATIVE TRANSESOPHAGEAL ECHOCARDIOGRAM N/A 12/12/2014   Procedure: INTRAOPERATIVE TRANSESOPHAGEAL ECHOCARDIOGRAM;  Surgeon: Burnell Blanks, MD;  Location[facility];   Service: Cardiovascular;  Laterality: N/A;  . JOINT REPLACEMENT     bil total knees  . KNEE ARTHROPLASTY  02/03/2012   Procedure: COMPUTER ASSISTED TOTAL KNEE ARTHROPLASTY;  Surgeon: Mcarthur Rossetti, MD;  Location: [facility];  Service: Orthopedics;  Laterality: Right;  Right total knee arthroplasty  . LEFT AND RIGHT HEART CATHETERIZATION WITH CORONARY ANGIOGRAM N/A 03/17/2013   Procedure: LEFT AND RIGHT HEART CATHETERIZATION WITH CORONARY ANGIOGRAM;  Surgeon: Burnell Blanks, MD;  Location: [facility] CATH LAB;  Service: Cardiovascular;  Laterality: N/A;  . LEFT AND RIGHT HEART CATHETERIZATION WITH CORONARY/GRAFT  ANGIOGRAM N/A 09/14/2014   Procedure: LEFT AND RIGHT HEART CATHETERIZATION WITH Beatrix Fetters;  Surgeon: Burnell Blanks, MD;  Location: [facility] CATH LAB;  Service: Cardiovascular;  Laterality: N/A;  . TEE WITHOUT CARDIOVERSION N/A 03/14/2013   Procedure: TRANSESOPHAGEAL ECHOCARDIOGRAM (TEE);  Surgeon: Lelon Perla, MD;  Location: [facility] ENDOSCOPY;  Service: Cardiovascular;  Laterality: N/A;  . TEE WITHOUT CARDIOVERSION N/A 11/14/2014   Procedure: TRANSESOPHAGEAL ECHOCARDIOGRAM (TEE);  Surgeon: Lelon Perla, MD;  Location: [facility] ENDOSCOPY;  Service: Cardiovascular;  Laterality: N/A;  . TONSILLECTOMY     maybe as a child- does not know  . TOTAL KNEE ARTHROPLASTY  09/10/2012   Procedure: TOTAL KNEE ARTHROPLASTY;  Surgeon: Mcarthur Rossetti, MD;  [facility];  Service: Orthopedics;  Laterality: Left;  . TOTAL KNEE REVISION Right 07/15/2013   Procedure: REVISION ARTHROPLASTY RIGHT KNEE;  Surgeon: Mcarthur Rossetti, MD;  [facility];  Service: Orthopedics;  Laterality: Right;  . TRANSCATHETER AORTIC VALVE REPLACEMENT, TRANSFEMORAL N/A 12/12/2014   Procedure: TRANSCATHETER AORTIC VALVE REPLACEMENT, TRANSFEMORAL;  Surgeon: Burnell Blanks, MD;  Location: [facility];  Service: Cardiovascular;  Laterality: N/A;  . TRIGGER FINGER RELEASE  09/10/2012   Procedure:  RELEASE TRIGGER FINGER/A-1 PULLEY;  Surgeon: Mcarthur Rossetti, MD;  [facility];  Service: Orthopedics;  Laterality: Right;  Right Ring Finger  . TUBAL LIGATION     Social History   Occupational History  . Occupation: Disabled  Tobacco Use  . Smoking status: Former Smoker    Packs/day: 1.50    Years: 30.00    Pack years: 45.00    Types: Cigarettes    Quit date: 12/29/2000    Years since quitting: 20.2  . Smokeless tobacco: Never Used  Vaping Use  . Vaping Use: Never used  Substance and Sexual Activity  . Alcohol use: Not Currently  . Drug use: Not Currently  . Sexual activity: Not Currently    Birth control/protection: Surgical

## 2021-03-28 NOTE — Telephone Encounter (Signed)
Return call to pt - states pharmacy told her she will need a PA for Trelegy. I will send request to [facility].

## 2021-03-28 NOTE — Telephone Encounter (Signed)
Is requesting a call back about her medication

## 2021-03-29 DIAGNOSIS — Z419 Encounter for procedure for purposes other than remedying health state, unspecified: Secondary | ICD-10-CM | POA: Diagnosis not present

## 2021-04-03 ENCOUNTER — Other Ambulatory Visit: Payer: Self-pay

## 2021-04-03 ENCOUNTER — Ambulatory Visit (INDEPENDENT_AMBULATORY_CARE_PROVIDER_SITE_OTHER): Payer: Medicaid Other | Admitting: Cardiovascular Disease

## 2021-04-03 ENCOUNTER — Encounter: Payer: Self-pay | Admitting: Cardiovascular Disease

## 2021-04-03 ENCOUNTER — Telehealth: Payer: Self-pay | Admitting: *Deleted

## 2021-04-03 VITALS — BP 130/70 | HR 70 | Ht 63.0 in | Wt 305.0 lb

## 2021-04-03 DIAGNOSIS — E7800 Pure hypercholesterolemia, unspecified: Secondary | ICD-10-CM

## 2021-04-03 DIAGNOSIS — I350 Nonrheumatic aortic (valve) stenosis: Secondary | ICD-10-CM

## 2021-04-03 DIAGNOSIS — I2510 Atherosclerotic heart disease of native coronary artery without angina pectoris: Secondary | ICD-10-CM

## 2021-04-03 NOTE — Patient Instructions (Signed)
Medication Instructions:  No changes *If you need a refill on your cardiac medications before your next appointment, please call your pharmacy*   Lab Work: none If you have labs (blood work) drawn today and your tests are completely normal, you will receive your results only by: . MyChart Message (if you have MyChart) OR . A paper copy in the mail If you have any lab test that is abnormal or we need to change your treatment, we will call you to review the results.   Testing/Procedures: none   Follow-Up: At [facility], you and your health needs are our priority.  As part of our continuing mission to provide you with exceptional heart care, we have created designated Provider Care Teams.  These Care Teams include your primary Cardiologist (physician) and Advanced Practice Providers (APPs -  Physician Assistants and Nurse Practitioners) who all work together to provide you with the care you need, when you need it.    Your next appointment:   12 month(s)  The format for your next appointment:   In Person  Provider:   You may see Christopher McAlhany, MD or one of the following Advanced Practice Providers on your designated Care Team:    Dayna Dunn, PA-C  Michele Lenze, PA-C    Other Instructions   

## 2021-04-03 NOTE — Progress Notes (Signed)
Chief Complaint  Patient presents with  . Follow-up    CAD   History of Present Illness: 60 yo female with history of severe aortic stenosis now s/p TAVR,  CAD, OSA, HTN, HLD, obesity, GERD, asthma, COPD, former tobacco abuse, depression here today for cardiac follow up. She had a cardiac cath in February 2010 and had a drug eluting stent (2.19m Xience) placed in the distal RCA. Her LAD and Circumflex were free of disease. She had her right knee replaced in February 27026 complicated by a PE. She was started on coumadin and treated for 6 months. CTA chest on 08/09/12 showed no evidence of PE so her coumadin was stopped. I saw her in February 2014 and she c/o severe dyspnea with minimal exertion. I felt that this was multi-factorial. Chest CTA with no PE. I arranged a stress myoview which showed possible ischemia. Echo with severe AS, normal LV function. Cardiac cath on 03/17/13 with mild non-obstructive disease and patent stent RCA. She continued to have severe dyspnea. Most recent cath 09/14/14 with 70% ostial RCA stenosis treated medically and no obstructive disease in the LAD or Circumflex. She underwent TAVR on 12/12/14 and did well. (26 mm Sapien 3 bioprosthetic valve). Echo December 2016 with normally functioning bioprosthetic aortic valve. Most recent echo January 2022 with LVEF=60-65%, normal RV function. Mild mitral stenosis. Normally functioning AVR (TAVR). She had back surgery and is has helped some in her pain.   She is here today for follow up. The patient denies any chest pain, dyspnea, palpitations, lower extremity edema, orthopnea, PND, dizziness, near syncope or syncope.     Primary Care Physician: BSanjuan Dame MD  Past Medical History:  Diagnosis Date  . Anemia   . Aortic valve stenosis, severe   . 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 Asthma   . Blood transfusion     2013-[facility] . Breast cancer in female ([facility]    Right  . CAD (coronary artery disease)    Cath 2010 with DES x 1 RCA-- PT'S CARDIOLOGIST IS DR. [place] . Chronic diastolic congestive heart failure ([place]   . COPD (chronic obstructive pulmonary disease) ([place]   . Depression   . Diabetes mellitus DIAGNOSED IN2010   Controll s with diet  . Dyspnea    with much ambulation  . Eczema    on back  . Headache    migraines younger- rare now 02/07/21  . Heart murmur   . History of hiatal hernia   . History of kidney stones    passed or blasted  . Hyperlipidemia   . Hypertension   . Morbid obesity with body mass index of 60.0-69.9 in adult ([facility]   . Myocardial infarction ([place]    PT THINKS SHE WAS DX WITH MI AT THE TIME OF HEART STENTING  . Neuromuscular disorder ([place]    bilateral hands  . Pneumonia   . [place]monary embolism ([place] 02/08/12   S/P RT TOTAL KNEE ON 02/03/12--ON 02/08/12--DEVELOPED ACUTE SOB AND CHEST PAIN--AND DIAGNOSED WITH  PULMONARY EMBOLUS AND PNEUMONIA  . Restless leg syndrome   . Sleep apnea    uses 3 liters O2 at night   . Uterine fibroid    NO PROBLEMS AT PRESENT FROM THE FIBROIDS-STATES SHE IS POST MENOPAUSAL-LAST MENSES 2010 EXCEPT FOR EPISODE THIS YR OF BLEEDING RELATED TO  FIBROIDS.  Marland Kitchen Weakness    BOTH HANDS - S/P BILATERAL CARPAL TUNNEL RELEASE--BUT STILL HAS WEAKNESS--OFTEN DROPS THINGS    Past Surgical History:  Procedure Laterality Date  . BREAST LUMPECTOMY Right 06/2018  . BREAST LUMPECTOMY WITH RADIOACTIVE SEED AND SENTINEL LYMPH NODE BIOPSY Right 07/19/2018   Procedure: BREAST LUMPECTOMY WITH RADIOACTIVE SEED AND SENTINEL LYMPH NODE BIOPSY;  Surgeon: Alphonsa Overall, MD;  Location: [place];  Service: General;  Laterality: Right;  . CARDIAC CATHETERIZATION    . CARDIAC VALVE REPLACEMENT     2017  . CARPAL TUNNEL RELEASE     Bilateral  . CHOLECYSTECTOMY    . CORONARY ANGIOPLASTY     2010 has stent in place  . CYSTOSCOPY W/ RETROGRADES Right 09/21/2013    Procedure: CYSTOSCOPY WITH RIGHT RETROGRADE PYELOGRAM RIGHT DOUBLE J STENT ;  Surgeon: Fredricka Bonine, MD;  [facility];  Service: Urology;  Laterality: Right;  . CYSTOSCOPY WITH URETEROSCOPY AND STENT PLACEMENT Right 10/25/2013   Procedure: CYSTOSCOPY RIGHT URETEROSCOPY HOLMIUM LASER LITHO AND STENT PLACEMENT;  Surgeon: Fredricka Bonine, MD;  [facility];  Service: Urology;  Laterality: Right;  . HERNIA REPAIR    . INTRAOPERATIVE TRANSESOPHAGEAL ECHOCARDIOGRAM N/A 12/12/2014   Procedure: INTRAOPERATIVE TRANSESOPHAGEAL ECHOCARDIOGRAM;  Surgeon: Burnell Blanks, MD;  Location[facility];  Service: Cardiovascular;  Laterality: N/A;  . JOINT REPLACEMENT     bil total knees  . KNEE ARTHROPLASTY  02/03/2012   Procedure: COMPUTER ASSISTED TOTAL KNEE ARTHROPLASTY;  Surgeon: Mcarthur Rossetti, MD;  Location: [place];  Service: Orthopedics;  Laterality: Right;  Right total knee arthroplasty  . LEFT AND RIGHT HEART CATHETERIZATION WITH CORONARY ANGIOGRAM N/A 03/17/2013   Procedure: LEFT AND RIGHT HEART CATHETERIZATION WITH CORONARY ANGIOGRAM;  Surgeon: Burnell Blanks, MD;  Location: [facility] CATH LAB;  Service: Cardiovascular;  Laterality: N/A;  . LEFT AND RIGHT HEART CATHETERIZATION WITH CORONARY/GRAFT ANGIOGRAM N/A 09/14/2014   Procedure: LEFT AND RIGHT HEART CATHETERIZATION WITH Beatrix Fetters;  Surgeon: Burnell Blanks, MD;  Location: [facility] CATH LAB;  Service: Cardiovascular;  Laterality: N/A;  . TEE WITHOUT CARDIOVERSION N/A 03/14/2013   Procedure: TRANSESOPHAGEAL ECHOCARDIOGRAM (TEE);  Surgeon: Lelon Perla, MD;  Location: [facility] ENDOSCOPY;  Service: Cardiovascular;  Laterality: N/A;  . TEE WITHOUT CARDIOVERSION N/A 11/14/2014   Procedure: TRANSESOPHAGEAL ECHOCARDIOGRAM (TEE);  Surgeon: Lelon Perla, MD;  Location: [facility] ENDOSCOPY;  Service: Cardiovascular;  Laterality: N/A;  . TONSILLECTOMY     maybe as a child- does not know  . TOTAL KNEE ARTHROPLASTY   09/10/2012   Procedure: TOTAL KNEE ARTHROPLASTY;  Surgeon: Mcarthur Rossetti, MD;  [facility];  Service: Orthopedics;  Laterality: Left;  . TOTAL KNEE REVISION Right 07/15/2013   Procedure: REVISION ARTHROPLASTY RIGHT KNEE;  Surgeon: Mcarthur Rossetti, MD;  [facility];  Service: Orthopedics;  Laterality: Right;  . TRANSCATHETER AORTIC VALVE REPLACEMENT, TRANSFEMORAL N/A 12/12/2014   Procedure: TRANSCATHETER AORTIC VALVE REPLACEMENT, TRANSFEMORAL;  Surgeon: Burnell Blanks, MD;  Location: [place];  Service: Cardiovascular;  Laterality: N/A;  . TRIGGER FINGER RELEASE  09/10/2012   Procedure: RELEASE TRIGGER FINGER/A-1 PULLEY;  Surgeon: Mcarthur Rossetti, MD;  Location[facility];  Service: Orthopedics;  Laterality: Right;  Right Ring Finger  . TUBAL LIGATION      Current Outpatient Medications  Medication Sig Dispense Refill  . Accu-Chek FastClix Lancets MISC Check blood sugar two times a day 102 each 9  . acetaminophen (TYLENOL) 500 MG tablet Take 1,000 mg  by mouth every 6 (six) hours as needed for mild pain.    Marland Kitchen amlodipine-atorvastatin (CADUET) 5-80 MG tablet Take 1 tablet by mouth daily. 90 tablet 3  . Ascorbic Acid (VITAMIN C) 1000 MG tablet Take 1,000 mg by mouth daily.    . ASPIRIN ADULT LOW STRENGTH 81 MG EC tablet TAKE 1 Tablet BY MOUTH ONCE DAILY 90 tablet 3  . Blood Glucose Monitoring Suppl (ACCU-CHEK GUIDE) w/Device KIT 1 each by Does not apply route 2 (two) times daily. 1 kit 1  . clopidogrel (PLAVIX) 75 MG tablet Take 1 tablet (75 mg total) by mouth every morning. 90 tablet 1  . docusate sodium (COLACE) 100 MG capsule Take 1 capsule (100 mg total) by mouth 2 (two) times daily. 40 capsule 0  . DULoxetine (CYMBALTA) 60 MG capsule Take 1 capsule (60 mg total) by mouth daily. 90 capsule 2  . Evolocumab (REPATHA SURECLICK) 466 MG/ML SOAJ Inject 1 pen into the skin every 14 (fourteen) days. 2 mL 11  . exemestane (AROMASIN) 25 MG tablet TAKE 1 TABLET BY MOUTH  ONCE DAILY AFTER BREAKFAST 90 tablet 0  . ferrous gluconate (FERGON) 324 MG tablet Take 1 tablet (324 mg total) by mouth 2 (two) times daily with a meal. 60 tablet 1  . Fluticasone-Umeclidin-Vilant (TRELEGY ELLIPTA) 100-62.5-25 MCG/INH AEPB Inhale 1 puff into the lungs daily. 60 each 2  . furosemide (LASIX) 40 MG tablet Take 1 tablet (40 mg total) by mouth as needed for fluid or edema. 180 tablet 1  . gabapentin (NEURONTIN) 300 MG capsule TAKE 3 CAPSULES BY MOUTH ONCE DAILY AT BEDTIME 90 capsule 0  . glucose blood (ACCU-CHEK GUIDE) test strip Check blood sugar 2 times per day 100 each 9  . HYDROcodone-acetaminophen (NORCO) 7.5-325 MG tablet Take 1-2 tablets by mouth every 6 (six) hours as needed for moderate pain. 30 tablet 0  . hydrocortisone cream 1 % Apply 1 application topically daily as needed for itching.     . Insulin Pen Needle (PEN NEEDLES) 32G X 4 MM MISC 1.8 mg by Does not apply route daily. 300 each 2  . liraglutide (VICTOZA) 18 MG/3ML SOPN Inject 0.3 mLs (1.8 mg total) into the skin daily. 5 pen 5  . losartan-hydrochlorothiazide (HYZAAR) 100-25 MG tablet Take 1 tablet by mouth daily. 90 tablet 3  . Melatonin 5 MG CAPS Take 5 mg by mouth at bedtime as needed (insomnia).    . metoprolol succinate (TOPROL XL) 50 MG 24 hr tablet Take 1 tablet (50 mg total) by mouth daily. Take with or immediately following a meal. 90 tablet 3  . nitroGLYCERIN (NITROSTAT) 0.4 MG SL tablet Place 1 tablet (0.4 mg total) under the tongue every 5 (five) minutes as needed for chest pain. 100 tablet 0  . OXYGEN Inhale 3 L into the lungs as needed.    Marland Kitchen PROAIR HFA 108 (90 Base) MCG/ACT inhaler INHALE 2 PUFFS BY MOUTH EVERY 4 HOURS AS NEEDED FOR COUGH, WHEEZING, AND SHORTNESS OF BREATH 18 g 2   No current facility-administered medications for this visit.    No Known Allergies  Social History   Socioeconomic History  . Marital status: Married    Spouse name: Not on file  . Number of children: 2  . Years of  education: Not on file  . Highest education level: Not on file  Occupational History  . Occupation: Disabled  Tobacco Use  . Smoking status: Former Smoker    Packs/day: 1.50  Years: 30.00    Pack years: 45.00    Types: Cigarettes    Quit date: 12/29/2000    Years since quitting: 20.2  . Smokeless tobacco: Never Used  Vaping Use  . Vaping Use: Never used  Substance and Sexual Activity  . Alcohol use: Not Currently  . Drug use: Not Currently  . Sexual activity: Not Currently    Birth control/protection: Surgical  Other Topics Concern  . Not on file  Social History Narrative  . Not on file   Social Determinants of Health   Financial Resource Strain: Not on file  Food Insecurity: Not on file  Transportation Needs: Not on file  Physical Activity: Not on file  Stress: Not on file  Social Connections: Not on file  Intimate Partner Violence: Not on file    Family History  Problem Relation Age of Onset  . Breast cancer Mother        stage IV at diagnosis  . Emphysema Mother        smoked  . Heart disease Mother   . COPD Father        smoked  . Asthma Father   . Heart disease Father   . Cancer Brother        Sinus    Review of Systems:  As stated in the HPI and otherwise negative.   BP 130/70   Pulse 70   Ht 5' 3" (1.6 m)   Wt (!) 305 lb (138.3 kg)   LMP 02/03/2012   SpO2 97%   BMI 54.03 kg/m   Physical Examination:  General: Well developed, well nourished, NAD  HEENT: OP clear, mucus membranes moist  SKIN: warm, dry. No rashes. Neuro: No focal deficits  Musculoskeletal: Muscle strength 5/5 all ext  Psychiatric: Mood and affect normal  Neck: No JVD, no carotid bruits, no thyromegaly, no lymphadenopathy.  Lungs:Clear bilaterally, no wheezes, rhonci, crackles Cardiovascular: Regular rate and rhythm. No murmurs, gallops or rubs. Abdomen:Soft. Bowel sounds present. Non-tender.  Extremities: No lower extremity edema. Pulses are 2 + in the bilateral  DP/PT.  Cardiac cath 09/14/14: Left main: No obstructive disease.  Left Anterior Descending Artery: Large caliber vessel that courses to the apex. There is a moderate caliber diagonal branch. No obstructive disease noted.  Circumflex Artery: Large caliber vessel with two small obtuse marginal branches and a moderate caliber posterolateral branch. No obstructive disease noted.  Right Coronary Artery: Large dominant vessel with 70-80% ostial stenosis. 40% proximal stenosis. Distal stented segment is patent with no restenosis.  Left Ventricular Angiogram: Deferred.    Echo January 2022: 1. Left ventricular ejection fraction, by estimation, is 60 to 65%. The  left ventricle has normal function. The left ventricle has no regional  wall motion abnormalities. There is mild concentric left ventricular  hypertrophy. Left ventricular diastolic  parameters are indeterminate.  2. Right ventricular systolic function is normal. The right ventricular  size is normal.  3. The mitral valve is abnormal. No evidence of mitral valve  regurgitation. Mild mitral stenosis. The mean mitral valve gradient is 4.0  mmHg with average heart rate of 77 bpm.  4. The aortic valve has been repaired/replaced. Aortic valve  regurgitation is not visualized. 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 Procedure Date: 12/12/2014. Aortic  valve mean gradient measures 15.0 mmHg.  5. The inferior vena cava is normal in size with greater than 50%  respiratory variability, suggesting right atrial pressure of  3 mmHg.   EKG:  EKG is not ordered today. The ekg ordered today demonstrates    Recent Labs: 09/25/2020: TSH 2.820 02/11/2021: ALT 16 02/13/2021: BUN 24; Creatinine, Ser 0.98; Hemoglobin 9.9; Platelets 147; Potassium 3.8; Sodium 135   Lipid Panel    Component Value Date/Time   CHOL 130 10/17/2020 0924   TRIG 84 10/17/2020 0924   HDL 50 10/17/2020 0924   CHOLHDL 2.6 10/17/2020  0924   CHOLHDL 3.7 12/28/2015 1052   VLDL 20 12/28/2015 1052   LDLCALC 64 10/17/2020 0924   LDLDIRECT 162.0 10/17/2013 0905     Wt Readings from Last 3 Encounters:  04/03/21 (!) 305 lb (138.3 kg)  03/28/21 (!) 309 lb (140.2 kg)  03/04/21 (!) 309 lb (140.2 kg)     Other studies Reviewed: Additional studies/ records that were reviewed today include: . Review of the above records demonstrates:    Assessment and Plan:   1. Severe Aortic valve stenosis: She is s/p TAVR on 12/12/14. Her valve was working well by echo in January 2022. Will continue ASA/Plavix given CAD and AVR. She is aware that she should use SBE prophylaxis prior to select procedures including dental visits.    2. CAD without angina: Cardiac cath September 2015 with moderate ostial RCA stenosis and no disease in the LAD or Circumflex. No chest pain. Continue ASA, Plavix, statin and beta blocker.     3. Hyperlipidemia: LDL at goal in October 2021. Continue Repatha and statin.      4. Morbid obesity: Weight loss is advised. She is working on this and losing some weight slowly.   Current medicines are reviewed at length with the patient today.  The patient does not have concerns regarding medicines.  The following changes have been made:  no change  Labs/ tests ordered today include:   No orders of the defined types were placed in this encounter.   Disposition:   FU with me in 12  months  Signed, Lauree Chandler, MD 04/03/2021 10:47 AM    [facility] [address], [place], [place]  [address] Phone: [phone]; Fax: [phone]

## 2021-04-03 NOTE — Telephone Encounter (Addendum)
Information was sent through CoverMyMeds for PA for Trelogy.  Awaiting determination within 24 to 72 hours from [facility]. Sander Nephew, RN 04/03/2021 3:15 PM.  Fax from [place] has been approved 03/27/2021 until further notice.  Sander Nephew , RN 04/04/2021 10:06 AM.

## 2021-04-04 NOTE — Telephone Encounter (Signed)
Trelegy has been approved until further notice.

## 2021-04-08 ENCOUNTER — Other Ambulatory Visit: Payer: Self-pay

## 2021-04-08 MED ORDER — HYDROCODONE-ACETAMINOPHEN 7.5-325 MG PO TABS: 1.0000 | ORAL_TABLET | Freq: Four times a day (QID) | ORAL | 0 refills | Status: DC | PRN

## 2021-04-08 NOTE — Telephone Encounter (Signed)
Pt would like a refill on her Hydrocodone 7.5 -325 mg tablets

## 2021-04-13 ENCOUNTER — Other Ambulatory Visit: Payer: Self-pay | Admitting: Student

## 2021-04-13 ENCOUNTER — Other Ambulatory Visit: Payer: Self-pay | Admitting: Internal Medicine

## 2021-04-13 ENCOUNTER — Other Ambulatory Visit: Payer: Self-pay | Admitting: Oncology

## 2021-04-13 DIAGNOSIS — E119 Type 2 diabetes mellitus without complications: Secondary | ICD-10-CM

## 2021-04-13 DIAGNOSIS — I2510 Atherosclerotic heart disease of native coronary artery without angina pectoris: Secondary | ICD-10-CM

## 2021-04-13 DIAGNOSIS — I350 Nonrheumatic aortic (valve) stenosis: Secondary | ICD-10-CM

## 2021-04-25 ENCOUNTER — Ambulatory Visit (INDEPENDENT_AMBULATORY_CARE_PROVIDER_SITE_OTHER): Payer: Medicaid Other

## 2021-04-25 ENCOUNTER — Encounter: Payer: Self-pay | Admitting: Specialist

## 2021-04-25 ENCOUNTER — Ambulatory Visit (INDEPENDENT_AMBULATORY_CARE_PROVIDER_SITE_OTHER): Payer: Medicaid Other | Admitting: Specialist

## 2021-04-25 ENCOUNTER — Other Ambulatory Visit: Payer: Self-pay

## 2021-04-25 VITALS — BP 109/78 | HR 103 | Ht 63.0 in | Wt 305.0 lb

## 2021-04-25 DIAGNOSIS — M542 Cervicalgia: Secondary | ICD-10-CM

## 2021-04-25 DIAGNOSIS — R202 Paresthesia of skin: Secondary | ICD-10-CM

## 2021-04-25 DIAGNOSIS — Z981 Arthrodesis status: Secondary | ICD-10-CM

## 2021-04-25 DIAGNOSIS — R200 Anesthesia of skin: Secondary | ICD-10-CM | POA: Diagnosis not present

## 2021-04-25 NOTE — Progress Notes (Signed)
Post-Op Visit Note   Patient: [name]           Date of Birth: 1961-08-10           MRN: [id number] Visit Date: 04/25/2021 PCP: [name], MD   Assessment & Plan:2 1/2 months post op lumbar TLIF Left L4-5 for spondylolisthesis   Chief Complaint:  Chief Complaint  Patient presents with  . Lower Back - Pain  Incision is healed Motor is normal Pain with bending and pain in the upper thoracic and posterior base of the neck  Mid way between base of the neck and the shoulder blades. The right arm is tingling and numbness from the right elbow downwards, history of CTS on both and it helped for a while. The tingling onto the right thumb side. Visit Diagnoses: No diagnosis found.  Plan: Avoid frequent bending and stooping  No lifting greater than 10 lbs. May use ice or moist heat for pain. Weight loss is of benefit. May wean from using the brace 1/2 days in the brace for 2 weeks then discontinue or remove increasing 2 hours per day till at 8 hours per day out of brace then discontinue.Marland Kitchen Exercise is important to improve your indurance and does allow people to function better inspite of back pain.    Follow-Up Instructions: No follow-ups on file.   Orders:  No orders of the defined types were placed in this encounter.  No orders of the defined types were placed in this encounter.   Imaging: No results found.  PMFS History: Patient Active Problem List   Diagnosis Date Noted  . Spondylolisthesis, lumbar region 02/11/2021    Priority: High    Class: Acute  . Status post lumbar spinal fusion 02/11/2021  . URI with cough and congestion 12/04/2020  . Neuropathy of forearm, right 09/25/2020  . Shoulder pain, left 09/25/2020  . Tick bite 04/06/2020  . Stiffness of hand joint 06/09/2019  . Right arm numbness 06/09/2019  . Low back pain 05/02/2019  . Pain of left hip joint 08/09/2018  . Malignant neoplasm of upper-outer quadrant of right breast in female,  estrogen receptor positive ([place]) 06/10/2018  . Screening for colon cancer 03/12/2018  . Screening for breast cancer 03/12/2018  . Restless leg syndrome 02/04/2018  . 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 Depression 08/06/2015  . Hypertension associated with diabetes ([place])   . Aortic stenosis   . Chronic diastolic congestive heart failure ([place])   . COPD (chronic obstructive pulmonary disease) ([place]) 09/21/2013  . Obstructive sleep apnea 09/21/2013  . Nocturnal hypoxemia 09/12/2013  . Abnormal CT scan, chest 05/12/2013  . Hyperlipidemia 11/30/2012  . History of pulmonary embolus (PE) 07/09/2012  . Normocytic anemia 02/08/2012  . Type 2 diabetes mellitus without complication, without long-term current use of insulin ([place]) 02/08/2012  . Asthma 02/08/2012  . GERD (gastroesophageal reflux disease)   . CAD (coronary artery disease) 12/08/2011   Past Medical History:  Diagnosis Date  . Anemia   . Aortic valve stenosis, severe   . 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 Asthma   . Blood transfusion    2013[facility]  . Breast cancer in female [facility])    Right  . CAD (coronary artery disease)    Cath 2010 with DES x 1 RCA-- PT'S CARDIOLOGIST IS DR. Castleberry  .  Chronic diastolic congestive heart failure ([place])   . COPD (chronic obstructive pulmonary disease) ([place])   . Depression   . Diabetes mellitus DIAGNOSED IN2010   Controll s with diet  . Dyspnea    with much ambulation  . Eczema    on back  . Headache    migraines younger- rare now 02/07/21  . Heart murmur   . History of hiatal hernia   . History of kidney stones    passed or blasted  . Hyperlipidemia   . Hypertension   . Morbid obesity with body mass index of 60.0-69.9 in adult [facility])   . Myocardial infarction ([place])    PT THINKS SHE WAS DX WITH MI AT THE TIME OF HEART STENTING  . Neuromuscular disorder  ([place])    bilateral hands  . Pneumonia   . Pulmonary embolism ([place]) 02/08/12   S/P RT TOTAL KNEE ON 02/03/12--ON 02/08/12--DEVELOPED ACUTE SOB AND CHEST PAIN--AND DIAGNOSED WITH  PULMONARY EMBOLUS AND PNEUMONIA  . Restless leg syndrome   . Sleep apnea    uses 3 liters O2 at night   . Uterine fibroid    NO PROBLEMS AT PRESENT FROM THE FIBROIDS-STATES SHE IS POST MENOPAUSAL-LAST MENSES 2010 EXCEPT FOR EPISODE THIS YR OF BLEEDING RELATED TO FIBROIDS.  Marland Kitchen Weakness    BOTH HANDS - S/P BILATERAL CARPAL TUNNEL RELEASE--BUT STILL HAS WEAKNESS--OFTEN DROPS THINGS    Family History  Problem Relation Age of Onset  . Breast cancer Mother        stage IV at diagnosis  . Emphysema Mother        smoked  . Heart disease Mother   . COPD Father        smoked  . Asthma Father   . Heart disease Father   . Cancer Brother        Sinus    Past Surgical History:  Procedure Laterality Date  . BREAST LUMPECTOMY Right 06/2018  . BREAST LUMPECTOMY WITH RADIOACTIVE SEED AND SENTINEL LYMPH NODE BIOPSY Right 07/19/2018   Procedure: BREAST LUMPECTOMY WITH RADIOACTIVE SEED AND SENTINEL LYMPH NODE BIOPSY;  Surgeon: Alphonsa Overall, MD;  Location: [place];  Service: General;  Laterality: Right;  . CARDIAC CATHETERIZATION    . CARDIAC VALVE REPLACEMENT     2017  . CARPAL TUNNEL RELEASE     Bilateral  . CHOLECYSTECTOMY    . CORONARY ANGIOPLASTY     2010 has stent in place  . CYSTOSCOPY W/ RETROGRADES Right 09/21/2013   Procedure: CYSTOSCOPY WITH RIGHT RETROGRADE PYELOGRAM RIGHT DOUBLE J STENT ;  Surgeon: Fredricka Bonine, MD;  Location[facility];  Service: Urology;  Laterality: Right;  . CYSTOSCOPY WITH URETEROSCOPY AND STENT PLACEMENT Right 10/25/2013   Procedure: CYSTOSCOPY RIGHT URETEROSCOPY HOLMIUM LASER LITHO AND STENT PLACEMENT;  Surgeon: Fredricka Bonine, MD;  Location: [facility];  Service: Urology;  Laterality: Right;  . HERNIA REPAIR    . INTRAOPERATIVE TRANSESOPHAGEAL ECHOCARDIOGRAM N/A 12/12/2014    Procedure: INTRAOPERATIVE TRANSESOPHAGEAL ECHOCARDIOGRAM;  Surgeon: Burnell Blanks, MD;  Location: [facility] OR;  Service: Cardiovascular;  Laterality: N/A;  . JOINT REPLACEMENT     bil total knees  . KNEE ARTHROPLASTY  02/03/2012   Procedure: COMPUTER ASSISTED TOTAL KNEE ARTHROPLASTY;  Surgeon: Mcarthur Rossetti, MD;  Location: [facility];  Service: Orthopedics;  Laterality: Right;  Right total knee arthroplasty  . LEFT AND RIGHT HEART CATHETERIZATION WITH CORONARY ANGIOGRAM N/A 03/17/2013   Procedure: LEFT AND RIGHT HEART CATHETERIZATION WITH CORONARY ANGIOGRAM;  Surgeon: Burnell Blanks, MD;  Location[facility] C[facility] LAB;  Service: Cardiovascular;  Laterality: N/A;  . LEFT AND RIGHT HEART CATHETERIZATION WITH CORONARY/GRAFT ANGIOGRAM N/A 09/14/2014   Procedure: LEFT AND RIGHT HEART CATHETERIZATION WITH Beatrix Fetters;  Surgeon: Burnell Blanks, MD;  Location: [facility] CATH LAB;  Service: Cardiovascular;  Laterality: N/A;  . TEE WITHOUT CARDIOVERSION N/A 03/14/2013   Procedure: TRANSESOPHAGEAL ECHOCARDIOGRAM (TEE);  Surgeon: Lelon Perla, MD;  Location: [facility] ENDOSCOPY;  Service: Cardiovascular;  Laterality: N/A;  . TEE WITHOUT CARDIOVERSION N/A 11/14/2014   Procedure: TRANSESOPHAGEAL ECHOCARDIOGRAM (TEE);  Surgeon: Lelon Perla, MD;  Location: [facility] ENDOSCOPY;  Service: Cardiovascular;  Laterality: N/A;  . TONSILLECTOMY     maybe as a child- does not know  . TOTAL KNEE ARTHROPLASTY  09/10/2012   Procedure: TOTAL KNEE ARTHROPLASTY;  Surgeon: Mcarthur Rossetti, MD;  [facility];  Service: Orthopedics;  Laterality: Left;  . TOTAL KNEE REVISION Right 07/15/2013   Procedure: REVISION ARTHROPLASTY RIGHT KNEE;  Surgeon: Mcarthur Rossetti, MD;  [facility];  Service: Orthopedics;  Laterality: Right;  . TRANSCATHETER AORTIC VALVE REPLACEMENT, TRANSFEMORAL N/A 12/12/2014   Procedure: TRANSCATHETER AORTIC VALVE REPLACEMENT, TRANSFEMORAL;  Surgeon: Burnell Blanks, MD;   Location: [place];  Service: Cardiovascular;  Laterality: N/A;  . TRIGGER FINGER RELEASE  09/10/2012   Procedure: RELEASE TRIGGER FINGER/A-1 PULLEY;  Surgeon: Mcarthur Rossetti, MD;  [facility];  Service: Orthopedics;  Laterality: Right;  Right Ring Finger  . TUBAL LIGATION     Social History   Occupational History  . Occupation: Disabled  Tobacco Use  . Smoking status: Former Smoker    Packs/day: 1.50    Years: 30.00    Pack years: 45.00    Types: Cigarettes    Quit date: 12/29/2000    Years since quitting: 20.3  . Smokeless tobacco: Never Used  Vaping Use  . Vaping Use: Never used  Substance and Sexual Activity  . Alcohol use: Not Currently  . Drug use: Not Currently  . Sexual activity: Not Currently    Birth control/protection: Surgical

## 2021-04-25 NOTE — Patient Instructions (Signed)
  Plan: Avoid frequent bending and stooping  No lifting greater than 10 lbs. May use ice or moist heat for pain. Weight loss is of benefit. May wean from using the brace 1/2 days in the brace for 2 weeks then discontinue or remove increasing 2 hours per day till at 8 hours per day out of brace then discontinue.Marland Kitchen Exercise is important to improve your indurance and does allow people to function better inspite of back pain.

## 2021-04-28 ENCOUNTER — Other Ambulatory Visit: Payer: Self-pay | Admitting: Internal Medicine

## 2021-04-28 DIAGNOSIS — Z419 Encounter for procedure for purposes other than remedying health state, unspecified: Secondary | ICD-10-CM | POA: Diagnosis not present

## 2021-04-29 ENCOUNTER — Telehealth: Payer: Self-pay

## 2021-04-29 MED ORDER — VICTOZA 18 MG/3ML ~~LOC~~ SOPN: 1.8000 mg | PEN_INJECTOR | Freq: Every day | SUBCUTANEOUS | 0 refills | Status: DC

## 2021-04-29 NOTE — Telephone Encounter (Signed)
Ordered it.

## 2021-04-29 NOTE — Telephone Encounter (Signed)
Pls contact pharmacy [phone] 

## 2021-04-29 NOTE — Telephone Encounter (Signed)
[facility] stated Dr Deedra Ehrich is not covered by Medicaid. I will ask The attending for today to resend rx. Thanks

## 2021-04-30 ENCOUNTER — Other Ambulatory Visit: Payer: Self-pay | Admitting: Specialist

## 2021-04-30 MED ORDER — HYDROCODONE-ACETAMINOPHEN 5-325 MG PO TABS: 1.0000 | ORAL_TABLET | Freq: Four times a day (QID) | ORAL | 0 refills | Status: DC | PRN

## 2021-04-30 NOTE — Telephone Encounter (Signed)
Pt called asking to have her Norco 7.5-325 mg rx refilled please; and she would like to be notified when this is done.   [phone]

## 2021-05-05 ENCOUNTER — Other Ambulatory Visit: Payer: Self-pay | Admitting: Oncology

## 2021-05-06 ENCOUNTER — Telehealth: Payer: Self-pay | Admitting: Oncology

## 2021-05-06 NOTE — Telephone Encounter (Signed)
R/s appts per 5/8 sch msg. Pt aware.  

## 2021-05-08 ENCOUNTER — Encounter: Payer: Self-pay | Admitting: *Deleted

## 2021-05-08 DIAGNOSIS — H02834 Dermatochalasis of left upper eyelid: Secondary | ICD-10-CM | POA: Diagnosis not present

## 2021-05-08 DIAGNOSIS — H0102B Squamous blepharitis left eye, upper and lower eyelids: Secondary | ICD-10-CM | POA: Diagnosis not present

## 2021-05-08 DIAGNOSIS — H01131 Eczematous dermatitis of right upper eyelid: Secondary | ICD-10-CM | POA: Diagnosis not present

## 2021-05-08 DIAGNOSIS — H01132 Eczematous dermatitis of right lower eyelid: Secondary | ICD-10-CM | POA: Diagnosis not present

## 2021-05-08 DIAGNOSIS — H01134 Eczematous dermatitis of left upper eyelid: Secondary | ICD-10-CM | POA: Diagnosis not present

## 2021-05-08 DIAGNOSIS — H43822 Vitreomacular adhesion, left eye: Secondary | ICD-10-CM | POA: Diagnosis not present

## 2021-05-08 DIAGNOSIS — H2513 Age-related nuclear cataract, bilateral: Secondary | ICD-10-CM | POA: Diagnosis not present

## 2021-05-08 DIAGNOSIS — H0102A Squamous blepharitis right eye, upper and lower eyelids: Secondary | ICD-10-CM | POA: Diagnosis not present

## 2021-05-08 DIAGNOSIS — E119 Type 2 diabetes mellitus without complications: Secondary | ICD-10-CM | POA: Diagnosis not present

## 2021-05-08 DIAGNOSIS — H40053 Ocular hypertension, bilateral: Secondary | ICD-10-CM | POA: Diagnosis not present

## 2021-05-08 DIAGNOSIS — H02831 Dermatochalasis of right upper eyelid: Secondary | ICD-10-CM | POA: Diagnosis not present

## 2021-05-08 LAB — HM DIABETES EYE EXAM

## 2021-05-24 ENCOUNTER — Other Ambulatory Visit: Payer: Self-pay | Admitting: Specialist

## 2021-05-24 MED ORDER — HYDROCODONE-ACETAMINOPHEN 5-325 MG PO TABS: 1.0000 | ORAL_TABLET | Freq: Two times a day (BID) | ORAL | 0 refills | Status: AC

## 2021-05-24 NOTE — Addendum Note (Signed)
Addended by: Minda Ditto, Geoffery Spruce on: 05/24/2021 01:28 PM   Modules accepted: Orders

## 2021-05-24 NOTE — Telephone Encounter (Signed)
Patient called requesting a refill of hydrocodone. Please send to pharmacy on file. Patient phone number is [phone].

## 2021-05-28 DIAGNOSIS — Z170 Estrogen receptor positive status [ER+]: Secondary | ICD-10-CM | POA: Diagnosis not present

## 2021-05-28 DIAGNOSIS — C50911 Malignant neoplasm of unspecified site of right female breast: Secondary | ICD-10-CM | POA: Diagnosis not present

## 2021-05-29 ENCOUNTER — Other Ambulatory Visit: Payer: Self-pay | Admitting: Internal Medicine

## 2021-05-29 DIAGNOSIS — Z419 Encounter for procedure for purposes other than remedying health state, unspecified: Secondary | ICD-10-CM | POA: Diagnosis not present

## 2021-05-29 DIAGNOSIS — E119 Type 2 diabetes mellitus without complications: Secondary | ICD-10-CM

## 2021-06-06 ENCOUNTER — Other Ambulatory Visit: Payer: Self-pay | Admitting: Student

## 2021-06-06 DIAGNOSIS — J441 Chronic obstructive pulmonary disease with (acute) exacerbation: Secondary | ICD-10-CM

## 2021-06-06 MED ORDER — PROAIR HFA 108 (90 BASE) MCG/ACT IN AERS: INHALATION_SPRAY | RESPIRATORY_TRACT | 2 refills | Status: DC

## 2021-06-06 NOTE — Telephone Encounter (Signed)
Refill Request-Pt has already called the pharmacy and the patient was told to contact her PCP   [facility] HFA [address]) MCG/ACT inhaler   [place], [place] (Ph: [phone]

## 2021-06-07 ENCOUNTER — Other Ambulatory Visit: Payer: Self-pay

## 2021-06-07 NOTE — Telephone Encounter (Signed)
Patient called she is requesting rx refill for hydrocodone call [phone]

## 2021-06-07 NOTE — Telephone Encounter (Signed)
Please advise 

## 2021-06-10 NOTE — Addendum Note (Signed)
Addended by: Minda Ditto, Alyse Low N on: 06/10/2021 03:09 PM   Modules accepted: Orders

## 2021-06-10 NOTE — Telephone Encounter (Signed)
Please see below request and response from [place]. He feels patient needs follow up appointment with Dr. Louanne Skye.

## 2021-06-11 ENCOUNTER — Other Ambulatory Visit: Payer: Self-pay | Admitting: General Surgery

## 2021-06-11 ENCOUNTER — Other Ambulatory Visit: Payer: Self-pay | Admitting: Surgery

## 2021-06-11 DIAGNOSIS — Z1231 Encounter for screening mammogram for malignant neoplasm of breast: Secondary | ICD-10-CM

## 2021-06-11 DIAGNOSIS — C50911 Malignant neoplasm of unspecified site of right female breast: Secondary | ICD-10-CM

## 2021-06-11 MED ORDER — HYDROCODONE-ACETAMINOPHEN 5-325 MG PO TABS: 1.0000 | ORAL_TABLET | Freq: Three times a day (TID) | ORAL | 0 refills | Status: DC | PRN

## 2021-06-12 ENCOUNTER — Other Ambulatory Visit: Payer: Self-pay

## 2021-06-12 ENCOUNTER — Ambulatory Visit (INDEPENDENT_AMBULATORY_CARE_PROVIDER_SITE_OTHER): Payer: Medicaid Other | Admitting: Physical Medicine and Rehabilitation

## 2021-06-12 ENCOUNTER — Encounter: Payer: Self-pay | Admitting: Physical Medicine and Rehabilitation

## 2021-06-12 DIAGNOSIS — R202 Paresthesia of skin: Secondary | ICD-10-CM | POA: Diagnosis not present

## 2021-06-12 NOTE — Progress Notes (Signed)
Numbness in right hand into second and third fingers. Sometimes has numbness and tingling from elbow down to hand. Right hand dominant No lotion per patient Numeric Pain Rating Scale and Functional Assessment Average Pain 7   In the last MONTH (on 0-10 scale) has pain interfered with the following?  1. General activity like being  able to carry out your everyday physical activities such as walking, climbing stairs, carrying groceries, or moving a chair?  Rating(4)

## 2021-06-13 NOTE — Progress Notes (Signed)
[name] - 60 y.o. female MRN [id number]  Date of birth: 1961/05/10  Office Visit Note: Visit Date: 06/12/2021 PCP: [name], MD Referred by: [name], MD  Subjective: Chief Complaint  Patient presents with   Right Hand - Numbness   HPI:  [name] is a 60 y.o. female who comes in today at the request of Dr. [name] for electrodiagnostic study of the Right upper extremities.  Patient is Right hand dominant.  She describes chronic worsening severe pain with numbness and tingling really from the elbow down into the index and middle finger but sometimes in the thumb.  She also endorses neck pain but not necessarily frank radicular symptoms from the arm down.  Symptoms are consistent with median distribution versus C6 radiculitis radiculopathy.  She has not had recent cervical MRI.  No cervical surgery.  She did have bilateral carpal tunnel releases performed probably over 15 years ago.  No electrodiagnostic studies to review.  She does have a history of diabetes.  Last hemoglobin A1c was 7.3.  She does report some history of neuropathy.  ROS Otherwise per HPI.  Assessment & Plan: Visit Diagnoses:    ICD-10-CM   1. Paresthesia of skin  R20.2 NCV with EMG (electromyography)      Plan: Impression: Essentially NORMAL electrodiagnostic study of the right upper limb.  There is no significant electrodiagnostic evidence of nerve entrapment, brachial plexopathy or cervical radiculopathy.    As you know, purely sensory or demyelinating radiculopathies and chemical radiculitis may not be detected with this particular electrodiagnostic study.  Recommendations: 1.  Follow-up with referring physician. 2.  Continue current management of symptoms.  Meds & Orders: No orders of the defined types were placed in this encounter.   Orders Placed This Encounter  Procedures   NCV with EMG (electromyography)    Follow-up: Return in about 2 weeks (around  06/26/2021) for [name], MD.   Procedures: No procedures performed  EMG & NCV Findings: All nerve conduction studies (as indicated in the following tables) were within normal limits.    All examined muscles (as indicated in the following table) showed no evidence of electrical instability.    Impression: Essentially NORMAL electrodiagnostic study of the right upper limb.  There is no significant electrodiagnostic evidence of nerve entrapment, brachial plexopathy or cervical radiculopathy.    As you know, purely sensory or demyelinating radiculopathies and chemical radiculitis may not be detected with this particular electrodiagnostic study.  Recommendations: 1.  Follow-up with referring physician. 2.  Continue current management of symptoms.  ___________________________ Laurence Spates FAAPMR Board Certified, American Board of Physical Medicine and Rehabilitation    Nerve Conduction Studies Anti Sensory Summary Table   Stim Site NR Peak (ms) Norm Peak (ms) P-T Amp (V) Norm P-T Amp Site1 Site2 Delta-P (ms) Dist (cm) Vel (m/s) Norm Vel (m/s)  Right Median Acr Palm Anti Sensory (2nd Digit)  30.5C  Wrist    3.4 <3.6 25.0 >10 Wrist Palm 1.6 0.0    Palm    1.8 <2.0 38.0         Right Radial Anti Sensory (Base 1st Digit)  30.8C  Wrist    2.2 <3.1 16.5  Wrist Base 1st Digit 2.2 0.0    Right Ulnar Anti Sensory (5th Digit)  30.8C  Wrist    3.1 <3.7 24.5 >15.0 Wrist 5th Digit 3.1 14.0 45 >38   Motor Summary Table   Stim Site NR Onset (ms) Norm Onset (ms) O-P  Amp (mV) Norm O-P Amp Site1 Site2 Delta-0 (ms) Dist (cm) Vel (m/s) Norm Vel (m/s)  Right Median Motor (Abd Poll Brev)  30.8C  Wrist    3.4 <4.2 6.6 >5 Elbow Wrist 3.9 20.0 51 >50  Elbow    7.3  4.1         Right Ulnar Motor (Abd Dig Min)  30.9C  Wrist    3.2 <4.2 8.4 >3 B Elbow Wrist 3.4 18.0 53 >53  B Elbow    6.6  6.8  A Elbow B Elbow 1.7 11.0 65 >53  A Elbow    8.3  6.8          EMG   Side Muscle Nerve Root Ins Act  Fibs Psw Amp Dur Poly Recrt Int Fraser Din Comment  Right Abd Poll Brev Median C8-T1 Nml Nml Nml Nml Nml 0 Nml Nml   Right 1stDorInt Ulnar C8-T1 Nml Nml Nml Nml Nml 0 Nml Nml   Right PronatorTeres Median C6-7 Nml Nml Nml Nml Nml 0 Nml Nml   Right Biceps Musculocut C5-6 Nml Nml Nml Nml Nml 0 Nml Nml   Right Deltoid Axillary C5-6 Nml Nml Nml Nml Nml 0 Nml Nml     Nerve Conduction Studies Anti Sensory Left/Right Comparison   Stim Site L Lat (ms) R Lat (ms) L-R Lat (ms) L Amp (V) R Amp (V) L-R Amp (%) Site1 Site2 L Vel (m/s) R Vel (m/s) L-R Vel (m/s)  Median Acr Palm Anti Sensory (2nd Digit)  30.5C  Wrist  3.4   25.0  Wrist Palm     Palm  1.8   38.0        Radial Anti Sensory (Base 1st Digit)  30.8C  Wrist  2.2   16.5  Wrist Base 1st Digit     Ulnar Anti Sensory (5th Digit)  30.8C  Wrist  3.1   24.5  Wrist 5th Digit  45    Motor Left/Right Comparison   Stim Site L Lat (ms) R Lat (ms) L-R Lat (ms) L Amp (mV) R Amp (mV) L-R Amp (%) Site1 Site2 L Vel (m/s) R Vel (m/s) L-R Vel (m/s)  Median Motor (Abd Poll Brev)  30.8C  Wrist  3.4   6.6  Elbow Wrist  51   Elbow  7.3   4.1        Ulnar Motor (Abd Dig Min)  30.9C  Wrist  3.2   8.4  B Elbow Wrist  53   B Elbow  6.6   6.8  A Elbow B Elbow  65   A Elbow  8.3   6.8           Waveforms:            Clinical History: CLINICAL DATA:  60 year old female with central mid to low back pain  radiating to the sacrum for months to years. Numbness. No prior  surgery.     EXAM:  MRI LUMBAR SPINE WITHOUT CONTRAST     TECHNIQUE:  Multiplanar, multisequence MR imaging of the lumbar spine was  performed. No intravenous contrast was administered.     COMPARISON:  Lumbar radiographs 04/11/2020. CT Abdomen and Pelvis  01/12/2018.     FINDINGS:  Segmentation:  Normal on the comparisons.     Alignment: Grade 1 anterolisthesis at both L4-L5 and L5-S1 was more  apparent on the weight-bearing lumbar radiographs. Lumbar lordosis  otherwise appears  stable since 2019.     Vertebrae: No pars fracture identified.  No marrow edema or evidence  of acute osseous abnormality. Chronic degenerative endplate marrow  signal changes T12-L1 and L1-L2. Normal background bone marrow  signal. Intact visible sacrum and SI joints.     Conus medullaris and cauda equina: Conus extends to the L1 level. C  T12-L1 level details below. No lower spinal cord or conus signal  abnormality.     Paraspinal and other soft tissues: Negative.     Disc levels:     T11-T12. Mild disc bulging and facet hypertrophy. No stenosis.     T12-L1: Severe chronic disc degeneration with chronic vacuum disc.  Circumferential disc osteophyte complex and mild posterior element  hypertrophy. Mild spinal stenosis at the level of the conus (series  3, image 8 and series 6, image 5). No conus mass effect. Mild to  moderate left T12 foraminal stenosis.     L1-L2:  Minor disc bulge. No stenosis.     L2-L3:  Negative.     L3-L4: Mild disc desiccation. Mild facet and ligament flavum  hypertrophy. No stenosis.     L4-L5: Circumferential but mostly far lateral disc bulging. Moderate  facet hypertrophy. Vacuum facet here in 2019. No spinal or lateral  recess stenosis. Mild to moderate left and borderline to mild right  L4 foraminal stenosis.     L5-S1: Minor disc bulge. Severe facet hypertrophy. Vacuum facet here  in 2019. No spinal or lateral recess stenosis. Mild to moderate  bilateral L5 foraminal stenosis.     IMPRESSION:  1. No acute osseous abnormality in the lumbar spine. But grade 1  spondylolisthesis at L4-L5 and L5-S1 as seen on the weight-bearing  radiographs this year is associated with severe chronic facet  arthropathy. No spinal stenosis, but mild to moderate L4 and L5  foraminal stenosis.     2. 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 No conus mass effect. Mild  to moderate left T12 neural  foraminal stenosis.     3. Mild for age lumbar spine degeneration elsewhere.        Electronically Signed    By: Genevie Ann M.D.    On: 05/23/2020 09:37     Objective:  VS:  HT:    WT:   BMI:     BP:   HR: bpm  TEMP: ( )  RESP:  Physical Exam Musculoskeletal:        General: No swelling, tenderness or deformity.     Comments: Inspection reveals bilateral carpal tunnel release surgical scar is well-healed and no atrophy of the bilateral APB or FDI or hand intrinsics. There is no swelling, color changes, allodynia or dystrophic changes. There is 5 out of 5 strength in the bilateral wrist extension, finger abduction and long finger flexion. There is intact sensation to light touch in all dermatomal and peripheral nerve distributions. There is a negative Hoffmann's test bilaterally.  Skin:    General: Skin is warm and dry.     Findings: No erythema or rash.  Neurological:     General: No focal deficit present.     Mental Status: She is alert and oriented to person, place, and time.     Motor: No weakness or abnormal muscle tone.     Coordination: Coordination normal.  Psychiatric:        Mood and Affect: Mood normal.        Behavior: Behavior normal.     Imaging: No results found.

## 2021-06-13 NOTE — Procedures (Signed)
EMG & NCV Findings: All nerve conduction studies (as indicated in the following tables) were within normal limits.    All examined muscles (as indicated in the following table) showed no evidence of electrical instability.    Impression: Essentially NORMAL electrodiagnostic study of the right upper limb.  There is no significant electrodiagnostic evidence of nerve entrapment, brachial plexopathy or cervical radiculopathy.    As you know, purely sensory or demyelinating radiculopathies and chemical radiculitis may not be detected with this particular electrodiagnostic study.  Recommendations: 1.  Follow-up with referring physician. 2.  Continue current management of symptoms.  ___________________________ Laurence Spates FAAPMR Board Certified, American Board of Physical Medicine and Rehabilitation    Nerve Conduction Studies Anti Sensory Summary Table   Stim Site NR Peak (ms) Norm Peak (ms) P-T Amp (V) Norm P-T Amp Site1 Site2 Delta-P (ms) Dist (cm) Vel (m/s) Norm Vel (m/s)  Right Median Acr Palm Anti Sensory (2nd Digit)  30.5C  Wrist    3.4 <3.6 25.0 >10 Wrist Palm 1.6 0.0    Palm    1.8 <2.0 38.0         Right Radial Anti Sensory (Base 1st Digit)  30.8C  Wrist    2.2 <3.1 16.5  Wrist Base 1st Digit 2.2 0.0    Right Ulnar Anti Sensory (5th Digit)  30.8C  Wrist    3.1 <3.7 24.5 >15.0 Wrist 5th Digit 3.1 14.0 45 >38   Motor Summary Table   Stim Site NR Onset (ms) Norm Onset (ms) O-P Amp (mV) Norm O-P Amp Site1 Site2 Delta-0 (ms) Dist (cm) Vel (m/s) Norm Vel (m/s)  Right Median Motor (Abd Poll Brev)  30.8C  Wrist    3.4 <4.2 6.6 >5 Elbow Wrist 3.9 20.0 51 >50  Elbow    7.3  4.1         Right Ulnar Motor (Abd Dig Min)  30.9C  Wrist    3.2 <4.2 8.4 >3 B Elbow Wrist 3.4 18.0 53 >53  B Elbow    6.6  6.8  A Elbow B Elbow 1.7 11.0 65 >53  A Elbow    8.3  6.8          EMG   Side Muscle Nerve Root Ins Act Fibs Psw Amp Dur Poly Recrt Int Fraser Din Comment  Right Abd Poll Brev Median  C8-T1 Nml Nml Nml Nml Nml 0 Nml Nml   Right 1stDorInt Ulnar C8-T1 Nml Nml Nml Nml Nml 0 Nml Nml   Right PronatorTeres Median C6-7 Nml Nml Nml Nml Nml 0 Nml Nml   Right Biceps Musculocut C5-6 Nml Nml Nml Nml Nml 0 Nml Nml   Right Deltoid Axillary C5-6 Nml Nml Nml Nml Nml 0 Nml Nml     Nerve Conduction Studies Anti Sensory Left/Right Comparison   Stim Site L Lat (ms) R Lat (ms) L-R Lat (ms) L Amp (V) R Amp (V) L-R Amp (%) Site1 Site2 L Vel (m/s) R Vel (m/s) L-R Vel (m/s)  Median Acr Palm Anti Sensory (2nd Digit)  30.5C  Wrist  3.4   25.0  Wrist Palm     Palm  1.8   38.0        Radial Anti Sensory (Base 1st Digit)  30.8C  Wrist  2.2   16.5  Wrist Base 1st Digit     Ulnar Anti Sensory (5th Digit)  30.8C  Wrist  3.1   24.5  Wrist 5th Digit  45    Motor Left/Right Comparison  Stim Site L Lat (ms) R Lat (ms) L-R Lat (ms) L Amp (mV) R Amp (mV) L-R Amp (%) Site1 Site2 L Vel (m/s) R Vel (m/s) L-R Vel (m/s)  Median Motor (Abd Poll Brev)  30.8C  Wrist  3.4   6.6  Elbow Wrist  51   Elbow  7.3   4.1        Ulnar Motor (Abd Dig Min)  30.9C  Wrist  3.2   8.4  B Elbow Wrist  53   B Elbow  6.6   6.8  A Elbow B Elbow  65   A Elbow  8.3   6.8           Waveforms:

## 2021-06-24 ENCOUNTER — Other Ambulatory Visit: Payer: Self-pay | Admitting: Cardiovascular Disease

## 2021-06-28 DIAGNOSIS — Z419 Encounter for procedure for purposes other than remedying health state, unspecified: Secondary | ICD-10-CM | POA: Diagnosis not present

## 2021-07-02 ENCOUNTER — Encounter: Payer: Self-pay | Admitting: *Deleted

## 2021-07-10 ENCOUNTER — Ambulatory Visit (INDEPENDENT_AMBULATORY_CARE_PROVIDER_SITE_OTHER): Payer: Medicaid Other

## 2021-07-10 ENCOUNTER — Ambulatory Visit (INDEPENDENT_AMBULATORY_CARE_PROVIDER_SITE_OTHER): Payer: Medicaid Other | Admitting: Specialist

## 2021-07-10 ENCOUNTER — Other Ambulatory Visit: Payer: Self-pay | Admitting: Oncology

## 2021-07-10 ENCOUNTER — Encounter: Payer: Self-pay | Admitting: Specialist

## 2021-07-10 ENCOUNTER — Other Ambulatory Visit: Payer: Self-pay

## 2021-07-10 VITALS — BP 155/75 | HR 95 | Ht 63.0 in | Wt 305.0 lb

## 2021-07-10 DIAGNOSIS — M542 Cervicalgia: Secondary | ICD-10-CM | POA: Diagnosis not present

## 2021-07-10 DIAGNOSIS — R200 Anesthesia of skin: Secondary | ICD-10-CM | POA: Diagnosis not present

## 2021-07-10 DIAGNOSIS — M25512 Pain in left shoulder: Secondary | ICD-10-CM

## 2021-07-10 DIAGNOSIS — R202 Paresthesia of skin: Secondary | ICD-10-CM | POA: Diagnosis not present

## 2021-07-10 DIAGNOSIS — Z981 Arthrodesis status: Secondary | ICD-10-CM

## 2021-07-10 DIAGNOSIS — R29898 Other symptoms and signs involving the musculoskeletal system: Secondary | ICD-10-CM | POA: Diagnosis not present

## 2021-07-10 DIAGNOSIS — M7542 Impingement syndrome of left shoulder: Secondary | ICD-10-CM

## 2021-07-10 MED ORDER — ACETAMINOPHEN 500 MG PO TABS: 500.0000 mg | ORAL_TABLET | Freq: Four times a day (QID) | ORAL | 0 refills | Status: DC | PRN

## 2021-07-10 MED ORDER — MELOXICAM 15 MG PO TABS: 15.0000 mg | ORAL_TABLET | Freq: Every day | ORAL | 2 refills | Status: DC

## 2021-07-10 NOTE — Progress Notes (Signed)
Office Visit Note   Patient: [name]           Date of Birth: 1961-05-30           MRN: [id number] Visit Date: 07/10/2021              Requested by: [name], MD [address] [place],  [place] [address] PCP: [name], MD   Assessment & Plan: Visit Diagnoses:  1. Left shoulder pain, unspecified chronicity   2. Cervicalgia   3. History of lumbar fusion   4. Numbness and tingling of right arm   5. Impingement syndrome of left shoulder   6. Weakness of shoulder     Plan: Avoid frequent bending and stooping  No lifting greater than 10 lbs. May use ice or moist heat for pain. Weight loss is of benefit. Best medication for lumbar disc disease is arthritis medications but you should not take NSAIDs due to use of plavix.  Exercise is important to improve your indurance and does allow people to function better inspite of back pain.  MRI of the left shoulder ordered.   Follow-Up Instructions: Return in about 3 weeks (around 07/31/2021).   Orders:  Orders Placed This Encounter  Procedures   XR Shoulder Left   MR Shoulder Left w/o contrast   Meds ordered this encounter  Medications   DISCONTD: meloxicam (MOBIC) 15 MG tablet    Sig: Take 1 tablet (15 mg total) by mouth daily.    Dispense:  90 tablet    Refill:  2   acetaminophen (TYLENOL) 500 MG tablet    Sig: Take 1 tablet (500 mg total) by mouth every 6 (six) hours as needed.    Dispense:  100 tablet    Refill:  0      Procedures: No procedures performed   Clinical Data: No additional findings.   Subjective: Chief Complaint  Patient presents with   Neck - Pain    60 year old female with history of left shoulder pain and difficulty lifting and using the shoulder overhead and pain with lying on the left shoulder. There is some numbness and tingling into the right arm into the pinky finger. No  Bowel or bladder changes has baseline IBS. Under went lumbar fusion L4-5 for spondylolisthesis  01/2021. Still hurts though with pain towards the tailbone   Review of Systems  Constitutional: Negative.   HENT: Negative.    Eyes: Negative.   Respiratory: Negative.    Cardiovascular: Negative.   Gastrointestinal: Negative.   Endocrine: Negative.   Genitourinary: Negative.   Musculoskeletal: Negative.   Skin: Negative.   Allergic/Immunologic: Negative.   Neurological: Negative.   Hematological: Negative.   Psychiatric/Behavioral: Negative.      Objective: Vital Signs: BP (!) 155/75   Pulse 95   Ht 5\' 3"  (1.6 m)   Wt (!) 305 lb (138.3 kg)   LMP 02/03/2012   BMI 54.03 kg/m   Physical Exam Constitutional:      Appearance: She is well-developed.  HENT:     Head: Normocephalic and atraumatic.  Eyes:     Pupils: Pupils are equal, round, and reactive to light.  Pulmonary:     Effort: Pulmonary effort is normal.     Breath sounds: Normal breath sounds.  Abdominal:     General: Bowel sounds are normal.     Palpations: Abdomen is soft.  Musculoskeletal:     Cervical back: Normal range of motion and neck supple.  Skin:    General: Skin is warm and dry.  Neurological:     Mental Status: She is alert and oriented to person, place, and time.  Psychiatric:        Behavior: Behavior normal.        Thought Content: Thought content normal.        Judgment: Judgment normal.   Back Exam   Tenderness  The patient is experiencing tenderness in the lumbar and cervical.  Muscle Strength  Right Quadriceps:  5/5  Left Quadriceps:  5/5  Right Hamstrings:  5/5  Left Hamstrings:  5/5    Right Elbow Exam   Tenderness  The patient is experiencing no tenderness.   Tests  Tinel's sign (cubital tunnel): negative  Other  Scars: absent   Left Shoulder Exam   Tenderness  The patient is experiencing tenderness in the acromion and clavicle.  Range of Motion  Active abduction:  abnormal  Passive abduction:  abnormal  Extension:  abnormal  External rotation:  abnormal   Forward flexion:  normal  Internal rotation 0 degrees:  L4  Internal rotation 90 degrees:  60   Muscle Strength  Abduction: 4/5  Internal rotation: 4/5  External rotation: 4/5  Supraspinatus: 4/5  Subscapularis: 4/5  Biceps: 5/5   Tests  Impingement: positive Drop arm: positive  Other  Erythema: absent Scars: absent Sensation: normal Pulse: present   Comments:  Drop arm left arm with lifting     Specialty Comments:  No specialty comments available.  Imaging: XR Shoulder Left  Result Date: 07/10/2021 AP axillary lateral and outlet views of the left shoulder shows subacromial space at 16 mm, adequate for cuff to be present but there is DJD of the left G-H joint with an osteophyte over the inferomedial humeral head and sclerosis of the glenoid rim and the inferior humeral head. Soft tissue calcification seen in the area of the subscapularis m. Suggesting calcific tendonitis in this area. Moderate degenerative changes of the AC joint.     PMFS History: Patient Active Problem List   Diagnosis Date Noted   Spondylolisthesis, lumbar region 02/11/2021    Priority: High    Class: Acute   Status post lumbar spinal fusion 02/11/2021   URI with cough and congestion 12/04/2020   Neuropathy of forearm, right 09/25/2020   Shoulder pain, left 09/25/2020   Tick bite 04/06/2020   Stiffness of hand joint 06/09/2019   Right arm numbness 06/09/2019   Low back pain 05/02/2019   Pain of left hip joint 08/09/2018   Malignant neoplasm of upper-outer quadrant of right breast in female, estrogen receptor positive ([place]) 06/10/2018   Screening for colon cancer 03/12/2018   Screening for breast cancer 03/12/2018   Restless leg syndrome 02/04/2018   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   Depression 08/06/2015   Hypertension associated with diabetes ([place])    Aortic stenosis    Chronic diastolic congestive heart failure (HCC)    COPD (chronic obstructive pulmonary disease)  ([place]) 09/21/2013   Obstructive sleep apnea 09/21/2013   Nocturnal hypoxemia 09/12/2013   Abnormal CT scan, chest 05/12/2013   Hyperlipidemia 11/30/2012   History of pulmonary embolus (PE) 07/09/2012   Normocytic anemia 02/08/2012   Type 2 diabetes mellitus without complication, without long-term current use of insulin ([place]) 02/08/2012   Asthma 02/08/2012   GERD (gastroesophageal reflux disease)    CAD (coronary artery disease) 12/08/2011   Past Medical History:  Diagnosis Date   Anemia    Aortic valve  stenosis, severe    Arthritis    PAIN AND SEVERE OA LEFT KNEE ; S/P RIGHT TKA ON 02/03/12; HAS LOWER BACK PAIN-UNABLE TO STAND MORE THAN 10 MIN; ARTHRITIS "ALL OVER"   Asthma    Blood transfusion    2013[facility]   Breast cancer in female [facility])    Right   CAD (coronary artery disease)    Cath 2010 with DES x 1 RCA-- PT'S CARDIOLOGIST IS DR. MCALHANY   Chronic diastolic congestive heart failure (HCC)    COPD (chronic obstructive pulmonary disease) ([place])    Depression    Diabetes mellitus DIAGNOSED IN2010   Controll s with diet   Dyspnea    with much ambulation   Eczema    on back   Headache    migraines younger- rare now 02/07/21   Heart murmur    History of hiatal hernia    History of kidney stones    passed or blasted   Hyperlipidemia    Hypertension    Morbid obesity with body mass index of 60.0-69.9 in adult [facility])    Myocardial infarction ([place])    PT THINKS SHE WAS DX WITH MI AT THE TIME OF HEART STENTING   Neuromuscular disorder ([place])    bilateral hands   Pneumonia    Pulmonary embolism ([place]) 02/08/12   S/P RT TOTAL KNEE ON 02/03/12--ON 02/08/12--DEVELOPED ACUTE SOB AND CHEST PAIN--AND DIAGNOSED WITH  PULMONARY EMBOLUS AND PNEUMONIA   Restless leg syndrome    Sleep apnea    uses 3 liters O2 at night    Uterine fibroid    NO PROBLEMS AT PRESENT FROM THE FIBROIDS-STATES SHE IS POST MENOPAUSAL-LAST MENSES 2010 EXCEPT FOR EPISODE THIS YR OF BLEEDING RELATED TO FIBROIDS.    Weakness    BOTH HANDS - S/P BILATERAL CARPAL TUNNEL RELEASE--BUT STILL HAS WEAKNESS--OFTEN DROPS THINGS    Family History  Problem Relation Age of Onset   Breast cancer Mother        stage IV at diagnosis   Emphysema Mother        smoked   Heart disease Mother    COPD Father        smoked   Asthma Father    Heart disease Father    Cancer Brother        Sinus    Past Surgical History:  Procedure Laterality Date   BREAST LUMPECTOMY Right 06/2018   BREAST LUMPECTOMY WITH RADIOACTIVE SEED AND SENTINEL LYMPH NODE BIOPSY Right 07/19/2018   Procedure: BREAST LUMPECTOMY WITH RADIOACTIVE SEED AND SENTINEL LYMPH NODE BIOPSY;  Surgeon: Alphonsa Overall, MD;  Location: [facility];  Service: General;  Laterality: Right;   CARDIAC CATHETERIZATION     CARDIAC VALVE REPLACEMENT     2017   CARPAL TUNNEL RELEASE     Bilateral   CHOLECYSTECTOMY     CORONARY ANGIOPLASTY     2010 has stent in place   CYSTOSCOPY W/ RETROGRADES Right 09/21/2013   Procedure: CYSTOSCOPY WITH RIGHT RETROGRADE PYELOGRAM RIGHT DOUBLE J STENT ;  Surgeon: Fredricka Bonine, MD;  Location: [facility];  Service: Urology;  Laterality: Right;   CYSTOSCOPY WITH URETEROSCOPY AND STENT PLACEMENT Right 10/25/2013   Procedure: CYSTOSCOPY RIGHT URETEROSCOPY HOLMIUM LASER LITHO AND STENT PLACEMENT;  Surgeon: Fredricka Bonine, MD;  Location: [facility];  Service: Urology;  Laterality: Right;   HERNIA REPAIR     INTRAOPERATIVE TRANSESOPHAGEAL ECHOCARDIOGRAM N/A 12/12/2014   Procedure: INTRAOPERATIVE TRANSESOPHAGEAL ECHOCARDIOGRAM;  Surgeon: Burnell Blanks, MD;  Location: [facility];  Service: Cardiovascular;  Laterality: N/A;   JOINT REPLACEMENT     bil total knees   KNEE ARTHROPLASTY  02/03/2012   Procedure: COMPUTER ASSISTED TOTAL KNEE ARTHROPLASTY;  Surgeon: Mcarthur Rossetti, MD;  Location: [place];  Service: Orthopedics;  Laterality: Right;  Right total knee arthroplasty   LEFT AND RIGHT HEART CATHETERIZATION WITH CORONARY  ANGIOGRAM N/A 03/17/2013   Procedure: LEFT AND RIGHT HEART CATHETERIZATION WITH CORONARY ANGIOGRAM;  Surgeon: Burnell Blanks, MD;  Location: [facility] CATH LAB;  Service: Cardiovascular;  Laterality: N/A;   LEFT AND RIGHT HEART CATHETERIZATION WITH CORONARY/GRAFT ANGIOGRAM N/A 09/14/2014   Procedure: LEFT AND RIGHT HEART CATHETERIZATION WITH Beatrix Fetters;  Surgeon: Burnell Blanks, MD;  Location: [facility] C[facility] LAB;  Service: Cardiovascular;  Laterality: N/A;   TEE WITHOUT CARDIOVERSION N/A 03/14/2013   Procedure: TRANSESOPHAGEAL ECHOCARDIOGRAM (TEE);  Surgeon: Lelon Perla, MD;  Location: [facility] ENDOSCOPY;  Service: Cardiovascular;  Laterality: N/A;   TEE WITHOUT CARDIOVERSION N/A 11/14/2014   Procedure: TRANSESOPHAGEAL ECHOCARDIOGRAM (TEE);  Surgeon: Lelon Perla, MD;  Location: [facility] ENDOSCOPY;  Service: Cardiovascular;  Laterality: N/A;   TONSILLECTOMY     maybe as a child- does not know   TOTAL KNEE ARTHROPLASTY  09/10/2012   Procedure: TOTAL KNEE ARTHROPLASTY;  Surgeon: Mcarthur Rossetti, MD;  [facility];  Service: Orthopedics;  Laterality: Left;   TOTAL KNEE REVISION Right 07/15/2013   Procedure: REVISION ARTHROPLASTY RIGHT KNEE;  Surgeon: Mcarthur Rossetti, MD;  [facility];  Service: Orthopedics;  Laterality: Right;   TRANSCATHETER AORTIC VALVE REPLACEMENT, TRANSFEMORAL N/A 12/12/2014   Procedure: TRANSCATHETER AORTIC VALVE REPLACEMENT, TRANSFEMORAL;  Surgeon: Burnell Blanks, MD;  Location: [place];  Service: Cardiovascular;  Laterality: N/A;   TRIGGER FINGER RELEASE  09/10/2012   Procedure: RELEASE TRIGGER FINGER/A-1 PULLEY;  Surgeon: Mcarthur Rossetti, MD;  Location[facility];  Service: Orthopedics;  Laterality: Right;  Right Ring Finger   TUBAL LIGATION     Social History   Occupational History   Occupation: Disabled  Tobacco Use   Smoking status: Former    Packs/day: 1.50    Years: 30.00    Pack years: 45.00    Types: Cigarettes    Quit  date: 12/29/2000    Years since quitting: 20.5   Smokeless tobacco: Never  Vaping Use   Vaping Use: Never used  Substance and Sexual Activity   Alcohol use: Not Currently   Drug use: Not Currently   Sexual activity: Not Currently    Birth control/protection: Surgical

## 2021-07-14 ENCOUNTER — Other Ambulatory Visit: Payer: Self-pay | Admitting: Internal Medicine

## 2021-07-14 DIAGNOSIS — I2510 Atherosclerotic heart disease of native coronary artery without angina pectoris: Secondary | ICD-10-CM

## 2021-07-14 DIAGNOSIS — I350 Nonrheumatic aortic (valve) stenosis: Secondary | ICD-10-CM

## 2021-07-16 ENCOUNTER — Other Ambulatory Visit: Payer: Medicaid Other

## 2021-07-16 ENCOUNTER — Ambulatory Visit: Payer: Medicaid Other | Admitting: Oncology

## 2021-07-18 ENCOUNTER — Other Ambulatory Visit: Payer: Self-pay | Admitting: General Surgery

## 2021-07-18 ENCOUNTER — Other Ambulatory Visit: Payer: Self-pay | Admitting: Specialist

## 2021-07-18 DIAGNOSIS — Z853 Personal history of malignant neoplasm of breast: Secondary | ICD-10-CM

## 2021-07-18 NOTE — Telephone Encounter (Signed)
Pt called stating she is still in a lot of pain and would like a refill on her hydrocodone rx; pt would like a CB when this has been sent please.   [phone]

## 2021-07-19 ENCOUNTER — Other Ambulatory Visit: Payer: Self-pay | Admitting: Surgery

## 2021-07-19 MED ORDER — TRAMADOL HCL 50 MG PO TABS: 50.0000 mg | ORAL_TABLET | Freq: Three times a day (TID) | ORAL | 0 refills | Status: DC | PRN

## 2021-07-20 ENCOUNTER — Other Ambulatory Visit: Payer: Self-pay | Admitting: Student

## 2021-07-20 DIAGNOSIS — E119 Type 2 diabetes mellitus without complications: Secondary | ICD-10-CM

## 2021-07-21 ENCOUNTER — Ambulatory Visit
Admission: RE | Admit: 2021-07-21 | Discharge: 2021-07-21 | Disposition: A | Discharge status: Home / Self Care | Payer: Medicaid Other | Source: Ambulatory Visit | Attending: Specialist | Admitting: Specialist

## 2021-07-21 DIAGNOSIS — M19012 Primary osteoarthritis, left shoulder: Secondary | ICD-10-CM | POA: Diagnosis not present

## 2021-07-21 DIAGNOSIS — M7542 Impingement syndrome of left shoulder: Secondary | ICD-10-CM

## 2021-07-21 DIAGNOSIS — R600 Localized edema: Secondary | ICD-10-CM | POA: Diagnosis not present

## 2021-07-21 DIAGNOSIS — M65812 Other synovitis and tenosynovitis, left shoulder: Secondary | ICD-10-CM | POA: Diagnosis not present

## 2021-07-21 DIAGNOSIS — R29898 Other symptoms and signs involving the musculoskeletal system: Secondary | ICD-10-CM

## 2021-07-21 DIAGNOSIS — R200 Anesthesia of skin: Secondary | ICD-10-CM | POA: Diagnosis not present

## 2021-07-21 IMAGING — MR MR SHOULDER*L* W/O CM
4 of 5 series · 22 of 40 positions shown · non-contrast
Comparison: Plain films left shoulder [DATE].

CLINICAL DATA: Left shoulder pain and numbness.  No known injury.

EXAM:
MRI OF THE LEFT SHOULDER WITHOUT CONTRAST
TECHNIQUE: Multiplanar, multisequence MR imaging of the shoulder was performed.
No intravenous contrast was administered.

[Series 6: T2 fat-sat · axial · left · 3.0mm · 0.59mm/px · z∈[-103,-6]mm · 8 of 30 slices shown (1 of 3)]
[im 1/30]
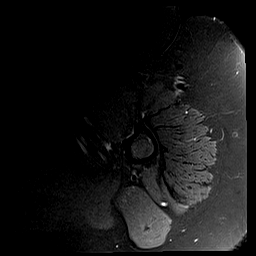
[im 4/30]
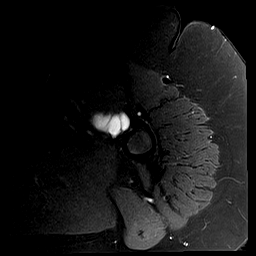
[im 10/30]
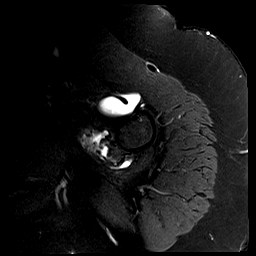
[im 13/30]
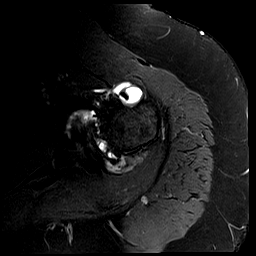
[im 17/30]
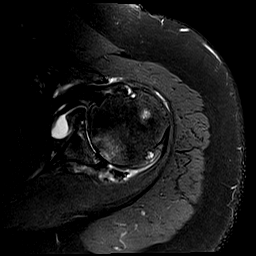
[im 20/30]
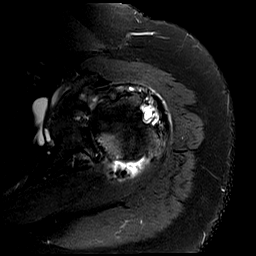
[im 26/30]
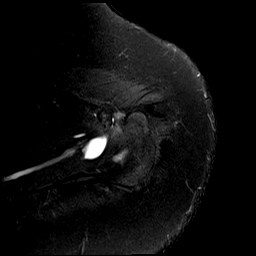
[im 30/30]
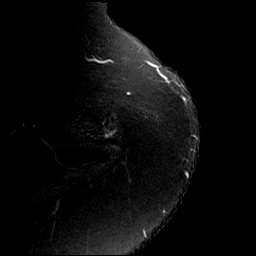

[Series 7: T2 fat-sat · coronal · left · 4.0mm · 0.27mm/px · 4 of 21 slices shown (2 of 3)]
[im 1/21]
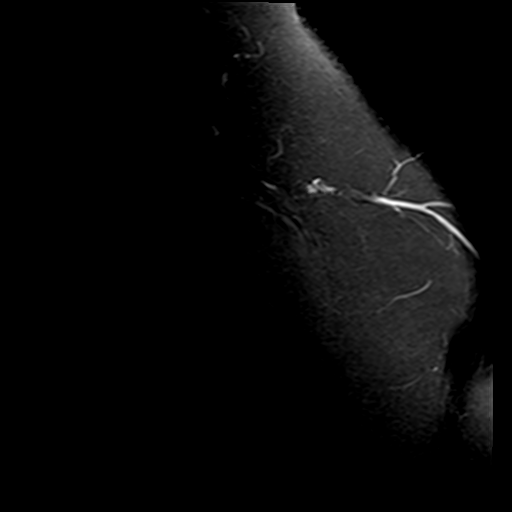
[im 4/21]
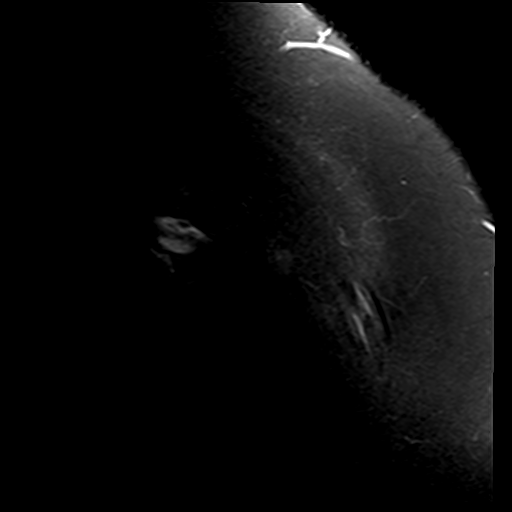
[im 11/21]
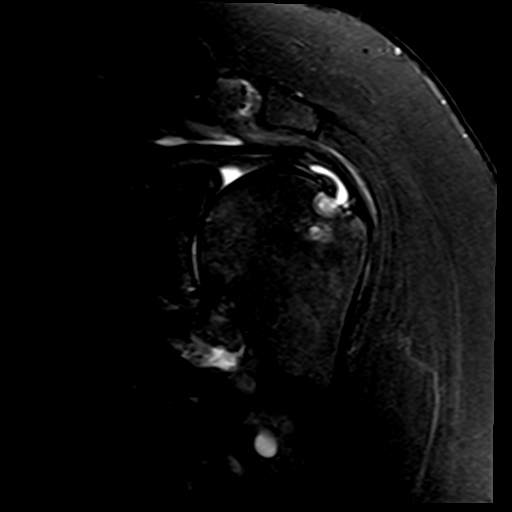
[im 17/21]
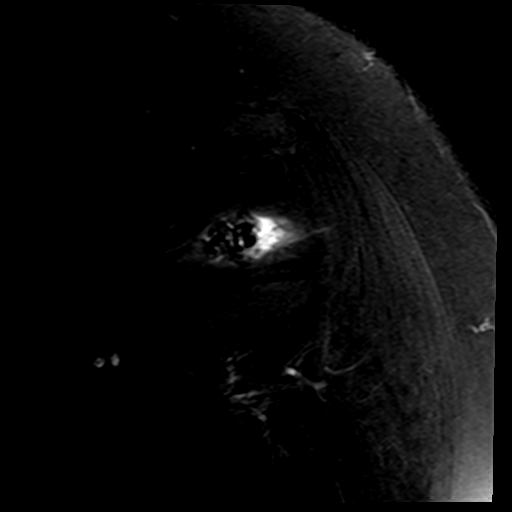

[Series 8: PD · coronal · left · 4.0mm · 0.22mm/px · 7 of 21 slices shown]
[im 1/21]
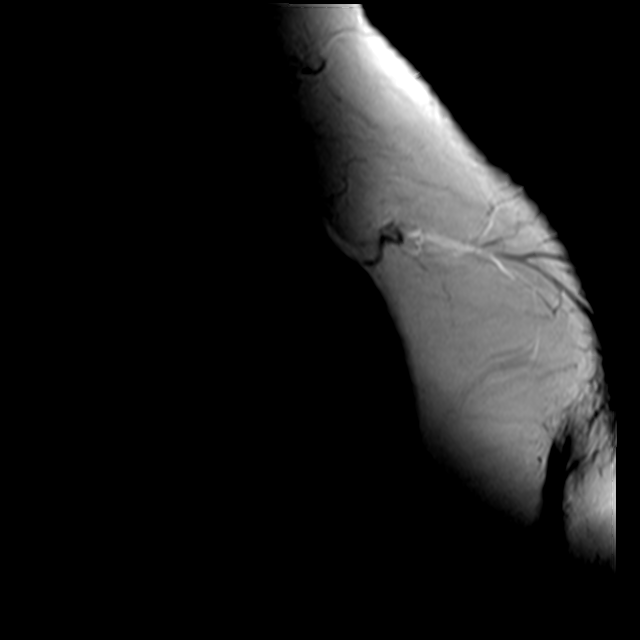
[im 4/21]
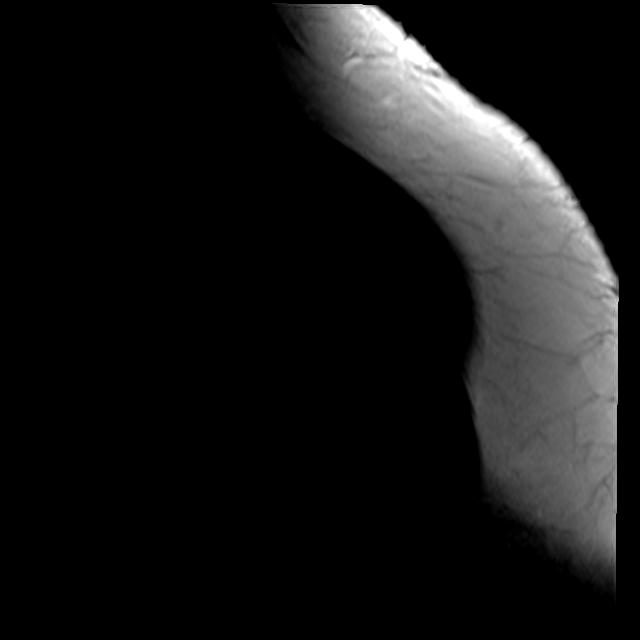
[im 7/21]
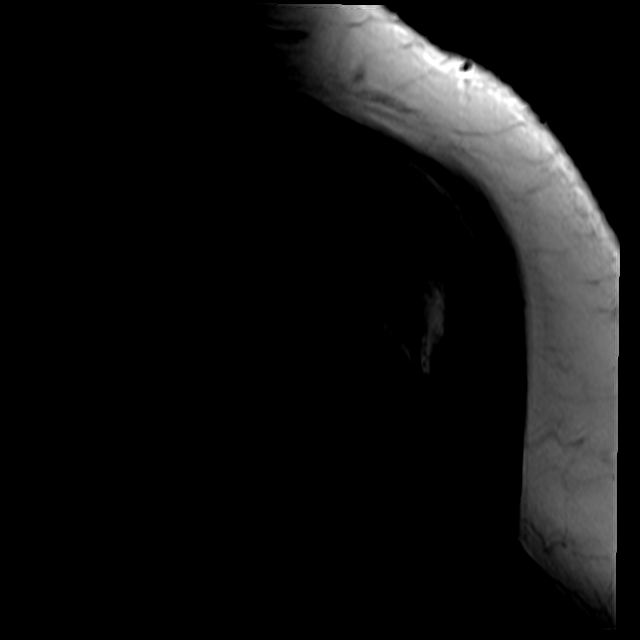
[im 11/21]
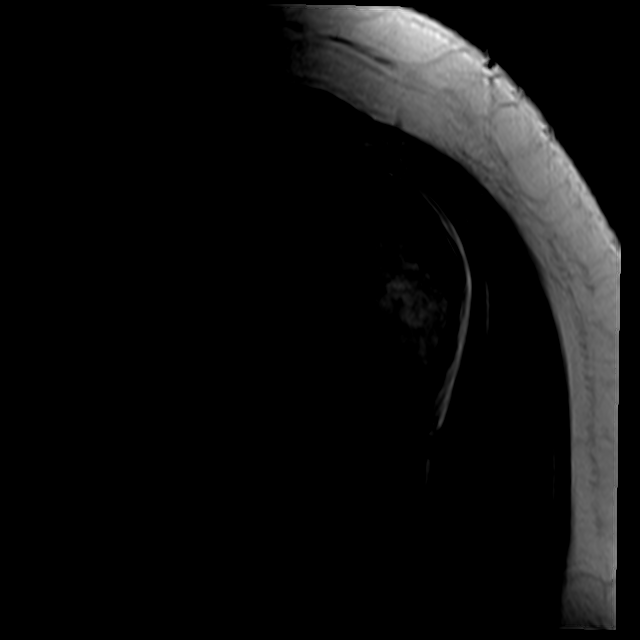
[im 14/21]
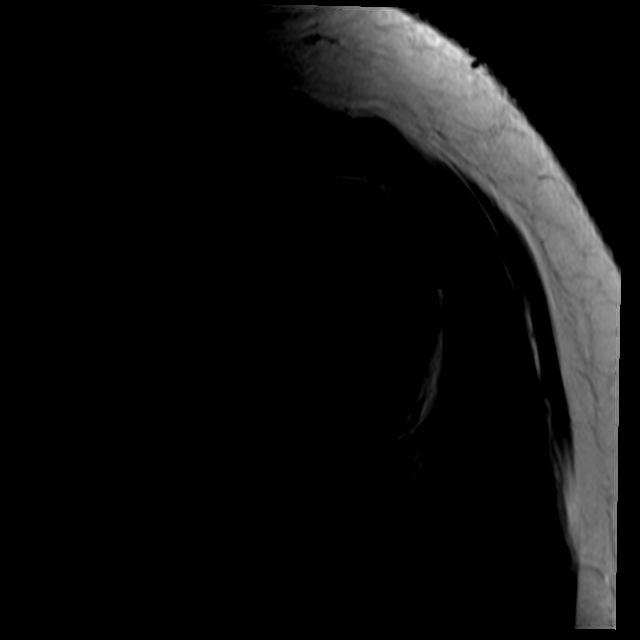
[im 17/21]
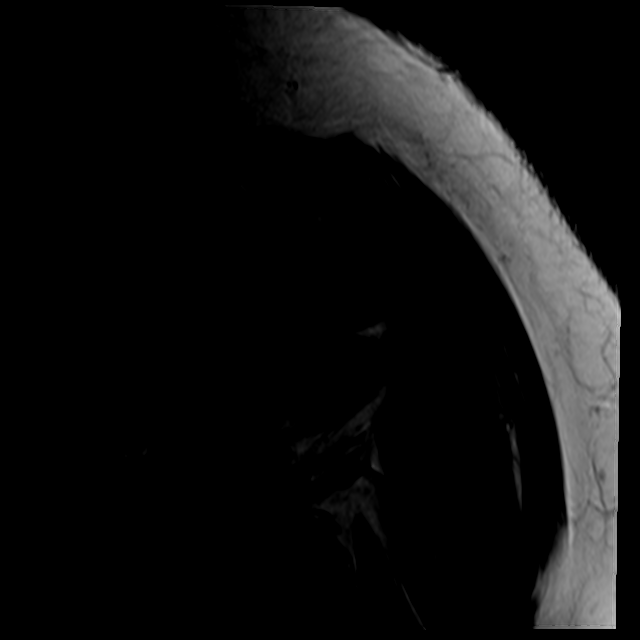
[im 21/21]
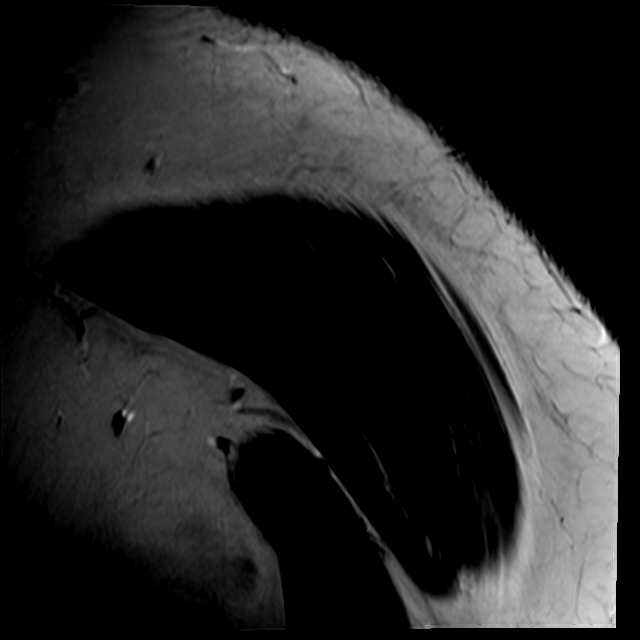

[Series 9: T2 fat-sat · oblique · left · 4.0mm · 0.44mm/px · 3 of 26 slices shown (3 of 3)]
[im 4/26]
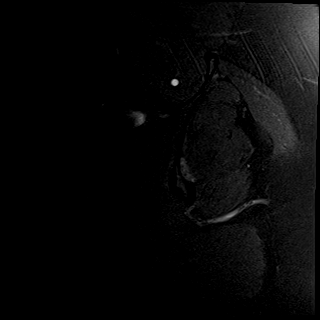
[im 15/26]
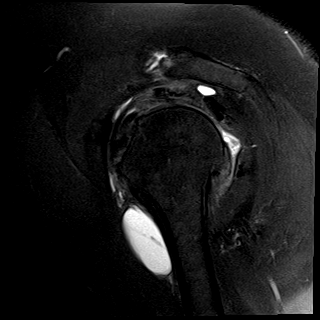
[im 22/26]
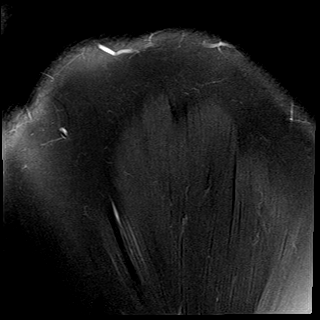

[22 of 40 positions shown; findings below may reference images not displayed]

FINDINGS: Rotator cuff: The patient has rotator cuff tendinopathy. No tear is
identified but there is a small fissure in the conjoined
supraspinatus and infraspinatus at the greater tuberosity.

Muscles: No atrophy. At least 2 ganglion cysts are seen in the
supraspinatus. The more peripheral is approximately 1.4 cm in
diameter. A more medial cyst is incompletely imaged but at least 2
cm in diameter.

Biceps long head: Intact. There is a large volume of fluid in the
sheath of the tendon in the bicipital groove. More focal appearing
fluid measuring approximately 5.5 cm craniocaudal by 2.4 cm
transverse by 1.7 cm AP is seen in the bicipital groove. The biceps
tendon is peripherally displaced by the fluid or adhesed to the
periphery of the sheath.

Acromioclavicular Joint: Moderate osteoarthritis. Type 2 acromion.
Small volume of subacromial/subdeltoid bursa.

Glenohumeral Joint: Severe degenerative change is present. Cartilage
is completely denuded. There is subchondral edema and small
subchondral cysts about the joint. Large osteophyte is seen off the
humeral head. A 2 cm in diameter loose body is in the posterior
aspect of the joint.

Labrum: The anterior and posterior labrum are severely degenerated
and torn.

Bones:  No fracture or worrisome lesion.

Other: None.
IMPRESSION: Dominant finding is severe glenohumeral osteoarthritis. 2 cm loose
body in the posterior aspect of the joint is noted. There is
associated degenerative tearing of the anterior and posterior
labrum.

Tenosynovitis of the long head of biceps. As described above, focal
appearing fluid is seen in the sheath in the bicipital groove. The
biceps tendon is either peripherally displaced by the fluid or
adhesed to the periphery of the sheath.

Rotator cuff tendinopathy. There is a small fissure in the conjoined
supraspinatus and infraspinatus.

## 2021-07-22 ENCOUNTER — Telehealth: Payer: Self-pay | Admitting: Specialist

## 2021-07-22 NOTE — Telephone Encounter (Signed)
I called her and advised that the questions she has would need to be addressed at the MRI review appt, I did advised that I will put her on the cancellation list and call if someone cancels

## 2021-07-22 NOTE — Telephone Encounter (Signed)
Pt calling wanting to ask the nurse a question about something on her mychart about the MRI that he had completed yesterday. I believe the question is about understanding the MRI which she will have to do in the appt with Nitka but not sure that was understood by pt. Pt sch'd a follow up with Nitka for her post mri while she was on the phone. The best call back number is [phone].

## 2021-07-23 ENCOUNTER — Other Ambulatory Visit: Payer: Self-pay | Admitting: General Surgery

## 2021-07-23 ENCOUNTER — Ambulatory Visit
Admission: RE | Admit: 2021-07-23 | Discharge: 2021-07-23 | Disposition: A | Discharge status: Home / Self Care | Payer: Medicaid Other | Source: Ambulatory Visit | Attending: General Surgery | Admitting: General Surgery

## 2021-07-23 ENCOUNTER — Other Ambulatory Visit: Payer: Self-pay

## 2021-07-23 DIAGNOSIS — Z853 Personal history of malignant neoplasm of breast: Secondary | ICD-10-CM

## 2021-07-23 DIAGNOSIS — R921 Mammographic calcification found on diagnostic imaging of breast: Secondary | ICD-10-CM

## 2021-07-23 HISTORY — DX: Personal history of irradiation: Z92.3

## 2021-07-23 IMAGING — MG DIGITAL DIAGNOSTIC BILAT W/ TOMO W/ CAD
8 of 17 series · 8 of 40 positions shown · non-contrast
Comparison: Previous exam(s).

CLINICAL DATA: 59-year-old underwent malignant lumpectomy of the
UPPER OUTER QUADRANT of the RIGHT breast in [7X] with adjuvant
radiation therapy. Annual evaluation.

EXAM:
DIGITAL DIAGNOSTIC BILATERAL MAMMOGRAM WITH TOMOSYNTHESIS AND CAD
TECHNIQUE: Bilateral digital diagnostic mammography and breast tomosynthesis
was performed. The images were evaluated with computer-aided
detection.

[R MLO]
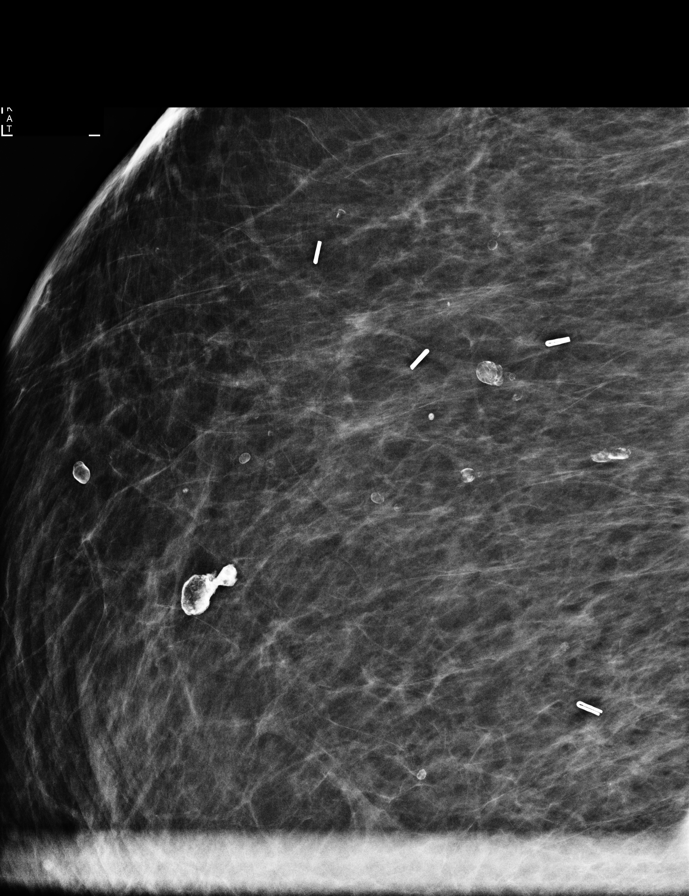

[L ML (1 of 2)]
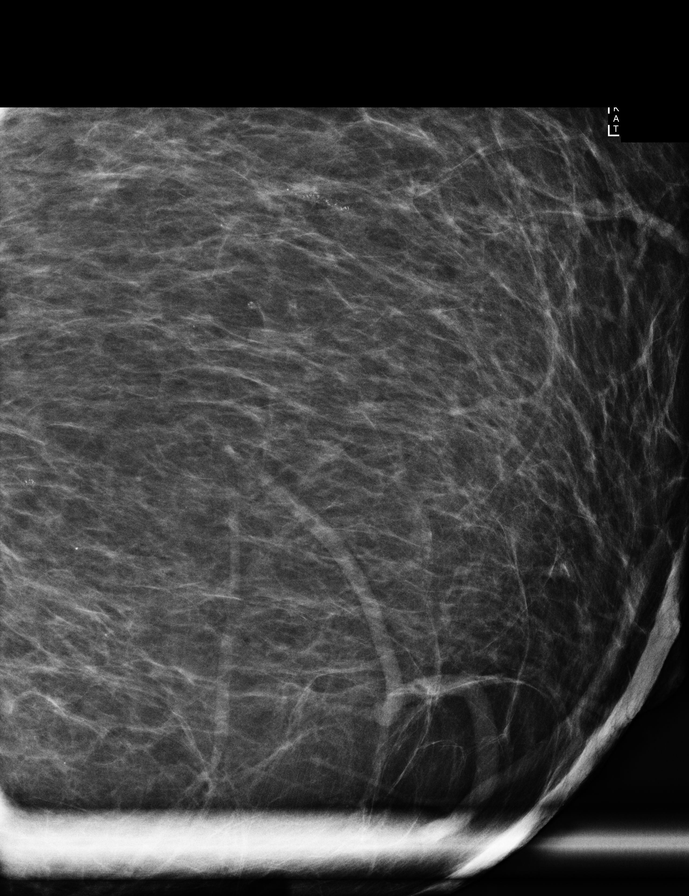

[L ML (2 of 2)]
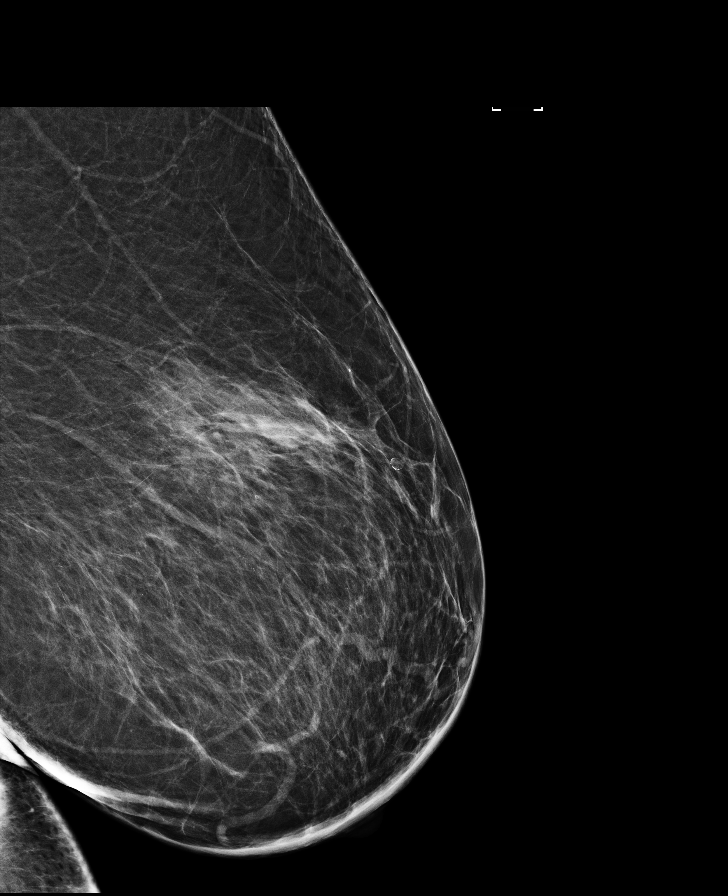

[L MLO synth-2D (1 of 2)]
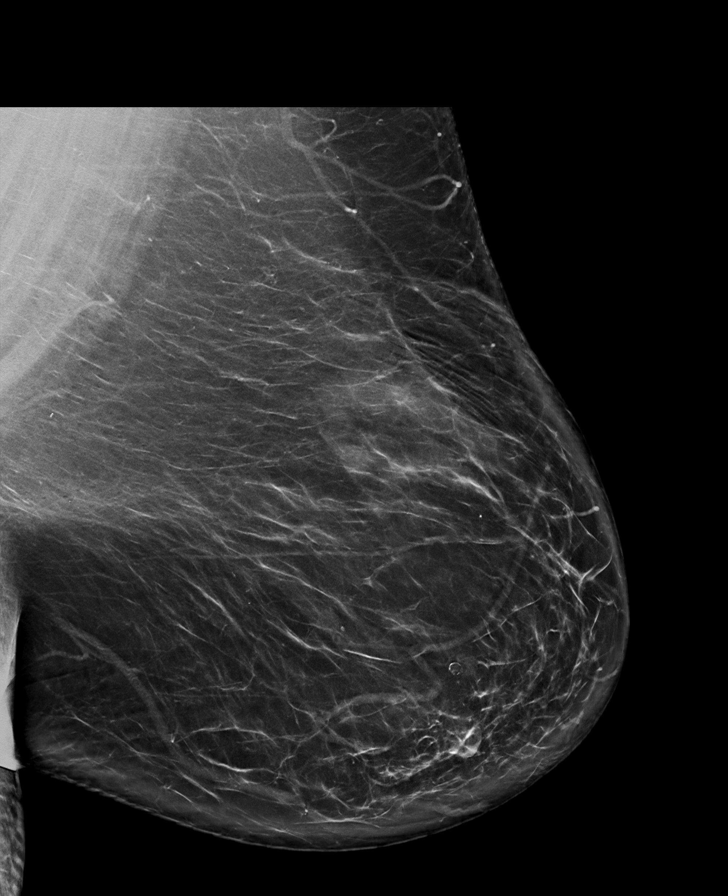

[L CC synth-2D (1 of 2)]
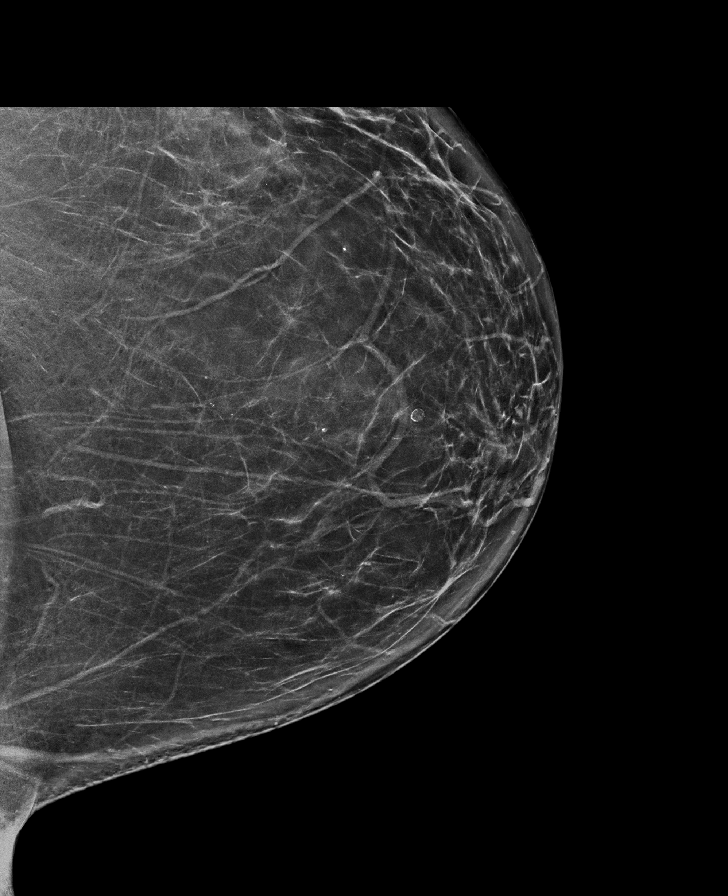

[L MLO synth-2D (2 of 2)]
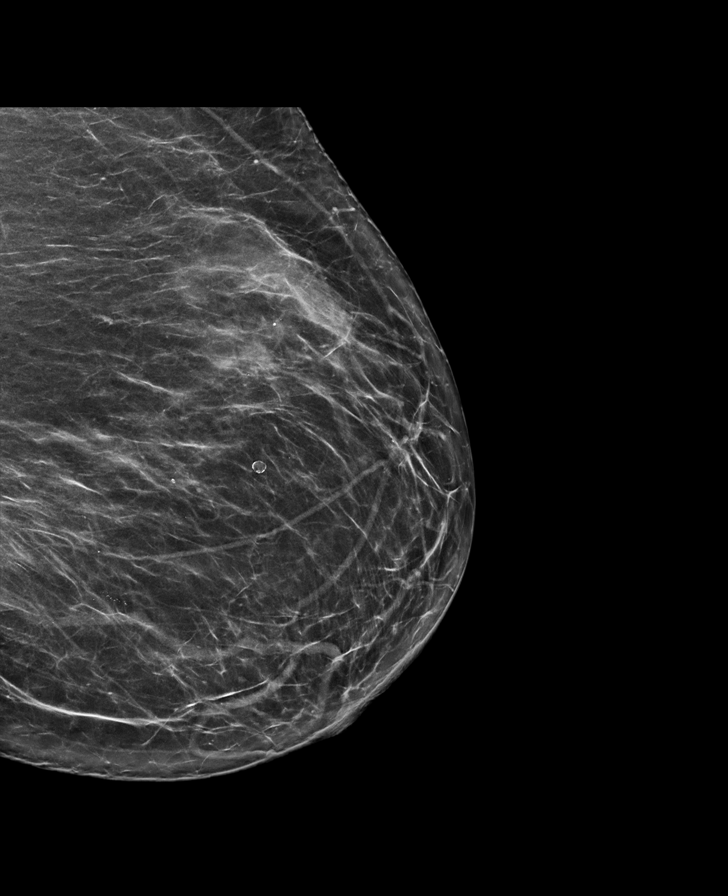

[R MLO synth-2D]
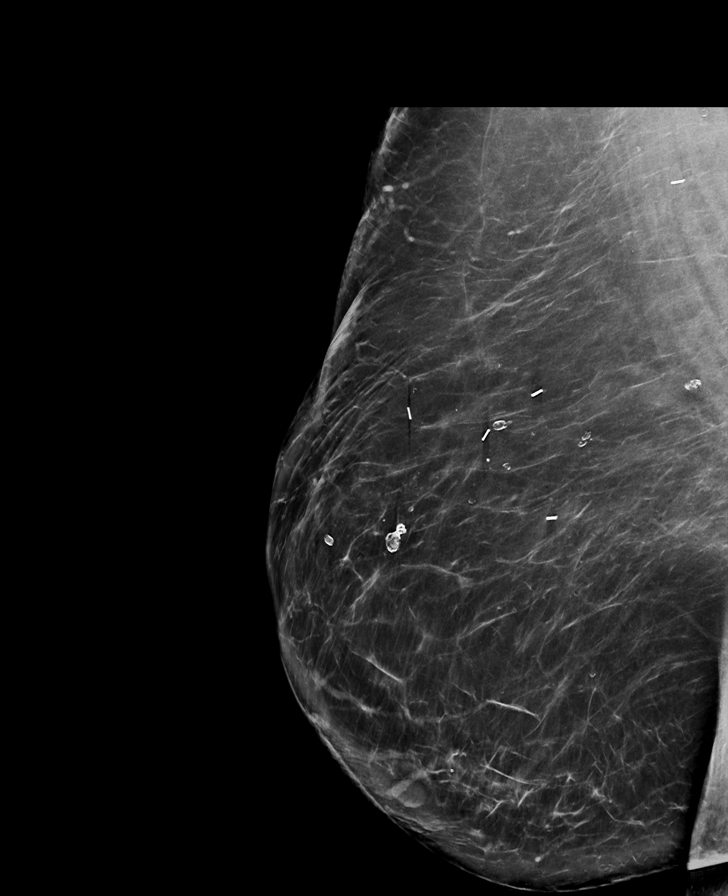

[L CC synth-2D (2 of 2)]
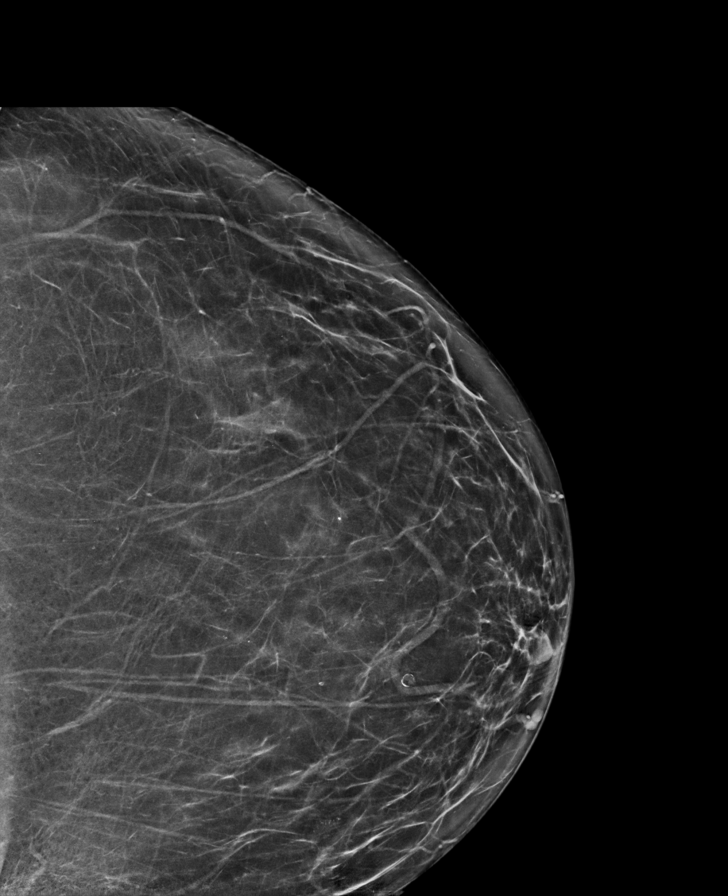

[8 of 40 positions shown; findings below may reference images not displayed]

ACR Breast Density Category b: There are scattered areas of
fibroglandular density.
FINDINGS: Full field CC and MLO views of both breasts, a spot magnification
MLO view of the lumpectomy site in the RIGHT breast, spot
magnification CC and mediolateral views of LEFT breast
calcifications and a full field mediolateral view of the LEFT breast
were obtained.

RIGHT: No findings suspicious for malignancy. Mild post lumpectomy
scar/architectural distortion in the UPPER OUTER QUADRANT middle
depth.

LEFT: Fine heterogeneous calcifications in the inner breast at
middle depth in a linear orientation directed toward the nipple
spanning 1.0 cm. No suspicious findings elsewhere.
IMPRESSION: 1. Indeterminate 1.0 cm group of calcifications involving the inner
LEFT breast at middle depth.
2. Expected post lumpectomy changes involving the RIGHT breast.

RECOMMENDATION:
Stereotactic tomosynthesis core needle biopsy of the LEFT breast
calcifications.

The stereotactic tomosynthesis core needle biopsy procedure was
discussed with patient and her questions were answered. She wishes
to proceed and the biopsy has been scheduled by the [REDACTED] staff.

I have discussed the findings and recommendations with the patient.

BI-RADS CATEGORY  4: Suspicious.

## 2021-07-29 DIAGNOSIS — Z419 Encounter for procedure for purposes other than remedying health state, unspecified: Secondary | ICD-10-CM | POA: Diagnosis not present

## 2021-07-30 ENCOUNTER — Other Ambulatory Visit: Payer: Self-pay

## 2021-07-30 ENCOUNTER — Inpatient Hospital Stay: Payer: Medicaid Other | Attending: Oncology

## 2021-07-30 ENCOUNTER — Inpatient Hospital Stay (HOSPITAL_BASED_OUTPATIENT_CLINIC_OR_DEPARTMENT_OTHER): Payer: Medicaid Other | Admitting: Oncology

## 2021-07-30 VITALS — BP 137/60 | HR 88 | Temp 98.1°F | Resp 20 | Ht 63.0 in | Wt 308.2 lb

## 2021-07-30 DIAGNOSIS — C50411 Malignant neoplasm of upper-outer quadrant of right female breast: Secondary | ICD-10-CM | POA: Insufficient documentation

## 2021-07-30 DIAGNOSIS — Z79811 Long term (current) use of aromatase inhibitors: Secondary | ICD-10-CM | POA: Insufficient documentation

## 2021-07-30 DIAGNOSIS — E119 Type 2 diabetes mellitus without complications: Secondary | ICD-10-CM | POA: Diagnosis not present

## 2021-07-30 DIAGNOSIS — Z923 Personal history of irradiation: Secondary | ICD-10-CM | POA: Diagnosis not present

## 2021-07-30 DIAGNOSIS — Z170 Estrogen receptor positive status [ER+]: Secondary | ICD-10-CM | POA: Insufficient documentation

## 2021-07-30 DIAGNOSIS — Z86711 Personal history of pulmonary embolism: Secondary | ICD-10-CM | POA: Diagnosis not present

## 2021-07-30 DIAGNOSIS — G2581 Restless legs syndrome: Secondary | ICD-10-CM | POA: Insufficient documentation

## 2021-07-30 DIAGNOSIS — Z1239 Encounter for other screening for malignant neoplasm of breast: Secondary | ICD-10-CM

## 2021-07-30 DIAGNOSIS — Z794 Long term (current) use of insulin: Secondary | ICD-10-CM | POA: Insufficient documentation

## 2021-07-30 DIAGNOSIS — I110 Hypertensive heart disease with heart failure: Secondary | ICD-10-CM | POA: Insufficient documentation

## 2021-07-30 DIAGNOSIS — Z96653 Presence of artificial knee joint, bilateral: Secondary | ICD-10-CM | POA: Insufficient documentation

## 2021-07-30 DIAGNOSIS — G4733 Obstructive sleep apnea (adult) (pediatric): Secondary | ICD-10-CM

## 2021-07-30 DIAGNOSIS — Z7982 Long term (current) use of aspirin: Secondary | ICD-10-CM | POA: Insufficient documentation

## 2021-07-30 DIAGNOSIS — I252 Old myocardial infarction: Secondary | ICD-10-CM | POA: Insufficient documentation

## 2021-07-30 DIAGNOSIS — I5032 Chronic diastolic (congestive) heart failure: Secondary | ICD-10-CM | POA: Diagnosis not present

## 2021-07-30 DIAGNOSIS — E785 Hyperlipidemia, unspecified: Secondary | ICD-10-CM | POA: Insufficient documentation

## 2021-07-30 DIAGNOSIS — D649 Anemia, unspecified: Secondary | ICD-10-CM

## 2021-07-30 DIAGNOSIS — Z79899 Other long term (current) drug therapy: Secondary | ICD-10-CM | POA: Insufficient documentation

## 2021-07-30 LAB — CBC WITH DIFFERENTIAL/PLATELET
Abs Immature Granulocytes: 0.02 10*3/uL (ref 0.00–0.07)
Basophils Absolute: 0.1 10*3/uL (ref 0.0–0.1)
Basophils Relative: 1 %
Eosinophils Absolute: 0.4 10*3/uL (ref 0.0–0.5)
Eosinophils Relative: 5 %
HCT: 42 % (ref 36.0–46.0)
Hemoglobin: 13.9 g/dL (ref 12.0–15.0)
Immature Granulocytes: 0 %
Lymphocytes Relative: 20 %
Lymphs Abs: 1.6 10*3/uL (ref 0.7–4.0)
MCH: 28.5 pg (ref 26.0–34.0)
MCHC: 33.1 g/dL (ref 30.0–36.0)
MCV: 86.2 fL (ref 80.0–100.0)
Monocytes Absolute: 0.5 10*3/uL (ref 0.1–1.0)
Monocytes Relative: 7 %
Neutro Abs: 5.5 10*3/uL (ref 1.7–7.7)
Neutrophils Relative %: 67 %
Platelets: 217 10*3/uL (ref 150–400)
RBC: 4.87 MIL/uL (ref 3.87–5.11)
RDW: 13.2 % (ref 11.5–15.5)
WBC: 8.2 10*3/uL (ref 4.0–10.5)
nRBC: 0 % (ref 0.0–0.2)

## 2021-07-30 LAB — COMPREHENSIVE METABOLIC PANEL
ALT: 17 U/L (ref 0–44)
AST: 16 U/L (ref 15–41)
Albumin: 3.8 g/dL (ref 3.5–5.0)
Alkaline Phosphatase: 92 U/L (ref 38–126)
Anion gap: 10 (ref 5–15)
BUN: 17 mg/dL (ref 8–23)
CO2: 28 mmol/L (ref 22–32)
Calcium: 10.1 mg/dL (ref 8.9–10.3)
Chloride: 99 mmol/L (ref 98–111)
Creatinine, Ser: 0.91 mg/dL (ref 0.44–1.00)
GFR, Estimated: >60 mL/min (ref >60)
Glucose, Bld: 177 mg/dL — ABNORMAL HIGH (ref 70–99)
Potassium: 4.4 mmol/L (ref 3.5–5.1)
Sodium: 137 mmol/L (ref 135–145)
Total Bilirubin: 1.3 mg/dL — ABNORMAL HIGH (ref 0.3–1.2)
Total Protein: 7.5 g/dL (ref 6.5–8.1)

## 2021-07-30 NOTE — Progress Notes (Addendum)
[facility]  Telephone:(336) [phone] Fax:(336) [phone]   ID: [name] DOB: 11/01/1961  MR#: [id number]  KDX#:[id number]  Patient Care Team: [name] Dame, MD as PCP - General (Internal Medicine) [name] Blanks, MD as PCP - Cardiology (Cardiology) [name], [name] Dad, MD as Consulting Physician (Oncology) [name] Rudd, MD as Consulting Physician (Radiation Oncology) [name] Blanks, MD as Consulting Physician (Cardiology) [name], [name] Massed, NP as Nurse Practitioner (Hematology and Oncology) [name] Bookbinder, MD as Consulting Physician (General Surgery) OTHER MD:   CHIEF COMPLAINT: Estrogen receptor positive breast cancer  CURRENT TREATMENT: Exemestane   INTERVAL HISTORY: [name] returns today for follow-up of her estrogen receptor positive breast cancer.   She continues on exemestane.  She has "about average" hot flashes meaning this is what she would expect from menopause alone.  She also has significant vaginal dryness issues.  She obtains the drug at no cost.  Since her last visit, she underwent bilateral diagnostic mammography with tomography at [facility] on 07/23/2021 showing: breast density category B; indeterminate 1 cm group\ of calcifications involving inner left breast; expected post-lumpectomy changes involving right breast.  She is scheduled for biopsy of the left breast calcifications on 08/01/2021.  She also had a left shoulder MRI without contrast ordered by Dr. Louanne [name] which showed severe glenohumeral osteoarthritis and tendinopathy.  REVIEW OF SYSTEMS: [name] is not aware of having injured her shoulder in any way.  She does have problems with insomnia.  She takes either melatonin or Benadryl for this and that usually takes care of the problem.  She does chair exercises or chair dancing for exercise.  A detailed review of systems today was otherwise stable   COVID 19 VACCINATION STATUS: [place] x2, status  post booster x1 as of August 2022   HISTORY OF CURRENT ILLNESS: On the original intake note:  [name] had routine screening mammography on 05/27/2018 showing a possible abnormality in the right breast. She underwent unilateral right  diagnostic mammography with tomography and right breast ultrasonography [facility] on 06/02/2018 showing: breast density category B. An area of distortion is identified in the upper outer quadrant of the right breast mammographically. Ultrasonography of this area found an irregular hypoechoic mass at the 11 o'clock upper outer quadrant measuring 1.8 x 1.8 x 1.4 cm and located 6 cm from the nipple. The right axilla is negative sonographically for abnormal lymph nodes.   Accordingly on 06/04/2018 she proceeded to biopsy of the right breast area in question. The pathology from this procedure showed ([id number]): Invasive ductal carcinoma grade 1 measuring 1.0 cm in greatest extent, with microcalcifications. Ductal carcinoma in situ high grade. Prognostic indicators significant for: estrogen receptor, 100% positive and progesterone receptor, 100% positive, both with strong staining intensity. Proliferation marker Ki67 at 8%. HER2  not amplified with ratios HER2/CEP17 signals 1.32 and average HER2 copies per cell 1.85  The patient's subsequent history is as detailed below.   PAST MEDICAL HISTORY: Past Medical History:  Diagnosis Date   Anemia    Aortic valve stenosis, severe    Arthritis    PAIN AND SEVERE OA LEFT KNEE ; S/P RIGHT TKA ON 02/03/12; HAS LOWER BACK PAIN-UNABLE TO STAND MORE THAN 10 MIN; ARTHRITIS "ALL OVER"   Asthma    Blood transfusion    2013[facility]   Breast cancer in female [facility])    Right   CAD (coronary artery disease)    Cath 2010 with DES x 1 RCA-- PT'S CARDIOLOGIST  IS DR. Angelena [name]   Chronic diastolic congestive heart failure (HCC)    COPD (chronic obstructive pulmonary disease) ([place])    Depression    Diabetes mellitus  DIAGNOSED IN2010   Controll s with diet   Dyspnea    with much ambulation   Eczema    on back   Headache    migraines younger- rare now 02/07/21   Heart murmur    History of hiatal hernia    History of kidney stones    passed or blasted   Hyperlipidemia    Hypertension    Morbid obesity with body mass index of 60.0-69.9 in adult [facility])    Myocardial infarction ([place])    PT THINKS SHE WAS DX WITH MI AT THE TIME OF HEART STENTING   Neuromuscular disorder ([place])    bilateral hands   Personal history of radiation therapy    Pneumonia    Pulmonary embolism ([place]) 02/08/2012   S/P RT TOTAL KNEE ON 02/03/12--ON 02/08/12--DEVELOPED ACUTE SOB AND CHEST PAIN--AND DIAGNOSED WITH  PULMONARY EMBOLUS AND PNEUMONIA   Restless leg syndrome    Sleep apnea    uses 3 liters O2 at night    Uterine fibroid    NO PROBLEMS AT PRESENT FROM THE FIBROIDS-STATES SHE IS POST MENOPAUSAL-LAST MENSES 2010 EXCEPT FOR EPISODE THIS YR OF BLEEDING RELATED TO FIBROIDS.   Weakness    BOTH HANDS - S/P BILATERAL CARPAL TUNNEL RELEASE--BUT STILL HAS WEAKNESS--OFTEN DROPS THINGS  Migraines at younger age. MI in 2009   PAST SURGICAL HISTORY: Past Surgical History:  Procedure Laterality Date   BREAST BIOPSY Right 06/04/2018   BREAST LUMPECTOMY Right 06/2018   BREAST LUMPECTOMY WITH RADIOACTIVE SEED AND SENTINEL LYMPH NODE BIOPSY Right 07/19/2018   Procedure: BREAST LUMPECTOMY WITH RADIOACTIVE SEED AND SENTINEL LYMPH NODE BIOPSY;  Surgeon: [name] Overall, MD;  Location: [facility];  Service: General;  Laterality: Right;   CARDIAC CATHETERIZATION     CARDIAC VALVE REPLACEMENT     2017   CARPAL TUNNEL RELEASE     Bilateral   CHOLECYSTECTOMY     CORONARY ANGIOPLASTY     2010 has stent in place   CYSTOSCOPY W/ RETROGRADES Right 09/21/2013   Procedure: CYSTOSCOPY WITH RIGHT RETROGRADE PYELOGRAM RIGHT DOUBLE J STENT ;  Surgeon: [name] Bonine, MD;  Location: [facility];  Service: Urology;  Laterality: Right;   CYSTOSCOPY  WITH URETEROSCOPY AND STENT PLACEMENT Right 10/25/2013   Procedure: CYSTOSCOPY RIGHT URETEROSCOPY HOLMIUM LASER LITHO AND STENT PLACEMENT;  Surgeon: [name] Bonine, MD;  Location: [facility];  Service: Urology;  Laterality: Right;   HERNIA REPAIR     INTRAOPERATIVE TRANSESOPHAGEAL ECHOCARDIOGRAM N/A 12/12/2014   Procedure: INTRAOPERATIVE TRANSESOPHAGEAL ECHOCARDIOGRAM;  Surgeon: [name] Blanks, MD;  Location: [facility];  Service: Cardiovascular;  Laterality: N/A;   JOINT REPLACEMENT     bil total knees   KNEE ARTHROPLASTY  02/03/2012   Procedure: COMPUTER ASSISTED TOTAL KNEE ARTHROPLASTY;  Surgeon: Mcarthur Rossetti, MD;  Location: [facility];  Service: Orthopedics;  Laterality: Right;  Right total knee arthroplasty   LEFT AND RIGHT HEART CATHETERIZATION WITH CORONARY ANGIOGRAM N/A 03/17/2013   Procedure: LEFT AND RIGHT HEART CATHETERIZATION WITH CORONARY ANGIOGRAM;  Surgeon: [name] Blanks, MD;  Location[facility] LAB;  Service: Cardiovascular;  Laterality: N/A;   LEFT AND RIGHT HEART CATHETERIZATION WITH CORONARY/GRAFT ANGIOGRAM N/A 09/14/2014   Procedure: LEFT AND RIGHT HEART CATHETERIZATION WITH Beatrix Fetters;  Surgeon: [name] Blanks, MD;  Location: [facility] CATH LAB;  Service: Cardiovascular;  Laterality: N/A;   TEE WITHOUT CARDIOVERSION N/A 03/14/2013   Procedure: TRANSESOPHAGEAL ECHOCARDIOGRAM (TEE);  Surgeon: Lelon Perla, MD;  Location: [facility] ENDOSCOPY;  Service: Cardiovascular;  Laterality: N/A;   TEE WITHOUT CARDIOVERSION N/A 11/14/2014   Procedure: TRANSESOPHAGEAL ECHOCARDIOGRAM (TEE);  Surgeon: Lelon Perla, MD;  Location: [facility] ENDOSCOPY;  Service: Cardiovascular;  Laterality: N/A;   TONSILLECTOMY     maybe as a child- does not know   TOTAL KNEE ARTHROPLASTY  09/10/2012   Procedure: TOTAL KNEE ARTHROPLASTY;  Surgeon: Mcarthur Rossetti, MD;  [facility];  Service: Orthopedics;  Laterality: Left;   TOTAL KNEE REVISION Right 07/15/2013    Procedure: REVISION ARTHROPLASTY RIGHT KNEE;  Surgeon: Mcarthur Rossetti, MD;  [facility];  Service: Orthopedics;  Laterality: Right;   TRANSCATHETER AORTIC VALVE REPLACEMENT, TRANSFEMORAL N/A 12/12/2014   Procedure: TRANSCATHETER AORTIC VALVE REPLACEMENT, TRANSFEMORAL;  Surgeon: [name] Blanks, MD;  Location: [facility];  Service: Cardiovascular;  Laterality: N/A;   TRIGGER FINGER RELEASE  09/10/2012   Procedure: RELEASE TRIGGER FINGER/A-1 PULLEY;  Surgeon: Mcarthur Rossetti, MD;  [facility];  Service: Orthopedics;  Laterality: Right;  Right Ring Finger   TUBAL LIGATION    Bovine Mitral Aortic valve surgery 2015   FAMILY HISTORY Family History  Problem Relation Age of Onset   Breast cancer Mother        stage IV at diagnosis   Emphysema Mother        smoked   Heart disease Mother    COPD Father        smoked   Asthma Father    Heart disease Father    Cancer Brother        Sinus  The patient's father died at age 49 due to emphysema and possibly cancer . The patient's mother died at age 70 due to breast cancer with stage IV disease at presentation. The patient has 2 full and 1 half brothers and 1 sister.  One of the patient's brothers had nasopharyngeal cancer stage IV diagnosed at age 69. There was also a paternal great grandfather with prostate cancer. The patient denies a family history of ovarian cancer.    GYNECOLOGIC HISTORY:  Patient's last menstrual period was 02/03/2012. Menarche: 60 years old Age at first live birth: 60 years old She is [place] P2. Her LMP was around age 13. She used oral contraception in her 89'T with no complications.    SOCIAL HISTORY:  Ursula is disabled. Her husband, Arnette Norris is retired from working in Health and safety inspector from [employer], Estate agent, and smoke damage. The patient's son, Christia Reading lives in [place] and works as a Biomedical scientist.  He is getting married in 2022.  The patient's son Legrand Como 30 lives in [place] and is a Dietitian of a [employer].  He just bought a house (August 2022).  The patient has no grandchildren. She attends [employer].     ADVANCED DIRECTIVES: In the absence of any documents to the contrary the patient's husband is her healthcare power of attorney   HEALTH MAINTENANCE: Social History   Tobacco Use   Smoking status: Former    Packs/day: 1.50    Years: 30.00    Pack years: 45.00    Types: Cigarettes    Quit date: 12/29/2000    Years since quitting: 20.5   Smokeless tobacco: Never  Vaping Use   Vaping Use: Never used  Substance Use Topics   Alcohol use: Not Currently  Drug use: Not Currently     Colonoscopy: about 8 year ago  PAP: 10/20/2016 normal   Bone density: no   No Known Allergies  Current Outpatient Medications  Medication Sig Dispense Refill   Accu-Chek FastClix Lancets MISC Check blood sugar two times a day 102 each 9   acetaminophen (TYLENOL) 500 MG tablet Take 1,000 mg by mouth every 6 (six) hours as needed for mild pain.     acetaminophen (TYLENOL) 500 MG tablet Take 1 tablet (500 mg total) by mouth every 6 (six) hours as needed. 100 tablet 0   amlodipine-atorvastatin (CADUET) 5-80 MG tablet Take 1 tablet by mouth daily. 90 tablet 3   Ascorbic Acid (VITAMIN C) 1000 MG tablet Take 1,000 mg by mouth daily.     ASPIRIN ADULT LOW STRENGTH 81 MG EC tablet TAKE 1 Tablet BY MOUTH ONCE DAILY 90 tablet 3   Blood Glucose Monitoring Suppl (ACCU-CHEK GUIDE) w/Device KIT 1 each by Does not apply route 2 (two) times daily. 1 kit 1   clopidogrel (PLAVIX) 75 MG tablet TAKE 1 TABLET BY MOUTH IN THE MORNING 90 tablet 0   docusate sodium (COLACE) 100 MG capsule Take 1 capsule (100 mg total) by mouth 2 (two) times daily. 40 capsule 0   DULoxetine (CYMBALTA) 60 MG capsule Take 1 capsule (60 mg total) by mouth daily. 90 capsule 2   Evolocumab (REPATHA SURECLICK) 194 MG/ML SOAJ INJECT 1 PEN INTO THE SKIN EVERY 14 DAYS 2 mL 11   exemestane (AROMASIN) 25 MG tablet  TAKE 1 TABLET BY MOUTH ONCE DAILY AFTER BREAKFAST 90 tablet 0   ferrous gluconate (FERGON) 324 MG tablet Take 1 tablet (324 mg total) by mouth 2 (two) times daily with a meal. 60 tablet 1   Fluticasone-Umeclidin-Vilant (TRELEGY ELLIPTA) 100-62.5-25 MCG/INH AEPB Inhale 1 puff into the lungs daily. 60 each 2   furosemide (LASIX) 40 MG tablet Take 1 tablet (40 mg total) by mouth as needed for fluid or edema. 180 tablet 1   gabapentin (NEURONTIN) 300 MG capsule TAKE 3 CAPSULES BY MOUTH ONCE DAILY AT BEDTIME 90 capsule 0   glucose blood (ACCU-CHEK GUIDE) test strip Check blood sugar 2 times per day 100 each 9   HYDROcodone-acetaminophen (NORCO/VICODIN) 5-325 MG tablet Take 1 tablet by mouth every 8 (eight) hours as needed for moderate pain. 21 tablet 0   hydrocortisone cream 1 % Apply 1 application topically daily as needed for itching.      Insulin Pen Needle (PEN NEEDLES) 32G X 4 MM MISC 1.8 mg by Does not apply route daily. 300 each 2   liraglutide (VICTOZA) 18 MG/3ML SOPN Inject 1.8 mg into the skin daily. 18 mL 0   losartan-hydrochlorothiazide (HYZAAR) 100-25 MG tablet Take 1 tablet by mouth daily. 90 tablet 3   Melatonin 5 MG CAPS Take 5 mg by mouth at bedtime as needed (insomnia).     metoprolol succinate (TOPROL XL) 50 MG 24 hr tablet Take 1 tablet (50 mg total) by mouth daily. Take with or immediately following a meal. 90 tablet 3   nitroGLYCERIN (NITROSTAT) 0.4 MG SL tablet Place 1 tablet (0.4 mg total) under the tongue every 5 (five) minutes as needed for chest pain. 100 tablet 0   OXYGEN Inhale 3 L into the lungs as needed.     PROAIR HFA 108 (90 Base) MCG/ACT inhaler INHALE 2 PUFFS BY MOUTH EVERY 4 HOURS AS NEEDED FOR COUGH, WHEEZING, AND SHORTNESS OF BREATH 18 g 2  traMADol (ULTRAM) 50 MG tablet Take 1 tablet (50 mg total) by mouth every 8 (eight) hours as needed. 30 tablet 0   No current facility-administered medications for this visit.    OBJECTIVE: white woman who appears stated  age  60:   07/30/21 1301  BP: 137/60  Pulse: 88  Resp: 20  Temp: 98.1 F (36.7 C)  SpO2: 98%      Body mass index is 54.6 kg/m.   Wt Readings from Last 3 Encounters:  07/30/21 (!) 308 lb 3.2 oz (139.8 kg)  07/10/21 (!) 305 lb (138.3 kg)  04/25/21 (!) 305 lb (138.3 kg)     ECOG FS:2 - Symptomatic, <50% confined to bed  Sclerae unicteric, EOMs intact Wearing a mask No cervical or supraclavicular adenopathy Lungs no rales or rhonchi Heart regular rate and rhythm Abd soft, obese, nontender, positive bowel sounds MSK no focal spinal tenderness, no upper extremity lymphedema Neuro: nonfocal, well oriented, appropriate affect Breasts: The right breast has undergone lumpectomy and radiation.  It is slightly smaller than the left but otherwise is unremarkable.  There is no palpable mass and no skin or nipple change of concern.  The left breast and both axillae are benign.   LAB RESULTS:  CMP     Component Value Date/Time   NA 135 02/13/2021 0517   NA 138 10/17/2020 0924   K 3.8 02/13/2021 0517   CL 101 02/13/2021 0517   CO2 27 02/13/2021 0517   GLUCOSE 255 (H) 02/13/2021 0517   BUN 24 (H) 02/13/2021 0517   BUN 13 10/17/2020 0924   CREATININE 0.98 02/13/2021 0517   CREATININE 0.84 07/14/2019 1126   CREATININE 0.87 07/09/2012 1603   CALCIUM 8.7 (L) 02/13/2021 0517   PROT 6.8 02/11/2021 0609   PROT 6.5 10/17/2020 0924   ALBUMIN 3.5 02/11/2021 0609   ALBUMIN 4.2 10/17/2020 0924   AST 17 02/11/2021 0609   AST 14 (L) 07/14/2019 1126   ALT 16 02/11/2021 0609   ALT 13 07/14/2019 1126   ALKPHOS 66 02/11/2021 0609   BILITOT 1.2 02/11/2021 0609   BILITOT 0.9 10/17/2020 0924   BILITOT 1.3 (H) 07/14/2019 1126   GFRNONAA >60 02/13/2021 0517   GFRNONAA >60 07/14/2019 1126   GFRAA 81 10/17/2020 0924   GFRAA >60 07/14/2019 1126    No results found for: TOTALPROTELP, ALBUMINELP, A1GS, A2GS, BETS, BETA2SER, GAMS, MSPIKE, SPEI  No results found for: KPAFRELGTCHN, LAMBDASER,  KAPLAMBRATIO  Lab Results  Component Value Date   WBC 8.2 07/30/2021   NEUTROABS 5.5 07/30/2021   HGB 13.9 07/30/2021   HCT 42.0 07/30/2021   MCV 86.2 07/30/2021   PLT 217 07/30/2021   No results found for: LABCA2  No components found for: [id number]  No results for input(s): INR in the last 168 hours.  No results found for: LABCA2  No results found for: ION629  No results found for: BMW413  No results found for: KGM010  No results found for: CA2729  No components found for: HGQUANT  No results found for: CEA1 / No results found for: CEA1   No results found for: AFPTUMOR  No results found for: CHROMOGRNA  No results found for: HGBA, HGBA2QUANT, HGBFQUANT, HGBSQUAN (Hemoglobinopathy evaluation)   No results found for: LDH  Lab Results  Component Value Date   IRON 38 (L) 02/10/2012   TIBC 310 02/10/2012   IRONPCTSAT 12 (L) 02/10/2012   (Iron and TIBC)  Lab Results  Component Value Date   FERRITIN 27  02/09/2018    Urinalysis    Component Value Date/Time   COLORURINE YELLOW 11/28/2020 1402   APPEARANCEUR HAZY (A) 11/28/2020 1402   LABSPEC 1.015 11/28/2020 1402   PHURINE 7.0 11/28/2020 1402   GLUCOSEU NEGATIVE 11/28/2020 1402   HGBUR NEGATIVE 11/28/2020 1402   BILIRUBINUR NEGATIVE 11/28/2020 1402   KETONESUR NEGATIVE 11/28/2020 1402   PROTEINUR NEGATIVE 11/28/2020 1402   UROBILINOGEN 1.0 12/08/2014 1303   NITRITE NEGATIVE 11/28/2020 1402   LEUKOCYTESUR SMALL (A) 11/28/2020 1402    STUDIES: MR Shoulder Left w/o contrast  Result Date: 07/22/2021 CLINICAL DATA:  Left shoulder pain and numbness.  No known injury. EXAM: MRI OF THE LEFT SHOULDER WITHOUT CONTRAST TECHNIQUE: Multiplanar, multisequence MR imaging of the shoulder was performed. No intravenous contrast was administered. COMPARISON:  Plain films left shoulder 07/10/2021. FINDINGS: Rotator cuff: The patient has rotator cuff tendinopathy. No tear is identified but there is a small fissure in the  conjoined supraspinatus and infraspinatus at the greater tuberosity. Muscles: No atrophy. At least 2 ganglion cysts are seen in the supraspinatus. The more peripheral is approximately 1.4 cm in diameter. A more medial cyst is incompletely imaged but at least 2 cm in diameter. Biceps long head: Intact. There is a large volume of fluid in the sheath of the tendon in the bicipital groove. More focal appearing fluid measuring approximately 5.5 cm craniocaudal by 2.4 cm transverse by 1.7 cm AP is seen in the bicipital groove. The biceps tendon is peripherally displaced by the fluid or adhesed to the periphery of the sheath. Acromioclavicular Joint: Moderate osteoarthritis. Type 2 acromion. Small volume of subacromial/subdeltoid bursa. Glenohumeral Joint: Severe degenerative change is present. Cartilage is completely denuded. There is subchondral edema and small subchondral cysts about the joint. Large osteophyte is seen off the humeral head. A 2 cm in diameter loose body is in the posterior aspect of the joint. Labrum: The anterior and posterior labrum are severely degenerated and torn. Bones:  No fracture or worrisome lesion. Other: None. 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 2 cm loose body in the posterior aspect of the joint is noted. There is associated degenerative tearing of the anterior and posterior labrum. Tenosynovitis of the long head of biceps. As described above, focal appearing fluid is seen in the sheath in the bicipital groove. The biceps tendon is either peripherally displaced by the fluid or adhesed to the periphery of the sheath. Rotator cuff tendinopathy. There is a small fissure in the conjoined supraspinatus and infraspinatus. Electronically Signed   By: Inge Rise M.D.   On: 07/22/2021 11:19   MM DIAG BREAST TOMO BILATERAL  Result Date: 07/23/2021 CLINICAL DATA:  60 year old underwent malignant lumpectomy of the UPPER OUTER QUADRANT of the RIGHT breast in  2019 with adjuvant radiation therapy. Annual evaluation. EXAM: DIGITAL DIAGNOSTIC BILATERAL MAMMOGRAM WITH TOMOSYNTHESIS AND CAD TECHNIQUE: Bilateral digital diagnostic mammography and breast tomosynthesis was performed. The images were evaluated with computer-aided detection. COMPARISON:  Previous exam(s). ACR Breast Density Category b: There are scattered areas of fibroglandular density. FINDINGS: Full field CC and MLO views of both breasts, a spot magnification MLO view of the lumpectomy site in the RIGHT breast, spot magnification CC and mediolateral views of LEFT breast calcifications and a full field mediolateral view of the LEFT breast were obtained. RIGHT: No findings suspicious for malignancy. Mild post lumpectomy scar/architectural distortion in the UPPER OUTER QUADRANT middle depth. LEFT: Fine heterogeneous calcifications in the inner breast at middle depth in a linear orientation directed  toward the nipple spanning 1.0 cm. No suspicious findings elsewhere. IMPRESSION: 1. Indeterminate 1.0 cm group of calcifications involving the inner LEFT breast at middle depth. 2. Expected post lumpectomy changes involving the RIGHT breast. RECOMMENDATION: Stereotactic tomosynthesis core needle biopsy of the LEFT breast calcifications. The stereotactic tomosynthesis core needle biopsy procedure was discussed with patient and her questions were answered. She wishes to proceed and the biopsy has been scheduled by the [facility] staff. I have discussed the findings and recommendations with the patient. BI-RADS CATEGORY  4: Suspicious. Electronically Signed   By: Evangeline Dakin M.D.   On: 07/23/2021 15:52  XR Shoulder Left  Result Date: 07/10/2021 AP axillary lateral and outlet views of the left shoulder shows subacromial space at 16 mm, adequate for cuff to be present but there is DJD of the left G-H joint with an osteophyte over the inferomedial humeral head and sclerosis of the glenoid rim  and the inferior humeral head. Soft tissue calcification seen in the area of the subscapularis m. Suggesting calcific tendonitis in this area. Moderate degenerative changes of the AC joint.      ELIGIBLE FOR AVAILABLE RESEARCH PROTOCOL: no   ASSESSMENT: 60 y.o. [place], [place] woman status post right breast upper outer quadrant biopsy 06/04/2018 for a clinical T1c N0, stage IA invasive ductal carcinoma, grade 1, estrogen and progesterone receptor strongly positive, HER-2 not amplified, with an MIB-108%  (1) status post right lumpectomy and sentinel lymph node sampling 07/19/2018 for a pT1c pN1, stage IB invasive ductal carcinoma, grade 1, with negative margins  (2) Mammaprint obtained on the definitive surgical specimen showed "low risk", predicting a risk of recurrence outside the breast within 5 years of 2 to 3% if the patient takes antiestrogens for 5 years.  No significant benefit from chemotherapy anticipated  (3) adjuvant radiation 09/01/2018 - 10/19/2018  Site/dose: The patient initially received a dose of 50.4 Gy in 28 fractions to the right breast using whole-breast tangent fields. This was delivered using a 3-D conformal technique. The patient then received a boost to the seroma. This delivered an additional 10 Gy in 5 fractions using 15x photons with a Complex Isodose technique. The total dose was 60.4 Gy.  (4) started anastrozole 11/28/2018, discontinued after 4 weeks with diarrhea and other side effects  (5) exemestane started January 2020.   PLAN: Samanthia is now just over 3 years out from definitive surgery for her breast cancer with no evidence of disease recurrence.  This is very favorable.  She is tolerating exemestane well and the plan is to continue that a total of 5 years  I think she would benefit from our physical therapy pelvic rehab program.  She is agreeable and I have placed that referral for her  Of course we are waiting on the results of the biopsy she will  have later this week.  If it is positive of course we will see her to discuss but otherwise she will return here September of next year, after next year's mammogram, for her yearly follow-up  Total encounter time 25 minutes.*   Pedrohenrique [place], [name] Dad, MD  07/30/21 1:18 PM Medical Oncology and Hematology [facility] [place], [place] [address] Tel. [phone]    Fax. [phone]   I, Wilburn Mylar, am acting as scribe for Dr. Virgie [name]. Shaterria Sager.  I, Lurline Del MD, have reviewed the above documentation for accuracy and completeness, and I agree with the above.   *Total Encounter  Time as defined by the Centers for Medicare and Medicaid Services includes, in addition to the face-to-face time of a patient visit (documented in the note above) non-face-to-face time: obtaining and reviewing outside history, ordering and reviewing medications, tests or procedures, care coordination (communications with other health care professionals or caregivers) and documentation in the medical record.

## 2021-07-31 ENCOUNTER — Telehealth: Payer: Self-pay | Admitting: Oncology

## 2021-07-31 NOTE — Telephone Encounter (Signed)
Scheduled appointment per 08/02 los. Left message.

## 2021-08-01 ENCOUNTER — Other Ambulatory Visit: Payer: Self-pay

## 2021-08-01 ENCOUNTER — Ambulatory Visit
Admission: RE | Admit: 2021-08-01 | Discharge: 2021-08-01 | Disposition: A | Discharge status: Home / Self Care | Payer: Medicaid Other | Source: Ambulatory Visit | Attending: General Surgery | Admitting: General Surgery

## 2021-08-01 DIAGNOSIS — R921 Mammographic calcification found on diagnostic imaging of breast: Secondary | ICD-10-CM | POA: Diagnosis not present

## 2021-08-01 IMAGING — MG MM BREAST BX W LOC DEV 1ST LESION IMAGE BX SPEC STEREO GUIDE*L*
8 of 12 series · 8 of 28 positions shown · non-contrast
Comparison: Previous exams.
COMPARISON: Previous exams.

Addendum:
CLINICAL DATA: Patient with indeterminate left breast
calcifications.

EXAM:
LEFT BREAST STEREOTACTIC CORE NEEDLE BIOPSY

[L (1 of 7)]
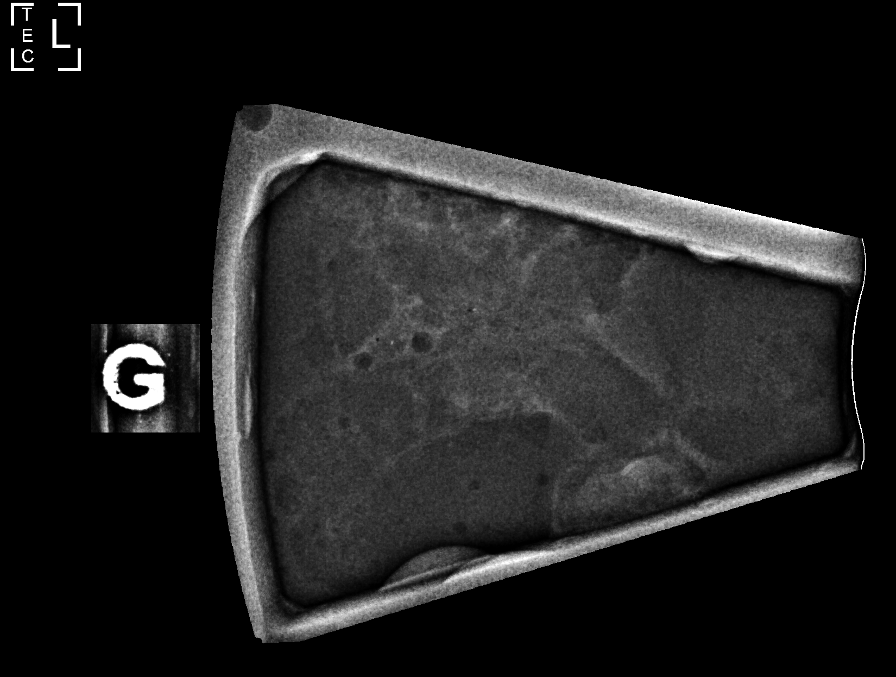

[L (2 of 7)]
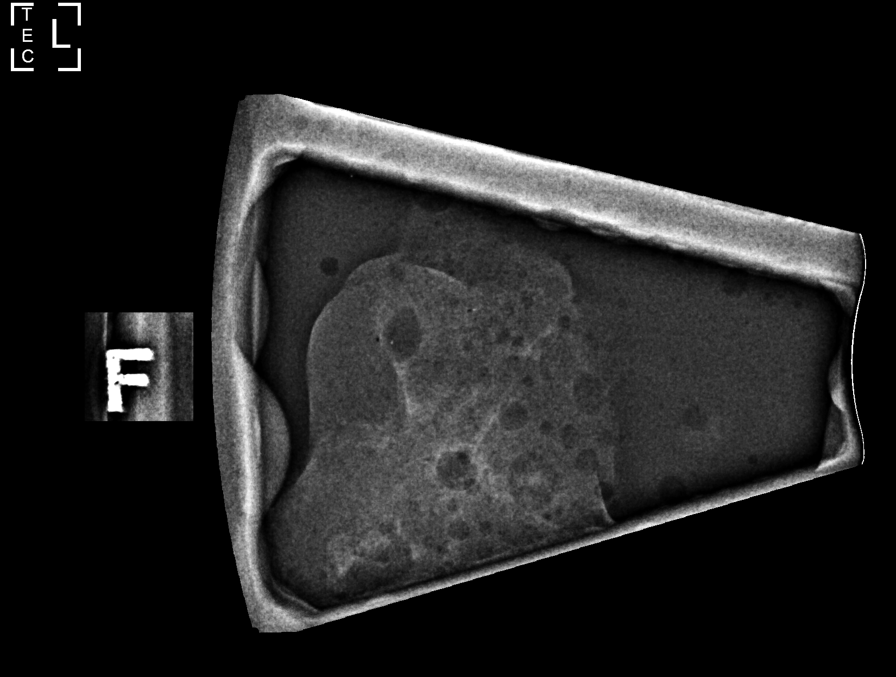

[L (3 of 7)]
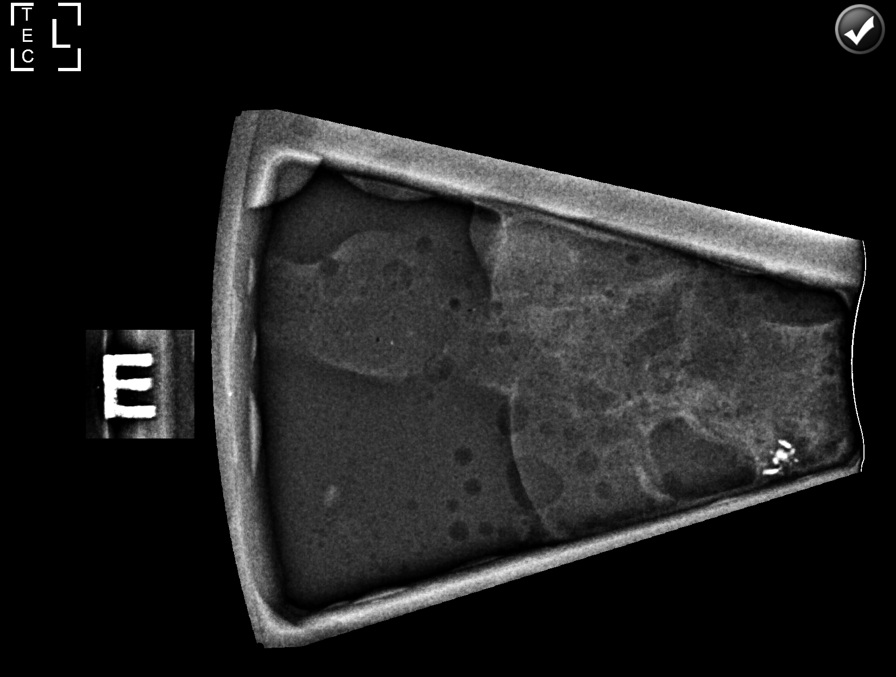

[L (4 of 7)]
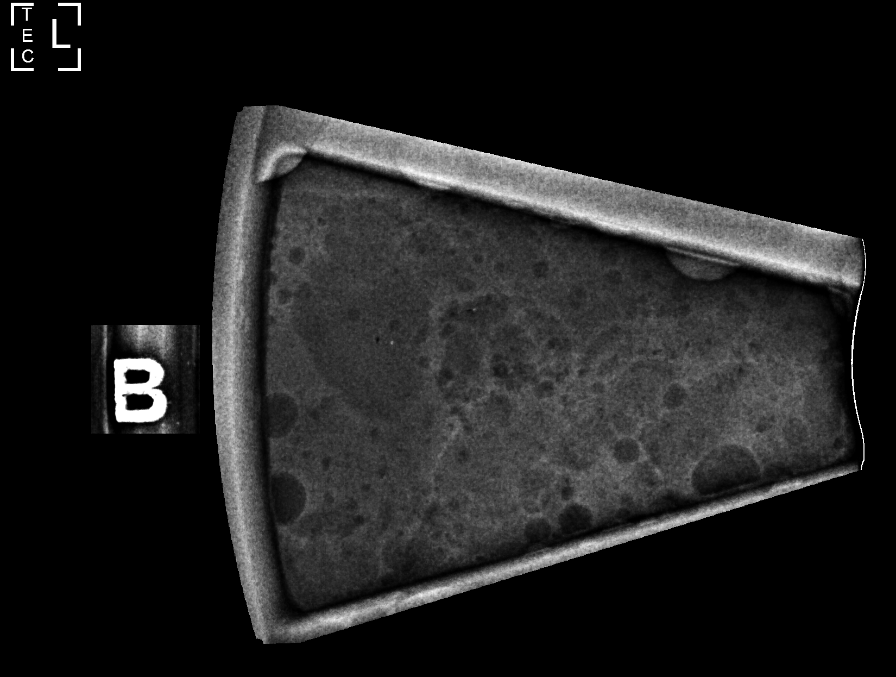

[L (5 of 7)]
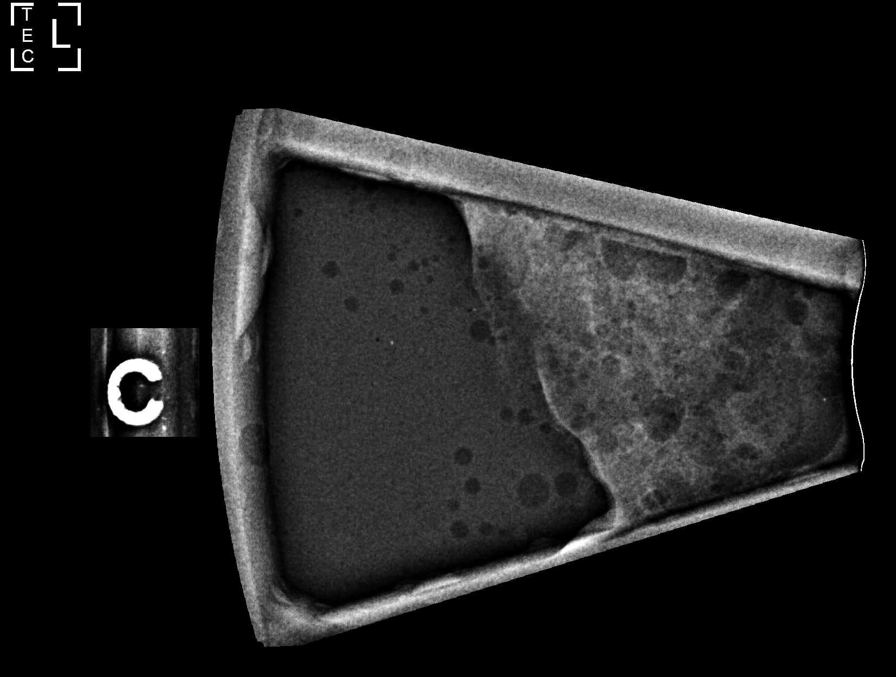

[L (6 of 7)]
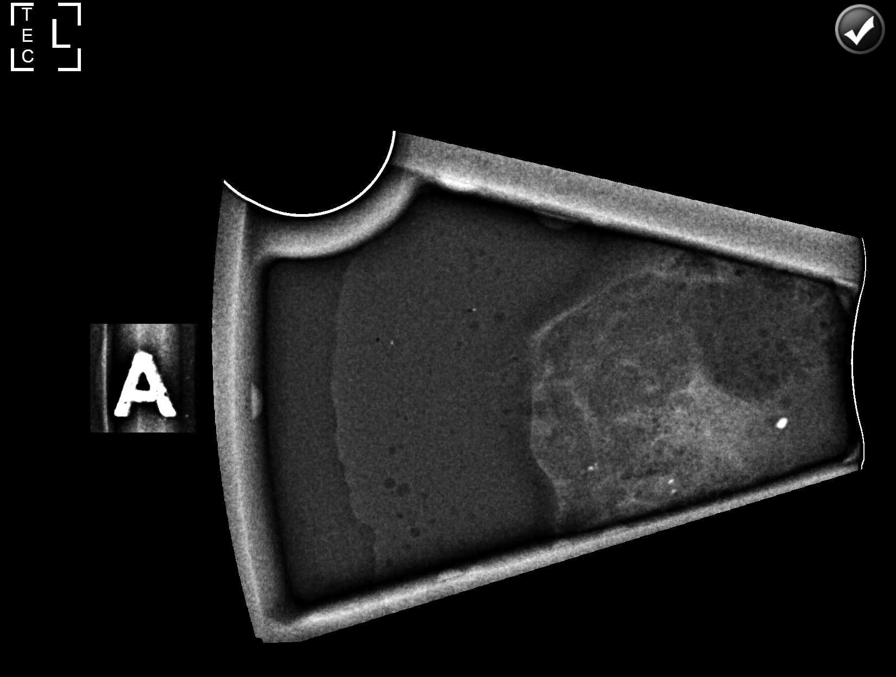

[L (7 of 7)]
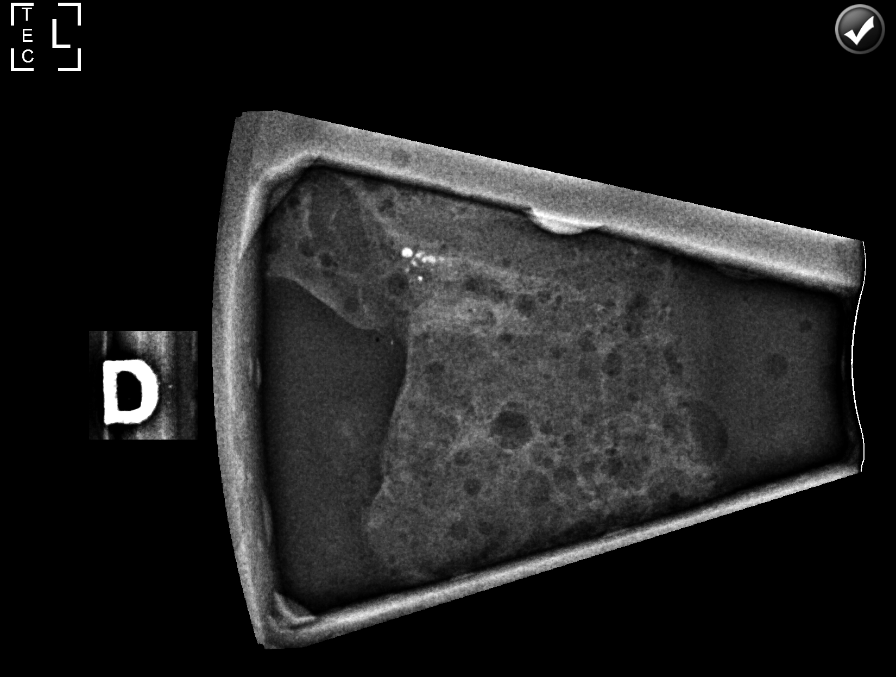

[L ML]
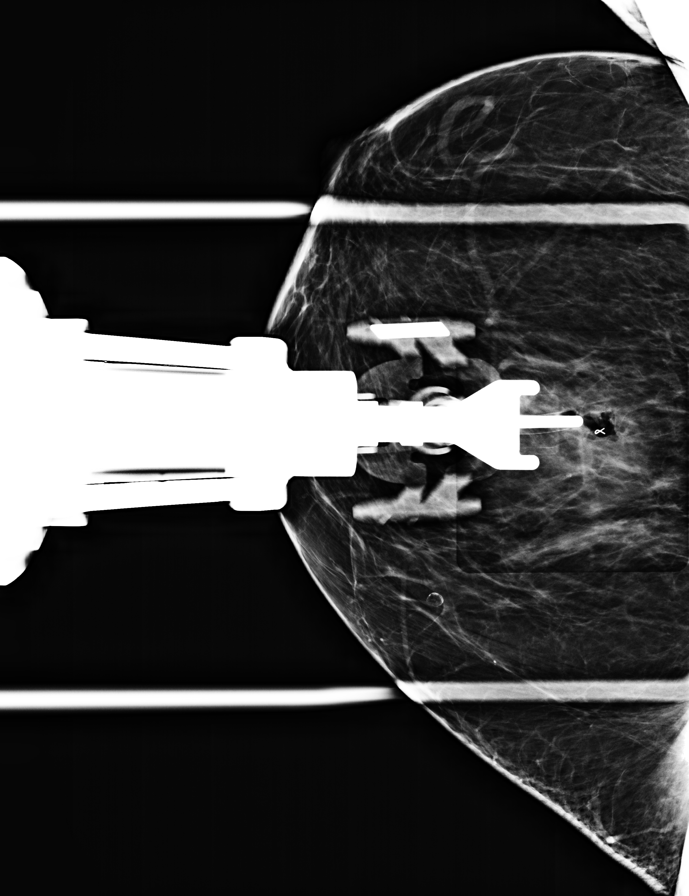

[8 of 28 positions shown; findings below may reference images not displayed]



Using sterile technique and 1% Lidocaine as local anesthetic, under
stereotactic guidance, a 9 gauge vacuum assisted device was used to
perform core needle biopsy of calcifications within the upper inner
left breast using a medial approach. Specimen radiograph was
performed showing calcifications. Specimens with calcifications are
identified for pathology.

Lesion quadrant: Upper inner quadrant

At the conclusion of the procedure, ribbon shaped tissue marker clip
was deployed into the biopsy cavity. Follow-up 2-view mammogram was
performed and dictated separately.
IMPRESSION: Stereotactic-guided biopsy of left breast calcifications. No
apparent complications.

ADDENDUM:
Pathology revealed SMALL HYALINIZED FIBROADENOMA WITH FOCAL
MICROCALCIFICATION, FIBROCYSTIC CHANGES WITH APOCRINE METAPLASIA of
the Left breast, medial calcifications, (ribbon clip). This was
found to be concordant by Dr. RABA.

Pathology results were discussed with the patient by telephone by
RABA, RN Nurse Navigator. The patient reported doing well
after the biopsy with tenderness at the site. Post biopsy
instructions and care were reviewed and questions were answered. The
patient was encouraged to call [REDACTED] for any additional concerns.

The patient was instructed to return for annual screening
mammography.

Pathology results reported by RABA, RN on [DATE].



Using sterile technique and 1% Lidocaine as local anesthetic, under
stereotactic guidance, a 9 gauge vacuum assisted device was used to
perform core needle biopsy of calcifications within the upper inner
left breast using a medial approach. Specimen radiograph was
performed showing calcifications. Specimens with calcifications are
identified for pathology.

Lesion quadrant: Upper inner quadrant

At the conclusion of the procedure, ribbon shaped tissue marker clip
was deployed into the biopsy cavity. Follow-up 2-view mammogram was
performed and dictated separately.
IMPRESSION: Stereotactic-guided biopsy of left breast calcifications. No
apparent complications.

## 2021-08-01 IMAGING — MG MM BREAST LOCALIZATION CLIP
4 series · 4 of 12 positions shown · non-contrast
Comparison: Previous exam(s).

CLINICAL DATA: Patient status post stereotactic guided biopsy left
breast calcifications.

EXAM:
3D DIAGNOSTIC LEFT MAMMOGRAM POST STEREOTACTIC BIOPSY

[L CC synth-2D]
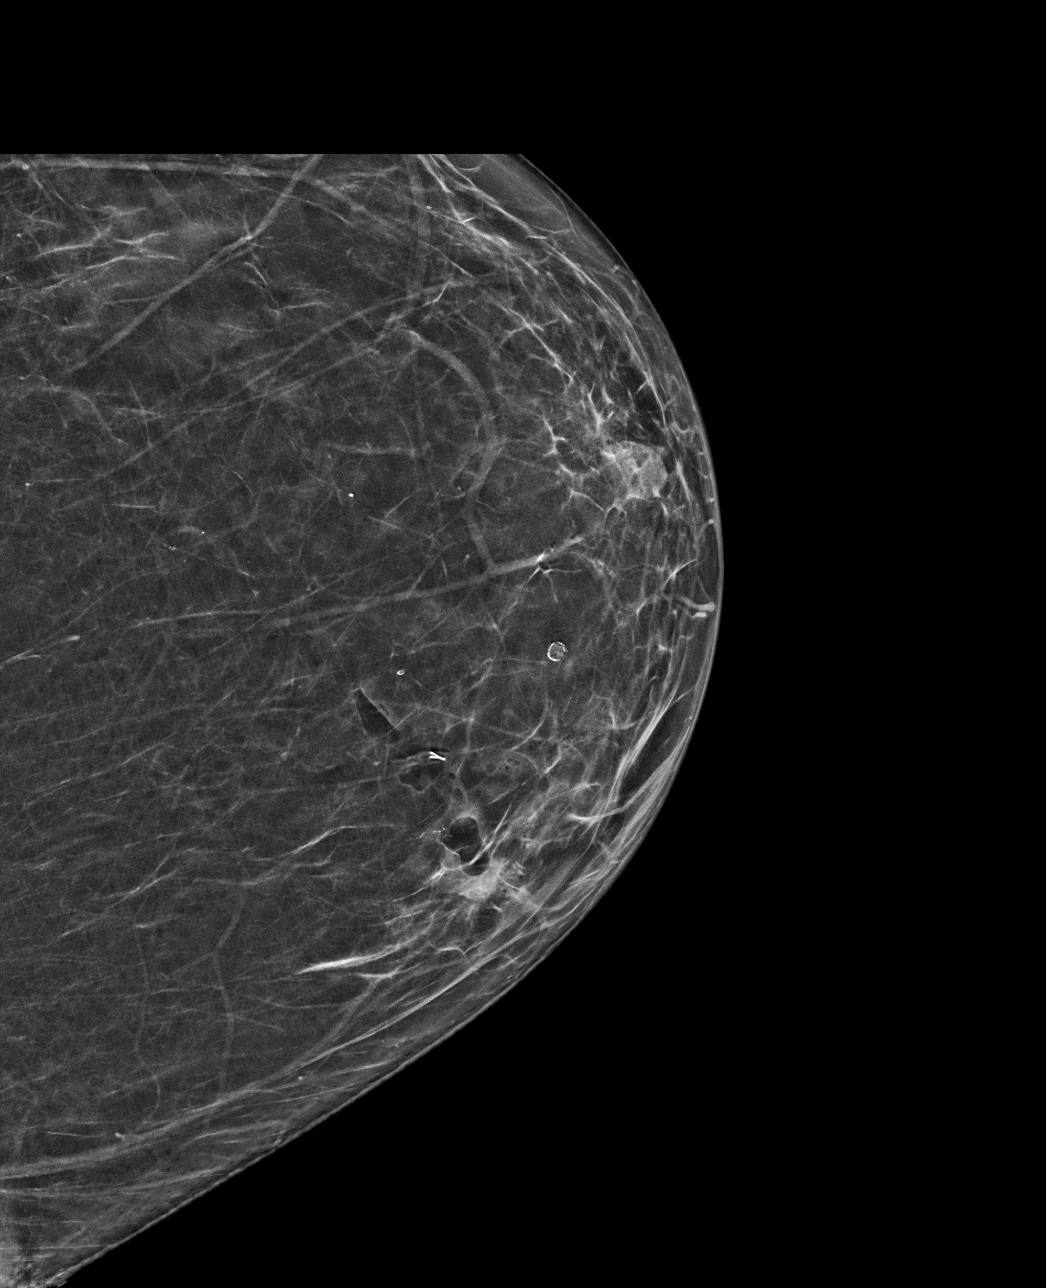

[L ML synth-2D]
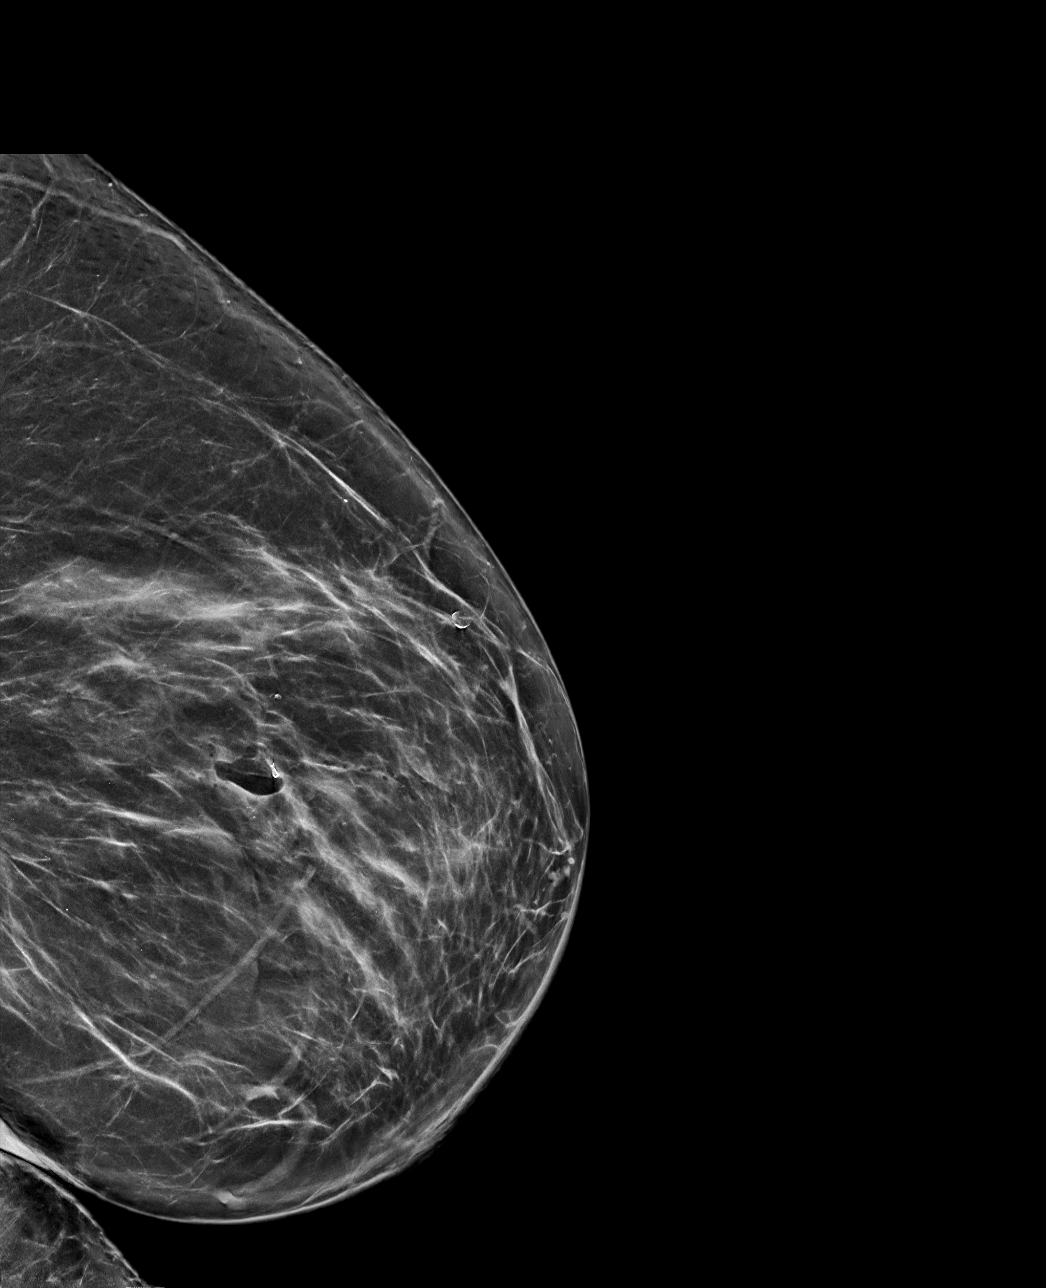

[L ML tomo · tomo slice 41/81.0]
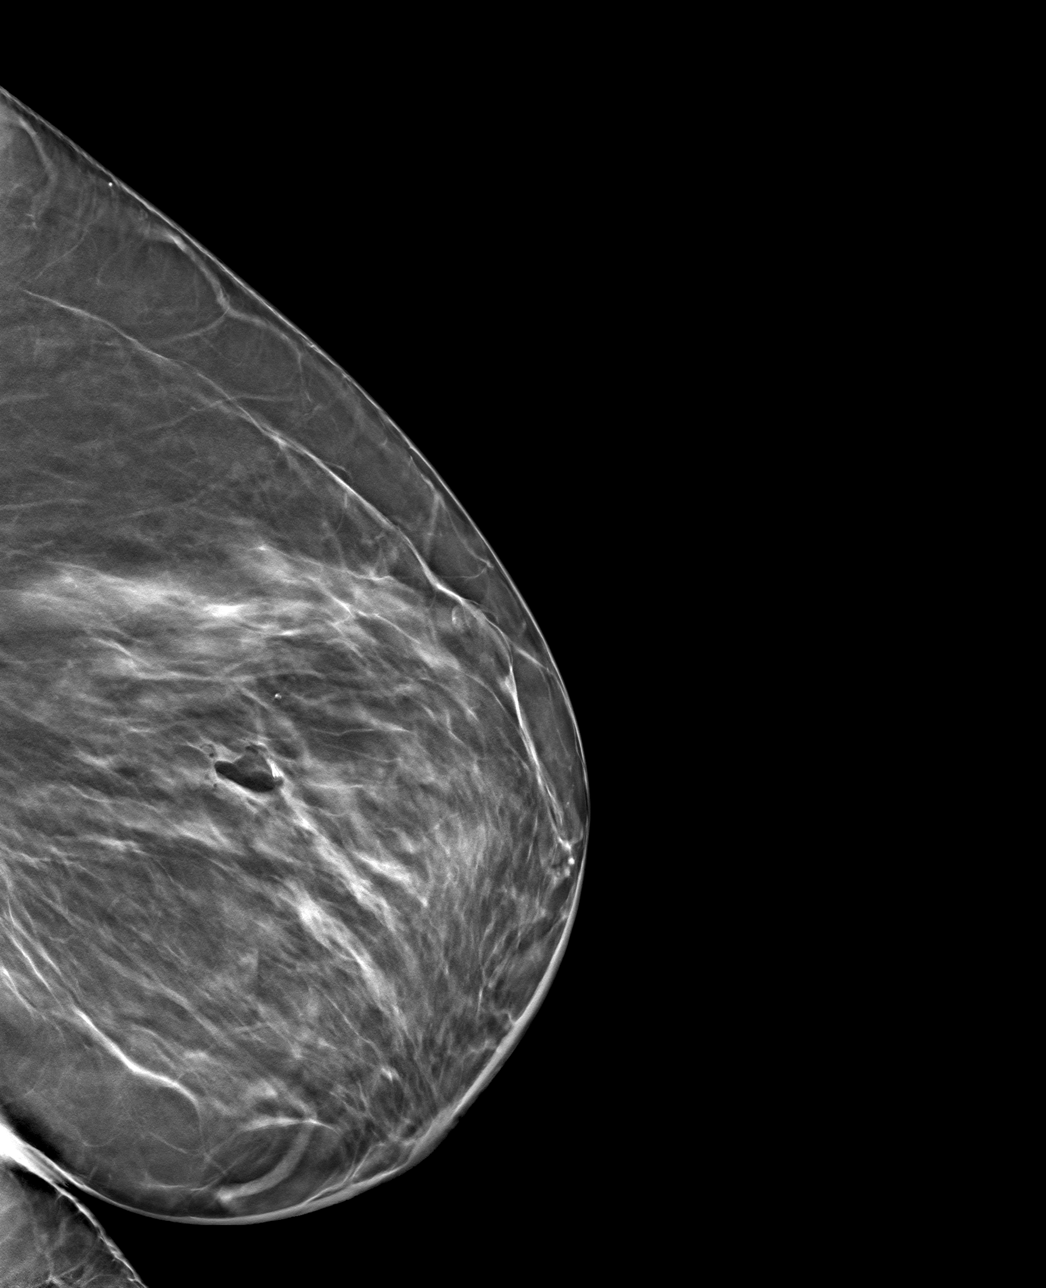

[L CC tomo · tomo slice 33/66.0]
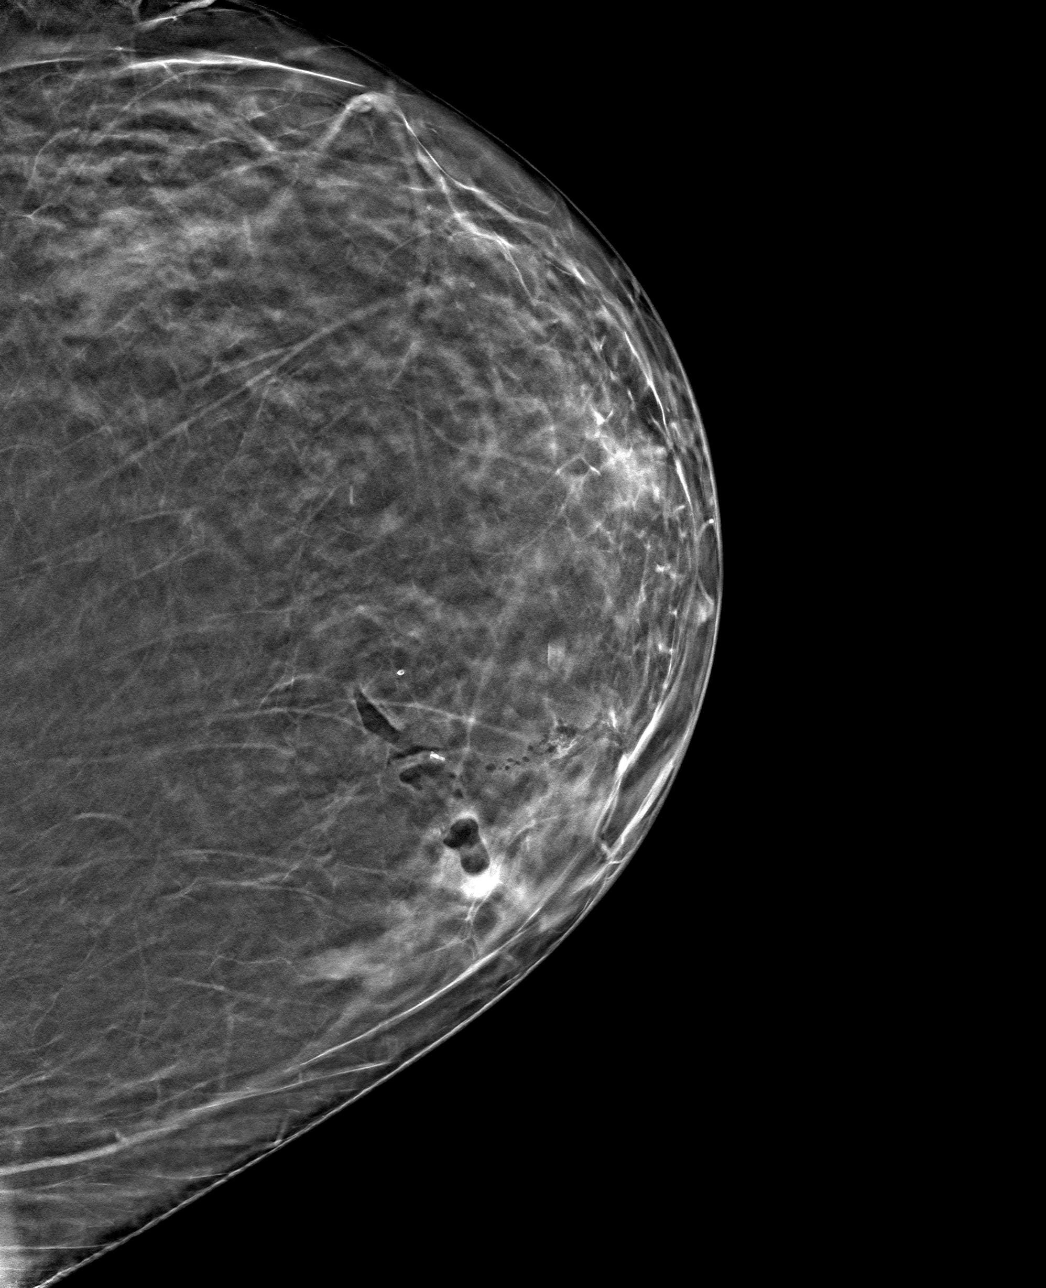

[4 of 12 positions shown; findings below may reference images not displayed]

FINDINGS: 3D Mammographic images were obtained following stereotactic guided
biopsy of left breast calcifications. The biopsy marking clip is
approximately 1.4 cm lateral to the site of biopsied calcifications.
IMPRESSION: Ribbon shaped marking clip is located approximately 1.4 cm lateral
to the site of biopsied calcifications.

Final Assessment: Post Procedure Mammograms for Marker Placement

## 2021-08-02 ENCOUNTER — Ambulatory Visit (INDEPENDENT_AMBULATORY_CARE_PROVIDER_SITE_OTHER): Payer: Medicaid Other | Admitting: Specialist

## 2021-08-02 ENCOUNTER — Encounter: Payer: Self-pay | Admitting: Specialist

## 2021-08-02 VITALS — BP 144/88 | HR 94 | Ht 63.0 in | Wt 308.0 lb

## 2021-08-02 DIAGNOSIS — T8484XD Pain due to internal orthopedic prosthetic devices, implants and grafts, subsequent encounter: Secondary | ICD-10-CM | POA: Diagnosis not present

## 2021-08-02 DIAGNOSIS — M4316 Spondylolisthesis, lumbar region: Secondary | ICD-10-CM | POA: Diagnosis not present

## 2021-08-02 DIAGNOSIS — M25512 Pain in left shoulder: Secondary | ICD-10-CM

## 2021-08-02 DIAGNOSIS — E119 Type 2 diabetes mellitus without complications: Secondary | ICD-10-CM

## 2021-08-02 DIAGNOSIS — M19012 Primary osteoarthritis, left shoulder: Secondary | ICD-10-CM | POA: Diagnosis not present

## 2021-08-02 DIAGNOSIS — Z981 Arthrodesis status: Secondary | ICD-10-CM | POA: Diagnosis not present

## 2021-08-02 DIAGNOSIS — Z96651 Presence of right artificial knee joint: Secondary | ICD-10-CM

## 2021-08-02 MED ORDER — GABAPENTIN 300 MG PO CAPS: ORAL_CAPSULE | ORAL | 4 refills | Status: DC

## 2021-08-02 NOTE — Progress Notes (Signed)
Office Visit Note   Patient: [name]           Date of Birth: [birth year]           MRN: [id number] Visit Date: 08/02/2021              Requested by: [name], MD [address] [place],  [place] [address] PCP: [name], MD   Assessment & Plan: Visit Diagnoses:  1. Left shoulder pain, unspecified chronicity   2. Primary osteoarthritis, left shoulder   3. Pain due to total right knee replacement, subsequent encounter     Plan: Avoid overhead lifting and overhead use of the arms. Pillows to keep from sleeping directly on the shoulders Limited lifting to less than 10 lbs. Ice or heat for relief. NSAIDs are helpful, such as alleve or motrin, be careful not to use in excess as they place burdens on the kidney. Stretching exercise help and strengthening is helpful to build endurance.  Referral to Dr. Marlou Sa for evaluation and treatment of left shoulder severe osteoarthritis.  Follow-Up Instructions: No follow-ups on file.   Orders:  No orders of the defined types were placed in this encounter.  No orders of the defined types were placed in this encounter.     Procedures: No procedures performed   Clinical Data: Findings:  CLINICAL DATA:  Left shoulder pain and numbness.  No known injury.  EXAM: MRI OF THE LEFT SHOULDER WITHOUT CONTRAST  TECHNIQUE: Multiplanar, multisequence MR imaging of the shoulder was performed. No intravenous contrast was administered.  COMPARISON:  Plain films left shoulder 07/10/2021.  FINDINGS: Rotator cuff: The patient has rotator cuff tendinopathy. No tear is identified but there is a small fissure in the conjoined supraspinatus and infraspinatus at the greater tuberosity.  Muscles: No atrophy. At least 2 ganglion cysts are seen in the supraspinatus. The more peripheral is approximately 1.4 cm in diameter. A more medial cyst is incompletely imaged but at least 2 cm in diameter.  Biceps long head: Intact.  There is a large volume of fluid in the sheath of the tendon in the bicipital groove. More focal appearing fluid measuring approximately 5.5 cm craniocaudal by 2.4 cm transverse by 1.7 cm AP is seen in the bicipital groove. The biceps tendon is peripherally displaced by the fluid or adhesed to the periphery of the sheath.  Acromioclavicular Joint: Moderate osteoarthritis. Type 2 acromion. Small volume of subacromial/subdeltoid bursa.  Glenohumeral Joint: Severe degenerative change is present. Cartilage is completely denuded. There is subchondral edema and small subchondral cysts about the joint. Large osteophyte is seen off the humeral head. A 2 cm in diameter loose body is in the posterior aspect of the joint.  Labrum: The anterior and posterior labrum are severely degenerated and torn.  Bones:  No fracture or worrisome lesion.  Other: None.  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 2 cm loose body in the posterior aspect of the joint is noted. There is associated degenerative tearing of the anterior and posterior labrum.  Tenosynovitis of the long head of biceps. As described above, focal appearing fluid is seen in the sheath in the bicipital groove. The biceps tendon is either peripherally displaced by the fluid or adhesed to the periphery of the sheath.  Rotator cuff tendinopathy. There is a small fissure in the conjoined supraspinatus and infraspinatus.   Electronically Signed   By: Inge Rise M.D.   On: 07/22/2021 11:19    Subjective: Chief Complaint  Patient  presents with  . Left Shoulder - Pain, Follow-up    MRI left shoulder review    60 year old female post lumbar fusion for spondylolisthesis and she has done well with this   Review of Systems   Objective: Vital Signs: BP (!) 144/88   Pulse 94   Ht '5\' 3"'$  (1.6 m)   Wt (!) 308 lb (139.7 kg)   LMP 02/03/2012   BMI 54.56 kg/m   Physical Exam  Ortho  Exam  Specialty Comments:  No specialty comments available.  Imaging: MM CLIP PLACEMENT LEFT  Result Date: 08/01/2021 CLINICAL DATA:  Patient status post stereotactic guided biopsy left breast calcifications. EXAM: 3D DIAGNOSTIC LEFT MAMMOGRAM POST STEREOTACTIC BIOPSY COMPARISON:  Previous exam(s). FINDINGS: 3D Mammographic images were obtained following stereotactic guided biopsy of left breast calcifications. The biopsy marking clip is approximately 1.4 cm lateral to the site of biopsied calcifications. IMPRESSION: Ribbon shaped marking clip is located approximately 1.4 cm lateral to the site of biopsied calcifications. Final Assessment: Post Procedure Mammograms for Marker Placement Electronically Signed   By: Lovey Newcomer M.D.   On: 08/01/2021 13:06  MM LT BREAST BX W LOC DEV 1ST LESION IMAGE BX SPEC STEREO GUIDE  Result Date: 08/01/2021 CLINICAL DATA:  Patient with indeterminate left breast calcifications. EXAM: LEFT BREAST STEREOTACTIC CORE NEEDLE BIOPSY COMPARISON:  Previous exams. FINDINGS: The patient and I discussed the procedure of stereotactic-guided biopsy including benefits and alternatives. We discussed the high likelihood of a successful procedure. We discussed the risks of the procedure including infection, bleeding, tissue injury, clip migration, and inadequate sampling. Informed written consent was given. The usual time out protocol was performed immediately prior to the procedure. Using sterile technique and 1% Lidocaine as local anesthetic, under stereotactic guidance, a 9 gauge vacuum assisted device was used to perform core needle biopsy of calcifications within the upper inner left breast using a medial approach. Specimen radiograph was performed showing calcifications. Specimens with calcifications are identified for pathology. Lesion quadrant: Upper inner quadrant At the conclusion of the procedure, ribbon shaped tissue marker clip was deployed into the biopsy cavity. Follow-up  2-view mammogram was performed and dictated separately. IMPRESSION: Stereotactic-guided biopsy of left breast calcifications. No apparent complications. Electronically Signed   By: Lovey Newcomer M.D.   On: 08/01/2021 13:05    PMFS History: Patient Active Problem List   Diagnosis Date Noted  . Spondylolisthesis, lumbar region 02/11/2021    Priority: High    Class: Acute  . Status post lumbar spinal fusion 02/11/2021  . URI with cough and congestion 12/04/2020  . Neuropathy of forearm, right 09/25/2020  . Shoulder pain, left 09/25/2020  . Tick bite 04/06/2020  . Stiffness of hand joint 06/09/2019  . Right arm numbness 06/09/2019  . Low back pain 05/02/2019  . Pain of left hip joint 08/09/2018  . Malignant neoplasm of upper-outer quadrant of right breast in female, estrogen receptor positive ([place]) 06/10/2018  . Screening for colon cancer 03/12/2018  . Screening for breast cancer 03/12/2018  . Restless leg syndrome 02/04/2018  . 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 Depression 08/06/2015  . Hypertension associated with diabetes ([place])   . Aortic stenosis   . Chronic diastolic congestive heart failure ([place])   . COPD (chronic obstructive pulmonary disease) ([place]) 09/21/2013  . Obstructive sleep apnea 09/21/2013  . Nocturnal hypoxemia 09/12/2013  . Abnormal CT scan, chest 05/12/2013  . Hyperlipidemia 11/30/2012  . History of pulmonary embolus (PE) 07/09/2012  . Normocytic anemia  02/08/2012  . Type 2 diabetes mellitus without complication, without long-term current use of insulin ([place]) 02/08/2012  . Asthma 02/08/2012  . GERD (gastroesophageal reflux disease)   . CAD (coronary artery disease) 12/08/2011   Past Medical History:  Diagnosis Date  . Anemia   . Aortic valve stenosis, severe   . 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 Asthma   . Blood transfusion     2013[facility]  . Breast cancer in female [facility])    Right  . CAD (coronary artery disease)    Cath 2010 with DES x 1 RCA-- PT'S CARDIOLOGIST IS DR. [place]  . Chronic diastolic congestive heart failure ([place])   . COPD (chronic obstructive pulmonary disease) ([place])   . Depression   . Diabetes mellitus DIAGNOSED IN2010   Controll s with diet  . Dyspnea    with much ambulation  . Eczema    on back  . Headache    migraines younger- rare now 02/07/21  . Heart murmur   . History of hiatal hernia   . History of kidney stones    passed or blasted  . Hyperlipidemia   . Hypertension   . Morbid obesity with body mass index of 60.0-69.9 in adult [facility])   . Myocardial infarction ([place])    PT THINKS SHE WAS DX WITH MI AT THE TIME OF HEART STENTING  . Neuromuscular disorder ([place])    bilateral hands  . Personal history of radiation therapy   . Pneumonia   . Pulmonary embolism ([place]) 02/08/2012   S/P RT TOTAL KNEE ON 02/03/12--ON 02/08/12--DEVELOPED ACUTE SOB AND CHEST PAIN--AND DIAGNOSED WITH  PULMONARY EMBOLUS AND PNEUMONIA  . Restless leg syndrome   . Sleep apnea    uses 3 liters O2 at night   . Uterine fibroid    NO PROBLEMS AT PRESENT FROM THE FIBROIDS-STATES SHE IS POST MENOPAUSAL-LAST MENSES 2010 EXCEPT FOR EPISODE THIS YR OF BLEEDING RELATED TO FIBROIDS.  Marland Kitchen Weakness    BOTH HANDS - S/P BILATERAL CARPAL TUNNEL RELEASE--BUT STILL HAS WEAKNESS--OFTEN DROPS THINGS    Family History  Problem Relation Age of Onset  . Breast cancer Mother        stage IV at diagnosis  . Emphysema Mother        smoked  . Heart disease Mother   . COPD Father        smoked  . Asthma Father   . Heart disease Father   . Cancer Brother        Sinus    Past Surgical History:  Procedure Laterality Date  . BREAST BIOPSY Right 06/04/2018  . BREAST LUMPECTOMY Right 06/2018  . BREAST LUMPECTOMY WITH RADIOACTIVE SEED AND SENTINEL LYMPH NODE BIOPSY Right 07/19/2018   Procedure: BREAST LUMPECTOMY WITH RADIOACTIVE SEED  AND SENTINEL LYMPH NODE BIOPSY;  Surgeon: Alphonsa Overall, MD;  Location: [facility];  Service: General;  Laterality: Right;  . CARDIAC CATHETERIZATION    . CARDIAC VALVE REPLACEMENT     2017  . CARPAL TUNNEL RELEASE     Bilateral  . CHOLECYSTECTOMY    . CORONARY ANGIOPLASTY     2010 has stent in place  . CYSTOSCOPY W/ RETROGRADES Right 09/21/2013   Procedure: CYSTOSCOPY WITH RIGHT RETROGRADE PYELOGRAM RIGHT DOUBLE J STENT ;  Surgeon: Fredricka Bonine, MD;  Location: [facility];  Service: Urology;  Laterality: Right;  . CYSTOSCOPY WITH URETEROSCOPY AND STENT PLACEMENT Right 10/25/2013   Procedure: CYSTOSCOPY RIGHT URETEROSCOPY HOLMIUM LASER LITHO AND STENT PLACEMENT;  Surgeon: Fredricka Bonine, MD;  Location: [facility];  Service: Urology;  Laterality: Right;  . HERNIA REPAIR    . INTRAOPERATIVE TRANSESOPHAGEAL ECHOCARDIOGRAM N/A 12/12/2014   Procedure: INTRAOPERATIVE TRANSESOPHAGEAL ECHOCARDIOGRAM;  Surgeon: Burnell Blanks, MD;  Location: [facility] OR;  Service: Cardiovascular;  Laterality: N/A;  . JOINT REPLACEMENT     bil total knees  . KNEE ARTHROPLASTY  02/03/2012   Procedure: COMPUTER ASSISTED TOTAL KNEE ARTHROPLASTY;  Surgeon: Mcarthur Rossetti, MD;  Location: [facility];  Service: Orthopedics;  Laterality: Right;  Right total knee arthroplasty  . LEFT AND RIGHT HEART CATHETERIZATION WITH CORONARY ANGIOGRAM N/A 03/17/2013   Procedure: LEFT AND RIGHT HEART CATHETERIZATION WITH CORONARY ANGIOGRAM;  Surgeon: Burnell Blanks, MD;  Location[facility] CATH LAB;  Service: Cardiovascular;  Laterality: N/A;  . LEFT AND RIGHT HEART CATHETERIZATION WITH CORONARY/GRAFT ANGIOGRAM N/A 09/14/2014   Procedure: LEFT AND RIGHT HEART CATHETERIZATION WITH Beatrix Fetters;  Surgeon: Burnell Blanks, MD;  Location: [facility] C[facility] LAB;  Service: Cardiovascular;  Laterality: N/A;  . TEE WITHOUT CARDIOVERSION N/A 03/14/2013   Procedure: TRANSESOPHAGEAL ECHOCARDIOGRAM (TEE);  Surgeon: Lelon Perla, MD;  [facility];  Service: Cardiovascular;  Laterality: N/A;  . TEE WITHOUT CARDIOVERSION N/A 11/14/2014   Procedure: TRANSESOPHAGEAL ECHOCARDIOGRAM (TEE);  Surgeon: Lelon Perla, MD;  Location[facility]COPY;  Service: Cardiovascular;  Laterality: N/A;  . TONSILLECTOMY     maybe as a child- does not know  . TOTAL KNEE ARTHROPLASTY  09/10/2012   Procedure: TOTAL KNEE ARTHROPLASTY;  Surgeon: Mcarthur Rossetti, MD;  [facility];  Service: Orthopedics;  Laterality: Left;  . TOTAL KNEE REVISION Right 07/15/2013   Procedure: REVISION ARTHROPLASTY RIGHT KNEE;  Surgeon: Mcarthur Rossetti, MD;  Location: [facility];  Service: Orthopedics;  Laterality: Right;  . TRANSCATHETER AORTIC VALVE REPLACEMENT, TRANSFEMORAL N/A 12/12/2014   Procedure: TRANSCATHETER AORTIC VALVE REPLACEMENT, TRANSFEMORAL;  Surgeon: Burnell Blanks, MD;  Location: [place];  Service: Cardiovascular;  Laterality: N/A;  . TRIGGER FINGER RELEASE  09/10/2012   Procedure: RELEASE TRIGGER FINGER/A-1 PULLEY;  Surgeon: Mcarthur Rossetti, MD;  Location: [facility];  Service: Orthopedics;  Laterality: Right;  Right Ring Finger  . TUBAL LIGATION     Social History   Occupational History  . Occupation: Disabled  Tobacco Use  . Smoking status: Former    Packs/day: 1.50    Years: 30.00    Pack years: 45.00    Types: Cigarettes    Quit date: 12/29/2000    Years since quitting: 20.6  . Smokeless tobacco: Never  Vaping Use  . Vaping Use: Never used  Substance and Sexual Activity  . Alcohol use: Not Currently  . Drug use: Not Currently  . Sexual activity: Not Currently    Birth control/protection: Surgical

## 2021-08-02 NOTE — Patient Instructions (Signed)
Plan: Avoid overhead lifting and overhead use of the arms. Pillows to keep from sleeping directly on the shoulders Limited lifting to less than 10 lbs. Ice or heat for relief. NSAIDs are helpful, such as alleve or motrin, be careful not to use in excess as they place burdens on the kidney. Stretching exercise help and strengthening is helpful to build endurance.  Referral to Dr. Marlou Sa for evaluation and treatment of left shoulder severe osteoarthritis.

## 2021-08-02 NOTE — Addendum Note (Signed)
Addended by: [name] on: 08/02/2021 11:03 AM   Modules accepted: Orders

## 2021-08-14 ENCOUNTER — Ambulatory Visit (INDEPENDENT_AMBULATORY_CARE_PROVIDER_SITE_OTHER): Payer: Medicaid Other | Admitting: Orthopedic Surgery

## 2021-08-14 ENCOUNTER — Other Ambulatory Visit: Payer: Self-pay

## 2021-08-14 DIAGNOSIS — M542 Cervicalgia: Secondary | ICD-10-CM | POA: Diagnosis not present

## 2021-08-14 DIAGNOSIS — M25512 Pain in left shoulder: Secondary | ICD-10-CM

## 2021-08-16 ENCOUNTER — Ambulatory Visit: Payer: Medicaid Other | Admitting: Specialist

## 2021-08-16 ENCOUNTER — Encounter: Payer: Self-pay | Admitting: Orthopedic Surgery

## 2021-08-16 NOTE — Progress Notes (Signed)
Office Visit Note   Patient: [name]           Date of Birth: Jul 28, 1961           MRN: [id number] Visit Date: 08/14/2021 Requested by: [name], MD [place],  [place] [address] PCP: [name], MD  Subjective: Chief Complaint  Patient presents with   Right Shoulder - Pain    HPI: [name] is a 60 y.o. female who presents to the office complaining of left shoulder pain.  Patient complains of pain over the last year plus.  She states that years ago "I had to catch a really heavy lady from falling".  As when she feels her shoulder pain started.  She describes anterior lateral pain with radiation to the elbow.  She has numbness into her hand just about every day as well as scapular and neck pain.  She denies any history of shoulder or neck surgery.  She does have history of lumbar spine fusion by Dr Louanne Skye.  No history of shoulder dislocation but she does feel like her shoulder subluxes at times.  She has never had her neck worked up though she does have history of a whiplash injury by her history.  She has never had an MRI scan of her neck or any diagnosis of disc herniation of the cervical spine.  She has had bilateral carpal tunnel release by Dr. Louanne Skye.  She reports her shoulder pain wakes her up at night most nights.  She is right-hand dominant.  She has constant pain with grinding and catching.  She has had prior cortisone injection more than 3 months ago that was only good for about 1 day.  She is also had physical therapy extensively in the past that has not provided any relief of her shoulder pain.  She does have history of cardiac disease with cardiologist Dr. Angelena Form.  Last A1c 6.0.  She does not smoke.  She has had a husband who had shoulder replacement so she understands somewhat what the recovery for that surgery is.              ROS: All systems reviewed are negative as they relate to the chief complaint within the history of present  illness.  Patient denies fevers or chills.  Assessment & Plan: Visit Diagnoses:  1. Left shoulder pain, unspecified chronicity   2. Cervicalgia     Plan: Patient is a 60 year old female who presents complaining of left shoulder pain.  She has history of left shoulder pain for over a year.  Most of her pain is in the anterior lateral shoulder with radiation to the elbow but she does have some symptoms by her history that is concerning for radicular pain from the cervical spine.  Additionally, she is a little weaker with the bicep compared with the right arm.  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  She has good rotator cuff strength on exam but definite reduction of range of motion of the left shoulder compared with the right.  Plan is to obtain thin cut CT scan of the left shoulder for preoperative planning purposes.  Anticipate total shoulder arthroplasty with convertible baseplate on the glenoid.  Also ordered MRI of the cervical spine to evaluate for source of left radiculopathy.  May consider injections into the cervical spine prior to surgery to see how much if  any of her left shoulder pain is referred from her neck.  If the MRI of the cervical spine is negative for underwhelming, plan to press on with planning for left total shoulder arthroplasty.  Discussed the risks and benefits of the procedure with the patient today including the risk of nerve/vessel damage, shoulder instability, shoulder stiffness, prosthetic joint infection, operative complication such as fracture, medical complication from surgery.  She understands the risks as well as the recovery time frame and wishes to proceed.  She will need cardiac risk stratification prior to procedure.  Follow-Up Instructions: No follow-ups on file.   Orders:  Orders Placed This Encounter  Procedures   CT SHOULDER LEFT WO CONTRAST   MR  Cervical Spine w/o contrast   No orders of the defined types were placed in this encounter.     Procedures: No procedures performed   Clinical Data: No additional findings.  Objective: Vital Signs: LMP 02/03/2012   Physical Exam:  Constitutional: Patient appears well-developed HEENT:  Head: Normocephalic Eyes:EOM are normal Neck: Normal range of motion Cardiovascular: Normal rate Pulmonary/chest: Effort normal Neurologic: Patient is alert Skin: Skin is warm Psychiatric: Patient has normal mood and affect  Ortho Exam: Ortho exam demonstrates left shoulder with 30 degrees external rotation, 85 degrees abduction, 140 degrees forward flexion.  Excellent rotator cuff strength of supra, infra, subscap.  Axillary nerve intact with deltoid firing.  5/5 motor strength of bilateral grip strength, finger abduction, pronation/supination, tricep, deltoid.  5 -/5 motor strength of left bicep compared with 5/5 motor strength of right bicep.  Mild tenderness throughout the axial cervical spine.  Negative Spurling sign.  Specialty Comments:  No specialty comments available.  Imaging: No results found.   PMFS History: Patient Active Problem List   Diagnosis Date Noted   Spondylolisthesis, lumbar region 02/11/2021    Class: Acute   Status post lumbar spinal fusion 02/11/2021   URI with cough and congestion 12/04/2020   Neuropathy of forearm, right 09/25/2020   Shoulder pain, left 09/25/2020   Tick bite 04/06/2020   Stiffness of hand joint 06/09/2019   Right arm numbness 06/09/2019   Low back pain 05/02/2019   Pain of left hip joint 08/09/2018   Malignant neoplasm of upper-outer quadrant of right breast in female, estrogen receptor positive ([place]) 06/10/2018   Screening for colon cancer 03/12/2018   Screening for breast cancer 03/12/2018   Restless leg syndrome 02/04/2018   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   Depression 08/06/2015   Hypertension associated with  diabetes ([place])    Aortic stenosis    Chronic diastolic congestive heart failure (HCC)    COPD (chronic obstructive pulmonary disease) ([place]) 09/21/2013   Obstructive sleep apnea 09/21/2013   Nocturnal hypoxemia 09/12/2013   Abnormal CT scan, chest 05/12/2013   Hyperlipidemia 11/30/2012   History of pulmonary embolus (PE) 07/09/2012   Normocytic anemia 02/08/2012   Type 2 diabetes mellitus without complication, without long-term current use of insulin ([place]) 02/08/2012   Asthma 02/08/2012   GERD (gastroesophageal reflux disease)    CAD (coronary artery disease) 12/08/2011   Past Medical History:  Diagnosis Date   Anemia    Aortic valve stenosis, severe    Arthritis    PAIN AND SEVERE OA LEFT KNEE ; S/P RIGHT TKA ON 02/03/12; HAS LOWER BACK PAIN-UNABLE TO STAND MORE THAN 10 MIN; ARTHRITIS "ALL OVER"   Asthma    Blood transfusion    [facility]   Breast cancer in female [facility])  Right   CAD (coronary artery disease)    Cath 2010 with DES x 1 RCA-- PT'S CARDIOLOGIST IS DR. MCALHANY   Chronic diastolic congestive heart failure (HCC)    COPD (chronic obstructive pulmonary disease) ([place])    Depression    Diabetes mellitus DIAGNOSED IN2010   Controll s with diet   Dyspnea    with much ambulation   Eczema    on back   Headache    migraines younger- rare now 02/07/21   Heart murmur    History of hiatal hernia    History of kidney stones    passed or blasted   Hyperlipidemia    Hypertension    Morbid obesity with body mass index of 60.0-69.9 in adult [facility])    Myocardial infarction ([place])    PT THINKS SHE WAS DX WITH MI AT THE TIME OF HEART STENTING   Neuromuscular disorder ([place])    bilateral hands   Personal history of radiation therapy    Pneumonia    Pulmonary embolism ([place]) 02/08/2012   S/P RT TOTAL KNEE ON 02/03/12--ON 02/08/12--DEVELOPED ACUTE SOB AND CHEST PAIN--AND DIAGNOSED WITH  PULMONARY EMBOLUS AND PNEUMONIA   Restless leg syndrome    Sleep apnea    uses 3 liters O2 at  night    Uterine fibroid    NO PROBLEMS AT PRESENT FROM THE FIBROIDS-STATES SHE IS POST MENOPAUSAL-LAST MENSES 2010 EXCEPT FOR EPISODE THIS YR OF BLEEDING RELATED TO FIBROIDS.   Weakness    BOTH HANDS - S/P BILATERAL CARPAL TUNNEL RELEASE--BUT STILL HAS WEAKNESS--OFTEN DROPS THINGS    Family History  Problem Relation Age of Onset   Breast cancer Mother        stage IV at diagnosis   Emphysema Mother        smoked   Heart disease Mother    COPD Father        smoked   Asthma Father    Heart disease Father    Cancer Brother        Sinus    Past Surgical History:  Procedure Laterality Date   BREAST BIOPSY Right 06/04/2018   BREAST LUMPECTOMY Right 06/2018   BREAST LUMPECTOMY WITH RADIOACTIVE SEED AND SENTINEL LYMPH NODE BIOPSY Right 07/19/2018   Procedure: BREAST LUMPECTOMY WITH RADIOACTIVE SEED AND SENTINEL LYMPH NODE BIOPSY;  Surgeon: Alphonsa Overall, MD;  Location: [facility];  Service: General;  Laterality: Right;   CARDIAC CATHETERIZATION     CARDIAC VALVE REPLACEMENT     2017   CARPAL TUNNEL RELEASE     Bilateral   CHOLECYSTECTOMY     CORONARY ANGIOPLASTY     2010 has stent in place   CYSTOSCOPY W/ RETROGRADES Right 09/21/2013   Procedure: CYSTOSCOPY WITH RIGHT RETROGRADE PYELOGRAM RIGHT DOUBLE J STENT ;  Surgeon: Fredricka Bonine, MD;  Location[facility];  Service: Urology;  Laterality: Right;   CYSTOSCOPY WITH URETEROSCOPY AND STENT PLACEMENT Right 10/25/2013   Procedure: CYSTOSCOPY RIGHT URETEROSCOPY HOLMIUM LASER LITHO AND STENT PLACEMENT;  Surgeon: Fredricka Bonine, MD;  Location: [facility];  Service: Urology;  Laterality: Right;   HERNIA REPAIR     INTRAOPERATIVE TRANSESOPHAGEAL ECHOCARDIOGRAM N/A 12/12/2014   Procedure: INTRAOPERATIVE TRANSESOPHAGEAL ECHOCARDIOGRAM;  Surgeon: [place] Blanks, MD;  Location: [place];  Service: Cardiovascular;  Laterality: N/A;   JOINT REPLACEMENT     bil total knees   KNEE ARTHROPLASTY  02/03/2012   Procedure: COMPUTER  ASSISTED TOTAL KNEE ARTHROPLASTY;  Surgeon: Mcarthur Rossetti, MD;  Location: [facility];  Service: Orthopedics;  Laterality: Right;  Right total knee arthroplasty   LEFT AND RIGHT HEART CATHETERIZATION WITH CORONARY ANGIOGRAM N/A 03/17/2013   Procedure: LEFT AND RIGHT HEART CATHETERIZATION WITH CORONARY ANGIOGRAM;  Surgeon: [place] Blanks, MD;  Location: [facility] CATH LAB;  Service: Cardiovascular;  Laterality: N/A;   LEFT AND RIGHT HEART CATHETERIZATION WITH CORONARY/GRAFT ANGIOGRAM N/A 09/14/2014   Procedure: LEFT AND RIGHT HEART CATHETERIZATION WITH Beatrix Fetters;  Surgeon: [place] Blanks, MD;  Location: [facility] CATH LAB;  Service: Cardiovascular;  Laterality: N/A;   TEE WITHOUT CARDIOVERSION N/A 03/14/2013   Procedure: TRANSESOPHAGEAL ECHOCARDIOGRAM (TEE);  Surgeon: Lelon Perla, MD;  Location: [facility] ENDOSCOPY;  Service: Cardiovascular;  Laterality: N/A;   TEE WITHOUT CARDIOVERSION N/A 11/14/2014   Procedure: TRANSESOPHAGEAL ECHOCARDIOGRAM (TEE);  Surgeon: Lelon Perla, MD;  Location: [facility] ENDOSCOPY;  Service: Cardiovascular;  Laterality: N/A;   TONSILLECTOMY     maybe as a child- does not know   TOTAL KNEE ARTHROPLASTY  09/10/2012   Procedure: TOTAL KNEE ARTHROPLASTY;  Surgeon: Mcarthur Rossetti, MD;  [facility];  Service: Orthopedics;  Laterality: Left;   TOTAL KNEE REVISION Right 07/15/2013   Procedure: REVISION ARTHROPLASTY RIGHT KNEE;  Surgeon: Mcarthur Rossetti, MD;  [facility];  Service: Orthopedics;  Laterality: Right;   TRANSCATHETER AORTIC VALVE REPLACEMENT, TRANSFEMORAL N/A 12/12/2014   Procedure: TRANSCATHETER AORTIC VALVE REPLACEMENT, TRANSFEMORAL;  Surgeon: [place] Blanks, MD;  Location: [place];  Service: Cardiovascular;  Laterality: N/A;   TRIGGER FINGER RELEASE  09/10/2012   Procedure: RELEASE TRIGGER FINGER/A-1 PULLEY;  Surgeon: Mcarthur Rossetti, MD;  [facility];  Service: Orthopedics;  Laterality: Right;  Right Ring  Finger   TUBAL LIGATION     Social History   Occupational History   Occupation: Disabled  Tobacco Use   Smoking status: Former    Packs/day: 1.50    Years: 30.00    Pack years: 45.00    Types: Cigarettes    Quit date: 12/29/2000    Years since quitting: 20.6   Smokeless tobacco: Never  Vaping Use   Vaping Use: Never used  Substance and Sexual Activity   Alcohol use: Not Currently   Drug use: Not Currently   Sexual activity: Not Currently    Birth control/protection: Surgical

## 2021-08-20 ENCOUNTER — Other Ambulatory Visit: Payer: Self-pay | Admitting: Internal Medicine

## 2021-08-20 DIAGNOSIS — J441 Chronic obstructive pulmonary disease with (acute) exacerbation: Secondary | ICD-10-CM

## 2021-08-21 ENCOUNTER — Other Ambulatory Visit: Payer: Self-pay

## 2021-08-21 ENCOUNTER — Ambulatory Visit (INDEPENDENT_AMBULATORY_CARE_PROVIDER_SITE_OTHER): Payer: Medicaid Other | Admitting: Student

## 2021-08-21 ENCOUNTER — Encounter: Payer: Self-pay | Admitting: Orthopedic Surgery

## 2021-08-21 DIAGNOSIS — Z760 Encounter for issue of repeat prescription: Secondary | ICD-10-CM | POA: Diagnosis not present

## 2021-08-21 DIAGNOSIS — J441 Chronic obstructive pulmonary disease with (acute) exacerbation: Secondary | ICD-10-CM

## 2021-08-21 DIAGNOSIS — I2510 Atherosclerotic heart disease of native coronary artery without angina pectoris: Secondary | ICD-10-CM

## 2021-08-21 DIAGNOSIS — E119 Type 2 diabetes mellitus without complications: Secondary | ICD-10-CM

## 2021-08-21 MED ORDER — NITROGLYCERIN 0.4 MG SL SUBL: 0.4000 mg | SUBLINGUAL_TABLET | SUBLINGUAL | 0 refills | Status: DC | PRN

## 2021-08-21 MED ORDER — PROAIR HFA 108 (90 BASE) MCG/ACT IN AERS: INHALATION_SPRAY | RESPIRATORY_TRACT | 2 refills | Status: DC

## 2021-08-21 MED ORDER — INSULIN PEN NEEDLE 30G X 5 MM MISC: 1.0000 "application " | Freq: Every day | 1 refills | Status: DC

## 2021-08-21 MED ORDER — ACCU-CHEK GUIDE VI STRP: ORAL_STRIP | 9 refills | Status: DC

## 2021-08-21 NOTE — Assessment & Plan Note (Signed)
[name] is presenting via telephone today to discuss refilling some of her mediations. She reports she needs new refills on nitroglycerin. Mentions that she has only had to take this once "years" ago, but has been stable since. She also is requesting refills for Trelegy and ProAir. Reports that she feels her breathing has improved since starting Trelegy and has been able to start weaning off supplemental oxygen. [name] states she has to use ProAir inhaler 2-3 times weekly.   Patient is also requesting new needles for Victoza. Says her current needles (32G) are too thin and bend too easily. She also requests new test strips.   [name] has not been seen by our clinic in >8 months. I discussed having a follow-up in-person visit with me when available. She is agreeable to plan and will schedule an appointment in November.  - Refilled nitroglycerin, Trelegy, ProAir - Refilled test strips - Switch needles to 30G - Needs in-person appointment

## 2021-08-21 NOTE — Progress Notes (Signed)
  [facility] Telephone Encounter Continuity Care Appointment  HPI:  This telephone encounter was created for [name] on 08/21/2021 for the following purpose/cc medication re-fills.   Past Medical History:  Past Medical History:  Diagnosis Date   Anemia    Aortic valve stenosis, severe    Arthritis    PAIN AND SEVERE OA LEFT KNEE ; S/P RIGHT TKA ON 02/03/12; HAS LOWER BACK PAIN-UNABLE TO STAND MORE THAN 10 MIN; ARTHRITIS "ALL OVER"   Asthma    Blood transfusion    2013[facility]   Breast cancer in female [facility])    Right   CAD (coronary artery disease)    Cath 2010 with DES x 1 RCA-- PT'S CARDIOLOGIST IS DR. MCALHANY   Chronic diastolic congestive heart failure (HCC)    COPD (chronic obstructive pulmonary disease) ([place])    Depression    Diabetes mellitus DIAGNOSED IN2010   Controll s with diet   Dyspnea    with much ambulation   Eczema    on back   Headache    migraines younger- rare now 02/07/21   Heart murmur    History of hiatal hernia    History of kidney stones    passed or blasted   Hyperlipidemia    Hypertension    Morbid obesity with body mass index of 60.0-69.9 in adult [facility])    Myocardial infarction ([place])    PT THINKS SHE WAS DX WITH MI AT THE TIME OF HEART STENTING   Neuromuscular disorder ([place])    bilateral hands   Personal history of radiation therapy    Pneumonia    Pulmonary embolism ([place]) 02/08/2012   S/P RT TOTAL KNEE ON 02/03/12--ON 02/08/12--DEVELOPED ACUTE SOB AND CHEST PAIN--AND DIAGNOSED WITH  PULMONARY EMBOLUS AND PNEUMONIA   Restless leg syndrome    Sleep apnea    uses 3 liters O2 at night    Uterine fibroid    NO PROBLEMS AT PRESENT FROM THE FIBROIDS-STATES SHE IS POST MENOPAUSAL-LAST MENSES 2010 EXCEPT FOR EPISODE THIS YR OF BLEEDING RELATED TO FIBROIDS.   Weakness    BOTH HANDS - S/P BILATERAL CARPAL TUNNEL RELEASE--BUT STILL HAS WEAKNESS--OFTEN DROPS THINGS     ROS:  As per HPI   Assessment / Plan /  Recommendations:  Please see A&P under problem oriented charting for assessment of the patient's acute and chronic medical conditions.  As always, pt is advised that if symptoms worsen or new symptoms arise, they should go to an urgent care facility or to to ER for further evaluation.   Consent and Medical Decision Making:  Patient discussed with Dr. Dareen Piano This is a telephone encounter between [place] on 08/21/2021 for medication refills. The visit was conducted with the patient located at home and [name] at [facility]. The patient's identity was confirmed using their DOB and current address. The patient has consented to being evaluated through a telephone encounter and understands the associated risks (an examination cannot be done and the patient may need to come in for an appointment) / benefits (allows the patient to remain at home, decreasing exposure to coronavirus). I personally spent 9 minutes on medical discussion.

## 2021-08-21 NOTE — Telephone Encounter (Signed)
Pt has tele-health appt scheduled for today to discuss refills.  Last ov was 12/10/2020

## 2021-08-25 ENCOUNTER — Ambulatory Visit
Admission: RE | Admit: 2021-08-25 | Discharge: 2021-08-25 | Disposition: A | Discharge status: Home / Self Care | Payer: Medicaid Other | Source: Ambulatory Visit | Attending: Orthopedic Surgery | Admitting: Orthopedic Surgery

## 2021-08-25 DIAGNOSIS — M4802 Spinal stenosis, cervical region: Secondary | ICD-10-CM | POA: Diagnosis not present

## 2021-08-25 DIAGNOSIS — M47812 Spondylosis without myelopathy or radiculopathy, cervical region: Secondary | ICD-10-CM | POA: Diagnosis not present

## 2021-08-25 DIAGNOSIS — M542 Cervicalgia: Secondary | ICD-10-CM

## 2021-08-25 DIAGNOSIS — M2578 Osteophyte, vertebrae: Secondary | ICD-10-CM | POA: Diagnosis not present

## 2021-08-25 IMAGING — MR MR CERVICAL SPINE W/O CM
5 of 6 series · 31 of 48 positions shown · non-contrast
Comparison: Cervical radiographs [DATE].

CLINICAL DATA: Cervicalgia [E8] ([E8]-CM)

Evaluate for source of radicular left arm pain with numbness.
EXAM:
MRI CERVICAL SPINE WITHOUT CONTRAST
TECHNIQUE: Multiplanar, multisequence MR imaging of the cervical spine was
performed. No intravenous contrast was administered.

[Series 2: T2 · sagittal · 3.0mm · 0.66mm/px · 5 of 13 slices shown (1 of 3)]
[im 1/13]
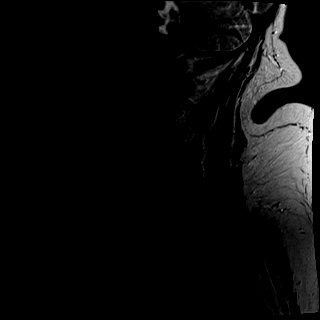
[im 4/13]
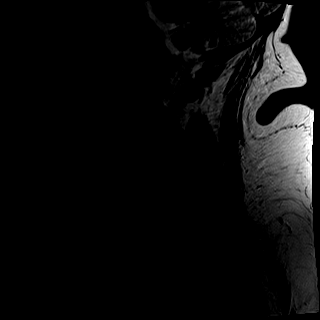
[im 7/13]
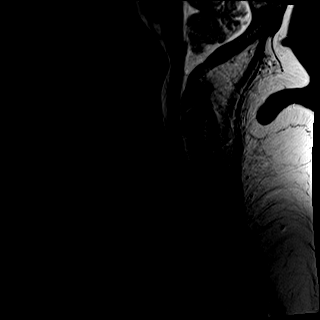
[im 10/13]
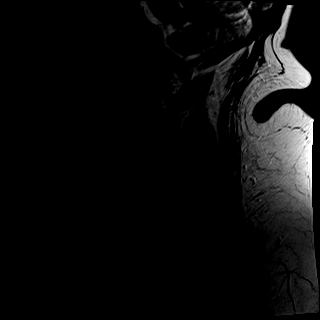
[im 13/13]
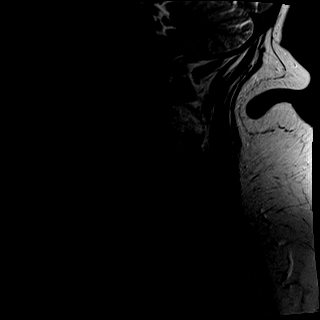

[Series 3: T1 · sagittal · 3.0mm · 0.41mm/px · 5 of 13 slices shown]
[im 1/13]
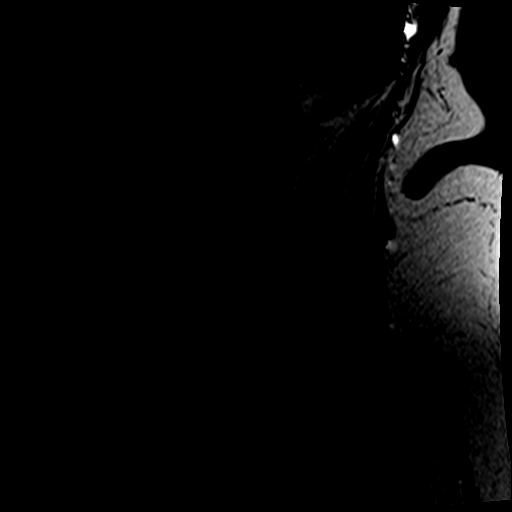
[im 4/13]
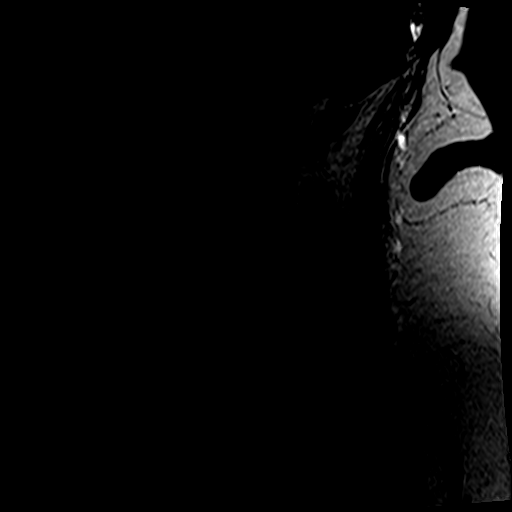
[im 7/13]
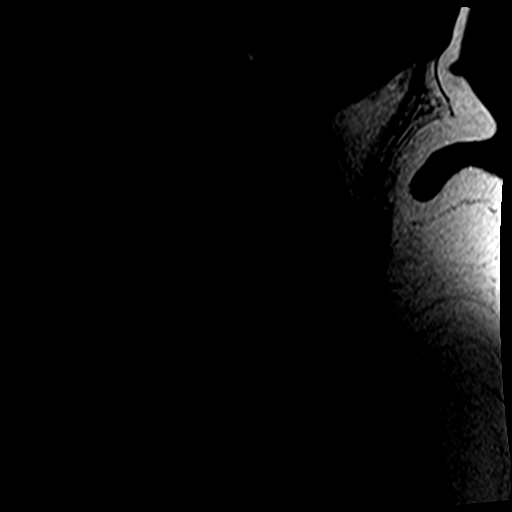
[im 10/13]
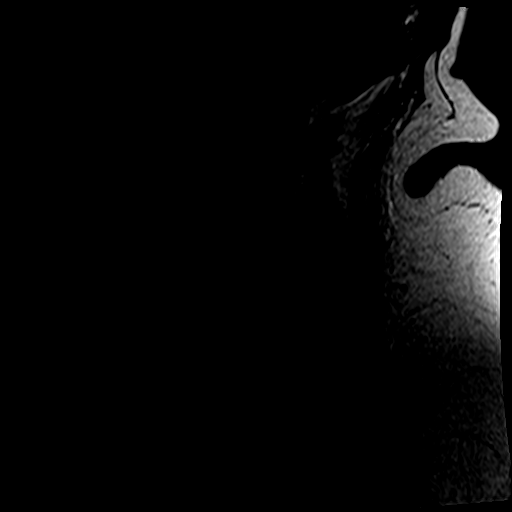
[im 13/13]
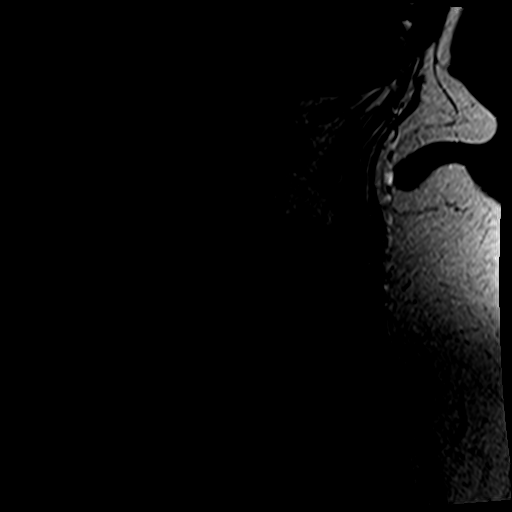

[Series 4: tir sag · sagittal · 3.0mm · 0.41mm/px · 6 of 13 slices shown]
[im 1/13]
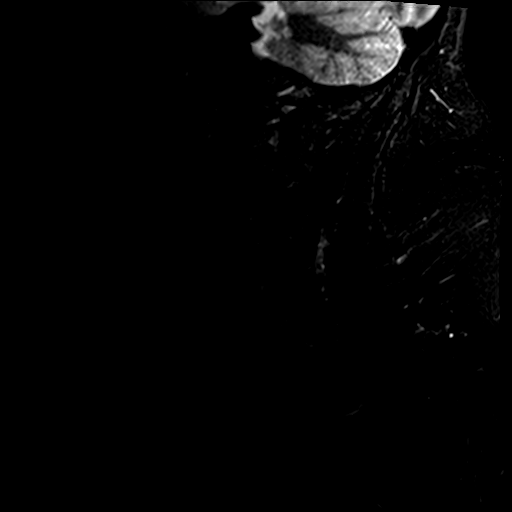
[im 3/13]
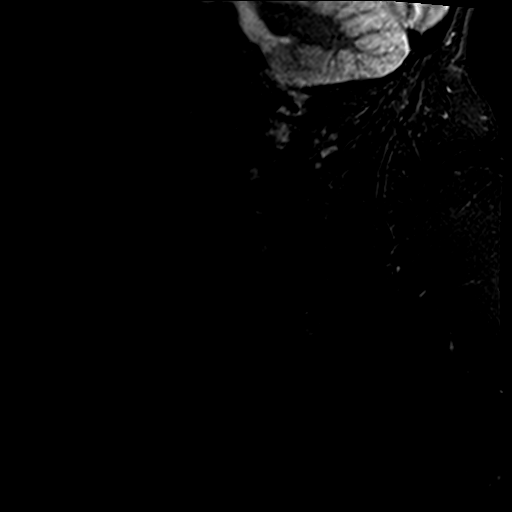
[im 5/13]
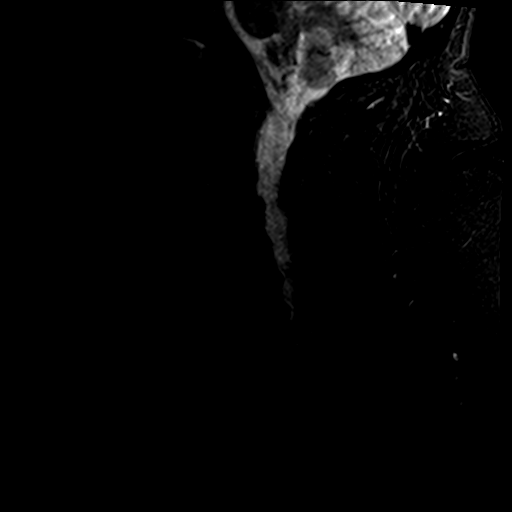
[im 8/13]
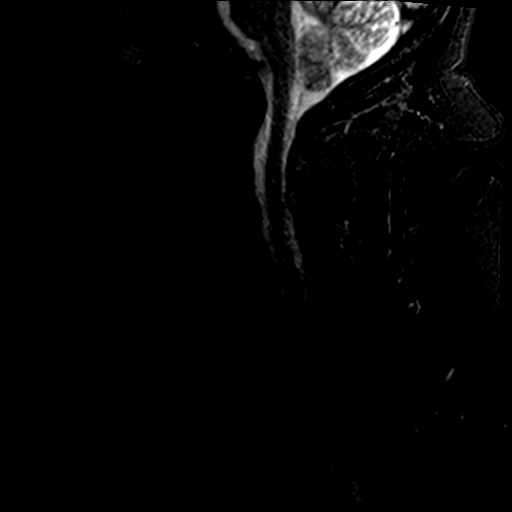
[im 10/13]
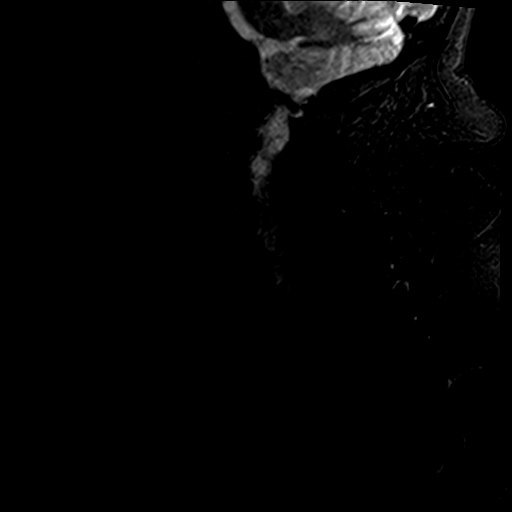
[im 13/13]
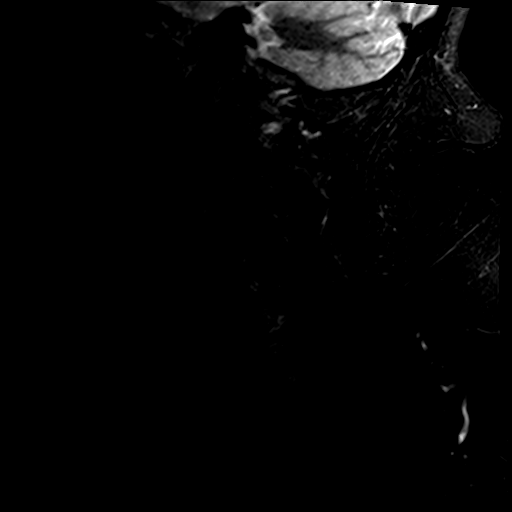

[Series 6: T2 · axial · 3.0mm · 0.70mm/px · z∈[-124,-20]mm · 9 of 29 slices shown (2 of 3)]
[im 1/29]
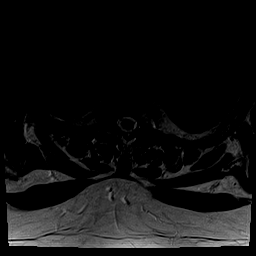
[im 5/29]
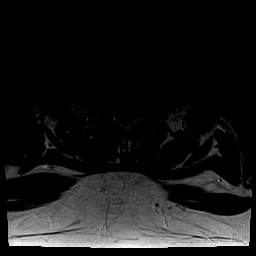
[im 10/29]
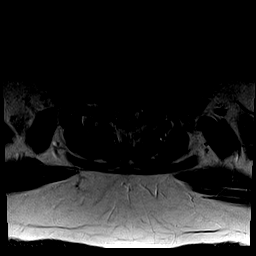
[im 12/29]
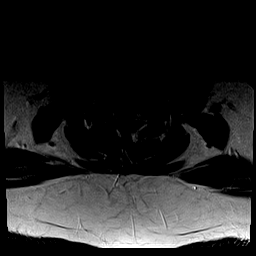
[im 15/29]
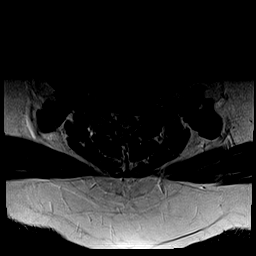
[im 17/29]
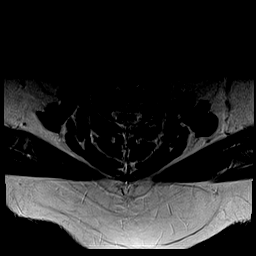
[im 19/29]
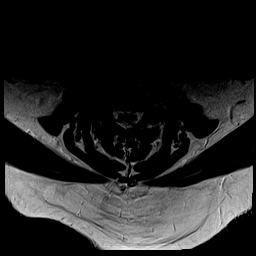
[im 24/29]
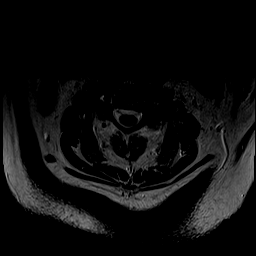
[im 29/29]
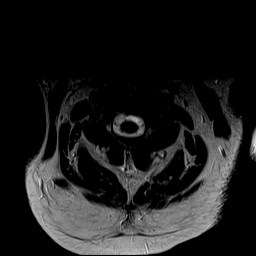

[Series 7: T2 · sagittal · 3.0mm · 0.66mm/px · 6 of 13 slices shown (3 of 3)]
[im 1/13]
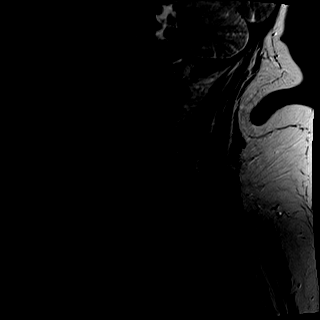
[im 3/13]
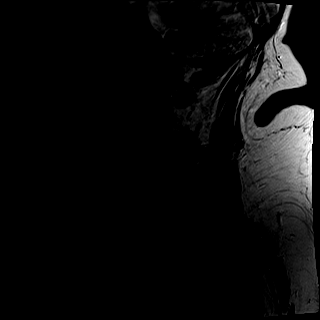
[im 5/13]
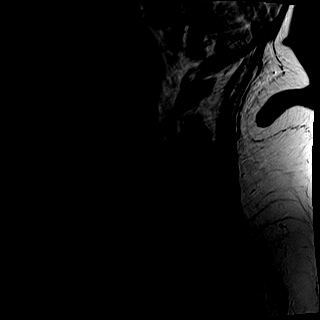
[im 8/13]
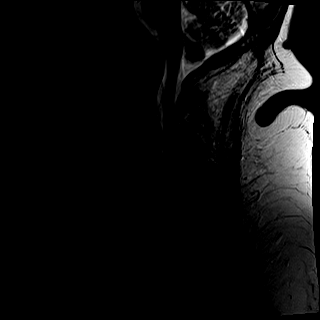
[im 10/13]
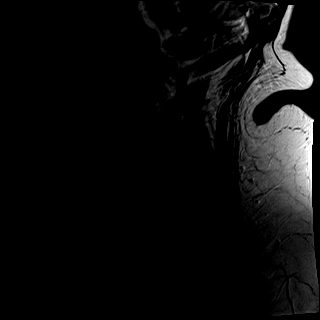
[im 13/13]
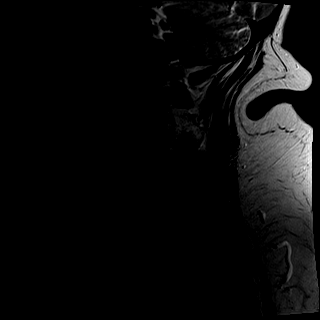

[31 of 48 positions shown; findings below may reference images not displayed]

FINDINGS: Moderate to severe motion limited exam despite repeated series.
Within this limitation:

Alignment: Straightening without substantial sagittal subluxation

Vertebrae: No focal marrow edema to suggest acute fracture or
discitis/osteomyelitis. Vertebral body heights are maintained. No
obvious suspicious bone lesions.

Cord: Motion limited evaluation without evidence of abnormal cord
signal.

Posterior Fossa, vertebral arteries, paraspinal tissues: The
visualized vertebral artery flow voids are grossly maintained.
Grossly unremarkable visualized posterior fossa.

Disc levels:

C2-C3: Mild left facet hypertrophy without significant canal or
foraminal stenosis.

C3-C4: Posterior disc osteophyte complex and bilateral
facet/uncovertebral hypertrophy. Limited evaluation of foramina due
to motion with suspected mild bilateral foraminal stenosis. No
significant canal stenosis.

C4-C5: Small posterior disc osteophyte complex. Bilateral
facet/uncovertebral hypertrophy. Motion limited evaluation of the
foramina with suspected mild bilateral foraminal stenosis. No
significant canal stenosis.

C5-C6: Posterior disc osteophyte complex with left greater than
right facet and uncovertebral hypertrophy. Significantly motion
limited evaluation of foramina with potentially mild left greater
than right foraminal stenosis. Likely mild canal stenosis.

C6-C7: Posterior disc osteophyte complex and ligamentum flavum
thickening with likely mild to moderate canal stenosis. Bilateral
facet uncovertebral hypertrophy. Significantly motion limited
evaluation of foramina with potentially moderate bilateral foraminal
stenosis.

C7-T1: Significantly motion limited evaluation without evidence of
significant canal stenosis.
IMPRESSION: Moderate to severe motion degradation. Likely mild-to-moderate canal
stenosis C6-C7 and mild canal stenosis at C5-C6. Potentially
moderate foraminal stenosis at C6-C7 and mild foraminal stenosis at
C5-C6, although confident assessment of the foramina is not possible
due to motion. A CT myelogram or repeat MRI with sedation may be
helpful to better characterize, if clinically indicated.

## 2021-08-26 NOTE — Progress Notes (Signed)
Internal Medicine Clinic Attending ? ?Case discussed with Dr. Braswell  At the time of the visit.  We reviewed the resident?s history and exam and pertinent patient test results.  I agree with the assessment, diagnosis, and plan of care documented in the resident?s note.  ?

## 2021-08-29 DIAGNOSIS — Z419 Encounter for procedure for purposes other than remedying health state, unspecified: Secondary | ICD-10-CM | POA: Diagnosis not present

## 2021-09-03 ENCOUNTER — Other Ambulatory Visit: Payer: Self-pay | Admitting: Internal Medicine

## 2021-09-03 ENCOUNTER — Other Ambulatory Visit: Payer: Medicaid Other

## 2021-09-03 ENCOUNTER — Other Ambulatory Visit: Payer: Self-pay | Admitting: Surgery

## 2021-09-03 DIAGNOSIS — F3342 Major depressive disorder, recurrent, in full remission: Secondary | ICD-10-CM

## 2021-09-03 MED ORDER — TRAMADOL HCL 50 MG PO TABS: 50.0000 mg | ORAL_TABLET | Freq: Three times a day (TID) | ORAL | 0 refills | Status: DC | PRN

## 2021-09-03 NOTE — Addendum Note (Signed)
Addended by: Minda Ditto, Alyse Low N on: 09/03/2021 01:43 PM   Modules accepted: Orders

## 2021-09-03 NOTE — Telephone Encounter (Signed)
Pt would like to know if can send a refill in for tramadol. [facility] on [address].   CB [phone]

## 2021-09-04 ENCOUNTER — Encounter: Payer: Self-pay | Admitting: Physical Therapy

## 2021-09-04 ENCOUNTER — Other Ambulatory Visit: Payer: Self-pay

## 2021-09-04 ENCOUNTER — Ambulatory Visit: Payer: Medicaid Other | Attending: Oncology | Admitting: Physical Therapy

## 2021-09-04 DIAGNOSIS — M6281 Muscle weakness (generalized): Secondary | ICD-10-CM | POA: Insufficient documentation

## 2021-09-04 DIAGNOSIS — R279 Unspecified lack of coordination: Secondary | ICD-10-CM | POA: Diagnosis not present

## 2021-09-04 NOTE — Patient Instructions (Signed)
Moisturizers . They are used in the vagina to hydrate the mucous membrane that make up the vaginal canal. . Designed to keep a more normal acid balance (ph) . Once placed in the vagina, it will last between two to three days.  . Use 2-3 times per week at bedtime  . Ingredients to avoid is glycerin and fragrance, can increase chance of infection . Should not be used just before sex due to causing irritation . Most are gels administered either in a tampon-shaped applicator or as a vaginal suppository. They are non-hormonal.   Types of Moisturizers(internal use)  . Vitamin E vaginal suppositories- Whole foods, Amazon . Moist Again . Coconut oil- can break down condoms . Julva- (Do no use if on Tamoxifen) amazon . Yes moisturizer- amazon . NeuEve Silk , NeuEve Silver for menopausal or over 65 (if have severe vaginal atrophy or cancer treatments use NeuEve Silk for  1 month than move to NeuEve Silver)- Amazon, Neuve.com . Olive and Bee intimate cream- www.oliveandbee.com.au . Mae vaginal moisturizer- Amazon . Aloe .    Creams to use externally on the Vulva area  Desert Harvest Releveum (good for for cancer patients that had radiation to the area)- amazon or www.desertharvest.com  V-magic cream - amazon  Julva-amazon  Vital "V Wild Yam salve ( help moisturize and help with thinning vulvar area, does have Beeswax  MoodMaid Botanical Pro-Meno Wild Yam Cream- Amazon  Desert Harvest Gele  Cleo by Damiva labial moisturizer (Amazon,   Coconut or olive oil  aloe   Things to avoid in the vaginal area . Do not use things to irritate the vulvar area . No lotions just specialized creams for the vulva area- Neogyn, V-magic, No soaps; can use Aveeno or Calendula cleanser if needed. Must be gentle . No deodorants . No douches . Good to sleep without underwear to let the vaginal area to air out . No scrubbing: spread the lips to let warm water rinse over labias and pat dry  [facility] [address], [address] [place], [place] [address] Phone # [phone] Fax [phone]  

## 2021-09-04 NOTE — Therapy (Addendum)
[facility] [address], [place], [place], [address] Phone: [phone]   Fax:  [phone]  Physical Therapy Evaluation  Patient Details  Name: [name] MRN: [id number] Date of Birth: 19-Apr-1961 Referring Provider (PT): Magrinat, Virgie Dad, MD   Encounter Date: 09/04/2021   PT End of Session - 09/04/21 1246     Visit Number 1    Date for PT Re-Evaluation 11/27/21    Authorization Type mcaid prep Jackquline [place] [id number]    PT Start Time 1234    PT Stop Time [id number]    PT Time Calculation (min) 41 min    Activity Tolerance Patient tolerated treatment well    Behavior During Therapy [facility] for tasks assessed/performed             Past Medical History:  Diagnosis Date   Anemia    Aortic valve stenosis, severe    Arthritis    PAIN AND SEVERE OA LEFT KNEE ; S/P RIGHT TKA ON 02/03/12; HAS LOWER BACK PAIN-UNABLE TO STAND MORE THAN 10 MIN; ARTHRITIS "ALL OVER"   Asthma    Blood transfusion    2013[facility]   Breast cancer in female [facility])    Right   CAD (coronary artery disease)    Cath 2010 with DES x 1 RCA-- PT'S CARDIOLOGIST IS DR. MCALHANY   Chronic diastolic congestive heart failure (HCC)    COPD (chronic obstructive pulmonary disease) ([place])    Depression    Diabetes mellitus DIAGNOSED IN2010   Controll s with diet   Dyspnea    with much ambulation   Eczema    on back   Headache    migraines younger- rare now 02/07/21   Heart murmur    History of hiatal hernia    History of kidney stones    passed or blasted   Hyperlipidemia    Hypertension    Morbid obesity with body mass index of 60.0-69.9 in adult [facility])    Myocardial infarction ([place])    PT THINKS SHE WAS DX WITH MI AT THE TIME OF HEART STENTING   Neuromuscular disorder ([place])    bilateral hands   Personal history of radiation therapy    Pneumonia    Pulmonary embolism ([place]) 02/08/2012   S/P RT TOTAL KNEE ON 02/03/12--ON 02/08/12--DEVELOPED ACUTE SOB  AND CHEST PAIN--AND DIAGNOSED WITH  PULMONARY EMBOLUS AND PNEUMONIA   Restless leg syndrome    Sleep apnea    uses 3 liters O2 at night    Uterine fibroid    NO PROBLEMS AT PRESENT FROM THE FIBROIDS-STATES SHE IS POST MENOPAUSAL-LAST MENSES 2010 EXCEPT FOR EPISODE THIS YR OF BLEEDING RELATED TO FIBROIDS.   Weakness    BOTH HANDS - S/P BILATERAL CARPAL TUNNEL RELEASE--BUT STILL HAS WEAKNESS--OFTEN DROPS THINGS    Past Surgical History:  Procedure Laterality Date   BREAST BIOPSY Right 06/04/2018   BREAST LUMPECTOMY Right 06/2018   BREAST LUMPECTOMY WITH RADIOACTIVE SEED AND SENTINEL LYMPH NODE BIOPSY Right 07/19/2018   Procedure: BREAST LUMPECTOMY WITH RADIOACTIVE SEED AND SENTINEL LYMPH NODE BIOPSY;  Surgeon: Alphonsa Overall, MD;  Location: [place];  Service: General;  Laterality: Right;   CARDIAC CATHETERIZATION     CARDIAC VALVE REPLACEMENT     2017   CARPAL TUNNEL RELEASE     Bilateral   CHOLECYSTECTOMY     CORONARY ANGIOPLASTY     2010 has stent in place   CYSTOSCOPY W/ RETROGRADES Right 09/21/2013   Procedure: CYSTOSCOPY WITH  RIGHT RETROGRADE PYELOGRAM RIGHT DOUBLE J STENT ;  Surgeon: Fredricka Bonine, MD;  Location: [facility];  Service: Urology;  Laterality: Right;   CYSTOSCOPY WITH URETEROSCOPY AND STENT PLACEMENT Right 10/25/2013   Procedure: CYSTOSCOPY RIGHT URETEROSCOPY HOLMIUM LASER LITHO AND STENT PLACEMENT;  Surgeon: Fredricka Bonine, MD;  Location: [facility];  Service: Urology;  Laterality: Right;   HERNIA REPAIR     INTRAOPERATIVE TRANSESOPHAGEAL ECHOCARDIOGRAM N/A 12/12/2014   Procedure: INTRAOPERATIVE TRANSESOPHAGEAL ECHOCARDIOGRAM;  Surgeon: Burnell Blanks, MD;  Location: [place];  Service: Cardiovascular;  Laterality: N/A;   JOINT REPLACEMENT     bil total knees   KNEE ARTHROPLASTY  02/03/2012   Procedure: COMPUTER ASSISTED TOTAL KNEE ARTHROPLASTY;  Surgeon: Mcarthur Rossetti, MD;  Location: [place];  Service: Orthopedics;  Laterality: Right;  Right  total knee arthroplasty   LEFT AND RIGHT HEART CATHETERIZATION WITH CORONARY ANGIOGRAM N/A 03/17/2013   Procedure: LEFT AND RIGHT HEART CATHETERIZATION WITH CORONARY ANGIOGRAM;  Surgeon: Burnell Blanks, MD;  Location: [facility] C[facility] LAB;  Service: Cardiovascular;  Laterality: N/A;   LEFT AND RIGHT HEART CATHETERIZATION WITH CORONARY/GRAFT ANGIOGRAM N/A 09/14/2014   Procedure: LEFT AND RIGHT HEART CATHETERIZATION WITH Beatrix Fetters;  Surgeon: Burnell Blanks, MD;  Location: [facility] CATH LAB;  Service: Cardiovascular;  Laterality: N/A;   TEE WITHOUT CARDIOVERSION N/A 03/14/2013   Procedure: TRANSESOPHAGEAL ECHOCARDIOGRAM (TEE);  Surgeon: Lelon Perla, MD;  Location: [facility] ENDOSCOPY;  Service: Cardiovascular;  Laterality: N/A;   TEE WITHOUT CARDIOVERSION N/A 11/14/2014   Procedure: TRANSESOPHAGEAL ECHOCARDIOGRAM (TEE);  Surgeon: Lelon Perla, MD;  Location: [facility] ENDOSCOPY;  Service: Cardiovascular;  Laterality: N/A;   TONSILLECTOMY     maybe as a child- does not know   TOTAL KNEE ARTHROPLASTY  09/10/2012   Procedure: TOTAL KNEE ARTHROPLASTY;  Surgeon: Mcarthur Rossetti, MD;  [facility];  Service: Orthopedics;  Laterality: Left;   TOTAL KNEE REVISION Right 07/15/2013   Procedure: REVISION ARTHROPLASTY RIGHT KNEE;  Surgeon: Mcarthur Rossetti, MD;  [facility];  Service: Orthopedics;  Laterality: Right;   TRANSCATHETER AORTIC VALVE REPLACEMENT, TRANSFEMORAL N/A 12/12/2014   Procedure: TRANSCATHETER AORTIC VALVE REPLACEMENT, TRANSFEMORAL;  Surgeon: Burnell Blanks, MD;  Location: [place];  Service: Cardiovascular;  Laterality: N/A;   TRIGGER FINGER RELEASE  09/10/2012   Procedure: RELEASE TRIGGER FINGER/A-1 PULLEY;  Surgeon: Mcarthur Rossetti, MD;  Location[facility];  Service: Orthopedics;  Laterality: Right;  Right Ring Finger   TUBAL LIGATION      There were no vitals filed for this visit.    Subjective Assessment - 09/04/21 1238     Subjective I  change my pads about 4x/day.  I had leakage since having babies but has beeen worse and since the last 4 month I noticed not having any control.  I have the urgency that gets so strong as I am getting to the bathroom.  If I pull up to the house I get so much urgency.  I am not sexually active with husband, but I have some burning pain sometimes.    Pertinent History back surgery, falls, TKA bil    Patient Stated Goals stop leaking so much    Currently in Pain? Yes    Pain Score 4    at the most               [facility] PT Assessment - 09/04/21 0001       Assessment   Medical Diagnosis C50.411,Z17.0 (ICD-10-CM) - Malignant  neoplasm of upper-outer quadrant of right breast in female, estrogen receptor positive ([place]); E11.9 (ICD-10-CM) - Type 2 diabetes mellitus without complication, without long-term current use of insulin ([place])    Referring Provider (PT) Magrinat, Virgie Dad, MD    Onset Date/Surgical Date --   last 4 months have been very irritated   Prior Therapy No      Precautions   Precautions None      Restrictions   Weight Bearing Restrictions No      Balance Screen   Has the patient fallen in the past 6 months Yes    How many times? --   several times - has a small dog and tripped unable to get leg up high enough   Has the patient had a decrease in activity level because of a fear of falling?  No    Is the patient reluctant to leave their home because of a fear of falling?  No      Home Ecologist residence    Living Arrangements Spouse/significant other      Prior Function   Level of Independence Independent    Leisure every day stuff; shopping, etc      Cognition   Overall Cognitive Status Within Functional Limits for tasks assessed      Functional Tests   Functional tests Single leg stance      Single Leg Stance   Comments trendelenburg on Lt side; needs UE support      Posture/Postural Control   Posture Comments obesity      ROM /  Strength   AROM / PROM / Strength AROM;PROM;Strength      AROM   Overall AROM Comments lumbar 40%      PROM   Overall PROM Comments Lt hip rotation 50%      Strength   Overall Strength Comments bil hip strength grossly 4/5; core strength 4-/5 with umbilical hernia and tenting      Flexibility   Soft Tissue Assessment /Muscle Length yes    Hamstrings 70%      Palpation   SI assessment  pelvic rotation to the left    Palpation comment lumbar paraspinals tight      Ambulation/Gait   Assistive device Straight cane    Gait Pattern Trendelenburg;Decreased weight shift to left                        Objective measurements completed on examination: See above findings.     Pelvic Floor Special Questions - 09/04/21 0001     Prior Pelvic/Prostate Exam Yes    Prior Pregnancies Yes    Number of Pregnancies 2   +1 miscarriage; 1 termination   Number of Vaginal Deliveries 2    Episiotomy Performed Yes    Currently Sexually Active No    Urinary Leakage Yes    Pad use 4/day    Activities that cause leaking Coughing;With strong urge;Sneezing   little squirt with coughing sneezing   Urinary urgency Yes    Urinary frequency going when only medium to large amount; going a lot due to fluid pills; nocturia 4-5x/night -2 nights    Fecal incontinence No   constipation and straining   Fluid intake cans of seltzer 5-6/day    Caffeine beverages sometimes tea; sometimes coffee    Falling out feeling (prolapse) No    Skin Integrity --   dryness   Prolapse --   not noticed  in supine   Pelvic Floor Internal Exam pt identity confirmed and internal soft tissue performed    Exam Type Vaginal    Palpation tight bil no tenderness    Strength weak squeeze, no lift    Strength # of reps 3    Strength # of seconds 1    Tone high              [facility] Adult PT Treatment/Exercise - 09/04/21 0001       Self-Care   Self-Care Other Self-Care Comments    Other Self-Care Comments   moisturizers and urge techniques             No emotional/communication barriers or cognitive limitation. Patient is motivated to learn. Patient understands and agrees with treatment goals and plan. PT explains patient will be examined in standing, sitting, and lying down to see how their muscles and joints work. When they are ready, they will be asked to remove their underwear so PT can examine their perineum. The patient is also given the option of providing their own chaperone as one is not provided in our facility. The patient also has the right and is explained the right to defer or refuse any part of the evaluation or treatment including the internal exam. With the patient's consent, PT will use one gloved finger to gently assess the muscles of the pelvic floor, seeing how well it contracts and relaxes and if there is muscle symmetry. After, the patient will get dressed and PT and patient will discuss exam findings and plan of care. PT and patient discuss plan of care, schedule, attendance policy and HEP activities.      Upper Extremity Functional Index Score:  /80     PT Short Term Goals - 09/04/21 1338       PT SHORT TERM GOAL #1   Title Pt will be Ind in an intial HEP.    Time 4    Period Weeks    Status New    Target Date 10/02/21      PT SHORT TERM GOAL #2   Title Ind with moisturizers for reduced pain and skin irritation    Time 4    Period Weeks    Status New    Target Date 10/02/21               PT Long Term Goals - 09/04/21 1338       PT LONG TERM GOAL #1   Title Pt will be ind with advanced HEP    Baseline does not know    Time 12    Period Weeks    Status New    Target Date 11/27/21      PT LONG TERM GOAL #2   Title Pt will report at least 75% improved ability to control bladder with urgency    Baseline full bladder leakage    Time 12    Period Weeks    Status New    Target Date 11/27/21      PT LONG TERM GOAL #3   Title Pt will report  using 50% less pads/day    Baseline 4/day      PT LONG TERM GOAL #4   Title Pt will be able to perform single leg balance for at least 3 seconds bilaterally without UE support for reduced risk of falls    Baseline needs UE support bil    Time 12    Period Weeks    Status  New    Target Date 11/27/21                    Plan - 09/04/21 1328     Clinical Impression Statement Pt presents to skilled PT due to increased incontinence and occasional pain due to vaginal dryness.  Pt demonstrates hip weakness Lt >Rt, core weakness with diastasis of rectus abdominals in the form of bulging and umbilical hernia as noted above.  Pt has increased lumbar lordosis, trendelenburge gait and SLS on Lt side.  Pt walks with cane for improved stability when on Lt side.  Hamstrings and lumbar paraspinals are tight bil.  Pt has pelvic floor strength of 2/5 due to weak squeeze but does demonstrate small lift.  Pt is not tender to palpation of the pelvic floor but tight throughout Rt>Lt.  Pt has decreased hip rotation of left hip. Pt needs mod assistance with bed mobility due to core and UE weakness. Pt can only do 1 sec pelvic floor contraction and repeats 3x before becoming fatigued.  Pt will benefit from skilled PT to address impairments and restore maximum function for improved quality of life.    Personal Factors and Comorbidities Comorbidity 3+    Comorbidities bil TKA, back surgery, breast cancer estrogen rec pos    Examination-Activity Limitations Toileting;Continence;Sleep    Examination-Participation Restrictions Community Activity    Stability/Clinical Decision Making Evolving/Moderate complexity    Clinical Decision Making Moderate    Rehab Potential Excellent    PT Frequency 1x / week    PT Duration 12 weeks    PT Treatment/Interventions ADLs/Self Care Home Management;Biofeedback;Aquatic Therapy;Cryotherapy;Electrical Stimulation;Neuromuscular re-education;Therapeutic exercise;Therapeutic  activities;Patient/family education;Gait training;Manual techniques;Dry needling;Passive range of motion;Taping    PT Next Visit Plan f/u on moisture; breathing and bulging and posterior chain stretches, core and kegel basic level 3 rep max with rest between    PT Home Exercise Plan may benefit from aquatic as an option if doesn't tolerate more advnaced exercises on land    Consulted and Agree with Plan of Care Patient             Patient will benefit from skilled therapeutic intervention in order to improve the following deficits and impairments:  Pain, Decreased strength, Decreased endurance, Postural dysfunction, Impaired flexibility, Increased fascial restricitons, Increased muscle spasms, Abnormal gait  Visit Diagnosis: Muscle weakness (generalized)  Unspecified lack of coordination     Problem List Patient Active Problem List   Diagnosis Date Noted   Encounter for medication refill 08/21/2021   Spondylolisthesis, lumbar region 02/11/2021    Class: Acute   Status post lumbar spinal fusion 02/11/2021   URI with cough and congestion 12/04/2020   Neuropathy of forearm, right 09/25/2020   Shoulder pain, left 09/25/2020   Tick bite 04/06/2020   Stiffness of hand joint 06/09/2019   Right arm numbness 06/09/2019   Low back pain 05/02/2019   Pain of left hip joint 08/09/2018   Malignant neoplasm of upper-outer quadrant of right breast in female, estrogen receptor positive ([place]) 06/10/2018   Screening for colon cancer 03/12/2018   Screening for breast cancer 03/12/2018   Restless leg syndrome 02/04/2018   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   Depression 08/06/2015   Hypertension associated with diabetes ([place])    Aortic stenosis    Chronic diastolic congestive heart failure ([place])    COPD (chronic obstructive pulmonary disease) ([place]) 09/21/2013   Obstructive sleep apnea 09/21/2013   Nocturnal hypoxemia 09/12/2013   Abnormal CT scan, chest  05/12/2013    Hyperlipidemia 11/30/2012   History of pulmonary embolus ([place]) 07/09/2012   Normocytic anemia 02/08/2012   Type 2 diabetes mellitus without complication, without long-term current use of insulin ([place]) 02/08/2012   Asthma 02/08/2012   GERD (gastroesophageal reflux disease)    CAD (coronary artery disease) 12/08/2011    Jule Ser, PT 09/04/2021, 1:58 PM  [facility] [address] [place], [place], [address] Phone: [phone]   Fax:  [phone]  Name: [name] MRN: [id number] Date of Birth: 1961/03/06

## 2021-09-06 ENCOUNTER — Telehealth: Payer: Self-pay | Admitting: Orthopedic Surgery

## 2021-09-06 NOTE — Telephone Encounter (Signed)
Pt calling to speak with Sabrina. Pt was told her Ct scan was denied but ins could not tell her why, they told her to call us and speak to [place]. The best call back number is [phone].

## 2021-09-06 NOTE — Telephone Encounter (Signed)
I called the patient: she was told by [facility] on 09/04/21 that the CT of her shoulder was not approved. As of yesterday afternoon (09/05/21), it has been approved, per the notes in the referral. She is scheduled for 09/17/21, but was hoping for sooner. I advised her she could try calling [facility] every few days to see if there has been a cancellation.

## 2021-09-09 ENCOUNTER — Other Ambulatory Visit: Payer: Medicaid Other

## 2021-09-09 ENCOUNTER — Ambulatory Visit (INDEPENDENT_AMBULATORY_CARE_PROVIDER_SITE_OTHER): Payer: Medicaid Other | Admitting: Orthopedic Surgery

## 2021-09-09 ENCOUNTER — Ambulatory Visit: Payer: Medicaid Other | Admitting: Physical Therapy

## 2021-09-09 ENCOUNTER — Encounter: Payer: Self-pay | Admitting: Orthopedic Surgery

## 2021-09-09 ENCOUNTER — Other Ambulatory Visit: Payer: Self-pay

## 2021-09-09 DIAGNOSIS — M542 Cervicalgia: Secondary | ICD-10-CM

## 2021-09-09 NOTE — Progress Notes (Signed)
Office Visit Note   Patient: [name]           Date of Birth: 31-Jul-1961           MRN: [id number] Visit Date: 09/09/2021 Requested by: [name], MD [place],  [place] [address] PCP: [name], MD  Subjective: Chief Complaint  Patient presents with   Neck - Follow-up    HPI: [name] is a 60 y.o. female who presents to the office for MRI review. Patient denies any changes in symptoms.  Continues to complain mainly of severe left shoulder pain as well as bilateral arm radicular pain with numbness and tingling primarily in the bilateral hands.  Pain is waking her up at night.  She has pain that radiates down from her neck to her hands in both arms as well as scapular pain bilaterally.  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  She has CT scan of the left shoulder currently scheduled for 09/17/2021 for preoperative planning purposes in anticipation of total shoulder arthroplasty.  MRI results revealed: MR Cervical Spine w/o contrast  Result Date: 08/26/2021 CLINICAL DATA:  Cervicalgia M54.2 (ICD-10-CM) Evaluate for source of radicular left arm pain with numbness. EXAM: MRI CERVICAL SPINE WITHOUT CONTRAST TECHNIQUE: Multiplanar, multisequence MR imaging of the cervical spine was performed. No intravenous contrast was administered. COMPARISON:  Cervical radiographs April 25, 2021. FINDINGS: Moderate to severe motion limited exam despite repeated series. Within this limitation: Alignment: Straightening without substantial sagittal subluxation Vertebrae: No focal marrow edema to suggest acute fracture or discitis/osteomyelitis. Vertebral body heights are maintained. No obvious suspicious bone lesions. Cord: Motion limited evaluation without evidence of abnormal cord signal. Posterior Fossa, vertebral arteries, paraspinal tissues: The visualized vertebral artery flow voids are grossly maintained. Grossly  unremarkable visualized posterior fossa. Disc levels: C2-C3: Mild left facet hypertrophy without significant canal or foraminal stenosis. C3-C4: Posterior disc osteophyte complex and bilateral facet/uncovertebral hypertrophy. Limited evaluation of foramina due to motion with suspected mild bilateral foraminal stenosis. No significant canal stenosis. C4-C5: Small posterior disc osteophyte complex. Bilateral facet/uncovertebral hypertrophy. Motion limited evaluation of the foramina with suspected mild bilateral foraminal stenosis. No significant canal stenosis. C5-C6: Posterior disc osteophyte complex with left greater than right facet and uncovertebral hypertrophy. Significantly motion limited evaluation of foramina with potentially mild left greater than right foraminal stenosis. Likely mild canal stenosis. C6-C7: Posterior disc osteophyte complex and ligamentum flavum thickening with likely mild to moderate canal stenosis. Bilateral facet uncovertebral hypertrophy. Significantly motion limited evaluation of foramina with potentially moderate bilateral foraminal stenosis. C7-T1: Significantly motion limited evaluation without evidence of significant canal stenosis. IMPRESSION: Moderate to severe motion degradation. Likely mild-to-moderate canal stenosis C6-C7 and mild canal stenosis at C5-C6. Potentially moderate foraminal stenosis at C6-C7 and mild foraminal stenosis at C5-C6, although confident assessment of the foramina is not possible due to motion. A CT myelogram or repeat MRI with sedation may be helpful to better characterize, if clinically indicated. Electronically Signed   By: Margaretha Sheffield M.D.   On: 08/26/2021 11:27                 ROS: All systems reviewed are negative as they relate to the chief complaint within the history of present illness.  Patient denies fevers or chills.  Assessment & Plan: Visit Diagnoses:  1. Cervicalgia     Plan: [name] is a 60 y.o. female who  presents to the office for  review of MRI scan of the cervical spine.  She has bilateral arm radicular pain with numbness and tingling throughout both hands.  Most of her pain is in the left shoulder but does seem that she has some component of her arm pain from her cervical spine given the findings including moderate foraminal stenosis at C6-C7 with mild to moderate canal stenosis at C6-C7 as well.  MRI was limited by motion artifact.  Plan for referral to Dr. Ernestina Patches for cervical spine ESI to determine what percentage of her pain is coming from her cervical spine versus her left shoulder.  Seems most of her pain is coming from the left shoulder arthritis based on talking to her and examining her today.  Plan to return after CT scan of the left shoulder to review CT scan and discuss how much relief she had from C-spine ESI if she has had it by then.  Follow-Up Instructions: No follow-ups on file.   Orders:  Orders Placed This Encounter  Procedures   Ambulatory referral to Physical Medicine Rehab   No orders of the defined types were placed in this encounter.     Procedures: No procedures performed   Clinical Data: No additional findings.  Objective: Vital Signs: LMP 02/03/2012   Physical Exam:  Constitutional: Patient appears well-developed HEENT:  Head: Normocephalic Eyes:EOM are normal Neck: Normal range of motion Cardiovascular: Normal rate Pulmonary/chest: Effort normal Neurologic: Patient is alert Skin: Skin is warm Psychiatric: Patient has normal mood and affect  Ortho Exam: Ortho exam demonstrates left shoulder with 30 degrees external rotation, 90 degrees abduction, 100 degrees forward flexion.  Axillary nerve intact with deltoid firing.  Excellent subscapularis strength.  Supraspinatus and infraspinatus strength intact.  +5 motor strength of bilateral grip strength, finger abduction, pronation/supination, bicep, tricep, deltoid.  Severe pain with terminal range of motion of  the left shoulder.  Specialty Comments:  No specialty comments available.  Imaging: No results found.   PMFS History: Patient Active Problem List   Diagnosis Date Noted   Encounter for medication refill 08/21/2021   Spondylolisthesis, lumbar region 02/11/2021    Class: Acute   Status post lumbar spinal fusion 02/11/2021   URI with cough and congestion 12/04/2020   Neuropathy of forearm, right 09/25/2020   Shoulder pain, left 09/25/2020   Tick bite 04/06/2020   Stiffness of hand joint 06/09/2019   Right arm numbness 06/09/2019   Low back pain 05/02/2019   Pain of left hip joint 08/09/2018   Malignant neoplasm of upper-outer quadrant of right breast in female, estrogen receptor positive ([place]) 06/10/2018   Screening for colon cancer 03/12/2018   Screening for breast cancer 03/12/2018   Restless leg syndrome 02/04/2018   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   Depression 08/06/2015   Hypertension associated with diabetes ([place])    Aortic stenosis    Chronic diastolic congestive heart failure (HCC)    COPD (chronic obstructive pulmonary disease) ([place]) 09/21/2013   Obstructive sleep apnea 09/21/2013   Nocturnal hypoxemia 09/12/2013   Abnormal CT scan, chest 05/12/2013   Hyperlipidemia 11/30/2012   History of pulmonary embolus (PE) 07/09/2012   Normocytic anemia 02/08/2012   Type 2 diabetes mellitus without complication, without long-term current use of insulin ([place]) 02/08/2012   Asthma 02/08/2012   GERD (gastroesophageal reflux disease)    CAD (coronary artery disease) 12/08/2011   Past Medical History:  Diagnosis Date   Anemia    Aortic valve stenosis, severe    Arthritis    PAIN  AND SEVERE OA LEFT KNEE ; S/P RIGHT TKA ON 02/03/12; HAS LOWER BACK PAIN-UNABLE TO STAND MORE THAN 10 MIN; ARTHRITIS "ALL OVER"   Asthma    Blood transfusion    [facility]   Breast cancer in female [facility])    Right   CAD (coronary artery disease)    Cath 2010 with DES x 1 RCA--  PT'S CARDIOLOGIST IS DR. MCALHANY   Chronic diastolic congestive heart failure (HCC)    COPD (chronic obstructive pulmonary disease) ([place])    Depression    Diabetes mellitus DIAGNOSED IN2010   Controll s with diet   Dyspnea    with much ambulation   Eczema    on back   Headache    migraines younger- rare now 02/07/21   Heart murmur    History of hiatal hernia    History of kidney stones    passed or blasted   Hyperlipidemia    Hypertension    Morbid obesity with body mass index of 60.0-69.9 in adult [facility])    Myocardial infarction ([place])    PT THINKS SHE WAS DX WITH MI AT THE TIME OF HEART STENTING   Neuromuscular disorder ([place])    bilateral hands   Personal history of radiation therapy    Pneumonia    Pulmonary embolism ([place]) 02/08/2012   S/P RT TOTAL KNEE ON 02/03/12--ON 02/08/12--DEVELOPED ACUTE SOB AND CHEST PAIN--AND DIAGNOSED WITH  PULMONARY EMBOLUS AND PNEUMONIA   Restless leg syndrome    Sleep apnea    uses 3 liters O2 at night    Uterine fibroid    NO PROBLEMS AT PRESENT FROM THE FIBROIDS-STATES SHE IS POST MENOPAUSAL-LAST MENSES 2010 EXCEPT FOR EPISODE THIS YR OF BLEEDING RELATED TO FIBROIDS.   Weakness    BOTH HANDS - S/P BILATERAL CARPAL TUNNEL RELEASE--BUT STILL HAS WEAKNESS--OFTEN DROPS THINGS    Family History  Problem Relation Age of Onset   Breast cancer Mother        stage IV at diagnosis   Emphysema Mother        smoked   Heart disease Mother    COPD Father        smoked   Asthma Father    Heart disease Father    Cancer Brother        Sinus    Past Surgical History:  Procedure Laterality Date   BREAST BIOPSY Right 06/04/2018   BREAST LUMPECTOMY Right 06/2018   BREAST LUMPECTOMY WITH RADIOACTIVE SEED AND SENTINEL LYMPH NODE BIOPSY Right 07/19/2018   Procedure: BREAST LUMPECTOMY WITH RADIOACTIVE SEED AND SENTINEL LYMPH NODE BIOPSY;  Surgeon: Alphonsa Overall, MD;  Location: [facility];  Service: General;  Laterality: Right;   CARDIAC CATHETERIZATION      CARDIAC VALVE REPLACEMENT     2017   CARPAL TUNNEL RELEASE     Bilateral   CHOLECYSTECTOMY     CORONARY ANGIOPLASTY     2010 has stent in place   CYSTOSCOPY W/ RETROGRADES Right 09/21/2013   Procedure: CYSTOSCOPY WITH RIGHT RETROGRADE PYELOGRAM RIGHT DOUBLE J STENT ;  Surgeon: Fredricka Bonine, MD;  Location: [facility];  Service: Urology;  Laterality: Right;   CYSTOSCOPY WITH URETEROSCOPY AND STENT PLACEMENT Right 10/25/2013   Procedure: CYSTOSCOPY RIGHT URETEROSCOPY HOLMIUM LASER LITHO AND STENT PLACEMENT;  Surgeon: Fredricka Bonine, MD;  Location: [facility];  Service: Urology;  Laterality: Right;   HERNIA REPAIR     INTRAOPERATIVE TRANSESOPHAGEAL ECHOCARDIOGRAM N/A 12/12/2014   Procedure: INTRAOPERATIVE TRANSESOPHAGEAL ECHOCARDIOGRAM;  Surgeon: Burnell Blanks, MD;  Location: [facility];  Service: Cardiovascular;  Laterality: N/A;   JOINT REPLACEMENT     bil total knees   KNEE ARTHROPLASTY  02/03/2012   Procedure: COMPUTER ASSISTED TOTAL KNEE ARTHROPLASTY;  Surgeon: Mcarthur Rossetti, MD;  Location: [facility];  Service: Orthopedics;  Laterality: Right;  Right total knee arthroplasty   LEFT AND RIGHT HEART CATHETERIZATION WITH CORONARY ANGIOGRAM N/A 03/17/2013   Procedure: LEFT AND RIGHT HEART CATHETERIZATION WITH CORONARY ANGIOGRAM;  Surgeon: Burnell Blanks, MD;  Location: [facility] CATH LAB;  Service: Cardiovascular;  Laterality: N/A;   LEFT AND RIGHT HEART CATHETERIZATION WITH CORONARY/GRAFT ANGIOGRAM N/A 09/14/2014   Procedure: LEFT AND RIGHT HEART CATHETERIZATION WITH Beatrix Fetters;  Surgeon: Burnell Blanks, MD;  Location: [facility] CATH LAB;  Service: Cardiovascular;  Laterality: N/A;   TEE WITHOUT CARDIOVERSION N/A 03/14/2013   Procedure: TRANSESOPHAGEAL ECHOCARDIOGRAM (TEE);  Surgeon: Lelon Perla, MD;  Location: [facility] ENDOSCOPY;  Service: Cardiovascular;  Laterality: N/A;   TEE WITHOUT CARDIOVERSION N/A 11/14/2014   Procedure: TRANSESOPHAGEAL ECHOCARDIOGRAM  (TEE);  Surgeon: Lelon Perla, MD;  Location: [facility] ENDOSCOPY;  Service: Cardiovascular;  Laterality: N/A;   TONSILLECTOMY     maybe as a child- does not know   TOTAL KNEE ARTHROPLASTY  09/10/2012   Procedure: TOTAL KNEE ARTHROPLASTY;  Surgeon: Mcarthur Rossetti, MD;  Location: [facility];  Service: Orthopedics;  Laterality: Left;   TOTAL KNEE REVISION Right 07/15/2013   Procedure: REVISION ARTHROPLASTY RIGHT KNEE;  Surgeon: Mcarthur Rossetti, MD;  Location: [facility];  Service: Orthopedics;  Laterality: Right;   TRANSCATHETER AORTIC VALVE REPLACEMENT, TRANSFEMORAL N/A 12/12/2014   Procedure: TRANSCATHETER AORTIC VALVE REPLACEMENT, TRANSFEMORAL;  Surgeon: Burnell Blanks, MD;  Location: [facility];  Service: Cardiovascular;  Laterality: N/A;   TRIGGER FINGER RELEASE  09/10/2012   Procedure: RELEASE TRIGGER FINGER/A-1 PULLEY;  Surgeon: Mcarthur Rossetti, MD;  Location[facility];  Service: Orthopedics;  Laterality: Right;  Right Ring Finger   TUBAL LIGATION     Social History   Occupational History   Occupation: Disabled  Tobacco Use   Smoking status: Former    Packs/day: 1.50    Years: 30.00    Pack years: 45.00    Types: Cigarettes    Quit date: 12/29/2000    Years since quitting: 20.7   Smokeless tobacco: Never  Vaping Use   Vaping Use: Never used  Substance and Sexual Activity   Alcohol use: Not Currently   Drug use: Not Currently   Sexual activity: Not Currently    Birth control/protection: Surgical

## 2021-09-14 ENCOUNTER — Other Ambulatory Visit: Payer: Self-pay | Admitting: Internal Medicine

## 2021-09-17 ENCOUNTER — Other Ambulatory Visit: Payer: Medicaid Other

## 2021-09-28 DIAGNOSIS — Z419 Encounter for procedure for purposes other than remedying health state, unspecified: Secondary | ICD-10-CM | POA: Diagnosis not present

## 2021-10-09 ENCOUNTER — Ambulatory Visit
Admission: RE | Admit: 2021-10-09 | Discharge: 2021-10-09 | Disposition: A | Discharge status: Home / Self Care | Payer: Medicaid Other | Source: Ambulatory Visit | Attending: Orthopedic Surgery | Admitting: Orthopedic Surgery

## 2021-10-09 ENCOUNTER — Other Ambulatory Visit: Payer: Self-pay

## 2021-10-09 DIAGNOSIS — M25512 Pain in left shoulder: Secondary | ICD-10-CM

## 2021-10-09 IMAGING — CT CT SHOULDER*L* W/O CM
1 of 3 series · 7 of 14 positions shown, 9 images · non-contrast
Comparison: MRI [DATE].  Radiographs [DATE].

CLINICAL DATA: Left shoulder pain with decreased range of motion,
numbness and weakness. Preoperative assessment for reverse
arthroplasty.

EXAM:
CT OF THE UPPER LEFT EXTREMITY WITHOUT CONTRAST
TECHNIQUE: Multidetector CT imaging of the left shoulder was performed
according to the standard protocol.

[Series 5: thin soft · axial · 0.58mm/px · z∈[-187,-25]mm · 7 of 361 slices shown, 9 images]
[im 46/361  soft-tissue]
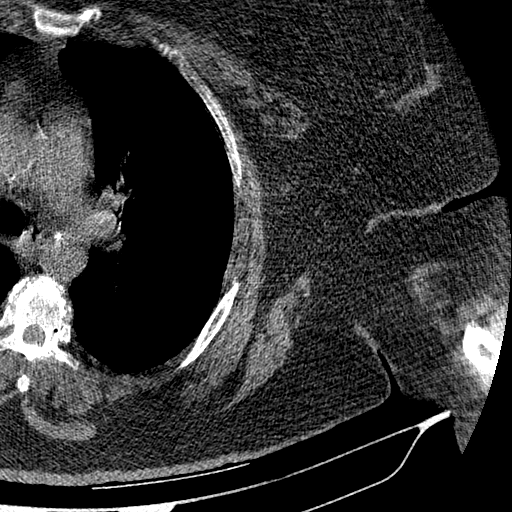
[im 46/361  bone]
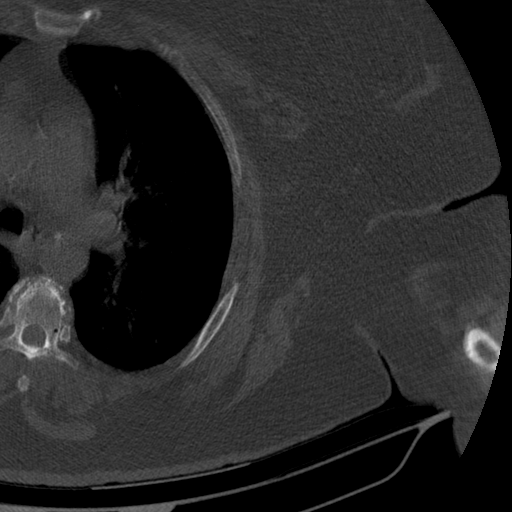
[im 91/361  bone]
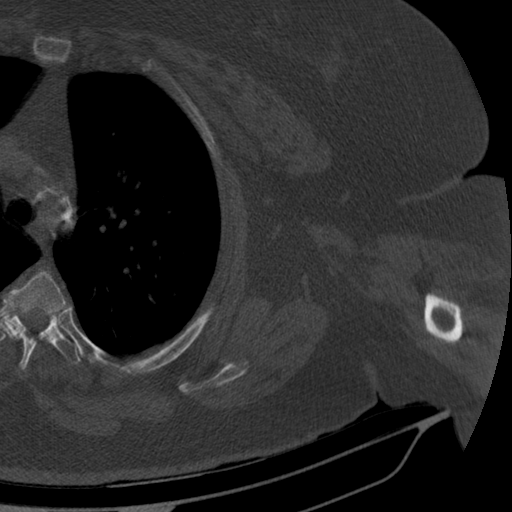
[im 136/361  bone]
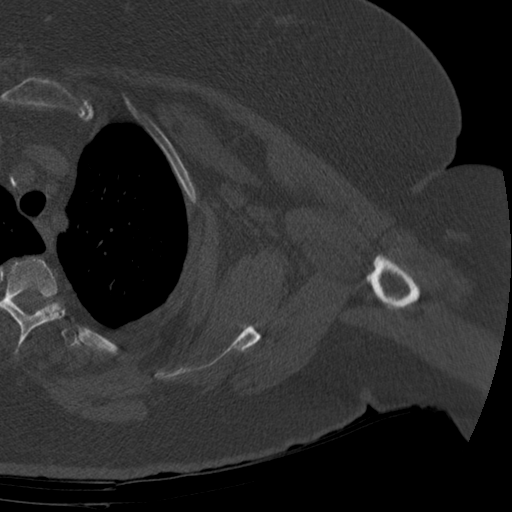
[im 181/361  bone]
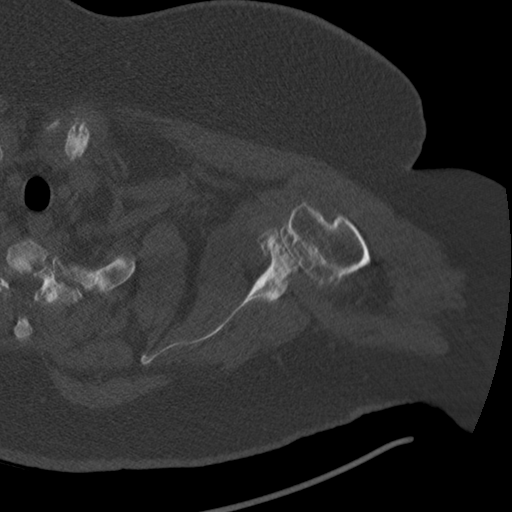
[im 226/361  soft-tissue]
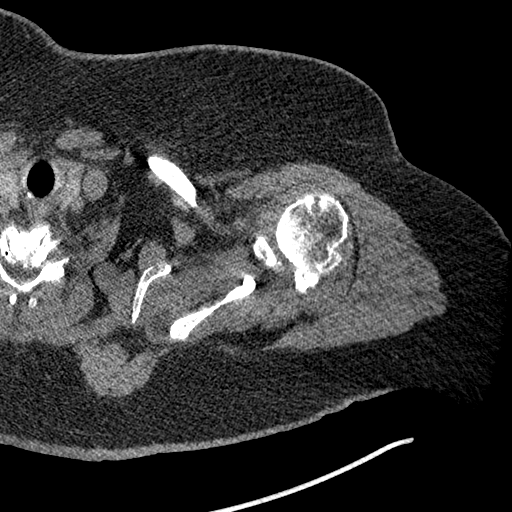
[im 226/361  bone]
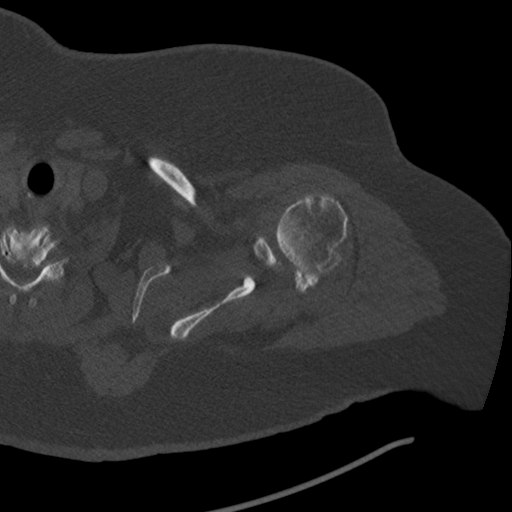
[im 271/361  bone]
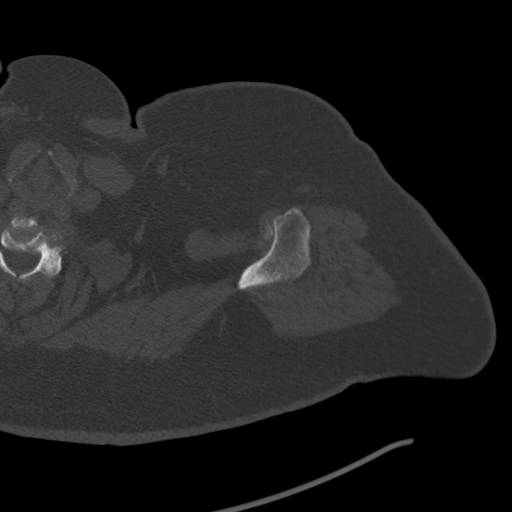
[im 316/361  bone]
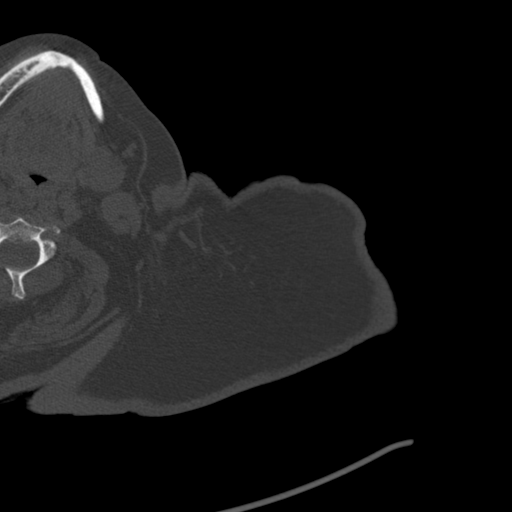

[7 of 14 positions shown; findings below may reference images not displayed]

FINDINGS: Bones/Joint/Cartilage

Advanced glenohumeral osteoarthritis with diffuse chondral thinning,
subchondral cysts and osteophytes in the humeral head and glenoid.
There are small loose bodies posteriorly and a moderate shoulder
joint effusion. Mild acromioclavicular degenerative changes. No
acute osseous findings are evident. There are degenerative changes
within the cervical spine.

Ligaments

Suboptimally assessed by CT.

Muscles and Tendons

No focal rotator cuff muscular atrophy or gross impingement. The
biceps tendon is not well visualized although was intact on previous
MRI. There is a large amount of fluid in the biceps tendon sheath.

Soft tissues

No focal periarticular fluid collection, inflammation or foreign
body identified. Mild emphysematous changes are noted in the lungs.
There is atherosclerosis of the aorta, great vessels and coronary
arteries as well as possible calcifications of the aortic valve.
IMPRESSION: 1. Advanced osteoarthritis of the left glenohumeral joint with small
joint effusion and intra-articular loose bodies.
2. No gross rotator cuff muscular atrophy or impingement.
3. Coronary and aortic Atherosclerosis ([3E]-[3E]). Possible
aortic valvular calcifications.

## 2021-10-18 ENCOUNTER — Other Ambulatory Visit: Payer: Self-pay | Admitting: Specialist

## 2021-10-18 NOTE — Telephone Encounter (Signed)
Pt called stating she fell and hurt her back so she would like to have a rx for tramadol called in.

## 2021-10-20 ENCOUNTER — Emergency Department (HOSPITAL_COMMUNITY)
Admission: EM | Admit: 2021-10-20 | Discharge: 2021-10-20 | Disposition: A | Discharge status: Home / Self Care | Payer: Medicaid Other | Attending: Emergency Medicine | Admitting: Emergency Medicine

## 2021-10-20 ENCOUNTER — Emergency Department (HOSPITAL_COMMUNITY): Payer: Medicaid Other

## 2021-10-20 DIAGNOSIS — S0990XA Unspecified injury of head, initial encounter: Secondary | ICD-10-CM

## 2021-10-20 DIAGNOSIS — S0181XA Laceration without foreign body of other part of head, initial encounter: Secondary | ICD-10-CM

## 2021-10-20 DIAGNOSIS — Z23 Encounter for immunization: Secondary | ICD-10-CM | POA: Diagnosis not present

## 2021-10-20 DIAGNOSIS — Y92481 Parking lot as the place of occurrence of the external cause: Secondary | ICD-10-CM | POA: Insufficient documentation

## 2021-10-20 DIAGNOSIS — G8929 Other chronic pain: Secondary | ICD-10-CM | POA: Diagnosis not present

## 2021-10-20 IMAGING — CT CT HEAD W/O CM
4 series · 16 of 47 positions shown, 18 images · non-contrast
Comparison: None.

CLINICAL DATA: Head trauma, minor (Age >= 65y).

EXAM:
CT HEAD WITHOUT CONTRAST
TECHNIQUE: Contiguous axial images were obtained from the base of the skull
through the vertex without intravenous contrast.

[Series 3: head without · axial · non-contrast · 0.42mm/px · z∈[-72,+48]mm · 7 of 32 slices shown, 9 images]
[im 4/32  brain]
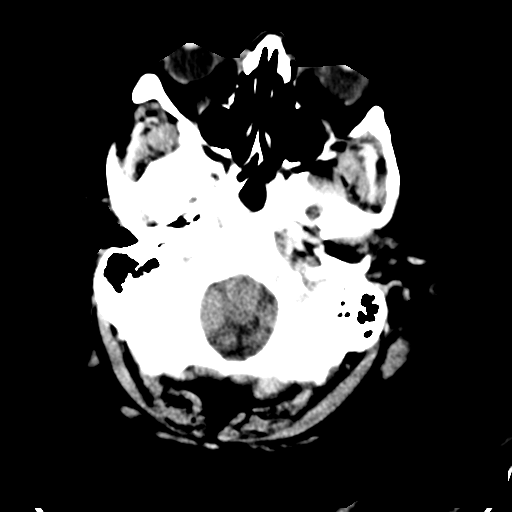
[im 4/32  bone]
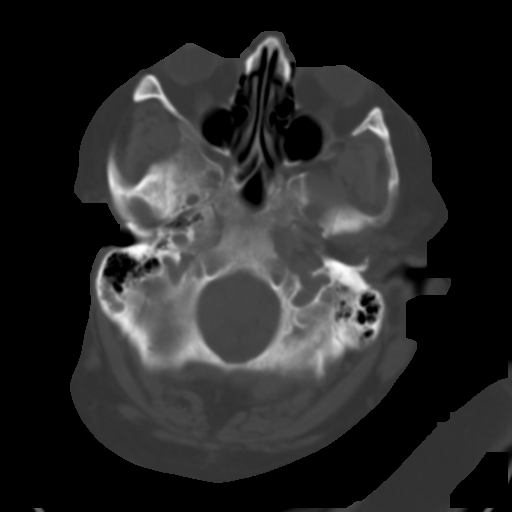
[im 8/32  brain]
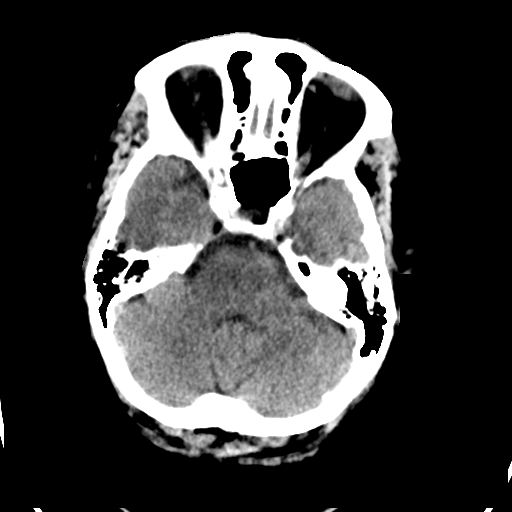
[im 12/32  brain]
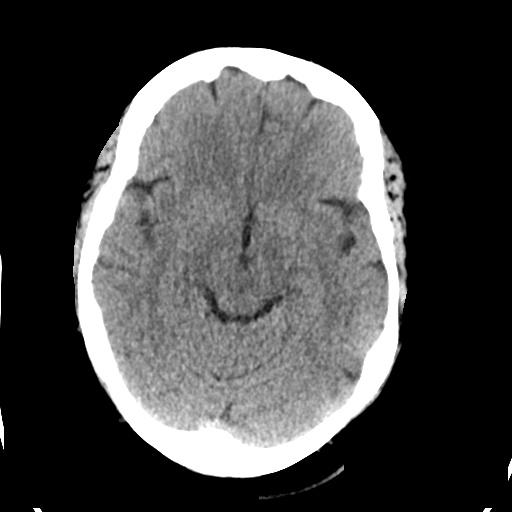
[im 16/32  brain]
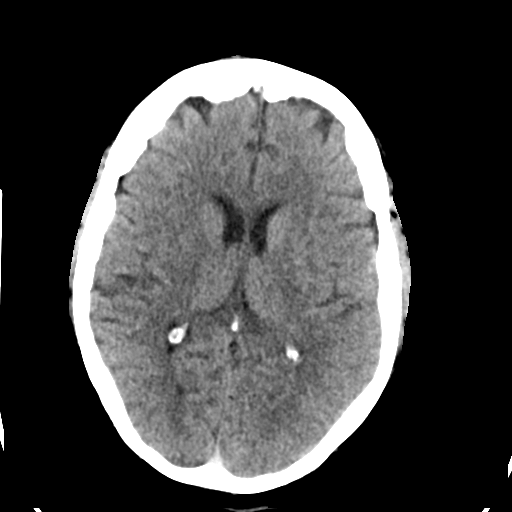
[im 20/32  brain]
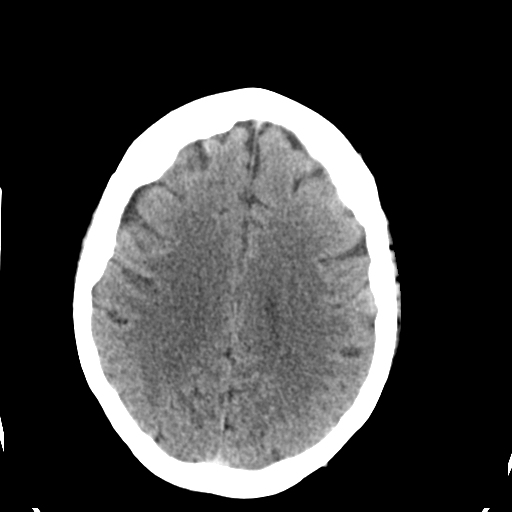
[im 20/32  bone]
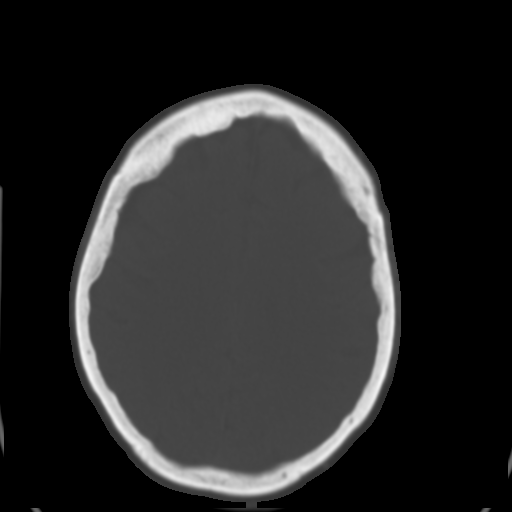
[im 24/32  brain]
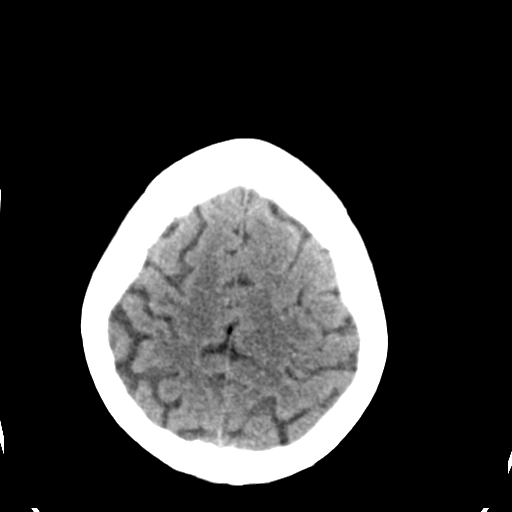
[im 28/32  brain]
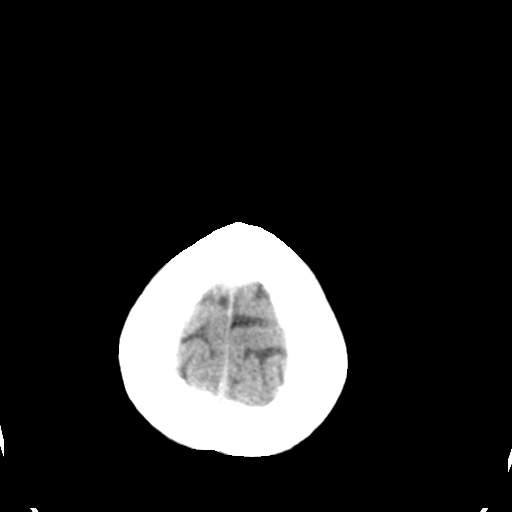

[Series 4: head bone · axial · 0.42mm/px · z∈[-73,-41]mm · 3 of 80 slices shown]
[im 8/80  bone]
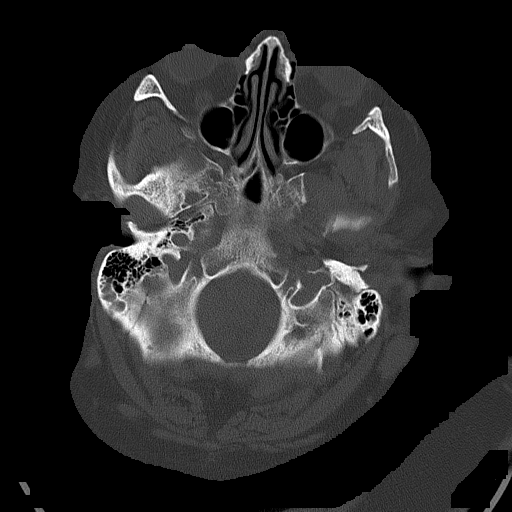
[im 16/80  bone]
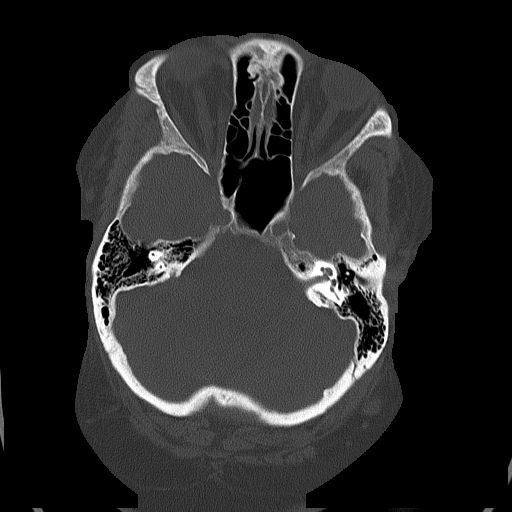
[im 24/80  bone]
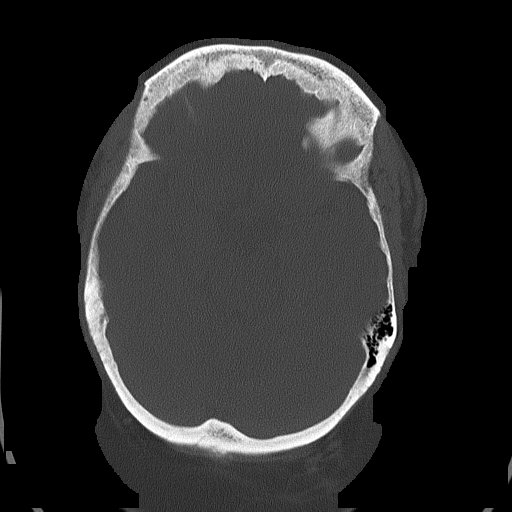

[Series 5: head without cor · coronal · non-contrast · 0.32mm/px · 3 of 69 slices shown]
[im 23/69  brain]
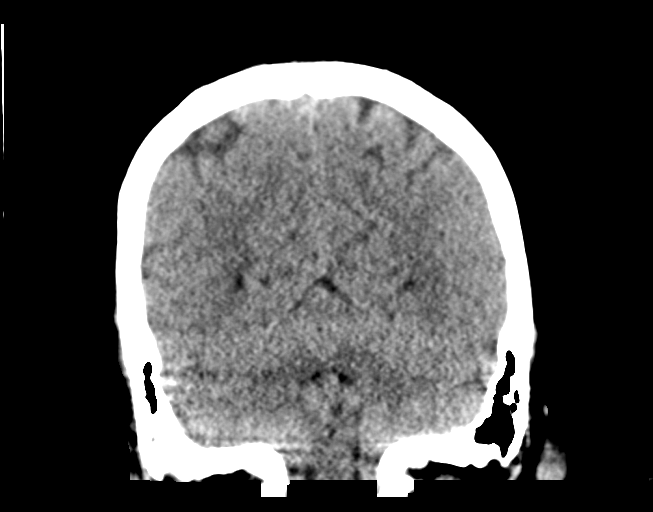
[im 31/69  brain]
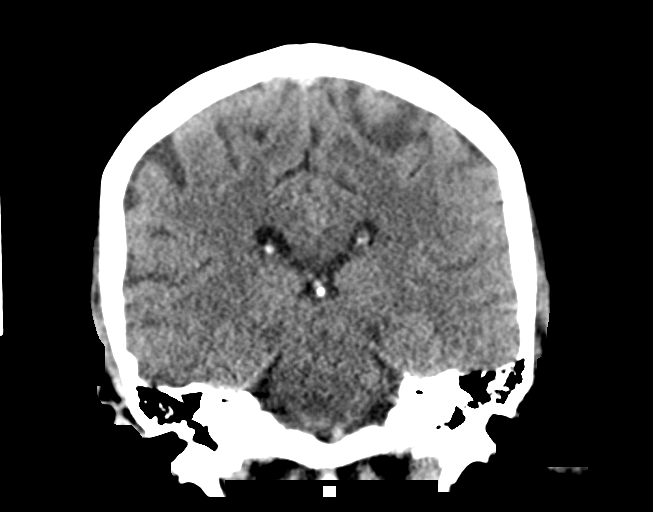
[im 38/69  brain]
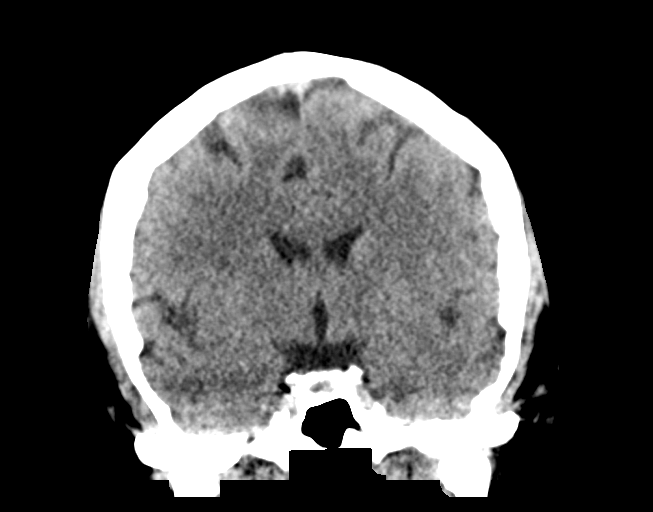

[Series 6: head without sag · sagittal · non-contrast · 0.30mm/px · 3 of 67 slices shown]
[im 23/67  brain]
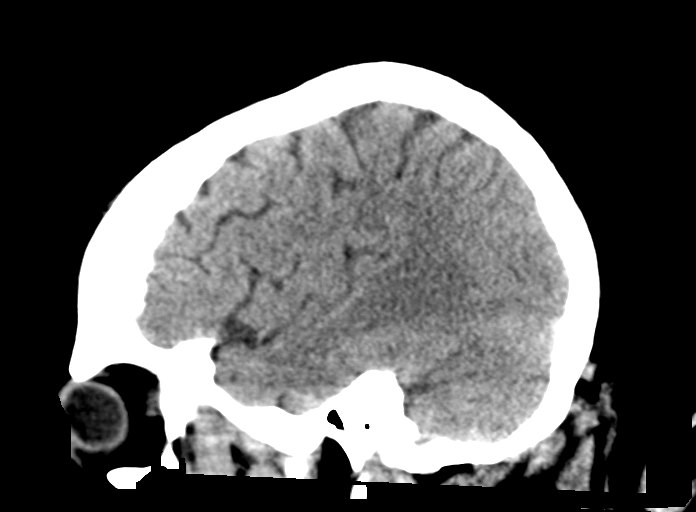
[im 34/67  brain]
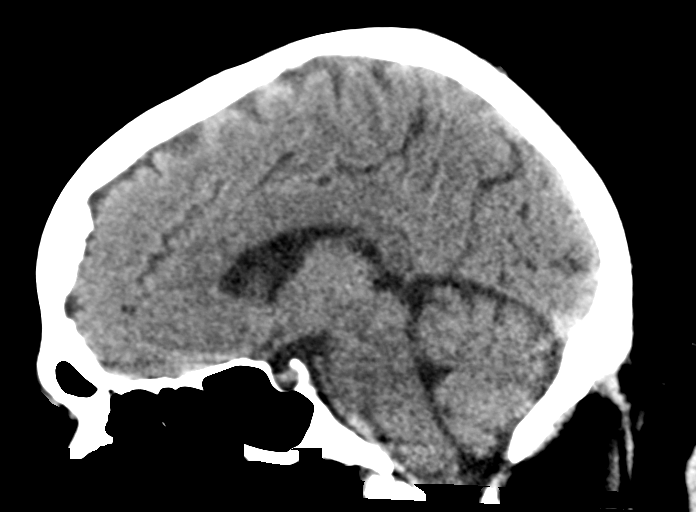
[im 45/67  brain]
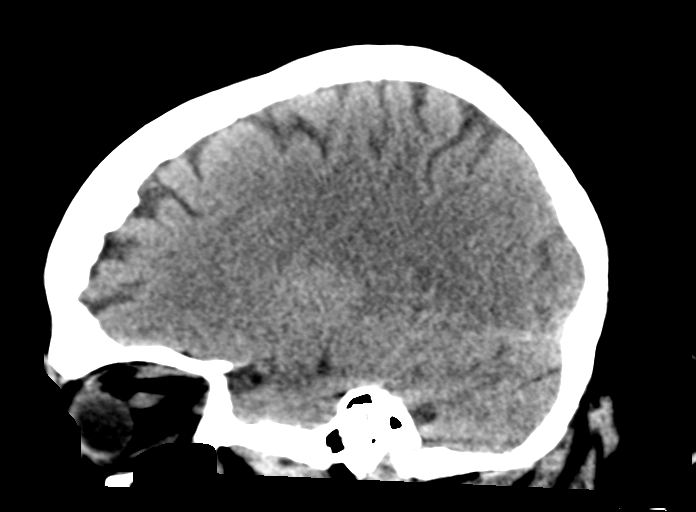

[16 of 47 positions shown; findings below may reference images not displayed]

FINDINGS: Brain: No acute intracranial abnormality. Specifically, no
hemorrhage, hydrocephalus, mass lesion, acute infarction, or
significant intracranial injury.

Vascular: No hyperdense vessel or unexpected calcification.

Skull: No acute calvarial abnormality.

Sinuses/Orbits: No acute findings

Other: None
IMPRESSION: No acute intracranial abnormality.

## 2021-10-20 MED ORDER — TETANUS-DIPHTH-ACELL PERTUSSIS 5-2.5-18.5 LF-MCG/0.5 IM SUSY
0.5000 mL | PREFILLED_SYRINGE | Freq: Once | INTRAMUSCULAR | Status: AC
  Administered 2021-10-20: 0.5 mL via INTRAMUSCULAR
  Filled 2021-10-20: qty 0.5

## 2021-10-20 NOTE — ED Notes (Signed)
Patient transported to CT w/ TRN ?
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
## 2021-10-20 NOTE — ED Notes (Signed)
Pt returned from CT, NAD noted
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
## 2021-10-20 NOTE — ED Triage Notes (Signed)
Approx 1in lac to R forehead; bleeding controlled on arrival to ED

## 2021-10-21 ENCOUNTER — Telehealth: Payer: Self-pay

## 2021-10-21 ENCOUNTER — Encounter: Payer: Medicaid Other | Admitting: Physical Therapy

## 2021-10-21 MED ORDER — TRAMADOL HCL 50 MG PO TABS: 50.0000 mg | ORAL_TABLET | Freq: Three times a day (TID) | ORAL | 0 refills | Status: DC | PRN

## 2021-10-21 NOTE — Telephone Encounter (Addendum)
Transition Care Management Unsuccessful Follow-up Telephone Call  Date of discharge and from where:  10/20/2021-[facility]   Attempts:  1st Attempt  Reason for unsuccessful TCM follow-up call:  Left voice message

## 2021-10-22 ENCOUNTER — Ambulatory Visit (INDEPENDENT_AMBULATORY_CARE_PROVIDER_SITE_OTHER): Payer: Medicaid Other | Admitting: Physical Medicine and Rehabilitation

## 2021-10-22 ENCOUNTER — Encounter: Payer: Self-pay | Admitting: Physical Medicine and Rehabilitation

## 2021-10-22 ENCOUNTER — Other Ambulatory Visit: Payer: Self-pay

## 2021-10-22 ENCOUNTER — Telehealth: Payer: Self-pay | Admitting: Orthopedic Surgery

## 2021-10-22 VITALS — BP 138/78 | HR 84

## 2021-10-22 DIAGNOSIS — M4802 Spinal stenosis, cervical region: Secondary | ICD-10-CM | POA: Diagnosis not present

## 2021-10-22 DIAGNOSIS — M542 Cervicalgia: Secondary | ICD-10-CM | POA: Diagnosis not present

## 2021-10-22 DIAGNOSIS — M5412 Radiculopathy, cervical region: Secondary | ICD-10-CM | POA: Diagnosis not present

## 2021-10-22 DIAGNOSIS — M7918 Myalgia, other site: Secondary | ICD-10-CM

## 2021-10-22 NOTE — Progress Notes (Signed)
Bilateral neck pain and shoulder pain. Numbness and tingling in both arms down to fingers. Numeric Pain Rating Scale and Functional Assessment Average Pain 6 Pain Right Now 7 My pain is constant, dull, and aching Pain is worse with:  lying on left side, lifting left arm Pain improves with: heat/ice   In the last MONTH (on 0-10 scale) has pain interfered with the following?  1. General activity like being  able to carry out your everyday physical activities such as walking, climbing stairs, carrying groceries, or moving a chair?  Rating(5)  2. Relation with others like being able to carry out your usual social activities and roles such as  activities at home, at work and in your community. Rating(5)  3. Enjoyment of life such that you have  been bothered by emotional problems such as feeling anxious, depressed or irritable?  Rating(0)

## 2021-10-22 NOTE — Telephone Encounter (Signed)
Patient will call when ready for left shoulder replacement.   She has decide to hold off until after the injections with Dr. Ernestina Patches. Patient provided with name and direct number for scheduling surgery.

## 2021-10-22 NOTE — Telephone Encounter (Signed)
Transition Care Management Unsuccessful Follow-up Telephone Call  Date of discharge and from where:  10/20/2021-[place]  Attempts:  2nd Attempt  Reason for unsuccessful TCM follow-up call:  Left voice message
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
## 2021-10-23 NOTE — Telephone Encounter (Signed)
Transition Care Management Unsuccessful Follow-up Telephone Call  Date of discharge and from where:  10/20/2021-[place]  Attempts:  3rd Attempt  Reason for unsuccessful TCM follow-up call:  Left voice message

## 2021-10-27 ENCOUNTER — Other Ambulatory Visit: Payer: Self-pay | Admitting: Oncology

## 2021-10-27 ENCOUNTER — Other Ambulatory Visit: Payer: Self-pay | Admitting: Student

## 2021-10-27 DIAGNOSIS — I2510 Atherosclerotic heart disease of native coronary artery without angina pectoris: Secondary | ICD-10-CM

## 2021-10-27 DIAGNOSIS — I350 Nonrheumatic aortic (valve) stenosis: Secondary | ICD-10-CM

## 2021-10-29 DIAGNOSIS — Z419 Encounter for procedure for purposes other than remedying health state, unspecified: Secondary | ICD-10-CM | POA: Diagnosis not present

## 2021-10-29 NOTE — Telephone Encounter (Signed)
Next Appt With Internal Medicine [name], MD)11/14/2021 at  2:45 PM  Last OV 08/21/2021

## 2021-11-04 ENCOUNTER — Ambulatory Visit (INDEPENDENT_AMBULATORY_CARE_PROVIDER_SITE_OTHER): Payer: Medicaid Other

## 2021-11-04 ENCOUNTER — Ambulatory Visit (INDEPENDENT_AMBULATORY_CARE_PROVIDER_SITE_OTHER): Payer: Medicaid Other | Admitting: Specialist

## 2021-11-04 ENCOUNTER — Telehealth: Payer: Self-pay

## 2021-11-04 ENCOUNTER — Encounter: Payer: Self-pay | Admitting: Specialist

## 2021-11-04 ENCOUNTER — Other Ambulatory Visit: Payer: Self-pay

## 2021-11-04 VITALS — BP 131/77 | HR 92 | Ht 63.0 in | Wt 225.0 lb

## 2021-11-04 DIAGNOSIS — M4802 Spinal stenosis, cervical region: Secondary | ICD-10-CM | POA: Diagnosis not present

## 2021-11-04 DIAGNOSIS — M4722 Other spondylosis with radiculopathy, cervical region: Secondary | ICD-10-CM

## 2021-11-04 DIAGNOSIS — Z981 Arthrodesis status: Secondary | ICD-10-CM

## 2021-11-04 NOTE — Progress Notes (Signed)
Office Visit Note   Patient: [name]           Date of Birth: [birth year]           MRN: [id number] Visit Date: 11/04/2021              Requested by: [name], MD [address] [place],  [place] [address] PCP: [name], MD   Assessment & Plan: Visit Diagnoses:  1. S/P lumbar spinal fusion     Plan: Avoid overhead lifting and overhead use of the arms. Do not lift greater than 5 lbs. Adjust head rest in vehicle to prevent hyperextension if rear ended. Take extra precautions to avoid falling. If ESI is of benefit then you may wish to wait on intervention but based on your exam  You have left C7 radiculopathy and if unable to have injection then surgical intervention Would be recommended with C6 corpectomy and fusion C5-C7 with biforamenal decompression at both levels.   Follow-Up Instructions: No follow-ups on file.   Orders:  Orders Placed This Encounter  Procedures  . XR Lumbar Spine 2-3 Views   No orders of the defined types were placed in this encounter.     Procedures: No procedures performed   Clinical Data: Findings:  Narrative & Impression CLINICAL DATA:  Cervicalgia M54.2 (ICD-10-CM)  Evaluate for source of radicular left arm pain with numbness.  EXAM: MRI CERVICAL SPINE WITHOUT CONTRAST  TECHNIQUE: Multiplanar, multisequence MR imaging of the cervical spine was performed. No intravenous contrast was administered.  COMPARISON:  Cervical radiographs April 25, 2021.  FINDINGS: Moderate to severe motion limited exam despite repeated series. Within this limitation:  Alignment: Straightening without substantial sagittal subluxation  Vertebrae: No focal marrow edema to suggest acute fracture or discitis/osteomyelitis. Vertebral body heights are maintained. No obvious suspicious bone lesions.  Cord: Motion limited evaluation without evidence of abnormal cord signal.  Posterior Fossa, vertebral arteries, paraspinal  tissues: The visualized vertebral artery flow voids are grossly maintained. Grossly unremarkable visualized posterior fossa.  Disc levels:  C2-C3: Mild left facet hypertrophy without significant canal or foraminal stenosis.  C3-C4: Posterior disc osteophyte complex and bilateral facet/uncovertebral hypertrophy. Limited evaluation of foramina due to motion with suspected mild bilateral foraminal stenosis. No significant canal stenosis.  C4-C5: Small posterior disc osteophyte complex. Bilateral facet/uncovertebral hypertrophy. Motion limited evaluation of the foramina with suspected mild bilateral foraminal stenosis. No significant canal stenosis.  C5-C6: Posterior disc osteophyte complex with left greater than right facet and uncovertebral hypertrophy. Significantly motion limited evaluation of foramina with potentially mild left greater than right foraminal stenosis. Likely mild canal stenosis.  C6-C7: Posterior disc osteophyte complex and ligamentum flavum thickening with likely mild to moderate canal stenosis. Bilateral facet uncovertebral hypertrophy. Significantly motion limited evaluation of foramina with potentially moderate bilateral foraminal stenosis.  C7-T1: Significantly motion limited evaluation without evidence of significant canal stenosis.  IMPRESSION: Moderate to severe motion degradation. Likely mild-to-moderate canal stenosis C6-C7 and mild canal stenosis at C5-C6. Potentially moderate foraminal stenosis at C6-C7 and mild foraminal stenosis at C5-C6, although confident assessment of the foramina is not possible due to motion. A CT myelogram or repeat MRI with sedation may be helpful to better characterize, if clinically indicated.   Electronically Signed   By: Margaretha Sheffield M.D.   On: 08/26/2021 11:27     Subjective: Chief Complaint  Patient presents with  . Lower Back - Follow-up    9 months post L4-5 TLIF    60 year  old female  with history of neck and arm pain for years. Previous Lumbar TLIF L4-5 9 months ago. She has  Morbid obesity and has done well with the lumbar surgery. Complains of ongoing shoulder discomfort left side and  Neck and arm pain bilaterally. The left arm feels like it is pulling down and wants to go out of the socket. No bowel or  Bladder changes. Feels better when she lifts her right opposite arm and hold it onto her head.    Review of Systems  Constitutional: Negative.   HENT: Negative.    Eyes: Negative.   Respiratory: Negative.    Cardiovascular: Negative.   Gastrointestinal: Negative.   Endocrine: Negative.   Musculoskeletal: Negative.   Skin: Negative.   Allergic/Immunologic: Negative.   Neurological: Negative.   Hematological: Negative.   Psychiatric/Behavioral: Negative.  Negative for decreased concentration.     Objective: Vital Signs: BP 131/77 (BP Location: Left Arm, Patient Position: Sitting)   Pulse 92   Ht 5\' 3"  (1.6 m)   Wt 225 lb (102.1 kg)   LMP 02/03/2012   BMI 39.86 kg/m   Physical Exam Constitutional:      Appearance: She is well-developed.  HENT:     Head: Normocephalic and atraumatic.  Eyes:     Pupils: Pupils are equal, round, and reactive to light.  Pulmonary:     Effort: Pulmonary effort is normal.     Breath sounds: Normal breath sounds.  Abdominal:     General: Bowel sounds are normal.     Palpations: Abdomen is soft.  Musculoskeletal:     Cervical back: Normal range of motion and neck supple.     Lumbar back: Negative right straight leg raise test and negative left straight leg raise test.  Skin:    General: Skin is warm and dry.  Neurological:     Mental Status: She is alert and oriented to person, place, and time.  Psychiatric:        Behavior: Behavior normal.        Thought Content: Thought content normal.        Judgment: Judgment normal.   Back Exam   Tenderness  The patient is experiencing tenderness in the cervical.  Range  of Motion  Extension:  abnormal  Flexion:  abnormal  Lateral bend right:  abnormal  Lateral bend left:  abnormal  Rotation right:  abnormal  Rotation left:  abnormal   Muscle Strength  Right Quadriceps:  5/5  Left Quadriceps:  5/5  Right Hamstrings:  5/5  Left Hamstrings:  5/5   Tests  Straight leg raise right: negative Straight leg raise left: negative  Reflexes  Patellar:  0/4 Achilles:  0/4  Comments:  Weak left triceps, left wrist flexors and finger extenison C7 pattern    Specialty Comments:  No specialty comments available.  Imaging: No results found.   PMFS History: Patient Active Problem List   Diagnosis Date Noted  . Spondylolisthesis, lumbar region 02/11/2021    Priority: High    Class: Acute  . Encounter for medication refill 08/21/2021  . Status post lumbar spinal fusion 02/11/2021  . URI with cough and congestion 12/04/2020  . Neuropathy of forearm, right 09/25/2020  . Shoulder pain, left 09/25/2020  . Tick bite 04/06/2020  . Stiffness of hand joint 06/09/2019  . Right arm numbness 06/09/2019  . Low back pain 05/02/2019  . Pain of left hip joint 08/09/2018  . Malignant neoplasm of upper-outer quadrant of right breast  in female, estrogen receptor positive ([place]) 06/10/2018  . Screening for colon cancer 03/12/2018  . Screening for breast cancer 03/12/2018  . Restless leg syndrome 02/04/2018  . 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 Depression 08/06/2015  . Hypertension associated with diabetes ([place])   . Aortic stenosis   . Chronic diastolic congestive heart failure ([place])   . COPD (chronic obstructive pulmonary disease) ([place]) 09/21/2013  . Obstructive sleep apnea 09/21/2013  . Nocturnal hypoxemia 09/12/2013  . Abnormal CT scan, chest 05/12/2013  . Hyperlipidemia 11/30/2012  . History of pulmonary embolus (PE) 07/09/2012  . Normocytic anemia 02/08/2012  . Type 2 diabetes mellitus without complication, without long-term current  use of insulin ([place]) 02/08/2012  . Asthma 02/08/2012  . GERD (gastroesophageal reflux disease)   . CAD (coronary artery disease) 12/08/2011   Past Medical History:  Diagnosis Date  . Anemia   . Aortic valve stenosis, severe   . 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 Asthma   . Blood transfusion    2013[facility]  . Breast cancer in female [facility])    Right  . CAD (coronary artery disease)    Cath 2010 with DES x 1 RCA-- PT'S CARDIOLOGIST IS DR. Ackerman  . Chronic diastolic congestive heart failure ([place])   . COPD (chronic obstructive pulmonary disease) ([place])   . Depression   . Diabetes mellitus DIAGNOSED IN2010   Controll s with diet  . Dyspnea    with much ambulation  . Eczema    on back  . Headache    migraines younger- rare now 02/07/21  . Heart murmur   . History of hiatal hernia   . History of kidney stones    passed or blasted  . Hyperlipidemia   . Hypertension   . Morbid obesity with body mass index of 60.0-69.9 in adult [facility])   . Myocardial infarction ([place])    PT THINKS SHE WAS DX WITH MI AT THE TIME OF HEART STENTING  . Neuromuscular disorder ([place])    bilateral hands  . Personal history of radiation therapy   . Pneumonia   . Pulmonary embolism ([place]) 02/08/2012   S/P RT TOTAL KNEE ON 02/03/12--ON 02/08/12--DEVELOPED ACUTE SOB AND CHEST PAIN--AND DIAGNOSED WITH  PULMONARY EMBOLUS AND PNEUMONIA  . Restless leg syndrome   . Sleep apnea    uses 3 liters O2 at night   . Uterine fibroid    NO PROBLEMS AT PRESENT FROM THE FIBROIDS-STATES SHE IS POST MENOPAUSAL-LAST MENSES 2010 EXCEPT FOR EPISODE THIS YR OF BLEEDING RELATED TO FIBROIDS.  Marland Kitchen Weakness    BOTH HANDS - S/P BILATERAL CARPAL TUNNEL RELEASE--BUT STILL HAS WEAKNESS--OFTEN DROPS THINGS    Family History  Problem Relation Age of Onset  . Breast cancer Mother        stage IV at diagnosis  . Emphysema Mother         smoked  . Heart disease Mother   . COPD Father        smoked  . Asthma Father   . Heart disease Father   . Cancer Brother        Sinus    Past Surgical History:  Procedure Laterality Date  . BREAST BIOPSY Right 06/04/2018  . BREAST LUMPECTOMY Right 06/2018  . BREAST LUMPECTOMY WITH RADIOACTIVE SEED AND SENTINEL LYMPH NODE BIOPSY Right 07/19/2018  Procedure: BREAST LUMPECTOMY WITH RADIOACTIVE SEED AND SENTINEL LYMPH NODE BIOPSY;  Surgeon: Alphonsa Overall, MD;  Location: [place];  Service: General;  Laterality: Right;  . CARDIAC CATHETERIZATION    . CARDIAC VALVE REPLACEMENT     2017  . CARPAL TUNNEL RELEASE     Bilateral  . CHOLECYSTECTOMY    . CORONARY ANGIOPLASTY     2010 has stent in place  . CYSTOSCOPY W/ RETROGRADES Right 09/21/2013   Procedure: CYSTOSCOPY WITH RIGHT RETROGRADE PYELOGRAM RIGHT DOUBLE J STENT ;  Surgeon: Fredricka Bonine, MD;  Location[facility];  Service: Urology;  Laterality: Right;  . CYSTOSCOPY WITH URETEROSCOPY AND STENT PLACEMENT Right 10/25/2013   Procedure: CYSTOSCOPY RIGHT URETEROSCOPY HOLMIUM LASER LITHO AND STENT PLACEMENT;  Surgeon: Fredricka Bonine, MD;  Location: [facility];  Service: Urology;  Laterality: Right;  . HERNIA REPAIR    . INTRAOPERATIVE TRANSESOPHAGEAL ECHOCARDIOGRAM N/A 12/12/2014   Procedure: INTRAOPERATIVE TRANSESOPHAGEAL ECHOCARDIOGRAM;  Surgeon: Burnell Blanks, MD;  Location: [facility];  Service: Cardiovascular;  Laterality: N/A;  . JOINT REPLACEMENT     bil total knees  . KNEE ARTHROPLASTY  02/03/2012   Procedure: COMPUTER ASSISTED TOTAL KNEE ARTHROPLASTY;  Surgeon: Mcarthur Rossetti, MD;  Location: [place];  Service: Orthopedics;  Laterality: Right;  Right total knee arthroplasty  . LEFT AND RIGHT HEART CATHETERIZATION WITH CORONARY ANGIOGRAM N/A 03/17/2013   Procedure: LEFT AND RIGHT HEART CATHETERIZATION WITH CORONARY ANGIOGRAM;  Surgeon: Burnell Blanks, MD;  Location: [facility] CATH LAB;  Service:  Cardiovascular;  Laterality: N/A;  . LEFT AND RIGHT HEART CATHETERIZATION WITH CORONARY/GRAFT ANGIOGRAM N/A 09/14/2014   Procedure: LEFT AND RIGHT HEART CATHETERIZATION WITH Beatrix Fetters;  Surgeon: Burnell Blanks, MD;  Location: [facility] CATH LAB;  Service: Cardiovascular;  Laterality: N/A;  . TEE WITHOUT CARDIOVERSION N/A 03/14/2013   Procedure: TRANSESOPHAGEAL ECHOCARDIOGRAM (TEE);  Surgeon: Lelon Perla, MD;  Location: [facility] ENDOSCOPY;  Service: Cardiovascular;  Laterality: N/A;  . TEE WITHOUT CARDIOVERSION N/A 11/14/2014   Procedure: TRANSESOPHAGEAL ECHOCARDIOGRAM (TEE);  Surgeon: Lelon Perla, MD;  Location: [facility] ENDOSCOPY;  Service: Cardiovascular;  Laterality: N/A;  . TONSILLECTOMY     maybe as a child- does not know  . TOTAL KNEE ARTHROPLASTY  09/10/2012   Procedure: TOTAL KNEE ARTHROPLASTY;  Surgeon: Mcarthur Rossetti, MD;  [facility];  Service: Orthopedics;  Laterality: Left;  . TOTAL KNEE REVISION Right 07/15/2013   Procedure: REVISION ARTHROPLASTY RIGHT KNEE;  Surgeon: Mcarthur Rossetti, MD;  [facility];  Service: Orthopedics;  Laterality: Right;  . TRANSCATHETER AORTIC VALVE REPLACEMENT, TRANSFEMORAL N/A 12/12/2014   Procedure: TRANSCATHETER AORTIC VALVE REPLACEMENT, TRANSFEMORAL;  Surgeon: Burnell Blanks, MD;  Location: [place];  Service: Cardiovascular;  Laterality: N/A;  . TRIGGER FINGER RELEASE  09/10/2012   Procedure: RELEASE TRIGGER FINGER/A-1 PULLEY;  Surgeon: Mcarthur Rossetti, MD;  [facility];  Service: Orthopedics;  Laterality: Right;  Right Ring Finger  . TUBAL LIGATION     Social History   Occupational History  . Occupation: Disabled  Tobacco Use  . Smoking status: Former    Packs/day: 1.50    Years: 30.00    Pack years: 45.00    Types: Cigarettes    Quit date: 12/29/2000    Years since quitting: 20.8  . Smokeless tobacco: Never  Vaping Use  . Vaping Use: Never used  Substance and Sexual Activity  .  Alcohol use: Not Currently  . Drug use: Not Currently  . Sexual activity:  Not Currently    Birth control/protection: Surgical

## 2021-11-04 NOTE — Telephone Encounter (Signed)
Pt called and would like to know the status of the referral that was sent in from Dr. Marlou Sa for pt to get an injections with Dr. Ernestina Patches

## 2021-11-04 NOTE — Patient Instructions (Signed)
Avoid overhead lifting and overhead use of the arms. Do not lift greater than 5 lbs. Adjust head rest in vehicle to prevent hyperextension if rear ended. Take extra precautions to avoid falling. If ESI is of benefit then you may wish to wait on intervention but based on your exam  You have left C7 radiculopathy and if unable to have injection then surgical intervention Would be recommended with C6 corpectomy and fusion C%-C7 with biforamenal decompression at both levels.

## 2021-11-05 ENCOUNTER — Telehealth: Payer: Self-pay | Admitting: Student

## 2021-11-05 NOTE — Telephone Encounter (Signed)
Dr. Collene Gobble, TC to pharmacy, spoke with the pharmacist, Stanton Kidney.  She states the ProAir is not available right now anywhere.  She states she can give the patient the generic, albuterol.  I told her that was fine. Thank you, Luwanna Brossman

## 2021-11-05 NOTE — Telephone Encounter (Signed)
Refill Request  Pt states the pharmacy notified her they will not be able to get the following medication, and will need a different inhaler called in.  PROAIR HFA 108 (90 Base) MCG/ACT inhaler  [place], [place] (Ph: [phone])

## 2021-11-14 ENCOUNTER — Ambulatory Visit (INDEPENDENT_AMBULATORY_CARE_PROVIDER_SITE_OTHER): Payer: Medicaid Other | Admitting: Student

## 2021-11-14 VITALS — BP 157/63 | HR 91 | Ht 63.0 in | Wt 317.5 lb

## 2021-11-14 DIAGNOSIS — F419 Anxiety disorder, unspecified: Secondary | ICD-10-CM | POA: Diagnosis not present

## 2021-11-14 DIAGNOSIS — E1159 Type 2 diabetes mellitus with other circulatory complications: Secondary | ICD-10-CM

## 2021-11-14 DIAGNOSIS — J441 Chronic obstructive pulmonary disease with (acute) exacerbation: Secondary | ICD-10-CM | POA: Diagnosis not present

## 2021-11-14 DIAGNOSIS — F430 Acute stress reaction: Secondary | ICD-10-CM

## 2021-11-14 DIAGNOSIS — E119 Type 2 diabetes mellitus without complications: Secondary | ICD-10-CM

## 2021-11-14 DIAGNOSIS — E785 Hyperlipidemia, unspecified: Secondary | ICD-10-CM

## 2021-11-14 DIAGNOSIS — I2510 Atherosclerotic heart disease of native coronary artery without angina pectoris: Secondary | ICD-10-CM

## 2021-11-14 DIAGNOSIS — I5032 Chronic diastolic (congestive) heart failure: Secondary | ICD-10-CM | POA: Diagnosis not present

## 2021-11-14 DIAGNOSIS — I152 Hypertension secondary to endocrine disorders: Secondary | ICD-10-CM | POA: Diagnosis not present

## 2021-11-14 DIAGNOSIS — Z1211 Encounter for screening for malignant neoplasm of colon: Secondary | ICD-10-CM

## 2021-11-14 DIAGNOSIS — Z0000 Encounter for general adult medical examination without abnormal findings: Secondary | ICD-10-CM

## 2021-11-14 LAB — POCT GLYCOSYLATED HEMOGLOBIN (HGB A1C): Hemoglobin A1C: 7.5 % — AB (ref 4.0–5.6)

## 2021-11-14 LAB — GLUCOSE, CAPILLARY: Glucose-Capillary: 177 mg/dL — ABNORMAL HIGH (ref 70–99)

## 2021-11-14 MED ORDER — BUSPIRONE HCL 5 MG PO TABS: 5.0000 mg | ORAL_TABLET | Freq: Three times a day (TID) | ORAL | 0 refills | Status: DC

## 2021-11-14 NOTE — Patient Instructions (Signed)
[name], it was a pleasure seeing you today!  Today we discussed: - Anxiety: I have prescribed a medicine for you for anxiety. You can take this three times daily.  - I have sent in a re-fill for your blood pressure medications.   I have ordered the following labs today:   Lab Orders         Glucose, capillary         Microalbumin / Creatinine Urine Ratio         POC Hbg A1C      Follow-up: 3 months   Please make sure to arrive 15 minutes prior to your next appointment. If you arrive late, you may be asked to reschedule.   We look forward to seeing you next time. Please call our clinic at [phone] if you have any questions or concerns. The best time to call is Monday-Friday from 9am-4pm, but there is someone available 24/7. If after hours or the weekend, call the main hospital number and ask for the Internal Medicine Resident On-Call. If you need medication refills, please notify your pharmacy one week in advance and they will send [place] a request.  Thank you for letting us take part in your care. Wishing you the best!  Thank you, [name], MD

## 2021-11-15 LAB — MICROALBUMIN / CREATININE URINE RATIO
Creatinine, Urine: 193.3 mg/dL
Microalb/Creat Ratio: 19 mg/g creat (ref 0–29)
Microalbumin, Urine: 37 ug/mL

## 2021-11-17 DIAGNOSIS — F430 Acute stress reaction: Secondary | ICD-10-CM | POA: Insufficient documentation

## 2021-11-17 DIAGNOSIS — Z0000 Encounter for general adult medical examination without abnormal findings: Secondary | ICD-10-CM | POA: Insufficient documentation

## 2021-11-17 MED ORDER — METOPROLOL SUCCINATE ER 50 MG PO TB24: 50.0000 mg | ORAL_TABLET | Freq: Every day | ORAL | 3 refills | Status: DC

## 2021-11-17 MED ORDER — LOSARTAN POTASSIUM-HCTZ 100-25 MG PO TABS: 1.0000 | ORAL_TABLET | Freq: Every day | ORAL | 3 refills | Status: DC

## 2021-11-17 MED ORDER — AMLODIPINE-ATORVASTATIN 5-80 MG PO TABS: 1.0000 | ORAL_TABLET | Freq: Every day | ORAL | 3 refills | Status: DC

## 2021-11-17 NOTE — Assessment & Plan Note (Addendum)
BP Readings from Last 3 Encounters:  11/14/21 (!) 157/63  11/04/21 131/77  10/22/21 138/78   Blood pressure elevated today. Patient reports compliance with medications, although is unsure if she has any left. She denies any chest pain, dyspnea, lightheadedness, syncopal episodes. There are previous mentions of large pill burden with Ms. Dake, suspect patient has not been taking hypertensive medications given these have expired last month. We will re-order them today.  - Losartan-hydrochlorothiazide 100-25mg  daily - Metoprolol succinate 50mg  daily - Amlodipine-atorvastatin 5-80mg  daily - BMET at next visit

## 2021-11-17 NOTE — Assessment & Plan Note (Signed)
This is a stable and chronic issue. Patient currently denies any chest pain or dyspnea.   - Aspirin 81mg  daily - Plavix 75mg  daily - Amlodipine-atorvastatin 5-80mg  daily - Repatha injection q2 weeks - Metoprolol succinate 50mg  daily

## 2021-11-17 NOTE — Assessment & Plan Note (Addendum)
A1c today 7.5% from 7.3%. Patient is not checking sugars regularly but denies any current symptoms including polyuria, polydipsia, and blurry vision. As previously noted, patient has concerns over metformin associated with cancer. We will continue to hold this medication.   - Continue Victoza 1.8mg  daily - If glycemic control worsens, can consider switching to tirzepatide - Urine microalbumin/creatinine today

## 2021-11-17 NOTE — Assessment & Plan Note (Signed)
Patient reports compliance with medications. This is a chronic and stable issue. Last lipid panel 09/2020 revealed LDL 67, at goal for primary prevention. We will continue with current medications. Can consider repeat lipid panel in one year.  - Amlodipine-atorvastatin 5-80mg  daily - Repatha injection q2 weeks

## 2021-11-17 NOTE — Assessment & Plan Note (Signed)
Patient is presenting today for regular follow-up and discuss recent anxiety. Patient reports that she has been very anxious recently regarding her current roommates. She notes she has not been physically or verbally abused and feels she is safe in her residential space. However, she notes her anxiety levels are so high she does not want to leave her bedroom. Also mentions feeling very tense whenever they are around. Denies any heart palpitations, heat/cold intolerance, skin/hair changes, nausea, vomiting, abdominal pain, constipation, or diarrhea.  Unclear whether patient is experiencing generalized anxiety disorder vs stress reaction regarding her current living situation. She is currently taking duloxetine for pain/depression, which she says has been working well for her. We will continue with her SNRI and add anxiolytic. Discussed with patient that we will start with 5mg  three times daily and she can increase to 10mg  three times daily if needed.  - Continue duloxetine 60mg  daily - Start buspirone 5mg  three times daily, can increase to 10mg  three times daily as needed - Consider referral to behavioral health if symptoms continue

## 2021-11-17 NOTE — Assessment & Plan Note (Signed)
-   Colonoscopy ordered - PAP due (last 09/2016 - negative co-testing), patient wishes to defer. Discussed with patient importance of following up with this at her next visit

## 2021-11-17 NOTE — Progress Notes (Signed)
   CC: regular follow-up  HPI:  [name] is a 60 y.o. with chronic diastolic heart failure, hypertension, COPD, OSA, type II diabetes mellitus, aortic stenosis s/p TAVR, history of breast cancer and pulmonary embolism presenting to [facility] for regular follow-up.  Please see problem-based list for further details.   Past Medical History:  Diagnosis Date   Anemia    Aortic valve stenosis, severe    Arthritis    PAIN AND SEVERE OA LEFT KNEE ; S/P RIGHT TKA ON 02/03/12; HAS LOWER BACK PAIN-UNABLE TO STAND MORE THAN 10 MIN; ARTHRITIS "ALL OVER"   Asthma    Blood transfusion    2013[facility]   Breast cancer in female [facility])    Right   CAD (coronary artery disease)    Cath 2010 with DES x 1 RCA-- PT'S CARDIOLOGIST IS DR. MCALHANY   Chronic diastolic congestive heart failure (H[facility])    COPD (chronic obstructive pulmonary disease) ([place])    Depression    Diabetes mellitus DIAGNOSED IN2010   Controll s with diet   Dyspnea    with much ambulation   Eczema    on back   Headache    migraines younger- rare now 02/07/21   Heart murmur    History of hiatal hernia    History of kidney stones    passed or blasted   Hyperlipidemia    Hypertension    Morbid obesity with body mass index of 60.0-69.9 in adult [facility])    Myocardial infarction ([place])    PT THINKS SHE WAS DX WITH MI AT THE TIME OF HEART STENTING   Neuromuscular disorder ([place])    bilateral hands   Personal history of radiation therapy    Pneumonia    Pulmonary embolism ([place]) 02/08/2012   S/P RT TOTAL KNEE ON 02/03/12--ON 02/08/12--DEVELOPED ACUTE SOB AND CHEST PAIN--AND DIAGNOSED WITH  PULMONARY EMBOLUS AND PNEUMONIA   Restless leg syndrome    Sleep apnea    uses 3 liters O2 at night    Uterine fibroid    NO PROBLEMS AT PRESENT FROM THE FIBROIDS-STATES SHE IS POST MENOPAUSAL-LAST MENSES 2010 EXCEPT FOR EPISODE THIS YR OF BLEEDING RELATED TO FIBROIDS.   Weakness    BOTH HANDS - S/P BILATERAL CARPAL TUNNEL RELEASE--BUT STILL HAS  WEAKNESS--OFTEN DROPS THINGS   Review of Systems:  As per HPI  Physical Exam:  Vitals:   11/14/21 1448 11/14/21 1527  BP: (!) 175/96 (!) 157/63  Pulse: 95 91  SpO2: 95%   Weight: (!) 317 lb 8 oz (144 kg)   Height: 5\' 3"  (1.6 m)    General: Resting comfortably in wheelchair in no acute distress CV: Regular rate, rhythm. No murmurs appreciated. Distal pulses 2+ bilaterally. Pulm: Normal work of breathing on room air. Clear to auscultation bilaterally. MSK: Normal bulk, tone. No pitting edema appreciated. Feet: Diminished vibratory sensation in L on toes, medial/lateral malleolus. Diminished vibratory sensation in R toes, but present on medial/lateral malleolus. No open wounds or erythema present. Neuro: Awake, alert, conversing appropriately. No focal deficits.  Psych: Normal mood, affect, speech  Assessment & Plan:   See Encounters Tab for problem based charting.  Patient discussed with Dr. Jimmye Norman

## 2021-11-17 NOTE — Assessment & Plan Note (Signed)
Patient reports compliance with Trelegy. She mentions she uses her ProAir inhaler once daily as needed. She denies any increase in dyspnea, cough, or cough production. We will continue with her current therapy.  - Continue daily Trelegy - ProAir inhaler as needed

## 2021-11-17 NOTE — Assessment & Plan Note (Addendum)
Patient appears euvolemic today and has been compliant with her medications. Mentions she does not take her lasix every day. She denies any dyspnea, orthopnea, chest pain, or increased leg swelling. Last visit with her cardiologist Dr. Angelena Form earlier this year in April. Will continue with current treatment.  - Current GDMT:   -Losartan-HCTZ 100-25mg  daily  -Metoprolol succinate 50mg  daily  - Continue furosemide 40mg  daily as needed if >3lb increase in 24hr or >5lb increase in 3d

## 2021-11-19 NOTE — Progress Notes (Signed)
Internal Medicine Clinic Attending ? ?Case discussed with Dr. Braswell  At the time of the visit.  We reviewed the resident?s history and exam and pertinent patient test results.  I agree with the assessment, diagnosis, and plan of care documented in the resident?s note.  ?

## 2021-11-23 ENCOUNTER — Other Ambulatory Visit: Payer: Self-pay | Admitting: Student

## 2021-11-23 DIAGNOSIS — J441 Chronic obstructive pulmonary disease with (acute) exacerbation: Secondary | ICD-10-CM

## 2021-11-28 ENCOUNTER — Ambulatory Visit: Payer: Medicaid Other | Admitting: Specialist

## 2021-11-28 DIAGNOSIS — Z419 Encounter for procedure for purposes other than remedying health state, unspecified: Secondary | ICD-10-CM | POA: Diagnosis not present

## 2021-12-04 ENCOUNTER — Ambulatory Visit (INDEPENDENT_AMBULATORY_CARE_PROVIDER_SITE_OTHER): Payer: Medicaid Other | Admitting: Orthopedic Surgery

## 2021-12-04 ENCOUNTER — Other Ambulatory Visit: Payer: Self-pay

## 2021-12-04 DIAGNOSIS — M19012 Primary osteoarthritis, left shoulder: Secondary | ICD-10-CM

## 2021-12-07 ENCOUNTER — Encounter: Payer: Self-pay | Admitting: Orthopedic Surgery

## 2021-12-07 NOTE — Progress Notes (Signed)
Office Visit Note   Patient: [name]           Date of Birth: 02/05/1961           MRN: [id number] Visit Date: 12/04/2021 Requested by: [name], MD [place],  [place] [address] PCP: [name], MD  Subjective: Chief Complaint  Patient presents with   Left Shoulder - Follow-up   Neck - Follow-up    HPI: [name] is a 60 year old patient with neck and shoulder pain.  She had a CT scan of the left shoulder in October and MRI scan in July.  Also had MRI scan of the cervical spine done in August.  Cervical spine MRI shows moderate canal stenosis at C6-7.  CT of the left shoulder demonstrates advanced arthritis of the left glenohumeral joint with no gross rotator cuff muscular atrophy or impingement.  CT scan is reviewed with [name] showing the severe arthritis which is present.  Patient describes left shoulder pain radiating into her neck.  Pain started about a year ago.  Gradually getting worse.  Denies any injury or fall.  Injection into the neck has been denied twice.  She is considering surgery for the neck.  She reports tingling and numbness radiating down into both arms.  Tramadol is being used for pain.  She had a glenohumeral joint injection which gave her marginal relief of her overall pain in that left forequarter.  Diabetes is present but well controlled.  She does have a heart valve and is on Plavix for that.  Her husband had a reverse shoulder replacement.  He did reasonably well.              ROS: All systems reviewed are negative as they relate to the chief complaint within the history of present illness.  Patient denies  fevers or chills.   Assessment & Plan: Visit Diagnoses:  1. Primary osteoarthritis, left shoulder     Plan: Impression is left shoulder arthritis and left sided radiculopathy.  She is seeing Dr. Louanne Skye on the 16th where an assessment will be made at that time for or against intervention for the cervical spine.  I think  it would be helpful to get an injection in the neck to try to determine what percentage of her pain is coming from the shoulder and what percentage is coming from the neck.  I think the radicular symptoms going below the elbow, certainly coming from the neck.  We will see what the judgment is from her visit on the 16th regarding appropriateness of neck surgery.  Follow-up with me in the beginning of the year for reevaluation of the shoulder arthritis and suitability for reverse shoulder replacement versus total shoulder replacement at that time.  Follow-Up Instructions: Return in about 8 weeks (around 01/29/2022).   Orders:  No orders of the defined types were placed in this encounter.  No orders of the defined types were placed in this encounter.     Procedures: No procedures performed   Clinical Data: No additional findings.  Objective: Vital Signs: There were no vitals taken for this visit.  Physical Exam:   Constitutional: Patient appears well-developed HEENT:  Head: Normocephalic Eyes:EOM are normal Neck: Normal range of motion Cardiovascular: Normal rate Pulmonary/chest: Effort normal Neurologic: Patient is alert Skin: Skin is warm Psychiatric: Patient has normal mood and affect   Ortho Exam: Ortho exam demonstrates pretty reasonable cervical spine range of motion.  5 out of 5 grip EPL FPL  interosseous wrist flexion extension bicep triceps and deltoid strength.  Rotator cuff strength on the left is pretty good infraspinatus supraspinatus absent muscle testing.  No masses lymphadenopathy or skin changes noted in that shoulder region.  Radial pulse intact bilaterally.  Specialty Comments:  No specialty comments available.  Imaging: No results found.   PMFS History: Patient Active Problem List   Diagnosis Date Noted   Healthcare maintenance 11/17/2021   Stress reaction 11/17/2021   Spondylolisthesis, lumbar region 02/11/2021    Class: Acute   Malignant neoplasm of  upper-outer quadrant of right breast in female, estrogen receptor positive ([place]) 06/10/2018   Restless leg syndrome 02/04/2018   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   Depression 08/06/2015   Hypertension associated with diabetes ([place])    Aortic stenosis    Chronic diastolic congestive heart failure ([place])    COPD (chronic obstructive pulmonary disease) ([place]) 09/21/2013   Obstructive sleep apnea 09/21/2013   Nocturnal hypoxemia 09/12/2013   Hyperlipidemia 11/30/2012   History of pulmonary embolus (PE) 07/09/2012   Normocytic anemia 02/08/2012   Type 2 diabetes mellitus without complication, without long-term current use of insulin ([place]) 02/08/2012   Asthma 02/08/2012   GERD (gastroesophageal reflux disease)    CAD (coronary artery disease) 12/08/2011   Past Medical History:  Diagnosis Date   Anemia    Aortic valve stenosis, severe    Arthritis    PAIN AND SEVERE OA LEFT KNEE ; S/P RIGHT TKA ON 02/03/12; HAS LOWER BACK PAIN-UNABLE TO STAND MORE THAN 10 MIN; ARTHRITIS "ALL OVER"   Asthma    Blood transfusion    2013[facility]   Breast cancer in female [facility])    Right   CAD (coronary artery disease)    Cath 2010 with DES x 1 RCA-- PT'S CARDIOLOGIST IS DR. MCALHANY   Chronic diastolic congestive heart failure (HCC)    COPD (chronic obstructive pulmonary disease) ([place])    Depression    Diabetes mellitus DIAGNOSED IN2010   Controll s with diet   Dyspnea    with much ambulation   Eczema    on back   Headache    migraines younger- rare now 02/07/21   Heart murmur    History of hiatal hernia    History of kidney stones    passed or blasted   Hyperlipidemia    Hypertension    Morbid obesity with body mass index of 60.0-69.9 in adult [facility])    Myocardial infarction ([place])    PT THINKS SHE WAS DX WITH MI AT THE TIME OF HEART STENTING   Neuromuscular disorder ([place])    bilateral hands   Personal history of radiation therapy    Pneumonia    Pulmonary embolism ([place])  02/08/2012   S/P RT TOTAL KNEE ON 02/03/12--ON 02/08/12--DEVELOPED ACUTE SOB AND CHEST PAIN--AND DIAGNOSED WITH  PULMONARY EMBOLUS AND PNEUMONIA   Restless leg syndrome    Sleep apnea    uses 3 liters O2 at night    Uterine fibroid    NO PROBLEMS AT PRESENT FROM THE FIBROIDS-STATES SHE IS POST MENOPAUSAL-LAST MENSES 2010 EXCEPT FOR EPISODE THIS YR OF BLEEDING RELATED TO FIBROIDS.   Weakness    BOTH HANDS - S/P BILATERAL CARPAL TUNNEL RELEASE--BUT STILL HAS WEAKNESS--OFTEN DROPS THINGS    Family History  Problem Relation Age of Onset   Breast cancer Mother        stage IV at diagnosis   Emphysema Mother        smoked  Heart disease Mother    COPD Father        smoked   Asthma Father    Heart disease Father    Cancer Brother        Sinus    Past Surgical History:  Procedure Laterality Date   BREAST BIOPSY Right 06/04/2018   BREAST LUMPECTOMY Right 06/2018   BREAST LUMPECTOMY WITH RADIOACTIVE SEED AND SENTINEL LYMPH NODE BIOPSY Right 07/19/2018   Procedure: BREAST LUMPECTOMY WITH RADIOACTIVE SEED AND SENTINEL LYMPH NODE BIOPSY;  Surgeon: Alphonsa Overall, MD;  Location: [place];  Service: General;  Laterality: Right;   CARDIAC CATHETERIZATION     CARDIAC VALVE REPLACEMENT     2017   CARPAL TUNNEL RELEASE     Bilateral   CHOLECYSTECTOMY     CORONARY ANGIOPLASTY     2010 has stent in place   CYSTOSCOPY W/ RETROGRADES Right 09/21/2013   Procedure: CYSTOSCOPY WITH RIGHT RETROGRADE PYELOGRAM RIGHT DOUBLE J STENT ;  Surgeon: Fredricka Bonine, MD;  [facility];  Service: Urology;  Laterality: Right;   CYSTOSCOPY WITH URETEROSCOPY AND STENT PLACEMENT Right 10/25/2013   Procedure: CYSTOSCOPY RIGHT URETEROSCOPY HOLMIUM LASER LITHO AND STENT PLACEMENT;  Surgeon: Fredricka Bonine, MD;  [facility];  Service: Urology;  Laterality: Right;   HERNIA REPAIR     INTRAOPERATIVE TRANSESOPHAGEAL ECHOCARDIOGRAM N/A 12/12/2014   Procedure: INTRAOPERATIVE TRANSESOPHAGEAL  ECHOCARDIOGRAM;  Surgeon: Burnell Blanks, MD;  Location: [place];  Service: Cardiovascular;  Laterality: N/A;   JOINT REPLACEMENT     bil total knees   KNEE ARTHROPLASTY  02/03/2012   Procedure: COMPUTER ASSISTED TOTAL KNEE ARTHROPLASTY;  Surgeon: Mcarthur Rossetti, MD;  Location: [place];  Service: Orthopedics;  Laterality: Right;  Right total knee arthroplasty   LEFT AND RIGHT HEART CATHETERIZATION WITH CORONARY ANGIOGRAM N/A 03/17/2013   Procedure: LEFT AND RIGHT HEART CATHETERIZATION WITH CORONARY ANGIOGRAM;  Surgeon: Burnell Blanks, MD;  Location: [facility] C[facility] LAB;  Service: Cardiovascular;  Laterality: N/A;   LEFT AND RIGHT HEART CATHETERIZATION WITH CORONARY/GRAFT ANGIOGRAM N/A 09/14/2014   Procedure: LEFT AND RIGHT HEART CATHETERIZATION WITH Beatrix Fetters;  Surgeon: Burnell Blanks, MD;  Location: [facility] CATH LAB;  Service: Cardiovascular;  Laterality: N/A;   TEE WITHOUT CARDIOVERSION N/A 03/14/2013   Procedure: TRANSESOPHAGEAL ECHOCARDIOGRAM (TEE);  Surgeon: Lelon Perla, MD;  Location: [facility] ENDOSCOPY;  Service: Cardiovascular;  Laterality: N/A;   TEE WITHOUT CARDIOVERSION N/A 11/14/2014   Procedure: TRANSESOPHAGEAL ECHOCARDIOGRAM (TEE);  Surgeon: Lelon Perla, MD;  Location: [facility] ENDOSCOPY;  Service: Cardiovascular;  Laterality: N/A;   TONSILLECTOMY     maybe as a child- does not know   TOTAL KNEE ARTHROPLASTY  09/10/2012   Procedure: TOTAL KNEE ARTHROPLASTY;  Surgeon: Mcarthur Rossetti, MD;  [facility];  Service: Orthopedics;  Laterality: Left;   TOTAL KNEE REVISION Right 07/15/2013   Procedure: REVISION ARTHROPLASTY RIGHT KNEE;  Surgeon: Mcarthur Rossetti, MD;  [facility];  Service: Orthopedics;  Laterality: Right;   TRANSCATHETER AORTIC VALVE REPLACEMENT, TRANSFEMORAL N/A 12/12/2014   Procedure: TRANSCATHETER AORTIC VALVE REPLACEMENT, TRANSFEMORAL;  Surgeon: Burnell Blanks, MD;  Location: [place];  Service: Cardiovascular;   Laterality: N/A;   TRIGGER FINGER RELEASE  09/10/2012   Procedure: RELEASE TRIGGER FINGER/A-1 PULLEY;  Surgeon: Mcarthur Rossetti, MD;  [facility];  Service: Orthopedics;  Laterality: Right;  Right Ring Finger   TUBAL LIGATION     Social History   Occupational History   Occupation: Disabled  Tobacco Use   Smoking status: Former    Packs/day: 1.50    Years: 30.00    Pack years: 45.00    Types: Cigarettes    Quit date: 12/29/2000    Years since quitting: 20.9   Smokeless tobacco: Never  Vaping Use   Vaping Use: Never used  Substance and Sexual Activity   Alcohol use: Not Currently   Drug use: Not Currently   Sexual activity: Not Currently    Birth control/protection: Surgical

## 2021-12-09 ENCOUNTER — Other Ambulatory Visit: Payer: Self-pay

## 2021-12-09 DIAGNOSIS — J441 Chronic obstructive pulmonary disease with (acute) exacerbation: Secondary | ICD-10-CM

## 2021-12-10 ENCOUNTER — Other Ambulatory Visit: Payer: Self-pay

## 2021-12-10 DIAGNOSIS — J441 Chronic obstructive pulmonary disease with (acute) exacerbation: Secondary | ICD-10-CM

## 2021-12-10 MED ORDER — PROAIR HFA 108 (90 BASE) MCG/ACT IN AERS: INHALATION_SPRAY | RESPIRATORY_TRACT | 2 refills | Status: DC

## 2021-12-12 ENCOUNTER — Other Ambulatory Visit: Payer: Self-pay | Admitting: Student

## 2021-12-12 DIAGNOSIS — J441 Chronic obstructive pulmonary disease with (acute) exacerbation: Secondary | ICD-10-CM

## 2021-12-12 MED ORDER — ALBUTEROL SULFATE HFA 108 (90 BASE) MCG/ACT IN AERS: 2.0000 | INHALATION_SPRAY | Freq: Four times a day (QID) | RESPIRATORY_TRACT | 3 refills | Status: DC | PRN

## 2021-12-13 ENCOUNTER — Encounter: Payer: Self-pay | Admitting: Specialist

## 2021-12-13 ENCOUNTER — Ambulatory Visit (INDEPENDENT_AMBULATORY_CARE_PROVIDER_SITE_OTHER): Payer: Medicaid Other | Admitting: Specialist

## 2021-12-13 ENCOUNTER — Other Ambulatory Visit: Payer: Self-pay

## 2021-12-13 VITALS — BP 147/81 | HR 156 | Ht 63.0 in | Wt 317.0 lb

## 2021-12-13 DIAGNOSIS — R202 Paresthesia of skin: Secondary | ICD-10-CM

## 2021-12-13 DIAGNOSIS — M542 Cervicalgia: Secondary | ICD-10-CM

## 2021-12-13 DIAGNOSIS — M4802 Spinal stenosis, cervical region: Secondary | ICD-10-CM

## 2021-12-13 DIAGNOSIS — M4722 Other spondylosis with radiculopathy, cervical region: Secondary | ICD-10-CM | POA: Diagnosis not present

## 2021-12-13 DIAGNOSIS — M19012 Primary osteoarthritis, left shoulder: Secondary | ICD-10-CM | POA: Diagnosis not present

## 2021-12-13 DIAGNOSIS — R200 Anesthesia of skin: Secondary | ICD-10-CM

## 2021-12-13 NOTE — Patient Instructions (Signed)
Avoid overhead lifting and overhead use of the arms. Do not lift greater than 5 lbs. Adjust head rest in vehicle to prevent hyperextension if rear ended. Take extra precautions to avoid falling, EMG/NCV of the left arm ordered, previous right arm studies were okay. Left shoulder arthroplasty  for severe left shoulder OA.

## 2021-12-13 NOTE — Progress Notes (Signed)
Office Visit Note   Patient: [name]           Date of Birth: 1961/10/26           MRN: [id number] Visit Date: 12/13/2021              Requested by: [name], MD [address] [place],  [place] [address] PCP: [name], MD   Assessment & Plan: Visit Diagnoses:  1. Other spondylosis with radiculopathy, cervical region   2. Cervicalgia   3. Spinal stenosis of cervical region   4. Primary osteoarthritis, left shoulder   5. Numbness and tingling in left arm     Plan: Avoid overhead lifting and overhead use of the arms. Do not lift greater than 5 lbs. Adjust head rest in vehicle to prevent hyperextension if rear ended. Take extra precautions to avoid falling, EMG/NCV of the left arm ordered, previous right arm studies were okay. Left shoulder arthroplasty  for severe left shoulder OA.  Follow-Up Instructions: Return in about 3 weeks (around 01/03/2022).   Orders:  Orders Placed This Encounter  Procedures   Ambulatory referral to Physical Medicine Rehab   No orders of the defined types were placed in this encounter.     Procedures: No procedures performed   Clinical Data: No additional findings.   Subjective: Chief Complaint  Patient presents with   Neck - Follow-up    Not doing well at all, in a lot of pain   Lower Back - Follow-up    Doing good overall, gets sore at times and takes tramadol when it gets bad     HPI  Review of Systems   Objective: Vital Signs: BP (!) 147/81 (BP Location: Left Arm, Patient Position: Sitting)    Pulse (!) 156    Ht 5\' 3"  (1.6 m)    Wt (!) 317 lb (143.8 kg)    BMI 56.15 kg/m   Physical Exam  Ortho Exam  Specialty Comments:  No specialty comments available.  Imaging: No results found.   PMFS History: Patient Active Problem List   Diagnosis Date Noted   Spondylolisthesis, lumbar region 02/11/2021    Priority: High    Class: Acute   Healthcare maintenance 11/17/2021   Stress reaction  11/17/2021   Malignant neoplasm of upper-outer quadrant of right breast in female, estrogen receptor positive ([place]) 06/10/2018   Restless leg syndrome 02/04/2018   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   Depression 08/06/2015   Hypertension associated with diabetes ([place])    Aortic stenosis    Chronic diastolic congestive heart failure ([place])    COPD (chronic obstructive pulmonary disease) ([place]) 09/21/2013   Obstructive sleep apnea 09/21/2013   Nocturnal hypoxemia 09/12/2013   Hyperlipidemia 11/30/2012   History of pulmonary embolus (PE) 07/09/2012   Normocytic anemia 02/08/2012   Type 2 diabetes mellitus without complication, without long-term current use of insulin ([place]) 02/08/2012   Asthma 02/08/2012   GERD (gastroesophageal reflux disease)    CAD (coronary artery disease) 12/08/2011   Past Medical History:  Diagnosis Date   Anemia    Aortic valve stenosis, severe    Arthritis    PAIN AND SEVERE OA LEFT KNEE ; S/P RIGHT TKA ON 02/03/12; HAS LOWER BACK PAIN-UNABLE TO STAND MORE THAN 10 MIN; ARTHRITIS "ALL OVER"   Asthma    Blood transfusion    [facility]   Breast cancer in female [facility])    Right   CAD (coronary artery disease)  Cath 2010 with DES x 1 RCA-- PT'S CARDIOLOGIST IS DR. MCALHANY   Chronic diastolic congestive heart failure (HCC)    COPD (chronic obstructive pulmonary disease) ([place])    Depression    Diabetes mellitus DIAGNOSED IN2010   Controll s with diet   Dyspnea    with much ambulation   Eczema    on back   Headache    migraines younger- rare now 02/07/21   Heart murmur    History of hiatal hernia    History of kidney stones    passed or blasted   Hyperlipidemia    Hypertension    Morbid obesity with body mass index of 60.0-69.9 in adult [facility])    Myocardial infarction ([place])    PT THINKS SHE WAS DX WITH MI AT THE TIME OF HEART STENTING   Neuromuscular disorder ([place])    bilateral hands   Personal history of radiation therapy    Pneumonia     Pulmonary embolism ([place]) 02/08/2012   S/P RT TOTAL KNEE ON 02/03/12--ON 02/08/12--DEVELOPED ACUTE SOB AND CHEST PAIN--AND DIAGNOSED WITH  PULMONARY EMBOLUS AND PNEUMONIA   Restless leg syndrome    Sleep apnea    uses 3 liters O2 at night    Uterine fibroid    NO PROBLEMS AT PRESENT FROM THE FIBROIDS-STATES SHE IS POST MENOPAUSAL-LAST MENSES 2010 EXCEPT FOR EPISODE THIS YR OF BLEEDING RELATED TO FIBROIDS.   Weakness    BOTH HANDS - S/P BILATERAL CARPAL TUNNEL RELEASE--BUT STILL HAS WEAKNESS--OFTEN DROPS THINGS    Family History  Problem Relation Age of Onset   Breast cancer Mother        stage IV at diagnosis   Emphysema Mother        smoked   Heart disease Mother    COPD Father        smoked   Asthma Father    Heart disease Father    Cancer Brother        Sinus    Past Surgical History:  Procedure Laterality Date   BREAST BIOPSY Right 06/04/2018   BREAST LUMPECTOMY Right 06/2018   BREAST LUMPECTOMY WITH RADIOACTIVE SEED AND SENTINEL LYMPH NODE BIOPSY Right 07/19/2018   Procedure: BREAST LUMPECTOMY WITH RADIOACTIVE SEED AND SENTINEL LYMPH NODE BIOPSY;  Surgeon: Alphonsa Overall, MD;  Location: [place] [facility];  Service: General;  Laterality: Right;   CARDIAC CATHETERIZATION     CARDIAC VALVE REPLACEMENT     2017   CARPAL TUNNEL RELEASE     Bilateral   CHOLECYSTECTOMY     CORONARY ANGIOPLASTY     2010 has stent in place   CYSTOSCOPY W/ RETROGRADES Right 09/21/2013   Procedure: CYSTOSCOPY WITH RIGHT RETROGRADE PYELOGRAM RIGHT DOUBLE J STENT ;  Surgeon: Fredricka Bonine, MD;  [facility];  Service: Urology;  Laterality: Right;   CYSTOSCOPY WITH URETEROSCOPY AND STENT PLACEMENT Right 10/25/2013   Procedure: CYSTOSCOPY RIGHT URETEROSCOPY HOLMIUM LASER LITHO AND STENT PLACEMENT;  Surgeon: Fredricka Bonine, MD;  [facility];  Service: Urology;  Laterality: Right;   HERNIA REPAIR     INTRAOPERATIVE TRANSESOPHAGEAL ECHOCARDIOGRAM N/A 12/12/2014   Procedure: INTRAOPERATIVE  TRANSESOPHAGEAL ECHOCARDIOGRAM;  Surgeon: Burnell Blanks, MD;  Location: [place];  Service: Cardiovascular;  Laterality: N/A;   JOINT REPLACEMENT     bil total knees   KNEE ARTHROPLASTY  02/03/2012   Procedure: COMPUTER ASSISTED TOTAL KNEE ARTHROPLASTY;  Surgeon: Mcarthur Rossetti, MD;  Location: [place];  Service: Orthopedics;  Laterality: Right;  Right total knee arthroplasty   LEFT AND RIGHT HEART CATHETERIZATION WITH CORONARY ANGIOGRAM N/A 03/17/2013   Procedure: LEFT AND RIGHT HEART CATHETERIZATION WITH CORONARY ANGIOGRAM;  Surgeon: Burnell Blanks, MD;  [facility] CATH LAB;  Service: Cardiovascular;  Laterality: N/A;   LEFT AND RIGHT HEART CATHETERIZATION WITH CORONARY/GRAFT ANGIOGRAM N/A 09/14/2014   Procedure: LEFT AND RIGHT HEART CATHETERIZATION WITH Beatrix Fetters;  Surgeon: Burnell Blanks, MD;  Location: [facility] CATH LAB;  Service: Cardiovascular;  Laterality: N/A;   TEE WITHOUT CARDIOVERSION N/A 03/14/2013   Procedure: TRANSESOPHAGEAL ECHOCARDIOGRAM (TEE);  Surgeon: Lelon Perla, MD;  Location: [facility] ENDOSCOPY;  Service: Cardiovascular;  Laterality: N/A;   TEE WITHOUT CARDIOVERSION N/A 11/14/2014   Procedure: TRANSESOPHAGEAL ECHOCARDIOGRAM (TEE);  Surgeon: Lelon Perla, MD;  Location: [facility] ENDOSCOPY;  Service: Cardiovascular;  Laterality: N/A;   TONSILLECTOMY     maybe as a child- does not know   TOTAL KNEE ARTHROPLASTY  09/10/2012   Procedure: TOTAL KNEE ARTHROPLASTY;  Surgeon: Mcarthur Rossetti, MD;  [facility];  Service: Orthopedics;  Laterality: Left;   TOTAL KNEE REVISION Right 07/15/2013   Procedure: REVISION ARTHROPLASTY RIGHT KNEE;  Surgeon: Mcarthur Rossetti, MD;  [facility];  Service: Orthopedics;  Laterality: Right;   TRANSCATHETER AORTIC VALVE REPLACEMENT, TRANSFEMORAL N/A 12/12/2014   Procedure: TRANSCATHETER AORTIC VALVE REPLACEMENT, TRANSFEMORAL;  Surgeon: Burnell Blanks, MD;  Location: [place];  Service:  Cardiovascular;  Laterality: N/A;   TRIGGER FINGER RELEASE  09/10/2012   Procedure: RELEASE TRIGGER FINGER/A-1 PULLEY;  Surgeon: Mcarthur Rossetti, MD;  [facility];  Service: Orthopedics;  Laterality: Right;  Right Ring Finger   TUBAL LIGATION     Social History   Occupational History   Occupation: Disabled  Tobacco Use   Smoking status: Former    Packs/day: 1.50    Years: 30.00    Pack years: 45.00    Types: Cigarettes    Quit date: 12/29/2000    Years since quitting: 20.9   Smokeless tobacco: Never  Vaping Use   Vaping Use: Never used  Substance and Sexual Activity   Alcohol use: Not Currently   Drug use: Not Currently   Sexual activity: Not Currently    Birth control/protection: Surgical

## 2021-12-20 ENCOUNTER — Other Ambulatory Visit: Payer: Self-pay | Admitting: Specialist

## 2021-12-20 ENCOUNTER — Other Ambulatory Visit: Payer: Self-pay | Admitting: Student

## 2021-12-20 DIAGNOSIS — F419 Anxiety disorder, unspecified: Secondary | ICD-10-CM

## 2021-12-29 DIAGNOSIS — Z419 Encounter for procedure for purposes other than remedying health state, unspecified: Secondary | ICD-10-CM | POA: Diagnosis not present

## 2022-01-01 ENCOUNTER — Ambulatory Visit (INDEPENDENT_AMBULATORY_CARE_PROVIDER_SITE_OTHER): Payer: Medicaid Other | Admitting: Physical Medicine and Rehabilitation

## 2022-01-01 ENCOUNTER — Other Ambulatory Visit: Payer: Self-pay

## 2022-01-01 DIAGNOSIS — R202 Paresthesia of skin: Secondary | ICD-10-CM

## 2022-01-01 NOTE — Progress Notes (Signed)
Pt state left shoulder and arm numbness. Pt state she doesn't know what cause the numbness. Pt state right handed.  Numeric Pain Rating Scale and Functional Assessment Average Pain 3   In the last MONTH (on 0-10 scale) has pain interfered with the following?  1. General activity like being  able to carry out your everyday physical activities such as walking, climbing stairs, carrying groceries, or moving a chair?  Rating(10)

## 2022-01-02 NOTE — Procedures (Signed)
EMG & NCV Findings: Evaluation of the left ulnar sensory nerve showed reduced amplitude (14.0 V).  All remaining nerves (as indicated in the following tables) were within normal limits.    All examined muscles (as indicated in the following table) showed no evidence of electrical instability.    Impression: Essentially NORMAL electrodiagnostic study of the left upper limb.  There is no significant electrodiagnostic evidence of nerve entrapment, brachial plexopathy, cervical radiculopathy or generalized peripheral neuropathy.    As you know, purely sensory or demyelinating radiculopathies and chemical radiculitis may not be detected with this particular electrodiagnostic study.  Recommendations: 1.  Follow-up with referring physician. 2.  Continue current management of symptoms.  ___________________________ Laurence Spates FAAPMR Board Certified, American Board of Physical Medicine and Rehabilitation    Nerve Conduction Studies Anti Sensory Summary Table   Stim Site NR Peak (ms) Norm Peak (ms) P-T Amp (V) Norm P-T Amp Site1 Site2 Delta-P (ms) Dist (cm) Vel (m/s) Norm Vel (m/s)  Left Median Acr Palm Anti Sensory (2nd Digit)  31.6C  Wrist    3.4 <3.6 38.9 >10 Wrist Palm 2.0 0.0    Palm    1.4 <2.0 19.5         Left Radial Anti Sensory (Base 1st Digit)  32.3C  Wrist    2.0 <3.1 25.1  Wrist Base 1st Digit 2.0 0.0    Left Ulnar Anti Sensory (5th Digit)  32.4C  Wrist    3.1 <3.7 *14.0 >15.0 Wrist 5th Digit 3.1 14.0 45 >38   Motor Summary Table   Stim Site NR Onset (ms) Norm Onset (ms) O-P Amp (mV) Norm O-P Amp Site1 Site2 Delta-0 (ms) Dist (cm) Vel (m/s) Norm Vel (m/s)  Left Median Motor (Abd Poll Brev)  32.6C  Wrist    3.4 <4.2 6.9 >5 Elbow Wrist 3.9 20.5 53 >50  Elbow    7.3  4.8         Left Ulnar Motor (Abd Dig Min)  32.7C  Wrist    3.3 <4.2 6.2 >3 B Elbow Wrist 3.8 19.0 53 >53  B Elbow    7.1  2.7  A Elbow B Elbow 1.9 10.0 53 >53  A Elbow    9.0  4.4          EMG   Side  Muscle Nerve Root Ins Act Fibs Psw Amp Dur Poly Recrt Int Fraser Din Comment  Left 1stDorInt Ulnar C8-T1 Nml Nml Nml Nml Nml 0 Nml Nml   Left Abd Poll Brev Median C8-T1 Nml Nml Nml Nml Nml 0 Nml Nml   Left ExtDigCom   Nml Nml Nml Nml Nml 0 Nml Nml   Left Triceps Radial C6-7-8 Nml Nml Nml Nml Nml 0 Nml Nml   Left Deltoid Axillary C5-6 Nml Nml Nml Nml Nml 0 Nml Nml     Nerve Conduction Studies Anti Sensory Left/Right Comparison   Stim Site L Lat (ms) R Lat (ms) L-R Lat (ms) L Amp (V) R Amp (V) L-R Amp (%) Site1 Site2 L Vel (m/s) R Vel (m/s) L-R Vel (m/s)  Median Acr Palm Anti Sensory (2nd Digit)  31.6C  Wrist 3.4   38.9   Wrist Palm     Palm 1.4   19.5         Radial Anti Sensory (Base 1st Digit)  32.3C  Wrist 2.0   25.1   Wrist Base 1st Digit     Ulnar Anti Sensory (5th Digit)  32.4C  Wrist 3.1   *  14.0   Wrist 5th Digit 45     Motor Left/Right Comparison   Stim Site L Lat (ms) R Lat (ms) L-R Lat (ms) L Amp (mV) R Amp (mV) L-R Amp (%) Site1 Site2 L Vel (m/s) R Vel (m/s) L-R Vel (m/s)  Median Motor (Abd Poll Brev)  32.6C  Wrist 3.4   6.9   Elbow Wrist 53    Elbow 7.3   4.8         Ulnar Motor (Abd Dig Min)  32.7C  Wrist 3.3   6.2   B Elbow Wrist *50    B Elbow 7.1   2.7   A Elbow B Elbow 53    A Elbow 9.0   4.4            Waveforms:

## 2022-01-02 NOTE — Progress Notes (Signed)
[name] - 61 y.o. female MRN [id number]  Date of birth: November 21, 1961  Office Visit Note: Visit Date: 01/01/2022 PCP: [name], MD Referred by: [name], MD  Subjective: Chief Complaint  Patient presents with   Left Shoulder - Numbness   Left Arm - Numbness   HPI:  [name] is a 61 y.o. female who comes in today at the request of Dr. [name] for electrodiagnostic study of the Left upper extremities.  Patient is Right hand dominant.  She reports chronic worsening severe left shoulder pain with nondermatomal arm numbness and tingling.  She does have type 2 diabetes without insulin use.  She reports this is under control.  Prior electrodiagnostic study on the right in June of this year was normal.  She has had bilateral carpal tunnel release many years ago.  ROS Otherwise per HPI.  Assessment & Plan: Visit Diagnoses:    ICD-10-CM   1. Paresthesia of skin  R20.2 NCV with EMG (electromyography)      Plan: Impression: Essentially NORMAL electrodiagnostic study of the left upper limb.  There is no significant electrodiagnostic evidence of nerve entrapment, brachial plexopathy, cervical radiculopathy or generalized peripheral neuropathy.    As you know, purely sensory or demyelinating radiculopathies and chemical radiculitis may not be detected with this particular electrodiagnostic study.  Recommendations: 1.  Follow-up with referring physician. 2.  Continue current management of symptoms.  Meds & Orders: No orders of the defined types were placed in this encounter.   Orders Placed This Encounter  Procedures   NCV with EMG (electromyography)    Follow-up: Return in about 2 weeks (around 01/15/2022) for [name], MD.   Procedures: No procedures performed  EMG & NCV Findings: Evaluation of the left ulnar sensory nerve showed reduced amplitude (14.0 V).  All remaining nerves (as indicated in the following tables) were within normal limits.     All examined muscles (as indicated in the following table) showed no evidence of electrical instability.    Impression: Essentially NORMAL electrodiagnostic study of the left upper limb.  There is no significant electrodiagnostic evidence of nerve entrapment, brachial plexopathy, cervical radiculopathy or generalized peripheral neuropathy.    As you know, purely sensory or demyelinating radiculopathies and chemical radiculitis may not be detected with this particular electrodiagnostic study.  Recommendations: 1.  Follow-up with referring physician. 2.  Continue current management of symptoms.  ___________________________ Laurence Spates FAAPMR Board Certified, American Board of Physical Medicine and Rehabilitation    Nerve Conduction Studies Anti Sensory Summary Table   Stim Site NR Peak (ms) Norm Peak (ms) P-T Amp (V) Norm P-T Amp Site1 Site2 Delta-P (ms) Dist (cm) Vel (m/s) Norm Vel (m/s)  Left Median Acr Palm Anti Sensory (2nd Digit)  31.6C  Wrist    3.4 <3.6 38.9 >10 Wrist Palm 2.0 0.0    Palm    1.4 <2.0 19.5         Left Radial Anti Sensory (Base 1st Digit)  32.3C  Wrist    2.0 <3.1 25.1  Wrist Base 1st Digit 2.0 0.0    Left Ulnar Anti Sensory (5th Digit)  32.4C  Wrist    3.1 <3.7 *14.0 >15.0 Wrist 5th Digit 3.1 14.0 45 >38   Motor Summary Table   Stim Site NR Onset (ms) Norm Onset (ms) O-P Amp (mV) Norm O-P Amp Site1 Site2 Delta-0 (ms) Dist (cm) Vel (m/s) Norm Vel (m/s)  Left Median Motor (Abd Poll Brev)  32.6C  Wrist    3.4 <4.2 6.9 >5 Elbow Wrist 3.9 20.5 53 >50  Elbow    7.3  4.8         Left Ulnar Motor (Abd Dig Min)  32.7C  Wrist    3.3 <4.2 6.2 >3 B Elbow Wrist 3.8 19.0 53 >53  B Elbow    7.1  2.7  A Elbow B Elbow 1.9 10.0 53 >53  A Elbow    9.0  4.4          EMG   Side Muscle Nerve Root Ins Act Fibs Psw Amp Dur Poly Recrt Int Fraser Din Comment  Left 1stDorInt Ulnar C8-T1 Nml Nml Nml Nml Nml 0 Nml Nml   Left Abd Poll Brev Median C8-T1 Nml Nml Nml Nml Nml 0 Nml  Nml   Left ExtDigCom   Nml Nml Nml Nml Nml 0 Nml Nml   Left Triceps Radial C6-7-8 Nml Nml Nml Nml Nml 0 Nml Nml   Left Deltoid Axillary C5-6 Nml Nml Nml Nml Nml 0 Nml Nml     Nerve Conduction Studies Anti Sensory Left/Right Comparison   Stim Site L Lat (ms) R Lat (ms) L-R Lat (ms) L Amp (V) R Amp (V) L-R Amp (%) Site1 Site2 L Vel (m/s) R Vel (m/s) L-R Vel (m/s)  Median Acr Palm Anti Sensory (2nd Digit)  31.6C  Wrist 3.4   38.9   Wrist Palm     Palm 1.4   19.5         Radial Anti Sensory (Base 1st Digit)  32.3C  Wrist 2.0   25.1   Wrist Base 1st Digit     Ulnar Anti Sensory (5th Digit)  32.4C  Wrist 3.1   *14.0   Wrist 5th Digit 45     Motor Left/Right Comparison   Stim Site L Lat (ms) R Lat (ms) L-R Lat (ms) L Amp (mV) R Amp (mV) L-R Amp (%) Site1 Site2 L Vel (m/s) R Vel (m/s) L-R Vel (m/s)  Median Motor (Abd Poll Brev)  32.6C  Wrist 3.4   6.9   Elbow Wrist 53    Elbow 7.3   4.8         Ulnar Motor (Abd Dig Min)  32.7C  Wrist 3.3   6.2   B Elbow Wrist *50    B Elbow 7.1   2.7   A Elbow B Elbow 53    A Elbow 9.0   4.4            Waveforms:             Clinical History: EMG & NCV Findings: All nerve conduction studies (as indicated in the following tables) were within normal limits.     All examined muscles (as indicated in the following table) showed no evidence of electrical instability.     Impression: Essentially NORMAL electrodiagnostic study of the right upper limb.  There is no significant electrodiagnostic evidence of nerve entrapment, brachial plexopathy or cervical radiculopathy.     As you know, purely sensory or demyelinating radiculopathies and chemical radiculitis may not be detected with this particular electrodiagnostic study.   Recommendations: 1.  Follow-up with referring physician. 2.  Continue current management of symptoms.   ___________________________ Wonda Olds Board Certified, American Board of Physical Medicine and  Rehabilitation ------  MRI LUMBAR SPINE WITHOUT CONTRAST       IMPRESSION:  1. No acute osseous abnormality in the lumbar spine. But grade 1  spondylolisthesis  at L4-L5 and L5-S1 as seen on the weight-bearing  radiographs this year is associated with severe chronic facet  arthropathy. No spinal stenosis, but mild to moderate L4 and L5  foraminal stenosis.     2. 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 No conus mass effect. Mild  to moderate left T12 neural foraminal stenosis.     3. Mild for age lumbar spine degeneration elsewhere.     On: 05/23/2020 09:37 -------------------------------------------------------------  MRI CERVICAL SPINE WITHOUT CONTRAST   Cord: Motion limited evaluation without evidence of abnormal cord signal.   Posterior Fossa, vertebral arteries, paraspinal tissues: The visualized vertebral artery flow voids are grossly maintained. Grossly unremarkable visualized posterior fossa.   C2-C3: Mild left facet hypertrophy without significant canal or foraminal stenosis.   C3-C4: Posterior disc osteophyte complex and bilateral facet/uncovertebral hypertrophy. Limited evaluation of foramina due to motion with suspected mild bilateral foraminal stenosis. No significant canal stenosis.   C4-C5: Small posterior disc osteophyte complex. Bilateral facet/uncovertebral hypertrophy. Motion limited evaluation of the foramina with suspected mild bilateral foraminal stenosis. No significant canal stenosis.   C5-C6: Posterior disc osteophyte complex with left greater than right facet and uncovertebral hypertrophy. Significantly motion limited evaluation of foramina with potentially mild left greater than right foraminal stenosis. Likely mild canal stenosis.   C6-C7: Posterior disc osteophyte complex and ligamentum flavum thickening with likely mild to moderate canal stenosis. Bilateral facet  uncovertebral hypertrophy. Significantly motion limited evaluation of foramina with potentially moderate bilateral foraminal stenosis.   C7-T1: Significantly motion limited evaluation without evidence of significant canal stenosis.   IMPRESSION: Moderate to severe motion degradation. Likely mild-to-moderate canal stenosis C6-C7 and mild canal stenosis at C5-C6. Potentially moderate foraminal stenosis at C6-C7 and mild foraminal stenosis at C5-C6, although confident assessment of the foramina is not possible due to motion. A CT myelogram or repeat MRI with sedation may be helpful to better characterize, if clinically indicated.   On: 08/26/2021 11:27     Objective:  VS:  HT:     WT:    BMI:      BP:    HR: bpm   TEMP: ( )   RESP:  Physical Exam Musculoskeletal:        General: No swelling, tenderness or deformity.     Comments: Inspection reveals no atrophy of the bilateral APB or FDI or hand intrinsics. There is no swelling, color changes, allodynia or dystrophic changes. There is 5 out of 5 strength in the bilateral wrist extension, finger abduction and long finger flexion. There is intact sensation to light touch in all dermatomal and peripheral nerve distributions. There is a negative Phalen's test bilaterally. There is a negative Hoffmann's test bilaterally.  Skin:    General: Skin is warm and dry.     Findings: No erythema or rash.  Neurological:     General: No focal deficit present.     Mental Status: She is alert and oriented to person, place, and time.     Motor: No weakness or abnormal muscle tone.     Coordination: Coordination normal.  Psychiatric:        Mood and Affect: Mood normal.        Behavior: Behavior normal.     Imaging: No results found.

## 2022-01-13 ENCOUNTER — Ambulatory Visit (INDEPENDENT_AMBULATORY_CARE_PROVIDER_SITE_OTHER): Payer: Medicaid Other | Admitting: Specialist

## 2022-01-13 ENCOUNTER — Encounter: Payer: Self-pay | Admitting: Specialist

## 2022-01-13 ENCOUNTER — Other Ambulatory Visit: Payer: Self-pay

## 2022-01-13 VITALS — BP 147/66 | HR 92 | Ht 63.0 in | Wt 317.0 lb

## 2022-01-13 DIAGNOSIS — R202 Paresthesia of skin: Secondary | ICD-10-CM | POA: Diagnosis not present

## 2022-01-13 DIAGNOSIS — M542 Cervicalgia: Secondary | ICD-10-CM | POA: Diagnosis not present

## 2022-01-13 DIAGNOSIS — R200 Anesthesia of skin: Secondary | ICD-10-CM

## 2022-01-13 DIAGNOSIS — R29898 Other symptoms and signs involving the musculoskeletal system: Secondary | ICD-10-CM | POA: Diagnosis not present

## 2022-01-13 NOTE — Progress Notes (Signed)
Office Visit Note   Patient: [name]           Date of Birth: 04/16/1961           MRN: [id number] Visit Date: 01/13/2022              Requested by: [name], MD [address] [place],  [place] [address] PCP: [name], MD   Assessment & Plan: Visit Diagnoses:  1. Cervicalgia   2. Weakness of shoulder   3. Numbness and tingling in left arm     Plan: Avoid overhead lifting and overhead use of the arms. Do not lift greater than 5 lbs. Adjust head rest in vehicle to prevent hyperextension if rear ended. Take extra precautions to avoid falling. Referral to PT for cervical traction and McKenzie exercises.. Follow-Up Instructions: No follow-ups on file.   Orders:  Orders Placed This Encounter  Procedures   Ambulatory referral to Physical Therapy   No orders of the defined types were placed in this encounter.     Procedures: No procedures performed   Clinical Data: Findings:  IMPRESSION: Moderate to severe motion degradation. Likely mild-to-moderate canal stenosis C6-C7 and mild canal stenosis at C5-C6. Potentially moderate foraminal stenosis at C6-C7 and mild foraminal stenosis at C5-C6, although confident assessment of the foramina is not possible due to motion. A CT myelogram or repeat MRI with sedation may be helpful to better characterize, if clinically indicated.     Electronically Signed   By: Margaretha Sheffield M.D.   On: 08/26/2021 11:27   Subjective: Chief Complaint  Patient presents with   Left Arm - Follow-up    EMG/NCS Review    61 year old right handed female with left shoulder pain, EMG/NCV negative for changes in the left arm, no carpal tunnel or cubital tunnel syndrome. She has undergone previous  bilateral CTR with good relief. Just recently increased numbness into the hands. Told CTS could recur. Has pain that localizes to the left shoulder. There is pain with sleeping on the left side. Dr. Marlou Sa is helping her with her  shoulder. Has under gone injection of the left shoulder without relief.   Review of Systems  Constitutional: Negative.   HENT: Negative.    Eyes: Negative.   Respiratory: Negative.    Cardiovascular: Negative.   Gastrointestinal: Negative.   Endocrine: Negative.   Genitourinary: Negative.   Musculoskeletal: Negative.   Skin: Negative.   Allergic/Immunologic: Negative.   Neurological: Negative.   Hematological: Negative.   Psychiatric/Behavioral: Negative.      Objective: Vital Signs: BP (!) 147/66 (BP Location: Left Arm, Patient Position: Sitting)    Pulse 92    Ht 5\' 3"  (1.6 m)    Wt (!) 317 lb (143.8 kg)    BMI 56.15 kg/m   Physical Exam Constitutional:      Appearance: She is well-developed.  HENT:     Head: Normocephalic and atraumatic.  Eyes:     Pupils: Pupils are equal, round, and reactive to light.  Pulmonary:     Effort: Pulmonary effort is normal.     Breath sounds: Normal breath sounds.  Abdominal:     General: Bowel sounds are normal.     Palpations: Abdomen is soft.  Musculoskeletal:     Cervical back: Normal range of motion and neck supple.  Skin:    General: Skin is warm and dry.  Neurological:     Mental Status: She is alert and oriented to person, place,  and time.  Psychiatric:        Behavior: Behavior normal.        Thought Content: Thought content normal.        Judgment: Judgment normal.    Back Exam   Tenderness  The patient is experiencing tenderness in the cervical.  Range of Motion  Extension:  abnormal  Flexion:  abnormal  Lateral bend right:  abnormal  Lateral bend left:  abnormal   Muscle Strength  Right Quadriceps:  5/5  Left Quadriceps:  5/5  Right Hamstrings:  5/5  Left Hamstrings:  5/5   Reflexes  Patellar:  0/4 Achilles:  0/4 Babinski's sign: normal      Specialty Comments:  No specialty comments available.  Imaging: No results found.   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   Depression 08/06/2015   Hypertension associated with diabetes ([place])    Aortic stenosis    Chronic diastolic congestive heart failure ([place])    COPD (chronic obstructive pulmonary disease) ([place]) 09/21/2013   Obstructive sleep apnea 09/21/2013   Nocturnal hypoxemia 09/12/2013   Hyperlipidemia 11/30/2012   History of pulmonary embolus ([facility]) 07/09/2012   Normocytic anemia 02/08/2012   Type 2 diabetes mellitus without complication, without long-term current use of insulin ([place]) 02/08/2012   Asthma 02/08/2012   GERD (gastroesophageal reflux disease)    CAD (coronary artery disease) 12/08/2011   Past Medical History:  Diagnosis Date   Anemia    Aortic valve stenosis, severe    Arthritis    PAIN AND SEVERE OA LEFT KNEE ; S/P RIGHT TKA ON 02/03/12; HAS LOWER BACK PAIN-UNABLE TO STAND MORE THAN 10 MIN; ARTHRITIS "ALL OVER"   Asthma    Blood transfusion    [facility]   Breast cancer in female [facility])    Right   CAD (coronary artery disease)    Cath 2010 with DES x 1 RCA-- PT'S CARDIOLOGIST IS DR. MCALHANY   Chronic diastolic congestive heart failure (HCC)    COPD (chronic obstructive pulmonary disease) ([place])    Depression    Diabetes mellitus DIAGNOSED IN2010   Controll s with diet   Dyspnea    with much ambulation   Eczema    on back   Headache    migraines younger- rare now 02/07/21   Heart murmur    History of hiatal hernia    History of kidney stones    passed or blasted   Hyperlipidemia    Hypertension    Morbid obesity with body mass index of 60.0-69.9 in adult [facility])    Myocardial infarction ([place])    PT THINKS SHE WAS DX WITH MI  AT THE TIME OF HEART STENTING   Neuromuscular disorder (HCC)    bilateral hands   Personal history of radiation therapy    Pneumonia    Pulmonary embolism ([place]) 02/08/2012   S/P RT TOTAL KNEE ON 02/03/12--ON 02/08/12--DEVELOPED ACUTE SOB AND CHEST PAIN--AND DIAGNOSED WITH  PULMONARY EMBOLUS AND PNEUMONIA   Restless leg syndrome    Sleep apnea    uses 3 liters O2 at night    Uterine fibroid    NO  PROBLEMS AT PRESENT FROM THE FIBROIDS-STATES SHE IS POST MENOPAUSAL-LAST MENSES 2010 EXCEPT FOR EPISODE THIS YR OF BLEEDING RELATED TO FIBROIDS.   Weakness    BOTH HANDS - S/P BILATERAL CARPAL TUNNEL RELEASE--BUT STILL HAS WEAKNESS--OFTEN DROPS THINGS    Family History  Problem Relation Age of Onset   Breast cancer Mother        stage IV at diagnosis   Emphysema Mother        smoked   Heart disease Mother    COPD Father        smoked   Asthma Father    Heart disease Father    Cancer Brother        Sinus    Past Surgical History:  Procedure Laterality Date   BREAST BIOPSY Right 06/04/2018   BREAST LUMPECTOMY Right 06/2018   BREAST LUMPECTOMY WITH RADIOACTIVE SEED AND SENTINEL LYMPH NODE BIOPSY Right 07/19/2018   Procedure: BREAST LUMPECTOMY WITH RADIOACTIVE SEED AND SENTINEL LYMPH NODE BIOPSY;  Surgeon: Alphonsa Overall, MD;  Location: [facility];  Service: General;  Laterality: Right;   CARDIAC CATHETERIZATION     CARDIAC VALVE REPLACEMENT     2017   CARPAL TUNNEL RELEASE     Bilateral   CHOLECYSTECTOMY     CORONARY ANGIOPLASTY     2010 has stent in place   CYSTOSCOPY W/ RETROGRADES Right 09/21/2013   Procedure: CYSTOSCOPY WITH RIGHT RETROGRADE PYELOGRAM RIGHT DOUBLE J STENT ;  Surgeon: Fredricka Bonine, MD;  Location: [facility];  Service: Urology;  Laterality: Right;   CYSTOSCOPY WITH URETEROSCOPY AND STENT PLACEMENT Right 10/25/2013   Procedure: CYSTOSCOPY RIGHT URETEROSCOPY HOLMIUM LASER LITHO AND STENT PLACEMENT;  Surgeon: Fredricka Bonine, MD;  Location: [facility];  Service:  Urology;  Laterality: Right;   HERNIA REPAIR     INTRAOPERATIVE TRANSESOPHAGEAL ECHOCARDIOGRAM N/A 12/12/2014   Procedure: INTRAOPERATIVE TRANSESOPHAGEAL ECHOCARDIOGRAM;  Surgeon: Burnell Blanks, MD;  Location: [facility];  Service: Cardiovascular;  Laterality: N/A;   JOINT REPLACEMENT     bil total knees   KNEE ARTHROPLASTY  02/03/2012   Procedure: COMPUTER ASSISTED TOTAL KNEE ARTHROPLASTY;  Surgeon: Mcarthur Rossetti, MD;  Location: [facility];  Service: Orthopedics;  Laterality: Right;  Right total knee arthroplasty   LEFT AND RIGHT HEART CATHETERIZATION WITH CORONARY ANGIOGRAM N/A 03/17/2013   Procedure: LEFT AND RIGHT HEART CATHETERIZATION WITH CORONARY ANGIOGRAM;  Surgeon: Burnell Blanks, MD;  Location: [facility] C[facility] LAB;  Service: Cardiovascular;  Laterality: N/A;   LEFT AND RIGHT HEART CATHETERIZATION WITH CORONARY/GRAFT ANGIOGRAM N/A 09/14/2014   Procedure: LEFT AND RIGHT HEART CATHETERIZATION WITH Beatrix Fetters;  Surgeon: Burnell Blanks, MD;  Location: [facility] CATH LAB;  Service: Cardiovascular;  Laterality: N/A;   TEE WITHOUT CARDIOVERSION N/A 03/14/2013   Procedure: TRANSESOPHAGEAL ECHOCARDIOGRAM (TEE);  Surgeon: Lelon Perla, MD;  Location: [facility] ENDOSCOPY;  Service: Cardiovascular;  Laterality: N/A;   TEE WITHOUT CARDIOVERSION N/A 11/14/2014   Procedure: TRANSESOPHAGEAL ECHOCARDIOGRAM (TEE);  Surgeon: Lelon Perla, MD;  Location[facility] ENDOSCOPY;  Service: Cardiovascular;  Laterality: N/A;   TONSILLECTOMY     maybe as a child- does not know   TOTAL KNEE ARTHROPLASTY  09/10/2012   Procedure: TOTAL KNEE ARTHROPLASTY;  Surgeon: Mcarthur Rossetti, MD;  Location[facility];  Service: Orthopedics;  Laterality: Left;   TOTAL KNEE REVISION Right 07/15/2013   Procedure: REVISION ARTHROPLASTY RIGHT KNEE;  Surgeon: Mcarthur Rossetti, MD;  Location[facility];  Service: Orthopedics;  Laterality: Right;   TRANSCATHETER AORTIC  VALVE REPLACEMENT, TRANSFEMORAL N/A  12/12/2014   Procedure: TRANSCATHETER AORTIC VALVE REPLACEMENT, TRANSFEMORAL;  Surgeon: Burnell Blanks, MD;  Location: [facility];  Service: Cardiovascular;  Laterality: N/A;   TRIGGER FINGER RELEASE  09/10/2012   Procedure: RELEASE TRIGGER FINGER/A-1 PULLEY;  Surgeon: Mcarthur Rossetti, MD;  Location[facility];  Service: Orthopedics;  Laterality: Right;  Right Ring Finger   TUBAL LIGATION     Social History   Occupational History   Occupation: Disabled  Tobacco Use   Smoking status: Former    Packs/day: 1.50    Years: 30.00    Pack years: 45.00    Types: Cigarettes    Quit date: 12/29/2000    Years since quitting: 21.0   Smokeless tobacco: Never  Vaping Use   Vaping Use: Never used  Substance and Sexual Activity   Alcohol use: Not Currently   Drug use: Not Currently   Sexual activity: Not Currently    Birth control/protection: Surgical

## 2022-01-13 NOTE — Patient Instructions (Signed)
Plan: Avoid overhead lifting and overhead use of the arms. Do not lift greater than 5 lbs. Adjust head rest in vehicle to prevent hyperextension if rear ended. Take extra precautions to avoid falling. Referral to PT for cervical traction and McKenzie exercises.Marland Kitchen

## 2022-01-19 ENCOUNTER — Other Ambulatory Visit: Payer: Self-pay | Admitting: Student

## 2022-01-19 DIAGNOSIS — I2510 Atherosclerotic heart disease of native coronary artery without angina pectoris: Secondary | ICD-10-CM

## 2022-01-19 DIAGNOSIS — I350 Nonrheumatic aortic (valve) stenosis: Secondary | ICD-10-CM

## 2022-01-27 NOTE — Therapy (Incomplete)
OUTPATIENT PHYSICAL THERAPY CERVICAL EVALUATION   Patient Name: FALANA CLAGG MRN: [id number] DOB:08-Nov-1961, 61 y.o., female Today's Date: 01/27/2022    Past Medical History:  Diagnosis Date   Anemia    Aortic valve stenosis, severe    Arthritis    PAIN AND SEVERE OA LEFT KNEE ; S/P RIGHT TKA ON 02/03/12; HAS LOWER BACK PAIN-UNABLE TO STAND MORE THAN 10 MIN; ARTHRITIS "ALL OVER"   Asthma    Blood transfusion    2013[facility]   Breast cancer in female [facility])    Right   CAD (coronary artery disease)    Cath 2010 with DES x 1 RCA-- PT'S CARDIOLOGIST IS DR. MCALHANY   Chronic diastolic congestive heart failure (HCC)    COPD (chronic obstructive pulmonary disease) ([place])    Depression    Diabetes mellitus DIAGNOSED IN2010   Controll s with diet   Dyspnea    with much ambulation   Eczema    on back   Headache    migraines younger- rare now 02/07/21   Heart murmur    History of hiatal hernia    History of kidney stones    passed or blasted   Hyperlipidemia    Hypertension    Morbid obesity with body mass index of 60.0-69.9 in adult [facility])    Myocardial infarction ([place])    PT THINKS SHE WAS DX WITH MI AT THE TIME OF HEART STENTING   Neuromuscular disorder ([place])    bilateral hands   Personal history of radiation therapy    Pneumonia    Pulmonary embolism ([place]) 02/08/2012   S/P RT TOTAL KNEE ON 02/03/12--ON 02/08/12--DEVELOPED ACUTE SOB AND CHEST PAIN--AND DIAGNOSED WITH  PULMONARY EMBOLUS AND PNEUMONIA   Restless leg syndrome    Sleep apnea    uses 3 liters O2 at night    Uterine fibroid    NO PROBLEMS AT PRESENT FROM THE FIBROIDS-STATES SHE IS POST MENOPAUSAL-LAST MENSES 2010 EXCEPT FOR EPISODE THIS YR OF BLEEDING RELATED TO FIBROIDS.   Weakness    BOTH HANDS - S/P BILATERAL CARPAL TUNNEL RELEASE--BUT STILL HAS WEAKNESS--OFTEN DROPS THINGS   Past Surgical History:  Procedure Laterality Date   BREAST BIOPSY Right 06/04/2018   BREAST LUMPECTOMY Right 06/2018   BREAST  LUMPECTOMY WITH RADIOACTIVE SEED AND SENTINEL LYMPH NODE BIOPSY Right 07/19/2018   Procedure: BREAST LUMPECTOMY WITH RADIOACTIVE SEED AND SENTINEL LYMPH NODE BIOPSY;  Surgeon: Alphonsa Overall, MD;  Location: [place];  Service: General;  Laterality: Right;   CARDIAC CATHETERIZATION     CARDIAC VALVE REPLACEMENT     2017   CARPAL TUNNEL RELEASE     Bilateral   CHOLECYSTECTOMY     CORONARY ANGIOPLASTY     2010 has stent in place   CYSTOSCOPY W/ RETROGRADES Right 09/21/2013   Procedure: CYSTOSCOPY WITH RIGHT RETROGRADE PYELOGRAM RIGHT DOUBLE J STENT ;  Surgeon: Fredricka Bonine, MD;  [facility];  Service: Urology;  Laterality: Right;   CYSTOSCOPY WITH URETEROSCOPY AND STENT PLACEMENT Right 10/25/2013   Procedure: CYSTOSCOPY RIGHT URETEROSCOPY HOLMIUM LASER LITHO AND STENT PLACEMENT;  Surgeon: Fredricka Bonine, MD;  [facility];  Service: Urology;  Laterality: Right;   HERNIA REPAIR     INTRAOPERATIVE TRANSESOPHAGEAL ECHOCARDIOGRAM N/A 12/12/2014   Procedure: INTRAOPERATIVE TRANSESOPHAGEAL ECHOCARDIOGRAM;  Surgeon: Burnell Blanks, MD;  Location: [place];  Service: Cardiovascular;  Laterality: N/A;   JOINT REPLACEMENT     bil total knees   KNEE ARTHROPLASTY  02/03/2012  Procedure: COMPUTER ASSISTED TOTAL KNEE ARTHROPLASTY;  Surgeon: Mcarthur Rossetti, MD;  Location: [place];  Service: Orthopedics;  Laterality: Right;  Right total knee arthroplasty   LEFT AND RIGHT HEART CATHETERIZATION WITH CORONARY ANGIOGRAM N/A 03/17/2013   Procedure: LEFT AND RIGHT HEART CATHETERIZATION WITH CORONARY ANGIOGRAM;  Surgeon: Burnell Blanks, MD;  Location: [facility] CATH LAB;  Service: Cardiovascular;  Laterality: N/A;   LEFT AND RIGHT HEART CATHETERIZATION WITH CORONARY/GRAFT ANGIOGRAM N/A 09/14/2014   Procedure: LEFT AND RIGHT HEART CATHETERIZATION WITH Beatrix Fetters;  Surgeon: Burnell Blanks, MD;  Location: [facility] CATH LAB;  Service: Cardiovascular;  Laterality: N/A;    TEE WITHOUT CARDIOVERSION N/A 03/14/2013   Procedure: TRANSESOPHAGEAL ECHOCARDIOGRAM (TEE);  Surgeon: Lelon Perla, MD;  Location: [facility] ENDOSCOPY;  Service: Cardiovascular;  Laterality: N/A;   TEE WITHOUT CARDIOVERSION N/A 11/14/2014   Procedure: TRANSESOPHAGEAL ECHOCARDIOGRAM (TEE);  Surgeon: Lelon Perla, MD;  Location: [facility] ENDOSCOPY;  Service: Cardiovascular;  Laterality: N/A;   TONSILLECTOMY     maybe as a child- does not know   TOTAL KNEE ARTHROPLASTY  09/10/2012   Procedure: TOTAL KNEE ARTHROPLASTY;  Surgeon: Mcarthur Rossetti, MD;  [facility];  Service: Orthopedics;  Laterality: Left;   TOTAL KNEE REVISION Right 07/15/2013   Procedure: REVISION ARTHROPLASTY RIGHT KNEE;  Surgeon: Mcarthur Rossetti, MD;  [facility];  Service: Orthopedics;  Laterality: Right;   TRANSCATHETER AORTIC VALVE REPLACEMENT, TRANSFEMORAL N/A 12/12/2014   Procedure: TRANSCATHETER AORTIC VALVE REPLACEMENT, TRANSFEMORAL;  Surgeon: Burnell Blanks, MD;  Location: [place];  Service: Cardiovascular;  Laterality: N/A;   TRIGGER FINGER RELEASE  09/10/2012   Procedure: RELEASE TRIGGER FINGER/A-1 PULLEY;  Surgeon: Mcarthur Rossetti, MD;  [facility];  Service: Orthopedics;  Laterality: Right;  Right Ring Finger   TUBAL LIGATION     Patient Active Problem List   Diagnosis Date Noted   Healthcare maintenance 11/17/2021   Stress reaction 11/17/2021   Spondylolisthesis, lumbar region 02/11/2021    Class: Acute   Malignant neoplasm of upper-outer quadrant of right breast in female, estrogen receptor positive ([place]) 06/10/2018   Restless leg syndrome 02/04/2018   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   Depression 08/06/2015   Hypertension associated with diabetes ([place])    Aortic stenosis    Chronic diastolic congestive heart failure (HCC)    COPD (chronic obstructive pulmonary disease) ([place]) 09/21/2013   Obstructive sleep apnea 09/21/2013   Nocturnal hypoxemia  09/12/2013   Hyperlipidemia 11/30/2012   History of pulmonary embolus ([place]) 07/09/2012   Normocytic anemia 02/08/2012   Type 2 diabetes mellitus without complication, without long-term current use of insulin ([place]) 02/08/2012   Asthma 02/08/2012   GERD (gastroesophageal reflux disease)    CAD (coronary artery disease) 12/08/2011    PCP: [name], MD  REFERRING PROVIDER: [name], MD  REFERRING DIAG: M54.2 (ICD-10-CM) - Cervicalgia R29.898 (ICD-10-CM) - Weakness of shoulder R20.0,R20.2 (ICD-10-CM) - Numbness and tingling in left arm   THERAPY DIAG:  No diagnosis found.  ONSET DATE: ***  SUBJECTIVE:  SUBJECTIVE STATEMENT: ***  PERTINENT HISTORY:  ***  PAIN:  Are you having pain? {yes/no:20286} NPRS scale: ***/10 Pain location: *** Pain orientation: {Pain Orientation:25161}  PAIN TYPE: {type:313116} Pain description: {PAIN DESCRIPTION:21022940}  Aggravating factors: *** Relieving factors: ***  PRECAUTIONS: {Therapy precautions:24002}  WEIGHT BEARING RESTRICTIONS {Yes ***/No:24003}  FALLS:  Has patient fallen in last 6 months? {yes/no:20286} Number of falls: ***  LIVING ENVIRONMENT: Lives with: {[facility] lives with:25569::"lives with their family"} Lives in: {Lives in:25570} Stairs: {yes/no:20286}; {Stairs:24000} Has following equipment at home: {Assistive devices:23999}  OCCUPATION: ***  PLOF: {PLOF:24004}  PATIENT GOALS ***  OBJECTIVE:   DIAGNOSTIC FINDINGS:  MRI: IMPRESSION: Moderate to severe motion degradation. Likely mild-to-moderate canal stenosis C6-C7 and mild canal stenosis at C5-C6. Potentially moderate foraminal stenosis at C6-C7 and mild foraminal stenosis at C5-C6, although confident assessment of the foramina is not possible due to  motion. A CT myelogram or repeat MRI with sedation may be helpful to better characterize, if clinically indicated.  PATIENT SURVEYS:  NDI ***   COGNITION: Overall cognitive status: {cognition:24006}   SENSATION: Light touch: {intact/deficits:24005}  POSTURE:  ***  PALPATION: ***   CERVICAL AROM/PROM  A/PROM A/PROM (deg) 01/27/2022  Flexion   Extension   Right lateral flexion   Left lateral flexion   Right rotation   Left rotation    (Blank rows = not tested)  UE AROM/PROM:  A/PROM Right 01/27/2022 Left 01/27/2022  Shoulder flexion    Shoulder extension    Shoulder abduction    Shoulder adduction    Shoulder extension    Shoulder internal rotation    Shoulder external rotation    Elbow flexion    Elbow extension    Wrist flexion    Wrist extension    Wrist ulnar deviation    Wrist radial deviation    Wrist pronation    Wrist supination     (Blank rows = not tested)  UE MMT:  MMT Right 01/27/2022 Left 01/27/2022  Shoulder flexion    Shoulder extension    Shoulder abduction    Shoulder adduction    Shoulder extension    Shoulder internal rotation    Shoulder external rotation    Middle trapezius    Lower trapezius    Elbow flexion    Elbow extension    Wrist flexion    Wrist extension    Wrist ulnar deviation    Wrist radial deviation    Wrist pronation    Wrist supination    Grip strength     (Blank rows = not tested)  CERVICAL SPECIAL TESTS:    FUNCTIONAL TESTS:  {Functional tests:24029}    TODAY'S TREATMENT:  [facility] Adult PT Treatment:                                                DATE: 01/29/22 Therapeutic Exercise: *** Manual Therapy: *** Neuromuscular re-ed: *** Therapeutic Activity: *** Modalities: *** Self Care: ***    PATIENT EDUCATION:  Education details: see treatment  Person educated: {Person educated:25204} Education method: {Education Method:25205} Education comprehension: {Education  Comprehension:25206}   HOME EXERCISE PROGRAM: ***  ASSESSMENT:  CLINICAL IMPRESSION: Patient is a 61 y.o. female who was seen today for physical therapy evaluation and treatment for ***. Objective impairments include {opptimpairments:25111}. These impairments are limiting patient from {activity limitations:25113}. Personal factors including {Personal factors:25162} are also affecting  patient's functional outcome. Patient will benefit from skilled PT to address above impairments and improve overall function.  REHAB POTENTIAL: {rehabpotential:25112}  CLINICAL DECISION MAKING: {clinical decision making:25114}  EVALUATION COMPLEXITY: {Evaluation complexity:25115}   GOALS: Goals reviewed with patient? {yes/no:20286}  SHORT TERM GOALS:  STG Name Target Date Goal status  1 *** Baseline:  {follow up:25551} {GOALSTATUS:25110}  2 *** Baseline:  {follow up:25551} {GOALSTATUS:25110}  3 *** Baseline: {follow up:25551} {GOALSTATUS:25110}   LONG TERM GOALS:   LTG Name Target Date Goal status  1 *** Baseline: {follow up:25551} {GOALSTATUS:25110}  2 *** Baseline: {follow up:25551} {GOALSTATUS:25110}  3 *** Baseline: {follow up:25551} {GOALSTATUS:25110}  4 *** Baseline: {follow up:25551} {GOALSTATUS:25110}   PLAN: PT FREQUENCY: {rehab frequency:25116}  PT DURATION: {rehab duration:25117}  PLANNED INTERVENTIONS: {rehab planned interventions:25118::"Therapeutic exercises","Therapeutic activity","Neuro Muscular re-education","Balance training","Gait training","Patient/Family education","Joint mobilization"}  PLAN FOR NEXT SESSION: ***  Gwendolyn Grant, PT, DPT, ATC 01/27/22 11:38 AM

## 2022-01-29 ENCOUNTER — Ambulatory Visit: Payer: Medicaid Other | Attending: Specialist

## 2022-01-29 ENCOUNTER — Other Ambulatory Visit: Payer: Self-pay

## 2022-01-29 DIAGNOSIS — Z419 Encounter for procedure for purposes other than remedying health state, unspecified: Secondary | ICD-10-CM | POA: Diagnosis not present

## 2022-01-29 DIAGNOSIS — R29898 Other symptoms and signs involving the musculoskeletal system: Secondary | ICD-10-CM | POA: Insufficient documentation

## 2022-01-29 DIAGNOSIS — M542 Cervicalgia: Secondary | ICD-10-CM | POA: Insufficient documentation

## 2022-01-29 DIAGNOSIS — R202 Paresthesia of skin: Secondary | ICD-10-CM | POA: Insufficient documentation

## 2022-01-29 DIAGNOSIS — R200 Anesthesia of skin: Secondary | ICD-10-CM | POA: Insufficient documentation

## 2022-01-29 NOTE — Therapy (Signed)
[place], [place], [address] Phone: [phone]   Fax:  [phone]  Patient Details  Name: [name] MRN: [id number] Date of Birth: 1961/11/01 Referring Provider:  [name], MD  Encounter Date: 01/29/2022  Patient arrived for PT evaluation for cervicalgia, weakness of the shoulder, and numbness/tingling in the left arm. However patient is scheduled to undergo Lt total shoulder arthroplasty in less than 3 weeks and would like to hold off on evaluation and treatment of her neck and arm pain until after this surgery is completed.    Gwendolyn Grant, PT, DPT, ATC 01/29/22 12:46 PM  [facility] [address] [place], [place], [address] Phone: [phone]   Fax:  [phone]

## 2022-02-03 ENCOUNTER — Other Ambulatory Visit: Payer: Self-pay | Admitting: *Deleted

## 2022-02-03 MED ORDER — EXEMESTANE 25 MG PO TABS: 25.0000 mg | ORAL_TABLET | Freq: Every day | ORAL | 3 refills | Status: DC

## 2022-02-05 ENCOUNTER — Other Ambulatory Visit: Payer: Self-pay | Admitting: *Deleted

## 2022-02-05 ENCOUNTER — Other Ambulatory Visit: Payer: Self-pay | Admitting: Student

## 2022-02-05 MED ORDER — VICTOZA 18 MG/3ML ~~LOC~~ SOPN: PEN_INJECTOR | SUBCUTANEOUS | 3 refills | Status: DC

## 2022-02-05 MED ORDER — VICTOZA 18 MG/3ML ~~LOC~~ SOPN
PEN_INJECTOR | SUBCUTANEOUS | 3 refills | Status: DC
Start: 2022-02-05 — End: 2022-02-05

## 2022-02-05 NOTE — Progress Notes (Signed)
Refilled patient's Victoza as pharmacy did not accept previous provider's DEA number.

## 2022-02-05 NOTE — Telephone Encounter (Signed)
Call from [facility] who stated their computer system will not take Dr Lorelee Cover DEA for Victoza. I will ask another team member to send new rx. Thanks

## 2022-02-06 NOTE — Telephone Encounter (Signed)
Victoza re-ordered as "Print". Rx was faxed to [facility].

## 2022-02-10 ENCOUNTER — Other Ambulatory Visit: Payer: Self-pay

## 2022-02-10 DIAGNOSIS — J441 Chronic obstructive pulmonary disease with (acute) exacerbation: Secondary | ICD-10-CM

## 2022-02-10 MED ORDER — TRELEGY ELLIPTA 100-62.5-25 MCG/ACT IN AEPB: INHALATION_SPRAY | RESPIRATORY_TRACT | 5 refills | Status: DC

## 2022-02-10 NOTE — Telephone Encounter (Signed)
TRELEGY ELLIPTA 100-62.5-25 MCG/ACT AEPB, refill request @ \ [place], [place] would like another doctor to send in this Rx, pt states medicaid will not accept Dr. Collene Gobble license number.

## 2022-02-11 ENCOUNTER — Other Ambulatory Visit: Payer: Self-pay

## 2022-02-13 NOTE — Pre-Procedure Instructions (Signed)
Surgical Instructions    Your procedure is scheduled on Tuesday, February 21st.  Report to [facility] Main Entrance "A" at 10:15 A.M., then check in with the Admitting office.  Call this number if you have problems the morning of surgery:  [phone]   If you have any questions prior to your surgery date call [phone]: Open Monday-Friday 8am-4pm    Remember:  Do not eat after midnight the night before your surgery  You may drink clear liquids until 9:15 a.m. the morning of your surgery.   Clear liquids allowed are: Water, Non-Citrus Juices (without pulp), Carbonated Beverages, Clear Tea, Black Coffee Only (NO MILK, CREAM OR POWDERED CREAMER of any kind), and Gatorade.   Enhanced Recovery after Surgery for Orthopedics Enhanced Recovery after Surgery is a protocol used to improve the stress on your body and your recovery after surgery.  Patient Instructions  The day of surgery (if you have diabetes):  Drink ONE small 10 oz bottle of water by 9:15 am the morning of surgery This bottle was given to you during your hospital  pre-op appointment visit.  Nothing else to drink after completing the  Small 10 oz bottle of water.         If you have questions, please contact your surgeons office.     Take these medicines the morning of surgery:  acetaminophen (TYLENOL) amlodipine-atorvastatin (CADUET) busPIRone (BUSPAR)  DULoxetine (CYMBALTA)  exemestane (AROMASIN) Fluticasone-Umeclidin-Vilant (TRELEGY ELLIPTA) gabapentin (NEURONTIN) metoprolol succinate (TOPROL XL)   As needed: nitroGLYCERIN (NITROSTAT)-let a nurse know if you had to use this. traMADol (ULTRAM)  Follow your surgeon's instructions on when to stop Aspirin and clopidogrel (PLAVIX).  If no instructions were given by your surgeon then you will need to call the office to get those instructions.    As of today, STOP taking any Aspirin (unless otherwise instructed by your surgeon) Aleve, Naproxen, Ibuprofen,  Motrin, Advil, Goody's, BC's, all herbal medications, fish oil, and all vitamins. This includes: meloxicam (MOBIC).   WHAT DO I DO ABOUT MY DIABETES MEDICATION?   The day of surgery, do not take other diabetes injectables, including Victoza (liraglutide).   HOW TO MANAGE YOUR DIABETES BEFORE AND AFTER SURGERY  Why is it important to control my blood sugar before and after surgery? Improving blood sugar levels before and after surgery helps healing and can limit problems. A way of improving blood sugar control is eating a healthy diet by:  Eating less sugar and carbohydrates  Increasing activity/exercise  Talking with your doctor about reaching your blood sugar goals High blood sugars (greater than 180 mg/dL) can raise your risk of infections and slow your recovery, so you will need to focus on controlling your diabetes during the weeks before surgery. Make sure that the doctor who takes care of your diabetes knows about your planned surgery including the date and location.  How do I manage my blood sugar before surgery? Check your blood sugar at least 4 times a day, starting 2 days before surgery, to make sure that the level is not too high or low.  Check your blood sugar the morning of your surgery when you wake up and every 2 hours until you get to the Short Stay unit.  If your blood sugar is less than 70 mg/dL, you will need to treat for low blood sugar: Do not take insulin. Treat a low blood sugar (less than 70 mg/dL) with  cup of clear juice (cranberry or apple), 4 glucose tablets, OR glucose  gel. Recheck blood sugar in 15 minutes after treatment (to make sure it is greater than 70 mg/dL). If your blood sugar is not greater than 70 mg/dL on recheck, call [phone] for further instructions. Report your blood sugar to the short stay nurse when you get to Short Stay.  If you are admitted to the hospital after surgery: Your blood sugar will be checked by the staff and you will  probably be given insulin after surgery (instead of oral diabetes medicines) to make sure you have good blood sugar levels. The goal for blood sugar control after surgery is 80-180 mg/dL.                      Do NOT Smoke (Tobacco/Vaping) for 24 hours prior to your procedure.  If you use a CPAP at night, you may bring your mask/headgear for your overnight stay.   Contacts, glasses, piercing's, hearing aid's, dentures or partials may not be worn into surgery, please bring cases for these belongings.    For patients admitted to the hospital, discharge time will be determined by your treatment team.   Patients discharged the day of surgery will not be allowed to drive home, and someone needs to stay with them for 24 hours.  NO VISITORS WILL BE ALLOWED IN PRE-OP WHERE PATIENTS ARE PREPPED FOR SURGERY.  ONLY 1 SUPPORT PERSON MAY BE PRESENT IN THE WAITING ROOM WHILE YOU ARE IN SURGERY.  IF YOU ARE TO BE ADMITTED, ONCE YOU ARE IN YOUR ROOM YOU WILL BE ALLOWED TWO (2) VISITORS. (1) VISITOR MAY STAY OVERNIGHT BUT MUST ARRIVE TO THE ROOM BY 8pm.  Minor children may have two parents present. Special consideration for safety and communication needs will be reviewed on a case by case basis.   Special instructions:                                    [facility]- Preparing for Total Shoulder Arthroplasty   Before surgery, you can play an important role. Because skin is not sterile, your skin needs to be as free of germs as possible. You can reduce the number of germs on your skin by using the following products. Benzoyl Peroxide Gel Reduces the number of germs present on the skin Applied twice a day to shoulder area starting two days before surgery   Chlorhexidine Gluconate (CHG) Soap An antiseptic cleaner that kills germs and bonds with the skin to continue killing germs even after washing Used for showering the night before surgery and morning of surgery   Oral Hygiene is also important to reduce  your risk of infection.                                    Remember - BRUSH YOUR TEETH THE MORNING OF SURGERY WITH YOUR REGULAR TOOTHPASTE  ==================================================================  Please follow these instructions carefully:  BENZOYL PEROXIDE 5% GEL  Please do not use if you have an allergy to benzoyl peroxide.   If your skin becomes reddened/irritated stop using the benzoyl peroxide.  Starting two days before surgery, apply as follows: Apply benzoyl peroxide in the morning and at night. Apply after taking a shower. If you are not taking a shower clean entire shoulder front, back, and side along with the armpit with a clean wet washcloth.  Place  a quarter-sized dollop on your shoulder and rub in thoroughly, making sure to cover the front, back, and side of your shoulder, along with the armpit.   2 days before ____ AM   ____ PM              1 day before ____ AM   ____ PM                             Do this twice a day for two days.  (Last application is the night before surgery, AFTER using the CHG soap as described below).  Do NOT apply benzoyl peroxide gel on the day of surgery.  CHLORHEXIDINE GLUCONATE (CHG) SOAP  Please do not use if you have an allergy to CHG or antibacterial soaps. If your skin becomes reddened/irritated stop using the CHG.   Do not shave (including legs and underarms) for at least 48 hours prior to first CHG shower. It is OK to shave your face.  Starting the night before surgery, use CHG soap as follows:  Shower the NIGHT BEFORE SURGERY and MORNING OF SURGERY with CHG.  If you choose to wash your hair, wash your hair first as usual with your normal shampoo.  After shampooing, rinse your hair and body thoroughly to remove the shampoo.  Use CHG as you would any other liquid soap.  You can apply CHG directly to the skin and wash gently with a scrungie or a clean washcloth.  Apply the CHG soap to your body ONLY FROM THE NECK DOWN.   Do not use on open wounds or open sores.  Avoid contact with your eyes, ears, mouth, and genitals (private parts).  Wash face and genitals (private parts) with your normal soap.  Wash thoroughly, paying special attention to the area where your surgery will be performed.  Thoroughly rinse your body with warm water from the neck down.  DO NOT shower/wash with your normal soap after using and rinsing off the CHG soap.   Pat yourself dry with a CLEAN TOWEL.    Apply benzoyl peroxide.   Wear CLEAN PAJAMAS to bed the night before surgery; wear comfortable clothes the morning of surgery.  Place CLEAN SHEETS on your bed the night of your first shower and DO NOT SLEEP WITH PETS.    Day of Surgery: Shower with CHG soap. Do not wear jewelry, make up, nail polish, gel polish, artificial nails, or any other type of covering on natural nails including finger and toenails. If patients have artificial nails, gel coating, etc. that need to be removed by a nail salon please have this removed prior to surgery. Surgery may need to be canceled/delayed if the surgeon/anesthesiologist feels like the patient is unable to be adequately monitored. Do not wear lotions, powders, perfumes, or deodorant. Do not shave 48 hours prior to surgery.   Do not bring valuables to the hospital. [facility] is not responsible for any belongings or valuables. Wear Clean/Comfortable clothing the morning of surgery Remember to brush your teeth WITH YOUR REGULAR TOOTHPASTE.   Please read over the following fact sheets that you were given.   3 days prior to your procedure or After your COVID test   You are not required to quarantine however you are required to wear a well-fitting mask when you are out and around people not in your household. If your mask becomes wet or soiled, replace with a new one.  Wash your hands often with soap and water for 20 seconds or clean your hands with an alcohol-based hand sanitizer that  contains at least 60% alcohol.   Do not share personal items.   Notify your provider:  o if you are in close contact with someone who has COVID  o or if you develop a fever of 100.4 or greater, sneezing, cough, sore throat, shortness of breath or body aches.

## 2022-02-14 ENCOUNTER — Other Ambulatory Visit: Payer: Self-pay

## 2022-02-14 ENCOUNTER — Encounter (HOSPITAL_COMMUNITY): Payer: Self-pay

## 2022-02-14 ENCOUNTER — Encounter (HOSPITAL_COMMUNITY)
Admission: RE | Admit: 2022-02-14 | Discharge: 2022-02-14 | Disposition: A | Discharge status: Home / Self Care | Payer: Medicaid Other | Source: Ambulatory Visit | Attending: Orthopedic Surgery | Admitting: Orthopedic Surgery

## 2022-02-14 VITALS — BP 133/63 | HR 96 | Temp 99.2°F | Resp 20 | Ht 63.0 in | Wt 318.1 lb

## 2022-02-14 DIAGNOSIS — Z1389 Encounter for screening for other disorder: Secondary | ICD-10-CM | POA: Insufficient documentation

## 2022-02-14 DIAGNOSIS — Z01818 Encounter for other preprocedural examination: Secondary | ICD-10-CM | POA: Diagnosis not present

## 2022-02-14 DIAGNOSIS — E119 Type 2 diabetes mellitus without complications: Secondary | ICD-10-CM | POA: Insufficient documentation

## 2022-02-14 DIAGNOSIS — Z20822 Contact with and (suspected) exposure to covid-19: Secondary | ICD-10-CM | POA: Insufficient documentation

## 2022-02-14 LAB — HEMOGLOBIN A1C
Hgb A1c MFr Bld: 8 % — ABNORMAL HIGH (ref 4.8–5.6)
Mean Plasma Glucose: 182.9 mg/dL

## 2022-02-14 LAB — CBC
HCT: 43.8 % (ref 36.0–46.0)
Hemoglobin: 14.3 g/dL (ref 12.0–15.0)
MCH: 29.2 pg (ref 26.0–34.0)
MCHC: 32.6 g/dL (ref 30.0–36.0)
MCV: 89.6 fL (ref 80.0–100.0)
Platelets: 238 10*3/uL (ref 150–400)
RBC: 4.89 MIL/uL (ref 3.87–5.11)
RDW: 13 % (ref 11.5–15.5)
WBC: 8.2 10*3/uL (ref 4.0–10.5)
nRBC: 0 % (ref 0.0–0.2)

## 2022-02-14 LAB — BASIC METABOLIC PANEL
Anion gap: 11 (ref 5–15)
BUN: 18 mg/dL (ref 8–23)
CO2: 30 mmol/L (ref 22–32)
Calcium: 9.4 mg/dL (ref 8.9–10.3)
Chloride: 97 mmol/L — ABNORMAL LOW (ref 98–111)
Creatinine, Ser: 0.93 mg/dL (ref 0.44–1.00)
GFR, Estimated: >60 mL/min (ref >60)
Glucose, Bld: 244 mg/dL — ABNORMAL HIGH (ref 70–99)
Potassium: 3.8 mmol/L (ref 3.5–5.1)
Sodium: 138 mmol/L (ref 135–145)

## 2022-02-14 LAB — URINALYSIS, ROUTINE W REFLEX MICROSCOPIC
Bilirubin Urine: NEGATIVE
Glucose, UA: NEGATIVE mg/dL
Hgb urine dipstick: NEGATIVE
Ketones, ur: NEGATIVE mg/dL
Nitrite: NEGATIVE
Protein, ur: NEGATIVE mg/dL
Specific Gravity, Urine: 1.021 (ref 1.005–1.030)
WBC, UA: >50 WBC/hpf — ABNORMAL HIGH (ref 0–5)
pH: 5 (ref 5.0–8.0)

## 2022-02-14 LAB — SARS CORONAVIRUS 2 (TAT 6-24 HRS): SARS Coronavirus 2: NEGATIVE

## 2022-02-14 LAB — SURGICAL PCR SCREEN
MRSA, PCR: NEGATIVE
Staphylococcus aureus: NEGATIVE

## 2022-02-14 LAB — GLUCOSE, CAPILLARY: Glucose-Capillary: 237 mg/dL — ABNORMAL HIGH (ref 70–99)

## 2022-02-14 NOTE — Progress Notes (Signed)
PCP - Dr. [name] Cardiologist - Dr. Angelena Form  PPM/ICD - n/a  Chest x-ray - n/a EKG - 02/14/22 Stress Test - 2014 ECHO - 01/23/21 Cardiac Cath - 2014  Sleep Study - OSA+ CPAP - denies use  Fasting Blood Sugar - 130-185 Checks 1-2 times a month. Pt states she does not check CBG like she is supposed to which is supposed to be done daily.  Will collect A1C today.  Blood Thinner Instructions: Plavix, LD 2/15 Aspirin Instructions: LD 2/16  ERAS Protcol -Clear liquids until 0915 PRE-SURGERY Ensure or G2- G2  COVID TEST- 02/14/22, done in PAT  Anesthesia review: Yes  Patient denies shortness of breath, fever, cough and chest pain at PAT appointment   All instructions explained to the patient, with a verbal understanding of the material. Patient agrees to go over the instructions while at home for a better understanding. Patient also instructed to self quarantine after being tested for COVID-19. The opportunity to ask questions was provided.

## 2022-02-14 NOTE — Progress Notes (Signed)
Notified Debbie, Dr. Randel Pigg scheduler about pt's abnormal UA. PA will be made aware.  Jacqlyn Larsen, RN

## 2022-02-16 ENCOUNTER — Other Ambulatory Visit: Payer: Self-pay | Admitting: Surgical

## 2022-02-16 LAB — URINE CULTURE: Culture: >=100000 — AB

## 2022-02-16 MED ORDER — NITROFURANTOIN MONOHYD MACRO 100 MG PO CAPS: 100.0000 mg | ORAL_CAPSULE | Freq: Two times a day (BID) | ORAL | 0 refills | Status: AC

## 2022-02-17 ENCOUNTER — Telehealth: Payer: Self-pay | Admitting: Cardiovascular Disease

## 2022-02-17 NOTE — Telephone Encounter (Signed)
° °  Pre-operative Risk Assessment    Patient Name: [name] KEEVEN  DOB: [birth year] MRN: [id number]      Request for Surgical Clearance    Procedure:   Left Shoulder Replacement  Date of Surgery:  Clearance 02/18/22                                 Surgeon:  Dr. Dr. [name]  Surgeon's Group or Practice Name:  [facility] Phone number:  [phone] Fax number:  [phone]   Type of Clearance Requested:   - Medical    Type of Anesthesia:  General    Additional requests/questions:   Jackelyn Poling is going to lunch today from 11:45-12:45 so she will be unable to answer during that time.   Crist Infante   02/17/2022, 11:35 AM

## 2022-02-17 NOTE — Telephone Encounter (Signed)
Debbie calling back. She states the patient's surgery has been rescheduled to 3/9.

## 2022-02-17 NOTE — Progress Notes (Addendum)
Anesthesia Chart Review:  Case: [id number] Date/Time: 03/06/22 1427   Procedure: LEFT SHOULDER REPLACEMENT (Left: Shoulder)   Anesthesia type: General   Pre-op diagnosis: left shouder osteoarthritis   Location: [facility] OR ROOM 07 / [place] [facility]   Surgeons: [name], MD       DISCUSSION: Patient is a 61 year old female scheduled for the above procedure.  History includes former smoker, asthma, COPD (uses 2L O2 at night, no CPAP), HTN, HLD, aortic stenosis (s/p TAVR, 79m 12/12/14), mitral stenosis (mild 01/23/21 echo), 1VCAD (DES dRCA 02/08/09; ~ 70% ostial RCA with patent dRCA stent 09/14/14, medically managed), OSA (uses 2L O2 at night, not CPAP), GERD, breast cancer (right lumpectomy/LN sampling 07/19/18 "for pT1c pN1 stage IB invasive ductal carcinoma, grade 1, with negative margins", s/p radiation completed 10/18/18 & started anastrozole), spinal surgery (L4-5 left posterior interbody fusion 02/11/21), repair incarcerated ventral hernia (12/28/01), osteoarthritis (right TKA 26/8/12 complicated by post-op PE [treated warfarin x 6 months] and HCAP 02/08/12  with revision 07/15/13; left TKA 09/10/12). BMI is consistent with morbid obesity.    She saw orthopedic surgeon NBasil Dess MD on 01/13/22 for cervicalgia, N/T in left arm. 07/2021 MRI c-spine showed likely mild-moderate canal stenosis at C6-7 , but imaging moderately-severely motion degraded. Consider CT myelogram or repeat MRI with sedation if clinically indicated.  Prior history of bilateral CTR.  Dr. DMarlou Sais addressing left shoulder. Patient advised to avoid overhead lifting and overhead use of the arms, no lift > 5 lbs, adjust head rest in vehicle to prevent hyperextension if rear ended, take extra precautions to avoid falling, and was referred to PT for cervical traction and McKenzie exercises.  Last visit with PCP Dr. BCollene Gobbleon 11/17/21. Patient was euvolemic on exam and compliant with HF medications. Denied dyspnea, orthopnea, chest pain or  increased leg swelling. Continue GDMT for chronic diastolic CHF and CAD. Continue for amlodipine-atorvastatin and Repatha for HLD. A1c 7.5%, continue Victoza, and consider switching to tirzepatide if worsening.  Referred for colonoscopy.  Patient deferred PAP.  Last visit with cardiologist Dr. MAngelena Formwas on 04/03/21. S/p TAVR 2015 with valve working well by 12/2020 echo. No chest pain. On ASA/Plavix for CAD and AVR. Needs SBE prophylaxis. Weight loss advised. 12 month follow-up recommended. She had previously been given cardiac clearance for 01/2021 back surgery. No felt to be a good candidate for stress test given her size, but given no active chest pain was cleared without further testing and given permission to hold DAPT for that procedure. Cardiology had also advised to keep history of post-op PE in mind.  Dr. DMarlou Sais requesting preoperative cardiology input. Patient reported last Plavix reported as 02/12/22 and last ASA as 02/13/22.  Urine culture 02/14/22 grew E.coli. This was communicated with Debbie at Dr. DRandel Piggoffice, as well as A1c 8.0%, up from 7.5% 11/14/21.   UPDATE 02/17/22 4:22 PM: Surgery is being rescheduled for 03/06/22, and she as a cardiology visit on 02/19/22 with LErmalinda Barrios PA-C.  UPDATE 02/19/22 10:07 AM: Patient had cardiology evaluation by LErmalinda Barrios PA-C this morning. She wrote, "Preop clearance for Left shoulder replacement 03/06/22 by Dr. DMarlou Sa Patient without angina, METS over 4 so she can proceed with surgery without any further cardiac testing. Dr. MAngelena Formconfirmed she can hold Plavix and ASA if needed 5 days prior to surgery. No need for SBE prophylaxis.   According to the Revised Cardiac Risk Index (RCRI), her Perioperative Risk of Major Cardiac Event is (%): 0.9   Her Functional  Capacity in METs is: 4.3 according to the [facility] Activity Status Index (DASI).     Severe aortic stenosis status post TAVR 12/12/2014 valve working well by echo 12/2020 continue aspirin and  Plavix given CAD and AVR.  SBE prophylaxis prior to select procedures including dental visits   CAD without angina cardiac cath 2015 moderate 70% ostial RCA stenosis patent RCA stent no disease in LAD or circumflex-no angina..."   VS: BP 133/63    Pulse 96    Temp 37.3 C (Oral)    Resp 20    Ht '5\' 3"'  (1.6 m)    Wt (!) 144.3 kg    LMP 02/03/2012    SpO2 99%    BMI 56.35 kg/m    PROVIDERS: [name], MD is PCP Lauree Chandler, MD is cardiologist. Last visit 04/03/21.  Magrinat, Sarajane Jews, MD is HEM-ONC   LABS: Preoperative labs noted. See DISCUSSION. (all labs ordered are listed, but only abnormal results are displayed)  Labs Reviewed  URINE CULTURE - Abnormal; Notable for the following components:      Result Value   Culture >=100,000 COLONIES/mL ESCHERICHIA COLI (*)    Organism ID, Bacteria ESCHERICHIA COLI (*)    All other components within normal limits  GLUCOSE, CAPILLARY - Abnormal; Notable for the following components:   Glucose-Capillary 237 (*)    All other components within normal limits  BASIC METABOLIC PANEL - Abnormal; Notable for the following components:   Chloride 97 (*)    Glucose, Bld 244 (*)    All other components within normal limits  URINALYSIS, ROUTINE W REFLEX MICROSCOPIC - Abnormal; Notable for the following components:   APPearance HAZY (*)    Leukocytes,Ua LARGE (*)    WBC, UA >50 (*)    Bacteria, UA FEW (*)    All other components within normal limits  HEMOGLOBIN A1C - Abnormal; Notable for the following components:   Hgb A1c MFr Bld 8.0 (*)    All other components within normal limits  SURGICAL PCR SCREEN  SARS CORONAVIRUS 2 (TAT 6-24 HRS)  CBC    Nocturnal Polysomnogram 08/05/13: IMPRESSION/RECOMMENDATIONS: 1. Minimal obstructive sleep apnea/hypopnea syndrome, with an AHI of 5 events per hour and oxygen desaturation as low as 85%.  It is unclear how much this may be impacting her sleep and quality of life, but does not represent a  significant increase in cardiovascular risk.  Treatment for this can include a trial of weight loss alone, upper airway surgery, dental appliance, also CPAP. Clinical correlation is suggested. 2. Mild oxygen desaturation as low as 85% associated with her events and also independent of her events.  Oxygen at 1 L/minute was added at 1:00 a.m. per [facility] protocol. 3. The patient was noted to have large numbers of leg jerks, primarily at the beginning of the study, however, they were not periodic in nature and resulted in very little sleep disruption.  I suspect this is not related to a primary movement disorder of sleep.     IMAGES: CT Left shoulder 10/09/21: IMPRESSION: 1. Advanced osteoarthritis of the left glenohumeral joint with small joint effusion and intra-articular loose bodies. 2. No gross rotator cuff muscular atrophy or impingement. 3. Coronary and aortic Atherosclerosis (ICD10-I70.0). Possible aortic valvular calcifications.   MRI C-spine 08/25/21: IMPRESSION: Moderate to severe motion degradation. Likely mild-to-moderate canal stenosis C6-C7 and mild canal stenosis at C5-C6. Potentially moderate foraminal stenosis at C6-C7 and mild foraminal stenosis at C5-C6, although confident assessment of the foramina is not  possible due to motion. A CT myelogram or repeat MRI with sedation may be helpful to better characterize, if clinically indicated.     EKG: 02/14/22: Normal sinus rhythm Low voltage QRS Cannot rule out Anterior infarct , age undetermined Abnormal ECG When compared with ECG of 28-Nov-2020 14:09, PREVIOUS ECG IS PRESENT Confirmed by Loralie Champagne [phone]) on 02/15/2022 11:32:52 PM   CV: Echo 01/23/21: IMPRESSIONS   1. Left ventricular ejection fraction, by estimation, is 60 to 65%. The  left ventricle has normal function. The left ventricle has no regional  wall motion abnormalities. There is mild concentric left ventricular  hypertrophy. Left ventricular  diastolic  parameters are indeterminate.   2. Right ventricular systolic function is normal. The right ventricular  size is normal.   3. The mitral valve is abnormal. No evidence of mitral valve  regurgitation. Mild mitral stenosis. The mean mitral valve gradient is 4.0  mmHg with average heart rate of 77 bpm.   4. The aortic valve has been repaired/replaced. Aortic valve  regurgitation is not visualized. 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 Procedure Date: 12/12/2014. Aortic  valve mean gradient measures 15.0 mmHg.   5. The inferior vena cava is normal in size with greater than 50%  respiratory variability, suggesting right atrial pressure of 3 mmHg.  - Comparison(s): A prior study was performed on 01/17/2020. Prior images  reviewed side by side. Compared to prior stable prosthetic valve  gradients; mitral valve mean gradient has decreased at lower heart rate.    RHC/LHC 09/14/14: - LM: No obstructive disease. - LAD: Moderate caliber vessel that courses to the apex.  Moderate caliber diagonal branch.  No obstructive disease noted. - CX: Large caliber vessel with 2 small OM branches and a moderate caliber PL branch.  No obstructive disease noted. - RCA: Large dominant vessel with 70 to 80% ostial stenosis.  40% proximal stenosis.  Distal stented segment is patent with no restenosis. Impression: 1. Single vessel CAD with ostial RCA stenosis, patent distal RCA stent 2. Severe aortic valve stenosis 3. Morbid obesity   Recommendations: She has progression of her CAD with ostial RCA stenosis and severe AS. She will need AVR and bypass of RCA. I will refer her to see one of our CT surgeons. She will be high risk for traditional AVR due to size. May have to consider TAVR/PCI of RCA.   - RCA disease managed medically. - S/p Transcatheter Aortic Valve Replacement - Transfemoral Approach             Edwards Sapien 3 THV (size 26 mm, model # 9600TFX,  serial # [id number]) 12/12/14.  Past Medical History:  Diagnosis Date   Anemia    Aortic valve stenosis, severe    Arthritis    PAIN AND SEVERE OA LEFT KNEE ; S/P RIGHT TKA ON 02/03/12; HAS LOWER BACK PAIN-UNABLE TO STAND MORE THAN 10 MIN; ARTHRITIS "ALL OVER"   Asthma    Blood transfusion    2013[facility]   Breast cancer in female [facility])    Right   CAD (coronary artery disease)    Cath 2010 with DES x 1 RCA-- PT'S CARDIOLOGIST IS DR. MCALHANY   Chronic diastolic congestive heart failure (HCC)    COPD (chronic obstructive pulmonary disease) ([place])    Depression    Diabetes mellitus DIAGNOSED IN2010   Controll s with diet   Dyspnea    with much ambulation   Eczema  on back   Headache    migraines younger- rare now 02/07/21   Heart murmur    History of hiatal hernia    History of kidney stones    passed or blasted   Hyperlipidemia    Hypertension    Morbid obesity with body mass index of 60.0-69.9 in adult [facility])    Myocardial infarction ([place])    PT THINKS SHE WAS DX WITH MI AT THE TIME OF HEART STENTING   Neuromuscular disorder ([place])    bilateral hands   Personal history of radiation therapy    Pneumonia    Pulmonary embolism ([place]) 02/08/2012   S/P RT TOTAL KNEE ON 02/03/12--ON 02/08/12--DEVELOPED ACUTE SOB AND CHEST PAIN--AND DIAGNOSED WITH  PULMONARY EMBOLUS AND PNEUMONIA   Restless leg syndrome    Sleep apnea    uses 3 liters O2 at night    Uterine fibroid    NO PROBLEMS AT PRESENT FROM THE FIBROIDS-STATES SHE IS POST MENOPAUSAL-LAST MENSES 2010 EXCEPT FOR EPISODE THIS YR OF BLEEDING RELATED TO FIBROIDS.   Weakness    BOTH HANDS - S/P BILATERAL CARPAL TUNNEL RELEASE--BUT STILL HAS WEAKNESS--OFTEN DROPS THINGS    Past Surgical History:  Procedure Laterality Date   BREAST BIOPSY Right 06/04/2018   BREAST LUMPECTOMY Right 06/2018   BREAST LUMPECTOMY WITH RADIOACTIVE SEED AND SENTINEL LYMPH NODE BIOPSY Right 07/19/2018   Procedure: BREAST LUMPECTOMY WITH RADIOACTIVE SEED AND  SENTINEL LYMPH NODE BIOPSY;  Surgeon: Alphonsa Overall, MD;  Location: [place];  Service: General;  Laterality: Right;   CARDIAC CATHETERIZATION     CARDIAC VALVE REPLACEMENT     2017   CARPAL TUNNEL RELEASE     Bilateral   CHOLECYSTECTOMY     CORONARY ANGIOPLASTY     2010 has stent in place   CYSTOSCOPY W/ RETROGRADES Right 09/21/2013   Procedure: CYSTOSCOPY WITH RIGHT RETROGRADE PYELOGRAM RIGHT DOUBLE J STENT ;  Surgeon: Fredricka Bonine, MD;  Location[facility];  Service: Urology;  Laterality: Right;   CYSTOSCOPY WITH URETEROSCOPY AND STENT PLACEMENT Right 10/25/2013   Procedure: CYSTOSCOPY RIGHT URETEROSCOPY HOLMIUM LASER LITHO AND STENT PLACEMENT;  Surgeon: Fredricka Bonine, MD;  Location: [facility];  Service: Urology;  Laterality: Right;   HERNIA REPAIR     INTRAOPERATIVE TRANSESOPHAGEAL ECHOCARDIOGRAM N/A 12/12/2014   Procedure: INTRAOPERATIVE TRANSESOPHAGEAL ECHOCARDIOGRAM;  Surgeon: Burnell Blanks, MD;  Location: [place];  Service: Cardiovascular;  Laterality: N/A;   JOINT REPLACEMENT     bil total knees   KNEE ARTHROPLASTY  02/03/2012   Procedure: COMPUTER ASSISTED TOTAL KNEE ARTHROPLASTY;  Surgeon: Mcarthur Rossetti, MD;  Location: [place];  Service: Orthopedics;  Laterality: Right;  Right total knee arthroplasty   LEFT AND RIGHT HEART CATHETERIZATION WITH CORONARY ANGIOGRAM N/A 03/17/2013   Procedure: LEFT AND RIGHT HEART CATHETERIZATION WITH CORONARY ANGIOGRAM;  Surgeon: Burnell Blanks, MD;  Location[facility] C[facility] LAB;  Service: Cardiovascular;  Laterality: N/A;   LEFT AND RIGHT HEART CATHETERIZATION WITH CORONARY/GRAFT ANGIOGRAM N/A 09/14/2014   Procedure: LEFT AND RIGHT HEART CATHETERIZATION WITH Beatrix Fetters;  Surgeon: Burnell Blanks, MD;  Location: [facility] CATH LAB;  Service: Cardiovascular;  Laterality: N/A;   TEE WITHOUT CARDIOVERSION N/A 03/14/2013   Procedure: TRANSESOPHAGEAL ECHOCARDIOGRAM (TEE);  Surgeon: Lelon Perla, MD;  Location:  [facility] ENDOSCOPY;  Service: Cardiovascular;  Laterality: N/A;   TEE WITHOUT CARDIOVERSION N/A 11/14/2014   Procedure: TRANSESOPHAGEAL ECHOCARDIOGRAM (TEE);  Surgeon: Lelon Perla, MD;  Location: [place];  Service: Cardiovascular;  Laterality:  N/A;   TONSILLECTOMY     maybe as a child- does not know   TOTAL KNEE ARTHROPLASTY  09/10/2012   Procedure: TOTAL KNEE ARTHROPLASTY;  Surgeon: Mcarthur Rossetti, MD;  [facility];  Service: Orthopedics;  Laterality: Left;   TOTAL KNEE REVISION Right 07/15/2013   Procedure: REVISION ARTHROPLASTY RIGHT KNEE;  Surgeon: Mcarthur Rossetti, MD;  Location[facility];  Service: Orthopedics;  Laterality: Right;   TRANSCATHETER AORTIC VALVE REPLACEMENT, TRANSFEMORAL N/A 12/12/2014   Procedure: TRANSCATHETER AORTIC VALVE REPLACEMENT, TRANSFEMORAL;  Surgeon: Burnell Blanks, MD;  Location: [place];  Service: Cardiovascular;  Laterality: N/A;   TRIGGER FINGER RELEASE  09/10/2012   Procedure: RELEASE TRIGGER FINGER/A-1 PULLEY;  Surgeon: Mcarthur Rossetti, MD;  Location[facility];  Service: Orthopedics;  Laterality: Right;  Right Ring Finger   TUBAL LIGATION      MEDICATIONS:  Accu-Chek FastClix Lancets MISC   acetaminophen (TYLENOL) 500 MG tablet   albuterol (VENTOLIN HFA) 108 (90 Base) MCG/ACT inhaler   amlodipine-atorvastatin (CADUET) 5-80 MG tablet   Ascorbic Acid (VITAMIN C) 1000 MG tablet   ASPIRIN ADULT LOW STRENGTH 81 MG EC tablet   Blood Glucose Monitoring Suppl (ACCU-CHEK GUIDE) w/Device KIT   busPIRone (BUSPAR) 5 MG tablet   clopidogrel (PLAVIX) 75 MG tablet   docusate sodium (COLACE) 100 MG capsule   DULoxetine (CYMBALTA) 60 MG capsule   Evolocumab (REPATHA SURECLICK) 161 MG/ML SOAJ   exemestane (AROMASIN) 25 MG tablet   ferrous gluconate (FERGON) 324 MG tablet   Fluticasone-Umeclidin-Vilant (TRELEGY ELLIPTA) 100-62.5-25 MCG/ACT AEPB   furosemide (LASIX) 40 MG tablet   gabapentin (NEURONTIN) 300 MG capsule   glucose blood  (ACCU-CHEK GUIDE) test strip   Insulin Pen Needle 30G X 5 MM MISC   liraglutide (VICTOZA) 18 MG/3ML SOPN   losartan-hydrochlorothiazide (HYZAAR) 100-25 MG tablet   meloxicam (MOBIC) 15 MG tablet   metoprolol succinate (TOPROL XL) 50 MG 24 hr tablet   nitrofurantoin, macrocrystal-monohydrate, (MACROBID) 100 MG capsule   nitroGLYCERIN (NITROSTAT) 0.4 MG SL tablet   Potassium 99 MG TABS   traMADol (ULTRAM) 50 MG tablet   No current facility-administered medications for this encounter.    Myra Gianotti, PA-C Surgical Short Stay/Anesthesiology [facility] Phone [phone] [facility] Phone [phone] 02/17/2022 4:23 PM

## 2022-02-17 NOTE — Telephone Encounter (Signed)
thx

## 2022-02-17 NOTE — Telephone Encounter (Signed)
At this point I will fax over [place] to [place] with Dr. Randel Pigg office as to plan of care.   I s/w the pt and she is agreeable to plan of care and has been scheduled to see Ermalinda Barrios, [facility] 02/19/22 @ 9:15. Pt is grateful for the help. I will forward notes to [facility] for upcoming appt. Will send FYI to [place] with Dr. Forbes Cellar office.

## 2022-02-17 NOTE — Telephone Encounter (Signed)
CORRECTION ON THE PRE OP NOTE ENTERED: THIS IS CLEARANCE IS FOR BOTH MEDICAL AND PHARM, AS PT IS ON ASA AND PLAVIX.

## 2022-02-17 NOTE — Telephone Encounter (Signed)
I s/w Debbie with Dr. Nicki Reaper Dean's office. I first was calling to confirm a DOB as no DOB was on the clearance form. Pt's DOB [birth year]. Once I confirmed the pt I confirmed surgery planned for tomorrow 02/18/22. I did inform Jackelyn Poling that we have not seen the pt since 03/2021 and she will need an appt for clearance, as well as the pt is also on blood thinners, Plavix and ASA. Jackelyn Poling tells me that she is pretty sure the pt has already stopped her blood thinners. Debbie, did tell me that the pt's procedure is most likely needing to be post poned as she has some other health issues going such as her A1c and a UTI. I informed Jackelyn Poling that I will contact pt and schedule an appt. Once I have the pt scheduled I will send FYI to [facility] as an update to let her know of appt. Jackelyn Poling is agreement with plan of care.

## 2022-02-18 DIAGNOSIS — Z01818 Encounter for other preprocedural examination: Secondary | ICD-10-CM

## 2022-02-18 NOTE — Progress Notes (Signed)
Cardiology Office Note    Date:  02/19/2022   ID:  [name], [name] Feb 14, 1961, MRN [id number]   PCP:  [name], [place]  Cardiologist:  Lauree Chandler, MD   Advanced Practice Provider:  No care team member to display Electrophysiologist:  None   [phone]   No chief complaint on file.   History of Present Illness:  [name] is a 61 y.o. female with history of severe aortic stenosis now s/p TAVR 2015 , CAD DES dRCA 01/2009 cath 2015 70% ostial RCA treated medically no obstructive disease in LAD or Cfx, OSA, HTN, HLD, obesity, GERD, asthma, COPD, former tobacco abuse, depression. Knee replacement 3016 complicated by PE treaded with coumadin 6 months.   Echo December 2016 with normally functioning bioprosthetic aortic valve. Most recent echo January 2022 with LVEF=60-65%, normal RV function. Mild mitral stenosis. Normally functioning AVR (TAVR).    Patient saw Dr. Angelena Form 04/03/2021 and was doing well.  Weight loss encouraged.  Patient on my schedule for cardiac clearance before undergoing shoulder replacement by Dr. Marlou Sa 03/06/2022. Patient had already stopped her Plavix but back on. Denies chest pressure, dyspnea, DOE, palpitations, dizziness or swelling. No regular exercise other than housework and taking care of her 6 dogs. Walks with a cane. Has gained about 15 lbs. Doesn't take her lasix daily.   Past Medical History:  Diagnosis Date   Anemia    Aortic valve stenosis, severe    Arthritis    PAIN AND SEVERE OA LEFT KNEE ; S/P RIGHT TKA ON 02/03/12; HAS LOWER BACK PAIN-UNABLE TO STAND MORE THAN 10 MIN; ARTHRITIS "ALL OVER"   Asthma    Blood transfusion    2013[facility]   Breast cancer in female [facility])    Right   CAD (coronary artery disease)    Cath 2010 with DES x 1 RCA-- PT'S CARDIOLOGIST IS DR. MCALHANY   Chronic diastolic congestive heart failure (HCC)    COPD (chronic obstructive pulmonary disease) ([place])     Depression    Diabetes mellitus DIAGNOSED IN2010   Controll s with diet   Dyspnea    with much ambulation   Eczema    on back   Headache    migraines younger- rare now 02/07/21   Heart murmur    History of hiatal hernia    History of kidney stones    passed or blasted   Hyperlipidemia    Hypertension    Morbid obesity with body mass index of 60.0-69.9 in adult [facility])    Myocardial infarction ([place])    PT THINKS SHE WAS DX WITH MI AT THE TIME OF HEART STENTING   Neuromuscular disorder ([place])    bilateral hands   Personal history of radiation therapy    Pneumonia    Pulmonary embolism ([place]) 02/08/2012   S/P RT TOTAL KNEE ON 02/03/12--ON 02/08/12--DEVELOPED ACUTE SOB AND CHEST PAIN--AND DIAGNOSED WITH  PULMONARY EMBOLUS AND PNEUMONIA   Restless leg syndrome    Sleep apnea    uses 3 liters O2 at night    Uterine fibroid    NO PROBLEMS AT PRESENT FROM THE FIBROIDS-STATES SHE IS POST MENOPAUSAL-LAST MENSES 2010 EXCEPT FOR EPISODE THIS YR OF BLEEDING RELATED TO FIBROIDS.   Weakness    BOTH HANDS - S/P BILATERAL CARPAL TUNNEL RELEASE--BUT STILL HAS WEAKNESS--OFTEN DROPS THINGS    Past Surgical History:  Procedure Laterality Date   BREAST BIOPSY Right 06/04/2018   BREAST LUMPECTOMY  Right 06/2018   BREAST LUMPECTOMY WITH RADIOACTIVE SEED AND SENTINEL LYMPH NODE BIOPSY Right 07/19/2018   Procedure: BREAST LUMPECTOMY WITH RADIOACTIVE SEED AND SENTINEL LYMPH NODE BIOPSY;  Surgeon: Alphonsa Overall, MD;  Location: [place];  Service: General;  Laterality: Right;   CARDIAC CATHETERIZATION     CARDIAC VALVE REPLACEMENT     2017   CARPAL TUNNEL RELEASE     Bilateral   CHOLECYSTECTOMY     CORONARY ANGIOPLASTY     2010 has stent in place   CYSTOSCOPY W/ RETROGRADES Right 09/21/2013   Procedure: CYSTOSCOPY WITH RIGHT RETROGRADE PYELOGRAM RIGHT DOUBLE J STENT ;  Surgeon: Fredricka Bonine, MD;  Location: [facility];  Service: Urology;  Laterality: Right;   CYSTOSCOPY WITH URETEROSCOPY AND STENT  PLACEMENT Right 10/25/2013   Procedure: CYSTOSCOPY RIGHT URETEROSCOPY HOLMIUM LASER LITHO AND STENT PLACEMENT;  Surgeon: Fredricka Bonine, MD;  Location: [facility];  Service: Urology;  Laterality: Right;   HERNIA REPAIR     INTRAOPERATIVE TRANSESOPHAGEAL ECHOCARDIOGRAM N/A 12/12/2014   Procedure: INTRAOPERATIVE TRANSESOPHAGEAL ECHOCARDIOGRAM;  Surgeon: Burnell Blanks, MD;  Location: [place];  Service: Cardiovascular;  Laterality: N/A;   JOINT REPLACEMENT     bil total knees   KNEE ARTHROPLASTY  02/03/2012   Procedure: COMPUTER ASSISTED TOTAL KNEE ARTHROPLASTY;  Surgeon: Mcarthur Rossetti, MD;  Location: [place];  Service: Orthopedics;  Laterality: Right;  Right total knee arthroplasty   LEFT AND RIGHT HEART CATHETERIZATION WITH CORONARY ANGIOGRAM N/A 03/17/2013   Procedure: LEFT AND RIGHT HEART CATHETERIZATION WITH CORONARY ANGIOGRAM;  Surgeon: Burnell Blanks, MD;  Location: [facility] C[facility] LAB;  Service: Cardiovascular;  Laterality: N/A;   LEFT AND RIGHT HEART CATHETERIZATION WITH CORONARY/GRAFT ANGIOGRAM N/A 09/14/2014   Procedure: LEFT AND RIGHT HEART CATHETERIZATION WITH Beatrix Fetters;  Surgeon: Burnell Blanks, MD;  Location: [facility] CATH LAB;  Service: Cardiovascular;  Laterality: N/A;   TEE WITHOUT CARDIOVERSION N/A 03/14/2013   Procedure: TRANSESOPHAGEAL ECHOCARDIOGRAM (TEE);  Surgeon: Lelon Perla, MD;  Location: [facility] ENDOSCOPY;  Service: Cardiovascular;  Laterality: N/A;   TEE WITHOUT CARDIOVERSION N/A 11/14/2014   Procedure: TRANSESOPHAGEAL ECHOCARDIOGRAM (TEE);  Surgeon: Lelon Perla, MD;  Location: [facility] ENDOSCOPY;  Service: Cardiovascular;  Laterality: N/A;   TONSILLECTOMY     maybe as a child- does not know   TOTAL KNEE ARTHROPLASTY  09/10/2012   Procedure: TOTAL KNEE ARTHROPLASTY;  Surgeon: Mcarthur Rossetti, MD;  [facility];  Service: Orthopedics;  Laterality: Left;   TOTAL KNEE REVISION Right 07/15/2013   Procedure: REVISION ARTHROPLASTY  RIGHT KNEE;  Surgeon: Mcarthur Rossetti, MD;  Location[facility];  Service: Orthopedics;  Laterality: Right;   TRANSCATHETER AORTIC VALVE REPLACEMENT, TRANSFEMORAL N/A 12/12/2014   Procedure: TRANSCATHETER AORTIC VALVE REPLACEMENT, TRANSFEMORAL;  Surgeon: Burnell Blanks, MD;  Location: [place];  Service: Cardiovascular;  Laterality: N/A;   TRIGGER FINGER RELEASE  09/10/2012   Procedure: RELEASE TRIGGER FINGER/A-1 PULLEY;  Surgeon: Mcarthur Rossetti, MD;  Location[facility];  Service: Orthopedics;  Laterality: Right;  Right Ring Finger   TUBAL LIGATION      Current Medications: Current Meds  Medication Sig   Accu-Chek FastClix Lancets MISC Check blood sugar two times a day   acetaminophen (TYLENOL) 500 MG tablet Take 1,000 mg by mouth every 8 (eight) hours as needed for headache.   albuterol (VENTOLIN HFA) 108 (90 Base) MCG/ACT inhaler Inhale 2 puffs into the lungs every 6 (six) hours as needed for wheezing or shortness of breath.  amlodipine-atorvastatin (CADUET) 5-80 MG tablet Take 1 tablet by mouth daily.   Ascorbic Acid (VITAMIN C) 1000 MG tablet Take 1,000 mg by mouth daily.   ASPIRIN ADULT LOW STRENGTH 81 MG EC tablet TAKE 1 Tablet BY MOUTH ONCE DAILY   Blood Glucose Monitoring Suppl (ACCU-CHEK GUIDE) w/Device KIT 1 each by Does not apply route 2 (two) times daily.   busPIRone (BUSPAR) 5 MG tablet TAKE 1 TABLET BY MOUTH THREE TIMES DAILY   clopidogrel (PLAVIX) 75 MG tablet TAKE 1 TABLET BY MOUTH IN THE MORNING   docusate sodium (COLACE) 100 MG capsule Take 1 capsule (100 mg total) by mouth 2 (two) times daily.   DULoxetine (CYMBALTA) 60 MG capsule Take 1 capsule by mouth once daily   Evolocumab (REPATHA SURECLICK) 435 MG/ML SOAJ INJECT 1 PEN INTO THE SKIN EVERY 14 DAYS   exemestane (AROMASIN) 25 MG tablet Take 1 tablet (25 mg total) by mouth daily after breakfast.   ferrous gluconate (FERGON) 324 MG tablet Take 1 tablet (324 mg total) by mouth 2 (two) times daily with a  meal.   Fluticasone-Umeclidin-Vilant (TRELEGY ELLIPTA) 100-62.5-25 MCG/ACT AEPB INHALE 1 PUFF ONCE DAILY Strength: 100-62.5-25 MCG/ACT   furosemide (LASIX) 40 MG tablet Take 1 tablet (40 mg total) by mouth as needed for fluid or edema.   gabapentin (NEURONTIN) 300 MG capsule Take 3 capsules po at bedtime and 1 capsule po in the AM   glucose blood (ACCU-CHEK GUIDE) test strip Check blood sugar 2 times per day   Insulin Pen Needle 30G X 5 MM MISC 1 application by Does not apply route daily.   liraglutide (VICTOZA) 18 MG/3ML SOPN INJECT 1.8 MG INTO THE SKIN ONCE DAILY Strength: 18 MG/3ML   losartan-hydrochlorothiazide (HYZAAR) 100-25 MG tablet Take 1 tablet by mouth daily.   meloxicam (MOBIC) 15 MG tablet Take 15 mg by mouth daily as needed for pain.   metoprolol succinate (TOPROL XL) 50 MG 24 hr tablet Take 1 tablet (50 mg total) by mouth daily. Take with or immediately following a meal.   nitrofurantoin, macrocrystal-monohydrate, (MACROBID) 100 MG capsule Take 1 capsule (100 mg total) by mouth 2 (two) times daily for 5 days.   nitroGLYCERIN (NITROSTAT) 0.4 MG SL tablet Place 1 tablet (0.4 mg total) under the tongue every 5 (five) minutes as needed for chest pain.   Potassium 99 MG TABS Take 99 mg by mouth daily.   traMADol (ULTRAM) 50 MG tablet TAKE 1 TABLET BY MOUTH EVERY 8 HOURS AS NEEDED     Allergies:   Patient has no known allergies.   Social History   Socioeconomic History   Marital status: Married    Spouse name: Not on file   Number of children: 2   Years of education: Not on file   Highest education level: Not on file  Occupational History   Occupation: Disabled  Tobacco Use   Smoking status: Former    Packs/day: 1.50    Years: 30.00    Pack years: 45.00    Types: Cigarettes    Quit date: 12/29/2000    Years since quitting: 21.1   Smokeless tobacco: Never  Vaping Use   Vaping Use: Never used  Substance and Sexual Activity   Alcohol use: Not Currently   Drug use: Not  Currently   Sexual activity: Not Currently    Birth control/protection: Surgical  Other Topics Concern   Not on file  Social History Narrative   Not on file   Social Determinants  of Health   Financial Resource Strain: Not on file  Food Insecurity: Not on file  Transportation Needs: Not on file  Physical Activity: Not on file  Stress: Not on file  Social Connections: Not on file     Family History:  The patient's  family history includes Asthma in her father; Breast cancer in her mother; COPD in her father; Cancer in her brother; Emphysema in her mother; Heart disease in her father and mother.   ROS:   Please see the history of present illness.    ROS All other systems reviewed and are negative.   PHYSICAL EXAM:   VS:  BP (!) 148/84    Pulse 92    Ht '5\' 3"'  (1.6 m)    Wt (!) 321 lb 6.4 oz (145.8 kg)    LMP 02/03/2012    SpO2 92%    BMI 56.93 kg/m   Physical Exam  GEN: Obese, in no acute distress  Neck: no JVD, carotid bruits, or masses Cardiac:RRR; no murmurs, rubs, or gallops  Respiratory:  clear to auscultation bilaterally, normal work of breathing GI: soft, nontender, nondistended, + BS Ext: without cyanosis, clubbing, or edema, Good distal pulses bilaterally MS: no deformity or atrophy  Skin: warm and dry, no rash Neuro:  Alert and Oriented x 3, Strength and sensation are intact Psych: euthymic mood, full affect  Wt Readings from Last 3 Encounters:  02/19/22 (!) 321 lb 6.4 oz (145.8 kg)  02/14/22 (!) 318 lb 1.6 oz (144.3 kg)  01/13/22 (!) 317 lb (143.8 kg)      Studies/Labs Reviewed:   EKG:  EKG is  ordered today.  The ekg ordered today demonstrates NSR poor R wave progression anteriorly no acute change  Recent Labs: 07/30/2021: ALT 17 02/14/2022: BUN 18; Creatinine, Ser 0.93; Hemoglobin 14.3; Platelets 238; Potassium 3.8; Sodium 138   Lipid Panel    Component Value Date/Time   CHOL 130 10/17/2020 0924   TRIG 84 10/17/2020 0924   HDL 50 10/17/2020 0924    CHOLHDL 2.6 10/17/2020 0924   CHOLHDL 3.7 12/28/2015 1052   VLDL 20 12/28/2015 1052   LDLCALC 64 10/17/2020 0924   LDLDIRECT 162.0 10/17/2013 0905    Additional studies/ records that were reviewed today include:    Cardiac cath 09/14/14: Left main: No obstructive disease.  Left Anterior Descending Artery: Large caliber vessel that courses to the apex. There is a moderate caliber diagonal branch. No obstructive disease noted.  Circumflex Artery: Large caliber vessel with two small obtuse marginal branches and a moderate caliber posterolateral branch. No obstructive disease noted.  Right Coronary Artery: Large dominant vessel with 70-80% ostial stenosis. 40% proximal stenosis. Distal stented segment is patent with no restenosis.  Left Ventricular Angiogram: Deferred.     Echo January 2022: 1. Left ventricular ejection fraction, by estimation, is 60 to 65%. The  left ventricle has normal function. The left ventricle has no regional  wall motion abnormalities. There is mild concentric left ventricular  hypertrophy. Left ventricular diastolic  parameters are indeterminate.   2. Right ventricular systolic function is normal. The right ventricular  size is normal.   3. The mitral valve is abnormal. No evidence of mitral valve  regurgitation. Mild mitral stenosis. The mean mitral valve gradient is 4.0  mmHg with average heart rate of 77 bpm.   4. The aortic valve has been repaired/replaced. Aortic valve  regurgitation is not visualized. There is a 26 mm Sapien prosthetic (TAVR)  valve present in  the aortic position. Procedure Date: 12/12/2014. Aortic  valve mean gradient measures 15.0 mmHg.   5. The inferior vena cava is normal in size with greater than 50%  respiratory variability, suggesting right atrial pressure of 3 mmHg.      Risk Assessment/Calculations:         ASSESSMENT:    1. Preoperative clearance   2. History of transcatheter aortic valve replacement (TAVR)   3.  Coronary artery disease involving native coronary artery of native heart without angina pectoris   4. Hyperlipidemia, unspecified hyperlipidemia type   5. Type 2 diabetes mellitus without complication, without long-term current use of insulin ([place])   6. Morbid obesity ([place])      PLAN:  In order of problems listed above:  Preop clearance for Left shoulder replacement 03/06/22 by Dr. Marlou Sa. Patient without angina, METS over 4 so she can proceed with surgery without any further cardiac testing. Dr. Angelena Form confirmed she can hold Plavix and ASA if needed 5 days prior to surgery. No need for SBE prophylaxis.  According to the Revised Cardiac Risk Index (RCRI), her Perioperative Risk of Major Cardiac Event is (%): 0.9  Her Functional Capacity in METs is: 4.3 according to the [employer] Activity Status Index (DASI).   Severe aortic stenosis status post TAVR 12/12/2014 valve working well by echo 12/2020 continue aspirin and Plavix given CAD and AVR.  SBE prophylaxis prior to select procedures including dental visits  CAD without angina cardiac cath 2015 moderate 70% ostial RCA stenosis patent RCA stent no disease in LAD or circumflex-no angina  Hyperlipidemia LDL goal less than 55. Check labs today  DM2 A1C 8.0 managed by PCP  Morbid obesity-exercise and weight loss discussed with patient.  Shared Decision Making/Informed Consent        Medication Adjustments/Labs and Tests Ordered: Current medicines are reviewed at length with the patient today.  Concerns regarding medicines are outlined above.  Medication changes, Labs and Tests ordered today are listed in the Patient Instructions below. Patient Instructions  Medication Instructions:  Your physician recommends that you continue on your current medications as directed. Please refer to the Current Medication list given to you today.  *If you need a refill on your cardiac medications before your next appointment, please call your  pharmacy*   Lab Work: None  If you have labs (blood work) drawn today and your tests are completely normal, you will receive your results only by: [place] (if you have MyChart) OR A paper copy in the mail If you have any lab test that is abnormal or we need to change your treatment, we will call you to review the results.   Follow-Up: At [facility], you and your health needs are our priority.  As part of our continuing mission to provide you with exceptional heart care, we have created designated Provider Care Teams.  These Care Teams include your primary Cardiologist (physician) and Advanced Practice Providers (APPs -  Physician Assistants and Nurse Practitioners) who all work together to provide you with the care you need, when you need it.  Your next appointment:   1 year(s)  The format for your next appointment:   In Person  Provider:   Lauree Chandler, MD     Other Instructions Your provider recommends that you maintain 150 minutes per week of moderate aerobic activity.  Calorie Counting for Weight Loss Calories are units of energy. Your body needs a certain number of calories from food to keep going throughout the  day. When you eat or drink more calories than your body needs, your body stores the extra calories mostly as fat. When you eat or drink fewer calories than your body needs, your body burns fat to get the energy it needs. Calorie counting means keeping track of how many calories you eat and drink each day. Calorie counting can be helpful if you need to lose weight. If you eat fewer calories than your body needs, you should lose weight. Ask your health care provider what a healthy weight is for you. For calorie counting to work, you will need to eat the right number of calories each day to lose a healthy amount of weight per week. A dietitian can help you figure out how many calories you need in a day and will suggest ways to reach your calorie goal. A  healthy amount of weight to lose each week is usually 1-2 lb (0.5-0.9 kg). This usually means that your daily calorie intake should be reduced by 500-750 calories. Eating 1,200-1,500 calories a day can help most women lose weight. Eating 1,500-1,800 calories a day can help most men lose weight. What do I need to know about calorie counting? Work with your health care provider or dietitian to determine how many calories you should get each day. To meet your daily calorie goal, you will need to: Find out how many calories are in each food that you would like to eat. Try to do this before you eat. Decide how much of the food you plan to eat. Keep a food log. Do this by writing down what you ate and how many calories it had. To successfully lose weight, it is important to balance calorie counting with a healthy lifestyle that includes regular activity. Where do I find calorie information? The number of calories in a food can be found on a Nutrition Facts label. If a food does not have a Nutrition Facts label, try to look up the calories online or ask your dietitian for help. Remember that calories are listed per serving. If you choose to have more than one serving of a food, you will have to multiply the calories per serving by the number of servings you plan to eat. For example, the label on a package of bread might say that a serving size is 1 slice and that there are 90 calories in a serving. If you eat 1 slice, you will have eaten 90 calories. If you eat 2 slices, you will have eaten 180 calories. How do I keep a food log? After each time that you eat, record the following in your food log as soon as possible: What you ate. Be sure to include toppings, sauces, and other extras on the food. How much you ate. This can be measured in cups, ounces, or number of items. How many calories were in each food and drink. The total number of calories in the food you ate. Keep your food log near you, such as in  a pocket-sized notebook or on an app or website on your mobile phone. Some programs will calculate calories for you and show you how many calories you have left to meet your daily goal. What are some portion-control tips? Know how many calories are in a serving. This will help you know how many servings you can have of a certain food. Use a measuring cup to measure serving sizes. You could also try weighing out portions on a kitchen scale. With time, you will be  able to estimate serving sizes for some foods. Take time to put servings of different foods on your favorite plates or in your favorite bowls and cups so you know what a serving looks like. Try not to eat straight from a food's packaging, such as from a bag or box. Eating straight from the package makes it hard to see how much you are eating and can lead to overeating. Put the amount you would like to eat in a cup or on a plate to make sure you are eating the right portion. Use smaller plates, glasses, and bowls for smaller portions and to prevent overeating. Try not to multitask. For example, avoid watching TV or using your computer while eating. If it is time to eat, sit down at a table and enjoy your food. This will help you recognize when you are full. It will also help you be more mindful of what and how much you are eating. What are tips for following this plan? Reading food labels Check the calorie count compared with the serving size. The serving size may be smaller than what you are used to eating. Check the source of the calories. Try to choose foods that are high in protein, fiber, and vitamins, and low in saturated fat, trans fat, and sodium. Shopping Read nutrition labels while you shop. This will help you make healthy decisions about which foods to buy. Pay attention to nutrition labels for low-fat or fat-free foods. These foods sometimes have the same number of calories or more calories than the full-fat versions. They also often  have added sugar, starch, or salt to make up for flavor that was removed with the fat. Make a grocery list of lower-calorie foods and stick to it. Cooking Try to cook your favorite foods in a healthier way. For example, try baking instead of frying. Use low-fat dairy products. Meal planning Use more fruits and vegetables. One-half of your plate should be fruits and vegetables. Include lean proteins, such as chicken, [place], and fish. Lifestyle Each week, aim to do one of the following: 150 minutes of moderate exercise, such as walking. 75 minutes of vigorous exercise, such as running. General information Know how many calories are in the foods you eat most often. This will help you calculate calorie counts faster. Find a way of tracking calories that works for you. Get creative. Try different apps or programs if writing down calories does not work for you. What foods should I eat?  Eat nutritious foods. It is better to have a nutritious, high-calorie food, such as an avocado, than a food with few nutrients, such as a bag of potato chips. Use your calories on foods and drinks that will fill you up and will not leave you hungry soon after eating. Examples of foods that fill you up are nuts and nut butters, vegetables, lean proteins, and high-fiber foods such as whole grains. High-fiber foods are foods with more than 5 g of fiber per serving. Pay attention to calories in drinks. Low-calorie drinks include water and unsweetened drinks. The items listed above may not be a complete list of foods and beverages you can eat. Contact a dietitian for more information. What foods should I limit? Limit foods or drinks that are not good sources of vitamins, minerals, or protein or that are high in unhealthy fats. These include: Candy. Other sweets. Sodas, specialty coffee drinks, alcohol, and juice. The items listed above may not be a complete list of foods and beverages you  should avoid. Contact a  dietitian for more information. How do I count calories when eating out? Pay attention to portions. Often, portions are much larger when eating out. Try these tips to keep portions smaller: Consider sharing a meal instead of getting your own. If you get your own meal, eat only half of it. Before you start eating, ask for a container and put half of your meal into it. When available, consider ordering smaller portions from the menu instead of full portions. Pay attention to your food and drink choices. Knowing the way food is cooked and what is included with the meal can help you eat fewer calories. If calories are listed on the menu, choose the lower-calorie options. Choose dishes that include vegetables, fruits, whole grains, low-fat dairy products, and lean proteins. Choose items that are boiled, broiled, grilled, or steamed. Avoid items that are buttered, battered, fried, or served with cream sauce. Items labeled as crispy are usually fried, unless stated otherwise. Choose water, low-fat milk, unsweetened iced tea, or other drinks without added sugar. If you want an alcoholic beverage, choose a lower-calorie option, such as a glass of wine or light beer. Ask for dressings, sauces, and syrups on the side. These are usually high in calories, so you should limit the amount you eat. If you want a salad, choose a garden salad and ask for grilled meats. Avoid extra toppings such as bacon, cheese, or fried items. Ask for the dressing on the side, or ask for olive oil and vinegar or lemon to use as dressing. Estimate how many servings of a food you are given. Knowing serving sizes will help you be aware of how much food you are eating at restaurants. Where to find more information Centers for Disease Control and Prevention: http://www.wolf.info/ U.S. Department of Agriculture: http://www.wilson-mendoza.org/ Summary Calorie counting means keeping track of how many calories you eat and drink each day. If you eat fewer calories than  your body needs, you should lose weight. A healthy amount of weight to lose per week is usually 1-2 lb (0.5-0.9 kg). This usually means reducing your daily calorie intake by 500-750 calories. The number of calories in a food can be found on a Nutrition Facts label. If a food does not have a Nutrition Facts label, try to look up the calories online or ask your dietitian for help. Use smaller plates, glasses, and bowls for smaller portions and to prevent overeating. Use your calories on foods and drinks that will fill you up and not leave you hungry shortly after a meal. This information is not intended to replace advice given to you by your health care provider. Make sure you discuss any questions you have with your health care provider. Document Revised: 01/26/2020 Document Reviewed: 01/26/2020 Elsevier Patient Education  2022 Marine, Ermalinda Barrios, [place]  02/19/2022 9:30 AM    [facility] [address], [place], [place]  [address] Phone: [phone]; Fax: [phone]

## 2022-02-19 ENCOUNTER — Encounter: Payer: Self-pay | Admitting: Physician Assistant

## 2022-02-19 ENCOUNTER — Other Ambulatory Visit: Payer: Self-pay

## 2022-02-19 ENCOUNTER — Ambulatory Visit (INDEPENDENT_AMBULATORY_CARE_PROVIDER_SITE_OTHER): Payer: Medicaid Other | Admitting: Physician Assistant

## 2022-02-19 VITALS — BP 148/84 | HR 92 | Ht 63.0 in | Wt 321.4 lb

## 2022-02-19 DIAGNOSIS — I2510 Atherosclerotic heart disease of native coronary artery without angina pectoris: Secondary | ICD-10-CM

## 2022-02-19 DIAGNOSIS — E785 Hyperlipidemia, unspecified: Secondary | ICD-10-CM

## 2022-02-19 DIAGNOSIS — Z952 Presence of prosthetic heart valve: Secondary | ICD-10-CM

## 2022-02-19 DIAGNOSIS — Z01818 Encounter for other preprocedural examination: Secondary | ICD-10-CM

## 2022-02-19 DIAGNOSIS — E119 Type 2 diabetes mellitus without complications: Secondary | ICD-10-CM | POA: Diagnosis not present

## 2022-02-19 LAB — LIPID PANEL
Chol/HDL Ratio: 3.6 ratio (ref 0.0–4.4)
Cholesterol, Total: 170 mg/dL (ref 100–199)
HDL: 47 mg/dL (ref >39)
LDL Chol Calc (NIH): 106 mg/dL — ABNORMAL HIGH (ref 0–99)
Triglycerides: 91 mg/dL (ref 0–149)
VLDL Cholesterol Cal: 17 mg/dL (ref 5–40)

## 2022-02-19 NOTE — Patient Instructions (Signed)
Medication Instructions:  Your physician recommends that you continue on your current medications as directed. Please refer to the Current Medication list given to you today.  *If you need a refill on your cardiac medications before your next appointment, please call your pharmacy*   Lab Work: None  If you have labs (blood work) drawn today and your tests are completely normal, you will receive your results only by: [place] (if you have MyChart) OR A paper copy in the mail If you have any lab test that is abnormal or we need to change your treatment, we will call you to review the results.   Follow-Up: At [facility], you and your health needs are our priority.  As part of our continuing mission to provide you with exceptional heart care, we have created designated Provider Care Teams.  These Care Teams include your primary Cardiologist (physician) and Advanced Practice Providers (APPs -  Physician Assistants and Nurse Practitioners) who all work together to provide you with the care you need, when you need it.  Your next appointment:   1 year(s)  The format for your next appointment:   In Person  Provider:   Lauree Chandler, MD     Other Instructions Your provider recommends that you maintain 150 minutes per week of moderate aerobic activity.  Calorie Counting for Weight Loss Calories are units of energy. Your body needs a certain number of calories from food to keep going throughout the day. When you eat or drink more calories than your body needs, your body stores the extra calories mostly as fat. When you eat or drink fewer calories than your body needs, your body burns fat to get the energy it needs. Calorie counting means keeping track of how many calories you eat and drink each day. Calorie counting can be helpful if you need to lose weight. If you eat fewer calories than your body needs, you should lose weight. Ask your health care provider what a healthy  weight is for you. For calorie counting to work, you will need to eat the right number of calories each day to lose a healthy amount of weight per week. A dietitian can help you figure out how many calories you need in a day and will suggest ways to reach your calorie goal. A healthy amount of weight to lose each week is usually 1-2 lb (0.5-0.9 kg). This usually means that your daily calorie intake should be reduced by 500-750 calories. Eating 1,200-1,500 calories a day can help most women lose weight. Eating 1,500-1,800 calories a day can help most men lose weight. What do I need to know about calorie counting? Work with your health care provider or dietitian to determine how many calories you should get each day. To meet your daily calorie goal, you will need to: Find out how many calories are in each food that you would like to eat. Try to do this before you eat. Decide how much of the food you plan to eat. Keep a food log. Do this by writing down what you ate and how many calories it had. To successfully lose weight, it is important to balance calorie counting with a healthy lifestyle that includes regular activity. Where do I find calorie information? The number of calories in a food can be found on a Nutrition Facts label. If a food does not have a Nutrition Facts label, try to look up the calories online or ask your dietitian for help. Remember that calories  are listed per serving. If you choose to have more than one serving of a food, you will have to multiply the calories per serving by the number of servings you plan to eat. For example, the label on a package of bread might say that a serving size is 1 slice and that there are 90 calories in a serving. If you eat 1 slice, you will have eaten 90 calories. If you eat 2 slices, you will have eaten 180 calories. How do I keep a food log? After each time that you eat, record the following in your food log as soon as possible: What you ate. Be  sure to include toppings, sauces, and other extras on the food. How much you ate. This can be measured in cups, ounces, or number of items. How many calories were in each food and drink. The total number of calories in the food you ate. Keep your food log near you, such as in a pocket-sized notebook or on an app or website on your mobile phone. Some programs will calculate calories for you and show you how many calories you have left to meet your daily goal. What are some portion-control tips? Know how many calories are in a serving. This will help you know how many servings you can have of a certain food. Use a measuring cup to measure serving sizes. You could also try weighing out portions on a kitchen scale. With time, you will be able to estimate serving sizes for some foods. Take time to put servings of different foods on your favorite plates or in your favorite bowls and cups so you know what a serving looks like. Try not to eat straight from a food's packaging, such as from a bag or box. Eating straight from the package makes it hard to see how much you are eating and can lead to overeating. Put the amount you would like to eat in a cup or on a plate to make sure you are eating the right portion. Use smaller plates, glasses, and bowls for smaller portions and to prevent overeating. Try not to multitask. For example, avoid watching TV or using your computer while eating. If it is time to eat, sit down at a table and enjoy your food. This will help you recognize when you are full. It will also help you be more mindful of what and how much you are eating. What are tips for following this plan? Reading food labels Check the calorie count compared with the serving size. The serving size may be smaller than what you are used to eating. Check the source of the calories. Try to choose foods that are high in protein, fiber, and vitamins, and low in saturated fat, trans fat, and sodium. Shopping Read  nutrition labels while you shop. This will help you make healthy decisions about which foods to buy. Pay attention to nutrition labels for low-fat or fat-free foods. These foods sometimes have the same number of calories or more calories than the full-fat versions. They also often have added sugar, starch, or salt to make up for flavor that was removed with the fat. Make a grocery list of lower-calorie foods and stick to it. Cooking Try to cook your favorite foods in a healthier way. For example, try baking instead of frying. Use low-fat dairy products. Meal planning Use more fruits and vegetables. One-half of your plate should be fruits and vegetables. Include lean proteins, such as chicken, [place], and fish. Lifestyle Each  week, aim to do one of the following: 150 minutes of moderate exercise, such as walking. 75 minutes of vigorous exercise, such as running. General information Know how many calories are in the foods you eat most often. This will help you calculate calorie counts faster. Find a way of tracking calories that works for you. Get creative. Try different apps or programs if writing down calories does not work for you. What foods should I eat?  Eat nutritious foods. It is better to have a nutritious, high-calorie food, such as an avocado, than a food with few nutrients, such as a bag of potato chips. Use your calories on foods and drinks that will fill you up and will not leave you hungry soon after eating. Examples of foods that fill you up are nuts and nut butters, vegetables, lean proteins, and high-fiber foods such as whole grains. High-fiber foods are foods with more than 5 g of fiber per serving. Pay attention to calories in drinks. Low-calorie drinks include water and unsweetened drinks. The items listed above may not be a complete list of foods and beverages you can eat. Contact a dietitian for more information. What foods should I limit? Limit foods or drinks that are  not good sources of vitamins, minerals, or protein or that are high in unhealthy fats. These include: Candy. Other sweets. Sodas, specialty coffee drinks, alcohol, and juice. The items listed above may not be a complete list of foods and beverages you should avoid. Contact a dietitian for more information. How do I count calories when eating out? Pay attention to portions. Often, portions are much larger when eating out. Try these tips to keep portions smaller: Consider sharing a meal instead of getting your own. If you get your own meal, eat only half of it. Before you start eating, ask for a container and put half of your meal into it. When available, consider ordering smaller portions from the menu instead of full portions. Pay attention to your food and drink choices. Knowing the way food is cooked and what is included with the meal can help you eat fewer calories. If calories are listed on the menu, choose the lower-calorie options. Choose dishes that include vegetables, fruits, whole grains, low-fat dairy products, and lean proteins. Choose items that are boiled, broiled, grilled, or steamed. Avoid items that are buttered, battered, fried, or served with cream sauce. Items labeled as crispy are usually fried, unless stated otherwise. Choose water, low-fat milk, unsweetened iced tea, or other drinks without added sugar. If you want an alcoholic beverage, choose a lower-calorie option, such as a glass of wine or light beer. Ask for dressings, sauces, and syrups on the side. These are usually high in calories, so you should limit the amount you eat. If you want a salad, choose a garden salad and ask for grilled meats. Avoid extra toppings such as bacon, cheese, or fried items. Ask for the dressing on the side, or ask for olive oil and vinegar or lemon to use as dressing. Estimate how many servings of a food you are given. Knowing serving sizes will help you be aware of how much food you are  eating at restaurants. Where to find more information Centers for Disease Control and Prevention: http://www.wolf.info/ U.S. Department of Agriculture: http://www.wilson-mendoza.org/ Summary Calorie counting means keeping track of how many calories you eat and drink each day. If you eat fewer calories than your body needs, you should lose weight. A healthy amount of weight to lose  per week is usually 1-2 lb (0.5-0.9 kg). This usually means reducing your daily calorie intake by 500-750 calories. The number of calories in a food can be found on a Nutrition Facts label. If a food does not have a Nutrition Facts label, try to look up the calories online or ask your dietitian for help. Use smaller plates, glasses, and bowls for smaller portions and to prevent overeating. Use your calories on foods and drinks that will fill you up and not leave you hungry shortly after a meal. This information is not intended to replace advice given to you by your health care provider. Make sure you discuss any questions you have with your health care provider. Document Revised: 01/26/2020 Document Reviewed: 01/26/2020 Elsevier Patient Education  2022 [employer].

## 2022-02-19 NOTE — Anesthesia Preprocedure Evaluation (Addendum)
Anesthesia Evaluation  Patient identified by MRN, date of birth, ID band Patient awake    Reviewed: Allergy & Precautions, NPO status , Patient's Chart, lab work & pertinent test results, reviewed documented beta blocker date and time   Airway Mallampati: III  TM Distance: >3 FB Neck ROM: Full  Mouth opening: Limited Mouth Opening  Dental no notable dental hx. (+) Teeth Intact, Dental Advisory Given   Pulmonary asthma , sleep apnea and Oxygen sleep apnea , COPD (3L O2 at night),  COPD inhaler and oxygen dependent, former smoker, PE   Pulmonary exam normal breath sounds clear to auscultation       Cardiovascular hypertension, Pt. on medications and Pt. on home beta blockers + CAD, + Past MI, + Cardiac Stents and +CHF  Normal cardiovascular exam+ Valvular Problems/Murmurs (s/p TAVR 2015) AS  Rhythm:Regular Rate:Normal  TTE 2022 1. Left ventricular ejection fraction, by estimation, is 60 to 65%. The  left ventricle has normal function. The left ventricle has no regional  wall motion abnormalities. There is mild concentric left ventricular  hypertrophy. Left ventricular diastolic  parameters are indeterminate.  2. Right ventricular systolic function is normal. The right ventricular  size is normal.  3. The mitral valve is abnormal. No evidence of mitral valve  regurgitation. Mild mitral stenosis. The mean mitral valve gradient is 4.0  mmHg with average heart rate of 77 bpm.  4. The aortic valve has been repaired/replaced. Aortic valve  regurgitation is not visualized. 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 Procedure Date: 12/12/2014. Aortic  valve mean gradient measures 15.0 mmHg.  5. The inferior vena cava is normal in size with greater than 50%  respiratory variability, suggesting right atrial pressure of 3 mmHg.    Neuro/Psych  Headaches, PSYCHIATRIC DISORDERS Anxiety Depression     GI/Hepatic Neg liver ROS, hiatal hernia, GERD  ,  Endo/Other  diabetes, Type 2, Insulin DependentMorbid obesity (BMI 57)  Renal/GU negative Renal ROS  negative genitourinary   Musculoskeletal negative musculoskeletal ROS (+)   Abdominal   Peds  Hematology  (+) Blood dyscrasia (on plavix), anemia ,   Anesthesia Other Findings History includes former smoker, asthma, COPD (uses 2L O2 at night, no CPAP), HTN, HLD, aortic stenosis (s/p TAVR, 20mm 12/12/14), mitral stenosis (mild 01/23/21 echo), 1VCAD (DES dRCA 02/08/09; ~ 70% ostial RCA with patent dRCA stent 09/14/14, medically managed), OSA (uses 2L O2 at night, not CPAP), GERD, breast cancer (right lumpectomy/LN sampling 07/19/18 "for 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 BMI is consistent with morbid obesity.   Reproductive/Obstetrics                           Anesthesia Physical Anesthesia Plan  ASA: 3  Anesthesia Plan: General and Regional   Post-op Pain Management: Regional block* and Tylenol PO (pre-op)*   Induction: Intravenous  PONV Risk Score and Plan: 3 and Midazolam, Dexamethasone and Ondansetron  Airway Management Planned: Oral ETT  Additional Equipment:   Intra-op Plan:   Post-operative Plan: Extubation in OR  Informed Consent: I have reviewed the patients History and Physical, chart, labs and discussed the procedure including  the risks, benefits and alternatives for the proposed anesthesia with the patient or authorized representative who has indicated his/her understanding and acceptance.     Dental advisory given  Plan Discussed with:  CRNA  Anesthesia Plan Comments: (PAT note written by Myra Gianotti, PA-C. )       Anesthesia Quick Evaluation

## 2022-02-25 ENCOUNTER — Other Ambulatory Visit: Payer: Self-pay

## 2022-02-25 MED ORDER — EZETIMIBE 10 MG PO TABS: 10.0000 mg | ORAL_TABLET | Freq: Every day | ORAL | 3 refills | Status: DC

## 2022-02-26 DIAGNOSIS — Z419 Encounter for procedure for purposes other than remedying health state, unspecified: Secondary | ICD-10-CM | POA: Diagnosis not present

## 2022-03-03 ENCOUNTER — Other Ambulatory Visit (HOSPITAL_COMMUNITY)
Admission: RE | Admit: 2022-03-03 | Discharge: 2022-03-03 | Disposition: A | Discharge status: Home / Self Care | Payer: Medicaid Other | Source: Ambulatory Visit | Attending: Orthopedic Surgery | Admitting: Orthopedic Surgery

## 2022-03-03 DIAGNOSIS — Z01812 Encounter for preprocedural laboratory examination: Secondary | ICD-10-CM | POA: Insufficient documentation

## 2022-03-03 DIAGNOSIS — Z20822 Contact with and (suspected) exposure to covid-19: Secondary | ICD-10-CM | POA: Insufficient documentation

## 2022-03-03 LAB — SARS CORONAVIRUS 2 (TAT 6-24 HRS): SARS Coronavirus 2: NEGATIVE

## 2022-03-04 ENCOUNTER — Encounter (HOSPITAL_COMMUNITY): Payer: Self-pay | Admitting: Orthopedic Surgery

## 2022-03-04 ENCOUNTER — Other Ambulatory Visit: Payer: Self-pay

## 2022-03-04 NOTE — Progress Notes (Signed)
DUE TO COVID-19 ONLY ONE VISITOR IS ALLOWED TO COME WITH YOU AND STAY IN THE WAITING ROOM ONLY DURING PRE OP AND PROCEDURE DAY OF SURGERY.  ? ?Two VISITORS MAY VISIT WITH YOU AFTER SURGERY IN YOUR PRIVATE ROOM DURING VISITING HOURS ONLY! ? ?Internal Med - Dr [name] ?Cardiologist - Dr Lauree Chandler  ? ?Chest x-ray - n/a ?EKG - 02/19/22 ?Stress Test - 02/28/13 ?ECHO - 01/23/21 ?Cardiac Cath - 09/14/14 ? ?ICD Pacemaker/Loop - n/a ? ?Sleep Study -  Yes ?CPAP - does not use CPAP, Uses Oxygen at night 3 L. ? ?Do not take Victoza injection on the morning of surgery. ? ?If your blood sugar is less than 70 mg/dL, you will need to treat for low blood sugar: ?Treat a low blood sugar (less than 70 mg/dL) with ? cup of clear juice (cranberry or apple), 4 glucose tablets, OR glucose gel. ?Recheck blood sugar in 15 minutes after treatment (to make sure it is greater than 70 mg/dL). If your blood sugar is not greater than 70 mg/dL on recheck, call [phone] for further instructions. ? ?Aspirin/Blood Thinner Instructions: Follow your surgeon's instructions on when to stop Plavix and Aspirin prior to surgery (hold 5 days prior to procedure per Dr Julianne Handler).  Last dose ASA and Plavix was on 02/26/22. ? ?Anesthesia review: Yes ? ?STOP now taking any Aspirin (unless otherwise instructed by your surgeon), Aleve, Naproxen, Ibuprofen, Motrin, Advil, Goody's, BC's, all herbal medications, fish oil, and all vitamins.  ? ?Coronavirus Screening ?Covid test on 03/04/22 is negative.  ?Do you have any of the following symptoms:  ?Cough yes/no: No ?Fever (>100.52F)  yes/no: No ?Runny nose yes/no: No ?Sore throat yes/no: No ?Difficulty breathing/shortness of breath  yes/no: No ? ?Have you traveled in the last 14 days and where? yes/no: No ? ?Patient verbalized understanding of instructions that were reviewed via phone.  Patient had PAT appt. on 02/14/22 but was r/s due to not holding Plavix and ASA five days prior to procedure per MD.. ?   ?

## 2022-03-05 ENCOUNTER — Other Ambulatory Visit: Payer: Self-pay | Admitting: Internal Medicine

## 2022-03-05 ENCOUNTER — Encounter: Payer: Medicaid Other | Admitting: Orthopedic Surgery

## 2022-03-05 DIAGNOSIS — J441 Chronic obstructive pulmonary disease with (acute) exacerbation: Secondary | ICD-10-CM

## 2022-03-06 ENCOUNTER — Ambulatory Visit (HOSPITAL_BASED_OUTPATIENT_CLINIC_OR_DEPARTMENT_OTHER): Payer: Medicaid Other | Admitting: Vascular Surgery

## 2022-03-06 ENCOUNTER — Encounter (HOSPITAL_COMMUNITY)
Admission: RE | Disposition: A | Discharge status: Home / Self Care | Payer: Self-pay | Source: Ambulatory Visit | Attending: Orthopedic Surgery

## 2022-03-06 ENCOUNTER — Other Ambulatory Visit: Payer: Self-pay

## 2022-03-06 ENCOUNTER — Encounter (HOSPITAL_COMMUNITY): Payer: Self-pay | Admitting: Orthopedic Surgery

## 2022-03-06 ENCOUNTER — Observation Stay (HOSPITAL_COMMUNITY)
Admission: RE | Admit: 2022-03-06 | Discharge: 2022-03-07 | Disposition: A | Discharge status: Home / Self Care | Payer: Medicaid Other | Source: Ambulatory Visit | Attending: Orthopedic Surgery | Admitting: Orthopedic Surgery

## 2022-03-06 ENCOUNTER — Ambulatory Visit (HOSPITAL_COMMUNITY): Payer: Medicaid Other

## 2022-03-06 ENCOUNTER — Ambulatory Visit (HOSPITAL_COMMUNITY): Payer: Medicaid Other | Admitting: Vascular Surgery

## 2022-03-06 DIAGNOSIS — I5032 Chronic diastolic (congestive) heart failure: Secondary | ICD-10-CM | POA: Insufficient documentation

## 2022-03-06 DIAGNOSIS — Z955 Presence of coronary angioplasty implant and graft: Secondary | ICD-10-CM | POA: Diagnosis not present

## 2022-03-06 DIAGNOSIS — J90 Pleural effusion, not elsewhere classified: Secondary | ICD-10-CM | POA: Diagnosis not present

## 2022-03-06 DIAGNOSIS — I2510 Atherosclerotic heart disease of native coronary artery without angina pectoris: Secondary | ICD-10-CM

## 2022-03-06 DIAGNOSIS — E119 Type 2 diabetes mellitus without complications: Secondary | ICD-10-CM | POA: Diagnosis not present

## 2022-03-06 DIAGNOSIS — Z96612 Presence of left artificial shoulder joint: Secondary | ICD-10-CM | POA: Diagnosis not present

## 2022-03-06 DIAGNOSIS — Z794 Long term (current) use of insulin: Secondary | ICD-10-CM | POA: Insufficient documentation

## 2022-03-06 DIAGNOSIS — Z7902 Long term (current) use of antithrombotics/antiplatelets: Secondary | ICD-10-CM | POA: Insufficient documentation

## 2022-03-06 DIAGNOSIS — Z853 Personal history of malignant neoplasm of breast: Secondary | ICD-10-CM | POA: Diagnosis not present

## 2022-03-06 DIAGNOSIS — M19012 Primary osteoarthritis, left shoulder: Secondary | ICD-10-CM | POA: Diagnosis not present

## 2022-03-06 DIAGNOSIS — J449 Chronic obstructive pulmonary disease, unspecified: Secondary | ICD-10-CM | POA: Diagnosis not present

## 2022-03-06 DIAGNOSIS — Z7984 Long term (current) use of oral hypoglycemic drugs: Secondary | ICD-10-CM | POA: Diagnosis not present

## 2022-03-06 DIAGNOSIS — J45909 Unspecified asthma, uncomplicated: Secondary | ICD-10-CM | POA: Insufficient documentation

## 2022-03-06 DIAGNOSIS — I110 Hypertensive heart disease with heart failure: Secondary | ICD-10-CM | POA: Insufficient documentation

## 2022-03-06 DIAGNOSIS — Z87891 Personal history of nicotine dependence: Secondary | ICD-10-CM | POA: Insufficient documentation

## 2022-03-06 DIAGNOSIS — Z9889 Other specified postprocedural states: Secondary | ICD-10-CM

## 2022-03-06 DIAGNOSIS — Z7982 Long term (current) use of aspirin: Secondary | ICD-10-CM | POA: Insufficient documentation

## 2022-03-06 DIAGNOSIS — Z79899 Other long term (current) drug therapy: Secondary | ICD-10-CM | POA: Insufficient documentation

## 2022-03-06 DIAGNOSIS — Z96653 Presence of artificial knee joint, bilateral: Secondary | ICD-10-CM | POA: Insufficient documentation

## 2022-03-06 DIAGNOSIS — Z96642 Presence of left artificial hip joint: Secondary | ICD-10-CM | POA: Diagnosis not present

## 2022-03-06 DIAGNOSIS — Z86711 Personal history of pulmonary embolism: Secondary | ICD-10-CM | POA: Insufficient documentation

## 2022-03-06 DIAGNOSIS — Z471 Aftercare following joint replacement surgery: Secondary | ICD-10-CM | POA: Diagnosis not present

## 2022-03-06 DIAGNOSIS — I509 Heart failure, unspecified: Secondary | ICD-10-CM

## 2022-03-06 DIAGNOSIS — Z01818 Encounter for other preprocedural examination: Secondary | ICD-10-CM

## 2022-03-06 DIAGNOSIS — G8918 Other acute postprocedural pain: Secondary | ICD-10-CM | POA: Diagnosis not present

## 2022-03-06 HISTORY — DX: Anxiety disorder, unspecified: F41.9

## 2022-03-06 HISTORY — DX: Dependence on supplemental oxygen: Z99.81

## 2022-03-06 HISTORY — PX: REVERSE SHOULDER ARTHROPLASTY: SHX5054

## 2022-03-06 LAB — GLUCOSE, CAPILLARY
Glucose-Capillary: 274 mg/dL — ABNORMAL HIGH (ref 70–99)
Glucose-Capillary: 184 mg/dL — ABNORMAL HIGH (ref 70–99)
Glucose-Capillary: 173 mg/dL — ABNORMAL HIGH (ref 70–99)
Glucose-Capillary: 294 mg/dL — ABNORMAL HIGH (ref 70–99)

## 2022-03-06 IMAGING — DX DG SHOULDER 1V*L*
1 series · 1 of 1 positions shown · non-contrast
Comparison: Left shoulder x-ray [DATE].

CLINICAL DATA: Status post reverse total shoulder arthroplasty.

EXAM:
LEFT SHOULDER

[shoulder]
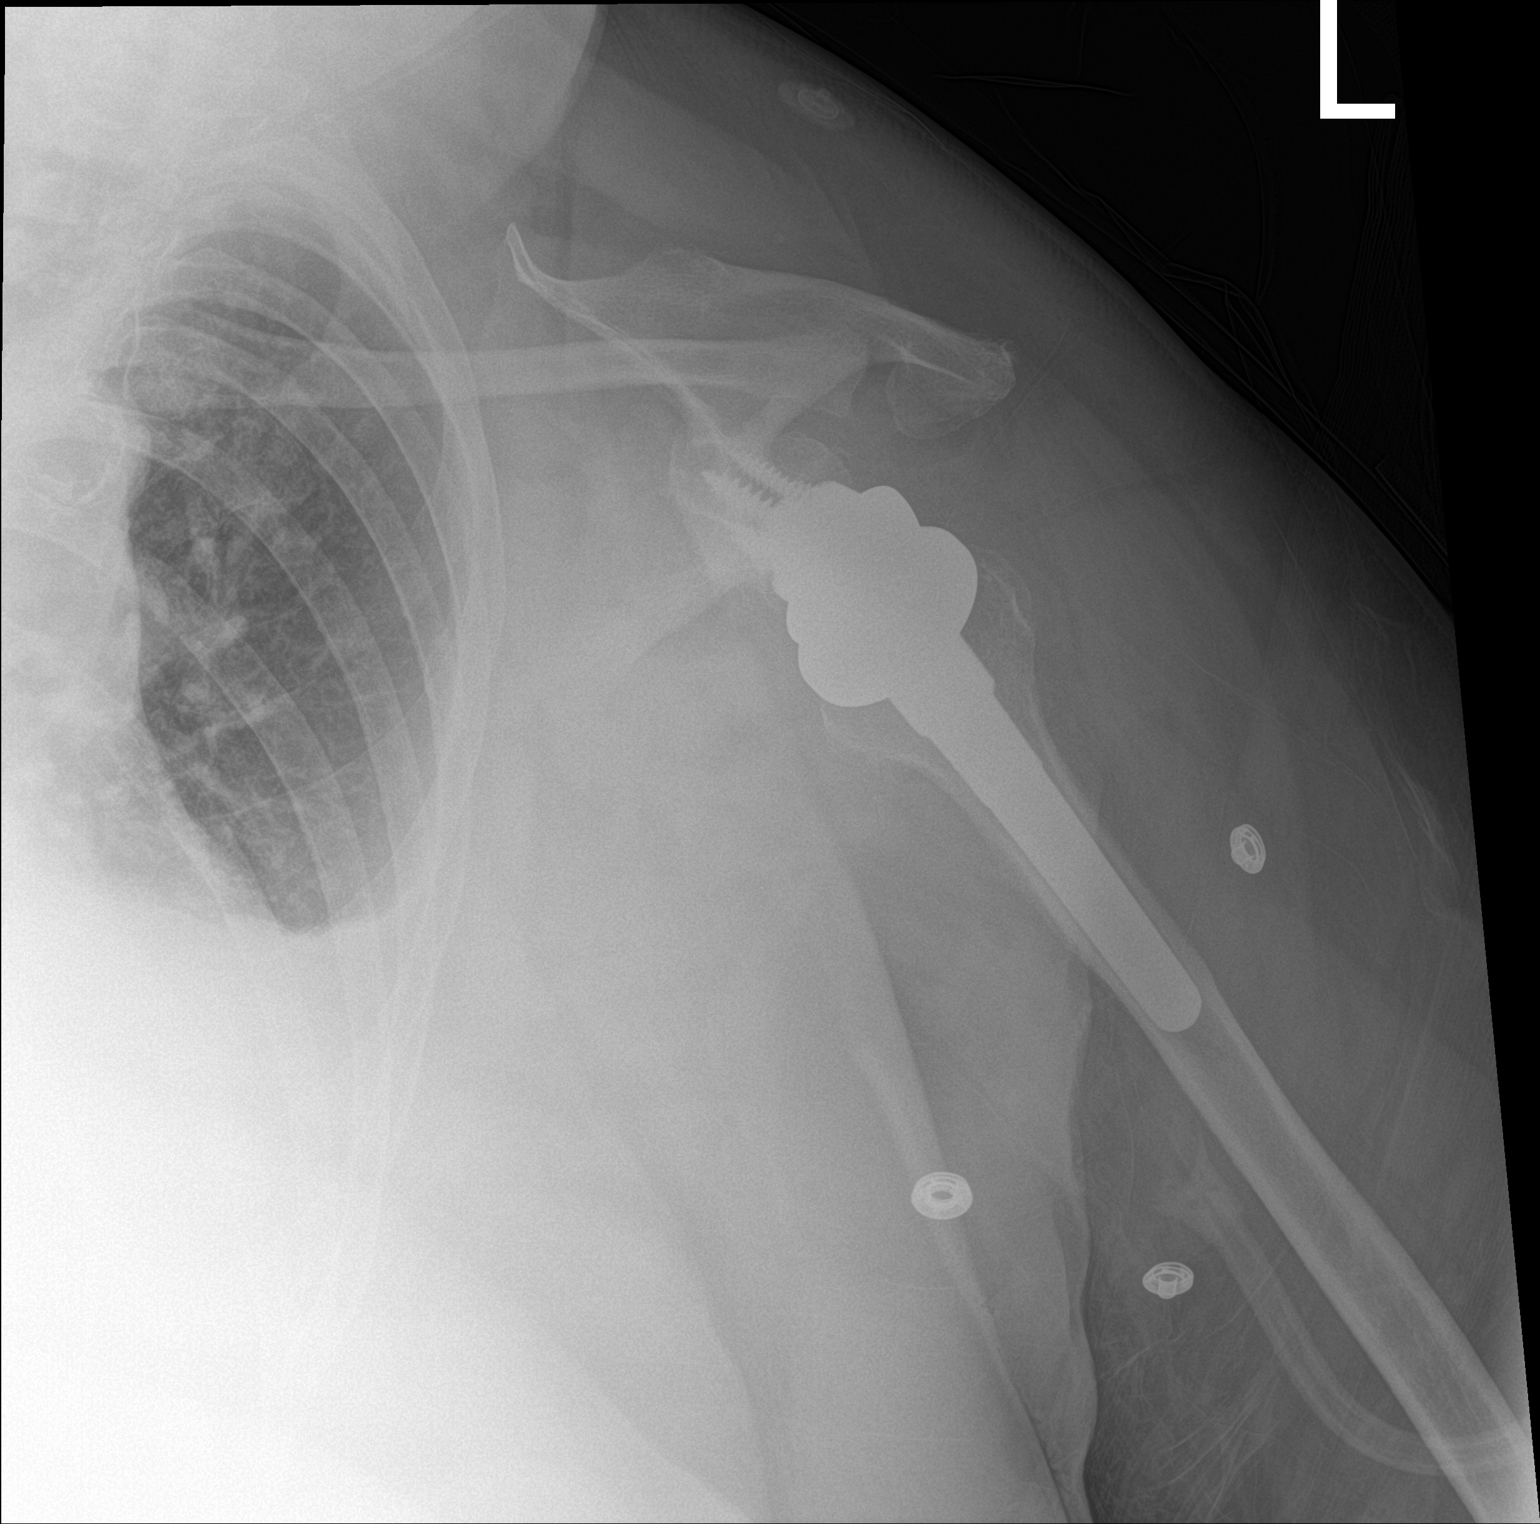

[1 of 1 positions shown; findings below may reference images not displayed]

FINDINGS: There is a new left shoulder arthroplasty in place in anatomic
alignment. There is no evidence for fracture or hardware loosening.
Soft tissues are otherwise within normal limits. Small left pleural
effusion is present.
IMPRESSION: 1. New left shoulder arthroplasty in anatomic alignment.
2. Small left pleural effusion.

## 2022-03-06 SURGERY — ARTHROPLASTY, SHOULDER, TOTAL, REVERSE
Anesthesia: Regional | Site: Shoulder | Laterality: Left

## 2022-03-06 MED ORDER — PHENOL 1.4 % MT LIQD: 1.0000 | OROMUCOSAL | Status: DC | PRN

## 2022-03-06 MED ORDER — MIDAZOLAM HCL 2 MG/2ML IJ SOLN: 2.0000 mg | Freq: Once | INTRAMUSCULAR | Status: AC

## 2022-03-06 MED ORDER — VANCOMYCIN HCL 1000 MG IV SOLR
INTRAVENOUS | Status: AC
  Filled 2022-03-06: qty 20

## 2022-03-06 MED ORDER — INSULIN ASPART 100 UNIT/ML IJ SOLN
0.0000 [IU] | INTRAMUSCULAR | Status: DC | PRN
  Administered 2022-03-06: 10:00:00 8 [IU] via SUBCUTANEOUS

## 2022-03-06 MED ORDER — ALBUTEROL SULFATE (2.5 MG/3ML) 0.083% IN NEBU: 2.5000 mg | INHALATION_SOLUTION | Freq: Four times a day (QID) | RESPIRATORY_TRACT | Status: DC | PRN

## 2022-03-06 MED ORDER — TRANEXAMIC ACID-NACL 1000-0.7 MG/100ML-% IV SOLN
1000.0000 mg | INTRAVENOUS | Status: AC
  Administered 2022-03-06: 12:00:00 1000 mg via INTRAVENOUS
  Filled 2022-03-06: qty 100

## 2022-03-06 MED ORDER — OXYCODONE HCL 5 MG PO TABS
5.0000 mg | ORAL_TABLET | ORAL | Status: DC | PRN
  Administered 2022-03-06 – 2022-03-07 (×4): 10 mg via ORAL
  Filled 2022-03-06 (×4): qty 2

## 2022-03-06 MED ORDER — FENTANYL CITRATE (PF) 100 MCG/2ML IJ SOLN
INTRAMUSCULAR | Status: AC
  Filled 2022-03-06: qty 2

## 2022-03-06 MED ORDER — BUPIVACAINE HCL (PF) 0.5 % IJ SOLN
INTRAMUSCULAR | Status: DC | PRN
  Administered 2022-03-06: 15 mL via PERINEURAL

## 2022-03-06 MED ORDER — LOSARTAN POTASSIUM-HCTZ 100-25 MG PO TABS: 1.0000 | ORAL_TABLET | Freq: Every day | ORAL | Status: DC

## 2022-03-06 MED ORDER — CEFAZOLIN SODIUM-DEXTROSE 2-4 GM/100ML-% IV SOLN
2.0000 g | Freq: Three times a day (TID) | INTRAVENOUS | Status: DC
  Administered 2022-03-06 – 2022-03-07 (×2): 2 g via INTRAVENOUS
  Filled 2022-03-06 (×2): qty 100

## 2022-03-06 MED ORDER — ONDANSETRON HCL 4 MG/2ML IJ SOLN: 4.0000 mg | Freq: Four times a day (QID) | INTRAMUSCULAR | Status: DC | PRN

## 2022-03-06 MED ORDER — EPHEDRINE SULFATE-NACL 50-0.9 MG/10ML-% IV SOSY
PREFILLED_SYRINGE | INTRAVENOUS | Status: DC | PRN
  Administered 2022-03-06: 5 mg via INTRAVENOUS
  Administered 2022-03-06 (×2): 10 mg via INTRAVENOUS

## 2022-03-06 MED ORDER — HYDROMORPHONE HCL 1 MG/ML IJ SOLN
INTRAMUSCULAR | Status: AC
  Filled 2022-03-06: qty 1

## 2022-03-06 MED ORDER — ATORVASTATIN CALCIUM 80 MG PO TABS
80.0000 mg | ORAL_TABLET | Freq: Every day | ORAL | Status: DC
  Administered 2022-03-07: 80 mg via ORAL
  Filled 2022-03-06: qty 1

## 2022-03-06 MED ORDER — ACETAMINOPHEN 325 MG PO TABS: 325.0000 mg | ORAL_TABLET | Freq: Four times a day (QID) | ORAL | Status: DC | PRN

## 2022-03-06 MED ORDER — FLUTICASONE FUROATE-VILANTEROL 100-25 MCG/ACT IN AEPB
1.0000 | INHALATION_SPRAY | Freq: Every day | RESPIRATORY_TRACT | Status: DC
  Administered 2022-03-07: 1 via RESPIRATORY_TRACT
  Filled 2022-03-06: qty 28

## 2022-03-06 MED ORDER — PHENYLEPHRINE 40 MCG/ML (10ML) SYRINGE FOR IV PUSH (FOR BLOOD PRESSURE SUPPORT)
PREFILLED_SYRINGE | INTRAVENOUS | Status: DC | PRN
  Administered 2022-03-06 (×4): 80 ug via INTRAVENOUS
  Administered 2022-03-06: 40 ug via INTRAVENOUS
  Administered 2022-03-06: 160 ug via INTRAVENOUS
  Administered 2022-03-06: 120 ug via INTRAVENOUS
  Administered 2022-03-06 (×2): 40 ug via INTRAVENOUS

## 2022-03-06 MED ORDER — LOSARTAN POTASSIUM 50 MG PO TABS: 100.0000 mg | ORAL_TABLET | Freq: Every day | ORAL | Status: DC

## 2022-03-06 MED ORDER — FENTANYL CITRATE (PF) 250 MCG/5ML IJ SOLN
INTRAMUSCULAR | Status: DC | PRN
  Administered 2022-03-06 (×5): 50 ug via INTRAVENOUS

## 2022-03-06 MED ORDER — PROPOFOL 10 MG/ML IV BOLUS
INTRAVENOUS | Status: DC | PRN
  Administered 2022-03-06: 150 mg via INTRAVENOUS

## 2022-03-06 MED ORDER — HYDROMORPHONE HCL 1 MG/ML IJ SOLN
0.5000 mg | INTRAMUSCULAR | Status: DC | PRN
  Administered 2022-03-06: 17:00:00 0.5 mg via INTRAVENOUS

## 2022-03-06 MED ORDER — 0.9 % SODIUM CHLORIDE (POUR BTL) OPTIME
TOPICAL | Status: DC | PRN
  Administered 2022-03-06: 13:00:00 1000 mL
  Administered 2022-03-06: 15:00:00 3000 mL

## 2022-03-06 MED ORDER — LACTATED RINGERS IV SOLN: INTRAVENOUS | Status: DC

## 2022-03-06 MED ORDER — FENTANYL CITRATE (PF) 100 MCG/2ML IJ SOLN
25.0000 ug | INTRAMUSCULAR | Status: DC | PRN
  Administered 2022-03-06: 17:00:00 25 ug via INTRAVENOUS

## 2022-03-06 MED ORDER — MIDAZOLAM HCL 2 MG/2ML IJ SOLN
INTRAMUSCULAR | Status: AC
  Administered 2022-03-06: 11:00:00 2 mg via INTRAVENOUS
  Filled 2022-03-06: qty 2

## 2022-03-06 MED ORDER — CHLORHEXIDINE GLUCONATE 0.12 % MT SOLN
OROMUCOSAL | Status: AC
  Administered 2022-03-06: 09:00:00 15 mL via OROMUCOSAL
  Filled 2022-03-06: qty 15

## 2022-03-06 MED ORDER — ONDANSETRON HCL 4 MG PO TABS
4.0000 mg | ORAL_TABLET | Freq: Four times a day (QID) | ORAL | Status: DC | PRN
Start: 2022-03-06 — End: 2022-03-07

## 2022-03-06 MED ORDER — HYDROCHLOROTHIAZIDE 25 MG PO TABS: 25.0000 mg | ORAL_TABLET | Freq: Every day | ORAL | Status: DC

## 2022-03-06 MED ORDER — ONDANSETRON HCL 4 MG/2ML IJ SOLN
INTRAMUSCULAR | Status: DC | PRN
  Administered 2022-03-06: 4 mg via INTRAVENOUS

## 2022-03-06 MED ORDER — UMECLIDINIUM BROMIDE 62.5 MCG/ACT IN AEPB
1.0000 | INHALATION_SPRAY | Freq: Every day | RESPIRATORY_TRACT | Status: DC
Start: 2022-03-07 — End: 2022-03-07
  Administered 2022-03-07: 1 via RESPIRATORY_TRACT
  Filled 2022-03-06: qty 7

## 2022-03-06 MED ORDER — INSULIN ASPART 100 UNIT/ML IJ SOLN
4.0000 [IU] | Freq: Three times a day (TID) | INTRAMUSCULAR | Status: DC
  Administered 2022-03-07 (×2): 4 [IU] via SUBCUTANEOUS

## 2022-03-06 MED ORDER — DULOXETINE HCL 60 MG PO CPEP
60.0000 mg | ORAL_CAPSULE | Freq: Every day | ORAL | Status: DC
  Administered 2022-03-07: 60 mg via ORAL
  Filled 2022-03-06: qty 1

## 2022-03-06 MED ORDER — METOCLOPRAMIDE HCL 5 MG PO TABS: 5.0000 mg | ORAL_TABLET | Freq: Three times a day (TID) | ORAL | Status: DC | PRN

## 2022-03-06 MED ORDER — BUPIVACAINE LIPOSOME 1.3 % IJ SUSP
INTRAMUSCULAR | Status: DC | PRN
  Administered 2022-03-06: 10 mL via PERINEURAL

## 2022-03-06 MED ORDER — METOCLOPRAMIDE HCL 5 MG/ML IJ SOLN: 5.0000 mg | Freq: Three times a day (TID) | INTRAMUSCULAR | Status: DC | PRN

## 2022-03-06 MED ORDER — ROCURONIUM BROMIDE 10 MG/ML (PF) SYRINGE
PREFILLED_SYRINGE | INTRAVENOUS | Status: DC | PRN
  Administered 2022-03-06: 40 mg via INTRAVENOUS

## 2022-03-06 MED ORDER — GABAPENTIN 300 MG PO CAPS
300.0000 mg | ORAL_CAPSULE | Freq: Three times a day (TID) | ORAL | Status: DC
  Administered 2022-03-06 – 2022-03-07 (×2): 300 mg via ORAL
  Filled 2022-03-06 (×2): qty 1

## 2022-03-06 MED ORDER — AMLODIPINE-ATORVASTATIN 5-80 MG PO TABS: 1.0000 | ORAL_TABLET | Freq: Every day | ORAL | Status: DC

## 2022-03-06 MED ORDER — DOCUSATE SODIUM 100 MG PO CAPS
100.0000 mg | ORAL_CAPSULE | Freq: Two times a day (BID) | ORAL | Status: DC
  Administered 2022-03-06 – 2022-03-07 (×2): 100 mg via ORAL
  Filled 2022-03-06 (×2): qty 1

## 2022-03-06 MED ORDER — ORAL CARE MOUTH RINSE: 15.0000 mL | Freq: Once | OROMUCOSAL | Status: AC

## 2022-03-06 MED ORDER — SUGAMMADEX SODIUM 200 MG/2ML IV SOLN
INTRAVENOUS | Status: DC | PRN
  Administered 2022-03-06 (×2): 100 mg via INTRAVENOUS

## 2022-03-06 MED ORDER — CHLORHEXIDINE GLUCONATE 0.12 % MT SOLN: 15.0000 mL | Freq: Once | OROMUCOSAL | Status: AC

## 2022-03-06 MED ORDER — METHOCARBAMOL 500 MG PO TABS
500.0000 mg | ORAL_TABLET | Freq: Four times a day (QID) | ORAL | Status: DC | PRN
  Administered 2022-03-06 – 2022-03-07 (×3): 500 mg via ORAL
  Filled 2022-03-06 (×3): qty 1

## 2022-03-06 MED ORDER — AMLODIPINE BESYLATE 5 MG PO TABS
5.0000 mg | ORAL_TABLET | Freq: Every day | ORAL | Status: DC
  Administered 2022-03-07: 5 mg via ORAL
  Filled 2022-03-06: qty 1

## 2022-03-06 MED ORDER — METOPROLOL SUCCINATE ER 50 MG PO TB24: 50.0000 mg | ORAL_TABLET | Freq: Every day | ORAL | Status: DC

## 2022-03-06 MED ORDER — VANCOMYCIN HCL 1000 MG IV SOLR
INTRAVENOUS | Status: DC | PRN
  Administered 2022-03-06: 1000 mg

## 2022-03-06 MED ORDER — ACETAMINOPHEN 500 MG PO TABS
1000.0000 mg | ORAL_TABLET | Freq: Four times a day (QID) | ORAL | Status: AC
  Administered 2022-03-06 – 2022-03-07 (×4): 1000 mg via ORAL
  Filled 2022-03-06 (×4): qty 2

## 2022-03-06 MED ORDER — FENTANYL CITRATE (PF) 250 MCG/5ML IJ SOLN
INTRAMUSCULAR | Status: AC
  Filled 2022-03-06: qty 5

## 2022-03-06 MED ORDER — ASPIRIN EC 81 MG PO TBEC
81.0000 mg | DELAYED_RELEASE_TABLET | Freq: Every day | ORAL | Status: DC
  Administered 2022-03-06 – 2022-03-07 (×2): 81 mg via ORAL
  Filled 2022-03-06 (×2): qty 1

## 2022-03-06 MED ORDER — IRRISEPT - 450ML BOTTLE WITH 0.05% CHG IN STERILE WATER, USP 99.95% OPTIME
TOPICAL | Status: DC | PRN
  Administered 2022-03-06: 13:00:00 450 mL via TOPICAL

## 2022-03-06 MED ORDER — ACETAMINOPHEN 500 MG PO TABS
1000.0000 mg | ORAL_TABLET | Freq: Once | ORAL | Status: AC
  Administered 2022-03-06: 09:00:00 1000 mg via ORAL
  Filled 2022-03-06: qty 2

## 2022-03-06 MED ORDER — PHENYLEPHRINE HCL-NACL 20-0.9 MG/250ML-% IV SOLN
INTRAVENOUS | Status: DC | PRN
  Administered 2022-03-06: 25 ug/min via INTRAVENOUS

## 2022-03-06 MED ORDER — METHOCARBAMOL 1000 MG/10ML IJ SOLN
500.0000 mg | Freq: Four times a day (QID) | INTRAVENOUS | Status: DC | PRN
  Filled 2022-03-06 (×2): qty 5

## 2022-03-06 MED ORDER — INSULIN ASPART 100 UNIT/ML IJ SOLN
0.0000 [IU] | Freq: Three times a day (TID) | INTRAMUSCULAR | Status: DC
  Administered 2022-03-07: 3 [IU] via SUBCUTANEOUS
  Administered 2022-03-07: 5 [IU] via SUBCUTANEOUS

## 2022-03-06 MED ORDER — INSULIN ASPART 100 UNIT/ML IJ SOLN
0.0000 [IU] | Freq: Every day | INTRAMUSCULAR | Status: DC
  Administered 2022-03-06: 21:00:00 3 [IU] via SUBCUTANEOUS

## 2022-03-06 MED ORDER — POVIDONE-IODINE 7.5 % EX SOLN: Freq: Once | CUTANEOUS | Status: DC

## 2022-03-06 MED ORDER — CEFAZOLIN IN SODIUM CHLORIDE 3-0.9 GM/100ML-% IV SOLN
3.0000 g | INTRAVENOUS | Status: AC
  Administered 2022-03-06: 12:00:00 3 g via INTRAVENOUS
  Filled 2022-03-06: qty 100

## 2022-03-06 MED ORDER — BUSPIRONE HCL 5 MG PO TABS
5.0000 mg | ORAL_TABLET | Freq: Three times a day (TID) | ORAL | Status: DC
  Administered 2022-03-06 – 2022-03-07 (×2): 5 mg via ORAL
  Filled 2022-03-06 (×5): qty 1

## 2022-03-06 MED ORDER — SUCCINYLCHOLINE CHLORIDE 200 MG/10ML IV SOSY
PREFILLED_SYRINGE | INTRAVENOUS | Status: DC | PRN
  Administered 2022-03-06: 200 mg via INTRAVENOUS

## 2022-03-06 MED ORDER — CLOPIDOGREL BISULFATE 75 MG PO TABS
75.0000 mg | ORAL_TABLET | Freq: Every morning | ORAL | Status: DC
  Administered 2022-03-07: 75 mg via ORAL
  Filled 2022-03-06: qty 1

## 2022-03-06 MED ORDER — POVIDONE-IODINE 10 % EX SWAB
2.0000 "application " | Freq: Once | CUTANEOUS | Status: AC
  Administered 2022-03-06: 2 via TOPICAL

## 2022-03-06 MED ORDER — INSULIN ASPART 100 UNIT/ML IJ SOLN
INTRAMUSCULAR | Status: AC
  Filled 2022-03-06: qty 1

## 2022-03-06 MED ORDER — MENTHOL 3 MG MT LOZG: 1.0000 | LOZENGE | OROMUCOSAL | Status: DC | PRN

## 2022-03-06 SURGICAL SUPPLY — 88 items
ALCOHOL 70% 16 OZ (MISCELLANEOUS) ×2 IMPLANT
AUG COMP REV MI TAPER ADAPTER (Joint) ×2 IMPLANT
AUGMENT COMP REV MI TAPR ADPTR (Joint) IMPLANT
BAG COUNTER SPONGE SURGICOUNT (BAG) ×2 IMPLANT
BEARING CROSSLINK RSA 36 (Joint) ×1 IMPLANT
BENZOIN TINCTURE PRP APPL 2/3 (GAUZE/BANDAGES/DRESSINGS) ×1 IMPLANT
BIT DRILL 2.7 W/STOP DISP (BIT) ×1 IMPLANT
BIT DRILL QUICK REL 1/8 2PK SL (DRILL) IMPLANT
BIT DRILL TWIST 2.7 (BIT) ×1 IMPLANT
BLADE SAW SGTL 13X75X1.27 (BLADE) ×2 IMPLANT
CANISTER PREVENA PLUS 150 (CANNISTER) ×1 IMPLANT
CANISTER WOUNDNEG PRESSURE 500 (CANNISTER) ×1 IMPLANT
CHLORAPREP W/TINT 26 (MISCELLANEOUS) ×3 IMPLANT
CLSR STERI-STRIP ANTIMIC 1/2X4 (GAUZE/BANDAGES/DRESSINGS) ×1 IMPLANT
COOLER ICEMAN CLASSIC (MISCELLANEOUS) ×3 IMPLANT
COVER SURGICAL LIGHT HANDLE (MISCELLANEOUS) ×2 IMPLANT
DRAPE INCISE IOBAN 66X45 STRL (DRAPES) ×2 IMPLANT
DRAPE U-SHAPE 47X51 STRL (DRAPES) ×4 IMPLANT
DRILL QUICK RELEASE 1/8 INCH (DRILL) ×2
DRSG AQUACEL AG ADV 3.5X10 (GAUZE/BANDAGES/DRESSINGS) ×2 IMPLANT
ELECT BLADE 4.0 EZ CLEAN MEGAD (MISCELLANEOUS) ×2
ELECT REM PT RETURN 9FT ADLT (ELECTROSURGICAL) ×2
ELECTRODE BLDE 4.0 EZ CLN MEGD (MISCELLANEOUS) ×1 IMPLANT
ELECTRODE REM PT RTRN 9FT ADLT (ELECTROSURGICAL) ×1 IMPLANT
GAUZE SPONGE 4X4 12PLY STRL LF (GAUZE/BANDAGES/DRESSINGS) ×2 IMPLANT
GLENOID SPHERE STD STRL 36MM (Orthopedic Implant) ×1 IMPLANT
GLOVE SRG 8 PF TXTR STRL LF DI (GLOVE) ×1 IMPLANT
GLOVE SURG LTX SZ7 (GLOVE) ×2 IMPLANT
GLOVE SURG LTX SZ8 (GLOVE) ×2 IMPLANT
GLOVE SURG UNDER POLY LF SZ7 (GLOVE) ×2 IMPLANT
GLOVE SURG UNDER POLY LF SZ8 (GLOVE) ×2
GOWN STRL REUS W/ TWL LRG LVL3 (GOWN DISPOSABLE) ×1 IMPLANT
GOWN STRL REUS W/ TWL XL LVL3 (GOWN DISPOSABLE) ×1 IMPLANT
GOWN STRL REUS W/TWL LRG LVL3 (GOWN DISPOSABLE) ×2
GOWN STRL REUS W/TWL XL LVL3 (GOWN DISPOSABLE) ×2
GUIDE GLENOID SHLD MODEL LT (ORTHOPEDIC DISPOSABLE SUPPLIES) ×1 IMPLANT
GUIDE MODEL REV SHLD LT (ORTHOPEDIC DISPOSABLE SUPPLIES) ×1 IMPLANT
HYDROGEN PEROXIDE 16OZ (MISCELLANEOUS) ×2 IMPLANT
JET LAVAGE IRRISEPT WOUND (IRRIGATION / IRRIGATOR) ×2
KIT BASIN OR (CUSTOM PROCEDURE TRAY) ×2 IMPLANT
KIT TURNOVER KIT B (KITS) ×2 IMPLANT
LAVAGE JET IRRISEPT WOUND (IRRIGATION / IRRIGATOR) ×1 IMPLANT
LOOP VESSEL MAXI BLUE (MISCELLANEOUS) ×2 IMPLANT
MANIFOLD NEPTUNE II (INSTRUMENTS) ×2 IMPLANT
NDL SUT 6 .5 CRC .975X.05 MAYO (NEEDLE) IMPLANT
NDL TAPERED W/ NITINOL LOOP (MISCELLANEOUS) ×1 IMPLANT
NEEDLE MAYO TAPER (NEEDLE)
NEEDLE TAPERED W/ NITINOL LOOP (MISCELLANEOUS) ×2 IMPLANT
NS IRRIG 1000ML POUR BTL (IV SOLUTION) ×2 IMPLANT
PACK SHOULDER (CUSTOM PROCEDURE TRAY) ×2 IMPLANT
PAD ARMBOARD 7.5X6 YLW CONV (MISCELLANEOUS) ×4 IMPLANT
PAD COLD SHLDR UNI WRAP-ON (PAD) ×2
PAD COLD SHLDR WRAP-ON (PAD) ×2 IMPLANT
PAD COLD UNI WRAP-ON (PAD) IMPLANT
PASSER SUT SWANSON 36MM LOOP (INSTRUMENTS) ×2 IMPLANT
PIN THREADED REVERSE (PIN) ×2 IMPLANT
REAMER GUIDE BUSHING SURG DISP (MISCELLANEOUS) ×1 IMPLANT
REAMER GUIDE W/SCREW AUG (MISCELLANEOUS) ×1 IMPLANT
RESTRAINT HEAD UNIVERSAL NS (MISCELLANEOUS) ×2 IMPLANT
SCREW BONE STRL 6.5MMX25MM (Screw) ×1 IMPLANT
SCREW LOCKING 4.75MMX15MM (Screw) ×2 IMPLANT
SCREW LOCKING STRL 4.75X25X3.5 (Screw) ×2 IMPLANT
SLING ARM IMMOBILIZER LRG (SOFTGOODS) ×2 IMPLANT
SLING ARM IMMOBILIZER XL (CAST SUPPLIES) ×1 IMPLANT
SOL PREP POV-IOD 4OZ 10% (MISCELLANEOUS) ×2 IMPLANT
SPONGE T-LAP 18X18 ~~LOC~~+RFID (SPONGE) ×4 IMPLANT
STEM SHOULDER (Stem) ×1 IMPLANT
STRIP CLOSURE SKIN 1/2X4 (GAUZE/BANDAGES/DRESSINGS) ×2 IMPLANT
SUCTION FRAZIER HANDLE 10FR (MISCELLANEOUS) ×2
SUCTION TUBE FRAZIER 10FR DISP (MISCELLANEOUS) ×1 IMPLANT
SUT BROADBAND TAPE 2PK 1.5 (SUTURE) ×3 IMPLANT
SUT FIBERWIRE #2 38 T-5 BLUE (SUTURE)
SUT MAXBRAID (SUTURE) IMPLANT
SUT MNCRL AB 3-0 PS2 18 (SUTURE) ×2 IMPLANT
SUT SILK 2 0 TIES 10X30 (SUTURE) ×2 IMPLANT
SUT VIC AB 0 CT1 27 (SUTURE) ×8
SUT VIC AB 0 CT1 27XBRD ANBCTR (SUTURE) ×4 IMPLANT
SUT VIC AB 1 CT1 27 (SUTURE) ×4
SUT VIC AB 1 CT1 27XBRD ANBCTR (SUTURE) ×2 IMPLANT
SUT VIC AB 2-0 CT1 27 (SUTURE) ×6
SUT VIC AB 2-0 CT1 TAPERPNT 27 (SUTURE) ×3 IMPLANT
SUT VICRYL 0 UR6 27IN ABS (SUTURE) ×4 IMPLANT
SUTURE FIBERWR #2 38 T-5 BLUE (SUTURE) IMPLANT
TOWEL GREEN STERILE (TOWEL DISPOSABLE) ×2 IMPLANT
TRAY FOL W/BAG SLVR 16FR STRL (SET/KITS/TRAYS/PACK) IMPLANT
TRAY FOLEY W/BAG SLVR 16FR LF (SET/KITS/TRAYS/PACK)
TRAY HUM REV SHOULDER STD +6 (Shoulder) ×1 IMPLANT
WATER STERILE IRR 1000ML POUR (IV SOLUTION) ×2 IMPLANT

## 2022-03-06 NOTE — Anesthesia Procedure Notes (Signed)
Procedure Name: Intubation ?Date/Time: 03/06/2022 12:22 PM ?Performed by: Annamary Carolin, CRNA ?Pre-anesthesia Checklist: Patient identified, Emergency Drugs available, Suction available and Patient being monitored ?Patient Re-evaluated:Patient Re-evaluated prior to induction ?Oxygen Delivery Method: Circle System Utilized ?Preoxygenation: Pre-oxygenation with 100% oxygen ?Induction Type: IV induction ?Ventilation: Mask ventilation without difficulty ?Laryngoscope Size: 3 and Mac ?Tube type: Oral ?Tube size: 7.0 mm ?Number of attempts: 1 ?Airway Equipment and Method: Stylet ?Placement Confirmation: ETT inserted through vocal cords under direct vision, positive ETCO2 and breath sounds checked- equal and bilateral ?Secured at: 22 cm ?Tube secured with: Tape ?Dental Injury: Teeth and Oropharynx as per pre-operative assessment  ?Comments: easy ? ? ? ? ?

## 2022-03-06 NOTE — H&P (Signed)
[name] is an 61 y.o. female.   Chief Complaint: left shoulder pain HPI: [name] is a 61 year old patient with neck and shoulder pain.  She had a CT scan of the left shoulder in October and MRI scan in July.  Also had MRI scan of the cervical spine done in August.  Cervical spine MRI shows moderate canal stenosis at C6-7.  CT of the left shoulder demonstrates advanced arthritis of the left glenohumeral joint with no gross rotator cuff muscular atrophy or impingement.  CT scan is reviewed with [name] showing the severe arthritis which is present.  Patient describes left shoulder pain radiating into her neck.  Pain started about a year ago.  Gradually getting worse.  Denies any injury or fall.  Injection into the neck has been denied twice.  She is considering surgery for the neck.  She reports tingling and numbness radiating down into both arms.  Tramadol is being used for pain.  She had a glenohumeral joint injection which gave her marginal relief of her overall pain in that left forequarter.  Diabetes is present but well controlled.  She does have a heart valve and is on Plavix for that.  Her husband had a reverse shoulder replacement.  He did reasonably well. Patient has been seen by Dr. Louanne Skye and he recommended cervical traction and McKenzie exercises.  Hemoglobin A1c 8.  Past Medical History:  Diagnosis Date   Anemia    Anxiety    Aortic valve stenosis, severe    Arthritis    PAIN AND SEVERE OA LEFT KNEE ; S/P RIGHT TKA ON 02/03/12; HAS LOWER BACK PAIN-UNABLE TO STAND MORE THAN 10 MIN; ARTHRITIS "ALL OVER"   Asthma    Blood transfusion    [name]   Breast cancer in female [facility])    Right   CAD (coronary artery disease)    Cath 2010 with DES x 1 RCA-- PT'S CARDIOLOGIST IS DR. MCALHANY   Chronic diastolic congestive heart failure (HCC)    COPD (chronic obstructive pulmonary disease) (HCC)    Depression    Diabetes mellitus DIAGNOSED IN2010   Dyspnea    with much ambulation    Eczema    on back   Headache    migraines younger- rare now 02/07/21   Heart murmur    no current problems   History of hiatal hernia    History of kidney stones    passed or blasted   Hyperlipidemia    Hypertension    Morbid obesity with body mass index of 60.0-69.9 in adult [facility])    Myocardial infarction ([place])    PT THINKS SHE WAS DX WITH MI AT THE TIME OF HEART STENTING   Neuromuscular disorder (HCC)    bilateral arm/hands   Oxygen dependent    uses 3L oxygen night/prn   Personal history of radiation therapy    Pneumonia    Pulmonary embolism ([place]) 02/08/2012   S/P RT TOTAL KNEE ON 02/03/12--ON 02/08/12--DEVELOPED ACUTE SOB AND CHEST PAIN--AND DIAGNOSED WITH  PULMONARY EMBOLUS AND PNEUMONIA   Restless leg syndrome    Sleep apnea    uses 3 liters O2 at night as needed   Uterine fibroid    NO PROBLEMS AT PRESENT FROM THE FIBROIDS-STATES SHE IS POST MENOPAUSAL-LAST MENSES 2010 EXCEPT FOR EPISODE THIS YR OF BLEEDING RELATED TO FIBROIDS.   Weakness    BOTH HANDS - S/P BILATERAL CARPAL TUNNEL RELEASE--BUT STILL HAS WEAKNESS--OFTEN DROPS THINGS    Past Surgical History:  Procedure Laterality Date   BACK SURGERY  02/11/2021   Dr Louanne Skye   BREAST BIOPSY Right 06/04/2018   BREAST LUMPECTOMY Right 06/2018   BREAST LUMPECTOMY WITH RADIOACTIVE SEED AND SENTINEL LYMPH NODE BIOPSY Right 07/19/2018   Procedure: BREAST LUMPECTOMY WITH RADIOACTIVE SEED AND SENTINEL LYMPH NODE BIOPSY;  Surgeon: Alphonsa Overall, MD;  Location: [place];  Service: General;  Laterality: Right;   CARDIAC CATHETERIZATION     CARDIAC VALVE REPLACEMENT     2017   CARPAL TUNNEL RELEASE     Bilateral   CHOLECYSTECTOMY     COLONOSCOPY     CORONARY ANGIOPLASTY     2010 has stent in place   CYSTOSCOPY W/ RETROGRADES Right 09/21/2013   Procedure: CYSTOSCOPY WITH RIGHT RETROGRADE PYELOGRAM RIGHT DOUBLE J STENT ;  Surgeon: Fredricka Bonine, MD;  Location: [facility];  Service: Urology;  Laterality: Right;   CYSTOSCOPY  WITH URETEROSCOPY AND STENT PLACEMENT Right 10/25/2013   Procedure: CYSTOSCOPY RIGHT URETEROSCOPY HOLMIUM LASER LITHO AND STENT PLACEMENT;  Surgeon: Fredricka Bonine, MD;  Location: [facility];  Service: Urology;  Laterality: Right;   HERNIA REPAIR     INTRAOPERATIVE TRANSESOPHAGEAL ECHOCARDIOGRAM N/A 12/12/2014   Procedure: INTRAOPERATIVE TRANSESOPHAGEAL ECHOCARDIOGRAM;  Surgeon: Burnell Blanks, MD;  Location: [place];  Service: Cardiovascular;  Laterality: N/A;   JOINT REPLACEMENT     bil total knees   KNEE ARTHROPLASTY  02/03/2012   Procedure: COMPUTER ASSISTED TOTAL KNEE ARTHROPLASTY;  Surgeon: Mcarthur Rossetti, MD;  Location: [place];  Service: Orthopedics;  Laterality: Right;  Right total knee arthroplasty   LEFT AND RIGHT HEART CATHETERIZATION WITH CORONARY ANGIOGRAM N/A 03/17/2013   Procedure: LEFT AND RIGHT HEART CATHETERIZATION WITH CORONARY ANGIOGRAM;  Surgeon: Burnell Blanks, MD;  Location: [facility] CATH LAB;  Service: Cardiovascular;  Laterality: N/A;   LEFT AND RIGHT HEART CATHETERIZATION WITH CORONARY/GRAFT ANGIOGRAM N/A 09/14/2014   Procedure: LEFT AND RIGHT HEART CATHETERIZATION WITH Beatrix Fetters;  Surgeon: Burnell Blanks, MD;  Location: [facility] CATH LAB;  Service: Cardiovascular;  Laterality: N/A;   TEE WITHOUT CARDIOVERSION N/A 03/14/2013   Procedure: TRANSESOPHAGEAL ECHOCARDIOGRAM (TEE);  Surgeon: Lelon Perla, MD;  Location: [facility] ENDOSCOPY;  Service: Cardiovascular;  Laterality: N/A;   TEE WITHOUT CARDIOVERSION N/A 11/14/2014   Procedure: TRANSESOPHAGEAL ECHOCARDIOGRAM (TEE);  Surgeon: Lelon Perla, MD;  Location: [facility] ENDOSCOPY;  Service: Cardiovascular;  Laterality: N/A;   TONSILLECTOMY     maybe as a child- does not know   TOTAL KNEE ARTHROPLASTY  09/10/2012   Procedure: TOTAL KNEE ARTHROPLASTY;  Surgeon: Mcarthur Rossetti, MD;  Location[facility];  Service: Orthopedics;  Laterality: Left;   TOTAL KNEE REVISION Right 07/15/2013    Procedure: REVISION ARTHROPLASTY RIGHT KNEE;  Surgeon: Mcarthur Rossetti, MD;  Location: [facility];  Service: Orthopedics;  Laterality: Right;   TRANSCATHETER AORTIC VALVE REPLACEMENT, TRANSFEMORAL N/A 12/12/2014   Procedure: TRANSCATHETER AORTIC VALVE REPLACEMENT, TRANSFEMORAL;  Surgeon: Burnell Blanks, MD;  Location: [place];  Service: Cardiovascular;  Laterality: N/A;   TRIGGER FINGER RELEASE  09/10/2012   Procedure: RELEASE TRIGGER FINGER/A-1 PULLEY;  Surgeon: Mcarthur Rossetti, MD;  Location: [facility];  Service: Orthopedics;  Laterality: Right;  Right Ring Finger   TUBAL LIGATION      Family History  Problem Relation Age of Onset   Breast cancer Mother        stage IV at diagnosis   Emphysema Mother        smoked   Heart disease  Mother    COPD Father        smoked   Asthma Father    Heart disease Father    Cancer Brother        Sinus   Social History:  reports that she quit smoking about 21 years ago. Her smoking use included cigarettes. She has a 45.00 pack-year smoking history. She has never used smokeless tobacco. She reports that she does not currently use alcohol. She reports that she does not currently use drugs.  Allergies: No Known Allergies  Medications Prior to Admission  Medication Sig Dispense Refill   acetaminophen (TYLENOL) 500 MG tablet Take 1,000 mg by mouth every 8 (eight) hours as needed for headache.     albuterol (VENTOLIN HFA) 108 (90 Base) MCG/ACT inhaler Inhale 2 puffs into the lungs every 6 (six) hours as needed for wheezing or shortness of breath. 18 g 3   amlodipine-atorvastatin (CADUET) 5-80 MG tablet Take 1 tablet by mouth daily. 90 tablet 3   Ascorbic Acid (VITAMIN C) 1000 MG tablet Take 1,000 mg by mouth daily.     ASPIRIN ADULT LOW STRENGTH 81 MG EC tablet TAKE 1 Tablet BY MOUTH ONCE DAILY 90 tablet 3   busPIRone (BUSPAR) 5 MG tablet TAKE 1 TABLET BY MOUTH THREE TIMES DAILY (Patient taking differently: 10 mg.) 90 tablet 3   clopidogrel  (PLAVIX) 75 MG tablet TAKE 1 TABLET BY MOUTH IN THE MORNING 90 tablet 3   DULoxetine (CYMBALTA) 60 MG capsule Take 1 capsule by mouth once daily 90 capsule 3   Evolocumab (REPATHA SURECLICK) 563 MG/ML SOAJ INJECT 1 PEN INTO THE SKIN EVERY 14 DAYS 2 mL 11   exemestane (AROMASIN) 25 MG tablet Take 1 tablet (25 mg total) by mouth daily after breakfast. 90 tablet 3   ezetimibe (ZETIA) 10 MG tablet Take 1 tablet (10 mg total) by mouth daily. 90 tablet 3   Fluticasone-Umeclidin-Vilant (TRELEGY ELLIPTA) 100-62.5-25 MCG/ACT AEPB INHALE 1 PUFF ONCE DAILY Strength: 100-62.5-25 MCG/ACT 180 each 5   furosemide (LASIX) 40 MG tablet Take 1 tablet (40 mg total) by mouth as needed for fluid or edema. 180 tablet 1   gabapentin (NEURONTIN) 300 MG capsule Take 3 capsules po at bedtime and 1 capsule po in the AM 120 capsule 4   liraglutide (VICTOZA) 18 MG/3ML SOPN INJECT 1.8 MG INTO THE SKIN ONCE DAILY Strength: 18 MG/3ML 18 mL 3   losartan-hydrochlorothiazide (HYZAAR) 100-25 MG tablet Take 1 tablet by mouth daily. 90 tablet 3   meloxicam (MOBIC) 15 MG tablet Take 15 mg by mouth daily as needed for pain.     metoprolol succinate (TOPROL XL) 50 MG 24 hr tablet Take 1 tablet (50 mg total) by mouth daily. Take with or immediately following a meal. 90 tablet 3   Potassium 99 MG TABS Take 99 mg by mouth daily.     traMADol (ULTRAM) 50 MG tablet TAKE 1 TABLET BY MOUTH EVERY 8 HOURS AS NEEDED 30 tablet 0   Accu-Chek FastClix Lancets MISC Check blood sugar two times a day 102 each 9   Blood Glucose Monitoring Suppl (ACCU-CHEK GUIDE) w/Device KIT 1 each by Does not apply route 2 (two) times daily. 1 kit 1   docusate sodium (COLACE) 100 MG capsule Take 1 capsule (100 mg total) by mouth 2 (two) times daily. 40 capsule 0   ferrous gluconate (FERGON) 324 MG tablet Take 1 tablet (324 mg total) by mouth 2 (two) times daily with a meal. 60  tablet 1   glucose blood (ACCU-CHEK GUIDE) test strip Check blood sugar 2 times per day 100  each 9   Insulin Pen Needle 30G X 5 MM MISC 1 application by Does not apply route daily. 100 each 1   nitroGLYCERIN (NITROSTAT) 0.4 MG SL tablet Place 1 tablet (0.4 mg total) under the tongue every 5 (five) minutes as needed for chest pain. 100 tablet 0    Results for orders placed or performed during the hospital encounter of 03/06/22 (from the past 48 hour(s))  Glucose, capillary     Status: Abnormal   Collection Time: 03/06/22  9:00 AM  Result Value Ref Range   Glucose-Capillary 274 (H) 70 - 99 mg/dL    Comment: Glucose reference range applies only to samples taken after fasting for at least 8 hours.   No results found.  Review of Systems  Musculoskeletal:  Positive for arthralgias.  All other systems reviewed and are negative.  Blood pressure 117/62, pulse 95, temperature 97.7 F (36.5 C), temperature source Oral, resp. rate 20, height _0  (1.6 m), weight (!) 145.9 kg, last menstrual period 02/03/2012, SpO2 97 %. Physical Exam Vitals reviewed.  HENT:     Head: Normocephalic.     Nose: Nose normal.     Mouth/Throat:     Mouth: Mucous membranes are moist.  Eyes:     Pupils: Pupils are equal, round, and reactive to light.  Cardiovascular:     Rate and Rhythm: Normal rate.     Pulses: Normal pulses.  Pulmonary:     Effort: Pulmonary effort is normal.  Abdominal:     General: Abdomen is flat.  Musculoskeletal:     Cervical back: Normal range of motion.  Skin:    General: Skin is warm.     Capillary Refill: Capillary refill takes less than 2 seconds.  Neurological:     General: No focal deficit present.     Mental Status: She is alert.  Psychiatric:        Mood and Affect: Mood normal.  Left shoulder exam demonstrates reasonable passive range of motion of the shoulder with 30 degrees of external rotation, 90 degrees of abduction, 100 degrees of forward flexion.  Axillary nerve intact with deltoid firing.  Good subscap strength.  Supraspinatus and infraspinatus strength  feels symmetric to the right-hand side.  Patient has severe pain with terminal range of motion of the left shoulder.  No muscle atrophy left arm versus right  Assessment/Plan Impression is left shoulder arthritis and left-sided radiculopathy.  I think the majority of her pain is likely coming from the shoulder.  Plan at this time is left shoulder replacement.  Based on tissue quality we will go with convertible glenoid versus reverse replacement.  The risk and benefits are discussed including not limited to infection nerve vessel damage instability as well as incomplete pain relief and incomplete restoration of function.  Patient understands the risk and benefits and wishes to proceed.  All questions answered.  Plan to use a Prevena wound VAC as well as vancomycin powder Intra-Op.  Anderson Malta, MD 03/06/2022, 11:28 AM

## 2022-03-06 NOTE — Brief Op Note (Signed)
? ? ?  03/06/2022 ? ?4:36 PM ? ?PATIENT:  [name]  61 y.o. female ? ?PRE-OPERATIVE DIAGNOSIS:  left shouder osteoarthritis ? ?POST-OPERATIVE DIAGNOSIS:  left shouder osteoarthritis ? ?PROCEDURE:  Procedure(s): ?LEFT SHOULDER REPLACEMENT APPLICATION OF WOUND VAC ? ?SURGEON:  Surgeon(s): ?[name], MD ? ?ASSISTANT: magnant ? ?ANESTHESIA:   general ? ?EBL: 175 ml   ? ?Total I/O ?In: 1100 [I.V.:1000; IV Piggyback:100] ?Out: 250 [Blood:250] ? ?BLOOD ADMINISTERED: none ? ?DRAINS: none  ? ?LOCAL MEDICATIONS USED:  vanco ? ?SPECIMEN:  No Specimen ? ?COUNTS:  YES ? ?TOURNIQUET:  * No tourniquets in log * ? ?DICTATION: .Other Dictation: Dictation Number [id number] ? ?PLAN OF CARE: Admit for overnight observation ? ?PATIENT DISPOSITION:  PACU - hemodynamically stable ? ? ? ? ? ? ? ? ? ? ? ? ?  ?

## 2022-03-06 NOTE — Op Note (Signed)
NAME: [name], [name] ?MEDICAL RECORD NO: [id number] ?ACCOUNT NO: [id number] ?DATE OF BIRTH: [birth year] ?FACILITY: [facility] ?LOCATION: [facility] ?PHYSICIAN: Yetta Barre. Marlou Sa, MD ? ?Operative Report  ? ?DATE OF PROCEDURE: 03/06/2022 ? ? ?PREOPERATIVE DIAGNOSIS:  Left shoulder arthritis. ? ?POSTOPERATIVE DIAGNOSIS:  Left shoulder arthritis. ? ?PROCEDURE:  Left reverse shoulder replacement using [employer] mini humeral stem size 10, mini humeral tray, standard thickness +6 offset, 40 mm in diameter with 36 standard bearing, high cross-linked polyethylene with small augmented baseplate, 36  ?standard glenosphere, 1 central compression screw, 4 peripheral locking screws. ? ?SURGEON:  Yetta Barre. Marlou Sa, MD. ? ?ASSISTANT:  [name], PA. ? ?INDICATIONS:  [name] is a 61 year old patient with left shoulder arthritis, who presents for operative management after explanation of risks and benefits. ? ?DESCRIPTION OF PROCEDURE:  The patient was brought to the operating room where general anesthetic was induced.  Preoperative antibiotics administered.  Timeout was called.  The patient was placed in the beach chair position with the head in neutral  ?position.  All bony prominences were well padded.  Left shoulder, arm and hand prescrubbed with hydrogen peroxide first then alcohol and Betadine, which was allowed to air dry, then ChloraPrep solution and then draped in a sterile manner.  Charlie Pitter was used ? to cover and seal the operative field.  Timeout was called.  Deltopectoral approach was made.  Skin and subcutaneous tissue sharply divided.  IrriSept solution used at this time as well as at the time of the arthrotomy and at multiple times during the  ?case.  Cephalic vein mobilized laterally.  Crossing veins ligated.  Kolbel retractor placed.  Biceps tendon tenodesed to the pec tendon, which had to be released about 1.5 cm to facilitate exposure.  Significant fluid was in the joint along with multiple ? loose bodies.  After  tenodesing the biceps tendon, the rotator interval was opened up to the base of the coracoid.  Next, the axillary nerve was palpated, visualized and protected at all times during the case.  Circumflex vessels were ligated.  Subscap  ?was then peeled down off the lesser tuberosity using a 15 blade.  Dissection was taken around to the 7 o'clock position on the humeral head after elevating about 2 cm of capsule from the inferior humeral neck.  Large osteophyte also required removal.   ?Rotator cuff was intact, but the subscap had a lot of tendinosis.  Decision was made to proceed with reverse replacement based on tenuous subscap tissue quality.  The subscap was mobilized circumferentially.  Next, reaming was performed up to size 10 on  ?the humerus and then broaching performed up to size 10 with very good press fit obtained.  Cap was placed.  Attention was then directed towards the glenoid.  Posterior retractor was placed.  The labrum was then circumferentially excised 360 degrees.   ?Bankart lesion created from the 12 o'clock to 6 o'clock position.  Then, using the patient-specific instrumentation in guide, the guide pin was placed and slight inferior tilt on to the glenoid.  The glenoid was then reamed with excellent bone quality  ?encountered.  Next, reaming for the augment was performed and a trial baseplate was placed.  This achieved 100% contact in accordance with preoperative templating.  Irrigation with the IrriSept solution was performed and then the glenoid baseplate was  ?placed with one central compression screw and 4 peripheral locking screws placed, which gave excellent fixation.  Spacing for the glenosphere was standard size only.  This was placed.  Next, reduction was performed with the standard baseplate and +3  ?polyethylene.  This was slightly tight.  Trial components were removed on the humeral side, offset on the glenosphere was placed inferiorly.  Next, the true stem was placed after the head was  cut previously in about 30 degrees of retroversion, which  ?matched the patient's native version.  Six suture tapes were placed prior to placing the stem.  Stem was placed and reduction was again performed with the +6 offset tray and standard liner, which gave very good stability to abduction and extension along  ?with internal and external rotation.  Two fingers tight for redislocation.  True components placed.  In terms of the tray thorough irrigation was performed.  The subscap was then repaired with the arm in 30 degrees of external rotation using the 6 suture ? tapes.  Thorough irrigation was then again performed with IrriSept solution in the joint.  Vancomycin powder placed in the joint and the rotator interval was closed with #1 Vicryl suture with the arm in external rotation.  Next axillary nerve was  ?palpated and intact.  Thorough irrigation was then performed in the deltopectoral interval.  IrriSept solution utilized followed by vancomycin powder.  Deltopectoral interval was then closed using #1 Vicryl suture followed by interrupted inverted 0  ?Vicryl suture, 2-0 Vicryl suture, and 3-0 Monocryl.  Prevena wound VAC was applied.  The patient tolerated the procedure well without immediate complications, placed in a sling.  Luke's assistance was required for retraction, opening, closing,  ?mobilization of tissue.  His assistance was a medical necessity.  ? ? ? ?[place] ?D: 03/06/2022 4:37:08 pm T: 03/06/2022 10:57:00 pm  ?JOB: [id number]/ [id number]  ?

## 2022-03-06 NOTE — Transfer of Care (Signed)
Immediate Anesthesia Transfer of Care Note ? ?Patient: [name] ? ?Procedure(s) Performed: LEFT SHOULDER REPLACEMENT APPLICATION OF WOUND VAC (Left: Shoulder) ? ?Patient Location: PACU ? ?Anesthesia Type:General and Regional ? ?Level of Consciousness: awake, alert , drowsy and patient cooperative ? ?Airway & Oxygen Therapy: Patient Spontanous Breathing and Patient connected to face mask oxygen ? ?Post-op Assessment: Report given to RN, Post -op Vital signs reviewed and stable and Patient moving all extremities ? ?Post vital signs: Reviewed and stable ? ?Last Vitals:  ?Vitals Value Taken Time  ?BP 139/51 03/06/22 1616  ?Temp    ?Pulse 97 03/06/22 1621  ?Resp 19 03/06/22 1621  ?SpO2 94 % 03/06/22 1621  ?Vitals shown include unvalidated device data. BS 173. ? ?Last Pain:  ?Vitals:  ? 03/06/22 0912  ?TempSrc: Oral  ?PainSc: 1   ?   ? ?  ? ?Complications: No notable events documented. ?

## 2022-03-06 NOTE — Anesthesia Procedure Notes (Signed)
Anesthesia Regional Block: Interscalene brachial plexus block  ? ?Pre-Anesthetic Checklist: , timeout performed,  Correct Patient, Correct Site, Correct Laterality,  Correct Procedure, Correct Position, site marked,  Risks and benefits discussed,  Surgical consent,  Pre-op evaluation,  At surgeon's request and post-op pain management ? ?Laterality: Left ? ?Prep: Maximum Sterile Barrier Precautions used, chloraprep     ?  ?Needles:  ?Injection technique: Single-shot ? ?Needle Type: Echogenic Stimulator Needle   ? ? ?Needle Length: 9cm  ?Needle Gauge: 22  ? ? ? ?Additional Needles: ? ? ?Procedures:,,,, ultrasound used (permanent image in chart),,    ?Narrative:  ?Start time: 03/06/2022 11:55 AM ?End time: 03/06/2022 11:59 AM ?Injection made incrementally with aspirations every 5 mL. ? ?Performed by: Personally  ?Anesthesiologist: Freddrick March, MD ? ?Additional Notes: ?Monitors applied. No increased pain on injection. No increased resistance to injection. Injection made in 5cc increments. Good needle visualization. Patient tolerated procedure well.  ? ? ? ? ?

## 2022-03-07 ENCOUNTER — Encounter (HOSPITAL_COMMUNITY): Payer: Self-pay | Admitting: Orthopedic Surgery

## 2022-03-07 DIAGNOSIS — Z87891 Personal history of nicotine dependence: Secondary | ICD-10-CM | POA: Diagnosis not present

## 2022-03-07 DIAGNOSIS — I5032 Chronic diastolic (congestive) heart failure: Secondary | ICD-10-CM | POA: Diagnosis not present

## 2022-03-07 DIAGNOSIS — Z96653 Presence of artificial knee joint, bilateral: Secondary | ICD-10-CM | POA: Diagnosis not present

## 2022-03-07 DIAGNOSIS — Z79899 Other long term (current) drug therapy: Secondary | ICD-10-CM | POA: Diagnosis not present

## 2022-03-07 DIAGNOSIS — I2510 Atherosclerotic heart disease of native coronary artery without angina pectoris: Secondary | ICD-10-CM | POA: Diagnosis not present

## 2022-03-07 DIAGNOSIS — Z86711 Personal history of pulmonary embolism: Secondary | ICD-10-CM | POA: Diagnosis not present

## 2022-03-07 DIAGNOSIS — Z853 Personal history of malignant neoplasm of breast: Secondary | ICD-10-CM | POA: Diagnosis not present

## 2022-03-07 DIAGNOSIS — M19112 Post-traumatic osteoarthritis, left shoulder: Secondary | ICD-10-CM | POA: Diagnosis not present

## 2022-03-07 DIAGNOSIS — I110 Hypertensive heart disease with heart failure: Secondary | ICD-10-CM | POA: Diagnosis not present

## 2022-03-07 DIAGNOSIS — Z955 Presence of coronary angioplasty implant and graft: Secondary | ICD-10-CM | POA: Diagnosis not present

## 2022-03-07 DIAGNOSIS — J449 Chronic obstructive pulmonary disease, unspecified: Secondary | ICD-10-CM | POA: Diagnosis not present

## 2022-03-07 DIAGNOSIS — M19012 Primary osteoarthritis, left shoulder: Secondary | ICD-10-CM | POA: Diagnosis not present

## 2022-03-07 DIAGNOSIS — J45909 Unspecified asthma, uncomplicated: Secondary | ICD-10-CM | POA: Diagnosis not present

## 2022-03-07 DIAGNOSIS — Z96612 Presence of left artificial shoulder joint: Secondary | ICD-10-CM | POA: Diagnosis not present

## 2022-03-07 DIAGNOSIS — E119 Type 2 diabetes mellitus without complications: Secondary | ICD-10-CM | POA: Diagnosis not present

## 2022-03-07 LAB — GLUCOSE, CAPILLARY
Glucose-Capillary: 207 mg/dL — ABNORMAL HIGH (ref 70–99)
Glucose-Capillary: 158 mg/dL — ABNORMAL HIGH (ref 70–99)

## 2022-03-07 LAB — CBC
HCT: 36 % (ref 36.0–46.0)
Hemoglobin: 12.1 g/dL (ref 12.0–15.0)
MCH: 29.4 pg (ref 26.0–34.0)
MCHC: 33.6 g/dL (ref 30.0–36.0)
MCV: 87.4 fL (ref 80.0–100.0)
Platelets: 187 10*3/uL (ref 150–400)
RBC: 4.12 MIL/uL (ref 3.87–5.11)
RDW: 12.8 % (ref 11.5–15.5)
WBC: 10 10*3/uL (ref 4.0–10.5)
nRBC: 0 % (ref 0.0–0.2)

## 2022-03-07 LAB — BASIC METABOLIC PANEL
Anion gap: 11 (ref 5–15)
BUN: 13 mg/dL (ref 8–23)
CO2: 27 mmol/L (ref 22–32)
Calcium: 8.8 mg/dL — ABNORMAL LOW (ref 8.9–10.3)
Chloride: 97 mmol/L — ABNORMAL LOW (ref 98–111)
Creatinine, Ser: 0.91 mg/dL (ref 0.44–1.00)
GFR, Estimated: >60 mL/min (ref >60)
Glucose, Bld: 205 mg/dL — ABNORMAL HIGH (ref 70–99)
Potassium: 4.1 mmol/L (ref 3.5–5.1)
Sodium: 135 mmol/L (ref 135–145)

## 2022-03-07 MED ORDER — HYDROCODONE-ACETAMINOPHEN 5-325 MG PO TABS: 1.0000 | ORAL_TABLET | Freq: Four times a day (QID) | ORAL | 0 refills | Status: DC | PRN

## 2022-03-07 MED ORDER — METHOCARBAMOL 500 MG PO TABS: 500.0000 mg | ORAL_TABLET | Freq: Four times a day (QID) | ORAL | 0 refills | Status: DC | PRN

## 2022-03-07 NOTE — Evaluation (Signed)
Physical Therapy Evaluation ?Patient Details ?Name: [name] ?MRN: [id number] ?DOB: 1961-10-26 ?Today's Date: 03/07/2022 ? ?History of Present Illness ? This 61 y.o. female admitted for Lt RSA due to Lt shoulder arthritis.  PMH includes: anxiety, severe aortic valve stenosis, asthma, h/o breast CA, CAD, chronic diastolic HF, COPD, Depression, DM, HTN, morbid obesity, MI, neuromuscular disorder, PE, s/p back surgery,  ?Clinical Impression ? Patient admitted following above procedure. Patient presents with generalized weakness, impaired balance, and decreased activity tolerance. OT provided education on shoulder precautions and safety, patient demonstrated good understanding with mobility. Patient required min guard for safety during 30' of ambulation with use of SPC with 3/4 DOE requiring standing rest break after 20'. Encouraged continued mobility in the home to improve activity tolerance. Patient will benefit from skilled PT services during acute stay to address listed deficits. No PT follow up recommended at this time.  ?   ? ?Recommendations for follow up therapy are one component of a multi-disciplinary discharge planning process, led by the attending physician.  Recommendations may be updated based on patient status, additional functional criteria and insurance authorization. ? ?Follow Up Recommendations No PT follow up ? ?  ?Assistance Recommended at Discharge Intermittent Supervision/Assistance  ?Patient can return home with the following ? A lot of help with bathing/dressing/bathroom;Help with stairs or ramp for entrance;Assistance with cooking/housework;Assist for transportation ? ?  ?Equipment Recommendations None recommended by PT  ?Recommendations for Other Services ?    ?  ?Functional Status Assessment Patient has had a recent decline in their functional status and demonstrates the ability to make significant improvements in function in a reasonable and predictable amount of time.  ? ?   ?Precautions / Restrictions Precautions ?Precautions: Shoulder ?Type of Shoulder Precautions: No Active ROM of shoulder. Pendulums ok ?Shoulder Interventions: Shoulder sling/immobilizer;Off for dressing/bathing/exercises ?Precaution Booklet Issued: Yes (comment) ?Precaution Comments: shoulder instructions provided to pt and were reviewed ?Restrictions ?Weight Bearing Restrictions: Yes ?LUE Weight Bearing: Non weight bearing  ? ?  ? ?Mobility ? Bed Mobility ?  ?  ?  ?  ?  ?  ?  ?General bed mobility comments: in recliner on arrival ?  ? ?Transfers ?Overall transfer level: Needs assistance ?Equipment used: Straight cane ?Transfers: Sit to/from Stand ?Sit to Stand: Min assist ?  ?  ?  ?  ?  ?General transfer comment: minA for boost up and steadying ?  ? ?Ambulation/Gait ?Ambulation/Gait assistance: Min guard ?Gait Distance (Feet): 30 Feet ?Assistive device: Straight cane ?Gait Pattern/deviations: Step-to pattern, Decreased stride length, Decreased stance time - left, Decreased weight shift to left ?Gait velocity: decreased ?  ?  ?General Gait Details: min guard for safety. Decreased stance time on the L due to pain/numbness. Standing rest break after 20'. 3/4 DOE ? ?Stairs ?  ?  ?  ?  ?  ? ?Wheelchair Mobility ?  ? ?Modified Rankin (Stroke Patients Only) ?  ? ?  ? ?Balance Overall balance assessment: Needs assistance ?Sitting-balance support: Feet supported ?Sitting balance-Leahy Scale: Good ?  ?  ?Standing balance support: Single extremity supported, During functional activity ?Standing balance-Leahy Scale: Fair ?  ?  ?  ?  ?  ?  ?  ?  ?  ?  ?  ?  ?   ? ? ? ?Pertinent Vitals/Pain Pain Assessment ?Pain Assessment: 0-10 ?Pain Score: 6  ?Pain Location: Lt shoulder ?Pain Descriptors / Indicators: Operative site guarding ?Pain Intervention(s): Monitored during session  ? ? ?Home Living Family/patient expects to  be discharged to:: Private residence ?Living Arrangements: Spouse/significant other ?Available Help at  Discharge: Family;Friend(s);Available 24 hours/day ?Type of Home: House ?Home Access: Stairs to enter;Ramped entrance ?Entrance Stairs-Rails: Right ?Entrance Stairs-Number of Steps: 2 ?  ?Home Layout: One level ?Home Equipment: Conservation officer, nature (2 wheels);Cane - single point;BSC/3in1;Tub bench;Wheelchair - manual ?Additional Comments: Pts spouse is retired.  She also reports she has multiple roommates who can also assist as needed  ?  ?Prior Function Prior Level of Function : Independent/Modified Independent ?  ?  ?  ?  ?  ?  ?Mobility Comments: Pt reports she ambulated with SPC ?ADLs Comments: Pt reports she was able to complete ADLs mod I ?  ? ? ?Hand Dominance  ? Dominant Hand: Right ? ?  ?Extremity/Trunk Assessment  ? Upper Extremity Assessment ?Upper Extremity Assessment: Defer to OT evaluation ?LUE Deficits / Details: elbow distally AAROM WFL. Block has not fully worn off ?LUE: Unable to fully assess due to immobilization ?  ? ?Lower Extremity Assessment ?Lower Extremity Assessment: Generalized weakness (reporting L LE numbness which is new) ?  ? ?Cervical / Trunk Assessment ?Cervical / Trunk Assessment: Lordotic;Back Surgery  ?Communication  ? Communication: No difficulties  ?Cognition Arousal/Alertness: Awake/alert ?Behavior During Therapy: [facility] for tasks assessed/performed ?Overall Cognitive Status: Within Functional Limits for tasks assessed ?  ?  ?  ?  ?  ?  ?  ?  ?  ?  ?  ?  ?  ?  ?  ?  ?  ?  ?  ? ?  ?General Comments General comments (skin integrity, edema, etc.): pt was instructed how to don/doff sling - she requires min A to doff and max A to don ? ?  ?Exercises    ? ?Assessment/Plan  ?  ?PT Assessment Patient needs continued PT services  ?PT Problem List Decreased strength;Decreased activity tolerance;Decreased balance;Decreased mobility;Decreased safety awareness ? ?   ?  ?PT Treatment Interventions DME instruction;Gait training;Functional mobility training;Therapeutic exercise;Balance  training;Therapeutic activities;Patient/family education   ? ?PT Goals (Current goals can be found in the Care Plan section)  ?Acute Rehab PT Goals ?Patient Stated Goal: to go home ?PT Goal Formulation: With patient ?Time For Goal Achievement: 03/21/22 ?Potential to Achieve Goals: Good ? ?  ?Frequency Min 3X/week ?  ? ? ?Co-evaluation   ?  ?  ?  ?  ? ? ?  ?AM-PAC PT "6 Clicks" Mobility  ?Outcome Measure Help needed turning from your back to your side while in a flat bed without using bedrails?: A Little ?Help needed moving from lying on your back to sitting on the side of a flat bed without using bedrails?: A Little ?Help needed moving to and from a bed to a chair (including a wheelchair)?: A Little ?Help needed standing up from a chair using your arms (e.g., wheelchair or bedside chair)?: A Little ?Help needed to walk in hospital room?: A Little ?Help needed climbing 3-5 steps with a railing? : A Little ?6 Click Score: 18 ? ?  ?End of Session Equipment Utilized During Treatment: Gait belt ?Activity Tolerance: Patient tolerated treatment well ?Patient left: in chair;with call bell/phone within reach ?Nurse Communication: Mobility status ?PT Visit Diagnosis: Unsteadiness on feet (R26.81);Muscle weakness (generalized) (M62.81) ?  ? ?Time: 4944-9675 ?PT Time Calculation (min) (ACUTE ONLY): 16 min ? ? ?Charges:   PT Evaluation ?$PT Eval Moderate Complexity: 1 Mod ?  ?  ?   ? ? ?Jennaya Pogue A. Gilford Rile, PT, DPT ?Acute Rehabilitation Services ?Pager [phone] ?  Office [phone] ? ? ?[name] ?03/07/2022, 11:28 AM ? ?

## 2022-03-07 NOTE — Evaluation (Signed)
Occupational Therapy Evaluation Patient Details Name: [name] MRN: [id number] DOB: November 14, 1961 Today's Date: 03/07/2022   History of Present Illness This 61 y.o. female admitted for Lt RSA due to Lt shoulder arthritis.  PMH includes: anxiety, severe aortic valve stenosis, asthma, h/o breast CA, CAD, chronic diastolic HF, COPD, Depression, DM, HTN, morbid obesity, MI, neuromuscular disorder, PE, s/p back surgery,   Clinical Impression   Pt admitted with above. She demonstrates the below listed deficits and will benefit from continued OT to maximize safety and independence with BADLs. Pt presents to OT with the below listed deficits.  She currently requires max A for ADLs and reports spouse will assist her at home.  Began instruction re: sling management, precautions, and safety with ADLs - spouse not present.  Pt fatigues quite rapidly with activity - discussed use of w/c as needed to maintain safety and prevent falls - specifically encouraged pt to place w/c mid way between destinations to provide safe place to rest.  Will continue to follow with focus on education of caregiver.        Recommendations for follow up therapy are one component of a multi-disciplinary discharge planning process, led by the attending physician.  Recommendations may be updated based on patient status, additional functional criteria and insurance authorization.   Follow Up Recommendations  Follow physician's recommendations for discharge plan and follow up therapies    Assistance Recommended at Discharge Frequent or constant Supervision/Assistance  Patient can return home with the following A little help with walking and/or transfers;A lot of help with bathing/dressing/bathroom;Assistance with cooking/housework;Assist for transportation;Help with stairs or ramp for entrance    Functional Status Assessment  Patient has had a recent decline in their functional status and demonstrates the ability to make  significant improvements in function in a reasonable and predictable amount of time.  Equipment Recommendations  None recommended by OT    Recommendations for Other Services       Precautions / Restrictions Precautions Precautions: Shoulder Type of Shoulder Precautions: No Active ROM of shoulder. Pendulums ok Shoulder Interventions: Shoulder sling/immobilizer;Off for dressing/bathing/exercises Precaution Booklet Issued: Yes (comment) Precaution Comments: shoulder instructions provided to pt and were reviewed Restrictions Weight Bearing Restrictions: Yes LUE Weight Bearing: Non weight bearing      Mobility Bed Mobility Overal bed mobility: Needs Assistance Bed Mobility: Supine to Sit     Supine to sit: Min assist     General bed mobility comments: assist to lift trunk    Transfers                          Balance Overall balance assessment: Needs assistance Sitting-balance support: Feet supported Sitting balance-Leahy Scale: Good     Standing balance support: During functional activity Standing balance-Leahy Scale: Fair                             ADL either performed or assessed with clinical judgement   ADL Overall ADL's : Needs assistance/impaired Eating/Feeding: Modified independent   Grooming: Wash/dry hands;Wash/dry face;Oral care;Brushing hair;Min guard;Standing   Upper Body Bathing: Moderate assistance;Sitting Upper Body Bathing Details (indicate cue type and reason): reviewed safe method for bathing under Lt axilla Lower Body Bathing: Maximal assistance;Sit to/from stand   Upper Body Dressing : Maximal assistance;Sitting Upper Body Dressing Details (indicate cue type and reason): reviewed safe technique Lower Body Dressing: Total assistance;Sit to/from stand   Toilet Transfer: Minimal  assistance;Ambulation;Comfort height toilet;Grab bars (SPC)   Toileting- Clothing Manipulation and Hygiene: Total assistance;Sit to/from  stand Toileting - Clothing Manipulation Details (indicate cue type and reason): due to body habitus     Functional mobility during ADLs: Minimal assistance;Cane General ADL Comments: Pt reports that spouse will assist her with ADLs.  Reviewed safe techniques for all ADLs wtih her.  Spouse to arrive later in day and will review again with him present.  Pt fatigues with activity and required seated rest break when exiting BR.  Discussed concerns about mobility in her home and about entering her home. She reports she does have a w/c that she can use to assist, and it can be placed to provide rest breaks.  Encouraged her to work with PT, but she is currently deferring their services     Vision         Perception     Praxis      Pertinent Vitals/Pain Pain Assessment Pain Assessment: 0-10 Pain Score: 7  Pain Location: Lt shoulder Pain Descriptors / Indicators: Operative site guarding Pain Intervention(s): Monitored during session, Repositioned, Premedicated before session     Hand Dominance Right   Extremity/Trunk Assessment Upper Extremity Assessment Upper Extremity Assessment: LUE deficits/detail;Difficult to assess due to impaired cognition LUE Deficits / Details: elbow distally AAROM WFL. Block has not fully worn off LUE: Unable to fully assess due to immobilization       Cervical / Trunk Assessment Cervical / Trunk Assessment: Lordotic;Back Surgery   Communication Communication Communication: No difficulties   Cognition Arousal/Alertness: Awake/alert Behavior During Therapy: WFL for tasks assessed/performed Overall Cognitive Status: Within Functional Limits for tasks assessed                                       General Comments  pt was instructed how to don/doff sling - she requires min A to doff and max A to don    Exercises Exercises: Shoulder Shoulder Exercises Pendulum Exercise: AAROM, 10 reps, Standing, Left Elbow Flexion: AAROM, Left, 10  reps, Seated, Standing Elbow Extension: AAROM, Left, 10 reps, Seated, Standing Wrist Flexion: AROM, Left, 10 reps, Seated Wrist Extension: AROM, Left, 10 reps, Seated Digit Composite Flexion: Left, 10 reps, Seated, AROM Composite Extension: AROM, Left, 10 reps, Seated Neck Flexion: AROM, 10 reps, Seated Neck Extension: AROM, 10 reps, Seated Neck Lateral Flexion - Right: AROM, 10 reps, Seated Neck Lateral Flexion - Left: AROM, 10 reps, Seated   Shoulder Instructions Shoulder Instructions Donning/doffing shirt without moving shoulder: Maximal assistance Method for sponge bathing under operated UE: Maximal assistance Donning/doffing sling/immobilizer: Maximal assistance Correct positioning of sling/immobilizer: Moderate assistance Pendulum exercises (written home exercise program): Minimal assistance ROM for elbow, wrist and digits of operated UE: Moderate assistance (block has not fully worn off) Sling wearing schedule (on at all times/off for ADL's): Supervision/safety Proper positioning of operated UE when showering: Supervision/safety Positioning of UE while sleeping: Minimal assistance    Home Living Family/patient expects to be discharged to:: Private residence Living Arrangements: Spouse/significant other Available Help at Discharge: Family;Friend(s);Available 24 hours/day Type of Home: House Home Access: Stairs to enter;Ramped entrance Entrance Stairs-Number of Steps: 2 Entrance Stairs-Rails: Right Home Layout: One level     Bathroom Shower/Tub: Tub/shower unit;Curtain   Bathroom Toilet: Standard Bathroom Accessibility: Yes   Home Equipment: Agricultural consultant (2 wheels);Cane - single point;BSC/3in1;Tub bench;Wheelchair - manual   Additional Comments: Pts spouse is  retired.  She also reports she has multiple roommates who can also assist as needed      Prior Functioning/Environment Prior Level of Function : Independent/Modified Independent             Mobility  Comments: Pt reports she ambulated with [facility] ADLs Comments: Pt reports she was able to complete ADLs mod I        OT Problem List: Decreased strength;Decreased range of motion;Decreased activity tolerance;Impaired balance (sitting and/or standing);Decreased safety awareness;Decreased knowledge of use of DME or AE;Decreased knowledge of precautions;Obesity;Impaired UE functional use;Pain      OT Treatment/Interventions: Self-care/ADL training;Therapeutic exercise;DME and/or AE instruction;Therapeutic activities;Patient/family education;Balance training    OT Goals(Current goals can be found in the care plan section) Acute Rehab OT Goals Patient Stated Goal: for Lt shoulder to heal OT Goal Formulation: With patient Potential to Achieve Goals: Good ADL Goals Pt Will Perform Upper Body Bathing: with caregiver independent in assisting;sitting Pt Will Perform Upper Body Dressing: with caregiver independent in assisting;sitting Pt/caregiver will Perform Home Exercise Program: Left upper extremity;Independently Additional ADL Goal #1: Pt/caregiver will be independent with donning/doffing sling and with precautions  OT Frequency: Min 2X/week    Co-evaluation              AM-PAC OT "6 Clicks" Daily Activity     Outcome Measure Help from another person eating meals?: None Help from another person taking care of personal grooming?: A Little Help from another person toileting, which includes using toliet, bedpan, or urinal?: A Lot Help from another person bathing (including washing, rinsing, drying)?: A Lot Help from another person to put on and taking off regular upper body clothing?: A Lot Help from another person to put on and taking off regular lower body clothing?: Total 6 Click Score: 14   End of Session Equipment Utilized During Treatment: Other (comment) (sling) Nurse Communication: Mobility status  Activity Tolerance: Patient tolerated treatment well Patient left: in chair;with  call bell/phone within reach  OT Visit Diagnosis: Unsteadiness on feet (R26.81);Pain Pain - Right/Left: Left Pain - part of body: Shoulder                Time: 0925-1001 OT Time Calculation (min): 36 min Charges:  OT General Charges $OT Visit: 1 Visit OT Evaluation $OT Eval Moderate Complexity: 1 Mod OT Treatments $Self Care/Home Management : 8-22 mins  Eber Jones OTR/L Acute Rehabilitation Services Pager [phone] Office [phone]   Jeani Hawking M 03/07/2022, 10:56 AM

## 2022-03-07 NOTE — Anesthesia Postprocedure Evaluation (Signed)
Anesthesia Post Note ? ?Patient: [name] ? ?Procedure(s) Performed: LEFT SHOULDER REPLACEMENT APPLICATION OF WOUND VAC (Left: Shoulder) ? ?  ? ?Patient location during evaluation: PACU ?Anesthesia Type: Regional ?Level of consciousness: awake and alert ?Pain management: pain level controlled ?Vital Signs Assessment: post-procedure vital signs reviewed and stable ?Respiratory status: spontaneous breathing, nonlabored ventilation and respiratory function stable ?Cardiovascular status: blood pressure returned to baseline and stable ?Postop Assessment: no apparent nausea or vomiting ?Anesthetic complications: no ? ? ?No notable events documented. ? ?Last Vitals:  ?Vitals:  ? 03/07/22 0815 03/07/22 0837  ?BP: (!) 117/48   ?Pulse: 91 89  ?Resp: 16 16  ?Temp: 36.8 ?C   ?SpO2: 95% 93%  ?  ?Last Pain:  ?Vitals:  ? 03/07/22 1358  ?TempSrc:   ?PainSc: 4   ? ?Pain Goal: Patients Stated Pain Goal: 3 (03/07/22 0547) ? ?  ?  ?  ?  ?  ?  ?  ? ?Lynda Rainwater ? ? ? ? ?

## 2022-03-07 NOTE — Progress Notes (Signed)
Patient alert and oriented, mae's well, voiding adequate amount of urine, swallowing without difficulty, no c/o pain at time of discharge. Patient discharged home with family. Script and discharged instructions given to patient. Patient and family stated understanding of instructions given. Patient has an appointment with Dr. Marlou Sa next wednesday ?

## 2022-03-07 NOTE — Progress Notes (Signed)
Occupational Therapy Treatment Patient Details Name: [name] MRN: [id number] DOB: Nov 12, 1961 Today's Date: 03/07/2022   History of present illness This 61 y.o. female admitted for Lt RSA due to Lt shoulder arthritis.  PMH includes: anxiety, severe aortic valve stenosis, asthma, h/o breast CA, CAD, chronic diastolic HF, COPD, Depression, DM, HTN, morbid obesity, MI, neuromuscular disorder, PE, s/p back surgery,   OT comments  Pt spouse present and was instructed how to safely assist pt with ADLs.  Pt was able to independently instruct spouse how to assist her, and she demonstrates good understanding of precautions.    Recommendations for follow up therapy are one component of a multi-disciplinary discharge planning process, led by the attending physician.  Recommendations may be updated based on patient status, additional functional criteria and insurance authorization.    Follow Up Recommendations  Follow physician's recommendations for discharge plan and follow up therapies    Assistance Recommended at Discharge Frequent or constant Supervision/Assistance  Patient can return home with the following  A little help with walking and/or transfers;A lot of help with bathing/dressing/bathroom;Assistance with cooking/housework;Assist for transportation;Help with stairs or ramp for entrance   Equipment Recommendations  None recommended by OT    Recommendations for Other Services      Precautions / Restrictions Precautions Precautions: Shoulder Type of Shoulder Precautions: No Active ROM of shoulder. Pendulums ok Shoulder Interventions: Shoulder sling/immobilizer;Off for dressing/bathing/exercises Precaution Booklet Issued: Yes (comment) Precaution Comments: shoulder instructions provided to pt and were reviewed Restrictions Weight Bearing Restrictions: Yes LUE Weight Bearing: Non weight bearing       Mobility Bed Mobility Overal bed mobility: Needs Assistance Bed  Mobility: Supine to Sit     Supine to sit: Min assist     General bed mobility comments: assist to lift trunk    Transfers                         Balance Overall balance assessment: Needs assistance Sitting-balance support: Feet supported Sitting balance-Leahy Scale: Good     Standing balance support: During functional activity Standing balance-Leahy Scale: Fair                             ADL either performed or assessed with clinical judgement   ADL Overall ADL's : Needs assistance/impaired Eating/Feeding: Modified independent   Grooming: Wash/dry hands;Wash/dry face;Oral care;Brushing hair;Min guard;Standing   Upper Body Bathing: Moderate assistance;Sitting Upper Body Bathing Details (indicate cue type and reason): reviewed safe method for bathing under Lt axilla Lower Body Bathing: Maximal assistance;Sit to/from stand   Upper Body Dressing : Maximal assistance;Sitting Upper Body Dressing Details (indicate cue type and reason): reviewed safe technique Lower Body Dressing: Total assistance;Sit to/from stand   Toilet Transfer: Minimal assistance;Ambulation;Comfort height toilet;Grab bars (SPC)   Toileting- Clothing Manipulation and Hygiene: Total assistance;Sit to/from stand Toileting - Clothing Manipulation Details (indicate cue type and reason): due to body habitus     Functional mobility during ADLs: Minimal assistance;Cane General ADL Comments: spouse present and instructed how to safely assist her with ADLs    Extremity/Trunk Assessment Upper Extremity Assessment Upper Extremity Assessment: LUE deficits/detail LUE Deficits / Details: elbow distally AAROM WFL. Block has not fully worn off LUE: Unable to fully assess due to immobilization   Lower Extremity Assessment Lower Extremity Assessment: Generalized weakness (reporting L LE numbness which is new)   Cervical / Trunk Assessment Cervical / Trunk Assessment: Lordotic;Back  Surgery     Vision       Perception     Praxis      Cognition Arousal/Alertness: Awake/alert Behavior During Therapy: WFL for tasks assessed/performed Overall Cognitive Status: Within Functional Limits for tasks assessed                                          Exercises Exercises: Shoulder Shoulder Exercises Pendulum Exercise: AAROM, 10 reps, Standing, Left Elbow Flexion: AAROM, Left, 10 reps, Seated, Standing Elbow Extension: AAROM, Left, 10 reps, Seated, Standing Wrist Flexion: AROM, Left, 10 reps, Seated Wrist Extension: AROM, Left, 10 reps, Seated Digit Composite Flexion: Left, 10 reps, Seated, AROM Composite Extension: AROM, Left, 10 reps, Seated Neck Flexion: AROM, 10 reps, Seated Neck Extension: AROM, 10 reps, Seated Neck Lateral Flexion - Right: AROM, 10 reps, Seated Neck Lateral Flexion - Left: AROM, 10 reps, Seated    Shoulder Instructions Shoulder Instructions Donning/doffing shirt without moving shoulder: Maximal assistance;Patient able to independently direct caregiver Method for sponge bathing under operated UE: Maximal assistance;Patient able to independently direct caregiver Donning/doffing sling/immobilizer: Maximal assistance;Patient able to independently direct caregiver Correct positioning of sling/immobilizer: Moderate assistance;Patient able to independently direct caregiver Pendulum exercises (written home exercise program): Minimal assistance;Patient able to independently direct caregiver ROM for elbow, wrist and digits of operated UE: Moderate assistance;Patient able to independently direct caregiver Sling wearing schedule (on at all times/off for ADL's): Supervision/safety;Patient able to independently direct caregiver Proper positioning of operated UE when showering: Supervision/safety;Patient able to independently direct caregiver Positioning of UE while sleeping: Minimal assistance;Patient able to independently direct caregiver      General Comments Pt able to instruct her spouse how to provide assist.    Pertinent Vitals/ Pain       Pain Assessment Pain Assessment: 0-10 Pain Score: 6  Pain Location: Lt shoulder Pain Descriptors / Indicators: Operative site guarding Pain Intervention(s): Limited activity within patient's tolerance  Home Living Family/patient expects to be discharged to:: Private residence Living Arrangements: Spouse/significant other Available Help at Discharge: Family;Friend(s);Available 24 hours/day Type of Home: House Home Access: Stairs to enter;Ramped entrance Entrance Stairs-Number of Steps: 2 Entrance Stairs-Rails: Right Home Layout: One level     Bathroom Shower/Tub: Tub/shower unit;Curtain   Bathroom Toilet: Standard Bathroom Accessibility: Yes   Home Equipment: Conservation officer, nature (2 wheels);Cane - single point;BSC/3in1;Tub bench;Wheelchair - manual   Additional Comments: Pts spouse is retired.  She also reports she has multiple roommates who can also assist as needed      Prior Functioning/Environment              Frequency  Min 2X/week        Progress Toward Goals  OT Goals(current goals can now be found in the care plan section)  Progress towards OT goals: Progressing toward goals  Acute Rehab OT Goals Patient Stated Goal: for Lt shoulder to heal OT Goal Formulation: With patient Potential to Achieve Goals: Good ADL Goals Pt Will Perform Upper Body Bathing: with caregiver independent in assisting;sitting Pt Will Perform Upper Body Dressing: with caregiver independent in assisting;sitting Pt/caregiver will Perform Home Exercise Program: Left upper extremity;Independently Additional ADL Goal #1: Pt/caregiver will be independent with donning/doffing sling and with precautions  Plan Discharge plan remains appropriate    Co-evaluation                 AM-PAC OT "6 Clicks" Daily  Activity     Outcome Measure   Help from another person eating meals?:  None Help from another person taking care of personal grooming?: A Little Help from another person toileting, which includes using toliet, bedpan, or urinal?: A Lot Help from another person bathing (including washing, rinsing, drying)?: A Lot Help from another person to put on and taking off regular upper body clothing?: A Lot Help from another person to put on and taking off regular lower body clothing?: Total 6 Click Score: 14    End of Session Equipment Utilized During Treatment: Other (comment) (sling)  OT Visit Diagnosis: Unsteadiness on feet (R26.81);Pain Pain - Right/Left: Left Pain - part of body: Shoulder   Activity Tolerance Patient tolerated treatment well   Patient Left in chair;with call bell/phone within reach   Nurse Communication Mobility status        Time: 2800-3491 OT Time Calculation (min): 15 min  Charges: OT General Charges $OT Visit: 1 Visit OT Evaluation $OT Eval Moderate Complexity: 1 Mod OT Treatments $Self Care/Home Management : 8-22 mins  Nilsa Nutting., OTR/L Acute Rehabilitation Services Pager [phone] Office [phone]   Lucille Passy M 03/07/2022, 1:01 PM

## 2022-03-07 NOTE — Progress Notes (Signed)
Patient stable ?Block is not yet worn off fully but she does have some mobility to her hand ?Dressing intact with Prevena wound VAC functional ?Plan at this time is discharged home.  We will send her home on hydrocodone and muscle relaxer.  Follow-up with [place] next week to change [name] to Aquacel dressing. ?

## 2022-03-10 ENCOUNTER — Other Ambulatory Visit: Payer: Self-pay | Admitting: Student

## 2022-03-10 ENCOUNTER — Telehealth: Payer: Self-pay

## 2022-03-10 DIAGNOSIS — Z9889 Other specified postprocedural states: Secondary | ICD-10-CM

## 2022-03-10 DIAGNOSIS — G8929 Other chronic pain: Secondary | ICD-10-CM

## 2022-03-10 NOTE — Telephone Encounter (Signed)
Hey Mamie! ?I went ahead and placed a DME order for the chair. Let me know if there's anything else I can do! ? ?Thanks, ?Doren Custard ?

## 2022-03-10 NOTE — Telephone Encounter (Signed)
Requesting lift recliner, please call pt back.  ?

## 2022-03-10 NOTE — Telephone Encounter (Signed)
Transition Care Management Follow-up Telephone Call ?Date of discharge and from where: 03/07/2022 from [facility] ?How have you been since you were released from the hospital? Patient stated that she is feeling okay. Patient stated that the pain is tolerable.  ?Any questions or concerns? No ? ?Items Reviewed: ?Did the pt receive and understand the discharge instructions provided? Yes  ?Medications obtained and verified? Yes  ?Other? No  ?Any new allergies since your discharge? No  ?Dietary orders reviewed? Yes ?Do you have support at home? Yes  ? ?Functional Questionnaire: (I = Independent and D = Dependent) ?ADLs: I ? ?Bathing/Dressing- I ? ?Meal Prep- I ? ?Eating- I ? ?Maintaining continence- I ? ?Transferring/Ambulation- I ? ?Managing Meds- I ? ? ?Follow up appointments reviewed: ? ?PCP Hospital f/u appt confirmed? No   ?Specialist Hospital f/u appt confirmed? Yes  Scheduled to see Marcene Duos, MD on 03/21/2022 @ 10:15am. ?Are transportation arrangements needed? No  ?If their condition worsens, is the pt aware to call PCP or go to the Emergency Dept.? Yes ?Was the patient provided with contact information for the PCP's office or ED? Yes ?Was to pt encouraged to call back with questions or concerns? Yes ? ?

## 2022-03-10 NOTE — Telephone Encounter (Signed)
I returned phone call to patient requesting a [name] chair.The patient just had shoulder surgery done on 03-07-2022.the patient has a return appointment on 03-21-2022 for a follow up visit with the ortho.patient says it will help her to get up and move around because she does not have much strength right now to push her self up.I will send a note to her PCP. ?

## 2022-03-13 ENCOUNTER — Telehealth: Payer: Self-pay | Admitting: Orthopedic Surgery

## 2022-03-13 ENCOUNTER — Other Ambulatory Visit: Payer: Self-pay | Admitting: Surgical

## 2022-03-13 MED ORDER — TRAMADOL HCL 50 MG PO TABS: 50.0000 mg | ORAL_TABLET | Freq: Four times a day (QID) | ORAL | 0 refills | Status: DC | PRN

## 2022-03-13 NOTE — Telephone Encounter (Signed)
Rxed tramadol and dced norco

## 2022-03-13 NOTE — Telephone Encounter (Signed)
Pt called asking for a new prescription. Pt prefers to take tramadol. Pt states hydrocodone give her a headache. Please send to pharmacy on file. Please call pt when called in to pharmacy. Pt phone number is [phone]. ?

## 2022-03-16 ENCOUNTER — Other Ambulatory Visit: Payer: Self-pay | Admitting: Student

## 2022-03-16 DIAGNOSIS — J441 Chronic obstructive pulmonary disease with (acute) exacerbation: Secondary | ICD-10-CM

## 2022-03-21 ENCOUNTER — Other Ambulatory Visit: Payer: Self-pay

## 2022-03-21 ENCOUNTER — Encounter: Payer: Self-pay | Admitting: Orthopedic Surgery

## 2022-03-21 ENCOUNTER — Ambulatory Visit (INDEPENDENT_AMBULATORY_CARE_PROVIDER_SITE_OTHER): Payer: Medicaid Other

## 2022-03-21 ENCOUNTER — Ambulatory Visit (INDEPENDENT_AMBULATORY_CARE_PROVIDER_SITE_OTHER): Payer: Medicaid Other | Admitting: Orthopedic Surgery

## 2022-03-21 DIAGNOSIS — Z96612 Presence of left artificial shoulder joint: Secondary | ICD-10-CM

## 2022-03-21 NOTE — Progress Notes (Signed)
? ?Post-Op Visit Note ?  ?Patient: [name]           ?Date of Birth: 12-07-1961           ?MRN: [id number] ?Visit Date: 03/21/2022 ?PCP: [name], MD ? ? ?Assessment & Plan: ? ?Chief Complaint:  ?Chief Complaint  ?Patient presents with  ? Left Shoulder - Routine Post Op  ?  03/06/22 left TSA  ? ?Visit Diagnoses:  ?1. History of arthroplasty of left shoulder   ? ? ?Plan: [name] is a patient who is now 2 weeks out left reverse shoulder replacement.  Almost to 90 on the CPM.  On exam the incision is intact.  Deltoid fires.  Plan is to continue physical therapy at [facility] 1-2 times a week for 4 weeks for left shoulder strengthening and range of motion.  Follow-up in 4 weeks for clinical recheck.  She is having some numbness down her arm consistent with her known cervical radiculopathy.  Grip strength intact. ? ?Follow-Up Instructions: Return in about 4 weeks (around 04/18/2022).  ? ?Orders:  ?Orders Placed This Encounter  ?Procedures  ? XR Shoulder Left  ? Ambulatory referral to Physical Therapy  ? ?No orders of the defined types were placed in this encounter. ? ? ?Imaging: ?XR Shoulder Left ? ?Result Date: 03/21/2022 ?AP axillary outlet radiographs left shoulder reviewed.  Reverse shoulder replacement in good position alignment with no complicating features.  Shoulder is located.  ? ?PMFS History: ?Patient Active Problem List  ? Diagnosis Date Noted  ? 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 Healthcare maintenance 11/17/2021  ? Stress reaction 11/17/2021  ? Spondylolisthesis, lumbar region 02/11/2021  ?  Class: Acute  ? Malignant neoplasm of upper-outer quadrant of right breast in female, estrogen receptor positive ([place]) 06/10/2018  ? Restless leg syndrome 02/04/2018  ? 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  ? Depression 08/06/2015  ? Hypertension associated with diabetes ([place])   ? Aortic stenosis   ? Chronic diastolic congestive heart failure ([place])   ? COPD  (chronic obstructive pulmonary disease) ([place]) 09/21/2013  ? Obstructive sleep apnea 09/21/2013  ? Nocturnal hypoxemia 09/12/2013  ? Hyperlipidemia 11/30/2012  ? History of pulmonary embolus (PE) 07/09/2012  ? Normocytic anemia 02/08/2012  ? Type 2 diabetes mellitus without complication, without long-term current use of insulin ([place]) 02/08/2012  ? Asthma 02/08/2012  ? GERD (gastroesophageal reflux disease)   ? CAD (coronary artery disease) 12/08/2011  ? ?Past Medical History:  ?Diagnosis Date  ? Anemia   ? Anxiety   ? Aortic valve stenosis, severe   ? Arthritis   ? PAIN AND SEVERE OA LEFT KNEE ; S/P RIGHT TKA ON 02/03/12; HAS LOWER BACK PAIN-UNABLE TO STAND MORE THAN 10 MIN; ARTHRITIS "ALL OVER"  ? Asthma   ? Blood transfusion   ? [facility]  ? Breast cancer in female [facility])   ? Right  ? CAD (coronary artery disease)   ? Cath 2010 with DES x 1 RCA-- PT'S CARDIOLOGIST IS DR. Alanson  ? Chronic diastolic congestive heart failure ([place])   ? COPD (chronic obstructive pulmonary disease) ([place])   ? Depression   ? Diabetes mellitus DIAGNOSED IN2010  ? Dyspnea   ? with much ambulation  ? Eczema   ? on back  ? Headache   ? migraines younger- rare now 02/07/21  ? Heart murmur   ? no current problems  ? History of hiatal hernia   ? History of kidney stones   ?  passed or blasted  ? Hyperlipidemia   ? Hypertension   ? Morbid obesity with body mass index of 60.0-69.9 in adult [facility])   ? Myocardial infarction [facility])   ? PT THINKS SHE WAS DX WITH MI AT THE TIME OF HEART STENTING  ? Neuromuscular disorder ([place])   ? bilateral arm/hands  ? Oxygen dependent   ? uses 3L oxygen night/prn  ? Personal history of radiation therapy   ? Pneumonia   ? Pulmonary embolism ([place]) 02/08/2012  ? S/P RT TOTAL KNEE ON 02/03/12--ON 02/08/12--DEVELOPED ACUTE SOB AND CHEST PAIN--AND DIAGNOSED WITH  PULMONARY EMBOLUS AND PNEUMONIA  ? Restless leg syndrome   ? Sleep apnea   ? uses 3 liters O2 at night as needed  ? Uterine fibroid   ? NO PROBLEMS AT PRESENT FROM THE  FIBROIDS-STATES SHE IS POST MENOPAUSAL-LAST MENSES 2010 EXCEPT FOR EPISODE THIS YR OF BLEEDING RELATED TO FIBROIDS.  ? Weakness   ? BOTH HANDS - S/P BILATERAL CARPAL TUNNEL RELEASE--BUT STILL HAS WEAKNESS--OFTEN DROPS THINGS  ?  ?Family History  ?Problem Relation Age of Onset  ? Breast cancer Mother   ?     stage IV at diagnosis  ? Emphysema Mother   ?     smoked  ? Heart disease Mother   ? COPD Father   ?     smoked  ? Asthma Father   ? Heart disease Father   ? Cancer Brother   ?     Sinus  ?  ?Past Surgical History:  ?Procedure Laterality Date  ? BACK SURGERY  02/11/2021  ? Dr Louanne Skye  ? BREAST BIOPSY Right 06/04/2018  ? BREAST LUMPECTOMY Right 06/2018  ? BREAST LUMPECTOMY WITH RADIOACTIVE SEED AND SENTINEL LYMPH NODE BIOPSY Right 07/19/2018  ? Procedure: BREAST LUMPECTOMY WITH RADIOACTIVE SEED AND SENTINEL LYMPH NODE BIOPSY;  Surgeon: Alphonsa Overall, MD;  Location: [place];  Service: General;  Laterality: Right;  ? CARDIAC CATHETERIZATION    ? CARDIAC VALVE REPLACEMENT    ? 2017  ? CARPAL TUNNEL RELEASE    ? Bilateral  ? CHOLECYSTECTOMY    ? COLONOSCOPY    ? CORONARY ANGIOPLASTY    ? 2010 has stent in place  ? CYSTOSCOPY W/ RETROGRADES Right 09/21/2013  ? Procedure: CYSTOSCOPY WITH RIGHT RETROGRADE PYELOGRAM RIGHT DOUBLE J STENT ;  Surgeon: Fredricka Bonine, MD;  Location: [facility];  Service: Urology;  Laterality: Right;  ? CYSTOSCOPY WITH URETEROSCOPY AND STENT PLACEMENT Right 10/25/2013  ? Procedure: CYSTOSCOPY RIGHT URETEROSCOPY HOLMIUM LASER LITHO AND STENT PLACEMENT;  Surgeon: Fredricka Bonine, MD;  Location: [facility];  Service: Urology;  Laterality: Right;  ? HERNIA REPAIR    ? INTRAOPERATIVE TRANSESOPHAGEAL ECHOCARDIOGRAM N/A 12/12/2014  ? Procedure: INTRAOPERATIVE TRANSESOPHAGEAL ECHOCARDIOGRAM;  Surgeon: Burnell Blanks, MD;  Location: [facility] OR;  Service: Cardiovascular;  Laterality: N/A;  ? JOINT REPLACEMENT    ? bil total knees  ? KNEE ARTHROPLASTY  02/03/2012  ? Procedure: COMPUTER ASSISTED  TOTAL KNEE ARTHROPLASTY;  Surgeon: Mcarthur Rossetti, MD;  Location: [place];  Service: Orthopedics;  Laterality: Right;  Right total knee arthroplasty  ? LEFT AND RIGHT HEART CATHETERIZATION WITH CORONARY ANGIOGRAM N/A 03/17/2013  ? Procedure: LEFT AND RIGHT HEART CATHETERIZATION WITH CORONARY ANGIOGRAM;  Surgeon: Burnell Blanks, MD;  Location[facility] CATH LAB;  Service: Cardiovascular;  Laterality: N/A;  ? LEFT AND RIGHT HEART CATHETERIZATION WITH CORONARY/GRAFT ANGIOGRAM N/A 09/14/2014  ? Procedure: LEFT AND RIGHT HEART CATHETERIZATION WITH CORONARY/GRAFT ANGIOGRAM;  Surgeon: Burnell Blanks, MD;  Location: [facility] CATH LAB;  Service: Cardiovascular;  Laterality: N/A;  ? REVERSE SHOULDER ARTHROPLASTY Left 03/06/2022  ? Procedure: LEFT SHOULDER REPLACEMENT APPLICATION OF WOUND VAC;  Surgeon: [name], MD;  Location: [place];  Service: Orthopedics;  Laterality: Left;  ? TEE WITHOUT CARDIOVERSION N/A 03/14/2013  ? Procedure: TRANSESOPHAGEAL ECHOCARDIOGRAM (TEE);  Surgeon: Lelon Perla, MD;  Location: [facility] ENDOSCOPY;  Service: Cardiovascular;  Laterality: N/A;  ? TEE WITHOUT CARDIOVERSION N/A 11/14/2014  ? Procedure: TRANSESOPHAGEAL ECHOCARDIOGRAM (TEE);  Surgeon: Lelon Perla, MD;  Location: [facility] ENDOSCOPY;  Service: Cardiovascular;  Laterality: N/A;  ? TONSILLECTOMY    ? maybe as a child- does not know  ? TOTAL KNEE ARTHROPLASTY  09/10/2012  ? Procedure: TOTAL KNEE ARTHROPLASTY;  Surgeon: Mcarthur Rossetti, MD;  Location: [facility];  Service: Orthopedics;  Laterality: Left;  ? TOTAL KNEE REVISION Right 07/15/2013  ? Procedure: REVISION ARTHROPLASTY RIGHT KNEE;  Surgeon: Mcarthur Rossetti, MD;  Location: [facility];  Service: Orthopedics;  Laterality: Right;  ? TRANSCATHETER AORTIC VALVE REPLACEMENT, TRANSFEMORAL N/A 12/12/2014  ? Procedure: TRANSCATHETER AORTIC VALVE REPLACEMENT, TRANSFEMORAL;  Surgeon: Burnell Blanks, MD;  Location: [place];  Service: Cardiovascular;  Laterality: N/A;   ? TRIGGER FINGER RELEASE  09/10/2012  ? Procedure: RELEASE TRIGGER FINGER/A-1 PULLEY;  Surgeon: Mcarthur Rossetti, MD;  Location: [facility];  Service: Orthopedics;  Laterality: Right;  Right Ring Fing

## 2022-03-22 DIAGNOSIS — M19012 Primary osteoarthritis, left shoulder: Secondary | ICD-10-CM

## 2022-03-27 NOTE — Discharge Summary (Signed)
Physician Discharge Summary  ? ? ? ? ?Patient ID: ?[name] ?MRN: [id number] ?DOB/AGE: 63/17/1962 63 y.o. ? ?Admit date: 03/06/2022 ?Discharge date: 03/07/2022 ? ?Admission Diagnoses:  ?Principal Problem: ?  S/P reverse total shoulder arthroplasty, left ?Active Problems: ?  Arthritis of left shoulder region ? ? ?Discharge Diagnoses:  ?Same ? ?Surgeries: Procedure(s): ?LEFT SHOULDER REPLACEMENT APPLICATION OF WOUND VAC on 03/06/2022 ?  ?Consultants:  ? ?Discharged Condition: Stable ? ?Hospital Course: Rafaella G Lundin is an 61 y.o. female who was admitted 03/06/2022 with a chief complaint of left shoulder pain, and found to have a diagnosis of left shoulder osteoarthritis.  They were brought to the operating room on 03/06/2022 and underwent the above named procedures.  Pt awoke from anesthesia without complication and was transferred to the floor. On POD1, patient's pain was overall controlled and she was able to mobilize well without any dizziness or lightheadedness.  No complaint of chest pain, shortness of breath, abdominal pain throughout her stay.  She was discharged home on POD 1..  Pt will f/u with Dr. Dean in clinic in ~2 weeks.  ? ?Antibiotics given:  ?Anti-infectives (From admission, onward)  ? ? Start     Dose/Rate Route Frequency Ordered Stop  ? 03/06/22 2000  ceFAZolin (ANCEF) IVPB 2g/100 mL premix  Status:  Discontinued       ? 2 g ?200 mL/hr over 30 Minutes Intravenous Every 8 hours 03/06/22 1648 03/07/22 1923  ? 03/06/22 1445  vancomycin (VANCOCIN) powder  Status:  Discontinued       ?   As needed 03/06/22 1515 03/06/22 1612  ? 03/06/22 0900  ceFAZolin (ANCEF) IVPB 3g/100 mL premix       ? 3 g ?200 mL/hr over 30 Minutes Intravenous On call to O.R. 03/06/22 0853 03/06/22 1203  ? ?  ?. ? ?Recent vital signs:  ?Vitals:  ? 03/07/22 0815 03/07/22 0837  ?BP: (!) 117/48   ?Pulse: 91 89  ?Resp: 16 16  ?Temp: 98.3 ?F (36.8 ?C)   ?SpO2: 95% 93%  ? ? ?Recent laboratory studies:  ?Results for orders placed or  performed during the hospital encounter of 03/06/22  ?SARS CORONAVIRUS 2 (TAT 6-24 HRS) Nasopharyngeal Nasopharyngeal Swab  ? Specimen: Nasopharyngeal Swab  ?Result Value Ref Range  ? SARS Coronavirus 2 NEGATIVE NEGATIVE  ?Glucose, capillary  ?Result Value Ref Range  ? Glucose-Capillary 274 (H) 70 - 99 mg/dL  ?Glucose, capillary  ?Result Value Ref Range  ? Glucose-Capillary 184 (H) 70 - 99 mg/dL  ? Comment 1 Notify RN   ?Glucose, capillary  ?Result Value Ref Range  ? Glucose-Capillary 173 (H) 70 - 99 mg/dL  ?CBC  ?Result Value Ref Range  ? WBC 10.0 4.0 - 10.5 K/uL  ? RBC 4.12 3.87 - 5.11 MIL/uL  ? Hemoglobin 12.1 12.0 - 15.0 g/dL  ? HCT 36.0 36.0 - 46.0 %  ? MCV 87.4 80.0 - 100.0 fL  ? MCH 29.4 26.0 - 34.0 pg  ? MCHC 33.6 30.0 - 36.0 g/dL  ? RDW 12.8 11.5 - 15.5 %  ? Platelets 187 150 - 400 K/uL  ? nRBC 0.0 0.0 - 0.2 %  ?Basic metabolic panel  ?Result Value Ref Range  ? Sodium 135 135 - 145 mmol/L  ? Potassium 4.1 3.5 - 5.1 mmol/L  ? Chloride 97 (L) 98 - 111 mmol/L  ? CO2 27 22 - 32 mmol/L  ? Glucose, Bld 205 (H) 70 - 99 mg/dL  ? BUN 13 8 -   23 mg/dL  ? Creatinine, Ser 0.91 0.44 - 1.00 mg/dL  ? Calcium 8.8 (L) 8.9 - 10.3 mg/dL  ? GFR, Estimated >60 >60 mL/min  ? Anion gap 11 5 - 15  ?Glucose, capillary  ?Result Value Ref Range  ? Glucose-Capillary 294 (H) 70 - 99 mg/dL  ? Comment 1 Notify RN   ? Comment 2 Document in Chart   ?Glucose, capillary  ?Result Value Ref Range  ? Glucose-Capillary 207 (H) 70 - 99 mg/dL  ? Comment 1 Notify RN   ? Comment 2 Document in Chart   ?Glucose, capillary  ?Result Value Ref Range  ? Glucose-Capillary 158 (H) 70 - 99 mg/dL  ? ? ?Discharge Medications:   ?Allergies as of 03/07/2022   ?No Known Allergies ?  ? ?  ?Medication List  ?  ? ?STOP taking these medications   ? ?acetaminophen 500 MG tablet ?Commonly known as: TYLENOL ?  ?nitroGLYCERIN 0.4 MG SL tablet ?Commonly known as: Nitrostat ?  ?traMADol 50 MG tablet ?Commonly known as: ULTRAM ?  ? ?  ? ?TAKE these medications   ? ?Accu-Chek  FastClix Lancets Misc ?Check blood sugar two times a day ?  ?Accu-Chek Guide test strip ?Generic drug: glucose blood ?Check blood sugar 2 times per day ?  ?Accu-Chek Guide w/Device Kit ?1 each by Does not apply route 2 (two) times daily. ?  ?amlodipine-atorvastatin 5-80 MG tablet ?Commonly known as: Caduet ?Take 1 tablet by mouth daily. ?  ?Aspirin Adult Low Strength 81 MG EC tablet ?Generic drug: aspirin ?TAKE 1 Tablet BY MOUTH ONCE DAILY ?  ?busPIRone 5 MG tablet ?Commonly known as: BUSPAR ?TAKE 1 TABLET BY MOUTH THREE TIMES DAILY ?What changed:  ?how much to take ?how to take this ?when to take this ?  ?clopidogrel 75 MG tablet ?Commonly known as: PLAVIX ?TAKE 1 TABLET BY MOUTH IN THE MORNING ?  ?docusate sodium 100 MG capsule ?Commonly known as: COLACE ?Take 1 capsule (100 mg total) by mouth 2 (two) times daily. ?  ?DULoxetine 60 MG capsule ?Commonly known as: CYMBALTA ?Take 1 capsule by mouth once daily ?  ?exemestane 25 MG tablet ?Commonly known as: AROMASIN ?Take 1 tablet (25 mg total) by mouth daily after breakfast. ?  ?ezetimibe 10 MG tablet ?Commonly known as: ZETIA ?Take 1 tablet (10 mg total) by mouth daily. ?  ?ferrous gluconate 324 MG tablet ?Commonly known as: FERGON ?Take 1 tablet (324 mg total) by mouth 2 (two) times daily with a meal. ?  ?furosemide 40 MG tablet ?Commonly known as: LASIX ?Take 1 tablet (40 mg total) by mouth as needed for fluid or edema. ?  ?gabapentin 300 MG capsule ?Commonly known as: NEURONTIN ?Take 3 capsules po at bedtime and 1 capsule po in the AM ?  ?Insulin Pen Needle 30G X 5 MM Misc ?1 application by Does not apply route daily. ?  ?losartan-hydrochlorothiazide 100-25 MG tablet ?Commonly known as: Hyzaar ?Take 1 tablet by mouth daily. ?  ?meloxicam 15 MG tablet ?Commonly known as: MOBIC ?Take 15 mg by mouth daily as needed for pain. ?  ?methocarbamol 500 MG tablet ?Commonly known as: ROBAXIN ?Take 1 tablet (500 mg total) by mouth every 6 (six) hours as needed for muscle  spasms. ?  ?metoprolol succinate 50 MG 24 hr tablet ?Commonly known as: Toprol XL ?Take 1 tablet (50 mg total) by mouth daily. Take with or immediately following a meal. ?  ?Potassium 99 MG Tabs ?Take 99 mg by mouth  daily. ?  ?Repatha SureClick 140 MG/ML Soaj ?Generic drug: Evolocumab ?INJECT 1 PEN INTO THE SKIN EVERY 14 DAYS ?  ?Trelegy Ellipta 100-62.5-25 MCG/ACT Aepb ?Generic drug: Fluticasone-Umeclidin-Vilant ?INHALE 1 PUFF ONCE DAILY Strength: 100-62.5-25 MCG/ACT ?  ?Victoza 18 MG/3ML Sopn ?Generic drug: liraglutide ?INJECT 1.8 MG INTO THE SKIN ONCE DAILY ?Strength: 18 MG/3ML ?  ?vitamin C 1000 MG tablet ?Take 1,000 mg by mouth daily. ?  ? ?  ? ? ?Diagnostic Studies: DG Shoulder Left Port ? ?Result Date: 03/06/2022 ?CLINICAL DATA:  Status post reverse total shoulder arthroplasty. EXAM: LEFT SHOULDER COMPARISON:  Left shoulder x-ray 07/10/2021. FINDINGS: There is a new left shoulder arthroplasty in place in anatomic alignment. There is no evidence for fracture or hardware loosening. Soft tissues are otherwise within normal limits. Small left pleural effusion is present. IMPRESSION: 1. New left shoulder arthroplasty in anatomic alignment. 2. Small left pleural effusion. Electronically Signed   By: Amy  Guttmann M.D.   On: 03/06/2022 17:12  ? ?XR Shoulder Left ? ?Result Date: 03/21/2022 ?AP axillary outlet radiographs left shoulder reviewed.  Reverse shoulder replacement in good position alignment with no complicating features.  Shoulder is located.  ? ?Disposition: Discharge disposition: 01-Home or Self Care ? ? ? ? ? ? ?Discharge Instructions   ? ? Call MD / Call 911   Complete by: As directed ?  ? If you experience chest pain or shortness of breath, CALL 911 and be transported to the hospital emergency room.  If you develope a fever above 101 F, pus (white drainage) or increased drainage or redness at the wound, or calf pain, call your surgeon's office.  ? Constipation Prevention   Complete by: As directed ?  ?  Drink plenty of fluids.  Prune juice may be helpful.  You may use a stool softener, such as Colace (over the counter) 100 mg twice a day.  Use MiraLax (over the counter) for constipation as needed.  ? Diet

## 2022-03-29 DIAGNOSIS — Z419 Encounter for procedure for purposes other than remedying health state, unspecified: Secondary | ICD-10-CM | POA: Diagnosis not present

## 2022-03-30 ENCOUNTER — Other Ambulatory Visit: Payer: Self-pay | Admitting: Specialist

## 2022-03-30 DIAGNOSIS — E119 Type 2 diabetes mellitus without complications: Secondary | ICD-10-CM

## 2022-03-31 ENCOUNTER — Ambulatory Visit: Payer: Medicaid Other | Attending: Specialist

## 2022-03-31 DIAGNOSIS — G8929 Other chronic pain: Secondary | ICD-10-CM | POA: Insufficient documentation

## 2022-03-31 DIAGNOSIS — M25512 Pain in left shoulder: Secondary | ICD-10-CM | POA: Insufficient documentation

## 2022-03-31 DIAGNOSIS — M542 Cervicalgia: Secondary | ICD-10-CM | POA: Insufficient documentation

## 2022-03-31 DIAGNOSIS — Z96612 Presence of left artificial shoulder joint: Secondary | ICD-10-CM | POA: Insufficient documentation

## 2022-03-31 DIAGNOSIS — M6281 Muscle weakness (generalized): Secondary | ICD-10-CM | POA: Insufficient documentation

## 2022-03-31 NOTE — Therapy (Addendum)
?OUTPATIENT PHYSICAL THERAPY SHOULDER EVALUATION ? ? ?Patient Name: [name] ?MRN: [id number] ?DOB:[birth year], 61 y.o., female ?Today's Date: 03/31/2022 ? ? PT End of Session - 03/31/22 1655   ? ? Visit Number 1   ? Number of Visits 20   ? Date for PT Re-Evaluation 06/23/22   ? Authorization Type [employer]   ? PT Start Time 1610   ? PT Stop Time 7948   ? PT Time Calculation (min) 25 min   ? Activity Tolerance Patient tolerated treatment well   ? Behavior During Therapy [facility] for tasks assessed/performed   ? ?  ?  ? ?  ? ? ?Past Medical History:  ?Diagnosis Date  ? Anemia   ? Anxiety   ? Aortic valve stenosis, severe   ? Arthritis   ? PAIN AND SEVERE OA LEFT KNEE ; S/P RIGHT TKA ON 02/03/12; HAS LOWER BACK PAIN-UNABLE TO STAND MORE THAN 10 MIN; ARTHRITIS "ALL OVER"  ? Asthma   ? Blood transfusion   ? 2013[facility]  ? Breast cancer in female [facility])   ? Right  ? CAD (coronary artery disease)   ? Cath 2010 with DES x 1 RCA-- PT'S CARDIOLOGIST IS DR. Briarwood  ? Chronic diastolic congestive heart failure ([place])   ? COPD (chronic obstructive pulmonary disease) ([place])   ? Depression   ? Diabetes mellitus DIAGNOSED IN2010  ? Dyspnea   ? with much ambulation  ? Eczema   ? on back  ? Headache   ? migraines younger- rare now 02/07/21  ? Heart murmur   ? no current problems  ? History of hiatal hernia   ? History of kidney stones   ? passed or blasted  ? Hyperlipidemia   ? Hypertension   ? Morbid obesity with body mass index of 60.0-69.9 in adult [facility])   ? Myocardial infarction [facility])   ? PT THINKS SHE WAS DX WITH MI AT THE TIME OF HEART STENTING  ? Neuromuscular disorder ([place])   ? bilateral arm/hands  ? Oxygen dependent   ? uses 3L oxygen night/prn  ? Personal history of radiation therapy   ? Pneumonia   ? Pulmonary embolism ([place]) 02/08/2012  ? S/P RT TOTAL KNEE ON 02/03/12--ON 02/08/12--DEVELOPED ACUTE SOB AND CHEST PAIN--AND DIAGNOSED WITH  PULMONARY EMBOLUS AND PNEUMONIA  ? Restless leg syndrome   ? Sleep apnea   ? uses 3 liters  O2 at night as needed  ? Uterine fibroid   ? NO PROBLEMS AT PRESENT FROM THE FIBROIDS-STATES SHE IS POST MENOPAUSAL-LAST MENSES 2010 EXCEPT FOR EPISODE THIS YR OF BLEEDING RELATED TO FIBROIDS.  ? Weakness   ? BOTH HANDS - S/P BILATERAL CARPAL TUNNEL RELEASE--BUT STILL HAS WEAKNESS--OFTEN DROPS THINGS  ? ?Past Surgical History:  ?Procedure Laterality Date  ? BACK SURGERY  02/11/2021  ? Dr Louanne Skye  ? BREAST BIOPSY Right 06/04/2018  ? BREAST LUMPECTOMY Right 06/2018  ? BREAST LUMPECTOMY WITH RADIOACTIVE SEED AND SENTINEL LYMPH NODE BIOPSY Right 07/19/2018  ? Procedure: BREAST LUMPECTOMY WITH RADIOACTIVE SEED AND SENTINEL LYMPH NODE BIOPSY;  Surgeon: Alphonsa Overall, MD;  Location: [place];  Service: General;  Laterality: Right;  ? CARDIAC CATHETERIZATION    ? CARDIAC VALVE REPLACEMENT    ? 2017  ? CARPAL TUNNEL RELEASE    ? Bilateral  ? CHOLECYSTECTOMY    ? COLONOSCOPY    ? CORONARY ANGIOPLASTY    ? 2010 has stent in place  ? CYSTOSCOPY W/ RETROGRADES Right 09/21/2013  ?  Procedure: CYSTOSCOPY WITH RIGHT RETROGRADE PYELOGRAM RIGHT DOUBLE J STENT ;  Surgeon: Fredricka Bonine, MD;  Location: [facility];  Service: Urology;  Laterality: Right;  ? CYSTOSCOPY WITH URETEROSCOPY AND STENT PLACEMENT Right 10/25/2013  ? Procedure: CYSTOSCOPY RIGHT URETEROSCOPY HOLMIUM LASER LITHO AND STENT PLACEMENT;  Surgeon: Fredricka Bonine, MD;  Location: [facility];  Service: Urology;  Laterality: Right;  ? HERNIA REPAIR    ? INTRAOPERATIVE TRANSESOPHAGEAL ECHOCARDIOGRAM N/A 12/12/2014  ? Procedure: INTRAOPERATIVE TRANSESOPHAGEAL ECHOCARDIOGRAM;  Surgeon: Burnell Blanks, MD;  Location: [facility] OR;  Service: Cardiovascular;  Laterality: N/A;  ? JOINT REPLACEMENT    ? bil total knees  ? KNEE ARTHROPLASTY  02/03/2012  ? Procedure: COMPUTER ASSISTED TOTAL KNEE ARTHROPLASTY;  Surgeon: Mcarthur Rossetti, MD;  Location: [place];  Service: Orthopedics;  Laterality: Right;  Right total knee arthroplasty  ? LEFT AND RIGHT HEART CATHETERIZATION  WITH CORONARY ANGIOGRAM N/A 03/17/2013  ? Procedure: LEFT AND RIGHT HEART CATHETERIZATION WITH CORONARY ANGIOGRAM;  Surgeon: Burnell Blanks, MD;  Location: [facility] CATH LAB;  Service: Cardiovascular;  Laterality: N/A;  ? LEFT AND RIGHT HEART CATHETERIZATION WITH CORONARY/GRAFT ANGIOGRAM N/A 09/14/2014  ? Procedure: LEFT AND RIGHT HEART CATHETERIZATION WITH Beatrix Fetters;  Surgeon: Burnell Blanks, MD;  Location: [facility] CATH LAB;  Service: Cardiovascular;  Laterality: N/A;  ? REVERSE SHOULDER ARTHROPLASTY Left 03/06/2022  ? Procedure: LEFT SHOULDER REPLACEMENT APPLICATION OF WOUND VAC;  Surgeon: [name], MD;  Location: [place];  Service: Orthopedics;  Laterality: Left;  ? TEE WITHOUT CARDIOVERSION N/A 03/14/2013  ? Procedure: TRANSESOPHAGEAL ECHOCARDIOGRAM (TEE);  Surgeon: Lelon Perla, MD;  Location: [facility] ENDOSCOPY;  Service: Cardiovascular;  Laterality: N/A;  ? TEE WITHOUT CARDIOVERSION N/A 11/14/2014  ? Procedure: TRANSESOPHAGEAL ECHOCARDIOGRAM (TEE);  Surgeon: Lelon Perla, MD;  Location: [facility] ENDOSCOPY;  Service: Cardiovascular;  Laterality: N/A;  ? TONSILLECTOMY    ? maybe as a child- does not know  ? TOTAL KNEE ARTHROPLASTY  09/10/2012  ? Procedure: TOTAL KNEE ARTHROPLASTY;  Surgeon: Mcarthur Rossetti, MD;  Location[facility];  Service: Orthopedics;  Laterality: Left;  ? TOTAL KNEE REVISION Right 07/15/2013  ? Procedure: REVISION ARTHROPLASTY RIGHT KNEE;  Surgeon: Mcarthur Rossetti, MD;  Location[facility];  Service: Orthopedics;  Laterality: Right;  ? TRANSCATHETER AORTIC VALVE REPLACEMENT, TRANSFEMORAL N/A 12/12/2014  ? Procedure: TRANSCATHETER AORTIC VALVE REPLACEMENT, TRANSFEMORAL;  Surgeon: Burnell Blanks, MD;  Location: [place];  Service: Cardiovascular;  Laterality: N/A;  ? TRIGGER FINGER RELEASE  09/10/2012  ? Procedure: RELEASE TRIGGER FINGER/A-1 PULLEY;  Surgeon: Mcarthur Rossetti, MD;  Location: [facility];  Service: Orthopedics;  Laterality: Right;  Right  Ring Finger  ? TUBAL LIGATION    ? ?Patient Active Problem List  ? Diagnosis Date Noted  ? Arthritis of left shoulder region   ? 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 Healthcare maintenance 11/17/2021  ? Stress reaction 11/17/2021  ? Spondylolisthesis, lumbar region 02/11/2021  ?  Class: Acute  ? Malignant neoplasm of upper-outer quadrant of right breast in female, estrogen receptor positive ([place]) 06/10/2018  ? Restless leg syndrome 02/04/2018  ? 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  ? Depression 08/06/2015  ? Hypertension associated with diabetes ([place])   ? Aortic stenosis   ? Chronic diastolic congestive heart failure ([place])   ? COPD (chronic obstructive pulmonary disease) ([place]) 09/21/2013  ? Obstructive sleep apnea 09/21/2013  ? Nocturnal hypoxemia 09/12/2013  ? Hyperlipidemia 11/30/2012  ? History of pulmonary embolus (  [place]) 07/09/2012  ? Normocytic anemia 02/08/2012  ? Type 2 diabetes mellitus without complication, without long-term current use of insulin ([place]) 02/08/2012  ? Asthma 02/08/2012  ? GERD (gastroesophageal reflux disease)   ? CAD (coronary artery disease) 12/08/2011  ? ? ?PCP: [name], MD ? ?REFERRING PROVIDER: [name], MD ? ?REFERRING DIAG:  ?J28.786 (ICD-10-CM) - History of arthroplasty of left shoulder ? ?THERAPY DIAG:  ?Chronic left shoulder pain - Plan: PT plan of care cert/re-cert ? ?Muscle weakness (generalized) - Plan: PT plan of care cert/re-cert ? ? ?ONSET DATE: 03/06/2022 ? ?SUBJECTIVE:                                                                                                                                                                                     ? ?SUBJECTIVE STATEMENT: ?Pt presents to PT s/p L reverse TSA performed by Dr. Marlou Sa on 03/06/2022. Pt has had a good bit of pain since surgery but it stays fairly well controlled with medication. She is RHD but has been very limited by her left shoulder for quite a while. She has  complex PMH along with referral to address neck pain from different provider.  ? ?PERTINENT HISTORY: ?HTN, DMII, MI, Anxiety/Depression, COPD, breast cancer ? ?PAIN:  ?Are you having pain?  ?Yes: NPRS s

## 2022-04-01 ENCOUNTER — Telehealth: Payer: Self-pay | Admitting: Orthopedic Surgery

## 2022-04-01 NOTE — Telephone Encounter (Signed)
Pt called requesting a refill of tramadol. Please send to pharmacy on file. Pt phone number is [phone]. ?

## 2022-04-02 ENCOUNTER — Other Ambulatory Visit: Payer: Self-pay | Admitting: Surgical

## 2022-04-02 MED ORDER — TRAMADOL HCL 50 MG PO TABS: 50.0000 mg | ORAL_TABLET | Freq: Four times a day (QID) | ORAL | 0 refills | Status: DC | PRN

## 2022-04-02 NOTE — Telephone Encounter (Signed)
I left voicemail for patient advising. 

## 2022-04-02 NOTE — Telephone Encounter (Signed)
Sent to pharm ....

## 2022-04-03 ENCOUNTER — Telehealth: Payer: Self-pay | Admitting: Student

## 2022-04-03 NOTE — Telephone Encounter (Signed)
.. ?  Medicaid Managed Care  ? ?Unsuccessful Outreach Note ? ?04/03/2022 ?Name: [name] MRN: [id number] DOB: 01-29-1961 ? ?Referred by: [name], MD ?Reason for referral : High Risk Managed Medicaid (I called the patient today to get her scheduled with the MM Team. I left my name and number on her VM.) ? ? ?An unsuccessful telephone outreach was attempted today. The patient was referred to the case management team for assistance with care management and care coordination.  ? ?Follow Up Plan: The care management team will reach out to the patient again over the next 7-14 days.  ? ? ?Reita Chard ?Care Guide, High Risk Medicaid Managed Care ?Embedded Care Coordination ?New Site  ? ? ?SIGNATURE  ?

## 2022-04-09 ENCOUNTER — Ambulatory Visit: Payer: Medicaid Other

## 2022-04-09 DIAGNOSIS — Z96612 Presence of left artificial shoulder joint: Secondary | ICD-10-CM | POA: Diagnosis not present

## 2022-04-09 DIAGNOSIS — M6281 Muscle weakness (generalized): Secondary | ICD-10-CM | POA: Diagnosis not present

## 2022-04-09 DIAGNOSIS — G8929 Other chronic pain: Secondary | ICD-10-CM

## 2022-04-09 DIAGNOSIS — M542 Cervicalgia: Secondary | ICD-10-CM | POA: Diagnosis not present

## 2022-04-09 DIAGNOSIS — M25512 Pain in left shoulder: Secondary | ICD-10-CM | POA: Diagnosis not present

## 2022-04-09 NOTE — Therapy (Addendum)
?OUTPATIENT PHYSICAL THERAPY TREATMENT NOTE ? ? ?Patient Name: [name] ?MRN: [id number] ?DOB:[birth year], 61 y.o., female ?Today's Date: 04/09/2022 ? ?PCP: [name], MD ?REFERRING PROVIDER: [name], MD ? ?END OF SESSION:  ? PT End of Session - 04/09/22 1813   ? ? Visit Number 2   ? Number of Visits 20   ? Date for PT Re-Evaluation 06/23/22   ? Authorization Type MCD [employer]   ? PT Start Time 1815   ? PT Stop Time 7322   ? PT Time Calculation (min) 38 min   ? Activity Tolerance Patient tolerated treatment well   ? Behavior During Therapy [employer] for tasks assessed/performed   ? ?  ?  ? ?  ? ? ?Past Medical History:  ?Diagnosis Date  ? Anemia   ? Anxiety   ? Aortic valve stenosis, severe   ? Arthritis   ? PAIN AND SEVERE OA LEFT KNEE ; S/P RIGHT TKA ON 02/03/12; HAS LOWER BACK PAIN-UNABLE TO STAND MORE THAN 10 MIN; ARTHRITIS "ALL OVER"  ? Asthma   ? Blood transfusion   ? [facility]  ? Breast cancer in female [facility])   ? Right  ? CAD (coronary artery disease)   ? Cath 2010 with DES x 1 RCA-- PT'S CARDIOLOGIST IS DR. Linda  ? Chronic diastolic congestive heart failure ([place])   ? COPD (chronic obstructive pulmonary disease) ([place])   ? Depression   ? Diabetes mellitus DIAGNOSED IN2010  ? Dyspnea   ? with much ambulation  ? Eczema   ? on back  ? Headache   ? migraines younger- rare now 02/07/21  ? Heart murmur   ? no current problems  ? History of hiatal hernia   ? History of kidney stones   ? passed or blasted  ? Hyperlipidemia   ? Hypertension   ? Morbid obesity with body mass index of 60.0-69.9 in adult [facility])   ? Myocardial infarction [facility])   ? PT THINKS SHE WAS DX WITH MI AT THE TIME OF HEART STENTING  ? Neuromuscular disorder ([place])   ? bilateral arm/hands  ? Oxygen dependent   ? uses 3L oxygen night/prn  ? Personal history of radiation therapy   ? Pneumonia   ? Pulmonary embolism ([place]) 02/08/2012  ? S/P RT TOTAL KNEE ON 02/03/12--ON 02/08/12--DEVELOPED ACUTE SOB AND CHEST PAIN--AND DIAGNOSED WITH   PULMONARY EMBOLUS AND PNEUMONIA  ? Restless leg syndrome   ? Sleep apnea   ? uses 3 liters O2 at night as needed  ? Uterine fibroid   ? NO PROBLEMS AT PRESENT FROM THE FIBROIDS-STATES SHE IS POST MENOPAUSAL-LAST MENSES 2010 EXCEPT FOR EPISODE THIS YR OF BLEEDING RELATED TO FIBROIDS.  ? Weakness   ? BOTH HANDS - S/P BILATERAL CARPAL TUNNEL RELEASE--BUT STILL HAS WEAKNESS--OFTEN DROPS THINGS  ? ?Past Surgical History:  ?Procedure Laterality Date  ? BACK SURGERY  02/11/2021  ? Dr Louanne Skye  ? BREAST BIOPSY Right 06/04/2018  ? BREAST LUMPECTOMY Right 06/2018  ? BREAST LUMPECTOMY WITH RADIOACTIVE SEED AND SENTINEL LYMPH NODE BIOPSY Right 07/19/2018  ? Procedure: BREAST LUMPECTOMY WITH RADIOACTIVE SEED AND SENTINEL LYMPH NODE BIOPSY;  Surgeon: Alphonsa Overall, MD;  Location: [place];  Service: General;  Laterality: Right;  ? CARDIAC CATHETERIZATION    ? CARDIAC VALVE REPLACEMENT    ? 2017  ? CARPAL TUNNEL RELEASE    ? Bilateral  ? CHOLECYSTECTOMY    ? COLONOSCOPY    ? CORONARY ANGIOPLASTY    ?  2010 has stent in place  ? CYSTOSCOPY W/ RETROGRADES Right 09/21/2013  ? Procedure: CYSTOSCOPY WITH RIGHT RETROGRADE PYELOGRAM RIGHT DOUBLE J STENT ;  Surgeon: Fredricka Bonine, MD;  [facility];  Service: Urology;  Laterality: Right;  ? CYSTOSCOPY WITH URETEROSCOPY AND STENT PLACEMENT Right 10/25/2013  ? Procedure: CYSTOSCOPY RIGHT URETEROSCOPY HOLMIUM LASER LITHO AND STENT PLACEMENT;  Surgeon: Fredricka Bonine, MD;  Location[facility];  Service: Urology;  Laterality: Right;  ? HERNIA REPAIR    ? INTRAOPERATIVE TRANSESOPHAGEAL ECHOCARDIOGRAM N/A 12/12/2014  ? Procedure: INTRAOPERATIVE TRANSESOPHAGEAL ECHOCARDIOGRAM;  Surgeon: Burnell Blanks, MD;  Location[facility] OR;  Service: Cardiovascular;  Laterality: N/A;  ? JOINT REPLACEMENT    ? bil total knees  ? KNEE ARTHROPLASTY  02/03/2012  ? Procedure: COMPUTER ASSISTED TOTAL KNEE ARTHROPLASTY;  Surgeon: Mcarthur Rossetti, MD;  Location: [place];  Service: Orthopedics;   Laterality: Right;  Right total knee arthroplasty  ? LEFT AND RIGHT HEART CATHETERIZATION WITH CORONARY ANGIOGRAM N/A 03/17/2013  ? Procedure: LEFT AND RIGHT HEART CATHETERIZATION WITH CORONARY ANGIOGRAM;  Surgeon: Burnell Blanks, MD;  Location: [facility] CATH LAB;  Service: Cardiovascular;  Laterality: N/A;  ? LEFT AND RIGHT HEART CATHETERIZATION WITH CORONARY/GRAFT ANGIOGRAM N/A 09/14/2014  ? Procedure: LEFT AND RIGHT HEART CATHETERIZATION WITH Beatrix Fetters;  Surgeon: Burnell Blanks, MD;  Location: [facility] CATH LAB;  Service: Cardiovascular;  Laterality: N/A;  ? REVERSE SHOULDER ARTHROPLASTY Left 03/06/2022  ? Procedure: LEFT SHOULDER REPLACEMENT APPLICATION OF WOUND VAC;  Surgeon: [name], MD;  Location: [place];  Service: Orthopedics;  Laterality: Left;  ? TEE WITHOUT CARDIOVERSION N/A 03/14/2013  ? Procedure: TRANSESOPHAGEAL ECHOCARDIOGRAM (TEE);  Surgeon: Lelon Perla, MD;  Location[facility] ENDOSCOPY;  Service: Cardiovascular;  Laterality: N/A;  ? TEE WITHOUT CARDIOVERSION N/A 11/14/2014  ? Procedure: TRANSESOPHAGEAL ECHOCARDIOGRAM (TEE);  Surgeon: Lelon Perla, MD;  Location: [facility] ENDOSCOPY;  Service: Cardiovascular;  Laterality: N/A;  ? TONSILLECTOMY    ? maybe as a child- does not know  ? TOTAL KNEE ARTHROPLASTY  09/10/2012  ? Procedure: TOTAL KNEE ARTHROPLASTY;  Surgeon: Mcarthur Rossetti, MD;  [facility];  Service: Orthopedics;  Laterality: Left;  ? TOTAL KNEE REVISION Right 07/15/2013  ? Procedure: REVISION ARTHROPLASTY RIGHT KNEE;  Surgeon: Mcarthur Rossetti, MD;  [facility];  Service: Orthopedics;  Laterality: Right;  ? TRANSCATHETER AORTIC VALVE REPLACEMENT, TRANSFEMORAL N/A 12/12/2014  ? Procedure: TRANSCATHETER AORTIC VALVE REPLACEMENT, TRANSFEMORAL;  Surgeon: Burnell Blanks, MD;  Location: [place];  Service: Cardiovascular;  Laterality: N/A;  ? TRIGGER FINGER RELEASE  09/10/2012  ? Procedure: RELEASE TRIGGER FINGER/A-1 PULLEY;  Surgeon:  Mcarthur Rossetti, MD;  [facility];  Service: Orthopedics;  Laterality: Right;  Right Ring Finger  ? TUBAL LIGATION    ? ?Patient Active Problem List  ? Diagnosis Date Noted  ? Arthritis of left shoulder region   ? 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 Healthcare maintenance 11/17/2021  ? Stress reaction 11/17/2021  ? Spondylolisthesis, lumbar region 02/11/2021  ?  Class: Acute  ? Malignant neoplasm of upper-outer quadrant of right breast in female, estrogen receptor positive ([place]) 06/10/2018  ? Restless leg syndrome 02/04/2018  ? 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  ? Depression 08/06/2015  ? Hypertension associated with diabetes ([place])   ? Aortic stenosis   ? Chronic diastolic congestive heart failure ([place])   ? COPD (chronic obstructive pulmonary disease) ([place]) 09/21/2013  ? Obstructive sleep apnea 09/21/2013  ?  Nocturnal hypoxemia 09/12/2013  ? Hyperlipidemia 11/30/2012  ? History of pulmonary embolus ([place]) 07/09/2012  ? Normocytic anemia 02/08/2012  ? Type 2 diabetes mellitus without complication, without long-term current use of insulin ([place]) 02/08/2012  ? Asthma 02/08/2012  ? GERD (gastroesophageal reflux disease)   ? CAD (coronary artery disease) 12/08/2011  ? ? ?REFERRING DIAG:  ?G31.517 (ICD-10-CM) - History of arthroplasty of left shoulder ? ?THERAPY DIAG:  ?Chronic left shoulder pain ? ?Muscle weakness (generalized) ? ?PERTINENT HISTORY:  ?HTN, DMII, MI, Anxiety/Depression, COPD, breast cancer ? ?PRECAUTIONS:  ?Shoulder - reverse TSA ? ?SUBJECTIVE:  ?Pt presents to PT with reports of continued L shoulder pain. Pt has been fairly compliant with HEP. Pt is ready to begin PT at this time.  ? ?Pain: ?Are you having pain?  ?Yes: NPRS scale: 5/10 (10/10 at worst) ?Pain location: L shoulder ?Pain description: sharp  ?Aggravating factors: accidentally rolling on L side ?Relieving factors: ice, medication ? ?OBJECTIVE:  ?  ?PATIENT SURVEYS:  ?Quick Dash 68%  disability ?  ?UE ROM: ?  ?AROM Right ?03/31/2022 Left ?03/31/2022  ?Shoulder flexion WFL 95 PROM  ?Shoulder extension      ?Shoulder abduction WFL 94 PROM  ?Shoulder internal rotation WFL 40 PROM  ?Shoulder external rotati

## 2022-04-10 ENCOUNTER — Telehealth: Payer: Self-pay | Admitting: Student

## 2022-04-10 NOTE — Telephone Encounter (Signed)
.. ?  Medicaid Managed Care  ? ?Unsuccessful Outreach Note ? ?04/10/2022 ?Name: [name] MRN: [id number] DOB: [birth year] ? ?Referred by: [name], MD ?Reason for referral : High Risk Managed Medicaid (I called the patient today to get her scheduled with the MM Team. I had to leave my name and number on her VM.) ? ? ?A second unsuccessful telephone outreach was attempted today. The patient was referred to the case management team for assistance with care management and care coordination.  ? ?Follow Up Plan: The care management team will reach out to the patient again over the next 7-14 days.  ? ? ? ?Reita Chard ?Care Guide, High Risk Medicaid Managed Care ?Embedded Care Coordination ?[place]  ? ? ?SIGNATURE  ?

## 2022-04-14 ENCOUNTER — Other Ambulatory Visit: Payer: Self-pay | Admitting: Obstetrics and Gynecology

## 2022-04-14 ENCOUNTER — Ambulatory Visit: Payer: Medicaid Other

## 2022-04-14 DIAGNOSIS — G8929 Other chronic pain: Secondary | ICD-10-CM

## 2022-04-14 DIAGNOSIS — M6281 Muscle weakness (generalized): Secondary | ICD-10-CM

## 2022-04-14 DIAGNOSIS — Z96612 Presence of left artificial shoulder joint: Secondary | ICD-10-CM | POA: Diagnosis not present

## 2022-04-14 DIAGNOSIS — M25512 Pain in left shoulder: Secondary | ICD-10-CM | POA: Diagnosis not present

## 2022-04-14 DIAGNOSIS — M542 Cervicalgia: Secondary | ICD-10-CM | POA: Diagnosis not present

## 2022-04-14 NOTE — Patient Instructions (Signed)
Hi [name], I enjoyed speaking with you today-have a wonderful day!! ? ?[name] was given information about Medicaid Managed Care team care coordination services as a part of their [facility] Medicaid benefit. [name] verbally consented to engagement with the [facility] Managed Care team.  ? ?If you are experiencing a medical emergency, please call 911 or report to your local emergency department or urgent care.  ? ?If you have a non-emergency medical problem during routine business hours, please contact your provider's office and ask to speak with a nurse.  ? ?For questions related to your [facility] health plan, please call: [phone] or go here:[email or web address] ? ?If you would like to schedule transportation through your [facility] plan, please call the following number at least 2 days in advance of your appointment: [phone]. ? You can also use the MTM portal or MTM mobile app to manage your rides. For the portal, please go to [email or web address]. ? ?Call the [place] at [phone], at any time, 24 hours a day, 7 days a week. If you are in danger or need immediate medical attention call 911. ? ?If you would like help to quit smoking, call 1-800-QUIT-NOW 8023514855) OR Espa?ol: 1-855-D?jelo-Ya 5127535545) o para m?s informaci?n haga clic aqu? or Text READY to 200-400 to register via text ? ?[name] - following are the goals we discussed in your visit today:  ? Goals Addressed   ? ?Long-Range Goal: Establish Plan of Care for Chronic Disease Management Needs   ?Start Date: 04/14/2022  ?Expected End Date: 07/14/2022  ?Priority: High  ?Note:   ?Timeframe:  Long-Range Goal ?Priority:  High ?Start Date:      04/14/22                       ?Expected End Date:     ongoing                 ? ?Follow Up Date 05/16/22  ?  ?- schedule appointment for flu shot ?- schedule appointment for vaccines needed due to my age or health ?- schedule  recommended health tests (blood work, mammogram, colonoscopy, pap test) ?- schedule and keep appointment for annual check-up  ?  ?Why is this important?   ?Screening tests can find diseases early when they are easier to treat.  ?Your doctor or nurse will talk with you about which tests are important for you.  ?Getting shots for common diseases like the flu and shingles will help prevent them.   ? ?Patient verbalizes understanding of instructions and care plan provided today and agrees to view in [place]. Active MyChart status confirmed with patient.   ? ?The Managed Medicaid care management team will reach out to the patient again over the next 30 days.  ?The  Patient  has been provided with contact information for the Managed Medicaid care management team and has been advised to call with any health related questions or concerns.  ? ?Aida Raider RN, BSN ?[facility] ?Care Management Coordinator - Managed Medicaid High Risk ?[phone] ?  ?Following is a copy of your plan of care:  ?Care Plan : [place] of Care  ?Updates made by [name] Medicus, RN since 04/14/2022 12:00 AM  ?  ? ?Problem: Health Promotion or Disease Self-Management (General Plan of Care)   ?Priority: High  ?Onset Date: 04/14/2022  ?  ? ?Long-Range Goal: Chronic Disease Management and Care Coordination Needs   ?  Start Date: 04/14/2022  ?Expected End Date: 07/14/2022  ?This Visit's Progress: Not on track  ?Priority: High  ?Note:   ?Current Barriers:  ?Knowledge Deficits related to plan of care for management of Anxiety and Depression  ?Care Coordination needs related to appointment ?Chronic Disease Management support and education needs related to Anxiety and Depression  ? ?RNCM Clinical Goal(s):  ?Patient will verbalize understanding of plan for management of Anxiety and Depression as evidenced by patient report ?take all medications exactly as prescribed and will call provider for medication related questions as  evidenced by patient report ?demonstrate understanding of rationale for each prescribed medication as evidenced by patient report ?attend all scheduled medical appointments  as evidenced by patient report ?continue to work with RN Care Manager to address care management and care coordination needs related to  Anxiety and Depression as evidenced by adherence to CM Team Scheduled appointments ?work with Education officer, museum to address  related to the management of [place] Concerns  related to the management of Anxiety and Depression as evidenced by review of EMR and patient or social worker report through collaboration with Consulting civil engineer, provider, and care team.  ? ?Interventions: ?Inter-disciplinary care team collaboration (see longitudinal plan of care) ?Evaluation of current treatment plan related to  self management and patient's adherence to plan as established by provider ?  (Status:  New goal.)  Long Term Goal ?Evaluation of current treatment plan related to Anxiety and Depression, Mental Health Concerns  self-management and patient's adherence to plan as established by provider. ?Discussed plans with patient for ongoing care management follow up and provided patient with direct contact information for care management team ?Advised patient to contact provider for any additional DME needs ?Reviewed medications with patient ?Collaborated with LCSW regarding anxiety/depression ?Reviewed scheduled/upcoming provider appointments ?Advised patient, providing education and rationale, to check cbg and record, calling provider  for findings outside established parameters  ?Social Work referral for anxiety/depression ?Discussed plans with patient for ongoing care management follow up and provided patient with direct contact information for care management team ?Assessed social determinant of health barriers ? ?Patient Goals/Self-Care Activities: ?Take all medications as prescribed ?Attend all scheduled provider  appointments ?Call pharmacy for medication refills 3-7 days in advance of running out of medications ?Perform all self care activities independently  ?Perform IADL's (shopping, preparing meals, housekeeping, managing finances) independently ?Call provider office for new concerns or questions  ?Work with the Education officer, museum to address care coordination needs and will continue to work with the clinical team to address health care and disease management related needs ? ?Follow Up Plan:  The patient has been provided with contact information for the care management team and has been advised to call with any health related questions or concerns.  ?The care management team will reach out to the patient again over the next 30 business  days.   ? ? ?  ?

## 2022-04-14 NOTE — Therapy (Addendum)
?OUTPATIENT PHYSICAL THERAPY TREATMENT NOTE ? ? ?Patient Name: [name] ?MRN: [id number] ?DOB:04/16/1961, 61 y.o., female ?Today's Date: 04/15/2022 ? ?PCP: [name], MD ?REFERRING PROVIDER: [name], MD ? ?END OF SESSION:  ? PT End of Session - 04/14/22 1654   ? ? Visit Number 3   ? Number of Visits 20   ? Date for PT Re-Evaluation 06/23/22   ? Authorization Type [employer]   ? PT Start Time 1657   ? PT Stop Time 1728   additonal 19mn of vaso post  ? PT Time Calculation (min) 31 min   ? Activity Tolerance Patient tolerated treatment well   ? [facility]   ? ?  ?  ? ?  ? ? ? ?Past Medical History:  ?Diagnosis Date  ? Anemia   ? Anxiety   ? Aortic valve stenosis, severe   ? Arthritis   ? PAIN AND SEVERE OA LEFT KNEE ; S/P RIGHT TKA ON 02/03/12; HAS LOWER BACK PAIN-UNABLE TO STAND MORE THAN 10 MIN; ARTHRITIS "ALL OVER"  ? Asthma   ? Blood transfusion   ? 2013-[facility] ? Breast cancer in female ([facility]   ? Right  ? CAD (coronary artery disease)   ? Cath 2010 with DES x 1 RCA-- PT'S CARDIOLOGIST IS DR. MLicking ? Chronic diastolic congestive heart failure ([place]   ? COPD (chronic obstructive pulmonary disease) ([place]   ? Depression   ? Diabetes mellitus DIAGNOSED IN2010  ? Dyspnea   ? with much ambulation  ? Eczema   ? on back  ? Headache   ? migraines younger- rare now 02/07/21  ? Heart murmur   ? no current problems  ? History of hiatal hernia   ? History of kidney stones   ? passed or blasted  ? Hyperlipidemia   ? Hypertension   ? Morbid obesity with body mass index of 60.0-69.9 in adult ([facility]   ? Myocardial infarction ([facility]   ? PT THINKS SHE WAS DX WITH MI AT THE TIME OF HEART STENTING  ? Neuromuscular disorder ([place]   ? bilateral arm/hands  ? Oxygen dependent   ? uses 3L oxygen night/prn  ? Personal history of radiation therapy   ? Pneumonia   ? Pulmonary embolism ([place] 02/08/2012  ? S/P RT TOTAL KNEE ON 02/03/12--ON 02/08/12--DEVELOPED ACUTE SOB AND  CHEST PAIN--AND DIAGNOSED WITH  PULMONARY EMBOLUS AND PNEUMONIA  ? Restless leg syndrome   ? Sleep apnea   ? uses 3 liters O2 at night as needed  ? Uterine fibroid   ? NO PROBLEMS AT PRESENT FROM THE FIBROIDS-STATES SHE IS POST MENOPAUSAL-LAST MENSES 2010 EXCEPT FOR EPISODE THIS YR OF BLEEDING RELATED TO FIBROIDS.  ? Weakness   ? BOTH HANDS - S/P BILATERAL CARPAL TUNNEL RELEASE--BUT STILL HAS WEAKNESS--OFTEN DROPS THINGS  ? ?Past Surgical History:  ?Procedure Laterality Date  ? BACK SURGERY  02/11/2021  ? Dr NLouanne Skye ? BREAST BIOPSY Right 06/04/2018  ? BREAST LUMPECTOMY Right 06/2018  ? BREAST LUMPECTOMY WITH RADIOACTIVE SEED AND SENTINEL LYMPH NODE BIOPSY Right 07/19/2018  ? Procedure: BREAST LUMPECTOMY WITH RADIOACTIVE SEED AND SENTINEL LYMPH NODE BIOPSY;  Surgeon: NAlphonsa Overall MD;  [place]  Service: General;  Laterality: Right;  ? CARDIAC CATHETERIZATION    ? CARDIAC VALVE REPLACEMENT    ? 2017  ? CARPAL TUNNEL RELEASE    ? Bilateral  ? CHOLECYSTECTOMY    ? COLONOSCOPY    ?  CORONARY ANGIOPLASTY    ? 2010 has stent in place  ? CYSTOSCOPY W/ RETROGRADES Right 09/21/2013  ? Procedure: CYSTOSCOPY WITH RIGHT RETROGRADE PYELOGRAM RIGHT DOUBLE J STENT ;  Surgeon: Fredricka Bonine, MD;  [facility];  Service: Urology;  Laterality: Right;  ? CYSTOSCOPY WITH URETEROSCOPY AND STENT PLACEMENT Right 10/25/2013  ? Procedure: CYSTOSCOPY RIGHT URETEROSCOPY HOLMIUM LASER LITHO AND STENT PLACEMENT;  Surgeon: Fredricka Bonine, MD;  Location: [facility];  Service: Urology;  Laterality: Right;  ? HERNIA REPAIR    ? INTRAOPERATIVE TRANSESOPHAGEAL ECHOCARDIOGRAM N/A 12/12/2014  ? Procedure: INTRAOPERATIVE TRANSESOPHAGEAL ECHOCARDIOGRAM;  Surgeon: Burnell Blanks, MD;  Location: [facility] OR;  Service: Cardiovascular;  Laterality: N/A;  ? JOINT REPLACEMENT    ? bil total knees  ? KNEE ARTHROPLASTY  02/03/2012  ? Procedure: COMPUTER ASSISTED TOTAL KNEE ARTHROPLASTY;  Surgeon: Mcarthur Rossetti, MD;  Location:  [place];  Service: Orthopedics;  Laterality: Right;  Right total knee arthroplasty  ? LEFT AND RIGHT HEART CATHETERIZATION WITH CORONARY ANGIOGRAM N/A 03/17/2013  ? Procedure: LEFT AND RIGHT HEART CATHETERIZATION WITH CORONARY ANGIOGRAM;  Surgeon: Burnell Blanks, MD;  Location: [facility] CATH LAB;  Service: Cardiovascular;  Laterality: N/A;  ? LEFT AND RIGHT HEART CATHETERIZATION WITH CORONARY/GRAFT ANGIOGRAM N/A 09/14/2014  ? Procedure: LEFT AND RIGHT HEART CATHETERIZATION WITH Beatrix Fetters;  Surgeon: Burnell Blanks, MD;  Location: [facility] CATH LAB;  Service: Cardiovascular;  Laterality: N/A;  ? REVERSE SHOULDER ARTHROPLASTY Left 03/06/2022  ? Procedure: LEFT SHOULDER REPLACEMENT APPLICATION OF WOUND VAC;  Surgeon: [name], MD;  Location: [place];  Service: Orthopedics;  Laterality: Left;  ? TEE WITHOUT CARDIOVERSION N/A 03/14/2013  ? Procedure: TRANSESOPHAGEAL ECHOCARDIOGRAM (TEE);  Surgeon: Lelon Perla, MD;  Location[facility] ENDOSCOPY;  Service: Cardiovascular;  Laterality: N/A;  ? TEE WITHOUT CARDIOVERSION N/A 11/14/2014  ? Procedure: TRANSESOPHAGEAL ECHOCARDIOGRAM (TEE);  Surgeon: Lelon Perla, MD;  Location: [facility] ENDOSCOPY;  Service: Cardiovascular;  Laterality: N/A;  ? TONSILLECTOMY    ? maybe as a child- does not know  ? TOTAL KNEE ARTHROPLASTY  09/10/2012  ? Procedure: TOTAL KNEE ARTHROPLASTY;  Surgeon: Mcarthur Rossetti, MD;  [facility];  Service: Orthopedics;  Laterality: Left;  ? TOTAL KNEE REVISION Right 07/15/2013  ? Procedure: REVISION ARTHROPLASTY RIGHT KNEE;  Surgeon: Mcarthur Rossetti, MD;  [facility];  Service: Orthopedics;  Laterality: Right;  ? TRANSCATHETER AORTIC VALVE REPLACEMENT, TRANSFEMORAL N/A 12/12/2014  ? Procedure: TRANSCATHETER AORTIC VALVE REPLACEMENT, TRANSFEMORAL;  Surgeon: Burnell Blanks, MD;  Location: [place];  Service: Cardiovascular;  Laterality: N/A;  ? TRIGGER FINGER RELEASE  09/10/2012  ? Procedure: RELEASE TRIGGER  FINGER/A-1 PULLEY;  Surgeon: Mcarthur Rossetti, MD;  [facility];  Service: Orthopedics;  Laterality: Right;  Right Ring Finger  ? TUBAL LIGATION    ? ?Patient Active Problem List  ? Diagnosis Date Noted  ? Arthritis of left shoulder region   ? 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 Healthcare maintenance 11/17/2021  ? Stress reaction 11/17/2021  ? Spondylolisthesis, lumbar region 02/11/2021  ?  Class: Acute  ? Malignant neoplasm of upper-outer quadrant of right breast in female, estrogen receptor positive ([place]) 06/10/2018  ? Restless leg syndrome 02/04/2018  ? 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  ? Depression 08/06/2015  ? Hypertension associated with diabetes ([place])   ? Aortic stenosis   ? Chronic diastolic congestive heart failure ([place])   ? COPD (chronic obstructive pulmonary disease) ([place]) 09/21/2013  ?  Obstructive sleep apnea 09/21/2013  ? Nocturnal hypoxemia 09/12/2013  ? Hyperlipidemia 11/30/2012  ? History of pulmonary embolus (PE) 07/09/2012  ? Normocytic anemia 02/08/2012  ? Type 2 diabetes mellitus without complication, without long-term current use of insulin ([place]) 02/08/2012  ? Asthma 02/08/2012  ? GERD (gastroesophageal reflux disease)   ? CAD (coronary artery disease) 12/08/2011  ? ? ?REFERRING DIAG:  ?L89.373 (ICD-10-CM) - History of arthroplasty of left shoulder ? ?THERAPY DIAG:  ?Chronic left shoulder pain ? ?Muscle weakness (generalized) ? ?PERTINENT HISTORY:  ?HTN, DMII, MI, Anxiety/Depression, COPD, breast cancer ? ?PRECAUTIONS:  ?Shoulder - reverse TSA ? ?SUBJECTIVE:  ?Pt presents to PT with reports of increased L shoulder soreness. Believes she had overdone it over the weekend with HEP. Pt is ready to begin PT at this time. ? ?Pain: ?Are you having pain?  ?Yes: NPRS scale: 5/10 (10/10 at worst) ?Pain location: L shoulder ?Pain description: sharp  ?Aggravating factors: accidentally rolling on L side ?Relieving factors: ice, medication ? ?OBJECTIVE:   ?  ?PATIENT SURVEYS:  ?Quick Dash 68% disability ?  ?UE ROM: ?  ?AROM Right ?03/31/2022 Left ?03/31/2022  ?Shoulder flexion WFL 95 PROM  ?Shoulder extension      ?Shoulder abduction WFL 94 PROM  ?Shoulder intern

## 2022-04-14 NOTE — Patient Outreach (Signed)
?Medicaid Managed Care   ?Nurse Care Manager Note ? ?04/14/2022 ?Name:  [name] MRN:  [id number] DOB:  Jun 25, 1961 ? ?[name] is an 61 y.o. year old female who is a primary patient of [name], MD.  [facility] Managed Care Coordination team was consulted for assistance with:    ?Chronic healthcare management needs, HTN, CHF, DM, CAD, asthma, COPD, OSA, GERD, anxiety/depression, arthritis, HLD, h/o breast cancer ? ?Ms. Craker was given information about Medicaid Managed Care Coordination team services today. [name] Patient agreed to services and verbal consent obtained. ? ?Engaged with patient by telephone for initial visit in response to provider referral for case management and/or care coordination services.  ? ?Assessments/Interventions:  Review of past medical history, allergies, medications, health status, including review of consultants reports, laboratory and other test data, was performed as part of comprehensive evaluation and provision of chronic care management services. ? ?SDOH (Social Determinants of Health) assessments and interventions performed: ?SDOH Interventions   ? ?Flowsheet Row Most Recent Value  ?SDOH Interventions   ?Intimate Partner Violence Interventions Intervention Not Indicated  ? ?  ? ?Care Plan ? ?No Known Allergies ? ?Medications Reviewed Today   ? ? Reviewed by Gayla Medicus, RN (Registered Nurse) on 04/14/22 at 1104  Med List Status: <None>  ? ?Medication Order Taking? Sig Documenting Provider Last Dose Status Informant  ?Accu-Chek FastClix Lancets MISC [id number]  Check blood sugar two times a day Lorella Nimrod, MD  Active Self  ?amlodipine-atorvastatin (CADUET) 5-80 MG tablet [id number]  Take 1 tablet by mouth daily. [name], MD  Active Self  ?Ascorbic Acid (VITAMIN C) 1000 MG tablet [id number]  Take 1,000 mg by mouth daily. [provider]  Active Self  ?ASPIRIN ADULT LOW STRENGTH 81 MG EC tablet [id number]  TAKE 1  Tablet BY MOUTH ONCE DAILY Lorella Nimrod, MD  Active Self  ?Blood Glucose Monitoring Suppl (ACCU-CHEK GUIDE) w/Device KIT [id number]  1 each by Does not apply route 2 (two) times daily. Lorella Nimrod, MD  Active Self  ?busPIRone (BUSPAR) 5 MG tablet [id number]  TAKE 1 TABLET BY MOUTH THREE TIMES DAILY  ?Patient taking differently: 10 mg.  ? [name], MD  Active Self  ?clopidogrel (PLAVIX) 75 MG tablet [id number]  TAKE 1 TABLET BY MOUTH IN THE [name], MD  Active Self  ?docusate sodium (COLACE) 100 MG capsule [id number] No Take 1 capsule (100 mg total) by mouth 2 (two) times daily.  ?Patient not taking: Reported on 04/14/2022  ? [name], MD Not Taking Active Self  ?DULoxetine (CYMBALTA) 60 MG capsule [id number]  Take 1 capsule by mouth once daily [name], MD  Active Self  ?Evolocumab (REPATHA SURECLICK) 694 MG/ML SOAJ [id number]  INJECT 1 PEN INTO THE SKIN EVERY 14 DAYS Burnell Blanks, MD  Active Self  ?exemestane (AROMASIN) 25 MG tablet [id number]  Take 1 tablet (25 mg total) by mouth daily after breakfast. [name], MD  Active Self  ?ezetimibe (ZETIA) 10 MG tablet [id number]  Take 1 tablet (10 mg total) by mouth daily. [name], PA-C  Active   ?ferrous gluconate (FERGON) 324 MG tablet [id number] No Take 1 tablet (324 mg total) by mouth 2 (two) times daily with a meal.  ?Patient not taking: Reported on 04/14/2022  ? [name], MD Not Taking Active Self  ?  Discontinued 05/09/20 1429   ?Fluticasone-Umeclidin-Vilant (TRELEGY ELLIPTA) 100-62.5-25 MCG/ACT AEPB [id number]  INHALE 1 PUFF ONCE DAILY  Strength: 100-62.5-25 MCG/ACT Lacinda Axon, MD  Active Self  ?furosemide (LASIX) 40 MG tablet [id number]  Take 1 tablet (40 mg total) by mouth as needed for fluid or edema. [name], MD  Active Self  ?gabapentin (NEURONTIN) 300 MG capsule [id number]  TAKE 3 CAPSULES BY MOUTH AT BEDTIME AND 1 IN THE MORNING [name], MD  Active   ?glucose blood  (ACCU-CHEK GUIDE) test strip [id number]  Check blood sugar 2 times per day [name], MD  Active Self  ?Insulin Pen Needle 30G X 5 MM MISC [id number]  1 application by Does not apply route daily. [name], MD  Active Self  ?liraglutide (VICTOZA) 18 MG/3ML SOPN [id number]  INJECT 1.8 MG INTO THE SKIN ONCE DAILY ?Strength: 18 MG/3ML Lacinda Axon, MD  Active Self  ?losartan-hydrochlorothiazide (HYZAAR) 100-25 MG tablet [id number]  Take 1 tablet by mouth daily. [name], MD  Active Self  ?meloxicam (MOBIC) 15 MG tablet [id number] No Take 15 mg by mouth daily as needed for pain.  ?Patient not taking: Reported on 04/14/2022  ? [provider] Not Taking Active Self  ?methocarbamol (ROBAXIN) 500 MG tablet [id number]  Take 1 tablet (500 mg total) by mouth every 6 (six) hours as needed for muscle spasms. [name], MD  Active   ?metoprolol succinate (TOPROL XL) 50 MG 24 hr tablet [id number]  Take 1 tablet (50 mg total) by mouth daily. Take with or immediately following a meal. [name], MD  Active Self  ?Potassium 99 MG TABS [id number]  Take 99 mg by mouth daily. [provider]  Active Self  ?traMADol (ULTRAM) 50 MG tablet [id number]  Take 1 tablet (50 mg total) by mouth every 6 (six) hours as needed. Magnant, Gerrianne Scale, PA-C  Active   ?VENTOLIN HFA 108 (90 Base) MCG/ACT inhaler [id number]  INHALE 2 PUFFS BY MOUTH EVERY 6 HOURS AS NEEDED FOR WHEEZING FOR SHORTNESS OF Loyal Gambler, MD  Active   ? ?  ?  ? ?  ? ?Patient Active Problem List  ? Diagnosis Date Noted  ? Arthritis of left shoulder region   ? 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 Healthcare maintenance 11/17/2021  ? Stress reaction 11/17/2021  ? Spondylolisthesis, lumbar region 02/11/2021  ?  Class: Acute  ? Malignant neoplasm of upper-outer quadrant of right breast in female, estrogen receptor positive ([place]) 06/10/2018  ? Restless leg syndrome 02/04/2018  ? Complex atypical  endometrial hyperplasia 10/22/2016  ? Depression 08/06/2015  ? Hypertension associated with diabetes ([place])   ? Aortic stenosis   ? Chronic diastolic congestive heart failure ([place])   ? COPD (chronic obstructive pulmonary disease) ([place]) 09/21/2013  ? Obstructive sleep apnea 09/21/2013  ? Nocturnal hypoxemia 09/12/2013  ? Hyperlipidemia 11/30/2012  ? History of pulmonary embolus (PE) 07/09/2012  ? Normocytic anemia 02/08/2012  ? Type 2 diabetes mellitus without complication, without long-term current use of insulin ([place]) 02/08/2012  ? Asthma 02/08/2012  ? GERD (gastroesophageal reflux disease)   ? CAD (coronary artery disease) 12/08/2011  ? ?Conditions to be addressed/monitored per PCP order:  Chronic healthcare management needs, HTN, CHF, DM, CAD, asthma, COPD, OSA, GERD, anxiety/depression, arthritis, HLD, h/o breast cancer ? ?Care Plan : RN Care Manager Plan of Care  ?Updates made by Gayla Medicus, RN since 04/14/2022 12:00 AM  ?  ? ?Problem: Health Promotion or Disease Self-Management (General Plan of Care)   ?Priority: High  ?Onset Date: 04/14/2022  ?  ? ?  Long-Range Goal: Chronic Disease Management and Care Coordination Needs   ?Start Date: 04/14/2022  ?Expected End Date: 07/14/2022  ?This Visit's Progress: Not on track  ?Priority: High  ?Note:   ?Current Barriers:  ?Knowledge Deficits related to plan of care for management of Anxiety and Depression  ?Care Coordination needs related to appointment ?Chronic Disease Management support and education needs related to Anxiety and Depression  ? ?RNCM Clinical Goal(s):  ?Patient will verbalize understanding of plan for management of Anxiety and Depression as evidenced by patient report ?take all medications exactly as prescribed and will call provider for medication related questions as evidenced by patient report ?demonstrate understanding of rationale for each prescribed medication as evidenced by patient report ?attend all scheduled medical appointments  as evidenced by  patient report ?continue to work with RN Care Manager to address care management and care coordination needs related to  Anxiety and Depression as evidenced by adherence to CM Team Scheduled appointments ?work wi

## 2022-04-16 ENCOUNTER — Ambulatory Visit: Payer: Medicaid Other

## 2022-04-16 ENCOUNTER — Ambulatory Visit: Payer: Self-pay

## 2022-04-17 ENCOUNTER — Other Ambulatory Visit: Payer: Self-pay | Admitting: Licensed Clinical Social Worker

## 2022-04-17 DIAGNOSIS — F430 Acute stress reaction: Secondary | ICD-10-CM

## 2022-04-17 DIAGNOSIS — F3342 Major depressive disorder, recurrent, in full remission: Secondary | ICD-10-CM

## 2022-04-17 NOTE — Patient Outreach (Signed)
Medicaid Managed Care Social Work Note  04/17/2022 Name:  [name] MRN:  [id number] DOB:  05-29-1961  [name] is an 61 y.o. year old female who is a primary patient of Evlyn Kanner, MD.  The Medicaid Managed Care Coordination team was consulted for assistance with:  Mental Health Counseling and Resources  [name] was given information about Medicaid Managed Care Coordination team services today. [name] Patient agreed to services and verbal consent obtained.  Engaged with patient  for by telephone forinitial visit in response to referral for case management and/or care coordination services.   Assessments/Interventions:  Review of past medical history, allergies, medications, health status, including review of consultants reports, laboratory and other test data, was performed as part of comprehensive evaluation and provision of chronic care management services.  SDOH: (Social Determinant of Health) assessments and interventions performed: SDOH Interventions    Flowsheet Row Most Recent Value  SDOH Interventions   Stress Interventions Provide Counseling, Offered Community Wellness Resources  Depression Interventions/Treatment  Medication       Advanced Directives Status:  See Care Plan for related entries.  Care Plan                 No Known Allergies  Medications Reviewed Today     Reviewed by Eloy End, PT (Physical Therapist) on 04/14/22 at 1654  Med List Status: <None>   Medication Order Taking? Sig Documenting Provider Last Dose Status Informant  Accu-Chek FastClix Lancets MISC [id number] No Check blood sugar two times a day Arnetha Courser, MD Taking Active Self  amlodipine-atorvastatin (CADUET) 5-80 MG tablet [id number] No Take 1 tablet by mouth daily. Evlyn Kanner, MD 03/06/2022 0730 Active Self  Ascorbic Acid (VITAMIN C) 1000 MG tablet [id number] No Take 1,000 mg by mouth daily. [provider] Past Month Active  Self  ASPIRIN ADULT LOW STRENGTH 81 MG EC tablet [id number] No TAKE 1 Tablet BY MOUTH ONCE DAILY Arnetha Courser, MD 02/27/2022 Active Self  Blood Glucose Monitoring Suppl (ACCU-CHEK GUIDE) w/Device KIT [id number] No 1 each by Does not apply route 2 (two) times daily. Arnetha Courser, MD Taking Active Self  busPIRone (BUSPAR) 5 MG tablet [id number] No TAKE 1 TABLET BY MOUTH THREE TIMES DAILY  Patient taking differently: 10 mg.   Evlyn Kanner, MD 03/06/2022 0730 Active Self  clopidogrel (PLAVIX) 75 MG tablet [id number] No TAKE 1 TABLET BY MOUTH IN THE Wylie Hail, MD 02/27/2022 Active Self  docusate sodium (COLACE) 100 MG capsule [id number] No Take 1 capsule (100 mg total) by mouth 2 (two) times daily.  Patient not taking: Reported on 04/14/2022   Kerrin Champagne, MD Not Taking Active Self  DULoxetine (CYMBALTA) 60 MG capsule [id number] No Take 1 capsule by mouth once daily Evlyn Kanner, MD 03/06/2022 0730 Active Self  Evolocumab (REPATHA SURECLICK) 140 MG/ML SOAJ [id number] No INJECT 1 PEN INTO THE SKIN EVERY 14 DAYS Kathleene Hazel, MD Past Month Active Self  exemestane (AROMASIN) 25 MG tablet [id number] No Take 1 tablet (25 mg total) by mouth daily after breakfast. Rachel Moulds, MD 03/06/2022 0730 Active Self  ezetimibe (ZETIA) 10 MG tablet [id number] No Take 1 tablet (10 mg total) by mouth daily. Dyann Kief, PA-C 03/06/2022 0730 Active   ferrous gluconate (FERGON) 324 MG tablet [id number] No Take 1 tablet (324 mg total) by mouth 2 (two) times daily with a meal.  Patient not taking: Reported on 04/14/2022   Kerrin Champagne, MD Not Taking Active  Self  Discontinued 05/09/20 1429   Fluticasone-Umeclidin-Vilant (TRELEGY ELLIPTA) 100-62.5-25 MCG/ACT AEPB [id number] No INHALE 1 PUFF ONCE DAILY Strength: 100-62.5-25 MCG/ACT Steffanie Rainwater, MD 03/06/2022 0730 Active Self  furosemide (LASIX) 40 MG tablet [id number] No Take 1 tablet (40 mg total) by mouth as needed for fluid or edema. Eliezer Bottom, MD Past Week Active Self  gabapentin (NEURONTIN) 300 MG capsule [id number]  TAKE 3 CAPSULES BY MOUTH AT BEDTIME AND 1 IN THE MORNING Kerrin Champagne, MD  Active   glucose blood (ACCU-CHEK GUIDE) test strip [id number] No Check blood sugar 2 times per day Evlyn Kanner, MD Taking Active Self  Insulin Pen Needle 30G X 5 MM MISC [id number] No 1 application by Does not apply route daily. Evlyn Kanner, MD Taking Active Self  liraglutide (VICTOZA) 18 MG/3ML SOPN [id number] No INJECT 1.8 MG INTO THE SKIN ONCE DAILY Strength: 18 MG/3ML Steffanie Rainwater, MD 03/05/2022 Active Self  losartan-hydrochlorothiazide (HYZAAR) 100-25 MG tablet [id number] No Take 1 tablet by mouth daily. Evlyn Kanner, MD 03/05/2022 Active Self  meloxicam (MOBIC) 15 MG tablet [id number] No Take 15 mg by mouth daily as needed for pain.  Patient not taking: Reported on 04/14/2022   [provider] Not Taking Active Self  methocarbamol (ROBAXIN) 500 MG tablet [id number]  Take 1 tablet (500 mg total) by mouth every 6 (six) hours as needed for muscle spasms. Cammy Copa, MD  Active   metoprolol succinate (TOPROL XL) 50 MG 24 hr tablet [id number] No Take 1 tablet (50 mg total) by mouth daily. Take with or immediately following a meal. Evlyn Kanner, MD 03/06/2022 0730 Active Self  Potassium 99 MG TABS [id number] No Take 99 mg by mouth daily. [provider] Past Month Active Self  traMADol (ULTRAM) 50 MG tablet [id number]  Take 1 tablet (50 mg total) by mouth every 6 (six) hours as needed. 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   Healthcare maintenance 11/17/2021   Stress  reaction 11/17/2021   Spondylolisthesis, lumbar region 02/11/2021    Class: Acute   Malignant neoplasm of upper-outer quadrant of right breast in female, estrogen receptor positive (HCC) 06/10/2018   Restless leg syndrome 02/04/2018   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   Depression 08/06/2015   Hypertension associated with diabetes (HCC)    Aortic stenosis    Chronic diastolic congestive heart failure (HCC)    COPD (chronic obstructive pulmonary disease) (HCC) 09/21/2013   Obstructive sleep apnea 09/21/2013   Nocturnal hypoxemia 09/12/2013   Hyperlipidemia 11/30/2012   History of pulmonary embolus (PE) 07/09/2012   Normocytic anemia 02/08/2012   Type 2 diabetes mellitus without complication, without long-term current use of insulin (HCC) 02/08/2012   Asthma 02/08/2012   GERD (gastroesophageal reflux disease)    CAD (coronary artery disease) 12/08/2011    Conditions to be addressed/monitored per PCP order:  Anxiety and Depression  Care Plan : LCSW Plan of Care  Updates made by Gustavus Bryant, LCSW since 04/17/2022 12:00 AM     Problem: Depression Identification (Depression)      Long-Range Goal: I want  to start mental health treatment   Start Date: 04/17/2022  Priority: High  Note:   Priority: High  Timeframe:  Long-Range Goal Priority:  High Start Date:   04/17/22                Expected End Date:  ongoing                     Follow Up Date--05/21/22  - check out counseling - keep 90 percent of counseling appointments - schedule counseling appointment    Why is this important?             Beating depression may take some time.            If you don't feel better right away, don't give up on your treatment plan.    Current barriers:   Chronic Mental Health needs related to depression, stress and anxiety. Patient requires Support, Education, Resources, Referrals, Advocacy, and Care Coordination, in order to meet Unmet Mental Health Needs. Patient will  implement clinical interventions discussed today to decrease symptoms of depression and increase knowledge and/or ability of: coping skills. Mental Health Concerns and Social Isolation Patient lacks knowledge of available community counseling agencies and resources.  Clinical Goal(s): verbalize understanding of plan for management of Anxiety, Depression, and Stress and demonstrate a reduction in symptoms. Patient will connect with a provider for ongoing mental health treatment, increase coping skills, healthy habits, self-management skills, and stress reduction        Clinical Interventions:  Assessed patient's previous and current treatment, coping skills, support system and barriers to care. Patient provided history. Patient and spouse lived in a shared home with two other adults. Patient reports that this is a current and stable living situation.  Verbalization of feelings encouraged, motivational interviewing employed Emotional support provided, positive coping strategies explored Self care/establishing healthy boundaries emphasized Patient reports that she takes medication for anxiety and depression. She reports that this has alleviated some of symptoms but not all. She reports that she still has crying spells. She needs help with finding psychiatry and counseling. She was successful in identifying triggers to anxiety and depression symptoms, in addition, to healthy coping skills.  Patient reports significant worsening anxiety and depression impacting her ability to function appropriately and carry out daily task. Patient receives strong support from spouse but he does not have anxiety or depression and is unable to relate to her and her daily mental health struggles.  Patient is agreeable to referral to [facility] for counseling and psychiatry. [facility] LCSW made referral on 04/17/22. Patient will call [facility] tomorrow on 04/18/22 to schedule counseling and psychiatry appointments. Email sent to her with  [facility]'s contact information.  LCSW provided education on relaxation techniques such as meditation, deep breathing, massage, grounding exercises or yoga that can activate the body's relaxation response and ease symptoms of stress and anxiety. LCSW ask that when pt is struggling with difficult emotions and racing thoughts that they start this relaxation response process. LCSW provided extensive education on healthy coping skills for anxiety. SW used active and reflective listening, validated patient's feelings/concerns, and provided emotional support. Patient will work on implementing appropriate self-care habits into their daily routine such as: staying positive, writing a gratitude list, drinking water, staying active around the house, taking their medications as prescribed, combating negative thoughts or emotions and staying connected with their family and friends. Positive reinforcement provided for this decision to work on this. Patient was receptive to anxiety and  depression management coping skill education. She will try to increase her socialization over the next 90 days to decrease isolation.  Patient has problems with sleep disturbance. LCSW provided education on healthy sleep hygiene and what that looks like. LCSW encouraged patient to implement a night time routine into her schedule that works best for her and that she is able to maintain. Advised patient to implement deep breathing/grounding/meditation/self-care exercises into her nightly routine to combat racing thoughts at night. LCSW encouraged patient to wake up at the same time each day, make their sleeping environment comfortable, exercise when able, to limit naps and to not eat or drink anything right before bed.  Motivational Interviewing employed Depression screen reviewed  PHQ2/ PHQ9 completed Mindfulness or Relaxation training provided Active listening / Reflection utilized  Advance Care and HCPOA education provided Emotional  Support Provided Problem Solving /Task Center strategies reviewed Provided psychoeducation for mental health needs  Provided brief CBT  Reviewed mental health medications and discussed importance of compliance:  Quality of sleep assessed & Sleep Hygiene techniques promoted  Participation in counseling encouraged  Verbalization of feelings encouraged  Suicidal Ideation/Homicidal Ideation assessed: Patient denies SI/HI  Review resources, discussed options and provided patient information about  Mental Health Resources Inter-disciplinary care team collaboration (see longitudinal plan of care) Patient Goals/Self-Care Activities: Over the next 120 days Attend scheduled medical appointments Utilize healthy coping skills and supportive resources discussed Contact PCP with any questions or concerns Keep 90 percent of counseling appointments Call your insurance provider for more information about your Enhanced Benefits  Check out counseling resources provided  Begin personal counseling with LCSW, to reduce and manage symptoms of Depression and Stress, until well-established with mental health provider Accept all calls from representative with [facility] in an effort to establish ongoing mental health counseling and supportive services. Incorporate into daily practice - relaxation techniques, deep breathing exercises, and mindfulness meditation strategies. Talk about feelings with friends, family members, spiritual advisor, etc. Contact LCSW directly [phone]), if you have questions, need assistance, or if additional social work needs are identified between now and our next scheduled telephone outreach call. Call 988 for mental health hotline/crisis line if needed (24/7 available) Try techniques to reduce symptoms of anxiety/negative thinking (deep breathing, distraction, positive self talk, etc)  - develop a personal safety plan - develop a plan to deal with triggers like holidays,  anniversaries - exercise at least 2 to 3 times per week - have a plan for how to handle bad days - journal feelings and what helps to feel better or worse - spend time or talk with others at least 2 to 3 times per week - watch for early signs of feeling worse - begin personal counseling - call and visit an old friend - check out volunteer opportunities - join a support group - laugh; watch a funny movie or comedian - learn and use visualization or guided imagery - perform a random act of kindness - practice relaxation or meditation daily - start or continue a personal journal - practice positive thinking and self-talk -continue with compliance of taking medication      24- Hour Availability:    [facility]  [address] [place], [place] [phone] Crisis [phone]   [facility] [phone]   [place] [facility]  [phone]    [facility] [place]  [phone] (after hours)   Therapeutic Alternative/Mobile Crisis   [phone]   [place] National Suicide  Hotline  [phone] (TALK) OR 988   Call 911 or go to emergency room   [facility]  [phone]);  [place] and [employer]  [phone]); [place], [place], [place], [place], [place], [place], [place]        10 LITTLE Things To Do When You're Feeling Too Down To Do Anything  Take a shower. Even if you plan to stay in all day long and not see a soul, take a shower. It takes the most effort to hop in to the shower but once you do, you'll feel immediate results. It will wake you up and you'll be feeling much fresher (and cleaner too).  Brush and floss your teeth. Give your teeth a good brushing with a floss finish. It's a small task but it feels so good and you can check 'taking care of your health' off the list of things to do.  Do something small on your list. Most of [place] have some small  thing we would like to get done (load of laundry, sew a button, email a friend). Doing one of these things will make you feel like you've accomplished something.  Drink water. Drinking water is easy right? It's also really beneficial for your health so keep a glass beside you all day and take sips often. It gives you energy and prevents you from boredom eating.  Do some floor exercises. The last thing you want to do is exercise but it might be just the thing you need the most. Keep it simple and do exercises that involve sitting or laying on the floor. Even the smallest of exercises release chemicals in the brain that make you feel good. Yoga stretches or core exercises are going to make you feel good with minimal effort.  Make your bed. Making your bed takes a few minutes but it's productive and you'll feel relieved when it's done. An unmade bed is a huge visual reminder that you're having an unproductive day. Do it and consider it your housework for the day.  Put on some nice clothes. Take the sweatpants off even if you don't plan to go anywhere. Put on clothes that make you feel good. Take a look in the mirror so your brain recognizes the sweatpants have been replaced with clothes that make you look great. It's an instant confidence booster.  Wash the dishes. A pile of dirty dishes in the sink is a reflection of your mood. It's possible that if you wash up the dishes, your mood will follow suit. It's worth a try.  Cook a real meal. If you have the luxury to have a "do nothing" day, you have time to make a real meal for yourself. Make a meal that you love to eat. The process is good to get you out of the funk and the food will ensure you have more energy for tomorrow.  Write out your thoughts by hand. When you hand write, you stimulate your brain to focus on the moment that you're in so make yourself comfortable and write whatever comes into your mind. Put those thoughts out on paper so they  stop spinning around in your head. Those thoughts might be the very thing holding you down.  Patient Goals: Initial goal         Follow up:  Patient agrees to Care Plan and Follow-up.  Plan: The Managed Medicaid care management team will reach out to the patient again over the next 45 days.  Date/time of  next scheduled Social Work care management/care coordination outreach:  05/21/22 at 1:00 pm.  Dickie La, BSW, MSW, LCSW Managed Medicaid LCSW [facility]  [facility] [email or web address] Phone: [phone]

## 2022-04-17 NOTE — Patient Instructions (Signed)
Visit Information ? ?Ms. Merkle was given information about Medicaid Managed Care team care coordination services as a part of their [employer] Medicaid benefit. [name] verbally consented to engagement with the [facility] Managed Care team.  ? ?If you are experiencing a medical emergency, please call 911 or report to your local emergency department or urgent care.  ? ?If you have a non-emergency medical problem during routine business hours, please contact your provider's office and ask to speak with a nurse.  ? ?For questions related to your [facility] health plan, please call: [phone] or go here:[email or web address] ? ?If you would like to schedule transportation through your [facility] plan, please call the following number at least 2 days in advance of your appointment: [phone]. ? You can also use the MTM portal or MTM mobile app to manage your rides. For the portal, please go to [email or web address]. ? ?Call the [place] at [phone], at any time, 24 hours a day, 7 days a week. If you are in danger or need immediate medical attention call 911. ? ?If you would like help to quit smoking, call 1-800-QUIT-NOW (902)308-0128) OR Espa?ol: 1-855-D?jelo-Ya (787)411-0019) o para m?s informaci?n haga clic aqu? or Text READY to 200-400 to register via text ? ? ?Following is a copy of your plan of care:  ?Care Plan : [place]  ?Updates made by Greg Cutter, LCSW since 04/17/2022 12:00 AM  ?  ? ?Problem: Depression Identification (Depression)   ?  ? ?Long-Range Goal: I want to start mental health treatment   ?Start Date: 04/17/2022  ?Priority: High  ?Note:   ?Priority: High ? ?Timeframe:  Long-Range Goal ?Priority:  High ?Start Date:   04/17/22                ?Expected End Date:  ongoing                   ?  ?Follow Up Date--05/21/22 ? ?- check out counseling ?- keep 90 percent of counseling appointments ?- schedule counseling appointment  ?  ?Why is  this important?   ?          Beating depression may take some time.  ?          If you don't feel better right away, don't give up on your treatment plan.  ? ? Current barriers:   ?Chronic Mental Health needs related to depression, stress and anxiety. Patient requires Support, Education, Resources, Referrals, Advocacy, and Care Coordination, in order to meet Unmet Mental Health Needs. ?Patient will implement clinical interventions discussed today to decrease symptoms of depression and increase knowledge and/or ability of: coping skills. ?Mental Health Concerns and Social Isolation ?Patient lacks knowledge of available community counseling agencies and resources. ? ?Clinical Goal(s): verbalize understanding of plan for management of Anxiety, Depression, and Stress and demonstrate a reduction in symptoms. Patient will connect with a provider for ongoing mental health treatment, increase coping skills, healthy habits, self-management skills, and stress reduction     ?  Patient Goals/Self-Care Activities: Over the next 120 days ?Attend scheduled medical appointments ?Utilize healthy coping skills and supportive resources discussed ?Contact PCP with any questions or concerns ?Keep 90 percent of counseling appointments ?Call your insurance provider for more information about your Enhanced Benefits  ?Check out counseling resources provided  ?Begin personal counseling with LCSW, to reduce and manage symptoms of Depression and Stress, until well-established with mental health provider ?Accept all calls from representative  with [facility] in an effort to establish ongoing mental health counseling and supportive services. ?Incorporate into daily practice - relaxation techniques, deep breathing exercises, and mindfulness meditation strategies. ?Talk about feelings with friends, family members, spiritual advisor, etc. ?Contact LCSW directly [phone]), if you have questions, need assistance, or if additional social work needs  are identified between now and our next scheduled telephone outreach call. ?Call 988 for mental health hotline/crisis line if needed (24/7 available) ?Try techniques to reduce symptoms of anxiety/negative thinking (deep breathing, distraction, positive self talk, etc)  ?- develop a personal safety plan ?- develop a plan to deal with triggers like holidays, anniversaries ?- exercise at least 2 to 3 times per week ?- have a plan for how to handle bad days ?- journal feelings and what helps to feel better or worse ?- spend time or talk with others at least 2 to 3 times per week ?- watch for early signs of feeling worse ?- begin personal counseling ?- call and visit an old friend ?- check out volunteer opportunities ?- join a support group ?- laugh; watch a funny movie or comedian ?- learn and use visualization or guided imagery ?- perform a random act of kindness ?- practice relaxation or meditation daily ?- start or continue a personal journal ?- practice positive thinking and self-talk ?-continue with compliance of taking medication  ? ? ?  ?24- Hour Availability:  ?  ?[facility]  ?[place], [place] ?[place] [phone] ?Crisis [phone] ?  ?[facility] [employer] [phone] ?  ?[facility]  [phone]  ?  ?[place]  [phone] (after hours) ?  ?Therapeutic Alternative/Mobile Crisis   [phone] ?  ?[place] National Suicide Hotline  (323)688-3726 (TALK) OR 988 ?  ?Call 911 or go to emergency room ?  ?[employer]  [phone]);  [place] and [place]  ?  ?[facility]  ?([phone]); [place], [place], [place], [place], [place], [place], [place] ?  ? ?  ?  ?10 LITTLE Things To Do When You?re Feeling Too Down To Do Anything ? ?Take a shower. ?Even if you plan to stay in all day long and not see a soul, take a shower. It takes the most effort to hop in to the shower but once you do, you?ll feel  immediate results. It will wake you up and you?ll be feeling much fresher (and cleaner too). ? ?Brush and floss your teeth. ?Give your teeth a good brushing with a floss finish. It?s a small task but it feels so good and you can check ?taking care of your health? off the list of things to do. ? ?Do something small on your list. ?Most of [place] have some small thing we would like to get done (load of laundry, sew a button, email a friend). Doing one of these things will make you feel like you?ve accomplished something. ? ?Drink water. ?Drinking water is easy right? It?s also really beneficial for your health so keep a glass beside you all day and take sips often. It gives you energy and prevents you from boredom eating. ? ?Do some floor exercises. ?The last thing you want to do is exercise but it might be just the thing you need the most. Keep it simple and do exercises that involve sitting or laying on the floor. Even the smallest of exercises release chemicals in the brain that make you feel good. Yoga stretches or core exercises are going to  make you feel good with minimal effort. ? ?Make your bed. ?Making your bed takes a few minutes but it?s productive and you?ll feel relieved when it?s done. An unmade bed is a huge visual reminder that you?re having an unproductive day. Do it and consider it your housework for the day. ? ?Put on some nice clothes. ?Take the sweatpants off even if you don?t plan to go anywhere. Put on clothes that make you feel good. Take a look in the mirror so your brain recognizes the sweatpants have been replaced with clothes that make you look great. It?s an instant confidence booster. ? ?Wash the dishes. ?A pile of dirty dishes in the sink is a reflection of your mood. It?s possible that if you wash up the dishes, your mood will follow suit. It?s worth a try. ? ?Lacinda Axon a real meal. ?If you have the luxury to have a ?do nothing? day, you have time to make a real meal for yourself. Make a meal  that you love to eat. The process is good to get you out of the funk and the food will ensure you have more energy for tomorrow. ? ?Write out your thoughts by hand. ?When you hand write, you stimulate your br

## 2022-04-18 ENCOUNTER — Ambulatory Visit (INDEPENDENT_AMBULATORY_CARE_PROVIDER_SITE_OTHER): Payer: Medicaid Other | Admitting: Surgical

## 2022-04-18 ENCOUNTER — Encounter: Payer: Self-pay | Admitting: Surgical

## 2022-04-18 DIAGNOSIS — Z96612 Presence of left artificial shoulder joint: Secondary | ICD-10-CM

## 2022-04-18 MED ORDER — TRAMADOL HCL 50 MG PO TABS: 50.0000 mg | ORAL_TABLET | Freq: Two times a day (BID) | ORAL | 0 refills | Status: DC | PRN

## 2022-04-18 NOTE — Progress Notes (Signed)
? ?Post-Op Visit Note ?  ?Patient: [name]           ?Date of Birth: February 14, 1961           ?MRN: [id number] ?Visit Date: 04/18/2022 ?PCP: [name], MD ? ? ?Assessment & Plan: ? ?Chief Complaint:  ?Chief Complaint  ?Patient presents with  ? Left Shoulder - Routine Post Op  ? ?Visit Diagnoses:  ?1. History of arthroplasty of left shoulder   ? ? ?Plan: Patient is a 61 year old female who presents s/p left reverse shoulder arthroplasty on 03/06/2022.  She states that she is doing well overall and progressing.  She feels continual improvement and feels a lot better than she did 4 weeks ago.  She denies any fevers or chills.  No chest pain or shortness of breath.  Takes Tylenol for pain control and occasionally takes tramadol as well.  She notes soreness that depends on the weather and temperature.  Localizes most of her pain to the lateral aspect of the shoulder with occasional radiation to the elbow.  Denies any instability events. ? ?On exam, patient has 2+ radial pulse with intact EPL, FPL, finger abduction, finger adduction, pronation/supination, bicep, tricep, deltoid.  Axillary nerve is intact with deltoid firing.  Incision is well-healed without evidence of infection or dehiscence.  There is no asymmetric warmth over the shoulder compared with the contralateral side.  Patient is able to actively lift her arm with some pain but not much difficulty.  Range of motion 30 degrees external rotation, 100 degrees abduction, 130 degrees forward flexion. ? ?Plan is continue with physical therapy.  Her range of motion is progressing very well.  Follow-up with the office in 6 weeks for clinical recheck and likely final check with Dr. Marlou Sa at that time.  She states her right shoulder is beginning to bother her some so she may want to have surgery on that in the future.  For now she wants to focus on the left.  She will reach out if there are any new concerns or injuries in the meantime. ? ?Follow-Up  Instructions: No follow-ups on file.  ? ?Orders:  ?No orders of the defined types were placed in this encounter. ? ?No orders of the defined types were placed in this encounter. ? ? ?Imaging: ?No results found. ? ?PMFS History: ?Patient Active Problem List  ? Diagnosis Date Noted  ? Arthritis of left shoulder region   ? 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 Healthcare maintenance 11/17/2021  ? Stress reaction 11/17/2021  ? Spondylolisthesis, lumbar region 02/11/2021  ?  Class: Acute  ? Malignant neoplasm of upper-outer quadrant of right breast in female, estrogen receptor positive ([place]) 06/10/2018  ? Restless leg syndrome 02/04/2018  ? 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  ? Depression 08/06/2015  ? Hypertension associated with diabetes ([place])   ? Aortic stenosis   ? Chronic diastolic congestive heart failure ([place])   ? COPD (chronic obstructive pulmonary disease) ([place]) 09/21/2013  ? Obstructive sleep apnea 09/21/2013  ? Nocturnal hypoxemia 09/12/2013  ? Hyperlipidemia 11/30/2012  ? History of pulmonary embolus (PE) 07/09/2012  ? Normocytic anemia 02/08/2012  ? Type 2 diabetes mellitus without complication, without long-term current use of insulin ([place]) 02/08/2012  ? Asthma 02/08/2012  ? GERD (gastroesophageal reflux disease)   ? CAD (coronary artery disease) 12/08/2011  ? ?Past Medical History:  ?Diagnosis Date  ? Anemia   ? Anxiety   ? Aortic valve stenosis, severe   ? Arthritis   ?  PAIN AND SEVERE OA LEFT KNEE ; S/P RIGHT TKA ON 02/03/12; HAS LOWER BACK PAIN-UNABLE TO STAND MORE THAN 10 MIN; ARTHRITIS "ALL OVER"  ? Asthma   ? Blood transfusion   ? [facility]  ? Breast cancer in female [facility])   ? Right  ? CAD (coronary artery disease)   ? Cath 2010 with DES x 1 RCA-- PT'S CARDIOLOGIST IS DR. [place]  ? Chronic diastolic congestive heart failure ([place])   ? COPD (chronic obstructive pulmonary disease) ([place])   ? Depression   ? Diabetes mellitus DIAGNOSED IN2010  ? Dyspnea   ? with much  ambulation  ? Eczema   ? on back  ? Headache   ? migraines younger- rare now 02/07/21  ? Heart murmur   ? no current problems  ? History of hiatal hernia   ? History of kidney stones   ? passed or blasted  ? Hyperlipidemia   ? Hypertension   ? Morbid obesity with body mass index of 60.0-69.9 in adult [name])   ? Myocardial infarction [facility])   ? PT THINKS SHE WAS DX WITH MI AT THE TIME OF HEART STENTING  ? Neuromuscular disorder ([place])   ? bilateral arm/hands  ? Oxygen dependent   ? uses 3L oxygen night/prn  ? Personal history of radiation therapy   ? Pneumonia   ? Pulmonary embolism ([place]) 02/08/2012  ? S/P RT TOTAL KNEE ON 02/03/12--ON 02/08/12--DEVELOPED ACUTE SOB AND CHEST PAIN--AND DIAGNOSED WITH  PULMONARY EMBOLUS AND PNEUMONIA  ? Restless leg syndrome   ? Sleep apnea   ? uses 3 liters O2 at night as needed  ? Uterine fibroid   ? NO PROBLEMS AT PRESENT FROM THE FIBROIDS-STATES SHE IS POST MENOPAUSAL-LAST MENSES 2010 EXCEPT FOR EPISODE THIS YR OF BLEEDING RELATED TO FIBROIDS.  ? Weakness   ? BOTH HANDS - S/P BILATERAL CARPAL TUNNEL RELEASE--BUT STILL HAS WEAKNESS--OFTEN DROPS THINGS  ?  ?Family History  ?Problem Relation Age of Onset  ? Breast cancer Mother   ?     stage IV at diagnosis  ? Emphysema Mother   ?     smoked  ? Heart disease Mother   ? COPD Father   ?     smoked  ? Asthma Father   ? Heart disease Father   ? Cancer Brother   ?     Sinus  ?  ?Past Surgical History:  ?Procedure Laterality Date  ? BACK SURGERY  02/11/2021  ? Dr Louanne Skye  ? BREAST BIOPSY Right 06/04/2018  ? BREAST LUMPECTOMY Right 06/2018  ? BREAST LUMPECTOMY WITH RADIOACTIVE SEED AND SENTINEL LYMPH NODE BIOPSY Right 07/19/2018  ? Procedure: BREAST LUMPECTOMY WITH RADIOACTIVE SEED AND SENTINEL LYMPH NODE BIOPSY;  Surgeon: Alphonsa Overall, MD;  Location: [place];  Service: General;  Laterality: Right;  ? CARDIAC CATHETERIZATION    ? CARDIAC VALVE REPLACEMENT    ? 2017  ? CARPAL TUNNEL RELEASE    ? Bilateral  ? CHOLECYSTECTOMY    ? COLONOSCOPY    ?  CORONARY ANGIOPLASTY    ? 2010 has stent in place  ? CYSTOSCOPY W/ RETROGRADES Right 09/21/2013  ? Procedure: CYSTOSCOPY WITH RIGHT RETROGRADE PYELOGRAM RIGHT DOUBLE J STENT ;  Surgeon: Fredricka Bonine, MD;  Location: [facility];  Service: Urology;  Laterality: Right;  ? CYSTOSCOPY WITH URETEROSCOPY AND STENT PLACEMENT Right 10/25/2013  ? Procedure: CYSTOSCOPY RIGHT URETEROSCOPY HOLMIUM LASER LITHO AND STENT PLACEMENT;  Surgeon: Fredricka Bonine, MD;  Location: [facility];  Service: Urology;  Laterality: Right;  ? HERNIA REPAIR    ? INTRAOPERATIVE TRANSESOPHAGEAL ECHOCARDIOGRAM N/A 12/12/2014  ? Procedure: INTRAOPERATIVE TRANSESOPHAGEAL ECHOCARDIOGRAM;  Surgeon: Burnell Blanks, MD;  Location[facility] OR;  Service: Cardiovascular;  Laterality: N/A;  ? JOINT REPLACEMENT    ? bil total knees  ? KNEE ARTHROPLASTY  02/03/2012  ? Procedure: COMPUTER ASSISTED TOTAL KNEE ARTHROPLASTY;  Surgeon: Mcarthur Rossetti, MD;  Location: [place];  Service: Orthopedics;  Laterality: Right;  Right total knee arthroplasty  ? LEFT AND RIGHT HEART CATHETERIZATION WITH CORONARY ANGIOGRAM N/A 03/17/2013  ? Procedure: LEFT AND RIGHT HEART CATHETERIZATION WITH CORONARY ANGIOGRAM;  Surgeon: Burnell Blanks, MD;  Location: [facility] CATH LAB;  Service: Cardiovascular;  Laterality: N/A;  ? LEFT AND RIGHT HEART CATHETERIZATION WITH CORONARY/GRAFT ANGIOGRAM N/A 09/14/2014  ? Procedure: LEFT AND RIGHT HEART CATHETERIZATION WITH Beatrix Fetters;  Surgeon: Burnell Blanks, MD;  Location: [facility] CATH LAB;  Service: Cardiovascular;  Laterality: N/A;  ? REVERSE SHOULDER ARTHROPLASTY Left 03/06/2022  ? Procedure: LEFT SHOULDER REPLACEMENT APPLICATION OF WOUND VAC;  Surgeon: [name], MD;  Location: [place];  Service: Orthopedics;  Laterality: Left;  ? TEE WITHOUT CARDIOVERSION N/A 03/14/2013  ? Procedure: TRANSESOPHAGEAL ECHOCARDIOGRAM (TEE);  Surgeon: Lelon Perla, MD;  Location: [facility] ENDOSCOPY;  Service:  Cardiovascular;  Laterality: N/A;  ? TEE WITHOUT CARDIOVERSION N/A 11/14/2014  ? Procedure: TRANSESOPHAGEAL ECHOCARDIOGRAM (TEE);  Surgeon: Lelon Perla, MD;  Location: [place];  Service: Cardiovascular;  Laterality: N/A;

## 2022-04-21 ENCOUNTER — Ambulatory Visit: Payer: Medicaid Other

## 2022-04-21 ENCOUNTER — Telehealth: Payer: Self-pay

## 2022-04-21 NOTE — Therapy (Incomplete)
?OUTPATIENT PHYSICAL THERAPY TREATMENT NOTE ? ? ?Patient Name: [name] ?MRN: [id number] ?DOB:[birth year], 61 y.o., female ?Today's Date: 04/21/2022 ? ?PCP: [name], MD ?REFERRING PROVIDER: [name], MD ? ?END OF SESSION:  ? ? ? ? ?Past Medical History:  ?Diagnosis Date  ? Anemia   ? Anxiety   ? Aortic valve stenosis, severe   ? Arthritis   ? PAIN AND SEVERE OA LEFT KNEE ; S/P RIGHT TKA ON 02/03/12; HAS LOWER BACK PAIN-UNABLE TO STAND MORE THAN 10 MIN; ARTHRITIS "ALL OVER"  ? Asthma   ? Blood transfusion   ? 2013[facility]  ? Breast cancer in female [facility])   ? Right  ? CAD (coronary artery disease)   ? Cath 2010 with DES x 1 RCA-- PT'S CARDIOLOGIST IS DR. [place]  ? Chronic diastolic congestive heart failure ([place])   ? COPD (chronic obstructive pulmonary disease) ([place])   ? Depression   ? Diabetes mellitus DIAGNOSED IN2010  ? Dyspnea   ? with much ambulation  ? Eczema   ? on back  ? Headache   ? migraines younger- rare now 02/07/21  ? Heart murmur   ? no current problems  ? History of hiatal hernia   ? History of kidney stones   ? passed or blasted  ? Hyperlipidemia   ? Hypertension   ? Morbid obesity with body mass index of 60.0-69.9 in adult [facility])   ? Myocardial infarction [facility])   ? PT THINKS SHE WAS DX WITH MI AT THE TIME OF HEART STENTING  ? Neuromuscular disorder ([place])   ? bilateral arm/hands  ? Oxygen dependent   ? uses 3L oxygen night/prn  ? Personal history of radiation therapy   ? Pneumonia   ? Pulmonary embolism ([place]) 02/08/2012  ? S/P RT TOTAL KNEE ON 02/03/12--ON 02/08/12--DEVELOPED ACUTE SOB AND CHEST PAIN--AND DIAGNOSED WITH  PULMONARY EMBOLUS AND PNEUMONIA  ? Restless leg syndrome   ? Sleep apnea   ? uses 3 liters O2 at night as needed  ? Uterine fibroid   ? NO PROBLEMS AT PRESENT FROM THE FIBROIDS-STATES SHE IS POST MENOPAUSAL-LAST MENSES 2010 EXCEPT FOR EPISODE THIS YR OF BLEEDING RELATED TO FIBROIDS.  ? Weakness   ? BOTH HANDS - S/P BILATERAL CARPAL TUNNEL RELEASE--BUT STILL HAS  WEAKNESS--OFTEN DROPS THINGS  ? ?Past Surgical History:  ?Procedure Laterality Date  ? BACK SURGERY  02/11/2021  ? Dr Louanne Skye  ? BREAST BIOPSY Right 06/04/2018  ? BREAST LUMPECTOMY Right 06/2018  ? BREAST LUMPECTOMY WITH RADIOACTIVE SEED AND SENTINEL LYMPH NODE BIOPSY Right 07/19/2018  ? Procedure: BREAST LUMPECTOMY WITH RADIOACTIVE SEED AND SENTINEL LYMPH NODE BIOPSY;  Surgeon: Alphonsa Overall, MD;  Location: [place];  Service: General;  Laterality: Right;  ? CARDIAC CATHETERIZATION    ? CARDIAC VALVE REPLACEMENT    ? 2017  ? CARPAL TUNNEL RELEASE    ? Bilateral  ? CHOLECYSTECTOMY    ? COLONOSCOPY    ? CORONARY ANGIOPLASTY    ? 2010 has stent in place  ? CYSTOSCOPY W/ RETROGRADES Right 09/21/2013  ? Procedure: CYSTOSCOPY WITH RIGHT RETROGRADE PYELOGRAM RIGHT DOUBLE J STENT ;  Surgeon: Fredricka Bonine, MD;  [facility];  Service: Urology;  Laterality: Right;  ? CYSTOSCOPY WITH URETEROSCOPY AND STENT PLACEMENT Right 10/25/2013  ? Procedure: CYSTOSCOPY RIGHT URETEROSCOPY HOLMIUM LASER LITHO AND STENT PLACEMENT;  Surgeon: Fredricka Bonine, MD;  [facility];  Service: Urology;  Laterality: Right;  ? HERNIA REPAIR    ?  INTRAOPERATIVE TRANSESOPHAGEAL ECHOCARDIOGRAM N/A 12/12/2014  ? Procedure: INTRAOPERATIVE TRANSESOPHAGEAL ECHOCARDIOGRAM;  Surgeon: Burnell Blanks, MD;  Location: [facility] OR;  Service: Cardiovascular;  Laterality: N/A;  ? JOINT REPLACEMENT    ? bil total knees  ? KNEE ARTHROPLASTY  02/03/2012  ? Procedure: COMPUTER ASSISTED TOTAL KNEE ARTHROPLASTY;  Surgeon: Mcarthur Rossetti, MD;  Location: [place];  Service: Orthopedics;  Laterality: Right;  Right total knee arthroplasty  ? LEFT AND RIGHT HEART CATHETERIZATION WITH CORONARY ANGIOGRAM N/A 03/17/2013  ? Procedure: LEFT AND RIGHT HEART CATHETERIZATION WITH CORONARY ANGIOGRAM;  Surgeon: Burnell Blanks, MD;  Location: [facility] CATH LAB;  Service: Cardiovascular;  Laterality: N/A;  ? LEFT AND RIGHT HEART CATHETERIZATION WITH  CORONARY/GRAFT ANGIOGRAM N/A 09/14/2014  ? Procedure: LEFT AND RIGHT HEART CATHETERIZATION WITH Beatrix Fetters;  Surgeon: Burnell Blanks, MD;  Location[facility] LAB;  Service: Cardiovascular;  Laterality: N/A;  ? REVERSE SHOULDER ARTHROPLASTY Left 03/06/2022  ? Procedure: LEFT SHOULDER REPLACEMENT APPLICATION OF WOUND VAC;  Surgeon: [name], MD;  Location: [place];  Service: Orthopedics;  Laterality: Left;  ? TEE WITHOUT CARDIOVERSION N/A 03/14/2013  ? Procedure: TRANSESOPHAGEAL ECHOCARDIOGRAM (TEE);  Surgeon: Lelon Perla, MD;  Location: [name] ENDOSCOPY;  Service: Cardiovascular;  Laterality: N/A;  ? TEE WITHOUT CARDIOVERSION N/A 11/14/2014  ? Procedure: TRANSESOPHAGEAL ECHOCARDIOGRAM (TEE);  Surgeon: Lelon Perla, MD;  Location: [facility] ENDOSCOPY;  Service: Cardiovascular;  Laterality: N/A;  ? TONSILLECTOMY    ? maybe as a child- does not know  ? TOTAL KNEE ARTHROPLASTY  09/10/2012  ? Procedure: TOTAL KNEE ARTHROPLASTY;  Surgeon: Mcarthur Rossetti, MD;  [facility];  Service: Orthopedics;  Laterality: Left;  ? TOTAL KNEE REVISION Right 07/15/2013  ? Procedure: REVISION ARTHROPLASTY RIGHT KNEE;  Surgeon: Mcarthur Rossetti, MD;  [facility];  Service: Orthopedics;  Laterality: Right;  ? TRANSCATHETER AORTIC VALVE REPLACEMENT, TRANSFEMORAL N/A 12/12/2014  ? Procedure: TRANSCATHETER AORTIC VALVE REPLACEMENT, TRANSFEMORAL;  Surgeon: Burnell Blanks, MD;  Location: [place];  Service: Cardiovascular;  Laterality: N/A;  ? TRIGGER FINGER RELEASE  09/10/2012  ? Procedure: RELEASE TRIGGER FINGER/A-1 PULLEY;  Surgeon: Mcarthur Rossetti, MD;  [facility];  Service: Orthopedics;  Laterality: Right;  Right Ring Finger  ? TUBAL LIGATION    ? ?Patient Active Problem List  ? Diagnosis Date Noted  ? Arthritis of left shoulder region   ? 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 Healthcare maintenance 11/17/2021  ? Stress reaction 11/17/2021  ?  Spondylolisthesis, lumbar region 02/11/2021  ?  Class: Acute  ? Malignant neoplasm of upper-outer quadrant of right breast in female, estrogen receptor positive ([place]) 06/10/2018  ? Restless leg syndrome 02/04/2018  ? 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  ? Depression 08/06/2015  ? Hypertension associated with diabetes ([place])   ? Aortic stenosis   ? Chronic diastolic congestive heart failure ([place])   ? COPD (chronic obstructive pulmonary disease) ([place]) 09/21/2013  ? Obstructive sleep apnea 09/21/2013  ? Nocturnal hypoxemia 09/12/2013  ? Hyperlipidemia 11/30/2012  ? History of pulmonary embolus (PE) 07/09/2012  ? Normocytic anemia 02/08/2012  ? Type 2 diabetes mellitus without complication, without long-term current use of insulin ([place]) 02/08/2012  ? Asthma 02/08/2012  ? GERD (gastroesophageal reflux disease)   ? CAD (coronary artery disease) 12/08/2011  ? ? ?REFERRING DIAG:  ?U13.244 (ICD-10-CM) - History of arthroplasty of left shoulder ? ?THERAPY DIAG:  ?No diagnosis found. ? ?PERTINENT HISTORY:  ?HTN, DMII, MI, Anxiety/Depression, COPD, breast  cancer ? ?PRECAUTIONS:  ?Shoulder - Reverse TSA ? ?SUBJECTIVE:  ?Pt presents to PT with reports of increased L shoulder soreness. Believes she had overdone it over the weekend with HEP. Pt is ready to begin PT at this time. ? ?Pain: ?Are you having pain?  ?Yes: NPRS scale: 5/10 (10/10 at worst) ?Pain location: L shoulder ?Pain description: sharp  ?Aggravating factors: accidentally rolling on L side ?Relieving factors: ice, medication ? ?OBJECTIVE:  ?  ?PATIENT SURVEYS:  ?Quick Dash 68% disability ?  ?UE ROM: ?  ?AROM Right ?03/31/2022 Left ?03/31/2022  ?Shoulder flexion WFL 95 PROM  ?Shoulder extension      ?Shoulder abduction WFL 94 PROM  ?Shoulder internal rotation WFL 40 PROM  ?Shoulder external rotation WFL 10 PROM  ?Elbow flexion      ?Elbow extension      ?Wrist flexion      ?Wrist extension      ?(Blank rows = not tested) ?    ?TODAY'S TREATMENT:  ?[facility]  PT Treatment: DATE: 04/21/2022 ?Therapeutic Exercise: ?Seated scapular retraction 2x10 ?Seated ER cane AAROM 2x10 L  ?Bicep curl 2x15 1# ?Supine shoulder flex AAROM with dow 2x10 ?Pendulums x 20 cw/ccw ?Sitting L shoulder ER

## 2022-04-21 NOTE — Telephone Encounter (Signed)
PT called and spoke with patient's daughter. Patient was not feeling well and they forgot to call and cancel appointment.  ? ?Ward Chatters, PT ?04/21/22 5:05 PM ? ?

## 2022-04-22 ENCOUNTER — Other Ambulatory Visit: Payer: Self-pay | Admitting: Internal Medicine

## 2022-04-22 DIAGNOSIS — J441 Chronic obstructive pulmonary disease with (acute) exacerbation: Secondary | ICD-10-CM

## 2022-04-24 ENCOUNTER — Ambulatory Visit: Payer: Medicaid Other

## 2022-04-24 DIAGNOSIS — Z96612 Presence of left artificial shoulder joint: Secondary | ICD-10-CM | POA: Diagnosis not present

## 2022-04-24 DIAGNOSIS — M542 Cervicalgia: Secondary | ICD-10-CM

## 2022-04-24 DIAGNOSIS — G8929 Other chronic pain: Secondary | ICD-10-CM | POA: Diagnosis not present

## 2022-04-24 DIAGNOSIS — M6281 Muscle weakness (generalized): Secondary | ICD-10-CM | POA: Diagnosis not present

## 2022-04-24 DIAGNOSIS — M25512 Pain in left shoulder: Secondary | ICD-10-CM | POA: Diagnosis not present

## 2022-04-24 NOTE — Therapy (Signed)
?OUTPATIENT PHYSICAL THERAPY TREATMENT NOTE ? ? ?Patient Name: [name] ?MRN: [id number] ?DOB:September 29, 1961, 61 y.o., female ?Today's Date: 04/24/2022 ? ?PCP: [name], MD ?REFERRING PROVIDER: [name], MD ? ?END OF SESSION:  ? PT End of Session - 04/24/22 1614   ? ? Visit Number [id number]   ? Number of Visits 20   ? Date for PT Re-Evaluation 06/23/22   ? Authorization Type [employer]   ? PT Start Time 1615   ? PT Stop Time 8119   ? PT Time Calculation (min) 30 min   ? Activity Tolerance Patient tolerated treatment well   ? Behavior During Therapy [facility] for tasks assessed/performed   ? ?  ?  ? ?  ? ? ? ? ?Past Medical History:  ?Diagnosis Date  ? Anemia   ? Anxiety   ? Aortic valve stenosis, severe   ? Arthritis   ? PAIN AND SEVERE OA LEFT KNEE ; S/P RIGHT TKA ON 02/03/12; HAS LOWER BACK PAIN-UNABLE TO STAND MORE THAN 10 MIN; ARTHRITIS "ALL OVER"  ? Asthma   ? Blood transfusion   ? 2013[facility]  ? Breast cancer in female [facility])   ? Right  ? CAD (coronary artery disease)   ? Cath 2010 with DES x 1 RCA-- PT'S CARDIOLOGIST IS DR. [place]  ? Chronic diastolic congestive heart failure ([place])   ? COPD (chronic obstructive pulmonary disease) ([place])   ? Depression   ? Diabetes mellitus DIAGNOSED IN2010  ? Dyspnea   ? with much ambulation  ? Eczema   ? on back  ? Headache   ? migraines younger- rare now 02/07/21  ? Heart murmur   ? no current problems  ? History of hiatal hernia   ? History of kidney stones   ? passed or blasted  ? Hyperlipidemia   ? Hypertension   ? Morbid obesity with body mass index of 60.0-69.9 in adult [facility])   ? Myocardial infarction [facility])   ? PT THINKS SHE WAS DX WITH MI AT THE TIME OF HEART STENTING  ? Neuromuscular disorder ([place])   ? bilateral arm/hands  ? Oxygen dependent   ? uses 3L oxygen night/prn  ? Personal history of radiation therapy   ? Pneumonia   ? Pulmonary embolism ([place]) 02/08/2012  ? S/P RT TOTAL KNEE ON 02/03/12--ON 02/08/12--DEVELOPED ACUTE SOB AND CHEST PAIN--AND DIAGNOSED  WITH  PULMONARY EMBOLUS AND PNEUMONIA  ? Restless leg syndrome   ? Sleep apnea   ? uses 3 liters O2 at night as needed  ? Uterine fibroid   ? NO PROBLEMS AT PRESENT FROM THE FIBROIDS-STATES SHE IS POST MENOPAUSAL-LAST MENSES 2010 EXCEPT FOR EPISODE THIS YR OF BLEEDING RELATED TO FIBROIDS.  ? Weakness   ? BOTH HANDS - S/P BILATERAL CARPAL TUNNEL RELEASE--BUT STILL HAS WEAKNESS--OFTEN DROPS THINGS  ? ?Past Surgical History:  ?Procedure Laterality Date  ? BACK SURGERY  02/11/2021  ? Dr Louanne Skye  ? BREAST BIOPSY Right 06/04/2018  ? BREAST LUMPECTOMY Right 06/2018  ? BREAST LUMPECTOMY WITH RADIOACTIVE SEED AND SENTINEL LYMPH NODE BIOPSY Right 07/19/2018  ? Procedure: BREAST LUMPECTOMY WITH RADIOACTIVE SEED AND SENTINEL LYMPH NODE BIOPSY;  Surgeon: Alphonsa Overall, MD;  Location: [place];  Service: General;  Laterality: Right;  ? CARDIAC CATHETERIZATION    ? CARDIAC VALVE REPLACEMENT    ? 2017  ? CARPAL TUNNEL RELEASE    ? Bilateral  ? CHOLECYSTECTOMY    ? COLONOSCOPY    ? CORONARY ANGIOPLASTY    ?  2010 has stent in place  ? CYSTOSCOPY W/ RETROGRADES Right 09/21/2013  ? Procedure: CYSTOSCOPY WITH RIGHT RETROGRADE PYELOGRAM RIGHT DOUBLE J STENT ;  Surgeon: Fredricka Bonine, MD;  [facility];  Service: Urology;  Laterality: Right;  ? CYSTOSCOPY WITH URETEROSCOPY AND STENT PLACEMENT Right 10/25/2013  ? Procedure: CYSTOSCOPY RIGHT URETEROSCOPY HOLMIUM LASER LITHO AND STENT PLACEMENT;  Surgeon: Fredricka Bonine, MD;  [facility];  Service: Urology;  Laterality: Right;  ? HERNIA REPAIR    ? INTRAOPERATIVE TRANSESOPHAGEAL ECHOCARDIOGRAM N/A 12/12/2014  ? Procedure: INTRAOPERATIVE TRANSESOPHAGEAL ECHOCARDIOGRAM;  Surgeon: Burn[place] Blanks, MD;  Location: [facility] OR;  Service: Cardiovascular;  Laterality: N/A;  ? JOINT REPLACEMENT    ? bil total knees  ? KNEE ARTHROPLASTY  02/03/2012  ? Procedure: COMPUTER ASSISTED TOTAL KNEE ARTHROPLASTY;  Surgeon: Mcarthur Rossetti, MD;  Location: [place];  Service:  Orthopedics;  Laterality: Right;  Right total knee arthroplasty  ? LEFT AND RIGHT HEART CATHETERIZATION WITH CORONARY ANGIOGRAM N/A 03/17/2013  ? Procedure: LEFT AND RIGHT HEART CATHETERIZATION WITH CORONARY ANGIOGRAM;  Surgeon: Burnell Blanks, MD;  Location: [facility] CATH LAB;  Service: Cardiovascular;  Laterality: N/A;  ? LEFT AND RIGHT HEART CATHETERIZATION WITH CORONARY/GRAFT ANGIOGRAM N/A 09/14/2014  ? Procedure: LEFT AND RIGHT HEART CATHETERIZATION WITH Beatrix Fetters;  Surgeon: Burnell Blanks, MD;  Location: [facility] CATH LAB;  Service: Cardiovascular;  Laterality: N/A;  ? REVERSE SHOULDER ARTHROPLASTY Left 03/06/2022  ? Procedure: LEFT SHOULDER REPLACEMENT APPLICATION OF WOUND VAC;  Surgeon: [name], MD;  Location: [place];  Service: Orthopedics;  Laterality: Left;  ? TEE WITHOUT CARDIOVERSION N/A 03/14/2013  ? Procedure: TRANSESOPHAGEAL ECHOCARDIOGRAM (TEE);  Surgeon: Lelon Perla, MD;  Location: [facility] ENDOSCOPY;  Service: Cardiovascular;  Laterality: N/A;  ? TEE WITHOUT CARDIOVERSION N/A 11/14/2014  ? Procedure: TRANSESOPHAGEAL ECHOCARDIOGRAM (TEE);  Surgeon: Lelon Perla, MD;  Location: [facility] ENDOSCOPY;  Service: Cardiovascular;  Laterality: N/A;  ? TONSILLECTOMY    ? maybe as a child- does not know  ? TOTAL KNEE ARTHROPLASTY  09/10/2012  ? Procedure: TOTAL KNEE ARTHROPLASTY;  Surgeon: Mcarthur Rossetti, MD;  [facility];  Service: Orthopedics;  Laterality: Left;  ? TOTAL KNEE REVISION Right 07/15/2013  ? Procedure: REVISION ARTHROPLASTY RIGHT KNEE;  Surgeon: Mcarthur Rossetti, MD;  [facility];  Service: Orthopedics;  Laterality: Right;  ? TRANSCATHETER AORTIC VALVE REPLACEMENT, TRANSFEMORAL N/A 12/12/2014  ? Procedure: TRANSCATHETER AORTIC VALVE REPLACEMENT, TRANSFEMORAL;  Surgeon: Burn[place] Blanks, MD;  Location: [place];  Service: Cardiovascular;  Laterality: N/A;  ? TRIGGER FINGER RELEASE  09/10/2012  ? Procedure: RELEASE TRIGGER FINGER/A-1 PULLEY;   Surgeon: Mcarthur Rossetti, MD;  [facility];  Service: Orthopedics;  Laterality: Right;  Right Ring Finger  ? TUBAL LIGATION    ? ?Patient Active Problem List  ? Diagnosis Date Noted  ? Arthritis of left shoulder region   ? 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 Healthcare maintenance 11/17/2021  ? Stress reaction 11/17/2021  ? Spondylolisthesis, lumbar region 02/11/2021  ?  Class: Acute  ? Malignant neoplasm of upper-outer quadrant of right breast in female, estrogen receptor positive ([place]) 06/10/2018  ? Restless leg syndrome 02/04/2018  ? 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  ? Depression 08/06/2015  ? Hypertension associated with diabetes ([place])   ? Aortic stenosis   ? Chronic diastolic congestive heart failure ([place])   ? COPD (chronic obstructive pulmonary disease) ([place]) 09/21/2013  ? Obstructive sleep apnea 09/21/2013  ?  Nocturnal hypoxemia 09/12/2013  ? Hyperlipidemia 11/30/2012  ? History of pulmonary embolus (PE) 07/09/2012  ? Normocytic anemia 02/08/2012  ? Type 2 diabetes mellitus without complication, without long-term current use of insulin ([place]) 02/08/2012  ? Asthma 02/08/2012  ? GERD (gastroesophageal reflux disease)   ? CAD (coronary artery disease) 12/08/2011  ? ? ?REFERRING DIAG:  ?V78.469 (ICD-10-CM) - History of arthroplasty of left shoulder ? ?THERAPY DIAG:  ?Chronic left shoulder pain ? ?Muscle weakness (generalized) ? ?Cervicalgia ? ?PERTINENT HISTORY:  ?HTN, DMII, MI, Anxiety/Depression, COPD, breast cancer ? ?PRECAUTIONS:  ?Shoulder - Reverse TSA ? ?SUBJECTIVE:  ?Pt presents to PT with reports of decreased shoulder pain. Has been compliant with HEP with no adverse effect. Pt is ready to begin PT at this time.  ? ?Pain: ?Are you having pain?  ?Yes: NPRS scale: 5/10 (10/10 at worst) ?Pain location: L shoulder ?Pain description: sharp  ?Aggravating factors: accidentally rolling on L side ?Relieving factors: ice, medication ? ?OBJECTIVE:  ?  ?PATIENT  SURVEYS:  ?Quick Dash 68% disability ?  ?UE ROM: ?  ?AROM Right ?03/31/2022 Left ?03/31/2022  ?Shoulder flexion WFL 95 PROM  ?Shoulder extension      ?Shoulder abduction WFL 94 PROM  ?Shoulder internal rotation WFL 40 P

## 2022-04-25 ENCOUNTER — Other Ambulatory Visit: Payer: Self-pay | Admitting: Cardiovascular Disease

## 2022-04-28 ENCOUNTER — Ambulatory Visit: Payer: Medicaid Other | Attending: Specialist | Admitting: Physical Therapy

## 2022-04-28 ENCOUNTER — Encounter: Payer: Self-pay | Admitting: Physical Therapy

## 2022-04-28 ENCOUNTER — Other Ambulatory Visit: Payer: Self-pay

## 2022-04-28 DIAGNOSIS — M6281 Muscle weakness (generalized): Secondary | ICD-10-CM | POA: Insufficient documentation

## 2022-04-28 DIAGNOSIS — M25512 Pain in left shoulder: Secondary | ICD-10-CM | POA: Diagnosis not present

## 2022-04-28 DIAGNOSIS — G8929 Other chronic pain: Secondary | ICD-10-CM | POA: Diagnosis not present

## 2022-04-28 DIAGNOSIS — Z419 Encounter for procedure for purposes other than remedying health state, unspecified: Secondary | ICD-10-CM | POA: Diagnosis not present

## 2022-04-28 NOTE — Therapy (Addendum)
OUTPATIENT PHYSICAL THERAPY TREATMENT NOTE  DISCHARGE   Patient Name: [name] MRN: [id number] DOB:July 19, 1961, 61 y.o., female Today's Date: 04/28/2022  PCP: [name], MD REFERRING PROVIDER: [name], MD  END OF SESSION:    PT End of Session - 04/28/22 1622     Visit Number 5    Number of Visits 20    Date for PT Re-Evaluation 06/23/22    Authorization Type [employer]    Authorization Time Period 03/31/2022 - 05/30/2022    Authorization - Visit Number 5    Authorization - Number of Visits 12    PT Start Time 3016    PT Stop Time 1655    PT Time Calculation (min) 40 min    Activity Tolerance Patient tolerated treatment well    Behavior During Therapy WFL for tasks assessed/performed                Past Medical History:  Diagnosis Date   Anemia    Anxiety    Aortic valve stenosis, severe    Arthritis    PAIN AND SEVERE OA LEFT KNEE ; S/P RIGHT TKA ON 02/03/12; HAS LOWER BACK PAIN-UNABLE TO STAND MORE THAN 10 MIN; ARTHRITIS "ALL OVER"   Asthma    Blood transfusion    2013[facility]   Breast cancer in female [facility])    Right   CAD (coronary artery disease)    Cath 2010 with DES x 1 RCA-- PT'S CARDIOLOGIST IS DR. MCALHANY   Chronic diastolic congestive heart failure (HCC)    COPD (chronic obstructive pulmonary disease) (HCC)    Depression    Diabetes mellitus DIAGNOSED IN2010   Dyspnea    with much ambulation   Eczema    on back   Headache    migraines younger- rare now 02/07/21   Heart murmur    no current problems   History of hiatal hernia    History of kidney stones    passed or blasted   Hyperlipidemia    Hypertension    Morbid obesity with body mass index of 60.0-69.9 in adult [facility])    Myocardial infarction ([place])    PT THINKS SHE WAS DX WITH MI AT THE TIME OF HEART STENTING   Neuromuscular disorder (HCC)    bilateral arm/hands   Oxygen dependent    uses 3L oxygen night/prn   Personal history of radiation therapy     Pneumonia    Pulmonary embolism ([place]) 02/08/2012   S/P RT TOTAL KNEE ON 02/03/12--ON 02/08/12--DEVELOPED ACUTE SOB AND CHEST PAIN--AND DIAGNOSED WITH  PULMONARY EMBOLUS AND PNEUMONIA   Restless leg syndrome    Sleep apnea    uses 3 liters O2 at night as needed   Uterine fibroid    NO PROBLEMS AT PRESENT FROM THE FIBROIDS-STATES SHE IS POST MENOPAUSAL-LAST MENSES 2010 EXCEPT FOR EPISODE THIS YR OF BLEEDING RELATED TO FIBROIDS.   Weakness    BOTH HANDS - S/P BILATERAL CARPAL TUNNEL RELEASE--BUT STILL HAS WEAKNESS--OFTEN DROPS THINGS   Past Surgical History:  Procedure Laterality Date   BACK SURGERY  02/11/2021   Dr Louanne Skye   BREAST BIOPSY Right 06/04/2018   BREAST LUMPECTOMY Right 06/2018   BREAST LUMPECTOMY WITH RADIOACTIVE SEED AND SENTINEL LYMPH NODE BIOPSY Right 07/19/2018   Procedure: BREAST LUMPECTOMY WITH RADIOACTIVE SEED AND SENTINEL LYMPH NODE BIOPSY;  Surgeon: Alphonsa Overall, MD;  Location: [place];  Service: General;  Laterality: Right;   [place]  2017   CARPAL TUNNEL RELEASE     Bilateral   CHOLECYSTECTOMY     COLONOSCOPY     CORONARY ANGIOPLASTY     2010 has stent in place   CYSTOSCOPY W/ RETROGRADES Right 09/21/2013   Procedure: CYSTOSCOPY WITH RIGHT RETROGRADE PYELOGRAM RIGHT DOUBLE J STENT ;  Surgeon: Fredricka Bonine, MD;  [facility];  Service: Urology;  Laterality: Right;   CYSTOSCOPY WITH URETEROSCOPY AND STENT PLACEMENT Right 10/25/2013   Procedure: CYSTOSCOPY RIGHT URETEROSCOPY HOLMIUM LASER LITHO AND STENT PLACEMENT;  Surgeon: Fredricka Bonine, MD;  [facility];  Service: Urology;  Laterality: Right;   HERNIA REPAIR     INTRAOPERATIVE TRANSESOPHAGEAL ECHOCARDIOGRAM N/A 12/12/2014   Procedure: INTRAOPERATIVE TRANSESOPHAGEAL ECHOCARDIOGRAM;  Surgeon: Burnell Blanks, MD;  Location: [place];  Service: Cardiovascular;  Laterality: N/A;   JOINT REPLACEMENT     bil total knees   KNEE ARTHROPLASTY   02/03/2012   Procedure: COMPUTER ASSISTED TOTAL KNEE ARTHROPLASTY;  Surgeon: Mcarthur Rossetti, MD;  Location: [place];  Service: Orthopedics;  Laterality: Right;  Right total knee arthroplasty   LEFT AND RIGHT HEART CATHETERIZATION WITH CORONARY ANGIOGRAM N/A 03/17/2013   Procedure: LEFT AND RIGHT HEART CATHETERIZATION WITH CORONARY ANGIOGRAM;  Surgeon: Burnell Blanks, MD;  Location: [facility] CATH LAB;  Service: Cardiovascular;  Laterality: N/A;   LEFT AND RIGHT HEART CATHETERIZATION WITH CORONARY/GRAFT ANGIOGRAM N/A 09/14/2014   Procedure: LEFT AND RIGHT HEART CATHETERIZATION WITH Beatrix Fetters;  Surgeon: Burnell Blanks, MD;  Location: [facility] CATH LAB;  Service: Cardiovascular;  Laterality: N/A;   REVERSE SHOULDER ARTHROPLASTY Left 03/06/2022   Procedure: LEFT SHOULDER REPLACEMENT APPLICATION OF WOUND VAC;  Surgeon: [name], MD;  Location: [place];  Service: Orthopedics;  Laterality: Left;   TEE WITHOUT CARDIOVERSION N/A 03/14/2013   Procedure: TRANSESOPHAGEAL ECHOCARDIOGRAM (TEE);  Surgeon: Lelon Perla, MD;  Location: [facility] ENDOSCOPY;  Service: Cardiovascular;  Laterality: N/A;   TEE WITHOUT CARDIOVERSION N/A 11/14/2014   Procedure: TRANSESOPHAGEAL ECHOCARDIOGRAM (TEE);  Surgeon: Lelon Perla, MD;  Location: [facility] ENDOSCOPY;  Service: Cardiovascular;  Laterality: N/A;   TONSILLECTOMY     maybe as a child- does not know   TOTAL KNEE ARTHROPLASTY  09/10/2012   Procedure: TOTAL KNEE ARTHROPLASTY;  Surgeon: Mcarthur Rossetti, MD;  [facility];  Service: Orthopedics;  Laterality: Left;   TOTAL KNEE REVISION Right 07/15/2013   Procedure: REVISION ARTHROPLASTY RIGHT KNEE;  Surgeon: Mcarthur Rossetti, MD;  [facility];  Service: Orthopedics;  Laterality: Right;   TRANSCATHETER AORTIC VALVE REPLACEMENT, TRANSFEMORAL N/A 12/12/2014   Procedure: TRANSCATHETER AORTIC VALVE REPLACEMENT, TRANSFEMORAL;  Surgeon: Burnell Blanks, MD;  Location: [place];   Service: Cardiovascular;  Laterality: N/A;   TRIGGER FINGER RELEASE  09/10/2012   Procedure: RELEASE TRIGGER FINGER/A-1 PULLEY;  Surgeon: Mcarthur Rossetti, MD;  [facility];  Service: Orthopedics;  Laterality: Right;  Right Ring Finger   TUBAL LIGATION     Patient Active Problem List   Diagnosis Date Noted   Arthritis of left shoulder region    S/P reverse total shoulder arthroplasty, 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   Healthcare maintenance 11/17/2021   Stress reaction 11/17/2021   Spondylolisthesis, lumbar region 02/11/2021    Class: Acute   Malignant neoplasm of upper-outer quadrant of right breast in female, estrogen receptor positive ([place]) 06/10/2018   Restless leg syndrome 02/04/2018   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   Depression 08/06/2015   Hypertension associated with diabetes ([place])  Aortic stenosis    Chronic diastolic congestive heart failure (HCC)    COPD (chronic obstructive pulmonary disease) ([place]) 09/21/2013   Obstructive sleep apnea 09/21/2013   Nocturnal hypoxemia 09/12/2013   Hyperlipidemia 11/30/2012   History of pulmonary embolus (PE) 07/09/2012   Normocytic anemia 02/08/2012   Type 2 diabetes mellitus without complication, without long-term current use of insulin ([place]) 02/08/2012   Asthma 02/08/2012   GERD (gastroesophageal reflux disease)    CAD (coronary artery disease) 12/08/2011    REFERRING DIAG:  V78.469 (ICD-10-CM) - History of arthroplasty of left shoulder  THERAPY DIAG:  Chronic left shoulder pain  Muscle weakness (generalized)  PERTINENT HISTORY:  HTN, DMII, MI, Anxiety/Depression, COPD, breast cancer  PRECAUTIONS:  Shoulder - Reverse TSA   SUBJECTIVE:  Patient reports she is doing well, states pain has improved. She is consistent with her exercises at home.  Pain: Are you having pain?  Yes: NPRS scale: 2.5-3/10 (10/10 at worst) Pain location: L shoulder Pain description: sharp  Aggravating factors: accidentally  rolling on L side Relieving factors: ice, medication   OBJECTIVE:  PATIENT SURVEYS:  Quick Dash 68% disability   UE ROM:   AROM Right 03/31/2022 Left 03/31/2022  Shoulder flexion WFL 95 PROM  Shoulder extension      Shoulder abduction WFL 94 PROM  Shoulder internal rotation WFL 40 PROM  Shoulder external rotation WFL 10 PROM  Elbow flexion      Elbow extension      Wrist flexion      Wrist extension      (Blank rows = not tested)      TODAY'S TREATMENT:  [facility] Adult PT Treatment: DATE: 04/28/2022 Therapeutic Exercise: Pulley shoulder flexion x 2 min Supine shoulder flexion AAROM with dowel 2 x 10 Supine shoulder flexion AROM 2 x 10 Seated shoulder ER AROM 2 x 10 Seated scapular retraction 2 x 10 Standing shoulder flexion, abd, ext isometric with arm by side 2 x 5 with 5 sec hold Low row with red 2 x 10 Wall slide shoulder flexion 2 x 10 Standing physioball roll out shoulder flexion stretch x 10, shoulder abduction x 10 Standing shoulder elevation AAROM with dowel x 10 Shoulder elevation AAROM with UE Ranger x 10   [facility] Adult PT Treatment: DATE: 04/24/2022 Therapeutic Exercise: Seated scapular retraction 2x10 Seated ER cane AAROM 2x10 L  Low row 3x10 RTB  Supine shoulder flex AAROM with dow 2x10 Supine flexion AROM x 10 L Pendulums x 20 cw/ccw Sitting L shoulder ER isometric 2x10 - 50% force Pulleys shoulder flexion x 2 min L shoulder scaption table slide 2x10  [facility] Adult PT Treatment: DATE: 04/14/2022 Therapeutic Exercise: Seated scapular retraction 2x10 Seated ER cane AAROM 2x10 L  Bicep curl 2x15 1# Supine shoulder flex AAROM with dow 2x10 Pendulums x 20 cw/ccw Sitting L shoulder ER isometric 2x10 - 50% force Manual Therapy PROM into L shoulder flex/abd Modalities:  GameReady Left Shoulder  10 min 38deg  PATIENT EDUCATION: Education details: HEP Person educated: Patient Education method: Consulting civil engineer, Media planner, and Handouts Education  comprehension: verbalized understanding and returned demonstration   HOME EXERCISE PROGRAM: Access Code: [email or web address]    ASSESSMENT: CLINICAL IMPRESSION: Patient tolerated therapy well with no adverse effects. Therapy focused on progression of shoulder AAROM to AROM primarily for shoulder elevation. She was able to complete all prescribed exercises with good tolerance, she did report occasional discomfort at end ranges and fatigue post session. Patient does require cueing to avoid excessive shrug with elevation. No  changes to HEP this visit. She would benefit from continued skilled PT to progress her mobility and strength in order to reduce pain and maximize functional ability.     OBJECTIVE IMPAIRMENTS Abnormal gait, decreased activity tolerance, decreased balance, decreased endurance, decreased mobility, difficulty walking, decreased ROM, decreased strength, impaired UE functional use, and pain.    ACTIVITY LIMITATIONS cleaning, community activity, driving, and laundry.    PERSONAL FACTORS Fitness and 3+ comorbidities: HTN, DMII, MI, Anxiety/Depression, COPD, breast cancer  are also affecting patient's functional outcome.      GOALS: Goals reviewed with patient? No   SHORT TERM GOALS: Target date: 04/21/2022   Pt will be compliant and knowledgeable with initial HEP for improved comfort and carryover Baseline: initial HEP given  04/28/2022: progressing Goal status: ONGOING   2.  Pt will self report L shoulder pain no greater than 7/10 for improved comfort and functional ability Baseline: 10/10 at worst 04/28/2022: 3/10 with therapy Goal status: ONGOING   LONG TERM GOALS: Target date: 06/23/2022   Pt will self report L shoulder pain no greater than 3/10 for improved comfort and functional ability Baseline: 10/10 at worst Goal status: INITIAL   2.  Pt will reduce quick DASH to no greater than 48% disability as proxy for functional improvement Baseline: 68% disability Goal status:  INITIAL   3.  Pt will be able to perform L shoulder flexion/abduction to no less than 140 degrees for improved functional ability with home ADLs Baseline: see chart Goal status: INITIAL   4.  Pt will be able to reach into cabinets not limited by pain for improved functional ability and ADL performance Baseline: unable Goal status: INITIAL   PLAN: PT FREQUENCY: 2x/week   PT DURATION: 10 weeks   PLANNED INTERVENTIONS: Therapeutic exercises, Therapeutic activity, Neuromuscular re-education, Balance training, Gait training, Patient/Family education, Joint mobilization, Electrical stimulation, Cryotherapy, Moist heat, Vasopneumatic device, and Manual therapy   PLAN FOR NEXT SESSION: assess HEP response, progress per protocol    Hilda Blades, PT, DPT, LAT, ATC 04/28/22  4:55 PM Phone: [phone] Fax: [phone]    PHYSICAL THERAPY DISCHARGE SUMMARY  Visits from Start of Care: 5  Current functional level related to goals / functional outcomes: SEE ABOVE   Remaining deficits: See above   Education / Equipment: HEP   Patient agrees to discharge. Patient goals were partially met. Patient is being discharged due to not returning since the last visit.

## 2022-04-30 ENCOUNTER — Other Ambulatory Visit: Payer: Self-pay | Admitting: Student

## 2022-04-30 DIAGNOSIS — J441 Chronic obstructive pulmonary disease with (acute) exacerbation: Secondary | ICD-10-CM

## 2022-05-01 ENCOUNTER — Ambulatory Visit: Payer: Medicaid Other

## 2022-05-07 ENCOUNTER — Ambulatory Visit: Payer: Medicaid Other

## 2022-05-08 NOTE — Therapy (Incomplete)
?OUTPATIENT PHYSICAL THERAPY TREATMENT NOTE ? ? ?Patient Name: [name] ?MRN: [id number] ?DOB:07-03-1961, 61 y.o., female ?Today's Date: 05/08/2022 ? ?PCP: [name], MD ?REFERRING PROVIDER: [name], MD ? ?END OF SESSION:  ? ? ? ? ? ? ? ?Past Medical History:  ?Diagnosis Date  ? Anemia   ? Anxiety   ? Aortic valve stenosis, severe   ? Arthritis   ? PAIN AND SEVERE OA LEFT KNEE ; S/P RIGHT TKA ON 02/03/12; HAS LOWER BACK PAIN-UNABLE TO STAND MORE THAN 10 MIN; ARTHRITIS "ALL OVER"  ? Asthma   ? Blood transfusion   ? [facility]  ? Breast cancer in female [facility])   ? Right  ? CAD (coronary artery disease)   ? Cath 2010 with DES x 1 RCA-- PT'S CARDIOLOGIST IS DR. [place]  ? Chronic diastolic congestive heart failure ([place])   ? COPD (chronic obstructive pulmonary disease) ([place])   ? Depression   ? Diabetes mellitus DIAGNOSED IN2010  ? Dyspnea   ? with much ambulation  ? Eczema   ? on back  ? Headache   ? migraines younger- rare now 02/07/21  ? Heart murmur   ? no current problems  ? History of hiatal hernia   ? History of kidney stones   ? passed or blasted  ? Hyperlipidemia   ? Hypertension   ? Morbid obesity with body mass index of 60.0-69.9 in adult [facility])   ? Myocardial infarction [facility])   ? PT THINKS SHE WAS DX WITH MI AT THE TIME OF HEART STENTING  ? Neuromuscular disorder ([place])   ? bilateral arm/hands  ? Oxygen dependent   ? uses 3L oxygen night/prn  ? Personal history of radiation therapy   ? Pneumonia   ? Pulmonary embolism ([place]) 02/08/2012  ? S/P RT TOTAL KNEE ON 02/03/12--ON 02/08/12--DEVELOPED ACUTE SOB AND CHEST PAIN--AND DIAGNOSED WITH  PULMONARY EMBOLUS AND PNEUMONIA  ? Restless leg syndrome   ? Sleep apnea   ? uses 3 liters O2 at night as needed  ? Uterine fibroid   ? NO PROBLEMS AT PRESENT FROM THE FIBROIDS-STATES SHE IS POST MENOPAUSAL-LAST MENSES 2010 EXCEPT FOR EPISODE THIS YR OF BLEEDING RELATED TO FIBROIDS.  ? Weakness   ? BOTH HANDS - S/P BILATERAL CARPAL TUNNEL RELEASE--BUT STILL  HAS WEAKNESS--OFTEN DROPS THINGS  ? ?Past Surgical History:  ?Procedure Laterality Date  ? BACK SURGERY  02/11/2021  ? Dr Louanne Skye  ? BREAST BIOPSY Right 06/04/2018  ? BREAST LUMPECTOMY Right 06/2018  ? BREAST LUMPECTOMY WITH RADIOACTIVE SEED AND SENTINEL LYMPH NODE BIOPSY Right 07/19/2018  ? Procedure: BREAST LUMPECTOMY WITH RADIOACTIVE SEED AND SENTINEL LYMPH NODE BIOPSY;  Surgeon: Alphonsa Overall, MD;  Location: [place];  Service: General;  Laterality: Right;  ? CARDIAC CATHETERIZATION    ? CARDIAC VALVE REPLACEMENT    ? 2017  ? CARPAL TUNNEL RELEASE    ? Bilateral  ? CHOLECYSTECTOMY    ? COLONOSCOPY    ? CORONARY ANGIOPLASTY    ? 2010 has stent in place  ? CYSTOSCOPY W/ RETROGRADES Right 09/21/2013  ? Procedure: CYSTOSCOPY WITH RIGHT RETROGRADE PYELOGRAM RIGHT DOUBLE J STENT ;  Surgeon: Fredricka Bonine, MD;  [facility];  Service: Urology;  Laterality: Right;  ? CYSTOSCOPY WITH URETEROSCOPY AND STENT PLACEMENT Right 10/25/2013  ? Procedure: CYSTOSCOPY RIGHT URETEROSCOPY HOLMIUM LASER LITHO AND STENT PLACEMENT;  Surgeon: Fredricka Bonine, MD;  [facility];  Service: Urology;  Laterality: Right;  ? HERNIA REPAIR    ?  INTRAOPERATIVE TRANSESOPHAGEAL ECHOCARDIOGRAM N/A 12/12/2014  ? Procedure: INTRAOPERATIVE TRANSESOPHAGEAL ECHOCARDIOGRAM;  Surgeon: Burnell Blanks, MD;  Location: [facility];  Service: Cardiovascular;  Laterality: N/A;  ? JOINT REPLACEMENT    ? bil total knees  ? KNEE ARTHROPLASTY  02/03/2012  ? Procedure: COMPUTER ASSISTED TOTAL KNEE ARTHROPLASTY;  Surgeon: Mcarthur Rossetti, MD;  Location: [place];  Service: Orthopedics;  Laterality: Right;  Right total knee arthroplasty  ? LEFT AND RIGHT HEART CATHETERIZATION WITH CORONARY ANGIOGRAM N/A 03/17/2013  ? Procedure: LEFT AND RIGHT HEART CATHETERIZATION WITH CORONARY ANGIOGRAM;  Surgeon: Burnell Blanks, MD;  Location: [facility] CATH LAB;  Service: Cardiovascular;  Laterality: N/A;  ? LEFT AND RIGHT HEART CATHETERIZATION WITH  CORONARY/GRAFT ANGIOGRAM N/A 09/14/2014  ? Procedure: LEFT AND RIGHT HEART CATHETERIZATION WITH Beatrix Fetters;  Surgeon: Burnell Blanks, MD;  Location: [facility] CATH LAB;  Service: Cardiovascular;  Laterality: N/A;  ? REVERSE SHOULDER ARTHROPLASTY Left 03/06/2022  ? Procedure: LEFT SHOULDER REPLACEMENT APPLICATION OF WOUND VAC;  Surgeon: [name], MD;  Location: [place];  Service: Orthopedics;  Laterality: Left;  ? TEE WITHOUT CARDIOVERSION N/A 03/14/2013  ? Procedure: TRANSESOPHAGEAL ECHOCARDIOGRAM (TEE);  Surgeon: Lelon Perla, MD;  Location: [facility] ENDOSCOPY;  Service: Cardiovascular;  Laterality: N/A;  ? TEE WITHOUT CARDIOVERSION N/A 11/14/2014  ? Procedure: TRANSESOPHAGEAL ECHOCARDIOGRAM (TEE);  Surgeon: Lelon Perla, MD;  Location: [facility] ENDOSCOPY;  Service: Cardiovascular;  Laterality: N/A;  ? TONSILLECTOMY    ? maybe as a child- does not know  ? TOTAL KNEE ARTHROPLASTY  09/10/2012  ? Procedure: TOTAL KNEE ARTHROPLASTY;  Surgeon: Mcarthur Rossetti, MD;  [facility];  Service: Orthopedics;  Laterality: Left;  ? TOTAL KNEE REVISION Right 07/15/2013  ? Procedure: REVISION ARTHROPLASTY RIGHT KNEE;  Surgeon: Mcarthur Rossetti, MD;  [facility];  Service: Orthopedics;  Laterality: Right;  ? TRANSCATHETER AORTIC VALVE REPLACEMENT, TRANSFEMORAL N/A 12/12/2014  ? Procedure: TRANSCATHETER AORTIC VALVE REPLACEMENT, TRANSFEMORAL;  Surgeon: Burnell Blanks, MD;  Location: [place];  Service: Cardiovascular;  Laterality: N/A;  ? TRIGGER FINGER RELEASE  09/10/2012  ? Procedure: RELEASE TRIGGER FINGER/A-1 PULLEY;  Surgeon: Mcarthur Rossetti, MD;  [facility];  Service: Orthopedics;  Laterality: Right;  Right Ring Finger  ? TUBAL LIGATION    ? ?Patient Active Problem List  ? Diagnosis Date Noted  ? Arthritis of left shoulder region   ? 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 Healthcare maintenance 11/17/2021  ? Stress reaction 11/17/2021  ?  Spondylolisthesis, lumbar region 02/11/2021  ?  Class: Acute  ? Malignant neoplasm of upper-outer quadrant of right breast in female, estrogen receptor positive ([place]) 06/10/2018  ? Restless leg syndrome 02/04/2018  ? 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  ? Depression 08/06/2015  ? Hypertension associated with diabetes ([place])   ? Aortic stenosis   ? Chronic diastolic congestive heart failure ([place])   ? COPD (chronic obstructive pulmonary disease) ([place]) 09/21/2013  ? Obstructive sleep apnea 09/21/2013  ? Nocturnal hypoxemia 09/12/2013  ? Hyperlipidemia 11/30/2012  ? History of pulmonary embolus (PE) 07/09/2012  ? Normocytic anemia 02/08/2012  ? Type 2 diabetes mellitus without complication, without long-term current use of insulin ([place]) 02/08/2012  ? Asthma 02/08/2012  ? GERD (gastroesophageal reflux disease)   ? CAD (coronary artery disease) 12/08/2011  ? ? ?REFERRING DIAG:  ?J33.545 (ICD-10-CM) - History of arthroplasty of left shoulder ? ?THERAPY DIAG:  ?No diagnosis found. ? ?PERTINENT HISTORY:  ?HTN, DMII, MI, Anxiety/Depression, COPD, breast  cancer ? ?PRECAUTIONS:  ?Shoulder - Reverse TSA ? ? ?SUBJECTIVE:  ?Patient reports she is doing well, states pain has improved. She is consistent with her exercises at home. ? ?Pain: ?Are you having pain?  ?Yes: NPRS scale: 2.5-3/10 (10/10 at worst) ?Pain location: L shoulder ?Pain description: sharp  ?Aggravating factors: accidentally rolling on L side ?Relieving factors: ice, medication ? ? ?OBJECTIVE:  ?PATIENT SURVEYS:  ?Quick Dash 68% disability ?  ?UE ROM: ?  ?AROM Right ?03/31/2022 Left ?03/31/2022  ?Shoulder flexion WFL 95 PROM  ?Shoulder extension      ?Shoulder abduction WFL 94 PROM  ?Shoulder internal rotation WFL 40 PROM  ?Shoulder external rotation WFL 10 PROM  ?Elbow flexion      ?Elbow extension      ?Wrist flexion      ?Wrist extension      ?(Blank rows = not tested) ?    ? ?TODAY'S TREATMENT:  ?[facility] Adult PT Treatment: DATE: 05/09/2022 ?Therapeutic  Exercise: ?Pulley shoulder flexion x 2 min ?Supine shoulder flexion AAROM with dowel 2 x 10 ?Supine shoulder flexion AROM 2 x 10 ?Seated shoulder ER AROM 2 x 10 ?Seated scapular retraction 2 x 10 ?Standing shoulder fl

## 2022-05-09 ENCOUNTER — Ambulatory Visit: Payer: Medicaid Other | Admitting: Physical Therapy

## 2022-05-10 ENCOUNTER — Ambulatory Visit
Admission: EM | Admit: 2022-05-10 | Discharge: 2022-05-10 | Disposition: A | Discharge status: Home / Self Care | Payer: Medicaid Other | Attending: Internal Medicine | Admitting: Internal Medicine

## 2022-05-10 ENCOUNTER — Encounter: Payer: Self-pay | Admitting: Emergency Medicine

## 2022-05-10 DIAGNOSIS — J441 Chronic obstructive pulmonary disease with (acute) exacerbation: Secondary | ICD-10-CM

## 2022-05-10 DIAGNOSIS — J329 Chronic sinusitis, unspecified: Secondary | ICD-10-CM

## 2022-05-10 DIAGNOSIS — R0982 Postnasal drip: Secondary | ICD-10-CM

## 2022-05-10 DIAGNOSIS — H66001 Acute suppurative otitis media without spontaneous rupture of ear drum, right ear: Secondary | ICD-10-CM

## 2022-05-10 DIAGNOSIS — J310 Chronic rhinitis: Secondary | ICD-10-CM

## 2022-05-10 DIAGNOSIS — J9801 Acute bronchospasm: Secondary | ICD-10-CM | POA: Diagnosis not present

## 2022-05-10 DIAGNOSIS — J302 Other seasonal allergic rhinitis: Secondary | ICD-10-CM | POA: Diagnosis not present

## 2022-05-10 DIAGNOSIS — J3089 Other allergic rhinitis: Secondary | ICD-10-CM | POA: Diagnosis not present

## 2022-05-10 LAB — POCT RAPID STREP A (OFFICE): Rapid Strep A Screen: NEGATIVE

## 2022-05-10 MED ORDER — FLUTICASONE PROPIONATE 50 MCG/ACT NA SUSP: 1.0000 | Freq: Every day | NASAL | 1 refills | Status: DC

## 2022-05-10 MED ORDER — CEFDINIR 300 MG PO CAPS: 300.0000 mg | ORAL_CAPSULE | Freq: Two times a day (BID) | ORAL | 0 refills | Status: AC

## 2022-05-10 MED ORDER — FEXOFENADINE HCL 180 MG PO TABS: 180.0000 mg | ORAL_TABLET | Freq: Every day | ORAL | 1 refills | Status: DC

## 2022-05-10 MED ORDER — MONTELUKAST SODIUM 10 MG PO TABS: 10.0000 mg | ORAL_TABLET | Freq: Every day | ORAL | 2 refills | Status: DC

## 2022-05-10 NOTE — ED Triage Notes (Signed)
Sore throat, nasal congestion, ear pain, worsening chronic cough (hx COPD) starting yesterday. Denies feeling more SOB than normal. ?

## 2022-05-10 NOTE — Discharge Instructions (Addendum)
Please see the list below for recommended medications, dosages and frequencies to provide relief of your current symptoms:   ? ?Omnicef (cefdinir):  For the infection in your right ear, please begin cefdinir 1 capsule twice daily for 10 days, you can take it with or without food.  This antibiotic can cause upset stomach, this will resolve once antibiotics are complete.  You are welcome to use a probiotic, eat yogurt, take Imodium while taking this medication.  Please avoid other systemic medications such as Maalox, Pepto-Bismol or milk of magnesia as they can interfere with your body's ability to absorb the antibiotics. ?  ?It is very important that you take antibiotics as prescribed.  If you skip doses or do not complete the full course of antibiotics, you put yourself at significant risk of recurrent infection which can often be worse than your initial infection. ?  ?Your symptoms and my physical exam findings are also concerning for exacerbation of your underlying allergies which has worsened your work of breathing.  It is important that you begin your allergy regimen now and are consistent with taking allergy medications exactly as prescribed.  Allergy medications are preventative and therefore only work well when they are taken daily, not "as needed". ?  ?Allegra (fexofenadine): This is an excellent second-generation antihistamine that helps to reduce respiratory inflammatory response to environmental allergens.  This medication is not known to cause daytime sleepiness so it can be taken in the daytime.  If you find that it does make you sleepy, please feel free to take it at bedtime. ?  ?Singulair (montelukast): This is a mast cell stabilizer that works well with antihistamines.  Mast cells are responsible for stimulating histamine production so you can imagine that if we can reduce the activity of your mast cells, then fewer histamines will be produced and inflammation caused by allergy exposure will be  significantly reduced.  I recommend that you take this medication at the same time you take your antihistamine. ?  ?Flonase (fluticasone): This is a steroid nasal spray that you use once daily, 1 spray in each nare.  This medication does not work well if you decide to use it only used as you feel you need to, it works best used on a daily basis.  After 3 to 5 days of use, you will notice significant reduction of the inflammation and mucus production that is currently being caused by exposure to allergens, whether seasonal or environmental.  The most common side effect of this medication is nosebleeds.  If you experience a nosebleed, please discontinue use for 1 week, then feel free to resume.  I have provided you with a prescription.   ?  ?Trelegy (fluticasone, vilanterol and umeclidinium):  This inhaled medication contains a corticosteroid and long-acting form of albuterol.  The inhaled steroid and this medication  is not absorbed into the body and will not cause side effects such as increased blood sugar levels, irritability, sleeplessness or weight gain.  Inhaled corticosteroid are sort of like topical steroid creams but, as you can imagine, it is not practical to attempt to rub a steroid cream inside of your lungs.  The long-acting albuterol works similarly to the short acting albuterol found in your rescue inhaler but provides 24-hour relaxation of the smooth muscles that open and constrict your airways; your short acting rescue inhaler can only provide for a few hours this benefit for a few hours.  The third unique ingredient, umeclidinium, is an antimuscarinic and works similarly  to your albuterol, providing long-acting relaxation of the smooth muscles in your airway.  Please feel free to continue using your short acting rescue inhaler as often as needed throughout the day for shortness of breath, wheezing, and cough.  Please increase your dose to 1 puff twice daily. ?  ?Please follow-up within the next 5-7  days either with your primary care provider or urgent care if your symptoms do not resolve.  Please also discuss with your primary care provider whether or not you would benefit from increased dose of Trelegy during seasonal allergy months and whether they would like for you to continue taking Singulair. ?  ? Thank you for visiting urgent care today.  We appreciate the opportunity to participate in your care.  ? ? ?

## 2022-05-12 ENCOUNTER — Other Ambulatory Visit: Payer: Self-pay | Admitting: Specialist

## 2022-05-12 DIAGNOSIS — E119 Type 2 diabetes mellitus without complications: Secondary | ICD-10-CM

## 2022-05-12 NOTE — ED Provider Notes (Signed)
?[place] ? ? ? ?CSN: [id number] ?Arrival date & time: 05/10/22  1332 ?  ? ?HISTORY  ? ?Chief Complaint  ?Patient presents with  ? Sore Throat  ? ?HPI ?[name] is a 61 y.o. female. Patient presents urgent care today complaining of sore throat, nasal congestion, ear pain and worsening of her chronic cough due to history of COPD.  Patient states her symptoms began yesterday.  Patient states she has not been feeling more short of breath than normal however on arrival today, her oxygen saturation was measured at 90%.  Patient also has a mildly elevated temperature at 99 degrees.  Patient denies known fever, body aches, chills, headache, difficulty with swallowing, nausea, vomiting, diarrhea, paroxysmal nocturnal dyspnea, shortness of breath at rest or with exertion.  Per my observation, when patient was walking to the exam room she did appear winded. ? ?The history is provided by the patient.  ?Past Medical History:  ?Diagnosis Date  ? Anemia   ? Anxiety   ? Aortic valve stenosis, severe   ? Arthritis   ? PAIN AND SEVERE OA LEFT KNEE ; S/P RIGHT TKA ON 02/03/12; HAS LOWER BACK PAIN-UNABLE TO STAND MORE THAN 10 MIN; ARTHRITIS "ALL OVER"  ? Asthma   ? Blood transfusion   ? 2013[facility]  ? Breast cancer in female [facility])   ? Right  ? CAD (coronary artery disease)   ? Cath 2010 with DES x 1 RCA-- PT'S CARDIOLOGIST IS DR. [place]  ? Chronic diastolic congestive heart failure ([place])   ? COPD (chronic obstructive pulmonary disease) ([place])   ? Depression   ? Diabetes mellitus DIAGNOSED IN2010  ? Dyspnea   ? with much ambulation  ? Eczema   ? on back  ? Headache   ? migraines younger- rare now 02/07/21  ? Heart murmur   ? no current problems  ? History of hiatal hernia   ? History of kidney stones   ? passed or blasted  ? Hyperlipidemia   ? Hypertension   ? Morbid obesity with body mass index of 60.0-69.9 in adult [facility])   ? Myocardial infarction [facility])   ? PT THINKS SHE WAS DX WITH MI AT THE TIME OF HEART  STENTING  ? Neuromuscular disorder ([place])   ? bilateral arm/hands  ? Oxygen dependent   ? uses 3L oxygen night/prn  ? Personal history of radiation therapy   ? Pneumonia   ? Pulmonary embolism ([place]) 02/08/2012  ? S/P RT TOTAL KNEE ON 02/03/12--ON 02/08/12--DEVELOPED ACUTE SOB AND CHEST PAIN--AND DIAGNOSED WITH  PULMONARY EMBOLUS AND PNEUMONIA  ? Restless leg syndrome   ? Sleep apnea   ? uses 3 liters O2 at night as needed  ? Uterine fibroid   ? NO PROBLEMS AT PRESENT FROM THE FIBROIDS-STATES SHE IS POST MENOPAUSAL-LAST MENSES 2010 EXCEPT FOR EPISODE THIS YR OF BLEEDING RELATED TO FIBROIDS.  ? Weakness   ? BOTH HANDS - S/P BILATERAL CARPAL TUNNEL RELEASE--BUT STILL HAS WEAKNESS--OFTEN DROPS THINGS  ? ?Patient Active Problem List  ? Diagnosis Date Noted  ? Arthritis of left shoulder region   ? 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 Healthcare maintenance 11/17/2021  ? Stress reaction 11/17/2021  ? Spondylolisthesis, lumbar region 02/11/2021  ?  Class: Acute  ? Malignant neoplasm of upper-outer quadrant of right breast in female, estrogen receptor positive ([place]) 06/10/2018  ? Restless leg syndrome 02/04/2018  ? 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  ? Depression 08/06/2015  ?  Hypertension associated with diabetes ([place])   ? Aortic stenosis   ? Chronic diastolic congestive heart failure ([place])   ? COPD (chronic obstructive pulmonary disease) ([place]) 09/21/2013  ? Obstructive sleep apnea 09/21/2013  ? Nocturnal hypoxemia 09/12/2013  ? Hyperlipidemia 11/30/2012  ? History of pulmonary embolus ([place]) 07/09/2012  ? Normocytic anemia 02/08/2012  ? Type 2 diabetes mellitus without complication, without long-term current use of insulin ([place]) 02/08/2012  ? Asthma 02/08/2012  ? GERD (gastroesophageal reflux disease)   ? CAD (coronary artery disease) 12/08/2011  ? ?Past Surgical History:  ?Procedure Laterality Date  ? BACK SURGERY  02/11/2021  ? Dr Louanne Skye  ? BREAST BIOPSY Right 06/04/2018  ? BREAST  LUMPECTOMY Right 06/2018  ? BREAST LUMPECTOMY WITH RADIOACTIVE SEED AND SENTINEL LYMPH NODE BIOPSY Right 07/19/2018  ? Procedure: BREAST LUMPECTOMY WITH RADIOACTIVE SEED AND SENTINEL LYMPH NODE BIOPSY;  Surgeon: Alphonsa Overall, MD;  Location: [place];  Service: General;  Laterality: Right;  ? CARDIAC CATHETERIZATION    ? CARDIAC VALVE REPLACEMENT    ? 2017  ? CARPAL TUNNEL RELEASE    ? Bilateral  ? CHOLECYSTECTOMY    ? COLONOSCOPY    ? CORONARY ANGIOPLASTY    ? 2010 has stent in place  ? CYSTOSCOPY W/ RETROGRADES Right 09/21/2013  ? Procedure: CYSTOSCOPY WITH RIGHT RETROGRADE PYELOGRAM RIGHT DOUBLE J STENT ;  Surgeon: Fredricka Bonine, MD;  Location[facility];  Service: Urology;  Laterality: Right;  ? CYSTOSCOPY WITH URETEROSCOPY AND STENT PLACEMENT Right 10/25/2013  ? Procedure: CYSTOSCOPY RIGHT URETEROSCOPY HOLMIUM LASER LITHO AND STENT PLACEMENT;  Surgeon: Fredricka Bonine, MD;  Location[facility];  Service: Urology;  Laterality: Right;  ? HERNIA REPAIR    ? INTRAOPERATIVE TRANSESOPHAGEAL ECHOCARDIOGRAM N/A 12/12/2014  ? Procedure: INTRAOPERATIVE TRANSESOPHAGEAL ECHOCARDIOGRAM;  Surgeon: Burnell Blanks, MD;  Location: [facility] OR;  Service: Cardiovascular;  Laterality: N/A;  ? JOINT REPLACEMENT    ? bil total knees  ? KNEE ARTHROPLASTY  02/03/2012  ? Procedure: COMPUTER ASSISTED TOTAL KNEE ARTHROPLASTY;  Surgeon: Mcarthur Rossetti, MD;  Location: [place];  Service: Orthopedics;  Laterality: Right;  Right total knee arthroplasty  ? LEFT AND RIGHT HEART CATHETERIZATION WITH CORONARY ANGIOGRAM N/A 03/17/2013  ? Procedure: LEFT AND RIGHT HEART CATHETERIZATION WITH CORONARY ANGIOGRAM;  Surgeon: Burnell Blanks, MD;  Location: [facility] CATH LAB;  Service: Cardiovascular;  Laterality: N/A;  ? LEFT AND RIGHT HEART CATHETERIZATION WITH CORONARY/GRAFT ANGIOGRAM N/A 09/14/2014  ? Procedure: LEFT AND RIGHT HEART CATHETERIZATION WITH Beatrix Fetters;  Surgeon: Burnell Blanks, MD;  Location: [facility]  CATH LAB;  Service: Cardiovascular;  Laterality: N/A;  ? REVERSE SHOULDER ARTHROPLASTY Left 03/06/2022  ? Procedure: LEFT SHOULDER REPLACEMENT APPLICATION OF WOUND VAC;  Surgeon: [name], MD;  Location: [place];  Service: Orthopedics;  Laterality: Left;  ? TEE WITHOUT CARDIOVERSION N/A 03/14/2013  ? Procedure: TRANSESOPHAGEAL ECHOCARDIOGRAM (TEE);  Surgeon: Lelon Perla, MD;  Location: [facility] ENDOSCOPY;  Service: Cardiovascular;  Laterality: N/A;  ? TEE WITHOUT CARDIOVERSION N/A 11/14/2014  ? Procedure: TRANSESOPHAGEAL ECHOCARDIOGRAM (TEE);  Surgeon: Lelon Perla, MD;  Location: [facility] ENDOSCOPY;  Service: Cardiovascular;  Laterality: N/A;  ? TONSILLECTOMY    ? maybe as a child- does not know  ? TOTAL KNEE ARTHROPLASTY  09/10/2012  ? Procedure: TOTAL KNEE ARTHROPLASTY;  Surgeon: Mcarthur Rossetti, MD;  [facility];  Service: Orthopedics;  Laterality: Left;  ? TOTAL KNEE REVISION Right 07/15/2013  ? Procedure: REVISION ARTHROPLASTY RIGHT KNEE;  Surgeon:  Mcarthur Rossetti, MD;  [facility];  Service: Orthopedics;  Laterality: Right;  ? TRANSCATHETER AORTIC VALVE REPLACEMENT, TRANSFEMORAL N/A 12/12/2014  ? Procedure: TRANSCATHETER AORTIC VALVE REPLACEMENT, TRANSFEMORAL;  Surgeon: Burnell Blanks, MD;  Location: [place];  Service: Cardiovascular;  Laterality: N/A;  ? TRIGGER FINGER RELEASE  09/10/2012  ? Procedure: RELEASE TRIGGER FINGER/A-1 PULLEY;  Surgeon: Mcarthur Rossetti, MD;  [facility];  Service: Orthopedics;  Laterality: Right;  Right Ring Finger  ? TUBAL LIGATION    ? ?OB History   ? ? Gravida  ?3  ? Para  ?   ? Term  ?   ? Preterm  ?   ? AB  ?1  ? Living  ?   ?  ? ? SAB  ?1  ? IAB  ?   ? Ectopic  ?   ? Multiple  ?   ? Live Births  ?2  ?   ?  ? Obstetric Comments  ?SVD x 2. SAB x 1.   ?  ? ?  ? ?Home Medications   ? ?Prior to Admission medications   ?Medication Sig Start Date End Date Taking? Authorizing Provider  ?cefdinir (OMNICEF) 300 MG capsule Take 1 capsule  (300 mg total) by mouth 2 (two) times daily for 10 days. 05/10/22 05/20/22 Yes [name], PA-C  ?fexofenadine (ALLEGRA) 180 MG tablet Take 1 tablet (180 mg total) by mouth daily. 05/10/22 11/06/22 Yes Lilia Pro, L

## 2022-05-19 ENCOUNTER — Other Ambulatory Visit: Payer: Self-pay | Admitting: Obstetrics and Gynecology

## 2022-05-19 NOTE — Patient Instructions (Signed)
Hi [name] for speaking with me-have a great afternoon!!  [name] was given information about Medicaid Managed Care team care coordination services as a part of their [facility] Medicaid benefit. [name] verbally consented to engagement with the [facility] Managed Care team.   If you are experiencing a medical emergency, please call 911 or report to your local emergency department or urgent care.   If you have a non-emergency medical problem during routine business hours, please contact your provider's office and ask to speak with a nurse.   For questions related to your [facility] health plan, please call: [phone] or go here:[email or web address]  If you would like to schedule transportation through your [facility] plan, please call the following number at least 2 days in advance of your appointment: [phone].  You can also use the MTM portal or MTM mobile app to manage your rides. For the portal, please go to [email or web address].  Call the [place] at [phone], at any time, 24 hours a day, 7 days a week. If you are in danger or need immediate medical attention call 911.  If you would like help to quit smoking, call 1-800-QUIT-NOW 579 635 7308) OR Espaol: 1-855-Djelo-Ya (6-301-601-0932) o para ms informacin haga clic aqu or Text READY to 200-400 to register via text  [name] - following are the goals we discussed in your visit today:   Goals Addressed    Long-Range Goal: Establish Plan of Care for Chronic Disease Management Needs   Start Date: 04/14/2022  Expected End Date: 07/14/2022  Priority: High  Note:   Timeframe:  Long-Range Goal Priority:  High Start Date:      04/14/22                       Expected End Date:     ongoing                  Follow Up Date 06/18/22   - schedule appointment for flu shot - schedule appointment for vaccines needed due to my age or health - schedule recommended  health tests (blood work, mammogram, colonoscopy, pap test) - schedule and keep appointment for annual check-up    Why is this important?   Screening tests can find diseases early when they are easier to treat.  Your doctor or nurse will talk with you about which tests are important for you.  Getting shots for common diseases like the flu and shingles will help prevent them.    05/19/22:  Patient has upcoming appointments with PCP and Orthopedist   Patient verbalizes understanding of instructions and care plan provided today and agrees to view in [place]. Active MyChart status and patient understanding of how to access instructions and care plan via MyChart confirmed with patient.     The Managed Medicaid care management team will reach out to the patient again over the next 30 days.  The  Patient  has been provided with contact information for the Managed Medicaid care management team and has been advised to call with any health related questions or concerns.   Aida Raider RN, BSN [place] Management Coordinator - Managed Medicaid High Risk [phone]   Following is a copy of your plan of care:  Care Plan : [place] of Care  Updates made by Gayla Medicus, RN since 05/19/2022 12:00 AM     Problem: Health Promotion or Disease Self-Management ([place]  of Care)   Priority: High  Onset Date: 04/14/2022     Long-Range Goal: Chronic Disease Management and Care Coordination Needs   Start Date: 04/14/2022  Expected End Date: 07/14/2022  Recent Progress: Not on track  Priority: High  Note:   Current Barriers:  Knowledge Deficits related to plan of care for management of Anxiety and Depression along with other chronic health conditions, HTN, DM Care Coordination needs related to need for possible CBG and referral to general surgeon regarding gastric bypass surgery that patient is interested in and ? Hernia per patient Chronic Disease  Management support and education needs related to Anxiety and Depression. DM, HTN  RNCM Clinical Goal(s):  Patient will verbalize understanding of plan for management of Anxiety and Depression, DM, HTN  as evidenced by patient report take all medications exactly as prescribed and will call provider for medication related questions as evidenced by patient report demonstrate understanding of rationale for each prescribed medication as evidenced by patient report attend all scheduled medical appointments  as evidenced by patient report continue to work with RN Care Manager to address care management and care coordination needs related to  Anxiety and Depression, DM, HTN as evidenced by adherence to CM Team Scheduled appointments work with Education officer, museum to address  related to the management of Mental Health Concerns  related to the management of Anxiety and Depression as evidenced by review of EMR and patient or Education officer, museum report through collaboration with Consulting civil engineer, provider, and care team.   Interventions: Inter-disciplinary care team collaboration (see longitudinal plan of care) Evaluation of current treatment plan related to  self management and patient's adherence to plan as established by provider   (Status:  New goal.)  Long Term Goal Evaluation of current treatment plan related to Anxiety and Depression, Mental Health Concerns  self-management and patient's adherence to plan as established by provider. Discussed plans with patient for ongoing care management follow up and provided patient with direct contact information for care management team Advised patient to contact provider for any additional DME needs, CBG, and possible general surgeon referral. Reviewed medications with patient Collaborated with LCSW regarding anxiety/depression Reviewed scheduled/upcoming provider appointments Advised patient, providing education and rationale, to check cbg and record, calling provider  for  findings outside established parameters  Social Work referral for anxiety/depression Discussed plans with patient for ongoing care management follow up and provided patient with direct contact information for care management team Assessed social determinant of health barriers  Patient Goals/Self-Care Activities: Take all medications as prescribed Attend all scheduled provider appointments Call pharmacy for medication refills 3-7 days in advance of running out of medications Perform all self care activities independently  Perform IADL's (shopping, preparing meals, housekeeping, managing finances) independently Call provider office for new concerns or questions  Work with the social worker to address care coordination needs and will continue to work with the clinical team to address health care and disease management related needs  Follow Up Plan:  The patient has been provided with contact information for the care management team and has been advised to call with any health related questions or concerns.  The care management team will reach out to the patient again over the next 30 business  days.

## 2022-05-19 NOTE — Patient Outreach (Signed)
Medicaid Managed Care   Nurse Care Manager Note  05/19/2022 Name:  [name] MRN:  [id number] DOB:  07-21-1961  [name] is an 61 y.o. year old female who is a primary patient of [name] Dame, MD.  [facility] Managed Care Coordination team was consulted for assistance with:    Chronic healthcare management needs, HTN, DM, CHF, CAD, asthma, COPD, OSA, GERD, anxiety/depression, arthritis, h/o breast cancer  [name] was given information about Medicaid Managed Care Coordination team services today. [name] Patient agreed to services and verbal consent obtained.  Engaged with patient by telephone for follow up visit in response to provider referral for case management and/or care coordination services.   Assessments/Interventions:  Review of past medical history, allergies, medications, health status, including review of consultants reports, laboratory and other test data, was performed as part of comprehensive evaluation and provision of chronic care management services.  SDOH (Social Determinants of Health) assessments and interventions performed: SDOH Interventions    Flowsheet Row Most Recent Value  SDOH Interventions   Food Insecurity Interventions Other (Comment)  [patient provided resources]  Housing Interventions Intervention Not Indicated     Care Plan  No Known Allergies  Medications Reviewed Today     Reviewed by Gayla Medicus, RN (Registered Nurse) on 05/19/22 at 1259  Med List Status: <None>   Medication Order Taking? Sig Documenting Provider Last Dose Status Informant  Accu-Chek FastClix Lancets MISC [id number] No Check blood sugar two times a day Lorella Nimrod, MD Taking Active Self  albuterol (PROVENTIL) (2.5 MG/3ML) 0.083% nebulizer solution [id number]  USE 1 VIAL IN NEBULIZER EVERY 4 HOURS AS NEEDED FOR WHEEZING FOR SHORTNESS OF Loyal Gambler, MD  Active   amlodipine-atorvastatin (CADUET) 5-80 MG tablet [id number] No  Take 1 tablet by mouth daily. [name] Dame, MD 03/06/2022 0730 Active Self  Ascorbic Acid (VITAMIN C) 1000 MG tablet [id number] No Take 1,000 mg by mouth daily. [provider] Past Month Active Self  ASPIRIN ADULT LOW STRENGTH 81 MG EC tablet [id number] No TAKE 1 Tablet BY MOUTH ONCE DAILY Lorella Nimrod, MD 02/27/2022 Active Self  Blood Glucose Monitoring Suppl (ACCU-CHEK GUIDE) w/Device KIT [id number] No 1 each by Does not apply route 2 (two) times daily. Lorella Nimrod, MD Taking Active Self  busPIRone (BUSPAR) 5 MG tablet [id number] No TAKE 1 TABLET BY MOUTH THREE TIMES DAILY  Patient taking differently: 10 mg.   [name] Dame, MD 03/06/2022 0730 Active Self  cefdinir (OMNICEF) 300 MG capsule [id number]  Take 1 capsule (300 mg total) by mouth 2 (two) times daily for 10 days. [name] Scales, PA-C  Active   clopidogrel (PLAVIX) 75 MG tablet [id number] No TAKE 1 TABLET BY MOUTH IN THE [name], MD 02/27/2022 Active Self  docusate sodium (COLACE) 100 MG capsule [id number] No Take 1 capsule (100 mg total) by mouth 2 (two) times daily.  Patient not taking: Reported on 04/14/2022   [name], MD Not Taking Active Self  DULoxetine (CYMBALTA) 60 MG capsule [id number] No Take 1 capsule by mouth once daily [name] Dame, MD 03/06/2022 0730 Active Self  Evolocumab (REPATHA SURECLICK) 233 MG/ML [name] [id number]  INJECT 1 PEN INTO THE SKIN EVERY 14 DAYS Burnell Blanks, MD  Active   exemestane (AROMASIN) 25 MG tablet [id number] No Take 1 tablet (25 mg total) by mouth daily after breakfast. [name] Pike, MD 03/06/2022 0730 Active Self  ezetimibe (ZETIA) 10 MG tablet [id number] No Take 1 tablet (10  mg total) by mouth daily. [name], PA-C 03/06/2022 0730 Active   fexofenadine (ALLEGRA) 180 MG tablet [id number]  Take 1 tablet (180 mg total) by mouth daily. [name] Scales, PA-C  Active   fluticasone (FLONASE) 50 MCG/ACT nasal spray [id number]  Place 1 spray into  both nostrils daily. Begin by using 2 sprays in each nare daily for 3 to 5 days, then decrease to 1 spray in each nare daily. [name] Scales, PA-C  Active   Discontinued 05/09/20 1429   Fluticasone-Umeclidin-Vilant (TRELEGY ELLIPTA) 100-62.5-25 MCG/ACT AEPB [id number] No INHALE 1 PUFF ONCE DAILY Strength: 100-62.5-25 MCG/ACT Lacinda Axon, MD 03/06/2022 0730 Active Self  furosemide (LASIX) 40 MG tablet [id number] No Take 1 tablet (40 mg total) by mouth as needed for fluid or edema. [name], MD Past Week Active Self  gabapentin (NEURONTIN) 300 MG capsule [id number]  TAKE 3 CAPSULES BY MOUTH AT BEDTIME AND 1 IN THE MORNING [name], MD  Active   glucose blood (ACCU-CHEK GUIDE) test strip [id number] No Check blood sugar 2 times per day [name] Dame, MD Taking Active Self  Insulin Pen Needle 30G X 5 MM MISC [id number] No 1 application by Does not apply route daily. [name] Dame, MD Taking Active Self  liraglutide (VICTOZA) 18 MG/3ML SOPN [id number] No INJECT 1.8 MG INTO THE SKIN ONCE DAILY Strength: 18 MG/3ML Lacinda Axon, MD 03/05/2022 Active Self  losartan-hydrochlorothiazide (HYZAAR) 100-25 MG tablet [id number] No Take 1 tablet by mouth daily. [name] Dame, MD 03/05/2022 Active Self  methocarbamol (ROBAXIN) 500 MG tablet [id number]  Take 1 tablet (500 mg total) by mouth every 6 (six) hours as needed for muscle spasms. [name], MD  Active   metoprolol succinate (TOPROL XL) 50 MG 24 hr tablet [id number] No Take 1 tablet (50 mg total) by mouth daily. Take with or immediately following a meal. [name] Dame, MD 03/06/2022 0730 Active Self  montelukast (SINGULAIR) 10 MG tablet [id number]  Take 1 tablet (10 mg total) by mouth at bedtime. [name] Scales, PA-C  Active   Potassium 99 MG TABS [id number] No Take 99 mg by mouth daily. [provider] Past Month Active Self  traMADol (ULTRAM) 50 MG tablet [id number]  Take 1 tablet (50 mg total) by mouth  every 12 (twelve) hours as needed. 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   Healthcare maintenance 11/17/2021   Stress reaction 11/17/2021   Spondylolisthesis, lumbar region 02/11/2021    Class: Acute   Malignant neoplasm of upper-outer quadrant of right breast in female, estrogen receptor positive ([place]) 06/10/2018   Restless leg syndrome 02/04/2018   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   Depression 08/06/2015   Hypertension associated with diabetes ([place])    Aortic stenosis    Chronic diastolic congestive heart failure (HCC)    COPD (chronic obstructive pulmonary disease) ([place]) 09/21/2013   Obstructive sleep apnea 09/21/2013   Nocturnal hypoxemia 09/12/2013   Hyperlipidemia 11/30/2012   History of pulmonary embolus (PE) 07/09/2012   Normocytic anemia 02/08/2012   Type 2 diabetes mellitus without complication, without long-term current use of  insulin ([place]) 02/08/2012   Asthma 02/08/2012   GERD (gastroesophageal reflux disease)    CAD (coronary artery disease) 12/08/2011   Conditions to be addressed/monitored per PCP order:  Chronic healthcare management needs, HTN, DM, CHF, CAD, asthma, COPD, OSA, GERD, anxiety/depression, arthritis, h/o breast cancer  Care Plan : RN Care Manager Plan of Care  Updates made by Gayla Medicus, RN since 05/19/2022 12:00 AM     Problem: Health Promotion or Disease Self-Management (General Plan of Care)   Priority: High  Onset Date: 04/14/2022     Long-Range Goal: Chronic Disease Management and Care Coordination Needs   Start Date: 04/14/2022  Expected End Date: 07/14/2022  Recent Progress: Not on track   Priority: High  Note:   Current Barriers:  Knowledge Deficits related to plan of care for management of Anxiety and Depression along with other chronic health conditions, HTN, DM Care Coordination needs related to need for possible CBG and referral to general surgeon regarding gastric bypass surgery that patient is interested in and ? Hernia per patient Chronic Disease Management support and education needs related to Anxiety and Depression. DM, HTN  RNCM Clinical Goal(s):  Patient will verbalize understanding of plan for management of Anxiety and Depression, DM, HTN  as evidenced by patient report take all medications exactly as prescribed and will call provider for medication related questions as evidenced by patient report demonstrate understanding of rationale for each prescribed medication as evidenced by patient report attend all scheduled medical appointments  as evidenced by patient report continue to work with RN Care Manager to address care management and care coordination needs related to  Anxiety and Depression, DM, HTN as evidenced by adherence to CM Team Scheduled appointments work with Education officer, museum to address  related to the management of Mental Health Concerns  related to the management of Anxiety and Depression as evidenced by review of EMR and patient or Education officer, museum report through collaboration with Consulting civil engineer, provider, and care team.   Interventions: Inter-disciplinary care team collaboration (see longitudinal plan of care) Evaluation of current treatment plan related to  self management and patient's adherence to plan as established by provider   (Status:  New goal.)  Long Term Goal Evaluation of current treatment plan related to Anxiety and Depression, Mental Health Concerns  self-management and patient's adherence to plan as established by provider. Discussed plans with patient for ongoing care management follow up and provided patient with direct contact  information for care management team Advised patient to contact provider for any additional DME needs, CBG, and possible general surgeon referral. Reviewed medications with patient Collaborated with LCSW regarding anxiety/depression Reviewed scheduled/upcoming provider appointments Advised patient, providing education and rationale, to check cbg and record, calling provider  for findings outside established parameters  Social Work referral for anxiety/depression Discussed plans with patient for ongoing care management follow up and provided patient with direct contact information for care management team Assessed social determinant of health barriers  Patient Goals/Self-Care Activities: Take all medications as prescribed Attend all scheduled provider appointments Call pharmacy for medication refills 3-7 days in advance of running out of medications Perform all self care activities independently  Perform IADL's (shopping, preparing meals, housekeeping, managing finances) independently Call provider office for new concerns or questions  Work with the social worker to address care coordination needs and will continue to work with the clinical team to address health care and disease management related needs  Follow Up Plan:  The patient has been provided  with contact information for the care management team and has been advised to call with any health related questions or concerns.  The care management team will reach out to the patient again over the next 30 business  days.   Long-Range Goal: Establish Plan of Care for Chronic Disease Management Needs   Start Date: 04/14/2022  Expected End Date: 07/14/2022  Priority: High  Note:   Timeframe:  Long-Range Goal Priority:  High Start Date:      04/14/22                       Expected End Date:     ongoing                  Follow Up Date 06/18/22   - schedule appointment for flu shot - schedule appointment for vaccines needed due to my age or  health - schedule recommended health tests (blood work, mammogram, colonoscopy, pap test) - schedule and keep appointment for annual check-up    Why is this important?   Screening tests can find diseases early when they are easier to treat.  Your doctor or nurse will talk with you about which tests are important for you.  Getting shots for common diseases like the flu and shingles will help prevent them.    05/19/22:  Patient has upcoming appointments with PCP and Orthopedist   Follow Up:  Patient agrees to Care Plan and Follow-up.  Plan: The Managed Medicaid care management team will reach out to the patient again over the next 30 days. and The  Patient has been provided with contact information for the Managed Medicaid care management team and has been advised to call with any health related questions or concerns.  Date/time of next scheduled RN care management/care coordination outreach:  06/18/22 at 1230.

## 2022-05-21 ENCOUNTER — Other Ambulatory Visit: Payer: Self-pay | Admitting: Licensed Clinical Social Worker

## 2022-05-21 NOTE — Patient Instructions (Signed)
Visit Information  [name] was given information about Medicaid Managed Care team care coordination services as a part of their [facility] Medicaid benefit. [name] verbally consented to engagement with the [facility] Managed Care team.   If you are experiencing a medical emergency, please call 911 or report to your local emergency department or urgent care.   If you have a non-emergency medical problem during routine business hours, please contact your provider's office and ask to speak with a nurse.   For questions related to your [facility] health plan, please call: [phone] or go here:[email or web address]  If you would like to schedule transportation through your [facility] plan, please call the following number at least 2 days in advance of your appointment: [phone].  You can also use the MTM portal or MTM mobile app to manage your rides. For the portal, please go to [email or web address].  Call the [place] at [phone], at any time, 24 hours a day, 7 days a week. If you are in danger or need immediate medical attention call 911.  If you would like help to quit smoking, call 1-800-QUIT-NOW (669) 879-8527) OR Espaol: 1-855-Djelo-Ya (1-607-371-0626) o para ms informacin haga clic aqu or Text READY to 200-400 to register via text  Following is a copy of your plan of care:  Care Plan : LCSW Plan of Care  Updates made by [name] Cutter, LCSW since 05/21/2022 12:00 AM     Problem: Depression Identification (Depression)      Long-Range Goal: I want to start mental health treatment   Start Date: 04/17/2022  Priority: High  Note:   Priority: High  Timeframe:  Long-Range Goal Priority:  High Start Date:   04/17/22                Expected End Date:  ongoing                     Follow Up Date--06/04/22  - check out counseling - keep 90 percent of counseling appointments - schedule counseling appointment    Why is this  important?             Beating depression may take some time.            If you don't feel better right away, don't give up on your treatment plan.    Current barriers:   Chronic Mental Health needs related to depression, stress and anxiety. Patient requires Support, Education, Resources, Referrals, Advocacy, and Care Coordination, in order to meet Unmet Mental Health Needs. Patient will implement clinical interventions discussed today to decrease symptoms of depression and increase knowledge and/or ability of: coping skills. Mental Health Concerns and Social Isolation Patient lacks knowledge of available community counseling agencies and resources.  Clinical Goal(s): verbalize understanding of plan for management of Anxiety, Depression, and Stress and demonstrate a reduction in symptoms. Patient will connect with a provider for ongoing mental health treatment, increase coping skills, healthy habits, self-management skills, and stress reduction         Patient Goals/Self-Care Activities: Over the next 120 days Attend scheduled medical appointments Utilize healthy coping skills and supportive resources discussed Contact PCP with any questions or concerns Keep 90 percent of counseling appointments Call your insurance provider for more information about your Enhanced Benefits  Check out counseling resources provided  Begin personal counseling with LCSW, to reduce and manage symptoms of Depression and Stress, until well-established with mental health provider Accept all calls from  representative with [facility] in an effort to establish ongoing mental health counseling and supportive services. Incorporate into daily practice - relaxation techniques, deep breathing exercises, and mindfulness meditation strategies. Talk about feelings with friends, family members, spiritual advisor, etc. Contact LCSW directly [phone]), if you have questions, need assistance, or if additional social work needs are  identified between now and our next scheduled telephone outreach call. Call 988 for mental health hotline/crisis line if needed (24/7 available) Try techniques to reduce symptoms of anxiety/negative thinking (deep breathing, distraction, positive self talk, etc)  - develop a personal safety plan - develop a plan to deal with triggers like holidays, anniversaries - exercise at least 2 to 3 times per week - have a plan for how to handle bad days - journal feelings and what helps to feel better or worse - spend time or talk with others at least 2 to 3 times per week - watch for early signs of feeling worse - begin personal counseling - call and visit an old friend - check out volunteer opportunities - join a support group - laugh; watch a funny movie or comedian - learn and use visualization or guided imagery - perform a random act of kindness - practice relaxation or meditation daily - start or continue a personal journal - practice positive thinking and self-talk -continue with compliance of taking medication   10 LITTLE Things To Do When You're Feeling Too Down To Do Anything  Take a shower. Even if you plan to stay in all day long and not see a soul, take a shower. It takes the most effort to hop in to the shower but once you do, you'll feel immediate results. It will wake you up and you'll be feeling much fresher (and cleaner too).  Brush and floss your teeth. Give your teeth a good brushing with a floss finish. It's a small task but it feels so good and you can check 'taking care of your health' off the list of things to do.  Do something small on your list. Most of [place] have some small thing we would like to get done (load of laundry, sew a button, email a friend). Doing one of these things will make you feel like you've accomplished something.  Drink water. Drinking water is easy right? It's also really beneficial for your health so keep a glass beside you all day and take sips  often. It gives you energy and prevents you from boredom eating.  Do some floor exercises. The last thing you want to do is exercise but it might be just the thing you need the most. Keep it simple and do exercises that involve sitting or laying on the floor. Even the smallest of exercises release chemicals in the brain that make you feel good. Yoga stretches or core exercises are going to make you feel good with minimal effort.  Make your bed. Making your bed takes a few minutes but it's productive and you'll feel relieved when it's done. An unmade bed is a huge visual reminder that you're having an unproductive day. Do it and consider it your housework for the day.  Put on some nice clothes. Take the sweatpants off even if you don't plan to go anywhere. Put on clothes that make you feel good. Take a look in the mirror so your brain recognizes the sweatpants have been replaced with clothes that make you look great. It's an instant confidence booster.  Wash the dishes. A pile of dirty  dishes in the sink is a reflection of your mood. It's possible that if you wash up the dishes, your mood will follow suit. It's worth a try.  Cook a real meal. If you have the luxury to have a "do nothing" day, you have time to make a real meal for yourself. Make a meal that you love to eat. The process is good to get you out of the funk and the food will ensure you have more energy for tomorrow.  Write out your thoughts by hand. When you hand write, you stimulate your brain to focus on the moment that you're in so make yourself comfortable and write whatever comes into your mind. Put those thoughts out on paper so they stop spinning around in your head. Those thoughts might be the very thing holding you down.  [name], BSW, MSW, [employer] Managed Medicaid LCSW [email or web address] Phone: [phone]

## 2022-05-21 NOTE — Patient Outreach (Signed)
Medicaid Managed Care Social Work Note  05/21/2022 Name:  [name] MRN:  [id number] DOB:  08-Dec-1961  [name] is an 61 y.o. year old female who is a primary patient of [name], MD.  The [facility] team was consulted for assistance with:  [place] and Resources  Ms. Wulff was given information about Medicaid Managed Care Coordination team services today. [name] Patient agreed to services and verbal consent obtained.  Engaged with patient  for by telephone forfollow up visit in response to referral for case management and/or care coordination services.   Assessments/Interventions:  Review of past medical history, allergies, medications, health status, including review of consultants reports, laboratory and other test data, was performed as part of comprehensive evaluation and provision of chronic care management services.  SDOH: (Social Determinant of Health) assessments and interventions performed: SDOH Interventions    Flowsheet Row Most Recent Value  SDOH Interventions   Stress Interventions Offered [employer], Provide Counseling  Depression Interventions/Treatment  Medication, Counseling       Advanced Directives Status:  See Care Plan for related entries.  Care Plan                 No Known Allergies  Medications Reviewed Today     Reviewed by Gayla Medicus, RN (Registered Nurse) on 05/19/22 at 1259  Med List Status: <None>   Medication Order Taking? Sig Documenting Provider Last Dose Status Informant  Accu-Chek FastClix Lancets MISC [id number] No Check blood sugar two times a day Lorella Nimrod, MD Taking Active Self  albuterol (PROVENTIL) (2.5 MG/3ML) 0.083% nebulizer solution [id number]  USE 1 VIAL IN NEBULIZER EVERY 4 HOURS AS NEEDED FOR WHEEZING FOR SHORTNESS OF Loyal Gambler, MD  Active   amlodipine-atorvastatin (CADUET) 5-80 MG tablet [id number] No Take 1  tablet by mouth daily. [name], MD 03/06/2022 0730 Active Self  Ascorbic Acid (VITAMIN C) 1000 MG tablet [id number] No Take 1,000 mg by mouth daily. [provider] Past Month Active Self  ASPIRIN ADULT LOW STRENGTH 81 MG EC tablet [id number] No TAKE 1 Tablet BY MOUTH ONCE DAILY Lorella Nimrod, MD 02/27/2022 Active Self  Blood Glucose Monitoring Suppl (ACCU-CHEK GUIDE) w/Device KIT [id number] No 1 each by Does not apply route 2 (two) times daily. Lorella Nimrod, MD Taking Active Self  busPIRone (BUSPAR) 5 MG tablet [id number] No TAKE 1 TABLET BY MOUTH THREE TIMES DAILY  Patient taking differently: 10 mg.   [name], MD 03/06/2022 0730 Active Self  cefdinir (OMNICEF) 300 MG capsule [id number]  Take 1 capsule (300 mg total) by mouth 2 (two) times daily for 10 days. [name], PA-C  Active   clopidogrel (PLAVIX) 75 MG tablet [id number] No TAKE 1 TABLET BY MOUTH IN THE [name], MD 02/27/2022 Active Self  docusate sodium (COLACE) 100 MG capsule [id number] No Take 1 capsule (100 mg total) by mouth 2 (two) times daily.  Patient not taking: Reported on 04/14/2022   [name], MD Not Taking Active Self  DULoxetine (CYMBALTA) 60 MG capsule [id number] No Take 1 capsule by mouth once daily [name], MD 03/06/2022 0730 Active Self  Evolocumab (REPATHA SURECLICK) 384 MG/ML [name] [id number]  INJECT 1 PEN INTO THE SKIN EVERY 14 DAYS Burnell Blanks, MD  Active   exemestane (AROMASIN) 25 MG tablet [id number] No Take 1 tablet (25 mg total) by mouth daily after breakfast. [name], MD 03/06/2022 0730 Active Self  ezetimibe (  ZETIA) 10 MG tablet [id number] No Take 1 tablet (10 mg total) by mouth daily. [name], PA-C 03/06/2022 0730 Active   fexofenadine (ALLEGRA) 180 MG tablet [id number]  Take 1 tablet (180 mg total) by mouth daily. [name], PA-C  Active   fluticasone (FLONASE) 50 MCG/ACT nasal spray [id number]  Place 1 spray into both  nostrils daily. Begin by using 2 sprays in each nare daily for 3 to 5 days, then decrease to 1 spray in each nare daily. [name], PA-C  Active   Discontinued 05/09/20 1429   Fluticasone-Umeclidin-Vilant (TRELEGY ELLIPTA) 100-62.5-25 MCG/ACT AEPB [id number] No INHALE 1 PUFF ONCE DAILY Strength: 100-62.5-25 MCG/ACT Lacinda Axon, MD 03/06/2022 0730 Active Self  furosemide (LASIX) 40 MG tablet [id number] No Take 1 tablet (40 mg total) by mouth as needed for fluid or edema. [name], MD Past Week Active Self  gabapentin (NEURONTIN) 300 MG capsule [id number]  TAKE 3 CAPSULES BY MOUTH AT BEDTIME AND 1 IN THE MORNING [name], MD  Active   glucose blood (ACCU-CHEK GUIDE) test strip [id number] No Check blood sugar 2 times per day [name], MD Taking Active Self  Insulin Pen Needle 30G X 5 MM MISC [id number] No 1 application by Does not apply route daily. [name], MD Taking Active Self  liraglutide (VICTOZA) 18 MG/3ML SOPN [id number] No INJECT 1.8 MG INTO THE SKIN ONCE DAILY Strength: 18 MG/3ML Lacinda Axon, MD 03/05/2022 Active Self  losartan-hydrochlorothiazide (HYZAAR) 100-25 MG tablet [id number] No Take 1 tablet by mouth daily. [name], MD 03/05/2022 Active Self  methocarbamol (ROBAXIN) 500 MG tablet [id number]  Take 1 tablet (500 mg total) by mouth every 6 (six) hours as needed for muscle spasms. [name], MD  Active   metoprolol succinate (TOPROL XL) 50 MG 24 hr tablet [id number] No Take 1 tablet (50 mg total) by mouth daily. Take with or immediately following a meal. [name], MD 03/06/2022 0730 Active Self  montelukast (SINGULAIR) 10 MG tablet [id number]  Take 1 tablet (10 mg total) by mouth at bedtime. [name], PA-C  Active   Potassium 99 MG TABS [id number] No Take 99 mg by mouth daily. [provider] Past Month Active Self  traMADol (ULTRAM) 50 MG tablet [id number]  Take 1 tablet (50 mg total) by mouth every 12  (twelve) hours as needed. 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)    Aortic stenosis    Chronic diastolic congestive heart failure (HCC)    COPD (chronic obstructive pulmonary disease) ([place]) 09/21/2013   Obstructive sleep apnea 09/21/2013   Nocturnal hypoxemia 09/12/2013   Hyperlipidemia 11/30/2012   History of pulmonary embolus (PE) 07/09/2012   Normocytic anemia 02/08/2012  Type 2 diabetes mellitus without complication, without long-term current use of insulin ([place]) 02/08/2012   Asthma 02/08/2012   GERD (gastroesophageal reflux disease)    CAD (coronary artery disease) 12/08/2011    Conditions to be addressed/monitored per PCP order:  Anxiety and Depression  Care Plan : LCSW Plan of Care  Updates made by Greg Cutter, LCSW since 05/21/2022 12:00 AM     Problem: Depression Identification (Depression)      Long-Range Goal: I want to start mental health treatment   Start Date: 04/17/2022  Priority: High  Note:   Priority: High  Timeframe:  Long-Range Goal Priority:  High Start Date:   04/17/22                Expected End Date:  ongoing                     Follow Up Date--06/04/22  - check out counseling - keep 90 percent of counseling  appointments - schedule counseling appointment    Why is this important?             Beating depression may take some time.            If you don't feel better right away, don't give up on your treatment plan.    Current barriers:   Chronic Mental Health needs related to depression, stress and anxiety. Patient requires Support, Education, Resources, Referrals, Advocacy, and Care Coordination, in order to meet Unmet Mental Health Needs. Patient will implement clinical interventions discussed today to decrease symptoms of depression and increase knowledge and/or ability of: coping skills. Mental Health Concerns and Social Isolation Patient lacks knowledge of available community counseling agencies and resources.  Clinical Goal(s): verbalize understanding of plan for management of Anxiety, Depression, and Stress and demonstrate a reduction in symptoms. Patient will connect with a provider for ongoing mental health treatment, increase coping skills, healthy habits, self-management skills, and stress reduction        Clinical Interventions:  Assessed patient's previous and current treatment, coping skills, support system and barriers to care. Patient provided history. Patient and spouse lived in a shared home with two other adults. Patient reports that this is a current and stable living situation.  Verbalization of feelings encouraged, motivational interviewing employed Emotional support provided, positive coping strategies explored Self care/establishing healthy boundaries emphasized Patient reports that she takes medication for anxiety and depression. She reports that this has alleviated some of symptoms but not all. She reports that she still has crying spells. She needs help with finding psychiatry and counseling. She was successful in identifying triggers to anxiety and depression symptoms, in addition, to healthy coping skills.  Patient reports significant worsening anxiety and depression  impacting her ability to function appropriately and carry out daily task. Patient receives strong support from spouse but he does not have anxiety or depression and is unable to relate to her and her daily mental health struggles.  Patient is agreeable to referral to [facility] for counseling and psychiatry. [facility] LCSW made referral on 04/17/22. Patient will call [facility] tomorrow on 04/18/22 to schedule counseling and psychiatry appointments. Email sent to her with [facility]'s contact information.  LCSW provided education on relaxation techniques such as meditation, deep breathing, massage, grounding exercises or yoga that can activate the body's relaxation response and ease symptoms of stress and anxiety. LCSW ask that when pt is struggling with difficult emotions and racing thoughts that they start this relaxation response process. LCSW  provided extensive education on healthy coping skills for anxiety. SW used active and reflective listening, validated patient's feelings/concerns, and provided emotional support. Patient will work on implementing appropriate self-care habits into their daily routine such as: staying positive, writing a gratitude list, drinking water, staying active around the house, taking their medications as prescribed, combating negative thoughts or emotions and staying connected with their family and friends. Positive reinforcement provided for this decision to work on this. Patient was receptive to anxiety and depression management coping skill education. She will try to increase her socialization over the next 90 days to decrease isolation.  Patient has problems with sleep disturbance. LCSW provided education on healthy sleep hygiene and what that looks like. LCSW encouraged patient to implement a night time routine into her schedule that works best for her and that she is able to maintain. Advised patient to implement deep breathing/grounding/meditation/self-care exercises into her nightly routine to  combat racing thoughts at night. LCSW encouraged patient to wake up at the same time each day, make their sleeping environment comfortable, exercise when able, to limit naps and to not eat or drink anything right before bed.  Motivational Interviewing employed Depression screen reviewed  PHQ2/ PHQ9 completed Mindfulness or Relaxation training provided Active listening / Reflection utilized  Advance Care and HCPOA education provided Emotional Support Provided Problem [place] strategies reviewed Provided psychoeducation for mental health needs  Provided brief CBT  Reviewed mental health medications and discussed importance of compliance:  Quality of sleep assessed & Sleep Hygiene techniques promoted  Participation in counseling encouraged  Verbalization of feelings encouraged  Suicidal Ideation/Homicidal Ideation assessed: Patient denies SI/HI  Review resources, discussed options and provided patient information about  [place] care team collaboration (see longitudinal plan of care) UPDATE 05/21/22- Patient was successfully set up with [facility] at [facility] but was not set up with psychiatry. She is in need of both services. Patient has an upcoming counseling session at [facility] on 06/24/22. [facility] LCSW completed joint call with patient to [facility] but was unable to reach anyone. [facility] LCSW left a voice message asking for them to return call to patient to get her scheduled with psychiatry as [facility] LCSW ask for both psychiatry and counseling in referral. [facility] LCSW ask if they needed an additional referral to please let [facility] LCSW know so this can be completed. [facility] LCSW sent patient an email with [facility]'s contact information including their address as well as a list of coping skills for depression and anxiety. Patient's spouse has multiple health conditions and tells patient that he "wishes to die" often which concerns her. [facility] LCSW provided emotional support and brief  coping skill education on stress management.  Patient Goals/Self-Care Activities: Over the next 120 days Attend scheduled medical appointments Utilize healthy coping skills and supportive resources discussed Contact PCP with any questions or concerns Keep 90 percent of counseling appointments Call your insurance provider for more information about your Enhanced Benefits  Check out counseling resources provided  Begin personal counseling with LCSW, to reduce and manage symptoms of Depression and Stress, until well-established with mental health provider Accept all calls from representative with [facility] in an effort to establish ongoing mental health counseling and supportive services. Incorporate into daily practice - relaxation techniques, deep breathing exercises, and mindfulness meditation strategies. Talk about feelings with friends, family members, spiritual advisor, etc. Contact LCSW directly [phone]), if you have questions, need assistance, or if additional social work needs are identified between now and our  next scheduled telephone outreach call. Call 988 for mental health hotline/crisis line if needed (24/7 available) Try techniques to reduce symptoms of anxiety/negative thinking (deep breathing, distraction, positive self talk, etc)  - develop a personal safety plan - develop a plan to deal with triggers like holidays, anniversaries - exercise at least 2 to 3 times per week - have a plan for how to handle bad days - journal feelings and what helps to feel better or worse - spend time or talk with others at least 2 to 3 times per week - watch for early signs of feeling worse - begin personal counseling - call and visit an old friend - check out volunteer opportunities - join a support group - laugh; watch a funny movie or comedian - learn and use visualization or guided imagery - perform a random act of kindness - practice relaxation or meditation daily - start or continue  a personal journal - practice positive thinking and self-talk -continue with compliance of taking medication      05/21/2022   12:53 PM 04/17/2022    3:17 PM 11/14/2021    4:02 PM 08/21/2021    4:33 PM 12/10/2020    4:00 PM  Depression screen PHQ 2/9  Decreased Interest '2 2 1 ' 0 1  Down, Depressed, Hopeless '2 2 1 ' 0 0  PHQ - 2 Score '4 4 2 ' 0 1  Altered sleeping '3 3 3  1  ' Tired, decreased energy '3 3 1  1  ' Change in appetite '3 3 1  1  ' Feeling bad or failure about yourself  0 1 0  0  Trouble concentrating 0 0 0  0  Moving slowly or fidgety/restless 0  0  0  Suicidal thoughts 0 0 0  0  PHQ-9 Score '13 14 7  4  ' Difficult doing work/chores Somewhat difficult Somewhat difficult Not difficult at all  Not difficult at all      24- Hour Availability:    [facility]  [address] [place], [place] [facility] [phone]   [facility] [employer] [phone]   [place]  [phone]    [place]  [phone] (after hours)   Therapeutic Alternative/Mobile Crisis   [phone]   [place] National Suicide Hotline  513-526-9362 (TALK) OR 988   Call 911 or go to emergency room   [facility]  [phone]);  [place] and [employer]  [phone]); [place], [place], [place], [place], [place], [place], [place]        10 LITTLE Things To Do When You're Feeling Too Down To Do Anything  Take a shower. Even if you plan to stay in all day long and not see a soul, take a shower. It takes the most effort to hop in to the shower but once you do, you'll feel immediate results. It will wake you up and you'll be feeling much fresher (and cleaner too).  Brush and floss your teeth. Give your teeth a good brushing with a floss finish. It's a small task but it feels so good and you can check 'taking care of your health' off the list of things to do.  Do something  small on your list. Most of [place] have some small thing we would like to get done (load of laundry, sew a button, email a friend). Doing one of these things will make you feel like you've accomplished something.  Drink water.  Drinking water is easy right? It's also really beneficial for your health so keep a glass beside you all day and take sips often. It gives you energy and prevents you from boredom eating.  Do some floor exercises. The last thing you want to do is exercise but it might be just the thing you need the most. Keep it simple and do exercises that involve sitting or laying on the floor. Even the smallest of exercises release chemicals in the brain that make you feel good. Yoga stretches or core exercises are going to make you feel good with minimal effort.  Make your bed. Making your bed takes a few minutes but it's productive and you'll feel relieved when it's done. An unmade bed is a huge visual reminder that you're having an unproductive day. Do it and consider it your housework for the day.  Put on some nice clothes. Take the sweatpants off even if you don't plan to go anywhere. Put on clothes that make you feel good. Take a look in the mirror so your brain recognizes the sweatpants have been replaced with clothes that make you look great. It's an instant confidence booster.  Wash the dishes. A pile of dirty dishes in the sink is a reflection of your mood. It's possible that if you wash up the dishes, your mood will follow suit. It's worth a try.  Cook a real meal. If you have the luxury to have a "do nothing" day, you have time to make a real meal for yourself. Make a meal that you love to eat. The process is good to get you out of the funk and the food will ensure you have more energy for tomorrow.  Write out your thoughts by hand. When you hand write, you stimulate your brain to focus on the moment that you're in so make yourself comfortable and write whatever comes into your  mind. Put those thoughts out on paper so they stop spinning around in your head. Those thoughts might be the very thing holding you down.  Patient Goals: Follow up goal         Follow up:  Patient agrees to Care Plan and Follow-up.  Plan: The Managed Medicaid care management team will reach out to the patient again over the next 30 days.  Date/time of next scheduled Social Work care management/care coordination outreach:  06/04/22 at 2:30 pm.  [name], BSW, MSW, [place] Medicaid LCSW [email or web address] Phone: [phone]

## 2022-05-29 DIAGNOSIS — Z419 Encounter for procedure for purposes other than remedying health state, unspecified: Secondary | ICD-10-CM | POA: Diagnosis not present

## 2022-05-30 ENCOUNTER — Ambulatory Visit (INDEPENDENT_AMBULATORY_CARE_PROVIDER_SITE_OTHER): Payer: Medicaid Other | Admitting: Surgical

## 2022-05-30 DIAGNOSIS — T8484XD Pain due to internal orthopedic prosthetic devices, implants and grafts, subsequent encounter: Secondary | ICD-10-CM

## 2022-05-30 DIAGNOSIS — Z96651 Presence of right artificial knee joint: Secondary | ICD-10-CM

## 2022-06-01 ENCOUNTER — Encounter: Payer: Self-pay | Admitting: Surgical

## 2022-06-01 NOTE — Progress Notes (Signed)
Post-Op Visit Note   Patient: [name]           Date of Birth: [birth year]           MRN: [id number] Visit Date: 05/30/2022 PCP: [name], MD   Assessment & Plan:  Chief Complaint:  Chief Complaint  Patient presents with   Left Shoulder - Routine Post Op   Visit Diagnoses:  1. Pain due to total right knee replacement, subsequent encounter     Plan: Patient is a 61 year old female who presents s/p left reverse shoulder arthroplasty on 03/06/2022.  She has stopped with physical therapy formally.  She feels her range of motion has significantly improved.  She has slight pain at times but this is trending better as well.  No fevers or chills.  No drainage from her incision.  No instability of the shoulder joint.  On exam, she has 45 degrees external rotation, 100 degrees abduction, 140 degrees forward flexion.  Active range of motion equivalent to passive range of motion.  Axillary nerve intact with deltoid firing.  Good subscapularis strength.  Plan is to follow-up with the office as needed.  Discussed the restrictions for her shoulder once again.  She is aware that she will need antibiotic prophylaxis prior to dental appointments but she has been doing this due to her history of knee replacement.  She also has concern over her right knee replacement that she previously had done by Dr. Ninfa Linden.  She has had right knee revision total arthroplasty that was about 10 years ago by Dr. Ninfa Linden.  She states the leg feels like it wants to give out on her and buckles on her about 1 time per day. Plan to refer [name] back to see Dr. Ninfa Linden to discuss right knee instability.  Follow-Up Instructions: Return if symptoms worsen or fail to improve.   Orders:  Orders Placed This Encounter  Procedures   Ambulatory referral to Orthopedic Surgery   No orders of the defined types were placed in this encounter.   Imaging: No results found.  PMFS History: Patient Active  Problem List   Diagnosis Date Noted   Arthritis of left shoulder region    S/P reverse total shoulder arthroplasty, 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   Healthcare maintenance 11/17/2021   Stress reaction 11/17/2021   Spondylolisthesis, lumbar region 02/11/2021    Class: Acute   Malignant neoplasm of upper-outer quadrant of right breast in female, estrogen receptor positive ([place]) 06/10/2018   Restless leg syndrome 02/04/2018   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   Depression 08/06/2015   Hypertension associated with diabetes ([place])    Aortic stenosis    Chronic diastolic congestive heart failure ([place])    COPD (chronic obstructive pulmonary disease) ([place]) 09/21/2013   Obstructive sleep apnea 09/21/2013   Nocturnal hypoxemia 09/12/2013   Hyperlipidemia 11/30/2012   History of pulmonary embolus (PE) 07/09/2012   Normocytic anemia 02/08/2012   Type 2 diabetes mellitus without complication, without long-term current use of insulin ([place]) 02/08/2012   Asthma 02/08/2012   GERD (gastroesophageal reflux disease)    CAD (coronary artery disease) 12/08/2011   Past Medical History:  Diagnosis Date   Anemia    Anxiety    Aortic valve stenosis, severe    Arthritis    PAIN AND SEVERE OA LEFT KNEE ; S/P RIGHT TKA ON 02/03/12; HAS LOWER BACK PAIN-UNABLE TO STAND MORE THAN 10 MIN; ARTHRITIS "ALL OVER"   Asthma    Blood transfusion    2013[facility]  Breast cancer in female [facility])    Right   CAD (coronary artery disease)    Cath 2010 with DES x 1 RCA-- PT'S CARDIOLOGIST IS DR. MCALHANY   Chronic diastolic congestive heart failure (HCC)    COPD (chronic obstructive pulmonary disease) (HCC)    Depression    Diabetes mellitus DIAGNOSED IN2010   Dyspnea    with much ambulation   Eczema    on back   Headache    migraines younger- rare now 02/07/21   Heart murmur    no current problems   History of hiatal hernia    History of kidney stones    passed or blasted   Hyperlipidemia    Hypertension     Morbid obesity with body mass index of 60.0-69.9 in adult [facility])    Myocardial infarction ([place])    PT THINKS SHE WAS DX WITH MI AT THE TIME OF HEART STENTING   Neuromuscular disorder (HCC)    bilateral arm/hands   Oxygen dependent    uses 3L oxygen night/prn   Personal history of radiation therapy    Pneumonia    Pulmonary embolism ([place]) 02/08/2012   S/P RT TOTAL KNEE ON 02/03/12--ON 02/08/12--DEVELOPED ACUTE SOB AND CHEST PAIN--AND DIAGNOSED WITH  PULMONARY EMBOLUS AND PNEUMONIA   Restless leg syndrome    Sleep apnea    uses 3 liters O2 at night as needed   Uterine fibroid    NO PROBLEMS AT PRESENT FROM THE FIBROIDS-STATES SHE IS POST MENOPAUSAL-LAST MENSES 2010 EXCEPT FOR EPISODE THIS YR OF BLEEDING RELATED TO FIBROIDS.   Weakness    BOTH HANDS - S/P BILATERAL CARPAL TUNNEL RELEASE--BUT STILL HAS WEAKNESS--OFTEN DROPS THINGS    Family History  Problem Relation Age of Onset   Breast cancer Mother        stage IV at diagnosis   Emphysema Mother        smoked   Heart disease Mother    COPD Father        smoked   Asthma Father    Heart disease Father    Cancer Brother        Sinus    Past Surgical History:  Procedure Laterality Date   BACK SURGERY  02/11/2021   Dr Louanne Skye   BREAST BIOPSY Right 06/04/2018   BREAST LUMPECTOMY Right 06/2018   BREAST LUMPECTOMY WITH RADIOACTIVE SEED AND SENTINEL LYMPH NODE BIOPSY Right 07/19/2018   Procedure: BREAST LUMPECTOMY WITH RADIOACTIVE SEED AND SENTINEL LYMPH NODE BIOPSY;  Surgeon: Alphonsa Overall, MD;  Location: [place];  Service: General;  Laterality: Right;   CARDIAC CATHETERIZATION     CARDIAC VALVE REPLACEMENT     2017   CARPAL TUNNEL RELEASE     Bilateral   CHOLECYSTECTOMY     COLONOSCOPY     CORONARY ANGIOPLASTY     2010 has stent in place   CYSTOSCOPY W/ RETROGRADES Right 09/21/2013   Procedure: CYSTOSCOPY WITH RIGHT RETROGRADE PYELOGRAM RIGHT DOUBLE J STENT ;  Surgeon: Fredricka Bonine, MD;  Location: [facility];  Service:  Urology;  Laterality: Right;   CYSTOSCOPY WITH URETEROSCOPY AND STENT PLACEMENT Right 10/25/2013   Procedure: CYSTOSCOPY RIGHT URETEROSCOPY HOLMIUM LASER LITHO AND STENT PLACEMENT;  Surgeon: Fredricka Bonine, MD;  Location[facility];  Service: Urology;  Laterality: Right;   HERNIA REPAIR     INTRAOPERATIVE TRANSESOPHAGEAL ECHOCARDIOGRAM N/A 12/12/2014   Procedure: INTRAOPERATIVE TRANSESOPHAGEAL ECHOCARDIOGRAM;  Surgeon: Burnell Blanks, MD;  Location: [place];  Service: Cardiovascular;  Laterality: N/A;   JOINT REPLACEMENT     bil total knees   KNEE ARTHROPLASTY  02/03/2012   Procedure: COMPUTER ASSISTED TOTAL KNEE ARTHROPLASTY;  Surgeon: Mcarthur Rossetti, MD;  Location: [place];  Service: Orthopedics;  Laterality: Right;  Right total knee arthroplasty   LEFT AND RIGHT HEART CATHETERIZATION WITH CORONARY ANGIOGRAM N/A 03/17/2013   Procedure: LEFT AND RIGHT HEART CATHETERIZATION WITH CORONARY ANGIOGRAM;  Surgeon: Burnell Blanks, MD;  Location: [facility] CATH LAB;  Service: Cardiovascular;  Laterality: N/A;   LEFT AND RIGHT HEART CATHETERIZATION WITH CORONARY/GRAFT ANGIOGRAM N/A 09/14/2014   Procedure: LEFT AND RIGHT HEART CATHETERIZATION WITH Beatrix Fetters;  Surgeon: Burnell Blanks, MD;  Location[facility] C[facility] LAB;  Service: Cardiovascular;  Laterality: N/A;   REVERSE SHOULDER ARTHROPLASTY Left 03/06/2022   Procedure: LEFT SHOULDER REPLACEMENT APPLICATION OF WOUND VAC;  Surgeon: [name], MD;  Location: [place];  Service: Orthopedics;  Laterality: Left;   TEE WITHOUT CARDIOVERSION N/A 03/14/2013   Procedure: TRANSESOPHAGEAL ECHOCARDIOGRAM (TEE);  Surgeon: Lelon Perla, MD;  Location: [facility] ENDOSCOPY;  Service: Cardiovascular;  Laterality: N/A;   TEE WITHOUT CARDIOVERSION N/A 11/14/2014   Procedure: TRANSESOPHAGEAL ECHOCARDIOGRAM (TEE);  Surgeon: Lelon Perla, MD;  Location: [facility] ENDOSCOPY;  Service: Cardiovascular;  Laterality: N/A;   TONSILLECTOMY     maybe  as a child- does not know   TOTAL KNEE ARTHROPLASTY  09/10/2012   Procedure: TOTAL KNEE ARTHROPLASTY;  Surgeon: Mcarthur Rossetti, MD;  [facility];  Service: Orthopedics;  Laterality: Left;   TOTAL KNEE REVISION Right 07/15/2013   Procedure: REVISION ARTHROPLASTY RIGHT KNEE;  Surgeon: Mcarthur Rossetti, MD;  [facility];  Service: Orthopedics;  Laterality: Right;   TRANSCATHETER AORTIC VALVE REPLACEMENT, TRANSFEMORAL N/A 12/12/2014   Procedure: TRANSCATHETER AORTIC VALVE REPLACEMENT, TRANSFEMORAL;  Surgeon: Burnell Blanks, MD;  Location: [place];  Service: Cardiovascular;  Laterality: N/A;   TRIGGER FINGER RELEASE  09/10/2012   Procedure: RELEASE TRIGGER FINGER/A-1 PULLEY;  Surgeon: Mcarthur Rossetti, MD;  [facility];  Service: Orthopedics;  Laterality: Right;  Right Ring Finger   TUBAL LIGATION     Social History   Occupational History   Occupation: Disabled  Tobacco Use   Smoking status: Former    Packs/day: 1.50    Years: 30.00    Pack years: 45.00    Types: Cigarettes    Quit date: 12/29/2000    Years since quitting: 21.4   Smokeless tobacco: Never  Vaping Use   Vaping Use: Never used  Substance and Sexual Activity   Alcohol use: Not Currently   Drug use: Not Currently   Sexual activity: Not Currently    Birth control/protection: Surgical, Post-menopausal    Comment: tubal ligation

## 2022-06-02 ENCOUNTER — Telehealth: Payer: Self-pay | Admitting: Cardiovascular Disease

## 2022-06-02 NOTE — Telephone Encounter (Signed)
Pt is going to upload the recipe she is going to use as an artery cleanse.  The ingredients include pepper, olive oil and turmeric among several other ingredients.  She wanted to check on whether it will be safe.  She takes asa and plavix.   We will ask pharmacy to review once she sends the ingredients.

## 2022-06-02 NOTE — Telephone Encounter (Signed)
Patient wanted to do a natural artery juice cleanse . She wanted to check with Dr. Angelena Form first before she starts to make sure she starts to make sure it is safe. Please advise

## 2022-06-04 ENCOUNTER — Ambulatory Visit (INDEPENDENT_AMBULATORY_CARE_PROVIDER_SITE_OTHER): Payer: Medicaid Other | Admitting: Internal Medicine

## 2022-06-04 ENCOUNTER — Encounter: Payer: Self-pay | Admitting: Internal Medicine

## 2022-06-04 ENCOUNTER — Ambulatory Visit: Payer: Medicaid Other

## 2022-06-04 ENCOUNTER — Other Ambulatory Visit: Payer: Self-pay

## 2022-06-04 VITALS — BP 127/68 | HR 82 | Temp 98.6°F | Ht 60.0 in | Wt 313.0 lb

## 2022-06-04 DIAGNOSIS — E1169 Type 2 diabetes mellitus with other specified complication: Secondary | ICD-10-CM

## 2022-06-04 DIAGNOSIS — E119 Type 2 diabetes mellitus without complications: Secondary | ICD-10-CM

## 2022-06-04 DIAGNOSIS — I152 Hypertension secondary to endocrine disorders: Secondary | ICD-10-CM

## 2022-06-04 DIAGNOSIS — F419 Anxiety disorder, unspecified: Secondary | ICD-10-CM | POA: Diagnosis not present

## 2022-06-04 DIAGNOSIS — Z794 Long term (current) use of insulin: Secondary | ICD-10-CM

## 2022-06-04 DIAGNOSIS — F3342 Major depressive disorder, recurrent, in full remission: Secondary | ICD-10-CM | POA: Diagnosis not present

## 2022-06-04 DIAGNOSIS — E785 Hyperlipidemia, unspecified: Secondary | ICD-10-CM

## 2022-06-04 DIAGNOSIS — E1159 Type 2 diabetes mellitus with other circulatory complications: Secondary | ICD-10-CM

## 2022-06-04 LAB — POCT GLYCOSYLATED HEMOGLOBIN (HGB A1C): Hemoglobin A1C: 9.6 % — AB (ref 4.0–5.6)

## 2022-06-04 LAB — GLUCOSE, CAPILLARY: Glucose-Capillary: 251 mg/dL — ABNORMAL HIGH (ref 70–99)

## 2022-06-04 MED ORDER — METFORMIN HCL 500 MG PO TABS: 500.0000 mg | ORAL_TABLET | Freq: Two times a day (BID) | ORAL | 0 refills | Status: DC

## 2022-06-04 MED ORDER — BUSPIRONE HCL 10 MG PO TABS: 10.0000 mg | ORAL_TABLET | Freq: Three times a day (TID) | ORAL | 0 refills | Status: AC

## 2022-06-04 MED ORDER — DULOXETINE HCL 30 MG PO CPEP: 30.0000 mg | ORAL_CAPSULE | Freq: Every day | ORAL | 0 refills | Status: DC

## 2022-06-04 MED ORDER — CITALOPRAM HYDROBROMIDE 20 MG PO TABS: 20.0000 mg | ORAL_TABLET | Freq: Every day | ORAL | 0 refills | Status: DC

## 2022-06-04 NOTE — Progress Notes (Signed)
   CC: follow up  HPI:  [name] is a 61 y.o. with medical history as below presenting to [facility] for   Please see problem-based list for further details, assessments, and plans.  Past Medical History:  Diagnosis Date   Anemia    Anxiety    Aortic valve stenosis, severe    Arthritis    PAIN AND SEVERE OA LEFT KNEE ; S/P RIGHT TKA ON 02/03/12; HAS LOWER BACK PAIN-UNABLE TO STAND MORE THAN 10 MIN; ARTHRITIS "ALL OVER"   Asthma    Blood transfusion    2013[facility]   Breast cancer in female [facility])    Right   CAD (coronary artery disease)    Cath 2010 with DES x 1 RCA-- PT'S CARDIOLOGIST IS DR. MCALHANY   Chronic diastolic congestive heart failure (HCC)    COPD (chronic obstructive pulmonary disease) (HCC)    Depression    Diabetes mellitus DIAGNOSED IN2010   Dyspnea    with much ambulation   Eczema    on back   Headache    migraines younger- rare now 02/07/21   Heart murmur    no current problems   History of hiatal hernia    History of kidney stones    passed or blasted   Hyperlipidemia    Hypertension    Morbid obesity with body mass index of 60.0-69.9 in adult [facility])    Myocardial infarction ([place])    PT THINKS SHE WAS DX WITH MI AT THE TIME OF HEART STENTING   Neuromuscular disorder (HCC)    bilateral arm/hands   Oxygen dependent    uses 3L oxygen night/prn   Personal history of radiation therapy    Pneumonia    Pulmonary embolism ([place]) 02/08/2012   S/P RT TOTAL KNEE ON 02/03/12--ON 02/08/12--DEVELOPED ACUTE SOB AND CHEST PAIN--AND DIAGNOSED WITH  PULMONARY EMBOLUS AND PNEUMONIA   Restless leg syndrome    Sleep apnea    uses 3 liters O2 at night as needed   Uterine fibroid    NO PROBLEMS AT PRESENT FROM THE FIBROIDS-STATES SHE IS POST MENOPAUSAL-LAST MENSES 2010 EXCEPT FOR EPISODE THIS YR OF BLEEDING RELATED TO FIBROIDS.   Weakness    BOTH HANDS - S/P BILATERAL CARPAL TUNNEL RELEASE--BUT STILL HAS WEAKNESS--OFTEN DROPS THINGS   Review of Systems:  Review of  system negative unless stated in the problem list or HPI.    Physical Exam:  Vitals:   06/04/22 1413  BP: 127/68  Pulse: 82  Temp: 98.6 F (37 C)  TempSrc: Oral  SpO2: 95%  Weight: (!) 313 lb (142 kg)  Height: 5' (1.524 m)    Physical Exam General: NAD HENT: NCAT Lungs: CTAB, no wheeze, rhonchi or rales.  Cardiovascular: Normal heart sounds, no r/m/g, 2+ pulses in all extremities. No LE edema Abdomen: No TTP, normal bowel sounds MSK: No asymmetry or muscle atrophy.  Skin: no lesions noted on exposed skin Neuro: Alert and oriented x4. CN grossly intact Psych: Normal mood and normal affect   Assessment & Plan:   See Encounters Tab for problem based charting.  Patient discussed with Dr. Denzil Magnuson, MD

## 2022-06-04 NOTE — Patient Instructions (Addendum)
[name], it was a pleasure seeing you today! You endorsed feeling well today. Below are some of the things we talked about this visit. We look forward to seeing you in the follow up appointment!  Today we discussed: We are going to restart metformin since your A1c is suggesting your diabetes is uncontrolled.  We will try to add another medication in 1 to 2 weeks at the follow-up appointment.  I will also send a referral to see our diabetes coordinator for continuous blood glucose monitor. We will also decrease your duloxetine to 30 mg and then stop that medicine after you take that for 1 week and start Celexa back.  Don't take the celexa with duloxetine at the same time. Take 10 mg of the buspirone 3 times a day.  Please keep your appointment for counseling. We will repeat some lab work today to check on your cholesterol and your liver function.  I have ordered the following labs today:  Lab Orders         Glucose, capillary         BMP8+Anion Gap         Lipid Profile         POC Hbg A1C       Referrals ordered today:   Referral Orders  No referral(s) requested today     I have ordered the following medication/changed the following medications:   Stop the following medications: Medications Discontinued During This Encounter  Medication Reason   DULoxetine (CYMBALTA) 60 MG capsule Reorder   busPIRone (BUSPAR) 5 MG tablet Reorder   liraglutide (VICTOZA) 18 MG/3ML SOPN Change in therapy     Start the following medications: Meds ordered this encounter  Medications   metFORMIN (GLUCOPHAGE) 500 MG tablet    Sig: Take 1 tablet (500 mg total) by mouth 2 (two) times daily with a meal. For the first week: take 1 tablet once a day. For the second week: take 1 tablet in the AM and 1 tablet at the evening. For the third week: take 2 tablets (total of 1000 mg) in the AM and 1 tablet at the evening. For the forth week: take 2 tablets (total of 1000 mg) in the AM and 2 tablets  (total of 1000 mg) at the evening.    Dispense:  180 tablet    Refill:  0   busPIRone (BUSPAR) 10 MG tablet    Sig: Take 1 tablet (10 mg total) by mouth 3 (three) times daily.    Dispense:  90 tablet    Refill:  0   DULoxetine (CYMBALTA) 30 MG capsule    Sig: Take 1 capsule (30 mg total) by mouth daily for 7 days.    Dispense:  7 capsule    Refill:  0   citalopram (CELEXA) 20 MG tablet    Sig: Take 1 tablet (20 mg total) by mouth daily. Don't take while taking duloxetine.    Dispense:  30 tablet    Refill:  0   tirzepatide (MOUNJARO) 2.5 MG/0.5ML Pen    Sig: Inject 2.5 mg into the skin once a week for 28 days.    Dispense:  2 mL    Refill:  0     Follow-up: 1-2 week follow up with Dr. Humphrey Rolls and Butch Penny together.   Please make sure to arrive 15 minutes prior to your next appointment. If you arrive late, you may be asked to reschedule.   We look forward to seeing you next  time. Please call our clinic at [phone] if you have any questions or concerns. The best time to call is Monday-Friday from 9am-4pm, but there is someone available 24/7. If after hours or the weekend, call the main hospital number and ask for the Internal Medicine Resident On-Call. If you need medication refills, please notify your pharmacy one week in advance and they will send [place] a request.  Thank you for letting us take part in your care. Wishing you the best!  Thank you, Idamae Schuller, MD

## 2022-06-05 LAB — BMP8+ANION GAP
Anion Gap: 17 mmol/L (ref 10.0–18.0)
BUN/Creatinine Ratio: 17 (ref 12–28)
BUN: 13 mg/dL (ref 8–27)
CO2: 28 mmol/L (ref 20–29)
Calcium: 9.6 mg/dL (ref 8.7–10.3)
Chloride: 93 mmol/L — ABNORMAL LOW (ref 96–106)
Creatinine, Ser: 0.75 mg/dL (ref 0.57–1.00)
Glucose: 235 mg/dL — ABNORMAL HIGH (ref 70–99)
Potassium: 4 mmol/L (ref 3.5–5.2)
Sodium: 138 mmol/L (ref 134–144)
eGFR: 90 mL/min/{1.73_m2} (ref >59)

## 2022-06-05 LAB — LIPID PANEL
Chol/HDL Ratio: 3 ratio (ref 0.0–4.4)
Cholesterol, Total: 164 mg/dL (ref 100–199)
HDL: 54 mg/dL (ref >39)
LDL Chol Calc (NIH): 87 mg/dL (ref 0–99)
Triglycerides: 131 mg/dL (ref 0–149)
VLDL Cholesterol Cal: 23 mg/dL (ref 5–40)

## 2022-06-06 MED ORDER — TIRZEPATIDE 2.5 MG/0.5ML ~~LOC~~ SOAJ: 2.5000 mg | SUBCUTANEOUS | 0 refills | Status: DC

## 2022-06-06 NOTE — Assessment & Plan Note (Signed)
On buspirone 5 mg TID and duloxetine 60 mg qd. PHQ9 was 14. She reported taking 15 mg of buspirone 3 times daily and duloxetine 60 mg daily.  She stated the duloxetine was not doing anything for her.  Per chart review patient's PHQ-9 was 0 while on Celexa and patient endorses better symptom management with that medicine.  Patient has initial therapy session coming up. - Plan to taper duloxetine and start Celexa - Continue buspirone 10 mg 3 times daily - Counseled on follow-up with therapy

## 2022-06-06 NOTE — Assessment & Plan Note (Signed)
BP at goa (< 130/80). BP in clinic today 127/68, HR 82. Reported home readings with SBP 140s, and Dia 60s. Checks twice a month; educated on the importance of it and proper method of checking it. . Home medications include Hyzaar 100-25 mg qd, toprol 50 qd, caudet 5-80 mg, lasix 40 mg prn. Has been taking lasix every day or every other day. Reports good medication compliance. Tolerating medication without adverse effects. Denies headaches, vision changes, lightheadedness, chest pain, SHOB, leg swelling or changes in speech. Exam benign and vitals otherwise wnl. Last creatinine was 0.91 in 02/2022. Counseled on the importance of daily exercise, low salt diet, and weight loss. -Continue bp meds -BP log and bring to next visit  -Continue lifestyle changes -F/u on BMP  Addendum: BMP shows normal creatinine at 0.75 and remarkable for hyperglycemia at 235 and chloride at 93.

## 2022-06-06 NOTE — Progress Notes (Signed)
Internal Medicine Clinic Attending  Case discussed with Dr. Khan  At the time of the visit.  We reviewed the resident's history and exam and pertinent patient test results.  I agree with the assessment, diagnosis, and plan of care documented in the resident's note.  

## 2022-06-06 NOTE — Assessment & Plan Note (Signed)
Assessment/Plan Patient has HLD with goal of secondary prevention given her CAD. Last lipid panel on showed Chol 170, LDL 106 in 01/2022. It was 52 previously. Reports compliance to Amlodipine-atorvastatin 5-'80mg'$  daily, Repatha injection q2 weeks without adverse effects. Started zetia 2 months ago.  Counseled on the importance of continued lifestyle modifications, including: weight loss, daily exercise, and healthy diet with limited processed and fatty foods.  -Continue current meds.  -Lipid panel this visit  Addendum: Lipid panel shows improvement with cholesterol 164 and LDL 87 but not at goal. Will give regimen more time to work before adding another agent.

## 2022-06-06 NOTE — Assessment & Plan Note (Addendum)
Patient's DMII is non-controlled. A1c 8.0 in february 2023, 9.6 today. CBG 251 today. Does not check regularly. Home monitoring with fasting glucose ranging 125-260. Did not check glucose 16 of 31 days last months. Current regimen includes victoza 1.8. Reports good medication adherence. Diabetes not at goal on current regimen, likely 2/2 poor regimen. Tolerating medication without adverse effects. Counseled on the benefits of daily exercise, limiting processed foods and high sugar foods, and weight loss. No hypoglycemic episodes. Denies polydipsia, polyuria, weight loss, lethargy, blurry vision, and changes in sensation. Benign physical exam and vitals wnl.   -Restart Metaformin 500 mg daily and titrate up slowly.  -Change victoza to mounjoro.  Referral to donna for diet and CGM. Will consider adding farxiga but holding right now given her bladder incontinence.  -CTM CBG's and bring glucometer to next visit  -Continue lifestyle modifications

## 2022-06-11 ENCOUNTER — Telehealth: Payer: Self-pay

## 2022-06-11 ENCOUNTER — Telehealth: Payer: Self-pay | Admitting: Hematology and Oncology

## 2022-06-11 NOTE — Telephone Encounter (Signed)
Rescheduled appointment per providers. Patient aware.  ? ?

## 2022-06-11 NOTE — Telephone Encounter (Signed)
Pa  for pt [facility] )  came through on cover my meds  was submitted with Last office notes and labs ..,. Awaiting approval or denial

## 2022-06-11 NOTE — Telephone Encounter (Signed)
DECISION      Outcome  Denied today  Denied. The drug that you asked for is not covered. We are denying your request because we do not show that you have tried at least 2 preferred drugs. You have already tried Victoza. Other covered drug(s) is/are: Bydureon pen, Byetta pen, Trulicity pen, Ozempic injection. Note: Some covered drug(s) may have limits on the quantity you can get. You can get this information on the list of covered drugs (Preferred Drug List) for details.   Drug Mounjaro 2.'5MG'$ /0.5ML pen-injectors   Form [employer] Prior Authorization Request Form [phone] NCPDP)  Original Claim Info 70,MR    ( COPY SENT TO PCP ALSO )

## 2022-06-12 ENCOUNTER — Other Ambulatory Visit: Payer: Self-pay | Admitting: Licensed Clinical Social Worker

## 2022-06-12 NOTE — Patient Outreach (Signed)
Medicaid Managed Care Social Work Note  06/12/2022 Name:  [name] MRN:  [id number] DOB:  November 15, 1961  [name] is an 61 y.o. year old female who is a primary patient of [name], MD.  The [facility] team was consulted for assistance with:  [place] and Resources  Ms. Buley was given information about Medicaid Managed Care Coordination team services today. [name] Patient agreed to services and verbal consent obtained.  Engaged with patient  for by telephone forfollow up visit in response to referral for case management and/or care coordination services.   Assessments/Interventions:  Review of past medical history, allergies, medications, health status, including review of consultants reports, laboratory and other test data, was performed as part of comprehensive evaluation and provision of chronic care management services.  SDOH: (Social Determinant of Health) assessments and interventions performed: SDOH Interventions    Flowsheet Row Most Recent Value  SDOH Interventions   Stress Interventions Offered [employer], Provide Counseling       Advanced Directives Status:  See Care Plan for related entries.  Care Plan                 No Known Allergies  Medications Reviewed Today     Reviewed by Greg Cutter, LCSW (Social Worker) on 06/12/22 at 1544  Med List Status: <None>   Medication Order Taking? Sig Documenting Provider Last Dose Status Informant  Accu-Chek FastClix Lancets MISC [id number] No Check blood sugar two times a day Lorella Nimrod, MD Taking Active Self  albuterol (PROVENTIL) (2.5 MG/3ML) 0.083% nebulizer solution [id number]  USE 1 VIAL IN NEBULIZER EVERY 4 HOURS AS NEEDED FOR WHEEZING FOR SHORTNESS OF Loyal Gambler, MD  Active   amlodipine-atorvastatin (CADUET) 5-80 MG tablet [id number] No Take 1 tablet by mouth daily. [name], MD 03/06/2022 0730  Active Self  Ascorbic Acid (VITAMIN C) 1000 MG tablet [id number] No Take 1,000 mg by mouth daily. [provider] Past Month Active Self  ASPIRIN ADULT LOW STRENGTH 81 MG EC tablet [id number] No TAKE 1 Tablet BY MOUTH ONCE DAILY Lorella Nimrod, MD 02/27/2022 Active Self  Blood Glucose Monitoring Suppl (ACCU-CHEK GUIDE) w/Device KIT [id number] No 1 each by Does not apply route 2 (two) times daily. Lorella Nimrod, MD Taking Active Self  busPIRone (BUSPAR) 10 MG tablet [id number]  Take 1 tablet (10 mg total) by mouth 3 (three) times daily. Idamae Schuller, MD  Active   citalopram (CELEXA) 20 MG tablet [id number]  Take 1 tablet (20 mg total) by mouth daily. Don't take while taking duloxetine. Idamae Schuller, MD  Active   clopidogrel (PLAVIX) 75 MG tablet [id number] No TAKE 1 TABLET BY MOUTH IN THE [name], MD 02/27/2022 Active Self  docusate sodium (COLACE) 100 MG capsule [id number] No Take 1 capsule (100 mg total) by mouth 2 (two) times daily.  Patient not taking: Reported on 04/14/2022   [name], MD Not Taking Active Self  DULoxetine (CYMBALTA) 30 MG capsule [id number]  Take 1 capsule (30 mg total) by mouth daily for 7 days. Idamae Schuller, MD  Expired 06/11/22 2359   Evolocumab (REPATHA SURECLICK) 748 MG/ML [name] [id number]  INJECT 1 PEN INTO THE SKIN EVERY 14 DAYS Burnell Blanks, MD  Active   exemestane (AROMASIN) 25 MG tablet [id number] No Take 1 tablet (25 mg total) by mouth daily after breakfast. [name], MD 03/06/2022 0730 Active Self  ezetimibe (ZETIA) 10 MG tablet [id number] No Take  1 tablet (10 mg total) by mouth daily. [name], PA-C 03/06/2022 0730 Expired 05/26/22 2359   fexofenadine (ALLEGRA) 180 MG tablet [id number]  Take 1 tablet (180 mg total) by mouth daily. [name], PA-C  Active   fluticasone (FLONASE) 50 MCG/ACT nasal spray [id number]  Place 1 spray into both nostrils daily. Begin by using 2 sprays in each nare daily for 3 to 5 days, then  decrease to 1 spray in each nare daily. [name], PA-C  Active   Discontinued 05/09/20 1429   Fluticasone-Umeclidin-Vilant (TRELEGY ELLIPTA) 100-62.5-25 MCG/ACT AEPB [id number] No INHALE 1 PUFF ONCE DAILY Strength: 100-62.5-25 MCG/ACT Lacinda Axon, MD 03/06/2022 0730 Active Self  furosemide (LASIX) 40 MG tablet [id number] No Take 1 tablet (40 mg total) by mouth as needed for fluid or edema. [name], MD Past Week Active Self  gabapentin (NEURONTIN) 300 MG capsule [id number]  TAKE 3 CAPSULES BY MOUTH AT BEDTIME AND 1 IN THE MORNING [name], MD  Active   glucose blood (ACCU-CHEK GUIDE) test strip [id number] No Check blood sugar 2 times per day [name], MD Taking Active Self  Insulin Pen Needle 30G X 5 MM MISC [id number] No 1 application by Does not apply route daily. [name], MD Taking Active Self  losartan-hydrochlorothiazide [facility]) 100-25 MG tablet [id number] No Take 1 tablet by mouth daily. [name], MD 03/05/2022 Active Self  metFORMIN (GLUCOPHAGE) 500 MG tablet [id number]  Take 1 tablet (500 mg total) by mouth 2 (two) times daily with a meal. For the first week: take 1 tablet once a day. For the second week: take 1 tablet in the AM and 1 tablet at the evening. For the third week: take 2 tablets (total of 1000 mg) in the AM and 1 tablet at the evening. For the forth week: take 2 tablets (total of 1000 mg) in the AM and 2 tablets (total of 1000 mg) at the evening. Idamae Schuller, MD  Active   methocarbamol (ROBAXIN) 500 MG tablet [id number]  Take 1 tablet (500 mg total) by mouth every 6 (six) hours as needed for muscle spasms. [name], MD  Active   metoprolol succinate (TOPROL XL) 50 MG 24 hr tablet [id number] No Take 1 tablet (50 mg total) by mouth daily. Take with or immediately following a meal. [name], MD 03/06/2022 0730 Active Self  montelukast (SINGULAIR) 10 MG tablet [id number]  Take 1 tablet (10 mg total) by mouth at bedtime.  [name], PA-C  Active   Potassium 99 MG TABS [id number] No Take 99 mg by mouth daily. [provider] Past Month Active Self  tirzepatide [facility]) 2.5 MG/0.5ML Pen [id number]  Inject 2.5 mg into the skin once a week for 28 days. Idamae Schuller, MD  Active   traMADol Veatrice Bourbon) 50 MG tablet [id number]  Take 1 tablet (50 mg total) by mouth every 12 (twelve) hours as needed. 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   Healthcare maintenance 11/17/2021   Stress reaction 11/17/2021   Spondylolisthesis, lumbar region 02/11/2021    Class: Acute  Malignant neoplasm of upper-outer quadrant of right breast in female, estrogen receptor positive ([place]) 06/10/2018   Restless leg syndrome 02/04/2018   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   Depression 08/06/2015   Hypertension associated with diabetes ([place])    Aortic stenosis    Chronic diastolic congestive heart failure (HCC)    COPD (chronic obstructive pulmonary disease) ([place]) 09/21/2013   Obstructive sleep apnea 09/21/2013   Nocturnal hypoxemia 09/12/2013   Hyperlipidemia 11/30/2012   History of pulmonary embolus (PE) 07/09/2012   Normocytic anemia 02/08/2012   Type 2 diabetes mellitus without complication, without long-term current use of insulin ([place]) 02/08/2012   Asthma 02/08/2012   GERD (gastroesophageal reflux disease)    CAD (coronary artery disease) 12/08/2011    Conditions to be addressed/monitored per PCP order:  Anxiety and Depression  Care Plan : LCSW Plan of Care  Updates made by Greg Cutter, LCSW since 06/12/2022 12:00 AM     Problem: Depression Identification  (Depression)      Long-Range Goal: I want to start mental health treatment   Start Date: 04/17/2022  Priority: High  Note:   Priority: High  Timeframe:  Long-Range Goal Priority:  High Start Date:   04/17/22                Expected End Date:  07/17/22                  Follow Up Date--07/17/22 at 1:00 pm.   - check out counseling - keep 90 percent of counseling appointments - schedule counseling appointment    Why is this important?             Beating depression may take some time.            If you don't feel better right away, don't give up on your treatment plan.    Current barriers:   Chronic Mental Health needs related to depression, stress and anxiety. Patient requires Support, Education, Resources, Referrals, Advocacy, and Care Coordination, in order to meet Unmet Mental Health Needs. Patient will implement clinical interventions discussed today to decrease symptoms of depression and increase knowledge and/or ability of: coping skills. Mental Health Concerns and Social Isolation Patient lacks knowledge of available community counseling agencies and resources.  Clinical Goal(s): verbalize understanding of plan for management of Anxiety, Depression, and Stress and demonstrate a reduction in symptoms. Patient will connect with a provider for ongoing mental health treatment, increase coping skills, healthy habits, self-management skills, and stress reduction        Clinical Interventions:  Assessed patient's previous and current treatment, coping skills, support system and barriers to care. Patient provided history. Patient and spouse lived in a shared home with two other adults. Patient reports that this is a current and stable living situation.  Verbalization of feelings encouraged, motivational interviewing employed Emotional support provided, positive coping strategies explored Self care/establishing healthy boundaries emphasized Patient reports that she takes medication for  anxiety and depression. She reports that this has alleviated some of symptoms but not all. She reports that she still has crying spells. She needs help with finding psychiatry and counseling. She was successful in identifying triggers to anxiety and depression symptoms, in addition, to healthy coping skills.  Patient reports significant worsening anxiety and depression impacting her ability to function appropriately and carry out daily task. Patient receives strong support from spouse but he does not have anxiety or depression and is unable to relate to her and her daily  mental health struggles.  Patient is agreeable to referral to [facility] for counseling and psychiatry. [facility] LCSW made referral on 04/17/22. Patient will call [facility] tomorrow on 04/18/22 to schedule counseling and psychiatry appointments. Email sent to her with [facility]'s contact information.  LCSW provided education on relaxation techniques such as meditation, deep breathing, massage, grounding exercises or yoga that can activate the body's relaxation response and ease symptoms of stress and anxiety. LCSW ask that when pt is struggling with difficult emotions and racing thoughts that they start this relaxation response process. LCSW provided extensive education on healthy coping skills for anxiety. SW used active and reflective listening, validated patient's feelings/concerns, and provided emotional support. Patient will work on implementing appropriate self-care habits into their daily routine such as: staying positive, writing a gratitude list, drinking water, staying active around the house, taking their medications as prescribed, combating negative thoughts or emotions and staying connected with their family and friends. Positive reinforcement provided for this decision to work on this. Patient was receptive to anxiety and depression management coping skill education. She will try to increase her socialization over the next 90 days to decrease  isolation.  Patient has problems with sleep disturbance. LCSW provided education on healthy sleep hygiene and what that looks like. LCSW encouraged patient to implement a night time routine into her schedule that works best for her and that she is able to maintain. Advised patient to implement deep breathing/grounding/meditation/self-care exercises into her nightly routine to combat racing thoughts at night. LCSW encouraged patient to wake up at the same time each day, make their sleeping environment comfortable, exercise when able, to limit naps and to not eat or drink anything right before bed.  Motivational Interviewing employed Depression screen reviewed  PHQ2/ PHQ9 completed Mindfulness or Relaxation training provided Active listening / Reflection utilized  Advance Care and HCPOA education provided Emotional Support Provided Problem [place] strategies reviewed Provided psychoeducation for mental health needs  Provided brief CBT  Reviewed mental health medications and discussed importance of compliance:  Quality of sleep assessed & Sleep Hygiene techniques promoted  Participation in counseling encouraged  Verbalization of feelings encouraged  Suicidal Ideation/Homicidal Ideation assessed: Patient denies SI/HI  Review resources, discussed options and provided patient information about  [place] care team collaboration (see longitudinal plan of care) UPDATE 05/21/22- Patient was successfully set up with counseling at [facility] but was not set up with psychiatry. She is in need of both services. Patient has an upcoming counseling session at [facility] on 06/24/22. [facility] LCSW completed joint call with patient to [facility] but was unable to reach anyone. [facility] LCSW left a voice message asking for them to return call to patient to get her scheduled with psychiatry as [facility] LCSW ask for both psychiatry and counseling in referral. [facility] LCSW ask if they needed an additional  referral to please let [facility] LCSW know so this can be completed. [facility] LCSW sent patient an email with [facility]'s contact information including their address as well as a list of coping skills for depression and anxiety. Patient's spouse has multiple health conditions and tells patient that he "wishes to die" often which concerns her. [facility] LCSW provided emotional support and brief coping skill education on stress management.  UPDATE 06/12/22- Patient successfully contacted [facility] and set up appointments for both psychiatry and counseling. Patient is eager to start treatment. Brief self-care education provided to patient. Patient is agreeable to contact [facility] LCSW directly if needed. [facility] LCSW will make one  last follow up on call to ensure that patient was successfully connected to a long term mental health provider. Patient denies any current crises or concerns.  Patient Goals/Self-Care Activities: Over the next 120 days Attend scheduled medical appointments Utilize healthy coping skills and supportive resources discussed Contact PCP with any questions or concerns Keep 90 percent of counseling appointments Call your insurance provider for more information about your Enhanced Benefits  Check out counseling resources provided  Begin personal counseling with LCSW, to reduce and manage symptoms of Depression and Stress, until well-established with mental health provider Accept all calls from representative with [facility] in an effort to establish ongoing mental health counseling and supportive services. Incorporate into daily practice - relaxation techniques, deep breathing exercises, and mindfulness meditation strategies. Talk about feelings with friends, family members, spiritual advisor, etc. Contact LCSW directly [phone]), if you have questions, need assistance, or if additional social work needs are identified between now and our next scheduled telephone outreach call. Call 988 for mental health hotline/crisis  line if needed (24/7 available) Try techniques to reduce symptoms of anxiety/negative thinking (deep breathing, distraction, positive self talk, etc)  - develop a personal safety plan - develop a plan to deal with triggers like holidays, anniversaries - exercise at least 2 to 3 times per week - have a plan for how to handle bad days - journal feelings and what helps to feel better or worse - spend time or talk with others at least 2 to 3 times per week - watch for early signs of feeling worse - begin personal counseling - call and visit an old friend - check out volunteer opportunities - join a support group - laugh; watch a funny movie or comedian - learn and use visualization or guided imagery - perform a random act of kindness - practice relaxation or meditation daily - start or continue a personal journal - practice positive thinking and self-talk -continue with compliance of taking medication      05/21/2022   12:53 PM 04/17/2022    3:17 PM 11/14/2021    4:02 PM 08/21/2021    4:33 PM 12/10/2020    4:00 PM  Depression screen PHQ 2/9  Decreased Interest '2 2 1 ' 0 1  Down, Depressed, Hopeless '2 2 1 ' 0 0  PHQ - 2 Score '4 4 2 ' 0 1  Altered sleeping '3 3 3  1  ' Tired, decreased energy '3 3 1  1  ' Change in appetite '3 3 1  1  ' Feeling bad or failure about yourself  0 1 0  0  Trouble concentrating 0 0 0  0  Moving slowly or fidgety/restless 0  0  0  Suicidal thoughts 0 0 0  0  PHQ-9 Score '13 14 7  4  ' Difficult doing work/chores Somewhat difficult Somewhat difficult Not difficult at all  Not difficult at all      24- Hour Availability:    [facility]  [address] [place], [place] [facility] [phone]   [facility] [employer] [phone]   [place]  [phone]    [place]  [phone] (after hours)   Therapeutic Alternative/Mobile Crisis   [phone]   [place]  National Suicide Hotline  (434)159-6291 (TALK) OR 988   Call 911 or go to emergency room   [facility]  [phone]);  [place] and [employer]  [phone]); [place], [place], [place], [place], [place],  [place], [place]        10 LITTLE Things To Do When You're Feeling Too Down To Do Anything  Take a shower. Even if you plan to stay in all day long and not see a soul, take a shower. It takes the most effort to hop in to the shower but once you do, you'll feel immediate results. It will wake you up and you'll be feeling much fresher (and cleaner too).  Brush and floss your teeth. Give your teeth a good brushing with a floss finish. It's a small task but it feels so good and you can check 'taking care of your health' off the list of things to do.  Do something small on your list. Most of [place] have some small thing we would like to get done (load of laundry, sew a button, email a friend). Doing one of these things will make you feel like you've accomplished something.  Drink water. Drinking water is easy right? It's also really beneficial for your health so keep a glass beside you all day and take sips often. It gives you energy and prevents you from boredom eating.  Do some floor exercises. The last thing you want to do is exercise but it might be just the thing you need the most. Keep it simple and do exercises that involve sitting or laying on the floor. Even the smallest of exercises release chemicals in the brain that make you feel good. Yoga stretches or core exercises are going to make you feel good with minimal effort.  Make your bed. Making your bed takes a few minutes but it's productive and you'll feel relieved when it's done. An unmade bed is a huge visual reminder that you're having an unproductive day. Do it and consider it your housework for the day.  Put on some nice clothes. Take the sweatpants off even if you don't plan to go anywhere.  Put on clothes that make you feel good. Take a look in the mirror so your brain recognizes the sweatpants have been replaced with clothes that make you look great. It's an instant confidence booster.  Wash the dishes. A pile of dirty dishes in the sink is a reflection of your mood. It's possible that if you wash up the dishes, your mood will follow suit. It's worth a try.  Cook a real meal. If you have the luxury to have a "do nothing" day, you have time to make a real meal for yourself. Make a meal that you love to eat. The process is good to get you out of the funk and the food will ensure you have more energy for tomorrow.  Write out your thoughts by hand. When you hand write, you stimulate your brain to focus on the moment that you're in so make yourself comfortable and write whatever comes into your mind. Put those thoughts out on paper so they stop spinning around in your head. Those thoughts might be the very thing holding you down.  Patient Goals: Follow up goal         Follow up:  Patient agrees to Care Plan and Follow-up.  Plan: The Managed Medicaid care management team will reach out to the patient again over the next 30 days.  Date/time of next scheduled Social Work care management/care coordination outreach:  07/17/22 at 1:00 pm.   [name], BSW, MSW, [place] Medicaid LCSW [email or web address] Phone: [phone]

## 2022-06-12 NOTE — Patient Instructions (Signed)
Visit Information  Ms. Witzke was given information about Medicaid Managed Care team care coordination services as a part of their [facility] Medicaid benefit. [name] verbally consented to engagement with the [facility] Managed Care team.   If you are experiencing a medical emergency, please call 911 or report to your local emergency department or urgent care.   If you have a non-emergency medical problem during routine business hours, please contact your provider's office and ask to speak with a nurse.   For questions related to your [facility] health plan, please call: [phone] or go here:[email or web address]  If you would like to schedule transportation through your [facility] plan, please call the following number at least 2 days in advance of your appointment: [phone].  You can also use the MTM portal or MTM mobile app to manage your rides. For the portal, please go to [email or web address].  Call the [place] at [phone], at any time, 24 hours a day, 7 days a week. If you are in danger or need immediate medical attention call 911.  If you would like help to quit smoking, call 1-800-QUIT-NOW (1-5055901229) OR Espaol: 1-855-Djelo-Ya   Following is a copy of your plan of care:  Care Plan : LCSW Plan of Care  Updates made by Greg Cutter, LCSW since 06/12/2022 12:00 AM     Problem: Depression Identification (Depression)      Long-Range Goal: I want to start mental health treatment   Start Date: 04/17/2022  Priority: High  Note:   Priority: High  Timeframe:  Long-Range Goal Priority:  High Start Date:   04/17/22                Expected End Date:  07/17/22                  Follow Up Date--07/17/22 at 1:00 pm.   - check out counseling - keep 90 percent of counseling appointments - schedule counseling appointment    Why is this important?             Beating depression may take some time.            If you don't  feel better right away, don't give up on your treatment plan.    Current barriers:   Chronic Mental Health needs related to depression, stress and anxiety. Patient requires Support, Education, Resources, Referrals, Advocacy, and Care Coordination, in order to meet Unmet Mental Health Needs. Patient will implement clinical interventions discussed today to decrease symptoms of depression and increase knowledge and/or ability of: coping skills. Mental Health Concerns and Social Isolation Patient lacks knowledge of available community counseling agencies and resources.  Clinical Goal(s): verbalize understanding of plan for management of Anxiety, Depression, and Stress and demonstrate a reduction in symptoms. Patient will connect with a provider for ongoing mental health treatment, increase coping skills, healthy habits, self-management skills, and stress reduction        Patient Goals/Self-Care Activities: Over the next 120 days Attend scheduled medical appointments Utilize healthy coping skills and supportive resources discussed Contact PCP with any questions or concerns Keep 90 percent of counseling appointments Call your insurance provider for more information about your Enhanced Benefits  Check out counseling resources provided  Begin personal counseling with LCSW, to reduce and manage symptoms of Depression and Stress, until well-established with mental health provider Accept all calls from representative with [facility] in an effort to establish ongoing mental health counseling and supportive services. Incorporate  into daily practice - relaxation techniques, deep breathing exercises, and mindfulness meditation strategies. Talk about feelings with friends, family members, spiritual advisor, etc. Contact LCSW directly [phone]), if you have questions, need assistance, or if additional social work needs are identified between now and our next scheduled telephone outreach call. Call [phone] for  mental health hotline/crisis line if needed (24/7 available) Try techniques to reduce symptoms of anxiety/negative thinking (deep breathing, distraction, positive self talk, etc)  - develop a personal safety plan - develop a plan to deal with triggers like holidays, anniversaries - exercise at least 2 to 3 times per week - have a plan for how to handle bad days - journal feelings and what helps to feel better or worse - spend time or talk with others at least 2 to 3 times per week - watch for early signs of feeling worse - begin personal counseling - call and visit an old friend - check out volunteer opportunities - join a support group - laugh; watch a funny movie or comedian - learn and use visualization or guided imagery - perform a random act of kindness - practice relaxation or meditation daily - start or continue a personal journal - practice positive thinking and self-talk -continue with compliance of taking medication      05/21/2022   12:53 PM 04/17/2022    3:17 PM 11/14/2021    4:02 PM 08/21/2021    4:33 PM 12/10/2020    4:00 PM  Depression screen PHQ 2/9  Decreased Interest '2 2 1 '$ 0 1  Down, Depressed, Hopeless '2 2 1 '$ 0 0  PHQ - 2 Score '4 4 2 '$ 0 1  Altered sleeping '3 3 3  1  '$ Tired, decreased energy '3 3 1  1  '$ Change in appetite '3 3 1  1  '$ Feeling bad or failure about yourself  0 1 0  0  Trouble concentrating 0 0 0  0  Moving slowly or fidgety/restless 0  0  0  Suicidal thoughts 0 0 0  0  PHQ-9 Score '13 14 7  4  '$ Difficult doing work/chores Somewhat difficult Somewhat difficult Not difficult at all  Not difficult at all      24- Hour Availability:    [facility]  [address] [place], [place] Crisis [phone]   Family Service of the [employer] [phone]   [place]  [phone]    [facility]  [phone] (after hours)   Therapeutic Alternative/Mobile Crisis    [phone]   [place] National Suicide Hotline  312-041-4886 (TALK) OR 988   Call 911 or go to emergency room   [facility]  [phone]);  [place] and [employer]  [phone]); [place], [place], [place], [place], [place], [place], [place]        10 LITTLE Things To Do When You're Feeling Too Down To Do Anything  Take a shower. Even if you plan to stay in all day long and not see a soul, take a shower. It takes the most effort to hop in to the shower but once you do, you'll feel immediate results. It will wake you up and you'll be feeling much fresher (and cleaner too).  Brush and floss your teeth. Give your teeth a good brushing with a floss finish. It's a small task but it feels so good and you can check 'taking care of your health' off the list of things to do.  Do something small on your list. Most of [place] have some small thing we would like to get done (load of laundry, sew a button, email a friend). Doing one of these things will make you feel like you've accomplished something.  Drink water. Drinking water is easy right? It's also really beneficial for your health so keep a glass beside you all day and take sips often. It gives you energy and prevents you from boredom eating.  Do some floor exercises. The last thing you want to do is exercise but it might be just the thing you need the most. Keep it simple and do exercises that involve sitting or laying on the floor. Even the smallest of exercises release chemicals in the brain that make you feel good. Yoga stretches or core exercises are going to make you feel good with minimal effort.  Make your bed. Making your bed takes a few minutes but it's productive and you'll feel relieved when it's done. An unmade bed is a huge visual reminder that you're having an unproductive day. Do it and consider it your housework for the day.  Put on some nice clothes. Take the sweatpants off even if you don't  plan to go anywhere. Put on clothes that make you feel good. Take a look in the mirror so your brain recognizes the sweatpants have been replaced with clothes that make you look great. It's an instant confidence booster.  Wash the dishes. A pile of dirty dishes in the sink is a reflection of your mood. It's possible that if you wash up the dishes, your mood will follow suit. It's worth a try.  Cook a real meal. If you have the luxury to have a "do nothing" day, you have time to make a real meal for yourself. Make a meal that you love to eat. The process is good to get you out of the funk and the food will ensure you have more energy for tomorrow.  Write out your thoughts by hand. When you hand write, you stimulate your brain to focus on the moment that you're in so make yourself comfortable and write whatever comes into your mind. Put those thoughts out on paper so they stop spinning around in your head. Those thoughts might be the very thing holding you down.  Patient Goals: Follow up goal  [name], BSW, MSW, [employer] Managed Medicaid LCSW [email or web address] Phone: [phone]

## 2022-06-13 MED ORDER — TRULICITY 0.75 MG/0.5ML ~~LOC~~ SOAJ: 0.7500 mg | SUBCUTANEOUS | 0 refills | Status: DC

## 2022-06-13 NOTE — Progress Notes (Signed)
Patient called and given labs results. Her Darcel Bayley was not covered so I started Trulicity for the patient and notified the patient. Patient's Celexa was covered and she will start that on Monday after discontinuing her Cymbalta. Instructed the patient to make follow up as advised during the visit.

## 2022-06-13 NOTE — Telephone Encounter (Signed)
I changed it to Trulicity and notified the patient of this change as well.

## 2022-06-13 NOTE — Addendum Note (Signed)
Addended by: Idamae Schuller on: 06/13/2022 11:33 AM   Modules accepted: Orders

## 2022-06-13 NOTE — Addendum Note (Signed)
Addended by: Idamae Schuller on: 06/13/2022 11:47 AM   Modules accepted: Orders

## 2022-06-16 ENCOUNTER — Encounter: Payer: Self-pay | Admitting: Orthopaedic Surgery

## 2022-06-16 ENCOUNTER — Ambulatory Visit (INDEPENDENT_AMBULATORY_CARE_PROVIDER_SITE_OTHER): Payer: Medicaid Other | Admitting: Orthopaedic Surgery

## 2022-06-16 ENCOUNTER — Ambulatory Visit (INDEPENDENT_AMBULATORY_CARE_PROVIDER_SITE_OTHER): Payer: Medicaid Other

## 2022-06-16 DIAGNOSIS — M25561 Pain in right knee: Secondary | ICD-10-CM

## 2022-06-16 DIAGNOSIS — Z96651 Presence of right artificial knee joint: Secondary | ICD-10-CM

## 2022-06-16 DIAGNOSIS — G8929 Other chronic pain: Secondary | ICD-10-CM

## 2022-06-16 NOTE — Progress Notes (Signed)
The patient is a 61 year old female that I have not seen in a long period of time.  In 2013 she had a right knee total knee arthroplasty.  She had a revision of this in 2014.  She feels like the knee does come out of place at times and I think she is talking more about the patella itself.  On exam there is no warmth to the knee and there is no effusion.  There is no malalignment of the knee and it feels ligamentously stable.  I do feel that the patella has some weakness to it and may sublux a little bit laterally.  2 views of the right knee show well-seated total knee arthroplasty that is revision arthroplasty with no evidence of loosening at all.  There is long stems and a large cement mantle around both femoral and tibial components.  VMO strengthening and weight loss is going to help her the most.  However, she does not wish to go to physical therapy and she feels that Medicaid will not cover enough of this.  She would like to try knee brace and we will see if we can get 1 to fit around her large body habitus.  Her last weight earlier in June was 313 pounds.  If we cannot get a brace from in house to fit we will need to see about getting her set up for 1 measured to fit around her knee.  Has really all that I would recommend for her right now.  I can certainly see her back in 3 months after trial of bracing.  I do want her to work on quad strengthening exercises.  At her next visit no x-rays be needed but I would like a weight and BMI calculation.  12 days ago her hemoglobin A1c was 9.6.  I counseled her about this as well.

## 2022-06-18 ENCOUNTER — Other Ambulatory Visit: Payer: Self-pay | Admitting: Obstetrics and Gynecology

## 2022-06-18 DIAGNOSIS — E119 Type 2 diabetes mellitus without complications: Secondary | ICD-10-CM | POA: Diagnosis not present

## 2022-06-18 DIAGNOSIS — H01132 Eczematous dermatitis of right lower eyelid: Secondary | ICD-10-CM | POA: Diagnosis not present

## 2022-06-18 DIAGNOSIS — H02834 Dermatochalasis of left upper eyelid: Secondary | ICD-10-CM | POA: Diagnosis not present

## 2022-06-18 DIAGNOSIS — H2513 Age-related nuclear cataract, bilateral: Secondary | ICD-10-CM | POA: Diagnosis not present

## 2022-06-18 DIAGNOSIS — H02831 Dermatochalasis of right upper eyelid: Secondary | ICD-10-CM | POA: Diagnosis not present

## 2022-06-18 DIAGNOSIS — H01131 Eczematous dermatitis of right upper eyelid: Secondary | ICD-10-CM | POA: Diagnosis not present

## 2022-06-18 DIAGNOSIS — H40053 Ocular hypertension, bilateral: Secondary | ICD-10-CM | POA: Diagnosis not present

## 2022-06-18 DIAGNOSIS — H43822 Vitreomacular adhesion, left eye: Secondary | ICD-10-CM | POA: Diagnosis not present

## 2022-06-18 DIAGNOSIS — H01135 Eczematous dermatitis of left lower eyelid: Secondary | ICD-10-CM | POA: Diagnosis not present

## 2022-06-18 DIAGNOSIS — H0102B Squamous blepharitis left eye, upper and lower eyelids: Secondary | ICD-10-CM | POA: Diagnosis not present

## 2022-06-18 DIAGNOSIS — H01134 Eczematous dermatitis of left upper eyelid: Secondary | ICD-10-CM | POA: Diagnosis not present

## 2022-06-18 LAB — HM DIABETES EYE EXAM

## 2022-06-18 NOTE — Patient Instructions (Signed)
  Hi [name], sorry I missed you today, I hope you ar doing okay - as a part of your Medicaid benefit, you are eligible for care management and care coordination services at no cost or copay. I was unable to reach you by phone today but would be happy to help you with your health related needs. Please feel free to call me at [phone]  A member of the Managed Medicaid care management team will reach out to you again over the next 30 business  days.   Aida Raider RN, BSN [place]  [facility] - Managed Medicaid High Risk [phone]

## 2022-06-18 NOTE — Patient Outreach (Signed)
Care Coordination  06/18/2022  [name] 29-May-1961 [id number]   Medicaid Managed Care   Unsuccessful Outreach Note  06/18/2022 Name: [name] MRN: [id number] DOB: 10-06-1961  Referred by: [name], MD Reason for referral : High Risk Managed Medicaid (Unsuccessful telephone outreach)   An unsuccessful telephone outreach was attempted today. The patient was referred to the case management team for assistance with care management and care coordination.   Follow Up Plan: The care management team will reach out to the patient again over the next 30 business  days.   Aida Raider RN, BSN [place]  [facility] - Managed Medicaid High Risk [phone]

## 2022-06-19 ENCOUNTER — Encounter: Payer: Self-pay | Admitting: Dietician

## 2022-06-19 ENCOUNTER — Ambulatory Visit (INDEPENDENT_AMBULATORY_CARE_PROVIDER_SITE_OTHER): Payer: Medicaid Other | Admitting: Dietician

## 2022-06-19 ENCOUNTER — Ambulatory Visit (INDEPENDENT_AMBULATORY_CARE_PROVIDER_SITE_OTHER): Payer: Medicaid Other | Admitting: Internal Medicine

## 2022-06-19 VITALS — BP 145/73 | HR 82 | Temp 99.1°F | Ht 63.0 in | Wt 310.0 lb

## 2022-06-19 DIAGNOSIS — H5213 Myopia, bilateral: Secondary | ICD-10-CM | POA: Diagnosis not present

## 2022-06-19 DIAGNOSIS — E119 Type 2 diabetes mellitus without complications: Secondary | ICD-10-CM | POA: Diagnosis not present

## 2022-06-19 DIAGNOSIS — E1169 Type 2 diabetes mellitus with other specified complication: Secondary | ICD-10-CM

## 2022-06-19 DIAGNOSIS — R300 Dysuria: Secondary | ICD-10-CM | POA: Diagnosis not present

## 2022-06-19 DIAGNOSIS — R32 Unspecified urinary incontinence: Secondary | ICD-10-CM | POA: Diagnosis not present

## 2022-06-19 DIAGNOSIS — D649 Anemia, unspecified: Secondary | ICD-10-CM | POA: Diagnosis not present

## 2022-06-19 DIAGNOSIS — G2581 Restless legs syndrome: Secondary | ICD-10-CM

## 2022-06-19 DIAGNOSIS — Z794 Long term (current) use of insulin: Secondary | ICD-10-CM

## 2022-06-19 DIAGNOSIS — L304 Erythema intertrigo: Secondary | ICD-10-CM

## 2022-06-19 DIAGNOSIS — N390 Urinary tract infection, site not specified: Secondary | ICD-10-CM

## 2022-06-19 MED ORDER — INSULIN GLARGINE 100 UNIT/ML ~~LOC~~ SOLN: SUBCUTANEOUS | 3 refills | Status: DC

## 2022-06-19 NOTE — Patient Instructions (Addendum)
[name],   Thank you for your visit today!  Call this number for help with your sensor: [phone].   Please return in 1-2 weeks to see myself and your doctor.   Please call to let us know which Continuous glucose monitoring you would like [place] to order- either the freestyle Libre 2 or the Dexcom G6. Both have readers if needed. You can call me at [phone]  Happy to help as needed in between visits.   Butch Penny [phone]

## 2022-06-19 NOTE — Progress Notes (Unsigned)
CC: diabetes follow up  HPI:  [name] is a 61 y.o. with medical history as below presenting to [facility] for diabetes follow up.   Please see problem-based list for further details, assessments, and plans.  Past Medical History:  Diagnosis Date   Anemia    Anxiety    Aortic valve stenosis, severe    Arthritis    PAIN AND SEVERE OA LEFT KNEE ; S/P RIGHT TKA ON 02/03/12; HAS LOWER BACK PAIN-UNABLE TO STAND MORE THAN 10 MIN; ARTHRITIS "ALL OVER"   Asthma    Blood transfusion    [facility]   Breast cancer in female [facility])    Right   CAD (coronary artery disease)    Cath 2010 with DES x 1 RCA-- PT'S CARDIOLOGIST IS DR. MCALHANY   Chronic diastolic congestive heart failure (HCC)    COPD (chronic obstructive pulmonary disease) (HCC)    Depression    Diabetes mellitus DIAGNOSED IN2010   Dyspnea    with much ambulation   Eczema    on back   Headache    migraines younger- rare now 02/07/21   Heart murmur    no current problems   History of hiatal hernia    History of kidney stones    passed or blasted   Hyperlipidemia    Hypertension    Morbid obesity with body mass index of 60.0-69.9 in adult [facility])    Myocardial infarction ([place])    PT THINKS SHE WAS DX WITH MI AT THE TIME OF HEART STENTING   Neuromuscular disorder (HCC)    bilateral arm/hands   Oxygen dependent    uses 3L oxygen night/prn   Personal history of radiation therapy    Pneumonia    Pulmonary embolism ([employer]) 02/08/2012   S/P RT TOTAL KNEE ON 02/03/12--ON 02/08/12--DEVELOPED ACUTE SOB AND CHEST PAIN--AND DIAGNOSED WITH  PULMONARY EMBOLUS AND PNEUMONIA   Restless leg syndrome    Sleep apnea    uses 3 liters O2 at night as needed   Uterine fibroid    NO PROBLEMS AT PRESENT FROM THE FIBROIDS-STATES SHE IS POST MENOPAUSAL-LAST MENSES 2010 EXCEPT FOR EPISODE THIS YR OF BLEEDING RELATED TO FIBROIDS.   Weakness    BOTH HANDS - S/P BILATERAL CARPAL TUNNEL RELEASE--BUT STILL HAS WEAKNESS--OFTEN DROPS THINGS     Current Outpatient Medications (Endocrine & Metabolic):    Dulaglutide (TRULICITY) 3.83 AN/1.9TY SOPN, Inject 0.75 mg into the skin once a week for 28 days.   metFORMIN (GLUCOPHAGE) 500 MG tablet, Take 1 tablet (500 mg total) by mouth 2 (two) times daily with a meal. For the first week: take 1 tablet once a day. For the second week: take 1 tablet in the AM and 1 tablet at the evening. For the third week: take 2 tablets (total of 1000 mg) in the AM and 1 tablet at the evening. For the forth week: take 2 tablets (total of 1000 mg) in the AM and 2 tablets (total of 1000 mg) at the evening.  Current Outpatient Medications (Cardiovascular):    amlodipine-atorvastatin (CADUET) 5-80 MG tablet, Take 1 tablet by mouth daily.   Evolocumab (REPATHA SURECLICK) 606 MG/ML SOAJ, INJECT 1 PEN INTO THE SKIN EVERY 14 DAYS   ezetimibe (ZETIA) 10 MG tablet, Take 1 tablet (10 mg total) by mouth daily.   furosemide (LASIX) 40 MG tablet, Take 1 tablet (40 mg total) by mouth as needed for fluid or edema.   losartan-hydrochlorothiazide (HYZAAR) 100-25 MG tablet, Take 1 tablet  by mouth daily.   metoprolol succinate (TOPROL XL) 50 MG 24 hr tablet, Take 1 tablet (50 mg total) by mouth daily. Take with or immediately following a meal.  Current Outpatient Medications (Respiratory):    albuterol (PROVENTIL) (2.5 MG/3ML) 0.083% nebulizer solution, USE 1 VIAL IN NEBULIZER EVERY 4 HOURS AS NEEDED FOR WHEEZING FOR SHORTNESS OF BREATH   fexofenadine (ALLEGRA) 180 MG tablet, Take 1 tablet (180 mg total) by mouth daily.   fluticasone (FLONASE) 50 MCG/ACT nasal spray, Place 1 spray into both nostrils daily. Begin by using 2 sprays in each nare daily for 3 to 5 days, then decrease to 1 spray in each nare daily.   Fluticasone-Umeclidin-Vilant (TRELEGY ELLIPTA) 100-62.5-25 MCG/ACT AEPB, INHALE 1 PUFF ONCE DAILY Strength: 100-62.5-25 MCG/ACT   montelukast (SINGULAIR) 10 MG tablet, Take 1 tablet (10 mg total) by mouth at bedtime.    VENTOLIN HFA 108 (90 Base) MCG/ACT inhaler, INHALE 2 PUFFS BY MOUTH EVERY 6 HOURS AS NEEDED FOR WHEEZING FOR SHORTNESS OF BREATH  Current Outpatient Medications (Analgesics):    ASPIRIN ADULT LOW STRENGTH 81 MG EC tablet, TAKE 1 Tablet BY MOUTH ONCE DAILY   traMADol (ULTRAM) 50 MG tablet, Take 1 tablet (50 mg total) by mouth every 12 (twelve) hours as needed.  Current Outpatient Medications (Hematological):    clopidogrel (PLAVIX) 75 MG tablet, TAKE 1 TABLET BY MOUTH IN THE MORNING  Current Outpatient Medications (Other):    Accu-Chek FastClix Lancets MISC, Check blood sugar two times a day   Ascorbic Acid (VITAMIN C) 1000 MG tablet, Take 1,000 mg by mouth daily.   Blood Glucose Monitoring Suppl (ACCU-CHEK GUIDE) w/Device KIT, 1 each by Does not apply route 2 (two) times daily.   busPIRone (BUSPAR) 10 MG tablet, Take 1 tablet (10 mg total) by mouth 3 (three) times daily.   citalopram (CELEXA) 20 MG tablet, Take 1 tablet (20 mg total) by mouth daily. Don't take while taking duloxetine.   docusate sodium (COLACE) 100 MG capsule, Take 1 capsule (100 mg total) by mouth 2 (two) times daily. (Patient not taking: Reported on 04/14/2022)   exemestane (AROMASIN) 25 MG tablet, Take 1 tablet (25 mg total) by mouth daily after breakfast.   gabapentin (NEURONTIN) 300 MG capsule, TAKE 3 CAPSULES BY MOUTH AT BEDTIME AND 1 IN THE MORNING   glucose blood (ACCU-CHEK GUIDE) test strip, Check blood sugar 2 times per day   Insulin Pen Needle 30G X 5 MM MISC, 1 application by Does not apply route daily.   methocarbamol (ROBAXIN) 500 MG tablet, Take 1 tablet (500 mg total) by mouth every 6 (six) hours as needed for muscle spasms.   Potassium 99 MG TABS, Take 99 mg by mouth daily.  Review of Systems:  Review of system negative unless stated in the problem list or HPI.    Physical Exam:  Vitals:   06/19/22 1325  BP: (!) 173/83  Pulse: 85  Temp: 99.1 F (37.3 C)  TempSrc: Oral  SpO2: 97%  Weight: (!) 310 lb  (140.6 kg)  Height: '5\' 3"'  (1.6 m)    Physical Exam General: NAD HENT: NCAT Lungs: CTAB, no wheeze, rhonchi or rales.  Cardiovascular: Normal heart sounds, no r/m/g, 2+ pulses in all extremities. No LE edema Abdomen: No TTP, normal bowel sounds MSK: No asymmetry or muscle atrophy.  Skin: no lesions noted on exposed skin Neuro: Alert and oriented x4. CN grossly intact Psych: Normal mood and normal affect   Assessment & Plan:   See  Encounters Tab for problem based charting.  Hx of HTN, HLD, CAD, COPD, T2DM, Depression Patient discussed with Dr. {NAMES:[id number]::"Butcher","Guilloud","Hoffman","Mullen","Narendra","Raines","Vincent"} Idamae Schuller, MD   Assessment/Plan Patient's DMII is uncontrolled.  9.6 this month. Remembers sugars ranging from 167-297. Current regimen includes trulicity 1/47/09 and metformin 500 mg qd but should be on BID. Reports good medication adherence. Diabetes not at goal on current regimen, likely 2/2 diet, and poor follow up. Tolerating medication without adverse effects. Counseled on the benefits of daily exercise, limiting processed foods and high sugar foods, and weight loss. No hypoglycemic episodes. Denies polydipsia, polyuria, weight loss, lethargy, blurry vision, and changes in sensation. No wounds or ulcer of bilateral feet. Benign physical exam and vitals wnl.   Continue trulicity and increase metformin.  Start insulin    CTM CBG's and bring glucometer to next visit  Continue lifestyle modifications  Urinary Incontinence Stress vs urge vs mixed  09/04/2021 to rehab for pelvic floor exercises. Exam noted pelvic floor muscle strength at 2/5 no follow up.   Dysuria and foul smell.  UA to check for bacteria. Reports 3 UTIs last years and 1 this year. No lab data.   Feet hurting at night Has diagnosis of RLS on gabapentin. States it is getting worse.  Check iron levels.

## 2022-06-19 NOTE — Patient Instructions (Addendum)
[name], it was a pleasure seeing you today! You endorsed feeling well today. Below are some of the things we talked about this visit. We look forward to seeing you in the follow up appointment!  Today we discussed: You presented for follow-up for your diabetes.  We will add insulin to your medications.  We will do 25 units of insulin called glargine.  Please inject this every night.  Continue taking the Trulicity weekly.  Please start taking 1 g of metformin this week and increase it to 1500 mg the following week.  We will place a continuous blood glucose monitor and we will follow-up with you in 1 week.  We can make more adjustments then. For your pain with urination we will check your urine to check for an infection. For your leg pain at night we will check some blood work. For the redness under your breast, use over-the-counter medicated powder to prevent moisture. I have ordered the following labs today:  Lab Orders  No laboratory test(s) ordered today      Referrals ordered today:   Referral Orders  No referral(s) requested today     I have ordered the following medication/changed the following medications:   Stop the following medications: Medications Discontinued During This Encounter  Medication Reason   DULoxetine (CYMBALTA) 30 MG capsule Discontinued by provider     Start the following medications: No orders of the defined types were placed in this encounter.    Follow-up: 1 week follow up for diabetes  Please make sure to arrive 15 minutes prior to your next appointment. If you arrive late, you may be asked to reschedule.   We look forward to seeing you next time. Please call our clinic at [phone] if you have any questions or concerns. The best time to call is Monday-Friday from 9am-4pm, but there is someone available 24/7. If after hours or the weekend, call the main hospital number and ask for the Internal Medicine Resident On-Call. If you need  medication refills, please notify your pharmacy one week in advance and they will send [place] a request.  Thank you for letting us take part in your care. Wishing you the best!  Thank you, Idamae Schuller, MD

## 2022-06-19 NOTE — Progress Notes (Signed)
Diabetes Self-Management Education  Visit Type:  Follow-up (from 2019 visit)  Appt. Start Time: 1430 Appt. End Time: 9562  06/19/2022  [name], identified by name and date of birth, is a 61 y.o. female with a diagnosis of Diabetes:  .   ASSESSMENT  Last menstrual period 02/03/2012. There is no height or weight on file to calculate BMI.    Diabetes Self-Management Education - 13/08/65 7846       Complications   How often do you check your blood sugar? 0 times/day (not testing)    Number of hypoglycemic episodes per month 0    Have you had a dilated eye exam in the past 12 months? No    Have you had a dental exam in the past 12 months? No   no teeth   Are you checking your feet? No   cannot see the bottoms     Dietary Intake   Breakfast not done in detail today      Activity / Exercise   Activity / Exercise Type ADL's      Patient Education   Previous Diabetes Education Yes (please comment)   here   Monitoring Taught/evaluated CGM (comment)   wanted to use personal CGM over professional, also wanted to use her phone over a reader. her phone downloaded the [facility] app so she placed a sample sensor and then it would not scan. Unsure if her phone was not compatable vs the case on her phone.            Learning Objective:  Patient will have a greater understanding of diabetes self-management. Patient education plan is to attend individual and/or group sessions per assessed needs and concerns.   Plan:   Patient Instructions  Burn[place],   Thank you for your visit today!  Call this number for help with your sensor: [phone].   Please return in 1-2 weeks to see myself and your doctor.   Please call to let us know which Continuous glucose monitoring you would like [place] to order- either the freestyle Libre 2 or the Dexcom G6. Both have readers if needed. You can call me at [phone]  Happy to help as needed in between visits.   Butch Penny [phone]    Expected Outcomes:  Demonstrated interest in learning. Expect positive outcomes  Education material provided: Diabetes Resources  If problems or questions, patient to contact team via:  Phone  Future DSME appointment: - 2 wks Debera Lat, RD 06/19/2022 5:15 PM.

## 2022-06-20 DIAGNOSIS — L304 Erythema intertrigo: Secondary | ICD-10-CM | POA: Insufficient documentation

## 2022-06-20 DIAGNOSIS — R32 Unspecified urinary incontinence: Secondary | ICD-10-CM | POA: Insufficient documentation

## 2022-06-20 DIAGNOSIS — R300 Dysuria: Secondary | ICD-10-CM | POA: Insufficient documentation

## 2022-06-20 LAB — MICROSCOPIC EXAMINATION
Casts: NONE SEEN /lpf
WBC, UA: >30 /hpf — AB (ref 0–5)

## 2022-06-20 LAB — URINALYSIS, ROUTINE W REFLEX MICROSCOPIC
Bilirubin, UA: NEGATIVE
Glucose, UA: NEGATIVE
Nitrite, UA: POSITIVE — AB
RBC, UA: NEGATIVE
Specific Gravity, UA: 1.023 (ref 1.005–1.030)
Urobilinogen, Ur: 0.2 mg/dL (ref 0.2–1.0)
pH, UA: 5.5 (ref 5.0–7.5)

## 2022-06-20 MED ORDER — NITROFURANTOIN MONOHYD MACRO 100 MG PO CAPS: 100.0000 mg | ORAL_CAPSULE | Freq: Two times a day (BID) | ORAL | 0 refills | Status: AC

## 2022-06-20 NOTE — Assessment & Plan Note (Signed)
Patient has intertrigo that is has been present for a prolonged time. I recommended skin hygiene with medicated powder to prevent humidity and keeping the area as dry as possible.

## 2022-06-20 NOTE — Assessment & Plan Note (Addendum)
Patient has RLS and is taking gabapentin for her RLS. She states her legs are bothering her at night more than usual. Her iron was Ferritin was 27 in 2019.. We will recheck and replete her iron stores if indicated with goal of level >75.  -Iron panel this visit.  -Continue gabapentin  Addendum: Sample hemolyzed and unable to be analyzed. Patient called for recollection and future lab order placed.

## 2022-06-23 ENCOUNTER — Other Ambulatory Visit: Payer: Self-pay

## 2022-06-23 DIAGNOSIS — E119 Type 2 diabetes mellitus without complications: Secondary | ICD-10-CM

## 2022-06-24 ENCOUNTER — Encounter (HOSPITAL_COMMUNITY): Payer: Self-pay

## 2022-06-24 ENCOUNTER — Other Ambulatory Visit: Payer: Self-pay | Admitting: Student

## 2022-06-24 ENCOUNTER — Encounter (HOSPITAL_COMMUNITY): Payer: Self-pay | Admitting: Licensed Clinical Social Worker

## 2022-06-24 ENCOUNTER — Other Ambulatory Visit: Payer: Self-pay | Admitting: Dietician

## 2022-06-24 ENCOUNTER — Ambulatory Visit (INDEPENDENT_AMBULATORY_CARE_PROVIDER_SITE_OTHER): Payer: Medicaid Other | Admitting: Licensed Clinical Social Worker

## 2022-06-24 DIAGNOSIS — Z794 Long term (current) use of insulin: Secondary | ICD-10-CM

## 2022-06-24 DIAGNOSIS — F411 Generalized anxiety disorder: Secondary | ICD-10-CM | POA: Insufficient documentation

## 2022-06-24 DIAGNOSIS — E119 Type 2 diabetes mellitus without complications: Secondary | ICD-10-CM

## 2022-06-24 DIAGNOSIS — F331 Major depressive disorder, recurrent, moderate: Secondary | ICD-10-CM | POA: Diagnosis not present

## 2022-06-24 DIAGNOSIS — F4310 Post-traumatic stress disorder, unspecified: Secondary | ICD-10-CM | POA: Insufficient documentation

## 2022-06-24 MED ORDER — INSULIN GLARGINE-YFGN 100 UNIT/ML ~~LOC~~ SOPN: 25.0000 [IU] | PEN_INJECTOR | Freq: Every day | SUBCUTANEOUS | 2 refills | Status: DC
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
## 2022-06-26 MED ORDER — FREESTYLE LIBRE 3 SENSOR MISC: 11 refills | Status: DC

## 2022-06-27 ENCOUNTER — Other Ambulatory Visit: Payer: Self-pay | Admitting: Specialist

## 2022-06-27 ENCOUNTER — Ambulatory Visit (INDEPENDENT_AMBULATORY_CARE_PROVIDER_SITE_OTHER): Payer: Medicaid Other | Admitting: Student

## 2022-06-27 ENCOUNTER — Encounter: Payer: Self-pay | Admitting: Student

## 2022-06-27 ENCOUNTER — Telehealth: Payer: Self-pay | Admitting: *Deleted

## 2022-06-27 ENCOUNTER — Other Ambulatory Visit: Payer: Self-pay | Admitting: Student

## 2022-06-27 VITALS — BP 132/62 | HR 76 | Temp 99.1°F | Wt 311.2 lb

## 2022-06-27 DIAGNOSIS — E1169 Type 2 diabetes mellitus with other specified complication: Secondary | ICD-10-CM

## 2022-06-27 DIAGNOSIS — Z794 Long term (current) use of insulin: Secondary | ICD-10-CM | POA: Diagnosis not present

## 2022-06-27 DIAGNOSIS — E119 Type 2 diabetes mellitus without complications: Secondary | ICD-10-CM

## 2022-06-27 DIAGNOSIS — G2581 Restless legs syndrome: Secondary | ICD-10-CM | POA: Diagnosis not present

## 2022-06-27 DIAGNOSIS — Z7984 Long term (current) use of oral hypoglycemic drugs: Secondary | ICD-10-CM

## 2022-06-27 DIAGNOSIS — Z7985 Long-term (current) use of injectable non-insulin antidiabetic drugs: Secondary | ICD-10-CM

## 2022-06-27 DIAGNOSIS — J441 Chronic obstructive pulmonary disease with (acute) exacerbation: Secondary | ICD-10-CM

## 2022-06-27 MED ORDER — METFORMIN HCL ER 500 MG PO TB24: 500.0000 mg | ORAL_TABLET | Freq: Two times a day (BID) | ORAL | 2 refills | Status: DC

## 2022-06-27 MED ORDER — TRULICITY 0.75 MG/0.5ML ~~LOC~~ SOAJ: 1.5000 mg | SUBCUTANEOUS | 0 refills | Status: DC

## 2022-06-27 MED ORDER — INSULIN GLARGINE-YFGN 100 UNIT/ML ~~LOC~~ SOPN: 30.0000 [IU] | PEN_INJECTOR | Freq: Every day | SUBCUTANEOUS | 2 refills | Status: DC

## 2022-06-27 NOTE — Patient Instructions (Signed)
[name],  It was a pleasure seeing you in the clinic today.   Diabetes: I have changed your metformin to 1 tablets twice a day. Please increase your insulin (Semglee) to 30 units every night. Lastly, I have increased your Trulicity to 1.'5mg'$  every week. Please pick up the new metformin and trulicity from your pharmacy. We are checking your iron levels today. I will call you with the results once I have them. Please come back in 1 month for your next visit.  Please call our clinic at [phone] if you have any questions or concerns. The best time to call is Monday-Friday from 9am-4pm, but there is someone available 24/7 at the same number. If you need medication refills, please notify your pharmacy one week in advance and they will send [place] a request.   Thank you for letting us take part in your care. We look forward to seeing you next time!

## 2022-06-27 NOTE — Assessment & Plan Note (Addendum)
Patient with T2DM, currently taking trulicity 0.'75mg'$  weekly, metformin '500mg'$  BID, semglee 25u qhs. Her last A1c was 9.6% earlier this month. She was started on semglee and increased metformin dose at last visit. CGM is showing somewhat better control but sugars are still remaining high about 55% of the time. No lows. We will increase her trulicity to 1.'5mg'$  weekly and increase her semglee to 30u qhs. She is experiencing GI side effects from metformin so will change her to metformin XR to help minimize these.   Plan: -increase trulicity to 1.'5mg'$  weekly -increase semglee to 30u qhs -changed to metformin XR '500mg'$  BID -f/u in 1 month -next A1c check in 3 months  ADDENDUM: Increased trulicity pen size to reflect increased dose and have sent this to her pharmacy. Patient informed and understands.

## 2022-06-27 NOTE — Assessment & Plan Note (Addendum)
Still having similar symptoms as last visit. Iron panel labs hemolyzed so will redraw today. Goal iron stores >75. Continue gabapentin at this time.   ADDENDUM: Iron/TIBC/Ferritin/ %Sat    Component Value Date/Time   IRON 49 06/27/2022 1040   TIBC 472 (H) 06/27/2022 1040   FERRITIN 35 06/27/2022 1040   IRONPCTSAT 10 (L) 06/27/2022 1040   Patient's iron panel reflecting iron deficiency, likely exacerbating her symptoms from RLS. We discussed her results and plan for iron supplementation. She previously took ferrous sulfate every day and this caused constipation for which she took dulcolax. We discussed taking ferrous sulfate '325mg'$  every other day instead of daily (polysaccharide formulation is not covered by her insurance). I advised patient to pick up miralax OTC and have prescribed senna-s qhs prn to help aim for at least 1 BM a day. She confirms understanding this information and will begin supplementation. Please obtain repeat iron studies in 6-8 weeks to check for improvement.  Plan: -ferrous sulfate '325mg'$  every other day -miralax OTC -senna-s qhs prn -repeat iron studies in 6-8 weeks

## 2022-06-27 NOTE — Telephone Encounter (Signed)
Received fax from [facility]. They received rx for Trulicity 0.'75mg'$ /0.53m Inject 1.'5mg'$ . They wants to know do you want 1.5 mg? Verify strength? Do you want 1.5 mg/0.5 ml pen?

## 2022-06-27 NOTE — Progress Notes (Signed)
CC: f/u T2DM, RLS  HPI:  [name] is a 61 y.o. female with history listed below presenting to the [facility] for f/u T2DM, RLS. Please see individualized problem based charting for full HPI.  Past Medical History:  Diagnosis Date   Anemia    Anxiety    Aortic valve stenosis, severe    Arthritis    PAIN AND SEVERE OA LEFT KNEE ; S/P RIGHT TKA ON 02/03/12; HAS LOWER BACK PAIN-UNABLE TO STAND MORE THAN 10 MIN; ARTHRITIS "ALL OVER"   Asthma    Blood transfusion    [facility]   Breast cancer in female [facility])    Right   CAD (coronary artery disease)    Cath 2010 with DES x 1 RCA-- PT'S CARDIOLOGIST IS DR. MCALHANY   Chronic diastolic congestive heart failure (HCC)    COPD (chronic obstructive pulmonary disease) (HCC)    Depression    Diabetes mellitus DIAGNOSED IN2010   Dyspnea    with much ambulation   Eczema    on back   Headache    migraines younger- rare now 02/07/21   Heart murmur    no current problems   History of hiatal hernia    History of kidney stones    passed or blasted   Hyperlipidemia    Hypertension    Morbid obesity with body mass index of 60.0-69.9 in adult [facility])    Myocardial infarction ([place])    PT THINKS SHE WAS DX WITH MI AT THE TIME OF HEART STENTING   Neuromuscular disorder (HCC)    bilateral arm/hands   Oxygen dependent    uses 3L oxygen night/prn   Personal history of radiation therapy    Pneumonia    Pulmonary embolism ([place]) 02/08/2012   S/P RT TOTAL KNEE ON 02/03/12--ON 02/08/12--DEVELOPED ACUTE SOB AND CHEST PAIN--AND DIAGNOSED WITH  PULMONARY EMBOLUS AND PNEUMONIA   Restless leg syndrome    Sleep apnea    uses 3 liters O2 at night as needed   Uterine fibroid    NO PROBLEMS AT PRESENT FROM THE FIBROIDS-STATES SHE IS POST MENOPAUSAL-LAST MENSES 2010 EXCEPT FOR EPISODE THIS YR OF BLEEDING RELATED TO FIBROIDS.   Weakness    BOTH HANDS - S/P BILATERAL CARPAL TUNNEL RELEASE--BUT STILL HAS WEAKNESS--OFTEN DROPS THINGS    Review of Systems:   Negative aside from that listed in individualized problem based charting.  Physical Exam:  Vitals:   06/27/22 0927  BP: 132/62  Pulse: 76  Temp: 99.1 F (37.3 C)  TempSrc: Oral  SpO2: 95%  Weight: (!) 311 lb 3.2 oz (141.2 kg)   Physical Exam Constitutional:      Appearance: Normal appearance. She is obese. She is not ill-appearing.  HENT:     Mouth/Throat:     Mouth: Mucous membranes are moist.     Pharynx: Oropharynx is clear.  Eyes:     Extraocular Movements: Extraocular movements intact.     Conjunctiva/sclera: Conjunctivae normal.  Cardiovascular:     Rate and Rhythm: Normal rate and regular rhythm.     Pulses: Normal pulses.     Heart sounds: Normal heart sounds. No murmur heard.    No gallop.  Pulmonary:     Effort: Pulmonary effort is normal.     Breath sounds: Normal breath sounds. No wheezing, rhonchi or rales.  Abdominal:     General: Bowel sounds are normal. There is no distension.     Palpations: Abdomen is soft.     Tenderness:  There is no abdominal tenderness.  Skin:    General: Skin is warm and dry.  Neurological:     General: No focal deficit present.     Mental Status: She is alert and oriented to person, place, and time.  Psychiatric:        Mood and Affect: Mood normal.        Behavior: Behavior normal.      Assessment & Plan:   See Encounters Tab for problem based charting.  Patient discussed with Dr. Heber [place]

## 2022-06-28 DIAGNOSIS — Z419 Encounter for procedure for purposes other than remedying health state, unspecified: Secondary | ICD-10-CM | POA: Diagnosis not present

## 2022-06-29 LAB — IRON,TIBC AND FERRITIN PANEL
Ferritin: 35 ng/mL (ref 15–150)
Iron Saturation: 10 % — ABNORMAL LOW (ref 15–55)
Iron: 49 ug/dL (ref 27–139)
Total Iron Binding Capacity: 472 ug/dL — ABNORMAL HIGH (ref 250–450)
UIBC: 423 ug/dL — ABNORMAL HIGH (ref 118–369)

## 2022-07-01 ENCOUNTER — Ambulatory Visit
Admission: EM | Admit: 2022-07-01 | Discharge: 2022-07-01 | Disposition: A | Discharge status: Home / Self Care | Payer: Medicaid Other | Attending: Student | Admitting: Student

## 2022-07-01 ENCOUNTER — Encounter: Payer: Self-pay | Admitting: Emergency Medicine

## 2022-07-01 ENCOUNTER — Ambulatory Visit (HOSPITAL_BASED_OUTPATIENT_CLINIC_OR_DEPARTMENT_OTHER)
Admission: RE | Admit: 2022-07-01 | Discharge: 2022-07-01 | Disposition: A | Discharge status: Home / Self Care | Payer: Medicaid Other | Source: Ambulatory Visit | Attending: Student | Admitting: Student

## 2022-07-01 ENCOUNTER — Other Ambulatory Visit: Payer: Self-pay

## 2022-07-01 ENCOUNTER — Other Ambulatory Visit: Payer: Self-pay | Admitting: Internal Medicine

## 2022-07-01 ENCOUNTER — Ambulatory Visit: Payer: Medicaid Other

## 2022-07-01 DIAGNOSIS — S46912A Strain of unspecified muscle, fascia and tendon at shoulder and upper arm level, left arm, initial encounter: Secondary | ICD-10-CM

## 2022-07-01 DIAGNOSIS — Z043 Encounter for examination and observation following other accident: Secondary | ICD-10-CM | POA: Diagnosis not present

## 2022-07-01 DIAGNOSIS — Z96612 Presence of left artificial shoulder joint: Secondary | ICD-10-CM | POA: Insufficient documentation

## 2022-07-01 DIAGNOSIS — E1169 Type 2 diabetes mellitus with other specified complication: Secondary | ICD-10-CM

## 2022-07-01 MED ORDER — TRAMADOL HCL 50 MG PO TABS: 50.0000 mg | ORAL_TABLET | Freq: Four times a day (QID) | ORAL | 0 refills | Status: DC | PRN

## 2022-07-01 NOTE — Discharge Instructions (Addendum)
-  Tramadol for pain, up to every 6 hours. This medication can cause drowsiness, so don't take before driving. Don't drink alcohol while on this medication. -You can take Tylenol up to 1000 mg 3 times daily -Follow-up with your orthopedist if symptoms persist  -Head over to [facility] for your xray. We'll call with abnormal results.

## 2022-07-01 NOTE — ED Provider Notes (Addendum)
[facility]    CSN: [id number] Arrival date & time: 07/01/22  0840      History   Chief Complaint Chief Complaint  Patient presents with   Shoulder Pain    HPI [name] is a 61 y.o. female presenting with L shoulder pain following fall that occurred 3 days ago. History reverse total shoulder arthroplasty.  She states that she tripped over a low step 1 day ago, and fell, hitting her left shoulder on the wall in the process.  Since then, pain is diffuse, worse with movement.  Has been taking tramadol that she has at home with some relief.  Denies sensation changes.  Denies left-sided chest pain at rest, denies shortness of breath, denies dizziness.  Denies head trauma, LOC, vision changes.  HPI  Past Medical History:  Diagnosis Date   Anemia    Anxiety    Aortic valve stenosis, severe    Arthritis    PAIN AND SEVERE OA LEFT KNEE ; S/P RIGHT TKA ON 02/03/12; HAS LOWER BACK PAIN-UNABLE TO STAND MORE THAN 10 MIN; ARTHRITIS "ALL OVER"   Asthma    Blood transfusion    2013[facility]   Breast cancer in female [facility])    Right   CAD (coronary artery disease)    Cath 2010 with DES x 1 RCA-- PT'S CARDIOLOGIST IS DR. MCALHANY   Chronic diastolic congestive heart failure (HCC)    COPD (chronic obstructive pulmonary disease) (HCC)    Depression    Diabetes mellitus DIAGNOSED IN2010   Dyspnea    with much ambulation   Eczema    on back   Headache    migraines younger- rare now 02/07/21   Heart murmur    no current problems   History of hiatal hernia    History of kidney stones    passed or blasted   Hyperlipidemia    Hypertension    Morbid obesity with body mass index of 60.0-69.9 in adult [facility])    Myocardial infarction ([place])    PT THINKS SHE WAS DX WITH MI AT THE TIME OF HEART STENTING   Neuromuscular disorder (HCC)    bilateral arm/hands   Oxygen dependent    uses 3L oxygen night/prn   Personal history of radiation therapy    Pneumonia    Pulmonary  embolism ([place]) 02/08/2012   S/P RT TOTAL KNEE ON 02/03/12--ON 02/08/12--DEVELOPED ACUTE SOB AND CHEST PAIN--AND DIAGNOSED WITH  PULMONARY EMBOLUS AND PNEUMONIA   Restless leg syndrome    Sleep apnea    uses 3 liters O2 at night as needed   Uterine fibroid    NO PROBLEMS AT PRESENT FROM THE FIBROIDS-STATES SHE IS POST MENOPAUSAL-LAST MENSES 2010 EXCEPT FOR EPISODE THIS YR OF BLEEDING RELATED TO FIBROIDS.   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   Healthcare maintenance 11/17/2021   Stress reaction 11/17/2021   Spondylolisthesis, lumbar region 02/11/2021    Class: Acute   Malignant neoplasm of upper-outer quadrant of right breast in female, estrogen receptor positive ([place]) 06/10/2018  Restless leg syndrome 02/04/2018   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   Depression 08/06/2015   Hypertension associated with diabetes ([place])    Aortic stenosis    Chronic diastolic congestive heart failure ([place])    COPD (chronic obstructive pulmonary disease) ([place]) 09/21/2013   Obstructive sleep apnea 09/21/2013   Nocturnal hypoxemia 09/12/2013   Hyperlipidemia 11/30/2012   History of pulmonary embolus ([place]) 07/09/2012   Normocytic anemia 02/08/2012   Type 2 diabetes mellitus without complication, without long-term current use of insulin ([place]) 02/08/2012   Asthma 02/08/2012   GERD (gastroesophageal reflux disease)    CAD (coronary artery disease) 12/08/2011    Past Surgical History:  Procedure Laterality Date   BACK  SURGERY  02/11/2021   Dr Louanne Skye   BREAST BIOPSY Right 06/04/2018   BREAST LUMPECTOMY Right 06/2018   BREAST LUMPECTOMY WITH RADIOACTIVE SEED AND SENTINEL LYMPH NODE BIOPSY Right 07/19/2018   Procedure: BREAST LUMPECTOMY WITH RADIOACTIVE SEED AND SENTINEL LYMPH NODE BIOPSY;  Surgeon: Alphonsa Overall, MD;  Location: [place];  Service: General;  Laterality: Right;   CARDIAC CATHETERIZATION     CARDIAC VALVE REPLACEMENT     2017   CARPAL TUNNEL RELEASE     Bilateral   CHOLECYSTECTOMY     COLONOSCOPY     CORONARY ANGIOPLASTY     2010 has stent in place   CYSTOSCOPY W/ RETROGRADES Right 09/21/2013   Procedure: CYSTOSCOPY WITH RIGHT RETROGRADE PYELOGRAM RIGHT DOUBLE J STENT ;  Surgeon: Fredricka Bonine, MD;  Location[facility];  Service: Urology;  Laterality: Right;   CYSTOSCOPY WITH URETEROSCOPY AND STENT PLACEMENT Right 10/25/2013   Procedure: CYSTOSCOPY RIGHT URETEROSCOPY HOLMIUM LASER LITHO AND STENT PLACEMENT;  Surgeon: Fredricka Bonine, MD;  Location: [facility];  Service: Urology;  Laterality: Right;   HERNIA REPAIR     INTRAOPERATIVE TRANSESOPHAGEAL ECHOCARDIOGRAM N/A 12/12/2014   Procedure: INTRAOPERATIVE TRANSESOPHAGEAL ECHOCARDIOGRAM;  Surgeon: Burnell Blanks, MD;  Location: [place];  Service: Cardiovascular;  Laterality: N/A;   JOINT REPLACEMENT     bil total knees   KNEE ARTHROPLASTY  02/03/2012   Procedure: COMPUTER ASSISTED TOTAL KNEE ARTHROPLASTY;  Surgeon: Mcarthur Rossetti, MD;  Location: [place];  Service: Orthopedics;  Laterality: Right;  Right total knee arthroplasty   LEFT AND RIGHT HEART CATHETERIZATION WITH CORONARY ANGIOGRAM N/A 03/17/2013   Procedure: LEFT AND RIGHT HEART CATHETERIZATION WITH CORONARY ANGIOGRAM;  Surgeon: Burnell Blanks, MD;  Location[facility] C[facility] LAB;  Service: Cardiovascular;  Laterality: N/A;   LEFT AND RIGHT HEART CATHETERIZATION WITH CORONARY/GRAFT ANGIOGRAM N/A 09/14/2014   Procedure: LEFT AND RIGHT HEART CATHETERIZATION WITH  Beatrix Fetters;  Surgeon: Burnell Blanks, MD;  Location: [facility] CATH LAB;  Service: Cardiovascular;  Laterality: N/A;   REVERSE SHOULDER ARTHROPLASTY Left 03/06/2022   Procedure: LEFT SHOULDER REPLACEMENT APPLICATION OF WOUND VAC;  Surgeon: [name], MD;  Location: [place];  Service: Orthopedics;  Laterality: Left;   TEE WITHOUT CARDIOVERSION N/A 03/14/2013   Procedure: TRANSESOPHAGEAL ECHOCARDIOGRAM (TEE);  Surgeon: Lelon Perla, MD;  Location[facility] ENDOSCOPY;  Service: Cardiovascular;  Laterality: N/A;   TEE WITHOUT CARDIOVERSION N/A 11/14/2014   Procedure: TRANSESOPHAGEAL ECHOCARDIOGRAM (TEE);  Surgeon: Lelon Perla, MD;  Location: [facility] ENDOSCOPY;  Service: Cardiovascular;  Laterality: N/A;   TONSILLECTOMY     maybe as a child- does not know   TOTAL KNEE ARTHROPLASTY  09/10/2012   Procedure: TOTAL KNEE ARTHROPLASTY;  Surgeon: Mcarth[place] Rossetti, MD;  [facility];  Service: Orthopedics;  Laterality: Left;   TOTAL KNEE REVISION Right 07/15/2013   Procedure: REVISION ARTHROPLASTY RIGHT KNEE;  Surgeon: Mcarthur Rossetti, MD;  [facility];  Service: Orthopedics;  Laterality: Right;   TRANSCATHETER AORTIC VALVE REPLACEMENT, TRANSFEMORAL N/A 12/12/2014   Procedure: TRANSCATHETER AORTIC VALVE REPLACEMENT, TRANSFEMORAL;  Surgeon: Burnell Blanks, MD;  Location: [place];  Service: Cardiovascular;  Laterality: N/A;   TRIGGER FINGER RELEASE  09/10/2012   Procedure: RELEASE TRIGGER FINGER/A-1 PULLEY;  Surgeon: Mcarthur Rossetti, MD;  [facility];  Service: Orthopedics;  Laterality: Right;  Right Ring Finger   TUBAL LIGATION      OB History     Gravida  3   Para      Term      Preterm      AB  1   Living         SAB  1   IAB      Ectopic      Multiple      Live Births  2        Obstetric Comments  SVD x 2. SAB x 1.           Home Medications    Prior to Admission medications   Medication Sig Start Date End Date  Taking? Authorizing Provider  traMADol (ULTRAM) 50 MG tablet Take 1 tablet (50 mg total) by mouth every 6 (six) hours as needed. 07/01/22  Yes Hazel Sams, PA-C  Accu-Chek FastClix Lancets MISC Check blood sugar two times a day 03/17/19   Lorella Nimrod, MD  albuterol (PROVENTIL) (2.5 MG/3ML) 0.083% nebulizer solution USE 1 VIAL IN NEBULIZER EVERY 4 HOURS AS NEEDED FOR WHEEZING FOR SHORTNESS OF BREATH 04/22/22   [name], MD  amlodipine-atorvastatin (CADUET) 5-80 MG tablet Take 1 tablet by mouth daily. 11/17/21 11/17/22  [name], MD  Ascorbic Acid (VITAMIN C) 1000 MG tablet Take 1,000 mg by mouth daily.    [provider]  ASPIRIN ADULT LOW STRENGTH 81 MG EC tablet TAKE 1 Tablet BY MOUTH ONCE DAILY 10/11/18   Lorella Nimrod, MD  Blood Glucose Monitoring Suppl (ACCU-CHEK GUIDE) w/Device KIT 1 each by Does not apply route 2 (two) times daily. 03/17/19   Lorella Nimrod, MD  busPIRone (BUSPAR) 10 MG tablet Take 1 tablet (10 mg total) by mouth 3 (three) times daily. 06/04/22 07/04/22  Idamae Schuller, MD  citalopram (CELEXA) 20 MG tablet Take 1 tablet (20 mg total) by mouth daily. Don't take while taking duloxetine. 06/04/22 07/04/22  Idamae Schuller, MD  clopidogrel (PLAVIX) 75 MG tablet TAKE 1 TABLET BY MOUTH IN THE MORNING 01/21/22   [name], MD  Continuous Blood Gluc Sensor (FREESTYLE LIBRE 3 SENSOR) MISC Place 1 sensor on the skin every 14 days. Use to check glucose continuously 06/26/22   [name], MD  docusate sodium (COLACE) 100 MG capsule Take 1 capsule (100 mg total) by mouth 2 (two) times daily. Patient not taking: Reported on 04/14/2022 02/13/21   [name], MD  Dulaglutide (TRULICITY) 1.44 YJ/8.5UD SOPN Inject 1.5 mg into the skin once a week. 06/27/22 08/08/22  [name], MD  Evolocumab (REPATHA SURECLICK) 149 MG/ML SOAJ INJECT 1 PEN INTO THE SKIN EVERY 14 DAYS 04/25/22   Burnell Blanks, MD  exemestane (AROMASIN) 25 MG tablet Take 1 tablet (25 mg  total) by mouth daily after breakfast. 02/03/22   [name], MD  ezetimibe (ZETIA) 10 MG tablet Take 1 tablet (10 mg total) by mouth  daily. 02/25/22 05/26/22  [name], PA-C  fexofenadine (ALLEGRA) 180 MG tablet Take 1 tablet (180 mg total) by mouth daily. 05/10/22 11/06/22  [name], PA-C  fluticasone (FLONASE) 50 MCG/ACT nasal spray Place 1 spray into both nostrils daily. Begin by using 2 sprays in each nare daily for 3 to 5 days, then decrease to 1 spray in each nare daily. 05/10/22   [name], PA-C  Fluticasone-Umeclidin-Vilant (TRELEGY ELLIPTA) 100-62.5-25 MCG/ACT AEPB INHALE 1 PUFF ONCE DAILY Strength: 100-62.5-25 MCG/ACT 02/10/22   Lacinda Axon, MD  furosemide (LASIX) 40 MG tablet Take 1 tablet (40 mg total) by mouth as needed for fluid or edema. 10/23/20   [name], MD  gabapentin (NEURONTIN) 300 MG capsule TAKE 3 CAPSULES BY MOUTH AT BEDTIME AND TAKE 1 CAPSULE IN THE MORNING 06/30/22   [name], MD  glucose blood (ACCU-CHEK GUIDE) test strip Check blood sugar 2 times per day 08/21/21   [name], MD  insulin glargine-yfgn (SEMGLEE) 100 UNIT/ML Pen Inject 30 Units into the skin at bedtime. 06/27/22   [name], MD  Insulin Pen Needle 30G X 5 MM MISC 1 application by Does not apply route daily. 08/21/21   [name], MD  losartan-hydrochlorothiazide (HYZAAR) 100-25 MG tablet Take 1 tablet by mouth daily. 11/17/21 11/17/22  [name], MD  metFORMIN (GLUCOPHAGE-XR) 500 MG 24 hr tablet Take 1 tablet (500 mg total) by mouth 2 (two) times daily. 06/27/22   [name], MD  methocarbamol (ROBAXIN) 500 MG tablet Take 1 tablet (500 mg total) by mouth every 6 (six) hours as needed for muscle spasms. 03/07/22   [name], MD  metoprolol succinate (TOPROL XL) 50 MG 24 hr tablet Take 1 tablet (50 mg total) by mouth daily. Take with or immediately following a meal. 11/17/21 11/17/22  [name], MD  montelukast  (SINGULAIR) 10 MG tablet Take 1 tablet (10 mg total) by mouth at bedtime. 05/10/22 08/08/22  [name], PA-C  Potassium 99 MG TABS Take 99 mg by mouth daily.    [provider]  VENTOLIN HFA 108 (90 Base) MCG/ACT inhaler INHALE 2 PUFFS BY MOUTH EVERY 6 HOURS AS NEEDED FOR WHEEZING OR SHORTNESS OF BREATH 06/30/22   [name], MD  Fluticasone-Salmeterol (ADVAIR DISKUS) 100-50 MCG/DOSE AEPB Inhale 1 puff into the lungs daily. 02/20/20 05/09/20  Earlene Plater, MD    Family History Family History  Problem Relation Age of Onset   Breast cancer Mother        stage IV at diagnosis   Emphysema Mother        smoked   Heart disease Mother    COPD Father        smoked   Asthma Father    Heart disease Father    Cancer Brother        Sinus    Social History Social History   Tobacco Use   Smoking status: Former    Packs/day: 1.50    Years: 30.00    Total pack years: 45.00    Types: Cigarettes    Quit date: 12/29/2000    Years since quitting: 21.5   Smokeless tobacco: Never  Vaping Use   Vaping Use: Never used  Substance Use Topics   Alcohol use: Not Currently   Drug use: Not Currently    Types: Marijuana    Comment: CBD oils through vape shops     Allergies   Patient has no known allergies.  Review of Systems Review of Systems  Musculoskeletal:        L shoulder pain   All other systems reviewed and are negative.    Physical Exam Triage Vital Signs ED Triage Vitals [07/01/22 0853]  Enc Vitals Group     BP 137/75     Pulse Rate 85     Resp 18     Temp 98.7 F (37.1 C)     Temp Source Oral     SpO2 94 %     Weight      Height      Head Circumference      Peak Flow      Pain Score 8     Pain Loc      Pain Edu?      Excl. in [place]?    No data found.  Updated Vital Signs BP 137/75 (BP Location: Left Arm)   Pulse 85   Temp 98.7 F (37.1 C) (Oral)   Resp 18   LMP 02/03/2012   SpO2 94%   Visual Acuity Right Eye Distance:   Left  Eye Distance:   Bilateral Distance:    Right Eye Near:   Left Eye Near:    Bilateral Near:     Physical Exam Vitals reviewed.  Constitutional:      General: She is not in acute distress.    Appearance: Normal appearance. She is not ill-appearing.  HENT:     Head: Normocephalic and atraumatic.  Pulmonary:     Effort: Pulmonary effort is normal.  Musculoskeletal:     Comments: L shoulder - diffusely TTP, without point tenderness or bony tenderness. No skin changes or swelling. Pain elicited with abduction L arm. She is able to abduct the arm to 90 degrees. Crossbody adduction is also intact. No elbow or wrist pain. Grip strength 5/5, cap refill <2 seconds, radial pulse 2+.   Neurological:     General: No focal deficit present.     Mental Status: She is alert and oriented to person, place, and time.  Psychiatric:        Mood and Affect: Mood normal.        Behavior: Behavior normal.        Thought Content: Thought content normal.        Judgment: Judgment normal.      UC Treatments / Results  Labs (all labs ordered are listed, but only abnormal results are displayed) Labs Reviewed - No data to display  EKG   Radiology No results found.  Procedures Procedures (including critical care time)  Medications Ordered in UC Medications - No data to display  Initial Impression / Assessment and Plan / UC Course  I have reviewed the triage vital signs and the nursing notes.  Pertinent labs & imaging results that were available during my care of the patient were reviewed by me and considered in my medical decision making (see chart for details).     This patient is a very pleasant 61 y.o. year old female presenting with L shoulder pain following fall that occurred 3 days ago. Neurovascularly intact.   She has a history of total shoulder arthroplasty. Reassuring exam today. Xray ordered outpatient- Postsurgical changes reflecting reverse shoulder arthroplasty. No evidence of  acute injury. Short course of tramadol sent as she cannot take nsaids. Pdmp reviewed. F/u with ortho, she already has one.   She does have a history of CAD, but today's pain is reproducible in the L shoulder,  and not associated with CP, dizziness, SOB. Low concern for ACS.   ED return precautions discussed. Patient verbalizes understanding and agreement.     Final Clinical Impressions(s) / UC Diagnoses   Final diagnoses:  Left shoulder strain, initial encounter  History of left shoulder replacement     Discharge Instructions      -Tramadol for pain, up to every 6 hours. This medication can cause drowsiness, so don't take before driving. Don't drink alcohol while on this medication. -You can take Tylenol up to 1000 mg 3 times daily -Follow-up with your orthopedist if symptoms persist  -Head over to [facility] for your xray. We'll call with abnormal results.      ED Prescriptions     Medication Sig Dispense Auth. Provider   traMADol (ULTRAM) 50 MG tablet Take 1 tablet (50 mg total) by mouth every 6 (six) hours as needed. 15 tablet Hazel Sams, PA-C      I have reviewed the PDMP during this encounter.   Hazel Sams, PA-C 07/01/22 1026    Hazel Sams, PA-C 07/01/22 1246

## 2022-07-01 NOTE — ED Triage Notes (Signed)
Pt sts left shoulder pain after fall several days ago that had improved until she was drying her hair yesterday and heard pop in shoulder with increased pain

## 2022-07-02 NOTE — Telephone Encounter (Signed)
Patient calls to find out if [facility] sensors were approved. She was notified the prescription was sent to her pharmacy.  She called back saying the [place] told her the PA for her CGM was sent to our office.

## 2022-07-02 NOTE — Telephone Encounter (Signed)
Received another fax from [facility] to clarify. Do you want Trulicity 1.'5mg'$  pen with the instruction to inject 1.5 mg wekkly - if so, need new rx?

## 2022-07-03 ENCOUNTER — Telehealth: Payer: Self-pay

## 2022-07-03 NOTE — Telephone Encounter (Signed)
Pa for pt ( FREESTYLE  Libre 3 sensor )  came through on cover my meds , was submitted with last office notes and labs ... Awaiting approval or denial

## 2022-07-03 NOTE — Telephone Encounter (Signed)
DECISION :   Outcome  Approved today   Approved.    This drug has been approved. Approved quantity: 2 each per 28 day(s). You may fill up to a 34 day supply at a retail pharmacy.   You may fill up to a 90 day supply for maintenance drugs, please refer to the formulary for details. Please call the pharmacy to process your prescription claim.   Drug FreeStyle Libre 3 Sensor   Form [facility] Medicaid of [employer] Electronic Prior Authorization Request Form [phone] NCPDP)      ( COPY SENT TO PHARMACY ALSO )

## 2022-07-05 ENCOUNTER — Other Ambulatory Visit: Payer: Self-pay | Admitting: Internal Medicine

## 2022-07-05 DIAGNOSIS — F3342 Major depressive disorder, recurrent, in full remission: Secondary | ICD-10-CM

## 2022-07-07 ENCOUNTER — Telehealth: Payer: Self-pay

## 2022-07-07 NOTE — Progress Notes (Signed)
Internal Medicine Clinic Attending  Case discussed with the resident at the time of the visit.  We reviewed the resident's history and exam and pertinent patient test results.  I agree with the assessment, diagnosis, and plan of care documented in the resident's note.  

## 2022-07-07 NOTE — Telephone Encounter (Signed)
Questions about Dulaglutide (TRULICITY) 1.48 DG/7.3HQ SOPN, please call pt back.

## 2022-07-07 NOTE — Telephone Encounter (Signed)
Returned call to Patient. States new dose of Trulicity (1.5 mg) is requiring a PA. Will forward to PA staff.

## 2022-07-08 DIAGNOSIS — H524 Presbyopia: Secondary | ICD-10-CM | POA: Diagnosis not present

## 2022-07-08 MED ORDER — TRULICITY 1.5 MG/0.5ML ~~LOC~~ SOAJ: 1.5000 mg | SUBCUTANEOUS | 1 refills | Status: DC

## 2022-07-08 MED ORDER — FERROUS SULFATE 325 (65 FE) MG PO TABS: 325.0000 mg | ORAL_TABLET | ORAL | 3 refills | Status: DC

## 2022-07-08 MED ORDER — SENNOSIDES-DOCUSATE SODIUM 8.6-50 MG PO TABS: 1.0000 | ORAL_TABLET | Freq: Every evening | ORAL | 1 refills | Status: DC | PRN

## 2022-07-08 NOTE — Addendum Note (Signed)
Addended by: [name] on: 07/08/2022 02:57 PM   Modules accepted: Orders

## 2022-07-08 NOTE — Addendum Note (Signed)
Addended by: [name] on: 07/08/2022 11:50 AM   Modules accepted: Orders

## 2022-07-16 ENCOUNTER — Encounter (HOSPITAL_COMMUNITY): Payer: Self-pay

## 2022-07-16 ENCOUNTER — Ambulatory Visit (HOSPITAL_COMMUNITY): Payer: Medicaid Other | Admitting: Physician Assistant

## 2022-07-17 ENCOUNTER — Other Ambulatory Visit: Payer: Self-pay

## 2022-07-18 NOTE — Patient Outreach (Signed)
Medicaid Managed Care Social Work Note  07/17/2022 Name:  [name] MRN:  [id number] DOB:  August 10, 1961  [name] is an 61 y.o. year old female who is a primary patient of [name], MD.  The [place] team was consulted for assistance with:  [place] and Resources  Ms. Yaklin was given information about Medicaid Managed Care Coordination team services today. [name] Patient agreed to services and verbal consent obtained.  Engaged with patient  for by telephone forfollow up visit in response to referral for case management and/or care coordination services.   Assessments/Interventions:  Review of past medical history, allergies, medications, health status, including review of consultants reports, laboratory and other test data, was performed as part of comprehensive evaluation and provision of chronic care management services.  SDOH: (Social Determinant of Health) assessments and interventions performed:   Advanced Directives Status:  See Care Plan for related entries.  Care Plan                 No Known Allergies  Medications Reviewed Today     Reviewed by Greg Cutter, LCSW (Social Worker) on 07/17/22 at 1653  Med List Status: <None>   Medication Order Taking? Sig Documenting Provider Last Dose Status Informant  Accu-Chek FastClix Lancets MISC [id number] No Check blood sugar two times a day Lorella Nimrod, MD Taking Active Self  albuterol (PROVENTIL) (2.5 MG/3ML) 0.083% nebulizer solution [id number]  USE 1 VIAL IN NEBULIZER EVERY 4 HOURS AS NEEDED FOR WHEEZING FOR SHORTNESS OF Loyal Gambler, MD  Active   amlodipine-atorvastatin (CADUET) 5-80 MG tablet [id number] No Take 1 tablet by mouth daily. [name], MD 03/06/2022 0730 Active Self  Ascorbic Acid (VITAMIN C) 1000 MG tablet [id number] No Take 1,000 mg by mouth daily. [provider] Past Month Active Self  ASPIRIN ADULT LOW  STRENGTH 81 MG EC tablet [id number] No TAKE 1 Tablet BY MOUTH ONCE DAILY Lorella Nimrod, MD 02/27/2022 Active Self  Blood Glucose Monitoring Suppl (ACCU-CHEK GUIDE) w/Device KIT [id number] No 1 each by Does not apply route 2 (two) times daily. Lorella Nimrod, MD Taking Active Self  citalopram (CELEXA) 20 MG tablet [id number]  TAKE 1 TABLET BY MOUTH ONCE DAILY. DO NOT TAKE WHILE TAKING DULOXETINE [name], MD  Active   clopidogrel (PLAVIX) 75 MG tablet [id number] No TAKE 1 TABLET BY MOUTH IN THE [name], MD 02/27/2022 Active Self  Continuous Blood Gluc Sensor (FREESTYLE LIBRE 3 SENSOR) MISC [id number]  Place 1 sensor on the skin every 14 days. Use to check glucose continuously [name], MD  Active   docusate sodium (COLACE) 100 MG capsule [id number] No Take 1 capsule (100 mg total) by mouth 2 (two) times daily.  Patient not taking: Reported on 04/14/2022   [name], MD Not Taking Active Self  Dulaglutide (TRULICITY) 1.5 TT/0.1XB SOPN [id number]  Inject 1.5 mg into the skin once a week. [name], MD  Active   Evolocumab [facility] SURECLICK) 330 MG/ML [name] [id number]  INJECT 1 PEN INTO THE SKIN EVERY 14 DAYS Burnell Blanks, MD  Active   exemestane (AROMASIN) 25 MG tablet [id number] No Take 1 tablet (25 mg total) by mouth daily after breakfast. [name], MD 03/06/2022 0730 Active Self  ezetimibe (ZETIA) 10 MG tablet [id number] No Take 1 tablet (10 mg total) by mouth daily. [name], PA-C 03/06/2022 0730 Expired 05/26/22 2359   ferrous sulfate 325 (65 FE) MG tablet [id number]  Take 1 tablet (325 mg total) by mouth every other day. [name], MD  Active   fexofenadine [facility]) 180 MG tablet [id number]  Take 1 tablet (180 mg total) by mouth daily. [name], PA-C  Active   fluticasone (FLONASE) 50 MCG/ACT nasal spray [id number]  Place 1 spray into both nostrils daily. Begin by using 2 sprays in each nare daily for 3 to 5 days, then decrease  to 1 spray in each nare daily. [name], PA-C  Active   Discontinued 05/09/20 1429   Fluticasone-Umeclidin-Vilant (TRELEGY ELLIPTA) 100-62.5-25 MCG/ACT AEPB [id number] No INHALE 1 PUFF ONCE DAILY Strength: 100-62.5-25 MCG/ACT Lacinda Axon, MD 03/06/2022 0730 Active Self  furosemide (LASIX) 40 MG tablet [id number] No Take 1 tablet (40 mg total) by mouth as needed for fluid or edema. [name], MD Past Week Active Self  gabapentin (NEURONTIN) 300 MG capsule [id number]  TAKE 3 CAPSULES BY MOUTH AT BEDTIME AND TAKE 1 CAPSULE IN THE MORNING [name], MD  Active   glucose blood (ACCU-CHEK GUIDE) test strip [id number] No Check blood sugar 2 times per day [name], MD Taking Active Self  insulin glargine-yfgn (SEMGLEE) 100 UNIT/ML Pen [id number]  Inject 30 Units into the skin at bedtime. [name], MD  Active   Insulin Pen Needle 30G X 5 MM MISC [id number] No 1 application by Does not apply route daily. [name], MD Taking Active Self  losartan-hydrochlorothiazide [facility]) 100-25 MG tablet [id number] No Take 1 tablet by mouth daily. [name], MD 03/05/2022 Active Self  metFORMIN (GLUCOPHAGE-XR) 500 MG 24 hr tablet [id number]  Take 1 tablet (500 mg total) by mouth 2 (two) times daily. [name], MD  Active   methocarbamol (ROBAXIN) 500 MG tablet [id number]  Take 1 tablet (500 mg total) by mouth every 6 (six) hours as needed for muscle spasms. [name], MD  Active   metoprolol succinate (TOPROL XL) 50 MG 24 hr tablet [id number] No Take 1 tablet (50 mg total) by mouth daily. Take with or immediately following a meal. [name], MD 03/06/2022 0730 Active Self  montelukast (SINGULAIR) 10 MG tablet [id number]  Take 1 tablet (10 mg total) by mouth at bedtime. [name], PA-C  Active   Potassium 99 MG TABS [id number] No Take 99 mg by mouth daily. [provider] Past Month Active Self  senna-docusate (SENNA-TIME S) 8.6-50 MG  tablet [id number]  Take 1 tablet by mouth at bedtime as needed for mild constipation. [name], MD  Active   traMADol Veatrice Bourbon) 50 MG tablet [id number]  Take 1 tablet (50 mg total) by mouth every 6 (six) hours as needed. Hazel Sams, PA-C  Active   VENTOLIN HFA 108 (90 Base) MCG/ACT inhaler [id number]  INHALE 2 PUFFS BY MOUTH EVERY 6 HOURS AS NEEDED FOR WHEEZING OR SHORTNESS OF Loyal Gambler, MD  Active             Patient Active Problem List   Diagnosis Date Noted   PTSD (post-traumatic stress disorder) 06/24/2022   GAD (generalized anxiety disorder) 06/24/2022   Major depressive disorder, recurrent episode, moderate ([place]) 06/24/2022   Urinary incontinence 06/20/2022   Dysuria 06/20/2022   Intertrigo 06/20/2022   Arthritis of left shoulder region    S/P reverse total shoulder arthroplasty, 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   Healthcare maintenance 11/17/2021   Stress reaction 11/17/2021   Spondylolisthesis, lumbar region 02/11/2021    Class: Acute   Malignant neoplasm of upper-outer quadrant of right breast  in female, estrogen receptor positive ([place]) 06/10/2018   Restless leg syndrome 02/04/2018   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   Depression 08/06/2015   Hypertension associated with diabetes [facility])    Aortic stenosis    Chronic diastolic congestive heart failure ([place])    COPD (chronic obstructive pulmonary disease) ([place]) 09/21/2013   Obstructive sleep apnea 09/21/2013   Nocturnal hypoxemia 09/12/2013   Hyperlipidemia 11/30/2012   History of pulmonary embolus (PE) 07/09/2012   Normocytic anemia 02/08/2012   Type 2 diabetes mellitus without complication, without long-term current use of insulin ([place]) 02/08/2012   Asthma 02/08/2012   GERD (gastroesophageal reflux disease)    CAD (coronary artery disease) 12/08/2011    Conditions to be addressed/monitored per PCP order:  Anxiety and Depression  Care Plan : LCSW Plan of Care  Updates made by Greg Cutter, LCSW since 07/18/2022 12:00 AM     Problem: Depression Identification (Depression)      Long-Range Goal: Depressive Symptoms Identified   Start Date: 04/17/2022  This Visit's Progress: On track  Note:   Long-Range Goal: I want to start mental health treatment    Start Date: 04/17/2022  Priority: High  Note:   Priority: High   Timeframe:  Long-Range Goal Priority:  High Start Date:   04/17/22                Expected End Date:  07/17/22                  Follow Up Date--07/17/22 at 1:00 pm.    - check out counseling - keep 90 percent of counseling appointments - schedule counseling appointment    Why is this important?             Beating depression may take some time.            If you don't feel better right away, don't give up on your treatment plan.     Current barriers:   Chronic Mental Health needs related to depression, stress and anxiety. Patient requires Support, Education, Resources, Referrals, Advocacy, and Care Coordination, in order to meet Unmet Mental Health Needs. Patient will implement clinical interventions discussed today to decrease symptoms of depression and increase knowledge and/or ability of: coping skills. Mental Health Concerns and Social Isolation Patient lacks knowledge of available community counseling agencies and resources.   Clinical Goal(s): verbalize understanding of plan for management of Anxiety, Depression, and Stress and demonstrate a reduction in symptoms. Patient will connect with a provider for ongoing mental health treatment, increase coping skills, healthy habits, self-management skills, and stress reduction        Clinical Interventions:  Assessed patient's previous and current treatment, coping skills, support system and barriers to care. Patient provided history. Patient and spouse lived in a shared home with two other adults. Patient reports that this is a current and stable living situation.  Verbalization of feelings encouraged,  motivational interviewing employed Emotional support provided, positive coping strategies explored Self care/establishing healthy boundaries emphasized Patient reports that she takes medication for anxiety and depression. She reports that this has alleviated some of symptoms but not all. She reports that she still has crying spells. She needs help with finding psychiatry and counseling. She was successful in identifying triggers to anxiety and depression symptoms, in addition, to healthy coping skills.  Patient reports significant worsening anxiety and depression impacting her ability to function appropriately and carry out daily task. Patient receives strong support from spouse but  he does not have anxiety or depression and is unable to relate to her and her daily mental health struggles.  Patient is agreeable to referral to [facility] for counseling and psychiatry. [facility] LCSW made referral on 04/17/22. Patient will call [facility] tomorrow on 04/18/22 to schedule counseling and psychiatry appointments. Email sent to her with [facility]'s contact information.  LCSW provided education on relaxation techniques such as meditation, deep breathing, massage, grounding exercises or yoga that can activate the body's relaxation response and ease symptoms of stress and anxiety. LCSW ask that when pt is struggling with difficult emotions and racing thoughts that they start this relaxation response process. LCSW provided extensive education on healthy coping skills for anxiety. SW used active and reflective listening, validated patient's feelings/concerns, and provided emotional support. Patient will work on implementing appropriate self-care habits into their daily routine such as: staying positive, writing a gratitude list, drinking water, staying active around the house, taking their medications as prescribed, combating negative thoughts or emotions and staying connected with their family and friends. Positive reinforcement provided  for this decision to work on this. Patient was receptive to anxiety and depression management coping skill education. She will try to increase her socialization over the next 90 days to decrease isolation.  Patient has problems with sleep disturbance. LCSW provided education on healthy sleep hygiene and what that looks like. LCSW encouraged patient to implement a night time routine into her schedule that works best for her and that she is able to maintain. Advised patient to implement deep breathing/grounding/meditation/self-care exercises into her nightly routine to combat racing thoughts at night. LCSW encouraged patient to wake up at the same time each day, make their sleeping environment comfortable, exercise when able, to limit naps and to not eat or drink anything right before bed.   Motivational Interviewing employed Depression screen reviewed  PHQ2/ PHQ9 completed Mindfulness or Relaxation training provided Active listening / Reflection utilized  Advance Care and HCPOA education provided Emotional Support Provided Problem [place] strategies reviewed Provided psychoeducation for mental health needs  Provided brief CBT  Reviewed mental health medications and discussed importance of compliance:  Quality of sleep assessed & Sleep Hygiene techniques promoted  Participation in counseling encouraged  Verbalization of feelings encouraged  Suicidal Ideation/Homicidal Ideation assessed: Patient denies SI/HI  Review resources, discussed options and provided patient information about  [place] care team collaboration (see longitudinal plan of care) UPDATE 05/21/22- Patient was successfully set up with counseling at [facility] but was not set up with psychiatry. She is in need of both services. Patient has an upcoming counseling session at [facility] on 06/24/22. [facility] LCSW completed joint call with patient to [facility] but was unable to reach anyone. [facility] LCSW left a  voice message asking for them to return call to patient to get her scheduled with psychiatry as [facility] LCSW ask for both psychiatry and counseling in referral. [facility] LCSW ask if they needed an additional referral to please let [facility] LCSW know so this can be completed. [facility] LCSW sent patient an email with [facility]'s contact information including their address as well as a list of coping skills for depression and anxiety. Patient's spouse has multiple health conditions and tells patient that he "wishes to die" often which concerns her. [facility] LCSW provided emotional support and brief coping skill education on stress management.  UPDATE 06/12/22- Patient successfully contacted [facility] and set up appointments for both psychiatry and counseling. Patient is eager to start treatment. Brief self-care education  provided to patient. Patient is agreeable to contact [facility] LCSW directly if needed. [facility] LCSW will make one last follow up on call to ensure that patient was successfully connected to a long term mental health provider. Patient denies any current crises or concerns.  UPDATE 07/18/22- Patient has not been set up with counseling at [facility] and she has completed her first counseling session but she has not been set up with psychiatry still. She has made several calls to agency but has been unsuccessful in reaching them. Patient has an in person appointment at [facility] on 07/30/22 and she will ask them to enroll her in psychiatry at that time. [facility] LCSW will follow up and continue to support patient as needed.  Patient Goals/Self-Care Activities: Over the next 120 days Attend scheduled medical appointments Utilize healthy coping skills and supportive resources discussed Contact PCP with any questions or concerns Keep 90 percent of counseling appointments Call your insurance provider for more information about your Enhanced Benefits  Check out counseling resources provided  Begin personal counseling with LCSW, to reduce and manage symptoms  of Depression and Stress, until well-established with mental health provider Accept all calls from representative with [facility] in an effort to establish ongoing mental health counseling and supportive services. Incorporate into daily practice - relaxation techniques, deep breathing exercises, and mindfulness meditation strategies. Talk about feelings with friends, family members, spiritual advisor, etc. Contact LCSW directly [phone]), if you have questions, need assistance, or if additional social work needs are identified between now and our next scheduled telephone outreach call. Call 988 for mental health hotline/crisis line if needed (24/7 available) Try techniques to reduce symptoms of anxiety/negative thinking (deep breathing, distraction, positive self talk, etc)  - develop a personal safety plan - develop a plan to deal with triggers like holidays, anniversaries - exercise at least 2 to 3 times per week - have a plan for how to handle bad days - journal feelings and what helps to feel better or worse - spend time or talk with others at least 2 to 3 times per week - watch for early signs of feeling worse - begin personal counseling - call and visit an old friend - check out volunteer opportunities - join a support group - laugh; watch a funny movie or comedian - learn and use visualization or guided imagery - perform a random act of kindness - practice relaxation or meditation daily - start or continue a personal journal - practice positive thinking and self-talk -continue with compliance of taking medication        05/21/2022   12:53 PM 04/17/2022    3:17 PM 11/14/2021    4:02 PM 08/21/2021    4:33 PM 12/10/2020    4:00 PM  Depression screen PHQ 2/9  Decreased Interest _0 0 1  Down, Depressed, Hopeless _1 0 0  PHQ - 2 Score _2 0 1  Altered sleeping _3 Tired, decreased energy _4 Change in appetite _5 Feeling bad or failure about  yourself  0 1 0   0  Trouble concentrating 0 0 0   0  Moving slowly or fidgety/restless 0   0   0  Suicidal thoughts 0 0 0   0  PHQ-9 Score _6 Difficult doing work/chores Somewhat difficult Somewhat difficult Not difficult at all   Not  difficult at all        24- Hour Availability:    [facility]  [address] [place], [place] Crisis [phone]   [facility] [employer] [phone]   [place]  [phone]    [place]  [phone] (after hours)   Therapeutic Alternative/Mobile Crisis   [phone]   [place] National Suicide Hotline  [phone] [place]am[place]ina Monks) [place] 988   Call 911 or go to emergency room   [facility]  [phone]);  [place] and [employer]  [phone]); [place], [place], [place], [place], [place], [place], [place]           Follow up:  Patient agrees to Care Plan and Follow-up.  Plan: The Managed Medicaid care management team will reach out to the patient again over the next 30 days.  Date/time of next scheduled Social Work care management/care coordination outreach:  08/01/22 at 10:30 am.  [name], BSW, MSW, LCSW Managed Medicaid LCSW [email or web address] Phone: [phone]

## 2022-07-18 NOTE — Patient Instructions (Signed)
Visit Information  Ms. Deringer was given information about Medicaid Managed Care team care coordination services as a part of their [facility] Medicaid benefit. [name] verbally consented to engagement with the [facility] Managed Care team.   If you are experiencing a medical emergency, please call 911 or report to your local emergency department or urgent care.   If you have a non-emergency medical problem during routine business hours, please contact your provider's office and ask to speak with a nurse.   For questions related to your [facility] health plan, please call: [phone] or go here:[email or web address]  If you would like to schedule transportation through your [facility] plan, please call the following number at least 2 days in advance of your appointment: [phone].  You can also use the MTM portal or MTM mobile app to manage your rides. For the portal, please go to [email or web address].  Call the Jackpot at [phone], at any time, 24 hours a day, 7 days a week. If you are in danger or need immediate medical attention call 911.  If you would like help to quit smoking, call 1-800-QUIT-NOW (501)211-0894) OR Espaol: 1-855-Djelo-Ya (3-818-299-3716) o para ms informacin haga clic aqu or Text READY to 200-400 to register via text  Following is a copy of your plan of care:  Care Plan : LCSW Plan of Care  Updates made by Greg Cutter, LCSW since 07/18/2022 12:00 AM     Problem: Depression Identification (Depression)      Long-Range Goal: Depressive Symptoms Identified   Start Date: 04/17/2022  This Visit's Progress: On track  Note:   Long-Range Goal: I want to start mental health treatment    Start Date: 04/17/2022  Priority: High  Note:   Priority: High   Timeframe:  Long-Range Goal Priority:  High Start Date:   04/17/22                Expected End Date:  07/17/22                  Follow Up Date--07/17/22 at  1:00 pm.    - check out counseling - keep 90 percent of counseling appointments - schedule counseling appointment    Why is this important?             Beating depression may take some time.            If you don't feel better right away, don't give up on your treatment plan.     Current barriers:   Chronic Mental Health needs related to depression, stress and anxiety. Patient requires Support, Education, Resources, Referrals, Advocacy, and Care Coordination, in order to meet Unmet Mental Health Needs. Patient will implement clinical interventions discussed today to decrease symptoms of depression and increase knowledge and/or ability of: coping skills. Mental Health Concerns and Social Isolation Patient lacks knowledge of available community counseling agencies and resources.   Clinical Goal(s): verbalize understanding of plan for management of Anxiety, Depression, and Stress and demonstrate a reduction in symptoms. Patient will connect with a provider for ongoing mental health treatment, increase coping skills, healthy habits, self-management skills, and stress reduction        Patient Goals/Self-Care Activities: Over the next 120 days Attend scheduled medical appointments Utilize healthy coping skills and supportive resources discussed Contact PCP with any questions or concerns Keep 90 percent of counseling appointments Call your insurance provider for more information about your Enhanced Benefits  Check out counseling resources provided  Begin personal counseling with LCSW, to reduce and manage symptoms of Depression and Stress, until well-established with mental health provider Accept all calls from representative with [facility] in an effort to establish ongoing mental health counseling and supportive services. Incorporate into daily practice - relaxation techniques, deep breathing exercises, and mindfulness meditation strategies. Talk about feelings with friends, family members,  spiritual advisor, etc. Contact LCSW directly [phone]), if you have questions, need assistance, or if additional social work needs are identified between now and our next scheduled telephone outreach call. Call [phone] for mental health hotline/crisis line if needed (24/7 available) Try techniques to reduce symptoms of anxiety/negative thinking (deep breathing, distraction, positive self talk, etc)  - develop a personal safety plan - develop a plan to deal with triggers like holidays, anniversaries - exercise at least 2 to 3 times per week - have a plan for how to handle bad days - journal feelings and what helps to feel better or worse - spend time or talk with others at least 2 to 3 times per week - watch for early signs of feeling worse - begin personal counseling - call and visit an old friend - check out volunteer opportunities - join a support group - laugh; watch a funny movie or comedian - learn and use visualization or guided imagery - perform a random act of kindness - practice relaxation or meditation daily - start or continue a personal journal - practice positive thinking and self-talk -continue with compliance of taking medication        05/21/2022   12:53 PM 04/17/2022    3:17 PM 11/14/2021    4:02 PM 08/21/2021    4:33 PM 12/10/2020    4:00 PM  Depression screen PHQ 2/9  Decreased Interest '2 2 1 '$ 0 1  Down, Depressed, Hopeless '2 2 1 '$ 0 0  PHQ - 2 Score '4 4 2 '$ 0 1  Altered sleeping '3 3 3   1  '$ Tired, decreased energy '3 3 1   1  '$ Change in appetite '3 3 1   1  '$ Feeling bad or failure about yourself  0 1 0   0  Trouble concentrating 0 0 0   0  Moving slowly or fidgety/restless 0   0   0  Suicidal thoughts 0 0 0   0  PHQ-9 Score '13 14 7   4  '$ Difficult doing work/chores Somewhat difficult Somewhat difficult Not difficult at all   Not difficult at all        24- Hour Availability:    [facility]  [address] [place], [place]  [place] [facility] [phone]   [facility] the [employer] [phone]   [place]  [phone]    [place]  [phone] (after hours)   Therapeutic Alternative/Mobile Crisis   [phone]   [place] National Suicide Hotline  (640)275-1268 (TALK) OR 988   Call 911 or go to emergency room   [facility]  [phone]);  [place] and [employer]  [phone]); [place], [place], [place], [place], [place], [place], [place], [place], MSW, [employer] Managed Medicaid [email or web address] [email or web address] Phone: [phone]

## 2022-07-20 ENCOUNTER — Other Ambulatory Visit: Payer: Self-pay | Admitting: Specialist

## 2022-07-20 DIAGNOSIS — E119 Type 2 diabetes mellitus without complications: Secondary | ICD-10-CM

## 2022-07-25 ENCOUNTER — Other Ambulatory Visit: Payer: Self-pay | Admitting: Obstetrics and Gynecology

## 2022-07-25 NOTE — Patient Outreach (Signed)
Medicaid Managed Care   Nurse Care Manager Note  07/25/2022 Name:  [name] MRN:  [id number] DOB:  September 03, 1961  [name] is an 61 y.o. year old female who is a primary patient of [name], MD.  The [facility] Managed Care Coordination team was consulted for assistance with:    Chronic healthcare management needs, HTN, DM, CHF, CAD, asthma, COPD, OSA, anxiety/depression, arthritis, GERD, RLS, HLD  [name] was given information about Medicaid Managed Care Coordination team services today. [name] Patient agreed to services and verbal consent obtained.  Engaged with patient by telephone for follow up visit in response to provider referral for case management and/or care coordination services.   Assessments/Interventions:  Review of past medical history, allergies, medications, health status, including review of consultants reports, laboratory and other test data, was performed as part of comprehensive evaluation and provision of chronic care management services.  SDOH (Social Determinants of Health) assessments and interventions performed:  Care Plan  No Known Allergies  Medications Reviewed Today     Reviewed by Gayla Medicus, RN (Registered Nurse) on 07/25/22 at [place] List Status: <None>   Medication Order Taking? Sig Documenting Provider Last Dose Status Informant  Accu-Chek FastClix Lancets MISC [id number] No Check blood sugar two times a day Lorella Nimrod, MD Taking Active Self  albuterol (PROVENTIL) (2.5 MG/3ML) 0.083% nebulizer solution [id number]  USE 1 VIAL IN NEBULIZER EVERY 4 HOURS AS NEEDED FOR WHEEZING FOR SHORTNESS OF Loyal Gambler, MD  Active   amlodipine-atorvastatin (CADUET) 5-80 MG tablet [id number] No Take 1 tablet by mouth daily. [name], MD 03/06/2022 0730 Active Self  Ascorbic Acid (VITAMIN C) 1000 MG tablet [id number] No Take 1,000 mg by mouth daily. [provider] Past Month Active Self   ASPIRIN ADULT LOW STRENGTH 81 MG EC tablet [id number] No TAKE 1 Tablet BY MOUTH ONCE DAILY Lorella Nimrod, MD 02/27/2022 Active Self  Blood Glucose Monitoring Suppl (ACCU-CHEK GUIDE) w/Device KIT [id number] No 1 each by Does not apply route 2 (two) times daily. Lorella Nimrod, MD Taking Active Self  citalopram (CELEXA) 20 MG tablet [id number]  TAKE 1 TABLET BY MOUTH ONCE DAILY. DO NOT TAKE WHILE TAKING DULOXETINE [name], MD  Active   clopidogrel (PLAVIX) 75 MG tablet [id number] No TAKE 1 TABLET BY MOUTH IN THE [name], MD 02/27/2022 Active Self  Continuous Blood Gluc Sensor (FREESTYLE LIBRE 3 SENSOR) MISC [id number]  Place 1 sensor on the skin every 14 days. Use to check glucose continuously [name], MD  Active   docusate sodium (COLACE) 100 MG capsule [id number] No Take 1 capsule (100 mg total) by mouth 2 (two) times daily.  Patient not taking: Reported on 04/14/2022   [name], MD Not Taking Active Self  Dulaglutide (TRULICITY) 1.5 NO/6.7EH SOPN [id number]  Inject 1.5 mg into the skin once a week. [name], MD  Active   Evolocumab [facility] SURECLICK) 836 MG/ML [name] [id number]  INJECT 1 PEN INTO THE SKIN EVERY 14 DAYS Burnell Blanks, MD  Active   exemestane (AROMASIN) 25 MG tablet [id number] No Take 1 tablet (25 mg total) by mouth daily after breakfast. [name] Pike, MD 03/06/2022 0730 Active Self  ezetimibe (ZETIA) 10 MG tablet [id number] No Take 1 tablet (10 mg total) by mouth daily. [name] Burn, PA-C 03/06/2022 0730 Expired 05/26/22 2359   ferrous sulfate 325 (65 FE) MG tablet [id number]  Take 1 tablet (325 mg total) by mouth every other  day. [name] Axe, MD  Active   fexofenadine [facility]) 180 MG tablet [id number]  Take 1 tablet (180 mg total) by mouth daily. [name], PA-C  Active   fluticasone (FLONASE) 50 MCG/ACT nasal spray [id number]  Place 1 spray into both nostrils daily. Begin by using 2 sprays in each nare daily for 3 to 5  days, then decrease to 1 spray in each nare daily. [name], PA-C  Active   Discontinued 05/09/20 1429   Fluticasone-Umeclidin-Vilant (TRELEGY ELLIPTA) 100-62.5-25 MCG/ACT AEPB [id number] No INHALE 1 PUFF ONCE DAILY Strength: 100-62.5-25 MCG/ACT Lacinda Axon, MD 03/06/2022 0730 Active Self  furosemide (LASIX) 40 MG tablet [id number] No Take 1 tablet (40 mg total) by mouth as needed for fluid or edema. [name] Heck, MD Past Week Active Self  gabapentin (NEURONTIN) 300 MG capsule [id number]  TAKE 1 CAPSULE BY MOUTH IN THE MORNING AND 3 AT BEDTIME [name], MD  Active   glucose blood (ACCU-CHEK GUIDE) test strip [id number] No Check blood sugar 2 times per day [name], MD Taking Active Self  insulin glargine-yfgn (SEMGLEE) 100 UNIT/ML Pen [id number]  Inject 30 Units into the skin at bedtime. [name] Axe, MD  Active   Insulin Pen Needle 30G X 5 MM MISC [id number] No 1 application by Does not apply route daily. [name], MD Taking Active Self  losartan-hydrochlorothiazide [facility]) 100-25 MG tablet [id number] No Take 1 tablet by mouth daily. [name], MD 03/05/2022 Active Self  metFORMIN (GLUCOPHAGE-XR) 500 MG 24 hr tablet [id number]  Take 1 tablet (500 mg total) by mouth 2 (two) times daily. [name] Axe, MD  Active   methocarbamol (ROBAXIN) 500 MG tablet [id number]  Take 1 tablet (500 mg total) by mouth every 6 (six) hours as needed for muscle spasms. [name], MD  Active   metoprolol succinate (TOPROL XL) 50 MG 24 hr tablet [id number] No Take 1 tablet (50 mg total) by mouth daily. Take with or immediately following a meal. [name], MD 03/06/2022 0730 Active Self  montelukast (SINGULAIR) 10 MG tablet [id number]  Take 1 tablet (10 mg total) by mouth at bedtime. [name], PA-C  Active   Potassium 99 MG TABS [id number] No Take 99 mg by mouth daily. [provider] Past Month Active Self  senna-docusate (SENNA-TIME S) 8.6-50  MG tablet [id number]  Take 1 tablet by mouth at bedtime as needed for mild constipation. [name] Axe, MD  Active   traMADol Veatrice Bourbon) 50 MG tablet [id number]  Take 1 tablet (50 mg total) by mouth every 6 (six) hours as needed. 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   Healthcare maintenance 11/17/2021   Stress reaction 11/17/2021   Spondylolisthesis, lumbar region 02/11/2021    Class: Acute   Malignant neoplasm of upper-outer quadrant of right breast in female, estrogen receptor positive ([place]) 06/10/2018   Restless leg syndrome 02/04/2018  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   Depression 08/06/2015   Hypertension associated with diabetes ([place])    Aortic stenosis    Chronic diastolic congestive heart failure (HCC)    COPD (chronic obstructive pulmonary disease) ([place]) 09/21/2013   Obstructive sleep apnea 09/21/2013   Nocturnal hypoxemia 09/12/2013   Hyperlipidemia 11/30/2012   History of pulmonary embolus (PE) 07/09/2012   Normocytic anemia 02/08/2012   Type 2 diabetes mellitus without complication, without long-term current use of insulin ([place]) 02/08/2012   Asthma 02/08/2012   GERD (gastroesophageal reflux disease)    CAD (coronary artery disease) 12/08/2011   Conditions to be addressed/monitored per PCP order:  Chronic healthcare management needs, HTN, DM, CHF, CAD, asthma, COPD, OSA,  anxiety/depression, arthritis, GERD, RLS, HLD  Care Plan : RN Care Manager Plan of Care  Updates made by Gayla Medicus, RN since 07/25/2022 12:00 AM     Problem: Health Promotion or Disease Self-Management (General Plan of Care)   Priority: High  Onset Date: 04/14/2022     Long-Range Goal: Chronic Disease Management and Care Coordination Needs   Start Date: 04/14/2022  Expected End Date: 10/25/2022  Recent Progress: Not on track  Priority: High  Note:   Current Barriers:  Knowledge Deficits related to plan of care for management of Anxiety and Depression along with other chronic health conditions, HTN, DM Chronic Disease Management support and education needs related to Anxiety and Depression. DM, HTN 07/25/22:  Patient states she is doing better today-seeing counselor and waiting to see [place] and medications adjusted-states she is feeling better about things.  BP 132/62 at PCP appt and blood sugar 144 today.  RNCM Clinical Goal(s):  Patient will verbalize understanding of plan for management of Anxiety and Depression, DM, HTN  as evidenced by patient report take all medications exactly as prescribed and will call provider for medication related questions as evidenced by patient report demonstrate understanding of rationale for each prescribed medication as evidenced by patient report attend all scheduled medical appointments  as evidenced by patient report continue to work with RN Care Manager to address care management and care coordination needs related to  Anxiety and Depression, DM, HTN as evidenced by adherence to CM Team Scheduled appointments work with Education officer, museum to address  related to the management of Mental Health Concerns  related to the management of Anxiety and Depression as evidenced by review of EMR and patient or Education officer, museum report through collaboration with Consulting civil engineer, provider, and care team.   Interventions: Inter-disciplinary  care team collaboration (see longitudinal plan of care) Evaluation of current treatment plan related to  self management and patient's adherence to plan as established by provider   (Status:  New goal.)  Long Term Goal Evaluation of current treatment plan related to Anxiety and Depression, Mental Health Concerns  self-management and patient's adherence to plan as established by provider. Discussed plans with patient for ongoing care management follow up and provided patient with direct contact information for care management team Advised patient to contact provider for any additional DME needs, CBG, and possible general surgeon referral. Reviewed medications with patient Collaborated with LCSW regarding anxiety/depression Reviewed scheduled/upcoming provider appointments Advised patient, providing education and rationale, to check cbg and record, calling provider  for findings outside established parameters  Social Work referral for anxiety/depression Discussed plans with patient for ongoing care management follow up and provided patient with direct contact information for care management team Assessed social determinant of health barriers  Patient Goals/Self-Care Activities: Take all medications as prescribed Attend all scheduled provider appointments Call pharmacy for medication refills 3-7 days in advance of running out of medications Perform all self care activities independently  Perform IADL's (shopping, preparing meals, housekeeping, managing finances) independently Call provider office for new concerns or questions  Work with the social worker to address care coordination needs and will continue to work with the clinical team to address health care and disease management related needs  Follow Up Plan:  The patient has been provided with contact information for the care management team and has been advised to call with any health related questions or concerns.  The care management team  will reach out to the patient again over the next 30 business  days.     Long-Range Goal: Establish Plan of Care for Chronic Disease Management Needs   Start Date: 04/14/2022  Expected End Date: 07/14/2022  Priority: High  Note:   Timeframe:  Long-Range Goal Priority:  High Start Date:      04/14/22                       Expected End Date:     ongoing                  Follow Up Date 09/03/22   - schedule appointment for flu shot - schedule appointment for vaccines needed due to my age or health - schedule recommended health tests (blood work, mammogram, colonoscopy, pap test) - schedule and keep appointment for annual check-up    Why is this important?   Screening tests can find diseases early when they are easier to treat.  Your doctor or nurse will talk with you about which tests are important for you.  Getting shots for common diseases like the flu and shingles will help prevent them.    07/25/22:  Patient seeing counselor and waiting on Psychiatry appt.  Recent follow ups with PCP and Orthopedist   Follow Up:  Patient agrees to Care Plan and Follow-up.  Plan: The Managed Medicaid care management team will reach out to the patient again over the next 30 business days. and The  Patient has been provided with contact information for the Managed Medicaid care management team and has been advised to call with any health related questions or concerns.  Date/time of next scheduled RN care management/care coordination outreach:  09/02/22 at 1230

## 2022-07-25 NOTE — Patient Instructions (Signed)
Hey [name], thanks you for speaking with me today-I am sorry about the video call mix up.  Have a nice day and weekend!!  [name] was given information about Medicaid Managed Care team care coordination services as a part of their [facility] Medicaid benefit. [name] verbally consented to engagement with the [facility] Managed Care team.   If you are experiencing a medical emergency, please call 911 or report to your local emergency department or urgent care.   If you have a non-emergency medical problem during routine business hours, please contact your provider's office and ask to speak with a nurse.   For questions related to your [facility] health plan, please call: [phone] or go here:[email or web address]  If you would like to schedule transportation through your [facility] plan, please call the following number at least 2 days in advance of your appointment: [phone].  You can also use the MTM portal or MTM mobile app to manage your rides. For the portal, please go to [email or web address].  Call the [place] at [phone], at any time, 24 hours a day, 7 days a week. If you are in danger or need immediate medical attention call 911.  If you would like help to quit smoking, call 1-800-QUIT-NOW (979) 632-4913) OR Espaol: 1-855-Djelo-Ya (2-774-128-7867) o para ms informacin haga clic aqu or Text READY to 200-400 to register via text  [name] - following are the goals we discussed in your visit today:   Goals Addressed    Priority: High  Note:   Timeframe:  Long-Range Goal Priority:  High Start Date:      04/14/22                       Expected End Date:     ongoing                  Follow Up Date 09/03/22   - schedule appointment for flu shot - schedule appointment for vaccines needed due to my age or health - schedule recommended health tests (blood work, mammogram, colonoscopy, pap test) - schedule and keep  appointment for annual check-up    Why is this important?   Screening tests can find diseases early when they are easier to treat.  Your doctor or nurse will talk with you about which tests are important for you.  Getting shots for common diseases like the flu and shingles will help prevent them.    07/25/22:  Patient seeing counselor and waiting on Psychiatry appt.  Recent follow ups with PCP and Orthopedist   Patient verbalizes understanding of instructions and care plan provided today and agrees to view in [place]. Active MyChart status and patient understanding of how to access instructions and care plan via MyChart confirmed with patient.     The Managed Medicaid care management team will reach out to the patient again over the next 30 business  days.  The  Patient  has been provided with contact information for the Managed Medicaid care management team and has been advised to call with any health related questions or concerns.   Aida Raider RN, BSN [place] Management Coordinator - Managed Medicaid High Risk [phone]   Following is a copy of your plan of care:  Care Plan : [place] of Care  Updates made by Gayla Medicus, RN since 07/25/2022 12:00 AM     Problem: Health Promotion or Disease  Self-Management (General Plan of Care)   Priority: High  Onset Date: 04/14/2022     Long-Range Goal: Chronic Disease Management and Care Coordination Needs   Start Date: 04/14/2022  Expected End Date: 10/25/2022  Recent Progress: Not on track  Priority: High  Note:   Current Barriers:  Knowledge Deficits related to plan of care for management of Anxiety and Depression along with other chronic health conditions, HTN, DM Chronic Disease Management support and education needs related to Anxiety and Depression. DM, HTN 07/25/22:  Patient states she is doing better today-seeing counselor and waiting to see [place] and  medications adjusted-states she is feeling better about things.  BP 132/62 at PCP appt and blood sugar 144 today.  RNCM Clinical Goal(s):  Patient will verbalize understanding of plan for management of Anxiety and Depression, DM, HTN  as evidenced by patient report take all medications exactly as prescribed and will call provider for medication related questions as evidenced by patient report demonstrate understanding of rationale for each prescribed medication as evidenced by patient report attend all scheduled medical appointments  as evidenced by patient report continue to work with RN Care Manager to address care management and care coordination needs related to  Anxiety and Depression, DM, HTN as evidenced by adherence to CM Team Scheduled appointments work with Education officer, museum to address  related to the management of Mental Health Concerns  related to the management of Anxiety and Depression as evidenced by review of EMR and patient or Education officer, museum report through collaboration with Consulting civil engineer, provider, and care team.   Interventions: Inter-disciplinary care team collaboration (see longitudinal plan of care) Evaluation of current treatment plan related to  self management and patient's adherence to plan as established by provider   (Status:  New goal.)  Long Term Goal Evaluation of current treatment plan related to Anxiety and Depression, Mental Health Concerns  self-management and patient's adherence to plan as established by provider. Discussed plans with patient for ongoing care management follow up and provided patient with direct contact information for care management team Advised patient to contact provider for any additional DME needs, CBG, and possible general surgeon referral. Reviewed medications with patient Collaborated with LCSW regarding anxiety/depression Reviewed scheduled/upcoming provider appointments Advised patient, providing education and rationale, to check cbg  and record, calling provider  for findings outside established parameters  Social Work referral for anxiety/depression Discussed plans with patient for ongoing care management follow up and provided patient with direct contact information for care management team Assessed social determinant of health barriers  Patient Goals/Self-Care Activities: Take all medications as prescribed Attend all scheduled provider appointments Call pharmacy for medication refills 3-7 days in advance of running out of medications Perform all self care activities independently  Perform IADL's (shopping, preparing meals, housekeeping, managing finances) independently Call provider office for new concerns or questions  Work with the social worker to address care coordination needs and will continue to work with the clinical team to address health care and disease management related needs  Follow Up Plan:  The patient has been provided with contact information for the care management team and has been advised to call with any health related questions or concerns.  The care management team will reach out to the patient again over the next 30 business  days.

## 2022-07-29 DIAGNOSIS — Z419 Encounter for procedure for purposes other than remedying health state, unspecified: Secondary | ICD-10-CM | POA: Diagnosis not present

## 2022-07-30 ENCOUNTER — Ambulatory Visit (INDEPENDENT_AMBULATORY_CARE_PROVIDER_SITE_OTHER): Payer: Medicaid Other | Admitting: Licensed Clinical Social Worker

## 2022-07-30 DIAGNOSIS — F411 Generalized anxiety disorder: Secondary | ICD-10-CM | POA: Diagnosis not present

## 2022-07-30 DIAGNOSIS — F331 Major depressive disorder, recurrent, moderate: Secondary | ICD-10-CM

## 2022-07-30 DIAGNOSIS — F4310 Post-traumatic stress disorder, unspecified: Secondary | ICD-10-CM

## 2022-07-30 NOTE — Progress Notes (Signed)
   THERAPIST PROGRESS NOTE  Session Time: 40  Participation Level: Active  Behavioral Response: CasualAlertAnxious and Depressed  Type of Therapy: Individual Therapy  Treatment Goals addressed: Patient will participate in at least 80% of scheduled individual psychotherapy  sessions  ProgressTowards Goals: Progressing  Interventions: CBT and Motivational Interviewing  Summary: [name] is a 61 y.o. female who presents with depressed and anxious mood\affect.  Patient was pleasant, cooperative, maintained good eye contact.  She engaged well in therapy session was dressed casually.  Patient comes in today with primary stressors of 1 children 2 living\housing 3 trauma from childhood 4  fighting with spouse and 5 music.  LCSW spoke today in detail about her communication with her children.  Patient reports with her oldest son her communication and relationship is "good".  Patient reports with her youngest son communication is short and flat.  Patient states that she tries to engage and identify with her youngest son but at times he feels uninterested. Suicidal/Homicidal: Nowithout intent/plan  Therapist Response:     Intervention/Plan: LCSW educated patient on different communication techniques.  LCSW provided worksheet for "communication techniques" by therapist [email or web address].  LCSW spoke with patient about open-ended questions versus close ended questions.  LCSW spoke with patient about direct lines of communication.  LCSW educated patient on taking medications as prescribed.  Plan for patient is to review worksheet and bring back 3 reasons why it worked, did not work, or communications stayed the same.  Plan: Return again in 3 weeks.  Diagnosis: PTSD (post-traumatic stress disorder)  GAD (generalized anxiety disorder)  Major depressive disorder, recurrent episode, moderate ([place])  Collaboration of Care: Other None today   Patient/Guardian was advised Release of Information must  be obtained prior to any record release in order to collaborate their care with an outside provider. Patient/Guardian was advised if they have not already done so to contact the registration department to sign all necessary forms in order for [place] to release information regarding their care.   Consent: Patient/Guardian gives verbal consent for treatment and assignment of benefits for services provided during this visit. Patient/Guardian expressed understanding and agreed to proceed.   Dory Horn, LCSW 07/30/2022

## 2022-07-30 NOTE — Plan of Care (Signed)
  Problem: Anxiety Disorder CCP Problem  1 GAD  Goal: STG: Patient will participate in at least 80% of scheduled individual psychotherapy sessions Outcome: Progressing   Problem: Depression CCP Problem  1 MDD Goal: STG: Sarha WILL PARTICIPATE IN AT LEAST 80% OF SCHEDULED INDIVIDUAL PSYCHOTHERAPY SESSIONS Outcome: Progressing Goal: STG: Baljit WILL COMPLETE AT LEAST 80% OF ASSIGNED HOMEWORK Outcome: Progressing

## 2022-08-01 ENCOUNTER — Other Ambulatory Visit: Payer: Self-pay | Admitting: Licensed Clinical Social Worker

## 2022-08-01 NOTE — Patient Instructions (Signed)
Visit Information  Ms. Peabody was given information about Medicaid Managed Care team care coordination services as a part of their [facility] Medicaid benefit. [name] verbally consented to engagement with the [facility] Managed Care team.   If you are experiencing a medical emergency, please call 911 or report to your local emergency department or urgent care.   If you have a non-emergency medical problem during routine business hours, please contact your provider's office and ask to speak with a nurse.   For questions related to your [facility] health plan, please call: [phone] or go here:[email or web address]  If you would like to schedule transportation through your [facility] plan, please call the following number at least 2 days in advance of your appointment: [phone].  You can also use the MTM portal or MTM mobile app to manage your rides. For the portal, please go to [email or web address].  Call the [place] at [phone], at any time, 24 hours a day, 7 days a week. If you are in danger or need immediate medical attention call 911.  If you would like help to quit smoking, call 1-800-QUIT-NOW (432) 307-2713) OR Espaol: 1-855-Djelo-Ya (4-081-448-1856) o para ms informacin haga clic aqu or Text READY to 200-400 to register via text  Following is a copy of your plan of care:  Care Plan : LCSW Plan of Care  Updates made by Greg Cutter, LCSW since 08/01/2022 12:00 AM     Problem: Depression Identification (Depression)      Long-Range Goal: Depressive Symptoms Identified   Start Date: 04/17/2022  Recent Progress: On track  Note:   Long-Range Goal: I want to start mental health treatment    Start Date: 04/17/2022  Priority: High  Note:   Priority: High   Timeframe:  Long-Range Goal Priority:  High Start Date:   04/17/22                Expected End Date:  ongoing                 Follow Up Date--08/05/22 at 10:30 am.     - check out counseling - keep 90 percent of counseling appointments - schedule counseling appointment    Why is this important?             Beating depression may take some time.            If you don't feel better right away, don't give up on your treatment plan.     Current barriers:   Chronic Mental Health needs related to depression, stress and anxiety. Patient requires Support, Education, Resources, Referrals, Advocacy, and Care Coordination, in order to meet Unmet Mental Health Needs. Patient will implement clinical interventions discussed today to decrease symptoms of depression and increase knowledge and/or ability of: coping skills. Mental Health Concerns and Social Isolation Patient lacks knowledge of available community counseling agencies and resources.   Clinical Goal(s): verbalize understanding of plan for management of Anxiety, Depression, and Stress and demonstrate a reduction in symptoms. Patient will connect with a provider for ongoing mental health treatment, increase coping skills, healthy habits, self-management skills, and stress reduction       Patient Goals/Self-Care Activities: Over the next 120 days Attend scheduled medical appointments Utilize healthy coping skills and supportive resources discussed Contact PCP with any questions or concerns Keep 90 percent of counseling appointments Call your insurance provider for more information about your Enhanced Benefits  Check out counseling resources provided  Begin personal  counseling with LCSW, to reduce and manage symptoms of Depression and Stress, until well-established with mental health provider Accept all calls from representative with [facility] in an effort to establish ongoing mental health counseling and supportive services. Incorporate into daily practice - relaxation techniques, deep breathing exercises, and mindfulness meditation strategies. Talk about feelings with friends, family members, spiritual advisor,  etc. Contact LCSW directly [phone]), if you have questions, need assistance, or if additional social work needs are identified between now and our next scheduled telephone outreach call. Call 988 for mental health hotline/crisis line if needed (24/7 available) Try techniques to reduce symptoms of anxiety/negative thinking (deep breathing, distraction, positive self talk, etc)  - develop a personal safety plan - develop a plan to deal with triggers like holidays, anniversaries - exercise at least 2 to 3 times per week - have a plan for how to handle bad days - journal feelings and what helps to feel better or worse - spend time or talk with others at least 2 to 3 times per week - watch for early signs of feeling worse - begin personal counseling - call and visit an old friend - check out volunteer opportunities - join a support group - laugh; watch a funny movie or comedian - learn and use visualization or guided imagery - perform a random act of kindness - practice relaxation or meditation daily - start or continue a personal journal - practice positive thinking and self-talk -continue with compliance of taking medication        05/21/2022   12:53 PM 04/17/2022    3:17 PM 11/14/2021    4:02 PM 08/21/2021    4:33 PM 12/10/2020    4:00 PM  Depression screen PHQ 2/9  Decreased Interest '2 2 1 '$ 0 1  Down, Depressed, Hopeless '2 2 1 '$ 0 0  PHQ - 2 Score '4 4 2 '$ 0 1  Altered sleeping '3 3 3   1  '$ Tired, decreased energy '3 3 1   1  '$ Change in appetite '3 3 1   1  '$ Feeling bad or failure about yourself  0 1 0   0  Trouble concentrating 0 0 0   0  Moving slowly or fidgety/restless 0   0   0  Suicidal thoughts 0 0 0   0  PHQ-9 Score '13 14 7   4  '$ Difficult doing work/chores Somewhat difficult Somewhat difficult Not difficult at all   Not difficult at all        24- Hour Availability:    [facility]  [address] [place], [place] [facility]  [phone]   [facility] [employer] [phone]   [place]  [phone]    [place]  [phone] (after hours)   Therapeutic Alternative/Mobile Crisis   2194300560   [place] National Suicide Hotline  8327468606 (TALK) OR 988   Call 911 or go to emergency room   [facility]  [phone]);  [place] and [employer]  [phone]); [place], [place], [place], [place], [place], [place], [place], [place], MSW, [employer] Managed Medicaid [email or web address] [email or web address] Phone: [phone]

## 2022-08-01 NOTE — Patient Outreach (Addendum)
Medicaid Managed Care Social Work Note  08/01/2022 Name:  YARITHZA MINK MRN:  [id number] DOB:  1961/02/15  [name] is an 61 y.o. year old female who is a primary patient of [name], MD.  The [place] team was consulted for assistance with:  [place] and Resources  Ms. Graul was given information about Medicaid Managed Care Coordination team services today. [name] Patient agreed to services and verbal consent obtained.  Engaged with patient  for by telephone forfollow up visit in response to referral for case management and/or care coordination services.   Assessments/Interventions:  Review of past medical history, allergies, medications, health status, including review of consultants reports, laboratory and other test data, was performed as part of comprehensive evaluation and provision of chronic care management services.  SDOH: (Social Determinant of Health) assessments and interventions performed: SDOH Interventions    Flowsheet Row Most Recent Value  SDOH Interventions   Stress Interventions Live Life Well, [employer] Resources       Advanced Directives Status:  See Care Plan for related entries.  Care Plan                 No Known Allergies  Medications Reviewed Today     Reviewed by Greg Cutter, LCSW (Social Worker) on 08/01/22 at 1034  Med List Status: <None>   Medication Order Taking? Sig Documenting Provider Last Dose Status Informant  Accu-Chek FastClix Lancets MISC [id number] No Check blood sugar two times a day Lorella Nimrod, MD Taking Active Self  albuterol (PROVENTIL) (2.5 MG/3ML) 0.083% nebulizer solution [id number]  USE 1 VIAL IN NEBULIZER EVERY 4 HOURS AS NEEDED FOR WHEEZING FOR SHORTNESS OF Loyal Gambler, MD  Active   amlodipine-atorvastatin (CADUET) 5-80 MG tablet [id number] No Take 1 tablet by mouth daily. [name], MD 03/06/2022 0730 Active  Self  Ascorbic Acid (VITAMIN C) 1000 MG tablet [id number] No Take 1,000 mg by mouth daily. [provider] Past Month Active Self  ASPIRIN ADULT LOW STRENGTH 81 MG EC tablet [id number] No TAKE 1 Tablet BY MOUTH ONCE DAILY Lorella Nimrod, MD 02/27/2022 Active Self  Blood Glucose Monitoring Suppl (ACCU-CHEK GUIDE) w/Device KIT [id number] No 1 each by Does not apply route 2 (two) times daily. Lorella Nimrod, MD Taking Active Self  citalopram (CELEXA) 20 MG tablet [id number]  TAKE 1 TABLET BY MOUTH ONCE DAILY. DO NOT TAKE WHILE TAKING DULOXETINE [name], MD  Active   clopidogrel (PLAVIX) 75 MG tablet [id number] No TAKE 1 TABLET BY MOUTH IN THE [name], MD 02/27/2022 Active Self  Continuous Blood Gluc Sensor (FREESTYLE LIBRE 3 SENSOR) MISC [id number]  Place 1 sensor on the skin every 14 days. Use to check glucose continuously [name], MD  Active   docusate sodium (COLACE) 100 MG capsule [id number] No Take 1 capsule (100 mg total) by mouth 2 (two) times daily.  Patient not taking: Reported on 04/14/2022   [name], MD Not Taking Active Self  Dulaglutide (TRULICITY) 1.5 AT/5.5DD SOPN [id number]  Inject 1.5 mg into the skin once a week. [name], MD  Active   Evolocumab [facility] SURECLICK) 623 MG/ML [name] [id number]  INJECT 1 PEN INTO THE SKIN EVERY 14 DAYS Burnell Blanks, MD  Active   exemestane (AROMASIN) 25 MG tablet [id number] No Take 1 tablet (25 mg total) by mouth daily after breakfast. [name], MD 03/06/2022 0730 Active Self  ezetimibe (ZETIA) 10 MG tablet [id number] No  Take 1 tablet (10 mg total) by mouth daily. [name], PA-C 03/06/2022 0730 Expired 05/26/22 2359   ferrous sulfate 325 (65 FE) MG tablet [id number]  Take 1 tablet (325 mg total) by mouth every other day. [name], MD  Active   fexofenadine [facility]) 180 MG tablet [id number]  Take 1 tablet (180 mg total) by mouth daily. [name], PA-C  Active   fluticasone  (FLONASE) 50 MCG/ACT nasal spray [id number]  Place 1 spray into both nostrils daily. Begin by using 2 sprays in each nare daily for 3 to 5 days, then decrease to 1 spray in each nare daily. [name], PA-C  Active   Discontinued 05/09/20 1429   Fluticasone-Umeclidin-Vilant (TRELEGY ELLIPTA) 100-62.5-25 MCG/ACT AEPB [id number] No INHALE 1 PUFF ONCE DAILY Strength: 100-62.5-25 MCG/ACT Lacinda Axon, MD 03/06/2022 0730 Active Self  furosemide (LASIX) 40 MG tablet [id number] No Take 1 tablet (40 mg total) by mouth as needed for fluid or edema. [name], MD Past Week Active Self  gabapentin (NEURONTIN) 300 MG capsule [id number]  TAKE 1 CAPSULE BY MOUTH IN THE MORNING AND 3 AT BEDTIME [name], MD  Active   glucose blood (ACCU-CHEK GUIDE) test strip [id number] No Check blood sugar 2 times per day [name], MD Taking Active Self  insulin glargine-yfgn (SEMGLEE) 100 UNIT/ML Pen [id number]  Inject 30 Units into the skin at bedtime. [name], MD  Active   Insulin Pen Needle 30G X 5 MM MISC [id number] No 1 application by Does not apply route daily. [name], MD Taking Active Self  losartan-hydrochlorothiazide [facility]) 100-25 MG tablet [id number] No Take 1 tablet by mouth daily. [name], MD 03/05/2022 Active Self  metFORMIN (GLUCOPHAGE-XR) 500 MG 24 hr tablet [id number]  Take 1 tablet (500 mg total) by mouth 2 (two) times daily. [name], MD  Active   methocarbamol (ROBAXIN) 500 MG tablet [id number]  Take 1 tablet (500 mg total) by mouth every 6 (six) hours as needed for muscle spasms. [name], MD  Active   metoprolol succinate (TOPROL XL) 50 MG 24 hr tablet [id number] No Take 1 tablet (50 mg total) by mouth daily. Take with or immediately following a meal. [name], MD 03/06/2022 0730 Active Self  montelukast (SINGULAIR) 10 MG tablet [id number]  Take 1 tablet (10 mg total) by mouth at bedtime. [name], PA-C  Active   Potassium  99 MG TABS [id number] No Take 99 mg by mouth daily. [provider] Past Month Active Self  senna-docusate (SENNA-TIME S) 8.6-50 MG tablet [id number]  Take 1 tablet by mouth at bedtime as needed for mild constipation. [name], MD  Active   traMADol Veatrice Bourbon) 50 MG tablet [id number]  Take 1 tablet (50 mg total) by mouth every 6 (six) hours as needed. Hazel Sams, PA-C  Active   VENTOLIN HFA 108 (90 Base) MCG/ACT inhaler [id number]  INHALE 2 PUFFS BY MOUTH EVERY 6 HOURS AS NEEDED FOR WHEEZING OR SHORTNESS OF Loyal Gambler, MD  Active             Patient Active Problem List   Diagnosis Date Noted   PTSD (post-traumatic stress disorder) 06/24/2022   GAD (generalized anxiety disorder) 06/24/2022   Major depressive disorder, recurrent episode, moderate ([place]) 06/24/2022   Urinary incontinence 06/20/2022   Dysuria 06/20/2022   Intertrigo 06/20/2022   Arthritis of left shoulder region    S/P reverse total shoulder arthroplasty, 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   Healthcare maintenance  11/17/2021   Stress reaction 11/17/2021   Spondylolisthesis, lumbar region 02/11/2021    Class: Acute   Malignant neoplasm of upper-outer quadrant of right breast in female, estrogen receptor positive ([place]) 06/10/2018   Restless leg syndrome 02/04/2018   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   Depression 08/06/2015   Hypertension associated with diabetes ([place])    Aortic stenosis    Chronic diastolic congestive heart failure ([place])    COPD (chronic obstructive pulmonary disease) ([place]) 09/21/2013   Obstructive sleep apnea 09/21/2013   Nocturnal hypoxemia 09/12/2013   Hyperlipidemia 11/30/2012   History of pulmonary embolus (PE) 07/09/2012   Normocytic anemia 02/08/2012   Type 2 diabetes mellitus without complication, without long-term current use of insulin ([place]) 02/08/2012   Asthma 02/08/2012   GERD (gastroesophageal reflux disease)    CAD (coronary artery disease) 12/08/2011     Conditions to be addressed/monitored per PCP order:  Anxiety and Depression  Care Plan : LCSW Plan of Care  Updates made by Greg Cutter, LCSW since 08/01/2022 12:00 AM     Problem: Depression Identification (Depression)      Long-Range Goal: Depressive Symptoms Identified   Start Date: 04/17/2022  Recent Progress: On track  Note:   Long-Range Goal: I want to start mental health treatment    Start Date: 04/17/2022  Priority: High  Note:   Priority: High   Timeframe:  Long-Range Goal Priority:  High Start Date:   04/17/22                Expected End Date:  ongoing                 Follow Up Date--08/05/22 at 10:30 am.    - check out counseling - keep 90 percent of counseling appointments - schedule counseling appointment    Why is this important?             Beating depression may take some time.            If you don't feel better right away, don't give up on your treatment plan.     Current barriers:   Chronic Mental Health needs related to depression, stress and anxiety. Patient requires Support, Education, Resources, Referrals, Advocacy, and Care Coordination, in order to meet Unmet Mental Health Needs. Patient will implement clinical interventions discussed today to decrease symptoms of depression and increase knowledge and/or ability of: coping skills. Mental Health Concerns and Social Isolation Patient lacks knowledge of available community counseling agencies and resources.   Clinical Goal(s): verbalize understanding of plan for management of Anxiety, Depression, and Stress and demonstrate a reduction in symptoms. Patient will connect with a provider for ongoing mental health treatment, increase coping skills, healthy habits, self-management skills, and stress reduction        Clinical Interventions:  Assessed patient's previous and current treatment, coping skills, support system and barriers to care. Patient provided history. Patient and spouse lived in a shared  home with two other adults. Patient reports that this is a current and stable living situation.  Verbalization of feelings encouraged, motivational interviewing employed Emotional support provided, positive coping strategies explored Self care/establishing healthy boundaries emphasized Patient reports that she takes medication for anxiety and depression. She reports that this has alleviated some of symptoms but not all. She reports that she still has crying spells. She needs help with finding psychiatry and counseling. She was successful in identifying triggers to anxiety and depression symptoms, in addition, to healthy coping skills.  Patient reports significant worsening anxiety and depression impacting her ability to function appropriately and carry out daily task. Patient receives strong support from spouse but he does not have anxiety or depression and is unable to relate to her and her daily mental health struggles.  Patient is agreeable to referral to [facility] for counseling and psychiatry. [facility] LCSW made referral on 04/17/22. Patient will call [facility] tomorrow on 04/18/22 to schedule counseling and psychiatry appointments. Email sent to her with [facility]'s contact information.  LCSW provided education on relaxation techniques such as meditation, deep breathing, massage, grounding exercises or yoga that can activate the body's relaxation response and ease symptoms of stress and anxiety. LCSW ask that when pt is struggling with difficult emotions and racing thoughts that they start this relaxation response process. LCSW provided extensive education on healthy coping skills for anxiety. SW used active and reflective listening, validated patient's feelings/concerns, and provided emotional support. Patient will work on implementing appropriate self-care habits into their daily routine such as: staying positive, writing a gratitude list, drinking water, staying active around the house, taking their medications as  prescribed, combating negative thoughts or emotions and staying connected with their family and friends. Positive reinforcement provided for this decision to work on this. Patient was receptive to anxiety and depression management coping skill education. She will try to increase her socialization over the next 90 days to decrease isolation.  Patient has problems with sleep disturbance. LCSW provided education on healthy sleep hygiene and what that looks like. LCSW encouraged patient to implement a night time routine into her schedule that works best for her and that she is able to maintain. Advised patient to implement deep breathing/grounding/meditation/self-care exercises into her nightly routine to combat racing thoughts at night. LCSW encouraged patient to wake up at the same time each day, make their sleeping environment comfortable, exercise when able, to limit naps and to not eat or drink anything right before bed.   Motivational Interviewing employed Depression screen reviewed  PHQ2/ PHQ9 completed Mindfulness or Relaxation training provided Active listening / Reflection utilized  Advance Care and HCPOA education provided Emotional Support Provided Problem [place] strategies reviewed Provided psychoeducation for mental health needs  Provided brief CBT  Reviewed mental health medications and discussed importance of compliance:  Quality of sleep assessed & Sleep Hygiene techniques promoted  Participation in counseling encouraged  Verbalization of feelings encouraged  Suicidal Ideation/Homicidal Ideation assessed: Patient denies SI/HI  Review resources, discussed options and provided patient information about  [place] care team collaboration (see longitudinal plan of care) UPDATE 05/21/22- Patient was successfully set up with counseling at [facility] but was not set up with psychiatry. She is in need of both services. Patient has an upcoming  counseling session at [facility] on 06/24/22. [facility] LCSW completed joint call with patient to [facility] but was unable to reach anyone. [facility] LCSW left a voice message asking for them to return call to patient to get her scheduled with psychiatry as [facility] LCSW ask for both psychiatry and counseling in referral. [facility] LCSW ask if they needed an additional referral to please let [facility] LCSW know so this can be completed. [facility] LCSW sent patient an email with [facility]'s contact information including their address as well as a list of coping skills for depression and anxiety. Patient's spouse has multiple health conditions and tells patient that he "wishes to die" often which concerns her. [facility] LCSW provided emotional support and brief coping skill education on stress management.  UPDATE 06/12/22- Patient successfully contacted [facility] and set up appointments for both psychiatry and counseling. Patient is eager to start treatment. Brief self-care education provided to patient. Patient is agreeable to contact [facility] LCSW directly if needed. [facility] LCSW will make one last follow up on call to ensure that patient was successfully connected to a long term mental health provider. Patient denies any current crises or concerns.  UPDATE 07/18/22- Patient has not been set up with counseling at [facility] and she has completed her first counseling session but she has not been set up with psychiatry still. She has made several calls to agency but has been unsuccessful in reaching them. Patient has an in person appointment at [facility] on 07/30/22 and she will ask them to enroll her in psychiatry at that time. [facility] LCSW will follow up and continue to support patient as needed. UPDATE 08/01/22- Patient reports that she is doing extremely well and that her mood has lifted over the last few weeks. She reports that her first counselor appointment well very well and she was able to build rapport with him. She will see her counselor 2X per month. Her next appointment for therapy is on  08/20/22. Patient has her initial appointment with psychiatry is on the 08/12/22.   Patient Goals/Self-Care Activities: Over the next 120 days Attend scheduled medical appointments Utilize healthy coping skills and supportive resources discussed Contact PCP with any questions or concerns Keep 90 percent of counseling appointments Call your insurance provider for more information about your Enhanced Benefits  Check out counseling resources provided  Begin personal counseling with LCSW, to reduce and manage symptoms of Depression and Stress, until well-established with mental health provider Accept all calls from representative with [facility] in an effort to establish ongoing mental health counseling and supportive services. Incorporate into daily practice - relaxation techniques, deep breathing exercises, and mindfulness meditation strategies. Talk about feelings with friends, family members, spiritual advisor, etc. Contact LCSW directly [phone]), if you have questions, need assistance, or if additional social work needs are identified between now and our next scheduled telephone outreach call. Call [phone] for mental health hotline/crisis line if needed (24/7 available) Try techniques to reduce symptoms of anxiety/negative thinking (deep breathing, distraction, positive self talk, etc)  - develop a personal safety plan - develop a plan to deal with triggers like holidays, anniversaries - exercise at least 2 to 3 times per week - have a plan for how to handle bad days - journal feelings and what helps to feel better or worse - spend time or talk with others at least 2 to 3 times per week - watch for early signs of feeling worse - begin personal counseling - call and visit an old friend - check out volunteer opportunities - join a support group - laugh; watch a funny movie or comedian - learn and use visualization or guided imagery - perform a random act of kindness - practice relaxation or  meditation daily - start or continue a personal journal - practice positive thinking and self-talk -continue with compliance of taking medication        05/21/2022   12:53 PM 04/17/2022    3:17 PM 11/14/2021    4:02 PM 08/21/2021    4:33 PM 12/10/2020    4:00 PM  Depression screen PHQ 2/9  Decreased Interest '2 2 1 ' 0 1  Down, Depressed, Hopeless '2 2 1 ' 0 0  PHQ - 2 Score '4 4 2 ' 0 1  Altered sleeping 3 3  3   1  Tired, decreased energy '3 3 1   1  ' Change in appetite '3 3 1   1  ' Feeling bad or failure about yourself  0 1 0   0  Trouble concentrating 0 0 0   0  Moving slowly or fidgety/restless 0   0   0  Suicidal thoughts 0 0 0   0  PHQ-9 Score '13 14 7   4  ' Difficult doing work/chores Somewhat difficult Somewhat difficult Not difficult at all   Not difficult at all        24- Hour Availability:    [facility]  [address] [place], [place] Crisis [phone]   [facility] [employer] [place]  [phone]    [place]  [phone] (after hours)   Therapeutic Alternative/Mobile Crisis   [phone]   [place] National Suicide Hotline  712-459-8274 (TALK) OR 988   Call 911 or go to emergency room   [facility]  [phone]);  [place] and [employer]  [phone]); [place], [place], [place], [place], [place], [place], [place]           Follow up:  Patient agrees to Care Plan and Follow-up.  Plan: The Managed Medicaid care management team will reach out to the patient again over the next 45 days.  Date/time of next scheduled Social Work care management/care coordination outreach:  08/05/22 at 10:30 am.  [name], BSW, MSW, LCSW Managed Medicaid LCSW [email or web address] Phone: [phone]

## 2022-08-06 ENCOUNTER — Other Ambulatory Visit: Payer: Self-pay | Admitting: Student

## 2022-08-06 MED ORDER — MONTELUKAST SODIUM 10 MG PO TABS
10.0000 mg | ORAL_TABLET | Freq: Every day | ORAL | 2 refills | Status: DC
Start: 2022-08-06 — End: 2023-06-22

## 2022-08-06 NOTE — Telephone Encounter (Signed)
Refill Request- Pt states the pharmacy can not get in touch with [facility] to fill the following medication.  montelukast (SINGULAIR) 10 MG tablet  [id number] - [place], [place] - [place]

## 2022-08-07 ENCOUNTER — Other Ambulatory Visit: Payer: Self-pay | Admitting: Specialist

## 2022-08-07 ENCOUNTER — Telehealth: Payer: Self-pay | Admitting: *Deleted

## 2022-08-07 DIAGNOSIS — E119 Type 2 diabetes mellitus without complications: Secondary | ICD-10-CM

## 2022-08-07 NOTE — Telephone Encounter (Signed)
Call to patient's Pharmacy.  Prescription for Singulair was picked up on yesterday.  Call to patient looked at prescriptions has the Singulair.  Stated had gotten a message from pharmacy was not sure what it was about. Thought it was about her Singulair.

## 2022-08-07 NOTE — Telephone Encounter (Signed)
Patient requesting a refill on her Singulair.

## 2022-08-12 ENCOUNTER — Encounter (HOSPITAL_COMMUNITY): Payer: Self-pay | Admitting: Psychiatry

## 2022-08-12 ENCOUNTER — Ambulatory Visit (INDEPENDENT_AMBULATORY_CARE_PROVIDER_SITE_OTHER): Payer: Medicaid Other | Admitting: Psychiatry

## 2022-08-12 DIAGNOSIS — F411 Generalized anxiety disorder: Secondary | ICD-10-CM

## 2022-08-12 DIAGNOSIS — F4310 Post-traumatic stress disorder, unspecified: Secondary | ICD-10-CM

## 2022-08-12 DIAGNOSIS — F32A Depression, unspecified: Secondary | ICD-10-CM

## 2022-08-12 MED ORDER — TRAZODONE HCL 50 MG PO TABS: 25.0000 mg | ORAL_TABLET | Freq: Every day | ORAL | 3 refills | Status: DC

## 2022-08-12 MED ORDER — BUSPIRONE HCL 10 MG PO TABS: 10.0000 mg | ORAL_TABLET | Freq: Three times a day (TID) | ORAL | 3 refills | Status: DC

## 2022-08-12 MED ORDER — CITALOPRAM HYDROBROMIDE 20 MG PO TABS: ORAL_TABLET | ORAL | 3 refills | Status: DC
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
## 2022-08-19 ENCOUNTER — Other Ambulatory Visit: Payer: Self-pay

## 2022-08-19 DIAGNOSIS — I5032 Chronic diastolic (congestive) heart failure: Secondary | ICD-10-CM

## 2022-08-19 MED ORDER — FUROSEMIDE 40 MG PO TABS: 40.0000 mg | ORAL_TABLET | ORAL | 1 refills | Status: DC | PRN

## 2022-08-20 ENCOUNTER — Ambulatory Visit (INDEPENDENT_AMBULATORY_CARE_PROVIDER_SITE_OTHER): Payer: Medicaid Other | Admitting: Licensed Clinical Social Worker

## 2022-08-20 DIAGNOSIS — F331 Major depressive disorder, recurrent, moderate: Secondary | ICD-10-CM | POA: Diagnosis not present

## 2022-08-20 DIAGNOSIS — F4310 Post-traumatic stress disorder, unspecified: Secondary | ICD-10-CM

## 2022-08-20 DIAGNOSIS — F411 Generalized anxiety disorder: Secondary | ICD-10-CM

## 2022-08-20 NOTE — Telephone Encounter (Signed)
Patient scheduled to come in Wednesday Aug.30 '@10'$ :15

## 2022-08-20 NOTE — Progress Notes (Signed)
   THERAPIST PROGRESS NOTE  Session Time: 106  Participation Level: Active  Behavioral Response: CasualAlertAnxious and Depressed  Type of Therapy: Individual Therapy  Treatment Goals addressed:    ProgressTowards Goals: Progressing  Interventions: CBT, Motivational Interviewing, and Supportive  Summary: [name] is a 61 y.o. female who presents with euthymic mood\affect.  Patient was pleasant, cooperative, maintained good eye contact.  She engaged well in therapy session was dressed casually.  [name] was alert and oriented x5.  Patient comes in today stating that things have been going "well".  Patient reports using open ended questions and "I statements" in her communication with her son.  Patient reports more meaningful conversations and longer conversations with her youngest son [name].  Patient states "I have been really having a lot of fun with the communication techniques taught in my last session".  Suicidal/Homicidal: Nowithout intent/plan  Therapist Response:     Intervention/Plan: LCSW and patient went over the worksheet in today's session called "active listening".  This went over things such as nonverbal's, open-ended questions, and being present for conversations.  LCSW and patient also went over the worksheet for "gratitude exercise".  Gratitude exercise went over gratitude journal, gratitude walk, given thanks, gratitude conversation, and grateful contemplation.  Patient to review worksheets taught in session and implement them into daily routine.  Plan for patient is to bring back 3 good things that worked for her with new exercises taught in session.  Plan: Return again in 3 weeks.  Diagnosis: Major depressive disorder, recurrent episode, moderate (HCC)  GAD (generalized anxiety disorder)  PTSD (post-traumatic stress disorder)  Collaboration of Care: Other None today  Patient/Guardian was advised Release of Information must be obtained prior to  any record release in order to collaborate their care with an outside provider. Patient/Guardian was advised if they have not already done so to contact the registration department to sign all necessary forms in order for [place] to release information regarding their care.   Consent: Patient/Guardian gives verbal consent for treatment and assignment of benefits for services provided during this visit. Patient/Guardian expressed understanding and agreed to proceed.   Dory Horn, LCSW 08/20/2022

## 2022-08-20 NOTE — Plan of Care (Signed)
  Problem: Anxiety Disorder CCP Problem  1 GAD  Goal: STG: Patient will participate in at least 80% of scheduled individual psychotherapy sessions Outcome: Progressing Goal: STG: Patient will complete at least 80% of assigned homework Outcome: Progressing Goal: STG: Patient will practice problem solving skills 3 times per week for the next 4 weeks Outcome: Progressing   Problem: Depression CCP Problem  1 MDD Goal: STG: Cosette WILL PARTICIPATE IN AT LEAST 80% OF SCHEDULED INDIVIDUAL PSYCHOTHERAPY SESSIONS Outcome: Progressing Goal: STG: Kaydance WILL COMPLETE AT LEAST 80% OF ASSIGNED HOMEWORK Outcome: Progressing   Problem: Anxiety Disorder CCP Problem  1 GAD  Goal: LTG: Patient will score less than 5 on the Generalized Anxiety Disorder 7 Scale (GAD-7) Outcome: Not Progressing   Problem: Depression CCP Problem  1 MDD Goal: LTG: Quanasia WILL SCORE LESS THAN 10 ON THE PATIENT HEALTH QUESTIONNAIRE (PHQ-9) Outcome: Not Progressing

## 2022-08-27 ENCOUNTER — Other Ambulatory Visit (HOSPITAL_COMMUNITY)
Admission: RE | Admit: 2022-08-27 | Discharge: 2022-08-27 | Disposition: A | Discharge status: Home / Self Care | Payer: Medicaid Other | Source: Ambulatory Visit | Attending: Internal Medicine | Admitting: Internal Medicine

## 2022-08-27 ENCOUNTER — Ambulatory Visit (INDEPENDENT_AMBULATORY_CARE_PROVIDER_SITE_OTHER): Payer: Medicaid Other | Admitting: Student

## 2022-08-27 VITALS — BP 109/58 | HR 72 | Temp 98.4°F | Wt 310.2 lb

## 2022-08-27 DIAGNOSIS — Z794 Long term (current) use of insulin: Secondary | ICD-10-CM | POA: Diagnosis not present

## 2022-08-27 DIAGNOSIS — N898 Other specified noninflammatory disorders of vagina: Secondary | ICD-10-CM | POA: Diagnosis not present

## 2022-08-27 DIAGNOSIS — Z124 Encounter for screening for malignant neoplasm of cervix: Secondary | ICD-10-CM | POA: Insufficient documentation

## 2022-08-27 DIAGNOSIS — Z8742 Personal history of other diseases of the female genital tract: Secondary | ICD-10-CM

## 2022-08-27 DIAGNOSIS — Z0000 Encounter for general adult medical examination without abnormal findings: Secondary | ICD-10-CM

## 2022-08-27 DIAGNOSIS — E1169 Type 2 diabetes mellitus with other specified complication: Secondary | ICD-10-CM | POA: Diagnosis not present

## 2022-08-27 DIAGNOSIS — E1142 Type 2 diabetes mellitus with diabetic polyneuropathy: Secondary | ICD-10-CM

## 2022-08-27 DIAGNOSIS — N8502 Endometrial intraepithelial neoplasia [EIN]: Secondary | ICD-10-CM | POA: Diagnosis not present

## 2022-08-27 DIAGNOSIS — E119 Type 2 diabetes mellitus without complications: Secondary | ICD-10-CM

## 2022-08-27 DIAGNOSIS — R0989 Other specified symptoms and signs involving the circulatory and respiratory systems: Secondary | ICD-10-CM | POA: Diagnosis not present

## 2022-08-27 LAB — POCT GLYCOSYLATED HEMOGLOBIN (HGB A1C): Hemoglobin A1C: 7.1 % — AB (ref 4.0–5.6)

## 2022-08-27 LAB — GLUCOSE, CAPILLARY: Glucose-Capillary: 149 mg/dL — ABNORMAL HIGH (ref 70–99)

## 2022-08-27 MED ORDER — TRULICITY 3 MG/0.5ML ~~LOC~~ SOAJ: 3.0000 mg | SUBCUTANEOUS | 1 refills | Status: DC

## 2022-08-27 NOTE — Patient Instructions (Addendum)
[name], it was a pleasure seeing you today!  Today we discussed: - Diabetes: Congrats! Your A1c is 7.1%! This is great news. I would like to increase the Trulicity to '3mg'$  weekly. Otherwise, continue with your current medications. I am hoping if your numbers continue to look good we can start taking medications away soon.  - I have also referred you to gynecology. You have a small growth on the left side of your vaginal wall. We will have you follow-up with gynecology for this. I will call you with the results of your PAP smear.   - In addition, please increase your dosage of gabapentin to two tablets in the morning and three tablets in the evening. The blood vessels in your legs appear to be working well, so I do not think that is the cause of your symptoms.   I have ordered the following labs today:  Lab Orders         Glucose, capillary         POC Hbg A1C      Referrals ordered today:   Referral Orders         Ambulatory referral to Gynecology       Follow-up: 3 months   Please make sure to arrive 15 minutes prior to your next appointment. If you arrive late, you may be asked to reschedule.   We look forward to seeing you next time. Please call our clinic at [phone] if you have any questions or concerns. The best time to call is Monday-Friday from 9am-4pm, but there is someone available 24/7. If after hours or the weekend, call the main hospital number and ask for the Internal Medicine Resident On-Call. If you need medication refills, please notify your pharmacy one week in advance and they will send [place] a request.  Thank you for letting us take part in your care. Wishing you the best!  Thank you, [name], MD

## 2022-08-28 ENCOUNTER — Other Ambulatory Visit: Payer: Self-pay | Admitting: Specialist

## 2022-08-28 DIAGNOSIS — E119 Type 2 diabetes mellitus without complications: Secondary | ICD-10-CM

## 2022-08-29 DIAGNOSIS — E1140 Type 2 diabetes mellitus with diabetic neuropathy, unspecified: Secondary | ICD-10-CM | POA: Insufficient documentation

## 2022-08-29 DIAGNOSIS — Z419 Encounter for procedure for purposes other than remedying health state, unspecified: Secondary | ICD-10-CM | POA: Diagnosis not present

## 2022-08-29 DIAGNOSIS — N898 Other specified noninflammatory disorders of vagina: Secondary | ICD-10-CM | POA: Insufficient documentation

## 2022-08-29 MED ORDER — GABAPENTIN 300 MG PO CAPS: ORAL_CAPSULE | ORAL | 1 refills | Status: DC

## 2022-08-29 NOTE — Assessment & Plan Note (Signed)
[name] is returning to clinic today to discuss update on her diabetes. She did not bring in her meter today, but reports her sugars have been doing well at home, reporting mostly in 100-200's. Denies any hypoglycemic events. She has not experienced any side effects since the changes at her last visit.   A1c today markedly approved, 7.1% from 9.6%. Congratulated [name] on her success and encouraged her continue the good work. Explained that the goal will be to get her off of insulin if her sugars continue to be well-controlled. She expressed understanding. Although we have improved glycemic control today, I believe she would benefit from further weight loss from GLP-1. Therefore, we will increase dose today and have her follow-up in three months.   - Increase Trulicity '3mg'$  weekly - Continue Semglee 30 units QHS - Continue metformin '500mg'$  twice daily - Follow-up A1c in three months

## 2022-08-29 NOTE — Progress Notes (Signed)
CC: follow-up  HPI:  [name] is a 61 y.o. person with chronic heart failure with preserved ejection fraction, COPD, OSA, type II diabetes mellitus presenting to [facility] for regular follow-up  Please see problem-based list for further details, assessments, and plans.  Past Medical History:  Diagnosis Date   Anemia    Anxiety    Aortic valve stenosis, severe    Arthritis    PAIN AND SEVERE OA LEFT KNEE ; S/P RIGHT TKA ON 02/03/12; HAS LOWER BACK PAIN-UNABLE TO STAND MORE THAN 10 MIN; ARTHRITIS "ALL OVER"   Asthma    Blood transfusion    2013[place]   Breast cancer in female [facility])    Right   CAD (coronary artery disease)    Cath 2010 with DES x 1 RCA-- PT'S CARDIOLOGIST IS DR. MCALHANY   Chronic diastolic congestive heart failure (HCC)    COPD (chronic obstructive pulmonary disease) (HCC)    Depression    Diabetes mellitus DIAGNOSED IN2010   Dyspnea    with much ambulation   Eczema    on back   Headache    migraines younger- rare now 02/07/21   Heart murmur    no current problems   History of hiatal hernia    History of kidney stones    passed or blasted   Hyperlipidemia    Hypertension    Morbid obesity with body mass index of 60.0-69.9 in adult [facility])    Myocardial infarction ([place])    PT THINKS SHE WAS DX WITH MI AT THE TIME OF HEART STENTING   Neuromuscular disorder (HCC)    bilateral arm/hands   Oxygen dependent    uses 3L oxygen night/prn   Personal history of radiation therapy    Pneumonia    Pulmonary embolism ([place]) 02/08/2012   S/P RT TOTAL KNEE ON 02/03/12--ON 02/08/12--DEVELOPED ACUTE SOB AND CHEST PAIN--AND DIAGNOSED WITH  PULMONARY EMBOLUS AND PNEUMONIA   Restless leg syndrome    Sleep apnea    uses 3 liters O2 at night as needed   Uterine fibroid    NO PROBLEMS AT PRESENT FROM THE FIBROIDS-STATES SHE IS POST MENOPAUSAL-LAST MENSES 2010 EXCEPT FOR EPISODE THIS YR OF BLEEDING RELATED TO FIBROIDS.   Weakness    BOTH HANDS - S/P BILATERAL CARPAL  TUNNEL RELEASE--BUT STILL HAS WEAKNESS--OFTEN DROPS THINGS   Review of Systems:  As per HPI  Physical Exam:  Vitals:   08/27/22 1014  BP: (!) 109/58  Pulse: 72  Temp: 98.4 F (36.9 C)  TempSrc: Oral  SpO2: 94%  Weight: (!) 310 lb 3.2 oz (140.7 kg)   General: Person resting in wheelchair, no acute distress HENT: Normocephalic atraumatic, increased neck circumference. Eyes: Vision grossly in tact. No scleral icterus or conjunctival injection. CV: Regular rate, rhythm. No murmurs appreciated. Warm extremities. DP pulse 2+ on R, 1+ on L. Pulm: Normal work of breathing. Clear to auscultation bilaterally. No rales, wheezing, rhonchi. GU: Normal appearing external female genitalia. 1-2cm outgrowth appreciated on left side of vaginal wall (R wall when examining), bleeding. Cervical os appeared normal without any growths or color changes. No other lesions appreciated.   MSK: Normal bulk, tone. No peripheral pitting edema appreciated.  Skin: Warm, dry. No rashes or lesions appreciated.  Neuro: Awake, alert, conversing appropriately. Grossly non-focal. Sensation in tact bilateral lower extremities.  Psych: Normal mood, affect, speech.   Assessment & Plan:   Type 2 diabetes mellitus without complication, without long-term current use of insulin ([place]) Ms.  Buresh is returning to clinic today to discuss update on her diabetes. She did not bring in her meter today, but reports her sugars have been doing well at home, reporting mostly in 100-200's. Denies any hypoglycemic events. She has not experienced any side effects since the changes at her last visit.   A1c today markedly approved, 7.1% from 9.6%. Congratulated Ms. Strawderman on her success and encouraged her continue the good work. Explained that the goal will be to get her off of insulin if her sugars continue to be well-controlled. She expressed understanding. Although we have improved glycemic control today, I believe she would benefit from  further weight loss from GLP-1. Therefore, we will increase dose today and have her follow-up in three months.   - Increase Trulicity '3mg'$  weekly - Continue Semglee 30 units QHS - Continue metformin '500mg'$  twice daily - Follow-up A1c in three months  Diabetic neuropathy ([place]) Today, Ms. Minogue reports increased burning sensation in her feet. States this has been a long-going issue, but has recently worsened. She currently takes one tablet of gabapentin in the morning and three tablets in the evening. She does feel like she gets drowsy after taking the three. She also reports some pain in her calves while walking, often has to sit down for the pain to subside. She does not feel as though the gabapentin in the morning does much for her pain.   On exam today, Ms. Whitebread has sensation in tact in bilateral feet. I did appreciate decreased pulse in left foot. ABI's were obtained given subjective history of claudication symptoms in this setting. Thankfully, these were normal. I believe her symptoms most likely represent diabetic neuropathy in setting of long-standing, previously uncontrolled diabetes. We will increase her morning time gabapentin today. In the future, can consider switching from citalopram to duloxetine for dual anti-depressant and pain indications.   - Increase gabapentin '600mg'$  in the AM, '900mg'$  in the PM   Healthcare maintenance - PAP smear done today in clinic with assistance of Dr. Saverio Danker. 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 On exam, there was an outgrowth on the left side of the vaginal wall. We will have her follow-up with gynecology for this.   Vaginal lesion During today's routine PAP smear, Ms. Rullo was found to have a 1-2cm growth on the left side of the vaginal wall. 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 Discussed with Ms. Belford the need for biopsy and follow-up with  gynecology. She has agreed to this.  - Referral to gynecology placed for biopsy  Patient discussed with Dr.  Garnet Koyanagi, MD Internal Medicine PGY-3 Pager: [phone]

## 2022-08-29 NOTE — Assessment & Plan Note (Signed)
During today's routine PAP smear, [name] was found to have a 1-2cm growth on the left side of the vaginal wall. 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 Discussed with [name] the need for biopsy and follow-up with gynecology. She has agreed to this.  - Referral to gynecology placed for biopsy

## 2022-08-29 NOTE — Assessment & Plan Note (Signed)
Today, [name] reports increased burning sensation in her feet. States this has been a long-going issue, but has recently worsened. She currently takes one tablet of gabapentin in the morning and three tablets in the evening. She does feel like she gets drowsy after taking the three. She also reports some pain in her calves while walking, often has to sit down for the pain to subside. She does not feel as though the gabapentin in the morning does much for her pain.   On exam today, [name] has sensation in tact in bilateral feet. I did appreciate decreased pulse in left foot. ABI's were obtained given subjective history of claudication symptoms in this setting. Thankfully, these were normal. I believe her symptoms most likely represent diabetic neuropathy in setting of long-standing, previously uncontrolled diabetes. We will increase her morning time gabapentin today. In the future, can consider switching from citalopram to duloxetine for dual anti-depressant and pain indications.   - Increase gabapentin '600mg'$  in the AM, '900mg'$  in the PM

## 2022-08-29 NOTE — Assessment & Plan Note (Signed)
-   PAP smear done today in clinic with assistance of Dr. Saverio Danker. 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 On exam, there was an outgrowth on the left side of the vaginal wall. We will have her follow-up with gynecology for this.

## 2022-09-02 ENCOUNTER — Other Ambulatory Visit: Payer: Self-pay | Admitting: Adult Health

## 2022-09-02 ENCOUNTER — Other Ambulatory Visit: Payer: Self-pay | Admitting: Obstetrics and Gynecology

## 2022-09-02 DIAGNOSIS — Z1231 Encounter for screening mammogram for malignant neoplasm of breast: Secondary | ICD-10-CM

## 2022-09-02 NOTE — Progress Notes (Signed)
Internal Medicine Clinic Attending  I saw and evaluated the patient.  I personally confirmed the key portions of the history and exam documented by Dr. Collene Gobble and I reviewed pertinent patient test results.  The assessment, diagnosis, and plan were formulated together and I agree with the documentation in the resident's note. History of atypical endometrial hyperplasia, previously evaluated by gynecology but I do not see any f/u since 2018. At that time it was determined she was a non-operative candidate and had an IUD placed with prescription of Megace for heavy vaginal bleeding. Today, she notes the IUD came out with heavy bleeding and she has not taken anything like Megace in years. She denies any further vaginal bleeding in recent years. We do recommend f/u with gynecology to ensure no further evaluation/monitoring is needed, as well as to assess and likely biopsy exophytic, bleeding left vaginal wall mass seen on today's pelvic examination.

## 2022-09-02 NOTE — Patient Outreach (Signed)
  Medicaid Managed Care   Unsuccessful Attempt Note   09/02/2022 Name: [name] MRN: [id number] DOB: 1961-09-08  Referred by: [name], MD Reason for referral : High Risk Managed Medicaid (Unsuccessful telephone outreach)   An unsuccessful telephone outreach was attempted today. The patient was referred to the case management team for assistance with care management and care coordination.    Follow Up Plan: The Managed Medicaid care management team will reach out to the patient again over the next 30 business  days.    Aida Raider RN, BSN [place]  [facility] - Managed Medicaid High Risk [phone]

## 2022-09-02 NOTE — Patient Instructions (Signed)
Hi Ms. Windom, I am sorry I missed you today, I hope you are doing okay  - as a part of your Medicaid benefit, you are eligible for care management and care coordination services at no cost or copay. I was unable to reach you by phone today but would be happy to help you with your health related needs. Please feel free to call me at [phone].  A member of the Managed Medicaid care management team will reach out to you again over the next 30 business  days.

## 2022-09-03 ENCOUNTER — Other Ambulatory Visit: Payer: Medicaid Other | Admitting: Obstetrics and Gynecology

## 2022-09-03 ENCOUNTER — Other Ambulatory Visit: Payer: Self-pay | Admitting: Obstetrics and Gynecology

## 2022-09-03 LAB — CYTOLOGY - PAP
Comment: NEGATIVE
Diagnosis: NEGATIVE
High risk HPV: NEGATIVE

## 2022-09-03 NOTE — Patient Outreach (Signed)
Care Coordination  09/03/2022  [name] 10/10/1961 [id number]  RNCM returned patient's phone call-second attempt today-call goes straight to voicemail so left message.  Patient to schedule an appt with GYN, if needs referral to contact PCP office for referral.  Also, if needs DME, patient mentioned motorized chair, to contact PCP for DME order.  RNCM will continue to follow.  Aida Raider RN, BSN [place]  [facility] - Managed Medicaid High Risk [phone].

## 2022-09-03 NOTE — Patient Instructions (Signed)
HI Ms. Reitan, I am sorry to have missed you again today, I was returning your call- as a part of your Medicaid benefit, you are eligible for care management and care coordination services at no cost or copay. I was unable to reach you by phone today but would be happy to help you with your health related needs. Please feel free to call me at [phone]   A member of the Managed Medicaid care management team will reach out to you again over the next 30 business days.   Aida Raider RN, BSN [place]  [facility] - Managed Medicaid High Risk [phone].

## 2022-09-03 NOTE — Patient Outreach (Signed)
Care Coordination  09/03/2022  [name] 1961-07-05 [id number]  RNCM returned patient's phone call, no answer, left message.  Aida Raider RN, BSN [place]  [facility] - Managed Medicaid High Risk [phone].

## 2022-09-04 ENCOUNTER — Ambulatory Visit (INDEPENDENT_AMBULATORY_CARE_PROVIDER_SITE_OTHER): Payer: Medicaid Other | Admitting: Student

## 2022-09-04 DIAGNOSIS — M4316 Spondylolisthesis, lumbar region: Secondary | ICD-10-CM | POA: Diagnosis not present

## 2022-09-04 DIAGNOSIS — E1142 Type 2 diabetes mellitus with diabetic polyneuropathy: Secondary | ICD-10-CM

## 2022-09-04 NOTE — Progress Notes (Unsigned)
[name] Internal Medicine Residency Telephone Encounter Continuity Care Appointment  HPI:  This telephone encounter was created for [name] on 09/06/2022 for the following purpose/cc DME equipment.   Past Medical History:  Past Medical History:  Diagnosis Date   Anemia    Anxiety    Aortic valve stenosis, severe    Arthritis    PAIN AND SEVERE OA LEFT KNEE ; S/P RIGHT TKA ON 02/03/12; HAS LOWER BACK PAIN-UNABLE TO STAND MORE THAN 10 MIN; ARTHRITIS "ALL OVER"   Asthma    Blood transfusion    [facility]   Breast cancer in female [facility])    Right   CAD (coronary artery disease)    Cath 2010 with DES x 1 RCA-- PT'S CARDIOLOGIST IS DR. MCALHANY   Chronic diastolic congestive heart failure (HCC)    COPD (chronic obstructive pulmonary disease) (HCC)    Depression    Diabetes mellitus DIAGNOSED IN2010   Dyspnea    with much ambulation   Eczema    on back   Headache    migraines younger- rare now 02/07/21   Heart murmur    no current problems   History of hiatal hernia    History of kidney stones    passed or blasted   Hyperlipidemia    Hypertension    Morbid obesity with body mass index of 60.0-69.9 in adult [facility])    Myocardial infarction ([place])    PT THINKS SHE WAS DX WITH MI AT THE TIME OF HEART STENTING   Neuromuscular disorder (HCC)    bilateral arm/hands   Oxygen dependent    uses 3L oxygen night/prn   Personal history of radiation therapy    Pneumonia    Pulmonary embolism ([place]) 02/08/2012   S/P RT TOTAL KNEE ON 02/03/12--ON 02/08/12--DEVELOPED ACUTE SOB AND CHEST PAIN--AND DIAGNOSED WITH  PULMONARY EMBOLUS AND PNEUMONIA   Restless leg syndrome    Sleep apnea    uses 3 liters O2 at night as needed   Uterine fibroid    NO PROBLEMS AT PRESENT FROM THE FIBROIDS-STATES SHE IS POST MENOPAUSAL-LAST MENSES 2010 EXCEPT FOR EPISODE THIS YR OF BLEEDING RELATED TO FIBROIDS.   Weakness    BOTH HANDS - S/P BILATERAL CARPAL TUNNEL RELEASE--BUT STILL HAS  WEAKNESS--OFTEN DROPS THINGS     ROS:  As per HPI   Assessment / Plan / Recommendations:  Please see A&P under problem oriented charting for assessment of the patient's acute and chronic medical conditions.  As always, pt is advised that if symptoms worsen or new symptoms arise, they should go to an urgent care facility or to to ER for further evaluation.   Consent and Medical Decision Making:  Patient discussed with Dr. Dareen Piano This is a telephone encounter between [name] and [name] on 09/06/2022 for DME equipment. The visit was conducted with the patient located at home and [name] at [facility]. The patient's identity was confirmed using their DOB and current address. The patient has consented to being evaluated through a telephone encounter and understands the associated risks (an examination cannot be done and the patient may need to come in for an appointment) / benefits (allows the patient to remain at home, decreasing exposure to coronavirus). I personally spent 7 minutes on medical discussion.    Spondylolisthesis, lumbar region [name] has been using a wheelchair when she leaves the house as it is difficult with spondylolisthesis, class III obesity. Currently they have a manual wheelchair,  but this is difficult to use as she cannot push herself and her husband is unable to. Today she is requesting an electric wheelchair in order for her to have mobility. In addition, she is requesting a lift chair, as it can be quite difficult for her to get up from a seated position.   In my medical opinion both of these pieces of equipment are appropriate and medically necessary for [name] in order for her to have mobility and get up from a seated position.  - DME order for electric scooter - DME order for lift chair  [name] Dame, MD Internal Medicine PGY-3 Pager: [phone]

## 2022-09-05 ENCOUNTER — Other Ambulatory Visit: Payer: Self-pay | Admitting: Licensed Clinical Social Worker

## 2022-09-05 NOTE — Patient Instructions (Signed)
Visit Information  [name] was given information about Medicaid Managed Care team care coordination services as a part of their [facility] Medicaid benefit. [name] verbally consented to engagement with the [facility] Managed Care team.   If you are experiencing a medical emergency, please call 911 or report to your local emergency department or urgent care.   If you have a non-emergency medical problem during routine business hours, please contact your provider's office and ask to speak with a nurse.   For questions related to your [facility] health plan, please call: [phone] or go here:[email or web address]  If you would like to schedule transportation through your [facility] plan, please call the following number at least 2 days in advance of your appointment: [phone].  You can also use the MTM portal or MTM mobile app to manage your rides. For the portal, please go to [email or web address].  Call the [place] at [phone], at any time, 24 hours a day, 7 days a week. If you are in danger or need immediate medical attention call 911.  If you would like help to quit smoking, call 1-800-QUIT-NOW (743) 124-2352) OR Espaol: 1-855-Djelo-Ya (0-258-527-7824) o para ms informacin haga clic aqu or Text READY to 200-400 to register via text   Following is a copy of your plan of care:  Care Plan : LCSW Plan of Care  Updates made by Greg Cutter, LCSW since 09/05/2022 12:00 AM     Problem: Depression Identification (Depression)      Long-Range Goal: Depressive Symptoms Identified   Start Date: 04/17/2022  Recent Progress: On track  Note:   Long-Range Goal: I want to start mental health treatment    Start Date: 04/17/2022  Priority: High  Note:   Priority: High   Timeframe:  Long-Range Goal Priority:  High Start Date:   04/17/22                Expected End Date:  ongoing                 Follow Up Date--08/19/22 at 11 am     - check out counseling - keep 90 percent of counseling appointments - schedule counseling appointment    Why is this important?             Beating depression may take some time.            If you don't feel better right away, don't give up on your treatment plan.     Current barriers:   Chronic Mental Health needs related to depression, stress and anxiety. Patient requires Support, Education, Resources, Referrals, Advocacy, and Care Coordination, in order to meet Unmet Mental Health Needs. Patient will implement clinical interventions discussed today to decrease symptoms of depression and increase knowledge and/or ability of: coping skills. Mental Health Concerns and Social Isolation Patient lacks knowledge of available community counseling agencies and resources.   Clinical Goal(s): verbalize understanding of plan for management of Anxiety, Depression, and Stress and demonstrate a reduction in symptoms. Patient will connect with a provider for ongoing mental health treatment, increase coping skills, healthy habits, self-management skills, and stress reduction       Patient Goals/Self-Care Activities: Over the next 120 days Attend scheduled medical appointments Utilize healthy coping skills and supportive resources discussed Contact PCP with any questions or concerns Keep 90 percent of counseling appointments Call your insurance provider for more information about your Enhanced Benefits  Check out counseling resources provided  Begin  personal counseling with LCSW, to reduce and manage symptoms of Depression and Stress, until well-established with mental health provider Accept all calls from representative with [facility] in an effort to establish ongoing mental health counseling and supportive services. Incorporate into daily practice - relaxation techniques, deep breathing exercises, and mindfulness meditation strategies. Talk about feelings with friends, family members, spiritual advisor,  etc. Contact LCSW directly [phone]), if you have questions, need assistance, or if additional social work needs are identified between now and our next scheduled telephone outreach call. Call [phone] for mental health hotline/crisis line if needed (24/7 available) Try techniques to reduce symptoms of anxiety/negative thinking (deep breathing, distraction, positive self talk, etc)  - develop a personal safety plan - develop a plan to deal with triggers like holidays, anniversaries - exercise at least 2 to 3 times per week - have a plan for how to handle bad days - journal feelings and what helps to feel better or worse - spend time or talk with others at least 2 to 3 times per week - watch for early signs of feeling worse - begin personal counseling - call and visit an old friend - check out volunteer opportunities - join a support group - laugh; watch a funny movie or comedian - learn and use visualization or guided imagery - perform a random act of kindness - practice relaxation or meditation daily - start or continue a personal journal - practice positive thinking and self-talk -continue with compliance of taking medication        05/21/2022   12:53 PM 04/17/2022    3:17 PM 11/14/2021    4:02 PM 08/21/2021    4:33 PM 12/10/2020    4:00 PM  Depression screen PHQ 2/9  Decreased Interest '2 2 1 '$ 0 1  Down, Depressed, Hopeless '2 2 1 '$ 0 0  PHQ - 2 Score '4 4 2 '$ 0 1  Altered sleeping '3 3 3   1  '$ Tired, decreased energy '3 3 1   1  '$ Change in appetite '3 3 1   1  '$ Feeling bad or failure about yourself  0 1 0   0  Trouble concentrating 0 0 0   0  Moving slowly or fidgety/restless 0   0   0  Suicidal thoughts 0 0 0   0  PHQ-9 Score '13 14 7   4  '$ Difficult doing work/chores Somewhat difficult Somewhat difficult Not difficult at all   Not difficult at all        24- Hour Availability:    [facility]  [address] [place], [place] [facility] [place] [facility]  [phone]   [facility] [employer] [phone]   [place]  [phone]    [place]  [phone] (after hours)   Therapeutic Alternative/Mobile Crisis   [phone]   [place] National Suicide Hotline  7656110854 (TALK) OR 988   Call 911 or go to emergency room   [facility]  [phone]);  [place] and [employer]  [phone]); [place], [place], [place], [place], [place], [place], [place]

## 2022-09-05 NOTE — Patient Outreach (Signed)
Medicaid Managed Care Social Work Note  09/05/2022 Name:  [name] MRN:  [id number] DOB:  1961-11-08  [name] is an 61 y.o. year old female who is a primary patient of [name], MD.  The [place] team was consulted for assistance with:  [place] and Resources  Ms. Starke was given information about Medicaid Managed Care Coordination team services today. [name] Patient agreed to services and verbal consent obtained.  Engaged with patient  for by telephone forfollow up visit in response to referral for case management and/or care coordination services.   Assessments/Interventions:  Review of past medical history, allergies, medications, health status, including review of consultants reports, laboratory and other test data, was performed as part of comprehensive evaluation and provision of chronic care management services.  SDOH: (Social Determinant of Health) assessments and interventions performed: SDOH Interventions    Flowsheet Row Patient Outreach Telephone from 09/05/2022 in [place] Patient Outreach Telephone from 08/01/2022 in [place] Patient Outreach Telephone from 06/12/2022 in [facility] [facility] Office Visit from 06/04/2022 in [place] Patient Outreach Telephone from 05/21/2022 in [place] Patient Outreach Telephone from 05/19/2022 in [place] Interventions        Food Insecurity Interventions -- -- -- -- -- Other (Comment)  [patient provided resources]  Housing Interventions -- -- -- -- -- Intervention Not Indicated  Depression Interventions/Treatment  Currently on Treatment, Counseling, Medication -- -- Medication, Counseling Medication, Counseling --  Stress  Interventions Offered [employer], Provide Counseling Live Life Well, [place] Resources, Provide Counseling -- [place], Provide Counseling --       Advanced Directives Status:  See Care Plan for related entries.  Care Plan                 No Known Allergies  Medications Reviewed Today     Reviewed by Salley Slaughter, NP (Nurse Practitioner) on 08/12/22 at 15  Med List Status: <None>   Medication Order Taking? Sig Documenting Provider Last Dose Status Informant  Accu-Chek FastClix Lancets MISC [id number] No Check blood sugar two times a day Lorella Nimrod, MD Taking Active Self  albuterol (PROVENTIL) (2.5 MG/3ML) 0.083% nebulizer solution [id number]  USE 1 VIAL IN NEBULIZER EVERY 4 HOURS AS NEEDED FOR WHEEZING FOR SHORTNESS OF Loyal Gambler, MD  Active   amlodipine-atorvastatin (CADUET) 5-80 MG tablet [id number] No Take 1 tablet by mouth daily. [name], MD 03/06/2022 0730 Active Self  Ascorbic Acid (VITAMIN C) 1000 MG tablet [id number] No Take 1,000 mg by mouth daily. [provider] Past Month Active Self  ASPIRIN ADULT LOW STRENGTH 81 MG EC tablet [id number] No TAKE 1 Tablet BY MOUTH ONCE DAILY Lorella Nimrod, MD 02/27/2022 Active Self  Blood Glucose Monitoring Suppl (ACCU-CHEK GUIDE) w/Device KIT [id number] No 1 each by Does not apply route 2 (two) times daily. Lorella Nimrod, MD Taking Active Self  citalopram (CELEXA) 20 MG tablet [id number]  TAKE 1 TABLET BY MOUTH ONCE DAILY. DO NOT TAKE WHILE TAKING DULOXETINE [name], MD  Active   clopidogrel (PLAVIX) 75 MG tablet [id number] No TAKE 1 TABLET BY MOUTH IN THE [name], MD 02/27/2022 Active Self  Continuous Blood Gluc Sensor (FREESTYLE LIBRE 3 SENSOR) MISC [id number]  Place 1 sensor on the skin  every 14 days. Use to check glucose continuously [name], MD  Active   docusate sodium  (COLACE) 100 MG capsule [id number] No Take 1 capsule (100 mg total) by mouth 2 (two) times daily.  Patient not taking: Reported on 04/14/2022   [name], MD Not Taking Active Self  Dulaglutide (TRULICITY) 1.5 WE/9.9BZ SOPN [id number]  Inject 1.5 mg into the skin once a week. [name], MD  Active   Evolocumab [facility] SURECLICK) 101 MG/ML [name] [id number]  INJECT 1 PEN INTO THE SKIN EVERY 14 DAYS Burnell Blanks, MD  Active   exemestane (AROMASIN) 25 MG tablet [id number] No Take 1 tablet (25 mg total) by mouth daily after breakfast. [name], MD 03/06/2022 0730 Active Self  ezetimibe (ZETIA) 10 MG tablet [id number] No Take 1 tablet (10 mg total) by mouth daily. [name], PA-C 03/06/2022 0730 Expired 05/26/22 2359   ferrous sulfate 325 (65 FE) MG tablet [id number]  Take 1 tablet (325 mg total) by mouth every other day. [name], MD  Active   fexofenadine [facility]) 180 MG tablet [id number]  Take 1 tablet (180 mg total) by mouth daily. [name], PA-C  Active   fluticasone (FLONASE) 50 MCG/ACT nasal spray [id number]  Place 1 spray into both nostrils daily. Begin by using 2 sprays in each nare daily for 3 to 5 days, then decrease to 1 spray in each nare daily. [name], PA-C  Active   Discontinued 05/09/20 1429   Fluticasone-Umeclidin-Vilant (TRELEGY ELLIPTA) 100-62.5-25 MCG/ACT AEPB [id number] No INHALE 1 PUFF ONCE DAILY Strength: 100-62.5-25 MCG/ACT Lacinda Axon, MD 03/06/2022 0730 Active Self  furosemide (LASIX) 40 MG tablet [id number] No Take 1 tablet (40 mg total) by mouth as needed for fluid or edema. [name], MD Past Week Active Self  gabapentin (NEURONTIN) 300 MG capsule [id number]  TAKE 1 CAPSULE BY MOUTH IN THE MORNING AND 3 AT BEDTIME [name], MD  Active   glucose blood (ACCU-CHEK GUIDE) test strip [id number] No Check blood sugar 2 times per day [name], MD Taking Active Self  insulin glargine-yfgn (SEMGLEE) 100  UNIT/ML Pen [id number]  Inject 30 Units into the skin at bedtime. [name], MD  Active   Insulin Pen Needle 30G X 5 MM MISC [id number] No 1 application by Does not apply route daily. [name], MD Taking Active Self  losartan-hydrochlorothiazide [facility]) 100-25 MG tablet [id number] No Take 1 tablet by mouth daily. [name], MD 03/05/2022 Active Self  metFORMIN (GLUCOPHAGE-XR) 500 MG 24 hr tablet [id number]  Take 1 tablet (500 mg total) by mouth 2 (two) times daily. [name], MD  Active   methocarbamol (ROBAXIN) 500 MG tablet [id number]  Take 1 tablet (500 mg total) by mouth every 6 (six) hours as needed for muscle spasms. [name], MD  Active   metoprolol succinate (TOPROL XL) 50 MG 24 hr tablet [id number] No Take 1 tablet (50 mg total) by mouth daily. Take with or immediately following a meal. [name], MD 03/06/2022 0730 Active Self  montelukast (SINGULAIR) 10 MG tablet [id number]  Take 1 tablet (10 mg total) by mouth at bedtime. [name], MD  Active   Potassium 99 MG TABS [id number] No Take 99 mg by mouth daily. [provider] Past Month Active Self  senna-docusate (SENNA-TIME S) 8.6-50 MG tablet [id number]  Take 1 tablet by mouth at bedtime as needed for mild constipation. [name], MD  Active   traMADol (ULTRAM) 50 MG tablet  [id number]  Take 1 tablet (50 mg total) by mouth every 6 (six) hours as needed. Hazel Sams, PA-C  Active   VENTOLIN HFA 108 (90 Base) MCG/ACT inhaler [id number]  INHALE 2 PUFFS BY MOUTH EVERY 6 HOURS AS NEEDED FOR WHEEZING OR SHORTNESS OF Loyal Gambler, MD  Active             Patient Active Problem List   Diagnosis Date Noted   Diabetic neuropathy ([place]) 08/29/2022   Vaginal lesion 08/29/2022   PTSD (post-traumatic stress disorder) 06/24/2022   GAD (generalized anxiety disorder) 06/24/2022   Major depressive disorder, recurrent episode, moderate ([place]) 06/24/2022   Urinary incontinence  06/20/2022   Intertrigo 06/20/2022   Arthritis of left shoulder region    S/P reverse total shoulder arthroplasty, 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   Healthcare maintenance 11/17/2021   Spondylolisthesis, lumbar region 02/11/2021    Class: Acute   Malignant neoplasm of upper-outer quadrant of right breast in female, estrogen receptor positive ([place]) 06/10/2018   Restless leg syndrome 02/04/2018   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   Depression 08/06/2015   Hypertension associated with diabetes ([place])    Aortic stenosis    Chronic diastolic congestive heart failure ([place])    COPD (chronic obstructive pulmonary disease) ([place]) 09/21/2013   Obstructive sleep apnea 09/21/2013   Nocturnal hypoxemia 09/12/2013   Hyperlipidemia 11/30/2012   History of pulmonary embolus ([place]) 07/09/2012   Normocytic anemia 02/08/2012   Type 2 diabetes mellitus without complication, without long-term current use of insulin ([place]) 02/08/2012   Asthma 02/08/2012   GERD (gastroesophageal reflux disease)    CAD (coronary artery disease) 12/08/2011    Conditions to be addressed/monitored per PCP order:  Depression  Care Plan : LCSW Plan of Care  Updates made by Greg Cutter, LCSW since 09/05/2022 12:00 AM     Problem: Depression Identification (Depression)      Long-Range Goal: Depressive Symptoms Identified   Start Date: 04/17/2022  Recent Progress: On track  Note:   Long-Range Goal: I want to start mental health treatment    Start Date: 04/17/2022  Priority: High  Note:   Priority: High   Timeframe:  Long-Range Goal Priority:  High Start Date:   04/17/22                Expected End Date:  ongoing                 Follow Up Date--09/19/22 at 11 am    - check out counseling - keep 90 percent of counseling appointments - schedule counseling appointment    Why is this important?             Beating depression may take some time.            If you don't feel better right away, don't give up on your  treatment plan.     Current barriers:   Chronic Mental Health needs related to depression, stress and anxiety. Patient requires Support, Education, Resources, Referrals, Advocacy, and Care Coordination, in order to meet Unmet Mental Health Needs. Patient will implement clinical interventions discussed today to decrease symptoms of depression and increase knowledge and/or ability of: coping skills. Mental Health Concerns and Social Isolation Patient lacks knowledge of available community counseling agencies and resources.   Clinical Goal(s): verbalize understanding of plan for management of Anxiety, Depression, and Stress and demonstrate a reduction in symptoms. Patient will connect with a provider for ongoing mental health treatment, increase coping skills,  healthy habits, self-management skills, and stress reduction        Clinical Interventions:  Assessed patient's previous and current treatment, coping skills, support system and barriers to care. Patient provided history. Patient and spouse lived in a shared home with two other adults. Patient reports that this is a current and stable living situation.  Verbalization of feelings encouraged, motivational interviewing employed Emotional support provided, positive coping strategies explored Self care/establishing healthy boundaries emphasized Patient reports that she takes medication for anxiety and depression. She reports that this has alleviated some of symptoms but not all. She reports that she still has crying spells. She needs help with finding psychiatry and counseling. She was successful in identifying triggers to anxiety and depression symptoms, in addition, to healthy coping skills.  Patient reports significant worsening anxiety and depression impacting her ability to function appropriately and carry out daily task. Patient receives strong support from spouse but he does not have anxiety or depression and is unable to relate to her and her  daily mental health struggles.  Patient is agreeable to referral to [name] for counseling and psychiatry. [facility] LCSW made referral on 04/17/22. Patient will call [facility] tomorrow on 04/18/22 to schedule counseling and psychiatry appointments. Email sent to her with [facility]'s contact information.  LCSW provided education on relaxation techniques such as meditation, deep breathing, massage, grounding exercises or yoga that can activate the body's relaxation response and ease symptoms of stress and anxiety. LCSW ask that when pt is struggling with difficult emotions and racing thoughts that they start this relaxation response process. LCSW provided extensive education on healthy coping skills for anxiety. SW used active and reflective listening, validated patient's feelings/concerns, and provided emotional support. Patient will work on implementing appropriate self-care habits into their daily routine such as: staying positive, writing a gratitude list, drinking water, staying active around the house, taking their medications as prescribed, combating negative thoughts or emotions and staying connected with their family and friends. Positive reinforcement provided for this decision to work on this. Patient was receptive to anxiety and depression management coping skill education. She will try to increase her socialization over the next 90 days to decrease isolation.  Patient has problems with sleep disturbance. LCSW provided education on healthy sleep hygiene and what that looks like. LCSW encouraged patient to implement a night time routine into her schedule that works best for her and that she is able to maintain. Advised patient to implement deep breathing/grounding/meditation/self-care exercises into her nightly routine to combat racing thoughts at night. LCSW encouraged patient to wake up at the same time each day, make their sleeping environment comfortable, exercise when able, to limit naps and to not eat or drink  anything right before bed.   Motivational Interviewing employed Depression screen reviewed  PHQ2/ PHQ9 completed Mindfulness or Relaxation training provided Active listening / Reflection utilized  Advance Care and HCPOA education provided Emotional Support Provided Problem [place] strategies reviewed Provided psychoeducation for mental health needs  Provided brief CBT  Reviewed mental health medications and discussed importance of compliance:  Quality of sleep assessed & Sleep Hygiene techniques promoted  Participation in counseling encouraged  Verbalization of feelings encouraged  Suicidal Ideation/Homicidal Ideation assessed: Patient denies SI/HI  Review resources, discussed options and provided patient information about  [place] care team collaboration (see longitudinal plan of care) UPDATE 05/21/22- Patient was successfully set up with counseling at [facility] but was not set up with psychiatry. She is in need of both  services. Patient has an upcoming counseling session at [facility] on 06/24/22. [facility] LCSW completed joint call with patient to [facility] but was unable to reach anyone. [facility] LCSW left a voice message asking for them to return call to patient to get her scheduled with psychiatry as [facility] LCSW ask for both psychiatry and counseling in referral. [facility] LCSW ask if they needed an additional referral to please let [facility] LCSW know so this can be completed. [facility] LCSW sent patient an email with [facility]'s contact information including their address as well as a list of coping skills for depression and anxiety. Patient's spouse has multiple health conditions and tells patient that he "wishes to die" often which concerns her. [facility] LCSW provided emotional support and brief coping skill education on stress management.  UPDATE 06/12/22- Patient successfully contacted [facility] and set up appointments for both psychiatry and counseling. Patient is eager to start treatment. Brief  self-care education provided to patient. Patient is agreeable to contact [facility] LCSW directly if needed. [facility] LCSW will make one last follow up on call to ensure that patient was successfully connected to a long term mental health provider. Patient denies any current crises or concerns.  UPDATE 07/18/22- Patient has not been set up with counseling at [facility] and she has completed her first counseling session but she has not been set up with psychiatry still. She has made several calls to agency but has been unsuccessful in reaching them. Patient has an in person appointment at [facility] on 07/30/22 and she will ask them to enroll her in psychiatry at that time. [facility] LCSW will follow up and continue to support patient as needed. UPDATE 08/01/22- Patient reports that she is doing extremely well and that her mood has lifted over the last few weeks. She reports that her first counselor appointment well very well and she was able to build rapport with him. She will see her counselor 2X per month. Her next appointment for therapy is on 08/20/22. Patient has her initial appointment with psychiatry is on the 08/12/22. UPDATE- Patient successfully completed her psychiatry appointment and has a follow up scheduled in two months. However, patient reports having ongoing issues with sleep even with the medication and reports needing this medication to be adjusted. Pt will discuss this concern with her therapist on 09/09/22 but was encouraged to call [facility] before then if needed to address this issue. [facility] LCSW will follow up in two weeks.  Patient Goals/Self-Care Activities: Over the next 120 days Attend scheduled medical appointments Utilize healthy coping skills and supportive resources discussed Contact PCP with any questions or concerns Keep 90 percent of counseling appointments Call your insurance provider for more information about your Enhanced Benefits  Check out counseling resources provided  Begin personal counseling with LCSW,  to reduce and manage symptoms of Depression and Stress, until well-established with mental health provider Accept all calls from representative with [facility] in an effort to establish ongoing mental health counseling and supportive services. Incorporate into daily practice - relaxation techniques, deep breathing exercises, and mindfulness meditation strategies. Talk about feelings with friends, family members, spiritual advisor, etc. Contact LCSW directly [phone]), if you have questions, need assistance, or if additional social work needs are identified between now and our next scheduled telephone outreach call. Call 988 for mental health hotline/crisis line if needed (24/7 available) Try techniques to reduce symptoms of anxiety/negative thinking (deep breathing, distraction, positive self talk, etc)  - develop a personal safety plan - develop a plan to deal  with triggers like holidays, anniversaries - exercise at least 2 to 3 times per week - have a plan for how to handle bad days - journal feelings and what helps to feel better or worse - spend time or talk with others at least 2 to 3 times per week - watch for early signs of feeling worse - begin personal counseling - call and visit an old friend - check out volunteer opportunities - join a support group - laugh; watch a funny movie or comedian - learn and use visualization or guided imagery - perform a random act of kindness - practice relaxation or meditation daily - start or continue a personal journal - practice positive thinking and self-talk -continue with compliance of taking medication        05/21/2022   12:53 PM 04/17/2022    3:17 PM 11/14/2021    4:02 PM 08/21/2021    4:33 PM 12/10/2020    4:00 PM  Depression screen PHQ 2/9  Decreased Interest '2 2 1 ' 0 1  Down, Depressed, Hopeless '2 2 1 ' 0 0  PHQ - 2 Score '4 4 2 ' 0 1  Altered sleeping '3 3 3   1  ' Tired, decreased energy '3 3 1   1  ' Change in appetite '3 3 1   1   ' Feeling bad or failure about yourself  0 1 0   0  Trouble concentrating 0 0 0   0  Moving slowly or fidgety/restless 0   0   0  Suicidal thoughts 0 0 0   0  PHQ-9 Score '13 14 7   4  ' Difficult doing work/chores Somewhat difficult Somewhat difficult Not difficult at all   Not difficult at all        24- Hour Availability:    [facility]  [address] [place], [place] Crisis [phone]   [facility] [employer] [phone]   [place]  [phone]    [place]  [phone] (after hours)   [facility] Alternative/Mobile Crisis   [phone]   [place] National Suicide Hotline  613 184 4753 (TALK) OR 988   Call 911 or go to emergency room   [facility]  [phone]);  [place] and [employer]  [phone]); [place], [place], [place], [place], [place], [place], [place]           Follow up:  Patient agrees to Care Plan and Follow-up.  Plan: The Managed Medicaid care management team will reach out to the patient again over the next 30 days.  Date/time of next scheduled Social Work care management/care coordination outreach:  09/19/22 at 11 am [name], BSW, MSW, [place] Medicaid LCSW [email or web address] Phone: [phone]

## 2022-09-06 NOTE — Assessment & Plan Note (Signed)
[name] has been using a wheelchair when she leaves the house as it is difficult with spondylolisthesis, class III obesity. Currently they have a manual wheelchair, but this is difficult to use as she cannot push herself and her husband is unable to. Today she is requesting an electric wheelchair in order for her to have mobility. In addition, she is requesting a lift chair, as it can be quite difficult for her to get up from a seated position.   In my medical opinion both of these pieces of equipment are appropriate and medically necessary for [name] in order for her to have mobility and get up from a seated position.  - DME order for electric scooter - DME order for lift chair

## 2022-09-08 ENCOUNTER — Telehealth: Payer: Self-pay

## 2022-09-08 NOTE — Progress Notes (Signed)
Internal Medicine Clinic Attending ? ?Case discussed with Dr. Braswell  At the time of the visit.  We reviewed the resident?s history and exam and pertinent patient test results.  I agree with the assessment, diagnosis, and plan of care documented in the resident?s note.  ?

## 2022-09-08 NOTE — Telephone Encounter (Signed)
Patient had tele appt on 9/7 and need for these items was discussed at that time. Mamie notified Danielle at [employer] this AM that orders are in Epic. Patient notified.

## 2022-09-08 NOTE — Telephone Encounter (Signed)
Please call pt back for lift chair and scooter.

## 2022-09-08 NOTE — Telephone Encounter (Signed)
Myriam Jacobson, Mamie C, [place]; Stephannie Peters; [place], Leory Plowman; [place], [place]; [place], [place]; 1 other  I'll will get this over to the team! Thank you!

## 2022-09-08 NOTE — Telephone Encounter (Signed)
Returned call to patient. States she spoke with [facility] and they will pay for these items. Transferred back to FO to schedule appt to discuss need.

## 2022-09-09 ENCOUNTER — Ambulatory Visit (INDEPENDENT_AMBULATORY_CARE_PROVIDER_SITE_OTHER): Payer: Medicaid Other | Admitting: Licensed Clinical Social Worker

## 2022-09-09 ENCOUNTER — Encounter (HOSPITAL_COMMUNITY): Payer: Self-pay

## 2022-09-09 ENCOUNTER — Other Ambulatory Visit: Payer: Self-pay | Admitting: Internal Medicine

## 2022-09-09 DIAGNOSIS — F4310 Post-traumatic stress disorder, unspecified: Secondary | ICD-10-CM | POA: Diagnosis not present

## 2022-09-09 DIAGNOSIS — F331 Major depressive disorder, recurrent, moderate: Secondary | ICD-10-CM

## 2022-09-09 DIAGNOSIS — F411 Generalized anxiety disorder: Secondary | ICD-10-CM

## 2022-09-09 NOTE — Progress Notes (Signed)
   THERAPIST PROGRESS NOTE  Virtual Visit via Video Note  I connected with [name] on 09/09/22 at 11:00 AM EDT by a video enabled telemedicine application and verified that I am speaking with the correct person using two identifiers.  Location: Patient: [name]  Provider: Provider Home    I discussed the limitations of evaluation and management by telemedicine and the availability of in person appointments. The patient expressed understanding and agreed to proceed.     I discussed the assessment and treatment plan with the patient. The patient was provided an opportunity to ask questions and all were answered. The patient agreed with the plan and demonstrated an understanding of the instructions.   The patient was advised to call back or seek an in-person evaluation if the symptoms worsen or if the condition fails to improve as anticipated.  I provided 20 minutes of non-face-to-face time during this encounter.   Dory Horn, LCSW   Participation Level: Active  Behavioral Response: CasualAlertAnxious and Depressed  Type of Therapy: Individual Therapy  Treatment Goals addressed: decrease GAD-7 and PHQ-9 below a 5 and a 10  ProgressTowards Goals: Progressing  Interventions: CBT, Motivational Interviewing, and Supportive  Summary: [name] is a 61 y.o. female who presents with depressed and anxious mood\affect.  Patient was pleasant, cooperative, maintained good eye contact.  She engaged well in therapy session was dressed casually.  LCSW had to call patient for in person session as patient was not in the lobby by 11:10 AM for 11:00 appointment.  Patient reported when LCSW called that she attempted to leave a voicemail on the nursing line to cancel appointment due to feeling ill.  LCSW offered patient to do session virtually and patient was agreeable.  Patient reports primary stressors as illness.  Patient reports she has taken multiple COVID test  and they have tested negative.  Patient reports that she has a bad cold and has a frequent dry cough.  Patient currently reports insomnia and some problems with medication management.  LCSW utilized solution focused therapy to provide patient with a number to the nursing line for all medication management questions.  Suicidal/Homicidal: Nowithout intent/plan  Therapist Response:    Ineterventions/Plan: LCSW administered the GAD-7.  LCSW administered the PHQ-9.  LCSW reviewed scores with patient.  LCSW notices a decrease below a 5 for both GAD-7 and PHQ-9.  LCSW utilized supportive therapy for praise and encouragement.  LCSW used psychoanalytic therapy.  Patient to express thoughts, feelings and emotions.  LCSW educated patient on open-ended communication.   Plan: Return again in 3 weeks.  Diagnosis: PTSD (post-traumatic stress disorder)  GAD (generalized anxiety disorder)  Major depressive disorder, recurrent episode, moderate ([place])  Collaboration of Care: Other None today.   Patient/Guardian was advised Release of Information must be obtained prior to any record release in order to collaborate their care with an outside provider. Patient/Guardian was advised if they have not already done so to contact the registration department to sign all necessary forms in order for [place] to release information regarding their care.   Consent: Patient/Guardian gives verbal consent for treatment and assignment of benefits for services provided during this visit. Patient/Guardian expressed understanding and agreed to proceed.   Dory Horn, LCSW 09/09/2022

## 2022-09-09 NOTE — Plan of Care (Signed)
  Problem: Anxiety Disorder CCP Problem  1 GAD  Goal: LTG: Patient will score less than 5 on the Generalized Anxiety Disorder 7 Scale (GAD-7) Outcome: Progressing Goal: STG: Patient will participate in at least 80% of scheduled individual psychotherapy sessions Outcome: Progressing Goal: STG: Patient will complete at least 80% of assigned homework Outcome: Progressing Goal: STG: Patient will practice problem solving skills 3 times per week for the next 4 weeks Outcome: Progressing   Problem: Depression CCP Problem  1 MDD Goal: LTG: [name] WILL SCORE LESS THAN 10 ON THE PATIENT HEALTH QUESTIONNAIRE (PHQ-9) Description: Reviewed intervention  Outcome: Progressing Note: Reviewed intervention  Goal: STG: [name] WILL PARTICIPATE IN AT LEAST 80% OF SCHEDULED INDIVIDUAL PSYCHOTHERAPY SESSIONS Outcome: Progressing Goal: STG: [name] WILL COMPLETE AT LEAST 80% OF ASSIGNED HOMEWORK Outcome: Progressing

## 2022-09-11 ENCOUNTER — Other Ambulatory Visit: Payer: Self-pay

## 2022-09-11 ENCOUNTER — Other Ambulatory Visit: Payer: Self-pay | Admitting: *Deleted

## 2022-09-11 ENCOUNTER — Inpatient Hospital Stay: Payer: Medicaid Other | Attending: Hematology and Oncology

## 2022-09-11 DIAGNOSIS — C50411 Malignant neoplasm of upper-outer quadrant of right female breast: Secondary | ICD-10-CM

## 2022-09-11 DIAGNOSIS — Z87891 Personal history of nicotine dependence: Secondary | ICD-10-CM | POA: Diagnosis not present

## 2022-09-11 DIAGNOSIS — Z170 Estrogen receptor positive status [ER+]: Secondary | ICD-10-CM | POA: Insufficient documentation

## 2022-09-11 DIAGNOSIS — Z79811 Long term (current) use of aromatase inhibitors: Secondary | ICD-10-CM | POA: Insufficient documentation

## 2022-09-11 DIAGNOSIS — Z7982 Long term (current) use of aspirin: Secondary | ICD-10-CM | POA: Insufficient documentation

## 2022-09-11 DIAGNOSIS — Z923 Personal history of irradiation: Secondary | ICD-10-CM | POA: Diagnosis not present

## 2022-09-11 DIAGNOSIS — D649 Anemia, unspecified: Secondary | ICD-10-CM

## 2022-09-11 DIAGNOSIS — Z79899 Other long term (current) drug therapy: Secondary | ICD-10-CM | POA: Diagnosis not present

## 2022-09-11 LAB — CMP (CANCER CENTER ONLY)
ALT: 9 U/L (ref 0–44)
AST: 12 U/L — ABNORMAL LOW (ref 15–41)
Albumin: 4.1 g/dL (ref 3.5–5.0)
Alkaline Phosphatase: 65 U/L (ref 38–126)
Anion gap: 7 (ref 5–15)
BUN: 15 mg/dL (ref 8–23)
CO2: 33 mmol/L — ABNORMAL HIGH (ref 22–32)
Calcium: 9.7 mg/dL (ref 8.9–10.3)
Chloride: 98 mmol/L (ref 98–111)
Creatinine: 0.87 mg/dL (ref 0.44–1.00)
GFR, Estimated: >60 mL/min (ref >60)
Glucose, Bld: 214 mg/dL — ABNORMAL HIGH (ref 70–99)
Potassium: 3.6 mmol/L (ref 3.5–5.1)
Sodium: 138 mmol/L (ref 135–145)
Total Bilirubin: 1.1 mg/dL (ref 0.3–1.2)
Total Protein: 6.8 g/dL (ref 6.5–8.1)

## 2022-09-11 LAB — CBC WITH DIFFERENTIAL (CANCER CENTER ONLY)
Abs Immature Granulocytes: 0.04 10*3/uL (ref 0.00–0.07)
Basophils Absolute: 0 10*3/uL (ref 0.0–0.1)
Basophils Relative: 1 %
Eosinophils Absolute: 0.2 10*3/uL (ref 0.0–0.5)
Eosinophils Relative: 3 %
HCT: 38.3 % (ref 36.0–46.0)
Hemoglobin: 12.4 g/dL (ref 12.0–15.0)
Immature Granulocytes: 1 %
Lymphocytes Relative: 17 %
Lymphs Abs: 1.2 10*3/uL (ref 0.7–4.0)
MCH: 28.2 pg (ref 26.0–34.0)
MCHC: 32.4 g/dL (ref 30.0–36.0)
MCV: 87 fL (ref 80.0–100.0)
Monocytes Absolute: 0.4 10*3/uL (ref 0.1–1.0)
Monocytes Relative: 6 %
Neutro Abs: 5 10*3/uL (ref 1.7–7.7)
Neutrophils Relative %: 72 %
Platelet Count: 211 10*3/uL (ref 150–400)
RBC: 4.4 MIL/uL (ref 3.87–5.11)
RDW: 13.6 % (ref 11.5–15.5)
WBC Count: 6.9 10*3/uL (ref 4.0–10.5)
nRBC: 0 % (ref 0.0–0.2)

## 2022-09-11 LAB — FERRITIN: Ferritin: 16 ng/mL (ref 11–307)

## 2022-09-12 ENCOUNTER — Inpatient Hospital Stay (HOSPITAL_BASED_OUTPATIENT_CLINIC_OR_DEPARTMENT_OTHER): Payer: Medicaid Other | Admitting: Hematology and Oncology

## 2022-09-12 ENCOUNTER — Encounter: Payer: Self-pay | Admitting: Hematology and Oncology

## 2022-09-12 ENCOUNTER — Other Ambulatory Visit: Payer: Self-pay | Admitting: *Deleted

## 2022-09-12 ENCOUNTER — Other Ambulatory Visit: Payer: Self-pay

## 2022-09-12 ENCOUNTER — Inpatient Hospital Stay: Payer: Medicaid Other

## 2022-09-12 VITALS — BP 133/69 | HR 80 | Temp 97.0°F | Resp 17 | Wt 307.1 lb

## 2022-09-12 DIAGNOSIS — Z79811 Long term (current) use of aromatase inhibitors: Secondary | ICD-10-CM | POA: Diagnosis not present

## 2022-09-12 DIAGNOSIS — C50411 Malignant neoplasm of upper-outer quadrant of right female breast: Secondary | ICD-10-CM

## 2022-09-12 DIAGNOSIS — Z7982 Long term (current) use of aspirin: Secondary | ICD-10-CM | POA: Diagnosis not present

## 2022-09-12 DIAGNOSIS — Z170 Estrogen receptor positive status [ER+]: Secondary | ICD-10-CM

## 2022-09-12 DIAGNOSIS — Z87891 Personal history of nicotine dependence: Secondary | ICD-10-CM | POA: Diagnosis not present

## 2022-09-12 DIAGNOSIS — Z923 Personal history of irradiation: Secondary | ICD-10-CM | POA: Diagnosis not present

## 2022-09-12 DIAGNOSIS — Z79899 Other long term (current) drug therapy: Secondary | ICD-10-CM | POA: Diagnosis not present

## 2022-09-12 NOTE — Progress Notes (Signed)
[facility]  Telephone:([phone]) [phone] [phone]) [phone]   ID: [name] DOB: 1961-04-21  MR#: [id number]  WER#:[id number]  Patient Care Team: [name], MD as PCP - General (Internal Medicine) Burnell Blanks, MD as PCP - Cardiology (Cardiology) Magrinat, Virgie Dad, MD (Inactive) as Consulting Physician (Oncology) Kyung Rudd, MD as Consulting Physician (Radiation Oncology) Burnell Blanks, MD as Consulting Physician (Cardiology) Delice Bison, Charlestine Massed, NP as Nurse Practitioner (Hematology and Oncology) Rolm Bookbinder, MD as Consulting Physician (General Surgery) [name], MD (Internal Medicine) Craft, Lorel Monaco, RN as Case Manager Greg Cutter, LCSW as [place] [employer] (Licensed Clinical Social Worker) Alyce Pagan as [place] (Licensed Clinical Social Worker)  CHIEF COMPLAINT: Estrogen receptor positive breast cancer  CURRENT TREATMENT: Exemestane   INTERVAL HISTORY: [name] returns today for follow-up of her estrogen receptor positive breast cancer.  She is tolerating exemestane really well.  She denies any new health complaints. She is scheduled for her mammogram and in the next couple weeks.  She is also overdue for a bone density scan.  She does not take any vitamin D supplementation at this time. Rest of the pertinent 10 point ROS reviewed and negative   COVID 19 VACCINATION STATUS: [place] x2, status post booster x1 as of August 2022   HISTORY OF CURRENT ILLNESS: On the original intake note:  [name] had routine screening mammography on 05/27/2018 showing a possible abnormality in the right breast. She underwent unilateral right  diagnostic mammography with tomography and right breast ultrasonography at [facility] on 06/02/2018 showing: breast density category B. An area of distortion is identified in the upper outer quadrant of  the right breast mammographically. Ultrasonography of this area found an irregular hypoechoic mass at the 11 o'clock upper outer quadrant measuring 1.8 x 1.8 x 1.4 cm and located 6 cm from the nipple. The right axilla is negative sonographically for abnormal lymph nodes.   Accordingly on 06/04/2018 she proceeded to biopsy of the right breast area in question. The pathology from this procedure showed ([id number]): Invasive ductal carcinoma grade 1 measuring 1.0 cm in greatest extent, with microcalcifications. Ductal carcinoma in situ high grade. Prognostic indicators significant for: estrogen receptor, 100% positive and progesterone receptor, 100% positive, both with strong staining intensity. Proliferation marker Ki67 at 8%. HER2  not amplified with ratios HER2/CEP17 signals 1.32 and average HER2 copies per cell 1.85  The patient's subsequent history is as detailed below.   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; HAS LOWER BACK PAIN-UNABLE TO STAND MORE THAN 10 MIN; ARTHRITIS "ALL OVER"   Asthma    Blood transfusion    2013[facility]   Breast cancer in female [facility])    Right   CAD (coronary artery disease)    Cath 2010 with DES x 1 RCA-- PT'S CARDIOLOGIST IS DR. MCALHANY   Chronic diastolic congestive heart failure (HCC)    COPD (chronic obstructive pulmonary disease) (HCC)    Depression    Diabetes mellitus DIAGNOSED IN2010   Dyspnea    with much ambulation   Eczema    on back   Headache    migraines younger- rare now 02/07/21   Heart murmur    no current problems   History  of hiatal hernia    History of kidney stones    passed or blasted   Hyperlipidemia    Hypertension    Morbid obesity with body mass index of 60.0-69.9 in adult [facility])    Myocardial infarction ([place])    PT THINKS SHE WAS DX WITH MI AT THE TIME OF HEART STENTING    Neuromuscular disorder (HCC)    bilateral arm/hands   Oxygen dependent    uses 3L oxygen night/prn   Personal history of radiation therapy    Pneumonia    Pulmonary embolism ([place]) 02/08/2012   S/P RT TOTAL KNEE ON 02/03/12--ON 02/08/12--DEVELOPED ACUTE SOB AND CHEST PAIN--AND DIAGNOSED WITH  PULMONARY EMBOLUS AND PNEUMONIA   Restless leg syndrome    Sleep apnea    uses 3 liters O2 at night as needed   Uterine fibroid    NO PROBLEMS AT PRESENT FROM THE FIBROIDS-STATES SHE IS POST MENOPAUSAL-LAST MENSES 2010 EXCEPT FOR EPISODE THIS YR OF BLEEDING RELATED TO FIBROIDS.   Weakness    BOTH HANDS - S/P BILATERAL CARPAL TUNNEL RELEASE--BUT STILL HAS WEAKNESS--OFTEN DROPS THINGS  Migraines at younger age. MI in 2009   PAST SURGICAL HISTORY: Past Surgical History:  Procedure Laterality Date   BACK SURGERY  02/11/2021   Dr Louanne Skye   BREAST BIOPSY Right 06/04/2018   BREAST LUMPECTOMY Right 06/2018   BREAST LUMPECTOMY WITH RADIOACTIVE SEED AND SENTINEL LYMPH NODE BIOPSY Right 07/19/2018   Procedure: BREAST LUMPECTOMY WITH RADIOACTIVE SEED AND SENTINEL LYMPH NODE BIOPSY;  Surgeon: Alphonsa Overall, MD;  Location: [facility];  Service: General;  Laterality: Right;   CARDIAC CATHETERIZATION     CARDIAC VALVE REPLACEMENT     2017   CARPAL TUNNEL RELEASE     Bilateral   CHOLECYSTECTOMY     COLONOSCOPY     CORONARY ANGIOPLASTY     2010 has stent in place   CYSTOSCOPY W/ RETROGRADES Right 09/21/2013   Procedure: CYSTOSCOPY WITH RIGHT RETROGRADE PYELOGRAM RIGHT DOUBLE J STENT ;  Surgeon: Fredricka Bonine, MD;  Location: [facility];  Service: Urology;  Laterality: Right;   CYSTOSCOPY WITH URETEROSCOPY AND STENT PLACEMENT Right 10/25/2013   Procedure: CYSTOSCOPY RIGHT URETEROSCOPY HOLMIUM LASER LITHO AND STENT PLACEMENT;  Surgeon: Fredricka Bonine, MD;  Location[facility];  Service: Urology;  Laterality: Right;   HERNIA REPAIR     INTRAOPERATIVE TRANSESOPHAGEAL ECHOCARDIOGRAM N/A 12/12/2014    Procedure: INTRAOPERATIVE TRANSESOPHAGEAL ECHOCARDIOGRAM;  Surgeon: Burnell Blanks, MD;  Location: [facility];  Service: Cardiovascular;  Laterality: N/A;   JOINT REPLACEMENT     bil total knees   KNEE ARTHROPLASTY  02/03/2012   Procedure: COMPUTER ASSISTED TOTAL KNEE ARTHROPLASTY;  Surgeon: Mcarthur Rossetti, MD;  Location: [facility];  Service: Orthopedics;  Laterality: Right;  Right total knee arthroplasty   LEFT AND RIGHT HEART CATHETERIZATION WITH CORONARY ANGIOGRAM N/A 03/17/2013   Procedure: LEFT AND RIGHT HEART CATHETERIZATION WITH CORONARY ANGIOGRAM;  Surgeon: Burnell Blanks, MD;  Location[facility] CATH LAB;  Service: Cardiovascular;  Laterality: N/A;   LEFT AND RIGHT HEART CATHETERIZATION WITH CORONARY/GRAFT ANGIOGRAM N/A 09/14/2014   Procedure: LEFT AND RIGHT HEART CATHETERIZATION WITH Beatrix Fetters;  Surgeon: Burnell Blanks, MD;  Location: [facility] CATH LAB;  Service: Cardiovascular;  Laterality: N/A;   REVERSE SHOULDER ARTHROPLASTY Left 03/06/2022   Procedure: LEFT SHOULDER REPLACEMENT APPLICATION OF WOUND VAC;  Surgeon: [name], MD;  Location: [facility];  Service: Orthopedics;  Laterality: Left;   TEE WITHOUT CARDIOVERSION N/A 03/14/2013  Procedure: TRANSESOPHAGEAL ECHOCARDIOGRAM (TEE);  Surgeon: Lelon Perla, MD;  Location: [facility] ENDOSCOPY;  Service: Cardiovascular;  Laterality: N/A;   TEE WITHOUT CARDIOVERSION N/A 11/14/2014   Procedure: TRANSESOPHAGEAL ECHOCARDIOGRAM (TEE);  Surgeon: Lelon Perla, MD;  Location: [facility] ENDOSCOPY;  Service: Cardiovascular;  Laterality: N/A;   TONSILLECTOMY     maybe as a child- does not know   TOTAL KNEE ARTHROPLASTY  09/10/2012   Procedure: TOTAL KNEE ARTHROPLASTY;  Surgeon: Mcarthur Rossetti, MD;  [facility];  Service: Orthopedics;  Laterality: Left;   TOTAL KNEE REVISION Right 07/15/2013   Procedure: REVISION ARTHROPLASTY RIGHT KNEE;  Surgeon: Mcarthur Rossetti, MD;  [facility];  Service:  Orthopedics;  Laterality: Right;   TRANSCATHETER AORTIC VALVE REPLACEMENT, TRANSFEMORAL N/A 12/12/2014   Procedure: TRANSCATHETER AORTIC VALVE REPLACEMENT, TRANSFEMORAL;  Surgeon: Burnell Blanks, MD;  Location: [facility];  Service: Cardiovascular;  Laterality: N/A;   TRIGGER FINGER RELEASE  09/10/2012   Procedure: RELEASE TRIGGER FINGER/A-1 PULLEY;  Surgeon: Mcarthur Rossetti, MD;  [facility];  Service: Orthopedics;  Laterality: Right;  Right Ring Finger   TUBAL LIGATION    Bovine Mitral Aortic valve surgery 2015   FAMILY HISTORY Family History  Problem Relation Age of Onset   Breast cancer Mother        stage IV at diagnosis   Emphysema Mother        smoked   Heart disease Mother    COPD Father        smoked   Asthma Father    Heart disease Father    Cancer Brother        Sinus  The patient's father died at age 65 due to emphysema and possibly cancer . The patient's mother died at age 52 due to breast cancer with stage IV disease at presentation. The patient has 2 full and 1 half brothers and 1 sister.  One of the patient's brothers had nasopharyngeal cancer stage IV diagnosed at age 2. There was also a paternal great grandfather with prostate cancer. The patient denies a family history of ovarian cancer.    GYNECOLOGIC HISTORY:  Patient's last menstrual period was 02/03/2012. Menarche: 61 years old Age at first live birth: 61 years old She is [place] P2. Her LMP was around age 64. She used oral contraception in her 01'S with no complications.    SOCIAL HISTORY:  Renesmee is disabled. Her husband, Arnette Norris is retired from working in Health and safety inspector from [employer], Estate agent, and smoke damage. The patient's son, Christia Reading lives in [place] and works as a Biomedical scientist.  He is getting married in 2022.  The patient's son Legrand Como 30 lives in [place] and is a Environmental education officer of a [employer].  He just bought a house (August 2022).  The patient has no grandchildren. She attends  [employer].     ADVANCED DIRECTIVES: In the absence of any documents to the contrary the patient's husband is her healthcare power of attorney   HEALTH MAINTENANCE: Social History   Tobacco Use   Smoking status: Former    Packs/day: 1.50    Years: 30.00    Total pack years: 45.00    Types: Cigarettes    Quit date: 12/29/2000    Years since quitting: 21.7   Smokeless tobacco: Never  Vaping Use   Vaping Use: Never used  Substance Use Topics   Alcohol use: Not Currently   Drug use: Not Currently    Types: Marijuana  Comment: CBD oils through vape shops     Colonoscopy: about 8 year ago  PAP: 10/20/2016 normal   Bone density: no   No Known Allergies  Current Outpatient Medications  Medication Sig Dispense Refill   B-D UF III MINI PEN NEEDLES 31G X 5 MM MISC USE AS DIRECTED DAILY 100 each 5   Accu-Chek FastClix Lancets MISC Check blood sugar two times a day 102 each 9   albuterol (PROVENTIL) (2.5 MG/3ML) 0.083% nebulizer solution USE 1 VIAL IN NEBULIZER EVERY 4 HOURS AS NEEDED FOR WHEEZING FOR SHORTNESS OF BREATH 150 mL 0   amlodipine-atorvastatin (CADUET) 5-80 MG tablet Take 1 tablet by mouth daily. 90 tablet 3   Ascorbic Acid (VITAMIN C) 1000 MG tablet Take 1,000 mg by mouth daily.     ASPIRIN ADULT LOW STRENGTH 81 MG EC tablet TAKE 1 Tablet BY MOUTH ONCE DAILY 90 tablet 3   Blood Glucose Monitoring Suppl (ACCU-CHEK GUIDE) w/Device KIT 1 each by Does not apply route 2 (two) times daily. 1 kit 1   busPIRone (BUSPAR) 10 MG tablet Take 1 tablet (10 mg total) by mouth 3 (three) times daily. 90 tablet 3   citalopram (CELEXA) 20 MG tablet TAKE 1 TABLET BY MOUTH ONCE DAILY. DO NOT TAKE WHILE TAKING DULOXETINE 30 tablet 3   clopidogrel (PLAVIX) 75 MG tablet TAKE 1 TABLET BY MOUTH IN THE MORNING 90 tablet 3   Continuous Blood Gluc Sensor (FREESTYLE LIBRE 3 SENSOR) MISC Place 1 sensor on the skin every 14 days. Use to check glucose continuously 2 each 11   Dulaglutide  (TRULICITY) 3 CB/7.6EG SOPN Inject 3 mg as directed once a week. 2 mL 1   Evolocumab (REPATHA SURECLICK) 315 MG/ML SOAJ INJECT 1 PEN INTO THE SKIN EVERY 14 DAYS 2 mL 11   exemestane (AROMASIN) 25 MG tablet Take 1 tablet (25 mg total) by mouth daily after breakfast. 90 tablet 3   ezetimibe (ZETIA) 10 MG tablet Take 1 tablet (10 mg total) by mouth daily. 90 tablet 3   ferrous sulfate 325 (65 FE) MG tablet Take 1 tablet (325 mg total) by mouth every other day. 30 tablet 3   fexofenadine (ALLEGRA) 180 MG tablet Take 1 tablet (180 mg total) by mouth daily. 90 tablet 1   fluticasone (FLONASE) 50 MCG/ACT nasal spray Place 1 spray into both nostrils daily. Begin by using 2 sprays in each nare daily for 3 to 5 days, then decrease to 1 spray in each nare daily. 32 mL 1   Fluticasone-Umeclidin-Vilant (TRELEGY ELLIPTA) 100-62.5-25 MCG/ACT AEPB INHALE 1 PUFF ONCE DAILY Strength: 100-62.5-25 MCG/ACT 180 each 5   furosemide (LASIX) 40 MG tablet Take 1 tablet (40 mg total) by mouth as needed for fluid or edema. 180 tablet 1   gabapentin (NEURONTIN) 300 MG capsule Take 2 capsules (600 mg total) by mouth in the morning AND 3 capsules (900 mg total) at bedtime. 150 capsule 1   glucose blood (ACCU-CHEK GUIDE) test strip Check blood sugar 2 times per day 100 each 9   insulin glargine-yfgn (SEMGLEE) 100 UNIT/ML Pen Inject 30 Units into the skin at bedtime. 15 mL 2   losartan-hydrochlorothiazide (HYZAAR) 100-25 MG tablet Take 1 tablet by mouth daily. 90 tablet 3   metFORMIN (GLUCOPHAGE-XR) 500 MG 24 hr tablet Take 1 tablet (500 mg total) by mouth 2 (two) times daily. 90 tablet 2   methocarbamol (ROBAXIN) 500 MG tablet Take 1 tablet (500 mg total) by mouth every 6 (six) hours  as needed for muscle spasms. 30 tablet 0   metoprolol succinate (TOPROL XL) 50 MG 24 hr tablet Take 1 tablet (50 mg total) by mouth daily. Take with or immediately following a meal. 90 tablet 3   montelukast (SINGULAIR) 10 MG tablet Take 1 tablet (10  mg total) by mouth at bedtime. 90 tablet 2   Potassium 99 MG TABS Take 99 mg by mouth daily.     senna-docusate (SENNA-TIME S) 8.6-50 MG tablet Take 1 tablet by mouth at bedtime as needed for mild constipation. 30 tablet 1   traMADol (ULTRAM) 50 MG tablet Take 1 tablet (50 mg total) by mouth every 6 (six) hours as needed. 15 tablet 0   traZODone (DESYREL) 50 MG tablet Take 0.5-1 tablets (25-50 mg total) by mouth at bedtime. 30 tablet 3   VENTOLIN HFA 108 (90 Base) MCG/ACT inhaler INHALE 2 PUFFS BY MOUTH EVERY 6 HOURS AS NEEDED FOR WHEEZING OR SHORTNESS OF BREATH 18 g 0   No current facility-administered medications for this visit.    OBJECTIVE: white woman who appears stated age  78:   09/12/22 1122  BP: 133/69  Pulse: 80  Resp: 17  Temp: (!) 97 F (36.1 C)  SpO2: 91%      Body mass index is 54.39 kg/m.   Wt Readings from Last 3 Encounters:  09/12/22 (!) 307 lb 1 oz (139.3 kg)  08/27/22 (!) 310 lb 3.2 oz (140.7 kg)  06/27/22 (!) 311 lb 3.2 oz (141.2 kg)     ECOG FS:2 - Symptomatic, <50% confined to bed  Sclerae unicteric, EOMs intact Wearing a mask No cervical or supraclavicular adenopathy Lungs no rales or rhonchi Heart regular rate and rhythm Abd soft, obese, nontender, positive bowel sounds MSK no focal spinal tenderness, no upper extremity lymphedema Neuro: nonfocal, well oriented, appropriate affect Breasts: Bilateral breast examined.  No concern for recurrence or regional adenopathy  LAB RESULTS:  CMP     Component Value Date/Time   NA 138 09/11/2022 1126   NA 138 06/04/2022 1541   K 3.6 09/11/2022 1126   CL 98 09/11/2022 1126   CO2 33 (H) 09/11/2022 1126   GLUCOSE 214 (H) 09/11/2022 1126   BUN 15 09/11/2022 1126   BUN 13 06/04/2022 1541   CREATININE 0.87 09/11/2022 1126   CREATININE 0.87 07/09/2012 1603   CALCIUM 9.7 09/11/2022 1126   PROT 6.8 09/11/2022 1126   PROT 6.5 10/17/2020 0924   ALBUMIN 4.1 09/11/2022 1126   ALBUMIN 4.2 10/17/2020 0924    AST 12 (L) 09/11/2022 1126   ALT 9 09/11/2022 1126   ALKPHOS 65 09/11/2022 1126   BILITOT 1.1 09/11/2022 1126   GFRNONAA >60 09/11/2022 1126   GFRAA 81 10/17/2020 0924   GFRAA >60 07/14/2019 1126    No results found for: "TOTALPROTELP", "ALBUMINELP", "A1GS", "A2GS", "BETS", "BETA2SER", "GAMS", "MSPIKE", "SPEI"  No results found for: "KPAFRELGTCHN", "LAMBDASER", "KAPLAMBRATIO"  Lab Results  Component Value Date   WBC 6.9 09/11/2022   NEUTROABS 5.0 09/11/2022   HGB 12.4 09/11/2022   HCT 38.3 09/11/2022   MCV 87.0 09/11/2022   PLT 211 09/11/2022   No results found for: "LABCA2"  No components found for: "IOMBTD974"  No results for input(s): "INR" in the last 168 hours.  No results found for: "LABCA2"  No results found for: "BUL845"  No results found for: "CAN125"  No results found for: "CAN153"  No results found for: "CA2729"  No components found for: "HGQUANT"  No results found  for: "CEA1", "CEA" / No results found for: "CEA1", "CEA"   No results found for: "AFPTUMOR"  No results found for: "CHROMOGRNA"  No results found for: "HGBA", "HGBA2QUANT", "HGBFQUANT", "HGBSQUAN" (Hemoglobinopathy evaluation)   No results found for: "LDH"  Lab Results  Component Value Date   IRON 49 06/27/2022   TIBC 472 (H) 06/27/2022   IRONPCTSAT 10 (L) 06/27/2022   (Iron and TIBC)  Lab Results  Component Value Date   FERRITIN 16 09/11/2022    Urinalysis    Component Value Date/Time   COLORURINE YELLOW 02/14/2022 1313   APPEARANCEUR Cloudy (A) 06/19/2022 1603   LABSPEC 1.021 02/14/2022 1313   PHURINE 5.0 02/14/2022 1313   GLUCOSEU Negative 06/19/2022 1603   HGBUR NEGATIVE 02/14/2022 1313   BILIRUBINUR Negative 06/19/2022 1603   KETONESUR NEGATIVE 02/14/2022 1313   PROTEINUR 1+ (A) 06/19/2022 1603   PROTEINUR NEGATIVE 02/14/2022 1313   UROBILINOGEN 1.0 12/08/2014 1303   NITRITE Positive (A) 06/19/2022 1603   NITRITE NEGATIVE 02/14/2022 1313   LEUKOCYTESUR 3+ (A)  06/19/2022 1603   LEUKOCYTESUR LARGE (A) 02/14/2022 1313    STUDIES: No results found.    ELIGIBLE FOR AVAILABLE RESEARCH PROTOCOL: no   ASSESSMENT: 61 y.o. [place], [place] woman status post right breast upper outer quadrant biopsy 06/04/2018 for a clinical T1c N0, stage IA invasive ductal carcinoma, grade 1, estrogen and progesterone receptor strongly positive, HER-2 not amplified, with an MIB-108%  (1) status post right lumpectomy and sentinel lymph node sampling 07/19/2018 for a pT1c pN1, stage IB invasive ductal carcinoma, grade 1, with negative margins  (2) Mammaprint obtained on the definitive surgical specimen showed "low risk", predicting a risk of recurrence outside the breast within 5 years of 2 to 3% if the patient takes antiestrogens for 5 years.  No significant benefit from chemotherapy anticipated  (3) adjuvant radiation 09/01/2018 - 10/19/2018  Site/dose: The patient initially received a dose of 50.4 Gy in 28 fractions to the right breast using whole-breast tangent fields. This was delivered using a 3-D conformal technique. The patient then received a boost to the seroma. This delivered an additional 10 Gy in 5 fractions using 15x photons with a Complex Isodose technique. The total dose was 60.4 Gy.  (4) started anastrozole 11/28/2018, discontinued after 4 weeks with diarrhea and other side effects  (5) exemestane started January 2020.   PLAN: Ms. Londo is tolerating exemestane really well.  She could not tolerate anastrozole.  She will complete 5 years of antiestrogen therapy in December 2024. Physical examination, no concerns for breast cancer recurrence.  Her next mammogram is scheduled on September 18, 2022.  We have also discussed about obtaining a baseline bone density, this has been ordered today.  We have discussed about considering vitamin D supplementation from over-the-counter, calcium rich foods and weightbearing exercises as tolerated. She was encouraged to  continue self breast exam monthly and report any changes to [place].  She will otherwise return to clinic in 1 year or sooner as needed.  Total encounter time: 20 minutes  *Total Encounter Time as defined by the Centers for Medicare and Medicaid Services includes, in addition to the face-to-face time of a patient visit (documented in the note above) non-face-to-face time: obtaining and reviewing outside history, ordering and reviewing medications, tests or procedures, care coordination (communications with other health care professionals or caregivers) and documentation in the medical record.

## 2022-09-15 ENCOUNTER — Telehealth: Payer: Self-pay | Admitting: *Deleted

## 2022-09-15 ENCOUNTER — Telehealth: Payer: Self-pay | Admitting: Hematology and Oncology

## 2022-09-15 NOTE — Telephone Encounter (Addendum)
-----   Message from [name], MD sent at 09/12/2022  5:24 PM EDT ----- Ferritin is at the lower limit of normal.  Okay to continue oral iron.  This RN called pt to discuss iron supplement - pt states she is taken an iron tablet that is prescribed by her primary MD "I was taken the OTC kind but that really constipated me"   The current pill she is taking every other day due to " it makes me poop a lot ".  Pt has been on the above for approximately 2 months.

## 2022-09-15 NOTE — Telephone Encounter (Signed)
Scheduled appointment per 9/15 los. Patient is aware.

## 2022-09-18 ENCOUNTER — Ambulatory Visit
Admission: RE | Admit: 2022-09-18 | Discharge: 2022-09-18 | Disposition: A | Discharge status: Home / Self Care | Payer: Medicaid Other | Source: Ambulatory Visit | Attending: Adult Health | Admitting: Adult Health

## 2022-09-18 ENCOUNTER — Other Ambulatory Visit: Payer: Self-pay | Admitting: Student

## 2022-09-18 ENCOUNTER — Other Ambulatory Visit: Payer: Self-pay | Admitting: Licensed Clinical Social Worker

## 2022-09-18 DIAGNOSIS — Z1231 Encounter for screening mammogram for malignant neoplasm of breast: Secondary | ICD-10-CM

## 2022-09-18 DIAGNOSIS — J441 Chronic obstructive pulmonary disease with (acute) exacerbation: Secondary | ICD-10-CM

## 2022-09-18 NOTE — Patient Instructions (Signed)
Visit Information  Ms. Gallant was given information about Medicaid Managed Care team care coordination services as a part of their [facility] Medicaid benefit. [name] verbally consented to engagement with the [facility] Managed Care team.   If you are experiencing a medical emergency, please call 911 or report to your local emergency department or urgent care.   If you have a non-emergency medical problem during routine business hours, please contact your provider's office and ask to speak with a nurse.   For questions related to your [facility] health plan, please call: [phone] or go here:[email or web address]  If you would like to schedule transportation through your [facility] plan, please call the following number at least 2 days in advance of your appointment: [phone].  You can also use the MTM portal or MTM mobile app to manage your rides. For the portal, please go to [email or web address].  Call the [place] at [phone], at any time, 24 hours a day, 7 days a week. If you are in danger or need immediate medical attention call 911.  If you would like help to quit smoking, call 1-800-QUIT-NOW 301-443-0490) OR Espaol: 1-855-Djelo-Ya (6-294-765-4650) o para ms informacin haga clic aqu or Text READY to 200-400 to register via text  Following is a copy of your plan of care:  Care Plan : LCSW Plan of Care  Updates made by Greg Cutter, LCSW since 09/18/2022 12:00 AM     Problem: Depression Identification (Depression)      Long-Range Goal: Depressive Symptoms Identified   Start Date: 04/17/2022  Recent Progress: On track  Note:   Long-Range Goal: I want to start mental health treatment    Start Date: 04/17/2022  Priority: High  Note:   Priority: High   Timeframe:  Long-Range Goal Priority:  High Start Date:   04/17/22                Expected End Date:  ongoing                 Follow Up Date--10/14/22 at 11 am     - check out counseling - keep 90 percent of counseling appointments - schedule counseling appointment    Why is this important?             Beating depression may take some time.            If you don't feel better right away, don't give up on your treatment plan.     Current barriers:   Chronic Mental Health needs related to depression, stress and anxiety. Patient requires Support, Education, Resources, Referrals, Advocacy, and Care Coordination, in order to meet Unmet Mental Health Needs. Patient will implement clinical interventions discussed today to decrease symptoms of depression and increase knowledge and/or ability of: coping skills. Mental Health Concerns and Social Isolation Patient lacks knowledge of available community counseling agencies and resources.   Clinical Goal(s): verbalize understanding of plan for management of Anxiety, Depression, and Stress and demonstrate a reduction in symptoms. Patient will connect with a provider for ongoing mental health treatment, increase coping skills, healthy habits, self-management skills, and stress reduction        Patient Goals/Self-Care Activities: Over the next 120 days Attend scheduled medical appointments Utilize healthy coping skills and supportive resources discussed Contact PCP with any questions or concerns Keep 90 percent of counseling appointments Call your insurance provider for more information about your Enhanced Benefits  Check out counseling resources provided  Begin  personal counseling with LCSW, to reduce and manage symptoms of Depression and Stress, until well-established with mental health provider Accept all calls from representative with [facility] in an effort to establish ongoing mental health counseling and supportive services. Incorporate into daily practice - relaxation techniques, deep breathing exercises, and mindfulness meditation strategies. Talk about feelings with friends, family members, spiritual advisor,  etc. Contact LCSW directly [phone]), if you have questions, need assistance, or if additional social work needs are identified between now and our next scheduled telephone outreach call. Call [phone] for mental health hotline/crisis line if needed (24/7 available) Try techniques to reduce symptoms of anxiety/negative thinking (deep breathing, distraction, positive self talk, etc)  - develop a personal safety plan - develop a plan to deal with triggers like holidays, anniversaries - exercise at least 2 to 3 times per week - have a plan for how to handle bad days - journal feelings and what helps to feel better or worse - spend time or talk with others at least 2 to 3 times per week - watch for early signs of feeling worse - begin personal counseling - call and visit an old friend - check out volunteer opportunities - join a support group - laugh; watch a funny movie or comedian - learn and use visualization or guided imagery - perform a random act of kindness - practice relaxation or meditation daily - start or continue a personal journal - practice positive thinking and self-talk -continue with compliance of taking medication        05/21/2022   12:53 PM 04/17/2022    3:17 PM 11/14/2021    4:02 PM 08/21/2021    4:33 PM 12/10/2020    4:00 PM  Depression screen PHQ 2/9  Decreased Interest '2 2 1 '$ 0 1  Down, Depressed, Hopeless '2 2 1 '$ 0 0  PHQ - 2 Score '4 4 2 '$ 0 1  Altered sleeping '3 3 3   1  '$ Tired, decreased energy '3 3 1   1  '$ Change in appetite '3 3 1   1  '$ Feeling bad or failure about yourself  0 1 0   0  Trouble concentrating 0 0 0   0  Moving slowly or fidgety/restless 0   0   0  Suicidal thoughts 0 0 0   0  PHQ-9 Score '13 14 7   4  '$ Difficult doing work/chores Somewhat difficult Somewhat difficult Not difficult at all   Not difficult at all        24- Hour Availability:    [facility]  [address] [place], [place] Crisis  [phone]   [facility] [employer] [phone]   [place]  [phone]    [place]  [phone] (after hours)   Therapeutic Alternative/Mobile Crisis   [phone]   [place] National Suicide Hotline  727-522-3360 (TALK) OR 988   Call 911 or go to emergency room   [facility]  [phone]);  [place] and [employer]  [phone]); [place], [place], [place], [place], [place], [place], [place]

## 2022-09-18 NOTE — Patient Outreach (Signed)
Medicaid Managed Care Social Work Note  09/18/2022 Name:  [name] MRN:  [id number] DOB:  [birth year]  [name] is an 61 y.o. year old female who is a primary patient of [name], MD.  The [place] team was consulted for assistance with:  [place] and Resources  Ms. Pote was given information about Medicaid Managed Care Coordination team services today. [name] Patient agreed to services and verbal consent obtained.  Engaged with patient  for by telephone forfollow up visit in response to referral for case management and/or care coordination services.   Assessments/Interventions:  Review of past medical history, allergies, medications, health status, including review of consultants reports, laboratory and other test data, was performed as part of comprehensive evaluation and provision of chronic care management services.  SDOH: (Social Determinant of Health) assessments and interventions performed: SDOH Interventions    Flowsheet Row Patient Outreach Telephone from 09/05/2022 in [place] Patient Outreach Telephone from 08/01/2022 in [place] Patient Outreach Telephone from 06/12/2022 in [facility] Office Visit from 06/04/2022 in [place] Patient Outreach Telephone from 05/21/2022 in [place] Patient Outreach Telephone from 05/19/2022 in [place] Interventions        Food Insecurity Interventions -- -- -- -- -- Other (Comment)  [patient provided resources]  Housing Interventions -- -- -- -- -- Intervention Not Indicated  Depression Interventions/Treatment  Currently on Treatment, Counseling, Medication -- -- Medication, Counseling Medication, Counseling --  Stress  Interventions Offered [employer], Provide Counseling [facility], [place] [facility], Provide Counseling -- [place], Provide Counseling --       Advanced Directives Status:  See Care Plan for related entries.  Care Plan                 No Known Allergies  Medications Reviewed Today     Reviewed by [name], MD (Physician) on 09/12/22 at 1410  Med List Status: <None>   Medication Order Taking? Sig Documenting Provider Last Dose Status Informant  Accu-Chek FastClix Lancets MISC [id number] No Check blood sugar two times a day Lorella Nimrod, MD Taking Active Self  albuterol (PROVENTIL) (2.5 MG/3ML) 0.083% nebulizer solution [id number]  USE 1 VIAL IN NEBULIZER EVERY 4 HOURS AS NEEDED FOR WHEEZING FOR SHORTNESS OF Loyal Gambler, MD  Active   amlodipine-atorvastatin (CADUET) 5-80 MG tablet [id number] No Take 1 tablet by mouth daily. [name], MD 03/06/2022 0730 Active Self  Ascorbic Acid (VITAMIN C) 1000 MG tablet [id number] No Take 1,000 mg by mouth daily. [provider] Past Month Active Self  ASPIRIN ADULT LOW STRENGTH 81 MG EC tablet [id number] No TAKE 1 Tablet BY MOUTH ONCE DAILY Lorella Nimrod, MD 02/27/2022 Active Self  B-D UF III MINI PEN NEEDLES 31G X 5 MM MISC [id number]  USE AS DIRECTED DAILY Angelica Pou, MD  Active   Blood Glucose Monitoring Suppl (ACCU-CHEK GUIDE) w/Device KIT [id number] No 1 each by Does not apply route 2 (two) times daily. Lorella Nimrod, MD Taking Active Self  busPIRone (BUSPAR) 10 MG tablet [id number]  Take 1 tablet (10 mg total) by mouth 3 (three) times daily. Salley Slaughter, NP  Active   citalopram (CELEXA) 20 MG tablet [id number]  TAKE 1 TABLET BY MOUTH ONCE DAILY. DO NOT  TAKE WHILE TAKING DULOXETINE Eulis Canner E, NP  Active   clopidogrel (PLAVIX) 75 MG tablet [id number] No TAKE 1 TABLET BY MOUTH IN THE [name], MD 02/27/2022 Active Self  Continuous Blood Gluc Sensor (FREESTYLE LIBRE 3 SENSOR) MISC [id number]  Place 1 sensor on the skin every 14 days. Use to check glucose continuously [name], MD  Active   Dulaglutide (TRULICITY) 3 JE/5.6DJ SOPN [id number]  Inject 3 mg as directed once a week. [name], MD  Active   Evolocumab [facility]RE[facility]) 588 MG/ML [name] [id number]  INJECT 1 PEN INTO THE SKIN EVERY 14 DAYS Burnell Blanks, MD  Active   exemestane (AROMASIN) 25 MG tablet [id number] No Take 1 tablet (25 mg total) by mouth daily after breakfast. [name], MD 03/06/2022 0730 Active Self  ezetimibe (ZETIA) 10 MG tablet [id number] No Take 1 tablet (10 mg total) by mouth daily. [name], PA-C 03/06/2022 0730 Expired 05/26/22 2359   ferrous sulfate 325 (65 FE) MG tablet [id number]  Take 1 tablet (325 mg total) by mouth every other day. [name], MD  Active   fexofenadine [facility]) 180 MG tablet [id number]  Take 1 tablet (180 mg total) by mouth daily. [name], PA-C  Active   fluticasone (FLONASE) 50 MCG/ACT nasal spray [id number]  Place 1 spray into both nostrils daily. Begin by using 2 sprays in each nare daily for 3 to 5 days, then decrease to 1 spray in each nare daily. [name], PA-C  Active   Discontinued 05/09/20 1429   Fluticasone-Umeclidin-Vilant (TRELEGY ELLIPTA) 100-62.5-25 MCG/ACT AEPB [id number] No INHALE 1 PUFF ONCE DAILY Strength: 100-62.5-25 MCG/ACT Lacinda Axon, MD 03/06/2022 0730 Active Self  furosemide (LASIX) 40 MG tablet [id number]  Take 1 tablet (40 mg total) by mouth as needed for fluid or edema. Dorethea Clan, DO  Active   gabapentin (NEURONTIN) 300 MG capsule [id number]  Take 2 capsules (600 mg total) by mouth in the morning AND 3 capsules (900 mg total) at bedtime. [name], MD  Active   glucose blood (ACCU-CHEK GUIDE) test strip [id number] No Check blood sugar 2 times per day [name],  MD Taking Active Self  insulin glargine-yfgn (SEMGLEE) 100 UNIT/ML Pen [id number]  Inject 30 Units into the skin at bedtime. [name], MD  Active   losartan-hydrochlorothiazide [facility]) 100-25 MG tablet [id number] No Take 1 tablet by mouth daily. [name], MD 03/05/2022 Active Self  metFORMIN (GLUCOPHAGE-XR) 500 MG 24 hr tablet [id number]  Take 1 tablet (500 mg total) by mouth 2 (two) times daily. [name], MD  Active   methocarbamol (ROBAXIN) 500 MG tablet [id number]  Take 1 tablet (500 mg total) by mouth every 6 (six) hours as needed for muscle spasms. [name], MD  Active   metoprolol succinate (TOPROL XL) 50 MG 24 hr tablet [id number] No Take 1 tablet (50 mg total) by mouth daily. Take with or immediately following a meal. [name], MD 03/06/2022 0730 Active Self  montelukast (SINGULAIR) 10 MG tablet [id number]  Take 1 tablet (10 mg total) by mouth at bedtime. [name], MD  Active   Potassium 99 MG TABS [id number] No Take 99 mg by mouth daily. [provider] Past Month Active Self  senna-docusate (SENNA-TIME S) 8.6-50 MG tablet [id number]  Take 1 tablet by mouth at bedtime as needed for mild constipation. [name], MD  Active   traMADol (ULTRAM) 50 MG tablet [id number]  Take 1 tablet (50 mg  total) by mouth every 6 (six) hours as needed. Hazel Sams, PA-C  Active   traZODone (DESYREL) 50 MG tablet [id number]  Take 0.5-1 tablets (25-50 mg total) by mouth at bedtime. Salley Slaughter, NP  Active   VENTOLIN HFA 108 (90 Base) MCG/ACT inhaler [id number]  INHALE 2 PUFFS BY MOUTH EVERY 6 HOURS AS NEEDED FOR WHEEZING OR SHORTNESS OF Loyal Gambler, MD  Active             Patient Active Problem List   Diagnosis Date Noted   Diabetic neuropathy ([place]) 08/29/2022   Vaginal lesion 08/29/2022   PTSD (post-traumatic stress disorder) 06/24/2022   GAD (generalized anxiety disorder) 06/24/2022   Major depressive disorder, recurrent  episode, moderate (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   Healthcare maintenance 11/17/2021   Spondylolisthesis, lumbar region 02/11/2021    Class: Acute   Malignant neoplasm of upper-outer quadrant of right breast in female, estrogen receptor positive ([place]) 06/10/2018   Restless leg syndrome 02/04/2018   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   Depression 08/06/2015   Hypertension associated with diabetes ([place])    Aortic stenosis    Chronic diastolic congestive heart failure ([place])    COPD (chronic obstructive pulmonary disease) ([place]) 09/21/2013   Obstructive sleep apnea 09/21/2013   Nocturnal hypoxemia 09/12/2013   Hyperlipidemia 11/30/2012   History of pulmonary embolus ([place]) 07/09/2012   Normocytic anemia 02/08/2012   Type 2 diabetes mellitus without complication, without long-term current use of insulin ([place]) 02/08/2012   Asthma 02/08/2012   GERD (gastroesophageal reflux disease)    CAD (coronary artery disease) 12/08/2011    Conditions to be addressed/monitored per PCP order:  Anxiety and Depression  Care Plan : LCSW Plan of Care  Updates made by Greg Cutter, LCSW since 09/18/2022 12:00 AM     Problem: Depression Identification (Depression)      Long-Range Goal: Depressive Symptoms Identified   Start Date: 04/17/2022  Recent Progress: On track  Note:   Long-Range Goal: I want to start mental health treatment    Start Date: 04/17/2022  Priority: High  Note:   Priority: High   Timeframe:  Long-Range Goal Priority:  High Start Date:   04/17/22                Expected End Date:  ongoing                 Follow Up Date--10/14/22 at 11 am    - check out counseling - keep 90 percent of counseling appointments - schedule counseling appointment    Why is this important?             Beating depression may take some time.             If you don't feel better right away, don't give up on your treatment plan.     Current barriers:   Chronic Mental Health needs related to depression, stress and anxiety. Patient requires Support, Education, Resources, Referrals, Advocacy, and Care Coordination, in order to meet Unmet Mental Health Needs. Patient will implement clinical interventions discussed today to decrease symptoms of depression and increase knowledge and/or ability of: coping skills. Mental Health Concerns and Social Isolation Patient lacks knowledge of available community counseling agencies and resources.   Clinical Goal(s): verbalize understanding of plan for management of Anxiety, Depression, and Stress  and demonstrate a reduction in symptoms. Patient will connect with a provider for ongoing mental health treatment, increase coping skills, healthy habits, self-management skills, and stress reduction        Clinical Interventions:  Assessed patient's previous and current treatment, coping skills, support system and barriers to care. Patient provided history. Patient and spouse lived in a shared home with two other adults. Patient reports that this is a current and stable living situation.  Verbalization of feelings encouraged, motivational interviewing employed Emotional support provided, positive coping strategies explored Self care/establishing healthy boundaries emphasized Patient reports that she takes medication for anxiety and depression. She reports that this has alleviated some of symptoms but not all. She reports that she still has crying spells. She needs help with finding psychiatry and counseling. She was successful in identifying triggers to anxiety and depression symptoms, in addition, to healthy coping skills.  Patient reports significant worsening anxiety and depression impacting her ability to function appropriately and carry out daily task. Patient receives strong support from spouse but he does  not have anxiety or depression and is unable to relate to her and her daily mental health struggles.  Patient is agreeable to referral to [facility] for counseling and psychiatry. [facility] LCSW made referral on 04/17/22. Patient will call [facility] tomorrow on 04/18/22 to schedule counseling and psychiatry appointments. Email sent to her with [facility]'s contact information.  LCSW provided education on relaxation techniques such as meditation, deep breathing, massage, grounding exercises or yoga that can activate the body's relaxation response and ease symptoms of stress and anxiety. LCSW ask that when pt is struggling with difficult emotions and racing thoughts that they start this relaxation response process. LCSW provided extensive education on healthy coping skills for anxiety. SW used active and reflective listening, validated patient's feelings/concerns, and provided emotional support. Patient will work on implementing appropriate self-care habits into their daily routine such as: staying positive, writing a gratitude list, drinking water, staying active around the house, taking their medications as prescribed, combating negative thoughts or emotions and staying connected with their family and friends. Positive reinforcement provided for this decision to work on this. Patient was receptive to anxiety and depression management coping skill education. She will try to increase her socialization over the next 90 days to decrease isolation.  Patient has problems with sleep disturbance. LCSW provided education on healthy sleep hygiene and what that looks like. LCSW encouraged patient to implement a night time routine into her schedule that works best for her and that she is able to maintain. Advised patient to implement deep breathing/grounding/meditation/self-care exercises into her nightly routine to combat racing thoughts at night. LCSW encouraged patient to wake up at the same time each day, make their sleeping environment  comfortable, exercise when able, to limit naps and to not eat or drink anything right before bed.   Motivational Interviewing employed Depression screen reviewed  PHQ2/ PHQ9 completed Mindfulness or Relaxation training provided Active listening / Reflection utilized  Advance Care and HCPOA education provided Emotional Support Provided Problem [place] strategies reviewed Provided psychoeducation for mental health needs  Provided brief CBT  Reviewed mental health medications and discussed importance of compliance:  Quality of sleep assessed & Sleep Hygiene techniques promoted  Participation in counseling encouraged  Verbalization of feelings encouraged  Suicidal Ideation/Homicidal Ideation assessed: Patient denies SI/HI  Review resources, discussed options and provided patient information about  [place] care team collaboration (see longitudinal plan of care) UPDATE 05/21/22- Patient was  successfully set up with counseling at [facility] but was not set up with psychiatry. She is in need of both services. Patient has an upcoming counseling session at [facility] on 06/24/22. [facility] LCSW completed joint call with patient to [facility] but was unable to reach anyone. [facility] LCSW left a voice message asking for them to return call to patient to get her scheduled with psychiatry as [facility] LCSW ask for both psychiatry and counseling in referral. [facility] LCSW ask if they needed an additional referral to please let [facility] LCSW know so this can be completed. [facility] LCSW sent patient an email with [facility]'s contact information including their address as well as a list of coping skills for depression and anxiety. Patient's spouse has multiple health conditions and tells patient that he "wishes to die" often which concerns her. [facility] LCSW provided emotional support and brief coping skill education on stress management.  UPDATE 06/12/22- Patient successfully contacted [facility] and set up appointments for  both psychiatry and counseling. Patient is eager to start treatment. Brief self-care education provided to patient. Patient is agreeable to contact [facility] LCSW directly if needed. [facility] LCSW will make one last follow up on call to ensure that patient was successfully connected to a long term mental health provider. Patient denies any current crises or concerns.  UPDATE 07/18/22- Patient has not been set up with counseling at [facility] and she has completed her first counseling session but she has not been set up with psychiatry still. She has made several calls to agency but has been unsuccessful in reaching them. Patient has an in person appointment at [facility] on 07/30/22 and she will ask them to enroll her in psychiatry at that time. [facility] LCSW will follow up and continue to support patient as needed. UPDATE 08/01/22- Patient reports that she is doing extremely well and that her mood has lifted over the last few weeks. She reports that her first counselor appointment well very well and she was able to build rapport with him. She will see her counselor 2X per month. Her next appointment for therapy is on 08/20/22. Patient has her initial appointment with psychiatry is on the 08/12/22. UPDATE- Patient successfully completed her psychiatry appointment and has a follow up scheduled in two months. However, patient reports having ongoing issues with sleep even with the medication and reports needing this medication to be adjusted. Pt will discuss this concern with her therapist on 09/09/22 but was encouraged to call [facility] before then if needed to address this issue. [facility] LCSW will follow up in two weeks. UPDATE- Patient expressed to her counselor about her ongoing issues with her sleep and he informed her to tell her psychiatrist. [facility] LCSW sent message to her psychiatrist with this medication dosage adjustment request. [facility] LCSW educated patient on healthy sleep hygiene. Patient denies any current crises. Patient is agreeable to contact  [facility] herself to inquire about her ongoing sleep issues.   Patient Goals/Self-Care Activities: Over the next 120 days Attend scheduled medical appointments Utilize healthy coping skills and supportive resources discussed Contact PCP with any questions or concerns Keep 90 percent of counseling appointments Call your insurance provider for more information about your Enhanced Benefits  Check out counseling resources provided  Begin personal counseling with LCSW, to reduce and manage symptoms of Depression and Stress, until well-established with mental health provider Accept all calls from representative with [facility] in an effort to establish ongoing mental health counseling and supportive services. Incorporate into daily practice - relaxation techniques, deep  breathing exercises, and mindfulness meditation strategies. Talk about feelings with friends, family members, spiritual advisor, etc. Contact LCSW directly [phone]), if you have questions, need assistance, or if additional social work needs are identified between now and our next scheduled telephone outreach call. Call [phone] for mental health hotline/crisis line if needed (24/7 available) Try techniques to reduce symptoms of anxiety/negative thinking (deep breathing, distraction, positive self talk, etc)  - develop a personal safety plan - develop a plan to deal with triggers like holidays, anniversaries - exercise at least 2 to 3 times per week - have a plan for how to handle bad days - journal feelings and what helps to feel better or worse - spend time or talk with others at least 2 to 3 times per week - watch for early signs of feeling worse - begin personal counseling - call and visit an old friend - check out volunteer opportunities - join a support group - laugh; watch a funny movie or comedian - learn and use visualization or guided imagery - perform a random act of kindness - practice relaxation or meditation daily -  start or continue a personal journal - practice positive thinking and self-talk -continue with compliance of taking medication        05/21/2022   12:53 PM 04/17/2022    3:17 PM 11/14/2021    4:02 PM 08/21/2021    4:33 PM 12/10/2020    4:00 PM  Depression screen PHQ 2/9  Decreased Interest '2 2 1 ' 0 1  Down, Depressed, Hopeless '2 2 1 ' 0 0  PHQ - 2 Score '4 4 2 ' 0 1  Altered sleeping '3 3 3   1  ' Tired, decreased energy '3 3 1   1  ' Change in appetite '3 3 1   1  ' Feeling bad or failure about yourself  0 1 0   0  Trouble concentrating 0 0 0   0  Moving slowly or fidgety/restless 0   0   0  Suicidal thoughts 0 0 0   0  PHQ-9 Score '13 14 7   4  ' Difficult doing work/chores Somewhat difficult Somewhat difficult Not difficult at all   Not difficult at all        24- Hour Availability:    [facility]  [address] [place], [place] [facility] [phone]   [facility] [employer] [phone]   [place]  [phone]    [place]  [phone] (after hours)   Therapeutic Alternative/Mobile Crisis   [phone]   [place] National Suicide Hotline  254-683-9548 (TALK) OR 988   Call 911 or go to emergency room   [facility]  [phone]);  [place] and [employer]  [phone]); [place], [place], [place], [place], [place], [place], [place]           Follow up:  Patient agrees to Care Plan and Follow-up.  Plan: The Managed Medicaid care management team will reach out to the patient again over the next 30 days.  Date/time of next scheduled Social Work care management/care coordination outreach:  10/14/22 at 11 am  [name], BSW, MSW, [place] Medicaid LCSW [email or web address] Phone: [phone]

## 2022-09-19 ENCOUNTER — Ambulatory Visit: Payer: Medicaid Other

## 2022-09-23 ENCOUNTER — Other Ambulatory Visit: Payer: Self-pay

## 2022-09-23 MED ORDER — METFORMIN HCL ER 500 MG PO TB24: 500.0000 mg | ORAL_TABLET | Freq: Two times a day (BID) | ORAL | 2 refills | Status: DC

## 2022-09-23 NOTE — Telephone Encounter (Signed)
Patient called in stating she has not received these orders. She will contact [employer] and ask to speak with Andee Poles [phone]).

## 2022-09-28 DIAGNOSIS — Z419 Encounter for procedure for purposes other than remedying health state, unspecified: Secondary | ICD-10-CM | POA: Diagnosis not present

## 2022-09-30 ENCOUNTER — Telehealth (HOSPITAL_COMMUNITY): Payer: Self-pay | Admitting: Licensed Clinical Social Worker

## 2022-09-30 ENCOUNTER — Ambulatory Visit (INDEPENDENT_AMBULATORY_CARE_PROVIDER_SITE_OTHER): Payer: Medicaid Other | Admitting: Licensed Clinical Social Worker

## 2022-09-30 DIAGNOSIS — F331 Major depressive disorder, recurrent, moderate: Secondary | ICD-10-CM

## 2022-09-30 DIAGNOSIS — F411 Generalized anxiety disorder: Secondary | ICD-10-CM | POA: Diagnosis not present

## 2022-09-30 NOTE — Telephone Encounter (Signed)
LCSW recd message from nursing staff pt was waiting on PC from provider. LCSW called again and phone rang once and then disconnected

## 2022-09-30 NOTE — Telephone Encounter (Signed)
LCSW called pt three times for PC appointment at 4pm. All calls went straight to VM.
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
## 2022-10-06 ENCOUNTER — Telehealth: Payer: Self-pay

## 2022-10-06 ENCOUNTER — Other Ambulatory Visit: Payer: Self-pay | Admitting: Obstetrics and Gynecology

## 2022-10-06 DIAGNOSIS — M4316 Spondylolisthesis, lumbar region: Secondary | ICD-10-CM

## 2022-10-06 NOTE — Patient Outreach (Signed)
Medicaid Managed Care   Nurse Care Manager Note  10/06/2022 Name:  [name] MRN:  [id number] DOB:  13-Dec-1961  [name] is an 61 y.o. year old female who is a primary patient of [name] Dame, MD.  The [facility] Managed Care Coordination team was consulted for assistance with:    Chronic healthcare management needs, HTN, DM, HF, asthma, COPD, OSA, anxiety/depression/PTSD, arthritis, GERD, CAD, HLD, h/o breast cancer  Ms. [name] was given information about Medicaid Managed Care Coordination team services today. [name] Patient agreed to services and verbal consent obtained.  Engaged with patient by telephone for follow up visit in response to provider referral for case management and/or care coordination services.   Assessments/Interventions:  Review of past medical history, allergies, medications, health status, including review of consultants reports, laboratory and other test data, was performed as part of comprehensive evaluation and provision of chronic care management services.  SDOH (Social Determinants of Health) assessments and interventions performed: SDOH Interventions    Flowsheet Row Patient Outreach Telephone from 10/06/2022 in [place] Counselor from 09/09/2022 in [facility] Patient Outreach Telephone from 09/05/2022 in [place] Coordination Office Visit from 08/12/2022 in [facility] Patient Outreach Telephone from 08/01/2022 in [place] from 06/24/2022 in [facility]  SDOH Interventions        Transportation Interventions Intervention Not Indicated -- -- -- -- --  Utilities Interventions Intervention Not Indicated -- -- -- -- --  Depression Interventions/Treatment  -- Currently on Treatment Currently on Treatment, Counseling, Medication  Currently on Treatment -- --  Stress Interventions -- -- [employer], Provide Counseling -- Live Life Well, [place]  No Known Allergies  Medications Reviewed Today     Reviewed by [name] Medicus, RN (Registered Nurse) on 10/06/22 at [place] [facility] Status: <None>   Medication Order Taking? Sig Documenting Provider Last Dose Status Informant  Accu-Chek FastClix Lancets MISC [id number] No Check blood sugar two times a day [name] Nimrod, MD Taking Active Self  albuterol (PROVENTIL) (2.5 MG/3ML) 0.083% nebulizer solution [id number]  USE 1 VIAL IN NEBULIZER EVERY 4 HOURS AS NEEDED FOR WHEEZING FOR SHORTNESS OF Loyal Gambler, MD  Active   amlodipine-atorvastatin (CADUET) 5-80 MG tablet [id number] No Take 1 tablet by mouth daily. [name] Dame, MD 03/06/2022 0730 Active Self  Ascorbic Acid (VITAMIN C) 1000 MG tablet [id number] No Take 1,000 mg by mouth daily. [provider] Past Month Active Self  ASPIRIN ADULT LOW STRENGTH 81 MG EC tablet [id number] No TAKE 1 Tablet BY MOUTH ONCE DAILY [name] Nimrod, MD 02/27/2022 Active Self  B-D UF III MINI PEN NEEDLES 31G X 5 MM MISC [id number]  USE AS DIRECTED DAILY [name] Pou, MD  Active   Blood Glucose Monitoring Suppl (ACCU-CHEK GUIDE) w/Device KIT [id number] No 1 each by Does not apply route 2 (two) times daily. [name] Nimrod, MD Taking Active Self  busPIRone (BUSPAR) 10 MG tablet [id number]  Take 1 tablet (10 mg total) by mouth 3 (three) times daily. [name] Slaughter, NP  Active   citalopram (CELEXA) 20 MG tablet [id number]  TAKE 1 TABLET BY MOUTH ONCE DAILY. DO NOT TAKE WHILE TAKING DULOXETINE [name] E, NP  Active   clopidogrel (PLAVIX) 75 MG tablet [id number] No TAKE 1 TABLET BY MOUTH IN  THE [name] Conception, MD 02/27/2022 Active Self  Continuous Blood Gluc Sensor (FREESTYLE LIBRE 3 SENSOR) MISC [id number]  Place 1 sensor on the  skin every 14 days. Use to check glucose continuously [name] Dame, MD  Active   Dulaglutide (TRULICITY) 3 BH/4.1PF SOPN [id number]  Inject 3 mg as directed once a week. [name] Dame, MD  Active   Evolocumab [facility] SURECLICK) 532 MG/ML [name] [id number]  INJECT 1 PEN INTO THE SKIN EVERY 14 DAYS Burnell Blanks, MD  Active   exemestane (AROMASIN) 25 MG tablet [id number] No Take 1 tablet (25 mg total) by mouth daily after breakfast. [name], MD 03/06/2022 0730 Active Self  ezetimibe (ZETIA) 10 MG tablet [id number] No Take 1 tablet (10 mg total) by mouth daily. [name], PA-C 03/06/2022 0730 Expired 05/26/22 2359   ferrous sulfate 325 (65 FE) MG tablet [id number]  Take 1 tablet (325 mg total) by mouth every other day. [name] Axe, MD  Active   fexofenadine [facility]) 180 MG tablet [id number]  Take 1 tablet (180 mg total) by mouth daily. [name] Oxford Scales, PA-C  Active   fluticasone (FLONASE) 50 MCG/ACT nasal spray [id number]  Place 1 spray into both nostrils daily. Begin by using 2 sprays in each nare daily for 3 to 5 days, then decrease to 1 spray in each nare daily. [name] Oxford Scales, PA-C  Active   Discontinued 05/09/20 1429   Fluticasone-Umeclidin-Vilant (TRELEGY ELLIPTA) 100-62.5-25 MCG/ACT AEPB [id number] No INHALE 1 PUFF ONCE DAILY Strength: 100-62.5-25 MCG/ACT Lacinda Axon, MD 03/06/2022 0730 Active Self  furosemide (LASIX) 40 MG tablet [id number]  Take 1 tablet (40 mg total) by mouth as needed for fluid or edema. [name] Clan, DO  Active   gabapentin (NEURONTIN) 300 MG capsule [id number]  Take 2 capsules (600 mg total) by mouth in the morning AND 3 capsules (900 mg total) at bedtime. [name] Dame, MD  Active   glucose blood (ACCU-CHEK GUIDE) test strip [id number] No Check blood sugar 2 times per day [name] Dame, MD Taking Active Self  insulin glargine-yfgn (SEMGLEE) 100 UNIT/ML Pen [id number]  Inject 30 Units into the skin at bedtime. [name] Axe, MD  Active   losartan-hydrochlorothiazide [facility]) 100-25 MG tablet [id number] No Take 1 tablet by mouth daily. [name] Dame, MD 03/05/2022 Active Self  metFORMIN (GLUCOPHAGE-XR) 500 MG 24 hr tablet [id number]  Take 1 tablet (500 mg total) by mouth 2 (two) times daily. [name] Dame, MD  Active   methocarbamol (ROBAXIN) 500 MG tablet [id number]  Take 1 tablet (500 mg total) by mouth every 6 (six) hours as needed for muscle spasms. [name], MD  Active   metoprolol succinate (TOPROL XL) 50 MG 24 hr tablet [id number] No Take 1 tablet (50 mg total) by mouth daily. Take with or immediately following a meal. [name] Dame, MD 03/06/2022 0730 Active Self  montelukast (SINGULAIR) 10 MG tablet [id number]  Take 1 tablet (10 mg total) by mouth at bedtime. [name] Dame, MD  Active   Potassium 99 MG TABS [id number] No Take 99 mg by mouth daily. [provider] Past Month Active Self  senna-docusate (SENNA-TIME S) 8.6-50 MG tablet [id number]  Take 1 tablet by mouth at bedtime as needed for mild constipation. [name] Axe, MD  Active   traMADol Veatrice [facility]bon) 50 MG tablet [id number]  Take 1 tablet (50 mg total) by mouth every 6 (six) hours as needed. Hazel Sams, PA-C  Active   traZODone (DESYREL) 50 MG tablet [id number]  Take  0.5-1 tablets (25-50 mg total) by mouth at bedtime. [name] Slaughter, NP  Active   VENTOLIN HFA 108 (90 Base) MCG/ACT inhaler [id number]  INHALE 2 PUFFS BY MOUTH EVERY 6 HOURS AS NEEDED FOR WHEEZING FOR SHORTNESS OF Loyal Gambler, MD  Active            Patient Active Problem List   Diagnosis Date Noted   Diabetic neuropathy ([place]) 08/29/2022   Vaginal lesion 08/29/2022   PTSD (post-traumatic stress disorder) 06/24/2022   GAD (generalized anxiety disorder) 06/24/2022   Major depressive disorder, recurrent episode, moderate ([place]) 06/24/2022   Urinary incontinence 06/20/2022   Intertrigo 06/20/2022   Arthritis of left shoulder region    S/P  reverse total shoulder arthroplasty, 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   Healthcare maintenance 11/17/2021   Spondylolisthesis, lumbar region 02/11/2021    Class: Acute   Malignant neoplasm of upper-outer quadrant of right breast in female, estrogen receptor positive ([place]) 06/10/2018   Restless leg syndrome 02/04/2018   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   Depression 08/06/2015   Hypertension associated with diabetes ([place])    Aortic stenosis    Chronic diastolic congestive heart failure (HCC)    COPD (chronic obstructive pulmonary disease) ([place]) 09/21/2013   Obstructive sleep apnea 09/21/2013   Nocturnal hypoxemia 09/12/2013   Hyperlipidemia 11/30/2012   History of pulmonary embolus (PE) 07/09/2012   Normocytic anemia 02/08/2012   Type 2 diabetes mellitus without complication, without long-term current use of insulin ([place]) 02/08/2012   Asthma 02/08/2012   GERD (gastroesophageal reflux disease)    CAD (coronary artery disease) 12/08/2011   Conditions to be addressed/monitored per PCP order:  Chronic healthcare management needs, HTN, DM, HF, asthma, COPD, OSA, anxiety/depression/PTSD, arthritis, GERD, CAD, HLD, h/o breast cancer, RLS  Care Plan : RN Care Manager Plan of Care  Updates made by [name] Medicus, RN since 10/06/2022 12:00 AM     Problem: Health Promotion or Disease Self-Management (General Plan of Care)   Priority: High  Onset Date: 04/14/2022     Long-Range Goal: Chronic Disease Management and Care Coordination Needs   Start Date: 04/14/2022  Expected End Date: 10/25/2022  Recent Progress: Not on track  Priority: High  Note:   Current Barriers:  Knowledge Deficits related to plan of care for management of Anxiety and Depression along with other chronic health conditions, HTN, DM Chronic Disease Management support and education needs related to Anxiety and Depression. DM, HTN 10/06/22:  Patient awaiting GYN referral and approval for motorized wheelchair and lift chair.   Continues to be followed by Psychiatry.  BP and BG WNL.  RNCM Clinical Goal(s):  Patient will verbalize understanding of plan for management of Anxiety and Depression, DM, HTN  as evidenced by patient report take all medications exactly as prescribed and will call provider for medication related questions as evidenced by patient report demonstrate understanding of rationale for each prescribed medication as evidenced by patient report attend all scheduled medical appointments  as evidenced by patient report continue to work with RN Care Manager to address care management and care coordination needs related to  Anxiety and Depression, DM, HTN as evidenced by adherence to CM Team Scheduled appointments work with Education officer, museum to address  related to the management of Mental Health Concerns  related to the management of Anxiety and Depression as evidenced by review of EMR and patient or social worker report through collaboration with Consulting civil engineer, provider, and care team.   Interventions: Inter-disciplinary care team collaboration (see  longitudinal plan of care) Evaluation of current treatment plan related to  self management and patient's adherence to plan as established by provider   (Status:  New goal.)  Long Term Goal Evaluation of current treatment plan related to Anxiety and Depression, Mental Health Concerns  self-management and patient's adherence to plan as established by provider. Discussed plans with patient for ongoing care management follow up and provided patient with direct contact information for care management team Advised patient to contact provider for any additional DME needs, CBG, and possible general surgeon referral. Reviewed medications with patient Collaborated with LCSW regarding anxiety/depression Reviewed scheduled/upcoming provider appointments Advised patient, providing education and rationale, to check cbg and record, calling provider  for findings outside  established parameters  Social Work referral for anxiety/depression Discussed plans with patient for ongoing care management follow up and provided patient with direct contact information for care management team Assessed social determinant of health barriers  Diabetes Interventions:  (Status:  New goal.) Long Term Goal Assessed patient's understanding of A1c goal: <7% Reviewed medications with patient and discussed importance of medication adherence Reviewed scheduled/upcoming provider appointments  Advised patient, providing education and rationale, to check cbg and record, calling provider for findings outside established parameters Review of patient status, including review of consultants reports, relevant laboratory and other test results, and medications completed Assessed social determinant of health barriers Lab Results  Component Value Date   HGBA1C 7.1 (A) 08/27/2022    Hypertension Interventions:  (Status:  New goal.) Long Term Goal Last practice recorded BP readings:  BP Readings from Last 3 Encounters:  09/12/22 133/69  08/27/22 (!) 109/58  07/01/22 137/75   Most recent eGFR/CrCl:  Lab Results  Component Value Date   EGFR 90 06/04/2022    No components found for: "CRCL"  Evaluation of current treatment plan related to hypertension self management and patient's adherence to plan as established by provider Reviewed medications with patient and discussed importance of compliance Discussed plans with patient for ongoing care management follow up and provided patient with direct contact information for care management team Advised patient, providing education and rationale, to monitor blood pressure daily and record, calling PCP for findings outside established parameters Reviewed scheduled/upcoming provider appointments including:  Assessed social determinant of health barriers   Patient Goals/Self-Care Activities: Take all medications as prescribed Attend all  scheduled provider appointments Call pharmacy for medication refills 3-7 days in advance of running out of medications Perform all self care activities independently  Perform IADL's (shopping, preparing meals, housekeeping, managing finances) independently Call provider office for new concerns or questions  Work with the social worker to address care coordination needs and will continue to work with the clinical team to address health care and disease management related needs  Follow Up Plan:  The patient has been provided with contact information for the care management team and has been advised to call with any health related questions or concerns.  The care management team will reach out to the patient again over the next 30 business  days.    Long-Range Goal: Establish Plan of Care for Chronic Disease Management Needs   Start Date: 04/14/2022  Expected End Date: 07/14/2022  Priority: High  Note:   Timeframe:  Long-Range Goal Priority:  High Start Date:      04/14/22                       Expected End Date:     ongoing  Follow Up Date 11/05/22   - schedule appointment for flu shot - schedule appointment for vaccines needed due to my age or health - schedule recommended health tests (blood work, mammogram, colonoscopy, pap test) - schedule and keep appointment for annual check-up    Why is this important?   Screening tests can find diseases early when they are easier to treat.  Your doctor or nurse will talk with you about which tests are important for you.  Getting shots for common diseases like the flu and shingles will help prevent them.  10/06/22:  patient seen and evaluated by PCP 9/7-awaiting GYN referral.  Followed by Psychiatry-next appt 11/9   Follow Up:  Patient agrees to Care Plan and Follow-up.  Plan: The Managed Medicaid care management team will reach out to the patient again over the next 30 business  days. and The  Patient has been provided with contact  information for the Managed Medicaid care management team and has been advised to call with any health related questions or concerns.  Date/time of next scheduled RN care management/care coordination outreach:  11/05/22 at 1230.

## 2022-10-06 NOTE — Telephone Encounter (Signed)
Patient called regarding a lift chair and a motorized scooter, per patient [employer] told her they are not in network with [facility]. Patient is requesting a new referral to be placed so she can receive DME.

## 2022-10-06 NOTE — Telephone Encounter (Signed)
Patient called regarding a referral for a gynecologist, patient stated she hasn't heard from anyone.

## 2022-10-06 NOTE — Patient Outreach (Deleted)
  Medicaid Managed Care   Unsuccessful Outreach Note  10/06/2022 Name: [name] MRN: [id number] DOB: [birth year]  Referred by: [name], MD Reason for referral : High Risk Managed Medicaid (Unsuccessful telephone outreach)  A second unsuccessful telephone outreach was attempted today. The patient was referred to the case management team for assistance with care management and care coordination.   Follow Up Plan: The care management team will reach out to the patient again over the next 30 business  days.   Aida Raider RN, BSN [employer]  [facility] - Managed Medicaid High Risk [phone]

## 2022-10-06 NOTE — Patient Instructions (Addendum)
Hi [name], glad we had a chance to speak today-please follow up on the things we discussed-GYN referral and DME.  [name] was given information about Medicaid Managed Care team care coordination services as a part of their [facility] Medicaid benefit. [name] verbally consented to engagement with the [facility] Managed Care team.   If you are experiencing a medical emergency, please call 911 or report to your local emergency department or urgent care.   If you have a non-emergency medical problem during routine business hours, please contact your provider's office and ask to speak with a nurse.   For questions related to your [facility] health plan, please call: [phone] or go here:[email or web address]  If you would like to schedule transportation through your [facility] plan, please call the following number at least 2 days in advance of your appointment: [phone].  You can also use the MTM portal or MTM mobile app to manage your rides. For the portal, please go to [email or web address].cy.  Call the [place] at [phone], at any time, 24 hours a day, 7 days a week. If you are in danger or need immediate medical attention call 911.  If you would like help to quit smoking, call 1-800-QUIT-NOW (815)769-2101) OR Espaol: 1-855-Djelo-Ya (5-400-867-6195) o para ms informacin haga clic aqu or Text READY to 200-400 to register via text  [name] - following are the goals we discussed in your visit today:   Goals Addressed    Timeframe:  Long-Range Goal Priority:  High Start Date:      04/14/22                       Expected End Date:     ongoing                  Follow Up Date 11/05/22   - schedule appointment for flu shot - schedule appointment for vaccines needed due to my age or health - schedule recommended health tests (blood work, mammogram, colonoscopy, pap test) - schedule and keep appointment for annual check-up    Patient verbalizes understanding of instructions and care plan provided today and agrees to view in [place]. Active MyChart status and patient understanding of how to access instructions and care plan via MyChart confirmed with patient.     The Managed Medicaid care management team will reach out to the patient again over the next 30 business  days.  The  Patient has been provided with contact information for the Managed Medicaid care management team and has been advised to call with any health related questions or concerns.   Aida Raider RN, BSN [place] Management Coordinator - Managed Medicaid High Risk [phone]   Following is a copy of your plan of care:  Care Plan : [place] of Care  Updates made by Gayla Medicus, RN since 10/06/2022 12:00 AM     Problem: Health Promotion or Disease Self-Management (General Plan of Care)   Priority: High  Onset Date: 04/14/2022     Long-Range Goal: Chronic Disease Management and Care Coordination Needs   Start Date: 04/14/2022  Expected End Date: 10/25/2022  Recent Progress: Not on track  Priority: High  Note:   Current Barriers:  Knowledge Deficits related to plan of care for management of Anxiety and Depression along with other chronic health conditions, HTN, DM Chronic Disease Management support and education needs related to Anxiety and  Depression. DM, HTN 10/06/22:  Patient awaiting GYN referral and approval for motorized wheelchair and lift chair.  Continues to be followed by Psychiatry.  BP and BG WNL.  RNCM Clinical Goal(s):  Patient will verbalize understanding of plan for management of Anxiety and Depression, DM, HTN  as evidenced by patient report take all medications exactly as prescribed and will call provider for medication related questions as evidenced by patient report demonstrate understanding of rationale for each prescribed medication as evidenced by patient report attend  all scheduled medical appointments  as evidenced by patient report continue to work with RN Care Manager to address care management and care coordination needs related to  Anxiety and Depression, DM, HTN as evidenced by adherence to CM Team Scheduled appointments work with Education officer, museum to address  related to the management of Mental Health Concerns  related to the management of Anxiety and Depression as evidenced by review of EMR and patient or Education officer, museum report through collaboration with Consulting civil engineer, provider, and care team.   Interventions: Inter-disciplinary care team collaboration (see longitudinal plan of care) Evaluation of current treatment plan related to  self management and patient's adherence to plan as established by provider   (Status:  New goal.)  Long Term Goal Evaluation of current treatment plan related to Anxiety and Depression, Mental Health Concerns  self-management and patient's adherence to plan as established by provider. Discussed plans with patient for ongoing care management follow up and provided patient with direct contact information for care management team Advised patient to contact provider for any additional DME needs, CBG, and possible general surgeon referral. Reviewed medications with patient Collaborated with LCSW regarding anxiety/depression Reviewed scheduled/upcoming provider appointments Advised patient, providing education and rationale, to check cbg and record, calling provider  for findings outside established parameters  Social Work referral for anxiety/depression Discussed plans with patient for ongoing care management follow up and provided patient with direct contact information for care management team Assessed social determinant of health barriers  Diabetes Interventions:  (Status:  New goal.) Long Term Goal Assessed patient's understanding of A1c goal: <7% Reviewed medications with patient and discussed importance of medication  adherence Reviewed scheduled/upcoming provider appointments  Advised patient, providing education and rationale, to check cbg and record, calling provider for findings outside established parameters Review of patient status, including review of consultants reports, relevant laboratory and other test results, and medications completed Assessed social determinant of health barriers Lab Results  Component Value Date   HGBA1C 7.1 (A) 08/27/2022    Hypertension Interventions:  (Status:  New goal.) Long Term Goal Last practice recorded BP readings:  BP Readings from Last 3 Encounters:  09/12/22 133/69  08/27/22 (!) 109/58  07/01/22 137/75   Most recent eGFR/CrCl:  Lab Results  Component Value Date   EGFR 90 06/04/2022    No components found for: "CRCL"  Evaluation of current treatment plan related to hypertension self management and patient's adherence to plan as established by provider Reviewed medications with patient and discussed importance of compliance Discussed plans with patient for ongoing care management follow up and provided patient with direct contact information for care management team Advised patient, providing education and rationale, to monitor blood pressure daily and record, calling PCP for findings outside established parameters Reviewed scheduled/upcoming provider appointments including:  Assessed social determinant of health barriers   Patient Goals/Self-Care Activities: Take all medications as prescribed Attend all scheduled provider appointments Call pharmacy for medication refills 3-7 days in advance of running out  of medications Perform all self care activities independently  Perform IADL's (shopping, preparing meals, housekeeping, managing finances) independently Call provider office for new concerns or questions  Work with the social worker to address care coordination needs and will continue to work with the clinical team to address health care and disease  management related needs  Follow Up Plan:  The patient has been provided with contact information for the care management team and has been advised to call with any health related questions or concerns.  The care management team will reach out to the patient again over the next 30 business  days.

## 2022-10-07 NOTE — Telephone Encounter (Signed)
Patient called back. She is advised to contact her insurance company to see which vendor these orders should go to.

## 2022-10-07 NOTE — Telephone Encounter (Signed)
Chilon it looks like it was placed in the WQ but don't see any further documentation? Thank you!

## 2022-10-07 NOTE — Telephone Encounter (Signed)
Returned call to patient. No answer. Left message on VM requesting return call.  °

## 2022-10-07 NOTE — Telephone Encounter (Signed)
DME orders placed. Mamie and Lauren please let me know if there is anything else that needs to be placed. Thanks

## 2022-10-08 ENCOUNTER — Ambulatory Visit (INDEPENDENT_AMBULATORY_CARE_PROVIDER_SITE_OTHER): Payer: Medicaid Other | Admitting: Obstetrics & Gynecology

## 2022-10-08 ENCOUNTER — Encounter: Payer: Self-pay | Admitting: Obstetrics & Gynecology

## 2022-10-08 ENCOUNTER — Other Ambulatory Visit: Payer: Self-pay

## 2022-10-08 VITALS — BP 117/70 | HR 87 | Wt 305.2 lb

## 2022-10-08 DIAGNOSIS — N898 Other specified noninflammatory disorders of vagina: Secondary | ICD-10-CM

## 2022-10-08 DIAGNOSIS — Z8742 Personal history of other diseases of the female genital tract: Secondary | ICD-10-CM

## 2022-10-08 NOTE — Progress Notes (Signed)
GYNECOLOGY OFFICE VISIT NOTE  History:   [name] is a 61 y.o. G3P0010 with very complex medical history here today for evaluation of vaginal lesion noted during pelvic exam by her PCP on 08/27/22 visit.  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  She denies any abnormal vaginal discharge, bleeding, pelvic pain or other concerns.    Past Medical History:  Diagnosis Date   Anemia    Anxiety    Aortic valve stenosis, severe    Arthritis    PAIN AND SEVERE OA LEFT KNEE ; S/P RIGHT TKA ON 02/03/12; HAS LOWER BACK PAIN-UNABLE TO STAND MORE THAN 10 MIN; ARTHRITIS "ALL OVER"   Asthma    Blood transfusion    [facility]   Breast cancer in female [facility])    Right   CAD (coronary artery disease)    Cath 2010 with DES x 1 RCA-- PT'S CARDIOLOGIST IS DR. MCALHANY   Chronic diastolic congestive heart failure (HCC)    COPD (chronic obstructive pulmonary disease) (HCC)    Depression    Diabetes mellitus DIAGNOSED IN2010   Dyspnea    with much ambulation   Eczema    on back   Headache    migraines younger- rare now 02/07/21   Heart murmur    no current problems   History of hiatal hernia    History of kidney stones    passed or blasted   Hyperlipidemia    Hypertension    Morbid obesity with body mass index of 60.0-69.9 in adult [facility])    Myocardial infarction ([place])    PT THINKS SHE WAS DX WITH MI AT THE TIME OF HEART STENTING   Neuromuscular disorder (HCC)    bilateral arm/hands   Oxygen dependent    uses 3L oxygen night/prn   Personal history of radiation therapy    Pneumonia    Pulmonary embolism ([place]) 02/08/2012   S/P RT TOTAL KNEE ON 02/03/12--ON 02/08/12--DEVELOPED ACUTE SOB AND CHEST PAIN--AND DIAGNOSED WITH  PULMONARY EMBOLUS AND PNEUMONIA   Restless leg syndrome    Sleep apnea    uses 3 liters O2 at night as needed   Uterine fibroid    NO PROBLEMS AT PRESENT FROM THE FIBROIDS-STATES SHE IS POST  MENOPAUSAL-LAST MENSES 2010 EXCEPT FOR EPISODE THIS YR OF BLEEDING RELATED TO FIBROIDS.   Weakness    BOTH HANDS - S/P BILATERAL CARPAL TUNNEL RELEASE--BUT STILL HAS WEAKNESS--OFTEN DROPS THINGS    Past Surgical History:  Procedure Laterality Date   BACK SURGERY  02/11/2021   Dr Louanne Skye   BREAST BIOPSY Right 06/04/2018   BREAST LUMPECTOMY Right 06/2018   BREAST LUMPECTOMY WITH RADIOACTIVE SEED AND SENTINEL LYMPH NODE BIOPSY Right 07/19/2018   Procedure: BREAST LUMPECTOMY WITH RADIOACTIVE SEED AND SENTINEL LYMPH NODE BIOPSY;  Surgeon: Alphonsa Overall, MD;  Location: [place];  Service: General;  Laterality: Right;   CARDIAC CATHETERIZATION     CARDIAC VALVE REPLACEMENT     2017   CARPAL TUNNEL RELEASE     Bilateral   CHOLECYSTECTOMY     COLONOSCOPY     CORONARY ANGIOPLASTY     2010 has stent in place   CYSTOSCOPY W/ RETROGRADES Right 09/21/2013   Procedure: CYSTOSCOPY WITH RIGHT RETROGRADE PYELOGRAM RIGHT DOUBLE J STENT ;  Surgeon: Fredricka Bonine, MD;  Location[facility];  Service: Urology;  Laterality: Right;   CYSTOSCOPY WITH URETEROSCOPY AND STENT PLACEMENT  Right 10/25/2013   Procedure: CYSTOSCOPY RIGHT URETEROSCOPY HOLMIUM LASER LITHO AND STENT PLACEMENT;  Surgeon: Fredricka Bonine, MD;  Location: [facility];  Service: Urology;  Laterality: Right;   HERNIA REPAIR     INTRAOPERATIVE TRANSESOPHAGEAL ECHOCARDIOGRAM N/A 12/12/2014   Procedure: INTRAOPERATIVE TRANSESOPHAGEAL ECHOCARDIOGRAM;  Surgeon: Burnell Blanks, MD;  Location: [place];  Service: Cardiovascular;  Laterality: N/A;   JOINT REPLACEMENT     bil total knees   KNEE ARTHROPLASTY  02/03/2012   Procedure: COMPUTER ASSISTED TOTAL KNEE ARTHROPLASTY;  Surgeon: Mcarthur Rossetti, MD;  Location: [place];  Service: Orthopedics;  Laterality: Right;  Right total knee arthroplasty   LEFT AND RIGHT HEART CATHETERIZATION WITH CORONARY ANGIOGRAM N/A 03/17/2013   Procedure: LEFT AND RIGHT HEART CATHETERIZATION WITH CORONARY  ANGIOGRAM;  Surgeon: Burnell Blanks, MD;  Location[facility] C[facility] LAB;  Service: Cardiovascular;  Laterality: N/A;   LEFT AND RIGHT HEART CATHETERIZATION WITH CORONARY/GRAFT ANGIOGRAM N/A 09/14/2014   Procedure: LEFT AND RIGHT HEART CATHETERIZATION WITH Beatrix Fetters;  Surgeon: Burnell Blanks, MD;  Location: [facility] C[facility] LAB;  Service: Cardiovascular;  Laterality: N/A;   REVERSE SHOULDER ARTHROPLASTY Left 03/06/2022   Procedure: LEFT SHOULDER REPLACEMENT APPLICATION OF WOUND VAC;  Surgeon: [name], MD;  Location: [place];  Service: Orthopedics;  Laterality: Left;   TEE WITHOUT CARDIOVERSION N/A 03/14/2013   Procedure: TRANSESOPHAGEAL ECHOCARDIOGRAM (TEE);  Surgeon: Lelon Perla, MD;  Location: [facility] ENDOSCOPY;  Service: Cardiovascular;  Laterality: N/A;   TEE WITHOUT CARDIOVERSION N/A 11/14/2014   Procedure: TRANSESOPHAGEAL ECHOCARDIOGRAM (TEE);  Surgeon: Lelon Perla, MD;  Location: [facility] ENDOSCOPY;  Service: Cardiovascular;  Laterality: N/A;   TONSILLECTOMY     maybe as a child- does not know   TOTAL KNEE ARTHROPLASTY  09/10/2012   Procedure: TOTAL KNEE ARTHROPLASTY;  Surgeon: Mcarthur Rossetti, MD;  [facility];  Service: Orthopedics;  Laterality: Left;   TOTAL KNEE REVISION Right 07/15/2013   Procedure: REVISION ARTHROPLASTY RIGHT KNEE;  Surgeon: Mcarthur Rossetti, MD;  [facility];  Service: Orthopedics;  Laterality: Right;   TRANSCATHETER AORTIC VALVE REPLACEMENT, TRANSFEMORAL N/A 12/12/2014   Procedure: TRANSCATHETER AORTIC VALVE REPLACEMENT, TRANSFEMORAL;  Surgeon: Burnell Blanks, MD;  Location: [place];  Service: Cardiovascular;  Laterality: N/A;   TRIGGER FINGER RELEASE  09/10/2012   Procedure: RELEASE TRIGGER FINGER/A-1 PULLEY;  Surgeon: Mcarthur Rossetti, MD;  Location[facility];  Service: Orthopedics;  Laterality: Right;  Right Ring Finger   TUBAL LIGATION      The following portions of the patient's history were reviewed and  updated as appropriate: allergies, current medications, past family history, past medical history, past social history, past surgical history and problem list.   Health Maintenance:  Normal pap and negative HRHPV on 08/27/2022.  Normal mammogram on 09/18/2022.   Review of Systems:  Pertinent items noted in HPI and remainder of comprehensive ROS otherwise negative.  Physical Exam:  BP 117/70   Pulse 87   Wt (!) 305 lb 3.2 oz (138.4 kg)   LMP 02/03/2012   BMI 54.06 kg/m  CONSTITUTIONAL: Well-developed, well-nourished female in no acute distress.  HEENT:  Normocephalic, atraumatic. External right and left ear normal. No scleral icterus.  NECK: Normal range of motion, supple, no masses noted on observation SKIN: No rash noted. Not diaphoretic. No erythema. No pallor. MUSCULOSKELETAL: Normal range of motion. No edema noted. NEUROLOGIC: Alert and oriented to person, place, and time. Normal muscle tone coordination. No cranial nerve deficit noted. PSYCHIATRIC:  Normal mood and affect. Normal behavior. Normal judgment and thought content. CARDIOVASCULAR: Normal heart rate noted RESPIRATORY: Effort and breath sounds normal, no problems with respiration noted ABDOMEN: No masses noted. No other overt distention noted.   PELVIC: Normal appearing external genitalia and normal urethral meatus with mild diffuse atrophy. Normal appearing vaginal mucosa with pronounced ruggae noted and mild atrophy.  Normal cervix.  No abnormal discharge noted.  No lesions noted in her vagina or palpated, I feel the lesion noted by her PCP was a pronounced vaginal fold which on manual exam felt totally normal.  Performed in the presence of a chaperone     Assessment and Plan:     1. Vaginal lesion No concerning lesion seen, patient was reassured. No biopsy needed.  2. History of complex atypical endometrial hyperplasia No reported bleeding for several years. Had benign surveillance endometrial biopsy in 2019.  After her  diagnosis, she was deemed inoperable due to her medical history and started on Mirena IUD and Megace (oral progestin therapy). Patient states her Mirena IUD placed in December 2017 fell out about 2 months after placement and she did not take any pills after this as she had no bleeding.  As per Dr. Everitt Amber  (GYN Oncology) recommendations in 2019 notes (see Dr. Ilda Basset' notes from 05/03/2018), no need for further biopsies or screening ultrasounds as long as she remains asymptomatic. Also felt there was no need for progestin therapy unless needed for bleeding control. Of note, patient also has history of breast cancer and hormone therapy could be contraindicated. Will follow up with oncologist if this is needed in the future.   Routine preventative health maintenance measures emphasized. Please refer to After Visit Summary for other counseling recommendations.   Return for any gynecologic concerns.    I spent 30 minutes dedicated to the care of this patient including pre-visit review of records, face to face time with the patient discussing her conditions and treatments and post visit orders.    Verita Schneiders, MD, [place] for [employer], [place]

## 2022-10-14 ENCOUNTER — Other Ambulatory Visit: Payer: Self-pay | Admitting: Licensed Clinical Social Worker

## 2022-10-14 NOTE — Patient Instructions (Signed)
Visit Information  [name] was given information about Medicaid Managed Care team care coordination services as a part of their [facility] Medicaid benefit. [name] verbally consented to engagement with the [facility] Managed Care team.   If you are experiencing a medical emergency, please call 911 or report to your local emergency department or urgent care.   If you have a non-emergency medical problem during routine business hours, please contact your provider's office and ask to speak with a nurse.   For questions related to your [facility] health plan, please call: [phone] or go here:[email or web address]  If you would like to schedule transportation through your [facility] plan, please call the following number at least 2 days in advance of your appointment: [phone].  You can also use the MTM portal or MTM mobile app to manage your rides. For the portal, please go to [email or web address].  Call the [place] at [phone], at any time, 24 hours a day, 7 days a week. If you are in danger or need immediate medical attention call 911.  If you would like help to quit smoking, call 1-800-QUIT-NOW 365-790-0491) OR Espaol: 1-855-Djelo-Ya (4-270-623-7628) o para ms informacin haga clic aqu or Text READY to 200-400 to register via text    Following is a copy of your plan of care:  Care Plan : LCSW Plan of Care  Updates made by Greg Cutter, LCSW since 10/14/2022 12:00 AM     Problem: Depression Identification (Depression)      Long-Range Goal: Depressive Symptoms Identified   Start Date: 04/17/2022  Recent Progress: On track  Note:   Long-Range Goal: I want to start mental health treatment    Start Date: 04/17/2022  Priority: High  Note:   Priority: High   Timeframe:  Long-Range Goal Priority:  High Start Date:   04/17/22                Expected End Date:  ongoing                 Follow Up Date--11/07/22 at 11  am    - check out counseling - keep 90 percent of counseling appointments - schedule counseling appointment    Why is this important?             Beating depression may take some time.            If you don't feel better right away, don't give up on your treatment plan.     Current barriers:   Chronic Mental Health needs related to depression, stress and anxiety. Patient requires Support, Education, Resources, Referrals, Advocacy, and Care Coordination, in order to meet Unmet Mental Health Needs. Patient will implement clinical interventions discussed today to decrease symptoms of depression and increase knowledge and/or ability of: coping skills. Mental Health Concerns and Social Isolation Patient lacks knowledge of available community counseling agencies and resources.   Clinical Goal(s): verbalize understanding of plan for management of Anxiety, Depression, and Stress and demonstrate a reduction in symptoms. Patient will connect with a provider for ongoing mental health treatment, increase coping skills, healthy habits, self-management skills, and stress reduction        Patient Goals/Self-Care Activities: Over the next 120 days Attend scheduled medical appointments Utilize healthy coping skills and supportive resources discussed Contact PCP with any questions or concerns Keep 90 percent of counseling appointments Call your insurance provider for more information about your Enhanced Benefits  Check out counseling resources provided  Begin personal counseling with LCSW, to reduce and manage symptoms of Depression and Stress, until well-established with mental health provider Accept all calls from representative with [facility] in an effort to establish ongoing mental health counseling and supportive services. Incorporate into daily practice - relaxation techniques, deep breathing exercises, and mindfulness meditation strategies. Talk about feelings with friends, family members, spiritual  advisor, etc. Contact LCSW directly [phone]), if you have questions, need assistance, or if additional social work needs are identified between now and our next scheduled telephone outreach call. Call [phone] for mental health hotline/crisis line if needed (24/7 available) Try techniques to reduce symptoms of anxiety/negative thinking (deep breathing, distraction, positive self talk, etc)  - develop a personal safety plan - develop a plan to deal with triggers like holidays, anniversaries - exercise at least 2 to 3 times per week - have a plan for how to handle bad days - journal feelings and what helps to feel better or worse - spend time or talk with others at least 2 to 3 times per week - watch for early signs of feeling worse - begin personal counseling - call and visit an old friend - check out volunteer opportunities - join a support group - laugh; watch a funny movie or comedian - learn and use visualization or guided imagery - perform a random act of kindness - practice relaxation or meditation daily - start or continue a personal journal - practice positive thinking and self-talk -continue with compliance of taking medication        05/21/2022   12:53 PM 04/17/2022    3:17 PM 11/14/2021    4:02 PM 08/21/2021    4:33 PM 12/10/2020    4:00 PM  Depression screen PHQ 2/9  Decreased Interest '2 2 1 '$ 0 1  Down, Depressed, Hopeless '2 2 1 '$ 0 0  PHQ - 2 Score '4 4 2 '$ 0 1  Altered sleeping '3 3 3   1  '$ Tired, decreased energy '3 3 1   1  '$ Change in appetite '3 3 1   1  '$ Feeling bad or failure about yourself  0 1 0   0  Trouble concentrating 0 0 0   0  Moving slowly or fidgety/restless 0   0   0  Suicidal thoughts 0 0 0   0  PHQ-9 Score '13 14 7   4  '$ Difficult doing work/chores Somewhat difficult Somewhat difficult Not difficult at all   Not difficult at all        24- Hour Availability:    [facility]  [address] [place], [place]  [place] [facility] [phone]   [facility] [employer] [phone]   [place]  [phone]    [place]  [phone] (after hours)   Therapeutic Alternative/Mobile Crisis   [phone]   [place] National Suicide Hotline  910-778-9517 (TALK) OR 988   Call 911 or go to emergency room   [facility]  [phone]);  [place] and [employer]  [phone]); [place], [place], [place], [place], [place], [place], [place]

## 2022-10-14 NOTE — Patient Outreach (Signed)
Medicaid Managed Care Social Work Note  10/14/2022 Name:  [name] MRN:  [id number] DOB:  [birth year]  [name] is an 61 y.o. year old female who is a primary patient of [name], MD.  The [place] team was consulted for assistance with:  [place] and Resources  [name] was given information about Medicaid Managed Care Coordination team services today. [name] Patient agreed to services and verbal consent obtained.  Engaged with patient  for by telephone forfollow up visit in response to referral for case management and/or care coordination services.   Assessments/Interventions:  Review of past medical history, allergies, medications, health status, including review of consultants reports, laboratory and other test data, was performed as part of comprehensive evaluation and provision of chronic care management services.  SDOH: (Social Determinant of Health) assessments and interventions performed: SDOH Interventions    Flowsheet Row Patient Outreach Telephone from 10/14/2022 in [place] Patient Outreach Telephone from 10/06/2022 in [place] Patient Outreach Telephone from 09/05/2022 in [place] Patient Outreach Telephone from 08/01/2022 in [place] Patient Outreach Telephone from 06/12/2022 in [place] Coordination Office Visit from 06/04/2022 in [place]  SDOH Interventions        Transportation Interventions -- Intervention Not Indicated -- -- -- --  Utilities Interventions -- Intervention Not Indicated -- -- -- --  Depression Interventions/Treatment  -- -- Currently on Treatment, Counseling, Medication -- -- Medication, Counseling  Stress Interventions Offered [facility], Provide Counseling -- [place], Provide Counseling [facility], [place], Provide Counseling --       Advanced Directives Status:  See Care Plan for related entries.  Care Plan                 No Known Allergies  Medications Reviewed Today     Reviewed by Greg Cutter, LCSW (Social Worker) on 10/14/22 at 89  Med List Status: <None>   Medication Order Taking? Sig Documenting Provider Last Dose Status Informant  Accu-Chek FastClix Lancets MISC [id number] No Check blood sugar two times a day Lorella Nimrod, MD Taking Active Self  albuterol (PROVENTIL) (2.5 MG/3ML) 0.083% nebulizer solution [id number] No USE 1 VIAL IN NEBULIZER EVERY 4 HOURS AS NEEDED FOR WHEEZING FOR SHORTNESS OF Loyal Gambler, MD Taking Active   amlodipine-atorvastatin (CADUET) 5-80 MG tablet [id number] No Take 1 tablet by mouth daily. [name], MD Taking Active Self  Ascorbic Acid (VITAMIN C) 1000 MG tablet [id number] No Take 1,000 mg by mouth daily. [provider] Taking Active Self  ASPIRIN ADULT LOW STRENGTH 81 MG EC tablet [id number] No TAKE 1 Tablet BY MOUTH ONCE DAILY Lorella Nimrod, MD Taking Active Self  B-D UF III MINI PEN NEEDLES 31G X 5 MM MISC [id number] No USE AS DIRECTED DAILY Angelica Pou, MD Taking Active   Blood Glucose Monitoring Suppl (ACCU-CHEK GUIDE) w/Device KIT [id number] No 1 each by Does not apply route 2 (two) times daily. Lorella Nimrod, MD Taking Active Self  busPIRone (BUSPAR) 10 MG tablet [id number] No Take 1 tablet (10 mg total) by mouth 3 (three) times daily. Salley Slaughter, NP Taking Active   citalopram (CELEXA) 20 MG tablet [id number] No TAKE 1 TABLET BY MOUTH ONCE DAILY. DO NOT TAKE WHILE TAKING DULOXETINE Ronne Binning,  Brittney E, NP Taking Active   clopidogrel (PLAVIX) 75 MG tablet [id number] No TAKE 1 TABLET BY MOUTH IN THE [name], MD  Taking Active Self  Continuous Blood Gluc Sensor (FREESTYLE LIBRE 3 SENSOR) [place] [id number] No Place 1 sensor on the skin every 14 days. Use to check glucose continuously [name], MD Taking Active   Dulaglutide (TRULICITY) 3 BT/6.6MA SOPN [id number] No Inject 3 mg as directed once a week. [name], MD Taking Active   Evolocumab [facility] SURECLICK) 142 MG/ML [name] [id number] No INJECT 1 PEN INTO THE SKIN EVERY 14 DAYS Burnell Blanks, MD Taking Active   exemestane (AROMASIN) 25 MG tablet [id number] No Take 1 tablet (25 mg total) by mouth daily after breakfast. [name], MD Taking Active Self  ezetimibe (ZETIA) 10 MG tablet [id number] No Take 1 tablet (10 mg total) by mouth daily. [name], PA-C 03/06/2022 0730 Expired 05/26/22 2359   ferrous sulfate 325 (65 FE) MG tablet [id number] No Take 1 tablet (325 mg total) by mouth every other day. [name], MD Taking Active   fexofenadine [facility]) 180 MG tablet [id number] No Take 1 tablet (180 mg total) by mouth daily. [name], PA-C Taking Active   fluticasone (FLONASE) 50 MCG/ACT nasal spray [id number] No Place 1 spray into both nostrils daily. Begin by using 2 sprays in each nare daily for 3 to 5 days, then decrease to 1 spray in each nare daily. [name], PA-C Taking Active   Discontinued 05/09/20 1429   Fluticasone-Umeclidin-Vilant (TRELEGY ELLIPTA) 100-62.5-25 MCG/ACT AEPB [id number] No INHALE 1 PUFF ONCE DAILY Strength: 100-62.5-25 MCG/ACT Lacinda Axon, MD Taking Active Self  furosemide (LASIX) 40 MG tablet [id number] No Take 1 tablet (40 mg total) by mouth as needed for fluid or edema. Dorethea Clan, DO Taking Active   gabapentin (NEURONTIN) 300 MG capsule [id number] No Take 2 capsules (600 mg total) by mouth in the morning AND 3 capsules (900 mg total) at bedtime. [name], MD Taking Active   glucose blood (ACCU-CHEK GUIDE) test strip [id number] No Check blood sugar 2 times  per day [name], MD Taking Active Self  insulin glargine-yfgn (SEMGLEE) 100 UNIT/ML Pen [id number] No Inject 30 Units into the skin at bedtime. [name], MD Taking Active   losartan-hydrochlorothiazide [facility]) 100-25 MG tablet [id number] No Take 1 tablet by mouth daily. [name], MD Taking Active Self  metFORMIN (GLUCOPHAGE-XR) 500 MG 24 hr tablet [id number] No Take 1 tablet (500 mg total) by mouth 2 (two) times daily. [name], MD Taking Active   methocarbamol (ROBAXIN) 500 MG tablet [id number] No Take 1 tablet (500 mg total) by mouth every 6 (six) hours as needed for muscle spasms. [name], MD Taking Active   metoprolol succinate (TOPROL XL) 50 MG 24 hr tablet [id number] No Take 1 tablet (50 mg total) by mouth daily. Take with or immediately following a meal. [name], MD Taking Active Self  montelukast (SINGULAIR) 10 MG tablet [id number] No Take 1 tablet (10 mg total) by mouth at bedtime. [name], MD Taking Active   Potassium 99 MG TABS [id number] No Take 99 mg by mouth daily. [provider] Taking Active Self  senna-docusate (SENNA-TIME S) 8.6-50 MG tablet [id number] No Take 1 tablet by mouth at bedtime as needed for mild constipation. [name], MD Taking Active   traMADol Veatrice Bourbon) 50 MG tablet [id number] No Take 1 tablet (50 mg total) by mouth every 6 (six) hours as needed.  Hazel Sams, PA-C Taking Active   traZODone (DESYREL) 50 MG tablet [id number] No Take 0.5-1 tablets (25-50 mg total) by mouth at bedtime. Salley Slaughter, NP Taking Active   VENTOLIN HFA 108 (90 Base) MCG/ACT inhaler [id number] No INHALE 2 PUFFS BY MOUTH EVERY 6 HOURS AS NEEDED FOR WHEEZING FOR SHORTNESS OF Loyal Gambler, MD Taking Active             Patient Active Problem List   Diagnosis Date Noted   Diabetic neuropathy ([place]) 08/29/2022   Vaginal lesion 08/29/2022   PTSD (post-traumatic stress disorder) 06/24/2022   GAD  (generalized anxiety disorder) 06/24/2022   Major depressive disorder, recurrent episode, moderate ([place]) 06/24/2022   Urinary incontinence 06/20/2022   Intertrigo 06/20/2022   Arthritis of left shoulder region    S/P reverse total shoulder arthroplasty, 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   Healthcare maintenance 11/17/2021   Spondylolisthesis, lumbar region 02/11/2021    Class: Acute   Malignant neoplasm of upper-outer quadrant of right breast in female, estrogen receptor positive ([place]) 06/10/2018   Restless leg syndrome 02/04/2018   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   Depression 08/06/2015   Hypertension associated with diabetes ([place])    Aortic stenosis    Chronic diastolic congestive heart failure ([place])    COPD (chronic obstructive pulmonary disease) ([place]) 09/21/2013   Obstructive sleep apnea 09/21/2013   Nocturnal hypoxemia 09/12/2013   Hyperlipidemia 11/30/2012   History of pulmonary embolus ([place]) 07/09/2012   Normocytic anemia 02/08/2012   Type 2 diabetes mellitus without complication, without long-term current use of insulin ([place]) 02/08/2012   Asthma 02/08/2012   GERD (gastroesophageal reflux disease)    CAD (coronary artery disease) 12/08/2011    Conditions to be addressed/monitored per PCP order:  Depression  Care Plan : LCSW Plan of Care  Updates made by Greg Cutter, LCSW since 10/14/2022 12:00 AM     Problem: Depression Identification (Depression)      Long-Range Goal: Depressive Symptoms Identified   Start Date: 04/17/2022  Recent Progress: On track  Note:   Long-Range Goal: I want to start mental health treatment    Start Date: 04/17/2022  Priority: High  Note:   Priority: High   Timeframe:  Long-Range Goal Priority:  High Start Date:   04/17/22                Expected End Date:  ongoing                 Follow Up Date--11/07/22 at 11 am    - check out counseling - keep 90 percent of counseling appointments - schedule counseling appointment    Why  is this important?             Beating depression may take some time.            If you don't feel better right away, don't give up on your treatment plan.     Current barriers:   Chronic Mental Health needs related to depression, stress and anxiety. Patient requires Support, Education, Resources, Referrals, Advocacy, and Care Coordination, in order to meet Unmet Mental Health Needs. Patient will implement clinical interventions discussed today to decrease symptoms of depression and increase knowledge and/or ability of: coping skills. Mental Health Concerns and Social Isolation Patient lacks knowledge of available community counseling agencies and resources.   Clinical Goal(s): verbalize understanding of plan for management of Anxiety, Depression, and Stress and demonstrate a reduction in symptoms. Patient will connect with a  provider for ongoing mental health treatment, increase coping skills, healthy habits, self-management skills, and stress reduction        Clinical Interventions:  Assessed patient's previous and current treatment, coping skills, support system and barriers to care. Patient provided history. Patient and spouse lived in a shared home with two other adults. Patient reports that this is a current and stable living situation.  Verbalization of feelings encouraged, motivational interviewing employed Emotional support provided, positive coping strategies explored Self care/establishing healthy boundaries emphasized Patient reports that she takes medication for anxiety and depression. She reports that this has alleviated some of symptoms but not all. She reports that she still has crying spells. She needs help with finding psychiatry and counseling. She was successful in identifying triggers to anxiety and depression symptoms, in addition, to healthy coping skills.  Patient reports significant worsening anxiety and depression impacting her ability to function appropriately and carry  out daily task. Patient receives strong support from spouse but he does not have anxiety or depression and is unable to relate to her and her daily mental health struggles.  Patient is agreeable to referral to [facility] for counseling and psychiatry. [facility] LCSW made referral on 04/17/22. Patient will call [facility] tomorrow on 04/18/22 to schedule counseling and psychiatry appointments. Email sent to her with [facility]'s contact information.  LCSW provided education on relaxation techniques such as meditation, deep breathing, massage, grounding exercises or yoga that can activate the body's relaxation response and ease symptoms of stress and anxiety. LCSW ask that when pt is struggling with difficult emotions and racing thoughts that they start this relaxation response process. LCSW provided extensive education on healthy coping skills for anxiety. SW used active and reflective listening, validated patient's feelings/concerns, and provided emotional support. Patient will work on implementing appropriate self-care habits into their daily routine such as: staying positive, writing a gratitude list, drinking water, staying active around the house, taking their medications as prescribed, combating negative thoughts or emotions and staying connected with their family and friends. Positive reinforcement provided for this decision to work on this. Patient was receptive to anxiety and depression management coping skill education. She will try to increase her socialization over the next 90 days to decrease isolation.  Patient has problems with sleep disturbance. LCSW provided education on healthy sleep hygiene and what that looks like. LCSW encouraged patient to implement a night time routine into her schedule that works best for her and that she is able to maintain. Advised patient to implement deep breathing/grounding/meditation/self-care exercises into her nightly routine to combat racing thoughts at night. LCSW encouraged patient  to wake up at the same time each day, make their sleeping environment comfortable, exercise when able, to limit naps and to not eat or drink anything right before bed.   Motivational Interviewing employed Depression screen reviewed  PHQ2/ PHQ9 completed Mindfulness or Relaxation training provided Active listening / Reflection utilized  Advance Care and HCPOA education provided Emotional Support Provided Problem [place] strategies reviewed Provided psychoeducation for mental health needs  Provided brief CBT  Reviewed mental health medications and discussed importance of compliance:  Quality of sleep assessed & Sleep Hygiene techniques promoted  Participation in counseling encouraged  Verbalization of feelings encouraged  Suicidal Ideation/Homicidal Ideation assessed: Patient denies SI/HI  Review resources, discussed options and provided patient information about  [place] care team collaboration (see longitudinal plan of care) UPDATE 05/21/22- Patient was successfully set up with counseling at [facility] but was not set  up with psychiatry. She is in need of both services. Patient has an upcoming counseling session at [facility] on 06/24/22. [facility] LCSW completed joint call with patient to [facility] but was unable to reach anyone. [facility] LCSW left a voice message asking for them to return call to patient to get her scheduled with psychiatry as [facility] LCSW ask for both psychiatry and counseling in referral. [facility] LCSW ask if they needed an additional referral to please let [facility] LCSW know so this can be completed. [facility] LCSW sent patient an email with [facility]'s contact information including their address as well as a list of coping skills for depression and anxiety. Patient's spouse has multiple health conditions and tells patient that he "wishes to die" often which concerns her. [facility] LCSW provided emotional support and brief coping skill education on stress management.  UPDATE  06/12/22- Patient successfully contacted [facility] and set up appointments for both psychiatry and counseling. Patient is eager to start treatment. Brief self-care education provided to patient. Patient is agreeable to contact [facility] LCSW directly if needed. [facility] LCSW will make one last follow up on call to ensure that patient was successfully connected to a long term mental health provider. Patient denies any current crises or concerns.  UPDATE 07/18/22- Patient has not been set up with counseling at [facility] and she has completed her first counseling session but she has not been set up with psychiatry still. She has made several calls to agency but has been unsuccessful in reaching them. Patient has an in person appointment at [facility] on 07/30/22 and she will ask them to enroll her in psychiatry at that time. [facility] LCSW will follow up and continue to support patient as needed. UPDATE 08/01/22- Patient reports that she is doing extremely well and that her mood has lifted over the last few weeks. She reports that her first counselor appointment well very well and she was able to build rapport with him. She will see her counselor 2X per month. Her next appointment for therapy is on 08/20/22. Patient has her initial appointment with psychiatry is on the 08/12/22. UPDATE- Patient successfully completed her psychiatry appointment and has a follow up scheduled in two months. However, patient reports having ongoing issues with sleep even with the medication and reports needing this medication to be adjusted. Pt will discuss this concern with her therapist on 09/09/22 but was encouraged to call [facility] before then if needed to address this issue. [facility] LCSW will follow up in two weeks. UPDATE- Patient expressed to her counselor about her ongoing issues with her sleep and he informed her to tell her psychiatrist. [facility] LCSW sent message to her psychiatrist with this medication dosage adjustment request. [facility] LCSW educated patient on healthy sleep  hygiene. Patient denies any current crises. Patient is agreeable to contact [facility] herself to inquire about her ongoing sleep issues. UPDATE 10/17- Patient continues to struggle with ongoing sleep issues and has decided not to call her psychiatrist but wait until her scheduled appointment on 11/06/22 to address these concerns. [facility] LCSW suggested against this as patient will not been seen in over 4 weeks but patient reports that she can manage until then. Healthy sleep and hygiene techniques were provided to patient. Patient will continue to try as much healthy self-care into her daily routine to combat these negative symptoms. [facility] LCSW will follow up the day after her psychiatry appointment.  Patient Goals/Self-Care Activities: Over the next 120 days Attend scheduled medical appointments Utilize healthy coping skills and supportive  resources discussed Contact PCP with any questions or concerns Keep 90 percent of counseling appointments Call your insurance provider for more information about your Enhanced Benefits  Check out counseling resources provided  Begin personal counseling with LCSW, to reduce and manage symptoms of Depression and Stress, until well-established with mental health provider Accept all calls from representative with [facility] in an effort to establish ongoing mental health counseling and supportive services. Incorporate into daily practice - relaxation techniques, deep breathing exercises, and mindfulness meditation strategies. Talk about feelings with friends, family members, spiritual advisor, etc. Contact LCSW directly [phone]), if you have questions, need assistance, or if additional social work needs are identified between now and our next scheduled telephone outreach call. Call [phone] for mental health hotline/crisis line if needed (24/7 available) Try techniques to reduce symptoms of anxiety/negative thinking (deep breathing, distraction, positive self talk, etc)  - develop  a personal safety plan - develop a plan to deal with triggers like holidays, anniversaries - exercise at least 2 to 3 times per week - have a plan for how to handle bad days - journal feelings and what helps to feel better or worse - spend time or talk with others at least 2 to 3 times per week - watch for early signs of feeling worse - begin personal counseling - call and visit an old friend - check out volunteer opportunities - join a support group - laugh; watch a funny movie or comedian - learn and use visualization or guided imagery - perform a random act of kindness - practice relaxation or meditation daily - start or continue a personal journal - practice positive thinking and self-talk -continue with compliance of taking medication        05/21/2022   12:53 PM 04/17/2022    3:17 PM 11/14/2021    4:02 PM 08/21/2021    4:33 PM 12/10/2020    4:00 PM  Depression screen PHQ 2/9  Decreased Interest _0 0 1  Down, Depressed, Hopeless _1 0 0  PHQ - 2 Score _2 0 1  Altered sleeping _3 Tired, decreased energy _4 Change in appetite _5 Feeling bad or failure about yourself  0 1 0   0  Trouble concentrating 0 0 0   0  Moving slowly or fidgety/restless 0   0   0  Suicidal thoughts 0 0 0   0  PHQ-9 Score _6 Difficult doing work/chores Somewhat difficult Somewhat difficult Not difficult at all   Not difficult at all        24- Hour Availability:    [facility]  [address] [place], [place] Crisis [phone]   [facility] [employer] [phone]   [place]  [phone]    [place]  [phone] (after hours)   Therapeutic Alternative/Mobile Crisis   [phone]   [place] National Suicide Hotline  574-724-9009 (TALK) OR 988   Call 911 or go to emergency room   [facility]  [phone]);  [place] and  [employer]  [phone]); [place], [place], [place], [place], [place], [place], [place]           Follow up:  Patient agrees to Care Plan and Follow-up.  Plan: The Managed Medicaid care management team will  reach out to the patient again over the next 30 days.  Date/time of next scheduled Social Work care management/care coordination outreach:  11/07/22 at 11 am.  [name], BSW, MSW, [place] LCSW [email or web address] Phone: [phone]

## 2022-10-21 ENCOUNTER — Other Ambulatory Visit: Payer: Self-pay | Admitting: Student

## 2022-10-21 DIAGNOSIS — J441 Chronic obstructive pulmonary disease with (acute) exacerbation: Secondary | ICD-10-CM

## 2022-10-22 ENCOUNTER — Ambulatory Visit (INDEPENDENT_AMBULATORY_CARE_PROVIDER_SITE_OTHER): Payer: Medicaid Other | Admitting: Licensed Clinical Social Worker

## 2022-10-22 DIAGNOSIS — F4310 Post-traumatic stress disorder, unspecified: Secondary | ICD-10-CM | POA: Diagnosis not present

## 2022-10-22 DIAGNOSIS — F411 Generalized anxiety disorder: Secondary | ICD-10-CM

## 2022-10-22 DIAGNOSIS — F331 Major depressive disorder, recurrent, moderate: Secondary | ICD-10-CM

## 2022-10-22 NOTE — Progress Notes (Signed)
THERAPIST PROGRESS NOTE  Virtual Visit via Video Note  I connected with [name] on 10/22/22 at 11:00 AM EDT by a video enabled telemedicine application and verified that I am speaking with the correct person using two identifiers.  Location: Patient: [facility]  Provider: Providers Home    I discussed the limitations of evaluation and management by telemedicine and the availability of in person appointments. The patient expressed understanding and agreed to proceed.      I discussed the assessment and treatment plan with the patient. The patient was provided an opportunity to ask questions and all were answered. The patient agreed with the plan and demonstrated an understanding of the instructions.   The patient was advised to call back or seek an in-person evaluation if the symptoms worsen or if the condition fails to improve as anticipated.  I provided 40 minutes of non-face-to-face time during this encounter.   Dory Horn, LCSW   Participation Level: Active  Behavioral Response: CasualAlertAnxious and Depressed  Type of Therapy: Individual Therapy  Treatment Goals addressed: Pt was alert and oriented x 5. He was pleasant, cooperative, and maintained good eye contact. He presented with euthymic mood/affect. He engaged well and maintained good eye contact.   Pt reports primary stressor is illness and grief/loss. Pt reports that he had a cardiovascular appointment recently and things are stable but not going to get any better. Pt reports he is going through the process of accepting his limitations. Pt states that he has re-opened his disability case, and the outcome of that will determine his next steps. Pt states that he uses coping skills such as chores around the house and e biking as primary coping mechanisms.   Intervention/Plan: LCSW used person centered therapy for empowerment and unconditional positive regard using nonjudgmental stance. LCSW used  psychoanalytic therapy for pt to express, thoughts, feelings, and emotions. Plan for pt to continue to use coping skills listed above to decrease anxiety and depression.   ProgressTowards Goals: Progressing  Interventions: Motivational Interviewing, Strength-based, and Supportive   Suicidal/Homicidal: Nowithout intent/plan  Therapist Response:    Pt was alert and oriented x 5. She was dressed casually and engaged well in therapy session. Pt presented with depressed and anxious mood/affect. She was pleasant, cooperative, and maintained good eye contact.   Pt reports primary stressor as roommate conflict. Pt reports struggling with boundary setting with her two roommates. [name] states that she feels more like a mother type figure then a roommate and she does not know how to make it stop.   Intervention: LCSW used psychoeducation to educate pt on boundary setting. LCSW reported to pt to have clear and concise communication with the roommates on what kind of communication they can have. LCSW spoke with pt today about utilizing assertive communication. LCSW used reflective listening and open-ended questions for motivational interviewing. Plan for pt is to sit down with roommates to discuss boundaries and rules moving forward.    Plan: Return again in 3 weeks.  Diagnosis: PTSD (post-traumatic stress disorder)  GAD (generalized anxiety disorder)  Major depressive disorder, recurrent episode, moderate ([place])  Collaboration of Care: none today   Patient/Guardian was advised Release of Information must be obtained prior to any record release in order to collaborate their care with an outside provider. Patient/Guardian was advised if they have not already done so to contact the registration department to sign all necessary forms in order for [place] to release information regarding their care.  Consent: Patient/Guardian gives verbal consent for treatment and assignment of benefits for services  provided during this visit. Patient/Guardian expressed understanding and agreed to proceed.   Dory Horn, LCSW 10/22/2022

## 2022-10-29 DIAGNOSIS — Z419 Encounter for procedure for purposes other than remedying health state, unspecified: Secondary | ICD-10-CM | POA: Diagnosis not present

## 2022-11-05 ENCOUNTER — Other Ambulatory Visit: Payer: Self-pay | Admitting: Obstetrics and Gynecology

## 2022-11-05 ENCOUNTER — Encounter: Payer: Self-pay | Admitting: Obstetrics and Gynecology

## 2022-11-05 ENCOUNTER — Other Ambulatory Visit: Payer: Medicaid Other | Admitting: Obstetrics and Gynecology

## 2022-11-05 NOTE — Patient Outreach (Signed)
Medicaid Managed Care   Nurse Care Manager Note  11/05/2022 Name:  [name] MRN:  [id number] DOB:  04-03-1961  [name] is an 61 y.o. year old female who is a primary patient of [name], MD.  The [facility] Managed Care Coordination team was consulted for assistance with:    Chronic healthcare management needs, HTN, DM, HF, asthma, COPD, OSA, anxiety/depression/PTSD, arthritis, GERD, CAD, HLD , RLS, h/o breast cancer.  [name] was given information about Medicaid Managed Care Coordination team services today. [name] Patient agreed to services and verbal consent obtained.  Engaged with patient by telephone for follow up visit in response to provider referral for case management and/or care coordination services.   Assessments/Interventions:  Review of past medical history, allergies, medications, health status, including review of consultants reports, laboratory and other test data, was performed as part of comprehensive evaluation and provision of chronic care management services.  SDOH (Social Determinants of Health) assessments and interventions performed: SDOH Interventions    Flowsheet Row Patient Outreach Telephone from 11/05/2022 in [place] Patient Outreach Telephone from 10/14/2022 in [place] Coordination Patient Outreach Telephone from 10/06/2022 in [place] Patient Outreach Telephone from 09/05/2022 in [place] Patient Outreach Telephone from 08/01/2022 in [place] Patient Outreach Telephone from 06/12/2022 in [place] Interventions        Food Insecurity Interventions Intervention Not Indicated -- -- -- -- --  Housing Interventions Intervention Not Indicated -- -- -- -- --  Transportation Interventions  -- -- Intervention Not Indicated -- -- --  Utilities Interventions -- -- Intervention Not Indicated -- -- --  Depression Interventions/Treatment  -- -- -- Currently on Treatment, Counseling, Medication -- --  Stress Interventions -- [employer], Provide Counseling -- [employer], Provide Counseling [facility] Well, [place]  No Known Allergies  Medications Reviewed Today     Reviewed by Gayla Medicus, RN (Registered Nurse) on 11/05/22 at 61  Med List Status: <None>   Medication Order Taking? Sig Documenting Provider Last Dose Status Informant  Accu-Chek FastClix Lancets MISC [id number]  Check blood sugar two times a day Lorella Nimrod, MD  Active Self  albuterol (PROVENTIL) (2.5 MG/3ML) 0.083% nebulizer solution [id number]  USE 1 VIAL IN NEBULIZER EVERY 4 HOURS AS NEEDED FOR WHEEZING OR SHORTNESS OF BREATH Lacinda Axon, MD  Active   amlodipine-atorvastatin (CADUET) 5-80 MG tablet [id number]  Take 1 tablet by mouth daily. [name], MD  Active Self  Ascorbic Acid (VITAMIN C) 1000 MG tablet [id number]  Take 1,000 mg by mouth daily. [provider]  Active Self  ASPIRIN ADULT LOW STRENGTH 81 MG EC tablet [id number]  TAKE 1 Tablet BY MOUTH ONCE DAILY Lorella Nimrod, MD  Active Self  B-D UF III MINI PEN NEEDLES 31G X 5 MM MISC [id number]  USE AS DIRECTED DAILY Angelica Pou, MD  Active   Blood Glucose Monitoring Suppl ([place]) w/Device KIT [id number]  1 each by Does not apply route 2 (two) times daily. Lorella Nimrod, MD  Active Self  busPIRone (BUSPAR) 10 MG tablet [id number]  Take 1 tablet (10 mg total) by mouth 3 (three) times daily. Salley Slaughter, NP  Active   citalopram (CELEXA) 20 MG  tablet [id number]  TAKE 1 TABLET BY MOUTH ONCE DAILY. DO NOT TAKE WHILE TAKING DULOXETINE Eulis Canner E, NP  Active    clopidogrel (PLAVIX) 75 MG tablet [id number]  TAKE 1 TABLET BY MOUTH IN THE [name], MD  Active Self  Continuous Blood Gluc Sensor (FREESTYLE LIBRE 3 SENSOR) [place] [id number]  Place 1 sensor on the skin every 14 days. Use to check glucose continuously [name], MD  Active   Dulaglutide (TRULICITY) 3 YQ/6.5HQ SOPN [id number]  Inject 3 mg as directed once a week. [name], MD  Active   Evolocumab [facility] SURECLICK) 413 MG/ML [name] [id number]  INJECT 1 PEN INTO THE SKIN EVERY 14 DAYS Burnell Blanks, MD  Active   exemestane (AROMASIN) 25 MG tablet [id number]  Take 1 tablet (25 mg total) by mouth daily after breakfast. [name], MD  Active Self  ezetimibe (ZETIA) 10 MG tablet [id number]  Take 1 tablet (10 mg total) by mouth daily. [name], PA-C  Expired 05/26/22 2359   ferrous sulfate 325 (65 FE) MG tablet [id number] No Take 1 tablet (325 mg total) by mouth every other day.  Patient not taking: Reported on 11/05/2022   [name], MD Not Taking Active   fexofenadine (ALLEGRA) 180 MG tablet [id number]  Take 1 tablet (180 mg total) by mouth daily. [name], PA-C  Active   fluticasone (FLONASE) 50 MCG/ACT nasal spray [id number]  Place 1 spray into both nostrils daily. Begin by using 2 sprays in each nare daily for 3 to 5 days, then decrease to 1 spray in each nare daily. [name], PA-C  Active     Discontinued 05/09/20 1429   Fluticasone-Umeclidin-Vilant (TRELEGY ELLIPTA) 100-62.5-25 MCG/ACT AEPB [id number]  INHALE 1 PUFF ONCE DAILY Strength: 100-62.5-25 MCG/ACT Lacinda Axon, MD  Active Self  furosemide (LASIX) 40 MG tablet [id number]  Take 1 tablet (40 mg total) by mouth as needed for fluid or edema. Dorethea Clan, DO  Active   gabapentin (NEURONTIN) 300 MG capsule [id number]  Take 2 capsules (600 mg total) by mouth in the morning AND 3 capsules (900 mg total) at bedtime. [name], MD  Active   glucose blood  (ACCU-CHEK GUIDE) test strip [id number]  Check blood sugar 2 times per day [name], MD  Active Self  insulin glargine-yfgn (SEMGLEE) 100 UNIT/ML Pen [id number]  Inject 30 Units into the skin at bedtime. [name], MD  Active   losartan-hydrochlorothiazide [facility]) 100-25 MG tablet [id number]  Take 1 tablet by mouth daily. [name], MD  Active Self  metFORMIN (GLUCOPHAGE-XR) 500 MG 24 hr tablet [id number]  Take 1 tablet (500 mg total) by mouth 2 (two) times daily. [name], MD  Active   methocarbamol (ROBAXIN) 500 MG tablet [id number]  Take 1 tablet (500 mg total) by mouth every 6 (six) hours as needed for muscle spasms. [name], MD  Active   metoprolol succinate (TOPROL XL) 50 MG 24 hr tablet [id number]  Take 1 tablet (50 mg total) by mouth daily. Take with or immediately following a meal. [name], MD  Active Self  montelukast (SINGULAIR) 10 MG tablet [id number]  Take 1 tablet (10 mg total) by mouth at bedtime. [name], MD  Active   Potassium 99 MG TABS [id number]  Take 99 mg by mouth daily. [provider]  Active Self  senna-docusate (SENNA-TIME S) 8.6-50 MG tablet [id number]  Take 1 tablet by mouth at bedtime as needed for mild constipation.  [name], MD  Active   traMADol Veatrice Bourbon) 50 MG tablet [id number]  Take 1 tablet (50 mg total) by mouth every 6 (six) hours as needed. Hazel Sams, PA-C  Active   traZODone (DESYREL) 50 MG tablet [id number]  Take 0.5-1 tablets (25-50 mg total) by mouth at bedtime. Salley Slaughter, NP  Active   VENTOLIN HFA 108 (90 Base) MCG/ACT inhaler [id number]  INHALE 2 PUFFS BY MOUTH EVERY 6 HOURS AS NEEDED FOR WHEEZING FOR SHORTNESS OF Loyal Gambler, MD  Active            Patient Active Problem List   Diagnosis Date Noted   Diabetic neuropathy ([place]) 08/29/2022   Vaginal lesion 08/29/2022   PTSD (post-traumatic stress disorder) 06/24/2022   GAD (generalized anxiety disorder)  06/24/2022   Major depressive disorder, recurrent episode, moderate ([place]) 06/24/2022   Urinary incontinence 06/20/2022   Intertrigo 06/20/2022   Arthritis of left shoulder region    S/P reverse total shoulder arthroplasty, 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   Healthcare maintenance 11/17/2021   Spondylolisthesis, lumbar region 02/11/2021    Class: Acute   Malignant neoplasm of upper-outer quadrant of right breast in female, estrogen receptor positive ([place]) 06/10/2018   Restless leg syndrome 02/04/2018   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   Depression 08/06/2015   Hypertension associated with diabetes ([place])    Aortic stenosis    Chronic diastolic congestive heart failure (HCC)    COPD (chronic obstructive pulmonary disease) ([place]) 09/21/2013   Obstructive sleep apnea 09/21/2013   Nocturnal hypoxemia 09/12/2013   Hyperlipidemia 11/30/2012   History of pulmonary embolus (PE) 07/09/2012   Normocytic anemia 02/08/2012   Type 2 diabetes mellitus without complication, without long-term current use of insulin ([place]) 02/08/2012   Asthma 02/08/2012   GERD (gastroesophageal reflux disease)    CAD (coronary artery disease) 12/08/2011   Conditions to be addressed/monitored per PCP order:  Chronic healthcare management needs, HTN, DM, HF, asthma, COPD, OSA, anxiety/depression/PTSD, arthritis, GERD, CAD, HLD  Care Plan : RN Care Manager Plan of Care  Updates made by Gayla Medicus, RN since 11/05/2022 12:00 AM     Problem: Health Promotion or Disease Self-Management (General Plan of Care)   Priority: High  Onset Date: 04/14/2022     Long-Range Goal: Chronic Disease Management and Care Coordination Needs   Start Date: 04/14/2022  Expected End Date: 02/05/2023  Recent Progress: Not on track  Priority: High  Note:   Current Barriers:  Knowledge Deficits related to plan of care for management of Anxiety and Depression along with other chronic health conditions, HTN, DM Chronic Disease Management  support and education needs related to Anxiety and Depression. DM, HTN 11/05/22:  patient with low BP today-90s/40s-asymptomatic per patient-patient to contact her provider today.  Also, unable to tolerate prescribed iron due to constipation and having to use Trelegy twice a day instead on once a day as ordered-to follow up with provider today.  Blood sugars 152-220.  Continues to be followed by Psychiatry and LCSW.  GYN visit WNL-no vaginal lesion present.  Patient does not have DME ordered by PCP yet-motorized wheelchair and lift chair-unable to afford-RNCM placed call to [place], [place] Medicaid Liaison and left message.Marland Kitchen  RNCM Clinical Goal(s):  Patient will verbalize understanding of plan for management of Anxiety and Depression, DM, HTN  as evidenced by patient report take all medications exactly as prescribed and will call provider for medication related questions as evidenced by patient report demonstrate understanding of rationale  for each prescribed medication as evidenced by patient report attend all scheduled medical appointments  as evidenced by patient report continue to work with RN Care Manager to address care management and care coordination needs related to  Anxiety and Depression, DM, HTN as evidenced by adherence to CM Team Scheduled appointments work with Education officer, museum to address  related to the management of Mental Health Concerns  related to the management of Anxiety and Depression as evidenced by review of EMR and patient or Education officer, museum report through collaboration with Consulting civil engineer, provider, and care team.   Interventions: Inter-disciplinary care team collaboration (see longitudinal plan of care) Evaluation of current treatment plan related to  self management and patient's adherence to plan as established by provider   (Status:  New goal.)  Long Term Goal Evaluation of current treatment plan related to Anxiety and Depression, Mental Health Concerns   self-management and patient's adherence to plan as established by provider. Discussed plans with patient for ongoing care management follow up and provided patient with direct contact information for care management team Advised patient to contact provider for any additional DME needs, CBG, and possible general surgeon referral. Reviewed medications with patient Collaborated with LCSW regarding anxiety/depression Reviewed scheduled/upcoming provider appointments Advised patient, providing education and rationale, to check cbg and record, calling provider  for findings outside established parameters  Social Work referral for anxiety/depression Discussed plans with patient for ongoing care management follow up and provided patient with direct contact information for care management team Assessed social determinant of health barriers Collaborated with [place] Medicaid Liaison regarding DME-motorized wheelchair and lift chair.  Diabetes Interventions:  (Status:  New goal.) Long Term Goal Assessed patient's understanding of A1c goal: <7% Reviewed medications with patient and discussed importance of medication adherence Reviewed scheduled/upcoming provider appointments  Advised patient, providing education and rationale, to check cbg and record, calling provider for findings outside established parameters Review of patient status, including review of consultants reports, relevant laboratory and other test results, and medications completed Assessed social determinant of health barriers Lab Results  Component Value Date   HGBA1C 7.1 (A) 08/27/2022    Hypertension Interventions:  (Status:  New goal.) Long Term Goal Last practice recorded BP readings:  BP Readings from Last 3 Encounters:  09/12/22 133/69  08/27/22 (!) 109/58  07/01/22 137/75   Most recent eGFR/CrCl:  Lab Results  Component Value Date   EGFR 90 06/04/2022    No components found for: "CRCL"  Evaluation  of current treatment plan related to hypertension self management and patient's adherence to plan as established by provider Reviewed medications with patient and discussed importance of compliance Discussed plans with patient for ongoing care management follow up and provided patient with direct contact information for care management team Advised patient, providing education and rationale, to monitor blood pressure daily and record, calling PCP for findings outside established parameters Reviewed scheduled/upcoming provider appointments including:  Assessed social determinant of health barriers   Patient Goals/Self-Care Activities: Take all medications as prescribed Attend all scheduled provider appointments Call pharmacy for medication refills 3-7 days in advance of running out of medications Perform all self care activities independently  Perform IADL's (shopping, preparing meals, housekeeping, managing finances) independently Call provider office for new concerns or questions  Work with the social worker to address care coordination needs and will continue to work with the clinical team to address health care and disease management related needs Patient to contact provider regarding BP, iron, and Trelegy.  Follow Up Plan:  The patient has been provided with contact information for the care management team and has been advised to call with any health related questions or concerns.  The care management team will reach out to the patient again over the next 30 business  days.    Long-Range Goal: Establish Plan of Care for Chronic Disease Management Needs   Start Date: 04/14/2022  Expected End Date: 07/14/2022  Priority: High  Note:   Timeframe:  Long-Range Goal Priority:  High Start Date:      04/14/22                       Expected End Date:     ongoing                  Follow Up Date 12/08/22   - schedule appointment for flu shot - schedule appointment for vaccines needed due to my  age or health - schedule recommended health tests (blood work, mammogram, colonoscopy, pap test) - schedule and keep appointment for annual check-up    Why is this important?   Screening tests can find diseases early when they are easier to treat.  Your doctor or nurse will talk with you about which tests are important for you.  Getting shots for common diseases like the flu and shingles will help prevent them.  11/05/22:  patient has appt with Psychiatry 11/06/22 and LCSW 11/07/22   Follow Up:  Patient agrees to Care Plan and Follow-up.  Plan: The Managed Medicaid care management team will reach out to the patient again over the next 30 business  days. and The  Patient has been provided with contact information for the Managed Medicaid care management team and has been advised to call with any health related questions or concerns.  Date/time of next scheduled RN care management/care coordination outreach:  12/08/22 at 1230.

## 2022-11-05 NOTE — Patient Instructions (Signed)
Hi [name], thank you for speaking to me today.  Please remember to call your provider's office and I will call you back when I hear from [facility].  Thank you!!  [name] was given information about Medicaid Managed Care team care coordination services as a part of their [facility] Medicaid benefit. [name] verbally consented to engagement with the [facility] Managed Care team.   If you are experiencing a medical emergency, please call 911 or report to your local emergency department or urgent care.   If you have a non-emergency medical problem during routine business hours, please contact your provider's office and ask to speak with a nurse.   For questions related to your [facility] health plan, please call: [phone] or go here:[email or web address]  If you would like to schedule transportation through your [facility] plan, please call the following number at least 2 days in advance of your appointment: [phone].  You can also use the MTM portal or MTM mobile app to manage your rides. For the portal, please go to [email or web address].  Call the [place] at [phone], at any time, 24 hours a day, 7 days a week. If you are in danger or need immediate medical attention call 911.  If you would like help to quit smoking, call 1-800-QUIT-NOW 534-405-8852) OR Espaol: 1-855-Djelo-Ya (1-950-932-6712) o para ms informacin haga clic aqu or Text READY to 200-400 to register via text  [name] - following are the goals we discussed in your visit today:   Goals Addressed    Timeframe:  Long-Range Goal Priority:  High Start Date:      04/14/22                       Expected End Date:     ongoing                  Follow Up Date 12/08/22   - schedule appointment for flu shot - schedule appointment for vaccines needed due to my age or health - schedule recommended health tests (blood work, mammogram, colonoscopy, pap test) -  schedule and keep appointment for annual check-up    Why is this important?   Screening tests can find diseases early when they are easier to treat.  Your doctor or nurse will talk with you about which tests are important for you.  Getting shots for common diseases like the flu and shingles will help prevent them.  11/05/22:  patient has appt with Psychiatry 11/06/22 and LCSW 11/07/22  Patient verbalizes understanding of instructions and care plan provided today and agrees to view in [place]. Active MyChart status and patient understanding of how to access instructions and care plan via MyChart confirmed with patient.     The Managed Medicaid care management team will reach out to the patient again over the next 30 business  days.  The  Patient  has been provided with contact information for the Managed Medicaid care management team and has been advised to call with any health related questions or concerns.   Aida Raider RN, BSN [place] Management Coordinator - Managed Medicaid High Risk [phone]   Following is a copy of your plan of care:  Care Plan : [place] of Care  Updates made by Gayla Medicus, RN since 11/05/2022 12:00 AM     Problem: Health Promotion or Disease Self-Management (General Plan of Care)   Priority: High  Onset Date: 04/14/2022     Long-Range Goal: Chronic Disease Management and Care Coordination Needs   Start Date: 04/14/2022  Expected End Date: 02/05/2023  Recent Progress: Not on track  Priority: High  Note:   Current Barriers:  Knowledge Deficits related to plan of care for management of Anxiety and Depression along with other chronic health conditions, HTN, DM Chronic Disease Management support and education needs related to Anxiety and Depression. DM, HTN 11/05/22:  patient with low BP today-90s/40s-asymptomatic per patient-patient to contact her provider today.  Also, unable to tolerate prescribed iron due to  constipation and having to use Trelegy twice a day instead on once a day as ordered-to follow up with provider today.  Blood sugars 152-220.  Continues to be followed by Psychiatry and LCSW.  GYN visit WNL-no vaginal lesion present.  Patient does not have DME ordered by PCP yet-motorized wheelchair and lift chair-unable to afford-RNCM placed call to [place], [place] Medicaid Liaison and left message.Marland Kitchen  RNCM Clinical Goal(s):  Patient will verbalize understanding of plan for management of Anxiety and Depression, DM, HTN  as evidenced by patient report take all medications exactly as prescribed and will call provider for medication related questions as evidenced by patient report demonstrate understanding of rationale for each prescribed medication as evidenced by patient report attend all scheduled medical appointments  as evidenced by patient report continue to work with RN Care Manager to address care management and care coordination needs related to  Anxiety and Depression, DM, HTN as evidenced by adherence to CM Team Scheduled appointments work with Education officer, museum to address  related to the management of Mental Health Concerns  related to the management of Anxiety and Depression as evidenced by review of EMR and patient or Education officer, museum report through collaboration with Consulting civil engineer, provider, and care team.   Interventions: Inter-disciplinary care team collaboration (see longitudinal plan of care) Evaluation of current treatment plan related to  self management and patient's adherence to plan as established by provider   (Status:  New goal.)  Long Term Goal Evaluation of current treatment plan related to Anxiety and Depression, Mental Health Concerns  self-management and patient's adherence to plan as established by provider. Discussed plans with patient for ongoing care management follow up and provided patient with direct contact information for care management team Advised  patient to contact provider for any additional DME needs, CBG, and possible general surgeon referral. Reviewed medications with patient Collaborated with LCSW regarding anxiety/depression Reviewed scheduled/upcoming provider appointments Advised patient, providing education and rationale, to check cbg and record, calling provider  for findings outside established parameters  Social Work referral for anxiety/depression Discussed plans with patient for ongoing care management follow up and provided patient with direct contact information for care management team Assessed social determinant of health barriers Collaborated with [place] Medicaid Liaison regarding DME-motorized wheelchair and lift chair.  Diabetes Interventions:  (Status:  New goal.) Long Term Goal Assessed patient's understanding of A1c goal: <7% Reviewed medications with patient and discussed importance of medication adherence Reviewed scheduled/upcoming provider appointments  Advised patient, providing education and rationale, to check cbg and record, calling provider for findings outside established parameters Review of patient status, including review of consultants reports, relevant laboratory and other test results, and medications completed Assessed social determinant of health barriers Lab Results  Component Value Date   HGBA1C 7.1 (A) 08/27/2022    Hypertension Interventions:  (Status:  New goal.) Long Term Goal Last practice recorded  BP readings:  BP Readings from Last 3 Encounters:  09/12/22 133/69  08/27/22 (!) 109/58  07/01/22 137/75   Most recent eGFR/CrCl:  Lab Results  Component Value Date   EGFR 90 06/04/2022    No components found for: "CRCL"  Evaluation of current treatment plan related to hypertension self management and patient's adherence to plan as established by provider Reviewed medications with patient and discussed importance of compliance Discussed plans with patient  for ongoing care management follow up and provided patient with direct contact information for care management team Advised patient, providing education and rationale, to monitor blood pressure daily and record, calling PCP for findings outside established parameters Reviewed scheduled/upcoming provider appointments including:  Assessed social determinant of health barriers   Patient Goals/Self-Care Activities: Take all medications as prescribed Attend all scheduled provider appointments Call pharmacy for medication refills 3-7 days in advance of running out of medications Perform all self care activities independently  Perform IADL's (shopping, preparing meals, housekeeping, managing finances) independently Call provider office for new concerns or questions  Work with the social worker to address care coordination needs and will continue to work with the clinical team to address health care and disease management related needs Patient to contact provider regarding BP, iron, and Trelegy.  Follow Up Plan:  The patient has been provided with contact information for the care management team and has been advised to call with any health related questions or concerns.  The care management team will reach out to the patient again over the next 30 business  days.

## 2022-11-05 NOTE — Patient Outreach (Signed)
Care Coordination  11/05/2022  [name] [birth year] [id number]  RNCM collaborated with Francia Greaves, [facility] Managed Medicaid Liaison,  regarding DME-lift chair and motorized wheelchair.  Per Randi, lift chair probably not covered, but motorized wheel chair should be.  RNCM will follow up with patient again.  Aida Raider RN, BSN [place]  [facility] Curator - Managed Medicaid High Risk [phone].

## 2022-11-06 ENCOUNTER — Ambulatory Visit (INDEPENDENT_AMBULATORY_CARE_PROVIDER_SITE_OTHER): Payer: Medicaid Other | Admitting: Licensed Clinical Social Worker

## 2022-11-06 ENCOUNTER — Other Ambulatory Visit: Payer: Self-pay | Admitting: Student

## 2022-11-06 ENCOUNTER — Encounter (HOSPITAL_COMMUNITY): Payer: Self-pay | Admitting: Psychiatry

## 2022-11-06 ENCOUNTER — Telehealth (INDEPENDENT_AMBULATORY_CARE_PROVIDER_SITE_OTHER): Payer: Medicaid Other | Admitting: Psychiatry

## 2022-11-06 DIAGNOSIS — F411 Generalized anxiety disorder: Secondary | ICD-10-CM | POA: Diagnosis not present

## 2022-11-06 DIAGNOSIS — F4310 Post-traumatic stress disorder, unspecified: Secondary | ICD-10-CM | POA: Diagnosis not present

## 2022-11-06 DIAGNOSIS — E1142 Type 2 diabetes mellitus with diabetic polyneuropathy: Secondary | ICD-10-CM

## 2022-11-06 DIAGNOSIS — F32A Depression, unspecified: Secondary | ICD-10-CM | POA: Diagnosis not present

## 2022-11-06 DIAGNOSIS — F331 Major depressive disorder, recurrent, moderate: Secondary | ICD-10-CM

## 2022-11-06 MED ORDER — CITALOPRAM HYDROBROMIDE 20 MG PO TABS: ORAL_TABLET | ORAL | 3 refills | Status: DC

## 2022-11-06 MED ORDER — BUSPIRONE HCL 10 MG PO TABS: 10.0000 mg | ORAL_TABLET | Freq: Three times a day (TID) | ORAL | 3 refills | Status: DC

## 2022-11-06 MED ORDER — TRAZODONE HCL 50 MG PO TABS: 50.0000 mg | ORAL_TABLET | Freq: Every day | ORAL | 3 refills | Status: DC
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
## 2022-11-06 NOTE — Progress Notes (Signed)
BH MD/PA/NP OP Progress Note Virtual Visit via Video Note  I connected with [name] on 11/06/22 at 11:30 AM EST by a video enabled telemedicine application and verified that I am speaking with the correct person using two identifiers.  Location: Patient: Home Provider: Clinic   I discussed the limitations of evaluation and management by telemedicine and the availability of in person appointments. The patient expressed understanding and agreed to proceed.  I provided 30 minutes of non-face-to-face time during this encounter.   11/06/2022 11:49 AM [name]  MRN:  [id number]  Chief Complaint: "I wake up around 3 am"  HPI:  61 year old female seen today for follow-up psychiatric evaluation.  She was referred to outpatient psychiatry by her counselor for medication management.  She has a psychiatric history of anxiety, depression, and PTSD.  Currently she is being managed on BuSpar 10 mg 3 times daily (noting that she takes it twice daily), trazodone 25 to 50 mg nightly as needed, and Celexa 20 mg daily.  She reports the medications are somewhat effective in managing her psychiatric condition.   Today she was well-groomed, pleasant, cooperative, and engaged in conversation.  She informed Probation officer that her sleep has improved however notes that she continues to wake up around 3 AM and is unable to go back to sleep.  She notes that she wakes up to urinate but cannot drift back off to sleep.  Patient notes that she is sleeping 4 to 5 hours.  Provider recommended taking the extra dose of trazodone after waking up.  She endorsed understanding and agreed.    Since her last visit she notes that her anxiety and depression has worsened.  She reports being concerned about her husband who suffers from COPD.  She notes that she fears that he will stop breathing at night.  She informed Probation officer that in the past she had a roommate who died in her home.  She feels that this may worsen how she  feels about her husbands condition.  Patient also informed Probation officer that she worries about her roommates who looks to her to do everything.  She notes that she is trying to set boundaries as she is tired of their behaviors.  Provider conducted a GAD-7 and patient scored a 14, at her last visit she scored a 5.  Provider also conducted PHQ-9 and she scored a 9, at her last visit she scored a 6.  She endorses adequate appetite.  Patient notes that her weight fluctuates 10 pounds.  Today she denies SI/HI/VAH, mania, paranoia.    Patient informed Probation officer that she is in constant pain.  She reports that her back, legs, and neck hurt.  She does received medications for pain management.    Today patient is agreeable to increasing trazodone 25 to 50 mg to 50 to 100 mg daily to help better sleep, anxiety, depression.  Provider discussed the risk of being on 2 antidepressants and notes that it can cause serotonin syndrome.  She endorsed understanding and agreed.  She will continue all other medications as prescribed and follow-up with outpatient counseling for therapy.  No other concerns noted at this time.     Visit Diagnosis:    ICD-10-CM   1. GAD (generalized anxiety disorder)  F41.1 busPIRone (BUSPAR) 10 MG tablet    citalopram (CELEXA) 20 MG tablet    traZODone (DESYREL) 50 MG tablet    2. PTSD (post-traumatic stress disorder)  F43.10 busPIRone (BUSPAR) 10 MG tablet  citalopram (CELEXA) 20 MG tablet    traZODone (DESYREL) 50 MG tablet    3. Mild depression  F32.A busPIRone (BUSPAR) 10 MG tablet    citalopram (CELEXA) 20 MG tablet    traZODone (DESYREL) 50 MG tablet      Past Psychiatric History: anxiety, depression, and PTSD  Past Medical History:  Past Medical History:  Diagnosis Date   Anemia    Anxiety    Aortic valve stenosis, severe    Arthritis    PAIN AND SEVERE OA LEFT KNEE ; S/P RIGHT TKA ON 02/03/12; HAS LOWER BACK PAIN-UNABLE TO STAND MORE THAN 10 MIN; ARTHRITIS "ALL OVER"   Asthma     Blood transfusion    [facility]   Breast cancer in female [facility])    Right   CAD (coronary artery disease)    Cath 2010 with DES x 1 RCA-- PT'S CARDIOLOGIST IS DR. MCALHANY   Chronic diastolic congestive heart failure (HCC)    COPD (chronic obstructive pulmonary disease) (HCC)    Depression    Diabetes mellitus DIAGNOSED IN2010   Dyspnea    with much ambulation   Eczema    on back   Headache    migraines younger- rare now 02/07/21   Heart murmur    no current problems   History of hiatal hernia    History of kidney stones    passed or blasted   Hyperlipidemia    Hypertension    Morbid obesity with body mass index of 60.0-69.9 in adult [facility])    Myocardial infarction ([place])    PT THINKS SHE WAS DX WITH MI AT THE TIME OF HEART STENTING   Neuromuscular disorder (HCC)    bilateral arm/hands   Oxygen dependent    uses 3L oxygen night/prn   Personal history of radiation therapy    Pneumonia    Pulmonary embolism ([place]) 02/08/2012   S/P RT TOTAL KNEE ON 02/03/12--ON 02/08/12--DEVELOPED ACUTE SOB AND CHEST PAIN--AND DIAGNOSED WITH  PULMONARY EMBOLUS AND PNEUMONIA   Restless leg syndrome    Sleep apnea    uses 3 liters O2 at night as needed   Uterine fibroid    NO PROBLEMS AT PRESENT FROM THE FIBROIDS-STATES SHE IS POST MENOPAUSAL-LAST MENSES 2010 EXCEPT FOR EPISODE THIS YR OF BLEEDING RELATED TO FIBROIDS.   Weakness    BOTH HANDS - S/P BILATERAL CARPAL TUNNEL RELEASE--BUT STILL HAS WEAKNESS--OFTEN DROPS THINGS    Past Surgical History:  Procedure Laterality Date   BACK SURGERY  02/11/2021   Dr Louanne Skye   BREAST BIOPSY Right 06/04/2018   BREAST LUMPECTOMY Right 06/2018   BREAST LUMPECTOMY WITH RADIOACTIVE SEED AND SENTINEL LYMPH NODE BIOPSY Right 07/19/2018   Procedure: BREAST LUMPECTOMY WITH RADIOACTIVE SEED AND SENTINEL LYMPH NODE BIOPSY;  Surgeon: Alphonsa Overall, MD;  Location: [place];  Service: General;  Laterality: Right;   CARDIAC CATHETERIZATION     CARDIAC VALVE REPLACEMENT      2017   CARPAL TUNNEL RELEASE     Bilateral   CHOLECYSTECTOMY     COLONOSCOPY     CORONARY ANGIOPLASTY     2010 has stent in place   CYSTOSCOPY W/ RETROGRADES Right 09/21/2013   Procedure: CYSTOSCOPY WITH RIGHT RETROGRADE PYELOGRAM RIGHT DOUBLE J STENT ;  Surgeon: Fredricka Bonine, MD;  [facility];  Service: Urology;  Laterality: Right;   CYSTOSCOPY WITH URETEROSCOPY AND STENT PLACEMENT Right 10/25/2013   Procedure: CYSTOSCOPY RIGHT URETEROSCOPY HOLMIUM LASER LITHO AND STENT PLACEMENT;  Surgeon: Rodman Key  Marella Bile, MD;  [facility];  Service: Urology;  Laterality: Right;   HERNIA REPAIR     INTRAOPERATIVE TRANSESOPHAGEAL ECHOCARDIOGRAM N/A 12/12/2014   Procedure: INTRAOPERATIVE TRANSESOPHAGEAL ECHOCARDIOGRAM;  Surgeon: Burnell Blanks, MD;  Location: [place];  Service: Cardiovascular;  Laterality: N/A;   JOINT REPLACEMENT     bil total knees   KNEE ARTHROPLASTY  02/03/2012   Procedure: COMPUTER ASSISTED TOTAL KNEE ARTHROPLASTY;  Surgeon: Mcarthur Rossetti, MD;  Location: [place];  Service: Orthopedics;  Laterality: Right;  Right total knee arthroplasty   LEFT AND RIGHT HEART CATHETERIZATION WITH CORONARY ANGIOGRAM N/A 03/17/2013   Procedure: LEFT AND RIGHT HEART CATHETERIZATION WITH CORONARY ANGIOGRAM;  Surgeon: Burnell Blanks, MD;  Location: [facility] CATH LAB;  Service: Cardiovascular;  Laterality: N/A;   LEFT AND RIGHT HEART CATHETERIZATION WITH CORONARY/GRAFT ANGIOGRAM N/A 09/14/2014   Procedure: LEFT AND RIGHT HEART CATHETERIZATION WITH Beatrix Fetters;  Surgeon: Burnell Blanks, MD;  Location: [facility] CATH LAB;  Service: Cardiovascular;  Laterality: N/A;   REVERSE SHOULDER ARTHROPLASTY Left 03/06/2022   Procedure: LEFT SHOULDER REPLACEMENT APPLICATION OF WOUND VAC;  Surgeon: [name], MD;  Location: [place];  Service: Orthopedics;  Laterality: Left;   TEE WITHOUT CARDIOVERSION N/A 03/14/2013   Procedure: TRANSESOPHAGEAL ECHOCARDIOGRAM  (TEE);  Surgeon: Lelon Perla, MD;  Location: [facility] ENDOSCOPY;  Service: Cardiovascular;  Laterality: N/A;   TEE WITHOUT CARDIOVERSION N/A 11/14/2014   Procedure: TRANSESOPHAGEAL ECHOCARDIOGRAM (TEE);  Surgeon: Lelon Perla, MD;  Location: [facility] ENDOSCOPY;  Service: Cardiovascular;  Laterality: N/A;   TONSILLECTOMY     maybe as a child- does not know   TOTAL KNEE ARTHROPLASTY  09/10/2012   Procedure: TOTAL KNEE ARTHROPLASTY;  Surgeon: Mcarthur Rossetti, MD;  [facility];  Service: Orthopedics;  Laterality: Left;   TOTAL KNEE REVISION Right 07/15/2013   Procedure: REVISION ARTHROPLASTY RIGHT KNEE;  Surgeon: Mcarthur Rossetti, MD;  [facility];  Service: Orthopedics;  Laterality: Right;   TRANSCATHETER AORTIC VALVE REPLACEMENT, TRANSFEMORAL N/A 12/12/2014   Procedure: TRANSCATHETER AORTIC VALVE REPLACEMENT, TRANSFEMORAL;  Surgeon: Burnell Blanks, MD;  Location: [place];  Service: Cardiovascular;  Laterality: N/A;   TRIGGER FINGER RELEASE  09/10/2012   Procedure: RELEASE TRIGGER FINGER/A-1 PULLEY;  Surgeon: Mcarthur Rossetti, MD;  [facility];  Service: Orthopedics;  Laterality: Right;  Right Ring Finger   TUBAL LIGATION      Family Psychiatric History: Mother alcohol use and brother intellectual delay   Family History:  Family History  Problem Relation Age of Onset   Breast cancer Mother        stage IV at diagnosis   Emphysema Mother        smoked   Heart disease Mother    COPD Father        smoked   Asthma Father    Heart disease Father    Cancer Brother        Sinus    Social History:  Social History   Socioeconomic History   Marital status: Married    Spouse name: Not on file   Number of children: 2   Years of education: Not on file   Highest education level: Not on file  Occupational History   Occupation: Disabled  Tobacco Use   Smoking status: Former    Packs/day: 1.50    Years: 30.00    Total pack years: 45.00    Types:  Cigarettes    Quit date: 12/29/2000  Years since quitting: 21.8   Smokeless tobacco: Never  Vaping Use   Vaping Use: Never used  Substance and Sexual Activity   Alcohol use: Not Currently   Drug use: Not Currently    Types: Marijuana    Comment: CBD oils through vape shops   Sexual activity: Not Currently    Birth control/protection: Surgical, Post-menopausal    Comment: tubal ligation  Other Topics Concern   Not on file  Social History Narrative   Not on file   Social Determinants of Health   Financial Resource Strain: High Risk (06/24/2022)   Overall Financial Resource Strain (CARDIA)    Difficulty of Paying Living Expenses: Hard  Food Insecurity: No Food Insecurity (11/05/2022)   Hunger Vital Sign    Worried About Running Out of Food in the Last Year: Never true    Ran Out of Food in the Last Year: Never true  Transportation Needs: No Transportation Needs (10/06/2022)   PRAPARE - Hydrologist (Medical): No    Lack of Transportation (Non-Medical): No  Physical Activity: Inactive (06/24/2022)   Exercise Vital Sign    Days of Exercise per Week: 0 days    Minutes of Exercise per Session: 0 min  Stress: Stress Concern Present (10/14/2022)   [place]    Feeling of Stress : Rather much  Social Connections: Moderately Isolated (06/24/2022)   Social Connection and Isolation Panel [NHANES]    Frequency of Communication with Friends and Family: Once a week    Frequency of Social Gatherings with Friends and Family: More than three times a week    Attends Religious Services: Never    Marine scientist or Organizations: No    Attends Music therapist: Never    Marital Status: Married    Allergies: No Known Allergies  Metabolic Disorder Labs: Lab Results  Component Value Date   HGBA1C 7.1 (A) 08/27/2022   MPG 182.9 02/14/2022   MPG 163 02/12/2021   No results  found for: "PROLACTIN" Lab Results  Component Value Date   CHOL 164 06/04/2022   TRIG 131 06/04/2022   HDL 54 06/04/2022   CHOLHDL 3.0 06/04/2022   VLDL 20 12/28/2015   LDLCALC 87 06/04/2022   LDLCALC 106 (H) 02/19/2022   Lab Results  Component Value Date   TSH 2.820 09/25/2020   TSH 2.918 09/21/2013    Therapeutic Level Labs: No results found for: "LITHIUM" No results found for: "VALPROATE" No results found for: "CBMZ"  Current Medications: Current Outpatient Medications  Medication Sig Dispense Refill   Accu-Chek FastClix Lancets MISC Check blood sugar two times a day 102 each 9   albuterol (PROVENTIL) (2.5 MG/3ML) 0.083% nebulizer solution USE 1 VIAL IN NEBULIZER EVERY 4 HOURS AS NEEDED FOR WHEEZING OR SHORTNESS OF BREATH 150 mL 1   amlodipine-atorvastatin (CADUET) 5-80 MG tablet Take 1 tablet by mouth daily. 90 tablet 3   Ascorbic Acid (VITAMIN C) 1000 MG tablet Take 1,000 mg by mouth daily.     ASPIRIN ADULT LOW STRENGTH 81 MG EC tablet TAKE 1 Tablet BY MOUTH ONCE DAILY 90 tablet 3   B-D UF III MINI PEN NEEDLES 31G X 5 MM MISC USE AS DIRECTED DAILY 100 each 5   Blood Glucose Monitoring Suppl (ACCU-CHEK GUIDE) w/Device KIT 1 each by Does not apply route 2 (two) times daily. 1 kit 1   busPIRone (BUSPAR) 10 MG tablet Take 1 tablet (  10 mg total) by mouth 3 (three) times daily. 90 tablet 3   citalopram (CELEXA) 20 MG tablet TAKE 1 TABLET BY MOUTH ONCE DAILY. DO NOT TAKE WHILE TAKING DULOXETINE 30 tablet 3   clopidogrel (PLAVIX) 75 MG tablet TAKE 1 TABLET BY MOUTH IN THE MORNING 90 tablet 3   Continuous Blood Gluc Sensor (FREESTYLE LIBRE 3 SENSOR) MISC Place 1 sensor on the skin every 14 days. Use to check glucose continuously 2 each 11   Dulaglutide (TRULICITY) 3 CV/8.1MM SOPN Inject 3 mg as directed once a week. 2 mL 1   Evolocumab (REPATHA SURECLICK) 037 MG/ML SOAJ INJECT 1 PEN INTO THE SKIN EVERY 14 DAYS 2 mL 11   exemestane (AROMASIN) 25 MG tablet Take 1 tablet (25 mg total)  by mouth daily after breakfast. 90 tablet 3   ezetimibe (ZETIA) 10 MG tablet Take 1 tablet (10 mg total) by mouth daily. 90 tablet 3   ferrous sulfate 325 (65 FE) MG tablet Take 1 tablet (325 mg total) by mouth every other day. (Patient not taking: Reported on 11/05/2022) 30 tablet 3   fexofenadine (ALLEGRA) 180 MG tablet Take 1 tablet (180 mg total) by mouth daily. 90 tablet 1   fluticasone (FLONASE) 50 MCG/ACT nasal spray Place 1 spray into both nostrils daily. Begin by using 2 sprays in each nare daily for 3 to 5 days, then decrease to 1 spray in each nare daily. 32 mL 1   Fluticasone-Umeclidin-Vilant (TRELEGY ELLIPTA) 100-62.5-25 MCG/ACT AEPB INHALE 1 PUFF ONCE DAILY Strength: 100-62.5-25 MCG/ACT 180 each 5   furosemide (LASIX) 40 MG tablet Take 1 tablet (40 mg total) by mouth as needed for fluid or edema. 180 tablet 1   gabapentin (NEURONTIN) 300 MG capsule Take 2 capsules (600 mg total) by mouth in the morning AND 3 capsules (900 mg total) at bedtime. 150 capsule 1   glucose blood (ACCU-CHEK GUIDE) test strip Check blood sugar 2 times per day 100 each 9   insulin glargine-yfgn (SEMGLEE) 100 UNIT/ML Pen Inject 30 Units into the skin at bedtime. 15 mL 2   losartan-hydrochlorothiazide (HYZAAR) 100-25 MG tablet Take 1 tablet by mouth daily. 90 tablet 3   metFORMIN (GLUCOPHAGE-XR) 500 MG 24 hr tablet Take 1 tablet (500 mg total) by mouth 2 (two) times daily. 90 tablet 2   methocarbamol (ROBAXIN) 500 MG tablet Take 1 tablet (500 mg total) by mouth every 6 (six) hours as needed for muscle spasms. 30 tablet 0   metoprolol succinate (TOPROL XL) 50 MG 24 hr tablet Take 1 tablet (50 mg total) by mouth daily. Take with or immediately following a meal. 90 tablet 3   montelukast (SINGULAIR) 10 MG tablet Take 1 tablet (10 mg total) by mouth at bedtime. 90 tablet 2   Potassium 99 MG TABS Take 99 mg by mouth daily.     senna-docusate (SENNA-TIME S) 8.6-50 MG tablet Take 1 tablet by mouth at bedtime as needed for  mild constipation. 30 tablet 1   traMADol (ULTRAM) 50 MG tablet Take 1 tablet (50 mg total) by mouth every 6 (six) hours as needed. 15 tablet 0   traZODone (DESYREL) 50 MG tablet Take 1-2 tablets (50-100 mg total) by mouth at bedtime. 60 tablet 3   VENTOLIN HFA 108 (90 Base) MCG/ACT inhaler INHALE 2 PUFFS BY MOUTH EVERY 6 HOURS AS NEEDED FOR WHEEZING FOR SHORTNESS OF BREATH 18 g 3   No current facility-administered medications for this visit.     Musculoskeletal: Strength &  Muscle Tone: within normal limits and Telehealth visit Gait & Station: normal, Telehealth Patient leans: N/A  Psychiatric Specialty Exam: Review of Systems  Last menstrual period 02/03/2012.There is no height or weight on file to calculate BMI.  General Appearance: Well Groomed  Eye Contact:  Good  Speech:  Clear and Coherent and Normal Rate  Volume:  Normal  Mood:  Anxious  Affect:  Appropriate and Congruent  Thought Process:  Coherent, Goal Directed, and Linear  Orientation:  Full (Time, Place, and Person)  Thought Content: WDL and Logical   Suicidal Thoughts:  No  Homicidal Thoughts:  No  Memory:  Immediate;   Good Recent;   Good Remote;   Good  Judgement:  Good  Insight:  Good  Psychomotor Activity:  Normal  Concentration:  Concentration: Good and Attention Span: Good  Recall:  Good  Fund of Knowledge: Good  Language: Good  Akathisia:  No  Handed:  Right  AIMS (if indicated): not done  Assets:  Communication Skills Desire for Improvement Financial Resources/Insurance Housing Leisure Time Physical Health Social Support  ADL's:  Intact  Cognition: WNL  Sleep:  Fair   Screenings: GAD-7    Flowsheet Row Video Visit from 11/06/2022 in [facility] Office Visit from 10/08/2022 in [facility] [employer] [facility] Counselor from 09/09/2022 in [facility] Office Visit from 08/12/2022 in [facility] Counselor from 06/24/2022 in [facility]  Total GAD-7 Score _0 PHQ2-9    Flowsheet Row Video Visit from 11/06/2022 in [facility] Office Visit from 10/08/2022 in [place] for [place] [facility] Counselor from 09/09/2022 in [facility] Patient Outreach Telephone from 09/05/2022 in [place] [facility] Office Visit from 08/12/2022 in [place]  PHQ-2 Total Score 4 0 0 3 3  PHQ-9 Total Score _1 [place] Visit from 08/12/2022 in [facility] ED from 07/01/2022 in [facility] [place] from 06/24/2022 in [place] Error: Question 6 not populated No Risk Low Risk        Assessment and Plan: Patient reports that she wakes up at 3 am and is unable to fall back to sleep.  She also notes that she has increased anxiety about her husband's health and her roommates.  Today patient agreeable to increase trazodone 25-50 mg to 50-100 mg to help manage sleep, anxiety, and depression.  Provider discussed the risk of being on 2 antidepressants that could lead to serotonin syndrome.  She will continue all other medications as prescribed.   1. GAD (generalized anxiety disorder)  Continue- busPIRone (BUSPAR) 10 MG tablet; Take 1 tablet (10 mg total) by mouth 3 (three) times daily.  Dispense: 90 tablet; Refill: 3 Continue- citalopram (CELEXA) 20 MG tablet; TAKE 1 TABLET BY MOUTH ONCE DAILY. DO NOT TAKE WHILE TAKING DULOXETINE  Dispense: 30 tablet; Refill: 3 Increased- traZODone (DESYREL) 50 MG tablet; Take 1-2 tablets (50-100 mg total) by mouth at bedtime.  Dispense: 60 tablet; Refill: 3  2. PTSD (post-traumatic stress disorder)  Continue- busPIRone (BUSPAR) 10 MG tablet; Take  1 tablet (10 mg total) by mouth 3 (three) times daily.  Dispense: 90 tablet;  Refill: 3 Continue- citalopram (CELEXA) 20 MG tablet; TAKE 1 TABLET BY MOUTH ONCE DAILY. DO NOT TAKE WHILE TAKING DULOXETINE  Dispense: 30 tablet; Refill: 3 Increased- traZODone (DESYREL) 50 MG tablet; Take 1-2 tablets (50-100 mg total) by mouth at bedtime.  Dispense: 60 tablet; Refill: 3  3. Mild depression  Continue- busPIRone (BUSPAR) 10 MG tablet; Take 1 tablet (10 mg total) by mouth 3 (three) times daily.  Dispense: 90 tablet; Refill: 3 Continue- citalopram (CELEXA) 20 MG tablet; TAKE 1 TABLET BY MOUTH ONCE DAILY. DO NOT TAKE WHILE TAKING DULOXETINE  Dispense: 30 tablet; Refill: 3 Increased- traZODone (DESYREL) 50 MG tablet; Take 1-2 tablets (50-100 mg total) by mouth at bedtime.  Dispense: 60 tablet; Refill: 3   Collaboration of Care: Collaboration of Care: Other provider involved in patient's care AEB PCP and counselor  Patient/Guardian was advised Release of Information must be obtained prior to any record release in order to collaborate their care with an outside provider. Patient/Guardian was advised if they have not already done so to contact the registration department to sign all necessary forms in order for [place] to release information regarding their care.   Consent: Patient/Guardian gives verbal consent for treatment and assignment of benefits for services provided during this visit. Patient/Guardian expressed understanding and agreed to proceed.   Follow-up in 3 months Follow-up therapy Salley Slaughter, NP 11/06/2022, 11:49 AM

## 2022-11-07 ENCOUNTER — Encounter: Payer: Self-pay | Admitting: Obstetrics and Gynecology

## 2022-11-07 ENCOUNTER — Other Ambulatory Visit: Payer: Medicaid Other | Admitting: Obstetrics and Gynecology

## 2022-11-07 ENCOUNTER — Other Ambulatory Visit: Payer: Medicaid Other | Admitting: Licensed Clinical Social Worker

## 2022-11-07 NOTE — Patient Outreach (Signed)
Medicaid Managed Care Social Work Note  11/07/2022 Name:  [name] MRN:  [id number] DOB:  18-May-1961  [name] is an 61 y.o. year old female who is a primary patient of [name], MD.  The [facility] team was consulted for assistance with:  [place] and Resources  Ms. Riege was given information about Medicaid Managed Care Coordination team services today. [name] Patient agreed to services and verbal consent obtained.  Engaged with patient  for by telephone forfollow up visit in response to referral for case management and/or care coordination services.   Assessments/Interventions:  Review of past medical history, allergies, medications, health status, including review of consultants reports, laboratory and other test data, was performed as part of comprehensive evaluation and provision of chronic care management services.  SDOH: (Social Determinant of Health) assessments and interventions performed: SDOH Interventions    Flowsheet Row Patient Outreach Telephone from 11/07/2022 in [place] Patient Outreach Telephone from 11/05/2022 in [place] Patient Outreach Telephone from 10/14/2022 in [place] Coordination Patient Outreach Telephone from 10/06/2022 in [place] Patient Outreach Telephone from 09/05/2022 in [place] Patient Outreach Telephone from 08/01/2022 in [place] Interventions        Food Insecurity Interventions -- Intervention Not Indicated -- -- -- --  Housing Interventions -- Intervention Not Indicated -- -- -- --  Transportation Interventions -- -- -- Intervention Not Indicated -- --  Utilities Interventions -- -- -- Intervention Not Indicated -- --  Depression  Interventions/Treatment  -- -- -- -- Currently on Treatment, Counseling, Medication --  Stress Interventions Provide Counseling, [place], Provide Counseling [facility], [employer] [facility] Resources       Advanced Directives Status:  See Care Plan for related entries.  Care Plan                 No Known Allergies  Medications Reviewed Today     Reviewed by Greg Cutter, LCSW (Social Worker) on 11/07/22 at 1107  Med List Status: <None>   Medication Order Taking? Sig Documenting Provider Last Dose Status Informant  Accu-Chek FastClix Lancets MISC [id number] No Check blood sugar two times a day Lorella Nimrod, MD Taking Active Self  albuterol (PROVENTIL) (2.5 MG/3ML) 0.083% nebulizer solution [id number]  USE 1 VIAL IN NEBULIZER EVERY 4 HOURS AS NEEDED FOR WHEEZING OR SHORTNESS OF BREATH Lacinda Axon, MD  Active   amlodipine-atorvastatin (CADUET) 5-80 MG tablet [id number] No Take 1 tablet by mouth daily. [name], MD Taking Active Self  Ascorbic Acid (VITAMIN C) 1000 MG tablet [id number] No Take 1,000 mg by mouth daily. [provider] Taking Active Self  ASPIRIN ADULT LOW STRENGTH 81 MG EC tablet [id number] No TAKE 1 Tablet BY MOUTH ONCE DAILY Lorella Nimrod, MD Taking Active Self  B-D UF III MINI PEN NEEDLES 31G X 5 MM MISC [id number] No USE AS DIRECTED DAILY Angelica Pou, MD Taking Active   Blood Glucose Monitoring Suppl (ACCU-CHEK GUIDE) w/Device KIT [id number] No 1 each by Does not apply route 2 (two) times daily. Lorella Nimrod, MD Taking Active Self  busPIRone (BUSPAR) 10 MG tablet [id number]  Take 1 tablet (10 mg total) by mouth 3 (three) times daily. Salley Slaughter, NP  Active  citalopram (CELEXA) 20 MG tablet [id number]  TAKE 1 TABLET BY MOUTH ONCE DAILY. DO NOT TAKE WHILE TAKING DULOXETINE Eulis Canner E, NP   Active   clopidogrel (PLAVIX) 75 MG tablet [id number] No TAKE 1 TABLET BY MOUTH IN THE [name], MD Taking Active Self  Continuous Blood Gluc Sensor (FREESTYLE LIBRE 3 SENSOR) [place] [id number] No Place 1 sensor on the skin every 14 days. Use to check glucose continuously [name], MD Taking Active   Dulaglutide (TRULICITY) 3 PX/1.0GY SOPN [id number] No Inject 3 mg as directed once a week. [name], MD Taking Active   Evolocumab [facility] SURECLICK) 035 MG/ML [name] [id number] No INJECT 1 PEN INTO THE SKIN EVERY 14 DAYS Burnell Blanks, MD Taking Active   exemestane (AROMASIN) 25 MG tablet [id number] No Take 1 tablet (25 mg total) by mouth daily after breakfast. [name], MD Taking Active Self  ezetimibe (ZETIA) 10 MG tablet [id number] No Take 1 tablet (10 mg total) by mouth daily. [name], PA-C 03/06/2022 0730 Expired 05/26/22 2359   ferrous sulfate 325 (65 FE) MG tablet [id number] No Take 1 tablet (325 mg total) by mouth every other day.  Patient not taking: Reported on 11/05/2022   [name], MD Not Taking Active   fexofenadine (ALLEGRA) 180 MG tablet [id number] No Take 1 tablet (180 mg total) by mouth daily. [name], PA-C Taking Expired 11/06/22 2359   fluticasone (FLONASE) 50 MCG/ACT nasal spray [id number] No Place 1 spray into both nostrils daily. Begin by using 2 sprays in each nare daily for 3 to 5 days, then decrease to 1 spray in each nare daily. [name], PA-C Taking Active   Discontinued 05/09/20 1429   Fluticasone-Umeclidin-Vilant (TRELEGY ELLIPTA) 100-62.5-25 MCG/ACT AEPB [id number] No INHALE 1 PUFF ONCE DAILY Strength: 100-62.5-25 MCG/ACT Lacinda Axon, MD Taking Active Self  furosemide (LASIX) 40 MG tablet [id number] No Take 1 tablet (40 mg total) by mouth as needed for fluid or edema. Dorethea Clan, DO Taking Active   gabapentin (NEURONTIN) 300 MG capsule [id number] No Take 2 capsules (600 mg total) by  mouth in the morning AND 3 capsules (900 mg total) at bedtime. [name], MD Taking Active   glucose blood (ACCU-CHEK GUIDE) test strip [id number] No Check blood sugar 2 times per day [name], MD Taking Active Self  insulin glargine-yfgn (SEMGLEE) 100 UNIT/ML Pen [id number] No Inject 30 Units into the skin at bedtime. [name], MD Taking Active   losartan-hydrochlorothiazide [facility]) 100-25 MG tablet [id number] No Take 1 tablet by mouth daily. [name], MD Taking Active Self  metFORMIN (GLUCOPHAGE-XR) 500 MG 24 hr tablet [id number] No Take 1 tablet (500 mg total) by mouth 2 (two) times daily. [name], MD Taking Active   methocarbamol (ROBAXIN) 500 MG tablet [id number] No Take 1 tablet (500 mg total) by mouth every 6 (six) hours as needed for muscle spasms. [name], MD Taking Active   metoprolol succinate (TOPROL XL) 50 MG 24 hr tablet [id number] No Take 1 tablet (50 mg total) by mouth daily. Take with or immediately following a meal. [name], MD Taking Active Self  montelukast (SINGULAIR) 10 MG tablet [id number] No Take 1 tablet (10 mg total) by mouth at bedtime. [name], MD Taking Active   Potassium 99 MG TABS [id number] No Take 99 mg by mouth daily. [provider] Taking Active Self  senna-docusate (SENNA-TIME S) 8.6-50 MG tablet [id number] No Take 1 tablet by mouth at bedtime  as needed for mild constipation. [name], MD Taking Active   traMADol Veatrice Bourbon) 50 MG tablet [id number] No Take 1 tablet (50 mg total) by mouth every 6 (six) hours as needed. Hazel Sams, PA-C Taking Active   traZODone (DESYREL) 50 MG tablet [id number]  Take 1-2 tablets (50-100 mg total) by mouth at bedtime. Salley Slaughter, NP  Active   VENTOLIN HFA 108 (90 Base) MCG/ACT inhaler [id number] No INHALE 2 PUFFS BY MOUTH EVERY 6 HOURS AS NEEDED FOR WHEEZING FOR SHORTNESS OF Loyal Gambler, MD Taking Active             Patient  Active Problem List   Diagnosis Date Noted   Diabetic neuropathy ([place]) 08/29/2022   Vaginal lesion 08/29/2022   PTSD (post-traumatic stress disorder) 06/24/2022   GAD (generalized anxiety disorder) 06/24/2022   Major depressive disorder, recurrent episode, moderate ([place]) 06/24/2022   Urinary incontinence 06/20/2022   Intertrigo 06/20/2022   Arthritis of left shoulder region    S/P reverse total shoulder arthroplasty, 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   Healthcare maintenance 11/17/2021   Spondylolisthesis, lumbar region 02/11/2021    Class: Acute   Malignant neoplasm of upper-outer quadrant of right breast in female, estrogen receptor positive ([place]) 06/10/2018   Restless leg syndrome 02/04/2018   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   Depression 08/06/2015   Hypertension associated with diabetes ([place])    Aortic stenosis    Chronic diastolic congestive heart failure ([place])    COPD (chronic obstructive pulmonary disease) ([place]) 09/21/2013   Obstructive sleep apnea 09/21/2013   Nocturnal hypoxemia 09/12/2013   Hyperlipidemia 11/30/2012   History of pulmonary embolus ([place]) 07/09/2012   Normocytic anemia 02/08/2012   Type 2 diabetes mellitus without complication, without long-term current use of insulin ([place]) 02/08/2012   Asthma 02/08/2012   GERD (gastroesophageal reflux disease)    CAD (coronary artery disease) 12/08/2011    Conditions to be addressed/monitored per PCP order:  Depression  Care Plan : LCSW Plan of Care  Updates made by Greg Cutter, LCSW since 11/07/2022 12:00 AM     Problem: Depression Identification (Depression)      Long-Range Goal: Depressive Symptoms Identified   Start Date: 04/17/2022  Recent Progress: On track  Note:   Long-Range Goal: I want to start mental health treatment    Start Date: 04/17/2022  Priority: High  Note:   Priority: High   Timeframe:  Long-Range Goal Priority:  High Start Date:   04/17/22                Expected End Date:  ongoing                  Follow Up Date--01/07/22 at 11 am  - check out counseling - keep 90 percent of counseling appointments - schedule counseling appointment    Why is this important?             Beating depression may take some time.            If you don't feel better right away, don't give up on your treatment plan.     Current barriers:   Chronic Mental Health needs related to depression, stress and anxiety. Patient requires Support, Education, Resources, Referrals, Advocacy, and Care Coordination, in order to meet Unmet Mental Health Needs. Patient will implement clinical interventions discussed today to decrease symptoms of depression and increase knowledge and/or ability of: coping skills. Mental Health Concerns and Social Isolation Patient lacks knowledge of available  community counseling agencies and resources.   Clinical Goal(s): verbalize understanding of plan for management of Anxiety, Depression, and Stress and demonstrate a reduction in symptoms. Patient will connect with a provider for ongoing mental health treatment, increase coping skills, healthy habits, self-management skills, and stress reduction        Clinical Interventions:  Assessed patient's previous and current treatment, coping skills, support system and barriers to care. Patient provided history. Patient and spouse lived in a shared home with two other adults. Patient reports that this is a current and stable living situation.  Verbalization of feelings encouraged, motivational interviewing employed Emotional support provided, positive coping strategies explored Self care/establishing healthy boundaries emphasized Patient reports that she takes medication for anxiety and depression. She reports that this has alleviated some of symptoms but not all. She reports that she still has crying spells. She needs help with finding psychiatry and counseling. She was successful in identifying triggers to anxiety and depression  symptoms, in addition, to healthy coping skills.  Patient reports significant worsening anxiety and depression impacting her ability to function appropriately and carry out daily task. Patient receives strong support from spouse but he does not have anxiety or depression and is unable to relate to her and her daily mental health struggles.  Patient is agreeable to referral to [facility] for counseling and psychiatry. [facility] LCSW made referral on 04/17/22. Patient will call [facility] tomorrow on 04/18/22 to schedule counseling and psychiatry appointments. Email sent to her with [facility]'s contact information.  LCSW provided education on relaxation techniques such as meditation, deep breathing, massage, grounding exercises or yoga that can activate the body's relaxation response and ease symptoms of stress and anxiety. LCSW ask that when pt is struggling with difficult emotions and racing thoughts that they start this relaxation response process. LCSW provided extensive education on healthy coping skills for anxiety. SW used active and reflective listening, validated patient's feelings/concerns, and provided emotional support. Patient will work on implementing appropriate self-care habits into their daily routine such as: staying positive, writing a gratitude list, drinking water, staying active around the house, taking their medications as prescribed, combating negative thoughts or emotions and staying connected with their family and friends. Positive reinforcement provided for this decision to work on this. Patient was receptive to anxiety and depression management coping skill education. She will try to increase her socialization over the next 90 days to decrease isolation.  Patient has problems with sleep disturbance. LCSW provided education on healthy sleep hygiene and what that looks like. LCSW encouraged patient to implement a night time routine into her schedule that works best for her and that she is able to maintain.  Advised patient to implement deep breathing/grounding/meditation/self-care exercises into her nightly routine to combat racing thoughts at night. LCSW encouraged patient to wake up at the same time each day, make their sleeping environment comfortable, exercise when able, to limit naps and to not eat or drink anything right before bed.   Motivational Interviewing employed Depression screen reviewed  PHQ2/ PHQ9 completed Mindfulness or Relaxation training provided Active listening / Reflection utilized  Advance Care and HCPOA education provided Emotional Support Provided Problem [place] strategies reviewed Provided psychoeducation for mental health needs  Provided brief CBT  Reviewed mental health medications and discussed importance of compliance:  Quality of sleep assessed & Sleep Hygiene techniques promoted  Participation in counseling encouraged  Verbalization of feelings encouraged  Suicidal Ideation/Homicidal Ideation assessed: Patient denies SI/HI  Review resources, discussed options and provided  patient information about  [place] care team collaboration (see longitudinal plan of care) UPDATE 05/21/22- Patient was successfully set up with counseling at [facility] but was not set up with psychiatry. She is in need of both services. Patient has an upcoming counseling session at [facility] on 06/24/22. [facility] LCSW completed joint call with patient to [facility] but was unable to reach anyone. [facility] LCSW left a voice message asking for them to return call to patient to get her scheduled with psychiatry as [facility] LCSW ask for both psychiatry and counseling in referral. [facility] LCSW ask if they needed an additional referral to please let [facility] LCSW know so this can be completed. [facility] LCSW sent patient an email with [facility]'s contact information including their address as well as a list of coping skills for depression and anxiety. Patient's spouse has multiple health conditions and  tells patient that he "wishes to die" often which concerns her. [facility] LCSW provided emotional support and brief coping skill education on stress management.  UPDATE 06/12/22- Patient successfully contacted [facility] and set up appointments for both psychiatry and counseling. Patient is eager to start treatment. Brief self-care education provided to patient. Patient is agreeable to contact [facility] LCSW directly if needed. [facility] LCSW will make one last follow up on call to ensure that patient was successfully connected to a long term mental health provider. Patient denies any current crises or concerns.  UPDATE 07/18/22- Patient has not been set up with counseling at [facility] and she has completed her first counseling session but she has not been set up with psychiatry still. She has made several calls to agency but has been unsuccessful in reaching them. Patient has an in person appointment at [facility] on 07/30/22 and she will ask them to enroll her in psychiatry at that time. [facility] LCSW will follow up and continue to support patient as needed. UPDATE 08/01/22- Patient reports that she is doing extremely well and that her mood has lifted over the last few weeks. She reports that her first counselor appointment well very well and she was able to build rapport with him. She will see her counselor 2X per month. Her next appointment for therapy is on 08/20/22. Patient has her initial appointment with psychiatry is on the 08/12/22. UPDATE- Patient successfully completed her psychiatry appointment and has a follow up scheduled in two months. However, patient reports having ongoing issues with sleep even with the medication and reports needing this medication to be adjusted. Pt will discuss this concern with her therapist on 09/09/22 but was encouraged to call [facility] before then if needed to address this issue. [facility] LCSW will follow up in two weeks. UPDATE- Patient expressed to her counselor about her ongoing issues with her sleep and he informed her  to tell her psychiatrist. [facility] LCSW sent message to her psychiatrist with this medication dosage adjustment request. [facility] LCSW educated patient on healthy sleep hygiene. Patient denies any current crises. Patient is agreeable to contact [facility] herself to inquire about her ongoing sleep issues. UPDATE 10/17- Patient continues to struggle with ongoing sleep issues and has decided not to call her psychiatrist but wait until her scheduled appointment on 11/06/22 to address these concerns. [facility] LCSW suggested against this as patient will not been seen in over 4 weeks but patient reports that she can manage until then. Healthy sleep and hygiene techniques were provided to patient. Patient will continue to try as much healthy self-care into her daily routine to combat these negative  symptoms. [facility] LCSW will follow up the day after her psychiatry appointment. UPDATE- Patient met with counselor and psychiatrist yesterday. She reports that there was an adjustment in her medicine in order to improve her sleep. Patient reports that her sleep has somewhat improved. She reports that she was able to sign up successfully for Social Security.   Patient Goals/Self-Care Activities: Over the next 120 days Attend scheduled medical appointments Utilize healthy coping skills and supportive resources discussed Contact PCP with any questions or concerns Keep 90 percent of counseling appointments Call your insurance provider for more information about your Enhanced Benefits  Check out counseling resources provided  Begin personal counseling with LCSW, to reduce and manage symptoms of Depression and Stress, until well-established with mental health provider Accept all calls from representative with [facility] in an effort to establish ongoing mental health counseling and supportive services. Incorporate into daily practice - relaxation techniques, deep breathing exercises, and mindfulness meditation strategies. Talk about feelings with  friends, family members, spiritual advisor, etc. Contact LCSW directly [phone]), if you have questions, need assistance, or if additional social work needs are identified between now and our next scheduled telephone outreach call. Call 988 for mental health hotline/crisis line if needed (24/7 available) Try techniques to reduce symptoms of anxiety/negative thinking (deep breathing, distraction, positive self talk, etc)  - develop a personal safety plan - develop a plan to deal with triggers like holidays, anniversaries - exercise at least 2 to 3 times per week - have a plan for how to handle bad days - journal feelings and what helps to feel better or worse - spend time or talk with others at least 2 to 3 times per week - watch for early signs of feeling worse - begin personal counseling - call and visit an old friend - check out volunteer opportunities - join a support group - laugh; watch a funny movie or comedian - learn and use visualization or guided imagery - perform a random act of kindness - practice relaxation or meditation daily - start or continue a personal journal - practice positive thinking and self-talk -continue with compliance of taking medication        05/21/2022   12:53 PM 04/17/2022    3:17 PM 11/14/2021    4:02 PM 08/21/2021    4:33 PM 12/10/2020    4:00 PM  Depression screen PHQ 2/9  Decreased Interest _0 0 1  Down, Depressed, Hopeless _1 0 0  PHQ - 2 Score _2 0 1  Altered sleeping _3 Tired, decreased energy _4 Change in appetite _5 Feeling bad or failure about yourself  0 1 0   0  Trouble concentrating 0 0 0   0  Moving slowly or fidgety/restless 0   0   0  Suicidal thoughts 0 0 0   0  PHQ-9 Score _6 Difficult doing work/chores Somewhat difficult Somewhat difficult Not difficult at all   Not difficult at all        24- Hour Availability:    [facility]  [address]  [place], [place] Crisis [phone]   [facility] [employer] [place]  [phone]    [place]  [phone] (after hours)   Therapeutic Alternative/Mobile Crisis  [phone]   [place] National Suicide Hotline  934-449-3188 (TALK) OR 988   Call 911 or go to emergency room   [facility]  [phone]);  [place] and [employer]  [phone]); [place], [place], [place], [place], [place], [place], [place]           Follow up:  Patient agrees to Care Plan and Follow-up.  Plan: The Managed Medicaid care management team will reach out to the patient again over the next 90 days.  Date/time of next scheduled Social Work care management/care coordination outreach:  01/07/22 at 11 am  [name], BSW, MSW, [place] Medicaid LCSW [email or web address] Phone: [phone]

## 2022-11-07 NOTE — Patient Outreach (Signed)
Care Coordination  11/07/2022  [name] Jan 29, 1961 [id number]  RNCM called patient to provide updated DME information.  Patient provided with information for [place] and Mobility to help obtain motorized wheelchair.  All questions answered.  Patient to call back if needs further assistance.    Aida Raider RN, BSN [place]  [facility] Curator - Managed Medicaid High Risk [phone].

## 2022-11-07 NOTE — Patient Instructions (Signed)
Visit Information  [name] was given information about Medicaid Managed Care team care coordination services as a part of their [facility] Medicaid benefit. [name] verbally consented to engagement with the [facility] Managed Care team.   If you are experiencing a medical emergency, please call 911 or report to your local emergency department or urgent care.   If you have a non-emergency medical problem during routine business hours, please contact your provider's office and ask to speak with a nurse.   For questions related to your [facility] health plan, please call: [phone] or go here:[email or web address]  If you would like to schedule transportation through your [facility] plan, please call the following number at least 2 days in advance of your appointment: [phone].  You can also use the MTM portal or MTM mobile app to manage your rides. For the portal, please go to [email or web address].  Call the [place] at [phone], at any time, 24 hours a day, 7 days a week. If you are in danger or need immediate medical attention call 911.  If you would like help to quit smoking, call 1-800-QUIT-NOW 567 749 0938) OR Espaol: 1-855-Djelo-Ya (5-366-440-3474) o para ms informacin haga clic aqu or Text READY to 200-400 to register via text  Following is a copy of your plan of care:  Care Plan : LCSW Plan of Care  Updates made by Greg Cutter, LCSW since 11/07/2022 12:00 AM     Problem: Depression Identification (Depression)      Long-Range Goal: Depressive Symptoms Identified   Start Date: 04/17/2022  Recent Progress: On track  Note:   Long-Range Goal: I want to start mental health treatment    Start Date: 04/17/2022  Priority: High  Note:   Priority: High   Timeframe:  Long-Range Goal Priority:  High Start Date:   04/17/22                Expected End Date:  ongoing                 Follow Up Date--01/07/22 at 11  am  - check out counseling - keep 90 percent of counseling appointments - schedule counseling appointment    Why is this important?             Beating depression may take some time.            If you don't feel better right away, don't give up on your treatment plan.     Current barriers:   Chronic Mental Health needs related to depression, stress and anxiety. Patient requires Support, Education, Resources, Referrals, Advocacy, and Care Coordination, in order to meet Unmet Mental Health Needs. Patient will implement clinical interventions discussed today to decrease symptoms of depression and increase knowledge and/or ability of: coping skills. Mental Health Concerns and Social Isolation Patient lacks knowledge of available community counseling agencies and resources.   Clinical Goal(s): verbalize understanding of plan for management of Anxiety, Depression, and Stress and demonstrate a reduction in symptoms. Patient will connect with a provider for ongoing mental health treatment, increase coping skills, healthy habits, self-management skills, and stress reduction        Patient Goals/Self-Care Activities: Over the next 120 days Attend scheduled medical appointments Utilize healthy coping skills and supportive resources discussed Contact PCP with any questions or concerns Keep 90 percent of counseling appointments Call your insurance provider for more information about your Enhanced Benefits  Check out counseling resources provided  Begin personal counseling  with LCSW, to reduce and manage symptoms of Depression and Stress, until well-established with mental health provider Accept all calls from representative with [facility] in an effort to establish ongoing mental health counseling and supportive services. Incorporate into daily practice - relaxation techniques, deep breathing exercises, and mindfulness meditation strategies. Talk about feelings with friends, family members, spiritual  advisor, etc. Contact LCSW directly [phone]), if you have questions, need assistance, or if additional social work needs are identified between now and our next scheduled telephone outreach call. Call [phone] for mental health hotline/crisis line if needed (24/7 available) Try techniques to reduce symptoms of anxiety/negative thinking (deep breathing, distraction, positive self talk, etc)  - develop a personal safety plan - develop a plan to deal with triggers like holidays, anniversaries - exercise at least 2 to 3 times per week - have a plan for how to handle bad days - journal feelings and what helps to feel better or worse - spend time or talk with others at least 2 to 3 times per week - watch for early signs of feeling worse - begin personal counseling - call and visit an old friend - check out volunteer opportunities - join a support group - laugh; watch a funny movie or comedian - learn and use visualization or guided imagery - perform a random act of kindness - practice relaxation or meditation daily - start or continue a personal journal - practice positive thinking and self-talk -continue with compliance of taking medication        05/21/2022   12:53 PM 04/17/2022    3:17 PM 11/14/2021    4:02 PM 08/21/2021    4:33 PM 12/10/2020    4:00 PM  Depression screen PHQ 2/9  Decreased Interest '2 2 1 '$ 0 1  Down, Depressed, Hopeless '2 2 1 '$ 0 0  PHQ - 2 Score '4 4 2 '$ 0 1  Altered sleeping '3 3 3   1  '$ Tired, decreased energy '3 3 1   1  '$ Change in appetite '3 3 1   1  '$ Feeling bad or failure about yourself  0 1 0   0  Trouble concentrating 0 0 0   0  Moving slowly or fidgety/restless 0   0   0  Suicidal thoughts 0 0 0   0  PHQ-9 Score '13 14 7   4  '$ Difficult doing work/chores Somewhat difficult Somewhat difficult Not difficult at all   Not difficult at all        24- Hour Availability:    [facility]  [address] [place], [place]  [place] [facility] [phone]   [facility] [employer] [phone]   [place]  [phone]    [place]  [phone] (after hours)   Therapeutic Alternative/Mobile Crisis   [phone]   [place] National Suicide Hotline  561-748-5036 (TALK) OR 988   Call 911 or go to emergency room   [facility]  [phone]);  [place] and [employer]  [phone]); [place], [place], [place], [place], [place], [place], [place]

## 2022-11-10 ENCOUNTER — Telehealth: Payer: Self-pay

## 2022-11-10 DIAGNOSIS — M4316 Spondylolisthesis, lumbar region: Secondary | ICD-10-CM

## 2022-11-10 NOTE — Telephone Encounter (Signed)
Patient called she is requesting a order for a lift chair and scooter to be sent to [facility]. Telephone: [phone] Fax: [phone]

## 2022-11-14 NOTE — Addendum Note (Signed)
Addended bySanjuan Dame on: 11/14/2022 11:26 AM   Modules accepted: Orders

## 2022-11-14 NOTE — Telephone Encounter (Signed)
DME referrals placed for both to [facility]

## 2022-11-16 ENCOUNTER — Inpatient Hospital Stay (HOSPITAL_COMMUNITY)
Admission: EM | Admit: 2022-11-16 | Discharge: 2022-11-19 | DRG: 853 | Disposition: A | Discharge status: Home / Self Care | Payer: Medicaid Other | Attending: Infectious Diseases | Admitting: Infectious Diseases

## 2022-11-16 ENCOUNTER — Emergency Department (HOSPITAL_COMMUNITY): Payer: Medicaid Other

## 2022-11-16 ENCOUNTER — Encounter (HOSPITAL_COMMUNITY)
Admission: EM | Disposition: A | Discharge status: Home / Self Care | Payer: Self-pay | Source: Home / Self Care | Attending: Infectious Diseases

## 2022-11-16 ENCOUNTER — Other Ambulatory Visit: Payer: Self-pay

## 2022-11-16 ENCOUNTER — Encounter (HOSPITAL_COMMUNITY): Payer: Self-pay

## 2022-11-16 ENCOUNTER — Emergency Department (HOSPITAL_COMMUNITY): Payer: Medicaid Other | Admitting: Anesthesiology

## 2022-11-16 ENCOUNTER — Emergency Department (HOSPITAL_BASED_OUTPATIENT_CLINIC_OR_DEPARTMENT_OTHER): Payer: Medicaid Other | Admitting: Anesthesiology

## 2022-11-16 DIAGNOSIS — Z743 Need for continuous supervision: Secondary | ICD-10-CM | POA: Diagnosis not present

## 2022-11-16 DIAGNOSIS — E1159 Type 2 diabetes mellitus with other circulatory complications: Secondary | ICD-10-CM

## 2022-11-16 DIAGNOSIS — I152 Hypertension secondary to endocrine disorders: Secondary | ICD-10-CM

## 2022-11-16 DIAGNOSIS — R079 Chest pain, unspecified: Secondary | ICD-10-CM | POA: Diagnosis not present

## 2022-11-16 DIAGNOSIS — N138 Other obstructive and reflux uropathy: Secondary | ICD-10-CM

## 2022-11-16 DIAGNOSIS — N130 Hydronephrosis with ureteropelvic junction obstruction: Secondary | ICD-10-CM | POA: Diagnosis not present

## 2022-11-16 DIAGNOSIS — Z87891 Personal history of nicotine dependence: Secondary | ICD-10-CM

## 2022-11-16 DIAGNOSIS — T68XXXA Hypothermia, initial encounter: Secondary | ICD-10-CM | POA: Diagnosis not present

## 2022-11-16 DIAGNOSIS — R110 Nausea: Secondary | ICD-10-CM | POA: Diagnosis not present

## 2022-11-16 DIAGNOSIS — I110 Hypertensive heart disease with heart failure: Secondary | ICD-10-CM | POA: Diagnosis present

## 2022-11-16 DIAGNOSIS — Z96651 Presence of right artificial knee joint: Secondary | ICD-10-CM | POA: Diagnosis present

## 2022-11-16 DIAGNOSIS — I959 Hypotension, unspecified: Secondary | ICD-10-CM | POA: Diagnosis not present

## 2022-11-16 DIAGNOSIS — E1149 Type 2 diabetes mellitus with other diabetic neurological complication: Secondary | ICD-10-CM | POA: Diagnosis not present

## 2022-11-16 DIAGNOSIS — Z923 Personal history of irradiation: Secondary | ICD-10-CM

## 2022-11-16 DIAGNOSIS — Z952 Presence of prosthetic heart valve: Secondary | ICD-10-CM

## 2022-11-16 DIAGNOSIS — A4151 Sepsis due to Escherichia coli [E. coli]: Principal | ICD-10-CM | POA: Diagnosis present

## 2022-11-16 DIAGNOSIS — N390 Urinary tract infection, site not specified: Secondary | ICD-10-CM | POA: Diagnosis not present

## 2022-11-16 DIAGNOSIS — Z794 Long term (current) use of insulin: Secondary | ICD-10-CM

## 2022-11-16 DIAGNOSIS — N200 Calculus of kidney: Secondary | ICD-10-CM | POA: Diagnosis not present

## 2022-11-16 DIAGNOSIS — F32A Depression, unspecified: Secondary | ICD-10-CM | POA: Diagnosis present

## 2022-11-16 DIAGNOSIS — J449 Chronic obstructive pulmonary disease, unspecified: Secondary | ICD-10-CM | POA: Diagnosis not present

## 2022-11-16 DIAGNOSIS — R6521 Severe sepsis with septic shock: Secondary | ICD-10-CM

## 2022-11-16 DIAGNOSIS — N136 Pyonephrosis: Secondary | ICD-10-CM | POA: Diagnosis present

## 2022-11-16 DIAGNOSIS — F419 Anxiety disorder, unspecified: Secondary | ICD-10-CM | POA: Diagnosis present

## 2022-11-16 DIAGNOSIS — Z8249 Family history of ischemic heart disease and other diseases of the circulatory system: Secondary | ICD-10-CM

## 2022-11-16 DIAGNOSIS — G2581 Restless legs syndrome: Secondary | ICD-10-CM | POA: Diagnosis present

## 2022-11-16 DIAGNOSIS — N201 Calculus of ureter: Secondary | ICD-10-CM

## 2022-11-16 DIAGNOSIS — R0602 Shortness of breath: Secondary | ICD-10-CM | POA: Diagnosis not present

## 2022-11-16 DIAGNOSIS — I2510 Atherosclerotic heart disease of native coronary artery without angina pectoris: Secondary | ICD-10-CM

## 2022-11-16 DIAGNOSIS — R000 Tachycardia, unspecified: Secondary | ICD-10-CM | POA: Diagnosis not present

## 2022-11-16 DIAGNOSIS — Z803 Family history of malignant neoplasm of breast: Secondary | ICD-10-CM

## 2022-11-16 DIAGNOSIS — Z7902 Long term (current) use of antithrombotics/antiplatelets: Secondary | ICD-10-CM

## 2022-11-16 DIAGNOSIS — Z825 Family history of asthma and other chronic lower respiratory diseases: Secondary | ICD-10-CM

## 2022-11-16 DIAGNOSIS — Z86711 Personal history of pulmonary embolism: Secondary | ICD-10-CM

## 2022-11-16 DIAGNOSIS — N1330 Unspecified hydronephrosis: Secondary | ICD-10-CM | POA: Diagnosis not present

## 2022-11-16 DIAGNOSIS — I509 Heart failure, unspecified: Secondary | ICD-10-CM

## 2022-11-16 DIAGNOSIS — G4733 Obstructive sleep apnea (adult) (pediatric): Secondary | ICD-10-CM | POA: Diagnosis present

## 2022-11-16 DIAGNOSIS — I252 Old myocardial infarction: Secondary | ICD-10-CM | POA: Diagnosis not present

## 2022-11-16 DIAGNOSIS — Z6841 Body Mass Index (BMI) 40.0 and over, adult: Secondary | ICD-10-CM

## 2022-11-16 DIAGNOSIS — E876 Hypokalemia: Secondary | ICD-10-CM | POA: Diagnosis not present

## 2022-11-16 DIAGNOSIS — G9341 Metabolic encephalopathy: Secondary | ICD-10-CM | POA: Diagnosis present

## 2022-11-16 DIAGNOSIS — Z955 Presence of coronary angioplasty implant and graft: Secondary | ICD-10-CM

## 2022-11-16 DIAGNOSIS — Z7985 Long-term (current) use of injectable non-insulin antidiabetic drugs: Secondary | ICD-10-CM

## 2022-11-16 DIAGNOSIS — Z7951 Long term (current) use of inhaled steroids: Secondary | ICD-10-CM

## 2022-11-16 DIAGNOSIS — I5032 Chronic diastolic (congestive) heart failure: Secondary | ICD-10-CM | POA: Diagnosis present

## 2022-11-16 DIAGNOSIS — Z79899 Other long term (current) drug therapy: Secondary | ICD-10-CM

## 2022-11-16 DIAGNOSIS — E785 Hyperlipidemia, unspecified: Secondary | ICD-10-CM | POA: Diagnosis present

## 2022-11-16 DIAGNOSIS — E1140 Type 2 diabetes mellitus with diabetic neuropathy, unspecified: Secondary | ICD-10-CM | POA: Diagnosis present

## 2022-11-16 DIAGNOSIS — Z853 Personal history of malignant neoplasm of breast: Secondary | ICD-10-CM

## 2022-11-16 DIAGNOSIS — R109 Unspecified abdominal pain: Secondary | ICD-10-CM | POA: Diagnosis not present

## 2022-11-16 DIAGNOSIS — J441 Chronic obstructive pulmonary disease with (acute) exacerbation: Secondary | ICD-10-CM

## 2022-11-16 DIAGNOSIS — Z833 Family history of diabetes mellitus: Secondary | ICD-10-CM

## 2022-11-16 DIAGNOSIS — Z7984 Long term (current) use of oral hypoglycemic drugs: Secondary | ICD-10-CM

## 2022-11-16 DIAGNOSIS — Z9981 Dependence on supplemental oxygen: Secondary | ICD-10-CM

## 2022-11-16 DIAGNOSIS — R0689 Other abnormalities of breathing: Secondary | ICD-10-CM | POA: Diagnosis not present

## 2022-11-16 DIAGNOSIS — B9620 Unspecified Escherichia coli [E. coli] as the cause of diseases classified elsewhere: Secondary | ICD-10-CM | POA: Diagnosis present

## 2022-11-16 DIAGNOSIS — Z96612 Presence of left artificial shoulder joint: Secondary | ICD-10-CM | POA: Diagnosis present

## 2022-11-16 HISTORY — PX: CYSTOSCOPY W/ URETERAL STENT PLACEMENT: SHX1429

## 2022-11-16 HISTORY — DX: Chronic obstructive pulmonary disease with (acute) exacerbation: J44.1

## 2022-11-16 LAB — TROPONIN I (HIGH SENSITIVITY)
Troponin I (High Sensitivity): 5 ng/L (ref <18)
Troponin I (High Sensitivity): <2 ng/L (ref <18)

## 2022-11-16 LAB — BLOOD CULTURE ID PANEL (REFLEXED) - BCID2

## 2022-11-16 LAB — URINALYSIS, ROUTINE W REFLEX MICROSCOPIC
Bilirubin Urine: NEGATIVE
Glucose, UA: NEGATIVE mg/dL
Ketones, ur: NEGATIVE mg/dL
Nitrite: POSITIVE — AB
Protein, ur: NEGATIVE mg/dL
Specific Gravity, Urine: 1.013 (ref 1.005–1.030)
pH: 5 (ref 5.0–8.0)

## 2022-11-16 LAB — LACTIC ACID, PLASMA
Lactic Acid, Venous: 2.7 mmol/L (ref 0.5–1.9)
Lactic Acid, Venous: 1.8 mmol/L (ref 0.5–1.9)

## 2022-11-16 LAB — CBC WITH DIFFERENTIAL/PLATELET
Abs Immature Granulocytes: 0.06 10*3/uL (ref 0.00–0.07)
Basophils Absolute: 0 10*3/uL (ref 0.0–0.1)
Basophils Relative: 0 %
Eosinophils Absolute: 0 10*3/uL (ref 0.0–0.5)
Eosinophils Relative: 0 %
HCT: 41.5 % (ref 36.0–46.0)
Hemoglobin: 13.3 g/dL (ref 12.0–15.0)
Immature Granulocytes: 1 %
Lymphocytes Relative: 5 %
Lymphs Abs: 0.5 10*3/uL — ABNORMAL LOW (ref 0.7–4.0)
MCH: 27.8 pg (ref 26.0–34.0)
MCHC: 32 g/dL (ref 30.0–36.0)
MCV: 86.6 fL (ref 80.0–100.0)
Monocytes Absolute: 0.1 10*3/uL (ref 0.1–1.0)
Monocytes Relative: 1 %
Neutro Abs: 9.4 10*3/uL — ABNORMAL HIGH (ref 1.7–7.7)
Neutrophils Relative %: 93 %
Platelets: 204 10*3/uL (ref 150–400)
RBC: 4.79 MIL/uL (ref 3.87–5.11)
RDW: 13.8 % (ref 11.5–15.5)
WBC: 10.1 10*3/uL (ref 4.0–10.5)
nRBC: 0 % (ref 0.0–0.2)

## 2022-11-16 LAB — I-STAT CHEM 8, ED
BUN: 17 mg/dL (ref 8–23)
Calcium, Ion: 1.14 mmol/L — ABNORMAL LOW (ref 1.15–1.40)
Chloride: 99 mmol/L (ref 98–111)
Creatinine, Ser: 1 mg/dL (ref 0.44–1.00)
Glucose, Bld: 219 mg/dL — ABNORMAL HIGH (ref 70–99)
HCT: 41 % (ref 36.0–46.0)
Hemoglobin: 13.9 g/dL (ref 12.0–15.0)
Potassium: 3.7 mmol/L (ref 3.5–5.1)
Sodium: 138 mmol/L (ref 135–145)
TCO2: 29 mmol/L (ref 22–32)

## 2022-11-16 LAB — COMPREHENSIVE METABOLIC PANEL
ALT: 13 U/L (ref 0–44)
AST: 18 U/L (ref 15–41)
Albumin: 3.7 g/dL (ref 3.5–5.0)
Alkaline Phosphatase: 71 U/L (ref 38–126)
Anion gap: 16 — ABNORMAL HIGH (ref 5–15)
BUN: 16 mg/dL (ref 8–23)
CO2: 26 mmol/L (ref 22–32)
Calcium: 9.7 mg/dL (ref 8.9–10.3)
Chloride: 97 mmol/L — ABNORMAL LOW (ref 98–111)
Creatinine, Ser: 1.06 mg/dL — ABNORMAL HIGH (ref 0.44–1.00)
GFR, Estimated: 59 mL/min — ABNORMAL LOW (ref >60)
Glucose, Bld: 214 mg/dL — ABNORMAL HIGH (ref 70–99)
Potassium: 3.7 mmol/L (ref 3.5–5.1)
Sodium: 139 mmol/L (ref 135–145)
Total Bilirubin: 1.4 mg/dL — ABNORMAL HIGH (ref 0.3–1.2)
Total Protein: 7 g/dL (ref 6.5–8.1)

## 2022-11-16 LAB — PROTIME-INR
INR: 1 (ref 0.8–1.2)
Prothrombin Time: 12.6 seconds (ref 11.4–15.2)

## 2022-11-16 LAB — APTT: aPTT: 24 seconds (ref 24–36)

## 2022-11-16 LAB — GLUCOSE, CAPILLARY
Glucose-Capillary: 195 mg/dL — ABNORMAL HIGH (ref 70–99)
Glucose-Capillary: 180 mg/dL — ABNORMAL HIGH (ref 70–99)

## 2022-11-16 SURGERY — CYSTOSCOPY, WITH RETROGRADE PYELOGRAM AND URETERAL STENT INSERTION
Anesthesia: General | Site: Ureter | Laterality: Right

## 2022-11-16 MED ORDER — IPRATROPIUM-ALBUTEROL 0.5-2.5 (3) MG/3ML IN SOLN
3.0000 mL | Freq: Once | RESPIRATORY_TRACT | Status: AC
  Administered 2022-11-16: 3 mL via RESPIRATORY_TRACT
  Filled 2022-11-16: qty 3

## 2022-11-16 MED ORDER — SUGAMMADEX SODIUM 200 MG/2ML IV SOLN
INTRAVENOUS | Status: DC | PRN
  Administered 2022-11-16: 200 mg via INTRAVENOUS
  Administered 2022-11-16 (×2): 100 mg via INTRAVENOUS

## 2022-11-16 MED ORDER — FENTANYL CITRATE (PF) 250 MCG/5ML IJ SOLN
INTRAMUSCULAR | Status: DC | PRN
  Administered 2022-11-16 (×2): 50 ug via INTRAVENOUS

## 2022-11-16 MED ORDER — ATORVASTATIN CALCIUM 80 MG PO TABS
80.0000 mg | ORAL_TABLET | Freq: Every day | ORAL | Status: DC
  Administered 2022-11-16 – 2022-11-19 (×4): 80 mg via ORAL
  Filled 2022-11-16 (×4): qty 1

## 2022-11-16 MED ORDER — FLUTICASONE FUROATE-VILANTEROL 100-25 MCG/ACT IN AEPB
1.0000 | INHALATION_SPRAY | Freq: Every day | RESPIRATORY_TRACT | Status: DC
  Administered 2022-11-16 – 2022-11-19 (×5): 1 via RESPIRATORY_TRACT
  Filled 2022-11-16: qty 28

## 2022-11-16 MED ORDER — CHLORHEXIDINE GLUCONATE 0.12 % MT SOLN
15.0000 mL | Freq: Once | OROMUCOSAL | Status: AC
  Administered 2022-11-16: 15 mL via OROMUCOSAL

## 2022-11-16 MED ORDER — DIPHENHYDRAMINE HCL 50 MG/ML IJ SOLN
INTRAMUSCULAR | Status: DC | PRN
  Administered 2022-11-16: 25 mg via INTRAVENOUS

## 2022-11-16 MED ORDER — SODIUM CHLORIDE 0.9 % IR SOLN
Status: DC | PRN
  Administered 2022-11-16: 3000 mL

## 2022-11-16 MED ORDER — SUCCINYLCHOLINE CHLORIDE 200 MG/10ML IV SOSY
PREFILLED_SYRINGE | INTRAVENOUS | Status: DC | PRN
  Administered 2022-11-16: 130 mg via INTRAVENOUS

## 2022-11-16 MED ORDER — PROPOFOL 10 MG/ML IV BOLUS
INTRAVENOUS | Status: DC | PRN
  Administered 2022-11-16: 130 mg via INTRAVENOUS

## 2022-11-16 MED ORDER — ROCURONIUM BROMIDE 10 MG/ML (PF) SYRINGE
PREFILLED_SYRINGE | INTRAVENOUS | Status: DC | PRN
  Administered 2022-11-16: 40 mg via INTRAVENOUS

## 2022-11-16 MED ORDER — AMLODIPINE BESYLATE 5 MG PO TABS: 5.0000 mg | ORAL_TABLET | Freq: Every day | ORAL | Status: DC

## 2022-11-16 MED ORDER — INSULIN ASPART 100 UNIT/ML IJ SOLN
0.0000 [IU] | INTRAMUSCULAR | Status: DC
  Administered 2022-11-16 – 2022-11-17 (×6): 3 [IU] via SUBCUTANEOUS
  Administered 2022-11-17 – 2022-11-18 (×2): 5 [IU] via SUBCUTANEOUS
  Administered 2022-11-18: 2 [IU] via SUBCUTANEOUS
  Administered 2022-11-18: 5 [IU] via SUBCUTANEOUS
  Administered 2022-11-18: 3 [IU] via SUBCUTANEOUS
  Administered 2022-11-19: 5 [IU] via SUBCUTANEOUS
  Administered 2022-11-19 (×2): 3 [IU] via SUBCUTANEOUS

## 2022-11-16 MED ORDER — PHENYLEPHRINE 80 MCG/ML (10ML) SYRINGE FOR IV PUSH (FOR BLOOD PRESSURE SUPPORT)
PREFILLED_SYRINGE | INTRAVENOUS | Status: DC | PRN
  Administered 2022-11-16: 160 ug via INTRAVENOUS
  Administered 2022-11-16: 80 ug via INTRAVENOUS

## 2022-11-16 MED ORDER — LOSARTAN POTASSIUM-HCTZ 100-25 MG PO TABS: 1.0000 | ORAL_TABLET | Freq: Every day | ORAL | Status: DC

## 2022-11-16 MED ORDER — DIPHENHYDRAMINE HCL 50 MG/ML IJ SOLN
INTRAMUSCULAR | Status: AC
  Filled 2022-11-16: qty 1

## 2022-11-16 MED ORDER — ONDANSETRON HCL 4 MG/2ML IJ SOLN
INTRAMUSCULAR | Status: DC | PRN
  Administered 2022-11-16: 4 mg via INTRAVENOUS

## 2022-11-16 MED ORDER — PROPOFOL 10 MG/ML IV BOLUS
INTRAVENOUS | Status: AC
  Filled 2022-11-16: qty 20

## 2022-11-16 MED ORDER — PREDNISONE 20 MG PO TABS
40.0000 mg | ORAL_TABLET | Freq: Every day | ORAL | Status: DC
  Administered 2022-11-16 – 2022-11-19 (×4): 40 mg via ORAL
  Filled 2022-11-16 (×4): qty 2

## 2022-11-16 MED ORDER — UMECLIDINIUM BROMIDE 62.5 MCG/ACT IN AEPB: 1.0000 | INHALATION_SPRAY | Freq: Every day | RESPIRATORY_TRACT | Status: DC

## 2022-11-16 MED ORDER — GABAPENTIN 600 MG PO TABS
900.0000 mg | ORAL_TABLET | Freq: Every day | ORAL | Status: DC
  Administered 2022-11-16 – 2022-11-18 (×3): 900 mg via ORAL
  Filled 2022-11-16 (×3): qty 2

## 2022-11-16 MED ORDER — BUSPIRONE HCL 10 MG PO TABS
10.0000 mg | ORAL_TABLET | Freq: Two times a day (BID) | ORAL | Status: DC
  Administered 2022-11-16 – 2022-11-19 (×6): 10 mg via ORAL
  Filled 2022-11-16 (×6): qty 1

## 2022-11-16 MED ORDER — FENTANYL CITRATE (PF) 100 MCG/2ML IJ SOLN: 25.0000 ug | INTRAMUSCULAR | Status: DC | PRN

## 2022-11-16 MED ORDER — AZITHROMYCIN 500 MG PO TABS
500.0000 mg | ORAL_TABLET | Freq: Every day | ORAL | Status: AC
  Administered 2022-11-16: 500 mg via ORAL
  Filled 2022-11-16: qty 1

## 2022-11-16 MED ORDER — CHLORHEXIDINE GLUCONATE 0.12 % MT SOLN
OROMUCOSAL | Status: AC
  Filled 2022-11-16: qty 15

## 2022-11-16 MED ORDER — HYDROCHLOROTHIAZIDE 25 MG PO TABS: 25.0000 mg | ORAL_TABLET | Freq: Every day | ORAL | Status: DC

## 2022-11-16 MED ORDER — INSULIN ASPART 100 UNIT/ML IJ SOLN
0.0000 [IU] | INTRAMUSCULAR | Status: DC | PRN
  Administered 2022-11-16: 4 [IU] via SUBCUTANEOUS

## 2022-11-16 MED ORDER — CITALOPRAM HYDROBROMIDE 20 MG PO TABS
20.0000 mg | ORAL_TABLET | Freq: Every day | ORAL | Status: DC
  Administered 2022-11-17 – 2022-11-19 (×3): 20 mg via ORAL
  Filled 2022-11-16 (×3): qty 1

## 2022-11-16 MED ORDER — SODIUM CHLORIDE 0.9 % IV SOLN: 1.0000 g | INTRAVENOUS | Status: DC

## 2022-11-16 MED ORDER — FERROUS SULFATE 325 (65 FE) MG PO TABS
325.0000 mg | ORAL_TABLET | ORAL | Status: DC
  Administered 2022-11-16 – 2022-11-18 (×2): 325 mg via ORAL
  Filled 2022-11-16 (×4): qty 1

## 2022-11-16 MED ORDER — METOPROLOL SUCCINATE ER 50 MG PO TB24: 50.0000 mg | ORAL_TABLET | Freq: Every day | ORAL | Status: DC

## 2022-11-16 MED ORDER — ORAL CARE MOUTH RINSE: 15.0000 mL | Freq: Once | OROMUCOSAL | Status: AC

## 2022-11-16 MED ORDER — ALBUTEROL SULFATE (2.5 MG/3ML) 0.083% IN NEBU: 2.5000 mg | INHALATION_SOLUTION | RESPIRATORY_TRACT | Status: DC | PRN

## 2022-11-16 MED ORDER — AMLODIPINE-ATORVASTATIN 5-80 MG PO TABS: 1.0000 | ORAL_TABLET | Freq: Every day | ORAL | Status: DC

## 2022-11-16 MED ORDER — IPRATROPIUM-ALBUTEROL 0.5-2.5 (3) MG/3ML IN SOLN
RESPIRATORY_TRACT | Status: AC
  Administered 2022-11-16: 3 mL
  Filled 2022-11-16: qty 3

## 2022-11-16 MED ORDER — ACETAMINOPHEN 325 MG PO TABS
650.0000 mg | ORAL_TABLET | Freq: Four times a day (QID) | ORAL | Status: DC | PRN
  Administered 2022-11-16 – 2022-11-18 (×3): 650 mg via ORAL
  Filled 2022-11-16 (×3): qty 2

## 2022-11-16 MED ORDER — LACTATED RINGERS IV SOLN: INTRAVENOUS | Status: DC

## 2022-11-16 MED ORDER — EZETIMIBE 10 MG PO TABS
10.0000 mg | ORAL_TABLET | Freq: Every day | ORAL | Status: DC
  Administered 2022-11-16 – 2022-11-19 (×4): 10 mg via ORAL
  Filled 2022-11-16 (×4): qty 1

## 2022-11-16 MED ORDER — INSULIN GLARGINE-YFGN 100 UNIT/ML ~~LOC~~ SOLN
15.0000 [IU] | Freq: Every day | SUBCUTANEOUS | Status: DC
  Administered 2022-11-16 – 2022-11-18 (×3): 15 [IU] via SUBCUTANEOUS
  Filled 2022-11-16 (×4): qty 0.15

## 2022-11-16 MED ORDER — MEPERIDINE HCL 25 MG/ML IJ SOLN: 6.2500 mg | INTRAMUSCULAR | Status: DC | PRN

## 2022-11-16 MED ORDER — MIDAZOLAM HCL 2 MG/2ML IJ SOLN
INTRAMUSCULAR | Status: AC
  Filled 2022-11-16: qty 2

## 2022-11-16 MED ORDER — IOHEXOL 350 MG/ML SOLN
100.0000 mL | Freq: Once | INTRAVENOUS | Status: AC | PRN
  Administered 2022-11-16: 100 mL via INTRAVENOUS

## 2022-11-16 MED ORDER — LACTATED RINGERS IV BOLUS
500.0000 mL | Freq: Once | INTRAVENOUS | Status: AC
  Administered 2022-11-16: 500 mL via INTRAVENOUS

## 2022-11-16 MED ORDER — HYDROMORPHONE HCL 1 MG/ML IJ SOLN
1.0000 mg | Freq: Once | INTRAMUSCULAR | Status: AC
  Administered 2022-11-16: 1 mg via INTRAVENOUS
  Filled 2022-11-16: qty 1

## 2022-11-16 MED ORDER — AZITHROMYCIN 250 MG PO TABS
250.0000 mg | ORAL_TABLET | Freq: Every day | ORAL | Status: DC
  Administered 2022-11-17: 250 mg via ORAL
  Filled 2022-11-16 (×2): qty 1

## 2022-11-16 MED ORDER — LIDOCAINE 2% (20 MG/ML) 5 ML SYRINGE
INTRAMUSCULAR | Status: DC | PRN
  Administered 2022-11-16: 100 mg via INTRAVENOUS

## 2022-11-16 MED ORDER — SODIUM CHLORIDE 0.9 % IV SOLN
2.0000 g | INTRAVENOUS | Status: AC
  Administered 2022-11-17 – 2022-11-19 (×3): 2 g via INTRAVENOUS
  Filled 2022-11-16 (×3): qty 20

## 2022-11-16 MED ORDER — SENNOSIDES-DOCUSATE SODIUM 8.6-50 MG PO TABS: 1.0000 | ORAL_TABLET | Freq: Every evening | ORAL | Status: DC | PRN

## 2022-11-16 MED ORDER — LOSARTAN POTASSIUM 50 MG PO TABS: 100.0000 mg | ORAL_TABLET | Freq: Every day | ORAL | Status: DC

## 2022-11-16 MED ORDER — GABAPENTIN 300 MG PO CAPS
600.0000 mg | ORAL_CAPSULE | Freq: Every day | ORAL | Status: DC
  Administered 2022-11-17 – 2022-11-19 (×3): 600 mg via ORAL
  Filled 2022-11-16 (×3): qty 2

## 2022-11-16 MED ORDER — ACETAMINOPHEN 650 MG RE SUPP: 650.0000 mg | Freq: Four times a day (QID) | RECTAL | Status: DC | PRN

## 2022-11-16 MED ORDER — IPRATROPIUM-ALBUTEROL 0.5-2.5 (3) MG/3ML IN SOLN: 3.0000 mL | RESPIRATORY_TRACT | Status: DC

## 2022-11-16 MED ORDER — TRAZODONE HCL 50 MG PO TABS
50.0000 mg | ORAL_TABLET | Freq: Every evening | ORAL | Status: DC | PRN
  Administered 2022-11-17 – 2022-11-18 (×2): 50 mg via ORAL
  Filled 2022-11-16 (×2): qty 1

## 2022-11-16 MED ORDER — PROMETHAZINE HCL 25 MG/ML IJ SOLN: 6.2500 mg | INTRAMUSCULAR | Status: DC | PRN

## 2022-11-16 MED ORDER — SODIUM CHLORIDE 0.9 % IV SOLN
1.0000 g | Freq: Once | INTRAVENOUS | Status: AC
  Administered 2022-11-16: 1 g via INTRAVENOUS
  Filled 2022-11-16: qty 10

## 2022-11-16 MED ORDER — FENTANYL CITRATE (PF) 250 MCG/5ML IJ SOLN
INTRAMUSCULAR | Status: AC
  Filled 2022-11-16: qty 5

## 2022-11-16 SURGICAL SUPPLY — 23 items
BAG COUNTER SPONGE SURGICOUNT (BAG) ×1 IMPLANT
BAG URO CATCHER STRL LF (MISCELLANEOUS) ×1 IMPLANT
BASKET LASER NITINOL 1.9FR (BASKET) IMPLANT
CATH URET 5FR 28IN OPEN ENDED (CATHETERS) ×1 IMPLANT
CATH URET DUAL LUMEN 6-10FR 50 (CATHETERS) IMPLANT
ELECT REM PT RETURN 9FT ADLT (ELECTROSURGICAL)
ELECTRODE REM PT RTRN 9FT ADLT (ELECTROSURGICAL) IMPLANT
EXTRACTOR STONE 1.7FRX115CM (UROLOGICAL SUPPLIES) IMPLANT
FIBER LASER TRAC TIP (UROLOGICAL SUPPLIES) IMPLANT
GLOVE BIO SURGEON STRL SZ7.5 (GLOVE) ×1 IMPLANT
GOWN STRL REUS W/ TWL XL LVL3 (GOWN DISPOSABLE) ×1 IMPLANT
GOWN STRL REUS W/TWL XL LVL3 (GOWN DISPOSABLE) ×1
GUIDEWIRE ANG ZIPWIRE 038X150 (WIRE) IMPLANT
GUIDEWIRE STR DUAL SENSOR (WIRE) ×1 IMPLANT
IV NS IRRIG 3000ML ARTHROMATIC (IV SOLUTION) ×2 IMPLANT
KIT TURNOVER KIT B (KITS) ×1 IMPLANT
MANIFOLD NEPTUNE II (INSTRUMENTS) ×1 IMPLANT
NS IRRIG 1000ML POUR BTL (IV SOLUTION) ×1 IMPLANT
PACK CYSTO (CUSTOM PROCEDURE TRAY) ×1 IMPLANT
SET IRRIG Y TYPE TUR BLADDER L (SET/KITS/TRAYS/PACK) ×1 IMPLANT
SOL PREP POV-IOD 4OZ 10% (MISCELLANEOUS) ×1 IMPLANT
STENT URET 6FRX24 CONTOUR (STENTS) IMPLANT
TUBE CONNECTING 12X1/4 (SUCTIONS) IMPLANT

## 2022-11-16 NOTE — ED Provider Notes (Signed)
[facility] EMERGENCY DEPARTMENT Provider Note   CSN: [id number] Arrival date & time: 11/16/22  7026     History  Chief Complaint  Patient presents with   Abdominal Pain    [name] is a 61 y.o. female.  HPI 61 year old female with a history of asthma/COPD (on 2 L nasal cannula at night as needed), hypertension, hyperlipidemia, MI/PE, fibroids, renal stones presents to the ER with right-sided abdominal pain which came on acutely this morning.  I said several episodes of nonbloody emesis.  Reports a history of kidney stones and states that this feels similar.  Also states "I might have a UTI".  No known fevers.  No cough.    Home Medications Prior to Admission medications   Medication Sig Start Date End Date Taking? Authorizing Provider  albuterol (PROVENTIL) (2.5 MG/3ML) 0.083% nebulizer solution USE 1 VIAL IN NEBULIZER EVERY 4 HOURS AS NEEDED FOR WHEEZING OR SHORTNESS OF BREATH Patient taking differently: Take 2.5 mg by nebulization every 4 (four) hours as needed for wheezing or shortness of breath. 10/21/22  Yes Lacinda Axon, MD  amlodipine-atorvastatin (CADUET) 5-80 MG tablet Take 1 tablet by mouth daily. 11/17/21 11/17/22 Yes [name], MD  busPIRone (BUSPAR) 10 MG tablet Take 1 tablet (10 mg total) by mouth 3 (three) times daily. Patient taking differently: Take 10 mg by mouth 2 (two) times daily. 11/06/22  Yes Eulis Canner E, NP  citalopram (CELEXA) 20 MG tablet TAKE 1 TABLET BY MOUTH ONCE DAILY. DO NOT TAKE WHILE TAKING DULOXETINE Patient taking differently: Take 20 mg by mouth daily. TAKE 1 TABLET BY MOUTH ONCE DAILY. DO NOT TAKE WHILE TAKING DULOXETINE 11/06/22  Yes Eulis Canner E, NP  clopidogrel (PLAVIX) 75 MG tablet TAKE 1 TABLET BY MOUTH IN THE MORNING Patient taking differently: Take 75 mg by mouth daily. 01/21/22  Yes [name], MD  Dulaglutide (TRULICITY) 3 VZ/8.5YI SOPN Inject 3 mg as directed once a  week. Patient taking differently: Inject 3 mg as directed once a week. Saturdays 08/27/22  Yes [name], MD  Evolocumab (REPATHA SURECLICK) 502 MG/ML SOAJ INJECT 1 PEN INTO THE SKIN EVERY 14 DAYS Patient taking differently: Inject 140 mg into the skin every 14 (fourteen) days. 04/25/22  Yes Burnell Blanks, MD  exemestane (AROMASIN) 25 MG tablet Take 1 tablet (25 mg total) by mouth daily after breakfast. 02/03/22  Yes Iruku, Arletha Pili, MD  ezetimibe (ZETIA) 10 MG tablet Take 1 tablet (10 mg total) by mouth daily. 02/25/22 11/16/22 Yes [name], PA-C  ferrous sulfate 325 (65 FE) MG tablet Take 1 tablet (325 mg total) by mouth every other day. 07/08/22 07/08/23 Yes [name], MD  fexofenadine (ALLEGRA) 180 MG tablet Take 1 tablet (180 mg total) by mouth daily. 05/10/22 11/16/22 Yes [name], PA-C  Fluticasone-Umeclidin-Vilant (TRELEGY ELLIPTA) 100-62.5-25 MCG/ACT AEPB INHALE 1 PUFF ONCE DAILY Strength: 100-62.5-25 MCG/ACT Patient taking differently: Inhale 1 puff into the lungs 2 (two) times daily. 02/10/22  Yes Lacinda Axon, MD  furosemide (LASIX) 40 MG tablet Take 1 tablet (40 mg total) by mouth as needed for fluid or edema. Patient taking differently: Take 40 mg by mouth every other day. 08/19/22  Yes Atway, Rayann N, DO  gabapentin (NEURONTIN) 300 MG capsule TAKE 2 CAPSULES BY MOUTH IN THE MORNING AND TAKE 3 CAPSULES AT BEDTIME Patient taking differently: Take 300 mg by mouth See admin instructions. Take 629m by mouth in the morning and take 9051mby mouth  at bedtime. 11/07/22  Yes [name], MD  LANTUS SOLOSTAR 100 UNIT/ML Solostar Pen Inject 30 Units into the skin at bedtime. 09/30/22  Yes [provider]  losartan-hydrochlorothiazide (HYZAAR) 100-25 MG tablet Take 1 tablet by mouth daily. 11/17/21 11/17/22 Yes [name], MD  metFORMIN (GLUCOPHAGE-XR) 500 MG 24 hr tablet Take 1 tablet (500 mg total) by mouth 2 (two) times daily.  09/23/22  Yes [name], MD  methocarbamol (ROBAXIN) 500 MG tablet Take 1 tablet (500 mg total) by mouth every 6 (six) hours as needed for muscle spasms. 03/07/22  Yes [name], MD  metoprolol succinate (TOPROL XL) 50 MG 24 hr tablet Take 1 tablet (50 mg total) by mouth daily. Take with or immediately following a meal. 11/17/21 11/17/22 Yes [name], MD  montelukast (SINGULAIR) 10 MG tablet Take 1 tablet (10 mg total) by mouth at bedtime. 08/06/22 05/03/23 Yes [name], MD  senna-docusate (SENNA-TIME S) 8.6-50 MG tablet Take 1 tablet by mouth at bedtime as needed for mild constipation. 07/08/22  Yes [name], MD  traMADol (ULTRAM) 50 MG tablet Take 1 tablet (50 mg total) by mouth every 6 (six) hours as needed. Patient taking differently: Take 50 mg by mouth every 6 (six) hours as needed for moderate pain or severe pain. 07/01/22  Yes Hazel Sams, PA-C  traZODone (DESYREL) 50 MG tablet Take 1-2 tablets (50-100 mg total) by mouth at bedtime. 11/06/22  Yes Eulis Canner E, NP  VENTOLIN HFA 108 (90 Base) MCG/ACT inhaler INHALE 2 PUFFS BY MOUTH EVERY 6 HOURS AS NEEDED FOR WHEEZING FOR SHORTNESS OF BREATH Patient taking differently: Inhale 2 puffs into the lungs every 6 (six) hours as needed for wheezing or shortness of breath. 09/19/22  Yes [name], MD  Accu-Chek FastClix Lancets MISC Check blood sugar two times a day 03/17/19   Lorella Nimrod, MD  ASPIRIN ADULT LOW STRENGTH 81 MG EC tablet TAKE 1 Tablet BY MOUTH ONCE DAILY Patient not taking: Reported on 11/16/2022 10/11/18   Lorella Nimrod, MD  B-D UF III MINI PEN NEEDLES 31G X 5 MM MISC USE AS DIRECTED DAILY 09/10/22   Angelica Pou, MD  Blood Glucose Monitoring Suppl (ACCU-CHEK GUIDE) w/Device KIT 1 each by Does not apply route 2 (two) times daily. 03/17/19   Lorella Nimrod, MD  Continuous Blood Gluc Sensor (FREESTYLE LIBRE 3 SENSOR) MISC Place 1 sensor on the skin every 14 days. Use to check glucose  continuously 06/26/22   [name], MD  fluticasone [facility]) 50 MCG/ACT nasal spray Place 1 spray into both nostrils daily. Begin by using 2 sprays in each nare daily for 3 to 5 days, then decrease to 1 spray in each nare daily. Patient not taking: Reported on 11/16/2022 05/10/22   [name], PA-C  glucose blood (ACCU-CHEK GUIDE) test strip Check blood sugar 2 times per day 08/21/21   [name], MD  insulin glargine-yfgn (SEMGLEE) 100 UNIT/ML Pen Inject 30 Units into the skin at bedtime. Patient not taking: Reported on 11/16/2022 06/27/22   [name], MD  Fluticasone-Salmeterol (ADVAIR DISKUS) 100-50 MCG/DOSE AEPB Inhale 1 puff into the lungs daily. 02/20/20 05/09/20  Earlene Plater, MD      Allergies    Patient has no known allergies.    Review of Systems   Review of Systems Ten systems reviewed and are negative for acute change, except as noted in the HPI.   Physical Exam Updated Vital Signs BP (!) 124/58   Pulse  93   Temp 99.2 F (37.3 C) (Oral)   Resp 18   Ht 5' 2.99" (1.6 m)   Wt (!) 137 kg   LMP 02/03/2012   SpO2 100%   BMI 53.51 kg/m  Physical Exam Vitals and nursing note reviewed.  Constitutional:      General: She is not in acute distress.    Appearance: She is well-developed.  HENT:     Head: Normocephalic and atraumatic.  Eyes:     Conjunctiva/sclera: Conjunctivae normal.  Cardiovascular:     Rate and Rhythm: Normal rate and regular rhythm.     Heart sounds: No murmur heard. Pulmonary:     Breath sounds: Wheezing present.     Comments: Tachypneic, with audible wheezing Abdominal:     Palpations: Abdomen is soft.     Tenderness: There is abdominal tenderness in the right lower quadrant. There is right CVA tenderness.  Musculoskeletal:        General: No swelling.     Cervical back: Neck supple.  Skin:    General: Skin is warm and dry.     Capillary Refill: Capillary refill takes less than 2 seconds.  Neurological:      Mental Status: She is alert.  Psychiatric:        Mood and Affect: Mood normal.     ED Results / Procedures / Treatments   Labs (all labs ordered are listed, but only abnormal results are displayed) Labs Reviewed  LACTIC ACID, PLASMA - Abnormal; Notable for the following components:      Result Value   Lactic Acid, Venous 2.7 (*)    All other components within normal limits  COMPREHENSIVE METABOLIC PANEL - Abnormal; Notable for the following components:   Chloride 97 (*)    Glucose, Bld 214 (*)    Creatinine, Ser 1.06 (*)    Total Bilirubin 1.4 (*)    GFR, Estimated 59 (*)    Anion gap 16 (*)    All other components within normal limits  CBC WITH DIFFERENTIAL/PLATELET - Abnormal; Notable for the following components:   Neutro Abs 9.4 (*)    Lymphs Abs 0.5 (*)    All other components within normal limits  URINALYSIS, ROUTINE W REFLEX MICROSCOPIC - Abnormal; Notable for the following components:   APPearance HAZY (*)    Hgb urine dipstick MODERATE (*)    Nitrite POSITIVE (*)    Leukocytes,Ua SMALL (*)    Bacteria, UA RARE (*)    All other components within normal limits  GLUCOSE, CAPILLARY - Abnormal; Notable for the following components:   Glucose-Capillary 195 (*)    All other components within normal limits  I-STAT CHEM 8, ED - Abnormal; Notable for the following components:   Glucose, Bld 219 (*)    Calcium, Ion 1.14 (*)    All other components within normal limits  CULTURE, BLOOD (ROUTINE X 2)  CULTURE, BLOOD (ROUTINE X 2)  URINE CULTURE  LACTIC ACID, PLASMA  PROTIME-INR  APTT  TROPONIN I (HIGH SENSITIVITY)  TROPONIN I (HIGH SENSITIVITY)    EKG EKG Interpretation  Date/Time:  Sunday November 16 2022 09:04:12 EST Ventricular Rate:  104 PR Interval:  214 QRS Duration: 91 QT Interval:  332 QTC Calculation: 437 R Axis:   51 Text Interpretation: Sinus tachycardia Borderline prolonged PR interval Low voltage, precordial leads No significant change since last  tracing Confirmed by Blanchie Dessert [phone]) on 11/16/2022 9:40:36 AM  Radiology CT CHEST ABDOMEN PELVIS W CONTRAST  Result  Date: 11/16/2022 CLINICAL DATA:  Right lower quadrant abdominal pain and shortness of breath EXAM: CT CHEST, ABDOMEN, AND PELVIS WITH CONTRAST TECHNIQUE: Multidetector CT imaging of the chest, abdomen and pelvis was performed following the standard protocol during bolus administration of intravenous contrast. RADIATION DOSE REDUCTION: This exam was performed according to the departmental dose-optimization program which includes automated exposure control, adjustment of the mA and/or kV according to patient size and/or use of iterative reconstruction technique. CONTRAST:  165m OMNIPAQUE IOHEXOL 350 MG/ML SOLN COMPARISON:  Chest radiograph dated 11/16/2022, CT abdomen and pelvis dated 01/12/2018, CT chest dated 09/01/2013 FINDINGS: CT CHEST FINDINGS Cardiovascular: Left atrial enlargement. Aortic valve replacement. Caseous mitral annular calcification. Coronary artery calcifications. No significant pericardial fluid/thickening. Aortic atherosclerosis. Great vessels are normal in course and caliber. No central pulmonary emboli. Mediastinum/Nodes: Partially imaged thyroid gland without nodules meeting criteria for imaging follow-up by size. Normal esophagus. No pathologically enlarged axillary, supraclavicular, mediastinal, or hilar lymph nodes. Lungs/Pleura: The central airways are patent. Lingular, middle lobe, and bilateral lower lobe linear atelectasis/scarring. Subtle mosaic attenuation. No pneumothorax. No pleural effusion. Musculoskeletal: No acute or abnormal lytic or blastic osseous lesions. Multilevel degenerative changes of the thoracic spine. Partially imaged left shoulder arthroplasty appears intact. CT ABDOMEN PELVIS FINDINGS Hepatobiliary: No focal hepatic lesions. No intra or extrahepatic biliary ductal dilation. Cholecystectomy. Pancreas: No focal lesions or main ductal  dilation. Spleen: Normal in size without focal abnormality. Adrenals/Urinary Tract: No adrenal nodules. 16 mm nonenhancing left upper pole mildly complicated cyst is slightly increased in size from 10 mm on 09/01/2013. No suspicious renal masses. Additional bilateral subcentimeter hypodensities, too small to characterize. No follow-up imaging recommended. Right hydronephrosis to the level of an obstructing right ureteropelvic junction stone measuring 4 mm. Moderate perinephric stranding. No left hydronephrosis. Punctate nonobstructing left nephrolithiasis. No focal bladder wall thickening. Stomach/Bowel: Normal appearance of the stomach. No evidence of bowel wall thickening, distention, or inflammatory changes. Normal appendix. Vascular/Lymphatic: Aortic atherosclerosis. 13 mm left para-aortic lymph node (3:57) unchanged dating back to 09/01/2013. Reproductive: No adnexal masses. Other: No free fluid, fluid collection, or free air. Musculoskeletal: Postsurgical changes from L4-5 spinal fusion. Hardware appears intact. Multilevel degenerative changes of the lumbar spine. Anterior abdominal mesh hernia repair. Moderate-sized fat-containing ventral midline abdominal hernias along the superior margin of the hernia mesh repair and two additional fat-containing midline abdominal hernias inferior to the mesh repair, 1 small, 1 moderate. IMPRESSION: 1. Obstructing 4 mm right ureteropelvic junction stone with associated mild right hydronephrosis. 2. Punctate nonobstructing left nephrolithiasis. No left hydronephrosis. 3. Subtle mosaic attenuation of the bilateral lungs, likely due to air trapping, which could reflect small airways disease. No pulmonary edema or pleural effusion. 4. Anterior abdominal mesh hernia repair with moderate-sized fat-containing ventral midline abdominal hernias along the superior margin of the hernia mesh repair and two additional fat-containing midline abdominal hernias inferior to the mesh repair,  1 small, 1 moderate. 5. Coronary artery calcifications. Aortic Atherosclerosis (ICD10-I70.0). Electronically Signed   By: LDarrin NipperM.D.   On: 11/16/2022 11:35   DG Chest Port 1 View  Result Date: 11/16/2022 CLINICAL DATA:  Right-sided abdominal pain and right flank pain EXAM: PORTABLE CHEST 1 VIEW COMPARISON:  Chest radiograph dated 11/28/2020 FINDINGS: Low lung volumes. Asymmetric hazy right lung opacities, slightly more confluent in the right lung base. Left basilar linear opacities. Questionable layering right pleural effusion. No pneumothorax. status post aortic valve replacement. Partially imaged left shoulder arthroplasty. IMPRESSION: 1. Asymmetric hazy right lung opacities, slightly more confluent  in the right lung base. Findings may reflect asymmetric edema or infection. 2. Questionable layering right pleural effusion. Electronically Signed   By: Darrin Nipper M.D.   On: 11/16/2022 09:33    Procedures Procedures    Medications Ordered in ED Medications  lactated ringers infusion ( Intravenous New Bag/Given 11/16/22 1359)  insulin aspart (novoLOG) injection 0-14 Units (4 Units Subcutaneous Given 11/16/22 1359)  chlorhexidine (PERIDEX) 0.12 % solution (has no administration in time range)  diphenhydrAMINE (BENADRYL) 50 MG/ML injection (has no administration in time range)  HYDROmorphone (DILAUDID) injection 1 mg (1 mg Intravenous Given 11/16/22 1033)  ipratropium-albuterol (DUONEB) 0.5-2.5 (3) MG/3ML nebulizer solution 3 mL (3 mLs Nebulization Given 11/16/22 1025)  lactated ringers bolus 500 mL (0 mLs Intravenous Stopped 11/16/22 1225)  cefTRIAXone (ROCEPHIN) 1 g in sodium chloride 0.9 % 100 mL IVPB (0 g Intravenous Stopped 11/16/22 1148)  iohexol (OMNIPAQUE) 350 MG/ML injection 100 mL (100 mLs Intravenous Contrast Given 11/16/22 1115)  chlorhexidine (PERIDEX) 0.12 % solution 15 mL (15 mLs Mouth/Throat Given 11/16/22 1359)    Or  Oral care mouth rinse ( Mouth Rinse See Alternative 11/16/22  1359)    ED Course/ Medical Decision Making/ A&P                           Medical Decision Making Amount and/or Complexity of Data Reviewed Radiology: ordered.  Risk Prescription drug management.   This patient complains of abdominal pain, this involves an extensive number of treatment options, and is a complaint that carries with it a high risk of complications and morbidity.  The differential diagnosis includes appendicitis, renal stones, cystitis/pyelonephritis, diverticulitis, small bowel obstruction, GI bleed perforation  I Ordered, reviewed, and interpreted labs, which included  CBC without leukocytosis CMP with a mildly elevated creatinine 1.06, slightly elevated anion gap of 16, CO2 normal UA with positive nitrites, 21-50 WBCs, small leukocytes  I ordered medication DuoNeb, Dilaudid for pain, Rocephin for UTI, 500 LR bolus I ordered imaging studies which included chest x-ray and CT of the chest abdomen and pelvis  I independently visualized and interpreted imaging which showed Chest xray w/  Asymmetric hazy right lung opacities, slightly more confluent in the right lung base. Findings may reflect asymmetric edema or infection. 2. Questionable layering right pleural effusion.  CT CAP IMPRESSION: 1. Obstructing 4 mm right ureteropelvic junction stone with associated mild right hydronephrosis. 2. Punctate nonobstructing left nephrolithiasis. No left hydronephrosis. 3. Subtle mosaic attenuation of the bilateral lungs, likely due to air trapping, which could reflect small airways disease. No pulmonary edema or pleural effusion. 4. Anterior abdominal mesh hernia repair with moderate-sized fat-containing ventral midline abdominal hernias along the superior margin of the hernia mesh repair and two additional fat-containing midline abdominal hernias inferior to the mesh repair, 1 small, 1 moderate. 5. Coronary artery calcifications. Aortic Atherosclerosis   I consulted  urology and discussed lab and imaging findings who plan to take the patient to the OR for stent placement  After the interventions stated above, I reevaluated the patient and found patient benefit from admission for infected renal stone and COPD exacerbation. Spoke with internal medicine teaching service who will admit the patient for further evaluation and treatment   Final Clinical Impression(s) / ED Diagnoses Final diagnoses:  Urinary tract obstruction by kidney stone  COPD exacerbation ([place])    Rx / DC Orders ED Discharge Orders     None  Garald Balding, PA-C 11/16/22 1416    Blanchie Dessert, MD 11/18/22 0004

## 2022-11-16 NOTE — Anesthesia Procedure Notes (Signed)
Procedure Name: Intubation Date/Time: 11/16/2022 2:18 PM  Performed by: Eligha Bridegroom, CRNAPre-anesthesia Checklist: Patient identified, Emergency Drugs available, Patient being monitored, Suction available and Timeout performed Patient Re-evaluated:Patient Re-evaluated prior to induction Oxygen Delivery Method: Circle system utilized Preoxygenation: Pre-oxygenation with 100% oxygen Induction Type: IV induction, Rapid sequence and Cricoid Pressure applied Laryngoscope Size: Glidescope Grade View: Grade I Tube type: Oral Tube size: 7.0 mm Number of attempts: 1 Airway Equipment and Method: Stylet and Video-laryngoscopy Secured at: 21 cm Tube secured with: Tape Dental Injury: Teeth and Oropharynx as per pre-operative assessment

## 2022-11-16 NOTE — Op Note (Signed)
Operative Note  Preoperative diagnosis:  1.  Right proximal ureteral stone 2. UTI  Postoperative diagnosis: 1.  same  Procedure(s): 1.  Cystoscopy 2. right retrograde pyelogram with interpretation 3. right ureteral stent placement 6x24 cm 4. Fluoroscopy <1 hour with intraoperative interpretation  Surgeon: Donald Pore, MD  Assistants:  None  Anesthesia:  General  Complications:  None  EBL:  minimal  Specimens: 1. none  Drains/Catheters: 1.  Right 6Fr x 24cm ureteral stent  Intraoperative findings:   Cystoscopy demonstrated no suspicious lesions, masses, stones or other pathology. Retrograde pyelogram demonstrated hydronephrosis. Successful right ureteral stent placement with curl in the renal pelvis and bladder respectively. There was return of murky fluid from the collecting system  Indication:  [name] is a 61 y.o. female with the above. After reviewing the management options for treatment, she elected to proceed with the above surgical procedure(s). We have discussed the potential benefits and risks of the procedure, side effects of the proposed treatment, the likelihood of the patient achieving the goals of the procedure, and any potential problems that might occur during the procedure or recuperation. Informed consent has been obtained.  Description of procedure: The patient was taken to the operating room and general anesthesia was induced.  The patient was placed in the dorsal lithotomy position, prepped and draped in the usual sterile fashion, and preoperative antibiotics were administered. A preoperative time-out was performed.   Cystourethroscopy was performed.  The patient's urethra was examined and was normal. The bladder was then systematically examined in its entirety. 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 There was no evidence for any bladder tumors, stones, or other mucosal pathology.    Attention then turned to the right ureteral orifice. A  0.038 zip wire was passed through the right orifice and over the wire a 5 Fr open ended catheter was inserted and passed up to the level of the renal pelvis. Omnipaque contrast was injected through the ureteral catheter and a retrograde pyelogram was performed with findings as dictated above. The wire was then replaced and the open ended catheter was removed.   A 6Fr x 24cm ureteral stent was advance over the wire. The stent was positioned appropriately under fluoroscopic and cystoscopic guidance.  The wire was then removed with an adequate stent curl noted in the renal pelvis as well as in the bladder.  The bladder was then emptied and the procedure ended.  The patient appeared to tolerate the procedure well and without complications.  The patient was able to be awakened and transferred to the recovery unit in satisfactory condition.   Plan:  --f/u cultures --will arrange for ureteroscopy as outpatient --please call or reconsult with any further questions   Daryll Brod MD [facility]  Pager: [phone]

## 2022-11-16 NOTE — ED Provider Triage Note (Signed)
Emergency Medicine Provider Triage Evaluation Note  JAMA MCMILLER , a 61 y.o. female  was evaluated in triage.  Pt complains of right sided abdominal pain and right flank pain since around 6 AM this morning.  Pain woke her up out of bed.  Also with nausea and vomiting.  Pain described as constant, waxing and waning, and sharp.  Hx of kidney stones, states this feels similar yet somehow different.  Also with shortness of breath.  Hx of COPD, CAD, MI, HTN, DMT2, CHF, and prior PE.  Used to be on oxygen therapy at night with her CPAP machine, however has not needed this as of the last few months.  Was placed on 2 L nasal cannula satting at 90% on room air by EMS.  However in the triage room, saturation was decreasing, was up to 6 L/min.  Actively vomiting and endorses shortness of breath.  AAOx4.  Denies urinary symptoms, changes in bowel habits, fever, or new injury.  Hx of cholecystectomy, not new.  Review of Systems  Positive:  Negative: See above  Physical Exam  BP (!) 144/70 (BP Location: Right Arm)   Pulse (!) 102   Temp 98.1 F (36.7 C) (Oral)   Resp (!) 22   Ht '5\' 3"'$  (1.6 m)   Wt (!) 137 kg   LMP 02/03/2012   SpO2 (!) 87%   BMI 53.50 kg/m  Gen:   Awake, no distress, ill-appearing Resp:  Normal effort, tachypneic, shallow breaths MSK:   Moves extremities without difficulty Other:  Sitting in wheelchair, hunched over vomiting into bag.  Right-sided abdominal tenderness, protuberant.  Also with mild right flank tenderness.  Not diaphoretic.  Medical Decision Making  Medically screening exam initiated at 8:39 AM.  Appropriate orders placed.  RETA NORGREN was informed that the remainder of the evaluation will be completed by another provider, this initial triage assessment does not replace that evaluation, and the importance of remaining in the ED until their evaluation is complete.  Patient meets SIRS and possible sepsis criteria.  Work-up initiated.  Discussed patient with  triage nurse, plan to bring back to next available room soon as possible.   Prince Rome, PA-C 17/49/44 [phone]

## 2022-11-16 NOTE — Hospital Course (Addendum)
Infected stone, in OR Came in wheezing and hypoxemic, on 2L Giving tx duoneb  101.1F 127/57 SpO2 95% 2.5 L HR 101  Having some dysuria PTA.Unsure when it started No inc in cough - cough has been bad for a while (clear sputum) Has chest pain when you cough  No abd pain - felt like a knife in her abd  Needing more O2 now   Usually can get to bathroom but sometimes is incontinent, wears depends Does not have scooter at home Tries to stay off O2 at home Now on 2.5L   11/17/22 Patient is feeling better. She states that she is having some hip soreness but otherwise is doing much better. Patient reports that her breathing is better. Patient notes that she does not use her oxygen a lot. Patient denies any nausea or vomiting. She does report some wheezing and does report that she is back to her baseline.   11/18/22 Pt is a little upset that she is not able to go home today but she understands. She does state that her oxygen is giving her some headaches and she took it off. Patient reports she has some abdominal pain. She reports that he breathing is heavy.

## 2022-11-16 NOTE — Anesthesia Postprocedure Evaluation (Signed)
Anesthesia Post Note  Patient: [name]  Procedure(s) Performed: CYSTOSCOPY WITH RETROGRADE PYELOGRAM/URETERAL STENT PLACEMENT (Right: Ureter)     Patient location during evaluation: PACU Anesthesia Type: General Level of consciousness: sedated and patient cooperative Pain management: pain level controlled Vital Signs Assessment: post-procedure vital signs reviewed and stable Respiratory status: spontaneous breathing Cardiovascular status: stable Anesthetic complications: no   No notable events documented.  Last Vitals:  Vitals:   11/16/22 1715 11/16/22 1800  BP: (!) 123/53 112/60  Pulse: 87 96  Resp:    Temp:  (!) 38.3 C  SpO2: 97% 96%    Last Pain:  Vitals:   11/16/22 1800  TempSrc: Axillary  PainSc:                  Nolon Nations

## 2022-11-16 NOTE — Discharge Instructions (Signed)
Dear [name],  It was a pleasure taking care of you! You were admitted to the hospital with a bacterial infection in your blood and your urine as well as an exacerbation of your COPD and were started on antibiotics and steroids.   You were started on an antibiotic called cephadroxil, please take 2 pills twice a day, starting Wednesday evening (the day you go home) and your last dose will be the evening of December 2nd.  You will take your last steroid pills (prednisone) tomorrow.   Please go to your follow-up appointment with Dr. Sanjuana Mae on 11/26/22 at 3:45 pm at the internal medicine clinic. You also have a follow-up appointment with Dr. Johnnye Sima at on 12/03/22 at ...  Please continue to take all of your other medications and inhalers as prescribed. Please find information below about care post-ureteroscopy.  Take care!     [facility] [phone] Post Ureteroscopy With or Without Stent Instructions  Definitions:  Ureter: The duct that transports urine from the kidney to the bladder. Stent:   A plastic hollow tube that is placed into the ureter, from the kidney to the bladder to prevent the ureter from swelling shut.  GENERAL INSTRUCTIONS:  Despite the fact that no skin incisions were used, the area around the ureter and bladder is raw and irritated. The stent is a foreign body which will further irritate the bladder wall. This irritation is manifested by increased frequency of urination, both day and night, and by an increase in the urge to urinate. In some, the urge to urinate is present almost always. Sometimes the urge is strong enough that you may not be able to stop yourself from urinating. The only real cure is to remove the stent and then give time for the bladder wall to heal which can't be done until the danger of the ureter swelling shut has passed, which varies.  You may see some blood in your urine while the stent is in place and a few days  afterwards. Do not be alarmed, even if the urine was clear for a while. Get off your feet and drink lots of fluids until clearing occurs. If you start to pass clots or don't improve, call us.  DIET: You may return to your normal diet immediately. Because of the raw surface of your bladder, alcohol, spicy foods, acid type foods and drinks with caffeine may cause irritation or frequency and should be used in moderation. To keep your urine flowing freely and to avoid constipation, drink plenty of fluids during the day ( 8-10 glasses ). Tip: Avoid cranberry juice because it is very acidic.  ACTIVITY: Your physical activity doesn't need to be restricted. However, if you are very active, you may see some blood in your urine. We suggest that you reduce your activity under these circumstances until the bleeding has stopped.  BOWELS: It is important to keep your bowels regular during the postoperative period. Straining with bowel movements can cause bleeding. A bowel movement every other day is reasonable. Use a mild laxative if needed, such as Milk of Magnesia 2-3 tablespoons, or 2 Dulcolax tablets. Call if you continue to have problems. If you have been taking narcotics for pain, before, during or after your surgery, you may be constipated. Take a laxative if necessary.   MEDICATION: You should resume your pre-surgery medications unless told not to. In addition you will often be given an antibiotic to prevent infection and likely several as needed medications for stent related  discomfort. These should be taken as prescribed until the bottles are finished unless you are having an unusual reaction to one of the drugs.  PROBLEMS YOU SHOULD REPORT TO [place]: Fevers over 100.5 Fahrenheit. Heavy bleeding, or clots ( See above notes about blood in urine ). Inability to urinate. Drug reactions ( hives, rash, nausea, vomiting, diarrhea ). Severe burning or pain with urination that is not improving.

## 2022-11-16 NOTE — Consult Note (Signed)
Urology Consult   Reason for consult: right ureteral stone in the setting of UTI  History of Present Illness: [name] is a 61 y.o. who presented to the ED c/o flank pain beginning this AM. CT scan obtained - it shows a 6-7 mm stone at the right UPJ with associated hydronephrosis. UA is positive for LE and nitrite.  Patient with significant medical comorbidities including COOPD, CAD, HTN, DM2, CHF, hx of PE. She has had stones before that required intervention.   Past Medical History:  Diagnosis Date   Anemia    Anxiety    Aortic valve stenosis, severe    Arthritis    PAIN AND SEVERE OA LEFT KNEE ; S/P RIGHT TKA ON 02/03/12; HAS LOWER BACK PAIN-UNABLE TO STAND MORE THAN 10 MIN; ARTHRITIS "ALL OVER"   Asthma    Blood transfusion    2013[facility]   Breast cancer in female [facility])    Right   CAD (coronary artery disease)    Cath 2010 with DES x 1 RCA-- PT'S CARDIOLOGIST IS DR. MCALHANY   Chronic diastolic congestive heart failure (HCC)    COPD (chronic obstructive pulmonary disease) (HCC)    Depression    Diabetes mellitus DIAGNOSED IN2010   Dyspnea    with much ambulation   Eczema    on back   Headache    migraines younger- rare now 02/07/21   Heart murmur    no current problems   History of hiatal hernia    History of kidney stones    passed or blasted   Hyperlipidemia    Hypertension    Morbid obesity with body mass index of 60.0-69.9 in adult [facility])    Myocardial infarction ([place])    PT THINKS SHE WAS DX WITH MI AT THE TIME OF HEART STENTING   Neuromuscular disorder (HCC)    bilateral arm/hands   Oxygen dependent    uses 3L oxygen night/prn   Personal history of radiation therapy    Pneumonia    Pulmonary embolism ([place]) 02/08/2012   S/P RT TOTAL KNEE ON 02/03/12--ON 02/08/12--DEVELOPED ACUTE SOB AND CHEST PAIN--AND DIAGNOSED WITH  PULMONARY EMBOLUS AND PNEUMONIA   Restless leg syndrome    Sleep apnea    uses 3 liters O2 at night as needed   Uterine fibroid    NO  PROBLEMS AT PRESENT FROM THE FIBROIDS-STATES SHE IS POST MENOPAUSAL-LAST MENSES 2010 EXCEPT FOR EPISODE THIS YR OF BLEEDING RELATED TO FIBROIDS.   Weakness    BOTH HANDS - S/P BILATERAL CARPAL TUNNEL RELEASE--BUT STILL HAS WEAKNESS--OFTEN DROPS THINGS    Past Surgical History:  Procedure Laterality Date   BACK SURGERY  02/11/2021   Dr Louanne Skye   BREAST BIOPSY Right 06/04/2018   BREAST LUMPECTOMY Right 06/2018   BREAST LUMPECTOMY WITH RADIOACTIVE SEED AND SENTINEL LYMPH NODE BIOPSY Right 07/19/2018   Procedure: BREAST LUMPECTOMY WITH RADIOACTIVE SEED AND SENTINEL LYMPH NODE BIOPSY;  Surgeon: Alphonsa Overall, MD;  Location: [place];  Service: General;  Laterality: Right;   CARDIAC CATHETERIZATION     CARDIAC VALVE REPLACEMENT     2017   CARPAL TUNNEL RELEASE     Bilateral   CHOLECYSTECTOMY     COLONOSCOPY     CORONARY ANGIOPLASTY     2010 has stent in place   CYSTOSCOPY W/ RETROGRADES Right 09/21/2013   Procedure: CYSTOSCOPY WITH RIGHT RETROGRADE PYELOGRAM RIGHT DOUBLE J STENT ;  Surgeon: Fredricka Bonine, MD;  Location: [facility];  Service: Urology;  Laterality: Right;   CYSTOSCOPY WITH URETEROSCOPY AND STENT PLACEMENT Right 10/25/2013   Procedure: CYSTOSCOPY RIGHT URETEROSCOPY HOLMIUM LASER LITHO AND STENT PLACEMENT;  Surgeon: Fredricka Bonine, MD;  Location: [facility];  Service: Urology;  Laterality: Right;   HERNIA REPAIR     INTRAOPERATIVE TRANSESOPHAGEAL ECHOCARDIOGRAM N/A 12/12/2014   Procedure: INTRAOPERATIVE TRANSESOPHAGEAL ECHOCARDIOGRAM;  Surgeon: Burnell Blanks, MD;  Location: [place];  Service: Cardiovascular;  Laterality: N/A;   JOINT REPLACEMENT     bil total knees   KNEE ARTHROPLASTY  02/03/2012   Procedure: COMPUTER ASSISTED TOTAL KNEE ARTHROPLASTY;  Surgeon: Mcarthur Rossetti, MD;  Location: [place];  Service: Orthopedics;  Laterality: Right;  Right total knee arthroplasty   LEFT AND RIGHT HEART CATHETERIZATION WITH CORONARY ANGIOGRAM N/A 03/17/2013    Procedure: LEFT AND RIGHT HEART CATHETERIZATION WITH CORONARY ANGIOGRAM;  Surgeon: Burnell Blanks, MD;  Location: [facility] CATH LAB;  Service: Cardiovascular;  Laterality: N/A;   LEFT AND RIGHT HEART CATHETERIZATION WITH CORONARY/GRAFT ANGIOGRAM N/A 09/14/2014   Procedure: LEFT AND RIGHT HEART CATHETERIZATION WITH Beatrix Fetters;  Surgeon: Burnell Blanks, MD;  Location: [facility] C[facility] LAB;  Service: Cardiovascular;  Laterality: N/A;   REVERSE SHOULDER ARTHROPLASTY Left 03/06/2022   Procedure: LEFT SHOULDER REPLACEMENT APPLICATION OF WOUND VAC;  Surgeon: [name], MD;  Location: [place];  Service: Orthopedics;  Laterality: Left;   TEE WITHOUT CARDIOVERSION N/A 03/14/2013   Procedure: TRANSESOPHAGEAL ECHOCARDIOGRAM (TEE);  Surgeon: Lelon Perla, MD;  Location[facility] ENDOSCOPY;  Service: Cardiovascular;  Laterality: N/A;   TEE WITHOUT CARDIOVERSION N/A 11/14/2014   Procedure: TRANSESOPHAGEAL ECHOCARDIOGRAM (TEE);  Surgeon: Lelon Perla, MD;  Location: [facility] ENDOSCOPY;  Service: Cardiovascular;  Laterality: N/A;   TONSILLECTOMY     maybe as a child- does not know   TOTAL KNEE ARTHROPLASTY  09/10/2012   Procedure: TOTAL KNEE ARTHROPLASTY;  Surgeon: Mcarthur Rossetti, MD;  Location[facility];  Service: Orthopedics;  Laterality: Left;   TOTAL KNEE REVISION Right 07/15/2013   Procedure: REVISION ARTHROPLASTY RIGHT KNEE;  Surgeon: Mcarthur Rossetti, MD;  Location[facility];  Service: Orthopedics;  Laterality: Right;   TRANSCATHETER AORTIC VALVE REPLACEMENT, TRANSFEMORAL N/A 12/12/2014   Procedure: TRANSCATHETER AORTIC VALVE REPLACEMENT, TRANSFEMORAL;  Surgeon: Burnell Blanks, MD;  Location: [place];  Service: Cardiovascular;  Laterality: N/A;   TRIGGER FINGER RELEASE  09/10/2012   Procedure: RELEASE TRIGGER FINGER/A-1 PULLEY;  Surgeon: Mcarthur Rossetti, MD;  Location: [facility];  Service: Orthopedics;  Laterality: Right;  Right Ring Finger   TUBAL LIGATION        Current Hospital Medications:  Home meds:  No current facility-administered medications on file prior to encounter.   Current Outpatient Medications on File Prior to Encounter  Medication Sig Dispense Refill   Accu-Chek FastClix Lancets MISC Check blood sugar two times a day 102 each 9   albuterol (PROVENTIL) (2.5 MG/3ML) 0.083% nebulizer solution USE 1 VIAL IN NEBULIZER EVERY 4 HOURS AS NEEDED FOR WHEEZING OR SHORTNESS OF BREATH 150 mL 1   amlodipine-atorvastatin (CADUET) 5-80 MG tablet Take 1 tablet by mouth daily. 90 tablet 3   Ascorbic Acid (VITAMIN C) 1000 MG tablet Take 1,000 mg by mouth daily.     ASPIRIN ADULT LOW STRENGTH 81 MG EC tablet TAKE 1 Tablet BY MOUTH ONCE DAILY 90 tablet 3   B-D UF III MINI PEN NEEDLES 31G X 5 MM MISC USE AS DIRECTED DAILY 100 each 5   Blood Glucose Monitoring Suppl (ACCU-CHEK GUIDE)  w/Device KIT 1 each by Does not apply route 2 (two) times daily. 1 kit 1   busPIRone (BUSPAR) 10 MG tablet Take 1 tablet (10 mg total) by mouth 3 (three) times daily. 90 tablet 3   citalopram (CELEXA) 20 MG tablet TAKE 1 TABLET BY MOUTH ONCE DAILY. DO NOT TAKE WHILE TAKING DULOXETINE 30 tablet 3   clopidogrel (PLAVIX) 75 MG tablet TAKE 1 TABLET BY MOUTH IN THE MORNING 90 tablet 3   Continuous Blood Gluc Sensor (FREESTYLE LIBRE 3 SENSOR) MISC Place 1 sensor on the skin every 14 days. Use to check glucose continuously 2 each 11   Dulaglutide (TRULICITY) 3 OI/7.8MV SOPN Inject 3 mg as directed once a week. 2 mL 1   Evolocumab (REPATHA SURECLICK) 672 MG/ML SOAJ INJECT 1 PEN INTO THE SKIN EVERY 14 DAYS 2 mL 11   exemestane (AROMASIN) 25 MG tablet Take 1 tablet (25 mg total) by mouth daily after breakfast. 90 tablet 3   ezetimibe (ZETIA) 10 MG tablet Take 1 tablet (10 mg total) by mouth daily. 90 tablet 3   ferrous sulfate 325 (65 FE) MG tablet Take 1 tablet (325 mg total) by mouth every other day. (Patient not taking: Reported on 11/05/2022) 30 tablet 3   fexofenadine  (ALLEGRA) 180 MG tablet Take 1 tablet (180 mg total) by mouth daily. 90 tablet 1   fluticasone (FLONASE) 50 MCG/ACT nasal spray Place 1 spray into both nostrils daily. Begin by using 2 sprays in each nare daily for 3 to 5 days, then decrease to 1 spray in each nare daily. 32 mL 1   Fluticasone-Umeclidin-Vilant (TRELEGY ELLIPTA) 100-62.5-25 MCG/ACT AEPB INHALE 1 PUFF ONCE DAILY Strength: 100-62.5-25 MCG/ACT 180 each 5   furosemide (LASIX) 40 MG tablet Take 1 tablet (40 mg total) by mouth as needed for fluid or edema. 180 tablet 1   gabapentin (NEURONTIN) 300 MG capsule TAKE 2 CAPSULES BY MOUTH IN THE MORNING AND TAKE 3 CAPSULES AT BEDTIME 150 capsule 0   glucose blood (ACCU-CHEK GUIDE) test strip Check blood sugar 2 times per day 100 each 9   insulin glargine-yfgn (SEMGLEE) 100 UNIT/ML Pen Inject 30 Units into the skin at bedtime. 15 mL 2   losartan-hydrochlorothiazide (HYZAAR) 100-25 MG tablet Take 1 tablet by mouth daily. 90 tablet 3   metFORMIN (GLUCOPHAGE-XR) 500 MG 24 hr tablet Take 1 tablet (500 mg total) by mouth 2 (two) times daily. 90 tablet 2   methocarbamol (ROBAXIN) 500 MG tablet Take 1 tablet (500 mg total) by mouth every 6 (six) hours as needed for muscle spasms. 30 tablet 0   metoprolol succinate (TOPROL XL) 50 MG 24 hr tablet Take 1 tablet (50 mg total) by mouth daily. Take with or immediately following a meal. 90 tablet 3   montelukast (SINGULAIR) 10 MG tablet Take 1 tablet (10 mg total) by mouth at bedtime. 90 tablet 2   Potassium 99 MG TABS Take 99 mg by mouth daily.     senna-docusate (SENNA-TIME S) 8.6-50 MG tablet Take 1 tablet by mouth at bedtime as needed for mild constipation. 30 tablet 1   traMADol (ULTRAM) 50 MG tablet Take 1 tablet (50 mg total) by mouth every 6 (six) hours as needed. 15 tablet 0   traZODone (DESYREL) 50 MG tablet Take 1-2 tablets (50-100 mg total) by mouth at bedtime. 60 tablet 3   VENTOLIN HFA 108 (90 Base) MCG/ACT inhaler INHALE 2 PUFFS BY MOUTH EVERY 6  HOURS AS NEEDED FOR WHEEZING FOR  SHORTNESS OF BREATH 18 g 3   [DISCONTINUED] Fluticasone-Salmeterol (ADVAIR DISKUS) 100-50 MCG/DOSE AEPB Inhale 1 puff into the lungs daily. 360 each 0     Scheduled Meds: Continuous Infusions: PRN Meds:.  Allergies: No Known Allergies  Family History  Problem Relation Age of Onset   Breast cancer Mother        stage IV at diagnosis   Emphysema Mother        smoked   Heart disease Mother    COPD Father        smoked   Asthma Father    Heart disease Father    Cancer Brother        Sinus    Social History:  reports that she quit smoking about 21 years ago. Her smoking use included cigarettes. She has a 45.00 pack-year smoking history. She has never used smokeless tobacco. She reports that she does not currently use alcohol. She reports that she does not currently use drugs after having used the following drugs: Marijuana.  ROS: A complete review of systems was performed.  All systems are negative except for pertinent findings as noted.  Physical Exam:  Vital signs in last 24 hours: Temp:  [98.1 F (36.7 C)-99.2 F (37.3 C)] 99.2 F (37.3 C) (11/19 1154) Pulse Rate:  [95-108] 97 (11/19 1215) Resp:  [14-31] 14 (11/19 1215) BP: (101-151)/(45-82) 117/64 (11/19 1215) SpO2:  [87 %-100 %] 95 % (11/19 1215) Weight:  [185 kg] 137 kg (11/19 0828) Constitutional:  Alert and oriented, No acute distress Cardiovascular: Regular rate and rhythm Respiratory: Normal respiratory effort, Lungs clear bilaterally GI: Abdomen is soft, nontender, nondistended, no abdominal masses GU: No CVA tenderness Neurologic: Grossly intact, no focal deficits Psychiatric: Normal mood and affect  Laboratory Data:  Recent Labs    11/16/22 0841 11/16/22 0916  WBC 10.1  --   HGB 13.3 13.9  HCT 41.5 41.0  PLT 204  --     Recent Labs    11/16/22 0841 11/16/22 0916  NA 139 138  K 3.7 3.7  CL 97* 99  GLUCOSE 214* 219*  BUN 16 17  CALCIUM 9.7  --   CREATININE  1.06* 1.00     Results for orders placed or performed during the hospital encounter of 11/16/22 (from the past 24 hour(s))  Lactic acid, plasma     Status: None   Collection Time: 11/16/22  8:41 AM  Result Value Ref Range   Lactic Acid, Venous 1.8 0.5 - 1.9 mmol/L  Comprehensive metabolic panel     Status: Abnormal   Collection Time: 11/16/22  8:41 AM  Result Value Ref Range   Sodium 139 135 - 145 mmol/L   Potassium 3.7 3.5 - 5.1 mmol/L   Chloride 97 (L) 98 - 111 mmol/L   CO2 26 22 - 32 mmol/L   Glucose, Bld 214 (H) 70 - 99 mg/dL   BUN 16 8 - 23 mg/dL   Creatinine, Ser 1.06 (H) 0.44 - 1.00 mg/dL   Calcium 9.7 8.9 - 10.3 mg/dL   Total Protein 7.0 6.5 - 8.1 g/dL   Albumin 3.7 3.5 - 5.0 g/dL   AST 18 15 - 41 U/L   ALT 13 0 - 44 U/L   Alkaline Phosphatase 71 38 - 126 U/L   Total Bilirubin 1.4 (H) 0.3 - 1.2 mg/dL   GFR, Estimated 59 (L) >60 mL/min   Anion gap 16 (H) 5 - 15  CBC with Differential     Status:  Abnormal   Collection Time: 11/16/22  8:41 AM  Result Value Ref Range   WBC 10.1 4.0 - 10.5 K/uL   RBC 4.79 3.87 - 5.11 MIL/uL   Hemoglobin 13.3 12.0 - 15.0 g/dL   HCT 41.5 36.0 - 46.0 %   MCV 86.6 80.0 - 100.0 fL   MCH 27.8 26.0 - 34.0 pg   MCHC 32.0 30.0 - 36.0 g/dL   RDW 13.8 11.5 - 15.5 %   Platelets 204 150 - 400 K/uL   nRBC 0.0 0.0 - 0.2 %   Neutrophils Relative % 93 %   Neutro Abs 9.4 (H) 1.7 - 7.7 K/uL   Lymphocytes Relative 5 %   Lymphs Abs 0.5 (L) 0.7 - 4.0 K/uL   Monocytes Relative 1 %   Monocytes Absolute 0.1 0.1 - 1.0 K/uL   Eosinophils Relative 0 %   Eosinophils Absolute 0.0 0.0 - 0.5 K/uL   Basophils Relative 0 %   Basophils Absolute 0.0 0.0 - 0.1 K/uL   Immature Granulocytes 1 %   Abs Immature Granulocytes 0.06 0.00 - 0.07 K/uL  Protime-INR     Status: None   Collection Time: 11/16/22  8:41 AM  Result Value Ref Range   Prothrombin Time 12.6 11.4 - 15.2 seconds   INR 1.0 0.8 - 1.2  APTT     Status: None   Collection Time: 11/16/22  8:41 AM   Result Value Ref Range   aPTT 24 24 - 36 seconds  Urinalysis, Routine w reflex microscopic     Status: Abnormal   Collection Time: 11/16/22  8:41 AM  Result Value Ref Range   Color, Urine YELLOW YELLOW   APPearance HAZY (A) CLEAR   Specific Gravity, Urine 1.013 1.005 - 1.030   pH 5.0 5.0 - 8.0   Glucose, UA NEGATIVE NEGATIVE mg/dL   Hgb urine dipstick MODERATE (A) NEGATIVE   Bilirubin Urine NEGATIVE NEGATIVE   Ketones, ur NEGATIVE NEGATIVE mg/dL   Protein, ur NEGATIVE NEGATIVE mg/dL   Nitrite POSITIVE (A) NEGATIVE   Leukocytes,Ua SMALL (A) NEGATIVE   RBC / HPF 6-10 0 - 5 RBC/hpf   WBC, UA 21-50 0 - 5 WBC/hpf   Bacteria, UA RARE (A) NONE SEEN   Squamous Epithelial / LPF 0-5 0 - 5   Mucus PRESENT    Hyaline Casts, UA PRESENT   Lactic acid, plasma     Status: Abnormal   Collection Time: 11/16/22  8:50 AM  Result Value Ref Range   Lactic Acid, Venous 2.7 (HH) 0.5 - 1.9 mmol/L  I-Stat Chem 8, ED     Status: Abnormal   Collection Time: 11/16/22  9:16 AM  Result Value Ref Range   Sodium 138 135 - 145 mmol/L   Potassium 3.7 3.5 - 5.1 mmol/L   Chloride 99 98 - 111 mmol/L   BUN 17 8 - 23 mg/dL   Creatinine, Ser 1.00 0.44 - 1.00 mg/dL   Glucose, Bld 219 (H) 70 - 99 mg/dL   Calcium, Ion 1.14 (L) 1.15 - 1.40 mmol/L   TCO2 29 22 - 32 mmol/L   Hemoglobin 13.9 12.0 - 15.0 g/dL   HCT 41.0 36.0 - 46.0 %  Troponin I (High Sensitivity)     Status: None   Collection Time: 11/16/22  9:30 AM  Result Value Ref Range   Troponin I (High Sensitivity) 5 <18 ng/L   *Note: Due to a large number of results and/or encounters for the requested time period, some results have  not been displayed. A complete set of results can be found in Results Review.   No results found for this or any previous visit (from the past 240 hour(s)).  Renal Function: Recent Labs    11/16/22 0841 11/16/22 0916  CREATININE 1.06* 1.00   Estimated Creatinine Clearance: 78.4 mL/min (by C-G formula based on SCr of 1  mg/dL).  Radiologic Imaging: CT CHEST ABDOMEN PELVIS W CONTRAST  Result Date: 11/16/2022 CLINICAL DATA:  Right lower quadrant abdominal pain and shortness of breath EXAM: CT CHEST, ABDOMEN, AND PELVIS WITH CONTRAST TECHNIQUE: Multidetector CT imaging of the chest, abdomen and pelvis was performed following the standard protocol during bolus administration of intravenous contrast. RADIATION DOSE REDUCTION: This exam was performed according to the departmental dose-optimization program which includes automated exposure control, adjustment of the mA and/or kV according to patient size and/or use of iterative reconstruction technique. CONTRAST:  146m OMNIPAQUE IOHEXOL 350 MG/ML SOLN COMPARISON:  Chest radiograph dated 11/16/2022, CT abdomen and pelvis dated 01/12/2018, CT chest dated 09/01/2013 FINDINGS: CT CHEST FINDINGS Cardiovascular: Left atrial enlargement. Aortic valve replacement. Caseous mitral annular calcification. Coronary artery calcifications. No significant pericardial fluid/thickening. Aortic atherosclerosis. Great vessels are normal in course and caliber. No central pulmonary emboli. Mediastinum/Nodes: Partially imaged thyroid gland without nodules meeting criteria for imaging follow-up by size. Normal esophagus. No pathologically enlarged axillary, supraclavicular, mediastinal, or hilar lymph nodes. Lungs/Pleura: The central airways are patent. Lingular, middle lobe, and bilateral lower lobe linear atelectasis/scarring. Subtle mosaic attenuation. No pneumothorax. No pleural effusion. Musculoskeletal: No acute or abnormal lytic or blastic osseous lesions. Multilevel degenerative changes of the thoracic spine. Partially imaged left shoulder arthroplasty appears intact. CT ABDOMEN PELVIS FINDINGS Hepatobiliary: No focal hepatic lesions. No intra or extrahepatic biliary ductal dilation. Cholecystectomy. Pancreas: No focal lesions or main ductal dilation. Spleen: Normal in size without focal  abnormality. Adrenals/Urinary Tract: No adrenal nodules. 16 mm nonenhancing left upper pole mildly complicated cyst is slightly increased in size from 10 mm on 09/01/2013. No suspicious renal masses. Additional bilateral subcentimeter hypodensities, too small to characterize. No follow-up imaging recommended. Right hydronephrosis to the level of an obstructing right ureteropelvic junction stone measuring 4 mm. Moderate perinephric stranding. No left hydronephrosis. Punctate nonobstructing left nephrolithiasis. No focal bladder wall thickening. Stomach/Bowel: Normal appearance of the stomach. No evidence of bowel wall thickening, distention, or inflammatory changes. Normal appendix. Vascular/Lymphatic: Aortic atherosclerosis. 13 mm left para-aortic lymph node (3:57) unchanged dating back to 09/01/2013. Reproductive: No adnexal masses. Other: No free fluid, fluid collection, or free air. Musculoskeletal: Postsurgical changes from L4-5 spinal fusion. Hardware appears intact. Multilevel degenerative changes of the lumbar spine. Anterior abdominal mesh hernia repair. Moderate-sized fat-containing ventral midline abdominal hernias along the superior margin of the hernia mesh repair and two additional fat-containing midline abdominal hernias inferior to the mesh repair, 1 small, 1 moderate. IMPRESSION: 1. Obstructing 4 mm right ureteropelvic junction stone with associated mild right hydronephrosis. 2. Punctate nonobstructing left nephrolithiasis. No left hydronephrosis. 3. Subtle mosaic attenuation of the bilateral lungs, likely due to air trapping, which could reflect small airways disease. No pulmonary edema or pleural effusion. 4. Anterior abdominal mesh hernia repair with moderate-sized fat-containing ventral midline abdominal hernias along the superior margin of the hernia mesh repair and two additional fat-containing midline abdominal hernias inferior to the mesh repair, 1 small, 1 moderate. 5. Coronary artery  calcifications. Aortic Atherosclerosis (ICD10-I70.0). Electronically Signed   By: LDarrin NipperM.D.   On: 11/16/2022 11:35   DG Chest Port 1  View  Result Date: 11/16/2022 CLINICAL DATA:  Right-sided abdominal pain and right flank pain EXAM: PORTABLE CHEST 1 VIEW COMPARISON:  Chest radiograph dated 11/28/2020 FINDINGS: Low lung volumes. Asymmetric hazy right lung opacities, slightly more confluent in the right lung base. Left basilar linear opacities. Questionable layering right pleural effusion. No pneumothorax. status post aortic valve replacement. Partially imaged left shoulder arthroplasty. IMPRESSION: 1. Asymmetric hazy right lung opacities, slightly more confluent in the right lung base. Findings may reflect asymmetric edema or infection. 2. Questionable layering right pleural effusion. Electronically Signed   By: Darrin Nipper M.D.   On: 11/16/2022 09:33    I independently reviewed the above imaging studies.  Impression/Recommendation: 61 yo F with obstructing stone in the setting of UTI. Recommend stent placement urgently. Procedure and risks reviewed in detail. Will plan for staged ureteroscopy with laser litho thereafter.   Donald Pore 11/16/2022, 12:42 PM  [facility]  Pager: [phone]

## 2022-11-16 NOTE — ED Notes (Signed)
Urology at bedside.

## 2022-11-16 NOTE — Anesthesia Preprocedure Evaluation (Addendum)
Anesthesia Evaluation  Patient identified by MRN, date of birth, ID band Patient awake    Reviewed: Allergy & Precautions, NPO status , Patient's Chart, lab work & pertinent test results, reviewed documented beta blocker date and time   Airway Mallampati: III  TM Distance: >3 FB Neck ROM: Full    Dental  (+) Dental Advisory Given, Edentulous Upper, Edentulous Lower   Pulmonary shortness of breath, asthma , sleep apnea , pneumonia, COPD,  oxygen dependent, former smoker   Pulmonary exam normal breath sounds clear to auscultation       Cardiovascular hypertension, Pt. on home beta blockers and Pt. on medications + CAD, + Past MI, + Cardiac Stents and +CHF  + Valvular Problems/Murmurs  Rhythm:Regular Rate:Normal  Echo 12/2020  1. Left ventricular ejection fraction, by estimation, is 60 to 65%. The left ventricle has normal function. The left ventricle has no regional wall motion abnormalities. There is mild concentric left ventricular hypertrophy. Left ventricular diastolic parameters are indeterminate.   2. Right ventricular systolic function is normal. The right ventricular size is normal.   3. The mitral valve is abnormal. No evidence of mitral valve regurgitation. Mild mitral stenosis. The mean mitral valve gradient is 4.0 mmHg with average heart rate of 77 bpm.   4. The aortic valve has been repaired/replaced. Aortic valve regurgitation is not visualized. 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 Procedure Date: 12/12/2014. Aortic valve mean gradient measures 15.0 mmHg.   5. The inferior vena cava is normal in size with greater than 50% respiratory variability, suggesting right atrial pressure of 3 mmHg.   Comparison(s): A prior study was performed on 01/17/2020. Prior images  reviewed side by side. Compared to prior stable prosthetic valve  gradients; mitral valve mean gradient has decreased at lower  heart rate.     Neuro/Psych  Headaches PSYCHIATRIC DISORDERS Anxiety Depression     Neuromuscular disease    GI/Hepatic Neg liver ROS, hiatal hernia,GERD  ,,  Endo/Other  diabetes    Renal/GU negative Renal ROS     Musculoskeletal  (+) Arthritis ,    Abdominal  (+) + obese  Peds  Hematology  (+) Blood dyscrasia, anemia   Anesthesia Other Findings   Reproductive/Obstetrics                             Anesthesia Physical Anesthesia Plan  ASA: 4 and emergent  Anesthesia Plan: General   Post-op Pain Management: Minimal or no pain anticipated   Induction: Intravenous, Rapid sequence and Cricoid pressure planned  PONV Risk Score and Plan: 4 or greater and Ondansetron, Dexamethasone and Treatment may vary due to age or medical condition  Airway Management Planned: Oral ETT and Video Laryngoscope Planned  Additional Equipment:   Intra-op Plan:   Post-operative Plan: Extubation in OR  Informed Consent: I have reviewed the patients History and Physical, chart, labs and discussed the procedure including the risks, benefits and alternatives for the proposed anesthesia with the patient or authorized representative who has indicated his/her understanding and acceptance.     Dental advisory given  Plan Discussed with: CRNA  Anesthesia Plan Comments:        Anesthesia Quick Evaluation

## 2022-11-16 NOTE — ED Triage Notes (Signed)
PT BIB [facility] from home c/o RLQ abdominal pain that started around 630 am. Pt endorses N/V as well. Pt was placed on 2L [place] for sating at 90% on RA. Pt states she did use to wear oxygen at night.

## 2022-11-16 NOTE — Transfer of Care (Signed)
Immediate Anesthesia Transfer of Care Note  Patient: [name]  Procedure(s) Performed: CYSTOSCOPY WITH RETROGRADE PYELOGRAM/URETERAL STENT PLACEMENT (Right: Ureter)  Patient Location: PACU  Anesthesia Type:General  Level of Consciousness: awake, alert , and oriented  Airway & Oxygen Therapy: Patient Spontanous Breathing and Patient connected to face mask oxygen  Post-op Assessment: Report given to RN and Post -op Vital signs reviewed and stable  Post vital signs: Reviewed and stable  Last Vitals:  Vitals Value Taken Time  BP 154/75 11/16/22 1518  Temp    Pulse 108 11/16/22 1525  Resp 22 11/16/22 1525  SpO2 83 % 11/16/22 1525  Vitals shown include unvalidated device data.  Last Pain:  Vitals:   11/16/22 1155  TempSrc:   PainSc: 1          Complications: No notable events documented.

## 2022-11-16 NOTE — Progress Notes (Signed)
Pacu RN Report to floor given  Gave report to  UTE RN. [id number]    Discussed surgery, meds given in OR and Pacu, VS, IV fluids given, EBL, urine output, pain and other pertinent information. Also discussed if pt had any family or friends here or belongings with them.   Discussed pt is on home O2, had a duoneb here in recovery, very tight. Still dyspneic at exertion. No pain, has already urinated.  Pt exits my care.

## 2022-11-16 NOTE — H&P (Addendum)
Date: 11/16/2022               Patient Name:  [name] MRN: [id number]  DOB: 10-31-1961 Age / Sex: 61 y.o., female   PCP: [name], MD         Medical Service: Internal Medicine Teaching Service         Attending Physician: Dr. Bobby Rumpf MD      First Contact:  Annia Belt, MS3 Pager [phone]     Second Contact: Dr. Leigh Aurora, DO Pager [phone]     Third Contact:   Dr. Brett Canales, MD Pager [phone]     After Hours (After 5p/  First Contact Pager: [phone]  weekends / holidays): Second Contact Pager: [phone]   SUBJECTIVE   Chief Complaint: Abdominal pain, nausea, vomiting, wheezing  History of Present Illness:   Ms. [name] Marando. [name] is a 61 y.o. female with severe aortic stenosis now s/p TAVR 2015, CAD, OSA, HTN, HLD, obesity, GERD, asthma, COPD, former tobacco abuse, depression, s/p knee replacement 2831 complicated by PE treaded with coumadin 6 months, and shoulder replacement 02/2022 presenting with one day of RLQ pain, nausea, and vomiting as well as wheezing and hypoxia.   Patient had abdominal pain in RLQ that started around suddenly 6:30 am, radiated up her flank, felt like a knife in her abdomen and was associated with nausea and vomiting. She endorses dysuria (unspecified duration). She can sometimes make it to the bathroom, but does endorse some urinary leakage and wears depends. Her pain was significantly improved post op.   She has COPD at baseline and has a home O2 requirement of 3L, but does not usually use this, as she and her husband share her O2 tank because hers is more affordable. She was hypoxic and wheezing on arrival. She states her cough was not worsened from her baseline cough, but she is been having chest pain when she coughs and having an increased O2 requirement from baseline. Patient's husband described that she has been coughing more in the past few weeks.  ED Course:  With obstructing stone seen on CT, RLQ pain, CVA  tenderness, n/v and UTI, patient was given dilaudid '1mg'$ , ceftriaxone 1g, and LR bolus 500cc. Urology consulted at this time Patient had wheezing and hypoxia, was given duoneb. Was found to have glucose of 214, given 4U sq novolog. Prior to admission to our service, patient underwent cystoscopy with right retrograde pyelogram and ureteral stent placement.   Meds: patient is able to recognize some of her meds but is not able to name them. At home, she manages her own medications, states that she has confusion sometimes about which medications she is supposed to be taking, sometimes has trouble remembering to take her medications, has trouble picking them up from the pharmacy and does not use the pre-packaged pill packs.    She knows that she takes: albuterol at night when needed, celexa in the morning and afternoon, and buspar at night, trelegy inhaler. Unable to recognize the names of other medications.   Current Meds  Medication Sig   albuterol (PROVENTIL) (2.5 MG/3ML) 0.083% nebulizer solution USE 1 VIAL IN NEBULIZER EVERY 4 HOURS AS NEEDED FOR WHEEZING OR SHORTNESS OF BREATH (Patient taking differently: Take 2.5 mg by nebulization every 4 (four) hours as needed for wheezing or shortness of breath.)   amlodipine-atorvastatin (CADUET) 5-80 MG tablet Take 1 tablet by mouth daily.   busPIRone (BUSPAR) 10 MG tablet Take 1 tablet (10 mg  total) by mouth 3 (three) times daily. (Patient taking differently: Take 10 mg by mouth 2 (two) times daily.)   citalopram (CELEXA) 20 MG tablet TAKE 1 TABLET BY MOUTH ONCE DAILY. DO NOT TAKE WHILE TAKING DULOXETINE (Patient taking differently: Take 20 mg by mouth daily. TAKE 1 TABLET BY MOUTH ONCE DAILY. DO NOT TAKE WHILE TAKING DULOXETINE)   clopidogrel (PLAVIX) 75 MG tablet TAKE 1 TABLET BY MOUTH IN THE MORNING (Patient taking differently: Take 75 mg by mouth daily.)   Dulaglutide (TRULICITY) 3 OM/7.6HM SOPN Inject 3 mg as directed once a week. (Patient taking  differently: Inject 3 mg as directed once a week. Saturdays)   Evolocumab (REPATHA SURECLICK) 094 MG/ML SOAJ INJECT 1 PEN INTO THE SKIN EVERY 14 DAYS (Patient taking differently: Inject 140 mg into the skin every 14 (fourteen) days.)   exemestane (AROMASIN) 25 MG tablet Take 1 tablet (25 mg total) by mouth daily after breakfast.   ezetimibe (ZETIA) 10 MG tablet Take 1 tablet (10 mg total) by mouth daily.   ferrous sulfate 325 (65 FE) MG tablet Take 1 tablet (325 mg total) by mouth every other day.   fexofenadine (ALLEGRA) 180 MG tablet Take 1 tablet (180 mg total) by mouth daily.   Fluticasone-Umeclidin-Vilant (TRELEGY ELLIPTA) 100-62.5-25 MCG/ACT AEPB INHALE 1 PUFF ONCE DAILY Strength: 100-62.5-25 MCG/ACT (Patient taking differently: Inhale 1 puff into the lungs 2 (two) times daily.)   furosemide (LASIX) 40 MG tablet Take 1 tablet (40 mg total) by mouth as needed for fluid or edema. (Patient taking differently: Take 40 mg by mouth every other day.)   gabapentin (NEURONTIN) 300 MG capsule TAKE 2 CAPSULES BY MOUTH IN THE MORNING AND TAKE 3 CAPSULES AT BEDTIME (Patient taking differently: Take 300 mg by mouth See admin instructions. Take '600mg'$  by mouth in the morning and take '900mg'$  by mouth at bedtime.)   LANTUS SOLOSTAR 100 UNIT/ML Solostar Pen Inject 30 Units into the skin at bedtime.   losartan-hydrochlorothiazide (HYZAAR) 100-25 MG tablet Take 1 tablet by mouth daily.   metFORMIN (GLUCOPHAGE-XR) 500 MG 24 hr tablet Take 1 tablet (500 mg total) by mouth 2 (two) times daily.   methocarbamol (ROBAXIN) 500 MG tablet Take 1 tablet (500 mg total) by mouth every 6 (six) hours as needed for muscle spasms.   metoprolol succinate (TOPROL XL) 50 MG 24 hr tablet Take 1 tablet (50 mg total) by mouth daily. Take with or immediately following a meal.   montelukast (SINGULAIR) 10 MG tablet Take 1 tablet (10 mg total) by mouth at bedtime.   senna-docusate (SENNA-TIME S) 8.6-50 MG tablet Take 1 tablet by mouth at  bedtime as needed for mild constipation.   traMADol (ULTRAM) 50 MG tablet Take 1 tablet (50 mg total) by mouth every 6 (six) hours as needed. (Patient taking differently: Take 50 mg by mouth every 6 (six) hours as needed for moderate pain or severe pain.)   traZODone (DESYREL) 50 MG tablet Take 1-2 tablets (50-100 mg total) by mouth at bedtime.   VENTOLIN HFA 108 (90 Base) MCG/ACT inhaler INHALE 2 PUFFS BY MOUTH EVERY 6 HOURS AS NEEDED FOR WHEEZING FOR SHORTNESS OF BREATH (Patient taking differently: Inhale 2 puffs into the lungs every 6 (six) hours as needed for wheezing or shortness of breath.)    Past Medical History  Past Surgical History:  Procedure Laterality Date   BACK SURGERY  02/11/2021   Dr Louanne Skye   BREAST BIOPSY Right 06/04/2018   BREAST LUMPECTOMY Right 06/2018  BREAST LUMPECTOMY WITH RADIOACTIVE SEED AND SENTINEL LYMPH NODE BIOPSY Right 07/19/2018   Procedure: BREAST LUMPECTOMY WITH RADIOACTIVE SEED AND SENTINEL LYMPH NODE BIOPSY;  Surgeon: Alphonsa Overall, MD;  Location: [place];  Service: General;  Laterality: Right;   CARDIAC CATHETERIZATION     CARDIAC VALVE REPLACEMENT     2017   CARPAL TUNNEL RELEASE     Bilateral   CHOLECYSTECTOMY     COLONOSCOPY     CORONARY ANGIOPLASTY     2010 has stent in place   CYSTOSCOPY W/ RETROGRADES Right 09/21/2013   Procedure: CYSTOSCOPY WITH RIGHT RETROGRADE PYELOGRAM RIGHT DOUBLE J STENT ;  Surgeon: Fredricka Bonine, MD;  Location: [facility];  Service: Urology;  Laterality: Right;   CYSTOSCOPY WITH URETEROSCOPY AND STENT PLACEMENT Right 10/25/2013   Procedure: CYSTOSCOPY RIGHT URETEROSCOPY HOLMIUM LASER LITHO AND STENT PLACEMENT;  Surgeon: Fredricka Bonine, MD;  Location: [facility];  Service: Urology;  Laterality: Right;   HERNIA REPAIR     INTRAOPERATIVE TRANSESOPHAGEAL ECHOCARDIOGRAM N/A 12/12/2014   Procedure: INTRAOPERATIVE TRANSESOPHAGEAL ECHOCARDIOGRAM;  Surgeon: Burnell Blanks, MD;  Location: [place];  Service:  Cardiovascular;  Laterality: N/A;   JOINT REPLACEMENT     bil total knees   KNEE ARTHROPLASTY  02/03/2012   Procedure: COMPUTER ASSISTED TOTAL KNEE ARTHROPLASTY;  Surgeon: Mcarthur Rossetti, MD;  Location: [place];  Service: Orthopedics;  Laterality: Right;  Right total knee arthroplasty   LEFT AND RIGHT HEART CATHETERIZATION WITH CORONARY ANGIOGRAM N/A 03/17/2013   Procedure: LEFT AND RIGHT HEART CATHETERIZATION WITH CORONARY ANGIOGRAM;  Surgeon: Burnell Blanks, MD;  Location: [facility] CATH LAB;  Service: Cardiovascular;  Laterality: N/A;   LEFT AND RIGHT HEART CATHETERIZATION WITH CORONARY/GRAFT ANGIOGRAM N/A 09/14/2014   Procedure: LEFT AND RIGHT HEART CATHETERIZATION WITH Beatrix Fetters;  Surgeon: Burnell Blanks, MD;  Location: [facility] CATH LAB;  Service: Cardiovascular;  Laterality: N/A;   REVERSE SHOULDER ARTHROPLASTY Left 03/06/2022   Procedure: LEFT SHOULDER REPLACEMENT APPLICATION OF WOUND VAC;  Surgeon: [name], MD;  Location: [place];  Service: Orthopedics;  Laterality: Left;   TEE WITHOUT CARDIOVERSION N/A 03/14/2013   Procedure: TRANSESOPHAGEAL ECHOCARDIOGRAM (TEE);  Surgeon: Lelon Perla, MD;  Location: [facility] ENDOSCOPY;  Service: Cardiovascular;  Laterality: N/A;   TEE WITHOUT CARDIOVERSION N/A 11/14/2014   Procedure: TRANSESOPHAGEAL ECHOCARDIOGRAM (TEE);  Surgeon: Lelon Perla, MD;  Location: [facility] ENDOSCOPY;  Service: Cardiovascular;  Laterality: N/A;   TONSILLECTOMY     maybe as a child- does not know   TOTAL KNEE ARTHROPLASTY  09/10/2012   Procedure: TOTAL KNEE ARTHROPLASTY;  Surgeon: Mcarthur Rossetti, MD;  Location: [facility];  Service: Orthopedics;  Laterality: Left;   TOTAL KNEE REVISION Right 07/15/2013   Procedure: REVISION ARTHROPLASTY RIGHT KNEE;  Surgeon: Mcarthur Rossetti, MD;  Location: [facility];  Service: Orthopedics;  Laterality: Right;   TRANSCATHETER AORTIC VALVE REPLACEMENT, TRANSFEMORAL N/A 12/12/2014   Procedure: TRANSCATHETER  AORTIC VALVE REPLACEMENT, TRANSFEMORAL;  Surgeon: Burnell Blanks, MD;  Location: [place];  Service: Cardiovascular;  Laterality: N/A;   TRIGGER FINGER RELEASE  09/10/2012   Procedure: RELEASE TRIGGER FINGER/A-1 PULLEY;  Surgeon: Mcarthur Rossetti, MD;  Location: [facility];  Service: Orthopedics;  Laterality: Right;  Right Ring Finger   TUBAL LIGATION      Social:  Lives With: spouse in shared home with two other adults Occupation: retired Support: spouse, appears to be established with mental health care per chart review Level of Function: requires lift chair and  scooter, performs ADL and IADL PCP: [name] MD Substances: 45 pack year history quit 2002  Family History:  - mother with breast cancer, alcohol use disorder, emphysema, diabetes - father with asthma, copd, heart disease, diabetes - brother with cancer (sinus)  Allergies: Allergies as of 11/16/2022   (No Known Allergies)   Review of Systems: A complete ROS was negative except as per HPI.   OBJECTIVE:   Physical Exam: Blood pressure (!) 163/74, pulse (!) 102, temperature 99.1 F (37.3 C), resp. rate 17, height 5' 2.99" (1.6 m), weight (!) 137 kg, last menstrual period 02/03/2012, SpO2 92 %.  Constitutional: chronically ill-appearing female, lying in bed in no acute distress HENT: normocephalic atraumatic, mucous membranes moist Eyes: conjunctiva non-erythematous Neck: supple Cardiovascular: regular rate and rhythm, no m/r/g Pulmonary/Chest: increased work of breathing on 2.5L, diminished breath sounds at lung bases bilaterally Abdominal: deferred 2/2 immediate post-op status MSK: normal bulk and tone Neurological: alert & oriented x 2, confused, stated it was 2005, October, but able to give her husband's phone number.  Skin: Warm and dry Psych: Pleasant and appropriate  Labs:  Lactic acid: 2.7 > 1.8  Urinalysis    Component Value Date/Time   COLORURINE YELLOW 11/16/2022 0841   APPEARANCEUR  HAZY (A) 11/16/2022 0841   APPEARANCEUR Cloudy (A) 06/19/2022 1603   LABSPEC 1.013 11/16/2022 0841   PHURINE 5.0 11/16/2022 0841   GLUCOSEU NEGATIVE 11/16/2022 0841   HGBUR MODERATE (A) 11/16/2022 0841   BILIRUBINUR NEGATIVE 11/16/2022 0841   BILIRUBINUR Negative 06/19/2022 1603   KETONESUR NEGATIVE 11/16/2022 0841   PROTEINUR NEGATIVE 11/16/2022 0841   UROBILINOGEN 1.0 12/08/2014 1303   NITRITE POSITIVE (A) 11/16/2022 0841   LEUKOCYTESUR SMALL (A) 11/16/2022 0841    CBC    Component Value Date/Time   WBC 10.1 11/16/2022 0841   RBC 4.79 11/16/2022 0841   HGB 13.9 11/16/2022 0916   HGB 12.4 09/11/2022 1126   HGB 13.5 10/17/2020 0924   HCT 41.0 11/16/2022 0916   HCT 39.6 10/17/2020 0924   PLT 204 11/16/2022 0841   PLT 211 09/11/2022 1126   PLT 243 10/17/2020 0924   MCV 86.6 11/16/2022 0841   MCV 86 10/17/2020 0924   MCH 27.8 11/16/2022 0841   MCHC 32.0 11/16/2022 0841   RDW 13.8 11/16/2022 0841   RDW 13.3 10/17/2020 0924   LYMPHSABS 0.5 (L) 11/16/2022 0841   MONOABS 0.1 11/16/2022 0841   EOSABS 0.0 11/16/2022 0841   BASOSABS 0.0 11/16/2022 0841     CMP     Component Value Date/Time   NA 138 11/16/2022 0916   NA 138 06/04/2022 1541   K 3.7 11/16/2022 0916   CL 99 11/16/2022 0916   CO2 26 11/16/2022 0841   GLUCOSE 219 (H) 11/16/2022 0916   BUN 17 11/16/2022 0916   BUN 13 06/04/2022 1541   CREATININE 1.00 11/16/2022 0916   CREATININE 0.87 09/11/2022 1126   CREATININE 0.87 07/09/2012 1603   CALCIUM 9.7 11/16/2022 0841   PROT 7.0 11/16/2022 0841   PROT 6.5 10/17/2020 0924   ALBUMIN 3.7 11/16/2022 0841   ALBUMIN 4.2 10/17/2020 0924   AST 18 11/16/2022 0841   AST 12 (L) 09/11/2022 1126   ALT 13 11/16/2022 0841   ALT 9 09/11/2022 1126   ALKPHOS 71 11/16/2022 0841   BILITOT 1.4 (H) 11/16/2022 0841   BILITOT 1.1 09/11/2022 1126   GFRNONAA 59 (L) 11/16/2022 0841   GFRNONAA >60 09/11/2022 1126   GFRAA 81 10/17/2020 0924  GFRAA >60 07/14/2019 1126   Urine  culture- pending Blood culture - pending  Imaging: CT CAP IMPRESSION: 1. Obstructing 4 mm right ureteropelvic junction stone with associated mild right hydronephrosis. 2. Punctate nonobstructing left nephrolithiasis. No left hydronephrosis. 3. Subtle mosaic attenuation of the bilateral lungs, likely due to air trapping, which could reflect small airways disease. No pulmonary edema or pleural effusion. 4. Anterior abdominal mesh hernia repair with moderate-sized fat-containing ventral midline abdominal hernias along the superior margin of the hernia mesh repair and two additional fat-containing midline abdominal hernias inferior to the mesh repair, 1 small, 1 moderate. 5. Coronary artery calcifications. Aortic Atherosclerosis   CHEST XRAY IMPRESSION: 1. Asymmetric hazy right lung opacities, slightly more confluent in the right lung base. Findings may reflect asymmetric edema or infection. 2. Questionable layering right pleural effusion.  CYSTOSCOPY Cystoscopy demonstrated no suspicious lesions, masses, stones or other pathology. Retrograde pyelogram demonstrated hydronephrosis. Successful right ureteral stent placement with curl in the renal pelvis and bladder respectively. There was return of murky fluid from the collecting system  EKG: personally reviewed my interpretation is sinus tachycardia, normal axis, prolonged PR interval. Prior EKG no significant changes.  ASSESSMENT & PLAN:   Assessment & Plan by Problem: Active Problems:   * No active hospital problems. *  HANNIE SHOE is a 61 y.o. person living with a history of aortic stenosis s/p TAVR, T2DM, COPD, asthma, CAD, OSA, HTN, HLD, anxiety/depression and hx of PE who presented with RLQ pain, nausea, vomiting, wheezing, and hypoxia admitted for infected right ureteral stone and COPD exacerbation on hospital day 0  Complicated cystitis Patient presented with RLQ pain, CVA tenderness, nausea, vomiting, fever,  dysuria, CT suspicious for obstructing stone, and UTI on UA. She denied fevers prior to admission and does not have leukocytosis, however post-op she fevered to 101.2. 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 Along with patient endorsement of CVA tenderness and UA with nitrites, leukocytes, and bacteria, suspect complicated cystitis, will continue rocephin with UA pending. 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  - Azithromycin 500 loading dose x 1 today, then '250mg'$  x 4 starting tomorrow - Ceftriaxone 1g q24 starting tomorrow, received 1 dose in ED today - ureteroscopy outpatient per urology - urine culture and blood culture pending - acetaminophen prn for fevers   Acute COPD exacerbation:  Patient was wheezing, tachypneic, and hypoxic on arrival, and received duoneb x 2. With increased cough and fever as well as hypoxia, her presentation is consistent with an acute COPD exacerbation, will treat with antibiotics and steroids. Pneumonia is also on the differential, especially with xray concerning for asymmetric edema v infection with possible right pleural effusion, although CT showed only atelectasis/scarring.  Will trend leukocytes.  - prednisone '40mg'$  x 5 days - Azithromycin 500 loading dose x 1 today, then '250mg'$  x 4 starting tomorrow - Ceftriaxone 1g q24 starting tomorrow, received 1 dose in ED today - Continue home inhalers - AM CBC  Type 2 Diabetes Mellitus Patient's sugars are elevated on admission, will start 1/2 home long acting insulin and SSI, may need to monitor and increase due to patient receiving steroids for COPD exacerbation  - SSI novolog 0-15 U qhs - 1/2 home insulin semglee 15 U qhs - continue gabapentin '600mg'$  am '900mg'$  pm  CAD AS s/p TAVR Patient had drug eluding stent placed in RCA in 2010 and on cath  in 2015, ~ 70%  ostial RCA with patent dRCA stent, medically managed. Appears patient has been on and off plavix, reports not taking aspirin. Will hold plavix in the setting of recent procedure with hematuria post op. Continue plavix here starting tomorrow. - resume plavix tomorrow  Barriers to care Patient is chronically ill with multiple co-morbidities and appears to be the primary care taker for her husband, however, struggles to manage her own medications appears to need increasing levels of mobility assistance at home. Will consult TOC.  - TOC consult   HTN Patient taking amlodipine-atorvastatin 5-'80mg'$ , and losartan-hydrochlorothiazide 100-25, metoprolol succinate '50mg'$ , will continue home meds. - amlodipine-atorvastatin 5-'80mg'$  - losartan-hydrochlorothiazide 100-'25mg'$  - metoprolol succinate '50mg'$   HLD Patient taking zetia and atorvastatin at home, will continue home meds - amlodipine-atorvastatin 5-'80mg'$  - ezetimibe '10mg'$   Anxiety/Depression Patient states that she takes celexa in the morning and afternoon, and buspar at night, which is inconsistent with prior charting, however given patient confusion about which meds she takes in general, will continue  treatment as per last documented regimen - celexa '20mg'$  am starting tomorrow - buspirone '10mg'$  BID starting tonight - trazodone '50mg'$   OSA Patient self-discontinued CPAP several months ago.   Diet: Heart Healthy VTE: SCDs IVF: None,None Code: Full  Prior to Admission Living Arrangement: Home, living with spouse and 2 roommates Anticipated Discharge Location: Home Barriers to Discharge: clinical improvement  Dispo: Admit patient to Observation with expected length of stay less than 2 midnights.  Signed: Annia Belt, Medical Student 11/16/2022, 3:35 PM   Attestation for Student Documentation:  I personally was present and performed or re-performed the history, physical exam and medical decision-making activities of this service and have  verified that the service and findings are accurately documented in the student's note.  Dorethea Clan, DO 11/16/2022, 8:46 PM

## 2022-11-17 ENCOUNTER — Encounter (HOSPITAL_COMMUNITY): Payer: Self-pay | Admitting: Urology

## 2022-11-17 DIAGNOSIS — G9341 Metabolic encephalopathy: Secondary | ICD-10-CM | POA: Diagnosis not present

## 2022-11-17 DIAGNOSIS — A4151 Sepsis due to Escherichia coli [E. coli]: Principal | ICD-10-CM

## 2022-11-17 DIAGNOSIS — N138 Other obstructive and reflux uropathy: Secondary | ICD-10-CM

## 2022-11-17 DIAGNOSIS — N200 Calculus of kidney: Secondary | ICD-10-CM

## 2022-11-17 DIAGNOSIS — J441 Chronic obstructive pulmonary disease with (acute) exacerbation: Secondary | ICD-10-CM | POA: Diagnosis not present

## 2022-11-17 DIAGNOSIS — Z87891 Personal history of nicotine dependence: Secondary | ICD-10-CM | POA: Diagnosis not present

## 2022-11-17 DIAGNOSIS — R6521 Severe sepsis with septic shock: Secondary | ICD-10-CM

## 2022-11-17 LAB — BASIC METABOLIC PANEL
Anion gap: 9 (ref 5–15)
BUN: 19 mg/dL (ref 8–23)
CO2: 30 mmol/L (ref 22–32)
Calcium: 8.8 mg/dL — ABNORMAL LOW (ref 8.9–10.3)
Chloride: 102 mmol/L (ref 98–111)
Creatinine, Ser: 1.12 mg/dL — ABNORMAL HIGH (ref 0.44–1.00)
GFR, Estimated: 55 mL/min — ABNORMAL LOW (ref >60)
Glucose, Bld: 171 mg/dL — ABNORMAL HIGH (ref 70–99)
Potassium: 4 mmol/L (ref 3.5–5.1)
Sodium: 141 mmol/L (ref 135–145)

## 2022-11-17 LAB — CBC
HCT: 35.9 % — ABNORMAL LOW (ref 36.0–46.0)
Hemoglobin: 11.7 g/dL — ABNORMAL LOW (ref 12.0–15.0)
MCH: 28.3 pg (ref 26.0–34.0)
MCHC: 32.6 g/dL (ref 30.0–36.0)
MCV: 86.9 fL (ref 80.0–100.0)
Platelets: 157 10*3/uL (ref 150–400)
RBC: 4.13 MIL/uL (ref 3.87–5.11)
RDW: 14.1 % (ref 11.5–15.5)
WBC: 17.3 10*3/uL — ABNORMAL HIGH (ref 4.0–10.5)
nRBC: 0 % (ref 0.0–0.2)

## 2022-11-17 LAB — GLUCOSE, CAPILLARY
Glucose-Capillary: 153 mg/dL — ABNORMAL HIGH (ref 70–99)
Glucose-Capillary: 164 mg/dL — ABNORMAL HIGH (ref 70–99)
Glucose-Capillary: 174 mg/dL — ABNORMAL HIGH (ref 70–99)
Glucose-Capillary: 193 mg/dL — ABNORMAL HIGH (ref 70–99)
Glucose-Capillary: 214 mg/dL — ABNORMAL HIGH (ref 70–99)

## 2022-11-17 LAB — HIV ANTIBODY (ROUTINE TESTING W REFLEX): HIV Screen 4th Generation wRfx: NONREACTIVE

## 2022-11-17 MED ORDER — UMECLIDINIUM BROMIDE 62.5 MCG/ACT IN AEPB
1.0000 | INHALATION_SPRAY | Freq: Every day | RESPIRATORY_TRACT | Status: DC
  Administered 2022-11-17 – 2022-11-19 (×2): 1 via RESPIRATORY_TRACT
  Filled 2022-11-17: qty 7

## 2022-11-17 MED ORDER — IPRATROPIUM-ALBUTEROL 0.5-2.5 (3) MG/3ML IN SOLN: 3.0000 mL | RESPIRATORY_TRACT | Status: DC

## 2022-11-17 MED ORDER — CLOPIDOGREL BISULFATE 75 MG PO TABS
75.0000 mg | ORAL_TABLET | Freq: Every morning | ORAL | Status: DC
  Administered 2022-11-17 – 2022-11-19 (×3): 75 mg via ORAL
  Filled 2022-11-17 (×3): qty 1

## 2022-11-17 NOTE — Progress Notes (Signed)
Subjective:  Feeling much better this morning. She denies abdominal pain, nausea, vomiting, and has high energy and is ready to go home.   She denies shortness of breath, does believe she is wheezing a little bit, but has been taking the 1L of O2 [place] off periodically and feeling fine.   She describes that she had a GI illness a few weeks ago and had an episode of fecal incontinence overnight, which she thinks may have been a source for her urosepsis, and she also uses pads for urinary incontinence.  Objective:  Vital signs in last 24 hours: Vitals:   11/17/22 0027 11/17/22 0450 11/17/22 0832 11/17/22 0835  BP: (!) 118/58 (!) 134/56 133/61   Pulse: 85 87 88   Resp: '15 19 16   '$ Temp: 98.6 F (37 C) 98 F (36.7 C)    TempSrc: Oral Oral Oral   SpO2: 95% 94% 95% 95%  Weight:      Height:        Physical Exam:  Constitutional: well-appearing obese female in no acute distress CV: Regular rate and rhythm, no murmurs, gallops Pulm: Normal work of breathing on 0.5L [place], scattered wheezes, diminished breath sounds at lung bases bilaterally Abdominal exam: Soft, non-distended, non-tender, normoactive bowel sounds Extremities: trace edema bilateral lower extremities Neuro: alert, engaged, conversive  Labs     Latest Ref Rng & Units 11/17/2022    3:54 AM 11/16/2022    9:16 AM 11/16/2022    8:41 AM  CBC  WBC 4.0 - 10.5 K/uL 17.3   10.1   Hemoglobin 12.0 - 15.0 g/dL 11.7  13.9  13.3   Hematocrit 36.0 - 46.0 % 35.9  41.0  41.5   Platelets 150 - 400 K/uL 157   204        Latest Ref Rng & Units 11/17/2022    3:54 AM 11/16/2022    9:16 AM 11/16/2022    8:41 AM  CMP  Glucose 70 - 99 mg/dL 171  219  214   BUN 8 - 23 mg/dL '19  17  16   '$ Creatinine 0.44 - 1.00 mg/dL 1.12  1.00  1.06   Sodium 135 - 145 mmol/L 141  138  139   Potassium 3.5 - 5.1 mmol/L 4.0  3.7  3.7   Chloride 98 - 111 mmol/L 102  99  97   CO2 22 - 32 mmol/L 30   26   Calcium 8.9 - 10.3 mg/dL 8.8   9.7   Total  Protein 6.5 - 8.1 g/dL   7.0   Total Bilirubin 0.3 - 1.2 mg/dL   1.4   Alkaline Phos 38 - 126 U/L   71   AST 15 - 41 U/L   18   ALT 0 - 44 U/L   13    Urine Culture: >=100,000 COLONIES/mL ESCHERICHIA COLI  SUSCEPTIBILITIES TO FOLLOW   Blood Culture: 2/4 BOTTLES ESCHERICHIA COLI SUSCEPTIBILITIES TO FOLLOW  Summary [name] is a 61 y.o. person living with a history of aortic stenosis s/p TAVR, T2DM, COPD, asthma, CAD, OSA, HTN, HLD, anxiety/depression and hx of PE who presented with RLQ pain, nausea, vomiting, wheezing, and hypoxia admitted for infected right ureteral stone and COPD exacerbation.   Assessment/Plan:  Active Problems:   COPD exacerbation (HCC)  Sepsis 2/2 E. Coli Bacteremia & Urinary Tract Infection Patient meets criteria for sepsis (Bcx, fever, tachycardia, mental status change) and was found to have E. Coli in 2/4 bottles from blood  culture and E coli in urine culture, suggesting patient sepsis due to UTI and bacteremia. Etiologies for sepsis include the 97m obstructing stone seen on CT, though it was not found during cystoscopy and may have passed prior to procedure. Other possibilities include recent history of fecal and urinary incontinence. Overnight her antibiotic regimen was adjusted to 2g ceftriaxone, will plan to reevaluate once sensitivities are back. Reassuring that patient felt much improved today, remained afebrile overnight with normalizing respiratory rate, blood pressure.  Appears euvolemic on exam with normalizing blood pressures, but will trend creatinine to see if fluids necessary  - Ceftriaxone 2g q24h, received 1g dose in ED on 11/19 and 2g dose on 11/20 (Day 2 abx).  - continue to monitor for vital sign abnormalities - am BMP  Acute COPD Exacerbation Leukocytosis Reassured that she is breathing comfortably and satting well on 0.5L [place], which she takes off periodically and feels okay without. This is back to her baseline O2 requirement, she has  up to 3L at home but usually remains off of it. With her presentation much improved after 1 day of steroids and antibiotics, will continue current treatment regimen. Her white count increased, though this is likely explained by steroids rather than underlying infection, will continue to trend.  - prednisone '40mg'$  x 5 days (start 11/19) - Azithromycin 500 loading dose x 1 11/19, then '250mg'$  x 4 start 11/20 - Ceftriaxone 2g q24h, received 1g dose in ED on 11/19 and 2g dose on 11/20 - Continue home inhalers - AM CBC  Type 2 Diabetes Diabetic Neuropathy Patient's sugars consistently below 200 with patient receiving 1/2 home long acting insulin and SSI, receiving 3U at mealtimes.Will continue to monitor, especially due to patient receiving steroids for COPD exacerbation.  - SSI novolog 0-15 U qhs - 1/2 home insulin semglee 15 U qhs - continue gabapentin '600mg'$  am '900mg'$  pm  CAD AS s/p TAVR Discussed with urology, some degree of hematuria expected after post-up and this is not a reason to hold plavix. Restarted home plavix - clopidogrel '75mg'$   Barriers to care Patient is chronically ill with multiple co-morbidities and appears to be the primary care taker for her husband, however, struggles to manage her own medications appears to need increasing levels of mobility assistance at home. TOC consulted, she denied issues with accessibility and medication management.    HTN On this regimen, systolic range from 1263-335 645-62diastolic this morning. Continue home meds. - amlodipine-atorvastatin 5-'80mg'$  - losartan-hydrochlorothiazide 100-'25mg'$  - metoprolol succinate '50mg'$    HLD Continue home meds - amlodipine-atorvastatin 5-'80mg'$  - ezetimibe '10mg'$    Anxiety/Depression Patient states that she takes celexa in the morning and afternoon, and buspar at night, which is inconsistent with prior charting, however given patient confusion about which meds she takes in general, will continue  treatment as per last  documented regimen - celexa '20mg'$  am starting tomorrow - buspirone '10mg'$  BID starting tonight - trazodone '50mg'$       DIET: Heart Healthy IVF: none  DVT PPX: SCDs BOWEL: senna prn CODE: full FAM COM: spoke with husband last night   PT/OT recs: not consulted Dispo: home Barriers to discharge: clinical improvement    LOS: 0 days   JAnnia Belt Medical Student 11/17/2022, 10:57 AM

## 2022-11-17 NOTE — TOC Initial Note (Signed)
Transition of Care [facility]) - Initial/Assessment Note    Patient Details  Name: [name] MRN: [id number] Date of Birth: Aug 22, 1961  [facility]) CM/SW Contact:    Pollie Friar, RN Phone Number: 11/17/2022, 11:21 AM  Clinical Narrative:                 Pt is from home with her spouse. She states he is with her most of the time and can assist some physically. He is on oxygen at home.  Pt drives self as needed but spouse also drives.  She manages her own medications and denies any issues.  Spouse will transport home when medically ready.  Expected Discharge Plan: Home/Self Care Barriers to Discharge: Continued Medical Work up   Patient Goals and CMS Choice        Expected Discharge Plan and Services Expected Discharge Plan: Home/Self Care                                              Prior Living Arrangements/Services   Lives with:: Spouse Patient language and need for interpreter reviewed:: Yes Do you feel safe going back to the place where you live?: Yes        Care giver support system in place?: Yes (comment) Current home services: DME (cane/ shower seat) Criminal Activity/Legal Involvement Pertinent to Current Situation/Hospitalization: No - Comment as needed  Activities of Daily Living Home Assistive Devices/Equipment: Cane (specify quad or straight) ADL Screening (condition at time of admission) Patient's cognitive ability adequate to safely complete daily activities?: Yes Is the patient deaf or have difficulty hearing?: No Does the patient have difficulty seeing, even when wearing glasses/contacts?: No Does the patient have difficulty concentrating, remembering, or making decisions?: No Patient able to express need for assistance with ADLs?: Yes Does the patient have difficulty dressing or bathing?: No Independently performs ADLs?: Yes (appropriate for developmental age) Does the patient have difficulty walking or climbing  stairs?: Yes Weakness of Legs: Both Weakness of Arms/Hands: Both  Permission Sought/Granted                  Emotional Assessment Appearance:: Appears stated age Attitude/Demeanor/Rapport: Engaged Affect (typically observed): Accepting Orientation: : Oriented to Self, Oriented to Place, Oriented to  Time, Oriented to Situation   Psych Involvement: No (comment)  Admission diagnosis:  COPD exacerbation ([place]) [J44.1] Patient Active Problem List   Diagnosis Date Noted   COPD exacerbation (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   Healthcare maintenance 11/17/2021   Spondylolisthesis, lumbar region 02/11/2021    Class: Acute   Malignant neoplasm of upper-outer quadrant of right breast in female, estrogen receptor positive ([place]) 06/10/2018   Restless leg syndrome 02/04/2018   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   Depression 08/06/2015   Hypertension associated with diabetes ([place])    Aortic stenosis    Chronic diastolic congestive heart failure (HCC)    COPD (chronic obstructive pulmonary disease) ([place]) 09/21/2013   Obstructive sleep apnea 09/21/2013   Nocturnal hypoxemia 09/12/2013  Hyperlipidemia 11/30/2012   History of pulmonary embolus (PE) 07/09/2012   Normocytic anemia 02/08/2012   Type 2 diabetes mellitus without complication, without long-term current use of insulin ([place]) 02/08/2012   Asthma 02/08/2012   GERD (gastroesophageal reflux disease)    CAD (coronary artery disease) 12/08/2011   PCP:  [name], MD Pharmacy:   [facility] [place] [address] Phone: [phone] Fax: [phone]     Social Determinants of Health (SDOH) Interventions Housing Interventions: Intervention Not Indicated  Readmission Risk Interventions     No data to display

## 2022-11-17 NOTE — Progress Notes (Signed)
PHARMACY - PHYSICIAN COMMUNICATION CRITICAL VALUE ALERT - BLOOD CULTURE IDENTIFICATION (BCID)  [name] FISHBURN is an 61 y.o. female who presented to [facility] on 11/16/2022 with a chief complaint of UTI   Name of physician (or Provider) Contacted: Dr. Stann Mainland  Current antibiotics: Ceftriaxone 1g IV q24h  Changes to prescribed antibiotics recommended:  Increase Ceftriaxone to 2g IV q24h  Results for orders placed or performed during the hospital encounter of 11/16/22  Blood Culture ID Panel (Reflexed) (Collected: 11/16/2022  8:41 AM)  Result Value Ref Range   Enterococcus faecalis NOT DETECTED NOT DETECTED   Enterococcus Faecium NOT DETECTED NOT DETECTED   Listeria monocytogenes NOT DETECTED NOT DETECTED   Staphylococcus species NOT DETECTED NOT DETECTED   Staphylococcus aureus (BCID) NOT DETECTED NOT DETECTED   Staphylococcus epidermidis NOT DETECTED NOT DETECTED   Staphylococcus lugdunensis NOT DETECTED NOT DETECTED   Streptococcus species NOT DETECTED NOT DETECTED   Streptococcus agalactiae NOT DETECTED NOT DETECTED   Streptococcus pneumoniae NOT DETECTED NOT DETECTED   Streptococcus pyogenes NOT DETECTED NOT DETECTED   A.calcoaceticus-baumannii NOT DETECTED NOT DETECTED   Bacteroides fragilis NOT DETECTED NOT DETECTED   Enterobacterales DETECTED (A) NOT DETECTED   Enterobacter cloacae complex NOT DETECTED NOT DETECTED   Escherichia coli DETECTED (A) NOT DETECTED   Klebsiella aerogenes NOT DETECTED NOT DETECTED   Klebsiella oxytoca NOT DETECTED NOT DETECTED   Klebsiella pneumoniae NOT DETECTED NOT DETECTED   Proteus species NOT DETECTED NOT DETECTED   Salmonella species NOT DETECTED NOT DETECTED   Serratia marcescens NOT DETECTED NOT DETECTED   Haemophilus influenzae NOT DETECTED NOT DETECTED   Neisseria meningitidis NOT DETECTED NOT DETECTED   Pseudomonas aeruginosa NOT DETECTED NOT DETECTED   Stenotrophomonas maltophilia NOT DETECTED NOT DETECTED   Candida albicans  NOT DETECTED NOT DETECTED   Candida auris NOT DETECTED NOT DETECTED   Candida glabrata NOT DETECTED NOT DETECTED   Candida krusei NOT DETECTED NOT DETECTED   Candida parapsilosis NOT DETECTED NOT DETECTED   Candida tropicalis NOT DETECTED NOT DETECTED   Cryptococcus neoformans/gattii NOT DETECTED NOT DETECTED   CTX-M ESBL NOT DETECTED NOT DETECTED   Carbapenem resistance IMP NOT DETECTED NOT DETECTED   Carbapenem resistance KPC NOT DETECTED NOT DETECTED   Carbapenem resistance NDM NOT DETECTED NOT DETECTED   Carbapenem resist OXA 48 LIKE NOT DETECTED NOT DETECTED   Carbapenem resistance VIM NOT DETECTED NOT DETECTED    Narda Bonds 11/17/2022  3:57 AM

## 2022-11-17 NOTE — Plan of Care (Signed)
  Problem: Education: Goal: Knowledge of General Education information will improve Description: Including pain rating scale, medication(s)/side effects and non-pharmacologic comfort measures Outcome: Progressing   Problem: Clinical Measurements: Goal: Will remain free from infection Outcome: Progressing Goal: Diagnostic test results will improve Outcome: Progressing Goal: Respiratory complications will improve Outcome: Progressing   Problem: Activity: Goal: Risk for activity intolerance will decrease Outcome: Progressing   Problem: Coping: Goal: Level of anxiety will decrease Outcome: Progressing

## 2022-11-18 DIAGNOSIS — E1159 Type 2 diabetes mellitus with other circulatory complications: Secondary | ICD-10-CM

## 2022-11-18 DIAGNOSIS — E876 Hypokalemia: Secondary | ICD-10-CM | POA: Diagnosis not present

## 2022-11-18 DIAGNOSIS — Z6841 Body Mass Index (BMI) 40.0 and over, adult: Secondary | ICD-10-CM | POA: Diagnosis not present

## 2022-11-18 DIAGNOSIS — B9620 Unspecified Escherichia coli [E. coli] as the cause of diseases classified elsewhere: Secondary | ICD-10-CM | POA: Diagnosis not present

## 2022-11-18 DIAGNOSIS — F419 Anxiety disorder, unspecified: Secondary | ICD-10-CM | POA: Diagnosis present

## 2022-11-18 DIAGNOSIS — G9341 Metabolic encephalopathy: Secondary | ICD-10-CM | POA: Diagnosis not present

## 2022-11-18 DIAGNOSIS — I5032 Chronic diastolic (congestive) heart failure: Secondary | ICD-10-CM | POA: Diagnosis not present

## 2022-11-18 DIAGNOSIS — R1031 Right lower quadrant pain: Secondary | ICD-10-CM | POA: Diagnosis not present

## 2022-11-18 DIAGNOSIS — G2581 Restless legs syndrome: Secondary | ICD-10-CM | POA: Diagnosis not present

## 2022-11-18 DIAGNOSIS — I2510 Atherosclerotic heart disease of native coronary artery without angina pectoris: Secondary | ICD-10-CM

## 2022-11-18 DIAGNOSIS — I152 Hypertension secondary to endocrine disorders: Secondary | ICD-10-CM

## 2022-11-18 DIAGNOSIS — N200 Calculus of kidney: Secondary | ICD-10-CM | POA: Diagnosis not present

## 2022-11-18 DIAGNOSIS — N136 Pyonephrosis: Secondary | ICD-10-CM | POA: Diagnosis not present

## 2022-11-18 DIAGNOSIS — I110 Hypertensive heart disease with heart failure: Secondary | ICD-10-CM | POA: Diagnosis not present

## 2022-11-18 DIAGNOSIS — Z853 Personal history of malignant neoplasm of breast: Secondary | ICD-10-CM | POA: Diagnosis not present

## 2022-11-18 DIAGNOSIS — J441 Chronic obstructive pulmonary disease with (acute) exacerbation: Secondary | ICD-10-CM | POA: Diagnosis not present

## 2022-11-18 DIAGNOSIS — Z96651 Presence of right artificial knee joint: Secondary | ICD-10-CM | POA: Diagnosis present

## 2022-11-18 DIAGNOSIS — E1140 Type 2 diabetes mellitus with diabetic neuropathy, unspecified: Secondary | ICD-10-CM | POA: Diagnosis not present

## 2022-11-18 DIAGNOSIS — Z9981 Dependence on supplemental oxygen: Secondary | ICD-10-CM | POA: Diagnosis not present

## 2022-11-18 DIAGNOSIS — Z96612 Presence of left artificial shoulder joint: Secondary | ICD-10-CM | POA: Diagnosis present

## 2022-11-18 DIAGNOSIS — Z952 Presence of prosthetic heart valve: Secondary | ICD-10-CM | POA: Diagnosis not present

## 2022-11-18 DIAGNOSIS — E785 Hyperlipidemia, unspecified: Secondary | ICD-10-CM | POA: Diagnosis not present

## 2022-11-18 DIAGNOSIS — Z794 Long term (current) use of insulin: Secondary | ICD-10-CM | POA: Diagnosis not present

## 2022-11-18 DIAGNOSIS — Z923 Personal history of irradiation: Secondary | ICD-10-CM | POA: Diagnosis not present

## 2022-11-18 DIAGNOSIS — N138 Other obstructive and reflux uropathy: Secondary | ICD-10-CM | POA: Diagnosis not present

## 2022-11-18 DIAGNOSIS — A4151 Sepsis due to Escherichia coli [E. coli]: Secondary | ICD-10-CM | POA: Diagnosis not present

## 2022-11-18 DIAGNOSIS — Z87891 Personal history of nicotine dependence: Secondary | ICD-10-CM | POA: Diagnosis not present

## 2022-11-18 DIAGNOSIS — F32A Depression, unspecified: Secondary | ICD-10-CM | POA: Diagnosis present

## 2022-11-18 LAB — CBC WITH DIFFERENTIAL/PLATELET
Abs Immature Granulocytes: 0.05 10*3/uL (ref 0.00–0.07)
Basophils Absolute: 0 10*3/uL (ref 0.0–0.1)
Basophils Relative: 0 %
Eosinophils Absolute: 0.1 10*3/uL (ref 0.0–0.5)
Eosinophils Relative: 1 %
HCT: 33.4 % — ABNORMAL LOW (ref 36.0–46.0)
Hemoglobin: 10.8 g/dL — ABNORMAL LOW (ref 12.0–15.0)
Immature Granulocytes: 1 %
Lymphocytes Relative: 10 %
Lymphs Abs: 1.1 10*3/uL (ref 0.7–4.0)
MCH: 27.8 pg (ref 26.0–34.0)
MCHC: 32.3 g/dL (ref 30.0–36.0)
MCV: 86.1 fL (ref 80.0–100.0)
Monocytes Absolute: 0.7 10*3/uL (ref 0.1–1.0)
Monocytes Relative: 6 %
Neutro Abs: 9 10*3/uL — ABNORMAL HIGH (ref 1.7–7.7)
Neutrophils Relative %: 82 %
Platelets: 148 10*3/uL — ABNORMAL LOW (ref 150–400)
RBC: 3.88 MIL/uL (ref 3.87–5.11)
RDW: 13.9 % (ref 11.5–15.5)
WBC: 11 10*3/uL — ABNORMAL HIGH (ref 4.0–10.5)
nRBC: 0 % (ref 0.0–0.2)

## 2022-11-18 LAB — CULTURE, BLOOD (ROUTINE X 2)

## 2022-11-18 LAB — URINE CULTURE: Culture: >=100000 — AB

## 2022-11-18 LAB — GLUCOSE, CAPILLARY
Glucose-Capillary: 163 mg/dL — ABNORMAL HIGH (ref 70–99)
Glucose-Capillary: 198 mg/dL — ABNORMAL HIGH (ref 70–99)
Glucose-Capillary: 149 mg/dL — ABNORMAL HIGH (ref 70–99)
Glucose-Capillary: 194 mg/dL — ABNORMAL HIGH (ref 70–99)
Glucose-Capillary: 244 mg/dL — ABNORMAL HIGH (ref 70–99)
Glucose-Capillary: 236 mg/dL — ABNORMAL HIGH (ref 70–99)

## 2022-11-18 LAB — BASIC METABOLIC PANEL
Anion gap: 12 (ref 5–15)
BUN: 21 mg/dL (ref 8–23)
CO2: 29 mmol/L (ref 22–32)
Calcium: 8.8 mg/dL — ABNORMAL LOW (ref 8.9–10.3)
Chloride: 98 mmol/L (ref 98–111)
Creatinine, Ser: 0.92 mg/dL (ref 0.44–1.00)
GFR, Estimated: >60 mL/min (ref >60)
Glucose, Bld: 152 mg/dL — ABNORMAL HIGH (ref 70–99)
Potassium: 3.2 mmol/L — ABNORMAL LOW (ref 3.5–5.1)
Sodium: 139 mmol/L (ref 135–145)

## 2022-11-18 LAB — MAGNESIUM: Magnesium: 1.8 mg/dL (ref 1.7–2.4)

## 2022-11-18 MED ORDER — CEFADROXIL 500 MG PO CAPS
1000.0000 mg | ORAL_CAPSULE | Freq: Two times a day (BID) | ORAL | Status: DC
  Filled 2022-11-18: qty 2

## 2022-11-18 MED ORDER — POTASSIUM CHLORIDE CRYS ER 20 MEQ PO TBCR
40.0000 meq | EXTENDED_RELEASE_TABLET | Freq: Once | ORAL | Status: AC
  Administered 2022-11-18: 40 meq via ORAL
  Filled 2022-11-18: qty 2

## 2022-11-18 NOTE — Progress Notes (Addendum)
Initial Nutrition Assessment  DOCUMENTATION CODES:   Morbid obesity  INTERVENTION:  - Continue current diet order.   NUTRITION DIAGNOSIS:   Inadequate oral intake related to poor appetite as evidenced by per patient/family report (PTA).  GOAL:   Patient will meet greater than or equal to 90% of their needs   MONITOR:   PO intake  REASON FOR ASSESSMENT:   Malnutrition Screening Tool    ASSESSMENT:   61 y.o. female admits related to abdominal pain, nausea, vomiting, wheezing. PMH includes: HTN, COPD, DM. Pt is currently receiving medical management for Sepsis secondary to E.Coli and UTI.  Meds include: ferrous sulfate, sliding sclae insulin, Semglee (15 units), prednisone. Labs reviewed: K low. FS Glucose 163-194 mg/dL.  The pt reports that she has been eating well since admission. Pt had eaten 100% of her lunch at time of assessment. Pt states that she did not have much of an appetite PTA related to pain from kidney stone. Pt is meeting her needs at this time.   NUTRITION - FOCUSED PHYSICAL EXAM:  Flowsheet Row Most Recent Value  Orbital Region No depletion  Upper Arm Region No depletion  Thoracic and Lumbar Region No depletion  Buccal Region No depletion  Temple Region No depletion  Clavicle Bone Region No depletion  Clavicle and Acromion Bone Region No depletion  Scapular Bone Region No depletion  Dorsal Hand No depletion  Patellar Region No depletion  Anterior Thigh Region No depletion  Posterior Calf Region No depletion  Edema (RD Assessment) None  Hair Reviewed  Eyes Reviewed  Mouth Reviewed  Skin Reviewed  Nails Reviewed       Diet Order:   Diet Order             Diet Heart Room service appropriate? Yes; Fluid consistency: Thin  Diet effective now                   EDUCATION NEEDS:   No education needs have been identified at this time  Skin:  Skin Assessment: Reviewed RN Assessment  Last BM:  11/21  Height:   Ht Readings from  Last 1 Encounters:  11/16/22 5' 2.99" (1.6 m)    Weight:   Wt Readings from Last 1 Encounters:  11/16/22 (!) 137 kg    Ideal Body Weight:  52.3 kg  BMI:  Body mass index is 53.51 kg/m.  Estimated Nutritional Needs:   Kcal:  3818-2993 kcals  Protein:  75-95 gm  Fluid:  >/= 1.5 L  Thalia Bloodgood, RD, LDN, CNSC.

## 2022-11-18 NOTE — Progress Notes (Signed)
Subjective:  Patient feeling well this morning. Denies shortness of breath, abdominal pain, dysuria. Is disappointed that she is not going home today and would if at all possible like to be home for Thanksgiving.   Objective:  Vital signs in last 24 hours: Vitals:   11/18/22 0339 11/18/22 0732 11/18/22 0853 11/18/22 1150  BP: 111/60 138/69  137/67  Pulse: 84 85  88  Resp: '16 16  17  '$ Temp: 97.9 F (36.6 C) 98.1 F (36.7 C)  98.1 F (36.7 C)  TempSrc: Oral Oral  Oral  SpO2: (!) 86% 90% 92% 93%  Weight:      Height:        Physical Exam:  Constitutional: well-appearing, obese, female, sitting comfortably in bed CV: Regular rate and rhythm, no murmurs, gallops Pulm: Normal work of breathing on room air, diminished breath sounds at lung bases bilaterally Abdominal exam: Soft, non-distended, non-tender, normoactive bowel sounds Extremities: trace edema bilateral lower extremities Neuro: alert, engaged, conversive  Labs     Latest Ref Rng & Units 11/18/2022    3:57 AM 11/17/2022    3:54 AM 11/16/2022    9:16 AM  CBC  WBC 4.0 - 10.5 K/uL 11.0  17.3    Hemoglobin 12.0 - 15.0 g/dL 10.8  11.7  13.9   Hematocrit 36.0 - 46.0 % 33.4  35.9  41.0   Platelets 150 - 400 K/uL 148  157         Latest Ref Rng & Units 11/18/2022    3:57 AM 11/17/2022    3:54 AM 11/16/2022    9:16 AM  CMP  Glucose 70 - 99 mg/dL 152  171  219   BUN 8 - 23 mg/dL '21  19  17   '$ Creatinine 0.44 - 1.00 mg/dL 0.92  1.12  1.00   Sodium 135 - 145 mmol/L 139  141  138   Potassium 3.5 - 5.1 mmol/L 3.2  4.0  3.7   Chloride 98 - 111 mmol/L 98  102  99   CO2 22 - 32 mmol/L 29  30    Calcium 8.9 - 10.3 mg/dL 8.8  8.8     Repeat Bcx pending  Summary [name] is a 61 y.o. person living with a history of aortic stenosis s/p TAVR, T2DM, COPD, asthma, CAD, OSA, HTN, HLD, anxiety/depression and hx of PE who presented with RLQ pain, nausea, vomiting, wheezing, and hypoxia admitted for infected right  ureteral stone and COPD exacerbation.   Assessment/Plan:  Active Problems:   Urinary tract obstruction by kidney stone   COPD exacerbation (HCC)   Sepsis due to Escherichia coli with encephalopathy and septic shock ([place])  Sepsis (resolved)  E. Coli Bacteremia & Urinary Tract Infection Leukocytosis (resolving)  Patient now afebrile, normal mentation, and no tachycardia. Repeat blood cultures pending. Given patient has TAVR, feel appropriate to continue IV antibiotics at this time, however, patient is eager to return home for thanksgiving and feel it would be appropriate for discharge tomorrow with transition to PO antibiotics to complete 14 day course. Will trend leukocytosis  - Ceftriaxone 2g x 1 dose tonight, will finish regimen with cephadroxil 1 gram bid to complete 14 days from start of CTX which was 11/19. End date for cefadroxil is 11/29/2022  - F/u repeat bcx - am CBC  Acute COPD Exacerbation Reassuring that patient breathing comfortably and satting well on room air while resting. She may have ambulatory O2 requirement, she has home O2 up to  3L. Appropriate to discontinue azithromycin at this time given patient's respiratory status, will continue on ceftriaxone given above.  - prednisone '40mg'$  x 5 days (finish 11/23) - Ceftriaxone 2g x 1 dose tonight, will finish regimen with cephadroxil 1 gram bid to complete 14 days from start of CTX which was 11/19. End date for cefadroxil is 11/29/2022  - Continue home inhalers  Hypokalemia K was 3.2 on BMP this morning. Repleted with 40 mEq, will recheck tomorrow - am BMP  Type 2 Diabetes Diabetic neuropathy CBG 149-214 in past 24 hours.  - SSI novolog 0-15 U qhs - 1/2 home insulin semglee 15 U qhs - continue gabapentin '600mg'$  am '900mg'$  pm  CAD AS s/p TAVR - clopidogrel '75mg'$    HTN On this regimen, systolic range from 161-096 and diastolic 04-54 this morning. Continue home meds. - amlodipine-atorvastatin 5-'80mg'$  -  losartan-hydrochlorothiazide 100-'25mg'$  - metoprolol succinate '50mg'$    HLD Continue home meds - amlodipine-atorvastatin 5-'80mg'$  - ezetimibe '10mg'$    Anxiety/Depression - celexa '20mg'$  am  - buspirone '10mg'$  BID  - trazodone '50mg'$    DIET: Heart healthy IVF: none DVT PPX: SCDs BOWEL: senna prn CODE: full FAM COM: none   PT/OT recs: not consulted Dispo: home Barriers to discharge: clinical improvement  Annia Belt, Medical Student 11/18/2022, 3:05 PM

## 2022-11-18 NOTE — Plan of Care (Signed)

## 2022-11-19 ENCOUNTER — Telehealth (HOSPITAL_COMMUNITY): Payer: Self-pay | Admitting: Licensed Clinical Social Worker

## 2022-11-19 ENCOUNTER — Ambulatory Visit (HOSPITAL_COMMUNITY): Payer: Medicaid Other | Admitting: Licensed Clinical Social Worker

## 2022-11-19 ENCOUNTER — Other Ambulatory Visit (HOSPITAL_COMMUNITY): Payer: Self-pay

## 2022-11-19 ENCOUNTER — Encounter (HOSPITAL_COMMUNITY): Payer: Self-pay

## 2022-11-19 DIAGNOSIS — N138 Other obstructive and reflux uropathy: Secondary | ICD-10-CM | POA: Diagnosis not present

## 2022-11-19 DIAGNOSIS — I2510 Atherosclerotic heart disease of native coronary artery without angina pectoris: Secondary | ICD-10-CM | POA: Diagnosis not present

## 2022-11-19 DIAGNOSIS — N200 Calculus of kidney: Secondary | ICD-10-CM | POA: Diagnosis not present

## 2022-11-19 DIAGNOSIS — Z87891 Personal history of nicotine dependence: Secondary | ICD-10-CM | POA: Diagnosis not present

## 2022-11-19 DIAGNOSIS — I152 Hypertension secondary to endocrine disorders: Secondary | ICD-10-CM | POA: Diagnosis not present

## 2022-11-19 DIAGNOSIS — E1159 Type 2 diabetes mellitus with other circulatory complications: Secondary | ICD-10-CM | POA: Diagnosis not present

## 2022-11-19 DIAGNOSIS — J441 Chronic obstructive pulmonary disease with (acute) exacerbation: Secondary | ICD-10-CM | POA: Diagnosis not present

## 2022-11-19 LAB — CBC WITH DIFFERENTIAL/PLATELET
Abs Immature Granulocytes: 0.05 10*3/uL (ref 0.00–0.07)
Basophils Absolute: 0 10*3/uL (ref 0.0–0.1)
Basophils Relative: 0 %
Eosinophils Absolute: 0.2 10*3/uL (ref 0.0–0.5)
Eosinophils Relative: 2 %
HCT: 36.8 % (ref 36.0–46.0)
Hemoglobin: 11.7 g/dL — ABNORMAL LOW (ref 12.0–15.0)
Immature Granulocytes: 1 %
Lymphocytes Relative: 13 %
Lymphs Abs: 1 10*3/uL (ref 0.7–4.0)
MCH: 27.6 pg (ref 26.0–34.0)
MCHC: 31.8 g/dL (ref 30.0–36.0)
MCV: 86.8 fL (ref 80.0–100.0)
Monocytes Absolute: 0.6 10*3/uL (ref 0.1–1.0)
Monocytes Relative: 8 %
Neutro Abs: 5.7 10*3/uL (ref 1.7–7.7)
Neutrophils Relative %: 76 %
Platelets: 148 10*3/uL — ABNORMAL LOW (ref 150–400)
RBC: 4.24 MIL/uL (ref 3.87–5.11)
RDW: 13.7 % (ref 11.5–15.5)
WBC: 7.6 10*3/uL (ref 4.0–10.5)
nRBC: 0 % (ref 0.0–0.2)

## 2022-11-19 LAB — BASIC METABOLIC PANEL
Anion gap: 11 (ref 5–15)
BUN: 15 mg/dL (ref 8–23)
CO2: 27 mmol/L (ref 22–32)
Calcium: 9.1 mg/dL (ref 8.9–10.3)
Chloride: 102 mmol/L (ref 98–111)
Creatinine, Ser: 0.95 mg/dL (ref 0.44–1.00)
GFR, Estimated: >60 mL/min (ref >60)
Glucose, Bld: 155 mg/dL — ABNORMAL HIGH (ref 70–99)
Potassium: 4.2 mmol/L (ref 3.5–5.1)
Sodium: 140 mmol/L (ref 135–145)

## 2022-11-19 LAB — GLUCOSE, CAPILLARY
Glucose-Capillary: 154 mg/dL — ABNORMAL HIGH (ref 70–99)
Glucose-Capillary: 195 mg/dL — ABNORMAL HIGH (ref 70–99)
Glucose-Capillary: 214 mg/dL — ABNORMAL HIGH (ref 70–99)

## 2022-11-19 LAB — SEDIMENTATION RATE: Sed Rate: 74 mm/hr — ABNORMAL HIGH (ref 0–22)

## 2022-11-19 MED ORDER — AMLODIPINE-ATORVASTATIN 5-80 MG PO TABS: 1.0000 | ORAL_TABLET | Freq: Every day | ORAL | 3 refills | Status: DC

## 2022-11-19 MED ORDER — PREDNISONE 20 MG PO TABS
40.0000 mg | ORAL_TABLET | Freq: Every day | ORAL | 0 refills | Status: AC
  Filled 2022-11-19: qty 2, 1d supply, fill #0

## 2022-11-19 MED ORDER — CEFADROXIL 500 MG PO CAPS
1000.0000 mg | ORAL_CAPSULE | Freq: Two times a day (BID) | ORAL | 0 refills | Status: AC
  Filled 2022-11-19: qty 42, 11d supply, fill #0

## 2022-11-19 NOTE — Plan of Care (Signed)

## 2022-11-19 NOTE — TOC Transition Note (Signed)
Transition of Care [facility]) - CM/SW Discharge Note   Patient Details  Name: [name] MRN: [id number] Date of Birth: 1961/05/02  [facility]) CM/SW Contact:  Pollie Friar, RN Phone Number: 11/19/2022, 11:17 AM   Clinical Narrative:    Pt discharging home with self care. No needs per TOC.   Final next level of care: Home/Self Care Barriers to Discharge: No Barriers Identified   Patient Goals and CMS Choice        Discharge Placement                       Discharge Plan and Services                                     Social Determinants of Health (SDOH) Interventions Housing Interventions: Intervention Not Indicated   Readmission Risk Interventions     No data to display

## 2022-11-19 NOTE — Plan of Care (Signed)

## 2022-11-19 NOTE — Telephone Encounter (Signed)
Pt reports she is currently being discharged from hospital and cannot complete appointment. LCSW will mark pt as left w/o being seen today and reschedule pt for Jan 5th 11am.

## 2022-11-19 NOTE — Progress Notes (Signed)
Discharge instructions reviewed with patient.  Understanding verbalized. NSL/Telemetry removed.

## 2022-11-19 NOTE — Discharge Summary (Signed)
Name: [name] MRN: [id number] DOB: 13-Jul-1961 61 y.o. PCP: [name] Dame, MD  Date of Admission: 11/16/2022  8:16 AM Date of Discharge:  Attending Physician: [name] Riches, MD  Discharge Diagnosis: 1. E. Coli Bacteremia, UTI secondary to E. coli 2. COPD 3. T2DM c/b neuropathy 4. CAD 5. Aortic stenosis s/p TAVR 6. HTN 7. HLD 8. Anxiety/depression 9. 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   amlodipine-atorvastatin 5-80 MG tablet Commonly known as: Caduet Take 1 tablet by mouth daily.   Aspirin Adult Low Strength 81 MG tablet Generic drug: aspirin EC TAKE 1 Tablet BY MOUTH ONCE DAILY   B-D UF III MINI PEN NEEDLES 31G X 5 MM Misc Generic drug: Insulin Pen Needle USE AS DIRECTED DAILY   busPIRone 10 MG tablet Commonly known as: BUSPAR Take 1 tablet (10 mg total) by mouth 3 (three) times daily. What changed: when to take this   cefadroxil 500 MG capsule Commonly known as: DURICEF Take 2 capsules (1,000 mg total) by mouth 2 (two) times daily for 11 days.   citalopram 20 MG tablet Commonly known as: CELEXA TAKE 1 TABLET BY MOUTH ONCE DAILY. DO NOT TAKE WHILE TAKING DULOXETINE What changed:  how much to take how to take this when to take this   clopidogrel 75 MG tablet Commonly known as: PLAVIX TAKE 1 TABLET BY MOUTH IN THE MORNING What changed: when to take this   exemestane 25 MG tablet Commonly known as: AROMASIN Take 1 tablet (25 mg total) by mouth daily after breakfast.   ezetimibe 10 MG tablet Commonly known as: ZETIA Take 1 tablet (10 mg total) by mouth daily.   ferrous  sulfate 325 (65 FE) MG tablet Take 1 tablet (325 mg total) by mouth every other day.   fexofenadine 180 MG tablet Commonly known as: ALLEGRA Take 1 tablet (180 mg total) by mouth daily.   fluticasone 50 MCG/ACT nasal spray Commonly known as: FLONASE Place 1 spray into both nostrils daily. Begin by using 2 sprays in each nare daily for 3 to 5 days, then decrease to 1 spray in each nare daily.   FreeStyle Libre 3 Sensor Misc Place 1 sensor on the skin every 14 days. Use to check glucose continuously   furosemide 40 MG tablet Commonly known as: LASIX Take 1 tablet (40 mg total) by mouth as needed for fluid or edema. What changed: when to take this   gabapentin 300 MG capsule Commonly known as: NEURONTIN TAKE 2 CAPSULES BY MOUTH IN THE MORNING AND TAKE 3 CAPSULES AT BEDTIME What changed: See the new instructions.   insulin glargine-yfgn 100 UNIT/ML Pen Commonly known as: SEMGLEE Inject 30 Units into the skin at bedtime.   Lantus SoloStar 100 UNIT/ML Solostar Pen Generic drug: insulin glargine Inject 30 Units into the skin at bedtime.   losartan-hydrochlorothiazide 100-25 MG tablet Commonly known as: Hyzaar Take 1 tablet by mouth daily.   metFORMIN 500 MG 24 hr tablet Commonly known as: GLUCOPHAGE-XR Take 1 tablet (500 mg total) by mouth 2 (two) times daily.   methocarbamol  500 MG tablet Commonly known as: ROBAXIN Take 1 tablet (500 mg total) by mouth every 6 (six) hours as needed for muscle spasms.   metoprolol succinate 50 MG 24 hr tablet Commonly known as: Toprol XL Take 1 tablet (50 mg total) by mouth daily. Take with or immediately following a meal.   montelukast 10 MG tablet Commonly known as: Singulair Take 1 tablet (10 mg total) by mouth at bedtime.   predniSONE 20 MG tablet Commonly known as: DELTASONE Take 2 tablets (40 mg total) by mouth daily with breakfast for 1 dose. Start taking on: November 20, 1609   Repatha SureClick 960 MG/ML Soaj Generic drug:  Evolocumab INJECT 1 PEN INTO THE SKIN EVERY 14 DAYS What changed: See the new instructions.   senna-docusate 8.6-50 MG tablet Commonly known as: Senna-Time S Take 1 tablet by mouth at bedtime as needed for mild constipation.   traMADol 50 MG tablet Commonly known as: ULTRAM Take 1 tablet (50 mg total) by mouth every 6 (six) hours as needed. What changed: reasons to take this   traZODone 50 MG tablet Commonly known as: DESYREL Take 1-2 tablets (50-100 mg total) by mouth at bedtime.   Trelegy Ellipta 100-62.5-25 MCG/ACT Aepb Generic drug: Fluticasone-Umeclidin-Vilant INHALE 1 PUFF ONCE DAILY Strength: 100-62.5-25 MCG/ACT What changed:  how much to take how to take this when to take this additional instructions   Trulicity 3 AV/4.0JW Sopn Generic drug: Dulaglutide Inject 3 mg as directed once a week. What changed: additional instructions   Ventolin HFA 108 (90 Base) MCG/ACT inhaler Generic drug: albuterol INHALE 2 PUFFS BY MOUTH EVERY 6 HOURS AS NEEDED FOR WHEEZING FOR SHORTNESS OF BREATH What changed: See the new instructions.   albuterol (2.5 MG/3ML) 0.083% nebulizer solution Commonly known as: PROVENTIL USE 1 VIAL IN NEBULIZER EVERY 4 HOURS AS NEEDED FOR WHEEZING OR SHORTNESS OF BREATH What changed: See the new instructions.        Disposition and follow-up:   [name] was discharged from [facility] in Stable condition.  At the hospital follow up visit please address:  1) E. coli bacteremia/UTI: Follow-up repeat blood cultures which were drawn on 11/19/2022.  Consider blood cultures on 12/03/2022 on follow-up with Dr. Johnnye [name] at [facility].  Patient discharged on cefadroxil.  Ensure patient finishes course of antibiotics.  Follow-up on resolution of symptoms.  2) Cystitis cystica: Patient needs outpatient follow-up with urology for ureteroscopy  3) Acute COPD exacerbation: During hospitalization, patient was started on azithromycin as  well as ceftriaxone.  Patient was also started on steroids.  Ensure patient's respiratory status is back to baseline.  Patient is on Trelegy, and discharged to continue taking Trelegy.  Patient also takes albuterol nebulizers and inhalers as needed.  Patient was discharged on prednisone 40 mg for 1 more day, ensure patient finished steroid dose.   4) Polypharmacy: Consider speaking with patient about medication management strategies, could benefit from pre-packaged pill packs.  2.  Labs / imaging needed at time of follow-up:   > Urology recommended outpatient ureteroscopy to follow up on cystitis cystica   > Consider repeat blood cultures at 12/6 visit with Dr. Johnnye [name] after course of antibiotics  3.  Pending labs/ test needing follow-up: blood cultures drawn 11/19/22  4.  Medication changes  1) Patient discharged on cefadroxil  2) Patient discharged on prednisone 40 mg x 1 day  Follow-up Appointments:  Follow-up Information     [facility] .  Specialty: Emergency Medicine Why: If symptoms worsen Contact information: [address] [phone] [place]        Drucie Opitz, MD. Go to.   Why: your appointment on 11/26/2022 at 3:45 PM Contact information: [place] [address] [phone]                 Hospital Course by problem list: DIAVION LABRADOR is a 61 y.o. person living with a history of aortic stenosis s/p TAVR, T2DM, COPD, asthma, CAD, OSA, HTN, HLD, anxiety/depression and hx of PE who presented with RLQ pain, nausea, vomiting, wheezing, and hypoxia admitted with sepsis due to E. Coli Bacteremia and UTI as well as COPD exacerbation.   Sepsis 2/2 E. Coli Bacteremia & Urinary Tract Infection  Patient admitted with abdominal pain, vomiting, dysuria, and CVA tenderness, met criteria for sepsis (Bcx, fever, tachycardia, mental status change) and was found to  have E. Coli in 2/4 bottles from blood culture and E coli in urine culture, suggesting patient sepsis due to UTI and bacteremia. Etiologies for sepsis include the 31m obstructing stone seen on CT, though it was not found during cystoscopy and may have passed prior to procedure. Other possibilities include recent history of fecal and urinary incontinence. Patient was started on rocephin 2g qhs (first dose was 1g prior to culture results). Sepsis resolved (afebrile, normal mentation, tachycardia) by hospital day. Repeat blood cultures pending on day of discharge. Given patient's TAVR and prosthetic knee, she will continue on extended course of antibiotics, ceftriaxone transitioned to oral cephadroxil 1 gram bid to finish out 14 day course.   Acute COPD Exacerbation  Patient was wheezing, tachypneic, and hypoxic on arrival, and received duoneb x 2. With increased cough and fever as well as hypoxia, her presentation was consistent with an acute COPD exacerbation, treated with ceftriaxone (as per above) as well as azithromycin (5079mx1 and 25069m1 ) and prednisone 93m47m5 days. Respiratory status back at baseline by day of discharge at home O2 requirement. Patient instructed to continue her home inhalers at discharge.  Type 2 Diabetes Mellitus Patient's sugars were elevated on admission, patient started on 1/2 home long acting insulin and novolog SSI 0-15 U qhs and blood sugar remained consistent throughout admission.  Patient will be discharged on home dose of 30 units Lantus daily.  Patient also be discharged on her home dose of metformin.  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 Urology recommended outpatient ureteroscopy.  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 However, patient denied issues with accessibility and medication management to social work during hospitalization.  Discharge Vitals:   BP (!) 148/64 (BP Location: Left Arm)   Pulse 91   Temp 99.7 F (37.6 C) (Oral)   Resp 20   Ht 5' 2.99" (1.6 m)   Wt (!) 137 kg   LMP 02/03/2012   SpO2 94%   BMI 53.51 kg/m   Subjective: Patient feeling much improved this morning. Denies shortness of breath, abdominal pain, dysuria. Says she is urinating without difficulty. Excited to go home for Thanksgiving  Physical Exam:  Constitutional: well-appearing female in no distress CV: Regular rate and rhythm,  no murmurs or gallops Pulm: No increased work of breathing on room air, clear to auscultation bilaterally Abdominal: normoactive bowel sounds, soft, non-distended, non-tender Extremities: Moves all extremities spontaneously Neuro: alert & oriented, no focal deficits  Pertinent Labs, Studies, and Procedures:     Latest Ref Rng & Units 11/19/2022    7:47 AM 11/18/2022    3:57 AM 11/17/2022    3:54 AM  CBC  WBC 4.0 - 10.5 K/uL 7.6  11.0  17.3   Hemoglobin 12.0 - 15.0 g/dL 11.7  10.8  11.7   Hematocrit 36.0 - 46.0 % 36.8  33.4  35.9   Platelets 150 - 400 K/uL 148  148  157       Latest Ref Rng & Units 11/19/2022    7:47 AM 11/18/2022    3:57 AM 11/17/2022    3:54 AM  CMP  Glucose 70 - 99 mg/dL 155  152  171   BUN 8 - 23 mg/dL _0 Creatinine 0.44 - 1.00 mg/dL 0.95  0.92  1.12   Sodium 135 - 145 mmol/L 140  139  141   Potassium 3.5 - 5.1 mmol/L 4.2  3.2  4.0   Chloride 98 - 111 mmol/L 102  98  102   CO2 22 - 32 mmol/L _1 Calcium 8.9 - 10.3 mg/dL 9.1  8.8  8.8    Magnesium: 1.8  Cardiac Panel (last 3 results) Recent Labs    11/16/22 1130  TROPONINIHS <2    Urine Culture 11/19: >=100,000 COLONIES/mL ESCHERICHIA COLI  SUSCEPTIBILITIES TO FOLLOW    Blood  Culture 11/19: 2/4 BOTTLES ESCHERICHIA COLI SUSCEPTIBILITIES TO FOLLOW  CT Chest Abdomen Pelvis IMPRESSION: 1. Obstructing 4 mm right ureteropelvic junction stone with associated mild right hydronephrosis. 2. Punctate nonobstructing left nephrolithiasis. No left hydronephrosis. 3. Subtle mosaic attenuation of the bilateral lungs, likely due to air trapping, which could reflect small airways disease. No pulmonary edema or pleural effusion. 4. Anterior abdominal mesh hernia repair with moderate-sized fat-containing ventral midline abdominal hernias along the superior margin of the hernia mesh repair and two additional fat-containing midline abdominal hernias inferior to the mesh repair, 1 small, 1 moderate. 5. Coronary artery calcifications. Aortic Atherosclerosis (ICD10-I70.0).  DG Retrograde Pyelogram:  IMPRESSION: Fluoroscopic imaging for RIGHT retrograde pyelogram. Well-positioned proximal portion of ureteral stent.   For complete description of intra procedural findings, please see performing service dictation.  Chest Xray IMPRESSION: 1. Asymmetric hazy right lung opacities, slightly more confluent in the right lung base. Findings may reflect asymmetric edema or infection. 2. Questionable layering right pleural effusion.  Discharge Instructions: Dear Ms. Greenberger,   It was a pleasure taking care of you! You were admitted to the hospital with a bacterial infection in your blood and your urine as well as an exacerbation of your COPD and were started on antibiotics and steroids.    You were started on an antibiotic called cephadroxil, please take 2 pills twice a day, starting Wednesday evening (the day you go home) and your last dose will be the evening of December 2nd.   You will take your last steroid pill (prednisone) tomorrow.    Please go to your follow-up appointment with Dr. Sanjuana Mae on 11/26/22 at 3:45 pm at the internal medicine clinic. You also have a follow-up appointment  with Dr. Johnnye [name] at on 12/03/22 at 1:15 pm   Please continue to take all of your other medications and inhalers as  prescribed. Please find information below about care post-ureteroscopy.   Take care!         [facility] [phone] Post Ureteroscopy With or Without Stent Instructions   Definitions:   Ureter: The duct that transports urine from the kidney to the bladder. Stent:   A plastic hollow tube that is placed into the ureter, from the kidney to the bladder to prevent the ureter from swelling shut.   GENERAL INSTRUCTIONS:   Despite the fact that no skin incisions were used, the area around the ureter and bladder is raw and irritated. The stent is a foreign body which will further irritate the bladder wall. This irritation is manifested by increased frequency of urination, both day and night, and by an increase in the urge to urinate. In some, the urge to urinate is present almost always. Sometimes the urge is strong enough that you may not be able to stop yourself from urinating. The only real cure is to remove the stent and then give time for the bladder wall to heal which can't be done until the danger of the ureter swelling shut has passed, which varies.   You may see some blood in your urine while the stent is in place and a few days afterwards. Do not be alarmed, even if the urine was clear for a while. Get off your feet and drink lots of fluids until clearing occurs. If you start to pass clots or don't improve, call us.   DIET: You may return to your normal diet immediately. Because of the raw surface of your bladder, alcohol, spicy foods, acid type foods and drinks with caffeine may cause irritation or frequency and should be used in moderation. To keep your urine flowing freely and to avoid constipation, drink plenty of fluids during the day ( 8-10 glasses ). Tip: Avoid cranberry juice because it is very acidic.   ACTIVITY: Your physical activity doesn't  need to be restricted. However, if you are very active, you may see some blood in your urine. We suggest that you reduce your activity under these circumstances until the bleeding has stopped.   BOWELS: It is important to keep your bowels regular during the postoperative period. Straining with bowel movements can cause bleeding. A bowel movement every other day is reasonable. Use a mild laxative if needed, such as Milk of Magnesia 2-3 tablespoons, or 2 Dulcolax tablets. Call if you continue to have problems. If you have been taking narcotics for pain, before, during or after your surgery, you may be constipated. Take a laxative if necessary.     MEDICATION: You should resume your pre-surgery medications unless told not to. In addition you will often be given an antibiotic to prevent infection and likely several as needed medications for stent related discomfort. These should be taken as prescribed until the bottles are finished unless you are having an unusual reaction to one of the drugs.   PROBLEMS YOU SHOULD REPORT TO [place]: Fevers over 100.5 Fahrenheit. Heavy bleeding, or clots ( See above notes about blood in urine ). Inability to urinate. Drug reactions ( hives, rash, nausea, vomiting, diarrhea ). Severe burning or pain with urination that is not improving.   SignedLeigh Aurora, DO 11/19/2022, 10:20 AM   Pager: [phone]

## 2022-11-21 LAB — CULTURE, BLOOD (ROUTINE X 2): Culture: NO GROWTH

## 2022-11-22 ENCOUNTER — Other Ambulatory Visit: Payer: Self-pay | Admitting: Student

## 2022-11-22 DIAGNOSIS — Z794 Long term (current) use of insulin: Secondary | ICD-10-CM

## 2022-11-22 DIAGNOSIS — E1169 Type 2 diabetes mellitus with other specified complication: Secondary | ICD-10-CM

## 2022-11-24 ENCOUNTER — Telehealth: Payer: Self-pay

## 2022-11-24 LAB — CULTURE, BLOOD (ROUTINE X 2)
Culture: NO GROWTH
Culture: NO GROWTH
Special Requests: ADEQUATE
Special Requests: ADEQUATE

## 2022-11-24 NOTE — Patient Outreach (Signed)
Transition Care Management Follow-up Telephone Call Date of discharge and from where: 11/19/22 [facility] How have you been since you were released from the hospital? Doing good, sort of weak but not so bad, still have a little blood in my urine Any questions or concerns? No  Items Reviewed: Did the pt receive and understand the discharge instructions provided? Yes  Medications obtained and verified? No  Other? No  Any new allergies since your discharge? No  Dietary orders reviewed? No Do you have support at home? Yes    Home Care and Equipment/Supplies: Were home health services ordered? not applicable  If so, what is the name of the agency? Has the agency set up a time to come to the patient's home? not applicable Were any new equipment or medical supplies ordered?  No What is the name of the medical supply agency?  Were you able to get the supplies/equipment? not applicable Do you have any questions related to the use of the equipment or supplies? No  Functional Questionnaire: (I = Independent and D = Dependent) ADLs: I  Bathing/Dressing- I  Meal Prep- I  Eating- I  Maintaining continence- I  Transferring/Ambulation- I  Managing Meds- I  Follow up appointments reviewed:  PCP Hospital f/u appt confirmed? Yes   Scheduled to see Marlene Lard Nooruddin on 11/26/22 @ 3:45. [facility] f/u appt confirmed? Yes  Scheduled to see Bobby Rumpf on 12/03/22 @ 1:15. Are transportation arrangements needed? No  If their condition worsens, is the pt aware to call PCP or go to the Emergency Dept.? Yes Was the patient provided with contact information for the PCP's office or ED? Yes Was to pt encouraged to call back with questions or concerns? Yes SDOH assessment completed, no resources needed at this time. Mickel Fuchs, BSW, [place] Managed Medicaid Team  [phone]

## 2022-11-26 ENCOUNTER — Ambulatory Visit (INDEPENDENT_AMBULATORY_CARE_PROVIDER_SITE_OTHER): Payer: Medicaid Other | Admitting: Student

## 2022-11-26 ENCOUNTER — Encounter: Payer: Self-pay | Admitting: Student

## 2022-11-26 VITALS — BP 129/66 | HR 78 | Wt 303.4 lb

## 2022-11-26 DIAGNOSIS — J449 Chronic obstructive pulmonary disease, unspecified: Secondary | ICD-10-CM

## 2022-11-26 DIAGNOSIS — G9341 Metabolic encephalopathy: Secondary | ICD-10-CM | POA: Diagnosis not present

## 2022-11-26 DIAGNOSIS — R6521 Severe sepsis with septic shock: Secondary | ICD-10-CM

## 2022-11-26 DIAGNOSIS — Z87891 Personal history of nicotine dependence: Secondary | ICD-10-CM

## 2022-11-26 DIAGNOSIS — Z794 Long term (current) use of insulin: Secondary | ICD-10-CM | POA: Diagnosis not present

## 2022-11-26 DIAGNOSIS — E119 Type 2 diabetes mellitus without complications: Secondary | ICD-10-CM

## 2022-11-26 DIAGNOSIS — Z0000 Encounter for general adult medical examination without abnormal findings: Secondary | ICD-10-CM

## 2022-11-26 DIAGNOSIS — A4151 Sepsis due to Escherichia coli [E. coli]: Secondary | ICD-10-CM

## 2022-11-26 LAB — GLUCOSE, CAPILLARY: Glucose-Capillary: 163 mg/dL — ABNORMAL HIGH (ref 70–99)

## 2022-11-26 MED ORDER — TRULICITY 4.5 MG/0.5ML ~~LOC~~ SOAJ: 4.5000 mg | SUBCUTANEOUS | 3 refills | Status: DC

## 2022-11-26 NOTE — Assessment & Plan Note (Signed)
Current regimen is Trulicity '3mg'$ , metformin as well. Last A1c was 7.1. Will increase trulicity to 4.'5mg'$  for further weight control. If trulicity proves ineffective, can consider switching to ozempic or mounjaro.

## 2022-11-26 NOTE — Assessment & Plan Note (Signed)
Pt presents for hospital follow up regarding sepsis due to E. Coli. She states she is feeling much better since her discharge from the hospital. She is having some dysuria, however states it has much improved from initial symptoms. Attempted to get UA today for further evaluation but pt accidentally dropped cup in the toilet. She denies any abdominal pain, recent fevers, chest pain, dizziness.   She has not been contacted by urology for follow up regarding stent placement done in the hospital, as she was diagnosed with cystitis cystica and will need uteroscopy.   Plan: - Gave pt number for urology office and instructed to make an appointment for follow up -  Informed patient that if urinary discomfort gets worse to present back to clinic

## 2022-11-26 NOTE — Patient Instructions (Addendum)
Thank you so much for coming to the clinic today! I have sent your new Trulicity dosage to the pharmacy, remember to stop using the old one when you take your next dose. At your next visit, please bring in all your medications and we will help sort out which ones you need and don't need!   Here is the number for the Urologist: [phone]   If you have any questions please feel free to the call the clinic at anytime at [phone]. It was a pleasure seeing you!  Best, Dr. Sanjuana Mae

## 2022-11-26 NOTE — Progress Notes (Signed)
CC: Hospital follow up   HPI:  [name] is a 61 y.o. female living with a history stated below and presents today for hospital follow up. Please see problem based assessment and plan for additional details.  Past Medical History:  Diagnosis Date   Anemia    Anxiety    Aortic valve stenosis, severe    Arthritis    PAIN AND SEVERE OA LEFT KNEE ; S/P RIGHT TKA ON 02/03/12; HAS LOWER BACK PAIN-UNABLE TO STAND MORE THAN 10 MIN; ARTHRITIS "ALL OVER"   Asthma    Blood transfusion    2013[facility]   Breast cancer in female [facility])    Right   CAD (coronary artery disease)    Cath 2010 with DES x 1 RCA-- PT'S CARDIOLOGIST IS DR. MCALHANY   Chronic diastolic congestive heart failure (HCC)    COPD (chronic obstructive pulmonary disease) (HCC)    Depression    Diabetes mellitus DIAGNOSED IN2010   Dyspnea    with much ambulation   Eczema    on back   Headache    migraines younger- rare now 02/07/21   Heart murmur    no current problems   History of hiatal hernia    History of kidney stones    passed or blasted   Hyperlipidemia    Hypertension    Morbid obesity with body mass index of 60.0-69.9 in adult [facility])    Myocardial infarction ([place])    PT THINKS SHE WAS DX WITH MI AT THE TIME OF HEART STENTING   Neuromuscular disorder (HCC)    bilateral arm/hands   Oxygen dependent    uses 3L oxygen night/prn   Personal history of radiation therapy    Pneumonia    Pulmonary embolism ([place]) 02/08/2012   S/P RT TOTAL KNEE ON 02/03/12--ON 02/08/12--DEVELOPED ACUTE SOB AND CHEST PAIN--AND DIAGNOSED WITH  PULMONARY EMBOLUS AND PNEUMONIA   Restless leg syndrome    Sleep apnea    uses 3 liters O2 at night as needed   Uterine fibroid    NO PROBLEMS AT PRESENT FROM THE FIBROIDS-STATES SHE IS POST MENOPAUSAL-LAST MENSES 2010 EXCEPT FOR EPISODE THIS YR OF BLEEDING RELATED TO FIBROIDS.   Weakness    BOTH HANDS - S/P BILATERAL CARPAL TUNNEL RELEASE--BUT STILL HAS WEAKNESS--OFTEN DROPS THINGS     Current Outpatient Medications on File Prior to Visit  Medication Sig Dispense Refill   Accu-Chek FastClix Lancets MISC Check blood sugar two times a day 102 each 9   albuterol (PROVENTIL) (2.5 MG/3ML) 0.083% nebulizer solution USE 1 VIAL IN NEBULIZER EVERY 4 HOURS AS NEEDED FOR WHEEZING OR SHORTNESS OF BREATH (Patient taking differently: Take 2.5 mg by nebulization every 4 (four) hours as needed for wheezing or shortness of breath.) 150 mL 1   amlodipine-atorvastatin (CADUET) 5-80 MG tablet Take 1 tablet by mouth daily. 90 tablet 3   ASPIRIN ADULT LOW STRENGTH 81 MG EC tablet TAKE 1 Tablet BY MOUTH ONCE DAILY (Patient not taking: Reported on 11/16/2022) 90 tablet 3   B-D UF III MINI PEN NEEDLES 31G X 5 MM MISC USE AS DIRECTED DAILY 100 each 5   Blood Glucose Monitoring Suppl (ACCU-CHEK GUIDE) w/Device KIT 1 each by Does not apply route 2 (two) times daily. 1 kit 1   busPIRone (BUSPAR) 10 MG tablet Take 1 tablet (10 mg total) by mouth 3 (three) times daily. (Patient taking differently: Take 10 mg by mouth 2 (two) times daily.) 90 tablet 3   cefadroxil (  DURICEF) 500 MG capsule Take 2 capsules (1,000 mg total) by mouth 2 (two) times daily for 11 days. 42 capsule 0   citalopram (CELEXA) 20 MG tablet TAKE 1 TABLET BY MOUTH ONCE DAILY. DO NOT TAKE WHILE TAKING DULOXETINE (Patient taking differently: Take 20 mg by mouth daily. TAKE 1 TABLET BY MOUTH ONCE DAILY. DO NOT TAKE WHILE TAKING DULOXETINE) 30 tablet 3   clopidogrel (PLAVIX) 75 MG tablet TAKE 1 TABLET BY MOUTH IN THE MORNING (Patient taking differently: Take 75 mg by mouth daily.) 90 tablet 3   Continuous Blood Gluc Sensor (FREESTYLE LIBRE 3 SENSOR) MISC Place 1 sensor on the skin every 14 days. Use to check glucose continuously 2 each 11   Evolocumab (REPATHA SURECLICK) 194 MG/ML SOAJ INJECT 1 PEN INTO THE SKIN EVERY 14 DAYS (Patient taking differently: Inject 140 mg into the skin every 14 (fourteen) days.) 2 mL 11   exemestane (AROMASIN) 25 MG  tablet Take 1 tablet (25 mg total) by mouth daily after breakfast. 90 tablet 3   ezetimibe (ZETIA) 10 MG tablet Take 1 tablet (10 mg total) by mouth daily. 90 tablet 3   ferrous sulfate 325 (65 FE) MG tablet Take 1 tablet (325 mg total) by mouth every other day. 30 tablet 3   fexofenadine (ALLEGRA) 180 MG tablet Take 1 tablet (180 mg total) by mouth daily. 90 tablet 1   fluticasone (FLONASE) 50 MCG/ACT nasal spray Place 1 spray into both nostrils daily. Begin by using 2 sprays in each nare daily for 3 to 5 days, then decrease to 1 spray in each nare daily. (Patient not taking: Reported on 11/16/2022) 32 mL 1   Fluticasone-Umeclidin-Vilant (TRELEGY ELLIPTA) 100-62.5-25 MCG/ACT AEPB INHALE 1 PUFF ONCE DAILY Strength: 100-62.5-25 MCG/ACT (Patient taking differently: Inhale 1 puff into the lungs 2 (two) times daily.) 180 each 5   furosemide (LASIX) 40 MG tablet Take 1 tablet (40 mg total) by mouth as needed for fluid or edema. (Patient taking differently: Take 40 mg by mouth every other day.) 180 tablet 1   gabapentin (NEURONTIN) 300 MG capsule TAKE 2 CAPSULES BY MOUTH IN THE MORNING AND TAKE 3 CAPSULES AT BEDTIME (Patient taking differently: Take 300 mg by mouth See admin instructions. Take 614m by mouth in the morning and take 9036mby mouth at bedtime.) 150 capsule 0   glucose blood (ACCU-CHEK GUIDE) test strip Check blood sugar 2 times per day 100 each 9   insulin glargine-yfgn (SEMGLEE) 100 UNIT/ML Pen Inject 30 Units into the skin at bedtime. (Patient not taking: Reported on 11/16/2022) 15 mL 2   LANTUS SOLOSTAR 100 UNIT/ML Solostar Pen Inject 30 Units into the skin at bedtime.     losartan-hydrochlorothiazide (HYZAAR) 100-25 MG tablet Take 1 tablet by mouth daily. 90 tablet 3   metFORMIN (GLUCOPHAGE-XR) 500 MG 24 hr tablet Take 1 tablet (500 mg total) by mouth 2 (two) times daily. 90 tablet 2   methocarbamol (ROBAXIN) 500 MG tablet Take 1 tablet (500 mg total) by mouth every 6 (six) hours as needed  for muscle spasms. 30 tablet 0   metoprolol succinate (TOPROL XL) 50 MG 24 hr tablet Take 1 tablet (50 mg total) by mouth daily. Take with or immediately following a meal. 90 tablet 3   montelukast (SINGULAIR) 10 MG tablet Take 1 tablet (10 mg total) by mouth at bedtime. 90 tablet 2   senna-docusate (SENNA-TIME S) 8.6-50 MG tablet Take 1 tablet by mouth at bedtime as needed for mild constipation.  30 tablet 1   traMADol (ULTRAM) 50 MG tablet Take 1 tablet (50 mg total) by mouth every 6 (six) hours as needed. (Patient taking differently: Take 50 mg by mouth every 6 (six) hours as needed for moderate pain or severe pain.) 15 tablet 0   traZODone (DESYREL) 50 MG tablet Take 1-2 tablets (50-100 mg total) by mouth at bedtime. 60 tablet 3   VENTOLIN HFA 108 (90 Base) MCG/ACT inhaler INHALE 2 PUFFS BY MOUTH EVERY 6 HOURS AS NEEDED FOR WHEEZING FOR SHORTNESS OF BREATH (Patient taking differently: Inhale 2 puffs into the lungs every 6 (six) hours as needed for wheezing or shortness of breath.) 18 g 3   [DISCONTINUED] Fluticasone-Salmeterol (ADVAIR DISKUS) 100-50 MCG/DOSE AEPB Inhale 1 puff into the lungs daily. 360 each 0   No current facility-administered medications on file prior to visit.    Family History  Problem Relation Age of Onset   Breast cancer Mother        stage IV at diagnosis   Emphysema Mother        smoked   Heart disease Mother    COPD Father        smoked   Asthma Father    Heart disease Father    Cancer Brother        Sinus    Social History   Socioeconomic History   Marital status: Married    Spouse name: Not on file   Number of children: 2   Years of education: Not on file   Highest education level: Not on file  Occupational History   Occupation: Disabled  Tobacco Use   Smoking status: Former    Packs/day: 1.50    Years: 30.00    Total pack years: 45.00    Types: Cigarettes    Quit date: 12/29/2000    Years since quitting: 21.9   Smokeless tobacco: Never   Vaping Use   Vaping Use: Never used  Substance and Sexual Activity   Alcohol use: Not Currently   Drug use: Not Currently    Types: Marijuana    Comment: CBD oils through vape shops   Sexual activity: Not Currently    Birth control/protection: Surgical, Post-menopausal    Comment: tubal ligation  Other Topics Concern   Not on file  Social History Narrative   Not on file   Social Determinants of Health   Financial Resource Strain: High Risk (06/24/2022)   Overall Financial Resource Strain (CARDIA)    Difficulty of Paying Living Expenses: Hard  Food Insecurity: No Food Insecurity (11/16/2022)   Hunger Vital Sign    Worried About Running Out of Food in the Last Year: Never true    Ran Out of Food in the Last Year: Never true  Transportation Needs: No Transportation Needs (11/16/2022)   PRAPARE - Hydrologist (Medical): No    Lack of Transportation (Non-Medical): No  Physical Activity: Inactive (11/07/2022)   Exercise Vital Sign    Days of Exercise per Week: 0 days    Minutes of Exercise per Session: 0 min  Stress: Stress Concern Present (11/07/2022)   [place]    Feeling of Stress : To some extent  Social Connections: Moderately Isolated (11/07/2022)   Social Connection and Isolation Panel [NHANES]    Frequency of Communication with Friends and Family: More than three times a week    Frequency of Social Gatherings with Friends and Family: More  than three times a week    Attends Religious Services: Never    Active Member of Clubs or Organizations: No    Attends Archivist Meetings: Never    Marital Status: Married  Human resources officer Violence: Not At Risk (11/16/2022)   Humiliation, Afraid, Rape, and Kick questionnaire    Fear of Current or Ex-Partner: No    Emotionally Abused: No    Physically Abused: No    Sexually Abused: No    Review of Systems: ROS negative  except for what is noted on the assessment and plan.  Vitals:   11/26/22 1604  BP: 129/66  Pulse: 78  SpO2: 98%  Weight: (!) 303 lb 6.4 oz (137.6 kg)    Physical Exam: Constitutional: Obese woman, in no acute distress  Cardiovascular: regular rate and rhythm, no m/r/g Pulmonary/Chest: normal work of breathing on room air, lungs clear to auscultation bilaterally Abdominal: soft, non-tender, non-distended Skin: warm and dry   Assessment & Plan:   Sepsis due to Escherichia coli with encephalopathy and septic shock ([place]) Pt presents for hospital follow up regarding sepsis due to E. Coli. She states she is feeling much better since her discharge from the hospital. She is having some dysuria, however states it has much improved from initial symptoms. Attempted to get UA today for further evaluation but pt accidentally dropped cup in the toilet. She denies any abdominal pain, recent fevers, chest pain, dizziness.   She has not been contacted by urology for follow up regarding stent placement done in the hospital, as she was diagnosed with cystitis cystica and will need uteroscopy.   Plan: - Gave pt number for urology office and instructed to make an appointment for follow up -  Informed patient that if urinary discomfort gets worse to present back to clinic  COPD (chronic obstructive pulmonary disease) Pt takes Trelegy 146mg inhaler daily. However, over the past week she has had to use rescue albuterol four times. She has seen a pulmonologist back in 2014 and was concerned for obstructive sleep apnea, but testing was negative. Respiratory distress could be secondary to deconditioning due to patient's weight, and was believed to be the reason in 2014 as well.   Plan:  - Encouraged diet and exercise  - Continue to monitor  Type 2 diabetes mellitus without complication, without long-term current use of insulin (HCC) Current regimen is Trulicity 341m metformin as well. Last A1c was 7.1.  Will increase trulicity to 4.4.0HKor further weight control. If trulicity proves ineffective, can consider switching to ozempic or mounjaro.   Healthcare maintenance Pt takes quite a few medications chronically due to multiple co-morbidities. She does not have a pill organizer, and keeps everything in one box. Encouraged patient at next visit to bring in medications to help sort out.   Patient seen with Dr. E.Thomasene RippleM.D. [facility], PGY-1 Phone: [phone] 11/26/2022 Time 6:11 PM

## 2022-11-26 NOTE — Assessment & Plan Note (Signed)
Pt takes quite a few medications chronically due to multiple co-morbidities. She does not have a pill organizer, and keeps everything in one box. Encouraged patient at next visit to bring in medications to help sort out.

## 2022-11-26 NOTE — Assessment & Plan Note (Signed)
Pt takes Trelegy 159mg inhaler daily. However, over the past week she has had to use rescue albuterol four times. She has seen a pulmonologist back in 2014 and was concerned for obstructive sleep apnea, but testing was negative. Respiratory distress could be secondary to deconditioning due to patient's weight, and was believed to be the reason in 2014 as well.   Plan:  - Encouraged diet and exercise  - Continue to monitor

## 2022-11-27 NOTE — Addendum Note (Signed)
Addended by: Drucie Opitz on: 11/27/2022 11:51 AM   Modules accepted: Orders

## 2022-11-28 DIAGNOSIS — Z419 Encounter for procedure for purposes other than remedying health state, unspecified: Secondary | ICD-10-CM | POA: Diagnosis not present

## 2022-11-29 ENCOUNTER — Other Ambulatory Visit: Payer: Self-pay | Admitting: Student

## 2022-11-29 DIAGNOSIS — E1142 Type 2 diabetes mellitus with diabetic polyneuropathy: Secondary | ICD-10-CM

## 2022-12-01 NOTE — Progress Notes (Signed)
Internal Medicine Clinic Attending  I saw and evaluated the patient.  I personally confirmed the key portions of the history and exam documented by the resident  and I reviewed pertinent patient test results.  The assessment, diagnosis, and plan were formulated together and I agree with the documentation in the resident's note.  

## 2022-12-03 ENCOUNTER — Encounter: Payer: Self-pay | Admitting: Infectious Diseases

## 2022-12-03 ENCOUNTER — Ambulatory Visit (INDEPENDENT_AMBULATORY_CARE_PROVIDER_SITE_OTHER): Payer: Medicaid Other | Admitting: Infectious Diseases

## 2022-12-03 VITALS — BP 132/68 | HR 82 | Temp 98.1°F | Wt 305.2 lb

## 2022-12-03 DIAGNOSIS — G9341 Metabolic encephalopathy: Secondary | ICD-10-CM | POA: Diagnosis not present

## 2022-12-03 DIAGNOSIS — R6521 Severe sepsis with septic shock: Secondary | ICD-10-CM

## 2022-12-03 DIAGNOSIS — Z794 Long term (current) use of insulin: Secondary | ICD-10-CM | POA: Diagnosis not present

## 2022-12-03 DIAGNOSIS — A4151 Sepsis due to Escherichia coli [E. coli]: Secondary | ICD-10-CM | POA: Diagnosis not present

## 2022-12-03 DIAGNOSIS — E119 Type 2 diabetes mellitus without complications: Secondary | ICD-10-CM | POA: Diagnosis not present

## 2022-12-03 NOTE — Progress Notes (Signed)
   Subjective:    Patient ID: [name], female  DOB: [birth year], 61 y.o.        MRN: [id number]   HPI 61 yo F with hx of aortic stenosis s/p TAVR, T2DM, COPD, asthma, CAD, OSA, HTN, HLD, anxiety/depression and hx of PE. She was adm to [facility] 11-19 to 11-19-22 with E coli bacteremia (pan-sens). She had an obstructing stone and underwent cystoscopy, ureteral stent placement on day of admission.  Her repeat BCx sent on 11-22 were (-). She was d/c home on cefadroxil after being treated with Ceftriaxone in hospital.  She completed these on 12-02-22.  ESR  74 in hospital. She complains of knee pain only from injury from running into cash register.  No f/c.  No problems with passing urine, although having intermittent hematuria. Worsens as day progresses.  Has been having occas back pain, soreness. No abd pain.  Has urology f/u tomorrow- she thinks she may be having another surgery (stone extraction? Stent change?). Knee replacements 2013 per pt? TAVR 2018/19 per pt.   Health Maintenance  Topic Date Due  . Zoster Vaccines- Shingrix (1 of 2) Never done  . COLONOSCOPY (Pts 45-12yr Insurance coverage will need to be confirmed)  Never done  . COLON CANCER SCREENING ANNUAL FOBT  03/16/2019  . COVID-19 Vaccine (3 - Pfizer risk series) 05/29/2020  . OPHTHALMOLOGY EXAM  05/08/2022  . Diabetic kidney evaluation - Urine ACR  11/14/2022  . FOOT EXAM  11/14/2022  . HEMOGLOBIN A1C  02/26/2023  . Diabetic kidney evaluation - GFR measurement  11/20/2023  . MAMMOGRAM  09/18/2024  . PAP SMEAR-Modifier  08/27/2025  . DTaP/Tdap/Td (3 - Td or Tdap) 10/21/2031  . INFLUENZA VACCINE  Completed  . Hepatitis C Screening  Completed  . HIV Screening  Completed  . HPV VACCINES  Aged Out      Review of Systems  Constitutional:  Negative for chills and fever.  Respiratory:  Positive for cough. Negative for shortness of breath.   Gastrointestinal:  Negative for abdominal pain, constipation and  diarrhea.  Genitourinary:  Positive for hematuria. Negative for dysuria, flank pain and urgency.    Please see HPI. All other systems reviewed and negative.     Objective:  Physical Exam Constitutional:      Appearance: Normal appearance.  HENT:     Mouth/Throat:     Mouth: Mucous membranes are moist.  Cardiovascular:     Rate and Rhythm: Normal rate and regular rhythm.  Pulmonary:     Effort: Pulmonary effort is normal.     Breath sounds: Normal breath sounds.  Abdominal:     General: Abdomen is flat. There is no distension.  Musculoskeletal:        General: Normal range of motion.     Cervical back: Normal range of motion and neck supple.     Right lower leg: No edema.     Left lower leg: No edema.       Legs:     Comments: She has no tenderness or swelling of her knees. No heat.   Neurological:     General: No focal deficit present.     Mental Status: She is alert.           Assessment & Plan:

## 2022-12-03 NOTE — Assessment & Plan Note (Signed)
She is doing well Will recheck her ESR (to assess for possible joint involvement though clinically clean) and her BCx to assess for clearance.  Has uro f/u Will call her with results.

## 2022-12-03 NOTE — Assessment & Plan Note (Signed)
States her FSG have been running normal.  I encouraged her to get RSV vax She has had flu shot.  Has not had COVID vax this fall. Yet.

## 2022-12-04 ENCOUNTER — Ambulatory Visit: Payer: Medicaid Other | Admitting: Urology

## 2022-12-04 LAB — SEDIMENTATION RATE: Sed Rate: 37 mm/hr (ref 0–40)

## 2022-12-04 NOTE — Progress Notes (Deleted)
Assessment: 1. Nephrolithiasis   2. History of UTI      Plan: ***  Chief Complaint: No chief complaint on file.   History of Present Illness:  [name] is a 61 y.o. female who is seen for evaluation of right proximal ureteral stone and UTI.  She was seen by Dr. Cain Sieve at [facility] on 11/16/2022 with a proximal right ureteral calculus and UTI with sepsis.  CT imaging from 11/16/2022 showed an obstructing 4 mm calculus at the right UPJ with associated hydronephrosis and punctate nonobstructing left nephrolithiasis.  She underwent cystoscopy with placement of a right ureteral stent on 11/16/2022.  Urine culture grew >100 K E. coli.  Blood cultures were also positive for E. coli.  She was treated with IV antibiotics and discharged on [place].   Past Medical History:  Past Medical History:  Diagnosis Date   Anemia    Anxiety    Aortic valve stenosis, severe    Arthritis    PAIN AND SEVERE OA LEFT KNEE ; S/P RIGHT TKA ON 02/03/12; HAS LOWER BACK PAIN-UNABLE TO STAND MORE THAN 10 MIN; ARTHRITIS "ALL OVER"   Asthma    Blood transfusion    2013[facility]   Breast cancer in female [facility])    Right   CAD (coronary artery disease)    Cath 2010 with DES x 1 RCA-- PT'S CARDIOLOGIST IS DR. MCALHANY   Chronic diastolic congestive heart failure (HCC)    COPD (chronic obstructive pulmonary disease) (HCC)    Depression    Diabetes mellitus DIAGNOSED IN2010   Dyspnea    with much ambulation   Eczema    on back   Headache    migraines younger- rare now 02/07/21   Heart murmur    no current problems   History of hiatal hernia    History of kidney stones    passed or blasted   Hyperlipidemia    Hypertension    Morbid obesity with body mass index of 60.0-69.9 in adult [facility])    Myocardial infarction ([place])    PT THINKS SHE WAS DX WITH MI AT THE TIME OF HEART STENTING   Neuromuscular disorder (HCC)    bilateral arm/hands   Oxygen dependent    uses 3L oxygen night/prn   Personal  history of radiation therapy    Pneumonia    Pulmonary embolism ([place]) 02/08/2012   S/P RT TOTAL KNEE ON 02/03/12--ON 02/08/12--DEVELOPED ACUTE SOB AND CHEST PAIN--AND DIAGNOSED WITH  PULMONARY EMBOLUS AND PNEUMONIA   Restless leg syndrome    Sleep apnea    uses 3 liters O2 at night as needed   Uterine fibroid    NO PROBLEMS AT PRESENT FROM THE FIBROIDS-STATES SHE IS POST MENOPAUSAL-LAST MENSES 2010 EXCEPT FOR EPISODE THIS YR OF BLEEDING RELATED TO FIBROIDS.   Weakness    BOTH HANDS - S/P BILATERAL CARPAL TUNNEL RELEASE--BUT STILL HAS WEAKNESS--OFTEN DROPS THINGS    Past Surgical History:  Past Surgical History:  Procedure Laterality Date   BACK SURGERY  02/11/2021   Dr Louanne Skye   BREAST BIOPSY Right 06/04/2018   BREAST LUMPECTOMY Right 06/2018   BREAST LUMPECTOMY WITH RADIOACTIVE SEED AND SENTINEL LYMPH NODE BIOPSY Right 07/19/2018   Procedure: BREAST LUMPECTOMY WITH RADIOACTIVE SEED AND SENTINEL LYMPH NODE BIOPSY;  Surgeon: Alphonsa Overall, MD;  Location: [place];  Service: General;  Laterality: Right;   CARDIAC CATHETERIZATION     CARDIAC VALVE REPLACEMENT     2017   CARPAL TUNNEL RELEASE  Bilateral   CHOLECYSTECTOMY     COLONOSCOPY     CORONARY ANGIOPLASTY     2010 has stent in place   CYSTOSCOPY W/ RETROGRADES Right 09/21/2013   Procedure: CYSTOSCOPY WITH RIGHT RETROGRADE PYELOGRAM RIGHT DOUBLE J STENT ;  Surgeon: Fredricka Bonine, MD;  [facility];  Service: Urology;  Laterality: Right;   CYSTOSCOPY W/ URETERAL STENT PLACEMENT Right 11/16/2022   Procedure: CYSTOSCOPY WITH RETROGRADE PYELOGRAM/URETERAL STENT PLACEMENT;  Surgeon: Vira Agar, MD;  Location: [place];  Service: Urology;  Laterality: Right;   CYSTOSCOPY WITH URETEROSCOPY AND STENT PLACEMENT Right 10/25/2013   Procedure: CYSTOSCOPY RIGHT URETEROSCOPY HOLMIUM LASER LITHO AND STENT PLACEMENT;  Surgeon: Fredricka Bonine, MD;  [facility];  Service: Urology;  Laterality: Right;   HERNIA REPAIR      INTRAOPERATIVE TRANSESOPHAGEAL ECHOCARDIOGRAM N/A 12/12/2014   Procedure: INTRAOPERATIVE TRANSESOPHAGEAL ECHOCARDIOGRAM;  Surgeon: Burnell Blanks, MD;  Location: [place];  Service: Cardiovascular;  Laterality: N/A;   JOINT REPLACEMENT     bil total knees   KNEE ARTHROPLASTY  02/03/2012   Procedure: COMPUTER ASSISTED TOTAL KNEE ARTHROPLASTY;  Surgeon: Mcarthur Rossetti, MD;  Location: [place];  Service: Orthopedics;  Laterality: Right;  Right total knee arthroplasty   LEFT AND RIGHT HEART CATHETERIZATION WITH CORONARY ANGIOGRAM N/A 03/17/2013   Procedure: LEFT AND RIGHT HEART CATHETERIZATION WITH CORONARY ANGIOGRAM;  Surgeon: Burnell Blanks, MD;  Location: [facility] CATH LAB;  Service: Cardiovascular;  Laterality: N/A;   LEFT AND RIGHT HEART CATHETERIZATION WITH CORONARY/GRAFT ANGIOGRAM N/A 09/14/2014   Procedure: LEFT AND RIGHT HEART CATHETERIZATION WITH Beatrix Fetters;  Surgeon: Burnell Blanks, MD;  Location: [facility] CATH LAB;  Service: Cardiovascular;  Laterality: N/A;   REVERSE SHOULDER ARTHROPLASTY Left 03/06/2022   Procedure: LEFT SHOULDER REPLACEMENT APPLICATION OF WOUND VAC;  Surgeon: [name], MD;  Location: [place];  Service: Orthopedics;  Laterality: Left;   TEE WITHOUT CARDIOVERSION N/A 03/14/2013   Procedure: TRANSESOPHAGEAL ECHOCARDIOGRAM (TEE);  Surgeon: Lelon Perla, MD;  Location: [facility] ENDOSCOPY;  Service: Cardiovascular;  Laterality: N/A;   TEE WITHOUT CARDIOVERSION N/A 11/14/2014   Procedure: TRANSESOPHAGEAL ECHOCARDIOGRAM (TEE);  Surgeon: Lelon Perla, MD;  Location: [facility] ENDOSCOPY;  Service: Cardiovascular;  Laterality: N/A;   TONSILLECTOMY     maybe as a child- does not know   TOTAL KNEE ARTHROPLASTY  09/10/2012   Procedure: TOTAL KNEE ARTHROPLASTY;  Surgeon: Mcarthur Rossetti, MD;  [facility];  Service: Orthopedics;  Laterality: Left;   TOTAL KNEE REVISION Right 07/15/2013   Procedure: REVISION ARTHROPLASTY RIGHT KNEE;  Surgeon:  Mcarthur Rossetti, MD;  [facility];  Service: Orthopedics;  Laterality: Right;   TRANSCATHETER AORTIC VALVE REPLACEMENT, TRANSFEMORAL N/A 12/12/2014   Procedure: TRANSCATHETER AORTIC VALVE REPLACEMENT, TRANSFEMORAL;  Surgeon: Burnell Blanks, MD;  Location: [place];  Service: Cardiovascular;  Laterality: N/A;   TRIGGER FINGER RELEASE  09/10/2012   Procedure: RELEASE TRIGGER FINGER/A-1 PULLEY;  Surgeon: Mcarthur Rossetti, MD;  [facility];  Service: Orthopedics;  Laterality: Right;  Right Ring Finger   TUBAL LIGATION      Allergies:  No Known Allergies  Family History:  Family History  Problem Relation Age of Onset   Breast cancer Mother        stage IV at diagnosis   Emphysema Mother        smoked   Heart disease Mother    COPD Father        smoked  Asthma Father    Heart disease Father    Cancer Brother        Sinus    Social History:  Social History   Tobacco Use   Smoking status: Former    Packs/day: 1.50    Years: 30.00    Total pack years: 45.00    Types: Cigarettes    Quit date: 12/29/2000    Years since quitting: 21.9   Smokeless tobacco: Never  Vaping Use   Vaping Use: Never used  Substance Use Topics   Alcohol use: Not Currently   Drug use: Not Currently    Types: Marijuana    Comment: CBD oils through vape shops    Review of symptoms:  Constitutional:  Negative for unexplained weight loss, night sweats, fever, chills ENT:  Negative for nose bleeds, sinus pain, painful swallowing CV:  Negative for chest pain, shortness of breath, exercise intolerance, palpitations, loss of consciousness Resp:  Negative for cough, wheezing, shortness of breath GI:  Negative for nausea, vomiting, diarrhea, bloody stools GU:  Positives noted in HPI; otherwise negative for gross hematuria, dysuria, urinary incontinence Neuro:  Negative for seizures, poor balance, limb weakness, slurred speech Psych:  Negative for lack of energy, depression,  anxiety Endocrine:  Negative for polydipsia, polyuria, symptoms of hypoglycemia (dizziness, hunger, sweating) Hematologic:  Negative for anemia, purpura, petechia, prolonged or excessive bleeding, use of anticoagulants  Allergic:  Negative for difficulty breathing or choking as a result of exposure to anything; no shellfish allergy; no allergic response (rash/itch) to materials, foods  Physical exam: LMP 02/03/2012  GENERAL APPEARANCE:  Well appearing, well developed, well nourished, NAD HEENT: Atraumatic, Normocephalic, oropharynx clear. NECK: Supple without lymphadenopathy or thyromegaly. LUNGS: Clear to auscultation bilaterally. HEART: Regular Rate and Rhythm without murmurs, gallops, or rubs. ABDOMEN: Soft, non-tender, No Masses. EXTREMITIES: Moves all extremities well.  Without clubbing, cyanosis, or edema. NEUROLOGIC:  Alert and oriented x 3, normal gait, CN II-XII grossly intact.  MENTAL STATUS:  Appropriate. BACK:  Non-tender to palpation.  No CVAT SKIN:  Warm, dry and intact.    Results: Dipstick U/A:

## 2022-12-05 ENCOUNTER — Ambulatory Visit (INDEPENDENT_AMBULATORY_CARE_PROVIDER_SITE_OTHER): Payer: Medicaid Other | Admitting: Urology

## 2022-12-05 ENCOUNTER — Encounter: Payer: Self-pay | Admitting: Urology

## 2022-12-05 ENCOUNTER — Telehealth: Payer: Self-pay

## 2022-12-05 ENCOUNTER — Other Ambulatory Visit: Payer: Self-pay | Admitting: Urology

## 2022-12-05 ENCOUNTER — Telehealth: Payer: Self-pay | Admitting: *Deleted

## 2022-12-05 VITALS — BP 160/71 | HR 88 | Ht 63.0 in | Wt 303.0 lb

## 2022-12-05 DIAGNOSIS — R8290 Unspecified abnormal findings in urine: Secondary | ICD-10-CM

## 2022-12-05 DIAGNOSIS — Z87440 Personal history of urinary (tract) infections: Secondary | ICD-10-CM | POA: Diagnosis not present

## 2022-12-05 DIAGNOSIS — N200 Calculus of kidney: Secondary | ICD-10-CM

## 2022-12-05 HISTORY — DX: Personal history of urinary (tract) infections: Z87.440

## 2022-12-05 LAB — URINALYSIS
Bilirubin, UA: NEGATIVE
Glucose, UA: 100 mg/dL — AB
Ketones, UA: NEGATIVE
Nitrite, UA: POSITIVE
Protein, UA: POSITIVE — AB
Spec Grav, UA: 1.015 (ref 1.010–1.025)
Urobilinogen, UA: NORMAL
pH, UA: 7 (ref 5.0–8.0)

## 2022-12-05 MED ORDER — CEFDINIR 300 MG PO CAPS: 300.0000 mg | ORAL_CAPSULE | Freq: Two times a day (BID) | ORAL | 0 refills | Status: AC

## 2022-12-05 NOTE — Telephone Encounter (Signed)
Clearance sent

## 2022-12-05 NOTE — Telephone Encounter (Signed)
   Pre-operative Risk Assessment    Patient Name: [name]  DOB: 13-Feb-1961 MRN: [id number]      Request for Surgical Clearance    Procedure:   CYSTOSCOPY WITH RIGHT RETROGRADE PYELOGRAM RIGHT URETEROSCOPY WITH LASER RIGHT URETERAL STENT EXCHANGE    Date of Surgery:  Clearance 01/13/23                                 Surgeon:  DR. Michaelle Birks Surgeon's Group or Practice Name:  [facility]  Phone number:  [phone] Fax number:  [phone]   Type of Clearance Requested:   - Medical  - Pharmacy:  Hold Clopidogrel (Plavix) x 5 DAYS PRIOR TO PROCEDURE   Type of Anesthesia:  General    Additional requests/questions:    Jiles Prows   12/05/2022, 3:31 PM

## 2022-12-05 NOTE — Progress Notes (Unsigned)
Surgical Physician Order [facility]  * Scheduling expectation :  1st available surgery date in January 2024  *Length of Case: 60 minutes  *MD Preforming Case: Michaelle Birks, MD  *Assistant Needed: no  *Facility Preference: [facility]  *Clearance needed: yes  *Anticoagulation Instructions:  Hold Plavix for 5 days preop  *Aspirin Instructions: Ok to continue Aspirin  -Admit type: OUTpatient  -Anesthesia: General  -Use Standing Orders:  none  *Diagnosis: Right Nephrolithiasis  *Procedure:  cystoscopy, right retrograde pyelogram, right ureteroscopic laser lithotripsy, exchange of right ureteral stent    Additional orders: N/A  -Equipment:  Digital Ureteroscopy, Holmium laser, C-arm  -VTE Prophylaxis Standing Order SCD's       Other: NEED TO HOLD TRULICITY FOR 1 WEEK PRIOR TO SURGERY  -Standing Lab Orders Per Anesthesia    Lab other: BMET  -Standing Test orders EKG/Chest x-ray per Anesthesia       Test other:   - Medications:  Ceftriaxone(Rocephin) 1gm IV  -Other orders:  SCDs  *Post-op visit Date/Instructions:  1 week cysto stent removal

## 2022-12-05 NOTE — Progress Notes (Signed)
I spoke with [name] . We have discussed possible surgery dates and 01/13/2023 was agreed upon by all parties. Patient given information about surgery date, what to expect pre-operatively and post operatively.    We discussed that a pre-op nurse will be calling to set up the pre-op visit that will take place prior to surgery. Informed patient that our office will communicate any additional care to be provided after surgery.    Patients questions or concerns were discussed during our call. Advised to call our office should there be any additional information, questions or concerns that arise. Patient verbalized understanding.    Education given to patient on holding Trulicity 7 days prior. Patient aware she can take 01/03/2023 but not 01/10/2023 dose. Pending clearance for Plavix hold

## 2022-12-05 NOTE — Progress Notes (Signed)
Assessment: 1. Nephrolithiasis   2. History of UTI   3. Abnormal urine findings     Plan: I reviewed the patient records from her recent hospitalization including provider notes, operative notes, lab results, and imaging results. I personally viewed the CT study from 11/16/2022 showing a proximal right ureteral calculus with obstruction. Urine culture sent today Begin Cefdinir 300 mg BID x 7 days.  Rx sent. I discussed the need for definitive management of the right ureteral calculus.  Given her body habitus and the stone size, I would agree that ureteroscopic laser lithotripsy would be the best option for her. I advised her that ideally I would recommend holding the Plavix for approximately 5 days preoperatively.  I also advised her that she would need to hold the Trulicity for 1 week prior to surgery.  We will need to appropriately treat any UTI prior to surgical management.  She would likely benefit from being on a daily antibiotic for UTI prevention prior to surgery.  Risks, benefits, alternatives were discussed with the patient in detail.  Potential risks including, but not limited to, infection; bleeding;  injury to urethra, bladder, or ureter; possible need of other treatments; possible failure to remove the calculus; ureteral  stricture formation; cardiac, pulmonary, cerebrovascular events; and anesthetic complications were discussed.  The patient understands and wishes to proceed.   The patient will be scheduled for cystoscopy, right retrograde pyelogram, right ureteroscopic laser lithotripsy, exchange of right ureteral stent at [name].  Surgical request is placed with the surgery schedulers and will be scheduled at the patient's/family request. Informed consent is given as documented below. Anesthesia: General  The patient does have sleep apnea, history of MRSA, history of VRE, history of cardiac device requiring special anesthetic needs. Patient is stable and considered  clear for surgical in an outpatient ambulatory surgery setting as well as patient hospital setting.  Consent for Operation or Procedure: Provider Certification I hereby certify that the nature, purpose, benefits, usual and most frequent risks of, and alternatives to, the operation or procedure have been explained to the patient (or person authorized to sign for the patient) either by me as responsible physician or by the provider who is to perform the operation or procedure. Time spent such that the patient/family has had an opportunity to ask questions, and that those questions have been answered. The patient or the patient's representative has been advised that selected tasks may be performed by assistants to the primary health care provider(s). I believe that the patient (or person authorized to sign for the patient) understands what has been explained, and has consented to the operation or procedure. No guarantees were implied or made.   Chief Complaint:  Chief Complaint  Patient presents with   Nephrolithiasis    History of Present Illness:  [name] is a 61 y.o. female who is seen for evaluation of right proximal ureteral stone and UTI.  She was seen by Dr. Cain [name] at [name] on 11/16/2022 with a proximal right ureteral calculus and UTI with sepsis.  CT imaging from 11/16/2022 showed an obstructing 4 mm calculus at the right UPJ with associated hydronephrosis and punctate nonobstructing left nephrolithiasis.  She underwent cystoscopy with placement of a right ureteral stent on 11/16/2022.  Urine culture grew >100 K E. coli.  Blood cultures were also positive for E. coli.  She was treated with IV antibiotics and discharged on [place]. She is tolerating the stent well with mild stent related symptoms.  She  has completed her antibiotics.  She is having occasional gross hematuria.  No fevers or chills.  No flank pain.   Past Medical History:  Past Medical History:  Diagnosis Date    Anemia    Anxiety    Aortic valve stenosis, severe    Arthritis    PAIN AND SEVERE OA LEFT KNEE ; S/P RIGHT TKA ON 02/03/12; HAS LOWER BACK PAIN-UNABLE TO STAND MORE THAN 10 MIN; ARTHRITIS "ALL OVER"   Asthma    Blood transfusion    2013[name]   Breast cancer in female [name])    Right   CAD (coronary artery disease)    Cath 2010 with DES x 1 RCA-- PT'S CARDIOLOGIST IS DR. MCALHANY   Chronic diastolic congestive heart failure (HCC)    COPD (chronic obstructive pulmonary disease) (HCC)    Depression    Diabetes mellitus DIAGNOSED IN2010   Dyspnea    with much ambulation   Eczema    on back   Headache    migraines younger- rare now 02/07/21   Heart murmur    no current problems   History of hiatal hernia    History of kidney stones    passed or blasted   Hyperlipidemia    Hypertension    Morbid obesity with body mass index of 60.0-69.9 in adult [name])    Myocardial infarction ([place])    PT THINKS SHE WAS DX WITH MI AT THE TIME OF HEART STENTING   Neuromuscular disorder (HCC)    bilateral arm/hands   Oxygen dependent    uses 3L oxygen night/prn   Personal history of radiation therapy    Pneumonia    Pulmonary embolism ([place]) 02/08/2012   S/P RT TOTAL KNEE ON 02/03/12--ON 02/08/12--DEVELOPED ACUTE SOB AND CHEST PAIN--AND DIAGNOSED WITH  PULMONARY EMBOLUS AND PNEUMONIA   Restless leg syndrome    Sleep apnea    uses 3 liters O2 at night as needed   Uterine fibroid    NO PROBLEMS AT PRESENT FROM THE FIBROIDS-STATES SHE IS POST MENOPAUSAL-LAST MENSES 2010 EXCEPT FOR EPISODE THIS YR OF BLEEDING RELATED TO FIBROIDS.   Weakness    BOTH HANDS - S/P BILATERAL CARPAL TUNNEL RELEASE--BUT STILL HAS WEAKNESS--OFTEN DROPS THINGS    Past Surgical History:  Past Surgical History:  Procedure Laterality Date   BACK SURGERY  02/11/2021   Dr Louanne Skye   BREAST BIOPSY Right 06/04/2018   BREAST LUMPECTOMY Right 06/2018   BREAST LUMPECTOMY WITH RADIOACTIVE SEED AND SENTINEL LYMPH NODE BIOPSY Right  07/19/2018   Procedure: BREAST LUMPECTOMY WITH RADIOACTIVE SEED AND SENTINEL LYMPH NODE BIOPSY;  Surgeon: Alphonsa Overall, MD;  Location: [place];  Service: General;  Laterality: Right;   CARDIAC CATHETERIZATION     CARDIAC VALVE REPLACEMENT     2017   CARPAL TUNNEL RELEASE     Bilateral   CHOLECYSTECTOMY     COLONOSCOPY     CORONARY ANGIOPLASTY     2010 has stent in place   CYSTOSCOPY W/ RETROGRADES Right 09/21/2013   Procedure: CYSTOSCOPY WITH RIGHT RETROGRADE PYELOGRAM RIGHT DOUBLE J STENT ;  Surgeon: Fredricka Bonine, MD;  [facility];  Service: Urology;  Laterality: Right;   CYSTOSCOPY W/ URETERAL STENT PLACEMENT Right 11/16/2022   Procedure: CYSTOSCOPY WITH RETROGRADE PYELOGRAM/URETERAL STENT PLACEMENT;  Surgeon: Vira Agar, MD;  Location: [place];  Service: Urology;  Laterality: Right;   CYSTOSCOPY WITH URETEROSCOPY AND STENT PLACEMENT Right 10/25/2013   Procedure: CYSTOSCOPY RIGHT URETEROSCOPY HOLMIUM LASER LITHO AND  STENT PLACEMENT;  Surgeon: Fredricka Bonine, MD;  [facility];  Service: Urology;  Laterality: Right;   HERNIA REPAIR     INTRAOPERATIVE TRANSESOPHAGEAL ECHOCARDIOGRAM N/A 12/12/2014   Procedure: INTRAOPERATIVE TRANSESOPHAGEAL ECHOCARDIOGRAM;  Surgeon: Burnell Blanks, MD;  Location: [place];  Service: Cardiovascular;  Laterality: N/A;   JOINT REPLACEMENT     bil total knees   KNEE ARTHROPLASTY  02/03/2012   Procedure: COMPUTER ASSISTED TOTAL KNEE ARTHROPLASTY;  Surgeon: Mcarthur Rossetti, MD;  Location: [place];  Service: Orthopedics;  Laterality: Right;  Right total knee arthroplasty   LEFT AND RIGHT HEART CATHETERIZATION WITH CORONARY ANGIOGRAM N/A 03/17/2013   Procedure: LEFT AND RIGHT HEART CATHETERIZATION WITH CORONARY ANGIOGRAM;  Surgeon: Burnell Blanks, MD;  Location: [name] CATH LAB;  Service: Cardiovascular;  Laterality: N/A;   LEFT AND RIGHT HEART CATHETERIZATION WITH CORONARY/GRAFT ANGIOGRAM N/A 09/14/2014   Procedure:  LEFT AND RIGHT HEART CATHETERIZATION WITH Beatrix Fetters;  Surgeon: Burnell Blanks, MD;  Location: [facility] CATH LAB;  Service: Cardiovascular;  Laterality: N/A;   REVERSE SHOULDER ARTHROPLASTY Left 03/06/2022   Procedure: LEFT SHOULDER REPLACEMENT APPLICATION OF WOUND VAC;  Surgeon: [name], MD;  Location: [place];  Service: Orthopedics;  Laterality: Left;   TEE WITHOUT CARDIOVERSION N/A 03/14/2013   Procedure: TRANSESOPHAGEAL ECHOCARDIOGRAM (TEE);  Surgeon: Lelon Perla, MD;  [facility];  Service: Cardiovascular;  Laterality: N/A;   TEE WITHOUT CARDIOVERSION N/A 11/14/2014   Procedure: TRANSESOPHAGEAL ECHOCARDIOGRAM (TEE);  Surgeon: Lelon Perla, MD;  Location: [facility] ENDOSCOPY;  Service: Cardiovascular;  Laterality: N/A;   TONSILLECTOMY     maybe as a child- does not know   TOTAL KNEE ARTHROPLASTY  09/10/2012   Procedure: TOTAL KNEE ARTHROPLASTY;  Surgeon: Mcarthur Rossetti, MD;  [facility];  Service: Orthopedics;  Laterality: Left;   TOTAL KNEE REVISION Right 07/15/2013   Procedure: REVISION ARTHROPLASTY RIGHT KNEE;  Surgeon: Mcarthur Rossetti, MD;  [facility];  Service: Orthopedics;  Laterality: Right;   TRANSCATHETER AORTIC VALVE REPLACEMENT, TRANSFEMORAL N/A 12/12/2014   Procedure: TRANSCATHETER AORTIC VALVE REPLACEMENT, TRANSFEMORAL;  Surgeon: Burnell Blanks, MD;  Location: [place];  Service: Cardiovascular;  Laterality: N/A;   TRIGGER FINGER RELEASE  09/10/2012   Procedure: RELEASE TRIGGER FINGER/A-1 PULLEY;  Surgeon: Mcarthur Rossetti, MD;  [facility];  Service: Orthopedics;  Laterality: Right;  Right Ring Finger   TUBAL LIGATION      Allergies:  No Known Allergies  Family History:  Family History  Problem Relation Age of Onset   Breast cancer Mother        stage IV at diagnosis   Emphysema Mother        smoked   Heart disease Mother    COPD Father        smoked   Asthma Father    Heart disease  Father    Cancer Brother        Sinus    Social History:  Social History   Tobacco Use   Smoking status: Former    Packs/day: 1.50    Years: 30.00    Total pack years: 45.00    Types: Cigarettes    Quit date: 12/29/2000    Years since quitting: 21.9   Smokeless tobacco: Never  Vaping Use   Vaping Use: Never used  Substance Use Topics   Alcohol use: Not Currently   Drug use: Not Currently    Types: Marijuana    Comment: CBD oils  through [place] of symptoms:  Constitutional:  Negative for unexplained weight loss, night sweats, fever, chills ENT:  Negative for nose bleeds, sinus pain, painful swallowing CV:  Negative for chest pain, shortness of breath, exercise intolerance, palpitations, loss of consciousness Resp:  Negative for cough, wheezing, shortness of breath GI:  Negative for nausea, vomiting, diarrhea, bloody stools GU:  Positives noted in HPI; otherwise negative for dysuria, urinary incontinence Neuro:  Negative for seizures, poor balance, limb weakness, slurred speech Psych:  Negative for lack of energy, depression, anxiety Endocrine:  Negative for polydipsia, polyuria, symptoms of hypoglycemia (dizziness, hunger, sweating) Hematologic:  Negative for anemia, purpura, petechia, prolonged or excessive bleeding, use of anticoagulants  Allergic:  Negative for difficulty breathing or choking as a result of exposure to anything; no shellfish allergy; no allergic response (rash/itch) to materials, foods  Physical exam: BP (!) 160/71   Pulse 88   Ht '5\' 3"'$  (1.6 m)   Wt (!) 303 lb (137.4 kg)   LMP 02/03/2012   BMI 53.67 kg/m  GENERAL APPEARANCE:  Well appearing, well developed, well nourished, NAD HEENT: Atraumatic, Normocephalic, oropharynx clear. NECK: Supple without lymphadenopathy or thyromegaly. LUNGS: Clear to auscultation bilaterally. HEART: Regular Rate and Rhythm without murmurs, gallops, or rubs. ABDOMEN: Soft, non-tender, No  Masses. EXTREMITIES: Moves all extremities well.  Without clubbing, cyanosis, or edema. NEUROLOGIC:  Alert and oriented x 3, normal gait, CN II-XII grossly intact.  MENTAL STATUS:  Appropriate. BACK:  Non-tender to palpation.  No CVAT SKIN:  Warm, dry and intact.    Results: Dipstick U/A: 2+ glucose, 3+ blood, 2+ protein, 3+ leukocytes, nitrite positive

## 2022-12-05 NOTE — Telephone Encounter (Signed)
   Name: JANAYSIA MCLEROY  DOB: 09/21/1961  MRN: [id number]  Primary Cardiologist: Lauree Chandler, MD  Chart reviewed as part of pre-operative protocol coverage. The patient has an upcoming visit scheduled with Christen Bame, NP on 12/25/2022 at which time clearance can be addressed in case there are any issues that would impact surgical recommendations.  Cystoscopy is not scheduled until 01/13/2023 as below. I added preop FYI to appointment note so that provider is aware to address at time of outpatient visit.  Per office protocol the cardiology provider should forward their finalized clearance decision and recommendations regarding antiplatelet therapy to the requesting party below.    I will route this message as FYI to requesting party and remove this message from the preop box as separate preop APP input not needed at this time.   Please call with any questions.  Lenna Sciara, NP  12/05/2022, 3:42 PM

## 2022-12-08 ENCOUNTER — Encounter: Payer: Self-pay | Admitting: Obstetrics and Gynecology

## 2022-12-08 ENCOUNTER — Other Ambulatory Visit: Payer: Medicaid Other | Admitting: Obstetrics and Gynecology

## 2022-12-08 NOTE — Patient Instructions (Signed)
Hi [name], I am glad you are feeling better-have a great week!  [name] was given information about Medicaid Managed Care team care coordination services as a part of their [facility] Medicaid benefit. [name] verbally consented to engagement with the [facility] Managed Care team.   If you are experiencing a medical emergency, please call 911 or report to your local emergency department or urgent care.   If you have a non-emergency medical problem during routine business hours, please contact your provider's office and ask to speak with a nurse.   For questions related to your [facility] health plan, please call: [phone] or go here:[email or web address]  If you would like to schedule transportation through your [facility] plan, please call the following number at least 2 days in advance of your appointment: [phone].  You can also use the MTM portal or MTM mobile app to manage your rides. For the portal, please go to [email or web address].  Call the [place] at [phone], at any time, 24 hours a day, 7 days a week. If you are in danger or need immediate medical attention call 911.  If you would like help to quit smoking, call 1-800-QUIT-NOW 9154330209) OR Espaol: 1-855-Djelo-Ya (5-364-680-3212) o para ms informacin haga clic aqu or Text READY to 200-400 to register via text  [name] - following are the goals we discussed in your visit today:   Goals Addressed    Timeframe:  Long-Range Goal Priority:  High Start Date:      04/14/22                       Expected End Date:     ongoing                  Follow Up Date 01/09/23   - schedule appointment for flu shot - schedule appointment for vaccines needed due to my age or health - schedule recommended health tests (blood work, mammogram, colonoscopy, pap test) - schedule and keep appointment for annual check-up    Why is this important?   Screening tests can  find diseases early when they are easier to treat.  Your doctor or nurse will talk with you about which tests are important for you.  Getting shots for common diseases like the flu and shingles will help prevent them.  12/08/22:  Patient seen and evaluated by PCP 11/29, ID 12/6.  Has stent placement 1/23.  Patient verbalizes understanding of instructions and care plan provided today and agrees to view in [place]. Active MyChart status and patient understanding of how to access instructions and care plan via MyChart confirmed with patient.     The Managed Medicaid care management team will reach out to the patient again over the next 30 business  days.  The  Patient  has been provided with contact information for the Managed Medicaid care management team and has been advised to call with any health related questions or concerns.   Aida Raider RN, BSN [place] Management Coordinator - Managed Medicaid High Risk [phone]   Following is a copy of your plan of care:  Care Plan : [place] of Care  Updates made by Gayla Medicus, RN since 12/08/2022 12:00 AM     Problem: Health Promotion or Disease Self-Management (General Plan of Care)   Priority: High  Onset Date: 04/14/2022     Long-Range Goal: Chronic Disease Management and  Care Coordination Needs   Start Date: 04/14/2022  Expected End Date: 02/05/2023  Recent Progress: Not on track  Priority: High  Note:   Current Barriers:  Knowledge Deficits related to plan of care for management of Anxiety and Depression along with other chronic health conditions, HTN, DM Chronic Disease Management support and education needs related to Anxiety and Depression. DM, HTN 12/08/22:  Patient hospitalized 11/19-11/22 for UTI/sepsis, has appt 1/23 for stent placement.  Patient not checking BP, blood sugar is 137 this morning.  Has new scooter which she likes.  Increased trazodone dose is helping her. RNCM  Clinical Goal(s):  Patient will verbalize understanding of plan for management of Anxiety and Depression, DM, HTN  as evidenced by patient report take all medications exactly as prescribed and will call provider for medication related questions as evidenced by patient report demonstrate understanding of rationale for each prescribed medication as evidenced by patient report attend all scheduled medical appointments  as evidenced by patient report continue to work with RN Care Manager to address care management and care coordination needs related to  Anxiety and Depression, DM, HTN as evidenced by adherence to CM Team Scheduled appointments work with Education officer, museum to address  related to the management of Mental Health Concerns  related to the management of Anxiety and Depression as evidenced by review of EMR and patient or Education officer, museum report through collaboration with Consulting civil engineer, provider, and care team.   Interventions: Inter-disciplinary care team collaboration (see longitudinal plan of care) Evaluation of current treatment plan related to  self management and patient's adherence to plan as established by provider   (Status:  New goal.)  Long Term Goal Evaluation of current treatment plan related to Anxiety and Depression, Mental Health Concerns  self-management and patient's adherence to plan as established by provider. Discussed plans with patient for ongoing care management follow up and provided patient with direct contact information for care management team Advised patient to contact provider for any additional DME needs, CBG, and possible general surgeon referral. Reviewed medications with patient Collaborated with LCSW regarding anxiety/depression Reviewed scheduled/upcoming provider appointments Advised patient, providing education and rationale, to check cbg and record, calling provider  for findings outside established parameters  Social Work referral for  anxiety/depression Discussed plans with patient for ongoing care management follow up and provided patient with direct contact information for care management team Assessed social determinant of health barriers Collaborated with [place] Medicaid Liaison regarding DME-motorized wheelchair and lift chair.  Diabetes Interventions:  (Status:  New goal.) Long Term Goal Assessed patient's understanding of A1c goal: <7% Reviewed medications with patient and discussed importance of medication adherence Reviewed scheduled/upcoming provider appointments  Advised patient, providing education and rationale, to check cbg and record, calling provider for findings outside established parameters Review of patient status, including review of consultants reports, relevant laboratory and other test results, and medications completed Assessed social determinant of health barriers Lab Results  Component Value Date   HGBA1C 7.1 (A) 08/27/2022    Hypertension Interventions:  (Status:  New goal.) Long Term Goal Last practice recorded BP readings:  BP Readings from Last 3 Encounters:  09/12/22 133/69  08/27/22 (!) 109/58  07/01/22 137/75   Most recent eGFR/CrCl:  Lab Results  Component Value Date   EGFR 90 06/04/2022    No components found for: "CRCL"  Evaluation of current treatment plan related to hypertension self management and patient's adherence to plan as established by provider Reviewed medications with patient  and discussed importance of compliance Discussed plans with patient for ongoing care management follow up and provided patient with direct contact information for care management team Advised patient, providing education and rationale, to monitor blood pressure daily and record, calling PCP for findings outside established parameters Reviewed scheduled/upcoming provider appointments including:  Assessed social determinant of health barriers   Patient Goals/Self-Care  Activities: Take all medications as prescribed Attend all scheduled provider appointments Call pharmacy for medication refills 3-7 days in advance of running out of medications Perform all self care activities independently  Perform IADL's (shopping, preparing meals, housekeeping, managing finances) independently Call provider office for new concerns or questions  Work with the social worker to address care coordination needs and will continue to work with the clinical team to address health care and disease management related needs Patient to contact provider regarding BP, iron, and Trelegy.  Follow Up Plan:  The patient has been provided with contact information for the care management team and has been advised to call with any health related questions or concerns.  The care management team will reach out to the patient again over the next 30 business  days.

## 2022-12-08 NOTE — Patient Outreach (Signed)
Medicaid Managed Care   Nurse Care Manager Note  12/08/2022 Name:  [name] MRN:  [id number] DOB:  Apr 08, 1961  KEUNDRA PETRUCELLI is an 61 y.o. year old female who is a primary patient of [name], MD.  The [facility] Managed Care Coordination team was consulted for assistance with:    Chronic healthcare management needs, HTN, DM, HF, asthma, COPD, OSA, anxiety/depression/PTSD, arthritis, GERD. CAD, HLD, h/o breast cancer, RLS  Ms. Lykins was given information about Medicaid Managed Care Coordination team services today. [name] Patient agreed to services and verbal consent obtained.  Engaged with patient by telephone for follow up visit in response to provider referral for case management and/or care coordination services.   Assessments/Interventions:  Review of past medical history, allergies, medications, health status, including review of consultants reports, laboratory and other test data, was performed as part of comprehensive evaluation and provision of chronic care management services.  SDOH (Social Determinants of Health) assessments and interventions performed: SDOH Interventions    Flowsheet Row Patient Outreach Telephone from 12/08/2022 in [place] Most recent reading at 12/08/2022 10:22 AM ED to Hosp-Admission (Discharged) from 11/16/2022 in [place] Most recent reading at 11/16/2022  5:56 PM Patient Outreach Telephone from 11/07/2022 in [place] Most recent reading at 11/07/2022  2:14 PM Patient Outreach Telephone from 11/07/2022 in [place] Most recent reading at 11/07/2022 11:06 AM Patient Outreach Telephone from 11/05/2022 in [place] Most recent reading at 11/05/2022 12:56 PM Patient Outreach Telephone from 10/14/2022 in [place] Most recent reading at  10/14/2022 10:49 AM  SDOH Interventions        Food Insecurity Interventions -- -- -- -- Intervention Not Indicated --  Housing Interventions -- Intervention Not Indicated -- -- Intervention Not Indicated --  Alcohol Usage Interventions Intervention Not Indicated (Score <7) -- -- -- -- --  Financial Strain Interventions Intervention Not Indicated -- -- -- -- --  Physical Activity Interventions -- -- Other (Comments)  [patient not physically able to engage in moderate to strenuous exercise] -- -- --  Stress Interventions -- -- -- Provide Counseling, [place], Provide Counseling  Social Connections Interventions -- -- Intervention Not Indicated -- -- --      Care Plan  No Known Allergies  Medications Reviewed Today     Reviewed by Gayla Medicus, RN (Registered Nurse) on 12/08/22 at 1023  Med List Status: <None>   Medication Order Taking? Sig Documenting Provider Last Dose Status Informant  Accu-Chek FastClix Lancets MISC [id number] No Check blood sugar two times a day Lorella Nimrod, MD Taking Active Self  albuterol (PROVENTIL) (2.5 MG/3ML) 0.083% nebulizer solution [id number] No USE 1 VIAL IN NEBULIZER EVERY 4 HOURS AS NEEDED FOR WHEEZING OR SHORTNESS OF BREATH  Patient taking differently: Take 2.5 mg by nebulization every 4 (four) hours as needed for wheezing or shortness of breath.   Lacinda Axon, MD Taking Active Self  amlodipine-atorvastatin (CADUET) 5-80 MG tablet [id number] No Take 1 tablet by mouth daily. Leigh Aurora, DO Taking Active   ASPIRIN ADULT LOW STRENGTH 81 MG EC tablet [id number] No TAKE 1 Tablet BY MOUTH ONCE DAILY Lorella Nimrod, MD Taking Active Self  B-D UF III MINI PEN NEEDLES 31G X 5 MM MISC [id number] No USE AS DIRECTED DAILY Angelica Pou, MD Taking Active Self  Blood Glucose  Monitoring Suppl (ACCU-CHEK GUIDE) w/Device KIT [id number] No 1 each by Does not apply route 2 (two) times daily.  Lorella Nimrod, MD Taking Active Self  busPIRone (BUSPAR) 10 MG tablet [id number] No Take 1 tablet (10 mg total) by mouth 3 (three) times daily.  Patient taking differently: Take 10 mg by mouth 2 (two) times daily.   Salley Slaughter, NP Taking Active Self  cefdinir (OMNICEF) 300 MG capsule [id number]  Take 1 capsule (300 mg total) by mouth 2 (two) times daily for 7 days. [place], Reece Leader., MD  Active   citalopram (CELEXA) 20 MG tablet [id number] No TAKE 1 TABLET BY MOUTH ONCE DAILY. DO NOT TAKE WHILE TAKING DULOXETINE  Patient taking differently: Take 20 mg by mouth daily. TAKE 1 TABLET BY MOUTH ONCE DAILY. DO NOT TAKE WHILE TAKING DULOXETINE   Salley Slaughter, NP Taking Active Self  clopidogrel (PLAVIX) 75 MG tablet [id number] No TAKE 1 TABLET BY MOUTH IN THE MORNING  Patient taking differently: Take 75 mg by mouth daily.   [name], MD Taking Active Self  Continuous Blood Gluc Sensor (FREESTYLE LIBRE 3 SENSOR) [place] [id number] No Place 1 sensor on the skin every 14 days. Use to check glucose continuously [name], MD Taking Active Self  Dulaglutide (TRULICITY) 4.5 OE/4.2PN SOPN [id number] No Inject 4.5 mg as directed once a week. Nooruddin, Marlene Lard, MD Taking Active   Evolocumab (REPATHA SURECLICK) 008 MG/ML [name] [id number] No INJECT 1 PEN INTO THE SKIN EVERY 14 DAYS  Patient taking differently: Inject 140 mg into the skin every 14 (fourteen) days.   Burnell Blanks, MD Taking Active Self  exemestane (AROMASIN) 25 MG tablet [id number] No Take 1 tablet (25 mg total) by mouth daily after breakfast. [name], MD Taking Active Self  ezetimibe (ZETIA) 10 MG tablet [id number] No Take 1 tablet (10 mg total) by mouth daily. [name], PA-C 11/15/2022 Expired 11/16/22 2359 Self  ferrous sulfate 325 (65 FE) MG tablet [id number] No Take 1 tablet (325 mg total) by mouth every other day. [name], MD Taking Active Self  fexofenadine (ALLEGRA) 180 MG tablet [id number]  No Take 1 tablet (180 mg total) by mouth daily. [name], PA-C 11/15/2022 Expired 11/16/22 2359 Self  fluticasone (FLONASE) 50 MCG/ACT nasal spray [id number] No Place 1 spray into both nostrils daily. Begin by using 2 sprays in each nare daily for 3 to 5 days, then decrease to 1 spray in each nare daily. [name], PA-C Taking Active Self  Discontinued 05/09/20 1429   Fluticasone-Umeclidin-Vilant (TRELEGY ELLIPTA) 100-62.5-25 MCG/ACT AEPB [id number] No INHALE 1 PUFF ONCE DAILY Strength: 100-62.5-25 MCG/ACT  Patient taking differently: Inhale 1 puff into the lungs 2 (two) times daily.   Lacinda Axon, MD Taking Active Self  furosemide (LASIX) 40 MG tablet [id number] No Take 1 tablet (40 mg total) by mouth as needed for fluid or edema.  Patient taking differently: Take 40 mg by mouth every other day.   Dorethea Clan, DO Taking Active Self  gabapentin (NEURONTIN) 300 MG capsule [id number] No TAKE 2 CAPSULES BY MOUTH IN THE MORNING AND TAKE 3 CAPSULES AT BEDTIME [name], MD Taking Active   glucose blood (ACCU-CHEK GUIDE) test strip [id number] No Check blood sugar 2 times per day [name], MD Taking Active Self  insulin glargine-yfgn (SEMGLEE) 100 UNIT/ML Pen [id number] No Inject 30 Units into the skin at bedtime. [name], MD Taking Active Self  LANTUS SOLOSTAR 100 UNIT/ML Solostar Pen [id number]  No Inject 30 Units into the skin at bedtime. [provider] Taking Active Self  losartan-hydrochlorothiazide (HYZAAR) 100-25 MG tablet [id number] No Take 1 tablet by mouth daily. [name], MD 11/15/2022 Expired 11/17/22 2359 Self  metFORMIN (GLUCOPHAGE-XR) 500 MG 24 hr tablet [id number] No Take 1 tablet (500 mg total) by mouth 2 (two) times daily. [name], MD Taking Active Self  methocarbamol (ROBAXIN) 500 MG tablet [id number] No Take 1 tablet (500 mg total) by mouth every 6 (six) hours as needed for muscle spasms. [name],  MD Taking Active Self  metoprolol succinate (TOPROL XL) 50 MG 24 hr tablet [id number] No Take 1 tablet (50 mg total) by mouth daily. Take with or immediately following a meal. [name], MD 11/15/2022 1000 Expired 11/17/22 2359 Self  montelukast (SINGULAIR) 10 MG tablet [id number] No Take 1 tablet (10 mg total) by mouth at bedtime. [name], MD Taking Active Self  senna-docusate (SENNA-TIME S) 8.6-50 MG tablet [id number] No Take 1 tablet by mouth at bedtime as needed for mild constipation. [name], MD Taking Active Self  traMADol (ULTRAM) 50 MG tablet [id number] No Take 1 tablet (50 mg total) by mouth every 6 (six) hours as needed.  Patient taking differently: Take 50 mg by mouth every 6 (six) hours as needed for moderate pain or severe pain.   Hazel Sams, PA-C Taking Active Self  traZODone (DESYREL) 50 MG tablet [id number] No Take 1-2 tablets (50-100 mg total) by mouth at bedtime. Salley Slaughter, NP Taking Active Self  VENTOLIN HFA 108 (90 Base) MCG/ACT inhaler [id number] No INHALE 2 PUFFS BY MOUTH EVERY 6 HOURS AS NEEDED FOR WHEEZING FOR SHORTNESS OF BREATH  Patient taking differently: Inhale 2 puffs into the lungs every 6 (six) hours as needed for wheezing or shortness of breath.   [name], MD Taking Active Self           Patient Active Problem List   Diagnosis Date Noted   History of UTI 12/05/2022   Sepsis due to Escherichia coli with encephalopathy and septic shock ([place]) 11/17/2022   COPD exacerbation ([place]) 11/16/2022   Diabetic neuropathy ([place]) 08/29/2022   Vaginal lesion 08/29/2022   PTSD (post-traumatic stress disorder) 06/24/2022   GAD (generalized anxiety disorder) 06/24/2022   Major depressive disorder, recurrent episode, moderate ([place]) 06/24/2022   Urinary incontinence 06/20/2022   Intertrigo 06/20/2022   Arthritis of left shoulder region    S/P reverse total shoulder arthroplasty, 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   Healthcare maintenance 11/17/2021    Spondylolisthesis, lumbar region 02/11/2021    Class: Acute   Malignant neoplasm of upper-outer quadrant of right breast in female, estrogen receptor positive ([place]) 06/10/2018   Restless leg syndrome 02/04/2018   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   Depression 08/06/2015   Hypertension associated with diabetes ([place])    Aortic stenosis    Chronic diastolic congestive heart failure (HCC)    COPD (chronic obstructive pulmonary disease) ([place]) 09/21/2013   Obstructive sleep apnea 09/21/2013   Nephrolithiasis 09/21/2013   Nocturnal hypoxemia 09/12/2013   Hyperlipidemia 11/30/2012   History of pulmonary embolus (PE) 07/09/2012   Normocytic anemia 02/08/2012   Type 2 diabetes mellitus without complication, without long-term current use of insulin ([place]) 02/08/2012   Asthma 02/08/2012   GERD (gastroesophageal reflux disease)    CAD (coronary artery disease) 12/08/2011   Conditions to be addressed/monitored per PCP order:  Chronic healthcare management needs, HTN, DM, HF, asthma, COPD, OSA, anxiety/depression/PTSD, arthritis, GERD. CAD, HLD, h/o breast  cancer, RLS  Care Plan : RN Care Manager Plan of Care  Updates made by Gayla Medicus, RN since 12/08/2022 12:00 AM     Problem: Health Promotion or Disease Self-Management (General Plan of Care)   Priority: High  Onset Date: 04/14/2022     Long-Range Goal: Chronic Disease Management and Care Coordination Needs   Start Date: 04/14/2022  Expected End Date: 02/05/2023  Recent Progress: Not on track  Priority: High  Note:   Current Barriers:  Knowledge Deficits related to plan of care for management of Anxiety and Depression along with other chronic health conditions, HTN, DM Chronic Disease Management support and education needs related to Anxiety and Depression. DM, HTN 12/08/22:  Patient hospitalized 11/19-11/22 for UTI/sepsis, has appt 1/23 for stent placement.  Patient not checking BP, blood sugar is 137 this morning.  Has  new scooter which she likes.  Increased trazodone dose is helping her. RNCM Clinical Goal(s):  Patient will verbalize understanding of plan for management of Anxiety and Depression, DM, HTN  as evidenced by patient report take all medications exactly as prescribed and will call provider for medication related questions as evidenced by patient report demonstrate understanding of rationale for each prescribed medication as evidenced by patient report attend all scheduled medical appointments  as evidenced by patient report continue to work with RN Care Manager to address care management and care coordination needs related to  Anxiety and Depression, DM, HTN as evidenced by adherence to CM Team Scheduled appointments work with Education officer, museum to address  related to the management of Mental Health Concerns  related to the management of Anxiety and Depression as evidenced by review of EMR and patient or Education officer, museum report through collaboration with Consulting civil engineer, provider, and care team.   Interventions: Inter-disciplinary care team collaboration (see longitudinal plan of care) Evaluation of current treatment plan related to  self management and patient's adherence to plan as established by provider   (Status:  New goal.)  Long Term Goal Evaluation of current treatment plan related to Anxiety and Depression, Mental Health Concerns  self-management and patient's adherence to plan as established by provider. Discussed plans with patient for ongoing care management follow up and provided patient with direct contact information for care management team Advised patient to contact provider for any additional DME needs, CBG, and possible general surgeon referral. Reviewed medications with patient Collaborated with LCSW regarding anxiety/depression Reviewed scheduled/upcoming provider appointments Advised patient, providing education and rationale, to check cbg and record, calling provider  for findings  outside established parameters  Social Work referral for anxiety/depression Discussed plans with patient for ongoing care management follow up and provided patient with direct contact information for care management team Assessed social determinant of health barriers Collaborated with [place] Medicaid Liaison regarding DME-motorized wheelchair and lift chair.  Diabetes Interventions:  (Status:  New goal.) Long Term Goal Assessed patient's understanding of A1c goal: <7% Reviewed medications with patient and discussed importance of medication adherence Reviewed scheduled/upcoming provider appointments  Advised patient, providing education and rationale, to check cbg and record, calling provider for findings outside established parameters Review of patient status, including review of consultants reports, relevant laboratory and other test results, and medications completed Assessed social determinant of health barriers Lab Results  Component Value Date   HGBA1C 7.1 (A) 08/27/2022    Hypertension Interventions:  (Status:  New goal.) Long Term Goal Last practice recorded BP readings:  BP Readings from Last 3 Encounters:  09/12/22 133/69  08/27/22 (!) 109/58  07/01/22 137/75   Most recent eGFR/CrCl:  Lab Results  Component Value Date   EGFR 90 06/04/2022    No components found for: "CRCL"  Evaluation of current treatment plan related to hypertension self management and patient's adherence to plan as established by provider Reviewed medications with patient and discussed importance of compliance Discussed plans with patient for ongoing care management follow up and provided patient with direct contact information for care management team Advised patient, providing education and rationale, to monitor blood pressure daily and record, calling PCP for findings outside established parameters Reviewed scheduled/upcoming provider appointments including:  Assessed social  determinant of health barriers   Patient Goals/Self-Care Activities: Take all medications as prescribed Attend all scheduled provider appointments Call pharmacy for medication refills 3-7 days in advance of running out of medications Perform all self care activities independently  Perform IADL's (shopping, preparing meals, housekeeping, managing finances) independently Call provider office for new concerns or questions  Work with the social worker to address care coordination needs and will continue to work with the clinical team to address health care and disease management related needs Patient to contact provider regarding BP, iron, and Trelegy.  Follow Up Plan:  The patient has been provided with contact information for the care management team and has been advised to call with any health related questions or concerns.  The care management team will reach out to the patient again over the next 30 business  days.     Long-Range Goal: Establish Plan of Care for Chronic Disease Management Needs   Start Date: 04/14/2022  Expected End Date: 07/14/2022  Priority: High  Note:   Timeframe:  Long-Range Goal Priority:  High Start Date:      04/14/22                       Expected End Date:     ongoing                  Follow Up Date 01/09/23   - schedule appointment for flu shot - schedule appointment for vaccines needed due to my age or health - schedule recommended health tests (blood work, mammogram, colonoscopy, pap test) - schedule and keep appointment for annual check-up    Why is this important?   Screening tests can find diseases early when they are easier to treat.  Your doctor or nurse will talk with you about which tests are important for you.  Getting shots for common diseases like the flu and shingles will help prevent them.  12/08/22:  Patient seen and evaluated by PCP 11/29, ID 12/6.  Has stent placement 1/23.   Follow Up:  Patient agrees to Care Plan and  Follow-up.  Plan: The Managed Medicaid care management team will reach out to the patient again over the next 30 business  days. and The  Patient has been provided with contact information for the Managed Medicaid care management team and has been advised to call with any health related questions or concerns.  Date/time of next scheduled RN care management/care coordination outreach: 01/09/23 at 315.

## 2022-12-09 LAB — CULTURE, BLOOD (SINGLE)

## 2022-12-09 NOTE — Telephone Encounter (Signed)
Called pt and let her know that her BCx were (-) and her ESR was normal.

## 2022-12-10 LAB — URINE CULTURE

## 2022-12-10 NOTE — Progress Notes (Signed)
Clinicals faxed to insurance [employer] for PA for upcoming surgery. Approval pending.

## 2022-12-11 ENCOUNTER — Other Ambulatory Visit: Payer: Self-pay

## 2022-12-11 ENCOUNTER — Telehealth: Payer: Self-pay

## 2022-12-11 ENCOUNTER — Ambulatory Visit (INDEPENDENT_AMBULATORY_CARE_PROVIDER_SITE_OTHER): Payer: Medicaid Other | Admitting: Licensed Clinical Social Worker

## 2022-12-11 DIAGNOSIS — F411 Generalized anxiety disorder: Secondary | ICD-10-CM | POA: Diagnosis not present

## 2022-12-11 DIAGNOSIS — F331 Major depressive disorder, recurrent, moderate: Secondary | ICD-10-CM

## 2022-12-11 DIAGNOSIS — Z87440 Personal history of urinary (tract) infections: Secondary | ICD-10-CM

## 2022-12-11 MED ORDER — NITROFURANTOIN MONOHYD MACRO 100 MG PO CAPS: 100.0000 mg | ORAL_CAPSULE | Freq: Every day | ORAL | 2 refills | Status: DC

## 2022-12-11 NOTE — Progress Notes (Signed)
THERAPIST PROGRESS NOTE  Virtual Visit via Video Note  I connected with [name] on 12/11/22 at  3:00 PM EST by a video enabled telemedicine application and verified that I am speaking with the correct person using two identifiers.  Location: Patient: [facility]  Provider: Providers Home    I discussed the limitations of evaluation and management by telemedicine and the availability of in person appointments. The patient expressed understanding and agreed to proceed.    I discussed the assessment and treatment plan with the patient. The patient was provided an opportunity to ask questions and all were answered. The patient agreed with the plan and demonstrated an understanding of the instructions.   The patient was advised to call back or seek an in-person evaluation if the symptoms worsen or if the condition fails to improve as anticipated.  I provided 30 minutes of non-face-to-face time during this encounter.   [name] Horn, LCSW   Participation Level: Active  Behavioral Response: Casual and Fairly GroomedAlertAnxious and Depressed  Type of Therapy: Individual Therapy  Treatment Goals addressed:  Active     Anxiety Disorder CCP Problem  1 GAD      LTG: Patient will score less than 5 on the Generalized Anxiety Disorder 7 Scale (GAD-7) (Completed/Met)     Start:  06/24/22    Expected End:  01/28/23    Resolved:  12/11/22      STG: Patient will participate in at least 80% of scheduled individual psychotherapy sessions (Progressing)     Start:  06/24/22    Expected End:  01/28/23         STG: Patient will complete at least 80% of assigned homework (Progressing)     Start:  06/24/22    Expected End:  01/28/23         STG: Patient will practice problem solving skills 3 times per week for the next 4 weeks (Progressing)     Start:  06/24/22    Expected End:  01/28/23           Depression CCP Problem  1 MDD     LTG: [name] WILL SCORE  LESS THAN 10 ON THE PATIENT HEALTH QUESTIONNAIRE (PHQ-9) (Completed/Met)     Start:  06/24/22    Expected End:  01/28/23    Resolved:  12/11/22   Reviewed intervention       STG: [name] WILL PARTICIPATE IN AT LEAST 80% OF SCHEDULED INDIVIDUAL PSYCHOTHERAPY SESSIONS (Progressing)     Start:  06/24/22    Expected End:  01/28/23         STG: [name] WILL COMPLETE AT LEAST 80% OF ASSIGNED HOMEWORK (Progressing)     Start:  06/24/22    Expected End:  01/28/23            ProgressTowards Goals: Progressing  Interventions: Motivational Interviewing and Supportive   Suicidal/Homicidal: Nowithout intent/plan  Therapist Response:     Pt was alert and oriented x 5. She was dressed casually and engaged well in therapy. She was pleasant, cooperative and maintained good eye contact. Pt presented with depressed and anxious mood/affect.   Pt reports today that she has been setting better boundaries with her roommates. Pt reports used the example of not making them dinner or food just because she made some for her spouse. Pt reports "I am not their momma, and they should not treat me as such". Pt states that now better boundaries are being set with her roommates. Also,  her communication with her sons have improved she as well would like to start working through her childhood trauma. LCSW was agreeable to start this with next session. LCSW administered a Gad-7. LCSW administered a PHQ-9. LCSW reviewed scores with pt. LCSW met goals for both PHQ-9 and GAD-7. LCSW updated treatment plan to reflect goals being met.   Plan: Return again in 3 weeks.  Diagnosis: GAD (generalized anxiety disorder)  Major depressive disorder, recurrent episode, moderate ([place])  Collaboration of Care: Other None today   Patient/Guardian was advised Release of Information must be obtained prior to any record release in order to collaborate their care with an outside provider. Patient/Guardian was advised if they have  not already done so to contact the registration department to sign all necessary forms in order for [place] to release information regarding their care.   Consent: Patient/Guardian gives verbal consent for treatment and assignment of benefits for services provided during this visit. Patient/Guardian expressed understanding and agreed to proceed.   [name] Horn, LCSW 12/11/2022

## 2022-12-11 NOTE — Telephone Encounter (Signed)
-----   Message from Primus Bravo, MD sent at 12/11/2022  1:02 PM EST ----- Please notify patient to complete Cefdinir as prescribed for UTI.  I would like for her to then begin Macrobid daily for UTI prevention.  I would also like for her to have a repeat U/A and culture done the week of 12/24/22.

## 2022-12-11 NOTE — Telephone Encounter (Signed)
Patient aware of MD response and voiced understanding.  Lab visit scheduled for ua and culture on 12/27 at 11a

## 2022-12-11 NOTE — Addendum Note (Signed)
Addended by: Primus Bravo on: 12/11/2022 01:02 PM   Modules accepted: Orders

## 2022-12-13 ENCOUNTER — Other Ambulatory Visit (HOSPITAL_COMMUNITY): Payer: Self-pay | Admitting: Psychiatry

## 2022-12-13 DIAGNOSIS — F99 Mental disorder, not otherwise specified: Secondary | ICD-10-CM

## 2022-12-13 DIAGNOSIS — F4310 Post-traumatic stress disorder, unspecified: Secondary | ICD-10-CM

## 2022-12-13 DIAGNOSIS — F5105 Insomnia due to other mental disorder: Secondary | ICD-10-CM

## 2022-12-13 DIAGNOSIS — F32A Depression, unspecified: Secondary | ICD-10-CM

## 2022-12-13 DIAGNOSIS — F411 Generalized anxiety disorder: Secondary | ICD-10-CM

## 2022-12-15 ENCOUNTER — Other Ambulatory Visit: Payer: Self-pay | Admitting: Student

## 2022-12-15 ENCOUNTER — Other Ambulatory Visit: Payer: Self-pay | Admitting: Hematology and Oncology

## 2022-12-15 DIAGNOSIS — E1159 Type 2 diabetes mellitus with other circulatory complications: Secondary | ICD-10-CM

## 2022-12-15 DIAGNOSIS — E119 Type 2 diabetes mellitus without complications: Secondary | ICD-10-CM | POA: Diagnosis not present

## 2022-12-15 DIAGNOSIS — H02831 Dermatochalasis of right upper eyelid: Secondary | ICD-10-CM | POA: Diagnosis not present

## 2022-12-15 DIAGNOSIS — H43822 Vitreomacular adhesion, left eye: Secondary | ICD-10-CM | POA: Diagnosis not present

## 2022-12-15 DIAGNOSIS — H01132 Eczematous dermatitis of right lower eyelid: Secondary | ICD-10-CM | POA: Diagnosis not present

## 2022-12-15 DIAGNOSIS — H01134 Eczematous dermatitis of left upper eyelid: Secondary | ICD-10-CM | POA: Diagnosis not present

## 2022-12-15 DIAGNOSIS — H02834 Dermatochalasis of left upper eyelid: Secondary | ICD-10-CM | POA: Diagnosis not present

## 2022-12-15 DIAGNOSIS — Z170 Estrogen receptor positive status [ER+]: Secondary | ICD-10-CM

## 2022-12-15 DIAGNOSIS — H01135 Eczematous dermatitis of left lower eyelid: Secondary | ICD-10-CM | POA: Diagnosis not present

## 2022-12-15 DIAGNOSIS — H40053 Ocular hypertension, bilateral: Secondary | ICD-10-CM | POA: Diagnosis not present

## 2022-12-15 DIAGNOSIS — H01131 Eczematous dermatitis of right upper eyelid: Secondary | ICD-10-CM | POA: Diagnosis not present

## 2022-12-15 DIAGNOSIS — H0102B Squamous blepharitis left eye, upper and lower eyelids: Secondary | ICD-10-CM | POA: Diagnosis not present

## 2022-12-15 DIAGNOSIS — H2513 Age-related nuclear cataract, bilateral: Secondary | ICD-10-CM | POA: Diagnosis not present

## 2022-12-15 LAB — HM DIABETES EYE EXAM

## 2022-12-19 NOTE — Progress Notes (Signed)
No PA needed for insurance. Document scanned into media.  Cardiology pre op 12/28

## 2022-12-24 ENCOUNTER — Other Ambulatory Visit: Payer: Medicaid Other

## 2022-12-24 DIAGNOSIS — Z87440 Personal history of urinary (tract) infections: Secondary | ICD-10-CM | POA: Diagnosis not present

## 2022-12-24 LAB — URINALYSIS
Bilirubin, UA: NEGATIVE
Glucose, UA: NEGATIVE mg/dL
Ketones, UA: NEGATIVE
Nitrite, UA: NEGATIVE
Protein, UA: POSITIVE — AB
Spec Grav, UA: 1.015 (ref 1.010–1.025)
Urobilinogen, UA: 1 E.U./dL
pH, UA: 6.5 (ref 5.0–8.0)

## 2022-12-24 NOTE — Progress Notes (Addendum)
Cardiology Office Note:    Date:  12/25/2022   ID:  [name], DOB 1961/10/12, MRN [id number]  PCP:  [name], MD   [facility] Providers Cardiologist:  Lauree Chandler, MD     Referring MD: [name], MD   Chief Complaint: Preoperative cardiac evaluation  History of Present Illness:    [name] is a very pleaseant 61 y.o. female with a hx of severe aortic stenosis s/p TAVR 2015, CAD s/p DES dRCA 01/2009, repeat cath 2015 70% ostial RCA treated medically, no obstructive disease in the LAD or Cx, OSA, HTN, hyperlipidemia, obesity, GERD, asthma, former tobacco abuse, COPD, depression. She had a knee replacement in 3154 complicated by PE, treated with Coumadin for 6 months.  She underwent TAVR with 26 mm SAPIEN prosthetic valve on 12/12/2014.  Echo 12/2020 revealed normal LVEF 60 to 65%, mild LVH, determinate diastolic parameters, normal RV size and function, mild mitral stenosis, aortic valve s/p TAVR present, funtioning well with mean gradient 15 mmHg.  Today, she is here for preoperative cardiac evaluation for upcoming cystoscopy with right retrograde pyelogram, right ureteroscopy with laser right ureteral stent exchange with request to hold Plavix x 5 days. Reports DOE that she attributes to her weight, although it persists over the previous 2 years despite 60 lb weight loss. Denies chest pain, palpitations, PND, presyncope, syncope. Has orthopnea if her head slides off her 2 pillows. Is not very active. Uses motorized scooter for walking long distances. Does her house work on a stool, cannot mop or vacuum. Was given oxygen in the past for sleep but no longer uses it, no CPAP. Is working on reducing portion sizes and increasing physical activity.    Past Medical History:  Diagnosis Date   Anemia    Anxiety    Aortic valve stenosis, severe    Arthritis    PAIN AND SEVERE OA LEFT KNEE ; S/P RIGHT TKA ON 02/03/12; HAS LOWER BACK PAIN-UNABLE TO  STAND MORE THAN 10 MIN; ARTHRITIS "ALL OVER"   Asthma    Blood transfusion    [facility]   Breast cancer in female [facility])    Right   CAD (coronary artery disease)    Cath 2010 with DES x 1 RCA-- PT'S CARDIOLOGIST IS DR. MCALHANY   Chronic diastolic congestive heart failure (HCC)    COPD (chronic obstructive pulmonary disease) (HCC)    Depression    Diabetes mellitus DIAGNOSED IN2010   Dyspnea    with much ambulation   Eczema    on back   Headache    migraines younger- rare now 02/07/21   Heart murmur    no current problems   History of hiatal hernia    History of kidney stones    passed or blasted   Hyperlipidemia    Hypertension    Morbid obesity with body mass index of 60.0-69.9 in adult [facility])    Myocardial infarction ([place])    PT THINKS SHE WAS DX WITH MI AT THE TIME OF HEART STENTING   Neuromuscular disorder (HCC)    bilateral arm/hands   Oxygen dependent    uses 3L oxygen night/prn   Personal history of radiation therapy    Pneumonia    Pulmonary embolism ([place]) 02/08/2012   S/P RT TOTAL KNEE ON 02/03/12--ON 02/08/12--DEVELOPED ACUTE SOB AND CHEST PAIN--AND DIAGNOSED WITH  PULMONARY EMBOLUS AND PNEUMONIA   Restless leg syndrome    Sleep apnea    uses 3 liters O2 at night  as needed   Uterine fibroid    NO PROBLEMS AT PRESENT FROM THE FIBROIDS-STATES SHE IS POST MENOPAUSAL-LAST MENSES 2010 EXCEPT FOR EPISODE THIS YR OF BLEEDING RELATED TO FIBROIDS.   Weakness    BOTH HANDS - S/P BILATERAL CARPAL TUNNEL RELEASE--BUT STILL HAS WEAKNESS--OFTEN DROPS THINGS    Past Surgical History:  Procedure Laterality Date   BACK SURGERY  02/11/2021   Dr Louanne Skye   BREAST BIOPSY Right 06/04/2018   BREAST LUMPECTOMY Right 06/2018   BREAST LUMPECTOMY WITH RADIOACTIVE SEED AND SENTINEL LYMPH NODE BIOPSY Right 07/19/2018   Procedure: BREAST LUMPECTOMY WITH RADIOACTIVE SEED AND SENTINEL LYMPH NODE BIOPSY;  Surgeon: Alphonsa Overall, MD;  Location: [place];  Service: General;  Laterality: Right;    CARDIAC CATHETERIZATION     CARDIAC VALVE REPLACEMENT     2017   CARPAL TUNNEL RELEASE     Bilateral   CHOLECYSTECTOMY     COLONOSCOPY     CORONARY ANGIOPLASTY     2010 has stent in place   CYSTOSCOPY W/ RETROGRADES Right 09/21/2013   Procedure: CYSTOSCOPY WITH RIGHT RETROGRADE PYELOGRAM RIGHT DOUBLE J STENT ;  Surgeon: Fredricka Bonine, MD;  Location: [facility];  Service: Urology;  Laterality: Right;   CYSTOSCOPY W/ URETERAL STENT PLACEMENT Right 11/16/2022   Procedure: CYSTOSCOPY WITH RETROGRADE PYELOGRAM/URETERAL STENT PLACEMENT;  Surgeon: Vira Agar, MD;  Location: [place];  Service: Urology;  Laterality: Right;   CYSTOSCOPY WITH URETEROSCOPY AND STENT PLACEMENT Right 10/25/2013   Procedure: CYSTOSCOPY RIGHT URETEROSCOPY HOLMIUM LASER LITHO AND STENT PLACEMENT;  Surgeon: Fredricka Bonine, MD;  Location[facility];  Service: Urology;  Laterality: Right;   HERNIA REPAIR     INTRAOPERATIVE TRANSESOPHAGEAL ECHOCARDIOGRAM N/A 12/12/2014   Procedure: INTRAOPERATIVE TRANSESOPHAGEAL ECHOCARDIOGRAM;  Surgeon: Burnell Blanks, MD;  Location: [place];  Service: Cardiovascular;  Laterality: N/A;   JOINT REPLACEMENT     bil total knees   KNEE ARTHROPLASTY  02/03/2012   Procedure: COMPUTER ASSISTED TOTAL KNEE ARTHROPLASTY;  Surgeon: Mcarthur Rossetti, MD;  Location: [place];  Service: Orthopedics;  Laterality: Right;  Right total knee arthroplasty   LEFT AND RIGHT HEART CATHETERIZATION WITH CORONARY ANGIOGRAM N/A 03/17/2013   Procedure: LEFT AND RIGHT HEART CATHETERIZATION WITH CORONARY ANGIOGRAM;  Surgeon: Burnell Blanks, MD;  Location: [facility] C[facility] LAB;  Service: Cardiovascular;  Laterality: N/A;   LEFT AND RIGHT HEART CATHETERIZATION WITH CORONARY/GRAFT ANGIOGRAM N/A 09/14/2014   Procedure: LEFT AND RIGHT HEART CATHETERIZATION WITH Beatrix Fetters;  Surgeon: Burnell Blanks, MD;  Location: [facility] C[facility] LAB;  Service: Cardiovascular;  Laterality: N/A;   REVERSE  SHOULDER ARTHROPLASTY Left 03/06/2022   Procedure: LEFT SHOULDER REPLACEMENT APPLICATION OF WOUND VAC;  Surgeon: [name], MD;  Location: [place];  Service: Orthopedics;  Laterality: Left;   TEE WITHOUT CARDIOVERSION N/A 03/14/2013   Procedure: TRANSESOPHAGEAL ECHOCARDIOGRAM (TEE);  Surgeon: Lelon Perla, MD;  Location: [facility] ENDOSCOPY;  Service: Cardiovascular;  Laterality: N/A;   TEE WITHOUT CARDIOVERSION N/A 11/14/2014   Procedure: TRANSESOPHAGEAL ECHOCARDIOGRAM (TEE);  Surgeon: Lelon Perla, MD;  Location: [facility] ENDOSCOPY;  Service: Cardiovascular;  Laterality: N/A;   TONSILLECTOMY     maybe as a child- does not know   TOTAL KNEE ARTHROPLASTY  09/10/2012   Procedure: TOTAL KNEE ARTHROPLASTY;  Surgeon: Mcarthur Rossetti, MD;  [facility];  Service: Orthopedics;  Laterality: Left;   TOTAL KNEE REVISION Right 07/15/2013   Procedure: REVISION ARTHROPLASTY RIGHT KNEE;  Surgeon: Mcarthur Rossetti, MD;  [facility];  Service: Orthopedics;  Laterality: Right;   TRANSCATHETER AORTIC VALVE REPLACEMENT, TRANSFEMORAL N/A 12/12/2014   Procedure: TRANSCATHETER AORTIC VALVE REPLACEMENT, TRANSFEMORAL;  Surgeon: Burnell Blanks, MD;  Location: [place];  Service: Cardiovascular;  Laterality: N/A;   TRIGGER FINGER RELEASE  09/10/2012   Procedure: RELEASE TRIGGER FINGER/A-1 PULLEY;  Surgeon: Mcarthur Rossetti, MD;  [facility];  Service: Orthopedics;  Laterality: Right;  Right Ring Finger   TUBAL LIGATION      Current Medications: Current Meds  Medication Sig   Accu-Chek FastClix Lancets MISC Check blood sugar two times a day   albuterol (PROVENTIL) (2.5 MG/3ML) 0.083% nebulizer solution USE 1 VIAL IN NEBULIZER EVERY 4 HOURS AS NEEDED FOR WHEEZING OR SHORTNESS OF BREATH (Patient taking differently: Take 2.5 mg by nebulization every 4 (four) hours as needed for wheezing or shortness of breath.)   amlodipine-atorvastatin (CADUET) 5-80 MG tablet Take 1 tablet by mouth  daily.   ASPIRIN ADULT LOW STRENGTH 81 MG EC tablet TAKE 1 Tablet BY MOUTH ONCE DAILY   B-D UF III MINI PEN NEEDLES 31G X 5 MM MISC USE AS DIRECTED DAILY   Blood Glucose Monitoring Suppl (ACCU-CHEK GUIDE) w/Device KIT 1 each by Does not apply route 2 (two) times daily.   busPIRone (BUSPAR) 10 MG tablet Take 1 tablet (10 mg total) by mouth 3 (three) times daily. (Patient taking differently: Take 10 mg by mouth 2 (two) times daily.)   citalopram (CELEXA) 20 MG tablet TAKE 1 TABLET BY MOUTH ONCE DAILY. DO NOT TAKE WHILE TAKING DULOXETINE (Patient taking differently: Take 20 mg by mouth daily. TAKE 1 TABLET BY MOUTH ONCE DAILY. DO NOT TAKE WHILE TAKING DULOXETINE)   clopidogrel (PLAVIX) 75 MG tablet TAKE 1 TABLET BY MOUTH IN THE MORNING (Patient taking differently: Take 75 mg by mouth daily.)   Continuous Blood Gluc Sensor (FREESTYLE LIBRE 3 SENSOR) MISC Place 1 sensor on the skin every 14 days. Use to check glucose continuously   Dulaglutide (TRULICITY) 4.5 DT/2.6ZT SOPN Inject 4.5 mg as directed once a week.   Evolocumab (REPATHA SURECLICK) 245 MG/ML SOAJ INJECT 1 PEN INTO THE SKIN EVERY 14 DAYS (Patient taking differently: Inject 140 mg into the skin every 14 (fourteen) days.)   exemestane (AROMASIN) 25 MG tablet TAKE 1 TABLET BY MOUTH ONCE DAILY AFTER BREAKFAST   ferrous sulfate 325 (65 FE) MG tablet Take 1 tablet (325 mg total) by mouth every other day.   fluticasone (FLONASE) 50 MCG/ACT nasal spray Place 1 spray into both nostrils daily. Begin by using 2 sprays in each nare daily for 3 to 5 days, then decrease to 1 spray in each nare daily.   Fluticasone-Umeclidin-Vilant (TRELEGY ELLIPTA) 100-62.5-25 MCG/ACT AEPB INHALE 1 PUFF ONCE DAILY Strength: 100-62.5-25 MCG/ACT (Patient taking differently: Inhale 1 puff into the lungs 2 (two) times daily.)   furosemide (LASIX) 40 MG tablet Take 1 tablet (40 mg total) by mouth as needed for fluid or edema. (Patient taking differently: Take 40 mg by mouth every  other day.)   gabapentin (NEURONTIN) 300 MG capsule TAKE 2 CAPSULES BY MOUTH IN THE MORNING AND TAKE 3 CAPSULES AT BEDTIME   glucose blood (ACCU-CHEK GUIDE) test strip Check blood sugar 2 times per day   insulin glargine-yfgn (SEMGLEE) 100 UNIT/ML Pen Inject 30 Units into the skin at bedtime.   LANTUS SOLOSTAR 100 UNIT/ML Solostar Pen Inject 30 Units into the skin at bedtime.   metFORMIN (GLUCOPHAGE-XR) 500 MG 24 hr tablet Take  1 tablet (500 mg total) by mouth 2 (two) times daily.   methocarbamol (ROBAXIN) 500 MG tablet Take 1 tablet (500 mg total) by mouth every 6 (six) hours as needed for muscle spasms.   metoprolol succinate (TOPROL-XL) 50 MG 24 hr tablet TAKE 1 TABLET BY MOUTH ONCE DAILY WITH MEALS OR  IMMEDIATELY  FOLLOWING  A  MEAL   montelukast (SINGULAIR) 10 MG tablet Take 1 tablet (10 mg total) by mouth at bedtime.   nitrofurantoin, macrocrystal-monohydrate, (MACROBID) 100 MG capsule Take 1 capsule (100 mg total) by mouth daily.   senna-docusate (SENNA-TIME S) 8.6-50 MG tablet Take 1 tablet by mouth at bedtime as needed for mild constipation.   traMADol (ULTRAM) 50 MG tablet Take 1 tablet (50 mg total) by mouth every 6 (six) hours as needed. (Patient taking differently: Take 50 mg by mouth every 6 (six) hours as needed for moderate pain or severe pain.)   traZODone (DESYREL) 50 MG tablet TAKE 1/2 TO 1 (ONE-HALF TO ONE) TABLET BY MOUTH AT BEDTIME   VENTOLIN HFA 108 (90 Base) MCG/ACT inhaler INHALE 2 PUFFS BY MOUTH EVERY 6 HOURS AS NEEDED FOR WHEEZING FOR SHORTNESS OF BREATH (Patient taking differently: Inhale 2 puffs into the lungs every 6 (six) hours as needed for wheezing or shortness of breath.)     Allergies:   Patient has no known allergies.   Social History   Socioeconomic History   Marital status: Married    Spouse name: Not on file   Number of children: 2   Years of education: Not on file   Highest education level: Not on file  Occupational History   Occupation: Disabled   Tobacco Use   Smoking status: Former    Packs/day: 1.50    Years: 30.00    Total pack years: 45.00    Types: Cigarettes    Quit date: 12/29/2000    Years since quitting: 22.0   Smokeless tobacco: Never  Vaping Use   Vaping Use: Never used  Substance and Sexual Activity   Alcohol use: Not Currently   Drug use: Not Currently    Types: Marijuana    Comment: CBD oils through vape shops   Sexual activity: Not Currently    Birth control/protection: Surgical, Post-menopausal    Comment: tubal ligation  Other Topics Concern   Not on file  Social History Narrative   Not on file   Social Determinants of Health   Financial Resource Strain: Low Risk  (12/08/2022)   Overall Financial Resource Strain (CARDIA)    Difficulty of Paying Living Expenses: Not very hard  Food Insecurity: No Food Insecurity (11/16/2022)   Hunger Vital Sign    Worried About Running Out of Food in the Last Year: Never true    [place] in the Last Year: Never true  Transportation Needs: No Transportation Needs (11/16/2022)   PRAPARE - Hydrologist (Medical): No    Lack of Transportation (Non-Medical): No  Physical Activity: Inactive (11/07/2022)   Exercise Vital Sign    Days of Exercise per Week: 0 days    Minutes of Exercise per Session: 0 min  Stress: Stress Concern Present (11/07/2022)   [place]    Feeling of Stress : To some extent  Social Connections: Moderately Isolated (11/07/2022)   Social Connection and Isolation Panel [NHANES]    Frequency of Communication with Friends and Family: More than three times a week  Frequency of Social Gatherings with Friends and Family: More than three times a week    Attends Religious Services: Never    Marine scientist or Organizations: No    Attends Music therapist: Never    Marital Status: Married     Family History: The patient's family  history includes Asthma in her father; Breast cancer in her mother; COPD in her father; Cancer in her brother; Emphysema in her mother; Heart disease in her father and mother.  ROS:   Please see the history of present illness.    + DOE All other systems reviewed and are negative.  Labs/Other Studies Reviewed:    The following studies were reviewed today:  Echo 01/23/21 1. Left ventricular ejection fraction, by estimation, is 60 to 65%. The  left ventricle has normal function. The left ventricle has no regional  wall motion abnormalities. There is mild concentric left ventricular  hypertrophy. Left ventricular diastolic  parameters are indeterminate.   2. Right ventricular systolic function is normal. The right ventricular  size is normal.   3. The mitral valve is abnormal. No evidence of mitral valve  regurgitation. Mild mitral stenosis. The mean mitral valve gradient is 4.0  mmHg with average heart rate of 77 bpm.   4. The aortic valve has been repaired/replaced. Aortic valve  regurgitation is not visualized. 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 Procedure Date: 12/12/2014. Aortic  valve mean gradient measures 15.0 mmHg.   5. The inferior vena cava is normal in size with greater than 50%  respiratory variability, suggesting right atrial pressure of 3 mmHg.  Cardiac cath 09/14/14: Left main: No obstructive disease.  Left Anterior Descending Artery: Large caliber vessel that courses to the apex. There is a moderate caliber diagonal branch. No obstructive disease noted.  Circumflex Artery: Large caliber vessel with two small obtuse marginal branches and a moderate caliber posterolateral branch. No obstructive disease noted.  Right Coronary Artery: Large dominant vessel with 70-80% ostial stenosis. 40% proximal stenosis. Distal stented segment is patent with no restenosis.  Left Ventricular Angiogram: Deferred.   Recent Labs: 11/16/2022: ALT  13 11/18/2022: Magnesium 1.8 11/19/2022: BUN 15; Creatinine, Ser 0.95; Hemoglobin 11.7; Platelets 148; Potassium 4.2; Sodium 140  Recent Lipid Panel    Component Value Date/Time   CHOL 164 06/04/2022 1541   TRIG 131 06/04/2022 1541   HDL 54 06/04/2022 1541   CHOLHDL 3.0 06/04/2022 1541   CHOLHDL 3.7 12/28/2015 1052   VLDL 20 12/28/2015 1052   LDLCALC 87 06/04/2022 1541   LDLDIRECT 162.0 10/17/2013 0905     Risk Assessment/Calculations:         Physical Exam:    VS:  BP 124/60   Pulse 84   Ht _0  (1.6 m)   Wt (!) 305 lb 12.8 oz (138.7 kg)   LMP 02/03/2012   SpO2 94%   BMI 54.17 kg/m     Wt Readings from Last 3 Encounters:  12/25/22 (!) 305 lb 12.8 oz (138.7 kg)  12/05/22 (!) 303 lb (137.4 kg)  12/03/22 (!) 305 lb 3.2 oz (138.4 kg)     GEN: Obese in no acute distress HEENT: Normal NECK: No JVD; No carotid bruits CARDIAC: RRR, no murmurs, rubs, gallops RESPIRATORY:  Clear to auscultation without rales, wheezing or rhonchi  ABDOMEN: Soft, non-tender, non-distended MUSCULOSKELETAL:  No edema; No deformity. 2+ pedal pulses, equal bilaterally SKIN: Warm and dry NEUROLOGIC:  Alert and  oriented x 3 PSYCHIATRIC:  Normal affect   EKG:  EKG is not ordered today   Diagnoses:    1. Preoperative clearance   2. History of transcatheter aortic valve replacement (TAVR)   3. Coronary artery disease involving native coronary artery of native heart without angina pectoris   4. Hyperlipidemia LDL goal <70   5. DOE (dyspnea on exertion)   6. Morbid obesity ([place])   7. Nonrheumatic aortic valve stenosis   8. Essential hypertension    Assessment and Plan:     Preoperative cardiac evaluation: She is having symptoms of DOE.  This has been persistent for many years but she questions if it is anginal equivalent. Unable to achieve > 4 METS activity. According to the Revised Cardiac Risk Index (RCRI), her Perioperative Risk of Major Cardiac Event is (%): 11. Her Functional  Capacity in METs is: 3.41 according to the Duke Activity Status Index (DASI). We will get Lexiscan Myoview to evaluate for ischemia. Will await test results for determination to hold Plavix. Addendum: Low risk nuclear stress test 01/01/2023.  She may proceed with surgery without further testing and may hold Plavix for 5 days prior to procedure. Plavix should be resumed as soon as hemodynamically stable following the procedure.   CAD: History of remote stent to RCA.  Repeat cath 08/2014 with patent stent to RCA, moderate ostial RCA stenosis, no disease in LAD or circumflex. She questions if chronic DOE is anginal equivalent. Symptoms prior to previous stent were chest pain and DOE.  Will get Lexiscan Myoview to evaluate for ischemia.  No bleeding problems on Plavix. Continue Plavix, Caduet, ezetimibe, Repatha, metoprolol.  Hypertension: BP is well-controlled.  Hyperlipidemia LDL goal < 70: LDL 87 on 06/04/22.  Reports compliance with atorvastatin, Repatha, ezetimibe. Is working on improving her diet. We will recheck today.   Severe aortic stenosis s/p TAVR: Hx TAVR 12/12/2014.  Normal valve function on echo 12/2020 with mean gradient 15 mmHg. Has chronic DOE. No palpitations, chest pain, presyncope, syncope. I do not appreciate a significant murmur on exam today. We will repeat echo for evaluation of valve. This does not have to be completed prior to upcoming surgery.   Morbid obesity: History of 60 lb weight loss over 2 years. Weight has remained consistent at 305 lb for some time. Encouraged her to continue weight loss efforts. She specifically plans to decrease portion sizes, eat a healthier diet, and increase physical activity.   Shared Decision Making/Informed Consent The risks [chest pain, shortness of breath, cardiac arrhythmias, dizziness, blood pressure fluctuations, myocardial infarction, stroke/transient ischemic attack, nausea, vomiting, allergic reaction, radiation exposure, metallic taste  sensation and life-threatening complications (estimated to be 1 in 10,000)], benefits (risk stratification, diagnosing coronary artery disease, treatment guidance) and alternatives of a nuclear stress test were discussed in detail with Ms. Hagan and she agrees to proceed.   Disposition: 1 year with Dr. Angelena Form  Medication Adjustments/Labs and Tests Ordered: Current medicines are reviewed at length with the patient today.  Concerns regarding medicines are outlined above.  Orders Placed This Encounter  Procedures   Lipid Profile   Hepatic function panel   Cardiac Stress Test: Informed Consent Details: Physician/Practitioner Attestation; Transcribe to consent form and obtain patient signature   Myocardial Perfusion Imaging   ECHOCARDIOGRAM COMPLETE   No orders of the defined types were placed in this encounter.   Patient Instructions  Medication Instructions:   Your physician recommends that you continue on your current medications as directed. Please refer  to the Current Medication list given to you today.   *If you need a refill on your cardiac medications before your next appointment, please call your pharmacy*   Lab Work:  TODAY !!!!! LIPID/LFT!!  If you have labs (blood work) drawn today and your tests are completely normal, you will receive your results only by: [place] (if you have MyChart) OR A paper copy in the mail If you have any lab test that is abnormal or we need to change your treatment, we will call you to review the results.   Testing/Procedures:  Your physician has requested that you have an echocardiogram. Echocardiography is a painless test that uses sound waves to create images of your heart. It provides your doctor with information about the size and shape of your heart and how well your heart's chambers and valves are working. This procedure takes approximately one hour. There are no restrictions for this procedure. Please do NOT wear cologne,  perfume, aftershave, or lotions (deodorant is allowed). Please arrive 15 minutes prior to your appointment time.  You are scheduled for a Myocardial Perfusion Imaging Study on Wednesday, January 3 at 10:15.   Please arrive 15 minutes prior to your appointment time for registration and insurance purposes.   The test will take approximately 3 to 4 hours to complete; you may bring reading material. If someone comes with you to your appointment, they will need to remain in the main lobby due to limited space in the testing area.   How to prepare for your Myocardial Perfusion test:   Do not eat or drink 3 hours prior to your test, except you may have water.    Do not consume products containing caffeine (regular or decaffeinated) 12 hours prior to your test (ex: coffee, chocolate, soda, tea)   Do bring a list of your current medications with you. If not listed below, you may take your medications as normal.   Bring any held medication to your appointment, as you may be required to take it once the test is complete.   Do wear comfortable clothes (no dresses) and walking shoes. Tennis shoes are preferred. No heels or open toed shoes.  Do not wear perfume or lotions (deodorant is allowed).   If these instructions are not followed, you test will have to be rescheduled.   Please report to [address] for your test. If you have questions or concerns about your appointment, please call the Nuclear Lab at [phone].  If you cannot keep your appointment, please provide 24 hour notification to the Nuclear lab to avoid a possible $50 charge to your account.        Follow-Up: At [facility], you and your health needs are our priority.  As part of our continuing mission to provide you with exceptional heart care, we have created designated Provider Care Teams.  These Care Teams include your primary Cardiologist (physician) and Advanced Practice Providers (APPs -   Physician Assistants and Nurse Practitioners) who all work together to provide you with the care you need, when you need it.  We recommend signing up for the patient portal called "MyChart".  Sign up information is provided on this After Visit Summary.  MyChart is used to connect with patients for Virtual Visits (Telemedicine).  Patients are able to view lab/test results, encounter notes, upcoming appointments, etc.  Non-urgent messages can be sent to your provider as well.   To learn more about what you can  do with MyChart, go to [email or web address].    Your next appointment:   1 year(s)  The format for your next appointment:   In Person  Provider:   Lauree Chandler, MD     Other Instructions  Your physician wants you to follow-up in: 1 year with Dr. Angelena Form.  You will receive a reminder letter in the mail two months in advance. If you don't receive a letter, please call our office to schedule the follow-up appointment.   Important Information About Sugar         Signed, Emmaline Life, NP  12/25/2022 12:20 PM    [place]

## 2022-12-25 ENCOUNTER — Encounter: Payer: Self-pay | Admitting: Nurse Practitioner

## 2022-12-25 ENCOUNTER — Telehealth (HOSPITAL_COMMUNITY): Payer: Self-pay | Admitting: *Deleted

## 2022-12-25 ENCOUNTER — Ambulatory Visit: Payer: Medicaid Other | Attending: Nurse Practitioner | Admitting: Nurse Practitioner

## 2022-12-25 VITALS — BP 124/60 | HR 84 | Ht 63.0 in | Wt 305.8 lb

## 2022-12-25 DIAGNOSIS — R0609 Other forms of dyspnea: Secondary | ICD-10-CM

## 2022-12-25 DIAGNOSIS — Z01818 Encounter for other preprocedural examination: Secondary | ICD-10-CM | POA: Diagnosis not present

## 2022-12-25 DIAGNOSIS — Z952 Presence of prosthetic heart valve: Secondary | ICD-10-CM | POA: Diagnosis not present

## 2022-12-25 DIAGNOSIS — I350 Nonrheumatic aortic (valve) stenosis: Secondary | ICD-10-CM

## 2022-12-25 DIAGNOSIS — I10 Essential (primary) hypertension: Secondary | ICD-10-CM

## 2022-12-25 DIAGNOSIS — I2510 Atherosclerotic heart disease of native coronary artery without angina pectoris: Secondary | ICD-10-CM | POA: Diagnosis not present

## 2022-12-25 DIAGNOSIS — E785 Hyperlipidemia, unspecified: Secondary | ICD-10-CM | POA: Diagnosis not present

## 2022-12-25 LAB — URINE CULTURE: Organism ID, Bacteria: NO GROWTH

## 2022-12-25 NOTE — Patient Instructions (Signed)
Medication Instructions:   Your physician recommends that you continue on your current medications as directed. Please refer to the Current Medication list given to you today.   *If you need a refill on your cardiac medications before your next appointment, please call your pharmacy*   Lab Work:  TODAY !!!!! LIPID/LFT!!  If you have labs (blood work) drawn today and your tests are completely normal, you will receive your results only by: [place] (if you have MyChart) OR A paper copy in the mail If you have any lab test that is abnormal or we need to change your treatment, we will call you to review the results.   Testing/Procedures:  Your physician has requested that you have an echocardiogram. Echocardiography is a painless test that uses sound waves to create images of your heart. It provides your doctor with information about the size and shape of your heart and how well your heart's chambers and valves are working. This procedure takes approximately one hour. There are no restrictions for this procedure. Please do NOT wear cologne, perfume, aftershave, or lotions (deodorant is allowed). Please arrive 15 minutes prior to your appointment time.  You are scheduled for a Myocardial Perfusion Imaging Study on Wednesday, January 3 at 10:15.   Please arrive 15 minutes prior to your appointment time for registration and insurance purposes.   The test will take approximately 3 to 4 hours to complete; you may bring reading material. If someone comes with you to your appointment, they will need to remain in the main lobby due to limited space in the testing area.   How to prepare for your Myocardial Perfusion test:   Do not eat or drink 3 hours prior to your test, except you may have water.    Do not consume products containing caffeine (regular or decaffeinated) 12 hours prior to your test (ex: coffee, chocolate, soda, tea)   Do bring a list of your current medications with you.  If not listed below, you may take your medications as normal.   Bring any held medication to your appointment, as you may be required to take it once the test is complete.   Do wear comfortable clothes (no dresses) and walking shoes. Tennis shoes are preferred. No heels or open toed shoes.  Do not wear perfume or lotions (deodorant is allowed).   If these instructions are not followed, you test will have to be rescheduled.   Please report to [address] for your test. If you have questions or concerns about your appointment, please call the Nuclear Lab at [phone].  If you cannot keep your appointment, please provide 24 hour notification to the Nuclear lab to avoid a possible $50 charge to your account.        Follow-Up: At [facility], you and your health needs are our priority.  As part of our continuing mission to provide you with exceptional heart care, we have created designated Provider Care Teams.  These Care Teams include your primary Cardiologist (physician) and Advanced Practice Providers (APPs -  Physician Assistants and Nurse Practitioners) who all work together to provide you with the care you need, when you need it.  We recommend signing up for the patient portal called "MyChart".  Sign up information is provided on this After Visit Summary.  MyChart is used to connect with patients for Virtual Visits (Telemedicine).  Patients are able to view lab/test results, encounter notes, upcoming appointments, etc.  Non-urgent messages  can be sent to your provider as well.   To learn more about what you can do with MyChart, go to [email or web address].    Your next appointment:   1 year(s)  The format for your next appointment:   In Person  Provider:   Lauree Chandler, MD     Other Instructions  Your physician wants you to follow-up in: 1 year with Dr. Angelena Form.  You will receive a reminder letter in the mail two months in advance.  If you don't receive a letter, please call our office to schedule the follow-up appointment.   Important Information About Sugar

## 2022-12-25 NOTE — Telephone Encounter (Signed)
Left message on voicemail per DPR in reference to upcoming appointment scheduled on 12/31/2022 at 10:15 with detailed instructions given per Myocardial Perfusion Study Information Sheet for the test. LM to arrive 15 minutes early, and that it is imperative to arrive on time for appointment to keep from having the test rescheduled. If you need to cancel or reschedule your appointment, please call the office within 24 hours of your appointment. Failure to do so may result in a cancellation of your appointment, and a $50 no show fee. Phone number given for call back for any questions.

## 2022-12-26 ENCOUNTER — Telehealth: Payer: Self-pay

## 2022-12-26 LAB — LIPID PANEL
Chol/HDL Ratio: 3.4 ratio (ref 0.0–4.4)
Cholesterol, Total: 165 mg/dL (ref 100–199)
HDL: 49 mg/dL (ref >39)
LDL Chol Calc (NIH): 101 mg/dL — ABNORMAL HIGH (ref 0–99)
Triglycerides: 78 mg/dL (ref 0–149)
VLDL Cholesterol Cal: 15 mg/dL (ref 5–40)

## 2022-12-26 LAB — HEPATIC FUNCTION PANEL
ALT: 9 IU/L (ref 0–32)
AST: 12 IU/L (ref 0–40)
Albumin: 4.1 g/dL (ref 3.9–4.9)
Alkaline Phosphatase: 76 IU/L (ref 44–121)
Bilirubin Total: 0.8 mg/dL (ref 0.0–1.2)
Bilirubin, Direct: 0.22 mg/dL (ref 0.00–0.40)
Total Protein: 6.4 g/dL (ref 6.0–8.5)

## 2022-12-26 NOTE — Telephone Encounter (Signed)
-----   Message from Primus Bravo, MD sent at 12/26/2022  8:55 AM EST ----- Please notify the patient that her urine culture does not show any evidence of UTI. I would like for her to continue on the daily Macrobid until after her surgery.  She should have a 30 day supply with refills if necessary.

## 2022-12-26 NOTE — Telephone Encounter (Signed)
Notified pt as advised, pt expressed understanding.  ?

## 2022-12-29 DIAGNOSIS — Z419 Encounter for procedure for purposes other than remedying health state, unspecified: Secondary | ICD-10-CM | POA: Diagnosis not present

## 2022-12-31 ENCOUNTER — Ambulatory Visit (HOSPITAL_COMMUNITY): Payer: Medicaid Other | Attending: Nurse Practitioner

## 2022-12-31 DIAGNOSIS — E785 Hyperlipidemia, unspecified: Secondary | ICD-10-CM | POA: Insufficient documentation

## 2022-12-31 DIAGNOSIS — Z01818 Encounter for other preprocedural examination: Secondary | ICD-10-CM | POA: Diagnosis not present

## 2022-12-31 DIAGNOSIS — Z952 Presence of prosthetic heart valve: Secondary | ICD-10-CM | POA: Insufficient documentation

## 2022-12-31 DIAGNOSIS — I2510 Atherosclerotic heart disease of native coronary artery without angina pectoris: Secondary | ICD-10-CM | POA: Diagnosis not present

## 2022-12-31 DIAGNOSIS — R0609 Other forms of dyspnea: Secondary | ICD-10-CM | POA: Insufficient documentation

## 2022-12-31 MED ORDER — REGADENOSON 0.4 MG/5ML IV SOLN
0.4000 mg | Freq: Once | INTRAVENOUS | Status: AC
  Administered 2022-12-31: 0.4 mg via INTRAVENOUS

## 2022-12-31 MED ORDER — TECHNETIUM TC 99M TETROFOSMIN IV KIT
31.9000 | PACK | Freq: Once | INTRAVENOUS | Status: AC | PRN
  Administered 2022-12-31: 31.9 via INTRAVENOUS

## 2022-12-31 NOTE — Progress Notes (Signed)
Per cardiology pre op visit- patient to have Lexascan prior to medication approval hold.

## 2023-01-01 ENCOUNTER — Ambulatory Visit (HOSPITAL_COMMUNITY): Payer: Medicaid Other | Attending: Internal Medicine

## 2023-01-01 LAB — MYOCARDIAL PERFUSION IMAGING
Base ST Depression (mm): 0 mm
LV dias vol: 80 mL (ref 46–106)
LV sys vol: 32 mL
Nuc Stress EF: 60 %
Peak HR: 88 {beats}/min
Rest HR: 76 {beats}/min
Rest Nuclear Isotope Dose: 32.7 mCi
SDS: 2
SRS: 0
SSS: 2
ST Depression (mm): 0 mm
Stress Nuclear Isotope Dose: 31.9 mCi
TID: 1.1

## 2023-01-01 MED ORDER — TECHNETIUM TC 99M TETROFOSMIN IV KIT
32.7000 | PACK | Freq: Once | INTRAVENOUS | Status: AC | PRN
  Administered 2023-01-01: 32.7 via INTRAVENOUS

## 2023-01-02 ENCOUNTER — Ambulatory Visit (INDEPENDENT_AMBULATORY_CARE_PROVIDER_SITE_OTHER): Payer: Medicaid Other | Admitting: Licensed Clinical Social Worker

## 2023-01-02 ENCOUNTER — Encounter (HOSPITAL_COMMUNITY): Payer: Self-pay

## 2023-01-02 DIAGNOSIS — F4310 Post-traumatic stress disorder, unspecified: Secondary | ICD-10-CM

## 2023-01-02 DIAGNOSIS — F411 Generalized anxiety disorder: Secondary | ICD-10-CM

## 2023-01-02 DIAGNOSIS — F331 Major depressive disorder, recurrent, moderate: Secondary | ICD-10-CM

## 2023-01-02 NOTE — Progress Notes (Signed)
THERAPIST PROGRESS NOTE  Virtual Visit via Video Note  I connected with [name] on 01/02/23 at 11:00 AM EST by a video enabled telemedicine application and verified that I am speaking with the correct person using two identifiers.  Location: Patient: [name]  Provider: [facility]    I discussed the limitations of evaluation and management by telemedicine and the availability of in person appointments. The patient expressed understanding and agreed to proceed.      I discussed the assessment and treatment plan with the patient. The patient was provided an opportunity to ask questions and all were answered. The patient agreed with the plan and demonstrated an understanding of the instructions.   The patient was advised to call back or seek an in-person evaluation if the symptoms worsen or if the condition fails to improve as anticipated.  I provided 30 minutes of non-face-to-face time during this encounter.   [name] Horn, LCSW   Participation Level: Active  Behavioral Response: CasualAlertAnxious and Depressed  Type of Therapy: Individual Therapy  Treatment Goals addressed:  Active     Anxiety Disorder CCP Problem  1 GAD      LTG: Patient will score less than 5 on the Generalized Anxiety Disorder 7 Scale (GAD-7) (Completed/Met)     Start:  06/24/22    Expected End:  01/28/23    Resolved:  12/11/22      STG: Patient will participate in at least 80% of scheduled individual psychotherapy sessions (Progressing)     Start:  06/24/22    Expected End:  10/02/23         STG: Patient will complete at least 80% of assigned homework (Progressing)     Start:  06/24/22    Expected End:  10/02/23         STG: Patient will practice problem solving skills 3 times per week for the next 4 weeks (Progressing)     Start:  06/24/22    Expected End:  10/02/23          Identify 3 triggers for anxiety  (Progressing)     Start:  01/02/23    Expected  End:  10/02/23          Goal Note     Roommates            BH CCP Acute or Chronic Trauma Reaction     STG: Decrease GAD-7 below 5  (Not Progressing)     Start:  01/02/23    Expected End:  10/02/23       Goal Note     Increased above a 5          Decrease PHQ-9 below a 10  (Progressing)     Start:  01/02/23    Expected End:  10/02/23       Goal Note     Increased today however remain below a 10.           Identify three triggers for PTSD      Start:  01/02/23    Expected End:  10/02/23              Depression CCP Problem  1 MDD     LTG: [name] WILL SCORE LESS THAN 10 ON THE PATIENT HEALTH QUESTIONNAIRE (PHQ-9) (Completed/Met)     Start:  06/24/22    Expected End:  01/28/23    Resolved:  12/11/22   Reviewed intervention       STG: [name]  WILL PARTICIPATE IN AT LEAST 80% OF SCHEDULED INDIVIDUAL PSYCHOTHERAPY SESSIONS (Progressing)     Start:  06/24/22    Expected End:  10/02/23         STG: [name] WILL COMPLETE AT LEAST 80% OF ASSIGNED HOMEWORK (Progressing)     Start:  06/24/22    Expected End:  10/02/23          Identify 3 trigger for depression      Start:  01/02/23    Expected End:  10/02/23          Goal Note     Sister relationship with pt.             Interventions: CBT, Motivational Interviewing, and Supportive  Summary: [name] is a 62 y.o. female who presents with depressed and anxious mood\affect.  Patient was pleasant, cooperative, maintained good eye contact.  [name] was alert and oriented x 5.  Patient reports primary stressors as family conflict and illness.  Patient states that her sister sent her nasty "letter".  Patient reports that her sister no longer wishes to talk to her. This is over a conflict of when [name] was in the hospital several weeks ago.  Patient states that this is upsetting because she would like to have a relationship with her sister and does not know what to do.  Intervention:  LCSW spoke with patient about communication techniques to help with patient's conflict with her sister.  This included speaking to patient about listening and empathy.  LCSW spoke with patient about: The 7 Cs stand for: clear, concise, concrete, correct, coherent, complete, and courteous.   Other stressors for patient include illness for her spouse.  Patient reports that he suffers from congestive heart failure.  [name] reports that he struggles sometimes with his breathing and catching his breath.  This has increased patient's anxiety for tension and worry.  Suicidal/Homicidal: Nowithout intent/plan  Therapist Response:     Intervention/Plan: LCSW administered the GAD-7.  LCSW administered a PHQ-9.  LCSW notes an increase in PHQ-9 but still remains below goal of a 10.  LCSW also notes increase from below a 5 or GAD-7 to an 8 today.  Plan for patient is to readminister GAD-7 and PHQ-9 and next session after utilizing communication techniques with her sister.  Plan: Return again in 3 weeks.  Diagnosis: No diagnosis found.  Collaboration of Care: Other None today   Patient/Guardian was advised Release of Information must be obtained prior to any record release in order to collaborate their care with an outside provider. Patient/Guardian was advised if they have not already done so to contact the registration department to sign all necessary forms in order for [place] to release information regarding their care.   Consent: Patient/Guardian gives verbal consent for treatment and assignment of benefits for services provided during this visit. Patient/Guardian expressed understanding and agreed to proceed.   [name] Horn, LCSW 01/02/2023

## 2023-01-02 NOTE — Patient Instructions (Addendum)
________________________________________________________________________  SURGICAL WAITING ROOM VISITATION Patients having surgery or a procedure may have no more than 2 support people in the waiting area - these visitors may rotate in the visitor waiting room.   Due to an increase in RSV and influenza rates and associated hospitalizations, children ages 83 and under may not visit patients in [place]. If the patient needs to stay at the hospital during part of their recovery, the visitor guidelines for inpatient rooms apply.  PRE-OP VISITATION  Pre-op nurse will coordinate an appropriate time for 1 support person to accompany the patient in pre-op.  This support person may not rotate.  This visitor will be contacted when the time is appropriate for the visitor to come back in the pre-op area.  Please refer to the [facility] website for the visitor guidelines for Inpatients (after your surgery is over and you are in a regular room).  You are not required to quarantine at this time prior to your surgery. However, you must do this: Hand Hygiene often Do NOT share personal items Notify your provider if you are in close contact with someone who has COVID or you develop fever 100.4 or greater, new onset of sneezing, cough, sore throat, shortness of breath or body aches.  If you test positive for Covid or have been in contact with anyone that has tested positive in the last 10 days please notify you surgeon.    Your procedure is scheduled on:  Tuesday   January 13, 2023  Report to [facility] Main Entrance: [place] entrance where the [employer] is available.   Report to admitting at:  10:45   AM  +++++Call this number if you have any questions or problems the morning of surgery [phone]  DO NOT EAT OR DRINK ANYTHING AFTER MIDNIGHT THE NIGHT PRIOR TO YOUR SURGERY / PROCEDURE.   FOLLOW BOWEL PREP AND ANY ADDITIONAL PRE OP INSTRUCTIONS YOU RECEIVED FROM YOUR  SURGEON'S OFFICE!!!   Oral Hygiene is also important to reduce your risk of infection.        Remember - BRUSH YOUR TEETH THE MORNING OF SURGERY WITH YOUR REGULAR TOOTHPASTE  Do NOT smoke after Midnight the night before surgery.  Plavix -stop taking 5 days prior to day of surgery_(120 hours)__last dose 1/10/24__as instructed by Christen Bame, NP. Aspirin- already on hold    How to Manage Your Diabetes Before and After Surgery  Why is it important to control my blood sugar before and after surgery? Improving blood sugar levels before and after surgery helps healing and can limit problems. A way of improving blood sugar control is eating a healthy diet by:  Eating less sugar and carbohydrates  Increasing activity/exercise  Talking with your doctor about reaching your blood sugar goals High blood sugars (greater than 180 mg/dL) can raise your risk of infections and slow your recovery, so you will need to focus on controlling your diabetes during the weeks before surgery. Make sure that the doctor who takes care of your diabetes knows about your planned surgery including the date and location.  How do I manage my blood sugar before surgery? Check your blood sugar at least 4 times a day, starting 2 days before surgery, to make sure that the level is not too high or low. Check your blood sugar the morning of your surgery when you wake up and every 2 hours until you get to the Short Stay unit. If your blood sugar is less than 70  mg/dL, you will need to treat for low blood sugar: Do not take insulin. Treat a low blood sugar (less than 70 mg/dL) with  cup of clear juice (cranberry or apple), 4 glucose tablets, OR glucose gel. Recheck blood sugar in 15 minutes after treatment (to make sure it is greater than 70 mg/dL). If your blood sugar is not greater than 70 mg/dL on recheck, call [phone] for further instructions. Report your blood sugar to the short stay nurse when you get to Short  Stay.  If you are admitted to the hospital after surgery: Your blood sugar will be checked by the staff and you will probably be given insulin after surgery (instead of oral diabetes medicines) to make sure you have good blood sugar levels. The goal for blood sugar control after surgery is 80-180 mg/dL.   WHAT DO I DO ABOUT MY DIABETES MEDICATION?  Trulicity (Saturday) Do not take on Saturday 01-10-2023, you may resume your injections the Saturday after your surgery (01-17-23)  Do not take oral diabetes medicines (pills) the morning of surgery. (Metformin)  THE NIGHT BEFORE SURGERY, take  15   units of  Glargine (lantus)  insulin.   THE MORNING OF SURGERY, take 0  units of   insulin.  You will not be able to wear your FREESTYLE LIBRE into surgery. Please call the following [employer] number [phone]) to request a free replacement for the wasted monitor.   Take ONLY these medicines the morning of surgery with A SIP OF WATER:  Buspirone (Buspar), Gabapentin, citalopram (Celexa), exemestane (Aromasin), metoprolol, amlodipine-atorvastatin (Caduet). You may use your Inhalers if needed the morning of surgery   If You have been diagnosed with Sleep Apnea - Bring CPAP mask and tubing day of surgery. We will provide you with a CPAP machine on the day of your surgery.                   You may not have any metal on your body including hair pins, jewelry, and body piercing  Do not wear make-up, lotions, powders, perfumes  or deodorant  Do not wear nail polish including gel and S&S, artificial / acrylic nails, or any other type of covering on natural nails including finger and toenails. If you have artificial nails, gel coating, etc., that needs to be removed by a nail salon, Please have this removed prior to surgery. Not doing so may mean that your surgery could be cancelled or delayed if the Surgeon or anesthesia staff feels like they are unable to monitor you safely.   Do not shave 48 hours  prior to surgery to avoid nicks in your skin which may contribute to postoperative infections.   Contacts, Hearing Aids, dentures or bridgework may not be worn into surgery. DENTURES WILL BE REMOVED PRIOR TO SURGERY PLEASE DO NOT APPLY "Poly grip" OR ADHESIVES!!!  Patients discharged on the day of surgery will not be allowed to drive home.  Someone NEEDS to stay with you for the first 24 hours after anesthesia.  Do not bring your home medications to the hospital. The Pharmacy will dispense medications listed on your medication list to you during your admission in the Hospital.   Please read over the following fact sheets you were given: IF YOU HAVE QUESTIONS ABOUT YOUR PRE-OP INSTRUCTIONS, PLEASE CALL [phone]  ([place])   [place] - Preparing for Surgery Before surgery, you can play an important role.  Because skin is not sterile, your skin needs to be as  free of germs as possible.  You can reduce the number of germs on your skin by washing with CHG (chlorahexidine gluconate) soap before surgery.  CHG is an antiseptic cleaner which kills germs and bonds with the skin to continue killing germs even after washing. Please DO NOT use if you have an allergy to CHG or antibacterial soaps.  If your skin becomes reddened/irritated stop using the CHG and inform your nurse when you arrive at Short Stay. Do not shave (including legs and underarms) for at least 48 hours prior to the first CHG shower.  You may shave your face/neck.  Please follow these instructions carefully:  1.  Shower with CHG Soap the night before surgery and the  morning of surgery.  2.  If you choose to wash your hair, wash your hair first as usual with your normal  shampoo.  3.  After you shampoo, rinse your hair and body thoroughly to remove the shampoo.                             4.  Use CHG as you would any other liquid soap.  You can apply chg directly to the skin and wash.  Gently with a scrungie or clean washcloth.  5.   Apply the CHG Soap to your body ONLY FROM THE NECK DOWN.   Do not use on face/ open                           Wound or open sores. Avoid contact with eyes, ears mouth and genitals (private parts).                       Wash face,  Genitals (private parts) with your normal soap.             6.  Wash thoroughly, paying special attention to the area where your  surgery  will be performed.  7.  Thoroughly rinse your body with warm water from the neck down.  8.  DO NOT shower/wash with your normal soap after using and rinsing off the CHG Soap.            9.  Pat yourself dry with a clean towel.            10.  Wear clean pajamas.            11.  Place clean sheets on your bed the night of your first shower and do not  sleep with pets.  ON THE DAY OF SURGERY : Do not apply any lotions/deodorants the morning of surgery.  Please wear clean clothes to the hospital/surgery center.    FAILURE TO FOLLOW THESE INSTRUCTIONS MAY RESULT IN THE CANCELLATION OF YOUR SURGERY  PATIENT SIGNATURE_________________________________  NURSE SIGNATURE__________________________________  ________________________________________________________________________

## 2023-01-06 ENCOUNTER — Other Ambulatory Visit (HOSPITAL_COMMUNITY): Payer: Self-pay

## 2023-01-07 ENCOUNTER — Encounter: Payer: Self-pay | Admitting: Dietician

## 2023-01-07 ENCOUNTER — Telehealth: Payer: Self-pay

## 2023-01-07 ENCOUNTER — Ambulatory Visit: Payer: Medicaid Other | Admitting: Licensed Clinical Social Worker

## 2023-01-07 NOTE — Telephone Encounter (Signed)
A Prior Authorization was initiated for this patients FREESTYLE LIBRE 3 SENSOR through CoverMyMeds.   Key: [id number]

## 2023-01-07 NOTE — Progress Notes (Signed)
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 _18  No _19  Yes   CPAP used?- _20  No _21  Yes    Does the patient monitor blood sugar? _22  No _23  Yes  _24  N/A Does patient have a [employer] or Dexacom? _25  No _26  Yes   Fasting Blood Sugar Ranges-  Checks Blood Sugar _____ times a day  Metformin 500 mg bid Insulin Glargine (Lantus): 30 units q hs.           Last dose of GLP1 agonist-  Trulicity (Saturday) Do not take on Saturday 01-10-2023, you may resume your injections the Saturday after your surgery (01-17-23)  Blood Thinner / Instructions: Plavix  hold 5 days, last dose on 01-07-23. Ok'd by Zettie Cooley, NP in 12-25-22 note.  Aspirin Instructions: holding per patient  ERAS Protocol Ordered: _27  No  _28  Yes PRE-SURGERY _29  ENSURE  _30  G2  _31  No Drink Ordered Patient is to be NPO after:   Comments:   Activity level: Patient can / can not climb a flight of stairs without difficulty; _32  No CP  _33  No SOB, but would have ______   Patient can / can not perform ADLs without  assistance.   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  Patient verbalized understanding and agreement to the Pre-Surgical Instructions that were given to them at this PAT appointment. Patient was also educated of the need to review these PAT instructions again prior to his/her surgery.I reviewed the appropriate phone numbers to call if they have any and questions or concerns.

## 2023-01-08 ENCOUNTER — Encounter (HOSPITAL_COMMUNITY)
Admission: RE | Admit: 2023-01-08 | Discharge: 2023-01-08 | Disposition: A | Discharge status: Home / Self Care | Payer: Medicaid Other | Source: Ambulatory Visit | Attending: Urology | Admitting: Urology

## 2023-01-08 ENCOUNTER — Encounter (HOSPITAL_COMMUNITY): Payer: Self-pay

## 2023-01-08 ENCOUNTER — Other Ambulatory Visit: Payer: Self-pay

## 2023-01-08 ENCOUNTER — Telehealth: Payer: Self-pay

## 2023-01-08 DIAGNOSIS — Z87891 Personal history of nicotine dependence: Secondary | ICD-10-CM | POA: Diagnosis not present

## 2023-01-08 DIAGNOSIS — I509 Heart failure, unspecified: Secondary | ICD-10-CM | POA: Diagnosis not present

## 2023-01-08 DIAGNOSIS — I2510 Atherosclerotic heart disease of native coronary artery without angina pectoris: Secondary | ICD-10-CM | POA: Diagnosis not present

## 2023-01-08 DIAGNOSIS — N200 Calculus of kidney: Secondary | ICD-10-CM | POA: Insufficient documentation

## 2023-01-08 DIAGNOSIS — E119 Type 2 diabetes mellitus without complications: Secondary | ICD-10-CM | POA: Diagnosis not present

## 2023-01-08 DIAGNOSIS — Z01818 Encounter for other preprocedural examination: Secondary | ICD-10-CM | POA: Diagnosis present

## 2023-01-08 DIAGNOSIS — J449 Chronic obstructive pulmonary disease, unspecified: Secondary | ICD-10-CM | POA: Insufficient documentation

## 2023-01-08 DIAGNOSIS — G4730 Sleep apnea, unspecified: Secondary | ICD-10-CM | POA: Diagnosis not present

## 2023-01-08 DIAGNOSIS — I110 Hypertensive heart disease with heart failure: Secondary | ICD-10-CM | POA: Diagnosis not present

## 2023-01-08 DIAGNOSIS — Z01812 Encounter for preprocedural laboratory examination: Secondary | ICD-10-CM | POA: Insufficient documentation

## 2023-01-08 LAB — BASIC METABOLIC PANEL
Anion gap: 9 (ref 5–15)
BUN: 20 mg/dL (ref 8–23)
CO2: 29 mmol/L (ref 22–32)
Calcium: 9.2 mg/dL (ref 8.9–10.3)
Chloride: 98 mmol/L (ref 98–111)
Creatinine, Ser: 0.89 mg/dL (ref 0.44–1.00)
GFR, Estimated: >60 mL/min (ref >60)
Glucose, Bld: 119 mg/dL — ABNORMAL HIGH (ref 70–99)
Potassium: 3.9 mmol/L (ref 3.5–5.1)
Sodium: 136 mmol/L (ref 135–145)

## 2023-01-08 LAB — CBC
HCT: 39.5 % (ref 36.0–46.0)
Hemoglobin: 12.6 g/dL (ref 12.0–15.0)
MCH: 27.6 pg (ref 26.0–34.0)
MCHC: 31.9 g/dL (ref 30.0–36.0)
MCV: 86.6 fL (ref 80.0–100.0)
Platelets: 208 10*3/uL (ref 150–400)
RBC: 4.56 MIL/uL (ref 3.87–5.11)
RDW: 14.3 % (ref 11.5–15.5)
WBC: 8.5 10*3/uL (ref 4.0–10.5)
nRBC: 0 % (ref 0.0–0.2)

## 2023-01-08 LAB — GLUCOSE, CAPILLARY: Glucose-Capillary: 120 mg/dL — ABNORMAL HIGH (ref 70–99)

## 2023-01-08 NOTE — Progress Notes (Addendum)
Anesthesia Chart Review   Case: [id number] Date/Time: 01/13/23 1245   Procedures:      CYSTOSCOPY WITH RETROGRADE PYELOGRAM, URETEROSCOPY AND STENT PLACEMENT (Right)     HOLMIUM LASER APPLICATION (Right)   Anesthesia type: General   Pre-op diagnosis: Right Nephrolithiasis   Location: [facility] / [facility]   Surgeons: Primus Bravo., MD       DISCUSSION:62 y.o. former smoker with h/o HTN, sleep apnea, COPD, pt uses O2 at bedtime and PRN, TAVR, CAD, CHF, PE, right nephrolithiasis scheduled for above procedure 01/13/2023 with Dr. Michaelle Birks.   Per cardiology preoperative evaluation 01/07/2023, "Preoperative cardiac evaluation: She is having symptoms of DOE.  This has been persistent for many years but she questions if it is anginal equivalent. Unable to achieve > 4 METS activity. According to the Revised Cardiac Risk Index (RCRI), her Perioperative Risk of Major Cardiac Event is (%): 11. Her Functional Capacity in METs is: 3.41 according to the Duke Activity Status Index (DASI). We will get Lexiscan Myoview to evaluate for ischemia. Will await test results for determination to hold Plavix.    Addendum: Low risk nuclear stress test 01/01/2023.  She may proceed with surgery without further testing and may hold Plavix for 5 days prior to procedure. Plavix should be resumed as soon as hemodynamically stable following the procedure."  Pt advised to hold Plavix 5 days prior to procedure.   Anticipate pt can proceed with planned procedure barring acute status change.   VS: LMP 02/03/2012   PROVIDERS: [name], MD is PCP   Cardiologist - Darlina Guys, MD  LABS: Labs reviewed: Acceptable for surgery. (all labs ordered are listed, but only abnormal results are displayed)  Labs Reviewed - No data to display   IMAGES:   EKG:   CV: Myocardial Perfusion 01/01/2023   Normal blood pressure and normal heart rate response noted during stress. Heart rate recovery was  normal.   No ST deviation was noted. The ECG was not diagnostic due to pharmacologic protocol.   Breast attenuation artifact was present. Image quality affected due to significant extracardiac activity.   LV perfusion is normal. There is no evidence of ischemia. There is no evidence of infarction.   Left ventricular function is normal. Nuclear stress EF: 60 %. The left ventricular ejection fraction is normal (55-65%). End diastolic cavity size is normal. End systolic cavity size is normal.   The study is normal. The study is low risk.  Echo 01/23/2021 1. Left ventricular ejection fraction, by estimation, is 60 to 65%. The  left ventricle has normal function. The left ventricle has no regional  wall motion abnormalities. There is mild concentric left ventricular  hypertrophy. Left ventricular diastolic  parameters are indeterminate.   2. Right ventricular systolic function is normal. The right ventricular  size is normal.   3. The mitral valve is abnormal. No evidence of mitral valve  regurgitation. Mild mitral stenosis. The mean mitral valve gradient is 4.0  mmHg with average heart rate of 77 bpm.   4. The aortic valve has been repaired/replaced. Aortic valve  regurgitation is not visualized. 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 Procedure Date: 12/12/2014. Aortic  valve mean gradient measures 15.0 mmHg.   5. The inferior vena cava is normal in size with greater than 50%  respiratory variability, suggesting right atrial pressure of 3 mmHg.  Past Medical History:  Diagnosis Date   Anemia  Anxiety    Aortic valve stenosis, severe    Arthritis    PAIN AND SEVERE OA LEFT KNEE ; S/P RIGHT TKA ON 02/03/12; HAS LOWER BACK PAIN-UNABLE TO STAND MORE THAN 10 MIN; ARTHRITIS "ALL OVER"   Asthma    Blood transfusion    2013[facility]   Breast cancer in female [facility])    Right   CAD (coronary artery disease)    Cath 2010 with DES x 1 RCA-- PT'S CARDIOLOGIST  IS DR. MCALHANY   Chronic diastolic congestive heart failure (HCC)    COPD (chronic obstructive pulmonary disease) (HCC)    Depression    Diabetes mellitus DIAGNOSED IN2010   Dyspnea    with much ambulation   Eczema    on back   Headache    migraines younger- rare now 02/07/21   Heart murmur    no current problems   History of hiatal hernia    History of kidney stones    passed or blasted   Hyperlipidemia    Hypertension    Morbid obesity with body mass index of 60.0-69.9 in adult [facility])    Myocardial infarction ([place])    PT THINKS SHE WAS DX WITH MI AT THE TIME OF HEART STENTING   Neuromuscular disorder (HCC)    bilateral arm/hands   Oxygen dependent    uses 3L oxygen night/prn   Personal history of radiation therapy    Pneumonia    Pulmonary embolism ([place]) 02/08/2012   S/P RT TOTAL KNEE ON 02/03/12--ON 02/08/12--DEVELOPED ACUTE SOB AND CHEST PAIN--AND DIAGNOSED WITH  PULMONARY EMBOLUS AND PNEUMONIA   Restless leg syndrome    Sleep apnea    uses 3 liters O2 at night as needed   Uterine fibroid    NO PROBLEMS AT PRESENT FROM THE FIBROIDS-STATES SHE IS POST MENOPAUSAL-LAST MENSES 2010 EXCEPT FOR EPISODE THIS YR OF BLEEDING RELATED TO FIBROIDS.   Weakness    BOTH HANDS - S/P BILATERAL CARPAL TUNNEL RELEASE--BUT STILL HAS WEAKNESS--OFTEN DROPS THINGS    Past Surgical History:  Procedure Laterality Date   BACK SURGERY  02/11/2021   Dr Louanne Skye   BREAST BIOPSY Right 06/04/2018   BREAST LUMPECTOMY Right 06/2018   BREAST LUMPECTOMY WITH RADIOACTIVE SEED AND SENTINEL LYMPH NODE BIOPSY Right 07/19/2018   Procedure: BREAST LUMPECTOMY WITH RADIOACTIVE SEED AND SENTINEL LYMPH NODE BIOPSY;  Surgeon: Alphonsa Overall, MD;  Location: [place];  Service: General;  Laterality: Right;   CARDIAC CATHETERIZATION     CARDIAC VALVE REPLACEMENT     2017   CARPAL TUNNEL RELEASE     Bilateral   CHOLECYSTECTOMY     COLONOSCOPY     CORONARY ANGIOPLASTY     2010 has stent in place   CYSTOSCOPY W/  RETROGRADES Right 09/21/2013   Procedure: CYSTOSCOPY WITH RIGHT RETROGRADE PYELOGRAM RIGHT DOUBLE J STENT ;  Surgeon: Fredricka Bonine, MD;  [facility];  Service: Urology;  Laterality: Right;   CYSTOSCOPY W/ URETERAL STENT PLACEMENT Right 11/16/2022   Procedure: CYSTOSCOPY WITH RETROGRADE PYELOGRAM/URETERAL STENT PLACEMENT;  Surgeon: Vira Agar, MD;  Location: [place];  Service: Urology;  Laterality: Right;   CYSTOSCOPY WITH URETEROSCOPY AND STENT PLACEMENT Right 10/25/2013   Procedure: CYSTOSCOPY RIGHT URETEROSCOPY HOLMIUM LASER LITHO AND STENT PLACEMENT;  Surgeon: Fredricka Bonine, MD;  [facility];  Service: Urology;  Laterality: Right;   HERNIA REPAIR     INTRAOPERATIVE TRANSESOPHAGEAL ECHOCARDIOGRAM N/A 12/12/2014   Procedure: INTRAOPERATIVE TRANSESOPHAGEAL ECHOCARDIOGRAM;  Surgeon: Burnell Blanks,  MD;  Location: [place];  Service: Cardiovascular;  Laterality: N/A;   JOINT REPLACEMENT     bil total knees   KNEE ARTHROPLASTY  02/03/2012   Procedure: COMPUTER ASSISTED TOTAL KNEE ARTHROPLASTY;  Surgeon: Mcarthur Rossetti, MD;  Location: [place];  Service: Orthopedics;  Laterality: Right;  Right total knee arthroplasty   LEFT AND RIGHT HEART CATHETERIZATION WITH CORONARY ANGIOGRAM N/A 03/17/2013   Procedure: LEFT AND RIGHT HEART CATHETERIZATION WITH CORONARY ANGIOGRAM;  Surgeon: Burnell Blanks, MD;  [facility] C[facility] LAB;  Service: Cardiovascular;  Laterality: N/A;   LEFT AND RIGHT HEART CATHETERIZATION WITH CORONARY/GRAFT ANGIOGRAM N/A 09/14/2014   Procedure: LEFT AND RIGHT HEART CATHETERIZATION WITH Beatrix Fetters;  Surgeon: Burnell Blanks, MD;  Location: [facility] CATH LAB;  Service: Cardiovascular;  Laterality: N/A;   REVERSE SHOULDER ARTHROPLASTY Left 03/06/2022   Procedure: LEFT SHOULDER REPLACEMENT APPLICATION OF WOUND VAC;  Surgeon: [name], MD;  Location: [place];  Service: Orthopedics;  Laterality: Left;   TEE WITHOUT  CARDIOVERSION N/A 03/14/2013   Procedure: TRANSESOPHAGEAL ECHOCARDIOGRAM (TEE);  Surgeon: Lelon Perla, MD;  Location: [facility] ENDOSCOPY;  Service: Cardiovascular;  Laterality: N/A;   TEE WITHOUT CARDIOVERSION N/A 11/14/2014   Procedure: TRANSESOPHAGEAL ECHOCARDIOGRAM (TEE);  Surgeon: Lelon Perla, MD;  Location: [facility] ENDOSCOPY;  Service: Cardiovascular;  Laterality: N/A;   TONSILLECTOMY     maybe as a child- does not know   TOTAL KNEE ARTHROPLASTY  09/10/2012   Procedure: TOTAL KNEE ARTHROPLASTY;  Surgeon: Mcarthur Rossetti, MD;  [facility];  Service: Orthopedics;  Laterality: Left;   TOTAL KNEE REVISION Right 07/15/2013   Procedure: REVISION ARTHROPLASTY RIGHT KNEE;  Surgeon: Mcarthur Rossetti, MD;  [facility];  Service: Orthopedics;  Laterality: Right;   TRANSCATHETER AORTIC VALVE REPLACEMENT, TRANSFEMORAL N/A 12/12/2014   Procedure: TRANSCATHETER AORTIC VALVE REPLACEMENT, TRANSFEMORAL;  Surgeon: Burnell Blanks, MD;  Location: [place];  Service: Cardiovascular;  Laterality: N/A;   TRIGGER FINGER RELEASE  09/10/2012   Procedure: RELEASE TRIGGER FINGER/A-1 PULLEY;  Surgeon: Mcarthur Rossetti, MD;  [facility];  Service: Orthopedics;  Laterality: Right;  Right Ring Finger   TUBAL LIGATION      MEDICATIONS:  Accu-Chek FastClix Lancets MISC   acetaminophen (TYLENOL) 500 MG tablet   albuterol (PROVENTIL) (2.5 MG/3ML) 0.083% nebulizer solution   amlodipine-atorvastatin (CADUET) 5-80 MG tablet   ASPIRIN ADULT LOW STRENGTH 81 MG EC tablet   B-D UF III MINI PEN NEEDLES 31G X 5 MM MISC   Blood Glucose Monitoring Suppl (ACCU-CHEK GUIDE) w/Device KIT   busPIRone (BUSPAR) 10 MG tablet   cholecalciferol (VITAMIN D3) 25 MCG (1000 UNIT) tablet   citalopram (CELEXA) 20 MG tablet   clopidogrel (PLAVIX) 75 MG tablet   Continuous Blood Gluc Sensor (FREESTYLE LIBRE 3 SENSOR) MISC   Dulaglutide (TRULICITY) 4.5 NT/6.1WE SOPN   Evolocumab (REPATHA SURECLICK) 315 MG/ML  SOAJ   exemestane (AROMASIN) 25 MG tablet   ezetimibe (ZETIA) 10 MG tablet   ferrous sulfate 325 (65 FE) MG tablet   Fluticasone-Umeclidin-Vilant (TRELEGY ELLIPTA) 100-62.5-25 MCG/ACT AEPB   furosemide (LASIX) 40 MG tablet   gabapentin (NEURONTIN) 300 MG capsule   glucose blood (ACCU-CHEK GUIDE) test strip   hydrocortisone cream 1 %   LANTUS SOLOSTAR 100 UNIT/ML Solostar Pen   losartan-hydrochlorothiazide (HYZAAR) 100-25 MG tablet   metFORMIN (GLUCOPHAGE-XR) 500 MG 24 hr tablet   metoprolol succinate (TOPROL-XL) 50 MG 24 hr tablet   montelukast (SINGULAIR) 10 MG tablet  nitrofurantoin, macrocrystal-monohydrate, (MACROBID) 100 MG capsule   senna-docusate (SENNA-TIME S) 8.6-50 MG tablet   traZODone (DESYREL) 50 MG tablet   VENTOLIN HFA 108 (90 Base) MCG/ACT inhaler   No current facility-administered medications for this encounter.     Konrad Felix Ward, PA-C [facility] [phone]

## 2023-01-08 NOTE — Anesthesia Preprocedure Evaluation (Addendum)
Anesthesia Evaluation  Patient identified by MRN, date of birth, ID band Patient awake    Reviewed: Allergy & Precautions, NPO status , Patient's Chart, lab work & pertinent test results  Airway Mallampati: III  TM Distance: >3 FB Neck ROM: Full    Dental  (+) Edentulous Upper, Edentulous Lower, Dental Advisory Given   Pulmonary asthma , sleep apnea and Oxygen sleep apnea , COPD (3L O2 at night),  oxygen dependent, former smoker, PE   Pulmonary exam normal breath sounds clear to auscultation       Cardiovascular hypertension, + CAD, + Past MI, + Cardiac Stents and +CHF  Normal cardiovascular exam+ Valvular Problems/Murmurs (s/p TAVR 2015) AS  Rhythm:Regular Rate:Normal  Stress Test 2024   Normal blood pressure and normal heart rate response noted during stress. Heart rate recovery was normal.   No ST deviation was noted. The ECG was not diagnostic due to pharmacologic protocol.   Breast attenuation artifact was present. Image quality affected due to significant extracardiac activity.   LV perfusion is normal. There is no evidence of ischemia. There is no evidence of infarction.   Left ventricular function is normal. Nuclear stress EF: 60 %. The left ventricular ejection fraction is normal (55-65%). End diastolic cavity size is normal. End systolic cavity size is normal.   The study is normal. The study is low risk.  TTE 2022  1. Left ventricular ejection fraction, by estimation, is 60 to 65%. The  left ventricle has normal function. The left ventricle has no regional  wall motion abnormalities. There is mild concentric left ventricular  hypertrophy. Left ventricular diastolic  parameters are indeterminate.   2. Right ventricular systolic function is normal. The right ventricular  size is normal.   3. The mitral valve is abnormal. No evidence of mitral valve  regurgitation. Mild mitral stenosis. The mean mitral valve gradient  is 4.0  mmHg with average heart rate of 77 bpm.   4. The aortic valve has been repaired/replaced. Aortic valve  regurgitation is not visualized. 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 Procedure Date: 12/12/2014. Aortic  valve mean gradient measures 15.0 mmHg.   5. The inferior vena cava is normal in size with greater than 50%  respiratory variability, suggesting right atrial pressure of 3 mmHg.     Neuro/Psych  Headaches PSYCHIATRIC DISORDERS Anxiety Depression       GI/Hepatic Neg liver ROS, hiatal hernia,GERD  ,,  Endo/Other  diabetes  Morbid obesity (BMI 54)  Renal/GU Renal InsufficiencyRenal disease  negative genitourinary   Musculoskeletal  (+) Arthritis ,    Abdominal   Peds  Hematology negative hematology ROS (+)   Anesthesia Other Findings   Reproductive/Obstetrics                             Anesthesia Physical Anesthesia Plan  ASA: 3  Anesthesia Plan: General   Post-op Pain Management: Tylenol PO (pre-op)*   Induction: Intravenous  PONV Risk Score and Plan: 3 and Midazolam, Dexamethasone and Ondansetron  Airway Management Planned: Oral ETT and Video Laryngoscope Planned  Additional Equipment:   Intra-op Plan:   Post-operative Plan: Extubation in OR  Informed Consent: I have reviewed the patients History and Physical, chart, labs and discussed the procedure including the risks, benefits and alternatives for the proposed anesthesia with the patient or authorized representative who has indicated his/her understanding and acceptance.  Dental advisory given  Plan Discussed with: CRNA  Anesthesia Plan Comments: (See PAT note 01/08/2023)       Anesthesia Quick Evaluation

## 2023-01-08 NOTE — Telephone Encounter (Signed)
-----   Message from Michae Kava, [place] sent at 01/08/2023  8:56 AM EST ----- Regarding: RE: f/u clearance Good morning Estill Bamberg,  See notes below from Christen Bame, NP who saw the pt and cleared her. If you tell me what fax # I will be happy to fax the ov note to you.  See addendum below.   Thank you Arbie Cookey pre op team  Preoperative cardiac evaluation: She is having symptoms of DOE.  This has been persistent for many years but she questions if it is anginal equivalent. Unable to achieve > 4 METS activity. According to the Revised Cardiac Risk Index (RCRI), her Perioperative Risk of Major Cardiac Event is (%): 11. Her Functional Capacity in METs is: 3.41 according to the Duke Activity Status Index (DASI). We will get Lexiscan Myoview to evaluate for ischemia. Will await test results for determination to hold Plavix.   Addendum: Low risk nuclear stress test 01/01/2023.  She may proceed with surgery without further testing and may hold Plavix for 5 days prior to procedure. Plavix should be resumed as soon as hemodynamically stable following the procedure.    ----- Message ----- From: Dorisann Frames, RN Sent: 01/07/2023   4:02 PM EST To: Michae Kava, CMA Subject: f/u clearance                                  Hi Arbie Cookey,  I wanted to see if you could help direct me with this follow up for surgery. I had asked for Plavix hold prior to 1/16 surgery. I am seeing if you can find if this has been approved. It was pending a lexascan.  Thanks, Estill Bamberg

## 2023-01-08 NOTE — Telephone Encounter (Signed)
Patient called and notified of plavix hold 5 days prior to surgery. Patient voiced understanding.,  Last dose 01/07/2023

## 2023-01-08 NOTE — Telephone Encounter (Signed)
Prior Auth for patients medication FREESTYLE LIBRE 3 SENSOR approved by [facility] MEDICAID from 01/07/22 to 01/07/24.  Key: [id number]

## 2023-01-09 ENCOUNTER — Other Ambulatory Visit: Payer: Medicaid Other | Admitting: Licensed Clinical Social Worker

## 2023-01-09 ENCOUNTER — Other Ambulatory Visit: Payer: Medicaid Other | Admitting: Obstetrics and Gynecology

## 2023-01-09 LAB — HEMOGLOBIN A1C
Hgb A1c MFr Bld: 6.6 % — ABNORMAL HIGH (ref 4.8–5.6)
Mean Plasma Glucose: 143 mg/dL

## 2023-01-09 MED ORDER — ZOLPIDEM TARTRATE 5 MG PO TABS: 5.0000 mg | ORAL_TABLET | Freq: Every evening | ORAL | 1 refills | Status: DC | PRN

## 2023-01-09 NOTE — Patient Instructions (Signed)
Visit Information  [name] was given information about Medicaid Managed Care team care coordination services as a part of their [facility] Medicaid benefit. [name] verbally consented to engagement with the [facility] Managed Care team.   If you are experiencing a medical emergency, please call 911 or report to your local emergency department or urgent care.   If you have a non-emergency medical problem during routine business hours, please contact your provider's office and ask to speak with a nurse.   For questions related to your [facility] health plan, please call: [phone] or go here:[email or web address]  If you would like to schedule transportation through your [facility] plan, please call the following number at least 2 days in advance of your appointment: [phone].  You can also use the MTM portal or MTM mobile app to manage your rides. For the portal, please go to [email or web address].  Call the [place] at [phone], at any time, 24 hours a day, 7 days a week. If you are in danger or need immediate medical attention call 911.  If you would like help to quit smoking, call 1-800-QUIT-NOW 937-062-3878) OR Espaol: 1-855-Djelo-Ya (7-939-030-0923) o para ms informacin haga clic aqu or Text READY to 200-400 to register via text   Following is a copy of your plan of care:  Care Plan : LCSW Plan of Care  Updates made by Greg Cutter, LCSW since 01/09/2023 12:00 AM     Problem: Depression Identification (Depression)      Long-Range Goal: Depressive Symptoms Identified   Start Date: 04/17/2022  Recent Progress: On track  Note:   Long-Range Goal: I want to start mental health treatment    Start Date: 04/17/2022  Priority: High  Note:   Priority: High   Timeframe:  Long-Range Goal Priority:  High Start Date:   04/17/22                Expected End Date:  ongoing                 Follow Up Date--01/19/23 at 11  am  - check out counseling - keep 90 percent of counseling appointments - schedule counseling appointment    Why is this important?             Beating depression may take some time.            If you don't feel better right away, don't give up on your treatment plan.     Current barriers:   Chronic Mental Health needs related to depression, stress and anxiety. Patient requires Support, Education, Resources, Referrals, Advocacy, and Care Coordination, in order to meet Unmet Mental Health Needs. Patient will implement clinical interventions discussed today to decrease symptoms of depression and increase knowledge and/or ability of: coping skills. Mental Health Concerns and Social Isolation Patient lacks knowledge of available community counseling agencies and resources.   Clinical Goal(s): verbalize understanding of plan for management of Anxiety, Depression, and Stress and demonstrate a reduction in symptoms. Patient will connect with a provider for ongoing mental health treatment, increase coping skills, healthy habits, self-management skills, and stress reduction        Patient Goals/Self-Care Activities: Over the next 120 days Attend scheduled medical appointments Utilize healthy coping skills and supportive resources discussed Contact PCP with any questions or concerns Keep 90 percent of counseling appointments Call your insurance provider for more information about your Enhanced Benefits  Check out counseling resources provided  Begin personal  counseling with LCSW, to reduce and manage symptoms of Depression and Stress, until well-established with mental health provider Accept all calls from representative with [facility] in an effort to establish ongoing mental health counseling and supportive services. Incorporate into daily practice - relaxation techniques, deep breathing exercises, and mindfulness meditation strategies. Talk about feelings with friends, family members, spiritual  advisor, etc. Contact LCSW directly [phone]), if you have questions, need assistance, or if additional social work needs are identified between now and our next scheduled telephone outreach call. Call [phone] for mental health hotline/crisis line if needed (24/7 available) Try techniques to reduce symptoms of anxiety/negative thinking (deep breathing, distraction, positive self talk, etc)  - develop a personal safety plan - develop a plan to deal with triggers like holidays, anniversaries - exercise at least 2 to 3 times per week - have a plan for how to handle bad days - journal feelings and what helps to feel better or worse - spend time or talk with others at least 2 to 3 times per week - watch for early signs of feeling worse - begin personal counseling - call and visit an old friend - check out volunteer opportunities - join a support group - laugh; watch a funny movie or comedian - learn and use visualization or guided imagery - perform a random act of kindness - practice relaxation or meditation daily - start or continue a personal journal - practice positive thinking and self-talk -continue with compliance of taking medication        05/21/2022   12:53 PM 04/17/2022    3:17 PM 11/14/2021    4:02 PM 08/21/2021    4:33 PM 12/10/2020    4:00 PM  Depression screen PHQ 2/9  Decreased Interest '2 2 1 '$ 0 1  Down, Depressed, Hopeless '2 2 1 '$ 0 0  PHQ - 2 Score '4 4 2 '$ 0 1  Altered sleeping '3 3 3   1  '$ Tired, decreased energy '3 3 1   1  '$ Change in appetite '3 3 1   1  '$ Feeling bad or failure about yourself  0 1 0   0  Trouble concentrating 0 0 0   0  Moving slowly or fidgety/restless 0   0   0  Suicidal thoughts 0 0 0   0  PHQ-9 Score '13 14 7   4  '$ Difficult doing work/chores Somewhat difficult Somewhat difficult Not difficult at all   Not difficult at all        24- Hour Availability:    [facility]  [address] [place], [place]  [place] [facility] [phone]   [facility] [employer] [phone]   [place]  [phone]    [place]  [phone] (after hours)   Therapeutic Alternative/Mobile Crisis   [phone]   [place] National Suicide Hotline  218-448-5405 (TALK) OR 988   Call 911 or go to emergency room   [facility]  [phone]);  [place] and [employer]  [phone]); [place], [place], [place], [place], [place], [place], [place]

## 2023-01-09 NOTE — Patient Instructions (Signed)
Hi [name], thanks for speaking with me this afternoon-have a great weekend!  [name] was given information about Medicaid Managed Care team care coordination services as a part of their [facility] Medicaid benefit. [name] verbally consented to engagement with the [facility] Managed Care team.   If you are experiencing a medical emergency, please call 911 or report to your local emergency department or urgent care.   If you have a non-emergency medical problem during routine business hours, please contact your provider's office and ask to speak with a nurse.   For questions related to your [facility] health plan, please call: [phone] or go here:[email or web address]  If you would like to schedule transportation through your [facility] plan, please call the following number at least 2 days in advance of your appointment: [phone].  You can also use the MTM portal or MTM mobile app to manage your rides. For the portal, please go to [email or web address].  Call the [facility] at [phone], at any time, 24 hours a day, 7 days a week. If you are in danger or need immediate medical attention call 911.  If you would like help to quit smoking, call 1-800-QUIT-NOW (639) 774-6373) OR Espaol: 1-855-Djelo-Ya (2-694-854-6270) o para ms informacin haga clic aqu or Text READY to 200-400 to register via text  [name] - following are the goals we discussed in your visit today:   Goals Addressed    Timeframe:  Long-Range Goal Priority:  High Start Date:      04/14/22                       Expected End Date:     ongoing                  Follow Up Date 02/06/23   - schedule appointment for flu shot - schedule appointment for vaccines needed due to my age or health - schedule recommended health tests (blood work, mammogram, colonoscopy, pap test) - schedule and keep appointment for annual check-up    Why is this important?   Screening  tests can find diseases early when they are easier to treat.  Your doctor or nurse will talk with you about which tests are important for you.  Getting shots for common diseases like the flu and shingles will help prevent them.  01/09/23:  Seen and evaluated by [facility] 12/25/22.  To have urinary stent placement 01/13/23 and ECHO 01/26/23  Patient verbalizes understanding of instructions and care plan provided today and agrees to view in [place]. Active MyChart status and patient understanding of how to access instructions and care plan via MyChart confirmed with patient.     The Managed Medicaid care management team will reach out to the patient again over the next 30 business  days.  The  Patient has been provided with contact information for the Managed Medicaid care management team and has been advised to call with any health related questions or concerns.   Aida Raider RN, BSN [place]  [facility] Curator - Managed Medicaid High Risk [phone].   Following is a copy of your plan of care:  Care Plan : [place] of Care  Updates made by Gayla Medicus, RN since 01/09/2023 12:00 AM     Problem: Health Promotion or Disease Self-Management (General Plan of Care)   Priority: High  Onset Date: 04/14/2022     Long-Range Goal: Chronic Disease Management  and Care Coordination Needs   Start Date: 04/14/2022  Expected End Date: 04/10/2023  Recent Progress: Not on track  Priority: High  Note:   Current Barriers:  Knowledge Deficits related to plan of care for management of Anxiety and Depression along with other chronic health conditions, HTN, DM, HF, asthma, COPD, OSA, arthritis, GERD, CAD, HLD, h/o breast cancer, RLS Chronic Disease Management support and education needs related to Anxiety and Depression. DM, HTN, HF, asthma, OSA, arthritis, GERD, CAD, HLD, h/o breast cancer, RLS. 01/09/23:  patient with issues trying to sleep-provider prescribed  Ambien today.  To have urinary stent placement 01/13/23 and ECHO 01/26/23.  Not checking BP-124/60 at [facility] appt 12/28.  Awaiting new Freestyle Libre-blood sugar 120 at pre op appt yesterday.   RNCM Clinical Goal(s):  Patient will verbalize understanding of plan for management of Anxiety and Depression, DM, HTN  as evidenced by patient report take all medications exactly as prescribed and will call provider for medication related questions as evidenced by patient report demonstrate understanding of rationale for each prescribed medication as evidenced by patient report attend all scheduled medical appointments  as evidenced by patient report continue to work with RN Care Manager to address care management and care coordination needs related to  Anxiety and Depression, DM, HTN as evidenced by adherence to CM Team Scheduled appointments work with Education officer, museum to address  related to the management of Mental Health Concerns  related to the management of Anxiety and Depression as evidenced by review of EMR and patient or Education officer, museum report through collaboration with Consulting civil engineer, provider, and care team.   Interventions: Inter-disciplinary care team collaboration (see longitudinal plan of care) Evaluation of current treatment plan related to  self management and patient's adherence to plan as established by provider   (Status:  New goal.)  Long Term Goal Evaluation of current treatment plan related to Anxiety and Depression, Mental Health Concerns  self-management and patient's adherence to plan as established by provider. Discussed plans with patient for ongoing care management follow up and provided patient with direct contact information for care management team Advised patient to contact provider for any additional DME needs, CBG, and possible general surgeon referral. Reviewed medications with patient Collaborated with LCSW regarding anxiety/depression Reviewed scheduled/upcoming provider  appointments Advised patient, providing education and rationale, to check cbg and record, calling provider  for findings outside established parameters  Social Work referral for anxiety/depression Discussed plans with patient for ongoing care management follow up and provided patient with direct contact information for care management team Assessed social determinant of health barriers Collaborated with [place] Medicaid Liaison regarding DME-motorized wheelchair and lift chair.  Diabetes Interventions:  (Status:  New goal.) Long Term Goal Assessed patient's understanding of A1c goal: <7% Reviewed medications with patient and discussed importance of medication adherence Reviewed scheduled/upcoming provider appointments  Advised patient, providing education and rationale, to check cbg and record, calling provider for findings outside established parameters Review of patient status, including review of consultants reports, relevant laboratory and other test results, and medications completed Assessed social determinant of health barriers Lab Results  Component Value Date   HGBA1C 7.1 (A) 08/27/2022  HGB 6.6 on 01/08/23  Hypertension Interventions:  (Status:  New goal.) Long Term Goal Last practice recorded BP readings:  BP Readings from Last 3 Encounters:  09/12/22 133/69  08/27/22 (!) 109/58  07/01/22 137/75  12/25/22          124/60  Most recent eGFR/CrCl:  Lab Results  Component Value Date   EGFR 90 06/04/2022    No components found for: "CRCL"  Evaluation of current treatment plan related to hypertension self management and patient's adherence to plan as established by provider Reviewed medications with patient and discussed importance of compliance Discussed plans with patient for ongoing care management follow up and provided patient with direct contact information for care management team Advised patient, providing education and rationale, to monitor blood  pressure daily and record, calling PCP for findings outside established parameters Reviewed scheduled/upcoming provider appointments including:  Assessed social determinant of health barriers   Patient Goals/Self-Care Activities: Take all medications as prescribed Attend all scheduled provider appointments Call pharmacy for medication refills 3-7 days in advance of running out of medications Perform all self care activities independently  Perform IADL's (shopping, preparing meals, housekeeping, managing finances) independently Call provider office for new concerns or questions  Work with the social worker to address care coordination needs and will continue to work with the clinical team to address health care and disease management related needs  Follow Up Plan:  The patient has been provided with contact information for the care management team and has been advised to call with any health related questions or concerns.  The care management team will reach out to the patient again over the next 30 business  days.

## 2023-01-09 NOTE — Patient Outreach (Signed)
Medicaid Managed Care Social Work Note  01/09/2023 Name:  [name] MRN:  [id number] DOB:  [birth year]  [name] is an 62 y.o. year old female who is a primary patient of [name], MD.  The [place] team was consulted for assistance with:  [place] and Resources  Ms. Hopson was given information about Medicaid Managed Care Coordination team services today. [name] Patient agreed to services and verbal consent obtained.  Engaged with patient  for by telephone forfollow up visit in response to referral for case management and/or care coordination services.   Assessments/Interventions:  Review of past medical history, allergies, medications, health status, including review of consultants reports, laboratory and other test data, was performed as part of comprehensive evaluation and provision of chronic care management services.  SDOH: (Social Determinant of Health) assessments and interventions performed: SDOH Interventions    Flowsheet Row Patient Outreach Telephone from 01/09/2023 in [facility] Most recent reading at 01/09/2023 10:40 AM Patient Outreach Telephone from 12/08/2022 in [place] Most recent reading at 12/08/2022 10:22 AM ED to Hosp-Admission (Discharged) from 11/16/2022 in [facility] Most recent reading at 11/16/2022  5:56 PM Patient Outreach Telephone from 11/07/2022 in [place] Most recent reading at 11/07/2022  2:14 PM Patient Outreach Telephone from 11/07/2022 in [place] Most recent reading at 11/07/2022 11:06 AM Patient Outreach Telephone from 11/05/2022 in [place] Most recent reading at 11/05/2022 12:56 PM  SDOH Interventions        Food Insecurity Interventions -- -- -- -- -- Intervention Not Indicated  Housing  Interventions -- -- Intervention Not Indicated -- -- Intervention Not Indicated  Alcohol Usage Interventions -- Intervention Not Indicated (Score <7) -- -- -- --  Financial Strain Interventions -- Intervention Not Indicated -- -- -- --  Physical Activity Interventions -- -- -- Other (Comments)  [patient not physically able to engage in moderate to strenuous exercise] -- --  Stress Interventions Offered [employer], Provide Counseling -- -- -- Provide Counseling, Offered [employer] --  Social Connections Interventions -- -- -- Intervention Not Indicated -- --       Advanced Directives Status:  See Care Plan for related entries.  Care Plan                 No Known Allergies  Medications Reviewed Today     Reviewed by Mena Goes, RN (Registered Nurse) on 01/08/23 at 1413  Med List Status: <None>   Medication Order Taking? Sig Documenting Provider Last Dose Status Informant  Accu-Chek FastClix Lancets MISC [id number]  Check blood sugar two times a day Lorella Nimrod, MD  Active Self  acetaminophen (TYLENOL) 500 MG tablet [id number]  Take 1,000 mg by mouth every 6 (six) hours as needed for moderate pain. [provider]  Active Self  albuterol (PROVENTIL) (2.5 MG/3ML) 0.083% nebulizer solution [id number]  USE 1 VIAL IN NEBULIZER EVERY 4 HOURS AS NEEDED FOR WHEEZING OR SHORTNESS OF BREATH Amponsah, Charisse March, MD  Active Self  amlodipine-atorvastatin (CADUET) 5-80 MG tablet [id number]  Take 1 tablet by mouth daily. Leigh Aurora, DO  Active Self  ASPIRIN ADULT LOW STRENGTH 81 MG EC tablet [id number]  TAKE 1 Tablet BY MOUTH ONCE DAILY  Patient not taking: Reported on 01/07/2023   Lorella Nimrod, MD  Active Self  B-D UF III MINI PEN NEEDLES  31G X 5 MM MISC [id number]  USE AS DIRECTED DAILY Angelica Pou, MD  Active Self  Blood Glucose Monitoring Suppl (ACCU-CHEK GUIDE) w/Device KIT [id number]  1 each by Does not apply route 2 (two) times daily.  Lorella Nimrod, MD  Active Self  busPIRone (BUSPAR) 10 MG tablet [id number]  Take 1 tablet (10 mg total) by mouth 3 (three) times daily.  Patient taking differently: Take 10 mg by mouth 2 (two) times daily.   Salley Slaughter, NP  Active Self  cholecalciferol (VITAMIN D3) 25 MCG (1000 UNIT) tablet [id number]  Take 1,000 Units by mouth daily. [provider]  Active Self  citalopram (CELEXA) 20 MG tablet [id number]  TAKE 1 TABLET BY MOUTH ONCE DAILY. DO NOT TAKE WHILE TAKING DULOXETINE Salley Slaughter, NP  Active Self  clopidogrel (PLAVIX) 75 MG tablet [id number]  TAKE 1 TABLET BY MOUTH IN THE [name], MD  Active Self  Continuous Blood Gluc Sensor (FREESTYLE LIBRE 3 SENSOR) [place] [id number]  Place 1 sensor on the skin every 14 days. Use to check glucose continuously [name], MD  Active Self  Dulaglutide (TRULICITY) 4.5 YB/6.3SL SOPN [id number]  Inject 4.5 mg as directed once a week. Drucie Opitz, MD  Active Self           Med Note Terrilyn Saver Jan 07, 2023 11:24 AM) Saturdays  Evolocumab (REPATHA SURECLICK) 115 MG/ML [name] [id number]  INJECT 1 PEN INTO THE SKIN EVERY 14 DAYS Burnell Blanks, MD  Active Self  exemestane (AROMASIN) 25 MG tablet [id number]  TAKE 1 TABLET BY MOUTH ONCE DAILY AFTER Clint Lipps, MD  Active Self  ezetimibe (ZETIA) 10 MG tablet [id number]  Take 1 tablet (10 mg total) by mouth daily. [name], PA-C  Expired 01/07/23 2359 Self  ferrous sulfate 325 (65 FE) MG tablet [id number]  Take 1 tablet (325 mg total) by mouth every other day. [name], MD  Active Self  Fluticasone-Umeclidin-Vilant (TRELEGY ELLIPTA) 100-62.5-25 MCG/ACT AEPB [id number]  INHALE 1 PUFF ONCE DAILY Strength: 100-62.5-25 MCG/ACT Lacinda Axon, MD  Active Self  furosemide (LASIX) 40 MG tablet [id number]  Take 1 tablet (40 mg total) by mouth as needed for fluid or edema. Dorethea Clan, DO  Active Self  gabapentin (NEURONTIN) 300  MG capsule [id number]  TAKE 2 CAPSULES BY MOUTH IN THE MORNING AND TAKE 3 CAPSULES AT BEDTIME [name], MD  Active Self  glucose blood (ACCU-CHEK GUIDE) test strip [id number]  Check blood sugar 2 times per day [name], MD  Active Self  hydrocortisone cream 1 % [id number]  Apply 1 Application topically daily as needed for itching. [provider]  Active Self  LANTUS SOLOSTAR 100 UNIT/ML Solostar Pen [id number]  Inject 30 Units into the skin at bedtime. [provider]  Active Self  losartan-hydrochlorothiazide (HYZAAR) 100-25 MG tablet [id number]  Take 1 tablet by mouth daily. [name], MD  Expired 01/07/23 2359 Self  metFORMIN (GLUCOPHAGE-XR) 500 MG 24 hr tablet [id number]  Take 1 tablet (500 mg total) by mouth 2 (two) times daily. [name], MD  Active Self  metoprolol succinate (TOPROL-XL) 50 MG 24 hr tablet [id number]  TAKE 1 TABLET BY MOUTH ONCE DAILY WITH MEALS OR  IMMEDIATELY  FOLLOWING  A  MEAL [name], MD  Active Self  montelukast (SINGULAIR) 10 MG tablet [id number]  Take 1 tablet (10 mg total) by mouth at bedtime. [name], MD  Active Self  nitrofurantoin, macrocrystal-monohydrate, (MACROBID) 100 MG capsule [id number]  Take 1 capsule (100 mg total) by mouth daily. [place]king, Reece Leader., MD  Active Self  senna-docusate (SENNA-TIME S) 8.6-50 MG tablet [id number]  Take 1 tablet by mouth at bedtime as needed for mild constipation. [name], MD  Active Self  traZODone (DESYREL) 50 MG tablet [id number]  TAKE 1/2 TO 1 (ONE-HALF TO ONE) TABLET BY MOUTH AT BEDTIME  Patient taking differently: Take 50 mg by mouth at bedtime.   Salley Slaughter, NP  Active Self  VENTOLIN HFA 108 (90 Base) MCG/ACT inhaler [id number]  INHALE 2 PUFFS BY MOUTH EVERY 6 HOURS AS NEEDED FOR WHEEZING FOR SHORTNESS OF Loyal Gambler, MD  Active Self            Patient Active Problem List   Diagnosis Date Noted   History of UTI 12/05/2022    Sepsis due to Escherichia coli with encephalopathy and septic shock ([place]) 11/17/2022   COPD exacerbation ([place]) 11/16/2022   Diabetic neuropathy ([place]) 08/29/2022   Vaginal lesion 08/29/2022   PTSD (post-traumatic stress disorder) 06/24/2022   GAD (generalized anxiety disorder) 06/24/2022   Major depressive disorder, recurrent episode, moderate ([place]) 06/24/2022   Urinary incontinence 06/20/2022   Intertrigo 06/20/2022   Arthritis of left shoulder region    S/P reverse total shoulder arthroplasty, 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   Healthcare maintenance 11/17/2021   Spondylolisthesis, lumbar region 02/11/2021    Class: Acute   Malignant neoplasm of upper-outer quadrant of right breast in female, estrogen receptor positive ([place]) 06/10/2018   Restless leg syndrome 02/04/2018   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   Depression 08/06/2015   Hypertension associated with diabetes ([place])    Aortic stenosis    Chronic diastolic congestive heart failure ([place])    COPD (chronic obstructive pulmonary disease) ([place]) 09/21/2013   Obstructive sleep apnea 09/21/2013   Nephrolithiasis 09/21/2013   Nocturnal hypoxemia 09/12/2013   Hyperlipidemia 11/30/2012   History of pulmonary embolus ([place]) 07/09/2012   Normocytic anemia 02/08/2012   Type 2 diabetes mellitus without complication, without long-term current use of insulin ([place]) 02/08/2012   Asthma 02/08/2012   GERD (gastroesophageal reflux disease)    CAD (coronary artery disease) 12/08/2011    Conditions to be addressed/monitored per PCP order:  Depression  Care Plan : LCSW Plan of Care  Updates made by Greg Cutter, LCSW since 01/09/2023 12:00 AM     Problem: Depression Identification (Depression)      Long-Range Goal: Depressive Symptoms Identified   Start Date: 04/17/2022  Recent Progress: On track  Note:   Long-Range Goal: I want to start mental health treatment    Start Date: 04/17/2022  Priority: High  Note:   Priority: High    Timeframe:  Long-Range Goal Priority:  High Start Date:   04/17/22                Expected End Date:  ongoing                 Follow Up Date--01/07/22 at 11 am  - check out counseling - keep 90 percent of counseling appointments - schedule counseling appointment    Why is this important?             Beating depression may take some time.            If you don't feel better right away, don't give up on your treatment plan.     Current barriers:   Chronic Mental Health  needs related to depression, stress and anxiety. Patient requires Support, Education, Resources, Referrals, Advocacy, and Care Coordination, in order to meet Unmet Mental Health Needs. Patient will implement clinical interventions discussed today to decrease symptoms of depression and increase knowledge and/or ability of: coping skills. Mental Health Concerns and Social Isolation Patient lacks knowledge of available community counseling agencies and resources.   Clinical Goal(s): verbalize understanding of plan for management of Anxiety, Depression, and Stress and demonstrate a reduction in symptoms. Patient will connect with a provider for ongoing mental health treatment, increase coping skills, healthy habits, self-management skills, and stress reduction        Clinical Interventions:  Assessed patient's previous and current treatment, coping skills, support system and barriers to care. Patient provided history. Patient and spouse lived in a shared home with two other adults. Patient reports that this is a current and stable living situation.  Verbalization of feelings encouraged, motivational interviewing employed Emotional support provided, positive coping strategies explored Self care/establishing healthy boundaries emphasized Patient reports that she takes medication for anxiety and depression. She reports that this has alleviated some of symptoms but not all. She reports that she still has crying spells. She needs help  with finding psychiatry and counseling. She was successful in identifying triggers to anxiety and depression symptoms, in addition, to healthy coping skills.  Patient reports significant worsening anxiety and depression impacting her ability to function appropriately and carry out daily task. Patient receives strong support from spouse but he does not have anxiety or depression and is unable to relate to her and her daily mental health struggles.  Patient is agreeable to referral to [facility] for counseling and psychiatry. [facility] LCSW made referral on 04/17/22. Patient will call [facility] tomorrow on 04/18/22 to schedule counseling and psychiatry appointments. Email sent to her with [facility]'s contact information.  LCSW provided education on relaxation techniques such as meditation, deep breathing, massage, grounding exercises or yoga that can activate the body's relaxation response and ease symptoms of stress and anxiety. LCSW ask that when pt is struggling with difficult emotions and racing thoughts that they start this relaxation response process. LCSW provided extensive education on healthy coping skills for anxiety. SW used active and reflective listening, validated patient's feelings/concerns, and provided emotional support. Patient will work on implementing appropriate self-care habits into their daily routine such as: staying positive, writing a gratitude list, drinking water, staying active around the house, taking their medications as prescribed, combating negative thoughts or emotions and staying connected with their family and friends. Positive reinforcement provided for this decision to work on this. Patient was receptive to anxiety and depression management coping skill education. She will try to increase her socialization over the next 90 days to decrease isolation.  Patient has problems with sleep disturbance. LCSW provided education on healthy sleep hygiene and what that looks like. LCSW encouraged patient  to implement a night time routine into her schedule that works best for her and that she is able to maintain. Advised patient to implement deep breathing/grounding/meditation/self-care exercises into her nightly routine to combat racing thoughts at night. LCSW encouraged patient to wake up at the same time each day, make their sleeping environment comfortable, exercise when able, to limit naps and to not eat or drink anything right before bed.   Motivational Interviewing employed Depression screen reviewed  PHQ2/ PHQ9 completed Mindfulness or Relaxation training provided Active listening / Reflection utilized  Advance Care and HCPOA education provided Emotional Support Provided Problem [place] strategies  reviewed Provided psychoeducation for mental health needs  Provided brief CBT  Reviewed mental health medications and discussed importance of compliance:  Quality of sleep assessed & Sleep Hygiene techniques promoted  Participation in counseling encouraged  Verbalization of feelings encouraged  Suicidal Ideation/Homicidal Ideation assessed: Patient denies SI/HI  Review resources, discussed options and provided patient information about  [place] care team collaboration (see longitudinal plan of care) UPDATE 05/21/22- Patient was successfully set up with counseling at [facility] but was not set up with psychiatry. She is in need of both services. Patient has an upcoming counseling session at [facility] on 06/24/22. [facility] LCSW completed joint call with patient to [facility] but was unable to reach anyone. [facility] LCSW left a voice message asking for them to return call to patient to get her scheduled with psychiatry as [facility] LCSW ask for both psychiatry and counseling in referral. [facility] LCSW ask if they needed an additional referral to please let [facility] LCSW know so this can be completed. [facility] LCSW sent patient an email with [facility]'s contact information including their address as  well as a list of coping skills for depression and anxiety. Patient's spouse has multiple health conditions and tells patient that he "wishes to die" often which concerns her. [facility] LCSW provided emotional support and brief coping skill education on stress management.  UPDATE 06/12/22- Patient successfully contacted [facility] and set up appointments for both psychiatry and counseling. Patient is eager to start treatment. Brief self-care education provided to patient. Patient is agreeable to contact [facility] LCSW directly if needed. [facility] LCSW will make one last follow up on call to ensure that patient was successfully connected to a long term mental health provider. Patient denies any current crises or concerns.  UPDATE 07/18/22- Patient has not been set up with counseling at [facility] and she has completed her first counseling session but she has not been set up with psychiatry still. She has made several calls to agency but has been unsuccessful in reaching them. Patient has an in person appointment at [facility] on 07/30/22 and she will ask them to enroll her in psychiatry at that time. [facility] LCSW will follow up and continue to support patient as needed. UPDATE 08/01/22- Patient reports that she is doing extremely well and that her mood has lifted over the last few weeks. She reports that her first counselor appointment well very well and she was able to build rapport with him. She will see her counselor 2X per month. Her next appointment for therapy is on 08/20/22. Patient has her initial appointment with psychiatry is on the 08/12/22. UPDATE- Patient successfully completed her psychiatry appointment and has a follow up scheduled in two months. However, patient reports having ongoing issues with sleep even with the medication and reports needing this medication to be adjusted. Pt will discuss this concern with her therapist on 09/09/22 but was encouraged to call [facility] before then if needed to address this issue. [facility] LCSW will follow up in two  weeks. UPDATE- Patient expressed to her counselor about her ongoing issues with her sleep and he informed her to tell her psychiatrist. [facility] LCSW sent message to her psychiatrist with this medication dosage adjustment request. [facility] LCSW educated patient on healthy sleep hygiene. Patient denies any current crises. Patient is agreeable to contact [facility] herself to inquire about her ongoing sleep issues. UPDATE 10/17- Patient continues to struggle with ongoing sleep issues and has decided not to call her psychiatrist but wait until her scheduled appointment  on 11/06/22 to address these concerns. [facility] LCSW suggested against this as patient will not been seen in over 4 weeks but patient reports that she can manage until then. Healthy sleep and hygiene techniques were provided to patient. Patient will continue to try as much healthy self-care into her daily routine to combat these negative symptoms. [facility] LCSW will follow up the day after her psychiatry appointment. UPDATE- Patient met with counselor and psychiatrist yesterday. She reports that there was an adjustment in her medicine in order to improve her sleep. Patient reports that her sleep has somewhat improved. She reports that she was able to sign up successfully for Social Security. 01/09/23- Patient contacted [facility] Management department and left a message requesting a return call from [facility] LCSW. [facility] LCSW completed call and was informed that patient had called [facility] yesterday and was on hold for a hour. She states that she is really needing to get a new medicine for sleep. The nurses in the back said that she would need to arrange a telephone call for Dr. Alean Rinne to reach out to assist with this bc she hasn't seen Dr. Alean Rinne in 3 months and will not until next month. Patient as left on hold and eventually hung up. In basket message was sent to staff and psychiatrist at [facility] and these messages were read and hopefully appointment will be scheduled.   Patient  Goals/Self-Care Activities: Over the next 120 days Attend scheduled medical appointments Utilize healthy coping skills and supportive resources discussed Contact PCP with any questions or concerns Keep 90 percent of counseling appointments Call your insurance provider for more information about your Enhanced Benefits  Check out counseling resources provided  Begin personal counseling with LCSW, to reduce and manage symptoms of Depression and Stress, until well-established with mental health provider Accept all calls from representative with [facility] in an effort to establish ongoing mental health counseling and supportive services. Incorporate into daily practice - relaxation techniques, deep breathing exercises, and mindfulness meditation strategies. Talk about feelings with friends, family members, spiritual advisor, etc. Contact LCSW directly [phone]), if you have questions, need assistance, or if additional social work needs are identified between now and our next scheduled telephone outreach call. Call 988 for mental health hotline/crisis line if needed (24/7 available) Try techniques to reduce symptoms of anxiety/negative thinking (deep breathing, distraction, positive self talk, etc)  - develop a personal safety plan - develop a plan to deal with triggers like holidays, anniversaries - exercise at least 2 to 3 times per week - have a plan for how to handle bad days - journal feelings and what helps to feel better or worse - spend time or talk with others at least 2 to 3 times per week - watch for early signs of feeling worse - begin personal counseling - call and visit an old friend - check out volunteer opportunities - join a support group - laugh; watch a funny movie or comedian - learn and use visualization or guided imagery - perform a random act of kindness - practice relaxation or meditation daily - start or continue a personal journal - practice positive thinking and  self-talk -continue with compliance of taking medication        05/21/2022   12:53 PM 04/17/2022    3:17 PM 11/14/2021    4:02 PM 08/21/2021    4:33 PM 12/10/2020    4:00 PM  Depression screen PHQ 2/9  Decreased Interest '2 2 1 '$ 0 1  Down, Depressed, Hopeless  $'2 2 1 'F$ 0 0  PHQ - 2 Score '4 4 2 '$ 0 1  Altered sleeping '3 3 3   1  '$ Tired, decreased energy '3 3 1   1  '$ Change in appetite '3 3 1   1  '$ Feeling bad or failure about yourself  0 1 0   0  Trouble concentrating 0 0 0   0  Moving slowly or fidgety/restless 0   0   0  Suicidal thoughts 0 0 0   0  PHQ-9 Score '13 14 7   4  '$ Difficult doing work/chores Somewhat difficult Somewhat difficult Not difficult at all   Not difficult at all        24- Hour Availability:    [facility]  [address] [place], [place] Crisis [phone]   [facility] [place]   [place]  [phone]    [place]  [phone] (after hours)   Therapeutic Alternative/Mobile Crisis   [phone]   [place] National Suicide Hotline  (410) 378-0146 (TALK) OR 988   Call 911 or go to emergency room   [facility]  [phone]);  [place] and [employer]  [phone]); [place], [place], [place], [place], [place], [place], [place]           Follow up:  Patient agrees to Care Plan and Follow-up.  Plan: The Managed Medicaid care management team will reach out to the patient again over the next 30 days.  Date/time of next scheduled Social Work care management/care coordination outreach:  01/19/23 at [place], [place], MSW, [place] Medicaid LCSW [email or web address] Phone: [phone]

## 2023-01-09 NOTE — Telephone Encounter (Signed)
Patient informed Probation officer that she continues to have issues with sleep.  She notes that she sleeps approximately 3 to 4 hours nightly.  Patient informed writer that she doubled her trazodone dose without success.  Provider discussed switching to mirtazapine, doxepin, Seroquel, or Ambien.  Patient informed Probation officer that currently she does not wish to gain weight as she is currently overweight.  She informed Probation officer that she is attempting to lose weight to help manage her physical comorbidities.  She informed Probation officer that her anxiety and depression are well-managed.  Today she denies SI/HI/VAH, mania, paranoia.  Provider informed patient that Ambien '5mg'$  nightly as needed can be trialed for a few months.Potential side effects of medication and risks vs benefits of treatment vs non-treatment were explained and discussed. All questions were answered.  No other concerns noted at this time.

## 2023-01-09 NOTE — Patient Outreach (Signed)
Medicaid Managed Care   Nurse Care Manager Note  01/09/2023 Name:  [name] MRN:  [id number] DOB:  June 27, 1961  [name] is an 62 y.o. year old female who is a primary patient of [name] Dame, MD.  The [facility] Managed Care Coordination team was consulted for assistance with:    Chronic healthcare management needs, HTN, DM, HF, asthma, COPD, OSA, anxiety/depression/PTSD, arthritis. GERD, CAD, HLD, h/o breast cancer, RLS  [name] was given information about Medicaid Managed Care Coordination team services today. [name] Patient agreed to services and verbal consent obtained.  Engaged with patient by telephone for follow up visit in response to provider referral for case management and/or care coordination services.   Assessments/Interventions:  Review of past medical history, allergies, medications, health status, including review of consultants reports, laboratory and other test data, was performed as part of comprehensive evaluation and provision of chronic care management services.  SDOH (Social Determinants of Health) assessments and interventions performed: SDOH Interventions    Flowsheet Row Patient Outreach Telephone from 01/09/2023 in [place] Most recent reading at 01/09/2023  3:56 PM Patient Outreach Telephone from 01/09/2023 in [place] Most recent reading at 01/09/2023 10:40 AM Patient Outreach Telephone from 12/08/2022 in [place] Most recent reading at 12/08/2022 10:22 AM ED to Hosp-Admission (Discharged) from 11/16/2022 in [facility] Most recent reading at 11/16/2022  5:56 PM Patient Outreach Telephone from 11/07/2022 in [place] Most recent reading at 11/07/2022  2:14 PM Patient Outreach Telephone from 11/07/2022 in [place] Most recent reading at 11/07/2022  11:06 AM  SDOH Interventions        Housing Interventions -- -- -- Intervention Not Indicated -- --  Alcohol Usage Interventions -- -- Intervention Not Indicated (Score <7) -- -- --  Financial Strain Interventions -- -- Intervention Not Indicated -- -- --  Physical Activity Interventions Other (Comments)  [patient not able to engage in this level of exercise] -- -- -- Other (Comments)  [patient not physically able to engage in moderate to strenuous exercise] --  Stress Interventions -- [employer], Provide Counseling -- -- -- Provide Counseling, Offered [employer]  Social Connections Interventions Intervention Not Indicated -- -- -- Intervention Not Indicated --     Care Plan  No Known Allergies  Medications Reviewed Today     Reviewed by [name] (Registered Nurse) on 01/08/23 at 1413  Med List Status: <None>   Medication Order Taking? Sig Documenting Provider Last Dose Status Informant  Accu-Chek FastClix Lancets MISC [id number]  Check blood sugar two times a day [name] Nimrod, MD  Active Self  acetaminophen (TYLENOL) 500 MG tablet [id number]  Take 1,000 mg by mouth every 6 (six) hours as needed for moderate pain. [provider]  Active Self  albuterol (PROVENTIL) (2.5 MG/3ML) 0.083% nebulizer solution [id number]  USE 1 VIAL IN NEBULIZER EVERY 4 HOURS AS NEEDED FOR WHEEZING OR SHORTNESS OF BREATH Amponsah, Charisse March, MD  Active Self  amlodipine-atorvastatin (CADUET) 5-80 MG tablet [id number]  Take 1 tablet by mouth daily. [name] Aurora, DO  Active Self  ASPIRIN ADULT LOW STRENGTH 81 MG EC tablet [id number]  TAKE 1 Tablet BY MOUTH ONCE DAILY  Patient not taking: Reported on 01/07/2023   [name] Nimrod, MD  Active Self  B-D UF III MINI PEN NEEDLES 31G X 5 MM MISC [id number]  USE AS DIRECTED DAILY [name] Pou, MD  Active Self  Blood Glucose Monitoring Suppl (ACCU-CHEK GUIDE) w/Device KIT [id number]  1 each by Does not apply  route 2 (two) times daily. [name] Nimrod, MD  Active Self  busPIRone (BUSPAR) 10 MG tablet [id number]  Take 1 tablet (10 mg total) by mouth 3 (three) times daily.  Patient taking differently: Take 10 mg by mouth 2 (two) times daily.   [name] Slaughter, NP  Active Self  cholecalciferol (VITAMIN D3) 25 MCG (1000 UNIT) tablet [id number]  Take 1,000 Units by mouth daily. [provider]  Active Self  citalopram (CELEXA) 20 MG tablet [id number]  TAKE 1 TABLET BY MOUTH ONCE DAILY. DO NOT TAKE WHILE TAKING DULOXETINE [name] Slaughter, NP  Active Self  clopidogrel (PLAVIX) 75 MG tablet [id number]  TAKE 1 TABLET BY MOUTH IN THE [name], MD  Active Self  Continuous Blood Gluc Sensor (FREESTYLE LIBRE 3 SENSOR) [place] [id number]  Place 1 sensor on the skin every 14 days. Use to check glucose continuously [name] Dame, MD  Active Self  Dulaglutide (TRULICITY) 4.5 HA/1.9FX SOPN [id number]  Inject 4.5 mg as directed once a week. [name] Opitz, MD  Active Self           Med Note Terrilyn Saver Jan 07, 2023 11:24 AM) Saturdays  Evolocumab (REPATHA SURECLICK) 329 MG/ML [name] [id number]  INJECT 1 PEN INTO THE SKIN EVERY 14 DAYS [name] Blanks, MD  Active Self  exemestane (AROMASIN) 25 MG tablet [id number]  TAKE 1 TABLET BY MOUTH ONCE DAILY AFTER [name] Lipps, MD  Active Self  ezetimibe (ZETIA) 10 MG tablet [id number]  Take 1 tablet (10 mg total) by mouth daily. [name] Burn, PA-C  Expired 01/07/23 2359 Self  ferrous sulfate 325 (65 FE) MG tablet [id number]  Take 1 tablet (325 mg total) by mouth every other day. [name], MD  Active Self  Fluticasone-Umeclidin-Vilant (TRELEGY ELLIPTA) 100-62.5-25 MCG/ACT AEPB [id number]  INHALE 1 PUFF ONCE DAILY Strength: 100-62.5-25 MCG/ACT Lacinda Axon, MD  Active Self  furosemide (LASIX) 40 MG tablet [id number]  Take 1 tablet (40 mg total) by mouth as needed for fluid or edema. Dorethea Clan, DO  Active Self   gabapentin (NEURONTIN) 300 MG capsule [id number]  TAKE 2 CAPSULES BY MOUTH IN THE MORNING AND TAKE 3 CAPSULES AT BEDTIME [name] Dame, MD  Active Self  glucose blood (ACCU-CHEK GUIDE) test strip [id number]  Check blood sugar 2 times per day [name] Dame, MD  Active Self  hydrocortisone cream 1 % [id number]  Apply 1 Application topically daily as needed for itching. [provider]  Active Self  LANTUS SOLOSTAR 100 UNIT/ML Solostar Pen [id number]  Inject 30 Units into the skin at bedtime. [provider]  Active Self  losartan-hydrochlorothiazide (HYZAAR) 100-25 MG tablet [id number]  Take 1 tablet by mouth daily. [name] Dame, MD  Expired 01/07/23 2359 Self  metFORMIN (GLUCOPHAGE-XR) 500 MG 24 hr tablet [id number]  Take 1 tablet (500 mg total) by mouth 2 (two) times daily. [name] Dame, MD  Active Self  metoprolol succinate (TOPROL-XL) 50 MG 24 hr tablet [id number]  TAKE 1 TABLET BY MOUTH ONCE DAILY WITH MEALS OR  IMMEDIATELY  FOLLOWING  A  MEAL [name] Dame, MD  Active Self  montelukast (SINGULAIR) 10 MG tablet [id number]  Take 1 tablet (10 mg total) by mouth at bedtime. [name] Dame, MD  Active Self  nitrofurantoin, macrocrystal-monohydrate, (MACROBID) 100 MG capsule [id number]  Take 1 capsule (100 mg total) by mouth daily. Stoneking, Reece Leader., MD  Active Self  senna-docusate (SENNA-TIME S) 8.6-50 MG tablet [id number]  Take 1 tablet by mouth at bedtime as needed for mild constipation. [name], MD  Active Self  traZODone (DESYREL) 50 MG tablet [id number]  TAKE 1/2 TO 1 (ONE-HALF TO ONE) TABLET BY MOUTH AT BEDTIME  Patient taking differently: Take 50 mg by mouth at bedtime.   [name] Slaughter, NP  Active Self  VENTOLIN HFA 108 (90 Base) MCG/ACT inhaler [id number]  INHALE 2 PUFFS BY MOUTH EVERY 6 HOURS AS NEEDED FOR WHEEZING FOR SHORTNESS OF Loyal Gambler, MD  Active Self           Patient Active Problem List   Diagnosis Date Noted    History of UTI 12/05/2022   Sepsis due to Escherichia coli with encephalopathy and septic shock ([place]) 11/17/2022   COPD exacerbation ([place]) 11/16/2022   Diabetic neuropathy ([place]) 08/29/2022   Vaginal lesion 08/29/2022   PTSD (post-traumatic stress disorder) 06/24/2022   GAD (generalized anxiety disorder) 06/24/2022   Major depressive disorder, recurrent episode, moderate ([place]) 06/24/2022   Urinary incontinence 06/20/2022   Intertrigo 06/20/2022   Arthritis of left shoulder region    S/P reverse total shoulder arthroplasty, 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   Healthcare maintenance 11/17/2021   Spondylolisthesis, lumbar region 02/11/2021    Class: Acute   Malignant neoplasm of upper-outer quadrant of right breast in female, estrogen receptor positive ([place]) 06/10/2018   Restless leg syndrome 02/04/2018   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   Depression 08/06/2015   Hypertension associated with diabetes ([place])    Aortic stenosis    Chronic diastolic congestive heart failure (HCC)    COPD (chronic obstructive pulmonary disease) ([place]) 09/21/2013   Obstructive sleep apnea 09/21/2013   Nephrolithiasis 09/21/2013   Nocturnal hypoxemia 09/12/2013   Hyperlipidemia 11/30/2012   History of pulmonary embolus (PE) 07/09/2012   Normocytic anemia 02/08/2012   Type 2 diabetes mellitus without complication, without long-term current use of insulin ([place]) 02/08/2012   Asthma 02/08/2012   GERD (gastroesophageal reflux disease)    CAD (coronary artery disease) 12/08/2011   Conditions to be addressed/monitored per PCP order:  Chronic healthcare management needs, HTN, DM, HF, asthma, COPD, OSA, anxiety/depression/PTSD, arthritis. GERD, CAD, HLD, h/o breast cancer, RLS  Care Plan : [name] Care Manager Plan of Care  Updates made by Gayla Medicus, [name] since 01/09/2023 12:00 AM     Problem: Health Promotion or Disease Self-Management (General Plan of Care)   Priority: High  Onset Date: 04/14/2022      Long-Range Goal: Chronic Disease Management and Care Coordination Needs   Start Date: 04/14/2022  Expected End Date: 04/10/2023  Recent Progress: Not on track  Priority: High  Note:   Current Barriers:  Knowledge Deficits related to plan of care for management of Anxiety and Depression along with other chronic health conditions, HTN, DM, HF, asthma, COPD, OSA, arthritis, GERD, CAD, HLD, h/o breast cancer, RLS Chronic Disease Management support and education needs related to Anxiety and Depression. DM, HTN, HF, asthma, OSA, arthritis, GERD, CAD, HLD, h/o breast cancer, RLS. 01/09/23:  patient with issues trying to sleep-provider prescribed Ambien today.  To have urinary stent placement 01/13/23 and ECHO 01/26/23.  Not checking BP-124/60 at [facility] appt 12/28.  Awaiting new Freestyle Libre-blood sugar 120 at pre op appt yesterday.   RNCM Clinical Goal(s):  Patient will verbalize understanding of plan for management of Anxiety and Depression, DM,  HTN  as evidenced by patient report take all medications exactly as prescribed and will call provider for medication related questions as evidenced by patient report demonstrate understanding of rationale for each prescribed medication as evidenced by patient report attend all scheduled medical appointments  as evidenced by patient report continue to work with [name] Care Manager to address care management and care coordination needs related to  Anxiety and Depression, DM, HTN as evidenced by adherence to CM Team Scheduled appointments work with Education officer, museum to address  related to the management of Mental Health Concerns  related to the management of Anxiety and Depression as evidenced by review of EMR and patient or Education officer, museum report through collaboration with Consulting civil engineer, provider, and care team.   Interventions: Inter-disciplinary care team collaboration (see longitudinal plan of care) Evaluation of current treatment plan related to  self management and  patient's adherence to plan as established by provider   (Status:  New goal.)  Long Term Goal Evaluation of current treatment plan related to Anxiety and Depression, Mental Health Concerns  self-management and patient's adherence to plan as established by provider. Discussed plans with patient for ongoing care management follow up and provided patient with direct contact information for care management team Advised patient to contact provider for any additional DME needs, CBG, and possible general surgeon referral. Reviewed medications with patient Collaborated with LCSW regarding anxiety/depression Reviewed scheduled/upcoming provider appointments Advised patient, providing education and rationale, to check cbg and record, calling provider  for findings outside established parameters  Social Work referral for anxiety/depression Discussed plans with patient for ongoing care management follow up and provided patient with direct contact information for care management team Assessed social determinant of health barriers Collaborated with [place] Medicaid Liaison regarding DME-motorized wheelchair and lift chair.  Diabetes Interventions:  (Status:  New goal.) Long Term Goal Assessed patient's understanding of A1c goal: <7% Reviewed medications with patient and discussed importance of medication adherence Reviewed scheduled/upcoming provider appointments  Advised patient, providing education and rationale, to check cbg and record, calling provider for findings outside established parameters Review of patient status, including review of consultants reports, relevant laboratory and other test results, and medications completed Assessed social determinant of health barriers Lab Results  Component Value Date   HGBA1C 7.1 (A) 08/27/2022  HGB 6.6 on 01/08/23  Hypertension Interventions:  (Status:  New goal.) Long Term Goal Last practice recorded BP readings:  BP Readings from  Last 3 Encounters:  09/12/22 133/69  08/27/22 (!) 109/58  07/01/22 137/75  12/25/22          124/60  Most recent eGFR/CrCl:  Lab Results  Component Value Date   EGFR 90 06/04/2022    No components found for: "CRCL"  Evaluation of current treatment plan related to hypertension self management and patient's adherence to plan as established by provider Reviewed medications with patient and discussed importance of compliance Discussed plans with patient for ongoing care management follow up and provided patient with direct contact information for care management team Advised patient, providing education and rationale, to monitor blood pressure daily and record, calling PCP for findings outside established parameters Reviewed scheduled/upcoming provider appointments including:  Assessed social determinant of health barriers   Patient Goals/Self-Care Activities: Take all medications as prescribed Attend all scheduled provider appointments Call pharmacy for medication refills 3-7 days in advance of running out of medications Perform all self care activities independently  Perform IADL's (shopping, preparing meals, housekeeping, managing finances) independently Call provider  office for new concerns or questions  Work with the social worker to address care coordination needs and will continue to work with the clinical team to address health care and disease management related needs  Follow Up Plan:  The patient has been provided with contact information for the care management team and has been advised to call with any health related questions or concerns.  The care management team will reach out to the patient again over the next 30 business  days.    Long-Range Goal: Establish Plan of Care for Chronic Disease Management Needs   Start Date: 04/14/2022  Expected End Date: 07/14/2022  Priority: High  Note:   Timeframe:  Long-Range Goal Priority:  High Start Date:      04/14/22                        Expected End Date:     ongoing                  Follow Up Date 02/06/23   - schedule appointment for flu shot - schedule appointment for vaccines needed due to my age or health - schedule recommended health tests (blood work, mammogram, colonoscopy, pap test) - schedule and keep appointment for annual check-up    Why is this important?   Screening tests can find diseases early when they are easier to treat.  Your doctor or nurse will talk with you about which tests are important for you.  Getting shots for common diseases like the flu and shingles will help prevent them.  01/09/23:  Seen and evaluated by [facility] 12/25/22.  To have urinary stent placement 01/13/23 and ECHO 01/26/23   Follow Up:  Patient agrees to Care Plan and Follow-up.  Plan: The Managed Medicaid care management team will reach out to the patient again over the next 30 business  days. and The  Patient has been provided with contact information for the Managed Medicaid care management team and has been advised to call with any health related questions or concerns.  Date/time of next scheduled [name] care management/care coordination outreach:  02/06/23 at 1030.

## 2023-01-13 ENCOUNTER — Ambulatory Visit (HOSPITAL_COMMUNITY): Payer: Medicaid Other

## 2023-01-13 ENCOUNTER — Encounter (HOSPITAL_COMMUNITY)
Admission: RE | Disposition: A | Discharge status: Home / Self Care | Payer: Self-pay | Source: Ambulatory Visit | Attending: Urology

## 2023-01-13 ENCOUNTER — Ambulatory Visit (HOSPITAL_BASED_OUTPATIENT_CLINIC_OR_DEPARTMENT_OTHER): Payer: Medicaid Other | Admitting: Anesthesiology

## 2023-01-13 ENCOUNTER — Ambulatory Visit (HOSPITAL_COMMUNITY)
Admission: RE | Admit: 2023-01-13 | Discharge: 2023-01-13 | Disposition: A | Discharge status: Home / Self Care | Payer: Medicaid Other | Source: Ambulatory Visit | Attending: Urology | Admitting: Urology

## 2023-01-13 ENCOUNTER — Encounter (HOSPITAL_COMMUNITY): Payer: Self-pay | Admitting: Urology

## 2023-01-13 ENCOUNTER — Ambulatory Visit (HOSPITAL_COMMUNITY): Payer: Medicaid Other | Admitting: Physician Assistant

## 2023-01-13 DIAGNOSIS — Z6841 Body Mass Index (BMI) 40.0 and over, adult: Secondary | ICD-10-CM | POA: Diagnosis not present

## 2023-01-13 DIAGNOSIS — E119 Type 2 diabetes mellitus without complications: Secondary | ICD-10-CM | POA: Diagnosis not present

## 2023-01-13 DIAGNOSIS — Z87440 Personal history of urinary (tract) infections: Secondary | ICD-10-CM

## 2023-01-13 DIAGNOSIS — I5032 Chronic diastolic (congestive) heart failure: Secondary | ICD-10-CM | POA: Insufficient documentation

## 2023-01-13 DIAGNOSIS — N132 Hydronephrosis with renal and ureteral calculous obstruction: Secondary | ICD-10-CM | POA: Diagnosis present

## 2023-01-13 DIAGNOSIS — I252 Old myocardial infarction: Secondary | ICD-10-CM | POA: Diagnosis not present

## 2023-01-13 DIAGNOSIS — J4489 Other specified chronic obstructive pulmonary disease: Secondary | ICD-10-CM | POA: Diagnosis not present

## 2023-01-13 DIAGNOSIS — I2510 Atherosclerotic heart disease of native coronary artery without angina pectoris: Secondary | ICD-10-CM | POA: Diagnosis not present

## 2023-01-13 DIAGNOSIS — Z8614 Personal history of Methicillin resistant Staphylococcus aureus infection: Secondary | ICD-10-CM | POA: Insufficient documentation

## 2023-01-13 DIAGNOSIS — N136 Pyonephrosis: Secondary | ICD-10-CM | POA: Insufficient documentation

## 2023-01-13 DIAGNOSIS — I110 Hypertensive heart disease with heart failure: Secondary | ICD-10-CM | POA: Insufficient documentation

## 2023-01-13 DIAGNOSIS — I509 Heart failure, unspecified: Secondary | ICD-10-CM

## 2023-01-13 DIAGNOSIS — Z9981 Dependence on supplemental oxygen: Secondary | ICD-10-CM | POA: Diagnosis not present

## 2023-01-13 DIAGNOSIS — Z87891 Personal history of nicotine dependence: Secondary | ICD-10-CM

## 2023-01-13 DIAGNOSIS — N200 Calculus of kidney: Secondary | ICD-10-CM

## 2023-01-13 DIAGNOSIS — G4730 Sleep apnea, unspecified: Secondary | ICD-10-CM | POA: Insufficient documentation

## 2023-01-13 DIAGNOSIS — J449 Chronic obstructive pulmonary disease, unspecified: Secondary | ICD-10-CM | POA: Diagnosis not present

## 2023-01-13 HISTORY — PX: HOLMIUM LASER APPLICATION: SHX5852

## 2023-01-13 HISTORY — PX: CYSTOSCOPY WITH RETROGRADE PYELOGRAM, URETEROSCOPY AND STENT PLACEMENT: SHX5789

## 2023-01-13 LAB — GLUCOSE, CAPILLARY
Glucose-Capillary: 128 mg/dL — ABNORMAL HIGH (ref 70–99)
Glucose-Capillary: 88 mg/dL (ref 70–99)

## 2023-01-13 SURGERY — CYSTOURETEROSCOPY, WITH RETROGRADE PYELOGRAM AND STENT INSERTION
Anesthesia: General | Laterality: Right

## 2023-01-13 MED ORDER — SODIUM CHLORIDE 0.9 % IR SOLN
Status: DC | PRN
  Administered 2023-01-13: 3000 mL via INTRAVESICAL

## 2023-01-13 MED ORDER — SODIUM CHLORIDE 0.9 % IV SOLN
2.0000 g | Freq: Once | INTRAVENOUS | Status: AC
  Administered 2023-01-13: 2 g via INTRAVENOUS
  Filled 2023-01-13: qty 20

## 2023-01-13 MED ORDER — ACETAMINOPHEN 500 MG PO TABS
1000.0000 mg | ORAL_TABLET | Freq: Once | ORAL | Status: AC
  Administered 2023-01-13: 1000 mg via ORAL
  Filled 2023-01-13: qty 2

## 2023-01-13 MED ORDER — LACTATED RINGERS IV SOLN: INTRAVENOUS | Status: DC

## 2023-01-13 MED ORDER — PHENAZOPYRIDINE HCL 200 MG PO TABS: 200.0000 mg | ORAL_TABLET | Freq: Three times a day (TID) | ORAL | 1 refills | Status: DC | PRN

## 2023-01-13 MED ORDER — PROPOFOL 10 MG/ML IV BOLUS
INTRAVENOUS | Status: DC | PRN
  Administered 2023-01-13: 150 mg via INTRAVENOUS

## 2023-01-13 MED ORDER — SUGAMMADEX SODIUM 500 MG/5ML IV SOLN
INTRAVENOUS | Status: AC
  Filled 2023-01-13: qty 5

## 2023-01-13 MED ORDER — FENTANYL CITRATE (PF) 100 MCG/2ML IJ SOLN
INTRAMUSCULAR | Status: DC | PRN
  Administered 2023-01-13 (×2): 50 ug via INTRAVENOUS

## 2023-01-13 MED ORDER — ONDANSETRON HCL 4 MG/2ML IJ SOLN
INTRAMUSCULAR | Status: DC | PRN
  Administered 2023-01-13: 4 mg via INTRAVENOUS

## 2023-01-13 MED ORDER — DEXAMETHASONE SODIUM PHOSPHATE 4 MG/ML IJ SOLN
INTRAMUSCULAR | Status: DC | PRN
  Administered 2023-01-13: 55 mg via INTRAVENOUS

## 2023-01-13 MED ORDER — PROPOFOL 10 MG/ML IV BOLUS
INTRAVENOUS | Status: AC
  Filled 2023-01-13: qty 20

## 2023-01-13 MED ORDER — FENTANYL CITRATE PF 50 MCG/ML IJ SOSY: 25.0000 ug | PREFILLED_SYRINGE | INTRAMUSCULAR | Status: DC | PRN

## 2023-01-13 MED ORDER — LIDOCAINE HCL URETHRAL/MUCOSAL 2 % EX GEL
CUTANEOUS | Status: AC
  Filled 2023-01-13: qty 30

## 2023-01-13 MED ORDER — MIDAZOLAM HCL 2 MG/2ML IJ SOLN
INTRAMUSCULAR | Status: DC | PRN
  Administered 2023-01-13: 2 mg via INTRAVENOUS

## 2023-01-13 MED ORDER — ROCURONIUM BROMIDE 10 MG/ML (PF) SYRINGE
PREFILLED_SYRINGE | INTRAVENOUS | Status: DC | PRN
  Administered 2023-01-13: 100 mg via INTRAVENOUS

## 2023-01-13 MED ORDER — SUGAMMADEX SODIUM 200 MG/2ML IV SOLN
INTRAVENOUS | Status: DC | PRN
  Administered 2023-01-13: 400 mg via INTRAVENOUS

## 2023-01-13 MED ORDER — FENTANYL CITRATE (PF) 100 MCG/2ML IJ SOLN
INTRAMUSCULAR | Status: AC
  Filled 2023-01-13: qty 2

## 2023-01-13 MED ORDER — CHLORHEXIDINE GLUCONATE 0.12 % MT SOLN
15.0000 mL | Freq: Once | OROMUCOSAL | Status: AC
  Administered 2023-01-13: 15 mL via OROMUCOSAL

## 2023-01-13 MED ORDER — IOHEXOL 300 MG/ML  SOLN
INTRAMUSCULAR | Status: DC | PRN
  Administered 2023-01-13: 4 mL via URETHRAL
  Administered 2023-01-13: 1000 mL via URETHRAL

## 2023-01-13 MED ORDER — PHENYLEPHRINE 80 MCG/ML (10ML) SYRINGE FOR IV PUSH (FOR BLOOD PRESSURE SUPPORT)
PREFILLED_SYRINGE | INTRAVENOUS | Status: AC
  Filled 2023-01-13: qty 10

## 2023-01-13 MED ORDER — PHENYLEPHRINE 80 MCG/ML (10ML) SYRINGE FOR IV PUSH (FOR BLOOD PRESSURE SUPPORT)
PREFILLED_SYRINGE | INTRAVENOUS | Status: DC | PRN
  Administered 2023-01-13: 120 ug via INTRAVENOUS
  Administered 2023-01-13 (×2): 160 ug via INTRAVENOUS

## 2023-01-13 MED ORDER — MIDAZOLAM HCL 2 MG/2ML IJ SOLN
INTRAMUSCULAR | Status: AC
  Filled 2023-01-13: qty 2

## 2023-01-13 MED ORDER — LIDOCAINE 2% (20 MG/ML) 5 ML SYRINGE
INTRAMUSCULAR | Status: DC | PRN
  Administered 2023-01-13: 100 mg via INTRAVENOUS

## 2023-01-13 MED ORDER — ORAL CARE MOUTH RINSE: 15.0000 mL | Freq: Once | OROMUCOSAL | Status: AC

## 2023-01-13 MED ORDER — ROCURONIUM BROMIDE 10 MG/ML (PF) SYRINGE
PREFILLED_SYRINGE | INTRAVENOUS | Status: AC
  Filled 2023-01-13: qty 10

## 2023-01-13 SURGICAL SUPPLY — 20 items
BAG COUNTER SPONGE SURGICOUNT (BAG) IMPLANT
BAG URO CATCHER STRL LF (MISCELLANEOUS) ×1 IMPLANT
BASKET ZERO TIP NITINOL 2.4FR (BASKET) IMPLANT
CATH URETL OPEN END 6FR 70 (CATHETERS) IMPLANT
CLOTH BEACON ORANGE TIMEOUT ST (SAFETY) ×1 IMPLANT
COVER SURGICAL LIGHT HANDLE (MISCELLANEOUS) ×1 IMPLANT
EXTRACTOR STONE NITINOL NGAGE (UROLOGICAL SUPPLIES) IMPLANT
GLOVE SURG LX STRL 7.5 STRW (GLOVE) ×1 IMPLANT
GOWN STRL REUS W/ TWL XL LVL3 (GOWN DISPOSABLE) ×1 IMPLANT
GOWN STRL REUS W/TWL XL LVL3 (GOWN DISPOSABLE) ×1
GUIDEWIRE ANG ZIPWIRE 038X150 (WIRE) IMPLANT
GUIDEWIRE STR DUAL SENSOR (WIRE) ×1 IMPLANT
KIT TURNOVER KIT A (KITS) IMPLANT
MANIFOLD NEPTUNE II (INSTRUMENTS) ×1 IMPLANT
PACK CYSTO (CUSTOM PROCEDURE TRAY) ×1 IMPLANT
PENCIL SMOKE EVACUATOR (MISCELLANEOUS) IMPLANT
SHEATH NAVIGATOR HD 12/14X36 (SHEATH) IMPLANT
STENT URET 6FRX24 CONTOUR (STENTS) IMPLANT
TRACTIP FLEXIVA PULSE ID 200 (Laser) IMPLANT
TUBING CONNECTING 10 (TUBING) ×1 IMPLANT

## 2023-01-13 NOTE — Anesthesia Procedure Notes (Signed)
Procedure Name: Intubation Date/Time: 01/13/2023 1:12 PM  Performed by: Claudia Desanctis, CRNAPre-anesthesia Checklist: Patient identified, Emergency Drugs available, Suction available and Patient being monitored Patient Re-evaluated:Patient Re-evaluated prior to induction Oxygen Delivery Method: Circle system utilized Preoxygenation: Pre-oxygenation with 100% oxygen Induction Type: IV induction Ventilation: Mask ventilation without difficulty Laryngoscope Size: 2 and Miller Grade View: Grade I Tube type: Oral Tube size: 7.0 mm Number of attempts: 1 Airway Equipment and Method: Stylet Placement Confirmation: ETT inserted through vocal cords under direct vision, positive ETCO2 and breath sounds checked- equal and bilateral Tube secured with: Tape Dental Injury: Teeth and Oropharynx as per pre-operative assessment

## 2023-01-13 NOTE — Op Note (Signed)
OPERATIVE NOTE   Patient Name: [name]  MRN: [id number]   Date of Procedure: 01/13/23  Preoperative diagnosis:  Right renal calculus History of UTI  Postoperative diagnosis:  Right renal calculus History of UTI  Procedure:  Cystoscopy Right retrograde pyelogram with intraoperative interpretation Right ureteroscopy Holmium laser lithotripsy Stone manipulation Exchange of right ureteral stent (47F x 24 cm, with tether)   Attending: Primus Bravo, MD  Anesthesia: General  Estimated blood loss: <5 mL  Fluids: Per anesthesia record  Drains: 47F x 24 centimeter ureteral stent on right with tether  Specimens: Stone fragments given to patient  Antibiotics: Rocephin IV  Findings: Normal bladder; right ureteral stent in good position; right renal calculus identified in the lower pole calyx  Indications:  62 year old female initially diagnosed with a right proximal ureteral stone and UTI in November 2023.  She presented with UTI with sepsis and was found to have a 4 mm calculus at the right UPJ with associated obstruction.  She underwent cystoscopy with placement of a right ureteral stent on 11/16/2022.  Urine culture and blood cultures grew E. coli.  She was treated with IV antibiotics and discharged on oral antibiotics.  She was found to have persistent UTI on follow-up and was treated with culture appropriate antibiotics.  She has been on daily Macrobid.  She presents now for cystoscopy, right retrograde pyelogram, right ureteroscopy with laser lithotripsy, and exchange of a right ureteral stent.  The procedure including potential risks were discussed with the patient in detail.  She understands wishes to proceed as described.  Description of Procedure:  The patient received IV Rocephin preoperatively.  After successful induction of a general anesthetic, the patient was placed in the dorsolithotomy position.  The patient's generator draped in sterile fashion.  In  direct visualization, a 21 French cystoscope was passed through the urethra into the bladder.  The bladder was inspected throughout its entirety and noted to be free of any mucosal lesions.  A right ureteral stent was noted emanating from the right ureteral orifice.  Using stent graspers, the distal end of the stent was brought through the urethral meatus.  A sensor guidewire was then passed through the stent and into the right renal pelvis under fluoroscopic guidance.  A right retrograde pyelogram was performed for evaluation of the patient's right upper tract given her history of ureteral obstruction secondary to a calculus.  Scout film showed a nonspecific bowel gas pattern with hardware in the lower spine.  A small calcification was noted in the lower right renal shadow.  The guidewire was noted to be positioned within the right renal pelvis.  A 5 French open-ended catheter was passed over the guidewire and into the proximal ureter.  Contrast was injected.  No obvious filling defect or evidence of obstruction was appreciated.  A 12/14 F ureteral access sheath was placed over the guidewire.  Flexible ureteroscopy was then performed through the access sheath.  No abnormalities were appreciated in the proximal ureter.  Inspection of the renal pelvis showed no abnormalities.  The 4 mm stone was identified in the lower pole calyx.  Using the holmium laser fiber, the stone was fragmented into numerous small fragments.  A dusting setting was used to further fragment remaining pieces.  A Ngage stone basket was used to retrieve several of the larger fragments for stone analysis.  Once fragmentation was completed, the sensor guidewire was replaced and the ureteroscope was removed.  The ureter was inspected with removal of  the ureteroscope and there was no evidence of any ureteral injury.  A 6 French by 24 cm double-J stent was then passed over the guidewire and into the right renal pelvis.  Position was confirmed with  fluoroscopy.  The tether was left attached and brought through the urethral meatus.  The bladder was then drained and the cystoscope was removed.  Intraurethral lidocaine jelly was placed.  Complications: None  Condition: Stable, extubated, transferred to PACU  Plan:  Discharged to home Continue daily antibiotics. Return to office in 1 week for stent removal.

## 2023-01-13 NOTE — Interval H&P Note (Signed)
History and Physical Interval Note:  01/13/2023 12:36 PM  [name]  has presented today for surgery, with the diagnosis of Right Nephrolithiasis.  The various methods of treatment have been discussed with the patient and family. After consideration of risks, benefits and other options for treatment, the patient has consented to  Procedure(s): CYSTOSCOPY WITH RETROGRADE PYELOGRAM, URETEROSCOPY AND STENT PLACEMENT (Right) HOLMIUM LASER APPLICATION (Right) as a surgical intervention.  The patient's history has been reviewed, patient examined, no change in status, stable for surgery.  I have reviewed the patient's chart and labs.  Questions were answered to the patient's satisfaction.     Michaelle Birks

## 2023-01-13 NOTE — Transfer of Care (Signed)
Immediate Anesthesia Transfer of Care Note  Patient: [name]  Procedure(s) Performed: CYSTOSCOPY WITH RETROGRADE PYELOGRAM, URETEROSCOPY AND STENT EXCHANGE (Right) HOLMIUM LASER APPLICATION (Right)  Patient Location: PACU  Anesthesia Type:General  Level of Consciousness: awake and patient cooperative  Airway & Oxygen Therapy: Patient Spontanous Breathing and Patient connected to face mask  Post-op Assessment: Report given to RN and Post -op Vital signs reviewed and stable  Post vital signs: Reviewed and stable  Last Vitals:  Vitals Value Taken Time  BP 150/79 01/13/23 1415  Temp    Pulse 79 01/13/23 1417  Resp 23 01/13/23 1417  SpO2 94 % 01/13/23 1417  Vitals shown include unvalidated device data.  Last Pain:  Vitals:   01/13/23 1127  TempSrc:   PainSc: 0-No pain         Complications: No notable events documented.

## 2023-01-13 NOTE — H&P (Signed)
Assessment: 1. Nephrolithiasis   2. History of UTI   3. Abnormal urine findings       Plan: I discussed the need for definitive management of the right ureteral calculus.  Given her body habitus and the stone size, I would agree that ureteroscopic laser lithotripsy would be the best option for her.   Risks, benefits, alternatives were discussed with the patient in detail.  Potential risks including, but not limited to, infection; bleeding;  injury to urethra, bladder, or ureter; possible need of other treatments; possible failure to remove the calculus; ureteral  stricture formation; cardiac, pulmonary, cerebrovascular events; and anesthetic complications were discussed.  The patient understands and wishes to proceed.    The patient will be scheduled for cystoscopy, right retrograde pyelogram, right ureteroscopic laser lithotripsy, exchange of right ureteral stent at [facility].  Surgical request is placed with the surgery schedulers and will be scheduled at the patient's/family request. Informed consent is given as documented below. Anesthesia: General   The patient does have sleep apnea, history of MRSA, history of VRE, history of cardiac device requiring special anesthetic needs. Patient is stable and considered clear for surgical in an outpatient ambulatory surgery setting as well as patient hospital setting.   Consent for Operation or Procedure: Provider Certification I hereby certify that the nature, purpose, benefits, usual and most frequent risks of, and alternatives to, the operation or procedure have been explained to the patient (or person authorized to sign for the patient) either by me as responsible physician or by the provider who is to perform the operation or procedure. Time spent such that the patient/family has had an opportunity to ask questions, and that those questions have been answered. The patient or the patient's representative has been advised that selected tasks may be  performed by assistants to the primary health care provider(s). I believe that the patient (or person authorized to sign for the patient) understands what has been explained, and has consented to the operation or procedure. No guarantees were implied or made.     Chief Complaint:     Chief Complaint  Patient presents with   Nephrolithiasis      History of Present Illness:   [name] is a 62 y.o. female who is seen for evaluation of right proximal ureteral stone and UTI.  She was seen by Dr. Cain Sieve at [facility] on 11/16/2022 with a proximal right ureteral calculus and UTI with sepsis.  CT imaging from 11/16/2022 showed an obstructing 4 mm calculus at the right UPJ with associated hydronephrosis and punctate nonobstructing left nephrolithiasis.  She underwent cystoscopy with placement of a right ureteral stent on 11/16/2022.  Urine culture grew >100 K E. coli.  Blood cultures were also positive for E. coli.  She was treated with IV antibiotics and discharged on [place]. She is tolerating the stent well with mild stent related symptoms.  She is having occasional gross hematuria.  No fevers or chills.  No flank pain. She is currently on daily Macrobid. She presents today for cystoscopy, retrograde pyelogram, right ureteroscopy, laser lithotripsy, and insertion of right ureteral stent.     Past Medical History:      Past Medical History:  Diagnosis Date   Anemia     Anxiety     Aortic valve stenosis, severe     Arthritis      PAIN AND SEVERE OA LEFT KNEE ; S/P RIGHT TKA ON 02/03/12; HAS LOWER BACK PAIN-UNABLE TO STAND MORE THAN 10  MIN; ARTHRITIS "ALL OVER"   Asthma     Blood transfusion      2013[facility]   Breast cancer in female [facility])      Right   CAD (coronary artery disease)      Cath 2010 with DES x 1 RCA-- PT'S CARDIOLOGIST IS DR. MCALHANY   Chronic diastolic congestive heart failure (HCC)     COPD (chronic obstructive pulmonary disease) (HCC)     Depression     Diabetes  mellitus DIAGNOSED IN2010   Dyspnea      with much ambulation   Eczema      on back   Headache      migraines younger- rare now 02/07/21   Heart murmur      no current problems   History of hiatal hernia     History of kidney stones      passed or blasted   Hyperlipidemia     Hypertension     Morbid obesity with body mass index of 60.0-69.9 in adult [facility])     Myocardial infarction ([place])      PT THINKS SHE WAS DX WITH MI AT THE TIME OF HEART STENTING   Neuromuscular disorder (HCC)      bilateral arm/hands   Oxygen dependent      uses 3L oxygen night/prn   Personal history of radiation therapy     Pneumonia     Pulmonary embolism ([place]) 02/08/2012    S/P RT TOTAL KNEE ON 02/03/12--ON 02/08/12--DEVELOPED ACUTE SOB AND CHEST PAIN--AND DIAGNOSED WITH  PULMONARY EMBOLUS AND PNEUMONIA   Restless leg syndrome     Sleep apnea      uses 3 liters O2 at night as needed   Uterine fibroid      NO PROBLEMS AT PRESENT FROM THE FIBROIDS-STATES SHE IS POST MENOPAUSAL-LAST MENSES 2010 EXCEPT FOR EPISODE THIS YR OF BLEEDING RELATED TO FIBROIDS.   Weakness      BOTH HANDS - S/P BILATERAL CARPAL TUNNEL RELEASE--BUT STILL HAS WEAKNESS--OFTEN DROPS THINGS      Past Surgical History:       Past Surgical History:  Procedure Laterality Date   BACK SURGERY   02/11/2021    Dr Louanne Skye   BREAST BIOPSY Right 06/04/2018   BREAST LUMPECTOMY Right 06/2018   BREAST LUMPECTOMY WITH RADIOACTIVE SEED AND SENTINEL LYMPH NODE BIOPSY Right 07/19/2018    Procedure: BREAST LUMPECTOMY WITH RADIOACTIVE SEED AND SENTINEL LYMPH NODE BIOPSY;  Surgeon: Alphonsa Overall, MD;  Location: [place];  Service: General;  Laterality: Right;   CARDIAC CATHETERIZATION       CARDIAC VALVE REPLACEMENT        2017   CARPAL TUNNEL RELEASE        Bilateral   CHOLECYSTECTOMY       COLONOSCOPY       CORONARY ANGIOPLASTY        2010 has stent in place   CYSTOSCOPY W/ RETROGRADES Right 09/21/2013    Procedure: CYSTOSCOPY WITH RIGHT RETROGRADE  PYELOGRAM RIGHT DOUBLE J STENT ;  Surgeon: Fredricka Bonine, MD;  Location: [facility];  Service: Urology;  Laterality: Right;   CYSTOSCOPY W/ URETERAL STENT PLACEMENT Right 11/16/2022    Procedure: CYSTOSCOPY WITH RETROGRADE PYELOGRAM/URETERAL STENT PLACEMENT;  Surgeon: Vira Agar, MD;  Location: [place];  Service: Urology;  Laterality: Right;   CYSTOSCOPY WITH URETEROSCOPY AND STENT PLACEMENT Right 10/25/2013    Procedure: CYSTOSCOPY RIGHT URETEROSCOPY HOLMIUM LASER LITHO AND STENT PLACEMENT;  Surgeon: Marja Kays  Junious Silk, MD;  Location: [facility];  Service: Urology;  Laterality: Right;   HERNIA REPAIR       INTRAOPERATIVE TRANSESOPHAGEAL ECHOCARDIOGRAM N/A 12/12/2014    Procedure: INTRAOPERATIVE TRANSESOPHAGEAL ECHOCARDIOGRAM;  Surgeon: Burnell Blanks, MD;  Location: [place];  Service: Cardiovascular;  Laterality: N/A;   JOINT REPLACEMENT        bil total knees   KNEE ARTHROPLASTY   02/03/2012    Procedure: COMPUTER ASSISTED TOTAL KNEE ARTHROPLASTY;  Surgeon: Mcarthur Rossetti, MD;  Location: [place];  Service: Orthopedics;  Laterality: Right;  Right total knee arthroplasty   LEFT AND RIGHT HEART CATHETERIZATION WITH CORONARY ANGIOGRAM N/A 03/17/2013    Procedure: LEFT AND RIGHT HEART CATHETERIZATION WITH CORONARY ANGIOGRAM;  Surgeon: Burnell Blanks, MD;  Location: [facility] C[facility] LAB;  Service: Cardiovascular;  Laterality: N/A;   LEFT AND RIGHT HEART CATHETERIZATION WITH CORONARY/GRAFT ANGIOGRAM N/A 09/14/2014    Procedure: LEFT AND RIGHT HEART CATHETERIZATION WITH Beatrix Fetters;  Surgeon: Burnell Blanks, MD;  Location: [facility] CATH LAB;  Service: Cardiovascular;  Laterality: N/A;   REVERSE SHOULDER ARTHROPLASTY Left 03/06/2022    Procedure: LEFT SHOULDER REPLACEMENT APPLICATION OF WOUND VAC;  Surgeon: [name], MD;  Location: [place];  Service: Orthopedics;  Laterality: Left;   TEE WITHOUT CARDIOVERSION N/A 03/14/2013    Procedure: TRANSESOPHAGEAL  ECHOCARDIOGRAM (TEE);  Surgeon: Lelon Perla, MD;  Location: [facility] ENDOSCOPY;  Service: Cardiovascular;  Laterality: N/A;   TEE WITHOUT CARDIOVERSION N/A 11/14/2014    Procedure: TRANSESOPHAGEAL ECHOCARDIOGRAM (TEE);  Surgeon: Lelon Perla, MD;  Location: [facility] ENDOSCOPY;  Service: Cardiovascular;  Laterality: N/A;   TONSILLECTOMY        maybe as a child- does not know   TOTAL KNEE ARTHROPLASTY   09/10/2012    Procedure: TOTAL KNEE ARTHROPLASTY;  Surgeon: Mcarthur Rossetti, MD;  Location[facility];  Service: Orthopedics;  Laterality: Left;   TOTAL KNEE REVISION Right 07/15/2013    Procedure: REVISION ARTHROPLASTY RIGHT KNEE;  Surgeon: Mcarthur Rossetti, MD;  Location[facility];  Service: Orthopedics;  Laterality: Right;   TRANSCATHETER AORTIC VALVE REPLACEMENT, TRANSFEMORAL N/A 12/12/2014    Procedure: TRANSCATHETER AORTIC VALVE REPLACEMENT, TRANSFEMORAL;  Surgeon: Burnell Blanks, MD;  Location: [place];  Service: Cardiovascular;  Laterality: N/A;   TRIGGER FINGER RELEASE   09/10/2012    Procedure: RELEASE TRIGGER FINGER/A-1 PULLEY;  Surgeon: Mcarthur Rossetti, MD;  Location: [facility];  Service: Orthopedics;  Laterality: Right;  Right Ring Finger   TUBAL LIGATION          Allergies:  No Known Allergies   Family History:       Family History  Problem Relation Age of Onset   Breast cancer Mother          stage IV at diagnosis   Emphysema Mother          smoked   Heart disease Mother     COPD Father          smoked   Asthma Father     Heart disease Father     Cancer Brother          Sinus      Social History:  Social History         Tobacco Use   Smoking status: Former      Packs/day: 1.50      Years: 30.00      Total pack years: 45.00      Types: Cigarettes  Quit date: 12/29/2000      Years since quitting: 21.9   Smokeless tobacco: Never  Vaping Use   Vaping Use: Never used  Substance Use Topics   Alcohol use: Not Currently   Drug use: Not  Currently      Types: Marijuana      Comment: CBD oils through vape shops      Review of symptoms:  Constitutional:  Negative for unexplained weight loss, night sweats, fever, chills ENT:  Negative for nose bleeds, sinus pain, painful swallowing CV:  Negative for chest pain, shortness of breath, exercise intolerance, palpitations, loss of consciousness Resp:  Negative for cough, wheezing, shortness of breath GI:  Negative for nausea, vomiting, diarrhea, bloody stools GU:  Positives noted in HPI; otherwise negative for dysuria, urinary incontinence Neuro:  Negative for seizures, poor balance, limb weakness, slurred speech Psych:  Negative for lack of energy, depression, anxiety Endocrine:  Negative for polydipsia, polyuria, symptoms of hypoglycemia (dizziness, hunger, sweating) Hematologic:  Negative for anemia, purpura, petechia, prolonged or excessive bleeding, use of anticoagulants  Allergic:  Negative for difficulty breathing or choking as a result of exposure to anything; no shellfish allergy; no allergic response (rash/itch) to materials, foods   Physical exam: BP (!) 160/71   Pulse 88   Ht '5\' 3"'$  (1.6 m)   Wt (!) 303 lb (137.4 kg)   LMP 02/03/2012   BMI 53.67 kg/m  GENERAL APPEARANCE:  Well appearing, well developed, well nourished, NAD HEENT: Atraumatic, Normocephalic, oropharynx clear. NECK: Supple without lymphadenopathy or thyromegaly. LUNGS: Clear to auscultation bilaterally. HEART: Regular Rate and Rhythm without murmurs, gallops, or rubs. ABDOMEN: Soft, non-tender, No Masses. EXTREMITIES: Moves all extremities well.  Without clubbing, cyanosis, or edema. NEUROLOGIC:  Alert and oriented x 3, normal gait, CN II-XII grossly intact.  MENTAL STATUS:  Appropriate. BACK:  Non-tender to palpation.  No CVAT SKIN:  Warm, dry and intact.     Results: None

## 2023-01-13 NOTE — Anesthesia Postprocedure Evaluation (Signed)
Anesthesia Post Note  Patient: [name]  Procedure(s) Performed: CYSTOSCOPY WITH RETROGRADE PYELOGRAM, URETEROSCOPY AND STENT EXCHANGE (Right) HOLMIUM LASER APPLICATION (Right)     Patient location during evaluation: PACU Anesthesia Type: General Level of consciousness: awake and alert Pain management: pain level controlled Vital Signs Assessment: post-procedure vital signs reviewed and stable Respiratory status: spontaneous breathing, nonlabored ventilation, respiratory function stable and patient connected to nasal cannula oxygen Cardiovascular status: blood pressure returned to baseline and stable Postop Assessment: no apparent nausea or vomiting Anesthetic complications: no  No notable events documented.  Last Vitals:  Vitals:   01/13/23 1447 01/13/23 1500  BP: (!) 136/59 (!) 142/90  Pulse: 81 79  Resp: (!) 23 20  Temp:  37 C  SpO2: 90% 96%    Last Pain:  Vitals:   01/13/23 1500  TempSrc:   PainSc: 0-No pain   Pain Goal:                   Tanja Gift L Odeal Welden

## 2023-01-14 ENCOUNTER — Other Ambulatory Visit (HOSPITAL_COMMUNITY): Payer: Self-pay | Admitting: Psychiatry

## 2023-01-14 ENCOUNTER — Encounter (HOSPITAL_COMMUNITY): Payer: Self-pay | Admitting: Urology

## 2023-01-14 ENCOUNTER — Telehealth (HOSPITAL_COMMUNITY): Payer: Self-pay | Admitting: Psychiatry

## 2023-01-14 DIAGNOSIS — F4310 Post-traumatic stress disorder, unspecified: Secondary | ICD-10-CM

## 2023-01-14 DIAGNOSIS — F411 Generalized anxiety disorder: Secondary | ICD-10-CM

## 2023-01-14 DIAGNOSIS — F32A Depression, unspecified: Secondary | ICD-10-CM

## 2023-01-14 MED ORDER — TRAZODONE HCL 50 MG PO TABS: 50.0000 mg | ORAL_TABLET | Freq: Every day | ORAL | 3 refills | Status: DC

## 2023-01-14 NOTE — Telephone Encounter (Signed)
Patient informed Probation officer that she was notified by her pharmacy that her insurance would only pay for 15 days of Ambien.  She also informed Probation officer that Ambien needs a prior authorization.  Provider recommended increasing trazodone 50 mg to 100 mg to help manage sleep.  She endorsed understanding and agreed.  Provider did inform nursing staff of prior authorization needed for Ambien.  Patient informed that if trazodone is not effective Ambien will be discontinued.  She endorsed understanding and agreed.  No other concerns noted at this time.

## 2023-01-15 ENCOUNTER — Telehealth (HOSPITAL_COMMUNITY): Payer: Self-pay | Admitting: *Deleted

## 2023-01-15 NOTE — Telephone Encounter (Signed)
Fax received from [facility] for approval of Zolpidem Tartrate for 01/15/2023-07/14/2023.  Josem Kaufmann #[phone].

## 2023-01-19 ENCOUNTER — Other Ambulatory Visit: Payer: Medicaid Other | Admitting: Licensed Clinical Social Worker

## 2023-01-19 ENCOUNTER — Ambulatory Visit (INDEPENDENT_AMBULATORY_CARE_PROVIDER_SITE_OTHER): Payer: Medicaid Other | Admitting: Licensed Clinical Social Worker

## 2023-01-19 DIAGNOSIS — F331 Major depressive disorder, recurrent, moderate: Secondary | ICD-10-CM

## 2023-01-19 DIAGNOSIS — F411 Generalized anxiety disorder: Secondary | ICD-10-CM

## 2023-01-19 DIAGNOSIS — F4310 Post-traumatic stress disorder, unspecified: Secondary | ICD-10-CM | POA: Diagnosis not present

## 2023-01-19 NOTE — Patient Outreach (Signed)
Medicaid Managed Care Social Work Note  01/19/2023 Name:  [name] MRN:  [id number] DOB:  11-26-1961  [name] is an 62 y.o. year old female who is a primary patient of [name], MD.  The [place] team was consulted for assistance with:  Welcome and Resources  [name] was given information about Medicaid Managed Care Coordination team services today. [name] Patient agreed to services and verbal consent obtained.  Engaged with patient  for by telephone forfollow up visit in response to referral for case management and/or care coordination services.   Assessments/Interventions:  Review of past medical history, allergies, medications, health status, including review of consultants reports, laboratory and other test data, was performed as part of comprehensive evaluation and provision of chronic care management services.  SDOH: (Social Determinant of Health) assessments and interventions performed: SDOH Interventions    Flowsheet Row Patient Outreach Telephone from 01/19/2023 in [place] Most recent reading at 01/19/2023 11:26 AM Patient Outreach Telephone from 01/09/2023 in [place] Most recent reading at 01/09/2023  3:56 PM Patient Outreach Telephone from 01/09/2023 in [place] Most recent reading at 01/09/2023 10:40 AM Counselor from 01/02/2023 in [facility] Most recent reading at 01/02/2023 11:19 AM Patient Outreach Telephone from 12/08/2022 in [place] Most recent reading at 12/08/2022 10:22 AM ED to Hosp-Admission (Discharged) from 11/16/2022 in [facility] Most recent reading at 11/16/2022  5:56 PM  SDOH Interventions        Housing Interventions -- -- -- -- -- Intervention Not Indicated  Alcohol Usage Interventions -- -- -- --  Intervention Not Indicated (Score <7) --  Depression Interventions/Treatment  -- -- -- Currently on Treatment -- --  Financial Strain Interventions -- -- -- -- Intervention Not Indicated --  Physical Activity Interventions -- Other (Comments)  [patient not able to engage in this level of exercise] -- -- -- --  Stress Interventions Offered [employer], Provide Counseling -- [employer], Provide Counseling -- -- --  Social Connections Interventions -- Intervention Not Indicated -- -- -- --       Advanced Directives Status:  See Care Plan for related entries.  Care Plan                 No Known Allergies  Medications Reviewed Today     Reviewed by Octavia Bruckner, RN (Registered Nurse) on 01/13/23 at 1126  Med List Status: Complete   Medication Order Taking? Sig Documenting Provider Last Dose Status Informant  Accu-Chek FastClix Lancets MISC [id number] Yes Check blood sugar two times a day Lorella Nimrod, MD Past Week Active Self  acetaminophen (TYLENOL) 500 MG tablet [id number] Yes Take 1,000 mg by mouth every 6 (six) hours as needed for moderate pain. [provider] Past Week Active Self  albuterol (PROVENTIL) (2.5 MG/3ML) 0.083% nebulizer solution [id number] No USE 1 VIAL IN NEBULIZER EVERY 4 HOURS AS NEEDED FOR WHEEZING OR SHORTNESS OF BREATH Amponsah, Charisse March, MD Unknown Active Self  amlodipine-atorvastatin (CADUET) 5-80 MG tablet [id number] Yes Take 1 tablet by mouth daily. Leigh Aurora, DO 01/12/2023 Active Self  ASPIRIN ADULT LOW STRENGTH 81 MG EC tablet [id number] No TAKE 1 Tablet BY MOUTH ONCE DAILY  Patient not taking: Reported on 01/07/2023   Lorella Nimrod, MD Not Taking Active Self  B-D UF III MINI PEN NEEDLES 31G X  5 MM MISC [id number] Yes USE AS DIRECTED DAILY Angelica Pou, MD Past Week Active Self  Blood Glucose Monitoring Suppl (ACCU-CHEK GUIDE) w/Device KIT [id number] Yes 1 each by Does not apply route 2 (two) times  daily. Lorella Nimrod, MD Past Week Active Self  busPIRone (BUSPAR) 10 MG tablet [id number] Yes Take 1 tablet (10 mg total) by mouth 3 (three) times daily.  Patient taking differently: Take 10 mg by mouth 2 (two) times daily.   Salley Slaughter, NP  Active Self  cholecalciferol (VITAMIN D3) 25 MCG (1000 UNIT) tablet [id number] Yes Take 1,000 Units by mouth daily. [provider] Past Week Active Self  citalopram (CELEXA) 20 MG tablet [id number] Yes TAKE 1 TABLET BY MOUTH ONCE DAILY. DO NOT TAKE WHILE TAKING DULOXETINE Salley Slaughter, NP 01/12/2023 Active Self  clopidogrel (PLAVIX) 75 MG tablet [id number] Yes TAKE 1 TABLET BY MOUTH IN THE [name], MD 01/04/2023 Active Self  Continuous Blood Gluc Sensor (FREESTYLE LIBRE 3 SENSOR) MISC [id number] Yes Place 1 sensor on the skin every 14 days. Use to check glucose continuously [name], MD Past Week Active Self  Dulaglutide (TRULICITY) 4.5 TD/1.7OH SOPN [id number] Yes Inject 4.5 mg as directed once a week. Drucie Opitz, MD 01/03/2023 Active Self           Med Note Jilda Roche A   Wed Jan 07, 2023 11:24 AM) Saturdays  Evolocumab (REPATHA SURECLICK) 694 MG/ML [name] [id number] No INJECT 1 PEN INTO THE SKIN EVERY 14 DAYS Burnell Blanks, MD More than a month Active Self  exemestane (AROMASIN) 25 MG tablet [id number] Yes TAKE 1 TABLET BY MOUTH ONCE DAILY AFTER Clint Lipps, MD 01/12/2023 Active Self  ezetimibe (ZETIA) 10 MG tablet [id number] Yes Take 1 tablet (10 mg total) by mouth daily. [name], PA-C  Expired 01/07/23 2359 Self  ferrous sulfate 325 (65 FE) MG tablet [id number] Yes Take 1 tablet (325 mg total) by mouth every other day. [name], MD Past Week Active Self  Fluticasone-Umeclidin-Vilant [facility] ELLIPTA) 100-62.5-25 MCG/ACT AEPB [id number] Yes INHALE 1 PUFF ONCE DAILY Strength: 100-62.5-25 MCG/ACT Lacinda Axon, MD 01/12/2023 Active Self  furosemide (LASIX) 40 MG tablet  [id number] Yes Take 1 tablet (40 mg total) by mouth as needed for fluid or edema. Dorethea Clan, DO Past Week Active Self  gabapentin (NEURONTIN) 300 MG capsule [id number] Yes TAKE 2 CAPSULES BY MOUTH IN THE MORNING AND TAKE 3 CAPSULES AT BEDTIME [name], MD 01/12/2023 Active Self  glucose blood (ACCU-CHEK GUIDE) test strip [id number] Yes Check blood sugar 2 times per day [name], MD Past Week Active Self  hydrocortisone cream 1 % [id number] Yes Apply 1 Application topically daily as needed for itching. [provider] Past Week Active Self  LANTUS SOLOSTAR 100 UNIT/ML Solostar Pen [id number] Yes Inject 30 Units into the skin at bedtime. [provider] 01/12/2023 2100 Active Self  losartan-hydrochlorothiazide (HYZAAR) 100-25 MG tablet [id number] Yes Take 1 tablet by mouth daily. [name], MD 01/12/2023 Active Self  metFORMIN (GLUCOPHAGE-XR) 500 MG 24 hr tablet [id number] Yes Take 1 tablet (500 mg total) by mouth 2 (two) times daily. [name], MD 01/12/2023 Active Self  metoprolol succinate (TOPROL-XL) 50 MG 24 hr tablet [id number] Yes TAKE 1 TABLET BY MOUTH ONCE DAILY WITH MEALS OR  IMMEDIATELY  FOLLOWING  A  MEAL [name], MD 01/12/2023 2100 Active Self  montelukast (SINGULAIR) 10 MG tablet [id number] Yes Take 1 tablet (10 mg total) by mouth  at bedtime. [name], MD Past Week Active Self  nitrofurantoin, macrocrystal-monohydrate, (MACROBID) 100 MG capsule [id number] Yes Take 1 capsule (100 mg total) by mouth daily. Primus Bravo., MD 01/12/2023 Active Self  senna-docusate (SENNA-TIME S) 8.6-50 MG tablet [id number] Yes Take 1 tablet by mouth at bedtime as needed for mild constipation. [name], MD Past Week Active Self  traZODone (DESYREL) 50 MG tablet [id number] Yes TAKE 1/2 TO 1 (ONE-HALF TO ONE) TABLET BY MOUTH AT BEDTIME  Patient taking differently: Take 50 mg by mouth at bedtime.   Salley Slaughter, NP 01/12/2023 Active Self   VENTOLIN HFA 108 (90 Base) MCG/ACT inhaler [id number] Yes INHALE 2 PUFFS BY MOUTH EVERY 6 HOURS AS NEEDED FOR WHEEZING FOR SHORTNESS OF Loyal Gambler, MD 01/12/2023 Active Self  zolpidem (AMBIEN) 5 MG tablet [id number]  Take 1 tablet (5 mg total) by mouth at bedtime as needed for sleep. Salley Slaughter, NP  Active             Patient Active Problem List   Diagnosis Date Noted   History of UTI 12/05/2022   Sepsis due to Escherichia coli with encephalopathy and septic shock ([place]) 11/17/2022   COPD exacerbation ([place]) 11/16/2022   Diabetic neuropathy ([place]) 08/29/2022   Vaginal lesion 08/29/2022   PTSD (post-traumatic stress disorder) 06/24/2022   GAD (generalized anxiety disorder) 06/24/2022   Major depressive disorder, recurrent episode, moderate ([place]) 06/24/2022   Urinary incontinence 06/20/2022   Intertrigo 06/20/2022   Arthritis of left shoulder region    S/P reverse total shoulder arthroplasty, 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   Healthcare maintenance 11/17/2021   Spondylolisthesis, lumbar region 02/11/2021    Class: Acute   Malignant neoplasm of upper-outer quadrant of right breast in female, estrogen receptor positive ([place]) 06/10/2018   Restless leg syndrome 02/04/2018   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   Depression 08/06/2015   Hypertension associated with diabetes ([place])    Aortic stenosis    Chronic diastolic congestive heart failure ([place])    COPD (chronic obstructive pulmonary disease) ([place]) 09/21/2013   Obstructive sleep apnea 09/21/2013   Nephrolithiasis 09/21/2013   Nocturnal hypoxemia 09/12/2013   Hyperlipidemia 11/30/2012   History of pulmonary embolus (PE) 07/09/2012   Normocytic anemia 02/08/2012   Type 2 diabetes mellitus without complication, without long-term current use of insulin ([place]) 02/08/2012   Asthma 02/08/2012   GERD (gastroesophageal reflux disease)    CAD (coronary artery disease) 12/08/2011    Conditions to be  addressed/monitored per PCP order:  Depression  Care Plan : LCSW Plan of Care  Updates made by Greg Cutter, LCSW since 01/19/2023 12:00 AM     Problem: Depression Identification (Depression)      Long-Range Goal: Depressive Symptoms Identified   Start Date: 04/17/2022  Recent Progress: On track  Note:   Long-Range Goal: I want to start mental health treatment    Start Date: 04/17/2022  Priority: High  Note:   Priority: High   Timeframe:  Long-Range Goal Priority:  High Start Date:   04/17/22                Expected End Date:  ongoing                 Follow Up Date--02/09/23 at 245 pm  - check out counseling - keep 90 percent of counseling appointments - schedule counseling appointment    Why is this important?             Beating depression  may take some time.            If you don't feel better right away, don't give up on your treatment plan.     Current barriers:   Chronic Mental Health needs related to depression, stress and anxiety. Patient requires Support, Education, Resources, Referrals, Advocacy, and Care Coordination, in order to meet Unmet Mental Health Needs. Patient will implement clinical interventions discussed today to decrease symptoms of depression and increase knowledge and/or ability of: coping skills. Mental Health Concerns and Social Isolation Patient lacks knowledge of available community counseling agencies and resources.   Clinical Goal(s): verbalize understanding of plan for management of Anxiety, Depression, and Stress and demonstrate a reduction in symptoms. Patient will connect with a provider for ongoing mental health treatment, increase coping skills, healthy habits, self-management skills, and stress reduction        Clinical Interventions:  Assessed patient's previous and current treatment, coping skills, support system and barriers to care. Patient provided history. Patient and spouse lived in a shared home with two other adults. Patient  reports that this is a current and stable living situation.  Verbalization of feelings encouraged, motivational interviewing employed Emotional support provided, positive coping strategies explored Self care/establishing healthy boundaries emphasized Patient reports that she takes medication for anxiety and depression. She reports that this has alleviated some of symptoms but not all. She reports that she still has crying spells. She needs help with finding psychiatry and counseling. She was successful in identifying triggers to anxiety and depression symptoms, in addition, to healthy coping skills.  Patient reports significant worsening anxiety and depression impacting her ability to function appropriately and carry out daily task. Patient receives strong support from spouse but he does not have anxiety or depression and is unable to relate to her and her daily mental health struggles.  Patient is agreeable to referral to [facility] for counseling and psychiatry. [facility] LCSW made referral on 04/17/22. Patient will call [facility] tomorrow on 04/18/22 to schedule counseling and psychiatry appointments. Email sent to her with [facility]'s contact information.  LCSW provided education on relaxation techniques such as meditation, deep breathing, massage, grounding exercises or yoga that can activate the body's relaxation response and ease symptoms of stress and anxiety. LCSW ask that when pt is struggling with difficult emotions and racing thoughts that they start this relaxation response process. LCSW provided extensive education on healthy coping skills for anxiety. SW used active and reflective listening, validated patient's feelings/concerns, and provided emotional support. Patient will work on implementing appropriate self-care habits into their daily routine such as: staying positive, writing a gratitude list, drinking water, staying active around the house, taking their medications as prescribed, combating negative  thoughts or emotions and staying connected with their family and friends. Positive reinforcement provided for this decision to work on this. Patient was receptive to anxiety and depression management coping skill education. She will try to increase her socialization over the next 90 days to decrease isolation.  Patient has problems with sleep disturbance. LCSW provided education on healthy sleep hygiene and what that looks like. LCSW encouraged patient to implement a night time routine into her schedule that works best for her and that she is able to maintain. Advised patient to implement deep breathing/grounding/meditation/self-care exercises into her nightly routine to combat racing thoughts at night. LCSW encouraged patient to wake up at the same time each day, make their sleeping environment comfortable, exercise when able, to limit naps and to not eat or drink anything  right before bed.   Motivational Interviewing employed Depression screen reviewed  PHQ2/ PHQ9 completed Mindfulness or Relaxation training provided Active listening / Reflection utilized  Advance Care and HCPOA education provided Emotional Support Provided Problem [place] strategies reviewed Provided psychoeducation for mental health needs  Provided brief CBT  Reviewed mental health medications and discussed importance of compliance:  Quality of sleep assessed & Sleep Hygiene techniques promoted  Participation in counseling encouraged  Verbalization of feelings encouraged  Suicidal Ideation/Homicidal Ideation assessed: Patient denies SI/HI  Review resources, discussed options and provided patient information about  [place] care team collaboration (see longitudinal plan of care) UPDATE 05/21/22- Patient was successfully set up with counseling at [facility] but was not set up with psychiatry. She is in need of both services. Patient has an upcoming counseling session at [facility] on  06/24/22. [facility] LCSW completed joint call with patient to [facility] but was unable to reach anyone. [facility] LCSW left a voice message asking for them to return call to patient to get her scheduled with psychiatry as [facility] LCSW ask for both psychiatry and counseling in referral. [facility] LCSW ask if they needed an additional referral to please let [facility] LCSW know so this can be completed. [facility] LCSW sent patient an email with [facility]'s contact information including their address as well as a list of coping skills for depression and anxiety. Patient's spouse has multiple health conditions and tells patient that he "wishes to die" often which concerns her. [facility] LCSW provided emotional support and brief coping skill education on stress management.  UPDATE 06/12/22- Patient successfully contacted [facility] and set up appointments for both psychiatry and counseling. Patient is eager to start treatment. Brief self-care education provided to patient. Patient is agreeable to contact [facility] LCSW directly if needed. [facility] LCSW will make one last follow up on call to ensure that patient was successfully connected to a long term mental health provider. Patient denies any current crises or concerns.  UPDATE 07/18/22- Patient has not been set up with counseling at [facility] and she has completed her first counseling session but she has not been set up with psychiatry still. She has made several calls to agency but has been unsuccessful in reaching them. Patient has an in person appointment at [facility] on 07/30/22 and she will ask them to enroll her in psychiatry at that time. [facility] LCSW will follow up and continue to support patient as needed. UPDATE 08/01/22- Patient reports that she is doing extremely well and that her mood has lifted over the last few weeks. She reports that her first counselor appointment well very well and she was able to build rapport with him. She will see her counselor 2X per month. Her next appointment for therapy is on 08/20/22. Patient has her  initial appointment with psychiatry is on the 08/12/22. UPDATE- Patient successfully completed her psychiatry appointment and has a follow up scheduled in two months. However, patient reports having ongoing issues with sleep even with the medication and reports needing this medication to be adjusted. Pt will discuss this concern with her therapist on 09/09/22 but was encouraged to call [facility] before then if needed to address this issue. [facility] LCSW will follow up in two weeks. UPDATE- Patient expressed to her counselor about her ongoing issues with her sleep and he informed her to tell her psychiatrist. [facility] LCSW sent message to her psychiatrist with this medication dosage adjustment request. [facility] LCSW educated patient on healthy sleep hygiene. Patient denies any  current crises. Patient is agreeable to contact [facility] herself to inquire about her ongoing sleep issues. UPDATE 10/17- Patient continues to struggle with ongoing sleep issues and has decided not to call her psychiatrist but wait until her scheduled appointment on 11/06/22 to address these concerns. [facility] LCSW suggested against this as patient will not been seen in over 4 weeks but patient reports that she can manage until then. Healthy sleep and hygiene techniques were provided to patient. Patient will continue to try as much healthy self-care into her daily routine to combat these negative symptoms. [facility] LCSW will follow up the day after her psychiatry appointment. UPDATE- Patient met with counselor and psychiatrist yesterday. She reports that there was an adjustment in her medicine in order to improve her sleep. Patient reports that her sleep has somewhat improved. She reports that she was able to sign up successfully for Social Security. 01/09/23- Patient contacted [facility] Management department and left a message requesting a return call from [facility] LCSW. [facility] LCSW completed call and was informed that patient had called [facility] yesterday and was on hold for a hour. She states  that she is really needing to get a new medicine for sleep. The nurses in the back said that she would need to arrange a telephone call for Dr. Alean Rinne to reach out to assist with this bc she hasn't seen Dr. Alean Rinne in 3 months and will not until next month. Patient as left on hold and eventually hung up. In basket message was sent to staff and psychiatrist at [facility] and these messages were read and hopefully appointment will be scheduled. 01/19/23 update- Pt spoke with her psychiatrist and was able to adjust her medications to see if it could improve her sleep routine. Patient reports that she had a very emotional session with her counselor today. Patient continues to make great strides towards improving her self-worth. Positive reinforcement provided,  Patient Goals/Self-Care Activities: Over the next 120 days Attend scheduled medical appointments Utilize healthy coping skills and supportive resources discussed Contact PCP with any questions or concerns Keep 90 percent of counseling appointments Call your insurance provider for more information about your Enhanced Benefits  Check out counseling resources provided  Begin personal counseling with LCSW, to reduce and manage symptoms of Depression and Stress, until well-established with mental health provider Accept all calls from representative with [facility] in an effort to establish ongoing mental health counseling and supportive services. Incorporate into daily practice - relaxation techniques, deep breathing exercises, and mindfulness meditation strategies. Talk about feelings with friends, family members, spiritual advisor, etc. Contact LCSW directly [phone]), if you have questions, need assistance, or if additional social work needs are identified between now and our next scheduled telephone outreach call. Call 988 for mental health hotline/crisis line if needed (24/7 available) Try techniques to reduce symptoms of anxiety/negative thinking  (deep breathing, distraction, positive self talk, etc)  - develop a personal safety plan - develop a plan to deal with triggers like holidays, anniversaries - exercise at least 2 to 3 times per week - have a plan for how to handle bad days - journal feelings and what helps to feel better or worse - spend time or talk with others at least 2 to 3 times per week - watch for early signs of feeling worse - begin personal counseling - call and visit an old friend - check out volunteer opportunities - join a support group - laugh; watch a funny movie or comedian - learn and use visualization  or guided imagery - perform a random act of kindness - practice relaxation or meditation daily - start or continue a personal journal - practice positive thinking and self-talk -continue with compliance of taking medication        05/21/2022   12:53 PM 04/17/2022    3:17 PM 11/14/2021    4:02 PM 08/21/2021    4:33 PM 12/10/2020    4:00 PM  Depression screen PHQ 2/9  Decreased Interest '2 2 1 '$ 0 1  Down, Depressed, Hopeless '2 2 1 '$ 0 0  PHQ - 2 Score '4 4 2 '$ 0 1  Altered sleeping '3 3 3   1  '$ Tired, decreased energy '3 3 1   1  '$ Change in appetite '3 3 1   1  '$ Feeling bad or failure about yourself  0 1 0   0  Trouble concentrating 0 0 0   0  Moving slowly or fidgety/restless 0   0   0  Suicidal thoughts 0 0 0   0  PHQ-9 Score '13 14 7   4  '$ Difficult doing work/chores Somewhat difficult Somewhat difficult Not difficult at all   Not difficult at all        24- Hour Availability:    [facility]  [address] [place], [place] [facility] [phone]   [facility] [employer] [phone]   [place]  [phone]    [place]  [phone] (after hours)   Therapeutic Alternative/Mobile Crisis   [phone]   [place] National Suicide Hotline  [phone] (TALK) OR 988   Call 911 or go to emergency  room   [facility]  [phone]);  [place] and [employer]  [phone]); [place], [place], [place], [place], [place], [place], [place]           Follow up:  Patient agrees to Care Plan and Follow-up.  Plan: The Managed Medicaid care management team will reach out to the patient again over the next 30 days.  Date/time of next scheduled Social Work care management/care coordination outreach:  02/09/23 at 245 pm.  [name], [place], MSW, [place] Medicaid [email or web address] [email or web address] Phone: [phone]
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
## 2023-01-19 NOTE — Patient Instructions (Signed)
Visit Information  [name] was given information about Medicaid Managed Care team care coordination services as a part of their [facility] Medicaid benefit. [name] verbally consented to engagement with the [facility] Managed Care team.   If you are experiencing a medical emergency, please call 911 or report to your local emergency department or urgent care.   If you have a non-emergency medical problem during routine business hours, please contact your provider's office and ask to speak with a nurse.   For questions related to your [facility] health plan, please call: [phone] or go here:[email or web address]  If you would like to schedule transportation through your [facility] plan, please call the following number at least 2 days in advance of your appointment: [phone].  You can also use the MTM portal or MTM mobile app to manage your rides. For the portal, please go to [email or web address].  Call the [place] at [phone], at any time, 24 hours a day, 7 days a week. If you are in danger or need immediate medical attention call 911.  If you would like help to quit smoking, call 1-800-QUIT-NOW 365-126-2269) OR Espaol: 1-855-Djelo-Ya (6-546-503-5465) o para ms informacin haga clic aqu or Text READY to 200-400 to register via text   Following is a copy of your plan of care:  Care Plan : LCSW Plan of Care  Updates made by Greg Cutter, LCSW since 01/19/2023 12:00 AM     Problem: Depression Identification (Depression)      Long-Range Goal: Depressive Symptoms Identified   Start Date: 04/17/2022  Recent Progress: On track  Note:   Long-Range Goal: I want to start mental health treatment    Start Date: 04/17/2022  Priority: High  Note:   Priority: High   Timeframe:  Long-Range Goal Priority:  High Start Date:   04/17/22                Expected End Date:  ongoing                 Follow Up Date--02/09/23 at 245  pm  - check out counseling - keep 90 percent of counseling appointments - schedule counseling appointment    Why is this important?             Beating depression may take some time.            If you don't feel better right away, don't give up on your treatment plan.     Current barriers:   Chronic Mental Health needs related to depression, stress and anxiety. Patient requires Support, Education, Resources, Referrals, Advocacy, and Care Coordination, in order to meet Unmet Mental Health Needs. Patient will implement clinical interventions discussed today to decrease symptoms of depression and increase knowledge and/or ability of: coping skills. Mental Health Concerns and Social Isolation Patient lacks knowledge of available community counseling agencies and resources.   Clinical Goal(s): verbalize understanding of plan for management of Anxiety, Depression, and Stress and demonstrate a reduction in symptoms. Patient will connect with a provider for ongoing mental health treatment, increase coping skills, healthy habits, self-management skills, and stress reduction        Patient Goals/Self-Care Activities: Over the next 120 days Attend scheduled medical appointments Utilize healthy coping skills and supportive resources discussed Contact PCP with any questions or concerns Keep 90 percent of counseling appointments Call your insurance provider for more information about your Enhanced Benefits  Check out counseling resources provided  Begin personal  counseling with LCSW, to reduce and manage symptoms of Depression and Stress, until well-established with mental health provider Accept all calls from representative with [facility] in an effort to establish ongoing mental health counseling and supportive services. Incorporate into daily practice - relaxation techniques, deep breathing exercises, and mindfulness meditation strategies. Talk about feelings with friends, family members, spiritual  advisor, etc. Contact LCSW directly [phone]), if you have questions, need assistance, or if additional social work needs are identified between now and our next scheduled telephone outreach call. Call 988 for mental health hotline/crisis line if needed (24/7 available) Try techniques to reduce symptoms of anxiety/negative thinking (deep breathing, distraction, positive self talk, etc)  - develop a personal safety plan - develop a plan to deal with triggers like holidays, anniversaries - exercise at least 2 to 3 times per week - have a plan for how to handle bad days - journal feelings and what helps to feel better or worse - spend time or talk with others at least 2 to 3 times per week - watch for early signs of feeling worse - begin personal counseling - call and visit an old friend - check out volunteer opportunities - join a support group - laugh; watch a funny movie or comedian - learn and use visualization or guided imagery - perform a random act of kindness - practice relaxation or meditation daily - start or continue a personal journal - practice positive thinking and self-talk -continue with compliance of taking medication        05/21/2022   12:53 PM 04/17/2022    3:17 PM 11/14/2021    4:02 PM 08/21/2021    4:33 PM 12/10/2020    4:00 PM  Depression screen PHQ 2/9  Decreased Interest '2 2 1 '$ 0 1  Down, Depressed, Hopeless '2 2 1 '$ 0 0  PHQ - 2 Score '4 4 2 '$ 0 1  Altered sleeping '3 3 3   1  '$ Tired, decreased energy '3 3 1   1  '$ Change in appetite '3 3 1   1  '$ Feeling bad or failure about yourself  0 1 0   0  Trouble concentrating 0 0 0   0  Moving slowly or fidgety/restless 0   0   0  Suicidal thoughts 0 0 0   0  PHQ-9 Score '13 14 7   4  '$ Difficult doing work/chores Somewhat difficult Somewhat difficult Not difficult at all   Not difficult at all        24- Hour Availability:    [facility]  [address] [place], [place]  [place] Crisis [phone]   [facility] of the [employer] [phone]   [place]  [phone]    [place]  [phone] (after hours)   Therapeutic Alternative/Mobile Crisis   [phone]   [place] National Suicide Hotline  724-117-8500 (TALK) OR 988   Call 911 or go to emergency room   [facility]  [phone]);  [place] and [employer]  [phone]); [place], [place], [place], [place], [place], [place], [place]

## 2023-01-20 ENCOUNTER — Encounter: Payer: Self-pay | Admitting: Urology

## 2023-01-20 ENCOUNTER — Ambulatory Visit (INDEPENDENT_AMBULATORY_CARE_PROVIDER_SITE_OTHER): Payer: Medicaid Other | Admitting: Urology

## 2023-01-20 VITALS — BP 143/84 | HR 90 | Ht 63.0 in | Wt 305.0 lb

## 2023-01-20 DIAGNOSIS — N200 Calculus of kidney: Secondary | ICD-10-CM

## 2023-01-20 DIAGNOSIS — Z87440 Personal history of urinary (tract) infections: Secondary | ICD-10-CM

## 2023-01-20 LAB — URINALYSIS
Bilirubin, UA: NEGATIVE
Glucose, UA: NEGATIVE mg/dL
Ketones, POC UA: NEGATIVE mg/dL
Nitrite, UA: NEGATIVE
Protein Ur, POC: NEGATIVE mg/dL
Spec Grav, UA: 1.02 (ref 1.010–1.025)
Urobilinogen, UA: 1 E.U./dL
pH, UA: 6.5 (ref 5.0–8.0)

## 2023-01-20 NOTE — Progress Notes (Signed)
Assessment: 1. Nephrolithiasis   2. History of UTI     Plan: Stent removed by patient Stone prevention discussed and information provided Continue Macrobid until current supply is gone Return to office in 1 month  Chief Complaint:  Chief Complaint  Patient presents with   Nephrolithiasis    History of Present Illness:  [name] is a 62 y.o. female who is seen for continued evaluation of right proximal ureteral stone and UTI.  She was seen by Dr. Cain Sieve at [facility] on 11/16/2022 with a proximal right ureteral calculus and UTI with sepsis.  CT imaging from 11/16/2022 showed an obstructing 4 mm calculus at the right UPJ with associated hydronephrosis and punctate nonobstructing left nephrolithiasis.  She underwent cystoscopy with placement of a right ureteral stent on 11/16/2022.  Urine culture grew >100 K E. coli.  Blood cultures were also positive for E. coli.  She was treated with IV antibiotics and discharged on [place]. Urine culture from 12/05/22 grew >100K E. Coli.  Treated with Cefdinir and started on daily Macrobid. She underwent right ureteroscopic laser lithotripsy and exchange of a right ureteral stent on 01/13/2023.  The stone was completely fragmented with several larger fragments removed with a stone basket.  She returns today for follow-up.  Her stent was removed accidentally while using the bathroom several days ago no.  No flank pain following stent removal.  She continues on daily Macrobid.  No dysuria or gross hematuria.  Portions of the above documentation were copied from a prior visit for review purposes only.   Past Medical History:  Past Medical History:  Diagnosis Date   Anemia    Anxiety    Aortic valve stenosis, severe    Arthritis    PAIN AND SEVERE OA LEFT KNEE ; S/P RIGHT TKA ON 02/03/12; HAS LOWER BACK PAIN-UNABLE TO STAND MORE THAN 10 MIN; ARTHRITIS "ALL OVER"   Asthma    Blood transfusion    [facility]   Breast cancer in female [facility])     Right   CAD (coronary artery disease)    Cath 2010 with DES x 1 RCA-- PT'S CARDIOLOGIST IS DR. MCALHANY   Chronic diastolic congestive heart failure (HCC)    COPD (chronic obstructive pulmonary disease) (HCC)    Depression    Diabetes mellitus DIAGNOSED IN2010   Dyspnea    with much ambulation   Eczema    on back   Headache    migraines younger- rare now 02/07/21   Heart murmur    no current problems   History of hiatal hernia    History of kidney stones    passed or blasted   Hyperlipidemia    Hypertension    Morbid obesity with body mass index of 60.0-69.9 in adult [facility])    Myocardial infarction ([place])    PT THINKS SHE WAS DX WITH MI AT THE TIME OF HEART STENTING   Neuromuscular disorder (HCC)    bilateral arm/hands   Oxygen dependent    uses 3L oxygen night/prn   Personal history of radiation therapy    Pneumonia    Pulmonary embolism ([place]) 02/08/2012   S/P RT TOTAL KNEE ON 02/03/12--ON 02/08/12--DEVELOPED ACUTE SOB AND CHEST PAIN--AND DIAGNOSED WITH  PULMONARY EMBOLUS AND PNEUMONIA   Restless leg syndrome    Sleep apnea    uses 3 liters O2 at night as needed   Uterine fibroid    NO PROBLEMS AT PRESENT FROM THE FIBROIDS-STATES SHE IS POST MENOPAUSAL-LAST MENSES  2010 EXCEPT FOR EPISODE THIS YR OF BLEEDING RELATED TO FIBROIDS.   Weakness    BOTH HANDS - S/P BILATERAL CARPAL TUNNEL RELEASE--BUT STILL HAS WEAKNESS--OFTEN DROPS THINGS    Past Surgical History:  Past Surgical History:  Procedure Laterality Date   BACK SURGERY  02/11/2021   Dr Louanne Skye   BREAST BIOPSY Right 06/04/2018   BREAST LUMPECTOMY Right 06/2018   BREAST LUMPECTOMY WITH RADIOACTIVE SEED AND SENTINEL LYMPH NODE BIOPSY Right 07/19/2018   Procedure: BREAST LUMPECTOMY WITH RADIOACTIVE SEED AND SENTINEL LYMPH NODE BIOPSY;  Surgeon: Alphonsa Overall, MD;  Location: [place];  Service: General;  Laterality: Right;   CARDIAC CATHETERIZATION     CARDIAC VALVE REPLACEMENT     2017   CARPAL TUNNEL RELEASE      Bilateral   CHOLECYSTECTOMY     COLONOSCOPY     CORONARY ANGIOPLASTY     2010 has stent in place   CYSTOSCOPY W/ RETROGRADES Right 09/21/2013   Procedure: CYSTOSCOPY WITH RIGHT RETROGRADE PYELOGRAM RIGHT DOUBLE J STENT ;  Surgeon: Fredricka Bonine, MD;  [facility];  Service: Urology;  Laterality: Right;   CYSTOSCOPY W/ URETERAL STENT PLACEMENT Right 11/16/2022   Procedure: CYSTOSCOPY WITH RETROGRADE PYELOGRAM/URETERAL STENT PLACEMENT;  Surgeon: Vira Agar, MD;  Location: [place];  Service: Urology;  Laterality: Right;   CYSTOSCOPY WITH RETROGRADE PYELOGRAM, URETEROSCOPY AND STENT PLACEMENT Right 01/13/2023   Procedure: CYSTOSCOPY WITH RETROGRADE PYELOGRAM, URETEROSCOPY AND STENT EXCHANGE;  Surgeon: Primus Bravo., MD;  [facility];  Service: Urology;  Laterality: Right;   CYSTOSCOPY WITH URETEROSCOPY AND STENT PLACEMENT Right 10/25/2013   Procedure: CYSTOSCOPY RIGHT URETEROSCOPY HOLMIUM LASER LITHO AND STENT PLACEMENT;  Surgeon: Fredricka Bonine, MD;  [facility];  Service: Urology;  Laterality: Right;   HERNIA REPAIR  [id number]   umbilical ,   HOLMIUM LASER APPLICATION Right 09/21/2682   Procedure: HOLMIUM LASER APPLICATION;  Surgeon: Primus Bravo., MD;  [facility];  Service: Urology;  Laterality: Right;   INTRAOPERATIVE TRANSESOPHAGEAL ECHOCARDIOGRAM N/A 12/12/2014   Procedure: INTRAOPERATIVE TRANSESOPHAGEAL ECHOCARDIOGRAM;  Surgeon: Burnell Blanks, MD;  Location: [place];  Service: Cardiovascular;  Laterality: N/A;   JOINT REPLACEMENT     bil total knees   KNEE ARTHROPLASTY  02/03/2012   Procedure: COMPUTER ASSISTED TOTAL KNEE ARTHROPLASTY;  Surgeon: Mcarthur Rossetti, MD;  Location: [place];  Service: Orthopedics;  Laterality: Right;  Right total knee arthroplasty   LEFT AND RIGHT HEART CATHETERIZATION WITH CORONARY ANGIOGRAM N/A 03/17/2013   Procedure: LEFT AND RIGHT HEART CATHETERIZATION WITH CORONARY ANGIOGRAM;  Surgeon:  Burnell Blanks, MD;  [facility] C[facility] LAB;  Service: Cardiovascular;  Laterality: N/A;   LEFT AND RIGHT HEART CATHETERIZATION WITH CORONARY/GRAFT ANGIOGRAM N/A 09/14/2014   Procedure: LEFT AND RIGHT HEART CATHETERIZATION WITH Beatrix Fetters;  Surgeon: Burnell Blanks, MD;  Location: [facility] CATH LAB;  Service: Cardiovascular;  Laterality: N/A;   REVERSE SHOULDER ARTHROPLASTY Left 03/06/2022   Procedure: LEFT SHOULDER REPLACEMENT APPLICATION OF WOUND VAC;  Surgeon: [name], MD;  Location: [place];  Service: Orthopedics;  Laterality: Left;   TEE WITHOUT CARDIOVERSION N/A 03/14/2013   Procedure: TRANSESOPHAGEAL ECHOCARDIOGRAM (TEE);  Surgeon: Lelon Perla, MD;  Location: [facility] ENDOSCOPY;  Service: Cardiovascular;  Laterality: N/A;   TEE WITHOUT CARDIOVERSION N/A 11/14/2014   Procedure: TRANSESOPHAGEAL ECHOCARDIOGRAM (TEE);  Surgeon: Lelon Perla, MD;  Location: [facility] ENDOSCOPY;  Service: Cardiovascular;  Laterality: N/A;   TONSILLECTOMY     maybe as  a child- does not know   TOTAL KNEE ARTHROPLASTY  09/10/2012   Procedure: TOTAL KNEE ARTHROPLASTY;  Surgeon: Mcarthur Rossetti, MD;  [facility];  Service: Orthopedics;  Laterality: Left;   TOTAL KNEE REVISION Right 07/15/2013   Procedure: REVISION ARTHROPLASTY RIGHT KNEE;  Surgeon: Mcarthur Rossetti, MD;  [facility];  Service: Orthopedics;  Laterality: Right;   TRANSCATHETER AORTIC VALVE REPLACEMENT, TRANSFEMORAL N/A 12/12/2014   Procedure: TRANSCATHETER AORTIC VALVE REPLACEMENT, TRANSFEMORAL;  Surgeon: Burnell Blanks, MD;  Location: [place];  Service: Cardiovascular;  Laterality: N/A;   TRIGGER FINGER RELEASE  09/10/2012   Procedure: RELEASE TRIGGER FINGER/A-1 PULLEY;  Surgeon: Mcarthur Rossetti, MD;  [facility];  Service: Orthopedics;  Laterality: Right;  Right Ring Finger   TUBAL LIGATION      Allergies:  No Known Allergies  Family History:  Family History  Problem Relation  Age of Onset   Breast cancer Mother        stage IV at diagnosis   Emphysema Mother        smoked   Heart disease Mother    COPD Father        smoked   Asthma Father    Heart disease Father    Cancer Brother        Sinus    Social History:  Social History   Tobacco Use   Smoking status: Former    Packs/day: 1.50    Years: 30.00    Total pack years: 45.00    Types: Cigarettes    Quit date: 12/29/2000    Years since quitting: 22.0   Smokeless tobacco: Never  Vaping Use   Vaping Use: Never used  Substance Use Topics   Alcohol use: Not Currently   Drug use: Not Currently    Types: Marijuana    Comment: CBD oils through vape shops    ROS: Constitutional:  Negative for fever, chills, weight loss CV: Negative for chest pain, previous MI, hypertension Respiratory:  Negative for shortness of breath, wheezing, sleep apnea, frequent cough GI:  Negative for nausea, vomiting, bloody stool, GERD  Physical exam: BP (!) 143/84   Pulse 90   Ht '5\' 3"'$  (1.6 m)   Wt (!) 305 lb (138.3 kg)   LMP 02/03/2012   BMI 54.03 kg/m  GENERAL APPEARANCE:  Well appearing, well developed, well nourished, NAD HEENT:  Atraumatic, normocephalic, oropharynx clear NECK:  Supple without lymphadenopathy or thyromegaly ABDOMEN:  Soft, non-tender, no masses EXTREMITIES:  Moves all extremities well, without clubbing, cyanosis, or edema NEUROLOGIC:  Alert and oriented x 3, normal gait, CN II-XII grossly intact MENTAL STATUS:  appropriate BACK:  Non-tender to palpation, No CVAT SKIN:  Warm, dry, and intact  Results: U/A dipstick: trace blood, trace LE

## 2023-01-23 ENCOUNTER — Other Ambulatory Visit (HOSPITAL_COMMUNITY): Payer: Self-pay

## 2023-01-26 ENCOUNTER — Ambulatory Visit (HOSPITAL_COMMUNITY): Payer: Medicaid Other

## 2023-01-28 ENCOUNTER — Ambulatory Visit (INDEPENDENT_AMBULATORY_CARE_PROVIDER_SITE_OTHER): Payer: Medicaid Other | Admitting: Student

## 2023-01-28 DIAGNOSIS — E1142 Type 2 diabetes mellitus with diabetic polyneuropathy: Secondary | ICD-10-CM

## 2023-01-28 DIAGNOSIS — F3342 Major depressive disorder, recurrent, in full remission: Secondary | ICD-10-CM

## 2023-01-28 MED ORDER — BUPROPION HCL ER (XL) 150 MG PO TB24: 150.0000 mg | ORAL_TABLET | Freq: Every day | ORAL | 0 refills | Status: DC

## 2023-01-28 MED ORDER — GABAPENTIN 300 MG PO CAPS: ORAL_CAPSULE | ORAL | 0 refills | Status: DC

## 2023-01-29 DIAGNOSIS — Z419 Encounter for procedure for purposes other than remedying health state, unspecified: Secondary | ICD-10-CM | POA: Diagnosis not present

## 2023-01-29 NOTE — Progress Notes (Signed)
[facility] Telephone Encounter Continuity Care Appointment  HPI:  This telephone encounter was created for [name] on 01/29/2023 for the following purpose/cc fatigue.   Past Medical History:  Past Medical History:  Diagnosis Date   Anemia    Anxiety    Aortic valve stenosis, severe    Arthritis    PAIN AND SEVERE OA LEFT KNEE ; S/P RIGHT TKA ON 02/03/12; HAS LOWER BACK PAIN-UNABLE TO STAND MORE THAN 10 MIN; ARTHRITIS "ALL OVER"   Asthma    Blood transfusion    2013[facility]   Breast cancer in female [facility])    Right   CAD (coronary artery disease)    Cath 2010 with DES x 1 RCA-- PT'S CARDIOLOGIST IS DR. MCALHANY   Chronic diastolic congestive heart failure (HCC)    COPD (chronic obstructive pulmonary disease) (HCC)    Depression    Diabetes mellitus DIAGNOSED IN2010   Dyspnea    with much ambulation   Eczema    on back   Headache    migraines younger- rare now 02/07/21   Heart murmur    no current problems   History of hiatal hernia    History of kidney stones    passed or blasted   Hyperlipidemia    Hypertension    Morbid obesity with body mass index of 60.0-69.9 in adult [facility])    Myocardial infarction ([place])    PT THINKS SHE WAS DX WITH MI AT THE TIME OF HEART STENTING   Neuromuscular disorder (HCC)    bilateral arm/hands   Oxygen dependent    uses 3L oxygen night/prn   Personal history of radiation therapy    Pneumonia    Pulmonary embolism ([place]) 02/08/2012   S/P RT TOTAL KNEE ON 02/03/12--ON 02/08/12--DEVELOPED ACUTE SOB AND CHEST PAIN--AND DIAGNOSED WITH  PULMONARY EMBOLUS AND PNEUMONIA   Restless leg syndrome    Sleep apnea    uses 3 liters O2 at night as needed   Uterine fibroid    NO PROBLEMS AT PRESENT FROM THE FIBROIDS-STATES SHE IS POST MENOPAUSAL-LAST MENSES 2010 EXCEPT FOR EPISODE THIS YR OF BLEEDING RELATED TO FIBROIDS.   Weakness    BOTH HANDS - S/P BILATERAL CARPAL TUNNEL RELEASE--BUT STILL HAS WEAKNESS--OFTEN  DROPS THINGS     ROS:  As per HPI   Assessment / Plan / Recommendations:  Please see A&P under problem oriented charting for assessment of the patient's acute and chronic medical conditions.  As always, pt is advised that if symptoms worsen or new symptoms arise, they should go to an urgent care facility or to to ER for further evaluation.   Consent and Medical Decision Making:  Patient discussed with Dr. Jimmye Norman This is a telephone encounter between [name] and [name] on 01/29/2023 for fatigue. The visit was conducted with the patient located at home and [name] at [facility]. The patient's identity was confirmed using their DOB and current address. The patient has consented to being evaluated through a telephone encounter and understands the associated risks (an examination cannot be done and the patient may need to come in for an appointment) / benefits (allows the patient to remain at home, decreasing exposure to coronavirus). I personally spent 15 minutes on medical discussion.    Depression Patient is calling in today to discuss recent fatigue over the last two weeks. Describes feeling as though she has no energy throughout the day and has had decrease in appetite.  Notes her sleep as been well-managed and she has been getting adequate sleep. Overall feels "down" but not necessarily sad. She also describes difficulty concentrating on tasks, little interest in things she enjoys, and low appetite. She denies any big life events, triggering events, fevers, chills, dyspnea, chest pain, abdominal pain, nausea, vomiting, dysuria, diarrhea, hematochezia, skin or hair changes.Marland Kitchen PHQ-9 today is 12, was previously 0 while on Celexa. She has been taking her Celexa as prescribed.    Patient's fatigue and low energy seem most consistent with major depressive episode. She is not showing any signs of infection, anemia, or endocrinopathy. She is on quite a few medications, however no  recent changes in any of these. Given it is winter, I suspect she could also have a component of seasonal affective disorder. Patient has an appointment with psychiatry next week. We will add on Wellbutrin to help boost energy and allow her psychiatrist to discuss increasing her SSRI with her. I also discussed getting a lamp for light therapy as well to help.   - Start bupropion '150mg'$  daily - Continue Celexa '20mg'$  daily - Light therapy for SAD - Follow-up with psychiatry next week  [name], MD Internal Medicine PGY-3 Pager: [phone]

## 2023-01-29 NOTE — Assessment & Plan Note (Signed)
Patient is calling in today to discuss recent fatigue over the last two weeks. Describes feeling as though she has no energy throughout the day and has had decrease in appetite. Notes her sleep as been well-managed and she has been getting adequate sleep. Overall feels "down" but not necessarily sad. She also describes difficulty concentrating on tasks, little interest in things she enjoys, and low appetite. She denies any big life events, triggering events, fevers, chills, dyspnea, chest pain, abdominal pain, nausea, vomiting, dysuria, diarrhea, hematochezia, skin or hair changes.Marland Kitchen PHQ-9 today is 12, was previously 0 while on Celexa. She has been taking her Celexa as prescribed.    Patient's fatigue and low energy seem most consistent with major depressive episode. She is not showing any signs of infection, anemia, or endocrinopathy. She is on quite a few medications, however no recent changes in any of these. Given it is winter, I suspect she could also have a component of seasonal affective disorder. Patient has an appointment with psychiatry next week. We will add on Wellbutrin to help boost energy and allow her psychiatrist to discuss increasing her SSRI with her. I also discussed getting a lamp for light therapy as well to help.   - Start bupropion '150mg'$  daily - Continue Celexa '20mg'$  daily - Light therapy for SAD - Follow-up with psychiatry next week

## 2023-02-04 ENCOUNTER — Encounter (HOSPITAL_COMMUNITY): Payer: Self-pay | Admitting: Psychiatry

## 2023-02-04 ENCOUNTER — Telehealth (INDEPENDENT_AMBULATORY_CARE_PROVIDER_SITE_OTHER): Payer: Medicaid Other | Admitting: Psychiatry

## 2023-02-04 DIAGNOSIS — F4310 Post-traumatic stress disorder, unspecified: Secondary | ICD-10-CM | POA: Diagnosis not present

## 2023-02-04 DIAGNOSIS — F411 Generalized anxiety disorder: Secondary | ICD-10-CM | POA: Diagnosis not present

## 2023-02-04 DIAGNOSIS — F32A Depression, unspecified: Secondary | ICD-10-CM

## 2023-02-04 MED ORDER — BUPROPION HCL ER (XL) 150 MG PO TB24: 150.0000 mg | ORAL_TABLET | Freq: Every day | ORAL | 3 refills | Status: DC

## 2023-02-04 MED ORDER — TRAZODONE HCL 50 MG PO TABS: 50.0000 mg | ORAL_TABLET | Freq: Every day | ORAL | 3 refills | Status: DC

## 2023-02-04 MED ORDER — BUSPIRONE HCL 15 MG PO TABS: 15.0000 mg | ORAL_TABLET | Freq: Two times a day (BID) | ORAL | 3 refills | Status: DC

## 2023-02-04 NOTE — Progress Notes (Signed)
BH MD/PA/NP OP Progress Note Virtual Visit via Video Note  I connected with [name] on 02/04/23 at 11:00 AM EST by a video enabled telemedicine application and verified that I am speaking with the correct person using two identifiers.  Location: Patient: Home Provider: Clinic   I discussed the limitations of evaluation and management by telemedicine and the availability of in person appointments. The patient expressed understanding and agreed to proceed.  I provided 30 minutes of non-face-to-face time during this encounter.   02/04/2023 11:06 AM [name]  MRN:  [id number]  Chief Complaint: "I was placed on Wellbutrin"  HPI:  62 year old female seen today for follow-up psychiatric evaluation.  She has a psychiatric history of anxiety, depression, and PTSD.  Currently she is being managed on BuSpar 10 mg 3 times daily (noting that she takes it twice daily), trazodone 50 to 100 mg nightly as needed, and Celexa 20 mg daily.  She informed Probation officer that recently she was started on Wellbutrin 150 mg by her PCP.  She notes the medications are effective in managing her psychiatric condition.   Today she was well-groomed, pleasant, cooperative, and engaged in conversation.  She informed Probation officer that since her last visit her anxiety and depression has improved.  She notes that she was started on Wellbutrin by her PCP because of increased fatigue and increased depression.  Patient notes that she now has more energy.  Today provider conducted GAD-7 and patient scored a 6, at her last visit she scored a 14.  Provider also conducted PHQ-9 the patient scored a 6, at her last visit she scored a 9.  She endorses adequate sleep and appetite.  Today she denies SI/HI/AVH, mania, paranoia.    Patient informed writer that she continues to have back and leg pains.  She notes that her leg feels heavy.  She notes that Flexeril and gabapentin are somewhat effective in managing her pain.     Provider  discussed the risk of being on 3 antidepressants and notes that it can cause serotonin syndrome.  She endorsed understanding and agreed.  Provider recommended discontinuing Celexa and increasing BuSpar.  She was agreeable to this.  Today BuSpar increased from 10 mg twice daily to 15 mg twice daily.  She will continue all other medications as prescribed and follow-up with outpatient counseling for therapy.  Patient requested Probation officer to fill Wellbutrin.  Writer was agreeable to this.  No other concerns noted at this time.     Visit Diagnosis:    ICD-10-CM   1. GAD (generalized anxiety disorder)  F41.1 busPIRone (BUSPAR) 15 MG tablet    traZODone (DESYREL) 50 MG tablet    2. PTSD (post-traumatic stress disorder)  F43.10 busPIRone (BUSPAR) 15 MG tablet    traZODone (DESYREL) 50 MG tablet    buPROPion (WELLBUTRIN XL) 150 MG 24 hr tablet    3. Mild depression  F32.A busPIRone (BUSPAR) 15 MG tablet    traZODone (DESYREL) 50 MG tablet    buPROPion (WELLBUTRIN XL) 150 MG 24 hr tablet      Past Psychiatric History: anxiety, depression, and PTSD  Past Medical History:  Past Medical History:  Diagnosis Date   Anemia    Anxiety    Aortic valve stenosis, severe    Arthritis    PAIN AND SEVERE OA LEFT KNEE ; S/P RIGHT TKA ON 02/03/12; HAS LOWER BACK PAIN-UNABLE TO STAND MORE THAN 10 MIN; ARTHRITIS "ALL OVER"   Asthma    Blood transfusion  2013[facility]   Breast cancer in female [facility])    Right   CAD (coronary artery disease)    Cath 2010 with DES x 1 RCA-- PT'S CARDIOLOGIST IS DR. MCALHANY   Chronic diastolic congestive heart failure (HCC)    COPD (chronic obstructive pulmonary disease) (HCC)    Depression    Diabetes mellitus DIAGNOSED IN2010   Dyspnea    with much ambulation   Eczema    on back   Headache    migraines younger- rare now 02/07/21   Heart murmur    no current problems   History of hiatal hernia    History of kidney stones    passed or blasted   Hyperlipidemia     Hypertension    Morbid obesity with body mass index of 60.0-69.9 in adult [facility])    Myocardial infarction ([place])    PT THINKS SHE WAS DX WITH MI AT THE TIME OF HEART STENTING   Neuromuscular disorder (HCC)    bilateral arm/hands   Oxygen dependent    uses 3L oxygen night/prn   Personal history of radiation therapy    Pneumonia    Pulmonary embolism ([place]) 02/08/2012   S/P RT TOTAL KNEE ON 02/03/12--ON 02/08/12--DEVELOPED ACUTE SOB AND CHEST PAIN--AND DIAGNOSED WITH  PULMONARY EMBOLUS AND PNEUMONIA   Restless leg syndrome    Sleep apnea    uses 3 liters O2 at night as needed   Uterine fibroid    NO PROBLEMS AT PRESENT FROM THE FIBROIDS-STATES SHE IS POST MENOPAUSAL-LAST MENSES 2010 EXCEPT FOR EPISODE THIS YR OF BLEEDING RELATED TO FIBROIDS.   Weakness    BOTH HANDS - S/P BILATERAL CARPAL TUNNEL RELEASE--BUT STILL HAS WEAKNESS--OFTEN DROPS THINGS    Past Surgical History:  Procedure Laterality Date   BACK SURGERY  02/11/2021   Dr Louanne Skye   BREAST BIOPSY Right 06/04/2018   BREAST LUMPECTOMY Right 06/2018   BREAST LUMPECTOMY WITH RADIOACTIVE SEED AND SENTINEL LYMPH NODE BIOPSY Right 07/19/2018   Procedure: BREAST LUMPECTOMY WITH RADIOACTIVE SEED AND SENTINEL LYMPH NODE BIOPSY;  Surgeon: Alphonsa Overall, MD;  Location: [place];  Service: General;  Laterality: Right;   CARDIAC CATHETERIZATION     CARDIAC VALVE REPLACEMENT     2017   CARPAL TUNNEL RELEASE     Bilateral   CHOLECYSTECTOMY     COLONOSCOPY     CORONARY ANGIOPLASTY     2010 has stent in place   CYSTOSCOPY W/ RETROGRADES Right 09/21/2013   Procedure: CYSTOSCOPY WITH RIGHT RETROGRADE PYELOGRAM RIGHT DOUBLE J STENT ;  Surgeon: Fredricka Bonine, MD;  [facility];  Service: Urology;  Laterality: Right;   CYSTOSCOPY W/ URETERAL STENT PLACEMENT Right 11/16/2022   Procedure: CYSTOSCOPY WITH RETROGRADE PYELOGRAM/URETERAL STENT PLACEMENT;  Surgeon: Vira Agar, MD;  Location: [place];  Service: Urology;  Laterality: Right;    CYSTOSCOPY WITH RETROGRADE PYELOGRAM, URETEROSCOPY AND STENT PLACEMENT Right 01/13/2023   Procedure: CYSTOSCOPY WITH RETROGRADE PYELOGRAM, URETEROSCOPY AND STENT EXCHANGE;  Surgeon: Primus Bravo., MD;  [facility];  Service: Urology;  Laterality: Right;   CYSTOSCOPY WITH URETEROSCOPY AND STENT PLACEMENT Right 10/25/2013   Procedure: CYSTOSCOPY RIGHT URETEROSCOPY HOLMIUM LASER LITHO AND STENT PLACEMENT;  Surgeon: Fredricka Bonine, MD;  [facility];  Service: Urology;  Laterality: Right;   HERNIA REPAIR  1740   umbilical ,   HOLMIUM LASER APPLICATION Right 08/11/4817   Procedure: HOLMIUM LASER APPLICATION;  Surgeon: Primus Bravo., MD;  [facility];  Service: Urology;  Laterality: Right;   INTRAOPERATIVE TRANSESOPHAGEAL ECHOCARDIOGRAM N/A 12/12/2014   Procedure: INTRAOPERATIVE TRANSESOPHAGEAL ECHOCARDIOGRAM;  Surgeon: Burnell Blanks, MD;  Location: [place];  Service: Cardiovascular;  Laterality: N/A;   JOINT REPLACEMENT     bil total knees   KNEE ARTHROPLASTY  02/03/2012   Procedure: COMPUTER ASSISTED TOTAL KNEE ARTHROPLASTY;  Surgeon: Mcarthur Rossetti, MD;  Location: [place];  Service: Orthopedics;  Laterality: Right;  Right total knee arthroplasty   LEFT AND RIGHT HEART CATHETERIZATION WITH CORONARY ANGIOGRAM N/A 03/17/2013   Procedure: LEFT AND RIGHT HEART CATHETERIZATION WITH CORONARY ANGIOGRAM;  Surgeon: Burnell Blanks, MD;  Location[facility] C[facility] LAB;  Service: Cardiovascular;  Laterality: N/A;   LEFT AND RIGHT HEART CATHETERIZATION WITH CORONARY/GRAFT ANGIOGRAM N/A 09/14/2014   Procedure: LEFT AND RIGHT HEART CATHETERIZATION WITH Beatrix Fetters;  Surgeon: Burnell Blanks, MD;  Location: [facility] CATH LAB;  Service: Cardiovascular;  Laterality: N/A;   REVERSE SHOULDER ARTHROPLASTY Left 03/06/2022   Procedure: LEFT SHOULDER REPLACEMENT APPLICATION OF WOUND VAC;  Surgeon: [name], MD;  Location: [place];  Service: Orthopedics;   Laterality: Left;   TEE WITHOUT CARDIOVERSION N/A 03/14/2013   Procedure: TRANSESOPHAGEAL ECHOCARDIOGRAM (TEE);  Surgeon: Lelon Perla, MD;  Location: [facility] ENDOSCOPY;  Service: Cardiovascular;  Laterality: N/A;   TEE WITHOUT CARDIOVERSION N/A 11/14/2014   Procedure: TRANSESOPHAGEAL ECHOCARDIOGRAM (TEE);  Surgeon: Lelon Perla, MD;  Location: [facility] ENDOSCOPY;  Service: Cardiovascular;  Laterality: N/A;   TONSILLECTOMY     maybe as a child- does not know   TOTAL KNEE ARTHROPLASTY  09/10/2012   Procedure: TOTAL KNEE ARTHROPLASTY;  Surgeon: Mcarthur Rossetti, MD;  [facility];  Service: Orthopedics;  Laterality: Left;   TOTAL KNEE REVISION Right 07/15/2013   Procedure: REVISION ARTHROPLASTY RIGHT KNEE;  Surgeon: Mcarthur Rossetti, MD;  [facility];  Service: Orthopedics;  Laterality: Right;   TRANSCATHETER AORTIC VALVE REPLACEMENT, TRANSFEMORAL N/A 12/12/2014   Procedure: TRANSCATHETER AORTIC VALVE REPLACEMENT, TRANSFEMORAL;  Surgeon: Burnell Blanks, MD;  Location: [place];  Service: Cardiovascular;  Laterality: N/A;   TRIGGER FINGER RELEASE  09/10/2012   Procedure: RELEASE TRIGGER FINGER/A-1 PULLEY;  Surgeon: Mcarthur Rossetti, MD;  [facility];  Service: Orthopedics;  Laterality: Right;  Right Ring Finger   TUBAL LIGATION      Family Psychiatric History: Mother alcohol use and brother intellectual delay   Family History:  Family History  Problem Relation Age of Onset   Breast cancer Mother        stage IV at diagnosis   Emphysema Mother        smoked   Heart disease Mother    COPD Father        smoked   Asthma Father    Heart disease Father    Cancer Brother        Sinus    Social History:  Social History   Socioeconomic History   Marital status: Married    Spouse name: Not on file   Number of children: 2   Years of education: Not on file   Highest education level: Not on file  Occupational History   Occupation: Disabled  Tobacco Use    Smoking status: Former    Packs/day: 1.50    Years: 30.00    Total pack years: 45.00    Types: Cigarettes    Quit date: 12/29/2000    Years since quitting: 22.1   Smokeless tobacco: Never  Vaping Use   Vaping Use:  Never used  Substance and Sexual Activity   Alcohol use: Not Currently   Drug use: Not Currently    Types: Marijuana    Comment: CBD oils through vape shops   Sexual activity: Not Currently    Birth control/protection: Surgical, Post-menopausal    Comment: tubal ligation  Other Topics Concern   Not on file  Social History Narrative   Not on file   Social Determinants of Health   Financial Resource Strain: Low Risk  (12/08/2022)   Overall Financial Resource Strain (CARDIA)    Difficulty of Paying Living Expenses: Not very hard  Food Insecurity: No Food Insecurity (11/16/2022)   Hunger Vital Sign    Worried About Running Out of Food in the Last Year: Never true    Ran Out of Food in the Last Year: Never true  Transportation Needs: No Transportation Needs (11/16/2022)   PRAPARE - Hydrologist (Medical): No    Lack of Transportation (Non-Medical): No  Physical Activity: Inactive (01/09/2023)   Exercise Vital Sign    Days of Exercise per Week: 0 days    Minutes of Exercise per Session: 0 min  Stress: Stress Concern Present (01/19/2023)   [place]    Feeling of Stress : Rather much  Social Connections: Moderately Isolated (01/09/2023)   Social Connection and Isolation Panel [NHANES]    Frequency of Communication with Friends and Family: More than three times a week    Frequency of Social Gatherings with Friends and Family: More than three times a week    Attends Religious Services: Never    Marine scientist or Organizations: No    Attends Music therapist: Never    Marital Status: Married    Allergies: No Known Allergies  Metabolic Disorder  Labs: Lab Results  Component Value Date   HGBA1C 6.6 (H) 01/08/2023   MPG 143 01/08/2023   MPG 182.9 02/14/2022   No results found for: "PROLACTIN" Lab Results  Component Value Date   CHOL 165 12/25/2022   TRIG 78 12/25/2022   HDL 49 12/25/2022   CHOLHDL 3.4 12/25/2022   VLDL 20 12/28/2015   LDLCALC 101 (H) 12/25/2022   [place] 87 06/04/2022   Lab Results  Component Value Date   TSH 2.820 09/25/2020   TSH 2.918 09/21/2013    Therapeutic Level Labs: No results found for: "LITHIUM" No results found for: "VALPROATE" No results found for: "CBMZ"  Current Medications: Current Outpatient Medications  Medication Sig Dispense Refill   Accu-Chek FastClix Lancets MISC Check blood sugar two times a day 102 each 9   acetaminophen (TYLENOL) 500 MG tablet Take 1,000 mg by mouth every 6 (six) hours as needed for moderate pain.     albuterol (PROVENTIL) (2.5 MG/3ML) 0.083% nebulizer solution USE 1 VIAL IN NEBULIZER EVERY 4 HOURS AS NEEDED FOR WHEEZING OR SHORTNESS OF BREATH 150 mL 1   amlodipine-atorvastatin (CADUET) 5-80 MG tablet Take 1 tablet by mouth daily. 90 tablet 3   ASPIRIN ADULT LOW STRENGTH 81 MG EC tablet TAKE 1 Tablet BY MOUTH ONCE DAILY 90 tablet 3   B-D UF III MINI PEN NEEDLES 31G X 5 MM MISC USE AS DIRECTED DAILY 100 each 5   Blood Glucose Monitoring Suppl (ACCU-CHEK GUIDE) w/Device KIT 1 each by Does not apply route 2 (two) times daily. 1 kit 1   buPROPion (WELLBUTRIN XL) 150 MG 24 hr tablet Take 1 tablet (150  mg total) by mouth daily. 30 tablet 3   busPIRone (BUSPAR) 15 MG tablet Take 1 tablet (15 mg total) by mouth 2 (two) times daily. 60 tablet 3   cholecalciferol (VITAMIN D3) 25 MCG (1000 UNIT) tablet Take 1,000 Units by mouth daily.     clopidogrel (PLAVIX) 75 MG tablet TAKE 1 TABLET BY MOUTH IN THE MORNING 90 tablet 3   Continuous Blood Gluc Sensor (FREESTYLE LIBRE 3 SENSOR) MISC Place 1 sensor on the skin every 14 days. Use to check glucose continuously 2 each 11    Dulaglutide (TRULICITY) 4.5 GY/6.9SW SOPN Inject 4.5 mg as directed once a week. 0.5 mL 3   Evolocumab (REPATHA SURECLICK) 546 MG/ML SOAJ INJECT 1 PEN INTO THE SKIN EVERY 14 DAYS 2 mL 11   exemestane (AROMASIN) 25 MG tablet TAKE 1 TABLET BY MOUTH ONCE DAILY AFTER BREAKFAST 90 tablet 3   ezetimibe (ZETIA) 10 MG tablet Take 1 tablet (10 mg total) by mouth daily. 90 tablet 3   ferrous sulfate 325 (65 FE) MG tablet Take 1 tablet (325 mg total) by mouth every other day. 30 tablet 3   Fluticasone-Umeclidin-Vilant (TRELEGY ELLIPTA) 100-62.5-25 MCG/ACT AEPB INHALE 1 PUFF ONCE DAILY Strength: 100-62.5-25 MCG/ACT 180 each 5   furosemide (LASIX) 40 MG tablet Take 1 tablet (40 mg total) by mouth as needed for fluid or edema. 180 tablet 1   gabapentin (NEURONTIN) 300 MG capsule TAKE 2 CAPSULES BY MOUTH IN THE MORNING AND TAKE 3 CAPSULES AT BEDTIME 150 capsule 0   glucose blood (ACCU-CHEK GUIDE) test strip Check blood sugar 2 times per day 100 each 9   hydrocortisone cream 1 % Apply 1 Application topically daily as needed for itching.     LANTUS SOLOSTAR 100 UNIT/ML Solostar Pen Inject 30 Units into the skin at bedtime.     losartan-hydrochlorothiazide (HYZAAR) 100-25 MG tablet Take 1 tablet by mouth daily. 90 tablet 3   metFORMIN (GLUCOPHAGE-XR) 500 MG 24 hr tablet Take 1 tablet (500 mg total) by mouth 2 (two) times daily. 90 tablet 2   metoprolol succinate (TOPROL-XL) 50 MG 24 hr tablet TAKE 1 TABLET BY MOUTH ONCE DAILY WITH MEALS OR  IMMEDIATELY  FOLLOWING  A  MEAL 90 tablet 2   montelukast (SINGULAIR) 10 MG tablet Take 1 tablet (10 mg total) by mouth at bedtime. 90 tablet 2   nitrofurantoin, macrocrystal-monohydrate, (MACROBID) 100 MG capsule Take 1 capsule (100 mg total) by mouth daily. 30 capsule 2   phenazopyridine (PYRIDIUM) 200 MG tablet Take 1 tablet (200 mg total) by mouth 3 (three) times daily as needed for pain. 20 tablet 1   senna-docusate (SENNA-TIME S) 8.6-50 MG tablet Take 1 tablet by mouth at  bedtime as needed for mild constipation. 30 tablet 1   traZODone (DESYREL) 50 MG tablet Take 1-2 tablets (50-100 mg total) by mouth at bedtime. 30 tablet 3   VENTOLIN HFA 108 (90 Base) MCG/ACT inhaler INHALE 2 PUFFS BY MOUTH EVERY 6 HOURS AS NEEDED FOR WHEEZING FOR SHORTNESS OF BREATH 18 g 3   No current facility-administered medications for this visit.     Musculoskeletal: Strength & Muscle Tone: within normal limits and Telehealth visit Gait & Station: normal, Telehealth Patient leans: N/A  Psychiatric Specialty Exam: Review of Systems  Last menstrual period 02/03/2012.There is no height or weight on file to calculate BMI.  General Appearance: Well Groomed  Eye Contact:  Good  Speech:  Clear and Coherent and Normal Rate  Volume:  Normal  Mood:  Euthymic  Affect:  Appropriate and Congruent  Thought Process:  Coherent, Goal Directed, and Linear  Orientation:  Full (Time, Place, and Person)  Thought Content: WDL and Logical   Suicidal Thoughts:  No  Homicidal Thoughts:  No  Memory:  Immediate;   Good Recent;   Good Remote;   Good  Judgement:  Good  Insight:  Good  Psychomotor Activity:  Normal  Concentration:  Concentration: Good and Attention Span: Good  Recall:  Good  Fund of Knowledge: Good  Language: Good  Akathisia:  No  Handed:  Right  AIMS (if indicated): not done  Assets:  Communication Skills Desire for Improvement Financial Resources/Insurance Housing Leisure Time Physical Health Social Support  ADL's:  Intact  Cognition: WNL  Sleep:  Good   Screenings: GAD-7    Flowsheet Row Video Visit from 02/04/2023 in [facility] Counselor from 01/02/2023 in [facility] Counselor from 12/11/2022 in [facility] Video Visit from 11/06/2022 in [facility] Office Visit from 10/08/2022 in [facility] [place] [facility]  Total  GAD-7 Score '6 11 4 14 5      '$ PHQ2-9    Flowsheet Row Video Visit from 02/04/2023 in [facility] Counselor from 01/02/2023 in [facility] Counselor from 12/11/2022 in [facility] Video Visit from 11/06/2022 in [facility] Office Visit from 10/08/2022 in [place] [facility] [place] [facility]  PHQ-2 Total Score '2 2 1 4 '$ 0  PHQ-9 Total Score '6 7 3 9 6      '$ Flowsheet Row Admission (Discharged) from 01/13/2023 in [facility] from 01/08/2023 in [place] [facility] Hosp-Admission (Discharged) from 11/16/2022 in [facility]  C-SSRS RISK CATEGORY No Risk No Risk No Risk        Assessment and Plan: Patient reports that since starting Wellbutrin her depression has improved.  She now notes that she has more energy and is less anxious.Provider discussed the risk of being on 3 antidepressants and notes that it can cause serotonin syndrome.  She endorsed understanding and agreed.  Provider recommended discontinuing Celexa and increasing BuSpar.  She was agreeable to this.  Today BuSpar increased from 10 mg twice daily to 15 mg twice daily.  She will continue all other medications as prescribed  1. GAD (generalized anxiety disorder)  Increased- busPIRone (BUSPAR) 15 MG tablet; Take 1 tablet (15 mg total) by mouth 2 (two) times daily.  Dispense: 60 tablet; Refill: 3 Continue- traZODone (DESYREL) 50 MG tablet; Take 1-2 tablets (50-100 mg total) by mouth at bedtime.  Dispense: 30 tablet; Refill: 3  2. PTSD (post-traumatic stress disorder)  Increased- busPIRone (BUSPAR) 15 MG tablet; Take 1 tablet (15 mg total) by mouth 2 (two) times daily.  Dispense: 60 tablet; Refill: 3 Continue- traZODone (DESYREL) 50 MG tablet; Take 1-2 tablets (50-100 mg total) by mouth at bedtime.   Dispense: 30 tablet; Refill: 3 Continue- buPROPion (WELLBUTRIN XL) 150 MG 24 hr tablet; Take 1 tablet (150 mg total) by mouth daily.  Dispense: 30 tablet; Refill: 3  3. Mild depression  Increased- busPIRone (BUSPAR) 15 MG tablet; Take 1 tablet (15 mg total) by mouth 2 (two) times daily.  Dispense: 60 tablet; Refill: 3 Continue - traZODone (DESYREL) 50 MG tablet; Take 1-2 tablets (50-100  mg total) by mouth at bedtime.  Dispense: 30 tablet; Refill: 3 Continue- buPROPion (WELLBUTRIN XL) 150 MG 24 hr tablet; Take 1 tablet (150 mg total) by mouth daily.  Dispense: 30 tablet; Refill: 3    Collaboration of Care: Collaboration of Care: Other provider involved in patient's care AEB PCP and counselor  Patient/Guardian was advised Release of Information must be obtained prior to any record release in order to collaborate their care with an outside provider. Patient/Guardian was advised if they have not already done so to contact the registration department to sign all necessary forms in order for [place] to release information regarding their care.   Consent: Patient/Guardian gives verbal consent for treatment and assignment of benefits for services provided during this visit. Patient/Guardian expressed understanding and agreed to proceed.   Follow-up in 3 months Follow-up therapy Salley Slaughter, NP 02/04/2023, 11:06 AM

## 2023-02-06 ENCOUNTER — Encounter: Payer: Self-pay | Admitting: Obstetrics and Gynecology

## 2023-02-06 ENCOUNTER — Other Ambulatory Visit: Payer: Medicaid Other | Admitting: Obstetrics and Gynecology

## 2023-02-06 NOTE — Patient Instructions (Signed)
Hi [name], glad you are feeling better-have a wonderful day and weekend!!  [name] was given information about Medicaid Managed Care team care coordination services as a part of their [facility] Medicaid benefit. [name] verbally consented to engagement with the [facility] Managed Care team.   If you are experiencing a medical emergency, please call 911 or report to your local emergency department or urgent care.   If you have a non-emergency medical problem during routine business hours, please contact your provider's office and ask to speak with a nurse.   For questions related to your [facility] health plan, please call: [phone] or go here:[email or web address]  If you would like to schedule transportation through your [facility] plan, please call the following number at least 2 days in advance of your appointment: [phone].  You can also use the MTM portal or MTM mobile app to manage your rides. For the portal, please go to [email or web address].  Call the [place] at [phone], at any time, 24 hours a day, 7 days a week. If you are in danger or need immediate medical attention call 911.  If you would like help to quit smoking, call 1-800-QUIT-NOW 9725085824) OR Espaol: 1-855-Djelo-Ya HD:1601594) o para ms informacin haga clic aqu or Text READY to 200-400 to register via text  [name] - following are the goals we discussed in your visit today:   Goals Addressed    Timeframe:  Long-Range Goal Priority:  High Start Date:      04/14/22                       Expected End Date:     ongoing                  Follow Up Date 03/09/23   - schedule appointment for flu shot - schedule appointment for vaccines needed due to my age or health - schedule recommended health tests (blood work, mammogram, colonoscopy, pap test) - schedule and keep appointment for annual check-up    Why is this important?   Screening  tests can find diseases early when they are easier to treat.  Your doctor or nurse will talk with you about which tests are important for you.  Getting shots for common diseases like the flu and shingles will help prevent them.  02/06/23:  Patient has urology f/u 2/28 and echo 2/21.  Has LCSW f/u next week.  Patient verbalizes understanding of instructions and care plan provided today and agrees to view in [place]. Active MyChart status and patient understanding of how to access instructions and care plan via MyChart confirmed with patient.     The Managed Medicaid care management team will reach out to the patient again over the next 30 business  days.  The  Patient has been provided with contact information for the Managed Medicaid care management team and has been advised to call with any health related questions or concerns.   Aida Raider RN, BSN [place] Management Coordinator - Managed Medicaid High Risk [phone]   Following is a copy of your plan of care:  Care Plan : [place] of Care  Updates made by Gayla Medicus, RN since 02/06/2023 12:00 AM     Problem: Health Promotion or Disease Self-Management (General Plan of Care)   Priority: High  Onset Date: 04/14/2022     Long-Range Goal: Chronic Disease Management and Care  Coordination Needs   Start Date: 04/14/2022  Expected End Date: 04/10/2023  Recent Progress: Not on track  Priority: High  Note:   Current Barriers:  Knowledge Deficits related to plan of care for management of Anxiety and Depression along with other chronic health conditions, HTN, DM, HF, asthma, COPD, OSA, arthritis, GERD, CAD, HLD, h/o breast cancer, RLS Chronic Disease Management support and education needs related to Anxiety and Depression. DM, HTN, HF, asthma, OSA, arthritis, GERD, CAD, HLD, h/o breast cancer, RLS. 02/06/23:  patient doing well after urinary stent placement 1/16.  Ambien discontinued and  Trazodone added-this is helpful.  Patient also started on Wellbutrin.  Blood sugars stable-139-175.  Breathing WNL. RNCM Clinical Goal(s):  Patient will verbalize understanding of plan for management of Anxiety and Depression, DM, HTN  as evidenced by patient report take all medications exactly as prescribed and will call provider for medication related questions as evidenced by patient report demonstrate understanding of rationale for each prescribed medication as evidenced by patient report attend all scheduled medical appointments  as evidenced by patient report continue to work with RN Care Manager to address care management and care coordination needs related to  Anxiety and Depression, DM, HTN as evidenced by adherence to CM Team Scheduled appointments work with Education officer, museum to address  related to the management of Mental Health Concerns  related to the management of Anxiety and Depression as evidenced by review of EMR and patient or Education officer, museum report through collaboration with Consulting civil engineer, provider, and care team.   Interventions: Inter-disciplinary care team collaboration (see longitudinal plan of care) Evaluation of current treatment plan related to  self management and patient's adherence to plan as established by provider   (Status:  New goal.)  Long Term Goal Evaluation of current treatment plan related to Anxiety and Depression, Mental Health Concerns  self-management and patient's adherence to plan as established by provider. Discussed plans with patient for ongoing care management follow up and provided patient with direct contact information for care management team Advised patient to contact provider for any additional DME needs, CBG, and possible general surgeon referral. Reviewed medications with patient Collaborated with LCSW regarding anxiety/depression Reviewed scheduled/upcoming provider appointments Advised patient, providing education and rationale, to check cbg  and record, calling provider  for findings outside established parameters  Social Work referral for anxiety/depression Discussed plans with patient for ongoing care management follow up and provided patient with direct contact information for care management team Assessed social determinant of health barriers Collaborated with [place] Medicaid Liaison regarding DME-motorized wheelchair and lift chair.  Diabetes Interventions:  (Status:  New goal.) Long Term Goal Assessed patient's understanding of A1c goal: <7% Reviewed medications with patient and discussed importance of medication adherence Reviewed scheduled/upcoming provider appointments  Advised patient, providing education and rationale, to check cbg and record, calling provider for findings outside established parameters Review of patient status, including review of consultants reports, relevant laboratory and other test results, and medications completed Assessed social determinant of health barriers Lab Results  Component Value Date   HGBA1C 7.1 (A) 08/27/2022  HGB A1C  6.6 on 01/08/23  Hypertension Interventions:  (Status:  New goal.) Long Term Goal Last practice recorded BP readings:  BP Readings from Last 3 Encounters:  09/12/22 133/69  08/27/22 (!) 109/58  07/01/22 137/75  12/25/22          124/60  Most recent eGFR/CrCl:  Lab Results  Component Value Date   EGFR 90 06/04/2022  No components found for: "CRCL"  Evaluation of current treatment plan related to hypertension self management and patient's adherence to plan as established by provider Reviewed medications with patient and discussed importance of compliance Discussed plans with patient for ongoing care management follow up and provided patient with direct contact information for care management team Advised patient, providing education and rationale, to monitor blood pressure daily and record, calling PCP for findings outside established  parameters Reviewed scheduled/upcoming provider appointments including:  Assessed social determinant of health barriers   Patient Goals/Self-Care Activities: Take all medications as prescribed Attend all scheduled provider appointments Call pharmacy for medication refills 3-7 days in advance of running out of medications Perform all self care activities independently  Perform IADL's (shopping, preparing meals, housekeeping, managing finances) independently Call provider office for new concerns or questions  Work with the social worker to address care coordination needs and will continue to work with the clinical team to address health care and disease management related needs  Follow Up Plan:  The patient has been provided with contact information for the care management team and has been advised to call with any health related questions or concerns.  The care management team will reach out to the patient again over the next 30 business  days.

## 2023-02-06 NOTE — Patient Outreach (Signed)
Medicaid Managed Care   Nurse Care Manager Note  02/06/2023 Name:  [name] MRN:  [id number] DOB:  [birth year]  [name] is an 62 y.o. year old female who is a primary patient of [name].  The [facility] Managed Care Coordination team was consulted for assistance with:    Chronic healthcare management needs, HTN, DM, HF, asthma, COPD, OSA, anxiety, depression, PTSD, arthritis, GERD, CAD, headaches, h/o breast cancer, RLS  [name] was given information about Medicaid Managed Care Coordination team services today. [name] Patient agreed to services and verbal consent obtained.  Engaged with patient by telephone for follow up visit in response to provider referral for case management and/or care coordination services.   Assessments/Interventions:  Review of past medical history, allergies, medications, health status, including review of consultants reports, laboratory and other test data, was performed as part of comprehensive evaluation and provision of chronic care management services.  SDOH (Social Determinants of Health) assessments and interventions performed: SDOH Interventions    Flowsheet Row Patient Outreach Telephone from 02/06/2023 in [place] Most recent reading at 02/06/2023 10:51 AM Video Visit from 02/04/2023 in [facility] Most recent reading at 02/04/2023 10:54 AM Patient Outreach Telephone from 01/19/2023 in [place] Most recent reading at 01/19/2023 11:26 AM Patient Outreach Telephone from 01/09/2023 in [place] Most recent reading at 01/09/2023  3:56 PM Patient Outreach Telephone from 01/09/2023 in [place] Most recent reading at 01/09/2023 10:40 AM Counselor from 01/02/2023 in [facility] Most recent reading at 01/02/2023 11:19 AM  SDOH Interventions        Food  Insecurity Interventions Intervention Not Indicated -- -- -- -- --  Housing Interventions Intervention Not Indicated -- -- -- -- --  Depression Interventions/Treatment  -- Currently on Treatment -- -- -- Currently on Treatment  Physical Activity Interventions -- -- -- Other (Comments)  [patient not able to engage in this level of exercise] -- --  Stress Interventions -- -- [employer], Provide Counseling -- [employer], Provide Counseling --  Social Connections Interventions -- -- -- Intervention Not Indicated -- --     Care Plan  No Known Allergies  Medications Reviewed Today     Reviewed by [name] Medicus, RN (Registered Nurse) on 02/06/23 at 1051  Med List Status: <None>   Medication Order Taking? Sig Documenting Provider Last Dose Status Informant  Accu-Chek FastClix Lancets MISC [id number] No Check blood sugar two times a day [name] Nimrod, [name] Taking Active Self  acetaminophen (TYLENOL) 500 MG tablet [id number] No Take 1,000 mg by mouth every 6 (six) hours as needed for moderate pain. Provider, Historical, [name] Taking Active Self  albuterol (PROVENTIL) (2.5 MG/3ML) 0.083% nebulizer solution [id number] No USE 1 VIAL IN NEBULIZER EVERY 4 HOURS AS NEEDED FOR WHEEZING OR SHORTNESS OF BREATH Amponsah, Charisse March, [name] Taking Active Self  amlodipine-atorvastatin (CADUET) 5-80 MG tablet [id number] No Take 1 tablet by mouth daily. [name] Aurora, DO Taking Active Self  ASPIRIN ADULT LOW STRENGTH 81 MG EC tablet [id number] No TAKE 1 Tablet BY MOUTH ONCE DAILY [name] Nimrod, [name] Taking Active Self  B-D UF III MINI PEN NEEDLES 31G X 5 MM MISC [id number] No USE AS DIRECTED DAILY [name] Pou, [name] Taking Active Self  Blood Glucose Monitoring Suppl (ACCU-CHEK GUIDE) w/Device KIT [id number] No 1 each by Does not apply route  2 (two) times daily. [name] Nimrod, [name] Taking Active Self  buPROPion (WELLBUTRIN XL) 150 MG 24 hr tablet [id number]  Take 1 tablet (150 mg  total) by mouth daily. [name] Slaughter, NP  Active   busPIRone (BUSPAR) 15 MG tablet [id number]  Take 1 tablet (15 mg total) by mouth 2 (two) times daily. [name] Slaughter, NP  Active   cholecalciferol (VITAMIN D3) 25 MCG (1000 UNIT) tablet [id number] No Take 1,000 Units by mouth daily. Provider, Historical, [name] Taking Active Self  clopidogrel (PLAVIX) 75 MG tablet [id number] No TAKE 1 TABLET BY MOUTH IN THE [name] Conception, [name] Taking Active Self  Continuous Blood Gluc Sensor (FREESTYLE LIBRE 3 SENSOR) [place] [id number] No Place 1 sensor on the skin every 14 days. Use to check glucose continuously [name] Taking Active Self  Dulaglutide (TRULICITY) 4.5 0000000 SOPN [id number] No Inject 4.5 mg as directed once a week. [name] Opitz, [name] Taking Active Self           Med Note Terrilyn Saver Jan 07, 2023 11:24 AM) Saturdays  Evolocumab (REPATHA SURECLICK) [id number] MG/ML [name] [id number] No INJECT 1 PEN INTO THE SKIN EVERY 14 DAYS [name] Blanks, [name] Taking Active Self  exemestane (AROMASIN) 25 MG tablet [id number] No TAKE 1 TABLET BY MOUTH ONCE DAILY AFTER [name] Taking Active Self  ezetimibe (ZETIA) 10 MG tablet [id number] No Take 1 tablet (10 mg total) by mouth daily. [name] Burn, PA-C 11/15/2022 Expired 01/07/23 2359 Self  ferrous sulfate 325 (65 FE) MG tablet [id number] No Take 1 tablet (325 mg total) by mouth every other day. [name] Axe, [name] Taking Active Self  Discontinued 05/09/20 1429   Fluticasone-Umeclidin-Vilant (TRELEGY ELLIPTA) 100-62.5-25 MCG/ACT AEPB [id number] No INHALE 1 PUFF ONCE DAILY Strength: 100-62.5-25 MCG/ACT [name] Taking Active Self  furosemide (LASIX) 40 MG tablet [id number] No Take 1 tablet (40 mg total) by mouth as needed for fluid or edema. [name] Clan, DO Taking Active Self  gabapentin (NEURONTIN) 300 MG capsule [id number]  TAKE 2 CAPSULES BY MOUTH IN THE MORNING AND TAKE 3 CAPSULES AT BEDTIME  [name]  Active   glucose blood (ACCU-CHEK GUIDE) test strip [id number] No Check blood sugar 2 times per day [name] Taking Active Self  hydrocortisone cream 1 % [id number] No Apply 1 Application topically daily as needed for itching. Provider, Historical, [name] Taking Active Self  LANTUS SOLOSTAR 100 UNIT/ML Solostar Pen [id number] No Inject 30 Units into the skin at bedtime. Provider, Historical, [name] Taking Active Self  losartan-hydrochlorothiazide (HYZAAR) 100-25 MG tablet [id number] No Take 1 tablet by mouth daily. [name] 01/12/2023 Expired 01/13/23 2359 Self  metFORMIN (GLUCOPHAGE-XR) 500 MG 24 hr tablet [id number] No Take 1 tablet (500 mg total) by mouth 2 (two) times daily. [name] Taking Active Self  metoprolol succinate (TOPROL-XL) 50 MG 24 hr tablet [id number] No TAKE 1 TABLET BY MOUTH ONCE DAILY WITH MEALS OR  IMMEDIATELY  FOLLOWING  A  MEAL [name] Taking Active Self  montelukast (SINGULAIR) 10 MG tablet [id number] No Take 1 tablet (10 mg total) by mouth at bedtime. [name] Taking Active Self  nitrofurantoin, macrocrystal-monohydrate, (MACROBID) 100 MG capsule [id number] No Take 1 capsule (100 mg total) by mouth daily. Stoneking, Reece Leader., [name] Taking Active Self  phenazopyridine (PYRIDIUM) 200 MG tablet [id number] No Take 1 tablet (200 mg total) by mouth 3 (three) times daily as needed for  pain. Stoneking, Reece Leader., [name] Taking Active   senna-docusate (SENNA-TIME S) 8.6-50 MG tablet [id number] No Take 1 tablet by mouth at bedtime as needed for mild constipation. [name] Axe, [name] Taking Active Self  traZODone (DESYREL) 50 MG tablet [id number]  Take 1-2 tablets (50-100 mg total) by mouth at bedtime. [name] Slaughter, NP  Active   VENTOLIN HFA 108 (90 Base) MCG/ACT inhaler [id number] No INHALE 2 PUFFS BY MOUTH EVERY 6 HOURS AS NEEDED FOR WHEEZING FOR SHORTNESS OF Loyal Gambler, [name] Taking Active Self            Patient Active Problem List   Diagnosis Date Noted   History of UTI 12/05/2022   Sepsis due to Escherichia coli with encephalopathy and septic shock ([place]) 11/17/2022   COPD exacerbation ([place]) 11/16/2022   Diabetic neuropathy ([place]) 08/29/2022   Vaginal lesion 08/29/2022   PTSD (post-traumatic stress disorder) 06/24/2022   GAD (generalized anxiety disorder) 06/24/2022   Major depressive disorder, recurrent episode, moderate ([place]) 06/24/2022   Urinary incontinence 06/20/2022   Intertrigo 06/20/2022   Arthritis of left shoulder region    S/P reverse total shoulder arthroplasty, 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   Healthcare maintenance 11/17/2021   Spondylolisthesis, lumbar region 02/11/2021    Class: Acute   Malignant neoplasm of upper-outer quadrant of right breast in female, estrogen receptor positive ([place]) 06/10/2018   Restless leg syndrome 02/04/2018   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   Depression 08/06/2015   Hypertension associated with diabetes ([place])    Aortic stenosis    Chronic diastolic congestive heart failure (HCC)    COPD (chronic obstructive pulmonary disease) ([place]) 09/21/2013   Obstructive sleep apnea 09/21/2013   Nephrolithiasis 09/21/2013   Nocturnal hypoxemia 09/12/2013   Hyperlipidemia 11/30/2012   History of pulmonary embolus (PE) 07/09/2012   Normocytic anemia 02/08/2012   Type 2 diabetes mellitus without complication, without long-term current use of insulin ([place]) 02/08/2012   Asthma 02/08/2012   GERD (gastroesophageal reflux disease)    CAD (coronary artery disease) 12/08/2011   Conditions to be addressed/monitored per PCP order:  Chronic healthcare management needs, HTN, DM, HF, asthma, COPD, OSA, anxiety, depression, PTSD, arthritis, GERD, CAD, headaches, h/o breast cancer, RLS  Care Plan : RN Care Manager Plan of Care  Updates made by [name] Medicus, RN since 02/06/2023 12:00 AM     Problem: Health Promotion or Disease Self-Management (General Plan  of Care)   Priority: High  Onset Date: 04/14/2022     Long-Range Goal: Chronic Disease Management and Care Coordination Needs   Start Date: 04/14/2022  Expected End Date: 04/10/2023  Recent Progress: Not on track  Priority: High  Note:   Current Barriers:  Knowledge Deficits related to plan of care for management of Anxiety and Depression along with other chronic health conditions, HTN, DM, HF, asthma, COPD, OSA, arthritis, GERD, CAD, HLD, h/o breast cancer, RLS Chronic Disease Management support and education needs related to Anxiety and Depression. DM, HTN, HF, asthma, OSA, arthritis, GERD, CAD, HLD, h/o breast cancer, RLS. 02/06/23:  patient doing well after urinary stent placement 1/16.  Ambien discontinued and Trazodone added-this is helpful.  Patient also started on Wellbutrin.  Blood sugars stable-139-175.  Breathing WNL. RNCM Clinical Goal(s):  Patient will verbalize understanding of plan for management of Anxiety and Depression, DM, HTN  as evidenced by patient report take all medications exactly as prescribed and will call provider for medication related questions as evidenced by patient report demonstrate understanding of rationale for each prescribed  medication as evidenced by patient report attend all scheduled medical appointments  as evidenced by patient report continue to work with RN Care Manager to address care management and care coordination needs related to  Anxiety and Depression, DM, HTN as evidenced by adherence to CM Team Scheduled appointments work with Education officer, museum to address  related to the management of Mental Health Concerns  related to the management of Anxiety and Depression as evidenced by review of EMR and patient or Education officer, museum report through collaboration with Consulting civil engineer, provider, and care team.   Interventions: Inter-disciplinary care team collaboration (see longitudinal plan of care) Evaluation of current treatment plan related to  self management  and patient's adherence to plan as established by provider   (Status:  New goal.)  Long Term Goal Evaluation of current treatment plan related to Anxiety and Depression, Mental Health Concerns  self-management and patient's adherence to plan as established by provider. Discussed plans with patient for ongoing care management follow up and provided patient with direct contact information for care management team Advised patient to contact provider for any additional DME needs, CBG, and possible general surgeon referral. Reviewed medications with patient Collaborated with LCSW regarding anxiety/depression Reviewed scheduled/upcoming provider appointments Advised patient, providing education and rationale, to check cbg and record, calling provider  for findings outside established parameters  Social Work referral for anxiety/depression Discussed plans with patient for ongoing care management follow up and provided patient with direct contact information for care management team Assessed social determinant of health barriers Collaborated with [place] Medicaid Liaison regarding DME-motorized wheelchair and lift chair.  Diabetes Interventions:  (Status:  New goal.) Long Term Goal Assessed patient's understanding of A1c goal: <7% Reviewed medications with patient and discussed importance of medication adherence Reviewed scheduled/upcoming provider appointments  Advised patient, providing education and rationale, to check cbg and record, calling provider for findings outside established parameters Review of patient status, including review of consultants reports, relevant laboratory and other test results, and medications completed Assessed social determinant of health barriers Lab Results  Component Value Date   HGBA1C 7.1 (A) 08/27/2022  HGB A1C  6.6 on 01/08/23  Hypertension Interventions:  (Status:  New goal.) Long Term Goal Last practice recorded BP readings:  BP  Readings from Last 3 Encounters:  09/12/22 133/69  08/27/22 (!) 109/58  07/01/22 137/75  12/25/22          124/60  Most recent eGFR/CrCl:  Lab Results  Component Value Date   EGFR 90 06/04/2022    No components found for: "CRCL"  Evaluation of current treatment plan related to hypertension self management and patient's adherence to plan as established by provider Reviewed medications with patient and discussed importance of compliance Discussed plans with patient for ongoing care management follow up and provided patient with direct contact information for care management team Advised patient, providing education and rationale, to monitor blood pressure daily and record, calling PCP for findings outside established parameters Reviewed scheduled/upcoming provider appointments including:  Assessed social determinant of health barriers   Patient Goals/Self-Care Activities: Take all medications as prescribed Attend all scheduled provider appointments Call pharmacy for medication refills 3-7 days in advance of running out of medications Perform all self care activities independently  Perform IADL's (shopping, preparing meals, housekeeping, managing finances) independently Call provider office for new concerns or questions  Work with the social worker to address care coordination needs and will continue to work with the clinical team to address health care and  disease management related needs  Follow Up Plan:  The patient has been provided with contact information for the care management team and has been advised to call with any health related questions or concerns.  The care management team will reach out to the patient again over the next 30 business  days.    Long-Range Goal: Establish Plan of Care for Chronic Disease Management Needs   Priority: High  Note:   Timeframe:  Long-Range Goal Priority:  High Start Date:      04/14/22                       Expected End Date:     ongoing                   Follow Up Date 03/09/23   - schedule appointment for flu shot - schedule appointment for vaccines needed due to my age or health - schedule recommended health tests (blood work, mammogram, colonoscopy, pap test) - schedule and keep appointment for annual check-up    Why is this important?   Screening tests can find diseases early when they are easier to treat.  Your doctor or nurse will talk with you about which tests are important for you.  Getting shots for common diseases like the flu and shingles will help prevent them.  02/06/23:  Patient has urology f/u 2/28 and echo 2/21.  Has LCSW f/u next week.   Follow Up:  Patient agrees to Care Plan and Follow-up.  Plan: The Managed Medicaid care management team will reach out to the patient again over the next 30 business  days. and The  Patient has been provided with contact information for the Managed Medicaid care management team and has been advised to call with any health related questions or concerns.  Date/time of next scheduled RN care management/care coordination outreach:  03/09/23 at 1030.

## 2023-02-09 ENCOUNTER — Telehealth: Payer: Self-pay | Admitting: *Deleted

## 2023-02-09 ENCOUNTER — Other Ambulatory Visit: Payer: Medicaid Other | Admitting: Licensed Clinical Social Worker

## 2023-02-09 NOTE — Patient Instructions (Signed)
Visit Information  [name] was given information about Medicaid Managed Care team care coordination services as a part of their [facility] Medicaid benefit. [name] verbally consented to engagement with the [facility] Managed Care team.   If you are experiencing a medical emergency, please call 911 or report to your local emergency department or urgent care.   If you have a non-emergency medical problem during routine business hours, please contact your provider's office and ask to speak with a nurse.   For questions related to your [facility] health plan, please call: [phone] or go here:[email or web address]  If you would like to schedule transportation through your [facility] plan, please call the following number at least 2 days in advance of your appointment: [phone].  You can also use the MTM portal or MTM mobile app to manage your rides. For the portal, please go to [email or web address].  Call the [place] at [phone], at any time, 24 hours a day, 7 days a week. If you are in danger or need immediate medical attention call 911.  If you would like help to quit smoking, call 1-800-QUIT-NOW (1-(701) 265-9917) OR Espaol: 1-855-Djelo-Ya   Following is a copy of your plan of care:  Care Plan : LCSW Plan of Care  Updates made by Greg Cutter, LCSW since 02/09/2023 12:00 AM     Problem: Depression Identification (Depression)      Long-Range Goal: Depressive Symptoms Identified   Start Date: 04/17/2022  Recent Progress: On track  Note:   Long-Range Goal: I want to start mental health treatment    Start Date: 04/17/2022  Priority: High  Note:   Priority: High   Timeframe:  Long-Range Goal Priority:  High Start Date:   04/17/22                Expected End Date:  ongoing                 Follow Up Date--03/04/23 at 245 pm  - check out counseling - keep 90 percent of counseling appointments - schedule counseling  appointment    Why is this important?             Beating depression may take some time.            If you don't feel better right away, don't give up on your treatment plan.     Current barriers:   Chronic Mental Health needs related to depression, stress and anxiety. Patient requires Support, Education, Resources, Referrals, Advocacy, and Care Coordination, in order to meet Unmet Mental Health Needs. Patient will implement clinical interventions discussed today to decrease symptoms of depression and increase knowledge and/or ability of: coping skills. Mental Health Concerns and Social Isolation Patient lacks knowledge of available community counseling agencies and resources.   Clinical Goal(s): verbalize understanding of plan for management of Anxiety, Depression, and Stress and demonstrate a reduction in symptoms. Patient will connect with a provider for ongoing mental health treatment, increase coping skills, healthy habits, self-management skills, and stress reduction

## 2023-02-09 NOTE — Patient Outreach (Signed)
Medicaid Managed Care Social Work Note  02/09/2023 Name:  [name] MRN:  [id number] DOB:  11-Sep-1961  [name] is an 62 y.o. year old female who is a primary patient of [name], MD.  The [facility] team was consulted for assistance with:  [name] and Resources  [name] was given information about Medicaid Managed Care Coordination team services today. [name] Patient agreed to services and verbal consent obtained.  Engaged with patient  for by telephone forinitial visit in response to referral for case management and/or care coordination services.   Assessments/Interventions:  Review of past medical history, allergies, medications, health status, including review of consultants reports, laboratory and other test data, was performed as part of comprehensive evaluation and provision of chronic care management services.  SDOH: (Social Determinant of Health) assessments and interventions performed: SDOH Interventions    Flowsheet Row Patient Outreach Telephone from 02/09/2023 in [place] Most recent reading at 02/09/2023  3:21 PM Patient Outreach Telephone from 02/06/2023 in [place] Most recent reading at 02/06/2023 10:51 AM Patient Outreach Telephone from 01/19/2023 in [place] Most recent reading at 01/19/2023 11:26 AM Patient Outreach Telephone from 01/09/2023 in [place] Most recent reading at 01/09/2023  3:56 PM Patient Outreach Telephone from 01/09/2023 in [place] Most recent reading at 01/09/2023 10:40 AM Patient Outreach Telephone from 12/08/2022 in [place] Most recent reading at 12/08/2022 10:22 AM  SDOH Interventions        Food Insecurity Interventions -- Intervention Not Indicated -- -- -- --  Housing Interventions  -- Intervention Not Indicated -- -- -- --  Alcohol Usage Interventions -- -- -- -- -- Intervention Not Indicated (Score <7)  Financial Strain Interventions -- -- -- -- -- Intervention Not Indicated  Physical Activity Interventions -- -- -- Other (Comments)  [patient not able to engage in this level of exercise] -- --  Stress Interventions Offered [employer], Provide Counseling -- Offered [employer], Provide Counseling -- Offered [employer], Provide Counseling --  Social Connections Interventions -- -- -- Intervention Not Indicated -- --       Advanced Directives Status:  See Care Plan for related entries.  Care Plan                 No Known Allergies  Medications Reviewed Today     Reviewed by Gayla Medicus, RN (Registered Nurse) on 02/06/23 at 1051  Med List Status: <None>   Medication Order Taking? Sig Documenting Provider Last Dose Status Informant  Accu-Chek FastClix Lancets MISC [id number] No Check blood sugar two times a day Lorella Nimrod, MD Taking Active Self  acetaminophen (TYLENOL) 500 MG tablet [id number] No Take 1,000 mg by mouth every 6 (six) hours as needed for moderate pain. [provider] Taking Active Self  albuterol (PROVENTIL) (2.5 MG/3ML) 0.083% nebulizer solution [id number] No USE 1 VIAL IN NEBULIZER EVERY 4 HOURS AS NEEDED FOR WHEEZING OR SHORTNESS OF BREATH Amponsah, Charisse March, MD Taking Active Self  amlodipine-atorvastatin (CADUET) 5-80 MG tablet [id number] No Take 1 tablet by mouth daily. Leigh Aurora, DO Taking Active Self  ASPIRIN ADULT LOW STRENGTH 81 MG EC tablet [id number] No TAKE 1 Tablet BY MOUTH ONCE DAILY Lorella Nimrod, MD Taking Active Self  B-D UF III MINI PEN NEEDLES 31G X 5 MM MISC [id number] No USE AS  DIRECTED DAILY Angelica Pou, MD Taking Active Self  Blood Glucose Monitoring Suppl (ACCU-CHEK GUIDE) w/Device KIT [id number] No 1 each by Does not apply route 2 (two) times daily. Lorella Nimrod, MD Taking Active Self  buPROPion (WELLBUTRIN XL) 150 MG 24 hr tablet [id number]  Take 1 tablet (150 mg total) by mouth daily. Salley Slaughter, NP  Active   busPIRone (BUSPAR) 15 MG tablet [id number]  Take 1 tablet (15 mg total) by mouth 2 (two) times daily. Salley Slaughter, NP  Active   cholecalciferol (VITAMIN D3) 25 MCG (1000 UNIT) tablet [id number] No Take 1,000 Units by mouth daily. [provider] Taking Active Self  clopidogrel (PLAVIX) 75 MG tablet [id number] No TAKE 1 TABLET BY MOUTH IN THE [name], MD Taking Active Self  Continuous Blood Gluc Sensor (FREESTYLE LIBRE 3 SENSOR) [place] [id number] No Place 1 sensor on the skin every 14 days. Use to check glucose continuously [name], MD Taking Active Self  Dulaglutide (TRULICITY) 4.5 0000000 SOPN [id number] No Inject 4.5 mg as directed once a week. Drucie Opitz, MD Taking Active Self           Med Note Terrilyn Saver Jan 07, 2023 11:24 AM) Saturdays  Evolocumab (REPATHA SURECLICK) [id number] MG/ML [name] [id number] No INJECT 1 PEN INTO THE SKIN EVERY 14 DAYS Burnell Blanks, MD Taking Active Self  exemestane (AROMASIN) 25 MG tablet [id number] No TAKE 1 TABLET BY MOUTH ONCE DAILY AFTER Clint Lipps, MD Taking Active Self  ezetimibe (ZETIA) 10 MG tablet [id number] No Take 1 tablet (10 mg total) by mouth daily. [name], PA-C 11/15/2022 Expired 01/07/23 2359 Self  ferrous sulfate 325 (65 FE) MG tablet [id number] No Take 1 tablet (325 mg total) by mouth every other day. [name], MD Taking Active Self  Discontinued 05/09/20 1429   Fluticasone-Umeclidin-Vilant (TRELEGY ELLIPTA) 100-62.5-25 MCG/ACT AEPB [id number] No INHALE 1 PUFF ONCE DAILY Strength: 100-62.5-25 MCG/ACT Lacinda Axon, MD Taking Active Self  furosemide (LASIX) 40 MG tablet [id number] No Take 1 tablet (40 mg total) by mouth as needed for fluid or edema. Dorethea Clan, DO Taking Active Self  gabapentin  (NEURONTIN) 300 MG capsule [id number]  TAKE 2 CAPSULES BY MOUTH IN THE MORNING AND TAKE 3 CAPSULES AT BEDTIME [name], MD  Active   glucose blood (ACCU-CHEK GUIDE) test strip [id number] No Check blood sugar 2 times per day [name], MD Taking Active Self  hydrocortisone cream 1 % [id number] No Apply 1 Application topically daily as needed for itching. [provider] Taking Active Self  LANTUS SOLOSTAR 100 UNIT/ML Solostar Pen [id number] No Inject 30 Units into the skin at bedtime. [provider] Taking Active Self  losartan-hydrochlorothiazide (HYZAAR) 100-25 MG tablet [id number] No Take 1 tablet by mouth daily. [name], MD 01/12/2023 Expired 01/13/23 2359 Self  metFORMIN (GLUCOPHAGE-XR) 500 MG 24 hr tablet [id number] No Take 1 tablet (500 mg total) by mouth 2 (two) times daily. [name], MD Taking Active Self  metoprolol succinate (TOPROL-XL) 50 MG 24 hr tablet [id number] No TAKE 1 TABLET BY MOUTH ONCE DAILY WITH MEALS OR  IMMEDIATELY  FOLLOWING  A  MEAL [name], MD Taking Active Self  montelukast (SINGULAIR) 10 MG tablet [id number] No Take 1 tablet (10 mg total) by mouth at bedtime. [name], MD Taking Active Self  nitrofurantoin, macrocrystal-monohydrate, (MACROBID) 100 MG capsule [id number] No Take 1 capsule (100 mg total) by mouth daily. Stoneking, Reece Leader.,  MD Taking Active Self  phenazopyridine (PYRIDIUM) 200 MG tablet [id number] No Take 1 tablet (200 mg total) by mouth 3 (three) times daily as needed for pain. Stoneking, Reece Leader., MD Taking Active   senna-docusate (SENNA-TIME S) 8.6-50 MG tablet [id number] No Take 1 tablet by mouth at bedtime as needed for mild constipation. [name], MD Taking Active Self  traZODone (DESYREL) 50 MG tablet [id number]  Take 1-2 tablets (50-100 mg total) by mouth at bedtime. Salley Slaughter, NP  Active   VENTOLIN HFA 108 (90 Base) MCG/ACT inhaler [id number] No INHALE 2 PUFFS BY MOUTH EVERY  6 HOURS AS NEEDED FOR WHEEZING FOR SHORTNESS OF Loyal Gambler, MD Taking Active Self            Patient Active Problem List   Diagnosis Date Noted   History of UTI 12/05/2022   Sepsis due to Escherichia coli with encephalopathy and septic shock ([place]) 11/17/2022   COPD exacerbation ([place]) 11/16/2022   Diabetic neuropathy ([place]) 08/29/2022   Vaginal lesion 08/29/2022   PTSD (post-traumatic stress disorder) 06/24/2022   GAD (generalized anxiety disorder) 06/24/2022   Major depressive disorder, recurrent episode, moderate ([place]) 06/24/2022   Urinary incontinence 06/20/2022   Intertrigo 06/20/2022   Arthritis of left shoulder region    S/P reverse total shoulder arthroplasty, 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   Healthcare maintenance 11/17/2021   Spondylolisthesis, lumbar region 02/11/2021    Class: Acute   Malignant neoplasm of upper-outer quadrant of right breast in female, estrogen receptor positive ([place]) 06/10/2018   Restless leg syndrome 02/04/2018   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   Depression 08/06/2015   Hypertension associated with diabetes ([place])    Aortic stenosis    Chronic diastolic congestive heart failure ([place])    COPD (chronic obstructive pulmonary disease) ([place]) 09/21/2013   Obstructive sleep apnea 09/21/2013   Nephrolithiasis 09/21/2013   Nocturnal hypoxemia 09/12/2013   Hyperlipidemia 11/30/2012   History of pulmonary embolus (PE) 07/09/2012   Normocytic anemia 02/08/2012   Type 2 diabetes mellitus without complication, without long-term current use of insulin ([place]) 02/08/2012   Asthma 02/08/2012   GERD (gastroesophageal reflux disease)    CAD (coronary artery disease) 12/08/2011    Conditions to be addressed/monitored per PCP order:  Depression  Care Plan : LCSW Plan of Care  Updates made by Greg Cutter, LCSW since 02/09/2023 12:00 AM     Problem: Depression Identification (Depression)      Long-Range Goal: Depressive Symptoms  Identified   Start Date: 04/17/2022  Recent Progress: On track  Note:   Long-Range Goal: I want to start mental health treatment    Start Date: 04/17/2022  Priority: High  Note:   Priority: High   Timeframe:  Long-Range Goal Priority:  High Start Date:   04/17/22                Expected End Date:  ongoing                 Follow Up Date--03/04/23 at 245 pm  - check out counseling - keep 90 percent of counseling appointments - schedule counseling appointment    Why is this important?             Beating depression may take some time.            If you don't feel better right away, don't give up on your treatment plan.     Current barriers:   Chronic Mental Health needs related to depression,  stress and anxiety. Patient requires Support, Education, Resources, Referrals, Advocacy, and Care Coordination, in order to meet Unmet Mental Health Needs. Patient will implement clinical interventions discussed today to decrease symptoms of depression and increase knowledge and/or ability of: coping skills. Mental Health Concerns and Social Isolation Patient lacks knowledge of available community counseling agencies and resources.   Clinical Goal(s): verbalize understanding of plan for management of Anxiety, Depression, and Stress and demonstrate a reduction in symptoms. Patient will connect with a provider for ongoing mental health treatment, increase coping skills, healthy habits, self-management skills, and stress reduction        Clinical Interventions:  Assessed patient's previous and current treatment, coping skills, support system and barriers to care. Patient provided history. Patient and spouse lived in a shared home with two other adults. Patient reports that this is a current and stable living situation.  Verbalization of feelings encouraged, motivational interviewing employed Emotional support provided, positive coping strategies explored Self care/establishing healthy boundaries  emphasized Patient reports that she takes medication for anxiety and depression. She reports that this has alleviated some of symptoms but not all. She reports that she still has crying spells. She needs help with finding psychiatry and counseling. She was successful in identifying triggers to anxiety and depression symptoms, in addition, to healthy coping skills.  Patient reports significant worsening anxiety and depression impacting her ability to function appropriately and carry out daily task. Patient receives strong support from spouse but he does not have anxiety or depression and is unable to relate to her and her daily mental health struggles.  Patient is agreeable to referral to [facility] for counseling and psychiatry. [facility] LCSW made referral on 04/17/22. Patient will call [facility] tomorrow on 04/18/22 to schedule counseling and psychiatry appointments. Email sent to her with [facility]'s contact information.  LCSW provided education on relaxation techniques such as meditation, deep breathing, massage, grounding exercises or yoga that can activate the body's relaxation response and ease symptoms of stress and anxiety. LCSW ask that when pt is struggling with difficult emotions and racing thoughts that they start this relaxation response process. LCSW provided extensive education on healthy coping skills for anxiety. SW used active and reflective listening, validated patient's feelings/concerns, and provided emotional support. Patient will work on implementing appropriate self-care habits into their daily routine such as: staying positive, writing a gratitude list, drinking water, staying active around the house, taking their medications as prescribed, combating negative thoughts or emotions and staying connected with their family and friends. Positive reinforcement provided for this decision to work on this. Patient was receptive to anxiety and depression management coping skill education. She will try to increase  her socialization over the next 90 days to decrease isolation.  Patient has problems with sleep disturbance. LCSW provided education on healthy sleep hygiene and what that looks like. LCSW encouraged patient to implement a night time routine into her schedule that works best for her and that she is able to maintain. Advised patient to implement deep breathing/grounding/meditation/self-care exercises into her nightly routine to combat racing thoughts at night. LCSW encouraged patient to wake up at the same time each day, make their sleeping environment comfortable, exercise when able, to limit naps and to not eat or drink anything right before bed.   Motivational Interviewing employed Depression screen reviewed  PHQ2/ PHQ9 completed Mindfulness or Relaxation training provided Active listening / Reflection utilized  Advance Care and HCPOA education provided Emotional Support Provided Problem [place] strategies reviewed Provided psychoeducation for  mental health needs  Provided brief CBT  Reviewed mental health medications and discussed importance of compliance:  Quality of sleep assessed & Sleep Hygiene techniques promoted  Participation in counseling encouraged  Verbalization of feelings encouraged  Suicidal Ideation/Homicidal Ideation assessed: Patient denies SI/HI  Review resources, discussed options and provided patient information about  [place] care team collaboration (see longitudinal plan of care) UPDATE 05/21/22- Patient was successfully set up with counseling at [facility] but was not set up with psychiatry. She is in need of both services. Patient has an upcoming counseling session at [facility] on 06/24/22. [facility] LCSW completed joint call with patient to [facility] but was unable to reach anyone. [facility] LCSW left a voice message asking for them to return call to patient to get her scheduled with psychiatry as [facility] LCSW ask for both psychiatry and counseling in  referral. [facility] LCSW ask if they needed an additional referral to please let [facility] LCSW know so this can be completed. [facility] LCSW sent patient an email with [facility]'s contact information including their address as well as a list of coping skills for depression and anxiety. Patient's spouse has multiple health conditions and tells patient that he "wishes to die" often which concerns her. [facility] LCSW provided emotional support and brief coping skill education on stress management.  UPDATE 06/12/22- Patient successfully contacted [facility] and set up appointments for both psychiatry and counseling. Patient is eager to start treatment. Brief self-care education provided to patient. Patient is agreeable to contact [facility] LCSW directly if needed. [facility] LCSW will make one last follow up on call to ensure that patient was successfully connected to a long term mental health provider. Patient denies any current crises or concerns.  UPDATE 07/18/22- Patient has not been set up with counseling at [facility] and she has completed her first counseling session but she has not been set up with psychiatry still. She has made several calls to agency but has been unsuccessful in reaching them. Patient has an in person appointment at [facility] on 07/30/22 and she will ask them to enroll her in psychiatry at that time. [facility] LCSW will follow up and continue to support patient as needed. UPDATE 08/01/22- Patient reports that she is doing extremely well and that her mood has lifted over the last few weeks. She reports that her first counselor appointment well very well and she was able to build rapport with him. She will see her counselor 2X per month. Her next appointment for therapy is on 08/20/22. Patient has her initial appointment with psychiatry is on the 08/12/22. UPDATE- Patient successfully completed her psychiatry appointment and has a follow up scheduled in two months. However, patient reports having ongoing issues with sleep even with the medication and reports  needing this medication to be adjusted. Pt will discuss this concern with her therapist on 09/09/22 but was encouraged to call [facility] before then if needed to address this issue. [facility] LCSW will follow up in two weeks. UPDATE- Patient expressed to her counselor about her ongoing issues with her sleep and he informed her to tell her psychiatrist. [facility] LCSW sent message to her psychiatrist with this medication dosage adjustment request. [facility] LCSW educated patient on healthy sleep hygiene. Patient denies any current crises. Patient is agreeable to contact [facility] herself to inquire about her ongoing sleep issues. UPDATE 10/17- Patient continues to struggle with ongoing sleep issues and has decided not to call her psychiatrist but wait until her scheduled appointment on 11/06/22 to address  these concerns. [facility] LCSW suggested against this as patient will not been seen in over 4 weeks but patient reports that she can manage until then. Healthy sleep and hygiene techniques were provided to patient. Patient will continue to try as much healthy self-care into her daily routine to combat these negative symptoms. [facility] LCSW will follow up the day after her psychiatry appointment. UPDATE- Patient met with counselor and psychiatrist yesterday. She reports that there was an adjustment in her medicine in order to improve her sleep. Patient reports that her sleep has somewhat improved. She reports that she was able to sign up successfully for Social Security. 01/09/23- Patient contacted [facility] Management department and left a message requesting a return call from [facility] LCSW. [facility] LCSW completed call and was informed that patient had called [facility] yesterday and was on hold for a hour. She states that she is really needing to get a new medicine for sleep. The nurses in the back said that she would need to arrange a telephone call for Dr. Alean Rinne to reach out to assist with this bc she hasn't seen Dr. Alean Rinne in 3 months and will not until next month.  Patient as left on hold and eventually hung up. In basket message was sent to staff and psychiatrist at [facility] and these messages were read and hopefully appointment will be scheduled. 01/19/23 update- Pt spoke with her psychiatrist and was able to adjust her medications to see if it could improve her sleep routine. Patient reports that she had a very emotional session with her counselor today. Patient continues to make great strides towards improving her self-worth. Positive reinforcement provided, 02/09/23 update- Patient reports being sick right now. She reports that her depression increases whenever she is sick and she was given extensive self-care education pertaining to recovery. Patient reports that her sleep as improved with the newly adjusted medication that her psychiatrist prescribed.  Patient Goals/Self-Care Activities: Over the next 120 days Attend scheduled medical appointments Utilize healthy coping skills and supportive resources discussed Contact PCP with any questions or concerns Keep 90 percent of counseling appointments Call your insurance provider for more information about your Enhanced Benefits  Check out counseling resources provided  Begin personal counseling with LCSW, to reduce and manage symptoms of Depression and Stress, until well-established with mental health provider Accept all calls from representative with [facility] in an effort to establish ongoing mental health counseling and supportive services. Incorporate into daily practice - relaxation techniques, deep breathing exercises, and mindfulness meditation strategies. Talk about feelings with friends, family members, spiritual advisor, etc. Contact LCSW directly [phone]), if you have questions, need assistance, or if additional social work needs are identified between now and our next scheduled telephone outreach call. Call 988 for mental health hotline/crisis line if needed (24/7 available) Try techniques to reduce  symptoms of anxiety/negative thinking (deep breathing, distraction, positive self talk, etc)  - develop a personal safety plan - develop a plan to deal with triggers like holidays, anniversaries - exercise at least 2 to 3 times per week - have a plan for how to handle bad days - journal feelings and what helps to feel better or worse - spend time or talk with others at least 2 to 3 times per week - watch for early signs of feeling worse - begin personal counseling - call and visit an old friend - check out volunteer opportunities - join a support group - laugh; watch a funny movie or comedian - learn and use visualization  or guided imagery - perform a random act of kindness - practice relaxation or meditation daily - start or continue a personal journal - practice positive thinking and self-talk -continue with compliance of taking medication        05/21/2022   12:53 PM 04/17/2022    3:17 PM 11/14/2021    4:02 PM 08/21/2021    4:33 PM 12/10/2020    4:00 PM  Depression screen PHQ 2/9  Decreased Interest 2 2 1 $ 0 1  Down, Depressed, Hopeless 2 2 1 $ 0 0  PHQ - 2 Score 4 4 2 $ 0 1  Altered sleeping 3 3 3   1  $ Tired, decreased energy 3 3 1   1  $ Change in appetite 3 3 1   1  $ Feeling bad or failure about yourself  0 1 0   0  Trouble concentrating 0 0 0   0  Moving slowly or fidgety/restless 0   0   0  Suicidal thoughts 0 0 0   0  PHQ-9 Score 13 14 7   4  $ Difficult doing work/chores Somewhat difficult Somewhat difficult Not difficult at all   Not difficult at all        24- Hour Availability:    [facility]  [address] [place], [place] [facility] [phone]   [facility] [employer] [phone]   [place]  [phone]    [place]  [phone] (after hours)   Therapeutic Alternative/Mobile Crisis   [phone]   [place] National Suicide Hotline  ([phone] (TALK)  OR 988   Call 911 or go to emergency room   [facility]  [phone]);  [place] and [employer]  [phone]); [place], [place], [place], [place], [place], [place], [place]           Follow up:  Patient agrees to Care Plan and Follow-up.  Plan: The Managed Medicaid care management team will reach out to the patient again over the next 30 days.  Date/time of next scheduled Social Work care management/care coordination outreach:  03/04/23 at 245.   [name], BSW, MSW, [employer] Managed Medicaid LCSW [email or web address] Phone: [phone]

## 2023-02-09 NOTE — Telephone Encounter (Signed)
Patient called in c/o 2 day hx of cough, productive of white sputum, sore throat, and "stuffed head." Denies fever, chills, body aches, SHOB. Has Oxygen at home, but doesn't feel need to use it. No openings till 2/14. Appt given for 2/14 at 2:45. In the meantime, she will try guaifenesin/dextromethorphan, and Tylenol. Advised she drink plenty of fluids, especially water. She is advised to head to ED/UC if she develops SHOB. She will cancel appt if she feels better by then.

## 2023-02-11 ENCOUNTER — Encounter: Payer: Medicaid Other | Admitting: Internal Medicine

## 2023-02-12 NOTE — Progress Notes (Signed)
Internal Medicine Clinic Attending  Case discussed with Dr. Collene Gobble  At the time of the visit.  We reviewed the resident's history and exam and pertinent patient test results.  I agree with the assessment, diagnosis, and plan of care including medication changes documented in the resident's note.

## 2023-02-16 ENCOUNTER — Ambulatory Visit (INDEPENDENT_AMBULATORY_CARE_PROVIDER_SITE_OTHER): Payer: Medicaid Other | Admitting: Licensed Clinical Social Worker

## 2023-02-16 ENCOUNTER — Ambulatory Visit (HOSPITAL_COMMUNITY): Payer: Medicaid Other | Admitting: Licensed Clinical Social Worker

## 2023-02-16 DIAGNOSIS — F4310 Post-traumatic stress disorder, unspecified: Secondary | ICD-10-CM

## 2023-02-16 DIAGNOSIS — F331 Major depressive disorder, recurrent, moderate: Secondary | ICD-10-CM

## 2023-02-16 DIAGNOSIS — F411 Generalized anxiety disorder: Secondary | ICD-10-CM
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
## 2023-02-18 ENCOUNTER — Ambulatory Visit (HOSPITAL_COMMUNITY): Payer: Medicaid Other | Attending: Nurse Practitioner

## 2023-02-18 DIAGNOSIS — Z952 Presence of prosthetic heart valve: Secondary | ICD-10-CM | POA: Insufficient documentation

## 2023-02-18 DIAGNOSIS — I2510 Atherosclerotic heart disease of native coronary artery without angina pectoris: Secondary | ICD-10-CM | POA: Diagnosis not present

## 2023-02-18 DIAGNOSIS — E785 Hyperlipidemia, unspecified: Secondary | ICD-10-CM | POA: Insufficient documentation

## 2023-02-18 DIAGNOSIS — Z01818 Encounter for other preprocedural examination: Secondary | ICD-10-CM | POA: Insufficient documentation

## 2023-02-18 DIAGNOSIS — I5030 Unspecified diastolic (congestive) heart failure: Secondary | ICD-10-CM | POA: Diagnosis not present

## 2023-02-18 DIAGNOSIS — I342 Nonrheumatic mitral (valve) stenosis: Secondary | ICD-10-CM

## 2023-02-18 DIAGNOSIS — I517 Cardiomegaly: Secondary | ICD-10-CM

## 2023-02-18 DIAGNOSIS — R0609 Other forms of dyspnea: Secondary | ICD-10-CM | POA: Diagnosis not present

## 2023-02-18 LAB — ECHOCARDIOGRAM COMPLETE
AR max vel: 1.77 cm2
AV Area VTI: 1.71 cm2
AV Area mean vel: 1.73 cm2
AV Mean grad: 11 mmHg
AV Peak grad: 19.2 mmHg
Ao pk vel: 2.19 m/s
Area-P 1/2: 3.17 cm2
MV VTI: 1.9 cm2
S' Lateral: 2.9 cm

## 2023-02-19 ENCOUNTER — Telehealth: Payer: Medicaid Other | Admitting: Physician Assistant

## 2023-02-19 DIAGNOSIS — R112 Nausea with vomiting, unspecified: Secondary | ICD-10-CM | POA: Diagnosis not present

## 2023-02-19 DIAGNOSIS — Z79899 Other long term (current) drug therapy: Secondary | ICD-10-CM

## 2023-02-19 MED ORDER — ONDANSETRON 4 MG PO TBDP: 4.0000 mg | ORAL_TABLET | Freq: Three times a day (TID) | ORAL | 0 refills | Status: DC | PRN

## 2023-02-19 NOTE — Progress Notes (Signed)
E-Visit for Vomiting  We are sorry that you are not feeling well. Here is how we plan to help!  Based on what you have shared with me it looks like you have vomiting that may be related to medication or combination of medications giving your listed concerns. Vomiting is the forceful emptying of a portion of the stomach's content through the mouth.  Although nausea and vomiting can make you feel miserable, it's important to remember that these are not diseases, but rather symptoms of an underlying illness.  When we treat short term symptoms, we always caution that any symptoms that persist should be fully evaluated in a medical office.  I have prescribed a medication that will help alleviate your symptoms and allow you to stay hydrated:  Zofran 4 mg 1 tablet every 8 hours as needed for nausea and vomiting  You need to call your primary care office to schedule an appointment so you and your PCP can review your medications and see if there is a contributing factor where medications need to be adjusted.   HOME CARE: Drink clear liquids.  This is very important! Dehydration (the lack of fluid) can lead to a serious complication.  Start off with 1 tablespoon every 5 minutes for 8 hours. You may begin eating bland foods after 8 hours without vomiting.  Start with saltine crackers, white bread, rice, mashed potatoes, applesauce. After 48 hours on a bland diet, you may resume a normal diet. Try to go to sleep.  Sleep often empties the stomach and relieves the need to vomit.  GET HELP RIGHT AWAY IF:  Your symptoms do not improve or worsen within 2 days after treatment. You have a fever for over 3 days. You cannot keep down fluids after trying the medication.  MAKE SURE YOU:  Understand these instructions. Will watch your condition. Will get help right away if you are not doing well or get worse.   Thank you for choosing an e-visit.  Your e-visit answers were reviewed by a board certified  advanced clinical practitioner to complete your personal care plan. Depending upon the condition, your plan could have included both over the counter or prescription medications.  Please review your pharmacy choice. Make sure the pharmacy is open so you can pick up prescription now. If there is a problem, you may contact your provider through [employer] and have the prescription routed to another pharmacy.  Your safety is important to [place]. If you have drug allergies check your prescription carefully.   For the next 24 hours you can use MyChart to ask questions about today's visit, request a non-urgent call back, or ask for a work or school excuse. You will get an email in the next two days asking about your experience. I hope that your e-visit has been valuable and will speed your recovery.

## 2023-02-19 NOTE — Progress Notes (Signed)
I have spent 5 minutes in review of e-visit questionnaire, review and updating patient chart, medical decision making and response to patient.   Lashika Erker Cody Verlin Duke, PA-C    

## 2023-02-23 ENCOUNTER — Telehealth: Payer: Self-pay | Admitting: Nurse Practitioner

## 2023-02-23 NOTE — Telephone Encounter (Signed)
I did not need this encounter. °

## 2023-02-25 ENCOUNTER — Ambulatory Visit (INDEPENDENT_AMBULATORY_CARE_PROVIDER_SITE_OTHER): Payer: Medicaid Other | Admitting: Urology

## 2023-02-25 ENCOUNTER — Encounter: Payer: Self-pay | Admitting: Urology

## 2023-02-25 VITALS — BP 118/70 | HR 85 | Ht 63.0 in | Wt 304.0 lb

## 2023-02-25 DIAGNOSIS — R1011 Right upper quadrant pain: Secondary | ICD-10-CM | POA: Diagnosis not present

## 2023-02-25 DIAGNOSIS — N200 Calculus of kidney: Secondary | ICD-10-CM

## 2023-02-25 DIAGNOSIS — Z87442 Personal history of urinary calculi: Secondary | ICD-10-CM

## 2023-02-25 DIAGNOSIS — Z87440 Personal history of urinary (tract) infections: Secondary | ICD-10-CM | POA: Diagnosis not present

## 2023-02-25 LAB — URINALYSIS
Bilirubin, UA: NEGATIVE
Blood, UA: NEGATIVE
Glucose, UA: NEGATIVE mg/dL
Ketones, POC UA: NEGATIVE mg/dL
Nitrite, UA: NEGATIVE
Protein Ur, POC: NEGATIVE mg/dL
Spec Grav, UA: 1.015 (ref 1.010–1.025)
Urobilinogen, UA: 1 E.U./dL
pH, UA: 5 (ref 5.0–8.0)

## 2023-02-25 NOTE — Progress Notes (Signed)
Assessment: 1. Nephrolithiasis   2. History of UTI   3. RUQ pain     Plan: Stone analysis sent Continue stone prevention  CT renal stone protocol for evaluation of right upper abdominal pain Will call with results and to arrange next visit  Chief Complaint:  Chief Complaint  Patient presents with   Nephrolithiasis    History of Present Illness:  [name] is a 62 y.o. female who is seen for continued evaluation of right proximal ureteral stone and UTI.  She was seen by Dr. Cain Sieve at [facility] on 11/16/2022 with a proximal right ureteral calculus and UTI with sepsis.  CT imaging from 11/16/2022 showed an obstructing 4 mm calculus at the right UPJ with associated hydronephrosis and punctate nonobstructing left nephrolithiasis.  She underwent cystoscopy with placement of a right ureteral stent on 11/16/2022.  Urine culture grew >100 K E. coli.  Blood cultures were also positive for E. coli.  She was treated with IV antibiotics and discharged on [place]. Urine culture from 12/05/22 grew >100K E. Coli.  Treated with Cefdinir and started on daily Macrobid. She underwent right ureteroscopic laser lithotripsy and exchange of a right ureteral stent on 01/13/2023.  The stone was completely fragmented with several larger fragments removed with a stone basket. Her stent was removed accidentally while using the bathroom.  At her visit in January 2024, she did not report any flank pain following stent removal.  She continued on daily Macrobid.  No dysuria or gross hematuria.  She returns today for follow-up.  She reports a 3-4-day history of some right upper quadrant pain with radiation to her right back area.  She reports this pain is a 3-4 10 in intensity and is intermittent in nature.  No fevers or chills.  No dysuria or gross hematuria.  She is not aware of passing any additional stone fragments since her last visit.  She continues with urgency and occasional urge  incontinence.  Portions of the above documentation were copied from a prior visit for review purposes only.   Past Medical History:  Past Medical History:  Diagnosis Date   Anemia    Anxiety    Aortic valve stenosis, severe    Arthritis    PAIN AND SEVERE OA LEFT KNEE ; S/P RIGHT TKA ON 02/03/12; HAS LOWER BACK PAIN-UNABLE TO STAND MORE THAN 10 MIN; ARTHRITIS "ALL OVER"   Asthma    Blood transfusion    [facility]   Breast cancer in female [facility])    Right   CAD (coronary artery disease)    Cath 2010 with DES x 1 RCA-- PT'S CARDIOLOGIST IS DR. MCALHANY   Chronic diastolic congestive heart failure (HCC)    COPD (chronic obstructive pulmonary disease) (HCC)    Depression    Diabetes mellitus DIAGNOSED IN2010   Dyspnea    with much ambulation   Eczema    on back   Headache    migraines younger- rare now 02/07/21   Heart murmur    no current problems   History of hiatal hernia    History of kidney stones    passed or blasted   Hyperlipidemia    Hypertension    Morbid obesity with body mass index of 60.0-69.9 in adult [facility])    Myocardial infarction ([place])    PT THINKS SHE WAS DX WITH MI AT THE TIME OF HEART STENTING   Neuromuscular disorder ([place])    bilateral arm/hands   Oxygen dependent  uses 3L oxygen night/prn   Personal history of radiation therapy    Pneumonia    Pulmonary embolism ([place]) 02/08/2012   S/P RT TOTAL KNEE ON 02/03/12--ON 02/08/12--DEVELOPED ACUTE SOB AND CHEST PAIN--AND DIAGNOSED WITH  PULMONARY EMBOLUS AND PNEUMONIA   Restless leg syndrome    Sleep apnea    uses 3 liters O2 at night as needed   Uterine fibroid    NO PROBLEMS AT PRESENT FROM THE FIBROIDS-STATES SHE IS POST MENOPAUSAL-LAST MENSES 2010 EXCEPT FOR EPISODE THIS YR OF BLEEDING RELATED TO FIBROIDS.   Weakness    BOTH HANDS - S/P BILATERAL CARPAL TUNNEL RELEASE--BUT STILL HAS WEAKNESS--OFTEN DROPS THINGS    Past Surgical History:  Past Surgical History:  Procedure Laterality Date   BACK  SURGERY  02/11/2021   Dr Louanne Skye   BREAST BIOPSY Right 06/04/2018   BREAST LUMPECTOMY Right 06/2018   BREAST LUMPECTOMY WITH RADIOACTIVE SEED AND SENTINEL LYMPH NODE BIOPSY Right 07/19/2018   Procedure: BREAST LUMPECTOMY WITH RADIOACTIVE SEED AND SENTINEL LYMPH NODE BIOPSY;  Surgeon: Alphonsa Overall, MD;  Location: [place];  Service: General;  Laterality: Right;   CARDIAC CATHETERIZATION     CARDIAC VALVE REPLACEMENT     2017   CARPAL TUNNEL RELEASE     Bilateral   CHOLECYSTECTOMY     COLONOSCOPY     CORONARY ANGIOPLASTY     2010 has stent in place   CYSTOSCOPY W/ RETROGRADES Right 09/21/2013   Procedure: CYSTOSCOPY WITH RIGHT RETROGRADE PYELOGRAM RIGHT DOUBLE J STENT ;  Surgeon: Fredricka Bonine, MD;  [facility];  Service: Urology;  Laterality: Right;   CYSTOSCOPY W/ URETERAL STENT PLACEMENT Right 11/16/2022   Procedure: CYSTOSCOPY WITH RETROGRADE PYELOGRAM/URETERAL STENT PLACEMENT;  Surgeon: Vira Agar, MD;  Location: [place];  Service: Urology;  Laterality: Right;   CYSTOSCOPY WITH RETROGRADE PYELOGRAM, URETEROSCOPY AND STENT PLACEMENT Right 01/13/2023   Procedure: CYSTOSCOPY WITH RETROGRADE PYELOGRAM, URETEROSCOPY AND STENT EXCHANGE;  Surgeon: Primus Bravo., MD;  [facility];  Service: Urology;  Laterality: Right;   CYSTOSCOPY WITH URETEROSCOPY AND STENT PLACEMENT Right 10/25/2013   Procedure: CYSTOSCOPY RIGHT URETEROSCOPY HOLMIUM LASER LITHO AND STENT PLACEMENT;  Surgeon: Fredricka Bonine, MD;  [facility];  Service: Urology;  Laterality: Right;   HERNIA REPAIR  [id number]   umbilical ,   HOLMIUM LASER APPLICATION Right [id number]   Procedure: HOLMIUM LASER APPLICATION;  Surgeon: Primus Bravo., MD;  [facility];  Service: Urology;  Laterality: Right;   INTRAOPERATIVE TRANSESOPHAGEAL ECHOCARDIOGRAM N/A 12/12/2014   Procedure: INTRAOPERATIVE TRANSESOPHAGEAL ECHOCARDIOGRAM;  Surgeon: Burnell Blanks, MD;  Location: [place];  Service:  Cardiovascular;  Laterality: N/A;   JOINT REPLACEMENT     bil total knees   KNEE ARTHROPLASTY  02/03/2012   Procedure: COMPUTER ASSISTED TOTAL KNEE ARTHROPLASTY;  Surgeon: Mcarthur Rossetti, MD;  Location: [place];  Service: Orthopedics;  Laterality: Right;  Right total knee arthroplasty   LEFT AND RIGHT HEART CATHETERIZATION WITH CORONARY ANGIOGRAM N/A 03/17/2013   Procedure: LEFT AND RIGHT HEART CATHETERIZATION WITH CORONARY ANGIOGRAM;  Surgeon: Burnell Blanks, MD;  Location: [facility] C[facility] LAB;  Service: Cardiovascular;  Laterality: N/A;   LEFT AND RIGHT HEART CATHETERIZATION WITH CORONARY/GRAFT ANGIOGRAM N/A 09/14/2014   Procedure: LEFT AND RIGHT HEART CATHETERIZATION WITH Beatrix Fetters;  Surgeon: Burnell Blanks, MD;  Location: [facility] CATH LAB;  Service: Cardiovascular;  Laterality: N/A;   REVERSE SHOULDER ARTHROPLASTY Left 03/06/2022   Procedure: LEFT SHOULDER REPLACEMENT APPLICATION OF WOUND VAC;  Surgeon: [name], MD;  Location: [place];  Service: Orthopedics;  Laterality: Left;   TEE WITHOUT CARDIOVERSION N/A 03/14/2013   Procedure: TRANSESOPHAGEAL ECHOCARDIOGRAM (TEE);  Surgeon: Lelon Perla, MD;  Location: [facility] ENDOSCOPY;  Service: Cardiovascular;  Laterality: N/A;   TEE WITHOUT CARDIOVERSION N/A 11/14/2014   Procedure: TRANSESOPHAGEAL ECHOCARDIOGRAM (TEE);  Surgeon: Lelon Perla, MD;  Location: [facility] ENDOSCOPY;  Service: Cardiovascular;  Laterality: N/A;   TONSILLECTOMY     maybe as a child- does not know   TOTAL KNEE ARTHROPLASTY  09/10/2012   Procedure: TOTAL KNEE ARTHROPLASTY;  Surgeon: Mcarthur Rossetti, MD;  [facility];  Service: Orthopedics;  Laterality: Left;   TOTAL KNEE REVISION Right 07/15/2013   Procedure: REVISION ARTHROPLASTY RIGHT KNEE;  Surgeon: Mcarthur Rossetti, MD;  [facility];  Service: Orthopedics;  Laterality: Right;   TRANSCATHETER AORTIC VALVE REPLACEMENT, TRANSFEMORAL N/A 12/12/2014   Procedure:  TRANSCATHETER AORTIC VALVE REPLACEMENT, TRANSFEMORAL;  Surgeon: Burnell Blanks, MD;  Location: [place];  Service: Cardiovascular;  Laterality: N/A;   TRIGGER FINGER RELEASE  09/10/2012   Procedure: RELEASE TRIGGER FINGER/A-1 PULLEY;  Surgeon: Mcarthur Rossetti, MD;  [facility];  Service: Orthopedics;  Laterality: Right;  Right Ring Finger   TUBAL LIGATION      Allergies:  No Known Allergies  Family History:  Family History  Problem Relation Age of Onset   Breast cancer Mother        stage IV at diagnosis   Emphysema Mother        smoked   Heart disease Mother    COPD Father        smoked   Asthma Father    Heart disease Father    Cancer Brother        Sinus    Social History:  Social History   Tobacco Use   Smoking status: Former    Packs/day: 1.50    Years: 30.00    Total pack years: 45.00    Types: Cigarettes    Quit date: 12/29/2000    Years since quitting: 22.1   Smokeless tobacco: Never  Vaping Use   Vaping Use: Never used  Substance Use Topics   Alcohol use: Not Currently   Drug use: Not Currently    Types: Marijuana    Comment: CBD oils through vape shops    ROS: Constitutional:  Negative for fever, chills, weight loss CV: Negative for chest pain, previous MI, hypertension Respiratory:  Negative for shortness of breath, wheezing, sleep apnea, frequent cough GI:  Negative for nausea, vomiting, bloody stool, GERD  Physical exam: BP 118/70   Pulse 85   Ht '5\' 3"'$  (1.6 m)   Wt (!) 304 lb (137.9 kg)   LMP 02/03/2012   BMI 53.85 kg/m  GENERAL APPEARANCE:  Well appearing, well developed, well nourished, NAD HEENT:  Atraumatic, normocephalic, oropharynx clear NECK:  Supple without lymphadenopathy or thyromegaly ABDOMEN:  Soft, non-tender, no masses EXTREMITIES:  Moves all extremities well, without clubbing, cyanosis, or edema NEUROLOGIC:  Alert and oriented x 3, normal gait, CN II-XII grossly intact MENTAL STATUS:  appropriate BACK:   Non-tender to palpation, No CVAT SKIN:  Warm, dry, and intact  Results: U/A dipstick: trace LE

## 2023-02-27 DIAGNOSIS — Z419 Encounter for procedure for purposes other than remedying health state, unspecified: Secondary | ICD-10-CM | POA: Diagnosis not present

## 2023-03-04 ENCOUNTER — Other Ambulatory Visit: Payer: Medicaid Other | Admitting: Licensed Clinical Social Worker

## 2023-03-04 ENCOUNTER — Telehealth: Payer: Self-pay | Admitting: Nurse Practitioner

## 2023-03-04 LAB — CALCULI, WITH PHOTOGRAPH (CLINICAL LAB)
Calcium Oxalate Dihydrate: 20 %
Calcium Oxalate Monohydrate: 70 %
Hydroxyapatite: 10 %
Weight Calculi: 15 mg

## 2023-03-04 NOTE — Telephone Encounter (Signed)
Contacted insurance provider for peer to peer regarding patient's TTE. Was advised I was being transferred to the cardiac team. After 10 minute hold I hung up.  Transfer request # [id number]

## 2023-03-04 NOTE — Patient Instructions (Signed)
[name] ,   The [facility] Managed Care Team is available to provide assistance to you with your healthcare needs at no cost and as a benefit of your [facility] Health plan. I'm sorry I was unable to reach you today for our scheduled appointment. Our care guide will call you to reschedule our telephone appointment. Please call me at the number below. I am available to be of assistance to you regarding your healthcare needs. .   Thank you,   [name], BSW, MSW, LCSW Managed Medicaid LCSW [name] Phone: [phone]

## 2023-03-04 NOTE — Patient Outreach (Signed)
  Medicaid Managed Care   Unsuccessful Attempt Note   03/04/2023 Name: [name] MRN: [id number] DOB: 12-Apr-1961  Referred by: [name], MD Reason for referral : No chief complaint on file.   An unsuccessful telephone outreach was attempted today. The patient was referred to the case management team for assistance with care management and care coordination.    Follow Up Plan: A HIPAA compliant phone message was left for the patient providing contact information and requesting a return call.   [name], BSW, MSW, [employer] Managed Medicaid LCSW [email or web address] Phone: [phone]

## 2023-03-05 ENCOUNTER — Encounter: Payer: Self-pay | Admitting: Urology

## 2023-03-05 ENCOUNTER — Ambulatory Visit (INDEPENDENT_AMBULATORY_CARE_PROVIDER_SITE_OTHER): Payer: Medicaid Other | Admitting: Licensed Clinical Social Worker

## 2023-03-05 DIAGNOSIS — F411 Generalized anxiety disorder: Secondary | ICD-10-CM

## 2023-03-05 DIAGNOSIS — F4310 Post-traumatic stress disorder, unspecified: Secondary | ICD-10-CM | POA: Diagnosis not present

## 2023-03-05 NOTE — Telephone Encounter (Signed)
Regarding echocardiogram that was completed 02/18/23. Will you please notify patient that I have spoken with the company ([employer]? Dr. Ruffin Frederick) that reviews cases for the insurance provider and was advised that she will need to contact her insurance company to discuss billing for this procedure. They have deemed that she should have received pre-authorization prior to getting the test completed. I am also sending this information to billing.
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
## 2023-03-06 ENCOUNTER — Ambulatory Visit
Admission: RE | Admit: 2023-03-06 | Discharge: 2023-03-06 | Disposition: A | Discharge status: Home / Self Care | Payer: Medicaid Other | Source: Ambulatory Visit | Attending: Urology | Admitting: Urology

## 2023-03-06 DIAGNOSIS — K439 Ventral hernia without obstruction or gangrene: Secondary | ICD-10-CM | POA: Diagnosis not present

## 2023-03-06 DIAGNOSIS — N200 Calculus of kidney: Secondary | ICD-10-CM | POA: Diagnosis not present

## 2023-03-06 DIAGNOSIS — N289 Disorder of kidney and ureter, unspecified: Secondary | ICD-10-CM | POA: Diagnosis not present

## 2023-03-06 DIAGNOSIS — R1011 Right upper quadrant pain: Secondary | ICD-10-CM

## 2023-03-06 DIAGNOSIS — M4326 Fusion of spine, lumbar region: Secondary | ICD-10-CM | POA: Diagnosis not present

## 2023-03-09 ENCOUNTER — Telehealth: Payer: Self-pay | Admitting: Urology

## 2023-03-09 ENCOUNTER — Encounter: Payer: Self-pay | Admitting: Obstetrics and Gynecology

## 2023-03-09 ENCOUNTER — Other Ambulatory Visit: Payer: Medicaid Other | Admitting: Obstetrics and Gynecology

## 2023-03-09 ENCOUNTER — Ambulatory Visit (HOSPITAL_COMMUNITY): Payer: Medicaid Other | Admitting: Licensed Clinical Social Worker

## 2023-03-09 NOTE — Telephone Encounter (Signed)
Patient is calling back in regards to insurance and her echo test. She states that insurance states they need more info from the doctor for this to be paid.

## 2023-03-09 NOTE — Telephone Encounter (Signed)
Lvm per (DPR) regarding Michelle's recommendations that pt needs to call ins co. Which is the next step to get echo approved.

## 2023-03-09 NOTE — Patient Outreach (Signed)
Medicaid Managed Care   Nurse Care Manager Note  03/09/2023 Name:  [name] MRN:  [id number] DOB:  Jun 22, 1961  [name] is an 62 y.o. year old female who is a primary patient of [name] Dame, MD.  The [facility] Managed Care Coordination team was consulted for assistance with:    Chronic healthcare management needs,   HTN, DM, HF, asthma, COPD, OSA, arthritis, GERD, CAD, HLD, h/o breast cancer, RLS  [name] was given information about Medicaid Managed Care Coordination team services today. [name] Patient agreed to services and verbal consent obtained.  Engaged with patient by telephone for follow up visit in response to provider referral for case management and/or care coordination services.   Assessments/Interventions:  Review of past medical history, allergies, medications, health status, including review of consultants reports, laboratory and other test data, was performed as part of comprehensive evaluation and provision of chronic care management services.  SDOH (Social Determinants of Health) assessments and interventions performed: SDOH Interventions    Flowsheet Row Patient Outreach Telephone from 03/09/2023 in [place] Most recent reading at 03/09/2023 11:12 AM Patient Outreach Telephone from 02/09/2023 in [place] Most recent reading at 02/09/2023  3:21 PM Patient Outreach Telephone from 02/06/2023 in [place] Most recent reading at 02/06/2023 10:51 AM Patient Outreach Telephone from 01/19/2023 in [place] Most recent reading at 01/19/2023 11:26 AM Patient Outreach Telephone from 01/09/2023 in [place] Most recent reading at 01/09/2023  3:56 PM Patient Outreach Telephone from 01/09/2023 in [place] Most recent reading at 01/09/2023 10:40 AM  SDOH Interventions         Food Insecurity Interventions -- -- Intervention Not Indicated -- -- --  Housing Interventions -- -- Intervention Not Indicated -- -- --  Transportation Interventions Intervention Not Indicated -- -- -- -- --  Utilities Interventions Intervention Not Indicated -- -- -- -- --  Physical Activity Interventions -- -- -- -- Other (Comments)  [patient not able to engage in this level of exercise] --  Stress Interventions -- [employer], Provide Counseling -- [employer], Provide Counseling -- [employer], Provide Counseling  Social Connections Interventions -- -- -- -- Intervention Not Indicated --     Care Plan  No Known Allergies  Medications Reviewed Today     Reviewed by Gayla Medicus, RN (Registered Nurse) on 03/09/23 at [place] List Status: <None>   Medication Order Taking? Sig Documenting Provider Last Dose Status Informant  Accu-Chek FastClix Lancets MISC [id number] No Check blood sugar two times a day Lorella Nimrod, MD Taking Active Self  acetaminophen (TYLENOL) 500 MG tablet [id number] No Take 1,000 mg by mouth every 6 (six) hours as needed for moderate pain. [provider] Taking Active Self  albuterol (PROVENTIL) (2.5 MG/3ML) 0.083% nebulizer solution [id number] No USE 1 VIAL IN NEBULIZER EVERY 4 HOURS AS NEEDED FOR WHEEZING OR SHORTNESS OF BREATH Amponsah, Charisse March, MD Taking Active Self  amlodipine-atorvastatin (CADUET) 5-80 MG tablet [id number] No Take 1 tablet by mouth daily. Leigh Aurora, DO Taking Active Self  ASPIRIN ADULT LOW STRENGTH 81 MG EC tablet [id number] No TAKE 1 Tablet BY MOUTH ONCE DAILY Lorella Nimrod, MD Taking Active Self  B-D UF III MINI PEN NEEDLES 31G X 5 MM MISC [id number] No USE AS DIRECTED DAILY Angelica Pou, MD Taking Active Self  Blood  Glucose Monitoring Suppl (ACCU-CHEK GUIDE) w/Device KIT [id number] No 1 each by Does not apply route 2 (two) times daily. Lorella Nimrod,  MD Taking Active Self  buPROPion (WELLBUTRIN XL) 150 MG 24 hr tablet [id number] No Take 1 tablet (150 mg total) by mouth daily. Salley Slaughter, NP Taking Active   busPIRone (BUSPAR) 15 MG tablet [id number] No Take 1 tablet (15 mg total) by mouth 2 (two) times daily. Salley Slaughter, NP Taking Active   cholecalciferol (VITAMIN D3) 25 MCG (1000 UNIT) tablet [id number] No Take 1,000 Units by mouth daily. [provider] Taking Active Self  clopidogrel (PLAVIX) 75 MG tablet [id number] No TAKE 1 TABLET BY MOUTH IN THE [name], MD Taking Active Self  Continuous Blood Gluc Sensor (FREESTYLE LIBRE 3 SENSOR) [place] [id number] No Place 1 sensor on the skin every 14 days. Use to check glucose continuously [name] Dame, MD Taking Active Self  Dulaglutide (TRULICITY) 4.5 0000000 SOPN [id number] No Inject 4.5 mg as directed once a week. Drucie Opitz, MD Taking Active Self           Med Note Terrilyn Saver Jan 07, 2023 11:24 AM) Saturdays  Evolocumab (REPATHA SURECLICK) [id number] MG/ML [name] [id number] No INJECT 1 PEN INTO THE SKIN EVERY 14 DAYS Burnell Blanks, MD Taking Active Self  exemestane (AROMASIN) 25 MG tablet [id number] No TAKE 1 TABLET BY MOUTH ONCE DAILY AFTER Clint Lipps, MD Taking Active Self  ezetimibe (ZETIA) 10 MG tablet [id number] No Take 1 tablet (10 mg total) by mouth daily. [name], PA-C 11/15/2022 Expired 01/07/23 2359 Self  ferrous sulfate 325 (65 FE) MG tablet [id number] No Take 1 tablet (325 mg total) by mouth every other day. [name] Axe, MD Taking Active Self  Discontinued 05/09/20 1429   Fluticasone-Umeclidin-Vilant (TRELEGY ELLIPTA) 100-62.5-25 MCG/ACT AEPB [id number] No INHALE 1 PUFF ONCE DAILY Strength: 100-62.5-25 MCG/ACT Lacinda Axon, MD Taking Active Self  furosemide (LASIX) 40 MG tablet [id number] No Take 1 tablet (40 mg total) by mouth as needed for fluid or edema. Dorethea Clan, DO Taking Active Self   gabapentin (NEURONTIN) 300 MG capsule [id number] No TAKE 2 CAPSULES BY MOUTH IN THE MORNING AND TAKE 3 CAPSULES AT BEDTIME [name] Dame, MD Taking Active   glucose blood (ACCU-CHEK GUIDE) test strip [id number] No Check blood sugar 2 times per day [name] Dame, MD Taking Active Self  hydrocortisone cream 1 % [id number] No Apply 1 Application topically daily as needed for itching. [provider] Taking Active Self  LANTUS SOLOSTAR 100 UNIT/ML Solostar Pen [id number] No Inject 30 Units into the skin at bedtime. [provider] Taking Active Self  losartan-hydrochlorothiazide (HYZAAR) 100-25 MG tablet [id number] No Take 1 tablet by mouth daily. [name] Dame, MD 01/12/2023 Expired 01/13/23 2359 Self  metFORMIN (GLUCOPHAGE-XR) 500 MG 24 hr tablet [id number] No Take 1 tablet (500 mg total) by mouth 2 (two) times daily. [name] Dame, MD Taking Active Self  metoprolol succinate (TOPROL-XL) 50 MG 24 hr tablet [id number] No TAKE 1 TABLET BY MOUTH ONCE DAILY WITH MEALS OR  IMMEDIATELY  FOLLOWING  A  MEAL [name] Dame, MD Taking Active Self  montelukast (SINGULAIR) 10 MG tablet [id number] No Take 1 tablet (10 mg total) by mouth at bedtime. [name] Dame, MD Taking Active Self  nitrofurantoin, macrocrystal-monohydrate, (MACROBID) 100 MG capsule [id number] No Take 1 capsule (100 mg total) by mouth daily. Stoneking, Reece Leader., MD Taking Active Self  ondansetron (ZOFRAN-ODT) 4 MG disintegrating tablet  [id number] No Take 1 tablet (4 mg total) by mouth every 8 (eight) hours as needed for nausea or vomiting. Brunetta Jeans, PA-C Taking Active   phenazopyridine (PYRIDIUM) 200 MG tablet [id number] No Take 1 tablet (200 mg total) by mouth 3 (three) times daily as needed for pain. Stoneking, Reece Leader., MD Taking Active   senna-docusate (SENNA-TIME S) 8.6-50 MG tablet [id number] No Take 1 tablet by mouth at bedtime as needed for mild constipation. [name] Axe, MD Taking Active Self   traZODone (DESYREL) 50 MG tablet [id number] No Take 1-2 tablets (50-100 mg total) by mouth at bedtime. Salley Slaughter, NP Taking Active   VENTOLIN HFA 108 (90 Base) MCG/ACT inhaler [id number] No INHALE 2 PUFFS BY MOUTH EVERY 6 HOURS AS NEEDED FOR WHEEZING FOR SHORTNESS OF Loyal Gambler, MD Taking Active Self           Patient Active Problem List   Diagnosis Date Noted   History of UTI 12/05/2022   Sepsis due to Escherichia coli with encephalopathy and septic shock ([place]) 11/17/2022   COPD exacerbation ([place]) 11/16/2022   Diabetic neuropathy ([place]) 08/29/2022   Vaginal lesion 08/29/2022   PTSD (post-traumatic stress disorder) 06/24/2022   GAD (generalized anxiety disorder) 06/24/2022   Major depressive disorder, recurrent episode, moderate ([place]) 06/24/2022   Urinary incontinence 06/20/2022   Intertrigo 06/20/2022   Arthritis of left shoulder region    S/P reverse total shoulder arthroplasty, 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   Healthcare maintenance 11/17/2021   Spondylolisthesis, lumbar region 02/11/2021    Class: Acute   Malignant neoplasm of upper-outer quadrant of right breast in female, estrogen receptor positive ([place]) 06/10/2018   Restless leg syndrome 02/04/2018   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   Depression 08/06/2015   Hypertension associated with diabetes ([place])    Aortic stenosis    Chronic diastolic congestive heart failure (HCC)    COPD (chronic obstructive pulmonary disease) ([place]) 09/21/2013   Obstructive sleep apnea 09/21/2013   Nephrolithiasis 09/21/2013   Nocturnal hypoxemia 09/12/2013   Hyperlipidemia 11/30/2012   History of pulmonary embolus (PE) 07/09/2012   Normocytic anemia 02/08/2012   Type 2 diabetes mellitus without complication, without long-term current use of insulin ([place]) 02/08/2012   Asthma 02/08/2012   GERD (gastroesophageal reflux disease)    CAD (coronary artery disease) 12/08/2011   Conditions to be addressed/monitored per PCP  order:  Chronic healthcare management needs, HTN, DM, HF, asthma, COPD, OSA, arthritis, GERD, CAD, HLD, h/o breast cancer, RLS  Care Plan : RN Care Manager Plan of Care  Updates made by Gayla Medicus, RN since 03/09/2023 12:00 AM     Problem: Health Promotion or Disease Self-Management (General Plan of Care)   Priority: High  Onset Date: 04/14/2022     Long-Range Goal: Chronic Disease Management and Care Coordination Needs   Start Date: 04/14/2022  Expected End Date: 04/10/2023  Recent Progress: Not on track  Priority: High  Note:   Current Barriers:  Knowledge Deficits related to plan of care for management of Anxiety and Depression along with other chronic health conditions, HTN, DM, HF, asthma, COPD, OSA, arthritis, GERD, CAD, HLD, h/o breast cancer, RLS Chronic Disease Management support and education needs related to Anxiety and Depression. DM, HTN, HF, asthma, OSA, arthritis, GERD, CAD, HLD, h/o breast cancer, RLS. 03/09/23:  Patient with right sided pain, recent CT-to f/u with provider.  Blood sugars 122-152.  Breathing controlled.  LCSW appt rescheduled.  Patient would also like to discuss polypharmacy with Pharmacist-appointment scheduled.  RNCM Clinical Goal(s):  Patient will verbalize understanding of plan for management of Anxiety and Depression, DM, HTN  as evidenced by patient report take all medications exactly as prescribed and will call provider for medication related questions as evidenced by patient report demonstrate understanding of rationale for each prescribed medication as evidenced by patient report attend all scheduled medical appointments  as evidenced by patient report continue to work with RN Care Manager to address care management and care coordination needs related to  Anxiety and Depression, DM, HTN as evidenced by adherence to CM Team Scheduled appointments work with Education officer, museum to address  related to the management of Mental Health Concerns  related to the  management of Anxiety and Depression as evidenced by review of EMR and patient or Education officer, museum report through collaboration with Consulting civil engineer, provider, and care team.   Interventions: Inter-disciplinary care team collaboration (see longitudinal plan of care) Evaluation of current treatment plan related to  self management and patient's adherence to plan as established by provider   (Status:  New goal.)  Long Term Goal Evaluation of current treatment plan related to Anxiety and Depression, Mental Health Concerns  self-management and patient's adherence to plan as established by provider. Discussed plans with patient for ongoing care management follow up and provided patient with direct contact information for care management team Advised patient to contact provider for any additional DME needs, CBG, and possible general surgeon referral. Reviewed medications with patient Collaborated with LCSW regarding anxiety/depression Reviewed scheduled/upcoming provider appointments Advised patient, providing education and rationale, to check cbg and record, calling provider  for findings outside established parameters  Social Work referral for anxiety/depression Discussed plans with patient for ongoing care management follow up and provided patient with direct contact information for care management team Assessed social determinant of health barriers Collaborated with [place] Medicaid Liaison regarding DME-motorized wheelchair and lift chair. LCSW appointment rescheduled. Collaborated with Pharmacist. Pharmacy referral for polypharmacy at patient's request.  Diabetes Interventions:  (Status:  New goal.) Long Term Goal Assessed patient's understanding of A1c goal: <7% Reviewed medications with patient and discussed importance of medication adherence Reviewed scheduled/upcoming provider appointments  Advised patient, providing education and rationale, to check cbg and record,  calling provider for findings outside established parameters Review of patient status, including review of consultants reports, relevant laboratory and other test results, and medications completed Assessed social determinant of health barriers Lab Results  Component Value Date   HGBA1C 7.1 (A) 08/27/2022  HGB A1C  6.6 on 01/08/23  Hypertension Interventions:  (Status:  New goal.) Long Term Goal Last practice recorded BP readings:  BP Readings from Last 3 Encounters:  09/12/22 133/69  08/27/22 (!) 109/58  07/01/22 137/75  12/25/22          124/60  Most recent eGFR/CrCl:  Lab Results  Component Value Date   EGFR 90 06/04/2022    No components found for: "CRCL"  Evaluation of current treatment plan related to hypertension self management and patient's adherence to plan as established by provider Reviewed medications with patient and discussed importance of compliance Discussed plans with patient for ongoing care management follow up and provided patient with direct contact information for care management team Advised patient, providing education and rationale, to monitor blood pressure daily and record, calling PCP for findings outside established parameters Reviewed scheduled/upcoming provider appointments including:  Assessed social determinant of health barriers   Patient Goals/Self-Care Activities: Take all medications as prescribed Attend all scheduled provider appointments  Call pharmacy for medication refills 3-7 days in advance of running out of medications Perform all self care activities independently  Perform IADL's (shopping, preparing meals, housekeeping, managing finances) independently Call provider office for new concerns or questions  Work with the social worker to address care coordination needs and will continue to work with the clinical team to address health care and disease management related needs  Follow Up Plan:  The patient has been provided with contact  information for the care management team and has been advised to call with any health related questions or concerns.  The care management team will reach out to the patient again over the next 30 business  days.    Long-Range Goal: Establish Plan of Care for Chronic Disease Management Needs   Priority: High  Note:   Timeframe:  Long-Range Goal Priority:  High Start Date:      04/14/22                       Expected End Date:     ongoing                  Follow Up Date 04/09/23   - schedule appointment for flu shot - schedule appointment for vaccines needed due to my age or health - schedule recommended health tests (blood work, mammogram, colonoscopy, pap test) - schedule and keep appointment for annual check-up    Why is this important?   Screening tests can find diseases early when they are easier to treat.  Your doctor or nurse will talk with you about which tests are important for you.  Getting shots for common diseases like the flu and shingles will help prevent them.  03/09/23:  Bone density tomorrow-recent URO and PCP appt, CT and ECHO   Follow Up:  Patient agrees to Care Plan and Follow-up.  Plan: The Managed Medicaid care management team will reach out to the patient again over the next 30 business  days. and The  Patient has been provided with contact information for the Managed Medicaid care management team and has been advised to call with any health related questions or concerns.  Date/time of next scheduled RN care management/care coordination outreach:  04/01/23 at 230.

## 2023-03-09 NOTE — Telephone Encounter (Signed)
Pt stated ins needed to know WHY pt had to have test and What is the test.  S/W Sharyn Lull and stated this is all in ov note.  Stated to send to billing. This is a post TVAR. Will send to Billing.

## 2023-03-09 NOTE — Telephone Encounter (Signed)
S/w pt is aware of recommendations from [place].

## 2023-03-09 NOTE — Telephone Encounter (Signed)
Patient called in regards to a CT scan she had done and stated she had some questions pertaining to it & also was questioning her results. Patient would like a call back @ [phone].

## 2023-03-09 NOTE — Telephone Encounter (Signed)
Patient was returning call. Please advise ?

## 2023-03-09 NOTE — Telephone Encounter (Signed)
Spoke with patient, she states she is just wanting to know what the CT results are. She states that she is hurting in her side and back.

## 2023-03-09 NOTE — Patient Instructions (Signed)
Hi [name], have a great day and week-thanks for speaking with me.  [name] was given information about Medicaid Managed Care team care coordination services as a part of their [facility] Medicaid benefit. [name] verbally consented to engagement with the [facility] Managed Care team.   If you are experiencing a medical emergency, please call 911 or report to your local emergency department or urgent care.   If you have a non-emergency medical problem during routine business hours, please contact your provider's office and ask to speak with a nurse.   For questions related to your [facility] health plan, please call: [phone] or go here:[email or web address]  If you would like to schedule transportation through your [facility] plan, please call the following number at least 2 days in advance of your appointment: [phone].  You can also use the MTM portal or MTM mobile app to manage your rides. For the portal, please go to [email or web address].  Call the [place] at [phone], at any time, 24 hours a day, 7 days a week. If you are in danger or need immediate medical attention call 911.  If you would like help to quit smoking, call 1-800-QUIT-NOW (614) 063-3632) OR Espaol: 1-855-Djelo-Ya QO:409462) o para ms informacin haga clic aqu or Text READY to 200-400 to register via text  [name] - following are the goals we discussed in your visit today:   Goals Addressed    Timeframe:  Long-Range Goal Priority:  High Start Date:      04/14/22                       Expected End Date:     ongoing                  Follow Up Date 04/09/23   - schedule appointment for flu shot - schedule appointment for vaccines needed due to my age or health - schedule recommended health tests (blood work, mammogram, colonoscopy, pap test) - schedule and keep appointment for annual check-up    Why is this important?   Screening tests  can find diseases early when they are easier to treat.  Your doctor or nurse will talk with you about which tests are important for you.  Getting shots for common diseases like the flu and shingles will help prevent them.  03/09/23:  Bone density tomorrow-recent URO and PCP appt, CT and ECHO  Patient verbalizes understanding of instructions and care plan provided today and agrees to view in [place]. Active MyChart status and patient understanding of how to access instructions and care plan via MyChart confirmed with patient.     The Managed Medicaid care management team will reach out to the patient again over the next 30 business  days.  The  Patient has been provided with contact information for the Managed Medicaid care management team and has been advised to call with any health related questions or concerns.   Aida Raider RN, BSN [place] Management Coordinator - Managed Medicaid High Risk [phone]   Following is a copy of your plan of care:  Care Plan : [place] of Care  Updates made by Gayla Medicus, RN since 03/09/2023 12:00 AM     Problem: Health Promotion or Disease Self-Management (General Plan of Care)   Priority: High  Onset Date: 04/14/2022     Long-Range Goal: Chronic Disease Management and Care Coordination Needs  Start Date: 04/14/2022  Expected End Date: 04/10/2023  Recent Progress: Not on track  Priority: High  Note:   Current Barriers:  Knowledge Deficits related to plan of care for management of Anxiety and Depression along with other chronic health conditions, HTN, DM, HF, asthma, COPD, OSA, arthritis, GERD, CAD, HLD, h/o breast cancer, RLS Chronic Disease Management support and education needs related to Anxiety and Depression. DM, HTN, HF, asthma, OSA, arthritis, GERD, CAD, HLD, h/o breast cancer, RLS. 03/09/23:  Patient with right sided pain, recent CT-to f/u with provider.  Blood sugars 122-152.  Breathing  controlled.  LCSW appt rescheduled.  Patient would also like to discuss polypharmacy with Pharmacist-appointment scheduled.  RNCM Clinical Goal(s):  Patient will verbalize understanding of plan for management of Anxiety and Depression, DM, HTN  as evidenced by patient report take all medications exactly as prescribed and will call provider for medication related questions as evidenced by patient report demonstrate understanding of rationale for each prescribed medication as evidenced by patient report attend all scheduled medical appointments  as evidenced by patient report continue to work with RN Care Manager to address care management and care coordination needs related to  Anxiety and Depression, DM, HTN as evidenced by adherence to CM Team Scheduled appointments work with Education officer, museum to address  related to the management of Mental Health Concerns  related to the management of Anxiety and Depression as evidenced by review of EMR and patient or Education officer, museum report through collaboration with Consulting civil engineer, provider, and care team.   Interventions: Inter-disciplinary care team collaboration (see longitudinal plan of care) Evaluation of current treatment plan related to  self management and patient's adherence to plan as established by provider   (Status:  New goal.)  Long Term Goal Evaluation of current treatment plan related to Anxiety and Depression, Mental Health Concerns  self-management and patient's adherence to plan as established by provider. Discussed plans with patient for ongoing care management follow up and provided patient with direct contact information for care management team Advised patient to contact provider for any additional DME needs, CBG, and possible general surgeon referral. Reviewed medications with patient Collaborated with LCSW regarding anxiety/depression Reviewed scheduled/upcoming provider appointments Advised patient, providing education and rationale, to  check cbg and record, calling provider  for findings outside established parameters  Social Work referral for anxiety/depression Discussed plans with patient for ongoing care management follow up and provided patient with direct contact information for care management team Assessed social determinant of health barriers Collaborated with [place] Medicaid Liaison regarding DME-motorized wheelchair and lift chair. LCSW appointment rescheduled. Collaborated with Pharmacist. Pharmacy referral for polypharmacy at patient's request.  Diabetes Interventions:  (Status:  New goal.) Long Term Goal Assessed patient's understanding of A1c goal: <7% Reviewed medications with patient and discussed importance of medication adherence Reviewed scheduled/upcoming provider appointments  Advised patient, providing education and rationale, to check cbg and record, calling provider for findings outside established parameters Review of patient status, including review of consultants reports, relevant laboratory and other test results, and medications completed Assessed social determinant of health barriers Lab Results  Component Value Date   HGBA1C 7.1 (A) 08/27/2022  HGB A1C  6.6 on 01/08/23  Hypertension Interventions:  (Status:  New goal.) Long Term Goal Last practice recorded BP readings:  BP Readings from Last 3 Encounters:  09/12/22 133/69  08/27/22 (!) 109/58  07/01/22 137/75  12/25/22          124/60  Most recent eGFR/CrCl:  Lab Results  Component Value Date   EGFR 90 06/04/2022    No components found for: "CRCL"  Evaluation of current treatment plan related to hypertension self management and patient's adherence to plan as established by provider Reviewed medications with patient and discussed importance of compliance Discussed plans with patient for ongoing care management follow up and provided patient with direct contact information for care management team Advised  patient, providing education and rationale, to monitor blood pressure daily and record, calling PCP for findings outside established parameters Reviewed scheduled/upcoming provider appointments including:  Assessed social determinant of health barriers   Patient Goals/Self-Care Activities: Take all medications as prescribed Attend all scheduled provider appointments Call pharmacy for medication refills 3-7 days in advance of running out of medications Perform all self care activities independently  Perform IADL's (shopping, preparing meals, housekeeping, managing finances) independently Call provider office for new concerns or questions  Work with the social worker to address care coordination needs and will continue to work with the clinical team to address health care and disease management related needs  Follow Up Plan:  The patient has been provided with contact information for the care management team and has been advised to call with any health related questions or concerns.  The care management team will reach out to the patient again over the next 30 business  days.

## 2023-03-10 ENCOUNTER — Inpatient Hospital Stay: Admission: RE | Admit: 2023-03-10 | Payer: Medicaid Other | Source: Ambulatory Visit

## 2023-03-11 NOTE — Telephone Encounter (Signed)
MD spoke with patient personally on 03/09/23.

## 2023-03-12 ENCOUNTER — Other Ambulatory Visit: Payer: Medicaid Other | Admitting: Licensed Clinical Social Worker

## 2023-03-12 ENCOUNTER — Telehealth (HOSPITAL_COMMUNITY): Payer: Self-pay | Admitting: *Deleted

## 2023-03-12 NOTE — Patient Outreach (Signed)
Medicaid Managed Care Social Work Note  03/12/2023 Name:  [name] MRN:  [id number] DOB:  [birth year]  [name] is an 62 y.o. year old female who is a primary patient of [name], MD.  The [place] team was consulted for assistance with:  [place] and Resources  [name] was given information about Medicaid Managed Care Coordination team services today. [name] Patient agreed to services and verbal consent obtained.  Engaged with patient  for by telephone forfollow up visit in response to referral for case management and/or care coordination services.   Assessments/Interventions:  Review of past medical history, allergies, medications, health status, including review of consultants reports, laboratory and other test data, was performed as part of comprehensive evaluation and provision of chronic care management services.  SDOH: (Social Determinant of Health) assessments and interventions performed: SDOH Interventions    Flowsheet Row Patient Outreach Telephone from 03/12/2023 in [place] Patient Outreach Telephone from 03/09/2023 in [place] Patient Outreach Telephone from 02/09/2023 in [place] Patient Outreach Telephone from 02/06/2023 in [place] Patient Outreach Telephone from 01/19/2023 in [place] Patient Outreach Telephone from 01/09/2023 in [place]  SDOH Interventions        Food Insecurity Interventions -- -- -- Intervention Not Indicated -- --  Housing Interventions -- -- -- Intervention Not Indicated -- --  Transportation Interventions -- Intervention Not Indicated -- -- -- --  Utilities Interventions -- Intervention Not Indicated -- -- -- --  Physical Activity Interventions -- -- -- -- -- Other (Comments)  [patient  not able to engage in this level of exercise]  Stress Interventions Offered [employer], Provide Counseling -- [place] [employer], Provide Counseling --  Social Connections Interventions -- -- -- -- -- Intervention Not Indicated       Advanced Directives Status:  See Care Plan for related entries.  Care Plan                 No Known Allergies  Medications Reviewed Today     Reviewed by Gayla Medicus, RN (Registered Nurse) on 03/09/23 at 1112  Med List Status: <None>   Medication Order Taking? Sig Documenting Provider Last Dose Status Informant  Accu-Chek FastClix Lancets MISC [id number] No Check blood sugar two times a day Lorella Nimrod, MD Taking Active Self  acetaminophen (TYLENOL) 500 MG tablet [id number] No Take 1,000 mg by mouth every 6 (six) hours as needed for moderate pain. [provider] Taking Active Self  albuterol (PROVENTIL) (2.5 MG/3ML) 0.083% nebulizer solution [id number] No USE 1 VIAL IN NEBULIZER EVERY 4 HOURS AS NEEDED FOR WHEEZING OR SHORTNESS OF BREATH Amponsah, Charisse March, MD Taking Active Self  amlodipine-atorvastatin (CADUET) 5-80 MG tablet [id number] No Take 1 tablet by mouth daily. Leigh Aurora, DO Taking Active Self  ASPIRIN ADULT LOW STRENGTH 81 MG EC tablet [id number] No TAKE 1 Tablet BY MOUTH ONCE DAILY Lorella Nimrod, MD Taking Active Self  B-D UF III MINI PEN NEEDLES 31G X 5 MM MISC [id number] No USE AS DIRECTED DAILY Angelica Pou, MD Taking Active Self  Blood Glucose Monitoring Suppl (ACCU-CHEK GUIDE) w/Device KIT [id number] No 1 each by Does not apply route 2 (two) times daily. Lorella Nimrod, MD Taking Active Self  buPROPion (WELLBUTRIN XL) 150 MG 24 hr tablet [id number]  No Take 1 tablet (150 mg total) by mouth daily. Salley Slaughter, NP Taking Active   busPIRone (BUSPAR) 15 MG tablet [id number] No Take 1 tablet (15 mg total) by mouth 2 (two) times  daily. Salley Slaughter, NP Taking Active   cholecalciferol (VITAMIN D3) 25 MCG (1000 UNIT) tablet [id number] No Take 1,000 Units by mouth daily. [provider] Taking Active Self  clopidogrel (PLAVIX) 75 MG tablet [id number] No TAKE 1 TABLET BY MOUTH IN THE [name], MD Taking Active Self  Continuous Blood Gluc Sensor (FREESTYLE LIBRE 3 SENSOR) [place] [id number] No Place 1 sensor on the skin every 14 days. Use to check glucose continuously [name], MD Taking Active Self  Dulaglutide (TRULICITY) 4.5 0000000 SOPN [id number] No Inject 4.5 mg as directed once a week. Drucie Opitz, MD Taking Active Self           Med Note Terrilyn Saver Jan 07, 2023 11:24 AM) Saturdays  Evolocumab (REPATHA SURECLICK) [id number] MG/ML [name] [id number] No INJECT 1 PEN INTO THE SKIN EVERY 14 DAYS Burnell Blanks, MD Taking Active Self  exemestane (AROMASIN) 25 MG tablet [id number] No TAKE 1 TABLET BY MOUTH ONCE DAILY AFTER Clint Lipps, MD Taking Active Self  ezetimibe (ZETIA) 10 MG tablet [id number] No Take 1 tablet (10 mg total) by mouth daily. [name], PA-C 11/15/2022 Expired 01/07/23 2359 Self  ferrous sulfate 325 (65 FE) MG tablet [id number] No Take 1 tablet (325 mg total) by mouth every other day. [name], MD Taking Active Self  Discontinued 05/09/20 1429   Fluticasone-Umeclidin-Vilant (TRELEGY ELLIPTA) 100-62.5-25 MCG/ACT AEPB [id number] No INHALE 1 PUFF ONCE DAILY Strength: 100-62.5-25 MCG/ACT Lacinda Axon, MD Taking Active Self  furosemide (LASIX) 40 MG tablet [id number] No Take 1 tablet (40 mg total) by mouth as needed for fluid or edema. Dorethea Clan, DO Taking Active Self  gabapentin (NEURONTIN) 300 MG capsule [id number] No TAKE 2 CAPSULES BY MOUTH IN THE MORNING AND TAKE 3 CAPSULES AT BEDTIME [name], MD Taking Active   glucose blood (ACCU-CHEK GUIDE) test strip [id number] No Check blood sugar 2 times per day [name],  MD Taking Active Self  hydrocortisone cream 1 % [id number] No Apply 1 Application topically daily as needed for itching. [provider] Taking Active Self  LANTUS SOLOSTAR 100 UNIT/ML Solostar Pen [id number] No Inject 30 Units into the skin at bedtime. [provider] Taking Active Self  losartan-hydrochlorothiazide (HYZAAR) 100-25 MG tablet [id number] No Take 1 tablet by mouth daily. [name], MD 01/12/2023 Expired 01/13/23 2359 Self  metFORMIN (GLUCOPHAGE-XR) 500 MG 24 hr tablet [id number] No Take 1 tablet (500 mg total) by mouth 2 (two) times daily. [name], MD Taking Active Self  metoprolol succinate (TOPROL-XL) 50 MG 24 hr tablet [id number] No TAKE 1 TABLET BY MOUTH ONCE DAILY WITH MEALS OR  IMMEDIATELY  FOLLOWING  A  MEAL [name], MD Taking Active Self  montelukast (SINGULAIR) 10 MG tablet [id number] No Take 1 tablet (10 mg total) by mouth at bedtime. [name], MD Taking Active Self  nitrofurantoin, macrocrystal-monohydrate, (MACROBID) 100 MG capsule [id number] No Take 1 capsule (100 mg total) by mouth daily. Stoneking, Reece Leader., MD Taking Active Self  ondansetron (ZOFRAN-ODT) 4 MG disintegrating tablet [id number] No Take 1 tablet (4 mg total) by mouth every 8 (eight) hours as needed for nausea or vomiting. Brunetta Jeans, PA-C Taking Active   phenazopyridine (PYRIDIUM) 200 MG tablet [id number] No Take  1 tablet (200 mg total) by mouth 3 (three) times daily as needed for pain. Stoneking, Reece Leader., MD Taking Active   senna-docusate (SENNA-TIME S) 8.6-50 MG tablet [id number] No Take 1 tablet by mouth at bedtime as needed for mild constipation. [name], MD Taking Active Self  traZODone (DESYREL) 50 MG tablet [id number] No Take 1-2 tablets (50-100 mg total) by mouth at bedtime. Salley Slaughter, NP Taking Active   VENTOLIN HFA 108 (90 Base) MCG/ACT inhaler [id number] No INHALE 2 PUFFS BY MOUTH EVERY 6 HOURS AS NEEDED FOR WHEEZING FOR SHORTNESS  OF Loyal Gambler, MD Taking Active Self            Patient Active Problem List   Diagnosis Date Noted   History of UTI 12/05/2022   Sepsis due to Escherichia coli with encephalopathy and septic shock ([place]) 11/17/2022   COPD exacerbation ([place]) 11/16/2022   Diabetic neuropathy ([place]) 08/29/2022   Vaginal lesion 08/29/2022   PTSD (post-traumatic stress disorder) 06/24/2022   GAD (generalized anxiety disorder) 06/24/2022   Major depressive disorder, recurrent episode, moderate ([place]) 06/24/2022   Urinary incontinence 06/20/2022   Intertrigo 06/20/2022   Arthritis of left shoulder region    S/P reverse total shoulder arthroplasty, 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   Healthcare maintenance 11/17/2021   Spondylolisthesis, lumbar region 02/11/2021    Class: Acute   Malignant neoplasm of upper-outer quadrant of right breast in female, estrogen receptor positive ([place]) 06/10/2018   Restless leg syndrome 02/04/2018   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   Depression 08/06/2015   Hypertension associated with diabetes ([place])    Aortic stenosis    Chronic diastolic congestive heart failure ([place])    COPD (chronic obstructive pulmonary disease) ([place]) 09/21/2013   Obstructive sleep apnea 09/21/2013   Nephrolithiasis 09/21/2013   Nocturnal hypoxemia 09/12/2013   Hyperlipidemia 11/30/2012   History of pulmonary embolus (PE) 07/09/2012   Normocytic anemia 02/08/2012   Type 2 diabetes mellitus without complication, without long-term current use of insulin ([place]) 02/08/2012   Asthma 02/08/2012   GERD (gastroesophageal reflux disease)    CAD (coronary artery disease) 12/08/2011    Conditions to be addressed/monitored per PCP order:  Depression  Care Plan : LCSW Plan of Care  Updates made by Greg Cutter, LCSW since 03/12/2023 12:00 AM     Problem: Depression Identification (Depression)      Long-Range Goal: Depressive Symptoms Identified   Start Date: 04/17/2022  Recent Progress:  On track  Note:   Long-Range Goal: I want to start mental health treatment    Start Date: 04/17/2022  Priority: High  Note:   Priority: High   Timeframe:  Long-Range Goal Priority:  High Start Date:   04/17/22                Expected End Date:  ongoing                 Follow Up Date--05/04/23 at 230 pm  - check out counseling - keep 90 percent of counseling appointments - schedule counseling appointment    Why is this important?             Beating depression may take some time.            If you don't feel better right away, don't give up on your treatment plan.     Current barriers:   Chronic Mental Health needs related to depression, stress and anxiety. Patient requires Support, Education, Resources, Referrals, Advocacy, and Care Coordination,  in order to meet Unmet Mental Health Needs. Patient will implement clinical interventions discussed today to decrease symptoms of depression and increase knowledge and/or ability of: coping skills. Mental Health Concerns and Social Isolation Patient lacks knowledge of available community counseling agencies and resources.   Clinical Goal(s): verbalize understanding of plan for management of Anxiety, Depression, and Stress and demonstrate a reduction in symptoms. Patient will connect with a provider for ongoing mental health treatment, increase coping skills, healthy habits, self-management skills, and stress reduction        Clinical Interventions:  Assessed patient's previous and current treatment, coping skills, support system and barriers to care. Patient provided history. Patient and spouse lived in a shared home with two other adults. Patient reports that this is a current and stable living situation.  Verbalization of feelings encouraged, motivational interviewing employed Emotional support provided, positive coping strategies explored Self care/establishing healthy boundaries emphasized Patient reports that she takes medication for  anxiety and depression. She reports that this has alleviated some of symptoms but not all. She reports that she still has crying spells. She needs help with finding psychiatry and counseling. She was successful in identifying triggers to anxiety and depression symptoms, in addition, to healthy coping skills.  Patient reports significant worsening anxiety and depression impacting her ability to function appropriately and carry out daily task. Patient receives strong support from spouse but he does not have anxiety or depression and is unable to relate to her and her daily mental health struggles.  Patient is agreeable to referral to [facility] for counseling and psychiatry. [facility] LCSW made referral on 04/17/22. Patient will call [facility] tomorrow on 04/18/22 to schedule counseling and psychiatry appointments. Email sent to her with [facility]'s contact information.  LCSW provided education on relaxation techniques such as meditation, deep breathing, massage, grounding exercises or yoga that can activate the body's relaxation response and ease symptoms of stress and anxiety. LCSW ask that when pt is struggling with difficult emotions and racing thoughts that they start this relaxation response process. LCSW provided extensive education on healthy coping skills for anxiety. SW used active and reflective listening, validated patient's feelings/concerns, and provided emotional support. Patient will work on implementing appropriate self-care habits into their daily routine such as: staying positive, writing a gratitude list, drinking water, staying active around the house, taking their medications as prescribed, combating negative thoughts or emotions and staying connected with their family and friends. Positive reinforcement provided for this decision to work on this. Patient was receptive to anxiety and depression management coping skill education. She will try to increase her socialization over the next 90 days to decrease  isolation.  Patient has problems with sleep disturbance. LCSW provided education on healthy sleep hygiene and what that looks like. LCSW encouraged patient to implement a night time routine into her schedule that works best for her and that she is able to maintain. Advised patient to implement deep breathing/grounding/meditation/self-care exercises into her nightly routine to combat racing thoughts at night. LCSW encouraged patient to wake up at the same time each day, make their sleeping environment comfortable, exercise when able, to limit naps and to not eat or drink anything right before bed.   Motivational Interviewing employed Depression screen reviewed  PHQ2/ PHQ9 completed Mindfulness or Relaxation training provided Active listening / Reflection utilized  Advance Care and HCPOA education provided Emotional Support Provided Problem [place] strategies reviewed Provided psychoeducation for mental health needs  Provided brief CBT  Reviewed mental health medications and  discussed importance of compliance:  Quality of sleep assessed & Sleep Hygiene techniques promoted  Participation in counseling encouraged  Verbalization of feelings encouraged  Suicidal Ideation/Homicidal Ideation assessed: Patient denies SI/HI  Review resources, discussed options and provided patient information about  [place] care team collaboration (see longitudinal plan of care) UPDATE 05/21/22- Patient was successfully set up with counseling at [facility] but was not set up with psychiatry. She is in need of both services. Patient has an upcoming counseling session at [facility] on 06/24/22. [facility] LCSW completed joint call with patient to [facility] but was unable to reach anyone. [facility] LCSW left a voice message asking for them to return call to patient to get her scheduled with psychiatry as [facility] LCSW ask for both psychiatry and counseling in referral. [facility] LCSW ask if they needed an additional  referral to please let [facility] LCSW know so this can be completed. [facility] LCSW sent patient an email with [facility]'s contact information including their address as well as a list of coping skills for depression and anxiety. Patient's spouse has multiple health conditions and tells patient that he "wishes to die" often which concerns her. [facility] LCSW provided emotional support and brief coping skill education on stress management.  UPDATE 06/12/22- Patient successfully contacted [facility] and set up appointments for both psychiatry and counseling. Patient is eager to start treatment. Brief self-care education provided to patient. Patient is agreeable to contact [facility] LCSW directly if needed. [facility] LCSW will make one last follow up on call to ensure that patient was successfully connected to a long term mental health provider. Patient denies any current crises or concerns.  UPDATE 07/18/22- Patient has not been set up with counseling at [facility] and she has completed her first counseling session but she has not been set up with psychiatry still. She has made several calls to agency but has been unsuccessful in reaching them. Patient has an in person appointment at [facility] on 07/30/22 and she will ask them to enroll her in psychiatry at that time. [facility] LCSW will follow up and continue to support patient as needed. UPDATE 08/01/22- Patient reports that she is doing extremely well and that her mood has lifted over the last few weeks. She reports that her first counselor appointment well very well and she was able to build rapport with him. She will see her counselor 2X per month. Her next appointment for therapy is on 08/20/22. Patient has her initial appointment with psychiatry is on the 08/12/22. UPDATE- Patient successfully completed her psychiatry appointment and has a follow up scheduled in two months. However, patient reports having ongoing issues with sleep even with the medication and reports needing this medication to be adjusted. Pt will  discuss this concern with her therapist on 09/09/22 but was encouraged to call [facility] before then if needed to address this issue. [facility] LCSW will follow up in two weeks. UPDATE- Patient expressed to her counselor about her ongoing issues with her sleep and he informed her to tell her psychiatrist. [facility] LCSW sent message to her psychiatrist with this medication dosage adjustment request. [facility] LCSW educated patient on healthy sleep hygiene. Patient denies any current crises. Patient is agreeable to contact [facility] herself to inquire about her ongoing sleep issues. UPDATE 10/17- Patient continues to struggle with ongoing sleep issues and has decided not to call her psychiatrist but wait until her scheduled appointment on 11/06/22 to address these concerns. [facility] LCSW suggested against this as patient will not been seen  in over 4 weeks but patient reports that she can manage until then. Healthy sleep and hygiene techniques were provided to patient. Patient will continue to try as much healthy self-care into her daily routine to combat these negative symptoms. [facility] LCSW will follow up the day after her psychiatry appointment. UPDATE- Patient met with counselor and psychiatrist yesterday. She reports that there was an adjustment in her medicine in order to improve her sleep. Patient reports that her sleep has somewhat improved. She reports that she was able to sign up successfully for Social Security. 01/09/23- Patient contacted [facility] Management department and left a message requesting a return call from [facility] LCSW. [facility] LCSW completed call and was informed that patient had called [facility] yesterday and was on hold for a hour. She states that she is really needing to get a new medicine for sleep. The nurses in the back said that she would need to arrange a telephone call for Dr. Alean Rinne to reach out to assist with this bc she hasn't seen Dr. Alean Rinne in 3 months and will not until next month. Patient as left on hold and eventually hung up.  In basket message was sent to staff and psychiatrist at [facility] and these messages were read and hopefully appointment will be scheduled. 01/19/23 update- Pt spoke with her psychiatrist and was able to adjust her medications to see if it could improve her sleep routine. Patient reports that she had a very emotional session with her counselor today. Patient continues to make great strides towards improving her self-worth. Positive reinforcement provided, 02/09/23 update- Patient reports being sick right now. She reports that her depression increases whenever she is sick and she was given extensive self-care education pertaining to recovery. Patient reports that her sleep as improved with the newly adjusted medication that her psychiatrist prescribed. 03/12/23 LCSW update- Ongoing self-care education provided. Patient reports issues with her Wellbutrin. She shares that she will call her psychiatrist today for advice as this medication is now making her dizzy. Email sent to patient with coping skills and reminders.  Patient Goals/Self-Care Activities: Over the next 120 days Attend scheduled medical appointments Utilize healthy coping skills and supportive resources discussed Contact PCP with any questions or concerns Keep 90 percent of counseling appointments Call your insurance provider for more information about your Enhanced Benefits  Check out counseling resources provided  Begin personal counseling with LCSW, to reduce and manage symptoms of Depression and Stress, until well-established with mental health provider Accept all calls from representative with [facility] in an effort to establish ongoing mental health counseling and supportive services. Incorporate into daily practice - relaxation techniques, deep breathing exercises, and mindfulness meditation strategies. Talk about feelings with friends, family members, spiritual advisor, etc. Contact LCSW directly [phone]), if you have questions, need  assistance, or if additional social work needs are identified between now and our next scheduled telephone outreach call. Call [phone] for mental health hotline/crisis line if needed (24/7 available) Try techniques to reduce symptoms of anxiety/negative thinking (deep breathing, distraction, positive self talk, etc)  - develop a personal safety plan - develop a plan to deal with triggers like holidays, anniversaries - exercise at least 2 to 3 times per week - have a plan for how to handle bad days - journal feelings and what helps to feel better or worse - spend time or talk with others at least 2 to 3 times per week - watch for early signs of feeling worse - begin personal counseling - call  and visit an old friend - check out volunteer opportunities - join a support group - laugh; watch a funny movie or comedian - learn and use visualization or guided imagery - perform a random act of kindness - practice relaxation or meditation daily - start or continue a personal journal - practice positive thinking and self-talk -continue with compliance of taking medication        05/21/2022   12:53 PM 04/17/2022    3:17 PM 11/14/2021    4:02 PM 08/21/2021    4:33 PM 12/10/2020    4:00 PM  Depression screen PHQ 2/9  Decreased Interest '2 2 1 '$ 0 1  Down, Depressed, Hopeless '2 2 1 '$ 0 0  PHQ - 2 Score '4 4 2 '$ 0 1  Altered sleeping '3 3 3   1  '$ Tired, decreased energy '3 3 1   1  '$ Change in appetite '3 3 1   1  '$ Feeling bad or failure about yourself  0 1 0   0  Trouble concentrating 0 0 0   0  Moving slowly or fidgety/restless 0   0   0  Suicidal thoughts 0 0 0   0  PHQ-9 Score '13 14 7   4  '$ Difficult doing work/chores Somewhat difficult Somewhat difficult Not difficult at all   Not difficult at all        24- Hour Availability:    [facility]  [address] [place], [place] [facility] [phone]   [facility] [employer]  [place]  [phone]    [place]  [phone] (after hours)   Therapeutic Alternative/Mobile Crisis   [phone]   [place] National Suicide Hotline  705-027-9783 (TALK) OR 988   Call 911 or go to emergency room   [facility]  [phone]);  [place] and [employer]  [phone]); [place], [place], [place], [place], Person, [place], [place]           Follow up:  Patient agrees to Care Plan and Follow-up.  Plan: The Managed Medicaid care management team will reach out to the patient again over the next 90 days.  Date/time of next scheduled Social Work care management/care coordination outreach:  05/04/23 at 230 pm.  [name], [place], MSW, [place] Medicaid LCSW [email or web address] Phone: [phone]

## 2023-03-12 NOTE — Patient Instructions (Signed)
Visit Information  [name] was given information about Medicaid Managed Care team care coordination services as a part of their [facility] Medicaid benefit. [name] verbally consented to engagement with the [facility] Managed Care team.   If you are experiencing a medical emergency, please call 911 or report to your local emergency department or urgent care.   If you have a non-emergency medical problem during routine business hours, please contact your provider's office and ask to speak with a nurse.   For questions related to your [facility] health plan, please call: [phone] or go here:[email or web address]  If you would like to schedule transportation through your [facility] plan, please call the following number at least 2 days in advance of your appointment: [phone].  You can also use the MTM portal or MTM mobile app to manage your rides. For the portal, please go to [email or web address].  Call the [place] at [phone], at any time, 24 hours a day, 7 days a week. If you are in danger or need immediate medical attention call 911.  If you would like help to quit smoking, call 1-800-QUIT-NOW (303) 425-6648) OR Espaol: 1-855-Djelo-Ya HD:1601594) o para ms informacin haga clic aqu or Text READY to 200-400 to register via text  [name], [place], MSW, [employer] Managed Medicaid LCSW [email or web address] Phone: [phone]

## 2023-03-12 NOTE — Telephone Encounter (Signed)
VM from patient asking to speak with Dr Ronne Binning because she is feeling "wobbly" on her Welbutrin and doesn't like the way she is feeling. She would like to speak with provider re changing her meds. She has a future appt with Dr Ronne Binning on 04/29/23.

## 2023-03-13 ENCOUNTER — Other Ambulatory Visit (HOSPITAL_COMMUNITY): Payer: Self-pay | Admitting: Psychiatry

## 2023-03-13 DIAGNOSIS — F331 Major depressive disorder, recurrent, moderate: Secondary | ICD-10-CM

## 2023-03-13 DIAGNOSIS — F411 Generalized anxiety disorder: Secondary | ICD-10-CM

## 2023-03-13 MED ORDER — DULOXETINE HCL 20 MG PO CPEP: 20.0000 mg | ORAL_CAPSULE | Freq: Every day | ORAL | 3 refills | Status: DC

## 2023-03-13 NOTE — Telephone Encounter (Signed)
Patient informed Probation officer that she dislikes Wellbutrin.  She notes that her primary care doctor added it to her medication regimen to increase her energy and decrease the anxiety and depression.  She however notes that she has been having side effects.  Patient reports that she feels wobbly and dizzy.  She also notes that it drained her energy.  Patient reports that she continues to be anxious and depressed.  She also notes that she has pain throughout her body.  Provider discussed starting Cymbalta to help manage anxiety, depression, and pain.  Patient agreeable to starting Cymbalta 20 mg daily.Potential side effects of medication and risks vs benefits of treatment vs non-treatment were explained and discussed. All questions were answered.  No other concerns noted at this time.

## 2023-03-14 ENCOUNTER — Other Ambulatory Visit: Payer: Self-pay | Admitting: Cardiovascular Disease

## 2023-03-14 ENCOUNTER — Other Ambulatory Visit: Payer: Self-pay | Admitting: Student

## 2023-03-14 DIAGNOSIS — E785 Hyperlipidemia, unspecified: Secondary | ICD-10-CM

## 2023-03-14 DIAGNOSIS — I2510 Atherosclerotic heart disease of native coronary artery without angina pectoris: Secondary | ICD-10-CM

## 2023-03-16 NOTE — Telephone Encounter (Signed)
Next appt scheduled 3/20 with Dr Coy Saunas.

## 2023-03-18 ENCOUNTER — Ambulatory Visit (INDEPENDENT_AMBULATORY_CARE_PROVIDER_SITE_OTHER): Payer: Medicaid Other | Admitting: Student

## 2023-03-18 VITALS — BP 130/50 | HR 84 | Temp 98.3°F | Ht 63.0 in | Wt 303.5 lb

## 2023-03-18 DIAGNOSIS — K439 Ventral hernia without obstruction or gangrene: Secondary | ICD-10-CM

## 2023-03-18 NOTE — Progress Notes (Unsigned)
CC: Abdominal pain, right flank pain  HPI:  [name] is a 62 y.o. female with PMH as below who presents to clinic for evaluation of abdominal pain and right flank pain. Please see problem based charting for evaluation, assessment and plan.  Past Medical History:  Diagnosis Date   Anemia    Anxiety    Aortic valve stenosis, severe    Arthritis    PAIN AND SEVERE OA LEFT KNEE ; S/P RIGHT TKA ON 02/03/12; HAS LOWER BACK PAIN-UNABLE TO STAND MORE THAN 10 MIN; ARTHRITIS "ALL OVER"   Asthma    Blood transfusion    2013[facility]   Breast cancer in female [facility])    Right   CAD (coronary artery disease)    Cath 2010 with DES x 1 RCA-- PT'S CARDIOLOGIST IS DR. MCALHANY   Chronic diastolic congestive heart failure (HCC)    COPD (chronic obstructive pulmonary disease) (HCC)    Depression    Diabetes mellitus DIAGNOSED IN2010   Dyspnea    with much ambulation   Eczema    on back   Headache    migraines younger- rare now 02/07/21   Heart murmur    no current problems   History of hiatal hernia    History of kidney stones    passed or blasted   Hyperlipidemia    Hypertension    Morbid obesity with body mass index of 60.0-69.9 in adult [facility])    Myocardial infarction ([place])    PT THINKS SHE WAS DX WITH MI AT THE TIME OF HEART STENTING   Neuromuscular disorder (HCC)    bilateral arm/hands   Oxygen dependent    uses 3L oxygen night/prn   Personal history of radiation therapy    Pneumonia    Pulmonary embolism ([place]) 02/08/2012   S/P RT TOTAL KNEE ON 02/03/12--ON 02/08/12--DEVELOPED ACUTE SOB AND CHEST PAIN--AND DIAGNOSED WITH  PULMONARY EMBOLUS AND PNEUMONIA   Restless leg syndrome    Sleep apnea    uses 3 liters O2 at night as needed   Uterine fibroid    NO PROBLEMS AT PRESENT FROM THE FIBROIDS-STATES SHE IS POST MENOPAUSAL-LAST MENSES 2010 EXCEPT FOR EPISODE THIS YR OF BLEEDING RELATED TO FIBROIDS.   Weakness    BOTH HANDS - S/P BILATERAL CARPAL TUNNEL RELEASE--BUT STILL HAS  WEAKNESS--OFTEN DROPS THINGS    Review of Systems:  Constitutional: Negative for fever or fatigue Respiratory: Negative for shortness of breath Cardiac: Negative for chest pain MSK: Positive for right flank pain Abdomen: Positive for abdominal pain and occasional constipation.  Negative for nausea, vomiting, or diarrhea GU: Negative for frequent urination or difficulty urinating Neuro: Negative for headache or weakness  Physical Exam: General: Pleasant, morbidly obese woman on scooter. No acute distress. Cardiac: RRR. No murmurs, rubs or gallops. No LE edema Respiratory: Lungs CTAB. No wheezing or crackles. Decreased air movement at the bases. Abdominal: Soft. Abdominal obesity. Palpable ventral hernia at the midline, tender to deep palpation but reducible.  Skin: Warm, dry and intact without rashes or lesions Extremities: Atraumatic. Full ROM. Palpable radial and DP pulses. Neuro: A&O x 3. Moves all extremities.    Vitals:   03/18/23 1323  BP: (!) 130/50  Pulse: 84  Temp: 98.3 F (36.8 C)  TempSrc: Oral  SpO2: 90%  Weight: (!) 303 lb 8 oz (137.7 kg)  Height: 5\' 3"  (1.6 m)    Assessment & Plan:   Abdominal hernia Patient with a history of abdominal hernia and nephrolithiasis  presenting today for evaluation of abdominal and right flank pain. The pain is located on her right abdomen described as dull and pinching.  Pain is intermittent and usually mild in nature but has been going on for about 3 weeks. Patient was evaluated by her urologist 3 weeks ago and CT renal stone was ordered for further evaluation.  Imaging did not show any urological cause of patient's abdominal pain but did show "Anterior abdominal hernia repair mesh with a moderate-sized fat containing ventral midline hernia along the superior margin of the repair mesh. Two additional fat containing midline abdominal hernias are inferior to the mesh 1 is moderate in size and the other is small". Patient reports she had a  ventral hernia repair more than 10 years ago in [place] but does not remember who the surgeon. On exam, there is some tenderness to deep palpation of the hernia but is currently reducible. I am not concerned for strangulation or incarceration of the hernia at this moment but patient will require evaluation by general surgery as soon as possible.  Patient has had a cholecystectomy and imaging did not show any other intra-abdominal pathology to explain her abdominal pain.  Plan: -Urgent referral to general surgery -Advised to go to the ER if abdominal pain worsens, she develops worsening constipation or difficulty with bowel movements.    See Encounters Tab for problem based charting.  Patient discussed with Dr.  Thomes Cake, MD, MPH

## 2023-03-18 NOTE — Patient Instructions (Addendum)
Thank you, [name] for allowing [place] to provide your care today. Today we discussed your abdominal pain and right flank pain.  I am referring you to general surgery to evaluate your hernia.  If you start having worsening pain constant pain, redness around your hernia, constipation or difficulty with bowel movement follow-up with [place] or go to the ER.  I have place a referrals to general surgery  My Chart Access: [email or web address]  Please follow-up in 3 months or as needed  Please make sure to arrive 15 minutes prior to your next appointment. If you arrive late, you may be asked to reschedule.    We look forward to seeing you next time. Please call our clinic at [phone] if you have any questions or concerns. The best time to call is Monday-Friday from 9am-4pm, but there is someone available 24/7. If after hours or the weekend, call the main hospital number and ask for the Internal Medicine Resident On-Call. If you need medication refills, please notify your pharmacy one week in advance and they will send [place] a request.   Thank you for letting us take part in your care. Wishing you the best!  Lacinda Axon, MD 03/18/2023, 1:59 PM IM Resident, PGY-3 Oswaldo Milian 41:10

## 2023-03-19 ENCOUNTER — Encounter: Payer: Self-pay | Admitting: Student

## 2023-03-19 ENCOUNTER — Other Ambulatory Visit: Payer: Medicaid Other | Admitting: Pharmacist

## 2023-03-19 DIAGNOSIS — K469 Unspecified abdominal hernia without obstruction or gangrene: Secondary | ICD-10-CM | POA: Insufficient documentation

## 2023-03-19 NOTE — Progress Notes (Signed)
03/19/2023 Name: [name] MRN: [id number] DOB: [birth year]  Chief Complaint  Patient presents with   Medication Management    [name] is a 62 y.o. year old female who presented for a telephone visit.   They were referred to the pharmacist by their Case Management Team  for assistance in managing complex medication management. Patient doesn't like taking as many medications as she is taking.   Patient is participating in a Managed Medicaid Plan:  Yes  Subjective:  Care Team: Primary Care Provider: [name], MD ; Next Scheduled Visit: not scheduled   Medication Access/Adherence  Current Pharmacy:  [place], [place] [address] Phone: [phone] Fax: [phone]  [name] [address] [place] [address] Phone: [phone] Fax: [phone]   Patient reports affordability concerns with their medications: No  Patient reports access/transportation concerns to their pharmacy: No  Patient reports adherence concerns with their medications:  No     Diabetes:  Current medications: metformin XR 500 mg twice daily, Lantus 30 units daily, Trulicity 4.5 mg weekly- reports her pharmacy has not been able to get this dose of Trulicity in lately   Patient denies hypoglycemic s/sx including dizziness, shakiness, sweating. Patient denies hyperglycemic symptoms including polyuria, polydipsia, polyphagia, nocturia, neuropathy, blurred vision.  Heart Failure:  Current medications:  ACEi/ARB/ARNI: losartan 100 mg daily SGLT2i: none,  Beta blocker: metoprolol succinate 50 mg daily Mineralocorticoid Receptor Antagonist: none Diuretic regimen: furosemide PRN - has not needed recently Additional antihypertensives: amlodipine 5 mg daily   Patient denies volume overload signs or symptoms including shortness of breath, lower  extremity edema, increased use of pillows at night  Does report fatigue, is not sure what is the root cause.    Hyperlipidemia/ASCVD Risk Reduction  Current lipid lowering medications: atorvastatin 80 mg daily, ezetimibe 10 mg daily, Repatha 140 mg every 14 days  Antiplatelet regimen: clopidogrel 75 mg daily, notes she does not consistently take aspirin  ASCVD History: s/p TAVR (2015), CAD s/p DES 01/2009   Depression/Anxiety and Chronic Pain:  Current medications: duloxetine 20 mg daily; gabapentin 600 mg in the afternoon, 900 mg QPM; buspirone 7.5 mg twice daily - reports she felt the 15 mg too much, but feels like the lower dose is helping. Trazodone 50 mg QPM - reports it seems to help some - reports she goes to bed around 9:30, but it takes a few hours to fall asleep.   Does report some sedation. Recently started on duloxetine. Notes some benefit. Does endorse some use of caffeine in the afternoons, use of technology in bed.    COPD/Asthma  Current medications: Trelegy 100 mg daily, montelukast 10 mg daily; albuterol HFA  Reports breathing is relatively well controlled; allergies well controlled on montelukast (does derive benefit)   Hx Breast Cancer; Current medications: exemestane 25 mg daily - due to end treatment December 2024  Hx renal obstruction: Current medications: nitrofurantoin 100 mg daily; reports per Urology that she will be able to discontinue this medication when she completes current supply   Objective:  Lab Results  Component Value Date   HGBA1C 6.6 (H) 01/08/2023    Lab Results  Component Value Date   CREATININE 0.89 01/08/2023   BUN 20 01/08/2023   NA 136 01/08/2023   K 3.9 01/08/2023   CL 98 01/08/2023   CO2 29 01/08/2023    Lab Results  Component Value Date   CHOL 165 12/25/2022   HDL 49 12/25/2022   LDLCALC 101 (H) 12/25/2022   LDLDIRECT 162.0 10/17/2013   TRIG 78 12/25/2022   CHOLHDL 3.4 12/25/2022    Medications Reviewed Today      Reviewed by Osker Mason, RPH-CPP (Pharmacist) on 03/19/23 at 1455  Med List Status: <None>   Medication Order Taking? Sig Documenting Provider Last Dose Status Informant  Accu-Chek FastClix Lancets MISC [id number]  Check blood sugar two times a day Lorella Nimrod, MD  Active Self  acetaminophen (TYLENOL) 500 MG tablet [id number]  Take 1,000 mg by mouth every 6 (six) hours as needed for moderate pain. [provider]  Active Self  albuterol (PROVENTIL) (2.5 MG/3ML) 0.083% nebulizer solution [id number]  USE 1 VIAL IN NEBULIZER EVERY 4 HOURS AS NEEDED FOR WHEEZING OR SHORTNESS OF BREATH Lacinda Axon, MD  Active Self  amlodipine-atorvastatin (CADUET) 5-80 MG tablet [id number] Yes Take 1 tablet by mouth daily. Leigh Aurora, DO Taking Active Self  ASPIRIN ADULT LOW STRENGTH 81 MG EC tablet [id number] No TAKE 1 Tablet BY MOUTH ONCE DAILY  Patient not taking: Reported on 03/19/2023   Lorella Nimrod, MD Not Taking Active Self  B-D UF III MINI PEN NEEDLES 31G X 5 MM MISC [id number]  USE AS DIRECTED DAILY Angelica Pou, MD  Active Self  Blood Glucose Monitoring Suppl ([place]) w/Device KIT [id number] Yes 1 each by Does not apply route 2 (two) times daily. Lorella Nimrod, MD Taking Active Self  busPIRone (BUSPAR) 15 MG tablet [id number] Yes Take 1 tablet (15 mg total) by mouth 2 (two) times daily. Salley Slaughter, NP Taking Active            Med Note Jodi Mourning, Tillie Fantasia Mar 19, 2023  2:19 PM) Taking 7.5 mg twice daily  cholecalciferol (VITAMIN D3) 25 MCG (1000 UNIT) tablet [id number]  Take 1,000 Units by mouth daily. [provider]  Active Self  clopidogrel (PLAVIX) 75 MG tablet [id number] Yes TAKE 1 TABLET BY MOUTH IN THE [name], MD Taking Active Self  Continuous Blood Gluc Sensor (FREESTYLE LIBRE 3 SENSOR) [place] [id number] Yes Place 1 sensor on the skin every 14 days. Use to check glucose continuously [name], MD Taking Active Self   DULoxetine (CYMBALTA) 20 MG capsule [id number] Yes Take 1 capsule (20 mg total) by mouth daily. Salley Slaughter, NP Taking Active   Evolocumab [facility] SURECLICK) [id number] MG/ML [name] [id number] Yes INJECT 1 PEN INTO THE SKIN EVERY 14 DAYS Burnell Blanks, MD Taking Active   exemestane (AROMASIN) 25 MG tablet [id number] Yes TAKE 1 TABLET BY MOUTH ONCE DAILY AFTER Clint Lipps, MD Taking Active Self  ezetimibe (ZETIA) 10 MG tablet [id number] Yes Take 1 tablet (10 mg total) by mouth daily. [name], PA-C Taking Active Self  ferrous sulfate 325 (65 FE) MG tablet [id number] No Take 1 tablet (325 mg total) by mouth every other day.  Patient not taking: Reported on 03/19/2023   [name], MD Not Taking Active Self    Discontinued 05/09/20 1429   Fluticasone-Umeclidin-Vilant (TRELEGY ELLIPTA) 100-62.5-25 MCG/ACT AEPB [id number] Yes INHALE 1 PUFF ONCE DAILY Strength: 100-62.5-25 MCG/ACT Lacinda Axon, MD Taking Active Self  furosemide (LASIX) 40 MG tablet [id number] No Take 1 tablet (40 mg total) by mouth as needed for fluid or edema.  Patient not taking: Reported on 03/19/2023   Dorethea Clan, DO Not Taking Active Self  gabapentin (  NEURONTIN) 300 MG capsule [id number] Yes TAKE 2 CAPSULES BY MOUTH IN THE MORNING AND TAKE 3 CAPSULES AT BEDTIME [name], MD Taking Active   glucose blood (ACCU-CHEK GUIDE) test strip [id number] Yes Check blood sugar 2 times per day [name], MD Taking Active Self  hydrocortisone cream 1 % [id number]  Apply 1 Application topically daily as needed for itching. [provider]  Active Self  LANTUS SOLOSTAR 100 UNIT/ML Solostar Pen [id number] Yes Inject 30 Units into the skin at bedtime. [provider] Taking Active Self  losartan-hydrochlorothiazide (HYZAAR) 100-25 MG tablet [id number] Yes Take 1 tablet by mouth daily. [name], MD Taking Active Self  metFORMIN (GLUCOPHAGE-XR) 500 MG 24 hr tablet [id number]  Yes Take 1 tablet (500 mg total) by mouth 2 (two) times daily. [name], MD Taking Active Self  metoprolol succinate (TOPROL-XL) 50 MG 24 hr tablet [id number] Yes TAKE 1 TABLET BY MOUTH ONCE DAILY WITH MEALS OR  IMMEDIATELY  FOLLOWING  A  MEAL [name], MD Taking Active Self  montelukast (SINGULAIR) 10 MG tablet [id number] Yes Take 1 tablet (10 mg total) by mouth at bedtime. [name], MD Taking Active Self  nitrofurantoin, macrocrystal-monohydrate, (MACROBID) 100 MG capsule [id number] Yes Take 1 capsule (100 mg total) by mouth daily. Stoneking, Reece Leader., MD Taking Active Self  ondansetron (ZOFRAN-ODT) 4 MG disintegrating tablet [id number] No Take 1 tablet (4 mg total) by mouth every 8 (eight) hours as needed for nausea or vomiting.  Patient not taking: Reported on 03/19/2023   Brunetta Jeans, PA-C Not Taking Active   phenazopyridine (PYRIDIUM) 200 MG tablet [id number] No Take 1 tablet (200 mg total) by mouth 3 (three) times daily as needed for pain.  Patient not taking: Reported on 03/19/2023   Primus Bravo., MD Not Taking Active   senna-docusate (SENNA-TIME S) 8.6-50 MG tablet [id number] Yes Take 1 tablet by mouth at bedtime as needed for mild constipation. [name], MD Taking Active Self  traZODone (DESYREL) 50 MG tablet [id number] Yes Take 1-2 tablets (50-100 mg total) by mouth at bedtime. Salley Slaughter, NP Taking Active   TRULICITY 4.5 0000000 Bon[place] Aid [id number] Yes INJECT 4.5MG  SUBCUTANEOUSLY ONCE WEEKLY [name], MD Taking Active   VENTOLIN HFA 108 (90 Base) MCG/ACT inhaler [id number]  INHALE 2 PUFFS BY MOUTH EVERY 6 HOURS AS NEEDED FOR WHEEZING FOR SHORTNESS OF Loyal Gambler, MD  Active Self              Assessment/Plan:   Diabetes: - Currently controlled but with opportunity for optimization.  - Reviewed long term cardiovascular and renal outcomes of uncontrolled blood sugar - Reviewed goal A1c, goal fasting, and goal 2  hour post prandial glucose - Recommend to switch to Ozempic for greater potency in regards to weight management and current availability. Continue Lantus 30 units daily, but likely future ability to reduce dose, pending tolerability of Ozempic titration. Recommend to continue metformin. Will discuss with PCP.    Hyperlipidemia/ASCVD Risk Reduction: - Currently uncontrolled.  - Recommend to recheck cholesterol with next PCP or cardiology visit. Goal LDL <70. If significant improvement with medication adherence, may be able to eliminate ezetimibe moving forward.   Heart Failure: - Currently appropriately managed - Reviewed appropriate blood pressure monitoring technique and reviewed goal blood pressure - Recommend to continue current regimen    COPD/Asthma: - Currently controlled.  - Reviewed appropriate inhaler technique. - Recommend to continue current regimen given dual COPD and Asthma, and report of benefit with  montelukast.    Depression/Anxiety, Chronic Pain, insomnia - Currently uncontrolled - Reviewed nonpharmacologic methods for insomnia treatment, including avoiding caffeine after noon, avoiding screen time before/in bed, increasing physical activity during the day - Consider dose increase of duloxetine moving forward to help with mental health, chronic pain. Also discussed with patient the sedating nature of gabapentin. She is going to reduce her afternoon gabapentin to 1 and see if this still manages pain but improves daytime sedation. We also discussed pursuing pregabalin in the future.   Follow Up Plan: phone call in 4 weeks  Catie TJodi Mourning, PharmD, [place], [facility] [phone]

## 2023-03-19 NOTE — Assessment & Plan Note (Signed)
Patient with a history of abdominal hernia and nephrolithiasis presenting today for evaluation of abdominal and right flank pain. The pain is located on her right abdomen described as dull and pinching.  Pain is intermittent and usually mild in nature but has been going on for about 3 weeks. Patient was evaluated by her urologist 3 weeks ago and CT renal stone was ordered for further evaluation.  Imaging did not show any urological cause of patient's abdominal pain but did show "Anterior abdominal hernia repair mesh with a moderate-sized fat containing ventral midline hernia along the superior margin of the repair mesh. Two additional fat containing midline abdominal hernias are inferior to the mesh 1 is moderate in size and the other is small". Patient reports she had a ventral hernia repair more than 10 years ago in [place] but does not remember who the surgeon. On exam, there is some tenderness to deep palpation of the hernia but is currently reducible. I am not concerned for strangulation or incarceration of the hernia at this moment but patient will require evaluation by general surgery as soon as possible.  Patient has had a cholecystectomy and imaging did not show any other intra-abdominal pathology to explain her abdominal pain.  Plan: -Urgent referral to general surgery -Advised to go to the ER if abdominal pain worsens, she develops worsening constipation or difficulty with bowel movements.

## 2023-03-19 NOTE — Patient Instructions (Addendum)
Najla,   It was great talking to you today!  Schedule follow up with Dr. Collene Gobble after April 11th. We can recheck your A1c and cholesterol. I'll reach out to him about the Ozempic.   Check your blood pressure periodically, and any time you have concerning symptoms like headache, chest pain, dizziness, shortness of breath, or vision changes.   Our goal is less than 130/80.  To appropriately check your blood pressure, make sure you do the following:  1) Avoid caffeine, exercise, or tobacco products for 30 minutes before checking. Empty your bladder. 2) Sit with your back supported in a flat-backed chair. Rest your arm on something flat (arm of the chair, table, etc). 3) Sit still with your feet flat on the floor, resting, for at least 5 minutes.  4) Check your blood pressure. Take 1-2 readings.  5) Write down these readings and bring with you to any provider appointments.  Bring your home blood pressure machine with you to a provider's office for accuracy comparison at least once a year.   Make sure you take your blood pressure medications before you come to any office visit, even if you were asked to fast for labs.   Take care!  Catie Hedwig Morton, PharmD, [place], [facility] [phone]

## 2023-03-20 ENCOUNTER — Telehealth: Payer: Self-pay

## 2023-03-20 MED ORDER — OZEMPIC (0.25 OR 0.5 MG/DOSE) 2 MG/3ML ~~LOC~~ SOPN: PEN_INJECTOR | SUBCUTANEOUS | 2 refills | Status: DC

## 2023-03-20 NOTE — Telephone Encounter (Signed)
Prior Authorization for patient (ozempic) came through on cover my meds was submitted Per cover my meds "Prior Authorization Not Required"

## 2023-03-20 NOTE — Progress Notes (Signed)
Internal Medicine Clinic Attending  Case discussed with Dr. [place]  At the time of the visit.  We reviewed the resident's history and exam and pertinent patient test results.  I agree with the assessment, diagnosis, and plan of care documented in the resident's note.  

## 2023-03-20 NOTE — Progress Notes (Signed)
Care Coordination Call  Discussed with Dr. Coy Saunas. Discontinue Trulicity, start Ozempic 0.25 mg weekly for 4 weeks then increase to 0.5 mg weekly. Contacted patient and discussed. She verbalizes understanding. Connected to [place] report. Patient agrees to notify me if any low blood sugars so we can discuss reducing lantus dose.   Catie Hedwig Morton, PharmD, [place], [facility] [phone]

## 2023-03-20 NOTE — Addendum Note (Signed)
Addended by: Charise Killian on: 03/20/2023 08:58 AM   Modules accepted: Level of Service

## 2023-03-20 NOTE — Addendum Note (Signed)
Addended by: Kaylyn Layer T on: 03/20/2023 09:00 AM   Modules accepted: Orders

## 2023-03-22 ENCOUNTER — Other Ambulatory Visit: Payer: Self-pay | Admitting: Internal Medicine

## 2023-03-22 DIAGNOSIS — E1159 Type 2 diabetes mellitus with other circulatory complications: Secondary | ICD-10-CM

## 2023-03-22 DIAGNOSIS — I2510 Atherosclerotic heart disease of native coronary artery without angina pectoris: Secondary | ICD-10-CM

## 2023-03-23 ENCOUNTER — Other Ambulatory Visit: Payer: Self-pay | Admitting: Student

## 2023-03-23 ENCOUNTER — Telehealth: Payer: Self-pay

## 2023-03-23 DIAGNOSIS — I152 Hypertension secondary to endocrine disorders: Secondary | ICD-10-CM

## 2023-03-23 DIAGNOSIS — E785 Hyperlipidemia, unspecified: Secondary | ICD-10-CM

## 2023-03-23 MED ORDER — ATORVASTATIN CALCIUM 80 MG PO TABS: 80.0000 mg | ORAL_TABLET | Freq: Every day | ORAL | 2 refills | Status: DC

## 2023-03-23 MED ORDER — AMLODIPINE BESYLATE 5 MG PO TABS: 5.0000 mg | ORAL_TABLET | Freq: Every day | ORAL | 2 refills | Status: DC

## 2023-03-23 NOTE — Telephone Encounter (Signed)
Decision:Denied Milbrey Shagena (Key: [id number]) PA Case ID #: [id number] Rx #: [id number] Need Help? Call us at [phone] Outcome Denied today Denied. Per the health plan's preferred drug list, at least 2 preferred drugs must be tried before requesting this drug or tell us why the member cannot try any preferred alternatives. Please send [place] supporting chart notes and lab results. Here is list of preferred alternatives: Atorvastatin Tablet, Ezetimibe, Lovastatin Tablet, Pravastatin Tablet, Rosuvastatin Tablet, Simvastatin Tablet. Note: Some preferred drug(s) may have quantity limits. Refer to the health plan's preferred drug list for additional details. Drug amLODIPine-Atorvastatin 5-80MG  tablets ePA cloud logo Form [facility] Medicaid of [place] Prior Authorization Request Form [phone] NCPDP) Original Claim Info 5

## 2023-03-23 NOTE — Telephone Encounter (Signed)
Prior Authorization for patient (Amlodipine-Atorvastatin) came through on cover my meds was submitted with last office notes awaiting approval or denial.

## 2023-03-30 DIAGNOSIS — Z419 Encounter for procedure for purposes other than remedying health state, unspecified: Secondary | ICD-10-CM | POA: Diagnosis not present

## 2023-04-01 ENCOUNTER — Other Ambulatory Visit: Payer: Medicaid Other | Admitting: Obstetrics and Gynecology

## 2023-04-01 ENCOUNTER — Encounter: Payer: Self-pay | Admitting: Obstetrics and Gynecology

## 2023-04-01 NOTE — Patient Outreach (Signed)
Medicaid Managed Care   Nurse Care Manager Note  04/01/2023 Name:  TAMKIA GALLICK MRN:  [id number] DOB:  [birth year]  [name] is an 62 y.o. year old female who is a primary patient of [name], MD.  The [facility] Managed Care Coordination team was consulted for assistance with:    Chronic healthcare management needs, arthritis, HLD, RLS, HTN, DM, HF, asthma, COPD, OSA, GERD, CAD, anxiety/depression  Ms. Parmeter was given information about Medicaid Managed Care Coordination team services today. [name] Patient agreed to services and verbal consent obtained.  Engaged with patient by telephone for follow up visit in response to provider referral for case management and/or care coordination services.   Assessments/Interventions:  Review of past medical history, allergies, medications, health status, including review of consultants reports, laboratory and other test data, was performed as part of comprehensive evaluation and provision of chronic care management services.  SDOH (Social Determinants of Health) assessments and interventions performed: SDOH Interventions    Flowsheet Row Patient Outreach Telephone from 04/01/2023 in [place] Patient Outreach Telephone from 03/12/2023 in [place] Patient Outreach Telephone from 03/09/2023 in [place] Counselor from 02/16/2023 in [facility] Patient Outreach Telephone from 02/09/2023 in [place] Patient Outreach Telephone from 02/06/2023 in [place] Interventions        Food Insecurity Interventions -- -- -- -- -- Intervention Not Indicated  Housing Interventions -- -- -- -- -- Intervention Not Indicated  Transportation Interventions -- -- Intervention Not Indicated -- -- --  Utilities Interventions -- -- Intervention Not Indicated -- --  --  Alcohol Usage Interventions Intervention Not Indicated (Score <7) -- -- -- -- --  Depression Interventions/Treatment  -- -- -- Currently on Treatment -- --  Stress Interventions -- [employer], Provide Counseling -- -- [employer], Provide Counseling --     Care Plan  No Known Allergies  Medications Reviewed Today     Reviewed by Gayla Medicus, RN (Registered Nurse) on 04/01/23 at [place] List Status: <None>   Medication Order Taking? Sig Documenting Provider Last Dose Status Informant  Accu-Chek FastClix Lancets MISC [id number] No Check blood sugar two times a day Lorella Nimrod, MD Taking Active Self  acetaminophen (TYLENOL) 500 MG tablet [id number] No Take 1,000 mg by mouth every 6 (six) hours as needed for moderate pain. [provider] Taking Active Self  albuterol (PROVENTIL) (2.5 MG/3ML) 0.083% nebulizer solution [id number] No USE 1 VIAL IN NEBULIZER EVERY 4 HOURS AS NEEDED FOR WHEEZING OR SHORTNESS OF BREATH Amponsah, Charisse March, MD Taking Active Self  amLODipine (NORVASC) 5 MG tablet [id number]  Take 1 tablet (5 mg total) by mouth daily. [name], MD  Active   ASPIRIN ADULT LOW STRENGTH 81 MG EC tablet [id number] No TAKE 1 Tablet BY MOUTH ONCE DAILY  Patient not taking: Reported on 03/19/2023   Lorella Nimrod, MD Not Taking Active Self  atorvastatin (LIPITOR) 80 MG tablet [id number]  Take 1 tablet (80 mg total) by mouth daily. [name], MD  Active   B-D UF III MINI PEN NEEDLES 31G X 5 MM MISC [id number] No USE AS DIRECTED DAILY Angelica Pou, MD Taking Active Self  Blood Glucose Monitoring Suppl (ACCU-CHEK GUIDE) w/Device KIT [id number] No 1 each by Does not apply route 2 (two) times daily. Lorella Nimrod, MD Taking Active Self  busPIRone (BUSPAR) 15 MG tablet [id number] No Take 1 tablet (15 mg total) by mouth 2 (two) times daily. Salley Slaughter, NP Taking Active            Med Note Jodi Mourning,  Tillie Fantasia Mar 19, 2023  2:19 PM) Taking 7.5 mg twice daily  cholecalciferol (VITAMIN D3) 25 MCG (1000 UNIT) tablet [id number] No Take 1,000 Units by mouth daily. [provider] Taking Active Self  clopidogrel (PLAVIX) 75 MG tablet [id number] No TAKE 1 TABLET BY MOUTH IN THE [name], MD Taking Active Self  Continuous Blood Gluc Sensor (FREESTYLE LIBRE 3 SENSOR) [place] [id number] No Place 1 sensor on the skin every 14 days. Use to check glucose continuously [name], MD Taking Active Self  DULoxetine (CYMBALTA) 20 MG capsule [id number] No Take 1 capsule (20 mg total) by mouth daily. Salley Slaughter, NP Taking Active   Evolocumab [facility] SURECLICK) [id number] MG/ML [name] [id number] No INJECT 1 PEN INTO THE SKIN EVERY 14 DAYS Burnell Blanks, MD Taking Active   exemestane (AROMASIN) 25 MG tablet [id number] No TAKE 1 TABLET BY MOUTH ONCE DAILY AFTER Clint Lipps, MD Taking Active Self  ezetimibe (ZETIA) 10 MG tablet [id number] No Take 1 tablet (10 mg total) by mouth daily. [name], PA-C Taking Active Self  ferrous sulfate 325 (65 FE) MG tablet [id number] No Take 1 tablet (325 mg total) by mouth every other day.  Patient not taking: Reported on 03/19/2023   [name], MD Not Taking Active Self  Discontinued 05/09/20 1429   Fluticasone-Umeclidin-Vilant (TRELEGY ELLIPTA) 100-62.5-25 MCG/ACT AEPB [id number] No INHALE 1 PUFF ONCE DAILY Strength: 100-62.5-25 MCG/ACT Lacinda Axon, MD Taking Active Self  furosemide (LASIX) 40 MG tablet [id number] No Take 1 tablet (40 mg total) by mouth as needed for fluid or edema.  Patient not taking: Reported on 03/19/2023   Dorethea Clan, DO Not Taking Active Self  gabapentin (NEURONTIN) 300 MG capsule [id number] No TAKE 2 CAPSULES BY MOUTH IN THE MORNING AND TAKE 3 CAPSULES AT BEDTIME [name], MD Taking Active   glucose blood (ACCU-CHEK GUIDE) test strip [id number] No Check blood sugar 2 times  per day [name], MD Taking Active Self  hydrocortisone cream 1 % [id number] No Apply 1 Application topically daily as needed for itching. [provider] Taking Active Self  LANTUS SOLOSTAR 100 UNIT/ML Solostar Pen [id number] No Inject 30 Units into the skin at bedtime. [provider] Taking Active Self  losartan-hydrochlorothiazide (HYZAAR) 100-25 MG tablet [id number] No Take 1 tablet by mouth daily. [name], MD Taking Active Self  metFORMIN (GLUCOPHAGE-XR) 500 MG 24 hr tablet [id number] No Take 1 tablet (500 mg total) by mouth 2 (two) times daily. [name], MD Taking Active Self  metoprolol succinate (TOPROL-XL) 50 MG 24 hr tablet [id number] No TAKE 1 TABLET BY MOUTH ONCE DAILY WITH MEALS OR  IMMEDIATELY  FOLLOWING  A  MEAL [name], MD Taking Active Self  montelukast (SINGULAIR) 10 MG tablet [id number] No Take 1 tablet (10 mg total) by mouth at bedtime. [name], MD Taking Active Self  nitrofurantoin, macrocrystal-monohydrate, (MACROBID) 100 MG capsule [id number] No Take 1 capsule (100 mg total) by mouth daily. Stoneking, Reece Leader., MD Taking Active Self  ondansetron (ZOFRAN-ODT) 4 MG disintegrating tablet [id number] No Take 1 tablet (4 mg total) by mouth every 8 (eight) hours as needed for nausea or vomiting.  Patient not taking: Reported on 03/19/2023   Raiford Noble  C, PA-C Not Taking Active   phenazopyridine (PYRIDIUM) 200 MG tablet [id number] No Take 1 tablet (200 mg total) by mouth 3 (three) times daily as needed for pain.  Patient not taking: Reported on 03/19/2023   Primus Bravo., MD Not Taking Active   Semaglutide,0.25 or 0.5MG /DOS, (OZEMPIC, 0.25 OR 0.5 MG/DOSE,) 2 MG/3ML SOPN [id number]  Inject 0.25 mg weekly for 4 weeks, then increase to 0.5 mg weekly Lacinda Axon, MD  Active   senna-docusate (SENNA-TIME S) 8.6-50 MG tablet [id number] No Take 1 tablet by mouth at bedtime as needed for mild constipation. [name], MD Taking Active Self  traZODone (DESYREL) 50 MG tablet [id number] No Take 1-2 tablets (50-100 mg total) by mouth at bedtime. Salley Slaughter, NP Taking Active   VENTOLIN HFA 108 (90 Base) MCG/ACT inhaler [id number] No INHALE 2 PUFFS BY MOUTH EVERY 6 HOURS AS NEEDED FOR WHEEZING FOR SHORTNESS OF Loyal Gambler, MD Taking Active Self           Patient Active Problem List   Diagnosis Date Noted   Abdominal hernia 03/19/2023   History of UTI 12/05/2022   Sepsis due to Escherichia coli with encephalopathy and septic shock 11/17/2022   COPD exacerbation 11/16/2022   Diabetic neuropathy 08/29/2022   Vaginal lesion 08/29/2022   PTSD (post-traumatic stress disorder) 06/24/2022   GAD (generalized anxiety disorder) 06/24/2022   Major depressive disorder, recurrent episode, moderate 06/24/2022   Urinary incontinence 06/20/2022   Intertrigo 06/20/2022   Arthritis of left shoulder region    S/P reverse total shoulder arthroplasty, 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   Healthcare maintenance 11/17/2021   Spondylolisthesis, lumbar region 02/11/2021    Class: Acute   Malignant neoplasm of upper-outer quadrant of right breast in female, estrogen receptor positive 06/10/2018   Restless leg syndrome 02/04/2018   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   Depression 08/06/2015   Hypertension associated with diabetes    Aortic stenosis    Chronic diastolic congestive heart failure    COPD (chronic obstructive pulmonary disease) 09/21/2013   Obstructive sleep apnea 09/21/2013   Nephrolithiasis 09/21/2013   Nocturnal hypoxemia 09/12/2013   Hyperlipidemia 11/30/2012   History of pulmonary embolus (PE) 07/09/2012   Normocytic anemia 02/08/2012   Type 2 diabetes mellitus without complication, without long-term current use of insulin 02/08/2012   Asthma 02/08/2012   GERD (gastroesophageal reflux disease)    CAD (coronary artery disease) 12/08/2011   Conditions to be addressed/monitored  per PCP order:  Chronic healthcare management needs, arthritis, HLD, RLS, HTN, DM, HF, asthma, COPD, OSA, GERD, CAD, anxiety/depression  Care Plan : RN Care Manager Plan of Care  Updates made by Gayla Medicus, RN since 04/01/2023 12:00 AM     Problem: Health Promotion or Disease Self-Management (General Plan of Care)   Priority: High  Onset Date: 04/14/2022     Long-Range Goal: Chronic Disease Management and Care Coordination Needs   Start Date: 04/14/2022  Expected End Date: 07/01/2023  Recent Progress: Not on track  Priority: High  Note:   Current Barriers:  Knowledge Deficits related to plan of care for management of Anxiety and Depression along with other chronic health conditions, HTN, DM, HF, asthma, COPD, OSA, arthritis, GERD, CAD, HLD, h/o breast cancer, RLS Chronic Disease Management support and education needs related to Anxiety and Depression. DM, HTN, HF, asthma, OSA, arthritis, GERD, CAD, HLD, h/o breast cancer, RLS. 04/01/23:  Pain right side continues-? Hernia-awaiting surgeon referral-patient will f/u.  Patient with sciatica right side  also.  BP stable at recent PCP appt-does not check.  Blood sugar 146-160.  Started on Ozempic 2 weeks ago.  Wellbutrin discontinued and Cymbalta added-more helpful per patient-continues f/u with counselor, LCSW and Psychiatrist.    RNCM Clinical Goal(s):  Patient will verbalize understanding of plan for management of Anxiety and Depression, DM, HTN  as evidenced by patient report take all medications exactly as prescribed and will call provider for medication related questions as evidenced by patient report demonstrate understanding of rationale for each prescribed medication as evidenced by patient report attend all scheduled medical appointments  as evidenced by patient report continue to work with RN Care Manager to address care management and care coordination needs related to  Anxiety and Depression, DM, HTN as evidenced by adherence to CM Team  Scheduled appointments work with Education officer, museum to address  related to the management of Mental Health Concerns  related to the management of Anxiety and Depression as evidenced by review of EMR and patient or Education officer, museum report through collaboration with Consulting civil engineer, provider, and care team.   Interventions: Inter-disciplinary care team collaboration (see longitudinal plan of care) Evaluation of current treatment plan related to  self management and patient's adherence to plan as established by provider   (Status:  New goal.)  Long Term Goal Evaluation of current treatment plan related to Anxiety and Depression, Mental Health Concerns  self-management and patient's adherence to plan as established by provider. Discussed plans with patient for ongoing care management follow up and provided patient with direct contact information for care management team Advised patient to contact provider for any additional DME needs, CBG, and possible general surgeon referral. Reviewed medications with patient Collaborated with LCSW regarding anxiety/depression Reviewed scheduled/upcoming provider appointments Advised patient, providing education and rationale, to check cbg and record, calling provider  for findings outside established parameters  Social Work referral for anxiety/depression Discussed plans with patient for ongoing care management follow up and provided patient with direct contact information for care management team Assessed social determinant of health barriers Collaborated with [place] Medicaid Liaison regarding DME-motorized wheelchair and lift chair. LCSW appointment rescheduled. Collaborated with Pharmacist. Pharmacy referral for polypharmacy at patient's request-completed  Asthma: (Status:New goal.) Long Term Goal Advised patient to track and manage Asthma triggers Advised patient to engage in light exercise as tolerated 3-5 days a week to aid in the the  management of Asthma Provided education about and advised patient to utilize infection prevention strategies to reduce risk of respiratory infection Discussed the importance of adequate rest and management of fatigue with Asthma Assessed social determinant of health barriers   CAD Interventions: (Status:  New goal.) Long Term Goal Assessed understanding of CAD diagnosis Medications reviewed including medications utilized in CAD treatment plan Provided education on importance of blood pressure control in management of CAD Provided education on Importance of limiting foods high in cholesterol Counseled on importance of regular laboratory monitoring as prescribed Counseled on the importance of exercise goals with target of 150 minutes per week Reviewed Importance of taking all medications as prescribed Reviewed Importance of attending all scheduled provider appointments Advised to report any changes in symptoms or exercise tolerance Assessed social determinant of health barriers  Heart Failure Interventions:  (Status:  New goal.) Long Term Goal Basic overview and discussion of pathophysiology of Heart Failure reviewed Provided education on low sodium diet Assessed need for readable accurate scales in home Provided education about placing scale on hard, flat surface Advised patient to  weigh each morning after emptying bladder Discussed importance of daily weight and advised patient to weigh and record daily Discussed the importance of keeping all appointments with provider Provided patient with education about the role of exercise in the management of heart failure Assessed social determinant of health barriers   COPD Interventions:  (Status:  New goal.) Long Term Goal Advised patient to track and manage COPD triggers Advised patient to self assesses COPD action plan zone and make appointment with provider if in the yellow zone for 48 hours without improvement Advised patient to engage in  light exercise as tolerated 3-5 days a week to aid in the the management of COPD Provided education about and advised patient to utilize infection prevention strategies to reduce risk of respiratory infection Discussed the importance of adequate rest and management of fatigue with COPD Assessed social determinant of health barriers  Hyperlipidemia Interventions:  (Status:  New goal.) Long Term Goal Medication review performed; medication list updated in electronic medical record.  Provider established cholesterol goals reviewed Counseled on importance of regular laboratory monitoring as prescribed Reviewed importance of limiting foods high in cholesterol Reviewed exercise goals and target of 150 minutes per week Assessed social determinant of health barriers    Diabetes Interventions:  (Status:  New goal.) Long Term Goal Assessed patient's understanding of A1c goal: <7% Reviewed medications with patient and discussed importance of medication adherence Reviewed scheduled/upcoming provider appointments  Advised patient, providing education and rationale, to check cbg and record, calling provider for findings outside established parameters Review of patient status, including review of consultants reports, relevant laboratory and other test results, and medications completed Assessed social determinant of health barriers Lab Results  Component Value Date   HGBA1C 7.1 (A) 08/27/2022  HGB A1C  6.6 on 01/08/23  Hypertension Interventions:  (Status:  New goal.) Long Term Goal Last practice recorded BP readings:  BP Readings from Last 3 Encounters:  09/12/22 133/69  08/27/22 (!) 109/58  07/01/22 137/75  12/25/22          124/60 03/18/23         130/50  Most recent eGFR/CrCl:  Lab Results  Component Value Date   EGFR 90 06/04/2022    No components found for: "CRCL"  Evaluation of current treatment plan related to hypertension self management and patient's adherence to plan as established  by provider Reviewed medications with patient and discussed importance of compliance Discussed plans with patient for ongoing care management follow up and provided patient with direct contact information for care management team Advised patient, providing education and rationale, to monitor blood pressure daily and record, calling PCP for findings outside established parameters Reviewed scheduled/upcoming provider appointments including:  Assessed social determinant of health barriers   Patient Goals/Self-Care Activities: Take all medications as prescribed Attend all scheduled provider appointments Call pharmacy for medication refills 3-7 days in advance of running out of medications Perform all self care activities independently  Perform IADL's (shopping, preparing meals, housekeeping, managing finances) independently Call provider office for new concerns or questions  Work with the social worker to address care coordination needs and will continue to work with the clinical team to address health care and disease management related needs  Follow Up Plan:  The patient has been provided with contact information for the care management team and has been advised to call with any health related questions or concerns.  The care management team will reach out to the patient again over the next 60 business  days.  Long-Range Goal: Establish Plan of Care for Chronic Disease Management Needs   Priority: High  Note:   Timeframe:  Long-Range Goal Priority:  High Start Date:      04/14/22                       Expected End Date:     ongoing                  Follow Up Date 05/12/23   - schedule appointment for flu shot - schedule appointment for vaccines needed due to my age or health - schedule recommended health tests (blood work, mammogram, colonoscopy, pap test) - schedule and keep appointment for annual check-up    Why is this important?   Screening tests can find diseases early when they  are easier to treat.  Your doctor or nurse will talk with you about which tests are important for you.  Getting shots for common diseases like the flu and shingles will help prevent them.  04/01/23:  waiting on surgeon referral -patient to f/u   Follow Up:  Patient agrees to Care Plan and Follow-up.  Plan: The Managed Medicaid care management team will reach out to the patient again over the next 60 business  days. and The  Patient has been provided with contact information for the Managed Medicaid care management team and has been advised to call with any health related questions or concerns.  Date/time of next scheduled RN care management/care coordination outreach: 05/12/23 at 1030.

## 2023-04-01 NOTE — Patient Instructions (Signed)
Hi [name], nice to speak with you this afternoon-enjoy your quiche and strawberry lemonade that you made!!  [name] was given information about Medicaid Managed Care team care coordination services as a part of their [facility] Medicaid benefit. [name] verbally consented to engagement with the [facility] Managed Care team.   If you are experiencing a medical emergency, please call 911 or report to your local emergency department or urgent care.   If you have a non-emergency medical problem during routine business hours, please contact your provider's office and ask to speak with a nurse.   For questions related to your [facility] health plan, please call: [phone] or go here:[email or web address]  If you would like to schedule transportation through your [facility] plan, please call the following number at least 2 days in advance of your appointment: [phone].  You can also use the MTM portal or MTM mobile app to manage your rides. For the portal, please go to [email or web address].  Call the [place] at [phone], at any time, 24 hours a day, 7 days a week. If you are in danger or need immediate medical attention call 911.  If you would like help to quit smoking, call 1-800-QUIT-NOW 662 705 3320) OR Espaol: 1-855-Djelo-Ya HD:1601594) o para ms informacin haga clic aqu or Text READY to 200-400 to register via text  [name] - following are the goals we discussed in your visit today:   Goals Addressed    Timeframe:  Long-Range Goal Priority:  High Start Date:      04/14/22                       Expected End Date:     ongoing                  Follow Up Date 05/12/23   - schedule appointment for flu shot - schedule appointment for vaccines needed due to my age or health - schedule recommended health tests (blood work, mammogram, colonoscopy, pap test) - schedule and keep appointment for annual check-up    Why  is this important?   Screening tests can find diseases early when they are easier to treat.  Your doctor or nurse will talk with you about which tests are important for you.  Getting shots for common diseases like the flu and shingles will help prevent them.  04/01/23:  waiting on surgeon referral -patient to f/u  Patient verbalizes understanding of instructions and care plan provided today and agrees to view in [place]. Active MyChart status and patient understanding of how to access instructions and care plan via MyChart confirmed with patient.     The Managed Medicaid care management team will reach out to the patient again over the next 60 business  days.  The  Patient  has been provided with contact information for the Managed Medicaid care management team and has been advised to call with any health related questions or concerns.   Aida Raider RN, BSN [place] Management Coordinator - Managed Medicaid High Risk [phone]   Following is a copy of your plan of care:  Care Plan : [name] of Care  Updates made by Gayla Medicus, RN since 04/01/2023 12:00 AM     Problem: Health Promotion or Disease Self-Management (General Plan of Care)   Priority: High  Onset Date: 04/14/2022     Long-Range Goal: Chronic Disease Management and Care Coordination  Needs   Start Date: 04/14/2022  Expected End Date: 07/01/2023  Recent Progress: Not on track  Priority: High  Note:   Current Barriers:  Knowledge Deficits related to plan of care for management of Anxiety and Depression along with other chronic health conditions, HTN, DM, HF, asthma, COPD, OSA, arthritis, GERD, CAD, HLD, h/o breast cancer, RLS Chronic Disease Management support and education needs related to Anxiety and Depression. DM, HTN, HF, asthma, OSA, arthritis, GERD, CAD, HLD, h/o breast cancer, RLS. 04/01/23:  Pain right side continues-? Hernia-awaiting surgeon referral-patient will f/u.   Patient with sciatica right side also.  BP stable at recent PCP appt-does not check.  Blood sugar 146-160.  Started on Ozempic 2 weeks ago.  Wellbutrin discontinued and Cymbalta added-more helpful per patient-continues f/u with counselor, LCSW and Psychiatrist.    RNCM Clinical Goal(s):  Patient will verbalize understanding of plan for management of Anxiety and Depression, DM, HTN  as evidenced by patient report take all medications exactly as prescribed and will call provider for medication related questions as evidenced by patient report demonstrate understanding of rationale for each prescribed medication as evidenced by patient report attend all scheduled medical appointments  as evidenced by patient report continue to work with RN Care Manager to address care management and care coordination needs related to  Anxiety and Depression, DM, HTN as evidenced by adherence to CM Team Scheduled appointments work with Education officer, museum to address  related to the management of Mental Health Concerns  related to the management of Anxiety and Depression as evidenced by review of EMR and patient or Education officer, museum report through collaboration with Consulting civil engineer, provider, and care team.   Interventions: Inter-disciplinary care team collaboration (see longitudinal plan of care) Evaluation of current treatment plan related to  self management and patient's adherence to plan as established by provider   (Status:  New goal.)  Long Term Goal Evaluation of current treatment plan related to Anxiety and Depression, Mental Health Concerns  self-management and patient's adherence to plan as established by provider. Discussed plans with patient for ongoing care management follow up and provided patient with direct contact information for care management team Advised patient to contact provider for any additional DME needs, CBG, and possible general surgeon referral. Reviewed medications with patient Collaborated with  LCSW regarding anxiety/depression Reviewed scheduled/upcoming provider appointments Advised patient, providing education and rationale, to check cbg and record, calling provider  for findings outside established parameters  Social Work referral for anxiety/depression Discussed plans with patient for ongoing care management follow up and provided patient with direct contact information for care management team Assessed social determinant of health barriers Collaborated with [place] Medicaid Liaison regarding DME-motorized wheelchair and lift chair. LCSW appointment rescheduled. Collaborated with Pharmacist. Pharmacy referral for polypharmacy at patient's request-completed  Asthma: (Status:New goal.) Long Term Goal Advised patient to track and manage Asthma triggers Advised patient to engage in light exercise as tolerated 3-5 days a week to aid in the the management of Asthma Provided education about and advised patient to utilize infection prevention strategies to reduce risk of respiratory infection Discussed the importance of adequate rest and management of fatigue with Asthma Assessed social determinant of health barriers   CAD Interventions: (Status:  New goal.) Long Term Goal Assessed understanding of CAD diagnosis Medications reviewed including medications utilized in CAD treatment plan Provided education on importance of blood pressure control in management of CAD Provided education on Importance of limiting foods high in cholesterol Counseled  on importance of regular laboratory monitoring as prescribed Counseled on the importance of exercise goals with target of 150 minutes per week Reviewed Importance of taking all medications as prescribed Reviewed Importance of attending all scheduled provider appointments Advised to report any changes in symptoms or exercise tolerance Assessed social determinant of health barriers  Heart Failure Interventions:  (Status:   New goal.) Long Term Goal Basic overview and discussion of pathophysiology of Heart Failure reviewed Provided education on low sodium diet Assessed need for readable accurate scales in home Provided education about placing scale on hard, flat surface Advised patient to weigh each morning after emptying bladder Discussed importance of daily weight and advised patient to weigh and record daily Discussed the importance of keeping all appointments with provider Provided patient with education about the role of exercise in the management of heart failure Assessed social determinant of health barriers   COPD Interventions:  (Status:  New goal.) Long Term Goal Advised patient to track and manage COPD triggers Advised patient to self assesses COPD action plan zone and make appointment with provider if in the yellow zone for 48 hours without improvement Advised patient to engage in light exercise as tolerated 3-5 days a week to aid in the the management of COPD Provided education about and advised patient to utilize infection prevention strategies to reduce risk of respiratory infection Discussed the importance of adequate rest and management of fatigue with COPD Assessed social determinant of health barriers  Hyperlipidemia Interventions:  (Status:  New goal.) Long Term Goal Medication review performed; medication list updated in electronic medical record.  Provider established cholesterol goals reviewed Counseled on importance of regular laboratory monitoring as prescribed Reviewed importance of limiting foods high in cholesterol Reviewed exercise goals and target of 150 minutes per week Assessed social determinant of health barriers    Diabetes Interventions:  (Status:  New goal.) Long Term Goal Assessed patient's understanding of A1c goal: <7% Reviewed medications with patient and discussed importance of medication adherence Reviewed scheduled/upcoming provider appointments  Advised  patient, providing education and rationale, to check cbg and record, calling provider for findings outside established parameters Review of patient status, including review of consultants reports, relevant laboratory and other test results, and medications completed Assessed social determinant of health barriers Lab Results  Component Value Date   HGBA1C 7.1 (A) 08/27/2022  HGB A1C  6.6 on 01/08/23  Hypertension Interventions:  (Status:  New goal.) Long Term Goal Last practice recorded BP readings:  BP Readings from Last 3 Encounters:  09/12/22 133/69  08/27/22 (!) 109/58  07/01/22 137/75  12/25/22          124/60 03/18/23         130/50  Most recent eGFR/CrCl:  Lab Results  Component Value Date   EGFR 90 06/04/2022    No components found for: "CRCL"  Evaluation of current treatment plan related to hypertension self management and patient's adherence to plan as established by provider Reviewed medications with patient and discussed importance of compliance Discussed plans with patient for ongoing care management follow up and provided patient with direct contact information for care management team Advised patient, providing education and rationale, to monitor blood pressure daily and record, calling PCP for findings outside established parameters Reviewed scheduled/upcoming provider appointments including:  Assessed social determinant of health barriers   Patient Goals/Self-Care Activities: Take all medications as prescribed Attend all scheduled provider appointments Call pharmacy for medication refills 3-7 days in advance of running out of medications Perform  all self care activities independently  Perform IADL's (shopping, preparing meals, housekeeping, managing finances) independently Call provider office for new concerns or questions  Work with the social worker to address care coordination needs and will continue to work with the clinical team to address health care and  disease management related needs  Follow Up Plan:  The patient has been provided with contact information for the care management team and has been advised to call with any health related questions or concerns.  The care management team will reach out to the patient again over the next 60 business  days.

## 2023-04-02 ENCOUNTER — Ambulatory Visit (INDEPENDENT_AMBULATORY_CARE_PROVIDER_SITE_OTHER): Payer: Medicaid Other | Admitting: Licensed Clinical Social Worker

## 2023-04-02 DIAGNOSIS — F4310 Post-traumatic stress disorder, unspecified: Secondary | ICD-10-CM | POA: Diagnosis not present

## 2023-04-02 DIAGNOSIS — F411 Generalized anxiety disorder: Secondary | ICD-10-CM

## 2023-04-02 DIAGNOSIS — F331 Major depressive disorder, recurrent, moderate: Secondary | ICD-10-CM
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
## 2023-04-05 ENCOUNTER — Other Ambulatory Visit: Payer: Self-pay | Admitting: Student

## 2023-04-05 DIAGNOSIS — E1142 Type 2 diabetes mellitus with diabetic polyneuropathy: Secondary | ICD-10-CM

## 2023-04-05 DIAGNOSIS — E1159 Type 2 diabetes mellitus with other circulatory complications: Secondary | ICD-10-CM

## 2023-04-17 DIAGNOSIS — K432 Incisional hernia without obstruction or gangrene: Secondary | ICD-10-CM | POA: Diagnosis not present

## 2023-04-19 ENCOUNTER — Other Ambulatory Visit: Payer: Self-pay | Admitting: Physician Assistant

## 2023-04-19 ENCOUNTER — Other Ambulatory Visit: Payer: Self-pay | Admitting: Student

## 2023-04-19 DIAGNOSIS — I350 Nonrheumatic aortic (valve) stenosis: Secondary | ICD-10-CM

## 2023-04-19 DIAGNOSIS — I2510 Atherosclerotic heart disease of native coronary artery without angina pectoris: Secondary | ICD-10-CM

## 2023-04-21 ENCOUNTER — Encounter (HOSPITAL_COMMUNITY): Payer: Self-pay

## 2023-04-21 ENCOUNTER — Ambulatory Visit (HOSPITAL_COMMUNITY): Payer: Medicaid Other | Admitting: Licensed Clinical Social Worker

## 2023-04-29 ENCOUNTER — Telehealth (INDEPENDENT_AMBULATORY_CARE_PROVIDER_SITE_OTHER): Payer: Medicaid Other | Admitting: Psychiatry

## 2023-04-29 ENCOUNTER — Encounter (HOSPITAL_COMMUNITY): Payer: Self-pay | Admitting: Psychiatry

## 2023-04-29 DIAGNOSIS — F411 Generalized anxiety disorder: Secondary | ICD-10-CM

## 2023-04-29 DIAGNOSIS — F4310 Post-traumatic stress disorder, unspecified: Secondary | ICD-10-CM | POA: Diagnosis not present

## 2023-04-29 DIAGNOSIS — F32A Depression, unspecified: Secondary | ICD-10-CM | POA: Diagnosis not present

## 2023-04-29 DIAGNOSIS — Z419 Encounter for procedure for purposes other than remedying health state, unspecified: Secondary | ICD-10-CM | POA: Diagnosis not present

## 2023-04-29 MED ORDER — DULOXETINE HCL 20 MG PO CPEP
20.0000 mg | ORAL_CAPSULE | Freq: Every day | ORAL | 3 refills | Status: DC
Start: 2023-04-29 — End: 2023-07-08

## 2023-04-29 MED ORDER — TRAZODONE HCL 50 MG PO TABS
50.0000 mg | ORAL_TABLET | Freq: Every day | ORAL | 3 refills | Status: DC
Start: 2023-04-29 — End: 2023-07-08

## 2023-04-29 MED ORDER — BUSPIRONE HCL 15 MG PO TABS
15.0000 mg | ORAL_TABLET | Freq: Two times a day (BID) | ORAL | 3 refills | Status: DC
Start: 2023-04-29 — End: 2023-07-08

## 2023-04-29 NOTE — Progress Notes (Signed)
BH MD/PA/NP OP Progress Note Virtual Visit via Video Note  I connected with [name] on 04/29/23 at 10:30 AM EDT by a video enabled telemedicine application and verified that I am speaking with the correct person using two identifiers.  Location: Patient: [facility] Provider: Clinic   I discussed the limitations of evaluation and management by telemedicine and the availability of in person appointments. The patient expressed understanding and agreed to proceed.  I provided 30 minutes of non-face-to-face time during this encounter.   04/29/2023 10:52 AM [name]  MRN:  [id number]  Chief Complaint: "I am feeling really good. Im happy"  HPI:  62 year old female seen today for follow-up psychiatric evaluation.  She has a psychiatric history of anxiety, depression, and PTSD.  Currently she is being managed on BuSpar 15 mg 3 times daily (noting that she takes it twice daily), Cymbalta 20 mg daily, and trazodone 50 to 100 mg nightly as needed.  She notes the medications are effective in managing her psychiatric condition.   Today she was well-groomed, pleasant, cooperative, and engaged in conversation.  She informed Clinical research associate that she feels pretty good. She notes that she computable and happy.  Since her last visit her anxiety and depression has improved.  Today provider conducted GAD-7 and patient scored a 4, at her last visit she scored a 6.  Provider also conducted PHQ-9 the patient scored a 0, at her last visit she scored a 6.  She endorses adequate sleep and appetite.  Today she denies SI/HI/AVH, mania, paranoia.    Patient informed writer that she continues to have back and leg pains due to sciatica.  She informed Clinical research associate that Tylenol and ice packs on her feet has been somewhat effective in managing her pain.  Today she quantifies her pain as 2 out of 10.    No medication changes made today.  Patient agreeable to continue medication as prescribed.  No other concerns noted at this  time.     Visit Diagnosis:    ICD-10-CM   1. GAD (generalized anxiety disorder)  F41.1 busPIRone (BUSPAR) 15 MG tablet    DULoxetine (CYMBALTA) 20 MG capsule    traZODone (DESYREL) 50 MG tablet    2. PTSD (post-traumatic stress disorder)  F43.10 busPIRone (BUSPAR) 15 MG tablet    traZODone (DESYREL) 50 MG tablet    3. Mild depression  F32.A busPIRone (BUSPAR) 15 MG tablet    DULoxetine (CYMBALTA) 20 MG capsule    traZODone (DESYREL) 50 MG tablet      Past Psychiatric History: anxiety, depression, and PTSD  Past Medical History:  Past Medical History:  Diagnosis Date   Anemia    Anxiety    Aortic valve stenosis, severe    Arthritis    PAIN AND SEVERE OA LEFT KNEE ; S/P RIGHT TKA ON 02/03/12; HAS LOWER BACK PAIN-UNABLE TO STAND MORE THAN 10 MIN; ARTHRITIS "ALL OVER"   Asthma    Blood transfusion    2013[facility]   Breast cancer in female [facility])    Right   CAD (coronary artery disease)    Cath 2010 with DES x 1 RCA-- PT'S CARDIOLOGIST IS DR. MCALHANY   Chronic diastolic congestive heart failure (HCC)    COPD (chronic obstructive pulmonary disease) (HCC)    Depression    Diabetes mellitus DIAGNOSED IN2010   Dyspnea    with much ambulation   Eczema    on back   Headache    migraines younger- rare now 02/07/21  Heart murmur    no current problems   History of hiatal hernia    History of kidney stones    passed or blasted   Hyperlipidemia    Hypertension    Morbid obesity with body mass index of 60.0-69.9 in adult [facility])    Myocardial infarction (HCC)    PT THINKS SHE WAS DX WITH MI AT THE TIME OF HEART STENTING   Neuromuscular disorder (HCC)    bilateral arm/hands   Oxygen dependent    uses 3L oxygen night/prn   Personal history of radiation therapy    Pneumonia    Pulmonary embolism (HCC) 02/08/2012   S/P RT TOTAL KNEE ON 02/03/12--ON 02/08/12--DEVELOPED ACUTE SOB AND CHEST PAIN--AND DIAGNOSED WITH  PULMONARY EMBOLUS AND PNEUMONIA   Restless leg syndrome    Sleep apnea     uses 3 liters O2 at night as needed   Uterine fibroid    NO PROBLEMS AT PRESENT FROM THE FIBROIDS-STATES SHE IS POST MENOPAUSAL-LAST MENSES 2010 EXCEPT FOR EPISODE THIS YR OF BLEEDING RELATED TO FIBROIDS.   Weakness    BOTH HANDS - S/P BILATERAL CARPAL TUNNEL RELEASE--BUT STILL HAS WEAKNESS--OFTEN DROPS THINGS    Past Surgical History:  Procedure Laterality Date   BACK SURGERY  02/11/2021   Dr Otelia Sergeant   BREAST BIOPSY Right 06/04/2018   BREAST LUMPECTOMY Right 06/2018   BREAST LUMPECTOMY WITH RADIOACTIVE SEED AND SENTINEL LYMPH NODE BIOPSY Right 07/19/2018   Procedure: BREAST LUMPECTOMY WITH RADIOACTIVE SEED AND SENTINEL LYMPH NODE BIOPSY;  Surgeon: Ovidio Kin, MD;  [facility];  Service: General;  Laterality: Right;   CARDIAC CATHETERIZATION     CARDIAC VALVE REPLACEMENT     2017   CARPAL TUNNEL RELEASE     Bilateral   CHOLECYSTECTOMY     COLONOSCOPY     CORONARY ANGIOPLASTY     2010 has stent in place   CYSTOSCOPY W/ RETROGRADES Right 09/21/2013   Procedure: CYSTOSCOPY WITH RIGHT RETROGRADE PYELOGRAM RIGHT DOUBLE J STENT ;  Surgeon: Antony Haste, MD;  Location: [facility] ORS;  Service: Urology;  Laterality: Right;   CYSTOSCOPY W/ URETERAL STENT PLACEMENT Right 11/16/2022   Procedure: CYSTOSCOPY WITH RETROGRADE PYELOGRAM/URETERAL STENT PLACEMENT;  Surgeon: Despina Arias, MD;  [facility] OR;  Service: Urology;  Laterality: Right;   CYSTOSCOPY WITH RETROGRADE PYELOGRAM, URETEROSCOPY AND STENT PLACEMENT Right 01/13/2023   Procedure: CYSTOSCOPY WITH RETROGRADE PYELOGRAM, URETEROSCOPY AND STENT EXCHANGE;  Surgeon: Milderd Meager., MD;  Location: [facility];  Service: Urology;  Laterality: Right;   CYSTOSCOPY WITH URETEROSCOPY AND STENT PLACEMENT Right 10/25/2013   Procedure: CYSTOSCOPY RIGHT URETEROSCOPY HOLMIUM LASER LITHO AND STENT PLACEMENT;  Surgeon: Antony Haste, MD;  Location: [facility];  Service: Urology;  Laterality: Right;   HERNIA REPAIR  1995    umbilical ,   HOLMIUM LASER APPLICATION Right 01/13/2023   Procedure: HOLMIUM LASER APPLICATION;  Surgeon: Milderd Meager., MD;  Location: [facility];  Service: Urology;  Laterality: Right;   INTRAOPERATIVE TRANSESOPHAGEAL ECHOCARDIOGRAM N/A 12/12/2014   Procedure: INTRAOPERATIVE TRANSESOPHAGEAL ECHOCARDIOGRAM;  Surgeon: Kathleene Hazel, MD;  Location: [facility] OR;  Service: Cardiovascular;  Laterality: N/A;   JOINT REPLACEMENT     bil total knees   KNEE ARTHROPLASTY  02/03/2012   Procedure: COMPUTER ASSISTED TOTAL KNEE ARTHROPLASTY;  Surgeon: [name] Hitch, MD;  [facility];  Service: Orthopedics;  Laterality: Right;  Right total knee arthroplasty   LEFT AND RIGHT HEART CATHETERIZATION WITH CORONARY ANGIOGRAM N/A 03/17/2013  Procedure: LEFT AND RIGHT HEART CATHETERIZATION WITH CORONARY ANGIOGRAM;  Surgeon: Kathleene Hazel, MD;  Location: [facility] CATH LAB;  Service: Cardiovascular;  Laterality: N/A;   LEFT AND RIGHT HEART CATHETERIZATION WITH CORONARY/GRAFT ANGIOGRAM N/A 09/14/2014   Procedure: LEFT AND RIGHT HEART CATHETERIZATION WITH Isabel Caprice;  Surgeon: Kathleene Hazel, MD;  Location: [facility] CATH LAB;  Service: Cardiovascular;  Laterality: N/A;   REVERSE SHOULDER ARTHROPLASTY Left 03/06/2022   Procedure: LEFT SHOULDER REPLACEMENT APPLICATION OF WOUND VAC;  Surgeon: Cammy Copa, MD;  [facility];  Service: Orthopedics;  Laterality: Left;   TEE WITHOUT CARDIOVERSION N/A 03/14/2013   Procedure: TRANSESOPHAGEAL ECHOCARDIOGRAM (TEE);  Surgeon: Lewayne Bunting, MD;  Location: [facility] ENDOSCOPY;  Service: Cardiovascular;  Laterality: N/A;   TEE WITHOUT CARDIOVERSION N/A 11/14/2014   Procedure: TRANSESOPHAGEAL ECHOCARDIOGRAM (TEE);  Surgeon: Lewayne Bunting, MD;  Location: [facility] ENDOSCOPY;  Service: Cardiovascular;  Laterality: N/A;   TONSILLECTOMY     maybe as a child- does not know   TOTAL KNEE ARTHROPLASTY  09/10/2012   Procedure: TOTAL KNEE ARTHROPLASTY;   Surgeon: [name] Hitch, MD;  [facility];  Service: Orthopedics;  Laterality: Left;   TOTAL KNEE REVISION Right 07/15/2013   Procedure: REVISION ARTHROPLASTY RIGHT KNEE;  Surgeon: [name] Hitch, MD;  [facility];  Service: Orthopedics;  Laterality: Right;   TRANSCATHETER AORTIC VALVE REPLACEMENT, TRANSFEMORAL N/A 12/12/2014   Procedure: TRANSCATHETER AORTIC VALVE REPLACEMENT, TRANSFEMORAL;  Surgeon: Kathleene Hazel, MD;  [facility];  Service: Cardiovascular;  Laterality: N/A;   TRIGGER FINGER RELEASE  09/10/2012   Procedure: RELEASE TRIGGER FINGER/A-1 PULLEY;  Surgeon: [name] Hitch, MD;  Location[facility];  Service: Orthopedics;  Laterality: Right;  Right Ring Finger   TUBAL LIGATION      Family Psychiatric History: Mother alcohol use and brother intellectual delay   Family History:  Family History  Problem Relation Age of Onset   Breast cancer Mother        stage IV at diagnosis   Emphysema Mother        smoked   Heart disease Mother    COPD Father        smoked   Asthma Father    Heart disease Father    Cancer Brother        Sinus    Social History:  Social History   Socioeconomic History   Marital status: Married    Spouse name: Not on file   Number of children: 2   Years of education: Not on file   Highest education level: Not on file  Occupational History   Occupation: Disabled  Tobacco Use   Smoking status: Former    Packs/day: 1.50    Years: 30.00    Additional pack years: 0.00    Total pack years: 45.00    Types: Cigarettes    Quit date: 12/29/2000    Years since quitting: 22.3   Smokeless tobacco: Never  Vaping Use   Vaping Use: Never used  Substance and Sexual Activity   Alcohol use: Not Currently   Drug use: Not Currently    Types: Marijuana    Comment: CBD oils through vape shops   Sexual activity: Not Currently    Birth control/protection: Surgical, Post-menopausal    Comment: tubal ligation  Other  Topics Concern   Not on file  Social History Narrative   Not on file   Social Determinants of Health   Financial Resource Strain: Low Risk  (  12/08/2022)   Overall Financial Resource Strain (CARDIA)    Difficulty of Paying Living Expenses: Not very hard  Food Insecurity: No Food Insecurity (02/06/2023)   Hunger Vital Sign    Worried About Running Out of Food in the Last Year: Never true    Ran Out of Food in the Last Year: Never true  Transportation Needs: No Transportation Needs (03/09/2023)   PRAPARE - Administrator, Civil Service (Medical): No    Lack of Transportation (Non-Medical): No  Physical Activity: Inactive (01/09/2023)   Exercise Vital Sign    Days of Exercise per Week: 0 days    Minutes of Exercise per Session: 0 min  Stress: Stress Concern Present (03/12/2023)   [employer] of Occupational Health - Occupational Stress Questionnaire    Feeling of Stress : Rather much  Social Connections: Moderately Isolated (01/09/2023)   Social Connection and Isolation Panel [NHANES]    Frequency of Communication with Friends and Family: More than three times a week    Frequency of Social Gatherings with Friends and Family: More than three times a week    Attends Religious Services: Never    Database administrator or Organizations: No    Attends Engineer, structural: Never    Marital Status: Married    Allergies: No Known Allergies  Metabolic Disorder Labs: Lab Results  Component Value Date   HGBA1C 6.6 (H) 01/08/2023   MPG 143 01/08/2023   MPG 182.9 02/14/2022   No results found for: "PROLACTIN" Lab Results  Component Value Date   CHOL 165 12/25/2022   TRIG 78 12/25/2022   HDL 49 12/25/2022   CHOLHDL 3.4 12/25/2022   VLDL 20 12/28/2015   LDLCALC 101 (H) 12/25/2022   LDLCALC 87 06/04/2022   Lab Results  Component Value Date   TSH 2.820 09/25/2020   TSH 2.918 09/21/2013    Therapeutic Level Labs: No results found for: "LITHIUM" No results  found for: "VALPROATE" No results found for: "CBMZ"  Current Medications: Current Outpatient Medications  Medication Sig Dispense Refill   Accu-Chek FastClix Lancets MISC Check blood sugar two times a day 102 each 9   acetaminophen (TYLENOL) 500 MG tablet Take 1,000 mg by mouth every 6 (six) hours as needed for moderate pain.     albuterol (PROVENTIL) (2.5 MG/3ML) 0.083% nebulizer solution USE 1 VIAL IN NEBULIZER EVERY 4 HOURS AS NEEDED FOR WHEEZING OR SHORTNESS OF BREATH 150 mL 1   amLODipine (NORVASC) 5 MG tablet Take 1 tablet (5 mg total) by mouth daily. 90 tablet 2   ASPIRIN ADULT LOW STRENGTH 81 MG EC tablet TAKE 1 Tablet BY MOUTH ONCE DAILY (Patient not taking: Reported on 03/19/2023) 90 tablet 3   atorvastatin (LIPITOR) 80 MG tablet Take 1 tablet (80 mg total) by mouth daily. 90 tablet 2   B-D UF III MINI PEN NEEDLES 31G X 5 MM MISC USE AS DIRECTED DAILY 100 each 5   Blood Glucose Monitoring Suppl (ACCU-CHEK GUIDE) w/Device KIT 1 each by Does not apply route 2 (two) times daily. 1 kit 1   busPIRone (BUSPAR) 15 MG tablet Take 1 tablet (15 mg total) by mouth 2 (two) times daily. 60 tablet 3   cholecalciferol (VITAMIN D3) 25 MCG (1000 UNIT) tablet Take 1,000 Units by mouth daily.     clopidogrel (PLAVIX) 75 MG tablet TAKE 1 TABLET BY MOUTH IN THE MORNING 90 tablet 2   Continuous Blood Gluc Sensor (FREESTYLE LIBRE 3 SENSOR) MISC Place  1 sensor on the skin every 14 days. Use to check glucose continuously 2 each 11   DULoxetine (CYMBALTA) 20 MG capsule Take 1 capsule (20 mg total) by mouth daily. 30 capsule 3   Evolocumab (REPATHA SURECLICK) 140 MG/ML SOAJ INJECT 1 PEN INTO THE SKIN EVERY 14 DAYS 6 mL 3   exemestane (AROMASIN) 25 MG tablet TAKE 1 TABLET BY MOUTH ONCE DAILY AFTER BREAKFAST 90 tablet 3   ezetimibe (ZETIA) 10 MG tablet Take 1 tablet by mouth once daily 90 tablet 2   ferrous sulfate 325 (65 FE) MG tablet Take 1 tablet (325 mg total) by mouth every other day. (Patient not taking:  Reported on 03/19/2023) 30 tablet 3   Fluticasone-Umeclidin-Vilant (TRELEGY ELLIPTA) 100-62.5-25 MCG/ACT AEPB INHALE 1 PUFF ONCE DAILY Strength: 100-62.5-25 MCG/ACT 180 each 5   furosemide (LASIX) 40 MG tablet Take 1 tablet (40 mg total) by mouth as needed for fluid or edema. (Patient not taking: Reported on 03/19/2023) 180 tablet 1   gabapentin (NEURONTIN) 300 MG capsule TAKE 2 CAPSULES BY MOUTH IN THE MORNING AND 3 CAPSULES AT BEDTIME 150 capsule 0   glucose blood (ACCU-CHEK GUIDE) test strip Check blood sugar 2 times per day 100 each 9   hydrocortisone cream 1 % Apply 1 Application topically daily as needed for itching.     LANTUS SOLOSTAR 100 UNIT/ML Solostar Pen Inject 30 Units into the skin at bedtime.     losartan-hydrochlorothiazide (HYZAAR) 100-25 MG tablet Take 1 tablet by mouth once daily 90 tablet 0   metFORMIN (GLUCOPHAGE-XR) 500 MG 24 hr tablet Take 1 tablet (500 mg total) by mouth 2 (two) times daily. 90 tablet 2   metoprolol succinate (TOPROL-XL) 50 MG 24 hr tablet TAKE 1 TABLET BY MOUTH ONCE DAILY WITH MEALS OR  IMMEDIATELY  FOLLOWING  A  MEAL 90 tablet 2   montelukast (SINGULAIR) 10 MG tablet Take 1 tablet (10 mg total) by mouth at bedtime. 90 tablet 2   nitrofurantoin, macrocrystal-monohydrate, (MACROBID) 100 MG capsule Take 1 capsule (100 mg total) by mouth daily. 30 capsule 2   ondansetron (ZOFRAN-ODT) 4 MG disintegrating tablet Take 1 tablet (4 mg total) by mouth every 8 (eight) hours as needed for nausea or vomiting. (Patient not taking: Reported on 03/19/2023) 20 tablet 0   phenazopyridine (PYRIDIUM) 200 MG tablet Take 1 tablet (200 mg total) by mouth 3 (three) times daily as needed for pain. (Patient not taking: Reported on 03/19/2023) 20 tablet 1   Semaglutide,0.25 or 0.5MG /DOS, (OZEMPIC, 0.25 OR 0.5 MG/DOSE,) 2 MG/3ML SOPN Inject 0.25 mg weekly for 4 weeks, then increase to 0.5 mg weekly 3 mL 2   senna-docusate (SENNA-TIME S) 8.6-50 MG tablet Take 1 tablet by mouth at bedtime as  needed for mild constipation. 30 tablet 1   traZODone (DESYREL) 50 MG tablet Take 1-2 tablets (50-100 mg total) by mouth at bedtime. 30 tablet 3   VENTOLIN HFA 108 (90 Base) MCG/ACT inhaler INHALE 2 PUFFS BY MOUTH EVERY 6 HOURS AS NEEDED FOR WHEEZING FOR SHORTNESS OF BREATH 18 g 3   No current facility-administered medications for this visit.     Musculoskeletal: Strength & Muscle Tone: within normal limits and Telehealth visit Gait & Station: normal, Telehealth Patient leans: N/A  Psychiatric Specialty Exam: Review of Systems  Last menstrual period 02/03/2012.There is no height or weight on file to calculate BMI.  General Appearance: Well Groomed  Eye Contact:  Good  Speech:  Clear and Coherent and Normal Rate  Volume:  Normal  Mood:  Euthymic  Affect:  Appropriate and Congruent  Thought Process:  Coherent, Goal Directed, and Linear  Orientation:  Full (Time, Place, and Person)  Thought Content: WDL and Logical   Suicidal Thoughts:  No  Homicidal Thoughts:  No  Memory:  Immediate;   Good Recent;   Good Remote;   Good  Judgement:  Good  Insight:  Good  Psychomotor Activity:  Normal  Concentration:  Concentration: Good and Attention Span: Good  Recall:  Good  Fund of Knowledge: Good  Language: Good  Akathisia:  No  Handed:  Right  AIMS (if indicated): not done  Assets:  Communication Skills Desire for Improvement Financial Resources/Insurance Housing Leisure Time Physical Health Social Support  ADL's:  Intact  Cognition: WNL  Sleep:  Good   Screenings: GAD-7    Flowsheet Row Video Visit from 04/29/2023 in [facility] Counselor from 03/05/2023 in [facility] Counselor from 02/16/2023 in [facility] Video Visit from 02/04/2023 in [facility] Counselor from 01/02/2023 in [facility]  Total GAD-7 Score 4 4 2 6 11       PHQ2-9     Flowsheet Row Video Visit from 04/29/2023 in [facility] Counselor from 03/05/2023 in [facility] Counselor from 02/16/2023 in [facility] Video Visit from 02/04/2023 in [facility] Counselor from 01/02/2023 in [facility]  PHQ-2 Total Score 0 0 1 2 2   PHQ-9 Total Score 0 2 4 6 7       Flowsheet Row Admission (Discharged) from 01/13/2023 in [facility] Pre-Admission Testing 60 from 01/08/2023 in [facility]-PRE-[facility]URGICAL TEST[facility] ED to Hosp-Admission (Discharged) from 11/16/2022 in [facility]  C-SSRS RISK CATEGORY No Risk No Risk No Risk        Assessment and Plan: Patient reports that she is doing well on her current regimen.  No medication changes made today.  Patient agreeable to continue medication as prescribed.     1. GAD (generalized anxiety disorder)  Continue- busPIRone (BUSPAR) 15 MG tablet; Take 1 tablet (15 mg total) by mouth 2 (two) times daily.  Dispense: 60 tablet; Refill: 3 Continue- DULoxetine (CYMBALTA) 20 MG capsule; Take 1 capsule (20 mg total) by mouth daily.  Dispense: 30 capsule; Refill: 3 Continue- traZODone (DESYREL) 50 MG tablet; Take 1-2 tablets (50-100 mg total) by mouth at bedtime.  Dispense: 30 tablet; Refill: 3  2. PTSD (post-traumatic stress disorder)  Continue- busPIRone (BUSPAR) 15 MG tablet; Take 1 tablet (15 mg total) by mouth 2 (two) times daily.  Dispense: 60 tablet; Refill: 3 Continue- traZOdone (DESYREL) 50 MG tablet; Take 1-2 tablets (50-100 mg total) by mouth at bedtime.  Dispense: 30 tablet; Refill: 3  3. Mild depression  Continue- busPIRone (BUSPAR) 15 MG tablet; Take 1 tablet (15 mg total) by mouth 2 (two) times daily.  Dispense: 60 tablet; Refill: 3 - traZODone (DESYREL) 50 MG tablet; Take 1-2 tablets (50-100 mg total) by mouth at bedtime.  Dispense:  30 tablet; Refill: 3 Continue- DULoxetine (CYMBALTA) 20 MG capsule; Take 1 capsule (20 mg total) by mouth daily.  Dispense: 30 capsule; Refill: 3    Collaboration of Care: Collaboration of Care: Other provider involved in patient's care AEB PCP and counselor  Patient/Guardian was advised Release of Information must be obtained prior to any record  release in order to collaborate their care with an outside provider. Patient/Guardian was advised if they have not already done so to contact the registration department to sign all necessary forms in order for [place] to release information regarding their care.   Consent: Patient/Guardian gives verbal consent for treatment and assignment of benefits for services provided during this visit. Patient/Guardian expressed understanding and agreed to proceed.   Follow-up in 2.5 months Follow-up therapy Shanna Cisco, NP 04/29/2023, 10:52 AM

## 2023-05-04 ENCOUNTER — Ambulatory Visit: Payer: Medicaid Other | Admitting: Licensed Clinical Social Worker

## 2023-05-04 ENCOUNTER — Other Ambulatory Visit: Payer: Medicaid Other | Admitting: Licensed Clinical Social Worker

## 2023-05-04 NOTE — Patient Outreach (Incomplete)
Medicaid Managed Care Social Work Note  05/04/2023 Name:  [name] MRN:  [id number] DOB:  1961-11-25  [name] is an 62 y.o. year old female who is a primary patient of Evlyn Kanner, MD.  The [facility] Managed Care Coordination team was consulted for assistance with:  {MM SW CONSULT REASONS:25043}  Ms. Kovalchik was given information about Medicaid Managed Care Coordination team services today. [name] {MMAMBCONSENTCHOICES:26158}  Engaged with patient  for {MMTELEPHONEFACETOFACE:25045} for{MMINITIALFOLLOWUPCHOICE:25046} in response to referral for case management and/or care coordination services.   Assessments/Interventions:  Review of past medical history, allergies, medications, health status, including review of consultants reports, laboratory and other test data, was performed as part of comprehensive evaluation and provision of chronic care management services.  SDOH: (Social Determinant of Health) assessments and interventions performed: SDOH Interventions    Flowsheet Row Patient Outreach Telephone from 04/01/2023 in [place] POPULATION HEALTH DEPARTMENT Patient Outreach Telephone from 03/12/2023 in [place] POPULATION HEALTH DEPARTMENT Patient Outreach Telephone from 03/09/2023 in [place] POPULATION HEALTH DEPARTMENT Counselor from 02/16/2023 in [facility] Patient Outreach Telephone from 02/09/2023 in [place] POPULATION HEALTH DEPARTMENT Patient Outreach Telephone from 02/06/2023 in [place] POPULATION HEALTH DEPARTMENT  SDOH Interventions        Food Insecurity Interventions -- -- -- -- -- Intervention Not Indicated  Housing Interventions -- -- -- -- -- Intervention Not Indicated  Transportation Interventions -- -- Intervention Not Indicated -- -- --  Utilities Interventions -- -- Intervention Not Indicated -- -- --  Alcohol Usage Interventions Intervention Not Indicated (Score <7) -- -- -- -- --  Depression  Interventions/Treatment  -- -- -- Currently on Treatment -- --  Stress Interventions -- [employer], Provide Counseling -- -- [employer], Provide Counseling --       Advanced Directives Status:  {Advanced Directives Status:25048}  Care Plan                 No Known Allergies  Medications Reviewed Today     Reviewed by Shanna Cisco, NP (Nurse Practitioner) on 04/29/23 at 1054  Med List Status: <None>   Medication Order Taking? Sig Documenting Provider Last Dose Status Informant  Accu-Chek FastClix Lancets MISC [id number] No Check blood sugar two times a day Arnetha Courser, MD Taking Active Self  acetaminophen (TYLENOL) 500 MG tablet [id number] No Take 1,000 mg by mouth every 6 (six) hours as needed for moderate pain. [provider] Taking Active Self  albuterol (PROVENTIL) (2.5 MG/3ML) 0.083% nebulizer solution [id number] No USE 1 VIAL IN NEBULIZER EVERY 4 HOURS AS NEEDED FOR WHEEZING OR SHORTNESS OF BREATH Amponsah, Flossie Buffy, MD Taking Active Self  amLODipine (NORVASC) 5 MG tablet [id number]  Take 1 tablet (5 mg total) by mouth daily. Evlyn Kanner, MD  Active   ASPIRIN ADULT LOW STRENGTH 81 MG EC tablet [id number] No TAKE 1 Tablet BY MOUTH ONCE DAILY  Patient not taking: Reported on 03/19/2023   Arnetha Courser, MD Not Taking Active Self  atorvastatin (LIPITOR) 80 MG tablet [id number]  Take 1 tablet (80 mg total) by mouth daily. Evlyn Kanner, MD  Active   B-D UF III MINI PEN NEEDLES 31G X 5 MM MISC [id number] No USE AS DIRECTED DAILY Miguel Aschoff, MD Taking Active Self  Blood Glucose Monitoring Suppl (ACCU-CHEK GUIDE) w/Device KIT [id number] No 1 each by Does not apply route 2 (two) times daily. Arnetha Courser, MD Taking Active Self  busPIRone (BUSPAR)  15 MG tablet [id number]  Take 1 tablet (15 mg total) by mouth 2 (two) times daily. Shanna Cisco, NP  Active   cholecalciferol (VITAMIN D3) 25 MCG (1000 UNIT)  tablet [id number] No Take 1,000 Units by mouth daily. [provider] Taking Active Self  clopidogrel (PLAVIX) 75 MG tablet [id number]  TAKE 1 TABLET BY MOUTH IN THE Wylie Hail, MD  Active   Continuous Blood Gluc Sensor (FREESTYLE LIBRE 3 SENSOR) [place] [id number] No Place 1 sensor on the skin every 14 days. Use to check glucose continuously Evlyn Kanner, MD Taking Active Self  DULoxetine (CYMBALTA) 20 MG capsule [id number]  Take 1 capsule (20 mg total) by mouth daily. Shanna Cisco, NP  Active   Evolocumab (REPATHA SURECLICK) 140 MG/ML Ivory Broad [id number] No INJECT 1 PEN INTO THE SKIN EVERY 14 DAYS Kathleene Hazel, MD Taking Active   exemestane (AROMASIN) 25 MG tablet [id number] No TAKE 1 TABLET BY MOUTH ONCE DAILY AFTER Philippa Sicks, MD Taking Active Self  ezetimibe (ZETIA) 10 MG tablet [id number]  Take 1 tablet by mouth once daily Swinyer, Zachary George, NP  Active   ferrous sulfate 325 (65 FE) MG tablet [id number] No Take 1 tablet (325 mg total) by mouth every other day.  Patient not taking: Reported on 03/19/2023   Merrilyn Puma, MD Not Taking Active Self  Discontinued 05/09/20 1429   Fluticasone-Umeclidin-Vilant (TRELEGY ELLIPTA) 100-62.5-25 MCG/ACT AEPB [id number] No INHALE 1 PUFF ONCE DAILY Strength: 100-62.5-25 MCG/ACT Steffanie Rainwater, MD Taking Active Self  furosemide (LASIX) 40 MG tablet [id number] No Take 1 tablet (40 mg total) by mouth as needed for fluid or edema.  Patient not taking: Reported on 03/19/2023   Chauncey Mann, DO Not Taking Active Self  gabapentin (NEURONTIN) 300 MG capsule [id number]  TAKE 2 CAPSULES BY MOUTH IN THE MORNING AND 3 CAPSULES AT BEDTIME Adron Bene, MD  Active   glucose blood (ACCU-CHEK GUIDE) test strip [id number] No Check blood sugar 2 times per day Evlyn Kanner, MD Taking Active Self  hydrocortisone cream 1 % [id number] No Apply 1 Application topically daily as needed for itching. [provider]  Taking Active Self  LANTUS SOLOSTAR 100 UNIT/ML Solostar Pen [id number] No Inject 30 Units into the skin at bedtime. [provider] Taking Active Self  losartan-hydrochlorothiazide (HYZAAR) 100-25 MG tablet [id number]  Take 1 tablet by mouth once daily Adron Bene, MD  Active   metFORMIN (GLUCOPHAGE-XR) 500 MG 24 hr tablet [id number] No Take 1 tablet (500 mg total) by mouth 2 (two) times daily. Evlyn Kanner, MD Taking Active Self  metoprolol succinate (TOPROL-XL) 50 MG 24 hr tablet [id number] No TAKE 1 TABLET BY MOUTH ONCE DAILY WITH MEALS OR  IMMEDIATELY  FOLLOWING  A  MEAL Evlyn Kanner, MD Taking Active Self  montelukast (SINGULAIR) 10 MG tablet [id number] No Take 1 tablet (10 mg total) by mouth at bedtime. Evlyn Kanner, MD Taking Active Self  nitrofurantoin, macrocrystal-monohydrate, (MACROBID) 100 MG capsule [id number] No Take 1 capsule (100 mg total) by mouth daily. Stoneking, Danford Bad., MD Taking Active Self  ondansetron (ZOFRAN-ODT) 4 MG disintegrating tablet [id number] No Take 1 tablet (4 mg total) by mouth every 8 (eight) hours as needed for nausea or vomiting.  Patient not taking: Reported on 03/19/2023   Waldon Merl, PA-C Not Taking Active   phenazopyridine (PYRIDIUM) 200 MG tablet [id number] No Take 1 tablet (200 mg total) by mouth 3 (three) times daily as needed for pain.  Patient not  taking: Reported on 03/19/2023   Milderd Meager., MD Not Taking Active   Semaglutide,0.25 or 0.5MG /DOS, (OZEMPIC, 0.25 OR 0.5 MG/DOSE,) 2 MG/3ML SOPN [id number]  Inject 0.25 mg weekly for 4 weeks, then increase to 0.5 mg weekly Steffanie Rainwater, MD  Active   senna-docusate (SENNA-TIME S) 8.6-50 MG tablet [id number] No Take 1 tablet by mouth at bedtime as needed for mild constipation. Merrilyn Puma, MD Taking Active Self  traZODone (DESYREL) 50 MG tablet [id number]  Take 1-2 tablets (50-100 mg total) by mouth at bedtime. Shanna Cisco, NP  Active   VENTOLIN HFA 108 (90  Base) MCG/ACT inhaler [id number] No INHALE 2 PUFFS BY MOUTH EVERY 6 HOURS AS NEEDED FOR WHEEZING FOR SHORTNESS OF Alben Spittle, MD Taking Active Self            Patient Active Problem List   Diagnosis Date Noted   Abdominal hernia 03/19/2023   History of UTI 12/05/2022   Sepsis due to Escherichia coli with encephalopathy and septic shock (HCC) 11/17/2022   COPD exacerbation (HCC) 11/16/2022   Diabetic neuropathy (HCC) 08/29/2022   Vaginal lesion 08/29/2022   PTSD (post-traumatic stress disorder) 06/24/2022   GAD (generalized anxiety disorder) 06/24/2022   Major depressive disorder, recurrent episode, moderate (HCC) 06/24/2022   Urinary incontinence 06/20/2022   Intertrigo 06/20/2022   Arthritis of left shoulder region    S/P reverse total shoulder arthroplasty, 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   Healthcare maintenance 11/17/2021   Spondylolisthesis, lumbar region 02/11/2021    Class: Acute   Malignant neoplasm of upper-outer quadrant of right breast in female, estrogen receptor positive (HCC) 06/10/2018   Restless leg syndrome 02/04/2018   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   Depression 08/06/2015   Hypertension associated with diabetes (HCC)    Aortic stenosis    Chronic diastolic congestive heart failure (HCC)    COPD (chronic obstructive pulmonary disease) (HCC) 09/21/2013   Obstructive sleep apnea 09/21/2013   Nephrolithiasis 09/21/2013   Nocturnal hypoxemia 09/12/2013   Hyperlipidemia 11/30/2012   History of pulmonary embolus (PE) 07/09/2012   Normocytic anemia 02/08/2012   Type 2 diabetes mellitus without complication, without long-term current use of insulin (HCC) 02/08/2012   Asthma 02/08/2012   GERD (gastroesophageal reflux disease)    CAD (coronary artery disease) 12/08/2011    Conditions to be addressed/monitored per PCP order:  {CCM ASSESSMENT DZ OPTIONS:25047}  There are no care plans that you recently modified to display for this  patient.   Follow up:  {FOLLOWUP:24991}  Plan: {MANAGED MEDICAID FOLLOW UP GEXB:28413}  Date/time of next scheduled Social Work care management/care coordination outreach:  ***  ***

## 2023-05-05 NOTE — Patient Instructions (Signed)
Visit Information  [name] was given information about Medicaid Managed Care team care coordination services as a part of their [facility] Medicaid benefit. [name] verbally consented to engagement with the [facility] Managed Care team.   If you are experiencing a medical emergency, please call 911 or report to your local emergency department or urgent care.   If you have a non-emergency medical problem during routine business hours, please contact your provider's office and ask to speak with a nurse.   For questions related to your [facility] health plan, please call: [phone] or go here:[email or web address]  If you would like to schedule transportation through your [facility] plan, please call the following number at least 2 days in advance of your appointment: [phone].   You can also use the MTM portal or MTM mobile app to manage your rides. Reimbursement for transportation is available through [facility]! For the portal, please go to [email or web address]  Call the [facility] Crisis Line at [phone], at any time, 24 hours a day, 7 days a week. If you are in danger or need immediate medical attention call 911.  If you would like help to quit smoking, call 1-800-QUIT-NOW (614-093-9911) OR Espaol: 1-855-Djelo-Ya (2-725-366-4403) o para ms informacin haga clic aqu or Text READY to 474-259 to register via text  Dickie La, BSW, MSW, [employer] Managed Medicaid LCSW [facility]  [facility] [email or web address] Phone: [phone]

## 2023-05-07 ENCOUNTER — Other Ambulatory Visit: Payer: Self-pay | Admitting: Student

## 2023-05-07 DIAGNOSIS — J441 Chronic obstructive pulmonary disease with (acute) exacerbation: Secondary | ICD-10-CM

## 2023-05-07 NOTE — Telephone Encounter (Signed)
Patient called in requesting refill on Trellegy at [facility].

## 2023-05-11 ENCOUNTER — Other Ambulatory Visit: Payer: Self-pay | Admitting: Student

## 2023-05-11 DIAGNOSIS — E1142 Type 2 diabetes mellitus with diabetic polyneuropathy: Secondary | ICD-10-CM

## 2023-05-12 ENCOUNTER — Other Ambulatory Visit: Payer: Medicaid Other | Admitting: Obstetrics and Gynecology

## 2023-05-12 ENCOUNTER — Encounter: Payer: Self-pay | Admitting: Obstetrics and Gynecology

## 2023-05-12 NOTE — Patient Instructions (Signed)
Hi [name], nice to catch up today-I hope you have a nice day-please call your PCP for the sciatica pain and discuss with them.    [name] was given information about Medicaid Managed Care team care coordination services as a part of their [facility] Medicaid benefit. [name] verbally consented to engagement with the [facility] Managed Care team.   If you are experiencing a medical emergency, please call 911 or report to your local emergency department or urgent care.   If you have a non-emergency medical problem during routine business hours, please contact your provider's office and ask to speak with a nurse.   For questions related to your [facility] health plan, please call: [phone] or go here:[email or web address]  If you would like to schedule transportation through your [facility] plan, please call the following number at least 2 days in advance of your appointment: [phone].   You can also use the MTM portal or MTM mobile app to manage your rides. Reimbursement for transportation is available through [facility]! For the portal, please go to [email or web address]  Call the [facility] Crisis Line at [phone], at any time, 24 hours a day, 7 days a week. If you are in danger or need immediate medical attention call 911.  If you would like help to quit smoking, call 1-800-QUIT-NOW (813-646-2358) OR Espaol: 1-855-Djelo-Ya (4-132-440-1027) o para ms informacin haga clic aqu or Text READY to 253-664 to register via text  [name] - following are the goals we discussed in your visit today:   Goals Addressed    Timeframe:  Long-Range Goal Priority:  High Start Date:      04/14/22                       Expected End Date:     ongoing                  Follow Up Date 06/16/23   - schedule appointment for flu shot - schedule appointment for vaccines needed due to my age or health - schedule recommended health tests (blood work,  mammogram, colonoscopy, pap test) - schedule and keep appointment for annual check-up    Why is this important?   Screening tests can find diseases early when they are easier to treat.  Your doctor or nurse will talk with you about which tests are important for you.  Getting shots for common diseases like the flu and shingles will help prevent them.  05/12/23:  Patient up to date on appts  Patient verbalizes understanding of instructions and care plan provided today and agrees to view in MyChart. Active MyChart status and patient understanding of how to access instructions and care plan via MyChart confirmed with patient.     The Managed Medicaid care management team will [name] out to the patient again over the next 30 business  days.  The  Patient  has been provided with contact information for the Managed Medicaid care management team and has been advised to call with any health related questions or concerns.   [name] RN, BSN [place]  [facility] Care Management Coordinator - Managed Medicaid High Risk [phone]   Following is a copy of your plan of care:  Care Plan : RN Care Manager Plan of Care  Updates made by [name], RN since 05/12/2023 12:00 AM     Problem: Health Promotion or Disease Self-Management (General Plan of Care)   Priority: High  Onset Date: 04/14/2022     Long-Range Goal: Chronic Disease Management and Care Coordination Needs   Start Date: 04/14/2022  Expected End Date: 07/01/2023  Recent Progress: Not on track  Priority: High  Note:   Current Barriers:  Knowledge Deficits related to plan of care for management of Anxiety and Depression along with other chronic health conditions, HTN, DM, HF, asthma, COPD, OSA, arthritis, GERD, CAD, HLD, h/o breast cancer, RLS Chronic Disease Management support and education needs related to Anxiety and Depression. DM, HTN, HF, asthma, OSA, arthritis, GERD, CAD, HLD, h/o breast cancer, RLS. 05/12/23:   patient with sciatica pain right side-tares as a 7-8, 4-5 after pain med. Bloos sugars 70-180, does not check BP.  Continues to speak with [name] and finds this helpful.  Breathing WNL  RNCM Clinical Goal(s):  Patient will verbalize understanding of plan for management of Anxiety and Depression, DM, HTN  as evidenced by patient report take all medications exactly as prescribed and will call provider for medication related questions as evidenced by patient report demonstrate understanding of rationale for each prescribed medication as evidenced by patient report attend all scheduled medical appointments  as evidenced by patient report continue to work with RN Care Manager to address care management and care coordination needs related to  Anxiety and Depression, DM, HTN as evidenced by adherence to CM Team Scheduled appointments work with Child psychotherapist to address  related to the management of Mental Health Concerns  related to the management of Anxiety and Depression as evidenced by review of EMR and patient or Child psychotherapist report through collaboration with Medical illustrator, provider, and care team.   Interventions: Inter-disciplinary care team collaboration (see longitudinal plan of care) Evaluation of current treatment plan related to  self management and patient's adherence to plan as established by provider   (Status:  New goal.)  Long Term Goal Evaluation of current treatment plan related to Anxiety and Depression, Mental Health Concerns  self-management and patient's adherence to plan as established by provider. Discussed plans with patient for ongoing care management follow up and provided patient with direct contact information for care management team Advised patient to contact provider for any additional DME needs, CBG, and possible general surgeon referral. Reviewed medications with patient Collaborated with LCSW regarding anxiety/depression Reviewed scheduled/upcoming provider  appointments Advised patient, providing education and rationale, to check cbg and record, calling provider  for findings outside established parameters  Social Work referral for anxiety/depression Discussed plans with patient for ongoing care management follow up and provided patient with direct contact information for care management team Assessed social determinant of health barriers Collaborated with Lubertha Sayres Managed Medicaid Liaison regarding DME-motorized wheelchair and lift chair. LCSW appointment rescheduled. Collaborated with Pharmacist. Pharmacy referral for polypharmacy at patient's request-completed  Asthma: (Status:New goal.) Long Term Goal Advised patient to track and manage Asthma triggers Advised patient to engage in light exercise as tolerated 3-5 days a week to aid in the the management of Asthma Provided education about and advised patient to utilize infection prevention strategies to reduce risk of respiratory infection Discussed the importance of adequate rest and management of fatigue with Asthma Assessed social determinant of health barriers   CAD Interventions: (Status:  New goal.) Long Term Goal Assessed understanding of CAD diagnosis Medications reviewed including medications utilized in CAD treatment plan Provided education on importance of blood pressure control in management of CAD Provided education on Importance of limiting foods high in cholesterol Counseled on importance of regular  laboratory monitoring as prescribed Counseled on the importance of exercise goals with target of 150 minutes per week Reviewed Importance of taking all medications as prescribed Reviewed Importance of attending all scheduled provider appointments Advised to report any changes in symptoms or exercise tolerance Assessed social determinant of health barriers  Heart Failure Interventions:  (Status:  New goal.) Long Term Goal Basic overview and discussion of  pathophysiology of Heart Failure reviewed Provided education on low sodium diet Assessed need for readable accurate scales in home Provided education about placing scale on hard, flat surface Advised patient to weigh each morning after emptying bladder Discussed importance of daily weight and advised patient to weigh and record daily Discussed the importance of keeping all appointments with provider Provided patient with education about the role of exercise in the management of heart failure Assessed social determinant of health barriers   COPD Interventions:  (Status:  New goal.) Long Term Goal Advised patient to track and manage COPD triggers Advised patient to self assesses COPD action plan zone and make appointment with provider if in the yellow zone for 48 hours without improvement Advised patient to engage in light exercise as tolerated 3-5 days a week to aid in the the management of COPD Provided education about and advised patient to utilize infection prevention strategies to reduce risk of respiratory infection Discussed the importance of adequate rest and management of fatigue with COPD Assessed social determinant of health barriers  Hyperlipidemia Interventions:  (Status:  New goal.) Long Term Goal Medication review performed; medication list updated in electronic medical record.  Provider established cholesterol goals reviewed Counseled on importance of regular laboratory monitoring as prescribed Reviewed importance of limiting foods high in cholesterol Reviewed exercise goals and target of 150 minutes per week Assessed social determinant of health barriers    Diabetes Interventions:  (Status:  New goal.) Long Term Goal Assessed patient's understanding of A1c goal: <7% Reviewed medications with patient and discussed importance of medication adherence Reviewed scheduled/upcoming provider appointments  Advised patient, providing education and rationale, to check cbg and  record, calling provider for findings outside established parameters Review of patient status, including review of consultants reports, relevant laboratory and other test results, and medications completed Assessed social determinant of health barriers Lab Results  Component Value Date   HGBA1C 7.1 (A) 08/27/2022  HGB A1C  6.6 on 01/08/23  Hypertension Interventions:  (Status:  New goal.) Long Term Goal Last practice recorded BP readings:  BP Readings from Last 3 Encounters:  09/12/22 133/69  08/27/22 (!) 109/58  07/01/22 137/75  12/25/22          124/60 03/18/23         130/50  Most recent eGFR/CrCl:  Lab Results  Component Value Date   EGFR 90 06/04/2022    No components found for: "CRCL"  Evaluation of current treatment plan related to hypertension self management and patient's adherence to plan as established by provider Reviewed medications with patient and discussed importance of compliance Discussed plans with patient for ongoing care management follow up and provided patient with direct contact information for care management team Advised patient, providing education and rationale, to monitor blood pressure daily and record, calling PCP for findings outside established parameters Reviewed scheduled/upcoming provider appointments including:  Assessed social determinant of health barriers   Patient Goals/Self-Care Activities: Take all medications as prescribed Attend all scheduled provider appointments Call pharmacy for medication refills 3-7 days in advance of running out of medications Perform all self care activities  independently  Perform IADL's (shopping, preparing meals, housekeeping, managing finances) independently Call provider office for new concerns or questions  Work with the social worker to address care coordination needs and will continue to work with the clinical team to address health care and disease management related needs Patient to f/u with provider  regarding sciatica pain  Follow Up Plan:  The patient has been provided with contact information for the care management team and has been advised to call with any health related questions or concerns.  The care management team will [name] out to the patient again over the next 60 business  days.

## 2023-05-12 NOTE — Patient Outreach (Signed)
Medicaid Managed Care   Nurse Care Manager Note  05/12/2023 Name:  [name] MRN:  [id number] DOB:  06-May-1961  [name] is an 62 y.o. year old female who is a primary patient of Evlyn Kanner, MD.  [facility] Managed Care Coordination team was consulted for assistance with:    Chronic healthcare management needs, OSA, GERD, CAD, arthritis, HLD, RLS, HTN, DM, HF, asthma, COPD, anxiety/depression/PTSD, h/o breast cancer, sciatica  [name] was given information about Medicaid Managed Care Coordination team services today. [name] Patient agreed to services and verbal consent obtained.  Engaged with patient by telephone for follow up visit in response to provider referral for case management and/or care coordination services.   Assessments/Interventions:  Review of past medical history, allergies, medications, health status, including review of consultants reports, laboratory and other test data, was performed as part of comprehensive evaluation and provision of chronic care management services.  SDOH (Social Determinants of Health) assessments and interventions performed: SDOH Interventions    Flowsheet Row Patient Outreach Telephone from 05/12/2023 in [place] POPULATION HEALTH DEPARTMENT Patient Outreach Telephone from 05/04/2023 in [place] POPULATION HEALTH DEPARTMENT Patient Outreach Telephone from 04/01/2023 in [place] POPULATION HEALTH DEPARTMENT Patient Outreach Telephone from 03/12/2023 in [place] POPULATION HEALTH DEPARTMENT Patient Outreach Telephone from 03/09/2023 in [place] POPULATION HEALTH DEPARTMENT Counselor from 02/16/2023 in [facility]  SDOH Interventions        Transportation Interventions -- -- -- -- Intervention Not Indicated --  Utilities Interventions -- -- -- -- Intervention Not Indicated --  Alcohol Usage Interventions -- -- Intervention Not Indicated (Score <7) -- -- --  Depression  Interventions/Treatment  -- -- -- -- -- Currently on Treatment  Financial Strain Interventions Intervention Not Indicated -- -- -- -- --  Stress Interventions -- [employer], Provide Counseling -- [employer], Provide Counseling -- --     Care Plan  No Known Allergies  Medications Reviewed Today     Reviewed by [name], RN (Registered Nurse) on 05/12/23 at 1055  Med List Status: <None>   Medication Order Taking? Sig Documenting Provider Last Dose Status Informant  Accu-Chek FastClix Lancets MISC [id number] No Check blood sugar two times a day Arnetha Courser, MD Taking Active Self  acetaminophen (TYLENOL) 500 MG tablet [id number] No Take 1,000 mg by mouth every 6 (six) hours as needed for moderate pain. [provider] Taking Active Self  albuterol (PROVENTIL) (2.5 MG/3ML) 0.083% nebulizer solution [id number] No USE 1 VIAL IN NEBULIZER EVERY 4 HOURS AS NEEDED FOR WHEEZING OR SHORTNESS OF BREATH Amponsah, Flossie Buffy, MD Taking Active Self  amLODipine (NORVASC) 5 MG tablet [id number]  Take 1 tablet (5 mg total) by mouth daily. Evlyn Kanner, MD  Active   ASPIRIN ADULT LOW STRENGTH 81 MG EC tablet [id number] No TAKE 1 Tablet BY MOUTH ONCE DAILY  Patient not taking: Reported on 03/19/2023   Arnetha Courser, MD Not Taking Active Self  atorvastatin (LIPITOR) 80 MG tablet [id number]  Take 1 tablet (80 mg total) by mouth daily. Evlyn Kanner, MD  Active   B-D UF III MINI PEN NEEDLES 31G X 5 MM MISC [id number] No USE AS DIRECTED DAILY [name] Aschoff, MD Taking Active Self  Blood Glucose Monitoring Suppl (ACCU-CHEK GUIDE) w/Device KIT [id number] No 1 each by Does not apply route 2 (two) times daily. Arnetha Courser, MD Taking Active Self  busPIRone (BUSPAR) 15 MG tablet [id number]  Take 1 tablet (15 mg total) by mouth 2 (two) times daily. Shanna Cisco, NP  Active   cholecalciferol (VITAMIN D3) 25 MCG (1000 UNIT) tablet [id number] No  Take 1,000 Units by mouth daily. [provider] Taking Active Self  clopidogrel (PLAVIX) 75 MG tablet [id number]  TAKE 1 TABLET BY MOUTH IN THE Wylie Hail, MD  Active   Continuous Blood Gluc Sensor (FREESTYLE LIBRE 3 SENSOR) [place] [id number] No Place 1 sensor on the skin every 14 days. Use to check glucose continuously Evlyn Kanner, MD Taking Active Self  DULoxetine (CYMBALTA) 20 MG capsule [id number]  Take 1 capsule (20 mg total) by mouth daily. Shanna Cisco, NP  Active   Evolocumab (REPATHA SURECLICK) 140 MG/ML Ivory Broad [id number] No INJECT 1 PEN INTO THE SKIN EVERY 14 DAYS Kathleene Hazel, MD Taking Active   exemestane (AROMASIN) 25 MG tablet [id number] No TAKE 1 TABLET BY MOUTH ONCE DAILY AFTER Philippa Sicks, MD Taking Active Self  ezetimibe (ZETIA) 10 MG tablet [id number]  Take 1 tablet by mouth once daily [name], [name] George, NP  Active   ferrous sulfate 325 (65 FE) MG tablet [id number] No Take 1 tablet (325 mg total) by mouth every other day.  Patient not taking: Reported on 03/19/2023   [name] Puma, MD Not Taking Active Self  Discontinued 05/09/20 1429   Fluticasone-Umeclidin-Vilant (TRELEGY ELLIPTA) 100-62.5-25 MCG/ACT AEPB [id number]  INHALE 1 PUFF ONCE DAILY Evlyn Kanner, MD  Active   furosemide (LASIX) 40 MG tablet [id number] No Take 1 tablet (40 mg total) by mouth as needed for fluid or edema.  Patient not taking: Reported on 03/19/2023   [name] Mann, DO Not Taking Active Self  gabapentin (NEURONTIN) 300 MG capsule [id number]  TAKE 2 CAPSULES BY MOUTH IN THE MORNING AND 3 CAPSULES AT BEDTIME Evlyn Kanner, MD  Active   glucose blood (ACCU-CHEK GUIDE) test strip [id number] No Check blood sugar 2 times per day Evlyn Kanner, MD Taking Active Self  hydrocortisone cream 1 % [id number] No Apply 1 Application topically daily as needed for itching. [provider] Taking Active Self  LANTUS SOLOSTAR 100 UNIT/ML Solostar Pen  [id number] No Inject 30 Units into the skin at bedtime. [provider] Taking Active Self  losartan-hydrochlorothiazide (HYZAAR) 100-25 MG tablet [id number]  Take 1 tablet by mouth once daily Adron Bene, MD  Active   metFORMIN (GLUCOPHAGE-XR) 500 MG 24 hr tablet [id number]  Take 1 tablet by mouth twice daily Evlyn Kanner, MD  Active   metoprolol succinate (TOPROL-XL) 50 MG 24 hr tablet [id number] No TAKE 1 TABLET BY MOUTH ONCE DAILY WITH MEALS OR  IMMEDIATELY  FOLLOWING  A  MEAL Evlyn Kanner, MD Taking Active Self  montelukast (SINGULAIR) 10 MG tablet [id number] No Take 1 tablet (10 mg total) by mouth at bedtime. Evlyn Kanner, MD Taking Expired 05/03/23 2359 Self  nitrofurantoin, macrocrystal-monohydrate, (MACROBID) 100 MG capsule [id number] No Take 1 capsule (100 mg total) by mouth daily. Stoneking, Danford Bad., MD Taking Active Self  ondansetron (ZOFRAN-ODT) 4 MG disintegrating tablet [id number] No Take 1 tablet (4 mg total) by mouth every 8 (eight) hours as needed for nausea or vomiting.  Patient not taking: Reported on 03/19/2023   Waldon Merl, PA-C Not Taking Active   phenazopyridine (PYRIDIUM) 200 MG tablet [id number] No Take 1 tablet (200 mg total) by mouth 3 (three) times daily as needed for pain.  Patient not taking: Reported on 03/19/2023   Milderd Meager., MD Not Taking  Active   Semaglutide,0.25 or 0.5MG /DOS, (OZEMPIC, 0.25 OR 0.5 MG/DOSE,) 2 MG/3ML SOPN [id number]  Inject 0.25 mg weekly for 4 weeks, then increase to 0.5 mg weekly Steffanie Rainwater, MD  Active   senna-docusate (SENNA-TIME S) 8.6-50 MG tablet [id number] No Take 1 tablet by mouth at bedtime as needed for mild constipation. [name] Puma, MD Taking Active Self  traZODone (DESYREL) 50 MG tablet [id number]  Take 1-2 tablets (50-100 mg total) by mouth at bedtime. Shanna Cisco, NP  Active   VENTOLIN HFA 108 (90 Base) MCG/ACT inhaler [id number] No INHALE 2 PUFFS BY MOUTH EVERY 6 HOURS AS NEEDED  FOR WHEEZING FOR SHORTNESS OF Alben Spittle, MD Taking Active Self           Patient Active Problem List   Diagnosis Date Noted   Abdominal hernia 03/19/2023   History of UTI 12/05/2022   Sepsis due to Escherichia coli with encephalopathy and septic shock (HCC) 11/17/2022   COPD exacerbation (HCC) 11/16/2022   Diabetic neuropathy (HCC) 08/29/2022   Vaginal lesion 08/29/2022   PTSD (post-traumatic stress disorder) 06/24/2022   GAD (generalized anxiety disorder) 06/24/2022   Major depressive disorder, recurrent episode, moderate (HCC) 06/24/2022   Urinary incontinence 06/20/2022   Intertrigo 06/20/2022   Arthritis of left shoulder region    S/P reverse total shoulder arthroplasty, 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   Healthcare maintenance 11/17/2021   Spondylolisthesis, lumbar region 02/11/2021    Class: Acute   Malignant neoplasm of upper-outer quadrant of right breast in female, estrogen receptor positive (HCC) 06/10/2018   Restless leg syndrome 02/04/2018   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   Depression 08/06/2015   Hypertension associated with diabetes (HCC)    Aortic stenosis    Chronic diastolic congestive heart failure (HCC)    COPD (chronic obstructive pulmonary disease) (HCC) 09/21/2013   Obstructive sleep apnea 09/21/2013   Nephrolithiasis 09/21/2013   Nocturnal hypoxemia 09/12/2013   Hyperlipidemia 11/30/2012   History of pulmonary embolus (PE) 07/09/2012   Normocytic anemia 02/08/2012   Type 2 diabetes mellitus without complication, without long-term current use of insulin (HCC) 02/08/2012   Asthma 02/08/2012   GERD (gastroesophageal reflux disease)    CAD (coronary artery disease) 12/08/2011   Conditions to be addressed/monitored per PCP order:  Chronic healthcare management needs, OSA, GERD, CAD, arthritis, HLD, RLS, HTN, DM, HF, asthma, COPD, anxiety/depression/PTSD, h/o breast cancer, sciatica  Care Plan : RN Care Manager Plan of Care  Updates  made by [name], RN since 05/12/2023 12:00 AM     Problem: Health Promotion or Disease Self-Management (General Plan of Care)   Priority: High  Onset Date: 04/14/2022     Long-Range Goal: Chronic Disease Management and Care Coordination Needs   Start Date: 04/14/2022  Expected End Date: 07/01/2023  Recent Progress: Not on track  Priority: High  Note:   Current Barriers:  Knowledge Deficits related to plan of care for management of Anxiety and Depression along with other chronic health conditions, HTN, DM, HF, asthma, COPD, OSA, arthritis, GERD, CAD, HLD, h/o breast cancer, RLS Chronic Disease Management support and education needs related to Anxiety and Depression. DM, HTN, HF, asthma, OSA, arthritis, GERD, CAD, HLD, h/o breast cancer, RLS. 05/12/23:  patient with sciatica pain right side-tares as a 7-8, 4-5 after pain med. Bloos sugars 70-180, does not check BP.  Continues to speak with [name] and finds this helpful.  Breathing WNL  RNCM Clinical Goal(s):  Patient will verbalize understanding of plan for management  of Anxiety and Depression, DM, HTN  as evidenced by patient report take all medications exactly as prescribed and will call provider for medication related questions as evidenced by patient report demonstrate understanding of rationale for each prescribed medication as evidenced by patient report attend all scheduled medical appointments  as evidenced by patient report continue to work with RN Care Manager to address care management and care coordination needs related to  Anxiety and Depression, DM, HTN as evidenced by adherence to CM Team Scheduled appointments work with Child psychotherapist to address  related to the management of Mental Health Concerns  related to the management of Anxiety and Depression as evidenced by review of EMR and patient or Child psychotherapist report through collaboration with Medical illustrator, provider, and care team.    Interventions: Inter-disciplinary care team collaboration (see longitudinal plan of care) Evaluation of current treatment plan related to  self management and patient's adherence to plan as established by provider   (Status:  New goal.)  Long Term Goal Evaluation of current treatment plan related to Anxiety and Depression, Mental Health Concerns  self-management and patient's adherence to plan as established by provider. Discussed plans with patient for ongoing care management follow up and provided patient with direct contact information for care management team Advised patient to contact provider for any additional DME needs, CBG, and possible general surgeon referral. Reviewed medications with patient Collaborated with LCSW regarding anxiety/depression Reviewed scheduled/upcoming provider appointments Advised patient, providing education and rationale, to check cbg and record, calling provider  for findings outside established parameters  Social Work referral for anxiety/depression Discussed plans with patient for ongoing care management follow up and provided patient with direct contact information for care management team Assessed social determinant of health barriers Collaborated with Lubertha Sayres Managed Medicaid Liaison regarding DME-motorized wheelchair and lift chair. LCSW appointment rescheduled. Collaborated with Pharmacist. Pharmacy referral for polypharmacy at patient's request-completed  Asthma: (Status:New goal.) Long Term Goal Advised patient to track and manage Asthma triggers Advised patient to engage in light exercise as tolerated 3-5 days a week to aid in the the management of Asthma Provided education about and advised patient to utilize infection prevention strategies to reduce risk of respiratory infection Discussed the importance of adequate rest and management of fatigue with Asthma Assessed social determinant of health barriers   CAD Interventions:  (Status:  New goal.) Long Term Goal Assessed understanding of CAD diagnosis Medications reviewed including medications utilized in CAD treatment plan Provided education on importance of blood pressure control in management of CAD Provided education on Importance of limiting foods high in cholesterol Counseled on importance of regular laboratory monitoring as prescribed Counseled on the importance of exercise goals with target of 150 minutes per week Reviewed Importance of taking all medications as prescribed Reviewed Importance of attending all scheduled provider appointments Advised to report any changes in symptoms or exercise tolerance Assessed social determinant of health barriers  Heart Failure Interventions:  (Status:  New goal.) Long Term Goal Basic overview and discussion of pathophysiology of Heart Failure reviewed Provided education on low sodium diet Assessed need for readable accurate scales in home Provided education about placing scale on hard, flat surface Advised patient to weigh each morning after emptying bladder Discussed importance of daily weight and advised patient to weigh and record daily Discussed the importance of keeping all appointments with provider Provided patient with education about the role of exercise in the management of heart failure Assessed social determinant of health barriers  COPD Interventions:  (Status:  New goal.) Long Term Goal Advised patient to track and manage COPD triggers Advised patient to self assesses COPD action plan zone and make appointment with provider if in the yellow zone for 48 hours without improvement Advised patient to engage in light exercise as tolerated 3-5 days a week to aid in the the management of COPD Provided education about and advised patient to utilize infection prevention strategies to reduce risk of respiratory infection Discussed the importance of adequate rest and management of fatigue with COPD Assessed  social determinant of health barriers  Hyperlipidemia Interventions:  (Status:  New goal.) Long Term Goal Medication review performed; medication list updated in electronic medical record.  Provider established cholesterol goals reviewed Counseled on importance of regular laboratory monitoring as prescribed Reviewed importance of limiting foods high in cholesterol Reviewed exercise goals and target of 150 minutes per week Assessed social determinant of health barriers    Diabetes Interventions:  (Status:  New goal.) Long Term Goal Assessed patient's understanding of A1c goal: <7% Reviewed medications with patient and discussed importance of medication adherence Reviewed scheduled/upcoming provider appointments  Advised patient, providing education and rationale, to check cbg and record, calling provider for findings outside established parameters Review of patient status, including review of consultants reports, relevant laboratory and other test results, and medications completed Assessed social determinant of health barriers Lab Results  Component Value Date   HGBA1C 7.1 (A) 08/27/2022  HGB A1C  6.6 on 01/08/23  Hypertension Interventions:  (Status:  New goal.) Long Term Goal Last practice recorded BP readings:  BP Readings from Last 3 Encounters:  09/12/22 133/69  08/27/22 (!) 109/58  07/01/22 137/75  12/25/22          124/60 03/18/23         130/50  Most recent eGFR/CrCl:  Lab Results  Component Value Date   EGFR 90 06/04/2022    No components found for: "CRCL"  Evaluation of current treatment plan related to hypertension self management and patient's adherence to plan as established by provider Reviewed medications with patient and discussed importance of compliance Discussed plans with patient for ongoing care management follow up and provided patient with direct contact information for care management team Advised patient, providing education and rationale, to monitor  blood pressure daily and record, calling PCP for findings outside established parameters Reviewed scheduled/upcoming provider appointments including:  Assessed social determinant of health barriers   Patient Goals/Self-Care Activities: Take all medications as prescribed Attend all scheduled provider appointments Call pharmacy for medication refills 3-7 days in advance of running out of medications Perform all self care activities independently  Perform IADL's (shopping, preparing meals, housekeeping, managing finances) independently Call provider office for new concerns or questions  Work with the social worker to address care coordination needs and will continue to work with the clinical team to address health care and disease management related needs Patient to f/u with provider regarding sciatica pain  Follow Up Plan:  The patient has been provided with contact information for the care management team and has been advised to call with any health related questions or concerns.  The care management team will reach out to the patient again over the next 60 business  days.     Long-Range Goal: Establish Plan of Care for Chronic Disease Management Needs   Priority: High  Note:   Timeframe:  Long-Range Goal Priority:  High Start Date:      04/14/22  Expected End Date:     ongoing                  Follow Up Date 06/16/23   - schedule appointment for flu shot - schedule appointment for vaccines needed due to my age or health - schedule recommended health tests (blood work, mammogram, colonoscopy, pap test) - schedule and keep appointment for annual check-up    Why is this important?   Screening tests can find diseases early when they are easier to treat.  Your doctor or nurse will talk with you about which tests are important for you.  Getting shots for common diseases like the flu and shingles will help prevent them.  05/12/23:  Patient up to date on appts    Follow Up:  Patient agrees to Care Plan and Follow-up.  Plan: The Managed Medicaid care management team will reach out to the patient again over the next 30 business  days. and The  Patient has been provided with contact information for the Managed Medicaid care management team and has been advised to call with any health related questions or concerns.  Date/time of next scheduled RN care management/care coordination outreach:  06/16/23 at 1030

## 2023-05-14 ENCOUNTER — Ambulatory Visit (INDEPENDENT_AMBULATORY_CARE_PROVIDER_SITE_OTHER): Payer: Medicaid Other | Admitting: Licensed Clinical Social Worker

## 2023-05-14 DIAGNOSIS — F4310 Post-traumatic stress disorder, unspecified: Secondary | ICD-10-CM

## 2023-05-14 DIAGNOSIS — F331 Major depressive disorder, recurrent, moderate: Secondary | ICD-10-CM

## 2023-05-14 DIAGNOSIS — F411 Generalized anxiety disorder: Secondary | ICD-10-CM
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
## 2023-05-20 ENCOUNTER — Ambulatory Visit (INDEPENDENT_AMBULATORY_CARE_PROVIDER_SITE_OTHER): Payer: Medicaid Other | Admitting: Student

## 2023-05-20 ENCOUNTER — Other Ambulatory Visit: Payer: Self-pay

## 2023-05-20 ENCOUNTER — Encounter: Payer: Self-pay | Admitting: Student

## 2023-05-20 VITALS — BP 124/48 | HR 82 | Temp 98.7°F | Ht 63.0 in | Wt 302.8 lb

## 2023-05-20 DIAGNOSIS — M5431 Sciatica, right side: Secondary | ICD-10-CM | POA: Diagnosis not present

## 2023-05-20 DIAGNOSIS — M5430 Sciatica, unspecified side: Secondary | ICD-10-CM | POA: Insufficient documentation

## 2023-05-20 NOTE — Progress Notes (Signed)
CC: Back pain  HPI:  [name] is a 62 y.o. female with past medical history of CAD, congestive heart failure, hypertension, type 2 diabetes who presents for back pain.  Please see assessment and plan for full HPI.  Past Medical History:  Diagnosis Date   Anemia    Anxiety    Aortic valve stenosis, severe    Arthritis    PAIN AND SEVERE OA LEFT KNEE ; S/P RIGHT TKA ON 02/03/12; HAS LOWER BACK PAIN-UNABLE TO STAND MORE THAN 10 MIN; ARTHRITIS "ALL OVER"   Asthma    Blood transfusion    [facility]   Breast cancer in female [facility])    Right   CAD (coronary artery disease)    Cath 2010 with DES x 1 RCA-- PT'S CARDIOLOGIST IS DR. MCALHANY   Chronic diastolic congestive heart failure (HCC)    COPD (chronic obstructive pulmonary disease) (HCC)    Depression    Diabetes mellitus DIAGNOSED IN2010   Dyspnea    with much ambulation   Eczema    on back   Headache    migraines younger- rare now 02/07/21   Heart murmur    no current problems   History of hiatal hernia    History of kidney stones    passed or blasted   Hyperlipidemia    Hypertension    Morbid obesity with body mass index of 60.0-69.9 in adult [facility])    Myocardial infarction (HCC)    PT THINKS SHE WAS DX WITH MI AT THE TIME OF HEART STENTING   Neuromuscular disorder (HCC)    bilateral arm/hands   Oxygen dependent    uses 3L oxygen night/prn   Personal history of radiation therapy    Pneumonia    Pulmonary embolism (HCC) 02/08/2012   S/P RT TOTAL KNEE ON 02/03/12--ON 02/08/12--DEVELOPED ACUTE SOB AND CHEST PAIN--AND DIAGNOSED WITH  PULMONARY EMBOLUS AND PNEUMONIA   Restless leg syndrome    Sleep apnea    uses 3 liters O2 at night as needed   Uterine fibroid    NO PROBLEMS AT PRESENT FROM THE FIBROIDS-STATES SHE IS POST MENOPAUSAL-LAST MENSES 2010 EXCEPT FOR EPISODE THIS YR OF BLEEDING RELATED TO FIBROIDS.   Weakness    BOTH HANDS - S/P BILATERAL CARPAL TUNNEL RELEASE--BUT STILL HAS WEAKNESS--OFTEN DROPS  THINGS     Current Outpatient Medications:    Accu-Chek FastClix Lancets MISC, Check blood sugar two times a day, Disp: 102 each, Rfl: 9   acetaminophen (TYLENOL) 500 MG tablet, Take 1,000 mg by mouth every 6 (six) hours as needed for moderate pain., Disp: , Rfl:    albuterol (PROVENTIL) (2.5 MG/3ML) 0.083% nebulizer solution, USE 1 VIAL IN NEBULIZER EVERY 4 HOURS AS NEEDED FOR WHEEZING OR SHORTNESS OF BREATH, Disp: 150 mL, Rfl: 1   amLODipine (NORVASC) 5 MG tablet, Take 1 tablet (5 mg total) by mouth daily., Disp: 90 tablet, Rfl: 2   ASPIRIN ADULT LOW STRENGTH 81 MG EC tablet, TAKE 1 Tablet BY MOUTH ONCE DAILY (Patient not taking: Reported on 03/19/2023), Disp: 90 tablet, Rfl: 3   atorvastatin (LIPITOR) 80 MG tablet, Take 1 tablet (80 mg total) by mouth daily., Disp: 90 tablet, Rfl: 2   B-D UF III MINI PEN NEEDLES 31G X 5 MM MISC, USE AS DIRECTED DAILY, Disp: 100 each, Rfl: 5   Blood Glucose Monitoring Suppl (ACCU-CHEK GUIDE) w/Device KIT, 1 each by Does not apply route 2 (two) times daily., Disp: 1 kit, Rfl: 1  busPIRone (BUSPAR) 15 MG tablet, Take 1 tablet (15 mg total) by mouth 2 (two) times daily., Disp: 60 tablet, Rfl: 3   cholecalciferol (VITAMIN D3) 25 MCG (1000 UNIT) tablet, Take 1,000 Units by mouth daily., Disp: , Rfl:    clopidogrel (PLAVIX) 75 MG tablet, TAKE 1 TABLET BY MOUTH IN THE MORNING, Disp: 90 tablet, Rfl: 2   Continuous Blood Gluc Sensor (FREESTYLE LIBRE 3 SENSOR) MISC, Place 1 sensor on the skin every 14 days. Use to check glucose continuously, Disp: 2 each, Rfl: 11   DULoxetine (CYMBALTA) 20 MG capsule, Take 1 capsule (20 mg total) by mouth daily., Disp: 30 capsule, Rfl: 3   Evolocumab (REPATHA SURECLICK) 140 MG/ML SOAJ, INJECT 1 PEN INTO THE SKIN EVERY 14 DAYS, Disp: 6 mL, Rfl: 3   exemestane (AROMASIN) 25 MG tablet, TAKE 1 TABLET BY MOUTH ONCE DAILY AFTER BREAKFAST, Disp: 90 tablet, Rfl: 3   ezetimibe (ZETIA) 10 MG tablet, Take 1 tablet by mouth once daily, Disp: 90  tablet, Rfl: 2   ferrous sulfate 325 (65 FE) MG tablet, Take 1 tablet (325 mg total) by mouth every other day. (Patient not taking: Reported on 03/19/2023), Disp: 30 tablet, Rfl: 3   Fluticasone-Umeclidin-Vilant (TRELEGY ELLIPTA) 100-62.5-25 MCG/ACT AEPB, INHALE 1 PUFF ONCE DAILY, Disp: 180 each, Rfl: 0   furosemide (LASIX) 40 MG tablet, Take 1 tablet (40 mg total) by mouth as needed for fluid or edema. (Patient not taking: Reported on 03/19/2023), Disp: 180 tablet, Rfl: 1   gabapentin (NEURONTIN) 300 MG capsule, TAKE 2 CAPSULES BY MOUTH IN THE MORNING AND 3 CAPSULES AT BEDTIME, Disp: 150 capsule, Rfl: 0   glucose blood (ACCU-CHEK GUIDE) test strip, Check blood sugar 2 times per day, Disp: 100 each, Rfl: 9   hydrocortisone cream 1 %, Apply 1 Application topically daily as needed for itching., Disp: , Rfl:    LANTUS SOLOSTAR 100 UNIT/ML Solostar Pen, Inject 30 Units into the skin at bedtime., Disp: , Rfl:    losartan-hydrochlorothiazide (HYZAAR) 100-25 MG tablet, Take 1 tablet by mouth once daily, Disp: 90 tablet, Rfl: 0   metFORMIN (GLUCOPHAGE-XR) 500 MG 24 hr tablet, Take 1 tablet by mouth twice daily, Disp: 180 tablet, Rfl: 0   metoprolol succinate (TOPROL-XL) 50 MG 24 hr tablet, TAKE 1 TABLET BY MOUTH ONCE DAILY WITH MEALS OR  IMMEDIATELY  FOLLOWING  A  MEAL, Disp: 90 tablet, Rfl: 2   montelukast (SINGULAIR) 10 MG tablet, Take 1 tablet (10 mg total) by mouth at bedtime., Disp: 90 tablet, Rfl: 2   Semaglutide,0.25 or 0.5MG /DOS, (OZEMPIC, 0.25 OR 0.5 MG/DOSE,) 2 MG/3ML SOPN, Inject 0.25 mg weekly for 4 weeks, then increase to 0.5 mg weekly, Disp: 3 mL, Rfl: 2   senna-docusate (SENNA-TIME S) 8.6-50 MG tablet, Take 1 tablet by mouth at bedtime as needed for mild constipation., Disp: 30 tablet, Rfl: 1   traZODone (DESYREL) 50 MG tablet, Take 1-2 tablets (50-100 mg total) by mouth at bedtime., Disp: 30 tablet, Rfl: 3   VENTOLIN HFA 108 (90 Base) MCG/ACT inhaler, INHALE 2 PUFFS BY MOUTH EVERY 6 HOURS AS  NEEDED FOR WHEEZING FOR SHORTNESS OF BREATH, Disp: 18 g, Rfl: 3  Review of Systems:    MSK: Patient endorses back pain  Physical Exam:  Vitals:   05/20/23 1428  BP: (!) 124/48  Pulse: 82  Temp: 98.7 F (37.1 C)  TempSrc: Oral  SpO2: 94%  Weight: (!) 302 lb 12.8 oz (137.3 kg)  Height: 5\' 3"  (1.6 m)  General: Patient is sitting comfortably in the room  Eyes: Pupils equal and reactive to light, EOM intact  Head: Normocephalic, atraumatic  Neck: Supple, nontender, full range of motion, No JVD Cardio: Regular rate and rhythm, no murmurs, rubs or gallops. 2+ pulses to bilateral upper and lower extremities  Chest: No chest tenderness Pulmonary: Clear to ausculation bilaterally with no rales, rhonchi, and crackles  Back: Full range of motion of the entire back on extension, flexion, rotation, sidebending. No midline spinal tenderness, step off, or deformity. Right lumbar paraspinal tenderness with tenderness on the right gluteal muscle. Positive right straight leg raise  Assessment & Plan:   Sciatica Patient presents for acute on chronic low back pain.  She states over the past week her back pain has become worse.  She states that this is in her right lower back.  This is a throbbing type pain that shoots down her right leg.  Movement makes this worse.  She states offloading the right leg helps.  She states she has been having trouble sleeping at night.  She denies any numbness, tingling, saddle anesthesia, incontinence, or fever.  She states she has tried Tylenol with some relief.  She has tried stretches with some relief.  She also has tried gabapentin with some relief.  She states that it has become a lot worse, and wanted to be evaluated.  On my exam, patient has full range of motion of the entire back on extension, flexion, rotation, sidebending.  Patient has no midline spinal tenderness.  She does have right lumbar paraspinal tenderness as well as tenderness on the right gluteal muscle.   Patient also has positive right straight leg raise.  Patient likely has sciatica.  Plan: -Patient given sciatica rehab exercises -Patient encouraged to go to select adult activity center -Short course of naproxen -Continue Tylenol -Continue gabapentin -Did warn patient about side effects of naproxen, and if she has any symptoms to call back next-follow-up in 1 week if not improved  Patient discussed with Dr. Marquita Palms, DO PGY-1 Internal Medicine Resident  Pager: [phone]

## 2023-05-20 NOTE — Assessment & Plan Note (Signed)
Patient presents for acute on chronic low back pain.  She states over the past week her back pain has become worse.  She states that this is in her right lower back.  This is a throbbing type pain that shoots down her right leg.  Movement makes this worse.  She states offloading the right leg helps.  She states she has been having trouble sleeping at night.  She denies any numbness, tingling, saddle anesthesia, incontinence, or fever.  She states she has tried Tylenol with some relief.  She has tried stretches with some relief.  She also has tried gabapentin with some relief.  She states that it has become a lot worse, and wanted to be evaluated.  On my exam, patient has full range of motion of the entire back on extension, flexion, rotation, sidebending.  Patient has no midline spinal tenderness.  She does have right lumbar paraspinal tenderness as well as tenderness on the right gluteal muscle.  Patient also has positive right straight leg raise.  Patient likely has sciatica.  Plan: -Patient given sciatica rehab exercises -Patient encouraged to go to select adult activity center -Short course of naproxen -Continue Tylenol -Continue gabapentin -Did warn patient about side effects of naproxen, and if she has any symptoms to call back next-follow-up in 1 week if not improved

## 2023-05-20 NOTE — Patient Instructions (Addendum)
[name], [name] you for allowing me to take part in your care today.  Here are your instructions.  1. For your back pain please follow these instructions. Please start trying to exercises that I printed out for you. Please start taking naproxen for the pain. You can take 1 tablet in the morning and 1 tablet at night. If you notice any bleeding when you have a bowel movement or if you start throwing up blood you need to stop taking it.   2. Please try to use a heating pad. If this does not get better, please come back in about 1-2 weeks and at that time we can reassess and try some other options.  3. Remember [name] which has the arthritis aquatic class which can really help you with this. It is on  [address] [place], [place] [address] You can call them at [phone] and you can ask them about their aquatic classes.  4. If you develop any incontinence episodes or start feeling number from the waist down, you need to come back in to be evaluated.   Thank you, Dr. Allena Katz  If you have any other questions please contact the internal medicine clinic at [phone]

## 2023-05-21 ENCOUNTER — Other Ambulatory Visit: Payer: Medicaid Other | Admitting: Pharmacist

## 2023-05-21 NOTE — Progress Notes (Signed)
05/21/2023 Name: [name] MRN: [id number] DOB: [birth year]  Chief Complaint  Patient presents with   Medication Management   Diabetes    [name] is a 62 y.o. year old female who presented for a telephone visit.   They were referred to the pharmacist by their PCP for assistance in managing diabetes and complex medication management.   Patient is participating in a Managed Medicaid Plan:  Yes  Subjective:  Care Team: Primary Care Provider: Evlyn Kanner, MD ; Next Scheduled Visit: not scheduled  Medication Access/Adherence  Current Pharmacy:  [facility] [address] [place] - [address] [place] [address] Phone: [phone] Fax: [phone]   Patient reports affordability concerns with their medications: No  Patient reports access/transportation concerns to their pharmacy: No  Patient reports adherence concerns with their medications:  No    Reports appreciation of the support of the care management team    Diabetes:  Current medications: metformin XR 500 mg twice daily, Ozempic 0.5 mg weekly.   Date of Download: 5/10-5/23/24 % Time CGM is active: 98% Average Glucose: 160 mg/dL Glucose Management Indicator: 7.1  Glucose Variability: 21 (goal <36%) Time in Goal:  - Time in range 70-180: 77% - Time above range: 23% - Time below range: 0%  Patient denies hypoglycemic s/sx including dizziness, shakiness, sweating. Patient denies hyperglycemic symptoms including polyuria, polydipsia, polyphagia, nocturia, neuropathy, blurred vision.   Objective:  Lab Results  Component Value Date   HGBA1C 6.6 (H) 01/08/2023    Lab Results  Component Value Date   CREATININE 0.89 01/08/2023   BUN 20 01/08/2023   NA 136 01/08/2023   K 3.9 01/08/2023   CL 98 01/08/2023   CO2 29 01/08/2023    Lab Results  Component Value Date   CHOL 165 12/25/2022   HDL 49 12/25/2022   LDLCALC 101 (H)  12/25/2022   LDLDIRECT 162.0 10/17/2013   TRIG 78 12/25/2022   CHOLHDL 3.4 12/25/2022    Medications Reviewed Today     Reviewed by Alden Hipp, RPH-CPP (Pharmacist) on 05/21/23 at 1311  Med List Status: <None>   Medication Order Taking? Sig Documenting Provider Last Dose Status Informant  Accu-Chek FastClix Lancets MISC [id number] Yes Check blood sugar two times a day Arnetha Courser, MD Taking Active Self  acetaminophen (TYLENOL) 500 MG tablet [id number] Yes Take 1,000 mg by mouth every 6 (six) hours as needed for moderate pain. [provider] Taking Active Self  albuterol (PROVENTIL) (2.5 MG/3ML) 0.083% nebulizer solution [id number] Yes USE 1 VIAL IN NEBULIZER EVERY 4 HOURS AS NEEDED FOR WHEEZING OR SHORTNESS OF BREATH Amponsah, Flossie Buffy, MD Taking Active Self  amLODipine (NORVASC) 5 MG tablet [id number] Yes Take 1 tablet (5 mg total) by mouth daily. Evlyn Kanner, MD Taking Active   ASPIRIN ADULT LOW STRENGTH 81 MG EC tablet [id number] Yes TAKE 1 Tablet BY MOUTH ONCE DAILY Arnetha Courser, MD Taking Active Self  atorvastatin (LIPITOR) 80 MG tablet [id number] Yes Take 1 tablet (80 mg total) by mouth daily. Evlyn Kanner, MD Taking Active   B-D UF III MINI PEN NEEDLES 31G X 5 MM MISC [id number] Yes USE AS DIRECTED DAILY Miguel Aschoff, MD Taking Active Self  Blood Glucose Monitoring Suppl (ACCU-CHEK GUIDE) w/Device KIT [id number] Yes 1 each by Does not apply route 2 (two) times daily. Arnetha Courser, MD Taking Active Self  busPIRone (BUSPAR) 15 MG tablet [id number] Yes Take 1 tablet (15 mg total)  by mouth 2 (two) times daily. Shanna Cisco, NP Taking Active   cholecalciferol (VITAMIN D3) 25 MCG (1000 UNIT) tablet [id number] Yes Take 1,000 Units by mouth daily. [provider] Taking Active Self  clopidogrel (PLAVIX) 75 MG tablet [id number] Yes TAKE 1 TABLET BY MOUTH IN THE Wylie Hail, MD Taking Active   Continuous Blood Gluc Sensor (FREESTYLE  LIBRE 3 SENSOR) [place] [id number] Yes Place 1 sensor on the skin every 14 days. Use to check glucose continuously Evlyn Kanner, MD Taking Active Self  DULoxetine (CYMBALTA) 20 MG capsule [id number] Yes Take 1 capsule (20 mg total) by mouth daily. Shanna Cisco, NP Taking Active   Evolocumab [facility] SURECLICK) 140 MG/ML Ivory Broad [id number] Yes INJECT 1 PEN INTO THE SKIN EVERY 14 DAYS Kathleene Hazel, MD Taking Active   exemestane (AROMASIN) 25 MG tablet [id number] Yes TAKE 1 TABLET BY MOUTH ONCE DAILY AFTER Philippa Sicks, MD Taking Active Self  ezetimibe (ZETIA) 10 MG tablet [id number] Yes Take 1 tablet by mouth once daily Swinyer, Zachary George, NP Taking Active   ferrous sulfate 325 (65 FE) MG tablet [id number] No Take 1 tablet (325 mg total) by mouth every other day.  Patient not taking: Reported on 05/21/2023   Merrilyn Puma, MD Not Taking Active Self    Discontinued 05/09/20 1429   Fluticasone-Umeclidin-Vilant (TRELEGY ELLIPTA) 100-62.5-25 MCG/ACT AEPB [id number] Yes INHALE 1 PUFF ONCE DAILY Evlyn Kanner, MD Taking Active   furosemide (LASIX) 40 MG tablet [id number] No Take 1 tablet (40 mg total) by mouth as needed for fluid or edema.  Patient not taking: Reported on 03/19/2023   Chauncey Mann, DO Not Taking Active Self  gabapentin (NEURONTIN) 300 MG capsule [id number] Yes TAKE 2 CAPSULES BY MOUTH IN THE MORNING AND 3 CAPSULES AT BEDTIME Evlyn Kanner, MD Taking Active            Med Note Clearance Coots, Alanda Slim May 21, 2023  1:10 PM) Taking 1 QAM and 3 QPM  glucose blood (ACCU-CHEK GUIDE) test strip [id number]  Check blood sugar 2 times per day Evlyn Kanner, MD  Active Self  hydrocortisone cream 1 % [id number]  Apply 1 Application topically daily as needed for itching. [provider]  Active Self  LANTUS SOLOSTAR 100 UNIT/ML Solostar Pen [id number] No Inject 30 Units into the skin at bedtime.  Patient not taking: Reported on 05/21/2023   [provider] Not Taking Active Self  losartan-hydrochlorothiazide (HYZAAR) 100-25 MG tablet [id number] Yes Take 1 tablet by mouth once daily Adron Bene, MD Taking Active   metFORMIN (GLUCOPHAGE-XR) 500 MG 24 hr tablet [id number] Yes Take 1 tablet by mouth twice daily Evlyn Kanner, MD Taking Active   metoprolol succinate (TOPROL-XL) 50 MG 24 hr tablet [id number] Yes TAKE 1 TABLET BY MOUTH ONCE DAILY WITH MEALS OR  IMMEDIATELY  FOLLOWING  A  MEAL Evlyn Kanner, MD Taking Active Self  montelukast (SINGULAIR) 10 MG tablet [id number] Yes Take 1 tablet (10 mg total) by mouth at bedtime. Evlyn Kanner, MD Taking Active Self  Semaglutide,0.25 or 0.5MG /DOS, (OZEMPIC, 0.25 OR 0.5 MG/DOSE,) 2 MG/3ML SOPN [id number] Yes Inject 0.25 mg weekly for 4 weeks, then increase to 0.5 mg weekly Steffanie Rainwater, MD Taking Active   senna-docusate (SENNA-TIME S) 8.6-50 MG tablet [id number] No Take 1 tablet by mouth at bedtime as needed for mild constipation.  Patient not taking: Reported on 05/21/2023   Merrilyn Puma, MD Not Taking Active Self  traZODone (DESYREL)  50 MG tablet [id number] Yes Take 1-2 tablets (50-100 mg total) by mouth at bedtime. Shanna Cisco, NP Taking Active   VENTOLIN HFA 108 (90 Base) MCG/ACT inhaler [id number]  INHALE 2 PUFFS BY MOUTH EVERY 6 HOURS AS NEEDED FOR WHEEZING FOR SHORTNESS OF Alben Spittle, MD  Active Self              Assessment/Plan:   Diabetes: - Currently uncontrolled - Recommend to increase Ozempic to 1 mg weekly. Will discuss with patient's team at [facility]. She is due for follow up in mid June, she will call and schedule.  - Recommend to continue metformin and CGM at this time     Follow Up Plan: phone call in ~ 8 weeks  Catie Eppie Gibson, PharmD, BCACP, CPP [facility] [phone]

## 2023-05-21 NOTE — Patient Instructions (Signed)
Anarosa,   I will talk to the team about increasing Ozempic to 1 mg weekly when you finish your current supply.   Take care!  Catie Eppie Gibson, PharmD, BCACP, CPP [facility] [phone]

## 2023-05-23 NOTE — Progress Notes (Signed)
Internal Medicine Clinic Attending  Case discussed with Dr. [name]  At the time of the visit.  We reviewed the resident's history and exam and pertinent patient test results.  I agree with the assessment, diagnosis, and plan of care documented in the resident's note.  

## 2023-05-30 DIAGNOSIS — Z419 Encounter for procedure for purposes other than remedying health state, unspecified: Secondary | ICD-10-CM | POA: Diagnosis not present

## 2023-06-02 ENCOUNTER — Encounter: Payer: Self-pay | Admitting: *Deleted

## 2023-06-03 ENCOUNTER — Telehealth: Payer: Self-pay | Admitting: *Deleted

## 2023-06-03 ENCOUNTER — Other Ambulatory Visit: Payer: Self-pay

## 2023-06-03 ENCOUNTER — Ambulatory Visit (INDEPENDENT_AMBULATORY_CARE_PROVIDER_SITE_OTHER): Payer: Medicaid Other

## 2023-06-03 VITALS — BP 127/59 | HR 82 | Temp 98.1°F | Ht 63.0 in | Wt 303.9 lb

## 2023-06-03 DIAGNOSIS — Z7985 Long-term (current) use of injectable non-insulin antidiabetic drugs: Secondary | ICD-10-CM | POA: Diagnosis not present

## 2023-06-03 DIAGNOSIS — Z1211 Encounter for screening for malignant neoplasm of colon: Secondary | ICD-10-CM

## 2023-06-03 DIAGNOSIS — M5431 Sciatica, right side: Secondary | ICD-10-CM

## 2023-06-03 DIAGNOSIS — Z7984 Long term (current) use of oral hypoglycemic drugs: Secondary | ICD-10-CM | POA: Diagnosis not present

## 2023-06-03 DIAGNOSIS — E119 Type 2 diabetes mellitus without complications: Secondary | ICD-10-CM

## 2023-06-03 LAB — POCT GLYCOSYLATED HEMOGLOBIN (HGB A1C): Hemoglobin A1C: 7.3 % — AB (ref 4.0–5.6)

## 2023-06-03 LAB — GLUCOSE, CAPILLARY: Glucose-Capillary: 161 mg/dL — ABNORMAL HIGH (ref 70–99)

## 2023-06-03 MED ORDER — OZEMPIC (0.25 OR 0.5 MG/DOSE) 2 MG/3ML ~~LOC~~ SOPN: 6.0000 mL | PEN_INJECTOR | SUBCUTANEOUS | 2 refills | Status: DC

## 2023-06-03 MED ORDER — SEMAGLUTIDE (1 MG/DOSE) 4 MG/3ML ~~LOC~~ SOPN
1.0000 mg | PEN_INJECTOR | SUBCUTANEOUS | 0 refills | Status: DC
Start: 2023-06-03 — End: 2023-07-20

## 2023-06-03 NOTE — Assessment & Plan Note (Signed)
Patient presents for follow-up of her sciatica.  She has been taking naproxen as needed for pain and endorses much improvement with her discomfort.  We discussed the possible side effects of naproxen including GI bleed and stomach ulcers.  Educated patient on warning signs and to let us know if she has any difficulties as well as recommended she use sparingly.

## 2023-06-03 NOTE — Assessment & Plan Note (Signed)
Referral sent for colonoscopy

## 2023-06-03 NOTE — Telephone Encounter (Signed)
Call from pharmacy requesting Clarity on the directions and order written for Ozempic.  Pharmacist Neysa Bonito can be reached at [phone],

## 2023-06-03 NOTE — Assessment & Plan Note (Addendum)
Patient presents for follow up of diabetes. Last A1c 6.6. On Ozempic and metformin. Denies polyuria, polydipsia. Not having and GI side effects with her medications. Requests increasing GLP-1 agonist dose. -repeat A1c -urine microalb/cr ratio -increase Ozempic to 1 mg weekly -continue metformin  Addendum: called patient to discuss increased A1c. Increased Ozempic and added [place] but patient states she could not tolerate Farxiga due to nausea. Discuss alternatives at next follow up.

## 2023-06-03 NOTE — Patient Instructions (Signed)
[name], it was a pleasure seeing you today!  Today we discussed: Diabetes - take ozempic 1 mg weekly Back pain - Take naproxen sparingly.  They will call you to schedule colonoscopy.  I have ordered the following labs today:   Lab Orders         Microalbumin / Creatinine Urine Ratio         POC Hbg A1C      Tests ordered today:  Colonoscopy  Referrals ordered today:    Referral Orders         Ambulatory referral to Gastroenterology      I have ordered the following medication/changed the following medications:   Stop the following medications: Medications Discontinued During This Encounter  Medication Reason   Semaglutide,0.25 or 0.5MG /DOS, (OZEMPIC, 0.25 OR 0.5 MG/DOSE,) 2 MG/3ML SOPN Reorder     Start the following medications: Meds ordered this encounter  Medications   Semaglutide,0.25 or 0.5MG /DOS, (OZEMPIC, 0.25 OR 0.5 MG/DOSE,) 2 MG/3ML SOPN    Sig: Inject 6 mLs into the skin once a week for 4 doses. Take 1 mg weekly    Dispense:  24 mL    Refill:  2     Follow-up: 3 months   Please make sure to arrive 15 minutes prior to your next appointment. If you arrive late, you may be asked to reschedule.   We look forward to seeing you next time. Please call our clinic at [phone] if you have any questions or concerns. The best time to call is Monday-Friday from 9am-4pm, but there is someone available 24/7. If after hours or the weekend, call the main hospital number and ask for the Internal Medicine Resident On-Call. If you need medication refills, please notify your pharmacy one week in advance and they will send [place] a request.  Thank you for letting us take part in your care. Wishing you the best!  Thank you, Adron Bene, MD

## 2023-06-03 NOTE — Progress Notes (Addendum)
CC: Follow-up back pain  HPI:  [name] is a 62 y.o. female with past medical history as below who presents for follow-up of her back pain.  Please see detailed assessment plan for HPI.  Past Medical History:  Diagnosis Date   Anemia    Anxiety    Aortic valve stenosis, severe    Arthritis    PAIN AND SEVERE OA LEFT KNEE ; S/P RIGHT TKA ON 02/03/12; HAS LOWER BACK PAIN-UNABLE TO STAND MORE THAN 10 MIN; ARTHRITIS "ALL OVER"   Asthma    Blood transfusion    2013[facility]   Breast cancer in female [facility])    Right   CAD (coronary artery disease)    Cath 2010 with DES x 1 RCA-- PT'S CARDIOLOGIST IS DR. MCALHANY   Chronic diastolic congestive heart failure (HCC)    COPD (chronic obstructive pulmonary disease) (HCC)    Depression    Diabetes mellitus DIAGNOSED IN2010   Dyspnea    with much ambulation   Eczema    on back   Headache    migraines younger- rare now 02/07/21   Heart murmur    no current problems   History of hiatal hernia    History of kidney stones    passed or blasted   Hyperlipidemia    Hypertension    Morbid obesity with body mass index of 60.0-69.9 in adult [facility])    Myocardial infarction (HCC)    PT THINKS SHE WAS DX WITH MI AT THE TIME OF HEART STENTING   Neuromuscular disorder (HCC)    bilateral arm/hands   Oxygen dependent    uses 3L oxygen night/prn   Personal history of radiation therapy    Pneumonia    Pulmonary embolism (HCC) 02/08/2012   S/P RT TOTAL KNEE ON 02/03/12--ON 02/08/12--DEVELOPED ACUTE SOB AND CHEST PAIN--AND DIAGNOSED WITH  PULMONARY EMBOLUS AND PNEUMONIA   Restless leg syndrome    Sleep apnea    uses 3 liters O2 at night as needed   Uterine fibroid    NO PROBLEMS AT PRESENT FROM THE FIBROIDS-STATES SHE IS POST MENOPAUSAL-LAST MENSES 2010 EXCEPT FOR EPISODE THIS YR OF BLEEDING RELATED TO FIBROIDS.   Weakness    BOTH HANDS - S/P BILATERAL CARPAL TUNNEL RELEASE--BUT STILL HAS WEAKNESS--OFTEN DROPS THINGS   Review of Systems:  Please see detailed assessment and plan for pertinent ROS.  Physical Exam:  Vitals:   06/03/23 0928  BP: (!) 127/59  Pulse: 82  Temp: 98.1 F (36.7 C)  TempSrc: Oral  SpO2: 96%  Weight: (!) 303 lb 14.4 oz (137.8 kg)  Height: 5\' 3"  (1.6 m)   Physical Exam Constitutional:      General: She is not in acute distress.    Comments: Morbidly obese.  Presents in wheelchair.  HENT:     Head: Normocephalic and atraumatic.  Eyes:     Extraocular Movements: Extraocular movements intact.  Cardiovascular:     Rate and Rhythm: Normal rate and regular rhythm.     Heart sounds: No murmur heard. Pulmonary:     Effort: Pulmonary effort is normal.     Breath sounds: No wheezing or rales.  Abdominal:     General: There is no distension.     Palpations: Abdomen is soft.  Musculoskeletal:     Cervical back: Neck supple.     Right lower leg: No edema.     Left lower leg: No edema.  Skin:    General: Skin is warm and  dry.  Neurological:     Mental Status: She is alert and oriented to person, place, and time.  Psychiatric:        Mood and Affect: Mood normal.        Behavior: Behavior normal.      Assessment & Plan:   See Encounters Tab for problem based charting.  Type 2 diabetes mellitus without complication, without long-term current use of insulin [facility]) Patient presents for follow up of diabetes. Last A1c 6.6. On Ozempic and metformin. Denies polyuria, polydipsia. Not having and GI side effects with her medications. Requests increasing GLP-1 agonist dose. -repeat A1c -urine microalb/cr ratio -increase Ozempic to 1 mg weekly -continue metformin  Addendum: called patient to discuss increased A1c. Increased Ozempic and added [place] but patient states she could not tolerate Farxiga due to nausea. Discuss alternatives at next follow up.  Sciatica Patient presents for follow-up of her sciatica.  She has been taking naproxen as needed for pain and endorses much improvement with her  discomfort.  We discussed the possible side effects of naproxen including GI bleed and stomach ulcers.  Educated patient on warning signs and to let us know if she has any difficulties as well as recommended she use sparingly.  Colon cancer screening Referral sent for colonoscopy.  Patient discussed with Dr. Mayford Knife

## 2023-06-04 LAB — MICROALBUMIN / CREATININE URINE RATIO
Creatinine, Urine: 128.1 mg/dL
Microalb/Creat Ratio: 70 mg/g creat — ABNORMAL HIGH (ref 0–29)
Microalbumin, Urine: 89.8 ug/mL

## 2023-06-04 MED ORDER — DAPAGLIFLOZIN PROPANEDIOL 5 MG PO TABS: 5.0000 mg | ORAL_TABLET | Freq: Every day | ORAL | 2 refills | Status: DC

## 2023-06-04 NOTE — Addendum Note (Signed)
Addended by: Adron Bene on: 06/04/2023 02:01 PM   Modules accepted: Orders

## 2023-06-11 NOTE — Progress Notes (Signed)
Internal Medicine Clinic Attending  Case discussed with Dr. White  At the time of the visit.  We reviewed the resident's history and exam and pertinent patient test results.  I agree with the assessment, diagnosis, and plan of care documented in the resident's note.  

## 2023-06-12 ENCOUNTER — Telehealth: Payer: Self-pay | Admitting: *Deleted

## 2023-06-12 NOTE — Telephone Encounter (Signed)
Call from patient states that her [name] is making her sick.  Was prescribed at last visit.  Took last dose on 06/08/2023.  Patient has just come back from vacation.  Took in morning before breakfast and then got nauseated during the day.  Stated will not take anymore.  Patient can be reached at [phone].

## 2023-06-16 ENCOUNTER — Other Ambulatory Visit: Payer: Medicaid Other | Admitting: Obstetrics and Gynecology

## 2023-06-16 ENCOUNTER — Encounter: Payer: Self-pay | Admitting: Obstetrics and Gynecology

## 2023-06-16 NOTE — Patient Instructions (Signed)
Hi [name], I am so glad you had a great trip to the beach!!  Have a great day!!  [name] was given information about Medicaid Managed Care team care coordination services as a part of their [facility] Medicaid benefit. [name] verbally consented to engagement with the [facility] Managed Care team.   If you are experiencing a medical emergency, please call 911 or report to your local emergency department or urgent care.   If you have a non-emergency medical problem during routine business hours, please contact your provider's office and ask to speak with a nurse.   For questions related to your [facility] health plan, please call: [phone] or go here:[email or web address]  If you would like to schedule transportation through your [facility] plan, please call the following number at least 2 days in advance of your appointment: [phone].   You can also use the MTM portal or MTM mobile app to manage your rides. Reimbursement for transportation is available through [facility]! For the portal, please go to [email or web address]  Call the [facility] Crisis Line at [phone], at any time, 24 hours a day, 7 days a week. If you are in danger or need immediate medical attention call 911.  If you would like help to quit smoking, call 1-800-QUIT-NOW (623-589-7571) OR Espaol: 1-855-Djelo-Ya (4-132-440-1027) o para ms informacin haga clic aqu or Text READY to 253-664 to register via text  [name] - following are the goals we discussed in your visit today:   Goals Addressed    Timeframe:  Long-Range Goal Priority:  High Start Date:      04/14/22                       Expected End Date:     ongoing                  Follow Up Date 07/21/23   - schedule appointment for flu shot - schedule appointment for vaccines needed due to my age or health - schedule recommended health tests (blood work, mammogram, colonoscopy, pap test) - schedule and keep  appointment for annual check-up    Why is this important?   Screening tests can find diseases early when they are easier to treat.  Your doctor or nurse will talk with you about which tests are important for you.  Getting shots for common diseases like the flu and shingles will help prevent them.  06/16/23:  Awaiting colonoscopy referral.  Recent PCP visit 6/5.  Patient verbalizes understanding of instructions and care plan provided today and agrees to view in MyChart. Active MyChart status and patient understanding of how to access instructions and care plan via MyChart confirmed with patient.     The Managed Medicaid care management team will [name] out to the patient again over the next 30 business  days.  The  Patient  has been provided with contact information for the Managed Medicaid care management team and has been advised to call with any health related questions or concerns.   [name] RN, BSN   [facility] Care Management Coordinator - Managed Medicaid High Risk [phone]   Following is a copy of your plan of care:  Care Plan : RN Care Manager Plan of Care  Updates made by [name], RN since 06/16/2023 12:00 AM     Problem: Health Promotion or Disease Self-Management (General Plan of Care)   Priority: High  Onset Date: 04/14/2022  Long-Range Goal: Chronic Disease Management and Care Coordination Needs   Start Date: 04/14/2022  Expected End Date: 09/16/2023  Recent Progress: Not on track  Priority: High  Note:   Current Barriers:  Knowledge Deficits related to plan of care for management of Anxiety and Depression along with other chronic health conditions, HTN, DM, HF, asthma, COPD, OSA, arthritis, GERD, CAD, HLD, h/o breast cancer, RLS Chronic Disease Management support and education needs related to Anxiety and Depression. DM, HTN, HF, asthma, OSA, arthritis, GERD, CAD, HLD, h/o breast cancer, RLS. 06/16/23:  Right sciatica improved  with Naproxen prescribed by PCP.  Blood sugar average 144.  BP stable.  Last therapy appt this week. RNCM Clinical Goal(s):  Patient will verbalize understanding of plan for management of Anxiety and Depression, DM, HTN  as evidenced by patient report take all medications exactly as prescribed and will call provider for medication related questions as evidenced by patient report demonstrate understanding of rationale for each prescribed medication as evidenced by patient report attend all scheduled medical appointments  as evidenced by patient report continue to work with RN Care Manager to address care management and care coordination needs related to  Anxiety and Depression, DM, HTN as evidenced by adherence to CM Team Scheduled appointments work with Child psychotherapist to address  related to the management of Mental Health Concerns  related to the management of Anxiety and Depression as evidenced by review of EMR and patient or Child psychotherapist report through collaboration with Medical illustrator, provider, and care team.   Interventions: Inter-disciplinary care team collaboration (see longitudinal plan of care) Evaluation of current treatment plan related to  self management and patient's adherence to plan as established by provider   (Status:  New goal.)  Long Term Goal Evaluation of current treatment plan related to Anxiety and Depression, Mental Health Concerns  self-management and patient's adherence to plan as established by provider. Discussed plans with patient for ongoing care management follow up and provided patient with direct contact information for care management team Advised patient to contact provider for any additional DME needs, CBG, and possible general surgeon referral. Reviewed medications with patient Collaborated with LCSW regarding anxiety/depression Reviewed scheduled/upcoming provider appointments Advised patient, providing education and rationale, to check cbg and record,  calling provider  for findings outside established parameters  Social Work referral for anxiety/depression Discussed plans with patient for ongoing care management follow up and provided patient with direct contact information for care management team Assessed social determinant of health barriers Collaborated with Lubertha Sayres Managed Medicaid Liaison regarding DME-motorized wheelchair and lift chair. LCSW appointment rescheduled. Collaborated with Pharmacist. Pharmacy referral for polypharmacy at patient's request-completed  Asthma: (Status:New goal.) Long Term Goal Advised patient to track and manage Asthma triggers Advised patient to engage in light exercise as tolerated 3-5 days a week to aid in the the management of Asthma Provided education about and advised patient to utilize infection prevention strategies to reduce risk of respiratory infection Discussed the importance of adequate rest and management of fatigue with Asthma Assessed social determinant of health barriers   CAD Interventions: (Status:  New goal.) Long Term Goal Assessed understanding of CAD diagnosis Medications reviewed including medications utilized in CAD treatment plan Provided education on importance of blood pressure control in management of CAD Provided education on Importance of limiting foods high in cholesterol Counseled on importance of regular laboratory monitoring as prescribed Counseled on the importance of exercise goals with target of 150 minutes per week Reviewed Importance  of taking all medications as prescribed Reviewed Importance of attending all scheduled provider appointments Advised to report any changes in symptoms or exercise tolerance Assessed social determinant of health barriers  Heart Failure Interventions:  (Status:  New goal.) Long Term Goal Basic overview and discussion of pathophysiology of Heart Failure reviewed Provided education on low sodium diet Assessed need for  readable accurate scales in home Provided education about placing scale on hard, flat surface Advised patient to weigh each morning after emptying bladder Discussed importance of daily weight and advised patient to weigh and record daily Discussed the importance of keeping all appointments with provider Provided patient with education about the role of exercise in the management of heart failure Assessed social determinant of health barriers   COPD Interventions:  (Status:  New goal.) Long Term Goal Advised patient to track and manage COPD triggers Advised patient to self assesses COPD action plan zone and make appointment with provider if in the yellow zone for 48 hours without improvement Advised patient to engage in light exercise as tolerated 3-5 days a week to aid in the the management of COPD Provided education about and advised patient to utilize infection prevention strategies to reduce risk of respiratory infection Discussed the importance of adequate rest and management of fatigue with COPD Assessed social determinant of health barriers  Hyperlipidemia Interventions:  (Status:  New goal.) Long Term Goal Medication review performed; medication list updated in electronic medical record.  Provider established cholesterol goals reviewed Counseled on importance of regular laboratory monitoring as prescribed Reviewed importance of limiting foods high in cholesterol Reviewed exercise goals and target of 150 minutes per week Assessed social determinant of health barriers    Diabetes Interventions:  (Status:  New goal.) Long Term Goal Assessed patient's understanding of A1c goal: <7% Reviewed medications with patient and discussed importance of medication adherence Reviewed scheduled/upcoming provider appointments  Advised patient, providing education and rationale, to check cbg and record, calling provider for findings outside established parameters Review of patient status, including  review of consultants reports, relevant laboratory and other test results, and medications completed Assessed social determinant of health barriers Lab Results  Component Value Date   HGBA1C 7.1 (A) 08/27/2022  HGB A1C  6.6 on 01/08/23 HGB A1C  7.3 on 06/03/23  Hypertension Interventions:  (Status:  New goal.) Long Term Goal Last practice recorded BP readings:  BP Readings from Last 3 Encounters:  09/12/22 133/69  08/27/22 (!) 109/58  07/01/22 137/75  12/25/22          124/60 03/18/23         130/50 06/03/23         127/59  Most recent eGFR/CrCl:  Lab Results  Component Value Date   EGFR 90 06/04/2022    No components found for: "CRCL"  Evaluation of current treatment plan related to hypertension self management and patient's adherence to plan as established by provider Reviewed medications with patient and discussed importance of compliance Discussed plans with patient for ongoing care management follow up and provided patient with direct contact information for care management team Advised patient, providing education and rationale, to monitor blood pressure daily and record, calling PCP for findings outside established parameters Reviewed scheduled/upcoming provider appointments including:  Assessed social determinant of health barriers   Patient Goals/Self-Care Activities: Take all medications as prescribed Attend all scheduled provider appointments Call pharmacy for medication refills 3-7 days in advance of running out of medications Perform all self care activities independently  Perform IADL's (  shopping, preparing meals, housekeeping, managing finances) independently Call provider office for new concerns or questions  Work with the social worker to address care coordination needs and will continue to work with the clinical team to address health care and disease management related needs Patient to f/u with provider regarding sciatica pain-completed  Follow Up Plan:  The  patient has been provided with contact information for the care management team and has been advised to call with any health related questions or concerns.  The care management team will [name] out to the patient again over the next 60 business  days.

## 2023-06-16 NOTE — Patient Outreach (Signed)
Medicaid Managed Care   Nurse Care Manager Note  06/16/2023 Name:  [name] MRN:  [id number] DOB:  1961/07/04  [name] is an 62 y.o. year old female who is a primary patient of [name] Kanner, MD.  The [facility] Managed Care Coordination team was consulted for assistance with:    Chronic healthcare management needs, COPD, h/o breast cancer, sciatica, OSA, GERD, CAD, arthritis, HLD, RLS, HTN, DM, HF, asthma, anxiety/depression/PTSD  [name] was given information about Medicaid Managed Care Coordination team services today. [name] Patient agreed to services and verbal consent obtained.  Engaged with patient by telephone for follow up visit in response to provider referral for case management and/or care coordination services.   Assessments/Interventions:  Review of past medical history, allergies, medications, health status, including review of consultants reports, laboratory and other test data, was performed as part of comprehensive evaluation and provision of chronic care management services.  SDOH (Social Determinants of Health) assessments and interventions performed: SDOH Interventions    Flowsheet Row Patient Outreach Telephone from 06/16/2023 in [place] POPULATION HEALTH DEPARTMENT Office Visit from 06/03/2023 in [facility] Patient Outreach Telephone from 05/12/2023 in [place] POPULATION HEALTH DEPARTMENT Patient Outreach Telephone from 05/04/2023 in [place] POPULATION HEALTH DEPARTMENT Patient Outreach Telephone from 04/01/2023 in [place] POPULATION HEALTH DEPARTMENT Patient Outreach Telephone from 03/12/2023 in [place] POPULATION HEALTH DEPARTMENT  SDOH Interventions        Food Insecurity Interventions Intervention Not Indicated -- -- -- -- --  Housing Interventions Intervention Not Indicated -- -- -- -- --  Alcohol Usage Interventions -- -- -- -- Intervention Not Indicated (Score <7) --  Depression  Interventions/Treatment  -- Currently on Treatment -- -- -- --  Financial Strain Interventions -- -- Intervention Not Indicated -- -- --  Stress Interventions -- -- -- [employer], Provide Counseling -- [employer], Provide Counseling     Care Plan  No Known Allergies  Medications Reviewed Today     Reviewed by [name] Chandler, RN (Registered Nurse) on 06/16/23 at 1044  Med List Status: <None>   Medication Order Taking? Sig Documenting Provider Last Dose Status Informant  Accu-Chek FastClix Lancets MISC [id number]  Check blood sugar two times a day [name] Courser, MD  Active Self  acetaminophen (TYLENOL) 500 MG tablet [id number]  Take 1,000 mg by mouth every 6 (six) hours as needed for moderate pain. [provider]  Active Self  albuterol (PROVENTIL) (2.5 MG/3ML) 0.083% nebulizer solution [id number]  USE 1 VIAL IN NEBULIZER EVERY 4 HOURS AS NEEDED FOR WHEEZING OR SHORTNESS OF BREATH [name] Rainwater, MD  Active Self  amLODipine (NORVASC) 5 MG tablet [id number]  Take 1 tablet (5 mg total) by mouth daily. [name] Kanner, MD  Active   ASPIRIN ADULT LOW STRENGTH 81 MG EC tablet [id number]  TAKE 1 Tablet BY MOUTH ONCE DAILY [name] Courser, MD  Active Self  atorvastatin (LIPITOR) 80 MG tablet [id number]  Take 1 tablet (80 mg total) by mouth daily. [name] Kanner, MD  Active   B-D UF III MINI PEN NEEDLES 31G X 5 MM MISC [id number]  USE AS DIRECTED DAILY [name] Aschoff, MD  Active Self  Blood Glucose Monitoring Suppl (ACCU-CHEK GUIDE) w/Device KIT [id number]  1 each by Does not apply route 2 (two) times daily. [name] Courser, MD  Active Self  busPIRone (BUSPAR) 15 MG tablet [id number]  Take 1 tablet (15 mg total) by mouth  2 (two) times daily. [name] Cisco, NP  Active   cholecalciferol (VITAMIN D3) 25 MCG (1000 UNIT) tablet [id number]  Take 1,000 Units by mouth daily. [provider]  Active Self  clopidogrel (PLAVIX) 75 MG  tablet [id number]  TAKE 1 TABLET BY MOUTH IN THE [name] Hail, MD  Active   Continuous Blood Gluc Sensor (FREESTYLE LIBRE 3 SENSOR) [place] [id number]  Place 1 sensor on the skin every 14 days. Use to check glucose continuously [name] Kanner, MD  Active Self  dapagliflozin propanediol (FARXIGA) 5 MG TABS tablet [id number] No Take 1 tablet (5 mg total) by mouth daily before breakfast.  Patient not taking: Reported on 06/16/2023   [name] Bene, MD Not Taking Active   DULoxetine (CYMBALTA) 20 MG capsule [id number]  Take 1 capsule (20 mg total) by mouth daily. [name] Cisco, NP  Active   Evolocumab (REPATHA SURECLICK) 140 MG/ML Ivory Broad [id number]  INJECT 1 PEN INTO THE SKIN EVERY 14 DAYS [name] Hazel, MD  Active   exemestane (AROMASIN) 25 MG tablet [id number]  TAKE 1 TABLET BY MOUTH ONCE DAILY AFTER [name] Sicks, MD  Active Self  ezetimibe (ZETIA) 10 MG tablet [id number]  Take 1 tablet by mouth once daily [name], [name] George, NP  Active   ferrous sulfate 325 (65 FE) MG tablet [id number]  Take 1 tablet (325 mg total) by mouth every other day.  Patient not taking: Reported on 05/21/2023   [name] Puma, MD  Active Self    Discontinued 05/09/20 1429   Fluticasone-Umeclidin-Vilant (TRELEGY ELLIPTA) 100-62.5-25 MCG/ACT AEPB [id number]  INHALE 1 PUFF ONCE DAILY [name] Kanner, MD  Active   furosemide (LASIX) 40 MG tablet [id number]  Take 1 tablet (40 mg total) by mouth as needed for fluid or edema.  Patient not taking: Reported on 03/19/2023   [name] Mann, DO  Active Self  gabapentin (NEURONTIN) 300 MG capsule [id number]  TAKE 2 CAPSULES BY MOUTH IN THE MORNING AND 3 CAPSULES AT BEDTIME [name] Kanner, MD  Active            Med Note Clearance [name], [name] T   Thu May 21, 2023  1:10 PM) Taking 1 QAM and 3 QPM  glucose blood (ACCU-CHEK GUIDE) test strip [id number]  Check blood sugar 2 times per day [name] Kanner, MD  Active Self  hydrocortisone cream 1 % [id number]   Apply 1 Application topically daily as needed for itching. [provider]  Active Self  losartan-hydrochlorothiazide (HYZAAR) 100-25 MG tablet [id number]  Take 1 tablet by mouth once daily [name] Bene, MD  Active   metFORMIN (GLUCOPHAGE-XR) 500 MG 24 hr tablet [id number]  Take 1 tablet by mouth twice daily [name] Kanner, MD  Active   metoprolol succinate (TOPROL-XL) 50 MG 24 hr tablet [id number]  TAKE 1 TABLET BY MOUTH ONCE DAILY WITH MEALS OR  IMMEDIATELY  FOLLOWING  A  MEAL [name] Kanner, MD  Active Self  montelukast (SINGULAIR) 10 MG tablet [id number]  Take 1 tablet (10 mg total) by mouth at bedtime. [name] Kanner, MD  Active Self  Semaglutide, 1 MG/DOSE, 4 MG/3ML SOPN [id number]  Inject 1 mg into the skin once a week. [name] Bene, MD  Active   senna-docusate (SENNA-TIME S) 8.6-50 MG tablet [id number]  Take 1 tablet by mouth at bedtime as needed for mild constipation.  Patient not taking: Reported on 05/21/2023   [name] Puma, MD  Active Self  traZODone (DESYREL) 50 MG tablet [id number]  Take 1-2 tablets (50-100 mg total)  by mouth at bedtime. [name] Cisco, NP  Active   VENTOLIN HFA 108 (90 Base) MCG/ACT inhaler [id number]  INHALE 2 PUFFS BY MOUTH EVERY 6 HOURS AS NEEDED FOR WHEEZING FOR SHORTNESS OF Alben Spittle, MD  Active Self           Patient Active Problem List   Diagnosis Date Noted   Colon cancer screening 06/03/2023   Abdominal hernia 03/19/2023   History of UTI 12/05/2022   COPD exacerbation (HCC) 11/16/2022   Diabetic neuropathy (HCC) 08/29/2022   Vaginal lesion 08/29/2022   PTSD (post-traumatic stress disorder) 06/24/2022   GAD (generalized anxiety disorder) 06/24/2022   Major depressive disorder, recurrent episode, moderate (HCC) 06/24/2022   Urinary incontinence 06/20/2022   Intertrigo 06/20/2022   Arthritis of left shoulder region    S/P reverse total shoulder arthroplasty, 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   Healthcare maintenance 11/17/2021    Spondylolisthesis, lumbar region 02/11/2021    Class: Acute   Malignant neoplasm of upper-outer quadrant of right breast in female, estrogen receptor positive (HCC) 06/10/2018   Restless leg syndrome 02/04/2018   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   Depression 08/06/2015   Hypertension associated with diabetes (HCC)    Aortic stenosis s/p TAVR    Chronic diastolic congestive heart failure (HCC)    COPD (chronic obstructive pulmonary disease) (HCC) 09/21/2013   Obstructive sleep apnea 09/21/2013   Nephrolithiasis 09/21/2013   Nocturnal hypoxemia 09/12/2013   Hyperlipidemia 11/30/2012   History of pulmonary embolus (PE) 07/09/2012   Normocytic anemia 02/08/2012   Type 2 diabetes mellitus without complication, without long-term current use of insulin (HCC) 02/08/2012   Asthma 02/08/2012   GERD (gastroesophageal reflux disease)    CAD (coronary artery disease) 12/08/2011   Conditions to be addressed/monitored per PCP order:  Chronic healthcare management needs, COPD, h/o breast cancer, sciatica, OSA, GERD, CAD, arthritis, HLD, RLS, HTN, DM, HF, asthma, anxiety/depression/PTSD  Care Plan : RN Care Manager Plan of Care  Updates made by [name] Chandler, RN since 06/16/2023 12:00 AM     Problem: Health Promotion or Disease Self-Management (General Plan of Care)   Priority: High  Onset Date: 04/14/2022     Long-Range Goal: Chronic Disease Management and Care Coordination Needs   Start Date: 04/14/2022  Expected End Date: 09/16/2023  Recent Progress: Not on track  Priority: High  Note:   Current Barriers:  Knowledge Deficits related to plan of care for management of Anxiety and Depression along with other chronic health conditions, HTN, DM, HF, asthma, COPD, OSA, arthritis, GERD, CAD, HLD, h/o breast cancer, RLS Chronic Disease Management support and education needs related to Anxiety and Depression. DM, HTN, HF, asthma, OSA, arthritis, GERD, CAD, HLD, h/o breast cancer,  RLS. 06/16/23:  Right sciatica improved with Naproxen prescribed by PCP.  Blood sugar average 144.  BP stable.  Last therapy appt this week. RNCM Clinical Goal(s):  Patient will verbalize understanding of plan for management of Anxiety and Depression, DM, HTN  as evidenced by patient report take all medications exactly as prescribed and will call provider for medication related questions as evidenced by patient report demonstrate understanding of rationale for each prescribed medication as evidenced by patient report attend all scheduled medical appointments  as evidenced by patient report continue to work with RN Care Manager to address care management and care coordination needs related to  Anxiety and Depression, DM, HTN as evidenced by adherence to CM Team Scheduled appointments work with Child psychotherapist to address  related to  the management of Mental Health Concerns  related to the management of Anxiety and Depression as evidenced by review of EMR and patient or social worker report through collaboration with Medical illustrator, provider, and care team.   Interventions: Inter-disciplinary care team collaboration (see longitudinal plan of care) Evaluation of current treatment plan related to  self management and patient's adherence to plan as established by provider   (Status:  New goal.)  Long Term Goal Evaluation of current treatment plan related to Anxiety and Depression, Mental Health Concerns  self-management and patient's adherence to plan as established by provider. Discussed plans with patient for ongoing care management follow up and provided patient with direct contact information for care management team Advised patient to contact provider for any additional DME needs, CBG, and possible general surgeon referral. Reviewed medications with patient Collaborated with LCSW regarding anxiety/depression Reviewed scheduled/upcoming provider appointments Advised patient, providing education and  rationale, to check cbg and record, calling provider  for findings outside established parameters  Social Work referral for anxiety/depression Discussed plans with patient for ongoing care management follow up and provided patient with direct contact information for care management team Assessed social determinant of health barriers Collaborated with Lubertha Sayres Managed Medicaid Liaison regarding DME-motorized wheelchair and lift chair. LCSW appointment rescheduled. Collaborated with Pharmacist. Pharmacy referral for polypharmacy at patient's request-completed  Asthma: (Status:New goal.) Long Term Goal Advised patient to track and manage Asthma triggers Advised patient to engage in light exercise as tolerated 3-5 days a week to aid in the the management of Asthma Provided education about and advised patient to utilize infection prevention strategies to reduce risk of respiratory infection Discussed the importance of adequate rest and management of fatigue with Asthma Assessed social determinant of health barriers   CAD Interventions: (Status:  New goal.) Long Term Goal Assessed understanding of CAD diagnosis Medications reviewed including medications utilized in CAD treatment plan Provided education on importance of blood pressure control in management of CAD Provided education on Importance of limiting foods high in cholesterol Counseled on importance of regular laboratory monitoring as prescribed Counseled on the importance of exercise goals with target of 150 minutes per week Reviewed Importance of taking all medications as prescribed Reviewed Importance of attending all scheduled provider appointments Advised to report any changes in symptoms or exercise tolerance Assessed social determinant of health barriers  Heart Failure Interventions:  (Status:  New goal.) Long Term Goal Basic overview and discussion of pathophysiology of Heart Failure reviewed Provided education on  low sodium diet Assessed need for readable accurate scales in home Provided education about placing scale on hard, flat surface Advised patient to weigh each morning after emptying bladder Discussed importance of daily weight and advised patient to weigh and record daily Discussed the importance of keeping all appointments with provider Provided patient with education about the role of exercise in the management of heart failure Assessed social determinant of health barriers   COPD Interventions:  (Status:  New goal.) Long Term Goal Advised patient to track and manage COPD triggers Advised patient to self assesses COPD action plan zone and make appointment with provider if in the yellow zone for 48 hours without improvement Advised patient to engage in light exercise as tolerated 3-5 days a week to aid in the the management of COPD Provided education about and advised patient to utilize infection prevention strategies to reduce risk of respiratory infection Discussed the importance of adequate rest and management of fatigue with COPD Assessed social determinant of  health barriers  Hyperlipidemia Interventions:  (Status:  New goal.) Long Term Goal Medication review performed; medication list updated in electronic medical record.  Provider established cholesterol goals reviewed Counseled on importance of regular laboratory monitoring as prescribed Reviewed importance of limiting foods high in cholesterol Reviewed exercise goals and target of 150 minutes per week Assessed social determinant of health barriers    Diabetes Interventions:  (Status:  New goal.) Long Term Goal Assessed patient's understanding of A1c goal: <7% Reviewed medications with patient and discussed importance of medication adherence Reviewed scheduled/upcoming provider appointments  Advised patient, providing education and rationale, to check cbg and record, calling provider for findings outside established  parameters Review of patient status, including review of consultants reports, relevant laboratory and other test results, and medications completed Assessed social determinant of health barriers Lab Results  Component Value Date   HGBA1C 7.1 (A) 08/27/2022  HGB A1C  6.6 on 01/08/23 HGB A1C  7.3 on 06/03/23  Hypertension Interventions:  (Status:  New goal.) Long Term Goal Last practice recorded BP readings:  BP Readings from Last 3 Encounters:  09/12/22 133/69  08/27/22 (!) 109/58  07/01/22 137/75  12/25/22          124/60 03/18/23         130/50 06/03/23         127/59  Most recent eGFR/CrCl:  Lab Results  Component Value Date   EGFR 90 06/04/2022    No components found for: "CRCL"  Evaluation of current treatment plan related to hypertension self management and patient's adherence to plan as established by provider Reviewed medications with patient and discussed importance of compliance Discussed plans with patient for ongoing care management follow up and provided patient with direct contact information for care management team Advised patient, providing education and rationale, to monitor blood pressure daily and record, calling PCP for findings outside established parameters Reviewed scheduled/upcoming provider appointments including:  Assessed social determinant of health barriers   Patient Goals/Self-Care Activities: Take all medications as prescribed Attend all scheduled provider appointments Call pharmacy for medication refills 3-7 days in advance of running out of medications Perform all self care activities independently  Perform IADL's (shopping, preparing meals, housekeeping, managing finances) independently Call provider office for new concerns or questions  Work with the social worker to address care coordination needs and will continue to work with the clinical team to address health care and disease management related needs Patient to f/u with provider regarding  sciatica pain-completed  Follow Up Plan:  The patient has been provided with contact information for the care management team and has been advised to call with any health related questions or concerns.  The care management team will reach out to the patient again over the next 60 business  days.    Long-Range Goal: Establish Plan of Care for Chronic Disease Management Needs   Priority: High  Note:   Timeframe:  Long-Range Goal Priority:  High Start Date:      04/14/22                       Expected End Date:     ongoing                  Follow Up Date 07/21/23   - schedule appointment for flu shot - schedule appointment for vaccines needed due to my age or health - schedule recommended health tests (blood work, mammogram, colonoscopy, pap test) - schedule and keep  appointment for annual check-up    Why is this important?   Screening tests can find diseases early when they are easier to treat.  Your doctor or nurse will talk with you about which tests are important for you.  Getting shots for common diseases like the flu and shingles will help prevent them.  06/16/23:  Awaiting colonoscopy referral.  Recent PCP visit 6/5.   Follow Up:  Patient agrees to Care Plan and Follow-up.  Plan: The Managed Medicaid care management team will reach out to the patient again over the next 30 business  days. and The  Patient has been provided with contact information for the Managed Medicaid care management team and has been advised to call with any health related questions or concerns.  Date/time of next scheduled RN care management/care coordination outreach: 07/21/23 at 1030

## 2023-06-18 ENCOUNTER — Ambulatory Visit (INDEPENDENT_AMBULATORY_CARE_PROVIDER_SITE_OTHER): Payer: Medicaid Other | Admitting: Licensed Clinical Social Worker

## 2023-06-18 DIAGNOSIS — F331 Major depressive disorder, recurrent, moderate: Secondary | ICD-10-CM

## 2023-06-18 DIAGNOSIS — F4310 Post-traumatic stress disorder, unspecified: Secondary | ICD-10-CM

## 2023-06-18 DIAGNOSIS — F411 Generalized anxiety disorder: Secondary | ICD-10-CM
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
## 2023-06-21 ENCOUNTER — Other Ambulatory Visit: Payer: Self-pay | Admitting: Student

## 2023-06-21 DIAGNOSIS — E1142 Type 2 diabetes mellitus with diabetic polyneuropathy: Secondary | ICD-10-CM

## 2023-06-29 ENCOUNTER — Other Ambulatory Visit: Payer: Self-pay | Admitting: Student

## 2023-06-29 ENCOUNTER — Other Ambulatory Visit: Payer: Self-pay

## 2023-06-29 DIAGNOSIS — Z419 Encounter for procedure for purposes other than remedying health state, unspecified: Secondary | ICD-10-CM | POA: Diagnosis not present

## 2023-06-29 DIAGNOSIS — E119 Type 2 diabetes mellitus without complications: Secondary | ICD-10-CM

## 2023-06-29 NOTE — Telephone Encounter (Signed)
Annmarie, PA coordinator, received a fax from the pharmacy stating a PA is need for [place]. Is pt on this medication or has it been d/c due to the following note on 6/14.

## 2023-06-30 ENCOUNTER — Other Ambulatory Visit: Payer: Self-pay | Admitting: *Deleted

## 2023-06-30 ENCOUNTER — Other Ambulatory Visit: Payer: Self-pay | Admitting: Student

## 2023-06-30 DIAGNOSIS — E119 Type 2 diabetes mellitus without complications: Secondary | ICD-10-CM

## 2023-06-30 MED ORDER — FREESTYLE LIBRE 3 SENSOR MISC
11 refills | Status: DC
Start: 2023-06-30 — End: 2023-08-18

## 2023-06-30 NOTE — Progress Notes (Deleted)
Call from pharmacy Dr. Justin Mend not on the [facility] list to do prescriptions .  Will need to have new prescription sent in for the [employer].

## 2023-07-01 NOTE — Telephone Encounter (Signed)
Thank you :)

## 2023-07-02 ENCOUNTER — Other Ambulatory Visit: Payer: Medicaid Other | Admitting: Pharmacist

## 2023-07-07 ENCOUNTER — Other Ambulatory Visit: Payer: Medicaid Other | Admitting: Licensed Clinical Social Worker

## 2023-07-07 NOTE — Patient Instructions (Signed)
Visit Information  [name] was given information about Medicaid Managed Care team care coordination services as a part of their [facility] Medicaid benefit. [name] verbally consented to engagement with the [facility] Managed Care team.   If you are experiencing a medical emergency, please call 911 or report to your local emergency department or urgent care.   If you have a non-emergency medical problem during routine business hours, please contact your provider's office and ask to speak with a nurse.   For questions related to your [facility] health plan, please call: [phone] or go here:[email or web address]  If you would like to schedule transportation through your [facility] plan, please call the following number at least 2 days in advance of your appointment: [phone].   You can also use the MTM portal or MTM mobile app to manage your rides. Reimbursement for transportation is available through [facility]! For the portal, please go to [email or web address]  Call the [facility] Crisis Line at [phone], at any time, 24 hours a day, 7 days a week. If you are in danger or need immediate medical attention call 911.  If you would like help to quit smoking, call 1-800-QUIT-NOW (928-474-3132) OR Espaol: 1-855-Djelo-Ya (2-725-366-4403) o para ms informacin haga clic aqu or Text READY to 474-259 to register via text   Following is a copy of your plan of care:  Care Plan : LCSW Plan of Care  Updates made by Gustavus Bryant, LCSW since 07/07/2023 12:00 AM     Problem: Depression Identification (Depression)      Long-Range Goal: Depressive Symptoms Identified   Start Date: 04/17/2022  Recent Progress: On track  Note:   Long-Range Goal: I want to start mental health treatment    Start Date: 04/17/2022  Priority: High  Note:   Priority: High   Timeframe:  Long-Range Goal Priority:  High Start Date:   04/17/22                Expected End  Date:  ongoing                 Follow Up Date--60 day follow up on  - check out counseling - keep 90 percent of counseling appointments - schedule counseling appointment    Why is this important?             Beating depression may take some time.            If you don't feel better right away, don't give up on your treatment plan.     Current barriers:   Chronic Mental Health needs related to depression, stress and anxiety. Patient requires Support, Education, Resources, Referrals, Advocacy, and Care Coordination, in order to meet Unmet Mental Health Needs. Patient will implement clinical interventions discussed today to decrease symptoms of depression and increase knowledge and/or ability of: coping skills. Mental Health Concerns and Social Isolation Patient lacks knowledge of available community counseling agencies and resources.   Clinical Goal(s): verbalize understanding of plan for management of Anxiety, Depression, and Stress and demonstrate a reduction in symptoms. Patient will connect with a provider for ongoing mental health treatment, increase coping skills, healthy habits, self-management skills, and stress reduction        Patient Goals/Self-Care Activities: Over the next 120 days Attend scheduled medical appointments Utilize healthy coping skills and supportive resources discussed Contact PCP with any questions or concerns Keep 90 percent of counseling appointments Call your insurance provider for more information about your Enhanced Benefits  Check out counseling resources provided  Begin personal counseling with LCSW, to reduce and manage symptoms of Depression and Stress, until well-established with mental health provider Accept all calls from representative with [facility] in an effort to establish ongoing mental health counseling and supportive services. Incorporate into daily practice - relaxation techniques, deep breathing exercises, and mindfulness meditation  strategies. Talk about feelings with friends, family members, spiritual advisor, etc. Contact LCSW directly [phone]), if you have questions, need assistance, or if additional social work needs are identified between now and our next scheduled telephone outreach call. Call [phone] for mental health hotline/crisis line if needed (24/7 available) Try techniques to reduce symptoms of anxiety/negative thinking (deep breathing, distraction, positive self talk, etc)  - develop a personal safety plan - develop a plan to deal with triggers like holidays, anniversaries - exercise at least 2 to 3 times per week - have a plan for how to handle bad days - journal feelings and what helps to feel better or worse - spend time or talk with others at least 2 to 3 times per week - watch for early signs of feeling worse - begin personal counseling - call and visit an old friend - check out volunteer opportunities - join a support group - laugh; watch a funny movie or comedian - learn and use visualization or guided imagery - perform a random act of kindness - practice relaxation or meditation daily - start or continue a personal journal - practice positive thinking and self-talk -continue with compliance of taking medication        05/21/2022   12:53 PM 04/17/2022    3:17 PM 11/14/2021    4:02 PM 08/21/2021    4:33 PM 12/10/2020    4:00 PM  Depression screen PHQ 2/9  Decreased Interest 2 2 1  0 1  Down, Depressed, Hopeless 2 2 1  0 0  PHQ - 2 Score 4 4 2  0 1  Altered sleeping 3 3 3   1   Tired, decreased energy 3 3 1   1   Change in appetite 3 3 1   1   Feeling bad or failure about yourself  0 1 0   0  Trouble concentrating 0 0 0   0  Moving slowly or fidgety/restless 0   0   0  Suicidal thoughts 0 0 0   0  PHQ-9 Score 13 14 7   4   Difficult doing work/chores Somewhat difficult Somewhat difficult Not difficult at all   Not difficult at all        24- Hour Availability:    [facility]  [address] [place], [place] [phone] Crisis [phone]   [facility] [phone]   [place] [facility]  [phone]    [facility]  [phone] (after hours)   Therapeutic Alternative/Mobile Crisis   [phone]   [place] National Suicide Hotline  [phone] (TALK) OR 988   Call 911 or go to emergency room   [facility]  [phone]);  [place] and [employer]  [phone]); [place], [place], [place], [place], [place], [place], [place]

## 2023-07-07 NOTE — Patient Outreach (Signed)
Medicaid Managed Care Social Work Note  07/07/2023 Name:  [name] MRN:  [id number] DOB:  28-Oct-1961  [name] is an 62 y.o. year old female who is a primary patient of [name] Doheny, [name].  The Medicaid Managed Care Coordination team was consulted for assistance with:  Mental Health Counseling and Resources  [name] was given information about Medicaid Managed Care Coordination team services today. [name] Patient agreed to services and verbal consent obtained.  Engaged with patient  for by telephone forfollow up visit in response to referral for case management and/or care coordination services.   Assessments/Interventions:  Review of past medical history, allergies, medications, health status, including review of consultants reports, laboratory and other test data, was performed as part of comprehensive evaluation and provision of chronic care management services.  SDOH: (Social Determinant of Health) assessments and interventions performed: SDOH Interventions    Flowsheet Row Patient Outreach Telephone from 07/07/2023 in [place] [facility]PARTMENT Patient Outreach Telephone from 06/16/2023 in [place] POPULATION HEALTH DEPARTMENT Office Visit from 06/03/2023 in [facility] Patient Outreach Telephone from 05/12/2023 in [place] POPULATION HEALTH DEPARTMENT Patient Outreach Telephone from 05/04/2023 in [place] POPULATION HEALTH DEPARTMENT Patient Outreach Telephone from 04/01/2023 in [place] POPULATION HEALTH DEPARTMENT  SDOH Interventions        Food Insecurity Interventions -- Intervention Not Indicated -- -- -- --  Housing Interventions -- Intervention Not Indicated -- -- -- --  Alcohol Usage Interventions -- -- -- -- -- Intervention Not Indicated (Score <7)  Depression Interventions/Treatment  -- -- Currently on Treatment -- -- --  Financial Strain Interventions -- -- -- Intervention Not Indicated --  --  Stress Interventions Provide Counseling, Advertising copywriter ended therapy per her choice yesterday] -- -- -- [employer], Provide Counseling --       Advanced Directives Status:  Not addressed in this encounter.  Care Plan                 No Known Allergies  Medications Reviewed Today     Reviewed by [name] (Registered Nurse) on 06/16/23 at 1044  Med List Status: <None>   Medication Order Taking? Sig Documenting Provider Last Dose Status Informant  Accu-Chek FastClix Lancets MISC [id number]  Check blood sugar two times a day [name] Courser, [name]  Active Self  acetaminophen (TYLENOL) 500 MG tablet [id number]  Take 1,000 mg by mouth every 6 (six) hours as needed for moderate pain. Provider, Historical, [name]  Active Self  albuterol (PROVENTIL) (2.5 MG/3ML) 0.083% nebulizer solution [id number]  USE 1 VIAL IN NEBULIZER EVERY 4 HOURS AS NEEDED FOR WHEEZING OR SHORTNESS OF BREATH [name]  Active Self  amLODipine (NORVASC) 5 MG tablet [id number]  Take 1 tablet (5 mg total) by mouth daily. [name] Kanner, [name]  Active   ASPIRIN ADULT LOW STRENGTH 81 MG EC tablet [id number]  TAKE 1 Tablet BY MOUTH ONCE DAILY [name] Courser, [name]  Active Self  atorvastatin (LIPITOR) 80 MG tablet [id number]  Take 1 tablet (80 mg total) by mouth daily. [name] Kanner, [name]  Active   B-D UF III MINI PEN NEEDLES 31G X 5 MM MISC [id number]  USE AS DIRECTED DAILY [name]  Active Self  Blood Glucose Monitoring Suppl (ACCU-CHEK GUIDE) w/Device KIT [id number]  1 each by Does not apply route 2 (two) times daily. [name] Courser, [name]  Active Self  busPIRone (BUSPAR) 15 MG tablet [id number]  Take 1 tablet (15 mg total) by mouth 2 (two) times daily. [name] Cisco, [name]  Active   cholecalciferol (VITAMIN D3) 25 MCG (1000 UNIT) tablet [id number]  Take 1,000 Units by mouth daily. Provider, Historical, [name]  Active Self  clopidogrel (PLAVIX) 75 MG  tablet [id number]  TAKE 1 TABLET BY MOUTH IN THE [name]  Active   Continuous Blood Gluc Sensor (FREESTYLE LIBRE 3 SENSOR) [place] [id number]  Place 1 sensor on the skin every 14 days. Use to check glucose continuously [name] Kanner, [name]  Active Self  dapagliflozin propanediol (FARXIGA) 5 MG TABS tablet [id number] No Take 1 tablet (5 mg total) by mouth daily before breakfast.  Patient not taking: Reported on 06/16/2023   [name] Not Taking Active   DULoxetine (CYMBALTA) 20 MG capsule [id number]  Take 1 capsule (20 mg total) by mouth daily. [name] Cisco, [name]  Active   Evolocumab (REPATHA SURECLICK) 140 MG/ML Ivory Broad [id number]  INJECT 1 PEN INTO THE SKIN EVERY 14 DAYS [name]  Active   exemestane (AROMASIN) 25 MG tablet [id number]  TAKE 1 TABLET BY MOUTH ONCE DAILY AFTER Philippa Sicks, [name]  Active Self  ezetimibe (ZETIA) 10 MG tablet [id number]  Take 1 tablet by mouth once daily [name], [name] George, [name]  Active   ferrous sulfate 325 (65 FE) MG tablet [id number]  Take 1 tablet (325 mg total) by mouth every other day.  Patient not taking: Reported on 05/21/2023   [name] Puma, [name]  Active Self    Discontinued 05/09/20 1429   Fluticasone-Umeclidin-Vilant (TRELEGY ELLIPTA) 100-62.5-25 MCG/ACT AEPB [id number]  INHALE 1 PUFF ONCE DAILY [name] Kanner, [name]  Active   furosemide (LASIX) 40 MG tablet [id number]  Take 1 tablet (40 mg total) by mouth as needed for fluid or edema.  Patient not taking: Reported on 03/19/2023   Chauncey Mann, DO  Active Self  gabapentin (NEURONTIN) 300 MG capsule [id number]  TAKE 2 CAPSULES BY MOUTH IN THE MORNING AND 3 CAPSULES AT BEDTIME [name] Kanner, [name]  Active            Med Note Clearance [name], [name]   Thu May 21, 2023  1:10 PM) Taking 1 QAM and 3 QPM  glucose blood (ACCU-CHEK GUIDE) test strip [id number]  Check blood sugar 2 times per day [name] Kanner, [name]  Active Self  hydrocortisone cream 1 % [id number]   Apply 1 Application topically daily as needed for itching. Provider, Historical, [name]  Active Self  losartan-hydrochlorothiazide (HYZAAR) 100-25 MG tablet [id number]  Take 1 tablet by mouth once daily [name]  Active   metFORMIN (GLUCOPHAGE-XR) 500 MG 24 hr tablet [id number]  Take 1 tablet by mouth twice daily [name] Kanner, [name]  Active   metoprolol succinate (TOPROL-XL) 50 MG 24 hr tablet [id number]  TAKE 1 TABLET BY MOUTH ONCE DAILY WITH MEALS OR  IMMEDIATELY  FOLLOWING  A  MEAL [name] Kanner, [name]  Active Self  montelukast (SINGULAIR) 10 MG tablet [id number]  Take 1 tablet (10 mg total) by mouth at bedtime. [name] Kanner, [name]  Active Self  Semaglutide, 1 MG/DOSE, 4 MG/3ML SOPN [id number]  Inject 1 mg into the skin once a week. [name]  Active   senna-docusate (SENNA-TIME S) 8.6-50 MG tablet [id number]  Take 1 tablet by mouth at bedtime as needed for mild constipation.  Patient not taking: Reported on 05/21/2023   [name] Puma, [name]  Active  Self  traZODone (DESYREL) 50 MG tablet [id number]  Take 1-2 tablets (50-100 mg total) by mouth at bedtime. [name] Cisco, [name]  Active   VENTOLIN HFA 108 (90 Base) MCG/ACT inhaler [id number]  INHALE 2 PUFFS BY MOUTH EVERY 6 HOURS AS NEEDED FOR WHEEZING FOR SHORTNESS OF Alben Spittle, [name]  Active Self            Patient Active Problem List   Diagnosis Date Noted   Colon cancer screening 06/03/2023   Abdominal hernia 03/19/2023   History of UTI 12/05/2022   COPD exacerbation (HCC) 11/16/2022   Diabetic neuropathy (HCC) 08/29/2022   Vaginal lesion 08/29/2022   PTSD (post-traumatic stress disorder) 06/24/2022   GAD (generalized anxiety disorder) 06/24/2022   Major depressive disorder, recurrent episode, moderate (HCC) 06/24/2022   Urinary incontinence 06/20/2022   Intertrigo 06/20/2022   Arthritis of left shoulder region    S/P reverse total shoulder arthroplasty, 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   Healthcare maintenance 11/17/2021    Spondylolisthesis, lumbar region 02/11/2021    Class: Acute   Malignant neoplasm of upper-outer quadrant of right breast in female, estrogen receptor positive (HCC) 06/10/2018   Restless leg syndrome 02/04/2018   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   Depression 08/06/2015   Hypertension associated with diabetes (HCC)    Aortic stenosis s/p TAVR    Chronic diastolic congestive heart failure (HCC)    COPD (chronic obstructive pulmonary disease) (HCC) 09/21/2013   Obstructive sleep apnea 09/21/2013   Nephrolithiasis 09/21/2013   Nocturnal hypoxemia 09/12/2013   Hyperlipidemia 11/30/2012   History of pulmonary embolus (PE) 07/09/2012   Normocytic anemia 02/08/2012   Type 2 diabetes mellitus without complication, without long-term current use of insulin (HCC) 02/08/2012   Asthma 02/08/2012   GERD (gastroesophageal reflux disease)    CAD (coronary artery disease) 12/08/2011    Conditions to be addressed/monitored per PCP order:  Depression  Care Plan : LCSW Plan of Care  Updates made by Gustavus Bryant, LCSW since 07/07/2023 12:00 AM     Problem: Depression Identification (Depression)      Long-Range Goal: Depressive Symptoms Identified   Start Date: 04/17/2022  Recent Progress: On track  Note:   Long-Range Goal: I want to start mental health treatment    Start Date: 04/17/2022  Priority: High  Note:   Priority: High   Timeframe:  Long-Range Goal Priority:  High Start Date:   04/17/22                Expected End Date:  ongoing                 Follow Up Date--60 day follow up on 09/07/23 at 10:30 am  - check out counseling - keep 90 percent of counseling appointments - schedule counseling appointment    Why is this important?             Beating depression may take some time.            If you don'[name] feel better right away, don'[name] give up on your treatment plan.     Current barriers:   Chronic Mental Health needs related to depression, stress and anxiety.  Patient requires Support, Education, Resources, Referrals, Advocacy, and Care Coordination, in order to meet Unmet Mental Health Needs. Patient will implement clinical interventions discussed today to decrease symptoms of depression and increase knowledge and/or ability of: coping skills. Mental Health Concerns and Social Isolation Patient lacks knowledge of available community counseling agencies and resources.  Clinical Goal(s): verbalize understanding of plan for management of Anxiety, Depression, and Stress and demonstrate a reduction in symptoms. Patient will connect with a provider for ongoing mental health treatment, increase coping skills, healthy habits, self-management skills, and stress reduction        Clinical Interventions:  Assessed patient's previous and current treatment, coping skills, support system and barriers to care. Patient provided history. Patient and spouse lived in a shared home with two other adults. Patient reports that this is a current and stable living situation.  Verbalization of feelings encouraged, motivational interviewing employed Emotional support provided, positive coping strategies explored Self care/establishing healthy boundaries emphasized Patient reports that she takes medication for anxiety and depression. She reports that this has alleviated some of symptoms but not all. She reports that she still has crying spells. She needs help with finding psychiatry and counseling. She was successful in identifying triggers to anxiety and depression symptoms, in addition, to healthy coping skills.  Patient reports significant worsening anxiety and depression impacting her ability to function appropriately and carry out daily task. Patient receives strong support from spouse but he does not have anxiety or depression and is unable to relate to her and her daily mental health struggles.  Patient is agreeable to referral to [facility] for counseling and psychiatry. [facility] LCSW  made referral on 04/17/22. Patient will call [facility] tomorrow on 04/18/22 to schedule counseling and psychiatry appointments. Email sent to her with [facility]'s contact information.  LCSW provided education on relaxation techniques such as meditation, deep breathing, massage, grounding exercises or yoga that can activate the body's relaxation response and ease symptoms of stress and anxiety. LCSW ask that when pt is struggling with difficult emotions and racing thoughts that they start this relaxation response process. LCSW provided extensive education on healthy coping skills for anxiety. SW used active and reflective listening, validated patient's feelings/concerns, and provided emotional support. Patient will work on implementing appropriate self-care habits into their daily routine such as: staying positive, writing a gratitude list, drinking water, staying active around the house, taking their medications as prescribed, combating negative thoughts or emotions and staying connected with their family and friends. Positive reinforcement provided for this decision to work on this. Patient was receptive to anxiety and depression management coping skill education. She will try to increase her socialization over the next 90 days to decrease isolation.  Patient has problems with sleep disturbance. LCSW provided education on healthy sleep hygiene and what that looks like. LCSW encouraged patient to implement a night time routine into her schedule that works best for her and that she is able to maintain. Advised patient to implement deep breathing/grounding/meditation/self-care exercises into her nightly routine to combat racing thoughts at night. LCSW encouraged patient to wake up at the same time each day, make their sleeping environment comfortable, exercise when able, to limit naps and to not eat or drink anything right before bed.   Motivational Interviewing employed Depression screen reviewed  PHQ2/ PHQ9  completed Mindfulness or Relaxation training provided Active listening / Reflection utilized  Advance Care and HCPOA education provided Emotional Support Provided Problem Solving /Task Center strategies reviewed Provided psychoeducation for mental health needs  Provided brief CBT  Reviewed mental health medications and discussed importance of compliance:  Quality of sleep assessed & Sleep Hygiene techniques promoted  Participation in counseling encouraged  Verbalization of feelings encouraged  Suicidal Ideation/Homicidal Ideation assessed: Patient denies SI/HI  Review resources, discussed options and provided patient information about  Mental Health Resources  Inter-disciplinary care team collaboration (see longitudinal plan of care) UPDATE 05/21/22- Patient was successfully set up with counseling at [facility] but was not set up with psychiatry. She is in need of both services. Patient has an upcoming counseling session at [facility] on 06/24/22. [facility] LCSW completed joint call with patient to [facility] but was unable to reach anyone. [facility] LCSW left a voice message asking for them to return call to patient to get her scheduled with psychiatry as [facility] LCSW ask for both psychiatry and counseling in referral. [facility] LCSW ask if they needed an additional referral to please let [facility] LCSW know so this can be completed. [facility] LCSW sent patient an email with [facility]'s contact information including their address as well as a list of coping skills for depression and anxiety. Patient's spouse has multiple health conditions and tells patient that he "wishes to die" often which concerns her. [facility] LCSW provided emotional support and brief coping skill education on stress management.  UPDATE 06/12/22- Patient successfully contacted [facility] and set up appointments for both psychiatry and counseling. Patient is eager to start treatment. Brief self-care education provided to patient. Patient is agreeable to contact [facility] LCSW directly if needed. [facility]  LCSW will make one last follow up on call to ensure that patient was successfully connected to a long term mental health provider. Patient denies any current crises or concerns.  UPDATE 07/18/22- Patient has not been set up with counseling at [facility] and she has completed her first counseling session but she has not been set up with psychiatry still. She has made several calls to agency but has been unsuccessful in reaching them. Patient has an in person appointment at [facility] on 07/30/22 and she will ask them to enroll her in psychiatry at that time. [facility] LCSW will follow up and continue to support patient as needed. UPDATE 08/01/22- Patient reports that she is doing extremely well and that her mood has lifted over the last few weeks. She reports that her first counselor appointment well very well and she was able to build rapport with him. She will see her counselor 2X per month. Her next appointment for therapy is on 08/20/22. Patient has her initial appointment with psychiatry is on the 08/12/22. UPDATE- Patient successfully completed her psychiatry appointment and has a follow up scheduled in two months. However, patient reports having ongoing issues with sleep even with the medication and reports needing this medication to be adjusted. Pt will discuss this concern with her therapist on 09/09/22 but was encouraged to call [facility] before then if needed to address this issue. [facility] LCSW will follow up in two weeks. UPDATE- Patient expressed to her counselor about her ongoing issues with her sleep and he informed her to tell her psychiatrist. [facility] LCSW sent message to her psychiatrist with this medication dosage adjustment request. [facility] LCSW educated patient on healthy sleep hygiene. Patient denies any current crises. Patient is agreeable to contact [facility] herself to inquire about her ongoing sleep issues. UPDATE 10/17- Patient continues to struggle with ongoing sleep issues and has decided not to call her psychiatrist but wait  until her scheduled appointment on 11/06/22 to address these concerns. [facility] LCSW suggested against this as patient will not been seen in over 4 weeks but patient reports that she can manage until then. Healthy sleep and hygiene techniques were provided to patient. Patient will continue to try as much healthy self-care into her daily routine to combat these negative symptoms. [facility] LCSW will follow up the  day after her psychiatry appointment. UPDATE- Patient met with counselor and psychiatrist yesterday. She reports that there was an adjustment in her medicine in order to improve her sleep. Patient reports that her sleep has somewhat improved. She reports that she was able to sign up successfully for Social Security. 01/09/23- Patient contacted [facility] Management department and left a message requesting a return call from [facility] LCSW. [facility] LCSW completed call and was informed that patient had called [facility] yesterday and was on hold for a hour. She states that she is really needing to get a new medicine for sleep. The nurses in the back said that she would need to arrange a telephone call for Dr. Lona Kettle to reach out to assist with this bc she hasn'[name] seen Dr. Lona Kettle in 3 months and will not until next month. Patient as left on hold and eventually hung up. In basket message was sent to staff and psychiatrist at [facility] and these messages were read and hopefully appointment will be scheduled. 01/19/23 update- Pt spoke with her psychiatrist and was able to adjust her medications to see if it could improve her sleep routine. Patient reports that she had a very emotional session with her counselor today. Patient continues to make great strides towards improving her self-worth. Positive reinforcement provided, 02/09/23 update- Patient reports being sick right now. She reports that her depression increases whenever she is sick and she was given extensive self-care education pertaining to recovery. Patient reports that her sleep as improved  with the newly adjusted medication that her psychiatrist prescribed. 03/12/23 LCSW update- Ongoing self-care education provided. Patient reports issues with her Wellbutrin. She shares that she will call her psychiatrist today for advice as this medication is now making her dizzy. Email sent to patient with coping skills and reminders. 05/04/23- Patient reports that she had a successful video video with her psychiatrist and has decided that she will slow down her sessions with her counselor due to her increased progress with new coping skills and new medication. [facility] LCSW will do a 90 day follow up to ensure follow up on any further social work needs. Update- Patient reports that she is doing very well since taking her medications as prescribed. She shares that she decided to have her last therapy session yesterday with Madelaine Bhat at [facility] and will contact him in the future if she needs to start back counseling. Patient has a psychiatrist appointment tomorrow. She is agreeable to one final follow up call by [facility] LCSW in 60 days. [facility] LCSW provided emotional support and self-care education on healthy sleeping habits. Patient continues to take her trazodone at night.   Patient Goals/Self-Care Activities: Over the next 120 days Attend scheduled medical appointments Utilize healthy coping skills and supportive resources discussed Contact PCP with any questions or concerns Keep 90 percent of counseling appointments Call your insurance provider for more information about your Enhanced Benefits  Check out counseling resources provided  Begin personal counseling with LCSW, to reduce and manage symptoms of Depression and Stress, until well-established with mental health provider Accept all calls from representative with [facility] in an effort to establish ongoing mental health counseling and supportive services. Incorporate into daily practice - relaxation techniques, deep breathing exercises, and mindfulness meditation  strategies. Talk about feelings with friends, family members, spiritual advisor, etc. Contact LCSW directly [phone]), if you have questions, need assistance, or if additional social work needs are identified between now and our next scheduled telephone outreach call. Call [phone] for mental health hotline/crisis  line if needed (24/7 available) Try techniques to reduce symptoms of anxiety/negative thinking (deep breathing, distraction, positive self talk, etc)  - develop a personal safety plan - develop a plan to deal with triggers like holidays, anniversaries - exercise at least 2 to 3 times per week - have a plan for how to handle bad days - journal feelings and what helps to feel better or worse - spend time or talk with others at least 2 to 3 times per week - watch for early signs of feeling worse - begin personal counseling - call and visit an old friend - check out volunteer opportunities - join a support group - laugh; watch a funny movie or comedian - learn and use visualization or guided imagery - perform a random act of kindness - practice relaxation or meditation daily - start or continue a personal journal - practice positive thinking and self-talk -continue with compliance of taking medication        05/21/2022   12:53 PM 04/17/2022    3:17 PM 11/14/2021    4:02 PM 08/21/2021    4:33 PM 12/10/2020    4:00 PM  Depression screen PHQ 2/9  Decreased Interest 2 2 1  0 1  Down, Depressed, Hopeless 2 2 1  0 0  PHQ - 2 Score 4 4 2  0 1  Altered sleeping 3 3 3   1   Tired, decreased energy 3 3 1   1   Change in appetite 3 3 1   1   Feeling bad or failure about yourself  0 1 0   0  Trouble concentrating 0 0 0   0  Moving slowly or fidgety/restless 0   0   0  Suicidal thoughts 0 0 0   0  PHQ-9 Score 13 14 7   4   Difficult doing work/chores Somewhat difficult Somewhat difficult Not difficult at all   Not difficult at all        24- Hour Availability:    [facility]  [address] [place], [place] [phone] Crisis [phone]   [facility] [phone]   [place] [facility]  [phone]    [facility]  [phone] (after hours)   Therapeutic Alternative/Mobile Crisis   [phone]   [place] National Suicide Hotline  [phone] (TALK) OR 988   Call 911 or go to emergency room   [facility]  [phone]);  [place] and [employer]  [phone]); [place], [place], [place], [place], [place], [place], [place]           Follow up:  Patient agrees to Care Plan and Follow-up.  Plan: The Managed Medicaid care management team will reach out to the patient again over the next 60 days.  Dickie La, BSW, MSW, [employer] Managed Medicaid LCSW [facility]  [facility] [email or web address] Phone: [phone]

## 2023-07-08 ENCOUNTER — Encounter (HOSPITAL_COMMUNITY): Payer: Self-pay | Admitting: Psychiatry

## 2023-07-08 ENCOUNTER — Telehealth (INDEPENDENT_AMBULATORY_CARE_PROVIDER_SITE_OTHER): Payer: Medicaid Other | Admitting: Psychiatry

## 2023-07-08 DIAGNOSIS — E1142 Type 2 diabetes mellitus with diabetic polyneuropathy: Secondary | ICD-10-CM

## 2023-07-08 DIAGNOSIS — F411 Generalized anxiety disorder: Secondary | ICD-10-CM | POA: Diagnosis not present

## 2023-07-08 DIAGNOSIS — F4310 Post-traumatic stress disorder, unspecified: Secondary | ICD-10-CM

## 2023-07-08 DIAGNOSIS — F32A Depression, unspecified: Secondary | ICD-10-CM

## 2023-07-08 MED ORDER — TRAZODONE HCL 50 MG PO TABS
50.0000 mg | ORAL_TABLET | Freq: Every day | ORAL | 3 refills | Status: DC
Start: 2023-07-08 — End: 2023-09-22

## 2023-07-08 MED ORDER — BUSPIRONE HCL 15 MG PO TABS: 15.0000 mg | ORAL_TABLET | Freq: Two times a day (BID) | ORAL | 3 refills | Status: DC

## 2023-07-08 MED ORDER — DULOXETINE HCL 20 MG PO CPEP: 20.0000 mg | ORAL_CAPSULE | Freq: Every day | ORAL | 3 refills | Status: DC

## 2023-07-08 NOTE — Progress Notes (Signed)
BH MD/PA/NP OP Progress Note Virtual Visit via Video Note  I connected with [name] on 07/08/23 at  2:00 PM EDT by a video enabled telemedicine application and verified that I am speaking with the correct person using two identifiers.  Location: Patient: zoo Provider: Clinic   I discussed the limitations of evaluation and management by telemedicine and the availability of in person appointments. The patient expressed understanding and agreed to proceed.  I provided 30 minutes of non-face-to-face time during this encounter.   07/08/2023 2:11 PM [name]  MRN:  [id number]  Chief Complaint: "The medications are working well"  HPI:  62 year old female seen today for follow-up psychiatric evaluation.  She has a psychiatric history of anxiety, depression, and PTSD.  Currently she is being managed on BuSpar 15 mg 3 times daily (noting that she takes it twice daily), Cymbalta 20 mg daily, and trazodone 50 to 100 mg nightly as needed.  She notes the medications are effective in managing her psychiatric condition.   Today she was well-groomed, pleasant, cooperative, and engaged in conversation.  She informed Clinical research associate that she feels that the medications are working well. She notes that she has not needed to see her therapist and is able to cope. She notes that she has not had any recent traumas and is grateful. She continue to feels mentally stable and reports that her anxiety and depression are well managed. Today provider conducted GAD-7 and patient scored a 3, at her last visit she scored a 4.  Provider also conducted PHQ-9 the patient scored a 1, at her last visit she scored a 0.  She endorses adequate sleep and appetite.  Today she denies SI/HI/AVH, mania, paranoia.    Patient reports that she is on Ozempic but is uncertain if she has lost weight. She continues to have back and leg pains due to sciatica.  She reports gabapentin is somewhat effective in managing her pain but  informed writer that she has been becoming more forgetful.  Provider informed patient that gabapentin could cause forgetfulness and recommend that she talk to her primary care doctor about adjustments.  She endorsed understanding and agreed.  No medication changes made today.  Patient agreeable to continue medication as prescribed.  No other concerns noted at this time.     Visit Diagnosis:  No diagnosis found.   Past Psychiatric History: anxiety, depression, and PTSD  Past Medical History:  Past Medical History:  Diagnosis Date   Anemia    Anxiety    Aortic valve stenosis, severe    Arthritis    PAIN AND SEVERE OA LEFT KNEE ; S/P RIGHT TKA ON 02/03/12; HAS LOWER BACK PAIN-UNABLE TO STAND MORE THAN 10 MIN; ARTHRITIS "ALL OVER"   Asthma    Blood transfusion    2013[facility]   Breast cancer in female [facility])    Right   CAD (coronary artery disease)    Cath 2010 with DES x 1 RCA-- PT'S CARDIOLOGIST IS DR. MCALHANY   Chronic diastolic congestive heart failure (HCC)    COPD (chronic obstructive pulmonary disease) (HCC)    Depression    Diabetes mellitus DIAGNOSED IN2010   Dyspnea    with much ambulation   Eczema    on back   Headache    migraines younger- rare now 02/07/21   Heart murmur    no current problems   History of hiatal hernia    History of kidney stones    passed or blasted  Hyperlipidemia    Hypertension    Morbid obesity with body mass index of 60.0-69.9 in adult [facility])    Myocardial infarction (HCC)    PT THINKS SHE WAS DX WITH MI AT THE TIME OF HEART STENTING   Neuromuscular disorder (HCC)    bilateral arm/hands   Oxygen dependent    uses 3L oxygen night/prn   Personal history of radiation therapy    Pneumonia    Pulmonary embolism (HCC) 02/08/2012   S/P RT TOTAL KNEE ON 02/03/12--ON 02/08/12--DEVELOPED ACUTE SOB AND CHEST PAIN--AND DIAGNOSED WITH  PULMONARY EMBOLUS AND PNEUMONIA   Restless leg syndrome    Sleep apnea    uses 3 liters O2 at night as needed    Uterine fibroid    NO PROBLEMS AT PRESENT FROM THE FIBROIDS-STATES SHE IS POST MENOPAUSAL-LAST MENSES 2010 EXCEPT FOR EPISODE THIS YR OF BLEEDING RELATED TO FIBROIDS.   Weakness    BOTH HANDS - S/P BILATERAL CARPAL TUNNEL RELEASE--BUT STILL HAS WEAKNESS--OFTEN DROPS THINGS    Past Surgical History:  Procedure Laterality Date   BACK SURGERY  02/11/2021   Dr Otelia Sergeant   BREAST BIOPSY Right 06/04/2018   BREAST LUMPECTOMY Right 06/2018   BREAST LUMPECTOMY WITH RADIOACTIVE SEED AND SENTINEL LYMPH NODE BIOPSY Right 07/19/2018   Procedure: BREAST LUMPECTOMY WITH RADIOACTIVE SEED AND SENTINEL LYMPH NODE BIOPSY;  Surgeon: Ovidio Kin, MD;  [facility];  Service: General;  Laterality: Right;   CARDIAC CATHETERIZATION     CARDIAC VALVE REPLACEMENT     2017   CARPAL TUNNEL RELEASE     Bilateral   CHOLECYSTECTOMY     COLONOSCOPY     CORONARY ANGIOPLASTY     2010 has stent in place   CYSTOSCOPY W/ RETROGRADES Right 09/21/2013   Procedure: CYSTOSCOPY WITH RIGHT RETROGRADE PYELOGRAM RIGHT DOUBLE J STENT ;  Surgeon: Antony Haste, MD;  Location: [facility] ORS;  Service: Urology;  Laterality: Right;   CYSTOSCOPY W/ URETERAL STENT PLACEMENT Right 11/16/2022   Procedure: CYSTOSCOPY WITH RETROGRADE PYELOGRAM/URETERAL STENT PLACEMENT;  Surgeon: Despina Arias, MD;  [facility] OR;  Service: Urology;  Laterality: Right;   CYSTOSCOPY WITH RETROGRADE PYELOGRAM, URETEROSCOPY AND STENT PLACEMENT Right 01/13/2023   Procedure: CYSTOSCOPY WITH RETROGRADE PYELOGRAM, URETEROSCOPY AND STENT EXCHANGE;  Surgeon: Milderd Meager., MD;  [facility]: [facility];  Service: Urology;  Laterality: Right;   CYSTOSCOPY WITH URETEROSCOPY AND STENT PLACEMENT Right 10/25/2013   Procedure: CYSTOSCOPY RIGHT URETEROSCOPY HOLMIUM LASER LITHO AND STENT PLACEMENT;  Surgeon: Antony Haste, MD;  [facility]: [facility];  Service: Urology;  Laterality: Right;   HERNIA REPAIR  1995   umbilical ,   HOLMIUM LASER APPLICATION Right  01/13/2023   Procedure: HOLMIUM LASER APPLICATION;  Surgeon: Milderd Meager., MD;  [facility]: [facility];  Service: Urology;  Laterality: Right;   INTRAOPERATIVE TRANSESOPHAGEAL ECHOCARDIOGRAM N/A 12/12/2014   Procedure: INTRAOPERATIVE TRANSESOPHAGEAL ECHOCARDIOGRAM;  Surgeon: Kathleene Hazel, MD;  Location[facility] OR;  Service: Cardiovascular;  Laterality: N/A;   JOINT REPLACEMENT     bil total knees   KNEE ARTHROPLASTY  02/03/2012   Procedure: COMPUTER ASSISTED TOTAL KNEE ARTHROPLASTY;  Surgeon: [name] Hitch, MD;  [facility];  Service: Orthopedics;  Laterality: Right;  Right total knee arthroplasty   LEFT AND RIGHT HEART CATHETERIZATION WITH CORONARY ANGIOGRAM N/A 03/17/2013   Procedure: LEFT AND RIGHT HEART CATHETERIZATION WITH CORONARY ANGIOGRAM;  Surgeon: Kathleene Hazel, MD;  Location: [facility] CATH LAB;  Service: Cardiovascular;  Laterality: N/A;   LEFT  AND RIGHT HEART CATHETERIZATION WITH CORONARY/GRAFT ANGIOGRAM N/A 09/14/2014   Procedure: LEFT AND RIGHT HEART CATHETERIZATION WITH Isabel Caprice;  Surgeon: Kathleene Hazel, MD;  Location: [facility] CATH LAB;  Service: Cardiovascular;  Laterality: N/A;   REVERSE SHOULDER ARTHROPLASTY Left 03/06/2022   Procedure: LEFT SHOULDER REPLACEMENT APPLICATION OF WOUND VAC;  Surgeon: Cammy Copa, MD;  [facility];  Service: Orthopedics;  Laterality: Left;   TEE WITHOUT CARDIOVERSION N/A 03/14/2013   Procedure: TRANSESOPHAGEAL ECHOCARDIOGRAM (TEE);  Surgeon: Lewayne Bunting, MD;  Location: [facility] ENDOSCOPY;  Service: Cardiovascular;  Laterality: N/A;   TEE WITHOUT CARDIOVERSION N/A 11/14/2014   Procedure: TRANSESOPHAGEAL ECHOCARDIOGRAM (TEE);  Surgeon: Lewayne Bunting, MD;  Location[facility] ENDOSCOPY;  Service: Cardiovascular;  Laterality: N/A;   TONSILLECTOMY     maybe as a child- does not know   TOTAL KNEE ARTHROPLASTY  09/10/2012   Procedure: TOTAL KNEE ARTHROPLASTY;  Surgeon: [name] Hitch, MD;   [facility];  Service: Orthopedics;  Laterality: Left;   TOTAL KNEE REVISION Right 07/15/2013   Procedure: REVISION ARTHROPLASTY RIGHT KNEE;  Surgeon: [name] Hitch, MD;  [facility];  Service: Orthopedics;  Laterality: Right;   TRANSCATHETER AORTIC VALVE REPLACEMENT, TRANSFEMORAL N/A 12/12/2014   Procedure: TRANSCATHETER AORTIC VALVE REPLACEMENT, TRANSFEMORAL;  Surgeon: Kathleene Hazel, MD;  [facility];  Service: Cardiovascular;  Laterality: N/A;   TRIGGER FINGER RELEASE  09/10/2012   Procedure: RELEASE TRIGGER FINGER/A-1 PULLEY;  Surgeon: [name] Hitch, MD;  [facility];  Service: Orthopedics;  Laterality: Right;  Right Ring Finger   TUBAL LIGATION      Family Psychiatric History: Mother alcohol use and brother intellectual delay   Family History:  Family History  Problem Relation Age of Onset   Breast cancer Mother        stage IV at diagnosis   Emphysema Mother        smoked   Heart disease Mother    COPD Father        smoked   Asthma Father    Heart disease Father    Cancer Brother        Sinus    Social History:  Social History   Socioeconomic History   Marital status: Married    Spouse name: Not on file   Number of children: 2   Years of education: Not on file   Highest education level: Not on file  Occupational History   Occupation: Disabled  Tobacco Use   Smoking status: Former    Packs/day: 1.50    Years: 30.00    Additional pack years: 0.00    Total pack years: 45.00    Types: Cigarettes    Quit date: 12/29/2000    Years since quitting: 22.5   Smokeless tobacco: Never  Vaping Use   Vaping Use: Never used  Substance and Sexual Activity   Alcohol use: Not Currently   Drug use: Not Currently    Types: Marijuana    Comment: CBD oils through vape shops   Sexual activity: Not Currently    Birth control/protection: Surgical, Post-menopausal    Comment: tubal ligation  Other Topics Concern   Not on file  Social  History Narrative   Not on file   Social Determinants of Health   Financial Resource Strain: Low Risk  (05/12/2023)   Overall Financial Resource Strain (CARDIA)    Difficulty of Paying Living Expenses: Not very hard  Food Insecurity: No Food Insecurity (06/16/2023)   Hunger  Vital Sign    Worried About Programme researcher, broadcasting/film/video in the Last Year: Never true    Ran Out of Food in the Last Year: Never true  Transportation Needs: No Transportation Needs (03/09/2023)   PRAPARE - Administrator, Civil Service (Medical): No    Lack of Transportation (Non-Medical): No  Physical Activity: Inactive (01/09/2023)   Exercise Vital Sign    Days of Exercise per Week: 0 days    Minutes of Exercise per Session: 0 min  Stress: No Stress Concern Present (07/07/2023)   [employer] of Occupational Health - Occupational Stress Questionnaire    Feeling of Stress : Only a little  Recent Concern: Stress - Stress Concern Present (05/05/2023)   [employer] of Occupational Health - Occupational Stress Questionnaire    Feeling of Stress : To some extent  Social Connections: Moderately Isolated (05/12/2023)   Social Connection and Isolation Panel [NHANES]    Frequency of Communication with Friends and Family: More than three times a week    Frequency of Social Gatherings with Friends and Family: More than three times a week    Attends Religious Services: Never    Database administrator or Organizations: No    Attends Engineer, structural: Never    Marital Status: Married    Allergies: No Known Allergies  Metabolic Disorder Labs: Lab Results  Component Value Date   HGBA1C 7.3 (A) 06/03/2023   MPG 143 01/08/2023   MPG 182.9 02/14/2022   No results found for: "PROLACTIN" Lab Results  Component Value Date   CHOL 165 12/25/2022   TRIG 78 12/25/2022   HDL 49 12/25/2022   CHOLHDL 3.4 12/25/2022   VLDL 20 12/28/2015   LDLCALC 101 (H) 12/25/2022   LDLCALC 87 06/04/2022   Lab Results   Component Value Date   TSH 2.820 09/25/2020   TSH 2.918 09/21/2013    Therapeutic Level Labs: No results found for: "LITHIUM" No results found for: "VALPROATE" No results found for: "CBMZ"  Current Medications: Current Outpatient Medications  Medication Sig Dispense Refill   Accu-Chek FastClix Lancets MISC Check blood sugar two times a day 102 each 9   acetaminophen (TYLENOL) 500 MG tablet Take 1,000 mg by mouth every 6 (six) hours as needed for moderate pain.     albuterol (PROVENTIL) (2.5 MG/3ML) 0.083% nebulizer solution USE 1 VIAL IN NEBULIZER EVERY 4 HOURS AS NEEDED FOR WHEEZING OR SHORTNESS OF BREATH 150 mL 1   amLODipine (NORVASC) 5 MG tablet Take 1 tablet (5 mg total) by mouth daily. 90 tablet 2   ASPIRIN ADULT LOW STRENGTH 81 MG EC tablet TAKE 1 Tablet BY MOUTH ONCE DAILY 90 tablet 3   atorvastatin (LIPITOR) 80 MG tablet Take 1 tablet (80 mg total) by mouth daily. 90 tablet 2   B-D UF III MINI PEN NEEDLES 31G X 5 MM MISC USE AS DIRECTED DAILY 100 each 5   Blood Glucose Monitoring Suppl (ACCU-CHEK GUIDE) w/Device KIT 1 each by Does not apply route 2 (two) times daily. 1 kit 1   busPIRone (BUSPAR) 15 MG tablet Take 1 tablet (15 mg total) by mouth 2 (two) times daily. 60 tablet 3   cholecalciferol (VITAMIN D3) 25 MCG (1000 UNIT) tablet Take 1,000 Units by mouth daily.     clopidogrel (PLAVIX) 75 MG tablet TAKE 1 TABLET BY MOUTH IN THE MORNING 90 tablet 2   Continuous Glucose Sensor (FREESTYLE LIBRE 3 SENSOR) MISC Place 1 sensor on the  skin every 14 days. Use to check glucose continuously 2 each 11   Continuous Glucose Sensor (FREESTYLE LIBRE 3 SENSOR) MISC PLACE 1 SENSOR ON THE SKIN EVERY 14 DAYS TO CHECK GLUCOSE CONTINUOUSLY 2 each 11   dapagliflozin propanediol (FARXIGA) 5 MG TABS tablet Take 1 tablet (5 mg total) by mouth daily before breakfast. (Patient not taking: Reported on 06/16/2023) 30 tablet 2   DULoxetine (CYMBALTA) 20 MG capsule Take 1 capsule (20 mg total) by mouth  daily. 30 capsule 3   Evolocumab (REPATHA SURECLICK) 140 MG/ML SOAJ INJECT 1 PEN INTO THE SKIN EVERY 14 DAYS 6 mL 3   exemestane (AROMASIN) 25 MG tablet TAKE 1 TABLET BY MOUTH ONCE DAILY AFTER BREAKFAST 90 tablet 3   ezetimibe (ZETIA) 10 MG tablet Take 1 tablet by mouth once daily 90 tablet 2   ferrous sulfate 325 (65 FE) MG tablet Take 1 tablet (325 mg total) by mouth every other day. (Patient not taking: Reported on 05/21/2023) 30 tablet 3   Fluticasone-Umeclidin-Vilant (TRELEGY ELLIPTA) 100-62.5-25 MCG/ACT AEPB INHALE 1 PUFF ONCE DAILY 180 each 0   furosemide (LASIX) 40 MG tablet Take 1 tablet (40 mg total) by mouth as needed for fluid or edema. (Patient not taking: Reported on 03/19/2023) 180 tablet 1   gabapentin (NEURONTIN) 300 MG capsule TAKE 2 CAPSULES BY MOUTH IN THE MORNING AND 3 AT BEDTIME 150 capsule 3   glucose blood (ACCU-CHEK GUIDE) test strip Check blood sugar 2 times per day 100 each 9   hydrocortisone cream 1 % Apply 1 Application topically daily as needed for itching.     losartan-hydrochlorothiazide (HYZAAR) 100-25 MG tablet Take 1 tablet by mouth once daily 90 tablet 0   metFORMIN (GLUCOPHAGE-XR) 500 MG 24 hr tablet Take 1 tablet by mouth twice daily 180 tablet 0   metoprolol succinate (TOPROL-XL) 50 MG 24 hr tablet TAKE 1 TABLET BY MOUTH ONCE DAILY WITH MEALS OR  IMMEDIATELY  FOLLOWING  A  MEAL 90 tablet 2   montelukast (SINGULAIR) 10 MG tablet TAKE 1 TABLET BY MOUTH AT BEDTIME 90 tablet 1   Semaglutide, 1 MG/DOSE, 4 MG/3ML SOPN Inject 1 mg into the skin once a week. 3 mL 0   senna-docusate (SENNA-TIME S) 8.6-50 MG tablet Take 1 tablet by mouth at bedtime as needed for mild constipation. (Patient not taking: Reported on 05/21/2023) 30 tablet 1   traZODone (DESYREL) 50 MG tablet Take 1-2 tablets (50-100 mg total) by mouth at bedtime. 30 tablet 3   VENTOLIN HFA 108 (90 Base) MCG/ACT inhaler INHALE 2 PUFFS BY MOUTH EVERY 6 HOURS AS NEEDED FOR WHEEZING FOR SHORTNESS OF BREATH 18 g 3    No current facility-administered medications for this visit.     Musculoskeletal: Strength & Muscle Tone: within normal limits and Telehealth visit Gait & Station: normal, Telehealth Patient leans: N/A  Psychiatric Specialty Exam: Review of Systems  Last menstrual period 02/03/2012.There is no height or weight on file to calculate BMI.  General Appearance: Well Groomed  Eye Contact:  Good  Speech:  Clear and Coherent and Normal Rate  Volume:  Normal  Mood:  Euthymic  Affect:  Appropriate and Congruent  Thought Process:  Coherent, Goal Directed, and Linear  Orientation:  Full (Time, Place, and Person)  Thought Content: WDL and Logical   Suicidal Thoughts:  No  Homicidal Thoughts:  No  Memory:  Immediate;   Good Recent;   Good Remote;   Good  Judgement:  Good  Insight:  Good  Psychomotor Activity:  Normal  Concentration:  Concentration: Good and Attention Span: Good  Recall:  Good  Fund of Knowledge: Good  Language: Good  Akathisia:  No  Handed:  Right  AIMS (if indicated): not done  Assets:  Communication Skills Desire for Improvement Financial Resources/Insurance Housing Leisure Time Physical Health Social Support  ADL's:  Intact  Cognition: WNL  Sleep:  Good   Screenings: GAD-7    Flowsheet Row Video Visit from 04/29/2023 in [facility] Counselor from 03/05/2023 in [facility] Counselor from 02/16/2023 in [facility] Video Visit from 02/04/2023 in [facility] Counselor from 01/02/2023 in [facility]  Total GAD-7 Score 4 4 2 6 11       PHQ2-9    Flowsheet Row Office Visit from 06/03/2023 in [facility] Video Visit from 04/29/2023 in [facility] Counselor from 03/05/2023 in [facility] Counselor from 02/16/2023 in [facility] Video Visit from 02/04/2023 in [facility]  PHQ-2 Total Score 0 0 0 1 2  PHQ-9 Total Score 1 0 2 4 6       Flowsheet Row Admission (Discharged) from 01/13/2023 in [place] [facility]IOPER[facility]IVE ARE[facility] Pre-Admission Testing 60 from 01/08/2023 in [facility]PRE[facility] ED to Hosp-Admission (Discharged) from 11/16/2022 in [facility] [facility]  C-SSRS RISK CATEGORY No Risk No Risk No Risk        Assessment and Plan: Patient reports that her anxiety and depression are well-managed. Patient reports that she is on Ozempic but is uncertain if she has lost weight. She continues to have back and leg pains due to sciatica.  She reports gabapentin is somewhat effective in managing her pain but informed writer that she has been becoming more forgetful.  Provider informed patient that gabapentin could cause forgetfulness and recommend that she talk to her primary care doctor about adjustments.  She endorsed understanding and agreed.No medication changes made today.  Patient agreeable to continue medication as prescribed.  1. GAD (generalized anxiety disorder)  Continue- busPIRone (BUSPAR) 15 MG tablet; Take 1 tablet (15 mg total) by mouth 2 (two) times daily.  Dispense: 60 tablet; Refill: 3 Continue- DULoxetine (CYMBALTA) 20 MG capsule; Take 1 capsule (20 mg total) by mouth daily.  Dispense: 30 capsule; Refill: 3 Continue- traZODone (DESYREL) 50 MG tablet; Take 1-2 tablets (50-100 mg total) by mouth at bedtime.  Dispense: 30 tablet; Refill: 3  2. PTSD (post-traumatic stress disorder)  Continue- busPIRone (BUSPAR) 15 MG tablet; Take 1 tablet (15 mg total) by mouth 2 (two) times daily.  Dispense: 60 tablet; Refill: 3 Continue- traZOdone (DESYREL) 50 MG tablet; Take 1-2 tablets (50-100 mg total) by mouth at bedtime.  Dispense: 30 tablet; Refill: 3  3. Mild depression  Continue- busPIRone (BUSPAR) 15 MG tablet; Take 1 tablet (15 mg total) by  mouth 2 (two) times daily.  Dispense: 60 tablet; Refill: 3 - traZODone (DESYREL) 50 MG tablet; Take 1-2 tablets (50-100 mg total) by mouth at bedtime.  Dispense: 30 tablet; Refill: 3 Continue- DULoxetine (CYMBALTA) 20 MG capsule; Take 1 capsule (20 mg total) by mouth daily.  Dispense: 30 capsule; Refill: 3    Collaboration of Care: Collaboration of Care: Other provider involved in patient's care AEB PCP and counselor  Patient/Guardian was advised Release of Information must be obtained prior to any record release  in order to collaborate their care with an outside provider. Patient/Guardian was advised if they have not already done so to contact the registration department to sign all necessary forms in order for [place] to release information regarding their care.   Consent: Patient/Guardian gives verbal consent for treatment and assignment of benefits for services provided during this visit. Patient/Guardian expressed understanding and agreed to proceed.   Follow-up in 2.5 months Follow-up therapy Shanna Cisco, NP 07/08/2023, 2:11 PM

## 2023-07-09 ENCOUNTER — Other Ambulatory Visit: Payer: Medicaid Other | Admitting: Pharmacist

## 2023-07-09 NOTE — Patient Instructions (Addendum)
Ona,   Keep up the great work!  I will place an order for a BP cuff from [facility]. They will be calling from a [place] area code.   I'll forward my thoughts to Dr. Colbert Coyer.   Thanks!  Catie Eppie Gibson, PharmD, BCACP, CPP Clinical Pharmacist [facility] [phone]

## 2023-07-09 NOTE — Progress Notes (Signed)
07/09/2023 Name: [name] MRN: [id number] DOB: February 25, 1961  Chief Complaint  Patient presents with   Medication Management   Diabetes    [name] is a 62 y.o. year old female who presented for a telephone visit.   They were referred to the pharmacist by their PCP for assistance in managing diabetes, hypertension, and hyperlipidemia.    Subjective:  Care Team: Primary Care Provider: Colbert Coyer, Alyson Locket, MD ; Next Scheduled Visit:   Medication Access/Adherence  Current Pharmacy:  [facility] [address] [place] - [address] [place] [address] Phone: [phone] Fax: [phone]   Patient reports affordability concerns with their medications: No  Patient reports access/transportation concerns to their pharmacy: No  Patient reports adherence concerns with their medications:  No     Diabetes:  Current medications: metformin XR 500 mg twice, Ozempic 1 mg weekly  Medications tried in the past: tried [place], but reports she had dizziness/lightheadedness. Had not checked her blood pressure; does not appear she has been on a higher dose of metformin XR before  Date of Download: 06/26/23-07/09/23 % Time CGM is active: 82% Average Glucose: 171 mg/dL Glucose Management Indicator: 7.4%  Glucose Variability: 17.9% (goal <36%) Time in Goal:  - Time in range 70-180: 69% - Time above range: 31% - Time below range: 0%      Patient denies hypoglycemic s/sx including dizziness, shakiness, sweating. Patient denies hyperglycemic symptoms including polyuria, polydipsia, polyphagia, nocturia, neuropathy, blurred vision.  Hypertension:  Current medications: amlodipine 5 mg daily, losartan/hydrochlorothiazide 100/25 mg daily, metoprolol succinate 50 mg daily  Patient has a wrist home BP cuff Current blood pressure readings readings: not checking  Patient reports hypotensive s/sx including dizziness,  lightheadedness. In retrospect, wonders if her symptoms she reported with Marcelline Deist could be related to hypotension   Hyperlipidemia/ASCVD Risk Reduction  Current lipid lowering medications: atorvastatin 80 mg, ezetimibe 10 mg daily, Repatha 140 mg every 2 weeks  Antiplatelet regimen: clopidogrel 75 mg daily  Objective:  Lab Results  Component Value Date   HGBA1C 7.3 (A) 06/03/2023    Lab Results  Component Value Date   CREATININE 0.89 01/08/2023   BUN 20 01/08/2023   NA 136 01/08/2023   K 3.9 01/08/2023   CL 98 01/08/2023   CO2 29 01/08/2023    Lab Results  Component Value Date   CHOL 165 12/25/2022   HDL 49 12/25/2022   LDLCALC 101 (H) 12/25/2022   LDLDIRECT 162.0 10/17/2013   TRIG 78 12/25/2022   CHOLHDL 3.4 12/25/2022    Medications Reviewed Today     Reviewed by Alden Hipp, RPH-CPP (Pharmacist) on 07/09/23 at 1312  Med List Status: <None>   Medication Order Taking? Sig Documenting Provider Last Dose Status Informant  acetaminophen (TYLENOL) 500 MG tablet [id number]  Take 1,000 mg by mouth every 6 (six) hours as needed for moderate pain. [provider]  Active Self  amLODipine (NORVASC) 5 MG tablet [id number] Yes Take 1 tablet (5 mg total) by mouth daily. Evlyn Kanner, MD Taking Active   atorvastatin (LIPITOR) 80 MG tablet [id number] Yes Take 1 tablet (80 mg total) by mouth daily. Evlyn Kanner, MD Taking Active   busPIRone (BUSPAR) 15 MG tablet [id number] Yes Take 1 tablet (15 mg total) by mouth 2 (two) times daily. Shanna Cisco, NP Taking Active   cholecalciferol (VITAMIN D3) 25 MCG (1000 UNIT) tablet [id number]  Take 1,000 Units by mouth daily. [provider]  Active Self  clopidogrel (PLAVIX) 75 MG tablet [id number] Yes TAKE 1 TABLET BY MOUTH IN THE Wylie Hail, MD Taking Active   Continuous Glucose Sensor (FREESTYLE LIBRE 3 SENSOR) [place] [id number] Yes Place 1 sensor on the skin every 14 days. Use to check  glucose continuously Colbert Coyer, Alyson Locket, MD Taking Active   Continuous Glucose Sensor (FREESTYLE LIBRE 3 SENSOR) [place] [id number] Yes PLACE 1 SENSOR ON THE SKIN EVERY 14 DAYS TO CHECK GLUCOSE CONTINUOUSLY Rana Snare, DO Taking Active   DULoxetine (CYMBALTA) 20 MG capsule [id number] Yes Take 1 capsule (20 mg total) by mouth daily. Shanna Cisco, NP Taking Active   Evolocumab [facility] SURECLICK) 140 MG/ML Ivory Broad [id number] Yes INJECT 1 PEN INTO THE SKIN EVERY 14 DAYS Kathleene Hazel, MD Taking Active   exemestane (AROMASIN) 25 MG tablet [id number]  TAKE 1 TABLET BY MOUTH ONCE DAILY AFTER Philippa Sicks, MD  Active Self  ezetimibe (ZETIA) 10 MG tablet [id number] Yes Take 1 tablet by mouth once daily Swinyer, Zachary George, NP Taking Active   ferrous sulfate 325 (65 FE) MG tablet [id number]  Take 1 tablet (325 mg total) by mouth every other day.  Patient not taking: Reported on 05/21/2023   Merrilyn Puma, MD  Expired 07/08/23 2359 Self    Discontinued 05/09/20 1429   Fluticasone-Umeclidin-Vilant (TRELEGY ELLIPTA) 100-62.5-25 MCG/ACT AEPB [id number]  INHALE 1 PUFF ONCE DAILY Evlyn Kanner, MD  Active   furosemide (LASIX) 40 MG tablet [id number] No Take 1 tablet (40 mg total) by mouth as needed for fluid or edema.  Patient not taking: Reported on 03/19/2023   Chauncey Mann, DO Not Taking Active Self  gabapentin (NEURONTIN) 300 MG capsule [id number] Yes TAKE 2 CAPSULES BY MOUTH IN THE MORNING AND 3 AT BEDTIME Steffanie Rainwater, MD Taking Active            Med Note Clearance Coots, Denika Krone T   Thu Jul 09, 2023  1:10 PM) Taking 1 QAM, 3 QPM  hydrocortisone cream 1 % [id number]  Apply 1 Application topically daily as needed for itching. [provider]  Active Self  losartan-hydrochlorothiazide (HYZAAR) 100-25 MG tablet [id number] Yes Take 1 tablet by mouth once daily Adron Bene, MD Taking Active   metFORMIN (GLUCOPHAGE-XR) 500 MG 24 hr tablet [id number] Yes Take 1 tablet by  mouth twice daily Evlyn Kanner, MD Taking Active   metoprolol succinate (TOPROL-XL) 50 MG 24 hr tablet [id number] Yes TAKE 1 TABLET BY MOUTH ONCE DAILY WITH MEALS OR  IMMEDIATELY  FOLLOWING  A  MEAL Evlyn Kanner, MD Taking Active Self  montelukast (SINGULAIR) 10 MG tablet [id number] Yes TAKE 1 TABLET BY MOUTH AT BEDTIME Steffanie Rainwater, MD Taking Active   Semaglutide, 1 MG/DOSE, 4 MG/3ML SOPN [id number] Yes Inject 1 mg into the skin once a week. Adron Bene, MD Taking Active   senna-docusate (SENNA-TIME S) 8.6-50 MG tablet [id number]  Take 1 tablet by mouth at bedtime as needed for mild constipation.  Patient not taking: Reported on 05/21/2023   Merrilyn Puma, MD  Active Self  traZODone (DESYREL) 50 MG tablet [id number] Yes Take 1-2 tablets (50-100 mg total) by mouth at bedtime. Shanna Cisco, NP Taking Active   VENTOLIN HFA 108 (90 Base) MCG/ACT inhaler [id number]  INHALE 2 PUFFS BY MOUTH EVERY 6 HOURS AS NEEDED FOR WHEEZING FOR SHORTNESS OF Alben Spittle, MD  Active Self              Assessment/Plan:   Diabetes: -  Currently uncontrolled - Reviewed long term cardiovascular and renal outcomes of uncontrolled blood sugar - Reviewed goal A1c, goal fasting, and goal 2 hour post prandial glucose - Wonder if [place] intolerance could be related to hypotensive symptoms. At next visit, recommend consideration for 1) increasing Ozempic to 2 mg weekly (though may not provide much additional glucose lowering), 2) increase metformin XR to 2 tablets twice daily, or 3) retry Marcelline Deist but hold amlodipine, reduce furosemide to 20 mg PRN instead of 40 mg. Will send to new PC - Recommend to check glucose continuously using CGM  Hypertension: - Currently controlled - Reviewed long term cardiovascular and renal outcomes of uncontrolled blood pressure - Reviewed appropriate blood pressure monitoring technique and reviewed goal blood pressure. Recommended to check home blood  pressure and heart rate periodically. Will place order for cuff from [facility].  - Recommend to continue current regimen at this time. We discussed that possible weight loss could also lower blood pressure. If hypotensive symptoms or readings, recommend to hold amlodipine.     Hyperlipidemia/ASCVD Risk Reduction: - Currently unknown control, due for lipid recheck.  - Recommend to continue current regimen at this time   Follow Up Plan: PCP in 6 weeks, PharmD in ~ 10  Catie TClearance Coots, PharmD, BCACP, CPP Clinical Pharmacist [facility] [phone]

## 2023-07-15 ENCOUNTER — Encounter: Payer: Self-pay | Admitting: Gastroenterology

## 2023-07-20 ENCOUNTER — Telehealth: Payer: Self-pay

## 2023-07-20 ENCOUNTER — Other Ambulatory Visit: Payer: Self-pay

## 2023-07-20 DIAGNOSIS — E119 Type 2 diabetes mellitus without complications: Secondary | ICD-10-CM

## 2023-07-20 MED ORDER — SEMAGLUTIDE (1 MG/DOSE) 4 MG/3ML ~~LOC~~ SOPN
1.0000 mg | PEN_INJECTOR | SUBCUTANEOUS | 0 refills | Status: DC
Start: 2023-07-20 — End: 2023-08-18

## 2023-07-20 NOTE — Telephone Encounter (Signed)
Decision: Approved [facility] (Key: [facility]) Rx #: [id number] Ozempic (1 MG/DOSE) 4MG Ronny Bacon pen-injectors Form [employer] Prior Authorization Request Form [id number] NCPDP) Created Message from Plan Approved. This drug has been approved. Approved quantity: 3 MLs per 28 day(s). You may fill up to a 34 day supply at a retail pharmacy. You may fill up to a 90 day supply for maintenance drugs, please refer to the formulary for details. Please call the pharmacy to process your prescription claim.. Authorization Expiration Date: July 19, 2024.

## 2023-07-20 NOTE — Telephone Encounter (Addendum)
Prior Authorization for patient (Ozempic) came through on cover my meds was submitted with last office notes and labs awaiting approval or denial.  WUJ:[id number]

## 2023-07-21 ENCOUNTER — Encounter: Payer: Self-pay | Admitting: Obstetrics and Gynecology

## 2023-07-21 ENCOUNTER — Other Ambulatory Visit: Payer: Medicaid Other | Admitting: Obstetrics and Gynecology

## 2023-07-21 NOTE — Patient Outreach (Signed)
Medicaid Managed Care   Nurse Care Manager Note  07/21/2023 Name:  [name] MRN:  [id number] DOB:  [birth year]  [name] is an 62 y.o. year old female who is a primary patient of Philomena Doheny, MD.  The Medicaid Managed Care Coordination team was consulted for assistance with:    Chronic healthcare management needs, GERD, OSA, COPD, HF , asthma, h/o breast cancer, sciatica, CAD, HTN, arthritis, HLD, RLS, DM, anxiety/depression/PTSD  [name] was given information about Medicaid Managed Care Coordination team services today. [name] Patient agreed to services and verbal consent obtained.  Engaged with patient by telephone for follow up visit in response to provider referral for case management and/or care coordination services.   Assessments/Interventions:  Review of past medical history, allergies, medications, health status, including review of consultants reports, laboratory and other test data, was performed as part of comprehensive evaluation and provision of chronic care management services.  SDOH (Social Determinants of Health) assessments and interventions performed: SDOH Interventions    Flowsheet Row Patient Outreach Telephone from 07/21/2023 in [name] Patient Outreach Telephone from 07/07/2023 in [name] Patient Outreach Telephone from 06/16/2023 in [name] Office Visit from 06/03/2023 in [name] Patient Outreach Telephone from 05/12/2023 in [name] Patient Outreach Telephone from 05/04/2023 in [name]  SDOH Interventions        Food Insecurity Interventions -- -- Intervention Not Indicated -- -- --  Housing Interventions -- -- Intervention Not Indicated -- -- --  Depression Interventions/Treatment  -- -- -- Currently on Treatment -- --  Financial Strain  Interventions -- -- -- -- Intervention Not Indicated --  Physical Activity Interventions Other (Comments)  [not able to perform exercise at this level] -- -- -- -- --  Stress Interventions -- Provide Counseling, Offered [facility] therapy per her choice yesterday] -- -- -- Environmental health practitioner, Provide Counseling  Health Literacy Interventions Intervention Not Indicated -- -- -- -- --     Care Plan  No Known Allergies  Medications Reviewed Today     Reviewed by [name], RN (Registered Nurse) on 07/21/23 at 1051  Med List Status: <None>   Medication Order Taking? Sig Documenting Provider Last Dose Status Informant  acetaminophen (TYLENOL) 500 MG tablet [id number] No Take 1,000 mg by mouth every 6 (six) hours as needed for moderate pain. [provider] Taking Active Self  amLODipine (NORVASC) 5 MG tablet [id number] No Take 1 tablet (5 mg total) by mouth daily. Evlyn Kanner, MD Taking Active   atorvastatin (LIPITOR) 80 MG tablet [id number] No Take 1 tablet (80 mg total) by mouth daily. Evlyn Kanner, MD Taking Active   busPIRone (BUSPAR) 15 MG tablet [id number] No Take 1 tablet (15 mg total) by mouth 2 (two) times daily. Shanna Cisco, NP Taking Active   cholecalciferol (VITAMIN D3) 25 MCG (1000 UNIT) tablet [id number] No Take 1,000 Units by mouth daily. [provider] Taking Active Self  clopidogrel (PLAVIX) 75 MG tablet [id number] No TAKE 1 TABLET BY MOUTH IN THE Wylie Hail, MD Taking Active   Continuous Glucose Sensor (FREESTYLE LIBRE 3 SENSOR) [place] [id number] No Place 1 sensor on the skin every 14 days. Use to check glucose continuously Philomena Doheny, MD Taking Active   Continuous Glucose Sensor (FREESTYLE LIBRE 3 SENSOR) [place] [id number] No PLACE 1 SENSOR  ON THE SKIN EVERY 14 DAYS TO CHECK GLUCOSE CONTINUOUSLY Rana Snare, DO Taking Active   DULoxetine (CYMBALTA) 20 MG capsule  [id number] No Take 1 capsule (20 mg total) by mouth daily. Shanna Cisco, NP Taking Active   Evolocumab [facility] SURECLICK) 140 MG/ML Ivory Broad [id number] No INJECT 1 PEN INTO THE SKIN EVERY 14 DAYS Kathleene Hazel, MD Taking Active   exemestane (AROMASIN) 25 MG tablet [id number] No TAKE 1 TABLET BY MOUTH ONCE DAILY AFTER Philippa Sicks, MD Taking Active Self  ezetimibe (ZETIA) 10 MG tablet [id number] No Take 1 tablet by mouth once daily Swinyer, Zachary George, NP Taking Active   ferrous sulfate 325 (65 FE) MG tablet [id number] No Take 1 tablet (325 mg total) by mouth every other day.  Patient not taking: Reported on 05/21/2023   Merrilyn Puma, MD Not Taking Expired 07/08/23 2359 Self  Discontinued 05/09/20 1429   Fluticasone-Umeclidin-Vilant (TRELEGY ELLIPTA) 100-62.5-25 MCG/ACT AEPB [id number] No INHALE 1 PUFF ONCE DAILY Evlyn Kanner, MD Taking Active   furosemide (LASIX) 40 MG tablet [id number] No Take 1 tablet (40 mg total) by mouth as needed for fluid or edema.  Patient not taking: Reported on 03/19/2023   Chauncey Mann, DO Not Taking Active Self  gabapentin (NEURONTIN) 300 MG capsule [id number] No TAKE 2 CAPSULES BY MOUTH IN THE MORNING AND 3 AT BEDTIME Steffanie Rainwater, MD Taking Active            Med Note Clearance Coots, CATHERINE T   Thu Jul 09, 2023  1:10 PM) Taking 1 QAM, 3 QPM  hydrocortisone cream 1 % [id number] No Apply 1 Application topically daily as needed for itching. [provider] Taking Active Self  losartan-hydrochlorothiazide (HYZAAR) 100-25 MG tablet [id number] No Take 1 tablet by mouth once daily Adron Bene, MD Taking Active   metFORMIN (GLUCOPHAGE-XR) 500 MG 24 hr tablet [id number] No Take 1 tablet by mouth twice daily Evlyn Kanner, MD Taking Active   metoprolol succinate (TOPROL-XL) 50 MG 24 hr tablet [id number] No TAKE 1 TABLET BY MOUTH ONCE DAILY WITH MEALS OR  IMMEDIATELY  FOLLOWING  A  MEAL Evlyn Kanner, MD Taking Active Self   montelukast (SINGULAIR) 10 MG tablet [id number] No TAKE 1 TABLET BY MOUTH AT BEDTIME Steffanie Rainwater, MD Taking Active   Semaglutide, 1 MG/DOSE, 4 MG/3ML SOPN [id number]  Inject 1 mg into the skin once a week. Chauncey Mann, DO  Active   senna-docusate (SENNA-TIME S) 8.6-50 MG tablet [id number] No Take 1 tablet by mouth at bedtime as needed for mild constipation.  Patient not taking: Reported on 05/21/2023   Merrilyn Puma, MD Not Taking Active Self  traZODone (DESYREL) 50 MG tablet [id number] No Take 1-2 tablets (50-100 mg total) by mouth at bedtime. Shanna Cisco, NP Taking Active   VENTOLIN HFA 108 (90 Base) MCG/ACT inhaler [id number] No INHALE 2 PUFFS BY MOUTH EVERY 6 HOURS AS NEEDED FOR WHEEZING FOR SHORTNESS OF Alben Spittle, MD Taking Active Self           Patient Active Problem List   Diagnosis Date Noted   Colon cancer screening 06/03/2023   Abdominal hernia 03/19/2023   History of UTI 12/05/2022   COPD exacerbation (HCC) 11/16/2022   Diabetic neuropathy (HCC) 08/29/2022   Vaginal lesion 08/29/2022   PTSD (post-traumatic stress disorder) 06/24/2022   GAD (generalized anxiety disorder) 06/24/2022   Major depressive disorder, recurrent episode, moderate (HCC) 06/24/2022   Urinary incontinence 06/20/2022   Intertrigo 06/20/2022  Arthritis of left shoulder region    S/P reverse total shoulder arthroplasty, 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   Healthcare maintenance 11/17/2021   Spondylolisthesis, lumbar region 02/11/2021    Class: Acute   Malignant neoplasm of upper-outer quadrant of right breast in female, estrogen receptor positive (HCC) 06/10/2018   Restless leg syndrome 02/04/2018   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   Depression 08/06/2015   Hypertension associated with diabetes (HCC)    Aortic stenosis s/p TAVR    Chronic diastolic congestive heart failure (HCC)    COPD (chronic obstructive pulmonary disease) (HCC) 09/21/2013   Obstructive sleep  apnea 09/21/2013   Nephrolithiasis 09/21/2013   Nocturnal hypoxemia 09/12/2013   Hyperlipidemia 11/30/2012   History of pulmonary embolus (PE) 07/09/2012   Normocytic anemia 02/08/2012   Type 2 diabetes mellitus without complication, without long-term current use of insulin (HCC) 02/08/2012   Asthma 02/08/2012   GERD (gastroesophageal reflux disease)    CAD (coronary artery disease) 12/08/2011   Conditions to be addressed/monitored per PCP order:  Chronic healthcare management needs, GERD, OSA, COPD, HF , asthma, h/o breast cancer, sciatica, CAD, HTN, arthritis, HLD, RLS, DM, anxiety/depression/PTSD  Care Plan : RN Care Manager Plan of Care  Updates made by [name], RN since 07/21/2023 12:00 AM     Problem: Health Promotion or Disease Self-Management (General Plan of Care)   Priority: High  Onset Date: 04/14/2022     Long-Range Goal: Chronic Disease Management and Care Coordination Needs   Start Date: 04/14/2022  Expected End Date: 09/16/2023  Recent Progress: Not on track  Priority: High  Note:   Current Barriers:  Knowledge Deficits related to plan of care for management of Anxiety and Depression along with other chronic health conditions, HTN, DM, HF, asthma, COPD, OSA, arthritis, GERD, CAD, HLD, h/o breast cancer, RLS Chronic Disease Management support and education needs related to Anxiety and Depression. DM, HTN, HF, asthma, OSA, arthritis, GERD, CAD, HLD, h/o breast cancer, RLS. 07/21/23:  Does not check BP.  Blood sugar 150-160 range.  Sciatica continues with shoulder pain as well-has upcoming ORTHO appt.  RNCM Clinical Goal(s):  Patient will verbalize understanding of plan for management of Anxiety and Depression, DM, HTN  as evidenced by patient report take all medications exactly as prescribed and will call provider for medication related questions as evidenced by patient report demonstrate understanding of rationale for each prescribed medication as evidenced by  patient report attend all scheduled medical appointments  as evidenced by patient report continue to work with RN Care Manager to address care management and care coordination needs related to  Anxiety and Depression, DM, HTN as evidenced by adherence to CM Team Scheduled appointments work with Child psychotherapist to address  related to the management of Mental Health Concerns  related to the management of Anxiety and Depression as evidenced by review of EMR and patient or Child psychotherapist report through collaboration with Medical illustrator, provider, and care team.   Interventions: Inter-disciplinary care team collaboration (see longitudinal plan of care) Evaluation of current treatment plan related to  self management and patient's adherence to plan as established by provider   (Status:  New goal.)  Long Term Goal Evaluation of current treatment plan related to Anxiety and Depression, Mental Health Concerns  self-management and patient's adherence to plan as established by provider. Discussed plans with patient for ongoing care management follow up and provided patient with direct contact information for care management team Advised patient to contact provider for any additional  DME needs, CBG, and possible general surgeon referral. Reviewed medications with patient Collaborated with LCSW regarding anxiety/depression Reviewed scheduled/upcoming provider appointments Advised patient, providing education and rationale, to check cbg and record, calling provider  for findings outside established parameters  Social Work referral for anxiety/depression Discussed plans with patient for ongoing care management follow up and provided patient with direct contact information for care management team Assessed social determinant of health barriers Collaborated with Lubertha Sayres Managed Medicaid Liaison regarding DME-motorized wheelchair and lift chair. LCSW appointment rescheduled. Collaborated with  Pharmacist. Pharmacy referral for polypharmacy at patient's request-completed  Asthma: (Status:New goal.) Long Term Goal Advised patient to track and manage Asthma triggers Advised patient to engage in light exercise as tolerated 3-5 days a week to aid in the the management of Asthma Provided education about and advised patient to utilize infection prevention strategies to reduce risk of respiratory infection Discussed the importance of adequate rest and management of fatigue with Asthma Assessed social determinant of health barriers   CAD Interventions: (Status:  New goal.) Long Term Goal Assessed understanding of CAD diagnosis Medications reviewed including medications utilized in CAD treatment plan Provided education on importance of blood pressure control in management of CAD Provided education on Importance of limiting foods high in cholesterol Counseled on importance of regular laboratory monitoring as prescribed Counseled on the importance of exercise goals with target of 150 minutes per week Reviewed Importance of taking all medications as prescribed Reviewed Importance of attending all scheduled provider appointments Advised to report any changes in symptoms or exercise tolerance Assessed social determinant of health barriers  Heart Failure Interventions:  (Status:  New goal.) Long Term Goal Basic overview and discussion of pathophysiology of Heart Failure reviewed Provided education on low sodium diet Assessed need for readable accurate scales in home Provided education about placing scale on hard, flat surface Advised patient to weigh each morning after emptying bladder Discussed importance of daily weight and advised patient to weigh and record daily Discussed the importance of keeping all appointments with provider Provided patient with education about the role of exercise in the management of heart failure Assessed social determinant of health barriers   COPD  Interventions:  (Status:  New goal.) Long Term Goal Advised patient to track and manage COPD triggers Advised patient to self assesses COPD action plan zone and make appointment with provider if in the yellow zone for 48 hours without improvement Advised patient to engage in light exercise as tolerated 3-5 days a week to aid in the the management of COPD Provided education about and advised patient to utilize infection prevention strategies to reduce risk of respiratory infection Discussed the importance of adequate rest and management of fatigue with COPD Assessed social determinant of health barriers  Hyperlipidemia Interventions:  (Status:  New goal.) Long Term Goal Medication review performed; medication list updated in electronic medical record.  Provider established cholesterol goals reviewed Counseled on importance of regular laboratory monitoring as prescribed Reviewed importance of limiting foods high in cholesterol Reviewed exercise goals and target of 150 minutes per week Assessed social determinant of health barriers    Diabetes Interventions:  (Status:  New goal.) Long Term Goal Assessed patient's understanding of A1c goal: <7% Reviewed medications with patient and discussed importance of medication adherence Reviewed scheduled/upcoming provider appointments  Advised patient, providing education and rationale, to check cbg and record, calling provider for findings outside established parameters Review of patient status, including review of consultants reports, relevant laboratory and other test results, and  medications completed Assessed social determinant of health barriers Lab Results  Component Value Date   HGBA1C 7.1 (A) 08/27/2022  HGB A1C  6.6 on 01/08/23 HGB A1C  7.3 on 06/03/23  Hypertension Interventions:  (Status:  New goal.) Long Term Goal Last practice recorded BP readings:  BP Readings from Last 3 Encounters:  09/12/22 133/69  08/27/22 (!) 109/58  07/01/22  137/75  12/25/22          124/60 03/18/23         130/50 06/03/23         127/59  Most recent eGFR/CrCl:  Lab Results  Component Value Date   EGFR 90 06/04/2022    No components found for: "CRCL"  Evaluation of current treatment plan related to hypertension self management and patient's adherence to plan as established by provider Reviewed medications with patient and discussed importance of compliance Discussed plans with patient for ongoing care management follow up and provided patient with direct contact information for care management team Advised patient, providing education and rationale, to monitor blood pressure daily and record, calling PCP for findings outside established parameters Reviewed scheduled/upcoming provider appointments including:  Assessed social determinant of health barriers   Patient Goals/Self-Care Activities: Take all medications as prescribed Attend all scheduled provider appointments Call pharmacy for medication refills 3-7 days in advance of running out of medications Perform all self care activities independently  Perform IADL's (shopping, preparing meals, housekeeping, managing finances) independently Call provider office for new concerns or questions  Work with the social worker to address care coordination needs and will continue to work with the clinical team to address health care and disease management related needs Patient to f/u with provider regarding sciatica pain-completed  Follow Up Plan:  The patient has been provided with contact information for the care management team and has been advised to call with any health related questions or concerns.  The care management team will reach out to the patient again over the next 60 business  days.    Long-Range Goal: Establish Plan of Care for Chronic Disease Management Needs   Priority: High  Note:   Timeframe:  Long-Range Goal Priority:  High Start Date:      04/14/22                        Expected End Date:     ongoing                  Follow Up Date 08/21/23   - schedule appointment for flu shot - schedule appointment for vaccines needed due to my age or health - schedule recommended health tests (blood work, mammogram, colonoscopy, pap test) - schedule and keep appointment for annual check-up    Why is this important?   Screening tests can find diseases early when they are easier to treat.  Your doctor or nurse will talk with you about which tests are important for you.  Getting shots for common diseases like the flu and shingles will help prevent them.  07/21/23:  Upcoming PCP, ORTHO, GI, and ONC appt   Follow Up:  Patient agrees to Care Plan and Follow-up.  Plan: The Managed Medicaid care management team will reach out to the patient again over the next 30 business  days. and The  Patient has been provided with contact information for the Managed Medicaid care management team and has been advised to call with any health related questions or concerns.  Date/time of next scheduled RN care management/care coordination outreach: 08/21/23 at 1030.

## 2023-07-21 NOTE — Patient Instructions (Signed)
Hi Ms. Kreischer, glad you are having a great day so far!  I hope all of your upcoming appointments go well!  Ms. Chimenti was given information about Medicaid Managed Care team care coordination services as a part of their [facility] Medicaid benefit. [name] verbally consented to engagement with the [facility] Managed Care team.   If you are experiencing a medical emergency, please call 911 or report to your local emergency department or urgent care.   If you have a non-emergency medical problem during routine business hours, please contact your provider's office and ask to speak with a nurse.   For questions related to your [facility] health plan, please call: [phone] or go here:[email or web address]  If you would like to schedule transportation through your [facility] plan, please call the following number at least 2 days in advance of your appointment: [phone].   You can also use the MTM portal or MTM mobile app to manage your rides. Reimbursement for transportation is available through [facility]! For the portal, please go to [email or web address]  Call the [facility] Crisis Line at [phone], at any time, 24 hours a day, 7 days a week. If you are in danger or need immediate medical attention call 911.  If you would like help to quit smoking, call 1-800-QUIT-NOW (2038394087) OR Espaol: 1-855-Djelo-Ya (4-403-474-2595) o para ms informacin haga clic aqu or Text READY to 638-756 to register via text  [name] - following are the goals we discussed in your visit today:   Goals Addressed    Timeframe:  Long-Range Goal Priority:  High Start Date:      04/14/22                       Expected End Date:     ongoing                  Follow Up Date 08/21/23   - schedule appointment for flu shot - schedule appointment for vaccines needed due to my age or health - schedule recommended health tests (blood work, mammogram, colonoscopy, pap  test) - schedule and keep appointment for annual check-up    Why is this important?   Screening tests can find diseases early when they are easier to treat.  Your doctor or nurse will talk with you about which tests are important for you.  Getting shots for common diseases like the flu and shingles will help prevent them.  07/21/23:  Upcoming PCP, ORTHO, GI, and ONC appt  Patient verbalizes understanding of instructions and care plan provided today and agrees to view in MyChart. Active MyChart status and patient understanding of how to access instructions and care plan via MyChart confirmed with patient.     The Managed Medicaid care management team will reach out to the patient again over the next 30 business  days.  The  Patient  has been provided with contact information for the Managed Medicaid care management team and has been advised to call with any health related questions or concerns.   [name] RN, BSN [place]  [facility] Care Management Coordinator - Managed Medicaid High Risk [phone]   Following is a copy of your plan of care:  Care Plan : RN Care Manager Plan of Care  Updates made by [name], RN since 07/21/2023 12:00 AM     Problem: Health Promotion or Disease Self-Management (General Plan of Care)   Priority: High  Onset Date: 04/14/2022  Long-Range Goal: Chronic Disease Management and Care Coordination Needs   Start Date: 04/14/2022  Expected End Date: 09/16/2023  Recent Progress: Not on track  Priority: High  Note:   Current Barriers:  Knowledge Deficits related to plan of care for management of Anxiety and Depression along with other chronic health conditions, HTN, DM, HF, asthma, COPD, OSA, arthritis, GERD, CAD, HLD, h/o breast cancer, RLS Chronic Disease Management support and education needs related to Anxiety and Depression. DM, HTN, HF, asthma, OSA, arthritis, GERD, CAD, HLD, h/o breast cancer, RLS. 07/21/23:  Does not check  BP.  Blood sugar 150-160 range.  Sciatica continues with shoulder pain as well-has upcoming ORTHO appt.  RNCM Clinical Goal(s):  Patient will verbalize understanding of plan for management of Anxiety and Depression, DM, HTN  as evidenced by patient report take all medications exactly as prescribed and will call provider for medication related questions as evidenced by patient report demonstrate understanding of rationale for each prescribed medication as evidenced by patient report attend all scheduled medical appointments  as evidenced by patient report continue to work with RN Care Manager to address care management and care coordination needs related to  Anxiety and Depression, DM, HTN as evidenced by adherence to CM Team Scheduled appointments work with Child psychotherapist to address  related to the management of Mental Health Concerns  related to the management of Anxiety and Depression as evidenced by review of EMR and patient or Child psychotherapist report through collaboration with Medical illustrator, provider, and care team.   Interventions: Inter-disciplinary care team collaboration (see longitudinal plan of care) Evaluation of current treatment plan related to  self management and patient's adherence to plan as established by provider   (Status:  New goal.)  Long Term Goal Evaluation of current treatment plan related to Anxiety and Depression, Mental Health Concerns  self-management and patient's adherence to plan as established by provider. Discussed plans with patient for ongoing care management follow up and provided patient with direct contact information for care management team Advised patient to contact provider for any additional DME needs, CBG, and possible general surgeon referral. Reviewed medications with patient Collaborated with LCSW regarding anxiety/depression Reviewed scheduled/upcoming provider appointments Advised patient, providing education and rationale, to check cbg and record,  calling provider  for findings outside established parameters  Social Work referral for anxiety/depression Discussed plans with patient for ongoing care management follow up and provided patient with direct contact information for care management team Assessed social determinant of health barriers Collaborated with Lubertha Sayres Managed Medicaid Liaison regarding DME-motorized wheelchair and lift chair. LCSW appointment rescheduled. Collaborated with Pharmacist. Pharmacy referral for polypharmacy at patient's request-completed  Asthma: (Status:New goal.) Long Term Goal Advised patient to track and manage Asthma triggers Advised patient to engage in light exercise as tolerated 3-5 days a week to aid in the the management of Asthma Provided education about and advised patient to utilize infection prevention strategies to reduce risk of respiratory infection Discussed the importance of adequate rest and management of fatigue with Asthma Assessed social determinant of health barriers   CAD Interventions: (Status:  New goal.) Long Term Goal Assessed understanding of CAD diagnosis Medications reviewed including medications utilized in CAD treatment plan Provided education on importance of blood pressure control in management of CAD Provided education on Importance of limiting foods high in cholesterol Counseled on importance of regular laboratory monitoring as prescribed Counseled on the importance of exercise goals with target of 150 minutes per week Reviewed Importance of  taking all medications as prescribed Reviewed Importance of attending all scheduled provider appointments Advised to report any changes in symptoms or exercise tolerance Assessed social determinant of health barriers  Heart Failure Interventions:  (Status:  New goal.) Long Term Goal Basic overview and discussion of pathophysiology of Heart Failure reviewed Provided education on low sodium diet Assessed need for  readable accurate scales in home Provided education about placing scale on hard, flat surface Advised patient to weigh each morning after emptying bladder Discussed importance of daily weight and advised patient to weigh and record daily Discussed the importance of keeping all appointments with provider Provided patient with education about the role of exercise in the management of heart failure Assessed social determinant of health barriers   COPD Interventions:  (Status:  New goal.) Long Term Goal Advised patient to track and manage COPD triggers Advised patient to self assesses COPD action plan zone and make appointment with provider if in the yellow zone for 48 hours without improvement Advised patient to engage in light exercise as tolerated 3-5 days a week to aid in the the management of COPD Provided education about and advised patient to utilize infection prevention strategies to reduce risk of respiratory infection Discussed the importance of adequate rest and management of fatigue with COPD Assessed social determinant of health barriers  Hyperlipidemia Interventions:  (Status:  New goal.) Long Term Goal Medication review performed; medication list updated in electronic medical record.  Provider established cholesterol goals reviewed Counseled on importance of regular laboratory monitoring as prescribed Reviewed importance of limiting foods high in cholesterol Reviewed exercise goals and target of 150 minutes per week Assessed social determinant of health barriers    Diabetes Interventions:  (Status:  New goal.) Long Term Goal Assessed patient's understanding of A1c goal: <7% Reviewed medications with patient and discussed importance of medication adherence Reviewed scheduled/upcoming provider appointments  Advised patient, providing education and rationale, to check cbg and record, calling provider for findings outside established parameters Review of patient status, including  review of consultants reports, relevant laboratory and other test results, and medications completed Assessed social determinant of health barriers Lab Results  Component Value Date   HGBA1C 7.1 (A) 08/27/2022  HGB A1C  6.6 on 01/08/23 HGB A1C  7.3 on 06/03/23  Hypertension Interventions:  (Status:  New goal.) Long Term Goal Last practice recorded BP readings:  BP Readings from Last 3 Encounters:  09/12/22 133/69  08/27/22 (!) 109/58  07/01/22 137/75  12/25/22          124/60 03/18/23         130/50 06/03/23         127/59  Most recent eGFR/CrCl:  Lab Results  Component Value Date   EGFR 90 06/04/2022    No components found for: "CRCL"  Evaluation of current treatment plan related to hypertension self management and patient's adherence to plan as established by provider Reviewed medications with patient and discussed importance of compliance Discussed plans with patient for ongoing care management follow up and provided patient with direct contact information for care management team Advised patient, providing education and rationale, to monitor blood pressure daily and record, calling PCP for findings outside established parameters Reviewed scheduled/upcoming provider appointments including:  Assessed social determinant of health barriers   Patient Goals/Self-Care Activities: Take all medications as prescribed Attend all scheduled provider appointments Call pharmacy for medication refills 3-7 days in advance of running out of medications Perform all self care activities independently  Perform IADL's (shopping,  preparing meals, housekeeping, managing finances) independently Call provider office for new concerns or questions  Work with the social worker to address care coordination needs and will continue to work with the clinical team to address health care and disease management related needs Patient to f/u with provider regarding sciatica pain-completed  Follow Up Plan:  The  patient has been provided with contact information for the care management team and has been advised to call with any health related questions or concerns.  The care management team will reach out to the patient again over the next 60 business  days.

## 2023-07-29 ENCOUNTER — Other Ambulatory Visit: Payer: Self-pay

## 2023-07-29 ENCOUNTER — Ambulatory Visit: Payer: Medicaid Other | Admitting: Orthopedic Surgery

## 2023-07-29 DIAGNOSIS — M25511 Pain in right shoulder: Secondary | ICD-10-CM

## 2023-07-30 ENCOUNTER — Encounter: Payer: Self-pay | Admitting: Orthopedic Surgery

## 2023-07-30 DIAGNOSIS — Z419 Encounter for procedure for purposes other than remedying health state, unspecified: Secondary | ICD-10-CM | POA: Diagnosis not present

## 2023-07-30 NOTE — Progress Notes (Addendum)
Office Visit Note   Patient: [name]           Date of Birth: 08-07-1961           MRN: [id number] Visit Date: 07/29/2023 Requested by: Philomena Doheny, MD [address] [place],  [place] [address] PCP: Colbert Coyer, Alyson Locket, MD  Subjective: Chief Complaint  Patient presents with   Right Shoulder - Pain    HPI: [name] is a 62 y.o. female who presents to the office reporting right shoulder pain.  She is done well with her left reverse shoulder replacement.  In addition to the reverse shoulder replacement on the left-hand side she underwent biceps tenodesis.  The right shoulder pain bothers her at night and with ADLs.  Has difficulty with overhead motion.  Also reports weakness in that right arm..  She does localize pain diffusely in the shoulder joint along with focally in the bicipital groove.              ROS: All systems reviewed are negative as they relate to the chief complaint within the history of present illness.  Patient denies fevers or chills.  Assessment & Plan: Visit Diagnoses:  1. Right shoulder pain, unspecified chronicity     Plan: Impression is right shoulder arthritis.  Has bone-on-bone changes.  Some weakness to supraspinatus testing.  Range of motion becoming slightly more restricted.  Tenderness in the bicipital groove radiating to the biceps as well.  Very similar to the left-hand side.  Had a long talk with Lanora Manis about operative and nonoperative treatment options.  She wants to hold off on nonoperative treatment and proceed with reverse shoulder replacement and biceps tenodesis..  Thin cut CT scan pending.  The risk and benefits of reverse shoulder replacement discussed with the patient including not limited to infection or vessel damage incomplete pain relief as well as incomplete restoration of function and instability.  Patient understands risk and benefits and wishes to proceed.  All questions answered.  Medical optimization  prior to surgery in progress.  Follow-Up Instructions: No follow-ups on file.   Orders:  Orders Placed This Encounter  Procedures   XR Shoulder Right   CT SHOULDER RIGHT WO CONTRAST   No orders of the defined types were placed in this encounter.     Procedures: No procedures performed   Clinical Data: No additional findings.  Objective: Vital Signs: LMP 02/03/2012   Physical Exam:  Constitutional: Patient appears well-developed HEENT:  Head: Normocephalic Eyes:EOM are normal Neck: Normal range of motion Cardiovascular: Normal rate Pulmonary/chest: Effort normal Neurologic: Patient is alert Skin: Skin is warm Psychiatric: Patient has normal mood and affect  Ortho Exam: Ortho exam demonstrates functional deltoid on the right.  Her range of motion is 30/80/120.  Actively she cannot really get the arm above 90 degrees of forward flexion and abduction.  Motor or sensory function to the hand is intact.  Radial pulses intact.  Skin intact in that right shoulder girdle region.  She has tenderness in the bicipital groove along with positive O'Brien's testing but no Popeye deformity is present.  Specialty Comments:  No specialty comments available.  Imaging: XR Shoulder Right  Result Date: 07/30/2023 AP axillary outlet radiographs right shoulder reviewed.  End-stage arthritis is present in the glenohumeral joint.  Acromiohumeral distance maintained.  Shoulder is located.  No acute fracture.    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   Healthcare maintenance 11/17/2021   Spondylolisthesis, lumbar region 02/11/2021    Class: Acute   Malignant neoplasm of upper-outer quadrant of right breast in female, estrogen receptor positive (HCC) 06/10/2018   Restless leg syndrome 02/04/2018   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   Depression 08/06/2015   Hypertension associated with diabetes (HCC)    Aortic stenosis s/p TAVR    Chronic diastolic congestive heart failure (HCC)    COPD (chronic obstructive pulmonary disease) (HCC) 09/21/2013   Obstructive sleep apnea 09/21/2013   Nephrolithiasis 09/21/2013   Nocturnal hypoxemia 09/12/2013   Hyperlipidemia 11/30/2012   History of pulmonary embolus (PE) 07/09/2012   Normocytic anemia 02/08/2012   Type 2 diabetes mellitus without complication, without long-term current use of insulin (HCC) 02/08/2012   Asthma 02/08/2012   GERD (gastroesophageal reflux disease)    CAD (coronary artery disease) 12/08/2011   Past Medical History:  Diagnosis Date   Anemia    Anxiety    Aortic valve stenosis, severe    Arthritis    PAIN AND SEVERE OA LEFT KNEE ; S/P RIGHT TKA ON 02/03/12; HAS LOWER BACK PAIN-UNABLE TO STAND MORE THAN 10 MIN; ARTHRITIS "ALL OVER"   Asthma    Blood transfusion    2013[facility]   Breast cancer in female [facility])    Right   CAD (coronary artery disease)    Cath 2010 with DES x 1 RCA-- PT'S CARDIOLOGIST IS DR. MCALHANY   Chronic diastolic congestive heart failure (HCC)    COPD (chronic obstructive pulmonary disease) (HCC)    Depression    Diabetes mellitus DIAGNOSED IN2010   Dyspnea    with much ambulation   Eczema    on back   Headache    migraines younger- rare now 02/07/21   Heart murmur    no current problems   History of hiatal hernia    History of kidney stones    passed or blasted   Hyperlipidemia    Hypertension    Morbid obesity  with body mass index of 60.0-69.9 in adult [facility])    Myocardial infarction (HCC)    PT THINKS SHE WAS DX WITH MI AT THE TIME OF HEART STENTING   Neuromuscular disorder (HCC)    bilateral arm/hands   Oxygen dependent    uses 3L oxygen night/prn   Personal history of radiation therapy    Pneumonia    Pulmonary embolism (HCC) 02/08/2012   S/P RT TOTAL KNEE ON 02/03/12--ON 02/08/12--DEVELOPED ACUTE SOB AND CHEST PAIN--AND DIAGNOSED WITH  PULMONARY EMBOLUS AND PNEUMONIA   Restless leg syndrome    Sleep apnea    uses 3 liters O2 at night as needed   Uterine fibroid    NO PROBLEMS AT PRESENT FROM THE FIBROIDS-STATES SHE IS POST MENOPAUSAL-LAST MENSES 2010 EXCEPT FOR EPISODE THIS YR OF BLEEDING RELATED TO FIBROIDS.   Weakness    BOTH HANDS - S/P BILATERAL CARPAL TUNNEL RELEASE--BUT STILL HAS WEAKNESS--OFTEN DROPS THINGS    Family History  Problem Relation Age of Onset   Breast cancer Mother  stage IV at diagnosis   Emphysema Mother        smoked   Heart disease Mother    COPD Father        smoked   Asthma Father    Heart disease Father    Cancer Brother        Sinus    Past Surgical History:  Procedure Laterality Date   BACK SURGERY  02/11/2021   Dr Otelia Sergeant   BREAST BIOPSY Right 06/04/2018   BREAST LUMPECTOMY Right 06/2018   BREAST LUMPECTOMY WITH RADIOACTIVE SEED AND SENTINEL LYMPH NODE BIOPSY Right 07/19/2018   Procedure: BREAST LUMPECTOMY WITH RADIOACTIVE SEED AND SENTINEL LYMPH NODE BIOPSY;  Surgeon: Ovidio Kin, MD;  Location: [facility] OR;  Service: General;  Laterality: Right;   CARDIAC CATHETERIZATION     CARDIAC VALVE REPLACEMENT     2017   CARPAL TUNNEL RELEASE     Bilateral   CHOLECYSTECTOMY     COLONOSCOPY     CORONARY ANGIOPLASTY     2010 has stent in place   CYSTOSCOPY W/ RETROGRADES Right 09/21/2013   Procedure: CYSTOSCOPY WITH RIGHT RETROGRADE PYELOGRAM RIGHT DOUBLE J STENT ;  Surgeon: Antony Haste, MD;  Location: [facility] ORS;  Service: Urology;   Laterality: Right;   CYSTOSCOPY W/ URETERAL STENT PLACEMENT Right 11/16/2022   Procedure: CYSTOSCOPY WITH RETROGRADE PYELOGRAM/URETERAL STENT PLACEMENT;  Surgeon: Despina Arias, MD;  Location: [facility] OR;  Service: Urology;  Laterality: Right;   CYSTOSCOPY WITH RETROGRADE PYELOGRAM, URETEROSCOPY AND STENT PLACEMENT Right 01/13/2023   Procedure: CYSTOSCOPY WITH RETROGRADE PYELOGRAM, URETEROSCOPY AND STENT EXCHANGE;  Surgeon: Milderd Meager., MD;  Location: [facility];  Service: Urology;  Laterality: Right;   CYSTOSCOPY WITH URETEROSCOPY AND STENT PLACEMENT Right 10/25/2013   Procedure: CYSTOSCOPY RIGHT URETEROSCOPY HOLMIUM LASER LITHO AND STENT PLACEMENT;  Surgeon: Antony Haste, MD;  Location: [facility];  Service: Urology;  Laterality: Right;   HERNIA REPAIR  1995   umbilical ,   HOLMIUM LASER APPLICATION Right 01/13/2023   Procedure: HOLMIUM LASER APPLICATION;  Surgeon: Milderd Meager., MD;  Location: [facility];  Service: Urology;  Laterality: Right;   INTRAOPERATIVE TRANSESOPHAGEAL ECHOCARDIOGRAM N/A 12/12/2014   Procedure: INTRAOPERATIVE TRANSESOPHAGEAL ECHOCARDIOGRAM;  Surgeon: Kathleene Hazel, MD;  Location: [facility] OR;  Service: Cardiovascular;  Laterality: N/A;   JOINT REPLACEMENT     bil total knees   KNEE ARTHROPLASTY  02/03/2012   Procedure: COMPUTER ASSISTED TOTAL KNEE ARTHROPLASTY;  Surgeon: [name], MD;  Location: [facility] OR;  Service: Orthopedics;  Laterality: Right;  Right total knee arthroplasty   LEFT AND RIGHT HEART CATHETERIZATION WITH CORONARY ANGIOGRAM N/A 03/17/2013   Procedure: LEFT AND RIGHT HEART CATHETERIZATION WITH CORONARY ANGIOGRAM;  Surgeon: Kathleene Hazel, MD;  Location: [facility] CATH LAB;  Service: Cardiovascular;  Laterality: N/A;   LEFT AND RIGHT HEART CATHETERIZATION WITH CORONARY/GRAFT ANGIOGRAM N/A 09/14/2014   Procedure: LEFT AND RIGHT HEART CATHETERIZATION WITH Isabel Caprice;  Surgeon: Kathleene Hazel, MD;  Location:  [facility] CATH LAB;  Service: Cardiovascular;  Laterality: N/A;   REVERSE SHOULDER ARTHROPLASTY Left 03/06/2022   Procedure: LEFT SHOULDER REPLACEMENT APPLICATION OF WOUND VAC;  Surgeon: Cammy Copa, MD;  Location: [facility] OR;  Service: Orthopedics;  Laterality: Left;   TEE WITHOUT CARDIOVERSION N/A 03/14/2013   Procedure: TRANSESOPHAGEAL ECHOCARDIOGRAM (TEE);  Surgeon: Lewayne Bunting, MD;  Location: [facility] ENDOSCOPY;  Service: Cardiovascular;  Laterality: N/A;   TEE WITHOUT CARDIOVERSION N/A 11/14/2014   Procedure: TRANSESOPHAGEAL ECHOCARDIOGRAM (TEE);  Surgeon:  Lewayne Bunting, MD;  Location: [facility] ENDOSCOPY;  Service: Cardiovascular;  Laterality: N/A;   TONSILLECTOMY     maybe as a child- does not know   TOTAL KNEE ARTHROPLASTY  09/10/2012   Procedure: TOTAL KNEE ARTHROPLASTY;  Surgeon: [name], MD;  Location: [facility];  Service: Orthopedics;  Laterality: Left;   TOTAL KNEE REVISION Right 07/15/2013   Procedure: REVISION ARTHROPLASTY RIGHT KNEE;  Surgeon: [name], MD;  Location: [facility];  Service: Orthopedics;  Laterality: Right;   TRANSCATHETER AORTIC VALVE REPLACEMENT, TRANSFEMORAL N/A 12/12/2014   Procedure: TRANSCATHETER AORTIC VALVE REPLACEMENT, TRANSFEMORAL;  Surgeon: Kathleene Hazel, MD;  Location: [facility] OR;  Service: Cardiovascular;  Laterality: N/A;   TRIGGER FINGER RELEASE  09/10/2012   Procedure: RELEASE TRIGGER FINGER/A-1 PULLEY;  Surgeon: [name], MD;  Location: [facility];  Service: Orthopedics;  Laterality: Right;  Right Ring Finger   TUBAL LIGATION     Social History   Occupational History   Occupation: Disabled  Tobacco Use   Smoking status: Former    Current packs/day: 0.00    Average packs/day: 1.5 packs/day for 30.0 years (45.0 ttl pk-yrs)    Types: Cigarettes    Start date: 12/29/1970    Quit date: 12/29/2000    Years since quitting: 22.5   Smokeless tobacco: Never  Vaping Use   Vaping status: Never Used  Substance and Sexual  Activity   Alcohol use: Not Currently   Drug use: Not Currently    Types: Marijuana    Comment: CBD oils through vape shops   Sexual activity: Not Currently    Birth control/protection: Surgical, Post-menopausal    Comment: tubal ligation

## 2023-08-03 ENCOUNTER — Other Ambulatory Visit: Payer: Self-pay

## 2023-08-03 DIAGNOSIS — J441 Chronic obstructive pulmonary disease with (acute) exacerbation: Secondary | ICD-10-CM

## 2023-08-04 ENCOUNTER — Other Ambulatory Visit: Payer: Self-pay

## 2023-08-04 DIAGNOSIS — J441 Chronic obstructive pulmonary disease with (acute) exacerbation: Secondary | ICD-10-CM

## 2023-08-04 NOTE — Telephone Encounter (Signed)
Next appt scheduled 8/20 with PCP. 

## 2023-08-05 ENCOUNTER — Other Ambulatory Visit: Payer: Self-pay | Admitting: Student

## 2023-08-05 ENCOUNTER — Other Ambulatory Visit: Payer: Self-pay

## 2023-08-05 DIAGNOSIS — J441 Chronic obstructive pulmonary disease with (acute) exacerbation: Secondary | ICD-10-CM

## 2023-08-05 DIAGNOSIS — I152 Hypertension secondary to endocrine disorders: Secondary | ICD-10-CM

## 2023-08-05 MED ORDER — VENTOLIN HFA 108 (90 BASE) MCG/ACT IN AERS
2.0000 | INHALATION_SPRAY | Freq: Four times a day (QID) | RESPIRATORY_TRACT | 3 refills | Status: DC | PRN
Start: 2023-08-05 — End: 2024-05-16

## 2023-08-05 MED ORDER — LOSARTAN POTASSIUM-HCTZ 100-25 MG PO TABS
1.0000 | ORAL_TABLET | Freq: Every day | ORAL | 0 refills | Status: DC
Start: 2023-08-05 — End: 2023-09-14

## 2023-08-05 MED ORDER — TRELEGY ELLIPTA 100-62.5-25 MCG/ACT IN AEPB
INHALATION_SPRAY | RESPIRATORY_TRACT | 0 refills | Status: DC
Start: 2023-08-05 — End: 2023-11-09

## 2023-08-11 ENCOUNTER — Ambulatory Visit: Payer: Medicaid Other | Admitting: Gastroenterology

## 2023-08-11 ENCOUNTER — Encounter: Payer: Self-pay | Admitting: Gastroenterology

## 2023-08-11 VITALS — BP 126/78 | HR 88 | Ht 63.0 in | Wt 301.0 lb

## 2023-08-11 DIAGNOSIS — K30 Functional dyspepsia: Secondary | ICD-10-CM

## 2023-08-11 DIAGNOSIS — K5909 Other constipation: Secondary | ICD-10-CM | POA: Diagnosis not present

## 2023-08-11 DIAGNOSIS — K219 Gastro-esophageal reflux disease without esophagitis: Secondary | ICD-10-CM

## 2023-08-11 DIAGNOSIS — Z8639 Personal history of other endocrine, nutritional and metabolic disease: Secondary | ICD-10-CM | POA: Diagnosis not present

## 2023-08-11 DIAGNOSIS — Z1211 Encounter for screening for malignant neoplasm of colon: Secondary | ICD-10-CM

## 2023-08-11 DIAGNOSIS — Z7902 Long term (current) use of antithrombotics/antiplatelets: Secondary | ICD-10-CM | POA: Diagnosis not present

## 2023-08-11 NOTE — Progress Notes (Signed)
GASTROENTEROLOGY OUTPATIENT CLINIC VISIT   Primary Care Provider Colbert Coyer, Alyson Locket, MD [address] [place] [address] [phone]  Referring Provider Colbert Coyer, Alyson Locket, MD [address] [place],  [place] [address] [phone]  Patient Profile: [name] is a 62 y.o. female with a pmh significant for CAD (status post prior PCI on Plavix), status post TAVR, CHFpEF, COPD, OSA (nocturnal O2), morbid obesity, hypertension, hyperlipidemia, diabetes, prior nephrolithiasis, status postcholecystectomy.  The patient presents to the [facility] for an evaluation and management of problem(s) noted below:  Problem List 1. Colon cancer screening   2. Acid indigestion   3. Chronic constipation   4. History of iron deficiency   5. Morbid obesity (HCC)   6. Antiplatelet or antithrombotic long-term use     History of Present Illness This is the patient's first visit to the outpatient [facility].  She has a history of chronic constipation.  She uses dietary fiber to help but never supplements.  She can go a few days without a bowel movement and then her bowels will start moving.  She uses laxatives rarely.  Patient does experience infrequent episodes of indigestion for which she may take Pepto-Bismol once or twice a week.  She denies any dysphagia symptoms.  She previously has had a history of iron deficiency has not had labs in a while.  She takes oral iron.  She is interested in colon cancer screening and colonoscopy.  She has never had an upper endoscopy.  She denies any blood in her stools.  GI Review of Systems Positive as above Negative for dysphagia, odynophagia, nausea, vomiting, abdominal pain, alteration of bowel habits  Review of Systems General: Denies fevers/chills/weight loss unintentionally Cardiovascular: Denies chest pain Pulmonary: Denies progressive shortness of breath Gastroenterological: See HPI Genitourinary: Denies  darkened urine or hematuria Hematological: Positive for history of easy bruising/bleeding Endocrine: Denies temperature intolerance Dermatological: Denies jaundice Psychological: Mood is stable   Medications Current Outpatient Medications  Medication Sig Dispense Refill   acetaminophen (TYLENOL) 500 MG tablet Take 1,000 mg by mouth every 6 (six) hours as needed for moderate pain.     amLODipine (NORVASC) 5 MG tablet Take 1 tablet (5 mg total) by mouth daily. 90 tablet 2   atorvastatin (LIPITOR) 80 MG tablet Take 1 tablet (80 mg total) by mouth daily. 90 tablet 2   busPIRone (BUSPAR) 15 MG tablet Take 1 tablet (15 mg total) by mouth 2 (two) times daily. 60 tablet 3   cholecalciferol (VITAMIN D3) 25 MCG (1000 UNIT) tablet Take 1,000 Units by mouth daily.     clopidogrel (PLAVIX) 75 MG tablet TAKE 1 TABLET BY MOUTH IN THE MORNING 90 tablet 2   Continuous Glucose Sensor (FREESTYLE LIBRE 3 SENSOR) MISC Place 1 sensor on the skin every 14 days. Use to check glucose continuously 2 each 11   Continuous Glucose Sensor (FREESTYLE LIBRE 3 SENSOR) MISC PLACE 1 SENSOR ON THE SKIN EVERY 14 DAYS TO CHECK GLUCOSE CONTINUOUSLY 2 each 11   DULoxetine (CYMBALTA) 20 MG capsule Take 1 capsule (20 mg total) by mouth daily. 30 capsule 3   Evolocumab (REPATHA SURECLICK) 140 MG/ML SOAJ INJECT 1 PEN INTO THE SKIN EVERY 14 DAYS 6 mL 3   exemestane (AROMASIN) 25 MG tablet TAKE 1 TABLET BY MOUTH ONCE DAILY AFTER BREAKFAST 90 tablet 3   ezetimibe (ZETIA) 10 MG tablet Take 1 tablet by mouth once daily 90 tablet 2   Fluticasone-Umeclidin-Vilant (TRELEGY ELLIPTA) 100-62.5-25 MCG/ACT  AEPB INHALE 1 PUFF ONCE DAILY 180 each 0   furosemide (LASIX) 40 MG tablet Take 1 tablet (40 mg total) by mouth as needed for fluid or edema. 180 tablet 1   gabapentin (NEURONTIN) 300 MG capsule TAKE 2 CAPSULES BY MOUTH IN THE MORNING AND 3 AT BEDTIME 150 capsule 3   hydrocortisone cream 1 % Apply 1 Application topically daily as needed for  itching.     losartan-hydrochlorothiazide (HYZAAR) 100-25 MG tablet Take 1 tablet by mouth daily. Please repeat kidney labs to get additional refills 30 tablet 0   metFORMIN (GLUCOPHAGE-XR) 500 MG 24 hr tablet Take 1 tablet by mouth twice daily 180 tablet 0   metoprolol succinate (TOPROL-XL) 50 MG 24 hr tablet TAKE 1 TABLET BY MOUTH ONCE DAILY WITH MEALS OR  IMMEDIATELY  FOLLOWING  A  MEAL 90 tablet 2   montelukast (SINGULAIR) 10 MG tablet TAKE 1 TABLET BY MOUTH AT BEDTIME 90 tablet 1   Semaglutide, 1 MG/DOSE, 4 MG/3ML SOPN Inject 1 mg into the skin once a week. 3 mL 0   senna-docusate (SENNA-TIME S) 8.6-50 MG tablet Take 1 tablet by mouth at bedtime as needed for mild constipation. 30 tablet 1   traZODone (DESYREL) 50 MG tablet Take 1-2 tablets (50-100 mg total) by mouth at bedtime. 30 tablet 3   VENTOLIN HFA 108 (90 Base) MCG/ACT inhaler Inhale 2 puffs into the lungs every 6 (six) hours as needed for wheezing or shortness of breath. 18 g 3   ferrous sulfate 325 (65 FE) MG tablet Take 1 tablet (325 mg total) by mouth every other day. (Patient not taking: Reported on 05/21/2023) 30 tablet 3   No current facility-administered medications for this visit.    Allergies No Known Allergies  Histories Past Medical History:  Diagnosis Date   Anemia    Anxiety    Aortic valve stenosis, severe    Arthritis    PAIN AND SEVERE OA LEFT KNEE ; S/P RIGHT TKA ON 02/03/12; HAS LOWER BACK PAIN-UNABLE TO STAND MORE THAN 10 MIN; ARTHRITIS "ALL OVER"   Asthma    Blood transfusion    [facility]   Breast cancer in female [facility])    Right   CAD (coronary artery disease)    Cath 2010 with DES x 1 RCA-- PT'S CARDIOLOGIST IS DR. MCALHANY   Chronic diastolic congestive heart failure (HCC)    COPD (chronic obstructive pulmonary disease) (HCC)    Depression    Diabetes mellitus DIAGNOSED IN2010   Dyspnea    with much ambulation   Eczema    on back   Headache    migraines younger- rare now 02/07/21   Heart  murmur    no current problems   History of hiatal hernia    History of kidney stones    passed or blasted   Hyperlipidemia    Hypertension    Morbid obesity with body mass index of 60.0-69.9 in adult [facility])    Myocardial infarction (HCC)    PT THINKS SHE WAS DX WITH MI AT THE TIME OF HEART STENTING   Neuromuscular disorder (HCC)    bilateral arm/hands   Oxygen dependent    uses 3L oxygen night/prn   Personal history of radiation therapy    Pneumonia    Pulmonary embolism (HCC) 02/08/2012   S/P RT TOTAL KNEE ON 02/03/12--ON 02/08/12--DEVELOPED ACUTE SOB AND CHEST PAIN--AND DIAGNOSED WITH  PULMONARY EMBOLUS AND PNEUMONIA   Restless leg syndrome    Sleep apnea  uses 3 liters O2 at night as needed   Uterine fibroid    NO PROBLEMS AT PRESENT FROM THE FIBROIDS-STATES SHE IS POST MENOPAUSAL-LAST MENSES 2010 EXCEPT FOR EPISODE THIS YR OF BLEEDING RELATED TO FIBROIDS.   Weakness    BOTH HANDS - S/P BILATERAL CARPAL TUNNEL RELEASE--BUT STILL HAS WEAKNESS--OFTEN DROPS THINGS   Past Surgical History:  Procedure Laterality Date   BACK SURGERY  02/11/2021   Dr Otelia Sergeant   BREAST BIOPSY Right 06/04/2018   BREAST LUMPECTOMY Right 06/2018   BREAST LUMPECTOMY WITH RADIOACTIVE SEED AND SENTINEL LYMPH NODE BIOPSY Right 07/19/2018   Procedure: BREAST LUMPECTOMY WITH RADIOACTIVE SEED AND SENTINEL LYMPH NODE BIOPSY;  Surgeon: Ovidio Kin, MD;  Location: [facility] OR;  Service: General;  Laterality: Right;   CARDIAC CATHETERIZATION     CARDIAC VALVE REPLACEMENT     2017   CARPAL TUNNEL RELEASE     Bilateral   CHOLECYSTECTOMY     COLONOSCOPY     CORONARY ANGIOPLASTY     2010 has stent in place   CYSTOSCOPY W/ RETROGRADES Right 09/21/2013   Procedure: CYSTOSCOPY WITH RIGHT RETROGRADE PYELOGRAM RIGHT DOUBLE J STENT ;  Surgeon: Antony Haste, MD;  Location: [facility] ORS;  Service: Urology;  Laterality: Right;   CYSTOSCOPY W/ URETERAL STENT PLACEMENT Right 11/16/2022   Procedure: CYSTOSCOPY WITH RETROGRADE  PYELOGRAM/URETERAL STENT PLACEMENT;  Surgeon: Despina Arias, MD;  Location: [facility] OR;  Service: Urology;  Laterality: Right;   CYSTOSCOPY WITH RETROGRADE PYELOGRAM, URETEROSCOPY AND STENT PLACEMENT Right 01/13/2023   Procedure: CYSTOSCOPY WITH RETROGRADE PYELOGRAM, URETEROSCOPY AND STENT EXCHANGE;  Surgeon: Milderd Meager., MD;  Location: [facility];  Service: Urology;  Laterality: Right;   CYSTOSCOPY WITH URETEROSCOPY AND STENT PLACEMENT Right 10/25/2013   Procedure: CYSTOSCOPY RIGHT URETEROSCOPY HOLMIUM LASER LITHO AND STENT PLACEMENT;  Surgeon: Antony Haste, MD;  Location: [facility];  Service: Urology;  Laterality: Right;   HERNIA REPAIR  1995   umbilical ,   HOLMIUM LASER APPLICATION Right 01/13/2023   Procedure: HOLMIUM LASER APPLICATION;  Surgeon: Milderd Meager., MD;  Location: [facility];  Service: Urology;  Laterality: Right;   INTRAOPERATIVE TRANSESOPHAGEAL ECHOCARDIOGRAM N/A 12/12/2014   Procedure: INTRAOPERATIVE TRANSESOPHAGEAL ECHOCARDIOGRAM;  Surgeon: Kathleene Hazel, MD;  Location: [facility] OR;  Service: Cardiovascular;  Laterality: N/A;   JOINT REPLACEMENT     bil total knees   KNEE ARTHROPLASTY  02/03/2012   Procedure: COMPUTER ASSISTED TOTAL KNEE ARTHROPLASTY;  Surgeon: [name], MD;  [facility];  Service: Orthopedics;  Laterality: Right;  Right total knee arthroplasty   LEFT AND RIGHT HEART CATHETERIZATION WITH CORONARY ANGIOGRAM N/A 03/17/2013   Procedure: LEFT AND RIGHT HEART CATHETERIZATION WITH CORONARY ANGIOGRAM;  Surgeon: Kathleene Hazel, MD;  Location: [facility] CATH LAB;  Service: Cardiovascular;  Laterality: N/A;   LEFT AND RIGHT HEART CATHETERIZATION WITH CORONARY/GRAFT ANGIOGRAM N/A 09/14/2014   Procedure: LEFT AND RIGHT HEART CATHETERIZATION WITH Isabel Caprice;  Surgeon: Kathleene Hazel, MD;  Location: [facility] CATH LAB;  Service: Cardiovascular;  Laterality: N/A;   REVERSE SHOULDER ARTHROPLASTY Left 03/06/2022   Procedure:  LEFT SHOULDER REPLACEMENT APPLICATION OF WOUND VAC;  Surgeon: Cammy Copa, MD;  Location: [facility];  Service: Orthopedics;  Laterality: Left;   TEE WITHOUT CARDIOVERSION N/A 03/14/2013   Procedure: TRANSESOPHAGEAL ECHOCARDIOGRAM (TEE);  Surgeon: Lewayne Bunting, MD;  Location: [facility] ENDOSCOPY;  Service: Cardiovascular;  Laterality: N/A;   TEE WITHOUT CARDIOVERSION N/A 11/14/2014   Procedure: TRANSESOPHAGEAL ECHOCARDIOGRAM (TEE);  Surgeon: Arlys John  Ludwig Clarks, MD;  [facility]: [facility] ENDOSCOPY;  Service: Cardiovascular;  Laterality: N/A;   TONSILLECTOMY     maybe as a child- does not know   TOTAL KNEE ARTHROPLASTY  09/10/2012   Procedure: TOTAL KNEE ARTHROPLASTY;  Surgeon: [name], MD;  [facility]: [facility];  Service: Orthopedics;  Laterality: Left;   TOTAL KNEE REVISION Right 07/15/2013   Procedure: REVISION ARTHROPLASTY RIGHT KNEE;  Surgeon: [name], MD;  [facility]: [facility];  Service: Orthopedics;  Laterality: Right;   TRANSCATHETER AORTIC VALVE REPLACEMENT, TRANSFEMORAL N/A 12/12/2014   Procedure: TRANSCATHETER AORTIC VALVE REPLACEMENT, TRANSFEMORAL;  Surgeon: Kathleene Hazel, MD;  [facility]: [facility];  Service: Cardiovascular;  Laterality: N/A;   TRIGGER FINGER RELEASE  09/10/2012   Procedure: RELEASE TRIGGER FINGER/A-1 PULLEY;  Surgeon: [name], MD;  Location: [facility];  Service: Orthopedics;  Laterality: Right;  Right Ring Finger   TUBAL LIGATION     Social History   Socioeconomic History   Marital status: Married    Spouse name: Not on file   Number of children: 2   Years of education: Not on file   Highest education level: Not on file  Occupational History   Occupation: Disabled  Tobacco Use   Smoking status: Former    Current packs/day: 0.00    Average packs/day: 1.5 packs/day for 30.0 years (45.0 ttl pk-yrs)    Types: Cigarettes    Start date: 12/29/1970    Quit date: 12/29/2000    Years since quitting: 22.6   Smokeless tobacco: Never  Vaping  Use   Vaping status: Never Used  Substance and Sexual Activity   Alcohol use: Not Currently   Drug use: Not Currently    Types: Marijuana    Comment: CBD oils through vape shops   Sexual activity: Not Currently    Birth control/protection: Surgical, Post-menopausal    Comment: tubal ligation  Other Topics Concern   Not on file  Social History Narrative   Not on file   Social Determinants of Health   Financial Resource Strain: Low Risk  (05/12/2023)   Overall Financial Resource Strain (CARDIA)    Difficulty of Paying Living Expenses: Not very hard  Food Insecurity: No Food Insecurity (06/16/2023)   Hunger Vital Sign    Worried About Running Out of Food in the Last Year: Never true    Ran Out of Food in the Last Year: Never true  Transportation Needs: No Transportation Needs (03/09/2023)   PRAPARE - Administrator, Civil Service (Medical): No    Lack of Transportation (Non-Medical): No  Physical Activity: Inactive (07/21/2023)   Exercise Vital Sign    Days of Exercise per Week: 0 days    Minutes of Exercise per Session: 0 min  Stress: No Stress Concern Present (07/07/2023)   [employer] of Occupational Health - Occupational Stress Questionnaire    Feeling of Stress : Only a little  Recent Concern: Stress - Stress Concern Present (05/05/2023)   [employer] of Occupational Health - Occupational Stress Questionnaire    Feeling of Stress : To some extent  Social Connections: Moderately Isolated (05/12/2023)   Social Connection and Isolation Panel [NHANES]    Frequency of Communication with Friends and Family: More than three times a week    Frequency of Social Gatherings with Friends and Family: More than three times a week    Attends Religious Services: Never    Database administrator or Organizations: No    Attends  Club or Organization Meetings: Never    Marital Status: Married  Catering manager Violence: Not At Risk (04/01/2023)   Humiliation, Afraid, Rape, and  Kick questionnaire    Fear of Current or Ex-Partner: No    Emotionally Abused: No    Physically Abused: No    Sexually Abused: No   Family History  Problem Relation Age of Onset   Breast cancer Mother        stage IV at diagnosis   Emphysema Mother        smoked   Heart disease Mother    COPD Father        smoked   Asthma Father    Heart disease Father    Cancer Brother        Sinus   Colon cancer Neg Hx    Esophageal cancer Neg Hx    Inflammatory bowel disease Neg Hx    Liver disease Neg Hx    Pancreatic cancer Neg Hx    Rectal cancer Neg Hx    Stomach cancer Neg Hx    I have reviewed her medical, social, and family history in detail and updated the electronic medical record as necessary.    PHYSICAL EXAMINATION  BP 126/78   Pulse 88   Ht 5\' 3"  (1.6 m)   Wt (!) 301 lb (136.5 kg)   LMP 02/03/2012   BMI 53.32 kg/m  Wt Readings from Last 3 Encounters:  08/11/23 (!) 301 lb (136.5 kg)  06/03/23 (!) 303 lb 14.4 oz (137.8 kg)  05/20/23 (!) 302 lb 12.8 oz (137.3 kg)  GEN: NAD, appears stated age, doesn't appear chronically ill PSYCH: Cooperative, without pressured speech EYE: Conjunctivae pink, sclerae anicteric ENT: MMM, CV: Nontachycardic RESP: No audible wheezing GI: NABS, soft, protuberant abdomen, rounded, nontender, no rebound or guarding  MSK/EXT: Lower extremity edema noted bilaterally SKIN: No jaundice NEURO:  Alert & Oriented x 3, no focal deficits   REVIEW OF DATA  I reviewed the following data at the time of this encounter:  GI Procedures and Studies  No relevant studies to review  Laboratory Studies  Reviewed those in epic  Imaging Studies  November 2023 CT chest abdomen pelvis IMPRESSION: 1. Obstructing 4 mm right ureteropelvic junction stone with associated mild right hydronephrosis. 2. Punctate nonobstructing left nephrolithiasis. No left hydronephrosis. 3. Subtle mosaic attenuation of the bilateral lungs, likely due to air trapping,  which could reflect small airways disease. No pulmonary edema or pleural effusion. 4. Anterior abdominal mesh hernia repair with moderate-sized fat-containing ventral midline abdominal hernias along the superior margin of the hernia mesh repair and two additional fat-containing midline abdominal hernias inferior to the mesh repair, 1 small, 1 moderate. 5. Coronary artery calcifications. Aortic Atherosclerosis (ICD10-I70.0).   ASSESSMENT  Ms. Guibord is a 62 y.o. femalewith a pmh significant for CAD (status post prior PCI on Plavix), status post TAVR, CHFpEF, COPD, OSA (nocturnal O2), morbid obesity, hypertension, hyperlipidemia, diabetes, prior nephrolithiasis, status postcholecystectomy.  The patient is seen today for evaluation and management of:  1. Colon cancer screening   2. Acid indigestion   3. Chronic constipation   4. History of iron deficiency   5. Morbid obesity (HCC)   6. Antiplatelet or antithrombotic long-term use    The patient is hemodynamically stable.  From a GI perspective, she is also stable.  She has chronic constipation which is really unchanged for her.  I think she would benefit from initiation of fiber supplementation regularly as well as  laxative therapy regularly on a daily versus as needed basis.  With this being said she is due for colon cancer screening.  Although she has multiple comorbidities, I do think she still remains an adequate candidate for consideration of colonoscopy for colon cancer screening.  We did discuss other modalities such as Cologuard due to her being average risk.  As the patient has a history of iron deficiency in the past, I think if we still see evidence of iron deficiency it may make sense for [place] to just go ahead and move forward with an endoscopic evaluation.  If her iron deficiency is no longer present at any percentage, then we could consider Cologuard testing for her.  Her acid indigestion symptoms are mild and she uses Pepto-Bismol  without PPI show not overtly concerned about needing to pursue an upper endoscopy if she has no evidence of iron deficiency but I want to keep this in mind as well.  Her procedures will need to be done in the hospital-based setting due to her BMI, which I do not think precludes her from having this completed, if needed.  She is going to think about whether she wants to just go forward with colonoscopy or consider Cologuard.  She is going to see seeing her PCP in the coming weeks and so I put iron indices and laboratories as pended orders to be pursued.  She has an upcoming surgery on her right shoulder as I do not think this will preclude the chance of doing endoscopic evaluation if needed in the future.  Time will tell.  All patient questions were answered to the best of my ability, and the patient agrees to the aforementioned plan of action with follow-up as indicated.   PLAN  Laboratories as outlined below If patient has evidence of iron deficiency, will strongly recommend endoscopic evaluation (upper and lower) If no evidence of iron deficiency, then Cologuard could be pursued by patient if she desires otherwise could still move forward with colonoscopy in hospital-based setting FiberCon or fiber supplementation daily recommended Laxative therapy with MiraLAX or Dulcolax as needed Toileting techniques discussed Patient will let us know in a few weeks which she has decided   Orders Placed This Encounter  Procedures   CBC   Comp Met (CMET)   IBC + Ferritin    New Prescriptions   No medications on file   Modified Medications   No medications on file    Planned Follow Up No follow-ups on file.   Total Time in Face-to-Face and in Coordination of Care for patient including independent/personal interpretation/review of prior testing, medical history, examination, medication adjustment, communicating results with the patient directly, and documentation within the EHR is 45  minutes.   Corliss Parish, MD [facility] Advanced Endoscopy Office # [phone]

## 2023-08-11 NOTE — Patient Instructions (Addendum)
Please send my chart message in 4 weeks to let us know if you have decided on Colonoscopy or Cologuard.   Toileting tips to help with your constipation - Drink at least 64-80 ounces of water/liquid per day. - Establish a time to try to move your bowels every day.  For many people, this is after a cup of coffee or after a meal such as breakfast. - Sit all of the way back on the toilet keeping your back fairly straight and while sitting up, try to rest the tops of your forearms on your upper thighs.   - Raising your feet with a step stool/squatty potty can be helpful to improve the angle that allows your stool to pass through the rectum. - Relax the rectum feeling it bulge toward the toilet water.  If you feel your rectum raising toward your body, you are contracting rather than relaxing. - Breathe in and slowly exhale. "Belly breath" by expanding your belly towards your belly button. Keep belly expanded as you gently direct pressure down and back to the anus.  A low pitched GRRR sound can assist with increasing intra-abdominal pressure.  - Repeat 3-4 times. If unsuccessful, contract the pelvic floor to restore normal tone and get off the toilet.  Avoid excessive straining. - To reduce excessive wiping by teaching your anus to normally contract, place hands on outer aspect of knees and resist knee movement outward.  Hold 5-10 second then place hands just inside of knees and resist inward movement of knees.  Hold 5 seconds.  Repeat a few times each way.  _______________________________  If your blood pressure at your visit was 140/90 or greater, please contact your primary care physician to follow up on this.  _______________________________________________________  If you are age 72 or older, your body mass index should be between 23-30. Your Body mass index is 53.32 kg/m. If this is out of the aforementioned range listed, please consider follow up with your Primary Care Provider.  If you are age  41 or younger, your body mass index should be between 19-25. Your Body mass index is 53.32 kg/m. If this is out of the aformentioned range listed, please consider follow up with your Primary Care Provider.   ________________________________________________________  The [facility] providers would like to encourage you to use [facility] to communicate with providers for non-urgent requests or questions.  Due to long hold times on the telephone, sending your provider a message by [facility] may be a faster and more efficient way to get a response.  Please allow 48 business hours for a response.  Please remember that this is for non-urgent requests.  _______________________________________________________  Thank you for choosing me and [facility].  Dr. Meridee Score

## 2023-08-12 ENCOUNTER — Ambulatory Visit
Admission: RE | Admit: 2023-08-12 | Discharge: 2023-08-12 | Disposition: A | Discharge status: Home / Self Care | Payer: Medicaid Other | Source: Ambulatory Visit | Attending: Orthopedic Surgery | Admitting: Orthopedic Surgery

## 2023-08-12 DIAGNOSIS — M19211 Secondary osteoarthritis, right shoulder: Secondary | ICD-10-CM | POA: Diagnosis not present

## 2023-08-12 DIAGNOSIS — M25511 Pain in right shoulder: Secondary | ICD-10-CM

## 2023-08-13 DIAGNOSIS — H02831 Dermatochalasis of right upper eyelid: Secondary | ICD-10-CM | POA: Diagnosis not present

## 2023-08-13 DIAGNOSIS — H04123 Dry eye syndrome of bilateral lacrimal glands: Secondary | ICD-10-CM | POA: Diagnosis not present

## 2023-08-13 DIAGNOSIS — H2513 Age-related nuclear cataract, bilateral: Secondary | ICD-10-CM | POA: Diagnosis not present

## 2023-08-13 DIAGNOSIS — H0102A Squamous blepharitis right eye, upper and lower eyelids: Secondary | ICD-10-CM | POA: Diagnosis not present

## 2023-08-13 DIAGNOSIS — H02834 Dermatochalasis of left upper eyelid: Secondary | ICD-10-CM | POA: Diagnosis not present

## 2023-08-13 DIAGNOSIS — H0102B Squamous blepharitis left eye, upper and lower eyelids: Secondary | ICD-10-CM | POA: Diagnosis not present

## 2023-08-15 ENCOUNTER — Encounter: Payer: Self-pay | Admitting: Gastroenterology

## 2023-08-15 DIAGNOSIS — K5909 Other constipation: Secondary | ICD-10-CM | POA: Insufficient documentation

## 2023-08-15 DIAGNOSIS — Z7902 Long term (current) use of antithrombotics/antiplatelets: Secondary | ICD-10-CM | POA: Insufficient documentation

## 2023-08-15 DIAGNOSIS — Z8639 Personal history of other endocrine, nutritional and metabolic disease: Secondary | ICD-10-CM | POA: Insufficient documentation

## 2023-08-16 ENCOUNTER — Other Ambulatory Visit: Payer: Self-pay | Admitting: Internal Medicine

## 2023-08-16 DIAGNOSIS — E119 Type 2 diabetes mellitus without complications: Secondary | ICD-10-CM

## 2023-08-18 ENCOUNTER — Ambulatory Visit (INDEPENDENT_AMBULATORY_CARE_PROVIDER_SITE_OTHER): Payer: Medicaid Other | Admitting: Student

## 2023-08-18 VITALS — BP 153/74 | HR 86 | Temp 98.6°F | Ht <= 58 in | Wt 300.1 lb

## 2023-08-18 DIAGNOSIS — Z7984 Long term (current) use of oral hypoglycemic drugs: Secondary | ICD-10-CM | POA: Diagnosis not present

## 2023-08-18 DIAGNOSIS — I152 Hypertension secondary to endocrine disorders: Secondary | ICD-10-CM

## 2023-08-18 DIAGNOSIS — E119 Type 2 diabetes mellitus without complications: Secondary | ICD-10-CM | POA: Diagnosis not present

## 2023-08-18 DIAGNOSIS — E1159 Type 2 diabetes mellitus with other circulatory complications: Secondary | ICD-10-CM | POA: Diagnosis not present

## 2023-08-18 DIAGNOSIS — E785 Hyperlipidemia, unspecified: Secondary | ICD-10-CM

## 2023-08-18 DIAGNOSIS — I2510 Atherosclerotic heart disease of native coronary artery without angina pectoris: Secondary | ICD-10-CM | POA: Diagnosis not present

## 2023-08-18 DIAGNOSIS — Z87891 Personal history of nicotine dependence: Secondary | ICD-10-CM | POA: Diagnosis not present

## 2023-08-18 DIAGNOSIS — Z862 Personal history of diseases of the blood and blood-forming organs and certain disorders involving the immune mechanism: Secondary | ICD-10-CM

## 2023-08-18 LAB — POCT GLYCOSYLATED HEMOGLOBIN (HGB A1C): Hemoglobin A1C: 7 % — AB (ref 4.0–5.6)

## 2023-08-18 LAB — GLUCOSE, CAPILLARY: Glucose-Capillary: 151 mg/dL — ABNORMAL HIGH (ref 70–99)

## 2023-08-18 MED ORDER — REPATHA SURECLICK 140 MG/ML ~~LOC~~ SOAJ
140.0000 mg | SUBCUTANEOUS | 3 refills | Status: DC
Start: 2023-08-18 — End: 2023-08-18

## 2023-08-18 MED ORDER — METFORMIN HCL ER 500 MG PO TB24: 500.0000 mg | ORAL_TABLET | Freq: Two times a day (BID) | ORAL | 0 refills | Status: DC

## 2023-08-18 MED ORDER — FREESTYLE LIBRE 3 SENSOR MISC
11 refills | Status: DC
Start: 2023-08-18 — End: 2024-01-29

## 2023-08-18 MED ORDER — FREESTYLE LIBRE 3 SENSOR MISC
11 refills | Status: DC
Start: 2023-08-18 — End: 2023-08-18

## 2023-08-18 MED ORDER — SEMAGLUTIDE (2 MG/DOSE) 8 MG/3ML ~~LOC~~ SOPN
2.0000 mg | PEN_INJECTOR | SUBCUTANEOUS | 1 refills | Status: DC
Start: 2023-08-18 — End: 2023-12-01

## 2023-08-18 MED ORDER — METFORMIN HCL ER 500 MG PO TB24
500.0000 mg | ORAL_TABLET | Freq: Two times a day (BID) | ORAL | 0 refills | Status: DC
Start: 2023-08-18 — End: 2024-01-21

## 2023-08-18 MED ORDER — REPATHA SURECLICK 140 MG/ML ~~LOC~~ SOAJ
140.0000 mg | SUBCUTANEOUS | 3 refills | Status: DC
Start: 2023-08-18 — End: 2024-10-10

## 2023-08-18 NOTE — Assessment & Plan Note (Signed)
Patient tolerating Ozempic 1 mg weekly injections, Metformin 500 BID, Gabapentin 300 mg (3 at night, 1 in the morning). Tolerating all well. A1C 7.3% in 06/03/23. Diabetic eye exam is up to date, follows with Dr. Dione Booze. Foot exam unremarkable today. Patient's current weight 300 lb 1.6 oz. Patient wants to continue making progress with [facility] and A1C goal, will increase Ozempic to Ozempic 2 mg weekly. Patient advised to call if she has adverse effects with increased dose. Will also reorder Freestyle Libre CGM monitor for patient today.

## 2023-08-18 NOTE — Progress Notes (Signed)
Established Patient Office Visit  Subjective   Patient ID: [name], female    DOB: 02/11/1961  Age: 62 y.o. MRN: [id number]  Chief Complaint  Patient presents with   Follow-up    Patient is a 62 y.o. with a past medical history stated below who presents today for follow-up for diabetes and blood pressure medication management. Please see problem based assessment and plan for additional details.    For diabetes, patient reports taking Ozempic 1 mg weekly injections and metformin 500 BID. She wears a CGM [facility]) and would like a refill. For neuropathic pain, patient takes Gabapentin 300 mg about 4 times a day (3 at night, 1 in the morning). Denies any adverse effects from her medications and endorses taking them regularly. Has a history of chronic loose stools and does not feel like they are any worse on Ozempic or Metformin. Is happy with Ozempic and would like to know when she can go up to the max dose. Last A1C 7.3% on 06/03/2023. Recent eye exam with Dr. Dione Booze (4-5 months ago), will have results faxed in. Checks feet regularly.   For high blood pressure, patient takes amlodipine 5 mg daily, metoprolol 50 daily, and losartan-hydrochlorothiazide 100-25 daily. Patient denies any CP, SOB, heart palpitations, or headache. She is tolerating medications well and takes them consistently. Did not take them prior to appointment this morning.   Patient reports some discomfort around the vaginal area. Denies burning with urination, discharge, or changes in frequency, but does endorse tingling around the clitoris. This has been going on for about a week. Has a history of UTI with sepsis.   Patient requesting refills for Repatha, Metformin, and Freestyle Libre.    Past Medical History:  Diagnosis Date   Anemia    Anxiety    Aortic valve stenosis, severe    Arthritis    PAIN AND SEVERE OA LEFT KNEE ; S/P RIGHT TKA ON 02/03/12; HAS LOWER BACK PAIN-UNABLE TO STAND MORE THAN 10 MIN;  ARTHRITIS "ALL OVER"   Asthma    Blood transfusion    [facility]   Breast cancer in female [facility])    Right   CAD (coronary artery disease)    Cath 2010 with DES x 1 RCA-- PT'S CARDIOLOGIST IS DR. MCALHANY   Chronic diastolic congestive heart failure (HCC)    COPD (chronic obstructive pulmonary disease) (HCC)    Depression    Diabetes mellitus DIAGNOSED IN2010   Dyspnea    with much ambulation   Eczema    on back   Headache    migraines younger- rare now 02/07/21   Heart murmur    no current problems   History of hiatal hernia    History of kidney stones    passed or blasted   Hyperlipidemia    Hypertension    Morbid obesity with body mass index of 60.0-69.9 in adult [facility])    Myocardial infarction (HCC)    PT THINKS SHE WAS DX WITH MI AT THE TIME OF HEART STENTING   Neuromuscular disorder (HCC)    bilateral arm/hands   Oxygen dependent    uses 3L oxygen night/prn   Personal history of radiation therapy    Pneumonia    Pulmonary embolism (HCC) 02/08/2012   S/P RT TOTAL KNEE ON 02/03/12--ON 02/08/12--DEVELOPED ACUTE SOB AND CHEST PAIN--AND DIAGNOSED WITH  PULMONARY EMBOLUS AND PNEUMONIA   Restless leg syndrome    Sleep apnea    uses 3 liters O2 at night  as needed   Uterine fibroid    NO PROBLEMS AT PRESENT FROM THE FIBROIDS-STATES SHE IS POST MENOPAUSAL-LAST MENSES 2010 EXCEPT FOR EPISODE THIS YR OF BLEEDING RELATED TO FIBROIDS.   Weakness    BOTH HANDS - S/P BILATERAL CARPAL TUNNEL RELEASE--BUT STILL HAS WEAKNESS--OFTEN DROPS THINGS      Review of Systems  Constitutional:  Negative for chills and fever.  Eyes:        Eye itch  Respiratory:  Negative for shortness of breath.   Cardiovascular:  Negative for chest pain, palpitations and leg swelling.  Gastrointestinal:  Negative for abdominal pain, nausea and vomiting.       Chronic loose stools s/p cholecystectomy   Skin:  Negative for rash.  Neurological:  Positive for tingling. Negative for dizziness, weakness and  headaches.      Objective:     BP (!) 153/74 (BP Location: Left Arm, Patient Position: Sitting, Cuff Size: Normal)   Pulse 86   Temp 98.6 F (37 C) (Oral)   Ht 1' (0.305 m)   Wt (!) 300 lb 1.6 oz (136.1 kg)   LMP 02/03/2012   SpO2 97%   BMI 1465.23 kg/m  BP Readings from Last 3 Encounters:  08/18/23 (!) 153/74  08/11/23 126/78  06/03/23 (!) 127/59   Wt Readings from Last 3 Encounters:  08/18/23 (!) 300 lb 1.6 oz (136.1 kg)  08/11/23 (!) 301 lb (136.5 kg)  06/03/23 (!) 303 lb 14.4 oz (137.8 kg)      Physical Exam HENT:     Head: Normocephalic and atraumatic.     Mouth/Throat:     Mouth: Mucous membranes are moist.  Eyes:     Extraocular Movements: Extraocular movements intact.     Pupils: Pupils are equal, round, and reactive to light.  Cardiovascular:     Rate and Rhythm: Normal rate and regular rhythm.     Pulses: Normal pulses.     Heart sounds: Normal heart sounds.  Pulmonary:     Effort: Pulmonary effort is normal.     Breath sounds: Normal breath sounds.  Abdominal:     General: Bowel sounds are normal. There is no distension.     Palpations: Abdomen is soft.  Musculoskeletal:        General: No swelling.  Skin:    General: Skin is warm and dry.  Neurological:     General: No focal deficit present.     Mental Status: She is alert.  Psychiatric:        Mood and Affect: Mood normal.        Behavior: Behavior normal.    Results for orders placed or performed in visit on 08/18/23  Glucose, capillary  Result Value Ref Range   Glucose-Capillary 151 (H) 70 - 99 mg/dL  POC Hbg Y7W  Result Value Ref Range   Hemoglobin A1C 7.0 (A) 4.0 - 5.6 %   HbA1c POC (<> result, manual entry)     HbA1c, POC (prediabetic range)     HbA1c, POC (controlled diabetic range)      Last hemoglobin A1c Lab Results  Component Value Date   HGBA1C 7.0 (A) 08/18/2023    The ASCVD Risk score (Arnett DK, et al., 2019) failed to calculate for the following reasons:   The  patient has a prior MI or stroke diagnosis    Assessment & Plan:   Problem List Items Addressed This Visit       Cardiovascular and Mediastinum   CAD (coronary  artery disease) (Chronic)    Stable chronic issue. Patient HDS, denies CP, SOB, heart palpitations, or swelling today.   Continue Plavix 75 mg daily, Amlodipine 5 mg daily, Metoprolol succinate 50 mg daily, Repatha injection q2 weeks, Losartan-Hydroclorothiazide 100-25 mg daily. Patient tolerating well.       Relevant Medications   Evolocumab (REPATHA SURECLICK) 140 MG/ML SOAJ   Hypertension associated with diabetes (HCC) (Chronic)    Patient's blood pressure today 153/74. Per patient, she did not take her blood pressure medications this morning. Patient has been taking Amlodipine 5 mg daily, Metoprolol succinate 50 mg daily, and Losartan-Hydroclorothiazide 100-25 mg daily consistently. CV exam unremarkable today. She would like to get assistance obtaining an arm blood pressure cuff. Will get in touch with Juanell Fairly to help arrange for patient. No changes to BP medications today.       Relevant Medications   Evolocumab (REPATHA SURECLICK) 140 MG/ML SOAJ   metFORMIN (GLUCOPHAGE-XR) 500 MG 24 hr tablet   Semaglutide, 2 MG/DOSE, 8 MG/3ML SOPN     Endocrine   Type 2 diabetes mellitus without complication, without long-term current use of insulin (HCC) (Chronic)    Patient tolerating Ozempic 1 mg weekly injections, Metformin 500 BID, Gabapentin 300 mg (3 at night, 1 in the morning). Tolerating all well. A1C 7.3% in 06/03/23. Diabetic eye exam is up to date, follows with Dr. Dione Booze. Foot exam unremarkable today. Patient's current weight 300 lb 1.6 oz. Patient wants to continue making progress with WL and A1C goal, will increase Ozempic to Ozempic 2 mg weekly. Patient advised to call if she has adverse effects with increased dose. Will also reorder Freestyle Libre CGM monitor for patient today.       Relevant Medications   Continuous  Glucose Sensor (FREESTYLE LIBRE 3 SENSOR) MISC   metFORMIN (GLUCOPHAGE-XR) 500 MG 24 hr tablet   Semaglutide, 2 MG/DOSE, 8 MG/3ML SOPN   Other Relevant Orders   POC Hbg A1C (Completed)   CMP14 + Anion Gap   Urinalysis, Reflex Microscopic   Other Visit Diagnoses     History of iron deficiency anemia    -  Primary   Relevant Orders   Iron, TIBC and Ferritin Panel   CBC no Diff   Hyperlipidemia LDL goal <70       Relevant Medications   Evolocumab (REPATHA SURECLICK) 140 MG/ML SOAJ       Return in about 4 weeks (around 09/15/2023) for Diabetes medication follow up and blood pressure check .   Patient seen with Dr. Antony Contras.   Bula Cavalieri Colbert Coyer, MD

## 2023-08-18 NOTE — Assessment & Plan Note (Addendum)
Stable chronic issue. Patient [name], denies CP, SOB, heart palpitations, or swelling today.   Continue Plavix 75 mg daily, Amlodipine 5 mg daily, Metoprolol succinate 50 mg daily, Repatha injection q2 weeks, Losartan-Hydroclorothiazide 100-25 mg daily. Patient tolerating well.

## 2023-08-18 NOTE — Assessment & Plan Note (Addendum)
Patient's blood pressure today 153/74. Per patient, she did not take her blood pressure medications this morning. Patient has been taking Amlodipine 5 mg daily, Metoprolol succinate 50 mg daily, and Losartan-Hydroclorothiazide 100-25 mg daily consistently. CV exam unremarkable today. She would like to get assistance obtaining an arm blood pressure cuff. Will get in touch with Juanell Fairly to help arrange for patient. No changes to BP medications today.

## 2023-08-18 NOTE — Patient Instructions (Addendum)
Thank you, [name] for allowing [place] to provide your care today. Today we discussed your blood pressure and diabetes medications.   I have ordered the following labs for you:  Lab Orders         Glucose, capillary         CMP14 + Anion Gap         Iron, TIBC and Ferritin Panel         CBC no Diff         Urinalysis, Reflex Microscopic         POC Hbg A1C      Tests ordered today:  None  Referrals ordered today:   Referral Orders  No referral(s) requested today     I have ordered the following medication/changed the following medications:   Stop the following medications: Medications Discontinued During This Encounter  Medication Reason   Evolocumab (REPATHA SURECLICK) 140 MG/ML SOAJ Reorder   metFORMIN (GLUCOPHAGE-XR) 500 MG 24 hr tablet Reorder   Continuous Glucose Sensor (FREESTYLE LIBRE 3 SENSOR) MISC Reorder   Semaglutide, 1 MG/DOSE, 4 MG/3ML SOPN Dose change   Continuous Glucose Sensor (FREESTYLE LIBRE 3 SENSOR) MISC    metFORMIN (GLUCOPHAGE-XR) 500 MG 24 hr tablet    Evolocumab (REPATHA SURECLICK) 140 MG/ML SOAJ      Start the following medications: Meds ordered this encounter  Medications   DISCONTD: Continuous Glucose Sensor (FREESTYLE LIBRE 3 SENSOR) MISC    Sig: Place 1 sensor on the skin every 14 days. Use to check glucose continuously    Dispense:  2 each    Refill:  11   DISCONTD: metFORMIN (GLUCOPHAGE-XR) 500 MG 24 hr tablet    Sig: Take 1 tablet (500 mg total) by mouth 2 (two) times daily.    Dispense:  180 tablet    Refill:  0   DISCONTD: Evolocumab (REPATHA SURECLICK) 140 MG/ML SOAJ    Sig: Inject 140 mg into the skin every 14 (fourteen) days.    Dispense:  6 mL    Refill:  3   Continuous Glucose Sensor (FREESTYLE LIBRE 3 SENSOR) MISC    Sig: Place 1 sensor on the skin every 14 days. Use to check glucose continuously    Dispense:  2 each    Refill:  11   Evolocumab (REPATHA SURECLICK) 140 MG/ML SOAJ    Sig: Inject 140 mg into the  skin every 14 (fourteen) days.    Dispense:  6 mL    Refill:  3   metFORMIN (GLUCOPHAGE-XR) 500 MG 24 hr tablet    Sig: Take 1 tablet (500 mg total) by mouth 2 (two) times daily.    Dispense:  180 tablet    Refill:  0   Semaglutide, 2 MG/DOSE, 8 MG/3ML SOPN    Sig: Inject 2 mg into the skin once a week.    Dispense:  9 mL    Refill:  1     Follow up:  4 weeks   Remember:   - You will be notified about the blood pressure cuff. Please let us know if you have not received it by the next visit.   - Your Ozempic has increased from 1 mg to 2 mg per week. Please let us know if you have side effects with the Ozempic increase in dose.   - I sent in your refills, please let us know if you have any issues picking them up.   - I will call you about your  lab results, today we are looking at your A1C, your iron, and your kidney function. If there is any treatment needed we can discuss together and I can send it in.   Should you have any questions or concerns please call the internal medicine clinic at [phone].     [name] Haubner Colbert Coyer, MD PGY-1 Internal Medicine Teaching Progam [facility]

## 2023-08-19 ENCOUNTER — Other Ambulatory Visit: Payer: Self-pay | Admitting: Student

## 2023-08-19 ENCOUNTER — Other Ambulatory Visit: Payer: Medicaid Other

## 2023-08-19 ENCOUNTER — Telehealth: Payer: Self-pay

## 2023-08-19 LAB — URINALYSIS, ROUTINE W REFLEX MICROSCOPIC
Bilirubin, UA: NEGATIVE
Glucose, UA: NEGATIVE
Ketones, UA: NEGATIVE
Nitrite, UA: NEGATIVE
Protein,UA: NEGATIVE
RBC, UA: NEGATIVE
Specific Gravity, UA: 1.011 (ref 1.005–1.030)
Urobilinogen, Ur: 0.2 mg/dL (ref 0.2–1.0)
pH, UA: 6.5 (ref 5.0–7.5)

## 2023-08-19 LAB — MICROSCOPIC EXAMINATION
Casts: NONE SEEN /LPF
RBC, Urine: NONE SEEN /HPF (ref 0–2)
WBC, UA: >30 /HPF — AB (ref 0–5)

## 2023-08-19 MED ORDER — BLOOD PRESSURE CUFF MISC: 0 refills | Status: AC

## 2023-08-19 NOTE — Progress Notes (Signed)
Internal Medicine Clinic Attending  I was physically present during the key portions of the resident provided service and participated in the medical decision making of patient's management care. I reviewed pertinent patient test results.  The assessment, diagnosis, and plan were formulated together and I agree with the documentation in the resident's note.  Guilloud, Carolyn, MD  

## 2023-08-19 NOTE — Telephone Encounter (Signed)
Prior Authorization for patient (Ozempic) came through on cover my meds was submitted with last office notes and labs awaiting approval or denial.  XLK:[id number]

## 2023-08-19 NOTE — Telephone Encounter (Signed)
Decision:Approved [place] [facility] (Key: [id number]) Rx #: [id number] Ozempic (2 MG/DOSE) 8MG Ronny Bacon pen-injectors Form [employer] Prior Authorization Request Form [phone] NCPDP) Created Message from Plan Approved. This drug has been approved. Approved quantity: 9 mL per 84 day(s). You may fill up to a 34 day supply at a retail pharmacy. You may fill up to a 90 day supply for maintenance drugs, please refer to the formulary for details. Please call the pharmacy to process your prescription claim.. Authorization Expiration Date: August 18, 2024.

## 2023-08-20 ENCOUNTER — Telehealth: Payer: Self-pay

## 2023-08-20 ENCOUNTER — Encounter: Payer: Self-pay | Admitting: Student

## 2023-08-20 NOTE — Telephone Encounter (Signed)
   Name: [name]  DOB: 1961/01/06  MRN: [id number]  Primary Cardiologist: Verne Carrow, MD   Preoperative team, please contact this patient and set up a phone call appointment for further preoperative risk assessment. Please obtain consent and complete medication review. Thank you for your help.  I confirm that guidance regarding antiplatelet and oral anticoagulation therapy has been completed and, if necessary, noted below.  Per protocol patient can hold Plavix 5 days prior to procedure  Napoleon Form, Leodis Rains, NP 08/20/2023, 8:26 AM [facility]

## 2023-08-20 NOTE — Telephone Encounter (Signed)
Pt is scheduled for tele preop on 09/13 at 10:20am. Med rec and consent.

## 2023-08-20 NOTE — Telephone Encounter (Signed)
   Pre-operative Risk Assessment    Patient Name: [name]  DOB: 1961/02/04 MRN: [id number]   LAST APPOINTMENT: 12/25/2022 [facility] NEXT APPOINTMENT: 12/15/23 [facility]  Request for Surgical Clearance    Procedure:   RIGHT REVERSE SHOULDER ARTHROPLASTY   Date of Surgery:  Clearance 09/15/23                                 Surgeon:  Burnard Bunting, MD Surgeon's Group or Practice Name:  [facility] Phone number:  [phone] Fax number:  [phone]   Type of Clearance Requested:   - Medical  - Pharmacy:  Hold Clopidogrel (Plavix)     Type of Anesthesia:  General    Additional requests/questions:   N/A  [name]   08/20/2023, 8:05 AM

## 2023-08-20 NOTE — Telephone Encounter (Signed)
Pt is scheduled for tele preop on 09/13 at 10:20am. Med rec and consent.     Patient Consent for Virtual Visit        [name] has provided verbal consent on 08/20/2023 for a virtual visit (video or telephone).   CONSENT FOR VIRTUAL VISIT FOR:  [name]  By participating in this virtual visit I agree to the following:  I hereby voluntarily request, consent and authorize [facility] and its employed or contracted physicians, physician assistants, nurse practitioners or other licensed health care professionals (the Practitioner), to provide me with telemedicine health care services (the "Services") as deemed necessary by the treating Practitioner. I acknowledge and consent to receive the Services by the Practitioner via telemedicine. I understand that the telemedicine visit will involve communicating with the Practitioner through live audiovisual communication technology and the disclosure of certain medical information by electronic transmission. I acknowledge that I have been given the opportunity to request an in-person assessment or other available alternative prior to the telemedicine visit and am voluntarily participating in the telemedicine visit.  I understand that I have the right to withhold or withdraw my consent to the use of telemedicine in the course of my care at any time, without affecting my right to future care or treatment, and that the Practitioner or I may terminate the telemedicine visit at any time. I understand that I have the right to inspect all information obtained and/or recorded in the course of the telemedicine visit and may receive copies of available information for a reasonable fee.  I understand that some of the potential risks of receiving the Services via telemedicine include:  Delay or interruption in medical evaluation due to technological equipment failure or disruption; Information transmitted may not be sufficient (e.g. poor  resolution of images) to allow for appropriate medical decision making by the Practitioner; and/or  In rare instances, security protocols could fail, causing a breach of personal health information.  Furthermore, I acknowledge that it is my responsibility to provide information about my medical history, conditions and care that is complete and accurate to the best of my ability. I acknowledge that Practitioner's advice, recommendations, and/or decision may be based on factors not within their control, such as incomplete or inaccurate data provided by me or distortions of diagnostic images or specimens that may result from electronic transmissions. I understand that the practice of medicine is not an exact science and that Practitioner makes no warranties or guarantees regarding treatment outcomes. I acknowledge that a copy of this consent can be made available to me via my patient portal [facility] MyChart), or I can request a printed copy by calling the office of [facility].    I understand that my insurance will be billed for this visit.   I have read or had this consent read to me. I understand the contents of this consent, which adequately explains the benefits and risks of the Services being provided via telemedicine.  I have been provided ample opportunity to ask questions regarding this consent and the Services and have had my questions answered to my satisfaction. I give my informed consent for the services to be provided through the use of telemedicine in my medical care

## 2023-08-21 ENCOUNTER — Other Ambulatory Visit: Payer: Medicaid Other | Admitting: Obstetrics and Gynecology

## 2023-08-21 LAB — IRON,TIBC AND FERRITIN PANEL
Ferritin: 35 ng/mL (ref 15–150)
Iron Saturation: 17 % (ref 15–55)
Iron: 80 ug/dL (ref 27–139)
Total Iron Binding Capacity: 474 ug/dL — ABNORMAL HIGH (ref 250–450)
UIBC: 394 ug/dL — ABNORMAL HIGH (ref 118–369)

## 2023-08-21 LAB — CBC
Hematocrit: 43.5 % (ref 34.0–46.6)
Hemoglobin: 14.4 g/dL (ref 11.1–15.9)
MCH: 28.7 pg (ref 26.6–33.0)
MCHC: 33.1 g/dL (ref 31.5–35.7)
MCV: 87 fL (ref 79–97)
Platelets: 233 10*3/uL (ref 150–450)
RBC: 5.01 x10E6/uL (ref 3.77–5.28)
RDW: 13.1 % (ref 11.7–15.4)
WBC: 7.6 10*3/uL (ref 3.4–10.8)

## 2023-08-21 LAB — CMP14 + ANION GAP
ALT: 12 IU/L (ref 0–32)
AST: 16 IU/L (ref 0–40)
Albumin: 4.5 g/dL (ref 3.9–4.9)
Alkaline Phosphatase: 81 IU/L (ref 44–121)
Anion Gap: 17 mmol/L (ref 10.0–18.0)
BUN/Creatinine Ratio: 15 (ref 12–28)
BUN: 14 mg/dL (ref 8–27)
Bilirubin Total: 0.7 mg/dL (ref 0.0–1.2)
CO2: 26 mmol/L (ref 20–29)
Calcium: 9.8 mg/dL (ref 8.7–10.3)
Chloride: 95 mmol/L — ABNORMAL LOW (ref 96–106)
Creatinine, Ser: 0.94 mg/dL (ref 0.57–1.00)
Globulin, Total: 2.5 g/dL (ref 1.5–4.5)
Glucose: 130 mg/dL — ABNORMAL HIGH (ref 70–99)
Potassium: 4.1 mmol/L (ref 3.5–5.2)
Sodium: 138 mmol/L (ref 134–144)
Total Protein: 7 g/dL (ref 6.0–8.5)
eGFR: 68 mL/min/{1.73_m2} (ref >59)

## 2023-08-21 NOTE — Patient Outreach (Signed)
Medicaid Managed Care   Nurse Care Manager Note  08/21/2023 Name:  [name] MRN:  [id number] DOB:  [birth year]  [name] is an 62 y.o. year old female who is a primary patient of [name] Doheny, MD.  The Medicaid Managed Care Coordination team was consulted for assistance with:    Chronic healthcare management needs, GERD, OSA, HF, CAD, HLD, RLS, COPD, asthma, h/o breast cancer, sciatica, HTN, DM, anxiety/depression/PTSD/ neuropathy  [name] was given information about Medicaid Managed Care Coordination team services today. [name] Patient agreed to services and verbal consent obtained.  Engaged with patient by telephone for follow up visit in response to provider referral for case management and/or care coordination services.   Assessments/Interventions:  Review of past medical history, allergies, medications, health status, including review of consultants reports, laboratory and other test data, was performed as part of comprehensive evaluation and provision of chronic care management services.  SDOH (Social Determinants of Health) assessments and interventions performed: SDOH Interventions    Flowsheet Row Patient Outreach Telephone from 08/21/2023 in [place] [facility] Patient Outreach Telephone from 07/21/2023 in [place] [facility] Patient Outreach Telephone from 07/07/2023 in [place] [facility] DEPART[facility] Patient Outreach Telephone from 06/16/2023 in [place] [facility] DEPARTMENT Office Visit from 06/03/2023 in [facility] Patient Outreach Telephone from 05/12/2023 in [place] POPULATION [facility] DEPARTMENT  SDOH Interventions        Food Insecurity Interventions -- -- -- Intervention Not Indicated -- --  Housing Interventions -- -- -- Intervention Not Indicated -- --  Alcohol Usage Interventions Intervention Not Indicated (Score <7) -- -- -- -- --   Depression Interventions/Treatment  -- -- -- -- Currently on Treatment --  Financial Strain Interventions -- -- -- -- -- Intervention Not Indicated  Physical Activity Interventions -- Other (Comments)  [not able to perform exercise at this level] -- -- -- --  Stress Interventions -- -- Provide Counseling, Offered [facility] therapy per her choice yesterday] -- -- --  Health Literacy Interventions -- Intervention Not Indicated -- -- -- --     Care Plan No Known Allergies  Medications Reviewed Today     Reviewed by [name], RN (Registered Nurse) on 08/21/23 at 1102  Med List Status: <None>   Medication Order Taking? Sig Documenting Provider Last Dose Status Informant  acetaminophen (TYLENOL) 500 MG tablet [id number] No Take 1,000 mg by mouth every 6 (six) hours as needed for moderate pain. [provider] Taking Active Self  amLODipine (NORVASC) 5 MG tablet [id number] No Take 1 tablet (5 mg total) by mouth daily. Evlyn Kanner, MD Taking Active   atorvastatin (LIPITOR) 80 MG tablet [id number] No Take 1 tablet (80 mg total) by mouth daily. Evlyn Kanner, MD Taking Active   Blood Pressure Monitoring (BLOOD PRESSURE CUFF) MISC [id number]  Please follow insert instructions. [name] Doheny, MD  Active   busPIRone (BUSPAR) 15 MG tablet [id number] No Take 1 tablet (15 mg total) by mouth 2 (two) times daily. Shanna Cisco, NP Taking Active   cholecalciferol (VITAMIN D3) 25 MCG (1000 UNIT) tablet [id number] No Take 1,000 Units by mouth daily. [provider] Taking Active Self  clopidogrel (PLAVIX) 75 MG tablet [id number] No TAKE 1 TABLET BY MOUTH IN THE Wylie Hail, MD Taking Active   Continuous Glucose Sensor (FREESTYLE LIBRE 3 SENSOR) [place] [id number] No PLACE 1 SENSOR ON THE SKIN EVERY  14 DAYS TO CHECK GLUCOSE CONTINUOUSLY Rana Snare, DO Taking Active   Continuous Glucose Sensor (FREESTYLE LIBRE 3 SENSOR)  [place] [id number]  Place 1 sensor on the skin every 14 days. Use to check glucose continuously Reymundo Poll, MD  Active   DULoxetine (CYMBALTA) 20 MG capsule [id number] No Take 1 capsule (20 mg total) by mouth daily. Shanna Cisco, NP Taking Active   Evolocumab (REPATHA SURECLICK) 140 MG/ML Ivory Broad [id number]  Inject 140 mg into the skin every 14 (fourteen) days. Reymundo Poll, MD  Active   exemestane (AROMASIN) 25 MG tablet [id number] No TAKE 1 TABLET BY MOUTH ONCE DAILY AFTER Philippa Sicks, MD Taking Active Self  ezetimibe (ZETIA) 10 MG tablet [id number] No Take 1 tablet by mouth once daily Swinyer, Zachary George, NP Taking Active   ferrous sulfate 325 (65 FE) MG tablet [id number] No Take 1 tablet (325 mg total) by mouth every other day.  Patient not taking: Reported on 05/21/2023   Merrilyn Puma, MD Not Taking Expired 07/08/23 2359 Self  Discontinued 05/09/20 1429   Fluticasone-Umeclidin-Vilant (TRELEGY ELLIPTA) 100-62.5-25 MCG/ACT AEPB [id number] No INHALE 1 PUFF ONCE DAILY Faith Rogue, DO Taking Active   furosemide (LASIX) 40 MG tablet [id number] No Take 1 tablet (40 mg total) by mouth as needed for fluid or edema. Chauncey Mann, DO Taking Active Self  gabapentin (NEURONTIN) 300 MG capsule [id number] No TAKE 2 CAPSULES BY MOUTH IN THE MORNING AND 3 AT BEDTIME Steffanie Rainwater, MD Taking Active            Med Note Clearance Coots, CATHERINE T   Thu Jul 09, 2023  1:10 PM) Taking 1 QAM, 3 QPM  hydrocortisone cream 1 % [id number] No Apply 1 Application topically daily as needed for itching. [provider] Taking Active Self  losartan-hydrochlorothiazide (HYZAAR) 100-25 MG tablet [id number] No Take 1 tablet by mouth daily. Please repeat kidney labs to get additional refills Faith Rogue, DO Taking Active   metFORMIN (GLUCOPHAGE-XR) 500 MG 24 hr tablet [id number]  Take 1 tablet (500 mg total) by mouth 2 (two) times daily. Reymundo Poll, MD  Active   metoprolol succinate  (TOPROL-XL) 50 MG 24 hr tablet [id number] No TAKE 1 TABLET BY MOUTH ONCE DAILY WITH MEALS OR  IMMEDIATELY  FOLLOWING  A  MEAL Evlyn Kanner, MD Taking Active Self  montelukast (SINGULAIR) 10 MG tablet [id number] No TAKE 1 TABLET BY MOUTH AT BEDTIME Steffanie Rainwater, MD Taking Active   Semaglutide, 2 MG/DOSE, 8 MG/3ML SOPN [id number]  Inject 2 mg into the skin once a week. Reymundo Poll, MD  Active   senna-docusate (SENNA-TIME S) 8.6-50 MG tablet [id number] No Take 1 tablet by mouth at bedtime as needed for mild constipation. Merrilyn Puma, MD Taking Active Self  traZODone (DESYREL) 50 MG tablet [id number] No Take 1-2 tablets (50-100 mg total) by mouth at bedtime. Shanna Cisco, NP Taking Active   VENTOLIN HFA 108 (90 Base) MCG/ACT inhaler [id number] No Inhale 2 puffs into the lungs every 6 (six) hours as needed for wheezing or shortness of breath. Faith Rogue, DO Taking Active            Patient Active Problem List   Diagnosis Date Noted   Chronic constipation 08/15/2023   History of iron deficiency 08/15/2023   Antiplatelet or antithrombotic long-term use 08/15/2023   Abdominal hernia 03/19/2023   Diabetic neuropathy (HCC) 08/29/2022   Vaginal lesion 08/29/2022   PTSD (post-traumatic stress disorder) 06/24/2022   GAD (generalized anxiety  disorder) 06/24/2022   Major depressive disorder, recurrent episode, moderate (HCC) 06/24/2022   Urinary incontinence 06/20/2022   Intertrigo 06/20/2022   Arthritis of left shoulder region    S/P reverse total shoulder arthroplasty, 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   Healthcare maintenance 11/17/2021   Spondylolisthesis, lumbar region 02/11/2021    Class: Acute   Malignant neoplasm of upper-outer quadrant of right breast in female, estrogen receptor positive (HCC) 06/10/2018   Restless leg syndrome 02/04/2018   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   Depression 08/06/2015   Morbid obesity (HCC)    Hypertension associated with diabetes  (HCC)    Aortic stenosis s/p TAVR    Chronic diastolic congestive heart failure (HCC)    COPD (chronic obstructive pulmonary disease) (HCC) 09/21/2013   Obstructive sleep apnea 09/21/2013   Nephrolithiasis 09/21/2013   Nocturnal hypoxemia 09/12/2013   Hyperlipidemia 11/30/2012   History of pulmonary embolus (PE) 07/09/2012   Normocytic anemia 02/08/2012   Type 2 diabetes mellitus without complication, without long-term current use of insulin (HCC) 02/08/2012   Asthma 02/08/2012   GERD (gastroesophageal reflux disease)    CAD (coronary artery disease) 12/08/2011   Conditions to be addressed/monitored per PCP order:  Chronic healthcare management needs, GERD, OSA, HF, CAD, HLD, RLS, COPD, asthma, h/o breast cancer, sciatica, HTN, DM, anxiety/depression/PTSD/ neuropathy  Care Plan : RN Care Manager Plan of Care  Updates made by [name], RN since 08/21/2023 12:00 AM     Problem: Health Promotion or Disease Self-Management (General Plan of Care)   Priority: High  Onset Date: 04/14/2022     Long-Range Goal: Chronic Disease Management and Care Coordination Needs   Start Date: 04/14/2022  Expected End Date: 11/21/2023  Recent Progress: Not on track  Priority: High  Note:   Current Barriers:  Knowledge Deficits related to plan of care for management of Anxiety and Depression along with other chronic health conditions, HTN, DM, HF, asthma, COPD, OSA, arthritis, GERD, CAD, HLD, h/o breast cancer, RLS Chronic Disease Management support and education needs related to Anxiety and Depression. DM, HTN, HF, asthma, OSA, arthritis, GERD, CAD, HLD, h/o breast cancer, RLS. 08/21/23: Patient with cold-tested at PCP appt 8/20 and all negative.  BP cuff on order so patient may check BP at home.  Blood sugars 120-150.  Ozempic increased to 2.0 at PCP appt 8/20-weighs 300lbs.  Scheduled. to have right shoulder surgery 9/17.  No current breathing issues.  RNCM Clinical Goal(s):  Patient will verbalize  understanding of plan for management of Anxiety and Depression, DM, HTN  as evidenced by patient report take all medications exactly as prescribed and will call provider for medication related questions as evidenced by patient report demonstrate understanding of rationale for each prescribed medication as evidenced by patient report attend all scheduled medical appointments  as evidenced by patient report continue to work with RN Care Manager to address care management and care coordination needs related to  Anxiety and Depression, DM, HTN as evidenced by adherence to CM Team Scheduled appointments work with Child psychotherapist to address  related to the management of Mental Health Concerns  related to the management of Anxiety and Depression as evidenced by review of EMR and patient or social worker report through collaboration with Medical illustrator, provider, and care team.   Interventions: Inter-disciplinary care team collaboration (see longitudinal plan of care) Evaluation of current treatment plan related to  self management and patient's adherence to plan as established by provider   (Status:  New goal.)  Long Term Goal Evaluation of current treatment plan related to Anxiety and Depression, Mental Health Concerns  self-management and patient's adherence to plan as established by provider. Discussed plans with patient for ongoing care management follow up and provided patient with direct contact information for care management team Advised patient to contact provider for any additional DME needs, CBG, and possible general surgeon referral. Reviewed medications with patient Collaborated with LCSW regarding anxiety/depression Reviewed scheduled/upcoming provider appointments Advised patient, providing education and rationale, to check cbg and record, calling provider  for findings outside established parameters  Social Work referral for anxiety/depression Discussed plans with patient for ongoing care  management follow up and provided patient with direct contact information for care management team Assessed social determinant of health barriers Collaborated with Lubertha Sayres Managed Medicaid Liaison regarding DME-motorized wheelchair and lift chair. LCSW appointment rescheduled. Collaborated with Pharmacist. Pharmacy referral for polypharmacy at patient's request-completed  Asthma: (Status:New goal.) Long Term Goal Advised patient to track and manage Asthma triggers Advised patient to engage in light exercise as tolerated 3-5 days a week to aid in the the management of Asthma Provided education about and advised patient to utilize infection prevention strategies to reduce risk of respiratory infection Discussed the importance of adequate rest and management of fatigue with Asthma Assessed social determinant of health barriers   CAD Interventions: (Status:  New goal.) Long Term Goal Assessed understanding of CAD diagnosis Medications reviewed including medications utilized in CAD treatment plan Provided education on importance of blood pressure control in management of CAD Provided education on Importance of limiting foods high in cholesterol Counseled on importance of regular laboratory monitoring as prescribed Counseled on the importance of exercise goals with target of 150 minutes per week Reviewed Importance of taking all medications as prescribed Reviewed Importance of attending all scheduled provider appointments Advised to report any changes in symptoms or exercise tolerance Assessed social determinant of health barriers  Heart Failure Interventions:  (Status:  New goal.) Long Term Goal Basic overview and discussion of pathophysiology of Heart Failure reviewed Provided education on low sodium diet Assessed need for readable accurate scales in home Provided education about placing scale on hard, flat surface Advised patient to weigh each morning after emptying  bladder Discussed importance of daily weight and advised patient to weigh and record daily Discussed the importance of keeping all appointments with provider Provided patient with education about the role of exercise in the management of heart failure Assessed social determinant of health barriers   COPD Interventions:  (Status:  New goal.) Long Term Goal Advised patient to track and manage COPD triggers Advised patient to self assesses COPD action plan zone and make appointment with provider if in the yellow zone for 48 hours without improvement Advised patient to engage in light exercise as tolerated 3-5 days a week to aid in the the management of COPD Provided education about and advised patient to utilize infection prevention strategies to reduce risk of respiratory infection Discussed the importance of adequate rest and management of fatigue with COPD Assessed social determinant of health barriers  Hyperlipidemia Interventions:  (Status:  New goal.) Long Term Goal Medication review performed; medication list updated in electronic medical record.  Provider established cholesterol goals reviewed Counseled on importance of regular laboratory monitoring as prescribed Reviewed importance of limiting foods high in cholesterol Reviewed exercise goals and target of 150 minutes per week Assessed social determinant of health barriers    Diabetes Interventions:  (Status:  New goal.) Long Term Goal  Assessed patient's understanding of A1c goal: <7% Reviewed medications with patient and discussed importance of medication adherence Reviewed scheduled/upcoming provider appointments  Advised patient, providing education and rationale, to check cbg and record, calling provider for findings outside established parameters Review of patient status, including review of consultants reports, relevant laboratory and other test results, and medications completed Assessed social determinant of health  barriers Lab Results  Component Value Date   HGBA1C 7.1 (A) 08/27/2022  HGB A1C  6.6 on 01/08/23 HGB A1C  7.3 on 06/03/23 HGBA1C   7.0 on 08/18/23  Hypertension Interventions:  (Status:  New goal.) Long Term Goal Last practice recorded BP readings:  BP Readings from Last 3 Encounters:        08/18/23         153/74 03/18/23         130/50 06/03/23         127/59  Most recent eGFR/CrCl:  Lab Results  Component Value Date   EGFR 90 06/04/2022    No components found for: "CRCL"  Evaluation of current treatment plan related to hypertension self management and patient's adherence to plan as established by provider Reviewed medications with patient and discussed importance of compliance Discussed plans with patient for ongoing care management follow up and provided patient with direct contact information for care management team Advised patient, providing education and rationale, to monitor blood pressure daily and record, calling PCP for findings outside established parameters Reviewed scheduled/upcoming provider appointments including:  Assessed social determinant of health barriers   Patient Goals/Self-Care Activities: Take all medications as prescribed Attend all scheduled provider appointments Call pharmacy for medication refills 3-7 days in advance of running out of medications Perform all self care activities independently  Perform IADL's (shopping, preparing meals, housekeeping, managing finances) independently Call provider office for new concerns or questions  Work with the social worker to address care coordination needs and will continue to work with the clinical team to address health care and disease management related needs Patient to f/u with provider regarding sciatica pain-completed  Follow Up Plan:  The patient has been provided with contact information for the care management team and has been advised to call with any health related questions or concerns.  The care  management team will reach out to the patient again over the next 60 business  days.    Long-Range Goal: Establish Plan of Care for Chronic Disease Management Needs   Priority: High  Note:   Timeframe:  Long-Range Goal Priority:  High Start Date:      04/14/22                       Expected End Date:     ongoing                  Follow Up Date: 09/21/23   - schedule appointment for flu shot - schedule appointment for vaccines needed due to my age or health - schedule recommended health tests (blood work, mammogram, colonoscopy, pap test) - schedule and keep appointment for annual check-up    Why is this important?   Screening tests can find diseases early when they are easier to treat.  Your doctor or nurse will talk with you about which tests are important for you.  Getting shots for common diseases like the flu and shingles will help prevent them.  08/21/23:  patient seen and evaluated by PCP 8/20,  Upcoming right shoulder surgery 9/17 and ONC  appt 9/16.   Follow Up:  Patient agrees to Care Plan and Follow-up.  Plan: The Managed Medicaid care management team will reach out to the patient again over the next 30 business  days. and The  Patient has been provided with contact information for the Managed Medicaid care management team and has been advised to call with any health related questions or concerns.  Date/time of next scheduled RN care management/care coordination outreach: 09/21/23 at 315

## 2023-08-21 NOTE — Progress Notes (Signed)
Called patient to discuss results, patient identity verified with DOB. Patient denied dysuria, nothing nothing to do for asymptomatic bacteriuria. Hgb 14.4, MCV 87, Iron, Iron saturation, and Ferritin low normal. TIBC elevated. Will monitor in the event patient becomes symptomatic. Discussed importance of colonoscopy at least once in lifetime, especially with opportunity to remove polyps. Patient acknowledged understanding.

## 2023-08-21 NOTE — Patient Instructions (Signed)
Hi [name], thank you for updating me today, I hope you feel better and have a good afternoon and weekend!  [name] was given information about Medicaid Managed Care team care coordination services as a part of their [facility] Medicaid benefit. [name] verbally consented to engagement with the [employer] [facility] Managed Care team.   If you are experiencing a medical emergency, please call 911 or report to your local emergency department or urgent care.   If you have a non-emergency medical problem during routine business hours, please contact your provider's office and ask to speak with a nurse.   For questions related to your [facility] health plan, please call: [phone] or go here:[email or web address]  If you would like to schedule transportation through your [facility] plan, please call the following number at least 2 days in advance of your appointment: [phone].   You can also use the MTM portal or MTM mobile app to manage your rides. Reimbursement for transportation is available through [facility]! For the portal, please go to [email or web address]  Call the [facility] Crisis Line at [phone], at any time, 24 hours a day, 7 days a week. If you are in danger or need immediate medical attention call 911.  If you would like help to quit smoking, call 1-800-QUIT-NOW (302-452-4136) OR Espaol: 1-855-Djelo-Ya (1-062-694-8546) o para ms informacin haga clic aqu or Text READY to 270-350 to register via text  [name] - following are the goals we discussed in your visit today:   Goals Addressed    Timeframe:  Long-Range Goal Priority:  High Start Date:      04/14/22                       Expected End Date:     ongoing                  Follow Up Date: 09/21/23   - schedule appointment for flu shot - schedule appointment for vaccines needed due to my age or health - schedule recommended health tests (blood work, mammogram, colonoscopy, pap  test) - schedule and keep appointment for annual check-up    Why is this important?   Screening tests can find diseases early when they are easier to treat.  Your doctor or nurse will talk with you about which tests are important for you.  Getting shots for common diseases like the flu and shingles will help prevent them.  08/21/23:  patient seen and evaluated by PCP 8/20,  Upcoming right shoulder surgery 9/17 and ONC appt 9/16.  Patient verbalizes understanding of instructions and care plan provided today and agrees to view in MyChart. Active MyChart status and patient understanding of how to access instructions and care plan via MyChart confirmed with patient.     The Managed Medicaid care management team will [name] out to the patient again over the next 30 business  days.  The  Patient has been provided with contact information for the Managed Medicaid care management team and has been advised to call with any health related questions or concerns.   [name] RN, BSN [name]  [facility] Care Management Coordinator - Managed Medicaid High Risk [phone]   Following is a copy of your plan of care:  Care Plan : RN Care Manager Plan of Care  Updates made by [name], RN since 08/21/2023 12:00 AM     Problem: Health Promotion or Disease Self-Management (General Plan of Care)  Priority: High  Onset Date: 04/14/2022     Long-Range Goal: Chronic Disease Management and Care Coordination Needs   Start Date: 04/14/2022  Expected End Date: 11/21/2023  Recent Progress: Not on track  Priority: High  Note:   Current Barriers:  Knowledge Deficits related to plan of care for management of Anxiety and Depression along with other chronic health conditions, HTN, DM, HF, asthma, COPD, OSA, arthritis, GERD, CAD, HLD, h/o breast cancer, RLS Chronic Disease Management support and education needs related to Anxiety and Depression. DM, HTN, HF, asthma, OSA, arthritis, GERD,  CAD, HLD, h/o breast cancer, RLS. 08/21/23: Patient with cold-tested at PCP appt 8/20 and all negative.  BP cuff on order so patient may check BP at home.  Blood sugars 120-150.  Ozempic increased to 2.0 at PCP appt 8/20-weighs 300lbs.  Scheduled. to have right shoulder surgery 9/17.  No current breathing issues.  RNCM Clinical Goal(s):  Patient will verbalize understanding of plan for management of Anxiety and Depression, DM, HTN  as evidenced by patient report take all medications exactly as prescribed and will call provider for medication related questions as evidenced by patient report demonstrate understanding of rationale for each prescribed medication as evidenced by patient report attend all scheduled medical appointments  as evidenced by patient report continue to work with RN Care Manager to address care management and care coordination needs related to  Anxiety and Depression, DM, HTN as evidenced by adherence to CM Team Scheduled appointments work with Child psychotherapist to address  related to the management of Mental Health Concerns  related to the management of Anxiety and Depression as evidenced by review of EMR and patient or Child psychotherapist report through collaboration with Medical illustrator, provider, and care team.   Interventions: Inter-disciplinary care team collaboration (see longitudinal plan of care) Evaluation of current treatment plan related to  self management and patient's adherence to plan as established by provider   (Status:  New goal.)  Long Term Goal Evaluation of current treatment plan related to Anxiety and Depression, Mental Health Concerns  self-management and patient's adherence to plan as established by provider. Discussed plans with patient for ongoing care management follow up and provided patient with direct contact information for care management team Advised patient to contact provider for any additional DME needs, CBG, and possible general surgeon  referral. Reviewed medications with patient Collaborated with LCSW regarding anxiety/depression Reviewed scheduled/upcoming provider appointments Advised patient, providing education and rationale, to check cbg and record, calling provider  for findings outside established parameters  Social Work referral for anxiety/depression Discussed plans with patient for ongoing care management follow up and provided patient with direct contact information for care management team Assessed social determinant of health barriers Collaborated with Lubertha Sayres Managed Medicaid Liaison regarding DME-motorized wheelchair and lift chair. LCSW appointment rescheduled. Collaborated with Pharmacist. Pharmacy referral for polypharmacy at patient's request-completed  Asthma: (Status:New goal.) Long Term Goal Advised patient to track and manage Asthma triggers Advised patient to engage in light exercise as tolerated 3-5 days a week to aid in the the management of Asthma Provided education about and advised patient to utilize infection prevention strategies to reduce risk of respiratory infection Discussed the importance of adequate rest and management of fatigue with Asthma Assessed social determinant of health barriers   CAD Interventions: (Status:  New goal.) Long Term Goal Assessed understanding of CAD diagnosis Medications reviewed including medications utilized in CAD treatment plan Provided education on importance of blood pressure control in management  of CAD Provided education on Importance of limiting foods high in cholesterol Counseled on importance of regular laboratory monitoring as prescribed Counseled on the importance of exercise goals with target of 150 minutes per week Reviewed Importance of taking all medications as prescribed Reviewed Importance of attending all scheduled provider appointments Advised to report any changes in symptoms or exercise tolerance Assessed social  determinant of health barriers  Heart Failure Interventions:  (Status:  New goal.) Long Term Goal Basic overview and discussion of pathophysiology of Heart Failure reviewed Provided education on low sodium diet Assessed need for readable accurate scales in home Provided education about placing scale on hard, flat surface Advised patient to weigh each morning after emptying bladder Discussed importance of daily weight and advised patient to weigh and record daily Discussed the importance of keeping all appointments with provider Provided patient with education about the role of exercise in the management of heart failure Assessed social determinant of health barriers   COPD Interventions:  (Status:  New goal.) Long Term Goal Advised patient to track and manage COPD triggers Advised patient to self assesses COPD action plan zone and make appointment with provider if in the yellow zone for 48 hours without improvement Advised patient to engage in light exercise as tolerated 3-5 days a week to aid in the the management of COPD Provided education about and advised patient to utilize infection prevention strategies to reduce risk of respiratory infection Discussed the importance of adequate rest and management of fatigue with COPD Assessed social determinant of health barriers  Hyperlipidemia Interventions:  (Status:  New goal.) Long Term Goal Medication review performed; medication list updated in electronic medical record.  Provider established cholesterol goals reviewed Counseled on importance of regular laboratory monitoring as prescribed Reviewed importance of limiting foods high in cholesterol Reviewed exercise goals and target of 150 minutes per week Assessed social determinant of health barriers    Diabetes Interventions:  (Status:  New goal.) Long Term Goal Assessed patient's understanding of A1c goal: <7% Reviewed medications with patient and discussed importance of medication  adherence Reviewed scheduled/upcoming provider appointments  Advised patient, providing education and rationale, to check cbg and record, calling provider for findings outside established parameters Review of patient status, including review of consultants reports, relevant laboratory and other test results, and medications completed Assessed social determinant of health barriers Lab Results  Component Value Date   HGBA1C 7.1 (A) 08/27/2022  HGB A1C  6.6 on 01/08/23 HGB A1C  7.3 on 06/03/23 HGBA1C   7.0 on 08/18/23  Hypertension Interventions:  (Status:  New goal.) Long Term Goal Last practice recorded BP readings:  BP Readings from Last 3 Encounters:        08/18/23         153/74 03/18/23         130/50 06/03/23         127/59  Most recent eGFR/CrCl:  Lab Results  Component Value Date   EGFR 90 06/04/2022    No components found for: "CRCL"  Evaluation of current treatment plan related to hypertension self management and patient's adherence to plan as established by provider Reviewed medications with patient and discussed importance of compliance Discussed plans with patient for ongoing care management follow up and provided patient with direct contact information for care management team Advised patient, providing education and rationale, to monitor blood pressure daily and record, calling PCP for findings outside established parameters Reviewed scheduled/upcoming provider appointments including:  Assessed social determinant of health barriers  Patient Goals/Self-Care Activities: Take all medications as prescribed Attend all scheduled provider appointments Call pharmacy for medication refills 3-7 days in advance of running out of medications Perform all self care activities independently  Perform IADL's (shopping, preparing meals, housekeeping, managing finances) independently Call provider office for new concerns or questions  Work with the social worker to address care  coordination needs and will continue to work with the clinical team to address health care and disease management related needs Patient to f/u with provider regarding sciatica pain-completed  Follow Up Plan:  The patient has been provided with contact information for the care management team and has been advised to call with any health related questions or concerns.  The care management team will [name] out to the patient again over the next 60 business  days.

## 2023-08-25 ENCOUNTER — Telehealth: Payer: Self-pay | Admitting: Gastroenterology

## 2023-08-25 DIAGNOSIS — Z1211 Encounter for screening for malignant neoplasm of colon: Secondary | ICD-10-CM

## 2023-08-25 NOTE — Addendum Note (Signed)
Addended byLucky Rathke on: 08/25/2023 04:50 PM   Modules accepted: Orders

## 2023-08-25 NOTE — Telephone Encounter (Signed)
Inbound call from patient stating she would like to continue with colorguard instead of colonoscopy for now. Requesting a call back to discuss how to proceed. Please advise, thank you.

## 2023-08-25 NOTE — Telephone Encounter (Signed)
Patient has been informed. Cologuard has been ordered. Patient voiced understanding.

## 2023-08-25 NOTE — Telephone Encounter (Signed)
Please review labs from 08/18/23 with PCP. Okay to proceed with Cologuard as patient has requested Cologuard vs Colonoscopy. Please advise.

## 2023-08-25 NOTE — Telephone Encounter (Signed)
Rovonda, Although she has a slight iron deficiency, I think it is reasonable for her to move forward with Cologuard.  If it is negative that will be good news.  If however she continues to have iron deficiency or progressive iron deficiency, we will still need to consider a colonoscopy at some point. Please order Cologuard. Thanks. GM,

## 2023-08-27 ENCOUNTER — Telehealth: Payer: Self-pay

## 2023-08-27 DIAGNOSIS — I10 Essential (primary) hypertension: Secondary | ICD-10-CM | POA: Diagnosis not present

## 2023-08-27 NOTE — Telephone Encounter (Signed)
All clinical has been submitted to [facility] [employer] [facility] for surgery auth and they are requesting additional clinical.  Requesting:  "Missing Clinical: Ordering physician's surgical plan. Please include laterality and all CPT codes"  Can you dictate an addendum to the last office note to include this so I can fax back to them. Thanks

## 2023-08-28 DIAGNOSIS — E119 Type 2 diabetes mellitus without complications: Secondary | ICD-10-CM | POA: Diagnosis not present

## 2023-08-28 DIAGNOSIS — I10 Essential (primary) hypertension: Secondary | ICD-10-CM | POA: Diagnosis not present

## 2023-08-28 DIAGNOSIS — J449 Chronic obstructive pulmonary disease, unspecified: Secondary | ICD-10-CM | POA: Diagnosis not present

## 2023-08-28 DIAGNOSIS — I219 Acute myocardial infarction, unspecified: Secondary | ICD-10-CM | POA: Diagnosis not present

## 2023-08-30 DIAGNOSIS — Z419 Encounter for procedure for purposes other than remedying health state, unspecified: Secondary | ICD-10-CM | POA: Diagnosis not present

## 2023-08-31 ENCOUNTER — Other Ambulatory Visit: Payer: Self-pay | Admitting: Student

## 2023-08-31 DIAGNOSIS — I152 Hypertension secondary to endocrine disorders: Secondary | ICD-10-CM

## 2023-09-03 ENCOUNTER — Ambulatory Visit
Admission: RE | Admit: 2023-09-03 | Discharge: 2023-09-03 | Disposition: A | Discharge status: Home / Self Care | Payer: Medicaid Other | Source: Ambulatory Visit | Attending: Hematology and Oncology | Admitting: Hematology and Oncology

## 2023-09-03 DIAGNOSIS — E349 Endocrine disorder, unspecified: Secondary | ICD-10-CM | POA: Diagnosis not present

## 2023-09-03 DIAGNOSIS — C50411 Malignant neoplasm of upper-outer quadrant of right female breast: Secondary | ICD-10-CM

## 2023-09-03 DIAGNOSIS — M8588 Other specified disorders of bone density and structure, other site: Secondary | ICD-10-CM | POA: Diagnosis not present

## 2023-09-03 DIAGNOSIS — N958 Other specified menopausal and perimenopausal disorders: Secondary | ICD-10-CM | POA: Diagnosis not present

## 2023-09-03 DIAGNOSIS — Z853 Personal history of malignant neoplasm of breast: Secondary | ICD-10-CM | POA: Diagnosis not present

## 2023-09-06 DIAGNOSIS — Z1211 Encounter for screening for malignant neoplasm of colon: Secondary | ICD-10-CM | POA: Diagnosis not present

## 2023-09-07 ENCOUNTER — Other Ambulatory Visit: Payer: Medicaid Other | Admitting: Licensed Clinical Social Worker

## 2023-09-07 NOTE — Telephone Encounter (Signed)
Addendum done thx

## 2023-09-07 NOTE — Patient Outreach (Addendum)
Medicaid Managed Care Social Work Note  09/07/2023 Name:  [name] MRN:  [id number] DOB:  27-Jun-1961  [name] is an 62 y.o. year old female who is a primary patient of Philomena Doheny, MD.  The Medicaid Managed Care Coordination team was consulted for assistance with:  Mental Health Counseling and Resources  [name] was given information about Medicaid Managed Care Coordination team services today. [name] Patient agreed to services and verbal consent obtained.  Engaged with patient  for by telephone forfollow up visit in response to referral for case management and/or care coordination services.   Assessments/Interventions:  Review of past medical history, allergies, medications, health status, including review of consultants reports, laboratory and other test data, was performed as part of comprehensive evaluation and provision of chronic care management services.  SDOH: (Social Determinant of Health) assessments and interventions performed: SDOH Interventions    Flowsheet Row Patient Outreach Telephone from 09/07/2023 in [place] POP[facility]ATION [facility] DEPARTMENT Patient Outreach Telephone from 08/21/2023 in [place] POPULATION HEALTH DEPARTMENT Patient Outreach Telephone from 07/21/2023 in [place] POPULATION HEALTH DEPARTMENT Patient Outreach Telephone from 07/07/2023 in  POPULATION HEALTH DEPARTMENT Patient Outreach Telephone from 06/16/2023 in  POPULATION HEALTH DEPARTMENT Office Visit from 06/03/2023 in [facility]  SDOH Interventions        Food Insecurity Interventions -- -- -- -- Intervention Not Indicated --  Housing Interventions -- -- -- -- Intervention Not Indicated --  Alcohol Usage Interventions -- Intervention Not Indicated (Score <7) -- -- -- --  Depression Interventions/Treatment  -- -- -- -- -- Currently on Treatment  Physical Activity Interventions -- -- Other (Comments)  [not able to  perform exercise at this level] -- -- --  Stress Interventions Offered [employer], Provide Counseling -- -- Provide Counseling, Offered [employer]  [Patient ended therapy per her choice yesterday] -- --  Health Literacy Interventions -- -- Intervention Not Indicated -- -- --       Advanced Directives Status:  See Care Plan for related entries.  Care Plan                 No Known Allergies  Medications Reviewed Today   Medications were not reviewed in this encounter     Patient Active Problem List   Diagnosis Date Noted   Chronic constipation 08/15/2023   History of iron deficiency 08/15/2023   Antiplatelet or antithrombotic long-term use 08/15/2023   Abdominal hernia 03/19/2023   Diabetic neuropathy (HCC) 08/29/2022   Vaginal lesion 08/29/2022   PTSD (post-traumatic stress disorder) 06/24/2022   GAD (generalized anxiety disorder) 06/24/2022   Major depressive disorder, recurrent episode, moderate (HCC) 06/24/2022   Urinary incontinence 06/20/2022   Intertrigo 06/20/2022   Arthritis of left shoulder region    S/P reverse total shoulder arthroplasty, 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   Healthcare maintenance 11/17/2021   Spondylolisthesis, lumbar region 02/11/2021    Class: Acute   Malignant neoplasm of upper-outer quadrant of right breast in female, estrogen receptor positive (HCC) 06/10/2018   Restless leg syndrome 02/04/2018   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   Depression 08/06/2015   Morbid obesity (HCC)    Hypertension associated with diabetes (HCC)    Aortic stenosis s/p TAVR    Chronic diastolic congestive heart failure (HCC)    COPD (chronic obstructive pulmonary disease) (HCC) 09/21/2013   Obstructive sleep apnea 09/21/2013   Nephrolithiasis 09/21/2013   Nocturnal hypoxemia 09/12/2013   Hyperlipidemia 11/30/2012  History of pulmonary embolus (PE) 07/09/2012   Normocytic anemia 02/08/2012   Type 2 diabetes mellitus without  complication, without long-term current use of insulin (HCC) 02/08/2012   Asthma 02/08/2012   GERD (gastroesophageal reflux disease)    CAD (coronary artery disease) 12/08/2011    Conditions to be addressed/monitored per PCP order:  Depression  Care Plan : LCSW Plan of Care  Updates made by Gustavus Bryant, LCSW since 09/07/2023 12:00 AM     Problem: Depression Identification (Depression)      Long-Range Goal: Depressive Symptoms Identified   Start Date: 04/17/2022  Recent Progress: On track  Note:   Long-Range Goal: I want to start mental health treatment    Start Date: 04/17/2022  Priority: High  Note:   Priority: High   Timeframe:  Long-Range Goal Priority:  High Start Date:   04/17/22                Expected End Date:  09/07/23                Follow Up Date-- Social work discharge completed successfully on 09/07/23   Current barriers:   Chronic Mental Health needs related to depression, stress and anxiety. Patient requires Support, Education, Resources, Referrals, Advocacy, and Care Coordination, in order to meet Unmet Mental Health Needs. Patient will implement clinical interventions discussed today to decrease symptoms of depression and increase knowledge and/or ability of: coping skills. Mental Health Concerns and Social Isolation Patient lacks knowledge of available community counseling agencies and resources.   Clinical Goal(s): verbalize understanding of plan for management of Anxiety, Depression, and Stress and demonstrate a reduction in symptoms. Patient will connect with a provider for ongoing mental health treatment, increase coping skills, healthy habits, self-management skills, and stress reduction        Clinical Interventions:  Assessed patient's previous and current treatment, coping skills, support system and barriers to care. Patient provided history. Patient and spouse lived in a shared home with two other adults. Patient reports that this is a current and stable  living situation.  Verbalization of feelings encouraged, motivational interviewing employed Emotional support provided, positive coping strategies explored Self care/establishing healthy boundaries emphasized Patient reports that she takes medication for anxiety and depression. She reports that this has alleviated some of symptoms but not all. She reports that she still has crying spells. She needs help with finding psychiatry and counseling. She was successful in identifying triggers to anxiety and depression symptoms, in addition, to healthy coping skills.  Patient reports significant worsening anxiety and depression impacting her ability to function appropriately and carry out daily task. Patient receives strong support from spouse but he does not have anxiety or depression and is unable to relate to her and her daily mental health struggles.  Patient is agreeable to referral to [facility] for counseling and psychiatry. [facility] LCSW made referral on 04/17/22. Patient will call [facility] tomorrow on 04/18/22 to schedule counseling and psychiatry appointments. Email sent to her with [facility]'s contact information.  LCSW provided education on relaxation techniques such as meditation, deep breathing, massage, grounding exercises or yoga that can activate the body's relaxation response and ease symptoms of stress and anxiety. LCSW ask that when pt is struggling with difficult emotions and racing thoughts that they start this relaxation response process. LCSW provided extensive education on healthy coping skills for anxiety. SW used active and reflective listening, validated patient's feelings/concerns, and provided emotional support. Patient will work on implementing appropriate self-care habits into their  daily routine such as: staying positive, writing a gratitude list, drinking water, staying active around the house, taking their medications as prescribed, combating negative thoughts or emotions and staying connected with  their family and friends. Positive reinforcement provided for this decision to work on this. Patient was receptive to anxiety and depression management coping skill education. She will try to increase her socialization over the next 90 days to decrease isolation.  Patient has problems with sleep disturbance. LCSW provided education on healthy sleep hygiene and what that looks like. LCSW encouraged patient to implement a night time routine into her schedule that works best for her and that she is able to maintain. Advised patient to implement deep breathing/grounding/meditation/self-care exercises into her nightly routine to combat racing thoughts at night. LCSW encouraged patient to wake up at the same time each day, make their sleeping environment comfortable, exercise when able, to limit naps and to not eat or drink anything right before bed.   Motivational Interviewing employed Depression screen reviewed  PHQ2/ PHQ9 completed Mindfulness or Relaxation training provided Active listening / Reflection utilized  Advance Care and HCPOA education provided Emotional Support Provided Problem Solving /Task Center strategies reviewed Provided psychoeducation for mental health needs  Provided brief CBT  Reviewed mental health medications and discussed importance of compliance:  Quality of sleep assessed & Sleep Hygiene techniques promoted  Participation in counseling encouraged  Verbalization of feelings encouraged  Suicidal Ideation/Homicidal Ideation assessed: Patient denies SI/HI  Review resources, discussed options and provided patient information about  Mental Health Resources Inter-disciplinary care team collaboration (see longitudinal plan of care) Patient reports great improvement in both her mood and sleep management. She reports ongoing psychiatry visits and shares that she has been able to build great rapport with her long term [facility] provider. She has discontinued therapy for now but will  utilize this resource again in the future if needed and was able to build great rapport with this therapist. She is agreeable to this [facility] LCSW closing her case at this time due to the great progress she has made thus far. She is aware that she can call this [facility] LCSW at anytime and will be happy and able to assist her again. [facility] LCSW will update the involved [facility] team on this case closure. Positive reinforcement provided. Self-care education provided.   Recommended Patient activities: Attend scheduled medical appointments Utilize healthy coping skills and supportive resources discussed Contact PCP with any questions or concerns Keep 90 percent of counseling appointments Call your insurance provider for more information about your Enhanced Benefits  Check out counseling resources provided  Accept all calls from representative with [facility] in an effort to establish ongoing mental health counseling and supportive services. Incorporate into daily practice - relaxation techniques, deep breathing exercises, and mindfulness meditation strategies. Talk about feelings with friends, family members, spiritual advisor, etc. Contact LCSW directly [phone]), if you have questions, need assistance, or if additional social work needs are identified between now and our next scheduled telephone outreach call. Call 988 for mental health hotline/crisis line if needed (24/7 available) Try techniques to reduce symptoms of anxiety/negative thinking (deep breathing, distraction, positive self talk, etc)  - develop a personal safety plan - develop a plan to deal with triggers like holidays, anniversaries - exercise at least 2 to 3 times per week - have a plan for how to handle bad days - journal feelings and what helps to feel better or worse - spend time or talk with others at least  2 to 3 times per week - watch for early signs of feeling worse - begin personal counseling - call and visit an old friend - check out  volunteer opportunities - join a support group - laugh; watch a funny movie or comedian - learn and use visualization or guided imagery - perform a random act of kindness - practice relaxation or meditation daily - start or continue a personal journal - practice positive thinking and self-talk -continue with compliance of taking medication         24- Hour Availability:    [facility]  [address] [place], [place] [phone] Crisis [phone]   [facility] [phone]   [place] [facility]  [phone]    [facility]  [phone] (after hours)   Therapeutic Alternative/Mobile Crisis   [phone]   [place] National Suicide Hotline  431-635-2617 (TALK) OR 988   Call 911 or go to emergency room   [facility]  [phone]);  [place] and [employer]  [phone]); [place]ina Quint, [place], [place], [place], [place], [place]             37- Hour Availability:    [facility]  [address] [place], [place] [phone] Crisis [phone]   [facility] [phone]  [place] [facility]  [phone]    [facility]  [phone] (after hours)   Therapeutic Alternative/Mobile Crisis   [phone]   [place] National Suicide Hotline  [phone] Len Childs) [place] 614   Call [phone] for mental health emergencies   [facility]  [phone]);  [place] and [employer]  [phone]); [place], [place], [place], [place], [place], [place], [place]    [place] [facility] for [facility] Residents For 24/7 walk-up access to mental health services for [facility] children (4+), adolescents and adults, please visit the [facility] located at [address] in [place],  [place].  *[place] also provides comprehensive outpatient behavioral health services in a variety of locations around the [place].  Connect With [place] [address] [place], [place] [address] HelpLine: (332)797-3918 or 1-520-716-1078  Get Directions  Find Help 24/7 By Phone Call our 24-hour HelpLine at 510-175-9662 or 9152607927 for immediate assistance for mental health and substance abuse issues.  Walk-In Help [place]: [facility] (Ages 4 and Up) [place]: Emergency Dept., [facility] Additional Resources National Hopeline Network: 1-800-SUICIDE The National Suicide Prevention Lifeline: 1-800-273-TALK        08/18/2023   10:35 AM 07/08/2023    2:17 PM 06/03/2023    9:27 AM 04/29/2023   10:35 AM 03/05/2023    4:18 PM  Depression screen PHQ 2/9  Decreased Interest 0  0    Down, Depressed, Hopeless 0  0    PHQ - 2 Score 0  0    Altered sleeping   0    Tired, decreased energy   1    Change in appetite   0    Feeling bad or failure about yourself    0    Trouble concentrating   0    Moving slowly or fidgety/restless   0    Suicidal thoughts   0    PHQ-9 Score   1    Difficult doing work/chores   Not difficult at all  Information is confidential and restricted. Go to Review Flowsheets to unlock data.   Follow up:  Patient agrees to Care Plan and Follow-up.  Plan: The Managed Medicaid care management team will reach out to the patient again over the next 30 days. [facility] RNCM will follow up on 09/20/25 at 3:15 pm.  Dickie La, BSW, MSW, LCSW Managed Medicaid LCSW [facility]  [facility] [email or web address] Phone: [phone]

## 2023-09-07 NOTE — Patient Instructions (Signed)
Visit Information  [name] was given information about Medicaid Managed Care team care coordination services as a part of their [facility] Medicaid benefit. [name] verbally consented to engagement with the [facility] Managed Care team.   If you are experiencing a medical emergency, please call 911 or report to your local emergency department or urgent care.   If you have a non-emergency medical problem during routine business hours, please contact your provider's office and ask to speak with a nurse.   For questions related to your [facility] health plan, please call: [phone] or go here:[email or web address]  If you would like to schedule transportation through your [facility] plan, please call the following number at least 2 days in advance of your appointment: [phone].   You can also use the MTM portal or MTM mobile app to manage your rides. Reimbursement for transportation is available through [facility] For the portal, please go to [email or web address]  Call the [facility] Crisis Line at [phone], at any time, 24 hours a day, 7 days a week. If you are in danger or need immediate medical attention call 911.  If you would like help to quit smoking, call 1-800-QUIT-NOW ((726)356-6848) OR Espaol: 1-855-Djelo-Ya (7-425-956-3875) o para ms informacin haga clic aqu or Text READY to 643-329 to register via text  Following is a copy of your plan of care:  Care Plan : LCSW Plan of Care  Updates made by [name] Bryant, LCSW since 09/07/2023 12:00 AM     Problem: Depression Identification (Depression)      Long-Range Goal: Depressive Symptoms Identified   Start Date: 04/17/2022  Recent Progress: On track  Note:   Long-Range Goal: I want to start mental health treatment    Start Date: 04/17/2022  Priority: High  Note:   Priority: High   Timeframe:  Long-Range Goal Priority:  High Start Date:   04/17/22                Expected End Date:   09/07/23                Follow Up Date-- Social work discharge completed successfully on 09/07/23   Current barriers:   Chronic Mental Health needs related to depression, stress and anxiety. Patient requires Support, Education, Resources, Referrals, Advocacy, and Care Coordination, in order to meet Unmet Mental Health Needs. Patient will implement clinical interventions discussed today to decrease symptoms of depression and increase knowledge and/or ability of: coping skills. Mental Health Concerns and Social Isolation Patient lacks knowledge of available community counseling agencies and resources.   Clinical Goal(s): verbalize understanding of plan for management of Anxiety, Depression, and Stress and demonstrate a reduction in symptoms. Patient will connect with a provider for ongoing mental health treatment, increase coping skills, healthy habits, self-management skills, and stress reduction        Recommended Patient activities: Attend scheduled medical appointments Utilize healthy coping skills and supportive resources discussed Contact PCP with any questions or concerns Keep 90 percent of counseling appointments Call your insurance provider for more information about your Enhanced Benefits  Check out counseling resources provided  Accept all calls from representative with [facility] in an effort to establish ongoing mental health counseling and supportive services. Incorporate into daily practice - relaxation techniques, deep breathing exercises, and mindfulness meditation strategies. Talk about feelings with friends, family members, spiritual advisor, etc. Contact LCSW directly [phone]), if you have questions, need assistance, or if additional social work needs are identified between now and our  next scheduled telephone outreach call. Call 988 for mental health hotline/crisis line if needed (24/7 available) Try techniques to reduce symptoms of anxiety/negative thinking (deep breathing,  distraction, positive self talk, etc)  - develop a personal safety plan - develop a plan to deal with triggers like holidays, anniversaries - exercise at least 2 to 3 times per week - have a plan for how to handle bad days - journal feelings and what helps to feel better or worse - spend time or talk with others at least 2 to 3 times per week - watch for early signs of feeling worse - begin personal counseling - call and visit an old friend - check out volunteer opportunities - join a support group - laugh; watch a funny movie or comedian - learn and use visualization or guided imagery - perform a random act of kindness - practice relaxation or meditation daily - start or continue a personal journal - practice positive thinking and self-talk -continue with compliance of taking medication       24- Hour Availability:    [facility]  [address] [place], [place] [phone] Crisis [phone]   [facility] [phone]  [place] [facility]  [phone]    [facility]  [phone] (after hours)   Therapeutic Alternative/Mobile Crisis   [phone]   [place] National Suicide Hotline  (415)276-5228 [place]) [place] [address]   Call [phone] for mental health emergencies   [facility]  [phone]);  [place] and [employer]  [phone]); [place], [place], [place], [place], [place], [place], [place]    [place] [facility] for [facility] Residents For 24/7 walk-up access to mental health services for [facility] children (4+), adolescents and adults, please visit the [facility] located at [address] in [place], [place].  *[place] also provides comprehensive outpatient behavioral health services in a variety of locations around the [place].  Connect With [place] [address] [place], [place]  [address] HelpLine: (309)458-6703 or 1-(626)635-8472  Get Directions  Find Help 24/7 By Phone Call our 24-hour HelpLine at 905-363-9824 or 317-251-0831 for immediate assistance for mental health and substance abuse issues.  Walk-In Help [place]: [facility] (Ages 4 and Up) [place]: Emergency Dept., [facility] Additional Resources National Hopeline Network: 1-800-SUICIDE The National Suicide Prevention Lifeline: 1-800-273-TALK     [name], BSW, MSW, LCSW Managed Medicaid LCSW [facility]  [facility] [email or web address] Phone: [phone]

## 2023-09-08 NOTE — Pre-Procedure Instructions (Signed)
Surgical Instructions   Your procedure is scheduled on September 15, 2023. Report to [facility] Main Entrance "A" at 5:30 A.M., then check in with the Admitting office. Any questions or running late day of surgery: call [phone]  Questions prior to your surgery date: call [phone], Monday-Friday, 8am-4pm. If you experience any cold or flu symptoms such as cough, fever, chills, shortness of breath, etc. between now and your scheduled surgery, please notify us at the above number.     Remember:  Do not eat after midnight the night before your surgery  You may drink clear liquids until 4:30 AM the morning of your surgery.   Clear liquids allowed are: Water, Non-Citrus Juices (without pulp), Carbonated Beverages, Clear Tea, Black Coffee Only (NO MILK, CREAM OR POWDERED CREAMER of any kind), and Gatorade.  Patient Instructions  The night before surgery:  No food after midnight. ONLY clear liquids after midnight  The day of surgery (if you have diabetes): Drink ONE (1) 12 oz G2 given to you in your pre admission testing appointment by 4:30 AM the morning of surgery. Drink in one sitting. Do not sip.  This drink was given to you during your hospital  pre-op appointment visit.  Nothing else to drink after completing the  12 oz bottle of G2.         If you have questions, please contact your surgeon's office.     Take these medicines the morning of surgery with A SIP OF WATER: amLODipine (NORVASC)  atorvastatin (LIPITOR)  busPIRone (BUSPAR)  DULoxetine (CYMBALTA)  exemestane (AROMASIN)  Fluticasone-Umeclidin-Vilant (TRELEGY ELLIPTA) inhaler gabapentin (NEURONTIN)  metoprolol succinate (TOPROL-XL)    May take these medicines IF NEEDED: acetaminophen (TYLENOL)  VENTOLIN HFA  inhaler    Follow your surgeon's instructions on when to stop clopidogrel (PLAVIX).  If no instructions were given by your surgeon then you will need to call the office to get those instructions.      One week prior to surgery, STOP taking any Aspirin (unless otherwise instructed by your surgeon) Aleve, Naproxen, Ibuprofen, Motrin, Advil, Goody's, BC's, all herbal medications, fish oil, and non-prescription vitamins.   WHAT DO I DO ABOUT MY DIABETES MEDICATION?   Do not take metFORMIN (GLUCOPHAGE-XR) the morning of surgery.  STOP taking your Semaglutide one week prior to surgery. Do not take any doses after September 9th.       HOW TO MANAGE YOUR DIABETES BEFORE AND AFTER SURGERY  Why is it important to control my blood sugar before and after surgery? Improving blood sugar levels before and after surgery helps healing and can limit problems. A way of improving blood sugar control is eating a healthy diet by:  Eating less sugar and carbohydrates  Increasing activity/exercise  Talking with your doctor about reaching your blood sugar goals High blood sugars (greater than 180 mg/dL) can raise your risk of infections and slow your recovery, so you will need to focus on controlling your diabetes during the weeks before surgery. Make sure that the doctor who takes care of your diabetes knows about your planned surgery including the date and location.  How do I manage my blood sugar before surgery? Check your blood sugar at least 4 times a day, starting 2 days before surgery, to make sure that the level is not too high or low.  Check your blood sugar the morning of your surgery when you wake up and every 2 hours until you get to the Short Stay unit.  If your  blood sugar is less than 70 mg/dL, you will need to treat for low blood sugar: Do not take insulin. Treat a low blood sugar (less than 70 mg/dL) with  cup of clear juice (cranberry or apple), 4 glucose tablets, OR glucose gel. Recheck blood sugar in 15 minutes after treatment (to make sure it is greater than 70 mg/dL). If your blood sugar is not greater than 70 mg/dL on recheck, call [phone] for further instructions. Report  your blood sugar to the short stay nurse when you get to Short Stay.  If you are admitted to the hospital after surgery: Your blood sugar will be checked by the staff and you will probably be given insulin after surgery (instead of oral diabetes medicines) to make sure you have good blood sugar levels. The goal for blood sugar control after surgery is 80-180 mg/dL.                      Do NOT Smoke (Tobacco/Vaping) for 24 hours prior to your procedure.  If you use a CPAP at night, you may bring your mask/headgear for your overnight stay.   You will be asked to remove any contacts, glasses, piercing's, hearing aid's, dentures/partials prior to surgery. Please bring cases for these items if needed.    Patients discharged the day of surgery will not be allowed to drive home, and someone needs to stay with them for 24 hours.  SURGICAL WAITING ROOM VISITATION Patients may have no more than 2 support people in the waiting area - these visitors may rotate.   Pre-op nurse will coordinate an appropriate time for 1 ADULT support person, who may not rotate, to accompany patient in pre-op.  Children under the age of 66 must have an adult with them who is not the patient and must remain in the main waiting area with an adult.  If the patient needs to stay at the hospital during part of their recovery, the visitor guidelines for inpatient rooms apply.  Please refer to the [facility] website for the visitor guidelines for any additional information.   If you received a COVID test during your pre-op visit  it is requested that you wear a mask when out in public, stay away from anyone that may not be feeling well and notify your surgeon if you develop symptoms. If you have been in contact with anyone that has tested positive in the last 10 days please notify you surgeon.      Pre-operative 5 CHG Bathing Instructions   You can play a key role in reducing the risk of infection after surgery. Your skin  needs to be as free of germs as possible. You can reduce the number of germs on your skin by washing with CHG (chlorhexidine gluconate) soap before surgery. CHG is an antiseptic soap that kills germs and continues to kill germs even after washing.   DO NOT use if you have an allergy to chlorhexidine/CHG or antibacterial soaps. If your skin becomes reddened or irritated, stop using the CHG and notify one of our RNs at [phone].   Please shower with the CHG soap starting 4 days before surgery using the following schedule:     Please keep in mind the following:  DO NOT shave, including legs and underarms, starting the day of your first shower.   You may shave your face at any point before/day of surgery.  Place clean sheets on your bed the day you start using CHG  soap. Use a clean washcloth (not used since being washed) for each shower. DO NOT sleep with pets once you start using the CHG.   CHG Shower Instructions:  If you choose to wash your hair and private area, wash first with your normal shampoo/soap.  After you use shampoo/soap, rinse your hair and body thoroughly to remove shampoo/soap residue.  Turn the water OFF and apply about 3 tablespoons (45 ml) of CHG soap to a CLEAN washcloth.  Apply CHG soap ONLY FROM YOUR NECK DOWN TO YOUR TOES (washing for 3-5 minutes)  DO NOT use CHG soap on face, private areas, open wounds, or sores.  Pay special attention to the area where your surgery is being performed.  If you are having back surgery, having someone wash your back for you may be helpful. Wait 2 minutes after CHG soap is applied, then you may rinse off the CHG soap.  Pat dry with a clean towel  Put on clean clothes/pajamas   If you choose to wear lotion, please use ONLY the CHG-compatible lotions on the back of this paper.   Additional instructions for the day of surgery: DO NOT APPLY any lotions, deodorants, cologne, or perfumes.   Do not bring valuables to the hospital. [facility] is not responsible for any belongings/valuables. Do not wear nail polish, gel polish, artificial nails, or any other type of covering on natural nails (fingers and toes) Do not wear jewelry or makeup Put on clean/comfortable clothes.  Please brush your teeth.  Ask your nurse before applying any prescription medications to the skin.     CHG Compatible Lotions   Aveeno Moisturizing lotion  Cetaphil Moisturizing Cream  Cetaphil Moisturizing Lotion  Clairol Herbal Essence Moisturizing Lotion, Dry Skin  Clairol Herbal Essence Moisturizing Lotion, Extra Dry Skin  Clairol Herbal Essence Moisturizing Lotion, Normal Skin  Curel Age Defying Therapeutic Moisturizing Lotion with Alpha Hydroxy  Curel Extreme Care Body Lotion  Curel Soothing Hands Moisturizing Hand Lotion  Curel Therapeutic Moisturizing Cream, Fragrance-Free  Curel Therapeutic Moisturizing Lotion, Fragrance-Free  Curel Therapeutic Moisturizing Lotion, Original Formula  Eucerin Daily Replenishing Lotion  Eucerin Dry Skin Therapy Plus Alpha Hydroxy Crme  Eucerin Dry Skin Therapy Plus Alpha Hydroxy Lotion  Eucerin Original Crme  Eucerin Original Lotion  Eucerin Plus Crme Eucerin Plus Lotion  Eucerin TriLipid Replenishing Lotion  Keri Anti-Bacterial Hand Lotion  Keri Deep Conditioning Original Lotion Dry Skin Formula Softly Scented  Keri Deep Conditioning Original Lotion, Fragrance Free Sensitive Skin Formula  Keri Lotion Fast Absorbing Fragrance Free Sensitive Skin Formula  Keri Lotion Fast Absorbing Softly Scented Dry Skin Formula  Keri Original Lotion  Keri Skin Renewal Lotion Keri Silky Smooth Lotion  Keri Silky Smooth Sensitive Skin Lotion  Nivea Body Creamy Conditioning Oil  Nivea Body Extra Enriched Lotion  Nivea Body Original Lotion  Nivea Body Sheer Moisturizing Lotion Nivea Crme  Nivea Skin Firming Lotion  NutraDerm 30 Skin Lotion  NutraDerm Skin Lotion  NutraDerm Therapeutic Skin Cream  NutraDerm  Therapeutic Skin Lotion  ProShield Protective Hand Cream  Provon moisturizing lotion  Please read over the following fact sheets that you were given.

## 2023-09-09 ENCOUNTER — Other Ambulatory Visit: Payer: Self-pay

## 2023-09-09 ENCOUNTER — Encounter (HOSPITAL_COMMUNITY): Payer: Self-pay

## 2023-09-09 ENCOUNTER — Encounter (HOSPITAL_COMMUNITY)
Admission: RE | Admit: 2023-09-09 | Discharge: 2023-09-09 | Disposition: A | Discharge status: Home / Self Care | Payer: Medicaid Other | Source: Ambulatory Visit | Attending: Orthopedic Surgery

## 2023-09-09 VITALS — BP 134/65 | HR 81 | Resp 19 | Ht 61.0 in | Wt 294.9 lb

## 2023-09-09 DIAGNOSIS — Z01812 Encounter for preprocedural laboratory examination: Secondary | ICD-10-CM | POA: Insufficient documentation

## 2023-09-09 DIAGNOSIS — Z01818 Encounter for other preprocedural examination: Secondary | ICD-10-CM

## 2023-09-09 LAB — CBC
HCT: 44.1 % (ref 36.0–46.0)
Hemoglobin: 14.1 g/dL (ref 12.0–15.0)
MCH: 27.6 pg (ref 26.0–34.0)
MCHC: 32 g/dL (ref 30.0–36.0)
MCV: 86.5 fL (ref 80.0–100.0)
Platelets: 244 10*3/uL (ref 150–400)
RBC: 5.1 MIL/uL (ref 3.87–5.11)
RDW: 13 % (ref 11.5–15.5)
WBC: 7.1 10*3/uL (ref 4.0–10.5)
nRBC: 0 % (ref 0.0–0.2)

## 2023-09-09 LAB — URINALYSIS, ROUTINE W REFLEX MICROSCOPIC
Bilirubin Urine: NEGATIVE
Glucose, UA: NEGATIVE mg/dL
Hgb urine dipstick: NEGATIVE
Ketones, ur: NEGATIVE mg/dL
Nitrite: POSITIVE — AB
Protein, ur: NEGATIVE mg/dL
Specific Gravity, Urine: 1.012 (ref 1.005–1.030)
WBC, UA: >50 WBC/hpf (ref 0–5)
pH: 6 (ref 5.0–8.0)

## 2023-09-09 LAB — BASIC METABOLIC PANEL
Anion gap: 15 (ref 5–15)
BUN: 13 mg/dL (ref 8–23)
CO2: 28 mmol/L (ref 22–32)
Calcium: 9.6 mg/dL (ref 8.9–10.3)
Chloride: 95 mmol/L — ABNORMAL LOW (ref 98–111)
Creatinine, Ser: 0.97 mg/dL (ref 0.44–1.00)
GFR, Estimated: >60 mL/min (ref >60)
Glucose, Bld: 146 mg/dL — ABNORMAL HIGH (ref 70–99)
Potassium: 3.3 mmol/L — ABNORMAL LOW (ref 3.5–5.1)
Sodium: 138 mmol/L (ref 135–145)

## 2023-09-09 LAB — SURGICAL PCR SCREEN
MRSA, PCR: NEGATIVE
Staphylococcus aureus: NEGATIVE

## 2023-09-09 LAB — GLUCOSE, CAPILLARY: Glucose-Capillary: 171 mg/dL — ABNORMAL HIGH (ref 70–99)

## 2023-09-09 NOTE — Progress Notes (Signed)
PCP - Dr. Arva Chafe Cardiologist - Dr. Verne Carrow - Last office visit 12/25/2022  PPM/ICD - Denies Device Orders - n/a Rep Notified - n/a  Chest x-ray - 11/16/2022 EKG - 11/16/2022 Stress Test - 01/01/2023 ECHO - 02/18/2023 Cardiac Cath - 2010 - Stent x1  Sleep Study - +OSA but was not prescribed a CPAP. She used to sleep with 3L [place] oxygen, but no longer wears that.   Pt is DM2. She has a Libre3 on her Left Arm today. Normal fasting range is 125-130. CBG at pre-op appointment 171 (attributes to having coffee with cream and sugar). Last A1c on 08/18/23 was 7.0. Surgeon ordered another A1c to be drawn today. Result pending.  Last dose of GLP1 agonist- Pts last dose of Ozempic was September 7th. GLP1 instructions: Instructed to continue to hold medication until after surgery  Blood Thinner Instructions: Pt has already stopped her Plavix on September 10th preparing for upcoming surgery. Aspirin Instructions: n/a  ERAS Protcol - Clear liquids until 0430 morning of surgery PRE-SURGERY Ensure or G2- G2 given to pt with instructions  COVID TEST- n/a   Anesthesia review: Yes. Pt is pending cardiac clearance via televisit on September 13th. Pt instructed to notify cardiology that she has already stopped her Plavix. 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 Also has hx of OSA, DM2, Heart murmur with TAVR, MI/CAD with stent placement and heart failure.   Patient denies shortness of breath, fever, cough and chest pain at PAT appointment. Pt denies any respiratory illness/infection in the last two months.   All instructions explained to the patient, with a verbal understanding of the material. Patient agrees to go over the instructions while at home for a better understanding. Patient also instructed to self quarantine after being tested for COVID-19. The opportunity to ask questions was provided.

## 2023-09-09 NOTE — Progress Notes (Signed)
[name] thanks for ordering urine culture on patient today.  Let me know what that shows.

## 2023-09-10 LAB — HEMOGLOBIN A1C
Hgb A1c MFr Bld: 7.1 % — ABNORMAL HIGH (ref 4.8–5.6)
Mean Plasma Glucose: 157 mg/dL

## 2023-09-10 NOTE — Progress Notes (Addendum)
Anesthesia Chart Review:  Case: [id number] Date/Time: 09/15/23 0715   Procedures:      REVERSE SHOULDER ARTHROPLASTY (Right: Shoulder)     BICEPS TENODESIS (Right: Shoulder)   Anesthesia type: General   Pre-op diagnosis: right shoulder osteoarthritis, biceps tendinitis   [facility] / [facility]   Surgeons: Cammy Copa, MD       DISCUSSION: Patient is a 62 year old female scheduled for the above procedure.  History includes former smoker, asthma, COPD, HTN, HLD, DM2, aortic stenosis (s/p TAVR, 26mm 12/12/14), mitral stenosis (mild -moderate 02/18/23 echo), 1VCAD (DES dRCA 02/08/09; ~ 70% ostial RCA with patent dRCA stent 09/14/14, medically managed), chronic diastolic CHF, OSA (does not use CPAP, O2 3L/night as needed), GERD, hiatal hernia, breast cancer (s/p right breast lumpectomy 07/19/18 for stage 1B IDC, s/p radiation), anemia, spinal surgery (L4-5 PLIF 02/11/21), ventral hernia (s/p repair for incarceration 12/28/01), osteoarthritis (right TKA 02/03/12, complicated by post-op PE and HCAP 02/08/12 with revision 07/15/13; TKA 09/10/12; left reverse TSA 03/06/22), nephrolithiasis, morbid obesity.   She is followed by cardiologist Dr. Clifton James for RCA stent in 2010 and TAVR in 2015. Echo on 02/18/23 showed LVEF 60-65%, no regional wall motion abnormalities, mild LVH, grade 1 diastolic dysfunction, normal RV systolic function, mildly increased RV wall thickness, trivial MR, mild to moderate MS, mean MV gradient 8 mmHg with average heart rate 82 bpm, severe mitral annular calcification, 26 mm Edwards SAPIEN prosthetic TAVR in the aortic position, TAVR mean gradient 15 mmHg. No ischemic noted on 01/01/23 nuclear stress test.   She had a cardiology telephonic preoperative evaluation by Bernadene Person, NP on 09/11/23. She wrote: "According to the Revised Cardiac Risk Index (RCRI), her Perioperative Risk of Major Cardiac Event is (%): 0.9. Her Functional Capacity in METs is: 4.4 according to the [facility]  Activity Status Index (DASI).Therefore, based on ACC/AHA guidelines, patient would be at acceptable risk for the planned procedure without further cardiovascular testing... Per office protocol, she may hold Plavix for 5 days prior to procedure. Please resume Plavix as soon as possible postprocedure, at the discretion of the surgeon. " Patient reported holding Plavix since 09/08/23.   A1c 7.1%. She wears a Freight forwarder. She is on metformin and Ozempic. Last Ozempic 08/05/23 and will hold until after surgery.   Ortho APP added urine culture. I spoke with [facility] lab personnel on 09/10/23 and 09/11/23 since result is still pending. Apparently, they are unable to locate the specimen to add on the culture. I will notify Dr. Diamantina Providence staff. Defer additional orders to surgeon.  Anesthesia team to evaluate on the day of surgery.    VS: BP 134/65   Pulse 81   Resp 19   Ht 5\' 1"  (1.549 m)   Wt 133.8 kg   LMP 02/03/2012   SpO2 96%   BMI 55.72 kg/m   PROVIDERS: Philomena Doheny, MD is PCP  Kathleene Hazel, MD is Cardiologist  Rachel Moulds, MD is HEM-ONC    LABS: Preoperative labs noted. Lab unable to locate UA specimen to add urine culture.  LFTs normal on 08/18/23.  (all labs ordered are listed, but only abnormal results are displayed)  Labs Reviewed  GLUCOSE, CAPILLARY - Abnormal; Notable for the following components:      Result Value   Glucose-Capillary 171 (*)    All other components within normal limits  HEMOGLOBIN A1C - Abnormal; Notable for the following components:   Hgb A1c MFr Bld 7.1 (*)  All other components within normal limits  BASIC METABOLIC PANEL - Abnormal; Notable for the following components:   Potassium 3.3 (*)    Chloride 95 (*)    Glucose, Bld 146 (*)    All other components within normal limits  URINALYSIS, ROUTINE W REFLEX MICROSCOPIC - Abnormal; Notable for the following components:   APPearance CLOUDY (*)    Nitrite POSITIVE (*)    Leukocytes,Ua  LARGE (*)    Bacteria, UA RARE (*)    All other components within normal limits  SURGICAL PCR SCREEN  URINE CULTURE  CBC    Nocturnal Polysomnogram 08/05/13: IMPRESSION/RECOMMENDATIONS: 1. Minimal obstructive sleep apnea/hypopnea syndrome, with an AHI of 5 events per hour and oxygen desaturation as low as 85%.  It is unclear how much this may be impacting her sleep and quality of life, but does not represent a significant increase in cardiovascular risk.  Treatment for this can include a trial of weight loss alone, upper airway surgery, dental appliance, also CPAP. Clinical correlation is suggested. 2. Mild oxygen desaturation as low as 85% associated with her events and also independent of her events.  Oxygen at 1 L/minute was added at 1:00 a.m. per [facility] protocol. 3. The patient was noted to have large numbers of leg jerks, primarily at the beginning of the study, however, they were not periodic in nature and resulted in very little sleep disruption.  I suspect this is not related to a primary movement disorder of sleep.    IMAGES: CT Right Shoulder 08/12/23: IMPRESSION: 1. Severe right glenohumeral joint osteoarthritis. 2. Mild acromioclavicular joint osteoarthritis.  CT Chest/Abd/Pelvis 11/16/22: IMPRESSION: 1. Obstructing 4 mm right ureteropelvic junction stone with associated mild right hydronephrosis. 2. Punctate nonobstructing left nephrolithiasis. No left hydronephrosis. 3. Subtle mosaic attenuation of the bilateral lungs, likely due to air trapping, which could reflect small airways disease. No pulmonary edema or pleural effusion. 4. Anterior abdominal mesh hernia repair with moderate-sized fat-containing ventral midline abdominal hernias along the superior margin of the hernia mesh repair and two additional fat-containing midline abdominal hernias inferior to the mesh repair, 1 small, 1 moderate. 5. Coronary artery calcifications. Aortic  Atherosclerosis (ICD10-I70.0).   EKG: 11/16/22: Sinus tachycardia at 104 bpm Borderline prolonged PR interval Low voltage, precordial leads No significant change since last tracing Confirmed by Gwyneth Sprout ([id number]) on 11/16/2022 9:40:36 AM   CV: Echo 02/18/23: IMPRESSIONS   1. Left ventricular ejection fraction, by estimation, is 60 to 65%. Left  ventricular ejection fraction by 3D volume is 61 %. The left ventricle has  normal function. The left ventricle has no regional wall motion  abnormalities. There is mild left  ventricular hypertrophy. Left ventricular diastolic parameters are  consistent with Grade I diastolic dysfunction (impaired relaxation).  Elevated left ventricular end-diastolic pressure. The E/e' is 29.   2. Right ventricular systolic function is normal. The right ventricular  size is normal. Mildly increased right ventricular wall thickness.  Tricuspid regurgitation signal is inadequate for assessing PA pressure.   3. The mitral valve is abnormal. Trivial mitral valve regurgitation. Mild  to moderate mitral stenosis. The mean mitral valve gradient is 8.0 mmHg  with average heart rate of 82 bpm. Severe mitral annular calcification.   4. The aortic valve has been repaired/replaced. Aortic valve  regurgitation is not visualized. There is a 26 mm Edwards Sapien  prosthetic (TAVR) valve present in the aortic position. Procedure Date:  12/12/2014. Aortic valve area, by VTI measures 1.71  cm. Aortic valve  mean gradient measures 11.0 mmHg. Aortic valve Vmax  measures 2.19 m/s. Peak gradient 19.2 mmHg, DI is 0.45.   5. The inferior vena cava is normal in size with greater than 50%  respiratory variability, suggesting right atrial pressure of 3 mmHg.  - Comparison(s): Changes from prior study are noted. 01/23/2021: LVEF 60-65%,  TAVR mean gradient 15 mmHg.    Nuclear stress test 01/01/23:   Normal blood pressure and normal heart rate response noted during stress.  Heart rate recovery was normal.   No ST deviation was noted. The ECG was not diagnostic due to pharmacologic protocol.   Breast attenuation artifact was present. Image quality affected due to significant extracardiac activity.   LV perfusion is normal. There is no evidence of ischemia. There is no evidence of infarction.   Left ventricular function is normal. Nuclear stress EF: 60 %. The left ventricular ejection fraction is normal (55-65%). End diastolic cavity size is normal. End systolic cavity size is normal.   The study is normal. The study is low risk.    RHC/LHC 09/14/14: - LM: No obstructive disease. - LAD: Moderate caliber vessel that courses to the apex.  Moderate caliber diagonal branch.  No obstructive disease noted. - CX: Large caliber vessel with 2 small OM branches and a moderate caliber PL branch.  No obstructive disease noted. - RCA: Large dominant vessel with 70 to 80% ostial stenosis.  40% proximal stenosis.  Distal stented segment is patent with no restenosis. Impression: 1. Single vessel CAD with ostial RCA stenosis, patent distal RCA stent 2. Severe aortic valve stenosis 3. Morbid obesity   Recommendations: She has progression of her CAD with ostial RCA stenosis and severe AS. She will need AVR and bypass of RCA. I will refer her to see one of our CT surgeons. She will be high risk for traditional AVR due to size. May have to consider TAVR/PCI of RCA.    - RCA disease managed medically. - S/p Transcatheter Aortic Valve Replacement - Transfemoral Approach             Edwards Sapien 3 THV (size 26 mm, model # 9600TFX, serial # [id number]) 12/12/14.   Past Medical History:  Diagnosis Date   Anemia    Anxiety    Aortic valve stenosis, severe    Arthritis    PAIN AND SEVERE OA LEFT KNEE ; S/P RIGHT TKA ON 02/03/12; HAS LOWER BACK PAIN-UNABLE TO STAND MORE THAN 10 MIN; ARTHRITIS "ALL OVER"   Asthma    Blood transfusion    [facility]   Breast cancer in female [facility])     Right   CAD (coronary artery disease)    Cath 2010 with DES x 1 RCA-- PT'S CARDIOLOGIST IS DR. MCALHANY   Chronic diastolic congestive heart failure (HCC)    COPD (chronic obstructive pulmonary disease) (HCC)    Mild   COPD exacerbation (HCC) 11/16/2022   Depression    Diabetes mellitus DIAGNOSED IN2010   Dyspnea    with much ambulation   Eczema    on back   Headache    migraines younger- rare now 02/07/21   Heart murmur    no current problems   History of hiatal hernia    History of kidney stones    passed or blasted   History of UTI 12/05/2022   Hyperlipidemia    Hypertension    Morbid obesity with body mass index of 60.0-69.9 in adult [facility])    Myocardial infarction (HCC)  PT THINKS SHE WAS DX WITH MI AT THE TIME OF HEART STENTING   Neuromuscular disorder (HCC)    bilateral arm/hands   Oxygen dependent    uses 3L oxygen night/prn   Personal history of radiation therapy    Pneumonia    Pulmonary embolism (HCC) 02/08/2012   S/P RT TOTAL KNEE ON 02/03/12--ON 02/08/12--DEVELOPED ACUTE SOB AND CHEST PAIN--AND DIAGNOSED WITH  PULMONARY EMBOLUS AND PNEUMONIA   Restless leg syndrome    Sleep apnea    Uterine fibroid    NO PROBLEMS AT PRESENT FROM THE FIBROIDS-STATES SHE IS POST MENOPAUSAL-LAST MENSES 2010 EXCEPT FOR EPISODE THIS YR OF BLEEDING RELATED TO FIBROIDS.   Weakness    BOTH HANDS - S/P BILATERAL CARPAL TUNNEL RELEASE--BUT STILL HAS WEAKNESS--OFTEN DROPS THINGS    Past Surgical History:  Procedure Laterality Date   BACK SURGERY  02/11/2021   Dr Otelia Sergeant   BREAST BIOPSY Right 06/04/2018   BREAST LUMPECTOMY Right 06/2018   BREAST LUMPECTOMY WITH RADIOACTIVE SEED AND SENTINEL LYMPH NODE BIOPSY Right 07/19/2018   Procedure: BREAST LUMPECTOMY WITH RADIOACTIVE SEED AND SENTINEL LYMPH NODE BIOPSY;  Surgeon: Ovidio Kin, MD;  [facility];  Service: General;  Laterality: Right;   CARPAL TUNNEL RELEASE     Bilateral   CHOLECYSTECTOMY     COLONOSCOPY  2010   CORONARY  ANGIOPLASTY     2010 has stent in place   CYSTOSCOPY W/ RETROGRADES Right 09/21/2013   Procedure: CYSTOSCOPY WITH RIGHT RETROGRADE PYELOGRAM RIGHT DOUBLE J STENT ;  Surgeon: Antony Haste, MD;  Location: [facility];  Service: Urology;  Laterality: Right;   CYSTOSCOPY W/ URETERAL STENT PLACEMENT Right 11/16/2022   Procedure: CYSTOSCOPY WITH RETROGRADE PYELOGRAM/URETERAL STENT PLACEMENT;  Surgeon: Despina Arias, MD;  [facility];  Service: Urology;  Laterality: Right;   CYSTOSCOPY WITH RETROGRADE PYELOGRAM, URETEROSCOPY AND STENT PLACEMENT Right 01/13/2023   Procedure: CYSTOSCOPY WITH RETROGRADE PYELOGRAM, URETEROSCOPY AND STENT EXCHANGE;  Surgeon: Milderd Meager., MD;  Location: [facility];  Service: Urology;  Laterality: Right;   CYSTOSCOPY WITH URETEROSCOPY AND STENT PLACEMENT Right 10/25/2013   Procedure: CYSTOSCOPY RIGHT URETEROSCOPY HOLMIUM LASER LITHO AND STENT PLACEMENT;  Surgeon: Antony Haste, MD;  Location: [facility];  Service: Urology;  Laterality: Right;   HERNIA REPAIR  1995   umbilical ,   HOLMIUM LASER APPLICATION Right 01/13/2023   Procedure: HOLMIUM LASER APPLICATION;  Surgeon: Milderd Meager., MD;  Location: [facility];  Service: Urology;  Laterality: Right;   INTRAOPERATIVE TRANSESOPHAGEAL ECHOCARDIOGRAM N/A 12/12/2014   Procedure: INTRAOPERATIVE TRANSESOPHAGEAL ECHOCARDIOGRAM;  Surgeon: Kathleene Hazel, MD;  Location[facility] OR;  Service: Cardiovascular;  Laterality: N/A;   KNEE ARTHROPLASTY  02/03/2012   Procedure: COMPUTER ASSISTED TOTAL KNEE ARTHROPLASTY;  Surgeon: [name], MD;  [facility];  Service: Orthopedics;  Laterality: Right;  Right total knee arthroplasty   LEFT AND RIGHT HEART CATHETERIZATION WITH CORONARY ANGIOGRAM N/A 03/17/2013   Procedure: LEFT AND RIGHT HEART CATHETERIZATION WITH CORONARY ANGIOGRAM;  Surgeon: Kathleene Hazel, MD;  Location: [facility] CATH LAB;  Service: Cardiovascular;  Laterality: N/A;   LEFT AND  RIGHT HEART CATHETERIZATION WITH CORONARY/GRAFT ANGIOGRAM N/A 09/14/2014   Procedure: LEFT AND RIGHT HEART CATHETERIZATION WITH Isabel Caprice;  Surgeon: Kathleene Hazel, MD;  Location: [facility] CATH LAB;  Service: Cardiovascular;  Laterality: N/A;   REVERSE SHOULDER ARTHROPLASTY Left 03/06/2022   Procedure: LEFT SHOULDER REPLACEMENT APPLICATION OF WOUND VAC;  Surgeon: Cammy Copa, MD;  [facility];  Service: Orthopedics;  Laterality: Left;   TEE WITHOUT CARDIOVERSION N/A 03/14/2013   Procedure: TRANSESOPHAGEAL ECHOCARDIOGRAM (TEE);  Surgeon: Lewayne Bunting, MD;  Location: [facility] ENDOSCOPY;  Service: Cardiovascular;  Laterality: N/A;   TEE WITHOUT CARDIOVERSION N/A 11/14/2014   Procedure: TRANSESOPHAGEAL ECHOCARDIOGRAM (TEE);  Surgeon: Lewayne Bunting, MD;  Location: [facility] ENDOSCOPY;  Service: Cardiovascular;  Laterality: N/A;   TONSILLECTOMY     maybe as a child- does not know   TOTAL KNEE ARTHROPLASTY  09/10/2012   Procedure: TOTAL KNEE ARTHROPLASTY;  Surgeon: [name], MD;  [facility];  Service: Orthopedics;  Laterality: Left;   TOTAL KNEE REVISION Right 07/15/2013   Procedure: REVISION ARTHROPLASTY RIGHT KNEE;  Surgeon: [name], MD;  [facility];  Service: Orthopedics;  Laterality: Right;   TRANSCATHETER AORTIC VALVE REPLACEMENT, TRANSFEMORAL N/A 12/12/2014   Procedure: TRANSCATHETER AORTIC VALVE REPLACEMENT, TRANSFEMORAL;  Surgeon: Kathleene Hazel, MD;  [facility];  Service: Cardiovascular;  Laterality: N/A;   TRIGGER FINGER RELEASE  09/10/2012   Procedure: RELEASE TRIGGER FINGER/A-1 PULLEY;  Surgeon: [name], MD;  Location[facility];  Service: Orthopedics;  Laterality: Right;  Right Ring Finger   TUBAL LIGATION      MEDICATIONS:  acetaminophen (TYLENOL) 500 MG tablet   amLODipine (NORVASC) 5 MG tablet   atorvastatin (LIPITOR) 80 MG tablet   Blood Pressure Monitoring (BLOOD PRESSURE CUFF) MISC    busPIRone (BUSPAR) 15 MG tablet   cholecalciferol (VITAMIN D3) 25 MCG (1000 UNIT) tablet   clopidogrel (PLAVIX) 75 MG tablet   Continuous Glucose Sensor (FREESTYLE LIBRE 3 SENSOR) MISC   Continuous Glucose Sensor (FREESTYLE LIBRE 3 SENSOR) MISC   DULoxetine (CYMBALTA) 20 MG capsule   Evolocumab (REPATHA SURECLICK) 140 MG/ML SOAJ   exemestane (AROMASIN) 25 MG tablet   ezetimibe (ZETIA) 10 MG tablet   Fluticasone-Umeclidin-Vilant (TRELEGY ELLIPTA) 100-62.5-25 MCG/ACT AEPB   furosemide (LASIX) 40 MG tablet   gabapentin (NEURONTIN) 300 MG capsule   hydrocortisone cream 1 %   losartan-hydrochlorothiazide (HYZAAR) 100-25 MG tablet   metFORMIN (GLUCOPHAGE-XR) 500 MG 24 hr tablet   metoprolol succinate (TOPROL-XL) 50 MG 24 hr tablet   montelukast (SINGULAIR) 10 MG tablet   Semaglutide, 2 MG/DOSE, 8 MG/3ML SOPN   senna-docusate (SENNA-TIME S) 8.6-50 MG tablet   traZODone (DESYREL) 50 MG tablet   VENTOLIN HFA 108 (90 Base) MCG/ACT inhaler   No current facility-administered medications for this encounter.    Shonna Chock, PA-C Surgical Short Stay/Anesthesiology [facility] Phone [phone] [facility] Phone [phone] 09/11/2023 12:46 PM

## 2023-09-10 NOTE — Anesthesia Preprocedure Evaluation (Addendum)
Anesthesia Evaluation  Patient identified by MRN, date of birth, ID band Patient awake    Reviewed: Allergy & Precautions, H&P , NPO status , Patient's Chart, lab work & pertinent test results  Airway Mallampati: II  TM Distance: >3 FB Neck ROM: Full    Dental no notable dental hx. (+) Edentulous Upper, Edentulous Lower, Dental Advisory Given   Pulmonary asthma , sleep apnea , COPD,  COPD inhaler and oxygen dependent, former smoker   Pulmonary exam normal breath sounds clear to auscultation       Cardiovascular Exercise Tolerance: Good hypertension, Pt. on medications and Pt. on home beta blockers + CAD, + Past MI, + Cardiac Stents and +CHF   Rhythm:Regular Rate:Normal  Echo 02/18/23: IMPRESSIONS  1. Left ventricular ejection fraction, by estimation, is 60 to 65%. Left  ventricular ejection fraction by 3D volume is 61 %. The left ventricle has  normal function. The left ventricle has no regional wall motion  abnormalities. There is mild left  ventricular hypertrophy. Left ventricular diastolic parameters are  consistent with Grade I diastolic dysfunction (impaired relaxation).  Elevated left ventricular end-diastolic pressure. The E/e' is 29.  2. Right ventricular systolic function is normal. The right ventricular  size is normal. Mildly increased right ventricular wall thickness.  Tricuspid regurgitation signal is inadequate for assessing PA pressure.  3. The mitral valve is abnormal. Trivial mitral valve regurgitation. Mild  to moderate mitral stenosis. The mean mitral valve gradient is 8.0 mmHg  with average heart rate of 82 bpm. Severe mitral annular calcification.  4. The aortic valve has been repaired/replaced. Aortic valve  regurgitation is not visualized. There is a 26 mm Edwards Sapien  prosthetic (TAVR) valve present in the aortic position. Procedure Date:  12/12/2014. Aortic valve area, by VTI measures 1.71  cm.  Aortic valve mean gradient measures 11.0 mmHg. Aortic valve Vmax  measures 2.19 m/s. Peak gradient 19.2 mmHg, DI is 0.45.  5. The inferior vena cava is normal in size with greater than 50%  respiratory variability, suggesting right atrial pressure of 3 mmHg.  - Comparison(s): Changes from prior study are noted. 01/23/2021: LVEF 60-65%,  TAVR mean gradient 15 mmHg.    Nuclear stress test 01/01/23:   Normal blood pressure and normal heart rate response noted during stress. Heart rate recovery was normal.   No ST deviation was noted. The ECG was not diagnostic due to pharmacologic protocol.   Breast attenuation artifact was present. Image quality affected due to significant extracardiac activity.   LV perfusion is normal. There is no evidence of ischemia. There is no evidence of infarction.   Left ventricular function is normal. Nuclear stress EF: 60 %. The left ventricular ejection fraction is normal (55-65%). End diastolic cavity size is normal. End systolic cavity size is normal.   The study is normal. The study is low risk.     Neuro/Psych  Headaches  Anxiety Depression       GI/Hepatic Neg liver ROS, hiatal hernia,GERD  ,,  Endo/Other  diabetes, Type 2, Oral Hypoglycemic Agents  Morbid obesity  Renal/GU negative Renal ROS  negative genitourinary   Musculoskeletal  (+) Arthritis , Osteoarthritis,    Abdominal   Peds  Hematology  (+) Blood dyscrasia, anemia   Anesthesia Other Findings   Reproductive/Obstetrics negative OB ROS                             Anesthesia Physical Anesthesia Plan  ASA: 4  Anesthesia Plan: General   Post-op Pain Management: Regional block* and Tylenol PO (pre-op)*   Induction: Intravenous  PONV Risk Score and Plan: 4 or greater and Ondansetron, Dexamethasone and Midazolam  Airway Management Planned: Oral ETT  Additional Equipment:   Intra-op Plan:   Post-operative Plan: Extubation in OR  Informed  Consent: I have reviewed the patients History and Physical, chart, labs and discussed the procedure including the risks, benefits and alternatives for the proposed anesthesia with the patient or authorized representative who has indicated his/her understanding and acceptance.     Dental advisory given  Plan Discussed with: CRNA  Anesthesia Plan Comments: (PAT note written by Shonna Chock, PA-C.  )       Anesthesia Quick Evaluation

## 2023-09-11 ENCOUNTER — Other Ambulatory Visit: Payer: Self-pay | Admitting: Hematology and Oncology

## 2023-09-11 ENCOUNTER — Ambulatory Visit: Payer: Medicaid Other | Attending: Cardiology | Admitting: Nurse Practitioner

## 2023-09-11 ENCOUNTER — Encounter (HOSPITAL_COMMUNITY)
Admission: RE | Admit: 2023-09-11 | Discharge: 2023-09-11 | Disposition: A | Discharge status: Home / Self Care | Payer: Medicaid Other | Source: Ambulatory Visit | Attending: Orthopedic Surgery

## 2023-09-11 DIAGNOSIS — Z01812 Encounter for preprocedural laboratory examination: Secondary | ICD-10-CM | POA: Insufficient documentation

## 2023-09-11 DIAGNOSIS — C50411 Malignant neoplasm of upper-outer quadrant of right female breast: Secondary | ICD-10-CM

## 2023-09-11 DIAGNOSIS — Z01810 Encounter for preprocedural cardiovascular examination: Secondary | ICD-10-CM

## 2023-09-11 DIAGNOSIS — Z01818 Encounter for other preprocedural examination: Secondary | ICD-10-CM

## 2023-09-11 LAB — URINALYSIS, W/ REFLEX TO CULTURE (INFECTION SUSPECTED)
Bilirubin Urine: NEGATIVE
Glucose, UA: NEGATIVE mg/dL
Ketones, ur: NEGATIVE mg/dL
Nitrite: NEGATIVE
Protein, ur: NEGATIVE mg/dL
Specific Gravity, Urine: 1.025 (ref 1.005–1.030)
WBC, UA: >50 WBC/hpf (ref 0–5)
pH: 6 (ref 5.0–8.0)

## 2023-09-11 LAB — COLOGUARD: COLOGUARD: NEGATIVE

## 2023-09-11 NOTE — Progress Notes (Signed)
Virtual Visit via Telephone Note   Because of [name] co-morbid illnesses, she is at least at moderate risk for complications without adequate follow up.  This format is felt to be most appropriate for this patient at this time.  The patient did not have access to video technology/had technical difficulties with video requiring transitioning to audio format only (telephone).  All issues noted in this document were discussed and addressed.  No physical exam could be performed with this format.  Please refer to the patient's chart for her consent to telehealth for [facility].  Evaluation Performed:  Preoperative cardiovascular risk assessment _____________   Date:  09/11/2023   Patient ID:  [name], DOB Apr 05, 1961, MRN [id number] Patient Location:  Home Provider location:   Office  Primary Care Provider:  Colbert Coyer, Alyson Locket, MD Primary Cardiologist:  Verne Carrow, MD  Chief Complaint / Patient Profile   62 y.o. y/o female with a h/o severe aortic stenosis s/p TAVR in 2015, CAD s/p DES-RCA in 2010, hypertension, hyperlipidemia, OSA, GERD, asthma, obesity and former tobacco use who is pending RIGHT REVERSE SHOULDER ARTHROPLASTY on 09/15/2023 with Dr. Dorene Grebe of [facility] at [facility]  and presents today for telephonic preoperative cardiovascular risk assessment.  History of Present Illness    [name] is a 62 y.o. female who presents via audio/video conferencing for a telehealth visit today.  Pt was last seen in cardiology clinic on 12/25/2022 by Eligha Bridegroom, NP.  At that time [name] was doing well.  The patient is now pending procedure as outlined above. Nuclear stress test in 12/2022 in the setting of preoperative cardiac evaluation was low risk.  She was cleared for surgery.  Since her last visit, she has done well from a cardiac standpoint.   She denies chest pain, palpitations, dyspnea, pnd, orthopnea, n,  v, dizziness, syncope, edema, weight gain, or early satiety. All other systems reviewed and are otherwise negative except as noted above.   Past Medical History    Past Medical History:  Diagnosis Date   Anemia    Anxiety    Aortic valve stenosis, severe    Arthritis    PAIN AND SEVERE OA LEFT KNEE ; S/P RIGHT TKA ON 02/03/12; HAS LOWER BACK PAIN-UNABLE TO STAND MORE THAN 10 MIN; ARTHRITIS "ALL OVER"   Asthma    Blood transfusion    2013[facility]   Breast cancer in female [facility])    Right   CAD (coronary artery disease)    Cath 2010 with DES x 1 RCA-- PT'S CARDIOLOGIST IS DR. MCALHANY   Chronic diastolic congestive heart failure (HCC)    COPD (chronic obstructive pulmonary disease) (HCC)    Mild   COPD exacerbation (HCC) 11/16/2022   Depression    Diabetes mellitus DIAGNOSED IN2010   Dyspnea    with much ambulation   Eczema    on back   Headache    migraines younger- rare now 02/07/21   Heart murmur    no current problems   History of hiatal hernia    History of kidney stones    passed or blasted   History of UTI 12/05/2022   Hyperlipidemia    Hypertension    Morbid obesity with body mass index of 60.0-69.9 in adult [facility])    Myocardial infarction (HCC)    PT THINKS SHE WAS DX WITH MI AT THE TIME OF HEART STENTING   Neuromuscular disorder (HCC)    bilateral arm/hands  Oxygen dependent    uses 3L oxygen night/prn   Personal history of radiation therapy    Pneumonia    Pulmonary embolism (HCC) 02/08/2012   S/P RT TOTAL KNEE ON 02/03/12--ON 02/08/12--DEVELOPED ACUTE SOB AND CHEST PAIN--AND DIAGNOSED WITH  PULMONARY EMBOLUS AND PNEUMONIA   Restless leg syndrome    Sleep apnea    Uterine fibroid    NO PROBLEMS AT PRESENT FROM THE FIBROIDS-STATES SHE IS POST MENOPAUSAL-LAST MENSES 2010 EXCEPT FOR EPISODE THIS YR OF BLEEDING RELATED TO FIBROIDS.   Weakness    BOTH HANDS - S/P BILATERAL CARPAL TUNNEL RELEASE--BUT STILL HAS WEAKNESS--OFTEN DROPS THINGS   Past Surgical History:   Procedure Laterality Date   BACK SURGERY  02/11/2021   Dr Otelia Sergeant   BREAST BIOPSY Right 06/04/2018   BREAST LUMPECTOMY Right 06/2018   BREAST LUMPECTOMY WITH RADIOACTIVE SEED AND SENTINEL LYMPH NODE BIOPSY Right 07/19/2018   Procedure: BREAST LUMPECTOMY WITH RADIOACTIVE SEED AND SENTINEL LYMPH NODE BIOPSY;  Surgeon: Ovidio Kin, MD;  Location: [facility] OR;  Service: General;  Laterality: Right;   CARPAL TUNNEL RELEASE     Bilateral   CHOLECYSTECTOMY     COLONOSCOPY  2010   CORONARY ANGIOPLASTY     2010 has stent in place   CYSTOSCOPY W/ RETROGRADES Right 09/21/2013   Procedure: CYSTOSCOPY WITH RIGHT RETROGRADE PYELOGRAM RIGHT DOUBLE J STENT ;  Surgeon: Antony Haste, MD;  Location: [facility] ORS;  Service: Urology;  Laterality: Right;   CYSTOSCOPY W/ URETERAL STENT PLACEMENT Right 11/16/2022   Procedure: CYSTOSCOPY WITH RETROGRADE PYELOGRAM/URETERAL STENT PLACEMENT;  Surgeon: Despina Arias, MD;  Location: [facility] OR;  Service: Urology;  Laterality: Right;   CYSTOSCOPY WITH RETROGRADE PYELOGRAM, URETEROSCOPY AND STENT PLACEMENT Right 01/13/2023   Procedure: CYSTOSCOPY WITH RETROGRADE PYELOGRAM, URETEROSCOPY AND STENT EXCHANGE;  Surgeon: Milderd Meager., MD;  Location: [facility];  Service: Urology;  Laterality: Right;   CYSTOSCOPY WITH URETEROSCOPY AND STENT PLACEMENT Right 10/25/2013   Procedure: CYSTOSCOPY RIGHT URETEROSCOPY HOLMIUM LASER LITHO AND STENT PLACEMENT;  Surgeon: Antony Haste, MD;  Location: [facility];  Service: Urology;  Laterality: Right;   HERNIA REPAIR  1995   umbilical ,   HOLMIUM LASER APPLICATION Right 01/13/2023   Procedure: HOLMIUM LASER APPLICATION;  Surgeon: Milderd Meager., MD;  Location: [facility];  Service: Urology;  Laterality: Right;   INTRAOPERATIVE TRANSESOPHAGEAL ECHOCARDIOGRAM N/A 12/12/2014   Procedure: INTRAOPERATIVE TRANSESOPHAGEAL ECHOCARDIOGRAM;  Surgeon: Kathleene Hazel, MD;  Location: [facility] OR;  Service: Cardiovascular;  Laterality: N/A;    KNEE ARTHROPLASTY  02/03/2012   Procedure: COMPUTER ASSISTED TOTAL KNEE ARTHROPLASTY;  Surgeon: [name] Hitch, MD;  Location: [facility] OR;  Service: Orthopedics;  Laterality: Right;  Right total knee arthroplasty   LEFT AND RIGHT HEART CATHETERIZATION WITH CORONARY ANGIOGRAM N/A 03/17/2013   Procedure: LEFT AND RIGHT HEART CATHETERIZATION WITH CORONARY ANGIOGRAM;  Surgeon: Kathleene Hazel, MD;  Location: [facility] CATH LAB;  Service: Cardiovascular;  Laterality: N/A;   LEFT AND RIGHT HEART CATHETERIZATION WITH CORONARY/GRAFT ANGIOGRAM N/A 09/14/2014   Procedure: LEFT AND RIGHT HEART CATHETERIZATION WITH Isabel Caprice;  Surgeon: Kathleene Hazel, MD;  Location: [facility] CATH LAB;  Service: Cardiovascular;  Laterality: N/A;   REVERSE SHOULDER ARTHROPLASTY Left 03/06/2022   Procedure: LEFT SHOULDER REPLACEMENT APPLICATION OF WOUND VAC;  Surgeon: Cammy Copa, MD;  Location: [facility] OR;  Service: Orthopedics;  Laterality: Left;   TEE WITHOUT CARDIOVERSION N/A 03/14/2013   Procedure: TRANSESOPHAGEAL ECHOCARDIOGRAM (TEE);  Surgeon: Lewayne Bunting, MD;  Location:  [facility]OS[facility];  Service: Cardiovascular;  Laterality: N/A;   TEE WITHOUT CARDIOVERSION N/A 11/14/2014   Procedure: TRANSESOPHAGEAL ECHOCARDIOGRAM (TEE);  Surgeon: Lewayne Bunting, MD;  Location: [facility] ENDOSCOPY;  Service: Cardiovascular;  Laterality: N/A;   TONSILLECTOMY     maybe as a child- does not know   TOTAL KNEE ARTHROPLASTY  09/10/2012   Procedure: TOTAL KNEE ARTHROPLASTY;  Surgeon: [name] Hitch, MD;  Location: [facility];  Service: Orthopedics;  Laterality: Left;   TOTAL KNEE REVISION Right 07/15/2013   Procedure: REVISION ARTHROPLASTY RIGHT KNEE;  Surgeon: [name] Hitch, MD;  Location: [facility];  Service: Orthopedics;  Laterality: Right;   TRANSCATHETER AORTIC VALVE REPLACEMENT, TRANSFEMORAL N/A 12/12/2014   Procedure: TRANSCATHETER AORTIC VALVE REPLACEMENT, TRANSFEMORAL;  Surgeon: Kathleene Hazel,  MD;  Location: [facility] OR;  Service: Cardiovascular;  Laterality: N/A;   TRIGGER FINGER RELEASE  09/10/2012   Procedure: RELEASE TRIGGER FINGER/A-1 PULLEY;  Surgeon: [name] Hitch, MD;  Location: [facility];  Service: Orthopedics;  Laterality: Right;  Right Ring Finger   TUBAL LIGATION      Allergies  No Known Allergies  Home Medications    Prior to Admission medications   Medication Sig Start Date End Date Taking? Authorizing Provider  acetaminophen (TYLENOL) 500 MG tablet Take 1,000 mg by mouth every 6 (six) hours as needed for moderate pain.    [provider]  amLODipine (NORVASC) 5 MG tablet Take 1 tablet (5 mg total) by mouth daily. 03/23/23 03/22/24  Evlyn Kanner, MD  atorvastatin (LIPITOR) 80 MG tablet Take 1 tablet (80 mg total) by mouth daily. 03/23/23 03/22/24  Evlyn Kanner, MD  Blood Pressure Monitoring (BLOOD PRESSURE CUFF) MISC Please follow insert instructions. 08/19/23   Philomena Doheny, MD  busPIRone (BUSPAR) 15 MG tablet Take 1 tablet (15 mg total) by mouth 2 (two) times daily. 07/08/23   Shanna Cisco, NP  cholecalciferol (VITAMIN D3) 25 MCG (1000 UNIT) tablet Take 1,000 Units by mouth daily.    [provider]  clopidogrel (PLAVIX) 75 MG tablet TAKE 1 TABLET BY MOUTH IN THE MORNING 04/21/23   Evlyn Kanner, MD  Continuous Glucose Sensor (FREESTYLE LIBRE 3 SENSOR) MISC PLACE 1 SENSOR ON THE SKIN EVERY 14 DAYS TO CHECK GLUCOSE CONTINUOUSLY 06/30/23   Rana Snare, DO  Continuous Glucose Sensor (FREESTYLE LIBRE 3 SENSOR) MISC Place 1 sensor on the skin every 14 days. Use to check glucose continuously 08/18/23   Reymundo Poll, MD  DULoxetine (CYMBALTA) 20 MG capsule Take 1 capsule (20 mg total) by mouth daily. 07/08/23   Shanna Cisco, NP  Evolocumab (REPATHA SURECLICK) 140 MG/ML SOAJ Inject 140 mg into the skin every 14 (fourteen) days. 08/18/23   Reymundo Poll, MD  exemestane (AROMASIN) 25 MG tablet TAKE 1 TABLET BY MOUTH  ONCE DAILY AFTER BREAKFAST 12/15/22   Rachel Moulds, MD  ezetimibe (ZETIA) 10 MG tablet Take 1 tablet by mouth once daily Patient taking differently: Take 10 mg by mouth at bedtime. 04/21/23   Swinyer, Zachary George, NP  Fluticasone-Umeclidin-Vilant (TRELEGY ELLIPTA) 100-62.5-25 MCG/ACT AEPB INHALE 1 PUFF ONCE DAILY 08/05/23   Faith Rogue, DO  furosemide (LASIX) 40 MG tablet Take 1 tablet (40 mg total) by mouth as needed for fluid or edema. 08/19/22   Atway, Rayann N, DO  gabapentin (NEURONTIN) 300 MG capsule TAKE 2 CAPSULES BY MOUTH IN THE MORNING AND 3 AT BEDTIME Patient taking differently: Take 300-900 mg by mouth See admin instructions. TAKE 300 mg BY MOUTH IN  THE MORNING AND 900 mg AT BEDTIME 06/22/23   Steffanie Rainwater, MD  hydrocortisone cream 1 % Apply 1 Application topically daily as needed for itching.    [provider]  losartan-hydrochlorothiazide (HYZAAR) 100-25 MG tablet Take 1 tablet by mouth daily. Please repeat kidney labs to get additional refills 08/05/23   Faith Rogue, DO  metFORMIN (GLUCOPHAGE-XR) 500 MG 24 hr tablet Take 1 tablet (500 mg total) by mouth 2 (two) times daily. 08/18/23   Reymundo Poll, MD  metoprolol succinate (TOPROL-XL) 50 MG 24 hr tablet TAKE 1 TABLET BY MOUTH ONCE DAILY WITH MEALS OR  IMMEDIATELY  FOLLOWING  A  MEAL 12/15/22   Evlyn Kanner, MD  montelukast (SINGULAIR) 10 MG tablet TAKE 1 TABLET BY MOUTH AT BEDTIME 06/22/23   Steffanie Rainwater, MD  Semaglutide, 2 MG/DOSE, 8 MG/3ML SOPN Inject 2 mg into the skin once a week. 08/18/23   Reymundo Poll, MD  senna-docusate (SENNA-TIME S) 8.6-50 MG tablet Take 1 tablet by mouth at bedtime as needed for mild constipation. 07/08/22   Merrilyn Puma, MD  traZODone (DESYREL) 50 MG tablet Take 1-2 tablets (50-100 mg total) by mouth at bedtime. 07/08/23   Shanna Cisco, NP  VENTOLIN HFA 108 (90 Base) MCG/ACT inhaler Inhale 2 puffs into the lungs every 6 (six) hours as needed for wheezing or shortness  of breath. 08/05/23   Faith Rogue, DO  Fluticasone-Salmeterol (ADVAIR DISKUS) 100-50 MCG/DOSE AEPB Inhale 1 puff into the lungs daily. 02/20/20 05/09/20  Kirt Boys, MD    Physical Exam    Vital Signs:  [name] does not have vital signs available for review today.  Given telephonic nature of communication, physical exam is limited. AAOx3. NAD. Normal affect.  Speech and respirations are unlabored.  Accessory Clinical Findings    None  Assessment & Plan    1.  Preoperative Cardiovascular Risk Assessment:  According to the Revised Cardiac Risk Index (RCRI), her Perioperative Risk of Major Cardiac Event is (%): 0.9. Her Functional Capacity in METs is: 4.4 according to the [employer] Activity Status Index (DASI).Therefore, based on ACC/AHA guidelines, patient would be at acceptable risk for the planned procedure without further cardiovascular testing.  The patient was advised that if she develops new symptoms prior to surgery to contact our office to arrange for a follow-up visit, and she verbalized understanding.  Per office protocol, she may hold Plavix for 5 days prior to procedure. Please resume Plavix as soon as possible postprocedure, at the discretion of the surgeon.   A copy of this note will be routed to requesting surgeon.  Time:   Today, I have spent 5 minutes with the patient with telehealth technology discussing medical history, symptoms, and management plan.     Joylene Grapes, NP  09/11/2023, 10:35 AM

## 2023-09-13 ENCOUNTER — Other Ambulatory Visit: Payer: Self-pay | Admitting: Surgical

## 2023-09-13 LAB — URINE CULTURE
Culture: >=100000 — AB
Culture: 70000 — AB

## 2023-09-13 MED ORDER — NITROFURANTOIN MONOHYD MACRO 100 MG PO CAPS: 100.0000 mg | ORAL_CAPSULE | Freq: Two times a day (BID) | ORAL | 0 refills | Status: AC

## 2023-09-13 NOTE — Progress Notes (Signed)
Called patient.  Left voicemail describing culture results and sent in antibiotics for her to take up till day of surgery.

## 2023-09-14 ENCOUNTER — Inpatient Hospital Stay: Payer: Medicaid Other | Attending: Hematology and Oncology | Admitting: Hematology and Oncology

## 2023-09-14 ENCOUNTER — Inpatient Hospital Stay: Payer: Medicaid Other

## 2023-09-14 ENCOUNTER — Other Ambulatory Visit: Payer: Self-pay

## 2023-09-14 VITALS — BP 150/69 | HR 88 | Temp 98.4°F | Resp 19 | Ht 61.0 in | Wt 298.9 lb

## 2023-09-14 DIAGNOSIS — Z79811 Long term (current) use of aromatase inhibitors: Secondary | ICD-10-CM | POA: Diagnosis not present

## 2023-09-14 DIAGNOSIS — Z170 Estrogen receptor positive status [ER+]: Secondary | ICD-10-CM

## 2023-09-14 DIAGNOSIS — Z923 Personal history of irradiation: Secondary | ICD-10-CM | POA: Diagnosis not present

## 2023-09-14 DIAGNOSIS — C50411 Malignant neoplasm of upper-outer quadrant of right female breast: Secondary | ICD-10-CM | POA: Diagnosis not present

## 2023-09-14 DIAGNOSIS — Z87891 Personal history of nicotine dependence: Secondary | ICD-10-CM | POA: Insufficient documentation

## 2023-09-14 DIAGNOSIS — I152 Hypertension secondary to endocrine disorders: Secondary | ICD-10-CM

## 2023-09-14 LAB — CBC WITH DIFFERENTIAL (CANCER CENTER ONLY)
Abs Immature Granulocytes: 0.04 10*3/uL (ref 0.00–0.07)
Basophils Absolute: 0 10*3/uL (ref 0.0–0.1)
Basophils Relative: 1 %
Eosinophils Absolute: 0.2 10*3/uL (ref 0.0–0.5)
Eosinophils Relative: 4 %
HCT: 39.3 % (ref 36.0–46.0)
Hemoglobin: 13.1 g/dL (ref 12.0–15.0)
Immature Granulocytes: 1 %
Lymphocytes Relative: 24 %
Lymphs Abs: 1.3 10*3/uL (ref 0.7–4.0)
MCH: 28.6 pg (ref 26.0–34.0)
MCHC: 33.3 g/dL (ref 30.0–36.0)
MCV: 85.8 fL (ref 80.0–100.0)
Monocytes Absolute: 0.4 10*3/uL (ref 0.1–1.0)
Monocytes Relative: 7 %
Neutro Abs: 3.5 10*3/uL (ref 1.7–7.7)
Neutrophils Relative %: 63 %
Platelet Count: 187 10*3/uL (ref 150–400)
RBC: 4.58 MIL/uL (ref 3.87–5.11)
RDW: 13.1 % (ref 11.5–15.5)
WBC Count: 5.4 10*3/uL (ref 4.0–10.5)
nRBC: 0 % (ref 0.0–0.2)

## 2023-09-14 LAB — CMP (CANCER CENTER ONLY)
ALT: 11 U/L (ref 0–44)
AST: 13 U/L — ABNORMAL LOW (ref 15–41)
Albumin: 4.2 g/dL (ref 3.5–5.0)
Alkaline Phosphatase: 67 U/L (ref 38–126)
Anion gap: 9 (ref 5–15)
BUN: 11 mg/dL (ref 8–23)
CO2: 29 mmol/L (ref 22–32)
Calcium: 9.7 mg/dL (ref 8.9–10.3)
Chloride: 101 mmol/L (ref 98–111)
Creatinine: 0.83 mg/dL (ref 0.44–1.00)
GFR, Estimated: >60 mL/min (ref >60)
Glucose, Bld: 139 mg/dL — ABNORMAL HIGH (ref 70–99)
Potassium: 3.8 mmol/L (ref 3.5–5.1)
Sodium: 139 mmol/L (ref 135–145)
Total Bilirubin: 1.3 mg/dL — ABNORMAL HIGH (ref 0.3–1.2)
Total Protein: 7.2 g/dL (ref 6.5–8.1)

## 2023-09-14 NOTE — Progress Notes (Signed)
[facility]  Telephone:(336) [phone] Fax:(336) [phone]   ID: [name] DOB: May 24, 1961  MR#: [id number]  OZD#:[id number]  Patient Care Team: Colbert Coyer, Alyson Locket, MD as PCP - General Clif[place] James Nile Dear, MD as PCP - Cardiology (Cardiology) Magrinat, Valentino Hue, MD (Inactive) as Consulting Physician (Oncology) Dorothy Puffer, MD as Consulting Physician (Radiation Oncology) Kathleene Hazel, MD as Consulting Physician (Cardiology) Axel Filler, Larna Daughters, NP as Nurse Practitioner (Hematology and Oncology) Emelia Loron, MD as Consulting Physician (General Surgery) Evlyn Kanner, MD (Inactive) (Internal Medicine) Craft, Calvert Cantor, RN as Case Manager Gustavus Bryant, LCSW as [facility] Care Management (Licensed Clinical Social Worker) Alden Hipp, RPH-CPP (Pharmacist)  CHIEF COMPLAINT: Estrogen receptor positive breast cancer  CURRENT TREATMENT: Exemestane  INTERVAL HISTORY:  Discussed the use of AI scribe software for clinical note transcription with the patient, who gave verbal consent to proceed.  History of Present Illness   She is nearing the end of a five-year course of an unspecified medication for breast cancer, and is due for a mammogram. She has not had any recent hospitalizations or new specialist consultations, but she is scheduled for shoulder surgery due to cartilage loss.  The patient has been actively losing weight, with a total loss of about seventy pounds attributed to Ozempic, Trulicity, and dietary changes. She has not experienced any side effects from the Ozempic, and is considering increasing the dose if she reaches a weight loss plateau.  She has been experiencing frequent urinary tract infections, which she attributes to incontinence and the need to wear a pad. She has not had any recent falls or new bone pain. Her asthma symptoms are generally well-controlled, but she does experience some difficulty  breathing on humid days.   Rest of the pertinent 10 point ROS reviewed and neg.   COVID 19 VACCINATION STATUS: Pfizer x2, status post booster x1 as of August 2022   HISTORY OF CURRENT ILLNESS: On the original intake note:  [name] had routine screening mammography on 05/27/2018 showing a possible abnormality in the right breast. She underwent unilateral right  diagnostic mammography with tomography and right breast ultrasonography at [facility] on 06/02/2018 showing: breast density category B. An area of distortion is identified in the upper outer quadrant of the right breast mammographically. Ultrasonography of this area found an irregular hypoechoic mass at the 11 o'clock upper outer quadrant measuring 1.8 x 1.8 x 1.4 cm and located 6 cm from the nipple. The right axilla is negative sonographically for abnormal lymph nodes.   Accordingly on 06/04/2018 she proceeded to biopsy of the right breast area in question. 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 Ductal carcinoma in situ high grade. Prognostic indicators significant for: estrogen receptor, 100% positive and progesterone receptor, 100% positive, both with strong staining intensity. Proliferation marker Ki67 at 8%. HER2  not amplified with ratios HER2/CEP17 signals 1.32 and average HER2 copies per cell 1.85  The patient's subsequent history is as detailed below.   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; HAS LOWER BACK PAIN-UNABLE TO STAND MORE THAN 10 MIN; ARTHRITIS "ALL OVER"   Asthma    Blood transfusion    2013[facility] [place]   Breast  [facility]  Telephone:(336) [phone] [phone]) [phone]   ID: [name] DOB: May 24, 1961  MR#: [id number]  OZD#:[id number]  Patient Care Team: Colbert Coyer, Alyson Locket, MD as PCP - General Clifton James Nile Dear, MD as PCP - Cardiology (Cardiology) Magrinat, Valentino Hue, MD (Inactive) as Consulting Physician (Oncology) Dorothy Puffer, MD as Consulting Physician (Radiation Oncology) Kathleene Hazel, MD as Consulting Physician (Cardiology) Axel Filler, Larna Daughters, NP as Nurse Practitioner (Hematology and Oncology) Emelia Loron, MD as Consulting Physician (General Surgery) Evlyn Kanner, MD (Inactive) (Internal Medicine) Craft, Calvert Cantor, RN as Case Manager Gustavus Bryant, LCSW as [facility] Care Management (Licensed Clinical Social Worker) Alden Hipp, RPH-CPP (Pharmacist)  CHIEF COMPLAINT: Estrogen receptor positive breast cancer  CURRENT TREATMENT: Exemestane  INTERVAL HISTORY:  Discussed the use of AI scribe software for clinical note transcription with the patient, who gave verbal consent to proceed.  History of Present Illness   She is nearing the end of a five-year course of an unspecified medication for breast cancer, and is due for a mammogram. She has not had any recent hospitalizations or new specialist consultations, but she is scheduled for shoulder surgery due to cartilage loss.  The patient has been actively losing weight, with a total loss of about seventy pounds attributed to Ozempic, Trulicity, and dietary changes. She has not experienced any side effects from the Ozempic, and is considering increasing the dose if she reaches a weight loss plateau.  She has been experiencing frequent urinary tract infections, which she attributes to incontinence and the need to wear a pad. She has not had any recent falls or new bone pain. Her asthma symptoms are generally well-controlled, but she does experience some difficulty  breathing on humid days.   Rest of the pertinent 10 point ROS reviewed and neg.   COVID 19 VACCINATION STATUS: Pfizer x2, status post booster x1 as of August 2022   HISTORY OF CURRENT ILLNESS: On the original intake note:  [name] had routine screening mammography on 05/27/2018 showing a possible abnormality in the right breast. She underwent unilateral right  diagnostic mammography with tomography and right breast ultrasonography at [facility] on 06/02/2018 showing: breast density category B. An area of distortion is identified in the upper outer quadrant of the right breast mammographically. Ultrasonography of this area found an irregular hypoechoic mass at the 11 o'clock upper outer quadrant measuring 1.8 x 1.8 x 1.4 cm and located 6 cm from the nipple. The right axilla is negative sonographically for abnormal lymph nodes.   Accordingly on 06/04/2018 she proceeded to biopsy of the right breast area in question. 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 Ductal carcinoma in situ high grade. Prognostic indicators significant for: estrogen receptor, 100% positive and progesterone receptor, 100% positive, both with strong staining intensity. Proliferation marker Ki67 at 8%. HER2  not amplified with ratios HER2/CEP17 signals 1.32 and average HER2 copies per cell 1.85  The patient's subsequent history is as detailed below.   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; HAS LOWER BACK PAIN-UNABLE TO STAND MORE THAN 10 MIN; ARTHRITIS "ALL OVER"   Asthma    Blood transfusion    2013[facility] [place]   Breast  [facility]  Telephone:(336) [phone] [phone]) [phone]   ID: [name] DOB: May 24, 1961  MR#: [id number]  OZD#:[id number]  Patient Care Team: Colbert Coyer, Alyson Locket, MD as PCP - General Clifton James Nile Dear, MD as PCP - Cardiology (Cardiology) Magrinat, Valentino Hue, MD (Inactive) as Consulting Physician (Oncology) Dorothy Puffer, MD as Consulting Physician (Radiation Oncology) Kathleene Hazel, MD as Consulting Physician (Cardiology) Axel Filler, Larna Daughters, NP as Nurse Practitioner (Hematology and Oncology) Emelia Loron, MD as Consulting Physician (General Surgery) Evlyn Kanner, MD (Inactive) (Internal Medicine) Craft, Calvert Cantor, RN as Case Manager Gustavus Bryant, LCSW as [facility] Care Management (Licensed Clinical Social Worker) Alden Hipp, RPH-CPP (Pharmacist)  CHIEF COMPLAINT: Estrogen receptor positive breast cancer  CURRENT TREATMENT: Exemestane  INTERVAL HISTORY:  Discussed the use of AI scribe software for clinical note transcription with the patient, who gave verbal consent to proceed.  History of Present Illness   She is nearing the end of a five-year course of an unspecified medication for breast cancer, and is due for a mammogram. She has not had any recent hospitalizations or new specialist consultations, but she is scheduled for shoulder surgery due to cartilage loss.  The patient has been actively losing weight, with a total loss of about seventy pounds attributed to Ozempic, Trulicity, and dietary changes. She has not experienced any side effects from the Ozempic, and is considering increasing the dose if she reaches a weight loss plateau.  She has been experiencing frequent urinary tract infections, which she attributes to incontinence and the need to wear a pad. She has not had any recent falls or new bone pain. Her asthma symptoms are generally well-controlled, but she does experience some difficulty  breathing on humid days.   Rest of the pertinent 10 point ROS reviewed and neg.   COVID 19 VACCINATION STATUS: Pfizer x2, status post booster x1 as of August 2022   HISTORY OF CURRENT ILLNESS: On the original intake note:  [name] had routine screening mammography on 05/27/2018 showing a possible abnormality in the right breast. She underwent unilateral right  diagnostic mammography with tomography and right breast ultrasonography at [facility] on 06/02/2018 showing: breast density category B. An area of distortion is identified in the upper outer quadrant of the right breast mammographically. Ultrasonography of this area found an irregular hypoechoic mass at the 11 o'clock upper outer quadrant measuring 1.8 x 1.8 x 1.4 cm and located 6 cm from the nipple. The right axilla is negative sonographically for abnormal lymph nodes.   Accordingly on 06/04/2018 she proceeded to biopsy of the right breast area in question. 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 Ductal carcinoma in situ high grade. Prognostic indicators significant for: estrogen receptor, 100% positive and progesterone receptor, 100% positive, both with strong staining intensity. Proliferation marker Ki67 at 8%. HER2  not amplified with ratios HER2/CEP17 signals 1.32 and average HER2 copies per cell 1.85  The patient's subsequent history is as detailed below.   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  No results found for: "LABCA2"  No results found for: "XLK440"  No results found for: "CAN125"  No results found for: "CAN153"  No results found for: "CA2729"  No components found for: "HGQUANT"  No results found for: "CEA1", "CEA" / No results found for: "CEA1", "CEA"   No results found for: "AFPTUMOR"  No results found for: "CHROMOGRNA"  No  results found for: "HGBA", "HGBA2QUANT", "HGBFQUANT", "HGBSQUAN" (Hemoglobinopathy evaluation)   No results found for: "LDH"  Lab Results  Component Value Date   IRON 80 08/18/2023   TIBC 474 (H) 08/18/2023   IRONPCTSAT 17 08/18/2023   (Iron and TIBC)  Lab Results  Component Value Date   FERRITIN 35 08/18/2023    Urinalysis    Component Value Date/Time   COLORURINE YELLOW 09/11/2023 1538   APPEARANCEUR CLOUDY (A) 09/11/2023 1538   APPEARANCEUR Cloudy (A) 08/18/2023 1130   LABSPEC 1.025 09/11/2023 1538   PHURINE 6.0 09/11/2023 1538   GLUCOSEU NEGATIVE 09/11/2023 1538   HGBUR TRACE (A) 09/11/2023 1538   BILIRUBINUR NEGATIVE 09/11/2023 1538   BILIRUBINUR Negative 08/18/2023 1130   KETONESUR NEGATIVE 09/11/2023 1538   PROTEINUR NEGATIVE 09/11/2023 1538   UROBILINOGEN 1.0 02/25/2023 0000   UROBILINOGEN Normal 12/05/2022 0000   NITRITE NEGATIVE 09/11/2023 1538   LEUKOCYTESUR MODERATE (A) 09/11/2023 1538    STUDIES: DG Bone Density  Result Date: 09/03/2023 EXAM: DUAL X-RAY ABSORPTIOMETRY (DXA) FOR BONE MINERAL DENSITY IMPRESSION: Referring Physician:  Rachel Moulds Your patient completed a bone mineral density test using GE Lunar iDXA system (analysis version: 16). Technologist:     lmn PATIENT: Name: [name], [name] Patient ID: [id number] Birth Date: 12-15-1961 Height: 61.8 in. Sex: Female Measured: 09/03/2023 Weight: 297.6 lbs. Indications: Aromasin, Breast Cancer History, Buspirone, Caucasian, COPD, Cymbalta, Depression, Diabetic non insulin, Estrogen Deficient, Postmenopausal, Trazodone, Secondary Osteoporosis Fractures: NONE Treatments: Vitamin D (E933.5) ASSESSMENT: The BMD measured at Femur Neck Left is 0.867 g/cm2 with a T-score of -1.2. This patient is considered osteopenic/low bone mass according to World Health Organization [facility]) criteria. The quality of the exam is limited by patient body habitus. The lumbar spine was excluded due to degenerative changes and surgical  hardware. Site Region Measured Date Measured Age YA BMD Significant CHANGE T-score DualFemur Neck Left 09/03/2023 64.0 -1.2 0.867 g/cm2 DualFemur Total Mean 09/03/2023 64.0 -0.4 0.963 g/cm2 Left Forearm Radius 33% 09/03/2023 64.0 0.4 0.908 g/cm2 World Health Organization [facility]) criteria for post-menopausal, Caucasian Women: Normal       T-score at or above -1 SD Osteopenia   T-score between -1 and -2.5 SD Osteoporosis T-score at or below -2.5 SD RECOMMENDATION: 1. All patients should optimize calcium and vitamin D intake. 2. Consider FDA-approved medical therapies in postmenopausal women and men aged 72 years and older, based on the following: a. A hip or vertebral (clinical or morphometric) fracture. b. T-score = -2.5 at the femoral neck or spine after appropriate evaluation to exclude secondary causes. c. Low bone mass (T-score between -1.0 and -2.5 at the femoral neck or spine) and a 10-year probability of a hip fracture = 3% or a 10-year probability of a major osteoporosis-related fracture = 20%  Lab Results  Component Value Date   WBC 5.4 09/14/2023   NEUTROABS 3.5 09/14/2023   HGB 13.1 09/14/2023   HCT 39.3 09/14/2023   MCV 85.8 09/14/2023   PLT 187 09/14/2023   No results found for: "LABCA2"  No components found for: "ONGEXB284"  No results for input(s): "INR" in the last 168 hours.  No results found for: "LABCA2"  No results found for: "XLK440"  No results found for: "CAN125"  No results found for: "CAN153"  No results found for: "CA2729"  No components found for: "HGQUANT"  No results found for: "CEA1", "CEA" / No results found for: "CEA1", "CEA"   No results found for: "AFPTUMOR"  No results found for: "CHROMOGRNA"  No  results found for: "HGBA", "HGBA2QUANT", "HGBFQUANT", "HGBSQUAN" (Hemoglobinopathy evaluation)   No results found for: "LDH"  Lab Results  Component Value Date   IRON 80 08/18/2023   TIBC 474 (H) 08/18/2023   IRONPCTSAT 17 08/18/2023   (Iron and TIBC)  Lab Results  Component Value Date   FERRITIN 35 08/18/2023    Urinalysis    Component Value Date/Time   COLORURINE YELLOW 09/11/2023 1538   APPEARANCEUR CLOUDY (A) 09/11/2023 1538   APPEARANCEUR Cloudy (A) 08/18/2023 1130   LABSPEC 1.025 09/11/2023 1538   PHURINE 6.0 09/11/2023 1538   GLUCOSEU NEGATIVE 09/11/2023 1538   HGBUR TRACE (A) 09/11/2023 1538   BILIRUBINUR NEGATIVE 09/11/2023 1538   BILIRUBINUR Negative 08/18/2023 1130   KETONESUR NEGATIVE 09/11/2023 1538   PROTEINUR NEGATIVE 09/11/2023 1538   UROBILINOGEN 1.0 02/25/2023 0000   UROBILINOGEN Normal 12/05/2022 0000   NITRITE NEGATIVE 09/11/2023 1538   LEUKOCYTESUR MODERATE (A) 09/11/2023 1538    STUDIES: DG Bone Density  Result Date: 09/03/2023 EXAM: DUAL X-RAY ABSORPTIOMETRY (DXA) FOR BONE MINERAL DENSITY IMPRESSION: Referring Physician:  Rachel Moulds Your patient completed a bone mineral density test using GE Lunar iDXA system (analysis version: 16). Technologist:     lmn PATIENT: Name: [name], [name] Patient ID: [id number] Birth Date: 12-15-1961 Height: 61.8 in. Sex: Female Measured: 09/03/2023 Weight: 297.6 lbs. Indications: Aromasin, Breast Cancer History, Buspirone, Caucasian, COPD, Cymbalta, Depression, Diabetic non insulin, Estrogen Deficient, Postmenopausal, Trazodone, Secondary Osteoporosis Fractures: NONE Treatments: Vitamin D (E933.5) ASSESSMENT: The BMD measured at Femur Neck Left is 0.867 g/cm2 with a T-score of -1.2. This patient is considered osteopenic/low bone mass according to World Health Organization [facility]) criteria. The quality of the exam is limited by patient body habitus. The lumbar spine was excluded due to degenerative changes and surgical  hardware. Site Region Measured Date Measured Age YA BMD Significant CHANGE T-score DualFemur Neck Left 09/03/2023 64.0 -1.2 0.867 g/cm2 DualFemur Total Mean 09/03/2023 64.0 -0.4 0.963 g/cm2 Left Forearm Radius 33% 09/03/2023 64.0 0.4 0.908 g/cm2 World Health Organization [facility]) criteria for post-menopausal, Caucasian Women: Normal       T-score at or above -1 SD Osteopenia   T-score between -1 and -2.5 SD Osteoporosis T-score at or below -2.5 SD RECOMMENDATION: 1. All patients should optimize calcium and vitamin D intake. 2. Consider FDA-approved medical therapies in postmenopausal women and men aged 72 years and older, based on the following: a. A hip or vertebral (clinical or morphometric) fracture. b. T-score = -2.5 at the femoral neck or spine after appropriate evaluation to exclude secondary causes. c. Low bone mass (T-score between -1.0 and -2.5 at the femoral neck or spine) and a 10-year probability of a hip fracture = 3% or a 10-year probability of a major osteoporosis-related fracture = 20%  [facility]  Telephone:(336) [phone] Fax:(336) [phone]   ID: [name] DOB: May 24, 1961  MR#: [id number]  OZD#:[id number]  Patient Care Team: Colbert Coyer, Alyson Locket, MD as PCP - General Cl[place] James Nile Dear, MD as PCP - Cardiology (Cardiology) Magrinat, Valentino Hue, MD (Inactive) as Consulting Physician (Oncology) Dorothy Puffer, MD as Consulting Physician (Radiation Oncology) Kathleene Hazel, MD as Consulting Physician (Cardiology) Axel Filler, Larna Daughters, NP as Nurse Practitioner (Hematology and Oncology) Emelia Loron, MD as Consulting Physician (General Surgery) Evlyn Kanner, MD (Inactive) (Internal Medicine) Craft, Calvert Cantor, RN as Case Manager Gustavus Bryant, LCSW as [facility] Care Management (Licensed Clinical Social Worker) Alden Hipp, RPH-CPP (Pharmacist)  CHIEF COMPLAINT: Estrogen receptor positive breast cancer  CURRENT TREATMENT: Exemestane  INTERVAL HISTORY:  Discussed the use of AI scribe software for clinical note transcription with the patient, who gave verbal consent to proceed.  History of Present Illness   She is nearing the end of a five-year course of an unspecified medication for breast cancer, and is due for a mammogram. She has not had any recent hospitalizations or new specialist consultations, but she is scheduled for shoulder surgery due to cartilage loss.  The patient has been actively losing weight, with a total loss of about seventy pounds attributed to Ozempic, Trulicity, and dietary changes. She has not experienced any side effects from the Ozempic, and is considering increasing the dose if she reaches a weight loss plateau.  She has been experiencing frequent urinary tract infections, which she attributes to incontinence and the need to wear a pad. She has not had any recent falls or new bone pain. Her asthma symptoms are generally well-controlled, but she does experience some difficulty  breathing on humid days.   Rest of the pertinent 10 point ROS reviewed and neg.   COVID 19 VACCINATION STATUS: Pfizer x2, status post booster x1 as of August 2022   HISTORY OF CURRENT ILLNESS: On the original intake note:  [name] had routine screening mammography on 05/27/2018 showing a possible abnormality in the right breast. She underwent unilateral right  diagnostic mammography with tomography and right breast ultrasonography at [facility] on 06/02/2018 showing: breast density category B. An area of distortion is identified in the upper outer quadrant of the right breast mammographically. Ultrasonography of this area found an irregular hypoechoic mass at the 11 o'clock upper outer quadrant measuring 1.8 x 1.8 x 1.4 cm and located 6 cm from the nipple. The right axilla is negative sonographically for abnormal lymph nodes.   Accordingly on 06/04/2018 she proceeded to biopsy of the right breast area in question. 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 Ductal carcinoma in situ high grade. Prognostic indicators significant for: estrogen receptor, 100% positive and progesterone receptor, 100% positive, both with strong staining intensity. Proliferation marker Ki67 at 8%. HER2  not amplified with ratios HER2/CEP17 signals 1.32 and average HER2 copies per cell 1.85  The patient's subsequent history is as detailed below.   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; HAS LOWER BACK PAIN-UNABLE TO STAND MORE THAN 10 MIN; ARTHRITIS "ALL OVER"   Asthma    Blood transfusion    2013[facility] [place]   Breast  [facility]  Telephone:(336) [phone] Fax:(336) [phone]   ID: [name] DOB: May 24, 1961  MR#: [id number]  OZD#:[id number]  Patient Care Team: Colbert Coyer, Alyson Locket, MD as PCP - General Clifton James Nile Dear, MD as PCP - Cardiology (Cardiology) Magrinat, Valentino Hue, MD (Inactive) as Consulting Physician (Oncology) Dorothy Puffer, MD as Consulting Physician (Radiation Oncology) Kathleene Hazel, MD as Consulting Physician (Cardiology) Axel Filler, Larna Daughters, NP as Nurse Practitioner (Hematology and Oncology) Emelia Loron, MD as Consulting Physician (General Surgery) Evlyn Kanner, MD (Inactive) (Internal Medicine) Craft, Calvert Cantor, RN as Case Manager Gustavus Bryant, LCSW as [facility] Care Management (Licensed Clinical Social Worker) Alden Hipp, RPH-CPP (Pharmacist)  CHIEF COMPLAINT: Estrogen receptor positive breast cancer  CURRENT TREATMENT: Exemestane  INTERVAL HISTORY:  Discussed the use of AI scribe software for clinical note transcription with the patient, who gave verbal consent to proceed.  History of Present Illness   She is nearing the end of a five-year course of an unspecified medication for breast cancer, and is due for a mammogram. She has not had any recent hospitalizations or new specialist consultations, but she is scheduled for shoulder surgery due to cartilage loss.  The patient has been actively losing weight, with a total loss of about seventy pounds attributed to Ozempic, Trulicity, and dietary changes. She has not experienced any side effects from the Ozempic, and is considering increasing the dose if she reaches a weight loss plateau.  She has been experiencing frequent urinary tract infections, which she attributes to incontinence and the need to wear a pad. She has not had any recent falls or new bone pain. Her asthma symptoms are generally well-controlled, but she does experience some difficulty  breathing on humid days.   Rest of the pertinent 10 point ROS reviewed and neg.   COVID 19 VACCINATION STATUS: Pfizer x2, status post booster x1 as of August 2022   HISTORY OF CURRENT ILLNESS: On the original intake note:  [name] had routine screening mammography on 05/27/2018 showing a possible abnormality in the right breast. She underwent unilateral right  diagnostic mammography with tomography and right breast ultrasonography at [facility] on 06/02/2018 showing: breast density category B. An area of distortion is identified in the upper outer quadrant of the right breast mammographically. Ultrasonography of this area found an irregular hypoechoic mass at the 11 o'clock upper outer quadrant measuring 1.8 x 1.8 x 1.4 cm and located 6 cm from the nipple. The right axilla is negative sonographically for abnormal lymph nodes.   Accordingly on 06/04/2018 she proceeded to biopsy of the right breast area in question. 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 Ductal carcinoma in situ high grade. Prognostic indicators significant for: estrogen receptor, 100% positive and progesterone receptor, 100% positive, both with strong staining intensity. Proliferation marker Ki67 at 8%. HER2  not amplified with ratios HER2/CEP17 signals 1.32 and average HER2 copies per cell 1.85  The patient's subsequent history is as detailed below.   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  No results found for: "LABCA2"  No results found for: "XLK440"  No results found for: "CAN125"  No results found for: "CAN153"  No results found for: "CA2729"  No components found for: "HGQUANT"  No results found for: "CEA1", "CEA" / No results found for: "CEA1", "CEA"   No results found for: "AFPTUMOR"  No results found for: "CHROMOGRNA"  No  results found for: "HGBA", "HGBA2QUANT", "HGBFQUANT", "HGBSQUAN" (Hemoglobinopathy evaluation)   No results found for: "LDH"  Lab Results  Component Value Date   IRON 80 08/18/2023   TIBC 474 (H) 08/18/2023   IRONPCTSAT 17 08/18/2023   (Iron and TIBC)  Lab Results  Component Value Date   FERRITIN 35 08/18/2023    Urinalysis    Component Value Date/Time   COLORURINE YELLOW 09/11/2023 1538   APPEARANCEUR CLOUDY (A) 09/11/2023 1538   APPEARANCEUR Cloudy (A) 08/18/2023 1130   LABSPEC 1.025 09/11/2023 1538   PHURINE 6.0 09/11/2023 1538   GLUCOSEU NEGATIVE 09/11/2023 1538   HGBUR TRACE (A) 09/11/2023 1538   BILIRUBINUR NEGATIVE 09/11/2023 1538   BILIRUBINUR Negative 08/18/2023 1130   KETONESUR NEGATIVE 09/11/2023 1538   PROTEINUR NEGATIVE 09/11/2023 1538   UROBILINOGEN 1.0 02/25/2023 0000   UROBILINOGEN Normal 12/05/2022 0000   NITRITE NEGATIVE 09/11/2023 1538   LEUKOCYTESUR MODERATE (A) 09/11/2023 1538    STUDIES: DG Bone Density  Result Date: 09/03/2023 EXAM: DUAL X-RAY ABSORPTIOMETRY (DXA) FOR BONE MINERAL DENSITY IMPRESSION: Referring Physician:  Rachel Moulds Your patient completed a bone mineral density test using GE Lunar iDXA system (analysis version: 16). Technologist:     lmn PATIENT: Name: [name], [name] Patient ID: [id number] Birth Date: 12-15-1961 Height: 61.8 in. Sex: Female Measured: 09/03/2023 Weight: 297.6 lbs. Indications: Aromasin, Breast Cancer History, Buspirone, Caucasian, COPD, Cymbalta, Depression, Diabetic non insulin, Estrogen Deficient, Postmenopausal, Trazodone, Secondary Osteoporosis Fractures: NONE Treatments: Vitamin D (E933.5) ASSESSMENT: The BMD measured at Femur Neck Left is 0.867 g/cm2 with a T-score of -1.2. This patient is considered osteopenic/low bone mass according to World Health Organization [facility]) criteria. The quality of the exam is limited by patient body habitus. The lumbar spine was excluded due to degenerative changes and surgical  hardware. Site Region Measured Date Measured Age YA BMD Significant CHANGE T-score DualFemur Neck Left 09/03/2023 64.0 -1.2 0.867 g/cm2 DualFemur Total Mean 09/03/2023 64.0 -0.4 0.963 g/cm2 Left Forearm Radius 33% 09/03/2023 64.0 0.4 0.908 g/cm2 World Health Organization [facility]) criteria for post-menopausal, Caucasian Women: Normal       T-score at or above -1 SD Osteopenia   T-score between -1 and -2.5 SD Osteoporosis T-score at or below -2.5 SD RECOMMENDATION: 1. All patients should optimize calcium and vitamin D intake. 2. Consider FDA-approved medical therapies in postmenopausal women and men aged 72 years and older, based on the following: a. A hip or vertebral (clinical or morphometric) fracture. b. T-score = -2.5 at the femoral neck or spine after appropriate evaluation to exclude secondary causes. c. Low bone mass (T-score between -1.0 and -2.5 at the femoral neck or spine) and a 10-year probability of a hip fracture = 3% or a 10-year probability of a major osteoporosis-related fracture = 20%

## 2023-09-15 ENCOUNTER — Observation Stay (HOSPITAL_COMMUNITY)
Admission: RE | Admit: 2023-09-15 | Discharge: 2023-09-16 | Disposition: A | Discharge status: Home / Self Care | Payer: Medicaid Other | Attending: Orthopedic Surgery | Admitting: Orthopedic Surgery

## 2023-09-15 ENCOUNTER — Observation Stay (HOSPITAL_COMMUNITY): Payer: Medicaid Other

## 2023-09-15 ENCOUNTER — Other Ambulatory Visit: Payer: Self-pay

## 2023-09-15 ENCOUNTER — Encounter (HOSPITAL_COMMUNITY): Payer: Self-pay | Admitting: Orthopedic Surgery

## 2023-09-15 ENCOUNTER — Ambulatory Visit (HOSPITAL_BASED_OUTPATIENT_CLINIC_OR_DEPARTMENT_OTHER): Payer: Medicaid Other | Admitting: Anesthesiology

## 2023-09-15 ENCOUNTER — Ambulatory Visit (HOSPITAL_COMMUNITY): Payer: Medicaid Other | Admitting: Vascular Surgery

## 2023-09-15 ENCOUNTER — Encounter (HOSPITAL_COMMUNITY)
Admission: RE | Disposition: A | Discharge status: Home / Self Care | Payer: Self-pay | Source: Home / Self Care | Attending: Orthopedic Surgery

## 2023-09-15 DIAGNOSIS — J45909 Unspecified asthma, uncomplicated: Secondary | ICD-10-CM | POA: Diagnosis not present

## 2023-09-15 DIAGNOSIS — G8918 Other acute postprocedural pain: Secondary | ICD-10-CM | POA: Diagnosis not present

## 2023-09-15 DIAGNOSIS — M19011 Primary osteoarthritis, right shoulder: Secondary | ICD-10-CM

## 2023-09-15 DIAGNOSIS — I509 Heart failure, unspecified: Secondary | ICD-10-CM | POA: Diagnosis not present

## 2023-09-15 DIAGNOSIS — Z7984 Long term (current) use of oral hypoglycemic drugs: Secondary | ICD-10-CM | POA: Insufficient documentation

## 2023-09-15 DIAGNOSIS — Z01818 Encounter for other preprocedural examination: Principal | ICD-10-CM

## 2023-09-15 DIAGNOSIS — M19111 Post-traumatic osteoarthritis, right shoulder: Secondary | ICD-10-CM | POA: Diagnosis not present

## 2023-09-15 DIAGNOSIS — Z96611 Presence of right artificial shoulder joint: Secondary | ICD-10-CM

## 2023-09-15 DIAGNOSIS — Z87891 Personal history of nicotine dependence: Secondary | ICD-10-CM | POA: Insufficient documentation

## 2023-09-15 DIAGNOSIS — M7521 Bicipital tendinitis, right shoulder: Secondary | ICD-10-CM

## 2023-09-15 DIAGNOSIS — M19019 Primary osteoarthritis, unspecified shoulder: Secondary | ICD-10-CM | POA: Diagnosis present

## 2023-09-15 DIAGNOSIS — I2510 Atherosclerotic heart disease of native coronary artery without angina pectoris: Secondary | ICD-10-CM | POA: Insufficient documentation

## 2023-09-15 DIAGNOSIS — Z7902 Long term (current) use of antithrombotics/antiplatelets: Secondary | ICD-10-CM | POA: Diagnosis not present

## 2023-09-15 DIAGNOSIS — Z955 Presence of coronary angioplasty implant and graft: Secondary | ICD-10-CM | POA: Insufficient documentation

## 2023-09-15 DIAGNOSIS — E119 Type 2 diabetes mellitus without complications: Secondary | ICD-10-CM | POA: Diagnosis not present

## 2023-09-15 DIAGNOSIS — I5032 Chronic diastolic (congestive) heart failure: Secondary | ICD-10-CM | POA: Insufficient documentation

## 2023-09-15 DIAGNOSIS — Z86711 Personal history of pulmonary embolism: Secondary | ICD-10-CM | POA: Diagnosis not present

## 2023-09-15 DIAGNOSIS — I110 Hypertensive heart disease with heart failure: Secondary | ICD-10-CM | POA: Insufficient documentation

## 2023-09-15 DIAGNOSIS — J449 Chronic obstructive pulmonary disease, unspecified: Secondary | ICD-10-CM | POA: Diagnosis not present

## 2023-09-15 DIAGNOSIS — Z79899 Other long term (current) drug therapy: Secondary | ICD-10-CM | POA: Insufficient documentation

## 2023-09-15 DIAGNOSIS — Z96653 Presence of artificial knee joint, bilateral: Secondary | ICD-10-CM | POA: Insufficient documentation

## 2023-09-15 HISTORY — PX: BICEPT TENODESIS: SHX5116

## 2023-09-15 HISTORY — PX: REVERSE SHOULDER ARTHROPLASTY: SHX5054

## 2023-09-15 LAB — GLUCOSE, CAPILLARY
Glucose-Capillary: 115 mg/dL — ABNORMAL HIGH (ref 70–99)
Glucose-Capillary: 176 mg/dL — ABNORMAL HIGH (ref 70–99)
Glucose-Capillary: 179 mg/dL — ABNORMAL HIGH (ref 70–99)
Glucose-Capillary: 187 mg/dL — ABNORMAL HIGH (ref 70–99)

## 2023-09-15 SURGERY — ARTHROPLASTY, SHOULDER, TOTAL, REVERSE
Anesthesia: General | Site: Shoulder | Laterality: Right

## 2023-09-15 MED ORDER — CLOPIDOGREL BISULFATE 75 MG PO TABS
75.0000 mg | ORAL_TABLET | Freq: Every morning | ORAL | Status: DC
  Filled 2023-09-15: qty 1

## 2023-09-15 MED ORDER — ROCURONIUM BROMIDE 10 MG/ML (PF) SYRINGE
PREFILLED_SYRINGE | INTRAVENOUS | Status: AC
  Filled 2023-09-15: qty 10

## 2023-09-15 MED ORDER — IRRISEPT - 450ML BOTTLE WITH 0.05% CHG IN STERILE WATER, USP 99.95% OPTIME
TOPICAL | Status: DC | PRN
  Administered 2023-09-15: 450 mL

## 2023-09-15 MED ORDER — METHOCARBAMOL 1000 MG/10ML IJ SOLN: 500.0000 mg | Freq: Four times a day (QID) | INTRAVENOUS | Status: DC | PRN

## 2023-09-15 MED ORDER — METHOCARBAMOL 500 MG PO TABS
ORAL_TABLET | ORAL | Status: AC
  Filled 2023-09-15: qty 1

## 2023-09-15 MED ORDER — ACETAMINOPHEN 500 MG PO TABS
1000.0000 mg | ORAL_TABLET | Freq: Four times a day (QID) | ORAL | Status: DC
  Administered 2023-09-15 – 2023-09-16 (×3): 1000 mg via ORAL
  Filled 2023-09-15 (×3): qty 2

## 2023-09-15 MED ORDER — DEXAMETHASONE SODIUM PHOSPHATE 10 MG/ML IJ SOLN
INTRAMUSCULAR | Status: DC | PRN
  Administered 2023-09-15: 5 mg via INTRAVENOUS

## 2023-09-15 MED ORDER — CEFAZOLIN SODIUM-DEXTROSE 2-4 GM/100ML-% IV SOLN
2.0000 g | Freq: Three times a day (TID) | INTRAVENOUS | Status: AC
  Administered 2023-09-15 – 2023-09-16 (×3): 2 g via INTRAVENOUS
  Filled 2023-09-15 (×3): qty 100

## 2023-09-15 MED ORDER — INSULIN ASPART 100 UNIT/ML IJ SOLN: 0.0000 [IU] | Freq: Every day | INTRAMUSCULAR | Status: DC

## 2023-09-15 MED ORDER — DULOXETINE HCL 20 MG PO CPEP
20.0000 mg | ORAL_CAPSULE | Freq: Every day | ORAL | Status: DC
  Filled 2023-09-15: qty 1

## 2023-09-15 MED ORDER — FENTANYL CITRATE (PF) 250 MCG/5ML IJ SOLN
INTRAMUSCULAR | Status: DC | PRN
  Administered 2023-09-15: 50 ug via INTRAVENOUS

## 2023-09-15 MED ORDER — TRANEXAMIC ACID-NACL 1000-0.7 MG/100ML-% IV SOLN
1000.0000 mg | INTRAVENOUS | Status: AC
  Administered 2023-09-15: 1000 mg via INTRAVENOUS

## 2023-09-15 MED ORDER — GABAPENTIN 300 MG PO CAPS: 300.0000 mg | ORAL_CAPSULE | ORAL | Status: DC

## 2023-09-15 MED ORDER — ORAL CARE MOUTH RINSE: 15.0000 mL | Freq: Once | OROMUCOSAL | Status: AC

## 2023-09-15 MED ORDER — BUPIVACAINE LIPOSOME 1.3 % IJ SUSP
INTRAMUSCULAR | Status: DC | PRN
  Administered 2023-09-15: 10 mL

## 2023-09-15 MED ORDER — ACETAMINOPHEN 10 MG/ML IV SOLN
1000.0000 mg | Freq: Once | INTRAVENOUS | Status: AC
  Administered 2023-09-15: 1000 mg via INTRAVENOUS
  Filled 2023-09-15: qty 100

## 2023-09-15 MED ORDER — CEFAZOLIN IN SODIUM CHLORIDE 3-0.9 GM/100ML-% IV SOLN
3.0000 g | INTRAVENOUS | Status: AC
  Administered 2023-09-15: 3 g via INTRAVENOUS

## 2023-09-15 MED ORDER — PROPOFOL 10 MG/ML IV BOLUS
INTRAVENOUS | Status: AC
  Filled 2023-09-15: qty 20

## 2023-09-15 MED ORDER — EZETIMIBE 10 MG PO TABS
10.0000 mg | ORAL_TABLET | Freq: Every day | ORAL | Status: DC
  Administered 2023-09-15: 10 mg via ORAL
  Filled 2023-09-15: qty 1

## 2023-09-15 MED ORDER — ALBUMIN HUMAN 5 % IV SOLN
INTRAVENOUS | Status: DC | PRN
Start: 2023-09-15 — End: 2023-09-15

## 2023-09-15 MED ORDER — FENTANYL CITRATE (PF) 250 MCG/5ML IJ SOLN
INTRAMUSCULAR | Status: AC
  Filled 2023-09-15: qty 5

## 2023-09-15 MED ORDER — PHENOL 1.4 % MT LIQD: 1.0000 | OROMUCOSAL | Status: DC | PRN

## 2023-09-15 MED ORDER — INSULIN ASPART 100 UNIT/ML IJ SOLN
0.0000 [IU] | Freq: Three times a day (TID) | INTRAMUSCULAR | Status: DC
  Administered 2023-09-15: 4 [IU] via SUBCUTANEOUS

## 2023-09-15 MED ORDER — CHLORHEXIDINE GLUCONATE 0.12 % MT SOLN
15.0000 mL | Freq: Once | OROMUCOSAL | Status: AC
  Administered 2023-09-15: 15 mL via OROMUCOSAL

## 2023-09-15 MED ORDER — POVIDONE-IODINE 10 % EX SWAB
2.0000 | Freq: Once | CUTANEOUS | Status: AC
  Administered 2023-09-15: 2 via TOPICAL

## 2023-09-15 MED ORDER — METOPROLOL SUCCINATE ER 50 MG PO TB24: 50.0000 mg | ORAL_TABLET | Freq: Every day | ORAL | Status: DC

## 2023-09-15 MED ORDER — LOSARTAN POTASSIUM-HCTZ 100-25 MG PO TABS
1.0000 | ORAL_TABLET | Freq: Every day | ORAL | 2 refills | Status: DC
Start: 2023-09-15 — End: 2023-12-14

## 2023-09-15 MED ORDER — GABAPENTIN 300 MG PO CAPS
300.0000 mg | ORAL_CAPSULE | Freq: Every day | ORAL | Status: DC
  Administered 2023-09-15: 300 mg via ORAL
  Filled 2023-09-15: qty 1

## 2023-09-15 MED ORDER — MENTHOL 3 MG MT LOZG: 1.0000 | LOZENGE | OROMUCOSAL | Status: DC | PRN

## 2023-09-15 MED ORDER — SUCCINYLCHOLINE CHLORIDE 200 MG/10ML IV SOSY
PREFILLED_SYRINGE | INTRAVENOUS | Status: AC
  Filled 2023-09-15: qty 10

## 2023-09-15 MED ORDER — ONDANSETRON HCL 4 MG/2ML IJ SOLN
INTRAMUSCULAR | Status: DC | PRN
  Administered 2023-09-15: 4 mg via INTRAVENOUS

## 2023-09-15 MED ORDER — ACETAMINOPHEN 500 MG PO TABS
ORAL_TABLET | ORAL | Status: AC
  Administered 2023-09-15: 1000 mg via ORAL
  Filled 2023-09-15: qty 2

## 2023-09-15 MED ORDER — ASPIRIN 81 MG PO TBEC
81.0000 mg | DELAYED_RELEASE_TABLET | Freq: Every day | ORAL | Status: DC
  Administered 2023-09-15: 81 mg via ORAL
  Filled 2023-09-15: qty 1

## 2023-09-15 MED ORDER — HYDROMORPHONE HCL 1 MG/ML IJ SOLN: 0.5000 mg | INTRAMUSCULAR | Status: DC | PRN

## 2023-09-15 MED ORDER — METFORMIN HCL ER 500 MG PO TB24
500.0000 mg | ORAL_TABLET | Freq: Two times a day (BID) | ORAL | Status: DC
  Administered 2023-09-15 – 2023-09-16 (×2): 500 mg via ORAL
  Filled 2023-09-15 (×2): qty 1

## 2023-09-15 MED ORDER — TRANEXAMIC ACID-NACL 1000-0.7 MG/100ML-% IV SOLN
INTRAVENOUS | Status: AC
  Filled 2023-09-15: qty 100

## 2023-09-15 MED ORDER — ALBUTEROL SULFATE (2.5 MG/3ML) 0.083% IN NEBU: 3.0000 mL | INHALATION_SOLUTION | Freq: Four times a day (QID) | RESPIRATORY_TRACT | Status: DC | PRN

## 2023-09-15 MED ORDER — MIDAZOLAM HCL 2 MG/2ML IJ SOLN
INTRAMUSCULAR | Status: AC
  Filled 2023-09-15: qty 2

## 2023-09-15 MED ORDER — SUCCINYLCHOLINE CHLORIDE 200 MG/10ML IV SOSY
PREFILLED_SYRINGE | INTRAVENOUS | Status: DC | PRN
  Administered 2023-09-15: 140 mg via INTRAVENOUS

## 2023-09-15 MED ORDER — FUROSEMIDE 40 MG PO TABS: 40.0000 mg | ORAL_TABLET | ORAL | Status: DC | PRN

## 2023-09-15 MED ORDER — EPHEDRINE SULFATE-NACL 50-0.9 MG/10ML-% IV SOSY
PREFILLED_SYRINGE | INTRAVENOUS | Status: DC | PRN
Start: 2023-09-15 — End: 2023-09-15
  Administered 2023-09-15 (×4): 5 mg via INTRAVENOUS

## 2023-09-15 MED ORDER — ACETAMINOPHEN 325 MG PO TABS: 325.0000 mg | ORAL_TABLET | Freq: Four times a day (QID) | ORAL | Status: DC | PRN

## 2023-09-15 MED ORDER — ROCURONIUM BROMIDE 10 MG/ML (PF) SYRINGE
PREFILLED_SYRINGE | INTRAVENOUS | Status: DC | PRN
  Administered 2023-09-15: 50 mg via INTRAVENOUS

## 2023-09-15 MED ORDER — METOCLOPRAMIDE HCL 5 MG PO TABS: 5.0000 mg | ORAL_TABLET | Freq: Three times a day (TID) | ORAL | Status: DC | PRN

## 2023-09-15 MED ORDER — AMLODIPINE BESYLATE 5 MG PO TABS: 5.0000 mg | ORAL_TABLET | Freq: Every day | ORAL | Status: DC

## 2023-09-15 MED ORDER — ONDANSETRON HCL 4 MG/2ML IJ SOLN: 4.0000 mg | Freq: Four times a day (QID) | INTRAMUSCULAR | Status: DC | PRN

## 2023-09-15 MED ORDER — BUSPIRONE HCL 15 MG PO TABS
15.0000 mg | ORAL_TABLET | Freq: Two times a day (BID) | ORAL | Status: DC
  Administered 2023-09-15: 15 mg via ORAL
  Filled 2023-09-15: qty 1

## 2023-09-15 MED ORDER — CEFAZOLIN IN SODIUM CHLORIDE 3-0.9 GM/100ML-% IV SOLN
INTRAVENOUS | Status: AC
  Filled 2023-09-15: qty 100

## 2023-09-15 MED ORDER — LOSARTAN POTASSIUM 50 MG PO TABS: 100.0000 mg | ORAL_TABLET | Freq: Every day | ORAL | Status: DC

## 2023-09-15 MED ORDER — SODIUM CHLORIDE 0.9 % IV SOLN: INTRAVENOUS | Status: AC

## 2023-09-15 MED ORDER — PHENYLEPHRINE HCL-NACL 20-0.9 MG/250ML-% IV SOLN
INTRAVENOUS | Status: DC | PRN
Start: 2023-09-15 — End: 2023-09-15
  Administered 2023-09-15: 25 ug/min via INTRAVENOUS

## 2023-09-15 MED ORDER — MIDAZOLAM HCL 2 MG/2ML IJ SOLN
INTRAMUSCULAR | Status: DC | PRN
  Administered 2023-09-15: 1 mg via INTRAVENOUS

## 2023-09-15 MED ORDER — VANCOMYCIN HCL 1000 MG IV SOLR
INTRAVENOUS | Status: DC | PRN
  Administered 2023-09-15: 1000 mg via TOPICAL

## 2023-09-15 MED ORDER — SENNOSIDES-DOCUSATE SODIUM 8.6-50 MG PO TABS: 1.0000 | ORAL_TABLET | Freq: Every evening | ORAL | Status: DC | PRN

## 2023-09-15 MED ORDER — EXEMESTANE 25 MG PO TABS
25.0000 mg | ORAL_TABLET | Freq: Every day | ORAL | Status: DC
  Filled 2023-09-15: qty 1

## 2023-09-15 MED ORDER — HYDROMORPHONE HCL 1 MG/ML IJ SOLN
0.2500 mg | INTRAMUSCULAR | Status: DC | PRN
  Administered 2023-09-15: 0.5 mg via INTRAVENOUS

## 2023-09-15 MED ORDER — ONDANSETRON HCL 4 MG PO TABS: 4.0000 mg | ORAL_TABLET | Freq: Four times a day (QID) | ORAL | Status: DC | PRN

## 2023-09-15 MED ORDER — DOCUSATE SODIUM 100 MG PO CAPS
100.0000 mg | ORAL_CAPSULE | Freq: Two times a day (BID) | ORAL | Status: DC
  Administered 2023-09-15: 100 mg via ORAL
  Filled 2023-09-15: qty 1

## 2023-09-15 MED ORDER — INSULIN ASPART 100 UNIT/ML IJ SOLN: 0.0000 [IU] | INTRAMUSCULAR | Status: DC | PRN

## 2023-09-15 MED ORDER — DEXAMETHASONE SODIUM PHOSPHATE 10 MG/ML IJ SOLN
INTRAMUSCULAR | Status: AC
  Filled 2023-09-15: qty 1

## 2023-09-15 MED ORDER — METOCLOPRAMIDE HCL 5 MG/ML IJ SOLN: 5.0000 mg | Freq: Three times a day (TID) | INTRAMUSCULAR | Status: DC | PRN

## 2023-09-15 MED ORDER — PROPOFOL 10 MG/ML IV BOLUS
INTRAVENOUS | Status: DC | PRN
Start: 2023-09-15 — End: 2023-09-15
  Administered 2023-09-15: 40 mg via INTRAVENOUS

## 2023-09-15 MED ORDER — BUPIVACAINE-EPINEPHRINE (PF) 0.5% -1:200000 IJ SOLN
INTRAMUSCULAR | Status: DC | PRN
  Administered 2023-09-15: 15 mL

## 2023-09-15 MED ORDER — POVIDONE-IODINE 7.5 % EX SOLN
Freq: Once | CUTANEOUS | Status: DC
  Filled 2023-09-15: qty 118

## 2023-09-15 MED ORDER — EPHEDRINE 5 MG/ML INJ
INTRAVENOUS | Status: AC
  Filled 2023-09-15: qty 5

## 2023-09-15 MED ORDER — LACTATED RINGERS IV SOLN: INTRAVENOUS | Status: DC

## 2023-09-15 MED ORDER — OXYCODONE HCL 5 MG PO TABS
5.0000 mg | ORAL_TABLET | ORAL | Status: DC | PRN
  Administered 2023-09-15 – 2023-09-16 (×3): 10 mg via ORAL
  Filled 2023-09-15 (×3): qty 2

## 2023-09-15 MED ORDER — METHOCARBAMOL 500 MG PO TABS
500.0000 mg | ORAL_TABLET | Freq: Four times a day (QID) | ORAL | Status: DC | PRN
  Administered 2023-09-15 – 2023-09-16 (×3): 500 mg via ORAL
  Filled 2023-09-15 (×2): qty 1

## 2023-09-15 MED ORDER — INSULIN ASPART 100 UNIT/ML IJ SOLN
4.0000 [IU] | Freq: Three times a day (TID) | INTRAMUSCULAR | Status: DC
  Administered 2023-09-16: 4 [IU] via SUBCUTANEOUS

## 2023-09-15 MED ORDER — ONDANSETRON HCL 4 MG/2ML IJ SOLN
INTRAMUSCULAR | Status: AC
  Filled 2023-09-15: qty 2

## 2023-09-15 MED ORDER — HYDROMORPHONE HCL 1 MG/ML IJ SOLN
INTRAMUSCULAR | Status: AC
  Filled 2023-09-15: qty 1

## 2023-09-15 MED ORDER — ACETAMINOPHEN 500 MG PO TABS: 1000.0000 mg | ORAL_TABLET | Freq: Once | ORAL | Status: AC

## 2023-09-15 MED ORDER — SUGAMMADEX SODIUM 200 MG/2ML IV SOLN
INTRAVENOUS | Status: DC | PRN
  Administered 2023-09-15: 200 mg via INTRAVENOUS

## 2023-09-15 MED ORDER — ACETAMINOPHEN 10 MG/ML IV SOLN
INTRAVENOUS | Status: AC
  Filled 2023-09-15: qty 100

## 2023-09-15 MED ORDER — GABAPENTIN 300 MG PO CAPS
900.0000 mg | ORAL_CAPSULE | Freq: Every day | ORAL | Status: DC
  Administered 2023-09-15: 900 mg via ORAL
  Filled 2023-09-15: qty 3

## 2023-09-15 MED ORDER — LOSARTAN POTASSIUM-HCTZ 100-25 MG PO TABS: 1.0000 | ORAL_TABLET | Freq: Every day | ORAL | Status: DC

## 2023-09-15 MED ORDER — HYDROCHLOROTHIAZIDE 25 MG PO TABS: 25.0000 mg | ORAL_TABLET | Freq: Every day | ORAL | Status: DC

## 2023-09-15 SURGICAL SUPPLY — 76 items
AID PSTN UNV HD RSTRNT DISP (MISCELLANEOUS) ×1
ALCOHOL 70% 16 OZ (MISCELLANEOUS) ×1 IMPLANT
APL PRP STRL LF DISP 70% ISPRP (MISCELLANEOUS) ×1
BAG COUNTER SPONGE SURGICOUNT (BAG) ×1 IMPLANT
BAG SPNG CNTER NS LX DISP (BAG) ×1
BASEPLATE AUG MED W-TAPER (Plate) IMPLANT
BEARING HUMERAL SHLDER 36M STD (Shoulder) IMPLANT
BIT DRILL 2.7 W/STOP DISP (BIT) IMPLANT
BIT DRILL QUICK REL 1/8 2PK SL (BIT) IMPLANT
BIT DRILL TWIST 2.7 (BIT) IMPLANT
BLADE SAW SGTL 13X75X1.27 (BLADE) ×1 IMPLANT
BRNG HUM STD 36 RVRS SHLDR (Shoulder) ×1 IMPLANT
BSPLAT GLND MED AUG TPR ADPR (Plate) ×1 IMPLANT
CHLORAPREP W/TINT 26 (MISCELLANEOUS) ×1 IMPLANT
COOLER ICEMAN CLASSIC (MISCELLANEOUS) ×1 IMPLANT
COVER SURGICAL LIGHT HANDLE (MISCELLANEOUS) ×1 IMPLANT
DRAPE INCISE IOBAN 66X45 STRL (DRAPES) ×1 IMPLANT
DRAPE U-SHAPE 47X51 STRL (DRAPES) ×2 IMPLANT
DRESSING PEEL AND PLAC PRVNA20 (GAUZE/BANDAGES/DRESSINGS) IMPLANT
DRSG AQUACEL AG ADV 3.5X10 (GAUZE/BANDAGES/DRESSINGS) ×1 IMPLANT
DRSG PEEL AND PLACE PREVENA 20 (GAUZE/BANDAGES/DRESSINGS) ×1
ELECT BLADE 4.0 EZ CLEAN MEGAD (MISCELLANEOUS) ×1
ELECT REM PT RETURN 9FT ADLT (ELECTROSURGICAL) ×1
ELECTRODE BLDE 4.0 EZ CLN MEGD (MISCELLANEOUS) ×1 IMPLANT
ELECTRODE REM PT RTRN 9FT ADLT (ELECTROSURGICAL) ×1 IMPLANT
GAUZE SPONGE 4X4 12PLY STRL LF (GAUZE/BANDAGES/DRESSINGS) ×1 IMPLANT
GLENOID SPHERE 36+6 (Joint) IMPLANT
GLOVE BIOGEL PI IND STRL 6.5 (GLOVE) ×1 IMPLANT
GLOVE BIOGEL PI IND STRL 8 (GLOVE) ×1 IMPLANT
GLOVE ECLIPSE 6.5 STRL STRAW (GLOVE) ×1 IMPLANT
GLOVE ECLIPSE 8.0 STRL XLNG CF (GLOVE) ×1 IMPLANT
GOWN STRL REUS W/ TWL LRG LVL3 (GOWN DISPOSABLE) ×1 IMPLANT
GOWN STRL REUS W/ TWL XL LVL3 (GOWN DISPOSABLE) ×1 IMPLANT
GOWN STRL REUS W/TWL LRG LVL3 (GOWN DISPOSABLE) ×1
GOWN STRL REUS W/TWL XL LVL3 (GOWN DISPOSABLE) ×1
GUIDE BONE RSA SHLD ROT RT (ORTHOPEDIC DISPOSABLE SUPPLIES) IMPLANT
HYDROGEN PEROXIDE 16OZ (MISCELLANEOUS) ×1 IMPLANT
JET LAVAGE IRRISEPT WOUND (IRRIGATION / IRRIGATOR) ×1
KIT BASIN OR (CUSTOM PROCEDURE TRAY) ×1 IMPLANT
KIT TURNOVER KIT B (KITS) ×1 IMPLANT
LAVAGE JET IRRISEPT WOUND (IRRIGATION / IRRIGATOR) ×1 IMPLANT
MANIFOLD NEPTUNE II (INSTRUMENTS) ×1 IMPLANT
NDL SUT 6 .5 CRC .975X.05 MAYO (NEEDLE) IMPLANT
NEEDLE MAYO TAPER (NEEDLE)
NS IRRIG 1000ML POUR BTL (IV SOLUTION) ×1 IMPLANT
PACK SHOULDER (CUSTOM PROCEDURE TRAY) ×1 IMPLANT
PAD COLD SHLDR WRAP-ON (PAD) ×1 IMPLANT
PIN THREADED REVERSE (PIN) IMPLANT
REAMER GUIDE BUSHING SURG DISP (MISCELLANEOUS) IMPLANT
REAMER GUIDE W/SCREW AUG (MISCELLANEOUS) IMPLANT
RESTRAINT HEAD UNIVERSAL NS (MISCELLANEOUS) ×1 IMPLANT
RETRIEVER SUT HEWSON (MISCELLANEOUS) ×1 IMPLANT
SCREW BONE CORT 6.5X35MM (Screw) IMPLANT
SCREW LOCKING 4.75MMX15MM (Screw) IMPLANT
SCREW LOCKING NS 4.75MMX20MM (Screw) IMPLANT
SCREW LOCKING STRL 4.75X25X3.5 (Screw) IMPLANT
SHOULDER HUMERAL BEAR 36M STD (Shoulder) ×1 IMPLANT
SLING ARM IMMOBILIZER LRG (SOFTGOODS) ×1 IMPLANT
SOL PREP POV-IOD 4OZ 10% (MISCELLANEOUS) ×1 IMPLANT
SPONGE T-LAP 18X18 ~~LOC~~+RFID (SPONGE) ×1 IMPLANT
STEM SHOULDER (Stem) IMPLANT
STRIP CLOSURE SKIN 1/2X4 (GAUZE/BANDAGES/DRESSINGS) ×1 IMPLANT
SUCTION TUBE FRAZIER 10FR DISP (SUCTIONS) ×1 IMPLANT
SUT BROADBAND TAPE 2PK 1.5 (SUTURE) IMPLANT
SUT MNCRL AB 3-0 PS2 18 (SUTURE) ×1 IMPLANT
SUT SILK 2 0 TIES 10X30 (SUTURE) ×1 IMPLANT
SUT VIC AB 0 CT1 27 (SUTURE) ×4
SUT VIC AB 0 CT1 27XBRD ANBCTR (SUTURE) ×4 IMPLANT
SUT VIC AB 1 CT1 27 (SUTURE) ×2
SUT VIC AB 1 CT1 27XBRD ANBCTR (SUTURE) ×2 IMPLANT
SUT VIC AB 2-0 CT1 27 (SUTURE) ×3
SUT VIC AB 2-0 CT1 TAPERPNT 27 (SUTURE) ×3 IMPLANT
SUT VICRYL 0 UR6 27IN ABS (SUTURE) ×2 IMPLANT
TOWEL GREEN STERILE (TOWEL DISPOSABLE) ×1 IMPLANT
TRAY HUM REV SHOULDER STD +6 (Shoulder) IMPLANT
WATER STERILE IRR 1000ML POUR (IV SOLUTION) ×1 IMPLANT

## 2023-09-15 NOTE — Brief Op Note (Signed)
   09/15/2023  11:08 AM  PATIENT:  [name]  62 y.o. female  PRE-OPERATIVE DIAGNOSIS:  right shoulder osteoarthritis, biceps tendinitis  POST-OPERATIVE DIAGNOSIS:  right shoulder osteoarthritis, biceps tendinitis  PROCEDURE:  Procedure(s): REVERSE SHOULDER ARTHROPLASTY BICEPS TENODESIS  SURGEON:  Surgeon(s): Cammy Copa, MD  ASSISTANT: magnant pa  ANESTHESIA:   general  EBL: 125 ml    Total I/O In: 1750 [I.V.:1300; IV Piggyback:450] Out: 125 [Blood:125]  BLOOD ADMINISTERED: none  DRAINS: none   LOCAL MEDICATIONS USED:  vanco SPECIMEN:  No Specimen  COUNTS:  YES  TOURNIQUET:  * No tourniquets in log *  DICTATION: .Other Dictation: Dictation Number [id number]  PLAN OF CARE: Admit for overnight observation  PATIENT DISPOSITION:  PACU - hemodynamically stable

## 2023-09-15 NOTE — Evaluation (Signed)
Physical Therapy Evaluation Patient Details Name: [name] MRN: [id number] DOB: May 29, 1961 Today's Date: 09/15/2023  History of Present Illness  Patient is 62 y.o. female s/p Rt reverse TSA on 09/15/23. PMH significant for anemia, anxiety, depression, asthma, CAD, COPD on 3L/min at baseline, breast cancer, DM, HTN, HLD, morbid obesity, bil TKA, Rt TSA.   Clinical Impression  [name] is a 62 y.o. female POD 0 s/p Rt reverse TSA. Patient reports mod ind with mobility at baseline. Reviewed NWB of Rt UE and sling positioning at start of session. Patient is now limited by functional impairments (see PT problem list below) and requires supervision/CGA for transfers and gait with IV pole; occasional assist needed during gait to steady and manage IV pole. Patient was able to ambulate ~40 feet with RW and CGA/min assist. Pt was able to complete toilet transfer but unable to perform pericare and required Max assist. Patient will benefit from continued skilled PT interventions to address impairments and progress towards PLOF. Acute PT will follow to progress mobility and stair training in preparation for safe discharge home.         If plan is discharge home, recommend the following: A little help with walking and/or transfers;A lot of help with bathing/dressing/bathroom;Assistance with cooking/housework;Assist for transportation;Help with stairs or ramp for entrance   Can travel by private vehicle        Equipment Recommendations Other (comment) (discussed benefits of Rollator when pt recovered from shoulder surgery)  Recommendations for Other Services       Functional Status Assessment Patient has had a recent decline in their functional status and demonstrates the ability to make significant improvements in function in a reasonable and predictable amount of time.     Precautions / Restrictions Precautions Precautions: Fall Precaution Comments: watch O2 sats Required Braces  or Orthoses: Sling Restrictions Weight Bearing Restrictions: Yes RUE Weight Bearing: Non weight bearing      Mobility  Bed Mobility Overal bed mobility: Modified Independent             General bed mobility comments: use of bed features pt able to move LEs off/on bed and move supine<>sit    Transfers Overall transfer level: Needs assistance Equipment used: None Transfers: Sit to/from Stand Sit to Stand: Supervision           General transfer comment: sup for safety with Lt UE to power up from EOB. pt using grab bar in bathroom.    Ambulation/Gait Ambulation/Gait assistance: Contact guard assist Gait Distance (Feet): 40 Feet Assistive device: IV Pole Gait Pattern/deviations: Step-through pattern, Decreased step length - right, Decreased step length - left, Decreased stride length, Shuffle, Wide base of support Gait velocity: decr     General Gait Details: step width to accomodate body habitus, wide overall steady steps. pt required close CGA and occasional min assist to manage IV pole. pt able to steady self with Lt UE on IV pole for support. demonstrated good awareness to lines.  Stairs            Wheelchair Mobility     Tilt Bed    Modified Rankin (Stroke Patients Only)       Balance Overall balance assessment: Needs assistance Sitting-balance support: Feet supported Sitting balance-Leahy Scale: Fair     Standing balance support: Single extremity supported, During functional activity, Reliant on assistive device for balance Standing balance-Leahy Scale: Poor  Pertinent Vitals/Pain Pain Assessment Pain Assessment: No/denies pain    Home Living Family/patient expects to be discharged to:: Private residence Living Arrangements: Spouse/significant other;Non-relatives/Friends Available Help at Discharge: Family Type of Home: House Home Access: Stairs to enter;Ramped entrance (ramp has  handrails) Entrance Stairs-Rails: None Entrance Stairs-Number of Steps: 3   Home Layout: One level Home Equipment: Cane - single point;BSC/3in1;Tub bench;Grab bars - tub/shower;Grab bars - toilet;Electric scooter      Prior Function Prior Level of Function : Independent/Modified Independent             Mobility Comments: pt uses SPC for mobility and is typically independent for short household distances. pt limited in community and has Art gallery manager. pt does not have scooter lift or accesible van for scooter and relys on friends to lift scooter out of van/car to use in community. ADLs Comments: pt reports typically independent with household tasks of cooking and cleaning. she sits on a stool when cooking for rest breaks. pt can perform self care at mod Ind level, uses tub bench, typically washes and wipes with Rt UE, Lt UE is limited and pt is concerned she will not bed able to perform ADL's with Lt UE.     Extremity/Trunk Assessment   Upper Extremity Assessment Upper Extremity Assessment: Defer to OT evaluation;RUE deficits/detail RUE: Unable to fully assess due to immobilization    Lower Extremity Assessment Lower Extremity Assessment: Generalized weakness    Cervical / Trunk Assessment Cervical / Trunk Assessment: Other exceptions;Back Surgery Cervical / Trunk Exceptions: hx of back sx, body habitus  Communication   Communication Communication: No apparent difficulties  Cognition Arousal: Alert Behavior During Therapy: WFL for tasks assessed/performed Overall Cognitive Status: Within Functional Limits for tasks assessed                                          General Comments      Exercises     Assessment/Plan    PT Assessment Patient needs continued PT services  PT Problem List Decreased strength;Decreased activity tolerance;Decreased balance;Decreased mobility;Decreased safety awareness;Decreased knowledge of use of DME;Decreased knowledge  of precautions;Cardiopulmonary status limiting activity;Obesity       PT Treatment Interventions DME instruction;Gait training;Stair training;Functional mobility training;Therapeutic activities;Therapeutic exercise;Balance training;Neuromuscular re-education;Cognitive remediation;Patient/family education;Wheelchair mobility training    PT Goals (Current goals can be found in the Care Plan section)  Acute Rehab PT Goals Patient Stated Goal: get home and recover PT Goal Formulation: With patient Time For Goal Achievement: 09/22/23 Potential to Achieve Goals: Good    Frequency Min 1X/week     Co-evaluation               AM-PAC PT "6 Clicks" Mobility  Outcome Measure Help needed turning from your back to your side while in a flat bed without using bedrails?: A Little Help needed moving from lying on your back to sitting on the side of a flat bed without using bedrails?: None Help needed moving to and from a bed to a chair (including a wheelchair)?: A Little Help needed standing up from a chair using your arms (e.g., wheelchair or bedside chair)?: A Little Help needed to walk in hospital room?: A Little Help needed climbing 3-5 steps with a railing? : A Lot 6 Click Score: 18    End of Session Equipment Utilized During Treatment: Gait belt;Oxygen (Rt UE sling; pt on 3L/min) Activity  Tolerance: Patient tolerated treatment well Patient left: in bed;with call bell/phone within reach;with SCD's reapplied Nurse Communication: Mobility status PT Visit Diagnosis: Unsteadiness on feet (R26.81);Other abnormalities of gait and mobility (R26.89);Muscle weakness (generalized) (M62.81);Difficulty in walking, not elsewhere classified (R26.2)    Time: 4098-1191 PT Time Calculation (min) (ACUTE ONLY): 26 min   Charges:   PT Evaluation $PT Eval Low Complexity: 1 Low PT Treatments $Gait Training: 8-22 mins PT General Charges $$ ACUTE PT VISIT: 1 Visit         Wynn Maudlin,  DPT Acute Rehabilitation Services Office [phone]  09/15/23 4:59 PM

## 2023-09-15 NOTE — Anesthesia Postprocedure Evaluation (Signed)
Anesthesia Post Note  Patient: [name]  Procedure(s) Performed: REVERSE SHOULDER ARTHROPLASTY (Right: Shoulder) BICEPS TENODESIS (Right: Shoulder)     Patient location during evaluation: PACU Anesthesia Type: General and Regional Level of consciousness: awake and alert Pain management: pain level controlled Vital Signs Assessment: post-procedure vital signs reviewed and stable Respiratory status: spontaneous breathing, nonlabored ventilation, respiratory function stable and patient connected to nasal cannula oxygen Cardiovascular status: blood pressure returned to baseline and stable Postop Assessment: no apparent nausea or vomiting Anesthetic complications: no  No notable events documented.  Last Vitals:  Vitals:   09/15/23 1230 09/15/23 1300  BP: (!) 144/76 135/78  Pulse: 75 76  Resp: 20 20  Temp: 36.6 C 36.7 C  SpO2: 93% 94%    Last Pain:  Vitals:   09/15/23 1300  TempSrc:   PainSc: 0-No pain                 Sade Mehlhoff,W. EDMOND

## 2023-09-15 NOTE — Anesthesia Procedure Notes (Signed)
Anesthesia Regional Block: Interscalene brachial plexus block   Pre-Anesthetic Checklist: , timeout performed,  Correct Patient, Correct Site, Correct Laterality,  Correct Procedure, Correct Position, site marked,  Risks and benefits discussed,  Pre-op evaluation,  At surgeon's request and post-op pain management  Laterality: Right  Prep: Maximum Sterile Barrier Precautions used, chloraprep       Needles:  Injection technique: Single-shot  Needle Type: Echogenic Stimulator Needle     Needle Length: 5cm  Needle Gauge: 22     Additional Needles:   Procedures:,,,, ultrasound used (permanent image in chart),,    Narrative:  Start time: 09/15/2023 7:15 AM End time: 09/15/2023 7:25 AM Injection made incrementally with aspirations every 5 mL.  Performed by: Personally  Anesthesiologist: Gaynelle Adu, MD  Additional Notes:

## 2023-09-15 NOTE — Op Note (Unsigned)
NAME: [name], [name] MEDICAL RECORD NO: [id number] ACCOUNT NO: [id number] DATE OF BIRTH: 1961/07/31 FACILITY: [facility] LOCATION: [facility] PHYSICIAN: Graylin Shiver. August Saucer, MD  Operative Report   DATE OF PROCEDURE: 09/15/2023  PREOPERATIVE DIAGNOSIS:  Severe end-stage right shoulder arthritis and biceps tendinitis.  POSTOPERATIVE DIAGNOSIS:  Severe end-stage right shoulder arthritis and biceps tendinitis.  PROCEDURE:  Right reverse shoulder replacement utilizing comprehensive Biomet reverse shoulder medium augmented baseplate 36+6 glenosphere, mini humeral stem, size 10, mini humeral tray +6 tapered offset, 40 mm with standard 36 mm bearing.  Biceps  tenodesis.  SURGEON:  Graylin Shiver. August Saucer, MD  ASSISTANT:  Karenann Cai.  INDICATIONS:  This is a 62 year old patient with multiple medical problems who has done well with her left reverse shoulder replacement.  She presents now with endstage arthritis and significant tendinosis of the rotator cuff tendons as well as biceps  tendinitis, which has been refractory to nonoperative management.  DESCRIPTION OF PROCEDURE:  The patient was brought to the operating room where general anesthetic was induced.  Preoperative antibiotics administered.  Timeout was called.  Right shoulder, arm and hand prescrubbed with hydrogen peroxide followed by  alcohol and then Betadine, which was allowed to air dry then ChloraPrep solution and then draped in sterile manner.  Ioban used to seal the operative field and cover the operative field.  The patient's head was in neutral position in the beach chair  position.  After calling timeout, deltopectoral approach was made.  Cephalic vein was mobilized laterally.  Crossing veins ligated.  Deltoid elevated off its anterior attachment manually to enhance visualization.  The axillary nerve was palpated and  protected at all times during the case.  Upper 1.5 cm of the pec was released.  Subacromial and subdeltoid adhesions were  manually removed.  Biceps tendon was then tenodesed to the pec tendon as well as surrounding soft tissue using 5-0 Vicryl sutures.   Biceps tendon was then cut and the end of the biceps tendon was then used to unroof the transverse humeral ligament as well as the rotator interval to the base of the coracoid.  This was tagged with 2-0 Vicryl suture.  Circumflex vessels were then  ligated with 2-0 silk sutures.  Subscap was then detached from the lesser tuberosity.  Initially using a #15 blade scalpel and then electrocautery.  Dissection performed down about 2 cm below the inferior neck of the humerus.  Soft tissue removed around  to the 5 o'clock position on the right shoulder.  The patient did have significant biceps tendinitis as well as severe arthritis and glenohumeral joint effusion.  Significant tendinosis also present in the supraspinatus and subscap tendon.  The head was  dislocated.  Proximal reaming was then performed up to a size 10.  The head was then cut in approximately 30 degrees of retroversion, which matched the patient's native version.  Broaching then performed up to size 10 and a cap was placed.  Next,  posterior and anterior retractors were placed. We did achieve good exposure on the glenoid.  With the patient's muscle relaxers reversed the labrum was circumferentially excised and a Bankart lesion created from the 12 o'clock to 6 o'clock position  anteriorly on the right shoulder.  Anterior retractor placed.  Next, guide pins were placed in accordance with preoperative templating and modeling.  The glenoid was then reamed to the appropriate depth as well.  Reaming for the augment was also  performed.  Trial reduction with the augment gave excellent contact of the  trial on the glenoid.  Irrigation with IrriSept solution was performed after the incision as well as after the arthrotomy and multiple times during the case.  The baseplate was  then placed and we achieve a very good compression  screw fixation with the central screw and then four peripheral locking screws were placed.  Trial reduction was then performed with multiple combinations.  The most stable combination occurred with the  +6 glenosphere and the 40 mm +6 offset tray with the standard liner.  This gave excellent stability to adduction, extension as well as a forward force as well as with internal and external rotation of the arm.  The shoulder was difficult to relocate and  difficult to reduce.  Trial components were removed. Glenosphere was placed in neutral position.  Next tapes were placed through the lesser tuberosity and the canal was irrigated with IrriSept solution, which was then removed and then vancomycin placed  into the canal.  The stem was placed with very good press fit obtained.  Trial reduction again performed with the +6 offset tray and standard liner.  This gave very good stability.  This true implant was placed onto the dried [name] taper.  Same stability  parameters were maintained.  Axillary nerve again palpated and found to be intact.  Pouring irrigation x4 liters utilized.  Subscap then repaired using the 6 suture tapes using Nice knots with the arm in 30 degrees of external rotation.  Prior to  rotator interval closure, we irrigated the implant with IrriSept solution then placed vancomycin powder on the implant.  Rotator interval was then closed using #1 Vicryl suture with the arm in 30 degrees of external rotation.  Next, the deltopectoral  interval was closed using #1 Vicryl suture followed by interrupted inverted 0 Vicryl suture, 2-0 Vicryl suture, and 3-0 Monocryl.  Incisional wound VAC placed.  Shoulder sling placed.  The patient tolerated the procedure well without immediate  complications, transferred to the recovery room in stable condition.  Luke's assistance was required at all times for retraction, opening, closing, mobilization of tissue.  His assistance was a medical necessity.   PUS D:  09/15/2023 11:17:00 am T: 09/15/2023 12:16:00 pm  JOB: [id number]/ [id number]

## 2023-09-15 NOTE — Anesthesia Procedure Notes (Signed)
Procedure Name: Intubation Date/Time: 09/15/2023 7:42 AM  Performed by: Shary Decamp, CRNAPre-anesthesia Checklist: Patient identified, Patient being monitored, Timeout performed, Emergency Drugs available and Suction available Patient Re-evaluated:Patient Re-evaluated prior to induction Oxygen Delivery Method: Circle System Utilized Preoxygenation: Pre-oxygenation with 100% oxygen Induction Type: IV induction Ventilation: Mask ventilation without difficulty Laryngoscope Size: Miller and 2 Grade View: Grade I Tube type: Oral Tube size: 7.5 mm Number of attempts: 1 Airway Equipment and Method: Stylet Placement Confirmation: ETT inserted through vocal cords under direct vision, positive ETCO2 and breath sounds checked- equal and bilateral Secured at: 21 cm Tube secured with: Tape Dental Injury: Teeth and Oropharynx as per pre-operative assessment

## 2023-09-15 NOTE — H&P (Signed)
[name] is an 62 y.o. female.   Chief Complaint: Right shoulder pain HPI:  [name] is a 61 y.o. female who presents to the office reporting right shoulder pain.  She is done well with her left reverse shoulder replacement.  In addition to the reverse shoulder replacement on the left-hand side she underwent biceps tenodesis.  The right shoulder pain bothers her at night and with ADLs.  Has difficulty with overhead motion.  Also reports weakness in that right arm..  She does localize pain diffusely in the shoulder joint along with focally in the bicipital groove.  Describes night pain as well as some rest pain in that right shoulder.  Patient has nearly completed a course of p.o. antibiotics for possible UTI.  She denies any significant UTI type symptoms but has been taking antibiotics for equivocal UA and culture.  Past Medical History:  Diagnosis Date   Anemia    Anxiety    Aortic valve stenosis, severe    Arthritis    PAIN AND SEVERE OA LEFT KNEE ; S/P RIGHT TKA ON 02/03/12; HAS LOWER BACK PAIN-UNABLE TO STAND MORE THAN 10 MIN; ARTHRITIS "ALL OVER"   Asthma    Blood transfusion    2013[facility]   Breast cancer in female [facility])    Right   CAD (coronary artery disease)    Cath 2010 with DES x 1 RCA-- PT'S CARDIOLOGIST IS DR. MCALHANY   Chronic diastolic congestive heart failure (HCC)    COPD (chronic obstructive pulmonary disease) (HCC)    Mild   COPD exacerbation (HCC) 11/16/2022   Depression    Diabetes mellitus DIAGNOSED IN2010   Dyspnea    with much ambulation   Eczema    on back   Headache    migraines younger- rare now 02/07/21   Heart murmur    no current problems   History of hiatal hernia    History of kidney stones    passed or blasted   History of UTI 12/05/2022   Hyperlipidemia    Hypertension    Morbid obesity with body mass index of 60.0-69.9 in adult [facility])    Myocardial infarction (HCC)    PT THINKS SHE WAS DX WITH MI AT THE TIME OF HEART STENTING    Neuromuscular disorder (HCC)    bilateral arm/hands   Oxygen dependent    uses 3L oxygen night/prn   Personal history of radiation therapy    Pneumonia    Pulmonary embolism (HCC) 02/08/2012   S/P RT TOTAL KNEE ON 02/03/12--ON 02/08/12--DEVELOPED ACUTE SOB AND CHEST PAIN--AND DIAGNOSED WITH  PULMONARY EMBOLUS AND PNEUMONIA   Restless leg syndrome    Sleep apnea    Uterine fibroid    NO PROBLEMS AT PRESENT FROM THE FIBROIDS-STATES SHE IS POST MENOPAUSAL-LAST MENSES 2010 EXCEPT FOR EPISODE THIS YR OF BLEEDING RELATED TO FIBROIDS.   Weakness    BOTH HANDS - S/P BILATERAL CARPAL TUNNEL RELEASE--BUT STILL HAS WEAKNESS--OFTEN DROPS THINGS    Past Surgical History:  Procedure Laterality Date   BACK SURGERY  02/11/2021   Dr Otelia Sergeant   BREAST BIOPSY Right 06/04/2018   BREAST LUMPECTOMY Right 06/2018   BREAST LUMPECTOMY WITH RADIOACTIVE SEED AND SENTINEL LYMPH NODE BIOPSY Right 07/19/2018   Procedure: BREAST LUMPECTOMY WITH RADIOACTIVE SEED AND SENTINEL LYMPH NODE BIOPSY;  Surgeon: Ovidio Kin, MD;  Location: [facility] OR;  Service: General;  Laterality: Right;   CARPAL TUNNEL RELEASE     Bilateral   CHOLECYSTECTOMY  COLONOSCOPY  2010   CORONARY ANGIOPLASTY     2010 has stent in place   CYSTOSCOPY W/ RETROGRADES Right 09/21/2013   Procedure: CYSTOSCOPY WITH RIGHT RETROGRADE PYELOGRAM RIGHT DOUBLE J STENT ;  Surgeon: Antony Haste, MD;  Location: [facility]S;  Service: Urology;  Laterality: Right;   CYSTOSCOPY W/ URETERAL STENT PLACEMENT Right 11/16/2022   Procedure: CYSTOSCOPY WITH RETROGRADE PYELOGRAM/URETERAL STENT PLACEMENT;  Surgeon: Despina Arias, MD;  Location: [facility] OR;  Service: Urology;  Laterality: Right;   CYSTOSCOPY WITH RETROGRADE PYELOGRAM, URETEROSCOPY AND STENT PLACEMENT Right 01/13/2023   Procedure: CYSTOSCOPY WITH RETROGRADE PYELOGRAM, URETEROSCOPY AND STENT EXCHANGE;  Surgeon: Milderd Meager., MD;  Location: [facility];  Service: Urology;  Laterality: Right;    CYSTOSCOPY WITH URETEROSCOPY AND STENT PLACEMENT Right 10/25/2013   Procedure: CYSTOSCOPY RIGHT URETEROSCOPY HOLMIUM LASER LITHO AND STENT PLACEMENT;  Surgeon: Antony Haste, MD;  Location: [facility];  Service: Urology;  Laterality: Right;   HERNIA REPAIR  1995   umbilical ,   HOLMIUM LASER APPLICATION Right 01/13/2023   Procedure: HOLMIUM LASER APPLICATION;  Surgeon: Milderd Meager., MD;  Location: [facility];  Service: Urology;  Laterality: Right;   INTRAOPERATIVE TRANSESOPHAGEAL ECHOCARDIOGRAM N/A 12/12/2014   Procedure: INTRAOPERATIVE TRANSESOPHAGEAL ECHOCARDIOGRAM;  Surgeon: Kathleene Hazel, MD;  Location: [facility] OR;  Service: Cardiovascular;  Laterality: N/A;   KNEE ARTHROPLASTY  02/03/2012   Procedure: COMPUTER ASSISTED TOTAL KNEE ARTHROPLASTY;  Surgeon: [name], MD;  Location: [facility];  Service: Orthopedics;  Laterality: Right;  Right total knee arthroplasty   LEFT AND RIGHT HEART CATHETERIZATION WITH CORONARY ANGIOGRAM N/A 03/17/2013   Procedure: LEFT AND RIGHT HEART CATHETERIZATION WITH CORONARY ANGIOGRAM;  Surgeon: Kathleene Hazel, MD;  Location: [facility] CATH LAB;  Service: Cardiovascular;  Laterality: N/A;   LEFT AND RIGHT HEART CATHETERIZATION WITH CORONARY/GRAFT ANGIOGRAM N/A 09/14/2014   Procedure: LEFT AND RIGHT HEART CATHETERIZATION WITH Isabel Caprice;  Surgeon: Kathleene Hazel, MD;  Location: [facility] CATH LAB;  Service: Cardiovascular;  Laterality: N/A;   REVERSE SHOULDER ARTHROPLASTY Left 03/06/2022   Procedure: LEFT SHOULDER REPLACEMENT APPLICATION OF WOUND VAC;  Surgeon: Cammy Copa, MD;  Location: [facility];  Service: Orthopedics;  Laterality: Left;   TEE WITHOUT CARDIOVERSION N/A 03/14/2013   Procedure: TRANSESOPHAGEAL ECHOCARDIOGRAM (TEE);  Surgeon: Lewayne Bunting, MD;  Location: [facility] ENDOSCOPY;  Service: Cardiovascular;  Laterality: N/A;   TEE WITHOUT CARDIOVERSION N/A 11/14/2014   Procedure: TRANSESOPHAGEAL ECHOCARDIOGRAM (TEE);   Surgeon: Lewayne Bunting, MD;  Location: [facility] ENDOSCOPY;  Service: Cardiovascular;  Laterality: N/A;   TONSILLECTOMY     maybe as a child- does not know   TOTAL KNEE ARTHROPLASTY  09/10/2012   Procedure: TOTAL KNEE ARTHROPLASTY;  Surgeon: [name], MD;  [facility]: [facility];  Service: Orthopedics;  Laterality: Left;   TOTAL KNEE REVISION Right 07/15/2013   Procedure: REVISION ARTHROPLASTY RIGHT KNEE;  Surgeon: [name], MD;  Location: [facility];  Service: Orthopedics;  Laterality: Right;   TRANSCATHETER AORTIC VALVE REPLACEMENT, TRANSFEMORAL N/A 12/12/2014   Procedure: TRANSCATHETER AORTIC VALVE REPLACEMENT, TRANSFEMORAL;  Surgeon: Kathleene Hazel, MD;  Location: [facility];  Service: Cardiovascular;  Laterality: N/A;   TRIGGER FINGER RELEASE  09/10/2012   Procedure: RELEASE TRIGGER FINGER/A-1 PULLEY;  Surgeon: [name], MD;  Location: [facility];  Service: Orthopedics;  Laterality: Right;  Right Ring Finger   TUBAL LIGATION      Family History  Problem Relation Age of Onset   Breast cancer  Mother        stage IV at diagnosis   Emphysema Mother        smoked   Heart disease Mother    COPD Father        smoked   Asthma Father    Heart disease Father    Cancer Brother        Sinus   Colon cancer Neg Hx    Esophageal cancer Neg Hx    Inflammatory bowel disease Neg Hx    Liver disease Neg Hx    Pancreatic cancer Neg Hx    Rectal cancer Neg Hx    Stomach cancer Neg Hx    Social History:  reports that she quit smoking about 22 years ago. Her smoking use included cigarettes. She started smoking about 52 years ago. She has a 45 pack-year smoking history. She has never used smokeless tobacco. She reports that she does not currently use alcohol. She reports that she does not currently use drugs after having used the following drugs: Marijuana.  Allergies: No Known Allergies  Medications Prior to Admission  Medication Sig Dispense Refill   acetaminophen  (TYLENOL) 500 MG tablet Take 1,000 mg by mouth every 6 (six) hours as needed for moderate pain.     amLODipine (NORVASC) 5 MG tablet Take 1 tablet (5 mg total) by mouth daily. 90 tablet 2   atorvastatin (LIPITOR) 80 MG tablet Take 1 tablet (80 mg total) by mouth daily. 90 tablet 2   busPIRone (BUSPAR) 15 MG tablet Take 1 tablet (15 mg total) by mouth 2 (two) times daily. 60 tablet 3   cholecalciferol (VITAMIN D3) 25 MCG (1000 UNIT) tablet Take 1,000 Units by mouth daily.     clopidogrel (PLAVIX) 75 MG tablet TAKE 1 TABLET BY MOUTH IN THE MORNING 90 tablet 2   DULoxetine (CYMBALTA) 20 MG capsule Take 1 capsule (20 mg total) by mouth daily. 30 capsule 3   Evolocumab (REPATHA SURECLICK) 140 MG/ML SOAJ Inject 140 mg into the skin every 14 (fourteen) days. 6 mL 3   exemestane (AROMASIN) 25 MG tablet TAKE 1 TABLET BY MOUTH ONCE DAILY AFTER BREAKFAST 90 tablet 3   ezetimibe (ZETIA) 10 MG tablet Take 1 tablet by mouth once daily (Patient taking differently: Take 10 mg by mouth at bedtime.) 90 tablet 2   Fluticasone-Umeclidin-Vilant (TRELEGY ELLIPTA) 100-62.5-25 MCG/ACT AEPB INHALE 1 PUFF ONCE DAILY 180 each 0   gabapentin (NEURONTIN) 300 MG capsule TAKE 2 CAPSULES BY MOUTH IN THE MORNING AND 3 AT BEDTIME (Patient taking differently: Take 300-900 mg by mouth See admin instructions. TAKE 300 mg BY MOUTH IN THE MORNING AND 900 mg AT BEDTIME) 150 capsule 3   losartan-hydrochlorothiazide (HYZAAR) 100-25 MG tablet Take 1 tablet by mouth daily. Please repeat kidney labs to get additional refills 30 tablet 0   metFORMIN (GLUCOPHAGE-XR) 500 MG 24 hr tablet Take 1 tablet (500 mg total) by mouth 2 (two) times daily. 180 tablet 0   metoprolol succinate (TOPROL-XL) 50 MG 24 hr tablet TAKE 1 TABLET BY MOUTH ONCE DAILY WITH MEALS OR  IMMEDIATELY  FOLLOWING  A  MEAL 90 tablet 2   montelukast (SINGULAIR) 10 MG tablet TAKE 1 TABLET BY MOUTH AT BEDTIME 90 tablet 1   nitrofurantoin, macrocrystal-monohydrate, (MACROBID) 100 MG  capsule Take 1 capsule (100 mg total) by mouth 2 (two) times daily for 5 days. 10 capsule 0   Semaglutide, 2 MG/DOSE, 8 MG/3ML SOPN Inject 2 mg into the skin once  a week. 9 mL 1   traZODone (DESYREL) 50 MG tablet Take 1-2 tablets (50-100 mg total) by mouth at bedtime. 30 tablet 3   VENTOLIN HFA 108 (90 Base) MCG/ACT inhaler Inhale 2 puffs into the lungs every 6 (six) hours as needed for wheezing or shortness of breath. 18 g 3   Blood Pressure Monitoring (BLOOD PRESSURE CUFF) MISC Please follow insert instructions. 1 each 0   Continuous Glucose Sensor (FREESTYLE LIBRE 3 SENSOR) MISC PLACE 1 SENSOR ON THE SKIN EVERY 14 DAYS TO CHECK GLUCOSE CONTINUOUSLY 2 each 11   Continuous Glucose Sensor (FREESTYLE LIBRE 3 SENSOR) MISC Place 1 sensor on the skin every 14 days. Use to check glucose continuously 2 each 11   furosemide (LASIX) 40 MG tablet Take 1 tablet (40 mg total) by mouth as needed for fluid or edema. 180 tablet 1   hydrocortisone cream 1 % Apply 1 Application topically daily as needed for itching.     senna-docusate (SENNA-TIME S) 8.6-50 MG tablet Take 1 tablet by mouth at bedtime as needed for mild constipation. 30 tablet 1    Results for orders placed or performed during the hospital encounter of 09/15/23 (from the past 48 hour(s))  Glucose, capillary     Status: Abnormal   Collection Time: 09/15/23  5:51 AM  Result Value Ref Range   Glucose-Capillary 115 (H) 70 - 99 mg/dL    Comment: Glucose reference range applies only to samples taken after fasting for at least 8 hours.   *Note: Due to a large number of results and/or encounters for the requested time period, some results have not been displayed. A complete set of results can be found in Results Review.   No results found.  Review of Systems  Musculoskeletal:  Positive for arthralgias.  All other systems reviewed and are negative.   Blood pressure (!) 131/55, pulse 83, temperature 98.2 F (36.8 C), temperature source Oral, resp.  rate 18, height 5\' 1"  (1.549 m), weight 135.6 kg, last menstrual period 02/03/2012, SpO2 98%. Physical Exam Vitals reviewed.  HENT:     Head: Normocephalic.     Nose: Nose normal.     Mouth/Throat:     Mouth: Mucous membranes are moist.  Eyes:     Pupils: Pupils are equal, round, and reactive to light.  Cardiovascular:     Rate and Rhythm: Normal rate.     Pulses: Normal pulses.  Pulmonary:     Effort: Pulmonary effort is normal.  Abdominal:     General: Abdomen is flat.  Musculoskeletal:     Cervical back: Normal range of motion.  Skin:    General: Skin is warm.     Capillary Refill: Capillary refill takes less than 2 seconds.  Neurological:     General: No focal deficit present.     Mental Status: She is alert.  Psychiatric:        Mood and Affect: Mood normal.    Ortho exam demonstrates functional deltoid on the right.  Her range of motion is 30/80/120.  Actively she cannot really get the arm above 90 degrees of forward flexion and abduction.  Motor or sensory function to the hand is intact.  Radial pulses intact.  Skin intact in that right shoulder girdle region.  She has tenderness in the bicipital groove along with positive O'Brien's testing but no Popeye deformity is present.   Assessment/Plan Impression is right shoulder arthritis.  Has bone-on-bone changes.  Some weakness to supraspinatus testing.  Range  of motion becoming slightly more restricted.  Tenderness in the bicipital groove radiating to the biceps as well.  Very similar to the left-hand side.  Had a long talk with Lanora Manis about operative and nonoperative treatment options.  She wants to hold off on nonoperative treatment and proceed with reverse shoulder replacement and biceps tenodesis..  Patient is pleased with her level of function and pain relief from left reverse shoulder replacement and biceps tenodesis.  Thin cut CT scan performed for patient specific instrumentation to improve longevity and position of  implants.  The risks and benefits of reverse shoulder replacement discussed with the patient including not limited to infection or vessel damage incomplete pain relief as well as incomplete restoration of function and instability.  Patient understands risk and benefits and wishes to proceed.  All questions answered.   Burnard Bunting, MD 09/15/2023, 7:08 AM

## 2023-09-15 NOTE — Transfer of Care (Signed)
Immediate Anesthesia Transfer of Care Note  Patient: [name]  Procedure(s) Performed: REVERSE SHOULDER ARTHROPLASTY (Right: Shoulder) BICEPS TENODESIS (Right: Shoulder)  Patient Location: PACU  Anesthesia Type:GA combined with regional for post-op pain  Level of Consciousness: awake, alert , and patient cooperative  Airway & Oxygen Therapy: Patient Spontanous Breathing and Patient connected to face mask oxygen  Post-op Assessment: Report given to RN and Post -op Vital signs reviewed and stable  Post vital signs: Reviewed and stable  Last Vitals:  Vitals Value Taken Time  BP 152/51 09/15/23 1100  Temp 36.6 C 09/15/23 1100  Pulse 78 09/15/23 1110  Resp 20 09/15/23 1110  SpO2 96 % 09/15/23 1110  Vitals shown include unfiled device data.  Last Pain:  Vitals:   09/15/23 1100  TempSrc:   PainSc: Asleep         Complications: No notable events documented.

## 2023-09-16 ENCOUNTER — Encounter (HOSPITAL_COMMUNITY): Payer: Self-pay | Admitting: Orthopedic Surgery

## 2023-09-16 DIAGNOSIS — Z86711 Personal history of pulmonary embolism: Secondary | ICD-10-CM | POA: Diagnosis not present

## 2023-09-16 DIAGNOSIS — M19011 Primary osteoarthritis, right shoulder: Secondary | ICD-10-CM | POA: Diagnosis not present

## 2023-09-16 DIAGNOSIS — M7521 Bicipital tendinitis, right shoulder: Secondary | ICD-10-CM | POA: Diagnosis not present

## 2023-09-16 DIAGNOSIS — E119 Type 2 diabetes mellitus without complications: Secondary | ICD-10-CM | POA: Diagnosis not present

## 2023-09-16 DIAGNOSIS — I2510 Atherosclerotic heart disease of native coronary artery without angina pectoris: Secondary | ICD-10-CM | POA: Diagnosis not present

## 2023-09-16 DIAGNOSIS — Z955 Presence of coronary angioplasty implant and graft: Secondary | ICD-10-CM | POA: Diagnosis not present

## 2023-09-16 DIAGNOSIS — Z87891 Personal history of nicotine dependence: Secondary | ICD-10-CM | POA: Diagnosis not present

## 2023-09-16 DIAGNOSIS — J45909 Unspecified asthma, uncomplicated: Secondary | ICD-10-CM | POA: Diagnosis not present

## 2023-09-16 DIAGNOSIS — I110 Hypertensive heart disease with heart failure: Secondary | ICD-10-CM | POA: Diagnosis not present

## 2023-09-16 DIAGNOSIS — Z96653 Presence of artificial knee joint, bilateral: Secondary | ICD-10-CM | POA: Diagnosis not present

## 2023-09-16 DIAGNOSIS — J449 Chronic obstructive pulmonary disease, unspecified: Secondary | ICD-10-CM | POA: Diagnosis not present

## 2023-09-16 DIAGNOSIS — I5032 Chronic diastolic (congestive) heart failure: Secondary | ICD-10-CM | POA: Diagnosis not present

## 2023-09-16 DIAGNOSIS — Z7902 Long term (current) use of antithrombotics/antiplatelets: Secondary | ICD-10-CM | POA: Diagnosis not present

## 2023-09-16 LAB — GLUCOSE, CAPILLARY: Glucose-Capillary: 114 mg/dL — ABNORMAL HIGH (ref 70–99)

## 2023-09-16 MED ORDER — ASPIRIN 81 MG PO TBEC: 81.0000 mg | DELAYED_RELEASE_TABLET | Freq: Every day | ORAL | 12 refills | Status: AC

## 2023-09-16 MED ORDER — METHOCARBAMOL 500 MG PO TABS: 500.0000 mg | ORAL_TABLET | Freq: Three times a day (TID) | ORAL | 0 refills | Status: DC | PRN

## 2023-09-16 MED ORDER — OXYCODONE HCL 5 MG PO TABS: 5.0000 mg | ORAL_TABLET | ORAL | 0 refills | Status: DC | PRN

## 2023-09-16 NOTE — Progress Notes (Signed)
Physical Therapy Treatment Patient Details Name: [name] MRN: [id number] DOB: 04-14-1961 Today's Date: 09/16/2023   History of Present Illness Patient is 62 y.o. female s/p Rt reverse TSA on 09/15/23. PMH significant for anemia, anxiety, depression, asthma, CAD, COPD on 3L/min at baseline, breast cancer, DM, HTN, HLD, morbid obesity, bil TKA, Rt TSA.    PT Comments  Pt with continued progress towards acute goals, progressing gait distance with SPC use on L and CGA for safety. Pt needing cues for breathing techniques as SpO2 dropping to 81% on RA with activity, quickly rebounding >90 with reapplication of 3L O2. Educated pt on importance of continues mobility post acutely and appropriate activity progression. Pt continues to benefit from skilled PT services to progress toward functional mobility goals.     If plan is discharge home, recommend the following: A little help with walking and/or transfers;A lot of help with bathing/dressing/bathroom;Assistance with cooking/housework;Assist for transportation;Help with stairs or ramp for entrance   Can travel by private vehicle        Equipment Recommendations  Other (comment) (discussed benefits of Rollator when pt recovered from shoulder surgery)    Recommendations for Other Services       Precautions / Restrictions Precautions Precautions: Fall Precaution Comments: watch O2 sats Required Braces or Orthoses: Sling Restrictions Weight Bearing Restrictions: Yes RUE Weight Bearing: Non weight bearing     Mobility  Bed Mobility Overal bed mobility: Modified Independent             General bed mobility comments: upo EOB on arrival    Transfers Overall transfer level: Needs assistance Equipment used: None Transfers: Sit to/from Stand Sit to Stand: Supervision           General transfer comment: supervison for safety with Lt UE to power up from EOB. pt using grab bar in bathroom.     Ambulation/Gait Ambulation/Gait assistance: Contact guard assist Gait Distance (Feet): 80 Feet Assistive device: Straight cane Gait Pattern/deviations: Step-through pattern, Decreased step length - right, Decreased step length - left, Decreased stride length, Shuffle, Wide base of support, Step-to pattern Gait velocity: decr     General Gait Details: step width to accomodate body habitus, wide overall steady steps. pt required close CGA for safety fading to supervision with increased distance, pt moving to step-to pattern with fatigue   Stairs             Wheelchair Mobility     Tilt Bed    Modified Rankin (Stroke Patients Only)       Balance Overall balance assessment: Needs assistance Sitting-balance support: Feet supported Sitting balance-Leahy Scale: Fair     Standing balance support: Single extremity supported, During functional activity, Reliant on assistive device for balance Standing balance-Leahy Scale: Poor                              Cognition Arousal: Alert Behavior During Therapy: WFL for tasks assessed/performed Overall Cognitive Status: Within Functional Limits for tasks assessed                                          Exercises      General Comments General comments (skin integrity, edema, etc.): SpO2 to 82% with activity on RA, relaced 3L with quick improvement to 94%      Pertinent Vitals/Pain Pain Assessment  Pain Assessment: Faces Faces Pain Scale: Hurts a little bit Pain Location: R shoulder Pain Descriptors / Indicators: Operative site guarding, Sore Pain Intervention(s): Monitored during session, Limited activity within patient's tolerance    Home Living                          Prior Function            PT Goals (current goals can now be found in the care plan section) Acute Rehab PT Goals Patient Stated Goal: get home and recover PT Goal Formulation: With patient Time For Goal  Achievement: 09/22/23 Progress towards PT goals: Progressing toward goals    Frequency    Min 1X/week      PT Plan      Co-evaluation              AM-PAC PT "6 Clicks" Mobility   Outcome Measure  Help needed turning from your back to your side while in a flat bed without using bedrails?: A Little Help needed moving from lying on your back to sitting on the side of a flat bed without using bedrails?: None Help needed moving to and from a bed to a chair (including a wheelchair)?: A Little Help needed standing up from a chair using your arms (e.g., wheelchair or bedside chair)?: A Little Help needed to walk in hospital room?: A Little Help needed climbing 3-5 steps with a railing? : A Lot 6 Click Score: 18    End of Session Equipment Utilized During Treatment: Oxygen (Rt UE sling; pt on 3L/min) Activity Tolerance: Patient tolerated treatment well Patient left: in bed;with call bell/phone within reach Nurse Communication: Mobility status PT Visit Diagnosis: Unsteadiness on feet (R26.81);Other abnormalities of gait and mobility (R26.89);Muscle weakness (generalized) (M62.81);Difficulty in walking, not elsewhere classified (R26.2)     Time: 4010-2725 PT Time Calculation (min) (ACUTE ONLY): 14 min  Charges:    $Gait Training: 8-22 mins PT General Charges $$ ACUTE PT VISIT: 1 Visit                     Shamica Moree R. PTA Acute Rehabilitation Services Office: [phone]   Catalina Antigua 09/16/2023, 10:14 AM

## 2023-09-16 NOTE — Plan of Care (Signed)
Pt and husband given D/C instructions with verbal understanding. Rx's were sent to the pharmacy by MD. Pt's incision is clean and dry with no sign of infection. Pt's IV was removed prior to D/C. Wound vac was changed to the Prevena for home use. Pt instructed on home to care for the vac, verbal understanding provided. Pt D/C'd home via wheelchair per MD order. Pt is stable @ D/C and has no other needs at this time. Rema Fendt, RN

## 2023-09-16 NOTE — Evaluation (Signed)
Occupational Therapy Evaluation Patient Details Name: [name] MRN: [id number] DOB: July 24, 1961 Today's Date: 09/16/2023   History of Present Illness Patient is 62 y.o. female s/p Rt reverse TSA on 09/15/23. PMH significant for anemia, anxiety, depression, asthma, CAD, COPD on 3L/min at baseline, breast cancer, DM, HTN, HLD, morbid obesity, bil TKA, Rt TSA.   Clinical Impression   Pt admitted for above, presents with impaired use of dominant RUE. Pt demonstrated and verbalized understanding of R shoulder exercises (see below). She continues to present with pain but tolerated AAROM exercises very well. Discussed compensatory strategies and provided handouts for pericare AE, pt declined UB dressing but verbalized understanding with OT instruction. Pt would benefit from continue acute skilled OT services to continue to progress with ROM and with more strategies. Recommend pt follow physician recommendations at DC and begin outpatient OT services when physician deems appropriate.        If plan is discharge home, recommend the following: Assistance with cooking/housework;A lot of help with bathing/dressing/bathroom    Functional Status Assessment  Patient has had a recent decline in their functional status and demonstrates the ability to make significant improvements in function in a reasonable and predictable amount of time.  Equipment Recommendations  None recommended by OT (handout provided for toilet tongs)    Recommendations for Other Services       Precautions / Restrictions Precautions Precautions: Fall Precaution Comments: watch O2 sats Required Braces or Orthoses: Sling Restrictions Weight Bearing Restrictions: Yes RUE Weight Bearing: Non weight bearing      Mobility Bed Mobility Overal bed mobility: Modified Independent                  Transfers                   General transfer comment: defer      Balance Overall balance assessment: Needs  assistance Sitting-balance support: Feet supported Sitting balance-Leahy Scale: Fair     Standing balance support: Single extremity supported, During functional activity, Reliant on assistive device for balance Standing balance-Leahy Scale: Poor                             ADL either performed or assessed with clinical judgement   ADL Overall ADL's : Needs assistance/impaired Eating/Feeding: Set up;Sitting   Grooming: Sitting;Set up Grooming Details (indicate cue type and reason): simulated, disussed compenstory strategies for one hand but continuing to keep the RUE involved when able Upper Body Bathing: Set up;Standing   Lower Body Bathing: Sitting/lateral leans;Set up   Upper Body Dressing : Sitting;Moderate assistance Upper Body Dressing Details (indicate cue type and reason): Demonstrated and discussed with pt on hemi dressing technique, she declined need to actually complete UBD with techniques at this time but verbalized understanding Lower Body Dressing: Sitting/lateral leans;Supervision/safety Lower Body Dressing Details (indicate cue type and reason): Educated pt on one handed dressing technique to doff/don socks     Toileting- Clothing Manipulation and Hygiene: Maximal assistance Toileting - Clothing Manipulation Details (indicate cue type and reason): Discussed use of bottom buddy/toilet tongs, handout provided       General ADL Comments: Focused remainder of session on shoulder exercises instead of OOB mobility, pt to work with PT after session     Vision         Perception         Praxis         Pertinent Vitals/Pain  Pain Assessment Pain Assessment: 0-10 Pain Score: 8  Pain Location: R shoulder Pain Descriptors / Indicators: Operative site guarding, Sore, Aching Pain Intervention(s): Limited activity within patient's tolerance, Monitored during session, Premedicated before session     Extremity/Trunk Assessment Upper Extremity  Assessment Upper Extremity Assessment:  (L shoulder ext rotation restrictions) RUE: Unable to fully assess due to immobilization   Lower Extremity Assessment Lower Extremity Assessment: Generalized weakness   Cervical / Trunk Assessment Cervical / Trunk Assessment: Other exceptions;Back Surgery Cervical / Trunk Exceptions: hx of back sx, body habitus   Communication Communication Communication: No apparent difficulties   Cognition Arousal: Alert Behavior During Therapy: WFL for tasks assessed/performed Overall Cognitive Status: Within Functional Limits for tasks assessed                                       General Comments  VSS on 3L    Exercises Shoulder Exercises Pendulum Exercise: AROM, Seated, 10 reps Shoulder Flexion: AAROM, 10 reps, Seated Shoulder ABduction: AAROM, 5 reps Shoulder External Rotation: AAROM, 5 reps, Seated   Shoulder Instructions Shoulder Instructions Donning/doffing sling/immobilizer: Moderate assistance Pendulum exercises (written home exercise program): Supervision/safety (cues for proper technique) ROM for elbow, wrist and digits of operated UE: Independent    Home Living Family/patient expects to be discharged to:: Private residence Living Arrangements: Spouse/significant other;Non-relatives/Friends Available Help at Discharge: Family Type of Home: House Home Access: Stairs to enter;Ramped entrance (ramp has handrails) Entrance Stairs-Number of Steps: 3 Entrance Stairs-Rails: None Home Layout: One level     Bathroom Shower/Tub: Chief Strategy Officer: Standard Bathroom Accessibility: Yes   Home Equipment: Cane - single point;BSC/3in1;Tub bench;Grab bars - tub/shower;Grab bars - toilet;Electric scooter          Prior Functioning/Environment Prior Level of Function : Independent/Modified Independent             Mobility Comments: pt uses SPC for mobility and is typically independent for short  household distances. pt limited in community and has Art gallery manager. pt does not have scooter lift or accesible van for scooter and relys on friends to lift scooter out of van/car to use in community. ADLs Comments: pt reports typically independent with household tasks of cooking and cleaning. she sits on a stool when cooking for rest breaks. pt can perform self care at mod Ind level, uses tub bench, typically washes and wipes with Rt UE, Lt UE is limited and pt is concerned she will not be able to perform ADL's with Lt UE.        OT Problem List: Impaired UE functional use;Pain      OT Treatment/Interventions: Self-care/ADL training;Therapeutic exercise;DME and/or AE instruction;Balance training;Therapeutic activities;Patient/family education    OT Goals(Current goals can be found in the care plan section) Acute Rehab OT Goals Patient Stated Goal: To go home OT Goal Formulation: With patient Time For Goal Achievement: 09/30/23 Potential to Achieve Goals: Good ADL Goals Pt Will Perform Grooming: with modified independence;standing Pt Will Perform Toileting - Clothing Manipulation and hygiene: with modified independence;with adaptive equipment;sit to/from stand Pt/caregiver will Perform Home Exercise Program: Right Upper extremity;Increased ROM;With written HEP provided (AAROM and PROM of R shoulder)  OT Frequency: Min 2X/week    Co-evaluation              AM-PAC OT "6 Clicks" Daily Activity     Outcome Measure Help from another person eating  meals?: A Little Help from another person taking care of personal grooming?: A Little Help from another person toileting, which includes using toliet, bedpan, or urinal?: A Lot Help from another person bathing (including washing, rinsing, drying)?: A Little Help from another person to put on and taking off regular upper body clothing?: A Lot Help from another person to put on and taking off regular lower body clothing?: A Little 6 Click  Score: 16   End of Session Nurse Communication: Mobility status  Activity Tolerance: Patient tolerated treatment well Patient left: in bed;with call bell/phone within reach  OT Visit Diagnosis: Pain Pain - Right/Left: Right Pain - part of body: Shoulder                Time: 1610-9604 OT Time Calculation (min): 25 min Charges:  OT General Charges $OT Visit: 1 Visit OT Evaluation $OT Eval Moderate Complexity: 1 Mod OT Treatments $Therapeutic Activity: 8-22 mins  09/16/2023  AB, OTR/L  Acute Rehabilitation Services  Office: [phone]   Tristan Schroeder 09/16/2023, 10:29 AM

## 2023-09-16 NOTE — Progress Notes (Signed)
  Subjective: [name] is a 62 y.o. female s/p right RSA.  They are POD 1.  Pt's pain is controlled.  Patient denies any complaints of chest pain, shortness of breath, abdominal pain.  She was having a little bit of dyspnea yesterday but she states that she is back to baseline today.  She is on oxygen nasal cannula.  She does have an oxygen tank at home that she will utilize intermittently.  Objective: Vital signs in last 24 hours: Temp:  [97.5 F (36.4 C)-98.8 F (37.1 C)] 98.8 F (37.1 C) (09/18 0727) Pulse Rate:  [75-87] 84 (09/18 0727) Resp:  [15-24] 17 (09/18 0727) BP: (127-161)/(51-81) 127/65 (09/18 0727) SpO2:  [92 %-98 %] 98 % (09/18 0727)  Intake/Output from previous day: 09/17 0701 - 09/18 0700 In: 2450 [P.O.:600; I.V.:1300; IV Piggyback:550] Out: 125 [Blood:125] Intake/Output this shift: No intake/output data recorded.  Exam:  No gross blood or drainage overlying the dressing and wound VAC sponge in place with good suction.  No drainage in the wound VAC canister. 2+ radial pulse of the operative extremity Postoperative physical exam somewhat limited by interscalene block but intact EPL, FPL, finger abduction, grip strength, bicep flexion, deltoid abduction.   Labs: Recent Labs    09/14/23 1102  HGB 13.1   Recent Labs    09/14/23 1102  WBC 5.4  RBC 4.58  HCT 39.3  PLT 187   Recent Labs    09/14/23 1102  NA 139  K 3.8  CL 101  CO2 29  BUN 11  CREATININE 0.83  GLUCOSE 139*  CALCIUM 9.7   No results for input(s): "LABPT", "INR" in the last 72 hours.  Assessment/Plan: Pt is POD 1 s/p right RSA    -Plan to discharge to home today after working with occupational therapy and obtaining portable Prevena wound VAC.  As long as she is able to ambulate without any significant shortness of breath different than her baseline, she should be okay for discharge.  -No lifting with the operative arm  -Stay in sling except for showering/sleeping and using  CPM machine at home.  No lifting with the operative arm more than 1 to 2 pounds  -Follow-up with Dr. August Saucer in clinic 2 weeks postoperatively     [facility] Hights 09/16/2023, 8:34 AM

## 2023-09-16 NOTE — Discharge Summary (Signed)
Physician Discharge Summary      Patient ID: [name] MRN: [id number] DOB/AGE: Nov 13, 1961 62 y.o.  Admit date: 09/15/2023 Discharge date: 09/16/2023  Admission Diagnoses:  Principal Problem:   OA (osteoarthritis) of shoulder Active Problems:   S/P reverse total shoulder arthroplasty, right   Discharge Diagnoses:  Same  Surgeries: Procedure(s): REVERSE SHOULDER ARTHROPLASTY BICEPS TENODESIS on 09/15/2023   Consultants:   Discharged Condition: Stable  Hospital Course: GENEEN LEDESMA is an 62 y.o. female who was admitted 09/15/2023 with a chief complaint of right shoulder pain, and found to have a diagnosis of right shoulder osteoarthritis.  They were brought to the operating room on 09/15/2023 and underwent the above named procedures.  Pt awoke from anesthesia without complication and was transferred to the floor. On POD1, patient's pain was well-controlled.  She had no significant dyspnea aside from baseline.  No red flag signs or symptoms.  She is able to work with occupational therapy.  Discharged home on POD 1..  Pt will f/u with Dr. August Saucer in clinic in ~2 weeks.   Antibiotics given:  Anti-infectives (From admission, onward)    Start     Dose/Rate Route Frequency Ordered Stop   09/15/23 1600  ceFAZolin (ANCEF) IVPB 2g/100 mL premix        2 g 200 mL/hr over 30 Minutes Intravenous Every 8 hours 09/15/23 1327 09/16/23 0647   09/15/23 0814  vancomycin (VANCOCIN) powder  Status:  Discontinued          As needed 09/15/23 0814 09/15/23 1059   09/15/23 0600  ceFAZolin (ANCEF) IVPB 3g/100 mL premix        3 g 200 mL/hr over 30 Minutes Intravenous On call to O.R. 09/15/23 0549 09/15/23 0815   09/15/23 0554  ceFAZolin (ANCEF) 3-0.9 GM/100ML-% IVPB       Note to Pharmacy: Lacie Draft A: cabinet override      09/15/23 0554 09/15/23 0756     .  Recent vital signs:  Vitals:   09/16/23 0351 09/16/23 0727  BP: 132/68 127/65  Pulse: 80 84  Resp: 19 17  Temp: 98.4  F (36.9 C) 98.8 F (37.1 C)  SpO2: 95% 98%    Recent laboratory studies:  Results for orders placed or performed during the hospital encounter of 09/15/23  Glucose, capillary  Result Value Ref Range   Glucose-Capillary 115 (H) 70 - 99 mg/dL  Glucose, capillary  Result Value Ref Range   Glucose-Capillary 176 (H) 70 - 99 mg/dL  Glucose, capillary  Result Value Ref Range   Glucose-Capillary 179 (H) 70 - 99 mg/dL  Glucose, capillary  Result Value Ref Range   Glucose-Capillary 187 (H) 70 - 99 mg/dL  Glucose, capillary  Result Value Ref Range   Glucose-Capillary 114 (H) 70 - 99 mg/dL   *Note: Due to a large number of results and/or encounters for the requested time period, some results have not been displayed. A complete set of results can be found in Results Review.    Discharge Medications:   Allergies as of 09/16/2023   No Known Allergies      Medication List     TAKE these medications    acetaminophen 500 MG tablet Commonly known as: TYLENOL Take 1,000 mg by mouth every 6 (six) hours as needed for moderate pain.   amLODipine 5 MG tablet Commonly known as: NORVASC Take 1 tablet (5 mg total) by mouth daily.   aspirin EC 81 MG tablet Take 1 tablet (81 mg  total) by mouth daily. Swallow whole.   atorvastatin 80 MG tablet Commonly known as: Lipitor Take 1 tablet (80 mg total) by mouth daily.   Blood Pressure Cuff Misc Please follow insert instructions.   busPIRone 15 MG tablet Commonly known as: BUSPAR Take 1 tablet (15 mg total) by mouth 2 (two) times daily.   cholecalciferol 25 MCG (1000 UNIT) tablet Commonly known as: VITAMIN D3 Take 1,000 Units by mouth daily.   clopidogrel 75 MG tablet Commonly known as: PLAVIX TAKE 1 TABLET BY MOUTH IN THE MORNING   DULoxetine 20 MG capsule Commonly known as: Cymbalta Take 1 capsule (20 mg total) by mouth daily.   exemestane 25 MG tablet Commonly known as: AROMASIN TAKE 1 TABLET BY MOUTH ONCE DAILY AFTER  BREAKFAST   ezetimibe 10 MG tablet Commonly known as: ZETIA Take 1 tablet by mouth once daily What changed: when to take this   FreeStyle Libre 3 Sensor Misc PLACE 1 SENSOR ON THE SKIN EVERY 14 DAYS TO CHECK GLUCOSE CONTINUOUSLY   FreeStyle Libre 3 Sensor Misc Place 1 sensor on the skin every 14 days. Use to check glucose continuously   furosemide 40 MG tablet Commonly known as: LASIX Take 1 tablet (40 mg total) by mouth as needed for fluid or edema.   gabapentin 300 MG capsule Commonly known as: NEURONTIN TAKE 2 CAPSULES BY MOUTH IN THE MORNING AND 3 AT BEDTIME What changed:  how much to take how to take this when to take this additional instructions   hydrocortisone cream 1 % Apply 1 Application topically daily as needed for itching.   losartan-hydrochlorothiazide 100-25 MG tablet Commonly known as: HYZAAR Take 1 tablet by mouth daily. Please repeat kidney labs to get additional refills   metFORMIN 500 MG 24 hr tablet Commonly known as: GLUCOPHAGE-XR Take 1 tablet (500 mg total) by mouth 2 (two) times daily.   methocarbamol 500 MG tablet Commonly known as: ROBAXIN Take 1 tablet (500 mg total) by mouth every 8 (eight) hours as needed for muscle spasms.   metoprolol succinate 50 MG 24 hr tablet Commonly known as: TOPROL-XL TAKE 1 TABLET BY MOUTH ONCE DAILY WITH MEALS OR  IMMEDIATELY  FOLLOWING  A  MEAL   montelukast 10 MG tablet Commonly known as: SINGULAIR TAKE 1 TABLET BY MOUTH AT BEDTIME   nitrofurantoin (macrocrystal-monohydrate) 100 MG capsule Commonly known as: Macrobid Take 1 capsule (100 mg total) by mouth 2 (two) times daily for 5 days.   oxyCODONE 5 MG immediate release tablet Commonly known as: Oxy IR/ROXICODONE Take 1 tablet (5 mg total) by mouth every 4 (four) hours as needed for moderate pain (pain score 4-6).   Repatha SureClick 140 MG/ML Soaj Generic drug: Evolocumab Inject 140 mg into the skin every 14 (fourteen) days.   Semaglutide (2  MG/DOSE) 8 MG/3ML Sopn Inject 2 mg into the skin once a week.   senna-docusate 8.6-50 MG tablet Commonly known as: Senna-Time S Take 1 tablet by mouth at bedtime as needed for mild constipation.   traZODone 50 MG tablet Commonly known as: DESYREL Take 1-2 tablets (50-100 mg total) by mouth at bedtime.   Trelegy Ellipta 100-62.5-25 MCG/ACT Aepb Generic drug: Fluticasone-Umeclidin-Vilant INHALE 1 PUFF ONCE DAILY   Ventolin HFA 108 (90 Base) MCG/ACT inhaler Generic drug: albuterol Inhale 2 puffs into the lungs every 6 (six) hours as needed for wheezing or shortness of breath.        Diagnostic Studies: DG Shoulder Right Port  Result Date: 09/15/2023  CLINICAL DATA:  Status post shoulder surgery. EXAM: RIGHT SHOULDER - 1 VIEW COMPARISON:  Preoperative imaging. FINDINGS: Reverse right shoulder arthroplasty in expected alignment. No periprosthetic lucency or fracture. Recent postsurgical change includes air and edema in the joint space and soft tissues. IMPRESSION: Reverse right shoulder arthroplasty without immediate postoperative complication. Electronically Signed   By: Narda Rutherford M.D.   On: 09/15/2023 11:37   DG Bone Density  Result Date: 09/03/2023 EXAM: DUAL X-RAY ABSORPTIOMETRY (DXA) FOR BONE MINERAL DENSITY IMPRESSION: Referring Physician:  Rachel Moulds Your patient completed a bone mineral density test using GE Lunar iDXA system (analysis version: 16). Technologist:     lmn PATIENT: Name: Janney, Henly Patient ID: [id number] Birth Date: [birth year] Height: 61.8 in. Sex: Female Measured: 09/03/2023 Weight: 297.6 lbs. Indications: Aromasin, Breast Cancer History, Buspirone, Caucasian, COPD, Cymbalta, Depression, Diabetic non insulin, Estrogen Deficient, Postmenopausal, Trazodone, Secondary Osteoporosis Fractures: NONE Treatments: Vitamin D (E933.5) ASSESSMENT: The BMD measured at Femur Neck Left is 0.867 g/cm2 with a T-score of -1.2. This patient is considered osteopenic/low  bone mass according to World Health Organization [facility]) criteria. The quality of the exam is limited by patient body habitus. The lumbar spine was excluded due to degenerative changes and surgical hardware. Site Region Measured Date Measured Age YA BMD Significant CHANGE T-score DualFemur Neck Left 09/03/2023 64.0 -1.2 0.867 g/cm2 DualFemur Total Mean 09/03/2023 64.0 -0.4 0.963 g/cm2 Left Forearm Radius 33% 09/03/2023 64.0 0.4 0.908 g/cm2 World Health Organization [facility]) criteria for post-menopausal, Caucasian Women: Normal       T-score at or above -1 SD Osteopenia   T-score between -1 and -2.5 SD Osteoporosis T-score at or below -2.5 SD RECOMMENDATION: 1. All patients should optimize calcium and vitamin D intake. 2. Consider FDA-approved medical therapies in postmenopausal women and men aged 66 years and older, based on the following: a. A hip or vertebral (clinical or morphometric) fracture. b. T-score = -2.5 at the femoral neck or spine after appropriate evaluation to exclude secondary causes. c. Low bone mass (T-score between -1.0 and -2.5 at the femoral neck or spine) and a 10-year probability of a hip fracture = 3% or a 10-year probability of a major osteoporosis-related fracture = 20% based on the [place]-adapted WHO algorithm. d. Clinician judgment and/or patient preferences may indicate treatment for people with 10-year fracture probabilities above or below these levels. FOLLOW-UP: Patients with diagnosis of osteoporosis or at high risk for fracture should have regular bone mineral density tests.? Patients eligible for Medicare are allowed routine testing every 2 years.? The testing frequency can be increased to one year for patients who have rapidly progressing disease, are receiving or discontinuing medical therapy to restore bone mass, or have additional risk factors. I have reviewed this study and agree with the findings. [facility] FRAX* 10-year Probability of Fracture Based on femoral neck  BMD: DualFemur (Left) Major Osteoporotic Fracture: 6.4% Hip Fracture:                0.4% Population:                  [place] (Caucasian) Risk Factors:                Secondary Osteoporosis *FRAX is a Armed forces logistics/support/administrative officer of the [employer] of [employer] for Metabolic Bone Disease, a World Science writer (WHO) [employer]. ASSESSMENT: The probability of a major osteoporotic fracture is 6.4% within the next ten years. The probability of a hip fracture is 0.4% within  the next ten years. Electronically Signed   By: Annia Belt M.D.   On: 09/03/2023 13:37    Disposition: Discharge disposition: 01-Home or Self Care       Discharge Instructions     Call MD / Call 911   Complete by: As directed    If you experience chest pain or shortness of breath, CALL 911 and be transported to the hospital emergency room.  If you develope a fever above 101 F, pus (white drainage) or increased drainage or redness at the wound, or calf pain, call your surgeon's office.   Constipation Prevention   Complete by: As directed    Drink plenty of fluids.  Prune juice may be helpful.  You may use a stool softener, such as Colace (over the counter) 100 mg twice a day.  Use MiraLax (over the counter) for constipation as needed.   Diet - low sodium heart healthy   Complete by: As directed    Discharge instructions   Complete by: As directed    You may shower, dressing is waterproof.  Do not bathe or soak the operative shoulder in a tub, pool.  Use the CPM machine 3 times a day for one hour each time.  No lifting with the operative shoulder. Continue use of the sling.  Follow-up with Dr. August Saucer in ~2 weeks on your given appointment date.  We will remove your adhesive bandage at that time.    Dental Antibiotics:  In most cases prophylactic antibiotics for Dental procdeures after total joint surgery are not necessary.  Exceptions are as follows:  1. History of prior total joint infection  2. Severely  immunocompromised (Organ Transplant, cancer chemotherapy, Rheumatoid biologic meds such as Humera)  3. Poorly controlled diabetes (A1C &gt; 8.0, blood glucose over 200)  If you have one of these conditions, contact your surgeon for an antibiotic prescription, prior to your dental procedure.   Increase activity slowly as tolerated   Complete by: As directed    Post-operative opioid taper instructions:   Complete by: As directed    POST-OPERATIVE OPIOID TAPER INSTRUCTIONS: It is important to wean off of your opioid medication as soon as possible. If you do not need pain medication after your surgery it is ok to stop day one. Opioids include: Codeine, Hydrocodone(Norco, Vicodin), Oxycodone(Percocet, oxycontin) and hydromorphone amongst others.  Long term and even short term use of opiods can cause: Increased pain response Dependence Constipation Depression Respiratory depression And more.  Withdrawal symptoms can include Flu like symptoms Nausea, vomiting And more Techniques to manage these symptoms Hydrate well Eat regular healthy meals Stay active Use relaxation techniques(deep breathing, meditating, yoga) Do Not substitute Alcohol to help with tapering If you have been on opioids for less than two weeks and do not have pain than it is ok to stop all together.  Plan to wean off of opioids This plan should start within one week post op of your joint replacement. Maintain the same interval or time between taking each dose and first decrease the dose.  Cut the total daily intake of opioids by one tablet each day Next start to increase the time between doses. The last dose that should be eliminated is the evening dose.             SignedKarenann Cai 09/16/2023, 11:33 AM

## 2023-09-17 ENCOUNTER — Telehealth: Payer: Self-pay

## 2023-09-17 ENCOUNTER — Telehealth: Payer: Self-pay | Admitting: *Deleted

## 2023-09-17 ENCOUNTER — Telehealth: Payer: Self-pay | Admitting: Licensed Clinical Social Worker

## 2023-09-17 ENCOUNTER — Other Ambulatory Visit: Payer: Self-pay | Admitting: Student

## 2023-09-17 ENCOUNTER — Telehealth: Payer: Self-pay | Admitting: Hematology and Oncology

## 2023-09-17 DIAGNOSIS — Z9889 Other specified postprocedural states: Secondary | ICD-10-CM

## 2023-09-17 NOTE — Telephone Encounter (Signed)
Patient is aware of scheduled appointment times/dates

## 2023-09-17 NOTE — Patient Outreach (Signed)
Care Coordination  09/17/2023  [name] May 04, 1961 [id number]  [facility] LCSW returning patient and spouse's call and was able to reach them successfully today on 09/17/23. Patient reports that she is in need of temporary aide services after her recent shoulder surgery on 09/15/23 as her husband is unable to assist due to his own health complications. [facility] LCSW will sent a message to PCP and surgeon regarding this concern and will ask for a HH referral. [facility] RNCM is currently out of the office on PAL. [facility] LCSW recently signed off on patient's case but can get back involved if future needs arise.  Dickie La, BSW, MSW, [employer] Managed Medicaid LCSW [facility]  [facility] [email or web address] Phone: [phone]

## 2023-09-17 NOTE — Telephone Encounter (Signed)
Requesting PCS, please call pt back.

## 2023-09-17 NOTE — Telephone Encounter (Signed)
Spoke with patient regarding PCS paper work / [employer] folder # 3 given to [name] to complete . Patient last seen 08/18/2023. When form has been completed then I will contact patient to let her know to expect a call from medicaid in about 2-3 weeks.

## 2023-09-17 NOTE — Telephone Encounter (Signed)
Patient would like to know if she can get a HHN to come to her home and would like to know more information about the machine that was sent to her home.  Patient had right shoulder on 09/15/2023.  Cb# [phone].  Please advise.

## 2023-09-18 ENCOUNTER — Other Ambulatory Visit: Payer: Self-pay

## 2023-09-19 DIAGNOSIS — M7521 Bicipital tendinitis, right shoulder: Secondary | ICD-10-CM

## 2023-09-21 ENCOUNTER — Other Ambulatory Visit: Payer: Medicaid Other | Admitting: Obstetrics and Gynecology

## 2023-09-21 ENCOUNTER — Other Ambulatory Visit: Payer: Self-pay | Admitting: Dietician

## 2023-09-21 DIAGNOSIS — E119 Type 2 diabetes mellitus without complications: Secondary | ICD-10-CM

## 2023-09-21 MED ORDER — DEXCOM G7 SENSOR MISC
3 refills | Status: DC
Start: 2023-09-21 — End: 2024-01-29

## 2023-09-21 MED ORDER — DEXCOM G7 RECEIVER DEVI: 1.0000 [IU] | Freq: Every day | 0 refills | Status: DC

## 2023-09-21 MED ORDER — DEXCOM G7 SENSOR MISC: 1.0000 [IU] | Freq: Every day | 0 refills | Status: DC

## 2023-09-21 NOTE — Progress Notes (Unsigned)
Ordering CGM for patient's diabetes management.

## 2023-09-21 NOTE — Patient Instructions (Signed)
Hi [name], glad you are getting better every day-have a good afternoon!  [name] was given information about Medicaid Managed Care team care coordination services as a part of their [facility] Medicaid benefit. [name] verbally consented to engagement with the [facility] Managed Care team.   If you are experiencing a medical emergency, please call 911 or report to your local emergency department or urgent care.   If you have a non-emergency medical problem during routine business hours, please contact your provider's office and ask to speak with a nurse.   For questions related to your [facility] health plan, please call: [phone] or go here:[name]  If you would like to schedule transportation through your [facility] plan, please call the following number at least 2 days in advance of your appointment: [phone].   You can also use the MTM portal or MTM mobile app to manage your rides. Reimbursement for transportation is available through [name]! For the portal, please go to [email or web address]  Call the [facility] Crisis Line at [phone], at any time, 24 hours a day, 7 days a week. If you are in danger or need immediate medical attention call 911.  If you would like help to quit smoking, call 1-800-QUIT-NOW (903-558-0754) OR Espaol: 1-855-Djelo-Ya (6-301-601-0932) o para ms informacin haga clic aqu or Text READY to 355-732 to register via text  [name] - following are the goals we discussed in your visit today:   Goals Addressed    Timeframe:  Long-Range Goal Priority:  High Start Date:      04/14/22                       Expected End Date:     ongoing                  Follow Up Date: 10/20/23   - schedule appointment for flu shot - schedule appointment for vaccines needed due to my age or health - schedule recommended health tests (blood work, mammogram, colonoscopy, pap test) - schedule and keep  appointment for annual check-up    Why is this important?   Screening tests can find diseases early when they are easier to treat.  Your doctor or nurse will talk with you about which tests are important for you.  Getting shots for common diseases like the flu and shingles will help prevent them.  09/21/23: Post op appt 10/2  Patient verbalizes understanding of instructions and care plan provided today and agrees to view in MyChart. Active MyChart status and patient understanding of how to access instructions and care plan via MyChart confirmed with patient.     The Managed Medicaid care management team will [name] out to the patient again over the next 30 business  days.  The  Patient has been provided with contact information for the Managed Medicaid care management team and has been advised to call with any health related questions or concerns.   [name] Der RN, BSN [place]  [facility] Care Management Coordinator - Managed Medicaid High Risk [phone]    Following is a copy of your plan of care:  Care Plan : RN Care Manager Plan of Care  Updates made by [name] Chandler, RN since 09/21/2023 12:00 AM     Problem: Health Promotion or Disease Self-Management (General Plan of Care)   Priority: High  Onset Date: 04/14/2022     Long-Range Goal: Chronic Disease Management and Care Coordination Needs  Start Date: 04/14/2022  Expected End Date: 11/21/2023  Recent Progress: Not on track  Priority: High  Note:   Current Barriers:  Knowledge Deficits related to plan of care for management of Anxiety and Depression along with other chronic health conditions, HTN, DM, HF, asthma, COPD, OSA, arthritis, GERD, CAD, HLD, h/o breast cancer, RLS Chronic Disease Management support and education needs related to Anxiety and Depression. DM, HTN, HF, asthma, OSA, arthritis, GERD, CAD, HLD, h/o breast cancer, RLS. 09/21/23:  BP cuff received.  Recovering from right shoulder surgery-has  post op 10/2.    RNCM Clinical Goal(s):  Patient will verbalize understanding of plan for management of Anxiety and Depression, DM, HTN  as evidenced by patient report take all medications exactly as prescribed and will call provider for medication related questions as evidenced by patient report demonstrate understanding of rationale for each prescribed medication as evidenced by patient report attend all scheduled medical appointments  as evidenced by patient report continue to work with RN Care Manager to address care management and care coordination needs related to  Anxiety and Depression, DM, HTN as evidenced by adherence to CM Team Scheduled appointments work with Child psychotherapist to address  related to the management of Mental Health Concerns  related to the management of Anxiety and Depression as evidenced by review of EMR and patient or Child psychotherapist report through collaboration with Medical illustrator, provider, and care team.   Interventions: Inter-disciplinary care team collaboration (see longitudinal plan of care) Evaluation of current treatment plan related to  self management and patient's adherence to plan as established by provider   (Status:  New goal.)  Long Term Goal Evaluation of current treatment plan related to Anxiety and Depression, Mental Health Concerns  self-management and patient's adherence to plan as established by provider. Discussed plans with patient for ongoing care management follow up and provided patient with direct contact information for care management team Advised patient to contact provider for any additional DME needs, CBG, and possible general surgeon referral. Reviewed medications with patient Collaborated with LCSW regarding anxiety/depression Reviewed scheduled/upcoming provider appointments Advised patient, providing education and rationale, to check cbg and record, calling provider  for findings outside established parameters  Social Work referral for  anxiety/depression Discussed plans with patient for ongoing care management follow up and provided patient with direct contact information for care management team Assessed social determinant of health barriers Collaborated with Lubertha Sayres Managed Medicaid Liaison regarding DME-motorized wheelchair and lift chair. LCSW appointment rescheduled. Collaborated with Pharmacist. Pharmacy referral for polypharmacy at patient's request-completed  Asthma: (Status:New goal.) Long Term Goal Advised patient to track and manage Asthma triggers Advised patient to engage in light exercise as tolerated 3-5 days a week to aid in the the management of Asthma Provided education about and advised patient to utilize infection prevention strategies to reduce risk of respiratory infection Discussed the importance of adequate rest and management of fatigue with Asthma Assessed social determinant of health barriers   CAD Interventions: (Status:  New goal.) Long Term Goal Assessed understanding of CAD diagnosis Medications reviewed including medications utilized in CAD treatment plan Provided education on importance of blood pressure control in management of CAD Provided education on Importance of limiting foods high in cholesterol Counseled on importance of regular laboratory monitoring as prescribed Counseled on the importance of exercise goals with target of 150 minutes per week Reviewed Importance of taking all medications as prescribed Reviewed Importance of attending all scheduled provider appointments Advised to report any  changes in symptoms or exercise tolerance Assessed social determinant of health barriers  Heart Failure Interventions:  (Status:  New goal.) Long Term Goal Basic overview and discussion of pathophysiology of Heart Failure reviewed Provided education on low sodium diet Assessed need for readable accurate scales in home Provided education about placing scale on hard, flat  surface Advised patient to weigh each morning after emptying bladder Discussed importance of daily weight and advised patient to weigh and record daily Discussed the importance of keeping all appointments with provider Provided patient with education about the role of exercise in the management of heart failure Assessed social determinant of health barriers   COPD Interventions:  (Status:  New goal.) Long Term Goal Advised patient to track and manage COPD triggers Advised patient to self assesses COPD action plan zone and make appointment with provider if in the yellow zone for 48 hours without improvement Advised patient to engage in light exercise as tolerated 3-5 days a week to aid in the the management of COPD Provided education about and advised patient to utilize infection prevention strategies to reduce risk of respiratory infection Discussed the importance of adequate rest and management of fatigue with COPD Assessed social determinant of health barriers  Hyperlipidemia Interventions:  (Status:  New goal.) Long Term Goal Medication review performed; medication list updated in electronic medical record.  Provider established cholesterol goals reviewed Counseled on importance of regular laboratory monitoring as prescribed Reviewed importance of limiting foods high in cholesterol Reviewed exercise goals and target of 150 minutes per week Assessed social determinant of health barriers    Diabetes Interventions:  (Status:  New goal.) Long Term Goal Assessed patient's understanding of A1c goal: <7% Reviewed medications with patient and discussed importance of medication adherence Reviewed scheduled/upcoming provider appointments  Advised patient, providing education and rationale, to check cbg and record, calling provider for findings outside established parameters Review of patient status, including review of consultants reports, relevant laboratory and other test results, and  medications completed Assessed social determinant of health barriers Lab Results  Component Value Date   HGBA1C 7.1 (A) 08/27/2022   HGB A1C  7.3 on 06/03/23 HGBA1C   7.0 on 08/18/23 HGBA1C  7.1 on 09/09/23  Hypertension Interventions:  (Status:  New goal.) Long Term Goal Last practice recorded BP readings:  BP Readings from Last 3 Encounters:        08/18/23         153/74 03/18/23         130/50 06/03/23         127/59  Most recent eGFR/CrCl:  Lab Results  Component Value Date   EGFR 90 06/04/2022    No components found for: "CRCL"  Evaluation of current treatment plan related to hypertension self management and patient's adherence to plan as established by provider Reviewed medications with patient and discussed importance of compliance Discussed plans with patient for ongoing care management follow up and provided patient with direct contact information for care management team Advised patient, providing education and rationale, to monitor blood pressure daily and record, calling PCP for findings outside established parameters Reviewed scheduled/upcoming provider appointments including:  Assessed social determinant of health barriers   Patient Goals/Self-Care Activities: Take all medications as prescribed Attend all scheduled provider appointments Call pharmacy for medication refills 3-7 days in advance of running out of medications Perform all self care activities independently  Perform IADL's (shopping, preparing meals, housekeeping, managing finances) independently Call provider office for new concerns or questions  Work  with the social worker to address care coordination needs and will continue to work with the clinical team to address health care and disease management related needs Patient to f/u with provider regarding sciatica pain-completed  Follow Up Plan:  The patient has been provided with contact information for the care management team and has been advised to  call with any health related questions or concerns.  The care management team will [name] out to the patient again over the next 60 business  days.

## 2023-09-21 NOTE — Patient Outreach (Signed)
Medicaid Managed Care   Nurse Care Manager Note  09/21/2023 Name:  [name] MRN:  [id number] DOB:  1961-09-08  [name] is an 62 y.o. year old female who is a primary patient of Philomena Doheny, MD.  The Medicaid Managed Care Coordination team was consulted for assistance with:    Chronic healthcare management needs, GERD, OSA, HF, CAD, HLD, RLS, COPD, asthma, h/o breast cnacer, sciatica, HTN, DM, neuropathy, anxiety/depression/PTSD  [name] was given information about Medicaid Managed Care Coordination team services today. [name] Patient agreed to services and verbal consent obtained.  Engaged with patient by telephone for follow up visit in response to provider referral for case management and/or care coordination services.   Assessments/Interventions:  Review of past medical history, allergies, medications, health status, including review of consultants reports, laboratory and other test data, was performed as part of comprehensive evaluation and provision of chronic care management services.  SDOH (Social Determinants of Health) assessments and interventions performed: SDOH Interventions    Flowsheet Row Patient Outreach Telephone from 09/21/2023 in [place] POPULATION HEALTH DEPARTMENT Patient Outreach Telephone from 09/07/2023 in [place] POPULATION HEALTH DEPARTMENT Patient Outreach Telephone from 08/21/2023 in [place] POPULATION HEALTH DEPARTMENT Patient Outreach Telephone from 07/21/2023 in [place] POPULATION HEALTH DEPARTMENT Patient Outreach Telephone from 07/07/2023 in [place] POPULATION HEALTH DEPARTMENT Patient Outreach Telephone from 06/16/2023 in [place] POPULATION HEALTH DEPARTMENT  SDOH Interventions        Food Insecurity Interventions -- -- -- -- -- Intervention Not Indicated  Housing Interventions -- -- -- -- -- Intervention Not Indicated  Transportation Interventions Intervention Not Indicated -- -- -- -- --   Utilities Interventions Intervention Not Indicated -- -- -- -- --  Alcohol Usage Interventions -- -- Intervention Not Indicated (Score <7) -- -- --  Physical Activity Interventions -- -- -- Other (Comments)  [not able to perform exercise at this level] -- --  Stress Interventions -- [employer], Provide Counseling -- -- Provide Counseling, 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 Sig Documenting Provider Last Dose Status Informant  acetaminophen (TYLENOL) 500 MG tablet [id number] No Take 1,000 mg by mouth every 6 (six) hours as needed for moderate pain. [provider] 09/14/2023 2100 Active Self  amLODipine (NORVASC) 5 MG tablet [id number] No Take 1 tablet (5 mg total) by mouth daily. Evlyn Kanner, MD 09/15/2023 0315 Active Self  aspirin EC 81 MG tablet [id number]  Take 1 tablet (81 mg total) by mouth daily. Swallow whole. Magnant, Joycie Peek, PA-C  Active   atorvastatin (LIPITOR) 80 MG tablet [id number] No Take 1 tablet (80 mg total) by mouth daily. Evlyn Kanner, MD 09/14/2023 Active Self  Blood Pressure Monitoring (BLOOD PRESSURE CUFF) MISC [id number]  Please follow insert instructions. Philomena Doheny, MD  Active Self  busPIRone (BUSPAR) 15 MG tablet [id number] No Take 1 tablet (15 mg total) by mouth 2 (two) times daily. Shanna Cisco, NP 09/15/2023 0315 Active Self  cholecalciferol (VITAMIN D3) 25 MCG (1000 UNIT) tablet [id number] No Take 1,000 Units by mouth daily. [provider] Past Week Active Self  clopidogrel (PLAVIX) 75 MG tablet  [id number] No TAKE 1 TABLET BY MOUTH IN THE Wylie Hail, MD 09/08/2023 Active  Self  Continuous Glucose Receiver (DEXCOM G7 RECEIVER) DEVI [id number]  1 Units by Does not apply route daily. Faith Rogue, DO  Active   Continuous Glucose Sensor (DEXCOM G7 SENSOR) MISC [id number]  Place new sensor every 10 days. Use to monitor blood sugar continuously. Modena Slater, DO  Active   Continuous Glucose Sensor (DEXCOM G7 SENSOR) MISC [id number]  1 Units by Does not apply route daily. Faith Rogue, DO  Active   Continuous Glucose Sensor (FREESTYLE LIBRE 3 SENSOR) [place] [id number]  Place 1 sensor on the skin every 14 days. Use to check glucose continuously Reymundo Poll, MD  Active Self  DULoxetine (CYMBALTA) 20 MG capsule [id number] No Take 1 capsule (20 mg total) by mouth daily. Shanna Cisco, NP 09/15/2023 0315 Active Self  Evolocumab (REPATHA SURECLICK) 140 MG/ML SOAJ [id number] No Inject 140 mg into the skin every 14 (fourteen) days. Reymundo Poll, MD Past Month Active Self  exemestane (AROMASIN) 25 MG tablet [id number] No TAKE 1 TABLET BY MOUTH ONCE DAILY AFTER Philippa Sicks, MD 09/15/2023 0315 Active Self  ezetimibe (ZETIA) 10 MG tablet [id number] No Take 1 tablet by mouth once daily  Patient taking differently: Take 10 mg by mouth at bedtime.   Levi Aland, NP 09/14/2023 Active Self  Discontinued 05/09/20 1429   Fluticasone-Umeclidin-Vilant (TRELEGY ELLIPTA) 100-62.5-25 MCG/ACT AEPB [id number] No INHALE 1 PUFF ONCE DAILY Faith Rogue, DO 09/15/2023 0315 Active Self  furosemide (LASIX) 40 MG tablet [id number] No Take 1 tablet (40 mg total) by mouth as needed for fluid or edema. Chauncey Mann, DO More than a month Active Self  gabapentin (NEURONTIN) 300 MG capsule [id number] No TAKE 2 CAPSULES BY MOUTH IN THE MORNING AND 3 AT BEDTIME  Patient taking differently: Take 300-900 mg by mouth See admin instructions. TAKE 300 mg BY MOUTH IN THE MORNING AND 900 mg AT BEDTIME   Steffanie Rainwater, MD 09/15/2023 0315 Active Self           Med Note Gunnar Fusi, MELISSA R    Thu Aug 27, 2023  1:05 PM)    hydrocortisone cream 1 % [id number] No Apply 1 Application topically daily as needed for itching. [provider] Unknown Active Self  losartan-hydrochlorothiazide (HYZAAR) 100-25 MG tablet [id number]  Take 1 tablet by mouth daily. Please repeat kidney labs to get additional refills Rana Snare, DO  Active   metFORMIN (GLUCOPHAGE-XR) 500 MG 24 hr tablet [id number] No Take 1 tablet (500 mg total) by mouth 2 (two) times daily. Reymundo Poll, MD 09/14/2023 Active Self  methocarbamol (ROBAXIN) 500 MG tablet [id number]  Take 1 tablet (500 mg total) by mouth every 8 (eight) hours as needed for muscle spasms. Magnant, Joycie Peek, PA-C  Active   metoprolol succinate (TOPROL-XL) 50 MG 24 hr tablet [id number] No TAKE 1 TABLET BY MOUTH ONCE DAILY WITH MEALS OR  IMMEDIATELY  FOLLOWING  A  MEAL Evlyn Kanner, MD 09/15/2023 0315 Active Self  montelukast (SINGULAIR) 10 MG tablet [id number] No TAKE 1 TABLET BY MOUTH AT BEDTIME Steffanie Rainwater, MD 09/14/2023 Active Self  oxyCODONE (OXY IR/ROXICODONE) 5 MG immediate release tablet [id number]  Take 1 tablet (5 mg total) by mouth every 4 (four) hours as needed for moderate pain (pain score 4-6). Magnant, Joycie Peek, PA-C  Active   Semaglutide, 2 MG/DOSE, 8 MG/3ML SOPN [id number] No Inject 2 mg into the skin once a week. Reymundo Poll,  MD 09/05/2023 Active Self           Med Note Gunnar Fusi, MELISSA R   Thu Aug 27, 2023  1:00 PM) Ozempic   senna-docusate (SENNA-TIME S) 8.6-50 MG tablet [id number] No Take 1 tablet by mouth at bedtime as needed for mild constipation. Merrilyn Puma, MD More than a month Active Self           Med Note Gunnar Fusi, MELISSA R   Thu Aug 27, 2023  1:05 PM) ONLY AS NEEDED   traZODone (DESYREL) 50 MG tablet [id number] No Take 1-2 tablets (50-100 mg total) by mouth at bedtime. [name] E, NP 09/14/2023 Active Self  VENTOLIN HFA 108 (90 Base) MCG/ACT inhaler [id number] No Inhale 2 puffs into the lungs every  6 (six) hours as needed for wheezing or shortness of breath. Faith Rogue, DO 09/15/2023 0315 Active Self           Patient Active Problem List   Diagnosis Date Noted   Biceps tendonitis on right 09/19/2023   OA (osteoarthritis) of shoulder 09/15/2023   S/P reverse total shoulder arthroplasty, right 09/15/2023   Chronic constipation 08/15/2023   History of iron deficiency 08/15/2023   Antiplatelet or antithrombotic long-term use 08/15/2023   Abdominal hernia 03/19/2023   Diabetic neuropathy (HCC) 08/29/2022   Vaginal lesion 08/29/2022   PTSD (post-traumatic stress disorder) 06/24/2022   GAD (generalized anxiety disorder) 06/24/2022   Major depressive disorder, recurrent episode, moderate (HCC) 06/24/2022   Urinary incontinence 06/20/2022   Intertrigo 06/20/2022   Arthritis of left shoulder region    S/P reverse total shoulder arthroplasty, 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   Healthcare maintenance 11/17/2021   Spondylolisthesis, lumbar region 02/11/2021    Class: Acute   Malignant neoplasm of upper-outer quadrant of right breast in female, estrogen receptor positive (HCC) 06/10/2018   Restless leg syndrome 02/04/2018   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   Depression 08/06/2015   Morbid obesity (HCC)    Hypertension associated with diabetes (HCC)    Aortic stenosis s/p TAVR    Chronic diastolic congestive heart failure (HCC)    COPD (chronic obstructive pulmonary disease) (HCC) 09/21/2013   Obstructive sleep apnea 09/21/2013   Nephrolithiasis 09/21/2013   Nocturnal hypoxemia 09/12/2013   Hyperlipidemia 11/30/2012   History of pulmonary embolus (PE) 07/09/2012   Normocytic anemia 02/08/2012   Type 2 diabetes mellitus without complication, without long-term current use of insulin (HCC) 02/08/2012   Asthma 02/08/2012   GERD (gastroesophageal reflux disease)    CAD (coronary artery disease) 12/08/2011   Conditions to be addressed/monitored per PCP order:  Chronic healthcare  management needs, GERD, OSA, HF, CAD, HLD, RLS, COPD, asthma, h/o breast cnacer, sciatica, HTN, DM, neuropathy, anxiety/depression/PTSD  Care Plan : RN Care Manager Plan of Care  Updates made by [name], RN since 09/21/2023 12:00 AM     Problem: Health Promotion or Disease Self-Management (General Plan of Care)   Priority: High  Onset Date: 04/14/2022     Long-Range Goal: Chronic Disease Management and Care Coordination Needs   Start Date: 04/14/2022  Expected End Date: 11/21/2023  Recent Progress: Not on track  Priority: High  Note:   Current Barriers:  Knowledge Deficits related to plan of care for management of Anxiety and Depression along with other chronic health conditions, HTN, DM, HF, asthma, COPD, OSA, arthritis, GERD, CAD, HLD, h/o breast cancer, RLS Chronic Disease Management support and education needs related to Anxiety and Depression. DM, HTN, HF, asthma, OSA, arthritis, GERD, CAD, HLD,  h/o breast cancer, RLS. 09/21/23:  BP cuff received.  Recovering from right shoulder surgery-has post op 10/2.    RNCM Clinical Goal(s):  Patient will verbalize understanding of plan for management of Anxiety and Depression, DM, HTN  as evidenced by patient report take all medications exactly as prescribed and will call provider for medication related questions as evidenced by patient report demonstrate understanding of rationale for each prescribed medication as evidenced by patient report attend all scheduled medical appointments  as evidenced by patient report continue to work with RN Care Manager to address care management and care coordination needs related to  Anxiety and Depression, DM, HTN as evidenced by adherence to CM Team Scheduled appointments work with Child psychotherapist to address  related to the management of Mental Health Concerns  related to the management of Anxiety and Depression as evidenced by review of EMR and patient or Child psychotherapist report through collaboration with Research officer, trade union, provider, and care team.   Interventions: Inter-disciplinary care team collaboration (see longitudinal plan of care) Evaluation of current treatment plan related to  self management and patient's adherence to plan as established by provider   (Status:  New goal.)  Long Term Goal Evaluation of current treatment plan related to Anxiety and Depression, Mental Health Concerns  self-management and patient's adherence to plan as established by provider. Discussed plans with patient for ongoing care management follow up and provided patient with direct contact information for care management team Advised patient to contact provider for any additional DME needs, CBG, and possible general surgeon referral. Reviewed medications with patient Collaborated with LCSW regarding anxiety/depression Reviewed scheduled/upcoming provider appointments Advised patient, providing education and rationale, to check cbg and record, calling provider  for findings outside established parameters  Social Work referral for anxiety/depression Discussed plans with patient for ongoing care management follow up and provided patient with direct contact information for care management team Assessed social determinant of health barriers Collaborated with Lubertha Sayres Managed Medicaid Liaison regarding DME-motorized wheelchair and lift chair. LCSW appointment rescheduled. Collaborated with Pharmacist. Pharmacy referral for polypharmacy at patient's request-completed  Asthma: (Status:New goal.) Long Term Goal Advised patient to track and manage Asthma triggers Advised patient to engage in light exercise as tolerated 3-5 days a week to aid in the the management of Asthma Provided education about and advised patient to utilize infection prevention strategies to reduce risk of respiratory infection Discussed the importance of adequate rest and management of fatigue with Asthma Assessed social determinant of  health barriers   CAD Interventions: (Status:  New goal.) Long Term Goal Assessed understanding of CAD diagnosis Medications reviewed including medications utilized in CAD treatment plan Provided education on importance of blood pressure control in management of CAD Provided education on Importance of limiting foods high in cholesterol Counseled on importance of regular laboratory monitoring as prescribed Counseled on the importance of exercise goals with target of 150 minutes per week Reviewed Importance of taking all medications as prescribed Reviewed Importance of attending all scheduled provider appointments Advised to report any changes in symptoms or exercise tolerance Assessed social determinant of health barriers  Heart Failure Interventions:  (Status:  New goal.) Long Term Goal Basic overview and discussion of pathophysiology of Heart Failure reviewed Provided education on low sodium diet Assessed need for readable accurate scales in home Provided education about placing scale on hard, flat surface Advised patient to weigh each morning after emptying bladder Discussed importance of daily weight and advised patient to weigh and record  daily Discussed the importance of keeping all appointments with provider Provided patient with education about the role of exercise in the management of heart failure Assessed social determinant of health barriers   COPD Interventions:  (Status:  New goal.) Long Term Goal Advised patient to track and manage COPD triggers Advised patient to self assesses COPD action plan zone and make appointment with provider if in the yellow zone for 48 hours without improvement Advised patient to engage in light exercise as tolerated 3-5 days a week to aid in the the management of COPD Provided education about and advised patient to utilize infection prevention strategies to reduce risk of respiratory infection Discussed the importance of adequate rest and  management of fatigue with COPD Assessed social determinant of health barriers  Hyperlipidemia Interventions:  (Status:  New goal.) Long Term Goal Medication review performed; medication list updated in electronic medical record.  Provider established cholesterol goals reviewed Counseled on importance of regular laboratory monitoring as prescribed Reviewed importance of limiting foods high in cholesterol Reviewed exercise goals and target of 150 minutes per week Assessed social determinant of health barriers    Diabetes Interventions:  (Status:  New goal.) Long Term Goal Assessed patient's understanding of A1c goal: <7% Reviewed medications with patient and discussed importance of medication adherence Reviewed scheduled/upcoming provider appointments  Advised patient, providing education and rationale, to check cbg and record, calling provider for findings outside established parameters Review of patient status, including review of consultants reports, relevant laboratory and other test results, and medications completed Assessed social determinant of health barriers Lab Results  Component Value Date   HGBA1C 7.1 (A) 08/27/2022   HGB A1C  7.3 on 06/03/23 HGBA1C   7.0 on 08/18/23 HGBA1C  7.1 on 09/09/23  Hypertension Interventions:  (Status:  New goal.) Long Term Goal Last practice recorded BP readings:  BP Readings from Last 3 Encounters:        08/18/23         153/74 03/18/23         130/50 06/03/23         127/59  Most recent eGFR/CrCl:  Lab Results  Component Value Date   EGFR 90 06/04/2022    No components found for: "CRCL"  Evaluation of current treatment plan related to hypertension self management and patient's adherence to plan as established by provider Reviewed medications with patient and discussed importance of compliance Discussed plans with patient for ongoing care management follow up and provided patient with direct contact information for care management  team Advised patient, providing education and rationale, to monitor blood pressure daily and record, calling PCP for findings outside established parameters Reviewed scheduled/upcoming provider appointments including:  Assessed social determinant of health barriers   Patient Goals/Self-Care Activities: Take all medications as prescribed Attend all scheduled provider appointments Call pharmacy for medication refills 3-7 days in advance of running out of medications Perform all self care activities independently  Perform IADL's (shopping, preparing meals, housekeeping, managing finances) independently Call provider office for new concerns or questions  Work with the social worker to address care coordination needs and will continue to work with the clinical team to address health care and disease management related needs Patient to f/u with provider regarding sciatica pain-completed  Follow Up Plan:  The patient has been provided with contact information for the care management team and has been advised to call with any health related questions or concerns.  The care management team will reach out to the patient again  over the next 60 business  days.    Long-Range Goal: Establish Plan of Care for Chronic Disease Management Needs   Priority: High  Note:   Timeframe:  Long-Range Goal Priority:  High Start Date:      04/14/22                       Expected End Date:     ongoing                  Follow Up Date: 10/20/23   - schedule appointment for flu shot - schedule appointment for vaccines needed due to my age or health - schedule recommended health tests (blood work, mammogram, colonoscopy, pap test) - schedule and keep appointment for annual check-up    Why is this important?   Screening tests can find diseases early when they are easier to treat.  Your doctor or nurse will talk with you about which tests are important for you.  Getting shots for common diseases like the flu and  shingles will help prevent them.  09/21/23: Post op appt 10/2   Follow Up:  Patient agrees to Care Plan and Follow-up.  Plan: The Managed Medicaid care management team will reach out to the patient again over the next 30 business  days. and The  Patient has been provided with contact information for the Managed Medicaid care management team and has been advised to call with any health related questions or concerns.  Date/time of next scheduled RN care management/care coordination outreach:  10/20/23

## 2023-09-21 NOTE — Telephone Encounter (Signed)
Patient calls and says she cannot get libre 3- It's on back order. She would lie [place] to order a Dexcom G7 to [address] . I asked if she qualified by being on insulin and she said no. She says she cannot prick her finger. I explained she could use the A1c test to self monitor her diabetes care if the CGM is not covered. I congratulated her as she was able to state her recent A1c.    Lab Results  Component Value Date   HGBA1C 7.1 (H) 09/09/2023   HGBA1C 7.0 (A) 08/18/2023   HGBA1C 7.3 (A) 06/03/2023   HGBA1C 6.6 (H) 01/08/2023   HGBA1C 7.1 (A) 08/27/2022

## 2023-09-22 ENCOUNTER — Other Ambulatory Visit: Payer: Medicaid Other | Admitting: Pharmacist

## 2023-09-22 ENCOUNTER — Telehealth: Payer: Self-pay | Admitting: *Deleted

## 2023-09-22 ENCOUNTER — Telehealth (INDEPENDENT_AMBULATORY_CARE_PROVIDER_SITE_OTHER): Payer: Medicaid Other | Admitting: Psychiatry

## 2023-09-22 ENCOUNTER — Encounter (HOSPITAL_COMMUNITY): Payer: Self-pay | Admitting: Psychiatry

## 2023-09-22 DIAGNOSIS — F411 Generalized anxiety disorder: Secondary | ICD-10-CM

## 2023-09-22 DIAGNOSIS — F32A Depression, unspecified: Secondary | ICD-10-CM

## 2023-09-22 DIAGNOSIS — F4310 Post-traumatic stress disorder, unspecified: Secondary | ICD-10-CM

## 2023-09-22 MED ORDER — BUSPIRONE HCL 15 MG PO TABS
15.0000 mg | ORAL_TABLET | Freq: Two times a day (BID) | ORAL | 3 refills | Status: DC
Start: 2023-09-22 — End: 2023-12-15

## 2023-09-22 MED ORDER — TRAZODONE HCL 50 MG PO TABS: 50.0000 mg | ORAL_TABLET | Freq: Every day | ORAL | 3 refills | Status: DC

## 2023-09-22 MED ORDER — DULOXETINE HCL 20 MG PO CPEP
20.0000 mg | ORAL_CAPSULE | Freq: Every day | ORAL | 3 refills | Status: DC
Start: 2023-09-22 — End: 2023-12-15

## 2023-09-22 NOTE — Telephone Encounter (Signed)
Paper work for [employer] faxed to [employer] / Geophysicist/field seismologist for review-schecduling. [phone] / fax [phone]. Patent is aware of this and to Medicaid should be contacting her in about 2-3 weeks to set up an appointment.

## 2023-09-22 NOTE — Progress Notes (Unsigned)
09/22/2023 Name: JANNICE FAIVRE MRN: [id number] DOB: 1961-11-16  Chief Complaint  Patient presents with   Diabetes   Hypertension    SHEKELA VAYNER is a 62 y.o. year old female who presented for a telephone visit.   They were referred to the pharmacist by their PCP for assistance in managing diabetes, hypertension, and hyperlipidemia.    Subjective:  Care Team: Primary Care Provider: Philomena Doheny, MD ; Next Scheduled Visit:   Medication Access/Adherence  Current Pharmacy:  [facility] [address], [place] - [address] [place] [address] Phone: [phone] Fax: [phone]  [facility] - [place], [place] - [address] [place] [address] Phone: [phone] Fax: [phone]   Patient reports affordability concerns with their medications: No  Patient reports access/transportation concerns to their pharmacy: No  Patient reports adherence concerns with their medications:  No     Diabetes:  Current medications: metformin XR 500 mg twice daily, Ozempic 2 mg weekly  Medications tried in the past: tried [place], but reports she had dizziness/lightheadedness. Had not checked her blood pressure; does not appear she has been on a higher dose of metformin XR before.  Patient 294lb, losing weight with ozempic at 2mg  dosing and is pleased.   BG 146 during our call today. New to [facility] G7, due to product availability, switched from libre 3. BG 135-140s fasting, as low as 90.  BG 250s postprandial.  Patient denies hypoglycemic s/sx including dizziness, shakiness, sweating. Patient denies hyperglycemic symptoms including polyuria, polydipsia, polyphagia, nocturia, neuropathy, blurred vision.  Hypertension:  Current medications: amlodipine 5 mg daily, losartan/hydrochlorothiazide 100/25 mg daily, metoprolol succinate 50 mg daily  Patient has a validated  home BP cuff Current blood pressure readings readings: unable to check right now due to recent shoulder surgery.  Patient reports hypotensive s/sx including dizziness, lightheadedness. In retrospect, wonders if her symptoms she reported with Marcelline Deist could be related to hypotension   Hyperlipidemia/ASCVD Risk Reduction  Current lipid lowering medications: atorvastatin 80 mg, ezetimibe 10 mg daily, Repatha 140 mg every 2 weeks  Antiplatelet regimen: clopidogrel 75 mg daily  Objective:  Lab Results  Component Value Date   HGBA1C 7.1 (H) 09/09/2023    Lab Results  Component Value Date   CREATININE 0.83 09/14/2023   BUN 11 09/14/2023   NA 139 09/14/2023   K 3.8 09/14/2023   CL 101 09/14/2023   CO2 29 09/14/2023    Lab Results  Component Value Date   CHOL 165 12/25/2022   HDL 49 12/25/2022   LDLCALC 101 (H) 12/25/2022   LDLDIRECT 162.0 10/17/2013   TRIG 78 12/25/2022   CHOLHDL 3.4 12/25/2022    Medications Reviewed Today     Reviewed by Gabriel Carina, RPH (Pharmacist) on 09/22/23 at 1321  Med List Status: <None>   Medication Order Taking? Sig Documenting Provider Last Dose Status Informant  acetaminophen (TYLENOL) 500 MG tablet [id number] Yes Take 1,000 mg by mouth every 6 (six) hours as needed for moderate pain. [provider] Taking Active Self  amLODipine (NORVASC) 5 MG tablet [id number] Yes Take 1 tablet (5 mg total) by mouth daily. Evlyn Kanner, MD Taking Active Self  aspirin EC 81 MG tablet [id number] No Take 1 tablet (81 mg total) by mouth daily. Swallow whole.  Patient not taking: Reported on 09/22/2023   Domingo Dimes Not Taking Active   atorvastatin (LIPITOR) 80 MG tablet [id number] Yes  Take 1 tablet (80 mg total) by mouth daily. Evlyn Kanner, MD Taking Active Self  Blood Pressure Monitoring (BLOOD PRESSURE CUFF) MISC [id number] Yes Please follow insert instructions. Philomena Doheny, MD Taking Active Self  busPIRone (BUSPAR)  15 MG tablet [id number] Yes Take 1 tablet (15 mg total) by mouth 2 (two) times daily. Shanna Cisco, NP Taking Active   cholecalciferol (VITAMIN D3) 25 MCG (1000 UNIT) tablet [id number] Yes Take 1,000 Units by mouth daily. [provider] Taking Active Self  clopidogrel (PLAVIX) 75 MG tablet [id number] Yes TAKE 1 TABLET BY MOUTH IN THE Wylie Hail, MD Taking Active Self  Continuous Glucose Receiver [facility] G7 RECEIVER) DEVI [id number] Yes 1 Units by Does not apply route daily. Faith Rogue, DO Taking Active   Continuous Glucose Sensor (DEXCOM G7 SENSOR) MISC [id number] Yes Place new sensor every 10 days. Use to monitor blood sugar continuously. Modena Slater, DO Taking Active   Continuous Glucose Sensor (DEXCOM G7 SENSOR) MISC [id number] Yes 1 Units by Does not apply route daily. Faith Rogue, DO Taking Active   Continuous Glucose Sensor (FREESTYLE LIBRE 3 SENSOR) [place] [id number] No Place 1 sensor on the skin every 14 days. Use to check glucose continuously  Patient not taking: Reported on 09/22/2023   Reymundo Poll, MD Not Taking Active Self  DULoxetine (CYMBALTA) 20 MG capsule [id number] Yes Take 1 capsule (20 mg total) by mouth daily. Shanna Cisco, NP Taking Active   Evolocumab [facility] SURECLICK) 140 MG/ML Ivory Broad [id number] Yes Inject 140 mg into the skin every 14 (fourteen) days. Reymundo Poll, MD Taking Active Self  exemestane (AROMASIN) 25 MG tablet [id number] Yes TAKE 1 TABLET BY MOUTH ONCE DAILY AFTER Philippa Sicks, MD Taking Active Self  ezetimibe (ZETIA) 10 MG tablet [id number] Yes Take 1 tablet by mouth once daily  Patient taking differently: Take 10 mg by mouth at bedtime.   Swinyer, Zachary George, NP Taking Active Self    Discontinued 05/09/20 1429   Fluticasone-Umeclidin-Vilant (TRELEGY ELLIPTA) 100-62.5-25 MCG/ACT AEPB [id number] Yes INHALE 1 PUFF ONCE DAILY Faith Rogue, DO Taking Active Self  furosemide (LASIX) 40 MG tablet [id number] Yes Take  1 tablet (40 mg total) by mouth as needed for fluid or edema. Chauncey Mann, DO Taking Active Self  gabapentin (NEURONTIN) 300 MG capsule [id number] Yes TAKE 2 CAPSULES BY MOUTH IN THE MORNING AND 3 AT BEDTIME  Patient taking differently: Take 300-900 mg by mouth See admin instructions. TAKE 300 mg BY MOUTH IN THE MORNING AND 900 mg AT BEDTIME   Steffanie Rainwater, MD Taking Active Self           Med Note Gunnar Fusi, MELISSA R   Thu Aug 27, 2023  1:05 PM)    hydrocortisone cream 1 % [id number] Yes Apply 1 Application topically daily as needed for itching. [provider] Taking Active Self  losartan-hydrochlorothiazide (HYZAAR) 100-25 MG tablet [id number] Yes Take 1 tablet by mouth daily. Please repeat kidney labs to get additional refills Rana Snare, DO Taking Active   metFORMIN (GLUCOPHAGE-XR) 500 MG 24 hr tablet [id number] Yes Take 1 tablet (500 mg total) by mouth 2 (two) times daily. Reymundo Poll, MD Taking Active Self  methocarbamol (ROBAXIN) 500 MG tablet [id number] Yes Take 1 tablet (500 mg total) by mouth every 8 (eight) hours as needed for muscle spasms. Magnant, Joycie Peek, PA-C Taking Active   metoprolol succinate (TOPROL-XL) 50 MG 24 hr tablet [id number] Yes TAKE 1 TABLET BY MOUTH ONCE DAILY WITH MEALS  OR  IMMEDIATELY  FOLLOWING  A  MEAL Evlyn Kanner, MD Taking Active Self  montelukast (SINGULAIR) 10 MG tablet [id number] Yes TAKE 1 TABLET BY MOUTH AT BEDTIME Steffanie Rainwater, MD Taking Active Self  oxyCODONE (OXY IR/ROXICODONE) 5 MG immediate release tablet [id number] Yes Take 1 tablet (5 mg total) by mouth every 4 (four) hours as needed for moderate pain (pain score 4-6). Magnant, Joycie Peek, PA-C Taking Active   Semaglutide, 2 MG/DOSE, 8 MG/3ML SOPN [id number] Yes Inject 2 mg into the skin once a week. Reymundo Poll, MD Taking Active Self           Med Note Karsten Fells Sep 22, 2023  1:20 PM) Taking Saturdays as of 09/22/23 review.   senna-docusate  (SENNA-TIME S) 8.6-50 MG tablet [id number] Yes Take 1 tablet by mouth at bedtime as needed for mild constipation. Merrilyn Puma, MD Taking Active Self           Med Note Gunnar Fusi, MELISSA R   Thu Aug 27, 2023  1:05 PM) ONLY AS NEEDED   traZODone (DESYREL) 50 MG tablet [id number] Yes Take 1-2 tablets (50-100 mg total) by mouth at bedtime. Shanna Cisco, NP Taking Active   VENTOLIN HFA 108 (90 Base) MCG/ACT inhaler [id number] Yes Inhale 2 puffs into the lungs every 6 (six) hours as needed for wheezing or shortness of breath. Faith Rogue, DO Taking Active Self              Assessment/Plan:   Diabetes: - Currently uncontrolled, but very close to goal, continuing to work on lifestyle modifications. - Reviewed long term cardiovascular and renal outcomes of uncontrolled blood sugar - Reviewed goal A1c, goal fasting, and goal 2 hour post prandial glucose - Recommend continue current regimen at this time - Recommend to check glucose continuously using CGM  Hypertension: - Currently controlled - Reviewed long term cardiovascular and renal outcomes of uncontrolled blood pressure - Reviewed appropriate blood pressure monitoring technique and reviewed goal blood pressure. Recommended to check home blood pressure and heart rate periodically. Patient successfully received BP cuff using [facility]. Unable to check pressures at this time as she is recovering from shoulder surgery, will check when able. - Recommend to continue current regimen at this time   Hyperlipidemia/ASCVD Risk Reduction: - Currently unknown control, due for lipid recheck.  - Recommend to continue current regimen at this time   Follow Up Plan: no future follow up scheduled, providing patient with phone number if desiring future pharmacist support.  Lynnda Shields, PharmD, BCPS Clinical Pharmacist [facility]
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
## 2023-09-23 NOTE — Patient Instructions (Signed)
[name],  Thank you for speaking with me today. Continue to work on nutrition and exercise as able, to help with blood sugar and blood pressure control.  If you need any future pharmacist support, you can call [phone] and request a phone appointment with a pharmacist.  Take care, [name], PharmD, BCPS Clinical Pharmacist [facility]

## 2023-09-26 DIAGNOSIS — M7521 Bicipital tendinitis, right shoulder: Secondary | ICD-10-CM | POA: Diagnosis not present

## 2023-09-26 DIAGNOSIS — D509 Iron deficiency anemia, unspecified: Secondary | ICD-10-CM | POA: Diagnosis not present

## 2023-09-26 DIAGNOSIS — I110 Hypertensive heart disease with heart failure: Secondary | ICD-10-CM | POA: Diagnosis not present

## 2023-09-26 DIAGNOSIS — Z7984 Long term (current) use of oral hypoglycemic drugs: Secondary | ICD-10-CM | POA: Diagnosis not present

## 2023-09-26 DIAGNOSIS — E1159 Type 2 diabetes mellitus with other circulatory complications: Secondary | ICD-10-CM | POA: Diagnosis not present

## 2023-09-26 DIAGNOSIS — Z4731 Aftercare following explantation of shoulder joint prosthesis: Secondary | ICD-10-CM | POA: Diagnosis not present

## 2023-09-26 DIAGNOSIS — Z7985 Long-term (current) use of injectable non-insulin antidiabetic drugs: Secondary | ICD-10-CM | POA: Diagnosis not present

## 2023-09-26 DIAGNOSIS — E1140 Type 2 diabetes mellitus with diabetic neuropathy, unspecified: Secondary | ICD-10-CM | POA: Diagnosis not present

## 2023-09-26 DIAGNOSIS — I5032 Chronic diastolic (congestive) heart failure: Secondary | ICD-10-CM | POA: Diagnosis not present

## 2023-09-26 DIAGNOSIS — Z96611 Presence of right artificial shoulder joint: Secondary | ICD-10-CM | POA: Diagnosis not present

## 2023-09-28 ENCOUNTER — Telehealth: Payer: Self-pay | Admitting: *Deleted

## 2023-09-28 ENCOUNTER — Telehealth: Payer: Self-pay | Admitting: Orthopedic Surgery

## 2023-09-28 NOTE — Telephone Encounter (Signed)
Call from [place] PT with [facility]. Stated PT eval was done [place]; pt had shoulder surgery. Requesting verbal order for "PT 3 times a week x 1 week then 2 times a week x 2 weeks". VO given -  sending to team for approval or denial. Thanks

## 2023-09-28 NOTE — Telephone Encounter (Signed)
Pt called in requesting Oxycodone refill sent to [facility] pharmacy on file

## 2023-09-29 ENCOUNTER — Other Ambulatory Visit: Payer: Self-pay | Admitting: Surgical

## 2023-09-29 DIAGNOSIS — M7521 Bicipital tendinitis, right shoulder: Secondary | ICD-10-CM | POA: Diagnosis not present

## 2023-09-29 DIAGNOSIS — Z96611 Presence of right artificial shoulder joint: Secondary | ICD-10-CM | POA: Diagnosis not present

## 2023-09-29 DIAGNOSIS — I5032 Chronic diastolic (congestive) heart failure: Secondary | ICD-10-CM | POA: Diagnosis not present

## 2023-09-29 DIAGNOSIS — Z7984 Long term (current) use of oral hypoglycemic drugs: Secondary | ICD-10-CM | POA: Diagnosis not present

## 2023-09-29 DIAGNOSIS — E1140 Type 2 diabetes mellitus with diabetic neuropathy, unspecified: Secondary | ICD-10-CM | POA: Diagnosis not present

## 2023-09-29 DIAGNOSIS — D509 Iron deficiency anemia, unspecified: Secondary | ICD-10-CM | POA: Diagnosis not present

## 2023-09-29 DIAGNOSIS — E1159 Type 2 diabetes mellitus with other circulatory complications: Secondary | ICD-10-CM | POA: Diagnosis not present

## 2023-09-29 DIAGNOSIS — Z4731 Aftercare following explantation of shoulder joint prosthesis: Secondary | ICD-10-CM | POA: Diagnosis not present

## 2023-09-29 DIAGNOSIS — I110 Hypertensive heart disease with heart failure: Secondary | ICD-10-CM | POA: Diagnosis not present

## 2023-09-29 DIAGNOSIS — Z7985 Long-term (current) use of injectable non-insulin antidiabetic drugs: Secondary | ICD-10-CM | POA: Diagnosis not present

## 2023-09-29 MED ORDER — OXYCODONE HCL 5 MG PO TABS: 5.0000 mg | ORAL_TABLET | Freq: Four times a day (QID) | ORAL | 0 refills | Status: DC | PRN

## 2023-09-29 NOTE — Telephone Encounter (Signed)
Sent in

## 2023-09-30 ENCOUNTER — Encounter: Payer: Self-pay | Admitting: Surgical

## 2023-09-30 ENCOUNTER — Other Ambulatory Visit (INDEPENDENT_AMBULATORY_CARE_PROVIDER_SITE_OTHER): Payer: Self-pay

## 2023-09-30 ENCOUNTER — Ambulatory Visit: Payer: Medicaid Other | Admitting: Surgical

## 2023-09-30 DIAGNOSIS — Z96611 Presence of right artificial shoulder joint: Secondary | ICD-10-CM | POA: Diagnosis not present

## 2023-09-30 NOTE — Progress Notes (Signed)
Post-Op Visit Note   Patient: [name]           Date of Birth: [birth year]           MRN: [id number] Visit Date: 09/30/2023 PCP: Philomena Doheny, MD   Assessment & Plan:  Chief Complaint:  Chief Complaint  Patient presents with   Right Shoulder - Follow-up    Reverse shoulder arthroplasty 09/15/2023   Visit Diagnoses:  1. S/P reverse total shoulder arthroplasty, right     Plan: [name] is a 62 y.o. female who presents s/p right reverse shoulder arthroplasty on 09/15/23.  Patient is doing well and pain is overall controlled.  Using CPM machine as instructed.  Denies any chest pain, SOB, fevers, chills.  No complaint of any instability symptoms.  Taking Tylenol and oxycodone on.  Only takes oxycodone about every 8 hours.  Doing home health physical therapy for her shoulder and has about 8 visits left..    On exam, patient has range of motion 25 degrees external rotation, 80 degrees abduction, 105 degrees forward elevation passively.  Intact EPL, FPL, finger abduction, finger adduction, pronation/supination, bicep, tricep, deltoid of operative extremity.  Axillary nerve intact with deltoid firing.  Incision is healing well without evidence of infection or dehiscence.  Incision was made sure to be covered with Steri-Strips from the proximal to distal aspect of the length of the incision.  2+ radial pulse of the operative extremity  Plan is discontinue sling.  Okay to very lightly lift with the operative extremity but no lifting anything heavier than a coffee cup or cell phone.  Continue with home health physical therapy to focus on passive and active range of motion.  Once she finishes this she will reach out to the office to discuss outpatient physical therapy.  Currently she is progressing well with her exercise program.  Do not want to externally rotate past 30 degrees to protect subscapularis repair.  Follow-up in 4 weeks for clinical recheck with Dr.  August Saucer.  Follow-Up Instructions: No follow-ups on file.   Orders:  Orders Placed This Encounter  Procedures   XR Shoulder Right   No orders of the defined types were placed in this encounter.   Imaging: No results found.  PMFS History: Patient Active Problem List   Diagnosis Date Noted   Biceps tendonitis on right 09/19/2023   OA (osteoarthritis) of shoulder 09/15/2023   S/P reverse total shoulder arthroplasty, right 09/15/2023   Chronic constipation 08/15/2023   History of iron deficiency 08/15/2023   Antiplatelet or antithrombotic long-term use 08/15/2023   Abdominal hernia 03/19/2023   Diabetic neuropathy (HCC) 08/29/2022   Vaginal lesion 08/29/2022   PTSD (post-traumatic stress disorder) 06/24/2022   GAD (generalized anxiety disorder) 06/24/2022   Major depressive disorder, recurrent episode, moderate (HCC) 06/24/2022   Urinary incontinence 06/20/2022   Intertrigo 06/20/2022   Arthritis of left shoulder region    S/P reverse total shoulder arthroplasty, 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   Healthcare maintenance 11/17/2021   Spondylolisthesis, lumbar region 02/11/2021    Class: Acute   Malignant neoplasm of upper-outer quadrant of right breast in female, estrogen receptor positive (HCC) 06/10/2018   Restless leg syndrome 02/04/2018   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   Depression 08/06/2015   Morbid obesity (HCC)    Hypertension associated with diabetes (HCC)    Aortic stenosis s/p TAVR    Chronic diastolic congestive heart failure (HCC)    COPD (chronic obstructive pulmonary disease) (HCC) 09/21/2013  Obstructive sleep apnea 09/21/2013   Nephrolithiasis 09/21/2013   Nocturnal hypoxemia 09/12/2013   Hyperlipidemia 11/30/2012   History of pulmonary embolus (PE) 07/09/2012   Normocytic anemia 02/08/2012   Type 2 diabetes mellitus without complication, without long-term current use of insulin (HCC) 02/08/2012   Asthma 02/08/2012   GERD (gastroesophageal reflux  disease)    CAD (coronary artery disease) 12/08/2011   Past Medical History:  Diagnosis Date   Anemia    Anxiety    Aortic valve stenosis, severe    Arthritis    PAIN AND SEVERE OA LEFT KNEE ; S/P RIGHT TKA ON 02/03/12; HAS LOWER BACK PAIN-UNABLE TO STAND MORE THAN 10 MIN; ARTHRITIS "ALL OVER"   Asthma    Blood transfusion    [facility]   Breast cancer in female [facility])    Right   CAD (coronary artery disease)    Cath 2010 with DES x 1 RCA-- PT'S CARDIOLOGIST IS DR. MCALHANY   Chronic diastolic congestive heart failure (HCC)    COPD (chronic obstructive pulmonary disease) (HCC)    Mild   COPD exacerbation (HCC) 11/16/2022   Depression    Diabetes mellitus DIAGNOSED IN2010   Dyspnea    with much ambulation   Eczema    on back   Headache    migraines younger- rare now 02/07/21   Heart murmur    no current problems   History of hiatal hernia    History of kidney stones    passed or blasted   History of UTI 12/05/2022   Hyperlipidemia    Hypertension    Morbid obesity with body mass index of 60.0-69.9 in adult [facility])    Myocardial infarction (HCC)    PT THINKS SHE WAS DX WITH MI AT THE TIME OF HEART STENTING   Neuromuscular disorder (HCC)    bilateral arm/hands   Oxygen dependent    uses 3L oxygen night/prn   Personal history of radiation therapy    Pneumonia    Pulmonary embolism (HCC) 02/08/2012   S/P RT TOTAL KNEE ON 02/03/12--ON 02/08/12--DEVELOPED ACUTE SOB AND CHEST PAIN--AND DIAGNOSED WITH  PULMONARY EMBOLUS AND PNEUMONIA   Restless leg syndrome    Sleep apnea    Uterine fibroid    NO PROBLEMS AT PRESENT FROM THE FIBROIDS-STATES SHE IS POST MENOPAUSAL-LAST MENSES 2010 EXCEPT FOR EPISODE THIS YR OF BLEEDING RELATED TO FIBROIDS.   Weakness    BOTH HANDS - S/P BILATERAL CARPAL TUNNEL RELEASE--BUT STILL HAS WEAKNESS--OFTEN DROPS THINGS    Family History  Problem Relation Age of Onset   Breast cancer Mother        stage IV at diagnosis   Emphysema Mother        smoked    Heart disease Mother    COPD Father        smoked   Asthma Father    Heart disease Father    Cancer Brother        Sinus   Colon cancer Neg Hx    Esophageal cancer Neg Hx    Inflammatory bowel disease Neg Hx    Liver disease Neg Hx    Pancreatic cancer Neg Hx    Rectal cancer Neg Hx    Stomach cancer Neg Hx     Past Surgical History:  Procedure Laterality Date   BACK SURGERY  02/11/2021   Dr Otelia Sergeant   BICEPT TENODESIS Right 09/15/2023   Procedure: BICEPS TENODESIS;  Surgeon: Cammy Copa, MD;  Location: [facility] OR;  Service: Orthopedics;  Laterality: Right;   BREAST BIOPSY Right 06/04/2018   BREAST LUMPECTOMY Right 06/2018   BREAST LUMPECTOMY WITH RADIOACTIVE SEED AND SENTINEL LYMPH NODE BIOPSY Right 07/19/2018   Procedure: BREAST LUMPECTOMY WITH RADIOACTIVE SEED AND SENTINEL LYMPH NODE BIOPSY;  Surgeon: Ovidio Kin, MD;  Location: [facility] OR;  Service: General;  Laterality: Right;   CARPAL TUNNEL RELEASE     Bilateral   CHOLECYSTECTOMY     COLONOSCOPY  2010   CORONARY ANGIOPLASTY     2010 has stent in place   CYSTOSCOPY W/ RETROGRADES Right 09/21/2013   Procedure: CYSTOSCOPY WITH RIGHT RETROGRADE PYELOGRAM RIGHT DOUBLE J STENT ;  Surgeon: Antony Haste, MD;  Location: [facility];  Service: Urology;  Laterality: Right;   CYSTOSCOPY W/ URETERAL STENT PLACEMENT Right 11/16/2022   Procedure: CYSTOSCOPY WITH RETROGRADE PYELOGRAM/URETERAL STENT PLACEMENT;  Surgeon: Despina Arias, MD;  Location: [facility];  Service: Urology;  Laterality: Right;   CYSTOSCOPY WITH RETROGRADE PYELOGRAM, URETEROSCOPY AND STENT PLACEMENT Right 01/13/2023   Procedure: CYSTOSCOPY WITH RETROGRADE PYELOGRAM, URETEROSCOPY AND STENT EXCHANGE;  Surgeon: Milderd Meager., MD;  Location: [facility];  Service: Urology;  Laterality: Right;   CYSTOSCOPY WITH URETEROSCOPY AND STENT PLACEMENT Right 10/25/2013   Procedure: CYSTOSCOPY RIGHT URETEROSCOPY HOLMIUM LASER LITHO AND STENT PLACEMENT;  Surgeon: Antony Haste, MD;  Location: [facility];  Service: Urology;  Laterality: Right;   HERNIA REPAIR  1995   umbilical ,   HOLMIUM LASER APPLICATION Right 01/13/2023   Procedure: HOLMIUM LASER APPLICATION;  Surgeon: Milderd Meager., MD;  Location: [facility];  Service: Urology;  Laterality: Right;   INTRAOPERATIVE TRANSESOPHAGEAL ECHOCARDIOGRAM N/A 12/12/2014   Procedure: INTRAOPERATIVE TRANSESOPHAGEAL ECHOCARDIOGRAM;  Surgeon: Kathleene Hazel, MD;  Location[facility];  Service: Cardiovascular;  Laterality: N/A;   KNEE ARTHROPLASTY  02/03/2012   Procedure: COMPUTER ASSISTED TOTAL KNEE ARTHROPLASTY;  Surgeon: [name], MD;  Location: [facility];  Service: Orthopedics;  Laterality: Right;  Right total knee arthroplasty   LEFT AND RIGHT HEART CATHETERIZATION WITH CORONARY ANGIOGRAM N/A 03/17/2013   Procedure: LEFT AND RIGHT HEART CATHETERIZATION WITH CORONARY ANGIOGRAM;  Surgeon: Kathleene Hazel, MD;  Location[facility] C[facility] LAB;  Service: Cardiovascular;  Laterality: N/A;   LEFT AND RIGHT HEART CATHETERIZATION WITH CORONARY/GRAFT ANGIOGRAM N/A 09/14/2014   Procedure: LEFT AND RIGHT HEART CATHETERIZATION WITH Isabel Caprice;  Surgeon: Kathleene Hazel, MD;  Location: [facility] CATH LAB;  Service: Cardiovascular;  Laterality: N/A;   REVERSE SHOULDER ARTHROPLASTY Left 03/06/2022   Procedure: LEFT SHOULDER REPLACEMENT APPLICATION OF WOUND VAC;  Surgeon: Cammy Copa, MD;  Location: [facility];  Service: Orthopedics;  Laterality: Left;   REVERSE SHOULDER ARTHROPLASTY Right 09/15/2023   Procedure: REVERSE SHOULDER ARTHROPLASTY;  Surgeon: Cammy Copa, MD;  Location: [facility] OR;  Service: Orthopedics;  Laterality: Right;   TEE WITHOUT CARDIOVERSION N/A 03/14/2013   Procedure: TRANSESOPHAGEAL ECHOCARDIOGRAM (TEE);  Surgeon: Lewayne Bunting, MD;  Location: [facility] ENDOSCOPY;  Service: Cardiovascular;  Laterality: N/A;   TEE WITHOUT CARDIOVERSION N/A 11/14/2014   Procedure: TRANSESOPHAGEAL  ECHOCARDIOGRAM (TEE);  Surgeon: Lewayne Bunting, MD;  Location: [facility] ENDOSCOPY;  Service: Cardiovascular;  Laterality: N/A;   TONSILLECTOMY     maybe as a child- does not know   TOTAL KNEE ARTHROPLASTY  09/10/2012   Procedure: TOTAL KNEE ARTHROPLASTY;  Surgeon: [name], MD;  Location: [facility];  Service: Orthopedics;  Laterality: Left;   TOTAL KNEE REVISION Right 07/15/2013   Procedure: REVISION ARTHROPLASTY RIGHT  KNEE;  Surgeon: [name], MD;  Location: [facility];  Service: Orthopedics;  Laterality: Right;   TRANSCATHETER AORTIC VALVE REPLACEMENT, TRANSFEMORAL N/A 12/12/2014   Procedure: TRANSCATHETER AORTIC VALVE REPLACEMENT, TRANSFEMORAL;  Surgeon: Kathleene Hazel, MD;  Location: [facility] OR;  Service: Cardiovascular;  Laterality: N/A;   TRIGGER FINGER RELEASE  09/10/2012   Procedure: RELEASE TRIGGER FINGER/A-1 PULLEY;  Surgeon: [name], MD;  Location: [facility];  Service: Orthopedics;  Laterality: Right;  Right Ring Finger   TUBAL LIGATION     Social History   Occupational History   Occupation: Disabled  Tobacco Use   Smoking status: Former    Current packs/day: 0.00    Average packs/day: 1.5 packs/day for 30.0 years (45.0 ttl pk-yrs)    Types: Cigarettes    Start date: 12/29/1970    Quit date: 12/29/2000    Years since quitting: 22.7   Smokeless tobacco: Never  Vaping Use   Vaping status: Never Used  Substance and Sexual Activity   Alcohol use: Not Currently   Drug use: Not Currently    Types: Marijuana    Comment: CBD oils through vape shops   Sexual activity: Not Currently    Birth control/protection: Surgical, Post-menopausal    Comment: tubal ligation

## 2023-10-01 DIAGNOSIS — M7521 Bicipital tendinitis, right shoulder: Secondary | ICD-10-CM | POA: Diagnosis not present

## 2023-10-01 DIAGNOSIS — Z96611 Presence of right artificial shoulder joint: Secondary | ICD-10-CM | POA: Diagnosis not present

## 2023-10-01 DIAGNOSIS — E1140 Type 2 diabetes mellitus with diabetic neuropathy, unspecified: Secondary | ICD-10-CM | POA: Diagnosis not present

## 2023-10-01 DIAGNOSIS — D509 Iron deficiency anemia, unspecified: Secondary | ICD-10-CM | POA: Diagnosis not present

## 2023-10-01 DIAGNOSIS — E1159 Type 2 diabetes mellitus with other circulatory complications: Secondary | ICD-10-CM | POA: Diagnosis not present

## 2023-10-01 DIAGNOSIS — Z4731 Aftercare following explantation of shoulder joint prosthesis: Secondary | ICD-10-CM | POA: Diagnosis not present

## 2023-10-01 DIAGNOSIS — Z7984 Long term (current) use of oral hypoglycemic drugs: Secondary | ICD-10-CM | POA: Diagnosis not present

## 2023-10-01 DIAGNOSIS — I5032 Chronic diastolic (congestive) heart failure: Secondary | ICD-10-CM | POA: Diagnosis not present

## 2023-10-01 DIAGNOSIS — Z7985 Long-term (current) use of injectable non-insulin antidiabetic drugs: Secondary | ICD-10-CM | POA: Diagnosis not present

## 2023-10-01 DIAGNOSIS — I110 Hypertensive heart disease with heart failure: Secondary | ICD-10-CM | POA: Diagnosis not present

## 2023-10-02 ENCOUNTER — Telehealth: Payer: Self-pay

## 2023-10-02 DIAGNOSIS — I5032 Chronic diastolic (congestive) heart failure: Secondary | ICD-10-CM | POA: Diagnosis not present

## 2023-10-02 DIAGNOSIS — I110 Hypertensive heart disease with heart failure: Secondary | ICD-10-CM | POA: Diagnosis not present

## 2023-10-02 DIAGNOSIS — Z7985 Long-term (current) use of injectable non-insulin antidiabetic drugs: Secondary | ICD-10-CM | POA: Diagnosis not present

## 2023-10-02 DIAGNOSIS — M7521 Bicipital tendinitis, right shoulder: Secondary | ICD-10-CM | POA: Diagnosis not present

## 2023-10-02 DIAGNOSIS — Z4731 Aftercare following explantation of shoulder joint prosthesis: Secondary | ICD-10-CM | POA: Diagnosis not present

## 2023-10-02 DIAGNOSIS — Z7984 Long term (current) use of oral hypoglycemic drugs: Secondary | ICD-10-CM | POA: Diagnosis not present

## 2023-10-02 DIAGNOSIS — Z96611 Presence of right artificial shoulder joint: Secondary | ICD-10-CM | POA: Diagnosis not present

## 2023-10-02 DIAGNOSIS — E1140 Type 2 diabetes mellitus with diabetic neuropathy, unspecified: Secondary | ICD-10-CM | POA: Diagnosis not present

## 2023-10-02 DIAGNOSIS — E1159 Type 2 diabetes mellitus with other circulatory complications: Secondary | ICD-10-CM | POA: Diagnosis not present

## 2023-10-02 DIAGNOSIS — D509 Iron deficiency anemia, unspecified: Secondary | ICD-10-CM | POA: Diagnosis not present

## 2023-10-02 NOTE — Telephone Encounter (Signed)
Mark,  HHPT with [facility] called stating that patient bumped her right shoulder last night in the bathroom and now she has some swelling and her pain level is a 6 out of 10.  Stated that patient did take some pain medicine and a muscle relaxer.  Patient had right shoulder surgery on 09/15/2023.  CB# [phone].  Please advise.  Thank you.

## 2023-10-02 NOTE — Telephone Encounter (Signed)
Recommend waiting it out ovr the weekend and if it's increased pain still next week we can work her in

## 2023-10-05 DIAGNOSIS — E1140 Type 2 diabetes mellitus with diabetic neuropathy, unspecified: Secondary | ICD-10-CM | POA: Diagnosis not present

## 2023-10-05 DIAGNOSIS — I5032 Chronic diastolic (congestive) heart failure: Secondary | ICD-10-CM | POA: Diagnosis not present

## 2023-10-05 DIAGNOSIS — D509 Iron deficiency anemia, unspecified: Secondary | ICD-10-CM | POA: Diagnosis not present

## 2023-10-05 DIAGNOSIS — I110 Hypertensive heart disease with heart failure: Secondary | ICD-10-CM | POA: Diagnosis not present

## 2023-10-05 DIAGNOSIS — M7521 Bicipital tendinitis, right shoulder: Secondary | ICD-10-CM | POA: Diagnosis not present

## 2023-10-05 DIAGNOSIS — Z4731 Aftercare following explantation of shoulder joint prosthesis: Secondary | ICD-10-CM | POA: Diagnosis not present

## 2023-10-05 DIAGNOSIS — Z7985 Long-term (current) use of injectable non-insulin antidiabetic drugs: Secondary | ICD-10-CM | POA: Diagnosis not present

## 2023-10-05 DIAGNOSIS — Z96611 Presence of right artificial shoulder joint: Secondary | ICD-10-CM | POA: Diagnosis not present

## 2023-10-05 DIAGNOSIS — E1159 Type 2 diabetes mellitus with other circulatory complications: Secondary | ICD-10-CM | POA: Diagnosis not present

## 2023-10-05 DIAGNOSIS — Z7984 Long term (current) use of oral hypoglycemic drugs: Secondary | ICD-10-CM | POA: Diagnosis not present

## 2023-10-05 NOTE — Telephone Encounter (Signed)
IC talked with patient. She is feeling much better today. She will call us if anything changes.

## 2023-10-07 ENCOUNTER — Telehealth (HOSPITAL_COMMUNITY): Payer: Medicaid Other | Admitting: Psychiatry

## 2023-10-07 DIAGNOSIS — Z96611 Presence of right artificial shoulder joint: Secondary | ICD-10-CM | POA: Diagnosis not present

## 2023-10-07 DIAGNOSIS — D509 Iron deficiency anemia, unspecified: Secondary | ICD-10-CM | POA: Diagnosis not present

## 2023-10-07 DIAGNOSIS — I110 Hypertensive heart disease with heart failure: Secondary | ICD-10-CM | POA: Diagnosis not present

## 2023-10-07 DIAGNOSIS — M7521 Bicipital tendinitis, right shoulder: Secondary | ICD-10-CM | POA: Diagnosis not present

## 2023-10-07 DIAGNOSIS — E1140 Type 2 diabetes mellitus with diabetic neuropathy, unspecified: Secondary | ICD-10-CM | POA: Diagnosis not present

## 2023-10-07 DIAGNOSIS — E1159 Type 2 diabetes mellitus with other circulatory complications: Secondary | ICD-10-CM | POA: Diagnosis not present

## 2023-10-07 DIAGNOSIS — Z7984 Long term (current) use of oral hypoglycemic drugs: Secondary | ICD-10-CM | POA: Diagnosis not present

## 2023-10-07 DIAGNOSIS — Z7985 Long-term (current) use of injectable non-insulin antidiabetic drugs: Secondary | ICD-10-CM | POA: Diagnosis not present

## 2023-10-07 DIAGNOSIS — I5032 Chronic diastolic (congestive) heart failure: Secondary | ICD-10-CM | POA: Diagnosis not present

## 2023-10-07 DIAGNOSIS — Z4731 Aftercare following explantation of shoulder joint prosthesis: Secondary | ICD-10-CM | POA: Diagnosis not present

## 2023-10-13 DIAGNOSIS — I110 Hypertensive heart disease with heart failure: Secondary | ICD-10-CM | POA: Diagnosis not present

## 2023-10-13 DIAGNOSIS — I5032 Chronic diastolic (congestive) heart failure: Secondary | ICD-10-CM | POA: Diagnosis not present

## 2023-10-13 DIAGNOSIS — Z4731 Aftercare following explantation of shoulder joint prosthesis: Secondary | ICD-10-CM | POA: Diagnosis not present

## 2023-10-13 DIAGNOSIS — E1159 Type 2 diabetes mellitus with other circulatory complications: Secondary | ICD-10-CM | POA: Diagnosis not present

## 2023-10-13 DIAGNOSIS — D509 Iron deficiency anemia, unspecified: Secondary | ICD-10-CM | POA: Diagnosis not present

## 2023-10-13 DIAGNOSIS — Z96611 Presence of right artificial shoulder joint: Secondary | ICD-10-CM | POA: Diagnosis not present

## 2023-10-13 DIAGNOSIS — Z7984 Long term (current) use of oral hypoglycemic drugs: Secondary | ICD-10-CM | POA: Diagnosis not present

## 2023-10-13 DIAGNOSIS — E1140 Type 2 diabetes mellitus with diabetic neuropathy, unspecified: Secondary | ICD-10-CM | POA: Diagnosis not present

## 2023-10-13 DIAGNOSIS — M7521 Bicipital tendinitis, right shoulder: Secondary | ICD-10-CM | POA: Diagnosis not present

## 2023-10-13 DIAGNOSIS — Z7985 Long-term (current) use of injectable non-insulin antidiabetic drugs: Secondary | ICD-10-CM | POA: Diagnosis not present

## 2023-10-14 DIAGNOSIS — E1159 Type 2 diabetes mellitus with other circulatory complications: Secondary | ICD-10-CM | POA: Diagnosis not present

## 2023-10-14 DIAGNOSIS — Z4731 Aftercare following explantation of shoulder joint prosthesis: Secondary | ICD-10-CM | POA: Diagnosis not present

## 2023-10-14 DIAGNOSIS — D509 Iron deficiency anemia, unspecified: Secondary | ICD-10-CM | POA: Diagnosis not present

## 2023-10-14 DIAGNOSIS — Z7985 Long-term (current) use of injectable non-insulin antidiabetic drugs: Secondary | ICD-10-CM | POA: Diagnosis not present

## 2023-10-14 DIAGNOSIS — Z7984 Long term (current) use of oral hypoglycemic drugs: Secondary | ICD-10-CM | POA: Diagnosis not present

## 2023-10-14 DIAGNOSIS — I110 Hypertensive heart disease with heart failure: Secondary | ICD-10-CM | POA: Diagnosis not present

## 2023-10-14 DIAGNOSIS — M7521 Bicipital tendinitis, right shoulder: Secondary | ICD-10-CM | POA: Diagnosis not present

## 2023-10-14 DIAGNOSIS — I5032 Chronic diastolic (congestive) heart failure: Secondary | ICD-10-CM | POA: Diagnosis not present

## 2023-10-14 DIAGNOSIS — Z96611 Presence of right artificial shoulder joint: Secondary | ICD-10-CM | POA: Diagnosis not present

## 2023-10-14 DIAGNOSIS — E1140 Type 2 diabetes mellitus with diabetic neuropathy, unspecified: Secondary | ICD-10-CM | POA: Diagnosis not present

## 2023-10-20 ENCOUNTER — Other Ambulatory Visit: Payer: Self-pay | Admitting: Obstetrics and Gynecology

## 2023-10-20 NOTE — Patient Instructions (Signed)
Hi [name], I am glad you are doing so well-have a great day!  [name] was given information about Medicaid Managed Care team care coordination services as a part of their [facility] Medicaid benefit. [name] verbally consented to engagement with the [facility] Managed Care team.   If you are experiencing a medical emergency, please call 911 or report to your local emergency department or urgent care.   If you have a non-emergency medical problem during routine business hours, please contact your provider's office and ask to speak with a nurse.   For questions related to your [facility] health plan, please call: [phone] or go here:[email or web address]  If you would like to schedule transportation through your [facility] plan, please call the following number at least 2 days in advance of your appointment: [phone].   You can also use the MTM portal or MTM mobile app to manage your rides. Reimbursement for transportation is available through [facility]! For the portal, please go to [email or web address]  Call the [facility] Crisis Line at [phone], at any time, 24 hours a day, 7 days a week. If you are in danger or need immediate medical attention call 911.  If you would like help to quit smoking, call 1-800-QUIT-NOW (684-732-9850) OR Espaol: 1-855-Djelo-Ya (2-725-366-4403) o para ms informacin haga clic aqu or Text READY to 474-259 to register via text  [name] - following are the goals we discussed in your visit today:   Goals Addressed    Timeframe:  Long-Range Goal Priority:  High Start Date:      04/14/22                       Expected End Date:     ongoing                  Follow Up Date: 12/04/23   - schedule appointment for flu shot - schedule appointment for vaccines needed due to my age or health - schedule recommended health tests (blood work, mammogram, colonoscopy, pap test) - schedule and keep appointment for  annual check-up    Why is this important?   Screening tests can find diseases early when they are easier to treat.  Your doctor or nurse will talk with you about which tests are important for you.  Getting shots for common diseases like the flu and shingles will help prevent them.  10/20/23:  Continues therapy appts, has completed PT, ORTHO f/u 10/30.  Patient verbalizes understanding of instructions and care plan provided today and agrees to view in MyChart. Active MyChart status and patient understanding of how to access instructions and care plan via MyChart confirmed with patient.     The Managed Medicaid care management team will [name] out to the patient again over the next 60 business  days.  The  Patient  has been provided with contact information for the Managed Medicaid care management team and has been advised to call with any health related questions or concerns.   [name] RN, BSN   [facility] Care Management Coordinator - Managed Medicaid High Risk [phone]   Following is a copy of your plan of care:  Care Plan : RN Care Manager Plan of Care  Updates made by [name], RN since 10/20/2023 12:00 AM     Problem: Health Promotion or Disease Self-Management (General Plan of Care)   Priority: High  Onset Date: 04/14/2022     Long-Range Goal:  Chronic Disease Management and Care Coordination Needs   Start Date: 04/14/2022  Expected End Date: 01/20/2024  Recent Progress: Not on track  Priority: High  Note:   Current Barriers:  Knowledge Deficits related to plan of care for management of Anxiety and Depression along with other chronic health conditions, HTN, DM, HF, asthma, COPD, OSA, arthritis, GERD, CAD, HLD, h/o breast cancer, RLS Chronic Disease Management support and education needs related to Anxiety and Depression. DM, HTN, HF, asthma, OSA, arthritis, GERD, CAD, HLD, h/o breast cancer, RLS. 10/20/23:  Patient doing really well since  right shoulder surgery-has completed PT. Blood sugars stable and BP.  Continues therapy services, ORTHO f/u next week.    RNCM Clinical Goal(s):  Patient will verbalize understanding of plan for management of Anxiety and Depression, DM, HTN  as evidenced by patient report take all medications exactly as prescribed and will call provider for medication related questions as evidenced by patient report demonstrate understanding of rationale for each prescribed medication as evidenced by patient report attend all scheduled medical appointments  as evidenced by patient report continue to work with RN Care Manager to address care management and care coordination needs related to  Anxiety and Depression, DM, HTN as evidenced by adherence to CM Team Scheduled appointments work with Child psychotherapist to address  related to the management of Mental Health Concerns  related to the management of Anxiety and Depression as evidenced by review of EMR and patient or Child psychotherapist report through collaboration with Medical illustrator, provider, and care team.   Interventions: Inter-disciplinary care team collaboration (see longitudinal plan of care) Evaluation of current treatment plan related to  self management and patient's adherence to plan as established by provider   (Status:  New goal.)  Long Term Goal Evaluation of current treatment plan related to Anxiety and Depression, Mental Health Concerns  self-management and patient's adherence to plan as established by provider. Discussed plans with patient for ongoing care management follow up and provided patient with direct contact information for care management team Advised patient to contact provider for any additional DME needs, CBG, and possible general surgeon referral. Reviewed medications with patient Collaborated with LCSW regarding anxiety/depression Reviewed scheduled/upcoming provider appointments Advised patient, providing education and rationale, to check  cbg and record, calling provider  for findings outside established parameters  Social Work referral for anxiety/depression Discussed plans with patient for ongoing care management follow up and provided patient with direct contact information for care management team Assessed social determinant of health barriers Collaborated with Lubertha Sayres Managed Medicaid Liaison regarding DME-motorized wheelchair and lift chair. LCSW appointment rescheduled. Collaborated with Pharmacist. Pharmacy referral for polypharmacy at patient's request-completed  Asthma: (Status:New goal.) Long Term Goal Advised patient to track and manage Asthma triggers Advised patient to engage in light exercise as tolerated 3-5 days a week to aid in the the management of Asthma Provided education about and advised patient to utilize infection prevention strategies to reduce risk of respiratory infection Discussed the importance of adequate rest and management of fatigue with Asthma Assessed social determinant of health barriers   CAD Interventions: (Status:  New goal.) Long Term Goal Assessed understanding of CAD diagnosis Medications reviewed including medications utilized in CAD treatment plan Provided education on importance of blood pressure control in management of CAD Provided education on Importance of limiting foods high in cholesterol Counseled on importance of regular laboratory monitoring as prescribed Counseled on the importance of exercise goals with target of 150 minutes per week  Reviewed Importance of taking all medications as prescribed Reviewed Importance of attending all scheduled provider appointments Advised to report any changes in symptoms or exercise tolerance Assessed social determinant of health barriers  Heart Failure Interventions:  (Status:  New goal.) Long Term Goal Basic overview and discussion of pathophysiology of Heart Failure reviewed Provided education on low sodium  diet Assessed need for readable accurate scales in home Provided education about placing scale on hard, flat surface Advised patient to weigh each morning after emptying bladder Discussed importance of daily weight and advised patient to weigh and record daily Discussed the importance of keeping all appointments with provider Provided patient with education about the role of exercise in the management of heart failure Assessed social determinant of health barriers   COPD Interventions:  (Status:  New goal.) Long Term Goal Advised patient to track and manage COPD triggers Advised patient to self assesses COPD action plan zone and make appointment with provider if in the yellow zone for 48 hours without improvement Advised patient to engage in light exercise as tolerated 3-5 days a week to aid in the the management of COPD Provided education about and advised patient to utilize infection prevention strategies to reduce risk of respiratory infection Discussed the importance of adequate rest and management of fatigue with COPD Assessed social determinant of health barriers  Hyperlipidemia Interventions:  (Status:  New goal.) Long Term Goal Medication review performed; medication list updated in electronic medical record.  Provider established cholesterol goals reviewed Counseled on importance of regular laboratory monitoring as prescribed Reviewed importance of limiting foods high in cholesterol Reviewed exercise goals and target of 150 minutes per week Assessed social determinant of health barriers    Diabetes Interventions:  (Status:  New goal.) Long Term Goal Assessed patient's understanding of A1c goal: <7% Reviewed medications with patient and discussed importance of medication adherence Reviewed scheduled/upcoming provider appointments  Advised patient, providing education and rationale, to check cbg and record, calling provider for findings outside established parameters Review of  patient status, including review of consultants reports, relevant laboratory and other test results, and medications completed Assessed social determinant of health barriers Lab Results  Component Value Date   HGBA1C 7.1 (A) 08/27/2022   HGB A1C  7.3 on 06/03/23 HGBA1C   7.0 on 08/18/23 HGBA1C  7.1 on 09/09/23  Hypertension Interventions:  (Status:  New goal.) Long Term Goal Last practice recorded BP readings:  BP Readings from Last 3 Encounters:        08/18/23         153/74 10/20/23         114/74 06/03/23         127/59  Most recent eGFR/CrCl:  Lab Results  Component Value Date   EGFR 90 06/04/2022    No components found for: "CRCL"  Evaluation of current treatment plan related to hypertension self management and patient's adherence to plan as established by provider Reviewed medications with patient and discussed importance of compliance Discussed plans with patient for ongoing care management follow up and provided patient with direct contact information for care management team Advised patient, providing education and rationale, to monitor blood pressure daily and record, calling PCP for findings outside established parameters Reviewed scheduled/upcoming provider appointments including:  Assessed social determinant of health barriers   Patient Goals/Self-Care Activities: Take all medications as prescribed Attend all scheduled provider appointments Call pharmacy for medication refills 3-7 days in advance of running out of medications Perform all self care activities independently  Perform IADL's (shopping, preparing meals, housekeeping, managing finances) independently Call provider office for new concerns or questions  Work with the social worker to address care coordination needs and will continue to work with the clinical team to address health care and disease management related needs Patient to f/u with provider regarding sciatica pain-completed  Follow Up Plan:  The  patient has been provided with contact information for the care management team and has been advised to call with any health related questions or concerns.  The care management team will [name] out to the patient again over the next 60 business  days.

## 2023-10-20 NOTE — Patient Outreach (Signed)
Medicaid Managed Care   Nurse Care Manager Note  10/20/2023 Name:  [name] MRN:  [id number] DOB:  10-09-1961  [name] is an 62 y.o. year old female who is a primary patient of [name] Doheny, MD.  The Medicaid Managed Care Coordination team was consulted for assistance with:    Chronic healthcare management needs, GERD, OSA, HF, CAD, HLD, RLS, COPD, asthma, h/o breast cancer, sciatica, HTN, DM, neuropathy, anxiety/depression/PTSD  [name] was given information about Medicaid Managed Care Coordination team services today. [name] Patient agreed to services and verbal consent obtained.  Engaged with patient by telephone for follow up visit in response to provider referral for case management and/or care coordination services.   Assessments/Interventions:  Review of past medical history, allergies, medications, health status, including review of consultants reports, laboratory and other test data, was performed as part of comprehensive evaluation and provision of chronic care management services.  SDOH (Social Determinants of Health) assessments and interventions performed: SDOH Interventions    Flowsheet Row Patient Outreach Telephone from 10/20/2023 in [place] [facility]PARTMENT Patient Outreach Telephone from 09/21/2023 in [place] POP[facility]ATION [facility] DEPARTMENT Patient Outreach Telephone from 09/07/2023 in [place] POPULATION HEALTH DEPARTMENT Patient Outreach Telephone from 08/21/2023 in [place] POPULATION HEALTH DEPARTMENT Patient Outreach Telephone from 07/21/2023 in [place] POPULATION HEALTH DEPARTMENT Video Visit from 07/08/2023 in [facility]  SDOH Interventions        Transportation Interventions -- Intervention Not Indicated -- -- -- --  Utilities Interventions -- Intervention Not Indicated -- -- -- --  Alcohol Usage Interventions -- -- -- Intervention Not Indicated (Score <7) -- --   Depression Interventions/Treatment  -- -- -- -- -- Currently on Treatment  Financial Strain Interventions Intervention Not Indicated -- -- -- -- --  Physical Activity Interventions -- -- -- -- Other (Comments)  [not able to perform exercise at this level] --  Stress Interventions -- -- [employer], Provide Counseling -- -- --  Social Connections Interventions Intervention Not Indicated -- -- -- -- --  Health Literacy Interventions -- -- -- -- Intervention Not Indicated --     Care Plan No Known Allergies  Medications Reviewed Today     Reviewed by [name], RN (Registered Nurse) on 10/20/23 at 1042  Med List Status: <None>   Medication Order Taking? Sig Documenting Provider Last Dose Status Informant  acetaminophen (TYLENOL) 500 MG tablet [id number] No Take 1,000 mg by mouth every 6 (six) hours as needed for moderate pain. [provider] Taking Active Self  amLODipine (NORVASC) 5 MG tablet [id number] No Take 1 tablet (5 mg total) by mouth daily. Evlyn Kanner, MD Taking Active Self  aspirin EC 81 MG tablet [id number] No Take 1 tablet (81 mg total) by mouth daily. Swallow whole. Magnant, Joycie Peek, PA-C Taking Active   atorvastatin (LIPITOR) 80 MG tablet [id number] No Take 1 tablet (80 mg total) by mouth daily. Evlyn Kanner, MD Taking Active Self  Blood Pressure Monitoring (BLOOD PRESSURE CUFF) MISC [id number] No Please follow insert instructions. [name] Doheny, MD Taking Active Self  busPIRone (BUSPAR) 15 MG tablet [id number] No Take 1 tablet (15 mg total) by mouth 2 (two) times daily. Shanna Cisco, NP Taking Active   cholecalciferol (VITAMIN D3) 25 MCG (1000 UNIT) tablet [id number] No Take 1,000 Units by mouth daily. [provider] Taking Active Self  clopidogrel (PLAVIX) 75 MG tablet [id number] No TAKE 1 TABLET  BY MOUTH IN THE Wylie Hail, MD Taking Active Self  Continuous Glucose Receiver (DEXCOM G7  RECEIVER) DEVI [id number] No 1 Units by Does not apply route daily. Faith Rogue, DO Taking Active   Continuous Glucose Sensor (DEXCOM G7 SENSOR) MISC [id number] No Place new sensor every 10 days. Use to monitor blood sugar continuously. Modena Slater, DO Taking Active   Continuous Glucose Sensor (DEXCOM G7 SENSOR) MISC [id number] No 1 Units by Does not apply route daily. Faith Rogue, DO Taking Active   Continuous Glucose Sensor (FREESTYLE LIBRE 3 SENSOR) [place] [id number] No Place 1 sensor on the skin every 14 days. Use to check glucose continuously Reymundo Poll, MD Taking Active Self  DULoxetine (CYMBALTA) 20 MG capsule [id number] No Take 1 capsule (20 mg total) by mouth daily. Shanna Cisco, NP Taking Active   Evolocumab [facility] SURECLICK) 140 MG/ML Ivory Broad [id number] No Inject 140 mg into the skin every 14 (fourteen) days. Reymundo Poll, MD Taking Active Self  exemestane (AROMASIN) 25 MG tablet [id number] No TAKE 1 TABLET BY MOUTH ONCE DAILY AFTER Philippa Sicks, MD Taking Active Self  ezetimibe (ZETIA) 10 MG tablet [id number] No Take 1 tablet by mouth once daily  Patient taking differently: Take 10 mg by mouth at bedtime.   Swinyer, Zachary George, NP Taking Active Self  Discontinued 05/09/20 1429   Fluticasone-Umeclidin-Vilant (TRELEGY ELLIPTA) 100-62.5-25 MCG/ACT AEPB [id number] No INHALE 1 PUFF ONCE DAILY Faith Rogue, DO Taking Active Self  furosemide (LASIX) 40 MG tablet [id number] No Take 1 tablet (40 mg total) by mouth as needed for fluid or edema. Chauncey Mann, DO Taking Active Self  gabapentin (NEURONTIN) 300 MG capsule [id number] No TAKE 2 CAPSULES BY MOUTH IN THE MORNING AND 3 AT BEDTIME  Patient taking differently: Take 300-900 mg by mouth See admin instructions. TAKE 300 mg BY MOUTH IN THE MORNING AND 900 mg AT BEDTIME   Steffanie Rainwater, MD Taking Active Self           Med Note Gunnar Fusi, MELISSA R   Thu Aug 27, 2023  1:05 PM)    hydrocortisone cream 1 % [id number]  No Apply 1 Application topically daily as needed for itching. [provider] Taking Active Self  losartan-hydrochlorothiazide (HYZAAR) 100-25 MG tablet [id number] No Take 1 tablet by mouth daily. Please repeat kidney labs to get additional refills Rana Snare, DO Taking Active   metFORMIN (GLUCOPHAGE-XR) 500 MG 24 hr tablet [id number] No Take 1 tablet (500 mg total) by mouth 2 (two) times daily. Reymundo Poll, MD Taking Active Self  methocarbamol (ROBAXIN) 500 MG tablet [id number] No Take 1 tablet (500 mg total) by mouth every 8 (eight) hours as needed for muscle spasms. Magnant, Joycie Peek, PA-C Taking Active   metoprolol succinate (TOPROL-XL) 50 MG 24 hr tablet [id number] No TAKE 1 TABLET BY MOUTH ONCE DAILY WITH MEALS OR  IMMEDIATELY  FOLLOWING  A  MEAL Evlyn Kanner, MD Taking Active Self  montelukast (SINGULAIR) 10 MG tablet [id number] No TAKE 1 TABLET BY MOUTH AT BEDTIME Steffanie Rainwater, MD Taking Active Self  oxyCODONE (OXY IR/ROXICODONE) 5 MG immediate release tablet [id number] No Take 1 tablet (5 mg total) by mouth every 6 (six) hours as needed for moderate pain (pain score 4-6). Magnant, Joycie Peek, PA-C Taking Active   Semaglutide, 2 MG/DOSE, 8 MG/3ML SOPN [id number] No Inject 2 mg into the skin once a week. Reymundo Poll, MD Taking Active Self  Med Note Gabriel Carina   Tue Sep 22, 2023  1:20 PM) Taking Saturdays as of 09/22/23 review.   senna-docusate (SENNA-TIME S) 8.6-50 MG tablet [id number] No Take 1 tablet by mouth at bedtime as needed for mild constipation. Briscoe Burns, MD Taking Active Self           Med Note Gunnar Fusi, MELISSA R   Thu Aug 27, 2023  1:05 PM) ONLY AS NEEDED   traZODone (DESYREL) 50 MG tablet [id number] No Take 1-2 tablets (50-100 mg total) by mouth at bedtime. Shanna Cisco, NP Taking Active   VENTOLIN HFA 108 (90 Base) MCG/ACT inhaler [id number] No Inhale 2 puffs into the lungs every 6 (six) hours as needed for wheezing or  shortness of breath. Faith Rogue, DO Taking Active Self           Patient Active Problem List   Diagnosis Date Noted   Biceps tendonitis on right 09/19/2023   OA (osteoarthritis) of shoulder 09/15/2023   S/P reverse total shoulder arthroplasty, right 09/15/2023   Chronic constipation 08/15/2023   History of iron deficiency 08/15/2023   Antiplatelet or antithrombotic long-term use 08/15/2023   Abdominal hernia 03/19/2023   Diabetic neuropathy (HCC) 08/29/2022   Vaginal lesion 08/29/2022   PTSD (post-traumatic stress disorder) 06/24/2022   GAD (generalized anxiety disorder) 06/24/2022   Major depressive disorder, recurrent episode, moderate (HCC) 06/24/2022   Urinary incontinence 06/20/2022   Intertrigo 06/20/2022   Arthritis of left shoulder region    S/P reverse total shoulder arthroplasty, 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   Healthcare maintenance 11/17/2021   Spondylolisthesis, lumbar region 02/11/2021    Class: Acute   Malignant neoplasm of upper-outer quadrant of right breast in female, estrogen receptor positive (HCC) 06/10/2018   Restless leg syndrome 02/04/2018   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   Depression 08/06/2015   Morbid obesity (HCC)    Hypertension associated with diabetes (HCC)    Aortic stenosis s/p TAVR    Chronic diastolic congestive heart failure (HCC)    COPD (chronic obstructive pulmonary disease) (HCC) 09/21/2013   Obstructive sleep apnea 09/21/2013   Nephrolithiasis 09/21/2013   Nocturnal hypoxemia 09/12/2013   Hyperlipidemia 11/30/2012   History of pulmonary embolus (PE) 07/09/2012   Normocytic anemia 02/08/2012   Type 2 diabetes mellitus without complication, without long-term current use of insulin (HCC) 02/08/2012   Asthma 02/08/2012   GERD (gastroesophageal reflux disease)    CAD (coronary artery disease) 12/08/2011   Conditions to be addressed/monitored per PCP order:  Chronic healthcare management needs, GERD, OSA, HF, CAD, HLD, RLS,  COPD, asthma, h/o breast cancer, sciatica, HTN, DM, neuropathy, anxiety/depression/PTSD  Care Plan : RN Care Manager Plan of Care  Updates made by [name], RN since 10/20/2023 12:00 AM     Problem: Health Promotion or Disease Self-Management (General Plan of Care)   Priority: High  Onset Date: 04/14/2022     Long-Range Goal: Chronic Disease Management and Care Coordination Needs   Start Date: 04/14/2022  Expected End Date: 01/20/2024  Recent Progress: Not on track  Priority: High  Note:   Current Barriers:  Knowledge Deficits related to plan of care for management of Anxiety and Depression along with other chronic health conditions, HTN, DM, HF, asthma, COPD, OSA, arthritis, GERD, CAD, HLD, h/o breast cancer, RLS Chronic Disease Management support and education needs related to Anxiety and Depression. DM, HTN, HF, asthma, OSA, arthritis, GERD, CAD, HLD, h/o breast cancer, RLS. 10/20/23:  Patient doing really well since right  shoulder surgery-has completed PT. Blood sugars stable and BP.  Continues therapy services, ORTHO f/u next week.    RNCM Clinical Goal(s):  Patient will verbalize understanding of plan for management of Anxiety and Depression, DM, HTN  as evidenced by patient report take all medications exactly as prescribed and will call provider for medication related questions as evidenced by patient report demonstrate understanding of rationale for each prescribed medication as evidenced by patient report attend all scheduled medical appointments  as evidenced by patient report continue to work with RN Care Manager to address care management and care coordination needs related to  Anxiety and Depression, DM, HTN as evidenced by adherence to CM Team Scheduled appointments work with Child psychotherapist to address  related to the management of Mental Health Concerns  related to the management of Anxiety and Depression as evidenced by review of EMR and patient or Child psychotherapist report  through collaboration with Medical illustrator, provider, and care team.   Interventions: Inter-disciplinary care team collaboration (see longitudinal plan of care) Evaluation of current treatment plan related to  self management and patient's adherence to plan as established by provider   (Status:  New goal.)  Long Term Goal Evaluation of current treatment plan related to Anxiety and Depression, Mental Health Concerns  self-management and patient's adherence to plan as established by provider. Discussed plans with patient for ongoing care management follow up and provided patient with direct contact information for care management team Advised patient to contact provider for any additional DME needs, CBG, and possible general surgeon referral. Reviewed medications with patient Collaborated with LCSW regarding anxiety/depression Reviewed scheduled/upcoming provider appointments Advised patient, providing education and rationale, to check cbg and record, calling provider  for findings outside established parameters  Social Work referral for anxiety/depression Discussed plans with patient for ongoing care management follow up and provided patient with direct contact information for care management team Assessed social determinant of health barriers Collaborated with Lubertha Sayres Managed Medicaid Liaison regarding DME-motorized wheelchair and lift chair. LCSW appointment rescheduled. Collaborated with Pharmacist. Pharmacy referral for polypharmacy at patient's request-completed  Asthma: (Status:New goal.) Long Term Goal Advised patient to track and manage Asthma triggers Advised patient to engage in light exercise as tolerated 3-5 days a week to aid in the the management of Asthma Provided education about and advised patient to utilize infection prevention strategies to reduce risk of respiratory infection Discussed the importance of adequate rest and management of fatigue with  Asthma Assessed social determinant of health barriers   CAD Interventions: (Status:  New goal.) Long Term Goal Assessed understanding of CAD diagnosis Medications reviewed including medications utilized in CAD treatment plan Provided education on importance of blood pressure control in management of CAD Provided education on Importance of limiting foods high in cholesterol Counseled on importance of regular laboratory monitoring as prescribed Counseled on the importance of exercise goals with target of 150 minutes per week Reviewed Importance of taking all medications as prescribed Reviewed Importance of attending all scheduled provider appointments Advised to report any changes in symptoms or exercise tolerance Assessed social determinant of health barriers  Heart Failure Interventions:  (Status:  New goal.) Long Term Goal Basic overview and discussion of pathophysiology of Heart Failure reviewed Provided education on low sodium diet Assessed need for readable accurate scales in home Provided education about placing scale on hard, flat surface Advised patient to weigh each morning after emptying bladder Discussed importance of daily weight and advised patient to weigh and record daily  Discussed the importance of keeping all appointments with provider Provided patient with education about the role of exercise in the management of heart failure Assessed social determinant of health barriers   COPD Interventions:  (Status:  New goal.) Long Term Goal Advised patient to track and manage COPD triggers Advised patient to self assesses COPD action plan zone and make appointment with provider if in the yellow zone for 48 hours without improvement Advised patient to engage in light exercise as tolerated 3-5 days a week to aid in the the management of COPD Provided education about and advised patient to utilize infection prevention strategies to reduce risk of respiratory infection Discussed the  importance of adequate rest and management of fatigue with COPD Assessed social determinant of health barriers  Hyperlipidemia Interventions:  (Status:  New goal.) Long Term Goal Medication review performed; medication list updated in electronic medical record.  Provider established cholesterol goals reviewed Counseled on importance of regular laboratory monitoring as prescribed Reviewed importance of limiting foods high in cholesterol Reviewed exercise goals and target of 150 minutes per week Assessed social determinant of health barriers    Diabetes Interventions:  (Status:  New goal.) Long Term Goal Assessed patient's understanding of A1c goal: <7% Reviewed medications with patient and discussed importance of medication adherence Reviewed scheduled/upcoming provider appointments  Advised patient, providing education and rationale, to check cbg and record, calling provider for findings outside established parameters Review of patient status, including review of consultants reports, relevant laboratory and other test results, and medications completed Assessed social determinant of health barriers Lab Results  Component Value Date   HGBA1C 7.1 (A) 08/27/2022   HGB A1C  7.3 on 06/03/23 HGBA1C   7.0 on 08/18/23 HGBA1C  7.1 on 09/09/23  Hypertension Interventions:  (Status:  New goal.) Long Term Goal Last practice recorded BP readings:  BP Readings from Last 3 Encounters:        08/18/23         153/74 10/20/23         114/74 06/03/23         127/59  Most recent eGFR/CrCl:  Lab Results  Component Value Date   EGFR 90 06/04/2022    No components found for: "CRCL"  Evaluation of current treatment plan related to hypertension self management and patient's adherence to plan as established by provider Reviewed medications with patient and discussed importance of compliance Discussed plans with patient for ongoing care management follow up and provided patient with direct contact  information for care management team Advised patient, providing education and rationale, to monitor blood pressure daily and record, calling PCP for findings outside established parameters Reviewed scheduled/upcoming provider appointments including:  Assessed social determinant of health barriers   Patient Goals/Self-Care Activities: Take all medications as prescribed Attend all scheduled provider appointments Call pharmacy for medication refills 3-7 days in advance of running out of medications Perform all self care activities independently  Perform IADL's (shopping, preparing meals, housekeeping, managing finances) independently Call provider office for new concerns or questions  Work with the social worker to address care coordination needs and will continue to work with the clinical team to address health care and disease management related needs Patient to f/u with provider regarding sciatica pain-completed  Follow Up Plan:  The patient has been provided with contact information for the care management team and has been advised to call with any health related questions or concerns.  The care management team will reach out to the patient again over  the next 60 business  days.    Long-Range Goal: Establish Plan of Care for Chronic Disease Management Needs   Priority: High  Note:   Timeframe:  Long-Range Goal Priority:  High Start Date:      04/14/22                       Expected End Date:     ongoing                  Follow Up Date: 12/04/23   - schedule appointment for flu shot - schedule appointment for vaccines needed due to my age or health - schedule recommended health tests (blood work, mammogram, colonoscopy, pap test) - schedule and keep appointment for annual check-up    Why is this important?   Screening tests can find diseases early when they are easier to treat.  Your doctor or nurse will talk with you about which tests are important for you.  Getting shots for  common diseases like the flu and shingles will help prevent them.  10/20/23:  Continues therapy appts, has completed PT, ORTHO f/u 10/30.   Follow Up:  Patient agrees to Care Plan and Follow-up.  Plan: The Managed Medicaid care management team will reach out to the patient again over the next 60 business  days. and The  Patient has been provided with contact information for the Managed Medicaid care management team and has been advised to call with any health related questions or concerns.  Date/time of next scheduled RN care management/care coordination outreach: 12/04/23 at 0900.

## 2023-10-26 ENCOUNTER — Other Ambulatory Visit: Payer: Self-pay

## 2023-10-26 DIAGNOSIS — E1159 Type 2 diabetes mellitus with other circulatory complications: Secondary | ICD-10-CM

## 2023-10-26 MED ORDER — METOPROLOL SUCCINATE ER 50 MG PO TB24
50.0000 mg | ORAL_TABLET | Freq: Every day | ORAL | 2 refills | Status: DC
Start: 2023-10-26 — End: 2024-08-01

## 2023-10-28 ENCOUNTER — Encounter: Payer: Medicaid Other | Admitting: Orthopedic Surgery

## 2023-10-30 DIAGNOSIS — Z419 Encounter for procedure for purposes other than remedying health state, unspecified: Secondary | ICD-10-CM | POA: Diagnosis not present

## 2023-11-02 ENCOUNTER — Telehealth: Payer: Self-pay | Admitting: Cardiovascular Disease

## 2023-11-02 DIAGNOSIS — I2510 Atherosclerotic heart disease of native coronary artery without angina pectoris: Secondary | ICD-10-CM

## 2023-11-02 DIAGNOSIS — I350 Nonrheumatic aortic (valve) stenosis: Secondary | ICD-10-CM

## 2023-11-02 MED ORDER — CLOPIDOGREL BISULFATE 75 MG PO TABS: 75.0000 mg | ORAL_TABLET | Freq: Every morning | ORAL | 0 refills | Status: DC

## 2023-11-02 NOTE — Telephone Encounter (Signed)
*  STAT* If patient is at the pharmacy, call can be transferred to refill team.   1. Which medications need to be refilled? (please list name of each medication and dose if known) clopidogrel (PLAVIX) 75 MG tablet   2. Would you like to learn more about the convenience, safety, & potential cost savings by using the [facility]?    3. Are you open to using the [facility] (Type [facility].  ).  4. Which pharmacy/location (including street and city if local pharmacy) is medication to be sent to?  [facility] - [place], [place] - [address]    5. Do they need a 30 day or 90 day supply? 90 day  Patient states her PCP who normally wrote this script is no longer there and now needs Dr. Clifton James to prescribe it to her.

## 2023-11-02 NOTE — Telephone Encounter (Signed)
Pt's medication was sent to pt's pharmacy as requested. Confirmation received.  °

## 2023-11-09 ENCOUNTER — Telehealth: Payer: Self-pay | Admitting: Student

## 2023-11-09 DIAGNOSIS — J441 Chronic obstructive pulmonary disease with (acute) exacerbation: Secondary | ICD-10-CM

## 2023-11-09 MED ORDER — TRELEGY ELLIPTA 100-62.5-25 MCG/ACT IN AEPB: INHALATION_SPRAY | RESPIRATORY_TRACT | 0 refills | Status: DC

## 2023-11-09 NOTE — Telephone Encounter (Signed)
Pt state she contacted her pharmacy on Saturday for the following refill request  Fluticasone-Umeclidin-Vilant (TRELEGY ELLIPTA) 100-62.5-25 MCG/ACT AEPB    [facility] - [place], [place] - [address]

## 2023-11-09 NOTE — Telephone Encounter (Signed)
I called pt to schedule he next appt - call transferred to front office.Next appt scheduled 12/3 with Dr Versie Starks

## 2023-11-10 ENCOUNTER — Telehealth: Payer: Self-pay | Admitting: *Deleted

## 2023-11-10 NOTE — Telephone Encounter (Signed)
Spoke with patient to follow up on he PCS request/ patient states she never heard anything but don't need the service now that she has gotten better since her surgery. Mainly needed someone after the surgery.  So at present do not need any help.

## 2023-11-11 ENCOUNTER — Encounter: Payer: Self-pay | Admitting: Surgical

## 2023-11-11 ENCOUNTER — Ambulatory Visit (INDEPENDENT_AMBULATORY_CARE_PROVIDER_SITE_OTHER): Payer: Medicaid Other | Admitting: Surgical

## 2023-11-11 DIAGNOSIS — Z96611 Presence of right artificial shoulder joint: Secondary | ICD-10-CM | POA: Diagnosis not present

## 2023-11-11 NOTE — Progress Notes (Signed)
Post-Op Visit Note   Patient: [name]           Date of Birth: 1961-02-26           MRN: [id number] Visit Date: 11/11/2023 PCP: Philomena Doheny, MD   Assessment & Plan:  Chief Complaint:  Chief Complaint  Patient presents with   Right Shoulder - Follow-up    Reverse shoulder arthroplasty 09/15/2023   Visit Diagnoses: No diagnosis found.  Plan: [name] is a 62 y.o. female who presents s/p right reverse shoulder arthroplasty on 09/15/2023.  Patient is doing well and pain is overall controlled.  Has finished formal home health physical therapy and is just doing home exercise program.  She only notes some soreness with reaching behind her and some hypersensitivity over her incision but otherwise she feels her shoulder is doing well.  She can lay on the right side for short period of time at night.  She sleeps through the night as long as she lays on her back or her left side.  On exam, patient has range of motion 50 degrees X rotation, 90 degrees abduction, 160 degrees forward elevation passively and actively.  Intact EPL, FPL, finger abduction, finger adduction, pronation/supination, bicep, tricep, deltoid of operative extremity.  Axillary nerve intact with deltoid firing.  Incision is well-healed.  Excellent subscap strength rated 5/5.  Plan is okay to progress to lifting 5 to 10 pounds.  She will slowly increase her lifting over the next couple months but no lifting with this arm or than 25 pounds long-term.  She understands this.  She also understands the concept of dental antibiotic prophylaxis which she already adheres to with her left shoulder.  She will follow-up with the office as needed and based on her range of motion and strength and overall function at less than 2 months out she is doing excellent.  Follow-Up Instructions: No follow-ups on file.   Orders:  No orders of the defined types were placed in this encounter.  No orders of the defined  types were placed in this encounter.   Imaging: No results found.  PMFS History: Patient Active Problem List   Diagnosis Date Noted   Biceps tendonitis on right 09/19/2023   OA (osteoarthritis) of shoulder 09/15/2023   S/P reverse total shoulder arthroplasty, right 09/15/2023   Chronic constipation 08/15/2023   History of iron deficiency 08/15/2023   Antiplatelet or antithrombotic long-term use 08/15/2023   Abdominal hernia 03/19/2023   Diabetic neuropathy (HCC) 08/29/2022   Vaginal lesion 08/29/2022   PTSD (post-traumatic stress disorder) 06/24/2022   GAD (generalized anxiety disorder) 06/24/2022   Major depressive disorder, recurrent episode, moderate (HCC) 06/24/2022   Urinary incontinence 06/20/2022   Intertrigo 06/20/2022   Arthritis of left shoulder region    S/P reverse total shoulder arthroplasty, 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   Healthcare maintenance 11/17/2021   Spondylolisthesis, lumbar region 02/11/2021    Class: Acute   Malignant neoplasm of upper-outer quadrant of right breast in female, estrogen receptor positive (HCC) 06/10/2018   Restless leg syndrome 02/04/2018   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   Depression 08/06/2015   Morbid obesity (HCC)    Hypertension associated with diabetes (HCC)    Aortic stenosis s/p TAVR    Chronic diastolic congestive heart failure (HCC)    COPD (chronic obstructive pulmonary disease) (HCC) 09/21/2013   Obstructive sleep apnea 09/21/2013   Nephrolithiasis 09/21/2013   Nocturnal hypoxemia 09/12/2013   Hyperlipidemia 11/30/2012   History of pulmonary embolus (  PE) 07/09/2012   Normocytic anemia 02/08/2012   Type 2 diabetes mellitus without complication, without long-term current use of insulin (HCC) 02/08/2012   Asthma 02/08/2012   GERD (gastroesophageal reflux disease)    CAD (coronary artery disease) 12/08/2011   Past Medical History:  Diagnosis Date   Anemia    Anxiety    Aortic valve stenosis, severe     Arthritis    PAIN AND SEVERE OA LEFT KNEE ; S/P RIGHT TKA ON 02/03/12; HAS LOWER BACK PAIN-UNABLE TO STAND MORE THAN 10 MIN; ARTHRITIS "ALL OVER"   Asthma    Blood transfusion    2013[facility]   Breast cancer in female [facility])    Right   CAD (coronary artery disease)    Cath 2010 with DES x 1 RCA-- PT'S CARDIOLOGIST IS DR. MCALHANY   Chronic diastolic congestive heart failure (HCC)    COPD (chronic obstructive pulmonary disease) (HCC)    Mild   COPD exacerbation (HCC) 11/16/2022   Depression    Diabetes mellitus DIAGNOSED IN2010   Dyspnea    with much ambulation   Eczema    on back   Headache    migraines younger- rare now 02/07/21   Heart murmur    no current problems   History of hiatal hernia    History of kidney stones    passed or blasted   History of UTI 12/05/2022   Hyperlipidemia    Hypertension    Morbid obesity with body mass index of 60.0-69.9 in adult [facility])    Myocardial infarction (HCC)    PT THINKS SHE WAS DX WITH MI AT THE TIME OF HEART STENTING   Neuromuscular disorder (HCC)    bilateral arm/hands   Oxygen dependent    uses 3L oxygen night/prn   Personal history of radiation therapy    Pneumonia    Pulmonary embolism (HCC) 02/08/2012   S/P RT TOTAL KNEE ON 02/03/12--ON 02/08/12--DEVELOPED ACUTE SOB AND CHEST PAIN--AND DIAGNOSED WITH  PULMONARY EMBOLUS AND PNEUMONIA   Restless leg syndrome    Sleep apnea    Uterine fibroid    NO PROBLEMS AT PRESENT FROM THE FIBROIDS-STATES SHE IS POST MENOPAUSAL-LAST MENSES 2010 EXCEPT FOR EPISODE THIS YR OF BLEEDING RELATED TO FIBROIDS.   Weakness    BOTH HANDS - S/P BILATERAL CARPAL TUNNEL RELEASE--BUT STILL HAS WEAKNESS--OFTEN DROPS THINGS    Family History  Problem Relation Age of Onset   Breast cancer Mother        stage IV at diagnosis   Emphysema Mother        smoked   Heart disease Mother    COPD Father        smoked   Asthma Father    Heart disease Father    Cancer Brother        Sinus   Colon cancer Neg Hx     Esophageal cancer Neg Hx    Inflammatory bowel disease Neg Hx    Liver disease Neg Hx    Pancreatic cancer Neg Hx    Rectal cancer Neg Hx    Stomach cancer Neg Hx     Past Surgical History:  Procedure Laterality Date   BACK SURGERY  02/11/2021   Dr Otelia Sergeant   BICEPT TENODESIS Right 09/15/2023   Procedure: BICEPS TENODESIS;  Surgeon: Cammy Copa, MD;  Location: [facility] OR;  Service: Orthopedics;  Laterality: Right;   BREAST BIOPSY Right 06/04/2018   BREAST LUMPECTOMY Right 06/2018   BREAST LUMPECTOMY WITH RADIOACTIVE  SEED AND SENTINEL LYMPH NODE BIOPSY Right 07/19/2018   Procedure: BREAST LUMPECTOMY WITH RADIOACTIVE SEED AND SENTINEL LYMPH NODE BIOPSY;  Surgeon: Ovidio Kin, MD;  Location: [facility] OR;  Service: General;  Laterality: Right;   CARPAL TUNNEL RELEASE     Bilateral   CHOLECYSTECTOMY     COLONOSCOPY  2010   CORONARY ANGIOPLASTY     2010 has stent in place   CYSTOSCOPY W/ RETROGRADES Right 09/21/2013   Procedure: CYSTOSCOPY WITH RIGHT RETROGRADE PYELOGRAM RIGHT DOUBLE J STENT ;  Surgeon: Antony Haste, MD;  Location: [facility] ORS;  Service: Urology;  Laterality: Right;   CYSTOSCOPY W/ URETERAL STENT PLACEMENT Right 11/16/2022   Procedure: CYSTOSCOPY WITH RETROGRADE PYELOGRAM/URETERAL STENT PLACEMENT;  Surgeon: Despina Arias, MD;  Location: [facility] OR;  Service: Urology;  Laterality: Right;   CYSTOSCOPY WITH RETROGRADE PYELOGRAM, URETEROSCOPY AND STENT PLACEMENT Right 01/13/2023   Procedure: CYSTOSCOPY WITH RETROGRADE PYELOGRAM, URETEROSCOPY AND STENT EXCHANGE;  Surgeon: Milderd Meager., MD;  Location: [facility] ORS;  Service: Urology;  Laterality: Right;   CYSTOSCOPY WITH URETEROSCOPY AND STENT PLACEMENT Right 10/25/2013   Procedure: CYSTOSCOPY RIGHT URETEROSCOPY HOLMIUM LASER LITHO AND STENT PLACEMENT;  Surgeon: Antony Haste, MD;  Location: [facility] ORS;  Service: Urology;  Laterality: Right;   HERNIA REPAIR  1995   umbilical ,   HOLMIUM LASER APPLICATION Right 01/13/2023    Procedure: HOLMIUM LASER APPLICATION;  Surgeon: Milderd Meager., MD;  Location: [facility] ORS;  Service: Urology;  Laterality: Right;   INTRAOPERATIVE TRANSESOPHAGEAL ECHOCARDIOGRAM N/A 12/12/2014   Procedure: INTRAOPERATIVE TRANSESOPHAGEAL ECHOCARDIOGRAM;  Surgeon: Kathleene Hazel, MD;  Location[facility] OR;  Service: Cardiovascular;  Laterality: N/A;   KNEE ARTHROPLASTY  02/03/2012   Procedure: COMPUTER ASSISTED TOTAL KNEE ARTHROPLASTY;  Surgeon: [name], MD;  Location: [facility] OR;  Service: Orthopedics;  Laterality: Right;  Right total knee arthroplasty   LEFT AND RIGHT HEART CATHETERIZATION WITH CORONARY ANGIOGRAM N/A 03/17/2013   Procedure: LEFT AND RIGHT HEART CATHETERIZATION WITH CORONARY ANGIOGRAM;  Surgeon: Kathleene Hazel, MD;  Location: [facility] CATH LAB;  Service: Cardiovascular;  Laterality: N/A;   LEFT AND RIGHT HEART CATHETERIZATION WITH CORONARY/GRAFT ANGIOGRAM N/A 09/14/2014   Procedure: LEFT AND RIGHT HEART CATHETERIZATION WITH Isabel Caprice;  Surgeon: Kathleene Hazel, MD;  Location: [facility];  Service: Cardiovascular;  Laterality: N/A;   REVERSE SHOULDER ARTHROPLASTY Left 03/06/2022   Procedure: LEFT SHOULDER REPLACEMENT APPLICATION OF WOUND VAC;  Surgeon: Cammy Copa, MD;  Location: [facility] OR;  Service: Orthopedics;  Laterality: Left;   REVERSE SHOULDER ARTHROPLASTY Right 09/15/2023   Procedure: REVERSE SHOULDER ARTHROPLASTY;  Surgeon: Cammy Copa, MD;  Location: [facility] OR;  Service: Orthopedics;  Laterality: Right;   TEE WITHOUT CARDIOVERSION N/A 03/14/2013   Procedure: TRANSESOPHAGEAL ECHOCARDIOGRAM (TEE);  Surgeon: Lewayne Bunting, MD;  Location: [facility] ENDOSCOPY;  Service: Cardiovascular;  Laterality: N/A;   TEE WITHOUT CARDIOVERSION N/A 11/14/2014   Procedure: TRANSESOPHAGEAL ECHOCARDIOGRAM (TEE);  Surgeon: Lewayne Bunting, MD;  Location: [facility] ENDOSCOPY;  Service: Cardiovascular;  Laterality: N/A;   TONSILLECTOMY     maybe as a child-  does not know   TOTAL KNEE ARTHROPLASTY  09/10/2012   Procedure: TOTAL KNEE ARTHROPLASTY;  Surgeon: [name], MD;  Location: [facility]S;  Service: Orthopedics;  Laterality: Left;   TOTAL KNEE REVISION Right 07/15/2013   Procedure: REVISION ARTHROPLASTY RIGHT KNEE;  Surgeon: [name], MD;  Location: [facility] ORS;  Service: Orthopedics;  Laterality: Right;   TRANSCATHETER AORTIC VALVE REPLACEMENT,  TRANSFEMORAL N/A 12/12/2014   Procedure: TRANSCATHETER AORTIC VALVE REPLACEMENT, TRANSFEMORAL;  Surgeon: Kathleene Hazel, MD;  Location: [facility] OR;  Service: Cardiovascular;  Laterality: N/A;   TRIGGER FINGER RELEASE  09/10/2012   Procedure: RELEASE TRIGGER FINGER/A-1 PULLEY;  Surgeon: [name], MD;  Location: [facility] ORS;  Service: Orthopedics;  Laterality: Right;  Right Ring Finger   TUBAL LIGATION     Social History   Occupational History   Occupation: Disabled  Tobacco Use   Smoking status: Former    Current packs/day: 0.00    Average packs/day: 1.5 packs/day for 30.0 years (45.0 ttl pk-yrs)    Types: Cigarettes    Start date: 12/29/1970    Quit date: 12/29/2000    Years since quitting: 22.8   Smokeless tobacco: Never  Vaping Use   Vaping status: Never Used  Substance and Sexual Activity   Alcohol use: Not Currently   Drug use: Not Currently    Types: Marijuana    Comment: CBD oils through vape shops   Sexual activity: Not Currently    Birth control/protection: Surgical, Post-menopausal    Comment: tubal ligation

## 2023-11-29 DIAGNOSIS — Z419 Encounter for procedure for purposes other than remedying health state, unspecified: Secondary | ICD-10-CM | POA: Diagnosis not present

## 2023-11-30 ENCOUNTER — Other Ambulatory Visit: Payer: Self-pay

## 2023-11-30 DIAGNOSIS — E1142 Type 2 diabetes mellitus with diabetic polyneuropathy: Secondary | ICD-10-CM

## 2023-11-30 MED ORDER — GABAPENTIN 300 MG PO CAPS
ORAL_CAPSULE | ORAL | 3 refills | Status: DC
Start: 2023-11-30 — End: 2024-04-11

## 2023-12-01 ENCOUNTER — Encounter: Payer: Self-pay | Admitting: Student

## 2023-12-01 ENCOUNTER — Ambulatory Visit: Payer: Medicaid Other | Admitting: Student

## 2023-12-01 VITALS — BP 124/63 | HR 85 | Temp 98.0°F | Wt 287.3 lb

## 2023-12-01 DIAGNOSIS — E119 Type 2 diabetes mellitus without complications: Secondary | ICD-10-CM

## 2023-12-01 DIAGNOSIS — I152 Hypertension secondary to endocrine disorders: Secondary | ICD-10-CM | POA: Diagnosis not present

## 2023-12-01 DIAGNOSIS — Z7984 Long term (current) use of oral hypoglycemic drugs: Secondary | ICD-10-CM | POA: Diagnosis not present

## 2023-12-01 DIAGNOSIS — E1159 Type 2 diabetes mellitus with other circulatory complications: Secondary | ICD-10-CM | POA: Diagnosis not present

## 2023-12-01 LAB — POCT GLYCOSYLATED HEMOGLOBIN (HGB A1C): Hemoglobin A1C: 6.6 % — AB (ref 4.0–5.6)

## 2023-12-01 LAB — GLUCOSE, CAPILLARY: Glucose-Capillary: 124 mg/dL — ABNORMAL HIGH (ref 70–99)

## 2023-12-01 MED ORDER — WEGOVY 2.4 MG/0.75ML ~~LOC~~ SOAJ: 2.4000 mg | SUBCUTANEOUS | 1 refills | Status: DC

## 2023-12-01 MED ORDER — EMPAGLIFLOZIN 10 MG PO TABS
10.0000 mg | ORAL_TABLET | Freq: Every day | ORAL | 2 refills | Status: DC
Start: 2023-12-01 — End: 2024-01-29

## 2023-12-01 NOTE — Progress Notes (Signed)
CC: diabetes, blood pressure follow up  HPI:  [name] is a 62 y.o. female living with a history stated below and presents today for diabetes, blood pressure follow up. Please see problem based assessment and plan for additional details.  Past Medical History:  Diagnosis Date   Anemia    Anxiety    Aortic valve stenosis, severe    Arthritis    PAIN AND SEVERE OA LEFT KNEE ; S/P RIGHT TKA ON 02/03/12; HAS LOWER BACK PAIN-UNABLE TO STAND MORE THAN 10 MIN; ARTHRITIS "ALL OVER"   Asthma    Blood transfusion    2013[facility]   Breast cancer in female [facility])    Right   CAD (coronary artery disease)    Cath 2010 with DES x 1 RCA-- PT'S CARDIOLOGIST IS DR. MCALHANY   Chronic diastolic congestive heart failure (HCC)    COPD (chronic obstructive pulmonary disease) (HCC)    Mild   COPD exacerbation (HCC) 11/16/2022   Depression    Diabetes mellitus DIAGNOSED IN2010   Dyspnea    with much ambulation   Eczema    on back   Headache    migraines younger- rare now 02/07/21   Heart murmur    no current problems   History of hiatal hernia    History of kidney stones    passed or blasted   History of UTI 12/05/2022   Hyperlipidemia    Hypertension    Morbid obesity with body mass index of 60.0-69.9 in adult [facility])    Myocardial infarction (HCC)    PT THINKS SHE WAS DX WITH MI AT THE TIME OF HEART STENTING   Neuromuscular disorder (HCC)    bilateral arm/hands   Oxygen dependent    uses 3L oxygen night/prn   Personal history of radiation therapy    Pneumonia    Pulmonary embolism (HCC) 02/08/2012   S/P RT TOTAL KNEE ON 02/03/12--ON 02/08/12--DEVELOPED ACUTE SOB AND CHEST PAIN--AND DIAGNOSED WITH  PULMONARY EMBOLUS AND PNEUMONIA   Restless leg syndrome    Sleep apnea    Uterine fibroid    NO PROBLEMS AT PRESENT FROM THE FIBROIDS-STATES SHE IS POST MENOPAUSAL-LAST MENSES 2010 EXCEPT FOR EPISODE THIS YR OF BLEEDING RELATED TO FIBROIDS.   Weakness    BOTH HANDS - S/P BILATERAL  CARPAL TUNNEL RELEASE--BUT STILL HAS WEAKNESS--OFTEN DROPS THINGS    Current Outpatient Medications on File Prior to Visit  Medication Sig Dispense Refill   acetaminophen (TYLENOL) 500 MG tablet Take 1,000 mg by mouth every 6 (six) hours as needed for moderate pain.     amLODipine (NORVASC) 5 MG tablet Take 1 tablet (5 mg total) by mouth daily. 90 tablet 2   aspirin EC 81 MG tablet Take 1 tablet (81 mg total) by mouth daily. Swallow whole. 30 tablet 12   atorvastatin (LIPITOR) 80 MG tablet Take 1 tablet (80 mg total) by mouth daily. 90 tablet 2   Blood Pressure Monitoring (BLOOD PRESSURE CUFF) MISC Please follow insert instructions. 1 each 0   busPIRone (BUSPAR) 15 MG tablet Take 1 tablet (15 mg total) by mouth 2 (two) times daily. 60 tablet 3   cholecalciferol (VITAMIN D3) 25 MCG (1000 UNIT) tablet Take 1,000 Units by mouth daily.     clopidogrel (PLAVIX) 75 MG tablet Take 1 tablet (75 mg total) by mouth every morning. 90 tablet 0   Continuous Glucose Receiver (DEXCOM G7 RECEIVER) DEVI 1 Units by Does not apply route daily. 1 each 0  Continuous Glucose Sensor (DEXCOM G7 SENSOR) MISC Place new sensor every 10 days. Use to monitor blood sugar continuously. 9 each 3   Continuous Glucose Sensor (DEXCOM G7 SENSOR) MISC 1 Units by Does not apply route daily. 1 each 0   Continuous Glucose Sensor (FREESTYLE LIBRE 3 SENSOR) MISC Place 1 sensor on the skin every 14 days. Use to check glucose continuously 2 each 11   DULoxetine (CYMBALTA) 20 MG capsule Take 1 capsule (20 mg total) by mouth daily. 30 capsule 3   Evolocumab (REPATHA SURECLICK) 140 MG/ML SOAJ Inject 140 mg into the skin every 14 (fourteen) days. 6 mL 3   exemestane (AROMASIN) 25 MG tablet TAKE 1 TABLET BY MOUTH ONCE DAILY AFTER BREAKFAST 90 tablet 3   ezetimibe (ZETIA) 10 MG tablet Take 1 tablet by mouth once daily (Patient taking differently: Take 10 mg by mouth at bedtime.) 90 tablet 2   Fluticasone-Umeclidin-Vilant (TRELEGY ELLIPTA)  100-62.5-25 MCG/ACT AEPB INHALE 1 PUFF ONCE DAILY 180 each 0   furosemide (LASIX) 40 MG tablet Take 1 tablet (40 mg total) by mouth as needed for fluid or edema. 180 tablet 1   gabapentin (NEURONTIN) 300 MG capsule TAKE 1 CAPSULE BY MOUTH IN THE MORNING AND 3 AT BEDTIME 120 capsule 3   hydrocortisone cream 1 % Apply 1 Application topically daily as needed for itching.     losartan-hydrochlorothiazide (HYZAAR) 100-25 MG tablet Take 1 tablet by mouth daily. Please repeat kidney labs to get additional refills 30 tablet 2   metFORMIN (GLUCOPHAGE-XR) 500 MG 24 hr tablet Take 1 tablet (500 mg total) by mouth 2 (two) times daily. 180 tablet 0   methocarbamol (ROBAXIN) 500 MG tablet Take 1 tablet (500 mg total) by mouth every 8 (eight) hours as needed for muscle spasms. 30 tablet 0   metoprolol succinate (TOPROL-XL) 50 MG 24 hr tablet Take 1 tablet (50 mg total) by mouth daily. Take with or immediately following a meal.TAKE 1 TABLET BY MOUTH ONCE DAILY WITH MEALS OR  IMMEDIATELY  FOLLOWING  A  MEAL 90 tablet 2   montelukast (SINGULAIR) 10 MG tablet TAKE 1 TABLET BY MOUTH AT BEDTIME 90 tablet 1   oxyCODONE (OXY IR/ROXICODONE) 5 MG immediate release tablet Take 1 tablet (5 mg total) by mouth every 6 (six) hours as needed for moderate pain (pain score 4-6). 30 tablet 0   senna-docusate (SENNA-TIME S) 8.6-50 MG tablet Take 1 tablet by mouth at bedtime as needed for mild constipation. 30 tablet 1   traZODone (DESYREL) 50 MG tablet Take 1-2 tablets (50-100 mg total) by mouth at bedtime. 30 tablet 3   VENTOLIN HFA 108 (90 Base) MCG/ACT inhaler Inhale 2 puffs into the lungs every 6 (six) hours as needed for wheezing or shortness of breath. 18 g 3   [DISCONTINUED] Fluticasone-Salmeterol (ADVAIR DISKUS) 100-50 MCG/DOSE AEPB Inhale 1 puff into the lungs daily. 360 each 0   No current facility-administered medications on file prior to visit.   Family History  Problem Relation Age of Onset   Breast cancer Mother         stage IV at diagnosis   Emphysema Mother        smoked   Heart disease Mother    COPD Father        smoked   Asthma Father    Heart disease Father    Cancer Brother        Sinus   Colon cancer Neg Hx    Esophageal cancer  Neg Hx    Inflammatory bowel disease Neg Hx    Liver disease Neg Hx    Pancreatic cancer Neg Hx    Rectal cancer Neg Hx    Stomach cancer Neg Hx    Social History   Socioeconomic History   Marital status: Married    Spouse name: Not on file   Number of children: 2   Years of education: Not on file   Highest education level: Not on file  Occupational History   Occupation: Disabled  Tobacco Use   Smoking status: Former    Current packs/day: 0.00    Average packs/day: 1.5 packs/day for 30.0 years (45.0 ttl pk-yrs)    Types: Cigarettes    Start date: 12/29/1970    Quit date: 12/29/2000    Years since quitting: 22.9   Smokeless tobacco: Never  Vaping Use   Vaping status: Never Used  Substance and Sexual Activity   Alcohol use: Not Currently   Drug use: Not Currently    Types: Marijuana    Comment: CBD oils through vape shops   Sexual activity: Not Currently    Birth control/protection: Surgical, Post-menopausal    Comment: tubal ligation  Other Topics Concern   Not on file  Social History Narrative   Not on file   Social Determinants of Health   Financial Resource Strain: Low Risk  (10/20/2023)   Overall Financial Resource Strain (CARDIA)    Difficulty of Paying Living Expenses: Not very hard  Food Insecurity: No Food Insecurity (06/16/2023)   Hunger Vital Sign    Worried About Running Out of Food in the Last Year: Never true    Ran Out of Food in the Last Year: Never true  Transportation Needs: No Transportation Needs (09/21/2023)   PRAPARE - Administrator, Civil Service (Medical): No    Lack of Transportation (Non-Medical): No  Physical Activity: Inactive (07/21/2023)   Exercise Vital Sign    Days of Exercise per Week: 0 days     Minutes of Exercise per Session: 0 min  Stress: No Stress Concern Present (09/07/2023)   [employer] of Occupational Health - Occupational Stress Questionnaire    Feeling of Stress : Not at all  Social Connections: Moderately Isolated (10/20/2023)   Social Connection and Isolation Panel [NHANES]    Frequency of Communication with Friends and Family: More than three times a week    Frequency of Social Gatherings with Friends and Family: More than three times a week    Attends Religious Services: Never    Database administrator or Organizations: No    Attends Banker Meetings: Never    Marital Status: Married  Catering manager Violence: Not At Risk (08/21/2023)   Humiliation, Afraid, Rape, and Kick questionnaire    Fear of Current or Ex-Partner: No    Emotionally Abused: No    Physically Abused: No    Sexually Abused: No   Review of Systems: ROS negative except for what is noted on the assessment and plan.  Vitals:   12/01/23 1418  BP: 124/63  Pulse: 85  Temp: 98 F (36.7 C)  TempSrc: Oral  SpO2: 95%  Weight: 287 lb 4.8 oz (130.3 kg)   Physical Exam: Constitutional: well appearing, sitting in an electric wheel chair Cardiovascular: regular rate and rhythm, no m/r/g; 1+ LE edema on the left, chronic Pulmonary/Chest: CTA bilaterally, normal respiratory effort; cough with deep breathing, no wheezes or crackles Neurological: alert & oriented x 3,  no focal deficits Skin: warm and dry  Assessment & Plan:   Patient seen with Dr. Sol Blazing  Hypertension associated with diabetes (HCC) Blood pressure is 124/63 today. She is currently taking amlodipine 5 mg daily, metoprolol succinate 50 mg daily, and losartan-hydrochlorothiazide 100-25 mg daily with no concerns. She did take these medications today. She has been checking her blood pressure at home and says it usually runs around 130s systolic. She does note some increased dizziness recently, so we have checked orthostatics  today, which were negative. Since her blood pressure is well-controlled, we will continue her current medication regimen as listed above. I suspect with increased weight loss, her blood pressure will continue to lower, and we may be able to back off some of these medications.   Type 2 diabetes mellitus without complication, without long-term current use of insulin (HCC) Hemoglobin A1c today is 6.6%, down from 7.1% in August. She is currently taking Ozempic 2 mg weekly and metformin 500 mg BID. She states she has diarrhea, but it is manageable. It may be due to either the metformin or ozempic. She denies any other side effects including early satiety, nausea/vomiting, or abdominal pain. Interrogated her CGM, which showed her blood glucose at target 93% of the time with no significant highs or lows.   Since she is having diarrhea, trying to lose weight, and has heart failure, I am starting her on Jardiance 10 mg and will plan to discontinue Metformin. I am also switching her to [facility] 2.4 mg weekly for weight management in addition to glycemic control. She is going to finish her remaining doses of ozempic before switching. Her weight is 287 lbs 4.8 oz, down from 300 lbs 1.6 oz in August. She is also taking Gabapentin 300 mg (3 at night, 1 in the morning) for diabetic neuropathy, which she feels has been helping, so we will continue this medication.   Annett Fabian, MD  [facility] Internal Medicine, PGY-1 Phone: [phone] Date 12/01/2023 Time 5:17 PM

## 2023-12-01 NOTE — Assessment & Plan Note (Addendum)
Hemoglobin A1c today is 6.6%, down from 7.1% in August. She is currently taking Ozempic 2 mg weekly and metformin 500 mg BID. She states she has diarrhea, but it is manageable. It may be due to either the metformin or ozempic. She denies any other side effects including early satiety, nausea/vomiting, or abdominal pain. Interrogated her CGM, which showed her blood glucose at target 93% of the time with no significant highs or lows.   Since she is having diarrhea, trying to lose weight, and has heart failure, I am starting her on Jardiance 10 mg and will plan to discontinue Metformin. I am also switching her to [facility] 2.4 mg weekly for weight management in addition to glycemic control. She is going to finish her remaining doses of ozempic before switching. Her weight is 287 lbs 4.8 oz, down from 300 lbs 1.6 oz in August. She is also taking Gabapentin 300 mg (3 at night, 1 in the morning) for diabetic neuropathy, which she feels has been helping, so we will continue this medication.

## 2023-12-01 NOTE — Assessment & Plan Note (Addendum)
Blood pressure is 124/63 today. She is currently taking amlodipine 5 mg daily, metoprolol succinate 50 mg daily, and losartan-hydrochlorothiazide 100-25 mg daily with no concerns. She did take these medications today. She has been checking her blood pressure at home and says it usually runs around 130s systolic. She does note some increased dizziness recently, so we have checked orthostatics today, which were negative. Since her blood pressure is well-controlled, we will continue her current medication regimen as listed above. I suspect with increased weight loss, her blood pressure will continue to lower, and we may be able to back off some of these medications.

## 2023-12-01 NOTE — Patient Instructions (Addendum)
Thank you, [name] for allowing [place] to provide your care today. Today we discussed your hypertension and diabetes.  As we discussed, your hemoglobin A1c was 6.6 today.  Keep up the good work.  We are adding Jardiance 10 mg daily for your diabetes.  As soon as this is approved, we will stop the metformin, and hopefully that relieves some of your diarrhea.  For your weight loss, I have sent in a prescription for Wegovy 2.4 mg weekly.  You can continue using your Ozempic until it runs out, then switch over to [facility].  For your blood pressure, we will not make any changes to your medication regimen at this time.  We checked your orthostatic vital signs, which were negative. I recommend continuing to drink plenty of fluids to ensure you are adequately hydrated. If you continue to experience dizziness, or the dizziness worsens, please give [place] a call and let us know.  For your cough, I recommend using Flonase as instructed during our visit.  I suspect that your cough may be due to postnasal drip, and you should see improvement within a week or so if this is true.  I have ordered the following labs for you:  Lab Orders         Glucose, capillary         POC Hbg A1C      Referrals ordered today:   Referral Orders  No referral(s) requested today    I have ordered the following medication/changed the following medications:   Stop the following medications: Medications Discontinued During This Encounter  Medication Reason   Semaglutide, 2 MG/DOSE, 8 MG/3ML SOPN Change in therapy     Start the following medications: Meds ordered this encounter  Medications   Semaglutide-Weight Management (WEGOVY) 2.4 MG/0.75ML SOAJ    Sig: Inject 2.4 mg into the skin once a week.    Dispense:  3 mL    Refill:  1   empagliflozin (JARDIANCE) 10 MG TABS tablet    Sig: Take 1 tablet (10 mg total) by mouth daily before breakfast.    Dispense:  30 tablet    Refill:  2     Follow up: 2-3  months  We look forward to seeing you next time. Please call our clinic at [phone] if you have any questions or concerns. The best time to call is Monday-Friday from 9am-4pm, but there is someone available 24/7. If after hours or the weekend, call the main hospital number and ask for the Internal Medicine Resident On-Call. If you need medication refills, please notify your pharmacy one week in advance and they will send [place] a request.   Thank you for trusting me with your care. Wishing you the best!   Annett Fabian, MD [facility]

## 2023-12-02 ENCOUNTER — Telehealth: Payer: Self-pay

## 2023-12-02 NOTE — Telephone Encounter (Signed)
Decision:Approved Park Pope (Key: [id number]) PA Case ID #: [id number] Rx #: [id number] Need Help? Call us at [phone] Outcome Approved today by [facility] Medicaid 2017 Approved. This drug has been approved. Approved quantity: 30 units per 30 day(s). The drug has been approved from 11/18/2023 to 12/01/2024. Please call the pharmacy to process your prescription claim. Generic or biosimilar substitution may be required when available and preferred on the formulary. Authorization Expiration Date: 12/01/2024 Drug Jardiance 10MG  tablets ePA cloud logo Form [facility] Medicaid of [facility] Electronic Prior Authorization Request Form [phone] NCPDP) Original Claim Info 61

## 2023-12-02 NOTE — Telephone Encounter (Signed)
Prior Authorization for patient [name] 2.4MG /0.75ML auto-injectors) came through on cover my meds was submitted with last office notes awaiting approval or denial.  KEY:[id number]

## 2023-12-02 NOTE — Telephone Encounter (Signed)
Prior Authorization for patient (Jardiance 10MG  tablets) came through on cover my meds was submitted with last office notes and labs awaiting approval or denial.  WUJ:[id number]

## 2023-12-02 NOTE — Telephone Encounter (Signed)
Decision:Approved Park Pope (Key: [id number]) Rx #: [id number] [phone] 2.4MG /0.75ML auto-injectors Form [employer] of [facility] Electronic Prior Authorization Request Form [phone] NCPDP) Created Your prior authorization for Reginal Lutes has been approved! More Info Personalized support and financial assistance may be available through the [employer] program. For more information, and to see program requirements, click on the More Info button to the right.  Message from plan: Approved. This drug has been approved. Approved quantity: 3 milliliters per 28 day(s). You may fill up to a 34 day supply at a retail pharmacy. You may fill up to a 90 day supply for maintenance drugs, please refer to the formulary for details. Please call the pharmacy to process your prescription claim.. Authorization Expiration Date: May 30, 2024.

## 2023-12-03 ENCOUNTER — Ambulatory Visit: Payer: Medicaid Other

## 2023-12-04 ENCOUNTER — Other Ambulatory Visit: Payer: Self-pay | Admitting: Obstetrics and Gynecology

## 2023-12-04 NOTE — Patient Outreach (Signed)
Medicaid Managed Care   Nurse Care Manager Note  12/04/2023 Name:  [name] MRN:  [id number] DOB:  1961/01/11  [name] is an 62 y.o. year old female who is a primary patient of Philomena Doheny, MD.  The Medicaid Managed Care Coordination team was consulted for assistance with:    Chronic healthcare management needs, GERD, OSA, HF, CAD, HLD, RLS, COPD, asthma, h/o breast cancer, sciatica, HTN, DM, neuropathy, anxiety, depression, PTSD  [name] was given information about Medicaid Managed Care Coordination team services today. [name] Patient agreed to services and verbal consent obtained.  Engaged with patient by telephone for follow up visit in response to provider referral for case management and/or care coordination services.   Assessments/Interventions:  Review of past medical history, allergies, medications, health status, including review of consultants reports, laboratory and other test data, was performed as part of comprehensive evaluation and provision of chronic care management services.  SDOH (Social Determinants of Health) assessments and interventions performed: SDOH Interventions    Flowsheet Row Patient Outreach Telephone from 12/04/2023 in [place] POPULATION HEALTH DEPARTMENT Patient Outreach Telephone from 10/20/2023 in [place] POPULATION HEALTH DEPARTMENT Patient Outreach Telephone from 09/21/2023 in [place] POPULATION HEALTH DEPARTMENT Patient Outreach Telephone from 09/07/2023 in [place] POPULATION HEALTH DEPARTMENT Patient Outreach Telephone from 08/21/2023 in [place] POPULATION HEALTH DEPARTMENT Patient Outreach Telephone from 07/21/2023 in [place] POPULATION HEALTH DEPARTMENT  SDOH Interventions        Food Insecurity Interventions Intervention Not Indicated -- -- -- -- --  Housing Interventions Intervention Not Indicated -- -- -- -- --  Transportation Interventions -- -- Intervention Not Indicated -- -- --   Utilities Interventions -- -- Intervention Not Indicated -- -- --  Alcohol Usage Interventions -- -- -- -- Intervention Not Indicated (Score <7) --  Financial Strain Interventions -- Intervention Not Indicated -- -- -- --  Physical Activity Interventions -- -- -- -- -- Other (Comments)  [not able to perform exercise at this level]  Stress Interventions -- -- -- [employer], Provide Counseling -- --  Social Connections Interventions -- Intervention Not Indicated -- -- -- --  Health Literacy Interventions -- -- -- -- -- Intervention Not Indicated     Care Plan No Known Allergies  Medications Reviewed Today     Reviewed by [name], RN (Registered Nurse) on 12/04/23 at [phone]  Med List Status: <None>   Medication Order Taking? Sig Documenting Provider Last Dose Status Informant  acetaminophen (TYLENOL) 500 MG tablet [id number] No Take 1,000 mg by mouth every 6 (six) hours as needed for moderate pain. [provider] Taking Active Self  amLODipine (NORVASC) 5 MG tablet [id number] No Take 1 tablet (5 mg total) by mouth daily. Evlyn Kanner, MD Taking Active Self  aspirin EC 81 MG tablet [id number] No Take 1 tablet (81 mg total) by mouth daily. Swallow whole. Magnant, Joycie Peek, PA-C Taking Active   atorvastatin (LIPITOR) 80 MG tablet [id number] No Take 1 tablet (80 mg total) by mouth daily. Evlyn Kanner, MD Taking Active Self  Blood Pressure Monitoring (BLOOD PRESSURE CUFF) MISC [id number] No Please follow insert instructions. Philomena Doheny, MD Taking Active Self  busPIRone (BUSPAR) 15 MG tablet [id number] No Take 1 tablet (15 mg total) by mouth 2 (two) times daily. Shanna Cisco, NP Taking Active   cholecalciferol (VITAMIN D3) 25 MCG (1000 UNIT) tablet [id number] No Take 1,000 Units by mouth daily. [provider]  Taking Active Self  clopidogrel (PLAVIX) 75 MG tablet [id number] No Take 1 tablet (75 mg total) by mouth every  morning. Kathleene Hazel, MD Taking Active   Continuous Glucose Receiver [facility] G7 RECEIVER) [place] [id number] No 1 Units by Does not apply route daily. Faith Rogue, DO Taking Active   Continuous Glucose Sensor (DEXCOM G7 SENSOR) MISC [id number] No Place new sensor every 10 days. Use to monitor blood sugar continuously. Modena Slater, DO Taking Active   Continuous Glucose Sensor (DEXCOM G7 SENSOR) MISC [id number] No 1 Units by Does not apply route daily. Faith Rogue, DO Taking Active   Continuous Glucose Sensor (FREESTYLE LIBRE 3 SENSOR) [place] [id number] No Place 1 sensor on the skin every 14 days. Use to check glucose continuously Reymundo Poll, MD Taking Active Self  DULoxetine (CYMBALTA) 20 MG capsule [id number] No Take 1 capsule (20 mg total) by mouth daily. Shanna Cisco, NP Taking Active   empagliflozin (JARDIANCE) 10 MG TABS tablet [id number]  Take 1 tablet (10 mg total) by mouth daily before breakfast. Annett Fabian, MD  Active   Evolocumab [facility] SURECLICK) 140 MG/ML Ivory Broad [id number] No Inject 140 mg into the skin every 14 (fourteen) days. Reymundo Poll, MD Taking Active Self  exemestane (AROMASIN) 25 MG tablet [id number] No TAKE 1 TABLET BY MOUTH ONCE DAILY AFTER Philippa Sicks, MD Taking Active Self  ezetimibe (ZETIA) 10 MG tablet [id number] No Take 1 tablet by mouth once daily  Patient taking differently: Take 10 mg by mouth at bedtime.   Swinyer, Zachary George, NP Taking Active Self  Discontinued 05/09/20 1429   Fluticasone-Umeclidin-Vilant (TRELEGY ELLIPTA) 100-62.5-25 MCG/ACT AEPB [id number] No INHALE 1 PUFF ONCE DAILY Philomena Doheny, MD Taking Active   furosemide (LASIX) 40 MG tablet [id number] No Take 1 tablet (40 mg total) by mouth as needed for fluid or edema. Chauncey Mann, DO Taking Active Self  gabapentin (NEURONTIN) 300 MG capsule [id number]  TAKE 1 CAPSULE BY MOUTH IN THE MORNING AND 3 AT BEDTIME Colbert Coyer, Priscila, MD  Active    hydrocortisone cream 1 % [id number] No Apply 1 Application topically daily as needed for itching. [provider] Taking Active Self  losartan-hydrochlorothiazide (HYZAAR) 100-25 MG tablet [id number] No Take 1 tablet by mouth daily. Please repeat kidney labs to get additional refills Rana Snare, DO Taking Active   metFORMIN (GLUCOPHAGE-XR) 500 MG 24 hr tablet [id number] No Take 1 tablet (500 mg total) by mouth 2 (two) times daily. Reymundo Poll, MD Taking Active Self  methocarbamol (ROBAXIN) 500 MG tablet [id number] No Take 1 tablet (500 mg total) by mouth every 8 (eight) hours as needed for muscle spasms. Magnant, Joycie Peek, PA-C Taking Active   metoprolol succinate (TOPROL-XL) 50 MG 24 hr tablet [id number] No Take 1 tablet (50 mg total) by mouth daily. Take with or immediately following a meal.TAKE 1 TABLET BY MOUTH ONCE DAILY WITH MEALS OR  IMMEDIATELY  FOLLOWING  A  MEAL Philomena Doheny, MD Taking Active   montelukast (SINGULAIR) 10 MG tablet [id number] No TAKE 1 TABLET BY MOUTH AT BEDTIME Steffanie Rainwater, MD Taking Active Self  oxyCODONE (OXY IR/ROXICODONE) 5 MG immediate release tablet [id number] No Take 1 tablet (5 mg total) by mouth every 6 (six) hours as needed for moderate pain (pain score 4-6). Magnant, Joycie Peek, PA-C Taking Active   Semaglutide-Weight Management (WEGOVY) 2.4 MG/0.75ML SOAJ [id number]  Inject 2.4 mg into the skin once a week. Annett Fabian, MD  Active   senna-docusate (SENNA-TIME  S) 8.6-50 MG tablet [id number] No Take 1 tablet by mouth at bedtime as needed for mild constipation. Briscoe Burns, MD Taking Active Self           Med Note Gunnar Fusi, MELISSA R   Thu Aug 27, 2023  1:05 PM) ONLY AS NEEDED   traZODone (DESYREL) 50 MG tablet [id number] No Take 1-2 tablets (50-100 mg total) by mouth at bedtime. Shanna Cisco, NP Taking Active   VENTOLIN HFA 108 (90 Base) MCG/ACT inhaler [id number] No Inhale 2 puffs into the lungs every 6 (six) hours as  needed for wheezing or shortness of breath. Faith Rogue, DO Taking Active Self           Patient Active Problem List   Diagnosis Date Noted   Biceps tendonitis on right 09/19/2023   OA (osteoarthritis) of shoulder 09/15/2023   S/P reverse total shoulder arthroplasty, right 09/15/2023   Chronic constipation 08/15/2023   History of iron deficiency 08/15/2023   Antiplatelet or antithrombotic long-term use 08/15/2023   Abdominal hernia 03/19/2023   Diabetic neuropathy (HCC) 08/29/2022   Vaginal lesion 08/29/2022   PTSD (post-traumatic stress disorder) 06/24/2022   GAD (generalized anxiety disorder) 06/24/2022   Major depressive disorder, recurrent episode, moderate (HCC) 06/24/2022   Urinary incontinence 06/20/2022   Intertrigo 06/20/2022   Arthritis of left shoulder region    S/P reverse total shoulder arthroplasty, 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   Healthcare maintenance 11/17/2021   Spondylolisthesis, lumbar region 02/11/2021    Class: Acute   Malignant neoplasm of upper-outer quadrant of right breast in female, estrogen receptor positive (HCC) 06/10/2018   Restless leg syndrome 02/04/2018   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   Depression 08/06/2015   Morbid obesity (HCC)    Hypertension associated with diabetes (HCC)    Aortic stenosis s/p TAVR    Chronic diastolic congestive heart failure (HCC)    COPD (chronic obstructive pulmonary disease) (HCC) 09/21/2013   Obstructive sleep apnea 09/21/2013   Nephrolithiasis 09/21/2013   Nocturnal hypoxemia 09/12/2013   Hyperlipidemia 11/30/2012   History of pulmonary embolus (PE) 07/09/2012   Normocytic anemia 02/08/2012   Type 2 diabetes mellitus without complication, without long-term current use of insulin (HCC) 02/08/2012   Asthma 02/08/2012   GERD (gastroesophageal reflux disease)    CAD (coronary artery disease) 12/08/2011   Conditions to be addressed/monitored per PCP order:  Chronic healthcare management needs, GERD,  OSA, HF, CAD, HLD, RLS, COPD, asthma, h/o breast cancer, sciatica, HTN, DM, neuropathy, anxiety, depression, PTSD  Care Plan : RN Care Manager Plan of Care  Updates made by [name], RN since 12/04/2023 12:00 AM     Problem: Health Promotion or Disease Self-Management (General Plan of Care)   Priority: High  Onset Date: 04/14/2022     Long-Range Goal: Chronic Disease Management and Care Coordination Needs   Start Date: 04/14/2022  Expected End Date: 01/20/2024  Recent Progress: Not on track  Priority: High  Note:   Current Barriers:  Knowledge Deficits related to plan of care for management of Anxiety and Depression along with other chronic health conditions, HTN, DM, HF, asthma, COPD, OSA, arthritis, GERD, CAD, HLD, h/o breast cancer, RLS Chronic Disease Management support and education needs related to Anxiety and Depression. DM, HTN, HF, asthma, OSA, arthritis, GERD, CAD, HLD, h/o breast cancer, RLS. 12/04/23:  Patient continues to do well following shoulder surgery.  BP and BG stable, 126-146. Continues to lose weight.  Changing to [place] and Jardiance instead of Metformin and  Ozempic.  Breathing stable.  Some lose stools with meds but manageable. Upcoming MMG.  Some recent dizziness-evaluated by PCP 12/3.  RNCM Clinical Goal(s):  Patient will verbalize understanding of plan for management of Anxiety and Depression, DM, HTN  as evidenced by patient report take all medications exactly as prescribed and will call provider for medication related questions as evidenced by patient report demonstrate understanding of rationale for each prescribed medication as evidenced by patient report attend all scheduled medical appointments  as evidenced by patient report continue to work with RN Care Manager to address care management and care coordination needs related to  Anxiety and Depression, DM, HTN as evidenced by adherence to CM Team Scheduled appointments work with Child psychotherapist to address   related to the management of Mental Health Concerns  related to the management of Anxiety and Depression as evidenced by review of EMR and patient or Child psychotherapist report through collaboration with Medical illustrator, provider, and care team.   Interventions: Inter-disciplinary care team collaboration (see longitudinal plan of care) Evaluation of current treatment plan related to  self management and patient's adherence to plan as established by provider   (Status:  New goal.)  Long Term Goal Evaluation of current treatment plan related to Anxiety and Depression, Mental Health Concerns  self-management and patient's adherence to plan as established by provider. Discussed plans with patient for ongoing care management follow up and provided patient with direct contact information for care management team Advised patient to contact provider for any additional DME needs, CBG, and possible general surgeon referral. Reviewed medications with patient Collaborated with LCSW regarding anxiety/depression Reviewed scheduled/upcoming provider appointments Advised patient, providing education and rationale, to check cbg and record, calling provider  for findings outside established parameters  Social Work referral for anxiety/depression Discussed plans with patient for ongoing care management follow up and provided patient with direct contact information for care management team Assessed social determinant of health barriers Collaborated with Lubertha Sayres Managed Medicaid Liaison regarding DME-motorized wheelchair and lift chair. LCSW appointment rescheduled. Collaborated with Pharmacist. Pharmacy referral for polypharmacy at patient's request-completed  Asthma: (Status:New goal.) Long Term Goal Advised patient to track and manage Asthma triggers Advised patient to engage in light exercise as tolerated 3-5 days a week to aid in the the management of Asthma Provided education about and advised  patient to utilize infection prevention strategies to reduce risk of respiratory infection Discussed the importance of adequate rest and management of fatigue with Asthma Assessed social determinant of health barriers   CAD Interventions: (Status:  New goal.) Long Term Goal Assessed understanding of CAD diagnosis Medications reviewed including medications utilized in CAD treatment plan Provided education on importance of blood pressure control in management of CAD Provided education on Importance of limiting foods high in cholesterol Counseled on importance of regular laboratory monitoring as prescribed Counseled on the importance of exercise goals with target of 150 minutes per week Reviewed Importance of taking all medications as prescribed Reviewed Importance of attending all scheduled provider appointments Advised to report any changes in symptoms or exercise tolerance Assessed social determinant of health barriers  Heart Failure Interventions:  (Status:  New goal.) Long Term Goal Basic overview and discussion of pathophysiology of Heart Failure reviewed Provided education on low sodium diet Assessed need for readable accurate scales in home Provided education about placing scale on hard, flat surface Advised patient to weigh each morning after emptying bladder Discussed importance of daily weight and advised patient to weigh and  record daily Discussed the importance of keeping all appointments with provider Provided patient with education about the role of exercise in the management of heart failure Assessed social determinant of health barriers   COPD Interventions:  (Status:  New goal.) Long Term Goal Advised patient to track and manage COPD triggers Advised patient to self assesses COPD action plan zone and make appointment with provider if in the yellow zone for 48 hours without improvement Advised patient to engage in light exercise as tolerated 3-5 days a week to aid in the  the management of COPD Provided education about and advised patient to utilize infection prevention strategies to reduce risk of respiratory infection Discussed the importance of adequate rest and management of fatigue with COPD Assessed social determinant of health barriers  Hyperlipidemia Interventions:  (Status:  New goal.) Long Term Goal Medication review performed; medication list updated in electronic medical record.  Provider established cholesterol goals reviewed Counseled on importance of regular laboratory monitoring as prescribed Reviewed importance of limiting foods high in cholesterol Reviewed exercise goals and target of 150 minutes per week Assessed social determinant of health barriers    Diabetes Interventions:  (Status:  New goal.) Long Term Goal Assessed patient's understanding of A1c goal: <7% Reviewed medications with patient and discussed importance of medication adherence Reviewed scheduled/upcoming provider appointments  Advised patient, providing education and rationale, to check cbg and record, calling provider for findings outside established parameters Review of patient status, including review of consultants reports, relevant laboratory and other test results, and medications completed Assessed social determinant of health barriers Lab Results  Component Value Date   HGBA1C 6.6 12/01/23   HGBA1C   7.0 on 08/18/23 HGBA1C  7.1 on 09/09/23  Hypertension Interventions:  (Status:  New goal.) Long Term Goal Last practice recorded BP readings:  BP Readings from Last 3 Encounters:        08/18/23         153/74 10/20/23         114/74 12/01/23         124/63  Most recent eGFR/CrCl:  Lab Results  Component Value Date   EGFR 90 06/04/2022    No components found for: "CRCL"  Evaluation of current treatment plan related to hypertension self management and patient's adherence to plan as established by provider Reviewed medications with patient and discussed  importance of compliance Discussed plans with patient for ongoing care management follow up and provided patient with direct contact information for care management team Advised patient, providing education and rationale, to monitor blood pressure daily and record, calling PCP for findings outside established parameters Reviewed scheduled/upcoming provider appointments including:  Assessed social determinant of health barriers   Patient Goals/Self-Care Activities: Take all medications as prescribed Attend all scheduled provider appointments Call pharmacy for medication refills 3-7 days in advance of running out of medications Perform all self care activities independently  Perform IADL's (shopping, preparing meals, housekeeping, managing finances) independently Call provider office for new concerns or questions  Work with the social worker to address care coordination needs and will continue to work with the clinical team to address health care and disease management related needs Patient to f/u with provider regarding sciatica pain-completed  Follow Up Plan:  The patient has been provided with contact information for the care management team and has been advised to call with any health related questions or concerns.  The care management team will reach out to the patient again over the next 60 business  days.    Eliberto Ivory Goal: Establish Plan of Care for Chronic Disease Management Needs   Priority: High  Note:   Timeframe:  Long-Range Goal Priority:  High Start Date:      04/14/22                       Expected End Date:     ongoing                  Follow Up Date: 01/18/24   - schedule appointment for flu shot - schedule appointment for vaccines needed due to my age or health - schedule recommended health tests (blood work, mammogram, colonoscopy, pap test) - schedule and keep appointment for annual check-up    Why is this important?   Screening tests can find diseases early when  they are easier to treat.  Your doctor or nurse will talk with you about which tests are important for you.  Getting shots for common diseases like the flu and shingles will help prevent them.  01/18/24: Upcoming MMG   Follow Up:  Patient agrees to Care Plan and Follow-up.  Plan: The Managed Medicaid care management team will reach out to the patient again over the next 60 business  days. and The  Patient has been provided with contact information for the Managed Medicaid care management team and has been advised to call with any health related questions or concerns.  Date/time of next scheduled RN care management/care coordination outreach: 01/18/24 at 1030.

## 2023-12-04 NOTE — Patient Instructions (Signed)
Hi [name] you are doing well!  Have a great day!  [name] was given information about Medicaid Managed Care team care coordination services as a part of their [facility] Medicaid benefit. [name] verbally consented to engagement with the [facility] Managed Care team.   If you are experiencing a medical emergency, please call 911 or report to your local emergency department or urgent care.   If you have a non-emergency medical problem during routine business hours, please contact your provider's office and ask to speak with a nurse.   For questions related to your [facility] health plan, please call: [phone] or go here:[email or web address]  If you would like to schedule transportation through your [facility] plan, please call the following number at least 2 days in advance of your appointment: [phone].   You can also use the MTM portal or MTM mobile app to manage your rides. Reimbursement for transportation is available through [facility]! For the portal, please go to [email or web address]  Call the [facility] Crisis Line at [phone], at any time, 24 hours a day, 7 days a week. If you are in danger or need immediate medical attention call 911.  If you would like help to quit smoking, call 1-800-QUIT-NOW (480-325-6726) OR Espaol: 1-855-Djelo-Ya (1-093-235-5732) o para ms informacin haga clic aqu or Text READY to 202-542 to register via text  [name] - following are the goals we discussed in your visit today:   Goals Addressed    Timeframe:  Long-Range Goal Priority:  High Start Date:      04/14/22                       Expected End Date:     ongoing                  Follow Up Date: 01/18/24   - schedule appointment for flu shot - schedule appointment for vaccines needed due to my age or health - schedule recommended health tests (blood work, mammogram, colonoscopy, pap test) - schedule and keep appointment for annual  check-up    Why is this important?   Screening tests can find diseases early when they are easier to treat.  Your doctor or nurse will talk with you about which tests are important for you.  Getting shots for common diseases like the flu and shingles will help prevent them.  01/18/24: Upcoming MMG  Patient verbalizes understanding of instructions and care plan provided today and agrees to view in MyChart. Active MyChart status and patient understanding of how to access instructions and care plan via MyChart confirmed with patient.     The Managed Medicaid care management team will [name] out to the patient again over the next 60 business  days.  The  Patient has been provided with contact information for the Managed Medicaid care management team and has been advised to call with any health related questions or concerns.   [name] RN, BSN [place]  [facility] Care Management Coordinator - Managed Medicaid High Risk [phone]   Following is a copy of your plan of care:  Care Plan : RN Care Manager Plan of Care  Updates made by [name], RN since 12/04/2023 12:00 AM     Problem: Health Promotion or Disease Self-Management (General Plan of Care)   Priority: High  Onset Date: 04/14/2022     Long-Range Goal: Chronic Disease Management and Care Coordination Needs   Start Date:  04/14/2022  Expected End Date: 01/20/2024  Recent Progress: Not on track  Priority: High  Note:   Current Barriers:  Knowledge Deficits related to plan of care for management of Anxiety and Depression along with other chronic health conditions, HTN, DM, HF, asthma, COPD, OSA, arthritis, GERD, CAD, HLD, h/o breast cancer, RLS Chronic Disease Management support and education needs related to Anxiety and Depression. DM, HTN, HF, asthma, OSA, arthritis, GERD, CAD, HLD, h/o breast cancer, RLS. 12/04/23:  Patient continues to do well following shoulder surgery.  BP and BG stable, 126-146.  Continues to lose weight.  Changing to [facility] and Jardiance instead of Metformin and Ozempic.  Breathing stable.  Some lose stools with meds but manageable. Upcoming MMG.  Some recent dizziness-evaluated by PCP 12/3.  RNCM Clinical Goal(s):  Patient will verbalize understanding of plan for management of Anxiety and Depression, DM, HTN  as evidenced by patient report take all medications exactly as prescribed and will call provider for medication related questions as evidenced by patient report demonstrate understanding of rationale for each prescribed medication as evidenced by patient report attend all scheduled medical appointments  as evidenced by patient report continue to work with RN Care Manager to address care management and care coordination needs related to  Anxiety and Depression, DM, HTN as evidenced by adherence to CM Team Scheduled appointments work with Child psychotherapist to address  related to the management of Mental Health Concerns  related to the management of Anxiety and Depression as evidenced by review of EMR and patient or Child psychotherapist report through collaboration with Medical illustrator, provider, and care team.   Interventions: Inter-disciplinary care team collaboration (see longitudinal plan of care) Evaluation of current treatment plan related to  self management and patient's adherence to plan as established by provider   (Status:  New goal.)  Long Term Goal Evaluation of current treatment plan related to Anxiety and Depression, Mental Health Concerns  self-management and patient's adherence to plan as established by provider. Discussed plans with patient for ongoing care management follow up and provided patient with direct contact information for care management team Advised patient to contact provider for any additional DME needs, CBG, and possible general surgeon referral. Reviewed medications with patient Collaborated with LCSW regarding anxiety/depression Reviewed  scheduled/upcoming provider appointments Advised patient, providing education and rationale, to check cbg and record, calling provider  for findings outside established parameters  Social Work referral for anxiety/depression Discussed plans with patient for ongoing care management follow up and provided patient with direct contact information for care management team Assessed social determinant of health barriers Collaborated with Lubertha Sayres Managed Medicaid Liaison regarding DME-motorized wheelchair and lift chair. LCSW appointment rescheduled. Collaborated with Pharmacist. Pharmacy referral for polypharmacy at patient's request-completed  Asthma: (Status:New goal.) Long Term Goal Advised patient to track and manage Asthma triggers Advised patient to engage in light exercise as tolerated 3-5 days a week to aid in the the management of Asthma Provided education about and advised patient to utilize infection prevention strategies to reduce risk of respiratory infection Discussed the importance of adequate rest and management of fatigue with Asthma Assessed social determinant of health barriers   CAD Interventions: (Status:  New goal.) Long Term Goal Assessed understanding of CAD diagnosis Medications reviewed including medications utilized in CAD treatment plan Provided education on importance of blood pressure control in management of CAD Provided education on Importance of limiting foods high in cholesterol Counseled on importance of regular laboratory monitoring as prescribed Counseled  on the importance of exercise goals with target of 150 minutes per week Reviewed Importance of taking all medications as prescribed Reviewed Importance of attending all scheduled provider appointments Advised to report any changes in symptoms or exercise tolerance Assessed social determinant of health barriers  Heart Failure Interventions:  (Status:  New goal.) Long Term Goal Basic overview  and discussion of pathophysiology of Heart Failure reviewed Provided education on low sodium diet Assessed need for readable accurate scales in home Provided education about placing scale on hard, flat surface Advised patient to weigh each morning after emptying bladder Discussed importance of daily weight and advised patient to weigh and record daily Discussed the importance of keeping all appointments with provider Provided patient with education about the role of exercise in the management of heart failure Assessed social determinant of health barriers   COPD Interventions:  (Status:  New goal.) Long Term Goal Advised patient to track and manage COPD triggers Advised patient to self assesses COPD action plan zone and make appointment with provider if in the yellow zone for 48 hours without improvement Advised patient to engage in light exercise as tolerated 3-5 days a week to aid in the the management of COPD Provided education about and advised patient to utilize infection prevention strategies to reduce risk of respiratory infection Discussed the importance of adequate rest and management of fatigue with COPD Assessed social determinant of health barriers  Hyperlipidemia Interventions:  (Status:  New goal.) Long Term Goal Medication review performed; medication list updated in electronic medical record.  Provider established cholesterol goals reviewed Counseled on importance of regular laboratory monitoring as prescribed Reviewed importance of limiting foods high in cholesterol Reviewed exercise goals and target of 150 minutes per week Assessed social determinant of health barriers    Diabetes Interventions:  (Status:  New goal.) Long Term Goal Assessed patient's understanding of A1c goal: <7% Reviewed medications with patient and discussed importance of medication adherence Reviewed scheduled/upcoming provider appointments  Advised patient, providing education and rationale, to  check cbg and record, calling provider for findings outside established parameters Review of patient status, including review of consultants reports, relevant laboratory and other test results, and medications completed Assessed social determinant of health barriers Lab Results  Component Value Date   HGBA1C 6.6 12/01/23   HGBA1C   7.0 on 08/18/23 HGBA1C  7.1 on 09/09/23  Hypertension Interventions:  (Status:  New goal.) Long Term Goal Last practice recorded BP readings:  BP Readings from Last 3 Encounters:        08/18/23         153/74 10/20/23         114/74 12/01/23         124/63  Most recent eGFR/CrCl:  Lab Results  Component Value Date   EGFR 90 06/04/2022    No components found for: "CRCL"  Evaluation of current treatment plan related to hypertension self management and patient's adherence to plan as established by provider Reviewed medications with patient and discussed importance of compliance Discussed plans with patient for ongoing care management follow up and provided patient with direct contact information for care management team Advised patient, providing education and rationale, to monitor blood pressure daily and record, calling PCP for findings outside established parameters Reviewed scheduled/upcoming provider appointments including:  Assessed social determinant of health barriers   Patient Goals/Self-Care Activities: Take all medications as prescribed Attend all scheduled provider appointments Call pharmacy for medication refills 3-7 days in advance of running out of medications  Perform all self care activities independently  Perform IADL's (shopping, preparing meals, housekeeping, managing finances) independently Call provider office for new concerns or questions  Work with the social worker to address care coordination needs and will continue to work with the clinical team to address health care and disease management related needs Patient to f/u with  provider regarding sciatica pain-completed  Follow Up Plan:  The patient has been provided with contact information for the care management team and has been advised to call with any health related questions or concerns.  The care management team will [name] out to the patient again over the next 60 business  days.

## 2023-12-08 NOTE — Progress Notes (Signed)
 Internal Medicine Clinic Attending  I saw and evaluated the patient.  I personally confirmed the key portions of the history and exam documented by Dr. Versie Starks and I reviewed pertinent patient test results.  The assessment, diagnosis, and plan were formulated together and I agree with the documentation in the resident's note.

## 2023-12-09 ENCOUNTER — Ambulatory Visit
Admission: RE | Admit: 2023-12-09 | Discharge: 2023-12-09 | Disposition: A | Discharge status: Home / Self Care | Payer: Medicaid Other | Source: Ambulatory Visit | Attending: Hematology and Oncology | Admitting: Hematology and Oncology

## 2023-12-09 DIAGNOSIS — C50411 Malignant neoplasm of upper-outer quadrant of right female breast: Secondary | ICD-10-CM

## 2023-12-09 DIAGNOSIS — Z1231 Encounter for screening mammogram for malignant neoplasm of breast: Secondary | ICD-10-CM | POA: Diagnosis not present

## 2023-12-13 ENCOUNTER — Other Ambulatory Visit: Payer: Self-pay | Admitting: Student

## 2023-12-13 DIAGNOSIS — I152 Hypertension secondary to endocrine disorders: Secondary | ICD-10-CM

## 2023-12-14 ENCOUNTER — Telehealth: Payer: Medicaid Other | Admitting: Physician Assistant

## 2023-12-14 ENCOUNTER — Telehealth: Payer: Medicaid Other | Admitting: Family Medicine

## 2023-12-14 DIAGNOSIS — R3989 Other symptoms and signs involving the genitourinary system: Secondary | ICD-10-CM

## 2023-12-14 MED ORDER — CEPHALEXIN 500 MG PO CAPS
500.0000 mg | ORAL_CAPSULE | Freq: Two times a day (BID) | ORAL | 0 refills | Status: DC
Start: 2023-12-14 — End: 2024-01-29

## 2023-12-14 NOTE — Progress Notes (Addendum)
The patient no-showed for appointment despite this provider sending direct link, reaching out via phone with no response and waiting for at least 10 minutes from appointment time for patient to join. They will be marked as a NS for this appointment/time.   Freddy Finner, NP    Tried to reopen visit after patient called her office. Still unable to get her to connect with a link, called phone twice, left message.  Will need to reschedule for a later time, messaged her office CMA back to explain still unable to connect via links or phone.

## 2023-12-14 NOTE — Progress Notes (Signed)
E-Visit for Urinary Problems  We are sorry that you are not feeling well.  Here is how we plan to help!  Based on what you shared with me it looks like you most likely have a simple urinary tract infection.  A UTI (Urinary Tract Infection) is a bacterial infection of the bladder.  Most cases of urinary tract infections are simple to treat but a key part of your care is to encourage you to drink plenty of fluids and watch your symptoms carefully.  I have prescribed Keflex 500 mg twice a day for 7 days.  Your symptoms should gradually improve. Call us if the burning in your urine worsens, you develop worsening fever, back pain or pelvic pain or if your symptoms do not resolve after completing the antibiotic.  Urinary tract infections can be prevented by drinking plenty of water to keep your body hydrated.  Also be sure when you wipe, wipe from front to back and don't hold it in!  If possible, empty your bladder every 4 hours.  HOME CARE Drink plenty of fluids Compete the full course of the antibiotics even if the symptoms resolve Remember, when you need to go.go. Holding in your urine can increase the likelihood of getting a UTI! GET HELP RIGHT AWAY IF: You cannot urinate You get a high fever Worsening back pain occurs You see blood in your urine You feel sick to your stomach or throw up You feel like you are going to pass out  MAKE SURE YOU  Understand these instructions. Will watch your condition. Will get help right away if you are not doing well or get worse.   Thank you for choosing an e-visit.  Your e-visit answers were reviewed by a board certified advanced clinical practitioner to complete your personal care plan. Depending upon the condition, your plan could have included both over the counter or prescription medications.  Please review your pharmacy choice. Make sure the pharmacy is open so you can pick up prescription now. If there is a problem, you may contact your  provider through MyChart messaging and have the prescription routed to another pharmacy.  Your safety is important to us. If you have drug allergies check your prescription carefully.   For the next 24 hours you can use MyChart to ask questions about today's visit, request a non-urgent call back, or ask for a work or school excuse. You will get an email in the next two days asking about your experience. I hope that your e-visit has been valuable and will speed your recovery.  I have spent 5 minutes in review of e-visit questionnaire, review and updating patient chart, medical decision making and response to patient.   Shiheem Corporan M Miliana Gangwer, PA-C  

## 2023-12-15 ENCOUNTER — Ambulatory Visit: Payer: Medicaid Other | Admitting: Physician Assistant

## 2023-12-15 ENCOUNTER — Telehealth (HOSPITAL_COMMUNITY): Payer: Medicaid Other | Admitting: Psychiatry

## 2023-12-15 ENCOUNTER — Encounter (HOSPITAL_COMMUNITY): Payer: Self-pay | Admitting: Psychiatry

## 2023-12-15 DIAGNOSIS — F411 Generalized anxiety disorder: Secondary | ICD-10-CM | POA: Diagnosis not present

## 2023-12-15 DIAGNOSIS — F32A Depression, unspecified: Secondary | ICD-10-CM

## 2023-12-15 DIAGNOSIS — F4310 Post-traumatic stress disorder, unspecified: Secondary | ICD-10-CM

## 2023-12-15 MED ORDER — TRAZODONE HCL 50 MG PO TABS: 50.0000 mg | ORAL_TABLET | Freq: Every day | ORAL | 3 refills | Status: DC

## 2023-12-15 MED ORDER — DULOXETINE HCL 20 MG PO CPEP: 20.0000 mg | ORAL_CAPSULE | Freq: Every day | ORAL | 3 refills | Status: DC

## 2023-12-15 MED ORDER — BUSPIRONE HCL 15 MG PO TABS: 15.0000 mg | ORAL_TABLET | Freq: Two times a day (BID) | ORAL | 3 refills | Status: DC

## 2023-12-15 NOTE — Progress Notes (Signed)
BH MD/PA/NP OP Progress Note Virtual Visit via Video Note  I connected with [name] on 12/15/23 at 10:30 AM EST by a video enabled telemedicine application and verified that I am speaking with the correct person using two identifiers.  Location: Patient: Home Provider: Clinic   I discussed the limitations of evaluation and management by telemedicine and the availability of in person appointments. The patient expressed understanding and agreed to proceed.  I provided 30 minutes of non-face-to-face time during this encounter.   12/15/2023 10:52 AM [name]  MRN:  [id number]  Chief Complaint: "I get irritable some days"  HPI:  62 year old female seen today for follow-up psychiatric evaluation.  She has a psychiatric history of anxiety, depression, and PTSD.  Currently she is being managed on BuSpar 15 mg 3 times daily (noting that she takes it twice daily), Cymbalta 20 mg daily, and trazodone 50 to 100 mg nightly as needed.  She notes the medications are effective in managing her psychiatric condition.   Today she was well-groomed, pleasant, cooperative, and engaged in conversation.  She informed Clinical research associate that at times she has been more irritable. She reports that it could be related to her UTI or the change in season. Patient notes that she has been snapping on her loved ones.   Since her last visit she notes that her anxiety and depression continues to be well managed. Today provider conducted a GAD 7 and patient scored a 4. Provider also conducted a PHQ 9 and patient scored a 1. She endorses adequate sleep and appetite. Patient notes that she continues to lose weight. She notes that she has lost over 40 lbs past year with the help of Ozempic.   Patient notes that her that her pain in her shoulder has subsided. She quantifies her pain as 0/10, at her last visit she quantified it as 10/10. Patient notes that she and her husband are looking forward to spending Christmas  with their sons. She notes that one of her sons is a Stage manager and will be cooking dinner.   Over all patient notes that she is doing well. No medication changes made today.  Patient agreeable to continue medication as prescribed.  No other concerns noted at this time.     Visit Diagnosis:    ICD-10-CM   1. GAD (generalized anxiety disorder)  F41.1 busPIRone (BUSPAR) 15 MG tablet    DULoxetine (CYMBALTA) 20 MG capsule    traZODone (DESYREL) 50 MG tablet    2. PTSD (post-traumatic stress disorder)  F43.10 busPIRone (BUSPAR) 15 MG tablet    traZODone (DESYREL) 50 MG tablet    3. Mild depression  F32.A busPIRone (BUSPAR) 15 MG tablet    DULoxetine (CYMBALTA) 20 MG capsule    traZODone (DESYREL) 50 MG tablet        Past Psychiatric History: anxiety, depression, and PTSD  Past Medical History:  Past Medical History:  Diagnosis Date   Anemia    Anxiety    Aortic valve stenosis, severe    Arthritis    PAIN AND SEVERE OA LEFT KNEE ; S/P RIGHT TKA ON 02/03/12; HAS LOWER BACK PAIN-UNABLE TO STAND MORE THAN 10 MIN; ARTHRITIS "ALL OVER"   Asthma    Blood transfusion    2013[facility]   Breast cancer in female [facility])    Right   CAD (coronary artery disease)    Cath 2010 with DES x 1 RCA-- PT'S CARDIOLOGIST IS DR. MCALHANY   Chronic diastolic congestive heart  failure (HCC)    COPD (chronic obstructive pulmonary disease) (HCC)    Mild   COPD exacerbation (HCC) 11/16/2022   Depression    Diabetes mellitus DIAGNOSED IN2010   Dyspnea    with much ambulation   Eczema    on back   Headache    migraines younger- rare now 02/07/21   Heart murmur    no current problems   History of hiatal hernia    History of kidney stones    passed or blasted   History of UTI 12/05/2022   Hyperlipidemia    Hypertension    Morbid obesity with body mass index of 60.0-69.9 in adult [facility])    Myocardial infarction (HCC)    PT THINKS SHE WAS DX WITH MI AT THE TIME OF HEART STENTING   Neuromuscular disorder (HCC)     bilateral arm/hands   Oxygen dependent    uses 3L oxygen night/prn   Personal history of radiation therapy    Pneumonia    Pulmonary embolism (HCC) 02/08/2012   S/P RT TOTAL KNEE ON 02/03/12--ON 02/08/12--DEVELOPED ACUTE SOB AND CHEST PAIN--AND DIAGNOSED WITH  PULMONARY EMBOLUS AND PNEUMONIA   Restless leg syndrome    Sleep apnea    Uterine fibroid    NO PROBLEMS AT PRESENT FROM THE FIBROIDS-STATES SHE IS POST MENOPAUSAL-LAST MENSES 2010 EXCEPT FOR EPISODE THIS YR OF BLEEDING RELATED TO FIBROIDS.   Weakness    BOTH HANDS - S/P BILATERAL CARPAL TUNNEL RELEASE--BUT STILL HAS WEAKNESS--OFTEN DROPS THINGS    Past Surgical History:  Procedure Laterality Date   BACK SURGERY  02/11/2021   Dr Otelia Sergeant   BICEPT TENODESIS Right 09/15/2023   Procedure: BICEPS TENODESIS;  Surgeon: Cammy Copa, MD;  Location: [facility] OR;  Service: Orthopedics;  Laterality: Right;   BREAST BIOPSY Right 06/04/2018   BREAST LUMPECTOMY Right 06/2018   BREAST LUMPECTOMY WITH RADIOACTIVE SEED AND SENTINEL LYMPH NODE BIOPSY Right 07/19/2018   Procedure: BREAST LUMPECTOMY WITH RADIOACTIVE SEED AND SENTINEL LYMPH NODE BIOPSY;  Surgeon: Ovidio Kin, MD;  Location: [facility] OR;  Service: General;  Laterality: Right;   CARPAL TUNNEL RELEASE     Bilateral   CHOLECYSTECTOMY     COLONOSCOPY  2010   CORONARY ANGIOPLASTY     2010 has stent in place   CYSTOSCOPY W/ RETROGRADES Right 09/21/2013   Procedure: CYSTOSCOPY WITH RIGHT RETROGRADE PYELOGRAM RIGHT DOUBLE J STENT ;  Surgeon: Antony Haste, MD;  Location: [facility] ORS;  Service: Urology;  Laterality: Right;   CYSTOSCOPY W/ URETERAL STENT PLACEMENT Right 11/16/2022   Procedure: CYSTOSCOPY WITH RETROGRADE PYELOGRAM/URETERAL STENT PLACEMENT;  Surgeon: Despina Arias, MD;  Location: [facility] OR;  Service: Urology;  Laterality: Right;   CYSTOSCOPY WITH RETROGRADE PYELOGRAM, URETEROSCOPY AND STENT PLACEMENT Right 01/13/2023   Procedure: CYSTOSCOPY WITH RETROGRADE PYELOGRAM,  URETEROSCOPY AND STENT EXCHANGE;  Surgeon: Milderd Meager., MD;  Location: [facility];  Service: Urology;  Laterality: Right;   CYSTOSCOPY WITH URETEROSCOPY AND STENT PLACEMENT Right 10/25/2013   Procedure: CYSTOSCOPY RIGHT URETEROSCOPY HOLMIUM LASER LITHO AND STENT PLACEMENT;  Surgeon: Antony Haste, MD;  Location: [facility];  Service: Urology;  Laterality: Right;   HERNIA REPAIR  1995   umbilical ,   HOLMIUM LASER APPLICATION Right 01/13/2023   Procedure: HOLMIUM LASER APPLICATION;  Surgeon: Milderd Meager., MD;  Location: [facility];  Service: Urology;  Laterality: Right;   INTRAOPERATIVE TRANSESOPHAGEAL ECHOCARDIOGRAM N/A 12/12/2014   Procedure: INTRAOPERATIVE TRANSESOPHAGEAL ECHOCARDIOGRAM;  Surgeon: Kathleene Hazel, MD;  Location:  [facility] OR;  Service: Cardiovascular;  Laterality: N/A;   KNEE ARTHROPLASTY  02/03/2012   Procedure: COMPUTER ASSISTED TOTAL KNEE ARTHROPLASTY;  Surgeon: [name] Hitch, MD;  Location: [facility] OR;  Service: Orthopedics;  Laterality: Right;  Right total knee arthroplasty   LEFT AND RIGHT HEART CATHETERIZATION WITH CORONARY ANGIOGRAM N/A 03/17/2013   Procedure: LEFT AND RIGHT HEART CATHETERIZATION WITH CORONARY ANGIOGRAM;  Surgeon: Kathleene Hazel, MD;  Location: [facility] CATH LAB;  Service: Cardiovascular;  Laterality: N/A;   LEFT AND RIGHT HEART CATHETERIZATION WITH CORONARY/GRAFT ANGIOGRAM N/A 09/14/2014   Procedure: LEFT AND RIGHT HEART CATHETERIZATION WITH Isabel Caprice;  Surgeon: Kathleene Hazel, MD;  Location: [facility] CATH LAB;  Service: Cardiovascular;  Laterality: N/A;   REVERSE SHOULDER ARTHROPLASTY Left 03/06/2022   Procedure: LEFT SHOULDER REPLACEMENT APPLICATION OF WOUND VAC;  Surgeon: Cammy Copa, MD;  [facility];  Service: Orthopedics;  Laterality: Left;   REVERSE SHOULDER ARTHROPLASTY Right 09/15/2023   Procedure: REVERSE SHOULDER ARTHROPLASTY;  Surgeon: Cammy Copa, MD;  Location: [facility] OR;  Service:  Orthopedics;  Laterality: Right;   TEE WITHOUT CARDIOVERSION N/A 03/14/2013   Procedure: TRANSESOPHAGEAL ECHOCARDIOGRAM (TEE);  Surgeon: Lewayne Bunting, MD;  Location: [facility] ENDOSCOPY;  Service: Cardiovascular;  Laterality: N/A;   TEE WITHOUT CARDIOVERSION N/A 11/14/2014   Procedure: TRANSESOPHAGEAL ECHOCARDIOGRAM (TEE);  Surgeon: Lewayne Bunting, MD;  Location: [facility] ENDOSCOPY;  Service: Cardiovascular;  Laterality: N/A;   TONSILLECTOMY     maybe as a child- does not know   TOTAL KNEE ARTHROPLASTY  09/10/2012   Procedure: TOTAL KNEE ARTHROPLASTY;  Surgeon: [name] Hitch, MD;  [facility];  Service: Orthopedics;  Laterality: Left;   TOTAL KNEE REVISION Right 07/15/2013   Procedure: REVISION ARTHROPLASTY RIGHT KNEE;  Surgeon: [name] Hitch, MD;  [facility];  Service: Orthopedics;  Laterality: Right;   TRANSCATHETER AORTIC VALVE REPLACEMENT, TRANSFEMORAL N/A 12/12/2014   Procedure: TRANSCATHETER AORTIC VALVE REPLACEMENT, TRANSFEMORAL;  Surgeon: Kathleene Hazel, MD;  [facility]: [facility];  Service: Cardiovascular;  Laterality: N/A;   TRIGGER FINGER RELEASE  09/10/2012   Procedure: RELEASE TRIGGER FINGER/A-1 PULLEY;  Surgeon: [name] Hitch, MD;  [facility]: [facility];  Service: Orthopedics;  Laterality: Right;  Right Ring Finger   TUBAL LIGATION      Family Psychiatric History: Mother alcohol use and brother intellectual delay   Family History:  Family History  Problem Relation Age of Onset   Breast cancer Mother 75 - 20       stage IV at diagnosis   Emphysema Mother        smoked   Heart disease Mother    COPD Father        smoked   Asthma Father    Heart disease Father    Cancer Brother        Sinus   Colon cancer Neg Hx    Esophageal cancer Neg Hx    Inflammatory bowel disease Neg Hx    Liver disease Neg Hx    Pancreatic cancer Neg Hx    Rectal cancer Neg Hx    Stomach cancer Neg Hx     Social History:  Social History   Socioeconomic  History   Marital status: Married    Spouse name: Not on file   Number of children: 2   Years of education: Not on file   Highest education level: Not on file  Occupational History   Occupation: Disabled  Tobacco Use   Smoking status: Former  Current packs/day: 0.00    Average packs/day: 1.5 packs/day for 30.0 years (45.0 ttl pk-yrs)    Types: Cigarettes    Start date: 12/29/1970    Quit date: 12/29/2000    Years since quitting: 22.9   Smokeless tobacco: Never  Vaping Use   Vaping status: Never Used  Substance and Sexual Activity   Alcohol use: Not Currently   Drug use: Not Currently    Types: Marijuana    Comment: CBD oils through vape shops   Sexual activity: Not Currently    Birth control/protection: Surgical, Post-menopausal    Comment: tubal ligation  Other Topics Concern   Not on file  Social History Narrative   Not on file   Social Drivers of Health   Financial Resource Strain: Low Risk  (10/20/2023)   Overall Financial Resource Strain (CARDIA)    Difficulty of Paying Living Expenses: Not very hard  Food Insecurity: No Food Insecurity (12/04/2023)   Hunger Vital Sign    Worried About Running Out of Food in the Last Year: Never true    Ran Out of Food in the Last Year: Never true  Transportation Needs: No Transportation Needs (09/21/2023)   PRAPARE - Administrator, Civil Service (Medical): No    Lack of Transportation (Non-Medical): No  Physical Activity: Inactive (07/21/2023)   Exercise Vital Sign    Days of Exercise per Week: 0 days    Minutes of Exercise per Session: 0 min  Stress: No Stress Concern Present (09/07/2023)   [employer] of Occupational Health - Occupational Stress Questionnaire    Feeling of Stress : Not at all  Social Connections: Moderately Isolated (10/20/2023)   Social Connection and Isolation Panel [NHANES]    Frequency of Communication with Friends and Family: More than three times a week    Frequency of Social Gatherings  with Friends and Family: More than three times a week    Attends Religious Services: Never    Database administrator or Organizations: No    Attends Engineer, structural: Never    Marital Status: Married    Allergies: No Known Allergies  Metabolic Disorder Labs: Lab Results  Component Value Date   HGBA1C 6.6 (A) 12/01/2023   MPG 157 09/09/2023   MPG 143 01/08/2023   No results found for: "PROLACTIN" Lab Results  Component Value Date   CHOL 165 12/25/2022   TRIG 78 12/25/2022   HDL 49 12/25/2022   CHOLHDL 3.4 12/25/2022   VLDL 20 12/28/2015   LDLCALC 101 (H) 12/25/2022   LDLCALC 87 06/04/2022   Lab Results  Component Value Date   TSH 2.820 09/25/2020   TSH 2.918 09/21/2013    Therapeutic Level Labs: No results found for: "LITHIUM" No results found for: "VALPROATE" No results found for: "CBMZ"  Current Medications: Current Outpatient Medications  Medication Sig Dispense Refill   acetaminophen (TYLENOL) 500 MG tablet Take 1,000 mg by mouth every 6 (six) hours as needed for moderate pain.     amLODipine (NORVASC) 5 MG tablet Take 1 tablet (5 mg total) by mouth daily. 90 tablet 2   aspirin EC 81 MG tablet Take 1 tablet (81 mg total) by mouth daily. Swallow whole. 30 tablet 12   atorvastatin (LIPITOR) 80 MG tablet Take 1 tablet (80 mg total) by mouth daily. 90 tablet 2   Blood Pressure Monitoring (BLOOD PRESSURE CUFF) MISC Please follow insert instructions. 1 each 0   busPIRone (BUSPAR) 15 MG tablet Take 1  tablet (15 mg total) by mouth 2 (two) times daily. 60 tablet 3   cephALEXin (KEFLEX) 500 MG capsule Take 1 capsule (500 mg total) by mouth 2 (two) times daily. 14 capsule 0   cholecalciferol (VITAMIN D3) 25 MCG (1000 UNIT) tablet Take 1,000 Units by mouth daily.     clopidogrel (PLAVIX) 75 MG tablet Take 1 tablet (75 mg total) by mouth every morning. 90 tablet 0   Continuous Glucose Receiver (DEXCOM G7 RECEIVER) DEVI 1 Units by Does not apply route daily. 1 each  0   Continuous Glucose Sensor (DEXCOM G7 SENSOR) MISC Place new sensor every 10 days. Use to monitor blood sugar continuously. 9 each 3   Continuous Glucose Sensor (DEXCOM G7 SENSOR) MISC 1 Units by Does not apply route daily. 1 each 0   Continuous Glucose Sensor (FREESTYLE LIBRE 3 SENSOR) MISC Place 1 sensor on the skin every 14 days. Use to check glucose continuously 2 each 11   DULoxetine (CYMBALTA) 20 MG capsule Take 1 capsule (20 mg total) by mouth daily. 30 capsule 3   empagliflozin (JARDIANCE) 10 MG TABS tablet Take 1 tablet (10 mg total) by mouth daily before breakfast. 30 tablet 2   Evolocumab (REPATHA SURECLICK) 140 MG/ML SOAJ Inject 140 mg into the skin every 14 (fourteen) days. 6 mL 3   exemestane (AROMASIN) 25 MG tablet TAKE 1 TABLET BY MOUTH ONCE DAILY AFTER BREAKFAST 90 tablet 3   ezetimibe (ZETIA) 10 MG tablet Take 1 tablet by mouth once daily (Patient taking differently: Take 10 mg by mouth at bedtime.) 90 tablet 2   Fluticasone-Umeclidin-Vilant (TRELEGY ELLIPTA) 100-62.5-25 MCG/ACT AEPB INHALE 1 PUFF ONCE DAILY 180 each 0   furosemide (LASIX) 40 MG tablet Take 1 tablet (40 mg total) by mouth as needed for fluid or edema. 180 tablet 1   gabapentin (NEURONTIN) 300 MG capsule TAKE 1 CAPSULE BY MOUTH IN THE MORNING AND 3 AT BEDTIME 120 capsule 3   hydrocortisone cream 1 % Apply 1 Application topically daily as needed for itching.     losartan-hydrochlorothiazide (HYZAAR) 100-25 MG tablet TAKE 1 TABLET BY MOUTH ONCE DAILY *  PLEASE  REPEAT  KIDNEY  LABS  TO  GET  ADDITIONAL  REFILLS  * 30 tablet 0   metFORMIN (GLUCOPHAGE-XR) 500 MG 24 hr tablet Take 1 tablet (500 mg total) by mouth 2 (two) times daily. 180 tablet 0   methocarbamol (ROBAXIN) 500 MG tablet Take 1 tablet (500 mg total) by mouth every 8 (eight) hours as needed for muscle spasms. 30 tablet 0   metoprolol succinate (TOPROL-XL) 50 MG 24 hr tablet Take 1 tablet (50 mg total) by mouth daily. Take with or immediately following a  meal.TAKE 1 TABLET BY MOUTH ONCE DAILY WITH MEALS OR  IMMEDIATELY  FOLLOWING  A  MEAL 90 tablet 2   montelukast (SINGULAIR) 10 MG tablet TAKE 1 TABLET BY MOUTH AT BEDTIME 90 tablet 1   oxyCODONE (OXY IR/ROXICODONE) 5 MG immediate release tablet Take 1 tablet (5 mg total) by mouth every 6 (six) hours as needed for moderate pain (pain score 4-6). 30 tablet 0   Semaglutide-Weight Management (WEGOVY) 2.4 MG/0.75ML SOAJ Inject 2.4 mg into the skin once a week. 3 mL 1   senna-docusate (SENNA-TIME S) 8.6-50 MG tablet Take 1 tablet by mouth at bedtime as needed for mild constipation. 30 tablet 1   traZODone (DESYREL) 50 MG tablet Take 1-2 tablets (50-100 mg total) by mouth at bedtime. 30 tablet 3  VENTOLIN HFA 108 (90 Base) MCG/ACT inhaler Inhale 2 puffs into the lungs every 6 (six) hours as needed for wheezing or shortness of breath. 18 g 3   No current facility-administered medications for this visit.     Musculoskeletal: Strength & Muscle Tone: within normal limits and Telehealth visit Gait & Station: normal, Telehealth Patient leans: N/A  Psychiatric Specialty Exam: Review of Systems  Last menstrual period 02/03/2012.There is no height or weight on file to calculate BMI.  General Appearance: Well Groomed  Eye Contact:  Good  Speech:  Clear and Coherent and Normal Rate  Volume:  Normal  Mood:  Euthymic  Affect:  Appropriate and Congruent  Thought Process:  Coherent, Goal Directed, and Linear  Orientation:  Full (Time, Place, and Person)  Thought Content: WDL and Logical   Suicidal Thoughts:  No  Homicidal Thoughts:  No  Memory:  Immediate;   Good Recent;   Good Remote;   Good  Judgement:  Good  Insight:  Good  Psychomotor Activity:  Normal  Concentration:  Concentration: Good and Attention Span: Good  Recall:  Good  Fund of Knowledge: Good  Language: Good  Akathisia:  No  Handed:  Right  AIMS (if indicated): not done  Assets:  Communication Skills Desire for  Improvement Financial Resources/Insurance Housing Leisure Time Physical Health Social Support  ADL's:  Intact  Cognition: WNL  Sleep:  Good   Screenings: GAD-7    Flowsheet Row Video Visit from 12/15/2023 in [facility] Video Visit from 07/08/2023 in [facility] Video Visit from 04/29/2023 in [facility] Counselor from 03/05/2023 in [facility] Counselor from 02/16/2023 in [facility]  Total GAD-7 Score 4 3 4 4 2       PHQ2-9    Flowsheet Row Video Visit from 12/15/2023 in [facility] Office Visit from 08/18/2023 in [facility] Of [place] Video Visit from 07/08/2023 in [facility] Office Visit from 06/03/2023 in [facility] - A Dept Of . [facility] Video Visit from 04/29/2023 in [facility]  PHQ-2 Total Score 0 0 0 0 0  PHQ-9 Total Score 1 -- 1 1 0      Flowsheet Row Admission (Discharged) from 09/15/2023 in [facility] Pre-Admission Testing 58 from 09/09/2023 in [facility] PREADMISSION TESTING Admission (Discharged) from 01/13/2023 in [place] PERIOPERATIVE AREA  C-SSRS RISK CATEGORY No Risk No Risk No Risk        Assessment and Plan: Patient reports that her sleep, pain, anxiety and depression are well-managed.  No medication changes made today.  Patient agreeable to continue medication as prescribed.      1. GAD (generalized anxiety disorder)  Continue- busPIRone (BUSPAR) 15 MG tablet; Take 1 tablet (15 mg total) by mouth 2 (two) times daily.  Dispense: 60 tablet; Refill: 3 Continue- DULoxetine (CYMBALTA) 20 MG capsule; Take 1 capsule (20 mg total) by mouth daily.  Dispense: 30 capsule; Refill: 3 Continue- traZODone  (DESYREL) 50 MG tablet; Take 1-2 tablets (50-100 mg total) by mouth at bedtime.  Dispense: 30 tablet; Refill: 3  2. PTSD (post-traumatic stress disorder)  Continue- busPIRone (BUSPAR) 15 MG tablet; Take 1 tablet (15 mg total) by mouth 2 (two) times daily.  Dispense: 60 tablet; Refill: 3  Continue- traZOdone (DESYREL) 50 MG tablet; Take 1-2 tablets (50-100 mg total) by mouth at bedtime.  Dispense: 30 tablet; Refill: 3  3. Mild depression  Continue- busPIRone (BUSPAR) 15 MG tablet; Take 1 tablet (15 mg total) by mouth 2 (two) times daily.  Dispense: 60 tablet; Refill: 3 - traZODone (DESYREL) 50 MG tablet; Take 1-2 tablets (50-100 mg total) by mouth at bedtime.  Dispense: 30 tablet; Refill: 3 Continue- DULoxetine (CYMBALTA) 20 MG capsule; Take 1 capsule (20 mg total) by mouth daily.  Dispense: 30 capsule; Refill: 3    Collaboration of Care: Collaboration of Care: Other provider involved in patient's care AEB PCP and counselor  Patient/Guardian was advised Release of Information must be obtained prior to any record release in order to collaborate their care with an outside provider. Patient/Guardian was advised if they have not already done so to contact the registration department to sign all necessary forms in order for [place] to release information regarding their care.   Consent: Patient/Guardian gives verbal consent for treatment and assignment of benefits for services provided during this visit. Patient/Guardian expressed understanding and agreed to proceed.   Follow-up in 2.5 months Follow-up therapy Shanna Cisco, NP 12/15/2023, 10:52 AM

## 2023-12-30 DIAGNOSIS — Z419 Encounter for procedure for purposes other than remedying health state, unspecified: Secondary | ICD-10-CM | POA: Diagnosis not present

## 2023-12-31 ENCOUNTER — Other Ambulatory Visit: Payer: Self-pay | Admitting: Surgical

## 2023-12-31 DIAGNOSIS — H04123 Dry eye syndrome of bilateral lacrimal glands: Secondary | ICD-10-CM | POA: Diagnosis not present

## 2023-12-31 DIAGNOSIS — E119 Type 2 diabetes mellitus without complications: Secondary | ICD-10-CM | POA: Diagnosis not present

## 2023-12-31 DIAGNOSIS — H0102B Squamous blepharitis left eye, upper and lower eyelids: Secondary | ICD-10-CM | POA: Diagnosis not present

## 2023-12-31 DIAGNOSIS — H2513 Age-related nuclear cataract, bilateral: Secondary | ICD-10-CM | POA: Diagnosis not present

## 2023-12-31 DIAGNOSIS — H0102A Squamous blepharitis right eye, upper and lower eyelids: Secondary | ICD-10-CM | POA: Diagnosis not present

## 2023-12-31 LAB — HM DIABETES EYE EXAM

## 2024-01-04 ENCOUNTER — Other Ambulatory Visit: Payer: Self-pay

## 2024-01-05 ENCOUNTER — Other Ambulatory Visit: Payer: Self-pay

## 2024-01-05 MED ORDER — MONTELUKAST SODIUM 10 MG PO TABS: 10.0000 mg | ORAL_TABLET | Freq: Every day | ORAL | 1 refills | Status: DC

## 2024-01-05 NOTE — Telephone Encounter (Signed)
 Open error

## 2024-01-07 ENCOUNTER — Other Ambulatory Visit: Payer: Self-pay | Admitting: Student

## 2024-01-07 DIAGNOSIS — J441 Chronic obstructive pulmonary disease with (acute) exacerbation: Secondary | ICD-10-CM

## 2024-01-13 ENCOUNTER — Other Ambulatory Visit: Payer: Self-pay

## 2024-01-13 DIAGNOSIS — J441 Chronic obstructive pulmonary disease with (acute) exacerbation: Secondary | ICD-10-CM

## 2024-01-13 MED ORDER — TRELEGY ELLIPTA 100-62.5-25 MCG/ACT IN AEPB: INHALATION_SPRAY | RESPIRATORY_TRACT | 0 refills | Status: DC

## 2024-01-18 ENCOUNTER — Other Ambulatory Visit: Payer: Self-pay | Admitting: Student

## 2024-01-18 ENCOUNTER — Other Ambulatory Visit (INDEPENDENT_AMBULATORY_CARE_PROVIDER_SITE_OTHER): Payer: Self-pay

## 2024-01-18 ENCOUNTER — Ambulatory Visit (INDEPENDENT_AMBULATORY_CARE_PROVIDER_SITE_OTHER): Payer: Medicaid Other | Admitting: Surgical

## 2024-01-18 ENCOUNTER — Other Ambulatory Visit: Payer: Self-pay | Admitting: Obstetrics and Gynecology

## 2024-01-18 ENCOUNTER — Telehealth: Payer: Medicaid Other | Admitting: Physician Assistant

## 2024-01-18 ENCOUNTER — Encounter: Payer: Self-pay | Admitting: Surgical

## 2024-01-18 DIAGNOSIS — N760 Acute vaginitis: Secondary | ICD-10-CM

## 2024-01-18 DIAGNOSIS — M25562 Pain in left knee: Secondary | ICD-10-CM | POA: Diagnosis not present

## 2024-01-18 DIAGNOSIS — T887XXA Unspecified adverse effect of drug or medicament, initial encounter: Secondary | ICD-10-CM

## 2024-01-18 DIAGNOSIS — I152 Hypertension secondary to endocrine disorders: Secondary | ICD-10-CM

## 2024-01-18 NOTE — Patient Instructions (Signed)
Hi [name], follow up with PCP today about symptoms, have a nice day!  [name] was given information about Medicaid Managed Care team care coordination services as a part of their [facility] Medicaid benefit. [name] verbally consented to engagement with the [facility] Managed Care team.   If you are experiencing a medical emergency, please call 911 or report to your local emergency department or urgent care.   If you have a non-emergency medical problem during routine business hours, please contact your provider's office and ask to speak with a nurse.   For questions related to your [facility] health plan, please call: [phone] or go here:[email or web address]  If you would like to schedule transportation through your [facility] plan, please call the following number at least 2 days in advance of your appointment: [phone].   You can also use the MTM portal or MTM mobile app to manage your rides. Reimbursement for transportation is available through [facility]! For the portal, please go to [email or web address]  Call the [facility] Crisis Line at [phone], at any time, 24 hours a day, 7 days a week. If you are in danger or need immediate medical attention call 911.  If you would like help to quit smoking, call 1-800-QUIT-NOW (2343667767) OR Espaol: 1-855-Djelo-Ya (3-474-259-5638) o para ms informacin haga clic aqu or Text READY to 756-433 to register via text  [name] - following are the goals we discussed in your visit today:   Goals Addressed   None   Timeframe:  Long-Range Goal Priority:  High Start Date:      04/14/22                       Expected End Date:     ongoing                  Follow Up Date: 02/18/24   - schedule appointment for flu shot - schedule appointment for vaccines needed due to my age or health - schedule recommended health tests (blood work, mammogram, colonoscopy, pap test) - schedule and keep  appointment for annual check-up    Why is this important?   Screening tests can find diseases early when they are easier to treat.  Your doctor or nurse will talk with you about which tests are important for you.  Getting shots for common diseases like the flu and shingles will help prevent them.  01/18/24:  MMG completed.  ORTHO appt this week.  CARDS 1/31.  Patient verbalizes understanding of instructions and care plan provided today and agrees to view in MyChart. Active MyChart status and patient understanding of how to access instructions and care plan via MyChart confirmed with patient.     The Managed Medicaid care management team will [name] out to the patient again over the next 30 business  days.  The  Patient has been provided with contact information for the Managed Medicaid care management team and has been advised to call with any health related questions or concerns.   [name] RN, BSN, [employer] [facility] Health RN Care Manager Direct Dial [phone]/AYT [phone] Website: [email or web address]  Following is a copy of your plan of care:  Care Plan : RN Care Manager Plan of Care  Updates made by [name], RN since 01/18/2024 12:00 AM     Problem: Health Promotion or Disease Self-Management (General Plan of Care)   Priority: High  Onset Date: 04/14/2022     [facility]  Goal: Chronic Disease Management and Care Coordination Needs   Start Date: 04/14/2022  Expected End Date: 04/17/2024  Recent Progress: Not on track  Priority: High  Note:   Current Barriers:  Knowledge Deficits related to plan of care for management of Anxiety and Depression along with other chronic health conditions, HTN, DM, HF, asthma, COPD, OSA, arthritis, GERD, CAD, HLD, h/o breast cancer, RLS Chronic Disease Management support and education needs related to Anxiety and Depression. DM, HTN, HF, asthma, OSA, arthritis, GERD, CAD, HLD, h/o breast cancer, RLS. 01/18/24:  No BP  reading available today. Blood sugar 160.  On Wegovy and Jardiance-complains of UTI and yeast infection symptoms since starting these meds-to follow up with PCP today.  Dizziness has improved.  Having left knee pain and is following up with ORTHO today.  Brathing WNL.  RNCM Clinical Goal(s):  Patient will verbalize understanding of plan for management of Anxiety and Depression, DM, HTN  as evidenced by patient report take all medications exactly as prescribed and will call provider for medication related questions as evidenced by patient report demonstrate understanding of rationale for each prescribed medication as evidenced by patient report attend all scheduled medical appointments  as evidenced by patient report continue to work with RN Care Manager to address care management and care coordination needs related to  Anxiety and Depression, DM, HTN as evidenced by adherence to CM Team Scheduled appointments work with Child psychotherapist to address  related to the management of Mental Health Concerns  related to the management of Anxiety and Depression as evidenced by review of EMR and patient or Child psychotherapist report through collaboration with Medical illustrator, provider, and care team.   Interventions: Inter-disciplinary care team collaboration (see longitudinal plan of care) Evaluation of current treatment plan related to  self management and patient's adherence to plan as established by provider   (Status:  New goal.)  Long Term Goal Evaluation of current treatment plan related to Anxiety and Depression, Mental Health Concerns  self-management and patient's adherence to plan as established by provider. Discussed plans with patient for ongoing care management follow up and provided patient with direct contact information for care management team Advised patient to contact provider for any additional DME needs, CBG, and possible general surgeon referral. Reviewed medications with patient Collaborated  with LCSW regarding anxiety/depression Reviewed scheduled/upcoming provider appointments Advised patient, providing education and rationale, to check cbg and record, calling provider  for findings outside established parameters  Social Work referral for anxiety/depression Discussed plans with patient for ongoing care management follow up and provided patient with direct contact information for care management team Assessed social determinant of health barriers Collaborated with Lubertha Sayres Managed Medicaid Liaison regarding DME-motorized wheelchair and lift chair. LCSW appointment rescheduled. Collaborated with Pharmacist. Pharmacy referral for polypharmacy at patient's request-completed  Asthma: (Status:New goal.) Long Term Goal Advised patient to track and manage Asthma triggers Advised patient to engage in light exercise as tolerated 3-5 days a week to aid in the the management of Asthma Provided education about and advised patient to utilize infection prevention strategies to reduce risk of respiratory infection Discussed the importance of adequate rest and management of fatigue with Asthma Assessed social determinant of health barriers   CAD Interventions: (Status:  New goal.) Long Term Goal Assessed understanding of CAD diagnosis Medications reviewed including medications utilized in CAD treatment plan Provided education on importance of blood pressure control in management of CAD Provided education on Importance of limiting foods high in cholesterol  Counseled on importance of regular laboratory monitoring as prescribed Counseled on the importance of exercise goals with target of 150 minutes per week Reviewed Importance of taking all medications as prescribed Reviewed Importance of attending all scheduled provider appointments Advised to report any changes in symptoms or exercise tolerance Assessed social determinant of health barriers  Heart Failure Interventions:   (Status:  New goal.) Long Term Goal Basic overview and discussion of pathophysiology of Heart Failure reviewed Provided education on low sodium diet Assessed need for readable accurate scales in home Provided education about placing scale on hard, flat surface Advised patient to weigh each morning after emptying bladder Discussed importance of daily weight and advised patient to weigh and record daily Discussed the importance of keeping all appointments with provider Provided patient with education about the role of exercise in the management of heart failure Assessed social determinant of health barriers   COPD Interventions:  (Status:  New goal.) Long Term Goal Advised patient to track and manage COPD triggers Advised patient to self assesses COPD action plan zone and make appointment with provider if in the yellow zone for 48 hours without improvement Advised patient to engage in light exercise as tolerated 3-5 days a week to aid in the the management of COPD Provided education about and advised patient to utilize infection prevention strategies to reduce risk of respiratory infection Discussed the importance of adequate rest and management of fatigue with COPD Assessed social determinant of health barriers  Hyperlipidemia Interventions:  (Status:  New goal.) Long Term Goal Medication review performed; medication list updated in electronic medical record.  Provider established cholesterol goals reviewed Counseled on importance of regular laboratory monitoring as prescribed Reviewed importance of limiting foods high in cholesterol Reviewed exercise goals and target of 150 minutes per week Assessed social determinant of health barriers    Diabetes Interventions:  (Status:  New goal.) Long Term Goal Assessed patient's understanding of A1c goal: <7% Reviewed medications with patient and discussed importance of medication adherence Reviewed scheduled/upcoming provider appointments   Advised patient, providing education and rationale, to check cbg and record, calling provider for findings outside established parameters Review of patient status, including review of consultants reports, relevant laboratory and other test results, and medications completed Assessed social determinant of health barriers Lab Results  Component Value Date   HGBA1C 6.6 12/01/23   HGBA1C   7.0 on 08/18/23 HGBA1C  7.1 on 09/09/23  Hypertension Interventions:  (Status:  New goal.) Long Term Goal Last practice recorded BP readings:  BP Readings from Last 3 Encounters:        08/18/23         153/74 10/20/23         114/74 12/01/23         124/63  Most recent eGFR/CrCl:  Lab Results  Component Value Date   EGFR 90 06/04/2022    No components found for: "CRCL"  Evaluation of current treatment plan related to hypertension self management and patient's adherence to plan as established by provider Reviewed medications with patient and discussed importance of compliance Discussed plans with patient for ongoing care management follow up and provided patient with direct contact information for care management team Advised patient, providing education and rationale, to monitor blood pressure daily and record, calling PCP for findings outside established parameters Reviewed scheduled/upcoming provider appointments including:  Assessed social determinant of health barriers   Patient Goals/Self-Care Activities: Take all medications as prescribed Attend all scheduled provider appointments Call pharmacy for medication  refills 3-7 days in advance of running out of medications Perform all self care activities independently  Perform IADL's (shopping, preparing meals, housekeeping, managing finances) independently Call provider office for new concerns or questions  Work with the social worker to address care coordination needs and will continue to work with the clinical team to address health care and  disease management related needs Patient to f/u with provider regarding sciatica pain-completed 01/18/24:  patient to schedule an appt with PCP  Follow Up Plan:  The patient has been provided with contact information for the care management team and has been advised to call with any health related questions or concerns.  The care management team will [name] out to the patient again over the next 60 business  days.

## 2024-01-18 NOTE — Progress Notes (Signed)
No Charge.  Patient questioning stopping a chronic medication for diabetes (Jardiance) stating it is causing frequent urinary and vaginal symptoms.

## 2024-01-18 NOTE — Patient Outreach (Signed)
Medicaid Managed Care   Nurse Care Manager Note  01/18/2024 Name:  [name] MRN:  [id number] DOB:  01/22/1961  [name] is an 63 y.o. year old female who is a primary patient of Philomena Doheny, MD.  The Medicaid Managed Care Coordination team was consulted for assistance with:    Chronic healthcare management needs, GERD, OSA, HF, CAD, HLD, RLS, COPD, asthma, h/o breast cancer, sciatica, neuropathy, anxiety/depression/PTSD, HTN, DM  [name] was given information about Medicaid Managed Care Coordination team services today. [name] Patient agreed to services and verbal consent obtained.  Engaged with patient by telephone for follow up visit in response to provider referral for case management and/or care coordination services.   Patient is participating in a Managed Medicaid Plan:  Yes  Assessments/Interventions:  Review of past medical history, allergies, medications, health status, including review of consultants reports, laboratory and other test data, was performed as part of comprehensive evaluation and provision of chronic care management services.  SDOH (Social Drivers of Health) assessments and interventions performed: SDOH Interventions    Flowsheet Row Patient Outreach Telephone from 01/18/2024 in [place] [facility] DEPART[facility] Patient Outreach Telephone from 12/04/2023 in [place] POPULATION HEALTH DEPARTMENT Patient Outreach Telephone from 10/20/2023 in [place] POPULATION HEALTH DEPARTMENT Patient Outreach Telephone from 09/21/2023 in [place] POPULATION HEALTH DEPARTMENT Patient Outreach Telephone from 09/07/2023 in [place] POPULATION HEALTH DEPARTMENT Patient Outreach Telephone from 08/21/2023 in [place] POPULATION HEALTH DEPARTMENT  SDOH Interventions        Food Insecurity Interventions -- Intervention Not Indicated -- -- -- --  Housing Interventions -- Intervention Not Indicated -- -- -- --  Transportation  Interventions -- -- -- Intervention Not Indicated -- --  Utilities Interventions -- -- -- Intervention Not Indicated -- --  Alcohol Usage Interventions -- -- -- -- -- Intervention Not Indicated (Score <7)  Financial Strain Interventions -- -- Intervention Not Indicated -- -- --  Physical Activity Interventions Other (Comments)  [unable to participate in this type of exercise] -- -- -- -- --  Stress Interventions -- -- -- -- [employer], Provide Counseling --  Social Connections Interventions -- -- Intervention Not Indicated -- -- --  Health Literacy Interventions Intervention Not Indicated -- -- -- -- --     Care Plan No Known Allergies  Medications Reviewed Today     Reviewed by [name], RN (Registered Nurse) on 01/18/24 at 1039  Med List Status: <None>   Medication Order Taking? Sig Documenting Provider Last Dose Status Informant  acetaminophen (TYLENOL) 500 MG tablet [id number] No Take 1,000 mg by mouth every 6 (six) hours as needed for moderate pain. [provider] Taking Active Self  amLODipine (NORVASC) 5 MG tablet [id number] No Take 1 tablet (5 mg total) by mouth daily. Evlyn Kanner, MD Taking Active Self  aspirin EC 81 MG tablet [id number] No Take 1 tablet (81 mg total) by mouth daily. Swallow whole. Magnant, Joycie Peek, PA-C Taking Active   atorvastatin (LIPITOR) 80 MG tablet [id number] No Take 1 tablet (80 mg total) by mouth daily. Evlyn Kanner, MD Taking Active Self  Blood Pressure Monitoring (BLOOD PRESSURE CUFF) MISC [id number] No Please follow insert instructions. Philomena Doheny, MD Taking Active Self  busPIRone (BUSPAR) 15 MG tablet [id number]  Take 1 tablet (15 mg total) by mouth 2 (two) times daily. Shanna Cisco, NP  Active   cephALEXin (KEFLEX) 500 MG capsule [id number]  Take 1 capsule (  500 mg total) by mouth 2 (two) times daily. Joycelyn Man M, PA-C  Active   cholecalciferol (VITAMIN D3) 25 MCG (1000 UNIT)  tablet [id number] No Take 1,000 Units by mouth daily. [provider] Taking Active Self  clopidogrel (PLAVIX) 75 MG tablet [id number] No Take 1 tablet (75 mg total) by mouth every morning. Kathleene Hazel, MD Taking Active   Continuous Glucose Receiver [facility] G7 RECEIVER) [place] [id number] No 1 Units by Does not apply route daily. Faith Rogue, DO Taking Active   Continuous Glucose Sensor (DEXCOM G7 SENSOR) MISC [id number] No Place new sensor every 10 days. Use to monitor blood sugar continuously. Modena Slater, DO Taking Active   Continuous Glucose Sensor (DEXCOM G7 SENSOR) MISC [id number] No 1 Units by Does not apply route daily. Faith Rogue, DO Taking Active   Continuous Glucose Sensor (FREESTYLE LIBRE 3 SENSOR) [place] [id number] No Place 1 sensor on the skin every 14 days. Use to check glucose continuously Reymundo Poll, MD Taking Active Self  DULoxetine (CYMBALTA) 20 MG capsule [id number]  Take 1 capsule (20 mg total) by mouth daily. Shanna Cisco, NP  Active   empagliflozin (JARDIANCE) 10 MG TABS tablet [id number]  Take 1 tablet (10 mg total) by mouth daily before breakfast. Annett Fabian, MD  Active   Evolocumab [facility] SURECLICK) 140 MG/ML Ivory Broad [id number] No Inject 140 mg into the skin every 14 (fourteen) days. Reymundo Poll, MD Taking Active Self  exemestane (AROMASIN) 25 MG tablet [id number] No TAKE 1 TABLET BY MOUTH ONCE DAILY AFTER Philippa Sicks, MD Taking Active Self  ezetimibe (ZETIA) 10 MG tablet [id number] No Take 1 tablet by mouth once daily  Patient taking differently: Take 10 mg by mouth at bedtime.   Swinyer, Zachary George, NP Taking Active Self  Discontinued 05/09/20 1429   Fluticasone-Umeclidin-Vilant (TRELEGY ELLIPTA) 100-62.5-25 MCG/ACT AEPB [id number]  INHALE 1 PUFF ONCE DAILY Philomena Doheny, MD  Active   furosemide (LASIX) 40 MG tablet [id number] No Take 1 tablet (40 mg total) by mouth as needed for fluid or edema. Chauncey Mann, DO  Taking Active Self  gabapentin (NEURONTIN) 300 MG capsule [id number]  TAKE 1 CAPSULE BY MOUTH IN THE MORNING AND 3 AT BEDTIME Colbert Coyer, Priscila, MD  Active   hydrocortisone cream 1 % [id number] No Apply 1 Application topically daily as needed for itching. [provider] Taking Active Self  losartan-hydrochlorothiazide (HYZAAR) 100-25 MG tablet [id number]  TAKE 1 TABLET BY MOUTH ONCE DAILY *  PLEASE  REPEAT  KIDNEY  LABS  TO  GET  ADDITIONAL  REFILLS  * Philomena Doheny, MD  Active   metFORMIN (GLUCOPHAGE-XR) 500 MG 24 hr tablet [id number] No Take 1 tablet (500 mg total) by mouth 2 (two) times daily. Reymundo Poll, MD Taking Active Self  methocarbamol (ROBAXIN) 500 MG tablet [id number] No Take 1 tablet (500 mg total) by mouth every 8 (eight) hours as needed for muscle spasms. Magnant, Joycie Peek, PA-C Taking Active   metoprolol succinate (TOPROL-XL) 50 MG 24 hr tablet [id number] No Take 1 tablet (50 mg total) by mouth daily. Take with or immediately following a meal.TAKE 1 TABLET BY MOUTH ONCE DAILY WITH MEALS OR  IMMEDIATELY  FOLLOWING  A  MEAL Colbert Coyer, Priscila, MD Taking Active   montelukast (SINGULAIR) 10 MG tablet [id number]  Take 1 tablet (10 mg total) by mouth at bedtime. Philomena Doheny, MD  Active   oxyCODONE (OXY IR/ROXICODONE) 5 MG immediate release tablet [id number] No Take 1  tablet (5 mg total) by mouth every 6 (six) hours as needed for moderate pain (pain score 4-6). Magnant, Joycie Peek, PA-C Taking Active   Semaglutide-Weight Management (WEGOVY) 2.4 MG/0.75ML SOAJ [id number]  Inject 2.4 mg into the skin once a week. Annett Fabian, MD  Active   senna-docusate (SENNA-TIME S) 8.6-50 MG tablet [id number] No Take 1 tablet by mouth at bedtime as needed for mild constipation. Briscoe Burns, MD Taking Active Self           Med Note Gunnar Fusi, MELISSA R   Thu Aug 27, 2023  1:05 PM) ONLY AS NEEDED   traZODone (DESYREL) 50 MG tablet [id number]  Take 1-2 tablets  (50-100 mg total) by mouth at bedtime. Shanna Cisco, NP  Active   VENTOLIN HFA 108 (90 Base) MCG/ACT inhaler [id number] No Inhale 2 puffs into the lungs every 6 (six) hours as needed for wheezing or shortness of breath. Faith Rogue, DO Taking Active Self            Patient Active Problem List   Diagnosis Date Noted   Biceps tendonitis on right 09/19/2023   OA (osteoarthritis) of shoulder 09/15/2023   S/P reverse total shoulder arthroplasty, right 09/15/2023   Chronic constipation 08/15/2023   History of iron deficiency 08/15/2023   Antiplatelet or antithrombotic long-term use 08/15/2023   Abdominal hernia 03/19/2023   Diabetic neuropathy (HCC) 08/29/2022   Vaginal lesion 08/29/2022   PTSD (post-traumatic stress disorder) 06/24/2022   GAD (generalized anxiety disorder) 06/24/2022   Major depressive disorder, recurrent episode, moderate (HCC) 06/24/2022   Urinary incontinence 06/20/2022   Intertrigo 06/20/2022   Arthritis of left shoulder region    S/P reverse total shoulder arthroplasty, 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   Healthcare maintenance 11/17/2021   Spondylolisthesis, lumbar region 02/11/2021    Class: Acute   Malignant neoplasm of upper-outer quadrant of right breast in female, estrogen receptor positive (HCC) 06/10/2018   Restless leg syndrome 02/04/2018   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   Depression 08/06/2015   Morbid obesity (HCC)    Hypertension associated with diabetes (HCC)    Aortic stenosis s/p TAVR    Chronic diastolic congestive heart failure (HCC)    COPD (chronic obstructive pulmonary disease) (HCC) 09/21/2013   Obstructive sleep apnea 09/21/2013   Nephrolithiasis 09/21/2013   Nocturnal hypoxemia 09/12/2013   Hyperlipidemia 11/30/2012   History of pulmonary embolus (PE) 07/09/2012   Normocytic anemia 02/08/2012   Type 2 diabetes mellitus without complication, without long-term current use of insulin (HCC) 02/08/2012   Asthma 02/08/2012    GERD (gastroesophageal reflux disease)    CAD (coronary artery disease) 12/08/2011   Conditions to be addressed/monitored per PCP order:  Chronic healthcare management needs, GERD, OSA, HF, CAD, HLD, RLS, COPD, asthma, h/o breast cancer, sciatica, neuropathy, anxiety/depression/PTSD, HTN, DM  Care Plan : RN Care Manager Plan of Care  Updates made by [name], RN since 01/18/2024 12:00 AM     Problem: Health Promotion or Disease Self-Management (General Plan of Care)   Priority: High  Onset Date: 04/14/2022     Long-Range Goal: Chronic Disease Management and Care Coordination Needs   Start Date: 04/14/2022  Expected End Date: 04/17/2024  Recent Progress: Not on track  Priority: High  Note:   Current Barriers:  Knowledge Deficits related to plan of care for management of Anxiety and Depression along with other chronic health conditions, HTN, DM, HF, asthma, COPD, OSA, arthritis, GERD, CAD, HLD, h/o breast cancer, RLS Chronic Disease Management support  and education needs related to Anxiety and Depression. DM, HTN, HF, asthma, OSA, arthritis, GERD, CAD, HLD, h/o breast cancer, RLS. 01/18/24:  No BP reading available today. Blood sugar 160.  On Wegovy and Jardiance-complains of UTI and yeast infection symptoms since starting these meds-to follow up with PCP today.  Dizziness has improved.  Having left knee pain and is following up with ORTHO today.  Brathing WNL.  RNCM Clinical Goal(s):  Patient will verbalize understanding of plan for management of Anxiety and Depression, DM, HTN  as evidenced by patient report take all medications exactly as prescribed and will call provider for medication related questions as evidenced by patient report demonstrate understanding of rationale for each prescribed medication as evidenced by patient report attend all scheduled medical appointments  as evidenced by patient report continue to work with RN Care Manager to address care management and care  coordination needs related to  Anxiety and Depression, DM, HTN as evidenced by adherence to CM Team Scheduled appointments work with Child psychotherapist to address  related to the management of Mental Health Concerns  related to the management of Anxiety and Depression as evidenced by review of EMR and patient or Child psychotherapist report through collaboration with Medical illustrator, provider, and care team.   Interventions: Inter-disciplinary care team collaboration (see longitudinal plan of care) Evaluation of current treatment plan related to  self management and patient's adherence to plan as established by provider   (Status:  New goal.)  Long Term Goal Evaluation of current treatment plan related to Anxiety and Depression, Mental Health Concerns  self-management and patient's adherence to plan as established by provider. Discussed plans with patient for ongoing care management follow up and provided patient with direct contact information for care management team Advised patient to contact provider for any additional DME needs, CBG, and possible general surgeon referral. Reviewed medications with patient Collaborated with LCSW regarding anxiety/depression Reviewed scheduled/upcoming provider appointments Advised patient, providing education and rationale, to check cbg and record, calling provider  for findings outside established parameters  Social Work referral for anxiety/depression Discussed plans with patient for ongoing care management follow up and provided patient with direct contact information for care management team Assessed social determinant of health barriers Collaborated with Lubertha Sayres Managed Medicaid Liaison regarding DME-motorized wheelchair and lift chair. LCSW appointment rescheduled. Collaborated with Pharmacist. Pharmacy referral for polypharmacy at patient's request-completed  Asthma: (Status:New goal.) Long Term Goal Advised patient to track and manage Asthma  triggers Advised patient to engage in light exercise as tolerated 3-5 days a week to aid in the the management of Asthma Provided education about and advised patient to utilize infection prevention strategies to reduce risk of respiratory infection Discussed the importance of adequate rest and management of fatigue with Asthma Assessed social determinant of health barriers   CAD Interventions: (Status:  New goal.) Long Term Goal Assessed understanding of CAD diagnosis Medications reviewed including medications utilized in CAD treatment plan Provided education on importance of blood pressure control in management of CAD Provided education on Importance of limiting foods high in cholesterol Counseled on importance of regular laboratory monitoring as prescribed Counseled on the importance of exercise goals with target of 150 minutes per week Reviewed Importance of taking all medications as prescribed Reviewed Importance of attending all scheduled provider appointments Advised to report any changes in symptoms or exercise tolerance Assessed social determinant of health barriers  Heart Failure Interventions:  (Status:  New goal.) Long Term Goal Basic overview and discussion  of pathophysiology of Heart Failure reviewed Provided education on low sodium diet Assessed need for readable accurate scales in home Provided education about placing scale on hard, flat surface Advised patient to weigh each morning after emptying bladder Discussed importance of daily weight and advised patient to weigh and record daily Discussed the importance of keeping all appointments with provider Provided patient with education about the role of exercise in the management of heart failure Assessed social determinant of health barriers   COPD Interventions:  (Status:  New goal.) Long Term Goal Advised patient to track and manage COPD triggers Advised patient to self assesses COPD action plan zone and make  appointment with provider if in the yellow zone for 48 hours without improvement Advised patient to engage in light exercise as tolerated 3-5 days a week to aid in the the management of COPD Provided education about and advised patient to utilize infection prevention strategies to reduce risk of respiratory infection Discussed the importance of adequate rest and management of fatigue with COPD Assessed social determinant of health barriers  Hyperlipidemia Interventions:  (Status:  New goal.) Long Term Goal Medication review performed; medication list updated in electronic medical record.  Provider established cholesterol goals reviewed Counseled on importance of regular laboratory monitoring as prescribed Reviewed importance of limiting foods high in cholesterol Reviewed exercise goals and target of 150 minutes per week Assessed social determinant of health barriers    Diabetes Interventions:  (Status:  New goal.) Long Term Goal Assessed patient's understanding of A1c goal: <7% Reviewed medications with patient and discussed importance of medication adherence Reviewed scheduled/upcoming provider appointments  Advised patient, providing education and rationale, to check cbg and record, calling provider for findings outside established parameters Review of patient status, including review of consultants reports, relevant laboratory and other test results, and medications completed Assessed social determinant of health barriers Lab Results  Component Value Date   HGBA1C 6.6 12/01/23   HGBA1C   7.0 on 08/18/23 HGBA1C  7.1 on 09/09/23  Hypertension Interventions:  (Status:  New goal.) Long Term Goal Last practice recorded BP readings:  BP Readings from Last 3 Encounters:        08/18/23         153/74 10/20/23         114/74 12/01/23         124/63  Most recent eGFR/CrCl:  Lab Results  Component Value Date   EGFR 90 06/04/2022    No components found for: "CRCL"  Evaluation of  current treatment plan related to hypertension self management and patient's adherence to plan as established by provider Reviewed medications with patient and discussed importance of compliance Discussed plans with patient for ongoing care management follow up and provided patient with direct contact information for care management team Advised patient, providing education and rationale, to monitor blood pressure daily and record, calling PCP for findings outside established parameters Reviewed scheduled/upcoming provider appointments including:  Assessed social determinant of health barriers   Patient Goals/Self-Care Activities: Take all medications as prescribed Attend all scheduled provider appointments Call pharmacy for medication refills 3-7 days in advance of running out of medications Perform all self care activities independently  Perform IADL's (shopping, preparing meals, housekeeping, managing finances) independently Call provider office for new concerns or questions  Work with the social worker to address care coordination needs and will continue to work with the clinical team to address health care and disease management related needs Patient to f/u with provider regarding sciatica pain-completed  01/18/24:  patient to schedule an appt with PCP  Follow Up Plan:  The patient has been provided with contact information for the care management team and has been advised to call with any health related questions or concerns.  The care management team will reach out to the patient again over the next 60 business  days.    Long-Range Goal: Establish Plan of Care for Chronic Disease Management Needs   Priority: High  Note:   Timeframe:  Long-Range Goal Priority:  High Start Date:      04/14/22                       Expected End Date:     ongoing                  Follow Up Date: 02/18/24   - schedule appointment for flu shot - schedule appointment for vaccines needed due to my age or  health - schedule recommended health tests (blood work, mammogram, colonoscopy, pap test) - schedule and keep appointment for annual check-up    Why is this important?   Screening tests can find diseases early when they are easier to treat.  Your doctor or nurse will talk with you about which tests are important for you.  Getting shots for common diseases like the flu and shingles will help prevent them.  01/18/24:  MMG completed.  ORTHO appt this week.  CARDS 1/31.   Follow Up:  Patient agrees to Care Plan and Follow-up.  Plan: The Managed Medicaid care management team will reach out to the patient again over the next 30 business  days. and The  Patient has been provided with contact information for the Managed Medicaid care management team and has been advised to call with any health related questions or concerns.  Date/time of next scheduled RN care management/care coordination outreach: 02/18/24 at 1030.

## 2024-01-18 NOTE — Progress Notes (Signed)
Office Visit Note   Patient: [name]           Date of Birth: 08/27/1961           MRN: [id number] Visit Date: 01/18/2024 Requested by: Philomena Doheny, MD [address] [place],  [place] [address] PCP: Colbert Coyer, Alyson Locket, MD  Subjective: Chief Complaint  Patient presents with   Left Knee - Pain    HPI: [name] is a 63 y.o. female who presents to the office reporting left knee pain.  Patient states that about 2 weeks ago she stood up from a chair and felt a popping sensation in her left knee.  She has history of left total knee arthroplasty by Dr. Magnus Ivan that was done around 2013 along with right knee revision arthroplasty done around the same timeframe.  Has had intermittent popping and what sounds like patellar instability at times after surgery.  She describes primarily lateral sided pain in the left knee and feels like her patella dislocated again.  Has had minimal improvement since initial event about 2 weeks ago.  No new groin pain or radicular pain.  Does deal with her typical sciatic pain.  This is not any worse.  No fevers or chills or change in the appearance of the incision.  She is ambulatory walking with a cane mostly just for balance..                ROS: All systems reviewed are negative as they relate to the chief complaint within the history of present illness.  Patient denies fevers or chills.  Assessment & Plan: Visit Diagnoses:  1. Acute pain of left knee     Plan: Patient is a 63 year old female who presents for evaluation of left knee pain.  Has history of left total knee arthroplasty by Dr. Magnus Ivan around 2013.  Had incident 1 to 2 weeks ago where she stood up and had popping sensation in the left knee consistent with her history of prior patellar instability events.  She does have radiographs taken today demonstrating lateral subluxation of the patella.  We discussed options available to patient.  She would like to try knee  brace to see if this will help provide some stability for her knee.  We also discussed the possibility of therapy exercises in order to optimize her quad strength and prevent future patellar instability episodes.  She will stick with just the brace and see how this goes.  Recommended she make an appointment to see Dr. Magnus Ivan in 3 to 4 weeks for clinical recheck if pain does not improve as she gets further out from injury.  Follow-Up Instructions: No follow-ups on file.   Orders:  Orders Placed This Encounter  Procedures   XR KNEE 3 VIEW LEFT   No orders of the defined types were placed in this encounter.     Procedures: No procedures performed   Clinical Data: No additional findings.  Objective: Vital Signs: LMP 02/03/2012   Physical Exam:  Constitutional: Patient appears well-developed HEENT:  Head: Normocephalic Eyes:EOM are normal Neck: Normal range of motion Cardiovascular: Normal rate Pulmonary/chest: Effort normal Neurologic: Patient is alert Skin: Skin is warm Psychiatric: Patient has normal mood and affect  Ortho Exam: Ortho exam demonstrates left knee with trace effusion.  Incision is well-healed.  No cellulitis or skin changes noted.  No sinus tract noted.  She has pain reproduced with patellar side-to-side mobility and weakly positive patellar apprehension test.  There is  no ecchymosis noted.  No calf tenderness.  She is able to perform straight leg raise with excellent quad strength.  No restriction to her range of motion compared with contralateral side.  There is no pain with passive motion of the knee.  No pain with hip range of motion.  Specialty Comments:  No specialty comments available.  Imaging: No results found.   PMFS History: Patient Active Problem List   Diagnosis Date Noted   Biceps tendonitis on right 09/19/2023   OA (osteoarthritis) of shoulder 09/15/2023   S/P reverse total shoulder arthroplasty, right 09/15/2023   Chronic constipation  08/15/2023   History of iron deficiency 08/15/2023   Antiplatelet or antithrombotic long-term use 08/15/2023   Abdominal hernia 03/19/2023   Diabetic neuropathy (HCC) 08/29/2022   Vaginal lesion 08/29/2022   PTSD (post-traumatic stress disorder) 06/24/2022   GAD (generalized anxiety disorder) 06/24/2022   Major depressive disorder, recurrent episode, moderate (HCC) 06/24/2022   Urinary incontinence 06/20/2022   Intertrigo 06/20/2022   Arthritis of left shoulder region    S/P reverse total shoulder arthroplasty, 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   Healthcare maintenance 11/17/2021   Spondylolisthesis, lumbar region 02/11/2021    Class: Acute   Malignant neoplasm of upper-outer quadrant of right breast in female, estrogen receptor positive (HCC) 06/10/2018   Restless leg syndrome 02/04/2018   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   Depression 08/06/2015   Morbid obesity (HCC)    Hypertension associated with diabetes (HCC)    Aortic stenosis s/p TAVR    Chronic diastolic congestive heart failure (HCC)    COPD (chronic obstructive pulmonary disease) (HCC) 09/21/2013   Obstructive sleep apnea 09/21/2013   Nephrolithiasis 09/21/2013   Nocturnal hypoxemia 09/12/2013   Hyperlipidemia 11/30/2012   History of pulmonary embolus (PE) 07/09/2012   Normocytic anemia 02/08/2012   Type 2 diabetes mellitus without complication, without long-term current use of insulin (HCC) 02/08/2012   Asthma 02/08/2012   GERD (gastroesophageal reflux disease)    CAD (coronary artery disease) 12/08/2011   Past Medical History:  Diagnosis Date   Anemia    Anxiety    Aortic valve stenosis, severe    Arthritis    PAIN AND SEVERE OA LEFT KNEE ; S/P RIGHT TKA ON 02/03/12; HAS LOWER BACK PAIN-UNABLE TO STAND MORE THAN 10 MIN; ARTHRITIS "ALL OVER"   Asthma    Blood transfusion    2013[facility]   Breast cancer in female [facility])    Right   CAD (coronary artery disease)    Cath 2010 with DES x 1 RCA-- PT'S CARDIOLOGIST IS  DR. MCALHANY   Chronic diastolic congestive heart failure (HCC)    COPD (chronic obstructive pulmonary disease) (HCC)    Mild   COPD exacerbation (HCC) 11/16/2022   Depression    Diabetes mellitus DIAGNOSED IN2010   Dyspnea    with much ambulation   Eczema    on back   Headache    migraines younger- rare now 02/07/21   Heart murmur    no current problems   History of hiatal hernia    History of kidney stones    passed or blasted   History of UTI 12/05/2022   Hyperlipidemia    Hypertension    Morbid obesity with body mass index of 60.0-69.9 in adult [facility])    Myocardial infarction (HCC)    PT THINKS SHE WAS DX WITH MI AT THE TIME OF HEART STENTING   Neuromuscular disorder (HCC)    bilateral arm/hands   Oxygen dependent  uses 3L oxygen night/prn   Personal history of radiation therapy    Pneumonia    Pulmonary embolism (HCC) 02/08/2012   S/P RT TOTAL KNEE ON 02/03/12--ON 02/08/12--DEVELOPED ACUTE SOB AND CHEST PAIN--AND DIAGNOSED WITH  PULMONARY EMBOLUS AND PNEUMONIA   Restless leg syndrome    Sleep apnea    Uterine fibroid    NO PROBLEMS AT PRESENT FROM THE FIBROIDS-STATES SHE IS POST MENOPAUSAL-LAST MENSES 2010 EXCEPT FOR EPISODE THIS YR OF BLEEDING RELATED TO FIBROIDS.   Weakness    BOTH HANDS - S/P BILATERAL CARPAL TUNNEL RELEASE--BUT STILL HAS WEAKNESS--OFTEN DROPS THINGS    Family History  Problem Relation Age of Onset   Breast cancer Mother 20 - 21       stage IV at diagnosis   Emphysema Mother        smoked   Heart disease Mother    COPD Father        smoked   Asthma Father    Heart disease Father    Cancer Brother        Sinus   Colon cancer Neg Hx    Esophageal cancer Neg Hx    Inflammatory bowel disease Neg Hx    Liver disease Neg Hx    Pancreatic cancer Neg Hx    Rectal cancer Neg Hx    Stomach cancer Neg Hx     Past Surgical History:  Procedure Laterality Date   BACK SURGERY  02/11/2021   Dr Otelia Sergeant   BICEPT TENODESIS Right 09/15/2023   Procedure:  BICEPS TENODESIS;  Surgeon: Cammy Copa, MD;  Location: [facility] OR;  Service: Orthopedics;  Laterality: Right;   BREAST BIOPSY Right 06/04/2018   BREAST LUMPECTOMY Right 06/2018   BREAST LUMPECTOMY WITH RADIOACTIVE SEED AND SENTINEL LYMPH NODE BIOPSY Right 07/19/2018   Procedure: BREAST LUMPECTOMY WITH RADIOACTIVE SEED AND SENTINEL LYMPH NODE BIOPSY;  Surgeon: Ovidio Kin, MD;  Location: [facility] OR;  Service: General;  Laterality: Right;   CARPAL TUNNEL RELEASE     Bilateral   CHOLECYSTECTOMY     COLONOSCOPY  2010   CORONARY ANGIOPLASTY     2010 has stent in place   CYSTOSCOPY W/ RETROGRADES Right 09/21/2013   Procedure: CYSTOSCOPY WITH RIGHT RETROGRADE PYELOGRAM RIGHT DOUBLE J STENT ;  Surgeon: Antony Haste, MD;  Location: [facility] ORS;  Service: Urology;  Laterality: Right;   CYSTOSCOPY W/ URETERAL STENT PLACEMENT Right 11/16/2022   Procedure: CYSTOSCOPY WITH RETROGRADE PYELOGRAM/URETERAL STENT PLACEMENT;  Surgeon: Despina Arias, MD;  Location: [facility] OR;  Service: Urology;  Laterality: Right;   CYSTOSCOPY WITH RETROGRADE PYELOGRAM, URETEROSCOPY AND STENT PLACEMENT Right 01/13/2023   Procedure: CYSTOSCOPY WITH RETROGRADE PYELOGRAM, URETEROSCOPY AND STENT EXCHANGE;  Surgeon: Milderd Meager., MD;  Location: [facility] ORS;  Service: Urology;  Laterality: Right;   CYSTOSCOPY WITH URETEROSCOPY AND STENT PLACEMENT Right 10/25/2013   Procedure: CYSTOSCOPY RIGHT URETEROSCOPY HOLMIUM LASER LITHO AND STENT PLACEMENT;  Surgeon: Antony Haste, MD;  Location: [facility] ORS;  Service: Urology;  Laterality: Right;   HERNIA REPAIR  1995   umbilical ,   HOLMIUM LASER APPLICATION Right 01/13/2023   Procedure: HOLMIUM LASER APPLICATION;  Surgeon: Milderd Meager., MD;  Location: [facility] ORS;  Service: Urology;  Laterality: Right;   INTRAOPERATIVE TRANSESOPHAGEAL ECHOCARDIOGRAM N/A 12/12/2014   Procedure: INTRAOPERATIVE TRANSESOPHAGEAL ECHOCARDIOGRAM;  Surgeon: Kathleene Hazel, MD;  Location: [facility]  OR;  Service: Cardiovascular;  Laterality: N/A;   KNEE ARTHROPLASTY  02/03/2012   Procedure: COMPUTER ASSISTED TOTAL KNEE ARTHROPLASTY;  Surgeon: [name], MD;  Location: [facility] OR;  Service: Orthopedics;  Laterality: Right;  Right total knee arthroplasty   LEFT AND RIGHT HEART CATHETERIZATION WITH CORONARY ANGIOGRAM N/A 03/17/2013   Procedure: LEFT AND RIGHT HEART CATHETERIZATION WITH CORONARY ANGIOGRAM;  Surgeon: Kathleene Hazel, MD;  Location: [facility] CATH LAB;  Service: Cardiovascular;  Laterality: N/A;   LEFT AND RIGHT HEART CATHETERIZATION WITH CORONARY/GRAFT ANGIOGRAM N/A 09/14/2014   Procedure: LEFT AND RIGHT HEART CATHETERIZATION WITH Isabel Caprice;  Surgeon: Kathleene Hazel, MD;  Location: [facility] CATH LAB;  Service: Cardiovascular;  Laterality: N/A;   REVERSE SHOULDER ARTHROPLASTY Left 03/06/2022   Procedure: LEFT SHOULDER REPLACEMENT APPLICATION OF WOUND VAC;  Surgeon: Cammy Copa, MD;  Location: [facility] OR;  Service: Orthopedics;  Laterality: Left;   REVERSE SHOULDER ARTHROPLASTY Right 09/15/2023   Procedure: REVERSE SHOULDER ARTHROPLASTY;  Surgeon: Cammy Copa, MD;  Location: [facility] OR;  Service: Orthopedics;  Laterality: Right;   TEE WITHOUT CARDIOVERSION N/A 03/14/2013   Procedure: TRANSESOPHAGEAL ECHOCARDIOGRAM (TEE);  Surgeon: Lewayne Bunting, MD;  Location: [facility] ENDOSCOPY;  Service: Cardiovascular;  Laterality: N/A;   TEE WITHOUT CARDIOVERSION N/A 11/14/2014   Procedure: TRANSESOPHAGEAL ECHOCARDIOGRAM (TEE);  Surgeon: Lewayne Bunting, MD;  Location: [facility] ENDOSCOPY;  Service: Cardiovascular;  Laterality: N/A;   TONSILLECTOMY     maybe as a child- does not know   TOTAL KNEE ARTHROPLASTY  09/10/2012   Procedure: TOTAL KNEE ARTHROPLASTY;  Surgeon: [name], MD;  Location: [facility] ORS;  Service: Orthopedics;  Laterality: Left;   TOTAL KNEE REVISION Right 07/15/2013   Procedure: REVISION ARTHROPLASTY RIGHT KNEE;  Surgeon: [name],  MD;  Location: [facility] ORS;  Service: Orthopedics;  Laterality: Right;   TRANSCATHETER AORTIC VALVE REPLACEMENT, TRANSFEMORAL N/A 12/12/2014   Procedure: TRANSCATHETER AORTIC VALVE REPLACEMENT, TRANSFEMORAL;  Surgeon: Kathleene Hazel, MD;  Location: [facility] OR;  Service: Cardiovascular;  Laterality: N/A;   TRIGGER FINGER RELEASE  09/10/2012   Procedure: RELEASE TRIGGER FINGER/A-1 PULLEY;  Surgeon: [name], MD;  Location: [facility] ORS;  Service: Orthopedics;  Laterality: Right;  Right Ring Finger   TUBAL LIGATION     Social History   Occupational History   Occupation: Disabled  Tobacco Use   Smoking status: Former    Current packs/day: 0.00    Average packs/day: 1.5 packs/day for 30.0 years (45.0 ttl pk-yrs)    Types: Cigarettes    Start date: 12/29/1970    Quit date: 12/29/2000    Years since quitting: 23.0   Smokeless tobacco: Never  Vaping Use   Vaping status: Never Used  Substance and Sexual Activity   Alcohol use: Not Currently   Drug use: Not Currently    Types: Marijuana    Comment: CBD oils through vape shops   Sexual activity: Not Currently    Birth control/protection: Surgical, Post-menopausal    Comment: tubal ligation

## 2024-01-21 ENCOUNTER — Telehealth: Payer: Self-pay | Admitting: Student

## 2024-01-21 DIAGNOSIS — E119 Type 2 diabetes mellitus without complications: Secondary | ICD-10-CM

## 2024-01-21 MED ORDER — METFORMIN HCL ER 500 MG PO TB24: 500.0000 mg | ORAL_TABLET | Freq: Two times a day (BID) | ORAL | 0 refills | Status: DC

## 2024-01-21 NOTE — Telephone Encounter (Signed)
Pt requesting a call back to discuss restarting the following medication:    metFORMIN (GLUCOPHAGE-XR) 500 MG 24 hr tablet

## 2024-01-21 NOTE — Telephone Encounter (Signed)
Pt was called and informed she can start Metformin; stated she will need a refill.

## 2024-01-21 NOTE — Telephone Encounter (Signed)
Return pt's call who stated at the last OV (12/01/23) she was started on Jardiance  and Metformin was stopped. Pt stated since being on Jardiance, she had had UTI/yeast infection and burning when she goes to the bathroom. Stated it's too strong so she stopped taking Jardiance this past w/e and the symptoms have stopped. She wants to know if she can be restarted back on Metformin or something else?

## 2024-01-27 ENCOUNTER — Ambulatory Visit: Payer: Medicaid Other | Admitting: Orthopedic Surgery

## 2024-01-29 ENCOUNTER — Encounter: Payer: Self-pay | Admitting: Physician Assistant

## 2024-01-29 ENCOUNTER — Ambulatory Visit: Payer: Medicaid Other | Attending: Physician Assistant | Admitting: Physician Assistant

## 2024-01-29 ENCOUNTER — Other Ambulatory Visit: Payer: Self-pay | Admitting: *Deleted

## 2024-01-29 VITALS — BP 114/72 | HR 98 | Ht 61.0 in | Wt 276.4 lb

## 2024-01-29 DIAGNOSIS — I2510 Atherosclerotic heart disease of native coronary artery without angina pectoris: Secondary | ICD-10-CM

## 2024-01-29 DIAGNOSIS — I350 Nonrheumatic aortic (valve) stenosis: Secondary | ICD-10-CM | POA: Diagnosis not present

## 2024-01-29 DIAGNOSIS — R0609 Other forms of dyspnea: Secondary | ICD-10-CM

## 2024-01-29 DIAGNOSIS — Z952 Presence of prosthetic heart valve: Secondary | ICD-10-CM

## 2024-01-29 DIAGNOSIS — E785 Hyperlipidemia, unspecified: Secondary | ICD-10-CM | POA: Diagnosis not present

## 2024-01-29 MED ORDER — LOSARTAN POTASSIUM-HCTZ 50-12.5 MG PO TABS
1.0000 | ORAL_TABLET | Freq: Every day | ORAL | Status: DC
Start: 2024-01-29 — End: 2024-02-10

## 2024-01-29 NOTE — Progress Notes (Signed)
Cardiology Office Note:  .   Date:  01/29/2024  ID:  [name], DOB 1961-12-26, MRN [id number] PCP: Philomena Doheny, MD  [facility] Providers Cardiologist:  Verne Carrow, MD {  History of Present Illness: .   [name] is a 63 y.o. female with a past medical history of severe aortic stenosis status post TAVR in 2015, CAD status post DES to distal RCA 01/2009, repeat cath 2015 with 70% ostial RCA treated medically, no obstructive disease in the LAD or circumflex, OSA, HTN, hyperlipidemia, obesity, GERD, asthma, former tobacco abuse, COPD, and depression here for follow-up appointment.  Was seen last 12/25/2022.  History includes TAVR with a 26 mm SAPIEN prosthetic valve on 12/12/2014.  Echocardiogram 1/22 revealing normal LVEF 66 5%, mild LVH, indeterminate diastolic parameters, normal RV size and function, mild mitral stenosis, aortic valve was functioning properly with a mean gradient of 15 mmHg.  It when she was last seen she presented for cardiac clearance for upcoming cystoscopy with right retrograde pyelogram and right uteroscopy with laser right ureteral stent exchange and request was hold Plavix x 5 days.  She reported DOE that she attributes to her weight although persists over the previous 2 years despite 60 pound weight loss.  No chest pain or palpitations.  Denies syncope, presyncope, PND.  Does have 2 pillow orthopnea.  Uses a motorized scooter for walking long distances.  She was working on reducing portion size and increasing physical activity.  Today, she presents with a history of heart disease, including a heart attack and aortic valve replacement with occasional dizziness and heart palpitations. The dizziness is more pronounced when transitioning from a sitting to standing position. The palpitations are described as a noticeable sensation in the chest, though not painful.  In addition to these symptoms, the patient has made significant  lifestyle changes, resulting in an 80-pound weight loss. This was achieved through a combination of dietary changes, including portion control and reduced sugar intake, and medication. Despite this weight loss, the patient still experiences shortness of breath when walking, which she attributes to excess skin from the weight loss.  The patient is currently on multiple medications, including Hyzaar, amlodipine, and metoprolol. The patient has expressed a desire to reduce medication intake as her health improves.  Reports no chest pain, pressure, or tightness. No edema, orthopnea, PND. Reports no palpitations.   Discussed the use of AI scribe software for clinical note transcription with the patient, who gave verbal consent to proceed.  ROS: Pertinent ROS in HPI  Studies Reviewed: .        Echo 01/23/21 1. Left ventricular ejection fraction, by estimation, is 60 to 65%. The  left ventricle has normal function. The left ventricle has no regional  wall motion abnormalities. There is mild concentric left ventricular  hypertrophy. Left ventricular diastolic  parameters are indeterminate.   2. Right ventricular systolic function is normal. The right ventricular  size is normal.   3. The mitral valve is abnormal. No evidence of mitral valve  regurgitation. Mild mitral stenosis. The mean mitral valve gradient is 4.0  mmHg with average heart rate of 77 bpm.   4. The aortic valve has been repaired/replaced. Aortic valve  regurgitation is not visualized. 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 Procedure Date: 12/12/2014. Aortic  valve mean gradient measures 15.0 mmHg.   5. The inferior vena cava is normal in size with greater than 50%  respiratory variability, suggesting right atrial pressure of 3 mmHg.   Cardiac cath 09/14/14: Left main: No obstructive disease.  Left Anterior Descending Artery: Large caliber vessel that courses to the apex. There is a  moderate caliber diagonal branch. No obstructive disease noted.  Circumflex Artery: Large caliber vessel with two small obtuse marginal branches and a moderate caliber posterolateral branch. No obstructive disease noted.  Right Coronary Artery: Large dominant vessel with 70-80% ostial stenosis. 40% proximal stenosis. Distal stented segment is patent with no restenosis.  Left Ventricular Angiogram: Deferred.    Physical Exam:   VS:  BP 114/72   Pulse 98   Ht 5\' 1"  (1.549 m)   Wt 276 lb 6.4 oz (125.4 kg)   LMP 02/03/2012   SpO2 92%   BMI 52.23 kg/m    Wt Readings from Last 3 Encounters:  01/29/24 276 lb 6.4 oz (125.4 kg)  12/01/23 287 lb 4.8 oz (130.3 kg)  09/15/23 298 lb 14.4 oz (135.6 kg)    GEN: Well nourished, well developed in no acute distress NECK: No JVD; No carotid bruits CARDIAC: RRR, + systolic murmurs, rubs, gallops RESPIRATORY:  Clear to auscultation without rales, wheezing or rhonchi  ABDOMEN: Soft, non-tender, non-distended EXTREMITIES:  No edema; No deformity   ASSESSMENT AND PLAN: .   Weight Management Significant weight loss of 80 pounds through portion control, increased physical activity, and medication (Ozempic). -Continue current weight loss strategies.  Dizziness/palpitations Patient reports occasional dizziness and palpitations. No chest pain. Sleeps with two pillows. History of aortic valve replacement. Mild murmur noted on examination. -Order echocardiogram to assess aortic valve and rule out perivalvular leak. -Consider medication adjustment based on echocardiogram results.  Hypertension On Hyzaar 100/25mg  and amlodipine 5mg . Given weight loss, there is a possibility of over-medication leading to dizziness. -Reduce Hyzaar to 50/12.5mg  daily. -Continue amlodipine 5mg  daily. -Advise patient to monitor blood pressure at home.  Hyperlipidemia On atorvastatin. -Order fasting lipid panel and liver function tests. -Refill atorvastatin  prescription.  Follow-up -Return in 3 months for follow-up. -Contact cardiologist regarding continuation of Plavix given history of heart attack and current cardiac status.  Severe arctic stenosis status post TAVR -update echo   Dispo: She can follow-up in 3 months with Dr. Clifton James  Signed, Sharlene Dory, PA-C

## 2024-01-29 NOTE — Patient Instructions (Signed)
Medication Instructions:  1.Decrease hyzaar to 50/12.5 mg daily  *If you need a refill on your cardiac medications before your next appointment, please call your pharmacy*   Lab Work: Lipids, Lft's, BMP when you are fasting You will need to go to any [facility] for these labs. There is a [facility] in our building on the 1st floor or you can call [phone] or visit [email or web address] to find a lab near you. - You do NOT need an appointment for this. You do NOT need to be fasting. We NO LONGER have a lab located in our office.   If you have labs (blood work) drawn today and your tests are completely normal, you will receive your results only by: MyChart Message (if you have MyChart) OR A paper copy in the mail If you have any lab test that is abnormal or we need to change your treatment, we will call you to review the results.   Testing/Procedures: Your physician has requested that you have an echocardiogram. Echocardiography is a painless test that uses sound waves to create images of your heart. It provides your doctor with information about the size and shape of your heart and how well your heart's chambers and valves are working. This procedure takes approximately one hour. There are no restrictions for this procedure. Please do NOT wear cologne, perfume, aftershave, or lotions (deodorant is allowed). Please arrive 15 minutes prior to your appointment time.  Please note: We ask at that you not bring children with you during ultrasound (echo/ vascular) testing. Due to room size and safety concerns, children are not allowed in the ultrasound rooms during exams. Our front office staff cannot provide observation of children in our lobby area while testing is being conducted. An adult accompanying a patient to their appointment will only be allowed in the ultrasound room at the discretion of the ultrasound technician under special circumstances. We apologize for any inconvenience.    Follow-Up: At [facility], you and your health needs are our priority.  As part of our continuing mission to provide you with exceptional heart care, we have created designated Provider Care Teams.  These Care Teams include your primary Cardiologist (physician) and Advanced Practice Providers (APPs -  Physician Assistants and Nurse Practitioners) who all work together to provide you with the care you need, when you need it.  We recommend signing up for the patient portal called "MyChart".  Sign up information is provided on this After Visit Summary.  MyChart is used to connect with patients for Virtual Visits (Telemedicine).  Patients are able to view lab/test results, encounter notes, upcoming appointments, etc.  Non-urgent messages can be sent to your provider as well.   To learn more about what you can do with MyChart, go to [email or web address].    Your next appointment:   3 month(s)  Provider:   Verne Carrow, MD  or APP

## 2024-01-30 DIAGNOSIS — Z419 Encounter for procedure for purposes other than remedying health state, unspecified: Secondary | ICD-10-CM | POA: Diagnosis not present

## 2024-02-02 ENCOUNTER — Other Ambulatory Visit: Payer: Self-pay | Admitting: Nurse Practitioner

## 2024-02-08 ENCOUNTER — Ambulatory Visit: Payer: Medicaid Other | Admitting: Orthopaedic Surgery

## 2024-02-10 ENCOUNTER — Telehealth: Payer: Self-pay | Admitting: Cardiovascular Disease

## 2024-02-10 DIAGNOSIS — I2510 Atherosclerotic heart disease of native coronary artery without angina pectoris: Secondary | ICD-10-CM

## 2024-02-10 DIAGNOSIS — E785 Hyperlipidemia, unspecified: Secondary | ICD-10-CM

## 2024-02-10 DIAGNOSIS — I350 Nonrheumatic aortic (valve) stenosis: Secondary | ICD-10-CM

## 2024-02-10 MED ORDER — ATORVASTATIN CALCIUM 80 MG PO TABS: 80.0000 mg | ORAL_TABLET | Freq: Every day | ORAL | 3 refills | Status: AC

## 2024-02-10 MED ORDER — CLOPIDOGREL BISULFATE 75 MG PO TABS: 75.0000 mg | ORAL_TABLET | Freq: Every morning | ORAL | 3 refills | Status: DC

## 2024-02-10 MED ORDER — LOSARTAN POTASSIUM-HCTZ 50-12.5 MG PO TABS: 1.0000 | ORAL_TABLET | Freq: Every day | ORAL | 3 refills | Status: AC

## 2024-02-10 NOTE — Telephone Encounter (Signed)
*  STAT* If patient is at the pharmacy, call can be transferred to refill team.   1. Which medications need to be refilled? (please list name of each medication and dose if known) clopidogrel (PLAVIX) 75 MG tablet  atorvastatin (LIPITOR) 80 MG tablet  losartan-hydrochlorothiazide (HYZAAR) 50-12.5 MG tablet   2. Which pharmacy/location (including street and city if local pharmacy) is medication to be sent to? [facility] - [place], [place] - [address]   3. Do they need a 30 day or 90 day supply? 90

## 2024-02-13 ENCOUNTER — Other Ambulatory Visit: Payer: Self-pay | Admitting: Student

## 2024-02-17 ENCOUNTER — Encounter (HOSPITAL_COMMUNITY): Payer: Self-pay | Admitting: Psychiatry

## 2024-02-17 ENCOUNTER — Telehealth (HOSPITAL_COMMUNITY): Payer: Medicaid Other | Admitting: Psychiatry

## 2024-02-17 DIAGNOSIS — F411 Generalized anxiety disorder: Secondary | ICD-10-CM

## 2024-02-17 DIAGNOSIS — F4310 Post-traumatic stress disorder, unspecified: Secondary | ICD-10-CM

## 2024-02-17 DIAGNOSIS — F32A Depression, unspecified: Secondary | ICD-10-CM

## 2024-02-17 MED ORDER — BUSPIRONE HCL 15 MG PO TABS: 15.0000 mg | ORAL_TABLET | Freq: Two times a day (BID) | ORAL | 3 refills | Status: DC

## 2024-02-17 MED ORDER — TRAZODONE HCL 150 MG PO TABS: 150.0000 mg | ORAL_TABLET | Freq: Every day | ORAL | 3 refills | Status: DC

## 2024-02-17 MED ORDER — DULOXETINE HCL 20 MG PO CPEP: 20.0000 mg | ORAL_CAPSULE | Freq: Every day | ORAL | 3 refills | Status: DC

## 2024-02-17 NOTE — Progress Notes (Signed)
BH MD/PA/NP OP Progress Note Virtual Visit via Video Note  I connected with [name] on 02/17/24 at  1:30 PM EST by a video enabled telemedicine application and verified that I am speaking with the correct person using two identifiers.  Location: Patient: Home Provider: Clinic   I discussed the limitations of evaluation and management by telemedicine and the availability of in person appointments. The patient expressed understanding and agreed to proceed.  I provided 30 minutes of non-face-to-face time during this encounter.   02/17/2024 12:42 PM [name]  MRN:  [id number]  Chief Complaint: "I continue to lose weight"  HPI:  63 year old female seen today for follow-up psychiatric evaluation.  She has a psychiatric history of anxiety, depression, and PTSD.  Currently she is being managed on BuSpar 15 mg 3 times daily (noting that she takes it twice daily), Cymbalta 20 mg daily, and trazodone 50 to 100 mg nightly as needed.  She notes the medications are somehat effective in managing her psychiatric condition.   Today she was well-groomed, pleasant, cooperative, and engaged in conversation.  She informed Clinical research associate that she continues to lose weight. Since her last visit she notes that she has lost 10 pounds with the help of Wegovy.  She notes that she has lost over 80 lbs past year. Patient notes that her anxiety and depression continues to be well managed. Today provider conducted a GAD 7 and patient scored a 6, at her last visit she scored a 4. Provider also conducted a PHQ 9 and patient scored a 6 at her last visit she scored a 1. She endorses adequate appetite.    Since her last visit she notes that her sleep has been poor. She reports that she sleeps 5 hours nightly. She notes that she worries about her husband as he has COPD and at times has issues breathing. She notes that she wakes up and checks on him at times. Provider recommended increasing Trazodone to 150 mg. She  was agreeable.  Today trazodone 100 mg increased to 150 mg to help manage sleep.  She will continue her other medications as prescribed.  No other concerns noted at this time.     Visit Diagnosis:    ICD-10-CM   1. GAD (generalized anxiety disorder)  F41.1 busPIRone (BUSPAR) 15 MG tablet    DULoxetine (CYMBALTA) 20 MG capsule    traZODone (DESYREL) 150 MG tablet    2. PTSD (post-traumatic stress disorder)  F43.10 busPIRone (BUSPAR) 15 MG tablet    traZODone (DESYREL) 150 MG tablet    3. Mild depression  F32.A busPIRone (BUSPAR) 15 MG tablet    DULoxetine (CYMBALTA) 20 MG capsule    traZODone (DESYREL) 150 MG tablet         Past Psychiatric History: anxiety, depression, and PTSD  Past Medical History:  Past Medical History:  Diagnosis Date   Anemia    Anxiety    Aortic valve stenosis, severe    Arthritis    PAIN AND SEVERE OA LEFT KNEE ; S/P RIGHT TKA ON 02/03/12; HAS LOWER BACK PAIN-UNABLE TO STAND MORE THAN 10 MIN; ARTHRITIS "ALL OVER"   Asthma    Blood transfusion    [facility]   Breast cancer in female [facility])    Right   CAD (coronary artery disease)    Cath 2010 with DES x 1 RCA-- PT'S CARDIOLOGIST IS DR. MCALHANY   Chronic diastolic congestive heart failure (HCC)    COPD (chronic obstructive pulmonary disease) (HCC)  Mild   COPD exacerbation (HCC) 11/16/2022   Depression    Diabetes mellitus DIAGNOSED IN2010   Dyspnea    with much ambulation   Eczema    on back   Headache    migraines younger- rare now 02/07/21   Heart murmur    no current problems   History of hiatal hernia    History of kidney stones    passed or blasted   History of UTI 12/05/2022   Hyperlipidemia    Hypertension    Morbid obesity with body mass index of 60.0-69.9 in adult [facility])    Myocardial infarction (HCC)    PT THINKS SHE WAS DX WITH MI AT THE TIME OF HEART STENTING   Neuromuscular disorder (HCC)    bilateral arm/hands   Oxygen dependent    uses 3L oxygen night/prn   Personal  history of radiation therapy    Pneumonia    Pulmonary embolism (HCC) 02/08/2012   S/P RT TOTAL KNEE ON 02/03/12--ON 02/08/12--DEVELOPED ACUTE SOB AND CHEST PAIN--AND DIAGNOSED WITH  PULMONARY EMBOLUS AND PNEUMONIA   Restless leg syndrome    Sleep apnea    Uterine fibroid    NO PROBLEMS AT PRESENT FROM THE FIBROIDS-STATES SHE IS POST MENOPAUSAL-LAST MENSES 2010 EXCEPT FOR EPISODE THIS YR OF BLEEDING RELATED TO FIBROIDS.   Weakness    BOTH HANDS - S/P BILATERAL CARPAL TUNNEL RELEASE--BUT STILL HAS WEAKNESS--OFTEN DROPS THINGS    Past Surgical History:  Procedure Laterality Date   BACK SURGERY  02/11/2021   Dr Otelia Sergeant   BICEPT TENODESIS Right 09/15/2023   Procedure: BICEPS TENODESIS;  Surgeon: Cammy Copa, MD;  Location[facility];  Service: Orthopedics;  Laterality: Right;   BREAST BIOPSY Right 06/04/2018   BREAST LUMPECTOMY Right 06/2018   BREAST LUMPECTOMY WITH RADIOACTIVE SEED AND SENTINEL LYMPH NODE BIOPSY Right 07/19/2018   Procedure: BREAST LUMPECTOMY WITH RADIOACTIVE SEED AND SENTINEL LYMPH NODE BIOPSY;  Surgeon: Ovidio Kin, MD;  Location: [facility] OR;  Service: General;  Laterality: Right;   CARPAL TUNNEL RELEASE     Bilateral   CHOLECYSTECTOMY     COLONOSCOPY  2010   CORONARY ANGIOPLASTY     2010 has stent in place   CYSTOSCOPY W/ RETROGRADES Right 09/21/2013   Procedure: CYSTOSCOPY WITH RIGHT RETROGRADE PYELOGRAM RIGHT DOUBLE J STENT ;  Surgeon: Antony Haste, MD;  Location: [facility] ORS;  Service: Urology;  Laterality: Right;   CYSTOSCOPY W/ URETERAL STENT PLACEMENT Right 11/16/2022   Procedure: CYSTOSCOPY WITH RETROGRADE PYELOGRAM/URETERAL STENT PLACEMENT;  Surgeon: Despina Arias, MD;  Location: [facility] OR;  Service: Urology;  Laterality: Right;   CYSTOSCOPY WITH RETROGRADE PYELOGRAM, URETEROSCOPY AND STENT PLACEMENT Right 01/13/2023   Procedure: CYSTOSCOPY WITH RETROGRADE PYELOGRAM, URETEROSCOPY AND STENT EXCHANGE;  Surgeon: Milderd Meager., MD;  Location: [facility] ORS;   Service: Urology;  Laterality: Right;   CYSTOSCOPY WITH URETEROSCOPY AND STENT PLACEMENT Right 10/25/2013   Procedure: CYSTOSCOPY RIGHT URETEROSCOPY HOLMIUM LASER LITHO AND STENT PLACEMENT;  Surgeon: Antony Haste, MD;  Location: [facility]S;  Service: Urology;  Laterality: Right;   HERNIA REPAIR  1995   umbilical ,   HOLMIUM LASER APPLICATION Right 01/13/2023   Procedure: HOLMIUM LASER APPLICATION;  Surgeon: Milderd Meager., MD;  Location: [facility] ORS;  Service: Urology;  Laterality: Right;   INTRAOPERATIVE TRANSESOPHAGEAL ECHOCARDIOGRAM N/A 12/12/2014   Procedure: INTRAOPERATIVE TRANSESOPHAGEAL ECHOCARDIOGRAM;  Surgeon: Kathleene Hazel, MD;  Location: [facility] OR;  Service: Cardiovascular;  Laterality: N/A;   KNEE ARTHROPLASTY  02/03/2012  Procedure: COMPUTER ASSISTED TOTAL KNEE ARTHROPLASTY;  Surgeon: [name] Hitch, MD;  Location: [facility] OR;  Service: Orthopedics;  Laterality: Right;  Right total knee arthroplasty   LEFT AND RIGHT HEART CATHETERIZATION WITH CORONARY ANGIOGRAM N/A 03/17/2013   Procedure: LEFT AND RIGHT HEART CATHETERIZATION WITH CORONARY ANGIOGRAM;  Surgeon: Kathleene Hazel, MD;  Location: [facility] CATH LAB;  Service: Cardiovascular;  Laterality: N/A;   LEFT AND RIGHT HEART CATHETERIZATION WITH CORONARY/GRAFT ANGIOGRAM N/A 09/14/2014   Procedure: LEFT AND RIGHT HEART CATHETERIZATION WITH Isabel Caprice;  Surgeon: Kathleene Hazel, MD;  Location: [facility] CATH LAB;  Service: Cardiovascular;  Laterality: N/A;   REVERSE SHOULDER ARTHROPLASTY Left 03/06/2022   Procedure: LEFT SHOULDER REPLACEMENT APPLICATION OF WOUND VAC;  Surgeon: Cammy Copa, MD;  Location: [facility] OR;  Service: Orthopedics;  Laterality: Left;   REVERSE SHOULDER ARTHROPLASTY Right 09/15/2023   Procedure: REVERSE SHOULDER ARTHROPLASTY;  Surgeon: Cammy Copa, MD;  Location: [facility] OR;  Service: Orthopedics;  Laterality: Right;   TEE WITHOUT CARDIOVERSION N/A 03/14/2013   Procedure:  TRANSESOPHAGEAL ECHOCARDIOGRAM (TEE);  Surgeon: Lewayne Bunting, MD;  Location: [facility] ENDOSCOPY;  Service: Cardiovascular;  Laterality: N/A;   TEE WITHOUT CARDIOVERSION N/A 11/14/2014   Procedure: TRANSESOPHAGEAL ECHOCARDIOGRAM (TEE);  Surgeon: Lewayne Bunting, MD;  Location: [facility] ENDOSCOPY;  Service: Cardiovascular;  Laterality: N/A;   TONSILLECTOMY     maybe as a child- does not know   TOTAL KNEE ARTHROPLASTY  09/10/2012   Procedure: TOTAL KNEE ARTHROPLASTY;  Surgeon: [name] Hitch, MD;  Location: [facility]S;  Service: Orthopedics;  Laterality: Left;   TOTAL KNEE REVISION Right 07/15/2013   Procedure: REVISION ARTHROPLASTY RIGHT KNEE;  Surgeon: [name] Hitch, MD;  Location: [facility]S;  Service: Orthopedics;  Laterality: Right;   TRANSCATHETER AORTIC VALVE REPLACEMENT, TRANSFEMORAL N/A 12/12/2014   Procedure: TRANSCATHETER AORTIC VALVE REPLACEMENT, TRANSFEMORAL;  Surgeon: Kathleene Hazel, MD;  Location: [facility] OR;  Service: Cardiovascular;  Laterality: N/A;   TRIGGER FINGER RELEASE  09/10/2012   Procedure: RELEASE TRIGGER FINGER/A-1 PULLEY;  Surgeon: [name] Hitch, MD;  Location: [facility]S;  Service: Orthopedics;  Laterality: Right;  Right Ring Finger   TUBAL LIGATION      Family Psychiatric History: Mother alcohol use and brother intellectual delay   Family History:  Family History  Problem Relation Age of Onset   Breast cancer Mother 82 - 51       stage IV at diagnosis   Emphysema Mother        smoked   Heart disease Mother    COPD Father        smoked   Asthma Father    Heart disease Father    Cancer Brother        Sinus   Colon cancer Neg Hx    Esophageal cancer Neg Hx    Inflammatory bowel disease Neg Hx    Liver disease Neg Hx    Pancreatic cancer Neg Hx    Rectal cancer Neg Hx    Stomach cancer Neg Hx     Social History:  Social History   Socioeconomic History   Marital status: Married    Spouse name: Not on file   Number of children: 2   Years  of education: Not on file   Highest education level: Not on file  Occupational History   Occupation: Disabled  Tobacco Use   Smoking status: Former    Current packs/day: 0.00    Average packs/day: 1.5 packs/day for 30.0 years (  45.0 ttl pk-yrs)    Types: Cigarettes    Start date: 12/29/1970    Quit date: 12/29/2000    Years since quitting: 23.1   Smokeless tobacco: Never  Vaping Use   Vaping status: Never Used  Substance and Sexual Activity   Alcohol use: Not Currently   Drug use: Not Currently    Types: Marijuana    Comment: CBD oils through vape shops   Sexual activity: Not Currently    Birth control/protection: Surgical, Post-menopausal    Comment: tubal ligation  Other Topics Concern   Not on file  Social History Narrative   Not on file   Social Drivers of Health   Financial Resource Strain: Low Risk  (10/20/2023)   Overall Financial Resource Strain (CARDIA)    Difficulty of Paying Living Expenses: Not very hard  Food Insecurity: No Food Insecurity (12/04/2023)   Hunger Vital Sign    Worried About Running Out of Food in the Last Year: Never true    Ran Out of Food in the Last Year: Never true  Transportation Needs: No Transportation Needs (09/21/2023)   PRAPARE - Administrator, Civil Service (Medical): No    Lack of Transportation (Non-Medical): No  Physical Activity: Inactive (01/18/2024)   Exercise Vital Sign    Days of Exercise per Week: 0 days    Minutes of Exercise per Session: 0 min  Stress: No Stress Concern Present (09/07/2023)   [employer] of Occupational Health - Occupational Stress Questionnaire    Feeling of Stress : Not at all  Social Connections: Moderately Isolated (10/20/2023)   Social Connection and Isolation Panel [NHANES]    Frequency of Communication with Friends and Family: More than three times a week    Frequency of Social Gatherings with Friends and Family: More than three times a week    Attends Religious Services: Never     Database administrator or Organizations: No    Attends Engineer, structural: Never    Marital Status: Married    Allergies: No Known Allergies  Metabolic Disorder Labs: Lab Results  Component Value Date   HGBA1C 6.6 (A) 12/01/2023   MPG 157 09/09/2023   MPG 143 01/08/2023   No results found for: "PROLACTIN" Lab Results  Component Value Date   CHOL 165 12/25/2022   TRIG 78 12/25/2022   HDL 49 12/25/2022   CHOLHDL 3.4 12/25/2022   VLDL 20 12/28/2015   LDLCALC 101 (H) 12/25/2022   LDLCALC 87 06/04/2022   Lab Results  Component Value Date   TSH 2.820 09/25/2020   TSH 2.918 09/21/2013    Therapeutic Level Labs: No results found for: "LITHIUM" No results found for: "VALPROATE" No results found for: "CBMZ"  Current Medications: Current Outpatient Medications  Medication Sig Dispense Refill   acetaminophen (TYLENOL) 500 MG tablet Take 1,000 mg by mouth every 6 (six) hours as needed for moderate pain.     amLODipine (NORVASC) 5 MG tablet Take 1 tablet (5 mg total) by mouth daily. 90 tablet 2   aspirin EC 81 MG tablet Take 1 tablet (81 mg total) by mouth daily. Swallow whole. 30 tablet 12   atorvastatin (LIPITOR) 80 MG tablet Take 1 tablet (80 mg total) by mouth daily. 90 tablet 3   Blood Pressure Monitoring (BLOOD PRESSURE CUFF) MISC Please follow insert instructions. 1 each 0   busPIRone (BUSPAR) 15 MG tablet Take 1 tablet (15 mg total) by mouth 2 (two) times daily. 60 tablet 3  clopidogrel (PLAVIX) 75 MG tablet Take 1 tablet (75 mg total) by mouth every morning. 90 tablet 3   Continuous Glucose Receiver (DEXCOM G7 RECEIVER) DEVI 1 Units by Does not apply route daily. 1 each 0   DULoxetine (CYMBALTA) 20 MG capsule Take 1 capsule (20 mg total) by mouth daily. 30 capsule 3   Evolocumab (REPATHA SURECLICK) 140 MG/ML SOAJ Inject 140 mg into the skin every 14 (fourteen) days. 6 mL 3   ezetimibe (ZETIA) 10 MG tablet Take 1 tablet by mouth once daily 90 tablet 3    Fluticasone-Umeclidin-Vilant (TRELEGY ELLIPTA) 100-62.5-25 MCG/ACT AEPB INHALE 1 PUFF ONCE DAILY 180 each 0   gabapentin (NEURONTIN) 300 MG capsule TAKE 1 CAPSULE BY MOUTH IN THE MORNING AND 3 AT BEDTIME 120 capsule 3   hydrocortisone cream 1 % Apply 1 Application topically daily as needed for itching.     losartan-hydrochlorothiazide (HYZAAR) 50-12.5 MG tablet Take 1 tablet by mouth daily. 90 tablet 3   metFORMIN (GLUCOPHAGE-XR) 500 MG 24 hr tablet Take 1 tablet (500 mg total) by mouth 2 (two) times daily. 180 tablet 0   metoprolol succinate (TOPROL-XL) 50 MG 24 hr tablet Take 1 tablet (50 mg total) by mouth daily. Take with or immediately following a meal.TAKE 1 TABLET BY MOUTH ONCE DAILY WITH MEALS OR  IMMEDIATELY  FOLLOWING  A  MEAL 90 tablet 2   montelukast (SINGULAIR) 10 MG tablet Take 1 tablet (10 mg total) by mouth at bedtime. 90 tablet 1   senna-docusate (SENNA-TIME S) 8.6-50 MG tablet Take 1 tablet by mouth at bedtime as needed for mild constipation. 30 tablet 1   traZODone (DESYREL) 150 MG tablet Take 1 tablet (150 mg total) by mouth at bedtime. 30 tablet 3   VENTOLIN HFA 108 (90 Base) MCG/ACT inhaler Inhale 2 puffs into the lungs every 6 (six) hours as needed for wheezing or shortness of breath. 18 g 3   WEGOVY 2.4 MG/0.75ML SOAJ INJECT 2.4 MG INTO THE SKIN ONCE A WEEK 4 mL 0   No current facility-administered medications for this visit.     Musculoskeletal: Strength & Muscle Tone: within normal limits and Telehealth visit Gait & Station: normal, Telehealth Patient leans: N/A  Psychiatric Specialty Exam: Review of Systems  Last menstrual period 02/03/2012.There is no height or weight on file to calculate BMI.  General Appearance: Well Groomed  Eye Contact:  Good  Speech:  Clear and Coherent and Normal Rate  Volume:  Normal  Mood:  Euthymic  Affect:  Appropriate and Congruent  Thought Process:  Coherent, Goal Directed, and Linear  Orientation:  Full (Time, Place, and Person)   Thought Content: WDL and Logical   Suicidal Thoughts:  No  Homicidal Thoughts:  No  Memory:  Immediate;   Good Recent;   Good Remote;   Good  Judgement:  Good  Insight:  Good  Psychomotor Activity:  Normal  Concentration:  Concentration: Good and Attention Span: Good  Recall:  Good  Fund of Knowledge: Good  Language: Good  Akathisia:  No  Handed:  Right  AIMS (if indicated): not done  Assets:  Communication Skills Desire for Improvement Financial Resources/Insurance Housing Leisure Time Physical Health Social Support  ADL's:  Intact  Cognition: WNL  Sleep:  Fair   Screenings: GAD-7    Flowsheet Row Video Visit from 02/17/2024 in [facility] Video Visit from 12/15/2023 in [facility] Video Visit from 07/08/2023 in [facility]  Video Visit from 04/29/2023 in [facility] Counselor from 03/05/2023 in [facility]  Total GAD-7 Score 6 4 3 4 4       PHQ2-9    Flowsheet Row Video Visit from 02/17/2024 in [facility] Video Visit from 12/15/2023 in [facility] Office Visit from 08/18/2023 in [facility] [place] Video Visit from 07/08/2023 in [facility] Office Visit from 06/03/2023 in [facility] Of [place]. [facility]  PHQ-2 Total Score 0 0 0 0 0  PHQ-9 Total Score 6 1 -- 1 1      Flowsheet Row Admission (Discharged) from 09/15/2023 in [facility]  [facility] Pre-Admission Testing 60 from 09/09/2023 in [facility] PREADMISSION TESTING Admission (Discharged) from 01/13/2023 in [facility] PERIOPERATIVE ARE[facility]  C-SSRS RISK CATEGORY No Risk No Risk No Risk        Assessment and Plan: Patient reports that her sleep has been poor but notes  that her anxiety and depression are well-managed.Today trazodone 100 mg increased to 150 mg to help manage sleep.  She will continue her other medications as prescribed.  1. GAD (generalized anxiety disorder)  Continue- busPIRone (BUSPAR) 15 MG tablet; Take 1 tablet (15 mg total) by mouth 2 (two) times daily.  Dispense: 60 tablet; Refill: 3 Continue- DULoxetine (CYMBALTA) 20 MG capsule; Take 1 capsule (20 mg total) by mouth daily.  Dispense: 30 capsule; Refill: 3 Increased- traZODone (DESYREL) 150 MG tablet; Take 1 tablet (150 mg total) by mouth at bedtime.  Dispense: 30 tablet; Refill: 3  2. PTSD (post-traumatic stress disorder)  Continue- busPIRone (BUSPAR) 15 MG tablet; Take 1 tablet (15 mg total) by mouth 2 (two) times daily.  Dispense: 60 tablet; Refill: 3 Increased- traZODone (DESYREL) 150 MG tablet; Take 1 tablet (150 mg total) by mouth at bedtime.  Dispense: 30 tablet; Refill: 3  3. Mild depression  Continue- busPIRone (BUSPAR) 15 MG tablet; Take 1 tablet (15 mg total) by mouth 2 (two) times daily.  Dispense: 60 tablet; Refill: 3 Continue- DULoxetine (CYMBALTA) 20 MG capsule; Take 1 capsule (20 mg total) by mouth daily.  Dispense: 30 capsule; Refill: 3 Increased- traZODone (DESYREL) 150 MG tablet; Take 1 tablet (150 mg total) by mouth at bedtime.  Dispense: 30 tablet; Refill: 3     Collaboration of Care: Collaboration of Care: Other provider involved in patient's care AEB PCP and counselor  Patient/Guardian was advised Release of Information must be obtained prior to any record release in order to collaborate their care with an outside provider. Patient/Guardian was advised if they have not already done so to contact the registration department to sign all necessary forms in order for [place] to release information regarding their care.   Consent: Patient/Guardian gives verbal consent for treatment and assignment of benefits for services provided during this visit. Patient/Guardian  expressed understanding and agreed to proceed.   Follow-up in 2.5 months Follow-up therapy Shanna Cisco, NP 02/17/2024, 12:42 PM

## 2024-02-18 ENCOUNTER — Other Ambulatory Visit: Payer: Self-pay | Admitting: Obstetrics and Gynecology

## 2024-02-18 NOTE — Patient Outreach (Signed)
Medicaid Managed Care   Nurse Care Manager Note  02/18/2024 Name:  [name] MRN:  [id number] DOB:  [birth year]  [name] is an 63 y.o. year old female who is [name] primary patient of [name] Doheny, [name].  The [facility] Managed Care Coordination team was consulted for assistance with:    Chronic healthcare management needs, GERD, OSA, HF, CAD, HLD, RLS, COPD, asthna, h/o breast cancer, sciatica, neuropathy, HTN, DM, anxiety/depression/PTSD  [name] was given information about Medicaid Managed Care Coordination team services today. [name] Patient agreed to services and verbal consent obtained.  Engaged with patient by telephone for follow up visit in response to provider referral for case management and/or care coordination services.   Patient is participating in [name] Managed Medicaid Plan:  Yes  Assessments/Interventions:  Review of past medical history, allergies, medications, health status, including review of consultants reports, laboratory and other test data, was performed as part of comprehensive evaluation and provision of chronic care management services.  SDOH (Social Drivers of Health) assessments and interventions performed: SDOH Interventions    Flowsheet Row Patient Outreach Telephone from 02/18/2024 in [facility] Video Visit from 02/17/2024 in [facility] Patient Outreach Telephone from 01/18/2024 in [place] [facility] Patient Outreach Telephone from 12/04/2023 in [place] POP[facility]ATION [facility] DEPARTMENT Patient Outreach Telephone from 10/20/2023 in [place] POPULATION HEALTH DEPARTMENT Patient Outreach Telephone from 09/21/2023 in [place] POPULATION HEALTH DEPARTMENT  SDOH Interventions        Food Insecurity Interventions -- -- -- Intervention Not Indicated -- --  Housing Interventions -- -- -- Intervention Not Indicated -- --  Transportation Interventions  -- -- -- -- -- Intervention Not Indicated  Utilities Interventions -- -- -- -- -- Intervention Not Indicated  Alcohol Usage Interventions Intervention Not Indicated (Score <7) -- -- -- -- --  Depression Interventions/Treatment  -- Currently on Treatment -- -- -- --  Financial Strain Interventions -- -- -- -- Intervention Not Indicated --  Physical Activity Interventions -- -- Other (Comments)  [unable to participate in this type of exercise] -- -- --  Social Connections Interventions -- -- -- -- Intervention Not Indicated --  Health Literacy Interventions -- -- Intervention Not Indicated -- -- --     Care Plan No Known Allergies  Medications Reviewed Today     Reviewed by [name] (Registered Nurse) on 02/18/24 at 1042  Med List Status: <None>   Medication Order Taking? Sig Documenting Provider Last Dose Status Informant  acetaminophen (TYLENOL) 500 MG tablet [id number] No Take 1,000 mg by mouth every 6 (six) hours as needed for moderate pain. Provider, Historical, [name] Taking Active Self  amLODipine (NORVASC) 5 MG tablet [id number] No Take 1 tablet (5 mg total) by mouth daily. [name] Kanner, [name] Taking Active Self  aspirin EC 81 MG tablet [id number] No Take 1 tablet (81 mg total) by mouth daily. Swallow whole. Magnant, [name] Peek, PA-C Taking Active   atorvastatin (LIPITOR) 80 MG tablet [id number]  Take 1 tablet (80 mg total) by mouth daily. [name]  Active   Blood Pressure Monitoring (BLOOD PRESSURE CUFF) MISC [id number] No Please follow insert instructions. [name] Doheny, [name] Taking Active Self  busPIRone (BUSPAR) 15 MG tablet [id number]  Take 1 tablet (15 mg total) by mouth 2 (two) times daily. [name] Cookey [name], [name]  Active   clopidogrel (PLAVIX) 75 MG tablet [id number]  Take 1 tablet (75 mg  total) by mouth every morning. [name]  Active   Continuous Glucose Receiver [facility] G7 RECEIVER) [place] [id number] No 1 Units by Does not apply  route daily. [name] Taking Active   DULoxetine (CYMBALTA) 20 MG capsule [id number]  Take 1 capsule (20 mg total) by mouth daily. [name]  Active   Evolocumab (REPATHA SURECLICK) 140 MG/ML Ivory Broad [id number] No Inject 140 mg into the skin every 14 (fourteen) days. [name] Taking Active Self  ezetimibe (ZETIA) 10 MG tablet [id number]  Take 1 tablet by mouth once daily [name]  Active   Discontinued 05/09/20 1429   Fluticasone-Umeclidin-Vilant (TRELEGY ELLIPTA) 100-62.5-25 MCG/ACT AEPB [id number] No INHALE 1 PUFF ONCE DAILY [name] Doheny, [name] Taking Active   gabapentin (NEURONTIN) 300 MG capsule [id number] No TAKE 1 CAPSULE BY MOUTH IN THE MORNING AND 3 AT BEDTIME [name] Doheny, [name] Taking Active   hydrocortisone cream 1 % [id number] No Apply 1 Application topically daily as needed for itching. Provider, Historical, [name] Taking Active Self  losartan-hydrochlorothiazide (HYZAAR) 50-12.5 MG tablet [id number]  Take 1 tablet by mouth daily. [name]  Active   metFORMIN (GLUCOPHAGE-XR) 500 MG 24 hr tablet [id number] No Take 1 tablet (500 mg total) by mouth 2 (two) times daily. [name] Doheny, [name] Taking Active   metoprolol succinate (TOPROL-XL) 50 MG 24 hr tablet [id number] No Take 1 tablet (50 mg total) by mouth daily. Take with or immediately following [name] meal.TAKE 1 TABLET BY MOUTH ONCE DAILY WITH MEALS OR  IMMEDIATELY  FOLLOWING  [name]  MEAL [name] Taking Active   montelukast (SINGULAIR) 10 MG tablet [id number] No Take 1 tablet (10 mg total) by mouth at bedtime. [name] Doheny, [name] Taking Active   senna-docusate (SENNA-TIME S) 8.6-50 MG tablet [id number] No Take 1 tablet by mouth at bedtime as needed for mild constipation. Briscoe Burns, [name] Taking Active Self           Med Note [name]   Thu Aug 27, 2023  1:05 PM) ONLY AS NEEDED   traZODone (DESYREL) 150 MG tablet  [id number]  Take 1 tablet (150 mg total) by mouth at bedtime. [name]  Active   VENTOLIN HFA 108 (90 Base) MCG/ACT inhaler [id number] No Inhale 2 puffs into the lungs every 6 (six) hours as needed for wheezing or shortness of breath. [name] Taking Active Self  WEGOVY 2.4 MG/0.75ML Ivory Broad [id number]  INJECT 2.4 MG INTO THE SKIN ONCE [name] WEEK [name] Doheny, [name]  Active            Patient Active Problem List   Diagnosis Date Noted   Biceps tendonitis on right 09/19/2023   OA (osteoarthritis) of shoulder 09/15/2023   S/P reverse total shoulder arthroplasty, right 09/15/2023   Chronic constipation 08/15/2023   History of iron deficiency 08/15/2023   Antiplatelet or antithrombotic long-term use 08/15/2023   Abdominal hernia 03/19/2023   Diabetic neuropathy (HCC) 08/29/2022   Vaginal lesion 08/29/2022   PTSD (post-traumatic stress disorder) 06/24/2022   GAD (generalized anxiety disorder) 06/24/2022   Major depressive disorder, recurrent episode, moderate (HCC) 06/24/2022   Urinary incontinence 06/20/2022   Intertrigo 06/20/2022   Arthritis of left shoulder region    S/P reverse total shoulder arthroplasty, 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   Healthcare maintenance 11/17/2021   Spondylolisthesis, lumbar region 02/11/2021    Class: Acute   Malignant neoplasm of upper-outer quadrant of  right breast in female, estrogen receptor positive (HCC) 06/10/2018   Restless leg syndrome 02/04/2018   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   Depression 08/06/2015   Morbid obesity (HCC)    Hypertension associated with diabetes (HCC)    Aortic stenosis s/p TAVR    Chronic diastolic congestive heart failure (HCC)    COPD (chronic obstructive pulmonary disease) (HCC) 09/21/2013   Obstructive sleep apnea 09/21/2013   Nephrolithiasis 09/21/2013   Nocturnal hypoxemia 09/12/2013   Hyperlipidemia 11/30/2012   History of pulmonary embolus (PE) 07/09/2012   Normocytic anemia  02/08/2012   Type 2 diabetes mellitus without complication, without long-term current use of insulin (HCC) 02/08/2012   Asthma 02/08/2012   GERD (gastroesophageal reflux disease)    CAD (coronary artery disease) 12/08/2011   Conditions to be addressed/monitored per PCP order:  Chronic healthcare management needs, GERD, OSA, HF, CAD, HLD, RLS, COPD, asthna, h/o breast cancer, sciatica, neuropathy, HTN, DM, anxiety/depression/PTSD  Care Plan : [name] Care Manager Plan of Care  Updates made by [name] since 02/18/2024 12:00 AM     Problem: Health Promotion or Disease Self-Management (General Plan of Care)   Priority: High  Onset Date: 04/14/2022     Long-Range Goal: Chronic Disease Management and Care Coordination Needs   Start Date: 04/14/2022  Expected End Date: 04/17/2024  Recent Progress: Not on track  Priority: High  Note:   Current Barriers:  Knowledge Deficits related to plan of care for management of Anxiety and Depression along with other chronic health conditions, HTN, DM, HF, asthma, COPD, OSA, arthritis, GERD, CAD, HLD, h/o breast cancer, RLS Chronic Disease Management support and education needs related to Anxiety and Depression. DM, HTN, HF, asthma, OSA, arthritis, GERD, CAD, HLD, h/o breast cancer, RLS. 02/18/24:  UTI and yeast S/S resolved with Jardiance discontinuation.  Blood sugar 135-149.  BP stable.    RNCM Clinical Goal(s):  Patient will verbalize understanding of plan for management of Anxiety and Depression, DM, HTN  as evidenced by patient report take all medications exactly as prescribed and will call provider for medication related questions as evidenced by patient report demonstrate understanding of rationale for each prescribed medication as evidenced by patient report attend all scheduled medical appointments  as evidenced by patient report continue to work with [name] Care Manager to address care management and care coordination needs related to  Anxiety and  Depression, DM, HTN as evidenced by adherence to CM Team Scheduled appointments work with Child psychotherapist to address  related to the management of Mental Health Concerns  related to the management of Anxiety and Depression as evidenced by review of EMR and patient or Child psychotherapist report through collaboration with Medical illustrator, provider, and care team.   Interventions: Inter-disciplinary care team collaboration (see longitudinal plan of care) Evaluation of current treatment plan related to  self management and patient's adherence to plan as established by provider   (Status:  New goal.)  Long Term Goal Evaluation of current treatment plan related to Anxiety and Depression, Mental Health Concerns  self-management and patient's adherence to plan as established by provider. Discussed plans with patient for ongoing care management follow up and provided patient with direct contact information for care management team Advised patient to contact provider for any additional DME needs, CBG, and possible general surgeon referral. Reviewed medications with patient Collaborated with LCSW regarding anxiety/depression Reviewed scheduled/upcoming provider appointments Advised patient, providing education and rationale, to check cbg and record, calling provider  for findings  outside established parameters  Social Work referral for anxiety/depression Discussed plans with patient for ongoing care management follow up and provided patient with direct contact information for care management team Assessed social determinant of health barriers Collaborated with Lubertha Sayres Managed Medicaid Liaison regarding DME-motorized wheelchair and lift chair. LCSW appointment rescheduled. Collaborated with Pharmacist. Pharmacy referral for polypharmacy at patient's request-completed  Asthma: (Status:New goal.) Long Term Goal Advised patient to track and manage Asthma triggers Advised patient to engage in light  exercise as tolerated 3-5 days [name] week to aid in the the management of Asthma Provided education about and advised patient to utilize infection prevention strategies to reduce risk of respiratory infection Discussed the importance of adequate rest and management of fatigue with Asthma Assessed social determinant of health barriers   CAD Interventions: (Status:  New goal.) Long Term Goal Assessed understanding of CAD diagnosis Medications reviewed including medications utilized in CAD treatment plan Provided education on importance of blood pressure control in management of CAD Provided education on Importance of limiting foods high in cholesterol Counseled on importance of regular laboratory monitoring as prescribed Counseled on the importance of exercise goals with target of 150 minutes per week Reviewed Importance of taking all medications as prescribed Reviewed Importance of attending all scheduled provider appointments Advised to report any changes in symptoms or exercise tolerance Assessed social determinant of health barriers  Heart Failure Interventions:  (Status:  New goal.) Long Term Goal Basic overview and discussion of pathophysiology of Heart Failure reviewed Provided education on low sodium diet Assessed need for readable accurate scales in home Provided education about placing scale on hard, flat surface Advised patient to weigh each morning after emptying bladder Discussed importance of daily weight and advised patient to weigh and record daily Discussed the importance of keeping all appointments with provider Provided patient with education about the role of exercise in the management of heart failure Assessed social determinant of health barriers   COPD Interventions:  (Status:  New goal.) Long Term Goal Advised patient to track and manage COPD triggers Advised patient to self assesses COPD action plan zone and make appointment with provider if in the yellow zone for 48  hours without improvement Advised patient to engage in light exercise as tolerated 3-5 days [name] week to aid in the the management of COPD Provided education about and advised patient to utilize infection prevention strategies to reduce risk of respiratory infection Discussed the importance of adequate rest and management of fatigue with COPD Assessed social determinant of health barriers  Hyperlipidemia Interventions:  (Status:  New goal.) Long Term Goal Medication review performed; medication list updated in electronic medical record.  Provider established cholesterol goals reviewed Counseled on importance of regular laboratory monitoring as prescribed Reviewed importance of limiting foods high in cholesterol Reviewed exercise goals and target of 150 minutes per week Assessed social determinant of health barriers    Diabetes Interventions:  (Status:  New goal.) Long Term Goal Assessed patient's understanding of A1c goal: <7% Reviewed medications with patient and discussed importance of medication adherence Reviewed scheduled/upcoming provider appointments  Advised patient, providing education and rationale, to check cbg and record, calling provider for findings outside established parameters Review of patient status, including review of consultants reports, relevant laboratory and other test results, and medications completed Assessed social determinant of health barriers Lab Results  Component Value Date   HGBA1C 6.6 12/01/23   HGBA1C   7.0 on 08/18/23 HGBA1C  7.1 on 09/09/23  Hypertension Interventions:  (Status:  New goal.) Long Term Goal Last practice recorded BP readings:  BP Readings from Last 3 Encounters:       01/29/24          114/72 10/20/23         114/74 12/01/23         124/63  Most recent eGFR/CrCl:  Lab Results  Component Value Date   EGFR 90 06/04/2022    No components found for: "CRCL"  Evaluation of current treatment plan related to hypertension self  management and patient's adherence to plan as established by provider Reviewed medications with patient and discussed importance of compliance Discussed plans with patient for ongoing care management follow up and provided patient with direct contact information for care management team Advised patient, providing education and rationale, to monitor blood pressure daily and record, calling PCP for findings outside established parameters Reviewed scheduled/upcoming provider appointments including:  Assessed social determinant of health barriers   Patient Goals/Self-Care Activities: Take all medications as prescribed Attend all scheduled provider appointments Call pharmacy for medication refills 3-7 days in advance of running out of medications Perform all self care activities independently  Perform IADL's (shopping, preparing meals, housekeeping, managing finances) independently Call provider office for new concerns or questions  Work with the social worker to address care coordination needs and will continue to work with the clinical team to address health care and disease management related needs Patient to f/u with provider regarding sciatica pain-completed  Follow Up Plan:  The patient has been provided with contact information for the care management team and has been advised to call with any health related questions or concerns.  The care management team will reach out to the patient again over the next 30 business  days.     Long-Range Goal: Establish Plan of Care for Chronic Disease Management Needs   Priority: High  Note:   Timeframe:  Long-Range Goal Priority:  High Start Date:      04/14/22                       Expected End Date:     ongoing                  Follow Up Date: 03/17/24   - schedule appointment for flu shot - schedule appointment for vaccines needed due to my age or health - schedule recommended health tests (blood work, mammogram, colonoscopy, pap test) -  schedule and keep appointment for annual check-up    Why is this important?   Screening tests can find diseases early when they are easier to treat.  Your doctor or nurse will talk with you about which tests are important for you.  Getting shots for common diseases like the flu and shingles will help prevent them.  02/18/24: Recently seen by [facility] and ORTHO, To have ECHO 2/25 with f/u [facility] appt   Follow Up:  Patient agrees to Care Plan and Follow-up.  Plan: The Managed Medicaid care management team will reach out to the patient again over the next 30 business  days. and The  Patient has been provided with contact information for the Managed Medicaid care management team and has been advised to call with any health related questions or concerns.  Date/time of next scheduled [name] care management/care coordination outreach:  03/17/24 at 230.

## 2024-02-18 NOTE — Patient Instructions (Signed)
Visit Information  [name] was given information about Medicaid Managed Care team care coordination services as a part of their [facility] Medicaid benefit. [name] verbally consented to engagement with the [facility] Managed Care team.   If you are experiencing a medical emergency, please call 911 or report to your local emergency department or urgent care.   If you have a non-emergency medical problem during routine business hours, please contact your provider's office and ask to speak with a nurse.   For questions related to your [facility] health plan, please call: [phone] or go here:[email or web address]  If you would like to schedule transportation through your [facility] plan, please call the following number at least 2 days in advance of your appointment: [phone].   You can also use the MTM portal or MTM mobile app to manage your rides. Reimbursement for transportation is available through [facility]! For the portal, please go to [email or web address]  Call the [facility] Crisis Line at [phone], at any time, 24 hours a day, 7 days a week. If you are in danger or need immediate medical attention call 911.  If you would like help to quit smoking, call 1-800-QUIT-NOW (504-070-5957) OR Espaol: 1-855-Djelo-Ya (2-725-366-4403) o para ms informacin haga clic aqu or Text READY to 474-259 to register via text  [name] - following are the goals we discussed in your visit today:   Goals Addressed    Timeframe:  Long-Range Goal Priority:  High Start Date:      04/14/22                       Expected End Date:     ongoing                  Follow Up Date: 03/17/24   - schedule appointment for flu shot - schedule appointment for vaccines needed due to my age or health - schedule recommended health tests (blood work, mammogram, colonoscopy, pap test) - schedule and keep appointment for annual check-up    Why is this important?    Screening tests can find diseases early when they are easier to treat.  Your doctor or nurse will talk with you about which tests are important for you.  Getting shots for common diseases like the flu and shingles will help prevent them.  02/18/24: Recently seen by CARDS and ORTHO, To have ECHO 2/25 with f/u CARDS appt  Patient verbalizes understanding of instructions and care plan provided today and agrees to view in MyChart. Active MyChart status and patient understanding of how to access instructions and care plan via MyChart confirmed with patient.     The Managed Medicaid care management team will [name] out to the patient again over the next 30 business  days.  The  Patient  has been provided with contact information for the Managed Medicaid care management team and has been advised to call with any health related questions or concerns.   [name] RN, BSN, [employer] [facility] Health RN Care Manager Direct Dial [phone]/IRJ [phone] Website: [email or web address]    Following is a copy of your plan of care:  Care Plan : RN Care Manager Plan of Care  Updates made by [name], RN since 02/18/2024 12:00 AM     Problem: Health Promotion or Disease Self-Management (General Plan of Care)   Priority: High  Onset Date: 04/14/2022     Long-Range Goal: Chronic Disease Management and Care Coordination Needs  Start Date: 04/14/2022  Expected End Date: 04/17/2024  Recent Progress: Not on track  Priority: High  Note:   Current Barriers:  Knowledge Deficits related to plan of care for management of Anxiety and Depression along with other chronic health conditions, HTN, DM, HF, asthma, COPD, OSA, arthritis, GERD, CAD, HLD, h/o breast cancer, RLS Chronic Disease Management support and education needs related to Anxiety and Depression. DM, HTN, HF, asthma, OSA, arthritis, GERD, CAD, HLD, h/o breast cancer, RLS. 02/18/24:  UTI and yeast S/S resolved with Jardiance  discontinuation.  Blood sugar 135-149.  BP stable.    RNCM Clinical Goal(s):  Patient will verbalize understanding of plan for management of Anxiety and Depression, DM, HTN  as evidenced by patient report take all medications exactly as prescribed and will call provider for medication related questions as evidenced by patient report demonstrate understanding of rationale for each prescribed medication as evidenced by patient report attend all scheduled medical appointments  as evidenced by patient report continue to work with RN Care Manager to address care management and care coordination needs related to  Anxiety and Depression, DM, HTN as evidenced by adherence to CM Team Scheduled appointments work with Child psychotherapist to address  related to the management of Mental Health Concerns  related to the management of Anxiety and Depression as evidenced by review of EMR and patient or Child psychotherapist report through collaboration with Medical illustrator, provider, and care team.   Interventions: Inter-disciplinary care team collaboration (see longitudinal plan of care) Evaluation of current treatment plan related to  self management and patient's adherence to plan as established by provider   (Status:  New goal.)  Long Term Goal Evaluation of current treatment plan related to Anxiety and Depression, Mental Health Concerns  self-management and patient's adherence to plan as established by provider. Discussed plans with patient for ongoing care management follow up and provided patient with direct contact information for care management team Advised patient to contact provider for any additional DME needs, CBG, and possible general surgeon referral. Reviewed medications with patient Collaborated with LCSW regarding anxiety/depression Reviewed scheduled/upcoming provider appointments Advised patient, providing education and rationale, to check cbg and record, calling provider  for findings outside established  parameters  Social Work referral for anxiety/depression Discussed plans with patient for ongoing care management follow up and provided patient with direct contact information for care management team Assessed social determinant of health barriers Collaborated with Lubertha Sayres Managed Medicaid Liaison regarding DME-motorized wheelchair and lift chair. LCSW appointment rescheduled. Collaborated with Pharmacist. Pharmacy referral for polypharmacy at patient's request-completed  Asthma: (Status:New goal.) Long Term Goal Advised patient to track and manage Asthma triggers Advised patient to engage in light exercise as tolerated 3-5 days a week to aid in the the management of Asthma Provided education about and advised patient to utilize infection prevention strategies to reduce risk of respiratory infection Discussed the importance of adequate rest and management of fatigue with Asthma Assessed social determinant of health barriers   CAD Interventions: (Status:  New goal.) Long Term Goal Assessed understanding of CAD diagnosis Medications reviewed including medications utilized in CAD treatment plan Provided education on importance of blood pressure control in management of CAD Provided education on Importance of limiting foods high in cholesterol Counseled on importance of regular laboratory monitoring as prescribed Counseled on the importance of exercise goals with target of 150 minutes per week Reviewed Importance of taking all medications as prescribed Reviewed Importance of attending all scheduled provider appointments Advised  to report any changes in symptoms or exercise tolerance Assessed social determinant of health barriers  Heart Failure Interventions:  (Status:  New goal.) Long Term Goal Basic overview and discussion of pathophysiology of Heart Failure reviewed Provided education on low sodium diet Assessed need for readable accurate scales in home Provided education  about placing scale on hard, flat surface Advised patient to weigh each morning after emptying bladder Discussed importance of daily weight and advised patient to weigh and record daily Discussed the importance of keeping all appointments with provider Provided patient with education about the role of exercise in the management of heart failure Assessed social determinant of health barriers   COPD Interventions:  (Status:  New goal.) Long Term Goal Advised patient to track and manage COPD triggers Advised patient to self assesses COPD action plan zone and make appointment with provider if in the yellow zone for 48 hours without improvement Advised patient to engage in light exercise as tolerated 3-5 days a week to aid in the the management of COPD Provided education about and advised patient to utilize infection prevention strategies to reduce risk of respiratory infection Discussed the importance of adequate rest and management of fatigue with COPD Assessed social determinant of health barriers  Hyperlipidemia Interventions:  (Status:  New goal.) Long Term Goal Medication review performed; medication list updated in electronic medical record.  Provider established cholesterol goals reviewed Counseled on importance of regular laboratory monitoring as prescribed Reviewed importance of limiting foods high in cholesterol Reviewed exercise goals and target of 150 minutes per week Assessed social determinant of health barriers    Diabetes Interventions:  (Status:  New goal.) Long Term Goal Assessed patient's understanding of A1c goal: <7% Reviewed medications with patient and discussed importance of medication adherence Reviewed scheduled/upcoming provider appointments  Advised patient, providing education and rationale, to check cbg and record, calling provider for findings outside established parameters Review of patient status, including review of consultants reports, relevant laboratory  and other test results, and medications completed Assessed social determinant of health barriers Lab Results  Component Value Date   HGBA1C 6.6 12/01/23   HGBA1C   7.0 on 08/18/23 HGBA1C  7.1 on 09/09/23  Hypertension Interventions:  (Status:  New goal.) Long Term Goal Last practice recorded BP readings:  BP Readings from Last 3 Encounters:       01/29/24          114/72 10/20/23         114/74 12/01/23         124/63  Most recent eGFR/CrCl:  Lab Results  Component Value Date   EGFR 90 06/04/2022    No components found for: "CRCL"  Evaluation of current treatment plan related to hypertension self management and patient's adherence to plan as established by provider Reviewed medications with patient and discussed importance of compliance Discussed plans with patient for ongoing care management follow up and provided patient with direct contact information for care management team Advised patient, providing education and rationale, to monitor blood pressure daily and record, calling PCP for findings outside established parameters Reviewed scheduled/upcoming provider appointments including:  Assessed social determinant of health barriers   Patient Goals/Self-Care Activities: Take all medications as prescribed Attend all scheduled provider appointments Call pharmacy for medication refills 3-7 days in advance of running out of medications Perform all self care activities independently  Perform IADL's (shopping, preparing meals, housekeeping, managing finances) independently Call provider office for new concerns or questions  Work with the social worker  to address care coordination needs and will continue to work with the clinical team to address health care and disease management related needs Patient to f/u with provider regarding sciatica pain-completed  Follow Up Plan:  The patient has been provided with contact information for the care management team and has been advised to call  with any health related questions or concerns.  The care management team will [name] out to the patient again over the next 30 business  days.

## 2024-02-22 ENCOUNTER — Telehealth: Payer: Self-pay

## 2024-02-22 NOTE — Telephone Encounter (Signed)
 Pharmacy Patient Advocate Encounter   Received notification from CoverMyMeds that prior authorization for [facility] G7 RECEIVER is required/requested.  The patient is insured through [facility] .   PA required; PA submitted to above mentioned insurance via CoverMyMeds Key/confirmation #/EOC [id number]. Status is pending

## 2024-02-23 ENCOUNTER — Ambulatory Visit (HOSPITAL_COMMUNITY): Payer: Medicaid Other | Attending: Cardiovascular Disease

## 2024-02-23 DIAGNOSIS — R0609 Other forms of dyspnea: Secondary | ICD-10-CM | POA: Insufficient documentation

## 2024-02-23 LAB — ECHOCARDIOGRAM COMPLETE
AR max vel: 2.24 cm2
AV Area VTI: 2.02 cm2
AV Area mean vel: 2.08 cm2
AV Mean grad: 17.2 mm[Hg]
AV Peak grad: 28 mm[Hg]
Ao pk vel: 2.65 m/s
Area-P 1/2: 1.54 cm2
Calc EF: 65.7 %
MV VTI: 2.15 cm2
S' Lateral: 3.12 cm
Single Plane A2C EF: 65.8 %
Single Plane A4C EF: 66.4 %

## 2024-02-23 NOTE — Telephone Encounter (Signed)
 Patient must have insulin-dependent diabetes.

## 2024-02-23 NOTE — Telephone Encounter (Signed)
 [name] (Key: [id number]) PA Case ID #: [id number] Rx #: [id number] Need Help? Call us at [phone] Outcome Denied on February 24 by [facility] Medicaid 2017 Denied. The following criteria from the health plan guideline, Systems (CGM) and Related Supplies, must be met before we can approve this request. Please send [place] supporting chart notes and lab results. 1. the beneficiary has a diagnosis of insulin-dependent diabetes Drug Dexcom G7 Sensor ePA cloud logo Form [facility] Medicaid of [employer] Electronic Prior Authorization Request Form [phone] NCPDP) Original Claim Info 45

## 2024-02-24 ENCOUNTER — Encounter: Payer: Self-pay | Admitting: Physician Assistant

## 2024-02-27 DIAGNOSIS — Z419 Encounter for procedure for purposes other than remedying health state, unspecified: Secondary | ICD-10-CM | POA: Diagnosis not present

## 2024-02-29 ENCOUNTER — Telehealth: Payer: Self-pay

## 2024-02-29 ENCOUNTER — Other Ambulatory Visit: Payer: Self-pay | Admitting: Internal Medicine

## 2024-02-29 MED ORDER — DEXCOM G7 SENSOR MISC: 3 refills | Status: DC

## 2024-02-29 NOTE — Telephone Encounter (Signed)
 Pt is requesting her Dexcom sensor to be refilled but it is not on her list    Pharmacy : [facility] [address]

## 2024-02-29 NOTE — Progress Notes (Signed)
 Refilled Dexcom sensors.

## 2024-03-04 ENCOUNTER — Encounter: Payer: Self-pay | Admitting: *Deleted

## 2024-03-04 NOTE — Progress Notes (Signed)
 Patient unable to receive Sensors .  Needs PA done.

## 2024-03-05 ENCOUNTER — Other Ambulatory Visit: Payer: Self-pay | Admitting: Student

## 2024-03-07 ENCOUNTER — Telehealth: Payer: Self-pay | Admitting: *Deleted

## 2024-03-07 NOTE — Telephone Encounter (Signed)
 Copied from CRM [id number]. Topic: Clinical - Prescription Issue >> Mar 04, 2024 11:37 AM Dennison Nancy wrote: Reason for CRM: Patient pharmacy stated need a prior authorization for the Continuous Glucose Sensor (DEXCOM G7 SENSOR) MISC , reason patient insurance will not pay for the [facility] G7 sensor ,  Patient is out of the dexcom g7 sensor  [employer] [address] [place], [place] - [address] [place] [address] Phone: [phone] Fax: [phone] Hours: Not open 24 hours

## 2024-03-07 NOTE — Telephone Encounter (Signed)
 Prior Authorization for patient was submitted on 02/22/24 (please see note)  per Camille's note the authorization was denied "The patient must have insulin-dependent diabetes. "

## 2024-03-08 NOTE — Telephone Encounter (Signed)
 Patient has been off of insulin since follow-up 2023.  Unfortunately it looks like insurance will not cover CGM if she is not insulin-dependent.

## 2024-03-16 ENCOUNTER — Telehealth: Payer: Self-pay

## 2024-03-16 NOTE — Telephone Encounter (Signed)
 Prior Authorization for patient (Dexcom G7 Sensor) came through on cover my meds was submitted per cover my meds.Marland Kitchen  KEY:[id number]   [name] (Key: [id number]) Rx #: [id number] Need Help? Call us at [phone] Outcome Additional Information Required System was not able to process the request because the previous Prior Authorization Request was Denied. Drug Dexcom G7 Sensor ePA cloud logo Form [facility] Medicaid of [facility] Electronic Prior Authorization Request Form [phone] NCPDP) Original Claim Info 12  Patient must have insulin-dependent diabetes.

## 2024-03-16 NOTE — Telephone Encounter (Signed)
 Will forward to PCP and to Amparo Bristol CMA to check on PA.

## 2024-03-17 ENCOUNTER — Other Ambulatory Visit: Payer: Self-pay | Admitting: Obstetrics and Gynecology

## 2024-03-17 NOTE — Patient Outreach (Signed)
 Medicaid Managed Care   Nurse Care Manager Note  03/17/2024 Name:  [name] MRN:  [id number] DOB:  May 11, 1961  [name] is an 63 y.o. year old female who is a primary patient of Philomena Doheny, MD.  The Medicaid Managed Care Coordination team was consulted for assistance with:    Chronic healthcare management needs, HTN, DM, GERD, OSA, HF, CAD, HLD, RLS, COPD, asthma, h/o breast cancer, sciatica, neuropathy, anxiety/depression/PTSD  [name] was given information about Medicaid Managed Care Coordination team services today. [name] Patient agreed to services and verbal consent obtained.  Engaged with patient by telephone for follow up visit in response to provider referral for case management and/or care coordination services.   Patient is participating in a Managed Medicaid Plan:  Yes  Assessments/Interventions:  Review of past medical history, allergies, medications, health status, including review of consultants reports, laboratory and other test data, was performed as part of comprehensive evaluation and provision of chronic care management services.  SDOH (Social Drivers of Health) assessments and interventions performed: SDOH Interventions    Flowsheet Row Patient Outreach Telephone from 03/17/2024 in [facility] Patient Outreach Telephone from 02/18/2024 in [place] [facility] Patient Outreach Telephone from 01/18/2024 in [place] [facility] DEPARTMENT Patient Outreach Telephone from 12/04/2023 in [place] POP[facility] DEPARTMENT Patient Outreach Telephone from 10/20/2023 in [place] POP[facility]ATION [facility] DEPARTMENT Patient Outreach Telephone from 09/21/2023 in [place] POPULATION HEALTH DEPARTMENT  SDOH Interventions        Food Insecurity Interventions -- -- -- Intervention Not Indicated -- --  Housing Interventions -- -- -- Intervention Not Indicated -- --  Transportation  Interventions -- -- -- -- -- Intervention Not Indicated  Utilities Interventions Intervention Not Indicated -- -- -- -- Intervention Not Indicated  Alcohol Usage Interventions -- Intervention Not Indicated (Score <7) -- -- -- --  Financial Strain Interventions -- -- -- -- Intervention Not Indicated --  Physical Activity Interventions -- -- Other (Comments)  [unable to participate in this type of exercise] -- -- --  Stress Interventions Intervention Not Indicated -- -- -- -- --  Social Connections Interventions -- -- -- -- Intervention Not Indicated --  Health Literacy Interventions -- -- Intervention Not Indicated -- -- --     Care Plan No Known Allergies  Medications Reviewed Today     Reviewed by [name], RN (Registered Nurse) on 03/17/24 at 1439  Med List Status: <None>   Medication Order Taking? Sig Documenting Provider Last Dose Status Informant  acetaminophen (TYLENOL) 500 MG tablet [id number] No Take 1,000 mg by mouth every 6 (six) hours as needed for moderate pain. [provider] Taking Active Self  amLODipine (NORVASC) 5 MG tablet [id number] No Take 1 tablet (5 mg total) by mouth daily. Evlyn Kanner, MD Taking Active Self  aspirin EC 81 MG tablet [id number] No Take 1 tablet (81 mg total) by mouth daily. Swallow whole. Magnant, Joycie Peek, PA-C Taking Active   atorvastatin (LIPITOR) 80 MG tablet [id number]  Take 1 tablet (80 mg total) by mouth daily. Kathleene Hazel, MD  Active   Blood Pressure Monitoring (BLOOD PRESSURE CUFF) MISC [id number] No Please follow insert instructions. Philomena Doheny, MD Taking Active Self  busPIRone (BUSPAR) 15 MG tablet [id number]  Take 1 tablet (15 mg total) by mouth 2 (two) times daily. [name] E, NP  Active   clopidogrel (PLAVIX) 75 MG tablet [id number]  Take 1 tablet (  75 mg total) by mouth every morning. Kathleene Hazel, MD  Active   Continuous Glucose Receiver [facility] G7 RECEIVER) [place] [id number] No  1 Units by Does not apply route daily. Faith Rogue, DO Taking Active   Continuous Glucose Sensor (DEXCOM G7 SENSOR) MISC [id number]  Place new sensor every 10 days. Use to monitor blood sugar continuously. Chauncey Mann, DO  Active   DULoxetine (CYMBALTA) 20 MG capsule [id number]  Take 1 capsule (20 mg total) by mouth daily. Shanna Cisco, NP  Active   Evolocumab (REPATHA SURECLICK) 140 MG/ML Ivory Broad [id number] No Inject 140 mg into the skin every 14 (fourteen) days. Reymundo Poll, MD Taking Active Self  ezetimibe (ZETIA) 10 MG tablet [id number]  Take 1 tablet by mouth once daily Kathleene Hazel, MD  Active   Discontinued 05/09/20 1429   Fluticasone-Umeclidin-Vilant (TRELEGY ELLIPTA) 100-62.5-25 MCG/ACT AEPB [id number] No INHALE 1 PUFF ONCE DAILY Philomena Doheny, MD Taking Active   gabapentin (NEURONTIN) 300 MG capsule [id number] No TAKE 1 CAPSULE BY MOUTH IN THE MORNING AND 3 AT BEDTIME Philomena Doheny, MD Taking Active   hydrocortisone cream 1 % [id number] No Apply 1 Application topically daily as needed for itching. [provider] Taking Active Self  losartan-hydrochlorothiazide (HYZAAR) 50-12.5 MG tablet [id number]  Take 1 tablet by mouth daily. Kathleene Hazel, MD  Active   metFORMIN (GLUCOPHAGE-XR) 500 MG 24 hr tablet [id number] No Take 1 tablet (500 mg total) by mouth 2 (two) times daily. Philomena Doheny, MD Taking Active   metoprolol succinate (TOPROL-XL) 50 MG 24 hr tablet [id number] No Take 1 tablet (50 mg total) by mouth daily. Take with or immediately following a meal.TAKE 1 TABLET BY MOUTH ONCE DAILY WITH MEALS OR  IMMEDIATELY  FOLLOWING  A  MEAL Colbert Coyer, Priscila, MD Taking Active   montelukast (SINGULAIR) 10 MG tablet [id number] No Take 1 tablet (10 mg total) by mouth at bedtime. Philomena Doheny, MD Taking Active   Semaglutide-Weight Management [facility]) 2.4 MG/0.75ML Ivory Broad [id number]  INJECT 2.4 MG INTO THE SKIN ONCE  A WEEK At[place], Rayann N, DO  Active   senna-docusate (SENNA-TIME S) 8.6-50 MG tablet [id number] No Take 1 tablet by mouth at bedtime as needed for mild constipation. Briscoe Burns, MD Taking Active Self           Med Note Gunnar Fusi, MELISSA R   Thu Aug 27, 2023  1:05 PM) ONLY AS NEEDED   traZODone (DESYREL) 150 MG tablet [id number]  Take 1 tablet (150 mg total) by mouth at bedtime. Shanna Cisco, NP  Active   VENTOLIN HFA 108 (90 Base) MCG/ACT inhaler [id number] No Inhale 2 puffs into the lungs every 6 (six) hours as needed for wheezing or shortness of breath. Faith Rogue, DO Taking Active Self           Patient Active Problem List   Diagnosis Date Noted   Biceps tendonitis on right 09/19/2023   OA (osteoarthritis) of shoulder 09/15/2023   S/P reverse total shoulder arthroplasty, right 09/15/2023   Chronic constipation 08/15/2023   History of iron deficiency 08/15/2023   Antiplatelet or antithrombotic long-term use 08/15/2023   Abdominal hernia 03/19/2023   Diabetic neuropathy (HCC) 08/29/2022   Vaginal lesion 08/29/2022   PTSD (post-traumatic stress disorder) 06/24/2022   GAD (generalized anxiety disorder) 06/24/2022   Major depressive disorder, recurrent episode, moderate (HCC) 06/24/2022   Urinary incontinence 06/20/2022   Intertrigo 06/20/2022   Arthritis of left shoulder region  S/P reverse total shoulder arthroplasty, 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   Healthcare maintenance 11/17/2021   Spondylolisthesis, lumbar region 02/11/2021    Class: Acute   Malignant neoplasm of upper-outer quadrant of right breast in female, estrogen receptor positive (HCC) 06/10/2018   Restless leg syndrome 02/04/2018   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   Depression 08/06/2015   Morbid obesity (HCC)    Hypertension associated with diabetes (HCC)    Aortic stenosis s/p TAVR    Chronic diastolic congestive heart failure (HCC)    COPD (chronic obstructive pulmonary disease) (HCC)  09/21/2013   Obstructive sleep apnea 09/21/2013   Nephrolithiasis 09/21/2013   Nocturnal hypoxemia 09/12/2013   Hyperlipidemia 11/30/2012   History of pulmonary embolus (PE) 07/09/2012   Normocytic anemia 02/08/2012   Type 2 diabetes mellitus without complication, without long-term current use of insulin (HCC) 02/08/2012   Asthma 02/08/2012   GERD (gastroesophageal reflux disease)    CAD (coronary artery disease) 12/08/2011   Conditions to be addressed/monitored per PCP order:  Chronic healthcare management needs, HTN, DM, GERD, OSA, HF, CAD, HLD, RLS, COPD, asthma, h/o breast cancer, sciatica, neuropathy, anxiety/depression/PTSD  Care Plan : RN Care Manager Plan of Care  Updates made by [name], RN since 03/17/2024 12:00 AM     Problem: Health Promotion or Disease Self-Management (General Plan of Care)   Priority: High  Onset Date: 04/14/2022     Long-Range Goal: Chronic Disease Management and Care Coordination Needs   Start Date: 04/14/2022  Expected End Date: 06/17/2024  Recent Progress: Not on track  Priority: High  Note:   Current Barriers:  Knowledge Deficits related to plan of care for management of Anxiety and Depression along with other chronic health conditions, HTN, DM, HF, asthma, COPD, OSA, arthritis, GERD, CAD, HLD, h/o breast cancer, RLS Chronic Disease Management support and education needs related to Anxiety and Depression. DM, HTN, HF, asthma, OSA, arthritis, GERD, CAD, HLD, h/o breast cancer, RLS. 03/17/24:  Not checking blood sugar or BP right now-to f/u on new meter.  ECHO completed.  CARDS f/u in April.  Recovering from cold and feeling better.  RNCM Clinical Goal(s):  Patient will verbalize understanding of plan for management of Anxiety and Depression, DM, HTN  as evidenced by patient report take all medications exactly as prescribed and will call provider for medication related questions as evidenced by patient report demonstrate understanding of  rationale for each prescribed medication as evidenced by patient report attend all scheduled medical appointments  as evidenced by patient report continue to work with RN Care Manager to address care management and care coordination needs related to  Anxiety and Depression, DM, HTN as evidenced by adherence to CM Team Scheduled appointments work with Child psychotherapist to address  related to the management of Mental Health Concerns  related to the management of Anxiety and Depression as evidenced by review of EMR and patient or Child psychotherapist report through collaboration with Medical illustrator, provider, and care team.   Interventions: Inter-disciplinary care team collaboration (see longitudinal plan of care) Evaluation of current treatment plan related to  self management and patient's adherence to plan as established by provider   (Status:  New goal.)  Long Term Goal Evaluation of current treatment plan related to Anxiety and Depression, Mental Health Concerns  self-management and patient's adherence to plan as established by provider. Discussed plans with patient for ongoing care management follow up and provided patient with direct contact information for care management team Advised patient to contact  provider for any additional DME needs, CBG, and possible general surgeon referral. Reviewed medications with patient Collaborated with LCSW regarding anxiety/depression Reviewed scheduled/upcoming provider appointments Advised patient, providing education and rationale, to check cbg and record, calling provider  for findings outside established parameters  Social Work referral for anxiety/depression Discussed plans with patient for ongoing care management follow up and provided patient with direct contact information for care management team Assessed social determinant of health barriers Collaborated with Lubertha Sayres Managed Medicaid Liaison regarding DME-motorized wheelchair and lift  chair. LCSW appointment rescheduled. Collaborated with Pharmacist. Pharmacy referral for polypharmacy at patient's request-completed  Asthma: (Status:New goal.) Long Term Goal Advised patient to track and manage Asthma triggers Advised patient to engage in light exercise as tolerated 3-5 days a week to aid in the the management of Asthma Provided education about and advised patient to utilize infection prevention strategies to reduce risk of respiratory infection Discussed the importance of adequate rest and management of fatigue with Asthma Assessed social determinant of health barriers   CAD Interventions: (Status:  New goal.) Long Term Goal Assessed understanding of CAD diagnosis Medications reviewed including medications utilized in CAD treatment plan Provided education on importance of blood pressure control in management of CAD Provided education on Importance of limiting foods high in cholesterol Counseled on importance of regular laboratory monitoring as prescribed Counseled on the importance of exercise goals with target of 150 minutes per week Reviewed Importance of taking all medications as prescribed Reviewed Importance of attending all scheduled provider appointments Advised to report any changes in symptoms or exercise tolerance Assessed social determinant of health barriers  Heart Failure Interventions:  (Status:  New goal.) Long Term Goal Basic overview and discussion of pathophysiology of Heart Failure reviewed Provided education on low sodium diet Assessed need for readable accurate scales in home Provided education about placing scale on hard, flat surface Advised patient to weigh each morning after emptying bladder Discussed importance of daily weight and advised patient to weigh and record daily Discussed the importance of keeping all appointments with provider Provided patient with education about the role of exercise in the management of heart failure Assessed  social determinant of health barriers   COPD Interventions:  (Status:  New goal.) Long Term Goal Advised patient to track and manage COPD triggers Advised patient to self assesses COPD action plan zone and make appointment with provider if in the yellow zone for 48 hours without improvement Advised patient to engage in light exercise as tolerated 3-5 days a week to aid in the the management of COPD Provided education about and advised patient to utilize infection prevention strategies to reduce risk of respiratory infection Discussed the importance of adequate rest and management of fatigue with COPD Assessed social determinant of health barriers  Hyperlipidemia Interventions:  (Status:  New goal.) Long Term Goal Medication review performed; medication list updated in electronic medical record.  Provider established cholesterol goals reviewed Counseled on importance of regular laboratory monitoring as prescribed Reviewed importance of limiting foods high in cholesterol Reviewed exercise goals and target of 150 minutes per week Assessed social determinant of health barriers    Diabetes Interventions:  (Status:  New goal.) Long Term Goal Assessed patient's understanding of A1c goal: <7% Reviewed medications with patient and discussed importance of medication adherence Reviewed scheduled/upcoming provider appointments  Advised patient, providing education and rationale, to check cbg and record, calling provider for findings outside established parameters Review of patient status, including review of consultants reports, relevant laboratory and  other test results, and medications completed Assessed social determinant of health barriers Lab Results  Component Value Date   HGBA1C 6.6 12/01/23   HGBA1C   7.0 on 08/18/23 HGBA1C  7.1 on 09/09/23  Hypertension Interventions:  (Status:  New goal.) Long Term Goal Last practice recorded BP readings:  BP Readings from Last 3 Encounters:        01/29/24          114/72 10/20/23         114/74 12/01/23         124/63  Most recent eGFR/CrCl:  Lab Results  Component Value Date   EGFR 90 06/04/2022    No components found for: "CRCL"  Evaluation of current treatment plan related to hypertension self management and patient's adherence to plan as established by provider Reviewed medications with patient and discussed importance of compliance Discussed plans with patient for ongoing care management follow up and provided patient with direct contact information for care management team Advised patient, providing education and rationale, to monitor blood pressure daily and record, calling PCP for findings outside established parameters Reviewed scheduled/upcoming provider appointments including:  Assessed social determinant of health barriers   Patient Goals/Self-Care Activities: Take all medications as prescribed Attend all scheduled provider appointments Call pharmacy for medication refills 3-7 days in advance of running out of medications Perform all self care activities independently  Perform IADL's (shopping, preparing meals, housekeeping, managing finances) independently Call provider office for new concerns or questions  Work with the social worker to address care coordination needs and will continue to work with the clinical team to address health care and disease management related needs Patient to f/u with provider regarding sciatica pain-completed 03/17/24: patient to f/u with provider regarding new meter.  Follow Up Plan:  The patient has been provided with contact information for the care management team and has been advised to call with any health related questions or concerns.  The care management team will reach out to the patient again over the next 45 business  days.    Long-Range Goal: Establish Plan of Care for Chronic Disease Management Needs   Priority: High  Note:   Timeframe:  Long-Range Goal Priority:   High Start Date:      04/14/22                       Expected End Date:     ongoing                  Follow Up Date: 04/29/24   - schedule appointment for flu shot - schedule appointment for vaccines needed due to my age or health - schedule recommended health tests (blood work, mammogram, colonoscopy, pap test) - schedule and keep appointment for annual check-up    Why is this important?   Screening tests can find diseases early when they are easier to treat.  Your doctor or nurse will talk with you about which tests are important for you.  Getting shots for common diseases like the flu and shingles will help prevent them.  03/17/24: CARDS f/u in April.  To follow up on new glucose meter   Follow Up:  Patient agrees to Care Plan and Follow-up.  Plan: The Managed Medicaid care management team will reach out to the patient again over the next 45 business  days. and The  Patient has been provided with contact information for the Managed Medicaid care management team and has been  advised to call with any health related questions or concerns.  Date/time of next scheduled RN care management/care coordination outreach: 04/29/24 at 0900.

## 2024-03-17 NOTE — Patient Instructions (Signed)
 Visit Information  [name] was given information about Medicaid Managed Care team care coordination services as a part of their [facility] Medicaid benefit. [name] verbally consented to engagement with the [facility] Managed Care team.   If you are experiencing a medical emergency, please call 911 or report to your local emergency department or urgent care.   If you have a non-emergency medical problem during routine business hours, please contact your provider's office and ask to speak with a nurse.   For questions related to your [facility] health plan, please call: [phone] or go here:[email or web address]  If you would like to schedule transportation through your [facility] plan, please call the following number at least 2 days in advance of your appointment: [phone].   You can also use the MTM portal or MTM mobile app to manage your rides. Reimbursement for transportation is available through [facility]! For the portal, please go to [email or web address]  Call the [facility] Crisis Line at [phone], at any time, 24 hours a day, 7 days a week. If you are in danger or need immediate medical attention call 911.  If you would like help to quit smoking, call 1-800-QUIT-NOW (930-528-9326) OR Espaol: 1-855-Djelo-Ya (0-347-425-9563) o para ms informacin haga clic aqu or Text READY to 875-643 to register via text  [name] - following are the goals we discussed in your visit today:   Goals Addressed    Timeframe:  Long-Range Goal Priority:  High Start Date:      04/14/22                       Expected End Date:     ongoing                  Follow Up Date: 04/29/24   - schedule appointment for flu shot - schedule appointment for vaccines needed due to my age or health - schedule recommended health tests (blood work, mammogram, colonoscopy, pap test) - schedule and keep appointment for annual check-up    Why is this important?    Screening tests can find diseases early when they are easier to treat.  Your doctor or nurse will talk with you about which tests are important for you.  Getting shots for common diseases like the flu and shingles will help prevent them.  03/17/24: CARDS f/u in April.  To follow up on new glucose meter  Patient verbalizes understanding of instructions and care plan provided today and agrees to view in MyChart. Active MyChart status and patient understanding of how to access instructions and care plan via MyChart confirmed with patient.     The Managed Medicaid care management team will [name] out to the patient again over the next 45 business  days.  The  Patient  has been provided with contact information for the Managed Medicaid care management team and has been advised to call with any health related questions or concerns.   [name], BSN, [employer] [facility] RN Care Manager Direct Dial [phone] Website: [email or web address]   Following is a copy of your plan of care:  Care Plan : RN Care Manager Plan of Care  Updates made by [name], RN since 03/17/2024 12:00 AM     Problem: Health Promotion or Disease Self-Management (General Plan of Care)   Priority: High  Onset Date: 04/14/2022     Long-Range Goal: Chronic Disease Management and Care Coordination Needs   Start  Date: 04/14/2022  Expected End Date: 06/17/2024  Recent Progress: Not on track  Priority: High  Note:   Current Barriers:  Knowledge Deficits related to plan of care for management of Anxiety and Depression along with other chronic health conditions, HTN, DM, HF, asthma, COPD, OSA, arthritis, GERD, CAD, HLD, h/o breast cancer, RLS Chronic Disease Management support and education needs related to Anxiety and Depression. DM, HTN, HF, asthma, OSA, arthritis, GERD, CAD, HLD, h/o breast cancer, RLS. 03/17/24:  Not checking blood sugar or BP right now-to f/u on new meter.   ECHO completed.  CARDS f/u in April.  Recovering from cold and feeling better.  RNCM Clinical Goal(s):  Patient will verbalize understanding of plan for management of Anxiety and Depression, DM, HTN  as evidenced by patient report take all medications exactly as prescribed and will call provider for medication related questions as evidenced by patient report demonstrate understanding of rationale for each prescribed medication as evidenced by patient report attend all scheduled medical appointments  as evidenced by patient report continue to work with RN Care Manager to address care management and care coordination needs related to  Anxiety and Depression, DM, HTN as evidenced by adherence to CM Team Scheduled appointments work with Child psychotherapist to address  related to the management of Mental Health Concerns  related to the management of Anxiety and Depression as evidenced by review of EMR and patient or Child psychotherapist report through collaboration with Medical illustrator, provider, and care team.   Interventions: Inter-disciplinary care team collaboration (see longitudinal plan of care) Evaluation of current treatment plan related to  self management and patient's adherence to plan as established by provider   (Status:  New goal.)  Long Term Goal Evaluation of current treatment plan related to Anxiety and Depression, Mental Health Concerns  self-management and patient's adherence to plan as established by provider. Discussed plans with patient for ongoing care management follow up and provided patient with direct contact information for care management team Advised patient to contact provider for any additional DME needs, CBG, and possible general surgeon referral. Reviewed medications with patient Collaborated with LCSW regarding anxiety/depression Reviewed scheduled/upcoming provider appointments Advised patient, providing education and rationale, to check cbg and record, calling provider  for  findings outside established parameters  Social Work referral for anxiety/depression Discussed plans with patient for ongoing care management follow up and provided patient with direct contact information for care management team Assessed social determinant of health barriers Collaborated with Lubertha Sayres Managed Medicaid Liaison regarding DME-motorized wheelchair and lift chair. LCSW appointment rescheduled. Collaborated with Pharmacist. Pharmacy referral for polypharmacy at patient's request-completed  Asthma: (Status:New goal.) Long Term Goal Advised patient to track and manage Asthma triggers Advised patient to engage in light exercise as tolerated 3-5 days a week to aid in the the management of Asthma Provided education about and advised patient to utilize infection prevention strategies to reduce risk of respiratory infection Discussed the importance of adequate rest and management of fatigue with Asthma Assessed social determinant of health barriers   CAD Interventions: (Status:  New goal.) Long Term Goal Assessed understanding of CAD diagnosis Medications reviewed including medications utilized in CAD treatment plan Provided education on importance of blood pressure control in management of CAD Provided education on Importance of limiting foods high in cholesterol Counseled on importance of regular laboratory monitoring as prescribed Counseled on the importance of exercise goals with target of 150 minutes per week Reviewed Importance of taking all medications as prescribed  Reviewed Importance of attending all scheduled provider appointments Advised to report any changes in symptoms or exercise tolerance Assessed social determinant of health barriers  Heart Failure Interventions:  (Status:  New goal.) Long Term Goal Basic overview and discussion of pathophysiology of Heart Failure reviewed Provided education on low sodium diet Assessed need for readable accurate scales  in home Provided education about placing scale on hard, flat surface Advised patient to weigh each morning after emptying bladder Discussed importance of daily weight and advised patient to weigh and record daily Discussed the importance of keeping all appointments with provider Provided patient with education about the role of exercise in the management of heart failure Assessed social determinant of health barriers   COPD Interventions:  (Status:  New goal.) Long Term Goal Advised patient to track and manage COPD triggers Advised patient to self assesses COPD action plan zone and make appointment with provider if in the yellow zone for 48 hours without improvement Advised patient to engage in light exercise as tolerated 3-5 days a week to aid in the the management of COPD Provided education about and advised patient to utilize infection prevention strategies to reduce risk of respiratory infection Discussed the importance of adequate rest and management of fatigue with COPD Assessed social determinant of health barriers  Hyperlipidemia Interventions:  (Status:  New goal.) Long Term Goal Medication review performed; medication list updated in electronic medical record.  Provider established cholesterol goals reviewed Counseled on importance of regular laboratory monitoring as prescribed Reviewed importance of limiting foods high in cholesterol Reviewed exercise goals and target of 150 minutes per week Assessed social determinant of health barriers    Diabetes Interventions:  (Status:  New goal.) Long Term Goal Assessed patient's understanding of A1c goal: <7% Reviewed medications with patient and discussed importance of medication adherence Reviewed scheduled/upcoming provider appointments  Advised patient, providing education and rationale, to check cbg and record, calling provider for findings outside established parameters Review of patient status, including review of consultants  reports, relevant laboratory and other test results, and medications completed Assessed social determinant of health barriers Lab Results  Component Value Date   HGBA1C 6.6 12/01/23   HGBA1C   7.0 on 08/18/23 HGBA1C  7.1 on 09/09/23  Hypertension Interventions:  (Status:  New goal.) Long Term Goal Last practice recorded BP readings:  BP Readings from Last 3 Encounters:       01/29/24          114/72 10/20/23         114/74 12/01/23         124/63  Most recent eGFR/CrCl:  Lab Results  Component Value Date   EGFR 90 06/04/2022    No components found for: "CRCL"  Evaluation of current treatment plan related to hypertension self management and patient's adherence to plan as established by provider Reviewed medications with patient and discussed importance of compliance Discussed plans with patient for ongoing care management follow up and provided patient with direct contact information for care management team Advised patient, providing education and rationale, to monitor blood pressure daily and record, calling PCP for findings outside established parameters Reviewed scheduled/upcoming provider appointments including:  Assessed social determinant of health barriers   Patient Goals/Self-Care Activities: Take all medications as prescribed Attend all scheduled provider appointments Call pharmacy for medication refills 3-7 days in advance of running out of medications Perform all self care activities independently  Perform IADL's (shopping, preparing meals, housekeeping, managing finances) independently Call provider office for new  concerns or questions  Work with the social worker to address care coordination needs and will continue to work with the clinical team to address health care and disease management related needs Patient to f/u with provider regarding sciatica pain-completed 03/17/24: patient to f/u with provider regarding new meter.  Follow Up Plan:  The patient has been  provided with contact information for the care management team and has been advised to call with any health related questions or concerns.  The care management team will [name] out to the patient again over the next 45 business  days.

## 2024-03-21 ENCOUNTER — Telehealth: Payer: Self-pay | Admitting: *Deleted

## 2024-03-21 NOTE — Telephone Encounter (Signed)
 Copied from CRM [id number]. Topic: Clinical - Prescription Issue >> Mar 18, 2024  2:47 PM Maree Krabbe H wrote: Reason for CRM: Patient would like to speak a nurse or provider to explain to her why she her dexcom is not approved when she is taking medicine for her diabetes. Patient callback number is [phone]

## 2024-03-21 NOTE — Telephone Encounter (Signed)
 RTC to patient informed her that her Dexcom had been denied as she is no longer on Insulin.  Patient states that she is on Wegovy and does not understand why they will not allow her to wear a monitor since it will affect her sugars.  Patient also said that  if the glucose is not a concern then she will refuse to have he glucose checks done when she comes inn as she does not like getting her fingers stuck.  Informed patient will forward to doctor to see if this will qualify.

## 2024-03-23 NOTE — Telephone Encounter (Signed)
 RTC to patient informed her that she is doing well controlling her sugars and that daily testing is not needed.  Patient voiced understanding of the need not to have a Dexcom  CGM.

## 2024-04-05 ENCOUNTER — Telehealth: Admitting: Physician Assistant

## 2024-04-05 DIAGNOSIS — R3989 Other symptoms and signs involving the genitourinary system: Secondary | ICD-10-CM

## 2024-04-05 MED ORDER — CEPHALEXIN 500 MG PO CAPS: 500.0000 mg | ORAL_CAPSULE | Freq: Two times a day (BID) | ORAL | 0 refills | Status: AC

## 2024-04-05 NOTE — Progress Notes (Signed)
 I have spent 5 minutes in review of e-visit questionnaire, review and updating patient chart, medical decision making and response to patient.   Piedad Climes, PA-C

## 2024-04-05 NOTE — Progress Notes (Signed)

## 2024-04-09 DIAGNOSIS — Z419 Encounter for procedure for purposes other than remedying health state, unspecified: Secondary | ICD-10-CM | POA: Diagnosis not present

## 2024-04-10 ENCOUNTER — Other Ambulatory Visit: Payer: Self-pay | Admitting: Student

## 2024-04-10 DIAGNOSIS — E1142 Type 2 diabetes mellitus with diabetic polyneuropathy: Secondary | ICD-10-CM

## 2024-04-10 DIAGNOSIS — E119 Type 2 diabetes mellitus without complications: Secondary | ICD-10-CM

## 2024-04-12 ENCOUNTER — Other Ambulatory Visit: Payer: Self-pay | Admitting: Student

## 2024-04-12 DIAGNOSIS — J441 Chronic obstructive pulmonary disease with (acute) exacerbation: Secondary | ICD-10-CM

## 2024-04-12 NOTE — Telephone Encounter (Signed)
 Medication sent to pharmacy

## 2024-04-20 ENCOUNTER — Ambulatory Visit: Attending: Physician Assistant | Admitting: Physician Assistant

## 2024-04-20 ENCOUNTER — Other Ambulatory Visit: Payer: Self-pay | Admitting: *Deleted

## 2024-04-20 ENCOUNTER — Ambulatory Visit: Payer: Self-pay

## 2024-04-20 ENCOUNTER — Encounter: Payer: Self-pay | Admitting: Physician Assistant

## 2024-04-20 VITALS — BP 126/74 | HR 86 | Ht 60.0 in | Wt 271.6 lb

## 2024-04-20 DIAGNOSIS — I342 Nonrheumatic mitral (valve) stenosis: Secondary | ICD-10-CM

## 2024-04-20 DIAGNOSIS — I2510 Atherosclerotic heart disease of native coronary artery without angina pectoris: Secondary | ICD-10-CM

## 2024-04-20 DIAGNOSIS — I350 Nonrheumatic aortic (valve) stenosis: Secondary | ICD-10-CM | POA: Diagnosis not present

## 2024-04-20 DIAGNOSIS — I050 Rheumatic mitral stenosis: Secondary | ICD-10-CM | POA: Insufficient documentation

## 2024-04-20 DIAGNOSIS — I10 Essential (primary) hypertension: Secondary | ICD-10-CM | POA: Diagnosis not present

## 2024-04-20 DIAGNOSIS — M5430 Sciatica, unspecified side: Secondary | ICD-10-CM | POA: Diagnosis not present

## 2024-04-20 DIAGNOSIS — I5032 Chronic diastolic (congestive) heart failure: Secondary | ICD-10-CM

## 2024-04-20 DIAGNOSIS — E785 Hyperlipidemia, unspecified: Secondary | ICD-10-CM

## 2024-04-20 NOTE — Assessment & Plan Note (Signed)
 Severe aortic stenosis status post-TAVR in December 2015. Echocardiogram in February 2025 showed EF 60-65%, mean TAVR gradient 17.2 mmHg, and moderate mitral stenosis.  - Continue SBE prophylaxis - Continue aspirin  81 mg daily - Continue Plavix  75 mg daily - Follow up in 6 months

## 2024-04-20 NOTE — Assessment & Plan Note (Signed)
 Significant weight loss of approximately 100 pounds over the last year and a half. I have congratulated her on this.

## 2024-04-20 NOTE — Patient Instructions (Signed)
 Medication Instructions:  Your physician recommends that you continue on your current medications as directed. Please refer to the Current Medication list given to you today.  *If you need a refill on your cardiac medications before your next appointment, please call your pharmacy*  Lab Work: AT YOUR CONVENIENCE, YOU CAN GO TO A [facility], MAKE SURE YOU ARE FASTING, FOR:  CMET & LIPID  If you have labs (blood work) drawn today and your tests are completely normal, you will receive your results only by: MyChart Message (if you have MyChart) OR A paper copy in the mail If you have any lab test that is abnormal or we need to change your treatment, we will call you to review the results.  Testing/Procedures: None ordered  Follow-Up: At [facility], you and your health needs are our priority.  As part of our continuing mission to provide you with exceptional heart care, our providers are all part of one team.  This team includes your primary Cardiologist (physician) and Advanced Practice Providers or APPs (Physician Assistants and Nurse Practitioners) who all work together to provide you with the care you need, when you need it.  Your next appointment:   6 month(s)  Provider:   Antoinette Batman, MD  or Marlyse Single, PA-C         We recommend signing up for the patient portal called "MyChart".  Sign up information is provided on this After Visit Summary.  MyChart is used to connect with patients for Virtual Visits (Telemedicine).  Patients are able to view lab/test results, encounter notes, upcoming appointments, etc.  Non-urgent messages can be sent to your provider as well.   To learn more about what you can do with MyChart, go to [email or web address].   Other Instructions       1st Floor: - Lobby - Registration  - Pharmacy  - Lab - Cafe  2nd Floor: - PV Lab - Diagnostic Testing (echo, CT, nuclear med)  3rd Floor: - Vacant  4th Floor: - TCTS (cardiothoracic  surgery) - AFib Clinic - Structural Heart Clinic - Vascular Surgery  - Vascular Ultrasound  5th Floor: - HeartCare Cardiology (general and EP) - Clinical Pharmacy for coumadin , hypertension, lipid, weight-loss medications, and med management appointments    Valet parking services will be available as well.

## 2024-04-20 NOTE — Assessment & Plan Note (Signed)
 Moderate mitral stenosis noted on echocardiogram in February 2025.   - Continue annual surveillance

## 2024-04-20 NOTE — Progress Notes (Signed)
 Cardiology Office Note:    Date:  04/20/2024  ID:  [name], DOB 1961/05/26, MRN [id number] PCP: Michelina Aho, Cynthea Drier, MD  [facility] Providers Cardiologist:  Antoinette Batman, MD       Patient Profile:      Severe aortic stenosis s/p TAVR in 11/2014 TTE 02/18/2023: EF 60-65, GR 1 DD, mild-moderate MS (mean gradient 8), s/p TAVR with mean gradient 11 TTE 02/23/24: EF 60-65, no RWMA, NL RVSF, mild LAE, mod MS (mean 7 mmHg), severe MAC, s/p TAVR  w mean 17.2 mmHg, Vmax 265 cm/s Mitral stenosis Coronary artery disease  S/p 2.75 mm DES to dRCA in 01/2009 LHC 09/14/2014: Ostial RCA 70-80, proximal RCA 40, distal RCA stent patent MPI 01/01/23: breast atten; no ischemia or infarction, EF 60; Low Risk Long term DAPT (HFpEF) heart failure with preserved ejection fraction  Hypertension  Hyperlipidemia  Diabetes mellitus  Hx of pulmonary embolism (provoked after TKR) in 01/2012 OSA Obesity  Asthma/Chronic Obstructive Pulmonary Disease GERD Former tobacco use Breast CA          Discussed the use of AI scribe software for clinical note transcription with the patient, who gave verbal consent to proceed.  History of Present Illness [name] is a 63 y.o. female who returns for follow-up of valvular heart disease, CAD.  She was last seen in January 2025 by Lovette Rud, PA-C.  She had lost about 80 pounds with GLP-1 agonist and increased physical activity.  She noted symptoms of dizziness and palpitations.  Follow-up echocardiogram demonstrated normal EF, moderate mitral stenosis, TAVR with mean gradient 17.2.  Her losartan /HCTZ dose was reduced given weight loss.  She is here alone. She has experienced significant weight loss of approximately 100 pounds over the past year and a half, attributed to medications including Wegovy  and Trulicity . Her blood pressure is well-controlled, typically around the 120s, and she feels better on a lower dose of her blood  pressure medications. She experiences occasional dizziness when standing, which has improved but still occurs sporadically. No chest discomfort is present, and she reports only slight shortness of breath, which improved with weight loss. She uses two pillows at night to aid breathing, a reduction from before her weight loss. There is slight swelling in her left leg, which has been a long-standing issue. No bleeding, black stools, or bloody urine are reported.   ROS-See HPI    Studies Reviewed:        Results    Risk Assessment/Calculations:             Physical Exam:   VS:  BP 126/74   Pulse 86   Ht 5' (1.524 m)   Wt 271 lb 9.6 oz (123.2 kg)   LMP 02/03/2012   SpO2 96%   BMI 53.04 kg/m    Wt Readings from Last 3 Encounters:  04/20/24 271 lb 9.6 oz (123.2 kg)  01/29/24 276 lb 6.4 oz (125.4 kg)  12/01/23 287 lb 4.8 oz (130.3 kg)    Constitutional:      Appearance: Healthy appearance. Not in distress.  Pulmonary:     Breath sounds: Normal breath sounds. No wheezing. No rales.  Cardiovascular:     Normal rate. Regular rhythm.     Murmurs: There is a grade 2/6 systolic murmur at the URSB.  Edema:    Peripheral edema absent.  Abdominal:     Palpations: Abdomen is soft.        Assessment and Plan:  Assessment & Plan Coronary artery disease involving native coronary artery of native heart without angina pectoris S/p DES to distal RCA in 2010. Cardiac catheterization in 2015 showed 70-80% stenosis in the ostial RCA with patent distal RCA stent. Nuclear perfusion study in January 2024 showed no ischemia or infarction. Currently asymptomatic without angina. - Continue aspirin  81 mg daily - Continue Plavix  75 mg daily - Continue Lipitor  80 mg daily - Continue losartan  HCTZ 50/12.5 mg twice daily - Continue metoprolol  succinate 50 mg daily Nonrheumatic aortic valve stenosis Severe aortic stenosis status post-TAVR in December 2015. Echocardiogram in February 2025 showed EF  60-65%, mean TAVR gradient 17.2 mmHg, and moderate mitral stenosis.  - Continue SBE prophylaxis - Continue aspirin  81 mg daily - Continue Plavix  75 mg daily - Follow up in 6 months Nonrheumatic mitral valve stenosis Moderate mitral stenosis noted on echocardiogram in February 2025.   - Continue annual surveillance Essential hypertension Hypertension is well-controlled. Reports feeling better on a lower dose of losartan  HCTZ. Occasional dizziness upon standing, but improved overall. Blood pressure readings at home are around 120 systolic. - Continue amlodipine  5 mg daily - Continue losartan  HCTZ 50/12.5 mg twice daily - Continue metoprolol  succinate 50 mg daily - Instruct to contact if dizziness worsens or blood pressure drops further for medication adjustment Hyperlipidemia LDL goal <70 Last cholesterol check was in December 2023.  - Arrange fasting lipid panel, CMET - Continue Lipitor  80 mg daily - Continue Zetia  10 mg daily - Continue Repatha  140 mg every two weeks Morbid obesity (HCC) Significant weight loss of approximately 100 pounds over the last year and a half. I have congratulated her on this. Sciatica, unspecified laterality She notes significant pain from this. If she needs an injection, she can proceed at acceptable risk and hold Clopidogrel  for 5 days before. Would continue ASA if possible.      Dispo:  Return in about 6 months (around 10/20/2024) for Routine Follow Up, w/ Dr. Abel Hoe.  Signed, Marlyse Single, PA-C

## 2024-04-20 NOTE — Assessment & Plan Note (Signed)
 S/p DES to distal RCA in 2010. Cardiac catheterization in 2015 showed 70-80% stenosis in the ostial RCA with patent distal RCA stent. Nuclear perfusion study in January 2024 showed no ischemia or infarction. Currently asymptomatic without angina. - Continue aspirin  81 mg daily - Continue Plavix  75 mg daily - Continue Lipitor  80 mg daily - Continue losartan  HCTZ 50/12.5 mg twice daily - Continue metoprolol  succinate 50 mg daily

## 2024-04-20 NOTE — Telephone Encounter (Signed)
 Pt has been schedule with her PCP tomorrow 4/24.

## 2024-04-20 NOTE — Telephone Encounter (Signed)
 Chief Complaint: Leg pain  Symptoms: Right hip & leg pain 8/10, numbness, coldness on the right leg Frequency: Constant Pertinent Negatives: Patient denies fever, chest pain, SOB Disposition: [] ED /[] Urgent Care (no appt availability in office) / [x] Appointment(In office/virtual)/ []  [facility]/ [] Home Care/ [] Refused Recommended Disposition /[] [facility]/ []  Follow-up with PCP Additional Notes: Patient states she has had leg pain that starts at the hip and radiates all the way down to the toes. Patient states the right leg is numb and cold at times. Care advice was given and patient has been scheduled for an appointment tomorrow morning with her PCP.  Copied from CRM [id number]. Topic: Clinical - Red Word Triage >> Apr 20, 2024  2:38 PM Retta Caster wrote: Red Word that prompted transfer to Nurse Triage: sciatic nerve/Getting worse numbness and coldness on Rt side Reason for Disposition  Numbness in a leg or foot (i.e., loss of sensation)  Answer Assessment - Initial Assessment Questions 1. ONSET: "When did the pain start?"      2-3 days  2. LOCATION: "Where is the pain located?"      Right hip down the thigh 3. PAIN: "How bad is the pain?"    (Scale 1-10; or mild, moderate, severe)   -  MILD (1-3): doesn't interfere with normal activities    -  MODERATE (4-7): interferes with normal activities (e.g., work or school) or awakens from sleep, limping    -  SEVERE (8-10): excruciating pain, unable to do any normal activities, unable to walk     8/10 4. WORK OR EXERCISE: "Has there been any recent work or exercise that involved this part of the body?"      No 5. CAUSE: "What do you think is causing the leg pain?"     Sciatic  6. OTHER SYMPTOMS: "Do you have any other symptoms?" (e.g., chest pain, back pain, breathing difficulty, swelling, rash, fever, numbness, weakness)     Numbness, coldness on the right leg 7. PREGNANCY: "Is there any chance you are pregnant?" "When was  your last menstrual period?"     N/A  Protocols used: Leg Pain-A-AH

## 2024-04-21 ENCOUNTER — Ambulatory Visit: Payer: Self-pay | Admitting: Student

## 2024-04-21 ENCOUNTER — Ambulatory Visit (HOSPITAL_COMMUNITY)
Admission: RE | Admit: 2024-04-21 | Discharge: 2024-04-21 | Disposition: A | Discharge status: Home / Self Care | Source: Ambulatory Visit | Attending: Internal Medicine | Admitting: Internal Medicine

## 2024-04-21 VITALS — BP 117/50 | HR 81 | Temp 98.6°F | Ht 60.0 in | Wt 273.2 lb

## 2024-04-21 DIAGNOSIS — E1142 Type 2 diabetes mellitus with diabetic polyneuropathy: Secondary | ICD-10-CM

## 2024-04-21 DIAGNOSIS — M1611 Unilateral primary osteoarthritis, right hip: Secondary | ICD-10-CM | POA: Diagnosis not present

## 2024-04-21 DIAGNOSIS — F411 Generalized anxiety disorder: Secondary | ICD-10-CM

## 2024-04-21 DIAGNOSIS — M25551 Pain in right hip: Secondary | ICD-10-CM | POA: Insufficient documentation

## 2024-04-21 DIAGNOSIS — F32A Depression, unspecified: Secondary | ICD-10-CM

## 2024-04-21 MED ORDER — GABAPENTIN 300 MG PO CAPS: ORAL_CAPSULE | ORAL | 0 refills | Status: DC

## 2024-04-21 MED ORDER — DULOXETINE HCL 30 MG PO CPEP: 30.0000 mg | ORAL_CAPSULE | Freq: Every day | ORAL | 3 refills | Status: DC

## 2024-04-21 NOTE — Patient Instructions (Signed)
 Thank you, [name] for allowing us  to provide your care today. Today we discussed your right hip pain.  I have ordered the following labs for you:  Lab Orders  No laboratory test(s) ordered today     Tests ordered today:  Right hip xray MRI lower back, pelvis  Referrals ordered today:   Referral Orders  No referral(s) requested today     I have ordered the following medication/changed the following medications:   Stop the following medications: Medications Discontinued During This Encounter  Medication Reason   DULoxetine  (CYMBALTA ) 20 MG capsule    gabapentin  (NEURONTIN ) 300 MG capsule Reorder     Start the following medications: Meds ordered this encounter  Medications   DULoxetine  (CYMBALTA ) 30 MG capsule    Sig: Take 1 capsule (30 mg total) by mouth daily.    Dispense:  30 capsule    Refill:  3   gabapentin  (NEURONTIN ) 300 MG capsule    Sig: TAKE 1 CAPSULE BY MOUTH IN THE MORNING, 1 IN THE AFTERNOON, AND 3 AT BEDTIME    Dispense:  150 capsule    Refill:  0     Follow up: 6 weeks or sooner as needed   Remember:   For pain:  - Cymbalta  has been increased from 20 mg to 30 mg. Remember that Cymbalta  and Trazodone  have similar ingredients, and taking them together can lead to a higher risk of serotonin syndrome (see handout). If you experience the following please call 911 or go to the emergency department as you may be experiencing a medical emergency:  Sweating. Restlessness or agitation. Muscle twitching or stiffness. Rapid heart rate. Nausea, vomiting, or diarrhea. Shivering or goose bumps. Confusion. - You can add an afternoon dose of gabapentin  300 mg, so 300 in the morning, 300 in the afternoon, and 900 in the evening. Please be cautious of symptoms that may increase your risk of falls.  Imaging:  - You will get a right hip xray today - We have ordered a lumbar spine and pelvis mri to help determine next steps - I will call you with  results  Please return in 6 weeks or sooner as needed for further management of your right high pain.   Should you have any questions or concerns please call the internal medicine clinic at [phone].     Deveion Denz Arellano Zameza, MD PGY-1 Internal Medicine Teaching Progam [facility]

## 2024-04-25 DIAGNOSIS — M25551 Pain in right hip: Secondary | ICD-10-CM | POA: Insufficient documentation

## 2024-04-25 NOTE — Progress Notes (Signed)
 Acute Office Visit  Subjective:     Patient ID: [name], female    DOB: 1961-05-13, 63 y.o.   MRN: [id number]  Chief Complaint  Patient presents with   Sciatica    C/o right side nerve pain "getting worse"     Patient is a 63 y.o. with a past medical history stated below who presents today for acute right hip pain. Please see problem based assessment and plan for additional details.     Review of Systems  Constitutional:  Negative for fever and malaise/fatigue.  Cardiovascular:  Negative for chest pain and palpitations.  Genitourinary:        Episodes of fecal and urinary incontinence       Objective:    BP (!) 117/50 (BP Location: Left Arm, Patient Position: Sitting, Cuff Size: Large)   Pulse 81   Temp 98.6 F (37 C) (Oral)   Ht 5' (1.524 m)   Wt 273 lb 3.2 oz (123.9 kg)   LMP 02/03/2012   SpO2 95%   BMI 53.36 kg/m  BP Readings from Last 3 Encounters:  04/21/24 (!) 117/50  04/20/24 126/74  01/29/24 114/72   Wt Readings from Last 3 Encounters:  04/21/24 273 lb 3.2 oz (123.9 kg)  04/20/24 271 lb 9.6 oz (123.2 kg)  01/29/24 276 lb 6.4 oz (125.4 kg)   Physical Exam HENT:     Head: Normocephalic and atraumatic.  Pulmonary:     Effort: Pulmonary effort is normal.  Abdominal:     Palpations: Abdomen is soft.  Musculoskeletal:     Comments: Physical exam limited by pain and mobility. Patient was able to participate while sitting down in motorized chair, was also observed standing and walking uncomfortably a short distance to the bathroom. Strength slightly decreased on right leg compared to left, tender to palpation along right hip joint, some tenderness along the lower back to the right of midline with deep palpation. Sensation intact bilaterally, pedal pulses present bilaterally. Skin on RLE seemed to be similar in temperature to LLE.   Neurological:     Mental Status: She is alert.    No results found for any visits on 04/21/24.     Assessment &  Plan:   Problem List Items Addressed This Visit     GAD (generalized anxiety disorder)   Relevant Medications   DULoxetine  (CYMBALTA ) 30 MG capsule   Diabetic neuropathy (HCC)   Relevant Medications   gabapentin  (NEURONTIN ) 300 MG capsule   Acute hip pain, right - Primary   Patient is here for evaluation of right hip pain that radiates down the leg intermittently. Has history of hip pain bilaterally for past 2 years but states the right side seems to have acutely worsened over the last week. Denies falls, injury, changes in medication, fever, or recent illness. Describes the pain as sharp and unresponsive to Tylenol  and ibuprofen . Also taking gabapentin  and duloxetine  for neuropathic pain but patient does not feel like current dosing is helping to manage her hip pain. Feels like her hip joint is 'catching' and is making it challenging for her to get up out of her chair or from a sitting position without additional help (which she wasn't requiring before). Concerned that her right leg feels colder than the left. Denies any numbness on the outside or inner thigh. Has a history of urinary incontinence due to pelvic floor weakness following pregnancy but reports new episodes of fecal incontinence that seemed to begin 3-4 weeks ago. Patient  does not seem to be in any acute distress on physical exam. Did demonstrate 4/5 strength on right lower extremity compared to the left. Temperature and sensation similar on both sides. Did endorse some tenderness on deep palpation of the right hip joint and lower back (right of midline).   Discussed importance of urgent imaging given the acute nature, lack of response to pain medication, and new episodes of fecal incontinence. Patient agreeable to increasing doses of current neuropathic pain medications to see if that helps with symptoms. Right hip xray negative for fracture or acute injury.  Plan:  - For pain: increase Cymbalta  from 20 mg to 30 mg. Reviewed possible  interactions between medications like Cymbalta  and Trazodone  and risk of serotonin syndrome. Also discussed adding afternoon dose of gabapentin  300 mg for a total of 300 in the morning, 300 in the afternoon, and 900 in the evening. Reviewed increased risk of sedation/falls, patient acknowledged understanding.  - Order for urgent MRI of lumbar spine and pelvis placed, will follow up as needed - Return precautions provided - Will follow up patient management and next steps including any necessary referrals in 6 weeks or sooner as neeed      Relevant Orders   DG HIP UNILAT WITH PELVIS 2-3 VIEWS RIGHT (Completed)   MR Lumbar Spine Wo Contrast   MR Pelvis W Wo Contrast   Other Visit Diagnoses       Mild depression       Relevant Medications   DULoxetine  (CYMBALTA ) 30 MG capsule      Meds ordered this encounter  Medications   DULoxetine  (CYMBALTA ) 30 MG capsule    Sig: Take 1 capsule (30 mg total) by mouth daily.    Dispense:  30 capsule    Refill:  3   gabapentin  (NEURONTIN ) 300 MG capsule    Sig: TAKE 1 CAPSULE BY MOUTH IN THE MORNING, 1 IN THE AFTERNOON, AND 3 AT BEDTIME    Dispense:  150 capsule    Refill:  0   Patient discussed with Dr. Ancil Balzarine.  Return 6 weeks or sooner as needed.  [name] Spielmann Arellano Zameza, MD PGY-1, [facility]

## 2024-04-25 NOTE — Progress Notes (Signed)
 Internal Medicine Clinic Attending  Case discussed with the resident at the time of the visit.  We reviewed the resident's history and exam and pertinent patient test results.  I agree with the assessment, diagnosis, and plan of care documented in the resident's note.

## 2024-04-25 NOTE — Assessment & Plan Note (Signed)
 Patient is here for evaluation of right hip pain that radiates down the leg intermittently. Has history of hip pain bilaterally for past 2 years but states the right side seems to have acutely worsened over the last week. Denies falls, injury, changes in medication, fever, or recent illness. Describes the pain as sharp and unresponsive to Tylenol  and ibuprofen . Also taking gabapentin  and duloxetine  for neuropathic pain but patient does not feel like current dosing is helping to manage her hip pain. Feels like her hip joint is 'catching' and is making it challenging for her to get up out of her chair or from a sitting position without additional help (which she wasn't requiring before). Concerned that her right leg feels colder than the left. Denies any numbness on the outside or inner thigh. Has a history of urinary incontinence due to pelvic floor weakness following pregnancy but reports new episodes of fecal incontinence that seemed to begin 3-4 weeks ago. Patient does not seem to be in any acute distress on physical exam. Did demonstrate 4/5 strength on right lower extremity compared to the left. Temperature and sensation similar on both sides. Did endorse some tenderness on deep palpation of the right hip joint and lower back (right of midline).   Discussed importance of urgent imaging given the acute nature, lack of response to pain medication, and new episodes of fecal incontinence. Patient agreeable to increasing doses of current neuropathic pain medications to see if that helps with symptoms. Right hip xray negative for fracture or acute injury.  Plan:  - For pain: increase Cymbalta  from 20 mg to 30 mg. Reviewed possible interactions between medications like Cymbalta  and Trazodone  and risk of serotonin syndrome. Also discussed adding afternoon dose of gabapentin  300 mg for a total of 300 in the morning, 300 in the afternoon, and 900 in the evening. Reviewed increased risk of sedation/falls, patient  acknowledged understanding.  - Order for urgent MRI of lumbar spine and pelvis placed, will follow up as needed - Return precautions provided - Will follow up patient management and next steps including any necessary referrals in 6 weeks or sooner as neeed

## 2024-04-27 ENCOUNTER — Ambulatory Visit: Payer: Medicaid Other | Admitting: Physician Assistant

## 2024-04-28 ENCOUNTER — Telehealth: Payer: Self-pay | Admitting: *Deleted

## 2024-04-29 ENCOUNTER — Ambulatory Visit: Payer: Self-pay

## 2024-04-29 NOTE — Patient Outreach (Signed)
 Complex Care Management   Visit Note  04/29/2024  Name:  [name] MRN: [id number] DOB: 1961-01-10  Situation: Referral received for Complex Care Management related to  Right Hip Pain  I obtained verbal consent from Patient.  Visit completed with patient  on the phone  Background:   Past Medical History:  Diagnosis Date   Anemia    Anxiety    Aortic valve stenosis, severe    Arthritis    PAIN AND SEVERE OA LEFT KNEE ; S/P RIGHT TKA ON 02/03/12; HAS LOWER BACK PAIN-UNABLE TO STAND MORE THAN 10 MIN; ARTHRITIS "ALL OVER"   Asthma    Blood transfusion    [facility]   Breast cancer in female [facility])    Right   CAD (coronary artery disease)    Cath 2010 with DES x 1 RCA-- PT'S CARDIOLOGIST IS DR. MCALHANY   Chronic diastolic congestive heart failure (HCC)    COPD (chronic obstructive pulmonary disease) (HCC)    Mild   COPD exacerbation (HCC) 11/16/2022   Depression    Diabetes mellitus DIAGNOSED IN2010   Dyspnea    with much ambulation   Eczema    on back   Headache    migraines younger- rare now 02/07/21   Heart murmur    no current problems   History of hiatal hernia    History of kidney stones    passed or blasted   History of UTI 12/05/2022   Hyperlipidemia    Hypertension    Morbid obesity with body mass index of 60.0-69.9 in adult [facility])    Myocardial infarction (HCC)    PT THINKS SHE WAS DX WITH MI AT THE TIME OF HEART STENTING   Neuromuscular disorder (HCC)    bilateral arm/hands   Oxygen dependent    uses 3L oxygen night/prn   Personal history of radiation therapy    Pneumonia    Pulmonary embolism (HCC) 02/08/2012   S/P RT TOTAL KNEE ON 02/03/12--ON 02/08/12--DEVELOPED ACUTE SOB AND CHEST PAIN--AND DIAGNOSED WITH  PULMONARY EMBOLUS AND PNEUMONIA   Restless leg syndrome    Sleep apnea    Uterine fibroid    NO PROBLEMS AT PRESENT FROM THE FIBROIDS-STATES SHE IS POST MENOPAUSAL-LAST MENSES 2010 EXCEPT FOR EPISODE THIS YR OF BLEEDING RELATED TO FIBROIDS.    Weakness    BOTH HANDS - S/P BILATERAL CARPAL TUNNEL RELEASE--BUT STILL HAS WEAKNESS--OFTEN DROPS THINGS    Assessment: Patient Reported Symptoms:  Cognitive Cognitive Status: Able to follow simple commands, Alert and oriented to person, place, and time, Insightful and able to interpret abstract concepts Cognitive/Intellectual Conditions Management [RPT]: None reported or documented in medical history or problem list   Health Maintenance Behaviors: Healthy diet, Sleep adequate Healing Pattern: Slow  Neurological Neurological Review of Symptoms: No symptoms reported    HEENT HEENT Symptoms Reported: No symptoms reported      Cardiovascular Cardiovascular Symptoms Reported: Swelling in legs or feet Does patient have uncontrolled Hypertension?: No Cardiovascular Conditions: Heart failure Cardiovascular Management Strategies: Medication therapy  Respiratory Respiratory Symptoms Reported: Chest tightness, Shortness of breath Additional Respiratory Details: with exertion Respiratory Conditions: COPD  Endocrine Is patient diabetic?: Yes Is patient checking blood sugars at home?: No Endocrine Conditions: Diabetes  Gastrointestinal Gastrointestinal Symptoms Reported: Constipation, Diarrhea Gastrointestinal Conditions: Constipation, Diarrhea Gastrointestinal Management Strategies: Medication therapy, Fluid modification Gastrointestinal Self-Management Outcome: 4 (good)    Genitourinary Genitourinary Symptoms Reported: Incontinence Genitourinary Management Strategies: Incontinence garment/pad Genitourinary Self-Management Outcome: 4 (good)  Integumentary Integumentary Symptoms Reported:  Other (Dry skin)    Musculoskeletal Musculoskelatal Symptoms Reviewed: Difficulty walking Musculoskeletal Conditions: Back pain, Other Other Musculoskeletal Conditions: hip pain Musculoskeletal Management Strategies: Medical device, Medication therapy Falls in the past year?: No    Psychosocial  Psychosocial Symptoms Reported: No symptoms reported     Quality of Family Relationships: supportive Do you feel physically threatened by others?: No      04/29/2024    9:42 AM  Depression screen PHQ 2/9  Decreased Interest 0  Down, Depressed, Hopeless 1  PHQ - 2 Score 1    There were no vitals filed for this visit.  Medications Reviewed Today     Reviewed by Augustin Leber, RN (Registered Nurse) on 04/29/24 at 0919  Med List Status: <None>   Medication Order Taking? Sig Documenting Provider Last Dose Status Informant  acetaminophen  (TYLENOL ) 500 MG tablet [id number] Yes Take 1,000 mg by mouth every 6 (six) hours as needed for moderate pain. [provider] Taking Active Self  amLODipine  (NORVASC ) 5 MG tablet [id number]  Take 1 tablet (5 mg total) by mouth daily. Toy Freund, MD  Expired 04/20/24 2359 Self  aspirin  EC 81 MG tablet [id number] Yes Take 1 tablet (81 mg total) by mouth daily. Swallow whole. Magnant, Justice Olp, PA-C Taking Active   atorvastatin  (LIPITOR ) 80 MG tablet [id number] Yes Take 1 tablet (80 mg total) by mouth daily. Odie Benne, MD Taking Active   Blood Pressure Monitoring (BLOOD PRESSURE CUFF) MISC [id number] Yes Please follow insert instructions. Arellano Zameza, Priscila, MD Taking Active Self  busPIRone  (BUSPAR ) 15 MG tablet [id number] Yes Take 1 tablet (15 mg total) by mouth 2 (two) times daily. Arlyne Bering, NP Taking Active   clopidogrel  (PLAVIX ) 75 MG tablet [id number] Yes Take 1 tablet (75 mg total) by mouth every morning. Odie Benne, MD Taking Active   DULoxetine  (CYMBALTA ) 30 MG capsule [id number] Yes Take 1 capsule (30 mg total) by mouth daily. Arellano Zameza, Priscila, MD Taking Active   Evolocumab  (REPATHA  SURECLICK) 140 MG/ML [place] [id number] Yes Inject 140 mg into the skin every 14 (fourteen) days. Lasandra Points, MD Taking Active Self  ezetimibe  (ZETIA ) 10 MG tablet [id number] Yes Take 1 tablet by mouth once  daily Odie Benne, MD Taking Active     Discontinued 05/09/20 1429   Fluticasone -Umeclidin-Vilant (TRELEGY ELLIPTA ) 100-62.5-25 MCG/ACT AEPB [id number] Yes Inhale 1 puff into the lungs daily. [provider] Taking Active   gabapentin  (NEURONTIN ) 300 MG capsule [id number] Yes TAKE 1 CAPSULE BY MOUTH IN THE MORNING, 1 IN THE AFTERNOON, AND 3 AT BEDTIME Arellano Zameza, Priscila, MD Taking Active            Med Note Arleta Lade, Laymon Priest   Fri Apr 29, 2024  9:18 AM) Takes 2 pills iin the morning 2 in the afternoon and to at night  hydrocortisone  cream 1 % [id number] Yes Apply 1 Application topically daily as needed for itching. [provider] Taking Active Self  losartan -hydrochlorothiazide  (HYZAAR) 50-12.5 MG tablet [id number] Yes Take 1 tablet by mouth daily. Odie Benne, MD Taking Active   metFORMIN  (GLUCOPHAGE -XR) 500 MG 24 hr tablet [id number] Yes Take 1 tablet by mouth twice daily Arellano Zameza, Priscila, MD Taking Active   metoprolol  succinate (TOPROL -XL) 50 MG 24 hr tablet [id number] Yes Take 1 tablet (50 mg total) by mouth daily. Take with or immediately following a meal.TAKE 1 TABLET BY MOUTH ONCE DAILY WITH MEALS OR  IMMEDIATELY  FOLLOWING  A  MEAL Arellano Zameza,  Priscila, MD Taking Active   montelukast  (SINGULAIR ) 10 MG tablet [id number] Yes Take 1 tablet (10 mg total) by mouth at bedtime. Arellano Zameza, Priscila, MD Taking Active   Semaglutide -Weight Management (WEGOVY ) 2.4 MG/0.75ML SOAJ [id number] Yes INJECT 2.4 MG INTO THE SKIN ONCE A WEEK Atway, Rayann N, DO Taking Active   traZODone  (DESYREL ) 150 MG tablet [id number] Yes Take 1 tablet (150 mg total) by mouth at bedtime. Arlyne Bering, NP Taking Active   VENTOLIN  HFA 108 (90 Base) MCG/ACT inhaler [id number] Yes Inhale 2 puffs into the lungs every 6 (six) hours as needed for wheezing or shortness of breath. Aurora Lees, DO Taking Active Self            Recommendation:   PCP Follow-up Make  sure to get your date for the MRI and attend  Follow Up Plan:   Telephone follow up appointment date/time:  05/27/24  245 pm  Augustin Leber RN, BSN, [facility]  [facility], [facility]  Care Coordinator Phone: [phone]

## 2024-04-29 NOTE — Telephone Encounter (Signed)
 Imaging is currently pending and awaiting Approval from ins   Copied from CRM [id number]. Topic: Clinical - Request for Lab/Test Order >> Apr 28, 2024 12:00 PM Tisa Forester wrote: Reason for CRM: patient provider want  patient to get an MRI on her right hip  and patient checking on the status  Has question if the MRI place going to call patient or do patient have to call , need general information  Patient call back number [phone]

## 2024-04-29 NOTE — Patient Instructions (Signed)
 Visit Information  Ms. Peed was given information about Medicaid Managed Care team care coordination services as a part of their [facility] Medicaid benefit. Marvis Sluder verbally consented to engagement with the [facility] Managed Care team.   If you are experiencing a medical emergency, please call 911 or report to your local emergency department or urgent care.   If you have a non-emergency medical problem during routine business hours, please contact your provider's office and ask to speak with a nurse.   For questions related to your [facility] health plan, please call: [phone] or go here:[email or web address]  If you would like to schedule transportation through your [facility] plan, please call the following number at least 2 days in advance of your appointment: [phone].   You can also use the MTM portal or MTM mobile app to manage your rides. Reimbursement for transportation is available through [facility]! For the portal, please go to [email or web address]  Call the [facility] Crisis Line at [phone], at any time, 24 hours a day, 7 days a week. If you are in danger or need immediate medical attention call 911.  If you would like help to quit smoking, call 1-800-QUIT-NOW (516-696-2992) OR Espaol: 1-855-Djelo-Ya (6-644-034-7425) o para ms informacin haga clic aqu or Text READY to 956-387 to register via text  [name] - following are the goals we discussed in your visit today:   Goals Addressed             This Visit's Progress    VBCI RN Care Plan       Problems:  Chronic Disease Management support and education needs related to Right Hip Pain  Goal: Over the next 30 days the Patient will attend all scheduled medical appointments: with PCP and specialist as evidenced by keeping al scheduled appointments        demonstrate Ongoing adherence to prescribed treatment plan for Right Hip Pain as evidenced by no admissions to the  hospital verbalize basic understanding of Right Hip Pain disease process and self health management plan as evidenced by verbal explanation lifestyle changes and consistent medication compliance   Interventions:   Pain Interventions: Pain assessment performed Medications reviewed Reviewed provider established plan for pain management Discussed importance of adherence to all scheduled medical appointments Counseled on the importance of reporting any/all new or changed pain symptoms or management strategies to pain management provider Advised patient to report to care team affect of pain on daily activities Discussed use of relaxation techniques and/or diversional activities to assist with pain reduction (distraction, imagery, relaxation, massage, acupressure, TENS, heat, and cold application Reviewed with patient prescribed pharmacological and nonpharmacological pain relief strategies Screening for signs and symptoms of depression related to chronic disease state  Assessed social determinant of health barriers  Patient Self-Care Activities:  Attend all scheduled provider appointments Call pharmacy for medication refills 3-7 days in advance of running out of medications Call provider office for new concerns or questions  Perform all self care activities independently  Perform IADL's (shopping, preparing meals, housekeeping, managing finances) independently Take medications as prescribed    Plan:  Telephone follow up appointment with care management team member scheduled for:  04/30/24  245 pm             Please see education materials related to Right Hip Pain provided by MyChart link.  Patient verbalizes understanding of instructions and care plan provided today and agrees to view in MyChart. Active MyChart status and patient understanding of how to access  instructions and care plan via MyChart confirmed with patient.     Telephone follow up appointment date/time:  05/27/24  245  pm  Augustin Leber RN, BSN, [facility]  [facility], [facility] Health  Care Coordinator Phone: [phone]      Following is a copy of your plan of care:  There are no care plans that you recently modified to display for this patient.

## 2024-05-05 NOTE — Telephone Encounter (Unsigned)
 Copied from CRM [id number]. Topic: Clinical - Medication Refill >> May 05, 2024 10:45 AM Latavia C wrote: Medication: VENTOLIN  HFA 108 (90 Base) MCG/ACT inhaler  Has the patient contacted their pharmacy? Yes (Agent: If no, request that the patient contact the pharmacy for the refill. If patient does not wish to contact the pharmacy document the reason why and proceed with request.) (Agent: If yes, when and what did the pharmacy advise?)  This is the patient's preferred pharmacy:  [facility] [address], [place] - [address] [place] [address] Phone: [phone] Fax: [phone]  Is this the correct pharmacy for this prescription? Yes If no, delete pharmacy and type the correct one.   Has the prescription been filled recently? No  Is the patient out of the medication? Yes  Has the patient been seen for an appointment in the last year OR does the patient have an upcoming appointment? Yes  Can we respond through MyChart? Yes  Agent: Please be advised that Rx refills may take up to 3 business days. We ask that you follow-up with your pharmacy.

## 2024-05-09 DIAGNOSIS — Z419 Encounter for procedure for purposes other than remedying health state, unspecified: Secondary | ICD-10-CM | POA: Diagnosis not present

## 2024-05-10 ENCOUNTER — Telehealth: Payer: Self-pay | Admitting: Student

## 2024-05-10 NOTE — Telephone Encounter (Signed)
 Spoke with the patient she is aware her case is still pending with no Authorization.  Advised patient to possibly reach out to her ins to see why it is still pending per notes below:   PM Status: Other/Pending Entry Method: RadMD Validity Dates: [Not Applicable] ICD10: M25.551  Contact Initial Determination Date: NOT COMPLETED Email:  Final Determination Date: NOT COMPLETED    Please be advised that all data was current as of Tuesday, May 10, 2024 at 10:28 AM MST Radiology Date of Service: Not Available   Update Expedited: No Extension: No CPT4: [id number] Billable Codes Clinical Rcvd: 2024-05-08 - Clinical information received via fax or upload  Copied from CRM #[id number]. Topic: Referral - Status >> May 10, 2024 12:18 PM Carrielelia G wrote: Patient calling in regards to her MRI referral  Please advise

## 2024-05-10 NOTE — Telephone Encounter (Signed)
 Copied from CRM [id number]. Topic: Clinical - Medication Refill >> May 10, 2024 12:13 PM Carrielelia G wrote: Medication: VENTOLIN  HFA 108 (90 Base) MCG/ACT inhaler  Has the patient contacted their pharmacy? Yes (Agent: If no, request that the patient contact the pharmacy for the refill. If patient does not wish to contact the pharmacy document the reason why and proceed with request.) (Agent: If yes, when and what did the pharmacy advise?)  This is the patient's preferred pharmacy:  [facility] [address] [place] [address], [place] - [address] [place] [address] Phone: [phone] Fax: [phone]  Is this the correct pharmacy for this prescription? Yes If no, delete pharmacy and type the correct one.   Has the prescription been filled recently? no  Is the patient out of the medication? Yes  Has the patient been seen for an appointment in the last year OR does the patient have an upcoming appointment? Yes  Can we respond through MyChart? Yes  Agent: Please be advised that Rx refills may take up to 3 business days. We ask that you follow-up with your pharmacy.

## 2024-05-11 ENCOUNTER — Encounter (HOSPITAL_COMMUNITY): Payer: Self-pay | Admitting: Psychiatry

## 2024-05-11 ENCOUNTER — Telehealth (HOSPITAL_COMMUNITY): Payer: Medicaid Other | Admitting: Psychiatry

## 2024-05-11 DIAGNOSIS — F4310 Post-traumatic stress disorder, unspecified: Secondary | ICD-10-CM | POA: Diagnosis not present

## 2024-05-11 DIAGNOSIS — F411 Generalized anxiety disorder: Secondary | ICD-10-CM | POA: Diagnosis not present

## 2024-05-11 DIAGNOSIS — F32A Depression, unspecified: Secondary | ICD-10-CM | POA: Diagnosis not present

## 2024-05-11 MED ORDER — HYDROXYZINE HCL 50 MG PO TABS: 50.0000 mg | ORAL_TABLET | Freq: Every evening | ORAL | 3 refills | Status: DC | PRN

## 2024-05-11 MED ORDER — TRAZODONE HCL 150 MG PO TABS: 150.0000 mg | ORAL_TABLET | Freq: Every day | ORAL | 3 refills | Status: DC

## 2024-05-11 MED ORDER — BUSPIRONE HCL 15 MG PO TABS: 15.0000 mg | ORAL_TABLET | Freq: Two times a day (BID) | ORAL | 3 refills | Status: DC

## 2024-05-11 MED ORDER — DULOXETINE HCL 30 MG PO CPEP: 30.0000 mg | ORAL_CAPSULE | Freq: Every day | ORAL | 3 refills | Status: DC

## 2024-05-11 NOTE — Progress Notes (Signed)
 BH MD/PA/NP OP Progress Note Virtual Visit via Video Note  I connected with Marvis Sluder on 05/11/24 at  1:00 PM EDT by a video enabled telemedicine application and verified that I am speaking with the correct person using two identifiers.  Location: Patient: Home Provider: Clinic   I discussed the limitations of evaluation and management by telemedicine and the availability of in person appointments. The patient expressed understanding and agreed to proceed.  I provided 30 minutes of non-face-to-face time during this encounter.   05/11/2024 1:43 PM [name]  MRN:  [id number]  Chief Complaint: "The trazodone  is working but not good"  HPI:  63 year old female seen today for follow-up psychiatric evaluation.  She has a psychiatric history of anxiety, depression, and PTSD.  Currently she is being managed on BuSpar  15 mg 3 times daily (noting that she takes it twice daily), Cymbalta  30 mg daily, and trazodone  150 mg nightly as needed.  She notes the medications are somehat effective in managing her psychiatric condition.   Today she was well-groomed, pleasant, cooperative, and engaged in conversation.  She informed Clinical research associate that trazodone  works but not the best. She continues to sleep 4-5 hours nightly. Patient notes that at times she has becomes a little anxious. She notes that her new roommates argue which makes her feel on edge. She notes that they moved in 2 months ago. Patient also notes that that they have 6 dogs in their home which becomes overwhelming.  Despite the above patient notes that her mood, anxiety, and depression are well-managed. Today provider conducted a GAD 7 and patient scored a 6, at her last visit she scored a 6. Provider also conducted a PHQ 9 and patient scored a 4, at her last visit she scored a 6. She endorses adequate appetite.  She reports that she is lost 5 more pounds with the assistance of Wegovy .  Patient reports that she continues to have right  hip pain.  She notes that she is scheduled for MRI soon.  She notes that Cymbalta  and Tylenol  are somewhat effective for managing her pain but she quantifies today as a 5 out of 10.  Today patient is agreeable to trialing hydroxyzine 50 mg at night to help manage sleep.  She will continue her other medications as prescribed.  Patient's Cymbalta  was increased by her PCP from 20 mg to 30 mg.  Provider will continue her at this current dose. No other concerns noted at this time.     Visit Diagnosis:    ICD-10-CM   1. GAD (generalized anxiety disorder)  F41.1 DULoxetine  (CYMBALTA ) 30 MG capsule    traZODone  (DESYREL ) 150 MG tablet    hydrOXYzine (ATARAX) 50 MG tablet    busPIRone  (BUSPAR ) 15 MG tablet    2. PTSD (post-traumatic stress disorder)  F43.10 DULoxetine  (CYMBALTA ) 30 MG capsule    traZODone  (DESYREL ) 150 MG tablet    busPIRone  (BUSPAR ) 15 MG tablet    3. Mild depression  F32.A DULoxetine  (CYMBALTA ) 30 MG capsule    traZODone  (DESYREL ) 150 MG tablet    hydrOXYzine (ATARAX) 50 MG tablet    busPIRone  (BUSPAR ) 15 MG tablet          Past Psychiatric History: anxiety, depression, and PTSD  Past Medical History:  Past Medical History:  Diagnosis Date   Anemia    Anxiety    Aortic valve stenosis, severe    Arthritis    PAIN AND SEVERE OA LEFT KNEE ; S/P RIGHT TKA ON  02/03/12; HAS LOWER BACK PAIN-UNABLE TO STAND MORE THAN 10 MIN; ARTHRITIS "ALL OVER"   Asthma    Blood transfusion    2013[facility]   Breast cancer in female [facility])    Right   CAD (coronary artery disease)    Cath 2010 with DES x 1 RCA-- PT'S CARDIOLOGIST IS DR. MCALHANY   Chronic diastolic congestive heart failure (HCC)    COPD (chronic obstructive pulmonary disease) (HCC)    Mild   COPD exacerbation (HCC) 11/16/2022   Depression    Diabetes mellitus DIAGNOSED IN2010   Dyspnea    with much ambulation   Eczema    on back   Headache    migraines younger- rare now 02/07/21   Heart murmur    no current problems    History of hiatal hernia    History of kidney stones    passed or blasted   History of UTI 12/05/2022   Hyperlipidemia    Hypertension    Morbid obesity with body mass index of 60.0-69.9 in adult [facility])    Myocardial infarction (HCC)    PT THINKS SHE WAS DX WITH MI AT THE TIME OF HEART STENTING   Neuromuscular disorder (HCC)    bilateral arm/hands   Oxygen dependent    uses 3L oxygen night/prn   Personal history of radiation therapy    Pneumonia    Pulmonary embolism (HCC) 02/08/2012   S/P RT TOTAL KNEE ON 02/03/12--ON 02/08/12--DEVELOPED ACUTE SOB AND CHEST PAIN--AND DIAGNOSED WITH  PULMONARY EMBOLUS AND PNEUMONIA   Restless leg syndrome    Sleep apnea    Uterine fibroid    NO PROBLEMS AT PRESENT FROM THE FIBROIDS-STATES SHE IS POST MENOPAUSAL-LAST MENSES 2010 EXCEPT FOR EPISODE THIS YR OF BLEEDING RELATED TO FIBROIDS.   Weakness    BOTH HANDS - S/P BILATERAL CARPAL TUNNEL RELEASE--BUT STILL HAS WEAKNESS--OFTEN DROPS THINGS    Past Surgical History:  Procedure Laterality Date   BACK SURGERY  02/11/2021   Dr Richardo Chandler   BICEPT TENODESIS Right 09/15/2023   Procedure: BICEPS TENODESIS;  Surgeon: Jasmine Mesi, MD;  Location: [facility] OR;  Service: Orthopedics;  Laterality: Right;   BREAST BIOPSY Right 06/04/2018   BREAST LUMPECTOMY Right 06/2018   BREAST LUMPECTOMY WITH RADIOACTIVE SEED AND SENTINEL LYMPH NODE BIOPSY Right 07/19/2018   Procedure: BREAST LUMPECTOMY WITH RADIOACTIVE SEED AND SENTINEL LYMPH NODE BIOPSY;  Surgeon: Juanita Norlander, MD;  Location[facility] OR;  Service: General;  Laterality: Right;   CARPAL TUNNEL RELEASE     Bilateral   CHOLECYSTECTOMY     COLONOSCOPY  2010   CORONARY ANGIOPLASTY     2010 has stent in place   CYSTOSCOPY W/ RETROGRADES Right 09/21/2013   Procedure: CYSTOSCOPY WITH RIGHT RETROGRADE PYELOGRAM RIGHT DOUBLE J STENT ;  Surgeon: Moise Anes, MD;  Location: [facility] ORS;  Service: Urology;  Laterality: Right;   CYSTOSCOPY W/ URETERAL STENT  PLACEMENT Right 11/16/2022   Procedure: CYSTOSCOPY WITH RETROGRADE PYELOGRAM/URETERAL STENT PLACEMENT;  Surgeon: Mallie Seal, MD;  Location: [facility] OR;  Service: Urology;  Laterality: Right;   CYSTOSCOPY WITH RETROGRADE PYELOGRAM, URETEROSCOPY AND STENT PLACEMENT Right 01/13/2023   Procedure: CYSTOSCOPY WITH RETROGRADE PYELOGRAM, URETEROSCOPY AND STENT EXCHANGE;  Surgeon: Mellie Sprinkle., MD;  Location: [facility]S;  Service: Urology;  Laterality: Right;   CYSTOSCOPY WITH URETEROSCOPY AND STENT PLACEMENT Right 10/25/2013   Procedure: CYSTOSCOPY RIGHT URETEROSCOPY HOLMIUM LASER LITHO AND STENT PLACEMENT;  Surgeon: Moise Anes, MD;  Location: [facility];  Service: Urology;  Laterality: Right;   HERNIA REPAIR  1995   umbilical ,   HOLMIUM LASER APPLICATION Right 01/13/2023   Procedure: HOLMIUM LASER APPLICATION;  Surgeon: Mellie Sprinkle., MD;  Location: [facility];  Service: Urology;  Laterality: Right;   INTRAOPERATIVE TRANSESOPHAGEAL ECHOCARDIOGRAM N/A 12/12/2014   Procedure: INTRAOPERATIVE TRANSESOPHAGEAL ECHOCARDIOGRAM;  Surgeon: Odie Benne, MD;  Location: [facility] OR;  Service: Cardiovascular;  Laterality: N/A;   KNEE ARTHROPLASTY  02/03/2012   Procedure: COMPUTER ASSISTED TOTAL KNEE ARTHROPLASTY;  Surgeon: Arnie Lao, MD;  [facility];  Service: Orthopedics;  Laterality: Right;  Right total knee arthroplasty   LEFT AND RIGHT HEART CATHETERIZATION WITH CORONARY ANGIOGRAM N/A 03/17/2013   Procedure: LEFT AND RIGHT HEART CATHETERIZATION WITH CORONARY ANGIOGRAM;  Surgeon: Odie Benne, MD;  Location: [facility] CATH LAB;  Service: Cardiovascular;  Laterality: N/A;   LEFT AND RIGHT HEART CATHETERIZATION WITH CORONARY/GRAFT ANGIOGRAM N/A 09/14/2014   Procedure: LEFT AND RIGHT HEART CATHETERIZATION WITH Estella Helling;  Surgeon: Odie Benne, MD;  Location: [facility] CATH LAB;  Service: Cardiovascular;  Laterality: N/A;   REVERSE SHOULDER ARTHROPLASTY  Left 03/06/2022   Procedure: LEFT SHOULDER REPLACEMENT APPLICATION OF WOUND VAC;  Surgeon: Jasmine Mesi, MD;  [facility];  Service: Orthopedics;  Laterality: Left;   REVERSE SHOULDER ARTHROPLASTY Right 09/15/2023   Procedure: REVERSE SHOULDER ARTHROPLASTY;  Surgeon: Jasmine Mesi, MD;  Location: [facility] OR;  Service: Orthopedics;  Laterality: Right;   TEE WITHOUT CARDIOVERSION N/A 03/14/2013   Procedure: TRANSESOPHAGEAL ECHOCARDIOGRAM (TEE);  Surgeon: Lenise Quince, MD;  Location: [facility] ENDOSCOPY;  Service: Cardiovascular;  Laterality: N/A;   TEE WITHOUT CARDIOVERSION N/A 11/14/2014   Procedure: TRANSESOPHAGEAL ECHOCARDIOGRAM (TEE);  Surgeon: Lenise Quince, MD;  Location: [facility] ENDOSCOPY;  Service: Cardiovascular;  Laterality: N/A;   TONSILLECTOMY     maybe as a child- does not know   TOTAL KNEE ARTHROPLASTY  09/10/2012   Procedure: TOTAL KNEE ARTHROPLASTY;  Surgeon: Arnie Lao, MD;  [facility];  Service: Orthopedics;  Laterality: Left;   TOTAL KNEE REVISION Right 07/15/2013   Procedure: REVISION ARTHROPLASTY RIGHT KNEE;  Surgeon: Arnie Lao, MD;  Location: [facility];  Service: Orthopedics;  Laterality: Right;   TRANSCATHETER AORTIC VALVE REPLACEMENT, TRANSFEMORAL N/A 12/12/2014   Procedure: TRANSCATHETER AORTIC VALVE REPLACEMENT, TRANSFEMORAL;  Surgeon: Odie Benne, MD;  Location: [facility] OR;  Service: Cardiovascular;  Laterality: N/A;   TRIGGER FINGER RELEASE  09/10/2012   Procedure: RELEASE TRIGGER FINGER/A-1 PULLEY;  Surgeon: Arnie Lao, MD;  Location: [facility];  Service: Orthopedics;  Laterality: Right;  Right Ring Finger   TUBAL LIGATION      Family Psychiatric History: Mother alcohol use and brother intellectual delay   Family History:  Family History  Problem Relation Age of Onset   Breast cancer Mother 75 - 46       stage IV at diagnosis   Emphysema Mother        smoked   Heart disease Mother    COPD Father        smoked    Asthma Father    Heart disease Father    Cancer Brother        Sinus   Colon cancer Neg Hx    Esophageal cancer Neg Hx    Inflammatory bowel disease Neg Hx    Liver disease Neg Hx    Pancreatic cancer Neg Hx    Rectal cancer Neg Hx    Stomach cancer  Neg Hx     Social History:  Social History   Socioeconomic History   Marital status: Married    Spouse name: Not on file   Number of children: 2   Years of education: Not on file   Highest education level: Not on file  Occupational History   Occupation: Disabled  Tobacco Use   Smoking status: Former    Current packs/day: 0.00    Average packs/day: 1.5 packs/day for 30.0 years (45.0 ttl pk-yrs)    Types: Cigarettes    Start date: 12/29/1970    Quit date: 12/29/2000    Years since quitting: 23.3   Smokeless tobacco: Never  Vaping Use   Vaping status: Never Used  Substance and Sexual Activity   Alcohol use: Not Currently   Drug use: Not Currently    Types: Marijuana    Comment: CBD oils through vape shops   Sexual activity: Not Currently    Birth control/protection: Surgical, Post-menopausal    Comment: tubal ligation  Other Topics Concern   Not on file  Social History Narrative   Not on file   Social Drivers of Health   Financial Resource Strain: Low Risk  (10/20/2023)   Overall Financial Resource Strain (CARDIA)    Difficulty of Paying Living Expenses: Not very hard  Food Insecurity: No Food Insecurity (12/04/2023)   Hunger Vital Sign    Worried About Running Out of Food in the Last Year: Never true    Ran Out of Food in the Last Year: Never true  Transportation Needs: No Transportation Needs (09/21/2023)   PRAPARE - Administrator, Civil Service (Medical): No    Lack of Transportation (Non-Medical): No  Physical Activity: Inactive (01/18/2024)   Exercise Vital Sign    Days of Exercise per Week: 0 days    Minutes of Exercise per Session: 0 min  Stress: No Stress Concern Present (03/17/2024)   [employer] of Occupational Health - Occupational Stress Questionnaire    Feeling of Stress : Only a little  Social Connections: Moderately Isolated (10/20/2023)   Social Connection and Isolation Panel [NHANES]    Frequency of Communication with Friends and Family: More than three times a week    Frequency of Social Gatherings with Friends and Family: More than three times a week    Attends Religious Services: Never    Database administrator or Organizations: No    Attends Engineer, structural: Never    Marital Status: Married    Allergies: No Known Allergies  Metabolic Disorder Labs: Lab Results  Component Value Date   HGBA1C 6.6 (A) 12/01/2023   MPG 157 09/09/2023   MPG 143 01/08/2023   No results found for: "PROLACTIN" Lab Results  Component Value Date   CHOL 165 12/25/2022   TRIG 78 12/25/2022   HDL 49 12/25/2022   CHOLHDL 3.4 12/25/2022   VLDL 20 12/28/2015   LDLCALC 101 (H) 12/25/2022   LDLCALC 87 06/04/2022   Lab Results  Component Value Date   TSH 2.820 09/25/2020   TSH 2.918 09/21/2013    Therapeutic Level Labs: No results found for: "LITHIUM" No results found for: "VALPROATE" No results found for: "CBMZ"  Current Medications: Current Outpatient Medications  Medication Sig Dispense Refill   hydrOXYzine (ATARAX) 50 MG tablet Take 1 tablet (50 mg total) by mouth at bedtime as needed. 30 tablet 3   acetaminophen  (TYLENOL ) 500 MG tablet Take 1,000 mg by mouth every 6 (six) hours as  needed for moderate pain.     amLODipine  (NORVASC ) 5 MG tablet Take 1 tablet (5 mg total) by mouth daily. 90 tablet 2   aspirin  EC 81 MG tablet Take 1 tablet (81 mg total) by mouth daily. Swallow whole. 30 tablet 12   atorvastatin  (LIPITOR ) 80 MG tablet Take 1 tablet (80 mg total) by mouth daily. 90 tablet 3   Blood Pressure Monitoring (BLOOD PRESSURE CUFF) MISC Please follow insert instructions. 1 each 0   busPIRone  (BUSPAR ) 15 MG tablet Take 1 tablet (15 mg total) by mouth  2 (two) times daily. 60 tablet 3   clopidogrel  (PLAVIX ) 75 MG tablet Take 1 tablet (75 mg total) by mouth every morning. 90 tablet 3   DULoxetine  (CYMBALTA ) 30 MG capsule Take 1 capsule (30 mg total) by mouth daily. 30 capsule 3   Evolocumab  (REPATHA  SURECLICK) 140 MG/ML SOAJ Inject 140 mg into the skin every 14 (fourteen) days. 6 mL 3   ezetimibe  (ZETIA ) 10 MG tablet Take 1 tablet by mouth once daily 90 tablet 3   Fluticasone -Umeclidin-Vilant (TRELEGY ELLIPTA ) 100-62.5-25 MCG/ACT AEPB Inhale 1 puff into the lungs daily.     gabapentin  (NEURONTIN ) 300 MG capsule TAKE 1 CAPSULE BY MOUTH IN THE MORNING, 1 IN THE AFTERNOON, AND 3 AT BEDTIME 150 capsule 0   hydrocortisone  cream 1 % Apply 1 Application topically daily as needed for itching.     losartan -hydrochlorothiazide  (HYZAAR) 50-12.5 MG tablet Take 1 tablet by mouth daily. 90 tablet 3   metFORMIN  (GLUCOPHAGE -XR) 500 MG 24 hr tablet Take 1 tablet by mouth twice daily 180 tablet 0   metoprolol  succinate (TOPROL -XL) 50 MG 24 hr tablet Take 1 tablet (50 mg total) by mouth daily. Take with or immediately following a meal.TAKE 1 TABLET BY MOUTH ONCE DAILY WITH MEALS OR  IMMEDIATELY  FOLLOWING  A  MEAL 90 tablet 2   montelukast  (SINGULAIR ) 10 MG tablet Take 1 tablet (10 mg total) by mouth at bedtime. 90 tablet 1   Semaglutide -Weight Management (WEGOVY ) 2.4 MG/0.75ML SOAJ INJECT 2.4 MG INTO THE SKIN ONCE A WEEK 4 mL 3   traZODone  (DESYREL ) 150 MG tablet Take 1 tablet (150 mg total) by mouth at bedtime. 30 tablet 3   VENTOLIN  HFA 108 (90 Base) MCG/ACT inhaler Inhale 2 puffs into the lungs every 6 (six) hours as needed for wheezing or shortness of breath. 18 g 3   No current facility-administered medications for this visit.     Musculoskeletal: Strength & Muscle Tone: within normal limits and Telehealth visit Gait & Station: normal, Telehealth Patient leans: N/A  Psychiatric Specialty Exam: Review of Systems  Last menstrual period 02/03/2012.There  is no height or weight on file to calculate BMI.  General Appearance: Well Groomed  Eye Contact:  Good  Speech:  Clear and Coherent and Normal Rate  Volume:  Normal  Mood:  Euthymic  Affect:  Appropriate and Congruent  Thought Process:  Coherent, Goal Directed, and Linear  Orientation:  Full (Time, Place, and Person)  Thought Content: WDL and Logical   Suicidal Thoughts:  No  Homicidal Thoughts:  No  Memory:  Immediate;   Good Recent;   Good Remote;   Good  Judgement:  Good  Insight:  Good  Psychomotor Activity:  Normal  Concentration:  Concentration: Good and Attention Span: Good  Recall:  Good  Fund of Knowledge: Good  Language: Good  Akathisia:  No  Handed:  Right  AIMS (if indicated): not done  Assets:  Communication Skills Desire for Improvement Financial Resources/Insurance Housing Leisure Time Physical Health Social Support  ADL's:  Intact  Cognition: WNL  Sleep:  Fair   Screenings: GAD-7    Flowsheet Row Video Visit from 05/11/2024 in [facility] Video Visit from 02/17/2024 in [facility] Video Visit from 12/15/2023 in [facility] Video Visit from 07/08/2023 in [facility] Video Visit from 04/29/2023 in [facility]  Total GAD-7 Score 2 6 4 3 4       PHQ2-9    Flowsheet Row Video Visit from 05/11/2024 in [facility] Coordination from 04/29/2024 in [facility] POPULATION HEALTH DEPARTMENT Video Visit from 02/17/2024 in [facility] Video Visit from 12/15/2023 in [facility] Office Visit from 08/18/2023 in [facility] Of [place]  PHQ-2 Total Score 0 1 0 0 0  PHQ-9 Total Score 4 -- 6 1 --      Flowsheet Row Admission (Discharged) from 09/15/2023 in [facility]  [facility] SPINE  CENTER Pre-Admission Testing 60 from 09/09/2023 in [facility] PREADMISSION TESTING Admission (Discharged) from 01/13/2023 in [place] PERIOPERATIVE AREA  C-SSRS RISK CATEGORY No Risk No Risk No Risk        Assessment and Plan: Patient reports that her sleep has been poor but notes that her anxiety and depression are well-managed.Today patient is agreeable to trialing hydroxyzine 50 mg at night to help manage sleep.  She will continue her other medications as prescribed.  Patient's Cymbalta  was increased by her PCP from 20 mg to 30 mg.  Provider will continue her at this current dose.   1. GAD (generalized anxiety disorder)  Continue- DULoxetine  (CYMBALTA ) 30 MG capsule; Take 1 capsule (30 mg total) by mouth daily.  Dispense: 30 capsule; Refill: 3 Continue- traZODone  (DESYREL ) 150 MG tablet; Take 1 tablet (150 mg total) by mouth at bedtime.  Dispense: 30 tablet; Refill: 3 Continue- hydrOXYzine (ATARAX) 50 MG tablet; Take 1 tablet (50 mg total) by mouth at bedtime as needed.  Dispense: 30 tablet; Refill: 3 Continue- busPIRone  (BUSPAR ) 15 MG tablet; Take 1 tablet (15 mg total) by mouth 2 (two) times daily.  Dispense: 60 tablet; Refill: 3  2. PTSD (post-traumatic stress disorder)  Continue- DULoxetine  (CYMBALTA ) 30 MG capsule; Take 1 capsule (30 mg total) by mouth daily.  Dispense: 30 capsule; Refill: 3 Continue- traZODone  (DESYREL ) 150 MG tablet; Take 1 tablet (150 mg total) by mouth at bedtime.  Dispense: 30 tablet; Refill: 3 Continue- busPIRone  (BUSPAR ) 15 MG tablet; Take 1 tablet (15 mg total) by mouth 2 (two) times daily.  Dispense: 60 tablet; Refill: 3  3. Mild depression  continue- DULoxetine  (CYMBALTA ) 30 MG capsule; Take 1 capsule (30continue mg total) by mouth daily.  Dispense: 30 capsule; Refill: 3 -continue traZODone  (DESYREL ) 150 MG tablet; Take 1 tablet (150 mg total) by mouth at bedtime.  Dispense: 30 tablet; Refill: 3 Start- hydrOXYzine (ATARAX) 50 MG tablet;  Take 1 tablet (50 mg total) by mouth at bedtime as needed.  Dispense: 30 tablet; Refill: 3 Continue- busPIRone  (BUSPAR ) 15 MG tablet; Take 1 tablet (15 mg total) by mouth 2 (two) times daily.  Dispense: 60 tablet; Refill: 3   Consent: Patient/Guardian gives verbal consent for treatment and assignment of benefits for services provided during this visit. Patient/Guardian expressed understanding and agreed  to proceed.   Follow-up in 2.5 months Follow-up therapy Arlyne Bering, NP 05/11/2024, 1:43 PM

## 2024-05-16 ENCOUNTER — Telehealth: Payer: Self-pay | Admitting: *Deleted

## 2024-05-16 ENCOUNTER — Other Ambulatory Visit: Payer: Self-pay | Admitting: *Deleted

## 2024-05-16 DIAGNOSIS — J441 Chronic obstructive pulmonary disease with (acute) exacerbation: Secondary | ICD-10-CM

## 2024-05-16 MED ORDER — VENTOLIN HFA 108 (90 BASE) MCG/ACT IN AERS: 2.0000 | INHALATION_SPRAY | Freq: Four times a day (QID) | RESPIRATORY_TRACT | 3 refills | Status: DC | PRN

## 2024-05-16 NOTE — Telephone Encounter (Signed)
 Copied from CRM [id number]. Topic: Clinical - Prescription Issue >> May 12, 2024  5:40 PM Brynn Caras wrote: Reason for CRM: The patient contacted [facility] in regards to her refill request of Ventolin  HFA 108 (90 Base) MCG/ACT inhaler, they advised an order for this has not been received as of yet. The patient requested this medication to be refilled on 05/13, I advised the 3 business day turn-around for all Rx request of refills. The patient understood.

## 2024-05-16 NOTE — Telephone Encounter (Signed)
 Copied from CRM [id number]. Topic: Clinical - Prescription Issue >> May 12, 2024  5:40 PM [name] wrote: Reason for CRM: The patient contacted [facility] in regards to her refill request of Ventolin  HFA 108 (90 Base) MCG/ACT inhaler, they advised an order for this has not been received as of yet. The patient requested this medication to be refilled on 05/13, I advised the 3 business day turn-around for all Rx request of refills. The patient understood.

## 2024-05-18 ENCOUNTER — Telehealth: Payer: Self-pay | Admitting: *Deleted

## 2024-05-18 NOTE — Telephone Encounter (Signed)
 Copied from CRM [id number]. Topic: General - Other >> May 18, 2024 10:10 AM Adrianna P wrote: Reason for CRM: Patient wants to know if message was received from her insurance company for approval of MRI. Please call pt >> May 18, 2024  3:53 PM Retta Caster wrote: MR Lumbar Spine Wo Contrast (Order [id number]) MR Pelvis [name] Contrast (Order [id number])  Patient needs call back on clarification for why 1 order was denied and 1 was approved. Needs call back [phone]

## 2024-05-18 NOTE — Telephone Encounter (Signed)
 LMOM that only the following MRI was Approved and to please call back to speakwith a Referral Coor/if any questions.  Case Description:Lumbar Spine MRIRequest ID: Tracking: Client [id number] Request Date:04/28/2024 01:56 PMStatus:Approved Entry Method:RadMDValidity Dates: 04/28/2024-06/27/2024    Copied from CRM #[id number]. Topic: General - Other >> May 18, 2024 10:10 AM Adrianna P wrote: Reason for CRM: Patient wants to know if message was received from her insurance company for approval of MRI. Please call pt

## 2024-05-19 ENCOUNTER — Telehealth: Payer: Self-pay | Admitting: *Deleted

## 2024-05-19 NOTE — Telephone Encounter (Signed)
 Will forward to C. Boone. Copied from CRM [id number]. Topic: General - Other >> May 18, 2024 10:10 AM [name] wrote: Reason for CRM: Patient wants to know if message was received from her insurance company for approval of MRI. Please call pt >> May 18, 2024  3:53 PM [name] wrote: MR Lumbar Spine Wo Contrast (Order [id number]) MR Pelvis [name] Contrast (Order [id number])  Patient needs call back on clarification for why 1 order was denied and 1 was approved. Needs call back [phone]

## 2024-05-26 ENCOUNTER — Ambulatory Visit (HOSPITAL_COMMUNITY)

## 2024-05-27 ENCOUNTER — Other Ambulatory Visit: Payer: Self-pay

## 2024-05-27 NOTE — Patient Outreach (Signed)
 Complex Care Management   Visit Note  05/27/2024  Name:  [name] MRN: [id number] DOB: Dec 30, 1960  Situation: Referral received for Complex Care Management related to R Hip Pain I obtained verbal consent from Patient.  Visit completed with patient  on the phone  Background:   Past Medical History:  Diagnosis Date   Anemia    Anxiety    Aortic valve stenosis, severe    Arthritis    PAIN AND SEVERE OA LEFT KNEE ; S/P RIGHT TKA ON 02/03/12; HAS LOWER BACK PAIN-UNABLE TO STAND MORE THAN 10 MIN; ARTHRITIS "ALL OVER"   Asthma    Blood transfusion    2013[facility]   Breast cancer in female [facility])    Right   CAD (coronary artery disease)    Cath 2010 with DES x 1 RCA-- PT'S CARDIOLOGIST IS DR. MCALHANY   Chronic diastolic congestive heart failure (HCC)    COPD (chronic obstructive pulmonary disease) (HCC)    Mild   COPD exacerbation (HCC) 11/16/2022   Depression    Diabetes mellitus DIAGNOSED IN2010   Dyspnea    with much ambulation   Eczema    on back   Headache    migraines younger- rare now 02/07/21   Heart murmur    no current problems   History of hiatal hernia    History of kidney stones    passed or blasted   History of UTI 12/05/2022   Hyperlipidemia    Hypertension    Morbid obesity with body mass index of 60.0-69.9 in adult [facility])    Myocardial infarction (HCC)    PT THINKS SHE WAS DX WITH MI AT THE TIME OF HEART STENTING   Neuromuscular disorder (HCC)    bilateral arm/hands   Oxygen dependent    uses 3L oxygen night/prn   Personal history of radiation therapy    Pneumonia    Pulmonary embolism (HCC) 02/08/2012   S/P RT TOTAL KNEE ON 02/03/12--ON 02/08/12--DEVELOPED ACUTE SOB AND CHEST PAIN--AND DIAGNOSED WITH  PULMONARY EMBOLUS AND PNEUMONIA   Restless leg syndrome    Sleep apnea    Uterine fibroid    NO PROBLEMS AT PRESENT FROM THE FIBROIDS-STATES SHE IS POST MENOPAUSAL-LAST MENSES 2010 EXCEPT FOR EPISODE THIS YR OF BLEEDING RELATED TO FIBROIDS.   Weakness     BOTH HANDS - S/P BILATERAL CARPAL TUNNEL RELEASE--BUT STILL HAS WEAKNESS--OFTEN DROPS THINGS    Assessment:  [name] was unable to attend her scheduled appointment for her MRI and has rescheduled it for Sunday, May 29, 2024. The procedure will focus on her hip and pelvis. During our next visit, we will have the opportunity to discuss the treatment plan as outlined by her physician.   Patient Reported Symptoms:  Cognitive Cognitive Status: Able to follow simple commands, Alert and oriented to person, place, and time, Normal speech and language skills      Neurological Neurological Review of Symptoms: No symptoms reported    HEENT HEENT Symptoms Reported: No symptoms reported      Cardiovascular Cardiovascular Symptoms Reported: Swelling in legs or feet (left foot) Does patient have uncontrolled Hypertension?: No Cardiovascular Conditions: Heart failure Cardiovascular Management Strategies: Medication therapy  Respiratory Respiratory Symptoms Reported: Chest tightness, Shortness of breath Additional Respiratory Details: with exert Respiratory Conditions: COPD Respiratory Comment: uses her inhaler and somtimes her rescue inhaler  Endocrine Is patient diabetic?: Yes Is patient checking blood sugars at home?: No Endocrine Conditions: Diabetes Endocrine Management Strategies: Medication therapy  Gastrointestinal Gastrointestinal  Symptoms Reported: Constipation, Diarrhea Gastrointestinal Conditions: Diarrhea, Constipation Gastrointestinal Management Strategies: Medication therapy, Fluid modification Gastrointestinal Self-Management Outcome: 4 (good)    Genitourinary Genitourinary Symptoms Reported: Incontinence Genitourinary Conditions: Incontinence Genitourinary Management Strategies: Incontinence garment/pad  Integumentary Integumentary Symptoms Reported: No symptoms reported    Musculoskeletal          Psychosocial       Quality of Family Relationships:  supportive Do you feel physically threatened by others?: No      05/27/2024    3:03 PM  Depression screen PHQ 2/9  Decreased Interest 0  Down, Depressed, Hopeless 0  PHQ - 2 Score 0    There were no vitals filed for this visit.  Medications Reviewed Today     Reviewed by Augustin Leber, RN (Registered Nurse) on 05/27/24 at 1453  Med List Status: <None>   Medication Order Taking? Sig Documenting Provider Last Dose Status Informant  acetaminophen  (TYLENOL ) 500 MG tablet [id number] Yes Take 1,000 mg by mouth every 6 (six) hours as needed for moderate pain. [provider] Taking Active Self  amLODipine  (NORVASC ) 5 MG tablet [id number]  Take 1 tablet (5 mg total) by mouth daily. Toy Freund, MD  Expired 04/20/24 2359 Self  aspirin  EC 81 MG tablet [id number] Yes Take 1 tablet (81 mg total) by mouth daily. Swallow whole. Magnant, Justice Olp, PA-C Taking Active   atorvastatin  (LIPITOR ) 80 MG tablet [id number] Yes Take 1 tablet (80 mg total) by mouth daily. Odie Benne, MD Taking Active   Blood Pressure Monitoring (BLOOD PRESSURE CUFF) MISC [id number] Yes Please follow insert instructions. Arellano Zameza, Priscila, MD Taking Active Self  busPIRone  (BUSPAR ) 15 MG tablet [id number] Yes Take 1 tablet (15 mg total) by mouth 2 (two) times daily. Arlyne Bering, NP Taking Active   clopidogrel  (PLAVIX ) 75 MG tablet [id number] Yes Take 1 tablet (75 mg total) by mouth every morning. Odie Benne, MD Taking Active   DULoxetine  (CYMBALTA ) 30 MG capsule [id number] Yes Take 1 capsule (30 mg total) by mouth daily. Arlyne Bering, NP Taking Active   Evolocumab  (REPATHA  SURECLICK) 140 MG/ML SOAJ [id number] Yes Inject 140 mg into the skin every 14 (fourteen) days. Lasandra Points, MD Taking Active Self  ezetimibe  (ZETIA ) 10 MG tablet [id number] Yes Take 1 tablet by mouth once daily Odie Benne, MD Taking Active     Discontinued 05/09/20 1429    Fluticasone -Umeclidin-Vilant (TRELEGY ELLIPTA ) 100-62.5-25 MCG/ACT AEPB [id number]  Inhale 1 puff into the lungs daily. [provider]  Active   gabapentin  (NEURONTIN ) 300 MG capsule [id number] Yes TAKE 1 CAPSULE BY MOUTH IN THE MORNING, 1 IN THE AFTERNOON, AND 3 AT BEDTIME Arellano Zameza, Priscila, MD Taking Active            Med Note Arleta Lade, Laymon Priest   Fri Apr 29, 2024  9:18 AM) Takes 2 pills iin the morning 2 in the afternoon and to at night  hydrocortisone  cream 1 % [id number] Yes Apply 1 Application topically daily as needed for itching. [provider] Taking Active Self  hydrOXYzine  (ATARAX ) 50 MG tablet [id number] Yes Take 1 tablet (50 mg total) by mouth at bedtime as needed. Arlyne Bering, NP Taking Active   losartan -hydrochlorothiazide  (HYZAAR) 50-12.5 MG tablet [id number] Yes Take 1 tablet by mouth daily. Odie Benne, MD Taking Active   metFORMIN  (GLUCOPHAGE -XR) 500 MG 24 hr tablet [id number]  Take 1 tablet by mouth twice daily Arellano Zameza, Priscila, MD  Active   metoprolol  succinate (TOPROL -XL)  50 MG 24 hr tablet [id number] Yes Take 1 tablet (50 mg total) by mouth daily. Take with or immediately following a meal.TAKE 1 TABLET BY MOUTH ONCE DAILY WITH MEALS OR  IMMEDIATELY  FOLLOWING  A  MEAL Arellano Zameza, Priscila, MD Taking Active   montelukast  (SINGULAIR ) 10 MG tablet [id number] Yes Take 1 tablet (10 mg total) by mouth at bedtime. Arellano Zameza, Priscila, MD Taking Active   Semaglutide -Weight Management (WEGOVY ) 2.4 MG/0.75ML SOAJ [id number] Yes INJECT 2.4 MG INTO THE SKIN ONCE A WEEK Atway, Rayann N, DO Taking Active   traZODone  (DESYREL ) 150 MG tablet [id number] Yes Take 1 tablet (150 mg total) by mouth at bedtime. Arlyne Bering, NP Taking Active   VENTOLIN  HFA 108 (90 Base) MCG/ACT inhaler [id number] Yes Inhale 2 puffs into the lungs every 6 (six) hours as needed for wheezing or shortness of breath. Arellano Zameza, Priscila, MD Taking Active              Recommendation:   Go to Radiology appointment 05/29/24  Follow Up Plan:   Telephone follow up appointment date/time:  06/29/24  245 pm  Augustin Leber RN, BSN, [facility]  [facility], [facility]  Care Coordinator Phone: [phone]

## 2024-05-27 NOTE — Patient Instructions (Signed)
 Visit Information  [name] was given information about Medicaid Managed Care team care coordination services as a part of their [facility] Medicaid benefit. [name] verbally consented to engagement with the [facility] Managed Care team.   If you are experiencing a medical emergency, please call 911 or report to your local emergency department or urgent care.   If you have a non-emergency medical problem during routine business hours, please contact your provider's office and ask to speak with a nurse.   For questions related to your [facility] health plan, please call: [phone] or go here:[email or web address]  If you would like to schedule transportation through your [facility] plan, please call the following number at least 2 days in advance of your appointment: [phone].   You can also use the MTM portal or MTM mobile app to manage your rides. Reimbursement for transportation is available through [facility]! For the portal, please go to [email or web address]  Call the [facility] Crisis Line at [phone], at any time, 24 hours a day, 7 days a week. If you are in danger or need immediate medical attention call 911.  If you would like help to quit smoking, call 1-800-QUIT-NOW (646-379-4168) OR Espaol: 1-855-Djelo-Ya (4-401-027-2536) o para ms informacin haga clic aqu or Text READY to 644-034 to register via text  [name] - following are the goals we discussed in your visit today:   Goals Addressed             This Visit's Progress    VBCI RN Care Plan       Problems:  Chronic Disease Management support and education needs related to Right Hip Pain  Goal: Over the next 30 days the Patient will attend all scheduled medical appointments: with PCP and specialist as evidenced by keeping al scheduled appointments        demonstrate Ongoing adherence to prescribed treatment plan for Right Hip Pain as evidenced by no admissions to the  hospital verbalize basic understanding of Right Hip Pain disease process and self health management plan as evidenced by verbal explanation lifestyle changes and consistent medication compliance   Interventions:   Pain Interventions: Pain assessment performed Medications reviewed Reviewed provider established plan for pain management Discussed importance of adherence to all scheduled medical appointments Counseled on the importance of reporting any/all new or changed pain symptoms or management strategies to pain management provider Advised patient to report to care team affect of pain on daily activities Discussed use of relaxation techniques and/or diversional activities to assist with pain reduction (distraction, imagery, relaxation, massage, acupressure, TENS, heat, and cold application Reviewed with patient prescribed pharmacological and nonpharmacological pain relief strategies Screening for signs and symptoms of depression related to chronic disease state  Assessed social determinant of health barriers Attend your appointment scheduled for 05/29/24 for Hip and Pelvis MRI  Patient Self-Care Activities:  Attend all scheduled provider appointments Call pharmacy for medication refills 3-7 days in advance of running out of medications Call provider office for new concerns or questions  Perform all self care activities independently  Perform IADL's (shopping, preparing meals, housekeeping, managing finances) independently Take medications as prescribed    Plan:  Telephone follow up appointment with care management team member scheduled for:  06/29/24  245 pm             Please see education materials related to Right Hip Pain provided by MyChart link.  The patient verbalized understanding of instructions, educational materials, and care plan provided today and DECLINED  offer to receive copy of patient instructions, educational materials, and care plan.   Telephone follow up  appointment with care management team member scheduled for: 06/29/24  245 pm  Augustin Leber RN, BSN, [facility] [place]  [facility], [facility]  Care Coordinator Phone: [phone]      Following is a copy of your plan of care:  There are no care plans that you recently modified to display for this patient.

## 2024-05-28 ENCOUNTER — Other Ambulatory Visit (HOSPITAL_COMMUNITY)

## 2024-05-29 ENCOUNTER — Ambulatory Visit (HOSPITAL_COMMUNITY)
Admission: RE | Admit: 2024-05-29 | Discharge: 2024-05-29 | Disposition: A | Discharge status: Home / Self Care | Source: Ambulatory Visit | Attending: Internal Medicine

## 2024-05-29 ENCOUNTER — Ambulatory Visit (HOSPITAL_COMMUNITY)
Admission: RE | Admit: 2024-05-29 | Discharge: 2024-05-29 | Disposition: A | Discharge status: Home / Self Care | Source: Ambulatory Visit | Attending: Internal Medicine | Admitting: Internal Medicine

## 2024-05-29 DIAGNOSIS — D259 Leiomyoma of uterus, unspecified: Secondary | ICD-10-CM | POA: Diagnosis not present

## 2024-05-29 DIAGNOSIS — M4805 Spinal stenosis, thoracolumbar region: Secondary | ICD-10-CM | POA: Diagnosis not present

## 2024-05-29 DIAGNOSIS — M7602 Gluteal tendinitis, left hip: Secondary | ICD-10-CM | POA: Diagnosis not present

## 2024-05-29 DIAGNOSIS — M7601 Gluteal tendinitis, right hip: Secondary | ICD-10-CM | POA: Diagnosis not present

## 2024-05-29 DIAGNOSIS — M25551 Pain in right hip: Secondary | ICD-10-CM | POA: Insufficient documentation

## 2024-05-29 DIAGNOSIS — K439 Ventral hernia without obstruction or gangrene: Secondary | ICD-10-CM | POA: Diagnosis not present

## 2024-05-29 DIAGNOSIS — M47817 Spondylosis without myelopathy or radiculopathy, lumbosacral region: Secondary | ICD-10-CM | POA: Diagnosis not present

## 2024-05-29 DIAGNOSIS — M47816 Spondylosis without myelopathy or radiculopathy, lumbar region: Secondary | ICD-10-CM | POA: Diagnosis not present

## 2024-05-29 DIAGNOSIS — M4807 Spinal stenosis, lumbosacral region: Secondary | ICD-10-CM | POA: Diagnosis not present

## 2024-05-29 MED ORDER — GADOBUTROL 1 MMOL/ML IV SOLN
10.0000 mL | Freq: Once | INTRAVENOUS | Status: AC | PRN
  Administered 2024-05-29: 10 mL via INTRAVENOUS

## 2024-06-01 ENCOUNTER — Telehealth: Payer: Self-pay | Admitting: *Deleted

## 2024-06-01 NOTE — Telephone Encounter (Signed)
 Copied from CRM [id number]. Topic: Clinical - Lab/Test Results >> Jun 01, 2024  9:57 AM Tisa Forester wrote: Reason for CRM: checking status of Mri done on sunday  Request is someone can go over the results from the MRI Patient call back [phone]

## 2024-06-02 ENCOUNTER — Other Ambulatory Visit: Payer: Self-pay | Admitting: Student

## 2024-06-02 DIAGNOSIS — E1142 Type 2 diabetes mellitus with diabetic polyneuropathy: Secondary | ICD-10-CM

## 2024-06-05 ENCOUNTER — Other Ambulatory Visit: Payer: Self-pay | Admitting: Student

## 2024-06-05 DIAGNOSIS — E119 Type 2 diabetes mellitus without complications: Secondary | ICD-10-CM

## 2024-06-06 ENCOUNTER — Telehealth: Payer: Self-pay | Admitting: *Deleted

## 2024-06-06 ENCOUNTER — Ambulatory Visit: Payer: Self-pay | Admitting: *Deleted

## 2024-06-06 NOTE — Telephone Encounter (Signed)
 Copied from CRM [id number]. Topic: Clinical - Red Word Triage >> Jun 06, 2024  9:25 AM [name] wrote: Red Word that prompted transfer to Nurse Triage: have severe and extreme pain in hip  when sitting down and when try to get up hip lock and been in a lot of pain .   patient call back : [phone] Reason for Disposition  Hip pain is a chronic symptom (recurrent or ongoing AND present > 4 weeks)    Pt calling in for her MRI results and a gabapentin  refill.  Answer Assessment - Initial Assessment Questions 1. LOCATION and RADIATION: "Where is the pain located?"      I'm having right hip pain.  I think it's sciatica.   Also arthritis.   My hip locks up   I can't stand up.   When I sit down it hurts really bad.   Standing also causes pain.     I had an MRI done last Sunday.   I don't understand the results.   I want to know what it says about my hip.   I need to talk with my doctor about the results.    I also need a refill of my gabapentin .   The pharmacy said they had not heard back from my doctor.   2. QUALITY: "What does the pain feel like?"  (e.g., sharp, dull, aching, burning)     Severe hip pain. 3. SEVERITY: "How bad is the pain?" "What does it keep you from doing?"   (Scale 1-10; or mild, moderate, severe)   -  MILD (1-3): doesn't interfere with normal activities    -  MODERATE (4-7): interferes with normal activities (e.g., work or school) or awakens from sleep, limping    -  SEVERE (8-10): excruciating pain, unable to do any normal activities, unable to walk     I need to speak with someone about my prescriptions.   The pharmacy is not getting a response about the prescriptions.     Gabapentin  I need a refill for.    4. ONSET: "When did the pain start?" "Does it come and go, or is it there all the time?"     This has been going on for a while.   I'm needing my MRI results. 5. WORK OR EXERCISE: "Has there been any recent work or exercise that involved this part of the body?"      Not  asked since an ongoing problem.   Triage stopped since she is calling in about her MRI results. 6. CAUSE: "What do you think is causing the hip pain?"      Not asked 7. AGGRAVATING FACTORS: "What makes the hip pain worse?" (e.g., walking, climbing stairs, running)     Sitting, standing and walking 8. OTHER SYMPTOMS: "Do you have any other symptoms?" (e.g., back pain, pain shooting down leg,  fever, rash)     See above  Protocols used: Hip Pain-A-AH FYI Only or Action Required?:   Pt calling in for her MRI results.   Scan done last Sunday and no one has called the results to her. Also requesting a refill of her gabapentin .     Action required by provider  Patient was last seen in primary care on 04/21/2024 by Arellano Zameza, Priscila, MD. Called Nurse Triage reporting Hip Pain. Symptoms began several months ago. Interventions attempted: Prescription medications: gabapentin . Symptoms are: gradually worsening.  Triage Disposition: See PCP Within 2 Weeks Patient/caregiver understands and will follow disposition?:  Yes.  No appt needed.   Message sent.

## 2024-06-06 NOTE — Telephone Encounter (Signed)
 Copied from CRM [id number]. Topic: Clinical - Lab/Test Results >> Jun 06, 2024  9:23 AM Tisa Forester wrote: Reason for CRM: need status of MRI on hip was done on last sunday still have severe and extreme pain in hip  when sitting down and when try to get up hip lock and been in a lot of pain .   patient call back : [phone]

## 2024-06-06 NOTE — Telephone Encounter (Signed)
 Medication sent to pharmacy

## 2024-06-09 DIAGNOSIS — Z419 Encounter for procedure for purposes other than remedying health state, unspecified: Secondary | ICD-10-CM | POA: Diagnosis not present

## 2024-06-14 ENCOUNTER — Ambulatory Visit: Payer: Self-pay | Admitting: Student

## 2024-06-14 ENCOUNTER — Telehealth: Payer: Self-pay | Admitting: *Deleted

## 2024-06-14 ENCOUNTER — Other Ambulatory Visit: Payer: Self-pay | Admitting: Student

## 2024-06-14 NOTE — Progress Notes (Signed)
 Discussed results of MR Lumbar spine with patient over the phone. States symptoms are about the same as April visit including tenderness in lower back, right hip pain, and feeling like her right lower extremity is weaker/locks/gives out. Still having increased urinary frequency and some fecal incontinence. Imaging showed mild-to-moderate spinal canal stenosis at T12-L1 due to disc bulge and facet arthropathy, moderate spinal stenosis and left neural foraminal narrowing around L3-4, and periarticular edema and joint effusion on the left at L5-S1 with moderate bilateral neural foraminal narrowing. Patient previously operated on by Dr. Richardo Chandler [facility] spine surgeon) in 2022 for lumbar TLIF Left L4-5 for spondylolisthesis. Has since retired. Recommended making appointment with [facility] for help reviewing images and helping patient determine next steps in care. Patient agreeable to plan. Recommended follow up visit at [facility] to help manage other chronic health concerns, patient will call to make appointment.

## 2024-06-14 NOTE — Telephone Encounter (Unsigned)
 Copied from CRM [id number]. Topic: Clinical - Lab/Test Results >> Jun 14, 2024 12:02 PM Adrianna P wrote: Reason for CRM: Patient needs help understanding mri results. Please call patient at [phone]

## 2024-06-14 NOTE — Progress Notes (Signed)
 Spoke to patient on the phone regarding MRI Pelvis for acute right hip pain with associated incontinence. Results notable for mild bladder wall thickening with smooth mucosal enhancement, stable ventral hernias, mild asymmetric right hip degenerative changes, and postsurgical and degenerative changes in the lumbar spine. Patient has significant urological history. Has intermittent dysuria and increased urinary frequency. No current pain or hematuria noted. No concern for cystitis at this time, however recommended that patient come in person next time she suspects UTI as she often calls it in as a telehealth visit. Also recommended discussing right hip pain with Dr. Lucienne Ryder who performed her knee surgeries since he also specializes in hip pain. Patient will call [facility] to discuss MRI spine results and will mention right hip pain as well. Will continue to monitor patient for worsening symptoms.

## 2024-06-22 ENCOUNTER — Other Ambulatory Visit: Payer: Self-pay | Admitting: Internal Medicine

## 2024-06-28 ENCOUNTER — Ambulatory Visit: Payer: Self-pay | Admitting: Student

## 2024-06-28 VITALS — BP 114/66 | HR 80 | Temp 97.9°F | Ht 60.0 in | Wt 271.4 lb

## 2024-06-28 DIAGNOSIS — Z7984 Long term (current) use of oral hypoglycemic drugs: Secondary | ICD-10-CM

## 2024-06-28 DIAGNOSIS — M25551 Pain in right hip: Secondary | ICD-10-CM

## 2024-06-28 DIAGNOSIS — I152 Hypertension secondary to endocrine disorders: Secondary | ICD-10-CM | POA: Diagnosis not present

## 2024-06-28 DIAGNOSIS — Z6841 Body Mass Index (BMI) 40.0 and over, adult: Secondary | ICD-10-CM | POA: Diagnosis not present

## 2024-06-28 DIAGNOSIS — E119 Type 2 diabetes mellitus without complications: Secondary | ICD-10-CM

## 2024-06-28 DIAGNOSIS — E1159 Type 2 diabetes mellitus with other circulatory complications: Secondary | ICD-10-CM | POA: Diagnosis not present

## 2024-06-28 LAB — POCT GLYCOSYLATED HEMOGLOBIN (HGB A1C): Hemoglobin A1C: 6.1 % — AB (ref 4.0–5.6)

## 2024-06-28 LAB — GLUCOSE, CAPILLARY: Glucose-Capillary: 156 mg/dL — ABNORMAL HIGH (ref 70–99)

## 2024-06-28 NOTE — Progress Notes (Signed)
 CC: Routine clinic follow-up  HPI:  [name] is a 63 y.o. female living with a history stated below and presents today for routine visit follow-up. Please see problem based assessment and plan for additional details.  Past Medical History:  Diagnosis Date   Anemia    Anxiety    Aortic valve stenosis, severe    Arthritis    PAIN AND SEVERE OA LEFT KNEE ; S/P RIGHT TKA ON 02/03/12; HAS LOWER BACK PAIN-UNABLE TO STAND MORE THAN 10 MIN; ARTHRITIS ALL OVER   Asthma    Blood transfusion    2013[facility]   Breast cancer in female [facility])    Right   CAD (coronary artery disease)    Cath 2010 with DES x 1 RCA-- PT'S CARDIOLOGIST IS DR. MCALHANY   Chronic diastolic congestive heart failure (HCC)    COPD (chronic obstructive pulmonary disease) (HCC)    Mild   COPD exacerbation (HCC) 11/16/2022   Depression    Diabetes mellitus DIAGNOSED IN2010   Dyspnea    with much ambulation   Eczema    on back   Headache    migraines younger- rare now 02/07/21   Heart murmur    no current problems   History of hiatal hernia    History of kidney stones    passed or blasted   History of UTI 12/05/2022   Hyperlipidemia    Hypertension    Morbid obesity with body mass index of 60.0-69.9 in adult [facility])    Myocardial infarction (HCC)    PT THINKS SHE WAS DX WITH MI AT THE TIME OF HEART STENTING   Neuromuscular disorder (HCC)    bilateral arm/hands   Oxygen dependent    uses 3L oxygen night/prn   Personal history of radiation therapy    Pneumonia    Pulmonary embolism (HCC) 02/08/2012   S/P RT TOTAL KNEE ON 02/03/12--ON 02/08/12--DEVELOPED ACUTE SOB AND CHEST PAIN--AND DIAGNOSED WITH  PULMONARY EMBOLUS AND PNEUMONIA   Restless leg syndrome    Sleep apnea    Uterine fibroid    NO PROBLEMS AT PRESENT FROM THE FIBROIDS-STATES SHE IS POST MENOPAUSAL-LAST MENSES 2010 EXCEPT FOR EPISODE THIS YR OF BLEEDING RELATED TO FIBROIDS.   Weakness    BOTH HANDS - S/P BILATERAL CARPAL TUNNEL  RELEASE--BUT STILL HAS WEAKNESS--OFTEN DROPS THINGS    Current Outpatient Medications on File Prior to Visit  Medication Sig Dispense Refill   acetaminophen  (TYLENOL ) 500 MG tablet Take 1,000 mg by mouth every 6 (six) hours as needed for moderate pain.     amLODipine  (NORVASC ) 5 MG tablet Take 1 tablet (5 mg total) by mouth daily. 90 tablet 2   aspirin  EC 81 MG tablet Take 1 tablet (81 mg total) by mouth daily. Swallow whole. 30 tablet 12   atorvastatin  (LIPITOR ) 80 MG tablet Take 1 tablet (80 mg total) by mouth daily. 90 tablet 3   Blood Pressure Monitoring (BLOOD PRESSURE CUFF) MISC Please follow insert instructions. 1 each 0   busPIRone  (BUSPAR ) 15 MG tablet Take 1 tablet (15 mg total) by mouth 2 (two) times daily. 60 tablet 3   clopidogrel  (PLAVIX ) 75 MG tablet Take 1 tablet (75 mg total) by mouth every morning. 90 tablet 3   DULoxetine  (CYMBALTA ) 30 MG capsule Take 1 capsule (30 mg total) by mouth daily. 30 capsule 3   Evolocumab  (REPATHA  SURECLICK) 140 MG/ML SOAJ Inject 140 mg into the skin every 14 (fourteen) days. 6 mL 3   ezetimibe  (  ZETIA ) 10 MG tablet Take 1 tablet by mouth once daily 90 tablet 3   Fluticasone -Umeclidin-Vilant (TRELEGY ELLIPTA ) 100-62.5-25 MCG/ACT AEPB Inhale 1 puff into the lungs daily.     gabapentin  (NEURONTIN ) 300 MG capsule TAKE 1 CAPSULE BY MOUTH IN THE MORNING, 1 CAPSULE IN THE AFTERNOON AND 3 CAPSULES AT BEDTIME 150 capsule 0   hydrocortisone  cream 1 % Apply 1 Application topically daily as needed for itching.     hydrOXYzine  (ATARAX ) 50 MG tablet Take 1 tablet (50 mg total) by mouth at bedtime as needed. 30 tablet 3   losartan -hydrochlorothiazide  (HYZAAR) 50-12.5 MG tablet Take 1 tablet by mouth daily. 90 tablet 3   metFORMIN  (GLUCOPHAGE -XR) 500 MG 24 hr tablet Take 1 tablet by mouth twice daily 180 tablet 0   metoprolol  succinate (TOPROL -XL) 50 MG 24 hr tablet Take 1 tablet (50 mg total) by mouth daily. Take with or immediately following a meal.TAKE 1 TABLET  BY MOUTH ONCE DAILY WITH MEALS OR  IMMEDIATELY  FOLLOWING  A  MEAL 90 tablet 2   montelukast  (SINGULAIR ) 10 MG tablet TAKE 1 TABLET BY MOUTH AT BEDTIME 90 tablet 0   traZODone  (DESYREL ) 150 MG tablet Take 1 tablet (150 mg total) by mouth at bedtime. 30 tablet 3   VENTOLIN  HFA 108 (90 Base) MCG/ACT inhaler Inhale 2 puffs into the lungs every 6 (six) hours as needed for wheezing or shortness of breath. 18 g 3   WEGOVY  2.4 MG/0.75ML SOAJ INJECT 2.4 MG INTO THE SKIN ONCE A WEEK 4 mL 0   [DISCONTINUED] Fluticasone -Salmeterol (ADVAIR  DISKUS) 100-50 MCG/DOSE AEPB Inhale 1 puff into the lungs daily. 360 each 0   No current facility-administered medications on file prior to visit.    Family History  Problem Relation Age of Onset   Breast cancer Mother 33 - 72       stage IV at diagnosis   Emphysema Mother        smoked   Heart disease Mother    COPD Father        smoked   Asthma Father    Heart disease Father    Cancer Brother        Sinus   Colon cancer Neg Hx    Esophageal cancer Neg Hx    Inflammatory bowel disease Neg Hx    Liver disease Neg Hx    Pancreatic cancer Neg Hx    Rectal cancer Neg Hx    Stomach cancer Neg Hx     Social History   Socioeconomic History   Marital status: Married    Spouse name: Not on file   Number of children: 2   Years of education: Not on file   Highest education level: Not on file  Occupational History   Occupation: Disabled  Tobacco Use   Smoking status: Former    Current packs/day: 0.00    Average packs/day: 1.5 packs/day for 30.0 years (45.0 ttl pk-yrs)    Types: Cigarettes    Start date: 12/29/1970    Quit date: 12/29/2000    Years since quitting: 23.5   Smokeless tobacco: Never  Vaping Use   Vaping status: Never Used  Substance and Sexual Activity   Alcohol use: Not Currently   Drug use: Not Currently    Types: Marijuana    Comment: CBD oils through vape shops   Sexual activity: Not Currently    Birth control/protection: Surgical,  Post-menopausal    Comment: tubal ligation  Other Topics Concern  Not on file  Social History Narrative   Not on file   Social Drivers of Health   Financial Resource Strain: Low Risk  (06/28/2024)   Overall Financial Resource Strain (CARDIA)    Difficulty of Paying Living Expenses: Not hard at all  Food Insecurity: No Food Insecurity (12/04/2023)   Hunger Vital Sign    Worried About Running Out of Food in the Last Year: Never true    Ran Out of Food in the Last Year: Never true  Transportation Needs: No Transportation Needs (09/21/2023)   PRAPARE - Administrator, Civil Service (Medical): No    Lack of Transportation (Non-Medical): No  Physical Activity: Inactive (01/18/2024)   Exercise Vital Sign    Days of Exercise per Week: 0 days    Minutes of Exercise per Session: 0 min  Stress: No Stress Concern Present (03/17/2024)   [employer] of Occupational Health - Occupational Stress Questionnaire    Feeling of Stress : Only a little  Social Connections: Moderately Integrated (06/28/2024)   Social Connection and Isolation Panel    Frequency of Communication with Friends and Family: More than three times a week    Frequency of Social Gatherings with Friends and Family: Twice a week    Attends Religious Services: More than 4 times per year    Active Member of [employer] or Organizations: Not on file    Attends Banker Meetings: Never    Marital Status: Married  Catering manager Violence: Not At Risk (02/18/2024)   Humiliation, Afraid, Rape, and Kick questionnaire    Fear of Current or Ex-Partner: No    Emotionally Abused: No    Physically Abused: No    Sexually Abused: No    Review of Systems: ROS negative except for what is noted on the assessment and plan.  Vitals:   06/28/24 1516 06/28/24 1533 06/28/24 1605  BP: (!) 95/58 98/61 114/66  Pulse: 84 87 80  Temp: 97.9 F (36.6 C)    TempSrc: Oral    SpO2: 94%    Weight: 271 lb 6.4 oz (123.1 kg)     Height: 5' (1.524 m)      Physical Exam: Constitutional: Well-appearing woman, sitting in a wheelchair Cardiovascular: regular rate and rhythm, no m/r/g Pulmonary/Chest: normal work of breathing on room air MSK: Well-healed scar from her left total knee arthroplasty  neurological: alert & oriented Skin: warm and dry Psych: normal mood and behavior  Assessment & Plan:   Acute hip pain, right Ms. Min is a 63 year old morbidly obese patient presenting with right hip pain that appears to have been ongoing for some time. At her last visit, she was advised to undergo an MRI of the hip, which revealed mild asymmetric degenerative changes in the right hip without any acute osseous abnormalities to explain her symptoms. The MRI also noted mild bladder wall thickening with smooth mucosal enhancement, possibly indicative of cystitis; correlation with urinalysis is recommended. Postsurgical changes were observed in the lower lumbar spine, with lumbar spine details documented separately. We reviewed her current pain management, and she reports mild relief with Tylenol . We discussed additional conservative measures, including minimizing weight-bearing on the affected hip as much as possible. She is scheduled for evaluation by an orthopedic surgeon on July 23 for further assessment and potential surgical intervention if indicated. In the meantime, I recommend continuing Tylenol  for pain control. Should she be deemed unsuitable for surgery after the orthopedic evaluation, we will revisit pain management options  to ensure adequate symptom relief. -  Follow-up with orthopedics - Continue Tylenol  as needed for right hip pain - Weightbearing on the right hip as tolerated  Hypertension associated with diabetes (HCC) BP Readings from Last 3 Encounters:  06/28/24 114/66  04/21/24 (!) 117/50  04/20/24 126/74  Patient has a history of hypertension currently on Hyzaar 50-125 mg.  Her blood pressure is at goal  and I do not see any indication for medication changes at this time. - Continue Hyzaar  Type 2 diabetes mellitus without complication, without long-term current use of insulin  (HCC) Lab Results  Component Value Date   HGBA1C 6.1 (A) 06/28/2024   HGBA1C 6.6 (A) 12/01/2023   HGBA1C 7.1 (H) 09/09/2023  Well-controlled on metformin  500 mg twice daily.  Denies any polydipsia or polyuria at this time.   - 500 mg twice daily  Morbid obesity (HCC) Body mass index is 53 kg/m.  Currently on Wegovy  2.4 mg weekly.  Patient endorses good adherence and denies any potential side effect at this time. -Continue Wegovy  2.4 mg weekly   Patient discussed with Dr. CHARLENA Rosan Drue Renne, M.D [facility] Phone: [phone] Date 06/28/2024 Time 5:39 PM

## 2024-06-28 NOTE — Assessment & Plan Note (Signed)
 BP Readings from Last 3 Encounters:  06/28/24 114/66  04/21/24 (!) 117/50  04/20/24 126/74  Patient has a history of hypertension currently on Hyzaar 50-125 mg.  Her blood pressure is at goal and I do not see any indication for medication changes at this time. - Continue Hyzaar

## 2024-06-28 NOTE — Progress Notes (Signed)
 Internal Medicine Clinic Attending  Case discussed with the resident at the time of the visit.  We reviewed the resident's history and exam and pertinent patient test results.  I agree with the assessment, diagnosis, and plan of care documented in the resident's note.

## 2024-06-28 NOTE — Assessment & Plan Note (Addendum)
 Lab Results  Component Value Date   HGBA1C 6.1 (A) 06/28/2024   HGBA1C 6.6 (A) 12/01/2023   HGBA1C 7.1 (H) 09/09/2023  Well-controlled on metformin  500 mg twice daily.  Denies any polydipsia or polyuria at this time.   - 500 mg twice daily

## 2024-06-28 NOTE — Assessment & Plan Note (Signed)
 Ms. Krul is a 63 year old morbidly obese patient presenting with right hip pain that appears to have been ongoing for some time. At her last visit, she was advised to undergo an MRI of the hip, which revealed mild asymmetric degenerative changes in the right hip without any acute osseous abnormalities to explain her symptoms. The MRI also noted mild bladder wall thickening with smooth mucosal enhancement, possibly indicative of cystitis; correlation with urinalysis is recommended. Postsurgical changes were observed in the lower lumbar spine, with lumbar spine details documented separately. We reviewed her current pain management, and she reports mild relief with Tylenol . We discussed additional conservative measures, including minimizing weight-bearing on the affected hip as much as possible. She is scheduled for evaluation by an orthopedic surgeon on July 23 for further assessment and potential surgical intervention if indicated. In the meantime, I recommend continuing Tylenol  for pain control. Should she be deemed unsuitable for surgery after the orthopedic evaluation, we will revisit pain management options to ensure adequate symptom relief. -  Follow-up with orthopedics - Continue Tylenol  as needed for right hip pain - Weightbearing on the right hip as tolerated

## 2024-06-28 NOTE — Assessment & Plan Note (Signed)
 Body mass index is 53 kg/m.  Currently on Wegovy  2.4 mg weekly.  Patient endorses good adherence and denies any potential side effect at this time. -Continue Wegovy  2.4 mg weekly

## 2024-06-28 NOTE — Patient Instructions (Signed)
 Thank you, [name] for allowing us  to provide your care today. Today we discussed your blood pressure, your hip pain and your blood sugar. I am happy to see that your blood sugars are doing okay. For your hip pain, I will continue to try the Tylenol  and to use either orthopedic surgeon on July 23.   Your blood pressure Normalized after you drink 2 cups of water .  Please stay hydrated.  Continue blood pressure medications as it is I would not make any changes to read.  I have ordered the following labs for you:  Lab Orders         Glucose, capillary         POC Hbg A1C      Tests ordered today:    Referrals ordered today:   Referral Orders  No referral(s) requested today     I have ordered the following medication/changed the following medications:   Stop the following medications: There are no discontinued medications.   Start the following medications: No orders of the defined types were placed in this encounter.    Follow up: 2 months     Should you have any questions or concerns please call the internal medicine clinic at [phone].   Drue Lisa Grow MD 06/28/2024, 4:17 PM   [facility]

## 2024-06-29 ENCOUNTER — Other Ambulatory Visit: Payer: Self-pay

## 2024-07-01 NOTE — Patient Instructions (Signed)
 Visit Information  [name] was given information about Medicaid Managed Care team care coordination services as a part of their [facility] Medicaid benefit. [name] verbally consented to engagement with the [facility] Managed Care team.   If you are experiencing a medical emergency, please call 911 or report to your local emergency department or urgent care.   If you have a non-emergency medical problem during routine business hours, please contact your provider's office and ask to speak with a nurse.   For questions related to your [facility] health plan, please call: [phone] or go here:[email or web address]  If you would like to schedule transportation through your [facility] plan, please call the following number at least 2 days in advance of your appointment: [phone].   You can also use the MTM portal or MTM mobile app to manage your rides. Reimbursement for transportation is available through [facility]! For the portal, please go to [email or web address]  Call the [facility] Crisis Line at [phone], at any time, 24 hours a day, 7 days a week. If you are in danger or need immediate medical attention call 911.  If you would like help to quit smoking, call 1-800-QUIT-NOW ((910) 829-4614) OR Espaol: 1-855-Djelo-Ya (8-144-664-6430) o para ms informacin haga clic aqu or Text READY to 799-599 to register via text  Ms. [name] - following are the goals we discussed in your visit today:   Goals Addressed             This Visit's Progress    VBCI RN Care Plan- Right hip pain       Problems:  Chronic Disease Management support and education needs related to Right Hip Pain  Goal: Over the next 30 days the Patient will attend all scheduled medical appointments: with PCP and specialist as evidenced by keeping al scheduled appointments        demonstrate Ongoing adherence to prescribed treatment plan for Right Hip Pain as evidenced by no  admissions to the hospital verbalize basic understanding of Right Hip Pain disease process and self health management plan as evidenced by verbal explanation lifestyle changes and consistent medication compliance   Interventions:   Pain Interventions: Pain assessment performed Medications reviewed Reviewed provider established plan for pain management Discussed importance of adherence to all scheduled medical appointments Counseled on the importance of reporting any/all new or changed pain symptoms or management strategies to pain management provider Advised patient to report to care team affect of pain on daily activities Discussed use of relaxation techniques and/or diversional activities to assist with pain reduction (distraction, imagery, relaxation, massage, acupressure, TENS, heat, and cold application Reviewed with patient prescribed pharmacological and nonpharmacological pain relief strategies Screening for signs and symptoms of depression related to chronic disease state  Assessed social determinant of health barriers Attend your scheduled Ortho appointment on the 23rd of this month   Patient Self-Care Activities:  Attend all scheduled provider appointments Call pharmacy for medication refills 3-7 days in advance of running out of medications Call provider office for new concerns or questions  Perform all self care activities independently  Perform IADL's (shopping, preparing meals, housekeeping, managing finances) independently Take medications as prescribed    Plan:  Telephone follow up appointment with care management team member scheduled for:  08/01/24  2 pm             Please see education materials related to Right hip pain provided by MyChart link.  Patient verbalizes understanding of instructions and care plan provided today and  agrees to view in [place]. Active MyChart status and patient understanding of how to access instructions and care plan via MyChart  confirmed with patient.     Telephone follow up appointment date/time:  08/01/24  2 pm  Wilbert Diver RN, BSN, [facility] [place]  [facility], [facility]    Care Coordinator Phone: [phone]      Following is a copy of your plan of care:  There are no care plans that you recently modified to display for this patient.

## 2024-07-01 NOTE — Patient Outreach (Signed)
 Complex Care Management   Visit Note  06/29/2024  Name:  [name] MRN: [id number] DOB: 1961-07-10  Situation: Referral received for Complex Care Management related to Right hip pain I obtained verbal consent from Patient.  Visit completed with patient  on the phone  Background:   Past Medical History:  Diagnosis Date   Anemia    Anxiety    Aortic valve stenosis, severe    Arthritis    PAIN AND SEVERE OA LEFT KNEE ; S/P RIGHT TKA ON 02/03/12; HAS LOWER BACK PAIN-UNABLE TO STAND MORE THAN 10 MIN; ARTHRITIS ALL OVER   Asthma    Blood transfusion    2013[facility]   Breast cancer in female [facility])    Right   CAD (coronary artery disease)    Cath 2010 with DES x 1 RCA-- PT'S CARDIOLOGIST IS DR. MCALHANY   Chronic diastolic congestive heart failure (HCC)    COPD (chronic obstructive pulmonary disease) (HCC)    Mild   COPD exacerbation (HCC) 11/16/2022   Depression    Diabetes mellitus DIAGNOSED IN2010   Dyspnea    with much ambulation   Eczema    on back   Headache    migraines younger- rare now 02/07/21   Heart murmur    no current problems   History of hiatal hernia    History of kidney stones    passed or blasted   History of UTI 12/05/2022   Hyperlipidemia    Hypertension    Morbid obesity with body mass index of 60.0-69.9 in adult [facility])    Myocardial infarction (HCC)    PT THINKS SHE WAS DX WITH MI AT THE TIME OF HEART STENTING   Neuromuscular disorder (HCC)    bilateral arm/hands   Oxygen dependent    uses 3L oxygen night/prn   Personal history of radiation therapy    Pneumonia    Pulmonary embolism (HCC) 02/08/2012   S/P RT TOTAL KNEE ON 02/03/12--ON 02/08/12--DEVELOPED ACUTE SOB AND CHEST PAIN--AND DIAGNOSED WITH  PULMONARY EMBOLUS AND PNEUMONIA   Restless leg syndrome    Sleep apnea    Uterine fibroid    NO PROBLEMS AT PRESENT FROM THE FIBROIDS-STATES SHE IS POST MENOPAUSAL-LAST MENSES 2010 EXCEPT FOR EPISODE THIS YR OF BLEEDING RELATED TO FIBROIDS.    Weakness    BOTH HANDS - S/P BILATERAL CARPAL TUNNEL RELEASE--BUT STILL HAS WEAKNESS--OFTEN DROPS THINGS    Assessment: Patient Reported Symptoms:  Cognitive Cognitive Status: Able to follow simple commands, Alert and oriented to person, place, and time, Confused or disoriented, Insightful and able to interpret abstract concepts, Normal speech and language skills      Neurological Neurological Review of Symptoms: No symptoms reported    HEENT HEENT Symptoms Reported: No symptoms reported      Cardiovascular Cardiovascular Symptoms Reported: No symptoms reported Does patient have uncontrolled Hypertension?: No Cardiovascular Management Strategies: Medication therapy  Respiratory Respiratory Symptoms Reported: No symptoms reported Additional Respiratory Details: She uses trilegy and inhaleras    Endocrine Endocrine Symptoms Reported: No symptoms reported Is patient diabetic?: Yes Is patient checking blood sugars at home?: No    Gastrointestinal Gastrointestinal Symptoms Reported: Constipation, Flatulence, Diarrhea Gastrointestinal Management Strategies: Diet modification, Medication therapy    Genitourinary Genitourinary Symptoms Reported: Incontinence Genitourinary Management Strategies: Incontinence garment/pad  Integumentary Additional Integumentary Details: Crepe and dry skin Skin Management Strategies: Medication therapy Skin Comment: use lotion for dry skin  Musculoskeletal Musculoskelatal Symptoms Reviewed: Difficulty walking Musculoskeletal Management Strategies: Medical device, Medication therapy  Falls in the past year?: No    Psychosocial              06/29/2024    3:21 PM  Depression screen PHQ 2/9  Decreased Interest 0  Down, Depressed, Hopeless 0  PHQ - 2 Score 0    There were no vitals filed for this visit.  Medications Reviewed Today   Medications were not reviewed in this encounter     Recommendation:   PCP Follow-up Specialty provider follow-up  Ortho  on 07/20/24  Follow Up Plan:   Telephone follow up appointment date/time:  08/01/24  2 pm  Wilbert Diver RN, BSN, [facility]  [facility], [facility]    Care Coordinator Phone: [phone]

## 2024-07-04 ENCOUNTER — Telehealth: Payer: Self-pay | Admitting: *Deleted

## 2024-07-04 ENCOUNTER — Telehealth: Payer: Self-pay

## 2024-07-04 NOTE — Telephone Encounter (Signed)
 Pharmacy Patient Advocate Encounter  Received notification from [facility] Medicaid that Prior Authorization for WEGOVY  2.4MG  has been APPROVED from 07/04/24 to 07/04/25

## 2024-07-04 NOTE — Telephone Encounter (Unsigned)
 Copied from CRM [id number]. Topic: Clinical - Medication Prior Auth >> Jul 04, 2024 11:45 AM Mercer PEDLAR wrote: Reason for CRM: Patient called stating that she was told by her pharmacy that a prior auth is needed for WEGOVY  2.4 MG/0.75ML SOAJ because it is currently not covered by insurance.

## 2024-07-04 NOTE — Telephone Encounter (Signed)
 Pharmacy Patient Advocate Encounter   Received notification from CoverMyMeds that prior authorization for WEGOVY  2.4MG  is required/requested.   Insurance verification completed.   The patient is insured through [employer] [place] .   PA required; PA submitted to above mentioned insurance via CoverMyMeds Key/confirmation #/EOC [id number]. Status is pending

## 2024-07-05 NOTE — Telephone Encounter (Signed)
 Telephone 07/04/2024 [facility] - A Dept Of [place]    Everette, Lavern SAILOR, CPhT Pharmacy Technician Prior Auth  Reason for conversation   All Conversations: Prior Auth (Newest Message First)            Rosina Lavern SAILOR Bishop    07/04/24  2:47 PM Note Pharmacy Patient Advocate Encounter   Received notification from [facility] Medicaid that Prior Authorization for WEGOVY  2.4MG  has been APPROVED from 07/04/24 to 07/04/25         Rosina Lavern SAILOR, CPhT    07/04/24 10:21 AM Note Pharmacy Patient Advocate Encounter   Received notification from CoverMyMeds that prior authorization for WEGOVY  2.4MG  is required/requested.   Insurance verification completed.   The patient is insured through [facility] [place] .   PA required; PA submitted to above mentioned insurance via CoverMyMeds Key/confirmation #/EOC [id number]. Status is pending

## 2024-07-09 DIAGNOSIS — Z419 Encounter for procedure for purposes other than remedying health state, unspecified: Secondary | ICD-10-CM | POA: Diagnosis not present

## 2024-07-12 ENCOUNTER — Other Ambulatory Visit: Payer: Self-pay

## 2024-07-12 MED ORDER — TRELEGY ELLIPTA 100-62.5-25 MCG/ACT IN AEPB: 1.0000 | INHALATION_SPRAY | Freq: Every day | RESPIRATORY_TRACT | 3 refills | Status: DC

## 2024-07-20 ENCOUNTER — Other Ambulatory Visit (INDEPENDENT_AMBULATORY_CARE_PROVIDER_SITE_OTHER): Payer: Self-pay

## 2024-07-20 ENCOUNTER — Ambulatory Visit: Admitting: Orthopedic Surgery

## 2024-07-20 ENCOUNTER — Other Ambulatory Visit: Payer: Self-pay

## 2024-07-20 DIAGNOSIS — G8929 Other chronic pain: Secondary | ICD-10-CM | POA: Diagnosis not present

## 2024-07-20 DIAGNOSIS — M5441 Lumbago with sciatica, right side: Secondary | ICD-10-CM

## 2024-07-20 DIAGNOSIS — M5442 Lumbago with sciatica, left side: Secondary | ICD-10-CM

## 2024-07-20 DIAGNOSIS — M5416 Radiculopathy, lumbar region: Secondary | ICD-10-CM | POA: Diagnosis not present

## 2024-07-20 NOTE — Telephone Encounter (Unsigned)
 Copied from CRM [id number]. Topic: Clinical - Medication Refill >> Jul 20, 2024  4:36 PM Adrianna P wrote: Medication: WEGOVY  2.4 MG/0.75ML SOAJ [[id number]]  Has the patient contacted their pharmacy? Yes (Agent: If no, request that the patient contact the pharmacy for the refill. If patient does not wish to contact the pharmacy document the reason why and proceed with request.) (Agent: If yes, when and what did the pharmacy advise?)  This is the patient's preferred pharmacy:  [facility] [address] [place], [place] - [address] [place] [address] Phone: [phone] Fax: [phone]  [facility] - [place], [place] - [address] [place] [address] Phone: [phone] Fax: [phone]  Is this the correct pharmacy for this prescription? Yes If no, delete pharmacy and type the correct one.   Has the prescription been filled recently? No  Is the patient out of the medication? Yes  Has the patient been seen for an appointment in the last year OR does the patient have an upcoming appointment? Yes  Can we respond through MyChart? Yes  Agent: Please be advised that Rx refills may take up to 3 business days. We ask that you follow-up with your pharmacy.

## 2024-07-20 NOTE — Progress Notes (Signed)
 Orthopedic Spine Surgery Office Note  Assessment: Patient is a 63 y.o. female with low back pain that radiates into bilateral lateral thighs and anterior legs (R>L).  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 Discussed that the conservative treatments include:  -activity modification  -physical therapy  -over the counter pain medications  -medrol  dosepak  -lumbar steroid injections -Patient has tried Tylenol , PT, gabapentin  -Recommended diagnostic/therapeutic injection at L3/4 -Patient is not currently a candidate for surgery given her BMI greater than 50.  Gave her a weight goal of 220 pounds -Patient should return to office on an as-needed basis   Patient expressed understanding of the plan and all questions were answered to the patient's satisfaction.   ___________________________________________________________________________   History:  Patient is a 63 y.o. female who presents today for lumbar spine.  Patient has a long history of back pain and has previously undergone L4/5 decompression fusion.  She notes improvement after the surgery but still had back pain.  Within the last 6 months though, pain has gotten progressively worse.  She feels the pain starting in her back and radiating into her bilateral lower extremities.  She feels the going along the lateral aspect of her thighs into the anterior legs.  She said that her right side is more symptomatic than her left.  She notes the pain with activity and rest.  There was no trauma or injury that preceded the onset of her worsening pain.   Weakness: Denies Symptoms of imbalance: Denies Paresthesias and numbness: Yes, has numbness and paresthesias in her feet.  No recent changes Bowel or bladder incontinence: Yes, sometimes has both urinary and bowel incontinence.  She has had a long  history of urinary issues.  No recent changes in her bowel or bladder habits Saddle anesthesia: Denies  Treatments tried: Tylenol , PT, gabapentin   Review of systems: Denies fevers and chills, night sweats, unexplained weight loss, history of cancer.  Has had pain that wakes her at night  Past medical history: Depression/anxiety COPD Breast cancer history DM (last A1c was 6.1 on 06/28/2024) Aortic valve stenosis HLD HTN History of MI OSA History of PE  Allergies: NKDA  Past surgical history:  L4/5 decompression and PSIF Breast lumpectomy Bilateral carpal tunnel release Right biceps tenodesis Cholecystectomy Coronary angioplasty Bilateral shoulder arthroplasty Ureter stent placement Bilateral TKA Trigger finger release Revision TKA Aortic valve replacement  Social history: Denies use of nicotine product (smoking, vaping, patches, smokeless) Alcohol use: denies Denies recreational drug use   Physical Exam:  BMI of 52.7  General: no acute distress, appears stated age Neurologic: alert, answering questions appropriately, following commands Respiratory: unlabored breathing on room air, symmetric chest rise Psychiatric: appropriate affect, normal cadence to speech   MSK (spine):  -Strength exam      Left  Right EHL    5/5  5/5 TA    5/5  5/5 GSC    5/5  5/5 Knee extension  5/5  5/5 Hip flexion   5/5  5/5  -Sensory exam    Sensation intact to light touch in L3-S1 nerve distributions of bilateral lower extremities  -Straight leg raise: Negative bilaterally -Clonus: no beats bilaterally  -Left hip exam: No pain through range of motion -Right hip exam: No pain through range of motion  Imaging: XRs of the lumbar spine from 07/20/2024 were independently reviewed and interpreted, showing interbody device at  L4/5.  No lucency seen around the interbody device.  Interbody device appears contained within the former disc space.  Posterior instrumentation at L4 and  L5.  No lucency seen around the screws.  Neither the screws backed out.  Disc height loss at L5/S1.  No other significant degenerative changes seen.  No evidence of instability on flexion/extension views.  No fracture or dislocation seen.  MRI of the lumbar spine from 05/29/2024 was independently reviewed and interpreted, showing DDD with posterior disc bulge causing central stenosis at T12/L1.  Central stenosis at L3/4.  No other significant stenosis seen.  Prior instrumentation at L4 and L5 without evidence of complication though MRI is not the best modality to determine.   Patient name: [name] Patient MRN: [id number] Date of visit: 07/20/24

## 2024-07-21 MED ORDER — WEGOVY 2.4 MG/0.75ML ~~LOC~~ SOAJ: 2.4000 mg | SUBCUTANEOUS | 0 refills | Status: DC

## 2024-07-29 ENCOUNTER — Telehealth (HOSPITAL_COMMUNITY): Admitting: Physician Assistant

## 2024-07-29 ENCOUNTER — Ambulatory Visit: Admission: EM | Admit: 2024-07-29 | Discharge: 2024-07-29 | Disposition: A | Discharge status: Home / Self Care

## 2024-07-29 ENCOUNTER — Encounter (HOSPITAL_COMMUNITY): Payer: Self-pay | Admitting: Physician Assistant

## 2024-07-29 DIAGNOSIS — F32A Depression, unspecified: Secondary | ICD-10-CM | POA: Diagnosis not present

## 2024-07-29 DIAGNOSIS — W540XXA Bitten by dog, initial encounter: Secondary | ICD-10-CM | POA: Diagnosis not present

## 2024-07-29 DIAGNOSIS — F4310 Post-traumatic stress disorder, unspecified: Secondary | ICD-10-CM

## 2024-07-29 DIAGNOSIS — S61452A Open bite of left hand, initial encounter: Secondary | ICD-10-CM

## 2024-07-29 DIAGNOSIS — F411 Generalized anxiety disorder: Secondary | ICD-10-CM | POA: Diagnosis not present

## 2024-07-29 DIAGNOSIS — L03114 Cellulitis of left upper limb: Secondary | ICD-10-CM | POA: Diagnosis not present

## 2024-07-29 MED ORDER — AMOXICILLIN-POT CLAVULANATE 875-125 MG PO TABS: 1.0000 | ORAL_TABLET | Freq: Two times a day (BID) | ORAL | 0 refills | Status: DC

## 2024-07-29 MED ORDER — HYDROXYZINE HCL 50 MG PO TABS: 50.0000 mg | ORAL_TABLET | Freq: Every evening | ORAL | 3 refills | Status: DC | PRN

## 2024-07-29 MED ORDER — DULOXETINE HCL 30 MG PO CPEP: 30.0000 mg | ORAL_CAPSULE | Freq: Every day | ORAL | 3 refills | Status: DC

## 2024-07-29 MED ORDER — BUSPIRONE HCL 15 MG PO TABS: 15.0000 mg | ORAL_TABLET | Freq: Two times a day (BID) | ORAL | 3 refills | Status: DC

## 2024-07-29 MED ORDER — TRAZODONE HCL 150 MG PO TABS: 150.0000 mg | ORAL_TABLET | Freq: Every day | ORAL | 3 refills | Status: DC

## 2024-07-29 NOTE — Progress Notes (Signed)
 BH MD/PA/NP OP Progress Note  Virtual Visit via Video Note  I connected with [name] on 07/29/24 at  1:30 PM EDT by a video enabled telemedicine application and verified that I am speaking with the correct person using two identifiers.  Location: Patient: Home Provider: Clinic   I discussed the limitations of evaluation and management by telemedicine and the availability of in person appointments. The patient expressed understanding and agreed to proceed.  Follow Up Instructions:   I discussed the assessment and treatment plan with the patient. The patient was provided an opportunity to ask questions and all were answered. The patient agreed with the plan and demonstrated an understanding of the instructions.   The patient was advised to call back or seek an in-person evaluation if the symptoms worsen or if the condition fails to improve as anticipated.  I provided 15 minutes of non-face-to-face time during this encounter.  [name], PA    07/29/2024 2:22 PM [name]  MRN:  [id number]  Chief Complaint:  Chief Complaint  Patient presents with   Follow-up   Medication Refill   HPI:   [name] is a 63 year old female with a past psychiatric history significant for generalized anxiety disorder, PTSD, mild depression who presents to [facility] via virtual video picked up for follow-up and medication management.  Patient was last seen by Dr. Harl on 05/11/2024.  During her last encounter, patient was being managed on the following psychiatric medications:  Buspirone  15 mg 2 times daily Trazodone  150 mg at bedtime as needed Duloxetine  30 mg daily Hydroxyzine  50 mg at bedtime as needed  Patient reports no issues or concerns regarding her current medication regimen.  In regards to her sleep, patient reports that she receives roughly 8 hours of sleep per night.  She does report that it may take her a  while to fall asleep causing her to wake up later the next day.  Despite this issue, patient reports that her sleep has been going generally well.  Patient endorses minimal depression and attributes her depression to the recent passing of her sister-in-law.  Patient rates her depression a 2-3 out of 10 with 10 being most severe.  Patient endorses depressive episodes 2 days/week.  Patient endorses the following depressive symptoms: feelings of sadness, lack of motivation, and decreased energy.  Patient denies crying spells, feelings of guilt/worthlessness, or hopelessness.  Patient denies anxiety.  A PHQ-9 screen was performed with the patient scoring a 3.  A GAD-7 screen was also performed with the patient scoring a 2.  Patient is alert and oriented x 4, calm, cooperative, and fully engaged in conversation during the encounter.  Patient endorses good mood.  Patient exhibits euthymic mood with appropriate affect.  Patient denies suicidal or homicidal ideations.  She further denies auditory or visual hallucinations and does not appear to be responding to internal/external stimuli.  Patient endorses good sleep and receives on average 8 hours of sleep per night.  Patient endorses fair appetite and eats on average 2 meals per day.  Patient denies alcohol consumption, tobacco use, or illicit drug use.  Visit Diagnosis:    ICD-10-CM   1. GAD (generalized anxiety disorder)  F41.1 busPIRone  (BUSPAR ) 15 MG tablet    traZODone  (DESYREL ) 150 MG tablet    DULoxetine  (CYMBALTA ) 30 MG capsule    hydrOXYzine  (ATARAX ) 50 MG tablet    2. PTSD (post-traumatic stress disorder)  F43.10 busPIRone  (BUSPAR ) 15 MG  tablet    traZODone  (DESYREL ) 150 MG tablet    DULoxetine  (CYMBALTA ) 30 MG capsule    3. Mild depression  F32.A busPIRone  (BUSPAR ) 15 MG tablet    traZODone  (DESYREL ) 150 MG tablet    DULoxetine  (CYMBALTA ) 30 MG capsule    hydrOXYzine  (ATARAX ) 50 MG tablet      Past Psychiatric History:   Depression Anxiety PTSD  Past Medical History:  Past Medical History:  Diagnosis Date   Anemia    Anxiety    Aortic valve stenosis, severe    Arthritis    PAIN AND SEVERE OA LEFT KNEE ; S/P RIGHT TKA ON 02/03/12; HAS LOWER BACK PAIN-UNABLE TO STAND MORE THAN 10 MIN; ARTHRITIS ALL OVER   Asthma    Blood transfusion    2013[facility]   Breast cancer in female [facility])    Right   CAD (coronary artery disease)    Cath 2010 with DES x 1 RCA-- PT'S CARDIOLOGIST IS DR. MCALHANY   Chronic diastolic congestive heart failure (HCC)    COPD (chronic obstructive pulmonary disease) (HCC)    Mild   COPD exacerbation (HCC) 11/16/2022   Depression    Diabetes mellitus DIAGNOSED IN2010   Dyspnea    with much ambulation   Eczema    on back   Headache    migraines younger- rare now 02/07/21   Heart murmur    no current problems   History of hiatal hernia    History of kidney stones    passed or blasted   History of UTI 12/05/2022   Hyperlipidemia    Hypertension    Morbid obesity with body mass index of 60.0-69.9 in adult [facility])    Myocardial infarction (HCC)    PT THINKS SHE WAS DX WITH MI AT THE TIME OF HEART STENTING   Neuromuscular disorder (HCC)    bilateral arm/hands   Oxygen dependent    uses 3L oxygen night/prn   Personal history of radiation therapy    Pneumonia    Pulmonary embolism (HCC) 02/08/2012   S/P RT TOTAL KNEE ON 02/03/12--ON 02/08/12--DEVELOPED ACUTE SOB AND CHEST PAIN--AND DIAGNOSED WITH  PULMONARY EMBOLUS AND PNEUMONIA   Restless leg syndrome    Sleep apnea    Uterine fibroid    NO PROBLEMS AT PRESENT FROM THE FIBROIDS-STATES SHE IS POST MENOPAUSAL-LAST MENSES 2010 EXCEPT FOR EPISODE THIS YR OF BLEEDING RELATED TO FIBROIDS.   Weakness    BOTH HANDS - S/P BILATERAL CARPAL TUNNEL RELEASE--BUT STILL HAS WEAKNESS--OFTEN DROPS THINGS    Past Surgical History:  Procedure Laterality Date   BACK SURGERY  02/11/2021   Dr Lucilla   BICEPT TENODESIS Right 09/15/2023    Procedure: BICEPS TENODESIS;  Surgeon: Addie Cordella Hamilton, MD;  Location[facility] OR;  Service: Orthopedics;  Laterality: Right;   BREAST BIOPSY Right 06/04/2018   BREAST LUMPECTOMY Right 06/2018   BREAST LUMPECTOMY WITH RADIOACTIVE SEED AND SENTINEL LYMPH NODE BIOPSY Right 07/19/2018   Procedure: BREAST LUMPECTOMY WITH RADIOACTIVE SEED AND SENTINEL LYMPH NODE BIOPSY;  Surgeon: Ethyl Lenis, MD;  Location: [facility] OR;  Service: General;  Laterality: Right;   CARPAL TUNNEL RELEASE     Bilateral   CHOLECYSTECTOMY     COLONOSCOPY  2010   CORONARY ANGIOPLASTY     2010 has stent in place   CYSTOSCOPY W/ RETROGRADES Right 09/21/2013   Procedure: CYSTOSCOPY WITH RIGHT RETROGRADE PYELOGRAM RIGHT DOUBLE J STENT ;  Surgeon: Donnice Gwenyth Brooks, MD;  Location: [facility] ORS;  Service: Urology;  Laterality: Right;   CYSTOSCOPY W/ URETERAL STENT PLACEMENT Right 11/16/2022   Procedure: CYSTOSCOPY WITH RETROGRADE PYELOGRAM/URETERAL STENT PLACEMENT;  Surgeon: Lovie Arlyss CROME, MD;  Location: [facility] OR;  Service: Urology;  Laterality: Right;   CYSTOSCOPY WITH RETROGRADE PYELOGRAM, URETEROSCOPY AND STENT PLACEMENT Right 01/13/2023   Procedure: CYSTOSCOPY WITH RETROGRADE PYELOGRAM, URETEROSCOPY AND STENT EXCHANGE;  Surgeon: Roseann Adine PARAS., MD;  Location: [facility] ORS;  Service: Urology;  Laterality: Right;   CYSTOSCOPY WITH URETEROSCOPY AND STENT PLACEMENT Right 10/25/2013   Procedure: CYSTOSCOPY RIGHT URETEROSCOPY HOLMIUM LASER LITHO AND STENT PLACEMENT;  Surgeon: Donnice Gwenyth Brooks, MD;  Location: [facility];  Service: Urology;  Laterality: Right;   HERNIA REPAIR  1995   umbilical ,   HOLMIUM LASER APPLICATION Right 01/13/2023   Procedure: HOLMIUM LASER APPLICATION;  Surgeon: Roseann Adine PARAS., MD;  Location: [facility]S;  Service: Urology;  Laterality: Right;   INTRAOPERATIVE TRANSESOPHAGEAL ECHOCARDIOGRAM N/A 12/12/2014   Procedure: INTRAOPERATIVE TRANSESOPHAGEAL ECHOCARDIOGRAM;  Surgeon: Lonni JONETTA Cash, MD;   Location: [facility] OR;  Service: Cardiovascular;  Laterality: N/A;   KNEE ARTHROPLASTY  02/03/2012   Procedure: COMPUTER ASSISTED TOTAL KNEE ARTHROPLASTY;  Surgeon: Lonni CINDERELLA Poli, MD;  Location: [facility] OR;  Service: Orthopedics;  Laterality: Right;  Right total knee arthroplasty   LEFT AND RIGHT HEART CATHETERIZATION WITH CORONARY ANGIOGRAM N/A 03/17/2013   Procedure: LEFT AND RIGHT HEART CATHETERIZATION WITH CORONARY ANGIOGRAM;  Surgeon: Lonni JONETTA Cash, MD;  Location: [facility] CATH LAB;  Service: Cardiovascular;  Laterality: N/A;   LEFT AND RIGHT HEART CATHETERIZATION WITH CORONARY/GRAFT ANGIOGRAM N/A 09/14/2014   Procedure: LEFT AND RIGHT HEART CATHETERIZATION WITH EL BILE;  Surgeon: Lonni JONETTA Cash, MD;  Location: [facility] CATH LAB;  Service: Cardiovascular;  Laterality: N/A;   REVERSE SHOULDER ARTHROPLASTY Left 03/06/2022   Procedure: LEFT SHOULDER REPLACEMENT APPLICATION OF WOUND VAC;  Surgeon: Addie Cordella Hamilton, MD;  Location: [facility] OR;  Service: Orthopedics;  Laterality: Left;   REVERSE SHOULDER ARTHROPLASTY Right 09/15/2023   Procedure: REVERSE SHOULDER ARTHROPLASTY;  Surgeon: Addie Cordella Hamilton, MD;  Location: [facility] OR;  Service: Orthopedics;  Laterality: Right;   TEE WITHOUT CARDIOVERSION N/A 03/14/2013   Procedure: TRANSESOPHAGEAL ECHOCARDIOGRAM (TEE);  Surgeon: Redell GORMAN Shallow, MD;  Location: [facility] ENDOSCOPY;  Service: Cardiovascular;  Laterality: N/A;   TEE WITHOUT CARDIOVERSION N/A 11/14/2014   Procedure: TRANSESOPHAGEAL ECHOCARDIOGRAM (TEE);  Surgeon: Redell GORMAN Shallow, MD;  Location: [facility] ENDOSCOPY;  Service: Cardiovascular;  Laterality: N/A;   TONSILLECTOMY     maybe as a child- does not know   TOTAL KNEE ARTHROPLASTY  09/10/2012   Procedure: TOTAL KNEE ARTHROPLASTY;  Surgeon: Lonni CINDERELLA Poli, MD;  Location: [facility]S;  Service: Orthopedics;  Laterality: Left;   TOTAL KNEE REVISION Right 07/15/2013   Procedure: REVISION ARTHROPLASTY RIGHT KNEE;  Surgeon: Lonni CINDERELLA Poli, MD;  Location: [facility];  Service: Orthopedics;  Laterality: Right;   TRANSCATHETER AORTIC VALVE REPLACEMENT, TRANSFEMORAL N/A 12/12/2014   Procedure: TRANSCATHETER AORTIC VALVE REPLACEMENT, TRANSFEMORAL;  Surgeon: Lonni JONETTA Cash, MD;  Location: [facility] OR;  Service: Cardiovascular;  Laterality: N/A;   TRIGGER FINGER RELEASE  09/10/2012   Procedure: RELEASE TRIGGER FINGER/A-1 PULLEY;  Surgeon: Lonni CINDERELLA Poli, MD;  Location: [facility] ORS;  Service: Orthopedics;  Laterality: Right;  Right Ring Finger   TUBAL LIGATION      Family Psychiatric History:  Mother - alcohol use  Brother - intellectual delay   Family History:  Family History  Problem Relation Age of Onset   Breast cancer Mother 62 -  65       stage IV at diagnosis   Emphysema Mother        smoked   Heart disease Mother    COPD Father        smoked   Asthma Father    Heart disease Father    Cancer Brother        Sinus   Colon cancer Neg Hx    Esophageal cancer Neg Hx    Inflammatory bowel disease Neg Hx    Liver disease Neg Hx    Pancreatic cancer Neg Hx    Rectal cancer Neg Hx    Stomach cancer Neg Hx     Social History:  Social History   Socioeconomic History   Marital status: Married    Spouse name: Not on file   Number of children: 2   Years of education: Not on file   Highest education level: Not on file  Occupational History   Occupation: Disabled  Tobacco Use   Smoking status: Former    Current packs/day: 0.00    Average packs/day: 1.5 packs/day for 30.0 years (45.0 ttl pk-yrs)    Types: Cigarettes    Start date: 12/29/1970    Quit date: 12/29/2000    Years since quitting: 23.5   Smokeless tobacco: Never  Vaping Use   Vaping status: Never Used  Substance and Sexual Activity   Alcohol use: Not Currently   Drug use: Not Currently    Types: Marijuana    Comment: CBD oils through vape shops   Sexual activity: Not Currently    Birth control/protection: Surgical, Post-menopausal     Comment: tubal ligation  Other Topics Concern   Not on file  Social History Narrative   Not on file   Social Drivers of Health   Financial Resource Strain: Low Risk  (06/28/2024)   Overall Financial Resource Strain (CARDIA)    Difficulty of Paying Living Expenses: Not hard at all  Food Insecurity: No Food Insecurity (06/29/2024)   Hunger Vital Sign    Worried About Running Out of Food in the Last Year: Never true    Ran Out of Food in the Last Year: Never true  Transportation Needs: No Transportation Needs (06/29/2024)   PRAPARE - Administrator, Civil Service (Medical): No    Lack of Transportation (Non-Medical): No  Physical Activity: Inactive (01/18/2024)   Exercise Vital Sign    Days of Exercise per Week: 0 days    Minutes of Exercise per Session: 0 min  Stress: No Stress Concern Present (03/17/2024)   [employer] of Occupational Health - Occupational Stress Questionnaire    Feeling of Stress : Only a little  Social Connections: Moderately Integrated (06/28/2024)   Social Connection and Isolation Panel    Frequency of Communication with Friends and Family: More than three times a week    Frequency of Social Gatherings with Friends and Family: Twice a week    Attends Religious Services: More than 4 times per year    Active Member of Clubs or Organizations: Not on file    Attends Banker Meetings: Never    Marital Status: Married    Allergies: No Known Allergies  Metabolic Disorder Labs: Lab Results  Component Value Date   HGBA1C 6.1 (A) 06/28/2024   MPG 157 09/09/2023   MPG 143 01/08/2023   No results found for: PROLACTIN Lab Results  Component Value Date   CHOL 165 12/25/2022   TRIG 78 12/25/2022  HDL 49 12/25/2022   CHOLHDL 3.4 12/25/2022   VLDL 20 12/28/2015   LDLCALC 101 (H) 12/25/2022   LDLCALC 87 06/04/2022   Lab Results  Component Value Date   TSH 2.820 09/25/2020   TSH 2.918 09/21/2013    Therapeutic Level Labs: No  results found for: LITHIUM No results found for: VALPROATE No results found for: CBMZ  Current Medications: Current Outpatient Medications  Medication Sig Dispense Refill   acetaminophen  (TYLENOL ) 500 MG tablet Take 1,000 mg by mouth every 6 (six) hours as needed for moderate pain.     amLODipine  (NORVASC ) 5 MG tablet Take 1 tablet (5 mg total) by mouth daily. 90 tablet 2   aspirin  EC 81 MG tablet Take 1 tablet (81 mg total) by mouth daily. Swallow whole. 30 tablet 12   atorvastatin  (LIPITOR ) 80 MG tablet Take 1 tablet (80 mg total) by mouth daily. 90 tablet 3   Blood Pressure Monitoring (BLOOD PRESSURE CUFF) MISC Please follow insert instructions. 1 each 0   busPIRone  (BUSPAR ) 15 MG tablet Take 1 tablet (15 mg total) by mouth 2 (two) times daily. 60 tablet 3   clopidogrel  (PLAVIX ) 75 MG tablet Take 1 tablet (75 mg total) by mouth every morning. 90 tablet 3   DULoxetine  (CYMBALTA ) 30 MG capsule Take 1 capsule (30 mg total) by mouth daily. 30 capsule 3   Evolocumab  (REPATHA  SURECLICK) 140 MG/ML SOAJ Inject 140 mg into the skin every 14 (fourteen) days. 6 mL 3   ezetimibe  (ZETIA ) 10 MG tablet Take 1 tablet by mouth once daily 90 tablet 3   Fluticasone -Umeclidin-Vilant (TRELEGY ELLIPTA ) 100-62.5-25 MCG/ACT AEPB Inhale 1 puff into the lungs daily. 28 each 3   gabapentin  (NEURONTIN ) 300 MG capsule TAKE 1 CAPSULE BY MOUTH IN THE MORNING, 1 CAPSULE IN THE AFTERNOON AND 3 CAPSULES AT BEDTIME 150 capsule 0   hydrocortisone  cream 1 % Apply 1 Application topically daily as needed for itching.     hydrOXYzine  (ATARAX ) 50 MG tablet Take 1 tablet (50 mg total) by mouth at bedtime as needed. 30 tablet 3   losartan -hydrochlorothiazide  (HYZAAR) 50-12.5 MG tablet Take 1 tablet by mouth daily. 90 tablet 3   metFORMIN  (GLUCOPHAGE -XR) 500 MG 24 hr tablet Take 1 tablet by mouth twice daily 180 tablet 0   metoprolol  succinate (TOPROL -XL) 50 MG 24 hr tablet Take 1 tablet (50 mg total) by mouth daily. Take with  or immediately following a meal.TAKE 1 TABLET BY MOUTH ONCE DAILY WITH MEALS OR  IMMEDIATELY  FOLLOWING  A  MEAL 90 tablet 2   montelukast  (SINGULAIR ) 10 MG tablet TAKE 1 TABLET BY MOUTH AT BEDTIME 90 tablet 0   Semaglutide -Weight Management (WEGOVY ) 2.4 MG/0.75ML SOAJ Inject 2.4 mg into the skin once a week. 4 mL 0   traZODone  (DESYREL ) 150 MG tablet Take 1 tablet (150 mg total) by mouth at bedtime. 30 tablet 3   VENTOLIN  HFA 108 (90 Base) MCG/ACT inhaler Inhale 2 puffs into the lungs every 6 (six) hours as needed for wheezing or shortness of breath. 18 g 3   No current facility-administered medications for this visit.     Musculoskeletal: Strength & Muscle Tone: within normal limits Gait & Station: normal Patient leans: N/A  Psychiatric Specialty Exam: Review of Systems  Psychiatric/Behavioral:  Negative for decreased concentration, dysphoric mood, hallucinations, self-injury, sleep disturbance and suicidal ideas. The patient is not nervous/anxious and is not hyperactive.     Last menstrual period 02/03/2012.There is no height or weight on  file to calculate BMI.  General Appearance: Casual  Eye Contact:  Good  Speech:  Clear and Coherent and Normal Rate  Volume:  Normal  Mood:  Euthymic  Affect:  Appropriate  Thought Process:  Coherent, Goal Directed, and Descriptions of Associations: Intact  Orientation:  Full (Time, Place, and Person)  Thought Content: WDL   Suicidal Thoughts:  No  Homicidal Thoughts:  No  Memory:  Immediate;   Good Recent;   Good Remote;   Good  Judgement:  Good  Insight:  Good  Psychomotor Activity:  Normal  Concentration:  Concentration: Good and Attention Span: Good  Recall:  Good  Fund of Knowledge: Good  Language: Good  Akathisia:  No  Handed:  Right  AIMS (if indicated): not done  Assets:  Communication Skills Desire for Improvement Financial Resources/Insurance Housing Leisure Time Physical Health Social Support  ADL's:  Intact   Cognition: WNL  Sleep:  Good   Screenings: GAD-7    Flowsheet Row Video Visit from 07/29/2024 in [facility] Office Visit from 06/28/2024 in [facility] - A Dept Of [place] Video Visit from 05/11/2024 in [facility] Video Visit from 02/17/2024 in [facility] Video Visit from 12/15/2023 in [facility]  Total GAD-7 Score 2 0 2 6 4    PHQ2-9    Flowsheet Row Video Visit from 07/29/2024 in [facility] Patient Outreach Telephone from 06/29/2024 in [place] [facility]PART[facility] Office Visit from 06/28/2024 in [facility] Internal Med Ctr - A Dept Of [place] Patient Outreach Telephone from 05/27/2024 in [place] POPULATION HEALTH DEPARTMENT Video Visit from 05/11/2024 in [facility]  PHQ-2 Total Score 2 0 0 0 0  PHQ-9 Total Score 3 -- 4 -- 4   Flowsheet Row Video Visit from 07/29/2024 in [facility] Admission (Discharged) from 09/15/2023 in [facility]  [facility]ENTER Pre-Admission Testing 60 from 09/09/2023 in [facility] PREADMISSION TESTING  C-SSRS RISK CATEGORY No Risk No Risk No Risk     Assessment and Plan:   [name] is a 63 year old female with a past psychiatric history significant for generalized anxiety disorder, PTSD, mild depression who presents to [facility] via virtual video picked up for follow-up and medication management.  Patient presents to the encounter stating that she continues to take her medications regularly and denies experiencing any adverse side effects.  Patient endorses minimal depression attributed to the recent passing of her sister-in-law.  Patient denies anxiety.  A PHQ-9 screen was performed with the patient scoring a  3.  A GAD-7 screen was also performed with the patient scoring a 2.  Patient also reports that her sleep has been generally okay.  She reports that she received an average 8 hours of sleep per night; however, it occasionally takes her a long time to fall asleep.  Patient endorses stability on her current medication regimen and would like to continue taking her medications as prescribed.  Patient's medications to be e-prescribed to pharmacy of choice.  A [place] Suicide Severity Rating Scale was performed with the patient being considered no risk.  Patient denies suicidal ideations and is able to contract for safety following the conclusion of the encounter.  Collaboration of Care: Collaboration of Care: Medication Management AEB provider managing patient's psychiatric  medications, Psychiatrist AEB patient being followed by mental health provider at this facility, and Other provider involved in patient's care AEB patient being seen by cardiology and oncology  Patient/Guardian was advised Release of Information must be obtained prior to any record release in order to collaborate their care with an outside provider. Patient/Guardian was advised if they have not already done so to contact the registration department to sign all necessary forms in order for us  to release information regarding their care.   Consent: Patient/Guardian gives verbal consent for treatment and assignment of benefits for services provided during this visit. Patient/Guardian expressed understanding and agreed to proceed.   1. GAD (generalized anxiety disorder)  - busPIRone  (BUSPAR ) 15 MG tablet; Take 1 tablet (15 mg total) by mouth 2 (two) times daily.  Dispense: 60 tablet; Refill: 3 - traZODone  (DESYREL ) 150 MG tablet; Take 1 tablet (150 mg total) by mouth at bedtime.  Dispense: 30 tablet; Refill: 3 - DULoxetine  (CYMBALTA ) 30 MG capsule; Take 1 capsule (30 mg total) by mouth daily.  Dispense: 30 capsule; Refill: 3 - hydrOXYzine   (ATARAX ) 50 MG tablet; Take 1 tablet (50 mg total) by mouth at bedtime as needed.  Dispense: 30 tablet; Refill: 3  2. PTSD (post-traumatic stress disorder)  - busPIRone  (BUSPAR ) 15 MG tablet; Take 1 tablet (15 mg total) by mouth 2 (two) times daily.  Dispense: 60 tablet; Refill: 3 - traZODone  (DESYREL ) 150 MG tablet; Take 1 tablet (150 mg total) by mouth at bedtime.  Dispense: 30 tablet; Refill: 3 - DULoxetine  (CYMBALTA ) 30 MG capsule; Take 1 capsule (30 mg total) by mouth daily.  Dispense: 30 capsule; Refill: 3  3. Mild depression  - busPIRone  (BUSPAR ) 15 MG tablet; Take 1 tablet (15 mg total) by mouth 2 (two) times daily.  Dispense: 60 tablet; Refill: 3 - traZODone  (DESYREL ) 150 MG tablet; Take 1 tablet (150 mg total) by mouth at bedtime.  Dispense: 30 tablet; Refill: 3 - DULoxetine  (CYMBALTA ) 30 MG capsule; Take 1 capsule (30 mg total) by mouth daily.  Dispense: 30 capsule; Refill: 3 - hydrOXYzine  (ATARAX ) 50 MG tablet; Take 1 tablet (50 mg total) by mouth at bedtime as needed.  Dispense: 30 tablet; Refill: 3  Patient to follow up in 7 weeks Provider spent a total of 15 minutes with the patient/reviewing patient's chart  [name], PA 07/29/2024, 2:22 PM

## 2024-07-29 NOTE — ED Triage Notes (Signed)
 I was playing with my dog and he got aggressively playing and accidentally had bitten me on the top of my left hand, utd with vaccines. Some swelling/redness in left hand. No fever. DOI: 07-25-2024

## 2024-07-31 NOTE — ED Provider Notes (Signed)
 [facility]    CSN: [id number] Arrival date & time: 07/29/24  1655      History   Chief Complaint Chief Complaint  Patient presents with   Animal Bite    HPI ADDIE ALONGE is a 63 y.o. female.   Patient here today for evaluation of dog bite to the top of her left hand that occurred a few days ago (07/28).  She reports that she has developed some swelling and redness over the last couple days.  She has not had any fever.  Dog is up-to-date with vaccinations.  She has not had any numbness.  The history is provided by the patient.  Animal Bite Associated symptoms: no fever and no numbness     Past Medical History:  Diagnosis Date   Anemia    Anxiety    Aortic valve stenosis, severe    Arthritis    PAIN AND SEVERE OA LEFT KNEE ; S/P RIGHT TKA ON 02/03/12; HAS LOWER BACK PAIN-UNABLE TO STAND MORE THAN 10 MIN; ARTHRITIS ALL OVER   Asthma    Blood transfusion    2013[facility]   Breast cancer in female [facility])    Right   CAD (coronary artery disease)    Cath 2010 with DES x 1 RCA-- PT'S CARDIOLOGIST IS DR. MCALHANY   Chronic diastolic congestive heart failure (HCC)    COPD (chronic obstructive pulmonary disease) (HCC)    Mild   COPD exacerbation (HCC) 11/16/2022   Depression    Diabetes mellitus DIAGNOSED IN2010   Dyspnea    with much ambulation   Eczema    on back   GERD (gastroesophageal reflux disease)    Headache    migraines younger- rare now 02/07/21   Heart murmur    no current problems   History of hiatal hernia    History of kidney stones    passed or blasted   History of UTI 12/05/2022   Hyperlipidemia    Hypertension    Morbid obesity with body mass index of 60.0-69.9 in adult [facility])    Myocardial infarction (HCC)    PT THINKS SHE WAS DX WITH MI AT THE TIME OF HEART STENTING   Neuromuscular disorder (HCC)    bilateral arm/hands   Normocytic anemia 02/08/2012   Oxygen dependent    uses 3L oxygen night/prn   Personal history of radiation  therapy    Pneumonia    Pulmonary embolism (HCC) 02/08/2012   S/P RT TOTAL KNEE ON 02/03/12--ON 02/08/12--DEVELOPED ACUTE SOB AND CHEST PAIN--AND DIAGNOSED WITH  PULMONARY EMBOLUS AND PNEUMONIA   Restless leg syndrome    Sleep apnea    Type 2 diabetes mellitus without complication, without long-term current use of insulin  (HCC) 02/08/2012   Uterine fibroid    NO PROBLEMS AT PRESENT FROM THE FIBROIDS-STATES SHE IS POST MENOPAUSAL-LAST MENSES 2010 EXCEPT FOR EPISODE THIS YR OF BLEEDING RELATED TO FIBROIDS.   Weakness    BOTH HANDS - S/P BILATERAL CARPAL TUNNEL RELEASE--BUT STILL HAS WEAKNESS--OFTEN DROPS THINGS    Patient Active Problem List   Diagnosis Date Noted   Acute hip pain, right 04/25/2024   Mitral stenosis 04/20/2024   Biceps tendonitis on right 09/19/2023   OA (osteoarthritis) of shoulder 09/15/2023   S/P reverse total shoulder arthroplasty, right 09/15/2023   Chronic constipation 08/15/2023   History of iron  deficiency 08/15/2023   Antiplatelet or antithrombotic long-term use 08/15/2023   Abdominal hernia 03/19/2023   Diabetic neuropathy (HCC) 08/29/2022   Vaginal lesion  08/29/2022   PTSD (post-traumatic stress disorder) 06/24/2022   GAD (generalized anxiety disorder) 06/24/2022   Major depressive disorder, recurrent episode, moderate (HCC) 06/24/2022   Urinary incontinence 06/20/2022   Intertrigo 06/20/2022   Arthritis of left shoulder region    S/P reverse total shoulder arthroplasty, 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   Healthcare maintenance 11/17/2021   Spondylolisthesis, lumbar region 02/11/2021    Class: Acute   Malignant neoplasm of upper-outer quadrant of right breast in female, estrogen receptor positive (HCC) 06/10/2018   Restless leg syndrome 02/04/2018   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   Depression 08/06/2015   Morbid obesity (HCC)    Hypertension associated with diabetes (HCC)    Aortic stenosis s/p TAVR    (HFpEF) heart failure with preserved ejection  fraction (HCC)    COPD (chronic obstructive pulmonary disease) (HCC) 09/21/2013   Obstructive sleep apnea 09/21/2013   Nephrolithiasis 09/21/2013   Nocturnal hypoxemia 09/12/2013   Hyperlipidemia 11/30/2012   History of pulmonary embolus (PE) 07/09/2012   Asthma 02/08/2012   CAD (coronary artery disease) 12/08/2011    Past Surgical History:  Procedure Laterality Date   BACK SURGERY  02/11/2021   Dr Lucilla   BICEPT TENODESIS Right 09/15/2023   Procedure: BICEPS TENODESIS;  Surgeon: Addie Cordella Hamilton, MD;  Location: [facility] OR;  Service: Orthopedics;  Laterality: Right;   BREAST BIOPSY Right 06/04/2018   BREAST LUMPECTOMY Right 06/2018   BREAST LUMPECTOMY WITH RADIOACTIVE SEED AND SENTINEL LYMPH NODE BIOPSY Right 07/19/2018   Procedure: BREAST LUMPECTOMY WITH RADIOACTIVE SEED AND SENTINEL LYMPH NODE BIOPSY;  Surgeon: Ethyl Lenis, MD;  Location: [facility];  Service: General;  Laterality: Right;   CARPAL TUNNEL RELEASE     Bilateral   CHOLECYSTECTOMY     COLONOSCOPY  2010   CORONARY ANGIOPLASTY     2010 has stent in place   CYSTOSCOPY W/ RETROGRADES Right 09/21/2013   Procedure: CYSTOSCOPY WITH RIGHT RETROGRADE PYELOGRAM RIGHT DOUBLE J STENT ;  Surgeon: Donnice Gwenyth Brooks, MD;  Location: [facility];  Service: Urology;  Laterality: Right;   CYSTOSCOPY W/ URETERAL STENT PLACEMENT Right 11/16/2022   Procedure: CYSTOSCOPY WITH RETROGRADE PYELOGRAM/URETERAL STENT PLACEMENT;  Surgeon: Lovie Arlyss CROME, MD;  Location: [facility] OR;  Service: Urology;  Laterality: Right;   CYSTOSCOPY WITH RETROGRADE PYELOGRAM, URETEROSCOPY AND STENT PLACEMENT Right 01/13/2023   Procedure: CYSTOSCOPY WITH RETROGRADE PYELOGRAM, URETEROSCOPY AND STENT EXCHANGE;  Surgeon: Roseann Adine PARAS., MD;  Location: [facility];  Service: Urology;  Laterality: Right;   CYSTOSCOPY WITH URETEROSCOPY AND STENT PLACEMENT Right 10/25/2013   Procedure: CYSTOSCOPY RIGHT URETEROSCOPY HOLMIUM LASER LITHO AND STENT PLACEMENT;  Surgeon: Donnice Gwenyth Brooks, MD;  Location: [facility];  Service: Urology;  Laterality: Right;   HERNIA REPAIR  1995   umbilical ,   HOLMIUM LASER APPLICATION Right 01/13/2023   Procedure: HOLMIUM LASER APPLICATION;  Surgeon: Roseann Adine PARAS., MD;  Location: [facility];  Service: Urology;  Laterality: Right;   INTRAOPERATIVE TRANSESOPHAGEAL ECHOCARDIOGRAM N/A 12/12/2014   Procedure: INTRAOPERATIVE TRANSESOPHAGEAL ECHOCARDIOGRAM;  Surgeon: Lonni JONETTA Cash, MD;  Location: [facility] OR;  Service: Cardiovascular;  Laterality: N/A;   KNEE ARTHROPLASTY  02/03/2012   Procedure: COMPUTER ASSISTED TOTAL KNEE ARTHROPLASTY;  Surgeon: Lonni CINDERELLA Poli, MD;  Location: [facility];  Service: Orthopedics;  Laterality: Right;  Right total knee arthroplasty   LEFT AND RIGHT HEART CATHETERIZATION WITH CORONARY ANGIOGRAM N/A 03/17/2013   Procedure: LEFT AND RIGHT HEART CATHETERIZATION WITH CORONARY ANGIOGRAM;  Surgeon: Lonni JONETTA Cash, MD;  Location: [facility] CATH LAB;  Service: Cardiovascular;  Laterality: N/A;   LEFT AND RIGHT HEART CATHETERIZATION WITH CORONARY/GRAFT ANGIOGRAM N/A 09/14/2014   Procedure: LEFT AND RIGHT HEART CATHETERIZATION WITH EL BILE;  Surgeon: Lonni JONETTA Cash, MD;  Location: [facility] CATH LAB;  Service: Cardiovascular;  Laterality: N/A;   REVERSE SHOULDER ARTHROPLASTY Left 03/06/2022   Procedure: LEFT SHOULDER REPLACEMENT APPLICATION OF WOUND VAC;  Surgeon: Addie Cordella Hamilton, MD;  Location: [facility];  Service: Orthopedics;  Laterality: Left;   REVERSE SHOULDER ARTHROPLASTY Right 09/15/2023   Procedure: REVERSE SHOULDER ARTHROPLASTY;  Surgeon: Addie Cordella Hamilton, MD;  Location: [facility] OR;  Service: Orthopedics;  Laterality: Right;   TEE WITHOUT CARDIOVERSION N/A 03/14/2013   Procedure: TRANSESOPHAGEAL ECHOCARDIOGRAM (TEE);  Surgeon: Redell GORMAN Shallow, MD;  Location: [facility] ENDOSCOPY;  Service: Cardiovascular;  Laterality: N/A;   TEE WITHOUT CARDIOVERSION N/A 11/14/2014   Procedure: TRANSESOPHAGEAL  ECHOCARDIOGRAM (TEE);  Surgeon: Redell GORMAN Shallow, MD;  Location: [facility] ENDOSCOPY;  Service: Cardiovascular;  Laterality: N/A;   TONSILLECTOMY     maybe as a child- does not know   TOTAL KNEE ARTHROPLASTY  09/10/2012   Procedure: TOTAL KNEE ARTHROPLASTY;  Surgeon: Lonni CINDERELLA Poli, MD;  Location: [facility];  Service: Orthopedics;  Laterality: Left;   TOTAL KNEE REVISION Right 07/15/2013   Procedure: REVISION ARTHROPLASTY RIGHT KNEE;  Surgeon: Lonni CINDERELLA Poli, MD;  Location: [facility];  Service: Orthopedics;  Laterality: Right;   TRANSCATHETER AORTIC VALVE REPLACEMENT, TRANSFEMORAL N/A 12/12/2014   Procedure: TRANSCATHETER AORTIC VALVE REPLACEMENT, TRANSFEMORAL;  Surgeon: Lonni JONETTA Cash, MD;  Location: [facility] OR;  Service: Cardiovascular;  Laterality: N/A;   TRIGGER FINGER RELEASE  09/10/2012   Procedure: RELEASE TRIGGER FINGER/A-1 PULLEY;  Surgeon: Lonni CINDERELLA Poli, MD;  Location: [facility];  Service: Orthopedics;  Laterality: Right;  Right Ring Finger   TUBAL LIGATION      OB History     Gravida  3   Para      Term      Preterm      AB  1   Living         SAB  1   IAB      Ectopic      Multiple      Live Births  2        Obstetric Comments  SVD x 2. SAB x 1.           Home Medications    Prior to Admission medications   Medication Sig Start Date End Date Taking? Authorizing Provider  amoxicillin -clavulanate (AUGMENTIN ) 875-125 MG tablet Take 1 tablet by mouth every 12 (twelve) hours. 07/29/24  Yes Billy Asberry FALCON, PA-C  BOOSTRIX  5-2.5-18.5 LF-MCG/0.5 injection Inject 0.5 mLs into the muscle once. 03/31/24  Yes [provider]  COMIRNATY syringe Inject 0.3 mLs into the muscle once. 03/31/24  Yes [provider]  ketorolac  (ACULAR ) 0.5 % ophthalmic solution Place 1 drop into the right eye 4 (four) times daily. 07/26/24  Yes [provider]  ofloxacin (OCUFLOX) 0.3 % ophthalmic solution Place 1 drop into the right eye 4 (four)  times daily. 07/26/24  Yes [provider]  prednisoLONE acetate (PRED FORTE) 1 % ophthalmic suspension Place 1 drop into the right eye 4 (four) times daily. 07/26/24  Yes [provider]  OLLIE 720-20 ELU-MCG/ML injection Inject 1 mL into the muscle once. 03/31/24  Yes [provider]  acetaminophen  (TYLENOL ) 500 MG tablet Take 1,000 mg by mouth every 6 (six) hours as needed for moderate pain.  [provider]  amLODipine  (NORVASC ) 5 MG tablet Take 1 tablet (5 mg total) by mouth daily. 03/23/23 06/29/24  Susen Pastor, MD  aspirin  EC 81 MG tablet Take 1 tablet (81 mg total) by mouth daily. Swallow whole. 09/16/23   Magnant, Carlin CROME, PA-C  atorvastatin  (LIPITOR ) 80 MG tablet Take 1 tablet (80 mg total) by mouth daily. 02/10/24 02/09/25  Verlin Lonni BIRCH, MD  Blood Pressure Monitoring (BLOOD PRESSURE CUFF) MISC Please follow insert instructions. 08/19/23   Arellano Zameza, Priscila, MD  busPIRone  (BUSPAR ) 15 MG tablet Take 1 tablet (15 mg total) by mouth 2 (two) times daily. 07/29/24   Nwoko, Uchenna E, PA  clopidogrel  (PLAVIX ) 75 MG tablet Take 1 tablet (75 mg total) by mouth every morning. 02/10/24   Verlin Lonni BIRCH, MD  DULoxetine  (CYMBALTA ) 30 MG capsule Take 1 capsule (30 mg total) by mouth daily. 07/29/24   Nwoko, Uchenna E, PA  Evolocumab  (REPATHA  SURECLICK) 140 MG/ML SOAJ Inject 140 mg into the skin every 14 (fourteen) days. 08/18/23   Forest Coy, MD  ezetimibe  (ZETIA ) 10 MG tablet Take 1 tablet by mouth once daily 02/04/24   Verlin Lonni BIRCH, MD  Fluticasone -Umeclidin-Vilant (TRELEGY ELLIPTA ) 100-62.5-25 MCG/ACT AEPB Inhale 1 puff into the lungs daily. 07/12/24   Nooruddin, Saad, MD  gabapentin  (NEURONTIN ) 300 MG capsule TAKE 1 CAPSULE BY MOUTH IN THE MORNING, 1 CAPSULE IN THE AFTERNOON AND 3 CAPSULES AT BEDTIME 06/06/24   Arellano Zameza, Priscila, MD  hydrOXYzine  (ATARAX ) 50 MG tablet Take 1 tablet (50 mg total) by mouth at bedtime as  needed. 07/29/24   Nwoko, Uchenna E, PA  losartan -hydrochlorothiazide  (HYZAAR) 50-12.5 MG tablet Take 1 tablet by mouth daily. 02/10/24   Verlin Lonni BIRCH, MD  metFORMIN  (GLUCOPHAGE -XR) 500 MG 24 hr tablet Take 1 tablet by mouth twice daily 06/06/24   Arellano Zameza, Priscila, MD  metoprolol  succinate (TOPROL -XL) 50 MG 24 hr tablet Take 1 tablet (50 mg total) by mouth daily. Take with or immediately following a meal.TAKE 1 TABLET BY MOUTH ONCE DAILY WITH MEALS OR  IMMEDIATELY  FOLLOWING  A  MEAL 10/26/23   Arellano Zameza, Priscila, MD  montelukast  (SINGULAIR ) 10 MG tablet TAKE 1 TABLET BY MOUTH AT BEDTIME 06/06/24   Arellano Zameza, Priscila, MD  Semaglutide -Weight Management (WEGOVY ) 2.4 MG/0.75ML SOAJ Inject 2.4 mg into the skin once a week. 07/21/24   Nooruddin, Saad, MD  traZODone  (DESYREL ) 150 MG tablet Take 1 tablet (150 mg total) by mouth at bedtime. 07/29/24   Nwoko, Uchenna E, PA  VENTOLIN  HFA 108 (90 Base) MCG/ACT inhaler Inhale 2 puffs into the lungs every 6 (six) hours as needed for wheezing or shortness of breath. 05/16/24   Arellano Zameza, Priscila, MD  Fluticasone -Salmeterol (ADVAIR  DISKUS) 100-50 MCG/DOSE AEPB Inhale 1 puff into the lungs daily. 02/20/20 05/09/20  Marsa Barter, MD    Family History Family History  Problem Relation Age of Onset   Breast cancer Mother 54 - 63       stage IV at diagnosis   Emphysema Mother        smoked   Heart disease Mother    COPD Father        smoked   Asthma Father    Heart disease Father    Cancer Brother        Sinus   Colon cancer Neg Hx    Esophageal cancer Neg Hx    Inflammatory bowel disease Neg Hx    Liver disease Neg Hx  Pancreatic cancer Neg Hx    Rectal cancer Neg Hx    Stomach cancer Neg Hx     Social History Social History   Tobacco Use   Smoking status: Former    Current packs/day: 0.00    Average packs/day: 1.5 packs/day for 30.0 years (45.0 ttl pk-yrs)    Types: Cigarettes    Start date: 12/29/1970    Quit  date: 12/29/2000    Years since quitting: 23.6   Smokeless tobacco: Never  Vaping Use   Vaping status: Never Used  Substance Use Topics   Alcohol use: Not Currently   Drug use: Not Currently    Types: Marijuana    Comment: CBD oils through vape shops     Allergies   Patient has no known allergies.   Review of Systems Review of Systems  Constitutional:  Negative for chills and fever.  Eyes:  Negative for discharge and redness.  Respiratory:  Negative for shortness of breath.   Gastrointestinal:  Negative for abdominal pain, nausea and vomiting.  Skin:  Positive for color change and wound.  Neurological:  Negative for numbness.     Physical Exam Triage Vital Signs ED Triage Vitals  Encounter Vitals Group     BP 07/29/24 1711 (!) 148/85     Girls Systolic BP Percentile --      Girls Diastolic BP Percentile --      Boys Systolic BP Percentile --      Boys Diastolic BP Percentile --      Pulse Rate 07/29/24 1711 91     Resp 07/29/24 1711 20     Temp 07/29/24 1711 98.6 F (37 C)     Temp Source 07/29/24 1711 Oral     SpO2 07/29/24 1711 95 %     Weight 07/29/24 1709 260 lb (117.9 kg)     Height 07/29/24 1709 5' (1.524 m)     Head Circumference --      Peak Flow --      Pain Score 07/29/24 1706 2     Pain Loc --      Pain Education --      Exclude from Growth Chart --    No data found.  Updated Vital Signs BP (!) 148/85 (BP Location: Right Arm)   Pulse 91   Temp 98.6 F (37 C) (Oral)   Resp 20   Ht 5' (1.524 m)   Wt 260 lb (117.9 kg)   LMP 02/03/2012   SpO2 95%   BMI 50.78 kg/m   Visual Acuity Right Eye Distance:   Left Eye Distance:   Bilateral Distance:    Right Eye Near:   Left Eye Near:    Bilateral Near:     Physical Exam Vitals and nursing note reviewed.  Constitutional:      General: She is not in acute distress.    Appearance: Normal appearance. She is not ill-appearing.  HENT:     Head: Normocephalic and atraumatic.  Eyes:      Conjunctiva/sclera: Conjunctivae normal.  Cardiovascular:     Rate and Rhythm: Normal rate.  Pulmonary:     Effort: Pulmonary effort is normal. No respiratory distress.  Musculoskeletal:     Comments: Mildly decreased range of motion of left fingers due to swelling.  Full range of motion of the left wrist.  Skin:    Comments: See photo  Neurological:     Mental Status: She is alert.  Psychiatric:  Mood and Affect: Mood normal.        Behavior: Behavior normal.        Thought Content: Thought content normal.      UC Treatments / Results  Labs (all labs ordered are listed, but only abnormal results are displayed) Labs Reviewed - No data to display  EKG   Radiology No results found.  Procedures Procedures (including critical care time)  Medications Ordered in UC Medications - No data to display  Initial Impression / Assessment and Plan / UC Course  I have reviewed the triage vital signs and the nursing notes.  Pertinent labs & imaging results that were available during my care of the patient were reviewed by me and considered in my medical decision making (see chart for details).    Suspect likely cellulitis secondary to dog bite.  Will treat with Augmentin  and advised follow-up if no gradual improvement or with any further concerns.  Patient expressed understanding.  Final Clinical Impressions(s) / UC Diagnoses   Final diagnoses:  Cellulitis of left hand  Dog bite of left hand, initial encounter   Discharge Instructions   None    ED Prescriptions     Medication Sig Dispense Auth. Provider   amoxicillin -clavulanate (AUGMENTIN ) 875-125 MG tablet Take 1 tablet by mouth every 12 (twelve) hours. 14 tablet Billy Asberry FALCON, PA-C      PDMP not reviewed this encounter.   Billy Asberry FALCON, PA-C 07/31/24 1208

## 2024-08-01 ENCOUNTER — Other Ambulatory Visit: Payer: Self-pay

## 2024-08-01 DIAGNOSIS — E1159 Type 2 diabetes mellitus with other circulatory complications: Secondary | ICD-10-CM

## 2024-08-01 MED ORDER — METOPROLOL SUCCINATE ER 50 MG PO TB24: 50.0000 mg | ORAL_TABLET | Freq: Every day | ORAL | 2 refills | Status: AC

## 2024-08-01 NOTE — Telephone Encounter (Signed)
 Medication sent to pharmacy

## 2024-08-03 ENCOUNTER — Other Ambulatory Visit: Payer: Self-pay | Admitting: Orthopedic Surgery

## 2024-08-03 DIAGNOSIS — M545 Low back pain, unspecified: Secondary | ICD-10-CM

## 2024-08-11 ENCOUNTER — Telehealth: Payer: Self-pay | Admitting: *Deleted

## 2024-08-11 NOTE — Telephone Encounter (Signed)
   Pre-operative Risk Assessment    Patient Name: [name]  DOB: 1961/03/04 MRN: [id number]   Date of last office visit: 04/20/24 [name], [facility] Date of next office visit: 09/08/24 DR. [facility]   Request for Surgical Clearance    Procedure:  LUMBAR TFFESI  Date of Surgery:  Clearance TBD                                Surgeon: NOT LISTED Surgeon's Group or Practice Name:  [facility]  Phone number:  [phone] Fax number:  [phone]   Type of Clearance Requested:   - Medical  - Pharmacy:  Hold Clopidogrel  (Plavix ) x 7 DAYS PRIOR   Type of Anesthesia:  Not Indicated   Additional requests/questions:    Bonney Niels Jest   08/11/2024, 5:44 PM

## 2024-08-12 ENCOUNTER — Telehealth: Payer: Self-pay

## 2024-08-12 NOTE — Telephone Encounter (Signed)
   Name: [name]  DOB: Jul 31, 1961  MRN: [id number]  Primary Cardiologist: Lonni Cash, MD   Preoperative team, please contact this patient and set up a phone call appointment for further preoperative risk assessment. Please obtain consent and complete medication review. Thank you for your help.  I confirm that guidance regarding antiplatelet and oral anticoagulation therapy has been completed and, if necessary, noted below.  If she needs an injection, she can proceed at acceptable risk and hold Clopidogrel  for 5 days before. Would continue ASA if possible.   I also confirmed the patient resides in the state of [place] . As per [facility] Medical Board telemedicine laws, the patient must reside in the state in which the provider is licensed.   Lamarr Satterfield, NP 08/12/2024, 10:49 AM [facility]

## 2024-08-12 NOTE — Telephone Encounter (Signed)
 Preop tele appt now scheduled, med rec and consent done.

## 2024-08-12 NOTE — Telephone Encounter (Signed)
  Patient Consent for Virtual Visit        [name] has provided verbal consent on 08/12/2024 for a virtual visit (video or telephone).   CONSENT FOR VIRTUAL VISIT FOR:  [name]  By participating in this virtual visit I agree to the following:  I hereby voluntarily request, consent and authorize [facility] and its employed or contracted physicians, physician assistants, nurse practitioners or other licensed health care professionals (the Practitioner), to provide me with telemedicine health care services (the "Services) as deemed necessary by the treating Practitioner. I acknowledge and consent to receive the Services by the Practitioner via telemedicine. I understand that the telemedicine visit will involve communicating with the Practitioner through live audiovisual communication technology and the disclosure of certain medical information by electronic transmission. I acknowledge that I have been given the opportunity to request an in-person assessment or other available alternative prior to the telemedicine visit and am voluntarily participating in the telemedicine visit.  I understand that I have the right to withhold or withdraw my consent to the use of telemedicine in the course of my care at any time, without affecting my right to future care or treatment, and that the Practitioner or I may terminate the telemedicine visit at any time. I understand that I have the right to inspect all information obtained and/or recorded in the course of the telemedicine visit and may receive copies of available information for a reasonable fee.  I understand that some of the potential risks of receiving the Services via telemedicine include:  Delay or interruption in medical evaluation due to technological equipment failure or disruption; Information transmitted may not be sufficient (e.g. poor resolution of images) to allow for appropriate medical decision making by the  Practitioner; and/or  In rare instances, security protocols could fail, causing a breach of personal health information.  Furthermore, I acknowledge that it is my responsibility to provide information about my medical history, conditions and care that is complete and accurate to the best of my ability. I acknowledge that Practitioner's advice, recommendations, and/or decision may be based on factors not within their control, such as incomplete or inaccurate data provided by me or distortions of diagnostic images or specimens that may result from electronic transmissions. I understand that the practice of medicine is not an exact science and that Practitioner makes no warranties or guarantees regarding treatment outcomes. I acknowledge that a copy of this consent can be made available to me via my patient portal [facility] MyChart), or I can request a printed copy by calling the office of [facility].    I understand that my insurance will be billed for this visit.   I have read or had this consent read to me. I understand the contents of this consent, which adequately explains the benefits and risks of the Services being provided via telemedicine.  I have been provided ample opportunity to ask questions regarding this consent and the Services and have had my questions answered to my satisfaction. I give my informed consent for the services to be provided through the use of telemedicine in my medical care

## 2024-08-16 ENCOUNTER — Other Ambulatory Visit: Payer: Self-pay

## 2024-08-16 DIAGNOSIS — E1142 Type 2 diabetes mellitus with diabetic polyneuropathy: Secondary | ICD-10-CM

## 2024-08-17 ENCOUNTER — Telehealth: Payer: Self-pay

## 2024-08-17 DIAGNOSIS — E1142 Type 2 diabetes mellitus with diabetic polyneuropathy: Secondary | ICD-10-CM

## 2024-08-17 MED ORDER — GABAPENTIN 300 MG PO CAPS: ORAL_CAPSULE | ORAL | 0 refills | Status: DC

## 2024-08-17 NOTE — Telephone Encounter (Signed)
 Please send this request to the correct office as the [facility] office did not send this Referral to [facility].  Copied from CRM #[id number]. Topic: Referral - Status >> Aug 17, 2024 11:38 AM [facility] wrote: Reason for CRM: patient checking on status of insurance of approval for spinal epidural for back pain for the [facility] , agent provided patient the information showing for referrals  Pt. # [phone]

## 2024-08-17 NOTE — Telephone Encounter (Signed)
 Copied from CRM [id number]. Topic: Clinical - Medication Refill >> Aug 17, 2024 11:29 AM Alfonso ORN wrote: Medication: gabapentin  (NEURONTIN ) 300 MG capsule  Has the patient contacted their pharmacy? Yes (Agent: If no, request that the patient contact the pharmacy for the refill. If patient does not wish to contact the pharmacy document the reason why and proceed with request.) (Agent: If yes, when and what did the pharmacy advise?)  This is the patient's preferred pharmacy:   [facility] [address] [place], [place] - [address] [place] [address] Phone: [phone] Fax: [phone]   Is this the correct pharmacy for this prescription? Yes If no, delete pharmacy and type the correct one.   Has the prescription been filled recently? No  Is the patient out of the medication? No , pt. Has 2 days left of the medication   Has the patient been seen for an appointment in the last year OR does the patient have an upcoming appointment? Yes  Can we respond through MyChart? Yes  Agent: Please be advised that Rx refills may take up to 3 business days. We ask that you follow-up with your pharmacy.

## 2024-08-23 ENCOUNTER — Encounter: Payer: Self-pay | Admitting: Nurse Practitioner

## 2024-08-23 ENCOUNTER — Ambulatory Visit: Attending: Cardiology | Admitting: Nurse Practitioner

## 2024-08-23 DIAGNOSIS — Z01810 Encounter for preprocedural cardiovascular examination: Secondary | ICD-10-CM | POA: Diagnosis not present

## 2024-08-23 NOTE — Progress Notes (Signed)
 Virtual Visit via Telephone Note   Because of [name] co-morbid illnesses, she is at least at moderate risk for complications without adequate follow up.  This format is felt to be most appropriate for this patient at this time.  Due to technical limitations with video connection Web designer), today's appointment will be conducted as an audio only telehealth visit, and [name] verbally agreed to proceed in this manner.   All issues noted in this document were discussed and addressed.  No physical exam could be performed with this format.  Evaluation Performed:  Preoperative cardiovascular risk assessment _____________   Date:  08/23/2024   Patient ID:  [name], DOB 1961-02-10, MRN [id number] Patient Location:  Home Provider location:   Office  Primary Care Provider:  Edgardo Pontiff, DO Primary Cardiologist:  Lonni Cash, MD  Chief Complaint / Patient Profile   63 y.o. y/o female with a h/o CAD s/p DES to distal RCA in 2010, LHC 2015 showed 70-80% stenosis in ostial RCA with patent distal RCA Stent, nuclear perfusion study 12/2022 was low risk, severe AS s/p TAVR 11/2014, moderate mitral stenosis, HTN, HLD, morbid obesity who is pending lumbar TFESI with [facility] on date TBD and presents today for telephonic preoperative cardiovascular risk assessment.  History of Present Illness    [name] is a 63 y.o. female who presents via audio/video conferencing for a telehealth visit today.  Pt was last seen in cardiology clinic on 04/20/24 by [name], PA.  At that time [name] was doing well.  The patient is now pending procedure as outlined above. Since her last visit, she denies chest pain, shortness of breath, lower extremity edema, fatigue, palpitations, melena, hematuria, hemoptysis, diaphoresis, weakness, presyncope, syncope, orthopnea, and PND. She is somewhat limited by back and hip pain but is able to complete  > 4 METS activity without concerning cardiac symptoms.   Past Medical History    Past Medical History:  Diagnosis Date   Anemia    Anxiety    Aortic valve stenosis, severe    Arthritis    PAIN AND SEVERE OA LEFT KNEE ; S/P RIGHT TKA ON 02/03/12; HAS LOWER BACK PAIN-UNABLE TO STAND MORE THAN 10 MIN; ARTHRITIS ALL OVER   Asthma    Blood transfusion    2013[facility]   Breast cancer in female [facility])    Right   CAD (coronary artery disease)    Cath 2010 with DES x 1 RCA-- PT'S CARDIOLOGIST IS DR. MCALHANY   Chronic diastolic congestive heart failure (HCC)    COPD (chronic obstructive pulmonary disease) (HCC)    Mild   COPD exacerbation (HCC) 11/16/2022   Depression    Diabetes mellitus DIAGNOSED IN2010   Dyspnea    with much ambulation   Eczema    on back   GERD (gastroesophageal reflux disease)    Headache    migraines younger- rare now 02/07/21   Heart murmur    no current problems   History of hiatal hernia    History of kidney stones    passed or blasted   History of UTI 12/05/2022   Hyperlipidemia    Hypertension    Morbid obesity with body mass index of 60.0-69.9 in adult [facility])    Myocardial infarction (HCC)    PT THINKS SHE WAS DX WITH MI AT THE TIME OF HEART STENTING   Neuromuscular disorder (HCC)    bilateral arm/hands   Normocytic anemia  02/08/2012   Oxygen dependent    uses 3L oxygen night/prn   Personal history of radiation therapy    Pneumonia    Pulmonary embolism (HCC) 02/08/2012   S/P RT TOTAL KNEE ON 02/03/12--ON 02/08/12--DEVELOPED ACUTE SOB AND CHEST PAIN--AND DIAGNOSED WITH  PULMONARY EMBOLUS AND PNEUMONIA   Restless leg syndrome    Sleep apnea    Type 2 diabetes mellitus without complication, without long-term current use of insulin  (HCC) 02/08/2012   Uterine fibroid    NO PROBLEMS AT PRESENT FROM THE FIBROIDS-STATES SHE IS POST MENOPAUSAL-LAST MENSES 2010 EXCEPT FOR EPISODE THIS YR OF BLEEDING RELATED TO FIBROIDS.   Weakness    BOTH HANDS - S/P  BILATERAL CARPAL TUNNEL RELEASE--BUT STILL HAS WEAKNESS--OFTEN DROPS THINGS   Past Surgical History:  Procedure Laterality Date   BACK SURGERY  02/11/2021   Dr Lucilla   BICEPT TENODESIS Right 09/15/2023   Procedure: BICEPS TENODESIS;  Surgeon: Addie Cordella Hamilton, MD;  Location: [facility] OR;  Service: Orthopedics;  Laterality: Right;   BREAST BIOPSY Right 06/04/2018   BREAST LUMPECTOMY Right 06/2018   BREAST LUMPECTOMY WITH RADIOACTIVE SEED AND SENTINEL LYMPH NODE BIOPSY Right 07/19/2018   Procedure: BREAST LUMPECTOMY WITH RADIOACTIVE SEED AND SENTINEL LYMPH NODE BIOPSY;  Surgeon: Ethyl Lenis, MD;  Location: [facility] OR;  Service: General;  Laterality: Right;   CARPAL TUNNEL RELEASE     Bilateral   CHOLECYSTECTOMY     COLONOSCOPY  2010   CORONARY ANGIOPLASTY     2010 has stent in place   CYSTOSCOPY W/ RETROGRADES Right 09/21/2013   Procedure: CYSTOSCOPY WITH RIGHT RETROGRADE PYELOGRAM RIGHT DOUBLE J STENT ;  Surgeon: Donnice Gwenyth Brooks, MD;  Location: [facility] ORS;  Service: Urology;  Laterality: Right;   CYSTOSCOPY W/ URETERAL STENT PLACEMENT Right 11/16/2022   Procedure: CYSTOSCOPY WITH RETROGRADE PYELOGRAM/URETERAL STENT PLACEMENT;  Surgeon: Lovie Arlyss CROME, MD;  Location: [facility] OR;  Service: Urology;  Laterality: Right;   CYSTOSCOPY WITH RETROGRADE PYELOGRAM, URETEROSCOPY AND STENT PLACEMENT Right 01/13/2023   Procedure: CYSTOSCOPY WITH RETROGRADE PYELOGRAM, URETEROSCOPY AND STENT EXCHANGE;  Surgeon: Roseann Adine PARAS., MD;  Location: [facility] ORS;  Service: Urology;  Laterality: Right;   CYSTOSCOPY WITH URETEROSCOPY AND STENT PLACEMENT Right 10/25/2013   Procedure: CYSTOSCOPY RIGHT URETEROSCOPY HOLMIUM LASER LITHO AND STENT PLACEMENT;  Surgeon: Donnice Gwenyth Brooks, MD;  Location: [facility]S;  Service: Urology;  Laterality: Right;   HERNIA REPAIR  1995   umbilical ,   HOLMIUM LASER APPLICATION Right 01/13/2023   Procedure: HOLMIUM LASER APPLICATION;  Surgeon: Roseann Adine PARAS., MD;  Location: [facility]  ORS;  Service: Urology;  Laterality: Right;   INTRAOPERATIVE TRANSESOPHAGEAL ECHOCARDIOGRAM N/A 12/12/2014   Procedure: INTRAOPERATIVE TRANSESOPHAGEAL ECHOCARDIOGRAM;  Surgeon: Lonni JONETTA Cash, MD;  Location[facility] OR;  Service: Cardiovascular;  Laterality: N/A;   KNEE ARTHROPLASTY  02/03/2012   Procedure: COMPUTER ASSISTED TOTAL KNEE ARTHROPLASTY;  Surgeon: Lonni CINDERELLA Poli, MD;  Location: [facility] OR;  Service: Orthopedics;  Laterality: Right;  Right total knee arthroplasty   LEFT AND RIGHT HEART CATHETERIZATION WITH CORONARY ANGIOGRAM N/A 03/17/2013   Procedure: LEFT AND RIGHT HEART CATHETERIZATION WITH CORONARY ANGIOGRAM;  Surgeon: Lonni JONETTA Cash, MD;  Location: [facility] CATH LAB;  Service: Cardiovascular;  Laterality: N/A;   LEFT AND RIGHT HEART CATHETERIZATION WITH CORONARY/GRAFT ANGIOGRAM N/A 09/14/2014   Procedure: LEFT AND RIGHT HEART CATHETERIZATION WITH EL BILE;  Surgeon: Lonni JONETTA Cash, MD;  Location: [facility] CATH LAB;  Service: Cardiovascular;  Laterality: N/A;   REVERSE SHOULDER ARTHROPLASTY Left 03/06/2022  Procedure: LEFT SHOULDER REPLACEMENT APPLICATION OF WOUND VAC;  Surgeon: Addie Cordella Hamilton, MD;  Location: [facility] OR;  Service: Orthopedics;  Laterality: Left;   REVERSE SHOULDER ARTHROPLASTY Right 09/15/2023   Procedure: REVERSE SHOULDER ARTHROPLASTY;  Surgeon: Addie Cordella Hamilton, MD;  Location: [facility] OR;  Service: Orthopedics;  Laterality: Right;   TEE WITHOUT CARDIOVERSION N/A 03/14/2013   Procedure: TRANSESOPHAGEAL ECHOCARDIOGRAM (TEE);  Surgeon: Redell GORMAN Shallow, MD;  Location: [facility] ENDOSCOPY;  Service: Cardiovascular;  Laterality: N/A;   TEE WITHOUT CARDIOVERSION N/A 11/14/2014   Procedure: TRANSESOPHAGEAL ECHOCARDIOGRAM (TEE);  Surgeon: Redell GORMAN Shallow, MD;  Location: [facility] ENDOSCOPY;  Service: Cardiovascular;  Laterality: N/A;   TONSILLECTOMY     maybe as a child- does not know   TOTAL KNEE ARTHROPLASTY  09/10/2012   Procedure: TOTAL KNEE ARTHROPLASTY;   Surgeon: Lonni CINDERELLA Poli, MD;  Location: [facility] ORS;  Service: Orthopedics;  Laterality: Left;   TOTAL KNEE REVISION Right 07/15/2013   Procedure: REVISION ARTHROPLASTY RIGHT KNEE;  Surgeon: Lonni CINDERELLA Poli, MD;  Location: [facility] ORS;  Service: Orthopedics;  Laterality: Right;   TRANSCATHETER AORTIC VALVE REPLACEMENT, TRANSFEMORAL N/A 12/12/2014   Procedure: TRANSCATHETER AORTIC VALVE REPLACEMENT, TRANSFEMORAL;  Surgeon: Lonni JONETTA Cash, MD;  Location: [facility] OR;  Service: Cardiovascular;  Laterality: N/A;   TRIGGER FINGER RELEASE  09/10/2012   Procedure: RELEASE TRIGGER FINGER/A-1 PULLEY;  Surgeon: Lonni CINDERELLA Poli, MD;  Location: [facility] ORS;  Service: Orthopedics;  Laterality: Right;  Right Ring Finger   TUBAL LIGATION      Allergies  No Known Allergies  Home Medications    Prior to Admission medications   Medication Sig Start Date End Date Taking? Authorizing Provider  acetaminophen  (TYLENOL ) 500 MG tablet Take 1,000 mg by mouth every 6 (six) hours as needed for moderate pain.    [provider]  amLODipine  (NORVASC ) 5 MG tablet Take 1 tablet (5 mg total) by mouth daily. 03/23/23 08/12/24  Susen Pastor, MD  amoxicillin -clavulanate (AUGMENTIN ) 875-125 MG tablet Take 1 tablet by mouth every 12 (twelve) hours. 07/29/24   Billy Asberry FALCON, PA-C  aspirin  EC 81 MG tablet Take 1 tablet (81 mg total) by mouth daily. Swallow whole. 09/16/23   Magnant, Charles L, PA-C  atorvastatin  (LIPITOR ) 80 MG tablet Take 1 tablet (80 mg total) by mouth daily. 02/10/24 02/09/25  Cash Lonni JONETTA, MD  Blood Pressure Monitoring (BLOOD PRESSURE CUFF) MISC Please follow insert instructions. 08/19/23   Arellano Zameza, Priscila, MD  BOOSTRIX  5-2.5-18.5 LF-MCG/0.5 injection Inject 0.5 mLs into the muscle once. 03/31/24   [provider]  busPIRone  (BUSPAR ) 15 MG tablet Take 1 tablet (15 mg total) by mouth 2 (two) times daily. 07/29/24   Nwoko, Uchenna E, PA  clopidogrel  (PLAVIX ) 75 MG  tablet Take 1 tablet (75 mg total) by mouth every morning. 02/10/24   Cash Lonni JONETTA, MD  COMIRNATY syringe Inject 0.3 mLs into the muscle once. 03/31/24   [provider]  DULoxetine  (CYMBALTA ) 30 MG capsule Take 1 capsule (30 mg total) by mouth daily. 07/29/24   Nwoko, Uchenna E, PA  Evolocumab  (REPATHA  SURECLICK) 140 MG/ML SOAJ Inject 140 mg into the skin every 14 (fourteen) days. 08/18/23   Forest Coy, MD  ezetimibe  (ZETIA ) 10 MG tablet Take 1 tablet by mouth once daily 02/04/24   Cash Lonni JONETTA, MD  Fluticasone -Umeclidin-Vilant (TRELEGY ELLIPTA ) 100-62.5-25 MCG/ACT AEPB Inhale 1 puff into the lungs daily. 07/12/24   Nooruddin, Saad, MD  gabapentin  (NEURONTIN ) 300 MG capsule TAKE 1 CAPSULE BY MOUTH  IN THE MORNING, 1 CAPSULE IN THE AFTERNOON AND 3 CAPSULES AT BEDTIME 08/17/24   Syeda, Raeeha, DO  hydrOXYzine  (ATARAX ) 50 MG tablet Take 1 tablet (50 mg total) by mouth at bedtime as needed. 07/29/24   Nwoko, Uchenna E, PA  ketorolac  (ACULAR ) 0.5 % ophthalmic solution Place 1 drop into the right eye 4 (four) times daily. 07/26/24   [provider]  losartan -hydrochlorothiazide  (HYZAAR) 50-12.5 MG tablet Take 1 tablet by mouth daily. 02/10/24   Verlin Lonni BIRCH, MD  metFORMIN  (GLUCOPHAGE -XR) 500 MG 24 hr tablet Take 1 tablet by mouth twice daily 06/06/24   Arellano Zameza, Priscila, MD  metoprolol  succinate (TOPROL -XL) 50 MG 24 hr tablet Take 1 tablet (50 mg total) by mouth daily. Take with or immediately following a meal.TAKE 1 TABLET BY MOUTH ONCE DAILY WITH MEALS OR  IMMEDIATELY  FOLLOWING  A  MEAL 08/01/24   Norrine Sharper, MD  montelukast  (SINGULAIR ) 10 MG tablet TAKE 1 TABLET BY MOUTH AT BEDTIME 06/06/24   Arellano Zameza, Priscila, MD  ofloxacin (OCUFLOX) 0.3 % ophthalmic solution Place 1 drop into the right eye 4 (four) times daily. 07/26/24   [provider]  prednisoLONE acetate (PRED FORTE) 1 % ophthalmic suspension Place 1 drop into the right eye 4  (four) times daily. 07/26/24   [provider]  Semaglutide -Weight Management (WEGOVY ) 2.4 MG/0.75ML SOAJ Inject 2.4 mg into the skin once a week. 07/21/24   Nooruddin, Saad, MD  traZODone  (DESYREL ) 150 MG tablet Take 1 tablet (150 mg total) by mouth at bedtime. 07/29/24   Nwoko, Uchenna E, PA  TWINRIX 720-20 ELU-MCG/ML injection Inject 1 mL into the muscle once. 03/31/24   [provider]  VENTOLIN  HFA 108 (90 Base) MCG/ACT inhaler Inhale 2 puffs into the lungs every 6 (six) hours as needed for wheezing or shortness of breath. 05/16/24   Arellano Zameza, Priscila, MD  Fluticasone -Salmeterol (ADVAIR  DISKUS) 100-50 MCG/DOSE AEPB Inhale 1 puff into the lungs daily. 02/20/20 05/09/20  Marsa Barter, MD    Physical Exam    Vital Signs:  [name] does not have vital signs available for review today.  Given telephonic nature of communication, physical exam is limited. AAOx3. NAD. Normal affect.  Speech and respirations are unlabored.  Accessory Clinical Findings    None  Assessment & Plan    1.  Preoperative Cardiovascular Risk Assessment: According to the Revised Cardiac Risk Index (RCRI), her Perioperative Risk of Major Cardiac Event is (%): 0.9. Her Functional Capacity in METs is: 4.4 according to the Duke Activity Status Index (DASI). The patient is doing well from a cardiac perspective. Therefore, based on ACC/AHA guidelines, the patient would be at acceptable risk for the planned procedure without further cardiovascular testing.   The patient was advised that if she develops new symptoms prior to surgery to contact our office to arrange for a follow-up visit, and she verbalized understanding.  Per office protocol, she may hold Plavix  for 5 days prior to procedure and should resume as soon as hemodynamically stable postoperatively. Ideally aspirin  should be continued without interruption, however if the bleeding risk is too great, aspirin  may be held for 5-7 days prior  to surgery. Please resume aspirin  post operatively when it is felt to be safe from a bleeding standpoint.    A copy of this note will be routed to requesting surgeon.  Time:   Today, I have spent 10 minutes with the patient with telehealth technology discussing medical history, symptoms, and management  plan.     [name] EMERSON Bane, NP-C  08/23/2024, 10:28 AM [address] [place], [place] [address] Office [phone] Fax [phone]

## 2024-08-24 MED ORDER — GABAPENTIN 300 MG PO CAPS: ORAL_CAPSULE | ORAL | 0 refills | Status: DC

## 2024-08-27 ENCOUNTER — Other Ambulatory Visit: Payer: Self-pay | Admitting: Student

## 2024-08-30 NOTE — Telephone Encounter (Signed)
 Dr. Verlin,  Ms. Puig was given permission to hold Plavix  for 5 days prior to her lumbar ESI.  Requesting office is asking for 7 days.  Her PMH includes coronary artery disease status post PCI with DES to her distal RCA in 2010, LHC 2015 showed 70-80% stenosed ostial RCA with patent distal RCA stent, stress testing 1/24 showed low risk, severe AS status post TAVR 12/15.  She also has moderate mitral stenosis, hypertension and hyperlipidemia.  She was last seen and evaluated by [name] on 4/25.  She was doing well at that time.  She reports she is able to complete greater than 4 METS of physical activity.  May her Plavix  be held for 7 days prior to her injection?  Thank you for your help.  Please direct your response to CV DIV preop pool.  Josefa HERO. Jun Rightmyer NP-C     08/30/2024, 10:55 AM [facility] [address] [place], [place] [address] Office [phone]  '

## 2024-08-30 NOTE — Telephone Encounter (Signed)
     Primary Cardiologist: Lonni Cash, MD  Chart reviewed as part of pre-operative protocol coverage. Given past medical history and time since last visit, based on ACC/AHA guidelines, CLOTILE WHITTINGTON would be at acceptable risk for the planned procedure without further cardiovascular testing.   Her clopidogrel  may be held for 7 days prior to her procedure.  We would recommend that she continue her aspirin  throughout the procedure if possible.  If surgical risk is felt to be too high, aspirin  may be held for 5-7 days prior to her procedure.  Please resume as soon as hemostasis is achieved  I will route this recommendation to the requesting party via Epic fax function and remove from pre-op pool.  Please call with questions.  Josefa HERO. Hagan Vanauken NP-C     08/30/2024, 11:36 AM [facility] [address] [place], [place] [address] Office [phone]

## 2024-08-30 NOTE — Telephone Encounter (Signed)
 Nikki with [facility] called in stating they received clearance, however their office protocol requires pt hold Plavix  for 7 days and the clearance says 5 days. She asked if this can be reevaluated or discussed. Please advise.

## 2024-09-06 ENCOUNTER — Ambulatory Visit: Payer: Self-pay

## 2024-09-06 NOTE — Telephone Encounter (Signed)
 FYI Only or Action Required?: FYI only for provider.  Patient was last seen in primary care on 06/28/2024 by Renne Homans, MD.  Called Nurse Triage reporting Dizziness. - vision went dark for a short amount of time. Pt reports normal BP  - cannot test blood sugar.  Symptoms began several days ago.  Interventions attempted: Nothing.  Symptoms are: gradually worsening.  Triage Disposition: See Physician Within 24 Hours  Patient/caregiver understands and will follow disposition?: Yes                Copied from CRM [id number]. Topic: Clinical - Red Word Triage >> Sep 06, 2024 12:11 PM Carrielelia G wrote: Red Word that prompted transfer to Nurse Triage: dizziness, off balance. Black out spell yesterday Reason for Disposition  [1] MODERATE dizziness (e.g., interferes with normal activities) AND [2] has NOT been evaluated by doctor (or NP/PA) for this  (Exception: Dizziness caused by heat exposure, sudden standing, or poor fluid intake.)  Answer Assessment - Initial Assessment Questions 1. DESCRIPTION: Describe your dizziness.     Stood up and vision went dark.  2. LIGHTHEADED: Do you feel lightheaded? (e.g., somewhat faint, woozy, weak upon standing)     Each times she stands - lasting for 5-10 minutes 3. VERTIGO: Do you feel like either you or the room is spinning or tilting? (i.e., vertigo)     no 4. SEVERITY: How bad is it?  Do you feel like you are going to faint? Can you stand and walk?     yes 5. ONSET:  When did the dizziness begin?     2-3 days 6. AGGRAVATING FACTORS: Does anything make it worse? (e.g., standing, change in head position)     Stood up 7. HEART RATE: Can you tell me your heart rate? How many beats in 15 seconds?  (Note: Not all patients can do this.)       no 8. CAUSE: What do you think is causing the dizziness? (e.g., decreased fluids or food, diarrhea, emotional distress, heat exposure, new medicine, sudden standing, vomiting;  unknown)     unsure 9. RECURRENT SYMPTOM: Have you had dizziness before? If Yes, ask: When was the last time? What happened that time?     yes 10. OTHER SYMPTOMS: Do you have any other symptoms? (e.g., fever, chest pain, vomiting, diarrhea, bleeding)       No - h  Protocols used: Dizziness - Lightheadedness-A-AH

## 2024-09-06 NOTE — Telephone Encounter (Signed)
 Pt's appt is schedule with Dr Shawn tomorrow 09/07/24 @0945  AM..

## 2024-09-07 ENCOUNTER — Other Ambulatory Visit: Payer: Self-pay

## 2024-09-07 ENCOUNTER — Ambulatory Visit (INDEPENDENT_AMBULATORY_CARE_PROVIDER_SITE_OTHER)

## 2024-09-07 VITALS — BP 135/74 | HR 85 | Temp 98.1°F | Ht 60.0 in | Wt 265.6 lb

## 2024-09-07 DIAGNOSIS — F331 Major depressive disorder, recurrent, moderate: Secondary | ICD-10-CM

## 2024-09-07 DIAGNOSIS — E1159 Type 2 diabetes mellitus with other circulatory complications: Secondary | ICD-10-CM

## 2024-09-07 DIAGNOSIS — R42 Dizziness and giddiness: Secondary | ICD-10-CM | POA: Diagnosis not present

## 2024-09-07 DIAGNOSIS — Z23 Encounter for immunization: Secondary | ICD-10-CM | POA: Diagnosis not present

## 2024-09-07 DIAGNOSIS — J449 Chronic obstructive pulmonary disease, unspecified: Secondary | ICD-10-CM

## 2024-09-07 DIAGNOSIS — I5032 Chronic diastolic (congestive) heart failure: Secondary | ICD-10-CM

## 2024-09-07 DIAGNOSIS — E119 Type 2 diabetes mellitus without complications: Secondary | ICD-10-CM

## 2024-09-07 DIAGNOSIS — E785 Hyperlipidemia, unspecified: Secondary | ICD-10-CM

## 2024-09-07 DIAGNOSIS — Z95811 Presence of heart assist device: Secondary | ICD-10-CM | POA: Diagnosis not present

## 2024-09-07 DIAGNOSIS — I152 Hypertension secondary to endocrine disorders: Secondary | ICD-10-CM

## 2024-09-07 MED ORDER — COVID-19 MRNA VAC-TRIS(PFIZER) 30 MCG/0.3ML IM SUSY: 0.3000 mL | PREFILLED_SYRINGE | Freq: Once | INTRAMUSCULAR | 0 refills | Status: AC

## 2024-09-07 NOTE — Assessment & Plan Note (Signed)
 Orthostatics negative in office today, CN exam intact and normal strength b/l. Suspect her symptoms are normal phenomena, perhaps relative hypotension upon standing with mild venous stasis preventing optimal compensatory mechanisms. Lower suspicion for vertigo, cardiac etiology, stroke/amaurosis fugax or innate ocular issue given b/l nature and association with standing.  - Encouraged hydration, compression stockings, taking time to stand up to prevent falls. - we discussed a possible role of medication effects as she is on several centrally-acting medications. These have all preceded her symptoms however given pill burden and the potential interactions of these medications, we agreed that it would be in her best interest to discontinue medications as able. Plan to stop gabapentin  300 mg every day and monitor her pain.

## 2024-09-07 NOTE — Assessment & Plan Note (Signed)
 Continue buspar , duloxetine 

## 2024-09-07 NOTE — Patient Instructions (Addendum)
 Thank you for coming in today. If you have any questions or concerns, please feel free to contact me via MyChart or call the office.   Medication changes:  - STOP amlodipine . Check your blood pressures. If you are having blood pressure values > 140/90 after 1 week, please let me know and I will increase the dose of one of your other blood pressure medications  - STOP gabapentin . If your pain worsens, you may restart this medication. - STOP metformin . We will recheck your A1c in 3 months.   It is important to stay hydrated and sit up in bed before standing up too quickly.   If you have gotten labs today, I will be in touch via MyChart or telephone.   Have a great day.

## 2024-09-07 NOTE — Progress Notes (Signed)
 Subjective:   Patient ID: [name] female   DOB: 01-05-1961 63 y.o.   MRN: [id number]  HPI: [name] is a 63 y.o. HFpEF, COPD, CAD, depression, AS s/p TAVR, T2DM, HTN who is presenting for 2 weeks of positional dizziness.   Dizziness - Over the last 2 weeks, occasionally has episodes of dizziness (~1x every day) when she stands up that is accompanied with a 'head rush' and momentary darkening of vision (confirmed binocular vision loss). These episodes last a few seconds. She has had these episodes before but appear to feel more frequent lately. Denies vertigo, associated chest pain, sob, palpitations, tinnitus. Of note, has been having some visual difficulties 2/2 cataracts however this improved after having R cataract surgery and is planning on also have L cataract surgery soon. Denies new medications, has been checking her BP's at home and they have been reportedly normal (SBP in 130's). No changes to diet, urinary habits, H2O consumption, stress.   Depression - well controlled on buspar  and duloxetine , finds sxs manageable   HFpEF - not on diuresis, not on SGLT2i d/t recurrent UTI. She has not been having any difficulty with dyspnea/chest pain with exertion, no LE edema, no PND.   COPD - sxs well controlled on Trelegy every day, uses rescue inhaler only 1-2x every few weeks  T2DM - reports significant weight loss on Wegovy , has been off Wegovy  for a few weeks while she has been undergoing cataract surgeries but is planning on going back on it soon.   Allergies - on montelukast , her dog is a possible trigger  Past Medical History:  Diagnosis Date   Anemia    Anxiety    Aortic valve stenosis, severe    Arthritis    PAIN AND SEVERE OA LEFT KNEE ; S/P RIGHT TKA ON 02/03/12; HAS LOWER BACK PAIN-UNABLE TO STAND MORE THAN 10 MIN; ARTHRITIS ALL OVER   Asthma    Blood transfusion    2013[facility]   Breast cancer in female [facility])    Right   CAD (coronary artery disease)     Cath 2010 with DES x 1 RCA-- PT'S CARDIOLOGIST IS DR. MCALHANY   Chronic diastolic congestive heart failure (HCC)    COPD (chronic obstructive pulmonary disease) (HCC)    Mild   COPD exacerbation (HCC) 11/16/2022   Depression    Diabetes mellitus DIAGNOSED IN2010   Dyspnea    with much ambulation   Eczema    on back   GERD (gastroesophageal reflux disease)    Headache    migraines younger- rare now 02/07/21   Heart murmur    no current problems   History of hiatal hernia    History of kidney stones    passed or blasted   History of UTI 12/05/2022   Hyperlipidemia    Hypertension    Morbid obesity with body mass index of 60.0-69.9 in adult [facility])    Myocardial infarction (HCC)    PT THINKS SHE WAS DX WITH MI AT THE TIME OF HEART STENTING   Neuromuscular disorder (HCC)    bilateral arm/hands   Normocytic anemia 02/08/2012   Oxygen dependent    uses 3L oxygen night/prn   Personal history of radiation therapy    Pneumonia    Pulmonary embolism (HCC) 02/08/2012   S/P RT TOTAL KNEE ON 02/03/12--ON 02/08/12--DEVELOPED ACUTE SOB AND CHEST PAIN--AND DIAGNOSED WITH  PULMONARY EMBOLUS AND PNEUMONIA   Restless leg syndrome    Sleep apnea  Type 2 diabetes mellitus without complication, without long-term current use of insulin  (HCC) 02/08/2012   Uterine fibroid    NO PROBLEMS AT PRESENT FROM THE FIBROIDS-STATES SHE IS POST MENOPAUSAL-LAST MENSES 2010 EXCEPT FOR EPISODE THIS YR OF BLEEDING RELATED TO FIBROIDS.   Weakness    BOTH HANDS - S/P BILATERAL CARPAL TUNNEL RELEASE--BUT STILL HAS WEAKNESS--OFTEN DROPS THINGS   Current Outpatient Medications  Medication Sig Dispense Refill   acetaminophen  (TYLENOL ) 500 MG tablet Take 1,000 mg by mouth every 6 (six) hours as needed for moderate pain.     amLODipine  (NORVASC ) 5 MG tablet Take 1 tablet (5 mg total) by mouth daily. 90 tablet 2   amoxicillin -clavulanate (AUGMENTIN ) 875-125 MG tablet Take 1 tablet by mouth every 12 (twelve) hours. 14  tablet 0   aspirin  EC 81 MG tablet Take 1 tablet (81 mg total) by mouth daily. Swallow whole. 30 tablet 12   atorvastatin  (LIPITOR ) 80 MG tablet Take 1 tablet (80 mg total) by mouth daily. 90 tablet 3   Blood Pressure Monitoring (BLOOD PRESSURE CUFF) MISC Please follow insert instructions. 1 each 0   BOOSTRIX  5-2.5-18.5 LF-MCG/0.5 injection Inject 0.5 mLs into the muscle once.     busPIRone  (BUSPAR ) 15 MG tablet Take 1 tablet (15 mg total) by mouth 2 (two) times daily. 60 tablet 3   clopidogrel  (PLAVIX ) 75 MG tablet Take 1 tablet (75 mg total) by mouth every morning. 90 tablet 3   COMIRNATY syringe Inject 0.3 mLs into the muscle once.     DULoxetine  (CYMBALTA ) 30 MG capsule Take 1 capsule (30 mg total) by mouth daily. 30 capsule 3   Evolocumab  (REPATHA  SURECLICK) 140 MG/ML SOAJ Inject 140 mg into the skin every 14 (fourteen) days. 6 mL 3   ezetimibe  (ZETIA ) 10 MG tablet Take 1 tablet by mouth once daily 90 tablet 3   Fluticasone -Umeclidin-Vilant (TRELEGY ELLIPTA ) 100-62.5-25 MCG/ACT AEPB Inhale 1 puff into the lungs daily. 28 each 3   gabapentin  (NEURONTIN ) 300 MG capsule TAKE 1 CAPSULE BY MOUTH IN THE MORNING, 1 CAPSULE IN THE AFTERNOON AND 3 CAPSULES AT BEDTIME 150 capsule 0   hydrOXYzine  (ATARAX ) 50 MG tablet Take 1 tablet (50 mg total) by mouth at bedtime as needed. 30 tablet 3   ketorolac  (ACULAR ) 0.5 % ophthalmic solution Place 1 drop into the right eye 4 (four) times daily.     losartan -hydrochlorothiazide  (HYZAAR) 50-12.5 MG tablet Take 1 tablet by mouth daily. 90 tablet 3   metFORMIN  (GLUCOPHAGE -XR) 500 MG 24 hr tablet Take 1 tablet by mouth twice daily 180 tablet 0   metoprolol  succinate (TOPROL -XL) 50 MG 24 hr tablet Take 1 tablet (50 mg total) by mouth daily. Take with or immediately following a meal.TAKE 1 TABLET BY MOUTH ONCE DAILY WITH MEALS OR  IMMEDIATELY  FOLLOWING  A  MEAL 90 tablet 2   montelukast  (SINGULAIR ) 10 MG tablet TAKE 1 TABLET BY MOUTH AT BEDTIME 90 tablet 0    ofloxacin (OCUFLOX) 0.3 % ophthalmic solution Place 1 drop into the right eye 4 (four) times daily.     prednisoLONE acetate (PRED FORTE) 1 % ophthalmic suspension Place 1 drop into the right eye 4 (four) times daily.     traZODone  (DESYREL ) 150 MG tablet Take 1 tablet (150 mg total) by mouth at bedtime. 30 tablet 3   TWINRIX 720-20 ELU-MCG/ML injection Inject 1 mL into the muscle once.     VENTOLIN  HFA 108 (90 Base) MCG/ACT inhaler Inhale 2 puffs into the lungs  every 6 (six) hours as needed for wheezing or shortness of breath. 18 g 3   WEGOVY  2.4 MG/0.75ML SOAJ SQ injection INJECT 2.4 MG INTO THE SKIN ONCE WEEKLY 4 mL 0   No current facility-administered medications for this visit.   Family History  Problem Relation Age of Onset   Breast cancer Mother 48 - 32       stage IV at diagnosis   Emphysema Mother        smoked   Heart disease Mother    COPD Father        smoked   Asthma Father    Heart disease Father    Cancer Brother        Sinus   Colon cancer Neg Hx    Esophageal cancer Neg Hx    Inflammatory bowel disease Neg Hx    Liver disease Neg Hx    Pancreatic cancer Neg Hx    Rectal cancer Neg Hx    Stomach cancer Neg Hx    Social History   Socioeconomic History   Marital status: Married    Spouse name: Not on file   Number of children: 2   Years of education: Not on file   Highest education level: Not on file  Occupational History   Occupation: Disabled  Tobacco Use   Smoking status: Former    Current packs/day: 0.00    Average packs/day: 1.5 packs/day for 30.0 years (45.0 ttl pk-yrs)    Types: Cigarettes    Start date: 12/29/1970    Quit date: 12/29/2000    Years since quitting: 23.7   Smokeless tobacco: Never  Vaping Use   Vaping status: Never Used  Substance and Sexual Activity   Alcohol use: Not Currently   Drug use: Not Currently    Types: Marijuana    Comment: CBD oils through vape shops   Sexual activity: Not Currently    Birth control/protection:  Surgical, Post-menopausal    Comment: tubal ligation  Other Topics Concern   Not on file  Social History Narrative   Not on file   Social Drivers of Health   Financial Resource Strain: Low Risk  (06/28/2024)   Overall Financial Resource Strain (CARDIA)    Difficulty of Paying Living Expenses: Not hard at all  Food Insecurity: No Food Insecurity (06/29/2024)   Hunger Vital Sign    Worried About Running Out of Food in the Last Year: Never true    Ran Out of Food in the Last Year: Never true  Transportation Needs: No Transportation Needs (06/29/2024)   PRAPARE - Administrator, Civil Service (Medical): No    Lack of Transportation (Non-Medical): No  Physical Activity: Inactive (01/18/2024)   Exercise Vital Sign    Days of Exercise per Week: 0 days    Minutes of Exercise per Session: 0 min  Stress: No Stress Concern Present (03/17/2024)   [employer] of Occupational Health - Occupational Stress Questionnaire    Feeling of Stress : Only a little  Social Connections: Moderately Integrated (06/28/2024)   Social Connection and Isolation Panel    Frequency of Communication with Friends and Family: More than three times a week    Frequency of Social Gatherings with Friends and Family: Twice a week    Attends Religious Services: More than 4 times per year    Active Member of [employer] or Organizations: Not on file    Attends Banker Meetings: Never    Marital Status: Married  Review of Systems: Pertinent items are noted in HPI. Objective:   Vitals:   09/07/24 0932  BP: 135/74  Pulse: 85  Temp: 98.1 F (36.7 C)  TempSrc: Oral  SpO2: 93%  Weight: 265 lb 9.6 oz (120.5 kg)  Height: 5' (1.524 m)    Physical Exam: GEN: Well appearing, NAD. Oriented x 3, normal mood and affect  HEENT: Normocephalic, atraumatic, Conjunctiva clear, sclera non-icteric, EOM intact CV: RRR PULM: CTAB MSK: b/l LE wnl NEURO: CN II-XII grossly intact, 5/5 strength in b/l UE and  LE PSYCH: Oriented X3, intact recent and remote memory, judgment and insight, normal mood and affect.   Assessment & Plan:   Assessment & Plan Positional lightheadedness Orthostatics negative in office today, CN exam intact and normal strength b/l. Suspect her symptoms are normal phenomena, perhaps relative hypotension upon standing with mild venous stasis preventing optimal compensatory mechanisms. Lower suspicion for vertigo, cardiac etiology, stroke/amaurosis fugax or innate ocular issue given b/l nature and association with standing.  - Encouraged hydration, compression stockings, taking time to stand up to prevent falls. - we discussed a possible role of medication effects as she is on several centrally-acting medications. These have all preceded her symptoms however given pill burden and the potential interactions of these medications, we agreed that it would be in her best interest to discontinue medications as able. Plan to stop gabapentin  300 mg every day and monitor her pain.   Hypertension associated with diabetes (HCC) Blood pressure well controlled on losartan -hydrochlorothiazide  and amlodipine  and metoprolol . Orthostatics negative in office today. She reports significant pill burden which has been impacting her QoL.  - stop amlodipine  today, continue metoprolol  succinate 50 mg and losartan -hydrochlorothiazide  50-12.5. Of note, she has room to uptitrate both metoprolol  or hyzaar and so if her BP becomes uncontrolled off the amlodipine , we will uptitrate the medications she is already on rather than adding a third agent. Chronic diastolic congestive heart failure (HCC) Euvolemic on exam today, asymptomatic. SGLT2i contraindicated d/t recurrent UTI. Not on MRA.  - cont losartan -hydrochlorothiazide   Chronic obstructive pulmonary disease, unspecified COPD type (HCC) Well controlled on Trelegy every day, uses rescue inhaler 1-2x every few weeks.  Major depressive disorder, recurrent  episode, moderate (HCC) Continue buspar , duloxetine  Encounter for immunization Flu shot given today  Type 2 diabetes mellitus without complication, without long-term current use of insulin  (HCC) A1c 6.1 today. Currently on wegovy  2.4 mg and metformin . Contraindicated SGLT2i's d/t frequent UTIs.  - stop metformin  today, as pt continues to lose weight on wegovy  and A1c continues to improve.  Hyperlipidemia LDL goal <70 - Check lipid panel today - cont zetia , statin, repatha    Jone Dauphin MD

## 2024-09-07 NOTE — Assessment & Plan Note (Addendum)
 Blood pressure well controlled on losartan -hydrochlorothiazide  and amlodipine  and metoprolol . Orthostatics negative in office today. She reports significant pill burden which has been impacting her QoL.  - stop amlodipine  today, continue metoprolol  succinate 50 mg and losartan -hydrochlorothiazide  50-12.5. Of note, she has room to uptitrate both metoprolol  or hyzaar and so if her BP becomes uncontrolled off the amlodipine , we will uptitrate the medications she is already on rather than adding a third agent.

## 2024-09-07 NOTE — Assessment & Plan Note (Signed)
 Well controlled on Trelegy every day, uses rescue inhaler 1-2x every few weeks.

## 2024-09-08 ENCOUNTER — Ambulatory Visit: Payer: Self-pay

## 2024-09-08 ENCOUNTER — Encounter: Payer: Self-pay | Admitting: Cardiovascular Disease

## 2024-09-08 ENCOUNTER — Ambulatory Visit: Attending: Cardiovascular Disease | Admitting: Cardiovascular Disease

## 2024-09-08 VITALS — BP 133/79 | HR 85 | Ht 60.0 in | Wt 263.8 lb

## 2024-09-08 DIAGNOSIS — I10 Essential (primary) hypertension: Secondary | ICD-10-CM

## 2024-09-08 DIAGNOSIS — I2510 Atherosclerotic heart disease of native coronary artery without angina pectoris: Secondary | ICD-10-CM

## 2024-09-08 DIAGNOSIS — I350 Nonrheumatic aortic (valve) stenosis: Secondary | ICD-10-CM | POA: Diagnosis not present

## 2024-09-08 DIAGNOSIS — I342 Nonrheumatic mitral (valve) stenosis: Secondary | ICD-10-CM

## 2024-09-08 LAB — COMPREHENSIVE METABOLIC PANEL WITH GFR
ALT: 10 IU/L (ref 0–32)
AST: 15 IU/L (ref 0–40)
Albumin: 4.4 g/dL (ref 3.9–4.9)
Alkaline Phosphatase: 76 IU/L (ref 44–121)
BUN/Creatinine Ratio: 14 (ref 12–28)
BUN: 11 mg/dL (ref 8–27)
Bilirubin Total: 1 mg/dL (ref 0.0–1.2)
CO2: 23 mmol/L (ref 20–29)
Calcium: 9.7 mg/dL (ref 8.7–10.3)
Chloride: 99 mmol/L (ref 96–106)
Creatinine, Ser: 0.81 mg/dL (ref 0.57–1.00)
Globulin, Total: 2.3 g/dL (ref 1.5–4.5)
Glucose: 111 mg/dL — ABNORMAL HIGH (ref 70–99)
Potassium: 4.4 mmol/L (ref 3.5–5.2)
Sodium: 144 mmol/L (ref 134–144)
Total Protein: 6.7 g/dL (ref 6.0–8.5)
eGFR: 81 mL/min/1.73 (ref >59)

## 2024-09-08 LAB — LIPID PANEL
Chol/HDL Ratio: 2.8 ratio (ref 0.0–4.4)
Cholesterol, Total: 170 mg/dL (ref 100–199)
HDL: 61 mg/dL (ref >39)
LDL Chol Calc (NIH): 87 mg/dL (ref 0–99)
Triglycerides: 126 mg/dL (ref 0–149)
VLDL Cholesterol Cal: 22 mg/dL (ref 5–40)

## 2024-09-08 LAB — MICROALBUMIN / CREATININE URINE RATIO
Creatinine, Urine: 86.7 mg/dL
Microalb/Creat Ratio: 52 mg/g{creat} — ABNORMAL HIGH (ref 0–29)
Microalbumin, Urine: 44.7 ug/mL

## 2024-09-08 NOTE — Patient Instructions (Signed)
 Medication Instructions:  Your physician recommends that you continue on your current medications as directed. Please refer to the Current Medication list given to you today.  *If you need a refill on your cardiac medications before your next appointment, please call your pharmacy*  Lab Work: none If you have labs (blood work) drawn today and your tests are completely normal, you will receive your results only by: MyChart Message (if you have MyChart) OR A paper copy in the mail If you have any lab test that is abnormal or we need to change your treatment, we will call you to review the results.  Testing/Procedures: none  Follow-Up: At [facility], you and your health needs are our priority.  As part of our continuing mission to provide you with exceptional heart care, our providers are all part of one team.  This team includes your primary Cardiologist (physician) and Advanced Practice Providers or APPs (Physician Assistants and Nurse Practitioners) who all work together to provide you with the care you need, when you need it.  Your next appointment:   12 month(s)  Provider:   Lonni Cash, MD    We recommend signing up for the patient portal called MyChart.  Sign up information is provided on this After Visit Summary.  MyChart is used to connect with patients for Virtual Visits (Telemedicine).  Patients are able to view lab/test results, encounter notes, upcoming appointments, etc.  Non-urgent messages can be sent to your provider as well.   To learn more about what you can do with MyChart, go to [email or web address].   Other Instructions

## 2024-09-08 NOTE — Progress Notes (Signed)
 Chief Complaint  Patient presents with   Follow-up    CAD, Aortic valve disease   History of Present Illness: 63 yo female with history of severe aortic stenosis now s/p TAVR,  CAD, OSA, HTN, HLD, obesity, GERD, asthma, COPD, former tobacco abuse, depression here today for cardiac follow up. She had a cardiac cath in February 2010 and had a drug eluting stent (2.87mm Xience) placed in the distal RCA. Her LAD and Circumflex were free of disease. She had her right knee replaced in February 2013, complicated by a PE. She was started on coumadin  and treated for 6 months. CTA chest on 08/09/12 showed no evidence of PE so her coumadin  was stopped. I saw her in February 2014 and she c/o severe dyspnea with minimal exertion. I felt that this was multi-factorial. Chest CTA with no PE. I arranged a stress myoview  which showed possible ischemia. Echo with severe AS, normal LV function. Cardiac cath on 03/17/13 with mild non-obstructive disease and patent stent RCA. She continued to have severe dyspnea. Most recent cath 09/14/14 with 70% ostial RCA stenosis treated medically and no obstructive disease in the LAD or Circumflex. She underwent TAVR on 12/12/14 and did well. (26 mm Sapien 3 bioprosthetic valve). Nuclear stress test January 2025 with no ischemia. Echo February 2025 with LVEF=60-65%, normal RV function. Moderate mitral stenosis. Severe MAC. Normally functioning AVR.    She is here today for follow up. The patient denies any chest pain, palpitations, lower extremity edema, orthopnea, PND, dizziness, near syncope or syncope. She has lost over 100  lbs over the past year. Baseline dyspnea without change.     Primary Care Physician: Edgardo Pontiff, DO  Past Medical History:  Diagnosis Date   Anemia    Anxiety    Aortic valve stenosis, severe    Arthritis    PAIN AND SEVERE OA LEFT KNEE ; S/P RIGHT TKA ON 02/03/12; HAS LOWER BACK PAIN-UNABLE TO STAND MORE THAN 10 MIN; ARTHRITIS ALL OVER   Asthma     Blood transfusion    2013[facility]   Breast cancer in female [facility])    Right   CAD (coronary artery disease)    Cath 2010 with DES x 1 RCA-- PT'S CARDIOLOGIST IS DR. Ayyan Sites   Chronic diastolic congestive heart failure (HCC)    COPD (chronic obstructive pulmonary disease) (HCC)    Mild   COPD exacerbation (HCC) 11/16/2022   Depression    Diabetes mellitus DIAGNOSED IN2010   Dyspnea    with much ambulation   Eczema    on back   GERD (gastroesophageal reflux disease)    Headache    migraines younger- rare now 02/07/21   Heart murmur    no current problems   History of hiatal hernia    History of kidney stones    passed or blasted   History of UTI 12/05/2022   Hyperlipidemia    Hypertension    Morbid obesity with body mass index of 60.0-69.9 in adult [facility])    Myocardial infarction (HCC)    PT THINKS SHE WAS DX WITH MI AT THE TIME OF HEART STENTING   Neuromuscular disorder (HCC)    bilateral arm/hands   Normocytic anemia 02/08/2012   Oxygen dependent    uses 3L oxygen night/prn   Personal history of radiation therapy    Pneumonia    Pulmonary embolism (HCC) 02/08/2012   S/P RT TOTAL KNEE ON 02/03/12--ON 02/08/12--DEVELOPED ACUTE SOB AND CHEST PAIN--AND DIAGNOSED WITH  PULMONARY EMBOLUS AND PNEUMONIA   Restless leg syndrome    Sleep apnea    Type 2 diabetes mellitus without complication, without long-term current use of insulin  (HCC) 02/08/2012   Uterine fibroid    NO PROBLEMS AT PRESENT FROM THE FIBROIDS-STATES SHE IS POST MENOPAUSAL-LAST MENSES 2010 EXCEPT FOR EPISODE THIS YR OF BLEEDING RELATED TO FIBROIDS.   Weakness    BOTH HANDS - S/P BILATERAL CARPAL TUNNEL RELEASE--BUT STILL HAS WEAKNESS--OFTEN DROPS THINGS    Past Surgical History:  Procedure Laterality Date   BACK SURGERY  02/11/2021   Dr Lucilla   BICEPT TENODESIS Right 09/15/2023   Procedure: BICEPS TENODESIS;  Surgeon: Addie Cordella Hamilton, MD;  Location: [facility] OR;  Service: Orthopedics;  Laterality: Right;   BREAST  BIOPSY Right 06/04/2018   BREAST LUMPECTOMY Right 06/2018   BREAST LUMPECTOMY WITH RADIOACTIVE SEED AND SENTINEL LYMPH NODE BIOPSY Right 07/19/2018   Procedure: BREAST LUMPECTOMY WITH RADIOACTIVE SEED AND SENTINEL LYMPH NODE BIOPSY;  Surgeon: Ethyl Lenis, MD;  Location: [facility] OR;  Service: General;  Laterality: Right;   CARPAL TUNNEL RELEASE     Bilateral   CHOLECYSTECTOMY     COLONOSCOPY  2010   CORONARY ANGIOPLASTY     2010 has stent in place   CYSTOSCOPY W/ RETROGRADES Right 09/21/2013   Procedure: CYSTOSCOPY WITH RIGHT RETROGRADE PYELOGRAM RIGHT DOUBLE J STENT ;  Surgeon: Donnice Gwenyth Brooks, MD;  Location: [facility] ORS;  Service: Urology;  Laterality: Right;   CYSTOSCOPY W/ URETERAL STENT PLACEMENT Right 11/16/2022   Procedure: CYSTOSCOPY WITH RETROGRADE PYELOGRAM/URETERAL STENT PLACEMENT;  Surgeon: Lovie Arlyss CROME, MD;  Location: [facility] OR;  Service: Urology;  Laterality: Right;   CYSTOSCOPY WITH RETROGRADE PYELOGRAM, URETEROSCOPY AND STENT PLACEMENT Right 01/13/2023   Procedure: CYSTOSCOPY WITH RETROGRADE PYELOGRAM, URETEROSCOPY AND STENT EXCHANGE;  Surgeon: Roseann Adine PARAS., MD;  Location: [facility] ORS;  Service: Urology;  Laterality: Right;   CYSTOSCOPY WITH URETEROSCOPY AND STENT PLACEMENT Right 10/25/2013   Procedure: CYSTOSCOPY RIGHT URETEROSCOPY HOLMIUM LASER LITHO AND STENT PLACEMENT;  Surgeon: Donnice Gwenyth Brooks, MD;  Location: [facility]S;  Service: Urology;  Laterality: Right;   HERNIA REPAIR  1995   umbilical ,   HOLMIUM LASER APPLICATION Right 01/13/2023   Procedure: HOLMIUM LASER APPLICATION;  Surgeon: Roseann Adine PARAS., MD;  Location: [facility]S;  Service: Urology;  Laterality: Right;   INTRAOPERATIVE TRANSESOPHAGEAL ECHOCARDIOGRAM N/A 12/12/2014   Procedure: INTRAOPERATIVE TRANSESOPHAGEAL ECHOCARDIOGRAM;  Surgeon: Lonni JONETTA Cash, MD;  Location[facility] OR;  Service: Cardiovascular;  Laterality: N/A;   KNEE ARTHROPLASTY  02/03/2012   Procedure: COMPUTER ASSISTED TOTAL KNEE  ARTHROPLASTY;  Surgeon: Lonni CINDERELLA Poli, MD;  Location: [facility] OR;  Service: Orthopedics;  Laterality: Right;  Right total knee arthroplasty   LEFT AND RIGHT HEART CATHETERIZATION WITH CORONARY ANGIOGRAM N/A 03/17/2013   Procedure: LEFT AND RIGHT HEART CATHETERIZATION WITH CORONARY ANGIOGRAM;  Surgeon: Lonni JONETTA Cash, MD;  Location: [facility] CATH LAB;  Service: Cardiovascular;  Laterality: N/A;   LEFT AND RIGHT HEART CATHETERIZATION WITH CORONARY/GRAFT ANGIOGRAM N/A 09/14/2014   Procedure: LEFT AND RIGHT HEART CATHETERIZATION WITH EL BILE;  Surgeon: Lonni JONETTA Cash, MD;  Location: [facility] CATH LAB;  Service: Cardiovascular;  Laterality: N/A;   REVERSE SHOULDER ARTHROPLASTY Left 03/06/2022   Procedure: LEFT SHOULDER REPLACEMENT APPLICATION OF WOUND VAC;  Surgeon: Addie Cordella Hamilton, MD;  Location: [facility] OR;  Service: Orthopedics;  Laterality: Left;   REVERSE SHOULDER ARTHROPLASTY Right 09/15/2023   Procedure: REVERSE SHOULDER ARTHROPLASTY;  Surgeon: Addie Cordella Hamilton, MD;  Location:  [facility] OR;  Service: Orthopedics;  Laterality: Right;   TEE WITHOUT CARDIOVERSION N/A 03/14/2013   Procedure: TRANSESOPHAGEAL ECHOCARDIOGRAM (TEE);  Surgeon: Redell GORMAN Shallow, MD;  Location: [facility] ENDOSCOPY;  Service: Cardiovascular;  Laterality: N/A;   TEE WITHOUT CARDIOVERSION N/A 11/14/2014   Procedure: TRANSESOPHAGEAL ECHOCARDIOGRAM (TEE);  Surgeon: Redell GORMAN Shallow, MD;  Location: [facility] ENDOSCOPY;  Service: Cardiovascular;  Laterality: N/A;   TONSILLECTOMY     maybe as a child- does not know   TOTAL KNEE ARTHROPLASTY  09/10/2012   Procedure: TOTAL KNEE ARTHROPLASTY;  Surgeon: Lonni CINDERELLA Poli, MD;  Location: [facility] ORS;  Service: Orthopedics;  Laterality: Left;   TOTAL KNEE REVISION Right 07/15/2013   Procedure: REVISION ARTHROPLASTY RIGHT KNEE;  Surgeon: Lonni CINDERELLA Poli, MD;  Location: [facility] OR[facility];  Service: Orthopedics;  Laterality: Right;   TRANSCATHETER AORTIC VALVE REPLACEMENT, TRANSFEMORAL  N/A 12/12/2014   Procedure: TRANSCATHETER AORTIC VALVE REPLACEMENT, TRANSFEMORAL;  Surgeon: Lonni JONETTA Cash, MD;  Location: [facility] OR;  Service: Cardiovascular;  Laterality: N/A;   TRIGGER FINGER RELEASE  09/10/2012   Procedure: RELEASE TRIGGER FINGER/A-1 PULLEY;  Surgeon: Lonni CINDERELLA Poli, MD;  Location: [facility] ORS;  Service: Orthopedics;  Laterality: Right;  Right Ring Finger   TUBAL LIGATION      Current Outpatient Medications  Medication Sig Dispense Refill   acetaminophen  (TYLENOL ) 500 MG tablet Take 1,000 mg by mouth every 6 (six) hours as needed for moderate pain.     amLODipine  (NORVASC ) 5 MG tablet Take 1 tablet (5 mg total) by mouth daily. 90 tablet 2   aspirin  EC 81 MG tablet Take 1 tablet (81 mg total) by mouth daily. Swallow whole. 30 tablet 12   atorvastatin  (LIPITOR ) 80 MG tablet Take 1 tablet (80 mg total) by mouth daily. 90 tablet 3   Blood Pressure Monitoring (BLOOD PRESSURE CUFF) MISC Please follow insert instructions. 1 each 0   busPIRone  (BUSPAR ) 15 MG tablet Take 1 tablet (15 mg total) by mouth 2 (two) times daily. 60 tablet 3   clopidogrel  (PLAVIX ) 75 MG tablet Take 1 tablet (75 mg total) by mouth every morning. 90 tablet 3   DULoxetine  (CYMBALTA ) 30 MG capsule Take 1 capsule (30 mg total) by mouth daily. 30 capsule 3   Evolocumab  (REPATHA  SURECLICK) 140 MG/ML SOAJ Inject 140 mg into the skin every 14 (fourteen) days. 6 mL 3   ezetimibe  (ZETIA ) 10 MG tablet Take 1 tablet by mouth once daily 90 tablet 3   Fluticasone -Umeclidin-Vilant (TRELEGY ELLIPTA ) 100-62.5-25 MCG/ACT AEPB Inhale 1 puff into the lungs daily. 28 each 3   gabapentin  (NEURONTIN ) 300 MG capsule TAKE 1 CAPSULE BY MOUTH IN THE MORNING, 1 CAPSULE IN THE AFTERNOON AND 3 CAPSULES AT BEDTIME 150 capsule 0   hydrOXYzine  (ATARAX ) 50 MG tablet Take 1 tablet (50 mg total) by mouth at bedtime as needed. 30 tablet 3   ketorolac  (ACULAR ) 0.5 % ophthalmic solution Place 1 drop into the right eye 4 (four) times  daily.     losartan -hydrochlorothiazide  (HYZAAR) 50-12.5 MG tablet Take 1 tablet by mouth daily. 90 tablet 3   metFORMIN  (GLUCOPHAGE -XR) 500 MG 24 hr tablet Take 1 tablet by mouth twice daily 180 tablet 0   metoprolol  succinate (TOPROL -XL) 50 MG 24 hr tablet Take 1 tablet (50 mg total) by mouth daily. Take with or immediately following a meal.TAKE 1 TABLET BY MOUTH ONCE DAILY WITH MEALS OR  IMMEDIATELY  FOLLOWING  A  MEAL 90 tablet 2   montelukast  (SINGULAIR ) 10 MG tablet TAKE  1 TABLET BY MOUTH AT BEDTIME 90 tablet 0   ofloxacin (OCUFLOX) 0.3 % ophthalmic solution Place 1 drop into the right eye 4 (four) times daily.     prednisoLONE acetate (PRED FORTE) 1 % ophthalmic suspension Place 1 drop into the right eye 4 (four) times daily.     traZODone  (DESYREL ) 150 MG tablet Take 1 tablet (150 mg total) by mouth at bedtime. 30 tablet 3   VENTOLIN  HFA 108 (90 Base) MCG/ACT inhaler Inhale 2 puffs into the lungs every 6 (six) hours as needed for wheezing or shortness of breath. 18 g 3   WEGOVY  2.4 MG/0.75ML SOAJ SQ injection INJECT 2.4 MG INTO THE SKIN ONCE WEEKLY 4 mL 0   COMIRNATY syringe Inject 0.3 mLs into the muscle once.     TWINRIX 720-20 ELU-MCG/ML injection Inject 1 mL into the muscle once.     No current facility-administered medications for this visit.    No Known Allergies  Social History   Socioeconomic History   Marital status: Married    Spouse name: Not on file   Number of children: 2   Years of education: Not on file   Highest education level: Not on file  Occupational History   Occupation: Disabled  Tobacco Use   Smoking status: Former    Current packs/day: 0.00    Average packs/day: 1.5 packs/day for 30.0 years (45.0 ttl pk-yrs)    Types: Cigarettes    Start date: 12/29/1970    Quit date: 12/29/2000    Years since quitting: 23.7   Smokeless tobacco: Never  Vaping Use   Vaping status: Never Used  Substance and Sexual Activity   Alcohol use: Not Currently   Drug use: Not  Currently    Types: Marijuana    Comment: CBD oils through vape shops   Sexual activity: Not Currently    Birth control/protection: Surgical, Post-menopausal    Comment: tubal ligation  Other Topics Concern   Not on file  Social History Narrative   Not on file   Social Drivers of Health   Financial Resource Strain: Low Risk  (06/28/2024)   Overall Financial Resource Strain (CARDIA)    Difficulty of Paying Living Expenses: Not hard at all  Food Insecurity: No Food Insecurity (06/29/2024)   Hunger Vital Sign    Worried About Running Out of Food in the Last Year: Never true    Ran Out of Food in the Last Year: Never true  Transportation Needs: No Transportation Needs (06/29/2024)   PRAPARE - Administrator, Civil Service (Medical): No    Lack of Transportation (Non-Medical): No  Physical Activity: Inactive (01/18/2024)   Exercise Vital Sign    Days of Exercise per Week: 0 days    Minutes of Exercise per Session: 0 min  Stress: No Stress Concern Present (03/17/2024)   [employer] of Occupational Health - Occupational Stress Questionnaire    Feeling of Stress : Only a little  Social Connections: Moderately Integrated (06/28/2024)   Social Connection and Isolation Panel    Frequency of Communication with Friends and Family: More than three times a week    Frequency of Social Gatherings with Friends and Family: Twice a week    Attends Religious Services: More than 4 times per year    Active Member of [employer] or Organizations: Not on file    Attends Banker Meetings: Never    Marital Status: Married  Catering manager Violence: Not At Risk (06/29/2024)  Humiliation, Afraid, Rape, and Kick questionnaire    Fear of Current or Ex-Partner: No    Emotionally Abused: No    Physically Abused: No    Sexually Abused: No    Family History  Problem Relation Age of Onset   Breast cancer Mother 58 - 30       stage IV at diagnosis   Emphysema Mother        smoked    Heart disease Mother    COPD Father        smoked   Asthma Father    Heart disease Father    Cancer Brother        Sinus   Colon cancer Neg Hx    Esophageal cancer Neg Hx    Inflammatory bowel disease Neg Hx    Liver disease Neg Hx    Pancreatic cancer Neg Hx    Rectal cancer Neg Hx    Stomach cancer Neg Hx     Review of Systems:  As stated in the HPI and otherwise negative.   BP 133/79   Pulse 85   Ht 5' (1.524 m)   Wt 263 lb 12.8 oz (119.7 kg)   LMP 02/03/2012   SpO2 93%   BMI 51.52 kg/m   Physical Examination: General: Well developed, well nourished, NAD  HEENT: OP clear, mucus membranes moist  SKIN: warm, dry. No rashes. Neuro: No focal deficits  Musculoskeletal: Muscle strength 5/5 all ext  Psychiatric: Mood and affect normal  Neck: No JVD, no carotid bruits, no thyromegaly, no lymphadenopathy.  Lungs:Clear bilaterally, no wheezes, rhonci, crackles Cardiovascular: Regular rate and rhythm. No murmurs, gallops or rubs. Abdomen:Soft. Bowel sounds present. Non-tender.  Extremities: No lower extremity edema. Pulses are 2 + in the bilateral DP/PT.  EKG:  EKG is not ordered today. The ekg ordered today demonstrates    Recent Labs: 09/14/2023: Hemoglobin 13.1; Platelet Count 187 09/07/2024: ALT 10; BUN 11; Creatinine, Ser 0.81; Potassium 4.4; Sodium 144   Lipid Panel    Component Value Date/Time   CHOL 170 09/07/2024 1022   TRIG 126 09/07/2024 1022   HDL 61 09/07/2024 1022   CHOLHDL 2.8 09/07/2024 1022   CHOLHDL 3.7 12/28/2015 1052   VLDL 20 12/28/2015 1052   LDLCALC 87 09/07/2024 1022   LDLDIRECT 162.0 10/17/2013 0905     Wt Readings from Last 3 Encounters:  09/08/24 263 lb 12.8 oz (119.7 kg)  09/07/24 265 lb 9.6 oz (120.5 kg)  07/29/24 260 lb (117.9 kg)    Assessment and Plan:   1. Severe Aortic valve stenosis: She is s/p TAVR in December 2015. AVR working well by echo in February 2025. Continue ASA and Plavix . She is aware that she should use SBE  prophylaxis prior to select procedures including dental visits.    2. CAD without angina: Cardiac cath September 2015 with moderate ostial RCA stenosis and no disease in the LAD or Circumflex. No chest pain. Continue ASA, Plavix , beta blocker and statin.      3. Hyperlipidemia: LDL 97 in September 2025. She is on Repatha , Lipitor  and Zetia . We can't do much more for her lipids.       4. Morbid obesity: She is working on weight loss.   5. Mitral stenosis: Moderate by echo February 2025. Will follow  Labs/ tests ordered today include:  No orders of the defined types were placed in this encounter.  Disposition:   F/U with me in 12  months  Signed, Lonni Cash,  MD 09/08/2024 4:32 PM    [facility] [address] [place], [place], [place]  [address] Phone: [phone]; Fax: [phone]

## 2024-09-10 ENCOUNTER — Other Ambulatory Visit: Payer: Self-pay | Admitting: Student

## 2024-09-12 ENCOUNTER — Telehealth: Payer: Self-pay | Admitting: *Deleted

## 2024-09-12 NOTE — Telephone Encounter (Unsigned)
 Copied from CRM #[id number]. Topic: Clinical - Prescription Issue >> Sep 12, 2024 10:48 AM Farrel B wrote: Patient states that she has called the pharmacy in regards to her WEGOVY  2.4 MG/0.75ML SOAJ SQ injection, pharmacy states they were waiting on the provider signing off on the refill request please call and update patient

## 2024-09-13 ENCOUNTER — Inpatient Hospital Stay: Payer: Medicaid Other | Attending: Hematology and Oncology | Admitting: Hematology and Oncology

## 2024-09-13 VITALS — BP 153/85 | HR 81 | Temp 99.5°F | Resp 16 | Wt 159.3 lb

## 2024-09-13 DIAGNOSIS — C50411 Malignant neoplasm of upper-outer quadrant of right female breast: Secondary | ICD-10-CM | POA: Insufficient documentation

## 2024-09-13 DIAGNOSIS — Z1721 Progesterone receptor positive status: Secondary | ICD-10-CM | POA: Diagnosis not present

## 2024-09-13 DIAGNOSIS — Z170 Estrogen receptor positive status [ER+]: Secondary | ICD-10-CM | POA: Diagnosis not present

## 2024-09-13 DIAGNOSIS — Z1732 Human epidermal growth factor receptor 2 negative status: Secondary | ICD-10-CM | POA: Diagnosis not present

## 2024-09-13 DIAGNOSIS — Z79811 Long term (current) use of aromatase inhibitors: Secondary | ICD-10-CM | POA: Insufficient documentation

## 2024-09-13 DIAGNOSIS — Z923 Personal history of irradiation: Secondary | ICD-10-CM | POA: Diagnosis not present

## 2024-09-13 NOTE — Progress Notes (Signed)
 [facility]  Telephone:(336) [phone] Fax:(336) [phone]   ID: CLOA BUSHONG DOB: Jun 04, 1961  MR#: [id number]  RDW#:[id number]  Patient Care Team: Edgardo Pontiff, DO as PCP - General Verlin Lonni BIRCH, MD as PCP - Cardiology (Cardiology) Dewey Rush, MD as Consulting Physician (Radiation Oncology) Verlin Lonni BIRCH, MD as Consulting Physician (Cardiology) Crawford, Morna Pickle, NP as Nurse Practitioner (Hematology and Oncology) Ebbie Cough, MD as Consulting Physician (General Surgery) Susen Pastor, MD (Inactive) (Internal Medicine) Merlynn Lyle CROME, LCSW as [facility] Care Management (Licensed Clinical Social Worker) [facility] Arno Rosaline SQUIBB, RN as Registered Nurse  CHIEF COMPLAINT: Estrogen receptor positive breast cancer  CURRENT TREATMENT: Exemestane   INTERVAL HISTORY:  Discussed the use of AI scribe software for clinical note transcription with the patient, who gave verbal consent to proceed.  History of Present Illness [name] is a 63 year old female with history of breast cancer s.p 5 yrs of anti estrogen therapy who presents for follow-up after significant weight loss and to discuss urinary tract infections.  She has experienced significant weight loss, now weighing 259 pounds, having lost approximately 105 pounds in total. She is concerned about excess loose skin resulting from the weight loss.  She has a history of urinary tract infections (UTIs) that are now less frequent. She describes the UTIs as 'coming and going' but notes that they are not as frequent as they used to be.  She underwent cataract surgery recently, which she mentions as a significant recent medical event.  No new health issues have been reported, and she has not required hospitalization for any other conditions. She is due for a mammogram in December and has completed a five-year course of medication, which  she finished last year. No breast changes except for asymmetry, noting that one breast is longer than the other. She does not feel any lumps.  Rest of the pertinent 10 point ROS reviewed and neg.   COVID 19 VACCINATION STATUS: Pfizer x2, status post booster x1 as of August 2022   HISTORY OF CURRENT ILLNESS: On the original intake note:  [name] had routine screening mammography on 05/27/2018 showing a possible abnormality in the right breast. She underwent unilateral right  diagnostic mammography with tomography and right breast ultrasonography at [facility] on 06/02/2018 showing: breast density category B. An area of distortion is identified in the upper outer quadrant of the right breast mammographically. Ultrasonography of this area found an irregular hypoechoic mass at the 11 o'clock upper outer quadrant measuring 1.8 x 1.8 x 1.4 cm and located 6 cm from the nipple. The right axilla is negative sonographically for abnormal lymph nodes.   Accordingly on 06/04/2018 she proceeded to biopsy of the right breast area in question. The pathology from this procedure showed ([id number]): Invasive ductal carcinoma grade 1 measuring 1.0 cm in greatest extent, with microcalcifications. Ductal carcinoma in situ high grade. Prognostic indicators significant for: estrogen receptor, 100% positive and progesterone receptor, 100% positive, both with strong staining intensity. Proliferation marker Ki67 at 8%. HER2  not amplified with ratios HER2/CEP17 signals 1.32 and average HER2 copies per cell 1.85  The patient's subsequent history is as detailed below.   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; HAS LOWER BACK PAIN-UNABLE TO  STAND MORE THAN 10 MIN; ARTHRITIS ALL OVER   Asthma    Blood transfusion    2013[facility]   Breast cancer in female  [facility])    Right   CAD (coronary artery disease)    Cath 2010 with DES x 1 RCA-- PT'S CARDIOLOGIST IS DR. MCALHANY   Chronic diastolic congestive heart failure (HCC)    COPD (chronic obstructive pulmonary disease) (HCC)    Mild   COPD exacerbation (HCC) 11/16/2022   Depression    Diabetes mellitus DIAGNOSED IN2010   Dyspnea    with much ambulation   Eczema    on back   GERD (gastroesophageal reflux disease)    Headache    migraines younger- rare now 02/07/21   Heart murmur    no current problems   History of hiatal hernia    History of kidney stones    passed or blasted   History of UTI 12/05/2022   Hyperlipidemia    Hypertension    Morbid obesity with body mass index of 60.0-69.9 in adult [facility])    Myocardial infarction (HCC)    PT THINKS SHE WAS DX WITH MI AT THE TIME OF HEART STENTING   Neuromuscular disorder (HCC)    bilateral arm/hands   Normocytic anemia 02/08/2012   Oxygen dependent    uses 3L oxygen night/prn   Personal history of radiation therapy    Pneumonia    Pulmonary embolism (HCC) 02/08/2012   S/P RT TOTAL KNEE ON 02/03/12--ON 02/08/12--DEVELOPED ACUTE SOB AND CHEST PAIN--AND DIAGNOSED WITH  PULMONARY EMBOLUS AND PNEUMONIA   Restless leg syndrome    Sleep apnea    Type 2 diabetes mellitus without complication, without long-term current use of insulin  (HCC) 02/08/2012   Uterine fibroid    NO PROBLEMS AT PRESENT FROM THE FIBROIDS-STATES SHE IS POST MENOPAUSAL-LAST MENSES 2010 EXCEPT FOR EPISODE THIS YR OF BLEEDING RELATED TO FIBROIDS.   Weakness    BOTH HANDS - S/P BILATERAL CARPAL TUNNEL RELEASE--BUT STILL HAS WEAKNESS--OFTEN DROPS THINGS  Migraines at younger age. MI in 2009   PAST SURGICAL HISTORY: Past Surgical History:  Procedure Laterality Date   BACK SURGERY  02/11/2021   Dr Lucilla   BICEPT TENODESIS Right 09/15/2023   Procedure: BICEPS TENODESIS;  Surgeon: Addie Cordella Hamilton, MD;  Location: [facility] OR;  Service: Orthopedics;  Laterality: Right;   BREAST  BIOPSY Right 06/04/2018   BREAST LUMPECTOMY Right 06/2018   BREAST LUMPECTOMY WITH RADIOACTIVE SEED AND SENTINEL LYMPH NODE BIOPSY Right 07/19/2018   Procedure: BREAST LUMPECTOMY WITH RADIOACTIVE SEED AND SENTINEL LYMPH NODE BIOPSY;  Surgeon: Ethyl Lenis, MD;  Location: [facility] OR;  Service: General;  Laterality: Right;   CARPAL TUNNEL RELEASE     Bilateral   CHOLECYSTECTOMY     COLONOSCOPY  2010   CORONARY ANGIOPLASTY     2010 has stent in place   CYSTOSCOPY W/ RETROGRADES Right 09/21/2013   Procedure: CYSTOSCOPY WITH RIGHT RETROGRADE PYELOGRAM RIGHT DOUBLE J STENT ;  Surgeon: Donnice Gwenyth Brooks, MD;  Location: [facility] ORS;  Service: Urology;  Laterality: Right;   CYSTOSCOPY W/ URETERAL STENT PLACEMENT Right 11/16/2022   Procedure: CYSTOSCOPY WITH RETROGRADE PYELOGRAM/URETERAL STENT PLACEMENT;  Surgeon: Lovie Arlyss CROME, MD;  Location: [facility] OR;  Service: Urology;  Laterality: Right;   CYSTOSCOPY WITH RETROGRADE PYELOGRAM, URETEROSCOPY AND STENT PLACEMENT Right 01/13/2023   Procedure: CYSTOSCOPY WITH RETROGRADE PYELOGRAM, URETEROSCOPY AND STENT EXCHANGE;  Surgeon: Roseann Adine PARAS., MD;  Location: [facility]S;  Service: Urology;  Laterality: Right;  CYSTOSCOPY WITH URETEROSCOPY AND STENT PLACEMENT Right 10/25/2013   Procedure: CYSTOSCOPY RIGHT URETEROSCOPY HOLMIUM LASER LITHO AND STENT PLACEMENT;  Surgeon: Donnice Gwenyth Brooks, MD;  Location: [facility];  Service: Urology;  Laterality: Right;   HERNIA REPAIR  1995   umbilical ,   HOLMIUM LASER APPLICATION Right 01/13/2023   Procedure: HOLMIUM LASER APPLICATION;  Surgeon: Roseann Adine PARAS., MD;  Location: [facility];  Service: Urology;  Laterality: Right;   INTRAOPERATIVE TRANSESOPHAGEAL ECHOCARDIOGRAM N/A 12/12/2014   Procedure: INTRAOPERATIVE TRANSESOPHAGEAL ECHOCARDIOGRAM;  Surgeon: Lonni JONETTA Cash, MD;  Location: [facility] OR;  Service: Cardiovascular;  Laterality: N/A;   KNEE ARTHROPLASTY  02/03/2012   Procedure: COMPUTER ASSISTED TOTAL KNEE  ARTHROPLASTY;  Surgeon: Lonni CINDERELLA Poli, MD;  Location: [facility] OR;  Service: Orthopedics;  Laterality: Right;  Right total knee arthroplasty   LEFT AND RIGHT HEART CATHETERIZATION WITH CORONARY ANGIOGRAM N/A 03/17/2013   Procedure: LEFT AND RIGHT HEART CATHETERIZATION WITH CORONARY ANGIOGRAM;  Surgeon: Lonni JONETTA Cash, MD;  Location: [facility] CATH LAB;  Service: Cardiovascular;  Laterality: N/A;   LEFT AND RIGHT HEART CATHETERIZATION WITH CORONARY/GRAFT ANGIOGRAM N/A 09/14/2014   Procedure: LEFT AND RIGHT HEART CATHETERIZATION WITH EL BILE;  Surgeon: Lonni JONETTA Cash, MD;  Location: [facility] CATH LAB;  Service: Cardiovascular;  Laterality: N/A;   REVERSE SHOULDER ARTHROPLASTY Left 03/06/2022   Procedure: LEFT SHOULDER REPLACEMENT APPLICATION OF WOUND VAC;  Surgeon: Addie Cordella Hamilton, MD;  Location: [facility] OR;  Service: Orthopedics;  Laterality: Left;   REVERSE SHOULDER ARTHROPLASTY Right 09/15/2023   Procedure: REVERSE SHOULDER ARTHROPLASTY;  Surgeon: Addie Cordella Hamilton, MD;  Location: [facility] OR;  Service: Orthopedics;  Laterality: Right;   TEE WITHOUT CARDIOVERSION N/A 03/14/2013   Procedure: TRANSESOPHAGEAL ECHOCARDIOGRAM (TEE);  Surgeon: Redell GORMAN Shallow, MD;  Location: [facility] ENDOSCOPY;  Service: Cardiovascular;  Laterality: N/A;   TEE WITHOUT CARDIOVERSION N/A 11/14/2014   Procedure: TRANSESOPHAGEAL ECHOCARDIOGRAM (TEE);  Surgeon: Redell GORMAN Shallow, MD;  Location: [facility] ENDOSCOPY;  Service: Cardiovascular;  Laterality: N/A;   TONSILLECTOMY     maybe as a child- does not know   TOTAL KNEE ARTHROPLASTY  09/10/2012   Procedure: TOTAL KNEE ARTHROPLASTY;  Surgeon: Lonni CINDERELLA Poli, MD;  Location: [facility];  Service: Orthopedics;  Laterality: Left;   TOTAL KNEE REVISION Right 07/15/2013   Procedure: REVISION ARTHROPLASTY RIGHT KNEE;  Surgeon: Lonni CINDERELLA Poli, MD;  Location: [facility];  Service: Orthopedics;  Laterality: Right;   TRANSCATHETER AORTIC VALVE REPLACEMENT, TRANSFEMORAL  N/A 12/12/2014   Procedure: TRANSCATHETER AORTIC VALVE REPLACEMENT, TRANSFEMORAL;  Surgeon: Lonni JONETTA Cash, MD;  Location: [facility] OR;  Service: Cardiovascular;  Laterality: N/A;   TRIGGER FINGER RELEASE  09/10/2012   Procedure: RELEASE TRIGGER FINGER/A-1 PULLEY;  Surgeon: Lonni CINDERELLA Poli, MD;  Location: [facility];  Service: Orthopedics;  Laterality: Right;  Right Ring Finger   TUBAL LIGATION    Bovine Mitral Aortic valve surgery 2015   FAMILY HISTORY Family History  Problem Relation Age of Onset   Breast cancer Mother 74 - 23       stage IV at diagnosis   Emphysema Mother        smoked   Heart disease Mother    COPD Father        smoked   Asthma Father    Heart disease Father    Cancer Brother        Sinus   Colon cancer Neg Hx    Esophageal cancer Neg Hx    Inflammatory bowel disease Neg Hx  Liver disease Neg Hx    Pancreatic cancer Neg Hx    Rectal cancer Neg Hx    Stomach cancer Neg Hx   The patient's father died at age 53 due to emphysema and possibly cancer . The patient's mother died at age 29 due to breast cancer with stage IV disease at presentation. The patient has 2 full and 1 half brothers and 1 sister.  One of the patient's brothers had nasopharyngeal cancer stage IV diagnosed at age 32. There was also a paternal great grandfather with prostate cancer. The patient denies a family history of ovarian cancer.   GYNECOLOGIC HISTORY:  Patient's last menstrual period was 02/03/2012. Menarche: 63 years old Age at first live birth: 63 years old She is GX P2. Her LMP was around age 26. She used oral contraception in her 29's with no complications.    SOCIAL HISTORY:  Leora is disabled. Her husband, Aleene is retired from working in Designer, multimedia from [employer] , Air cabin crew, and smoke damage. The patient's son, Evalene lives in [place] and works as a Investment banker, operational.  He is getting married in 2022.  The patient's son Ozell 30 lives in [place] and is a Immunologist of a [employer].  He just bought a house (August 2022).  The patient has no grandchildren. She attends [employer].     ADVANCED DIRECTIVES: In the absence of any documents to the contrary the patient's husband is her healthcare power of attorney   HEALTH MAINTENANCE: Social History   Tobacco Use   Smoking status: Former    Current packs/day: 0.00    Average packs/day: 1.5 packs/day for 30.0 years (45.0 ttl pk-yrs)    Types: Cigarettes    Start date: 12/29/1970    Quit date: 12/29/2000    Years since quitting: 23.7   Smokeless tobacco: Never  Vaping Use   Vaping status: Never Used  Substance Use Topics   Alcohol use: Not Currently   Drug use: Not Currently    Types: Marijuana    Comment: CBD oils through vape shops     Colonoscopy: about 8 year ago  PAP: 10/20/2016 normal   Bone density: no   No Known Allergies  Current Outpatient Medications  Medication Sig Dispense Refill   acetaminophen  (TYLENOL ) 500 MG tablet Take 1,000 mg by mouth every 6 (six) hours as needed for moderate pain.     amLODipine  (NORVASC ) 5 MG tablet Take 1 tablet (5 mg total) by mouth daily. 90 tablet 2   aspirin  EC 81 MG tablet Take 1 tablet (81 mg total) by mouth daily. Swallow whole. 30 tablet 12   atorvastatin  (LIPITOR ) 80 MG tablet Take 1 tablet (80 mg total) by mouth daily. 90 tablet 3   Blood Pressure Monitoring (BLOOD PRESSURE CUFF) MISC Please follow insert instructions. 1 each 0   busPIRone  (BUSPAR ) 15 MG tablet Take 1 tablet (15 mg total) by mouth 2 (two) times daily. 60 tablet 3   clopidogrel  (PLAVIX ) 75 MG tablet Take 1 tablet (75 mg total) by mouth every morning. 90 tablet 3   COMIRNATY syringe Inject 0.3 mLs into the muscle once.     DULoxetine  (CYMBALTA ) 30 MG capsule Take 1 capsule (30 mg total) by mouth daily. 30 capsule 3   Evolocumab  (REPATHA  SURECLICK) 140 MG/ML SOAJ Inject 140 mg into the skin every 14 (fourteen) days. 6 mL 3   ezetimibe  (ZETIA ) 10 MG tablet  Take 1 tablet by mouth once daily 90 tablet 3  Fluticasone -Umeclidin-Vilant (TRELEGY ELLIPTA ) 100-62.5-25 MCG/ACT AEPB Inhale 1 puff into the lungs daily. 28 each 3   gabapentin  (NEURONTIN ) 300 MG capsule TAKE 1 CAPSULE BY MOUTH IN THE MORNING, 1 CAPSULE IN THE AFTERNOON AND 3 CAPSULES AT BEDTIME 150 capsule 0   hydrOXYzine  (ATARAX ) 50 MG tablet Take 1 tablet (50 mg total) by mouth at bedtime as needed. 30 tablet 3   ketorolac  (ACULAR ) 0.5 % ophthalmic solution Place 1 drop into the right eye 4 (four) times daily.     losartan -hydrochlorothiazide  (HYZAAR) 50-12.5 MG tablet Take 1 tablet by mouth daily. 90 tablet 3   metFORMIN  (GLUCOPHAGE -XR) 500 MG 24 hr tablet Take 1 tablet by mouth twice daily 180 tablet 0   metoprolol  succinate (TOPROL -XL) 50 MG 24 hr tablet Take 1 tablet (50 mg total) by mouth daily. Take with or immediately following a meal.TAKE 1 TABLET BY MOUTH ONCE DAILY WITH MEALS OR  IMMEDIATELY  FOLLOWING  A  MEAL 90 tablet 2   montelukast  (SINGULAIR ) 10 MG tablet TAKE 1 TABLET BY MOUTH AT BEDTIME 90 tablet 0   ofloxacin (OCUFLOX) 0.3 % ophthalmic solution Place 1 drop into the right eye 4 (four) times daily.     prednisoLONE acetate (PRED FORTE) 1 % ophthalmic suspension Place 1 drop into the right eye 4 (four) times daily.     traZODone  (DESYREL ) 150 MG tablet Take 1 tablet (150 mg total) by mouth at bedtime. 30 tablet 3   TWINRIX 720-20 ELU-MCG/ML injection Inject 1 mL into the muscle once.     VENTOLIN  HFA 108 (90 Base) MCG/ACT inhaler Inhale 2 puffs into the lungs every 6 (six) hours as needed for wheezing or shortness of breath. 18 g 3   WEGOVY  2.4 MG/0.75ML SOAJ SQ injection INJECT 2.4 MG INTO THE SKIN ONCE WEEKLY 4 mL 0   No current facility-administered medications for this visit.    OBJECTIVE: white woman who appears stated age  There were no vitals filed for this visit.     There is no height or weight on file to calculate BMI.   Wt Readings from Last 3 Encounters:   09/08/24 263 lb 12.8 oz (119.7 kg)  09/07/24 265 lb 9.6 oz (120.5 kg)  07/29/24 260 lb (117.9 kg)     ECOG FS:2 - Symptomatic, <50% confined to bed  Sclerae unicteric, EOMs intact Wearing a mask No cervical or supraclavicular adenopathy Breasts: Bilateral breast examined.  No concern for recurrence or regional adenopathy  LAB RESULTS:  CMP     Component Value Date/Time   NA 144 09/07/2024 1022   K 4.4 09/07/2024 1022   CL 99 09/07/2024 1022   CO2 23 09/07/2024 1022   GLUCOSE 111 (H) 09/07/2024 1022   GLUCOSE 139 (H) 09/14/2023 1102   BUN 11 09/07/2024 1022   CREATININE 0.81 09/07/2024 1022   CREATININE 0.83 09/14/2023 1102   CREATININE 0.87 07/09/2012 1603   CALCIUM  9.7 09/07/2024 1022   PROT 6.7 09/07/2024 1022   ALBUMIN  4.4 09/07/2024 1022   AST 15 09/07/2024 1022   AST 13 (L) 09/14/2023 1102   ALT 10 09/07/2024 1022   ALT 11 09/14/2023 1102   ALKPHOS 76 09/07/2024 1022   BILITOT 1.0 09/07/2024 1022   BILITOT 1.3 (H) 09/14/2023 1102   GFRNONAA >60 09/14/2023 1102   GFRAA 81 10/17/2020 0924   GFRAA >60 07/14/2019 1126    No results found for: TOTALPROTELP, ALBUMINELP, A1GS, A2GS, BETS, BETA2SER, GAMS, MSPIKE, SPEI  No results found for: KPAFRELGTCHN,  LAMBDASER, [facility]  Lab Results  Component Value Date   WBC 5.4 09/14/2023   NEUTROABS 3.5 09/14/2023   HGB 13.1 09/14/2023   HCT 39.3 09/14/2023   MCV 85.8 09/14/2023   PLT 187 09/14/2023   No results found for: LABCA2  No components found for: OJARJW874  No results for input(s): INR in the last 168 hours.  No results found for: LABCA2  No results found for: RJW800  No results found for: CAN125  No results found for: CAN153  No results found for: CA2729  No components found for: HGQUANT  No results found for: CEA1, CEA / No results found for: CEA1, CEA   No results found for: AFPTUMOR  No results found for: CHROMOGRNA  No results  found for: HGBA, HGBA2QUANT, HGBFQUANT, HGBSQUAN (Hemoglobinopathy evaluation)   No results found for: LDH  Lab Results  Component Value Date   IRON  80 08/18/2023   TIBC 474 (H) 08/18/2023   IRONPCTSAT 17 08/18/2023   (Iron  and TIBC)  Lab Results  Component Value Date   FERRITIN 35 08/18/2023    Urinalysis    Component Value Date/Time   COLORURINE YELLOW 09/11/2023 1538   APPEARANCEUR CLOUDY (A) 09/11/2023 1538   APPEARANCEUR Cloudy (A) 08/18/2023 1130   LABSPEC 1.025 09/11/2023 1538   PHURINE 6.0 09/11/2023 1538   GLUCOSEU NEGATIVE 09/11/2023 1538   HGBUR TRACE (A) 09/11/2023 1538   BILIRUBINUR NEGATIVE 09/11/2023 1538   BILIRUBINUR Negative 08/18/2023 1130   KETONESUR NEGATIVE 09/11/2023 1538   PROTEINUR NEGATIVE 09/11/2023 1538   UROBILINOGEN 1.0 02/25/2023 0000   UROBILINOGEN Normal 12/05/2022 0000   NITRITE NEGATIVE 09/11/2023 1538   LEUKOCYTESUR MODERATE (A) 09/11/2023 1538    STUDIES: No results found.    ELIGIBLE FOR AVAILABLE RESEARCH PROTOCOL: no   ASSESSMENT: 63 y.o. [place], [place] woman status post right breast upper outer quadrant biopsy 06/04/2018 for a clinical T1c N0, stage IA invasive ductal carcinoma, grade 1, estrogen and progesterone receptor strongly positive, HER-2 not amplified, with an MIB-108%  (1) status post right lumpectomy and sentinel lymph node sampling 07/19/2018 for a pT1c pN1, stage IB invasive ductal carcinoma, grade 1, with negative margins  (2) Mammaprint obtained on the definitive surgical specimen showed low risk, predicting a risk of recurrence outside the breast within 5 years of 2 to 3% if the patient takes antiestrogens for 5 years.  No significant benefit from chemotherapy anticipated  (3) adjuvant radiation 09/01/2018 - 10/19/2018  Site/dose: The patient initially received a dose of 50.4 Gy in 28 fractions to the right breast using whole-breast tangent fields. This was delivered using a 3-D conformal  technique. The patient then received a boost to the seroma. This delivered an additional 10 Gy in 5 fractions using 15x photons with a Complex Isodose technique. The total dose was 60.4 Gy.  (4) started anastrozole  11/28/2018, discontinued after 4 weeks with diarrhea and other side effects  (5) exemestane  started January 2020.   PLAN:  Assessment and Plan Assessment & Plan  History of breast cancer S/p 5 yrs of anti estrogen therapy. No concern for recurrence She will continue FU with PCP and annual mammogram screening Next mammogram due in Dec.  Obesity, status post significant weight loss Significant weight loss achieved with residual loose skin. - Encouraged weightlifting to build muscle and tone body.   - Instruct to return to hematology and medical oncology as needed.  Time spent: 30 min  *Total Encounter Time as defined by the Centers for Medicare and  Medicaid Services includes, in addition to the face-to-face time of a patient visit (documented in the note above) non-face-to-face time: obtaining and reviewing outside history, ordering and reviewing medications, tests or procedures, care coordination (communications with other health care professionals or caregivers) and documentation in the medical record.

## 2024-09-15 ENCOUNTER — Telehealth: Payer: Self-pay | Admitting: *Deleted

## 2024-09-15 NOTE — Telephone Encounter (Signed)
 Call to Pharmacy as prescription has been sent in.  Patient's insurance does not cover the Wegovy  as she has a new insurance.  Has taken in another insurance Card and it does not cover the Wegovy  as patient is no longer covered under Medicaid.  Patient told pharmacy that she has another insurance but has not taken card in for them to run the claim.  Attempts to call patient .  Unable to reach message left that the Clinics had called.   Copied from CRM #[id number]. Topic: Clinical - Prescription Issue >> Sep 12, 2024 10:48 AM Farrel B wrote: Patient states that she has called the pharmacy in regards to her WEGOVY  2.4 MG/0.75ML SOAJ SQ injection, pharmacy states they were waiting on the provider signing off on the refill request please call and update patient >> Sep 15, 2024  9:40 AM Susanna ORN wrote: Patient called stating that she's completely out of Wegovy . States she's needing it filled today & pharmacy told her they are needing a PA for it. Advised patient that if the medication is needing a PA, it's not a one day process. Please give patient a call to advise her of what she needs to do since she's out of medication.  >> Sep 13, 2024  1:19 PM Laurier C wrote: Patient states she is waiting for the following medication WEGOVY  2.4 MG/0.75ML and was informed by the pharmacy it would need a prior auth.

## 2024-09-16 ENCOUNTER — Telehealth (INDEPENDENT_AMBULATORY_CARE_PROVIDER_SITE_OTHER): Payer: Self-pay | Admitting: Psychiatry

## 2024-09-16 ENCOUNTER — Telehealth: Payer: Self-pay

## 2024-09-16 ENCOUNTER — Encounter (HOSPITAL_COMMUNITY): Payer: Self-pay | Admitting: Psychiatry

## 2024-09-16 DIAGNOSIS — F411 Generalized anxiety disorder: Secondary | ICD-10-CM | POA: Diagnosis not present

## 2024-09-16 DIAGNOSIS — F4310 Post-traumatic stress disorder, unspecified: Secondary | ICD-10-CM

## 2024-09-16 DIAGNOSIS — F32A Depression, unspecified: Secondary | ICD-10-CM

## 2024-09-16 MED ORDER — BUSPIRONE HCL 15 MG PO TABS: 15.0000 mg | ORAL_TABLET | Freq: Two times a day (BID) | ORAL | 3 refills | Status: DC

## 2024-09-16 MED ORDER — TRAZODONE HCL 150 MG PO TABS: 150.0000 mg | ORAL_TABLET | Freq: Every day | ORAL | 3 refills | Status: DC

## 2024-09-16 MED ORDER — DULOXETINE HCL 30 MG PO CPEP: 30.0000 mg | ORAL_CAPSULE | Freq: Every day | ORAL | 3 refills | Status: DC

## 2024-09-16 MED ORDER — HYDROXYZINE HCL 50 MG PO TABS: 50.0000 mg | ORAL_TABLET | Freq: Every evening | ORAL | 3 refills | Status: DC | PRN

## 2024-09-16 NOTE — Telephone Encounter (Signed)
 Prior Authorization for patient (Wegovy  2.4MG /0.75ML auto-injectors) came through on cover my meds was submitted with last office notes and labs awaiting approval or denial.  XZB:[id number]

## 2024-09-16 NOTE — Progress Notes (Signed)
 BH MD/PA/NP OP Progress Note Virtual Visit via Video Note  I connected with [name] on 09/16/24 at 11:00 AM EDT by a video enabled telemedicine application and verified that I am speaking with the correct person using two identifiers.  Location: Patient: Home Provider: Clinic   I discussed the limitations of evaluation and management by telemedicine and the availability of in person appointments. The patient expressed understanding and agreed to proceed.  I provided 30 minutes of non-face-to-face time during this encounter.   09/16/2024 10:23 AM [name]  MRN:  [id number]  Chief Complaint: I feel good  HPI:  63 year old female seen today for follow-up psychiatric evaluation.  She has a psychiatric history of anxiety, depression, and PTSD.  Currently she is being managed on BuSpar  15 mg 3 times daily (noting that she takes it twice daily), Cymbalta  30 mg daily, hydroxyzine  50 mg nightly as needed, and trazodone  150 mg nightly as needed.  She notes the medications are effective in managing her psychiatric condition.   Today she was well-groomed, pleasant, cooperative, and engaged in conversation.  She informed Clinical research associate that she feels good.  She notes that she has been in less pain since getting a spinal epidural.  She also notes that her mood, anxiety, and depression are well-managed.  Today provider conducted a GAD-7 and patient scored a 2.  Provider also conducted PHQ-9 and patient scored a 1.  She endorsed adequate sleep and appetite.  Today she denies SI/HI/VAH, mania, paranoia.    Patient informed writer that recently her sister-in-law passed away.  She notes that this was difficult for her at her husband's but inform her that there Ingal to cope.  No medication changes made today.  Patient agreed to take medications as prescribed.  No other concerns noted at this time.  No other concerns noted at this time.     Visit Diagnosis:    ICD-10-CM   1. GAD  (generalized anxiety disorder)  F41.1 busPIRone  (BUSPAR ) 15 MG tablet    traZODone  (DESYREL ) 150 MG tablet    hydrOXYzine  (ATARAX ) 50 MG tablet    DULoxetine  (CYMBALTA ) 30 MG capsule    2. PTSD (post-traumatic stress disorder)  F43.10 busPIRone  (BUSPAR ) 15 MG tablet    traZODone  (DESYREL ) 150 MG tablet    DULoxetine  (CYMBALTA ) 30 MG capsule    3. Mild depression  F32.A busPIRone  (BUSPAR ) 15 MG tablet    traZODone  (DESYREL ) 150 MG tablet    hydrOXYzine  (ATARAX ) 50 MG tablet    DULoxetine  (CYMBALTA ) 30 MG capsule           Past Psychiatric History: anxiety, depression, and PTSD  Past Medical History:  Past Medical History:  Diagnosis Date   Anemia    Anxiety    Aortic valve stenosis, severe    Arthritis    PAIN AND SEVERE OA LEFT KNEE ; S/P RIGHT TKA ON 02/03/12; HAS LOWER BACK PAIN-UNABLE TO STAND MORE THAN 10 MIN; ARTHRITIS ALL OVER   Asthma    Blood transfusion    2013[facility]   Breast cancer in female [facility])    Right   CAD (coronary artery disease)    Cath 2010 with DES x 1 RCA-- PT'S CARDIOLOGIST IS DR. MCALHANY   Chronic diastolic congestive heart failure (HCC)    COPD (chronic obstructive pulmonary disease) (HCC)    Mild   COPD exacerbation (HCC) 11/16/2022   Depression    Diabetes mellitus DIAGNOSED IN2010   Dyspnea    with much  ambulation   Eczema    on back   GERD (gastroesophageal reflux disease)    Headache    migraines younger- rare now 02/07/21   Heart murmur    no current problems   History of hiatal hernia    History of kidney stones    passed or blasted   History of UTI 12/05/2022   Hyperlipidemia    Hypertension    Morbid obesity with body mass index of 60.0-69.9 in adult [facility])    Myocardial infarction (HCC)    PT THINKS SHE WAS DX WITH MI AT THE TIME OF HEART STENTING   Neuromuscular disorder (HCC)    bilateral arm/hands   Normocytic anemia 02/08/2012   Oxygen dependent    uses 3L oxygen night/prn   Personal history of radiation therapy     Pneumonia    Pulmonary embolism (HCC) 02/08/2012   S/P RT TOTAL KNEE ON 02/03/12--ON 02/08/12--DEVELOPED ACUTE SOB AND CHEST PAIN--AND DIAGNOSED WITH  PULMONARY EMBOLUS AND PNEUMONIA   Restless leg syndrome    Sleep apnea    Type 2 diabetes mellitus without complication, without long-term current use of insulin  (HCC) 02/08/2012   Uterine fibroid    NO PROBLEMS AT PRESENT FROM THE FIBROIDS-STATES SHE IS POST MENOPAUSAL-LAST MENSES 2010 EXCEPT FOR EPISODE THIS YR OF BLEEDING RELATED TO FIBROIDS.   Weakness    BOTH HANDS - S/P BILATERAL CARPAL TUNNEL RELEASE--BUT STILL HAS WEAKNESS--OFTEN DROPS THINGS    Past Surgical History:  Procedure Laterality Date   BACK SURGERY  02/11/2021   Dr Lucilla   BICEPT TENODESIS Right 09/15/2023   Procedure: BICEPS TENODESIS;  Surgeon: Addie Cordella Hamilton, MD;  Location: [facility] OR;  Service: Orthopedics;  Laterality: Right;   BREAST BIOPSY Right 06/04/2018   BREAST LUMPECTOMY Right 06/2018   BREAST LUMPECTOMY WITH RADIOACTIVE SEED AND SENTINEL LYMPH NODE BIOPSY Right 07/19/2018   Procedure: BREAST LUMPECTOMY WITH RADIOACTIVE SEED AND SENTINEL LYMPH NODE BIOPSY;  Surgeon: Ethyl Lenis, MD;  Location: [facility] OR;  Service: General;  Laterality: Right;   CARPAL TUNNEL RELEASE     Bilateral   CHOLECYSTECTOMY     COLONOSCOPY  2010   CORONARY ANGIOPLASTY     2010 has stent in place   CYSTOSCOPY W/ RETROGRADES Right 09/21/2013   Procedure: CYSTOSCOPY WITH RIGHT RETROGRADE PYELOGRAM RIGHT DOUBLE J STENT ;  Surgeon: Donnice Gwenyth Brooks, MD;  Location: [facility] ORS;  Service: Urology;  Laterality: Right;   CYSTOSCOPY W/ URETERAL STENT PLACEMENT Right 11/16/2022   Procedure: CYSTOSCOPY WITH RETROGRADE PYELOGRAM/URETERAL STENT PLACEMENT;  Surgeon: Lovie Arlyss CROME, MD;  Location: [facility] OR;  Service: Urology;  Laterality: Right;   CYSTOSCOPY WITH RETROGRADE PYELOGRAM, URETEROSCOPY AND STENT PLACEMENT Right 01/13/2023   Procedure: CYSTOSCOPY WITH RETROGRADE PYELOGRAM, URETEROSCOPY AND  STENT EXCHANGE;  Surgeon: Roseann Adine PARAS., MD;  Location: [facility] ORS;  Service: Urology;  Laterality: Right;   CYSTOSCOPY WITH URETEROSCOPY AND STENT PLACEMENT Right 10/25/2013   Procedure: CYSTOSCOPY RIGHT URETEROSCOPY HOLMIUM LASER LITHO AND STENT PLACEMENT;  Surgeon: Donnice Gwenyth Brooks, MD;  Location: [facility];  Service: Urology;  Laterality: Right;   HERNIA REPAIR  1995   umbilical ,   HOLMIUM LASER APPLICATION Right 01/13/2023   Procedure: HOLMIUM LASER APPLICATION;  Surgeon: Roseann Adine PARAS., MD;  Location: [facility]S;  Service: Urology;  Laterality: Right;   INTRAOPERATIVE TRANSESOPHAGEAL ECHOCARDIOGRAM N/A 12/12/2014   Procedure: INTRAOPERATIVE TRANSESOPHAGEAL ECHOCARDIOGRAM;  Surgeon: Lonni JONETTA Cash, MD;  Location[facility] OR;  Service: Cardiovascular;  Laterality: N/A;   KNEE  ARTHROPLASTY  02/03/2012   Procedure: COMPUTER ASSISTED TOTAL KNEE ARTHROPLASTY;  Surgeon: Lonni CINDERELLA Poli, MD;  Location: [facility] OR;  Service: Orthopedics;  Laterality: Right;  Right total knee arthroplasty   LEFT AND RIGHT HEART CATHETERIZATION WITH CORONARY ANGIOGRAM N/A 03/17/2013   Procedure: LEFT AND RIGHT HEART CATHETERIZATION WITH CORONARY ANGIOGRAM;  Surgeon: Lonni JONETTA Cash, MD;  Location: [facility] CATH LAB;  Service: Cardiovascular;  Laterality: N/A;   LEFT AND RIGHT HEART CATHETERIZATION WITH CORONARY/GRAFT ANGIOGRAM N/A 09/14/2014   Procedure: LEFT AND RIGHT HEART CATHETERIZATION WITH EL BILE;  Surgeon: Lonni JONETTA Cash, MD;  Location: [facility] CATH LAB;  Service: Cardiovascular;  Laterality: N/A;   REVERSE SHOULDER ARTHROPLASTY Left 03/06/2022   Procedure: LEFT SHOULDER REPLACEMENT APPLICATION OF WOUND VAC;  Surgeon: Addie Cordella Hamilton, MD;  Location: [facility] OR;  Service: Orthopedics;  Laterality: Left;   REVERSE SHOULDER ARTHROPLASTY Right 09/15/2023   Procedure: REVERSE SHOULDER ARTHROPLASTY;  Surgeon: Addie Cordella Hamilton, MD;  Location: [facility] OR;  Service: Orthopedics;   Laterality: Right;   TEE WITHOUT CARDIOVERSION N/A 03/14/2013   Procedure: TRANSESOPHAGEAL ECHOCARDIOGRAM (TEE);  Surgeon: Redell GORMAN Shallow, MD;  Location: [facility] ENDOSCOPY;  Service: Cardiovascular;  Laterality: N/A;   TEE WITHOUT CARDIOVERSION N/A 11/14/2014   Procedure: TRANSESOPHAGEAL ECHOCARDIOGRAM (TEE);  Surgeon: Redell GORMAN Shallow, MD;  Location: [facility] ENDOSCOPY;  Service: Cardiovascular;  Laterality: N/A;   TONSILLECTOMY     maybe as a child- does not know   TOTAL KNEE ARTHROPLASTY  09/10/2012   Procedure: TOTAL KNEE ARTHROPLASTY;  Surgeon: Lonni CINDERELLA Poli, MD;  Location: [facility];  Service: Orthopedics;  Laterality: Left;   TOTAL KNEE REVISION Right 07/15/2013   Procedure: REVISION ARTHROPLASTY RIGHT KNEE;  Surgeon: Lonni CINDERELLA Poli, MD;  Location: [facility];  Service: Orthopedics;  Laterality: Right;   TRANSCATHETER AORTIC VALVE REPLACEMENT, TRANSFEMORAL N/A 12/12/2014   Procedure: TRANSCATHETER AORTIC VALVE REPLACEMENT, TRANSFEMORAL;  Surgeon: Lonni JONETTA Cash, MD;  Location: [facility] OR;  Service: Cardiovascular;  Laterality: N/A;   TRIGGER FINGER RELEASE  09/10/2012   Procedure: RELEASE TRIGGER FINGER/A-1 PULLEY;  Surgeon: Lonni CINDERELLA Poli, MD;  Location: [facility] ORS;  Service: Orthopedics;  Laterality: Right;  Right Ring Finger   TUBAL LIGATION      Family Psychiatric History: Mother alcohol use and brother intellectual delay   Family History:  Family History  Problem Relation Age of Onset   Breast cancer Mother 62 - 69       stage IV at diagnosis   Emphysema Mother        smoked   Heart disease Mother    COPD Father        smoked   Asthma Father    Heart disease Father    Cancer Brother        Sinus   Colon cancer Neg Hx    Esophageal cancer Neg Hx    Inflammatory bowel disease Neg Hx    Liver disease Neg Hx    Pancreatic cancer Neg Hx    Rectal cancer Neg Hx    Stomach cancer Neg Hx     Social History:  Social History   Socioeconomic History   Marital  status: Married    Spouse name: Not on file   Number of children: 2   Years of education: Not on file   Highest education level: Not on file  Occupational History   Occupation: Disabled  Tobacco Use   Smoking status: Former    Current packs/day: 0.00    Average packs/day:  1.5 packs/day for 30.0 years (45.0 ttl pk-yrs)    Types: Cigarettes    Start date: 12/29/1970    Quit date: 12/29/2000    Years since quitting: 23.7   Smokeless tobacco: Never  Vaping Use   Vaping status: Never Used  Substance and Sexual Activity   Alcohol use: Not Currently   Drug use: Not Currently    Types: Marijuana    Comment: CBD oils through vape shops   Sexual activity: Not Currently    Birth control/protection: Surgical, Post-menopausal    Comment: tubal ligation  Other Topics Concern   Not on file  Social History Narrative   Not on file   Social Drivers of Health   Financial Resource Strain: Low Risk  (06/28/2024)   Overall Financial Resource Strain (CARDIA)    Difficulty of Paying Living Expenses: Not hard at all  Food Insecurity: No Food Insecurity (06/29/2024)   Hunger Vital Sign    Worried About Running Out of Food in the Last Year: Never true    Ran Out of Food in the Last Year: Never true  Transportation Needs: No Transportation Needs (06/29/2024)   PRAPARE - Administrator, Civil Service (Medical): No    Lack of Transportation (Non-Medical): No  Physical Activity: Inactive (01/18/2024)   Exercise Vital Sign    Days of Exercise per Week: 0 days    Minutes of Exercise per Session: 0 min  Stress: No Stress Concern Present (03/17/2024)   [employer] of Occupational Health - Occupational Stress Questionnaire    Feeling of Stress : Only a little  Social Connections: Moderately Integrated (06/28/2024)   Social Connection and Isolation Panel    Frequency of Communication with Friends and Family: More than three times a week    Frequency of Social Gatherings with Friends and Family:  Twice a week    Attends Religious Services: More than 4 times per year    Active Member of Clubs or Organizations: Not on file    Attends Banker Meetings: Never    Marital Status: Married    Allergies: No Known Allergies  Metabolic Disorder Labs: Lab Results  Component Value Date   HGBA1C 6.1 (A) 06/28/2024   MPG 157 09/09/2023   MPG 143 01/08/2023   No results found for: PROLACTIN Lab Results  Component Value Date   CHOL 170 09/07/2024   TRIG 126 09/07/2024   HDL 61 09/07/2024   CHOLHDL 2.8 09/07/2024   VLDL 20 12/28/2015   LDLCALC 87 09/07/2024   LDLCALC 101 (H) 12/25/2022   Lab Results  Component Value Date   TSH 2.820 09/25/2020   TSH 2.918 09/21/2013    Therapeutic Level Labs: No results found for: LITHIUM No results found for: VALPROATE No results found for: CBMZ  Current Medications: Current Outpatient Medications  Medication Sig Dispense Refill   acetaminophen  (TYLENOL ) 500 MG tablet Take 1,000 mg by mouth every 6 (six) hours as needed for moderate pain.     amLODipine  (NORVASC ) 5 MG tablet Take 1 tablet (5 mg total) by mouth daily. 90 tablet 2   aspirin  EC 81 MG tablet Take 1 tablet (81 mg total) by mouth daily. Swallow whole. 30 tablet 12   atorvastatin  (LIPITOR ) 80 MG tablet Take 1 tablet (80 mg total) by mouth daily. 90 tablet 3   Blood Pressure Monitoring (BLOOD PRESSURE CUFF) MISC Please follow insert instructions. 1 each 0   busPIRone  (BUSPAR ) 15 MG tablet Take 1 tablet (15 mg total)  by mouth 2 (two) times daily. 60 tablet 3   clopidogrel  (PLAVIX ) 75 MG tablet Take 1 tablet (75 mg total) by mouth every morning. 90 tablet 3   COMIRNATY syringe Inject 0.3 mLs into the muscle once.     DULoxetine  (CYMBALTA ) 30 MG capsule Take 1 capsule (30 mg total) by mouth daily. 30 capsule 3   Evolocumab  (REPATHA  SURECLICK) 140 MG/ML SOAJ Inject 140 mg into the skin every 14 (fourteen) days. 6 mL 3   ezetimibe  (ZETIA ) 10 MG tablet Take 1 tablet  by mouth once daily 90 tablet 3   Fluticasone -Umeclidin-Vilant (TRELEGY ELLIPTA ) 100-62.5-25 MCG/ACT AEPB Inhale 1 puff into the lungs daily. 28 each 3   gabapentin  (NEURONTIN ) 300 MG capsule TAKE 1 CAPSULE BY MOUTH IN THE MORNING, 1 CAPSULE IN THE AFTERNOON AND 3 CAPSULES AT BEDTIME 150 capsule 0   hydrOXYzine  (ATARAX ) 50 MG tablet Take 1 tablet (50 mg total) by mouth at bedtime as needed. 30 tablet 3   ketorolac  (ACULAR ) 0.5 % ophthalmic solution Place 1 drop into the right eye 4 (four) times daily.     losartan -hydrochlorothiazide  (HYZAAR) 50-12.5 MG tablet Take 1 tablet by mouth daily. 90 tablet 3   metFORMIN  (GLUCOPHAGE -XR) 500 MG 24 hr tablet Take 1 tablet by mouth twice daily 180 tablet 0   metoprolol  succinate (TOPROL -XL) 50 MG 24 hr tablet Take 1 tablet (50 mg total) by mouth daily. Take with or immediately following a meal.TAKE 1 TABLET BY MOUTH ONCE DAILY WITH MEALS OR  IMMEDIATELY  FOLLOWING  A  MEAL 90 tablet 2   montelukast  (SINGULAIR ) 10 MG tablet TAKE 1 TABLET BY MOUTH AT BEDTIME 90 tablet 0   ofloxacin (OCUFLOX) 0.3 % ophthalmic solution Place 1 drop into the right eye 4 (four) times daily.     prednisoLONE acetate (PRED FORTE) 1 % ophthalmic suspension Place 1 drop into the right eye 4 (four) times daily.     traZODone  (DESYREL ) 150 MG tablet Take 1 tablet (150 mg total) by mouth at bedtime. 30 tablet 3   TWINRIX 720-20 ELU-MCG/ML injection Inject 1 mL into the muscle once.     VENTOLIN  HFA 108 (90 Base) MCG/ACT inhaler Inhale 2 puffs into the lungs every 6 (six) hours as needed for wheezing or shortness of breath. 18 g 3   WEGOVY  2.4 MG/0.75ML SOAJ SQ injection INJECT 2.4 MG INTO THE SKIN ONCE WEEKLY 4 mL 0   No current facility-administered medications for this visit.     Musculoskeletal: Strength & Muscle Tone: within normal limits and Telehealth visit Gait & Station: normal, Telehealth Patient leans: N/A  Psychiatric Specialty Exam: Review of Systems  Last menstrual  period 02/03/2012.There is no height or weight on file to calculate BMI.  General Appearance: Well Groomed  Eye Contact:  Good  Speech:  Clear and Coherent and Normal Rate  Volume:  Normal  Mood:  Euthymic  Affect:  Appropriate and Congruent  Thought Process:  Coherent, Goal Directed, and Linear  Orientation:  Full (Time, Place, and Person)  Thought Content: WDL and Logical   Suicidal Thoughts:  No  Homicidal Thoughts:  No  Memory:  Immediate;   Good Recent;   Good Remote;   Good  Judgement:  Good  Insight:  Good  Psychomotor Activity:  Normal  Concentration:  Concentration: Good and Attention Span: Good  Recall:  Good  Fund of Knowledge: Good  Language: Good  Akathisia:  No  Handed:  Right  AIMS (if indicated): not done  Assets:  Communication Skills Desire for Improvement Financial Resources/Insurance Housing Leisure Time Physical Health Social Support  ADL's:  Intact  Cognition: WNL  Sleep:  Good   Screenings: GAD-7    Flowsheet Row Video Visit from 09/16/2024 in [facility] Video Visit from 07/29/2024 in [facility] Office Visit from 06/28/2024 in [facility] [place] Video Visit from 05/11/2024 in [facility] Video Visit from 02/17/2024 in [facility]  Total GAD-7 Score 2 2 0 2 6   PHQ2-9    Flowsheet Row Video Visit from 09/16/2024 in [facility] Office Visit from 09/07/2024 in [facility] - [facility] Dept Of [place]. [facility] Video Visit from 07/29/2024 in [facility] Patient Outreach Telephone from 06/29/2024 in [place] [facility] Office Visit from 06/28/2024 in [facility] - A Dept Of [place]. [facility]  PHQ-2 Total Score 0 0 2 0 0  PHQ-9 Total Score 1 -- 3 -- 4   Flowsheet Row  UC from 07/29/2024 in [facility]) Most recent reading at 07/29/2024  5:11 PM Video Visit from 07/29/2024 in [facility] Most recent reading at 07/29/2024  1:48 PM Ad[facility] (Discharged) [facility] Most recent reading at 09/15/2023  6:06 AM  C-SSRS RISK CATEGORY No Risk No Risk No Risk     Assessment and Plan: Patient reports that her anxiety, depression, and sleep are well-managed.No medication changes made today.  Patient agreed to take medications as prescribed. 1. GAD (generalized anxiety disorder)  Continue- busPIRone  (BUSPAR ) 15 MG tablet; Take 1 tablet (15 mg total) by mouth 2 (two) times daily.  Dispense: 60 tablet; Refill: 3 Continue- traZODone  (DESYREL ) 150 MG tablet; Take 1 tablet (150 mg total) by mouth at bedtime.  Dispense: 30 tablet; Refill: 3 Continue- hydrOXYzine  (ATARAX ) 50 MG tablet; Take 1 tablet (50 mg total) by mouth at bedtime as needed.  Dispense: 30 tablet; Refill: 3 Continue- DULoxetine  (CYMBALTA ) 30 MG capsule; Take 1 capsule (30 mg total) by mouth daily.  Dispense: 30 capsule; Refill: 3  2. PTSD (post-traumatic stress disorder)  Continue- busPIRone  (BUSPAR ) 15 MG tablet; Take 1 tablet (15 mg total) by mouth 2 (two) times daily.  Dispense: 60 tablet; Refill: 3 Continue- traZODone  (DESYREL ) 150 MG tablet; Take 1 tablet (150 mg total) by mouth at bedtime.  Dispense: 30 tablet; Refill: 3 Continue- DULoxetine  (CYMBALTA ) 30 MG capsule; Take 1 capsule (30 mg total) by mouth daily.  Dispense: 30 capsule; Refill: 3  3. Mild depression  Continue- busPIRone  (BUSPAR ) 15 MG tablet; Take 1 tablet (15 mg total) by mouth 2 (two) times daily.  Dispense: 60 tablet; Refill: 3 Continue- traZODone  (DESYREL ) 150 MG tablet; Take 1 tablet (150 mg total) by mouth at bedtime.  Dispense: 30 tablet; Refill: 3 Continue- hydrOXYzine  (ATARAX ) 50 MG tablet; Take 1 tablet (50 mg  total) by mouth at bedtime as needed.  Dispense: 30 tablet; Refill: 3 Continue- DULoxetine  (CYMBALTA ) 30 MG capsule; Take 1 capsule (30 mg total) by mouth daily.  Dispense: 30 capsule; Refill: 3   Consent: Patient/Guardian gives verbal consent for treatment and assignment of benefits for services provided during this visit. Patient/Guardian expressed understanding and agreed to proceed.   Follow-up in  3 months Follow-up therapy Zane FORBES Bach, NP 09/16/2024, 10:23 AM

## 2024-09-19 NOTE — Telephone Encounter (Signed)
 [name] (Key: [id number]) Rx #: [id number] Wegovy  2.4MG /0.75ML auto-injectors Form [employer] Medicare Part D Electronic Prior Authorization Form (2017 NCPDP) Created 3 days ago Sent to Plan 3 days ago Plan Response 3 days ago Submit Clinical Questions 3 days ago Determination Favorable 3 days ago Your prior authorization for Wegovy  has been approved! More Info Personalized support and financial assistance may be available through the [employer] program. For more information, and to see program requirements, click on the More Info button to the right.  Message from plan: Request Reference Number: [id number]. WEGOVY  INJ 2.4MG  is approved through 12/28/2024. Your patient may now fill this prescription and it will be covered.. Authorization Expiration Date: December 28, 2024.

## 2024-10-09 ENCOUNTER — Other Ambulatory Visit: Payer: Self-pay | Admitting: Student

## 2024-10-10 ENCOUNTER — Other Ambulatory Visit: Payer: Self-pay

## 2024-10-10 DIAGNOSIS — E785 Hyperlipidemia, unspecified: Secondary | ICD-10-CM

## 2024-10-10 DIAGNOSIS — I2510 Atherosclerotic heart disease of native coronary artery without angina pectoris: Secondary | ICD-10-CM

## 2024-10-11 ENCOUNTER — Telehealth: Payer: Self-pay

## 2024-10-11 MED ORDER — REPATHA SURECLICK 140 MG/ML ~~LOC~~ SOAJ: 140.0000 mg | SUBCUTANEOUS | 3 refills | Status: AC

## 2024-10-11 NOTE — Telephone Encounter (Signed)
 Prior Authorization for patient (Repatha  SureClick 140MG /ML auto-injectors) came through on cover my meds was submitted with last office notes and labs awaiting approval or denial.  XZB:[id number]

## 2024-10-11 NOTE — Telephone Encounter (Signed)
 [name] (Key: [id number]) PA Case ID #: [id number] Rx #: [id number] Need Help? Call us  at [phone] Outcome Approved today by [employer] Medicare 2017 NCPDP Request Reference Number: [id number]. REPATHA  SURE INJ 140MG /ML is approved through 04/11/2025. Your patient may now fill this prescription and it will be covered. Effective Date: 10/11/2024 Authorization Expiration Date: 04/11/2025 Drug Repatha  SureClick 140MG /ML auto-injectors ePA cloud logo Form [employer] Medicare Part D Electronic Prior Authorization Form [phone] NCPDP) Original Claim Info [phone]

## 2024-10-19 ENCOUNTER — Ambulatory Visit: Admitting: Student

## 2024-10-19 NOTE — Progress Notes (Deleted)
 CC: ***  HPI: [name] is a 63 y.o. female living with a history stated below and presents today for ***. Please see problem based assessment and plan for additional details.  Past Medical History:  Diagnosis Date   Anemia    Anxiety    Aortic valve stenosis, severe    Arthritis    PAIN AND SEVERE OA LEFT KNEE ; S/P RIGHT TKA ON 02/03/12; HAS LOWER BACK PAIN-UNABLE TO STAND MORE THAN 10 MIN; ARTHRITIS ALL OVER   Asthma    Blood transfusion    2013[facility]   Breast cancer in female [facility])    Right   CAD (coronary artery disease)    Cath 2010 with DES x 1 RCA-- PT'S CARDIOLOGIST IS DR. MCALHANY   Chronic diastolic congestive heart failure (HCC)    COPD (chronic obstructive pulmonary disease) (HCC)    Mild   COPD exacerbation (HCC) 11/16/2022   Depression    Diabetes mellitus DIAGNOSED IN2010   Dyspnea    with much ambulation   Eczema    on back   GERD (gastroesophageal reflux disease)    Headache    migraines younger- rare now 02/07/21   Heart murmur    no current problems   History of hiatal hernia    History of kidney stones    passed or blasted   History of UTI 12/05/2022   Hyperlipidemia    Hypertension    Morbid obesity with body mass index of 60.0-69.9 in adult [facility])    Myocardial infarction (HCC)    PT THINKS SHE WAS DX WITH MI AT THE TIME OF HEART STENTING   Neuromuscular disorder (HCC)    bilateral arm/hands   Normocytic anemia 02/08/2012   Oxygen dependent    uses 3L oxygen night/prn   Personal history of radiation therapy    Pneumonia    Pulmonary embolism (HCC) 02/08/2012   S/P RT TOTAL KNEE ON 02/03/12--ON 02/08/12--DEVELOPED ACUTE SOB AND CHEST PAIN--AND DIAGNOSED WITH  PULMONARY EMBOLUS AND PNEUMONIA   Restless leg syndrome    Sleep apnea    Type 2 diabetes mellitus without complication, without long-term current use of insulin  (HCC) 02/08/2012   Uterine fibroid    NO PROBLEMS AT PRESENT FROM THE FIBROIDS-STATES SHE IS POST  MENOPAUSAL-LAST MENSES 2010 EXCEPT FOR EPISODE THIS YR OF BLEEDING RELATED TO FIBROIDS.   Weakness    BOTH HANDS - S/P BILATERAL CARPAL TUNNEL RELEASE--BUT STILL HAS WEAKNESS--OFTEN DROPS THINGS    Current Outpatient Medications on File Prior to Visit  Medication Sig Dispense Refill   acetaminophen  (TYLENOL ) 500 MG tablet Take 1,000 mg by mouth every 6 (six) hours as needed for moderate pain.     amLODipine  (NORVASC ) 5 MG tablet Take 1 tablet (5 mg total) by mouth daily. 90 tablet 2   aspirin  EC 81 MG tablet Take 1 tablet (81 mg total) by mouth daily. Swallow whole. 30 tablet 12   atorvastatin  (LIPITOR ) 80 MG tablet Take 1 tablet (80 mg total) by mouth daily. 90 tablet 3   Blood Pressure Monitoring (BLOOD PRESSURE CUFF) MISC Please follow insert instructions. 1 each 0   busPIRone  (BUSPAR ) 15 MG tablet Take 1 tablet (15 mg total) by mouth 2 (two) times daily. 60 tablet 3   clopidogrel  (PLAVIX ) 75 MG tablet Take 1 tablet (75 mg total) by mouth every morning. 90 tablet 3   COMIRNATY syringe Inject 0.3 mLs into the muscle once.     DULoxetine  (CYMBALTA ) 30 MG capsule  Take 1 capsule (30 mg total) by mouth daily. 30 capsule 3   Evolocumab  (REPATHA  SURECLICK) 140 MG/ML SOAJ Inject 140 mg into the skin every 14 (fourteen) days. 6 mL 3   ezetimibe  (ZETIA ) 10 MG tablet Take 1 tablet by mouth once daily 90 tablet 3   Fluticasone -Umeclidin-Vilant (TRELEGY ELLIPTA ) 100-62.5-25 MCG/ACT AEPB Inhale 1 puff into the lungs daily. 28 each 3   gabapentin  (NEURONTIN ) 300 MG capsule TAKE 1 CAPSULE BY MOUTH IN THE MORNING, 1 CAPSULE IN THE AFTERNOON AND 3 CAPSULES AT BEDTIME 150 capsule 0   hydrOXYzine  (ATARAX ) 50 MG tablet Take 1 tablet (50 mg total) by mouth at bedtime as needed. 30 tablet 3   ketorolac  (ACULAR ) 0.5 % ophthalmic solution Place 1 drop into the right eye 4 (four) times daily.     losartan -hydrochlorothiazide  (HYZAAR) 50-12.5 MG tablet Take 1 tablet by mouth daily. 90 tablet 3   metFORMIN   (GLUCOPHAGE -XR) 500 MG 24 hr tablet Take 1 tablet by mouth twice daily 180 tablet 0   metoprolol  succinate (TOPROL -XL) 50 MG 24 hr tablet Take 1 tablet (50 mg total) by mouth daily. Take with or immediately following a meal.TAKE 1 TABLET BY MOUTH ONCE DAILY WITH MEALS OR  IMMEDIATELY  FOLLOWING  A  MEAL 90 tablet 2   montelukast  (SINGULAIR ) 10 MG tablet TAKE 1 TABLET BY MOUTH AT BEDTIME 90 tablet 0   ofloxacin (OCUFLOX) 0.3 % ophthalmic solution Place 1 drop into the right eye 4 (four) times daily.     prednisoLONE acetate (PRED FORTE) 1 % ophthalmic suspension Place 1 drop into the right eye 4 (four) times daily.     traZODone  (DESYREL ) 150 MG tablet Take 1 tablet (150 mg total) by mouth at bedtime. 30 tablet 3   TWINRIX 720-20 ELU-MCG/ML injection Inject 1 mL into the muscle once.     VENTOLIN  HFA 108 (90 Base) MCG/ACT inhaler Inhale 2 puffs into the lungs every 6 (six) hours as needed for wheezing or shortness of breath. 18 g 3   WEGOVY  2.4 MG/0.75ML SOAJ SQ injection INJECT 2.4 MG INTO THE SKIN ONCE A WEEK 4 mL 0   No current facility-administered medications on file prior to visit.    Family History  Problem Relation Age of Onset   Breast cancer Mother 53 - 21       stage IV at diagnosis   Emphysema Mother        smoked   Heart disease Mother    COPD Father        smoked   Asthma Father    Heart disease Father    Cancer Brother        Sinus   Colon cancer Neg Hx    Esophageal cancer Neg Hx    Inflammatory bowel disease Neg Hx    Liver disease Neg Hx    Pancreatic cancer Neg Hx    Rectal cancer Neg Hx    Stomach cancer Neg Hx     Social History   Socioeconomic History   Marital status: Married    Spouse name: Not on file   Number of children: 2   Years of education: Not on file   Highest education level: Not on file  Occupational History   Occupation: Disabled  Tobacco Use   Smoking status: Former    Current packs/day: 0.00    Average packs/day: 1.5 packs/day for  30.0 years (45.0 ttl pk-yrs)    Types: Cigarettes    Start date: 12/29/1970  Quit date: 12/29/2000    Years since quitting: 23.8   Smokeless tobacco: Never  Vaping Use   Vaping status: Never Used  Substance and Sexual Activity   Alcohol use: Not Currently   Drug use: Not Currently    Types: Marijuana    Comment: CBD oils through vape shops   Sexual activity: Not Currently    Birth control/protection: Surgical, Post-menopausal    Comment: tubal ligation  Other Topics Concern   Not on file  Social History Narrative   Not on file   Social Drivers of Health   Financial Resource Strain: Low Risk  (06/28/2024)   Overall Financial Resource Strain (CARDIA)    Difficulty of Paying Living Expenses: Not hard at all  Food Insecurity: No Food Insecurity (06/29/2024)   Hunger Vital Sign    Worried About Running Out of Food in the Last Year: Never true    Ran Out of Food in the Last Year: Never true  Transportation Needs: No Transportation Needs (06/29/2024)   PRAPARE - Administrator, Civil Service (Medical): No    Lack of Transportation (Non-Medical): No  Physical Activity: Inactive (01/18/2024)   Exercise Vital Sign    Days of Exercise per Week: 0 days    Minutes of Exercise per Session: 0 min  Stress: No Stress Concern Present (03/17/2024)   [employer] of Occupational Health - Occupational Stress Questionnaire    Feeling of Stress : Only a little  Social Connections: Moderately Integrated (06/28/2024)   Social Connection and Isolation Panel    Frequency of Communication with Friends and Family: More than three times a week    Frequency of Social Gatherings with Friends and Family: Twice a week    Attends Religious Services: More than 4 times per year    Active Member of [employer] or Organizations: Not on file    Attends Banker Meetings: Never    Marital Status: Married  Catering manager Violence: Not At Risk (06/29/2024)   Humiliation, Afraid, Rape, and Kick  questionnaire    Fear of Current or Ex-Partner: No    Emotionally Abused: No    Physically Abused: No    Sexually Abused: No    Review of Systems: ROS negative except for what is noted on the assessment and plan.  There were no vitals filed for this visit.  Physical Exam  Physical Exam: Constitutional: well-appearing *** sitting in ***, in no acute distress HENT: normocephalic atraumatic, mucous membranes moist Eyes: conjunctiva non-erythematous Cardiovascular: regular rate and rhythm, no m/r/g Pulmonary/Chest: normal work of breathing on room air, lungs clear to auscultation bilaterally Abdominal: soft, non-tender, non-distended MSK: *** Neurological: alert & oriented x 3, 5/5 strength in bilateral upper and lower extremities, normal gait Skin: warm and dry Psych: ***  Assessment & Plan:   Assessment & Plan     No orders of the defined types were placed in this encounter.  PMH: Mitral stenosis, HTN, CAD s/p DES, aortic stenosis status post TAVR, HFpEF, OSA, COPD and asthma, T2DM with neuropathy  Postural dizziness - Negative orthostatics and neuroexam - Query relative hypotension venous stasis - Stopped gabapentin , stopped amlodipine  stopped metformin   Polypharmacy - At LOV 9/10 stopped gabapentin , amlodipine  and metformin   HTN Status: {statusupdate:33856}. BP today ***. Reports taking metoprolol  50 mg daily, losartan -HCTZ 50-12.5 mg.  Last BMP in 08/2024 with normal renal function and electrolytes. *** symptoms. *** home BP monitoring.   Plan -Continue: *** -BMP ***  T2DM Status: {statusupdate:33856}.Last A1c  6.1 in 06/2024.  A1c today is NOT DUE.  Currently taking metformin  XR 500 mg daily, Wegovy  2.4 mg weekly.  Foot exam ***.   Plan -Continue *** -A1c in 12/2024 -Ophthalmology exam: UTD 12/2023 -Urine ACR: UTD 08/2024 -LDL 87 on 08/2024, taking Lipitor  80 mg and Zetia  10 mg, Repatha  ***bempedoic acid??***    No follow-ups on file.   Patient  {GC/GE:[id number]::discussed with,seen with} Dr. {[id number]::Tpoopjfd,Z. Hoffman,Winfrey,Narendra,Chun,Chambliss,Lau,Machen}  [name], D.O. [facility] Internal Medicine, PGY-3 Clinic Phone: [phone] Date 10/19/2024 Time 7:41 AM

## 2024-10-24 ENCOUNTER — Ambulatory Visit (INDEPENDENT_AMBULATORY_CARE_PROVIDER_SITE_OTHER): Admitting: Student

## 2024-10-24 VITALS — BP 130/81 | HR 87 | Temp 98.4°F | Ht 60.0 in | Wt 262.8 lb

## 2024-10-24 DIAGNOSIS — Z9181 History of falling: Secondary | ICD-10-CM

## 2024-10-24 DIAGNOSIS — E119 Type 2 diabetes mellitus without complications: Secondary | ICD-10-CM | POA: Diagnosis not present

## 2024-10-24 DIAGNOSIS — Z7985 Long-term (current) use of injectable non-insulin antidiabetic drugs: Secondary | ICD-10-CM | POA: Diagnosis not present

## 2024-10-24 DIAGNOSIS — M79672 Pain in left foot: Secondary | ICD-10-CM

## 2024-10-24 LAB — POCT GLYCOSYLATED HEMOGLOBIN (HGB A1C): HbA1c, POC (controlled diabetic range): 6 % (ref 0.0–7.0)

## 2024-10-24 LAB — GLUCOSE, CAPILLARY: Glucose-Capillary: 149 mg/dL — ABNORMAL HIGH (ref 70–99)

## 2024-10-24 MED ORDER — DICLOFENAC SODIUM 1 % EX GEL: 4.0000 g | Freq: Four times a day (QID) | CUTANEOUS | 0 refills | Status: DC

## 2024-10-24 NOTE — Progress Notes (Signed)
 CC: Foot pain/T2DM Follow up  HPI:  [name] is a 63 y.o. female living with a history stated below and presents today for foot pain/type 2 diabetes follow-up. Please see problem based assessment and plan for additional details.  Discussed the use of AI scribe software for clinical note transcription with the patient, who gave verbal consent to proceed.  History of Present Illness [name] is a 63 year old female with diabetes who presents with foot pain and swelling after a fall.  She has been experiencing significant pain and swelling in her foot following a fall approximately one month ago. The incident involved slipping off step stones while walking to her house, resulting in a twisted foot. Since the fall, there has been persistent swelling and sharp pain, particularly on the left side and top of her foot. Initial bruising was present, and some discoloration remains. The pain intensifies with walking and prolonged standing, impacting her ability to perform daily activities such as cleaning and cooking. She has been using ice packs to manage the swelling and pain.  Regarding her diabetes management, she is currently on Wegovy  2.4 mg weekly and has discontinued metformin  due to improved A1c levels, which were last recorded at 6.1. She reports a good dietary intake, noting that Wegovy  reduces her appetite, allowing her to feel full with smaller meals. Her weight has decreased from 271 pounds in July to 262 pounds, which she monitors using a digital scale.  She can walk on her toes but notes that the pain affects her daily life activities.    Past Medical History:  Diagnosis Date   Anemia    Anxiety    Aortic valve stenosis, severe    Arthritis    PAIN AND SEVERE OA LEFT KNEE ; S/P RIGHT TKA ON 02/03/12; HAS LOWER BACK PAIN-UNABLE TO STAND MORE THAN 10 MIN; ARTHRITIS ALL OVER   Asthma    Blood transfusion    2013[facility]   Breast cancer in female [facility])    Right    CAD (coronary artery disease)    Cath 2010 with DES x 1 RCA-- PT'S CARDIOLOGIST IS DR. MCALHANY   Chronic diastolic congestive heart failure (HCC)    COPD (chronic obstructive pulmonary disease) (HCC)    Mild   COPD exacerbation (HCC) 11/16/2022   Depression    Diabetes mellitus DIAGNOSED IN2010   Dyspnea    with much ambulation   Eczema    on back   GERD (gastroesophageal reflux disease)    Headache    migraines younger- rare now 02/07/21   Heart murmur    no current problems   History of hiatal hernia    History of kidney stones    passed or blasted   History of UTI 12/05/2022   Hyperlipidemia    Hypertension    Morbid obesity with body mass index of 60.0-69.9 in adult [facility])    Myocardial infarction (HCC)    PT THINKS SHE WAS DX WITH MI AT THE TIME OF HEART STENTING   Neuromuscular disorder (HCC)    bilateral arm/hands   Normocytic anemia 02/08/2012   Oxygen dependent    uses 3L oxygen night/prn   Personal history of radiation therapy    Pneumonia    Pulmonary embolism (HCC) 02/08/2012   S/P RT TOTAL KNEE ON 02/03/12--ON 02/08/12--DEVELOPED ACUTE SOB AND CHEST PAIN--AND DIAGNOSED WITH  PULMONARY EMBOLUS AND PNEUMONIA   Restless leg syndrome    Sleep apnea    Type  2 diabetes mellitus without complication, without long-term current use of insulin  (HCC) 02/08/2012   Uterine fibroid    NO PROBLEMS AT PRESENT FROM THE FIBROIDS-STATES SHE IS POST MENOPAUSAL-LAST MENSES 2010 EXCEPT FOR EPISODE THIS YR OF BLEEDING RELATED TO FIBROIDS.   Weakness    BOTH HANDS - S/P BILATERAL CARPAL TUNNEL RELEASE--BUT STILL HAS WEAKNESS--OFTEN DROPS THINGS    Current Outpatient Medications on File Prior to Visit  Medication Sig Dispense Refill   acetaminophen  (TYLENOL ) 500 MG tablet Take 1,000 mg by mouth every 6 (six) hours as needed for moderate pain.     amLODipine  (NORVASC ) 5 MG tablet Take 1 tablet (5 mg total) by mouth daily. 90 tablet 2   aspirin  EC 81 MG tablet Take 1 tablet (81 mg  total) by mouth daily. Swallow whole. 30 tablet 12   atorvastatin  (LIPITOR ) 80 MG tablet Take 1 tablet (80 mg total) by mouth daily. 90 tablet 3   Blood Pressure Monitoring (BLOOD PRESSURE CUFF) MISC Please follow insert instructions. 1 each 0   busPIRone  (BUSPAR ) 15 MG tablet Take 1 tablet (15 mg total) by mouth 2 (two) times daily. 60 tablet 3   clopidogrel  (PLAVIX ) 75 MG tablet Take 1 tablet (75 mg total) by mouth every morning. 90 tablet 3   COMIRNATY syringe Inject 0.3 mLs into the muscle once.     DULoxetine  (CYMBALTA ) 30 MG capsule Take 1 capsule (30 mg total) by mouth daily. 30 capsule 3   Evolocumab  (REPATHA  SURECLICK) 140 MG/ML SOAJ Inject 140 mg into the skin every 14 (fourteen) days. 6 mL 3   ezetimibe  (ZETIA ) 10 MG tablet Take 1 tablet by mouth once daily 90 tablet 3   Fluticasone -Umeclidin-Vilant (TRELEGY ELLIPTA ) 100-62.5-25 MCG/ACT AEPB Inhale 1 puff into the lungs daily. 28 each 3   gabapentin  (NEURONTIN ) 300 MG capsule TAKE 1 CAPSULE BY MOUTH IN THE MORNING, 1 CAPSULE IN THE AFTERNOON AND 3 CAPSULES AT BEDTIME 150 capsule 0   hydrOXYzine  (ATARAX ) 50 MG tablet Take 1 tablet (50 mg total) by mouth at bedtime as needed. 30 tablet 3   ketorolac  (ACULAR ) 0.5 % ophthalmic solution Place 1 drop into the right eye 4 (four) times daily.     losartan -hydrochlorothiazide  (HYZAAR) 50-12.5 MG tablet Take 1 tablet by mouth daily. 90 tablet 3   metFORMIN  (GLUCOPHAGE -XR) 500 MG 24 hr tablet Take 1 tablet by mouth twice daily 180 tablet 0   metoprolol  succinate (TOPROL -XL) 50 MG 24 hr tablet Take 1 tablet (50 mg total) by mouth daily. Take with or immediately following a meal.TAKE 1 TABLET BY MOUTH ONCE DAILY WITH MEALS OR  IMMEDIATELY  FOLLOWING  A  MEAL 90 tablet 2   montelukast  (SINGULAIR ) 10 MG tablet TAKE 1 TABLET BY MOUTH AT BEDTIME 90 tablet 0   ofloxacin (OCUFLOX) 0.3 % ophthalmic solution Place 1 drop into the right eye 4 (four) times daily.     prednisoLONE acetate (PRED FORTE) 1 %  ophthalmic suspension Place 1 drop into the right eye 4 (four) times daily.     traZODone  (DESYREL ) 150 MG tablet Take 1 tablet (150 mg total) by mouth at bedtime. 30 tablet 3   TWINRIX 720-20 ELU-MCG/ML injection Inject 1 mL into the muscle once.     VENTOLIN  HFA 108 (90 Base) MCG/ACT inhaler Inhale 2 puffs into the lungs every 6 (six) hours as needed for wheezing or shortness of breath. 18 g 3   WEGOVY  2.4 MG/0.75ML SOAJ SQ injection INJECT 2.4 MG INTO THE SKIN  ONCE A WEEK 4 mL 0   No current facility-administered medications on file prior to visit.    Family History  Problem Relation Age of Onset   Breast cancer Mother 62 - 48       stage IV at diagnosis   Emphysema Mother        smoked   Heart disease Mother    COPD Father        smoked   Asthma Father    Heart disease Father    Cancer Brother        Sinus   Colon cancer Neg Hx    Esophageal cancer Neg Hx    Inflammatory bowel disease Neg Hx    Liver disease Neg Hx    Pancreatic cancer Neg Hx    Rectal cancer Neg Hx    Stomach cancer Neg Hx     Social History   Socioeconomic History   Marital status: Married    Spouse name: Not on file   Number of children: 2   Years of education: Not on file   Highest education level: Not on file  Occupational History   Occupation: Disabled  Tobacco Use   Smoking status: Former    Current packs/day: 0.00    Average packs/day: 1.5 packs/day for 30.0 years (45.0 ttl pk-yrs)    Types: Cigarettes    Start date: 12/29/1970    Quit date: 12/29/2000    Years since quitting: 23.8   Smokeless tobacco: Never  Vaping Use   Vaping status: Never Used  Substance and Sexual Activity   Alcohol use: Not Currently   Drug use: Not Currently    Types: Marijuana    Comment: CBD oils through vape shops   Sexual activity: Not Currently    Birth control/protection: Surgical, Post-menopausal    Comment: tubal ligation  Other Topics Concern   Not on file  Social History Narrative   Not on file    Social Drivers of Health   Financial Resource Strain: Low Risk  (06/28/2024)   Overall Financial Resource Strain (CARDIA)    Difficulty of Paying Living Expenses: Not hard at all  Food Insecurity: No Food Insecurity (06/29/2024)   Hunger Vital Sign    Worried About Running Out of Food in the Last Year: Never true    Ran Out of Food in the Last Year: Never true  Transportation Needs: No Transportation Needs (06/29/2024)   PRAPARE - Administrator, Civil Service (Medical): No    Lack of Transportation (Non-Medical): No  Physical Activity: Inactive (01/18/2024)   Exercise Vital Sign    Days of Exercise per Week: 0 days    Minutes of Exercise per Session: 0 min  Stress: No Stress Concern Present (03/17/2024)   [employer] of Occupational Health - Occupational Stress Questionnaire    Feeling of Stress : Only a little  Social Connections: Moderately Integrated (06/28/2024)   Social Connection and Isolation Panel    Frequency of Communication with Friends and Family: More than three times a week    Frequency of Social Gatherings with Friends and Family: Twice a week    Attends Religious Services: More than 4 times per year    Active Member of [employer] or Organizations: Not on file    Attends Banker Meetings: Never    Marital Status: Married  Catering Manager Violence: Not At Risk (06/29/2024)   Humiliation, Afraid, Rape, and Kick questionnaire    Fear of Current or Ex-Partner:  No    Emotionally Abused: No    Physically Abused: No    Sexually Abused: No    Review of Systems: ROS negative except for what is noted on the assessment and plan.  Vitals:   10/24/24 1038  BP: 130/81  Pulse: 87  Temp: 98.4 F (36.9 C)  TempSrc: Oral  SpO2: 93%  Weight: 262 lb 12.8 oz (119.2 kg)  Height: 5' (1.524 m)    Physical Exam: Constitutional: well-appearing female in no acute distress Cardiovascular: regular rate and rhythm, no m/r/g Pulmonary/Chest: normal work of  breathing on room air, lungs clear to auscultation bilaterally Abdominal: soft, non-tender, non-distended MSK: normal bulk and tone, tenderness to palpation on left lateral plantar surface at base of fifth metatarsal/cuboid.  Still has full range of motion of foot.  No erythema or bruising   Assessment & Plan:   Type 2 diabetes mellitus without complication, without long-term current use of insulin  (HCC) Type 2 diabetes mellitus well-controlled with A1c of 6.0, improved from 6.1. Weight loss from 271 lbs to 262 lbs with reduced appetite on Wegovy . - Continue current management with Wegovy  2.4 mg weekly.  Left foot pain Persistent left foot pain and swelling for one month post-fall, sharp pain with walking, differential includes fracture, severe sprain, or soft tissue injury. - Order x-ray of the left foot at [facility]. - Advise rest and ice application to reduce swelling. - Prescribe anti-inflammatory gel for topical application. - Consult with Dr. Georgina, orthopedic specialist, for further evaluation and potential provision of a walking boot.   Patient discussed with Dr. Trudy Dirks Todd Argabright, M.D. [facility], PGY-3 Pager: [phone] Date 10/24/2024 Time 2:17 PM

## 2024-10-24 NOTE — Assessment & Plan Note (Signed)
 Persistent left foot pain and swelling for one month post-fall, sharp pain with walking, differential includes fracture, severe sprain, or soft tissue injury. - Order x-ray of the left foot at [facility]. - Advise rest and ice application to reduce swelling. - Prescribe anti-inflammatory gel for topical application. - Consult with Dr. Georgina, orthopedic specialist, for further evaluation and potential provision of a walking boot.

## 2024-10-24 NOTE — Assessment & Plan Note (Signed)
 Type 2 diabetes mellitus well-controlled with A1c of 6.0, improved from 6.1. Weight loss from 271 lbs to 262 lbs with reduced appetite on Wegovy . - Continue current management with Wegovy  2.4 mg weekly.

## 2024-10-24 NOTE — Patient Instructions (Addendum)
 Thank you so much for coming to the clinic today!   The radiology imaging center is [address]. You may be able to get the xray done today. I have also sent in some cream which should help. Would also recommend reaching out to Dr. Georgina as well for a boot!  If you have any questions please feel free to the call the clinic at anytime at [phone]. It was a pleasure seeing you!  Best, Dr. Dontavious Emily

## 2024-10-28 ENCOUNTER — Other Ambulatory Visit (INDEPENDENT_AMBULATORY_CARE_PROVIDER_SITE_OTHER): Payer: Self-pay

## 2024-10-28 ENCOUNTER — Encounter: Payer: Self-pay | Admitting: Physician Assistant

## 2024-10-28 ENCOUNTER — Ambulatory Visit: Admitting: Physician Assistant

## 2024-10-28 DIAGNOSIS — M79672 Pain in left foot: Secondary | ICD-10-CM

## 2024-10-28 NOTE — Progress Notes (Signed)
 Office Visit Note   Patient: [name]           Date of Birth: 20-Jun-1961           MRN: [id number] Visit Date: 10/28/2024              Requested by: Edgardo Pontiff, DO [address] [place],  [place] [address] PCP: Edgardo Pontiff, DO   Assessment & Plan: Visit Diagnoses:  1. Pain in left foot     Plan: Patient is a pleasant 63 year old woman who is 1 month status post twisting her left foot.  She had pain and bruising on the outside of her foot but thought it would get better.  She continues to have pain focal over the outside of her foot.  Findings consistent with base of fifth metatarsal minimally displaced fracture.  I will place her in a postop shoe today have her come back in in 3 weeks for new x-rays.  She is a diabetic but well-controlled.  I told her she needs to wear the shoe when she is up and about and to be mindful and careful when ambulating.  Follow-Up Instructions: Return in about 3 weeks (around 11/18/2024).   Orders:  Orders Placed This Encounter  Procedures   XR Foot Complete Left   No orders of the defined types were placed in this encounter.     Procedures: No procedures performed   Clinical Data: No additional findings.   Subjective: Chief Complaint  Patient presents with   Left Foot - Pain, Injury    Injury  Patient is 1 month status post twisting her foot.  At first she did not think much of it hoped it would feel better on her own pain is focused over the outside of her foot.  Denies any medial foot pain or midfoot pain  Review of Systems  All other systems reviewed and are negative.    Objective: Vital Signs: LMP 02/03/2012   Physical Exam Constitutional:      Appearance: Normal appearance.  Skin:    General: Skin is warm and dry.  Neurological:     General: No focal deficit present.     Mental Status: She is alert and oriented to person, place, and time.  Psychiatric:        Mood and Affect: Mood normal.         Behavior: Behavior normal.     Ortho Exam Examination of her left foot she has a palpable pulse minimal soft tissue swelling at this point she has focal pain over the base of the fifth metatarsal no ecchymosis no pain with manipulation of the midfoot or on the medial foot or ankle. Specialty Comments:  No specialty comments available.  Imaging: No results found.   PMFS History: Patient Active Problem List   Diagnosis Date Noted   Left foot pain 10/24/2024   Positional lightheadedness 09/07/2024   Acute hip pain, right 04/25/2024   Mitral stenosis 04/20/2024   Biceps tendonitis on right 09/19/2023   OA (osteoarthritis) of shoulder 09/15/2023   S/P reverse total shoulder arthroplasty, right 09/15/2023   Chronic constipation 08/15/2023   History of iron  deficiency 08/15/2023   Antiplatelet or antithrombotic long-term use 08/15/2023   Abdominal hernia 03/19/2023   Diabetic neuropathy (HCC) 08/29/2022   Vaginal lesion 08/29/2022   PTSD (post-traumatic stress disorder) 06/24/2022   GAD (generalized anxiety disorder) 06/24/2022   Major depressive disorder, recurrent episode, moderate (HCC) 06/24/2022   Urinary incontinence  06/20/2022   Intertrigo 06/20/2022   Arthritis of left shoulder region    S/P reverse total shoulder arthroplasty, 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   Healthcare maintenance 11/17/2021   Spondylolisthesis, lumbar region 02/11/2021    Class: Acute   Malignant neoplasm of upper-outer quadrant of right breast in female, estrogen receptor positive (HCC) 06/10/2018   Restless leg syndrome 02/04/2018   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   Depression 08/06/2015   Morbid obesity (HCC)    Hypertension associated with diabetes (HCC)    Aortic stenosis s/p TAVR    (HFpEF) heart failure with preserved ejection fraction (HCC)    COPD (chronic obstructive pulmonary disease) (HCC) 09/21/2013   Obstructive sleep apnea 09/21/2013   Nephrolithiasis 09/21/2013    Nocturnal hypoxemia 09/12/2013   Hyperlipidemia 11/30/2012   History of pulmonary embolus (PE) 07/09/2012   Type 2 diabetes mellitus without complication, without long-term current use of insulin  (HCC) 02/08/2012   Asthma 02/08/2012   CAD (coronary artery disease) 12/08/2011   Past Medical History:  Diagnosis Date   Anemia    Anxiety    Aortic valve stenosis, severe    Arthritis    PAIN AND SEVERE OA LEFT KNEE ; S/P RIGHT TKA ON 02/03/12; HAS LOWER BACK PAIN-UNABLE TO STAND MORE THAN 10 MIN; ARTHRITIS ALL OVER   Asthma    Blood transfusion    2013[facility]   Breast cancer in female [facility])    Right   CAD (coronary artery disease)    Cath 2010 with DES x 1 RCA-- PT'S CARDIOLOGIST IS DR. MCALHANY   Chronic diastolic congestive heart failure (HCC)    COPD (chronic obstructive pulmonary disease) (HCC)    Mild   COPD exacerbation (HCC) 11/16/2022   Depression    Diabetes mellitus DIAGNOSED IN2010   Dyspnea    with much ambulation   Eczema    on back   GERD (gastroesophageal reflux disease)    Headache    migraines younger- rare now 02/07/21   Heart murmur    no current problems   History of hiatal hernia    History of kidney stones    passed or blasted   History of UTI 12/05/2022   Hyperlipidemia    Hypertension    Morbid obesity with body mass index of 60.0-69.9 in adult [facility])    Myocardial infarction (HCC)    PT THINKS SHE WAS DX WITH MI AT THE TIME OF HEART STENTING   Neuromuscular disorder (HCC)    bilateral arm/hands   Normocytic anemia 02/08/2012   Oxygen dependent    uses 3L oxygen night/prn   Personal history of radiation therapy    Pneumonia    Pulmonary embolism (HCC) 02/08/2012   S/P RT TOTAL KNEE ON 02/03/12--ON 02/08/12--DEVELOPED ACUTE SOB AND CHEST PAIN--AND DIAGNOSED WITH  PULMONARY EMBOLUS AND PNEUMONIA   Restless leg syndrome    Sleep apnea    Type 2 diabetes mellitus without complication, without long-term current use of insulin  (HCC) 02/08/2012   Uterine  fibroid    NO PROBLEMS AT PRESENT FROM THE FIBROIDS-STATES SHE IS POST MENOPAUSAL-LAST MENSES 2010 EXCEPT FOR EPISODE THIS YR OF BLEEDING RELATED TO FIBROIDS.   Weakness    BOTH HANDS - S/P BILATERAL CARPAL TUNNEL RELEASE--BUT STILL HAS WEAKNESS--OFTEN DROPS THINGS    Family History  Problem Relation Age of Onset   Breast cancer Mother 1 - 52       stage IV at diagnosis   Emphysema Mother        smoked  Heart disease Mother    COPD Father        smoked   Asthma Father    Heart disease Father    Cancer Brother        Sinus   Colon cancer Neg Hx    Esophageal cancer Neg Hx    Inflammatory bowel disease Neg Hx    Liver disease Neg Hx    Pancreatic cancer Neg Hx    Rectal cancer Neg Hx    Stomach cancer Neg Hx     Past Surgical History:  Procedure Laterality Date   BACK SURGERY  02/11/2021   Dr Lucilla   BICEPT TENODESIS Right 09/15/2023   Procedure: BICEPS TENODESIS;  Surgeon: Addie Cordella Hamilton, MD;  Location[facility] OR;  Service: Orthopedics;  Laterality: Right;   BREAST BIOPSY Right 06/04/2018   BREAST LUMPECTOMY Right 06/2018   BREAST LUMPECTOMY WITH RADIOACTIVE SEED AND SENTINEL LYMPH NODE BIOPSY Right 07/19/2018   Procedure: BREAST LUMPECTOMY WITH RADIOACTIVE SEED AND SENTINEL LYMPH NODE BIOPSY;  Surgeon: Ethyl Lenis, MD;  Location: [facility] OR;  Service: General;  Laterality: Right;   CARPAL TUNNEL RELEASE     Bilateral   CHOLECYSTECTOMY     COLONOSCOPY  2010   CORONARY ANGIOPLASTY     2010 has stent in place   CYSTOSCOPY W/ RETROGRADES Right 09/21/2013   Procedure: CYSTOSCOPY WITH RIGHT RETROGRADE PYELOGRAM RIGHT DOUBLE J STENT ;  Surgeon: Donnice Gwenyth Brooks, MD;  Location: [facility] ORS;  Service: Urology;  Laterality: Right;   CYSTOSCOPY W/ URETERAL STENT PLACEMENT Right 11/16/2022   Procedure: CYSTOSCOPY WITH RETROGRADE PYELOGRAM/URETERAL STENT PLACEMENT;  Surgeon: Lovie Arlyss CROME, MD;  Location: [facility] OR;  Service: Urology;  Laterality: Right;   CYSTOSCOPY WITH RETROGRADE  PYELOGRAM, URETEROSCOPY AND STENT PLACEMENT Right 01/13/2023   Procedure: CYSTOSCOPY WITH RETROGRADE PYELOGRAM, URETEROSCOPY AND STENT EXCHANGE;  Surgeon: Roseann Adine PARAS., MD;  Location: [facility] ORS;  Service: Urology;  Laterality: Right;   CYSTOSCOPY WITH URETEROSCOPY AND STENT PLACEMENT Right 10/25/2013   Procedure: CYSTOSCOPY RIGHT URETEROSCOPY HOLMIUM LASER LITHO AND STENT PLACEMENT;  Surgeon: Donnice Gwenyth Brooks, MD;  Location: [facility];  Service: Urology;  Laterality: Right;   HERNIA REPAIR  1995   umbilical ,   HOLMIUM LASER APPLICATION Right 01/13/2023   Procedure: HOLMIUM LASER APPLICATION;  Surgeon: Roseann Adine PARAS., MD;  Location: [facility];  Service: Urology;  Laterality: Right;   INTRAOPERATIVE TRANSESOPHAGEAL ECHOCARDIOGRAM N/A 12/12/2014   Procedure: INTRAOPERATIVE TRANSESOPHAGEAL ECHOCARDIOGRAM;  Surgeon: Lonni JONETTA Cash, MD;  Location: [facility] OR;  Service: Cardiovascular;  Laterality: N/A;   KNEE ARTHROPLASTY  02/03/2012   Procedure: COMPUTER ASSISTED TOTAL KNEE ARTHROPLASTY;  Surgeon: Lonni CINDERELLA Poli, MD;  Location: [facility] OR;  Service: Orthopedics;  Laterality: Right;  Right total knee arthroplasty   LEFT AND RIGHT HEART CATHETERIZATION WITH CORONARY ANGIOGRAM N/A 03/17/2013   Procedure: LEFT AND RIGHT HEART CATHETERIZATION WITH CORONARY ANGIOGRAM;  Surgeon: Lonni JONETTA Cash, MD;  Location: [facility] CATH LAB;  Service: Cardiovascular;  Laterality: N/A;   LEFT AND RIGHT HEART CATHETERIZATION WITH CORONARY/GRAFT ANGIOGRAM N/A 09/14/2014   Procedure: LEFT AND RIGHT HEART CATHETERIZATION WITH EL BILE;  Surgeon: Lonni JONETTA Cash, MD;  Location[facility] LAB;  Service: Cardiovascular;  Laterality: N/A;   REVERSE SHOULDER ARTHROPLASTY Left 03/06/2022   Procedure: LEFT SHOULDER REPLACEMENT APPLICATION OF WOUND VAC;  Surgeon: Addie Cordella Hamilton, MD;  Location: [facility] OR;  Service: Orthopedics;  Laterality: Left;   REVERSE SHOULDER ARTHROPLASTY Right 09/15/2023    Procedure: REVERSE SHOULDER  ARTHROPLASTY;  Surgeon: Addie Cordella Hamilton, MD;  Location: [facility] OR;  Service: Orthopedics;  Laterality: Right;   TEE WITHOUT CARDIOVERSION N/A 03/14/2013   Procedure: TRANSESOPHAGEAL ECHOCARDIOGRAM (TEE);  Surgeon: Redell GORMAN Shallow, MD;  Location: [facility] ENDOSCOPY;  Service: Cardiovascular;  Laterality: N/A;   TEE WITHOUT CARDIOVERSION N/A 11/14/2014   Procedure: TRANSESOPHAGEAL ECHOCARDIOGRAM (TEE);  Surgeon: Redell GORMAN Shallow, MD;  Location: [facility] ENDOSCOPY;  Service: Cardiovascular;  Laterality: N/A;   TONSILLECTOMY     maybe as a child- does not know   TOTAL KNEE ARTHROPLASTY  09/10/2012   Procedure: TOTAL KNEE ARTHROPLASTY;  Surgeon: Lonni CINDERELLA Poli, MD;  Location: [facility];  Service: Orthopedics;  Laterality: Left;   TOTAL KNEE REVISION Right 07/15/2013   Procedure: REVISION ARTHROPLASTY RIGHT KNEE;  Surgeon: Lonni CINDERELLA Poli, MD;  Location: [facility] OR[facility];  Service: Orthopedics;  Laterality: Right;   TRANSCATHETER AORTIC VALVE REPLACEMENT, TRANSFEMORAL N/A 12/12/2014   Procedure: TRANSCATHETER AORTIC VALVE REPLACEMENT, TRANSFEMORAL;  Surgeon: Lonni JONETTA Cash, MD;  Location: [facility] OR;  Service: Cardiovascular;  Laterality: N/A;   TRIGGER FINGER RELEASE  09/10/2012   Procedure: RELEASE TRIGGER FINGER/A-1 PULLEY;  Surgeon: Lonni CINDERELLA Poli, MD;  Location: [facility] ORS;  Service: Orthopedics;  Laterality: Right;  Right Ring Finger   TUBAL LIGATION     Social History   Occupational History   Occupation: Disabled  Tobacco Use   Smoking status: Former    Current packs/day: 0.00    Average packs/day: 1.5 packs/day for 30.0 years (45.0 ttl pk-yrs)    Types: Cigarettes    Start date: 12/29/1970    Quit date: 12/29/2000    Years since quitting: 23.8   Smokeless tobacco: Never  Vaping Use   Vaping status: Never Used  Substance and Sexual Activity   Alcohol use: Not Currently   Drug use: Not Currently    Types: Marijuana    Comment: CBD oils through vape shops    Sexual activity: Not Currently    Birth control/protection: Surgical, Post-menopausal    Comment: tubal ligation

## 2024-10-30 ENCOUNTER — Other Ambulatory Visit: Payer: Self-pay | Admitting: Student

## 2024-10-31 ENCOUNTER — Encounter: Payer: Self-pay | Admitting: Radiology

## 2024-10-31 NOTE — Telephone Encounter (Signed)
 Medication sent to pharmacy

## 2024-11-01 ENCOUNTER — Other Ambulatory Visit: Payer: Self-pay

## 2024-11-02 NOTE — Progress Notes (Signed)
 Internal Medicine Clinic Attending  Case discussed with the resident at the time of the visit.  We reviewed the resident's history and exam and pertinent patient test results.  I agree with the assessment, diagnosis, and plan of care documented in the resident's note.

## 2024-11-07 ENCOUNTER — Telehealth: Payer: Self-pay

## 2024-11-07 ENCOUNTER — Other Ambulatory Visit: Payer: Self-pay | Admitting: Student

## 2024-11-07 MED ORDER — WEGOVY 2.4 MG/0.75ML ~~LOC~~ SOAJ: 2.4000 mg | SUBCUTANEOUS | 3 refills | Status: DC

## 2024-11-07 NOTE — Addendum Note (Signed)
 Addended by: Corbin Falck on: 11/07/2024 02:43 PM   Modules accepted: Orders

## 2024-11-07 NOTE — Telephone Encounter (Addendum)
 I called the pharmacy and spoke to [facility]. Rema stated that the patient does not need a PA for Wegovy  , she needs a new rx, the last rx was sent with no refills.SABRA Maw also checked to see if any of her rx were flagging in the system needing DEA numbers, he stated he does not see anything that is alerting him at the moment.  I called and spoke to the patient she is aware that she does not need a new pa she just needs a refill. I asked her to specify which rx was she talking about that needs a DEA number patient stated it was a misunderstanding she was talking about needing a pa for Wegovy . Can we refill her Wegovy ? Thanks.

## 2024-11-07 NOTE — Telephone Encounter (Signed)
 I called and spoke to the pharmacy, the pharmacists stated they are getting the rx ready now. The pharmacists clarified that the medication does not need a new PA and will be $0. I called and spoke to the patient she is aware of the following information. Thanks!

## 2024-11-07 NOTE — Telephone Encounter (Signed)
 Copied from CRM [id number]. Topic: Clinical - Prescription Issue >> Nov 07, 2024 11:21 AM [name] wrote: Reason for CRM: [id number]  2.4 MG/0.75ML SOAJ SQ injection patient stated that the pharmacy has advised her she is needing the PA for this prescription. She also stated that she keeps having issues with the residents sending over the prescriptions and some are not able to utilized there DEA # in order to fill. The refill was sent on by the pharmacy on 11/10/ and 11/06/ with no response.

## 2024-11-18 ENCOUNTER — Ambulatory Visit: Admitting: Physician Assistant

## 2024-11-22 ENCOUNTER — Other Ambulatory Visit: Payer: Self-pay

## 2024-11-22 DIAGNOSIS — J441 Chronic obstructive pulmonary disease with (acute) exacerbation: Secondary | ICD-10-CM

## 2024-11-22 MED ORDER — VENTOLIN HFA 108 (90 BASE) MCG/ACT IN AERS: 2.0000 | INHALATION_SPRAY | Freq: Four times a day (QID) | RESPIRATORY_TRACT | 3 refills | Status: AC | PRN

## 2024-11-22 NOTE — Telephone Encounter (Signed)
 Medication sent to pharmacy

## 2024-11-27 ENCOUNTER — Other Ambulatory Visit: Payer: Self-pay

## 2024-11-28 NOTE — Telephone Encounter (Signed)
 Medication sent to pharmacy

## 2024-12-01 ENCOUNTER — Ambulatory Visit
Admission: RE | Admit: 2024-12-01 | Discharge: 2024-12-01 | Disposition: A | Source: Ambulatory Visit | Attending: Nurse Practitioner | Admitting: Nurse Practitioner

## 2024-12-01 ENCOUNTER — Other Ambulatory Visit: Payer: Self-pay | Admitting: Nurse Practitioner

## 2024-12-01 ENCOUNTER — Encounter: Payer: Self-pay | Admitting: Nurse Practitioner

## 2024-12-01 DIAGNOSIS — M25551 Pain in right hip: Secondary | ICD-10-CM

## 2024-12-09 ENCOUNTER — Ambulatory Visit

## 2024-12-09 ENCOUNTER — Telehealth (INDEPENDENT_AMBULATORY_CARE_PROVIDER_SITE_OTHER): Admitting: Psychiatry

## 2024-12-09 ENCOUNTER — Encounter (HOSPITAL_COMMUNITY): Payer: Self-pay | Admitting: Psychiatry

## 2024-12-09 DIAGNOSIS — F411 Generalized anxiety disorder: Secondary | ICD-10-CM

## 2024-12-09 DIAGNOSIS — F4310 Post-traumatic stress disorder, unspecified: Secondary | ICD-10-CM

## 2024-12-09 DIAGNOSIS — F32A Depression, unspecified: Secondary | ICD-10-CM

## 2024-12-09 MED ORDER — DULOXETINE HCL 30 MG PO CPEP: 30.0000 mg | ORAL_CAPSULE | Freq: Two times a day (BID) | ORAL | 3 refills | Status: DC

## 2024-12-09 MED ORDER — TRAZODONE HCL 100 MG PO TABS: 200.0000 mg | ORAL_TABLET | Freq: Every day | ORAL | 3 refills | Status: DC

## 2024-12-09 MED ORDER — HYDROXYZINE HCL 50 MG PO TABS: 50.0000 mg | ORAL_TABLET | Freq: Every evening | ORAL | 3 refills | Status: DC | PRN

## 2024-12-09 MED ORDER — BUSPIRONE HCL 15 MG PO TABS: 15.0000 mg | ORAL_TABLET | Freq: Two times a day (BID) | ORAL | 3 refills | Status: DC

## 2024-12-09 NOTE — Progress Notes (Signed)
 BH MD/PA/NP OP Progress Note Virtual Visit via Video Note  I connected with [name] on 12/09/2024 at  9:30 AM EST by a video enabled telemedicine application and verified that I am speaking with the correct person using two identifiers.  Location: Patient: Home Provider: Clinic   I discussed the limitations of evaluation and management by telemedicine and the availability of in person appointments. The patient expressed understanding and agreed to proceed.  I provided 30 minutes of non-face-to-face time during this encounter.   12/09/2024 8:45 AM [name]  MRN:  [id number]  Chief Complaint: I am so stressed out  HPI:  63 year old female seen today for follow-up psychiatric evaluation.  She has a psychiatric history of anxiety, depression, and PTSD.  Currently she is being managed on BuSpar  15 mg 3 times daily (noting that she takes it twice daily), Cymbalta  30 mg daily, hydroxyzine  50 mg nightly as needed, and trazodone  150 mg nightly as needed.  She notes the medications are somewhat effective in managing her psychiatric condition.   Today she was well-groomed, pleasant, cooperative, and engaged in conversation.  She informed clinical research associate that she is stressed out. She notes that her sister in law passed away in the summer and notes that on Thanksgiving she and her family did not get together. She also also notes that she is thinking about the Christmas Holiday and her brother in law who lost his wife. She is worried about getting christmas presents. Recently she notes that her sleep patterns have been off during the holiday season as she is anxious. Patient notes that she has been isolating in the house because of  the weather.   Patient notes that the above exacerbates her anxiety and depression. Today provider conducted a GAD-7 and patient scored a 19, at her last visit she scored a 2.  Provider also conducted PHQ-9 and patient scored an 8, at her last visit she scored a  1.  She endorsed adequate appetite.  She informed clinical research associate that she has lost 10 pounds in the last few months with Wegovy .  Today she denies SI/HI/VAH, mania, paranoia.    Patient informed writer that she continues to have hip and leg pain.  Today Cymbalta  30 mg increased to 30 mg twice daily to help manage anxiety, depression, and pain.  Trazodone  increased from 150 mg nightly as needed to 200 mg nightly as needed.  Patient also informed that she can take 2 hydroxyzine  at night to help manage sleep.  Provider also informed patient that she could cut hydroxyzine  in half and take some during the day to help manage anxiety.  She will continue her other medication as prescribed.  No other concerns noted at this time.    Visit Diagnosis:    ICD-10-CM   1. GAD (generalized anxiety disorder)  F41.1 busPIRone  (BUSPAR ) 15 MG tablet    traZODone  (DESYREL ) 100 MG tablet    DULoxetine  (CYMBALTA ) 30 MG capsule    hydrOXYzine  (ATARAX ) 50 MG tablet    2. PTSD (post-traumatic stress disorder)  F43.10 busPIRone  (BUSPAR ) 15 MG tablet    traZODone  (DESYREL ) 100 MG tablet    DULoxetine  (CYMBALTA ) 30 MG capsule    3. Mild depression  F32.A busPIRone  (BUSPAR ) 15 MG tablet    traZODone  (DESYREL ) 100 MG tablet    DULoxetine  (CYMBALTA ) 30 MG capsule    hydrOXYzine  (ATARAX ) 50 MG tablet            Past Psychiatric History: anxiety, depression, and PTSD  Past Medical History:  Past Medical History:  Diagnosis Date   Anemia    Anxiety    Aortic valve stenosis, severe    Arthritis    PAIN AND SEVERE OA LEFT KNEE ; S/P RIGHT TKA ON 02/03/12; HAS LOWER BACK PAIN-UNABLE TO STAND MORE THAN 10 MIN; ARTHRITIS ALL OVER   Asthma    Blood transfusion    2013[facility]   Breast cancer in female [facility])    Right   CAD (coronary artery disease)    Cath 2010 with DES x 1 RCA-- PT'S CARDIOLOGIST IS DR. MCALHANY   Chronic diastolic congestive heart failure (HCC)    COPD (chronic obstructive pulmonary disease) (HCC)     Mild   COPD exacerbation (HCC) 11/16/2022   Depression    Diabetes mellitus DIAGNOSED IN2010   Dyspnea    with much ambulation   Eczema    on back   GERD (gastroesophageal reflux disease)    Headache    migraines younger- rare now 02/07/21   Heart murmur    no current problems   History of hiatal hernia    History of kidney stones    passed or blasted   History of UTI 12/05/2022   Hyperlipidemia    Hypertension    Morbid obesity with body mass index of 60.0-69.9 in adult [facility])    Myocardial infarction (HCC)    PT THINKS SHE WAS DX WITH MI AT THE TIME OF HEART STENTING   Neuromuscular disorder (HCC)    bilateral arm/hands   Normocytic anemia 02/08/2012   Oxygen dependent    uses 3L oxygen night/prn   Personal history of radiation therapy    Pneumonia    Pulmonary embolism (HCC) 02/08/2012   S/P RT TOTAL KNEE ON 02/03/12--ON 02/08/12--DEVELOPED ACUTE SOB AND CHEST PAIN--AND DIAGNOSED WITH  PULMONARY EMBOLUS AND PNEUMONIA   Restless leg syndrome    Sleep apnea    Type 2 diabetes mellitus without complication, without long-term current use of insulin  (HCC) 02/08/2012   Uterine fibroid    NO PROBLEMS AT PRESENT FROM THE FIBROIDS-STATES SHE IS POST MENOPAUSAL-LAST MENSES 2010 EXCEPT FOR EPISODE THIS YR OF BLEEDING RELATED TO FIBROIDS.   Weakness    BOTH HANDS - S/P BILATERAL CARPAL TUNNEL RELEASE--BUT STILL HAS WEAKNESS--OFTEN DROPS THINGS    Past Surgical History:  Procedure Laterality Date   BACK SURGERY  02/11/2021   Dr Lucilla   BICEPT TENODESIS Right 09/15/2023   Procedure: BICEPS TENODESIS;  Surgeon: Addie Cordella Hamilton, MD;  Location: [facility] OR;  Service: Orthopedics;  Laterality: Right;   BREAST BIOPSY Right 06/04/2018   BREAST LUMPECTOMY Right 06/2018   BREAST LUMPECTOMY WITH RADIOACTIVE SEED AND SENTINEL LYMPH NODE BIOPSY Right 07/19/2018   Procedure: BREAST LUMPECTOMY WITH RADIOACTIVE SEED AND SENTINEL LYMPH NODE BIOPSY;  Surgeon: Ethyl Lenis, MD;  Location: [facility] OR;   Service: General;  Laterality: Right;   CARPAL TUNNEL RELEASE     Bilateral   CHOLECYSTECTOMY     COLONOSCOPY  2010   CORONARY ANGIOPLASTY     2010 has stent in place   CYSTOSCOPY W/ RETROGRADES Right 09/21/2013   Procedure: CYSTOSCOPY WITH RIGHT RETROGRADE PYELOGRAM RIGHT DOUBLE J STENT ;  Surgeon: Donnice Gwenyth Brooks, MD;  Location: [facility] ORS;  Service: Urology;  Laterality: Right;   CYSTOSCOPY W/ URETERAL STENT PLACEMENT Right 11/16/2022   Procedure: CYSTOSCOPY WITH RETROGRADE PYELOGRAM/URETERAL STENT PLACEMENT;  Surgeon: Lovie Arlyss CROME, MD;  Location: [facility] OR;  Service: Urology;  Laterality: Right;  CYSTOSCOPY WITH RETROGRADE PYELOGRAM, URETEROSCOPY AND STENT PLACEMENT Right 01/13/2023   Procedure: CYSTOSCOPY WITH RETROGRADE PYELOGRAM, URETEROSCOPY AND STENT EXCHANGE;  Surgeon: Roseann Adine PARAS., MD;  Location: [facility];  Service: Urology;  Laterality: Right;   CYSTOSCOPY WITH URETEROSCOPY AND STENT PLACEMENT Right 10/25/2013   Procedure: CYSTOSCOPY RIGHT URETEROSCOPY HOLMIUM LASER LITHO AND STENT PLACEMENT;  Surgeon: Donnice Gwenyth Brooks, MD;  Location: [facility];  Service: Urology;  Laterality: Right;   HERNIA REPAIR  1995   umbilical ,   HOLMIUM LASER APPLICATION Right 01/13/2023   Procedure: HOLMIUM LASER APPLICATION;  Surgeon: Roseann Adine PARAS., MD;  Location: [facility];  Service: Urology;  Laterality: Right;   INTRAOPERATIVE TRANSESOPHAGEAL ECHOCARDIOGRAM N/A 12/12/2014   Procedure: INTRAOPERATIVE TRANSESOPHAGEAL ECHOCARDIOGRAM;  Surgeon: Lonni JONETTA Cash, MD;  Location: [facility] OR;  Service: Cardiovascular;  Laterality: N/A;   KNEE ARTHROPLASTY  02/03/2012   Procedure: COMPUTER ASSISTED TOTAL KNEE ARTHROPLASTY;  Surgeon: Lonni CINDERELLA Poli, MD;  Location[facility] OR;  Service: Orthopedics;  Laterality: Right;  Right total knee arthroplasty   LEFT AND RIGHT HEART CATHETERIZATION WITH CORONARY ANGIOGRAM N/A 03/17/2013   Procedure: LEFT AND RIGHT HEART CATHETERIZATION WITH  CORONARY ANGIOGRAM;  Surgeon: Lonni JONETTA Cash, MD;  Location: [facility] CATH LAB;  Service: Cardiovascular;  Laterality: N/A;   LEFT AND RIGHT HEART CATHETERIZATION WITH CORONARY/GRAFT ANGIOGRAM N/A 09/14/2014   Procedure: LEFT AND RIGHT HEART CATHETERIZATION WITH EL BILE;  Surgeon: Lonni JONETTA Cash, MD;  Location: [facility] CATH LAB;  Service: Cardiovascular;  Laterality: N/A;   REVERSE SHOULDER ARTHROPLASTY Left 03/06/2022   Procedure: LEFT SHOULDER REPLACEMENT APPLICATION OF WOUND VAC;  Surgeon: Addie Cordella Hamilton, MD;  Location: [facility] OR;  Service: Orthopedics;  Laterality: Left;   REVERSE SHOULDER ARTHROPLASTY Right 09/15/2023   Procedure: REVERSE SHOULDER ARTHROPLASTY;  Surgeon: Addie Cordella Hamilton, MD;  Location: [facility] OR;  Service: Orthopedics;  Laterality: Right;   TEE WITHOUT CARDIOVERSION N/A 03/14/2013   Procedure: TRANSESOPHAGEAL ECHOCARDIOGRAM (TEE);  Surgeon: Redell GORMAN Shallow, MD;  Location: [facility] ENDOSCOPY;  Service: Cardiovascular;  Laterality: N/A;   TEE WITHOUT CARDIOVERSION N/A 11/14/2014   Procedure: TRANSESOPHAGEAL ECHOCARDIOGRAM (TEE);  Surgeon: Redell GORMAN Shallow, MD;  Location: [facility] ENDOSCOPY;  Service: Cardiovascular;  Laterality: N/A;   TONSILLECTOMY     maybe as a child- does not know   TOTAL KNEE ARTHROPLASTY  09/10/2012   Procedure: TOTAL KNEE ARTHROPLASTY;  Surgeon: Lonni CINDERELLA Poli, MD;  [facility];  Service: Orthopedics;  Laterality: Left;   TOTAL KNEE REVISION Right 07/15/2013   Procedure: REVISION ARTHROPLASTY RIGHT KNEE;  Surgeon: Lonni CINDERELLA Poli, MD;  Location[facility];  Service: Orthopedics;  Laterality: Right;   TRANSCATHETER AORTIC VALVE REPLACEMENT, TRANSFEMORAL N/A 12/12/2014   Procedure: TRANSCATHETER AORTIC VALVE REPLACEMENT, TRANSFEMORAL;  Surgeon: Lonni JONETTA Cash, MD;  [facility] OR;  Service: Cardiovascular;  Laterality: N/A;   TRIGGER FINGER RELEASE  09/10/2012   Procedure: RELEASE TRIGGER FINGER/A-1 PULLEY;  Surgeon:  Lonni CINDERELLA Poli, MD;  Location: [facility]S;  Service: Orthopedics;  Laterality: Right;  Right Ring Finger   TUBAL LIGATION      Family Psychiatric History: Mother alcohol use and brother intellectual delay   Family History:  Family History  Problem Relation Age of Onset   Breast cancer Mother 94 - 49       stage IV at diagnosis   Emphysema Mother        smoked   Heart disease Mother    COPD Father        smoked  Asthma Father    Heart disease Father    Cancer Brother        Sinus   Colon cancer Neg Hx    Esophageal cancer Neg Hx    Inflammatory bowel disease Neg Hx    Liver disease Neg Hx    Pancreatic cancer Neg Hx    Rectal cancer Neg Hx    Stomach cancer Neg Hx     Social History:  Social History   Socioeconomic History   Marital status: Married    Spouse name: Not on file   Number of children: 2   Years of education: Not on file   Highest education level: Not on file  Occupational History   Occupation: Disabled  Tobacco Use   Smoking status: Former    Current packs/day: 0.00    Average packs/day: 1.5 packs/day for 30.0 years (45.0 ttl pk-yrs)    Types: Cigarettes    Start date: 12/29/1970    Quit date: 12/29/2000    Years since quitting: 23.9   Smokeless tobacco: Never  Vaping Use   Vaping status: Never Used  Substance and Sexual Activity   Alcohol use: Not Currently   Drug use: Not Currently    Types: Marijuana    Comment: CBD oils through vape shops   Sexual activity: Not Currently    Birth control/protection: Surgical, Post-menopausal    Comment: tubal ligation  Other Topics Concern   Not on file  Social History Narrative   Not on file   Social Drivers of Health   Tobacco Use: Medium Risk (12/09/2024)   Patient History    Smoking Tobacco Use: Former    Smokeless Tobacco Use: Never    Passive Exposure: Not on Actuary Strain: Low Risk (06/28/2024)   Overall Financial Resource Strain (CARDIA)    Difficulty of Paying Living  Expenses: Not hard at all  Food Insecurity: No Food Insecurity (06/29/2024)   Epic    Worried About Radiation Protection Practitioner of Food in the Last Year: Never true    Ran Out of Food in the Last Year: Never true  Transportation Needs: No Transportation Needs (06/29/2024)   Epic    Lack of Transportation (Medical): No    Lack of Transportation (Non-Medical): No  Physical Activity: Inactive (01/18/2024)   Exercise Vital Sign    Days of Exercise per Week: 0 days    Minutes of Exercise per Session: 0 min  Stress: No Stress Concern Present (03/17/2024)   [employer] of Occupational Health - Occupational Stress Questionnaire    Feeling of Stress : Only a little  Social Connections: Moderately Integrated (06/28/2024)   Social Connection and Isolation Panel    Frequency of Communication with Friends and Family: More than three times a week    Frequency of Social Gatherings with Friends and Family: Twice a week    Attends Religious Services: More than 4 times per year    Active Member of Clubs or Organizations: Not on file    Attends Banker Meetings: Never    Marital Status: Married  Depression (PHQ2-9): Medium Risk (12/09/2024)   Depression (PHQ2-9)    PHQ-2 Score: 8  Alcohol Screen: Low Risk (02/18/2024)   Alcohol Screen    Last Alcohol Screening Score (AUDIT): 0  Housing: Low Risk (06/29/2024)   Epic    Unable to Pay for Housing in the Last Year: No    Number of Times Moved in the Last Year: 0    Homeless  in the Last Year: No  Utilities: Not At Risk (06/29/2024)   Epic    Threatened with loss of utilities: No  Health Literacy: Adequate Health Literacy (01/18/2024)   B1300 Health Literacy    Frequency of need for help with medical instructions: Never    Allergies: No Known Allergies  Metabolic Disorder Labs: Lab Results  Component Value Date   HGBA1C 6.0 10/24/2024   MPG 157 09/09/2023   MPG 143 01/08/2023   No results found for: PROLACTIN Lab Results  Component Value Date    CHOL 170 09/07/2024   TRIG 126 09/07/2024   HDL 61 09/07/2024   CHOLHDL 2.8 09/07/2024   VLDL 20 12/28/2015   LDLCALC 87 09/07/2024   LDLCALC 101 (H) 12/25/2022   Lab Results  Component Value Date   TSH 2.820 09/25/2020   TSH 2.918 09/21/2013    Therapeutic Level Labs: No results found for: LITHIUM No results found for: VALPROATE No results found for: CBMZ  Current Medications: Current Outpatient Medications  Medication Sig Dispense Refill   acetaminophen  (TYLENOL ) 500 MG tablet Take 1,000 mg by mouth every 6 (six) hours as needed for moderate pain.     amLODipine  (NORVASC ) 5 MG tablet Take 1 tablet (5 mg total) by mouth daily. 90 tablet 2   aspirin  EC 81 MG tablet Take 1 tablet (81 mg total) by mouth daily. Swallow whole. 30 tablet 12   atorvastatin  (LIPITOR ) 80 MG tablet Take 1 tablet (80 mg total) by mouth daily. 90 tablet 3   Blood Pressure Monitoring (BLOOD PRESSURE CUFF) MISC Please follow insert instructions. 1 each 0   busPIRone  (BUSPAR ) 15 MG tablet Take 1 tablet (15 mg total) by mouth 2 (two) times daily. 60 tablet 3   clopidogrel  (PLAVIX ) 75 MG tablet Take 1 tablet (75 mg total) by mouth every morning. 90 tablet 3   COMIRNATY syringe Inject 0.3 mLs into the muscle once.     diclofenac  Sodium (VOLTAREN ) 1 % GEL Apply 4 g topically 4 (four) times daily. 20 g 0   DULoxetine  (CYMBALTA ) 30 MG capsule Take 1 capsule (30 mg total) by mouth 2 (two) times daily. 60 capsule 3   Evolocumab  (REPATHA  SURECLICK) 140 MG/ML SOAJ Inject 140 mg into the skin every 14 (fourteen) days. 6 mL 3   ezetimibe  (ZETIA ) 10 MG tablet Take 1 tablet by mouth once daily 90 tablet 3   Fluticasone -Umeclidin-Vilant (TRELEGY ELLIPTA ) 100-62.5-25 MCG/ACT AEPB INHALE 1 PUFF ONCE DAILY INTO  THE  LUNGS 60 each 3   gabapentin  (NEURONTIN ) 300 MG capsule TAKE 1 CAPSULE BY MOUTH IN THE MORNING, 1 CAPSULE IN THE AFTERNOON AND 3 CAPSULES AT BEDTIME 150 capsule 0   hydrOXYzine  (ATARAX ) 50 MG tablet Take 1  tablet (50 mg total) by mouth at bedtime as needed. 30 tablet 3   ketorolac  (ACULAR ) 0.5 % ophthalmic solution Place 1 drop into the right eye 4 (four) times daily.     losartan -hydrochlorothiazide  (HYZAAR) 50-12.5 MG tablet Take 1 tablet by mouth daily. 90 tablet 3   metFORMIN  (GLUCOPHAGE -XR) 500 MG 24 hr tablet Take 1 tablet by mouth twice daily 180 tablet 0   metoprolol  succinate (TOPROL -XL) 50 MG 24 hr tablet Take 1 tablet (50 mg total) by mouth daily. Take with or immediately following a meal.TAKE 1 TABLET BY MOUTH ONCE DAILY WITH MEALS OR  IMMEDIATELY  FOLLOWING  A  MEAL 90 tablet 2   montelukast  (SINGULAIR ) 10 MG tablet TAKE 1 TABLET BY MOUTH AT BEDTIME 90  tablet 0   ofloxacin (OCUFLOX) 0.3 % ophthalmic solution Place 1 drop into the right eye 4 (four) times daily.     prednisoLONE acetate (PRED FORTE) 1 % ophthalmic suspension Place 1 drop into the right eye 4 (four) times daily.     semaglutide -weight management (WEGOVY ) 2.4 MG/0.75ML SOAJ SQ injection Inject 2.4 mg into the skin once a week. 4 mL 3   traZODone  (DESYREL ) 100 MG tablet Take 2 tablets (200 mg total) by mouth at bedtime. 60 tablet 3   TWINRIX 720-20 ELU-MCG/ML injection Inject 1 mL into the muscle once.     VENTOLIN  HFA 108 (90 Base) MCG/ACT inhaler Inhale 2 puffs into the lungs every 6 (six) hours as needed for wheezing or shortness of breath. 18 g 3   WEGOVY  2.4 MG/0.75ML SOAJ SQ injection INJECT 2.4 MG INTO THE SKIN ONCE WEEKLY 4 mL 0   WEGOVY  2.4 MG/0.75ML SOAJ SQ injection INJECT 1 DOSE (2.4MG ) SUBCUTANEOUSLY ONCE A WEEK 4 mL 0   No current facility-administered medications for this visit.     Musculoskeletal: Strength & Muscle Tone: within normal limits and Telehealth visit Gait & Station: normal, Telehealth Patient leans: N/A  Psychiatric Specialty Exam: Review of Systems  Last menstrual period 02/03/2012.There is no height or weight on file to calculate BMI.  General Appearance: Well Groomed  Eye Contact:   Good  Speech:  Clear and Coherent and Normal Rate  Volume:  Normal  Mood:  Euthymic  Affect:  Appropriate and Congruent  Thought Process:  Coherent, Goal Directed, and Linear  Orientation:  Full (Time, Place, and Person)  Thought Content: WDL and Logical   Suicidal Thoughts:  No  Homicidal Thoughts:  No  Memory:  Immediate;   Good Recent;   Good Remote;   Good  Judgement:  Good  Insight:  Good  Psychomotor Activity:  Normal  Concentration:  Concentration: Good and Attention Span: Good  Recall:  Good  Fund of Knowledge: Good  Language: Good  Akathisia:  No  Handed:  Right  AIMS (if indicated): not done  Assets:  Communication Skills Desire for Improvement Financial Resources/Insurance Housing Leisure Time Physical Health Social Support  ADL's:  Intact  Cognition: WNL  Sleep:  Good   Screenings: GAD-7    Flowsheet Row Video Visit from 12/09/2024 in [facility] Video Visit from 09/16/2024 in [facility] Video Visit from 07/29/2024 in [facility] Office Visit from 06/28/2024 in [facility] Of [place]. [facility] Video Visit from 05/11/2024 in [facility]  Total GAD-7 Score 19 2 2  0 2   PHQ2-9    Flowsheet Row Video Visit from 12/09/2024 in [facility] Video Visit from 09/16/2024 in [facility] Office Visit from 09/07/2024 in [facility] - [facility] Dept Of [place]. [facility] Video Visit from 07/29/2024 in [facility] Patient Outreach Telephone from 06/29/2024 in [facility] [place]PART[facility]  PHQ-2 Total Score 2 0 0 2 0  PHQ-9 Total Score 8 1 -- 3 --   Flowsheet Row UC from 07/29/2024 in [facility]) Most recent reading at 07/29/2024  5:11 PM Video Visit from 07/29/2024 in [facility] Most recent reading at 07/29/2024  1:48 PM Admission (Discharged) from 09/15/2023 in [facility] Most recent reading at 09/15/2023  6:06 AM  C-SSRS RISK CATEGORY No Risk No Risk No Risk     Assessment and Plan: Patient reports that her anxiety has increased due to the holiday season.  She also notes that she continues to have hip and leg pain.  Patient notes that her sleep has been somewhat poor.Today Cymbalta  30 mg increased to 30 mg twice daily to help manage anxiety, depression, and pain.  Trazodone  increased from 150 mg nightly as needed to 200 mg nightly as needed.  Patient also informed that she can take 2 hydroxyzine  at night to help manage sleep.  Provider also informed patient that she could cut hydroxyzine  in half and take some during the day to help manage anxiety.  She will continue her other medication as prescribed.  1. GAD (generalized anxiety disorder)  Continue- busPIRone  (BUSPAR ) 15 MG tablet; Take 1 tablet (15 mg total) by mouth 2 (two) times daily.  Dispense: 60 tablet; Refill: 3 Increased- traZODone  (DESYREL ) 100 MG tablet; Take 2 tablets (200 mg total) by mouth at bedtime.  Dispense: 60 tablet; Refill: 3 Increased- DULoxetine  (CYMBALTA ) 30 MG capsule; Take 1 capsule (30 mg total) by mouth 2 (two) times daily.  Dispense: 60 capsule; Refill: 3 Continue- hydrOXYzine  (ATARAX ) 50 MG tablet; Take 1 tablet (50 mg total) by mouth at bedtime as needed.  Dispense: 30 tablet; Refill: 3  2. PTSD (post-traumatic stress disorder)  Continue- busPIRone  (BUSPAR ) 15 MG tablet; Take 1 tablet (15 mg total) by mouth 2 (two) times daily.  Dispense: 60 tablet; Refill: 3 Increased- traZODone  (DESYREL ) 100 MG tablet; Take 2 tablets (200 mg total) by mouth at bedtime.  Dispense: 60 tablet; Refill: 3 Increased- DULoxetine  (CYMBALTA ) 30 MG capsule; Take 1 capsule (30 mg total) by mouth 2 (two) times daily.  Dispense: 60 capsule; Refill:  3  3. Mild depression  Continue- busPIRone  (BUSPAR ) 15 MG tablet; Take 1 tablet (15 mg total) by mouth 2 (two) times daily.  Dispense: 60 tablet; Refill: 3 Increased- traZODone  (DESYREL ) 100 MG tablet; Take 2 tablets (200 mg total) by mouth at bedtime.  Dispense: 60 tablet; Refill: 3 Increased- DULoxetine  (CYMBALTA ) 30 MG capsule; Take 1 capsule (30 mg total) by mouth 2 (two) times daily.  Dispense: 60 capsule; Refill: 3 Continue- hydrOXYzine  (ATARAX ) 50 MG tablet; Take 1 tablet (50 mg total) by mouth at bedtime as needed.  Dispense: 30 tablet; Refill: 3  Consent: Patient/Guardian gives verbal consent for treatment and assignment of benefits for services provided during this visit. Patient/Guardian expressed understanding and agreed to proceed.   Follow-up in 3 months Follow-up therapy Zane FORBES Bach, NP 12/09/2024, 8:45 AM

## 2024-12-26 ENCOUNTER — Telehealth: Payer: Self-pay

## 2024-12-26 NOTE — Telephone Encounter (Signed)
 Prior Authorization for patient (Wegovy  2.4MG /0.75ML auto-injectors) came through on cover my meds was submitted with last office notes and labs awaiting approval or denial.  XZB:[id number]

## 2024-12-26 NOTE — Telephone Encounter (Signed)
 [name] (Key: [id number]) Wegovy  2.4MG /0.75ML auto-injectors Form [employer] Medicare Part D Electronic Prior Authorization Form (2017 NCPDP) Created 4 days ago Sent to Plan 2 hours ago Plan Response 2 hours ago Submit Clinical Questions 2 hours ago Determination Favorable 40 minutes ago Your prior authorization for Wegovy  has been approved! More Info Personalized support and financial assistance may be available through the [employer] program. For more information, and to see program requirements, click on the More Info button to the right.  Message from plan: Request Reference Number: [id number]. WEGOVY  INJ 2.4MG  is approved through 12/28/2025. Your patient may now fill this prescription and it will be covered.. Authorization Expiration Date: December 28, 2025.

## 2025-01-11 ENCOUNTER — Ambulatory Visit
Admission: RE | Admit: 2025-01-11 | Discharge: 2025-01-11 | Disposition: A | Discharge status: Home / Self Care | Source: Ambulatory Visit | Attending: Hematology and Oncology | Admitting: Hematology and Oncology

## 2025-01-11 DIAGNOSIS — C50411 Malignant neoplasm of upper-outer quadrant of right female breast: Secondary | ICD-10-CM

## 2025-01-19 ENCOUNTER — Other Ambulatory Visit: Payer: Self-pay | Admitting: Cardiovascular Disease

## 2025-01-19 DIAGNOSIS — I2510 Atherosclerotic heart disease of native coronary artery without angina pectoris: Secondary | ICD-10-CM

## 2025-01-19 DIAGNOSIS — I350 Nonrheumatic aortic (valve) stenosis: Secondary | ICD-10-CM

## 2025-01-31 ENCOUNTER — Telehealth: Payer: Self-pay | Admitting: *Deleted

## 2025-01-31 ENCOUNTER — Telehealth: Payer: Self-pay

## 2025-01-31 MED ORDER — BREZTRI AEROSPHERE 160-9-4.8 MCG/ACT IN AERO: 2.0000 | INHALATION_SPRAY | Freq: Two times a day (BID) | RESPIRATORY_TRACT | 2 refills | Status: DC

## 2025-01-31 MED ORDER — SEMAGLUTIDE(0.25 OR 0.5MG/DOS) 2 MG/3ML ~~LOC~~ SOPN: 0.2500 mg | PEN_INJECTOR | SUBCUTANEOUS | 1 refills | Status: DC

## 2025-01-31 NOTE — Telephone Encounter (Signed)
 RTC to patient informed her that medication replacements have been sent.  Will need to come in to discuss the changes.  Patient transferred to the front office to schedule an appointment to come in.

## 2025-01-31 NOTE — Telephone Encounter (Signed)
 RTC from patient-now has [employer].  Patient states that the following meds are not covered by her new insurance would like to get something else prescribed.  Trelogy, Repatha  and Wegovy -her insurance does cover the Ozempic  -stated will need the highest dose as she is on the Wegovy  2.4  mg.

## 2025-01-31 NOTE — Telephone Encounter (Signed)
 RTC to patient message left that the clinics had returned her call.   Copied from CRM [id number]. Topic: Clinical - Prescription Issue >> Jan 30, 2025 10:08 AM Cherylann RAMAN wrote: Reason for CRM: Pt is unable to pay full price for her medication. She no longer has Medicaid and would like information on how to get Medicare Part D. Please advise. Patient can be contacted at [phone]. Patient also states that Social Services advised her about applying for an [employer].

## 2025-02-01 ENCOUNTER — Other Ambulatory Visit: Payer: Self-pay

## 2025-02-01 ENCOUNTER — Other Ambulatory Visit: Payer: Self-pay | Admitting: Student

## 2025-02-01 ENCOUNTER — Encounter: Payer: Self-pay | Admitting: Student

## 2025-02-01 ENCOUNTER — Ambulatory Visit: Admitting: Student

## 2025-02-01 VITALS — BP 130/61 | HR 94 | Temp 98.1°F | Ht 60.0 in | Wt 259.0 lb

## 2025-02-01 DIAGNOSIS — E119 Type 2 diabetes mellitus without complications: Secondary | ICD-10-CM

## 2025-02-01 DIAGNOSIS — J449 Chronic obstructive pulmonary disease, unspecified: Secondary | ICD-10-CM

## 2025-02-01 DIAGNOSIS — I2510 Atherosclerotic heart disease of native coronary artery without angina pectoris: Secondary | ICD-10-CM

## 2025-02-01 DIAGNOSIS — G8929 Other chronic pain: Secondary | ICD-10-CM

## 2025-02-01 LAB — GLUCOSE, CAPILLARY: Glucose-Capillary: 102 mg/dL — ABNORMAL HIGH (ref 70–99)

## 2025-02-01 LAB — POCT GLYCOSYLATED HEMOGLOBIN (HGB A1C): HbA1c, POC (controlled diabetic range): 6 % (ref 0.0–7.0)

## 2025-02-01 MED ORDER — FLUTICASONE-SALMETEROL 100-50 MCG/ACT IN AEPB: 1.0000 | INHALATION_SPRAY | Freq: Two times a day (BID) | RESPIRATORY_TRACT | 3 refills | Status: AC

## 2025-02-01 MED ORDER — UMECLIDINIUM BROMIDE 62.5 MCG/ACT IN AEPB: 1.0000 | INHALATION_SPRAY | Freq: Every day | RESPIRATORY_TRACT | 3 refills | Status: AC

## 2025-02-01 MED ORDER — DULOXETINE HCL 60 MG PO CPEP: 60.0000 mg | ORAL_CAPSULE | Freq: Every day | ORAL | Status: DC

## 2025-02-01 MED ORDER — OZEMPIC (2 MG/DOSE) 8 MG/3ML ~~LOC~~ SOPN: 2.0000 mg | PEN_INJECTOR | SUBCUTANEOUS | 3 refills | Status: AC

## 2025-02-01 NOTE — Assessment & Plan Note (Addendum)
 Chronic and stable.  On daily Trelegy with infrequent use of rescue inhaler.  Due to insurance changes switching from Trelegy to Advair  plus Spiriva today.  Orders:   fluticasone -salmeterol (ADVAIR ) 100-50 MCG/ACT AEPB; Inhale 1 puff into the lungs 2 (two) times daily.   umeclidinium bromide  (INCRUSE ELLIPTA ) 62.5 MCG/ACT AEPB; Inhale 1 puff into the lungs daily.

## 2025-02-01 NOTE — Assessment & Plan Note (Signed)
 SABRA

## 2025-02-01 NOTE — Progress Notes (Unsigned)
 " Patient name: [name] Date of birth: [birth year] Date of visit: 02/02/25  Subjective  Reason for visit: Follow-up, Hypertension, Diabetes, Depression (phq9), and Anxiety (Gad7 )  Discussed the use of AI scribe software for clinical note transcription with the patient, who gave verbal consent to proceed.  History of Present Illness   [name] is a 64 year old female who presents with medication management issues due to loss of Medicaid coverage.  Medication access and management - Unable to afford several chronic medications after losing Medicaid, specifically Trelegy, Wegovy , and Repatha . - Current insurance covers Advair  instead of Trelegy and Ozempic  instead of Wegovy ; no known alternative for Repatha . - Current medications: atorvastatin , duloxetine , ezetimibe , losartan /hydrochlorothiazide , metoprolol , trazodone , albuterol  inhaler, clopidogrel , buspirone . - Discontinued medications: amlodipine , gabapentin , metformin , Singulair , Voltaren  gel. - Uses Refresh eye drops for dry eyes.  Diabetes mellitus and obesity - Previously on Wegovy  2.4 mg weekly, currently taking Wegovy  2.7 mg weekly on Saturdays. - Interested in switching back to Ozempic , which is covered and previously used. - Diabetes is well controlled with recent A1c of 6 and blood glucose of 102. - Has lost over 100 pounds with GLP-1 therapy and dietary changes. - Weight reduced from over 360 pounds to approximately 258 pounds; satisfied with progress.  Hyperlipidemia and cardiovascular disease - Uses Repatha  for cholesterol management, prescribed by cardiology; last seen about a year ago. - Currently takes aspirin  and clopidogrel  per prior cardiologist; questions necessity of both. - History of coronary stents and prior pulmonary embolism; remains on clopidogrel . - Current lipid-lowering medications include atorvastatin  and ezetimibe .  Chronic sciatica and musculoskeletal pain - Severe chronic  sciatica for approximately three years, with pain radiating from back down the leg to ankle and foot. - Pain has recently worsened. - Tylenol  and ibuprofen  have not provided relief. - Previously underwent physical therapy and spinal injections. - Scheduled to see a neurologist. - History of knee replacements and back surgery for bulging discs, with persistent pain.       Show/hide medication list[1]   Objective  Today's Vitals   02/01/25 1333  BP: 130/61  Pulse: 94  Temp: 98.1 F (36.7 C)  TempSrc: Oral  SpO2: 93%  Weight: 259 lb (117.5 kg)  Height: 5' (1.524 m)  Body mass index is 50.58 kg/m.   Physical Exam Constitutional:      Appearance: Normal appearance.  Cardiovascular:     Rate and Rhythm: Normal rate and regular rhythm.     Heart sounds: Murmur (Systolic murmur loudest over RUSB) heard.  Pulmonary:     Effort: Pulmonary effort is normal. No respiratory distress.  Skin:    General: Skin is warm and dry.  Neurological:     Mental Status: She is alert.     Cranial Nerves: No facial asymmetry.  Psychiatric:        Mood and Affect: Affect normal.        Speech: Speech normal.        Behavior: Behavior normal.        Assessment & Plan Type 2 diabetes mellitus without complication, without long-term current use of insulin  (HCC) Lab Results  Component Value Date   HGBA1C 6.0 02/01/2025   HGBA1C 6.0 10/24/2024   HGBA1C 6.1 (A) 06/28/2024   Chronic with excellent glycemic control. Continue semaglutide , switching from Wegovy  to Ozempic  due to insurance change.  Orders:   POC Hbg A1C   Semaglutide , 2 MG/DOSE, (OZEMPIC , 2 MG/DOSE,) 8 MG/3ML SOPN; Inject 2 mg into  the skin once a week.   Chronic obstructive pulmonary disease, unspecified COPD type (HCC) Chronic and stable.  On daily Trelegy with infrequent use of rescue inhaler.  Due to insurance changes switching from Trelegy to Advair  plus Spiriva today.  Orders:   fluticasone -salmeterol (ADVAIR ) 100-50  MCG/ACT AEPB; Inhale 1 puff into the lungs 2 (two) times daily.   umeclidinium bromide  (INCRUSE ELLIPTA ) 62.5 MCG/ACT AEPB; Inhale 1 puff into the lungs daily.   Chronic right hip pain Chronic and somewhat function limiting.  She has had lumbar spine steroid injections before.  She has tried PT, activity modification, over-the-counter pain medicine, and systemic steroids.  I recommend returning to orthopedic surgery for repeat steroid injection.  I am increasing her Cymbalta  to 60 mg daily from 30 mg daily.  Orders:   DULoxetine  (CYMBALTA ) 60 MG capsule; Take 1 capsule (60 mg total) by mouth daily.   Coronary artery disease involving native coronary artery of native heart without angina pectoris S/p DES to distal RCA in 2010. Cardiac catheterization in 2015 showed 70-80% stenosis in the ostial RCA with patent distal RCA stent. Nuclear perfusion study in January 2024 showed no ischemia or infarction. Currently asymptomatic without angina.  Currently on atorvastatin  80 mg daily, losartan -HCTZ 50-12.5 mg daily, metoprolol  succinate 50 mg daily, and Plavix  75 mg daily as monotherapy.  Prior cardiology recommendation as of September 2025 was to continue dual antiplatelet therapy as she is deemed very high risk for restenosis.  I recommended restarting aspirin  today and discussing with cardiology whether she can discontinue one of her antiplatelets.     Return in about 3 months (around 05/01/2025) for chronic condition management, or sooner if needed.  Ozell Kung MD 02/02/2025, 2:00 PM         [1]  Outpatient Medications Prior to Visit  Medication Sig   acetaminophen  (TYLENOL ) 500 MG tablet Take 1,000 mg by mouth every 6 (six) hours as needed for moderate pain.   aspirin  EC 81 MG tablet Take 1 tablet (81 mg total) by mouth daily. Swallow whole.   atorvastatin  (LIPITOR ) 80 MG tablet Take 1 tablet (80 mg total) by mouth daily.   Blood Pressure Monitoring (BLOOD PRESSURE CUFF) MISC Please  follow insert instructions.   busPIRone  (BUSPAR ) 15 MG tablet Take 1 tablet (15 mg total) by mouth 2 (two) times daily.   clopidogrel  (PLAVIX ) 75 MG tablet TAKE 1 TABLET BY MOUTH IN THE MORNING   Evolocumab  (REPATHA  SURECLICK) 140 MG/ML SOAJ Inject 140 mg into the skin every 14 (fourteen) days.   ezetimibe  (ZETIA ) 10 MG tablet Take 1 tablet by mouth once daily   hydrOXYzine  (ATARAX ) 50 MG tablet Take 1 tablet (50 mg total) by mouth at bedtime as needed.   losartan -hydrochlorothiazide  (HYZAAR) 50-12.5 MG tablet Take 1 tablet by mouth daily.   metoprolol  succinate (TOPROL -XL) 50 MG 24 hr tablet Take 1 tablet (50 mg total) by mouth daily. Take with or immediately following a meal.TAKE 1 TABLET BY MOUTH ONCE DAILY WITH MEALS OR  IMMEDIATELY  FOLLOWING  A  MEAL   traZODone  (DESYREL ) 100 MG tablet Take 2 tablets (200 mg total) by mouth at bedtime.   VENTOLIN  HFA 108 (90 Base) MCG/ACT inhaler Inhale 2 puffs into the lungs every 6 (six) hours as needed for wheezing or shortness of breath.   [DISCONTINUED] amLODipine  (NORVASC ) 5 MG tablet Take 1 tablet (5 mg total) by mouth daily.   [DISCONTINUED] budesonide -glycopyrrolate -formoterol  (BREZTRI  AEROSPHERE) 160-9-4.8 MCG/ACT AERO inhaler Inhale 2 puffs into the  lungs 2 (two) times daily.   [DISCONTINUED] COMIRNATY syringe Inject 0.3 mLs into the muscle once.   [DISCONTINUED] diclofenac  Sodium (VOLTAREN ) 1 % GEL Apply 4 g topically 4 (four) times daily.   [DISCONTINUED] DULoxetine  (CYMBALTA ) 30 MG capsule Take 1 capsule (30 mg total) by mouth 2 (two) times daily.   [DISCONTINUED] gabapentin  (NEURONTIN ) 300 MG capsule TAKE 1 CAPSULE BY MOUTH IN THE MORNING, 1 CAPSULE IN THE AFTERNOON AND 3 CAPSULES AT BEDTIME   [DISCONTINUED] ketorolac  (ACULAR ) 0.5 % ophthalmic solution Place 1 drop into the right eye 4 (four) times daily.   [DISCONTINUED] metFORMIN  (GLUCOPHAGE -XR) 500 MG 24 hr tablet Take 1 tablet by mouth twice daily   [DISCONTINUED] montelukast  (SINGULAIR ) 10  MG tablet TAKE 1 TABLET BY MOUTH AT BEDTIME   [DISCONTINUED] ofloxacin (OCUFLOX) 0.3 % ophthalmic solution Place 1 drop into the right eye 4 (four) times daily.   [DISCONTINUED] prednisoLONE acetate (PRED FORTE) 1 % ophthalmic suspension Place 1 drop into the right eye 4 (four) times daily.   [DISCONTINUED] Semaglutide ,0.25 or 0.5MG /DOS, 2 MG/3ML SOPN Inject 0.25 mg into the skin once a week.   [DISCONTINUED] TWINRIX 720-20 ELU-MCG/ML injection Inject 1 mL into the muscle once.   No facility-administered medications prior to visit.   "

## 2025-02-01 NOTE — Patient Instructions (Signed)
 VISIT SUMMARY: Today we discussed your medication management issues due to loss of Medicaid coverage. We made adjustments to your medications for COPD, diabetes, and cholesterol management. We also addressed your chronic sciatica pain and cardiovascular health.  YOUR PLAN: MEDICATION MANAGEMENT AND RECONCILIATION: You are unable to afford Trelegy, Wegovy , and Repatha . We have made adjustments to your medications to fit your current insurance coverage. -Switched from Trelegy to Advair  and Incruse for COPD management. -Switched from Wegovy  to Ozempic  for weight management and diabetes control. -Discontinued metformin  due to well-controlled diabetes. -Continue clopidogrel  and aspirin  as per cardiologist's recommendation. -Schedule appointment with cardiologist to discuss long-term dual antiplatelet therapy.  CHRONIC OBSTRUCTIVE PULMONARY DISEASE: Your insurance covers Advair  and Incruse. You use an albuterol  inhaler once or twice a week. -Switched from Trelegy to Advair  and Incruse for COPD management. -Continue albuterol  inhaler as needed.  TYPE 2 DIABETES MELLITUS IN REMISSION: Your diabetes is in remission with an A1c of 6.0 and blood sugar at 102. You have lost over 100 pounds with Ozempic  and a healthy diet. -Continue Ozempic  for diabetes management. -Discontinued metformin  due to well-controlled diabetes.  SCIATICA WITH CHRONIC LOW BACK PAIN: You have chronic sciatica with severe pain radiating down your leg. Previous treatments have not provided relief. -Increased Cymbalta  to 60 mg daily by taking two 30 mg tablets. -Scheduled appointment with neurosurgeon for further evaluation and potential treatment options.  CORONARY ARTERY DISEASE WITH PRIOR STENTS: You have coronary artery disease with prior stents. You are on clopidogrel  and aspirin  as per your cardiologist's recommendation. -Continue clopidogrel  and aspirin  as per cardiologist's recommendation. -Schedule appointment with  cardiologist to discuss long-term dual antiplatelet therapy.  HYPERLIPIDEMIA: You are on atorvastatin  and ezetimibe  for cholesterol management. Repatha  is not covered by your insurance. -Continue atorvastatin  and ezetimibe  for cholesterol management. -Discuss Repatha  coverage with cardiologist.  Take your prescription medications as usual on the day of your doctor visit. Unless specifically instructed, there is no need to fast prior to laboratory blood testing.  Bring all of the medications that you take (including over the counter medications and supplements) with you to every clinic visit.  This after visit summary is an important review of tests, referrals, and medication changes that were discussed during your visit. If you have questions or concerns, call [phone]. Outside of clinic business hours, call the main hospital at [phone] and ask the operator for the on-call internal medicine resident.   Ozell Kung MD 02/01/2025, 2:35 PM

## 2025-02-01 NOTE — Assessment & Plan Note (Deleted)
 SABRA

## 2025-02-01 NOTE — Assessment & Plan Note (Addendum)
 Lab Results  Component Value Date   HGBA1C 6.0 02/01/2025   HGBA1C 6.0 10/24/2024   HGBA1C 6.1 (A) 06/28/2024   Chronic with excellent glycemic control. Continue semaglutide , switching from Wegovy  to Ozempic  due to insurance change.  Orders:   POC Hbg A1C   Semaglutide , 2 MG/DOSE, (OZEMPIC , 2 MG/DOSE,) 8 MG/3ML SOPN; Inject 2 mg into the skin once a week.

## 2025-02-01 NOTE — Assessment & Plan Note (Signed)
 Orders:    DULoxetine  (CYMBALTA ) 60 MG capsule; Take 1 capsule (60 mg total) by mouth daily.

## 2025-02-01 NOTE — Progress Notes (Unsigned)
 Internal Medicine Clinic Attending  Case discussed with the resident at the time of the visit.  We reviewed the resident's history and exam and pertinent patient test results.  I agree with the assessment, diagnosis, and plan of care documented in the resident's note.

## 2025-02-02 ENCOUNTER — Other Ambulatory Visit: Payer: Self-pay | Admitting: Student

## 2025-02-02 DIAGNOSIS — E119 Type 2 diabetes mellitus without complications: Secondary | ICD-10-CM

## 2025-02-03 ENCOUNTER — Encounter (HOSPITAL_COMMUNITY): Payer: Self-pay | Admitting: Psychiatry

## 2025-02-03 ENCOUNTER — Other Ambulatory Visit: Payer: Self-pay

## 2025-02-03 ENCOUNTER — Telehealth (HOSPITAL_COMMUNITY): Admitting: Psychiatry

## 2025-02-03 DIAGNOSIS — F4310 Post-traumatic stress disorder, unspecified: Secondary | ICD-10-CM

## 2025-02-03 DIAGNOSIS — G8929 Other chronic pain: Secondary | ICD-10-CM

## 2025-02-03 DIAGNOSIS — F411 Generalized anxiety disorder: Secondary | ICD-10-CM

## 2025-02-03 DIAGNOSIS — F32A Depression, unspecified: Secondary | ICD-10-CM

## 2025-02-03 MED ORDER — DULOXETINE HCL 60 MG PO CPEP: 60.0000 mg | ORAL_CAPSULE | Freq: Every day | ORAL | 3 refills | Status: AC

## 2025-02-03 MED ORDER — HYDROXYZINE HCL 50 MG PO TABS: 50.0000 mg | ORAL_TABLET | Freq: Every evening | ORAL | 3 refills | Status: AC | PRN

## 2025-02-03 MED ORDER — TRAZODONE HCL 100 MG PO TABS: 200.0000 mg | ORAL_TABLET | Freq: Every day | ORAL | 3 refills | Status: AC

## 2025-02-03 MED ORDER — BUSPIRONE HCL 15 MG PO TABS: 15.0000 mg | ORAL_TABLET | Freq: Two times a day (BID) | ORAL | 3 refills | Status: AC

## 2025-02-03 NOTE — Progress Notes (Signed)
 BH MD/PA/NP OP Progress Note Virtual Visit via Video Note  I connected with [name] on 02/03/25 at 10:30 AM EST by a video enabled telemedicine application and verified that I am speaking with the correct person using two identifiers.  Location: Patient: Home Provider: Clinic   I discussed the limitations of evaluation and management by telemedicine and the availability of in person appointments. The patient expressed understanding and agreed to proceed.  I provided 30 minutes of non-face-to-face time during this encounter.   02/03/2025 10:50 AM [name]  MRN:  [id number]  Chief Complaint: I can't afford some of my medications  HPI:  64 year old female seen today for follow-up psychiatric evaluation.  She has a psychiatric history of anxiety, depression, and PTSD.  Currently she is being managed on BuSpar  15 mg 3 times daily (noting that she takes it twice daily), Cymbalta  30 mg twice daily, hydroxyzine  50 mg nightly as needed, and trazodone  200 mg nightly as needed.  She notes the medications effective in managing her psychiatric condition.   Today she was well-groomed, pleasant, cooperative, and engaged in conversation.  She informed clinical research associate that she has been having problems getting some of her non psychiatric medications. She reports that some of her medications are 3000 $ after her insurance pays for it.  Provider informed patient that [place] pharmacy [phone] or [facility] [phone] may be cheaper.  She endorsed understanding and notes that she will call.    Since her last visit she notes that her mood continues to be stable and reports that she has minimal anxiety and depression. Today provider conducted a GAD-7 and patient scored a 1, at her last visit she scored a 19.  Provider also conducted PHQ-9 and patient scored an 2, at her last visit she scored a 8.  She endorsed adequate appetite.  She informed clinical research associate that she has lost  10 pounds in the last few months with Ozempic .  She informed clinical research associate that she sleeps approximately 5 hours and wakes up through the night.  She reports that the increase in trazodone  was somewhat effective today she denies SI/HI/VAH, mania, paranoia.    Patient informed clinical research associate that she continues to have sciatic pain.  She quantifies her pain today as 2/10.  No medication changes made today.  She will continue her medication prescribed.  No other concerns noted at this time.    Visit Diagnosis:    ICD-10-CM   1. GAD (generalized anxiety disorder)  F41.1 hydrOXYzine  (ATARAX ) 50 MG tablet    traZODone  (DESYREL ) 100 MG tablet    busPIRone  (BUSPAR ) 15 MG tablet    2. Mild depression  F32.A hydrOXYzine  (ATARAX ) 50 MG tablet    traZODone  (DESYREL ) 100 MG tablet    busPIRone  (BUSPAR ) 15 MG tablet    3. PTSD (post-traumatic stress disorder)  F43.10 traZODone  (DESYREL ) 100 MG tablet    busPIRone  (BUSPAR ) 15 MG tablet    4. Chronic right hip pain  M25.551 DULoxetine  (CYMBALTA ) 60 MG capsule   G89.29              Past Psychiatric History: anxiety, depression, and PTSD  Past Medical History:  Past Medical History:  Diagnosis Date   Anemia    Anxiety    Aortic valve stenosis, severe    Arthritis    PAIN AND SEVERE OA LEFT KNEE ; S/P RIGHT TKA ON 02/03/12; HAS LOWER BACK PAIN-UNABLE TO STAND MORE THAN 10 MIN; ARTHRITIS ALL OVER  Asthma    Blood transfusion    2013[facility]   Breast cancer in female [facility])    Right   CAD (coronary artery disease)    Cath 2010 with DES x 1 RCA-- PT'S CARDIOLOGIST IS DR. MCALHANY   Chronic diastolic congestive heart failure (HCC)    COPD (chronic obstructive pulmonary disease) (HCC)    Mild   COPD exacerbation (HCC) 11/16/2022   Depression    Diabetes mellitus DIAGNOSED IN2010   Dyspnea    with much ambulation   Eczema    on back   GERD (gastroesophageal reflux disease)    Headache    migraines younger- rare now 02/07/21   Heart murmur    no  current problems   History of hiatal hernia    History of kidney stones    passed or blasted   History of UTI 12/05/2022   Hyperlipidemia    Hypertension    Morbid obesity with body mass index of 60.0-69.9 in adult [facility])    Myocardial infarction (HCC)    PT THINKS SHE WAS DX WITH MI AT THE TIME OF HEART STENTING   Neuromuscular disorder (HCC)    bilateral arm/hands   Normocytic anemia 02/08/2012   Oxygen dependent    uses 3L oxygen night/prn   Personal history of radiation therapy    Pneumonia    Pulmonary embolism (HCC) 02/08/2012   S/P RT TOTAL KNEE ON 02/03/12--ON 02/08/12--DEVELOPED ACUTE SOB AND CHEST PAIN--AND DIAGNOSED WITH  PULMONARY EMBOLUS AND PNEUMONIA   Restless leg syndrome    Sleep apnea    Type 2 diabetes mellitus without complication, without long-term current use of insulin  (HCC) 02/08/2012   Uterine fibroid    NO PROBLEMS AT PRESENT FROM THE FIBROIDS-STATES SHE IS POST MENOPAUSAL-LAST MENSES 2010 EXCEPT FOR EPISODE THIS YR OF BLEEDING RELATED TO FIBROIDS.   Weakness    BOTH HANDS - S/P BILATERAL CARPAL TUNNEL RELEASE--BUT STILL HAS WEAKNESS--OFTEN DROPS THINGS    Past Surgical History:  Procedure Laterality Date   BACK SURGERY  02/11/2021   Dr Lucilla   BICEPT TENODESIS Right 09/15/2023   Procedure: BICEPS TENODESIS;  Surgeon: Addie Cordella Hamilton, MD;  [facility] OR;  Service: Orthopedics;  Laterality: Right;   BREAST BIOPSY Right 06/04/2018   BREAST LUMPECTOMY Right 06/2018   BREAST LUMPECTOMY WITH RADIOACTIVE SEED AND SENTINEL LYMPH NODE BIOPSY Right 07/19/2018   Procedure: BREAST LUMPECTOMY WITH RADIOACTIVE SEED AND SENTINEL LYMPH NODE BIOPSY;  Surgeon: Ethyl Lenis, MD;  Location: [facility] OR;  Service: General;  Laterality: Right;   CARPAL TUNNEL RELEASE     Bilateral   CHOLECYSTECTOMY     COLONOSCOPY  2010   CORONARY ANGIOPLASTY     2010 has stent in place   CYSTOSCOPY W/ RETROGRADES Right 09/21/2013   Procedure: CYSTOSCOPY WITH RIGHT RETROGRADE PYELOGRAM  RIGHT DOUBLE J STENT ;  Surgeon: Donnice Gwenyth Brooks, MD;  Location: [facility] ORS;  Service: Urology;  Laterality: Right;   CYSTOSCOPY W/ URETERAL STENT PLACEMENT Right 11/16/2022   Procedure: CYSTOSCOPY WITH RETROGRADE PYELOGRAM/URETERAL STENT PLACEMENT;  Surgeon: Lovie Arlyss CROME, MD;  Location: [facility] OR;  Service: Urology;  Laterality: Right;   CYSTOSCOPY WITH RETROGRADE PYELOGRAM, URETEROSCOPY AND STENT PLACEMENT Right 01/13/2023   Procedure: CYSTOSCOPY WITH RETROGRADE PYELOGRAM, URETEROSCOPY AND STENT EXCHANGE;  Surgeon: Roseann Adine PARAS., MD;  Location: [facility];  Service: Urology;  Laterality: Right;   CYSTOSCOPY WITH URETEROSCOPY AND STENT PLACEMENT Right 10/25/2013   Procedure: CYSTOSCOPY RIGHT URETEROSCOPY HOLMIUM LASER LITHO AND STENT  PLACEMENT;  Surgeon: Donnice Gwenyth Brooks, MD;  Location: [facility];  Service: Urology;  Laterality: Right;   HERNIA REPAIR  1995   umbilical ,   HOLMIUM LASER APPLICATION Right 01/13/2023   Procedure: HOLMIUM LASER APPLICATION;  Surgeon: Roseann Adine PARAS., MD;  Location: [facility];  Service: Urology;  Laterality: Right;   INTRAOPERATIVE TRANSESOPHAGEAL ECHOCARDIOGRAM N/A 12/12/2014   Procedure: INTRAOPERATIVE TRANSESOPHAGEAL ECHOCARDIOGRAM;  Surgeon: Lonni JONETTA Cash, MD;  Location[facility] OR;  Service: Cardiovascular;  Laterality: N/A;   KNEE ARTHROPLASTY  02/03/2012   Procedure: COMPUTER ASSISTED TOTAL KNEE ARTHROPLASTY;  Surgeon: Lonni CINDERELLA Poli, MD;  Location: [facility] OR;  Service: Orthopedics;  Laterality: Right;  Right total knee arthroplasty   LEFT AND RIGHT HEART CATHETERIZATION WITH CORONARY ANGIOGRAM N/A 03/17/2013   Procedure: LEFT AND RIGHT HEART CATHETERIZATION WITH CORONARY ANGIOGRAM;  Surgeon: Lonni JONETTA Cash, MD;  Location[facility] CATH LAB;  Service: Cardiovascular;  Laterality: N/A;   LEFT AND RIGHT HEART CATHETERIZATION WITH CORONARY/GRAFT ANGIOGRAM N/A 09/14/2014   Procedure: LEFT AND RIGHT HEART CATHETERIZATION WITH EL BILE;  Surgeon: Lonni JONETTA Cash, MD;  Location: [facility] CATH LAB;  Service: Cardiovascular;  Laterality: N/A;   REVERSE SHOULDER ARTHROPLASTY Left 03/06/2022   Procedure: LEFT SHOULDER REPLACEMENT APPLICATION OF WOUND VAC;  Surgeon: Addie Cordella Hamilton, MD;  Location: [facility] OR;  Service: Orthopedics;  Laterality: Left;   REVERSE SHOULDER ARTHROPLASTY Right 09/15/2023   Procedure: REVERSE SHOULDER ARTHROPLASTY;  Surgeon: Addie Cordella Hamilton, MD;  Location: [facility] OR;  Service: Orthopedics;  Laterality: Right;   TEE WITHOUT CARDIOVERSION N/A 03/14/2013   Procedure: TRANSESOPHAGEAL ECHOCARDIOGRAM (TEE);  Surgeon: Redell GORMAN Shallow, MD;  Location: [facility] ENDOSCOPY;  Service: Cardiovascular;  Laterality: N/A;   TEE WITHOUT CARDIOVERSION N/A 11/14/2014   Procedure: TRANSESOPHAGEAL ECHOCARDIOGRAM (TEE);  Surgeon: Redell GORMAN Shallow, MD;  Location: [facility] ENDOSCOPY;  Service: Cardiovascular;  Laterality: N/A;   TONSILLECTOMY     maybe as a child- does not know   TOTAL KNEE ARTHROPLASTY  09/10/2012   Procedure: TOTAL KNEE ARTHROPLASTY;  Surgeon: Lonni CINDERELLA Poli, MD;  Location: [facility];  Service: Orthopedics;  Laterality: Left;   TOTAL KNEE REVISION Right 07/15/2013   Procedure: REVISION ARTHROPLASTY RIGHT KNEE;  Surgeon: Lonni CINDERELLA Poli, MD;  Location: [facility];  Service: Orthopedics;  Laterality: Right;   TRANSCATHETER AORTIC VALVE REPLACEMENT, TRANSFEMORAL N/A 12/12/2014   Procedure: TRANSCATHETER AORTIC VALVE REPLACEMENT, TRANSFEMORAL;  Surgeon: Lonni JONETTA Cash, MD;  Location: [facility] OR;  Service: Cardiovascular;  Laterality: N/A;   TRIGGER FINGER RELEASE  09/10/2012   Procedure: RELEASE TRIGGER FINGER/A-1 PULLEY;  Surgeon: Lonni CINDERELLA Poli, MD;  Location: [facility];  Service: Orthopedics;  Laterality: Right;  Right Ring Finger   TUBAL LIGATION      Family Psychiatric History: Mother alcohol use and brother intellectual delay   Family History:  Family History  Problem Relation Age of Onset    Breast cancer Mother 31 - 56       stage IV at diagnosis   Emphysema Mother        smoked   Heart disease Mother    COPD Father        smoked   Asthma Father    Heart disease Father    Cancer Brother        Sinus   Colon cancer Neg Hx    Esophageal cancer Neg Hx    Inflammatory bowel disease Neg Hx    Liver disease Neg Hx    Pancreatic cancer Neg Hx  Rectal cancer Neg Hx    Stomach cancer Neg Hx     Social History:  Social History   Socioeconomic History   Marital status: Married    Spouse name: Not on file   Number of children: 2   Years of education: Not on file   Highest education level: Bachelor's degree (e.g., BA, AB, BS)  Occupational History   Occupation: Disabled  Tobacco Use   Smoking status: Former    Current packs/day: 0.00    Average packs/day: 1.5 packs/day for 30.0 years (45.0 ttl pk-yrs)    Types: Cigarettes    Start date: 12/29/1970    Quit date: 12/29/2000    Years since quitting: 24.1   Smokeless tobacco: Never  Vaping Use   Vaping status: Never Used  Substance and Sexual Activity   Alcohol use: Not Currently   Drug use: Not Currently    Types: Marijuana    Comment: CBD oils through vape shops   Sexual activity: Not Currently    Birth control/protection: Surgical, Post-menopausal    Comment: tubal ligation  Other Topics Concern   Not on file  Social History Narrative   Not on file   Social Drivers of Health   Tobacco Use: Medium Risk (02/03/2025)   Patient History    Smoking Tobacco Use: Former    Smokeless Tobacco Use: Never    Passive Exposure: Not on Actuary Strain: Low Risk (01/31/2025)   Overall Financial Resource Strain (CARDIA)    Difficulty of Paying Living Expenses: Not very hard  Food Insecurity: No Food Insecurity (01/31/2025)   Epic    Worried About Radiation Protection Practitioner of Food in the Last Year: Never true    Ran Out of Food in the Last Year: Never true  Transportation Needs: No Transportation Needs (01/31/2025)    Epic    Lack of Transportation (Medical): No    Lack of Transportation (Non-Medical): No  Physical Activity: Insufficiently Active (01/31/2025)   Exercise Vital Sign    Days of Exercise per Week: 2 days    Minutes of Exercise per Session: 10 min  Stress: Stress Concern Present (01/31/2025)   [employer] of Occupational Health - Occupational Stress Questionnaire    Feeling of Stress: Rather much  Social Connections: Moderately Integrated (01/31/2025)   Social Connection and Isolation Panel    Frequency of Communication with Friends and Family: More than three times a week    Frequency of Social Gatherings with Friends and Family: Twice a week    Attends Religious Services: 1 to 4 times per year    Active Member of [employer] or Organizations: No    Attends Engineer, Structural: Not on file    Marital Status: Married  Depression (PHQ2-9): Low Risk (02/03/2025)   Depression (PHQ2-9)    PHQ-2 Score: 2  Recent Concern: Depression (PHQ2-9) - Medium Risk (12/09/2024)   Depression (PHQ2-9)    PHQ-2 Score: 8  Alcohol Screen: Low Risk (02/18/2024)   Alcohol Screen    Last Alcohol Screening Score (AUDIT): 0  Housing: High Risk (01/31/2025)   Epic    Unable to Pay for Housing in the Last Year: Yes    Number of Times Moved in the Last Year: 0    Homeless in the Last Year: Not on file  Utilities: Not At Risk (06/29/2024)   Epic    Threatened with loss of utilities: No  Health Literacy: Adequate Health Literacy (01/18/2024)   B1300 Health Literacy  Frequency of need for help with medical instructions: Never    Allergies: No Known Allergies  Metabolic Disorder Labs: Lab Results  Component Value Date   HGBA1C 6.0 02/01/2025   MPG 157 09/09/2023   MPG 143 01/08/2023   No results found for: PROLACTIN Lab Results  Component Value Date   CHOL 170 09/07/2024   TRIG 126 09/07/2024   HDL 61 09/07/2024   CHOLHDL 2.8 09/07/2024   VLDL 20 12/28/2015   LDLCALC 87 09/07/2024   LDLCALC  101 (H) 12/25/2022   Lab Results  Component Value Date   TSH 2.820 09/25/2020   TSH 2.918 09/21/2013    Therapeutic Level Labs: No results found for: LITHIUM No results found for: VALPROATE No results found for: CBMZ  Current Medications: Current Outpatient Medications  Medication Sig Dispense Refill   acetaminophen  (TYLENOL ) 500 MG tablet Take 1,000 mg by mouth every 6 (six) hours as needed for moderate pain.     aspirin  EC 81 MG tablet Take 1 tablet (81 mg total) by mouth daily. Swallow whole. 30 tablet 12   atorvastatin  (LIPITOR ) 80 MG tablet Take 1 tablet (80 mg total) by mouth daily. 90 tablet 3   Blood Pressure Monitoring (BLOOD PRESSURE CUFF) MISC Please follow insert instructions. 1 each 0   busPIRone  (BUSPAR ) 15 MG tablet Take 1 tablet (15 mg total) by mouth 2 (two) times daily. 60 tablet 3   clopidogrel  (PLAVIX ) 75 MG tablet TAKE 1 TABLET BY MOUTH IN THE MORNING 90 tablet 0   DULoxetine  (CYMBALTA ) 60 MG capsule Take 1 capsule (60 mg total) by mouth daily. 30 capsule 3   Evolocumab  (REPATHA  SURECLICK) 140 MG/ML SOAJ Inject 140 mg into the skin every 14 (fourteen) days. 6 mL 3   ezetimibe  (ZETIA ) 10 MG tablet Take 1 tablet by mouth once daily 90 tablet 3   fluticasone -salmeterol (ADVAIR ) 100-50 MCG/ACT AEPB Inhale 1 puff into the lungs 2 (two) times daily. 180 each 3   hydrOXYzine  (ATARAX ) 50 MG tablet Take 1 tablet (50 mg total) by mouth at bedtime as needed. 30 tablet 3   losartan -hydrochlorothiazide  (HYZAAR) 50-12.5 MG tablet Take 1 tablet by mouth daily. 90 tablet 3   metoprolol  succinate (TOPROL -XL) 50 MG 24 hr tablet Take 1 tablet (50 mg total) by mouth daily. Take with or immediately following a meal.TAKE 1 TABLET BY MOUTH ONCE DAILY WITH MEALS OR  IMMEDIATELY  FOLLOWING  A  MEAL 90 tablet 2   Semaglutide , 2 MG/DOSE, (OZEMPIC , 2 MG/DOSE,) 8 MG/3ML SOPN Inject 2 mg into the skin once a week. 9 mL 3   traZODone  (DESYREL ) 100 MG tablet Take 2 tablets (200 mg total) by  mouth at bedtime. 60 tablet 3   umeclidinium bromide  (INCRUSE ELLIPTA ) 62.5 MCG/ACT AEPB Inhale 1 puff into the lungs daily. 90 each 3   VENTOLIN  HFA 108 (90 Base) MCG/ACT inhaler Inhale 2 puffs into the lungs every 6 (six) hours as needed for wheezing or shortness of breath. 18 g 3   No current facility-administered medications for this visit.     Musculoskeletal: Strength & Muscle Tone: within normal limits and Telehealth visit Gait & Station: normal, Telehealth Patient leans: N/A  Psychiatric Specialty Exam: Review of Systems  Last menstrual period 02/03/2012.There is no height or weight on file to calculate BMI.  General Appearance: Well Groomed  Eye Contact:  Good  Speech:  Clear and Coherent and Normal Rate  Volume:  Normal  Mood:  Euthymic  Affect:  Appropriate and  Congruent  Thought Process:  Coherent, Goal Directed, and Linear  Orientation:  Full (Time, Place, and Person)  Thought Content: WDL and Logical   Suicidal Thoughts:  No  Homicidal Thoughts:  No  Memory:  Immediate;   Good Recent;   Good Remote;   Good  Judgement:  Good  Insight:  Good  Psychomotor Activity:  Normal  Concentration:  Concentration: Good and Attention Span: Good  Recall:  Good  Fund of Knowledge: Good  Language: Good  Akathisia:  No  Handed:  Right  AIMS (if indicated): not done  Assets:  Communication Skills Desire for Improvement Financial Resources/Insurance Housing Leisure Time Physical Health Social Support  ADL's:  Intact  Cognition: WNL  Sleep:  Fair   Screenings: GAD-7    Flowsheet Row Video Visit from 02/03/2025 in [facility] Office Visit from 02/01/2025 in [facility] Of [place]. [facility] Video Visit from 12/09/2024 in [facility] Video Visit from 09/16/2024 in [facility] Video Visit from 07/29/2024 in [facility]  Total  GAD-7 Score 1 5 19 2 2    PHQ2-9    Flowsheet Row Video Visit from 02/03/2025 in [facility] Office Visit from 02/01/2025 in [facility] Of [place]. [facility] Video Visit from 12/09/2024 in [facility] Video Visit from 09/16/2024 in [facility] Office Visit from 09/07/2024 in [facility] - A Dept Of [place]. [facility]  PHQ-2 Total Score 0 0 2 0 0  PHQ-9 Total Score 2 1 8 1  --   Flowsheet Row UC from 07/29/2024 in [facility] [place] [facility]) Most recent reading at 07/29/2024  5:11 PM Video Visit from 07/29/2024 in [facility] Most recent reading at 07/29/2024  1:48 PM Admission (Discharged) from 09/15/2023 in [facility] Most recent reading at 09/15/2023  6:06 AM  C-SSRS RISK CATEGORY No Risk No Risk No Risk     Assessment and Plan: Patient reports that her anxiety and depression are well-managed.  She notes that her pain has somewhat improved with Cymbalta .  Her sleep continues to be fair.  She notes that she is able to cope.No medication changes made today.  She will continue her medication prescribed. 1. GAD (generalized anxiety disorder)  Continue- busPIRone  (BUSPAR ) 15 MG tablet; Take 1 tablet (15 mg total) by mouth 2 (two) times daily.  Dispense: 60 tablet; Refill: 3 Continue- traZODone  (DESYREL ) 100 MG tablet; Take 2 tablets (200 mg total) by mouth at bedtime.  Dispense: 60 tablet; Refill: 3 Continue- DULoxetine  (CYMBALTA ) 30 MG capsule; Take 1 capsule (30 mg total) by mouth 2 (two) times daily.  Dispense: 60 capsule; Refill: 3 Continue- hydrOXYzine  (ATARAX ) 50 MG tablet; Take 1 tablet (50 mg total) by mouth at bedtime as needed.  Dispense: 30 tablet; Refill: 3  2. PTSD (post-traumatic stress disorder)  Continue- busPIRone  (BUSPAR ) 15 MG tablet; Take 1 tablet  (15 mg total) by mouth 2 (two) times daily.  Dispense: 60 tablet; Refill: 3 Continue- traZODone  (DESYREL ) 100 MG tablet; Take 2 tablets (200 mg total) by mouth at bedtime.  Dispense: 60 tablet; Refill: 3 Continue- DULoxetine  (CYMBALTA ) 30 MG capsule; Take 1 capsule (30 mg total) by mouth 2 (two) times daily.  Dispense: 60  capsule; Refill: 3  3. Mild depression  Continue- busPIRone  (BUSPAR ) 15 MG tablet; Take 1 tablet (15 mg total) by mouth 2 (two) times daily.  Dispense: 60 tablet; Refill: 3 Continue- traZODone  (DESYREL ) 100 MG tablet; Take 2 tablets (200 mg total) by mouth at bedtime.  Dispense: 60 tablet; Refill: 3 Continue- DULoxetine  (CYMBALTA ) 30 MG capsule; Take 1 capsule (30 mg total) by mouth 2 (two) times daily.  Dispense: 60 capsule; Refill: 3 Continue- hydrOXYzine  (ATARAX ) 50 MG tablet; Take 1 tablet (50 mg total) by mouth at bedtime as needed.  Dispense: 30 tablet; Refill: 3  Consent: Patient/Guardian gives verbal consent for treatment and assignment of benefits for services provided during this visit. Patient/Guardian expressed understanding and agreed to proceed.   Follow-up in 3 months Follow-up therapy Zane FORBES Bach, NP 02/03/2025, 10:50 AM

## 2025-03-30 ENCOUNTER — Telehealth (HOSPITAL_COMMUNITY): Admitting: Psychiatry

## 2025-07-25 ENCOUNTER — Telehealth

## 2025-08-01 ENCOUNTER — Telehealth
